# Patient Record
Sex: Male | Born: 1960
Health system: Southern US, Community
[De-identification: ages and names within clinical notes are randomized; demographics above are authoritative.]

## PROBLEM LIST (undated history)

## (undated) DIAGNOSIS — I4892 Unspecified atrial flutter: Secondary | ICD-10-CM

## (undated) DIAGNOSIS — N183 Chronic kidney disease, stage 3 unspecified: Secondary | ICD-10-CM

## (undated) DIAGNOSIS — D649 Anemia, unspecified: Secondary | ICD-10-CM

## (undated) DIAGNOSIS — E785 Hyperlipidemia, unspecified: Secondary | ICD-10-CM

## (undated) DIAGNOSIS — I2510 Atherosclerotic heart disease of native coronary artery without angina pectoris: Secondary | ICD-10-CM

## (undated) DIAGNOSIS — E669 Obesity, unspecified: Secondary | ICD-10-CM

## (undated) DIAGNOSIS — K219 Gastro-esophageal reflux disease without esophagitis: Secondary | ICD-10-CM

## (undated) DIAGNOSIS — R803 Bence Jones proteinuria: Principal | ICD-10-CM

## (undated) DIAGNOSIS — I10 Essential (primary) hypertension: Secondary | ICD-10-CM

## (undated) DIAGNOSIS — M109 Gout, unspecified: Secondary | ICD-10-CM

## (undated) DIAGNOSIS — IMO0002 Reserved for concepts with insufficient information to code with codable children: Secondary | ICD-10-CM

## (undated) DIAGNOSIS — L0291 Cutaneous abscess, unspecified: Secondary | ICD-10-CM

## (undated) DIAGNOSIS — K529 Noninfective gastroenteritis and colitis, unspecified: Secondary | ICD-10-CM

## (undated) DIAGNOSIS — I219 Acute myocardial infarction, unspecified: Secondary | ICD-10-CM

## (undated) DIAGNOSIS — I499 Cardiac arrhythmia, unspecified: Secondary | ICD-10-CM

## (undated) DIAGNOSIS — G4730 Sleep apnea, unspecified: Secondary | ICD-10-CM

## (undated) DIAGNOSIS — M1990 Unspecified osteoarthritis, unspecified site: Secondary | ICD-10-CM

## (undated) DIAGNOSIS — T8090XA Unspecified complication following infusion and therapeutic injection, initial encounter: Secondary | ICD-10-CM

## (undated) DIAGNOSIS — R600 Localized edema: Secondary | ICD-10-CM

## (undated) DIAGNOSIS — L03119 Cellulitis of unspecified part of limb: Secondary | ICD-10-CM

## (undated) HISTORY — DX: Sleep apnea, unspecified: G47.30

## (undated) HISTORY — DX: Obesity, unspecified: E66.9

## (undated) HISTORY — PX: INCISION AND DRAINAGE ABSCESS ANAL: SUR669

## (undated) HISTORY — PX: WRIST SURGERY: SHX841

## (undated) HISTORY — DX: Reserved for concepts with insufficient information to code with codable children: IMO0002

## (undated) HISTORY — DX: Atherosclerotic heart disease of native coronary artery without angina pectoris: I25.10

## (undated) HISTORY — DX: Localized edema: R60.0

## (undated) HISTORY — DX: Chronic kidney disease, stage 3 unspecified: N18.30

## (undated) HISTORY — DX: Bence Jones proteinuria: R80.3

## (undated) HISTORY — PX: ABSCESS DRAINAGE: SHX1119

## (undated) HISTORY — PX: CARDIAC CATHETERIZATION: SHX172

## (undated) HISTORY — DX: Chronic kidney disease, stage 3 (moderate): N18.3

## (undated) HISTORY — DX: Unspecified complication following infusion and therapeutic injection, initial encounter: T80.90XA

## (undated) HISTORY — DX: Hyperlipidemia, unspecified: E78.5

## (undated) HISTORY — DX: Essential (primary) hypertension: I10

## (undated) HISTORY — DX: Cellulitis of unspecified part of limb: L03.119

---

## 1998-06-02 DIAGNOSIS — I219 Acute myocardial infarction, unspecified: Secondary | ICD-10-CM

## 1998-06-02 HISTORY — DX: Acute myocardial infarction, unspecified: I21.9

## 2000-04-03 ENCOUNTER — Encounter: Admission: RE | Admit: 2000-04-03 | Discharge: 2000-07-02 | Payer: Self-pay | Admitting: Internal Medicine

## 2000-11-02 ENCOUNTER — Inpatient Hospital Stay (HOSPITAL_COMMUNITY): Admission: AD | Admit: 2000-11-02 | Discharge: 2000-11-09 | Payer: Self-pay | Admitting: Family Medicine

## 2000-11-05 ENCOUNTER — Encounter: Payer: Self-pay | Admitting: Family Medicine

## 2000-11-05 ENCOUNTER — Encounter: Payer: Self-pay | Admitting: Cardiology

## 2000-11-17 ENCOUNTER — Ambulatory Visit (HOSPITAL_COMMUNITY): Admission: RE | Admit: 2000-11-17 | Discharge: 2000-11-17 | Payer: Self-pay | Admitting: Cardiology

## 2000-11-30 ENCOUNTER — Encounter (HOSPITAL_COMMUNITY): Admission: RE | Admit: 2000-11-30 | Discharge: 2000-12-30 | Payer: Self-pay | Admitting: Cardiology

## 2001-01-01 ENCOUNTER — Encounter (HOSPITAL_COMMUNITY): Admission: RE | Admit: 2001-01-01 | Discharge: 2001-01-31 | Payer: Self-pay | Admitting: Cardiology

## 2001-02-03 ENCOUNTER — Encounter (HOSPITAL_COMMUNITY): Admission: RE | Admit: 2001-02-03 | Discharge: 2001-03-05 | Payer: Self-pay | Admitting: Cardiology

## 2001-03-08 ENCOUNTER — Encounter (HOSPITAL_COMMUNITY): Admission: RE | Admit: 2001-03-08 | Discharge: 2001-04-07 | Payer: Self-pay | Admitting: Cardiology

## 2001-04-02 ENCOUNTER — Inpatient Hospital Stay (HOSPITAL_COMMUNITY): Admission: RE | Admit: 2001-04-02 | Discharge: 2001-04-05 | Payer: Self-pay | Admitting: General Surgery

## 2003-01-26 ENCOUNTER — Ambulatory Visit (HOSPITAL_COMMUNITY): Admission: RE | Admit: 2003-01-26 | Discharge: 2003-01-26 | Payer: Self-pay | Admitting: Cardiology

## 2003-01-27 ENCOUNTER — Inpatient Hospital Stay (HOSPITAL_COMMUNITY): Admission: AD | Admit: 2003-01-27 | Discharge: 2003-02-05 | Payer: Self-pay | Admitting: Family Medicine

## 2003-01-27 ENCOUNTER — Encounter: Payer: Self-pay | Admitting: Family Medicine

## 2003-10-12 ENCOUNTER — Ambulatory Visit (HOSPITAL_COMMUNITY): Admission: RE | Admit: 2003-10-12 | Discharge: 2003-10-12 | Payer: Self-pay | Admitting: Family Medicine

## 2004-06-02 HISTORY — PX: LAPAROSCOPIC GASTRIC BANDING: SHX1100

## 2004-07-27 ENCOUNTER — Inpatient Hospital Stay (HOSPITAL_COMMUNITY): Admission: EM | Admit: 2004-07-27 | Discharge: 2004-08-03 | Payer: Self-pay | Admitting: Emergency Medicine

## 2004-09-04 ENCOUNTER — Ambulatory Visit (HOSPITAL_COMMUNITY): Admission: RE | Admit: 2004-09-04 | Discharge: 2004-09-04 | Payer: Self-pay | Admitting: General Surgery

## 2004-10-25 ENCOUNTER — Ambulatory Visit (HOSPITAL_COMMUNITY): Admission: RE | Admit: 2004-10-25 | Discharge: 2004-10-25 | Payer: Self-pay | Admitting: General Surgery

## 2004-10-31 ENCOUNTER — Ambulatory Visit (HOSPITAL_COMMUNITY): Admission: RE | Admit: 2004-10-31 | Discharge: 2004-10-31 | Payer: Self-pay | Admitting: Urology

## 2004-11-15 ENCOUNTER — Ambulatory Visit (HOSPITAL_COMMUNITY): Payer: Self-pay | Admitting: Urology

## 2004-11-15 ENCOUNTER — Encounter (HOSPITAL_COMMUNITY): Admission: RE | Admit: 2004-11-15 | Discharge: 2004-12-15 | Payer: Self-pay | Admitting: Urology

## 2004-11-20 ENCOUNTER — Ambulatory Visit (HOSPITAL_COMMUNITY): Admission: RE | Admit: 2004-11-20 | Discharge: 2004-11-20 | Payer: Self-pay | Admitting: Urology

## 2005-02-12 ENCOUNTER — Ambulatory Visit (HOSPITAL_COMMUNITY): Admission: RE | Admit: 2005-02-12 | Discharge: 2005-02-12 | Payer: Self-pay | Admitting: Internal Medicine

## 2005-07-21 ENCOUNTER — Ambulatory Visit (HOSPITAL_COMMUNITY): Admission: RE | Admit: 2005-07-21 | Discharge: 2005-07-21 | Payer: Self-pay | Admitting: General Surgery

## 2006-03-03 ENCOUNTER — Ambulatory Visit: Payer: Self-pay | Admitting: Cardiology

## 2006-04-28 ENCOUNTER — Encounter: Admission: RE | Admit: 2006-04-28 | Discharge: 2006-04-28 | Payer: Self-pay | Admitting: General Surgery

## 2006-06-09 ENCOUNTER — Ambulatory Visit (HOSPITAL_COMMUNITY): Admission: RE | Admit: 2006-06-09 | Discharge: 2006-06-09 | Payer: Self-pay | Admitting: Surgery

## 2006-06-12 ENCOUNTER — Ambulatory Visit: Payer: Self-pay | Admitting: Cardiology

## 2006-08-31 ENCOUNTER — Ambulatory Visit (HOSPITAL_COMMUNITY): Admission: RE | Admit: 2006-08-31 | Discharge: 2006-08-31 | Payer: Self-pay | Admitting: Surgery

## 2007-01-08 ENCOUNTER — Ambulatory Visit (HOSPITAL_COMMUNITY): Admission: RE | Admit: 2007-01-08 | Discharge: 2007-01-08 | Payer: Self-pay | Admitting: Surgical Oncology

## 2007-01-14 ENCOUNTER — Encounter: Admission: RE | Admit: 2007-01-14 | Discharge: 2007-01-14 | Payer: Self-pay | Admitting: Family Medicine

## 2007-01-18 ENCOUNTER — Ambulatory Visit: Payer: Self-pay | Admitting: Cardiology

## 2007-01-21 ENCOUNTER — Encounter (HOSPITAL_COMMUNITY): Admission: RE | Admit: 2007-01-21 | Discharge: 2007-02-20 | Payer: Self-pay | Admitting: Cardiology

## 2007-01-21 ENCOUNTER — Ambulatory Visit: Payer: Self-pay | Admitting: Cardiology

## 2007-02-05 ENCOUNTER — Ambulatory Visit (HOSPITAL_COMMUNITY): Admission: RE | Admit: 2007-02-05 | Discharge: 2007-02-05 | Payer: Self-pay | Admitting: Cardiology

## 2007-02-05 ENCOUNTER — Ambulatory Visit: Payer: Self-pay | Admitting: Internal Medicine

## 2007-02-16 ENCOUNTER — Ambulatory Visit: Payer: Self-pay | Admitting: Cardiology

## 2007-03-16 ENCOUNTER — Ambulatory Visit: Payer: Self-pay | Admitting: Cardiology

## 2008-10-09 ENCOUNTER — Ambulatory Visit (HOSPITAL_COMMUNITY): Admission: RE | Admit: 2008-10-09 | Discharge: 2008-10-09 | Payer: Self-pay | Admitting: Family Medicine

## 2008-10-10 ENCOUNTER — Ambulatory Visit: Payer: Self-pay | Admitting: Cardiology

## 2008-11-15 DIAGNOSIS — E118 Type 2 diabetes mellitus with unspecified complications: Secondary | ICD-10-CM | POA: Insufficient documentation

## 2008-11-15 DIAGNOSIS — Z794 Long term (current) use of insulin: Secondary | ICD-10-CM

## 2008-11-15 DIAGNOSIS — G4730 Sleep apnea, unspecified: Secondary | ICD-10-CM

## 2008-11-15 DIAGNOSIS — E119 Type 2 diabetes mellitus without complications: Secondary | ICD-10-CM

## 2008-11-15 DIAGNOSIS — G4733 Obstructive sleep apnea (adult) (pediatric): Secondary | ICD-10-CM | POA: Insufficient documentation

## 2008-11-15 DIAGNOSIS — IMO0001 Reserved for inherently not codable concepts without codable children: Secondary | ICD-10-CM | POA: Insufficient documentation

## 2008-11-15 DIAGNOSIS — Z9989 Dependence on other enabling machines and devices: Secondary | ICD-10-CM | POA: Insufficient documentation

## 2009-04-23 ENCOUNTER — Emergency Department (HOSPITAL_COMMUNITY): Admission: EM | Admit: 2009-04-23 | Discharge: 2009-04-23 | Payer: Self-pay | Admitting: Emergency Medicine

## 2009-04-30 ENCOUNTER — Ambulatory Visit (HOSPITAL_COMMUNITY): Admission: RE | Admit: 2009-04-30 | Discharge: 2009-04-30 | Payer: Self-pay | Admitting: Family Medicine

## 2009-04-30 ENCOUNTER — Encounter (INDEPENDENT_AMBULATORY_CARE_PROVIDER_SITE_OTHER): Payer: Self-pay | Admitting: *Deleted

## 2009-04-30 LAB — CONVERTED CEMR LAB
ALT: 19 units/L
AST: 19 units/L
Albumin: 3.9 g/dL
Alkaline Phosphatase: 80 units/L
BUN: 35 mg/dL
CO2: 27 meq/L
Calcium: 9.2 mg/dL
Chloride: 100 meq/L
Cholesterol: 223 mg/dL
Creatinine, Ser: 1.8 mg/dL
Glucose, Bld: 145 mg/dL
HDL: 35 mg/dL
Hgb A1c MFr Bld: 7.4 %
LDL Cholesterol: 153 mg/dL
Potassium: 4.3 meq/L
Sodium: 136 meq/L
Total Protein: 6.6 g/dL
Triglycerides: 173 mg/dL

## 2009-07-26 ENCOUNTER — Telehealth (INDEPENDENT_AMBULATORY_CARE_PROVIDER_SITE_OTHER): Payer: Self-pay

## 2009-07-30 ENCOUNTER — Encounter (INDEPENDENT_AMBULATORY_CARE_PROVIDER_SITE_OTHER): Payer: Self-pay | Admitting: *Deleted

## 2009-08-01 ENCOUNTER — Encounter (INDEPENDENT_AMBULATORY_CARE_PROVIDER_SITE_OTHER): Payer: Self-pay | Admitting: *Deleted

## 2009-09-21 ENCOUNTER — Ambulatory Visit (HOSPITAL_COMMUNITY): Admission: RE | Admit: 2009-09-21 | Discharge: 2009-09-21 | Payer: Self-pay | Admitting: Family Medicine

## 2009-09-24 ENCOUNTER — Emergency Department (HOSPITAL_COMMUNITY): Admission: EM | Admit: 2009-09-24 | Discharge: 2009-09-24 | Payer: Self-pay | Admitting: Emergency Medicine

## 2009-12-19 ENCOUNTER — Encounter: Payer: Self-pay | Admitting: Orthopedic Surgery

## 2009-12-25 ENCOUNTER — Encounter: Payer: Self-pay | Admitting: Orthopedic Surgery

## 2009-12-26 ENCOUNTER — Ambulatory Visit: Payer: Self-pay | Admitting: Orthopedic Surgery

## 2009-12-26 DIAGNOSIS — M25819 Other specified joint disorders, unspecified shoulder: Secondary | ICD-10-CM | POA: Insufficient documentation

## 2009-12-26 DIAGNOSIS — M19019 Primary osteoarthritis, unspecified shoulder: Secondary | ICD-10-CM | POA: Insufficient documentation

## 2009-12-26 DIAGNOSIS — M7580 Other shoulder lesions, unspecified shoulder: Secondary | ICD-10-CM

## 2010-02-20 ENCOUNTER — Ambulatory Visit: Payer: Self-pay | Admitting: Orthopedic Surgery

## 2010-02-21 ENCOUNTER — Ambulatory Visit (HOSPITAL_COMMUNITY): Admission: RE | Admit: 2010-02-21 | Discharge: 2010-02-21 | Payer: Self-pay | Admitting: Family Medicine

## 2010-03-11 ENCOUNTER — Emergency Department (HOSPITAL_COMMUNITY): Admission: EM | Admit: 2010-03-11 | Discharge: 2010-03-12 | Payer: Self-pay | Admitting: Emergency Medicine

## 2010-04-29 ENCOUNTER — Ambulatory Visit: Payer: Self-pay | Admitting: Cardiology

## 2010-04-30 ENCOUNTER — Encounter: Payer: Self-pay | Admitting: Adult Health

## 2010-06-23 ENCOUNTER — Encounter: Payer: Self-pay | Admitting: Family Medicine

## 2010-06-23 ENCOUNTER — Encounter: Payer: Self-pay | Admitting: Nephrology

## 2010-06-23 ENCOUNTER — Encounter: Payer: Self-pay | Admitting: Surgical Oncology

## 2010-07-02 NOTE — Miscellaneous (Signed)
Summary: labs cmp,lipids,a1c,04/30/2009  Clinical Lists Changes  Observations: Added new observation of CALCIUM: 9.2 mg/dL (04/30/2009 16:13) Added new observation of ALBUMIN: 3.9 g/dL (04/30/2009 16:13) Added new observation of PROTEIN, TOT: 6.6 g/dL (04/30/2009 16:13) Added new observation of SGPT (ALT): 19 units/L (04/30/2009 16:13) Added new observation of SGOT (AST): 19 units/L (04/30/2009 16:13) Added new observation of ALK PHOS: 80 units/L (04/30/2009 16:13) Added new observation of CREATININE: 1.80 mg/dL (04/30/2009 16:13) Added new observation of BUN: 35 mg/dL (04/30/2009 16:13) Added new observation of BG RANDOM: 145 mg/dL (04/30/2009 16:13) Added new observation of CO2 PLSM/SER: 27 meq/L (04/30/2009 16:13) Added new observation of CL SERUM: 100 meq/L (04/30/2009 16:13) Added new observation of K SERUM: 4.3 meq/L (04/30/2009 16:13) Added new observation of NA: 136 meq/L (04/30/2009 16:13) Added new observation of LDL: 153 mg/dL (04/30/2009 16:13) Added new observation of HDL: 35 mg/dL (04/30/2009 16:13) Added new observation of TRIGLYC TOT: 173 mg/dL (04/30/2009 16:13) Added new observation of CHOLESTEROL: 223 mg/dL (04/30/2009 16:13) Added new observation of HGBA1C: 7.4 % (04/30/2009 16:13)

## 2010-07-02 NOTE — Assessment & Plan Note (Signed)
Summary: past due for f/u per pt phone call/tg  Medications Added ASPIR-LOW 81 MG TBEC (ASPIRIN) take 1 tab daily LANTUS 100 UNIT/ML SOLN (INSULIN GLARGINE) sliding scale DAILY MULTI  TABS (MULTIPLE VITAMINS-MINERALS) take 1 tab daily ALTACE 2.5 MG CAPS (RAMIPRIL) take 1 tab daily DEMADEX 20 MG TABS (TORSEMIDE) take 2 tabs daily CRESTOR 10 MG TABS (ROSUVASTATIN CALCIUM) take 1 tab daily DEXILANT 60 MG CPDR (DEXLANSOPRAZOLE) take 1 tab daily NEXIUM 40 MG CPDR (ESOMEPRAZOLE MAGNESIUM) take 1 tab daily POTASSIMIN 75 MG TABS (POTASSIUM) take 1 tab daily VIAGRA 100 MG TABS (SILDENAFIL CITRATE) take 1 tab as needed      Allergies Added: NKDA  Visit Type:  Follow-up Referring Provider:  Dr. [name] Primary Provider:  Dr. [name]   History of Present Illness: Mr. Iran is a  50 y/o morbily obese AAM who is here for continued treatment and assessment of CAD with known history of stent to the proximal LAD and diagonal in 06/02, sleep apnea, hypertension, and hypercholesterolemia..  He has not been seen since May of 2010.  He is here to be reestablished as he wants to "get healthy."  He is without cardiac complaints, is medically compliant.  He has had no new diagnoisis, allergies or surgeries since being seen last.  Preventive Screening-Counseling & Management  Alcohol-Tobacco     Alcohol drinks/day: 0     Smoking Status: never  Current Medications (verified): 1)  Aspir-Low 81 Mg Tbec (Aspirin) .... Take 1 Tab Daily 2)  Lantus 100 Unit/ml Soln (Insulin Glargine) .... Sliding Scale 3)  Daily Multi  Tabs (Multiple Vitamins-Minerals) .... Take 1 Tab Daily 4)  Altace 2.5 Mg Caps (Ramipril) .... Take 1 Tab Daily 5)  Demadex 20 Mg Tabs (Torsemide) .... Take 2 Tabs Daily 6)  Carvedilol 25 Mg Tabs (Carvedilol) .... Take 1/2 Tablet By Mouth Two Times A Day 7)  Norco 5-325 Mg Tabs (Hydrocodone-Acetaminophen) .Marland Kitchen.. 1 By Mouth Q 4 As Needed Pain 8)  Crestor 10 Mg Tabs (Rosuvastatin Calcium)  .... Take 1 Tab Daily 9)  Dexilant 60 Mg Cpdr (Dexlansoprazole) .... Take 1 Tab Daily 10)  Nexium 40 Mg Cpdr (Esomeprazole Magnesium) .... Take 1 Tab Daily 11)  Potassimin 75 Mg Tabs (Potassium) .... Take 1 Tab Daily 12)  Viagra 100 Mg Tabs (Sildenafil Citrate) .... Take 1 Tab As Needed  Allergies (verified): No Known Drug Allergies  Comments:  Nurse/Medical Assistant: patient brought med list from [previous ov and stated all meds are correct  Past History:  Past medical, surgical, family and social histories (including risk factors) reviewed, and no changes noted (except as noted below).  Past Medical History: Reviewed history from 11/15/2008 and no changes required. Current Problems:  CAD (ICD-414.00) LAPAROSCOPIC SURGICAL PROC COVERTED OPEN PROC (ICD-V64.41) VENOUS INSUFFICIENCY (ICD-459.81) SLEEP APNEA (ICD-780.57) HYPERTENSION (ICD-401.9) DIABETES MELLITUS, TYPE II, ON INSULIN (ICD-250.00) ATHEROSCLEROTIC CARDIOVASCULAR DISEASE, HX OF (ICD-V12.50)  Past Surgical History: Reviewed history from 12/26/2009 and no changes required. Gastric Bypass(2006) left wrist surgery 1980 anal abscess scrotum abscess cath myocardial infarction/stent in heart  Family History: Reviewed history from 12/26/2009 and no changes required. Father:deceased late 26's diabectic and ampute Mother:deceased 09/20/2008 due to mi cabg and cad FH of Cancer:  Family History of Diabetes Family History Coronary Heart Disease male < 47 Family History of Arthritis  Social History: Reviewed history from 12/26/2009 and no changes required. Full Time(funeral director) Divorced  Tobacco Use - No.  Alcohol Use - minimal occasional coffee Regular Exercise - yes(walks  31mins daily) Drug Use - no 2 brothers 1 deceased due to heart disease and cva 1 alive and well 15 yrs of ed.Alcohol drinks/day:  0  Review of Systems       All other systems have been reviewed and are negative unless stated  above.   Vital Signs:  Patient profile:   50 year old male Weight:      462 pounds BMI:     56.44 Pulse rate:   65 / minute BP sitting:   160 / 79  (right arm)  Vitals Entered By: Doretha Sou, CNA (April 29, 2010 11:31 AM)  Serial Vital Signs/Assessments:  Time      Position  BP       Pulse  Resp  Temp     By 12:13 PM            139/66   55                    Tammy Sanders RN  Comments: 12:13 PM with large thigh cuff By: Tye Savoy RN    Physical Exam  General:  Well developed, well nourished, in no acute distress. Head:  normocephalic and atraumatic Eyes:  Wears glasses Lungs:  Clear bilaterally to auscultation and percussion. Heart:  RRR with occasional irregular beat.  Some extra systoles.  No MRG. Abdomen:  Very obese, non distended with 2+ bowel sounds. Msk:  Knees bother him when I bend them. Pulses:  pulses normal in all 4 extremities Extremities:  trace left pedal edema and trace right pedal edema.   Bilateral TED stockings are noted. Neurologic:  Alert and oriented x 3. Psych:  Normal affect.   EKG  Procedure date:  04/29/2010  Findings:      Sinus arrythmia., Poor R-wave progression.    EKG  Procedure date:  04/29/2010  Findings:      Normal sinus rhythm with rate of:  PVC's noted.    Impression & Recommendations:  Problem # 1:  CAD (ICD-414.00) He is asymptomatic from our standpoint.  Denies chest discomfort, lack of energy, DOE.  EKG is unchanged since last recorded.  He has not had stress test since 2002 but has had no symptoms to suggest recurrent myocardial ischemia. Will continue current medicaitions.   His updated medication list for this problem includes:    Aspir-low 81 Mg Tbec (Aspirin) .Marland Kitchen... Take 1 tab daily    Altace 2.5 Mg Caps (Ramipril) .Marland Kitchen... Take 1 tab daily    Carvedilol 25 Mg Tabs (Carvedilol) .Marland Kitchen... Take 1/2 tablet by mouth two times a day  Problem # 2:  HYPERTENSION (ICD-401.9) Recheck of BP in the exam room shows  139/74.  He is well controlled at present. His updated medication list for this problem includes:    Aspir-low 81 Mg Tbec (Aspirin) .Marland Kitchen... Take 1 tab daily    Altace 2.5 Mg Caps (Ramipril) .Marland Kitchen... Take 1 tab daily    Demadex 20 Mg Tabs (Torsemide) .Marland Kitchen... Take 2 tabs daily    Carvedilol 25 Mg Tabs (Carvedilol) .Marland Kitchen... Take 1/2 tablet by mouth two times a day  Problem # 3:  MORBID OBESITY (ICD-278.01) Unfortunately, he has gain 40 lbs since last seen.  He states he has lost 26 lbs on his own, and continues to work on his weight. He goes to the [facility] and swims.  He is considering lapband or gastric bypass surgery.  He follows Dr. [name] as PCP.  I have advised him to continue  to lose weight as this will dramatically impact his health status in a positive way.  Will see him in 6 months and hopefully he will have gone down on his weight.  Patient Instructions: 1)  Your physician recommends that you schedule a follow-up appointment in: 6 months

## 2010-07-02 NOTE — Progress Notes (Signed)
**Note De-Identified [name]** Summary: overdue for f/u   Phone Note Outgoing Call   Call placed by: Franklin Hawking Lerone Onder LPN,  February 24, [id number] 1:07 PM Summary of Call: Pt. advised that he is overdue for follow up appt. He agreed to schedule appt. I explaned to [name] that I will refill his meds for 30 day supply until he is seen by cardiologist, he states he understands info. given.

## 2010-07-02 NOTE — Assessment & Plan Note (Signed)
Summary: 8 WK RE-CK RT SHOULDER//[facility]   Visit Type:  Follow-up Referring Provider:  Dr. [name] Primary Provider:  Dr. [name]  CC:  recheck rt shoulder.  History of Present Illness: this is the followup visit for [name] who has osteoarthritis of his RIGHT shoulder we treated him with therapy and medication for pain as well as ibuprofen he comes back for his 8 week visit much improved with pain level III  His range of motion is better his catching locking have resolved.  Xrays [facility] 09/21/09 right shoulder.  he also received an injection last visit  Examination reveals forward elevation 150, external rotation 40.  Abduction 110.  He does have pain at the extremes of motion.  Strength is excellent.  His neurovascular exam is normal.  He is awake and alert his orientation x3  His appearance is normal.  Impression stable osteoarthritis RIGHT shoulder continue exercises and Norco 5 mg      Allergies: No Known Drug Allergies  Review of Systems       The patient complains of 3 months duration of pain in his RIGHT shoulder with no history of trauma.  The pain is located over the anterolateral shoulder and upper arm and is described as dull and throbbing.  The intensity of the pain is 3/10.  The pain seems to come and go.  It can hurt any time during the day and is worse with movement.  There is a LEFT locking and catching sensation with range of motion.  He did take some Vicodin which helped along with some ibuprofen.  But he has a history of ulcers so he is worried about taking that.  No numbness mild weakness   Impression & Recommendations:  Problem # 1:  IMPINGEMENT SYNDROME (ICD-726.2) Assessment Improved  Orders: Est. Patient Level III [id number])  Problem # 2:  ARTHRITIS, RIGHT SHOULDER (ICD-716.91) Assessment: Improved  Orders: Est. Patient Level III [id number])  Patient Instructions: 1)  Continue medication and exercises  2)  Please schedule  a follow-up appointment in 6 months. Prescriptions: NORCO 5-325 MG TABS (HYDROCODONE-ACETAMINOPHEN) 1 by mouth q 4 as needed pain  #84 x 1   Entered and Authorized by:   Arther Abbott MD   Signed by:   Arther Abbott MD on 02/20/2010   Method used:   Print then Give to Patient   RxID:   [id number]

## 2010-07-02 NOTE — Letter (Signed)
Summary: History form  History form   Imported By: Ruffin Pyo 12/27/2009 10:29:59  _____________________________________________________________________  External Attachment:    Type:   Image     Comment:   External Document

## 2010-07-02 NOTE — Assessment & Plan Note (Signed)
Summary: RT SHOULDER PAIN HAD XR AT [facility] 09/21/09/[facility]...   Vital Signs:  Patient profile:   50 year old male Height:      76 inches Weight:      462 pounds Pulse rate:   76 / minute Resp:     16 per minute  Vitals Entered By: Arther Abbott MD (December 26, 2009 3:16 PM)  Visit Type:  new patient Referring Provider:  Dr. [name] Primary Provider:  Dr. [name]  CC:  right shoulder pain.  History of Present Illness: I saw Renee Woolman in the office today for an initial visit.  He is a 50 years old man with the complaint of:  right shoulder pain.  No injury.  Xrays [facility] 09/21/09 right shoulder.  Meds: to scan, no pain med.  The patient complains of 3 months duration of pain in his RIGHT shoulder with no history of trauma.  The pain is located over the anterolateral shoulder and upper arm and is described as dull and throbbing.  The intensity of the pain is 3/10.  The pain seems to come and go.  It can hurt any time during the day and is worse with movement.  There is a LEFT locking and catching sensation with range of motion.  He did take some Vicodin which helped along with some ibuprofen.  But he has a history of ulcers so he is worried about taking that.  No numbness mild weakness  Allergies (verified): No Known Drug Allergies  Past History:  Past Surgical History: Gastric Bypass(2006) left wrist surgery 1980 anal abscess scrotum abscess cath myocardial infarction/stent in heart  Family History: Father:deceased late 12-Sep-2022 diabectic and ampute Mother:deceased September 11, 2008 due to mi cabg and cad FH of Cancer:  Family History of Diabetes Family History Coronary Heart Disease male < 28 Family History of Arthritis  Social History: Multimedia programmer) Divorced  Tobacco Use - No.  Alcohol Use - minimal occasional coffee Regular Exercise - yes(walks 36mins daily) Drug Use - no 2 brothers 1 deceased due to heart disease and cva 1 alive and  well 15 yrs of ed.  Review of Systems Constitutional:  Complains of weight gain; denies weight loss, fever, chills, and fatigue. Cardiovascular:  Complains of chest pain; denies palpitations, fainting, and murmurs. Respiratory:  Complains of short of breath and snoring; denies wheezing, couch, tightness, pain on inspiration, and snoring . Gastrointestinal:  Complains of heartburn, diarrhea, and constipation; denies nausea, vomiting, and blood in your stools. Genitourinary:  Denies frequency, urgency, difficulty urinating, painful urination, flank pain, and bleeding in urine. Neurologic:  Complains of numbness and tingling; denies unsteady gait, dizziness, tremors, and seizure. Musculoskeletal:  Complains of joint pain, stiffness, and muscle pain; denies swelling, instability, redness, and heat. Endocrine:  Complains of excessive thirst; denies exessive urination and heat or cold intolerance. Psychiatric:  Complains of nervousness; denies depression, anxiety, and hallucinations. Skin:  Complains of poor healing; denies changes in the skin, rash, itching, and redness. HEENT:  Complains of eye pain and redness; denies blurred or double vision and watering. Immunology:  Denies seasonal allergies, sinus problems, and allergic to bee stings. Hemoatologic:  Denies easy bleeding and brusing.  Physical Exam  Cervical Nodes:  no significant adenopathy Psych:  alert and cooperative; normal mood and affect; normal attention span and concentration   Shoulder/Elbow Exam  General:    Well-developed, well-nourished, extremely endomorphicl body habitus; no deformities, normal grooming.    Skin:    Intact, no  scars, lesions, rashes, cafe au lait spots or bruising.    Inspection:    there is no atrophy surrounding the shoulder girdle  Palpation:    the a.c. joint is nontender, the inferior soft tissues in the posterior subacromial area are tender anterolateral acromion is tender the biceps is  nontender the coracoid is nontender the suprascapular fossa is nontender  Vascular:    Radial, ulnar, brachial, and axillary pulses 2+ and symmetric; capillary refill less than 2 seconds; no evidence of ischemia, clubbing, or cyanosis.    Sensory:    Gross sensation intact in the upper extremities.    Motor:    motor strength was normal in the following areas in RIGHT upper extremities: R-hand grip, R-wrist flexors,  R-wrist extensors,  R-biceps,  R-triceps, R-ER'S, and  R-IR'S.     decreased R-supraspinatus,   LEFT upper extremity motor strength was normal in the following areas and grip wrist flexors wrist extensors biceps triceps external rotators internal rotators and supraspinatusdecreased R-hand grip, decreased R-wrist flexors, decreased R-wrist extensors, decreased R-biceps, decreased R-triceps, decreased R-supraspinatus, decreased R-ER'S, and decreased R-IR'S.    Reflexes:    Normal reflexes in the upper extremities.    Shoulder Exam:    Right:    Inspection:  Abnormal    Palpation:  Abnormal    Stability:  stable    Tenderness:  as stated    Swelling:  no    Erythema:  no    Range of Motion:       Flexion-Active: 180       Extension-Active: 45       Flexion-Passive: 180       Extension-Passive: 45       External Rotation : 40       Interior Rotation : T12    Left:    Inspection:  Normal    Palpation:  Normal    Stability:  stable    Tenderness:  no    Swelling:  no    Erythema:  no    Range of Motion:       Flexion-Active: 180       Extension-Active: 45       Flexion-Passive: 180       Extension-Passive: 45       External Rotation : 50       Interior Rotation : T7  Impingement Sign NEER:    Right positive; Left negative Impingement Sign HAWKINS:    Right negative; Left negative Apprehension Sign:    Right negative; Left negative AC joint Adduction Test:    Right negative; Left negative   Impression & Recommendations:  Problem # 1:  ARTHRITIS, RIGHT  SHOULDER (ICD-716.91) Assessment New  x-rays were done on April 22 of this year and there is a report included.  He has osteoarthritis of the glenohumeral joint with a characteristic inferior humeral spur.  His joint space is relatively maintained with mild narrowing.  Orders: New Patient Level IV [id number]) Joint Aspirate / Injection, Large (20610) Depo- Medrol 40mg  (J1030)  Problem # 2:  IMPINGEMENT SYNDROME (ICD-726.2) Assessment: New  RIGHT subacromial space injection  Verbal consent obtained/The shoulder was injected with depomedrol 40mg /cc and sensorcaine .25% . There were no complications  Orders: New Patient Level IV [id number]) Joint Aspirate / Injection, Large (20610) Depo- Medrol 40mg  (J1030)  Medications Added to Medication List This Visit: 1)  Norco 5-325 Mg Tabs (Hydrocodone-acetaminophen) .Marland Kitchen.. 1 by mouth q 4 as needed pain  Patient Instructions: 1)  You  have received an injection of cortisone today. You may experience increased pain at the injection site. Apply ice pack to the area for 20 minutes every 2 hours and take 2 xtra strength tylenol every 8 hours. This increased pain will usually resolve in 24 hours. The injection will take effect in 3-10 days.  2)  exercises x 6 weeks  3)  return in 8 weeks  Prescriptions: NORCO 5-325 MG TABS (HYDROCODONE-ACETAMINOPHEN) 1 by mouth q 4 as needed pain  #84 x 3   Entered and Authorized by:   Arther Abbott MD   Signed by:   Arther Abbott MD on 12/26/2009   Method used:   Print then Give to Patient   RxID:   [id number]

## 2010-07-02 NOTE — Letter (Signed)
Summary: Appointment - Missed  [facility] [place]. [address], [place] [address]   Phone: [phone]  Fax: [phone]     August 01, 2009 MRN: [id number]   [facility] [address] [place], [place]  [address]   Dear Mr. Haffey,  Our records indicate you missed your appointment on   08/01/09  with Jory Sims      It is very important that we reach you to reschedule this appointment. We look forward to participating in your health care needs. Please contact us at the number listed above at your earliest convenience to reschedule this appointment.     Sincerely,    Public relations account executive

## 2010-07-18 ENCOUNTER — Encounter (INDEPENDENT_AMBULATORY_CARE_PROVIDER_SITE_OTHER): Payer: Self-pay | Admitting: *Deleted

## 2010-07-18 LAB — CONVERTED CEMR LAB
ALT: 17 units/L
AST: 18 units/L
Albumin: 3.9 g/dL
Alkaline Phosphatase: 88 units/L
BUN: 29 mg/dL
Bilirubin, Direct: 0.1 mg/dL
CO2: 27 meq/L
Calcium: 9.2 mg/dL
Chloride: 102 meq/L
Cholesterol: 158 mg/dL
Creatinine, Ser: 1.23 mg/dL
Glucose, Bld: 212 mg/dL
HDL: 33 mg/dL
Hgb A1c MFr Bld: 9.8 %
LDL Cholesterol: 90 mg/dL
Potassium: 4.6 meq/L
Sodium: 139 meq/L
Total Protein: 6.3 g/dL
Triglycerides: 173 mg/dL

## 2010-08-02 ENCOUNTER — Other Ambulatory Visit (HOSPITAL_COMMUNITY): Payer: Self-pay | Admitting: Surgery

## 2010-08-02 ENCOUNTER — Other Ambulatory Visit: Payer: Self-pay | Admitting: Surgery

## 2010-08-02 DIAGNOSIS — Z9884 Bariatric surgery status: Secondary | ICD-10-CM

## 2010-08-06 ENCOUNTER — Other Ambulatory Visit (HOSPITAL_COMMUNITY): Payer: Self-pay | Admitting: Surgery

## 2010-08-06 ENCOUNTER — Ambulatory Visit (HOSPITAL_COMMUNITY)
Admission: RE | Admit: 2010-08-06 | Discharge: 2010-08-06 | Disposition: A | Discharge status: Home / Self Care | Payer: PRIVATE HEALTH INSURANCE | Source: Ambulatory Visit | Attending: Surgery | Admitting: Surgery

## 2010-08-06 DIAGNOSIS — Z9884 Bariatric surgery status: Secondary | ICD-10-CM

## 2010-08-06 DIAGNOSIS — Z01818 Encounter for other preprocedural examination: Secondary | ICD-10-CM | POA: Insufficient documentation

## 2010-08-13 NOTE — Miscellaneous (Signed)
Summary: BMP,LIPIDS,LIVER,HGBA1C  Clinical Lists Changes  Observations: Added new observation of CALCIUM: 9.2 mg/dL (07/18/2010 17:24) Added new observation of ALBUMIN: 3.9 g/dL (07/18/2010 17:24) Added new observation of PROTEIN, TOT: 6.3 g/dL (07/18/2010 17:24) Added new observation of SGPT (ALT): 17 units/L (07/18/2010 17:24) Added new observation of SGOT (AST): 18 units/L (07/18/2010 17:24) Added new observation of ALK PHOS: 88 units/L (07/18/2010 17:24) Added new observation of BILI DIRECT: 0.1 mg/dL (07/18/2010 17:24) Added new observation of CREATININE: 1.23 mg/dL (07/18/2010 17:24) Added new observation of BUN: 29 mg/dL (07/18/2010 17:24) Added new observation of BG RANDOM: 212 mg/dL (07/18/2010 17:24) Added new observation of CO2 PLSM/SER: 27 meq/L (07/18/2010 17:24) Added new observation of CL SERUM: 102 meq/L (07/18/2010 17:24) Added new observation of K SERUM: 4.6 meq/L (07/18/2010 17:24) Added new observation of NA: 139 meq/L (07/18/2010 17:24) Added new observation of LDL: 90 mg/dL (07/18/2010 17:24) Added new observation of HDL: 33 mg/dL (07/18/2010 17:24) Added new observation of TRIGLYC TOT: 173 mg/dL (07/18/2010 17:24) Added new observation of CHOLESTEROL: 158 mg/dL (07/18/2010 17:24) Added new observation of HGBA1C: 9.8 % (07/18/2010 17:24)    Hgb A1C is elevated.  Will need to have primary follow-up on this. Forward copy of labs to Primary  Jory Sims NP  Appended Document: BMP,LIPIDS,LIVER,HGBA1C PCP faxed these labs to our office.  He ordered labwork.

## 2010-08-14 LAB — CBC
HCT: 34.7 % — ABNORMAL LOW (ref 39.0–52.0)
Hemoglobin: 11.9 g/dL — ABNORMAL LOW (ref 13.0–17.0)
MCH: 30.9 pg (ref 26.0–34.0)
MCHC: 34.2 g/dL (ref 30.0–36.0)
MCV: 90.3 fL (ref 78.0–100.0)
Platelets: 191 10*3/uL (ref 150–400)
RBC: 3.84 MIL/uL — ABNORMAL LOW (ref 4.22–5.81)
RDW: 12.4 % (ref 11.5–15.5)
WBC: 12.2 10*3/uL — ABNORMAL HIGH (ref 4.0–10.5)

## 2010-08-14 LAB — BASIC METABOLIC PANEL
BUN: 26 mg/dL — ABNORMAL HIGH (ref 6–23)
CO2: 28 mEq/L (ref 19–32)
Calcium: 8.5 mg/dL (ref 8.4–10.5)
Chloride: 104 mEq/L (ref 96–112)
Creatinine, Ser: 1.64 mg/dL — ABNORMAL HIGH (ref 0.4–1.5)
GFR calc Af Amer: 54 mL/min — ABNORMAL LOW (ref >60)
GFR calc non Af Amer: 45 mL/min — ABNORMAL LOW (ref >60)
Glucose, Bld: 161 mg/dL — ABNORMAL HIGH (ref 70–99)
Potassium: 3.9 mEq/L (ref 3.5–5.1)
Sodium: 138 mEq/L (ref 135–145)

## 2010-08-14 LAB — DIFFERENTIAL
Basophils Absolute: 0.1 10*3/uL (ref 0.0–0.1)
Basophils Relative: 1 % (ref 0–1)
Eosinophils Absolute: 0.3 10*3/uL (ref 0.0–0.7)
Eosinophils Relative: 2 % (ref 0–5)
Lymphocytes Relative: 20 % (ref 12–46)
Lymphs Abs: 2.4 10*3/uL (ref 0.7–4.0)
Monocytes Absolute: 1.6 10*3/uL — ABNORMAL HIGH (ref 0.1–1.0)
Monocytes Relative: 13 % — ABNORMAL HIGH (ref 3–12)
Neutro Abs: 7.9 10*3/uL — ABNORMAL HIGH (ref 1.7–7.7)
Neutrophils Relative %: 65 % (ref 43–77)

## 2010-08-19 ENCOUNTER — Other Ambulatory Visit (HOSPITAL_COMMUNITY): Payer: Self-pay | Admitting: Family Medicine

## 2010-08-19 DIAGNOSIS — I82409 Acute embolism and thrombosis of unspecified deep veins of unspecified lower extremity: Secondary | ICD-10-CM

## 2010-08-22 ENCOUNTER — Ambulatory Visit (HOSPITAL_COMMUNITY)
Admission: RE | Admit: 2010-08-22 | Discharge: 2010-08-22 | Disposition: A | Discharge status: Home / Self Care | Payer: PRIVATE HEALTH INSURANCE | Source: Ambulatory Visit | Attending: Family Medicine | Admitting: Family Medicine

## 2010-08-22 DIAGNOSIS — M7989 Other specified soft tissue disorders: Secondary | ICD-10-CM | POA: Insufficient documentation

## 2010-08-22 DIAGNOSIS — I82409 Acute embolism and thrombosis of unspecified deep veins of unspecified lower extremity: Secondary | ICD-10-CM

## 2010-08-22 DIAGNOSIS — M79609 Pain in unspecified limb: Secondary | ICD-10-CM | POA: Insufficient documentation

## 2010-10-15 NOTE — Letter (Signed)
Oct 10, 2008    [name], [place]  [place], [place]   RE:  [name], [name]  MRN:  [id number]  /  DOB:  1961/03/18   Dear [name]:   It was a pleasure seeing [name] again.  As you know, he  discontinued close medical follow up approximately 18 months ago to  attend to the health of his mother, who unfortunately recently died.  He  has not kept up with his diet resulting in a substantial weight gain,  but now is motivated to resume a good medical regime and dietary regime.  He developed some edema in his right leg, which he attributed to  cellulitis.  This has improved after treatment with antibiotics.  He has  not had much in the way of dyspnea nor has he had any chest discomfort.  His diabetic control has been good.  Recent laboratory studies were  drawn in your office and have been requested.  He continues his positive  pressure device for sleep apnea.   MEDICATIONS:  Current medications are somewhat uncertain.  He reports  taking,  1. Aspirin 81 mg daily.  2. Lantus insulin per the correction dosage based upon his CBG.  3. Simvastatin 40 mg daily.  4. Ramipril 10 mg daily.  5. Torsemide 20 mg daily.  6. Metanx daily.  He believes that he is taking carvedilol, but is      unaware of the dose.   PHYSICAL EXAMINATION:  GENERAL:  Overweight, pleasant gentleman, in no  acute distress.  VITAL SIGNS:  The weight is 425, 50 pounds more than in September 2008.  Blood pressure 135/75, heart rate 60 and regular, respirations 14 and  unlabored.  HEENT:  Anicteric sclerae; normal oral mucosa.  NECK:  No jugular venous distention; no carotid bruits.  LUNGS:  Clear with decreased breath sounds at the bases.  CARDIAC:  Distant first and second heart sounds are normal.  ABDOMEN:  Soft and nontender; no organomegaly.  EXTREMITIES:  Trace edema on the left; 1+ edema on the right; some skin  discoloration above the ankle.  Distal pulses  intact.   IMPRESSION:  [name] has done well from a cardiac standpoint during  a period of suboptimal medical compliance.  He has no symptoms to  suggest recurrent myocardial ischemia.  He has had no active congestive  heart failure.  Blood pressure control appears adequate.  We will assess  control of hyperlipidemia.  If he is taking a good dose of carvedilol, a  repeat echocardiogram will be obtained to determine whether or not  implantation of a defibrillator is warranted.  Otherwise, I will  optimize medical therapy and plan to reassess this nice gentleman in 1  month.    Sincerely,      [name]. Franklin Haw, MD, [facility]  Electronically Signed    RMR/MedQ  DD: 10/10/2008  DT: 10/11/2008  Job #: [id number]

## 2010-10-15 NOTE — Letter (Signed)
February 16, 2007    [name]. [name], Franklin N. [address], [address]  [place], [name]   RE:  [name], [name]  MRN:  [id number]  /  DOB:  Dec 06, 1960   Dear [name]:   [name] returns to the office for continued assessment and  treatment of coronary disease. He has not had chest pain in recent  weeks. He does note epigastric discomfort that he attributes to his  esophageal problems. He has no significant dyspnea. He is planning to  start an exercise program.   His medications are unchanged from his last visit.   A stress nuclear study was performed. There was a large area of scar in  the distribution of the left anterior descending coronary artery  consistent with his known previous myocardial infarction. LV function  was moderately impaired. There was no ischemia.   On examination, overweight, pleasant gentleman in no acute distress. The  weight is 376, eleven pounds more than last month. Blood pressure  120/80, heart rate 66 and regular. Respirations 18.  NECK: No jugular venous distention.  LUNGS:  Clear.  CARDIAC: Normal 1st and 2nd heart sounds.  ABDOMEN: Soft and nontender.  EXTREMITIES: Trace edema.   IMPRESSION:  [name] is doing generally well. LV function is  borderline to justify placement of an AICD. The likelihood of increased  complications related to his obesity would mitigate against such a  device. Will add carvedilol and titrate the dosage in light of his  significant ischemic cardiomyopathy. He will be reevaluated by the  cardiology nurses in one month and by me in six months.    Sincerely,      [name]. Franklin Haw, MD, [facility]  Electronically Signed    RMR/MedQ  DD: 02/16/2007  DT: 02/16/2007  Job #: [id number]

## 2010-10-15 NOTE — Letter (Signed)
January 18, 2007    [name]. Franklin Addison, MD  [facility]  [name]. [address]  [place], [place] [name]   RE:  [name], [name]  MRN:  [id number]  /  DOB:  Feb 08, 1961   Dear [name]:   [name] returns to the office at your request after a recent  episode of chest discomfort.  I was supposed to have seen him in April,  but he was busy with various procedures related to removal of his  infected lap band.  Eventually, this was accomplished successfully.  He  continues to have some GI problems including apparent esophageal  stricture and perhaps dysmotility.  He was seen in your office recently  with a 2-day episode of back and left shoulder discomfort with some  radiation around to the left chest.  He also has chronic lower  chest/epigastric discomfort that appears to be related to his GI issues.  He had nitroglycerin, but did not take it.  He continues to have some  back and shoulder discomfort, but symptoms have improved.   Apparently, an EKG in your office was negative, as were cardiac markers.   CURRENT MEDICATIONS:  1. Aspirin 81 mg daily.  2. Lantus insulin per your direction.  3. Simvastatin 40 mg daily.  4. Ramipril 10 mg daily.  5. Torsemide 20 mg daily.   PHYSICAL EXAMINATION:  GENERAL:  A pleasant gentleman in no acute  distress.  VITAL SIGNS:  The weight is 365, 30 pounds less than in January.  Blood  pressure 110/70, heart rate 70 and regular, respirations 18.  NECK:  No jugular venous distention.  Normal carotid upstrokes without  bruits.  LUNGS:  Decreased breath sounds.  Otherwise clear.  CARDIAC:  Distant first and second heart sounds.  ABDOMEN:  Soft and nontender.  No organomegaly.  No bruits.  EXTREMITIES:  Trace edema.  Normal distal pulses.   IMPRESSION:  [name] is doing generally well.  His chest discomfort  is of some concern, but certainly not typical for myocardial ischemia.  A stress test is in order and should be  achievable, since his weight is  now well within the limits of our treadmill and nuclear medicine camera.  I will reassess this nice gentleman after that study has been performed.  A lipid profile and metabolic profile are pending.   Thanks so much for sending him back to see me.    Sincerely,      [name]. Franklin Haw, MD, [facility]  Electronically Signed    RMR/MedQ  DD: 01/18/2007  DT: 01/18/2007  Job #: [id number]   CC:    [name]. [name], M.D.

## 2010-10-15 NOTE — Procedures (Signed)
[name], [name]             ACCOUNT NO.:  [id number]   MEDICAL RECORD NO.:  [id number]          PATIENT TYPE:  OUT   LOCATION:  RAD                           FACILITY:  [facility]   PHYSICIAN:  Fay Records, MD, FACCDATE OF BIRTH:  02-15-1961   DATE OF PROCEDURE:  DATE OF DISCHARGE:  02/05/2007                                ECHOCARDIOGRAM   IDENTIFICATION:  The patient is a 50 year old with chest pain,  hypertension, test to evaluate.   A 2-D echo with echo Doppler.  Left ventricle is mildly dilated at 59  mm.  The interventricular septum is mildly thickened at about 14 mm.  Posterior wall appears mildly thickened.   The left atrium is mildly thickened at 44 mm.  Right atrium and right  ventricle are grossly normal.   The aortic valve is mildly sclerotic.  There is no insufficiency, no  stenosis.  Mitral valve is mildly thickened with trace insufficiency.  Pulmonic valve is grossly normal.  Tricuspid valve is normal with mild  insufficiency.   Overall LV systolic function is depressed at approximately 35-40%, with  akinesis of the mid distal septum, distal inferior, distal posterior and  apical walls.  RVF is grossly normal to mildly impaired.   No pericardial effusion is seen.   In comparison with report from 2004, there does not appear to be  significant change.      Fay Records, MD, [facility]  Electronically Signed     PVR/MEDQ  D:  02/08/2007  T:  02/09/2007  Job:  [id number]

## 2010-10-18 NOTE — Op Note (Signed)
[facility]  Patient:    [name], [name] Visit Number: [id number] MRN: [id number]          Service Type: MED Location: 2A A202 01 Attending Physician:  Delorise Jackson Dictated by:   Irving Shows, M.D. Admit Date:  04/02/2001   CC:         [name], M.D.   Operative Report  PREOPERATIVE DIAGNOSES:  Right scrotal abscess and large chronic left scrotal abscess.  POSTOPERATIVE DIAGNOSES:  PROCEDURE:  Excision and closure of right scrotal abscess and excision and closure of left scrotal abscess with drainage of left perirectal abscess.  SURGEON:  Irving Shows, M.D.  ANESTHESIA:  Spinal anesthesia.  DESCRIPTION OF PROCEDURE:  Under spinal anesthesia, the genitalia and perineum were prepped and draped in a sterile field.  There was a large thickened draining abscess of the right hemiscrotum.  Wide excision of this abscess cavity was carried out, taking care to stay away from the abscess cavity so that the cavity was not entered during the dissection.  Incision was extended down through good subcutaneous tissue.  Once it was excised, hemostasis was achieved.  Deep tissues were closed with running 2-0 chromic and skin and subcutaneous tissue were closed with running 3-0 chromic.  Patient tolerated this procedure well.  After completion of the right side, the left side was approached.  He had a sinus tract that started at about medium-scrotal level and extended into the groin and into the perirectal space.  Wide elliptical incision was carried out, with excision of an area that measured about 6 x 10 cm.  Incision was extended down to good viable tissue.  There were multiple draining tracts which were encountered in the deep tissue.  All of these tracts were explored with Kelly clamps and fully excised.  Hemostasis was achieved.  There was a large tract inferiorly that extended into the groin and into the left perirectal space.  It extended almost  to the skin in the perirectal space. The question as to whether or not this represented a perirectal abscess which extended upward could not be determined but it appeared to be so.  Once wide excision of this area was carried out, the tract itself was curetted throughout its depth.  A one-fourth-inch Penrose drain was placed and sutured intact.  The other large defect was then closed using running and interrupted 2-0 chromic for the deep tissues and 3-0 chromic for the subcutaneous tissue and skin.  ABD pads were used as a dressing along each hemiscrotum.  The drain was secured with 3-0 nylon.  Patient tolerated procedure well.  He was transferred to a bed and taken to the postanesthesia care unit for further monitoring. Dictated by:   Irving Shows, M.D. Attending Physician:  Delorise Jackson DD:  04/02/01 TD:  04/04/01 Job: [id number]

## 2010-10-18 NOTE — Cardiovascular Report (Signed)
[place]  Patient:    [name], [name]                    MRN: [id number] Proc. Date: 11/06/00 Adm. Date:  [id number] Attending:  Garth Bigness CC:         [name], M.D.  [name] C. Wall, M.D. [facility]  Cardiac Catheterization Laboratory   Cardiac Catheterization  PROCEDURES PERFORMED: 1. Left heart catheterization with coronary angiography and left    ventriculography. 2. Percutaneous transluminal coronary angioplasty with stent placement in the    proximal left anterior descending artery. 3. Percutaneous transluminal coronary angioplasty of the second diagonal    branch.  INDICATIONS:  The patient is a 50 year old male, admitted to [facility] with a scrotal and perirectal abscess.  While in the hospital he experienced chest pain and subsequently ruled in for myocardial infarction. He had evolutionary ECG changes consistent with an anteroapical wall myocardial infarction.  He was transferred to [facility] and referred for cardiac catheterization.  Note, the patient is morbidly obese, weighing over 430 pounds.  DESCRIPTION OF PROCEDURE:  A 6 French sheath was placed in the right femoral artery.  Standard Judkins 6 French catheters were utilized.  Contrast was Omnipaque.  There were no complications.  RESULTS:  HEMODYNAMICS:  Left ventricular pressure 118/30.  Aortic pressure 110/80. There was no aortic valve gradient.  LEFT VENTRICULOGRAM:  There is severe akinesis of the anterior wall, severe akinesis to dyskinesis of the apical wall, and moderate akinesis of the inferoapical wall.  Ejection fraction is estimated at 35%.  There is no mitral regurgitation.  CORONARY ARTERIOGRAPHY:  (Codominant).  Left main:  Left main has a proximal 20% stenosis.  Left anterior descending:  The left anterior descending artery has 100% occlusion in the proximal vessel just before a large second diagonal branch. After this vessel was  opened by percutaneous intervention he was found to have a 95% stenosis at the origin of the second diagonal and thrombus and a diffuse 25% stenosis in the mid LAD.  The LAD was a very lengthy vessel curling the apex and supplying the apical portion of the inferior wall.  Left circumflex:  The left circumflex is a codominant vessel.  It gives rise to a large ramus intermediate which has a 20% stenosis.  The circumflex then gives rise to a small OM-1, normal sized OM-2 and a small posterolateral branch.  Right coronary artery:  The right coronary artery is a relatively small codominant vessel.  There is a 60% somewhat hazy stenosis in the mid vessel. The distal right coronary artery gives rise to a small posterior descending artery, a small posterolateral branch.  IMPRESSIONS: 1. Moderate to significantly decreased left ventricular systolic function    secondary to extensive anteroapical wall myocardial infarction. 2. Two-vessel coronary artery disease as described.  The culprit is the lesion    in the proximal left anterior descending.  There is moderate disease in the    mid right coronary artery which does not appear obstructed.  PLAN:  Percutaneous intervention of the LAD.  PERCUTANEOUS TRANSLUMINAL CORONARY ANGIOPLASTY PROCEDURE:  Following completion of the diagnostic catheterization, we proceeded with coronary intervention.  The patient had been started on Integrilin prior to coming to the catheterization laboratory and this was continued.  Heparin was administered to achieve an ACT of greater than 200 seconds.  We used a 7 [place] guiding catheter.  We initially tried to cross the  lesion with a Hi-Torque Floppy wire, however, this would not cross.  We then successfully crossed the lesion with a choice PT wire which was advanced into the distal LAD.  The LAD was then dilated with a 3.0 x 15 mm Maverick balloon inflated to 8 atmospheres for 3 inflations, then 10 atmospheres.   We attempted to advance a wire into the second diagonal branch, however, could not because of a significant residual stenosis in both the LAD and the diagonal.  We therefore deferred pain level a 3.0 x 18 mm AVE S7 stent in the LAD extending across the origin of the second diagonal branch.  This was deployed at a pressure of 16 atmospheres.  Following this, we were successfully able to pass a choice PT through the side struts of the stent into the second diagonal.  We dilated the origin of the second diagonal with a 2.5 x 15 mm Maverick balloon inflating it to 8 atmospheres.  We then post-dilated the stent in the LAD with a 3.5 x 15 mm Quantum balloon positioned at the proximal edge of the stent inflated to 16 atmospheres.  We performed another inflation of the diagonal branch with a 2.5 x 15 mm Maverick balloon inflated to 10 and then 12 atmospheres.  We finally did one more inflation of the stent in the LAD with a 3.5 x 15 mm Quantum balloon inflated to 14 atmospheres.  Final angiographic images reveal patency to the LAD and the diagonal with 0% residual stenosis and TIMI-3 flow in the LAD and 50% residual stenosis with TIMI-3 flow in the diagonal.  COMPLICATIONS:  None.  RESULTS:  Complex but successful percutaneous transluminal coronary angioplasty with stent placement in the proximal left anterior descending reducing a 100% occlusion with thrombus to 0% residual with TIMI-3 flow.  The second diagonal was reduced from 95% stenosis with thrombus to 50% with TIMI-3 flow.  PLAN:  The patient will be aggressively hydrated because of a large amount of contrast required for this procedure.  Integrilin will be continued for 24 hours.  Plavix will be administered for 4 weeks.  We would recommend considering immediate term Coumadin for his anteroapical akinesis on the ventriculography. DD:  11/06/00 TD:  11/06/00 Job: [id number]

## 2010-10-18 NOTE — Op Note (Signed)
[name], [name]             ACCOUNT NO.:  [id number]   MEDICAL RECORD NO.:  [id number]          PATIENT TYPE:  AMB   LOCATION:  DAY                           FACILITY:  [facility]   PHYSICIAN:  Miguel Dibble, M.D.   DATE OF BIRTH:  Oct 14, 1960   DATE OF PROCEDURE:  11/20/2004  DATE OF DISCHARGE:                                 OPERATIVE REPORT   PREOPERATIVE DIAGNOSIS:  Hematuria.   POSTOPERATIVE DIAGNOSIS:  Hematuria, bulbous urethral stricture.   OPERATIVE PROCEDURE:  Cystoscopy and urethral dilation.   ANESTHESIA:  General.   SURGEON:  Dr. Maryland Pink.   COMPLICATIONS:  None.   DRAINS:  None.   INDICATIONS FOR PROCEDURE:  This 50 year old male has been having  intermittent painless gross hematuria. X-rays and urine cytology were  negative. The patient was taken to the OR today for cystoscopy to evaluate  for bladder lesions.   DESCRIPTION OF PROCEDURE:  General anesthesia was induced, and the patient  was placed on the OR table in the dorsolithotomy position. The lower abdomen  and genitalia were prepped and draped in a sterile fashion. Cystoscopy was  done with a 25-French scope. The anterior urethra was normal. There was  moderate degree of bulbous urethral stricture, and the scope could not be  introduced through the stricture. The cystoscope was then removed.  Cystoscopy was done with a 17-French scope with direct vision. The scope  could be passed through the stricture. The prostate was normal. The trigone,  ureteral orifices and bladder mucosa were unremarkable. There was no  evidence of any bladder tumor, inflammation or foreign body in the bladder.  The cystoscope was then removed. The urethral stricture was then dilated to  22-French with the [employer] sounds. Cystoscopy was repeated and the  stricture was found to be adequately dilated. There was no evidence of any  injury to the urethra or bleeding. The instruments were removed. The patient  was transferred to  the PACU in a satisfactory condition.      SK/MEDQ  D:  11/20/2004  T:  11/20/2004  Job:  [id number]

## 2010-10-18 NOTE — Discharge Summary (Signed)
[name], [name]             ACCOUNT NO.:  [id number]   MEDICAL RECORD NO.:  [id number]          PATIENT TYPE:  INP   LOCATION:  A319                          FACILITY:  [facility]   PHYSICIAN:  Vernon Prey. [name], M.D.   DATE OF BIRTH:  03-05-1961   DATE OF ADMISSION:  07/27/2004  DATE OF DISCHARGE:  03/04/2006LH                                 DISCHARGE SUMMARY   DISCHARGE DIAGNOSES:  1.  Wound abscess secondary to port site for adjustable gastric lap band      placement.  2.  Morbid obesity.  3.  Diabetes mellitus.  4.  Hypertension.  5.  Coronary artery disease.  6.  Sleep apnea.  7.  Chronic venous insufficiency.  8.  Pre-renal azotemia.   DISPOSITION:  The patient is discharged home with home health followup.   DISCHARGE MEDICATIONS:  1.  Doxycycline 100 mg b.i.d.  2.  Cleocin 300 mg q.i.d.  3.  Coumadin 10 mg daily.  4.  Altace 2.5 mg daily.  5.  Aspirin 81 mg daily.  6.  Potassium chloride 20 mEq b.i.d.  7.  Metoprolol 50 mg b.i.d.  8.  Torsemide 20 mg two daily.  9.  Metformin 1000 mg b.i.d. with meals.  10. Insulin 70/30, 60 units in the a.m. and 40 units in the p.m.  11. Dilaudid 4 mg q.i.d.   FOLLOWUP:  The patient is scheduled to be seen in the office five days post-  discharge.   SUMMARY:  A 50 year old male who presents to the emergency room with a  draining abscess in his left upper quadrant.  The patient gives a history of  undergoing a laparoscopic adjustable gastric banding procedure on January  27.  This was done in [place], [place].  He was monitored overnight and then  discharged home.  He states that he has done very well.  He admits to an 80  pound weight loss.  He noted increasing swelling in the upper paramedian  incision on the left side of his abdomen.  The incision became more painful  and reddened, it started to drain on the day of admission.  He was seen in  the emergency room where he was noted to have an obvious abscess with a  tender  swollen area in the left upper quadrant.  The patient was admitted.   PAST MEDICAL HISTORY:  1.  Severe atherosclerotic heart disease, status post myocardial infarction.  2.  Insulin-dependent diabetes mellitus.  3.  Sleep apnea.  4.  Chronic venous insufficiency.   Specifics of his heart disease is given in the admission note.   PHYSICAL EXAMINATION:  GENERAL:  He was morbidly obese, in no acute  distress.  VITAL SIGNS:  Blood pressure was 100/70, pulse 90, respirations 20,  temperature 98.4, weight was said to be 446 pounds.  NECK:  Unremarkable.  CHEST:  Clear.  HEART:  Regular.  ABDOMEN:  Revealed five laparoscopic scars in the left upper quadrant with  one in the right upper quadrant.  There was a central incision in the center  portion of the five points close  to the midline which was very erythematous  and indurated with moderate tenderness and was draining a bloody purulent  substance.   HOSPITAL COURSE:  The left upper abdomen was prepped and draped at bedside.  It was locally infiltrated with Xylocaine and a deep abscess cavity was  opened with exudation of a large volume of purulent material.  This extended  down to the fascia.  The wound was flushed with saline _______________.  Wound cultures were taken.  He was started on Zosyn and vancomycin.  It was  felt that the patient had potential major difficulty because we did not know  the source of the infection, nor were we familiar with the operating  facility that was down in [place].  Nonetheless, the patient did well.  He  stayed afebrile.  The wound cleaned up nicely.  The induration and  cellulitis improved.  His BUN was 31, and creatinine was 2, at the time of  admission.  Initial wound cultures showed no growth.  The patient was  continued on local wound care and IV antibiotics, and by August 01, 2004, he  was doing well.  The wound base was clean.  Repeat Gram stain of the  drainage showed no bacteria.  He was  discharged home on August 03, 2004, much  improved with plans for local wound care as noted.      LCS/MEDQ  D:  09/21/2004  T:  09/22/2004  Job:  [id number]

## 2010-10-18 NOTE — Discharge Summary (Signed)
[facility]  Patient:    [name], [name] Visit Number: [id number] MRN: [id number]          Service Type: MED Location: 2A A202 01 Attending Physician:  Delorise Jackson Dictated by:   Irving Shows, M.D. Admit Date:  04/02/2001 Discharge Date: 04/05/2001                             Discharge Summary  DISCHARGE DIAGNOSES: 1. Bilateral scrotal abscesses. 2. Insulin-dependent diabetes mellitus. 3. Atherosclerotic heart disease, multivessel, status post angioplasty and    stenting. 4. Morbid obesity.  PROCEDURE:  Wide excision and primary closure of bilateral scrotal abscesses with drainage of perirectal abscess on November 1.  DISPOSITION:  Discharged to home.  CONDITION ON DISCHARGE:  Stable and satisfactory condition.  DISCHARGE MEDICATIONS: 1. Tylox two every four hours as needed for pain. 2. Cleocin 300 mg every six hours. 3. Lopressor 50 mg twice daily. 4. Zocor 20 mg at bedtime. 5. Altace 2.5 mg daily. 6. Humulin N 50 units q.a.m. 7. Celebrex 200 mg two q.d.  FOLLOWUP:  The patient is scheduled to be followed up in the office on November 11.  HISTORY OF PRESENT ILLNESS:  This is a 50 year old male with a history of recurrent left scrotal abscess with episodes of bacteremia.  He has severe insulin-dependent diabetes mellitus.  He was scheduled to have the area excised and he suffered an acute myocardial infarction and was hospitalized. He has recovered from the MI and is doing well.  He has had three recurrences of the abscess since June.  He is admitted to have the area fully excised.  PAST MEDICAL HISTORY: 1. Coronary artery disease, status post acute myocardial infarction with    cardiac catheterization showing a total occluded LAD which was successfully    angioplastied and stented.  He had a 95% stenosis, but the origin of the    second diagonal was 60% stenosis of the mid vessel to the right coronary.    Ejection fraction 35%  with evidence of akinesis of anterior wall,    dyskinesis of the apex and inferior wall.  The 95% lesion of the second    diagonal stented to 50%. 2. The patient was undergoing cardiac rehabilitation, but he had difficulty    because his of his scrotal pain and knee pain.  He was also morbidly obese.  PHYSICAL EXAMINATION:  He is a morbidly obese male in no acute distress.   He had no problems except for chronic venous changes of his lower extremities with 1+ edema and a right scrotal abscess with a large chronic left scrotal abscess.  There did not appear to be any involvement to the testicle.  HOSPITAL COURSE:  He was admitted and under spinal anesthesia, wide excision and primary closure of the large abscesses were carried out.  There was perirectal extension to the one on the left.  A Penrose drain was placed and stent and seton secured.  The patient was admitted to have IV antibiotics.  He did well in the perioperative period.  Pain was controlled with oral analgesics.  He remained afebrile and had minimal drainage.  He was felt to be stable for discharge.  He was discharged in satisfactory condition. Dictated by:   Irving Shows, M.D. Attending Physician:  Delorise Jackson DD:  05/23/01 TD:  05/25/01 Job: [id number]

## 2010-10-18 NOTE — Consult Note (Signed)
[name], ICE             ACCOUNT NO.:  [id number]   MEDICAL RECORD NO.:  [id number]          PATIENT TYPE:  INP   LOCATION:  A319                          FACILITY:  [facility]   PHYSICIAN:  Barrie Folk. Franklin Addison, MD     DATE OF BIRTH:  10-19-1960   DATE OF CONSULTATION:  DATE OF DISCHARGE:                                   CONSULTATION   CONSULTING PHYSICIAN:  Barrie Folk. [name], M.D.   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   CHIEF COMPLAINT:  Pustular drainage.   HISTORY OF PRESENT ILLNESS:  History is positive for pustular drainage, less  than 24 hours.  The patient has experienced significant drainage from wound  site of laparoscopic procedure for gastro reduction, on June 28, 2004, in  [place], [place] by Dr. Holley Waller.  There were no complications  according to the patient.  He has experienced swelling around the  laparoscopic site since surgery.  The patient was told to put warm, moist  compresses over tender, swollen, red area by Dr. Laurin Waller.  The patient has  experienced some chills and sweating.  He denies fever.   MEDICAL HISTORY:  1.  Positive for morbid obesity.  2.  Insulin-dependent-diabetes mellitus.  3.  Coronary artery disease.  4.  Sleep apnea.  5.  Venous insufficiency.  Medical history is negative for hypertension, tuberculosis, cancer, sickle  cell, asthma, seizure disorder.   PAST HOSPITALIZATIONS:  1.  Rupture of left scrotal abscess in October 1998.  2.  Left periurethral scrotal abscess treated with excision and drainage in      March 1997, by Dr. Fredrich Birks.  3.  New onset diabetes mellitus in October 1994.  4.  Excision of carbuncle in 1983 at [facility].  5.  Back sprain in 1987, at [facility] in [place], [place].  6.  Right kidney swelling with cellulitis in January 1992.  7.  Uncontrolled diabetes mellitus and acute cellulitis of  left leg with      venous stasis dermatitis and multiple ulcers in August 2004.   PRESCRIBED MEDICINES ON ADMISSION:  1.  Metformin 1,000 mg p.o. b.i.d. with meals.  2.  Torsemide 20 mg two tablets p.o. every day.  3.  Metoprolol 50 mg p.o. b.i.d.  4.  Potassium chloride 20 mEq two tablets p.o. every morning.  5.  Coumadin 10 mg p.o. every day.  6.  Altace 2.5 mg p.o. every day.  7.  Aspirin 81 mg one p.o. every day.   The patient is not allergic to any known medication.   HABITS:  Positive for former cigarette smoking x 14 years and positive for  social use of ethanol.  Habits are negative for use of street drugs.   FAMILY HISTORY:  Revealed mother living at age 38 with a history of coronary  artery disease.  Father deceased secondary to heart attack.  One brother  deceased at age 71 secondary to idiopathic cardiomyopathy.  One brother  living age 58, history of diabetes mellitus.   REVIEW  OF SYSTEMS:  Positive compromised exercise tolerance.  Decreased oral  intake since surgery.  Chronic discoloration of the distal legs.  Swelling  of both legs.  Review of systems is negative for epistaxis, chest pain,  shortness of breath at rest, syncope, dizziness, dysuria, gross hematuria,  melena, hematochezia, and diarrhea.  The patient also experienced sleep  apnea and snoring.   PHYSICAL EXAMINATION:  VITAL SIGNS:  Temperature is 98.4, blood pressure  86/55, pulse 95, respirations 20.  O2 sat 96% on room air.  Repeat blood  pressure 117/65 at 1438 with a pulse of 81.  GENERAL APPEARANCE:  Revealed a middle-aged, overweight, large framed,  slightly tall, black male in no apparent respiratory distress.  HEAD:  Normocephalic.  EARS:  Normal auricles.  External canals patent.  Tympanic membranes pearly  gray.  EYES:  Lids negative for ptosis.  Sclerae are white.  Pupils round, equal,  and reactive to light.  Extraocular movements intact.  NOSE:  Negative for discharge.  MOUTH:   Dentition good.  No bleeding gums.  No oral lesions.  Posterior  pharynx benign.  NECK:  Negative for lymphadenopathy or thyromegaly.  LUNGS:  Clear.  HEART:  Audible S1 S2 without murmur.  Regular rate and rhythm.  ABDOMEN:  Obese.  Positive for laparoscopic surgical scar with draining  pustular stoma from one of the scars.  Positive for induration around scar  the diameter of a baseball.  Positive for tenderness on palpation of  induration.  No organomegaly.  GENITALIA:  Penis uncircumcised.  No penile lesion or discharge.  Scrotum  palpable testicles without nausea or tenderness.  RECTAL:  Deferred.  EXTREMITIES:  Right knee positive for abrasive scar.  Positive for confluent  hyperpigmentation of right distal tibia.  Left tibia rubra discoloration.  Trace tibial edema bilaterally.  Feet palpable dorsalis pedis bilaterally.  Web spaces between toes of each foot positive for maceration.  NEUROLOGIC:  Alert and oriented to person, place and time.  Cranial nerves  II-XII appeared intact.   LABS:  White count 10.7, hemoglobin 14.4, hematocrit 42.2, platelets  317,000.  Prothrombin time 15.1, INR 1.3.  Sodium 140, potassium 4.5,  chloride 101, CO2 34, sugar 145, BUN 30, creatinine 1.7.  SGOT 22, SGPT 19,  total protein 6.2, albumin 2.9.   IMPRESSION:  Primary postoperative abdominal wound abscess.   SECONDARY DIAGNOSES:  1.  Coronary artery disease, post myocardial infarction.  2.  Morbid obesity.  3.  Insulin-dependent-diabetes mellitus.  4.  Sleep apnea.  5.  Status post gastric reduction surgery.  6.  Hyperlipidemia.  7.  Chronic venous insufficiency with stasis dermatitis.   PLAN:  1.  IV antibiotics.  2.  Gram stain and culture of surgical wound drainage.  3.  Dietician consult because of limited oral intake.  4.  Respiratory consult because of sleep apnea.  5.  Accu-Chek a.c. and bedtime.  6.  Urinalysis, fasting lipid profile.  7.  Zocor 40 mg p.o. every bedtime. 8.   Novolin 70/30 or a sliding scale.      GKH/MEDQ  D:  07/27/2004  T:  07/27/2004  Job:  [id number]

## 2010-10-18 NOTE — H&P (Signed)
[name], [name]             ACCOUNT NO.:  [id number]   MEDICAL RECORD NO.:  [id number]          PATIENT TYPE:  AMB   LOCATION:  DAY                           FACILITY:  [facility]   PHYSICIAN:  Miguel Dibble, M.D.   DATE OF BIRTH:  [birth year]   DATE OF ADMISSION:  11/20/2004  DATE OF DISCHARGE:  LH                                HISTORY & PHYSICAL   CHIEF COMPLAINT:  Intermittent mild hematuria.   HISTORY OF PRESENT ILLNESS:  This 50 year old male was referred to me by Dr.  [name] for evaluation of intermittent mild pain with hematuria.  The patient  has undergone gastric band surgery for obesity in [place].  He has a wound  infection surrounding the vascular access port on the left side of the  chest.  He has been treated with antibiotics by Dr. [name].  He noticed  intermittent mild gross hematuria.  He has passed small blood clots in the  urine.  There is no history of voiding difficulty, dysuria, flank pain,  fever, or chills.  He has urinary frequency times two to three and nocturia  times zero.   PAST MEDICAL HISTORY:  1.  Morbid obesity.  2.  Insulin-dependent-diabetes mellitus.  3.  Status post drainage of multiple scrotal abscesses and perineal      abscesses in the past.  4.  Status post gastric banding surgery for obesity.   MEDICATIONS:  Insulin on a sliding scale, Metformin, Altace, hydromorphone,  Levaquin, and Coumadin which has been stopped for the surgery.   ALLERGIES:  None.   PHYSICAL EXAMINATION:  VITAL SIGNS:  Height 6 foot 4 inches, weight 380  pounds.  HEENT:  Normal.  LUNGS:  Clear to auscultation.  HEART:  Regular rate and rhythm.  No murmurs.  ABDOMEN:  Soft.  No palpable flank mass.  No costovertebral angle  tenderness.  Bladder is not palpable.  GENITOURINARY:  Penis and testes are normal.  RECTAL:  35 g size benign prostate.   Renal function is normal.  BUN 9, creatinine 1.2, PSA normal 0.3 ng/mL.  Wound cytology revealed no evidence of  malignant cells.  CT scan of the  abdomen and pelvis without any contrast revealed no renal abnormalities,  renal mass, calculus, or obstruction.   IMPRESSION:  Hematuria.   PLAN:  1.  The patient is brought to the [facility] today for a cystoscopy      to rule out bladder lesions.  2.  He is scheduled to undergo cystoscopy, possible bladder biopsy, possible      transurethral resection of bladder      tumor under anesthesia.  3.  I have explained to the patient and his family regarding the diagnosis,      operative details, alternate treatments, outcome, possible risks and      complications, and he has agreed for the procedure to be done.       SK/MEDQ  D:  11/20/2004  T:  11/20/2004  Job:  [id number]   cc:   [name]  Fax: [phone]

## 2010-10-18 NOTE — H&P (Signed)
NAMEBUCKY, [name]                       ACCOUNT NO.:  [id number]   MEDICAL RECORD NO.:  [id number]                   PATIENT TYPE:  INP   LOCATION:  A228                                 FACILITY:  [facility]   PHYSICIAN:  [name], M.D.                DATE OF BIRTH:  04-12-1961   DATE OF ADMISSION:  01/27/2003  DATE OF DISCHARGE:                                HISTORY & PHYSICAL   The patient is a 50 year old, separated, self employed, black male from  [place], [place].   CHIEF COMPLAINT:  General malaise, increased thirst, increased frequency of  urination, dry mouth, left leg pain and redness of left leg.  Left leg has  been hurting x24 hours. History is positive for increased frequency of  urination in the past 24 hours and chronic left ulcers of the lower  extremity. Ulcers are being managed by vascular surgeon, Dr. Zada Girt,  since July of 2003.  Past history is positive for hypertension, type 2  diabetes mellitus on insulin, morbid obesity, coronary artery disease, and  chronic venous insufficiency. Medical history is negative for tuberculosis,  cancer, sickle cell, asthma, seizure disorder. The patient denies fever,  chills, dizziness, nausea, and vomiting.   MEDICATIONS ON ADMISSION:  1. Demadex 40 mg p.o. daily.  2. Potassium 40 mEq p.o. daily.  3. Altace 2.5 mg p.o. daily.  4. Lopressor 50 mg p.o. b.i.d.  5. Zocor 40 mg p.o. at bedtime.  6. One aspirin 81 mg p.o. daily.  7. Tylox 2 tablets p.o. every 4-6h. p.r.n. for pain.  8. Fortamet 500 mg one tablet after breakfast and supper.  9. Allegra 180 mg p.o. daily.  10.      Novolin 70/30 on a sliding scale.   ALLERGIES:  The patient is not allergic to any known medications.   HABITS:  Negative for street drug use. Habit is positive for former  cigarette smoking x20 years. The patient does drink socially.   SEXUALLY TRANSMITTED DISEASE HISTORY:  Negative for gonorrhea, syphilis, and  HIV  infections. Sexually transmitted disease history is positive for type 2  herpes simplex.   PAST MEDICAL HISTORY:  1. Positive for hospitalization for rupture of left scrotal abscess in     October 1998.  2. Hospital for left periurethral scrotal abscess treated with incision and     drainage on August 20, 1995, by Dr. Maryland Pink.  3. New onset of diabetes mellitus in October 1994.  4. Excision of carbuncle in 1983 at [facility].  5. Back sprain in 1987 at [facility] in [place], [place].  6. Right kidney swelling with cellulitis in January of 1992.  7. Myocardial infarction in June of 2002.   FAMILY HISTORY:  Mother living age 32 with a history of coronary artery  disease; father deceased in his 71's secondary to complications of diabetes  mellitus; one brother living age 42  with a history of diabetes mellitus; one  brother deceased age 66 secondary to a heart attack.   REVIEW OF SYMPTOMS:  Positive for snoring, sleep apnea, chronic edema of  leg. shortness of breath on minor exertion, episodic coughing spells (dry).  Review of systems is negative for chronic headache, epistaxis, hemoptysis,  bleeding gum, chest pain, syncope, dizziness, hematemesis, dysuria, gross  hematuria, melena, diarrhea, constipation, unexplained fever, night sweats,  etc.   PHYSICAL EXAMINATION:  VITAL SIGNS:  Pulse 100, respirations 26, blood  pressure 150/80, weight greater than 350 pounds.  GENERAL:  A middle aged, overweight, tall, black male who appeared not to  feel well and short of breath on moderate exertion.  HEENT:  Head:  Normocephalic.  Ears:  Normal auricle.  External canal  patent, tympanic membrane pearly gray. Eyes:  Lid negative for ptosis.  Sclerae white.  Pupils round, equal, and reactive to light. Extraocular  movements intact. Nose:  Negative for discharge.  Mouth:  Dentition is good.  No bleeding gums. Posterior stridor benign.  NECK:  Negative for  lymphadenopathy or thyromegaly.  LUNGS:  Clear.  HEART:  Audible S1 and S2 without murmur. Rate 100 and regular rhythm.  ABDOMEN:  Obese, hyperactive bowel sounds, soft, nontender all four  quadrants, no palpable masses, no organomegaly.  GENITALIA:  Penis: Uncircumcised. No penile lesions or discharge. Scrotum:  Palpable testicles without nodule or tenderness.  RECTAL:  Deferred.  EXTREMITIES:  Left tibia +1 edema, right tibia +1 edema. Left distal tibia  positive for confluent hyperpigmentation. Left tibia proximal positive for  confluent redness and increased warmth. Left tibia positive for two  irregular grade 2 ulcers mid part of tibia. Right tibia heel ulcerated area,  feet palpable dorsalis pedis bilaterally.  NEUROLOGIC:  Alert and oriented to person, place and time. Cranial nerves II-  XII appeared intact.   Accu-Chek in the office 443. O2 sat 83% on room air.   IMPRESSION:  1. Primary uncontrolled diabetes mellitus.  2. Acute cellulitis of the left leg with venous stasis, dermatitis, and     multiple ulcers.   SECONDARY DIAGNOSES:  1. Coronary artery disease.  2. Morbid obesity.  3. Sleep apnea.  4. Hyperlipidemia.   ADDENDUM:  The patient had a cardiac catheterization in June of 2002 that  demonstrated left main coronary artery with an approximate 20% stenosis;  left anterior distended coronary artery had a 100% occlusion proximally  before an enlarged second diagonal; after opening off the lesion by a  balloon angioplasty and stent, (the occlusion was reduced to a zero residual  with a TIMI-3 flow); the patient was noted to have a 95% stenosis at the  origin of the second diagonal, with a thrombus and diffuse 25% stenosis in  the mid LAD; the LAD was also noted to be a very lengthy vessel curling the  apex and supplying the apical portion of the inferior wall; the lesion was  reduced with a balloon from 95% to 50%, with TIMI-3 flow; the left circumflex coronary  artery was codominant; the ramus intermediate was lodged  with a 20% stenosis; the circumflex then gave rise to a small OM-I, a normal  size OM-II, and a small PL branch; the right coronary vessel was a small  codominant vessel; a 60% rather hazy stenosis in the mid vessel; the distal  right coronary artery gave rise to a small PDA and a small PL branch; severe  akinesis of the anterior wall, severe akinesis/dyskinesis off the  apical  wall, moderate akinesis off the inferior apical wall; ejection fraction  estimated to be 35%; there was no mural regurgitation.   PLAN:  Telemetry x24 hours, O2 sat on room air every 6h. x24 hours, ABG on  room air.   MEDICATIONS:  1. Demadex 40 p.o. daily.  2. Potassium 40 mEq p.o. daily.  3. Altace 2.5 mg p.o. daily.  4. Lopressor 50 mg p.o. b.i.d.  5. Zocor 40 mg p.o. at bedtime.  6. One aspirin 81 mg p.o. daily.  7. Tylox 2 tablets p.o. every 4-6h. p.r.n. for pain.  8. Fortamet 500 mg one tablet after breakfast and supper.  9. Allegra 180 mg p.o. daily.  10.      Levaquin 750 mg p.o. daily.  11.      Humulin on a sliding scale blood sugar 150-200, 5 units subcu; 201-     225, 8 units subcu; 226-275, 10 units subcu; 276-300, 12 units subcu; 301-     350, 15 units subcu; 351-400, 18 units subcu; greater than 400, 20 units     subcu.   VASCULAR SURGEON CONSULT:  Harold A. Nils Pyle, M.D., for care of wound.   Accu-Chek 0700, 1700 and 2200 daily. Robitussin-DM 2 teaspoons p.o. daily,  Colace 200 mg p.o. at bedtime. Chest x-ray, EKG.  Laboratory data CBC, MET  7, liver function tests, urinalysis, fasting lipid profile, hemoglobin A1c.   DIET:  2200 calorie, 4 gram sodium, low cholesterol.                                               [name], M.D.    Armanda Heritage  D:  01/27/2003  T:  01/27/2003  Job:  [id number]

## 2010-10-18 NOTE — Letter (Signed)
March 03, 2006     [name], [place] [address] [place], [place] [address]   RE:  ABIGAIL, LITFIN  MRN:  [id number]  /  DOB:  [birth year]   Dear.  [name]:   It was my pleasure assessing Mr. Homes in consultation at your request.  As you know, this nice gentleman was previously followed by Dr. Verl Blalock  subsequent to percutaneous intervention and stenting of an LAD lesion plus  balloon angioplasty of a second diagonal stenosis.  He has done well from a  cardiac standpoint with no subsequent cardiopulmonary symptoms.  He has had  quite an odyssey for treatment of obesity, undergoing laparoscopic gastric  banding in [place] and subsequently developing wound infection that has been  substantially improved over the past year and a half, but not completely  resolved.  Dr. Verl Blalock was treating him for dyslipidemia.  He has diabetes that  has improved with recent 100+ pound weight loss.  There was also a history  of hypertension, sleep apnea and venous insufficiency.   Past medical, social and family history were updated and are as noted above.   CURRENT MEDICATIONS:  1. Ramipril 2.5 mg daily.  2. Torsemide 40 mg daily.  3. Aspirin 81 mg daily.  4. Lantus insulin 25/40 units per day based upon blood glucose.  5. Multivitamin.  6. Warfarin.  The patient is not aware of why he was taking Warfarin nor      has monitoring been performed in some time.  Fortunately, INR in your      office was normal.   PHYSICAL EXAMINATION:  GENERAL APPEARANCE:  A pleasant, overweight gentleman  in no acute distress.  VITAL SIGNS:  Weight 391, 65 pounds less than in August 2004, but the  patient reports that his highest weight was considerably above our value of  455 at that time.  Blood pressure 120/80, heart rate 75 and regular,  respirations 16.  HEENT:  Slightly increased light reflex from the arteries on funduscopic  examination; no AV nicking.  Normal oral mucosa.  NECK:  No jugular  venous distension; normal carotid upstrokes without  bruits.  ENDOCRINE:  No thyromegaly.  LUNGS:  Clear.  CARDIOVASCULAR:  Distant first and second heart sounds, modest systolic  murmur.  ABDOMEN:  Soft and nontender; normal bowel sounds; no masses.  EXTREMITIES:  Trace edema; no marked dermal changes nor surface  varicosities.   IMPRESSION:  Mr. Gardy is improving on all fronts with weight reduction.  Blood pressure control is good on minimal medication.  Diabetic control  likewise is superb.  A recent lipid profile in your office was not ideal.  His venous insufficiency sounds as if it is markedly improved.   I assume that Warfarin was being used for treatment of venous insufficiency  and perhaps prior deep venous thrombosis.  Since edema and other findings  have improved so much, his risk of recurrence is probably markedly reduced.  I have asked him to discontinue Warfarin for now.   It is not clear that he needs medication for hypertension, especially the  minimal dose of Ramipril with which he is currently being treated.  Unless  he has microalbuminuria, he does not appear to need ACE inhibitors.  He will  stop Altace and submit a urine specimen for testing.   He probably does not need Demadex, at least at his current dose, but we will  defer adjustment of that medication.   His  diabetes also probably can be controlled with an oral agent or perhaps  with no medication at all.  You may wish to discontinue insulin and switch  him to one or more oral medications.   A lipid profile will be checked in two months.  I will reassess this nice  gentleman again in six months.  Thank you so much for sending him back to  [facility].   Sincerely,       Cristopher Estimable. Lattie Haw, MD, [facility]   RMR/MedQ  DD:  03/03/2006  DT:  03/04/2006  Job #:  [id number]

## 2010-10-18 NOTE — Discharge Summary (Signed)
NAMESWAYDE, EMANUELSON                       ACCOUNT NO.:  [id number]   MEDICAL RECORD NO.:  [id number]                   PATIENT TYPE:  INP   LOCATION:  A228                                 FACILITY:  [facility]   PHYSICIAN:  [name], M.D.                DATE OF BIRTH:  07-07-1960   DATE OF ADMISSION:  01/27/2003  DATE OF DISCHARGE:  02/05/2003                                 DISCHARGE SUMMARY   CHIEF COMPLAINT:  The patient is a 50 year old separated, self-employed  black male from [place], [place].  His chief complaints were  general malaise, increased thirst, increased frequency of urination, dry  mouth, left leg pain, and redness of the left leg.  The left leg had been  hurting x24 hours.  A history of positive increased frequency of urination  for the past 24 hours.   HISTORY OF PRESENT ILLNESS:  The patient's history was significant for  chronic left leg ulcers.  The ulcers were being managed by vascular surgeon,  Dr. Epifania Gore. Nils Pyle, since July 2003.   PAST MEDICAL HISTORY:  1. Hypertension.  2. Type 2 diabetes mellitus, on insulin, under the supervision of     endocrinologist, Dr. Jeanann Lewandowsky.  3. Morbid obesity.  4. Coronary artery disease.  5. Chronic venous insufficiency.  6. Positive for hospitalization for a ruptured left scrotal abscess in     October 1998.  7. Hospitalization for a left periurethral scrotal abscess, treated with     incision and drainage on August 20, 1995, by Dr. _______.  8. New onset of diabetes mellitus in October 1994.  9. Excision of carbuncle in 1983, at [facility].  10.      Back sprain in 1987, at [facility] in [place], [place].  11.      Right kidney swelling with cellulitis in January  1992.  12.      A myocardial infarction in January  2002.   ALLERGIES:  No known drug allergies.   SOCIAL HISTORY:  HABITS:  Negative for street drug use.  Positive for former  cigarette smoker  x20 years.  The patient did use ethanol socially.   FAMILY HISTORY:  Mother living at age 32, with a history of coronary artery  disease.  Father deceased in his 38s, secondary to complications of diabetes  mellitus.  One brother living at age 36, with a history of diabetes  mellitus.  One brother deceased at age 20, secondary to heart attack.   PHYSICAL EXAMINATION:  VITAL SIGNS:  Pulse 100, respirations 26, blood  pressure 150/80 in the office.  Vital signs on the floor at the time of  admission were as follows:  Temperature 102.4 degrees, blood pressure  135/81, pulse 82, respirations 20.  Weight 513 pounds.  GENERAL:  A middle-aged, tall overweight black male, who appeared not to  feel well, but  in no apparent respiratory distress.  LUNGS:  Clear.  HEART:  Audible S1, S2 without murmur.  Rate 100, rhythm regular.  EXTREMITIES:  (Lower), demonstrated 1+ edema of the right tibia, +2 edema of  the left tibia.  The left tibia was positive for confluent distal hyper-  pigmentation.  Left tibia also demonstrated paroxysmal confluent redness  with increased warmth on palpation.  The patient had two irregular grade 2  ulcers involving the distal left tibia.  The right tibia demonstrated a  healed ulcerated area.  The feet demonstrated palpable dorsalis pedis  bilaterally with pitting edema.  The patient was neurologically intact.   LABORATORY DATA:  White count 12.3, hemoglobin 14.3, hematocrit 42.5,  platelets 215,000.  A chest x-ray on admission demonstrated cardiomegaly with mild perihilar  congestive changes.  An ultrasound of the left lower extremity demonstrated no evidence for deep  vein thrombosis.  Prothrombin time on admission 13.6, INR 1.1, PTT 39.   HOSPITAL COURSE:  Problem #1 - CHRONIC VENOUS INSUFFICIENCY WITH LEFT LEG  STASIS DERMATITIS, LEFT LEG CHRONIC VENOUS STASIS ULCERS AND ACUTE  CELLULITIS:  The patient was treated with elevation of both extremities,  Demadex 40  mg p.o. daily, Bactroban ointment to the ulcers plus dressings,  Levaquin 750 mg p.o. daily, and a consultation by Dr. Epifania Gore. Nichols.  The patient responded to therapy with continuity of this treatment plan.  The redness of the lower extremity was resolved.  The patient experienced a  significant decrease in edema of both lower extremities.  The ulcers were  cleaner.  The left lower extremity was not as painful as on admission.  The  patient was alert and oriented to person, place, and time.  He was afebrile.  A repeat white count on February 01, 2003, was 10.1, hemoglobin 13.3,  hematocrit 40.4, platelets 254,000.  Dr. Nils Pyle, the vascular surgeon, did  see the patient on January 31, 2003, and his assessment was cellulitis with  questionable bacterial infection.  He ordered a culture of the open ulcers  of the left leg.  Also he changed dressings to wet to dry.  He advised the  continuation of Coumadin prophylactic for a deep vein thrombosis.  Dr.  Nils Pyle felt that the patient was a high risk for a deep vein thrombosis due  to his venous stasis.  Cultures demonstrated no growth x2 days.  The patient  was discharged home on Levaquin 750 mg p.o. daily.  The last prothrombin  time was 15.0 on February 04, 2003, with an INR of 1.3.  The patient was  receiving Coumadin 10 mg p.o. daily at 5 p.m.  He was not experiencing any  bleeding gums, hemoptysis, hematemesis, gross hematuria, or ecchymotic  lesions.  The patient will have a repeat prothrombin time and INR on  February 07, 2003, in the office.  The patient was much improved, pertaining  to his presentation of the left leg with multiple ulcers and cellulitis at  the time of admission.  He was discharged to his home on February 05, 2003,  where he lives with his mother.   Problem #2 - HYPOXIA WITH RESTRICTIVE LUNG DISEASE, SECONDARY TO MORBID  OBESITY:  ABG's on room air on January 27, 2003, at [place] hours demonstrated the following:  pH  7.39, pCO2 of 52.6, pO2 of 43.3, O2 saturation 80.1%.  The patient was treated with 1-1/2 L of oxygen, Xopenex nebulizer treatment,  and other supportive measures.  The patient responded to therapy.  At the  time of admission he was easy to fall asleep.  Repeat ABG's on January 31, 2003, at 1015 on room air demonstrated the following:  pH 7.46, pCO2 of  42.1, pO2 of 72.4, O2 saturation 96.5%.  It should be noted that hypoxia  could have been complicated by some congestive heart failure.  A chest x-ray  demonstrated cardiomegaly with mild perihilar congestive changes on January 31, 2003.  An electrocardiogram on admission demonstrated normal sinus  rhythm, inferior Q-waves in lead II, III, aVF, V1, V2 and V3, consistent  with old inferior anteroseptal infarction.  Cardiology did see the patient  during this hospitalization.  Dr. Scarlett Presto, cardiologist, said that  the patient was morbidly obese with a documented weight above 500 pounds.  He indicated that he was too heavy to use any imaging modality other than an  echocardiogram with contrast.  He indicated that he had congestive heart  failure, probably from his ischemic cardiomyopathy.  His coronary artery  disease, status post myocardial infarction in 2002, with primary angioplasty  to his left anterior descending, and now had a diminished ejection fraction  of 35% with anterior scar.  He also indicated that the patient had severe  lower extremity peripheral vascular disease.  He indicated at this point  there was little to do other than medical management.  He recommended  increasing his medication for better blood pressure control.  He said that a  beta natriuretic peptide level would be a reasonable thing to get on the day  of his evaluation.  He stated that he will follow the patient as needed.  The patient's beta natriuretic peptide during this hospitalization revealed  a value of 86.6 picogram per ml, 0-100.  The patient  responded to diuretic  therapy.  His electrolytes run on February 01, 2003, revealed the following:  Sodium 135, potassium 4.9, chloride 94, CO2 of 33, BUN 16, creatinine 1.2.  The last weight recorded for the patient was 490 pounds on February 02, 2003.   Problem #3 - TYPE 2 DIABETES MELLITUS:  Serum glucose on admission was 392.  Hemoglobin A1c was 13.6.  The patient was treated with calorie restriction,  sliding scale, frequent Accu-Cheks.  Moreover, he was continued on  Glucophage 500 mg p.o. with breakfast and supper.  The glucose was brought  down steadily during this hospitalization.  Fasting blood sugar on the  morning of February 04, 2003, was 196.  The patient was alert and oriented  to person, place and time at the time of discharge.  He was not falling  asleep during conversations.  He was much more engaging with his  environment.  O2 saturation on room air was 98%.  The dietitian did see the  patient during this hospitalization.  Problem #4 - MORBID OBESITY:  The patient's weight on admission was 513  pounds.  The last weight recorded was 485.9 pounds on February 05, 2003.  The patient had been started on Xenical 120 mg p.o. t.i.d. with each meal  and Meridia 10 mg p.o. daily before discharge, on January 31, 2003.  Moreover, the dietitian has been planning his meals.   Problem #5 - OBSTRUCTIVE SLEEP APNEA:  The patient was on oxygen initially  24 hours at the time of admission.  At the time of discharge he was on  oxygen from 1900 to 0700 hours daily at 1-1/2 L.  He was receiving Xopenex  nebulizer treatments q.8h. throughout his hospitalization.  He really  improved as far as his hypoxic status and particularly a change in falling  asleep.  The patient had a CPAP machine at home.  He was advised to continue  the use of this.  Moreover, he was advised to see a pulmonologist.   DISCHARGE INSTRUCTIONS:  1. Diet:  2200 calories, low-salt, low-cholesterol diet.  2. Activity:   Keep feet elevated at rest.   DISCHARGE MEDICATIONS:  1. Levaquin 750 mg, one tab p.o. daily.  2. K-Dur 20 mEq, two tab p.o. daily.  3. Altace 2.5 mg, one cap p.o. daily.  4. Lopressor 50 mg, one tab b.i.d.  5. Novolin 70/30, 40 units subcutaneously q.a.m. before breakfast and at     supper.  6. Zocor 40 mg, one tab p.o. daily at bedtime.  7. Coumadin 10 mg p.o. daily at 5 p.m.  8. Demadex 20 mg, two tab p.o. daily.  9. One aspirin 81 mg p.o. daily.  10.      Protonix 40 mg, one tab p.o. daily.  11.      Allegra 180 mg p.o. daily.  12.      Meridia 10 mg p.o. daily.  13.      Xenical 120 mg p.o. t.i.d. with meals.  14.      Fortamet (metformin), 500 mg p.o. daily with breakfast and supper.  15.      Tylox one tab p.o. q.6h. p.r.n. pain.   FOLLOWUP:  1. Follow up in the office in one week.  2. Follow up with Dr. Nils Pyle on February 07, 2003.  3. Accu-Chek at 7 a.m., 5 p.m. and 10 p.m. daily.   FINAL PRIMARY DIAGNOSES:  1. Chronic venous stasis dermatitis with left leg ulcers and left leg     cellulitis.  2. Hypoxia, secondary to congestive heart failure, secondary to ischemic     cardiomyopathy.   SECONDARY DIAGNOSES:  1. Morbid obesity.  2. Obstructive sleep apnea.  3. Type 2 diabetes mellitus, on insulin.  4. Hyperlipidemia.  5. Coronary artery disease, status post myocardial infarction.   ADDENDUM:  The patient's hemoglobin A1c was 13.6% (normal 4.6-6.1).  Lipid  profile on January 28, 2003, at 0534 hours:  Total cholesterol 151,  triglycerides 156 mg/dL, LDL 82 mg/dL, HDL 38 mg/dL.  Wound culture  demonstrated no growth, collected on January 31, 2003.                                               [name], M.D.    Armanda Heritage  D:  02/06/2003  T:  02/06/2003  Job:  [id number]

## 2010-10-18 NOTE — Consult Note (Signed)
NAMEJIAIR, JOANIS                       ACCOUNT NO.:  [id number]   MEDICAL RECORD NO.:  [id number]                   PATIENT TYPE:  INP   LOCATION:  [id number]                                 FACILITY:  [facility]   PHYSICIAN:  Scarlett Presto, M.D.                DATE OF BIRTH:  23-May-1961   DATE OF CONSULTATION:  01/30/2003  DATE OF DISCHARGE:                                   CONSULTATION   PRIMARY CARE Marjarie Irion:  [name], M.D.   PRIMARY CARDIOLOGIST:  Marijo Conception. Wall, M.D.   REASON FOR CONSULTATION:  Mr. Filbrun is a 50 year old African-American  gentleman with severe morbid obesity and coronary artery disease who  presented to [name] on January 27, 2003 with a lower extremity worsening  ulcer.  He also complains of mild chest fullness and shortness of breath,  and dyspnea on exertion.  This is a consult to assess for the dyspnea on  exertion which has been improving since his hospitalization.   HISTORY OF PRESENT ILLNESS:  Mr. Lemons has coronary artery disease.  He  is status post an anterior wall myocardial infarction in June 2002 with an  occluded left anterior descending coronary artery which received a stent.  He then had angioplasty to his second diagonal as well.  He has obstructive  sleep apnea, diabetes, morbid obesity with significant weight over 500  pounds, hyperlipidemia, lower extremity ulcers, hypertension, and a dilated  ischemic cardiomyopathy with congestive heart failure.   SOCIAL HISTORY:  He lives in [place].  He is disabled.  He is separated  from his wife.  He has no children.  He has a previous smoking history of 20  pack years, quit in the 1990s.  Does not drink alcohol, does not use drugs.   FAMILY HISTORY:  His father is alive at age 84 but has coronary disease.  His mother died in her 38s of diabetes.  He has one brother who is 16 with  diabetes and one brother who died at age 55 of a myocardial infarction.   ADMISSION MEDICATIONS:  1. Demodex 40 mg a day.  2. Potassium 40 mEq a day.  3. Altace 2.5 mg a day.  4. Lopressor 50 mg twice a day.  5. Zocor 40 mg a day.  6. Aspirin 81 mg a day.  7. Tylox two tablets q.4h. p.r.n. pain.  8. Fioricet 500 mg at breakfast and at supper.  9. Allegra 180 mg a day  10.      Insulin sliding scale 70/30 mix.   He recently had an echocardiogram on January 26, 2003 which showed an  ejection fraction of 35% with severe hypokinesis and akinesis of the  anterior wall.  In the hospital he is on aspirin, Colace, Robitussin,  heparin, insulin - both sliding scale and a fixed mixed dose, Xopenex,  Levaquin, and Glucophage.   REVIEW OF SYSTEMS:  Essentially unremarkable except  for generalized malaise  and leg pain.   PHYSICAL EXAMINATION:  GENERAL:  He is a morbidly-obese man who weighs 513  pounds.  VITAL SIGNS:  His temperature is 99.2, his pulse is 71, his respiratory rate  is 18, his blood pressure is 159/81.  HEENT:  Examination is unremarkable.  NECK:  Full and whether he has any significant jugular venous distention is  unclear.  He does not have any carotid bruits.  CHEST:  Has decreased breath sounds at the bases though this may be  secondary to obesity.  There are no obvious rales.  CARDIAC:  Distant but appears to be regular.  ABDOMEN:  Obese but soft and nontender.  LOWER EXTREMITIES:  He has a left lower extremity ulcer which is wrapped in  gauze.  He has chronic stasis changes in both lower extremities with  diminished venous pulses.  NEUROLOGIC:  Basically nonfocal.   LABORATORY DATA:  He had a lower extremity ultrasound which showed no DVT.  However, his superficial femoral vein was not well visualized.  He has  cardiomegaly with perihilar congestion on his admission chest x-ray.  His  electrocardiogram shows sinus rhythm at a rate of 96 with a normal axis.  He  has an old inferior and an old anterior wall myocardial infarction but no  acute ischemic changes.  White  blood cell count was 12.3 with an H&H of 14  and 42, platelet count of 215.  Sodium of 131, potassium of 4.1, chloride of  93, bicarb of 34, BUN and creatinine are 17 and 1.2 with a blood sugar of  279.  Liver function studies are within normal limits.  His hemoglobin A1c  is 13.6.  Lipids:  Total cholesterol is 151 with a triglycerides of 156, HDL  of 38, LDL of 82.  He had a blood gas on room air which showed a pH of 7.39,  PCO2 of 52, PO2 of 43, 80%.   IMPRESSION:  So essentially we have a gentleman with morbid obesity with a  documented weight above 500 pounds.  He is too heavy to use any imaging  modality other than echocardiogram with contrat.  He has congestive heart  failure, probably from his ischemic cardiomyopathy.  He has coronary artery  disease status post a myocardial infarction in 2002 with primary angioplasty  to his left anterior descending and now has a diminished ejection fraction  at 35% with an anterior scar, and he also has severe lower extremity  peripheral vascular disease.  I think, really, at this point there is little  to do other than medical management.  I would recommend up-titrating his  ramipril to control his blood pressure a little bit better.  Consideration  can be made for some diuresis by adding some IV Bumex as opposed to p.o.  Demodex.  I think a BNP level would be a reasonable thing to get today and  then follow it serially as a way to sort of monitor how significant his  congestive heart failure is.  I think if we get one today and get one either  Wednesday or Thursday - depending on how long he is in the hospital - would  give [place] a sense for how we are doing with that.  Scarlett Presto, M.D.    JH/MEDQ  D:  01/30/2003  T:  01/30/2003  Job:  [id number]

## 2010-10-18 NOTE — Procedures (Signed)
   NAME:  [name], [name]                       ACCOUNT NO.:  [id number]   MEDICAL RECORD NO.:  [id number]                   PATIENT TYPE:  OUT   LOCATION:  RAD                                  FACILITY:  [facility]   PHYSICIAN:  Jacqulyn Ducking, M.D.               DATE OF BIRTH:  1961/02/01   DATE OF PROCEDURE:  01/26/2003  DATE OF DISCHARGE:                                  ECHOCARDIOGRAM   REFERRING PHYSICIANS:  [name], M.D. and Marijo Conception. Wall, M.D.   CLINICAL DATA:  A 50 year old gentleman with obesity and sleep apnea  suggesting pulmonary heart disease.   FINDINGS/IMPRESSION:  1. A technically difficult and somewhat limited echocardiographic study. IV     echocardiographic contrast was utilized to improve assessment of left     ventricular size and function.  Mild left atrial enlargement.  2. Normal right ventricular size; no RVH.  Mildly impaired RV systolic     function.  3. Mild aortic valvular sclerosis.  4. Normal mitral valve; mild annular calcification.  5. Normal pulmonic valve.  6. Mild left ventricular dilatation; severe hypokinesis to akinesis of the     mid-and-distal anteroseptal segment; apical akinesis; moderately impaired     overall LV systolic function.  Estimated ejection fraction is 0.35.  7. Comparison with prior study of November 18, 2000:  No significant interval     change.                                               Jacqulyn Ducking, M.D.    RR/MEDQ  D:  01/26/2003  T:  01/27/2003  Job:  [id number]

## 2010-10-18 NOTE — Op Note (Signed)
[name], [name]             ACCOUNT NO.:  [id number]   MEDICAL RECORD NO.:  [id number]          PATIENT TYPE:  AMB   LOCATION:  DAY                          [facility]   PHYSICIAN:  Fenton Malling. Lucia Gaskins, M.D.  DATE OF BIRTH:  1960-10-29   DATE OF PROCEDURE:  06/09/2006  DATE OF DISCHARGE:                               OPERATIVE REPORT   PREOPERATIVE DIAGNOSIS:  Infected lap band with protruding tubing.   POSTOPERATIVE DIAGNOSIS:  Erosion of lap band into stomach with  infection of entire lap band system.   PROCEDURE:  Removal of reservoir, laparoscopic exploration, and upper  esophagogastroduodenoscopy.   SURGEON:  Fenton Malling. Lucia Gaskins, M.D.   FIRST ASSISTANT:  Isabel Caprice. Hassell Done, MD   ANESTHESIA:  General endotracheal.   ESTIMATED BLOOD LOSS:  Minimal.   INDICATIONS FOR PROCEDURE:  Mr. Rapson is a 50 year old black male,  patient of Dr. [name], who had a laparoscopic band placement in  [place], [place] by Dr. Darylene Price in January, 2006.  At that time, he  weighed 525 pounds with a BMI of 65.6 but within about two weeks of  coming back, this wound got infected.  He has been managed by Irving Shows in [place].  Dr. [name] is no longer in [place] and he came  to [place] for further evaluation.  Patient had a protruding band tubing in  the left upper qudrant.  I spoke with him about the options of trying to  save this band versus removing the band.  I also spoke to Williams Che,  the lap band representative for [employer], who recommended removal of the  band.  Patient desired removal of the band.   The potential complications and procedure were explained to the patient.  The potential complications include but are not limited to bleeding,  infection, which I think he already has, the inability to remove the  band and the possiblity of open abdominal surgery.   OPERATIVE NOTE:  Patient placed in a supine position, given a general  anesthetic.  His abdomen was prepped  with Betadine solution.  He was  given 1 gm of Ancef at the initiation of the procedure.   The band tubing protudes through an open wound in the left upper  quadrant at his costal margin.  I  removed the reservoir and put a clamp  on the tubing.  I then accessed the abdominal cavity with a Optiview in  the left upper quadrant.  I placed a 10 mm Ethicon Optiview.  I placed a  left paramedian 12 mm trocar, right paramedian 2 mm trocar, and a 5 mm  right subcostal trocar.  I took down and dissected some adhesions from  the anterior abdominal wall.  I found the tubing, which was thickly  encased with scar tissue along the left upper peritoneal surface and the  tubing has been well isolated.   I tracked this up, but as I got up closer to the hiatus, it was clear  that the band was severely inflamed and chronically encased in scar  tissue.  At this point, Dr. Hassell Done broke scrub,  and he did upper  endoscopy.  On endoscopy he found that the patient has erosion of his  band into the stomach.  In retrospect, I think  that Mr. Lilja  possibly had an injury to the stomach with his initial band placement,  and this prompted the early infection.   At this point, I spoke to Williams Che on the phone, who got [place] in touch  with Azzie Roup, who is in [place], [place].  Dr. Hassell Done was  actually the one who talked to Dr. Marijean Bravo on the phone.  He made some  suggestions about possible management of this band.   It was clear, at least from the laparoscopic approach, it would be very  difficult to get this band out and would create a significant  risk to  his proximal stomach.  It appeared that a possible endoscopic approach  would be possible, but we did not have the tools nor were prepared to  try this at this time during the day.   I thought the best course was to transect the tube and drop into his  peritoneal cavity.  We placed a Prolene suture in case this again got  encased in the scar  tissue.  We insufflated the stomach under pressure  multiple times to try and see if there was any leak around the catheter,  which there was none.  There was no free leak of fluid from this cath.  I think it is really well walled-off and contained.   The trocars were then removed.  In turn, again, I took pictures and put  these in the chart.  I closed each would with 5-0 vicryl except for the  left upper quadrant wound that was infected.  The sponge and needle  count were correct at the end of the case.   I think the decision is what to do now regarding this eroded band, which  I have discussed with the patient the options for trying to manage in  [place] or finding a place where it can be managed and have this  taken out.      Fenton Malling. Lucia Gaskins, M.D.  Electronically Signed     DHN/MEDQ  D:  06/09/2006  T:  06/09/2006  Job:  [id number]   cc:   [name], MD  Fax: [phone]   Cristopher Estimable. Lattie Haw, MD, [facility]  [address]  [place], [place] [address]   Jeanann Lewandowsky, M.D.  Fax: [phone]

## 2010-10-18 NOTE — Letter (Signed)
June 12, 2006    [name], MD  [address]  [place], [place] [address]   RE:  [name], [name]  MRN:  [id number]  /  DOB:  Apr 06, 1961   Dear [name],   It was a pleasure reassessing Mr. Penniman in the office today, at your  request.  As you know, he has reported a few episodes of jaw discomfort.  This begins as an unusual feeling in his abdomen and then develops to a  sense of pressure in his ears and some discomfort in the jaws.  The  magnitude is quite modest.  Duration is, at most, a few minutes.  There  is no associated dyspnea, diaphoresis, nor chest discomfort.  There is  no relationship to exertion.  Otherwise, he has done fairly well.  He  underwent surgery to remove his laparoscopic band; the reservoir and  tubing were extracted, but there apparently was difficulty in removing  the band itself.  A subsequent surgical procedure is planned.  Mr.  Calma has been packing the wound and has felt generally well since  the surgery, three days ago.  He continues to have some incisional  discomfort.   CURRENT MEDICATIONS INCLUDE:  1. Aspirin 81 mg daily.  2. Lantus insulin per your directions.  3. Simvastatin 40 mg daily.  4. Ramipril 10 mg daily.  5. Doxycycline b.i.d.  6. Cipro 500 mg b.i.d.   ON EXAM:  A pleasant, overweight gentleman, in no acute distress.  The  weight is 395, four pounds more than in October.  Blood pressure 110/70,  heart rate 75 and regular, respirations 16.  NECK:  No jugular venous distention.  ENDOCRINE:  Mild diffuse thyromegaly.  LUNGS:  Decreased breath sounds at the bases; otherwise clear.  CARDIAC:  Distant first and second heart sounds; modest systolic murmur.  ABDOMEN:  Soft and nontender; multiple laparoscopic incisions.  EXTREMITIES:  No edema.   IMPRESSION:  Mr. Dentino is doing well, overall.  His micro-albumin  level was 40, prompting [place] to resume ACE inhibitor.  He does not appear  to require much in the way of diuretic.  We  will reduce torsemide to 20  mg daily.  While jaw discomfort is certainly worrisome for possible  coronary disease, Mr. Skoog's size continues to preclude noninvasive  assessment.  He does not warrant to repeat coronary angiography.  I have  advised him to report any recurrent symptoms to you and to me.  If an  episode is substantially more prolonged or severe, he will call 911 for  evaluation in the emergency department.  I will see this nice gentleman  again in three months, as planned.    Sincerely,      Cristopher Estimable. Lattie Haw, MD, [facility]  Electronically Signed    RMR/MedQ  DD: 06/12/2006  DT: 06/12/2006  Job #: [id number]

## 2010-10-18 NOTE — H&P (Signed)
[facility]  Patient:    [name], [name] Visit Number: [id number] MRN: [id number]          Service Type: DSU Location: DAY Attending Physician:  Delorise Jackson Dictated by:   Irving Shows, M.D. Admit Date:  04/02/2001                           History and Physical  HISTORY OF PRESENT ILLNESS:  [age]-year-old male with history of recurrent left scrotal abscess with some episodes of bacteriemia.  He has severe diabetes mellitus.  Patient was scheduled to have the area excised in June but he suffered a myocardial infarction while hospitalized and required cardiac catheterization.  He has recovered from the MI and is doing quite well.  He has had recurrence of the abscess at least three times since June.  He is now admitted to have it fully excised.  The abscess has inguinal extension and the area may need to be widely excised and left open.  An attempt will be made to try to close it primarily.  PAST HISTORY:  The patient has coronary artery disease and is status post acute myocardial infarction, with cardiac catheterization showing a totally occluded LAD, which was opened with a balloon and stented completely open.  He had a 95% stenosis at the origin of the second diagonal, 60% lesion in the mid-vessel of the right coronary with an ejection fraction of 35% with akinesis of the anterior wall and dyskinesis of the apex and inferior wall. The second diagonal was opened to a 50% stenosis with excellent flow.  The patient has done well post angioplasty.  He was on cardiac rehab but he had difficulty because of hurting his knee.  He rarely has chest pain.  Another major problem is morbid obesity, with weight varying between 440 and 450 pounds.  MEDICATIONS: 1. Aspirin 325 mg q.d. 2. Lopressor 50 mg b.i.d. 3. Zocor 20 mg q.h.s. 4. Altace 2.5 mg q.d. 5. Cleocin 300 mg q.i.d. 6. Duricef 500 mg b.i.d. 7. Humulin N 50 units q.a.m. 8. Celebrex 200 mg  two q.d. p.r.n.  ALLERGIES:  He has no known drug allergies.  PHYSICAL EXAMINATION:  GENERAL:  He is an obese, pleasant male in no acute distress.  VITAL SIGNS:  Blood pressure 120/80, pulse 68, respirations 22.  HEENT:  Unremarkable.  NECK:  Supple.  CHEST:  Clear to auscultation.  HEART:  Regular rate and rhythm without any murmur, gallop or rub.  ABDOMEN:  Obese, soft, nontender.  There are no palpable masses.  EXTREMITIES:  Early venous stasis changes of lower legs bilaterally with 1+ edema.  GENITALIA:  Small resolving right scrotal abscess and a large chronic left scrotal lesion.  Testicles are not involved.  NEUROLOGIC:  No motor, sensory or cerebellar deficit.  IMPRESSION: 1. Bilateral scrotal abscesses. 2. Non-insulin-dependent diabetes mellitus. 3. Atherosclerotic heart disease with multivessel stenosis, status post    angioplasty and stent. 4. Morbid obesity.  PLAN:  Wide excision and possible closure of bilateral scrotal abscesses. Dictated by:   Irving Shows, M.D. Attending Physician:  Delorise Jackson DD:  04/01/01 TD:  04/02/01 Job: [id number]

## 2010-10-18 NOTE — Procedures (Signed)
[name], OHLMANN             ACCOUNT NO.:  [id number]   MEDICAL RECORD NO.:  [id number]          PATIENT TYPE:  AMB   LOCATION:  DAY                           FACILITY:  [facility]   PHYSICIAN:  Edward L. Luan Pulling, M.D.DATE OF BIRTH:  02/19/1961   DATE OF PROCEDURE:  DATE OF DISCHARGE:                                EKG INTERPRETATION   The computers read this as atrial fibrillation, but this is sinus rhythm  with a rate in the 60s. There are Q-waves inferiorly suggesting a previous  inferior infarction and slow R-wave progression across the precordium,  suggesting a previous anterior infarction. Note of the patient's height and  weight, some of the changes may be related to body habitus. Abnormal  electrocardiogram.       ELH/MEDQ  D:  11/14/2004  T:  11/15/2004  Job:  [id number]

## 2010-10-18 NOTE — Discharge Summary (Signed)
[place]  Patient:    [name], [name]                    MRN: [id number] Adm. Date:  [id number] Disc. Date: [id number] Attending:  Garth Bigness Dictator:   Arn Medal, P.A.-C. CC:         [name], M.D. - [id number], [place], [place] [address]                           Discharge Summary  DISCHARGE DIAGNOSES: 1. Myocardial infarction, status post cardiac catheterization with    percutaneous coronary intervention. 2. Scrotal abscess.  HISTORY OF PRESENT ILLNESS:  [name] is a very pleasant 50 year old male who has been treated at [facility] for a left groin/scrotal abscess. A surgical I&D was planned for November 06, 2000; however, on the morning of November 05, 2000, he reported chest and jaw pain.  Subsequent cardiac enzymes were positive for a myocardial infarction.  HOSPITAL COURSE:  He was transferred to [facility] for a cardiac catheterization.  The patient was started on IV heparin as well as Integrilin.  On November 06, 2000, the patient was taken to the cardiac catheterization laboratory by Dr. Elta Guadeloupe Pulsipher.  CATHETERIZATION RESULTS: 1. Left main coronary artery:  The left main coronary artery has a proximal    20% stenosis. 2. Left anterior descending coronary artery:  The left anterior descending    coronary artery has a 100% occlusion proximally before a large second    diagonal.  After opening of this lesion by a balloon angioplasty and    stent, (the occlusion was reduced to a 0% residual with TIMI-3 flow);    the patient was noted to have a 95% stenosis at the origin of the second    diagonal, with thrombus and diffuse 25% stenosis in the mid-LAD.  The    LAD was also noted to be a very lengthy vessel curling the apex and    supplying the apical portion of the inferior wall.  The lesion was    reduced with a balloon from 95% to 50%, with TIMI-3 flow. 3. Left circumflex coronary  artery:  The left circumflex is codominant.    The ramus intermediate was large with a 20% stenosis.  The circumflex    then gave rise to a small OM-I, a normal-sized OM-II, and a small PL    branch. 4. Right coronary artery:  A small codominant vessel.  A 60% rather hazy    stenosis in the midvessel.  The distal right coronary artery gave rise    to a small PDA and a small PL branch.  LEFT VENTRICULOGRAM:  Severe akinesis of the anterior wall, severe akinesis/dyskinesis of the apical wall, moderate akinesis of the inferior apical wall.  Ejection fraction estimated to be 35%.  There is no mitral regurgitation.  As a result of the large amount of contrast required, the patient was aggressively hydrated.  Integrilin was continued for 24 hours.  Dr. Vicenta Aly also recommended consideration of Coumadin for the anterior apical akinesis on ventriculography.  Later that day the patient was seen by Dr. Earnstine Regal from general surgery.  He noted that the perineal abscess had resolved.  The left groin apocrine sweat gland abscess was spontaneously draining.  He recommended changes from IV Ancef to p.o. Cipro 750 mg b.i.d. for seven days.  He further recommended followup with the patients local physician.  The next day the patient denied any chest pain or shortness of breath.  Over the next couple of days the patient did well, with no further chest pain or shortness of breath.  There were no complications secondary to the cardiac catheterization intervention.  On November 09, 2000, the patient is seen by Dr. Carlena Bjornstad.  He thought that the patient is doing well and is stable for discharge.  He deferred starting the patient on Coumadin, secondary to the patient being on aspirin and Plavix. He recommended a 2-D echocardiogram in approximately one week at [facility].  He felt that the final decision of Coumadin would be best left up to Dr. Marcello Moores C. Wall at the patients follow-up  appointment on November 23, 2000.  DISCHARGE MEDICATIONS: 1. Plavix 75 mg q.d. for four weeks. 2. Enteric-coated aspirin 325 mg q.d. 3. Lopressor 50 mg b.i.d. 4. Nitroglycerin 0.4 mg p.r.n. 5. Insulin as previously taken. 6. Cipro 750 mg b.i.d. until gone. 7. Zocor 20 mg q.h.s.  DISCHARGE INSTRUCTIONS:  The patient is advised to avoid lifting, driving, sexual activity, or heavy exertion until seen by Dr. Verl Blalock.  He is to watch the catheterization site for any pain, bleeding, or swelling, drainage, or bruising.  To call the [facility] if he has any of these problems.  He is asked to stop smoking.  FOLLOWUP:  He is advised to keep a diary of blood sugars and follow up with Dr. [name].  He is advised to work further with Dr. [name] on obtaining a scale which will accommodate him (estimated patient weight of 440 pounds).  He is to follow up with Dr. [name].  He will need to make an appointment on discharge.  He is to get a cardiac ultrasound at [facility] on the Monday after discharge.  He is to follow up with Dr. Verl Blalock in the [facility] on Monday, November 23, 2000, at 11:30 a.m.  DIET:  He is advised to follow a low-fat, low-salt, low-cholesterol ADA diet.  LABORATORY DATA:  White count 13.1, hemoglobin 12.4, hematocrit 36.6, platelets 303.  Sodium 133, potassium 3.8, chloride 101, CO2 of 29, BUN 10, creatinine 1.0, glucose 145.  Cardiac enzymes:  CPK 2013, MB 104.8, index 5.2. Peak troponin I 9.63.  Urinalysis revealed trace hemoglobin, many epithelial cells, and 30+ protein.  Chest x-ray on November 05, 2000, was rather limited due to body habitus.  There was noted to be prominent cardiomegaly without frank pulmonary edema. Radiology recommended follow-up PA and lateral chest for further delineation of underlying lung parenchyma.  The costophrenic angles were not visualized.  Electrocardiogram revealed a sinus rhythm at 86, with a first-degree AV block. There was also  noted to be a rightward access.  PR interval was 0.224, QRS 0.102, QDC 0.464, axis 102. DD:  11/09/00 TD:  11/09/00 Job: [id number]

## 2010-10-18 NOTE — H&P (Signed)
[name], [name]             ACCOUNT NO.:  [id number]   MEDICAL RECORD NO.:  [id number]          PATIENT TYPE:  INP   LOCATION:  A319                          FACILITY:  [facility]   PHYSICIAN:  Vernon Prey. [name], M.D.   DATE OF BIRTH:  1960/09/18   DATE OF ADMISSION:  07/27/2004  DATE OF DISCHARGE:  LH                                HISTORY & PHYSICAL   HISTORY:  [age]-year-old male who presents to the emergency room with a  draining abscess in his left upper quadrant.  The patient gives a history of  undergoing a laparoscopic adjustable gastric banding procedure on January  27.  This was done in [place], [place].  He was monitored overnight and then  discharged to a hotel where he stayed for 24 hours and sent home.  The  patient states that he has done very well.  He has over 80 pound weight loss  but he noted over the past week that he has had increasing swelling in the  upper paramedian incision.  The incision became more painful and reddened  and it started to drain today.  He came to the emergency room where he had  an obvious abscess with a tender swollen area in the left upper quadrant.  The patient is admitted.  We will treat the wound by opening and draining  the abscess and proceeding with local wound care.  I am not quite sure where  the port for the adjustment of the band is located and we will have to wait  until we get some information from the treating hospital if this possible.   PAST HISTORY:  The patient has severe atherosclerotic heart disease.  He is  status post myocardial infarction.  He also has insulin-dependent diabetes  mellitus, sleep apnea, chronic venous insufficiency.   MEDICATIONS:  1.  Coumadin 10 mg once daily.  2.  Altace 2.5 mg once daily.  3.  Aspirin 81 mg once daily.  4.  Potassium chloride 20 mEq two every morning.  5.  Metoprolol 50 mg b.i.d.  6.  Torsemide 20 mg two once daily.  7.  Metformin 1000 mg b.i.d. with meals.  8.  He also  uses pork insulin 70/30 50-60 units in the morning and 30-40      units in the evening.  9.  He takes an aspirin once a day.  10. He uses Altace 2.5 mg once a day.   OTHER HISTORY WITH REGARD TO HIS HEART DISEASE:  Echocardiogram in August  2004 showed left ventricular dilatation with severe hypokinesis and akinesis  of the mid and distal anteroseptal segment with apical akinesis with  impaired left ventricular systolic function and ejection fraction of 35%.  His coronary angiogram in 2002 revealed 100% occlusion of the left anterior  descending with a 95% stenosis at the origin of the second diagonal.  The  left circumflex was a codominant vessel with only 20% stenosis and it gave  rise to two obtuse marginal branches and a small posterolateral branch.  The  right coronary artery was relatively small and had a 60% stenosis  in the mid  vessel and it gave rise to a small posterior descending artery and a small  posterolateral branch.  The patient underwent percutaneous transluminal  coronary angioplasty with stent placement in the proximal LAD with reduction  of the 100% occlusion towards 0% residual and the second diagonal branch was  reduced from 95% to 50%.  The patient has been on chronic Coumadin therapy  since that time.  He has not had any chest pain as far as can be  ascertained.   SOCIAL HISTORY:  He is divorced.  He is employed as a Nurse, learning disability.  He  has no children.  He lives with his mother.  He does not smoke.  He uses  alcohol only socially.   ALLERGIES:  None.   FAMILY HISTORY:  Mother is alive at 48 years old with a history of coronary  artery disease.  Father deceased through the complication of diabetes.  He  has one brother with diabetes and he has one brother who is deceased  secondary to myocardial infarction.   EXAMINATION:  GENERAL:  He is a morbidly obese male in no acute distress.  He is lying quietly in bed.  VITAL SIGNS:  Blood pressure is 100/70,  pulse 90, respirations 20,  temperature 98.4, weight is said to be 446 pounds.  HEENT:  Unremarkable.  Neck is supple, there is no JVD, bruit, adenopathy or  thyromegaly.  LUNGS:  Clear to auscultation.  HEART:  Regular rate and rhythm without murmur, gallop or rub.  ABDOMEN:  Obese.  There are five laparoscopic scars in the left upper  quadrant with one in the right upper quadrant.  There is a central incision  in the center portion of the five port sites close to the midline which is  erythematous and indurated with moderate tenderness with draining bloody  purulent material.  EXTREMITIES:  Chronic venous stasis changes more prominent on the right side  than on the left side.  He has good pulses.   IMPRESSION:  1.  Deep subcutaneous wound infection status post laparoscopic adjustable      gastric banding procedure.  2.  Diabetes mellitus.  3.  Atherosclerotic heart disease.  4.  Ischemic cardiomyopathy.  5.  Morbid obesity.  6.  Chronic sleep apnea.  7.  Hypertension by history.  8.  Chronic venous insufficiency.   PLAN:  The left upper abdomen was prepped and draped in a sterile field.  It  was locally infiltrated with Xylocaine and the deep abscess cavity was  opened with exudation of a large volume of purulent material.  This extended  down to the fascia.  The wound was flushed with saline and then packed with  saline soaked 4 x 4's.  Wound cultures were taken.  He has been given Zosyn  and vancomycin.  I will try to get information concerning the place of the  port since the port was not palpable in this deep incision.  This may have  been the site of the insertion of the gastric band and the port may be in  another position.  Hopefully that is the case.  If the port is in the area  that is infected he may have a potentially serious condition due to his  diabetes and the fact that the foreign body that is in place extends into the peritoneal cavity.  The patient is too large  for our CT scanner or MRI  scanner so we will have to judge  his response by labs and by clinical exam.  This was discussed with the patient.  He understands his situation.  We will  proceed with care as noted above.    LCS/MEDQ  D:  07/27/2004  T:  07/27/2004  Job:  [id number]

## 2011-02-26 ENCOUNTER — Other Ambulatory Visit (HOSPITAL_COMMUNITY): Payer: Self-pay | Admitting: Family Medicine

## 2011-02-26 ENCOUNTER — Ambulatory Visit (HOSPITAL_COMMUNITY)
Admission: RE | Admit: 2011-02-26 | Discharge: 2011-02-26 | Disposition: A | Discharge status: Home / Self Care | Payer: PRIVATE HEALTH INSURANCE | Source: Ambulatory Visit | Attending: Family Medicine | Admitting: Family Medicine

## 2011-02-26 DIAGNOSIS — R52 Pain, unspecified: Secondary | ICD-10-CM

## 2011-02-26 DIAGNOSIS — M79609 Pain in unspecified limb: Secondary | ICD-10-CM | POA: Insufficient documentation

## 2011-04-17 ENCOUNTER — Ambulatory Visit: Payer: PRIVATE HEALTH INSURANCE | Admitting: Adult Health

## 2011-04-21 ENCOUNTER — Encounter: Payer: Self-pay | Admitting: Adult Health

## 2011-04-22 ENCOUNTER — Ambulatory Visit (INDEPENDENT_AMBULATORY_CARE_PROVIDER_SITE_OTHER): Payer: PRIVATE HEALTH INSURANCE | Admitting: Adult Health

## 2011-04-22 ENCOUNTER — Encounter: Payer: Self-pay | Admitting: Adult Health

## 2011-04-22 VITALS — BP 126/82 | HR 72 | Resp 18 | Ht 75.0 in | Wt >= 6400 oz

## 2011-04-22 DIAGNOSIS — I2510 Atherosclerotic heart disease of native coronary artery without angina pectoris: Secondary | ICD-10-CM

## 2011-04-22 DIAGNOSIS — I10 Essential (primary) hypertension: Secondary | ICD-10-CM

## 2011-04-22 NOTE — Patient Instructions (Signed)
Your physician recommends that you continue on your current medications as directed. Please refer to the Current Medication list given to you today.  Your physician recommends that you schedule a follow-up appointment in: 3 months  

## 2011-04-22 NOTE — Assessment & Plan Note (Signed)
He is doing well from our standpoint and asymptomatic.  I have encouraged and congratulated him on his wt loss.  He is 10 years post PCI and has had a nuclear study in 2008:" Abnormal stress Myoview study revealing impaired exercise capacity,  substantial left ventricular dilatation with moderate to severe   impairment in left ventricular systolic function in a segmental pattern and a large area of apical, inferior and anteroseptal scar without evidence for ischemia." He is asymptomatic with exercise or activity.  Will plan on repeating echo when he follows up in 3 months for evaluation of EF if possible. He states that his diabetes and hyperlipidemia is well controlled and followed by Dr. [name].

## 2011-04-22 NOTE — Assessment & Plan Note (Signed)
Excellent control on medications. I will need to watch this closely with is weight loss to avoid hypotension. Continue current doses now.

## 2011-04-22 NOTE — Progress Notes (Signed)
HPI: [name] is a very pleasant morbidly obese patient of Dr. Lattie Haw we are following for ongoing assessment and treatment of hypertension, CAD with stent to the proximal LAD and diagonal in 2010, hypercholesterolemia.  He also has a history of sleep apnea and diabetes. He has lost an additional 53 lbs since last being seen in our office on year ago. He continues to exercise daily, diet and is medically compliant. He offers no complaints today.   No Known Allergies  Current Outpatient Prescriptions  Medication Sig Dispense Refill  . aspirin 81 MG tablet Take 81 mg by mouth daily.        . carvedilol (COREG) 25 MG tablet Take 12.5 mg by mouth 2 (two) times daily with a meal.        . Cholecalciferol (VITAMIN D) 2000 UNITS CAPS Take by mouth daily.        Marland Kitchen dexlansoprazole (DEXILANT) 60 MG capsule Take 60 mg by mouth daily.        Marland Kitchen HYDROcodone-acetaminophen (NORCO) 5-325 MG per tablet Take 1 tablet by mouth every 6 (six) hours as needed.        . insulin glargine (LANTUS) 100 UNIT/ML injection Inject into the skin at bedtime.        Marland Kitchen L-Methylfolate-B6-B12 (METANX PO) Take by mouth daily.        Marland Kitchen loratadine (CLARITIN) 10 MG tablet Take 10 mg by mouth daily.        . metoCLOPramide (REGLAN) 10 MG tablet Take 10 mg by mouth 4 (four) times daily.        . Multiple Vitamin (MULTIVITAMIN PO) Take by mouth.        . NON FORMULARY INSULIN-------VICTONZA ---AS DIRECTED       . Potassium Chloride (KLOR-CON PO) Take by mouth daily.        . ramipril (ALTACE) 2.5 MG tablet Take 2.5 mg by mouth daily.        . rosuvastatin (CRESTOR) 10 MG tablet Take 10 mg by mouth daily.        . Tadalafil (CIALIS PO) Take by mouth as needed.        . torsemide (DEMADEX) 20 MG tablet Take 20 mg by mouth 2 (two) times daily.          Past Medical History  Diagnosis Date  . Coronary artery disease   . Sleep apnea   . Hypertension   . Diabetes mellitus   . ASCVD (arteriosclerotic cardiovascular disease)       Past Surgical History  Procedure Date  . Gastric bypass   . Left wrist   . Anal abcess   . Scrotum abcess   . Cardiac catheterization     [id number] of systems complete and found to be negative unless listed above PHYSICAL EXAM BP 126/82  Pulse 72  Resp 18  Ht 6\' 3"  (1.905 m)  Wt 429 lb (194.593 kg)  BMI 53.62 kg/m2  General: Well developed, well nourished,obese  in no acute distress Head: Eyes PERRLA, No xanthomas.   Normal cephalic and atramatic  Lungs: Clear bilaterally to auscultation and percussion. Heart: HRRR S1 S2, without MRG.  Pulses are 2+ & equal.            No carotid bruit. No JVD.  No abdominal bruits. No femoral bruits. Abdomen: Bowel sounds are positive, abdomen soft and non-tender without masses or  Hernia's noted. Msk:  Back normal, slow lumbering  gait. Normal strength and tone for age. Extremities: No clubbing, cyanosis or edema.  DP +1 Neuro: Alert and oriented X 3. Psych:  Good affect, responds appropriately  EKG: NSR with 1st degree AV block, left axis deviation.  Antero/lateral infarct age undetermined. Rate of 71 bpm.  ASSESSMENT AND PLAN

## 2011-04-30 ENCOUNTER — Encounter (HOSPITAL_COMMUNITY): Payer: Self-pay | Admitting: Oncology

## 2011-04-30 ENCOUNTER — Encounter (HOSPITAL_COMMUNITY): Payer: PRIVATE HEALTH INSURANCE | Attending: Oncology | Admitting: Oncology

## 2011-04-30 DIAGNOSIS — E7800 Pure hypercholesterolemia, unspecified: Secondary | ICD-10-CM | POA: Insufficient documentation

## 2011-04-30 DIAGNOSIS — N289 Disorder of kidney and ureter, unspecified: Secondary | ICD-10-CM

## 2011-04-30 DIAGNOSIS — Z79899 Other long term (current) drug therapy: Secondary | ICD-10-CM | POA: Insufficient documentation

## 2011-04-30 DIAGNOSIS — E119 Type 2 diabetes mellitus without complications: Secondary | ICD-10-CM | POA: Insufficient documentation

## 2011-04-30 DIAGNOSIS — R803 Bence Jones proteinuria: Secondary | ICD-10-CM

## 2011-04-30 DIAGNOSIS — I219 Acute myocardial infarction, unspecified: Secondary | ICD-10-CM | POA: Insufficient documentation

## 2011-04-30 DIAGNOSIS — R809 Proteinuria, unspecified: Secondary | ICD-10-CM | POA: Insufficient documentation

## 2011-04-30 DIAGNOSIS — I10 Essential (primary) hypertension: Secondary | ICD-10-CM | POA: Insufficient documentation

## 2011-04-30 DIAGNOSIS — E669 Obesity, unspecified: Secondary | ICD-10-CM | POA: Insufficient documentation

## 2011-04-30 DIAGNOSIS — Z6841 Body Mass Index (BMI) 40.0 and over, adult: Secondary | ICD-10-CM | POA: Insufficient documentation

## 2011-04-30 DIAGNOSIS — Z807 Family history of other malignant neoplasms of lymphoid, hematopoietic and related tissues: Secondary | ICD-10-CM

## 2011-04-30 NOTE — Progress Notes (Signed)
This office note has been dictated.

## 2011-04-30 NOTE — Patient Instructions (Signed)
[name]  ME:[id number] Jun 11, 1962[facility]  Discharge Instructions  RECOMMENDATIONS MADE BY THE CONSULTANT AND ANY TEST RESULTS WILL BE SENT TO YOUR REFERRING DOCTOR.   EXAM FINDINGS BY MD TODAY AND SIGNS AND SYMPTOMS TO REPORT TO CLINIC OR PRIMARY MD: will bring you back tomorrow for labs and your bone survey.  MEDICATIONS PRESCRIBED: none   INSTRUCTIONS GIVEN AND DISCUSSED:   SPECIAL INSTRUCTIONS/FOLLOW-UP: Return to Clinic on :  Once results are available.   I acknowledge that I have been informed and understand all the instructions given to me and received a copy. I do not have any more questions at this time, but understand that I may call the [facility] at [facility] at [phone] during business hours should I have any further questions or need assistance in obtaining follow-up care.    __________________________________________  _____________  __________ Signature of Patient or Authorized Representative            Date                   Time    __________________________________________ Nurse's Signature

## 2011-04-30 NOTE — Progress Notes (Signed)
CC:   [name]. Franklin Addison, MD [name], M.D.  DIAGNOSES: 1. Bence-Jones proteinuria. 2. Massive obesity. 3. Diabetes mellitus. 4. Hypertension. 5. Hypercholesterolemia on Crestor. 6. Erectile dysfunction on Cialis on a p.r.n. basis. 7. History of gastric banding surgery 2005 done in [place], [place]     with minimal success. 8. Renal insufficiency. 9. It is of note in his past medical history he has had history of a     heart attack, taken care of by Dr. Christena Waller. 10.He also has sleep apnea syndrome and uses a CPAP machine at night.  HISTORY:  Very, very pleasant gentleman whom I have known for years because his mother had multiple myeloma and was my patient and unfortunately died of this several years ago from progression of disease.  He comes in today after having a urinalysis and a 24-urine collection which shows over 3 g of total urinary protein and 10% of that was light chains consistent with kappa light chains and found to be monoclonal consistent with Bence-Jones proteinuria. His other labs showed a normal calcium at 9.2, normal serum albumin.  He had a hemoglobin that was normal at 13.4 g, hepatitis B surface antigen screen was negative, hepatitis C virus screen that was negative.  He did not have a total protein in the serum evaluated nor was there serum protein electrophoresis.  He has not been having any bone pain.  He has been trying to lose weight.  He has lost about 21 pounds in the last several months.  He has a followup appointment with Dr. Lowanda Waller, he states, in 3 months.  These labs which were done on 04/07/2011 showed an actual 24-hour urine protein of 3169 mg per 24 hours.  SOCIAL HISTORY:  He has been married twice.  His present wife is with him. He has no children.  FAMILY HISTORY:  He has 1 brother who died of heart disease and stroke in his 50s.  One other brother is still alive and in good health.  His mother, of course, died of multiple  myeloma.  His father had diabetes, was an amputee but committed suicide in the early part of the 2000 decade.  He was in his mid 61s, he states.  REVIEW OF SYSTEMS:  Also positive for right shoulder arthritis which is not new.  His review of systems are otherwise noncontributory.  He has not had any fevers, chills or night sweats.  ALLERGIES:  He has no known allergies.  MEDICATIONS:  Listed in the chart which I reviewed today.  PHYSICAL EXAMINATION:  Vital signs:  6 feet 3 inches tall.  His weight is 430 pounds.  BMI is 53.9.  Blood pressure 131/78, pulse 60 and regular, respirations 18-20 and unlabored at rest.  He is afebrile. HEENT:  He has no pupillary abnormalities.  Throat is clear.  Teeth are in good repair.  Tongue is normal in midline.  He has no adenopathy, no thyromegaly.  Lungs:  Clear to auscultation and percussion.  Heart: Shows a regular rhythm and rate without obvious murmur, rub, or gallop. He is, of course, very obese.  Abdomen:  Hard to evaluate but without obvious masses.  Bowel sounds are present.  Extremities:  He has no arm edema.  He does have chronic lower extremity changes of venous insufficiency and he states he had cellulitis in the right leg once years and years ago.  Dorsalis pedis pulses are 1+ and symmetrical.  I cannot appreciate posterior tibialis pulses.  Both legs  show chronic sort of brawny minimal edema.  Neuro:  He is alert, he is oriented.  He follows all commands.  [name] has Bence-Jones proteinuria but that does not necessarily mean he has myeloma but he clearly needs a workup for that, so we will initiate that with blood work which will be quite extensive tomorrow, I would rather not do it today, and also do a bone survey.  I do not think we could do an MRI of his thoracic and lumbar and cervical spine because his weight but we can check on that if we need to.  We may need to do a bone marrow aspirate and biopsy but I think that remains to be  seen how we can best accomplished that.  If we have to, we could possibly do a sternal aspirate but I would rather have an aspirate and biopsy.  So we may have to recruit Interventional Radiology.  We will see him back in about 3 weeks to go over everything.    ______________________________ [name]. Franklin Stalker, MD ESN/MEDQ  D:  04/30/2011  T:  04/30/2011  Job:  [id number]

## 2011-05-01 ENCOUNTER — Encounter (HOSPITAL_BASED_OUTPATIENT_CLINIC_OR_DEPARTMENT_OTHER): Payer: PRIVATE HEALTH INSURANCE

## 2011-05-01 ENCOUNTER — Ambulatory Visit (HOSPITAL_COMMUNITY)
Admission: RE | Admit: 2011-05-01 | Discharge: 2011-05-01 | Disposition: A | Discharge status: Home / Self Care | Payer: PRIVATE HEALTH INSURANCE | Source: Ambulatory Visit | Attending: Oncology | Admitting: Oncology

## 2011-05-01 DIAGNOSIS — I219 Acute myocardial infarction, unspecified: Secondary | ICD-10-CM

## 2011-05-01 DIAGNOSIS — R809 Proteinuria, unspecified: Secondary | ICD-10-CM

## 2011-05-01 DIAGNOSIS — R803 Bence Jones proteinuria: Secondary | ICD-10-CM

## 2011-05-01 DIAGNOSIS — N289 Disorder of kidney and ureter, unspecified: Secondary | ICD-10-CM

## 2011-05-01 DIAGNOSIS — Z807 Family history of other malignant neoplasms of lymphoid, hematopoietic and related tissues: Secondary | ICD-10-CM

## 2011-05-01 LAB — CBC
HCT: 38.2 % — ABNORMAL LOW (ref 39.0–52.0)
Hemoglobin: 12.7 g/dL — ABNORMAL LOW (ref 13.0–17.0)
MCH: 29.6 pg (ref 26.0–34.0)
MCHC: 33.2 g/dL (ref 30.0–36.0)
MCV: 89 fL (ref 78.0–100.0)
Platelets: 196 10*3/uL (ref 150–400)
RBC: 4.29 MIL/uL (ref 4.22–5.81)
RDW: 13 % (ref 11.5–15.5)
WBC: 6.9 10*3/uL (ref 4.0–10.5)

## 2011-05-01 LAB — DIFFERENTIAL
Basophils Absolute: 0 10*3/uL (ref 0.0–0.1)
Basophils Relative: 0 % (ref 0–1)
Eosinophils Absolute: 0.3 10*3/uL (ref 0.0–0.7)
Eosinophils Relative: 5 % (ref 0–5)
Lymphocytes Relative: 36 % (ref 12–46)
Lymphs Abs: 2.5 10*3/uL (ref 0.7–4.0)
Monocytes Absolute: 0.8 10*3/uL (ref 0.1–1.0)
Monocytes Relative: 11 % (ref 3–12)
Neutro Abs: 3.3 10*3/uL (ref 1.7–7.7)
Neutrophils Relative %: 48 % (ref 43–77)

## 2011-05-01 LAB — COMPREHENSIVE METABOLIC PANEL
ALT: 34 U/L (ref 0–53)
AST: 22 U/L (ref 0–37)
Albumin: 3.4 g/dL — ABNORMAL LOW (ref 3.5–5.2)
Alkaline Phosphatase: 110 U/L (ref 39–117)
BUN: 41 mg/dL — ABNORMAL HIGH (ref 6–23)
CO2: 28 mEq/L (ref 19–32)
Calcium: 9.2 mg/dL (ref 8.4–10.5)
Chloride: 104 mEq/L (ref 96–112)
Creatinine, Ser: 1.45 mg/dL — ABNORMAL HIGH (ref 0.50–1.35)
GFR calc Af Amer: 63 mL/min — ABNORMAL LOW (ref >90)
GFR calc non Af Amer: 54 mL/min — ABNORMAL LOW (ref >90)
Glucose, Bld: 213 mg/dL — ABNORMAL HIGH (ref 70–99)
Potassium: 4.4 mEq/L (ref 3.5–5.1)
Sodium: 139 mEq/L (ref 135–145)
Total Bilirubin: 0.2 mg/dL — ABNORMAL LOW (ref 0.3–1.2)
Total Protein: 6.6 g/dL (ref 6.0–8.3)

## 2011-05-01 NOTE — Progress Notes (Signed)
Labs drawn today for Beta 2Mic,MM panel,KLLC, Cbc/Diff,Cmp

## 2011-05-02 LAB — KAPPA/LAMBDA LIGHT CHAINS
Kappa free light chain: 147 mg/dL — ABNORMAL HIGH (ref 0.33–1.94)
Kappa, lambda light chain ratio: 54.24 — ABNORMAL HIGH (ref 0.26–1.65)
Lambda free light chains: 2.71 mg/dL — ABNORMAL HIGH (ref 0.57–2.63)

## 2011-05-02 LAB — BETA 2 MICROGLOBULIN, SERUM: Beta-2 Microglobulin: 2.93 mg/L — ABNORMAL HIGH (ref 1.01–1.73)

## 2011-05-05 ENCOUNTER — Encounter (HOSPITAL_COMMUNITY): Payer: Self-pay | Admitting: Oncology

## 2011-05-05 ENCOUNTER — Other Ambulatory Visit (HOSPITAL_COMMUNITY): Payer: Self-pay | Admitting: Oncology

## 2011-05-05 DIAGNOSIS — R803 Bence Jones proteinuria: Secondary | ICD-10-CM | POA: Insufficient documentation

## 2011-05-05 HISTORY — DX: Bence Jones proteinuria: R80.3

## 2011-05-05 LAB — MULTIPLE MYELOMA PANEL, SERUM
Albumin ELP: 58.2 % (ref 55.8–66.1)
Alpha-1-Globulin: 5.4 % — ABNORMAL HIGH (ref 2.9–4.9)
Alpha-2-Globulin: 11.5 % (ref 7.1–11.8)
Beta 2: 5.9 % (ref 3.2–6.5)
Beta Globulin: 5.9 % (ref 4.7–7.2)
Gamma Globulin: 13.1 % (ref 11.1–18.8)
IgA: 79 mg/dL (ref 68–379)
IgG (Immunoglobin G), Serum: 843 mg/dL (ref 650–1600)
IgM, Serum: 119 mg/dL (ref 41–251)
M-Spike, %: NOT DETECTED g/dL
Total Protein: 6.4 g/dL (ref 6.0–8.3)

## 2011-05-09 ENCOUNTER — Encounter (HOSPITAL_COMMUNITY): Payer: Self-pay | Admitting: Pharmacy Technician

## 2011-05-12 ENCOUNTER — Other Ambulatory Visit: Payer: Self-pay | Admitting: Physician Assistant

## 2011-05-13 ENCOUNTER — Ambulatory Visit (HOSPITAL_COMMUNITY)
Admission: RE | Admit: 2011-05-13 | Discharge: 2011-05-13 | Disposition: A | Discharge status: Home / Self Care | Payer: PRIVATE HEALTH INSURANCE | Source: Ambulatory Visit | Attending: Oncology | Admitting: Oncology

## 2011-05-13 ENCOUNTER — Encounter (HOSPITAL_COMMUNITY): Payer: Self-pay

## 2011-05-13 DIAGNOSIS — R803 Bence Jones proteinuria: Secondary | ICD-10-CM

## 2011-05-13 DIAGNOSIS — E8809 Other disorders of plasma-protein metabolism, not elsewhere classified: Secondary | ICD-10-CM | POA: Insufficient documentation

## 2011-05-13 DIAGNOSIS — R809 Proteinuria, unspecified: Secondary | ICD-10-CM | POA: Insufficient documentation

## 2011-05-13 DIAGNOSIS — D649 Anemia, unspecified: Secondary | ICD-10-CM | POA: Insufficient documentation

## 2011-05-13 HISTORY — PX: BONE MARROW BIOPSY: SHX199

## 2011-05-13 LAB — CBC
HCT: 35.6 % — ABNORMAL LOW (ref 39.0–52.0)
Hemoglobin: 12.1 g/dL — ABNORMAL LOW (ref 13.0–17.0)
MCH: 29.5 pg (ref 26.0–34.0)
MCHC: 34 g/dL (ref 30.0–36.0)
MCV: 86.8 fL (ref 78.0–100.0)
Platelets: 175 10*3/uL (ref 150–400)
RBC: 4.1 MIL/uL — ABNORMAL LOW (ref 4.22–5.81)
RDW: 12.9 % (ref 11.5–15.5)
WBC: 7.6 10*3/uL (ref 4.0–10.5)

## 2011-05-13 LAB — BONE MARROW EXAM: Bone Marrow Exam: 927

## 2011-05-13 LAB — GLUCOSE, CAPILLARY: Glucose-Capillary: 133 mg/dL — ABNORMAL HIGH (ref 70–99)

## 2011-05-13 LAB — PROTIME-INR
INR: 1.02 (ref 0.00–1.49)
Prothrombin Time: 13.6 seconds (ref 11.6–15.2)

## 2011-05-13 MED ORDER — MIDAZOLAM HCL 5 MG/5ML IJ SOLN
INTRAMUSCULAR | Status: AC | PRN
  Administered 2011-05-13: 2 mg via INTRAVENOUS

## 2011-05-13 MED ORDER — SODIUM CHLORIDE 0.9 % IV SOLN
INTRAVENOUS | Status: DC
  Administered 2011-05-13: 08:00:00 via INTRAVENOUS

## 2011-05-13 MED ORDER — FENTANYL CITRATE 0.05 MG/ML IJ SOLN
INTRAMUSCULAR | Status: AC | PRN
  Administered 2011-05-13: 100 ug via INTRAVENOUS

## 2011-05-13 MED ORDER — HYDROCODONE-ACETAMINOPHEN 5-325 MG PO TABS
1.0000 | ORAL_TABLET | Freq: Four times a day (QID) | ORAL | Status: DC | PRN
  Filled 2011-05-13: qty 2

## 2011-05-13 NOTE — ED Notes (Signed)
Patient denies pain and is resting comfortably.  

## 2011-05-13 NOTE — H&P (View-Only) (Signed)
CC:   [name]. Franklin Addison, MD [name], M.D.  DIAGNOSES: 1. Bence-Jones proteinuria. 2. Massive obesity. 3. Diabetes mellitus. 4. Hypertension. 5. Hypercholesterolemia on Crestor. 6. Erectile dysfunction on Cialis on a p.r.n. basis. 7. History of gastric banding surgery 2005 done in [place], [place]     with minimal success. 8. Renal insufficiency. 9. It is of note in his past medical history he has had history of a     heart attack, taken care of by Dr. Christena Deem. 10.He also has sleep apnea syndrome and uses a CPAP machine at night.  HISTORY:  Very, very pleasant gentleman whom I have known for years because his mother had multiple myeloma and was my patient and unfortunately died of this several years ago from progression of disease.  He comes in today after having a urinalysis and a 24-urine collection which shows over 3 g of total urinary protein and 10% of that was light chains consistent with kappa light chains and found to be monoclonal consistent with Bence-Jones proteinuria. His other labs showed a normal calcium at 9.2, normal serum albumin.  He had a hemoglobin that was normal at 13.4 g, hepatitis B surface antigen screen was negative, hepatitis C virus screen that was negative.  He did not have a total protein in the serum evaluated nor was there serum protein electrophoresis.  He has not been having any bone pain.  He has been trying to lose weight.  He has lost about 21 pounds in the last several months.  He has a followup appointment with Dr. Lowanda Foster, he states, in 3 months.  These labs which were done on 04/07/2011 showed an actual 24-hour urine protein of 3169 mg per 24 hours.  SOCIAL HISTORY:  He has been married twice.  His present wife is with him. He has no children.  FAMILY HISTORY:  He has 1 brother who died of heart disease and stroke in his 13s.  One other brother is still alive and in good health.  His mother, of course, died of multiple  myeloma.  His father had diabetes, was an amputee but committed suicide in the early part of the 2000 decade.  He was in his mid 20s, he states.  REVIEW OF SYSTEMS:  Also positive for right shoulder arthritis which is not new.  His review of systems are otherwise noncontributory.  He has not had any fevers, chills or night sweats.  ALLERGIES:  He has no known allergies.  MEDICATIONS:  Listed in the chart which I reviewed today.  PHYSICAL EXAMINATION:  Vital signs:  6 feet 3 inches tall.  His weight is 430 pounds.  BMI is 53.9.  Blood pressure 131/78, pulse 60 and regular, respirations 18-20 and unlabored at rest.  He is afebrile. HEENT:  He has no pupillary abnormalities.  Throat is clear.  Teeth are in good repair.  Tongue is normal in midline.  He has no adenopathy, no thyromegaly.  Lungs:  Clear to auscultation and percussion.  Heart: Shows a regular rhythm and rate without obvious murmur, rub, or gallop. He is, of course, very obese.  Abdomen:  Hard to evaluate but without obvious masses.  Bowel sounds are present.  Extremities:  He has no arm edema.  He does have chronic lower extremity changes of venous insufficiency and he states he had cellulitis in the right leg once years and years ago.  Dorsalis pedis pulses are 1+ and symmetrical.  I cannot appreciate posterior tibialis pulses.  Both legs  show chronic sort of brawny minimal edema.  Neuro:  He is alert, he is oriented.  He follows all commands.  [name] has Bence-Jones proteinuria but that does not necessarily mean he has myeloma but he clearly needs a workup for that, so we will initiate that with blood work which will be quite extensive tomorrow, I would rather not do it today, and also do a bone survey.  I do not think we could do an MRI of his thoracic and lumbar and cervical spine because his weight but we can check on that if we need to.  We may need to do a bone marrow aspirate and biopsy but I think that remains to be  seen how we can best accomplished that.  If we have to, we could possibly do a sternal aspirate but I would rather have an aspirate and biopsy.  So we may have to recruit Interventional Radiology.  We will see him back in about 3 weeks to go over everything.    ______________________________ [name]. [name], MD ESN/MEDQ  D:  04/30/2011  T:  04/30/2011  Job:  [id number]

## 2011-05-13 NOTE — Procedures (Signed)
CT guided bone marrow aspirate and core biopsy.  No immediate complication.

## 2011-05-13 NOTE — Interval H&P Note (Cosign Needed)
History and Physical Interval Note: History reviewed with patient from 04/30/11 appointment with Dr. [name]. No significant changes since that visit, no recent CP or SOB.  Patient to follow up with Dr. [name] for results in approximately 2-3 weeks.  05/13/2011 8:32 AM  [name]  has presented today for surgery, with the diagnosis of * Bence Jones proteinuria and concern for multiple myeloma. *  The various methods of treatment have been discussed with the patient and family. After consideration of risks, benefits and other options for treatment, the patient has consented to bone marrow needle core biopsy under moderate sedation as a surgical intervention to assist with diagnosis.  The patients' history has been reviewed, patient examined, no change in status, stable for surgery.  I have reviewed the patients' chart and labs.  Questions were answered to the patient's satisfaction.  Written consent obtained.    Ayame Rena D, PA-C

## 2011-05-14 ENCOUNTER — Encounter: Payer: Self-pay | Admitting: Oncology

## 2011-05-18 ENCOUNTER — Encounter (HOSPITAL_COMMUNITY): Payer: Self-pay | Admitting: Emergency Medicine

## 2011-05-18 ENCOUNTER — Emergency Department (HOSPITAL_COMMUNITY)
Admission: EM | Admit: 2011-05-18 | Discharge: 2011-05-18 | Disposition: A | Discharge status: Home / Self Care | Payer: PRIVATE HEALTH INSURANCE | Attending: Emergency Medicine | Admitting: Emergency Medicine

## 2011-05-18 ENCOUNTER — Emergency Department (HOSPITAL_COMMUNITY): Payer: PRIVATE HEALTH INSURANCE

## 2011-05-18 DIAGNOSIS — R935 Abnormal findings on diagnostic imaging of other abdominal regions, including retroperitoneum: Secondary | ICD-10-CM | POA: Insufficient documentation

## 2011-05-18 DIAGNOSIS — I10 Essential (primary) hypertension: Secondary | ICD-10-CM | POA: Insufficient documentation

## 2011-05-18 DIAGNOSIS — I252 Old myocardial infarction: Secondary | ICD-10-CM | POA: Insufficient documentation

## 2011-05-18 DIAGNOSIS — Z9884 Bariatric surgery status: Secondary | ICD-10-CM | POA: Insufficient documentation

## 2011-05-18 DIAGNOSIS — I517 Cardiomegaly: Secondary | ICD-10-CM | POA: Insufficient documentation

## 2011-05-18 DIAGNOSIS — E119 Type 2 diabetes mellitus without complications: Secondary | ICD-10-CM | POA: Insufficient documentation

## 2011-05-18 DIAGNOSIS — Z7982 Long term (current) use of aspirin: Secondary | ICD-10-CM | POA: Insufficient documentation

## 2011-05-18 DIAGNOSIS — J189 Pneumonia, unspecified organism: Secondary | ICD-10-CM

## 2011-05-18 DIAGNOSIS — R1013 Epigastric pain: Secondary | ICD-10-CM | POA: Insufficient documentation

## 2011-05-18 DIAGNOSIS — Z794 Long term (current) use of insulin: Secondary | ICD-10-CM | POA: Insufficient documentation

## 2011-05-18 DIAGNOSIS — I2510 Atherosclerotic heart disease of native coronary artery without angina pectoris: Secondary | ICD-10-CM | POA: Insufficient documentation

## 2011-05-18 LAB — COMPREHENSIVE METABOLIC PANEL
ALT: 16 U/L (ref 0–53)
AST: 16 U/L (ref 0–37)
Albumin: 3.3 g/dL — ABNORMAL LOW (ref 3.5–5.2)
Alkaline Phosphatase: 83 U/L (ref 39–117)
BUN: 33 mg/dL — ABNORMAL HIGH (ref 6–23)
CO2: 27 mEq/L (ref 19–32)
Calcium: 9.3 mg/dL (ref 8.4–10.5)
Chloride: 103 mEq/L (ref 96–112)
Creatinine, Ser: 1.47 mg/dL — ABNORMAL HIGH (ref 0.50–1.35)
GFR calc Af Amer: 62 mL/min — ABNORMAL LOW (ref >90)
GFR calc non Af Amer: 53 mL/min — ABNORMAL LOW (ref >90)
Glucose, Bld: 175 mg/dL — ABNORMAL HIGH (ref 70–99)
Potassium: 4 mEq/L (ref 3.5–5.1)
Sodium: 138 mEq/L (ref 135–145)
Total Bilirubin: 0.5 mg/dL (ref 0.3–1.2)
Total Protein: 6.8 g/dL (ref 6.0–8.3)

## 2011-05-18 LAB — URINALYSIS, ROUTINE W REFLEX MICROSCOPIC
Bilirubin Urine: NEGATIVE
Glucose, UA: NEGATIVE mg/dL
Ketones, ur: NEGATIVE mg/dL
Leukocytes, UA: NEGATIVE
Nitrite: NEGATIVE
Protein, ur: 100 mg/dL — AB
Specific Gravity, Urine: 1.025 (ref 1.005–1.030)
Urobilinogen, UA: 0.2 mg/dL (ref 0.0–1.0)
pH: 6 (ref 5.0–8.0)

## 2011-05-18 LAB — CBC
HCT: 36.5 % — ABNORMAL LOW (ref 39.0–52.0)
Hemoglobin: 12.3 g/dL — ABNORMAL LOW (ref 13.0–17.0)
MCH: 29.9 pg (ref 26.0–34.0)
MCHC: 33.7 g/dL (ref 30.0–36.0)
MCV: 88.6 fL (ref 78.0–100.0)
Platelets: 187 10*3/uL (ref 150–400)
RBC: 4.12 MIL/uL — ABNORMAL LOW (ref 4.22–5.81)
RDW: 12.9 % (ref 11.5–15.5)
WBC: 10.9 10*3/uL — ABNORMAL HIGH (ref 4.0–10.5)

## 2011-05-18 LAB — LIPASE, BLOOD: Lipase: 59 U/L (ref 11–59)

## 2011-05-18 LAB — DIFFERENTIAL
Basophils Absolute: 0 10*3/uL (ref 0.0–0.1)
Basophils Relative: 0 % (ref 0–1)
Eosinophils Absolute: 0.2 10*3/uL (ref 0.0–0.7)
Eosinophils Relative: 2 % (ref 0–5)
Lymphocytes Relative: 21 % (ref 12–46)
Lymphs Abs: 2.3 10*3/uL (ref 0.7–4.0)
Monocytes Absolute: 1.3 10*3/uL — ABNORMAL HIGH (ref 0.1–1.0)
Monocytes Relative: 12 % (ref 3–12)
Neutro Abs: 7.2 10*3/uL (ref 1.7–7.7)
Neutrophils Relative %: 66 % (ref 43–77)

## 2011-05-18 LAB — URINE MICROSCOPIC-ADD ON

## 2011-05-18 MED ORDER — OXYCODONE-ACETAMINOPHEN 5-325 MG PO TABS: 2.0000 | ORAL_TABLET | ORAL | Status: AC | PRN

## 2011-05-18 MED ORDER — AMOXICILLIN 500 MG PO TABS: 1000.0000 mg | ORAL_TABLET | Freq: Two times a day (BID) | ORAL | Status: AC

## 2011-05-18 MED ORDER — SODIUM CHLORIDE 0.9 % IV SOLN
INTRAVENOUS | Status: DC
  Administered 2011-05-18: 500 mL via INTRAVENOUS

## 2011-05-18 MED ORDER — IOHEXOL 300 MG/ML  SOLN
100.0000 mL | Freq: Once | INTRAMUSCULAR | Status: AC | PRN
  Administered 2011-05-18: 100 mL via INTRAVENOUS

## 2011-05-18 MED ORDER — AMOXICILLIN-POT CLAVULANATE 875-125 MG PO TABS
1.0000 | ORAL_TABLET | Freq: Once | ORAL | Status: AC
  Administered 2011-05-18: 1 via ORAL
  Filled 2011-05-18: qty 1

## 2011-05-18 MED ORDER — OXYCODONE-ACETAMINOPHEN 5-325 MG PO TABS
2.0000 | ORAL_TABLET | Freq: Once | ORAL | Status: AC
  Administered 2011-05-18: 2 via ORAL
  Filled 2011-05-18: qty 2

## 2011-05-18 MED ORDER — SODIUM CHLORIDE 0.9 % IV BOLUS (SEPSIS)
500.0000 mL | Freq: Once | INTRAVENOUS | Status: AC
  Administered 2011-05-18: 500 mL via INTRAVENOUS

## 2011-05-18 MED ORDER — AZITHROMYCIN 250 MG PO TABS: ORAL_TABLET | ORAL | Status: AC

## 2011-05-18 MED ORDER — AZITHROMYCIN 250 MG PO TABS
500.0000 mg | ORAL_TABLET | Freq: Once | ORAL | Status: AC
  Administered 2011-05-18: 500 mg via ORAL
  Filled 2011-05-18: qty 2

## 2011-05-18 NOTE — ED Notes (Signed)
Percocet 5-325mg  dispense to patient per EDP's order. Copy of order placed in patient's chart.Dispense prescription shredded.

## 2011-05-18 NOTE — ED Notes (Addendum)
Patient c/o abd pain that radaiates into lower back x3 days. Patient reports having diarrhea on Wednesday but returning to normal BM on Thursday. Denies any nausea or vomiting. Per patient abd pain worse with cough. Denies any chest pain. Patient reports also feeling slightly short of breath x2 days. O2 sat 97% on room air.

## 2011-05-18 NOTE — ED Provider Notes (Signed)
History   This chart was scribed for Babette Relic, MD by Carolyne Littles. The patient was seen in room APA09/APA09 and the patient's care was started at 11:40AM.   CSN: [id number] Arrival date & time: 05/18/2011 10:56 AM   First MD Initiated Contact with Patient 05/18/11 1130      Chief Complaint  Patient presents with  . Abdominal Pain  . Cough    (Consider location/radiation/quality/duration/timing/severity/associated sxs/prior treatment) HPI [name] is a 50 y.o. male who presents to the Emergency Department complaining of constant moderate diffuse abdominal pain which radiates to back onset 5 days ago after multiple episodes of diarrhea and persistent since with associated productive cough with blood tinged sputum. Notes diarrhea resolved following onset of abdominal pain. States last BM was 2 days ago. Patient reports abdominal pain is aggravated with coughing, movement and deep breathing. Denies fever, chills, n/v/d, dysuria, hematuria, hematochezia, numbness, confusion. Patient with h/o of unsuccessful lap-band procedure which was removed following failure. Denies h/o cholecystectomy and appendectomy.   Past Medical History  Diagnosis Date  . Coronary artery disease   . Sleep apnea   . Hypertension   . Diabetes mellitus   . ASCVD (arteriosclerotic cardiovascular disease)   . Heart attack 2000's  . Bence-Jones proteinuria 05/05/2011    Past Surgical History  Procedure Date  . Laparoscopic gastric banding   . Left wrist   . Anal abcess   . Scrotum abcess   . Cardiac catheterization   . Revision gastric restrictive procedure for morbid obesity   . Bone marrow biopsy 05/13/11    from lower back    Family History  Problem Relation Age of Onset  . Heart disease Mother   . Cancer Mother   . Diabetes Father     History  Substance Use Topics  . Smoking status: Former Smoker    Types: Cigarettes, Cigars    Quit date: 05/17/2001  . Smokeless tobacco: Never  Used  . Alcohol Use: No      Review of Systems  Constitutional: Negative for fever and chills.       10 Systems reviewed and are negative for acute change except as noted in the HPI.  HENT: Negative for congestion.   Eyes: Negative for discharge and redness.  Respiratory: Positive for cough. Negative for shortness of breath.   Cardiovascular: Negative for chest pain.  Gastrointestinal: Positive for abdominal pain and diarrhea. Negative for nausea and vomiting.  Musculoskeletal: Negative for back pain.  Skin: Negative for rash.  Neurological: Negative for syncope, numbness and headaches.  Psychiatric/Behavioral:       No behavior change.    Allergies  Review of patient's allergies indicates no known allergies.  Home Medications   Current Outpatient Rx  Name Route Sig Dispense Refill  . ASPIRIN 81 MG PO TABS Oral Take 81 mg by mouth every morning.     Marland Kitchen CARVEDILOL 25 MG PO TABS Oral Take 12.5 mg by mouth 2 (two) times daily with a meal.      . VITAMIN D 2000 UNITS PO CAPS Oral Take 1 capsule by mouth daily.     . DEXLANSOPRAZOLE 60 MG PO CPDR Oral Take 60 mg by mouth every morning.     . INSULIN GLARGINE 100 UNIT/ML Ramirez-Perez SOLN Subcutaneous Inject 20-54 Units into the skin every morning. SLIDING SCALE Under 125 use 20 units Over 125 use 40 units  Over 175 use 54 units    . METANX PO Oral Take 1  tablet by mouth every morning.     Marland Kitchen LIRAGLUTIDE 18 MG/3ML Frank SOLN Subcutaneous Inject 1.2 mg into the skin every evening.     Marland Kitchen LORATADINE 10 MG PO TABS Oral Take 10 mg by mouth every morning.     Marland Kitchen METOCLOPRAMIDE HCL 10 MG PO TABS Oral Take 10 mg by mouth 3 (three) times daily.      Creed Copper M PLUS PO TABS Oral Take 1 tablet by mouth every morning.      Marland Kitchen KLOR-CON PO Oral Take by mouth every morning.     Marland Kitchen RAMIPRIL 2.5 MG PO TABS Oral Take 2.5 mg by mouth every morning.     Marland Kitchen ROSUVASTATIN CALCIUM 10 MG PO TABS Oral Take 10 mg by mouth every morning.     Marland Kitchen CIALIS PO Oral Take by mouth as  needed.      . TORSEMIDE 20 MG PO TABS Oral Take 20 mg by mouth.     . AMOXICILLIN 500 MG PO TABS Oral Take 2 tablets (1,000 mg total) by mouth 2 (two) times daily. 28 tablet 0  . AZITHROMYCIN 250 MG PO TABS  1 daily x next 4 days 4 tablet 0  . OXYCODONE-ACETAMINOPHEN 5-325 MG PO TABS Oral Take 2 tablets by mouth every 4 (four) hours as needed for pain. 6 tablet 0  . OXYCODONE-ACETAMINOPHEN 5-325 MG PO TABS Oral Take 2 tablets by mouth every 4 (four) hours as needed for pain. 20 tablet 0    BP 110/68  Pulse 64  Temp(Src) 98.9 F (37.2 C) (Oral)  Resp 19  Ht 6\' 3"  (1.905 m)  Wt 420 lb 14.4 oz (190.919 kg)  BMI 52.61 kg/m2  SpO2 97%  Physical Exam  Nursing note and vitals reviewed. Constitutional: He is oriented to person, place, and time. He appears well-developed and well-nourished. No distress.       Awake, alert, nontoxic appearance.  HENT:  Head: Normocephalic and atraumatic.  Mouth/Throat: No oropharyngeal exudate.  Eyes: Right eye exhibits no discharge. Left eye exhibits no discharge.  Neck: Neck supple.  Cardiovascular: Normal rate and regular rhythm.   No murmur heard. Pulmonary/Chest: Effort normal and breath sounds normal. He has no wheezes. He has no rales. He exhibits no tenderness.  Abdominal: Soft. Bowel sounds are normal. There is tenderness (mild) in the right upper quadrant and right lower quadrant. There is no rebound and no guarding.  Genitourinary:       Chaperone present for genital exam. Testicles non-tender. No palpable hernia.   Musculoskeletal: Normal range of motion. He exhibits no edema and no tenderness.       Baseline ROM, no obvious new focal weakness. Back non-tender to palpation.   Neurological: He is alert and oriented to person, place, and time.       Mental status and motor strength appears baseline for patient and situation.  Skin: Skin is warm and dry. No rash noted.  Psychiatric: He has a normal mood and affect.    ED Course  Procedures  (including critical care time)  DIAGNOSTIC STUDIES: Oxygen Saturation is 97% on room air, normal by my interpretation.    COORDINATION OF CARE: 11:43AM- Patient refuses pain medications at this time. Explained plan of treatment at this time. Will obtain Ct-abdomen and CXR. Pt stable in ED with no significant deterioration in condition.Patient / Family / Caregiver informed of clinical course, understand medical decision-making process, and agree with plan.  Labs Reviewed  CBC - Abnormal; Notable for  the following:    WBC 10.9 (*)    RBC 4.12 (*)    Hemoglobin 12.3 (*)    HCT 36.5 (*)    All other components within normal limits  DIFFERENTIAL - Abnormal; Notable for the following:    Monocytes Absolute 1.3 (*)    All other components within normal limits  COMPREHENSIVE METABOLIC PANEL - Abnormal; Notable for the following:    Glucose, Bld 175 (*)    BUN 33 (*)    Creatinine, Ser 1.47 (*)    Albumin 3.3 (*)    GFR calc non Af Amer 53 (*)    GFR calc Af Amer 62 (*)    All other components within normal limits  URINALYSIS, ROUTINE W REFLEX MICROSCOPIC - Abnormal; Notable for the following:    Hgb urine dipstick MODERATE (*)    Protein, ur 100 (*)    All other components within normal limits  URINE MICROSCOPIC-ADD ON - Abnormal; Notable for the following:    Squamous Epithelial / LPF FEW (*)    All other components within normal limits  LIPASE, BLOOD   Dg Chest 2 View  05/18/2011  *RADIOLOGY REPORT*  Clinical Data: Cough.  Epigastric pain and right flank pain for 2 days.  Recent diagnosis of cancer.  History of myocardial infarction in 2003.  Stent.  CHEST - 2 VIEW  Comparison: Chest x-ray 03/12/2010  Findings: The heart size is accentuated by shallow lung inflation and patient body habitus.  There is patchy infiltrate in the lateral left lung base, also seen on the prior study.  It is not clear whether this is unresolved or recurrent.  There is patchy, streaky density at the right  lung base, consistent with atelectasis.  No evidence for pulmonary edema.  IMPRESSION:  1.  Cardiomegaly without edema. 2.  Left lower lobe infiltrate.  Follow-up is recommended to document clearing of this process to exclude neoplasm. 3.  Right lower lobe atelectasis.  Original Report Authenticated By: Glenice Bow, M.D.   Ct Abdomen Pelvis W Contrast  05/18/2011  *RADIOLOGY REPORT*  Clinical Data: Morbidly obese.  Right-sided abdominal pain.  Bone marrow biopsy 5 days ago.  CT ABDOMEN AND PELVIS WITH CONTRAST  Technique:  Multidetector CT imaging of the abdomen and pelvis was performed following the standard protocol during bolus administration of intravenous contrast.  Contrast: 138mL OMNIPAQUE IOHEXOL 300 MG/ML IV SOLN  Comparison: CT during bone marrow biopsy 05/13/2011.  CT abdomen pelvis without contrast 04/30/2009.  Findings: Right base atelectasis versus infiltrate with small pleural effusions/pleural reaction.  Appendix well visualized and does not appear inflamed.  The patient does not appear entirely on the CT scanner with small portion of the descending colon not included.  Taking this limitation into account, no extraluminal bowel inflammatory process, free fluid or free air is noted.  Hiatal hernia.  Advanced coronary artery calcifications for the patient's age. Heart size minimally prominent.  Fat containing left sided diaphragmatic hernia.  Fatty infiltration of the liver without focal mass.  No calcified gallstone.  No focal splenic, pancreatic or adrenal lesion.  Mild haziness of fat planes surrounding kidneys unchanged from prior exam suggesting chronic changes without evidence of hydronephrosis or renal mass.  Atherosclerotic type changes aortic bifurcation and common iliac arteries with mild ectasia of the iliac arteries.  Mild prominence inguinal lymph nodes most of which have fatty centers.  Prominence of the right external iliac/common femoral lymph nodes greater on the right  measuring up to 2.3  x 2.1 cm (series 2 image 84) this is without significant change.  This may be inflammatory origin.  Lymphoma not excluded although felt to be less likely consideration given the lack of change.  Minimal haziness along the tract for right side for biopsy.  No obvious complication noted.  IMPRESSION: Right base atelectasis versus infiltrate with small pleural effusions/pleural reaction.  Appendix well visualized and does not appear inflamed.  Hiatal hernia.  Advanced coronary artery calcifications for the patient's age.  Fatty infiltration of the liver without focal mass.  Mild haziness of fat planes surrounding kidneys unchanged from prior exam suggesting chronic changes without evidence of hydronephrosis or renal mass.  Atherosclerotic type changes aortic bifurcation and common iliac arteries with mild ectasia of the iliac arteries.  Mild prominence inguinal lymph nodes most of which have fatty centers.  Prominence of the right external iliac/common femoral lymph nodes greater on the right measuring up to 2.3 x 2.1 cm without significant change.  This may be inflammatory origin. Lymphoma not excluded although felt to be less likely consideration given the lack of change.  Minimal haziness along the tract for right side for biopsy.  No obvious complication noted  Original Report Authenticated By: Doug Sou, M.D.     1. CAP (community acquired pneumonia)   2. Abdominal pain   3. Abnormal abdominal CT scan       MDM  I doubt any other EMC precluding discharge at this time including, but not necessarily limited to the following:sepsis, peritonitis.      I personally performed the services described in this documentation, which was scribed in my presence. The recorded information has been reviewed and considered.    Babette Relic, MD 05/18/11 2108

## 2011-05-20 ENCOUNTER — Ambulatory Visit (HOSPITAL_COMMUNITY): Payer: PRIVATE HEALTH INSURANCE | Admitting: Oncology

## 2011-05-21 LAB — CHROMOSOME ANALYSIS, BONE MARROW

## 2011-05-22 ENCOUNTER — Encounter: Payer: Self-pay | Admitting: Cardiology

## 2011-05-23 ENCOUNTER — Encounter (HOSPITAL_COMMUNITY): Payer: Managed Care, Other (non HMO) | Attending: Oncology | Admitting: Oncology

## 2011-05-23 VITALS — BP 119/76 | HR 79 | Temp 97.9°F

## 2011-05-23 DIAGNOSIS — E119 Type 2 diabetes mellitus without complications: Secondary | ICD-10-CM | POA: Insufficient documentation

## 2011-05-23 DIAGNOSIS — I10 Essential (primary) hypertension: Secondary | ICD-10-CM | POA: Insufficient documentation

## 2011-05-23 DIAGNOSIS — I519 Heart disease, unspecified: Secondary | ICD-10-CM

## 2011-05-23 DIAGNOSIS — E669 Obesity, unspecified: Secondary | ICD-10-CM | POA: Insufficient documentation

## 2011-05-23 DIAGNOSIS — E7800 Pure hypercholesterolemia, unspecified: Secondary | ICD-10-CM | POA: Insufficient documentation

## 2011-05-23 DIAGNOSIS — R809 Proteinuria, unspecified: Secondary | ICD-10-CM | POA: Insufficient documentation

## 2011-05-23 DIAGNOSIS — Z79899 Other long term (current) drug therapy: Secondary | ICD-10-CM | POA: Insufficient documentation

## 2011-05-23 DIAGNOSIS — Z6841 Body Mass Index (BMI) 40.0 and over, adult: Secondary | ICD-10-CM | POA: Insufficient documentation

## 2011-05-23 DIAGNOSIS — C9000 Multiple myeloma not having achieved remission: Secondary | ICD-10-CM

## 2011-05-23 DIAGNOSIS — Z807 Family history of other malignant neoplasms of lymphoid, hematopoietic and related tissues: Secondary | ICD-10-CM

## 2011-05-23 NOTE — Patient Instructions (Signed)
[name]  [id number] 08/06/1960  [facility]  Discharge Instructions  RECOMMENDATIONS MADE BY THE CONSULTANT AND ANY TEST RESULTS WILL BE SENT TO YOUR REFERRING DOCTOR.   EXAM FINDINGS BY MD TODAY AND SIGNS AND SYMPTOMS TO REPORT TO CLINIC OR PRIMARY MD: Dr. [name] wants you to be seen by Dr. [name] at [facility].  We will send your records to them and they will call us with the date and time of your appointment and we will let you know.  Your bone marrow shows probable multiple myeloma.  We will wait to see what Dr. [name] has to offer but will also plan to see you at the end of January.  MEDICATIONS PRESCRIBED: none     SPECIAL INSTRUCTIONS/FOLLOW-UP: Return to Clinic :  End of January to see Dr. [name] and Other (Referral/Appointments) Dr. [name] at [facility].  We will let you know when the appointment will be as soon as we hear from Dr. Marjory Lies office.   I acknowledge that I have been informed and understand all the instructions given to me and received a copy. I do not have any more questions at this time, but understand that I may call the Specialty Clinic at [facility] at [phone] during business hours should I have any further questions or need assistance in obtaining follow-up care.    __________________________________________  _____________  __________ Signature of Patient or Authorized Representative            Date                   Time    __________________________________________ Nurse's Signature

## 2011-05-23 NOTE — Progress Notes (Signed)
CC:   [name]. Franklin Addison, MD [name]. [name], M.D. [name], M.D. Cristopher Estimable. Lattie Haw, MD, [facility]  DIAGNOSIS:  Multiple myeloma kappa light chain disease only with Bence- Jones proteinuria and kappa light chains in the serum, but he has normal IgA, IgG, and IgM levels.  He has barely low hemoglobin to 12.7 g, normal range here 13-17 g.  His bone survey is negative.  His bone marrow biopsy however shows 19% plasma cells that are atypical in appearance.  Cytogenetics were unremarkable, but a myeloma probe is pending which we have just ordered today.  His calcium level is normal. BUN and creatinine are minimally elevated, but that is not necessarily new.  [name] is here today with his wife, and I have spoken with Dr. [name] at [facility].  He feels that we definitely have to call him early multiple myeloma light chain type because of the findings.  I was hoping we could just call him smoldering myeloma, but Dr. [name] is worried about the fact that he may have overt myeloma and may need therapy at some point in the near future.  I do not know if [name] is eligible for protocol because of his history of heart disease, but it may be worth re-evaluating him.  We are going to get a formal second opinion with Dr. [name] because I think [name] is a young man, he is still in good shape except for the obesity and the history of heart disease, etc., but I think he is worse seen in consultation for any other consideration of a protocol and will get that appointment set up with Dr. Marjory Lies secretary.  I will tentatively give [name] an appointment to see me in a month.  We have gone over the ramifications of the diagnosis and the sad part is that his mother of course had myeloma and died from myeloma complications years ago, but we have a lot of new drugs since then and we certainly did not have those when she was alive, so we have a better therapy.  I think we have to just go slowly and carefully with him.  I think  we have time to get a second opinion easily before instituting therapy and I will see him tentatively the end of January.    ______________________________ [name]. [name], MD ESN/MEDQ  D:  05/23/2011  T:  05/23/2011  Job:  [id number]

## 2011-05-23 NOTE — Progress Notes (Signed)
This office note has been dictated.

## 2011-06-26 ENCOUNTER — Encounter (HOSPITAL_COMMUNITY): Payer: Self-pay | Admitting: Dietician

## 2011-06-26 NOTE — Progress Notes (Signed)
[facility] Diabetes Class Completion  Date:June 26, 2011  Time: 6:30 PM  Pt and wife attended [name]'s Diabetes Class on June 26, 2011.   Patient was educated on the following topics: carbohydrate metabolism in relation to diabetes, sources of carbohydrate, carbohydrate counting, meal planning strategies, food label reading, and portion control.   Joaquim Lai, RD, LDN Date:June 26, 2011 Time: 6:30 PM

## 2011-06-30 ENCOUNTER — Encounter (HOSPITAL_COMMUNITY): Payer: Managed Care, Other (non HMO) | Attending: Oncology | Admitting: Oncology

## 2011-06-30 ENCOUNTER — Encounter (HOSPITAL_COMMUNITY): Payer: Self-pay | Admitting: Oncology

## 2011-06-30 ENCOUNTER — Telehealth (HOSPITAL_COMMUNITY): Payer: Self-pay | Admitting: Oncology

## 2011-06-30 DIAGNOSIS — I10 Essential (primary) hypertension: Secondary | ICD-10-CM

## 2011-06-30 DIAGNOSIS — C9000 Multiple myeloma not having achieved remission: Secondary | ICD-10-CM

## 2011-06-30 DIAGNOSIS — E119 Type 2 diabetes mellitus without complications: Secondary | ICD-10-CM

## 2011-06-30 NOTE — Patient Instructions (Addendum)
OBIE SEGALL  [id number] [birth year]   [facility]  Discharge Instructions  RECOMMENDATIONS MADE BY THE CONSULTANT AND ANY TEST RESULTS WILL BE SENT TO YOUR REFERRING DOCTOR.   EXAM FINDINGS BY MD TODAY AND SIGNS AND SYMPTOMS TO REPORT TO CLINIC OR PRIMARY MD: Plans are to treat you using Velcade intravenously, [name] and Decadron in pill form.  Need to get your port a cath placed first.  Would do 4 cycles and then assess how you are doing.  MEDICATIONS PRESCRIBED: none at present     SPECIAL INSTRUCTIONS/FOLLOW-UP: Other (Referral/Appointments) :  To see Dr. Romona Curls on 1/30 at 3 pm for consultation for port placement.  Chemotherapy schedule to be arranged and return to be seen in follow-up in 3 weeks.   I acknowledge that I have been informed and understand all the instructions given to me and received a copy. I do not have any more questions at this time, but understand that I may call the Specialty Clinic at [facility] at [phone] during business hours should I have any further questions or need assistance in obtaining follow-up care.    __________________________________________  _____________  __________ Signature of Patient or Authorized Representative            Date                   Time    __________________________________________ Nurse's Signature

## 2011-06-30 NOTE — Progress Notes (Signed)
This office note has been dictated.

## 2011-06-30 NOTE — Progress Notes (Signed)
PHYSICIAN:  Dr. Lucia Gaskins at [facility] and Dr. [name] in [place].  DIAGNOSIS:  Stage II multiple myeloma with beta 2 microglobulin level less than 3.5, and albumin of 3.4, consisting basically of kappa free light chains.  HISTORY OF PRESENT ILLNESS:  He has no obvious bone disease on his bone survey.  He has still an excellent hemoglobin 12.3 g, white count 10900, platelets 187,000, and differential is unremarkable.  His BUN is 33, creatinine 1.47 but I suspect that is due to his hypertension.  In February 2012, his BUN was 29, creatinine 1.23.  So kappa light chain damage has not been excluded.  His 24-hour urine for kappa light chain excretion showed 147 mg/dL.  His serum IgG level is normal at 843, serum IgA level is normal at 79, and his serum IgM levels low at 119.  An M spike was not detected in the peripheral blood.  But he did have peripheral kappa light chains detected.  Chromosome analysis was done at [facility], and it did not reveal cytogenetic abnormalities.  His bone marrow aspirate and biopsy, however, did reveals as many as 19% plasma cells, showing kappa light chain restriction.  He had abundant storage iron.  He is here today with his wife, having met with Dr. [name] at [facility] who has recommended therapy with the potential should have a good response and should be able to tolerate bone marrow transplant, chemotherapy up front with either Velcade and dexamethasone or [name], Velcade, and dexamethasone.  He of course has massive obesity.  He also has other issues including hypertension and diabetes mellitus. He has also had a history of cardiovascular disease with an MI many years ago.  He has sleep apnea syndrome.  He, however, is a young man 82, and is extremely nice, and a gentleman I have known for many years. So I would like to try to treat him to give him the opportunity, hopefully, with Dr. Marjory Lies help to undergo a definitive bone  marrow transplant.  The sad news is that his mother died of my multiple myeloma several years ago, so this is very difficult for him.  ASSESSMENT AND PLAN:  We are going to try to get things initiated. We will get Dr. Felicie Morn involved for a port placement.  That will be done in the future.  We will then embark on chemotherapy after we get him approved for [name].  I have called Dr. [name], but he is out of the office, about getting him seen for further diabetic control.  And I think we also ought initiate Zometa once a month to make sure he has adequate protection of his bones.  So we are going to pursue that, and we will see him back in the near future.    ______________________________ [name]. [name], MD ESN/MEDQ  D:  06/30/2011  T:  06/30/2011  Job:  [id number]

## 2011-07-01 ENCOUNTER — Other Ambulatory Visit (HOSPITAL_COMMUNITY): Payer: Self-pay | Admitting: Oncology

## 2011-07-01 ENCOUNTER — Telehealth (HOSPITAL_COMMUNITY): Payer: Self-pay | Admitting: Oncology

## 2011-07-01 ENCOUNTER — Encounter (HOSPITAL_BASED_OUTPATIENT_CLINIC_OR_DEPARTMENT_OTHER): Payer: Managed Care, Other (non HMO)

## 2011-07-01 ENCOUNTER — Encounter (HOSPITAL_COMMUNITY): Payer: Self-pay | Admitting: Oncology

## 2011-07-01 DIAGNOSIS — C9000 Multiple myeloma not having achieved remission: Secondary | ICD-10-CM

## 2011-07-01 NOTE — Patient Instructions (Addendum)
[facility]   CHEMOTHERAPY INSTRUCTIONS [name], Velcade  POTENTIAL SIDE EFFECTS OF TREATMENT:  [name] - You will take this daily starting on Fridays with Velcade x 14 days out of every 21 days for a minimum of 4 cycles. This medication will come from a specialty pharmacy called [facility]. This will come in the mail. Side Effects: Risk of fetal harm, neutropenia (low white blood cells), thrombocytopenia (low platelets), blood clots in extremities and/or lungs, itching, rash, dry skin, diarrhea, constipation, nausea, cough, shortness of breath, fatigue, fever, peripheral edema (swelling), muscle aches, dizziness, headache, muscle cramps, upper respiratory tract infections  Velcade - You will take this on Day 1, 4, 8, 11 every 21 days here at the [facility]. This will be an injection in your abdominal tissue.  Side Effects:  peripheral neuropathy,  (numbness, tingling, burning in hands/fingers/feet/toes), hypotension, nausea, vomiting, diarrhea, blurred vision, fatigue, bone marrow suppression (low white blood cells-fight infection, low red blood cells- this makes up your hemoglobin & circulating blood volume, low platelets-this is what helps your blood to clot)  Dexamethasone 4mg  tablet.  - You will be responsible for taking 5 tablets in the am and 5 tablets in the pm with food EVERY Friday!!!!! This is a steroid and will be part of your chemo regimen. However, it is not chemo. This tablet can/will increase your blood sugar. It can also make you feel nervous/jittery/irritable/or make you have trouble sleeping.   Zometa - this medication will be given every 28 days through your IV. This medication will be given to protect your bones from bone loss as well as keep your bones from fracturing.    EDUCATIONAL MATERIALS GIVEN AND REVIEWED: Chemotherapy and You, Care Notes on [name]/Velcade/Dexamethasone/Zometa   SELF CARE ACTIVITIES WHILE ON  CHEMOTHERAPY: Increase your fluid intake 48 hours prior to treatment and drink at least 2 quarts (64 oz of decaff fluids/water daily) for up to 4-5 days after treatment. If you can continue to consume this amount of fluid or more on a daily basis then do so. No alcohol intake., No aspirin or other medications unless approved by your oncologist., Eat foods that are light and easy to digest., Eat foods at cold or room temperature., No fried, fatty, or spicy foods immediately before or after treatment., Have teeth cleaned professionally before starting treatment. Keep dentures and partial plates clean., Use soft toothbrush and do not use mouthwashes that contain alcohol. Biotene is a good mouthwash that is available at most pharmacies or may be ordered by calling [phone]. and Use warm salt water gargles (1 teaspoon salt per 1 quart warm water) before and after meals and at bedtime. Or you may rinse with 2 tablespoons of three -percent hydrogen peroxide mixed in eight ounces of water.  Please wash your hands for at least 30 seconds using warm soapy water when washing hands. Handwashing is the #1 way to prevent the spread of germs. Stay away from sick people or people who are getting over a cold. If you develop respiratory systems such as green/yellow mucus production or productive cough or persistent cough let us know and we will see if you need an antibiotic. It is a good idea to keep a pair of gloves on when going into grocery stores/[employer] to decrease your risk of coming into contact with germs on the carts, etc. Carry alcohol hand gel with you at all times and use it frequently if out in public. All foods need to be  cooked thoroughly. No raw foods. No medium or undercooked meats, eggs. If your food is cooked medium well, it does not need to be hot pink or saturated with bloody liquid at all. Vegetables and fruits need to be washed/rinsed under the faucet with a dish detergent before being consumed. You  can eat raw fruits and vegetables unless we tell you otherwise but it would be best if you cooked them or bought frozen. Do not eat off of salad bars or hot bars unless you really trust the cleanliness of the restaurant. If you need dental work, please let Dr. [name] know before you go for your appointment so that we can coordinate the best possible time for you in regards to your chemo regimen. You need to also let your dentist know that you are actively taking chemo. We may need to do labs prior to your dental appointment. We also want your bowels moving at least every other day. If this is not happening, we need to know so that we can get you on a bowel regimen to help you go.    MEDICATIONS: You have been given prescriptions for the following medications:  Dexamethasone 4mg  tablet. Every Friday, take 5 tablets in the am and 5 tablets in the pm with food. Every Friday until we tell you to stop! Please refer to the calendar that I made you.   [name] 25mg  daily x 14 days out of every 21 days. You will have a 1 week rest period where you don't take any [name]. You will start this on day 1 of your Velcade treatments and proceed to take this for 14 days.  Please refer to the calendar that I made you.  Acyclovir - you will take this daily. You will be given a 1 month supply with refills. So call your pharmacy when you need a refill. This is an anti-viral drug. It helps prevent cold sores/fever blisters.   Bactrim - this is an antibiotic. You will be taking this as a preventative medication - to keep you from developing an infection. You will take this 3 times a week. Monday, Wednesday, Friday. You will have a 1 month supply called in with refills so when you run out - call your pharmacy and let them know that you need a refill.  Compazine 10mg  tablet. May take 1 tablet every 6 hours if needed for nausea/vomiting.  Zofran 8 mg tablet. May take 1 tablet twice a day if needed for nausea/vomiting.  However, we also want you to take this 30 minutes prior to receiving your Velcade.   Ativan 1mg  tablet. May take 1 tablet every 4 hours if needed for nausea/vomiting.  Colace - this is a stool softener. Take 100mg  capsule 2-6 times a day as needed. If you have to take more than 6 capsules of Colace a day call the [facility].  Senna - this is a mild laxative used to treat mild constipation. May take 2 tabs by mouth daily or up to twice a day as needed for mild constipation.  Milk of Magnesia - this is a laxative used to treat moderate to severe constipation. May take 2-4 tablespoons every 8 hours as needed. May increase to 8 tablespoons x 1 dose and if no bowel movement call the [place].  Imodium - this is for diarrhea. Take 2 tabs after 1st loose stool and then 1 tab after each loose stool until you go a total of 12 hours without a loose stool. Call  Cancer Center if loose stools continue.   EMLA Cream Apply quarter size application to Port site 1 hour prior to chemotherapy. Do NOT rub in. Cover area with plastic wrap.     SYMPTOMS TO REPORT AS SOON AS POSSIBLE AFTER TREATMENT:  FEVER GREATER THAN 100.5 F  CHILLS WITH OR WITHOUT FEVER  NAUSEA AND VOMITING THAT IS NOT CONTROLLED WITH YOUR NAUSEA MEDICATION  UNUSUAL SHORTNESS OF BREATH  UNUSUAL BRUISING OR BLEEDING  TENDERNESS IN MOUTH AND THROAT WITH OR WITHOUT PRESENCE OF ULCERS  URINARY PROBLEMS  BOWEL PROBLEMS  UNUSUAL RASH    Wear comfortable clothing and clothing appropriate for easy access to any Portacath or PICC line. Let us know if there is anything that we can do to make your therapy better!      I have been informed and understand all of the instructions given to me and have received a copy. I have been instructed to call the clinic [phone] or my family physician as soon as possible for continued medical care, if indicated. I do not have any more questions at this time but understand that I may  call the [facility] or the Patient Navigator at [phone] during office hours should I have questions or need assistance in obtaining follow-up care.      _________________________________________      _______________     __________ Signature of Patient or Authorized Representative        Date                            Time      _________________________________________ Nurse's Signature

## 2011-07-02 ENCOUNTER — Other Ambulatory Visit (HOSPITAL_COMMUNITY): Payer: Self-pay | Admitting: Oncology

## 2011-07-02 MED ORDER — LENALIDOMIDE 25 MG PO CAPS: 25.0000 mg | ORAL_CAPSULE | Freq: Every day | ORAL | Status: DC

## 2011-07-02 MED ORDER — LORAZEPAM 1 MG PO TABS: 1.0000 mg | ORAL_TABLET | ORAL | Status: DC | PRN

## 2011-07-02 MED ORDER — PROCHLORPERAZINE MALEATE 10 MG PO TABS: 10.0000 mg | ORAL_TABLET | Freq: Four times a day (QID) | ORAL | Status: DC | PRN

## 2011-07-02 MED ORDER — ACYCLOVIR 400 MG PO TABS: 400.0000 mg | ORAL_TABLET | Freq: Every day | ORAL | Status: DC

## 2011-07-02 MED ORDER — DEXAMETHASONE 4 MG PO TABS: ORAL_TABLET | ORAL | Status: DC

## 2011-07-02 MED ORDER — SULFAMETHOXAZOLE-TRIMETHOPRIM 800-160 MG PO TABS: 1.0000 | ORAL_TABLET | ORAL | Status: DC

## 2011-07-02 NOTE — Progress Notes (Signed)
Chemo teaching done and consent signed for [name] poand Velcade sq. Calendar made and 2 copies given to patient/wife. I have retained a copy of his calendar for myself as well as for the nurses in the clinic b/c he has an extensive regimen that requires several drugs other than chemo drugs and patient will more than likely have questions regarding his treatments.

## 2011-07-03 ENCOUNTER — Other Ambulatory Visit (HOSPITAL_COMMUNITY): Payer: Self-pay | Admitting: Family Medicine

## 2011-07-03 ENCOUNTER — Other Ambulatory Visit (HOSPITAL_COMMUNITY): Payer: Self-pay | Admitting: Oncology

## 2011-07-03 ENCOUNTER — Encounter (HOSPITAL_COMMUNITY): Payer: Self-pay | Admitting: Pharmacy Technician

## 2011-07-03 DIAGNOSIS — J189 Pneumonia, unspecified organism: Secondary | ICD-10-CM

## 2011-07-03 MED ORDER — LENALIDOMIDE 25 MG PO CAPS: 25.0000 mg | ORAL_CAPSULE | Freq: Every day | ORAL | Status: DC

## 2011-07-04 ENCOUNTER — Encounter (HOSPITAL_COMMUNITY): Payer: Self-pay

## 2011-07-04 ENCOUNTER — Telehealth (HOSPITAL_COMMUNITY): Payer: Self-pay | Admitting: Oncology

## 2011-07-04 ENCOUNTER — Ambulatory Visit (HOSPITAL_COMMUNITY)
Admission: RE | Admit: 2011-07-04 | Discharge: 2011-07-04 | Disposition: A | Discharge status: Home / Self Care | Payer: PRIVATE HEALTH INSURANCE | Source: Ambulatory Visit | Attending: Family Medicine | Admitting: Family Medicine

## 2011-07-04 ENCOUNTER — Encounter (HOSPITAL_COMMUNITY)
Admission: RE | Admit: 2011-07-04 | Discharge: 2011-07-04 | Disposition: A | Discharge status: Home / Self Care | Payer: PRIVATE HEALTH INSURANCE | Source: Ambulatory Visit | Attending: General Surgery | Admitting: General Surgery

## 2011-07-04 DIAGNOSIS — Z87898 Personal history of other specified conditions: Secondary | ICD-10-CM | POA: Insufficient documentation

## 2011-07-04 DIAGNOSIS — J189 Pneumonia, unspecified organism: Secondary | ICD-10-CM | POA: Insufficient documentation

## 2011-07-04 DIAGNOSIS — J984 Other disorders of lung: Secondary | ICD-10-CM | POA: Insufficient documentation

## 2011-07-04 DIAGNOSIS — Z9283 Personal history of failed moderate sedation: Secondary | ICD-10-CM | POA: Insufficient documentation

## 2011-07-04 HISTORY — DX: Gastro-esophageal reflux disease without esophagitis: K21.9

## 2011-07-04 LAB — CBC
HCT: 35.8 % — ABNORMAL LOW (ref 39.0–52.0)
Hemoglobin: 12 g/dL — ABNORMAL LOW (ref 13.0–17.0)
MCH: 29.9 pg (ref 26.0–34.0)
MCHC: 33.5 g/dL (ref 30.0–36.0)
MCV: 89.1 fL (ref 78.0–100.0)
Platelets: 216 10*3/uL (ref 150–400)
RBC: 4.02 MIL/uL — ABNORMAL LOW (ref 4.22–5.81)
RDW: 13.4 % (ref 11.5–15.5)
WBC: 9.2 10*3/uL (ref 4.0–10.5)

## 2011-07-04 LAB — SURGICAL PCR SCREEN
MRSA, PCR: POSITIVE — AB
Staphylococcus aureus: POSITIVE — AB

## 2011-07-04 LAB — BASIC METABOLIC PANEL
BUN: 33 mg/dL — ABNORMAL HIGH (ref 6–23)
CO2: 26 mEq/L (ref 19–32)
Calcium: 9.1 mg/dL (ref 8.4–10.5)
Chloride: 102 mEq/L (ref 96–112)
Creatinine, Ser: 1.84 mg/dL — ABNORMAL HIGH (ref 0.50–1.35)
GFR calc Af Amer: 47 mL/min — ABNORMAL LOW (ref >90)
GFR calc non Af Amer: 41 mL/min — ABNORMAL LOW (ref >90)
Glucose, Bld: 163 mg/dL — ABNORMAL HIGH (ref 70–99)
Potassium: 4.2 mEq/L (ref 3.5–5.1)
Sodium: 137 mEq/L (ref 135–145)

## 2011-07-04 LAB — DIFFERENTIAL
Basophils Absolute: 0 10*3/uL (ref 0.0–0.1)
Basophils Relative: 0 % (ref 0–1)
Eosinophils Absolute: 0 10*3/uL (ref 0.0–0.7)
Eosinophils Relative: 0 % (ref 0–5)
Lymphocytes Relative: 18 % (ref 12–46)
Lymphs Abs: 1.6 10*3/uL (ref 0.7–4.0)
Monocytes Absolute: 1.1 10*3/uL — ABNORMAL HIGH (ref 0.1–1.0)
Monocytes Relative: 12 % (ref 3–12)
Neutro Abs: 6.4 10*3/uL (ref 1.7–7.7)
Neutrophils Relative %: 70 % (ref 43–77)

## 2011-07-04 NOTE — Patient Instructions (Addendum)
[place]  07/04/2011   Your procedure is scheduled on:  07/07/2011  Report to [name] at [place] AM.  Call this number if you have problems the morning of surgery: [phone]   Remember:   Do not eat food:After Midnight.  May have clear liquids:until Midnight .  Clear liquids include soda, tea, black coffee, apple or grape juice, broth.  Take these medicines the morning of surgery with A SIP OF WATER: Decadron, Zoviax, Coreg, Dexilant,[name], Ativan, Vetoza, Reglan, Zofran, Compazine ,Altace, Loratadine. Take 1/2 dose of ordered Lantus.   Do not wear jewelry, make-up or nail polish.  Do not wear lotions, powders, or perfumes. You may wear deodorant.  Do not shave 48 hours prior to surgery.  Do not bring valuables to the hospital.  Contacts, dentures or bridgework may not be worn into surgery.  Leave suitcase in the car. After surgery it may be brought to your room.  For patients admitted to the hospital, checkout time is 11:00 AM the day of discharge.   Patients discharged the day of surgery will not be allowed to drive home.  Name and phone number of your driver: Family  Special Instructions: CHG Shower Use Special Wash: 1/2 bottle night before surgery and 1/2 bottle morning of surgery.   Please read over the following fact sheets that you were given: Pain Booklet, MRSA Information, Surgical Site Infection Prevention, Anesthesia Post-op Instructions and Care and Recovery After Surgery    Implanted Port Instructions An implanted port is a central line that has a round shape and is placed under the skin. It is used for long-term IV (intravenous) access for:  Medicine.   Fluids.   Liquid nutrition, such as TPN (total parenteral nutrition).   Blood samples.  Ports can be placed:  In the chest area just below the collarbone (this is the most common place.)   In the arms.   In the belly (abdomen) area.   In the legs.  PARTS OF THE PORT A port has 2 main  parts:  The reservoir. The reservoir is round, disc-shaped, and will be a small, raised area under your skin.   The reservoir is the part where a needle is inserted (accessed) to either give medicines or to draw blood.   The catheter. The catheter is a long, slender tube that extends from the reservoir. The catheter is placed into a large vein.   Medicine that is inserted into the reservoir goes into the catheter and then into the vein.  INSERTION OF THE PORT  The port is surgically placed in either an operating room or in a procedural area (interventional radiology).   Medicine may be given to help you relax during the procedure.   The skin where the port will be inserted is numbed (local anesthetic).   1 or 2 small cuts (incisions) will be made in the skin to insert the port.   The port can be used after it has been inserted.  INCISION SITE CARE  The incision site may have small adhesive strips on it. This helps keep the incision site closed. Sometimes, no adhesive strips are placed. Instead of adhesive strips, a special kind of surgical glue is used to keep the incision closed.   If adhesive strips were placed on the incision sites, do not take them off. They will fall off on their own.   The incision site may be sore for 1 to 2 days. Pain medicine can help.   Do  not get the incision site wet. Bathe or shower as directed by your caregiver.   The incision site should heal in 5 to 7 days. A small scar may form after the incision has healed.  ACCESSING THE PORT Special steps must be taken to access the port:  Before the port is accessed, a numbing cream can be placed on the skin. This helps numb the skin over the port site.   A sterile technique is used to access the port.   The port is accessed with a needle. Only "non-coring" port needles should be used to access the port. Once the port is accessed, a blood return should be checked. This helps ensure the port is in the vein  and is not clogged (clotted).   If your caregiver believes your port should remain accessed, a clear (transparent) bandage will be placed over the needle site. The bandage and needle will need to be changed every week or as directed by your caregiver.   Keep the bandage covering the needle clean and dry. Do not get it wet. Follow your caregiver's instructions on how to take a shower or bath when the port is accessed.   If your port does not need to stay accessed, no bandage is needed over the port.  FLUSHING THE PORT Flushing the port keeps it from getting clogged. How often the port is flushed depends on:  If a constant infusion is running. If a constant infusion is running, the port may not need to be flushed.   If intermittent medicines are given.   If the port is not being used.  For intermittent medicines:  The port will need to be flushed:   After medicines have been given.   After blood has been drawn.   As part of routine maintenance.   A port is normally flushed with:   Normal saline.   Heparin.   Follow your caregiver's advice on how often, how much, and the type of flush to use on your port.  IMPORTANT PORT INFORMATION  Tell your caregiver if you are allergic to heparin.   After your port is placed, you will get a manufacturer's information card. The card has information about your port. Keep this card with you at all times.   There are many types of ports available. Know what kind of port you have.   In case of an emergency, it may be helpful to wear a medical alert bracelet. This can help alert health care workers that you have a port.   The port can stay in for as long as your caregiver believes it is necessary.   When it is time for the port to come out, surgery will be done to remove it. The surgery will be similar to how the port was put in.   If you are in the hospital or clinic:   Your port will be taken care of and flushed by a nurse.   If you are  at home:   A home health care nurse may give medicines and take care of the port.   You or a family member can get special training and directions for giving medicine and taking care of the port at home.  SEEK IMMEDIATE MEDICAL CARE IF:   Your port does not flush or you are unable to get a blood return.   New drainage or pus is coming from the incision.   A bad smell is coming from the incision site.  You develop swelling or increased redness at the incision site.   You develop increased swelling or pain at the port site.   You develop swelling or pain in the surrounding skin near the port.   You have an oral temperature above 102 F (38.9 C), not controlled by medicine.  MAKE SURE YOU:   Understand these instructions.   Will watch your condition.   Will get help right away if you are not doing well or get worse.  Document Released: 05/19/2005 Document Revised: 01/29/2011 Document Reviewed: 08/10/2008 [facility] Patient Information 2012 [place].   PATIENT INSTRUCTIONS POST-ANESTHESIA  IMMEDIATELY FOLLOWING SURGERY:  Do not drive or operate machinery for the first twenty four hours after surgery.  Do not make any important decisions for twenty four hours after surgery or while taking narcotic pain medications or sedatives.  If you develop intractable nausea and vomiting or a severe headache please notify your doctor immediately.  FOLLOW-UP:  Please make an appointment with your surgeon as instructed. You do not need to follow up with anesthesia unless specifically instructed to do so.  WOUND CARE INSTRUCTIONS (if applicable):  Keep a dry clean dressing on the anesthesia/puncture wound site if there is drainage.  Once the wound has quit draining you may leave it open to air.  Generally you should leave the bandage intact for twenty four hours unless there is drainage.  If the epidural site drains for more than 36-48 hours please call the anesthesia  department.  QUESTIONS?:  Please feel free to call your physician or the hospital operator if you have any questions, and they will be happy to assist you.     [place] [phone]

## 2011-07-07 ENCOUNTER — Ambulatory Visit (HOSPITAL_COMMUNITY): Payer: PRIVATE HEALTH INSURANCE

## 2011-07-07 ENCOUNTER — Encounter (HOSPITAL_COMMUNITY): Payer: Self-pay | Admitting: Anesthesiology

## 2011-07-07 ENCOUNTER — Ambulatory Visit (HOSPITAL_COMMUNITY): Payer: PRIVATE HEALTH INSURANCE | Admitting: Anesthesiology

## 2011-07-07 ENCOUNTER — Encounter (HOSPITAL_COMMUNITY): Payer: Self-pay | Admitting: *Deleted

## 2011-07-07 ENCOUNTER — Ambulatory Visit (HOSPITAL_COMMUNITY)
Admission: RE | Admit: 2011-07-07 | Discharge: 2011-07-07 | Disposition: A | Discharge status: Home / Self Care | Payer: PRIVATE HEALTH INSURANCE | Source: Ambulatory Visit | Attending: General Surgery | Admitting: General Surgery

## 2011-07-07 ENCOUNTER — Encounter (HOSPITAL_COMMUNITY)
Admission: RE | Disposition: A | Discharge status: Home / Self Care | Payer: Self-pay | Source: Ambulatory Visit | Attending: General Surgery

## 2011-07-07 DIAGNOSIS — I872 Venous insufficiency (chronic) (peripheral): Secondary | ICD-10-CM

## 2011-07-07 DIAGNOSIS — I2510 Atherosclerotic heart disease of native coronary artery without angina pectoris: Secondary | ICD-10-CM

## 2011-07-07 DIAGNOSIS — R803 Bence Jones proteinuria: Secondary | ICD-10-CM

## 2011-07-07 DIAGNOSIS — Z8679 Personal history of other diseases of the circulatory system: Secondary | ICD-10-CM

## 2011-07-07 DIAGNOSIS — M19019 Primary osteoarthritis, unspecified shoulder: Secondary | ICD-10-CM

## 2011-07-07 DIAGNOSIS — G4733 Obstructive sleep apnea (adult) (pediatric): Secondary | ICD-10-CM | POA: Insufficient documentation

## 2011-07-07 DIAGNOSIS — M25819 Other specified joint disorders, unspecified shoulder: Secondary | ICD-10-CM

## 2011-07-07 DIAGNOSIS — I219 Acute myocardial infarction, unspecified: Secondary | ICD-10-CM

## 2011-07-07 DIAGNOSIS — G4730 Sleep apnea, unspecified: Secondary | ICD-10-CM

## 2011-07-07 DIAGNOSIS — C9000 Multiple myeloma not having achieved remission: Secondary | ICD-10-CM | POA: Insufficient documentation

## 2011-07-07 DIAGNOSIS — I10 Essential (primary) hypertension: Secondary | ICD-10-CM | POA: Insufficient documentation

## 2011-07-07 DIAGNOSIS — Z79899 Other long term (current) drug therapy: Secondary | ICD-10-CM | POA: Insufficient documentation

## 2011-07-07 DIAGNOSIS — E119 Type 2 diabetes mellitus without complications: Secondary | ICD-10-CM

## 2011-07-07 HISTORY — PX: PORTACATH PLACEMENT: SHX2246

## 2011-07-07 LAB — GLUCOSE, CAPILLARY: Glucose-Capillary: 153 mg/dL — ABNORMAL HIGH (ref 70–99)

## 2011-07-07 SURGERY — INSERTION, TUNNELED CENTRAL VENOUS DEVICE, WITH PORT
Anesthesia: Monitor Anesthesia Care | Site: Chest | Wound class: Clean

## 2011-07-07 MED ORDER — PROPOFOL 10 MG/ML IV EMUL
INTRAVENOUS | Status: DC | PRN
  Administered 2011-07-07: 50 ug/kg/min via INTRAVENOUS

## 2011-07-07 MED ORDER — STERILE WATER FOR IRRIGATION IR SOLN
Status: DC | PRN
  Administered 2011-07-07: 1000 mL

## 2011-07-07 MED ORDER — CEFAZOLIN SODIUM 1-5 GM-% IV SOLN: 1.0000 g | INTRAVENOUS | Status: DC

## 2011-07-07 MED ORDER — CEFAZOLIN SODIUM-DEXTROSE 2-3 GM-% IV SOLR
INTRAVENOUS | Status: AC
  Filled 2011-07-07: qty 50

## 2011-07-07 MED ORDER — LIDOCAINE HCL (PF) 1 % IJ SOLN
INTRAMUSCULAR | Status: AC
  Filled 2011-07-07: qty 5

## 2011-07-07 MED ORDER — PROPOFOL 10 MG/ML IV EMUL
INTRAVENOUS | Status: AC
  Filled 2011-07-07: qty 20

## 2011-07-07 MED ORDER — 0.9 % SODIUM CHLORIDE (POUR BTL) OPTIME
TOPICAL | Status: DC | PRN
  Administered 2011-07-07: 1000 mL

## 2011-07-07 MED ORDER — PROPOFOL 10 MG/ML IV BOLUS
INTRAVENOUS | Status: DC | PRN
  Administered 2011-07-07: 30 mg via INTRAVENOUS

## 2011-07-07 MED ORDER — MIDAZOLAM HCL 2 MG/2ML IJ SOLN
INTRAMUSCULAR | Status: AC
  Filled 2011-07-07: qty 2

## 2011-07-07 MED ORDER — HEPARIN SOD (PORK) LOCK FLUSH 100 UNIT/ML IV SOLN
INTRAVENOUS | Status: AC
  Filled 2011-07-07: qty 5

## 2011-07-07 MED ORDER — HEPARIN SODIUM (PORCINE) 1000 UNIT/ML IJ SOLN
INTRAMUSCULAR | Status: DC | PRN
  Administered 2011-07-07: 1000 [IU] via INTRAVENOUS

## 2011-07-07 MED ORDER — FENTANYL CITRATE 0.05 MG/ML IJ SOLN
INTRAMUSCULAR | Status: DC | PRN
  Administered 2011-07-07 (×2): 50 ug via INTRAVENOUS

## 2011-07-07 MED ORDER — FENTANYL CITRATE 0.05 MG/ML IJ SOLN
25.0000 ug | INTRAMUSCULAR | Status: DC | PRN
Start: 2011-07-07 — End: 2011-07-07

## 2011-07-07 MED ORDER — MIDAZOLAM HCL 2 MG/2ML IJ SOLN
INTRAMUSCULAR | Status: AC
  Administered 2011-07-07: 2 mg via INTRAVENOUS
  Filled 2011-07-07: qty 2

## 2011-07-07 MED ORDER — FENTANYL CITRATE 0.05 MG/ML IJ SOLN
INTRAMUSCULAR | Status: AC
  Filled 2011-07-07: qty 2

## 2011-07-07 MED ORDER — HEPARIN SOD (PORK) LOCK FLUSH 100 UNIT/ML IV SOLN
INTRAVENOUS | Status: DC | PRN
  Administered 2011-07-07: 500 [IU] via INTRAVENOUS

## 2011-07-07 MED ORDER — LIDOCAINE HCL (PF) 1 % IJ SOLN
INTRAMUSCULAR | Status: AC
  Filled 2011-07-07: qty 30

## 2011-07-07 MED ORDER — CEFAZOLIN SODIUM 1-5 GM-% IV SOLN
INTRAVENOUS | Status: AC
  Filled 2011-07-07: qty 50

## 2011-07-07 MED ORDER — CEFAZOLIN SODIUM 1-5 GM-% IV SOLN
INTRAVENOUS | Status: DC | PRN
  Administered 2011-07-07: 1 g via INTRAVENOUS

## 2011-07-07 MED ORDER — ONDANSETRON HCL 4 MG/2ML IJ SOLN: 4.0000 mg | Freq: Once | INTRAMUSCULAR | Status: DC | PRN

## 2011-07-07 MED ORDER — LACTATED RINGERS IV SOLN
INTRAVENOUS | Status: DC
  Administered 2011-07-07: 1000 mL via INTRAVENOUS

## 2011-07-07 MED ORDER — BACITRACIN-NEOMYCIN-POLYMYXIN 400-5-5000 EX OINT
TOPICAL_OINTMENT | CUTANEOUS | Status: AC
  Filled 2011-07-07: qty 2

## 2011-07-07 MED ORDER — HEPARIN SODIUM (PORCINE) 1000 UNIT/ML IJ SOLN
INTRAMUSCULAR | Status: AC
  Filled 2011-07-07: qty 1

## 2011-07-07 MED ORDER — MIDAZOLAM HCL 2 MG/2ML IJ SOLN
1.0000 mg | INTRAMUSCULAR | Status: DC | PRN
  Administered 2011-07-07 (×2): 2 mg via INTRAVENOUS

## 2011-07-07 MED ORDER — BACITRACIN-NEOMYCIN-POLYMYXIN 400-5-5000 EX OINT
TOPICAL_OINTMENT | CUTANEOUS | Status: DC | PRN
  Administered 2011-07-07: 2 via TOPICAL

## 2011-07-07 MED ORDER — LIDOCAINE HCL (PF) 1 % IJ SOLN
INTRAMUSCULAR | Status: DC | PRN
  Administered 2011-07-07: 15 mL

## 2011-07-07 MED ORDER — LIDOCAINE HCL (PF) 1 % IJ SOLN
INTRAMUSCULAR | Status: AC
  Filled 2011-07-07: qty 2

## 2011-07-07 SURGICAL SUPPLY — 46 items
APPLIER CLIP 9.375 SM OPEN (CLIP)
APR CLP SM 9.3 20 MLT OPN (CLIP)
BAG DECANTER FOR FLEXI CONT (MISCELLANEOUS) ×2 IMPLANT
BAG HAMPER (MISCELLANEOUS) ×2 IMPLANT
CLIP APPLIE 9.375 SM OPEN (CLIP) IMPLANT
CLOTH BEACON ORANGE TIMEOUT ST (SAFETY) ×2 IMPLANT
COVER LIGHT HANDLE STERIS (MISCELLANEOUS) ×6 IMPLANT
DECANTER SPIKE VIAL GLASS SM (MISCELLANEOUS) ×2 IMPLANT
DRAPE C-ARM FOLDED MOBILE STRL (DRAPES) ×2 IMPLANT
DRSG TEGADERM 2-3/8X2-3/4 SM (GAUZE/BANDAGES/DRESSINGS) ×2 IMPLANT
ELECT REM PT RETURN 9FT ADLT (ELECTROSURGICAL) ×2
ELECTRODE REM PT RTRN 9FT ADLT (ELECTROSURGICAL) ×1 IMPLANT
GLOVE ECLIPSE 6.5 STRL STRAW (GLOVE) ×4 IMPLANT
GLOVE EXAM NITRILE MD LF STRL (GLOVE) ×2 IMPLANT
GLOVE INDICATOR 7.0 STRL GRN (GLOVE) ×6 IMPLANT
GLOVE INDICATOR 8.0 STRL GRN (GLOVE) ×2 IMPLANT
GLOVE SKINSENSE NS SZ7.0 (GLOVE) ×1
GLOVE SKINSENSE STRL SZ7.0 (GLOVE) ×1 IMPLANT
GLOVE SS BIOGEL STRL SZ 8 (GLOVE) ×1 IMPLANT
GLOVE SUPERSENSE BIOGEL SZ 8 (GLOVE) ×1
GOWN STRL REIN XL XLG (GOWN DISPOSABLE) ×6 IMPLANT
IV NS 500ML (IV SOLUTION) ×2
IV NS 500ML BAXH (IV SOLUTION) ×1 IMPLANT
KIT PORT POWER 8FR ISP MRI (CATHETERS) ×2 IMPLANT
KIT ROOM TURNOVER APOR (KITS) ×2 IMPLANT
MANIFOLD NEPTUNE II (INSTRUMENTS) ×2 IMPLANT
NDL HYPO 18GX1.5 BLUNT FILL (NEEDLE) ×1 IMPLANT
NEEDLE HYPO 18GX1.5 BLUNT FILL (NEEDLE) ×2 IMPLANT
NEEDLE HYPO 25X1 1.5 SAFETY (NEEDLE) ×2 IMPLANT
PACK MINOR (CUSTOM PROCEDURE TRAY) ×2 IMPLANT
PAD ARMBOARD 7.5X6 YLW CONV (MISCELLANEOUS) ×2 IMPLANT
SET BASIN LINEN APH (SET/KITS/TRAYS/PACK) ×2 IMPLANT
SHEATH COOK PEEL AWAY SET 8F (SHEATH) IMPLANT
SOL PREP PROV IODINE SCRUB 4OZ (MISCELLANEOUS) ×2 IMPLANT
SPONGE GAUZE 2X2 8PLY STRL LF (GAUZE/BANDAGES/DRESSINGS) ×2 IMPLANT
STRIP CLOSURE SKIN 1/4X3 (GAUZE/BANDAGES/DRESSINGS) ×2 IMPLANT
SUT VIC AB 4-0 SH 27 (SUTURE) ×2
SUT VIC AB 4-0 SH 27XBRD (SUTURE) ×1 IMPLANT
SUT VIC AB 5-0 P-3 18X BRD (SUTURE) ×1 IMPLANT
SUT VIC AB 5-0 P3 18 (SUTURE) ×2
SYR 20CC LL (SYRINGE) ×2 IMPLANT
SYR 5ML LL (SYRINGE) ×4 IMPLANT
SYR BULB IRRIGATION 50ML (SYRINGE) ×2 IMPLANT
SYR CONTROL 10ML LL (SYRINGE) ×2 IMPLANT
TOWEL OR 17X26 4PK STRL BLUE (TOWEL DISPOSABLE) ×2 IMPLANT
WATER STERILE IRR 1000ML POUR (IV SOLUTION) ×4 IMPLANT

## 2011-07-07 NOTE — Anesthesia Preprocedure Evaluation (Signed)
Anesthesia Evaluation  Patient identified by MRN, date of birth, ID band Patient awake    Reviewed: Allergy & Precautions, H&P , NPO status , Patient's Chart, lab work & pertinent test results  Airway Mallampati: II TM Distance: >3 FB     Dental  (+) Teeth Intact and Caps   Pulmonary sleep apnea and Continuous Positive Airway Pressure Ventilation ,  clear to auscultation        Cardiovascular hypertension, Pt. on medications + CAD and + Past MI Regular Normal    Neuro/Psych    GI/Hepatic GERD-  Medicated and Controlled,  Endo/Other  Diabetes mellitus-, Well Controlled, Type 2, Oral Hypoglycemic Agents  Renal/GU      Musculoskeletal   Abdominal   Peds  Hematology   Anesthesia Other Findings   Reproductive/Obstetrics                           Anesthesia Physical Anesthesia Plan  ASA: III  Anesthesia Plan: MAC   Post-op Pain Management:    Induction: Intravenous  Airway Management Planned: Simple Face Mask  Additional Equipment:   Intra-op Plan:   Post-operative Plan:   Informed Consent: I have reviewed the patients History and Physical, chart, labs and discussed the procedure including the risks, benefits and alternatives for the proposed anesthesia with the patient or authorized representative who has indicated his/her understanding and acceptance.     Plan Discussed with:   Anesthesia Plan Comments:         Anesthesia Quick Evaluation

## 2011-07-07 NOTE — Progress Notes (Signed)
Post OP Check  Awake and alert.  Wound clean and dry.  CXR shows tip of catheter in SVC and no pneumothorax.  HR 59  BP 129/82  Resp 20  O2 sat 97%.  Discharge and follow up arranged.

## 2011-07-07 NOTE — Anesthesia Postprocedure Evaluation (Signed)
  Anesthesia Post-op Note  Patient: [name]  Procedure(s) Performed:  INSERTION PORT-A-CATH  Patient Location: PACU  Anesthesia Type: MAC  Level of Consciousness: awake, alert  and oriented  Airway and Oxygen Therapy: Patient Spontanous Breathing  Post-op Pain: none  Post-op Assessment: Post-op Vital signs reviewed, Patient's Cardiovascular Status Stable, Respiratory Function Stable and No signs of Nausea or vomiting  Post-op Vital Signs: Reviewed and stable  Complications: No apparent anesthesia complications

## 2011-07-07 NOTE — Op Note (Signed)
NAMEMEMPHIS, SHREINER             ACCOUNT NO.:  [id number]  MEDICAL RECORD NO.:  [id number]  LOCATION:  [facility]                          FACILITY:  [facility]  PHYSICIAN:  Felicie Morn, M.D. DATE OF BIRTH:  29-Jul-1960  DATE OF PROCEDURE:  07/07/2011 DATE OF DISCHARGE:  07/07/2011                              OPERATIVE REPORT   DIAGNOSIS:  Multiple myeloma.  PROCEDURE:  Placement of left subclavian Port-A-Cath under fluoroscopy.  NOTE:  This is a 51 year old black male, morbidly obese, who had the diagnosis of multiple myeloma.  He was referred to [place] for placement of Port-A-Cath for chemotherapy treatment.  We discussed complications, not limited to, but including bleeding, infection, thrombosis, pneumothorax and catheter embolization.  Informed consent was obtained.  GROSS OPERATIVE FINDINGS:  This was placed under the fluoroscopy, and we noted no particular problems with it.  Because of his size, there was some kinking of the dilator; however, we were able to deal with this and thread the silastic catheter over the wire despite the physiognomy of the of the patient.  TECHNIQUE:  The patient was placed in the supine Trendelenburg position and after prepping the left hemithorax with Betadine solution and draping in the usual manner, I anesthetized an area below the left clavicle with 1% Xylocaine without epinephrine.  Then, I used the 18- gauge needle, which we were able to place within the subclavian. Despite the patient's large size, his clavicular landmarks and manubrium were easily palpable.  We were able to thread the guidewire through the needle and then removed the needle and then used the venous dilator and then placed the catheter through the sheath of the venous dilator and threaded this into the superior vena cava under direct fluoroscopy. After being satisfied that this was in good position, I then trimmed the catheter to an appropriate length and then connected it to  the infusion device, which had been previously flushed with heparinized saline solution, and then we aspirated to the make sure that there were no air bubbles, and then had good blood flow, and then we infused heparinized saline through this catheter.  He had another fluoroscopy picture to make sure that the position was acceptable, then we closed the skin with 4-0 Vicryl for the subcutaneous layer and 5-0 Vicryl for the subcuticular layer.  Steri-Strips, Neosporin, 2 x 2, and an OpSite dressing were applied.  Prior to closure, all sponge, needle, and instrument counts were found to be correct.  Estimated blood loss was approximately 50 mL.  The patient received 600 mL of crystalloids intraoperatively.  There were no complications.     Felicie Morn, M.D.     WB/MEDQ  D:  07/07/2011  T:  07/07/2011  Job:  [id number]  cc:   Dr. Henreitta Leber. [name], MD Fax: [phone]

## 2011-07-07 NOTE — Progress Notes (Signed)
11 yr. Old B. Male for insertion of porta cath for chemo for treatment of multiple myeloma.  Filed Vitals:   07/07/11 0715  BP: 115/67  Temp: 98.4 F (36.9 C)  Resp: 30    Labs reviewed and consent obtained.  No clinical changes since H&P.

## 2011-07-07 NOTE — Brief Op Note (Signed)
07/07/2011  9:19 AM  PATIENT:  [name]  51 y.o. male  PRE-OPERATIVE DIAGNOSIS:  multiple myeloma  POST-OPERATIVE DIAGNOSIS:  multiple myeloma  PROCEDURE:  Procedure(s): INSERTION PORT-A-CATH  SURGEON:  Surgeon(s): Scherry Ran, MD  PHYSICIAN ASSISTANT:   ASSISTANTS: none   ANESTHESIA:   IV sedation  EBL:  Total I/O In: 600 [I.V.:600] Out: 25 [Blood:25]  BLOOD ADMINISTERED:none  DRAINS: none   LOCAL MEDICATIONS USED:  LIDOCAINE 10 CC  SPECIMEN:  No Specimen  DISPOSITION OF SPECIMEN:  N/A  COUNTS:  YES  TOURNIQUET:  * No tourniquets in log *  DICTATION: .Other Dictation: Dictation Number Or dict. # [id number]  PLAN OF CARE: Discharge to home after PACU  PATIENT DISPOSITION:  PACU - hemodynamically stable.   Delay start of Pharmacological VTE agent (>24hrs) due to surgical blood loss or risk of bleeding:  {YES/NO/NOT APPLICABLE:20182

## 2011-07-07 NOTE — Transfer of Care (Signed)
Immediate Anesthesia Transfer of Care Note  Patient: [name]  Procedure(s) Performed:  INSERTION PORT-A-CATH  Patient Location: PACU  Anesthesia Type: MAC  Level of Consciousness: awake, alert  and oriented  Airway & Oxygen Therapy: Patient Spontanous Breathing  Post-op Assessment: Report given to PACU RN  Post vital signs: Reviewed and stable  Complications: No apparent anesthesia complications

## 2011-07-08 LAB — GLUCOSE, CAPILLARY: Glucose-Capillary: 144 mg/dL — ABNORMAL HIGH (ref 70–99)

## 2011-07-09 ENCOUNTER — Encounter (HOSPITAL_COMMUNITY): Payer: Self-pay | Admitting: General Surgery

## 2011-07-11 ENCOUNTER — Encounter (HOSPITAL_COMMUNITY): Payer: PRIVATE HEALTH INSURANCE | Attending: Oncology

## 2011-07-11 ENCOUNTER — Inpatient Hospital Stay (HOSPITAL_COMMUNITY): Payer: PRIVATE HEALTH INSURANCE

## 2011-07-11 DIAGNOSIS — C9000 Multiple myeloma not having achieved remission: Secondary | ICD-10-CM

## 2011-07-11 DIAGNOSIS — Z5112 Encounter for antineoplastic immunotherapy: Secondary | ICD-10-CM

## 2011-07-11 LAB — CBC
HCT: 36.1 % — ABNORMAL LOW (ref 39.0–52.0)
Hemoglobin: 12.1 g/dL — ABNORMAL LOW (ref 13.0–17.0)
MCH: 30 pg (ref 26.0–34.0)
MCHC: 33.5 g/dL (ref 30.0–36.0)
MCV: 89.4 fL (ref 78.0–100.0)
Platelets: 252 10*3/uL (ref 150–400)
RBC: 4.04 MIL/uL — ABNORMAL LOW (ref 4.22–5.81)
RDW: 13.1 % (ref 11.5–15.5)
WBC: 7.6 10*3/uL (ref 4.0–10.5)

## 2011-07-11 LAB — DIFFERENTIAL
Basophils Absolute: 0 10*3/uL (ref 0.0–0.1)
Basophils Relative: 0 % (ref 0–1)
Eosinophils Absolute: 0.1 10*3/uL (ref 0.0–0.7)
Eosinophils Relative: 2 % (ref 0–5)
Lymphocytes Relative: 24 % (ref 12–46)
Lymphs Abs: 1.8 10*3/uL (ref 0.7–4.0)
Monocytes Absolute: 0.5 10*3/uL (ref 0.1–1.0)
Monocytes Relative: 6 % (ref 3–12)
Neutro Abs: 5.1 10*3/uL (ref 1.7–7.7)
Neutrophils Relative %: 68 % (ref 43–77)

## 2011-07-11 LAB — COMPREHENSIVE METABOLIC PANEL
ALT: 25 U/L (ref 0–53)
AST: 23 U/L (ref 0–37)
Albumin: 3.6 g/dL (ref 3.5–5.2)
Alkaline Phosphatase: 89 U/L (ref 39–117)
BUN: 35 mg/dL — ABNORMAL HIGH (ref 6–23)
CO2: 27 mEq/L (ref 19–32)
Calcium: 9.7 mg/dL (ref 8.4–10.5)
Chloride: 102 mEq/L (ref 96–112)
Creatinine, Ser: 1.5 mg/dL — ABNORMAL HIGH (ref 0.50–1.35)
GFR calc Af Amer: 60 mL/min — ABNORMAL LOW (ref >90)
GFR calc non Af Amer: 52 mL/min — ABNORMAL LOW (ref >90)
Glucose, Bld: 187 mg/dL — ABNORMAL HIGH (ref 70–99)
Potassium: 4.5 mEq/L (ref 3.5–5.1)
Sodium: 138 mEq/L (ref 135–145)
Total Bilirubin: 0.2 mg/dL — ABNORMAL LOW (ref 0.3–1.2)
Total Protein: 6.7 g/dL (ref 6.0–8.3)

## 2011-07-11 LAB — MULTIPLE MYELOMA PANEL, SERUM

## 2011-07-11 MED ORDER — BORTEZOMIB CHEMO SQ INJECTION 3.5 MG (2.5MG/ML): 3.5000 mg | Freq: Once | INTRAMUSCULAR | Status: DC

## 2011-07-11 MED ORDER — BORTEZOMIB CHEMO SQ INJECTION 3.5 MG (2.5MG/ML): 1.3000 mg/m2 | Freq: Once | INTRAMUSCULAR | Status: DC

## 2011-07-11 MED ORDER — ZOLEDRONIC ACID 4 MG/5ML IV CONC
2.0000 mg | Freq: Once | INTRAVENOUS | Status: AC
  Administered 2011-07-11: 2 mg via INTRAVENOUS
  Filled 2011-07-11: qty 2.5

## 2011-07-11 MED ORDER — HEPARIN SOD (PORK) LOCK FLUSH 100 UNIT/ML IV SOLN
500.0000 [IU] | Freq: Once | INTRAVENOUS | Status: AC | PRN
  Administered 2011-07-11: 500 [IU]
  Filled 2011-07-11: qty 5

## 2011-07-11 MED ORDER — SODIUM CHLORIDE 0.9 % IV SOLN
Freq: Once | INTRAVENOUS | Status: AC
  Administered 2011-07-11: 100 mL via INTRAVENOUS

## 2011-07-11 MED ORDER — BORTEZOMIB CHEMO SQ INJECTION 3.5 MG (2.5MG/ML)
1.3000 mg/m2 | Freq: Once | INTRAMUSCULAR | Status: AC
  Administered 2011-07-11: 3.5 mg via SUBCUTANEOUS
  Filled 2011-07-11: qty 1.4

## 2011-07-11 MED ORDER — SODIUM CHLORIDE 0.9 % IJ SOLN
10.0000 mL | INTRAMUSCULAR | Status: DC | PRN
  Administered 2011-07-11: 10 mL
  Filled 2011-07-11: qty 10

## 2011-07-11 NOTE — Progress Notes (Signed)
Pt took zofran at home at 1345 Tolerated infusion well as well as velcade injection.

## 2011-07-14 ENCOUNTER — Encounter (HOSPITAL_BASED_OUTPATIENT_CLINIC_OR_DEPARTMENT_OTHER): Payer: PRIVATE HEALTH INSURANCE

## 2011-07-14 DIAGNOSIS — C9000 Multiple myeloma not having achieved remission: Secondary | ICD-10-CM

## 2011-07-14 DIAGNOSIS — Z5112 Encounter for antineoplastic immunotherapy: Secondary | ICD-10-CM

## 2011-07-14 LAB — KAPPA/LAMBDA LIGHT CHAINS
Kappa free light chain: 120 mg/dL — ABNORMAL HIGH (ref 0.33–1.94)
Kappa, lambda light chain ratio: 123.71 — ABNORMAL HIGH (ref 0.26–1.65)
Lambda free light chains: 0.97 mg/dL (ref 0.57–2.63)

## 2011-07-14 MED ORDER — BORTEZOMIB CHEMO SQ INJECTION 3.5 MG (2.5MG/ML)
1.3000 mg/m2 | Freq: Once | INTRAMUSCULAR | Status: AC
  Administered 2011-07-14: 3.5 mg via SUBCUTANEOUS
  Filled 2011-07-14: qty 1.4

## 2011-07-14 NOTE — Progress Notes (Signed)
Roda Shutters presents today for injection per MD orders. velcade 3.5 mg administered SQ in right Abdomen. Administration without incident. Patient tolerated well.

## 2011-07-15 ENCOUNTER — Telehealth (HOSPITAL_COMMUNITY): Payer: Self-pay

## 2011-07-15 LAB — MULTIPLE MYELOMA PANEL, SERUM
Albumin ELP: 56.5 % (ref 55.8–66.1)
Alpha-1-Globulin: 5.8 % — ABNORMAL HIGH (ref 2.9–4.9)
Alpha-2-Globulin: 12.1 % — ABNORMAL HIGH (ref 7.1–11.8)
Beta 2: 6 % (ref 3.2–6.5)
Beta Globulin: 6.3 % (ref 4.7–7.2)
Gamma Globulin: 13.3 % (ref 11.1–18.8)
IgA: 80 mg/dL (ref 68–379)
IgG (Immunoglobin G), Serum: 906 mg/dL (ref 650–1600)
IgM, Serum: 100 mg/dL (ref 41–251)
M-Spike, %: NOT DETECTED g/dL
Total Protein: 6.4 g/dL (ref 6.0–8.3)

## 2011-07-15 NOTE — Telephone Encounter (Signed)
Msg. Left on home phone to call clinic with any problems, questions or concerns.

## 2011-07-18 ENCOUNTER — Inpatient Hospital Stay (HOSPITAL_COMMUNITY): Payer: PRIVATE HEALTH INSURANCE

## 2011-07-18 ENCOUNTER — Encounter (HOSPITAL_BASED_OUTPATIENT_CLINIC_OR_DEPARTMENT_OTHER): Payer: PRIVATE HEALTH INSURANCE

## 2011-07-18 DIAGNOSIS — Z5112 Encounter for antineoplastic immunotherapy: Secondary | ICD-10-CM

## 2011-07-18 DIAGNOSIS — C9000 Multiple myeloma not having achieved remission: Secondary | ICD-10-CM

## 2011-07-18 MED ORDER — BORTEZOMIB CHEMO SQ INJECTION 3.5 MG (2.5MG/ML)
1.3000 mg/m2 | Freq: Once | INTRAMUSCULAR | Status: DC
  Filled 2011-07-18: qty 1.4

## 2011-07-18 MED ORDER — ONDANSETRON HCL 4 MG PO TABS
8.0000 mg | ORAL_TABLET | Freq: Once | ORAL | Status: DC
  Filled 2011-07-18: qty 2

## 2011-07-18 MED ORDER — BORTEZOMIB CHEMO SQ INJECTION 3.5 MG (2.5MG/ML)
3.5000 mg | Freq: Once | INTRAMUSCULAR | Status: AC
  Administered 2011-07-18: 3.5 mg via SUBCUTANEOUS
  Filled 2011-07-18: qty 1.4

## 2011-07-18 NOTE — Progress Notes (Signed)
[name] presents today for injection per MD orders. velcade 3.5 mg subcutaneous  in left Abdomen. Administration without incident. Patient tolerated well.  Tolerating chemo with out problems other than fatigue and mild headache.

## 2011-07-21 ENCOUNTER — Encounter (HOSPITAL_BASED_OUTPATIENT_CLINIC_OR_DEPARTMENT_OTHER): Payer: PRIVATE HEALTH INSURANCE | Admitting: Oncology

## 2011-07-21 ENCOUNTER — Ambulatory Visit (HOSPITAL_COMMUNITY): Payer: PRIVATE HEALTH INSURANCE | Admitting: Oncology

## 2011-07-21 ENCOUNTER — Encounter (HOSPITAL_BASED_OUTPATIENT_CLINIC_OR_DEPARTMENT_OTHER): Payer: PRIVATE HEALTH INSURANCE

## 2011-07-21 DIAGNOSIS — C9000 Multiple myeloma not having achieved remission: Secondary | ICD-10-CM

## 2011-07-21 DIAGNOSIS — I10 Essential (primary) hypertension: Secondary | ICD-10-CM

## 2011-07-21 DIAGNOSIS — E119 Type 2 diabetes mellitus without complications: Secondary | ICD-10-CM

## 2011-07-21 LAB — DIFFERENTIAL
Basophils Absolute: 0 10*3/uL (ref 0.0–0.1)
Basophils Relative: 0 % (ref 0–1)
Eosinophils Absolute: 0.4 10*3/uL (ref 0.0–0.7)
Eosinophils Relative: 4 % (ref 0–5)
Lymphocytes Relative: 16 % (ref 12–46)
Lymphs Abs: 1.4 10*3/uL (ref 0.7–4.0)
Monocytes Absolute: 1.2 10*3/uL — ABNORMAL HIGH (ref 0.1–1.0)
Monocytes Relative: 14 % — ABNORMAL HIGH (ref 3–12)
Neutro Abs: 5.7 10*3/uL (ref 1.7–7.7)
Neutrophils Relative %: 66 % (ref 43–77)

## 2011-07-21 LAB — CBC
HCT: 35.5 % — ABNORMAL LOW (ref 39.0–52.0)
Hemoglobin: 12 g/dL — ABNORMAL LOW (ref 13.0–17.0)
MCH: 29.7 pg (ref 26.0–34.0)
MCHC: 33.8 g/dL (ref 30.0–36.0)
MCV: 87.9 fL (ref 78.0–100.0)
Platelets: 188 10*3/uL (ref 150–400)
RBC: 4.04 MIL/uL — ABNORMAL LOW (ref 4.22–5.81)
RDW: 13.2 % (ref 11.5–15.5)
WBC: 8.7 10*3/uL (ref 4.0–10.5)

## 2011-07-21 MED ORDER — BORTEZOMIB CHEMO SQ INJECTION 3.5 MG (2.5MG/ML): 1.3000 mg/m2 | Freq: Once | INTRAMUSCULAR | Status: DC

## 2011-07-21 MED ORDER — BORTEZOMIB CHEMO SQ INJECTION 3.5 MG (2.5MG/ML)
1.3000 mg/m2 | Freq: Once | INTRAMUSCULAR | Status: AC
  Administered 2011-07-21: 3.5 mg via SUBCUTANEOUS
  Filled 2011-07-21: qty 1.4

## 2011-07-21 NOTE — Progress Notes (Signed)
[name] presented for labwork. Labs per MD order drawn via Peripheral Line 23 gauge needle inserted in left antecubital.  Good blood return present. Procedure without incident.  Needle removed intact.[name] presents today for injection per MD orders. Velcade 3.5 mg administered SQ in right Abdomen. Administration without incident. Patient tolerated well.

## 2011-07-21 NOTE — Patient Instructions (Signed)
[facility]  Discharge Instructions  RECOMMENDATIONS MADE BY THE CONSULTANT AND ANY TEST RESULTS WILL BE SENT TO YOUR REFERRING DOCTOR.   EXAM FINDINGS BY MD TODAY AND SIGNS AND SYMPTOMS TO REPORT TO CLINIC OR PRIMARY MD:  Exam today per [name].   INSTRUCTIONS GIVEN AND DISCUSSED: Notify us of any fever, chills, cough or congestion.   SPECIAL INSTRUCTIONS/FOLLOW-UP: Return to Clinic in 3 weeks.   I acknowledge that I have been informed and understand all the instructions given to me and received a copy. I do not have any more questions at this time, but understand that I may call the Specialty Clinic at [facility] at [phone] during business hours should I have any further questions or need assistance in obtaining follow-up care.    __________________________________________  _____________  __________ Signature of Patient or Authorized Representative            Date                   Time    __________________________________________ Nurse's Signature

## 2011-07-21 NOTE — Progress Notes (Signed)
[name], MD, MD [place] [id number]  1. Multiple myeloma  CBC, Differential, Comprehensive metabolic panel, Multiple myeloma panel, serum, Kappa/lambda light chains    CURRENT THERAPY:S/P 1 cycle of Bortezomib SQ days 1, 4, 8, 11 and Dexamethasone every 21 days.  INTERVAL HISTORY: [name] 51 y.o. male returns for  regular  visit for followup of  Stage II multiple myeloma with beta 2 microglobulin level less than 3.5, and albumin of 3.4, consisting basically of kappa free light chains.  The patient denies any complaints.  He hands me two herbal supplements he is taking and asks if these are "okay" to take while on therapy.  They are both made by [employer] and one is a nutritional shake mix and the other is a complex multivitamin.  I will provide these to Dr. [name] for his approval.  Otherwise, the patient is doing well.  He denies any complaints.  He does report some head congestion, but denies any nasal discharge or a productive cough.  He associates his symptoms with the change in weather.  He is taking a daily antihistamine.   .ROS: No TIA's or unusual headaches, no dysphagia.  No prolonged cough. No dyspnea or chest pain on exertion.  No abdominal pain, change in bowel habits, black or bloody stools.  No urinary tract symptoms.  No new or unusual musculoskeletal symptoms.    He is due for Day 11 of his Velcade injection to complete his 1 cycle of therapy.   Past Medical History  Diagnosis Date  . Coronary artery disease   . Sleep apnea   . Hypertension   . ASCVD (arteriosclerotic cardiovascular disease)   . Heart attack 2000's  . 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 Multiple myeloma 07/01/2011  . Diabetes mellitus   . GERD (gastroesophageal reflux disease)     has DIABETES MELLITUS, TYPE II, ON INSULIN; MORBID OBESITY; HYPERTENSION; CAD; VENOUS INSUFFICIENCY; ARTHRITIS, RIGHT SHOULDER; IMPINGEMENT SYNDROME; SLEEP APNEA; ATHEROSCLEROTIC  CARDIOVASCULAR DISEASE, HX OF; Heart attack; Bence-Jones proteinuria; and Multiple myeloma on his problem list.      has no known allergies.  Mr. Rohrbaugh had no medications administered during this visit.  Past Surgical History  Procedure Date  . Laparoscopic gastric banding     lap ban  . Left wrist     removal of bone fragment  . Anal abcess   . Scrotum abcess   . Cardiac catheterization   . Revision gastric restrictive procedure for morbid obesity   . Bone marrow biopsy 05/13/11    from lower back  . Bone marrow biopsy   . Portacath placement 07/07/2011    Procedure: INSERTION PORT-A-CATH;  Surgeon: Scherry Ran, MD;  Location: [facility];  Service: General;  Laterality: N/A;    Denies any headaches, dizziness, double vision, fevers, chills, night sweats, nausea, vomiting, diarrhea, constipation, chest pain, heart palpitations, shortness of breath, blood in stool, black tarry stool, urinary pain, urinary burning, urinary frequency, hematuria.   PHYSICAL EXAMINATION  ECOG PERFORMANCE STATUS: 1 - Symptomatic but completely ambulatory  There were no vitals filed for this visit.  GENERAL:alert, no distress, well nourished, well developed, comfortable, cooperative and obese SKIN: skin color, texture, turgor are normal, no rashes or significant lesions HEAD: Normocephalic, No masses, lesions, tenderness or abnormalities EYES: normal EARS: External ears normal OROPHARYNX:mucous membranes are moist  NECK: supple, trachea midline LYMPH:  no palpable lymphadenopathy BREAST:not examined LUNGS: clear to auscultation and percussion, decreased breath sounds  due to body habitus HEART: regular rate & rhythm, no murmurs, no gallops, S1 normal, S2 normal and distant heart sounds due to body habitus ABDOMEN:abdomen soft, non-tender, obese and normal bowel sounds BACK: Back symmetric, no curvature., No CVA tenderness EXTREMITIES:less then 2 second capillary refill, no joint deformities,  effusion, or inflammation, no skin discoloration, no clubbing, no cyanosis  NEURO: alert & oriented x 3 with fluent speech, no focal motor/sensory deficits, gait normal   LABORATORY DATA: CBC    Component Value Date/Time   WBC 7.6 07/11/2011 1408   RBC 4.04* 07/11/2011 1408   HGB 12.1* 07/11/2011 1408   HCT 36.1* 07/11/2011 1408   PLT 252 07/11/2011 1408   MCV 89.4 07/11/2011 1408   MCH 30.0 07/11/2011 1408   MCHC 33.5 07/11/2011 1408   RDW 13.1 07/11/2011 1408   LYMPHSABS 1.8 07/11/2011 1408   MONOABS 0.5 07/11/2011 1408   EOSABS 0.1 07/11/2011 1408   BASOSABS 0.0 07/11/2011 1408      Chemistry      Component Value Date/Time   NA 138 07/11/2011 1408   K 4.5 07/11/2011 1408   CL 102 07/11/2011 1408   CO2 27 07/11/2011 1408   BUN 35* 07/11/2011 1408   CREATININE 1.50* 07/11/2011 1408      Component Value Date/Time   CALCIUM 9.7 07/11/2011 1408   ALKPHOS 89 07/11/2011 1408   AST 23 07/11/2011 1408   ALT 25 07/11/2011 1408   BILITOT 0.2* 07/11/2011 1408        ASSESSMENT:  1. Stage II multiple myeloma with beta 2 microglobulin level  less than 3.5, and albumin of 3.4, consisting basically of kappa free light chains. 2. Morbid obesity 3. HTN 4. DM 5. CAD 6. Venous insufficiency 7. Sleep apnea    PLAN:  1. Lab work day of therapy: CBC diff 2. Lab work every 21 days: CBC diff, CMET, MM panel 3. I personally reviewed and went over laboratory results with the patient. 4. Will get his day 11 Velcade injection today to complete cycle 1 of therapy.  5. Dr. [name] has agreed that the patient can take one of his supplements daily, either the multivitamin or the nutritional shake daily. 6. Return in 3 weeks for follow-up.   All questions were answered. The patient knows to call the clinic with any problems, questions or concerns. We can certainly see the patient much sooner if necessary.  The patient and plan discussed with Everardo All, MD and he is in agreement with the  aforementioned.   Jonelle Bann

## 2011-07-28 ENCOUNTER — Encounter: Payer: Self-pay | Admitting: Cardiology

## 2011-07-28 ENCOUNTER — Ambulatory Visit (INDEPENDENT_AMBULATORY_CARE_PROVIDER_SITE_OTHER): Payer: PRIVATE HEALTH INSURANCE | Admitting: Cardiology

## 2011-07-28 DIAGNOSIS — C9000 Multiple myeloma not having achieved remission: Secondary | ICD-10-CM

## 2011-07-28 DIAGNOSIS — I10 Essential (primary) hypertension: Secondary | ICD-10-CM | POA: Insufficient documentation

## 2011-07-28 DIAGNOSIS — I2510 Atherosclerotic heart disease of native coronary artery without angina pectoris: Secondary | ICD-10-CM | POA: Insufficient documentation

## 2011-07-28 DIAGNOSIS — E785 Hyperlipidemia, unspecified: Secondary | ICD-10-CM

## 2011-07-28 DIAGNOSIS — G4730 Sleep apnea, unspecified: Secondary | ICD-10-CM

## 2011-07-28 DIAGNOSIS — E119 Type 2 diabetes mellitus without complications: Secondary | ICD-10-CM

## 2011-07-28 NOTE — Assessment & Plan Note (Addendum)
Patient has had no further problems with coronary artery disease for the past decade.  We will continue to attempt to achieve optimal control of cardiovascular risk factors.  Stress nuclear study in 2008 suggested substantial impairment of left ventricular systolic function, but other studies have indicated a normal ejection fraction.  An echocardiogram will be obtained to reassess LV function.

## 2011-07-28 NOTE — Assessment & Plan Note (Addendum)
Diabetic control has been worse since treatment with glucocorticoids initiated.  I suggest allowing somewhat less than optimal control during treatment for multiple myeloma and subsequently aiming for a hemoglobin A1c level of 7.

## 2011-07-28 NOTE — Assessment & Plan Note (Signed)
Lipid profiles within the past year have been reasonable.  Current medication will be continued.

## 2011-07-28 NOTE — Progress Notes (Signed)
Patient ID: [name], male   DOB: 1960/09/22, 51 y.o.   MRN: [id number] HPI: Scheduled return visit for this very nice gentleman with a remote history of coronary artery disease and continuing cardiovascular risk factors, most notably hypertension.  Since his last visit, and in fact since PCI approximately 10 years ago, he has done extremely well from a cardiac standpoint.  He experiences no dyspnea nor chest discomfort and maintains his usual level of activity without difficulty.  Unfortunately, he was recently diagnosed with multiple myeloma and is currently undergoing chemotherapy for that disease.  He was apparently diagnosed as a result of abnormal renal function or an abnormal urinalysis, and has not resulted in symptoms or significant anemia.  Patient has recently had symptoms of upper respiratory infection with mild pharyngitis and cough productive of mucoid sputum.  Prior to Admission medications   Medication Sig Start Date End Date Taking? Authorizing Provider  acyclovir (ZOVIRAX) 400 MG tablet Take 400 mg by mouth daily. To start Jul 07, 2011 (to take daily while on chemo)   Yes Historical Provider, MD  aspirin EC 325 MG tablet Take 325 mg by mouth daily.   Yes Historical Provider, MD  bortezomib IV (VELCADE) 3.5 MG injection Inject 1.3 mg/m2 into the vein every 21 ( twenty-one) days. To start Jul 11, 2011   Yes Historical Provider, MD  carvedilol (COREG) 25 MG tablet Take 12.5 mg by mouth 2 (two) times daily with a meal.    Yes Historical Provider, MD  Cholecalciferol (VITAMIN D) 2000 UNITS CAPS Take 1 capsule by mouth daily.    Yes Historical Provider, MD  dexamethasone (DECADRON) 4 MG tablet Take 5 decadron tablets BID every Friday! Start Friday Feb 8th. 07/12/11  Yes Pieter Partridge, MD  dexlansoprazole (DEXILANT) 60 MG capsule Take 60 mg by mouth every morning.    Yes Historical Provider, MD  insulin glargine (LANTUS) 100 UNIT/ML injection Inject 20-54 Units into the skin every  morning. SLIDING SCALE Under 125 use 20 units Over 125 use 40 units  Over 175 use 54 units   Yes Historical Provider, MD  L-Methylfolate-B6-B12 (METANX PO) Take 1 tablet by mouth every morning.    Yes Historical Provider, MD  lenalidomide ([name]) 25 MG capsule Take 1 capsule (25 mg total) by mouth daily. To start Jul 11, 2011 and take daily x 14 days then to have a 7 day rest period where he doesn't take any [name]. To repeat this every 21 days. 07/03/11  Yes Robynn Pane, PA  lidocaine-prilocaine (EMLA) cream Apply 1 application topically as needed. To port site. Apply 1 hour prior to treatment. Do not rub in. Cover with plastic.   Yes Historical Provider, MD  Liraglutide (VICTOZA) 18 MG/3ML SOLN Inject 1.2 mg into the skin every evening.    Yes Historical Provider, MD  loratadine (CLARITIN) 10 MG tablet Take 10 mg by mouth every morning.    Yes Historical Provider, MD  LORazepam (ATIVAN) 1 MG tablet Take 1 mg by mouth every 4 (four) hours as needed. Nausea/vomiting   Yes Historical Provider, MD  metoCLOPramide (REGLAN) 10 MG tablet Take 10 mg by mouth 4 (four) times daily.    Yes Historical Provider, MD  Multiple Vitamins-Minerals (MULTIVITAMINS THER. W/MINERALS) TABS Take 1 tablet by mouth every morning.     Yes Historical Provider, MD  ondansetron (ZOFRAN) 8 MG tablet Take 8 mg by mouth 2 (two) times daily as needed. Nausea/vomiting. Patient to also take this 30 minutes to  1 hour prior to Velcade injections.   Yes Historical Provider, MD  potassium chloride (K-DUR,KLOR-CON) 10 MEQ tablet Take 10 mEq by mouth daily.   Yes Historical Provider, MD  prochlorperazine (COMPAZINE) 10 MG tablet Take 10 mg by mouth every 6 (six) hours as needed. For nausea/vomiting   Yes Historical Provider, MD  ramipril (ALTACE) 2.5 MG tablet Take 2.5 mg by mouth every morning.    Yes Historical Provider, MD  rosuvastatin (CRESTOR) 10 MG tablet Take 10 mg by mouth every morning.    Yes Historical Provider, MD    sulfamethoxazole-trimethoprim (BACTRIM DS,SEPTRA DS) 800-160 MG per tablet Take 1 tablet by mouth 3 (three) times a week. To start Jul 07, 2011 (to take MWF while on chemo)   Yes Historical Provider, MD  Tadalafil (CIALIS PO) Take by mouth as needed.     Yes Historical Provider, MD  torsemide (DEMADEX) 20 MG tablet Take 40 mg by mouth daily.    Yes Historical Provider, MD  Zoledronic Acid (ZOMETA IV) Inject 2 mg into the vein every 28 (twenty-eight) days. To start Jul 11, 2011   Yes Historical Provider, MD    No Known Allergies    Past medical history, social history, and family history reviewed and updated.  ROS: Denies orthopnea, PND, palpitations, lightheadedness, syncope or significant pedal edema.  PHYSICAL EXAM: BP 134/78  Pulse 60  Resp 18  Ht 6\' 3"  (1.905 m)  Wt 189.15 kg (417 lb)  BMI 52.12 kg/m2 .  Weight loss of 12 pounds since his previous visit General-Well developed; no acute distress Body habitus-proportionate weight and height Neck-No JVD; no carotid bruits Lungs-clear lung fields Except for minimal ventilatory rhonchi; resonant to percussion Cardiovascular-normal PMI; Distant S1 and S2 Abdomen-normal bowel sounds; soft and non-tender without masses or organomegaly Musculoskeletal-No deformities, no cyanosis or clubbing Neurologic-Normal cranial nerves; symmetric strength and tone Skin-Warm, no significant lesions Extremities-distal pulses intact; modest edema; compression stockings in place  ASSESSMENT AND PLAN:  Jacqulyn Ducking, MD 07/28/2011 12:29 PM

## 2011-07-28 NOTE — Assessment & Plan Note (Signed)
Patient is congratulated on a drop in weight loss and encouraged to continue to be as active as possible and to limit caloric intake.

## 2011-07-28 NOTE — Patient Instructions (Signed)
Your physician recommends that you schedule a follow-up appointment in: 1 year  Your physician has requested that you have an echocardiogram. Echocardiography is a painless test that uses sound waves to create images of your heart. It provides your doctor with information about the size and shape of your heart and how well your heart's chambers and valves are working. This procedure takes approximately one hour. There are no restrictions for this procedure.

## 2011-07-28 NOTE — Assessment & Plan Note (Signed)
Blood pressure control has been excellent with current medications, which will be continued.

## 2011-07-29 ENCOUNTER — Ambulatory Visit (HOSPITAL_COMMUNITY)
Admission: RE | Admit: 2011-07-29 | Discharge: 2011-07-29 | Disposition: A | Discharge status: Home / Self Care | Payer: PRIVATE HEALTH INSURANCE | Source: Ambulatory Visit | Attending: Cardiology | Admitting: Cardiology

## 2011-07-29 DIAGNOSIS — E785 Hyperlipidemia, unspecified: Secondary | ICD-10-CM | POA: Insufficient documentation

## 2011-07-29 DIAGNOSIS — I517 Cardiomegaly: Secondary | ICD-10-CM

## 2011-07-29 DIAGNOSIS — I2510 Atherosclerotic heart disease of native coronary artery without angina pectoris: Secondary | ICD-10-CM | POA: Insufficient documentation

## 2011-07-29 DIAGNOSIS — I10 Essential (primary) hypertension: Secondary | ICD-10-CM | POA: Insufficient documentation

## 2011-07-29 DIAGNOSIS — E119 Type 2 diabetes mellitus without complications: Secondary | ICD-10-CM | POA: Insufficient documentation

## 2011-07-29 NOTE — Progress Notes (Signed)
*  PRELIMINARY RESULTS* Echocardiogram 2D Echocardiogram has been performed.  [name] 07/29/2011, 9:43 AM

## 2011-07-31 ENCOUNTER — Telehealth (HOSPITAL_COMMUNITY): Payer: Self-pay | Admitting: Oncology

## 2011-07-31 ENCOUNTER — Telehealth: Payer: Self-pay | Admitting: *Deleted

## 2011-07-31 ENCOUNTER — Other Ambulatory Visit (HOSPITAL_COMMUNITY): Payer: Self-pay | Admitting: Oncology

## 2011-07-31 MED ORDER — LENALIDOMIDE 25 MG PO CAPS: ORAL_CAPSULE | ORAL | Status: DC

## 2011-08-01 ENCOUNTER — Encounter (HOSPITAL_COMMUNITY): Payer: PRIVATE HEALTH INSURANCE | Attending: Oncology

## 2011-08-01 ENCOUNTER — Other Ambulatory Visit (HOSPITAL_COMMUNITY): Payer: Self-pay | Admitting: Oncology

## 2011-08-01 DIAGNOSIS — Z5112 Encounter for antineoplastic immunotherapy: Secondary | ICD-10-CM

## 2011-08-01 DIAGNOSIS — L089 Local infection of the skin and subcutaneous tissue, unspecified: Secondary | ICD-10-CM | POA: Insufficient documentation

## 2011-08-01 DIAGNOSIS — J019 Acute sinusitis, unspecified: Secondary | ICD-10-CM | POA: Insufficient documentation

## 2011-08-01 DIAGNOSIS — C9000 Multiple myeloma not having achieved remission: Secondary | ICD-10-CM | POA: Insufficient documentation

## 2011-08-01 LAB — DIFFERENTIAL
Basophils Absolute: 0 10*3/uL (ref 0.0–0.1)
Basophils Relative: 0 % (ref 0–1)
Eosinophils Absolute: 0.1 10*3/uL (ref 0.0–0.7)
Eosinophils Relative: 1 % (ref 0–5)
Lymphocytes Relative: 13 % (ref 12–46)
Lymphs Abs: 0.8 10*3/uL (ref 0.7–4.0)
Monocytes Absolute: 0.2 10*3/uL (ref 0.1–1.0)
Monocytes Relative: 4 % (ref 3–12)
Neutro Abs: 5.5 10*3/uL (ref 1.7–7.7)
Neutrophils Relative %: 83 % — ABNORMAL HIGH (ref 43–77)

## 2011-08-01 LAB — COMPREHENSIVE METABOLIC PANEL
ALT: 26 U/L (ref 0–53)
AST: 22 U/L (ref 0–37)
Albumin: 3.6 g/dL (ref 3.5–5.2)
Alkaline Phosphatase: 82 U/L (ref 39–117)
BUN: 34 mg/dL — ABNORMAL HIGH (ref 6–23)
CO2: 25 mEq/L (ref 19–32)
Calcium: 9 mg/dL (ref 8.4–10.5)
Chloride: 104 mEq/L (ref 96–112)
Creatinine, Ser: 1.47 mg/dL — ABNORMAL HIGH (ref 0.50–1.35)
GFR calc Af Amer: 62 mL/min — ABNORMAL LOW (ref >90)
GFR calc non Af Amer: 53 mL/min — ABNORMAL LOW (ref >90)
Glucose, Bld: 215 mg/dL — ABNORMAL HIGH (ref 70–99)
Potassium: 4.4 mEq/L (ref 3.5–5.1)
Sodium: 140 mEq/L (ref 135–145)
Total Bilirubin: 0.4 mg/dL (ref 0.3–1.2)
Total Protein: 6.7 g/dL (ref 6.0–8.3)

## 2011-08-01 LAB — CBC
HCT: 36.3 % — ABNORMAL LOW (ref 39.0–52.0)
Hemoglobin: 12.3 g/dL — ABNORMAL LOW (ref 13.0–17.0)
MCH: 30 pg (ref 26.0–34.0)
MCHC: 33.9 g/dL (ref 30.0–36.0)
MCV: 88.5 fL (ref 78.0–100.0)
Platelets: 152 10*3/uL (ref 150–400)
RBC: 4.1 MIL/uL — ABNORMAL LOW (ref 4.22–5.81)
RDW: 13.3 % (ref 11.5–15.5)
WBC: 6.7 10*3/uL (ref 4.0–10.5)

## 2011-08-01 MED ORDER — LENALIDOMIDE 25 MG PO CAPS: ORAL_CAPSULE | ORAL | Status: DC

## 2011-08-01 MED ORDER — BORTEZOMIB CHEMO SQ INJECTION 3.5 MG (2.5MG/ML): 1.3000 mg/m2 | Freq: Once | INTRAMUSCULAR | Status: DC

## 2011-08-01 MED ORDER — BORTEZOMIB CHEMO SQ INJECTION 3.5 MG (2.5MG/ML)
3.5000 mg | Freq: Once | INTRAMUSCULAR | Status: AC
  Administered 2011-08-01: 3.5 mg via SUBCUTANEOUS
  Filled 2011-08-01: qty 1.4

## 2011-08-01 NOTE — Progress Notes (Signed)
[name] presents today for injection per MD orders. velcade 3.5 mg administered SQ in left Abdomen. Administration without incident. Patient tolerated well. Pt has cold and is on z-pack per his PCP. Doing well otherwise.

## 2011-08-02 ENCOUNTER — Encounter: Payer: Self-pay | Admitting: Cardiology

## 2011-08-04 ENCOUNTER — Encounter: Payer: Self-pay | Admitting: *Deleted

## 2011-08-04 ENCOUNTER — Encounter (HOSPITAL_BASED_OUTPATIENT_CLINIC_OR_DEPARTMENT_OTHER): Payer: PRIVATE HEALTH INSURANCE

## 2011-08-04 ENCOUNTER — Other Ambulatory Visit (HOSPITAL_COMMUNITY): Payer: Self-pay | Admitting: Oncology

## 2011-08-04 DIAGNOSIS — C9000 Multiple myeloma not having achieved remission: Secondary | ICD-10-CM

## 2011-08-04 DIAGNOSIS — Z5112 Encounter for antineoplastic immunotherapy: Secondary | ICD-10-CM

## 2011-08-04 LAB — KAPPA/LAMBDA LIGHT CHAINS
Kappa free light chain: 9.04 mg/dL — ABNORMAL HIGH (ref 0.33–1.94)
Kappa, lambda light chain ratio: 2.71 — ABNORMAL HIGH (ref 0.26–1.65)
Lambda free light chains: 3.34 mg/dL — ABNORMAL HIGH (ref 0.57–2.63)

## 2011-08-04 MED ORDER — BORTEZOMIB CHEMO SQ INJECTION 3.5 MG (2.5MG/ML): 1.3000 mg/m2 | Freq: Once | INTRAMUSCULAR | Status: DC

## 2011-08-04 MED ORDER — BORTEZOMIB CHEMO SQ INJECTION 3.5 MG (2.5MG/ML)
3.5000 mg | Freq: Once | INTRAMUSCULAR | Status: AC
  Administered 2011-08-04: 3.5 mg via SUBCUTANEOUS
  Filled 2011-08-04: qty 1.4

## 2011-08-04 MED ORDER — BORTEZOMIB CHEMO SQ INJECTION 3.5 MG (2.5MG/ML): 3.5000 mg | Freq: Once | INTRAMUSCULAR | Status: DC

## 2011-08-04 MED ORDER — ONDANSETRON HCL 8 MG PO TABS
8.0000 mg | ORAL_TABLET | Freq: Once | ORAL | Status: DC
  Filled 2011-08-04: qty 1

## 2011-08-05 LAB — MULTIPLE MYELOMA PANEL, SERUM
Albumin ELP: 56.6 % (ref 55.8–66.1)
Alpha-1-Globulin: 6.4 % — ABNORMAL HIGH (ref 2.9–4.9)
Alpha-2-Globulin: 13.2 % — ABNORMAL HIGH (ref 7.1–11.8)
Beta 2: 5.6 % (ref 3.2–6.5)
Beta Globulin: 6.2 % (ref 4.7–7.2)
Gamma Globulin: 12 % (ref 11.1–18.8)
IgA: 102 mg/dL (ref 68–379)
IgG (Immunoglobin G), Serum: 898 mg/dL (ref 650–1600)
IgM, Serum: 133 mg/dL (ref 41–251)
M-Spike, %: NOT DETECTED g/dL
Total Protein: 6.5 g/dL (ref 6.0–8.3)

## 2011-08-07 ENCOUNTER — Other Ambulatory Visit (HOSPITAL_COMMUNITY): Payer: Self-pay | Admitting: Oncology

## 2011-08-08 ENCOUNTER — Encounter (HOSPITAL_BASED_OUTPATIENT_CLINIC_OR_DEPARTMENT_OTHER): Payer: PRIVATE HEALTH INSURANCE

## 2011-08-08 ENCOUNTER — Other Ambulatory Visit (HOSPITAL_COMMUNITY): Payer: Self-pay | Admitting: Oncology

## 2011-08-08 ENCOUNTER — Ambulatory Visit (HOSPITAL_COMMUNITY)
Admission: RE | Admit: 2011-08-08 | Discharge: 2011-08-08 | Disposition: A | Discharge status: Home / Self Care | Payer: PRIVATE HEALTH INSURANCE | Source: Ambulatory Visit | Attending: Family Medicine | Admitting: Family Medicine

## 2011-08-08 ENCOUNTER — Other Ambulatory Visit (HOSPITAL_COMMUNITY): Payer: Self-pay | Admitting: Family Medicine

## 2011-08-08 ENCOUNTER — Telehealth (HOSPITAL_COMMUNITY): Payer: Self-pay | Admitting: *Deleted

## 2011-08-08 DIAGNOSIS — R059 Cough, unspecified: Secondary | ICD-10-CM | POA: Insufficient documentation

## 2011-08-08 DIAGNOSIS — J9819 Other pulmonary collapse: Secondary | ICD-10-CM | POA: Insufficient documentation

## 2011-08-08 DIAGNOSIS — Z5112 Encounter for antineoplastic immunotherapy: Secondary | ICD-10-CM

## 2011-08-08 DIAGNOSIS — C9000 Multiple myeloma not having achieved remission: Secondary | ICD-10-CM

## 2011-08-08 DIAGNOSIS — R05 Cough: Secondary | ICD-10-CM

## 2011-08-08 MED ORDER — HEPARIN SOD (PORK) LOCK FLUSH 100 UNIT/ML IV SOLN
INTRAVENOUS | Status: AC
  Administered 2011-08-08: 500 [IU]
  Filled 2011-08-08: qty 5

## 2011-08-08 MED ORDER — BORTEZOMIB CHEMO SQ INJECTION 3.5 MG (2.5MG/ML)
1.3000 mg/m2 | Freq: Once | INTRAMUSCULAR | Status: AC
  Administered 2011-08-08: 3.5 mg via SUBCUTANEOUS
  Filled 2011-08-08: qty 1.4

## 2011-08-08 MED ORDER — HEPARIN SOD (PORK) LOCK FLUSH 100 UNIT/ML IV SOLN
500.0000 [IU] | Freq: Once | INTRAVENOUS | Status: AC | PRN
  Administered 2011-08-08: 500 [IU]
  Filled 2011-08-08: qty 5

## 2011-08-08 MED ORDER — SODIUM CHLORIDE 0.9 % IJ SOLN
10.0000 mL | INTRAMUSCULAR | Status: DC | PRN
  Administered 2011-08-08: 10 mL
  Filled 2011-08-08: qty 10

## 2011-08-08 MED ORDER — SODIUM CHLORIDE 0.9 % IV SOLN
Freq: Once | INTRAVENOUS | Status: AC
  Administered 2011-08-08: 500 mL via INTRAVENOUS

## 2011-08-08 MED ORDER — ZOLEDRONIC ACID 4 MG/5ML IV CONC
2.0000 mg | Freq: Once | INTRAVENOUS | Status: AC
  Administered 2011-08-08: 2 mg via INTRAVENOUS
  Filled 2011-08-08: qty 2.5

## 2011-08-08 NOTE — Telephone Encounter (Signed)
Pt has velcade scheduled on 3/29 which is a holiday. Can we give velcade on 3/28?

## 2011-08-08 NOTE — Progress Notes (Addendum)
[name] reports taking zofran 8mg  @ 1345 today; took dexamethasone x 5 tabs this am as well.  Pt tolerated infusion well and without incident; verbalizes understanding for follow-up.  No distress noted at time of discharge and patient was discharged home with himself.

## 2011-08-11 ENCOUNTER — Encounter (HOSPITAL_BASED_OUTPATIENT_CLINIC_OR_DEPARTMENT_OTHER): Payer: PRIVATE HEALTH INSURANCE | Admitting: Oncology

## 2011-08-11 ENCOUNTER — Ambulatory Visit (HOSPITAL_COMMUNITY): Payer: PRIVATE HEALTH INSURANCE | Admitting: Oncology

## 2011-08-11 ENCOUNTER — Encounter (HOSPITAL_BASED_OUTPATIENT_CLINIC_OR_DEPARTMENT_OTHER): Payer: PRIVATE HEALTH INSURANCE

## 2011-08-11 ENCOUNTER — Encounter (HOSPITAL_COMMUNITY): Payer: Self-pay | Admitting: Oncology

## 2011-08-11 ENCOUNTER — Telehealth (HOSPITAL_COMMUNITY): Payer: Self-pay | Admitting: Oncology

## 2011-08-11 DIAGNOSIS — C9000 Multiple myeloma not having achieved remission: Secondary | ICD-10-CM

## 2011-08-11 DIAGNOSIS — J0100 Acute maxillary sinusitis, unspecified: Secondary | ICD-10-CM

## 2011-08-11 DIAGNOSIS — J019 Acute sinusitis, unspecified: Secondary | ICD-10-CM

## 2011-08-11 DIAGNOSIS — J0110 Acute frontal sinusitis, unspecified: Secondary | ICD-10-CM

## 2011-08-11 DIAGNOSIS — Z5112 Encounter for antineoplastic immunotherapy: Secondary | ICD-10-CM

## 2011-08-11 MED ORDER — FLUTICASONE PROPIONATE 50 MCG/ACT NA SUSP: NASAL | Status: DC

## 2011-08-11 MED ORDER — BORTEZOMIB CHEMO SQ INJECTION 3.5 MG (2.5MG/ML)
1.3000 mg/m2 | Freq: Once | INTRAMUSCULAR | Status: AC
  Administered 2011-08-11: 3.5 mg via SUBCUTANEOUS
  Filled 2011-08-11: qty 1.4

## 2011-08-11 MED ORDER — ONDANSETRON HCL 4 MG PO TABS
8.0000 mg | ORAL_TABLET | Freq: Once | ORAL | Status: AC
  Administered 2011-08-11: 8 mg via ORAL
  Filled 2011-08-11: qty 2

## 2011-08-11 MED ORDER — AMOXICILLIN-POT CLAVULANATE 875-125 MG PO TABS: 1.0000 | ORAL_TABLET | Freq: Two times a day (BID) | ORAL | Status: AC

## 2011-08-11 MED ORDER — FLUTICASONE PROPIONATE 50 MCG/ACT NA SUSP
2.0000 | Freq: Every day | NASAL | Status: DC
  Filled 2011-08-11: qty 16

## 2011-08-11 NOTE — Patient Instructions (Signed)
[name]  [id number] 1960/06/26   [facility]  Discharge Instructions  RECOMMENDATIONS MADE BY THE CONSULTANT AND ANY TEST RESULTS WILL BE SENT TO YOUR REFERRING DOCTOR.   EXAM FINDINGS BY MD TODAY AND SIGNS AND SYMPTOMS TO REPORT TO CLINIC OR PRIMARY MD: your myeloma is responding well.  Stop the reglan for now.  Once your diarrhea stops you can restart it.  MEDICATIONS PRESCRIBED: Augmentin & Flonase Follow label directions  INSTRUCTIONS GIVEN AND DISCUSSED: Other :  Report fevers, chills, uncontrolled nausea or vomiting.  SPECIAL INSTRUCTIONS/FOLLOW-UP: Return to Clinic as scheduled    I acknowledge that I have been informed and understand all the instructions given to me and received a copy. I do not have any more questions at this time, but understand that I may call the Specialty Clinic at [facility] at [phone] during business hours should I have any further questions or need assistance in obtaining follow-up care.    __________________________________________  _____________  __________ Signature of Patient or Authorized Representative            Date                   Time    __________________________________________ Nurse's Signature

## 2011-08-11 NOTE — Progress Notes (Signed)
CC:   [name]. [name], MD [name]. [name], M.D.  DIAGNOSES: 1. Stage II multiple myeloma with a beta 2 microglobulin level less     than 3.5, and albumin 3.4, and he has kappa free light chain     disease. 2. Acute sinusitis.  [name] had 1 cycle of Velcade and dexamethasone.  The dexamethasone is weekly.  He is now into his second cycle and he is tolerating therapy well but he has developed acute sinusitis.  He developed a cold like illness 2-3 weeks ago, was treated with a Z-Pak by his family physician, did not really get much better.  He has been coughing and sputtering ever since.  No fever, no chills but he developed face pain yesterday over his frontal sinuses and maxillary sinuses.  His counts from last week were still really quite good and of course he does not have an M spike.  He also has intact IgG, IgA and IgM levels.  On the first though what was nice is that his kappa free light chains have come from 120 down to 9 mg per dL.  His kappa to lambda ratio has also dropped from 123 to 2.71.  His creatinine was 1.7 the other day. He states that he is hydrating himself very well, 64 ounces every day, and his creatinine is actually down from 1.50 on 02/08.  His CBC was also excellent the other day with a hemoglobin 12.3 g, white count 6700, platelets 152,000 and plenty of neutrophils.  PHYSICAL EXAMINATION:  His lungs are clear today.  His heart shows a regular rhythm and rate.  He does not have distinct facial tenderness but he states every time he coughs he has this facial pain.  He is afebrile.  His neck is supple without obvious nodes.  His heart shows a regular rhythm and rate as I mentioned.  Lungs are clear.  He has no peripheral edema.  He is wearing his stockings.  I am going to put him on Augmentin 875 b.i.d. for 8 days, Flonase nasal spray 2 sprays each nostril, once a day for 2 weeks.  He is going to continue on over-the-counter decongestant on a p.r.n. basis, and  we are going to continue with therapy.  I will see him while he is here for therapy within the next few days.    ______________________________ [name]. [name], MD ESN/MEDQ  D:  08/11/2011  T:  08/11/2011  Job:  [id number]

## 2011-08-11 NOTE — Progress Notes (Signed)
This office note has been dictated.

## 2011-08-11 NOTE — Progress Notes (Signed)
Tolerated velcade subcutaneously well.

## 2011-08-14 ENCOUNTER — Other Ambulatory Visit (HOSPITAL_COMMUNITY): Payer: Self-pay | Admitting: Oncology

## 2011-08-14 MED ORDER — FIRST-DUKES MOUTHWASH MT SUSP: 5.0000 mL | Freq: Four times a day (QID) | OROMUCOSAL | Status: DC | PRN

## 2011-08-15 ENCOUNTER — Other Ambulatory Visit (HOSPITAL_COMMUNITY): Payer: Self-pay

## 2011-08-15 ENCOUNTER — Telehealth (HOSPITAL_COMMUNITY): Payer: Self-pay

## 2011-08-15 DIAGNOSIS — R9389 Abnormal findings on diagnostic imaging of other specified body structures: Secondary | ICD-10-CM

## 2011-08-15 NOTE — Telephone Encounter (Signed)
Patient notified to come for repeat chest xray on Monday 3/18.  Verbalizes understanding.

## 2011-08-18 ENCOUNTER — Ambulatory Visit (HOSPITAL_COMMUNITY)
Admission: RE | Admit: 2011-08-18 | Discharge: 2011-08-18 | Disposition: A | Discharge status: Home / Self Care | Payer: PRIVATE HEALTH INSURANCE | Source: Ambulatory Visit | Attending: Oncology | Admitting: Oncology

## 2011-08-18 DIAGNOSIS — R9389 Abnormal findings on diagnostic imaging of other specified body structures: Secondary | ICD-10-CM

## 2011-08-18 DIAGNOSIS — R918 Other nonspecific abnormal finding of lung field: Secondary | ICD-10-CM | POA: Insufficient documentation

## 2011-08-18 DIAGNOSIS — C9000 Multiple myeloma not having achieved remission: Secondary | ICD-10-CM | POA: Insufficient documentation

## 2011-08-19 ENCOUNTER — Telehealth (HOSPITAL_COMMUNITY): Payer: Self-pay | Admitting: Oncology

## 2011-08-19 ENCOUNTER — Encounter: Payer: Self-pay | Admitting: Oncology

## 2011-08-20 ENCOUNTER — Telehealth (HOSPITAL_COMMUNITY): Payer: Self-pay

## 2011-08-20 NOTE — Telephone Encounter (Signed)
He does not have an appointment with anyone and Dr. [name] is not aware of this as far a the patient knows because he has only seen him once.

## 2011-08-20 NOTE — Telephone Encounter (Signed)
Message copied by Phylliss Bob on Wed Aug 20, 2011  2:37 PM ------      Message from: Baird Cancer III      Created: Wed Aug 20, 2011 12:44 PM       Please call patient.  He reports that his insurance does not participate with [facility] for a potential bone marrow transplant.  He told me that [facility] does.  Does he have an appointment to see someone at [facility] for this?  If not, is Dr. [name] going to set this up?  If not we will set up the referral.

## 2011-08-21 ENCOUNTER — Other Ambulatory Visit (HOSPITAL_COMMUNITY): Payer: Self-pay | Admitting: Oncology

## 2011-08-22 ENCOUNTER — Encounter (HOSPITAL_BASED_OUTPATIENT_CLINIC_OR_DEPARTMENT_OTHER): Payer: PRIVATE HEALTH INSURANCE

## 2011-08-22 ENCOUNTER — Other Ambulatory Visit (HOSPITAL_COMMUNITY): Payer: Self-pay | Admitting: Oncology

## 2011-08-22 DIAGNOSIS — Z5112 Encounter for antineoplastic immunotherapy: Secondary | ICD-10-CM

## 2011-08-22 DIAGNOSIS — C9000 Multiple myeloma not having achieved remission: Secondary | ICD-10-CM

## 2011-08-22 LAB — CBC
HCT: 34.3 % — ABNORMAL LOW (ref 39.0–52.0)
Hemoglobin: 11.6 g/dL — ABNORMAL LOW (ref 13.0–17.0)
MCH: 30 pg (ref 26.0–34.0)
MCHC: 33.8 g/dL (ref 30.0–36.0)
MCV: 88.6 fL (ref 78.0–100.0)
Platelets: 145 10*3/uL — ABNORMAL LOW (ref 150–400)
RBC: 3.87 MIL/uL — ABNORMAL LOW (ref 4.22–5.81)
RDW: 14 % (ref 11.5–15.5)
WBC: 7.2 10*3/uL (ref 4.0–10.5)

## 2011-08-22 LAB — COMPREHENSIVE METABOLIC PANEL
ALT: 25 U/L (ref 0–53)
AST: 17 U/L (ref 0–37)
Albumin: 3.3 g/dL — ABNORMAL LOW (ref 3.5–5.2)
Alkaline Phosphatase: 82 U/L (ref 39–117)
BUN: 32 mg/dL — ABNORMAL HIGH (ref 6–23)
CO2: 23 mEq/L (ref 19–32)
Calcium: 8.8 mg/dL (ref 8.4–10.5)
Chloride: 104 mEq/L (ref 96–112)
Creatinine, Ser: 1.47 mg/dL — ABNORMAL HIGH (ref 0.50–1.35)
GFR calc Af Amer: 62 mL/min — ABNORMAL LOW (ref >90)
GFR calc non Af Amer: 53 mL/min — ABNORMAL LOW (ref >90)
Glucose, Bld: 308 mg/dL — ABNORMAL HIGH (ref 70–99)
Potassium: 4.4 mEq/L (ref 3.5–5.1)
Sodium: 138 mEq/L (ref 135–145)
Total Bilirubin: 0.4 mg/dL (ref 0.3–1.2)
Total Protein: 5.9 g/dL — ABNORMAL LOW (ref 6.0–8.3)

## 2011-08-22 LAB — DIFFERENTIAL
Basophils Absolute: 0 10*3/uL (ref 0.0–0.1)
Basophils Relative: 0 % (ref 0–1)
Eosinophils Absolute: 0 10*3/uL (ref 0.0–0.7)
Eosinophils Relative: 1 % (ref 0–5)
Lymphocytes Relative: 9 % — ABNORMAL LOW (ref 12–46)
Lymphs Abs: 0.6 10*3/uL — ABNORMAL LOW (ref 0.7–4.0)
Monocytes Absolute: 0.3 10*3/uL (ref 0.1–1.0)
Monocytes Relative: 4 % (ref 3–12)
Neutro Abs: 6.3 10*3/uL (ref 1.7–7.7)
Neutrophils Relative %: 87 % — ABNORMAL HIGH (ref 43–77)

## 2011-08-22 MED ORDER — BORTEZOMIB CHEMO SQ INJECTION 3.5 MG (2.5MG/ML)
1.3000 mg/m2 | Freq: Once | INTRAMUSCULAR | Status: AC
  Administered 2011-08-22: 3.5 mg via SUBCUTANEOUS
  Filled 2011-08-22: qty 1.4

## 2011-08-22 NOTE — Progress Notes (Signed)
[name] reports numbness and "heaviness" to bilateral feet only - denies any difficulty with ADLs, ambulating, etc.  Dr. [name] advised of same and velcade dose to be adjusted down to 3mg .  Labs drawn as scheduled per MD orders.  Venipuncture performed with a 23 gauge butterfly needle to [place] reports taking decadron and zofran as prescribed pta.  See MAR for velcade admin details.  Tolerated all procedures well and without incident; questions were answered and patient was discharged.  Patient verbalized understanding of when to follow-up for worsening symptoms of paresthesias.

## 2011-08-25 ENCOUNTER — Encounter (HOSPITAL_BASED_OUTPATIENT_CLINIC_OR_DEPARTMENT_OTHER): Payer: PRIVATE HEALTH INSURANCE

## 2011-08-25 DIAGNOSIS — Z5112 Encounter for antineoplastic immunotherapy: Secondary | ICD-10-CM

## 2011-08-25 DIAGNOSIS — C9000 Multiple myeloma not having achieved remission: Secondary | ICD-10-CM

## 2011-08-25 LAB — KAPPA/LAMBDA LIGHT CHAINS
Kappa free light chain: 2.45 mg/dL — ABNORMAL HIGH (ref 0.33–1.94)
Kappa, lambda light chain ratio: 1.4 (ref 0.26–1.65)
Lambda free light chains: 1.75 mg/dL (ref 0.57–2.63)

## 2011-08-25 MED ORDER — BORTEZOMIB CHEMO SQ INJECTION 3.5 MG (2.5MG/ML)
1.1700 mg/m2 | Freq: Once | INTRAMUSCULAR | Status: AC
  Administered 2011-08-25: 3 mg via SUBCUTANEOUS
  Filled 2011-08-25: qty 1.2

## 2011-08-25 NOTE — Progress Notes (Signed)
[name] presents today for injection per the provider's orders.  Velcade administration without incident; see MAR for injection details.  Patient tolerated procedure well and without incident.  No questions or complaints noted at this time.  Pt reports taking his zofran at 1445 today.

## 2011-08-27 LAB — MULTIPLE MYELOMA PANEL, SERUM
Albumin ELP: 56.3 % (ref 55.8–66.1)
Alpha-1-Globulin: 7 % — ABNORMAL HIGH (ref 2.9–4.9)
Alpha-2-Globulin: 13.7 % — ABNORMAL HIGH (ref 7.1–11.8)
Beta 2: 5.7 % (ref 3.2–6.5)
Beta Globulin: 6.6 % (ref 4.7–7.2)
Gamma Globulin: 10.7 % — ABNORMAL LOW (ref 11.1–18.8)
IgA: 82 mg/dL (ref 68–379)
IgG (Immunoglobin G), Serum: 601 mg/dL — ABNORMAL LOW (ref 650–1600)
IgM, Serum: 98 mg/dL (ref 41–251)
M-Spike, %: NOT DETECTED g/dL
Total Protein: 6.5 g/dL (ref 6.0–8.3)

## 2011-08-28 ENCOUNTER — Encounter (HOSPITAL_BASED_OUTPATIENT_CLINIC_OR_DEPARTMENT_OTHER): Payer: PRIVATE HEALTH INSURANCE

## 2011-08-28 DIAGNOSIS — L089 Local infection of the skin and subcutaneous tissue, unspecified: Secondary | ICD-10-CM

## 2011-08-28 DIAGNOSIS — C9000 Multiple myeloma not having achieved remission: Secondary | ICD-10-CM

## 2011-08-28 DIAGNOSIS — Z5112 Encounter for antineoplastic immunotherapy: Secondary | ICD-10-CM

## 2011-08-28 MED ORDER — CLINDAMYCIN PHOSPHATE 1 % EX GEL: Freq: Two times a day (BID) | CUTANEOUS | Status: DC

## 2011-08-28 MED ORDER — BORTEZOMIB CHEMO SQ INJECTION 3.5 MG (2.5MG/ML)
1.1700 mg/m2 | Freq: Once | INTRAMUSCULAR | Status: AC
  Administered 2011-08-28: 3 mg via SUBCUTANEOUS
  Filled 2011-08-28: qty 1.2

## 2011-08-28 NOTE — Progress Notes (Signed)
JOHSUA SCHLIEPER presents today for injection per the provider's orders.  Velcade administered administration without incident; see MAR for injection details; pt reports taking zofran and decadron as prescribed pta.  Patient tolerated procedure well and without incident.  No questions or complaints noted at this time.  Patient is noted to have an area of redness to his left lower abdomen - site eval'd by T. Kefalas, PA-C and topical abx prescribed for pt.  Roda Shutters also instructed of signs/symptoms to look for that may indicate worsening condition at the site.

## 2011-08-29 ENCOUNTER — Inpatient Hospital Stay (HOSPITAL_COMMUNITY): Payer: PRIVATE HEALTH INSURANCE

## 2011-09-01 ENCOUNTER — Encounter (HOSPITAL_COMMUNITY): Payer: PRIVATE HEALTH INSURANCE | Attending: Oncology

## 2011-09-01 ENCOUNTER — Ambulatory Visit (HOSPITAL_COMMUNITY): Payer: PRIVATE HEALTH INSURANCE | Admitting: Oncology

## 2011-09-01 DIAGNOSIS — Z5112 Encounter for antineoplastic immunotherapy: Secondary | ICD-10-CM

## 2011-09-01 DIAGNOSIS — C9000 Multiple myeloma not having achieved remission: Secondary | ICD-10-CM | POA: Insufficient documentation

## 2011-09-01 DIAGNOSIS — R197 Diarrhea, unspecified: Secondary | ICD-10-CM | POA: Insufficient documentation

## 2011-09-01 DIAGNOSIS — E860 Dehydration: Secondary | ICD-10-CM | POA: Insufficient documentation

## 2011-09-01 DIAGNOSIS — R059 Cough, unspecified: Secondary | ICD-10-CM | POA: Insufficient documentation

## 2011-09-01 DIAGNOSIS — R609 Edema, unspecified: Secondary | ICD-10-CM | POA: Insufficient documentation

## 2011-09-01 DIAGNOSIS — R05 Cough: Secondary | ICD-10-CM | POA: Insufficient documentation

## 2011-09-01 LAB — CBC
HCT: 34.7 % — ABNORMAL LOW (ref 39.0–52.0)
Hemoglobin: 11.8 g/dL — ABNORMAL LOW (ref 13.0–17.0)
MCH: 30 pg (ref 26.0–34.0)
MCHC: 34 g/dL (ref 30.0–36.0)
MCV: 88.3 fL (ref 78.0–100.0)
Platelets: 122 10*3/uL — ABNORMAL LOW (ref 150–400)
RBC: 3.93 MIL/uL — ABNORMAL LOW (ref 4.22–5.81)
RDW: 14.2 % (ref 11.5–15.5)
WBC: 7.5 10*3/uL (ref 4.0–10.5)

## 2011-09-01 LAB — DIFFERENTIAL
Basophils Absolute: 0 10*3/uL (ref 0.0–0.1)
Basophils Relative: 1 % (ref 0–1)
Eosinophils Absolute: 0.6 10*3/uL (ref 0.0–0.7)
Eosinophils Relative: 8 % — ABNORMAL HIGH (ref 0–5)
Lymphocytes Relative: 15 % (ref 12–46)
Lymphs Abs: 1.2 10*3/uL (ref 0.7–4.0)
Monocytes Absolute: 0.8 10*3/uL (ref 0.1–1.0)
Monocytes Relative: 10 % (ref 3–12)
Neutro Abs: 4.9 10*3/uL (ref 1.7–7.7)
Neutrophils Relative %: 66 % (ref 43–77)

## 2011-09-01 MED ORDER — BORTEZOMIB CHEMO SQ INJECTION 3.5 MG (2.5MG/ML)
1.1700 mg/m2 | Freq: Once | INTRAMUSCULAR | Status: AC
  Administered 2011-09-01: 3 mg via SUBCUTANEOUS
  Filled 2011-09-01: qty 1.2

## 2011-09-02 ENCOUNTER — Encounter (HOSPITAL_COMMUNITY): Payer: Self-pay | Admitting: Oncology

## 2011-09-02 ENCOUNTER — Encounter (HOSPITAL_BASED_OUTPATIENT_CLINIC_OR_DEPARTMENT_OTHER): Payer: PRIVATE HEALTH INSURANCE | Admitting: Oncology

## 2011-09-02 DIAGNOSIS — C9000 Multiple myeloma not having achieved remission: Secondary | ICD-10-CM

## 2011-09-02 NOTE — Progress Notes (Signed)
This office note has been dictated.

## 2011-09-02 NOTE — Patient Instructions (Signed)
[name]  [id number] [birth year]   [facility]  Discharge Instructions  RECOMMENDATIONS MADE BY THE CONSULTANT AND ANY TEST RESULTS WILL BE SENT TO YOUR REFERRING DOCTOR.   EXAM FINDINGS BY MD TODAY AND SIGNS AND SYMPTOMS TO REPORT TO CLINIC OR PRIMARY MD: Per MD the chemotherapy is working well.  Your light chain assay is almost back to normal.  Continue using the antibiotic on the area on your abdomen until Sunday, then you can stop it.  MEDICATIONS PRESCRIBED: none   INSTRUCTIONS GIVEN AND DISCUSSED: Other :  Report fevers, chills, uncontrolled nausea or vomiting.  SPECIAL INSTRUCTIONS/FOLLOW-UP: Return to Clinic as scheduled for chemotherapy and to see PA in 1 month.   I acknowledge that I have been informed and understand all the instructions given to me and received a copy. I do not have any more questions at this time, but understand that I may call the Specialty Clinic at [facility] at [phone] during business hours should I have any further questions or need assistance in obtaining follow-up care.    __________________________________________  _____________  __________ Signature of Patient or Authorized Representative            Date                   Time    __________________________________________ Nurse's Signature

## 2011-09-02 NOTE — Progress Notes (Signed)
CC:   Barrie Folk. Hill, MD  DIAGNOSES: 1. Stage II multiple myeloma with a beta 2 microglobulin level less     than 3.5 and albumin of 3.4, and of course, he presented with Kappa     light chain disease. 2. Sinusitis, which has resolved. 3. Morbid obesity, status post an attempt at gastric bypass surgery in     the past. [name] is here today with his wife, and he is in his 3rd cycle of chemotherapy.  He is about to embark really on cycle 4.  His insurance will not cover seeing Dr. [name] again.  He has an appointment with Dr. Virgina Norfolk at [facility] on Wednesday of next week.  We have reduced his Velcade dose slightly from 1.3 mg/sq. m to 1.17 mg/sq. m, and that has helped him with the numbness in his feet.  It is at most grade 1 peripheral neuropathy at this point in time.  He still has some numbness, especially on the dorsum of his feet.  He is still getting his Zometa monthly.  What is also nice is that his labs continue to show nice decreases in his kappa free light chains.  His most recent one on 08/22/2011 was 2.45 mg/dL with the normal range being 0.33 to 1.94 mg/dL.  His sugar is still a little too high, of course.  Creatinine, though, is very nice at 1.47.  His CBC is quite good, though his platelets were slightly low at 122,000 yesterday. Hemoglobin still strong at 11.8 g.  White count 7500 with an unremarkable differential except for few eosinophils on a percentage basis but a normal count on an absolute basis.  His glucose was 308, however.  So [name] looks good today.  He has a little bit of discomfort intermittently when he moves, it is in the right chest wall.  It is not affecting his breathing.  He is not awakened with shortness of breath or chest pain.  This is again only in the last few days to a week, but he has had 2 bronchitis episode/sinusitis with one of them, and he is finally, I think, over them.  He is bringing up a clear phlegm at most. He is coughing a  little bit in the last 3 days, but it is very mild. There are no chills, no fever, no yellow or green material coming out of his lungs or nose  PHYSICAL EXAMINATION:  He still remains morbidly obese.  No obvious lymph nodes in the cervical or supraclavicular or axillary areas.  His lungs are very clear today.  His heart shows a regular rhythm and rate. I did not hear an S3 gallop or murmur.  His Port-A-Cath is intact. There is a lesion in the left lower anterior abdominal wall, where it may have been a little pustule, but he is putting on some antibiotic cream that my PA gave him, I believe it is clindamycin-based.  He is still on his Bactrim, 1 Mondays Wednesdays and Fridays.  He is still on his acyclovir daily and his other medications for his blood pressure.  His [name] is 25 mg as recommended by Dr. [name] and, again, he is also on the Velcade.  We will see him in a month one way or the other.  We will see what Dr. Clydene Laming at [facility] says about his possibly of being a candidate for bone marrow transplant, but again he is only 50.  He is having a super response thus far.  ______________________________ [name]. [name], MD ESN/MEDQ  D:  09/02/2011  T:  09/02/2011  Job:  [id number]

## 2011-09-05 ENCOUNTER — Encounter (HOSPITAL_BASED_OUTPATIENT_CLINIC_OR_DEPARTMENT_OTHER): Payer: PRIVATE HEALTH INSURANCE

## 2011-09-05 DIAGNOSIS — C9000 Multiple myeloma not having achieved remission: Secondary | ICD-10-CM

## 2011-09-05 MED ORDER — SODIUM CHLORIDE 0.9 % IV SOLN
2.0000 mg | Freq: Once | INTRAVENOUS | Status: AC
  Administered 2011-09-05: 2 mg via INTRAVENOUS
  Filled 2011-09-05: qty 2.5

## 2011-09-05 MED ORDER — SODIUM CHLORIDE 0.9 % IV SOLN
Freq: Once | INTRAVENOUS | Status: AC
  Administered 2011-09-05: 250 mL via INTRAVENOUS

## 2011-09-05 MED ORDER — SODIUM CHLORIDE 0.9 % IJ SOLN
10.0000 mL | INTRAMUSCULAR | Status: DC | PRN
  Administered 2011-09-05: 10 mL
  Filled 2011-09-05: qty 10

## 2011-09-05 MED ORDER — SODIUM CHLORIDE 0.9 % IJ SOLN
INTRAMUSCULAR | Status: AC
  Filled 2011-09-05: qty 10

## 2011-09-05 MED ORDER — HEPARIN SOD (PORK) LOCK FLUSH 100 UNIT/ML IV SOLN
INTRAVENOUS | Status: AC
  Filled 2011-09-05: qty 5

## 2011-09-05 MED ORDER — HEPARIN SOD (PORK) LOCK FLUSH 100 UNIT/ML IV SOLN
500.0000 [IU] | Freq: Once | INTRAVENOUS | Status: AC | PRN
  Administered 2011-09-05: 500 [IU]
  Filled 2011-09-05: qty 5

## 2011-09-05 NOTE — Progress Notes (Signed)
Margrett Rud Bartolini tolerated infusion well and without incident; verbalizes understanding for follow-up.  No distress noted at time of discharge and patient was discharged home with family.

## 2011-09-08 ENCOUNTER — Encounter (HOSPITAL_BASED_OUTPATIENT_CLINIC_OR_DEPARTMENT_OTHER): Payer: PRIVATE HEALTH INSURANCE | Admitting: Oncology

## 2011-09-08 ENCOUNTER — Encounter (HOSPITAL_COMMUNITY): Payer: PRIVATE HEALTH INSURANCE

## 2011-09-08 ENCOUNTER — Encounter (HOSPITAL_BASED_OUTPATIENT_CLINIC_OR_DEPARTMENT_OTHER): Payer: PRIVATE HEALTH INSURANCE

## 2011-09-08 DIAGNOSIS — R059 Cough, unspecified: Secondary | ICD-10-CM

## 2011-09-08 DIAGNOSIS — R05 Cough: Secondary | ICD-10-CM

## 2011-09-08 DIAGNOSIS — R197 Diarrhea, unspecified: Secondary | ICD-10-CM

## 2011-09-08 DIAGNOSIS — C9000 Multiple myeloma not having achieved remission: Secondary | ICD-10-CM

## 2011-09-08 DIAGNOSIS — E860 Dehydration: Secondary | ICD-10-CM

## 2011-09-08 LAB — CBC
HCT: 31.7 % — ABNORMAL LOW (ref 39.0–52.0)
Hemoglobin: 10.7 g/dL — ABNORMAL LOW (ref 13.0–17.0)
MCH: 29.5 pg (ref 26.0–34.0)
MCHC: 33.8 g/dL (ref 30.0–36.0)
MCV: 87.3 fL (ref 78.0–100.0)
Platelets: 92 10*3/uL — ABNORMAL LOW (ref 150–400)
RBC: 3.63 MIL/uL — ABNORMAL LOW (ref 4.22–5.81)
RDW: 14.4 % (ref 11.5–15.5)
WBC: 6.1 10*3/uL (ref 4.0–10.5)

## 2011-09-08 LAB — COMPREHENSIVE METABOLIC PANEL
ALT: 23 U/L (ref 0–53)
AST: 17 U/L (ref 0–37)
Albumin: 2.9 g/dL — ABNORMAL LOW (ref 3.5–5.2)
Alkaline Phosphatase: 52 U/L (ref 39–117)
BUN: 42 mg/dL — ABNORMAL HIGH (ref 6–23)
CO2: 18 mEq/L — ABNORMAL LOW (ref 19–32)
Calcium: 6.3 mg/dL — CL (ref 8.4–10.5)
Chloride: 101 mEq/L (ref 96–112)
Creatinine, Ser: 1.76 mg/dL — ABNORMAL HIGH (ref 0.50–1.35)
GFR calc Af Amer: 50 mL/min — ABNORMAL LOW (ref >90)
GFR calc non Af Amer: 43 mL/min — ABNORMAL LOW (ref >90)
Glucose, Bld: 135 mg/dL — ABNORMAL HIGH (ref 70–99)
Potassium: 3.7 mEq/L (ref 3.5–5.1)
Sodium: 132 mEq/L — ABNORMAL LOW (ref 135–145)
Total Bilirubin: 0.5 mg/dL (ref 0.3–1.2)
Total Protein: 5.6 g/dL — ABNORMAL LOW (ref 6.0–8.3)

## 2011-09-08 LAB — DIFFERENTIAL
Basophils Absolute: 0.1 10*3/uL (ref 0.0–0.1)
Basophils Relative: 1 % (ref 0–1)
Eosinophils Absolute: 0.6 10*3/uL (ref 0.0–0.7)
Eosinophils Relative: 10 % — ABNORMAL HIGH (ref 0–5)
Lymphocytes Relative: 13 % (ref 12–46)
Lymphs Abs: 0.8 10*3/uL (ref 0.7–4.0)
Monocytes Absolute: 1.1 10*3/uL — ABNORMAL HIGH (ref 0.1–1.0)
Monocytes Relative: 18 % — ABNORMAL HIGH (ref 3–12)
Neutro Abs: 3.6 10*3/uL (ref 1.7–7.7)
Neutrophils Relative %: 58 % (ref 43–77)

## 2011-09-08 LAB — CLOSTRIDIUM DIFFICILE BY PCR: Toxigenic C. Difficile by PCR: NEGATIVE

## 2011-09-08 MED ORDER — HYDROCOD POLST-CHLORPHEN POLST 10-8 MG/5ML PO LQCR
5.0000 mL | ORAL | Status: AC
  Administered 2011-09-08: 5 mL via ORAL
  Filled 2011-09-08: qty 5

## 2011-09-08 MED ORDER — HYDROCOD POLST-CHLORPHEN POLST 10-8 MG/5ML PO LQCR: 5.0000 mL | Freq: Two times a day (BID) | ORAL | Status: DC

## 2011-09-08 MED ORDER — SODIUM CHLORIDE 0.9 % IV SOLN
INTRAVENOUS | Status: DC
  Administered 2011-09-08: 15:00:00 via INTRAVENOUS

## 2011-09-08 NOTE — Progress Notes (Signed)
This office note has been dictated.

## 2011-09-09 ENCOUNTER — Encounter (HOSPITAL_BASED_OUTPATIENT_CLINIC_OR_DEPARTMENT_OTHER): Payer: PRIVATE HEALTH INSURANCE

## 2011-09-09 ENCOUNTER — Ambulatory Visit (HOSPITAL_COMMUNITY)
Admission: RE | Admit: 2011-09-09 | Discharge: 2011-09-09 | Disposition: A | Discharge status: Home / Self Care | Payer: PRIVATE HEALTH INSURANCE | Source: Ambulatory Visit | Attending: Oncology | Admitting: Oncology

## 2011-09-09 DIAGNOSIS — R059 Cough, unspecified: Secondary | ICD-10-CM | POA: Insufficient documentation

## 2011-09-09 DIAGNOSIS — R918 Other nonspecific abnormal finding of lung field: Secondary | ICD-10-CM | POA: Insufficient documentation

## 2011-09-09 DIAGNOSIS — C9000 Multiple myeloma not having achieved remission: Secondary | ICD-10-CM | POA: Insufficient documentation

## 2011-09-09 DIAGNOSIS — R197 Diarrhea, unspecified: Secondary | ICD-10-CM

## 2011-09-09 DIAGNOSIS — I517 Cardiomegaly: Secondary | ICD-10-CM | POA: Insufficient documentation

## 2011-09-09 DIAGNOSIS — R05 Cough: Secondary | ICD-10-CM | POA: Insufficient documentation

## 2011-09-09 MED ORDER — SODIUM CHLORIDE 0.9 % IJ SOLN
INTRAMUSCULAR | Status: AC
  Filled 2011-09-09: qty 10

## 2011-09-09 MED ORDER — SODIUM CHLORIDE 0.9 % IV SOLN
INTRAVENOUS | Status: DC
  Administered 2011-09-09: 10:00:00 via INTRAVENOUS

## 2011-09-09 NOTE — Progress Notes (Signed)
CC:   [name]. Franklin Addison, MD  [name] is here today for followup as a work-in really.  He has had diarrhea since at least last week.  He actually states that he started having some looseness to his stools when he started the chemotherapy with Velcade and [name].  It has gotten worse in the last week, especially in the last 4 days.  He has gone 7 8 times a day on Saturday and Sunday. He was actually incontinent of stool  this morning when he woke up.  He had LapBand but did not have gastric bypass.  He has stated that these stools are perfectly loose and he did produce a sample later in the day, which is perfectly loose.  We will set him up for C. difficile analysis.  He is not febrile today, and his vital signs show his blood pressure is lower than usual at 98/68 in the right arm, sitting position. Pulse is interestingly right around 76.  He is afebrile.  He is coughing a lot, and in the last 2 months, in February he was treated with a Z-Pak and in early March, he was treated with Augmentin.  It has been 3 weeks since he has been treated.  It has gotten worse in the last week obviously, especially the last 3-4 days, this diarrhea.  He is not having nausea or vomiting.  He is coughing but not bringing up anything.  Negative chest x-ray.  We are going to hydrate him today since his BUN and creatinine ratio was elevated.  We are going to stop the [name], stop the Velcade until these stop completely.  We did reduce his dose of Velcade.  We actually amended it based upon his weight, even in the beginning and we have reduced it since then at least once.  I suspect I have to reduce the [name] and Velcade further.  The [name] has never been reduced.  So will bring him back tomorrow for fluids as well.  Will await the C difficile and see what he looks like in the morning.  His CBC I did not do in as far as his platelets are 92,000 because they are adequate.  His white count 6100, hemoglobin  10.7, but he is probably actually a dry with a BUN of 42 creatinine 1.7.  He has adequate neutrophils.  Glucose 135.  His calcium interestingly was low at 6.3, albumin is low at 2.9 but it was 3.3 two weeks ago and his calcium is 8.8.  He is also having hand cramps, I forgot to mention in the history.  That is probably from his calcium. He is going to take 2 calcium tablets a day.  I have talked to his wife about starting that. I want him back here tomorrow for fluids.  We did do a chest x-ray on the 18th of March, and he was clear at that time.  ADD: C. Diff. is negative.   ______________________________ [name]. [name], MD ESN/MEDQ  D:  09/08/2011  T:  09/09/2011  Job:  [id number]

## 2011-09-09 NOTE — Progress Notes (Signed)
Fluid infusion complete, patient tolerated well.  Diarrhea episode x1 this treatment day.

## 2011-09-10 LAB — CULTURE, RESPIRATORY W GRAM STAIN
Culture: NORMAL
Gram Stain: NONE SEEN

## 2011-09-11 ENCOUNTER — Telehealth (HOSPITAL_COMMUNITY): Payer: Self-pay | Admitting: Oncology

## 2011-09-11 NOTE — Telephone Encounter (Signed)
Patient complains of lower extremity edema. This is likely secondary to the fluid he received when he was ill and mildly dehydrated here at the clinic IV. He reports that are painful. He reports that there are bilaterally edematous. He denies any serous fluid leaking from his lower extremities. He reports that he is able to use his finger and create an indentation in his lower extremities. I gave the patient some education regarding his lower extremity edema. I've encouraged him to keep his lower extremities elevated above the level of his belly button. He understands the reasoning for the lower extremity edema.  I've encouraged him to utilize Tylenol or an NSAID and 4 his discomfort. At this time we will not provide the patient with a diuretic. The patient reports that he return to clinic tomorrow for a Velcade injection. At that time we can evaluate his lower extremities. He will attempt to keep his lower extremities elevated for the remainder of the day and we will see how this does for him tomorrow. We may need to consider compression stockings if this does not resolve readily for the patient.  [name]

## 2011-09-12 ENCOUNTER — Ambulatory Visit (HOSPITAL_COMMUNITY)
Admission: RE | Admit: 2011-09-12 | Discharge: 2011-09-12 | Disposition: A | Discharge status: Home / Self Care | Payer: PRIVATE HEALTH INSURANCE | Source: Ambulatory Visit | Attending: Oncology | Admitting: Oncology

## 2011-09-12 ENCOUNTER — Other Ambulatory Visit (HOSPITAL_COMMUNITY): Payer: Self-pay

## 2011-09-12 ENCOUNTER — Encounter (HOSPITAL_BASED_OUTPATIENT_CLINIC_OR_DEPARTMENT_OTHER): Payer: PRIVATE HEALTH INSURANCE

## 2011-09-12 ENCOUNTER — Encounter (HOSPITAL_BASED_OUTPATIENT_CLINIC_OR_DEPARTMENT_OTHER): Payer: PRIVATE HEALTH INSURANCE | Admitting: Oncology

## 2011-09-12 DIAGNOSIS — M7989 Other specified soft tissue disorders: Secondary | ICD-10-CM | POA: Insufficient documentation

## 2011-09-12 DIAGNOSIS — M79609 Pain in unspecified limb: Secondary | ICD-10-CM | POA: Insufficient documentation

## 2011-09-12 DIAGNOSIS — C9000 Multiple myeloma not having achieved remission: Secondary | ICD-10-CM

## 2011-09-12 DIAGNOSIS — R600 Localized edema: Secondary | ICD-10-CM

## 2011-09-12 DIAGNOSIS — R609 Edema, unspecified: Secondary | ICD-10-CM

## 2011-09-12 DIAGNOSIS — Z5112 Encounter for antineoplastic immunotherapy: Secondary | ICD-10-CM

## 2011-09-12 LAB — DIFFERENTIAL
Basophils Absolute: 0 10*3/uL (ref 0.0–0.1)
Basophils Relative: 0 % (ref 0–1)
Eosinophils Absolute: 0.1 10*3/uL (ref 0.0–0.7)
Eosinophils Relative: 1 % (ref 0–5)
Lymphocytes Relative: 11 % — ABNORMAL LOW (ref 12–46)
Lymphs Abs: 0.7 10*3/uL (ref 0.7–4.0)
Monocytes Absolute: 0.3 10*3/uL (ref 0.1–1.0)
Monocytes Relative: 5 % (ref 3–12)
Neutro Abs: 5.3 10*3/uL (ref 1.7–7.7)
Neutrophils Relative %: 83 % — ABNORMAL HIGH (ref 43–77)

## 2011-09-12 LAB — COMPREHENSIVE METABOLIC PANEL
ALT: 21 U/L (ref 0–53)
AST: 18 U/L (ref 0–37)
Albumin: 3 g/dL — ABNORMAL LOW (ref 3.5–5.2)
Alkaline Phosphatase: 69 U/L (ref 39–117)
BUN: 26 mg/dL — ABNORMAL HIGH (ref 6–23)
CO2: 18 mEq/L — ABNORMAL LOW (ref 19–32)
Calcium: 7.7 mg/dL — ABNORMAL LOW (ref 8.4–10.5)
Chloride: 108 mEq/L (ref 96–112)
Creatinine, Ser: 1.58 mg/dL — ABNORMAL HIGH (ref 0.50–1.35)
GFR calc Af Amer: 56 mL/min — ABNORMAL LOW (ref >90)
GFR calc non Af Amer: 49 mL/min — ABNORMAL LOW (ref >90)
Glucose, Bld: 197 mg/dL — ABNORMAL HIGH (ref 70–99)
Potassium: 4 mEq/L (ref 3.5–5.1)
Sodium: 139 mEq/L (ref 135–145)
Total Bilirubin: 0.4 mg/dL (ref 0.3–1.2)
Total Protein: 6.2 g/dL (ref 6.0–8.3)

## 2011-09-12 LAB — CBC
HCT: 33.5 % — ABNORMAL LOW (ref 39.0–52.0)
Hemoglobin: 11.4 g/dL — ABNORMAL LOW (ref 13.0–17.0)
MCH: 29.5 pg (ref 26.0–34.0)
MCHC: 34 g/dL (ref 30.0–36.0)
MCV: 86.8 fL (ref 78.0–100.0)
Platelets: 157 10*3/uL (ref 150–400)
RBC: 3.86 MIL/uL — ABNORMAL LOW (ref 4.22–5.81)
RDW: 14.6 % (ref 11.5–15.5)
WBC: 6.4 10*3/uL (ref 4.0–10.5)

## 2011-09-12 MED ORDER — SPIRONOLACTONE 25 MG PO TABS: 25.0000 mg | ORAL_TABLET | Freq: Two times a day (BID) | ORAL | Status: DC

## 2011-09-12 MED ORDER — ONDANSETRON HCL 4 MG PO TABS
ORAL_TABLET | ORAL | Status: AC
  Filled 2011-09-12: qty 2

## 2011-09-12 MED ORDER — HYDROCODONE-ACETAMINOPHEN 5-325 MG PO TABS: 1.0000 | ORAL_TABLET | Freq: Four times a day (QID) | ORAL | Status: DC | PRN

## 2011-09-12 MED ORDER — BORTEZOMIB CHEMO SQ INJECTION 3.5 MG (2.5MG/ML)
1.1700 mg/m2 | Freq: Once | INTRAMUSCULAR | Status: AC
  Administered 2011-09-12: 3 mg via SUBCUTANEOUS
  Filled 2011-09-12: qty 1.2

## 2011-09-12 MED ORDER — LENALIDOMIDE 25 MG PO CAPS: ORAL_CAPSULE | ORAL | Status: DC

## 2011-09-12 MED ORDER — ONDANSETRON HCL 4 MG PO TABS
8.0000 mg | ORAL_TABLET | Freq: Once | ORAL | Status: AC
  Administered 2011-09-12: 8 mg via ORAL
  Filled 2011-09-12: qty 1

## 2011-09-12 NOTE — Patient Instructions (Signed)
[facility]  Discharge Instructions  RECOMMENDATIONS MADE BY THE CONSULTANT AND ANY TEST RESULTS WILL BE SENT TO YOUR REFERRING DOCTOR.   EXAM FINDINGS BY MD TODAY AND SIGNS AND SYMPTOMS TO REPORT TO CLINIC OR PRIMARY MD: There were no blood clots in left leg. Dr [name] thinks this may be gout.  MEDICATIONS PRESCRIBED:  Finish the dexamethasone tonight. Start allopurinol 300 mg daily today and continue indefinitely. Tomorrow start prednisone 20 mg twice a day for 4 days.   Do not take revlimed until we advise on Monday.    I acknowledge that I have been informed and understand all the instructions given to me and received a copy. I do not have any more questions at this time, but understand that I may call the Specialty Clinic at [facility] at [phone] during business hours should I have any further questions or need assistance in obtaining follow-up care.    __________________________________________  _____________  __________ Signature of Patient or Authorized Representative            Date                   Time    __________________________________________ Nurse's Signature

## 2011-09-12 NOTE — Progress Notes (Signed)
Roda Shutters presents today for injection per MD orders. velcade 3 mg administered SQ in right Abdomen. Administration without incident. Patient tolerated well.

## 2011-09-12 NOTE — Progress Notes (Signed)
Subjective: Patient seen as a work in today. He is due for his Velcade injection today. Our nurse called the patient today following our discussion yesterday regarding his lower tremor the edema. He reports that it was not much better. As result we pursued a left lower extremity Doppler study to evaluate for a DVT. Prior to his Velcade injection, he reported radiology for this radiographic study be performed.  The results from the Doppler were negative for any signs of DVT.  The patient was seen in the chemotherapy administration room. He reports that he feels much better compared to last week when he may have had a viral or bacterial infection. He hydrated at that point time. He received a few liters of fluids on Monday and Tuesday. His cough is much improved.  Patient reports right pedal discomfort. He admits the right lower extremity he is a little more swollen than the left.  Right lower extremity any, subsequently, is more painful as well.  We set some time going over patient dictation regarding lower extremity edema, particularly in this gentleman is case following administration of fluids. I explained the patient that he must keep his lower extremity these elevated above the level of the umbilicus. He should continue utilizing his compression stockings. I'll so gave him 2 prescriptions and are outlined in the plan below.  The patient denies any chest pain or shortness of breath.  Objective: Gen.: Patient seen in a chemotherapy administration recliner in no acute distress. HEENT: Atraumatic normocephalic anicteric sclera Extremities: Bilateral 3+ pitting edema in the lower extremities. Right is greater than left. No popliteal or calf tenderness bilaterally. No appreciable erythema bilaterally. Some tenderness to palpation of the right foot. Compression stockings noted to be in place. Neuro: No focal deficits appreciated. The patient is alert oriented x3.  Assessment: 1. Bilateral lower  extremity edema. Negative left lower extremity Doppler for DVT. Edema likely secondary to fluid administration. 2. Stage II multiple myeloma, undergoing radical may 25 mg daily and Velcade injection on days 1, 4, 8, 11 every 21 days. 3. Morbid obesity weighing over 400 pounds.  Plan:  1. Prescription for spironolactone 25 mg. #15 with no refills. He should take one tablet twice daily. 2. Prescription for hydrocodone/APAP 5/325 mg #30 with 0 refills. He may take 1-2 tablets every 6 hours as needed for lower extremity the pain. 3. Patient education regarding lower tremor the edema and etiology. 4. Patient education regarding the importance of compression stockings and keeping lower tremor these elevated above the level of the umbilicus. The patient was also encouraged not to relax with his hips in a flexed position. This contributes to the lack of venous outflow of the lower extremities do to his obesity. 5. Patient will receive Velcade injection as scheduled today. 6. Return as scheduled for followup.  All questions were answered. The patient knows to call the clinic with any problems, questions, or concerns.  Patient and plan discussed with Dr. Everardo All and he is in agreement with the aforementioned.  Esmeralda Malay

## 2011-09-15 ENCOUNTER — Other Ambulatory Visit (HOSPITAL_COMMUNITY): Payer: PRIVATE HEALTH INSURANCE

## 2011-09-15 ENCOUNTER — Encounter (HOSPITAL_BASED_OUTPATIENT_CLINIC_OR_DEPARTMENT_OTHER): Payer: PRIVATE HEALTH INSURANCE

## 2011-09-15 DIAGNOSIS — C9000 Multiple myeloma not having achieved remission: Secondary | ICD-10-CM

## 2011-09-15 LAB — DIFFERENTIAL
Basophils Absolute: 0 10*3/uL (ref 0.0–0.1)
Basophils Relative: 0 % (ref 0–1)
Eosinophils Absolute: 0 10*3/uL (ref 0.0–0.7)
Eosinophils Relative: 0 % (ref 0–5)
Lymphocytes Relative: 10 % — ABNORMAL LOW (ref 12–46)
Lymphs Abs: 0.8 10*3/uL (ref 0.7–4.0)
Monocytes Absolute: 1.1 10*3/uL — ABNORMAL HIGH (ref 0.1–1.0)
Monocytes Relative: 13 % — ABNORMAL HIGH (ref 3–12)
Neutro Abs: 6.2 10*3/uL (ref 1.7–7.7)
Neutrophils Relative %: 77 % (ref 43–77)

## 2011-09-15 LAB — CBC
HCT: 32.7 % — ABNORMAL LOW (ref 39.0–52.0)
Hemoglobin: 11.3 g/dL — ABNORMAL LOW (ref 13.0–17.0)
MCH: 29.7 pg (ref 26.0–34.0)
MCHC: 34.6 g/dL (ref 30.0–36.0)
MCV: 86.1 fL (ref 78.0–100.0)
Platelets: 189 10*3/uL (ref 150–400)
RBC: 3.8 MIL/uL — ABNORMAL LOW (ref 4.22–5.81)
RDW: 14.1 % (ref 11.5–15.5)
WBC: 8.1 10*3/uL (ref 4.0–10.5)

## 2011-09-15 LAB — KAPPA/LAMBDA LIGHT CHAINS
Kappa free light chain: 3.77 mg/dL — ABNORMAL HIGH (ref 0.33–1.94)
Kappa, lambda light chain ratio: 0.78 (ref 0.26–1.65)
Lambda free light chains: 4.85 mg/dL — ABNORMAL HIGH (ref 0.57–2.63)

## 2011-09-15 LAB — BASIC METABOLIC PANEL
BUN: 44 mg/dL — ABNORMAL HIGH (ref 6–23)
CO2: 21 mEq/L (ref 19–32)
Calcium: 9.3 mg/dL (ref 8.4–10.5)
Chloride: 94 mEq/L — ABNORMAL LOW (ref 96–112)
Creatinine, Ser: 1.63 mg/dL — ABNORMAL HIGH (ref 0.50–1.35)
GFR calc Af Amer: 54 mL/min — ABNORMAL LOW (ref >90)
GFR calc non Af Amer: 47 mL/min — ABNORMAL LOW (ref >90)
Glucose, Bld: 403 mg/dL — ABNORMAL HIGH (ref 70–99)
Potassium: 4.3 mEq/L (ref 3.5–5.1)
Sodium: 129 mEq/L — ABNORMAL LOW (ref 135–145)

## 2011-09-15 MED ORDER — BORTEZOMIB CHEMO SQ INJECTION 3.5 MG (2.5MG/ML): 1.1700 mg/m2 | Freq: Once | INTRAMUSCULAR | Status: DC

## 2011-09-15 MED ORDER — ONDANSETRON HCL 4 MG PO TABS: 8.0000 mg | ORAL_TABLET | Freq: Once | ORAL | Status: DC

## 2011-09-15 NOTE — Progress Notes (Signed)
Chemotherapy held after Dr.Neijstrom spoke with pt. We will postpone chemo for 1 more week. MD reviewed diet with pt. Information given to pt for a purine restricted diet.[name] presented for labwork. Labs per MD order drawn via Peripheral Line 23 gauge needle inserted in right antecubital.  Good blood return present. Procedure without incident.  Needle removed intact. Patient tolerated procedure well.

## 2011-09-15 NOTE — Progress Notes (Signed)
Addended by: Kurtis Bushman A on: 09/15/2011 04:52 PM   Modules accepted: Orders

## 2011-09-16 ENCOUNTER — Telehealth (HOSPITAL_COMMUNITY): Payer: Self-pay | Admitting: *Deleted

## 2011-09-16 LAB — MULTIPLE MYELOMA PANEL, SERUM
Albumin ELP: 50.6 % — ABNORMAL LOW (ref 55.8–66.1)
Alpha-1-Globulin: 9.6 % — ABNORMAL HIGH (ref 2.9–4.9)
Alpha-2-Globulin: 17.5 % — ABNORMAL HIGH (ref 7.1–11.8)
Beta 2: 6.4 % (ref 3.2–6.5)
Beta Globulin: 5.6 % (ref 4.7–7.2)
Gamma Globulin: 10.3 % — ABNORMAL LOW (ref 11.1–18.8)
IgA: 139 mg/dL (ref 68–379)
IgG (Immunoglobin G), Serum: 593 mg/dL — ABNORMAL LOW (ref 650–1600)
IgM, Serum: 84 mg/dL (ref 41–251)
M-Spike, %: NOT DETECTED g/dL
Total Protein: 5.9 g/dL — ABNORMAL LOW (ref 6.0–8.3)

## 2011-09-16 NOTE — Telephone Encounter (Signed)
Message copied by Joice Lofts on Tue Sep 16, 2011  2:30 PM ------      Message from: [name], ERIC S      Created: Tue Sep 16, 2011 11:02 AM       Call him about his glucose and he needs to drink more fluids i.e. Water!

## 2011-09-16 NOTE — Telephone Encounter (Signed)
Notified pt of below info. He has been on 4 days of prednisone. He will check blood sugar tonight and in am and let us know. He has sliding scale insulin that he does in am if greater than 175 and victoza that he does at night. Will increase fluids, we also have him on spironolactone as of 4/12.

## 2011-09-17 ENCOUNTER — Telehealth (HOSPITAL_COMMUNITY): Payer: Self-pay | Admitting: *Deleted

## 2011-09-17 NOTE — Telephone Encounter (Signed)
Pt called to report blood glucose 330 this am. He went up to 470 last night (after eating). He finished prednisone yesterday.

## 2011-09-19 ENCOUNTER — Inpatient Hospital Stay (HOSPITAL_COMMUNITY): Payer: PRIVATE HEALTH INSURANCE

## 2011-09-22 ENCOUNTER — Telehealth (HOSPITAL_COMMUNITY): Payer: Self-pay | Admitting: Dietician

## 2011-09-22 ENCOUNTER — Other Ambulatory Visit (HOSPITAL_COMMUNITY): Payer: Self-pay | Admitting: Oncology

## 2011-09-22 ENCOUNTER — Encounter (HOSPITAL_BASED_OUTPATIENT_CLINIC_OR_DEPARTMENT_OTHER): Payer: PRIVATE HEALTH INSURANCE

## 2011-09-22 DIAGNOSIS — Z5112 Encounter for antineoplastic immunotherapy: Secondary | ICD-10-CM

## 2011-09-22 DIAGNOSIS — C9000 Multiple myeloma not having achieved remission: Secondary | ICD-10-CM

## 2011-09-22 MED ORDER — BORTEZOMIB CHEMO SQ INJECTION 3.5 MG (2.5MG/ML)
1.1700 mg/m2 | Freq: Once | INTRAMUSCULAR | Status: AC
  Administered 2011-09-22: 3 mg via SUBCUTANEOUS
  Filled 2011-09-22: qty 1.2

## 2011-09-22 NOTE — Patient Instructions (Signed)
[facility] Discharge Instructions for Patients Receiving Chemotherapy  Today you received the following chemotherapy agents velcade.  Restart your revlimed and take it for only 10 days this time then you will return to 14 days on and 7 days off.  To help prevent nausea and vomiting after your treatment, we encourage you to take your nausea medication Zofran 8 mg prior to injection as ordered and 2 times a day as needed for nausea after that.  If you develop nausea and vomiting that is not controlled by your nausea medication, call the clinic. If it is after clinic hours your family physician or the after hours number for the clinic or go to the Emergency Department.   BELOW ARE SYMPTOMS THAT SHOULD BE REPORTED IMMEDIATELY:  *FEVER GREATER THAN 101.0 F  *CHILLS WITH OR WITHOUT FEVER  NAUSEA AND VOMITING THAT IS NOT CONTROLLED WITH YOUR NAUSEA MEDICATION  *UNUSUAL SHORTNESS OF BREATH  *UNUSUAL BRUISING OR BLEEDING  TENDERNESS IN MOUTH AND THROAT WITH OR WITHOUT PRESENCE OF ULCERS  *URINARY PROBLEMS  *BOWEL PROBLEMS  UNUSUAL RASH Items with * indicate a potential emergency and should be followed up as soon as possible.  One of the nurses will contact you 24 hours after your treatment. Please let the nurse know about any problems that you may have experienced. Feel free to call the clinic you have any questions or concerns. The clinic phone number is [phone].   I have been informed and understand all the instructions given to me. I know to contact the clinic, my physician, or go to the Emergency Department if any problems should occur. I do not have any questions at this time, but understand that I may call the clinic during office hours or the Patient Navigator at [phone] should I have any questions or need assistance in obtaining follow up care.    __________________________________________  _____________  __________ Signature of Patient or  Authorized Representative            Date                   Time    __________________________________________ Nurse's Signature

## 2011-09-22 NOTE — Progress Notes (Signed)
[name] presents today for injection per MD orders. Velcade 3 mg administered SQ in right Abdomen. Administration without incident. Patient tolerated well.

## 2011-09-22 NOTE — Telephone Encounter (Signed)
[name] reports pt to be referred for dx: gout, weight loss. Appointment scheduled for Thursday, 09/25/11 at 4:30 PM after his infusion appointment at [facility]. Sent appointment confirmation letter to pt home via [place] [employer].

## 2011-09-23 ENCOUNTER — Other Ambulatory Visit: Payer: Self-pay

## 2011-09-23 ENCOUNTER — Encounter (HOSPITAL_COMMUNITY): Payer: Self-pay | Admitting: *Deleted

## 2011-09-23 ENCOUNTER — Other Ambulatory Visit (HOSPITAL_COMMUNITY): Payer: Self-pay | Admitting: Oncology

## 2011-09-23 ENCOUNTER — Observation Stay (HOSPITAL_COMMUNITY)
Admission: EM | Admit: 2011-09-23 | Discharge: 2011-09-25 | DRG: 392 | Disposition: A | Discharge status: Home / Self Care | Payer: PRIVATE HEALTH INSURANCE | Attending: Internal Medicine | Admitting: Internal Medicine

## 2011-09-23 DIAGNOSIS — T451X5A Adverse effect of antineoplastic and immunosuppressive drugs, initial encounter: Secondary | ICD-10-CM | POA: Diagnosis present

## 2011-09-23 DIAGNOSIS — R05 Cough: Secondary | ICD-10-CM

## 2011-09-23 DIAGNOSIS — G4733 Obstructive sleep apnea (adult) (pediatric): Secondary | ICD-10-CM | POA: Diagnosis present

## 2011-09-23 DIAGNOSIS — N189 Chronic kidney disease, unspecified: Secondary | ICD-10-CM | POA: Diagnosis present

## 2011-09-23 DIAGNOSIS — I519 Heart disease, unspecified: Secondary | ICD-10-CM | POA: Diagnosis present

## 2011-09-23 DIAGNOSIS — E785 Hyperlipidemia, unspecified: Secondary | ICD-10-CM | POA: Diagnosis present

## 2011-09-23 DIAGNOSIS — Z794 Long term (current) use of insulin: Secondary | ICD-10-CM

## 2011-09-23 DIAGNOSIS — I129 Hypertensive chronic kidney disease with stage 1 through stage 4 chronic kidney disease, or unspecified chronic kidney disease: Secondary | ICD-10-CM | POA: Diagnosis present

## 2011-09-23 DIAGNOSIS — Z9221 Personal history of antineoplastic chemotherapy: Secondary | ICD-10-CM

## 2011-09-23 DIAGNOSIS — E119 Type 2 diabetes mellitus without complications: Secondary | ICD-10-CM

## 2011-09-23 DIAGNOSIS — I951 Orthostatic hypotension: Secondary | ICD-10-CM | POA: Diagnosis present

## 2011-09-23 DIAGNOSIS — R197 Diarrhea, unspecified: Principal | ICD-10-CM | POA: Diagnosis present

## 2011-09-23 DIAGNOSIS — R059 Cough, unspecified: Secondary | ICD-10-CM

## 2011-09-23 DIAGNOSIS — N19 Unspecified kidney failure: Secondary | ICD-10-CM

## 2011-09-23 DIAGNOSIS — Z9989 Dependence on other enabling machines and devices: Secondary | ICD-10-CM | POA: Diagnosis present

## 2011-09-23 DIAGNOSIS — G4730 Sleep apnea, unspecified: Secondary | ICD-10-CM

## 2011-09-23 DIAGNOSIS — R600 Localized edema: Secondary | ICD-10-CM | POA: Diagnosis present

## 2011-09-23 DIAGNOSIS — I10 Essential (primary) hypertension: Secondary | ICD-10-CM

## 2011-09-23 DIAGNOSIS — N179 Acute kidney failure, unspecified: Secondary | ICD-10-CM | POA: Diagnosis present

## 2011-09-23 DIAGNOSIS — E860 Dehydration: Secondary | ICD-10-CM | POA: Diagnosis present

## 2011-09-23 DIAGNOSIS — I2510 Atherosclerotic heart disease of native coronary artery without angina pectoris: Secondary | ICD-10-CM | POA: Diagnosis present

## 2011-09-23 DIAGNOSIS — E118 Type 2 diabetes mellitus with unspecified complications: Secondary | ICD-10-CM | POA: Diagnosis present

## 2011-09-23 DIAGNOSIS — IMO0001 Reserved for inherently not codable concepts without codable children: Secondary | ICD-10-CM | POA: Diagnosis present

## 2011-09-23 DIAGNOSIS — R609 Edema, unspecified: Secondary | ICD-10-CM | POA: Diagnosis present

## 2011-09-23 DIAGNOSIS — C9000 Multiple myeloma not having achieved remission: Secondary | ICD-10-CM | POA: Diagnosis present

## 2011-09-23 LAB — DIFFERENTIAL
Basophils Absolute: 0 10*3/uL (ref 0.0–0.1)
Basophils Relative: 0 % (ref 0–1)
Eosinophils Absolute: 0.2 10*3/uL (ref 0.0–0.7)
Eosinophils Relative: 2 % (ref 0–5)
Lymphocytes Relative: 29 % (ref 12–46)
Lymphs Abs: 2.7 10*3/uL (ref 0.7–4.0)
Monocytes Absolute: 0.8 10*3/uL (ref 0.1–1.0)
Monocytes Relative: 9 % (ref 3–12)
Neutro Abs: 5.6 10*3/uL (ref 1.7–7.7)
Neutrophils Relative %: 60 % (ref 43–77)

## 2011-09-23 LAB — BASIC METABOLIC PANEL
BUN: 92 mg/dL — ABNORMAL HIGH (ref 6–23)
CO2: 27 mEq/L (ref 19–32)
Calcium: 9.2 mg/dL (ref 8.4–10.5)
Chloride: 94 mEq/L — ABNORMAL LOW (ref 96–112)
Creatinine, Ser: 2.67 mg/dL — ABNORMAL HIGH (ref 0.50–1.35)
GFR calc Af Amer: 30 mL/min — ABNORMAL LOW (ref >90)
GFR calc non Af Amer: 26 mL/min — ABNORMAL LOW (ref >90)
Glucose, Bld: 303 mg/dL — ABNORMAL HIGH (ref 70–99)
Potassium: 4.5 mEq/L (ref 3.5–5.1)
Sodium: 132 mEq/L — ABNORMAL LOW (ref 135–145)

## 2011-09-23 LAB — HEPATIC FUNCTION PANEL
ALT: 23 U/L (ref 0–53)
AST: 19 U/L (ref 0–37)
Albumin: 3.4 g/dL — ABNORMAL LOW (ref 3.5–5.2)
Alkaline Phosphatase: 75 U/L (ref 39–117)
Bilirubin, Direct: <0.1 mg/dL (ref 0.0–0.3)
Total Bilirubin: 0.3 mg/dL (ref 0.3–1.2)
Total Protein: 6.3 g/dL (ref 6.0–8.3)

## 2011-09-23 LAB — CBC
HCT: 37 % — ABNORMAL LOW (ref 39.0–52.0)
Hemoglobin: 12.5 g/dL — ABNORMAL LOW (ref 13.0–17.0)
MCH: 29.5 pg (ref 26.0–34.0)
MCHC: 33.8 g/dL (ref 30.0–36.0)
MCV: 87.3 fL (ref 78.0–100.0)
Platelets: 194 10*3/uL (ref 150–400)
RBC: 4.24 MIL/uL (ref 4.22–5.81)
RDW: 14.3 % (ref 11.5–15.5)
WBC: 9.3 10*3/uL (ref 4.0–10.5)

## 2011-09-23 LAB — LACTIC ACID, PLASMA: Lactic Acid, Venous: 1 mmol/L (ref 0.5–2.2)

## 2011-09-23 MED ORDER — ACETAMINOPHEN 325 MG PO TABS: 650.0000 mg | ORAL_TABLET | Freq: Four times a day (QID) | ORAL | Status: DC | PRN

## 2011-09-23 MED ORDER — ONDANSETRON HCL 4 MG/2ML IJ SOLN: 4.0000 mg | Freq: Four times a day (QID) | INTRAMUSCULAR | Status: DC | PRN

## 2011-09-23 MED ORDER — INSULIN GLARGINE 100 UNIT/ML ~~LOC~~ SOLN
55.0000 [IU] | Freq: Every day | SUBCUTANEOUS | Status: DC
  Administered 2011-09-24 – 2011-09-25 (×2): 55 [IU] via SUBCUTANEOUS

## 2011-09-23 MED ORDER — ONDANSETRON HCL 4 MG/2ML IJ SOLN
4.0000 mg | Freq: Once | INTRAMUSCULAR | Status: AC
  Administered 2011-09-23: 4 mg via INTRAVENOUS
  Filled 2011-09-23: qty 2

## 2011-09-23 MED ORDER — ATORVASTATIN CALCIUM 20 MG PO TABS
20.0000 mg | ORAL_TABLET | Freq: Every day | ORAL | Status: DC
  Administered 2011-09-24: 20 mg via ORAL
  Filled 2011-09-23: qty 1

## 2011-09-23 MED ORDER — SODIUM CHLORIDE 0.9 % IV BOLUS (SEPSIS): 1000.0000 mL | Freq: Once | INTRAVENOUS | Status: DC

## 2011-09-23 MED ORDER — INSULIN ASPART 100 UNIT/ML ~~LOC~~ SOLN
0.0000 [IU] | Freq: Every day | SUBCUTANEOUS | Status: DC
  Administered 2011-09-24: 3 [IU] via SUBCUTANEOUS

## 2011-09-23 MED ORDER — SODIUM CHLORIDE 0.9 % IV SOLN
INTRAVENOUS | Status: DC
  Administered 2011-09-23 – 2011-09-25 (×3): via INTRAVENOUS

## 2011-09-23 MED ORDER — LORATADINE 10 MG PO TABS
10.0000 mg | ORAL_TABLET | ORAL | Status: DC
  Administered 2011-09-24 – 2011-09-25 (×2): 10 mg via ORAL
  Filled 2011-09-23 (×2): qty 1

## 2011-09-23 MED ORDER — ONDANSETRON HCL 4 MG PO TABS: 4.0000 mg | ORAL_TABLET | ORAL | Status: DC | PRN

## 2011-09-23 MED ORDER — OXYCODONE HCL 5 MG PO TABS: 5.0000 mg | ORAL_TABLET | ORAL | Status: DC | PRN

## 2011-09-23 MED ORDER — HYDROMORPHONE HCL PF 1 MG/ML IJ SOLN: 0.5000 mg | INTRAMUSCULAR | Status: DC | PRN

## 2011-09-23 MED ORDER — ACETAMINOPHEN 650 MG RE SUPP: 650.0000 mg | Freq: Four times a day (QID) | RECTAL | Status: DC | PRN

## 2011-09-23 MED ORDER — ASPIRIN EC 325 MG PO TBEC
325.0000 mg | DELAYED_RELEASE_TABLET | ORAL | Status: DC
  Administered 2011-09-24 – 2011-09-25 (×2): 325 mg via ORAL
  Filled 2011-09-23 (×2): qty 1

## 2011-09-23 MED ORDER — SODIUM CHLORIDE 0.9 % IV BOLUS (SEPSIS)
500.0000 mL | Freq: Once | INTRAVENOUS | Status: AC
  Administered 2011-09-23: 500 mL via INTRAVENOUS

## 2011-09-23 MED ORDER — ENOXAPARIN SODIUM 30 MG/0.3ML ~~LOC~~ SOLN
30.0000 mg | SUBCUTANEOUS | Status: DC
  Administered 2011-09-24: 30 mg via SUBCUTANEOUS
  Filled 2011-09-23: qty 0.3

## 2011-09-23 MED ORDER — ACYCLOVIR 800 MG PO TABS
400.0000 mg | ORAL_TABLET | ORAL | Status: DC
  Administered 2011-09-24 – 2011-09-25 (×2): 400 mg via ORAL
  Filled 2011-09-23 (×2): qty 1

## 2011-09-23 MED ORDER — PANTOPRAZOLE SODIUM 40 MG PO TBEC
80.0000 mg | DELAYED_RELEASE_TABLET | Freq: Two times a day (BID) | ORAL | Status: DC
  Administered 2011-09-24 – 2011-09-25 (×3): 80 mg via ORAL
  Filled 2011-09-23: qty 1
  Filled 2011-09-23 (×2): qty 2

## 2011-09-23 MED ORDER — INSULIN ASPART 100 UNIT/ML ~~LOC~~ SOLN
0.0000 [IU] | Freq: Three times a day (TID) | SUBCUTANEOUS | Status: DC
  Administered 2011-09-24: 7 [IU] via SUBCUTANEOUS
  Administered 2011-09-24: 5 [IU] via SUBCUTANEOUS
  Administered 2011-09-24: 3 [IU] via SUBCUTANEOUS
  Administered 2011-09-24: 2 [IU] via SUBCUTANEOUS
  Administered 2011-09-25: 3 [IU] via SUBCUTANEOUS

## 2011-09-23 MED ORDER — POTASSIUM CHLORIDE CRYS ER 10 MEQ PO TBCR
10.0000 meq | EXTENDED_RELEASE_TABLET | ORAL | Status: DC
  Administered 2011-09-24 – 2011-09-25 (×2): 10 meq via ORAL
  Filled 2011-09-23 (×2): qty 1

## 2011-09-23 NOTE — ED Provider Notes (Signed)
History     CSN: [id number]  Arrival date & time 09/23/11  [id number]   First MD Initiated Contact with Patient 09/23/11 1952      Chief Complaint  Patient presents with  . Fatigue    Patient is a 51 y.o. male presenting with diarrhea. The history is provided by the patient and a relative.  Diarrhea The primary symptoms include fatigue, abdominal pain, nausea and diarrhea. Primary symptoms do not include fever or vomiting. The illness began 3 to 5 days ago. The onset was gradual. The problem has been gradually worsening.  The illness is also significant for chills. Risk factors: h/o multiple myeloma.  nothing improves his symptoms Nothing worsens his symptoms  Pt reports diarrhea for past 5 days The stool is nonbloody He also reports abdominal cramping He reports fatigue He reports he saw his PCP who sent him in for evaluation No cp/sob No cough reported  Past Medical History  Diagnosis Date  . Arteriosclerotic cardiovascular disease (ASCVD)     MI-2000s; stent to the proximal LAD and diagonal in 2001; stress nuclear in 2008-impaired exercise capacity, left ventricular dilatation, moderately to severely depressed EF, apical, inferior and anteroseptal scar  . Sleep apnea   . Hypertension   . 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 Multiple myeloma 07/01/2011  . Diabetes mellitus     Insulin  . GERD (gastroesophageal reflux disease)   . Pedal edema     Venous insufficiency  . Obesity   . Hyperlipidemia   . Chronic kidney disease, stage 3, mod decreased GFR     Creatinine of 1.84 in 06/2011 and 1.5 in 07/2011  . Injection Site Reaction     Past Surgical History  Procedure Date  . Laparoscopic gastric banding 2006  . Wrist surgery     Left; removal of bone fragment  . Incision and drainage abscess anal   . Abscess drainage     Scrotal  . Bone marrow biopsy 05/13/11  . Portacath placement 07/07/2011    Procedure: INSERTION PORT-A-CATH;  Surgeon: Scherry Ran, MD;   Location: [facility];  Service: General;  Laterality: N/A;    Family History  Problem Relation Age of Onset  . Heart disease Mother   . Cancer Mother   . Diabetes Father   . Anesthesia problems Neg Hx   . Hypotension Neg Hx   . Malignant hyperthermia Neg Hx   . Pseudochol deficiency Neg Hx     History  Substance Use Topics  . Smoking status: Former Smoker    Types: Cigarettes, Cigars    Quit date: 05/17/2001  . Smokeless tobacco: Never Used  . Alcohol Use: No      Review of Systems  Constitutional: Positive for chills and fatigue. Negative for fever.  Gastrointestinal: Positive for nausea, abdominal pain and diarrhea. Negative for vomiting.  All other systems reviewed and are negative.    Allergies  Review of patient's allergies indicates no known allergies.  Home Medications   Current Outpatient Rx  Name Route Sig Dispense Refill  . ACYCLOVIR 400 MG PO TABS Oral Take 400 mg by mouth daily. To start Jul 07, 2011 (to take daily while on chemo)    . ASPIRIN EC 325 MG PO TBEC Oral Take 325 mg by mouth daily.    Marland Kitchen BENZONATATE PO Oral Take 1 tablet by mouth every 12 (twelve) hours as needed.    Marland Kitchen BORTEZOMIB CHEMO IV INJECTION 3.5 MG Intravenous Inject 1.3 mg/m2 into the vein every 21 (  twenty-one) days. To start Jul 11, 2011    . CARVEDILOL 25 MG PO TABS Oral Take 12.5 mg by mouth 2 (two) times daily with a meal.     . NOREL AD PO Oral Take 1 tablet by mouth every 12 (twelve) hours.    Marland Kitchen HYDROCOD POLST-CPM POLST ER 10-8 MG/5ML PO LQCR Oral Take 5 mLs by mouth every 12 (twelve) hours. 140 mL 1  . VITAMIN D 2000 UNITS PO CAPS Oral Take 1 capsule by mouth daily.     Marland Kitchen CLINDAMYCIN PHOSPHATE 1 % EX GEL Topical Apply topically 2 (two) times daily. 30 g 0  . DEXAMETHASONE 4 MG PO TABS  Take 5 decadron tablets BID every Friday! Start Friday Feb 8th. 80 tablet 4  . DEXLANSOPRAZOLE 60 MG PO CPDR Oral Take 60 mg by mouth every morning.     Marland Kitchen FIRST-DUKES MOUTHWASH MT SUSP Mouth/Throat Use  as directed 5 mLs in the mouth or throat 4 (four) times daily as needed. 240 mL 1  . FLUTICASONE PROPIONATE 50 MCG/ACT NA SUSP  Two sprays into both nostrils daily for 14 days. 1 g 0  . HYDROCODONE-ACETAMINOPHEN 5-325 MG PO TABS Oral Take 1 tablet by mouth every 6 (six) hours as needed for pain. 30 tablet 0  . INSULIN GLARGINE 100 UNIT/ML [place] SOLN Subcutaneous Inject 20-54 Units into the skin every morning. SLIDING SCALE Under 125 use 20 units Over 125 use 40 units  Over 175 use 54 units    . METANX PO Oral Take 1 tablet by mouth every morning.     Marland Kitchen LENALIDOMIDE 25 MG PO CAPS  Start today for 10 days then off 7 days. With next cycle 14 days on and 7 days off    . LIDOCAINE-PRILOCAINE 2.5-2.5 % EX CREA Topical Apply 1 application topically as needed. To port site. Apply 1 hour prior to treatment. Do not rub in. Cover with plastic.    Marland Kitchen LIRAGLUTIDE 18 MG/3ML Protivin SOLN Subcutaneous Inject 1.2 mg into the skin every evening.     Marland Kitchen LORATADINE 10 MG PO TABS Oral Take 10 mg by mouth every morning.     Marland Kitchen LORAZEPAM 1 MG PO TABS Oral Take 1 mg by mouth every 4 (four) hours as needed. Nausea/vomiting    . METOCLOPRAMIDE HCL 10 MG PO TABS Oral Take 10 mg by mouth 4 (four) times daily.     [place] M PLUS PO TABS Oral Take 1 tablet by mouth every morning.      Marland Kitchen ONDANSETRON HCL 8 MG PO TABS Oral Take 8 mg by mouth 2 (two) times daily as needed. Nausea/vomiting. Patient to also take this 30 minutes to 1 hour prior to Velcade injections.    Marland Kitchen POTASSIUM CHLORIDE CRYS ER 10 MEQ PO TBCR Oral Take 10 mEq by mouth daily.    Marland Kitchen PROCHLORPERAZINE MALEATE 10 MG PO TABS Oral Take 10 mg by mouth every 6 (six) hours as needed. For nausea/vomiting    . RAMIPRIL 2.5 MG PO TABS Oral Take 2.5 mg by mouth every morning.     Marland Kitchen ROSUVASTATIN CALCIUM 10 MG PO TABS Oral Take 10 mg by mouth every morning.     Marland Kitchen SPIRONOLACTONE 25 MG PO TABS Oral Take 1 tablet (25 mg total) by mouth 2 (two) times daily. 15 tablet 0  .  SULFAMETHOXAZOLE-TRIMETHOPRIM 800-160 MG PO TABS Oral Take 1 tablet by mouth 3 (three) times a week. To start Jul 07, 2011 (to take MWF while  on chemo)    . CIALIS PO Oral Take by mouth as needed.      . TORSEMIDE 20 MG PO TABS Oral Take 40 mg by mouth daily.     Marland Kitchen ZOMETA IV Intravenous Inject 2 mg into the vein every 28 (twenty-eight) days. To start Jul 11, 2011      BP 111/60  Pulse 67  Temp 97.6 F (36.4 C)  Resp 20  Ht 6\' 3"  (1.905 m)  Wt 377 lb (171.006 kg)  BMI 47.12 kg/m2  SpO2 96%  Physical Exam CONSTITUTIONAL: Well developed/well nourished HEAD AND FACE: Normocephalic/atraumatic EYES: EOMI/PERRL ENMT: Mucous membranes dry NECK: supple no meningeal signs SPINE:entire spine nontender CV: S1/S2 noted, no murmurs/rubs/gallops noted LUNGS: Lungs are clear to auscultation bilaterally, no apparent distress ABDOMEN: soft, obese nontender, no rebound or guarding GU:no cva tenderness NEURO: Pt is awake/alert, moves all extremitiesx4 EXTREMITIES: pulses normal, full ROM SKIN: warm, color normal PSYCH: no abnormalities of mood noted  ED Course  Procedures    Labs Reviewed  CBC  DIFFERENTIAL  BASIC METABOLIC PANEL  LACTIC ACID, PLASMA    Pt with some improvement in his BP, however worsening renal failure, and also has h/o CHF Pt will need gentle rehydration and monitoring in the hospital.  He may also need to have his med list changed due to renal dysfunction.  Concern for worsening dehydration/renal failure at home will admit.  Also concerned as patient was significant orthostatic at his PCP's office  I spoke to on call triad dr Megan Salon who will admit to [place] notes reviewed and considered in documentation Previous records reviewed and considered All labs/vitals reviewed and considered I spoke to his PCP about dehydration, orthostatic in office down to SBP 79 per dr hill     Date: 09/23/2011  Rate: 69  Rhythm: normal sinus rhythm  QRS Axis: left   Intervals: normal  ST/T Wave abnormalities: nonspecific ST changes  Conduction Disutrbances:none  Narrative Interpretation:   Old EKG Reviewed: unchanged    Sharyon Cable, MD 09/23/11 2211

## 2011-09-23 NOTE — Progress Notes (Signed)
To review tx plan 09/22/2011 was actually day 4.

## 2011-09-23 NOTE — ED Notes (Signed)
Seen by  Dr [name] earlier today, c/o low blood pressure and weakness

## 2011-09-23 NOTE — H&P (Addendum)
PCP:   [name], MD, MD   Oncologist: Everardo All M.D.  Chief Complaint:  Weakness and dizziness since today  HPI: [name] is an 51 y.o. male.   Morbidly obese African American gentleman with multiple medical problems including multiple myeloma on [name] and Velcade chemotherapy. Patient reports she's been having episodic diarrhea since starting chemotherapy in February, and has intermittently had tablet interrupted. Restarted chemotherapy yesterday, and has had sudden worsening of his diarrhea. Whose primary care physician today and was found to be a potential of an orthostatic, and advised to come to the emergency room for hydration.  Reports stool is copious watery and yellow. Denies fever.   although morbidly obese as lost about 100 pounds over the past 12 months   Baseline creatinine is about 1.6, current creatinine 2.67  Rewiew of Systems:  The patient denies anorexia, fever, weight loss,, vision loss, decreased hearing, hoarseness, chest pain, syncope, dyspnea on exertion, peripheral edema, balance deficits, hemoptysis, abdominal pain, melena, hematochezia, severe indigestion/heartburn, hematuria, incontinence, genital sores, muscle weakness, suspicious skin lesions, transient blindness, difficulty walking, depression, unusual weight change, abnormal bleeding, enlarged lymph nodes, angioedema, and breast masses.   Past Medical History  Diagnosis Date  . Arteriosclerotic cardiovascular disease (ASCVD)     MI-2000s; stent to the proximal LAD and diagonal in 2001; stress nuclear in 2008-impaired exercise capacity, left ventricular dilatation, moderately to severely depressed EF, apical, inferior and anteroseptal scar  . Sleep apnea   . Hypertension   . 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 Multiple myeloma 07/01/2011  . Diabetes mellitus     Insulin  . GERD (gastroesophageal reflux disease)   . Pedal edema     Venous insufficiency  . Obesity   .  Hyperlipidemia   . Chronic kidney disease, stage 3, mod decreased GFR     Creatinine of 1.84 in 06/2011 and 1.5 in 07/2011  . Injection Site Reaction     Past Surgical History  Procedure Date  . Laparoscopic gastric banding 2006  . Wrist surgery     Left; removal of bone fragment  . Incision and drainage abscess anal   . Abscess drainage     Scrotal  . Bone marrow biopsy 05/13/11  . Portacath placement 07/07/2011    Procedure: INSERTION PORT-A-CATH;  Surgeon: Scherry Ran, MD;  Location: [facility];  Service: General;  Laterality: N/A;    Medications:  HOME MEDS: Prior to Admission medications   Medication Sig Start Date End Date Taking? Authorizing Provider  acyclovir (ZOVIRAX) 400 MG tablet Take 400 mg by mouth every morning. To start Jul 07, 2011 (to take daily while on chemo)   Yes Historical Provider, MD  aspirin EC 325 MG tablet Take 325 mg by mouth every morning.    Yes Historical Provider, MD  bortezomib IV (VELCADE) 3.5 MG injection Inject 1.3 mg/m2 into the vein every 21 ( twenty-one) days. To start Jul 11, 2011   Yes Historical Provider, MD  carvedilol (COREG) 25 MG tablet Take 12.5 mg by mouth 2 (two) times daily with a meal.    Yes Historical Provider, MD  chlorpheniramine-HYDROcodone (TUSSIONEX) 10-8 MG/5ML LQCR Take 5 mLs by mouth every 12 (twelve) hours. 09/08/11  Yes Pieter Partridge, MD  Cholecalciferol (VITAMIN D) 2000 UNITS CAPS Take 1 capsule by mouth every morning.    Yes Historical Provider, MD  clindamycin (CLINDAGEL) 1 % gel Apply topically 2 (two) times daily. 08/28/11 08/27/12 Yes Baird Cancer, PA  dexamethasone (DECADRON) 4  MG tablet Take 5 decadron tablets BID every Friday! Start Friday Feb 8th. 07/12/11  Yes Pieter Partridge, MD  dexlansoprazole (DEXILANT) 60 MG capsule Take 60 mg by mouth every morning.    Yes Historical Provider, MD  insulin glargine (LANTUS) 100 UNIT/ML injection Inject 20-54 Units into the skin every morning. SLIDING SCALE Under 125 use  20 units Over 125 use 40 units  Over 175 use 54 units   Yes Historical Provider, MD  L-Methylfolate-B6-B12 (METANX PO) Take 1 tablet by mouth every morning.    Yes Historical Provider, MD  lenalidomide ([name]) 25 MG capsule Take 25 mg by mouth See admin instructions. Take one tablet daily every day for 14 days of each month, then STOP for 7 days, then repeat. 09/22/11  Yes Baird Cancer, PA  lidocaine-prilocaine (EMLA) cream Apply 1 application topically as needed. To port site. Apply 1 hour prior to treatment. Do not rub in. Cover with plastic.   Yes Historical Provider, MD  Liraglutide (VICTOZA) 18 MG/3ML SOLN Inject 1.2 mg into the skin every evening.    Yes Historical Provider, MD  loratadine (CLARITIN) 10 MG tablet Take 10 mg by mouth every morning.    Yes Historical Provider, MD  Multiple Vitamins-Minerals (MULTIVITAMINS THER. W/MINERALS) TABS Take 1 tablet by mouth every morning.     Yes Historical Provider, MD  ondansetron (ZOFRAN) 8 MG tablet Take 8 mg by mouth 2 (two) times daily as needed. Nausea/vomiting. Patient to also take this 30 minutes to 1 hour prior to Velcade injections.   Yes Historical Provider, MD  potassium chloride (K-DUR,KLOR-CON) 10 MEQ tablet Take 10 mEq by mouth every morning.    Yes Historical Provider, MD  ramipril (ALTACE) 2.5 MG tablet Take 2.5 mg by mouth every morning.    Yes Historical Provider, MD  rosuvastatin (CRESTOR) 10 MG tablet Take 10 mg by mouth every morning.    Yes Historical Provider, MD  sulfamethoxazole-trimethoprim (BACTRIM DS,SEPTRA DS) 800-160 MG per tablet Take 1 tablet by mouth 3 (three) times a week. To start Jul 07, 2011 (to take MWF while on chemo)   Yes Historical Provider, MD  torsemide (DEMADEX) 20 MG tablet Take 40 mg by mouth every morning.    Yes Historical Provider, MD  Zoledronic Acid (ZOMETA IV) Inject 2 mg into the vein every 28 (twenty-eight) days. To start Jul 11, 2011   Yes Historical Provider, MD  Diphenhyd-Hydrocort-Nystatin  (FIRST-DUKES MOUTHWASH) SUSP Use as directed 5 mLs in the mouth or throat 4 (four) times daily as needed. 08/14/11   Baird Cancer, PA  HYDROcodone-acetaminophen (NORCO) 5-325 MG per tablet Take 1 tablet by mouth every 6 (six) hours as needed for pain. 09/12/11 09/22/11  Baird Cancer, PA  LORazepam (ATIVAN) 1 MG tablet Take 1 mg by mouth every 4 (four) hours as needed. Nausea/vomiting    Historical Provider, MD  Tadalafil (CIALIS PO) Take by mouth as needed.      Historical Provider, MD     Allergies:  No Known Allergies  Social History:   reports that he quit smoking about 10 years ago. His smoking use included Cigarettes and Cigars. He has never used smokeless tobacco. He reports that he does not drink alcohol or use illicit drugs.  Family History: Family History  Problem Relation Age of Onset  . Heart disease Mother   . Cancer Mother   . Diabetes Father   . Anesthesia problems Neg Hx   . Hypotension Neg Hx   .  Malignant hyperthermia Neg Hx   . Pseudochol deficiency Neg Hx      Physical Exam: Filed Vitals:   09/23/11 2011 09/23/11 2200 09/23/11 2252  BP: 111/60 113/70 109/62  Pulse: 67 69 64  Temp: 97.6 F (36.4 C)  98.8 F (37.1 C)  Resp: 20 19 18   Height: 6\' 3"  (1.905 m)    Weight: 171.006 kg (377 lb)    SpO2: 96% 98% 97%   Blood pressure 109/62, pulse 64, temperature 98.8 F (37.1 C), resp. rate 18, height 6\' 3"  (1.905 m), weight 171.006 kg (377 lb), SpO2 97.00%.  GEN:  Pleasant 78 obese African American lying in the stretcher in no acute distress; cooperative with exam PSYCH:  alert and oriented x4;  affect is appropriate. HEENT: Mucous membranes pink, dry, and anicteric; PERRLA; EOM intact; thick neck. Breasts:: Not examined CHEST WALL: No tenderness CHEST: Normal respiration, clear to auscultation bilaterally HEART: Regular rate and rhythm; no murmurs rubs or gallops ABDOMEN: Obese, soft non-tender;  normal abdominal bowel sounds; no pannus; no  intertriginous candida. Rectal Exam: Not done EXTREMITIES: 2+ edema bilaterally; wearing compression hose;  no ulcerations. Genitalia: not examined PULSES: 2+ and symmetric SKIN:n no rash or ulceration CNS: Cranial nerves 2-12 grossly intact no focal lateralizing neurologic deficit   Labs & Imaging Results for orders placed during the hospital encounter of 09/23/11 (from the past 48 hour(s))  CBC     Status: Abnormal   Collection Time   09/23/11  7:59 PM      Component Value Range Comment   WBC 9.3  4.0 - 10.5 (K/uL)    RBC 4.24  4.22 - 5.81 (MIL/uL)    Hemoglobin 12.5 (*) 13.0 - 17.0 (g/dL)    HCT 37.0 (*) 39.0 - 52.0 (%)    MCV 87.3  78.0 - 100.0 (fL)    MCH 29.5  26.0 - 34.0 (pg)    MCHC 33.8  30.0 - 36.0 (g/dL)    RDW 14.3  11.5 - 15.5 (%)    Platelets 194  150 - 400 (K/uL)   DIFFERENTIAL     Status: Normal   Collection Time   09/23/11  7:59 PM      Component Value Range Comment   Neutrophils Relative 60  43 - 77 (%)    Neutro Abs 5.6  1.7 - 7.7 (K/uL)    Lymphocytes Relative 29  12 - 46 (%)    Lymphs Abs 2.7  0.7 - 4.0 (K/uL)    Monocytes Relative 9  3 - 12 (%)    Monocytes Absolute 0.8  0.1 - 1.0 (K/uL)    Eosinophils Relative 2  0 - 5 (%)    Eosinophils Absolute 0.2  0.0 - 0.7 (K/uL)    Basophils Relative 0  0 - 1 (%)    Basophils Absolute 0.0  0.0 - 0.1 (K/uL)   BASIC METABOLIC PANEL     Status: Abnormal   Collection Time   09/23/11  7:59 PM      Component Value Range Comment   Sodium 132 (*) 135 - 145 (mEq/L)    Potassium 4.5  3.5 - 5.1 (mEq/L)    Chloride 94 (*) 96 - 112 (mEq/L)    CO2 27  19 - 32 (mEq/L)    Glucose, Bld 303 (*) 70 - 99 (mg/dL)    BUN 92 (*) 6 - 23 (mg/dL)    Creatinine, Ser 2.67 (*) 0.50 - 1.35 (mg/dL)    Calcium 9.2  8.4 -  10.5 (mg/dL)    GFR calc non Af Amer 26 (*) >90 (mL/min)    GFR calc Af Amer 30 (*) >90 (mL/min)   LACTIC ACID, PLASMA     Status: Normal   Collection Time   09/23/11  8:49 PM      Component Value Range Comment    Lactic Acid, Venous 1.0  0.5 - 2.2 (mmol/L)   HEPATIC FUNCTION PANEL     Status: Abnormal (Preliminary result)   Collection Time   09/23/11 10:28 PM      Component Value Range Comment   Total Protein 6.3  6.0 - 8.3 (g/dL)    Albumin 3.4 (*) 3.5 - 5.2 (g/dL)    AST 19  0 - 37 (U/L)    ALT 23  0 - 53 (U/L)    Alkaline Phosphatase 75  39 - 117 (U/L)    Total Bilirubin 0.3  0.3 - 1.2 (mg/dL)    Bilirubin, Direct PENDING  0.0 - 0.3 (mg/dL)    Indirect Bilirubin PENDING  0.3 - 0.9 (mg/dL)    No results found.    Assessment Present on Admission:   .Diarrhea - likely medication side effect, possibly C. difficile .Orthostasis, due to dehydration  .Acute on chronic renal failure, due to dehydration   .Morbid obesity .SLEEP APNEA .Multiple myeloma .Arteriosclerotic cardiovascular disease (ASCVD) .Hypertension .Hyperlipidemia  .DIABETES MELLITUS, TYPE II, ON INSULIN . chronic Edema of both legs   PLAN: we'll bring this gentleman on observation for hydration, and C. difficile evaluation. We'll temporarily hold his diuretics and antihypertensives  He is unsure of his dose of Decadron, will need to continue this  Other plans as per orders.   Tarika Mckethan 09/23/2011, 11:02 PM

## 2011-09-24 ENCOUNTER — Encounter (HOSPITAL_COMMUNITY): Payer: Self-pay | Admitting: *Deleted

## 2011-09-24 LAB — HEMOGLOBIN A1C
Hgb A1c MFr Bld: 9.2 % — ABNORMAL HIGH (ref <5.7)
Mean Plasma Glucose: 217 mg/dL — ABNORMAL HIGH (ref <117)

## 2011-09-24 LAB — BASIC METABOLIC PANEL
BUN: 74 mg/dL — ABNORMAL HIGH (ref 6–23)
CO2: 24 mEq/L (ref 19–32)
Calcium: 7.8 mg/dL — ABNORMAL LOW (ref 8.4–10.5)
Chloride: 102 mEq/L (ref 96–112)
Creatinine, Ser: 2.06 mg/dL — ABNORMAL HIGH (ref 0.50–1.35)
GFR calc Af Amer: 41 mL/min — ABNORMAL LOW (ref >90)
GFR calc non Af Amer: 35 mL/min — ABNORMAL LOW (ref >90)
Glucose, Bld: 203 mg/dL — ABNORMAL HIGH (ref 70–99)
Potassium: 3.7 mEq/L (ref 3.5–5.1)
Sodium: 135 mEq/L (ref 135–145)

## 2011-09-24 LAB — URINALYSIS, ROUTINE W REFLEX MICROSCOPIC
Bilirubin Urine: NEGATIVE
Glucose, UA: 100 mg/dL — AB
Ketones, ur: NEGATIVE mg/dL
Leukocytes, UA: NEGATIVE
Nitrite: NEGATIVE
Protein, ur: 30 mg/dL — AB
Specific Gravity, Urine: 1.01 (ref 1.005–1.030)
Urobilinogen, UA: 0.2 mg/dL (ref 0.0–1.0)
pH: 5.5 (ref 5.0–8.0)

## 2011-09-24 LAB — GLUCOSE, CAPILLARY
Glucose-Capillary: 193 mg/dL — ABNORMAL HIGH (ref 70–99)
Glucose-Capillary: 243 mg/dL — ABNORMAL HIGH (ref 70–99)
Glucose-Capillary: 258 mg/dL — ABNORMAL HIGH (ref 70–99)
Glucose-Capillary: 291 mg/dL — ABNORMAL HIGH (ref 70–99)
Glucose-Capillary: 304 mg/dL — ABNORMAL HIGH (ref 70–99)

## 2011-09-24 LAB — CBC
HCT: 32.6 % — ABNORMAL LOW (ref 39.0–52.0)
Hemoglobin: 10.9 g/dL — ABNORMAL LOW (ref 13.0–17.0)
MCH: 29.6 pg (ref 26.0–34.0)
MCHC: 33.4 g/dL (ref 30.0–36.0)
MCV: 88.6 fL (ref 78.0–100.0)
Platelets: 163 10*3/uL (ref 150–400)
RBC: 3.68 MIL/uL — ABNORMAL LOW (ref 4.22–5.81)
RDW: 14.4 % (ref 11.5–15.5)
WBC: 9.1 10*3/uL (ref 4.0–10.5)

## 2011-09-24 LAB — TSH: TSH: 0.822 u[IU]/mL (ref 0.350–4.500)

## 2011-09-24 LAB — URINE MICROSCOPIC-ADD ON

## 2011-09-24 LAB — MRSA PCR SCREENING: MRSA by PCR: NEGATIVE

## 2011-09-24 LAB — MAGNESIUM: Magnesium: 2.1 mg/dL (ref 1.5–2.5)

## 2011-09-24 LAB — T4, FREE: Free T4: 1.29 ng/dL (ref 0.80–1.80)

## 2011-09-24 MED ORDER — HYDROCOD POLST-CHLORPHEN POLST 10-8 MG/5ML PO LQCR: 5.0000 mL | Freq: Two times a day (BID) | ORAL | Status: DC | PRN

## 2011-09-24 MED ORDER — HYDROCODONE-ACETAMINOPHEN 5-325 MG PO TABS: 1.0000 | ORAL_TABLET | Freq: Four times a day (QID) | ORAL | Status: DC | PRN

## 2011-09-24 MED ORDER — LENALIDOMIDE 25 MG PO CAPS
25.0000 mg | ORAL_CAPSULE | Freq: Every day | ORAL | Status: DC
  Administered 2011-09-24 – 2011-09-25 (×2): 25 mg via ORAL

## 2011-09-24 MED ORDER — SULFAMETHOXAZOLE-TMP DS 800-160 MG PO TABS
1.0000 | ORAL_TABLET | ORAL | Status: DC
  Administered 2011-09-24: 1 via ORAL
  Filled 2011-09-24: qty 1

## 2011-09-24 MED ORDER — L-METHYLFOLATE-B6-B12 3-35-2 MG PO TABS
1.0000 | ORAL_TABLET | Freq: Every day | ORAL | Status: DC
  Administered 2011-09-24 – 2011-09-25 (×2): 1 via ORAL
  Filled 2011-09-24 (×3): qty 1

## 2011-09-24 MED ORDER — LORAZEPAM 0.5 MG PO TABS: 1.0000 mg | ORAL_TABLET | ORAL | Status: DC | PRN

## 2011-09-24 MED ORDER — LENALIDOMIDE 25 MG PO CAPS: 25.0000 mg | ORAL_CAPSULE | Freq: Every day | ORAL | Status: DC

## 2011-09-24 MED ORDER — ENOXAPARIN SODIUM 80 MG/0.8ML ~~LOC~~ SOLN
80.0000 mg | Freq: Every day | SUBCUTANEOUS | Status: DC
  Administered 2011-09-24: 80 mg via SUBCUTANEOUS
  Filled 2011-09-24: qty 0.8

## 2011-09-24 MED ORDER — DIPHENOXYLATE-ATROPINE 2.5-0.025 MG PO TABS: 1.0000 | ORAL_TABLET | Freq: Four times a day (QID) | ORAL | Status: DC | PRN

## 2011-09-24 NOTE — Progress Notes (Signed)
Chart reviewed. Discussed with hematology oncology.  Subjective: Stool more formed. Feels stronger, but not yet back to baseline. No shortness of breath.  Objective: Vital signs in last 24 hours: Filed Vitals:   09/24/11 0900 09/24/11 1000 09/24/11 1100 09/24/11 1200  BP: 123/75 118/70 118/72   Pulse: 70 66 66 74  Temp:      TempSrc:      Resp:    15  Height:      Weight:      SpO2: 95% 93% 98% 98%   Weight change:   Intake/Output Summary (Last 24 hours) at 09/24/11 1401 Last data filed at 09/24/11 1200  Gross per 24 hour  Intake 2041.67 ml  Output   1151 ml  Net 890.67 ml   Physical Exam: General: Comfortable. Lungs clear to auscultation bilaterally without wheeze rhonchi or rales Cardiovascular regular rate rhythm without murmurs gallops rubs Abdomen soft nontender nondistended Extremities 1+ edema Lab Results: Basic Metabolic Panel:  Lab [id number] 0511 09/23/11 1959  NA 135 132*  K 3.7 4.5  CL 102 94*  CO2 24 27  GLUCOSE 203* 303*  BUN 74* 92*  CREATININE 2.06* 2.67*  CALCIUM 7.8* 9.2  MG 2.1 --  PHOS -- --   Liver Function Tests:  Lab 09/23/11 2228  AST 19  ALT 23  ALKPHOS 75  BILITOT 0.3  PROT 6.3  ALBUMIN 3.4*   No results found for this basename: LIPASE:2,AMYLASE:2 in the last 168 hours No results found for this basename: AMMONIA:2 in the last 168 hours CBC:  Lab 09/24/11 0511 09/23/11 1959  WBC 9.1 9.3  NEUTROABS -- 5.6  HGB 10.9* 12.5*  HCT 32.6* 37.0*  MCV 88.6 87.3  PLT 163 194   Cardiac Enzymes: No results found for this basename: CKTOTAL:3,CKMB:3,CKMBINDEX:3,TROPONINI:3 in the last 168 hours BNP: No results found for this basename: PROBNP:3 in the last 168 hours D-Dimer: No results found for this basename: DDIMER:2 in the last 168 hours CBG:  Lab 09/24/11 1141 09/24/11 0744 09/24/11 0017  GLUCAP 243* 193* 291*   Hemoglobin A1C: No results found for this basename: HGBA1C in the last 168 hours Fasting Lipid Panel: No  results found for this basename: CHOL,HDL,LDLCALC,TRIG,CHOLHDL,LDLDIRECT in the last 168 hours Thyroid Function Tests: No results found for this basename: TSH,T4TOTAL,FREET4,T3FREE,THYROIDAB in the last 168 hours Coagulation: No results found for this basename: LABPROT:4,INR:4 in the last 168 hours Anemia Panel: No results found for this basename: VITAMINB12,FOLATE,FERRITIN,TIBC,IRON,RETICCTPCT in the last 168 hours Urine Drug Screen: Drugs of Abuse  No results found for this basename: labopia, cocainscrnur, labbenz, amphetmu, thcu, labbarb    Alcohol Level: No results found for this basename: ETH:2 in the last 168 hours Urinalysis:  Lab 09/23/11 2353  COLORURINE YELLOW  LABSPEC 1.010  PHURINE 5.5  GLUCOSEU 100*  HGBUR TRACE*  BILIRUBINUR NEGATIVE  KETONESUR NEGATIVE  PROTEINUR 30*  UROBILINOGEN 0.2  NITRITE NEGATIVE  LEUKOCYTESUR NEGATIVE   Micro Results: Recent Results (from the past 240 hour(s))  MRSA PCR SCREENING     Status: Normal   Collection Time   09/23/11 11:48 PM      Component Value Range Status Comment   MRSA by PCR NEGATIVE  NEGATIVE  Final    Scheduled Meds:   . acyclovir  400 mg Oral BH-q7a  . aspirin EC  325 mg Oral BH-q7a  . atorvastatin  20 mg Oral q1800  . enoxaparin  80 mg Subcutaneous QHS  . insulin aspart  0-5 Units Subcutaneous QHS  .  insulin aspart  0-9 Units Subcutaneous TID WC  . insulin glargine  55 Units Subcutaneous QAC breakfast  . Waller-methylfolate-B6-B12  1 tablet Oral Daily  . lenalidomide  25 mg Oral See admin instructions  . loratadine  10 mg Oral BH-q7a  . ondansetron  4 mg Intravenous Once  . pantoprazole  80 mg Oral BID AC  . potassium chloride  10 mEq Oral BH-q7a  . sodium chloride  500 mL Intravenous Once  . sulfamethoxazole-trimethoprim  1 tablet Oral 3 times weekly  . DISCONTD: enoxaparin  30 mg Subcutaneous Q24H  . DISCONTD: sodium chloride  1,000 mL Intravenous Once   Continuous Infusions:   . sodium chloride 100 mL/hr  at 09/24/11 1200   PRN Meds:.acetaminophen, acetaminophen, chlorpheniramine-HYDROcodone, diphenoxylate-atropine, HYDROcodone-acetaminophen, LORazepam, ondansetron (ZOFRAN) IV, ondansetron, DISCONTD: HYDROmorphone, DISCONTD: oxyCODONE Assessment/Plan: Active Problems:  DIABETES MELLITUS, TYPE II, ON INSULIN  Morbid obesity  SLEEP APNEA  Multiple myeloma  Arteriosclerotic cardiovascular disease (ASCVD)  Hypertension  Hyperlipidemia  Acute on chronic renal failure  Diarrhea  Orthostasis  Edema of both legs  history of systolic dysfunction. Last echocardiogram in February showed an ejection fraction of 35-40%.  Stool has been more formed, but no C. difficile collected yet. Will collect a sample if he has any further diarrhea. We'll give Lomotil as needed. Resume [name] per hematology oncology's recommendations. Watch for fluid overload. Decrease IV to 50 cc an hour. Increase activity as tolerated. Check a hemoglobin A1c. May need to adjust Lantus.   LOS: 1 day   [name] 09/24/2011, 2:01 PM

## 2011-09-24 NOTE — Plan of Care (Signed)
Problem: Consults Goal: General Medical Patient Education See Patient Education Module for specific education.  Outcome: Progressing Pt presented to ED with c/o abdominal pain & diarrhea x 3days & fatigue. Since arrival to [facility] previous S/S have improved with no further diarrhea or abdominal pain. Goal: Skin Care Protocol Initiated - if indicated If consults are not indicated, leave blank or document N/A  Outcome: Not Progressing Pt has BLE cellulitis. Pt wears compression hose during the day Goal: Diabetes Guidelines if Diabetic/Glucose > 140 If diabetic or lab glucose is > 140 mg/dl - Initiate Diabetes/Hyperglycemia Guidelines & Document Interventions  Outcome: Progressing Pt has DM with ACHS RX'd intervention  Problem: Phase I Progression Outcomes Goal: Voiding-avoid urinary catheter unless indicated Outcome: Not Progressing Pt at current time has a foley for ED:[id number]

## 2011-09-24 NOTE — Progress Notes (Signed)
Pharmacy Consult: Lovenox for VTE prophylaxis.  Patient Measurements: Height: 6\' 3"  (190.5 cm) Weight: 376 lb 12.3 oz (170.9 kg) IBW/kg (Calculated) : 84.5   VITALS Filed Vitals:   09/24/11 0600  BP: 114/84  Pulse: 59  Temp:   Resp:     INR Last Three Days: No results found for this basename: INR:3 in the last 72 hours  CBC:    Component Value Date/Time   WBC 9.1 09/24/2011 0511   RBC 3.68* 09/24/2011 0511   HGB 10.9* 09/24/2011 0511   HCT 32.6* 09/24/2011 0511   PLT 163 09/24/2011 0511   MCV 88.6 09/24/2011 0511   MCH 29.6 09/24/2011 0511   MCHC 33.4 09/24/2011 0511   RDW 14.4 09/24/2011 0511   LYMPHSABS 2.7 09/23/2011 1959   MONOABS 0.8 09/23/2011 1959   EOSABS 0.2 09/23/2011 1959   BASOSABS 0.0 09/23/2011 1959    RENAL FUNCTION: Estimated Creatinine Clearance: 70.7 ml/min (by C-G formula based on Cr of 2.06).  Assessment: Dose stable for age, weight, renal function and indication. Dose adjusted for obesity.  Plan: Lovenox 80mg  sq daily. Sign off.  Pricilla Larsson, [facility] 09/24/2011 8:15 AM

## 2011-09-25 ENCOUNTER — Inpatient Hospital Stay (HOSPITAL_BASED_OUTPATIENT_CLINIC_OR_DEPARTMENT_OTHER): Payer: PRIVATE HEALTH INSURANCE | Admitting: Oncology

## 2011-09-25 ENCOUNTER — Inpatient Hospital Stay (HOSPITAL_BASED_OUTPATIENT_CLINIC_OR_DEPARTMENT_OTHER): Payer: PRIVATE HEALTH INSURANCE

## 2011-09-25 ENCOUNTER — Encounter (HOSPITAL_COMMUNITY): Payer: Self-pay | Admitting: Dietician

## 2011-09-25 ENCOUNTER — Encounter (HOSPITAL_COMMUNITY): Payer: Self-pay

## 2011-09-25 DIAGNOSIS — R197 Diarrhea, unspecified: Secondary | ICD-10-CM

## 2011-09-25 DIAGNOSIS — Z5112 Encounter for antineoplastic immunotherapy: Secondary | ICD-10-CM

## 2011-09-25 DIAGNOSIS — C9000 Multiple myeloma not having achieved remission: Secondary | ICD-10-CM

## 2011-09-25 DIAGNOSIS — E860 Dehydration: Secondary | ICD-10-CM | POA: Diagnosis present

## 2011-09-25 LAB — BASIC METABOLIC PANEL
BUN: 60 mg/dL — ABNORMAL HIGH (ref 6–23)
CO2: 27 mEq/L (ref 19–32)
Calcium: 8.5 mg/dL (ref 8.4–10.5)
Chloride: 101 mEq/L (ref 96–112)
Creatinine, Ser: 1.81 mg/dL — ABNORMAL HIGH (ref 0.50–1.35)
GFR calc Af Amer: 48 mL/min — ABNORMAL LOW (ref >90)
GFR calc non Af Amer: 41 mL/min — ABNORMAL LOW (ref >90)
Glucose, Bld: 227 mg/dL — ABNORMAL HIGH (ref 70–99)
Potassium: 4.2 mEq/L (ref 3.5–5.1)
Sodium: 135 mEq/L (ref 135–145)

## 2011-09-25 LAB — GLUCOSE, CAPILLARY
Glucose-Capillary: 217 mg/dL — ABNORMAL HIGH (ref 70–99)
Glucose-Capillary: 300 mg/dL — ABNORMAL HIGH (ref 70–99)

## 2011-09-25 MED ORDER — BORTEZOMIB CHEMO SQ INJECTION 3.5 MG (2.5MG/ML)
1.1700 mg/m2 | Freq: Once | INTRAMUSCULAR | Status: AC
  Administered 2011-09-25: 3 mg via SUBCUTANEOUS
  Filled 2011-09-25: qty 1.2

## 2011-09-25 MED ORDER — ONDANSETRON HCL 4 MG PO TABS
8.0000 mg | ORAL_TABLET | Freq: Once | ORAL | Status: AC
  Administered 2011-09-25: 8 mg via ORAL

## 2011-09-25 MED ORDER — TORSEMIDE 20 MG PO TABS: 40.0000 mg | ORAL_TABLET | Freq: Every day | ORAL | Status: DC | PRN

## 2011-09-25 MED ORDER — ONDANSETRON HCL 4 MG PO TABS
ORAL_TABLET | ORAL | Status: AC
  Filled 2011-09-25: qty 2

## 2011-09-25 MED ORDER — DIPHENOXYLATE-ATROPINE 2.5-0.025 MG PO TABS: 1.0000 | ORAL_TABLET | Freq: Four times a day (QID) | ORAL | Status: AC | PRN

## 2011-09-25 MED ORDER — HEPARIN SOD (PORK) LOCK FLUSH 100 UNIT/ML IV SOLN
500.0000 [IU] | INTRAVENOUS | Status: DC | PRN
  Filled 2011-09-25: qty 5

## 2011-09-25 MED ORDER — INSULIN GLARGINE 100 UNIT/ML ~~LOC~~ SOLN: 60.0000 [IU] | Freq: Every day | SUBCUTANEOUS | Status: DC

## 2011-09-25 NOTE — Progress Notes (Signed)
Pt discharged from hospital today following inpt for dehydration/diarrhea. Received rx for lomotil and seen by [name] PA today and ok for velcade and started back on revlimid today.

## 2011-09-25 NOTE — Discharge Summary (Addendum)
Physician Discharge Summary  Patient ID: [name] MRN: [id number] DOB/AGE: 1960/11/07 51 y.o.  Admit date: 09/23/2011 Discharge date: 09/25/2011  Discharge Diagnoses:  Principal Problem:  *Orthostasis Active Problems:  Acute on chronic renal failure  Diarrhea  Dehydration  DIABETES MELLITUS, TYPE II, ON INSULIN  Multiple myeloma  Arteriosclerotic cardiovascular disease (ASCVD)  Systolic dysfunction  Morbid obesity  SLEEP APNEA  Hypertension  Hyperlipidemia  Edema of both legs   Medication List  As of 09/25/2011 12:28 PM   STOP taking these medications         carvedilol 25 MG tablet      HYDROcodone-acetaminophen 5-325 MG per tablet      ramipril 2.5 MG tablet         TAKE these medications         acyclovir 400 MG tablet   Commonly known as: ZOVIRAX   Take 400 mg by mouth every morning. To start Jul 07, 2011 (to take daily while on chemo)      aspirin EC 325 MG tablet   Take 325 mg by mouth every morning.      bortezomib IV 3.5 MG injection   Commonly known as: VELCADE   Inject 1.3 mg/m2 into the vein every 21 ( twenty-one) days. To start Jul 11, 2011      chlorpheniramine-HYDROcodone 10-8 MG/5ML Lqcr   Commonly known as: TUSSIONEX   Take 5 mLs by mouth every 12 (twelve) hours.      CIALIS PO   Take by mouth as needed.      clindamycin 1 % gel   Commonly known as: CLINDAGEL   Apply topically 2 (two) times daily.      dexamethasone 4 MG tablet   Commonly known as: DECADRON   Take 5 decadron tablets BID every Friday! Start Friday Feb 8th.      DEXILANT 60 MG capsule   Generic drug: dexlansoprazole   Take 60 mg by mouth every morning.      diphenoxylate-atropine 2.5-0.025 MG per tablet   Commonly known as: LOMOTIL   Take 1 tablet by mouth 4 (four) times daily as needed for diarrhea or loose stools.      FIRST-DUKES MOUTHWASH Susp   Use as directed 5 mLs in the mouth or throat 4 (four) times daily as needed.                  insulin  glargine 100 UNIT/ML injection   Commonly known as: LANTUS   Inject 60 Units into the skin daily before breakfast.      lenalidomide 25 MG capsule   Commonly known as: [name]   Take 25 mg by mouth See admin instructions. Take one tablet daily every day for 14 days of each month, then STOP for 7 days, then repeat.      lidocaine-prilocaine cream   Commonly known as: EMLA   Apply 1 application topically as needed. To port site. Apply 1 hour prior to treatment. Do not rub in. Cover with plastic.      loratadine 10 MG tablet   Commonly known as: CLARITIN   Take 10 mg by mouth every morning.      LORazepam 1 MG tablet   Commonly known as: ATIVAN   Take 1 mg by mouth every 4 (four) hours as needed. Nausea/vomiting      METANX PO   Take 1 tablet by mouth every morning.      multivitamins ther. w/minerals Tabs   Take  1 tablet by mouth every morning.      ondansetron 8 MG tablet   Commonly known as: ZOFRAN   Take 8 mg by mouth 2 (two) times daily as needed. Nausea/vomiting. Patient to also take this 30 minutes to 1 hour prior to Velcade injections.      potassium chloride 10 MEQ tablet   Commonly known as: K-DUR,KLOR-CON   Take 10 mEq by mouth every morning.      rosuvastatin 10 MG tablet   Commonly known as: CRESTOR   Take 10 mg by mouth every morning.      sulfamethoxazole-trimethoprim 800-160 MG per tablet   Commonly known as: BACTRIM DS,SEPTRA DS   Take 1 tablet by mouth 3 (three) times a week. To start Jul 07, 2011 (to take MWF while on chemo)      torsemide 20 MG tablet   Commonly known as: DEMADEX   Take 2 tablets (40 mg total) by mouth daily as needed (swelling or shortness of breath).      VICTOZA 18 MG/3ML Soln   Generic drug: Liraglutide   Inject 1.2 mg into the skin every evening.      Vitamin D 2000 UNITS Caps   Take 1 capsule by mouth every morning.      ZOMETA IV   Inject 2 mg into the vein every 28 (twenty-eight) days. To start Jul 11, 2011             Discharge Orders    Future Appointments: Provider: Department: Dept Phone: Center:   09/25/2011 2:15 PM [place] [phone] None   09/25/2011 3:30 PM [facility] [place] [phone] None   09/29/2011 2:15 PM [place] [phone] None   10/03/2011 2:00 PM [place] [phone] None   10/06/2011 2:15 PM [place] [phone] None   10/09/2011 2:15 PM [place] [phone] None   10/13/2011 2:15 PM [place] [phone] None   10/16/2011 2:15 PM [place] [phone] None     Future Orders Please Complete By Expires   Diet - low sodium heart healthy      Diet Carb Modified      Activity as tolerated - No restrictions      Call MD for:      Comments:   Shortness of breath or dizziness      Follow-up Information    Follow up with HILL,GERALD K, MD in 1 week. (to check blood pressure and resume meds if appropriate)    Contact information:   [place] [phone]       Follow up with [facility] (today for injection)    Contact information:   [address] [place] [phone]          Disposition: 01-Home or Self Care  Discharged Condition: stable  Consults:  none  Labs:   Results for orders placed during the hospital encounter of 09/23/11 (from the past 48 hour(s))  CBC     Status: Abnormal   Collection Time   09/23/11  7:59 PM      Component Value Range Comment   WBC 9.3  4.0 - 10.5 (K/uL)    RBC 4.24  4.22 - 5.81 (MIL/uL)    Hemoglobin 12.5 (*) 13.0 - 17.0 (g/dL)    HCT 37.0 (*) 39.0 -  52.0 (%)    MCV 87.3  78.0 - 100.0 (fL)    MCH 29.5  26.0 - 34.0 (pg)    MCHC 33.8  30.0 - 36.0 (g/dL)    RDW 14.3  11.5 - 15.5 (%)    Platelets 194  150 - 400 (K/uL)   DIFFERENTIAL     Status: Normal    Collection Time   09/23/11  7:59 PM      Component Value Range Comment   Neutrophils Relative 60  43 - 77 (%)    Neutro Abs 5.6  1.7 - 7.7 (K/uL)    Lymphocytes Relative 29  12 - 46 (%)    Lymphs Abs 2.7  0.7 - 4.0 (K/uL)    Monocytes Relative 9  3 - 12 (%)    Monocytes Absolute 0.8  0.1 - 1.0 (K/uL)    Eosinophils Relative 2  0 - 5 (%)    Eosinophils Absolute 0.2  0.0 - 0.7 (K/uL)    Basophils Relative 0  0 - 1 (%)    Basophils Absolute 0.0  0.0 - 0.1 (K/uL)   BASIC METABOLIC PANEL     Status: Abnormal   Collection Time   09/23/11  7:59 PM      Component Value Range Comment   Sodium 132 (*) 135 - 145 (mEq/L)    Potassium 4.5  3.5 - 5.1 (mEq/L)    Chloride 94 (*) 96 - 112 (mEq/L)    CO2 27  19 - 32 (mEq/L)    Glucose, Bld 303 (*) 70 - 99 (mg/dL)    BUN 92 (*) 6 - 23 (mg/dL)    Creatinine, Ser 2.67 (*) 0.50 - 1.35 (mg/dL)    Calcium 9.2  8.4 - 10.5 (mg/dL)    GFR calc non Af Amer 26 (*) >90 (mL/min)    GFR calc Af Amer 30 (*) >90 (mL/min)   LACTIC ACID, PLASMA     Status: Normal   Collection Time   09/23/11  8:49 PM      Component Value Range Comment   Lactic Acid, Venous 1.0  0.5 - 2.2 (mmol/L)   HEPATIC FUNCTION PANEL     Status: Abnormal   Collection Time   09/23/11 10:28 PM      Component Value Range Comment   Total Protein 6.3  6.0 - 8.3 (g/dL)    Albumin 3.4 (*) 3.5 - 5.2 (g/dL)    AST 19  0 - 37 (U/L)    ALT 23  0 - 53 (U/L)    Alkaline Phosphatase 75  39 - 117 (U/L)    Total Bilirubin 0.3  0.3 - 1.2 (mg/dL)    Bilirubin, Direct <0.1  0.0 - 0.3 (mg/dL)    Indirect Bilirubin NOT CALCULATED  0.3 - 0.9 (mg/dL)   MRSA PCR SCREENING     Status: Normal   Collection Time   09/23/11 11:48 PM      Component Value Range Comment   MRSA by PCR NEGATIVE  NEGATIVE    URINALYSIS, ROUTINE W REFLEX MICROSCOPIC     Status: Abnormal   Collection Time   09/23/11 11:53 PM      Component Value Range Comment   Color, Urine YELLOW  YELLOW     APPearance CLEAR  CLEAR     Specific  Gravity, Urine 1.010  1.005 - 1.030     pH 5.5  5.0 - 8.0     Glucose, UA 100 (*) NEGATIVE (mg/dL)    Hgb  urine dipstick TRACE (*) NEGATIVE     Bilirubin Urine NEGATIVE  NEGATIVE     Ketones, ur NEGATIVE  NEGATIVE (mg/dL)    Protein, ur 30 (*) NEGATIVE (mg/dL)    Urobilinogen, UA 0.2  0.0 - 1.0 (mg/dL)    Nitrite NEGATIVE  NEGATIVE     Leukocytes, UA NEGATIVE  NEGATIVE    URINE MICROSCOPIC-ADD ON     Status: Abnormal   Collection Time   09/23/11 11:53 PM      Component Value Range Comment   Squamous Epithelial / LPF RARE  RARE     WBC, UA 0-2  <3 (WBC/hpf)    RBC / HPF 0-2  <3 (RBC/hpf)    Bacteria, UA FEW (*) RARE     Casts HYALINE CASTS (*) NEGATIVE    HEMOGLOBIN A1C     Status: Abnormal   Collection Time   09/24/11 12:00 AM      Component Value Range Comment   Hemoglobin A1C 9.2 (*) <5.7 (%)    Mean Plasma Glucose 217 (*) <117 (mg/dL)   GLUCOSE, CAPILLARY     Status: Abnormal   Collection Time   09/24/11 12:17 AM      Component Value Range Comment   Glucose-Capillary 291 (*) 70 - 99 (mg/dL)    Comment 1 Documented in Chart      Comment 2 Notify RN     BASIC METABOLIC PANEL     Status: Abnormal   Collection Time   09/24/11  5:11 AM      Component Value Range Comment   Sodium 135  135 - 145 (mEq/L)    Potassium 3.7  3.5 - 5.1 (mEq/L) DELTA CHECK NOTED   Chloride 102  96 - 112 (mEq/L)    CO2 24  19 - 32 (mEq/L)    Glucose, Bld 203 (*) 70 - 99 (mg/dL)    BUN 74 (*) 6 - 23 (mg/dL)    Creatinine, Ser 2.06 (*) 0.50 - 1.35 (mg/dL)    Calcium 7.8 (*) 8.4 - 10.5 (mg/dL)    GFR calc non Af Amer 35 (*) >90 (mL/min)    GFR calc Af Amer 41 (*) >90 (mL/min)   CBC     Status: Abnormal   Collection Time   09/24/11  5:11 AM      Component Value Range Comment   WBC 9.1  4.0 - 10.5 (K/uL)    RBC 3.68 (*) 4.22 - 5.81 (MIL/uL)    Hemoglobin 10.9 (*) 13.0 - 17.0 (g/dL)    HCT 32.6 (*) 39.0 - 52.0 (%)    MCV 88.6  78.0 - 100.0 (fL)    MCH 29.6  26.0 - 34.0 (pg)    MCHC 33.4  30.0 - 36.0  (g/dL)    RDW 14.4  11.5 - 15.5 (%)    Platelets 163  150 - 400 (K/uL)   MAGNESIUM     Status: Normal   Collection Time   09/24/11  5:11 AM      Component Value Range Comment   Magnesium 2.1  1.5 - 2.5 (mg/dL)   TSH     Status: Normal   Collection Time   09/24/11  5:11 AM      Component Value Range Comment   TSH 0.822  0.350 - 4.500 (uIU/mL)   T4, FREE     Status: Normal   Collection Time   09/24/11  5:11 AM      Component Value Range Comment   Free T4  1.29  0.80 - 1.80 (ng/dL)   GLUCOSE, CAPILLARY     Status: Abnormal   Collection Time   09/24/11  7:44 AM      Component Value Range Comment   Glucose-Capillary 193 (*) 70 - 99 (mg/dL)    Comment 1 Notify RN      Comment 2 Documented in Chart     GLUCOSE, CAPILLARY     Status: Abnormal   Collection Time   09/24/11 11:41 AM      Component Value Range Comment   Glucose-Capillary 243 (*) 70 - 99 (mg/dL)    Comment 1 Notify RN      Comment 2 Documented in Chart     GLUCOSE, CAPILLARY     Status: Abnormal   Collection Time   09/24/11  4:21 PM      Component Value Range Comment   Glucose-Capillary 304 (*) 70 - 99 (mg/dL)    Comment 1 Notify RN      Comment 2 Documented in Chart     GLUCOSE, CAPILLARY     Status: Abnormal   Collection Time   09/24/11  9:27 PM      Component Value Range Comment   Glucose-Capillary 258 (*) 70 - 99 (mg/dL)    Comment 1 Notify RN     BASIC METABOLIC PANEL     Status: Abnormal   Collection Time   09/25/11  6:09 AM      Component Value Range Comment   Sodium 135  135 - 145 (mEq/L)    Potassium 4.2  3.5 - 5.1 (mEq/L)    Chloride 101  96 - 112 (mEq/L)    CO2 27  19 - 32 (mEq/L)    Glucose, Bld 227 (*) 70 - 99 (mg/dL)    BUN 60 (*) 6 - 23 (mg/dL)    Creatinine, Ser 1.81 (*) 0.50 - 1.35 (mg/dL)    Calcium 8.5  8.4 - 10.5 (mg/dL)    GFR calc non Af Amer 41 (*) >90 (mL/min)    GFR calc Af Amer 48 (*) >90 (mL/min)   GLUCOSE, CAPILLARY     Status: Abnormal   Collection Time   09/25/11  7:23 AM       Component Value Range Comment   Glucose-Capillary 217 (*) 70 - 99 (mg/dL)   GLUCOSE, CAPILLARY     Status: Abnormal   Collection Time   09/25/11 11:43 AM      Component Value Range Comment   Glucose-Capillary 300 (*) 70 - 99 (mg/dL)    Procedures: none  EKG: Sinus rhythm with 1st degree A-V block with Premature supraventricular complexes Anterolateral infarct (cited on or before 05-Nov-2000)  Full Code   Hospital Course: See history and physical for admission details. The patient is a 51 year old black male with multiple medical problems including multiple myeloma, systolic dysfunction. He has been having a lot of diarrhea related to the [name] therapy. He felt weak and dizzy and was seen by his primary care physician. He was orthostatic in the office and was sent to the emergency room. Last echocardiogram in February of this year showed an ejection fraction of about 35-40%. He had bilateral leg edema, but no shortness of breath or CHF symptoms. He chronic kidney disease with baseline creatinine about 1.5. His BUN and creatinine were elevated above normal and he was felt to be intravascularly depleted/dehydrated because of the diarrhea in the setting of CHF medications. His coreg, ACE inhibitor, and diuretics were held. He was started  on IV fluids. C. difficile was ordered but he had no diarrheal stools while here. His Remicade was continued. He had been taking Imodium as needed at home which wasn't working very well. I've given him a prescription for Lomotil. His blood pressure has normalized. His symptoms are resolved. His BUN and creatinine are improved. He has 1+ pitting edema today and no shortness of breath. His blood pressure has been about 105. I've recommended that he hold his ACE inhibitor and carvedilol until followup with Dr. [name]. I've recommended that he take his torsemide as needed for swelling or shortness of breath. Also, his blood sugars have been uncontrolled and his hemoglobin A1c  was above 8. I've increased his Lantus to 60 units a day. Total time on the day of discharge greater than 30 minutes.  Discharge Exam:  Blood pressure 105/72, pulse 65, temperature 97.9 F (36.6 C), temperature source Oral, resp. rate 16, height 6\' 3"  (1.905 m), weight 170.9 kg (376 lb 12.3 oz), SpO2 100.00%.  Unchanged from 09/24/11   Signed: Doree Barthel L 09/25/2011, 12:28 PM

## 2011-09-25 NOTE — Progress Notes (Signed)
[name] presents today for injection per MD orders. Velcade administered SQ in right Abdomen. Administration without incident. Patient tolerated well.

## 2011-09-25 NOTE — Progress Notes (Signed)
Outpatient Initial Nutrition Assessment  Date:09/25/2011   Time: 4:30 PM  Referring Physician: [place] Reason for Visit: obesity, gout  Nutrition Assessment:  Height: 6\' 3"  (190.5 cm)   Weight: 376 lb (170.552 kg) (used most recent weight from hospitalization)   IBW: 196# %IBW: 192% UBW: 425# %UBW: 88% Body mass index is 47.00 kg/(m^2).  Goal Weight: 350# Weight hx: Pt has struggled with weight for most of his wife. He reports his largest weight was over 500# in 2006, prior to receiving the Lap Band. He reports his lowest was around 390#, a weight he achieved with assistance of the Lap Band. He reports that his Lap Band was removed several years ago and he gained all of his weight back. He is slowly trying to lose weight. He reports he has lost about 48# (11.3%) in the past 6 months.   Estimated nutritional needs: 4272-5127 kcals daily, 205-259 grams protein daily, 4.3-5.1 L fluid daily   PMH:  Past Medical History  Diagnosis Date  . Arteriosclerotic cardiovascular disease (ASCVD)     MI-2000s; stent to the proximal LAD and diagonal in 2001; stress nuclear in 2008-impaired exercise capacity, left ventricular dilatation, moderately to severely depressed EF, apical, inferior and anteroseptal scar  . Sleep apnea   . Hypertension   . 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 Multiple myeloma 07/01/2011  . Diabetes mellitus     Insulin  . GERD (gastroesophageal reflux disease)   . Pedal edema     Venous insufficiency  . Obesity   . Hyperlipidemia   . Chronic kidney disease, stage 3, mod decreased GFR     Creatinine of 1.84 in 06/2011 and 1.5 in 07/2011  . Injection Site Reaction     Medications:  Current Outpatient Rx  Name Route Sig Dispense Refill  . ACYCLOVIR 400 MG PO TABS Oral Take 400 mg by mouth every morning. To start Jul 07, 2011 (to take daily while on chemo)    . ASPIRIN EC 325 MG PO TBEC Oral Take 325 mg by mouth every morning.     Marland Kitchen BORTEZOMIB CHEMO IV  INJECTION 3.5 MG Intravenous Inject 1.3 mg/m2 into the vein every 21 ( twenty-one) days. To start Jul 11, 2011    . HYDROCOD POLST-CPM POLST ER 10-8 MG/5ML PO LQCR Oral Take 5 mLs by mouth every 12 (twelve) hours. 140 mL 1  . VITAMIN D 2000 UNITS PO CAPS Oral Take 1 capsule by mouth every morning.     Marland Kitchen CLINDAMYCIN PHOSPHATE 1 % EX GEL Topical Apply topically 2 (two) times daily. 30 g 0  . DEXAMETHASONE 4 MG PO TABS  Take 5 decadron tablets BID every Friday! Start Friday Feb 8th. 80 tablet 4  . DEXLANSOPRAZOLE 60 MG PO CPDR Oral Take 60 mg by mouth every morning.     Marland Kitchen FIRST-DUKES MOUTHWASH MT SUSP Mouth/Throat Use as directed 5 mLs in the mouth or throat 4 (four) times daily as needed. 240 mL 1  . DIPHENOXYLATE-ATROPINE 2.5-0.025 MG PO TABS Oral Take 1 tablet by mouth 4 (four) times daily as needed for diarrhea or loose stools. 30 tablet 0  . INSULIN GLARGINE 100 UNIT/ML [place] SOLN Subcutaneous Inject 60 Units into the skin daily before breakfast. 10 mL   . METANX PO Oral Take 1 tablet by mouth every morning.     Marland Kitchen LENALIDOMIDE 25 MG PO CAPS Oral Take 25 mg by mouth See admin instructions. Take one tablet daily every day for 14 days of  each month, then STOP for 7 days, then repeat.    Marland Kitchen LIDOCAINE-PRILOCAINE 2.5-2.5 % EX CREA Topical Apply 1 application topically as needed. To port site. Apply 1 hour prior to treatment. Do not rub in. Cover with plastic.    Marland Kitchen LIRAGLUTIDE 18 MG/3ML [place] SOLN Subcutaneous Inject 1.2 mg into the skin every evening.     Marland Kitchen LORATADINE 10 MG PO TABS Oral Take 10 mg by mouth every morning.     Marland Kitchen LORAZEPAM 1 MG PO TABS Oral Take 1 mg by mouth every 4 (four) hours as needed. Nausea/vomiting    . THERA M PLUS PO TABS Oral Take 1 tablet by mouth every morning.      Marland Kitchen ONDANSETRON HCL 8 MG PO TABS Oral Take 8 mg by mouth 2 (two) times daily as needed. Nausea/vomiting. Patient to also take this 30 minutes to 1 hour prior to Velcade injections.    Marland Kitchen POTASSIUM CHLORIDE CRYS ER 10 MEQ PO  TBCR Oral Take 10 mEq by mouth every morning.     Marland Kitchen ROSUVASTATIN CALCIUM 10 MG PO TABS Oral Take 10 mg by mouth every morning.     . SULFAMETHOXAZOLE-TRIMETHOPRIM 800-160 MG PO TABS Oral Take 1 tablet by mouth 3 (three) times a week. To start Jul 07, 2011 (to take MWF while on chemo)    . CIALIS PO Oral Take by mouth as needed.      . TORSEMIDE 20 MG PO TABS Oral Take 2 tablets (40 mg total) by mouth daily as needed (swelling or shortness of breath).    Chesley Mires IV Intravenous Inject 2 mg into the vein every 28 (twenty-eight) days. To start Jul 11, 2011      Labs: [facility]     Component Value Date/Time   NA 135 09/25/2011 0609   K 4.2 09/25/2011 0609   CL 101 09/25/2011 0609   CO2 27 09/25/2011 0609   GLUCOSE 227* 09/25/2011 0609   BUN 60* 09/25/2011 0609   CREATININE 1.81* 09/25/2011 0609   CALCIUM 8.5 09/25/2011 0609   PROT 6.3 09/23/2011 2228   ALBUMIN 3.4* 09/23/2011 2228   AST 19 09/23/2011 2228   ALT 23 09/23/2011 2228   ALKPHOS 75 09/23/2011 2228   BILITOT 0.3 09/23/2011 2228   GFRNONAA 41* 09/25/2011 0609   GFRAA 48* 09/25/2011 0609    Lipid Panel     Component Value Date/Time   CHOL 158 07/18/2010 1724   TRIG 173 07/18/2010 1724   HDL 33 07/18/2010 1724   LDLCALC 90 07/18/2010 1724     Lab Results  Component Value Date   HGBA1C 9.2* 09/24/2011   HGBA1C 9.8 07/18/2010   HGBA1C 7.4 04/30/2009   Lab Results  Component Value Date   LDLCALC 90 07/18/2010   CREATININE 1.81* 09/25/2011     Lifestyle/ social habits: 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 He lives with wife and 3 step children (ages 30, 65, and 29) in [place]. He was recently diagnosed with multiple myeloma and is currently receiving chemotherapy. He reports a stress level as 7, citing health and business as his major sources of stress.   Nutrition hx/habits: Mr. Urdaneta is not physically active. He reports he used to swim, but he is no longer able to. He goes out to eat often. He  drinks low-calorie drinks such as diet soda and unsweetened tea. Pt familiar to me, as he and his wife attended [facility] diabetes class on  06/26/11 (see documentation note). He is taking CBGs before each meal. He reports his blood sugars have been extremely high, due to being on prednisone and decadron (in the 200-300's). He reports prior to these medications, CBGs were in the mid 100's.  Diet recall: pt declined  Nutrition Diagnosis: Nutrition-related knowledge deficit r/t gout diet AEB new dx of gout.   Nutrition Intervention: Nutrition rx: NAS, diabetic, low purine diet; low calorie beverages only; 3 meals per day; avoid high purine foods; 1 serving of medium purine foods when not experiencing flare ups; avoid medium purine foods during flare-ups  Education/Counseling Provided: Pt declined education on diabetes and weight loss. Pt attended diabetes class at [facility] with wife on 06/26/11. He reports he is losing weight and on "a plan". He is only interested in education about diet for gout. Pt and wife have no questions regarding diet for diabetes and weight loss at this time. Educated pt and wife on gout diet principles (high purine diet). Discussed high purine foods and importance of avoiding them. Discussed medium purine foods; discussed eating 1 serving of medium purine foods daily when there are no flare-ups and avoiding medium purine foods during times of flare ups. Discussed meal planning and nutritional content of commonly eaten foods. Pt has many questions and these questions were answered to his satisfaction. Provided low purine diet handout.   Understanding, Motivation, Ability to Follow Recommendations: Expect fair to good compliance.   Monitoring and Evaluation: Goals: 1) 1-2# weight loss per week; 2) Avoid high purine foods; 3) 1 serving of medium purine foods when not experiencing flare ups and avoid medium purine foods during flare-ups  Recommendations: 1) For weight loss: [id number] kcals  daily; 2) Continue with weight loss; 3) Plan meal ahead to ensure good meal planning strategies; 4) Consider keeping a food diary  F/U: PRN. Provided RD contact information.   Alene Mires, [place]  09/25/2011  Time: 4:30 PM

## 2011-09-25 NOTE — Progress Notes (Signed)
Franklin Font, MD, MD [name] [id number]  1. Multiple myeloma  CBC, Differential, Comprehensive metabolic panel, Multiple myeloma panel, serum, Kappa/lambda light chains    CURRENT THERAPY: Revlmid 25 mg 10 days on this cycle, followed by 14 days on, 7 days off on next cycle.  S/P day 4 cycle 4 of Velcade.   INTERVAL HISTORY: [name] 51 y.o. male returns for  regular  visit for followup of  Stage II multiple myeloma with a beta 2 microglobulin level less than 3.5 and albumin of 3.4, and of course, he presented with Kappa light chain disease.   The patient is seen today following discharge from the hospital.  I spoke with Dr. Conley Canal [facility]) about the patient.  She reports that he was admitted with dehydration secondary to dehydration.  He was having a bought of diarrhea and was likely dehydrated from this.  He was admitted overnight for hydration and monitoring.  He was discharged within 24 hours.  He reports to the clinic today after being discharged.  The patient reports that he feels well.  He was given an Rx for Lomotil for future diarrhea episodes.  It is thought that his diarrhea is secondary to his [name] chemotherapy.  He was taking this medication 25 mg daily.  He has since been reduced to 10 days of 25 mg [name] for this cycle.  His next cycle, he will take the medication 14 days on and 7 days off.    He is here today for his Velcade injection.  His blood counts are adequate. He maintains renal insufficiency which is improved compared to yesterday.  He was seen in [facility] for consideration of bone marrow transplant.  He was pleased with consultation.    Past Medical History  Diagnosis Date  . Arteriosclerotic cardiovascular disease (ASCVD)     MI-2000s; stent to the proximal LAD and diagonal in 2001; stress nuclear in 2008-impaired exercise capacity, left ventricular dilatation, moderately to severely depressed EF, apical, inferior and  anteroseptal scar  . Sleep apnea   . Hypertension   . 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 Multiple myeloma 07/01/2011  . Diabetes mellitus     Insulin  . GERD (gastroesophageal reflux disease)   . Pedal edema     Venous insufficiency  . Obesity   . Hyperlipidemia   . Chronic kidney disease, stage 3, mod decreased GFR     Creatinine of 1.84 in 06/2011 and 1.5 in 07/2011  . Injection Site Reaction     has DIABETES MELLITUS, TYPE II, ON INSULIN; Morbid obesity; SLEEP APNEA; Multiple myeloma; Arteriosclerotic cardiovascular disease (ASCVD); Hypertension; Hyperlipidemia; Acute on chronic renal failure; Diarrhea; Orthostasis; Edema of both legs; Systolic dysfunction; and Dehydration on his problem list.      has no known allergies.  [name] had no medications administered during this visit.  Past Surgical History  Procedure Date  . Laparoscopic gastric banding 2006  . Wrist surgery     Left; removal of bone fragment  . Incision and drainage abscess anal   . Abscess drainage     Scrotal  . Bone marrow biopsy 05/13/11  . Portacath placement 07/07/2011    Procedure: INSERTION PORT-A-CATH;  Surgeon: Scherry Ran, MD;  Location: [facility];  Service: General;  Laterality: N/A;    Denies any headaches, dizziness, double vision, fevers, chills, night sweats, nausea, vomiting, constipation, chest pain, heart palpitations, shortness of breath, blood in stool, black tarry stool, urinary pain,  urinary burning, urinary frequency, hematuria.   PHYSICAL EXAMINATION  ECOG PERFORMANCE STATUS: 1 - Symptomatic but completely ambulatory  There were no vitals filed for this visit.  GENERAL:alert, no distress, well nourished, well developed, comfortable, cooperative, obese and smiling SKIN: skin color, texture, turgor are normal, no rashes or significant lesions HEAD: Normocephalic, No masses, lesions, tenderness or abnormalities EYES: normal, EOMI, Conjunctiva are pink and non-injected EARS:  External ears normal OROPHARYNX:lips, buccal mucosa, and tongue normal and mucous membranes are moist  NECK: supple, trachea midline LYMPH:  not examined BREAST:not examined LUNGS: clear to auscultation  HEART: regular rate & rhythm, no murmurs, no gallops, S1 normal and S2 normal ABDOMEN:abdomen soft and normal bowel sounds, obese BACK: Back symmetric, no curvature. EXTREMITIES:less then 2 second capillary refill, no joint deformities, effusion, or inflammation, no skin discoloration, no clubbing, no cyanosis  NEURO: alert & oriented x 3 with fluent speech, no focal motor/sensory deficits, gait normal   LABORATORY DATA: CBC    Component Value Date/Time   WBC 9.1 09/24/2011 0511   RBC 3.68* 09/24/2011 0511   HGB 10.9* 09/24/2011 0511   HCT 32.6* 09/24/2011 0511   PLT 163 09/24/2011 0511   MCV 88.6 09/24/2011 0511   MCH 29.6 09/24/2011 0511   MCHC 33.4 09/24/2011 0511   RDW 14.4 09/24/2011 0511   LYMPHSABS 2.7 09/23/2011 1959   MONOABS 0.8 09/23/2011 1959   EOSABS 0.2 09/23/2011 1959   BASOSABS 0.0 09/23/2011 1959      Chemistry      Component Value Date/Time   NA 135 09/25/2011 0609   K 4.2 09/25/2011 0609   CL 101 09/25/2011 0609   CO2 27 09/25/2011 0609   BUN 60* 09/25/2011 0609   CREATININE 1.81* 09/25/2011 0609      Component Value Date/Time   CALCIUM 8.5 09/25/2011 0609   ALKPHOS 75 09/23/2011 2228   AST 19 09/23/2011 2228   ALT 23 09/23/2011 2228   BILITOT 0.3 09/23/2011 2228          ASSESSMENT:  1. Stage II multiple myeloma with a beta 2 microglobulin level less than 3.5 and albumin of 3.4, and of course, he presented with Kappa light chain disease.  2. Sinusitis, which has resolved.  3. Morbid obesity, status post an attempt at gastric bypass surgery in the past. 4. Dehydration with renal insufficiency, improved. 5. Diarrhea, ?secondary to [name] causing a dose reduction from 25 mg daily to 14 days on, 7 days off.  PLAN:  1. Velcade injection today 2. Take [name]  as directed.  10 days this cycle.  Subsequent cycle will be 14 days on and 7 days off.  3.  Lab work today reviewed and his counts are adequate for treatment. 4. Return as scheduled for Velcade injections. 5. Lomotil as directed for diarrhea. 6. Continue to push PO fluids.  7. Lab work beginning of next cycle: CBC diff, CMET, MM panel 8. Return in 1 month for follow-up.  All questions were answered. The patient knows to call the clinic with any problems, questions or concerns. We can certainly see the patient much sooner if necessary.   Ryver Zadrozny

## 2011-09-25 NOTE — Discharge Planning (Signed)
Pt d/c via w/c to [facility] for appt. D/c porta cath. Explained d/c instructions to pt and wife.

## 2011-09-29 ENCOUNTER — Encounter (HOSPITAL_BASED_OUTPATIENT_CLINIC_OR_DEPARTMENT_OTHER): Payer: PRIVATE HEALTH INSURANCE

## 2011-09-29 DIAGNOSIS — Z5112 Encounter for antineoplastic immunotherapy: Secondary | ICD-10-CM

## 2011-09-29 DIAGNOSIS — C9000 Multiple myeloma not having achieved remission: Secondary | ICD-10-CM

## 2011-09-29 MED ORDER — BORTEZOMIB CHEMO SQ INJECTION 3.5 MG (2.5MG/ML)
1.1700 mg/m2 | Freq: Once | INTRAMUSCULAR | Status: AC
  Administered 2011-09-29: 3 mg via SUBCUTANEOUS
  Filled 2011-09-29: qty 1.2

## 2011-09-29 NOTE — Progress Notes (Signed)
Velcade injection given in lower lt abd.  Tolerated well.

## 2011-10-01 ENCOUNTER — Telehealth (HOSPITAL_COMMUNITY): Payer: Self-pay | Admitting: Oncology

## 2011-10-01 ENCOUNTER — Ambulatory Visit (HOSPITAL_COMMUNITY): Payer: PRIVATE HEALTH INSURANCE | Admitting: Oncology

## 2011-10-01 NOTE — Telephone Encounter (Signed)
[facility] PC FROM [facility] [phone] EXT [phone]. ADVSD HER THAT PT WAS SEEN ON 3/22*3/24*4/1. SHE ALSO STATED THAT SHE IS SENDING A FIN APP FOR [name]. PTS COPAY IS OVER $600

## 2011-10-01 NOTE — Telephone Encounter (Signed)
[facility] A CALL FROM [facility]I STATING [facility] IS HANDLING PT'S AUTH'S FOR CHEMO. PER JONI SHE FORWARDED CLINICALS TO THEM FOR REVIEW. I SHOULD CALL [phone] FOR ALL CHEMO AUTH'S

## 2011-10-01 NOTE — Telephone Encounter (Signed)
[name] [name] [name] [name] [name]. WAS ADVSD TO FAX IN NOTES ETC. FAX# [phone]

## 2011-10-03 ENCOUNTER — Encounter (HOSPITAL_COMMUNITY): Payer: PRIVATE HEALTH INSURANCE | Attending: Oncology

## 2011-10-03 DIAGNOSIS — C9000 Multiple myeloma not having achieved remission: Secondary | ICD-10-CM

## 2011-10-03 DIAGNOSIS — R197 Diarrhea, unspecified: Secondary | ICD-10-CM | POA: Insufficient documentation

## 2011-10-03 LAB — CBC
HCT: 35.6 % — ABNORMAL LOW (ref 39.0–52.0)
Hemoglobin: 11.9 g/dL — ABNORMAL LOW (ref 13.0–17.0)
MCH: 29.8 pg (ref 26.0–34.0)
MCHC: 33.4 g/dL (ref 30.0–36.0)
MCV: 89 fL (ref 78.0–100.0)
Platelets: 107 10*3/uL — ABNORMAL LOW (ref 150–400)
RBC: 4 MIL/uL — ABNORMAL LOW (ref 4.22–5.81)
RDW: 15.4 % (ref 11.5–15.5)
WBC: 7.3 10*3/uL (ref 4.0–10.5)

## 2011-10-03 LAB — DIFFERENTIAL
Basophils Absolute: 0 10*3/uL (ref 0.0–0.1)
Basophils Relative: 0 % (ref 0–1)
Eosinophils Absolute: 0.1 10*3/uL (ref 0.0–0.7)
Eosinophils Relative: 1 % (ref 0–5)
Lymphocytes Relative: 12 % (ref 12–46)
Lymphs Abs: 0.9 10*3/uL (ref 0.7–4.0)
Monocytes Absolute: 0.3 10*3/uL (ref 0.1–1.0)
Monocytes Relative: 4 % (ref 3–12)
Neutro Abs: 6 10*3/uL (ref 1.7–7.7)
Neutrophils Relative %: 83 % — ABNORMAL HIGH (ref 43–77)

## 2011-10-03 LAB — BASIC METABOLIC PANEL
BUN: 39 mg/dL — ABNORMAL HIGH (ref 6–23)
CO2: 19 mEq/L (ref 19–32)
Calcium: 10.1 mg/dL (ref 8.4–10.5)
Chloride: 102 mEq/L (ref 96–112)
Creatinine, Ser: 1.67 mg/dL — ABNORMAL HIGH (ref 0.50–1.35)
GFR calc Af Amer: 53 mL/min — ABNORMAL LOW (ref >90)
GFR calc non Af Amer: 46 mL/min — ABNORMAL LOW (ref >90)
Glucose, Bld: 254 mg/dL — ABNORMAL HIGH (ref 70–99)
Potassium: 4.1 mEq/L (ref 3.5–5.1)
Sodium: 135 mEq/L (ref 135–145)

## 2011-10-03 MED ORDER — ZOLEDRONIC ACID 4 MG/5ML IV CONC
2.0000 mg | Freq: Once | INTRAVENOUS | Status: AC
  Administered 2011-10-03: 2 mg via INTRAVENOUS
  Filled 2011-10-03: qty 2.5

## 2011-10-03 MED ORDER — HEPARIN SOD (PORK) LOCK FLUSH 100 UNIT/ML IV SOLN
500.0000 [IU] | Freq: Once | INTRAVENOUS | Status: AC | PRN
  Administered 2011-10-03: 500 [IU]
  Filled 2011-10-03: qty 5

## 2011-10-03 MED ORDER — ALTEPLASE 2 MG IJ SOLR
2.0000 mg | Freq: Once | INTRAMUSCULAR | Status: DC | PRN
  Filled 2011-10-03: qty 2

## 2011-10-03 MED ORDER — SODIUM CHLORIDE 0.9 % IJ SOLN
10.0000 mL | INTRAMUSCULAR | Status: DC | PRN
  Administered 2011-10-03: 10 mL
  Filled 2011-10-03: qty 10

## 2011-10-03 MED ORDER — SODIUM CHLORIDE 0.9 % IV SOLN
Freq: Once | INTRAVENOUS | Status: AC
  Administered 2011-10-03: 15:00:00 via INTRAVENOUS

## 2011-10-03 NOTE — Progress Notes (Signed)
Tolerated  Infusion well.

## 2011-10-06 ENCOUNTER — Encounter (HOSPITAL_BASED_OUTPATIENT_CLINIC_OR_DEPARTMENT_OTHER): Payer: PRIVATE HEALTH INSURANCE

## 2011-10-06 ENCOUNTER — Other Ambulatory Visit (HOSPITAL_COMMUNITY): Payer: Self-pay | Admitting: Oncology

## 2011-10-06 VITALS — BP 125/84 | HR 64 | Temp 97.1°F

## 2011-10-06 DIAGNOSIS — C9000 Multiple myeloma not having achieved remission: Secondary | ICD-10-CM

## 2011-10-06 DIAGNOSIS — R197 Diarrhea, unspecified: Secondary | ICD-10-CM

## 2011-10-06 DIAGNOSIS — Z5112 Encounter for antineoplastic immunotherapy: Secondary | ICD-10-CM

## 2011-10-06 MED ORDER — BORTEZOMIB CHEMO SQ INJECTION 3.5 MG (2.5MG/ML)
1.1700 mg/m2 | Freq: Once | INTRAMUSCULAR | Status: AC
  Administered 2011-10-06: 3 mg via SUBCUTANEOUS
  Filled 2011-10-06: qty 1.2

## 2011-10-07 LAB — KAPPA/LAMBDA LIGHT CHAINS
Kappa free light chain: 4.69 mg/dL — ABNORMAL HIGH (ref 0.33–1.94)
Kappa, lambda light chain ratio: 0.99 (ref 0.26–1.65)
Lambda free light chains: 4.74 mg/dL — ABNORMAL HIGH (ref 0.57–2.63)

## 2011-10-08 LAB — MULTIPLE MYELOMA PANEL, SERUM
Albumin ELP: 55.2 % — ABNORMAL LOW (ref 55.8–66.1)
Alpha-1-Globulin: 7.4 % — ABNORMAL HIGH (ref 2.9–4.9)
Alpha-2-Globulin: 14.8 % — ABNORMAL HIGH (ref 7.1–11.8)
Beta 2: 6.1 % (ref 3.2–6.5)
Beta Globulin: 7.1 % (ref 4.7–7.2)
Gamma Globulin: 9.4 % — ABNORMAL LOW (ref 11.1–18.8)
IgA: 141 mg/dL (ref 68–379)
IgG (Immunoglobin G), Serum: 557 mg/dL — ABNORMAL LOW (ref 650–1600)
IgM, Serum: 91 mg/dL (ref 41–251)
M-Spike, %: NOT DETECTED g/dL
Total Protein: 6 g/dL (ref 6.0–8.3)

## 2011-10-09 ENCOUNTER — Encounter (HOSPITAL_BASED_OUTPATIENT_CLINIC_OR_DEPARTMENT_OTHER): Payer: PRIVATE HEALTH INSURANCE

## 2011-10-09 VITALS — BP 124/83 | HR 83 | Temp 98.0°F

## 2011-10-09 DIAGNOSIS — R197 Diarrhea, unspecified: Secondary | ICD-10-CM

## 2011-10-09 DIAGNOSIS — C9000 Multiple myeloma not having achieved remission: Secondary | ICD-10-CM

## 2011-10-09 DIAGNOSIS — Z5112 Encounter for antineoplastic immunotherapy: Secondary | ICD-10-CM

## 2011-10-09 MED ORDER — BORTEZOMIB CHEMO SQ INJECTION 3.5 MG (2.5MG/ML)
1.1700 mg/m2 | Freq: Once | INTRAMUSCULAR | Status: AC
  Administered 2011-10-09: 3 mg via SUBCUTANEOUS
  Filled 2011-10-09: qty 1.2

## 2011-10-09 NOTE — Progress Notes (Signed)
Roda Shutters presents today for injection per MD orders. velcade 3 mg administered SQ in right Abdomen. Administration without incident. Patient tolerated well. [name] states he has diarrhea 2-3 times daily. He has lomotil at home for this and is using as directed.

## 2011-10-13 ENCOUNTER — Encounter: Payer: Self-pay | Admitting: Oncology

## 2011-10-13 ENCOUNTER — Encounter (HOSPITAL_BASED_OUTPATIENT_CLINIC_OR_DEPARTMENT_OTHER): Payer: PRIVATE HEALTH INSURANCE

## 2011-10-13 VITALS — BP 132/82 | HR 62 | Temp 97.2°F | Wt 375.0 lb

## 2011-10-13 DIAGNOSIS — C9000 Multiple myeloma not having achieved remission: Secondary | ICD-10-CM

## 2011-10-13 DIAGNOSIS — R197 Diarrhea, unspecified: Secondary | ICD-10-CM

## 2011-10-13 DIAGNOSIS — Z5112 Encounter for antineoplastic immunotherapy: Secondary | ICD-10-CM

## 2011-10-13 LAB — DIFFERENTIAL
Basophils Absolute: 0 10*3/uL (ref 0.0–0.1)
Basophils Relative: 0 % (ref 0–1)
Eosinophils Absolute: 0.4 10*3/uL (ref 0.0–0.7)
Eosinophils Relative: 8 % — ABNORMAL HIGH (ref 0–5)
Lymphocytes Relative: 26 % (ref 12–46)
Lymphs Abs: 1.5 10*3/uL (ref 0.7–4.0)
Monocytes Absolute: 0.4 10*3/uL (ref 0.1–1.0)
Monocytes Relative: 7 % (ref 3–12)
Neutro Abs: 3.3 10*3/uL (ref 1.7–7.7)
Neutrophils Relative %: 59 % (ref 43–77)

## 2011-10-13 LAB — CBC
HCT: 32.3 % — ABNORMAL LOW (ref 39.0–52.0)
Hemoglobin: 11 g/dL — ABNORMAL LOW (ref 13.0–17.0)
MCH: 30.4 pg (ref 26.0–34.0)
MCHC: 34.1 g/dL (ref 30.0–36.0)
MCV: 89.2 fL (ref 78.0–100.0)
Platelets: 140 10*3/uL — ABNORMAL LOW (ref 150–400)
RBC: 3.62 MIL/uL — ABNORMAL LOW (ref 4.22–5.81)
RDW: 16.3 % — ABNORMAL HIGH (ref 11.5–15.5)
WBC: 5.6 10*3/uL (ref 4.0–10.5)

## 2011-10-13 MED ORDER — BORTEZOMIB CHEMO SQ INJECTION 3.5 MG (2.5MG/ML)
1.1700 mg/m2 | Freq: Once | INTRAMUSCULAR | Status: AC
  Administered 2011-10-13: 3 mg via SUBCUTANEOUS
  Filled 2011-10-13: qty 1.2

## 2011-10-13 MED ORDER — ONDANSETRON HCL 4 MG PO TABS
8.0000 mg | ORAL_TABLET | Freq: Once | ORAL | Status: AC
  Administered 2011-10-13: 8 mg via ORAL

## 2011-10-13 MED ORDER — ONDANSETRON HCL 4 MG PO TABS
ORAL_TABLET | ORAL | Status: AC
  Filled 2011-10-13: qty 2

## 2011-10-15 ENCOUNTER — Ambulatory Visit (HOSPITAL_COMMUNITY)
Admission: RE | Admit: 2011-10-15 | Discharge: 2011-10-15 | Disposition: A | Discharge status: Home / Self Care | Payer: PRIVATE HEALTH INSURANCE | Source: Ambulatory Visit | Attending: Adult Health | Admitting: Adult Health

## 2011-10-15 ENCOUNTER — Encounter: Payer: Self-pay | Admitting: Adult Health

## 2011-10-15 ENCOUNTER — Ambulatory Visit (INDEPENDENT_AMBULATORY_CARE_PROVIDER_SITE_OTHER): Payer: PRIVATE HEALTH INSURANCE | Admitting: Adult Health

## 2011-10-15 VITALS — BP 148/86 | HR 59 | Resp 18 | Ht 75.0 in | Wt 394.0 lb

## 2011-10-15 DIAGNOSIS — I2510 Atherosclerotic heart disease of native coronary artery without angina pectoris: Secondary | ICD-10-CM

## 2011-10-15 DIAGNOSIS — I509 Heart failure, unspecified: Secondary | ICD-10-CM

## 2011-10-15 DIAGNOSIS — C9000 Multiple myeloma not having achieved remission: Secondary | ICD-10-CM | POA: Insufficient documentation

## 2011-10-15 DIAGNOSIS — I519 Heart disease, unspecified: Secondary | ICD-10-CM

## 2011-10-15 DIAGNOSIS — I10 Essential (primary) hypertension: Secondary | ICD-10-CM

## 2011-10-15 DIAGNOSIS — I517 Cardiomegaly: Secondary | ICD-10-CM | POA: Insufficient documentation

## 2011-10-15 MED ORDER — CARVEDILOL 3.125 MG PO TABS: 3.1250 mg | ORAL_TABLET | Freq: Two times a day (BID) | ORAL | Status: DC

## 2011-10-15 NOTE — Patient Instructions (Signed)
Your physician recommends that you schedule a follow-up appointment in: 1 week  Weigh daily and keep a record to bring to your next visit  A chest x-ray takes a picture of the organs and structures inside the chest, including the heart, lungs, and blood vessels. This test can show several things, including, whether the heart is enlarges; whether fluid is building up in the lungs; and whether pacemaker / defibrillator leads are still in place.  Your physician recommends that you return for lab work in: Today and in 1 week (have done prior to your scheduled appointment)  Your physician has recommended you make the following change in your medication:  1 - START Torsemide 40 mg daily 2 - START Coreg (Carvedilol) 3.125 mg twice daily 3 - START Potassium 10 meq daily

## 2011-10-15 NOTE — Assessment & Plan Note (Signed)
No complaints of chest pain. He will restart low dose beta blocker. BP is well within range to do this. Will not start back the ramipril at this time. Will consider repeating echo on next visit.

## 2011-10-15 NOTE — Progress Notes (Addendum)
HPI: [name] is a 51 y/o morbidly obese patient of Dr.Rothbart we are following for non-ischemic cardiomyopathy, EF of 35%-40% per echo in March of 2013, CAD, hypertension, with known history of multiple myeloma.  He was recently admitted to [facility] for diarrhea, renal failure and dehydration secondary to chemo-therapy. He was hydrated and taken off of torsemide, coreg,and ramipril. He comes today because of concerns about weight gain of 18 lbs. He is having LEE and abdominal distention. He denies PND, orthopnea, or chest pressure. He admits to some dietary indiscretion eating canned foods and eating out. He continues on chemo and is also asked to drink in 1-3 liters a day of fluid per oncology. Recent labs have been completed on 10/03/2011 results: Creatinine 1.67 (1.81 on discharge), K+ 4.1, GFR 53 (48 on discharge).   No Known Allergies  Current Outpatient Prescriptions  Medication Sig Dispense Refill  . acyclovir (ZOVIRAX) 400 MG tablet Take 400 mg by mouth every morning. To start Jul 07, 2011 (to take daily while on chemo)      . allopurinol (ZYLOPRIM) 300 MG tablet Take 300 mg by mouth daily.      Marland Kitchen aspirin EC 325 MG tablet Take 325 mg by mouth every morning.       . bortezomib IV (VELCADE) 3.5 MG injection Inject 1.3 mg/m2 into the vein every 21 ( twenty-one) days. To start Jul 11, 2011      . Cholecalciferol (VITAMIN D) 2000 UNITS CAPS Take 1 capsule by mouth every morning.       Marland Kitchen dexamethasone (DECADRON) 4 MG tablet Take 5 decadron tablets BID every Friday! Start Friday Feb 8th.  80 tablet  4  . dexlansoprazole (DEXILANT) 60 MG capsule Take 60 mg by mouth every morning.       . insulin aspart (NOVOLOG) 100 UNIT/ML injection Inject into the skin as needed.      . insulin glargine (LANTUS) 100 UNIT/ML injection Inject 60 Units into the skin daily before breakfast.  10 mL    . L-Methylfolate-B6-B12 (METANX PO) Take 1 tablet by mouth every morning.       Marland Kitchen lenalidomide ([name]) 25 MG capsule  Take 25 mg by mouth See admin instructions. Take one tablet daily every day for 14 days of each month, then STOP for 7 days, then repeat.      . lidocaine-prilocaine (EMLA) cream Apply 1 application topically as needed. To port site. Apply 1 hour prior to treatment. Do not rub in. Cover with plastic.      . Liraglutide (VICTOZA) 18 MG/3ML SOLN Inject 1.2 mg into the skin every evening.       . loratadine (CLARITIN) 10 MG tablet Take 10 mg by mouth every morning.       Marland Kitchen LORazepam (ATIVAN) 1 MG tablet Take 1 mg by mouth every 4 (four) hours as needed. Nausea/vomiting      . Multiple Vitamins-Minerals (MULTIVITAMINS THER. W/MINERALS) TABS Take 1 tablet by mouth every morning.        . ondansetron (ZOFRAN) 8 MG tablet Take 8 mg by mouth 2 (two) times daily as needed. Nausea/vomiting. Patient to also take this 30 minutes to 1 hour prior to Velcade injections.      . potassium chloride (K-DUR,KLOR-CON) 10 MEQ tablet Take 10 mEq by mouth every morning.       . prochlorperazine (COMPAZINE) 10 MG tablet Take 10 mg by mouth every 6 (six) hours as needed.      . rosuvastatin (  CRESTOR) 10 MG tablet Take 10 mg by mouth every morning.       . sulfamethoxazole-trimethoprim (BACTRIM DS,SEPTRA DS) 800-160 MG per tablet Take 1 tablet by mouth 3 (three) times a week. To start Jul 07, 2011 (to take MWF while on chemo)      . Tadalafil (CIALIS PO) Take by mouth as needed.        . torsemide (DEMADEX) 20 MG tablet Take 40 mg by mouth daily.      . Zoledronic Acid (ZOMETA IV) Inject 2 mg into the vein every 28 (twenty-eight) days. To start Jul 11, 2011      . carvedilol (COREG) 3.125 MG tablet Take 1 tablet (3.125 mg total) by mouth 2 (two) times daily.  60 tablet  6    Past Medical History  Diagnosis Date  . Arteriosclerotic cardiovascular disease (ASCVD)     MI-2000s; stent to the proximal LAD and diagonal in 2001; stress nuclear in 2008-impaired exercise capacity, left ventricular dilatation, moderately to severely  depressed EF, apical, inferior and anteroseptal scar  . Sleep apnea   . Hypertension   . 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 Multiple myeloma 07/01/2011  . Diabetes mellitus     Insulin  . GERD (gastroesophageal reflux disease)   . Pedal edema     Venous insufficiency  . Obesity   . Hyperlipidemia   . Chronic kidney disease, stage 3, mod decreased GFR     Creatinine of 1.84 in 06/2011 and 1.5 in 07/2011  . Injection Site Reaction     Past Surgical History  Procedure Date  . Laparoscopic gastric banding 2006  . Wrist surgery     Left; removal of bone fragment  . Incision and drainage abscess anal   . Abscess drainage     Scrotal  . Bone marrow biopsy 05/13/11  . Portacath placement 07/07/2011    Procedure: INSERTION PORT-A-CATH;  Surgeon: Scherry Ran, MD;  Location: [facility];  Service: General;  Laterality: N/A;    [id number] of systems complete and found to be negative unless listed above  PHYSICAL EXAM BP 148/86  Pulse 59  Resp 18  Ht 6\' 3"  (1.905 m)  Wt 394 lb (178.717 kg)  BMI 49.25 kg/m2  General: Well developed, well nourished, morbidly obese in no acute distress Head: Eyes PERRLA, No xanthomas.   Normal cephalic and atramatic  Lungs: Right middle to lower lobe crackles, clear on the left. No wheezes are noted. No cough. Heart: HRRR S1 S2, distant without MRG.  Pulses are 2+ & equal.            No carotid bruit. No JVD, neck obese, difficult to assess well.  No abdominal bruits. No femoral bruits. Abdomen: Bowel sounds are positive, abdomen soft and non-tender without masses or                  Hernia's noted. Msk:  Back normal, normal gait. Normal strength and tone for age. Extremities: No clubbing, cyanosis 2+edema bilateral.  DP +1 Neuro: Alert and oriented X 3. Psych:  Good affect, responds appropriately    ASSESSMENT AND PLAN

## 2011-10-15 NOTE — Assessment & Plan Note (Signed)
Moderately elevated. Will re-evaluate once he begins to diurese.

## 2011-10-15 NOTE — Assessment & Plan Note (Signed)
I am concerned about 18 lb wt gain with discontinuation of torsemide. He is also not compliant with low sodium diet. He is also asked to increase fluid intake during chemo. His breathing status is worsened with associated edema. I have discussed this with Dr. Domenic Polite on site. We have decided not to admit him, but to restart back torsemide 40 mg daily and restart coreg at a lower dose, 3.125 mg BID along with potassium 10 mEq. I will check a BNP, CXR. He will have BMET checked in a week. If he does not begin to lose some of the weight will have to have discussion with oncology concerning fluid balance issues with systolic dysfunction. He will see [place] again in one week.

## 2011-10-16 ENCOUNTER — Encounter (HOSPITAL_BASED_OUTPATIENT_CLINIC_OR_DEPARTMENT_OTHER): Payer: PRIVATE HEALTH INSURANCE

## 2011-10-16 VITALS — BP 131/75 | HR 134 | Temp 97.8°F

## 2011-10-16 DIAGNOSIS — C9000 Multiple myeloma not having achieved remission: Secondary | ICD-10-CM

## 2011-10-16 DIAGNOSIS — Z5112 Encounter for antineoplastic immunotherapy: Secondary | ICD-10-CM

## 2011-10-16 DIAGNOSIS — R197 Diarrhea, unspecified: Secondary | ICD-10-CM

## 2011-10-16 MED ORDER — BORTEZOMIB CHEMO SQ INJECTION 3.5 MG (2.5MG/ML)
1.1700 mg/m2 | Freq: Once | INTRAMUSCULAR | Status: AC
  Administered 2011-10-16: 3 mg via SUBCUTANEOUS
  Filled 2011-10-16: qty 1.2

## 2011-10-16 NOTE — Progress Notes (Signed)
[name] presents today for injection per MD orders. Velcade 3mg  administered SQ in left Abdomen. Administration without incident. Patient tolerated well.

## 2011-10-17 ENCOUNTER — Telehealth: Payer: Self-pay | Admitting: *Deleted

## 2011-10-17 NOTE — Telephone Encounter (Signed)
Patient states a 4 pound weight loss since Wednesday.  Advised by oncologist to continue at least 64 ounces of fluid due to treatments.  Will contact him on Monday to reassess progress.

## 2011-10-20 ENCOUNTER — Telehealth: Payer: Self-pay

## 2011-10-20 ENCOUNTER — Other Ambulatory Visit: Payer: Self-pay | Admitting: Adult Health

## 2011-10-20 NOTE — Telephone Encounter (Signed)
**Note De-Identified  Obfuscation** Pt. states he has lost 4 lbs. since Friday (Friday's weight was 390 lbs. and this morning his weight is 386 lbs.) Also, pt. reminded to have labs drawn today, he verbalized understanding./LV

## 2011-10-21 ENCOUNTER — Ambulatory Visit (INDEPENDENT_AMBULATORY_CARE_PROVIDER_SITE_OTHER): Payer: PRIVATE HEALTH INSURANCE | Admitting: Adult Health

## 2011-10-21 ENCOUNTER — Encounter (HOSPITAL_BASED_OUTPATIENT_CLINIC_OR_DEPARTMENT_OTHER): Payer: PRIVATE HEALTH INSURANCE | Admitting: Oncology

## 2011-10-21 ENCOUNTER — Encounter: Payer: Self-pay | Admitting: Adult Health

## 2011-10-21 ENCOUNTER — Telehealth: Payer: Self-pay

## 2011-10-21 VITALS — BP 130/70 | HR 92 | Ht 75.0 in | Wt 379.0 lb

## 2011-10-21 DIAGNOSIS — Z862 Personal history of diseases of the blood and blood-forming organs and certain disorders involving the immune mechanism: Secondary | ICD-10-CM

## 2011-10-21 DIAGNOSIS — M79673 Pain in unspecified foot: Secondary | ICD-10-CM

## 2011-10-21 DIAGNOSIS — Z8639 Personal history of other endocrine, nutritional and metabolic disease: Secondary | ICD-10-CM

## 2011-10-21 DIAGNOSIS — M79609 Pain in unspecified limb: Secondary | ICD-10-CM

## 2011-10-21 DIAGNOSIS — I519 Heart disease, unspecified: Secondary | ICD-10-CM

## 2011-10-21 LAB — BASIC METABOLIC PANEL
BUN: 26 mg/dL — ABNORMAL HIGH (ref 6–23)
CO2: 18 mEq/L — ABNORMAL LOW (ref 19–32)
Calcium: 7.7 mg/dL — ABNORMAL LOW (ref 8.4–10.5)
Chloride: 115 mEq/L — ABNORMAL HIGH (ref 96–112)
Creat: 1.6 mg/dL — ABNORMAL HIGH (ref 0.50–1.35)
Glucose, Bld: 105 mg/dL — ABNORMAL HIGH (ref 70–99)
Potassium: 4 mEq/L (ref 3.5–5.3)
Sodium: 143 mEq/L (ref 135–145)

## 2011-10-21 NOTE — Telephone Encounter (Signed)
**Note De-Identified  Obfuscation** Pt. is advised that we will contact him with date and time of stress test once Dr. [name] is ready, pt. Verbalized understanding./LV

## 2011-10-21 NOTE — Assessment & Plan Note (Signed)
He is doing much better with reinstitution of torsemide. His creatinine is 1.60, Potassium of 4.0. He is less edematous and is breathing better. I have spoken with oncology today who are in agreement with diuretic plan fluid restriction of 64 oz. There is a plan in the works to have him seen at [facility] for bone marrow transplant at some point in the future. He will need stress test. They will notify us when this is necessary timing-wise. He will be seen in 6 months.

## 2011-10-21 NOTE — Progress Notes (Signed)
HPI: [name] is a 51 y/o morbidly obese patient of Dr.Rothbart we are following for non-ischemic cardiomyopathy, EF of 35%-40% per echo in March of 2013, CAD, hypertension, with known history of multiple myeloma.  He was recently admitted to [facility] for diarrhea, renal failure and dehydration secondary to chemo-therapy. He was hydrated and taken off of torsemide, coreg,and ramipril. On last office visit, he had a weight gain of 18 lbs. He is having LEE and abdominal distention. He denies PND, orthopnea, or chest pressure. He admits to some dietary indiscretion eating canned foods and eating out. He continues on chemo and is also asked to drink in 1-3 liters a day of fluid per oncology. Recent labs have been completed on 10/03/2011 results: Creatinine 1.67 (1.81 on discharge), K+ 4.1, GFR 53 (48 on discharge).     I restarted his toresemide 40 mg daily, along with coreg 3.125 mg BID. He was told by oncology to drink 100 0z of water a day to "flush kidneys on chemo." We negotiated and he was changed to 64 oz a day. Since last visit, he has dropped 15 lbs and is asymptomatic. He is feeling a lot less sluggish, has no complaints of edema.BP is much better.  No Known Allergies  Current Outpatient Prescriptions  Medication Sig Dispense Refill  . acyclovir (ZOVIRAX) 400 MG tablet Take 400 mg by mouth every morning. To start Jul 07, 2011 (to take daily while on chemo)      . allopurinol (ZYLOPRIM) 300 MG tablet Take 300 mg by mouth daily.      Marland Kitchen aspirin EC 325 MG tablet Take 325 mg by mouth every morning.       . bortezomib IV (VELCADE) 3.5 MG injection Inject 1.3 mg/m2 into the vein every 21 ( twenty-one) days. To start Jul 11, 2011      . carvedilol (COREG) 3.125 MG tablet Take 1 tablet (3.125 mg total) by mouth 2 (two) times daily.  60 tablet  6  . Cholecalciferol (VITAMIN D) 2000 UNITS CAPS Take 1 capsule by mouth every morning.       Marland Kitchen dexamethasone (DECADRON) 4 MG tablet Take 5 decadron tablets BID every  Friday! Start Friday Feb 8th.  80 tablet  4  . dexlansoprazole (DEXILANT) 60 MG capsule Take 60 mg by mouth every morning.       . insulin aspart (NOVOLOG) 100 UNIT/ML injection Inject into the skin as needed.      . insulin glargine (LANTUS) 100 UNIT/ML injection Inject 60 Units into the skin daily before breakfast.  10 mL    . L-Methylfolate-B6-B12 (METANX PO) Take 1 tablet by mouth every morning.       Marland Kitchen lenalidomide ([name]) 25 MG capsule Take 25 mg by mouth See admin instructions. Take one tablet daily every day for 14 days of each month, then STOP for 7 days, then repeat.      . lidocaine-prilocaine (EMLA) cream Apply 1 application topically as needed. To port site. Apply 1 hour prior to treatment. Do not rub in. Cover with plastic.      . Liraglutide (VICTOZA) 18 MG/3ML SOLN Inject 1.2 mg into the skin every evening.       . loratadine (CLARITIN) 10 MG tablet Take 10 mg by mouth every morning.       Marland Kitchen LORazepam (ATIVAN) 1 MG tablet Take 1 mg by mouth every 4 (four) hours as needed. Nausea/vomiting      . Multiple Vitamins-Minerals (MULTIVITAMINS THER. W/MINERALS) TABS  Take 1 tablet by mouth every morning.        . ondansetron (ZOFRAN) 8 MG tablet Take 8 mg by mouth 2 (two) times daily as needed. Nausea/vomiting. Patient to also take this 30 minutes to 1 hour prior to Velcade injections.      . potassium chloride (K-DUR,KLOR-CON) 10 MEQ tablet Take 10 mEq by mouth every morning.       . prochlorperazine (COMPAZINE) 10 MG tablet Take 10 mg by mouth every 6 (six) hours as needed.      . rosuvastatin (CRESTOR) 10 MG tablet Take 10 mg by mouth every morning.       . sulfamethoxazole-trimethoprim (BACTRIM DS,SEPTRA DS) 800-160 MG per tablet Take 1 tablet by mouth 3 (three) times a week. To start Jul 07, 2011 (to take MWF while on chemo)      . Tadalafil (CIALIS PO) Take by mouth as needed.        . Zoledronic Acid (ZOMETA IV) Inject 2 mg into the vein every 28 (twenty-eight) days. To start Jul 11, 2011        Past Medical History  Diagnosis Date  . Arteriosclerotic cardiovascular disease (ASCVD)     MI-2000s; stent to the proximal LAD and diagonal in 2001; stress nuclear in 2008-impaired exercise capacity, left ventricular dilatation, moderately to severely depressed EF, apical, inferior and anteroseptal scar  . Sleep apnea   . Hypertension   . 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 Multiple myeloma 07/01/2011  . Diabetes mellitus     Insulin  . GERD (gastroesophageal reflux disease)   . Pedal edema     Venous insufficiency  . Obesity   . Hyperlipidemia   . Chronic kidney disease, stage 3, mod decreased GFR     Creatinine of 1.84 in 06/2011 and 1.5 in 07/2011  . Injection Site Reaction     Past Surgical History  Procedure Date  . Laparoscopic gastric banding 2006  . Wrist surgery     Left; removal of bone fragment  . Incision and drainage abscess anal   . Abscess drainage     Scrotal  . Bone marrow biopsy 05/13/11  . Portacath placement 07/07/2011    Procedure: INSERTION PORT-A-CATH;  Surgeon: Scherry Ran, MD;  Location: [facility];  Service: General;  Laterality: N/A;    [id number] of systems complete and found to be negative unless listed above  PHYSICAL EXAM BP 130/70  Pulse 92  Ht 6\' 3"  (1.905 m)  Wt 379 lb (171.913 kg)  BMI 47.37 kg/m2  SpO2 96%  General: Well developed, well nourished, morbidly obese in no acute distress Head: Eyes PERRLA, No xanthomas.   Normal cephalic and atramatic  Lungs: Clear No wheezes are noted. No cough. Heart: HRRR S1 S2, distant without MRG.  Pulses are 2+ & equal.            No carotid bruit. No JVD, neck obese, difficult to assess well.  No abdominal bruits. No femoral bruits. Abdomen: Bowel sounds are positive, abdomen soft and non-tender without masses or                  Hernia's noted. Msk:  Back normal, normal gait. Normal strength and tone for age. Extremities: No clubbing, cyanosis no edema.  DP +1 Neuro: Alert and  oriented X 3. Psych:  Good affect, responds appropriately    ASSESSMENT AND PLAN

## 2011-10-21 NOTE — Patient Instructions (Signed)
**Note De-identified  Obfuscation** Your physician recommends that you continue on your current medications as directed. Please refer to the Current Medication list given to you today.  Your physician recommends that you schedule a follow-up appointment in: 6 months  

## 2011-10-22 NOTE — Progress Notes (Signed)
Patient is seen as a walk-in today.  He complains of right sole of foot "pressure" pain.  Walking and putting weight on his right foot increases the pain and a lack of weight bearing eliminates the pain.  The pain does not radiation.  It began on Sunday.  He denies any trauma or injury.  It has not gotten worse, but is the stable.  He does admit that he has been "sliding" out of his [place] SUV and this repeated trauma to his foot may be the cause of the discomfort.  Physical exam is unremarkable.  He does not not have increased swelling.  No heat or erythema.  Positive tenderness of the sole of right foot at the posterior area of arch of foot and anterior portion of calcaneous. No lesions or rash.  No injuries appreciated.   So we have a morbidly obese gentleman with "pressure" like pain of the right sole of foot.  I have asked him to take his Hydrocodone pain medication as directed.  He is to report on Friday how his foot feels. I cannot fully explain his discomfort at this point in time.  It could be early gout (he has a positive history for it), but not physical exam findings indicate gout at this time.   [name]

## 2011-10-23 ENCOUNTER — Ambulatory Visit (HOSPITAL_COMMUNITY): Payer: PRIVATE HEALTH INSURANCE | Admitting: Oncology

## 2011-10-24 ENCOUNTER — Ambulatory Visit (HOSPITAL_COMMUNITY): Payer: PRIVATE HEALTH INSURANCE | Admitting: Oncology

## 2011-10-24 ENCOUNTER — Encounter (HOSPITAL_BASED_OUTPATIENT_CLINIC_OR_DEPARTMENT_OTHER): Payer: PRIVATE HEALTH INSURANCE | Admitting: Oncology

## 2011-10-24 DIAGNOSIS — K219 Gastro-esophageal reflux disease without esophagitis: Secondary | ICD-10-CM

## 2011-10-24 DIAGNOSIS — E119 Type 2 diabetes mellitus without complications: Secondary | ICD-10-CM

## 2011-10-24 DIAGNOSIS — C9000 Multiple myeloma not having achieved remission: Secondary | ICD-10-CM

## 2011-10-24 DIAGNOSIS — R197 Diarrhea, unspecified: Secondary | ICD-10-CM

## 2011-10-24 NOTE — Patient Instructions (Signed)
[name]  [id number] 04/15/1961 Dr. Everardo All   [facility]  Discharge Instructions  RECOMMENDATIONS MADE BY THE CONSULTANT AND ANY TEST RESULTS WILL BE SENT TO YOUR REFERRING DOCTOR.   EXAM FINDINGS BY MD TODAY AND SIGNS AND SYMPTOMS TO REPORT TO CLINIC OR PRIMARY MD: discussion per MD  MEDICATIONS PRESCRIBED: lomotil 2.5 mg Follow label directions  INSTRUCTIONS GIVEN AND DISCUSSED: Other :  Report fevers, chills, uncontrolled nausea, vomiting or pain.  SPECIAL INSTRUCTIONS/FOLLOW-UP: Lab work Needed with next chemotherapy and Return to Clinic on 10/28/11   I acknowledge that I have been informed and understand all the instructions given to me and received a copy. I do not have any more questions at this time, but understand that I may call the Specialty Clinic at [facility] at [phone] during business hours should I have any further questions or need assistance in obtaining follow-up care.    __________________________________________  _____________  __________ Signature of Patient or Authorized Representative            Date                   Time    __________________________________________ Nurse's Signature

## 2011-10-24 NOTE — Progress Notes (Signed)
CC:   Franklin Folk. Hill, MD  DIAGNOSES: 1. Kappa light chain multiple myeloma, Stage II disease with a beta 2     microglobulin level less than 3.5, albumin 3.4, and kappa light     chain disease.  his present therapy consists of [name] 25 mg, 14     days on and 7 days off; bortezomib; and he is on Decadron weekly 20     mg b.i.d., and that is on Fridays. 2. atherosclerotic cardiovascular disease with a myocardial infarction     in 2000, stents in the left anterior descending artery and diagonal     in 2001. 3. Sleep apnea syndrome. 4. Morbid obesity. weighing at one time over 500 pounds, now weighing     377 pounds.  He weighed approximately 340 pounds when he started     this therapy and had this diagnosis made. 5. Diabetes mellitus. 6. Gastroesophageal reflux disease. 7. History of gout. 8. Hyperlipidemia.  MEDICATIONS:  I have reviewed today.  They are in the chart.  He is on allopurinol.  Has not had a recurrence of his gout since the initial treatment and placement on allopurinol.  [name] saw both Dr. [name] in consultation as well as Dr. Clydene Laming.  Dr. [name] is at [facility], Dr. Clydene Laming is at [facility].  It would be tough to transplant him.  It sounds like he would be at increased risk because of his comorbidities and his weight.  But he has responded.  His kappa free light chains have dropped from 120 most recently to 4.69 mg/dL.  His kappa to lambda light chain ratio has dropped from over 100 to less than 1 now.  His initial value was 123.71, and his most recent value on the 3rd of May was 0.99.  Labs are due on the 27th of May.  His hemoglobin is mildly low at 11 g.  White count, platelets are very adequate.  His glucoses, he states, are under better control, and he is really trying to lose weight.  His big complaint is he has diarrhea, sometimes 3-4 very, very loose, high-volume stools a day.  It got a whole lot better with the reduction in the [name] dose, but it did not  go away.  We are going to stop the [name] indefinitely and see if that is the culprit.  If it is and it goes away completely, we will give him back a lower dose, somewhere around 15 mg 14 days on and 7 days off.  Otherwise, [name] does not look bad.  He is in no acute distress.  His right foot has been a little tender and he saw my PA, [name], this week, but again I agree with [name], it is probably trauma-induced from his weight.  He bears most of his weight when he gets out of his vehicle on this right foot.  It is the midportion of the right foot on the plantar surface.  There is no inflammation there, no heat, no point tenderness presently, and he states if he stands around, he is always leaning more on his right foot, etc.  His abdomen, of course, is obese.  Bowel sounds are slightly hyperactive today.  Lungs, though, are clear.  Heart shows a regular rhythm and rate.  His vital signs otherwise show that his weight is 377 pounds, blood pressure is 111/70 left arm sitting position.  His temperature is normal.  Respirations 18 and unlabored.  Pulse right around 92 and regular.  He has a Port-A-Cath which is intact.  So I think [name] is doing pretty well.  I think he is about to embark on his 6th cycle and at the end of that, he will see [name], my PA, after blood work is drawn and we made need to get him back to Dr. Clydene Laming for consideration of transplant should he be continuing to be at least stable.  I will add back the [name] only if he gets better,  if he does not get better from the diarrhea standpoint in spite of negative cultures, we may have to rethink our position on the bortezomib, change that, and we will add back the [name] at some point.  I am hoping that he might be a transplant candidate, though with some of these new agents it might be better to try to control him with those if he is not felt to be an excellent bone marrow transplant  candidate.    ______________________________ [name]. [name], MD ESN/MEDQ  D:  10/24/2011  T:  10/24/2011  Job:  [id number]

## 2011-10-28 ENCOUNTER — Encounter (HOSPITAL_BASED_OUTPATIENT_CLINIC_OR_DEPARTMENT_OTHER): Payer: PRIVATE HEALTH INSURANCE

## 2011-10-28 DIAGNOSIS — Z5112 Encounter for antineoplastic immunotherapy: Secondary | ICD-10-CM

## 2011-10-28 DIAGNOSIS — C9000 Multiple myeloma not having achieved remission: Secondary | ICD-10-CM

## 2011-10-28 DIAGNOSIS — R197 Diarrhea, unspecified: Secondary | ICD-10-CM

## 2011-10-28 LAB — DIFFERENTIAL
Basophils Absolute: 0 10*3/uL (ref 0.0–0.1)
Basophils Relative: 0 % (ref 0–1)
Eosinophils Absolute: 0.1 10*3/uL (ref 0.0–0.7)
Eosinophils Relative: 1 % (ref 0–5)
Lymphocytes Relative: 23 % (ref 12–46)
Lymphs Abs: 2.3 10*3/uL (ref 0.7–4.0)
Monocytes Absolute: 1.2 10*3/uL — ABNORMAL HIGH (ref 0.1–1.0)
Monocytes Relative: 13 % — ABNORMAL HIGH (ref 3–12)
Neutro Abs: 6 10*3/uL (ref 1.7–7.7)
Neutrophils Relative %: 62 % (ref 43–77)

## 2011-10-28 LAB — COMPREHENSIVE METABOLIC PANEL
ALT: 25 U/L (ref 0–53)
AST: 17 U/L (ref 0–37)
Albumin: 2.8 g/dL — ABNORMAL LOW (ref 3.5–5.2)
Alkaline Phosphatase: 71 U/L (ref 39–117)
BUN: 40 mg/dL — ABNORMAL HIGH (ref 6–23)
CO2: 20 mEq/L (ref 19–32)
Calcium: 8.5 mg/dL (ref 8.4–10.5)
Chloride: 107 mEq/L (ref 96–112)
Creatinine, Ser: 1.5 mg/dL — ABNORMAL HIGH (ref 0.50–1.35)
GFR calc Af Amer: 60 mL/min — ABNORMAL LOW (ref >90)
GFR calc non Af Amer: 52 mL/min — ABNORMAL LOW (ref >90)
Glucose, Bld: 116 mg/dL — ABNORMAL HIGH (ref 70–99)
Potassium: 3.3 mEq/L — ABNORMAL LOW (ref 3.5–5.1)
Sodium: 139 mEq/L (ref 135–145)
Total Bilirubin: 0.5 mg/dL (ref 0.3–1.2)
Total Protein: 5.7 g/dL — ABNORMAL LOW (ref 6.0–8.3)

## 2011-10-28 LAB — CBC
HCT: 27.8 % — ABNORMAL LOW (ref 39.0–52.0)
Hemoglobin: 9.6 g/dL — ABNORMAL LOW (ref 13.0–17.0)
MCH: 30.7 pg (ref 26.0–34.0)
MCHC: 34.5 g/dL (ref 30.0–36.0)
MCV: 88.8 fL (ref 78.0–100.0)
Platelets: 246 10*3/uL (ref 150–400)
RBC: 3.13 MIL/uL — ABNORMAL LOW (ref 4.22–5.81)
RDW: 17.2 % — ABNORMAL HIGH (ref 11.5–15.5)
WBC: 9.7 10*3/uL (ref 4.0–10.5)

## 2011-10-28 MED ORDER — HEPARIN SOD (PORK) LOCK FLUSH 100 UNIT/ML IV SOLN
INTRAVENOUS | Status: AC
  Filled 2011-10-28: qty 5

## 2011-10-28 MED ORDER — ONDANSETRON HCL 4 MG PO TABS
8.0000 mg | ORAL_TABLET | Freq: Once | ORAL | Status: AC
  Administered 2011-10-28: 8 mg via ORAL

## 2011-10-28 MED ORDER — BORTEZOMIB CHEMO SQ INJECTION 3.5 MG (2.5MG/ML)
1.1700 mg/m2 | Freq: Once | INTRAMUSCULAR | Status: AC
  Administered 2011-10-28: 3 mg via SUBCUTANEOUS
  Filled 2011-10-28: qty 1.2

## 2011-10-28 MED ORDER — ONDANSETRON HCL 4 MG PO TABS
ORAL_TABLET | ORAL | Status: AC
  Filled 2011-10-28: qty 2

## 2011-10-28 MED ORDER — SODIUM CHLORIDE 0.9 % IJ SOLN
INTRAMUSCULAR | Status: AC
  Filled 2011-10-28: qty 10

## 2011-10-28 NOTE — Progress Notes (Signed)
Tolerated velcade inj. Well.

## 2011-10-28 NOTE — Progress Notes (Signed)
Port accessed and specimen obtained for lab. Tolerated well.

## 2011-10-29 ENCOUNTER — Other Ambulatory Visit (HOSPITAL_COMMUNITY): Payer: Self-pay

## 2011-10-29 LAB — KAPPA/LAMBDA LIGHT CHAINS
Kappa free light chain: 1.52 mg/dL (ref 0.33–1.94)
Kappa, lambda light chain ratio: 1.32 (ref 0.26–1.65)
Lambda free light chains: 1.15 mg/dL (ref 0.57–2.63)

## 2011-10-30 LAB — MULTIPLE MYELOMA PANEL, SERUM
Albumin ELP: 53.9 % — ABNORMAL LOW (ref 55.8–66.1)
Alpha-1-Globulin: 9.4 % — ABNORMAL HIGH (ref 2.9–4.9)
Alpha-2-Globulin: 16.2 % — ABNORMAL HIGH (ref 7.1–11.8)
Beta 2: 6.7 % — ABNORMAL HIGH (ref 3.2–6.5)
Beta Globulin: 6 % (ref 4.7–7.2)
Gamma Globulin: 7.8 % — ABNORMAL LOW (ref 11.1–18.8)
IgA: 67 mg/dL — ABNORMAL LOW (ref 68–379)
IgG (Immunoglobin G), Serum: 381 mg/dL — ABNORMAL LOW (ref 650–1600)
IgM, Serum: 56 mg/dL — ABNORMAL LOW (ref 41–251)
M-Spike, %: NOT DETECTED g/dL
Total Protein: 5 g/dL — ABNORMAL LOW (ref 6.0–8.3)

## 2011-10-31 ENCOUNTER — Encounter (HOSPITAL_BASED_OUTPATIENT_CLINIC_OR_DEPARTMENT_OTHER): Payer: PRIVATE HEALTH INSURANCE

## 2011-10-31 ENCOUNTER — Other Ambulatory Visit (HOSPITAL_COMMUNITY): Payer: Self-pay | Admitting: Oncology

## 2011-10-31 VITALS — BP 120/82 | HR 80 | Temp 98.1°F | Wt 372.4 lb

## 2011-10-31 DIAGNOSIS — C9000 Multiple myeloma not having achieved remission: Secondary | ICD-10-CM

## 2011-10-31 DIAGNOSIS — R197 Diarrhea, unspecified: Secondary | ICD-10-CM

## 2011-10-31 DIAGNOSIS — Z5112 Encounter for antineoplastic immunotherapy: Secondary | ICD-10-CM

## 2011-10-31 MED ORDER — BORTEZOMIB CHEMO SQ INJECTION 3.5 MG (2.5MG/ML)
1.1700 mg/m2 | Freq: Once | INTRAMUSCULAR | Status: AC
  Administered 2011-10-31: 3 mg via SUBCUTANEOUS
  Filled 2011-10-31: qty 1.2

## 2011-10-31 NOTE — Progress Notes (Addendum)
Margrett Rud Gearin tolerated injection well and without incident; states he took his zofran just pta to [facility].  Patient verbalizes understanding for follow-up.  No distress noted at time of discharge and patient was discharged home by himself.  Patient also reported continued diarrhea and right foot pain, and he spoke with Dr. [name] about same - was advised to hold revlimid x 2 weeks but to continue the decadron.  Patient verbalized understanding of instructions.  Scheduling communication sent to [facility] to arrange subsequent chemos.

## 2011-11-04 ENCOUNTER — Encounter (HOSPITAL_COMMUNITY): Payer: PRIVATE HEALTH INSURANCE | Attending: Oncology

## 2011-11-04 ENCOUNTER — Encounter (HOSPITAL_BASED_OUTPATIENT_CLINIC_OR_DEPARTMENT_OTHER): Payer: PRIVATE HEALTH INSURANCE | Admitting: Oncology

## 2011-11-04 ENCOUNTER — Other Ambulatory Visit (HOSPITAL_COMMUNITY): Payer: Self-pay | Admitting: Oncology

## 2011-11-04 DIAGNOSIS — R197 Diarrhea, unspecified: Secondary | ICD-10-CM

## 2011-11-04 DIAGNOSIS — Z5112 Encounter for antineoplastic immunotherapy: Secondary | ICD-10-CM

## 2011-11-04 DIAGNOSIS — C9000 Multiple myeloma not having achieved remission: Secondary | ICD-10-CM

## 2011-11-04 MED ORDER — BORTEZOMIB CHEMO SQ INJECTION 3.5 MG (2.5MG/ML)
1.1700 mg/m2 | Freq: Once | INTRAMUSCULAR | Status: AC
  Administered 2011-11-04: 3 mg via SUBCUTANEOUS
  Filled 2011-11-04: qty 1.2

## 2011-11-04 NOTE — Progress Notes (Signed)
Tolerated injection well. 

## 2011-11-05 NOTE — Progress Notes (Signed)
Patient is here today for Velcade injection.  His [name] is on hold due to diarrhea.  Today the patient reports that his stool is a little more formed than when he was on the [name].  He is averaging 3 BMs daily.  He reports that yesterday they were watery without any form.  Today they are minimally improved with some form.  We discussed the options including continuing Velcade and Dexamethasone and keeping the [name] on hold for another week or so and see how his bowels respond or holding all therapy (except for the Dexamethasone) for 1-2 weeks and see if his loose stool improve.  He has decided to continue to hold the [name] and continue with Velcade and Dexamethasone and re-evaluate in 1 week.  I agree with this plan.   [name]

## 2011-11-07 ENCOUNTER — Encounter (HOSPITAL_BASED_OUTPATIENT_CLINIC_OR_DEPARTMENT_OTHER): Payer: PRIVATE HEALTH INSURANCE

## 2011-11-07 VITALS — BP 129/83 | HR 77 | Temp 98.2°F

## 2011-11-07 DIAGNOSIS — C9000 Multiple myeloma not having achieved remission: Secondary | ICD-10-CM

## 2011-11-07 DIAGNOSIS — Z5112 Encounter for antineoplastic immunotherapy: Secondary | ICD-10-CM

## 2011-11-07 MED ORDER — HEPARIN SOD (PORK) LOCK FLUSH 100 UNIT/ML IV SOLN
500.0000 [IU] | Freq: Once | INTRAVENOUS | Status: AC
  Administered 2011-11-07: 500 [IU] via INTRAVENOUS
  Filled 2011-11-07: qty 5

## 2011-11-07 MED ORDER — SODIUM CHLORIDE 0.9 % IJ SOLN
10.0000 mL | Freq: Once | INTRAMUSCULAR | Status: AC
  Administered 2011-11-07: 10 mL via INTRAVENOUS
  Filled 2011-11-07: qty 10

## 2011-11-07 MED ORDER — SODIUM CHLORIDE 0.9 % IV SOLN
INTRAVENOUS | Status: DC
  Administered 2011-11-07: 15:00:00 via INTRAVENOUS

## 2011-11-07 MED ORDER — ZOLEDRONIC ACID 4 MG/5ML IV CONC
2.0000 mg | Freq: Once | INTRAVENOUS | Status: AC
  Administered 2011-11-07: 2 mg via INTRAVENOUS
  Filled 2011-11-07: qty 2.5

## 2011-11-07 MED ORDER — BORTEZOMIB CHEMO SQ INJECTION 3.5 MG (2.5MG/ML)
1.1700 mg/m2 | Freq: Once | INTRAMUSCULAR | Status: AC
  Administered 2011-11-07: 3 mg via SUBCUTANEOUS
  Filled 2011-11-07: qty 1.2

## 2011-11-07 MED ORDER — HEPARIN SOD (PORK) LOCK FLUSH 100 UNIT/ML IV SOLN
INTRAVENOUS | Status: AC
  Filled 2011-11-07: qty 5

## 2011-11-07 NOTE — Progress Notes (Signed)
[name] presents today for injection per MD orders.  zometa administered IV per protocol.  Administration without incident. Patient tolerated well. Talked with T. Sheldon Silvan PA re: continued diarrhea. Will continue to hold revlimid.  velcade due today for Cycle VI day 11. [name] presents today for injection per MD orders. Velcade  administered SQ in left Abdomen. Administration without incident. Patient tolerated well.

## 2011-11-10 ENCOUNTER — Other Ambulatory Visit (HOSPITAL_COMMUNITY): Payer: Self-pay | Admitting: Oncology

## 2011-11-10 DIAGNOSIS — C9000 Multiple myeloma not having achieved remission: Secondary | ICD-10-CM

## 2011-11-10 MED ORDER — ACYCLOVIR 400 MG PO TABS: 400.0000 mg | ORAL_TABLET | ORAL | Status: DC

## 2011-11-13 ENCOUNTER — Encounter: Payer: Self-pay | Admitting: Oncology

## 2011-11-17 ENCOUNTER — Other Ambulatory Visit (HOSPITAL_COMMUNITY): Payer: Self-pay | Admitting: Oncology

## 2011-11-17 ENCOUNTER — Encounter (HOSPITAL_COMMUNITY): Payer: PRIVATE HEALTH INSURANCE

## 2011-11-17 DIAGNOSIS — C9000 Multiple myeloma not having achieved remission: Secondary | ICD-10-CM

## 2011-11-17 LAB — CBC
HCT: 30.7 % — ABNORMAL LOW (ref 39.0–52.0)
Hemoglobin: 10.3 g/dL — ABNORMAL LOW (ref 13.0–17.0)
MCH: 30.9 pg (ref 26.0–34.0)
MCHC: 33.6 g/dL (ref 30.0–36.0)
MCV: 92.2 fL (ref 78.0–100.0)
Platelets: 222 10*3/uL (ref 150–400)
RBC: 3.33 MIL/uL — ABNORMAL LOW (ref 4.22–5.81)
RDW: 18.1 % — ABNORMAL HIGH (ref 11.5–15.5)
WBC: 10.2 10*3/uL (ref 4.0–10.5)

## 2011-11-17 LAB — COMPREHENSIVE METABOLIC PANEL
ALT: 59 U/L — ABNORMAL HIGH (ref 0–53)
AST: 45 U/L — ABNORMAL HIGH (ref 0–37)
Albumin: 3.3 g/dL — ABNORMAL LOW (ref 3.5–5.2)
Alkaline Phosphatase: 83 U/L (ref 39–117)
BUN: 31 mg/dL — ABNORMAL HIGH (ref 6–23)
CO2: 17 mEq/L — ABNORMAL LOW (ref 19–32)
Calcium: 9.1 mg/dL (ref 8.4–10.5)
Chloride: 109 mEq/L (ref 96–112)
Creatinine, Ser: 1.32 mg/dL (ref 0.50–1.35)
GFR calc Af Amer: 70 mL/min — ABNORMAL LOW (ref >90)
GFR calc non Af Amer: 61 mL/min — ABNORMAL LOW (ref >90)
Glucose, Bld: 108 mg/dL — ABNORMAL HIGH (ref 70–99)
Potassium: 3.6 mEq/L (ref 3.5–5.1)
Sodium: 139 mEq/L (ref 135–145)
Total Bilirubin: 0.4 mg/dL (ref 0.3–1.2)
Total Protein: 6.1 g/dL (ref 6.0–8.3)

## 2011-11-17 LAB — DIFFERENTIAL
Basophils Absolute: 0 10*3/uL (ref 0.0–0.1)
Basophils Relative: 0 % (ref 0–1)
Eosinophils Absolute: 0.2 10*3/uL (ref 0.0–0.7)
Eosinophils Relative: 2 % (ref 0–5)
Lymphocytes Relative: 32 % (ref 12–46)
Lymphs Abs: 3.3 10*3/uL (ref 0.7–4.0)
Monocytes Absolute: 0.7 10*3/uL (ref 0.1–1.0)
Monocytes Relative: 7 % (ref 3–12)
Neutro Abs: 6 10*3/uL (ref 1.7–7.7)
Neutrophils Relative %: 59 % (ref 43–77)

## 2011-11-17 MED ORDER — DEXAMETHASONE 4 MG PO TABS: ORAL_TABLET | ORAL | Status: DC

## 2011-11-17 NOTE — Progress Notes (Unsigned)
[name] presented for labwork. Labs per MD order drawn via Peripheral Line 25 gauge needle inserted in rt ac.  Good blood return present. Procedure without incident.  Needle removed intact. Patient tolerated procedure well. Discussed diarrhea with Dr. [name] and he talked with pt. decision made to hold velcade and get pt an appt with Dr. Laural Golden. [facility] faxed to [facility] and voice message left for them to call us with appt for this week.

## 2011-11-18 ENCOUNTER — Encounter (INDEPENDENT_AMBULATORY_CARE_PROVIDER_SITE_OTHER): Payer: Self-pay | Admitting: Internal Medicine

## 2011-11-18 ENCOUNTER — Other Ambulatory Visit (INDEPENDENT_AMBULATORY_CARE_PROVIDER_SITE_OTHER): Payer: Self-pay | Admitting: Internal Medicine

## 2011-11-18 ENCOUNTER — Ambulatory Visit (INDEPENDENT_AMBULATORY_CARE_PROVIDER_SITE_OTHER): Payer: PRIVATE HEALTH INSURANCE | Admitting: Internal Medicine

## 2011-11-18 VITALS — BP 112/60 | HR 80 | Temp 97.6°F | Ht 75.0 in | Wt 368.5 lb

## 2011-11-18 DIAGNOSIS — R197 Diarrhea, unspecified: Secondary | ICD-10-CM

## 2011-11-18 LAB — KAPPA/LAMBDA LIGHT CHAINS
Kappa free light chain: 0.85 mg/dL (ref 0.33–1.94)
Kappa, lambda light chain ratio: 1.16 (ref 0.26–1.65)
Lambda free light chains: 0.73 mg/dL (ref 0.57–2.63)

## 2011-11-18 NOTE — Patient Instructions (Addendum)
Stool studies.  , Lomotil 2 in am and 2 in pm.  If stools studies are negative: colonoscopy

## 2011-11-18 NOTE — Progress Notes (Addendum)
Subjective:     Patient ID: [name], male   DOB: May 03, 1961, 51 y.o.   MRN: [id number]  HPI Referred by Dr. [name] for diarrhea. He has severe diarrhea. His stools are very loose.  He has pain across his mid abdomen with and without BMs. He has had diarrhea since starting the chemotherapy.  He is having 2-3 stools a day.  Stools are very watery and large amounts. Diagnosed with multiple in December with multiple myeloma.  Presently taking chemo.  Appetite is good. Weight loss 40 pounds since December which intentional. No melena or bright red rectal bleeding. Last colonoscopy in [place] 3 yrs ago and normal. [name] on hold x 3 weeks. Velcade on hold x 1 week with no change in stools.     Before starting chemo, he was having 1 stools a day. 09/08/2011 C. Difficile: negative  09/2011 TSH 0.822 normal, T4 free: 1.29  Review of Systems see hpi. Current Outpatient Prescriptions  Medication Sig Dispense Refill  . acyclovir (ZOVIRAX) 400 MG tablet Take 1 tablet (400 mg total) by mouth every morning. To start Jul 07, 2011 (to take daily while on chemo)  30 tablet  1  . allopurinol (ZYLOPRIM) 300 MG tablet Take 300 mg by mouth daily.      Marland Kitchen aspirin EC 325 MG tablet Take 325 mg by mouth every morning.       . bortezomib IV (VELCADE) 3.5 MG injection Inject 1.3 mg/m2 into the vein every 21 ( twenty-one) days. To start Jul 11, 2011      . carvedilol (COREG) 3.125 MG tablet Take 1 tablet (3.125 mg total) by mouth 2 (two) times daily.  60 tablet  6  . Cholecalciferol (VITAMIN D) 2000 UNITS CAPS Take 1 capsule by mouth every morning.       Marland Kitchen dexamethasone (DECADRON) 4 MG tablet Take 5 decadron tablets BID every Friday.  80 tablet  4  . dexlansoprazole (DEXILANT) 60 MG capsule Take 60 mg by mouth every morning.       . diphenoxylate-atropine (LOMOTIL) 2.5-0.025 MG per tablet Take 1 tablet by mouth 4 (four) times daily as needed.      Marland Kitchen HYDROCODONE-ACETAMINOPHEN PO Take by mouth as needed.  Takes as needed for pain      . insulin aspart (NOVOLOG) 100 UNIT/ML injection Inject into the skin as needed.      . insulin glargine (LANTUS) 100 UNIT/ML injection Inject 60 Units into the skin daily before breakfast.  10 mL    . L-Methylfolate-B6-B12 (METANX PO) Take 1 tablet by mouth every morning.       Marland Kitchen lenalidomide ([name]) 25 MG capsule Take 25 mg by mouth See admin instructions. Take one tablet daily every day for 14 days of each month, then STOP for 7 days, then repeat.      . lidocaine-prilocaine (EMLA) cream Apply 1 application topically as needed. To port site. Apply 1 hour prior to treatment. Do not rub in. Cover with plastic.      . Liraglutide (VICTOZA) 18 MG/3ML SOLN Inject 1.2 mg into the skin every evening.       . loratadine (CLARITIN) 10 MG tablet Take 10 mg by mouth every morning.       Marland Kitchen LORazepam (ATIVAN) 1 MG tablet Take 1 mg by mouth every 4 (four) hours as needed. Nausea/vomiting      . NON FORMULARY Herbal life shake daily      . ondansetron (ZOFRAN) 8 MG tablet Take  8 mg by mouth 2 (two) times daily as needed. Nausea/vomiting. Patient to also take this 30 minutes to 1 hour prior to Velcade injections.      . potassium chloride (K-DUR,KLOR-CON) 10 MEQ tablet Take 10 mEq by mouth every morning.       . rosuvastatin (CRESTOR) 10 MG tablet Take 10 mg by mouth every morning.       . sulfamethoxazole-trimethoprim (BACTRIM DS,SEPTRA DS) 800-160 MG per tablet Take 1 tablet by mouth 3 (three) times a week. To start Jul 07, 2011 (to take MWF while on chemo)      . Tadalafil (CIALIS PO) Take by mouth as needed.        . Zoledronic Acid (ZOMETA IV) Inject 2 mg into the vein every 28 (twenty-eight) days. To start Jul 11, 2011      . Multiple Vitamins-Minerals (MULTIVITAMINS THER. W/MINERALS) TABS Take 1 tablet by mouth every morning.        . prochlorperazine (COMPAZINE) 10 MG tablet Take 10 mg by mouth every 6 (six) hours as needed.       Past Medical History  Diagnosis Date  .  Arteriosclerotic cardiovascular disease (ASCVD)     MI-2000s; stent to the proximal LAD and diagonal in 2001; stress nuclear in 2008-impaired exercise capacity, left ventricular dilatation, moderately to severely depressed EF, apical, inferior and anteroseptal scar  . Sleep apnea   . Hypertension   . 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 Multiple myeloma 07/01/2011  . Diabetes mellitus     Insulin  . GERD (gastroesophageal reflux disease)   . Pedal edema     Venous insufficiency  . Obesity   . Hyperlipidemia   . Chronic kidney disease, stage 3, mod decreased GFR     Creatinine of 1.84 in 06/2011 and 1.5 in 07/2011  . Injection Site Reaction    Past Surgical History  Procedure Date  . Laparoscopic gastric banding 2006  . Wrist surgery     Left; removal of bone fragment  . Incision and drainage abscess anal   . Abscess drainage     Scrotal  . Bone marrow biopsy 05/13/11  . Portacath placement 07/07/2011    Procedure: INSERTION PORT-A-CATH;  Surgeon: Scherry Ran, MD;  Location: [facility];  Service: General;  Laterality: N/A;   History   Social History  . Marital Status: Married    Spouse Name: N/A    Number of Children: N/A  . Years of Education: N/A   Occupational History  . Funeral Director    Social History Main Topics  . Smoking status: Former Smoker    Types: Cigarettes, Cigars    Quit date: 05/17/2001  . Smokeless tobacco: Never Used  . Alcohol Use: No  . Drug Use: No  . Sexually Active: Yes    Birth Control/ Protection: None   Other Topics Concern  . Not on file   Social History Narrative  . No narrative on file   Family Status  Relation Status Death Age  . Mother Deceased   . Father Deceased    No Known Allergies      Objective:   Physical Exam Filed Vitals:   11/18/11 1008  Height: 6\' 3"  (1.905 m)  Weight: 368 lb 8 oz (167.151 kg)   Alert and oriented. Skin warm and dry. Oral mucosa is moist.   . Sclera anicteric, conjunctivae is pink.  Thyroid not enlarged. No cervical lymphadenopathy. Lungs clear. Heart regular rate and rhythm.  Abdomen is soft.  Bowel sounds are positive.  No abdominal masses felt. Some abdominal tenderness  No edema to lower extremities.        Assessment:    Diarrhea probably chemo induced.  Dr. Laural Golden in with patient and interviewed patient    Plan:    stool studies,   Lomotil 2 in am and 2 at lunch.  Stool culture, c-difficile, WBC. If stool studies are negative: colonoscopy

## 2011-11-19 LAB — FECAL LACTOFERRIN, QUANT: Lactoferrin: POSITIVE

## 2011-11-19 LAB — MULTIPLE MYELOMA PANEL, SERUM
Albumin ELP: 57.1 % (ref 55.8–66.1)
Alpha-1-Globulin: 7.6 % — ABNORMAL HIGH (ref 2.9–4.9)
Alpha-2-Globulin: 14.8 % — ABNORMAL HIGH (ref 7.1–11.8)
Beta 2: 6 % (ref 3.2–6.5)
Beta Globulin: 6.6 % (ref 4.7–7.2)
Gamma Globulin: 7.9 % — ABNORMAL LOW (ref 11.1–18.8)
IgA: 69 mg/dL (ref 68–379)
IgG (Immunoglobin G), Serum: 466 mg/dL — ABNORMAL LOW (ref 650–1600)
IgM, Serum: 49 mg/dL — ABNORMAL LOW (ref 41–251)
M-Spike, %: NOT DETECTED g/dL
Total Protein: 5.6 g/dL — ABNORMAL LOW (ref 6.0–8.3)

## 2011-11-20 ENCOUNTER — Inpatient Hospital Stay (HOSPITAL_COMMUNITY): Payer: PRIVATE HEALTH INSURANCE

## 2011-11-20 ENCOUNTER — Ambulatory Visit (HOSPITAL_COMMUNITY): Payer: PRIVATE HEALTH INSURANCE | Admitting: Oncology

## 2011-11-20 LAB — OVA AND PARASITE SCREEN: OP: NONE SEEN

## 2011-11-21 ENCOUNTER — Inpatient Hospital Stay (HOSPITAL_COMMUNITY): Payer: PRIVATE HEALTH INSURANCE

## 2011-11-21 ENCOUNTER — Ambulatory Visit (HOSPITAL_COMMUNITY): Payer: PRIVATE HEALTH INSURANCE | Admitting: Oncology

## 2011-11-21 LAB — CLOSTRIDIUM DIFFICILE BY PCR: Toxigenic C. Difficile by PCR: NOT DETECTED

## 2011-11-22 LAB — STOOL CULTURE

## 2011-11-24 ENCOUNTER — Other Ambulatory Visit (INDEPENDENT_AMBULATORY_CARE_PROVIDER_SITE_OTHER): Payer: Self-pay | Admitting: *Deleted

## 2011-11-24 ENCOUNTER — Encounter (HOSPITAL_COMMUNITY): Payer: PRIVATE HEALTH INSURANCE

## 2011-11-24 ENCOUNTER — Encounter (INDEPENDENT_AMBULATORY_CARE_PROVIDER_SITE_OTHER): Payer: Self-pay | Admitting: *Deleted

## 2011-11-24 ENCOUNTER — Encounter (HOSPITAL_BASED_OUTPATIENT_CLINIC_OR_DEPARTMENT_OTHER): Payer: PRIVATE HEALTH INSURANCE | Admitting: Oncology

## 2011-11-24 VITALS — BP 118/82 | HR 79 | Temp 97.9°F | Ht 75.0 in | Wt 368.0 lb

## 2011-11-24 DIAGNOSIS — R197 Diarrhea, unspecified: Secondary | ICD-10-CM

## 2011-11-24 DIAGNOSIS — C9000 Multiple myeloma not having achieved remission: Secondary | ICD-10-CM

## 2011-11-24 DIAGNOSIS — R109 Unspecified abdominal pain: Secondary | ICD-10-CM

## 2011-11-24 NOTE — Progress Notes (Signed)
Problem number 1HA multiple myeloma stage II disease with a beta-2 microglobulin level less than 3.5 and albumin 3.4 I. chain disease only. His [name] is on hold now for 3 weeks as work as in the has been on hold since June 7 were pleased that was his last dose. We're going to continue to hold until next week.  Problem #2 ongoing diarrhea with 2 very loose bulky stools per day with workup ongoing by Dr. Laural Golden  Problem #3 morbid obesity weighing 368 pounds.  Problem #4 sleep apnea  Problem #5 diabetes mellitus  Problem 6 GERD  Problem #7 history of gout  [name] is here today still having the diarrhea. So it may well be from the bortezomib since it has not changed significantly off the [name]. Dr. Laural Golden plans colonoscopy Friday to make sure were not missing anything else is he does have white cells in his stool cultures so far negative. He has lots of upper abdominal pain with this diarrhea and his kappa light chains are responding beautifully. His kappa light chains are now less than 1 mg/dL so we will hold the therapy and to again a final answer. I may discuss his case with the physician at [facility] Dr. Sherral Hammers who saw him about trying this maintenance [name] versus considering bone marrow transplant. [name] is status post 6 cycles of the bortezomib.

## 2011-11-24 NOTE — Patient Instructions (Addendum)
SABIN MOLTON  [id number] Jul 29, 1960 Dr. Everardo All   [facility]  Discharge Instructions  RECOMMENDATIONS MADE BY THE CONSULTANT AND ANY TEST RESULTS WILL BE SENT TO YOUR REFERRING DOCTOR.   EXAM FINDINGS BY MD TODAY AND SIGNS AND SYMPTOMS TO REPORT TO CLINIC OR PRIMARY MD: discussion per MD.  Will hold chemotherapy until after colonoscopy.  MEDICATIONS PRESCRIBED: none   INSTRUCTIONS GIVEN AND DISCUSSED: Other :  Report uncontrolled pain, shortness of breath or other problems.  SPECIAL INSTRUCTIONS/FOLLOW-UP:  Call us on Monday so we can get your chemotherapy rescheduled. Return to Clinic in 3 weeks to see MD.   I acknowledge that I have been informed and understand all the instructions given to me and received a copy. I do not have any more questions at this time, but understand that I may call the Specialty Clinic at [facility] at [phone] during business hours should I have any further questions or need assistance in obtaining follow-up care.    __________________________________________  _____________  __________ Signature of Patient or Authorized Representative            Date                   Time    __________________________________________ Nurse's Signature

## 2011-11-25 ENCOUNTER — Encounter (HOSPITAL_COMMUNITY): Payer: Self-pay | Admitting: Pharmacy Technician

## 2011-11-25 ENCOUNTER — Other Ambulatory Visit (HOSPITAL_COMMUNITY): Payer: PRIVATE HEALTH INSURANCE

## 2011-11-25 ENCOUNTER — Inpatient Hospital Stay (HOSPITAL_COMMUNITY): Payer: PRIVATE HEALTH INSURANCE

## 2011-11-27 ENCOUNTER — Inpatient Hospital Stay (HOSPITAL_COMMUNITY): Payer: PRIVATE HEALTH INSURANCE

## 2011-11-28 ENCOUNTER — Encounter (HOSPITAL_COMMUNITY): Payer: Self-pay

## 2011-11-28 ENCOUNTER — Inpatient Hospital Stay (HOSPITAL_COMMUNITY): Payer: PRIVATE HEALTH INSURANCE

## 2011-11-28 ENCOUNTER — Encounter (HOSPITAL_COMMUNITY)
Admission: RE | Disposition: A | Discharge status: Home / Self Care | Payer: Self-pay | Source: Ambulatory Visit | Attending: Internal Medicine

## 2011-11-28 ENCOUNTER — Ambulatory Visit (HOSPITAL_COMMUNITY)
Admission: RE | Admit: 2011-11-28 | Discharge: 2011-11-28 | Disposition: A | Discharge status: Home / Self Care | Payer: PRIVATE HEALTH INSURANCE | Source: Ambulatory Visit | Attending: Internal Medicine | Admitting: Internal Medicine

## 2011-11-28 DIAGNOSIS — E785 Hyperlipidemia, unspecified: Secondary | ICD-10-CM | POA: Insufficient documentation

## 2011-11-28 DIAGNOSIS — Z79899 Other long term (current) drug therapy: Secondary | ICD-10-CM | POA: Insufficient documentation

## 2011-11-28 DIAGNOSIS — R197 Diarrhea, unspecified: Secondary | ICD-10-CM

## 2011-11-28 DIAGNOSIS — D128 Benign neoplasm of rectum: Secondary | ICD-10-CM | POA: Insufficient documentation

## 2011-11-28 DIAGNOSIS — Z9221 Personal history of antineoplastic chemotherapy: Secondary | ICD-10-CM | POA: Insufficient documentation

## 2011-11-28 DIAGNOSIS — D126 Benign neoplasm of colon, unspecified: Secondary | ICD-10-CM

## 2011-11-28 DIAGNOSIS — C9000 Multiple myeloma not having achieved remission: Secondary | ICD-10-CM | POA: Insufficient documentation

## 2011-11-28 DIAGNOSIS — E119 Type 2 diabetes mellitus without complications: Secondary | ICD-10-CM | POA: Insufficient documentation

## 2011-11-28 DIAGNOSIS — Z794 Long term (current) use of insulin: Secondary | ICD-10-CM | POA: Insufficient documentation

## 2011-11-28 DIAGNOSIS — D129 Benign neoplasm of anus and anal canal: Secondary | ICD-10-CM | POA: Insufficient documentation

## 2011-11-28 DIAGNOSIS — G4733 Obstructive sleep apnea (adult) (pediatric): Secondary | ICD-10-CM | POA: Insufficient documentation

## 2011-11-28 DIAGNOSIS — I10 Essential (primary) hypertension: Secondary | ICD-10-CM | POA: Insufficient documentation

## 2011-11-28 HISTORY — PX: COLONOSCOPY: SHX5424

## 2011-11-28 LAB — GLUCOSE, CAPILLARY: Glucose-Capillary: 106 mg/dL — ABNORMAL HIGH (ref 70–99)

## 2011-11-28 SURGERY — COLONOSCOPY
Anesthesia: Moderate Sedation

## 2011-11-28 MED ORDER — MEPERIDINE HCL 50 MG/ML IJ SOLN
INTRAMUSCULAR | Status: DC | PRN
  Administered 2011-11-28 (×2): 25 mg via INTRAVENOUS

## 2011-11-28 MED ORDER — MIDAZOLAM HCL 5 MG/5ML IJ SOLN
INTRAMUSCULAR | Status: AC
  Filled 2011-11-28: qty 10

## 2011-11-28 MED ORDER — MIDAZOLAM HCL 5 MG/5ML IJ SOLN
INTRAMUSCULAR | Status: DC | PRN
  Administered 2011-11-28: 3 mg via INTRAVENOUS
  Administered 2011-11-28 (×3): 2 mg via INTRAVENOUS

## 2011-11-28 MED ORDER — SODIUM CHLORIDE 0.45 % IV SOLN
Freq: Once | INTRAVENOUS | Status: AC
  Administered 2011-11-28: 09:00:00 via INTRAVENOUS

## 2011-11-28 MED ORDER — MEPERIDINE HCL 50 MG/ML IJ SOLN
INTRAMUSCULAR | Status: AC
  Filled 2011-11-28: qty 1

## 2011-11-28 NOTE — H&P (Signed)
[name] is an 51 y.o. male.   Chief Complaint: Patient is here for colonoscopy. HPI: Patient is 51 year old male who is undergoing therapy for multiple myeloma. He's been dealing with diarrhea for over 3 months. Stool studies been negative except positive fecal lactoferrin. Last dose of chemotherapy was over 3 weeks ago and he still with diarrhea. He has not responded to symptomatic therapy. He is therefore undergoing diagnostic colonoscopy. Family history is negative for CRC.  Past Medical History  Diagnosis Date  . Arteriosclerotic cardiovascular disease (ASCVD)     MI-2000s; stent to the proximal LAD and diagonal in 2001; stress nuclear in 2008-impaired exercise capacity, left ventricular dilatation, moderately to severely depressed EF, apical, inferior and anteroseptal scar  . Sleep apnea   . Hypertension   . 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 Multiple myeloma 07/01/2011  . Diabetes mellitus     Insulin  . GERD (gastroesophageal reflux disease)   . Pedal edema     Venous insufficiency  . Obesity   . Hyperlipidemia   . Chronic kidney disease, stage 3, mod decreased GFR     Creatinine of 1.84 in 06/2011 and 1.5 in 07/2011  . Injection Site Reaction     Past Surgical History  Procedure Date  . Laparoscopic gastric banding 2006  . Wrist surgery     Left; removal of bone fragment  . Incision and drainage abscess anal   . Abscess drainage     Scrotal  . Bone marrow biopsy 05/13/11  . Portacath placement 07/07/2011    Procedure: INSERTION PORT-A-CATH;  Surgeon: Scherry Ran, MD;  Location: [facility];  Service: General;  Laterality: N/A;    Family History  Problem Relation Age of Onset  . Heart disease Mother   . Cancer Mother   . Diabetes Father   . Anesthesia problems Neg Hx   . Hypotension Neg Hx   . Malignant hyperthermia Neg Hx   . Pseudochol deficiency Neg Hx    Social History:  reports that he quit smoking about 10 years ago. His smoking use included  Cigarettes and Cigars. He has never used smokeless tobacco. He reports that he does not drink alcohol or use illicit drugs.  Allergies: No Known Allergies  Medications Prior to Admission  Medication Sig Dispense Refill  . acyclovir (ZOVIRAX) 400 MG tablet Take 1 tablet (400 mg total) by mouth every morning. To start Jul 07, 2011 (to take daily while on chemo)  30 tablet  1  . allopurinol (ZYLOPRIM) 300 MG tablet Take 300 mg by mouth daily.      Marland Kitchen aspirin EC 325 MG tablet Take 325 mg by mouth every morning.       . bortezomib IV (VELCADE) 3.5 MG injection Inject 1.3 mg/m2 into the vein every 21 ( twenty-one) days. To start Jul 11, 2011      . carvedilol (COREG) 3.125 MG tablet Take 1 tablet (3.125 mg total) by mouth 2 (two) times daily.  60 tablet  6  . Cholecalciferol (VITAMIN D) 2000 UNITS CAPS Take 1 capsule by mouth every morning.       Marland Kitchen dexamethasone (DECADRON) 4 MG tablet Take 20 mg by mouth 2 (two) times daily. every Friday.      Marland Kitchen dexlansoprazole (DEXILANT) 60 MG capsule Take 60 mg by mouth every morning.       Marland Kitchen HYDROcodone-acetaminophen (VICODIN) 5-500 MG per tablet Take 1 tablet by mouth every 6 (six) hours as needed. For pain      .  insulin aspart (NOVOLOG) 100 UNIT/ML injection Inject into the skin as needed. Sliding scale      . insulin glargine (LANTUS) 100 UNIT/ML injection Inject 60 Units into the skin daily before breakfast.      . L-Methylfolate-B6-B12 (METANX PO) Take 1 tablet by mouth every morning.       Marland Kitchen lenalidomide ([name]) 25 MG capsule Take 25 mg by mouth See admin instructions. Take one tablet daily every day for 14 days of each month, then STOP for 7 days, then repeat.      . lidocaine-prilocaine (EMLA) cream Apply 1 application topically as needed. To port site. Apply 1 hour prior to treatment. Do not rub in. Cover with plastic.      . Liraglutide (VICTOZA) 18 MG/3ML SOLN Inject 1.2 mg into the skin every evening.       . loratadine (CLARITIN) 10 MG tablet Take 10  mg by mouth every morning.       Marland Kitchen LORazepam (ATIVAN) 1 MG tablet Take 1 mg by mouth every 4 (four) hours as needed. Nausea/vomiting      . Multiple Vitamins-Minerals (MULTIVITAMINS THER. W/MINERALS) TABS Take 1 tablet by mouth every morning.        . NON FORMULARY Herbal life shake daily      . ondansetron (ZOFRAN) 8 MG tablet Take 8 mg by mouth 2 (two) times daily as needed. Nausea/vomiting. Patient to also take this 30 minutes to 1 hour prior to Velcade injections.      . potassium chloride (K-DUR,KLOR-CON) 10 MEQ tablet Take 10 mEq by mouth every morning.       . prochlorperazine (COMPAZINE) 10 MG tablet Take 10 mg by mouth every 6 (six) hours as needed.      . rosuvastatin (CRESTOR) 10 MG tablet Take 10 mg by mouth every morning.       . sulfamethoxazole-trimethoprim (BACTRIM DS,SEPTRA DS) 800-160 MG per tablet Take 1 tablet by mouth 3 (three) times a week. To start Jul 07, 2011 (to take MWF while on chemo)      . Tadalafil (CIALIS PO) Take by mouth as needed.        . torsemide (DEMADEX) 20 MG tablet Take 40 mg by mouth daily.      . diphenoxylate-atropine (LOMOTIL) 2.5-0.025 MG per tablet Take 1 tablet by mouth 2 (two) times daily before a meal.       . Zoledronic Acid (ZOMETA IV) Inject 2 mg into the vein every 28 (twenty-eight) days. To start Jul 11, 2011        Results for orders placed during the hospital encounter of 11/28/11 (from the past 48 hour(s))  GLUCOSE, CAPILLARY     Status: Abnormal   Collection Time   11/28/11  8:52 AM      Component Value Range Comment   Glucose-Capillary 106 (*) 70 - 99 mg/dL    No results found.  ROS  Blood pressure 103/69, pulse 80, temperature 98.2 F (36.8 C), resp. rate 20, SpO2 97.00%. Physical Exam  Constitutional:       Well-developed obese male in NAD  HENT:  Mouth/Throat: Oropharynx is clear and moist.  Eyes: Conjunctivae are normal. No scleral icterus.  Neck: No thyromegaly present.  Cardiovascular: Normal rate, regular rhythm and  normal heart sounds.   No murmur heard. Respiratory: Effort normal and breath sounds normal.  GI: There is no tenderness.       Obese abdomen without tenderness organomegaly or masses  Musculoskeletal: Edema: 1+ pitting  edema to both legs.  Neurological: He is alert.  Skin: Skin is warm.     Assessment/Plan Diarrhea unresponsive to therapy in a patient is undergoing therapy for multiple myeloma. Stool studies positive for fecal lactoferrin. Diagnostic colonoscopy.  Benisha Hadaway U 11/28/2011, 9:19 AM

## 2011-11-28 NOTE — Discharge Instructions (Signed)
Resume usual medications and diet. No driving for 24 hours.   Physician will contact you with biopsy results.Colonoscopy Care After Read the instructions outlined below and refer to this sheet in the next few weeks. These discharge instructions provide you with general information on caring for yourself after you leave the hospital. Your doctor may also give you specific instructions. While your treatment has been planned according to the most current medical practices available, unavoidable complications occasionally occur. If you have any problems or questions after discharge, call your doctor. HOME CARE INSTRUCTIONS ACTIVITY:  You may resume your regular activity, but move at a slower pace for the next 24 hours.   Take frequent rest periods for the next 24 hours.   Walking will help get rid of the air and reduce the bloated feeling in your belly (abdomen).   No driving for 24 hours (because of the medicine (anesthesia) used during the test).   You may shower.   Do not sign any important legal documents or operate any machinery for 24 hours (because of the anesthesia used during the test).  NUTRITION:  Drink plenty of fluids.   You may resume your normal diet as instructed by your doctor.   Begin with a light meal and progress to your normal diet. Heavy or fried foods are harder to digest and may make you feel sick to your stomach (nauseated).   Avoid alcoholic beverages for 24 hours or as instructed.  MEDICATIONS:  You may resume your normal medications unless your doctor tells you otherwise.  WHAT TO EXPECT TODAY:  Some feelings of bloating in the abdomen.   Passage of more gas than usual.   Spotting of blood in your stool or on the toilet paper.  IF YOU HAD POLYPS REMOVED DURING THE COLONOSCOPY:  No aspirin products for 7 days or as instructed.   No alcohol for 7 days or as instructed.   Eat a soft diet for the next 24 hours.  FINDING OUT THE RESULTS OF YOUR  TEST Not all test results are available during your visit. If your test results are not back during the visit, make an appointment with your caregiver to find out the results. Do not assume everything is normal if you have not heard from your caregiver or the medical facility. It is important for you to follow up on all of your test results.  SEEK IMMEDIATE MEDICAL CARE IF:  You have more than a spotting of blood in your stool.   Your belly is swollen (abdominal distention).   You are nauseated or vomiting.   You have a fever.   You have abdominal pain or discomfort that is severe or gets worse throughout the day.  Document Released: 01/01/2004 Document Revised: 05/08/2011 Document Reviewed: 12/30/2007 [facility] Patient Information 2012 [place].

## 2011-11-28 NOTE — Op Note (Signed)
COLONOSCOPY PROCEDURE REPORT  PATIENT:  [name]  MR#:  [id number] Birthdate:  January 05, 1961, 51 y.o., male Endoscopist:  Dr. Rogene Houston, MD Referred By:  Dr. Pieter Partridge,  MD Procedure Date: 11/28/2011  Procedure:   Colonoscopy  Indications:  Patient is 51 year old African American male with over 3 months history of diarrhea. He is undergoing therapy for multiple myeloma and has responded to his last dose of chemotherapy was almost 4 weeks ago but his diarrhea remains. Stool studies significant for positive for fecal lactoferrin.  Informed Consent:  The procedure and risks were reviewed with the patient and informed consent was obtained.  Medications:  Demerol 50 mg IV Versed 9 mg IV  Description of procedure:  After a digital rectal exam was performed, that colonoscope was advanced from the anus through the rectum and colon to the area of the cecum, ileocecal valve and appendiceal orifice. The cecum was deeply intubated. These structures were well-seen and photographed for the record. From the level of the cecum and ileocecal valve, the scope was slowly and cautiously withdrawn. The mucosal surfaces were carefully surveyed utilizing scope tip to flexion to facilitate fold flattening as needed. The scope was pulled down into the rectum where a thorough exam including retroflexion was performed. Terminal ileum was also examined.  Findings:   Prep excellent. Normal mucosa of terminal ileum. Biopsy taken for routine histology. Normal mucosa of colon and rectum. Random biopsy taken from mucosa of sigmoid colon. Small polyp ablated via cold biopsy from rectosigmoid junction. Normal anal rectal junction.  Therapeutic/Diagnostic Maneuvers Performed:  See above  Complications:  None  Cecal Withdrawal Time:  15 minutes  Impression:  Normal terminal ileum. No endoscopic evidence of colitis. Random biopsies taken from terminal ileum and sigmoid colon. Small polyp ablated via  cold biopsy from rectosigmoid junction. Diarrhea may be secondary to diabetes. If biopsies are negative will consider workup for small bowel diarrhea.   Recommendations:  Patient will continue Lomotil 2 tablets by mouth twice a day. I will contact patient with results of biopsy and further recommendations.  Lelia Jons U  11/28/2011 10:01 AM  CC: Dr. [name], MD & Dr. Rayne Du ref. provider found   Dr. Pieter Partridge, MD

## 2011-12-02 ENCOUNTER — Telehealth (INDEPENDENT_AMBULATORY_CARE_PROVIDER_SITE_OTHER): Payer: Self-pay | Admitting: *Deleted

## 2011-12-02 ENCOUNTER — Encounter (HOSPITAL_COMMUNITY): Payer: Self-pay | Admitting: Internal Medicine

## 2011-12-02 NOTE — Telephone Encounter (Signed)
Patient said Dr. Laural Golden was going to call him today with his results. Please return the call to [phone] or [phone].

## 2011-12-02 NOTE — Telephone Encounter (Signed)
I have already called him with the results

## 2011-12-03 ENCOUNTER — Telehealth (INDEPENDENT_AMBULATORY_CARE_PROVIDER_SITE_OTHER): Payer: Self-pay | Admitting: *Deleted

## 2011-12-03 ENCOUNTER — Other Ambulatory Visit (INDEPENDENT_AMBULATORY_CARE_PROVIDER_SITE_OTHER): Payer: Self-pay | Admitting: Internal Medicine

## 2011-12-03 DIAGNOSIS — R197 Diarrhea, unspecified: Secondary | ICD-10-CM

## 2011-12-03 NOTE — Telephone Encounter (Signed)
Called patient and gave appt for SBFT for Friday at [facility] (they are aware of his weight).  He asked since diarrhea is not getting any better was there anything Dr. Laural Golden could give him for the diarrhea.  I told him he may want to wait until after this test is done first but I didn't know and we could ask.  [place] ([place])  NKDA per patient

## 2011-12-03 NOTE — Telephone Encounter (Signed)
Sent to Dr Sherren Mocha Zavior Thomason in error.  Ms Pacifico is not a patient of his.

## 2011-12-05 ENCOUNTER — Other Ambulatory Visit (INDEPENDENT_AMBULATORY_CARE_PROVIDER_SITE_OTHER): Payer: Self-pay | Admitting: Internal Medicine

## 2011-12-05 ENCOUNTER — Ambulatory Visit (HOSPITAL_COMMUNITY)
Admission: RE | Admit: 2011-12-05 | Discharge: 2011-12-05 | Disposition: A | Discharge status: Home / Self Care | Payer: PRIVATE HEALTH INSURANCE | Source: Ambulatory Visit | Attending: Internal Medicine | Admitting: Internal Medicine

## 2011-12-05 DIAGNOSIS — R197 Diarrhea, unspecified: Secondary | ICD-10-CM | POA: Insufficient documentation

## 2011-12-05 DIAGNOSIS — E119 Type 2 diabetes mellitus without complications: Secondary | ICD-10-CM | POA: Insufficient documentation

## 2011-12-05 DIAGNOSIS — I10 Essential (primary) hypertension: Secondary | ICD-10-CM | POA: Insufficient documentation

## 2011-12-05 DIAGNOSIS — R933 Abnormal findings on diagnostic imaging of other parts of digestive tract: Secondary | ICD-10-CM | POA: Insufficient documentation

## 2011-12-05 MED ORDER — CHOLESTYRAMINE 4 G PO PACK: 1.0000 | PACK | Freq: Two times a day (BID) | ORAL | Status: DC

## 2011-12-08 ENCOUNTER — Encounter (HOSPITAL_COMMUNITY): Payer: PRIVATE HEALTH INSURANCE | Attending: Oncology

## 2011-12-08 ENCOUNTER — Encounter: Payer: Self-pay | Admitting: Oncology

## 2011-12-08 VITALS — BP 118/83 | HR 80 | Temp 97.6°F

## 2011-12-08 DIAGNOSIS — C9000 Multiple myeloma not having achieved remission: Secondary | ICD-10-CM

## 2011-12-08 MED ORDER — HEPARIN SOD (PORK) LOCK FLUSH 100 UNIT/ML IV SOLN
500.0000 [IU] | Freq: Once | INTRAVENOUS | Status: AC
  Administered 2011-12-08: 500 [IU] via INTRAVENOUS
  Filled 2011-12-08: qty 5

## 2011-12-08 MED ORDER — SODIUM CHLORIDE 0.9 % IV SOLN
INTRAVENOUS | Status: DC
  Administered 2011-12-08: 16:00:00 via INTRAVENOUS

## 2011-12-08 MED ORDER — SODIUM CHLORIDE 0.9 % IJ SOLN
10.0000 mL | INTRAMUSCULAR | Status: DC | PRN
  Administered 2011-12-08: 10 mL via INTRAVENOUS
  Filled 2011-12-08: qty 10

## 2011-12-08 MED ORDER — ZOLEDRONIC ACID 4 MG/5ML IV CONC
2.0000 mg | Freq: Once | INTRAVENOUS | Status: AC
  Administered 2011-12-08: 2 mg via INTRAVENOUS
  Filled 2011-12-08: qty 2.5

## 2011-12-08 NOTE — Progress Notes (Signed)
Tolerated zometa infusion well.

## 2011-12-09 ENCOUNTER — Telehealth (HOSPITAL_COMMUNITY): Payer: Self-pay | Admitting: Oncology

## 2011-12-11 ENCOUNTER — Encounter (INDEPENDENT_AMBULATORY_CARE_PROVIDER_SITE_OTHER): Payer: Self-pay | Admitting: *Deleted

## 2011-12-12 ENCOUNTER — Encounter (HOSPITAL_BASED_OUTPATIENT_CLINIC_OR_DEPARTMENT_OTHER): Payer: PRIVATE HEALTH INSURANCE | Admitting: Oncology

## 2011-12-12 ENCOUNTER — Encounter (HOSPITAL_COMMUNITY): Payer: Self-pay | Admitting: Oncology

## 2011-12-12 VITALS — BP 97/67 | HR 84 | Temp 97.4°F | Wt 355.8 lb

## 2011-12-12 DIAGNOSIS — C9000 Multiple myeloma not having achieved remission: Secondary | ICD-10-CM

## 2011-12-12 DIAGNOSIS — R197 Diarrhea, unspecified: Secondary | ICD-10-CM

## 2011-12-12 NOTE — Patient Instructions (Signed)
[facility]  Discharge Instructions VERLAN HENSCHEL  [id number] [birth year] Dr. Everardo All  RECOMMENDATIONS MADE BY THE CONSULTANT AND ANY TEST RESULTS WILL BE SENT TO YOUR REFERRING DOCTOR.   EXAM FINDINGS BY MD TODAY AND SIGNS AND SYMPTOMS TO REPORT TO CLINIC OR PRIMARY MD:  We will hold off treatment until Rehman's completed  SPECIAL INSTRUCTIONS/FOLLOW-UP: Lab work Needed next Tuesday appt with [name] next month  I acknowledge that I have been informed and understand all the instructions given to me and received a copy. I do not have any more questions at this time, but understand that I may call the Specialty Clinic at [facility] at [phone] during business hours should I have any further questions or need assistance in obtaining follow-up care.    __________________________________________  _____________  __________ Signature of Patient or Authorized Representative            Date                   Time    __________________________________________ Nurse's Signature

## 2011-12-12 NOTE — Progress Notes (Signed)
[name] has myeloma with a nice remission presently. Unfortunately he still having two voluminous stools per day but he did start cholestyramine this past Saturday one packet twice a day  Whenever he drinks fluids etc. he gets tremendous borborygmi. I have communicated with Dr. Laural Golden who is still evaluating his GI issues and does not want me to reinitiate therapy until this is resolved. I wholeheartedly agree. We will check his labs next Tuesday and see him in a month sooner if we get the answer or the go ahead to start therapy which will be maintenance at this juncture

## 2011-12-15 ENCOUNTER — Other Ambulatory Visit (INDEPENDENT_AMBULATORY_CARE_PROVIDER_SITE_OTHER): Payer: Self-pay | Admitting: Internal Medicine

## 2011-12-15 ENCOUNTER — Telehealth (INDEPENDENT_AMBULATORY_CARE_PROVIDER_SITE_OTHER): Payer: Self-pay | Admitting: *Deleted

## 2011-12-15 ENCOUNTER — Ambulatory Visit (HOSPITAL_COMMUNITY): Payer: PRIVATE HEALTH INSURANCE | Admitting: Oncology

## 2011-12-15 DIAGNOSIS — R197 Diarrhea, unspecified: Secondary | ICD-10-CM

## 2011-12-15 NOTE — Telephone Encounter (Signed)
Patient's called returned. He is still having loose watery stools at least 3 times a day. Will proceed with 24-hour stool collection for quantitative fecal fat as well as electrolytes and osmolality. Patient advised to stay on his usual diet for him during collection. He should stop antidiarrheal day before collection and during stool collection.

## 2011-12-15 NOTE — Telephone Encounter (Signed)
Calling back to follow up on his bowels. Dr. Laural Golden was going to give him a return call this past weekend. Please return the call to [phone] or [phone].

## 2011-12-16 ENCOUNTER — Encounter (HOSPITAL_BASED_OUTPATIENT_CLINIC_OR_DEPARTMENT_OTHER): Payer: PRIVATE HEALTH INSURANCE

## 2011-12-16 DIAGNOSIS — C9000 Multiple myeloma not having achieved remission: Secondary | ICD-10-CM

## 2011-12-16 LAB — DIFFERENTIAL
Basophils Absolute: 0 10*3/uL (ref 0.0–0.1)
Basophils Relative: 0 % (ref 0–1)
Eosinophils Absolute: 0.1 10*3/uL (ref 0.0–0.7)
Eosinophils Relative: 1 % (ref 0–5)
Lymphocytes Relative: 24 % (ref 12–46)
Lymphs Abs: 2.3 10*3/uL (ref 0.7–4.0)
Monocytes Absolute: 1.2 10*3/uL — ABNORMAL HIGH (ref 0.1–1.0)
Monocytes Relative: 12 % (ref 3–12)
Neutro Abs: 6 10*3/uL (ref 1.7–7.7)
Neutrophils Relative %: 63 % (ref 43–77)

## 2011-12-16 LAB — COMPREHENSIVE METABOLIC PANEL
ALT: 24 U/L (ref 0–53)
AST: 20 U/L (ref 0–37)
Albumin: 3.3 g/dL — ABNORMAL LOW (ref 3.5–5.2)
Alkaline Phosphatase: 88 U/L (ref 39–117)
BUN: 34 mg/dL — ABNORMAL HIGH (ref 6–23)
CO2: 17 mEq/L — ABNORMAL LOW (ref 19–32)
Calcium: 9.4 mg/dL (ref 8.4–10.5)
Chloride: 103 mEq/L (ref 96–112)
Creatinine, Ser: 1.7 mg/dL — ABNORMAL HIGH (ref 0.50–1.35)
GFR calc Af Amer: 52 mL/min — ABNORMAL LOW (ref >90)
GFR calc non Af Amer: 45 mL/min — ABNORMAL LOW (ref >90)
Glucose, Bld: 108 mg/dL — ABNORMAL HIGH (ref 70–99)
Potassium: 2.8 mEq/L — ABNORMAL LOW (ref 3.5–5.1)
Sodium: 132 mEq/L — ABNORMAL LOW (ref 135–145)
Total Bilirubin: 0.4 mg/dL (ref 0.3–1.2)
Total Protein: 6.7 g/dL (ref 6.0–8.3)

## 2011-12-16 LAB — CBC
HCT: 36 % — ABNORMAL LOW (ref 39.0–52.0)
Hemoglobin: 12.3 g/dL — ABNORMAL LOW (ref 13.0–17.0)
MCH: 31.1 pg (ref 26.0–34.0)
MCHC: 34.2 g/dL (ref 30.0–36.0)
MCV: 91.1 fL (ref 78.0–100.0)
Platelets: 187 10*3/uL (ref 150–400)
RBC: 3.95 MIL/uL — ABNORMAL LOW (ref 4.22–5.81)
RDW: 16 % — ABNORMAL HIGH (ref 11.5–15.5)
WBC: 9.6 10*3/uL (ref 4.0–10.5)

## 2011-12-16 NOTE — Progress Notes (Signed)
Labs drawn today for cmp,mm panel,kllc, cbc/diff

## 2011-12-16 NOTE — Telephone Encounter (Signed)
I talked with patient twice yesterday. He is still with diarrhea. Will proceed with 24-hour stool collection for further studies.

## 2011-12-17 LAB — KAPPA/LAMBDA LIGHT CHAINS
Kappa free light chain: 1.11 mg/dL (ref 0.33–1.94)
Kappa, lambda light chain ratio: 1.42 (ref 0.26–1.65)
Lambda free light chains: 0.78 mg/dL (ref 0.57–2.63)

## 2011-12-19 ENCOUNTER — Other Ambulatory Visit (INDEPENDENT_AMBULATORY_CARE_PROVIDER_SITE_OTHER): Payer: Self-pay | Admitting: Internal Medicine

## 2011-12-19 LAB — MULTIPLE MYELOMA PANEL, SERUM
Albumin ELP: 54.7 % — ABNORMAL LOW (ref 55.8–66.1)
Alpha-1-Globulin: 8.9 % — ABNORMAL HIGH (ref 2.9–4.9)
Alpha-2-Globulin: 17.1 % — ABNORMAL HIGH (ref 7.1–11.8)
Beta 2: 5.7 % (ref 3.2–6.5)
Beta Globulin: 6 % (ref 4.7–7.2)
Gamma Globulin: 7.6 % — ABNORMAL LOW (ref 11.1–18.8)
IgA: 68 mg/dL (ref 68–379)
IgG (Immunoglobin G), Serum: 477 mg/dL — ABNORMAL LOW (ref 650–1600)
IgM, Serum: 46 mg/dL — ABNORMAL LOW (ref 41–251)
M-Spike, %: NOT DETECTED g/dL
Total Protein: 6.2 g/dL (ref 6.0–8.3)

## 2011-12-22 ENCOUNTER — Other Ambulatory Visit (HOSPITAL_COMMUNITY): Payer: Self-pay | Admitting: Oncology

## 2011-12-22 DIAGNOSIS — R197 Diarrhea, unspecified: Secondary | ICD-10-CM

## 2011-12-22 MED ORDER — DIPHENOXYLATE-ATROPINE 2.5-0.025 MG PO TABS: ORAL_TABLET | ORAL | Status: DC

## 2011-12-24 NOTE — Telephone Encounter (Signed)
I faxed the order to [facility]

## 2011-12-29 LAB — FECAL FAT, QUANTITATIVE: Fecal Lipids, 24 hr: 91 g/(24.h) — ABNORMAL HIGH (ref <7)

## 2011-12-30 ENCOUNTER — Other Ambulatory Visit (INDEPENDENT_AMBULATORY_CARE_PROVIDER_SITE_OTHER): Payer: Self-pay | Admitting: *Deleted

## 2011-12-30 DIAGNOSIS — K909 Intestinal malabsorption, unspecified: Secondary | ICD-10-CM

## 2011-12-31 ENCOUNTER — Encounter (HOSPITAL_COMMUNITY): Payer: Self-pay | Admitting: Pharmacy Technician

## 2012-01-02 ENCOUNTER — Encounter (HOSPITAL_COMMUNITY)
Admission: RE | Disposition: A | Discharge status: Home / Self Care | Payer: Self-pay | Source: Ambulatory Visit | Attending: Internal Medicine

## 2012-01-02 ENCOUNTER — Ambulatory Visit (HOSPITAL_COMMUNITY)
Admission: RE | Admit: 2012-01-02 | Discharge: 2012-01-02 | Disposition: A | Discharge status: Home / Self Care | Payer: PRIVATE HEALTH INSURANCE | Source: Ambulatory Visit | Attending: Internal Medicine | Admitting: Internal Medicine

## 2012-01-02 ENCOUNTER — Encounter (HOSPITAL_COMMUNITY): Payer: Self-pay | Admitting: *Deleted

## 2012-01-02 DIAGNOSIS — R197 Diarrhea, unspecified: Secondary | ICD-10-CM | POA: Insufficient documentation

## 2012-01-02 DIAGNOSIS — Z01812 Encounter for preprocedural laboratory examination: Secondary | ICD-10-CM | POA: Insufficient documentation

## 2012-01-02 DIAGNOSIS — Z9884 Bariatric surgery status: Secondary | ICD-10-CM | POA: Insufficient documentation

## 2012-01-02 DIAGNOSIS — K909 Intestinal malabsorption, unspecified: Secondary | ICD-10-CM

## 2012-01-02 DIAGNOSIS — I10 Essential (primary) hypertension: Secondary | ICD-10-CM | POA: Insufficient documentation

## 2012-01-02 DIAGNOSIS — E119 Type 2 diabetes mellitus without complications: Secondary | ICD-10-CM | POA: Insufficient documentation

## 2012-01-02 DIAGNOSIS — E785 Hyperlipidemia, unspecified: Secondary | ICD-10-CM | POA: Insufficient documentation

## 2012-01-02 DIAGNOSIS — K319 Disease of stomach and duodenum, unspecified: Secondary | ICD-10-CM

## 2012-01-02 DIAGNOSIS — K449 Diaphragmatic hernia without obstruction or gangrene: Secondary | ICD-10-CM | POA: Insufficient documentation

## 2012-01-02 DIAGNOSIS — D131 Benign neoplasm of stomach: Secondary | ICD-10-CM

## 2012-01-02 HISTORY — PX: BIOPSY: SHX5522

## 2012-01-02 HISTORY — PX: ESOPHAGOGASTRODUODENOSCOPY: SHX5428

## 2012-01-02 LAB — GLUCOSE, CAPILLARY: Glucose-Capillary: 105 mg/dL — ABNORMAL HIGH (ref 70–99)

## 2012-01-02 SURGERY — EGD (ESOPHAGOGASTRODUODENOSCOPY)
Anesthesia: Moderate Sedation

## 2012-01-02 MED ORDER — MIDAZOLAM HCL 5 MG/5ML IJ SOLN
INTRAMUSCULAR | Status: AC
  Filled 2012-01-02: qty 10

## 2012-01-02 MED ORDER — HEPARIN SOD (PORK) LOCK FLUSH 100 UNIT/ML IV SOLN
INTRAVENOUS | Status: AC
  Filled 2012-01-02: qty 5

## 2012-01-02 MED ORDER — MEPERIDINE HCL 100 MG/ML IJ SOLN
INTRAMUSCULAR | Status: AC
  Filled 2012-01-02: qty 1

## 2012-01-02 MED ORDER — MIDAZOLAM HCL 5 MG/5ML IJ SOLN
INTRAMUSCULAR | Status: DC | PRN
  Administered 2012-01-02: 2 mg via INTRAVENOUS
  Administered 2012-01-02: 3 mg via INTRAVENOUS
  Administered 2012-01-02 (×2): 2 mg via INTRAVENOUS

## 2012-01-02 MED ORDER — SODIUM CHLORIDE 0.9 % IJ SOLN
INTRAMUSCULAR | Status: AC
  Filled 2012-01-02: qty 40

## 2012-01-02 MED ORDER — STERILE WATER FOR IRRIGATION IR SOLN
Status: DC | PRN
  Administered 2012-01-02: 13:00:00

## 2012-01-02 MED ORDER — MEPERIDINE HCL 25 MG/ML IJ SOLN
INTRAMUSCULAR | Status: DC | PRN
Start: 2012-01-02 — End: 2012-01-02
  Administered 2012-01-02 (×2): 25 mg via INTRAVENOUS

## 2012-01-02 MED ORDER — BUTAMBEN-TETRACAINE-BENZOCAINE 2-2-14 % EX AERO
INHALATION_SPRAY | CUTANEOUS | Status: DC | PRN
  Administered 2012-01-02: 2 via TOPICAL

## 2012-01-02 MED ORDER — SODIUM CHLORIDE 0.45 % IV SOLN
Freq: Once | INTRAVENOUS | Status: AC
  Administered 2012-01-02: 1000 mL via INTRAVENOUS

## 2012-01-02 MED ORDER — SODIUM CHLORIDE 0.9 % IJ SOLN
INTRAMUSCULAR | Status: AC
  Filled 2012-01-02: qty 10

## 2012-01-02 NOTE — H&P (Signed)
[name] is an 51 y.o. male.   Chief Complaint: Patient is here for EGD and duodenal biopsy. HPI: Patient is 51 year old American male with several week history of diarrhea associated with loss. Symptoms started after you see chemotherapy for multiple myeloma and is in remission. His diarrhea has continued [name] he finished chemotherapy several weeks ago. He underwent colonoscopy last month with biopsy from sigmoid colon and these were normal. He had a 24-hour fecal fat analysis and he has steatorrhea. He is therefore undergoing EGD with duodenal biopsy to rule out mucosal disease.  Past Medical History  Diagnosis Date  . Arteriosclerotic cardiovascular disease (ASCVD)     MI-2000s; stent to the proximal LAD and diagonal in 2001; stress nuclear in 2008-impaired exercise capacity, left ventricular dilatation, moderately to severely depressed EF, apical, inferior and anteroseptal scar  . Sleep apnea   . Hypertension   . 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 Multiple myeloma 07/01/2011  . Diabetes mellitus     Insulin  . GERD (gastroesophageal reflux disease)   . Pedal edema     Venous insufficiency  . Obesity   . Hyperlipidemia   . Chronic kidney disease, stage 3, mod decreased GFR     Creatinine of 1.84 in 06/2011 and 1.5 in 07/2011  . Injection Site Reaction     Past Surgical History  Procedure Date  . Laparoscopic gastric banding 2006  . Wrist surgery     Left; removal of bone fragment  . Incision and drainage abscess anal   . Abscess drainage     Scrotal  . Bone marrow biopsy 05/13/11  . Portacath placement 07/07/2011    Procedure: INSERTION PORT-A-CATH;  Surgeon: Scherry Ran, MD;  Location: [facility];  Service: General;  Laterality: N/A;  . Colonoscopy 11/28/2011    Procedure: COLONOSCOPY;  Surgeon: Rogene Houston, MD;  Location: [facility];  Service: Endoscopy;  Laterality: N/A;  930    Family History  Problem Relation Age of Onset  . Heart disease Mother   .  Cancer Mother   . Diabetes Father   . Anesthesia problems Neg Hx   . Hypotension Neg Hx   . Malignant hyperthermia Neg Hx   . Pseudochol deficiency Neg Hx    Social History:  reports that he quit smoking about 10 years ago. His smoking use included Cigarettes and Cigars. He has never used smokeless tobacco. He reports that he does not drink alcohol or use illicit drugs.  Allergies: No Known Allergies  Medications Prior to Admission  Medication Sig Dispense Refill  . acyclovir (ZOVIRAX) 400 MG tablet Take 1 tablet (400 mg total) by mouth every morning. To start Jul 07, 2011 (to take daily while on chemo)  30 tablet  1  . allopurinol (ZYLOPRIM) 300 MG tablet Take 300 mg by mouth daily.      Marland Kitchen aspirin EC 325 MG tablet Take 325 mg by mouth every morning.       . bortezomib IV (VELCADE) 3.5 MG injection Inject 1.3 mg/m2 into the vein every 21 ( twenty-one) days. To start Jul 11, 2011      . carvedilol (COREG) 3.125 MG tablet Take 1 tablet (3.125 mg total) by mouth 2 (two) times daily.  60 tablet  6  . Cholecalciferol (VITAMIN D) 2000 UNITS CAPS Take 1 capsule by mouth every morning.       . cholestyramine (QUESTRAN) 4 G packet Take 1 packet by mouth 2 (two) times daily.  60 each  12  . dexamethasone (DECADRON) 4 MG tablet Take 20 mg by mouth 2 (two) times daily. every Friday.      Marland Kitchen dexlansoprazole (DEXILANT) 60 MG capsule Take 60 mg by mouth every morning.       . diphenoxylate-atropine (LOMOTIL) 2.5-0.025 MG per tablet Take two tablets PO after each loose stool.  Max 8 per day  50 tablet  1  . HYDROcodone-acetaminophen (VICODIN) 5-500 MG per tablet Take 1 tablet by mouth every 6 (six) hours as needed. For pain      . insulin aspart (NOVOLOG) 100 UNIT/ML injection Inject 50 Units into the skin as needed. Sliding scale      . insulin glargine (LANTUS) 100 UNIT/ML injection Inject 60 Units into the skin daily before breakfast.      . L-Methylfolate-B6-B12 (METANX PO) Take 1 tablet by mouth every  morning.       Marland Kitchen lenalidomide ([name]) 25 MG capsule Take 25 mg by mouth See admin instructions. Take one tablet daily every day for 14 days of each month, then STOP for 7 days, then repeat.      . lidocaine-prilocaine (EMLA) cream Apply 1 application topically as needed. To port site. Apply 1 hour prior to treatment. Do not rub in. Cover with plastic.      . Liraglutide (VICTOZA) 18 MG/3ML SOLN Inject 1.2 mg into the skin every evening.       . loratadine (CLARITIN) 10 MG tablet Take 10 mg by mouth every morning.       Marland Kitchen LORazepam (ATIVAN) 1 MG tablet Take 1 mg by mouth every 4 (four) hours as needed. Nausea/vomiting      . Multiple Vitamins-Minerals (MULTIVITAMINS THER. W/MINERALS) TABS Take 1 tablet by mouth every morning.        . NON FORMULARY Herbal life shake daily      . ondansetron (ZOFRAN) 8 MG tablet Take 8 mg by mouth 2 (two) times daily as needed. Nausea/vomiting. Patient to also take this 30 minutes to 1 hour prior to Velcade injections.      . potassium chloride (K-DUR,KLOR-CON) 10 MEQ tablet Take 10 mEq by mouth every morning.       . prochlorperazine (COMPAZINE) 10 MG tablet Take 10 mg by mouth every 6 (six) hours as needed.      . rosuvastatin (CRESTOR) 10 MG tablet Take 10 mg by mouth every morning.       . sulfamethoxazole-trimethoprim (BACTRIM DS,SEPTRA DS) 800-160 MG per tablet Take 1 tablet by mouth 3 (three) times a week. To start Jul 07, 2011 (to take MWF while on chemo)      . Tadalafil (CIALIS PO) Take by mouth as needed.        . torsemide (DEMADEX) 20 MG tablet Take 40 mg by mouth daily.      . Zoledronic Acid (ZOMETA IV) Inject 2 mg into the vein every 28 (twenty-eight) days. To start Jul 11, 2011        Results for orders placed during the hospital encounter of 01/02/12 (from the past 48 hour(s))  GLUCOSE, CAPILLARY     Status: Abnormal   Collection Time   01/02/12 12:28 PM      Component Value Range Comment   Glucose-Capillary 105 (*) 70 - 99 mg/dL    No results  found.  ROS  Blood pressure 104/66, pulse 61, temperature 98 F (36.7 C), temperature source Oral, resp. rate 22, SpO2 98.00%. Physical Exam  Constitutional: He appears well-developed.  Obese male in NAD  HENT:  Mouth/Throat: Oropharynx is clear and moist.  Eyes: Conjunctivae are normal. No scleral icterus.  Neck: No thyromegaly present.  Cardiovascular: Normal rate, regular rhythm and normal heart sounds.   Respiratory: Effort normal and breath sounds normal.  GI: Soft. He exhibits no mass. There is no tenderness.  Musculoskeletal: He exhibits no edema.  Lymphadenopathy:    He has no cervical adenopathy.  Neurological: He is alert.  Skin: Skin is warm and dry.     Assessment/Plan Diarrhea secondary to malabsorption or steatorrhea. EGD with duodenal biopsy.  Merriel Zinger U 01/02/2012, 12:53 PM

## 2012-01-02 NOTE — Op Note (Signed)
EGD PROCEDURE REPORT  PATIENT:  [name]  MR#:  [id number] Birthdate:  06/19/1960, 51 y.o., male Endoscopist:  Dr. Rogene Houston, MD Referred By:  Dr. [name]. [name], MD Procedure Date: 01/02/2012  Procedure:   EGD with duodenal biopsy.  Indications:  Patient is 51 year old male with multiple medical problems is treated earlier this year for multiple myeloma is developed diarrhea. Colonoscopy was negative. Fecal fat analysis revealed  malabsorption or steatorrhea. He is therefore undergoing EGD with duodenal biopsy.            Informed Consent:  The risks, benefits, alternatives & imponderables which include, but are not limited to, bleeding, infection, perforation, drug reaction and potential missed lesion have been reviewed.  The potential for biopsy, lesion removal, esophageal dilation, etc. have also been discussed.  Questions have been answered.  All parties agreeable.  Please see history & physical in medical record for more information.  Medications:  Demerol 50 mg IV Versed 9 mg IV Cetacaine spray topically for oropharyngeal anesthesia  Description of procedure:  The endoscope was introduced through the mouth and advanced to the second portion of the duodenum without difficulty or limitations. The mucosal surfaces were surveyed very carefully during advancement of the scope and upon withdrawal.  Findings:  Esophagus:  Mucosa of the esophagus was normal. Small sliding hiatal hernia noted. GEJ:  42 cm Hiatus:  44 cm Stomach:  There was large amount of food debris in the stomach. Single band of gastric tissue noted just below cardia dividing proximal stomach into 2 compartments. Silk suture noted in this area secondary to prior gastric banding. Mucosa at antrum was normal. Examination of mucosa at body was limited because of food debris. Pyloric channel was patent. Few small hyperplastic-appearing polyps noted at gastric fundus. Duodenum:  Normal bulbar and post bulbar  mucosa. Goes of biopsy taken from second part of duodenum routine histology.  Therapeutic/Diagnostic Maneuvers Performed:  See above  Complications:  None.  Impression: Small sliding hiatal hernia. Post surgical changes in the proximal stomach secondary to previous gastric banding as described above. Large amount of food debris in the stomach with a patent pylorus suggesting gastroparesis. Within the biopsy taken from normal-appearing post bulbar duodenal mucosa.   Recommendations:  Step 3 gastroparesis diet. I will contact patient with results of biopsy and further recommendations  Jettie Lazare U  01/02/2012  1:29 PM  CC: Dr. [name], MD & Dr. Rayne Du ref. provider found

## 2012-01-06 ENCOUNTER — Other Ambulatory Visit (HOSPITAL_COMMUNITY): Payer: Self-pay | Admitting: Oncology

## 2012-01-06 DIAGNOSIS — C9000 Multiple myeloma not having achieved remission: Secondary | ICD-10-CM

## 2012-01-06 MED ORDER — ACYCLOVIR 400 MG PO TABS: 400.0000 mg | ORAL_TABLET | ORAL | Status: DC

## 2012-01-07 ENCOUNTER — Encounter (HOSPITAL_COMMUNITY): Payer: Self-pay | Admitting: Internal Medicine

## 2012-01-08 ENCOUNTER — Telehealth (HOSPITAL_COMMUNITY): Payer: Self-pay | Admitting: *Deleted

## 2012-01-08 ENCOUNTER — Other Ambulatory Visit (HOSPITAL_COMMUNITY): Payer: Self-pay | Admitting: Oncology

## 2012-01-08 DIAGNOSIS — E876 Hypokalemia: Secondary | ICD-10-CM

## 2012-01-08 MED ORDER — POTASSIUM CHLORIDE CRYS ER 10 MEQ PO TBCR: 10.0000 meq | EXTENDED_RELEASE_TABLET | Freq: Two times a day (BID) | ORAL | Status: DC

## 2012-01-08 NOTE — Telephone Encounter (Signed)
Pt states he is on potassium 2 twice a day

## 2012-01-12 ENCOUNTER — Encounter (HOSPITAL_COMMUNITY): Payer: PRIVATE HEALTH INSURANCE | Attending: Oncology | Admitting: Oncology

## 2012-01-12 VITALS — BP 118/77 | HR 84 | Temp 98.1°F | Resp 20 | Wt 344.0 lb

## 2012-01-12 DIAGNOSIS — K9089 Other intestinal malabsorption: Secondary | ICD-10-CM

## 2012-01-12 DIAGNOSIS — C9000 Multiple myeloma not having achieved remission: Secondary | ICD-10-CM | POA: Insufficient documentation

## 2012-01-12 DIAGNOSIS — R197 Diarrhea, unspecified: Secondary | ICD-10-CM

## 2012-01-12 DIAGNOSIS — C9001 Multiple myeloma in remission: Secondary | ICD-10-CM

## 2012-01-12 NOTE — Patient Instructions (Signed)
[name]  [id number] January 17, 1961 Dr. Everardo All  [facility]  Discharge Instructions  RECOMMENDATIONS MADE BY THE CONSULTANT AND ANY TEST RESULTS WILL BE SENT TO YOUR REFERRING DOCTOR.   Zometa infusions monthly with lab work prior each time. RTC in 1 month to see MD.   I acknowledge that I have been informed and understand all the instructions given to me and received a copy. I do not have any more questions at this time, but understand that I may call the Specialty Clinic at [facility] at [phone] during business hours should I have any further questions or need assistance in obtaining follow-up care.    __________________________________________  _____________  __________ Signature of Patient or Authorized Representative            Date                   Time    __________________________________________ Nurse's Signature

## 2012-01-12 NOTE — Progress Notes (Signed)
Franklin Font, MD [place] [address]  1. Multiple myeloma  Pancrelipase, Lip-Prot-Amyl, 24000 UNITS CPEP, CBC with Differential, Comprehensive metabolic panel, Multiple myeloma panel, serum, Kappa/lambda light chains, CBC with Differential, Comprehensive metabolic panel, Multiple myeloma panel, serum, Kappa/lambda light chains    CURRENT THERAPY: All multiple myeloma chemotherapies on hold due to diarrhea.  INTERVAL HISTORY: [name] 51 y.o. male returns for  regular  visit for followup of  Stage II multiple myeloma with a beta 2 microglobulin level less than 3.5 and albumin of 3.4, and of course, he presented with Kappa light chain disease.  Presently in remission.  [name] is being seen by Dr. Laural Golden (GI) for his diarrhea.  He was recently started on Creon.  [name] reports that it worked to harden his stools, but it worked too well.  He reports that he takes 8 capsules of Creon.  He did this and became constipated.  He decreased the dose on his own to 6, and then most recently 4.  When he took 6, his stools were soft and formed, but now with 4 per day, he is loose again.  So I encouraged him to go back up to at least 6 daily and contact Dr. Laural Golden for more instructions.  He admits that he will do this.  He is fearful of becoming constipated.   We will continue with Zometa infusions every 4 weeks.  He is due for this this week.  We will ascertain blood work at that time as well.  When his diarrhea is controlled/resolved, will start Revilmid maintenance dose.  We will allow the pharmacy to calculate the dose.   Otherwise, [name] denies any complaints.  He is accompanied by his wife.  He is agreeable to the aforementioned plan.    Past Medical History  Diagnosis Date  . Arteriosclerotic cardiovascular disease (ASCVD)     MI-2000s; stent to the proximal LAD and diagonal in 2001; stress nuclear in 2008-impaired exercise capacity, left ventricular dilatation,  moderately to severely depressed EF, apical, inferior and anteroseptal scar  . Sleep apnea   . Hypertension   . 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 Multiple myeloma 07/01/2011  . Diabetes mellitus     Insulin  . GERD (gastroesophageal reflux disease)   . Pedal edema     Venous insufficiency  . Obesity   . Hyperlipidemia   . Chronic kidney disease, stage 3, mod decreased GFR     Creatinine of 1.84 in 06/2011 and 1.5 in 07/2011  . Injection Site Reaction     has DIABETES MELLITUS, TYPE II, ON INSULIN; Morbid obesity; SLEEP APNEA; Multiple myeloma; Arteriosclerotic cardiovascular disease (ASCVD); Hypertension; Hyperlipidemia; Acute on chronic renal failure; Diarrhea; Orthostasis; Edema of both legs; Systolic dysfunction; and Dehydration on his problem list.      has no known allergies.  [name] had no medications administered during this visit.  Past Surgical History  Procedure Date  . Laparoscopic gastric banding 2006  . Wrist surgery     Left; removal of bone fragment  . Incision and drainage abscess anal   . Abscess drainage     Scrotal  . Bone marrow biopsy 05/13/11  . Portacath placement 07/07/2011    Procedure: INSERTION PORT-A-CATH;  Surgeon: Scherry Ran, MD;  Location: [facility];  Service: General;  Laterality: N/A;  . Colonoscopy 11/28/2011    Procedure: COLONOSCOPY;  Surgeon: Rogene Houston, MD;  Location: [facility];  Service: Endoscopy;  Laterality:  N/A;  930  . Esophagogastroduodenoscopy 01/02/2012    Procedure: ESOPHAGOGASTRODUODENOSCOPY (EGD);  Surgeon: Rogene Houston, MD;  Location: [facility];  Service: Endoscopy;  Laterality: N/A;  100  . Esophageal biopsy 01/02/2012    Procedure: BIOPSY;  Surgeon: Rogene Houston, MD;  Location: [facility];  Service: Endoscopy;  Laterality: N/A;    Denies any headaches, dizziness, double vision, fevers, chills, night sweats, nausea, vomiting, diarrhea, constipation, chest pain, heart palpitations, shortness of  breath, blood in stool, black tarry stool, urinary pain, urinary burning, urinary frequency, hematuria.   PHYSICAL EXAMINATION  ECOG PERFORMANCE STATUS: 1 - Symptomatic but completely ambulatory  Filed Vitals:   01/12/12 1443  BP: 118/77  Pulse: 84  Temp: 98.1 F (36.7 C)  Resp: 20    GENERAL:alert, no distress, well nourished, well developed, comfortable, cooperative, obese and smiling SKIN: skin color, texture, turgor are normal, no rashes or significant lesions HEAD: Normocephalic, No masses, lesions, tenderness or abnormalities EYES: normal, Conjunctiva are pink and non-injected EARS: External ears normal OROPHARYNX:lips, buccal mucosa, and tongue normal and mucous membranes are moist  NECK: supple, trachea midline LYMPH:  no palpable lymphadenopathy BREAST:not examined LUNGS: clear to auscultation and percussion HEART: regular rate & rhythm, no murmurs, no gallops, S1 normal and S2 normal ABDOMEN:abdomen soft, non-tender, obese and normal bowel sounds BACK: Back symmetric, no curvature. EXTREMITIES:less then 2 second capillary refill, no joint deformities, effusion, or inflammation, no skin discoloration, no clubbing, no cyanosis.  Compression stockings in place B/L. NEURO: alert & oriented x 3 with fluent speech, no focal motor/sensory deficits, gait normal    LABORATORY DATA: Results for [name], [name] (MRN [id number]) as of 01/12/2012 16:00  Ref. Range 12/16/2011 10:55 12/16/2011 10:55  Sodium Latest Range: 135-145 mEq/L  132 (L)  Potassium Latest Range: 3.5-5.1 mEq/L  2.8 (L)  Chloride Latest Range: 96-112 mEq/L  103  CO2 Latest Range: 19-32 mEq/L  17 (L)  BUN Latest Range: 6-23 mg/dL  34 (H)  Creat Latest Range: 0.50-1.35 mg/dL  1.70 (H)  Calcium Latest Range: 8.4-10.5 mg/dL  9.4  GFR calc non Af Amer Latest Range: >90 mL/min  45 (L)  GFR calc Af Amer Latest Range: >90 mL/min  52 (L)  Glucose Latest Range: 70-99 mg/dL  108 (H)  Alkaline Phosphatase Latest  Range: 39-117 U/L  88  Albumin Latest Range: 3.5-5.2 g/dL  3.3 (L)  AST Latest Range: 0-37 U/L  20  ALT Latest Range: 0-53 U/L  24  Total Protein Latest Range: 6.0-8.3 g/dL 6.2 6.7  Total Bilirubin Latest Range: 0.3-1.2 mg/dL  0.4  Kappa free light chain Latest Range: 0.33-1.94 mg/dL 1.11   Albumin ELP Latest Range: 55.8-66.1 % 54.7 (L)   Alpha-1-Globulin Latest Range: 2.9-4.9 % 8.9 (H)   Alpha-2-Globulin Latest Range: 7.1-11.8 % 17.1 (H)   Beta Globulin Latest Range: 4.7-7.2 % 6.0   Beta 2 Latest Range: 3.2-6.5 % 5.7   Gamma Globulin Latest Range: 11.1-18.8 % 7.6 (L)   M-SPIKE, % No range found NOT DETECTED   SPE Interp. No range found (NOTE)   Comment No range found (NOTE)   IgG (Immunoglobin G), Serum Latest Range: 740 473 8969 mg/dL 477 (L)   IgA Latest Range: 68-379 mg/dL 68   IgM, Serum Latest Range: 41-251 mg/dL 46 (L)   Lamda free light chains Latest Range: 0.57-2.63 mg/dL 0.78   Kappa, lamda light chain ratio Latest Range: 0.26-1.65  1.42   WBC Latest Range: 4.0-10.5 K/uL 9.6   RBC  Latest Range: 4.22-5.81 MIL/uL 3.95 (L)   Hemoglobin Latest Range: 13.0-17.0 g/dL 12.3 (L)   HCT Latest Range: 39.0-52.0 % 36.0 (L)   MCV Latest Range: 78.0-100.0 fL 91.1   MCH Latest Range: 26.0-34.0 pg 31.1   MCHC Latest Range: 30.0-36.0 g/dL 34.2   RDW Latest Range: 11.5-15.5 % 16.0 (H)   Platelets Latest Range: 150-400 K/uL 187   Neutrophils Relative Latest Range: 43-77 % 63   Lymphocytes Relative Latest Range: 12-46 % 24   Monocytes Relative Latest Range: 3-12 % 12   Eosinophils Relative Latest Range: 0-5 % 1   Basophils Relative Latest Range: 0-1 % 0   NEUT# Latest Range: 1.7-7.7 K/uL 6.0   Lymphocytes Absolute Latest Range: 0.7-4.0 K/uL 2.3   Monocytes Absolute Latest Range: 0.1-1.0 K/uL 1.2 (H)   Eosinophils Absolute Latest Range: 0.0-0.7 K/uL 0.1   Basophils Absolute Latest Range: 0.0-0.1 K/uL 0.0       ASSESSMENT:  1. Stage II multiple myeloma with a beta 2 microglobulin level  less than 3.5 and albumin of 3.4, and of course, he presented with Kappa light chain disease.  Now in remission and multiple myeloma therapy on hold due to #3. 2. Morbid obesity, status post an attempt at gastric bypass surgery in the past.   3. Steatorrhea   PLAN:  1. I personally reviewed and went over laboratory results with the patient. 2. Lab work with Zometa infusion: CBC diff, CMET, MM panel. 3. Zometa infusion this week.  4. Follow-up appointment with Dr. Laural Golden on 01/19/2012.  He will contact Dr. Laural Golden tomorrow regarding Creon dose. 5. Last Zometa infusion on 12/08/2011 6. Maintenance Revilmid, dose per pharmacy, when diarrhea controlled/resolved. 7. Return in 4 weeks for follow-up.   All questions were answered. The patient knows to call the clinic with any problems, questions or concerns. We can certainly see the patient much sooner if necessary.  The patient and plan discussed with Sophronia Simas, MD and he is in agreement with the aforementioned.  KEFALAS,[name]

## 2012-01-14 ENCOUNTER — Encounter: Payer: Self-pay | Admitting: Oncology

## 2012-01-16 ENCOUNTER — Encounter (HOSPITAL_BASED_OUTPATIENT_CLINIC_OR_DEPARTMENT_OTHER): Payer: PRIVATE HEALTH INSURANCE

## 2012-01-16 DIAGNOSIS — C9001 Multiple myeloma in remission: Secondary | ICD-10-CM

## 2012-01-16 DIAGNOSIS — C9000 Multiple myeloma not having achieved remission: Secondary | ICD-10-CM

## 2012-01-16 LAB — COMPREHENSIVE METABOLIC PANEL
ALT: 75 U/L — ABNORMAL HIGH (ref 0–53)
AST: 47 U/L — ABNORMAL HIGH (ref 0–37)
Albumin: 3.4 g/dL — ABNORMAL LOW (ref 3.5–5.2)
Alkaline Phosphatase: 95 U/L (ref 39–117)
BUN: 27 mg/dL — ABNORMAL HIGH (ref 6–23)
CO2: 21 mEq/L (ref 19–32)
Calcium: 9.2 mg/dL (ref 8.4–10.5)
Chloride: 107 mEq/L (ref 96–112)
Creatinine, Ser: 1.32 mg/dL (ref 0.50–1.35)
GFR calc Af Amer: 70 mL/min — ABNORMAL LOW (ref >90)
GFR calc non Af Amer: 60 mL/min — ABNORMAL LOW (ref >90)
Glucose, Bld: 168 mg/dL — ABNORMAL HIGH (ref 70–99)
Potassium: 3.4 mEq/L — ABNORMAL LOW (ref 3.5–5.1)
Sodium: 142 mEq/L (ref 135–145)
Total Bilirubin: 0.3 mg/dL (ref 0.3–1.2)
Total Protein: 6.2 g/dL (ref 6.0–8.3)

## 2012-01-16 MED ORDER — HEPARIN SOD (PORK) LOCK FLUSH 100 UNIT/ML IV SOLN
INTRAVENOUS | Status: AC
  Filled 2012-01-16: qty 5

## 2012-01-16 MED ORDER — SODIUM CHLORIDE 0.9 % IJ SOLN
10.0000 mL | INTRAMUSCULAR | Status: DC | PRN
  Administered 2012-01-16: 10 mL via INTRAVENOUS
  Filled 2012-01-16: qty 10

## 2012-01-16 MED ORDER — HEPARIN SOD (PORK) LOCK FLUSH 100 UNIT/ML IV SOLN
250.0000 [IU] | Freq: Once | INTRAVENOUS | Status: AC | PRN
  Administered 2012-01-16: 500 [IU]
  Filled 2012-01-16: qty 5

## 2012-01-16 MED ORDER — ZOLEDRONIC ACID 4 MG/5ML IV CONC
2.0000 mg | Freq: Once | INTRAVENOUS | Status: AC
  Administered 2012-01-16: 2 mg via INTRAVENOUS
  Filled 2012-01-16: qty 2.5

## 2012-01-16 MED ORDER — SODIUM CHLORIDE 0.9 % IV SOLN
INTRAVENOUS | Status: DC
  Administered 2012-01-16: 13:00:00 via INTRAVENOUS

## 2012-01-16 MED ORDER — SODIUM CHLORIDE 0.9 % IJ SOLN
INTRAMUSCULAR | Status: AC
  Filled 2012-01-16: qty 10

## 2012-01-19 ENCOUNTER — Encounter (INDEPENDENT_AMBULATORY_CARE_PROVIDER_SITE_OTHER): Payer: Self-pay | Admitting: Internal Medicine

## 2012-01-19 ENCOUNTER — Ambulatory Visit (INDEPENDENT_AMBULATORY_CARE_PROVIDER_SITE_OTHER): Payer: PRIVATE HEALTH INSURANCE | Admitting: Internal Medicine

## 2012-01-19 VITALS — BP 120/80 | HR 78 | Temp 97.3°F | Resp 20 | Ht 75.0 in | Wt 349.6 lb

## 2012-01-19 DIAGNOSIS — K909 Intestinal malabsorption, unspecified: Secondary | ICD-10-CM

## 2012-01-19 DIAGNOSIS — K9089 Other intestinal malabsorption: Secondary | ICD-10-CM

## 2012-01-19 MED ORDER — CIPROFLOXACIN HCL 500 MG PO TABS: 500.0000 mg | ORAL_TABLET | Freq: Two times a day (BID) | ORAL | Status: AC

## 2012-01-19 NOTE — Patient Instructions (Signed)
Keep stool diary for the next 2 weeks. Continue Creon as before. Ciprofloxacin 500 mg by mouth twice daily. Progress report in 2 weeks.

## 2012-01-19 NOTE — Progress Notes (Signed)
Presenting complaint;  Followup for steatorrhea.  Subjective:  Patient is 51 year old African male who is here for scheduled visit regarding loose bowel movements. His diarrhea started several weeks ago while he was getting chemotherapy for multiple myeloma. Diarrhea did not resolve on stopping chemotherapy. He did not respond to symptomatic therapy. Stool studies were negative. Colonoscopy with sigmoid biopsy was also unremarkable. Small bowel follow-through was normal with normal transit time of 1.5 hours. EGD reveals large amount of food debris in the stomach with patent pylorus suggesting gastroparesis. Duodenal biopsy revealed no abnormality. He was spilling large amount of fat in his stool; he was therefore begun on pancreatic enzyme supplement about 12 days ago. He has kept stool diary as recommended. He feels some better. In the last 11 days he has 17 stools. Most of the stools were loose to semi-formed. 2 days ago he went all day without a bowel movement and one day he had hard stool. He has gained 5 pounds the last one week. He continues to complain of flatulence and intermittent gas pain across his upper abdomen. He is not using diphenoxylate on as-needed basis. He was seen at oncology clinic last week by Despina Arias PA. Maintenance therapy is on hold for his multiple myeloma. He remains in remission at present time.   Current Medications: Current Outpatient Prescriptions  Medication Sig Dispense Refill  . allopurinol (ZYLOPRIM) 300 MG tablet Take 300 mg by mouth daily.      Marland Kitchen aspirin EC 325 MG tablet Take 325 mg by mouth every morning.       . carvedilol (COREG) 3.125 MG tablet Take 1 tablet (3.125 mg total) by mouth 2 (two) times daily.  60 tablet  6  . Cholecalciferol (VITAMIN D) 2000 UNITS CAPS Take 1 capsule by mouth every morning.       Marland Kitchen dexamethasone (DECADRON) 4 MG tablet Take 20 mg by mouth 2 (two) times daily. every Friday.      Marland Kitchen dexlansoprazole (DEXILANT) 60 MG capsule  Take 60 mg by mouth every morning.       . diphenoxylate-atropine (LOMOTIL) 2.5-0.025 MG per tablet Take two tablets PO after each loose stool.  Max 8 per day  50 tablet  1  . insulin aspart (NOVOLOG) 100 UNIT/ML injection Inject 50 Units into the skin as needed. Sliding scale      . insulin glargine (LANTUS) 100 UNIT/ML injection Inject 60 Units into the skin daily before breakfast.      . L-Methylfolate-B6-B12 (METANX PO) Take 1 tablet by mouth every morning.       . lidocaine-prilocaine (EMLA) cream Apply 1 application topically as needed. To port site. Apply 1 hour prior to treatment. Do not rub in. Cover with plastic.      . Liraglutide (VICTOZA) 18 MG/3ML SOLN Inject 1.2 mg into the skin every evening.       . loratadine (CLARITIN) 10 MG tablet Take 10 mg by mouth every morning.       . Multiple Vitamins-Minerals (MULTIVITAMINS THER. W/MINERALS) TABS Take 1 tablet by mouth every morning.        . NON FORMULARY Herbal life shake daily      . ondansetron (ZOFRAN) 8 MG tablet Take 8 mg by mouth 2 (two) times daily as needed. Nausea/vomiting. Patient to also take this 30 minutes to 1 hour prior to Velcade injections.      . Pancrelipase, Lip-Prot-Amyl, 24000 UNITS CPEP Take 2 capsules by mouth 3 (three) times daily with meals.  Take 2 capsules with each meal and 1 capsule with each snack      . potassium chloride (K-DUR,KLOR-CON) 10 MEQ tablet Take 1 tablet (10 mEq total) by mouth 2 (two) times daily.  60 tablet  2  . rosuvastatin (CRESTOR) 10 MG tablet Take 10 mg by mouth every morning.       . sulfamethoxazole-trimethoprim (BACTRIM DS,SEPTRA DS) 800-160 MG per tablet Take 1 tablet by mouth 3 (three) times a week. To start Jul 07, 2011 (to take MWF while on chemo)      . Tadalafil (CIALIS PO) Take by mouth as needed.        . torsemide (DEMADEX) 20 MG tablet Take 40 mg by mouth daily.      . Zoledronic Acid (ZOMETA IV) Inject 2 mg into the vein every 28 (twenty-eight) days. To start Jul 11, 2011       . acyclovir (ZOVIRAX) 400 MG tablet Take 1 tablet (400 mg total) by mouth every morning. To start Jul 07, 2011 (to take daily while on chemo)  30 tablet  2     Objective: Blood pressure 120/80, pulse 78, temperature 97.3 F (36.3 C), temperature source Oral, resp. rate 20, height 6\' 3"  (1.905 m), weight 349 lb 9.6 oz (158.578 kg). Patient is alert and in no acutedistress. Conjunctiva is pink. Sclera is nonicteric Oropharyngeal mucosa is normal. No neck masses or thyromegaly noted. Cardiac exam with regular rhythm normal S1 and S2. No murmur or gallop noted. Lungs are clear to auscultation. Abdomen is obese with hyperactive bowel sounds. Abdomen is soft with mild epigastric tenderness but no organomegaly or masses.  He has trace edema around his ankles.  Labs/studies Results: Stool weight 3017 g/24 hr. Fecal fat 91 g/24 hr  Assessment:  Patient has steatorrhea with high fecal fat value. Duodenal biopsy was negative for mucosal disease. He was begun on pancreatic enzyme supplement for possible pancreatic insufficiency. He is 50% better. He is having in average of 1.6 stools per day since he was begun on pancreatic enzyme supplement. Since he hasn't responded 100% he could have an additional source to account for his malabsorptive syndrome. He may also have small bowel bacterial overgrowth.   Plan:  Continue pancreatic enzyme supplement at current dose. Samples provided to the patient. Cipro 500 mg by mouth twice a day for 2 weeks. Patient will keep symptom diary and call us with progress report in 2 weeks. Office visit in 2 months.

## 2012-01-23 ENCOUNTER — Other Ambulatory Visit (HOSPITAL_COMMUNITY): Payer: Self-pay

## 2012-01-23 ENCOUNTER — Telehealth (HOSPITAL_COMMUNITY): Payer: Self-pay | Admitting: Oncology

## 2012-01-23 ENCOUNTER — Telehealth (HOSPITAL_COMMUNITY): Payer: Self-pay | Admitting: *Deleted

## 2012-01-23 NOTE — Telephone Encounter (Signed)
[facility] rx [phone] Advised csr that pt's [name] has been put on hold and Will be re-evaluated on 02/09/12 his next office visit.

## 2012-01-23 NOTE — Telephone Encounter (Signed)
Refills x 2 for Bactrim called into [facility] and patient was notified.

## 2012-01-23 NOTE — Telephone Encounter (Signed)
Message copied by Mellissa Kohut on Fri Jan 23, 2012  5:33 PM ------      Message from: [name], ERIC S      Created: Fri Jan 23, 2012  4:57 PM       May refill bactrim X 2

## 2012-01-26 ENCOUNTER — Other Ambulatory Visit (HOSPITAL_COMMUNITY): Payer: Self-pay

## 2012-01-26 DIAGNOSIS — C9000 Multiple myeloma not having achieved remission: Secondary | ICD-10-CM

## 2012-01-26 MED ORDER — SULFAMETHOXAZOLE-TRIMETHOPRIM 800-160 MG PO TABS: 1.0000 | ORAL_TABLET | ORAL | Status: DC

## 2012-02-09 ENCOUNTER — Ambulatory Visit (HOSPITAL_COMMUNITY): Payer: PRIVATE HEALTH INSURANCE | Admitting: Oncology

## 2012-02-10 ENCOUNTER — Other Ambulatory Visit (HOSPITAL_COMMUNITY): Payer: Self-pay | Admitting: Oncology

## 2012-02-10 DIAGNOSIS — R197 Diarrhea, unspecified: Secondary | ICD-10-CM

## 2012-02-10 MED ORDER — DIPHENOXYLATE-ATROPINE 2.5-0.025 MG PO TABS: ORAL_TABLET | ORAL | Status: DC

## 2012-02-13 ENCOUNTER — Encounter (HOSPITAL_COMMUNITY): Payer: PRIVATE HEALTH INSURANCE | Attending: Oncology

## 2012-02-13 ENCOUNTER — Encounter (HOSPITAL_BASED_OUTPATIENT_CLINIC_OR_DEPARTMENT_OTHER): Payer: PRIVATE HEALTH INSURANCE | Admitting: Oncology

## 2012-02-13 ENCOUNTER — Encounter (HOSPITAL_COMMUNITY): Payer: PRIVATE HEALTH INSURANCE

## 2012-02-13 VITALS — BP 121/77 | HR 74 | Temp 98.1°F | Resp 20

## 2012-02-13 DIAGNOSIS — K9089 Other intestinal malabsorption: Secondary | ICD-10-CM

## 2012-02-13 DIAGNOSIS — C9001 Multiple myeloma in remission: Secondary | ICD-10-CM

## 2012-02-13 DIAGNOSIS — C9000 Multiple myeloma not having achieved remission: Secondary | ICD-10-CM | POA: Insufficient documentation

## 2012-02-13 LAB — CBC WITH DIFFERENTIAL/PLATELET
Basophils Absolute: 0 10*3/uL (ref 0.0–0.1)
Basophils Relative: 0 % (ref 0–1)
Eosinophils Absolute: 0.1 10*3/uL (ref 0.0–0.7)
Eosinophils Relative: 1 % (ref 0–5)
HCT: 34.1 % — ABNORMAL LOW (ref 39.0–52.0)
Hemoglobin: 11.4 g/dL — ABNORMAL LOW (ref 13.0–17.0)
Lymphocytes Relative: 18 % (ref 12–46)
Lymphs Abs: 1.3 10*3/uL (ref 0.7–4.0)
MCH: 31.5 pg (ref 26.0–34.0)
MCHC: 33.4 g/dL (ref 30.0–36.0)
MCV: 94.2 fL (ref 78.0–100.0)
Monocytes Absolute: 0.4 10*3/uL (ref 0.1–1.0)
Monocytes Relative: 6 % (ref 3–12)
Neutro Abs: 5.5 10*3/uL (ref 1.7–7.7)
Neutrophils Relative %: 75 % (ref 43–77)
Platelets: 182 10*3/uL (ref 150–400)
RBC: 3.62 MIL/uL — ABNORMAL LOW (ref 4.22–5.81)
RDW: 14 % (ref 11.5–15.5)
WBC: 7.2 10*3/uL (ref 4.0–10.5)

## 2012-02-13 LAB — COMPREHENSIVE METABOLIC PANEL
ALT: 77 U/L — ABNORMAL HIGH (ref 0–53)
AST: 52 U/L — ABNORMAL HIGH (ref 0–37)
Albumin: 3.2 g/dL — ABNORMAL LOW (ref 3.5–5.2)
Alkaline Phosphatase: 104 U/L (ref 39–117)
BUN: 25 mg/dL — ABNORMAL HIGH (ref 6–23)
CO2: 25 mEq/L (ref 19–32)
Calcium: 9.3 mg/dL (ref 8.4–10.5)
Chloride: 106 mEq/L (ref 96–112)
Creatinine, Ser: 1.4 mg/dL — ABNORMAL HIGH (ref 0.50–1.35)
GFR calc Af Amer: 65 mL/min — ABNORMAL LOW (ref >90)
GFR calc non Af Amer: 56 mL/min — ABNORMAL LOW (ref >90)
Glucose, Bld: 125 mg/dL — ABNORMAL HIGH (ref 70–99)
Potassium: 3.6 mEq/L (ref 3.5–5.1)
Sodium: 141 mEq/L (ref 135–145)
Total Bilirubin: 0.3 mg/dL (ref 0.3–1.2)
Total Protein: 6 g/dL (ref 6.0–8.3)

## 2012-02-13 MED ORDER — ZOLEDRONIC ACID 4 MG/5ML IV CONC
2.0000 mg | Freq: Once | INTRAVENOUS | Status: AC
  Administered 2012-02-13: 2 mg via INTRAVENOUS
  Filled 2012-02-13: qty 2.5

## 2012-02-13 MED ORDER — SODIUM CHLORIDE 0.9 % IJ SOLN
10.0000 mL | INTRAMUSCULAR | Status: DC | PRN
  Administered 2012-02-13: 10 mL via INTRAVENOUS
  Filled 2012-02-13: qty 10

## 2012-02-13 MED ORDER — HEPARIN SOD (PORK) LOCK FLUSH 100 UNIT/ML IV SOLN
500.0000 [IU] | Freq: Once | INTRAVENOUS | Status: AC
  Administered 2012-02-13: 500 [IU] via INTRAVENOUS
  Filled 2012-02-13: qty 5

## 2012-02-13 MED ORDER — HEPARIN SOD (PORK) LOCK FLUSH 100 UNIT/ML IV SOLN
250.0000 [IU] | Freq: Once | INTRAVENOUS | Status: DC | PRN
  Filled 2012-02-13: qty 5

## 2012-02-13 MED ORDER — SODIUM CHLORIDE 0.9 % IV SOLN
INTRAVENOUS | Status: DC
  Administered 2012-02-13: 12:00:00 via INTRAVENOUS

## 2012-02-13 MED ORDER — HEPARIN SOD (PORK) LOCK FLUSH 100 UNIT/ML IV SOLN
INTRAVENOUS | Status: AC
  Filled 2012-02-13: qty 5

## 2012-02-13 NOTE — Progress Notes (Signed)
Charted this visit on infusion encounter,

## 2012-02-13 NOTE — Patient Instructions (Addendum)
[facility]  Discharge Instructions  RECOMMENDATIONS MADE BY THE CONSULTANT AND ANY TEST RESULTS WILL BE SENT TO YOUR REFERRING DOCTOR.   EXAM FINDINGS BY MD TODAY AND SIGNS AND SYMPTOMS TO REPORT TO CLINIC OR PRIMARY MD: exam and discussion by PA.  Report any new lumps, bone pain, shortness of breath, night sweats, etc.  We are waiting to find out from Dr. Laural Golden and Dr. [name] to determine when to restart your revlimid.  MEDICATIONS PRESCRIBED: none      SPECIAL INSTRUCTIONS/FOLLOW-UP: Lab work Needed in 4 weeks, zometa in 4 weeks and Return to Clinic to see MD in 4 weeks.   I acknowledge that I have been informed and understand all the instructions given to me and received a copy. I do not have any more questions at this time, but understand that I may call the Specialty Clinic at [facility] at [phone] during business hours should I have any further questions or need assistance in obtaining follow-up care.    __________________________________________  _____________  __________ Signature of Patient or Authorized Representative            Date                   Time    __________________________________________ Nurse's Signature

## 2012-02-13 NOTE — Progress Notes (Signed)
Tolerated zometa infusion well.

## 2012-02-13 NOTE — Progress Notes (Signed)
[name], MD [place] [address]  1. Multiple myeloma     CURRENT THERAPY: On hold due to steatorrhea  INTERVAL HISTORY: [name] 51 y.o. male returns for  regular  visit for followup of  Stage II multiple myeloma with a beta 2 microglobulin level less than 3.5 and albumin of 3.4, and of course, he presented with Kappa light chain disease. Presently in remission.  [name] is here today for his Zometa infusion.  Lab work reveals stability in his renal function and he is clear to have his IV Zometa 2 mg today.  His Multiple Myeloma panel was completed today and those results are pending.    His chemotherapy has been on hold due to his steatorrhea which is being evaluated by Dr. Laural Golden.  When his loose stools are under control we would start maintenance [name], pharmacy to dose.  I have reached out to Dr. Laural Golden via [facility] messaging system about this.  [name] reports that his loose stools are under much better control and he is having about 2 loose stools daily.    He reports some RUQ pain that he notices with movement.  He denies any bone pain or rib pain.  He denies any worsening of the symptoms with when lying flat.  He denies any increased burping or belching.  He denies any chest pain.  It is likely muscular strain.  Otherwise, [name] denies any complaints.  Complete ROS questioning is negative.    Past Medical History  Diagnosis Date  . Arteriosclerotic cardiovascular disease (ASCVD)     MI-2000s; stent to the proximal LAD and diagonal in 2001; stress nuclear in 2008-impaired exercise capacity, left ventricular dilatation, moderately to severely depressed EF, apical, inferior and anteroseptal scar  . Sleep apnea   . Hypertension   . 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 Multiple myeloma 07/01/2011  . Diabetes mellitus     Insulin  . GERD (gastroesophageal reflux disease)   . Pedal edema     Venous insufficiency  . Obesity   . Hyperlipidemia     . Chronic kidney disease, stage 3, mod decreased GFR     Creatinine of 1.84 in 06/2011 and 1.5 in 07/2011  . Injection Site Reaction     has DIABETES MELLITUS, TYPE II, ON INSULIN; Morbid obesity; SLEEP APNEA; Multiple myeloma; Arteriosclerotic cardiovascular disease (ASCVD); Hypertension; Hyperlipidemia; Acute on chronic renal failure; Diarrhea; Orthostasis; Edema of both legs; Systolic dysfunction; and Dehydration on his problem list.      has no known allergies.  [name] does not currently have medications on file.  Past Surgical History  Procedure Date  . Laparoscopic gastric banding 2006  . Wrist surgery     Left; removal of bone fragment  . Incision and drainage abscess anal   . Abscess drainage     Scrotal  . Bone marrow biopsy 05/13/11  . Portacath placement 07/07/2011    Procedure: INSERTION PORT-A-CATH;  Surgeon: Scherry Ran, MD;  Location: [facility] ORS;  Service: General;  Laterality: N/A;  . Colonoscopy 11/28/2011    Procedure: COLONOSCOPY;  Surgeon: Rogene Houston, MD;  Location: [facility] ENDO SUITE;  Service: Endoscopy;  Laterality: N/A;  930  . Esophagogastroduodenoscopy 01/02/2012    Procedure: ESOPHAGOGASTRODUODENOSCOPY (EGD);  Surgeon: Rogene Houston, MD;  Location: [facility] END[facility] SUITE;  Service: Endoscopy;  Laterality: N/A;  100  . Esophageal biopsy 01/02/2012    Procedure: BIOPSY;  Surgeon: Rogene Houston, MD;  Location: [facility]  ENDO SUITE;  Service: Endoscopy;  Laterality: N/A;    Denies any headaches, dizziness, double vision, fevers, chills, night sweats, nausea, vomiting, diarrhea, constipation, chest pain, heart palpitations, shortness of breath, blood in stool, black tarry stool, urinary pain, urinary burning, urinary frequency, hematuria.   PHYSICAL EXAMINATION  ECOG PERFORMANCE STATUS: 1 - Symptomatic but completely ambulatory  There were no vitals filed for this visit.  GENERAL:alert, no distress, well nourished, well developed, comfortable, cooperative, obese and  smiling SKIN: skin color, texture, turgor are normal, no rashes or significant lesions HEAD: Normocephalic, No masses, lesions, tenderness or abnormalities EYES: normal, Conjunctiva are pink and non-injected EARS: External ears normal OROPHARYNX:lips, buccal mucosa, and tongue normal and mucous membranes are moist  NECK: supple, no adenopathy, thyroid normal size, non-tender, without nodularity, no stridor, non-tender, trachea midline LYMPH:  no palpable lymphadenopathy, no hepatosplenomegaly BREAST:not examined LUNGS: clear to auscultation and percussion HEART: regular rate & rhythm, no murmurs, no gallops, S1 normal and S2 normal ABDOMEN:abdomen soft, non-tender, obese, normal bowel sounds, no masses or organomegaly, no increased tenderness on palpation of RUQ, but he does note some minimal tenderness on deep palpitation in the epigastric region, and no hepatosplenomegaly BACK: Back symmetric, no curvature., No CVA tenderness EXTREMITIES:less then 2 second capillary refill, no joint deformities, effusion, or inflammation, no edema, no skin discoloration, no clubbing, no cyanosis  NEURO: alert & oriented x 3 with fluent speech, no focal motor/sensory deficits, gait normal    LABORATORY DATA: CBC    Component Value Date/Time   WBC 7.2 02/13/2012 1152   RBC 3.62* 02/13/2012 1152   HGB 11.4* 02/13/2012 1152   HCT 34.1* 02/13/2012 1152   PLT 182 02/13/2012 1152   MCV 94.2 02/13/2012 1152   MCH 31.5 02/13/2012 1152   MCHC 33.4 02/13/2012 1152   RDW 14.0 02/13/2012 1152   LYMPHSABS 1.3 02/13/2012 1152   MONOABS 0.4 02/13/2012 1152   EOSABS 0.1 02/13/2012 1152   BASOSABS 0.0 02/13/2012 1152      Chemistry      Component Value Date/Time   NA 141 02/13/2012 1152   K 3.6 02/13/2012 1152   CL 106 02/13/2012 1152   CO2 25 02/13/2012 1152   BUN 25* 02/13/2012 1152   CREATININE 1.40* 02/13/2012 1152   CREATININE 1.60* 10/20/2011 1758      Component Value Date/Time   CALCIUM 9.3 02/13/2012 1152    ALKPHOS 104 02/13/2012 1152   AST 52* 02/13/2012 1152   ALT 77* 02/13/2012 1152   BILITOT 0.3 02/13/2012 1152         ASSESSMENT:  1. Stage II multiple myeloma with a beta 2 microglobulin level less than 3.5 and albumin of 3.4, and of course, he presented with Kappa light chain disease. Now in remission and multiple myeloma therapy on hold due to #3.  2. Morbid obesity, status post an attempt at gastric bypass surgery in the past.  3. Steatorrhea   PLAN:  1. I personally reviewed and went over laboratory results with the patient. 2. IV Zometa 2 mg today 3. MM panel pending. 4. Message sent to Dr. Laural Golden (office closed) regarding Makarios's progress on steatorrhea.  5. Will start maintenance [name] (pharmacy to dose) when BMs are controlled.  6. Zometa every 4 weeks with lab work 7. Lab work in 4 weeks: CBC diff, CMET, MM Panel.  8. Will start maintenance [name] when able.  9. Return in 4 weeks for follow-up.   All questions were answered. The patient knows to  call the clinic with any problems, questions or concerns. We can certainly see the patient much sooner if necessary.  The patient and plan discussed with Curt Bears, MD and he is in agreement with the aforementioned.  [name]

## 2012-02-16 ENCOUNTER — Telehealth (INDEPENDENT_AMBULATORY_CARE_PROVIDER_SITE_OTHER): Payer: Self-pay | Admitting: *Deleted

## 2012-02-16 LAB — KAPPA/LAMBDA LIGHT CHAINS
Kappa free light chain: 1.65 mg/dL (ref 0.33–1.94)
Kappa, lambda light chain ratio: 1.26 (ref 0.26–1.65)
Lambda free light chains: 1.31 mg/dL (ref 0.57–2.63)

## 2012-02-17 ENCOUNTER — Other Ambulatory Visit (INDEPENDENT_AMBULATORY_CARE_PROVIDER_SITE_OTHER): Payer: Self-pay | Admitting: Internal Medicine

## 2012-02-17 LAB — MULTIPLE MYELOMA PANEL, SERUM
Albumin ELP: 58.4 % (ref 55.8–66.1)
Alpha-1-Globulin: 7.4 % — ABNORMAL HIGH (ref 2.9–4.9)
Alpha-2-Globulin: 14.6 % — ABNORMAL HIGH (ref 7.1–11.8)
Beta 2: 5.1 % (ref 3.2–6.5)
Beta Globulin: 6.8 % (ref 4.7–7.2)
Gamma Globulin: 7.7 % — ABNORMAL LOW (ref 11.1–18.8)
IgA: 64 mg/dL — ABNORMAL LOW (ref 68–379)
IgG (Immunoglobin G), Serum: 421 mg/dL — ABNORMAL LOW (ref 650–1600)
IgM, Serum: 62 mg/dL (ref 41–251)
M-Spike, %: NOT DETECTED g/dL
Total Protein: 5.4 g/dL — ABNORMAL LOW (ref 6.0–8.3)

## 2012-02-17 NOTE — Telephone Encounter (Signed)
Done

## 2012-02-17 NOTE — Telephone Encounter (Signed)
Wrong srcipt

## 2012-02-23 ENCOUNTER — Encounter (INDEPENDENT_AMBULATORY_CARE_PROVIDER_SITE_OTHER): Payer: Self-pay | Admitting: Internal Medicine

## 2012-02-23 ENCOUNTER — Ambulatory Visit (INDEPENDENT_AMBULATORY_CARE_PROVIDER_SITE_OTHER): Payer: PRIVATE HEALTH INSURANCE | Admitting: Internal Medicine

## 2012-02-23 VITALS — BP 128/80 | HR 78 | Temp 97.8°F | Resp 20 | Ht 75.0 in | Wt 355.1 lb

## 2012-02-23 DIAGNOSIS — K903 Pancreatic steatorrhea: Secondary | ICD-10-CM

## 2012-02-23 DIAGNOSIS — E1143 Type 2 diabetes mellitus with diabetic autonomic (poly)neuropathy: Secondary | ICD-10-CM

## 2012-02-23 DIAGNOSIS — E1149 Type 2 diabetes mellitus with other diabetic neurological complication: Secondary | ICD-10-CM

## 2012-02-23 DIAGNOSIS — E1142 Type 2 diabetes mellitus with diabetic polyneuropathy: Secondary | ICD-10-CM

## 2012-02-23 DIAGNOSIS — K3184 Gastroparesis: Secondary | ICD-10-CM

## 2012-02-23 NOTE — Patient Instructions (Addendum)
Can take 1-2 tablets of Lomotil or diphenoxylate every morning as needed.

## 2012-02-23 NOTE — Progress Notes (Signed)
Presenting complaint;  Followup for diarrhea/steatorrhea.  Subjective:  [name] is 51 year old [place] American male who is here for scheduled visit. He was initially seen 3 months ago for intractable diarrhea. Workup revealed steatorrhea or fat malabsorption. He had over 90 g of fat in the stool for 24 hours. About 6 weeks ago he was begun on pancreatic enzyme supplements. He he has Stool diary as recommended. He has gained 6 pounds since his last visit. He feels much better. He is having anywhere from one to 4 stools per day. Every now and then he has a hard stool. For the last month he had 2 stools 50% of the time, one stool 30% of the time and 3-4 stools 20% of the time. He has noted more diarrhea when he eat fatty foods. He continues to complain of epigastric pain which is relieved when he passes flatness. He denies heartburn regurgitation or foul odor burps. Today he's also complaining of pain in his right flank. He describes this to be a different pain more like a soreness. He is looking forward to this [facility] visit for consideration of bone marrow transplant. He is not sure if he is taking Lomotil on daily basis.  Current Medications: Current Outpatient Prescriptions  Medication Sig Dispense Refill  . acyclovir (ZOVIRAX) 400 MG tablet Take 1 tablet (400 mg total) by mouth every morning. To start Jul 07, 2011 (to take daily while on chemo)  30 tablet  2  . allopurinol (ZYLOPRIM) 300 MG tablet Take 300 mg by mouth daily.      Marland Kitchen aspirin EC 325 MG tablet Take 325 mg by mouth every morning.       . carvedilol (COREG) 3.125 MG tablet Take 1 tablet (3.125 mg total) by mouth 2 (two) times daily.  60 tablet  6  . Cholecalciferol (VITAMIN D) 2000 UNITS CAPS Take 1 capsule by mouth every morning.       Marland Kitchen dexamethasone (DECADRON) 4 MG tablet Take 20 mg by mouth 2 (two) times daily. every Friday.      Marland Kitchen dexlansoprazole (DEXILANT) 60 MG capsule Take 60 mg by mouth every morning.       .  diphenoxylate-atropine (LOMOTIL) 2.5-0.025 MG per tablet Take two tablets PO after each loose stool.  Max 8 per day  50 tablet  1  . insulin aspart (NOVOLOG) 100 UNIT/ML injection Inject 50 Units into the skin as needed. Sliding scale      . insulin glargine (LANTUS) 100 UNIT/ML injection Inject 60 Units into the skin daily before breakfast.      . L-Methylfolate-B6-B12 (METANX PO) Take 1 tablet by mouth every morning.       . lidocaine-prilocaine (EMLA) cream Apply 1 application topically as needed. To port site. Apply 1 hour prior to treatment. Do not rub in. Cover with plastic.      . Liraglutide (VICTOZA) 18 MG/3ML SOLN Inject 1.2 mg into the skin every evening.       . loratadine (CLARITIN) 10 MG tablet Take 10 mg by mouth every morning.       . Multiple Vitamins-Minerals (MULTIVITAMINS THER. W/MINERALS) TABS Take 1 tablet by mouth every morning.        . NON FORMULARY Herbal life shake daily      . ondansetron (ZOFRAN) 8 MG tablet Take 8 mg by mouth 2 (two) times daily as needed. Nausea/vomiting. [name] to also take this 30 minutes to 1 hour prior to Velcade injections.      Marland Kitchen  Pancrelipase, Lip-Prot-Amyl, 24000 UNITS CPEP Take 2 capsules by mouth 3 (three) times daily with meals. Take 2 capsules with each meal and 1 capsule with each snack      . potassium chloride (K-DUR,KLOR-CON) 10 MEQ tablet Take 1 tablet (10 mEq total) by mouth 2 (two) times daily.  60 tablet  2  . rosuvastatin (CRESTOR) 10 MG tablet Take 10 mg by mouth every morning.       . sulfamethoxazole-trimethoprim (BACTRIM DS,SEPTRA DS) 800-160 MG per tablet Take 1 tablet by mouth 3 (three) times a week. To start Jul 07, 2011 (to take MWF while on chemo)  12 tablet  2  . Tadalafil (CIALIS PO) Take by mouth as needed.        . torsemide (DEMADEX) 20 MG tablet Take 40 mg by mouth daily.      . Zoledronic Acid (ZOMETA IV) Inject 2 mg into the vein every 28 (twenty-eight) days. To start Jul 11, 2011         Objective: Blood  pressure 128/80, pulse 78, temperature 97.8 F (36.6 C), temperature source Oral, resp. rate 20, height 6\' 3"  (1.905 m), weight 355 lb 1.6 oz (161.072 kg). [name] is alert and appears to be comfortabe. Conjunctiva is pink. Sclera is nonicteric Oropharyngeal mucosa is normal. No neck masses or thyromegaly noted. Abdomen is obese with normal bowel sounds. Percussion note is tympanitic and epigastric region. No organomegaly or masses noted.  No LE edema or clubbing noted.  Assessment:  #1. Steatorrhea felt to be secondary to pancreatic exocrine insufficiency. There is no history of chronic pancreatitis. Therefore it has to be idiopathic. He was spinning very high fecal fat which would be unusual with steatorrhea due to other conditions. His weight gain even though very undesirable is reassuring and indicates improved G absorptive  function  #2. He appears to have diabetic gastroparesis he is based on endoscopic findings. He appears to be doing well with PPI and dietary measures.   Plan:  We'll check serum trypsin Ogen level with next blood draw at speciality clinic. He will continue pancreatic enzyme supplement at current dose. He must take with low fat diet. I do not see contraindication to initiate maintenance therapy for multiple myeloma. Office visit in 3 months.

## 2012-02-27 ENCOUNTER — Telehealth (INDEPENDENT_AMBULATORY_CARE_PROVIDER_SITE_OTHER): Payer: Self-pay | Admitting: *Deleted

## 2012-02-27 ENCOUNTER — Other Ambulatory Visit (HOSPITAL_COMMUNITY): Payer: Self-pay | Admitting: Oncology

## 2012-02-27 DIAGNOSIS — K3184 Gastroparesis: Secondary | ICD-10-CM

## 2012-02-27 DIAGNOSIS — E1149 Type 2 diabetes mellitus with other diabetic neurological complication: Secondary | ICD-10-CM

## 2012-02-27 DIAGNOSIS — C9000 Multiple myeloma not having achieved remission: Secondary | ICD-10-CM

## 2012-02-27 DIAGNOSIS — K903 Pancreatic steatorrhea: Secondary | ICD-10-CM

## 2012-02-27 MED ORDER — DEXAMETHASONE 4 MG PO TABS: 20.0000 mg | ORAL_TABLET | Freq: Two times a day (BID) | ORAL | Status: DC

## 2012-02-27 MED ORDER — LENALIDOMIDE 25 MG PO CAPS: ORAL_CAPSULE | ORAL | Status: DC

## 2012-02-27 NOTE — Telephone Encounter (Signed)
[name] left a message on the phone that he is having abdominal pain and that's it is really bad, having gas. He also has questions about the Victoza. Patient called . He says that the pain is in the center of his abd. He is not sure if it is gas or not. Patient ask if there is something that he can take for this. The Victoza , he says that he has read the side effect. They are diarrhea , slow gastric movement, pain  In the sides and into back. He is wondering if this is his problem? Patient ask that we call him back [phone] He was also made aware that a lab test was ordered for him and had been faxed to [facility].

## 2012-02-27 NOTE — Telephone Encounter (Signed)
Per Dr.Rehman the patient upon exam in office 02/23/12 , had a lot of flatus.  Patient should drink something that may help him to burp. Dr.Rehman advised that the patient talk with the PCP about Victoza. [place] was called and made aware. He plans to call Dr.Hill.

## 2012-02-27 NOTE — Telephone Encounter (Signed)
Per Dr.Rehman the patient will need to have these labs drawn. Faxed to [employer] [facility]

## 2012-02-29 NOTE — Telephone Encounter (Signed)
Talk with patient. He feels better when he burps or passes flatus. He will talk with Dr. [name] about coming off [place]. If symptoms persist will do ultrasound to rule out cholelithiasis

## 2012-03-04 ENCOUNTER — Encounter (HOSPITAL_COMMUNITY): Payer: PRIVATE HEALTH INSURANCE | Attending: Oncology | Admitting: Oncology

## 2012-03-04 ENCOUNTER — Ambulatory Visit (HOSPITAL_COMMUNITY)
Admission: RE | Admit: 2012-03-04 | Discharge: 2012-03-04 | Disposition: A | Discharge status: Home / Self Care | Payer: PRIVATE HEALTH INSURANCE | Source: Ambulatory Visit | Attending: Oncology | Admitting: Oncology

## 2012-03-04 VITALS — BP 121/83 | HR 76 | Temp 98.3°F | Resp 18 | Wt 351.0 lb

## 2012-03-04 DIAGNOSIS — L0390 Cellulitis, unspecified: Secondary | ICD-10-CM | POA: Insufficient documentation

## 2012-03-04 DIAGNOSIS — M25519 Pain in unspecified shoulder: Secondary | ICD-10-CM

## 2012-03-04 DIAGNOSIS — X58XXXA Exposure to other specified factors, initial encounter: Secondary | ICD-10-CM | POA: Insufficient documentation

## 2012-03-04 DIAGNOSIS — L0291 Cutaneous abscess, unspecified: Secondary | ICD-10-CM | POA: Insufficient documentation

## 2012-03-04 DIAGNOSIS — C9000 Multiple myeloma not having achieved remission: Secondary | ICD-10-CM | POA: Insufficient documentation

## 2012-03-04 DIAGNOSIS — M542 Cervicalgia: Secondary | ICD-10-CM | POA: Insufficient documentation

## 2012-03-04 DIAGNOSIS — T148XXA Other injury of unspecified body region, initial encounter: Secondary | ICD-10-CM | POA: Insufficient documentation

## 2012-03-04 DIAGNOSIS — M5380 Other specified dorsopathies, site unspecified: Secondary | ICD-10-CM | POA: Insufficient documentation

## 2012-03-04 MED ORDER — CYCLOBENZAPRINE HCL 10 MG PO TABS: 10.0000 mg | ORAL_TABLET | Freq: Three times a day (TID) | ORAL | Status: DC | PRN

## 2012-03-04 MED ORDER — HYDROCODONE-ACETAMINOPHEN 5-325 MG PO TABS
ORAL_TABLET | ORAL | Status: AC
  Filled 2012-03-04: qty 1

## 2012-03-04 MED ORDER — NAPROXEN 500 MG PO TABS: 500.0000 mg | ORAL_TABLET | Freq: Two times a day (BID) | ORAL | Status: DC

## 2012-03-04 MED ORDER — HYDROCODONE-ACETAMINOPHEN 5-325 MG PO TABS
1.0000 | ORAL_TABLET | Freq: Once | ORAL | Status: AC
  Administered 2012-03-04: 1 via ORAL
  Filled 2012-03-04: qty 1

## 2012-03-04 NOTE — Progress Notes (Signed)
Walk in today for pain in neck and down rt shoulder and arm x 2 days

## 2012-03-04 NOTE — Progress Notes (Signed)
[name] is seen as a work-in today for right shoulder pain.  Started 1st of the week.  It was less intense as though he slept on it wrong.  It was localized to the lateral shoulder and deltoid area.  It progressively started to get worse.  Now encompassing right shoulder and right side of neck.  No trauma.  Feels like a constant, harsh pain with jolts of pain.  Rates it a 8/10 on the pain scale.  Took some Tylenol, the first night it helped, but lately it is not helping at all.  Worsens with movement. Decreased range of motion of neck and shoulder.   BP 121/83  Pulse 76  Temp 98.3 F (36.8 C) (Oral)  Resp 18  Wt 351 lb (159.213 kg) Gen: NAD but uncomfortable HEENT: Trachea midline, supple. Neck: Posterior right sternocleidomastoid discomfort.  Decreased range of motion laterally to the left.  Shoulder: Right shoulder is tender to the touch anteriorly over the pectoralis major superiorly and tender to palpation of the right superior trapezius muscle. Muscle tightness of the right trapezius muscle as well.  Decreased range of motion with shoulder extension and abduction. No tenderness with internal and external rotation.  No rash appreciated. Skin: No rash noted. Warm and dry Neuro: No focal deficits.   Assessment: 1. Muscle strain 2. Multiple myeloma  Plan: Will get cervical neck X-ray.  Will get right shoulder X-ray.  Will prescribe Naproxen 500 mg BID x 10 days and Flexeril x 10 days.  He will call Monday if not feeling better.   All questions were answered.  He will call the clinic with any questions or concerns.   KEFALAS,[name]   Addendum: Cervical X-ray does not show any lytic lesions.  Mild disc degeneration with mild spurring at C3-4, C4-5, and C5-6.  Mild foraminal narrowing bilaterally at C4-5. Shoulder X-ray is pending.  KEFALAS,[name]

## 2012-03-04 NOTE — Patient Instructions (Addendum)
[facility]  Discharge Instructions  RECOMMENDATIONS MADE BY THE CONSULTANT AND ANY TEST RESULTS WILL BE SENT TO YOUR REFERRING DOCTOR.   EXAM FINDINGS BY MD TODAY AND SIGNS AND SYMPTOMS TO REPORT TO CLINIC OR PRIMARY MD: Exam per T. Kefalas PA.  MEDICATIONS PRESCRIBED: Meds escribed to your pharmacy Will give dose of hydrocodone here before you leave.  INSTRUCTIONS GIVEN AND DISCUSSED: Go by x ray on your way out  SPECIAL INSTRUCTIONS/FOLLOW-UP: Other (Referral/Appointments) Keep your scheduled appt.   I acknowledge that I have been informed and understand all the instructions given to me and received a copy. I do not have any more questions at this time, but understand that I may call the Specialty [facility] at [facility] at [phone] during business hours should I have any further questions or need assistance in obtaining follow-up care.    __________________________________________  _____________  __________ Signature of Patient or Authorized Representative            Date                   Time    __________________________________________ Nurse's Signature

## 2012-03-05 ENCOUNTER — Encounter: Payer: Self-pay | Admitting: Oncology

## 2012-03-08 ENCOUNTER — Ambulatory Visit (HOSPITAL_COMMUNITY)
Admission: RE | Admit: 2012-03-08 | Discharge: 2012-03-08 | Disposition: A | Discharge status: Home / Self Care | Payer: PRIVATE HEALTH INSURANCE | Source: Ambulatory Visit | Attending: Family Medicine | Admitting: Family Medicine

## 2012-03-08 ENCOUNTER — Other Ambulatory Visit (HOSPITAL_COMMUNITY): Payer: Self-pay | Admitting: Family Medicine

## 2012-03-08 DIAGNOSIS — R52 Pain, unspecified: Secondary | ICD-10-CM

## 2012-03-08 DIAGNOSIS — M79609 Pain in unspecified limb: Secondary | ICD-10-CM | POA: Insufficient documentation

## 2012-03-08 DIAGNOSIS — R609 Edema, unspecified: Secondary | ICD-10-CM

## 2012-03-08 DIAGNOSIS — M7989 Other specified soft tissue disorders: Secondary | ICD-10-CM | POA: Insufficient documentation

## 2012-03-11 ENCOUNTER — Encounter (HOSPITAL_BASED_OUTPATIENT_CLINIC_OR_DEPARTMENT_OTHER): Payer: PRIVATE HEALTH INSURANCE

## 2012-03-11 ENCOUNTER — Telehealth (HOSPITAL_COMMUNITY): Payer: Self-pay | Admitting: Oncology

## 2012-03-11 ENCOUNTER — Encounter (HOSPITAL_BASED_OUTPATIENT_CLINIC_OR_DEPARTMENT_OTHER): Payer: PRIVATE HEALTH INSURANCE | Admitting: Oncology

## 2012-03-11 ENCOUNTER — Other Ambulatory Visit (INDEPENDENT_AMBULATORY_CARE_PROVIDER_SITE_OTHER): Payer: Self-pay | Admitting: Internal Medicine

## 2012-03-11 ENCOUNTER — Encounter (HOSPITAL_COMMUNITY): Payer: PRIVATE HEALTH INSURANCE

## 2012-03-11 VITALS — BP 112/66 | HR 79 | Temp 97.9°F | Resp 20

## 2012-03-11 DIAGNOSIS — C9000 Multiple myeloma not having achieved remission: Secondary | ICD-10-CM

## 2012-03-11 DIAGNOSIS — L0390 Cellulitis, unspecified: Secondary | ICD-10-CM

## 2012-03-11 DIAGNOSIS — L0291 Cutaneous abscess, unspecified: Secondary | ICD-10-CM

## 2012-03-11 LAB — CBC WITH DIFFERENTIAL/PLATELET
Basophils Absolute: 0 10*3/uL (ref 0.0–0.1)
Basophils Relative: 0 % (ref 0–1)
Eosinophils Absolute: 0.2 10*3/uL (ref 0.0–0.7)
Eosinophils Relative: 2 % (ref 0–5)
HCT: 33.8 % — ABNORMAL LOW (ref 39.0–52.0)
Hemoglobin: 11.4 g/dL — ABNORMAL LOW (ref 13.0–17.0)
Lymphocytes Relative: 24 % (ref 12–46)
Lymphs Abs: 2.1 10*3/uL (ref 0.7–4.0)
MCH: 30.8 pg (ref 26.0–34.0)
MCHC: 33.7 g/dL (ref 30.0–36.0)
MCV: 91.4 fL (ref 78.0–100.0)
Monocytes Absolute: 0.6 10*3/uL (ref 0.1–1.0)
Monocytes Relative: 7 % (ref 3–12)
Neutro Abs: 5.8 10*3/uL (ref 1.7–7.7)
Neutrophils Relative %: 66 % (ref 43–77)
Platelets: 178 10*3/uL (ref 150–400)
RBC: 3.7 MIL/uL — ABNORMAL LOW (ref 4.22–5.81)
RDW: 13.6 % (ref 11.5–15.5)
WBC: 8.8 10*3/uL (ref 4.0–10.5)

## 2012-03-11 LAB — COMPREHENSIVE METABOLIC PANEL
ALT: 32 U/L (ref 0–53)
AST: 22 U/L (ref 0–37)
Albumin: 2.8 g/dL — ABNORMAL LOW (ref 3.5–5.2)
Alkaline Phosphatase: 84 U/L (ref 39–117)
BUN: 23 mg/dL (ref 6–23)
CO2: 20 mEq/L (ref 19–32)
Calcium: 9.2 mg/dL (ref 8.4–10.5)
Chloride: 106 mEq/L (ref 96–112)
Creatinine, Ser: 1.28 mg/dL (ref 0.50–1.35)
GFR calc Af Amer: 72 mL/min — ABNORMAL LOW (ref >90)
GFR calc non Af Amer: 62 mL/min — ABNORMAL LOW (ref >90)
Glucose, Bld: 148 mg/dL — ABNORMAL HIGH (ref 70–99)
Potassium: 3.3 mEq/L — ABNORMAL LOW (ref 3.5–5.1)
Sodium: 138 mEq/L (ref 135–145)
Total Bilirubin: 0.2 mg/dL — ABNORMAL LOW (ref 0.3–1.2)
Total Protein: 5.7 g/dL — ABNORMAL LOW (ref 6.0–8.3)

## 2012-03-11 MED ORDER — SODIUM CHLORIDE 0.9 % IJ SOLN
INTRAMUSCULAR | Status: AC
  Filled 2012-03-11: qty 10

## 2012-03-11 MED ORDER — CEPHALEXIN 500 MG PO CAPS: 500.0000 mg | ORAL_CAPSULE | Freq: Four times a day (QID) | ORAL | Status: DC

## 2012-03-11 MED ORDER — ZOLEDRONIC ACID 4 MG/5ML IV CONC
2.0000 mg | Freq: Once | INTRAVENOUS | Status: AC
  Administered 2012-03-11: 2 mg via INTRAVENOUS
  Filled 2012-03-11: qty 2.5

## 2012-03-11 MED ORDER — HEPARIN SOD (PORK) LOCK FLUSH 100 UNIT/ML IV SOLN
500.0000 [IU] | Freq: Once | INTRAVENOUS | Status: AC
  Administered 2012-03-11: 500 [IU] via INTRAVENOUS
  Filled 2012-03-11: qty 5

## 2012-03-11 MED ORDER — LENALIDOMIDE 10 MG PO CAPS: 10.0000 mg | ORAL_CAPSULE | Freq: Every day | ORAL | Status: DC

## 2012-03-11 MED ORDER — HEPARIN SOD (PORK) LOCK FLUSH 100 UNIT/ML IV SOLN
INTRAVENOUS | Status: AC
  Filled 2012-03-11: qty 5

## 2012-03-11 NOTE — Addendum Note (Signed)
Addended by: Gerhard Perches on: 03/11/2012 04:28 PM   Modules accepted: Orders

## 2012-03-11 NOTE — Progress Notes (Signed)
[name] presented for Portacath access and flush. Proper placement of portacath confirmed by CXR. Portacath located lt chest wall accessed with  H 20 needle. Good blood return present. Portacath flushed with 28ml NS and 500U/92ml Heparin and needle removed intact. Procedure without incident. Patient tolerated procedure well.  Pt tolerated Zometa infusion well.

## 2012-03-11 NOTE — Progress Notes (Signed)
Franklin Font, MD [place] [id number]  1. Cellulitis  cephALEXin (KEFLEX) 500 MG capsule  2. Multiple myeloma  CBC with Differential, Comprehensive metabolic panel, Lactate dehydrogenase, Multiple myeloma panel, serum, Kappa/lambda light chains, CBC with Differential, Comprehensive metabolic panel, Lactate dehydrogenase, Multiple myeloma panel, serum, Kappa/lambda light chains, lenalidomide ([name]) 10 MG capsule, DISCONTINUED: lenalidomide ([name]) 10 MG capsule    CURRENT THERAPY: To begin 10 mg [name] daily  INTERVAL HISTORY: [name] 51 y.o. male returns for  regular  visit for followup of  Stage II multiple myeloma with a beta 2 microglobulin level less than 3.5 and albumin of 3.4, and of course, he presented with Kappa light chain disease. Presently in remission.  [name] is here receiving a Zometa IV infusion.    He reports that he saw his PCP the other day for left LE edema.  He reports that he was diagnosed with a cellulitis and his PCP told him to keep his leg elevated and try to stay off of it without providing him an antibiotic.  Doppler US of L LE was negative for DVT.  In light of the patient's immunocompromised state, I will give him an antibiotic, namely Keflex for 14 days. See physical exam for more details.   He reports that his shoulder and neck pain which I saw him as a walk-in for in the near past is improving and is much better.  He is able to display full ROM of neck.   We will start him on maintenance [name] 10 mg daily.  This will take the place of his last Rx for 21 days on and 7 days off at 25 mg.   Otherwise, [name] is doing well.  Complete ROS questioning is otherwise negative.   Past Medical History  Diagnosis Date  . Arteriosclerotic cardiovascular disease (ASCVD)     MI-2000s; stent to the proximal LAD and diagonal in 2001; stress nuclear in 2008-impaired exercise capacity, left ventricular dilatation, moderately to  severely depressed EF, apical, inferior and anteroseptal scar  . Sleep apnea   . Hypertension   . 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 Multiple myeloma(203.0) 07/01/2011  . Diabetes mellitus     Insulin  . GERD (gastroesophageal reflux disease)   . Pedal edema     Venous insufficiency  . Obesity   . Hyperlipidemia   . Chronic kidney disease, stage 3, mod decreased GFR     Creatinine of 1.84 in 06/2011 and 1.5 in 07/2011  . Injection site reaction     has DIABETES MELLITUS, TYPE II, ON INSULIN; Morbid obesity; SLEEP APNEA; Multiple myeloma; Arteriosclerotic cardiovascular disease (ASCVD); Hypertension; Hyperlipidemia; Acute on chronic renal failure; Diarrhea; Orthostasis; Edema of both legs; Systolic dysfunction; and Dehydration on his problem list.      has no known allergies.  [name] had no medications administered during this visit.  Past Surgical History  Procedure Date  . Laparoscopic gastric banding 2006  . Wrist surgery     Left; removal of bone fragment  . Incision and drainage abscess anal   . Abscess drainage     Scrotal  . Bone marrow biopsy 05/13/11  . Portacath placement 07/07/2011    Procedure: INSERTION PORT-A-CATH;  Surgeon: Franklin Ran, MD;  Location: [facility];  Service: General;  Laterality: N/A;  . Colonoscopy 11/28/2011    Procedure: COLONOSCOPY;  Surgeon: Franklin Houston, MD;  Location: [facility];  Service: Endoscopy;  Laterality: N/A;  930  .  Esophagogastroduodenoscopy 01/02/2012    Procedure: ESOPHAGOGASTRODUODENOSCOPY (EGD);  Surgeon: Franklin Houston, MD;  Location: [facility];  Service: Endoscopy;  Laterality: N/A;  100  . Esophageal biopsy 01/02/2012    Procedure: BIOPSY;  Surgeon: Franklin Houston, MD;  Location: [facility];  Service: Endoscopy;  Laterality: N/A;    Denies any headaches, dizziness, double vision, fevers, chills, night sweats, nausea, vomiting, diarrhea, constipation, chest pain, heart palpitations, shortness of breath,  blood in stool, black tarry stool, urinary pain, urinary burning, urinary frequency, hematuria.   PHYSICAL EXAMINATION  ECOG PERFORMANCE STATUS: 1 - Symptomatic but completely ambulatory  Filed Vitals:   03/11/12 1200  BP: 112/66  Pulse: 79  Temp: 97.9 F (36.6 C)  Resp: 20    GENERAL:alert, no distress, well nourished, well developed, comfortable, cooperative, obese and smiling SKIN: skin color, texture, turgor are normal, no rashes or significant lesions HEAD: Normocephalic, No masses, lesions, tenderness or abnormalities EYES: normal, Conjunctiva are pink and non-injected EARS: External ears normal OROPHARYNX:lips, buccal mucosa, and tongue normal and mucous membranes are moist  NECK: supple, no adenopathy, thyroid normal size, non-tender, without nodularity, no stridor, non-tender, trachea midline LYMPH:  no palpable lymphadenopathy BREAST:not examined LUNGS: clear to auscultation  HEART: regular rate & rhythm, no murmurs, no gallops, S1 normal and S2 normal ABDOMEN:abdomen soft, non-tender, obese and normal bowel sounds BACK: Back symmetric, no curvature., No CVA tenderness EXTREMITIES:positive findings:  Left LE is edematous with 2+ pitting edema, erythematous superior to the popliteal region, and hot to the touch.  It is tender to palpation.  NEURO: alert & oriented x 3 with fluent speech, no focal motor/sensory deficits   LABORATORY DATA: CBC    Component Value Date/Time   WBC 8.8 03/11/2012 1149   RBC 3.70* 03/11/2012 1149   HGB 11.4* 03/11/2012 1149   HCT 33.8* 03/11/2012 1149   PLT 178 03/11/2012 1149   MCV 91.4 03/11/2012 1149   MCH 30.8 03/11/2012 1149   MCHC 33.7 03/11/2012 1149   RDW 13.6 03/11/2012 1149   LYMPHSABS 2.1 03/11/2012 1149   MONOABS 0.6 03/11/2012 1149   EOSABS 0.2 03/11/2012 1149   BASOSABS 0.0 03/11/2012 1149      Chemistry      Component Value Date/Time   NA 138 03/11/2012 1149   K 3.3* 03/11/2012 1149   CL 106 03/11/2012 1149    CO2 20 03/11/2012 1149   BUN 23 03/11/2012 1149   CREATININE 1.28 03/11/2012 1149   CREATININE 1.60* 10/20/2011 1758      Component Value Date/Time   CALCIUM 9.2 03/11/2012 1149   ALKPHOS 84 03/11/2012 1149   AST 22 03/11/2012 1149   ALT 32 03/11/2012 1149   BILITOT 0.2* 03/11/2012 1149       ASSESSMENT:  1. Stage II multiple myeloma with a beta 2 microglobulin level less than 3.5 and albumin of 3.4, and of course, he presented with Kappa light chain disease. Now in remission and multiple myeloma therapy on hold due to #3.  2. Morbid obesity, status post an attempt at gastric bypass surgery in the past.  3. Steatorrhea 4. Left LE cellulitis   PLAN:  1. I personally reviewed and went over laboratory results with the patient. 2. I personally reviewed and went over radiographic studies with the patient. 3. Continue with Zometa today and monthly.  4. Lab work today performed including MM panel which is pending.  5. Rx for Keflex 500 mg QID x 14 days.  6. Rx  for [name] 10 mg daily #30 with 1 refill.  7. Lab work monthly: CBC diff, CMET, MM panel, LDH 8. Return in 8 weeks for follow-up.    All questions were answered. The patient knows to call the clinic with any problems, questions or concerns. We can certainly see the patient much sooner if necessary.  Leavy Heatherly

## 2012-03-11 NOTE — Telephone Encounter (Signed)
[facility] [phone] [facility] to notify them that I be faxing over a new Script for revlimid 10mg . I was asked to call [employer] for a new auth number but they are closed from 10/10-10/11 for training. I will call on Monday to obtain new auth #

## 2012-03-12 ENCOUNTER — Ambulatory Visit (HOSPITAL_COMMUNITY): Payer: PRIVATE HEALTH INSURANCE

## 2012-03-12 ENCOUNTER — Other Ambulatory Visit (HOSPITAL_COMMUNITY): Payer: PRIVATE HEALTH INSURANCE

## 2012-03-12 ENCOUNTER — Ambulatory Visit (HOSPITAL_COMMUNITY): Payer: PRIVATE HEALTH INSURANCE | Admitting: Oncology

## 2012-03-15 ENCOUNTER — Other Ambulatory Visit (HOSPITAL_COMMUNITY): Payer: Self-pay | Admitting: Oncology

## 2012-03-15 DIAGNOSIS — C9000 Multiple myeloma not having achieved remission: Secondary | ICD-10-CM

## 2012-03-15 MED ORDER — SULFAMETHOXAZOLE-TRIMETHOPRIM 800-160 MG PO TABS: ORAL_TABLET | ORAL | Status: DC

## 2012-03-16 LAB — MULTIPLE MYELOMA PANEL, SERUM
Albumin ELP: 54.7 % — ABNORMAL LOW (ref 55.8–66.1)
Alpha-1-Globulin: 9.1 % — ABNORMAL HIGH (ref 2.9–4.9)
Alpha-2-Globulin: 16.5 % — ABNORMAL HIGH (ref 7.1–11.8)
Beta 2: 6 % (ref 3.2–6.5)
Beta Globulin: 6.2 % (ref 4.7–7.2)
Gamma Globulin: 7.5 % — ABNORMAL LOW (ref 11.1–18.8)
IgA: 55 mg/dL — ABNORMAL LOW (ref 68–379)
IgG (Immunoglobin G), Serum: 392 mg/dL — ABNORMAL LOW (ref 650–1600)
IgM, Serum: 75 mg/dL (ref 41–251)
M-Spike, %: NOT DETECTED g/dL
Total Protein: 5.2 g/dL — ABNORMAL LOW (ref 6.0–8.3)

## 2012-03-16 LAB — TRYPSINOGEN, BLOOD: Trypsinogen: 84 ng/mL — ABNORMAL HIGH (ref 19–68)

## 2012-03-16 LAB — KAPPA/LAMBDA LIGHT CHAINS
Kappa free light chain: 1.63 mg/dL (ref 0.33–1.94)
Kappa, lambda light chain ratio: 1.15 (ref 0.26–1.65)
Lambda free light chains: 1.42 mg/dL (ref 0.57–2.63)

## 2012-03-17 ENCOUNTER — Other Ambulatory Visit (HOSPITAL_COMMUNITY): Payer: Self-pay | Admitting: Oncology

## 2012-03-17 ENCOUNTER — Telehealth (HOSPITAL_COMMUNITY): Payer: Self-pay | Admitting: *Deleted

## 2012-03-17 DIAGNOSIS — R600 Localized edema: Secondary | ICD-10-CM

## 2012-03-17 MED ORDER — HYDROCODONE-ACETAMINOPHEN 5-325 MG PO TABS: 1.0000 | ORAL_TABLET | Freq: Four times a day (QID) | ORAL | Status: AC | PRN

## 2012-03-18 ENCOUNTER — Encounter: Payer: Self-pay | Admitting: Orthopedic Surgery

## 2012-03-18 ENCOUNTER — Ambulatory Visit (INDEPENDENT_AMBULATORY_CARE_PROVIDER_SITE_OTHER): Payer: PRIVATE HEALTH INSURANCE | Admitting: Orthopedic Surgery

## 2012-03-18 ENCOUNTER — Encounter (INDEPENDENT_AMBULATORY_CARE_PROVIDER_SITE_OTHER): Payer: Self-pay

## 2012-03-18 VITALS — BP 110/62 | Ht 75.0 in | Wt 351.0 lb

## 2012-03-18 DIAGNOSIS — M5030 Other cervical disc degeneration, unspecified cervical region: Secondary | ICD-10-CM

## 2012-03-18 DIAGNOSIS — T148XXA Other injury of unspecified body region, initial encounter: Secondary | ICD-10-CM

## 2012-03-18 DIAGNOSIS — M19019 Primary osteoarthritis, unspecified shoulder: Secondary | ICD-10-CM

## 2012-03-18 MED ORDER — CYCLOBENZAPRINE HCL 10 MG PO TABS: 10.0000 mg | ORAL_TABLET | Freq: Three times a day (TID) | ORAL | Status: DC | PRN

## 2012-03-18 MED ORDER — NAPROXEN 500 MG PO TABS: 500.0000 mg | ORAL_TABLET | Freq: Two times a day (BID) | ORAL | Status: DC

## 2012-03-18 NOTE — Progress Notes (Signed)
Patient ID: [name], male   DOB: [birth year], 51 y.o.   MRN: [id number] Chief Complaint  Patient presents with  . Shoulder Pain    right shoulder pain x two weeks, sudden onset, no known injury   Pain RIGHT shoulder x2 weeks.  The patient has been seen by his other doctor and has been diagnosed with myeloma. He is currently undergoing treatment. His mother also had this disease and passed from it. He is on a recent chemotherapy cycle.  He is having difficulty when using the computer. He is having some stiffness when he is driving. In terms of turning his neck.  The last 10, days. He's been on hydrocodone, naproxen, and cyclobenzaprine for throbbing, 7/10. Intermittent pain in his RIGHT shoulder and cervical spine radiating across the shoulder along the medial scapula and into the RIGHT deltoid. He denies any locking. He does have some weakness. He does have underlying glenohumeral arthritis.  He says a recent diarrhea from the chemotherapy. He has some numbness in the RIGHT long finger and ring finger, which is occasional.  Past Medical History  Diagnosis Date  . Arteriosclerotic cardiovascular disease (ASCVD)     MI-2000s; stent to the proximal LAD and diagonal in 2001; stress nuclear in 2008-impaired exercise capacity, left ventricular dilatation, moderately to severely depressed EF, apical, inferior and anteroseptal scar  . Sleep apnea   . Hypertension   . 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 Multiple myeloma(203.0) 07/01/2011  . Diabetes mellitus     Insulin  . GERD (gastroesophageal reflux disease)   . Pedal edema     Venous insufficiency  . Obesity   . Hyperlipidemia   . Chronic kidney disease, stage 3, mod decreased GFR     Creatinine of 1.84 in 06/2011 and 1.5 in 07/2011  . Injection site reaction     BP 110/62  Ht 6\' 3"  (1.905 m)  Wt 351 lb (159.213 kg)  BMI 43.87 kg/m2 This is a well-developed, well-nourished, male, grooming, and hygiene, are intact. He is  oriented x3. His mood and affect are normal. His gait is not effective.  He has pain and tenderness with stiffness in his cervical spine with painful extension, relieved by flexion of the cervical spine and tenderness in the RIGHT trapezius muscle. No instability noted. Muscle tone increased with tightness. Skin is intact.  RIGHT shoulder decreased external rotation is noted compared to the LEFT from the glenohumeral arthritis. We also note full passive range of motion with no pain. Negative impingement sign mild weakness in the rotator cuff. Stability normal. Skin intact.  Distal pulses are normal. No lymphadenopathy is noted in the cervical spine. Sensation remains intact except for the tip of the long finger and ring finger.  X-rays were done at the hospital on October 3. He has glenohumeral arthritis appears stable.  He also has cervical disc disease with degeneration.  Recommend subacromial injection.  Continue hydrocodone, naproxen, and cyclobenzaprine at physical therapy.  Followup in 6 weeks.

## 2012-03-18 NOTE — Patient Instructions (Addendum)
Call hospital to arrange PT   Diagnosis  Disc disease cervical spine  Osteoarthritis right shoulder   Degenerative Disk Disease Degenerative disk disease is a condition caused by the changes that occur in the cushions of the backbone (spinal disks) as you grow older. Spinal disks are soft and compressible disks located between the bones of the spine (vertebrae). They act like shock absorbers. Degenerative disk disease can affect the whole spine. However, the neck and lower back are most commonly affected. Many changes can occur in the spinal disks with aging, such as:  The spinal disks may dry and shrink.   Small tears may occur in the tough, outer covering of the disk (annulus).   The disk space may become smaller due to loss of water.   Abnormal growths in the bone (spurs) may occur. This can put pressure on the nerve roots exiting the spinal canal, causing pain.   The spinal canal may become narrowed.  CAUSES   Degenerative disk disease is a condition caused by the changes that occur in the spinal disks with aging. The exact cause is not known, but there is a genetic basis for many patients. Degenerative changes can occur due to loss of fluid in the disk. This makes the disk thinner and reduces the space between the backbones. Small cracks can develop in the outer layer of the disk. This can lead to the breakdown of the disk. You are more likely to get degenerative disk disease if you are overweight. Smoking cigarettes and doing heavy work such as weightlifting can also increase your risk of this condition. Degenerative changes can start after a sudden injury. Growth of bone spurs can compress the nerve roots and cause pain.   SYMPTOMS   The symptoms vary from person to person. Some people may have no pain, while others have severe pain. The pain may be so severe that it can limit your activities. The location of the pain depends on the part of your backbone that is affected. You will have  neck or arm pain if a disk in the neck area is affected. You will have pain in your back, buttocks, or legs if a disk in the lower back is affected. The pain becomes worse while bending, reaching up, or with twisting movements. The pain may start gradually and then get worse as time passes. It may also start after a major or minor injury. You may feel numbness or tingling in the arms or legs.   DIAGNOSIS   Your caregiver will ask you about your symptoms and about activities or habits that may cause the pain. He or she may also ask about any injuries, diseases, or treatments you have had earlier. Your caregiver will examine you to check for the range of movement that is possible in the affected area, to check for strength in your extremities, and to check for sensation in the areas of the arms and legs supplied by different nerve roots. An X-ray of the spine may be taken. Your caregiver may suggest other imaging tests, such as magnetic resonance imaging (MRI), if needed.   TREATMENT   Treatment includes rest, modifying your activities, and applying ice and heat. Your caregiver may prescribe medicines to reduce your pain and may ask you to do some exercises to strengthen your back. In some cases, you may need surgery. You and your caregiver will decide on the treatment that is best for you. HOME CARE INSTRUCTIONS    Follow proper lifting and  walking techniques as advised by your caregiver.   Maintain good posture.   Exercise regularly as advised.   Perform relaxation exercises.   Change your sitting, standing, and sleeping habits as advised. Change positions frequently.   Lose weight as advised.   Stop smoking if you smoke.   Wear supportive footwear.  SEEK MEDICAL CARE IF:   Your pain does not go away within 1 to 4 weeks. SEEK IMMEDIATE MEDICAL CARE IF:    Your pain is severe.   You notice weakness in your arms, hands, or legs.   You begin to lose control of your bladder or bowel  movements.  MAKE SURE YOU:    Understand these instructions.   Will watch your condition.   Will get help right away if you are not doing well or get worse.  Document Released: 03/16/2007 Document Revised: 08/11/2011 Document Reviewed: 03/16/2007 [facility] Patient Information 2013 [place].   Cervical Radiculopathy Cervical radiculopathy means a nerve in the neck is pinched or bruised. This can cause pain or loss of feeling (numbness) that runs from your neck to your arm and fingers. HOME CARE    Put ice on the injured or painful area.   Put ice in a plastic bag.   Place a towel between your skin and the bag.   Leave the ice on for 15 to 20 minutes, 3 to 4 times a day, or as told by your doctor.   If ice does not help, you can try using heat. Take a warm shower or bath, or use a hot water bottle as told by your doctor.   You may try a gentle neck and shoulder massage.   Use a flat pillow when you sleep.   Only take medicines as told by your doctor.   Keep all physical therapy visits as told by your doctor.   If you are given a soft collar, wear it as told by your doctor.  GET HELP RIGHT AWAY IF:    Your pain gets worse and is not controlled with medicine.   You lose feeling or feel weak in your hand, arm, face, or leg.   You have a fever or stiff neck.   You cannot control when you poop or pee (incontinence).   You have trouble with walking, balance, or speaking.  MAKE SURE YOU:    Understand these instructions.   Will watch your condition.   Will get help right away if you are not doing well or get worse.  Document Released: 05/08/2011 Document Revised: 08/11/2011 Document Reviewed: 05/08/2011 [facility] Patient Information 2013 [place].

## 2012-03-22 ENCOUNTER — Encounter (INDEPENDENT_AMBULATORY_CARE_PROVIDER_SITE_OTHER): Payer: Self-pay | Admitting: Internal Medicine

## 2012-03-22 ENCOUNTER — Ambulatory Visit (INDEPENDENT_AMBULATORY_CARE_PROVIDER_SITE_OTHER): Payer: PRIVATE HEALTH INSURANCE | Admitting: Internal Medicine

## 2012-03-22 ENCOUNTER — Other Ambulatory Visit (HOSPITAL_COMMUNITY): Payer: Self-pay | Admitting: Oncology

## 2012-03-22 VITALS — BP 110/74 | HR 72 | Temp 97.4°F | Resp 20 | Ht 75.0 in | Wt 359.5 lb

## 2012-03-22 DIAGNOSIS — K903 Pancreatic steatorrhea: Secondary | ICD-10-CM

## 2012-03-22 MED ORDER — PANCRELIPASE (LIP-PROT-AMYL) 36000-114000 UNITS PO CPEP: 72000.0000 [IU] | ORAL_CAPSULE | Freq: Two times a day (BID) | ORAL | Status: DC

## 2012-03-22 NOTE — Progress Notes (Signed)
Presenting complaint;  Followup for steatorrhea.  Subjective:  Patient is 51 year old American male who is here for re-evaluation of steatorrhea. Workup was negative for celiac disease and he did not respond to empiric antibiotic therapy. He was spilling excessive amount of fat in his stool. He was felt to have pancreatic insufficiency and begun on pancreatic enzyme supplement.  He has gained 5 pounds since his visit one month ago. On most days he has 1-2 soft to hard stools. Dr. [name] discontinued Victoza he noted significant reduction in epigastric and right-sided abdominal pain. He still has intermittent flatulence. Over the last weekend he had explosive diarrhea what he calls flushed out and he believes it was because he ate fried greens and ice cream. He is not experiencing any side effects the pancreatic enzyme supplement.  Current Medications: Current Outpatient Prescriptions  Medication Sig Dispense Refill  . acyclovir (ZOVIRAX) 400 MG tablet Take 1 tablet (400 mg total) by mouth every morning. To start Jul 07, 2011 (to take daily while on chemo)  30 tablet  2  . allopurinol (ZYLOPRIM) 300 MG tablet Take 300 mg by mouth daily.      Marland Kitchen aspirin EC 325 MG tablet Take 325 mg by mouth every morning.       . carvedilol (COREG) 3.125 MG tablet Take 1 tablet (3.125 mg total) by mouth 2 (two) times daily.  60 tablet  6  . cephALEXin (KEFLEX) 500 MG capsule Take 1 capsule (500 mg total) by mouth 4 (four) times daily.  56 capsule  0  . Cholecalciferol (VITAMIN D) 2000 UNITS CAPS Take 1 capsule by mouth every morning.       . cyclobenzaprine (FLEXERIL) 10 MG tablet Take 1 tablet (10 mg total) by mouth 3 (three) times daily as needed for muscle spasms.  30 tablet  2  . dexamethasone (DECADRON) 4 MG tablet Take 5 tablets (20 mg total) by mouth 2 (two) times daily. Every Friday  40 tablet  2  . dexlansoprazole (DEXILANT) 60 MG capsule Take 60 mg by mouth every morning.       . diphenoxylate-atropine  (LOMOTIL) 2.5-0.025 MG per tablet Take two tablets PO after each loose stool.  Max 8 per day  50 tablet  1  . HYDROcodone-acetaminophen (NORCO/VICODIN) 5-325 MG per tablet Take 1 tablet by mouth every 6 (six) hours as needed for pain.  30 tablet  0  . insulin aspart (NOVOLOG) 100 UNIT/ML injection Inject 50 Units into the skin as needed. Sliding scale      . insulin glargine (LANTUS) 100 UNIT/ML injection Inject 60 Units into the skin daily before breakfast.      . L-Methylfolate-B6-B12 (METANX PO) Take 1 tablet by mouth every morning.       Marland Kitchen lenalidomide ([name]) 10 MG capsule Take 1 capsule (10 mg total) by mouth daily.  30 capsule  1  . lidocaine-prilocaine (EMLA) cream Apply 1 application topically as needed. To port site. Apply 1 hour prior to treatment. Do not rub in. Cover with plastic.      Marland Kitchen loratadine (CLARITIN) 10 MG tablet Take 10 mg by mouth every morning.       . Multiple Vitamins-Minerals (MULTIVITAMINS THER. W/MINERALS) TABS Take 1 tablet by mouth every morning.        . naproxen (NAPROSYN) 500 MG tablet Take 1 tablet (500 mg total) by mouth 2 (two) times daily with a meal.  60 tablet  2  . NON FORMULARY Herbal life shake daily      .  ondansetron (ZOFRAN) 8 MG tablet Take 8 mg by mouth 2 (two) times daily as needed. Nausea/vomiting. Patient to also take this 30 minutes to 1 hour prior to Velcade injections.      . Pancrelipase, Lip-Prot-Amyl, 24000 UNITS CPEP Take 2 capsules by mouth 3 (three) times daily with meals. Take 2 capsules with each meal and 1 capsule with each snack      . potassium chloride (K-DUR,KLOR-CON) 10 MEQ tablet Take 1 tablet (10 mEq total) by mouth 2 (two) times daily.  60 tablet  2  . rosuvastatin (CRESTOR) 10 MG tablet Take 10 mg by mouth every morning.       . sulfamethoxazole-trimethoprim (BACTRIM DS,SEPTRA DS) 800-160 MG per tablet Take 1 tablet, PO, Monday-Wednesday-Friday  12 tablet  2  . Tadalafil (CIALIS PO) Take by mouth as needed.        .  torsemide (DEMADEX) 20 MG tablet Take 40 mg by mouth daily.      . Zoledronic Acid (ZOMETA IV) Inject 2 mg into the vein every 28 (twenty-eight) days. To start Jul 11, 2011         Objective: Blood pressure 110/74, pulse 72, temperature 97.4 F (36.3 C), temperature source Oral, resp. rate 20, height 6\' 3"  (1.905 m), weight 359 lb 8 oz (163.068 kg). Patient is alert and in no acute distress. Conjunctiva is pink. Sclera is nonicteric Oropharyngeal mucosa is normal. No neck masses or thyromegaly noted. Cardiac exam with regular rhythm normal S1 and S2. No murmur or gallop noted. Lungs are clear to auscultation. Abdomen is obese. Bowel sounds are normal. Abdomen is soft with tympanitic percussion note in epigastric region. No organomegaly or masses. No clubbing or LE edema noted.   Labs/studies Results: Trypsinogen level is 84ng/ml(19-68).  Assessment:  Steatorrhea felt to be secondary to idiopathic pancreatic insufficiency. He had 91 grams of fat for 24 hours on stool collection. This degree of steatorrhea would be unusual for other diseases. Serum trypsinogen is on the high side and it may be because he is being treated. Will not pursue with further workup unless he has  relapse of his diarrhea. Upper abdominal pain has decreased since Victoza was stopped by Dr. [name]. If pain recurs will consider HIDA scan.   Plan:  Continue Creon 36K two capsules with each meal and one with snack. Samples given to the patient. Patient advised to keep symptom diary on days he can to be reviewed on his next visit. Office visit in 3 months.

## 2012-03-22 NOTE — Progress Notes (Signed)
This encounter was created in error - please disregard.

## 2012-03-24 ENCOUNTER — Encounter: Payer: Self-pay | Admitting: *Deleted

## 2012-03-24 ENCOUNTER — Other Ambulatory Visit (HOSPITAL_COMMUNITY): Payer: Self-pay | Admitting: Oncology

## 2012-03-24 DIAGNOSIS — E876 Hypokalemia: Secondary | ICD-10-CM

## 2012-03-24 MED ORDER — POTASSIUM CHLORIDE CRYS ER 20 MEQ PO TBCR: EXTENDED_RELEASE_TABLET | ORAL | Status: DC

## 2012-03-29 ENCOUNTER — Ambulatory Visit (HOSPITAL_COMMUNITY)
Admission: RE | Admit: 2012-03-29 | Discharge: 2012-03-29 | Disposition: A | Discharge status: Home / Self Care | Payer: PRIVATE HEALTH INSURANCE | Source: Ambulatory Visit | Attending: Orthopedic Surgery | Admitting: Orthopedic Surgery

## 2012-03-29 DIAGNOSIS — M6281 Muscle weakness (generalized): Secondary | ICD-10-CM | POA: Insufficient documentation

## 2012-03-29 DIAGNOSIS — IMO0001 Reserved for inherently not codable concepts without codable children: Secondary | ICD-10-CM | POA: Insufficient documentation

## 2012-03-29 DIAGNOSIS — M19019 Primary osteoarthritis, unspecified shoulder: Secondary | ICD-10-CM

## 2012-03-29 DIAGNOSIS — I10 Essential (primary) hypertension: Secondary | ICD-10-CM | POA: Insufficient documentation

## 2012-03-29 DIAGNOSIS — M5030 Other cervical disc degeneration, unspecified cervical region: Secondary | ICD-10-CM

## 2012-03-29 DIAGNOSIS — M542 Cervicalgia: Secondary | ICD-10-CM | POA: Insufficient documentation

## 2012-03-29 DIAGNOSIS — M25519 Pain in unspecified shoulder: Secondary | ICD-10-CM | POA: Insufficient documentation

## 2012-03-29 NOTE — Evaluation (Signed)
Occupational Therapy Evaluation  Patient Details  Name: [name] MRN: [id number] Date of Birth: July 05, 1960  Today's Date: 03/29/2012 Time: [id number] OT Time Calculation (min): 37 min OT Evaluation [id number] Manual Therapy [id number] 10' Visit#: 1  of 12   Re-eval: 04/26/12  Assessment Diagnosis: Cervical DDD and Right Shoulder Arthritis Next MD Visit: 6 weeks Prior Therapy: N/A  Authorization: n/a   Past Medical History:  Past Medical History  Diagnosis Date  . Arteriosclerotic cardiovascular disease (ASCVD)     MI-2000s; stent to the proximal LAD and diagonal in 2001; stress nuclear in 2008-impaired exercise capacity, left ventricular dilatation, moderately to severely depressed EF, apical, inferior and anteroseptal scar  . Sleep apnea   . Hypertension   . 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 Multiple myeloma(203.0) 07/01/2011  . Diabetes mellitus     Insulin  . GERD (gastroesophageal reflux disease)   . Pedal edema     Venous insufficiency  . Obesity   . Hyperlipidemia   . Chronic kidney disease, stage 3, mod decreased GFR     Creatinine of 1.84 in 06/2011 and 1.5 in 07/2011  . Injection site reaction    Past Surgical History:  Past Surgical History  Procedure Date  . Laparoscopic gastric banding 2006  . Wrist surgery     Left; removal of bone fragment  . Incision and drainage abscess anal   . Abscess drainage     Scrotal  . Bone marrow biopsy 05/13/11  . Portacath placement 07/07/2011    Procedure: INSERTION PORT-A-CATH;  Surgeon: Scherry Ran, MD;  Location: [facility];  Service: General;  Laterality: N/A;  . Colonoscopy 11/28/2011    Procedure: COLONOSCOPY;  Surgeon: Rogene Houston, MD;  Location: [facility];  Service: Endoscopy;  Laterality: N/A;  930  . Esophagogastroduodenoscopy 01/02/2012    Procedure: ESOPHAGOGASTRODUODENOSCOPY (EGD);  Surgeon: Rogene Houston, MD;  Location: [facility];  Service: Endoscopy;  Laterality: N/A;  100  .  Esophageal biopsy 01/02/2012    Procedure: BIOPSY;  Surgeon: Rogene Houston, MD;  Location: [facility];  Service: Endoscopy;  Laterality: N/A;    Subjective S:  I started having shoulder and neck pain about a month ago.   Pertinent History: Mr. Keefover began experiencing right shoulder pain and neck pain with limited mobility approximately one month ago. He consulted with Dr. Aline Brochure and had a xray that detected arthritis in his right shoulder and neck.  He had a cortisone injection, which has not alleviated any of his pain.  He has been referred to ocuppational  therapy for evaluation and treatment.   Special Tests: UEFI:  65% Patient Stated Goals: I want to be pain free. 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 Huneke is having difficulty and increased pain in his neck and right shoulder when he uses his computer at work, when driving, reaching overhead, and when attempting to get comfortable to go to sleep at night. Dominant Hand: Right Vision - History Baseline Vision: No visual deficits  Cognition/Observation Cognition Overall Cognitive Status: Appears within functional  limits for tasks assessed  Sensation/Coordination/Edema Sensation Light Touch: Appears Intact Coordination Gross Motor Movements are Fluid and Coordinated: Yes Fine Motor Movements are Fluid and Coordinated: Yes  Additional Assessments RUE AROM  (degrees) RUE Overall AROM Comments: assessed in seated Right Shoulder Flexion: 96 Degrees Right Shoulder ABduction: 94 Degrees Right Shoulder Internal Rotation: 72 Degrees Right Shoulder External Rotation: 25 Degrees RUE PROM (degrees) RUE Overall PROM Comments: WFL in supine RUE Strength RUE Overall Strength Comments: assessed in seated Right Shoulder Flexion:  (4-/5) Right Shoulder ABduction: 4/5 Right Shoulder Internal Rotation: 4/5 Right Shoulder External Rotation: 4/5 Cervical AROM Overall Cervical AROM Comments: assessed in seated Cervical Flexion: WFL Cervical Extension: 14.0 cm Cervical - Right Side Bend: 16.0 cm Cervical - Left Side Bend: 18.0cm Cervical - Right Rotation: 11.5 cm Cervical - Left Rotation: 20.0 cm Palpation Palpation: moderate fascial restrictions in his scapular, trapezius, SCM, and upper arm     Exercise/Treatments    Manual Therapy Manual Therapy: Myofascial release Myofascial Release: MFR and manual stretching to right scapular, upper arm, trapezius, SCM to decrease pain and restrictions and increase pain free mobility in his neck and right arm.  [id number]   Occupational Therapy Assessment and Plan OT Assessment and Plan Clinical Impression Statement: A:  Patient is a 51 year old male with arthritis in his neck and right shoulder which is causing increased pain and decreased mobility and strength in his right shoulder and neck.  He has decreased I with functional activities as a result of these deficits.   Pt will benefit from skilled therapeutic intervention in order to improve on the following deficits: Decreased range of motion;Decreased strength;Increased muscle spasms;Increased fascial restricitons;Pain;Impaired UE functional use Rehab Potential: Good OT Frequency: Min 2X/week OT Duration: 6 weeks OT Treatment/Interventions: Self-care/ADL training;Therapeutic exercise;Therapeutic activities;Manual therapy;Modalities;Patient/family  education OT Plan: P: Skilled OT intervention to decrease pain and restrictions and increase mobility and strength in his right shoulder and neck in order to return to prior level of function.  Treatment Plan:  MFR and manual massage and stretching to right scapular, trapezius, SCM, and upper arm. Gentle manual cervical traction and right arm traction.  Therapeutic Exercises:  PROM and AAROM in supine.  seated scapular stability exercises and neck AROM/stretches.  ball stretches.  pulleys.  progress as toleated.     Goals Short Term Goals Time to Complete Short Term Goals: 3 weeks Short Term Goal 1: Patient will be educated on a HEP. Short Term Goal 2: Patient will increase AROM in his right shoulder and neck by 50% for increased comfort and safety when driving. Short Term Goal 3: Patient will increase right shoulder strength to 4+/5 for increased ability to lift files at work. Short Term Goal 4: Patient will decrease pain in his neck and right shoulder to 4/10 when going to sleep. Short Term Goal 5: Patient will decrease fascial restrictions to min-mod in his neck and right shoulder region. Long Term Goals Time to Complete Long Term Goals: 6 weeks Long Term Goal 1: Patient will return to prior level of I with all B/IADLs, work, and leisure activities.   Long Term Goal 2: Patient will increase his right shoulder and neck AROM to WNL for increased ability to use his computer without pain. Long Term Goal 3: Patient will increase right shoulder strength to 5/5 for increased ability to lift files at work. Long Term Goal 4: Patient will decrease pain in his right neck and shoulder region to minimal. Long Term Goal 5: Patient  will decrease fascial restrictions to minimal in his right shoulder and neck region.  Problem List Patient Active Problem List  Diagnosis  . DIABETES MELLITUS, TYPE II, ON INSULIN  . Morbid obesity  . SLEEP APNEA  . Multiple myeloma  . Arteriosclerotic cardiovascular disease  (ASCVD)  . Hypertension  . Hyperlipidemia  . Acute on chronic renal failure  . Diarrhea  . Orthostasis  . Edema of both legs  . Systolic dysfunction  . Dehydration  . Arthritis, shoulder region  . DDD (degenerative disc disease), cervical  . Pain in joint, shoulder region  . Muscle weakness (generalized)    End of Session Activity Tolerance: Patient tolerated treatment well General Behavior During Session: [facility] for tasks performed Cognition: [facility] for tasks performed OT Plan of Care OT Home Exercise Plan: towel slides, cervical AROM, and cervical stretches issued this date.     Vangie Bicker, OTR/L  03/29/2012, 1:03 PM  Physician Documentation Your signature is required to indicate approval of the treatment plan as stated above.  Please sign and either send electronically or make a copy of this report for your files and return this physician signed original.  Please mark one 1.__approve of plan  2. ___approve of plan with the following conditions.   ______________________________                                                          _____________________ Physician Signature                                                                                                             Date

## 2012-03-31 ENCOUNTER — Ambulatory Visit (HOSPITAL_COMMUNITY)
Admission: RE | Admit: 2012-03-31 | Discharge: 2012-03-31 | Disposition: A | Discharge status: Home / Self Care | Payer: PRIVATE HEALTH INSURANCE | Source: Ambulatory Visit | Attending: Orthopedic Surgery | Admitting: Orthopedic Surgery

## 2012-03-31 NOTE — Progress Notes (Signed)
Occupational Therapy Treatment Patient Details  Name: [name] MRN: [id number] Date of Birth: Feb 02, 1961  Today's Date: 03/31/2012 Time: 0933-1026 OT Time Calculation (min): 53 min Manual Therapy [phone] 29' Therapeutic Exercise 1003-1026 23'  Visit#: 2  of 12   Re-eval: 04/26/12    Authorization: n/a  Authorization Time Period:    Authorization Visit#:   of    Subjective Symptoms/Limitations Symptoms: S: I am sore but not in any pain Pain Assessment Currently in Pain?: No/denies Pain Score: 0-No pain  Precautions/Restrictions  Precautions Precautions: None  Exercise/Treatments Supine Protraction: PROM;AAROM;10 reps Horizontal ABduction: PROM;AAROM;10 reps External Rotation: PROM;AAROM;10 reps Internal Rotation: PROM;AAROM;10 reps Flexion: PROM;AAROM;10 reps ABduction: PROM;AAROM;10 reps Seated Elevation: AROM;10 reps Extension: AROM;10 reps Row: AROM;10 reps Other Seated Exercises: cervical AROM flexion, ext, rotate L/R and lateral bend L/R Therapy Ball Flexion: 15 reps ABduction: 15 reps      Manual Therapy Manual Therapy: Myofascial release Myofascial Release: MFR and manual stretching to right shoulder, upper arm, pectoralis, scapular border, and upper trap as well as gentle cervical traction and release along SCM with goal to decrease restrictions and increase pain free A/PROM  Occupational Therapy Assessment and Plan OT Assessment and Plan Clinical Impression Statement: A:  Added multiple new exercises which patient tolerated well.  Patient stated that he could feel a stretch with cervical AROM and with ball stretches. OT Plan: P;  Add pulleys and increase reps with AAROM   Goals Short Term Goals Time to Complete Short Term Goals: 3 weeks Short Term Goal 1: Patient will be educated on a HEP. Short Term Goal 2: Patient will increase AROM in his right shoulder and neck by 50% for increased comfort and safety when driving. Short Term Goal 3:  Patient will increase right shoulder strength to 4+/5 for increased ability to lift files at work. Short Term Goal 4: Patient will decrease pain in his neck and right shoulder to 4/10 when going to sleep. Short Term Goal 5: Patient will decrease fascial restrictions to min-mod in his neck and right shoulder region. Long Term Goals Time to Complete Long Term Goals: 6 weeks Long Term Goal 1: Patient will return to prior level of I with all B/IADLs, work, and leisure activities.   Long Term Goal 2: Patient will increase his right shoulder and neck AROM to WNL for increased ability to use his computer without pain. Long Term Goal 3: Patient will increase right shoulder strength to 5/5 for increased ability to lift files at work. Long Term Goal 4: Patient will decrease pain in his right neck and shoulder region to minimal. Long Term Goal 5: Patient will decrease fascial restrictions to minimal in his right shoulder and neck region.  Problem List Patient Active Problem List  Diagnosis  . DIABETES MELLITUS, TYPE II, ON INSULIN  . Morbid obesity  . SLEEP APNEA  . Multiple myeloma  . Arteriosclerotic cardiovascular disease (ASCVD)  . Hypertension  . Hyperlipidemia  . Acute on chronic renal failure  . Diarrhea  . Orthostasis  . Edema of both legs  . Systolic dysfunction  . Dehydration  . Arthritis, shoulder region  . DDD (degenerative disc disease), cervical  . Pain in joint, shoulder region  . Muscle weakness (generalized)    End of Session Activity Tolerance: Patient tolerated treatment well General Behavior During Session: [facility] for tasks performed Cognition: [facility] for tasks performed  GO   [name], COTA/L  03/31/2012, 4:22 PM

## 2012-04-06 ENCOUNTER — Other Ambulatory Visit (HOSPITAL_COMMUNITY): Payer: Self-pay | Admitting: Oncology

## 2012-04-06 ENCOUNTER — Ambulatory Visit (HOSPITAL_COMMUNITY)
Admission: RE | Admit: 2012-04-06 | Discharge: 2012-04-06 | Disposition: A | Discharge status: Home / Self Care | Payer: PRIVATE HEALTH INSURANCE | Source: Ambulatory Visit | Attending: Orthopedic Surgery | Admitting: Orthopedic Surgery

## 2012-04-06 DIAGNOSIS — M25519 Pain in unspecified shoulder: Secondary | ICD-10-CM | POA: Insufficient documentation

## 2012-04-06 DIAGNOSIS — IMO0001 Reserved for inherently not codable concepts without codable children: Secondary | ICD-10-CM | POA: Insufficient documentation

## 2012-04-06 DIAGNOSIS — I10 Essential (primary) hypertension: Secondary | ICD-10-CM | POA: Insufficient documentation

## 2012-04-06 DIAGNOSIS — M542 Cervicalgia: Secondary | ICD-10-CM | POA: Insufficient documentation

## 2012-04-06 DIAGNOSIS — R197 Diarrhea, unspecified: Secondary | ICD-10-CM

## 2012-04-06 DIAGNOSIS — M6281 Muscle weakness (generalized): Secondary | ICD-10-CM | POA: Insufficient documentation

## 2012-04-06 MED ORDER — DIPHENOXYLATE-ATROPINE 2.5-0.025 MG PO TABS: ORAL_TABLET | ORAL | Status: DC

## 2012-04-06 NOTE — Progress Notes (Signed)
Occupational Therapy Treatment Patient Details  Name: [name] MRN: [id number] Date of Birth: [birth year]  Today's Date: 04/06/2012 Time: [id number] OT Time Calculation (min): 52 min Manual Therapy [id number] 26' Therapeutic Exercise [phone] 25'  Visit#: 3  of 12   Re-eval: 04/26/12 Assessment Diagnosis: Cervical DDD and Right Shoulder Arthritis  Authorization: n/a  Authorization Time Period:    Authorization Visit#:   of    Subjective Symptoms/Limitations Symptoms: S:  It feels a little better. Pain Assessment Currently in Pain?: No/denies Pain Score: 0-No pain  Precautions/Restrictions  Precautions Precautions: None  Exercise/Treatments Supine Protraction: PROM;AROM;10 reps Horizontal ABduction: PROM;AROM;10 reps External Rotation: PROM;AROM;10 reps Internal Rotation: PROM;AROM;10 reps Flexion: PROM;AROM;10 reps ABduction: PROM;AROM;10 reps Seated Elevation: AROM;10 reps Extension: AROM;10 reps Row: AROM;10 reps Other Seated Exercises: cervical AROM flexion, ext, rotate L/R and lateral bend L/R Therapy Ball Flexion: 15 reps ABduction: 15 reps ROM / Strengthening / Isometric Strengthening UBE (Upper Arm Bike): 3' forward 3' reverse 1.0     Manual Therapy Manual Therapy: Myofascial release Myofascial Release: MFR and manual stretching to right shoulder, upper arm, pectoralis, scapular border, and upper trap as well as gentle cervical traction and release along SCM with goal to decrease restrictions and increase pain free A/PROM  Occupational Therapy Assessment and Plan OT Assessment and Plan Clinical Impression Statement: A:  Patient with full PROM/AROM so switched from AAROM to AROM and switched from pulleys to UBE. OT Plan: P:  Increase reps with AROM and add wall wash.   Goals Short Term Goals Time to Complete Short Term Goals: 3 weeks Short Term Goal 1: Patient will be educated on a HEP. Short Term Goal 2: Patient will increase AROM in his right  shoulder and neck by 50% for increased comfort and safety when driving. Short Term Goal 3: Patient will increase right shoulder strength to 4+/5 for increased ability to lift files at work. Short Term Goal 4: Patient will decrease pain in his neck and right shoulder to 4/10 when going to sleep. Short Term Goal 5: Patient will decrease fascial restrictions to min-mod in his neck and right shoulder region. Long Term Goals Time to Complete Long Term Goals: 6 weeks Long Term Goal 1: Patient will return to prior level of I with all B/IADLs, work, and leisure activities.   Long Term Goal 2: Patient will increase his right shoulder and neck AROM to WNL for increased ability to use his computer without pain. Long Term Goal 3: Patient will increase right shoulder strength to 5/5 for increased ability to lift files at work. Long Term Goal 4: Patient will decrease pain in his right neck and shoulder region to minimal. Long Term Goal 5: Patient will decrease fascial restrictions to minimal in his right shoulder and neck region.  Problem List Patient Active Problem List  Diagnosis  . DIABETES MELLITUS, TYPE II, ON INSULIN  . Morbid obesity  . SLEEP APNEA  . Multiple myeloma  . Arteriosclerotic cardiovascular disease (ASCVD)  . Hypertension  . Hyperlipidemia  . Acute on chronic renal failure  . Diarrhea  . Orthostasis  . Edema of both legs  . Systolic dysfunction  . Dehydration  . Arthritis, shoulder region  . DDD (degenerative disc disease), cervical  . Pain in joint, shoulder region  . Muscle weakness (generalized)    End of Session Activity Tolerance: Patient tolerated treatment well General Behavior During Session: [name] for tasks performed Cognition: [name] for tasks performed  GO    Thera Flake, Ewing Fandino L  04/06/2012, 10:20 AM

## 2012-04-07 ENCOUNTER — Ambulatory Visit (HOSPITAL_COMMUNITY)
Admission: RE | Admit: 2012-04-07 | Discharge: 2012-04-07 | Disposition: A | Discharge status: Home / Self Care | Payer: PRIVATE HEALTH INSURANCE | Source: Ambulatory Visit | Attending: Occupational Therapy | Admitting: Occupational Therapy

## 2012-04-07 NOTE — Progress Notes (Signed)
Occupational Therapy Treatment Patient Details  Name: VEIKKO BELOTTI MRN: [id number] Date of Birth: [birth year]  Today's Date: 04/07/2012 Time: 1030-1059 OT Time Calculation (min): 29 min Manual Therapy 1030-1059 29'  Visit#: 5  of 12   Re-eval: 04/26/12 Assessment Diagnosis: Cervical DDD and Right Shoulder Arthritis  Authorization: n/a  Authorization Time Period:    Authorization Visit#:   of    Subjective Symptoms/Limitations Symptoms: S:  My shoulder is a little sore today. Pain Assessment Currently in Pain?: No/denies Pain Score: 0-No pain  Precautions/Restrictions  Precautions Precautions: None  Exercise/Treatments Supine Protraction: PROM;10 reps Horizontal ABduction: PROM;10 reps External Rotation: PROM;10 reps Internal Rotation: PROM;10 reps Flexion: PROM;10 reps ABduction: PROM;10 reps         Manual Therapy Manual Therapy: Myofascial release Myofascial Release: MFR and manual stretching to right shoulder, upper arm, pectoralis, scapular border, and upper trap as well as gentle cervical traction and release along SCM with goal to decrease restrictions and increase pain free A/PROM  Occupational Therapy Assessment and Plan OT Assessment and Plan Clinical Impression Statement: A:  Patient arrived late so did not have time for ther-ex, tx focus addressing pain and restrictions with MFR and manual stretching. OT Plan: P:  Resume exercises and add wall wash.   Goals Short Term Goals Time to Complete Short Term Goals: 3 weeks Short Term Goal 1: Patient will be educated on a HEP. Short Term Goal 2: Patient will increase AROM in his right shoulder and neck by 50% for increased comfort and safety when driving. Short Term Goal 3: Patient will increase right shoulder strength to 4+/5 for increased ability to lift files at work. Short Term Goal 4: Patient will decrease pain in his neck and right shoulder to 4/10 when going to sleep. Short Term Goal 5: Patient  will decrease fascial restrictions to min-mod in his neck and right shoulder region. Long Term Goals Time to Complete Long Term Goals: 6 weeks Long Term Goal 1: Patient will return to prior level of I with all B/IADLs, work, and leisure activities.   Long Term Goal 2: Patient will increase his right shoulder and neck AROM to WNL for increased ability to use his computer without pain. Long Term Goal 3: Patient will increase right shoulder strength to 5/5 for increased ability to lift files at work. Long Term Goal 4: Patient will decrease pain in his right neck and shoulder region to minimal. Long Term Goal 5: Patient will decrease fascial restrictions to minimal in his right shoulder and neck region.  Problem List Patient Active Problem List  Diagnosis  . DIABETES MELLITUS, TYPE II, ON INSULIN  . Morbid obesity  . SLEEP APNEA  . Multiple myeloma  . Arteriosclerotic cardiovascular disease (ASCVD)  . Hypertension  . Hyperlipidemia  . Acute on chronic renal failure  . Diarrhea  . Orthostasis  . Edema of both legs  . Systolic dysfunction  . Dehydration  . Arthritis, shoulder region  . DDD (degenerative disc disease), cervical  . Pain in joint, shoulder region  . Muscle weakness (generalized)    End of Session Activity Tolerance: Patient tolerated treatment well General Behavior During Session: [facility] for tasks performed Cognition: [facility] for tasks performed  GO    Thera Flake, Meisha Salone L 04/07/2012, 11:39 AM

## 2012-04-09 ENCOUNTER — Encounter (HOSPITAL_COMMUNITY): Payer: PRIVATE HEALTH INSURANCE | Attending: Oncology | Admitting: Oncology

## 2012-04-09 ENCOUNTER — Encounter (HOSPITAL_BASED_OUTPATIENT_CLINIC_OR_DEPARTMENT_OTHER): Payer: PRIVATE HEALTH INSURANCE

## 2012-04-09 ENCOUNTER — Ambulatory Visit (HOSPITAL_COMMUNITY): Payer: PRIVATE HEALTH INSURANCE | Admitting: Specialist

## 2012-04-09 VITALS — BP 106/80 | HR 79 | Temp 98.5°F | Resp 18

## 2012-04-09 DIAGNOSIS — C9001 Multiple myeloma in remission: Secondary | ICD-10-CM

## 2012-04-09 DIAGNOSIS — L03119 Cellulitis of unspecified part of limb: Secondary | ICD-10-CM

## 2012-04-09 DIAGNOSIS — K9089 Other intestinal malabsorption: Secondary | ICD-10-CM

## 2012-04-09 DIAGNOSIS — X58XXXA Exposure to other specified factors, initial encounter: Secondary | ICD-10-CM | POA: Insufficient documentation

## 2012-04-09 DIAGNOSIS — K8689 Other specified diseases of pancreas: Secondary | ICD-10-CM

## 2012-04-09 DIAGNOSIS — C9000 Multiple myeloma not having achieved remission: Secondary | ICD-10-CM | POA: Insufficient documentation

## 2012-04-09 DIAGNOSIS — L02419 Cutaneous abscess of limb, unspecified: Secondary | ICD-10-CM

## 2012-04-09 LAB — CBC WITH DIFFERENTIAL/PLATELET
Basophils Absolute: 0 10*3/uL (ref 0.0–0.1)
Basophils Relative: 0 % (ref 0–1)
Eosinophils Absolute: 0 10*3/uL (ref 0.0–0.7)
Eosinophils Relative: 0 % (ref 0–5)
HCT: 35.5 % — ABNORMAL LOW (ref 39.0–52.0)
Hemoglobin: 11.8 g/dL — ABNORMAL LOW (ref 13.0–17.0)
Lymphocytes Relative: 11 % — ABNORMAL LOW (ref 12–46)
Lymphs Abs: 0.8 10*3/uL (ref 0.7–4.0)
MCH: 29.9 pg (ref 26.0–34.0)
MCHC: 33.2 g/dL (ref 30.0–36.0)
MCV: 89.9 fL (ref 78.0–100.0)
Monocytes Absolute: 0.2 10*3/uL (ref 0.1–1.0)
Monocytes Relative: 3 % (ref 3–12)
Neutro Abs: 6.2 10*3/uL (ref 1.7–7.7)
Neutrophils Relative %: 85 % — ABNORMAL HIGH (ref 43–77)
Platelets: 139 10*3/uL — ABNORMAL LOW (ref 150–400)
RBC: 3.95 MIL/uL — ABNORMAL LOW (ref 4.22–5.81)
RDW: 13.7 % (ref 11.5–15.5)
WBC: 7.3 10*3/uL (ref 4.0–10.5)

## 2012-04-09 LAB — COMPREHENSIVE METABOLIC PANEL
ALT: 39 U/L (ref 0–53)
AST: 30 U/L (ref 0–37)
Albumin: 3.7 g/dL (ref 3.5–5.2)
Alkaline Phosphatase: 77 U/L (ref 39–117)
BUN: 23 mg/dL (ref 6–23)
CO2: 22 mEq/L (ref 19–32)
Calcium: 8.9 mg/dL (ref 8.4–10.5)
Chloride: 102 mEq/L (ref 96–112)
Creatinine, Ser: 1.66 mg/dL — ABNORMAL HIGH (ref 0.50–1.35)
GFR calc Af Amer: 53 mL/min — ABNORMAL LOW (ref >90)
GFR calc non Af Amer: 46 mL/min — ABNORMAL LOW (ref >90)
Glucose, Bld: 161 mg/dL — ABNORMAL HIGH (ref 70–99)
Potassium: 3.2 mEq/L — ABNORMAL LOW (ref 3.5–5.1)
Sodium: 136 mEq/L (ref 135–145)
Total Bilirubin: 0.6 mg/dL (ref 0.3–1.2)
Total Protein: 6.3 g/dL (ref 6.0–8.3)

## 2012-04-09 MED ORDER — SODIUM CHLORIDE 0.9 % IJ SOLN
INTRAMUSCULAR | Status: AC
  Filled 2012-04-09: qty 10

## 2012-04-09 MED ORDER — LENALIDOMIDE 10 MG PO CAPS: ORAL_CAPSULE | ORAL | Status: DC

## 2012-04-09 MED ORDER — HEPARIN SOD (PORK) LOCK FLUSH 100 UNIT/ML IV SOLN
INTRAVENOUS | Status: AC
  Filled 2012-04-09: qty 5

## 2012-04-09 MED ORDER — HEPARIN SOD (PORK) LOCK FLUSH 100 UNIT/ML IV SOLN
250.0000 [IU] | Freq: Once | INTRAVENOUS | Status: DC | PRN
  Filled 2012-04-09: qty 5

## 2012-04-09 MED ORDER — HEPARIN SOD (PORK) LOCK FLUSH 100 UNIT/ML IV SOLN
500.0000 [IU] | Freq: Once | INTRAVENOUS | Status: AC
  Administered 2012-04-09: 500 [IU] via INTRAVENOUS
  Filled 2012-04-09: qty 5

## 2012-04-09 MED ORDER — ZOLEDRONIC ACID 4 MG/5ML IV CONC
2.0000 mg | Freq: Once | INTRAVENOUS | Status: AC
  Administered 2012-04-09: 2 mg via INTRAVENOUS
  Filled 2012-04-09: qty 2.5

## 2012-04-09 NOTE — Progress Notes (Signed)
Tolerated zometa infusion well.

## 2012-04-09 NOTE — Progress Notes (Signed)
Franklin Font, MD [place] [address]  1. Multiple myeloma     CURRENT THERAPY: Maintenance [name] 10 mg daily beginning in the middle of October of 2013.  INTERVAL HISTORY: [name] 51 y.o. male returns for  regular  visit for followup of Stage II multiple myeloma with a beta 2 microglobulin level less than 3.5 and albumin of 3.4, and of course, he presented with Kappa light chain disease. Presently in remission. Patient was initially on [name] 25 mg 14 days on and 7 days off, and Velcade with dexamethasone 20 mg twice a day on Fridays (07/2011- 11/2011). Time developed significant diarrhea and initially has [name] was placed on hold with the thought process and maybe this is causing his diarrhea. He continued with the Velcade and dexamethasone. Unfortunately, he continued with his diarrhea and all therapy was held. He was appreciated to have to get Aredia which was worked up extensively by Dr. Laural Golden. 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 He was placed on Creon and his loose stools have significantly diminished in frequency. He is now on [name] 10 mg daily maintenance beginning in the middle of October of 2013.  Since starting the maintenance [name] 10 mg daily, aspirin reports that he is having more loose stools. He explains that recently, he been having 4-5 loose stools daily. Prior to starting the [name] maintenance, he is having 1-2 loose stools daily. Of note, the patient has the working diagnosis of pancreatic enzyme insufficiency and was worked up extensively by Dr. Laural Golden. Dr. has the patient on Creon and this has helped significantly with his steatorrhea.   As a result, we will decrease [name]'s [name] to 10 mg 14 days on and 7 days off. A new prescription was written today to reflect this change in medication.  He was asked to contact the clinic next week after his 7 day break from [name] to let us know how his bowel  movements are doing.  [name]'s left lower extremity cellulitis has resolved since I put him on the antibiotic last month when he was seen.  [name] complains of bilateral dorsal aspect of foot discomfort. He explains he got new compression stockings and after work, he noted that the compression stocking shifted cranially and therefore gave him a deep compression ring around the dorsal aspect of his foot. It is at that site that the pain is appreciated. He explains that the pain was much worse yesterday compared to today. I suspect this is simply from the compression of the compression stocking on his foot. He will let us know how his feet are doing in the future.  [name] asks if he can have a shingles vaccine and he was informed that he may not have the shingles vaccine due to his disease.  Otherwise, [name] denies any complaints. Complete ROS questioning is negative. He is scheduled for laboratory work today and they'll be performed as scheduled. We'll then perform laboratory work in 8 weeks time for repeat lab work in addition to followup appointment.    Past Medical History  Diagnosis Date  . Arteriosclerotic cardiovascular disease (ASCVD)     MI-2000s; stent to the proximal LAD and diagonal in 2001; stress nuclear in 2008-impaired exercise capacity, left ventricular dilatation, moderately to severely depressed EF, apical, inferior and anteroseptal scar  . Sleep apnea   . Hypertension   . 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 Multiple myeloma(203.0) 07/01/2011  . Diabetes mellitus     Insulin  .  GERD (gastroesophageal reflux disease)   . Pedal edema     Venous insufficiency  . Obesity   . Hyperlipidemia   . Chronic kidney disease, stage 3, mod decreased GFR     Creatinine of 1.84 in 06/2011 and 1.5 in 07/2011  . Injection site reaction     has DIABETES MELLITUS, TYPE II, ON INSULIN; Morbid obesity; SLEEP APNEA; Multiple myeloma; Arteriosclerotic cardiovascular disease (ASCVD);  Hypertension; Hyperlipidemia; Acute on chronic renal failure; Diarrhea; Orthostasis; Edema of both legs; Systolic dysfunction; Dehydration; Arthritis, shoulder region; DDD (degenerative disc disease), cervical; Pain in joint, shoulder region; and Muscle weakness (generalized) on his problem list.      has no known allergies.  [name] does not currently have medications on file.  Past Surgical History  Procedure Date  . Laparoscopic gastric banding 2006  . Wrist surgery     Left; removal of bone fragment  . Incision and drainage abscess anal   . Abscess drainage     Scrotal  . Bone marrow biopsy 05/13/11  . Portacath placement 07/07/2011    Procedure: INSERTION PORT-A-CATH;  Surgeon: Scherry Ran, MD;  Location: [facility];  Service: General;  Laterality: N/A;  . Colonoscopy 11/28/2011    Procedure: COLONOSCOPY;  Surgeon: Rogene Houston, MD;  Location: [facility];  Service: Endoscopy;  Laterality: N/A;  930  . Esophagogastroduodenoscopy 01/02/2012    Procedure: ESOPHAGOGASTRODUODENOSCOPY (EGD);  Surgeon: Rogene Houston, MD;  Location: [facility];  Service: Endoscopy;  Laterality: N/A;  100  . Esophageal biopsy 01/02/2012    Procedure: BIOPSY;  Surgeon: Rogene Houston, MD;  Location: [facility];  Service: Endoscopy;  Laterality: N/A;    Denies any headaches, dizziness, double vision, fevers, chills, night sweats, nausea, vomiting, diarrhea, constipation, chest pain, heart palpitations, shortness of breath, blood in stool, black tarry stool, urinary pain, urinary burning, urinary frequency, hematuria.   PHYSICAL EXAMINATION  ECOG PERFORMANCE STATUS: 1 - Symptomatic but completely ambulatory  There were no vitals filed for this visit.  GENERAL:alert, no distress, morbidly obese and smiling SKIN: skin color, texture, turgor are normal, no rashes or significant lesions HEAD: Normocephalic, No masses, lesions, tenderness or abnormalities EYES: normal, Conjunctiva are pink and  non-injected EARS: External ears normal OROPHARYNX:lips, buccal mucosa, and tongue normal and mucous membranes are moist  NECK: supple, no adenopathy, trachea midline LYMPH:  no palpable lymphadenopathy BREAST:not examined LUNGS: clear to auscultation and percussion HEART: regular rate & rhythm, no murmurs, no gallops, S1 normal and S2 normal ABDOMEN:abdomen soft, non-tender, obese and normal bowel sounds BACK: Back symmetric, no curvature. EXTREMITIES:less then 2 second capillary refill, no joint deformities, effusion, or inflammation, no skin discoloration, no clubbing, no cyanosis, compression stockings in place. NEURO: alert & oriented x 3 with fluent speech, no focal motor/sensory deficits, gait normal    LABORATORY DATA: Results for Franklin, Waller (MRN [id number]) as of 04/09/2012 14:02  Ref. Range 03/11/2012 11:49 03/11/2012 11:49  Sodium Latest Range: 135-145 mEq/L  138  Potassium Latest Range: 3.5-5.1 mEq/L  3.3 (L)  Chloride Latest Range: 96-112 mEq/L  106  CO2 Latest Range: 19-32 mEq/L  20  BUN Latest Range: 6-23 mg/dL  23  Creatinine Latest Range: 0.50-1.35 mg/dL  1.28  Calcium Latest Range: 8.4-10.5 mg/dL  9.2  GFR calc non Af Amer Latest Range: >90 mL/min  62 (L)  GFR calc Af Amer Latest Range: >90 mL/min  72 (L)  Glucose Latest Range: 70-99 mg/dL  148 (H)  Alkaline Phosphatase Latest Range: 39-117 U/L  84  Albumin Latest Range: 3.5-5.2 g/dL  2.8 (L)  AST Latest Range: 0-37 U/L  22  ALT Latest Range: 0-53 U/L  32  Total Protein Latest Range: 6.0-8.3 g/dL 5.2 (L) 5.7 (L)  Total Bilirubin Latest Range: 0.3-1.2 mg/dL  0.2 (L)  Kappa free light chain Latest Range: 0.33-1.94 mg/dL 1.63   Albumin ELP Latest Range: 55.8-66.1 % 54.7 (L)   Alpha-1-Globulin Latest Range: 2.9-4.9 % 9.1 (H)   Alpha-2-Globulin Latest Range: 7.1-11.8 % 16.5 (H)   Beta Globulin Latest Range: 4.7-7.2 % 6.2   Beta 2 Latest Range: 3.2-6.5 % 6.0   Gamma Globulin Latest Range: 11.1-18.8 % 7.5 (L)    M-SPIKE, % No range found NOT DETECTED   SPE Interp. No range found (NOTE)   Comment No range found (NOTE)   IgG (Immunoglobin G), Serum Latest Range: [phone] mg/dL 392 (L)   IgA Latest Range: 68-379 mg/dL 55 (L)   IgM, Serum Latest Range: 41-251 mg/dL 75   Lamda free light chains Latest Range: 0.57-2.63 mg/dL 1.42   Kappa, lamda light chain ratio Latest Range: 0.26-1.65  1.15   WBC Latest Range: 4.0-10.5 K/uL 8.8   RBC Latest Range: 4.22-5.81 MIL/uL 3.70 (L)   Hemoglobin Latest Range: 13.0-17.0 g/dL 11.4 (L)   HCT Latest Range: 39.0-52.0 % 33.8 (L)   MCV Latest Range: 78.0-100.0 fL 91.4   MCH Latest Range: 26.0-34.0 pg 30.8   MCHC Latest Range: 30.0-36.0 g/dL 33.7   RDW Latest Range: 11.5-15.5 % 13.6   Platelets Latest Range: 150-400 K/uL 178   Neutrophils Relative Latest Range: 43-77 % 66   Lymphocytes Relative Latest Range: 12-46 % 24   Monocytes Relative Latest Range: 3-12 % 7   Eosinophils Relative Latest Range: 0-5 % 2   Basophils Relative Latest Range: 0-1 % 0   NEUT# Latest Range: 1.7-7.7 K/uL 5.8   Lymphocytes Absolute Latest Range: 0.7-4.0 K/uL 2.1   Monocytes Absolute Latest Range: 0.1-1.0 K/uL 0.6   Eosinophils Absolute Latest Range: 0.0-0.7 K/uL 0.2   Basophils Absolute Latest Range: 0.0-0.1 K/uL 0.0        ASSESSMENT:  1. Stage II multiple myeloma with a beta 2 microglobulin level less than 3.5 and albumin of 3.4, and of course, he presented with Kappa light chain disease. Now in remission and multiple myeloma therapy on hold due to #3.  2. Morbid obesity, status post an attempt at gastric bypass surgery in the past.  3. Steatorrhea  4. Left LE cellulitis   PLAN:  1. Continue Creon 36K two capsules with each meal and one with snack per Dr. Laural Golden. 2. I personally reviewed and went over laboratory results with the patient. 3. Labs today: CBC diff, CMET, MM panel 4. Zometa infusion today as scheduled 5. Change [name] to 10 mg 14 days on and 7 days off.  A  new prescription was written today and we'll to be submitted to his best with pharmacy to reflect this change in medication. 6. Return for Zometa in 4 weeks.  CMET before Zometa infusion. 7. Lab work in 8  weeks: CBC diff, CMET, MM panel 8. Patient informed that he may not have the shingles vaccination due to his disease  9. Return in 8 weeks for follow-up   All questions were answered. The patient knows to call the clinic with any problems, questions or concerns. We can certainly see the patient much sooner if necessary.  The patient and plan discussed with  Everardo All, MD and he is in agreement with the aforementioned.  KEFALAS,[name]

## 2012-04-09 NOTE — Patient Instructions (Signed)
[facility]  Discharge Instructions  RECOMMENDATIONS MADE BY THE CONSULTANT AND ANY TEST RESULTS WILL BE SENT TO YOUR REFERRING DOCTOR.   EXAM FINDINGS BY MD TODAY AND SIGNS AND SYMPTOMS TO REPORT TO CLINIC OR PRIMARY MD: Exam findings as discussed by T. Kefalas, PA-C.  SPECIAL INSTRUCTIONS/FOLLOW-UP: 1.  You will be receiving Zometa every 4 weeks. 2.  You are not eligible to receive a Shingles vaccine due to your MM. 3.  Continue with Creon as prescribed by Dr. Laural Golden. 4.  Hold your [name] for 7 days; call us back to let us know how your diarrhea symptoms are improving.  After you speak to [place], you will resume your [name] 14 days on and 7 days off. 5.  Return for follow-up appointment in 8 weeks. 6.  Your MM labs work will now be every 8 weeks, instead of every 4.  I acknowledge that I have been informed and understand all the instructions given to me and received a copy. I do not have any more questions at this time, but understand that I may call the [facility] at [facility] at [phone] during business hours should I have any further questions or need assistance in obtaining follow-up care.    __________________________________________  _____________  __________ Signature of Patient or Authorized Representative            Date                   Time    __________________________________________ Nurse's Signature

## 2012-04-12 ENCOUNTER — Ambulatory Visit (HOSPITAL_COMMUNITY)
Admission: RE | Admit: 2012-04-12 | Discharge: 2012-04-12 | Disposition: A | Discharge status: Home / Self Care | Payer: PRIVATE HEALTH INSURANCE | Source: Ambulatory Visit | Attending: Occupational Therapy | Admitting: Occupational Therapy

## 2012-04-12 NOTE — Progress Notes (Signed)
Occupational Therapy Treatment Patient Details  Name: ZAKYRIE JAKES MRN: [id number] Date of Birth: 1961-03-24  Today's Date: 04/12/2012 Time: 1700-1740 OT Time Calculation (min): 40 min Manual Therapy 500-520 20' Therapeutic Exercise 521-540 57'  Visit#: 6  of 12   Re-eval: 04/26/12 Assessment Diagnosis: Cervical DDD and Right Shoulder Arthritis  Authorization: n/a  Authorization Time Period:    Authorization Visit#:   of    Subjective Symptoms/Limitations Symptoms: S:  I tired to pick up a jug of tea and my arm was too weak.  Precautions/Restrictions  Precautions Precautions: None  Exercise/Treatments Supine Protraction: PROM;AROM;10 reps Horizontal ABduction: PROM;AROM;10 reps External Rotation: PROM;AROM;10 reps Internal Rotation: PROM;AROM;10 reps Flexion: PROM;AROM;10 reps ABduction: PROM;AROM;10 reps Seated Elevation: AROM;10 reps Extension: AROM;10 reps Row: AROM;10 reps Other Seated Exercises: cervical AROM flexion, ext, rotate L/R and lateral bend L/R Therapy Ball Flexion: 15 reps ABduction: 15 reps ROM / Strengthening / Isometric Strengthening UBE (Upper Arm Bike): time Wall Wash: 2'       Manual Therapy Manual Therapy: Myofascial release Myofascial Release: MFR and manual stretching to right shoulder, upper arm, pectoralis, scapular border, and upper trap as well as gentle cervical traction and release along SCM with goal to decrease restrictions and increase pain free A/PROM   Occupational Therapy Assessment and Plan OT Assessment and Plan Clinical Impression Statement: A:  Added AROM in supine and wall wash.  Attempted AROM seated secondary to having full ROM, pt. unable to complete in seated. OT Plan: P:  Increase reps in supine.   Goals Short Term Goals Time to Complete Short Term Goals: 3 weeks Short Term Goal 1: Patient will be educated on a HEP. Short Term Goal 2: Patient will increase AROM in his right shoulder and neck by 50% for  increased comfort and safety when driving. Short Term Goal 3: Patient will increase right shoulder strength to 4+/5 for increased ability to lift files at work. Short Term Goal 4: Patient will decrease pain in his neck and right shoulder to 4/10 when going to sleep. Short Term Goal 5: Patient will decrease fascial restrictions to min-mod in his neck and right shoulder region. Long Term Goals Time to Complete Long Term Goals: 6 weeks Long Term Goal 1: Patient will return to prior level of I with all B/IADLs, work, and leisure activities.   Long Term Goal 2: Patient will increase his right shoulder and neck AROM to WNL for increased ability to use his computer without pain. Long Term Goal 3: Patient will increase right shoulder strength to 5/5 for increased ability to lift files at work. Long Term Goal 4: Patient will decrease pain in his right neck and shoulder region to minimal. Long Term Goal 5: Patient will decrease fascial restrictions to minimal in his right shoulder and neck region.  Problem List Patient Active Problem List  Diagnosis  . DIABETES MELLITUS, TYPE II, ON INSULIN  . Morbid obesity  . SLEEP APNEA  . Multiple myeloma  . Arteriosclerotic cardiovascular disease (ASCVD)  . Hypertension  . Hyperlipidemia  . Acute on chronic renal failure  . Diarrhea  . Orthostasis  . Edema of both legs  . Systolic dysfunction  . Dehydration  . Arthritis, shoulder region  . DDD (degenerative disc disease), cervical  . Pain in joint, shoulder region  . Muscle weakness (generalized)    End of Session Activity Tolerance: Patient tolerated treatment well General Behavior During Session: [facility] for tasks performed Cognition: [facility] for tasks performed  GO  Thera Flake, Rawn Quiroa L 04/12/2012, 5:47 PM

## 2012-04-13 LAB — MULTIPLE MYELOMA PANEL, SERUM
Albumin ELP: 58.7 % (ref 55.8–66.1)
Alpha-1-Globulin: 7 % — ABNORMAL HIGH (ref 2.9–4.9)
Alpha-2-Globulin: 13.4 % — ABNORMAL HIGH (ref 7.1–11.8)
Beta 2: 5.8 % (ref 3.2–6.5)
Beta Globulin: 6.8 % (ref 4.7–7.2)
Gamma Globulin: 8.3 % — ABNORMAL LOW (ref 11.1–18.8)
IgA: 66 mg/dL — ABNORMAL LOW (ref 68–379)
IgG (Immunoglobin G), Serum: 472 mg/dL — ABNORMAL LOW (ref 650–1600)
IgM, Serum: 97 mg/dL (ref 41–251)
M-Spike, %: NOT DETECTED g/dL
Total Protein: 6.1 g/dL (ref 6.0–8.3)

## 2012-04-13 LAB — KAPPA/LAMBDA LIGHT CHAINS
Kappa free light chain: 2.75 mg/dL — ABNORMAL HIGH (ref 0.33–1.94)
Kappa, lambda light chain ratio: 1.48 (ref 0.26–1.65)
Lambda free light chains: 1.86 mg/dL (ref 0.57–2.63)

## 2012-04-14 ENCOUNTER — Ambulatory Visit (HOSPITAL_COMMUNITY): Payer: PRIVATE HEALTH INSURANCE | Admitting: Occupational Therapy

## 2012-04-16 ENCOUNTER — Other Ambulatory Visit (HOSPITAL_COMMUNITY): Payer: Self-pay | Admitting: Oncology

## 2012-04-16 ENCOUNTER — Ambulatory Visit (HOSPITAL_COMMUNITY)
Admission: RE | Admit: 2012-04-16 | Discharge: 2012-04-16 | Disposition: A | Discharge status: Home / Self Care | Payer: PRIVATE HEALTH INSURANCE | Source: Ambulatory Visit | Attending: Orthopedic Surgery | Admitting: Orthopedic Surgery

## 2012-04-16 NOTE — Progress Notes (Signed)
[name] is here today because I asked him to call the clinic after having a 7 day break from [name] due to increased diarrhea.  He reports that it has slightly improved during this break, but he wishes it was more improved.  So I have asked him to hold [name] for 1 more week.  He is to let us know how he is doing with regards to his bowel movements.  If better, he will start 14 days on and 7 days off at that time.  If not better, we may need to consider sending him back to GI for evaluation.   He also reports a RUQ nonspecific abdominal discomfort that comes and goes.  Increases with deep breathing.  He is unable to describe it.  No radiation of discomfort.  He let us know how it is doing next week.  Ely Spragg

## 2012-04-16 NOTE — Progress Notes (Signed)
Agreed.  Is it ok to wait 7 more days or should we call Dr. Laural Golden now?

## 2012-04-16 NOTE — Progress Notes (Signed)
Occupational Therapy Treatment Patient Details  Name: SIG CLISHAM MRN: [id number] Date of Birth: Sep 10, 1960  Today's Date: 04/16/2012 Time: [id number] OT Time Calculation (min): 46 min Manual Therapy [phone] 15' Therapeutic Exercise [phone] 30'  Visit#: 7  of 12   Re-eval: 04/26/12    Authorization: n/a  Authorization Time Period:    Authorization Visit#:   of    Subjective Symptoms/Limitations Symptoms: S:  My pain is not as consistent, it is much less. Pain Assessment Currently in Pain?: Yes Pain Score:   1 Pain Location: Shoulder Pain Orientation: Right Pain Type: Acute pain  Precautions/Restrictions     Exercise/Treatments Supine Protraction: PROM;AROM;12 reps Horizontal ABduction: PROM;AROM;12 reps External Rotation: PROM;AROM;12 reps Internal Rotation: PROM;AROM;12 reps Flexion: PROM;AROM;12 reps ABduction: PROM;AROM;12 reps Seated Elevation: AROM;12 reps Extension: AROM;12 reps Row: AROM;12 reps Protraction: AAROM;10 reps Horizontal ABduction: AAROM;10 reps External Rotation: AAROM;10 reps Internal Rotation: AAROM;10 reps Flexion: AAROM;10 reps Abduction: AAROM;10 reps Other Seated Exercises: cervical AROM flexion, ext, rotate L/R and lateral bend L/R Therapy Ball Flexion: 15 reps ABduction: 15 reps Right/Left: 5 reps ROM / Strengthening / Isometric Strengthening Wall Wash: 2'        Manual Therapy Manual Therapy: Myofascial release Myofascial Release: MFR and manual stretching to right shoulder, upper arm, pectoralis, scapular border, and upper trap as well as gentle cervical traction and release along SCM with goal to decrease restrictions and increase pain free A/PROM  Occupational Therapy Assessment and Plan OT Assessment and Plan Clinical Impression Statement: A:  Attempted AROM seated again, patient still unable so began AAROM in seated.  OT Plan: P:  Switch scapular strengtheing to tband vs AROM   Goals Short Term Goals Time  to Complete Short Term Goals: 3 weeks Short Term Goal 1: Patient will be educated on a HEP. Short Term Goal 1 Progress: Met Short Term Goal 2: Patient will increase AROM in his right shoulder and neck by 50% for increased comfort and safety when driving. Short Term Goal 2 Progress: Met Short Term Goal 3: Patient will increase right shoulder strength to 4+/5 for increased ability to lift files at work. Short Term Goal 3 Progress: Progressing toward goal Short Term Goal 4: Patient will decrease pain in his neck and right shoulder to 4/10 when going to sleep. Short Term Goal 4 Progress: Progressing toward goal Short Term Goal 5: Patient will decrease fascial restrictions to min-mod in his neck and right shoulder region. Short Term Goal 5 Progress: Met Long Term Goals Time to Complete Long Term Goals: 6 weeks Long Term Goal 1: Patient will return to prior level of I with all B/IADLs, work, and leisure activities.   Long Term Goal 1 Progress: Progressing toward goal Long Term Goal 2: Patient will increase his right shoulder and neck AROM to WNL for increased ability to use his computer without pain. Long Term Goal 2 Progress: Progressing toward goal Long Term Goal 3: Patient will increase right shoulder strength to 5/5 for increased ability to lift files at work. Long Term Goal 3 Progress: Progressing toward goal Long Term Goal 4: Patient will decrease pain in his right neck and shoulder region to minimal. Long Term Goal 4 Progress: Progressing toward goal Long Term Goal 5: Patient will decrease fascial restrictions to minimal in his right shoulder and neck region. Long Term Goal 5 Progress: Progressing toward goal  Problem List Patient Active Problem List  Diagnosis  . DIABETES MELLITUS, TYPE II, ON INSULIN  . Morbid obesity  . SLEEP  APNEA  . Multiple myeloma  . Arteriosclerotic cardiovascular disease (ASCVD)  . Hypertension  . Hyperlipidemia  . Acute on chronic renal failure  .  Diarrhea  . Orthostasis  . Edema of both legs  . Systolic dysfunction  . Dehydration  . Arthritis, shoulder region  . DDD (degenerative disc disease), cervical  . Pain in joint, shoulder region  . Muscle weakness (generalized)    End of Session Activity Tolerance: Patient tolerated treatment well General Behavior During Session: [facility] for tasks performed Cognition: [facility] for tasks performed  GO    Thera Flake, Marvelene Stoneberg L 04/16/2012, 10:56 AM

## 2012-04-20 ENCOUNTER — Other Ambulatory Visit (HOSPITAL_COMMUNITY): Payer: Self-pay | Admitting: Oncology

## 2012-04-20 DIAGNOSIS — E876 Hypokalemia: Secondary | ICD-10-CM

## 2012-04-20 MED ORDER — POTASSIUM CHLORIDE CRYS ER 20 MEQ PO TBCR: EXTENDED_RELEASE_TABLET | ORAL | Status: DC

## 2012-05-03 ENCOUNTER — Telehealth (HOSPITAL_COMMUNITY): Payer: Self-pay | Admitting: Oncology

## 2012-05-03 NOTE — Telephone Encounter (Signed)
Patient reported starting diarrhea on Wednesday 11/27 after starting his [name] on Saturday 11/23.  He reports that it is "explosive."  He reports that it was becoming more formed and actually semi-solid before starting the [name] he reports.  So I have asked him to hold [name] and call us on Wednesday and let us know how he is doing.   A copy of this was sent to Dr. [name] for his input.   Patient and plan will be discussed with Dr. [name] in the near future.  KEFALAS,[name]

## 2012-05-04 ENCOUNTER — Ambulatory Visit: Payer: PRIVATE HEALTH INSURANCE | Admitting: Orthopedic Surgery

## 2012-05-05 ENCOUNTER — Telehealth (HOSPITAL_COMMUNITY): Payer: Self-pay

## 2012-05-05 ENCOUNTER — Other Ambulatory Visit (HOSPITAL_COMMUNITY): Payer: Self-pay | Admitting: Oncology

## 2012-05-05 NOTE — Telephone Encounter (Signed)
Message copied by Mellissa Kohut on Wed May 05, 2012  6:09 PM ------      Message from: Baird [facility]      Created: Wed May 05, 2012  5:33 PM       Also, he is to start his [name] back up.  Continue the cycle as scheduled and ignore the 2 missed days of doses.  (ie pretend like he took the revlimid today and yesterday and go on his break from [name] as he was scheduled to do so before the break in therapy).            I am going to refer him back to [facility] and this is sent to [facility].

## 2012-05-05 NOTE — Telephone Encounter (Signed)
Patient notified to resume [name] for the remainder of this cycle (started revlimid on 11/23 and 14 days will end on 12/6)  and rescheduled for lab work and zometa on 05/07/12.  Follow-up appointment with Dr. [name] scheduled for 06/15/12.  Patient verbalized understanding of instructions.

## 2012-05-05 NOTE — Telephone Encounter (Signed)
Message copied by Mellissa Kohut on Wed May 05, 2012  6:03 PM ------      Message from: Baird Cancer      Created: Wed May 05, 2012  5:33 PM       Also, he is to start his [name] back up.  Continue the cycle as scheduled and ignore the 2 missed days of doses.  (ie pretend like he took the revlimid today and yesterday and go on his break from [name] as he was scheduled to do so before the break in therapy).            I am going to refer him back to [facility] and this is sent to [facility].

## 2012-05-06 ENCOUNTER — Telehealth (HOSPITAL_COMMUNITY): Payer: Self-pay | Admitting: *Deleted

## 2012-05-06 ENCOUNTER — Encounter (HOSPITAL_COMMUNITY): Payer: Self-pay | Admitting: Emergency Medicine

## 2012-05-06 ENCOUNTER — Other Ambulatory Visit (HOSPITAL_COMMUNITY): Payer: Self-pay | Admitting: Oncology

## 2012-05-06 ENCOUNTER — Encounter (HOSPITAL_COMMUNITY): Payer: PRIVATE HEALTH INSURANCE

## 2012-05-06 ENCOUNTER — Emergency Department (HOSPITAL_COMMUNITY): Payer: PRIVATE HEALTH INSURANCE

## 2012-05-06 ENCOUNTER — Other Ambulatory Visit (HOSPITAL_COMMUNITY): Payer: PRIVATE HEALTH INSURANCE

## 2012-05-06 ENCOUNTER — Encounter (HOSPITAL_COMMUNITY): Payer: PRIVATE HEALTH INSURANCE | Attending: Oncology | Admitting: Oncology

## 2012-05-06 ENCOUNTER — Telehealth (INDEPENDENT_AMBULATORY_CARE_PROVIDER_SITE_OTHER): Payer: Self-pay | Admitting: *Deleted

## 2012-05-06 ENCOUNTER — Emergency Department (HOSPITAL_COMMUNITY)
Admission: EM | Admit: 2012-05-06 | Discharge: 2012-05-06 | Disposition: A | Discharge status: Home / Self Care | Payer: PRIVATE HEALTH INSURANCE | Attending: Emergency Medicine | Admitting: Emergency Medicine

## 2012-05-06 VITALS — BP 121/85 | HR 86 | Temp 97.7°F | Resp 20

## 2012-05-06 DIAGNOSIS — Z79899 Other long term (current) drug therapy: Secondary | ICD-10-CM | POA: Insufficient documentation

## 2012-05-06 DIAGNOSIS — N453 Epididymo-orchitis: Secondary | ICD-10-CM | POA: Insufficient documentation

## 2012-05-06 DIAGNOSIS — Z9889 Other specified postprocedural states: Secondary | ICD-10-CM | POA: Insufficient documentation

## 2012-05-06 DIAGNOSIS — G4730 Sleep apnea, unspecified: Secondary | ICD-10-CM | POA: Insufficient documentation

## 2012-05-06 DIAGNOSIS — E119 Type 2 diabetes mellitus without complications: Secondary | ICD-10-CM | POA: Insufficient documentation

## 2012-05-06 DIAGNOSIS — E669 Obesity, unspecified: Secondary | ICD-10-CM | POA: Insufficient documentation

## 2012-05-06 DIAGNOSIS — I129 Hypertensive chronic kidney disease with stage 1 through stage 4 chronic kidney disease, or unspecified chronic kidney disease: Secondary | ICD-10-CM | POA: Insufficient documentation

## 2012-05-06 DIAGNOSIS — E785 Hyperlipidemia, unspecified: Secondary | ICD-10-CM | POA: Insufficient documentation

## 2012-05-06 DIAGNOSIS — Z87891 Personal history of nicotine dependence: Secondary | ICD-10-CM | POA: Insufficient documentation

## 2012-05-06 DIAGNOSIS — N451 Epididymitis: Secondary | ICD-10-CM

## 2012-05-06 DIAGNOSIS — K219 Gastro-esophageal reflux disease without esophagitis: Secondary | ICD-10-CM | POA: Insufficient documentation

## 2012-05-06 DIAGNOSIS — Z9861 Coronary angioplasty status: Secondary | ICD-10-CM | POA: Insufficient documentation

## 2012-05-06 DIAGNOSIS — Z8679 Personal history of other diseases of the circulatory system: Secondary | ICD-10-CM | POA: Insufficient documentation

## 2012-05-06 DIAGNOSIS — N183 Chronic kidney disease, stage 3 unspecified: Secondary | ICD-10-CM | POA: Insufficient documentation

## 2012-05-06 DIAGNOSIS — C9000 Multiple myeloma not having achieved remission: Secondary | ICD-10-CM

## 2012-05-06 DIAGNOSIS — R197 Diarrhea, unspecified: Secondary | ICD-10-CM

## 2012-05-06 DIAGNOSIS — R05 Cough: Secondary | ICD-10-CM | POA: Insufficient documentation

## 2012-05-06 DIAGNOSIS — R809 Proteinuria, unspecified: Secondary | ICD-10-CM | POA: Insufficient documentation

## 2012-05-06 DIAGNOSIS — X58XXXA Exposure to other specified factors, initial encounter: Secondary | ICD-10-CM | POA: Insufficient documentation

## 2012-05-06 DIAGNOSIS — I2510 Atherosclerotic heart disease of native coronary artery without angina pectoris: Secondary | ICD-10-CM | POA: Insufficient documentation

## 2012-05-06 DIAGNOSIS — Z7982 Long term (current) use of aspirin: Secondary | ICD-10-CM | POA: Insufficient documentation

## 2012-05-06 DIAGNOSIS — R059 Cough, unspecified: Secondary | ICD-10-CM | POA: Insufficient documentation

## 2012-05-06 DIAGNOSIS — N509 Disorder of male genital organs, unspecified: Secondary | ICD-10-CM

## 2012-05-06 DIAGNOSIS — Z794 Long term (current) use of insulin: Secondary | ICD-10-CM | POA: Insufficient documentation

## 2012-05-06 LAB — CBC WITH DIFFERENTIAL/PLATELET
Basophils Absolute: 0 10*3/uL (ref 0.0–0.1)
Basophils Relative: 0 % (ref 0–1)
Eosinophils Absolute: 0.3 10*3/uL (ref 0.0–0.7)
Eosinophils Relative: 2 % (ref 0–5)
HCT: 34.8 % — ABNORMAL LOW (ref 39.0–52.0)
Hemoglobin: 11.7 g/dL — ABNORMAL LOW (ref 13.0–17.0)
Lymphocytes Relative: 11 % — ABNORMAL LOW (ref 12–46)
Lymphs Abs: 1.7 10*3/uL (ref 0.7–4.0)
MCH: 30.3 pg (ref 26.0–34.0)
MCHC: 33.6 g/dL (ref 30.0–36.0)
MCV: 90.2 fL (ref 78.0–100.0)
Monocytes Absolute: 2.1 10*3/uL — ABNORMAL HIGH (ref 0.1–1.0)
Monocytes Relative: 14 % — ABNORMAL HIGH (ref 3–12)
Neutro Abs: 10.6 10*3/uL — ABNORMAL HIGH (ref 1.7–7.7)
Neutrophils Relative %: 72 % (ref 43–77)
Platelets: 170 10*3/uL (ref 150–400)
RBC: 3.86 MIL/uL — ABNORMAL LOW (ref 4.22–5.81)
RDW: 14.7 % (ref 11.5–15.5)
WBC: 14.7 10*3/uL — ABNORMAL HIGH (ref 4.0–10.5)

## 2012-05-06 LAB — URINE MICROSCOPIC-ADD ON

## 2012-05-06 LAB — COMPREHENSIVE METABOLIC PANEL
ALT: 24 U/L (ref 0–53)
AST: 14 U/L (ref 0–37)
Albumin: 2.8 g/dL — ABNORMAL LOW (ref 3.5–5.2)
Alkaline Phosphatase: 75 U/L (ref 39–117)
BUN: 27 mg/dL — ABNORMAL HIGH (ref 6–23)
CO2: 20 mEq/L (ref 19–32)
Calcium: 8.5 mg/dL (ref 8.4–10.5)
Chloride: 107 mEq/L (ref 96–112)
Creatinine, Ser: 2.05 mg/dL — ABNORMAL HIGH (ref 0.50–1.35)
GFR calc Af Amer: 41 mL/min — ABNORMAL LOW (ref >90)
GFR calc non Af Amer: 35 mL/min — ABNORMAL LOW (ref >90)
Glucose, Bld: 117 mg/dL — ABNORMAL HIGH (ref 70–99)
Potassium: 3.9 mEq/L (ref 3.5–5.1)
Sodium: 140 mEq/L (ref 135–145)
Total Bilirubin: 0.6 mg/dL (ref 0.3–1.2)
Total Protein: 6.4 g/dL (ref 6.0–8.3)

## 2012-05-06 LAB — LACTATE DEHYDROGENASE: LDH: 245 U/L (ref 94–250)

## 2012-05-06 LAB — URINALYSIS, ROUTINE W REFLEX MICROSCOPIC
Bilirubin Urine: NEGATIVE
Glucose, UA: NEGATIVE mg/dL
Ketones, ur: NEGATIVE mg/dL
Nitrite: NEGATIVE
Protein, ur: 100 mg/dL — AB
Specific Gravity, Urine: 1.02 (ref 1.005–1.030)
Urobilinogen, UA: 0.2 mg/dL (ref 0.0–1.0)
pH: 5.5 (ref 5.0–8.0)

## 2012-05-06 MED ORDER — CIPROFLOXACIN HCL 500 MG PO TABS: 500.0000 mg | ORAL_TABLET | Freq: Two times a day (BID) | ORAL | Status: DC

## 2012-05-06 MED ORDER — HYDROCODONE-ACETAMINOPHEN 5-325 MG PO TABS: 1.0000 | ORAL_TABLET | Freq: Four times a day (QID) | ORAL | Status: AC | PRN

## 2012-05-06 MED ORDER — ACYCLOVIR 400 MG PO TABS: 400.0000 mg | ORAL_TABLET | ORAL | Status: DC

## 2012-05-06 MED ORDER — HEPARIN SOD (PORK) LOCK FLUSH 100 UNIT/ML IV SOLN
INTRAVENOUS | Status: AC
  Filled 2012-05-06: qty 5

## 2012-05-06 MED ORDER — ONDANSETRON HCL 4 MG/2ML IJ SOLN
4.0000 mg | Freq: Once | INTRAMUSCULAR | Status: AC
  Administered 2012-05-06: 4 mg via INTRAVENOUS
  Filled 2012-05-06: qty 2

## 2012-05-06 MED ORDER — SODIUM CHLORIDE 0.9 % IV SOLN: INTRAVENOUS | Status: DC

## 2012-05-06 MED ORDER — HYDROMORPHONE HCL PF 1 MG/ML IJ SOLN
1.0000 mg | Freq: Once | INTRAMUSCULAR | Status: AC
  Administered 2012-05-06: 1 mg via INTRAVENOUS
  Filled 2012-05-06: qty 1

## 2012-05-06 MED ORDER — CIPROFLOXACIN IN D5W 400 MG/200ML IV SOLN
400.0000 mg | Freq: Once | INTRAVENOUS | Status: AC
  Administered 2012-05-06: 400 mg via INTRAVENOUS
  Filled 2012-05-06: qty 200

## 2012-05-06 NOTE — Progress Notes (Signed)
[name] is here as a walk-in today.  He reports right leg pain and groin pain.  So [name] was seen as a walk-in.  He is escorted in a wheelchair by his Brother (whom I have never met in the past).  [name] is never in a wheelchair so this is a drastic change for him.  He reports that on Sunday, he "slide" out of his truck (on purpose) while exiting the vehicle.  He twisted and noted a "jolt" of pain in his right groin.  Since that time, it has worsened.    On further discussion, he described the pain as sharp.  It originates in his right testicle and radiates down the right medial side of this right thigh.  Movement and pressure (Bowel Movement) increase the pain. When he stands, his right groin is very tender.   BP 121/85  Pulse 86  Temp 97.7 F (36.5 C) (Oral)  Resp 20 General: Patient seen in wheelchair.  The patient is assisted with standing by myself and his brother.  Significant amount of pain is appreciated with this motion. HEENT: Atraumatic, normocephalic, anicteric sclera Extremities: No asymmetric edema noted.   Genitalia: Right testicle is very tender to palpation.  Right scrotum/testicle is swollen and about 5-6 cm round.  Left testicle is higher in the scrotal sack and not tender to palpation. Neuro: No focal deficits noted.   Assessment: 1. Right scrotal/testicular pain 2. Right scrotal/testicular swelling 3. Stage II multiple myeloma with a beta 2 microglobulin level less than 3.5 and albumin of 3.4, and of course, he presented with Kappa light chain disease. Presently in remission. Patient was initially on [name] 25 mg 14 days on and 7 days off, and Velcade with dexamethasone 20 mg twice a day on Fridays (07/2011- 11/2011). Over time he developed significant diarrhea and initially [name] was placed on hold thinking that maybe this was causing his diarrhea. He continued with the Velcade and dexamethasone. Unfortunately, he continued with his diarrhea and all therapy was held. He was  appreciated to have to get Steatorrhea which was worked up extensively by Dr. Laural Golden. 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 He was placed on Creon and his loose stools have significantly diminished in frequency. He was then restarted on his maintenance [name] when his diarrhea improved in the middle of October 2013.  Since that time, his does has been decreased to 10 mg 14 days on and 7 days off. Diarrhea is becoming more of an issue again and we are going to get him back to see Dr. Laural Golden again.    Plan: 1. I spoke with Dr. Eulis Foster in the ED and filled him in on the patient's case.  Will escort the patient to the ED for further evaluation.  Patient and plan will be discussed with Dr. [name] in the near future.  KEFALAS,[name]

## 2012-05-06 NOTE — Telephone Encounter (Signed)
Patient is currently a patient in [facility].

## 2012-05-06 NOTE — Telephone Encounter (Signed)
Our office rec'd a call from [facility] at the [facility] ask per Dr.Neijstrom that the patient be seen,as the patient has had a bout with diarrhea. At this time it was advised that there was no appointments until February with Dr.Rehman.

## 2012-05-06 NOTE — ED Notes (Signed)
Pt presents with swelling and pain in right testicle since Sunday. Pt reports worsening pain throughout the day. Pt has had lab work drawn today at [facility], cbc and bmp already resulted in computer. NAD noted at this time.

## 2012-05-06 NOTE — Telephone Encounter (Signed)
I called [name] and spoke with her and let her know that I would call [name]). I have called him and ask that he call me

## 2012-05-06 NOTE — Telephone Encounter (Signed)
I called Katie at the house number provided. He answered and told me that he was trying to get up to the hospital and that he would call me back

## 2012-05-06 NOTE — ED Provider Notes (Signed)
History  This chart was scribed for Mervin Kung, MD by Era Bumpers, ED scribe. This patient was seen in room APA11/APA11 and the patient's care was started at 1524.   CSN: [id number]  Arrival date & time 05/06/12  1524   First MD Initiated Contact with Patient 05/06/12 1536      Chief Complaint  Patient presents with  . Testicle Pain   Patient is a 51 y.o. male presenting with testicular pain. The history is provided by the patient. No language interpreter was used.  Testicle Pain This is a new problem. The current episode started more than 2 days ago. The problem occurs constantly. The problem has been gradually worsening. Pertinent negatives include no chest pain, no abdominal pain and no shortness of breath. The symptoms are aggravated by walking. Nothing relieves the symptoms. He has tried nothing for the symptoms.   [name] is a 51 y.o. male who presents to the Emergency Department complaining of constant gradually worsening right testicle pain/swelling since 4 days ago and was sent here from Dr. Jaclyn Prime office today after appointment for this new problem. He is currently a patient at Dr. Jaclyn Prime office for multiple myelomas and receiving treatment for 14 days at a time with 7 days off since January of this year. He states his appointment today was not routine but had blood work at their office and was last seen at their office about 1 month ago. He states his right testicular pain/swelling began 4 days ago while getting out of his truck. He denies any similar previous problems but did have a scrotal abscess years ago.  He states a mild cough and has been having diarrhea lately but associated with his CA tx. He denies fever/chills, SOB, dysuria, hematuria, abdominal pain.  His PCP is Dr. [name]  Past Medical History  Diagnosis Date  . Arteriosclerotic cardiovascular disease (ASCVD)     MI-2000s; stent to the proximal LAD and diagonal in 2001; stress nuclear in  2008-impaired exercise capacity, left ventricular dilatation, moderately to severely depressed EF, apical, inferior and anteroseptal scar  . Sleep apnea   . Hypertension   . 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 Multiple myeloma(203.0) 07/01/2011  . Diabetes mellitus     Insulin  . GERD (gastroesophageal reflux disease)   . Pedal edema     Venous insufficiency  . Obesity   . Hyperlipidemia   . Chronic kidney disease, stage 3, mod decreased GFR     Creatinine of 1.84 in 06/2011 and 1.5 in 07/2011  . Injection site reaction     Past Surgical History  Procedure Date  . Laparoscopic gastric banding 2006  . Wrist surgery     Left; removal of bone fragment  . Incision and drainage abscess anal   . Abscess drainage     Scrotal  . Bone marrow biopsy 05/13/11  . Portacath placement 07/07/2011    Procedure: INSERTION PORT-A-CATH;  Surgeon: Scherry Ran, MD;  Location: [facility];  Service: General;  Laterality: N/A;  . Colonoscopy 11/28/2011    Procedure: COLONOSCOPY;  Surgeon: Rogene Houston, MD;  Location: [facility];  Service: Endoscopy;  Laterality: N/A;  930  . Esophagogastroduodenoscopy 01/02/2012    Procedure: ESOPHAGOGASTRODUODENOSCOPY (EGD);  Surgeon: Rogene Houston, MD;  Location: [facility];  Service: Endoscopy;  Laterality: N/A;  100  . Esophageal biopsy 01/02/2012    Procedure: BIOPSY;  Surgeon: Rogene Houston, MD;  Location: [facility];  Service: Endoscopy;  Laterality: N/A;    Family History  Problem Relation Age of Onset  . Heart disease Mother   . Cancer Mother   . Diabetes Father   . Anesthesia problems Neg Hx   . Hypotension Neg Hx   . Malignant hyperthermia Neg Hx   . Pseudochol deficiency Neg Hx   . Arthritis      History  Substance Use Topics  . Smoking status: Former Smoker    Types: Cigarettes, Cigars    Quit date: 05/17/2001  . Smokeless tobacco: Never Used  . Alcohol Use: No      Review of Systems  Constitutional: Negative for fever and  chills.  HENT: Negative for congestion.   Respiratory: Negative for shortness of breath.   Cardiovascular: Negative for chest pain.  Gastrointestinal: Positive for diarrhea. Negative for nausea, vomiting and abdominal pain.  Genitourinary: Positive for testicular pain (right testicular pain). Negative for dysuria and hematuria.  Skin: Negative for rash.  Neurological: Negative for weakness.  All other systems reviewed and are negative.    Allergies  Review of patient's allergies indicates no known allergies.  Home Medications   Current Outpatient Rx  Name  Route  Sig  Dispense  Refill  . ACYCLOVIR 400 MG PO TABS   Oral   Take 400 mg by mouth every morning.         . ALLOPURINOL 300 MG PO TABS   Oral   Take 300 mg by mouth daily.         . ASPIRIN EC 325 MG PO TBEC   Oral   Take 325 mg by mouth every morning.          Marland Kitchen CARVEDILOL 3.125 MG PO TABS   Oral   Take 1 tablet (3.125 mg total) by mouth 2 (two) times daily.   60 tablet   6   . VITAMIN D 2000 UNITS PO CAPS   Oral   Take 1 capsule by mouth every morning.          Marland Kitchen DEXAMETHASONE 4 MG PO TABS   Oral   Take 5 tablets (20 mg total) by mouth 2 (two) times daily. Every Friday   40 tablet   2   . DEXLANSOPRAZOLE 60 MG PO CPDR   Oral   Take 60 mg by mouth every morning.          Marland Kitchen DIPHENOXYLATE-ATROPINE 2.5-0.025 MG PO TABS   Oral   Take 2 tablets by mouth every morning. Max 8 per day         . INSULIN ASPART 100 UNIT/ML [place] SOLN   Subcutaneous   Inject 5-20 Units into the skin 3 (three) times daily before meals. Sliding scale         . INSULIN GLARGINE 100 UNIT/ML [place] SOLN   Subcutaneous   Inject 60 Units into the skin daily before breakfast.         . METANX PO   Oral   Take 1 tablet by mouth every morning.          Marland Kitchen LENALIDOMIDE 10 MG PO CAPS      Take 1 capsule PO daily for 14 days and then 7 days off (starting on 04/17/2012)   30 capsule   1   . LIDOCAINE-PRILOCAINE 2.5-2.5  % EX CREA   Topical   Apply 1 application topically as needed. To port site. Apply 1 hour prior to treatment. Do not rub in. Cover with plastic.         Marland Kitchen  LORATADINE 10 MG PO TABS   Oral   Take 10 mg by mouth every morning.          Creed Copper M PLUS PO TABS   Oral   Take 1 tablet by mouth every morning.          Marland Kitchen ONDANSETRON HCL 8 MG PO TABS   Oral   Take 8 mg by mouth 2 (two) times daily as needed. Nausea/vomiting. Patient to also take this 30 minutes to 1 hour prior to Velcade injections.         Marland Kitchen PANCRELIPASE (LIP-PROT-AMYL) 36000 UNITS PO CPEP   Oral   Take 36,000-72,000 Units by mouth 3 (three) times daily. Take two with meals and one with snacks         . POTASSIUM CHLORIDE CRYS ER 20 MEQ PO TBCR   Oral   Take 40 mEq by mouth 2 (two) times daily. Take 2 tabs BID         . ROSUVASTATIN CALCIUM 10 MG PO TABS   Oral   Take 10 mg by mouth every morning.          . SULFAMETHOXAZOLE-TRIMETHOPRIM 800-160 MG PO TABS   Oral   Take 1 tablet by mouth every Monday, Wednesday, and Friday. Take 1 tablet, PO, Monday-Wednesday-Friday         . CIALIS PO   Oral   Take by mouth as needed. For intercourse         . TORSEMIDE 20 MG PO TABS   Oral   Take 40 mg by mouth every morning.          Marland Kitchen ZOMETA IV   Intravenous   Inject 2 mg into the vein every 28 (twenty-eight) days. To start Jul 11, 2011         . CIPROFLOXACIN HCL 500 MG PO TABS   Oral   Take 1 tablet (500 mg total) by mouth 2 (two) times daily.   28 tablet   0   . HYDROCODONE-ACETAMINOPHEN 5-325 MG PO TABS   Oral   Take 1-2 tablets by mouth every 6 (six) hours as needed for pain.   20 tablet   0     Triage Vitals: BP 110/79  Pulse 52  Temp 98 F (36.7 C) (Oral)  Resp 16  SpO2 100%  Physical Exam  Nursing note and vitals reviewed. Constitutional: He is oriented to person, place, and time. He appears well-developed and well-nourished. No distress.  HENT:  Head: Normocephalic and  atraumatic.  Eyes: EOM are normal.  Neck: Neck supple. No tracheal deviation present.  Cardiovascular: Normal rate, regular rhythm and normal heart sounds.   No murmur heard. Pulmonary/Chest: Effort normal. No respiratory distress.  Abdominal: Soft. Bowel sounds are normal. He exhibits no distension. There is no tenderness. There is no rebound and no guarding.  Genitourinary:       Left testicle is non-tender, right testicle is swollen, enlarged/tender. Scrotum with mild swelling, non erythematous  Musculoskeletal: Normal range of motion.  Neurological: He is alert and oriented to person, place, and time.  Skin: Skin is warm and dry.  Psychiatric: He has a normal mood and affect. His behavior is normal.    ED Course  Procedures (including critical care time) DIAGNOSTIC STUDIES: Oxygen Saturation is 100% on room air, normal by my interpretation.    COORDINATION OF CARE: At 355 PM Discussed treatment plan with patient which includes testicle US, IV fluids, nausea/pain medicine, UA, blood  work. Patient agrees.   Labs Reviewed  URINALYSIS, ROUTINE W REFLEX MICROSCOPIC - Abnormal; Notable for the following:    APPearance HAZY (*)     Hgb urine dipstick MODERATE (*)     Protein, ur 100 (*)     Leukocytes, UA MODERATE (*)     All other components within normal limits  URINE MICROSCOPIC-ADD ON - Abnormal; Notable for the following:    Bacteria, UA MANY (*)     Casts HYALINE CASTS (*)     All other components within normal limits  URINE CULTURE   US Scrotum  05/06/2012  *RADIOLOGY REPORT*  Clinical Data:  Testicular pain.  Groin pain.  History of scrotal abscess per patient.  SCROTAL ULTRASOUND DOPPLER ULTRASOUND OF THE TESTICLES  Technique: Complete ultrasound examination of the testicles, epididymis, and other scrotal structures was performed.  Color and spectral Doppler ultrasound were also utilized to evaluate blood flow to the testicles.  Comparison:  None  Findings:  Right testis:   2.7 x 1.9 x 2.8 cm.  No focal mass identified.  Left testis:  4.0 x 2.0 x 2.6 cm.  No focal mass identified.  Right epididymis:  Epididymal cyst/spermatocele measures 7 x 6 x 7 mm.  There is increased flow within the right epididymis.  Left epididymis:  A small cyst/spermatocele measures 4 x 5 x 6 mm.  Hydrocele:  Moderate right hydrocele.  Varicocele:  No varicocele.  Pulsed Doppler interrogation of both testes demonstrates blood flow within the right testicle is greater than that seen on the left. Arterial and venous waveforms are documented bilaterally.  IMPRESSION:  1.  No evidence for testicular torsion or mass. 2.  Right-sided hydrocele and increased vascularity in the testis and epididymis.  Findings are consistent with epididymo-orchitis.   Original Report Authenticated By: Nolon Nations, M.D.    [place] Art/ven Flow Abd Pelv Doppler  05/06/2012  *RADIOLOGY REPORT*  Clinical Data:  Testicular pain.  Groin pain.  History of scrotal abscess per patient.  SCROTAL ULTRASOUND DOPPLER ULTRASOUND OF THE TESTICLES  Technique: Complete ultrasound examination of the testicles, epididymis, and other scrotal structures was performed.  Color and spectral Doppler ultrasound were also utilized to evaluate blood flow to the testicles.  Comparison:  None  Findings:  Right testis:  2.7 x 1.9 x 2.8 cm.  No focal mass identified.  Left testis:  4.0 x 2.0 x 2.6 cm.  No focal mass identified.  Right epididymis:  Epididymal cyst/spermatocele measures 7 x 6 x 7 mm.  There is increased flow within the right epididymis.  Left epididymis:  A small cyst/spermatocele measures 4 x 5 x 6 mm.  Hydrocele:  Moderate right hydrocele.  Varicocele:  No varicocele.  Pulsed Doppler interrogation of both testes demonstrates blood flow within the right testicle is greater than that seen on the left. Arterial and venous waveforms are documented bilaterally.  IMPRESSION:  1.  No evidence for testicular torsion or mass. 2.  Right-sided hydrocele and  increased vascularity in the testis and epididymis.  Findings are consistent with epididymo-orchitis.   Original Report Authenticated By: Nolon Nations, M.D.    Results for orders placed during the hospital encounter of 05/06/12  URINALYSIS, ROUTINE W REFLEX MICROSCOPIC      Component Value Range   Color, Urine YELLOW  YELLOW   APPearance HAZY (*) CLEAR   Specific Gravity, Urine 1.020  1.005 - 1.030   pH 5.5  5.0 - 8.0   Glucose, UA NEGATIVE  NEGATIVE  mg/dL   Hgb urine dipstick MODERATE (*) NEGATIVE   Bilirubin Urine NEGATIVE  NEGATIVE   Ketones, ur NEGATIVE  NEGATIVE mg/dL   Protein, ur 100 (*) NEGATIVE mg/dL   Urobilinogen, UA 0.2  0.0 - 1.0 mg/dL   Nitrite NEGATIVE  NEGATIVE   Leukocytes, UA MODERATE (*) NEGATIVE  URINE MICROSCOPIC-ADD ON      Component Value Range   Squamous Epithelial / LPF RARE  RARE   WBC, UA TOO NUMEROUS TO COUNT  <3 WBC/hpf   RBC / HPF 0-2  <3 RBC/hpf   Bacteria, UA MANY (*) RARE   Casts HYALINE CASTS (*) NEGATIVE   Urine-Other MUCOUS PRESENT     US Scrotum  05/06/2012  *RADIOLOGY REPORT*  Clinical Data:  Testicular pain.  Groin pain.  History of scrotal abscess per patient.  SCROTAL ULTRASOUND DOPPLER ULTRASOUND OF THE TESTICLES  Technique: Complete ultrasound examination of the testicles, epididymis, and other scrotal structures was performed.  Color and spectral Doppler ultrasound were also utilized to evaluate blood flow to the testicles.  Comparison:  None  Findings:  Right testis:  2.7 x 1.9 x 2.8 cm.  No focal mass identified.  Left testis:  4.0 x 2.0 x 2.6 cm.  No focal mass identified.  Right epididymis:  Epididymal cyst/spermatocele measures 7 x 6 x 7 mm.  There is increased flow within the right epididymis.  Left epididymis:  A small cyst/spermatocele measures 4 x 5 x 6 mm.  Hydrocele:  Moderate right hydrocele.  Varicocele:  No varicocele.  Pulsed Doppler interrogation of both testes demonstrates blood flow within the right testicle is greater than  that seen on the left. Arterial and venous waveforms are documented bilaterally.  IMPRESSION:  1.  No evidence for testicular torsion or mass. 2.  Right-sided hydrocele and increased vascularity in the testis and epididymis.  Findings are consistent with epididymo-orchitis.   Original Report Authenticated By: Nolon Nations, M.D.    [place] Art/ven Flow Abd Pelv Doppler  05/06/2012  *RADIOLOGY REPORT*  Clinical Data:  Testicular pain.  Groin pain.  History of scrotal abscess per patient.  SCROTAL ULTRASOUND DOPPLER ULTRASOUND OF THE TESTICLES  Technique: Complete ultrasound examination of the testicles, epididymis, and other scrotal structures was performed.  Color and spectral Doppler ultrasound were also utilized to evaluate blood flow to the testicles.  Comparison:  None  Findings:  Right testis:  2.7 x 1.9 x 2.8 cm.  No focal mass identified.  Left testis:  4.0 x 2.0 x 2.6 cm.  No focal mass identified.  Right epididymis:  Epididymal cyst/spermatocele measures 7 x 6 x 7 mm.  There is increased flow within the right epididymis.  Left epididymis:  A small cyst/spermatocele measures 4 x 5 x 6 mm.  Hydrocele:  Moderate right hydrocele.  Varicocele:  No varicocele.  Pulsed Doppler interrogation of both testes demonstrates blood flow within the right testicle is greater than that seen on the left. Arterial and venous waveforms are documented bilaterally.  IMPRESSION:  1.  No evidence for testicular torsion or mass. 2.  Right-sided hydrocele and increased vascularity in the testis and epididymis.  Findings are consistent with epididymo-orchitis.   Original Report Authenticated By: Nolon Nations, M.D.     Results for orders placed during the hospital encounter of 05/06/12  URINALYSIS, ROUTINE W REFLEX MICROSCOPIC      Component Value Range   Color, Urine YELLOW  YELLOW   APPearance HAZY (*) CLEAR   Specific Gravity, Urine 1.020  1.005 - 1.030   pH 5.5  5.0 - 8.0   Glucose, UA NEGATIVE  NEGATIVE mg/dL   Hgb  urine dipstick MODERATE (*) NEGATIVE   Bilirubin Urine NEGATIVE  NEGATIVE   Ketones, ur NEGATIVE  NEGATIVE mg/dL   Protein, ur 100 (*) NEGATIVE mg/dL   Urobilinogen, UA 0.2  0.0 - 1.0 mg/dL   Nitrite NEGATIVE  NEGATIVE   Leukocytes, UA MODERATE (*) NEGATIVE  URINE MICROSCOPIC-ADD ON      Component Value Range   Squamous Epithelial / LPF RARE  RARE   WBC, UA TOO NUMEROUS TO COUNT  <3 WBC/hpf   RBC / HPF 0-2  <3 RBC/hpf   Bacteria, UA MANY (*) RARE   Casts HYALINE CASTS (*) NEGATIVE   Urine-Other MUCOUS PRESENT       1. Epididymitis       MDM  Clinical finding symptoms urinalysis and ultrasound all consistent with right-sided epididymitis. No evidence of significant abscess. Patient received the IV piggyback Cipro 4 mg in the department and will continue with Cipro for the next 14 days at home orally. Urine culture is pending. Patient will return if not improved in the next 2-3 days.      I personally performed the services described in this documentation, which was scribed in my presence. The recorded information has been reviewed and is accurate.           Mervin Kung, MD 05/06/12 [phone]

## 2012-05-06 NOTE — ED Notes (Signed)
No adverse reaction to IV antibiotic therapy.

## 2012-05-06 NOTE — Progress Notes (Signed)
[name] presented for labwork. Labs per MD order drawn via Peripheral Line 23 gauge needle inserted in lt ac  Good blood return present. Procedure without incident.  Needle removed intact. Patient tolerated procedure well.

## 2012-05-06 NOTE — ED Notes (Signed)
Pt c/o swelling and pain in right testicle since Sunday. Pt was seen by another MD and told to come to ED for evaluation.

## 2012-05-07 ENCOUNTER — Other Ambulatory Visit (HOSPITAL_COMMUNITY): Payer: PRIVATE HEALTH INSURANCE

## 2012-05-07 ENCOUNTER — Encounter (HOSPITAL_BASED_OUTPATIENT_CLINIC_OR_DEPARTMENT_OTHER): Payer: PRIVATE HEALTH INSURANCE

## 2012-05-07 VITALS — BP 120/76 | HR 89 | Temp 98.4°F | Resp 20

## 2012-05-07 DIAGNOSIS — C9000 Multiple myeloma not having achieved remission: Secondary | ICD-10-CM

## 2012-05-07 MED ORDER — SODIUM CHLORIDE 0.9 % IJ SOLN
10.0000 mL | INTRAMUSCULAR | Status: DC | PRN
  Administered 2012-05-07: 10 mL via INTRAVENOUS
  Filled 2012-05-07: qty 10

## 2012-05-07 MED ORDER — SODIUM CHLORIDE 0.9 % IJ SOLN
3.0000 mL | Freq: Once | INTRAMUSCULAR | Status: DC | PRN
  Filled 2012-05-07: qty 10

## 2012-05-07 MED ORDER — ZOLEDRONIC ACID 4 MG/5ML IV CONC
2.0000 mg | Freq: Once | INTRAVENOUS | Status: AC
  Administered 2012-05-07: 2 mg via INTRAVENOUS
  Filled 2012-05-07: qty 2.5

## 2012-05-07 MED ORDER — HEPARIN SOD (PORK) LOCK FLUSH 100 UNIT/ML IV SOLN
250.0000 [IU] | Freq: Once | INTRAVENOUS | Status: AC | PRN
  Administered 2012-05-07: 16:00:00
  Filled 2012-05-07: qty 5

## 2012-05-07 NOTE — Progress Notes (Signed)
Tolerated zometa well.

## 2012-05-09 LAB — URINE CULTURE: Colony Count: >=100000

## 2012-05-10 LAB — KAPPA/LAMBDA LIGHT CHAINS
Kappa free light chain: 3.02 mg/dL — ABNORMAL HIGH (ref 0.33–1.94)
Kappa, lambda light chain ratio: 1.29 (ref 0.26–1.65)
Lambda free light chains: 2.35 mg/dL (ref 0.57–2.63)

## 2012-05-10 NOTE — ED Notes (Signed)
+   Urine Patient treated with Cipro-resistant to same-Chart sent to [facility] office for review.

## 2012-05-11 LAB — MULTIPLE MYELOMA PANEL, SERUM
Albumin ELP: 50.8 % — ABNORMAL LOW (ref 55.8–66.1)
Alpha-1-Globulin: 11.8 % — ABNORMAL HIGH (ref 2.9–4.9)
Alpha-2-Globulin: 16 % — ABNORMAL HIGH (ref 7.1–11.8)
Beta 2: 6.7 % — ABNORMAL HIGH (ref 3.2–6.5)
Beta Globulin: 7.1 % (ref 4.7–7.2)
Gamma Globulin: 7.6 % — ABNORMAL LOW (ref 11.1–18.8)
IgA: 88 mg/dL (ref 68–379)
IgG (Immunoglobin G), Serum: 470 mg/dL — ABNORMAL LOW (ref 650–1600)
IgM, Serum: 94 mg/dL (ref 41–251)
M-Spike, %: NOT DETECTED g/dL
Total Protein: 5.7 g/dL — ABNORMAL LOW (ref 6.0–8.3)

## 2012-05-12 ENCOUNTER — Ambulatory Visit: Payer: PRIVATE HEALTH INSURANCE | Admitting: Orthopedic Surgery

## 2012-05-12 ENCOUNTER — Other Ambulatory Visit: Payer: Self-pay | Admitting: Adult Health

## 2012-05-12 ENCOUNTER — Other Ambulatory Visit (HOSPITAL_COMMUNITY): Payer: Self-pay | Admitting: Oncology

## 2012-05-12 DIAGNOSIS — C9000 Multiple myeloma not having achieved remission: Secondary | ICD-10-CM

## 2012-05-12 MED ORDER — ALLOPURINOL 300 MG PO TABS: 300.0000 mg | ORAL_TABLET | Freq: Every day | ORAL | Status: DC

## 2012-05-13 ENCOUNTER — Telehealth (HOSPITAL_COMMUNITY): Payer: Self-pay | Admitting: Oncology

## 2012-05-13 NOTE — Telephone Encounter (Signed)
Faxed requested records to [facility]

## 2012-05-28 ENCOUNTER — Other Ambulatory Visit (HOSPITAL_COMMUNITY): Payer: Self-pay | Admitting: Oncology

## 2012-05-28 DIAGNOSIS — C9000 Multiple myeloma not having achieved remission: Secondary | ICD-10-CM

## 2012-05-28 MED ORDER — LENALIDOMIDE 10 MG PO CAPS: ORAL_CAPSULE | ORAL | Status: DC

## 2012-05-31 ENCOUNTER — Encounter (INDEPENDENT_AMBULATORY_CARE_PROVIDER_SITE_OTHER): Payer: Self-pay | Admitting: Internal Medicine

## 2012-05-31 ENCOUNTER — Other Ambulatory Visit (HOSPITAL_COMMUNITY): Payer: Self-pay | Admitting: Oncology

## 2012-05-31 ENCOUNTER — Ambulatory Visit (INDEPENDENT_AMBULATORY_CARE_PROVIDER_SITE_OTHER): Payer: PRIVATE HEALTH INSURANCE | Admitting: Internal Medicine

## 2012-05-31 VITALS — BP 112/70 | HR 74 | Temp 97.1°F | Resp 18 | Ht 75.0 in | Wt 336.9 lb

## 2012-05-31 DIAGNOSIS — R600 Localized edema: Secondary | ICD-10-CM

## 2012-05-31 DIAGNOSIS — R7989 Other specified abnormal findings of blood chemistry: Secondary | ICD-10-CM

## 2012-05-31 DIAGNOSIS — R609 Edema, unspecified: Secondary | ICD-10-CM

## 2012-05-31 DIAGNOSIS — R197 Diarrhea, unspecified: Secondary | ICD-10-CM

## 2012-05-31 DIAGNOSIS — C9000 Multiple myeloma not having achieved remission: Secondary | ICD-10-CM

## 2012-05-31 MED ORDER — TORSEMIDE 20 MG PO TABS: 20.0000 mg | ORAL_TABLET | Freq: Every morning | ORAL | Status: DC

## 2012-05-31 MED ORDER — LENALIDOMIDE 10 MG PO CAPS: ORAL_CAPSULE | ORAL | Status: DC

## 2012-05-31 MED ORDER — POTASSIUM CHLORIDE CRYS ER 20 MEQ PO TBCR: 40.0000 meq | EXTENDED_RELEASE_TABLET | Freq: Every day | ORAL | Status: DC

## 2012-05-31 NOTE — Patient Instructions (Signed)
Check weight once a week; call if you gained more than 5 pounds. Physician will contact you with results of stool test. Demadex does decrease to 20 mg every morning and potassium chloride dose decreased to 40 mEq every morning.

## 2012-05-31 NOTE — Progress Notes (Addendum)
Presenting complaint;  Followup for steatorrhea/pancreatic insufficiency.  Subjective:  Patient is 51 year old [place] American male who is here for scheduled visit accompanied by his wife [name]. He was last seen on 03/22/2012 and was doing well. He was begun on [name] one month ago and his diarrhea is back. He is having 2-3 explosive bowel movements per day. His stool is loose and watery. He denies melena or rectal bleeding. He has excessive flatulence but nothing like before. He has not experienced epigastric and right upper quadrant pain since the toes I was discontinued by Dr. [name]. Earlier this month he was diagnosed with epididymitis and treated with Cipro and he is back on Cipro again across his condition had not completely resolved. He has lost 23 pounds in the last 10 weeks. All in all he has lost 93 pounds since November 2012 when multiple myeloma was diagnosed. He states he has very good appetite but trying to control his intake in order to lose weight. He denies nausea or vomiting. He also denies fever chills or diaphoresis.  Current Medications: Current Outpatient Prescriptions  Medication Sig Dispense Refill  . acyclovir (ZOVIRAX) 400 MG tablet Take 400 mg by mouth every morning.      Marland Kitchen allopurinol (ZYLOPRIM) 300 MG tablet Take 1 tablet (300 mg total) by mouth daily.  30 tablet  2  . aspirin EC 325 MG tablet Take 325 mg by mouth every morning.       . Cholecalciferol (VITAMIN D) 2000 UNITS CAPS Take 1 capsule by mouth every morning.       . ciprofloxacin (CIPRO) 500 MG tablet Take 1 tablet (500 mg total) by mouth 2 (two) times daily.  28 tablet  0  . COREG 3.125 MG tablet TAKE ONE TABLET BY MOUTH 2 TIMES A DAY.  60 tablet  1  . dexamethasone (DECADRON) 4 MG tablet Take 5 tablets (20 mg total) by mouth 2 (two) times daily. Every Friday  40 tablet  2  . dexlansoprazole (DEXILANT) 60 MG capsule Take 60 mg by mouth every morning.       . diphenoxylate-atropine (LOMOTIL) 2.5-0.025  MG per tablet Take 2 tablets by mouth every morning. Max 8 per day      . insulin aspart (NOVOLOG) 100 UNIT/ML injection Inject 5-20 Units into the skin 3 (three) times daily before meals. Sliding scale      . insulin glargine (LANTUS) 100 UNIT/ML injection Inject 60 Units into the skin daily before breakfast.      . L-Methylfolate-B6-B12 (METANX PO) Take 1 tablet by mouth every morning.       Marland Kitchen lenalidomide ([name]) 10 MG capsule Take 1 capsule PO daily for 14 days and then 7 days off  14 capsule  0  . lidocaine-prilocaine (EMLA) cream Apply 1 application topically as needed. To port site. Apply 1 hour prior to treatment. Do not rub in. Cover with plastic.      Marland Kitchen loratadine (CLARITIN) 10 MG tablet Take 10 mg by mouth every morning.       . Pancrelipase, Lip-Prot-Amyl, 36000 UNITS CPEP Take [id number] Units by mouth 3 (three) times daily. Take two with meals and one with snacks      . potassium chloride SA (K-DUR,KLOR-CON) 20 MEQ tablet Take 40 mEq by mouth 2 (two) times daily. Take 2 tabs BID      . rosuvastatin (CRESTOR) 10 MG tablet Take 10 mg by mouth every morning.       . sulfamethoxazole-trimethoprim (BACTRIM DS,SEPTRA DS)  800-160 MG per tablet Take 1 tablet by mouth every Monday, Wednesday, and Friday. Take 1 tablet, PO, Monday-Wednesday-Friday      . Tadalafil (CIALIS PO) Take by mouth as needed. For intercourse      . torsemide (DEMADEX) 20 MG tablet Take 40 mg by mouth every morning.       . Zoledronic Acid (ZOMETA IV) Inject 2 mg into the vein every 28 (twenty-eight) days. To start Jul 11, 2011      . Multiple Vitamins-Minerals (MULTIVITAMINS THER. W/MINERALS) TABS Take 1 tablet by mouth every morning.       . ondansetron (ZOFRAN) 8 MG tablet Take 8 mg by mouth 2 (two) times daily as needed. Nausea/vomiting. Patient to also take this 30 minutes to 1 hour prior to Velcade injections.         Objective: Blood pressure 112/70, pulse 74, temperature 97.1 F (36.2 C), temperature  source Oral, resp. rate 18, height 6\' 3"  (1.905 m), weight 336 lb 14.4 oz (152.817 kg). Patient is alert and in no acute distress. Conjunctiva is pink. Sclera is nonicteric Oropharyngeal mucosa is normal. No neck masses or thyromegaly noted. Cardiac exam with regular rhythm normal S1 and S2. No murmur or gallop noted. Lungs are clear to auscultation. Abdomen is protuberant. Bowel sounds are normal. Percussion note is tympanitic and epigastric region. Abdomen is soft and nontender without organomegaly or masses.  He has trace edema around ankles.  Labs/studies Results: Lab data from 05/06/2012(ER). WBC 14.7, H&H 11.7 and 34.8 and platelet count 170K. Electrolytes normal but BUN was 27 and creatinine 2.05 and albumin 2.8. Creatinine was 1.66 on 04/09/2012.     Assessment: #1. Steatorrhea. He was diagnosed with pancreatic insufficiency and had been doing well with pancreatic enzyme replacement. He is now having diarrhea and explosive bowel movement since [name] was resumed. Therefore he must have additional mechanism to account for diarrhea. Doubt that he has bacterial overgrowth since he has not improved with Cipro which he is taking for epididymitis. It is possible that diarrhea is secondary to toxic effect of [name] on small bowel mucosa. If this medication is crucial we have to treat him symptomatically. Since he has been on Cipro for the second time this month need to rule out C. Difficile colitis. #2. Azotemia. Suspect he is mildly dehydrated and diuretic therapy needs to titrated. #3. Weight loss felt to be multifactorial.    Plan:  Stool for C. difficile toxin titer. Continue pancreatic enzyme supplement as before. Continue diphenoxylate 2 tablets by mouth every morning. Will discuss with Dr. [name] if there are other alternatives for maintenance therapy for multiple myeloma. Decrease Demadex to 20 mg by mouth every morning. Decrease KCl to 40 mg by mouth  daily. Patient will call if he has gained more than 5 pounds. He will have metabolic 7 in two weeks at the time of office visit with Dr. [name]. Office visit in 8 weeks.

## 2012-06-03 ENCOUNTER — Telehealth (INDEPENDENT_AMBULATORY_CARE_PROVIDER_SITE_OTHER): Payer: Self-pay | Admitting: *Deleted

## 2012-06-03 DIAGNOSIS — R7989 Other specified abnormal findings of blood chemistry: Secondary | ICD-10-CM

## 2012-06-03 DIAGNOSIS — R197 Diarrhea, unspecified: Secondary | ICD-10-CM

## 2012-06-03 DIAGNOSIS — R609 Edema, unspecified: Secondary | ICD-10-CM

## 2012-06-03 NOTE — Telephone Encounter (Signed)
Needed to reorder wrong test code

## 2012-06-03 NOTE — Telephone Encounter (Signed)
Patient stated at his office visit on 05/31/12 that he was to have lab work done at clinic 1st week of January. Dr.Rehman is requesting that Met 7 be drawn at this time. Order in [place]. I forwarded this to [facility] in the [place]

## 2012-06-04 LAB — CLOSTRIDIUM DIFFICILE EIA: CDIFTX: NEGATIVE

## 2012-06-07 ENCOUNTER — Encounter (HOSPITAL_BASED_OUTPATIENT_CLINIC_OR_DEPARTMENT_OTHER): Payer: 59

## 2012-06-07 ENCOUNTER — Encounter (HOSPITAL_COMMUNITY): Payer: 59 | Attending: Oncology

## 2012-06-07 DIAGNOSIS — C9000 Multiple myeloma not having achieved remission: Secondary | ICD-10-CM | POA: Insufficient documentation

## 2012-06-07 DIAGNOSIS — X58XXXA Exposure to other specified factors, initial encounter: Secondary | ICD-10-CM | POA: Insufficient documentation

## 2012-06-07 DIAGNOSIS — N453 Epididymo-orchitis: Secondary | ICD-10-CM | POA: Insufficient documentation

## 2012-06-07 LAB — COMPREHENSIVE METABOLIC PANEL
ALT: 87 U/L — ABNORMAL HIGH (ref 0–53)
AST: 95 U/L — ABNORMAL HIGH (ref 0–37)
Albumin: 3.2 g/dL — ABNORMAL LOW (ref 3.5–5.2)
Alkaline Phosphatase: 79 U/L (ref 39–117)
BUN: 25 mg/dL — ABNORMAL HIGH (ref 6–23)
CO2: 20 mEq/L (ref 19–32)
Calcium: 8 mg/dL — ABNORMAL LOW (ref 8.4–10.5)
Chloride: 112 mEq/L (ref 96–112)
Creatinine, Ser: 1.24 mg/dL (ref 0.50–1.35)
GFR calc Af Amer: 75 mL/min — ABNORMAL LOW (ref >90)
GFR calc non Af Amer: 65 mL/min — ABNORMAL LOW (ref >90)
Glucose, Bld: 92 mg/dL (ref 70–99)
Potassium: 3.5 mEq/L (ref 3.5–5.1)
Sodium: 142 mEq/L (ref 135–145)
Total Bilirubin: 0.3 mg/dL (ref 0.3–1.2)
Total Protein: 6 g/dL (ref 6.0–8.3)

## 2012-06-07 LAB — CBC WITH DIFFERENTIAL/PLATELET
Basophils Absolute: 0 10*3/uL (ref 0.0–0.1)
Basophils Relative: 0 % (ref 0–1)
Eosinophils Absolute: 0.4 10*3/uL (ref 0.0–0.7)
Eosinophils Relative: 4 % (ref 0–5)
HCT: 30 % — ABNORMAL LOW (ref 39.0–52.0)
Hemoglobin: 10.1 g/dL — ABNORMAL LOW (ref 13.0–17.0)
Lymphocytes Relative: 25 % (ref 12–46)
Lymphs Abs: 2.4 10*3/uL (ref 0.7–4.0)
MCH: 30.1 pg (ref 26.0–34.0)
MCHC: 33.7 g/dL (ref 30.0–36.0)
MCV: 89.6 fL (ref 78.0–100.0)
Monocytes Absolute: 2 10*3/uL — ABNORMAL HIGH (ref 0.1–1.0)
Monocytes Relative: 21 % — ABNORMAL HIGH (ref 3–12)
Neutro Abs: 4.6 10*3/uL (ref 1.7–7.7)
Neutrophils Relative %: 49 % (ref 43–77)
Platelets: 184 10*3/uL (ref 150–400)
RBC: 3.35 MIL/uL — ABNORMAL LOW (ref 4.22–5.81)
RDW: 16.6 % — ABNORMAL HIGH (ref 11.5–15.5)
WBC: 9.3 10*3/uL (ref 4.0–10.5)

## 2012-06-07 LAB — LACTATE DEHYDROGENASE: LDH: 305 U/L — ABNORMAL HIGH (ref 94–250)

## 2012-06-07 MED ORDER — HEPARIN SOD (PORK) LOCK FLUSH 100 UNIT/ML IV SOLN
INTRAVENOUS | Status: AC
  Filled 2012-06-07: qty 5

## 2012-06-07 MED ORDER — SODIUM CHLORIDE 0.9 % IV SOLN
INTRAVENOUS | Status: DC
  Administered 2012-06-07: 17:00:00 via INTRAVENOUS

## 2012-06-07 MED ORDER — SODIUM CHLORIDE 0.9 % IV SOLN
2.0000 mg | Freq: Once | INTRAVENOUS | Status: AC
  Administered 2012-06-07: 2 mg via INTRAVENOUS
  Filled 2012-06-07: qty 2.5

## 2012-06-07 MED ORDER — HEPARIN SOD (PORK) LOCK FLUSH 100 UNIT/ML IV SOLN
500.0000 [IU] | Freq: Once | INTRAVENOUS | Status: AC
  Administered 2012-06-07: 500 [IU] via INTRAVENOUS
  Filled 2012-06-07: qty 5

## 2012-06-07 MED ORDER — ALTEPLASE 2 MG IJ SOLR
2.0000 mg | Freq: Once | INTRAMUSCULAR | Status: DC | PRN
  Filled 2012-06-07: qty 2

## 2012-06-08 ENCOUNTER — Other Ambulatory Visit (HOSPITAL_COMMUNITY): Payer: Self-pay | Admitting: Oncology

## 2012-06-08 DIAGNOSIS — C9000 Multiple myeloma not having achieved remission: Secondary | ICD-10-CM

## 2012-06-08 LAB — KAPPA/LAMBDA LIGHT CHAINS
Kappa free light chain: 1.97 mg/dL — ABNORMAL HIGH (ref 0.33–1.94)
Kappa, lambda light chain ratio: 0.98 (ref 0.26–1.65)
Lambda free light chains: 2.01 mg/dL (ref 0.57–2.63)

## 2012-06-08 MED ORDER — SULFAMETHOXAZOLE-TRIMETHOPRIM 800-160 MG PO TABS: 1.0000 | ORAL_TABLET | ORAL | Status: DC

## 2012-06-09 ENCOUNTER — Other Ambulatory Visit (HOSPITAL_COMMUNITY): Payer: Self-pay | Admitting: Urology

## 2012-06-09 DIAGNOSIS — N451 Epididymitis: Secondary | ICD-10-CM

## 2012-06-09 LAB — MULTIPLE MYELOMA PANEL, SERUM
Albumin ELP: 57.9 % (ref 55.8–66.1)
Alpha-1-Globulin: 7.3 % — ABNORMAL HIGH (ref 2.9–4.9)
Alpha-2-Globulin: 14.8 % — ABNORMAL HIGH (ref 7.1–11.8)
Beta 2: 5 % (ref 3.2–6.5)
Beta Globulin: 6.6 % (ref 4.7–7.2)
Gamma Globulin: 8.4 % — ABNORMAL LOW (ref 11.1–18.8)
IgA: 85 mg/dL (ref 68–379)
IgG (Immunoglobin G), Serum: 493 mg/dL — ABNORMAL LOW (ref 650–1600)
IgM, Serum: 68 mg/dL (ref 41–251)
M-Spike, %: NOT DETECTED g/dL
Total Protein: 5.5 g/dL — ABNORMAL LOW (ref 6.0–8.3)

## 2012-06-10 ENCOUNTER — Other Ambulatory Visit (HOSPITAL_COMMUNITY): Payer: Self-pay | Admitting: Urology

## 2012-06-10 ENCOUNTER — Ambulatory Visit (HOSPITAL_COMMUNITY)
Admission: RE | Admit: 2012-06-10 | Discharge: 2012-06-10 | Disposition: A | Discharge status: Home / Self Care | Payer: 59 | Source: Ambulatory Visit | Attending: Urology | Admitting: Urology

## 2012-06-10 DIAGNOSIS — N451 Epididymitis: Secondary | ICD-10-CM

## 2012-06-10 DIAGNOSIS — N509 Disorder of male genital organs, unspecified: Secondary | ICD-10-CM | POA: Insufficient documentation

## 2012-06-10 DIAGNOSIS — N453 Epididymo-orchitis: Secondary | ICD-10-CM | POA: Insufficient documentation

## 2012-06-10 DIAGNOSIS — N433 Hydrocele, unspecified: Secondary | ICD-10-CM | POA: Insufficient documentation

## 2012-06-10 DIAGNOSIS — R9389 Abnormal findings on diagnostic imaging of other specified body structures: Secondary | ICD-10-CM | POA: Insufficient documentation

## 2012-06-10 LAB — PROTEIN ELECTROPHORESIS, SERUM
Albumin ELP: 57.7 % (ref 55.8–66.1)
Alpha-1-Globulin: 7.5 % — ABNORMAL HIGH (ref 2.9–4.9)
Alpha-2-Globulin: 14.4 % — ABNORMAL HIGH (ref 7.1–11.8)
Beta 2: 5.4 % (ref 3.2–6.5)
Beta Globulin: 6.3 % (ref 4.7–7.2)
Gamma Globulin: 8.7 % — ABNORMAL LOW (ref 11.1–18.8)
M-Spike, %: NOT DETECTED g/dL
Total Protein ELP: 5.5 g/dL — ABNORMAL LOW (ref 6.0–8.3)

## 2012-06-10 LAB — IMMUNOFIXATION ELECTROPHORESIS
IgA: 91 mg/dL (ref 68–379)
IgG (Immunoglobin G), Serum: 535 mg/dL — ABNORMAL LOW (ref 650–1600)
IgM, Serum: 71 mg/dL (ref 41–251)
Total Protein ELP: 5.6 g/dL — ABNORMAL LOW (ref 6.0–8.3)

## 2012-06-15 ENCOUNTER — Encounter (HOSPITAL_BASED_OUTPATIENT_CLINIC_OR_DEPARTMENT_OTHER): Payer: 59 | Admitting: Oncology

## 2012-06-15 ENCOUNTER — Encounter: Payer: Self-pay | Admitting: Oncology

## 2012-06-15 ENCOUNTER — Telehealth (HOSPITAL_COMMUNITY): Payer: Self-pay | Admitting: Oncology

## 2012-06-15 VITALS — BP 107/68 | HR 83 | Temp 97.3°F | Resp 20 | Wt 348.1 lb

## 2012-06-15 DIAGNOSIS — C9000 Multiple myeloma not having achieved remission: Secondary | ICD-10-CM

## 2012-06-15 DIAGNOSIS — N453 Epididymo-orchitis: Secondary | ICD-10-CM

## 2012-06-15 DIAGNOSIS — D839 Common variable immunodeficiency, unspecified: Secondary | ICD-10-CM

## 2012-06-15 DIAGNOSIS — N451 Epididymitis: Secondary | ICD-10-CM

## 2012-06-15 MED ORDER — IMMUNE GLOBULIN (HUMAN) 5 GM/100ML IV SOLN: 1.0000 g/kg | INTRAVENOUS | Status: DC

## 2012-06-15 MED ORDER — LENALIDOMIDE 5 MG PO CAPS: 5.0000 mg | ORAL_CAPSULE | Freq: Every day | ORAL | Status: DC

## 2012-06-15 NOTE — Progress Notes (Signed)
Problem #1 multiple myeloma, IgA type. Presently he is in remission. We're trying to maintain him on weekly dexamethasone and [name] though he has developed [name] toxicity which is significant in the form of GI intolerance with diarrhea which is voluminous. He was also found to have mild pancreatic insufficiency and is being replaced with oral pancreatic enzymes. Once he restarted the [name] however the diarrhea of restarted as well. He was taking 10 mg for 14 days on 7 days off in the hopes that he would tolerate this. He has not been able to do that so her goal in the future is once his epididymitis has resolved to try [name] 5 mg 7 days on and 7 days off and then repeat. Presently we must give him IVIG since he is now on his third round of antibiotics since epididymitis started. He of course is hypogammaglobulinemic secondary to the myeloma. His exam today does not reveal a fever but he has a hot swollen very tender warm scrotum bilaterally sensitive to palpation. Problem #2 morbid obesity presently weighing 348 pounds Problem #3 ongoing diarrhea Problem #4 sleep apnea Problem #5 diabetes mellitus under better control Problem #6 history of gout He is here today with his wife to go over the results of his lab work which is excellent except for hypogammaglobulinemia. His potassium is better and he we'll continue that. He is having some contractions of his fingers at times and if that continues we will have to check his magnesium. His calcium is also within the normal range. His exam still shows edema both legs stable. He states he's gained 9 pounds since his diuretic was decreased by Dr. Laural Golden Nevertheless his scrotum is extremely swollen tender high very sensitive consistent with epididymitis.  He needs IVIG and we will arrange for that tomorrow the next as soon as possible. Otherwise I do not think you'll get rid of this infection since this is his third round of antibiotics. He had 2 rounds  of Cipro and now is on Levaquin. We will not start the [name] until this is completely finished getting better. Then we will do monthly blood work and try the [name] 5 mg 7 days on 7 days off with the ongoing dexamethasone. The dexamethasone of course is 20 mg twice a day one day per week

## 2012-06-15 NOTE — Telephone Encounter (Signed)
[employer] [id number] ? EFFT DT OF POLICY AND IF IVIG NEEDS AUTH. [id number] [place] PER NATALIE POLICY BECAME EFFT ON [id number] AND IVIG DOES NOT REQUIRE AUTH.  CALL [phone]

## 2012-06-15 NOTE — Patient Instructions (Addendum)
[place] Discharge Instructions  RECOMMENDATIONS MADE BY THE CONSULTANT AND ANY TEST RESULTS WILL BE SENT TO YOUR REFERRING PHYSICIAN.  EXAM FINDINGS BY THE PHYSICIAN TODAY AND SIGNS OR SYMPTOMS TO REPORT TO CLINIC OR PRIMARY PHYSICIAN: exam and discussion by MD.  Need to give you IVIG to help boost your immune system.  MEDICATIONS PRESCRIBED:  none  INSTRUCTIONS GIVEN AND DISCUSSED: Report fevers, recurring infections, etc.  SPECIAL INSTRUCTIONS/FOLLOW-UP: Thursday and Friday for IVIG and in 5 - 6 weeks to be seen in follow-up.  Thank you for choosing [place] to provide your oncology and hematology care.  To afford each patient quality time with our providers, please arrive at least 15 minutes before your scheduled appointment time.  With your help, our goal is to use those 15 minutes to complete the necessary work-up to ensure our physicians have the information they need to help with your evaluation and healthcare recommendations.    Effective January 1st, 2014, we ask that you re-schedule your appointment with our physicians should you arrive 10 or more minutes late for your appointment.  We strive to give you quality time with our providers, and arriving late affects you and other patients whose appointments are after yours.    Again, thank you for choosing [facility] [place].  Our hope is that these requests will decrease the amount of time that you wait before being seen by our physicians.       _____________________________________________________________  Should you have questions after your visit to [facility], please contact our office at [phone] between the hours of 8:30 a.m. and 5:00 p.m.  Voicemails left after 4:30 p.m. will not be returned until the following business day.  For prescription refill requests, have your pharmacy contact our office with your prescription refill request.

## 2012-06-16 ENCOUNTER — Other Ambulatory Visit (HOSPITAL_COMMUNITY): Payer: Self-pay

## 2012-06-16 DIAGNOSIS — C9000 Multiple myeloma not having achieved remission: Secondary | ICD-10-CM

## 2012-06-16 MED ORDER — LEVOFLOXACIN 500 MG PO TABS: 500.0000 mg | ORAL_TABLET | Freq: Every day | ORAL | Status: DC

## 2012-06-16 MED ORDER — OXYCODONE-ACETAMINOPHEN 7.5-325 MG PO TABS: 1.0000 | ORAL_TABLET | Freq: Four times a day (QID) | ORAL | Status: DC | PRN

## 2012-06-17 ENCOUNTER — Ambulatory Visit (HOSPITAL_COMMUNITY): Payer: Managed Care, Other (non HMO)

## 2012-06-17 ENCOUNTER — Telehealth (INDEPENDENT_AMBULATORY_CARE_PROVIDER_SITE_OTHER): Payer: Self-pay | Admitting: *Deleted

## 2012-06-17 NOTE — Telephone Encounter (Signed)
Lukah would like to know if Dr. Laural Golden wanted him to increase the Potassium and Torsemide due to fluid gain? The instructions given were the same as he is taking now. Patient's return phone number is [phone].

## 2012-06-18 ENCOUNTER — Encounter (HOSPITAL_BASED_OUTPATIENT_CLINIC_OR_DEPARTMENT_OTHER): Payer: 59

## 2012-06-18 ENCOUNTER — Telehealth (HOSPITAL_COMMUNITY): Payer: Self-pay | Admitting: Oncology

## 2012-06-18 ENCOUNTER — Ambulatory Visit (HOSPITAL_COMMUNITY): Payer: Managed Care, Other (non HMO)

## 2012-06-18 VITALS — BP 135/71 | HR 91 | Temp 97.9°F | Resp 20

## 2012-06-18 DIAGNOSIS — C9000 Multiple myeloma not having achieved remission: Secondary | ICD-10-CM

## 2012-06-18 DIAGNOSIS — N451 Epididymitis: Secondary | ICD-10-CM

## 2012-06-18 DIAGNOSIS — D839 Common variable immunodeficiency, unspecified: Secondary | ICD-10-CM

## 2012-06-18 DIAGNOSIS — N453 Epididymo-orchitis: Secondary | ICD-10-CM

## 2012-06-18 MED ORDER — IMMUNE GLOBULIN (HUMAN) 5 GM/100ML IV SOLN
75.0000 g | Freq: Once | INTRAVENOUS | Status: AC
  Administered 2012-06-18: 75 g via INTRAVENOUS
  Filled 2012-06-18: qty 100

## 2012-06-18 MED ORDER — DEXTROSE 5 % IV SOLN
INTRAVENOUS | Status: DC
  Administered 2012-06-18: 11:00:00 via INTRAVENOUS

## 2012-06-18 MED ORDER — IMMUNE GLOBULIN (HUMAN) 10 GM/200ML IV SOLN
10.0000 g | Freq: Once | INTRAVENOUS | Status: AC
  Administered 2012-06-18: 10 g via INTRAVENOUS
  Filled 2012-06-18: qty 200

## 2012-06-18 MED ORDER — HEPARIN SOD (PORK) LOCK FLUSH 100 UNIT/ML IV SOLN
INTRAVENOUS | Status: AC
  Filled 2012-06-18: qty 5

## 2012-06-18 NOTE — Telephone Encounter (Signed)
Pts [name] has not been approved for IVIG. I spoke with [name] at [place] and she stated the auth date will begin on the date they recv the request for auth. Talked it over with Dr [name] and he asked that pt come in regardless of pre auth. Pt is in a lot of pain and his health is rapidly declining.

## 2012-06-18 NOTE — Telephone Encounter (Signed)
Patient was called and advised per Dr.Rehman's instructions

## 2012-06-18 NOTE — Progress Notes (Signed)
Tolerated well

## 2012-06-18 NOTE — Telephone Encounter (Signed)
[employer]-[id number] CALLED TO SEE IF PRE AUTH FAX FORM WAS RCVD. PER JENNIFER FAX WAS RECVD.

## 2012-06-21 ENCOUNTER — Ambulatory Visit (INDEPENDENT_AMBULATORY_CARE_PROVIDER_SITE_OTHER): Payer: PRIVATE HEALTH INSURANCE | Admitting: Internal Medicine

## 2012-06-21 ENCOUNTER — Encounter (HOSPITAL_BASED_OUTPATIENT_CLINIC_OR_DEPARTMENT_OTHER): Payer: 59

## 2012-06-21 VITALS — BP 114/72 | HR 69 | Temp 97.5°F | Resp 18

## 2012-06-21 DIAGNOSIS — D839 Common variable immunodeficiency, unspecified: Secondary | ICD-10-CM

## 2012-06-21 DIAGNOSIS — C9000 Multiple myeloma not having achieved remission: Secondary | ICD-10-CM

## 2012-06-21 DIAGNOSIS — N453 Epididymo-orchitis: Secondary | ICD-10-CM

## 2012-06-21 MED ORDER — HEPARIN SOD (PORK) LOCK FLUSH 100 UNIT/ML IV SOLN
INTRAVENOUS | Status: AC
  Filled 2012-06-21: qty 5

## 2012-06-21 MED ORDER — IMMUNE GLOBULIN (HUMAN) 10 GM/200ML IV SOLN
10.0000 g | Freq: Once | INTRAVENOUS | Status: AC
  Administered 2012-06-21: 10 g via INTRAVENOUS
  Filled 2012-06-21: qty 200

## 2012-06-21 MED ORDER — IMMUNE GLOBULIN (HUMAN) 5 GM/100ML IV SOLN
75.0000 g | Freq: Once | INTRAVENOUS | Status: AC
  Administered 2012-06-21: 75 g via INTRAVENOUS
  Filled 2012-06-21: qty 100

## 2012-06-21 MED ORDER — SODIUM CHLORIDE 0.9 % IJ SOLN
10.0000 mL | INTRAMUSCULAR | Status: DC | PRN
  Administered 2012-06-21: 10 mL via INTRAVENOUS
  Filled 2012-06-21: qty 10

## 2012-06-21 MED ORDER — HEPARIN SOD (PORK) LOCK FLUSH 100 UNIT/ML IV SOLN
500.0000 [IU] | Freq: Once | INTRAVENOUS | Status: AC
  Administered 2012-06-21: 500 [IU] via INTRAVENOUS
  Filled 2012-06-21: qty 5

## 2012-06-22 ENCOUNTER — Ambulatory Visit (HOSPITAL_COMMUNITY): Payer: Managed Care, Other (non HMO)

## 2012-06-25 ENCOUNTER — Telehealth (HOSPITAL_COMMUNITY): Payer: Self-pay | Admitting: *Deleted

## 2012-06-25 ENCOUNTER — Encounter: Payer: Self-pay | Admitting: Oncology

## 2012-06-25 NOTE — Telephone Encounter (Signed)
Pt reports that pain has decreased about 50%. Swelling on rt side down about 50%, left side about 25%. He is taking pain med on schedule. Finished antibiotic first of the week.  He also has some swelling in feet. Goes down slightly when elevated over night and his feet hurt.

## 2012-06-29 ENCOUNTER — Telehealth (HOSPITAL_COMMUNITY): Payer: Self-pay | Admitting: *Deleted

## 2012-06-29 NOTE — Telephone Encounter (Signed)
Pt reports that swelling down more on right and softer. Left starting to go down but still hard. Continues oxycodone but pain is getting better.

## 2012-07-05 ENCOUNTER — Encounter (HOSPITAL_COMMUNITY): Payer: Managed Care, Other (non HMO) | Attending: Oncology

## 2012-07-05 VITALS — BP 128/76 | HR 86 | Temp 97.0°F | Resp 20

## 2012-07-05 DIAGNOSIS — C9000 Multiple myeloma not having achieved remission: Secondary | ICD-10-CM | POA: Insufficient documentation

## 2012-07-05 DIAGNOSIS — X58XXXA Exposure to other specified factors, initial encounter: Secondary | ICD-10-CM | POA: Insufficient documentation

## 2012-07-05 LAB — COMPREHENSIVE METABOLIC PANEL
ALT: 58 U/L — ABNORMAL HIGH (ref 0–53)
AST: 70 U/L — ABNORMAL HIGH (ref 0–37)
Albumin: 3 g/dL — ABNORMAL LOW (ref 3.5–5.2)
Alkaline Phosphatase: 105 U/L (ref 39–117)
BUN: 30 mg/dL — ABNORMAL HIGH (ref 6–23)
CO2: 21 mEq/L (ref 19–32)
Calcium: 8.8 mg/dL (ref 8.4–10.5)
Chloride: 111 mEq/L (ref 96–112)
Creatinine, Ser: 1.26 mg/dL (ref 0.50–1.35)
GFR calc Af Amer: 74 mL/min — ABNORMAL LOW (ref >90)
GFR calc non Af Amer: 64 mL/min — ABNORMAL LOW (ref >90)
Glucose, Bld: 71 mg/dL (ref 70–99)
Potassium: 3.4 mEq/L — ABNORMAL LOW (ref 3.5–5.1)
Sodium: 141 mEq/L (ref 135–145)
Total Bilirubin: 0.2 mg/dL — ABNORMAL LOW (ref 0.3–1.2)
Total Protein: 7.5 g/dL (ref 6.0–8.3)

## 2012-07-05 MED ORDER — HEPARIN SOD (PORK) LOCK FLUSH 100 UNIT/ML IV SOLN
INTRAVENOUS | Status: AC
  Filled 2012-07-05: qty 5

## 2012-07-05 MED ORDER — ZOLEDRONIC ACID 4 MG/5ML IV CONC
2.0000 mg | Freq: Once | INTRAVENOUS | Status: AC
  Administered 2012-07-05: 2 mg via INTRAVENOUS
  Filled 2012-07-05: qty 2.5

## 2012-07-05 NOTE — Progress Notes (Signed)
Tolerated zometa well. Port flushed with heparin 500 units.

## 2012-07-07 ENCOUNTER — Other Ambulatory Visit (HOSPITAL_COMMUNITY): Payer: Self-pay | Admitting: Oncology

## 2012-07-07 DIAGNOSIS — C9000 Multiple myeloma not having achieved remission: Secondary | ICD-10-CM

## 2012-07-07 MED ORDER — DEXAMETHASONE 4 MG PO TABS: 20.0000 mg | ORAL_TABLET | Freq: Two times a day (BID) | ORAL | Status: DC

## 2012-07-12 ENCOUNTER — Other Ambulatory Visit (HOSPITAL_COMMUNITY): Payer: Self-pay | Admitting: Oncology

## 2012-07-12 DIAGNOSIS — C9000 Multiple myeloma not having achieved remission: Secondary | ICD-10-CM

## 2012-07-12 MED ORDER — DEXAMETHASONE 4 MG PO TABS: 20.0000 mg | ORAL_TABLET | Freq: Two times a day (BID) | ORAL | Status: DC

## 2012-07-14 ENCOUNTER — Other Ambulatory Visit: Payer: Self-pay | Admitting: Adult Health

## 2012-07-14 ENCOUNTER — Telehealth (HOSPITAL_COMMUNITY): Payer: Self-pay

## 2012-07-14 NOTE — Telephone Encounter (Signed)
Call from patient with complaints of bilateral foot pain.  Right foot worse than left and rash on right and left sides of his abdomen that started after he received zometa on 2/3.  Wanted to come in to be seen but no provider available at present. Instructed to contact his PCP and if unable to be seen to call us back.

## 2012-07-17 ENCOUNTER — Other Ambulatory Visit: Payer: Self-pay

## 2012-07-19 ENCOUNTER — Other Ambulatory Visit (HOSPITAL_COMMUNITY): Payer: Self-pay | Admitting: Oncology

## 2012-07-19 DIAGNOSIS — C9000 Multiple myeloma not having achieved remission: Secondary | ICD-10-CM

## 2012-07-19 MED ORDER — LENALIDOMIDE 5 MG PO CAPS: 5.0000 mg | ORAL_CAPSULE | Freq: Every day | ORAL | Status: DC

## 2012-07-21 ENCOUNTER — Telehealth (INDEPENDENT_AMBULATORY_CARE_PROVIDER_SITE_OTHER): Payer: Self-pay | Admitting: *Deleted

## 2012-07-21 ENCOUNTER — Other Ambulatory Visit: Payer: Self-pay | Admitting: Cardiology

## 2012-07-21 MED ORDER — CARVEDILOL 3.125 MG PO TABS: 3.1250 mg | ORAL_TABLET | Freq: Two times a day (BID) | ORAL | Status: DC

## 2012-07-21 NOTE — Telephone Encounter (Signed)
Per Dr.Rehman may call in the Torsemide 80 mg , patient is to take 1-2 by mouth daily #60 with 1 refill this was called to the [facility]. Patient called and made aware.

## 2012-07-21 NOTE — Telephone Encounter (Signed)
RX REFLL

## 2012-07-21 NOTE — Telephone Encounter (Signed)
Needs a new rx for Torsemide 2 daily called to [facility]. Dr. Laural Golden increased the medicine from 1 daily to 2 daily.

## 2012-07-26 ENCOUNTER — Telehealth (HOSPITAL_COMMUNITY): Payer: Self-pay | Admitting: Dietician

## 2012-07-26 ENCOUNTER — Ambulatory Visit (HOSPITAL_COMMUNITY): Payer: Managed Care, Other (non HMO)

## 2012-07-26 NOTE — Telephone Encounter (Signed)
Pt left voicemail at [phone]. Returned call at [facility]. Pt reports that he has been trying to follow his diet and his wife is a great support for him. He is limiting sodium and high purine foods. He reports he is tolerating cancer treatments well. He is wondering if imitation crab meat would be advisable for his diet, given that some seafood items contain high to moderate purine content. He does not purchase a particular brand and has not eaten it yet, but wanted to make sure it was ok before he purchased it. Reviewed low purine diet guidelines and discussed nutritional content of imitation crab meat. Counseled against eating this food frequently, due to high sodium content. Pt grateful for information.

## 2012-07-26 NOTE — Telephone Encounter (Signed)
Pt left voicemail on 07/23/12 at 1616. He reports he has questions about diet education from previous appointment. Returned call today at 51; pt unavailable and left message on pt voicemail.

## 2012-07-28 ENCOUNTER — Telehealth: Payer: Self-pay | Admitting: Adult Health

## 2012-07-28 ENCOUNTER — Encounter (HOSPITAL_BASED_OUTPATIENT_CLINIC_OR_DEPARTMENT_OTHER): Payer: Managed Care, Other (non HMO) | Admitting: Oncology

## 2012-07-28 ENCOUNTER — Encounter (HOSPITAL_COMMUNITY): Payer: Self-pay | Admitting: Oncology

## 2012-07-28 VITALS — BP 112/76 | HR 76 | Temp 97.7°F | Resp 20 | Wt 351.0 lb

## 2012-07-28 DIAGNOSIS — E876 Hypokalemia: Secondary | ICD-10-CM

## 2012-07-28 DIAGNOSIS — R197 Diarrhea, unspecified: Secondary | ICD-10-CM

## 2012-07-28 DIAGNOSIS — I872 Venous insufficiency (chronic) (peripheral): Secondary | ICD-10-CM

## 2012-07-28 DIAGNOSIS — C9000 Multiple myeloma not having achieved remission: Secondary | ICD-10-CM

## 2012-07-28 NOTE — Progress Notes (Signed)
#  1 kappa light chain multiple myeloma, stage II disease presently on maintenance [name] 7 mg daily 7 days on, 7 days off along with weekly dexamethasone on Friday of every week. #2 recent orchitis bilaterally status post multiple antibiotics, finally improved with IVIG #3 cellulitis of both legs treated with Keflex by Dr. [name], having just finished that 2 days ago he is much improved he states #4 massive obesity #5 diabetes mellitus initiated does need to be maintained somewhat better on Fridays Saturdays and Sundays. The sets him up for more frequent infections when it is not controlled. He will consider trying 5 mg of NovoLog insulin Friday evening and then 15 mg as he usually takes it Saturdays and Sundays mornings. Most of the other days he is between 90 and 120 for his sugar. #6 hypokalemia secondary his blood pressure medications. He just ran out for a few days. He will resume 2 potassium pills a day. #7 hypertension #8 high-volume diarrhea followed by Dr. Laural Golden with multiple causes suspected including [name]  His vital signs are stable and he is afebrile. His lab work continues to show that he is in remission status. His scrotum is well-healed and back to normal in my opinion but he is still sensitive to palpation bilaterally in both testicles. Both legs show chronic changes from chronic edema/venous insufficiency but no active cellulitis is present that I can appreciate. He has 1+ to 2+ edema bilaterally with brawny induration.  I think he is so stable he can change his lab work every 6 weeks going forward we will see him in 8 weeks.

## 2012-07-28 NOTE — Telephone Encounter (Signed)
PT WANTS TO KNOW IF HE CAN SWITCH FROM CRESTOR TO ATORVASTATIN OR LIPITOR DUE TO HIS INSURANCE NOT COVERING CRESTOR.

## 2012-07-28 NOTE — Telephone Encounter (Signed)
Please advise 

## 2012-07-28 NOTE — Patient Instructions (Addendum)
[place] Discharge Instructions  RECOMMENDATIONS MADE BY THE CONSULTANT AND ANY TEST RESULTS WILL BE SENT TO YOUR REFERRING PHYSICIAN.  EXAM FINDINGS BY THE PHYSICIAN TODAY AND SIGNS OR SYMPTOMS TO REPORT TO CLINIC OR PRIMARY PHYSICIAN:  Exam and discussion by MD.  Dennis Bast are doing well.  No changes with your revlimid but will change your blood work to every 6 weeks.  MEDICATIONS PRESCRIBED:  none  INSTRUCTIONS GIVEN AND DISCUSSED: Report fevers, night sweats, infections, etc.  SPECIAL INSTRUCTIONS/FOLLOW-UP: Labwork every 6 weeks and to be seen in follow-up in 8 weeks.  Thank you for choosing [place] to provide your oncology and hematology care.  To afford each patient quality time with our providers, please arrive at least 15 minutes before your scheduled appointment time.  With your help, our goal is to use those 15 minutes to complete the necessary work-up to ensure our physicians have the information they need to help with your evaluation and healthcare recommendations.    Effective January 1st, 2014, we ask that you re-schedule your appointment with our physicians should you arrive 10 or more minutes late for your appointment.  We strive to give you quality time with our providers, and arriving late affects you and other patients whose appointments are after yours.    Again, thank you for choosing [facility].  Our hope is that these requests will decrease the amount of time that you wait before being seen by our physicians.       _____________________________________________________________  Should you have questions after your visit to [facility], please contact our office at [phone] between the hours of 8:30 a.m. and 5:00 p.m.  Voicemails left after 4:30 p.m. will not be returned until the following business day.  For prescription refill requests, have your pharmacy contact our office with your prescription  refill request.

## 2012-07-28 NOTE — Progress Notes (Signed)
[name] presented for labwork. Labs per MD order drawn via Peripheral Line 23 gauge needle inserted in right AC  Good blood return present. Procedure without incident.  Needle removed intact. Patient tolerated procedure well.

## 2012-07-29 NOTE — Telephone Encounter (Signed)
Ok to change to atorvastatin 80 mg daily and discontinue crestor to allow insurance to pay for this. Needs followup lipids and LFT's in 3 months after this change.

## 2012-07-30 ENCOUNTER — Other Ambulatory Visit: Payer: Self-pay | Admitting: *Deleted

## 2012-07-30 ENCOUNTER — Encounter: Payer: Self-pay | Admitting: *Deleted

## 2012-07-30 DIAGNOSIS — E782 Mixed hyperlipidemia: Secondary | ICD-10-CM

## 2012-07-30 MED ORDER — ATORVASTATIN CALCIUM 80 MG PO TABS: 80.0000 mg | ORAL_TABLET | Freq: Every day | ORAL | Status: DC

## 2012-07-30 NOTE — Telephone Encounter (Signed)
Patient notified of recommendations and verbalized understanding.

## 2012-08-02 ENCOUNTER — Ambulatory Visit (INDEPENDENT_AMBULATORY_CARE_PROVIDER_SITE_OTHER): Payer: PRIVATE HEALTH INSURANCE | Admitting: Internal Medicine

## 2012-08-03 ENCOUNTER — Encounter (HOSPITAL_COMMUNITY): Payer: Managed Care, Other (non HMO) | Attending: Oncology

## 2012-08-03 DIAGNOSIS — C9000 Multiple myeloma not having achieved remission: Secondary | ICD-10-CM

## 2012-08-03 DIAGNOSIS — X58XXXA Exposure to other specified factors, initial encounter: Secondary | ICD-10-CM | POA: Insufficient documentation

## 2012-08-03 LAB — COMPREHENSIVE METABOLIC PANEL
ALT: 39 U/L (ref 0–53)
AST: 35 U/L (ref 0–37)
Albumin: 3 g/dL — ABNORMAL LOW (ref 3.5–5.2)
Alkaline Phosphatase: 99 U/L (ref 39–117)
BUN: 23 mg/dL (ref 6–23)
CO2: 27 mEq/L (ref 19–32)
Calcium: 9.1 mg/dL (ref 8.4–10.5)
Chloride: 103 mEq/L (ref 96–112)
Creatinine, Ser: 1.47 mg/dL — ABNORMAL HIGH (ref 0.50–1.35)
GFR calc Af Amer: 61 mL/min — ABNORMAL LOW (ref >90)
GFR calc non Af Amer: 53 mL/min — ABNORMAL LOW (ref >90)
Glucose, Bld: 81 mg/dL (ref 70–99)
Potassium: 3.4 mEq/L — ABNORMAL LOW (ref 3.5–5.1)
Sodium: 140 mEq/L (ref 135–145)
Total Bilirubin: 0.2 mg/dL — ABNORMAL LOW (ref 0.3–1.2)
Total Protein: 6.5 g/dL (ref 6.0–8.3)

## 2012-08-03 LAB — CBC WITH DIFFERENTIAL/PLATELET
Basophils Absolute: 0 10*3/uL (ref 0.0–0.1)
Basophils Relative: 0 % (ref 0–1)
Eosinophils Absolute: 0.3 10*3/uL (ref 0.0–0.7)
Eosinophils Relative: 4 % (ref 0–5)
HCT: 33.4 % — ABNORMAL LOW (ref 39.0–52.0)
Hemoglobin: 10.9 g/dL — ABNORMAL LOW (ref 13.0–17.0)
Lymphocytes Relative: 37 % (ref 12–46)
Lymphs Abs: 2.9 10*3/uL (ref 0.7–4.0)
MCH: 30 pg (ref 26.0–34.0)
MCHC: 32.6 g/dL (ref 30.0–36.0)
MCV: 92 fL (ref 78.0–100.0)
Monocytes Absolute: 1.3 10*3/uL — ABNORMAL HIGH (ref 0.1–1.0)
Monocytes Relative: 16 % — ABNORMAL HIGH (ref 3–12)
Neutro Abs: 3.3 10*3/uL (ref 1.7–7.7)
Neutrophils Relative %: 42 % — ABNORMAL LOW (ref 43–77)
Platelets: 138 10*3/uL — ABNORMAL LOW (ref 150–400)
RBC: 3.63 MIL/uL — ABNORMAL LOW (ref 4.22–5.81)
RDW: 15.7 % — ABNORMAL HIGH (ref 11.5–15.5)
WBC: 7.7 10*3/uL (ref 4.0–10.5)

## 2012-08-03 MED ORDER — ZOLEDRONIC ACID 4 MG/5ML IV CONC
2.0000 mg | Freq: Once | INTRAVENOUS | Status: AC
  Administered 2012-08-03: 2 mg via INTRAVENOUS
  Filled 2012-08-03: qty 2.5

## 2012-08-03 MED ORDER — SODIUM CHLORIDE 0.9 % IJ SOLN
10.0000 mL | INTRAMUSCULAR | Status: DC | PRN
  Filled 2012-08-03: qty 10

## 2012-08-03 MED ORDER — SODIUM CHLORIDE 0.9 % IV SOLN
INTRAVENOUS | Status: DC
  Administered 2012-08-03: 15:00:00 via INTRAVENOUS

## 2012-08-03 MED ORDER — HEPARIN SOD (PORK) LOCK FLUSH 100 UNIT/ML IV SOLN
500.0000 [IU] | Freq: Once | INTRAVENOUS | Status: AC
  Administered 2012-08-03: 500 [IU] via INTRAVENOUS
  Filled 2012-08-03: qty 5

## 2012-08-03 MED ORDER — HEPARIN SOD (PORK) LOCK FLUSH 100 UNIT/ML IV SOLN
INTRAVENOUS | Status: AC
  Filled 2012-08-03: qty 5

## 2012-08-04 ENCOUNTER — Encounter: Payer: Self-pay | Admitting: Adult Health

## 2012-08-04 ENCOUNTER — Other Ambulatory Visit: Payer: Self-pay | Admitting: *Deleted

## 2012-08-04 ENCOUNTER — Ambulatory Visit (INDEPENDENT_AMBULATORY_CARE_PROVIDER_SITE_OTHER): Payer: Managed Care, Other (non HMO) | Admitting: Adult Health

## 2012-08-04 VITALS — BP 110/66 | HR 60 | Ht 75.0 in | Wt 350.0 lb

## 2012-08-04 DIAGNOSIS — I2510 Atherosclerotic heart disease of native coronary artery without angina pectoris: Secondary | ICD-10-CM

## 2012-08-04 DIAGNOSIS — I519 Heart disease, unspecified: Secondary | ICD-10-CM

## 2012-08-04 DIAGNOSIS — I10 Essential (primary) hypertension: Secondary | ICD-10-CM

## 2012-08-04 DIAGNOSIS — I7090 Unspecified atherosclerosis: Secondary | ICD-10-CM

## 2012-08-04 LAB — KAPPA/LAMBDA LIGHT CHAINS
Kappa free light chain: 2.66 mg/dL — ABNORMAL HIGH (ref 0.33–1.94)
Kappa, lambda light chain ratio: 0.98 (ref 0.26–1.65)
Lambda free light chains: 2.71 mg/dL — ABNORMAL HIGH (ref 0.57–2.63)

## 2012-08-04 NOTE — Assessment & Plan Note (Signed)
Blood pressure is well-controlled currently. We will not make any changes in his medicines at this time. He will continue on carvedilol 3.125 mg twice a day, with potassium replacement. He is also to continue torsemide, with adjustments per Dr. Melony Overly who has increased the dose. I think that he will be able to be decreased on assisted 20 mg daily.

## 2012-08-04 NOTE — Assessment & Plan Note (Signed)
We will repeat echocardiogram in 4 months and see him again in 6 months. I will old that we will see an improvement in his LV function. If not up titration are of carvedilol will be considered. Other this chemotherapy is completed we will give his heart time to recover and will make further recommendations in 6 months. He knows to call should he have any symptoms of CHF. I have reviewed these symptoms with him and he verbalizes understanding.

## 2012-08-04 NOTE — Progress Notes (Deleted)
Name: [name]    DOB: 03/09/1961  Age: 52 y.o.  MR#: [id number]       PCP:  [name], MD      Insurance: Payor: [employer]  Plan: [employer] MANAGED  Product Type: *No Product type*    CC:    Chief Complaint  Patient presents with  . Coronary Artery Disease  . Hypertension    VS Filed Vitals:   08/04/12 1408  BP: 110/66  Pulse: 60  Height: 6\' 3"  (1.905 m)  Weight: 350 lb (158.759 kg)    Weights Current Weight  08/04/12 350 lb (158.759 kg)  07/28/12 351 lb (159.213 kg)  06/15/12 348 lb 1.6 oz (157.897 kg)    Blood Pressure  BP Readings from Last 3 Encounters:  08/04/12 110/66  07/28/12 112/76  07/05/12 128/76     Admit date:  (Not on file) Last encounter with RMR:  07/28/2012   Allergy Review of patient's allergies indicates no known allergies.  Current Outpatient Prescriptions  Medication Sig Dispense Refill  . acyclovir (ZOVIRAX) 400 MG tablet Take 400 mg by mouth every morning.      Marland Kitchen allopurinol (ZYLOPRIM) 300 MG tablet Take 1 tablet (300 mg total) by mouth daily.  30 tablet  2  . aspirin EC 325 MG tablet Take 325 mg by mouth daily.       Marland Kitchen atorvastatin (LIPITOR) 80 MG tablet Take 1 tablet (80 mg total) by mouth daily.  90 tablet  3  . Calcium Carbonate-Vit D-Min (CALCIUM 1200 PO) Take 1,200 mg by mouth daily.      . carvedilol (COREG) 3.125 MG tablet Take 1 tablet (3.125 mg total) by mouth 2 (two) times daily with a meal.  60 tablet  6  . Cholecalciferol (VITAMIN D) 2000 UNITS CAPS Take 1 capsule by mouth every morning.       Marland Kitchen dexamethasone (DECADRON) 4 MG tablet Take 5 tablets (20 mg total) by mouth 2 (two) times daily. Every Friday  40 tablet  2  . dexlansoprazole (DEXILANT) 60 MG capsule Take 60 mg by mouth every morning.       . diphenoxylate-atropine (LOMOTIL) 2.5-0.025 MG per tablet Take 2 tablets by mouth every morning. Max 8 per day      . insulin aspart (NOVOLOG) 100 UNIT/ML injection Inject 5-20 Units into the skin 3 (three) times daily before meals.  Sliding scale      . insulin detemir (LEVEMIR) 100 UNIT/ML injection Inject 60 Units into the skin at bedtime.      Marland Kitchen L-Methylfolate-B6-B12 (METANX PO) Take 1 tablet by mouth every morning.       Marland Kitchen lenalidomide ([name]) 5 MG capsule Take 1 capsule (5 mg total) by mouth daily. Take 5 mg for 7 days on then off for 7 days and repeat  14 capsule  1  . lidocaine-prilocaine (EMLA) cream Apply 1 application topically as needed. To port site. Apply 1 hour prior to treatment. Do not rub in. Cover with plastic.      Marland Kitchen loratadine (CLARITIN) 10 MG tablet Take 10 mg by mouth every morning.       . Multiple Vitamins-Minerals (MULTIVITAMINS THER. W/MINERALS) TABS Take 1 tablet by mouth every morning.       . ondansetron (ZOFRAN) 8 MG tablet Take 8 mg by mouth 2 (two) times daily as needed. Nausea/vomiting. Patient to also take this 30 minutes to 1 hour prior to Velcade injections.      Marland Kitchen oxyCODONE-acetaminophen (PERCOCET) 7.5-325 MG  per tablet Take 1 tablet by mouth every 6 (six) hours as needed.  50 tablet  0  . Pancrelipase, Lip-Prot-Amyl, 36000 UNITS CPEP Take [id number] Units by mouth 3 (three) times daily. Take two with meals and one with snacks      . potassium chloride SA (K-DUR,KLOR-CON) 20 MEQ tablet Take 20 mEq by mouth daily. Take 2 tabs daily      . torsemide (DEMADEX) 20 MG tablet Take 1 tablet (20 mg total) by mouth every morning.  30 tablet  5  . Zoledronic Acid (ZOMETA IV) Inject 2 mg into the vein every 28 (twenty-eight) days. To start Jul 11, 2011       No current facility-administered medications for this visit.    Discontinued Meds:    Medications Discontinued During This Encounter  Medication Reason  . sulfamethoxazole-trimethoprim (BACTRIM DS,SEPTRA DS) 800-160 MG per tablet Error  . insulin glargine (LANTUS) 100 UNIT/ML injection Error    Patient Active Problem List  Diagnosis  . DIABETES MELLITUS, TYPE II, ON INSULIN  . Morbid obesity  . SLEEP APNEA  . Multiple myeloma  .  Arteriosclerotic cardiovascular disease (ASCVD)  . Hypertension  . Hyperlipidemia  . Acute on chronic renal failure  . Diarrhea  . Orthostasis  . Edema of both legs  . Systolic dysfunction  . Dehydration  . Arthritis, shoulder region  . DDD (degenerative disc disease), cervical  . Pain in joint, shoulder region  . Muscle weakness (generalized)    LABS    Component Value Date/Time   NA 140 08/03/2012 1520   NA 141 07/05/2012 1546   NA 142 06/07/2012 1510   K 3.4* 08/03/2012 1520   K 3.4* 07/05/2012 1546   K 3.5 06/07/2012 1510   CL 103 08/03/2012 1520   CL 111 07/05/2012 1546   CL 112 06/07/2012 1510   CO2 27 08/03/2012 1520   CO2 21 07/05/2012 1546   CO2 20 06/07/2012 1510   GLUCOSE 81 08/03/2012 1520   GLUCOSE 71 07/05/2012 1546   GLUCOSE 92 06/07/2012 1510   BUN 23 08/03/2012 1520   BUN 30* 07/05/2012 1546   BUN 25* 06/07/2012 1510   CREATININE 1.47* 08/03/2012 1520   CREATININE 1.26 07/05/2012 1546   CREATININE 1.24 06/07/2012 1510   CREATININE 1.60* 10/20/2011 1758   CALCIUM 9.1 08/03/2012 1520   CALCIUM 8.8 07/05/2012 1546   CALCIUM 8.0* 06/07/2012 1510   GFRNONAA 53* 08/03/2012 1520   GFRNONAA 64* 07/05/2012 1546   GFRNONAA 65* 06/07/2012 1510   GFRAA 61* 08/03/2012 1520   GFRAA 74* 07/05/2012 1546   GFRAA 75* 06/07/2012 1510   CMP     Component Value Date/Time   NA 140 08/03/2012 1520   K 3.4* 08/03/2012 1520   CL 103 08/03/2012 1520   CO2 27 08/03/2012 1520   GLUCOSE 81 08/03/2012 1520   BUN 23 08/03/2012 1520   CREATININE 1.47* 08/03/2012 1520   CREATININE 1.60* 10/20/2011 1758   CALCIUM 9.1 08/03/2012 1520   PROT 6.5 08/03/2012 1520   PROT 6.0 08/03/2012 1520   ALBUMIN 3.0* 08/03/2012 1520   AST 35 08/03/2012 1520   ALT 39 08/03/2012 1520   ALKPHOS 99 08/03/2012 1520   BILITOT 0.2* 08/03/2012 1520   GFRNONAA 53* 08/03/2012 1520   GFRAA 61* 08/03/2012 1520       Component Value Date/Time   WBC 7.7 08/03/2012 1520   WBC 9.3 06/07/2012 1510   WBC 14.7* 05/06/2012 1436   HGB 10.9* 08/03/2012 1520  HGB 10.1* 06/07/2012 1510   HGB  11.7* 05/06/2012 1436   HCT 33.4* 08/03/2012 1520   HCT 30.0* 06/07/2012 1510   HCT 34.8* 05/06/2012 1436   MCV 92.0 08/03/2012 1520   MCV 89.6 06/07/2012 1510   MCV 90.2 05/06/2012 1436    Lipid Panel     Component Value Date/Time   CHOL 158 07/18/2010 1724   TRIG 173 07/18/2010 1724   HDL 33 07/18/2010 1724   LDLCALC 90 07/18/2010 1724    ABG No results found for this basename: phart, pco2, pco2art, po2, po2art, hco3, tco2, acidbasedef, o2sat     Lab Results  Component Value Date   TSH 0.822 09/24/2011   BNP (last 3 results) No results found for this basename: PROBNP,  in the last 8760 hours Cardiac Panel (last 3 results) No results found for this basename: CKTOTAL, CKMB, TROPONINI, RELINDX,  in the last 72 hours  Iron/TIBC/Ferritin No results found for this basename: iron, tibc, ferritin     EKG Orders placed in visit on 08/04/12  . EKG 12-LEAD     Prior Assessment and Plan Problem List as of 08/04/2012     ICD-9-CM     Cardiology Problems   Arteriosclerotic cardiovascular disease (ASCVD)   Last Assessment & Plan   10/15/2011 Office Visit Written 10/15/2011  5:15 PM by Lendon Colonel, NP     No complaints of chest pain. He will restart low dose beta blocker. BP is well within range to do this. Will not start back the ramipril at this time. Will consider repeating echo on next visit.    Hypertension   Last Assessment & Plan   10/15/2011 Office Visit Written 10/15/2011  5:16 PM by Lendon Colonel, NP     Moderately elevated. Will re-evaluate once he begins to diurese.     Hyperlipidemia   Last Assessment & Plan   07/28/2011 Office Visit Written 07/28/2011  1:10 PM by Yehuda Savannah, MD     Lipid profiles within the past year have been reasonable.  Current medication will be continued.    Orthostasis     Other   DIABETES MELLITUS, TYPE II, ON INSULIN   Last Assessment & Plan   07/28/2011 Office Visit Edited 08/03/2011  8:19 AM by Yehuda Savannah, MD     Diabetic  control has been worse since treatment with glucocorticoids initiated.  I suggest allowing somewhat less than optimal control during treatment for multiple myeloma and subsequently aiming for a hemoglobin A1c level of 7.    Morbid obesity   Last Assessment & Plan   07/28/2011 Office Visit Written 07/28/2011  1:11 PM by Yehuda Savannah, MD     Patient is congratulated on a drop in weight loss and encouraged to continue to be as active as possible and to limit caloric intake.      SLEEP APNEA   Multiple myeloma   Acute on chronic renal failure   Diarrhea   Edema of both legs   Systolic dysfunction   Last Assessment & Plan   10/21/2011 Office Visit Written 10/21/2011  2:06 PM by Lendon Colonel, NP     He is doing much better with reinstitution of torsemide. His creatinine is 1.60, Potassium of 4.0. He is less edematous and is breathing better. I have spoken with oncology today who are in agreement with diuretic plan fluid restriction of 64 oz. There is a plan in the works to have him seen at [facility]  for bone marrow transplant at some point in the future. He will need stress test. They will notify us when this is necessary timing-wise. He will be seen in 6 months.    Dehydration   Arthritis, shoulder region   DDD (degenerative disc disease), cervical   Pain in joint, shoulder region   Muscle weakness (generalized)       Imaging: No results found.

## 2012-08-04 NOTE — Progress Notes (Signed)
HPI: [name] is a 52 y/o morbidly obese patient of Dr.Rothbart we are following for non-ischemic cardiomyopathy, EF of 30-35%  per echo in March  of 2013, CAD, hypertension, with known history of multiple myeloma. He was last seen by me in May of 2013, with medication adjustments in the setting of CHF, increasing his toresemide dosing. Repeat labs this month demonstrate Creatinine of 1.47, BUN of 23 and potassium of 3.4. He is followed by Dr. [name] for multiple myeloma.   Has had complaints of cellulitis last week, treated with antibiotics. Toresemide was adjusted during cellulitis due to fluid retention.Per Dr. Laural Golden.. Wt went up 8 lbs. . Now taking 2 torsemide (20 mg tablets)  in the am. When fluid goes down back to one a day. Myeloma is in remission as of January! Now on maintanence medications only. Blood work followup this week. He offers no complaints of chest pain, dyspnea on exertion, dizziness, or diaphoresis. He states he is feeling better off the chemotherapy and is hopeful that he will stay in remission.  No Known Allergies  Current Outpatient Prescriptions  Medication Sig Dispense Refill  . acyclovir (ZOVIRAX) 400 MG tablet Take 400 mg by mouth every morning.      Marland Kitchen allopurinol (ZYLOPRIM) 300 MG tablet Take 1 tablet (300 mg total) by mouth daily.  30 tablet  2  . aspirin EC 325 MG tablet Take 325 mg by mouth daily.       Marland Kitchen atorvastatin (LIPITOR) 80 MG tablet Take 1 tablet (80 mg total) by mouth daily.  90 tablet  3  . carvedilol (COREG) 3.125 MG tablet Take 1 tablet (3.125 mg total) by mouth 2 (two) times daily with a meal.  60 tablet  6  . Cholecalciferol (VITAMIN D) 2000 UNITS CAPS Take 1 capsule by mouth every morning.       Marland Kitchen dexamethasone (DECADRON) 4 MG tablet Take 5 tablets (20 mg total) by mouth 2 (two) times daily. Every Friday  40 tablet  2  . dexlansoprazole (DEXILANT) 60 MG capsule Take 60 mg by mouth every morning.       . diphenoxylate-atropine (LOMOTIL)  2.5-0.025 MG per tablet Take 2 tablets by mouth every morning. Max 8 per day      . insulin aspart (NOVOLOG) 100 UNIT/ML injection Inject 5-20 Units into the skin 3 (three) times daily before meals. Sliding scale      . insulin glargine (LANTUS) 100 UNIT/ML injection Inject 60 Units into the skin daily before breakfast.      . L-Methylfolate-B6-B12 (METANX PO) Take 1 tablet by mouth every morning.       Marland Kitchen lenalidomide ([name]) 5 MG capsule Take 1 capsule (5 mg total) by mouth daily. Take 5 mg for 7 days on then off for 7 days and repeat  14 capsule  1  . lidocaine-prilocaine (EMLA) cream Apply 1 application topically as needed. To port site. Apply 1 hour prior to treatment. Do not rub in. Cover with plastic.      Marland Kitchen loratadine (CLARITIN) 10 MG tablet Take 10 mg by mouth every morning.       . Multiple Vitamins-Minerals (MULTIVITAMINS THER. W/MINERALS) TABS Take 1 tablet by mouth every morning.       . ondansetron (ZOFRAN) 8 MG tablet Take 8 mg by mouth 2 (two) times daily as needed. Nausea/vomiting. Patient to also take this 30 minutes to 1 hour prior to Velcade injections.      Marland Kitchen oxyCODONE-acetaminophen (PERCOCET) 7.5-325 MG  per tablet Take 1 tablet by mouth every 6 (six) hours as needed.  50 tablet  0  . Pancrelipase, Lip-Prot-Amyl, 36000 UNITS CPEP Take [id number] Units by mouth 3 (three) times daily. Take two with meals and one with snacks      . potassium chloride SA (K-DUR,KLOR-CON) 20 MEQ tablet Take 20 mEq by mouth daily. Take 2 tabs daily      . sulfamethoxazole-trimethoprim (BACTRIM DS,SEPTRA DS) 800-160 MG per tablet Take 1 tablet by mouth every Monday, Wednesday, and Friday. Take 1 tablet, PO, Monday-Wednesday-Friday  12 tablet  5  . torsemide (DEMADEX) 20 MG tablet Take 1 tablet (20 mg total) by mouth every morning.  30 tablet  5  . Zoledronic Acid (ZOMETA IV) Inject 2 mg into the vein every 28 (twenty-eight) days. To start Jul 11, 2011       No current facility-administered  medications for this visit.    Past Medical History  Diagnosis Date  . Arteriosclerotic cardiovascular disease (ASCVD)     MI-2000s; stent to the proximal LAD and diagonal in 2001; stress nuclear in 2008-impaired exercise capacity, left ventricular dilatation, moderately to severely depressed EF, apical, inferior and anteroseptal scar  . Sleep apnea   . Hypertension   . 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 Multiple myeloma(203.0) 07/01/2011  . Diabetes mellitus     Insulin  . GERD (gastroesophageal reflux disease)   . Pedal edema     Venous insufficiency  . Obesity   . Hyperlipidemia   . Chronic kidney disease, stage 3, mod decreased GFR     Creatinine of 1.84 in 06/2011 and 1.5 in 07/2011  . Injection site reaction   . Cellulitis of leg     both legs    Past Surgical History  Procedure Laterality Date  . Laparoscopic gastric banding  2006  . Wrist surgery      Left; removal of bone fragment  . Incision and drainage abscess anal    . Abscess drainage      Scrotal  . Bone marrow biopsy  05/13/11  . Portacath placement  07/07/2011    Procedure: INSERTION PORT-A-CATH;  Surgeon: Scherry Ran, MD;  Location: [facility];  Service: General;  Laterality: N/A;  . Colonoscopy  11/28/2011    Procedure: COLONOSCOPY;  Surgeon: Rogene Houston, MD;  Location: [facility];  Service: Endoscopy;  Laterality: N/A;  930  . Esophagogastroduodenoscopy  01/02/2012    Procedure: ESOPHAGOGASTRODUODENOSCOPY (EGD);  Surgeon: Rogene Houston, MD;  Location: [facility];  Service: Endoscopy;  Laterality: N/A;  100  . Esophageal biopsy  01/02/2012    Procedure: BIOPSY;  Surgeon: Rogene Houston, MD;  Location: [facility];  Service: Endoscopy;  Laterality: N/A;    [id number] of systems complete and found to be negative unless listed above  PHYSICAL EXAM There were no vitals taken for this visit. General: Well developed, well nourished, in no acute distress Head: Eyes PERRLA, No xanthomas.   Normal  cephalic and atramatic  Lungs: Clear bilaterally to auscultation and percussion. Heart: HRRR S1 S2, without MRG.  Pulses are 2+ & equal.            No carotid bruit. No JVD.  No abdominal bruits. No femoral bruits. Abdomen: Bowel sounds are positive, abdomen soft and non-tender without masses or                  Hernia's noted. Msk:  Back normal, normal gait. Normal strength and tone  for age. Extremities: No clubbing, cyanosis or edema.  Venous stasis skin changes, No pain in the legs.  DP +1 Neuro: Alert and oriented X 3. Psych:  Good affect, responds appropriately    ASSESSMENT AND PLAN

## 2012-08-04 NOTE — Patient Instructions (Signed)
Your physician recommends that you schedule a follow-up appointment in: 5 months  Your physician has requested that you have an echocardiogram in 4 months. Echocardiography is a painless test that uses sound waves to create images of your heart. It provides your doctor with information about the size and shape of your heart and how well your heart's chambers and valves are working. This procedure takes approximately one hour. There are no restrictions for this procedure.

## 2012-08-04 NOTE — Assessment & Plan Note (Signed)
He offers no complaints at this time. I will repeat his echocardiogram in 4 months post chemotherapy cessation. Last echocardiogram was completed in March 2013, during ongoing treatment for multiple myeloma. He denies any cardiac symptoms, fluid retention occurred during cellulitis with torsemide adjustment. He can probably go back down to the 20 mg once a day now from 40 mg daily as I am not seeing any evidence of fluid retention. He is due to followup with Dr. Abran Duke and Dr.Rehman this week. Recent labs have been drawn, we will defer to them for any other changes in his medications.

## 2012-08-09 ENCOUNTER — Telehealth (INDEPENDENT_AMBULATORY_CARE_PROVIDER_SITE_OTHER): Payer: Self-pay | Admitting: *Deleted

## 2012-08-09 LAB — MULTIPLE MYELOMA PANEL, SERUM
Albumin ELP: 51.8 % — ABNORMAL LOW (ref 55.8–66.1)
Alpha-1-Globulin: 6.3 % — ABNORMAL HIGH (ref 2.9–4.9)
Alpha-2-Globulin: 12.6 % — ABNORMAL HIGH (ref 7.1–11.8)
Beta 2: 6.3 % (ref 3.2–6.5)
Beta Globulin: 7 % (ref 4.7–7.2)
Gamma Globulin: 16 % (ref 11.1–18.8)
IgA: 127 mg/dL (ref 68–379)
IgG (Immunoglobin G), Serum: 920 mg/dL (ref 650–1600)
IgM, Serum: 141 mg/dL (ref 41–251)
M-Spike, %: NOT DETECTED g/dL
Total Protein: 6 g/dL (ref 6.0–8.3)

## 2012-08-09 NOTE — Telephone Encounter (Signed)
LM asking Tammy to please give him a return call to [phone]. He only has one more day of Creon. He would like to talk about a rx or another medication.

## 2012-08-10 ENCOUNTER — Other Ambulatory Visit (HOSPITAL_COMMUNITY): Payer: Self-pay | Admitting: Oncology

## 2012-08-10 ENCOUNTER — Other Ambulatory Visit (INDEPENDENT_AMBULATORY_CARE_PROVIDER_SITE_OTHER): Payer: Self-pay | Admitting: Internal Medicine

## 2012-08-10 DIAGNOSIS — C9000 Multiple myeloma not having achieved remission: Secondary | ICD-10-CM

## 2012-08-10 MED ORDER — LENALIDOMIDE 5 MG PO CAPS: 5.0000 mg | ORAL_CAPSULE | Freq: Every day | ORAL | Status: DC

## 2012-08-10 NOTE — Telephone Encounter (Signed)
Dr.Rehman is going to send prescription electronic to [facility] Patient called and made aware.

## 2012-08-10 NOTE — Telephone Encounter (Signed)
Per Dr.Rehman may give Zenpep 25.000 units  3 with meal and 1 with snack

## 2012-08-13 ENCOUNTER — Other Ambulatory Visit (HOSPITAL_COMMUNITY): Payer: Self-pay | Admitting: Oncology

## 2012-08-13 DIAGNOSIS — C9000 Multiple myeloma not having achieved remission: Secondary | ICD-10-CM

## 2012-08-13 MED ORDER — ACYCLOVIR 400 MG PO TABS: 400.0000 mg | ORAL_TABLET | Freq: Every morning | ORAL | Status: DC

## 2012-08-13 MED ORDER — ALLOPURINOL 300 MG PO TABS: 300.0000 mg | ORAL_TABLET | Freq: Every day | ORAL | Status: DC

## 2012-08-17 ENCOUNTER — Ambulatory Visit (INDEPENDENT_AMBULATORY_CARE_PROVIDER_SITE_OTHER): Payer: Managed Care, Other (non HMO) | Admitting: Internal Medicine

## 2012-08-17 ENCOUNTER — Encounter (INDEPENDENT_AMBULATORY_CARE_PROVIDER_SITE_OTHER): Payer: Self-pay | Admitting: Internal Medicine

## 2012-08-17 VITALS — BP 110/68 | HR 72 | Temp 97.7°F | Resp 18 | Ht 75.0 in | Wt 340.9 lb

## 2012-08-17 DIAGNOSIS — K903 Pancreatic steatorrhea: Secondary | ICD-10-CM

## 2012-08-17 NOTE — Patient Instructions (Signed)
Call if diarrhea worsens.

## 2012-08-17 NOTE — Progress Notes (Signed)
Presenting complaint;  Followup for pancreatic steatorrhea.  Subjective:  [name] is 52 year old African male who is here for scheduled visit Compazine by his wife [name]. He was last seen 10 weeks ago. He has gained 4 pounds since his last visit. He was treated with antibiotics for lower extremity cellulitis since his last visit. He also received gammaglobulin IV twice in January. He is back on [name]. He remains with intermittent diarrhea. He is not showed if he has more diarrhea while he is on [name] on the following week when he's not receiving [name]. He has an average of 2-3 stools per day but is on her worst days he could have as many as 6. He has intermittent epigastric pain relieved with passage of flatness a bowel movement. His appetite is good. He denies melena or rectal bleeding. He also denies nausea or vomiting. He has at least one or 2 normal bowel movements every week. He also complains of tingling in his feet. He has some difficulty walking. He is still working full-time.  Current Medications: Current Outpatient Prescriptions  Medication Sig Dispense Refill  . acyclovir (ZOVIRAX) 400 MG tablet Take 1 tablet (400 mg total) by mouth every morning.  30 tablet  3  . allopurinol (ZYLOPRIM) 300 MG tablet Take 1 tablet (300 mg total) by mouth daily.  30 tablet  3  . aspirin EC 325 MG tablet Take 325 mg by mouth daily.       Marland Kitchen atorvastatin (LIPITOR) 80 MG tablet Take 1 tablet (80 mg total) by mouth daily.  90 tablet  3  . Calcium Carbonate-Vit D-Min (CALCIUM 1200 PO) Take 1,200 mg by mouth daily.      . carvedilol (COREG) 3.125 MG tablet Take 1 tablet (3.125 mg total) by mouth 2 (two) times daily with a meal.  60 tablet  6  . Cholecalciferol (VITAMIN D) 2000 UNITS CAPS Take 1 capsule by mouth every morning.       Marland Kitchen dexamethasone (DECADRON) 4 MG tablet Take 5 tablets (20 mg total) by mouth 2 (two) times daily. Every Friday  40 tablet  2  . dexlansoprazole (DEXILANT) 60 MG capsule  Take 60 mg by mouth every morning.       . diphenoxylate-atropine (LOMOTIL) 2.5-0.025 MG per tablet Take 2 tablets by mouth every morning. Max 8 per day      . insulin aspart (NOVOLOG) 100 UNIT/ML injection Inject 5-20 Units into the skin 3 (three) times daily before meals. Sliding scale      . insulin detemir (LEVEMIR) 100 UNIT/ML injection Inject 60 Units into the skin at bedtime.      Marland Kitchen L-Methylfolate-B6-B12 (METANX PO) Take 1 tablet by mouth every morning.       Marland Kitchen lenalidomide ([name]) 5 MG capsule Take 1 capsule (5 mg total) by mouth daily. Take 5 mg for 7 days on then off for 7 days and repeat  14 capsule  1  . lidocaine-prilocaine (EMLA) cream Apply 1 application topically as needed. To port site. Apply 1 hour prior to treatment. Do not rub in. Cover with plastic.      Marland Kitchen loratadine (CLARITIN) 10 MG tablet Take 10 mg by mouth every morning.       . Multiple Vitamins-Minerals (MULTIVITAMINS THER. W/MINERALS) TABS Take 1 tablet by mouth every morning.       . ondansetron (ZOFRAN) 8 MG tablet Take 8 mg by mouth 2 (two) times daily as needed. Nausea/vomiting. Patient to also take this 30 minutes to 1  hour prior to Velcade injections.      Marland Kitchen oxyCODONE-acetaminophen (PERCOCET) 7.5-325 MG per tablet Take 1 tablet by mouth every 6 (six) hours as needed.  50 tablet  0  . Pancrelipase, Lip-Prot-Amyl, 36000 UNITS CPEP Take [id number] Units by mouth 3 (three) times daily. Take two with meals and one with snacks      . potassium chloride SA (K-DUR,KLOR-CON) 20 MEQ tablet Take 20 mEq by mouth daily. Take 2 tabs daily      . torsemide (DEMADEX) 20 MG tablet Take 1 tablet (20 mg total) by mouth every morning.  30 tablet  5  . Zoledronic Acid (ZOMETA IV) Inject 2 mg into the vein every 28 (twenty-eight) days. To start Jul 11, 2011       No current facility-administered medications for this visit.     Objective: Blood pressure 110/68, pulse 72, temperature 97.7 F (36.5 C), temperature source Oral,  resp. rate 18, height 6\' 3"  (1.905 m), weight 340 lb 14.4 oz (154.631 kg). Patient is alert and in no acute distress. Conjunctiva is pink. Sclera is nonicteric Oropharyngeal mucosa is normal. No neck masses or thyromegaly noted. Cardiac exam with regular rhythm normal S1 and S2. No murmur or gallop noted. Lungs are clear to auscultation. Abdomen is obese. Bowel sounds are normal. Abdomen is soft with very tympanitic percussion sound and epigastric region. No organomegaly or masses.  He has trace edema to his lower extremities.  Labs/studies Results: Lab data from 08/03/2012. WBC 7.7, H&H 10.9 and 33.4. Platelet count [id number] Metabolic 7 pertinent for serum potassium of 3.4 and creatinine of 1.47. Calcium 9.1. Bilirubin 0.2, AP 99, AST 35, ALT 39 albumin 3.0.     Assessment:  #1.Chronic diarrhea secondary to pancreatic steatorrhea. He is having more diarrhea since he's been back on her abdomen but it is manageable. He may have yet another mechanism for his diarrhea i.e. the mucosal  disease or motility disorder. If diarrhea becomes unmanageable he may have to be switched to another medication for his multiple myeloma.  #2. Morbid obesity. He needs to slowly increase physical activity and try to lose weight. He seems motivated.  Plan:  Patient will continue Lomotil and pancreatic enzyme supplement as before. Patient advised that he must gradually increase physical activity in order to lose weight as weight reduction would improve his quality of life greatly.

## 2012-08-30 ENCOUNTER — Telehealth (INDEPENDENT_AMBULATORY_CARE_PROVIDER_SITE_OTHER): Payer: Self-pay | Admitting: *Deleted

## 2012-08-30 NOTE — Telephone Encounter (Signed)
Khari has left 2 messages for Tammy to please return his call on 08/30/12. Our message states she is in clinic with Dr. Laural Golden. Domingos would like for Tammy to know he has no Creon left and only 1 day of the other medication.

## 2012-08-30 NOTE — Telephone Encounter (Signed)
LM stating he only has 2 days left of his Creon and 4 1/2 bottles of Zimpeck.  The return phone number is [phone] and [phone].

## 2012-08-30 NOTE — Telephone Encounter (Signed)
Patient called and advised that we had 2 bottles of Zenpep for him to pick up.

## 2012-08-31 ENCOUNTER — Encounter (HOSPITAL_COMMUNITY): Payer: Managed Care, Other (non HMO) | Attending: Oncology

## 2012-08-31 VITALS — BP 129/75 | HR 68 | Temp 98.7°F | Resp 16

## 2012-08-31 DIAGNOSIS — X58XXXA Exposure to other specified factors, initial encounter: Secondary | ICD-10-CM | POA: Insufficient documentation

## 2012-08-31 DIAGNOSIS — C9000 Multiple myeloma not having achieved remission: Secondary | ICD-10-CM | POA: Insufficient documentation

## 2012-08-31 DIAGNOSIS — Z5112 Encounter for antineoplastic immunotherapy: Secondary | ICD-10-CM

## 2012-08-31 LAB — COMPREHENSIVE METABOLIC PANEL
ALT: 74 U/L — ABNORMAL HIGH (ref 0–53)
AST: 66 U/L — ABNORMAL HIGH (ref 0–37)
Albumin: 3.2 g/dL — ABNORMAL LOW (ref 3.5–5.2)
Alkaline Phosphatase: 85 U/L (ref 39–117)
BUN: 21 mg/dL (ref 6–23)
CO2: 21 mEq/L (ref 19–32)
Calcium: 8.7 mg/dL (ref 8.4–10.5)
Chloride: 107 mEq/L (ref 96–112)
Creatinine, Ser: 1.48 mg/dL — ABNORMAL HIGH (ref 0.50–1.35)
GFR calc Af Amer: 61 mL/min — ABNORMAL LOW (ref >90)
GFR calc non Af Amer: 52 mL/min — ABNORMAL LOW (ref >90)
Glucose, Bld: 115 mg/dL — ABNORMAL HIGH (ref 70–99)
Potassium: 3.5 mEq/L (ref 3.5–5.1)
Sodium: 138 mEq/L (ref 135–145)
Total Bilirubin: 0.3 mg/dL (ref 0.3–1.2)
Total Protein: 6.1 g/dL (ref 6.0–8.3)

## 2012-08-31 MED ORDER — SODIUM CHLORIDE 0.9 % IJ SOLN
10.0000 mL | INTRAMUSCULAR | Status: DC | PRN
  Administered 2012-08-31: 10 mL via INTRAVENOUS
  Filled 2012-08-31: qty 10

## 2012-08-31 MED ORDER — HEPARIN SOD (PORK) LOCK FLUSH 100 UNIT/ML IV SOLN
INTRAVENOUS | Status: AC
  Filled 2012-08-31: qty 5

## 2012-08-31 MED ORDER — ZOLEDRONIC ACID 4 MG/5ML IV CONC
2.0000 mg | Freq: Once | INTRAVENOUS | Status: AC
  Administered 2012-08-31: 2 mg via INTRAVENOUS
  Filled 2012-08-31: qty 2.5

## 2012-08-31 MED ORDER — HEPARIN SOD (PORK) LOCK FLUSH 100 UNIT/ML IV SOLN
500.0000 [IU] | Freq: Once | INTRAVENOUS | Status: AC
  Administered 2012-08-31: 500 [IU] via INTRAVENOUS
  Filled 2012-08-31: qty 5

## 2012-08-31 NOTE — Progress Notes (Signed)
Tolerated zometa infusion well.

## 2012-09-07 ENCOUNTER — Inpatient Hospital Stay (HOSPITAL_COMMUNITY)
Admission: EM | Admit: 2012-09-07 | Discharge: 2012-09-08 | DRG: 683 | Disposition: A | Discharge status: Home / Self Care | Payer: Managed Care, Other (non HMO) | Attending: Internal Medicine | Admitting: Internal Medicine

## 2012-09-07 ENCOUNTER — Encounter (HOSPITAL_COMMUNITY): Payer: Self-pay | Admitting: *Deleted

## 2012-09-07 DIAGNOSIS — G4733 Obstructive sleep apnea (adult) (pediatric): Secondary | ICD-10-CM | POA: Diagnosis present

## 2012-09-07 DIAGNOSIS — R197 Diarrhea, unspecified: Secondary | ICD-10-CM | POA: Diagnosis present

## 2012-09-07 DIAGNOSIS — Z9884 Bariatric surgery status: Secondary | ICD-10-CM

## 2012-09-07 DIAGNOSIS — Z794 Long term (current) use of insulin: Secondary | ICD-10-CM

## 2012-09-07 DIAGNOSIS — E118 Type 2 diabetes mellitus with unspecified complications: Secondary | ICD-10-CM | POA: Diagnosis present

## 2012-09-07 DIAGNOSIS — Z9989 Dependence on other enabling machines and devices: Secondary | ICD-10-CM | POA: Diagnosis present

## 2012-09-07 DIAGNOSIS — C9000 Multiple myeloma not having achieved remission: Secondary | ICD-10-CM | POA: Diagnosis present

## 2012-09-07 DIAGNOSIS — E1169 Type 2 diabetes mellitus with other specified complication: Secondary | ICD-10-CM | POA: Diagnosis present

## 2012-09-07 DIAGNOSIS — Y92009 Unspecified place in unspecified non-institutional (private) residence as the place of occurrence of the external cause: Secondary | ICD-10-CM

## 2012-09-07 DIAGNOSIS — I519 Heart disease, unspecified: Secondary | ICD-10-CM | POA: Diagnosis present

## 2012-09-07 DIAGNOSIS — I252 Old myocardial infarction: Secondary | ICD-10-CM

## 2012-09-07 DIAGNOSIS — Z87891 Personal history of nicotine dependence: Secondary | ICD-10-CM

## 2012-09-07 DIAGNOSIS — I2510 Atherosclerotic heart disease of native coronary artery without angina pectoris: Secondary | ICD-10-CM | POA: Diagnosis present

## 2012-09-07 DIAGNOSIS — G4730 Sleep apnea, unspecified: Secondary | ICD-10-CM | POA: Diagnosis present

## 2012-09-07 DIAGNOSIS — I872 Venous insufficiency (chronic) (peripheral): Secondary | ICD-10-CM | POA: Diagnosis present

## 2012-09-07 DIAGNOSIS — Z79899 Other long term (current) drug therapy: Secondary | ICD-10-CM

## 2012-09-07 DIAGNOSIS — I129 Hypertensive chronic kidney disease with stage 1 through stage 4 chronic kidney disease, or unspecified chronic kidney disease: Secondary | ICD-10-CM | POA: Diagnosis present

## 2012-09-07 DIAGNOSIS — IMO0001 Reserved for inherently not codable concepts without codable children: Secondary | ICD-10-CM | POA: Diagnosis present

## 2012-09-07 DIAGNOSIS — K529 Noninfective gastroenteritis and colitis, unspecified: Secondary | ICD-10-CM

## 2012-09-07 DIAGNOSIS — Z7982 Long term (current) use of aspirin: Secondary | ICD-10-CM

## 2012-09-07 DIAGNOSIS — K219 Gastro-esophageal reflux disease without esophagitis: Secondary | ICD-10-CM | POA: Diagnosis present

## 2012-09-07 DIAGNOSIS — E162 Hypoglycemia, unspecified: Secondary | ICD-10-CM

## 2012-09-07 DIAGNOSIS — N183 Chronic kidney disease, stage 3 unspecified: Secondary | ICD-10-CM | POA: Diagnosis present

## 2012-09-07 DIAGNOSIS — T451X5A Adverse effect of antineoplastic and immunosuppressive drugs, initial encounter: Secondary | ICD-10-CM | POA: Diagnosis present

## 2012-09-07 DIAGNOSIS — E785 Hyperlipidemia, unspecified: Secondary | ICD-10-CM | POA: Diagnosis present

## 2012-09-07 DIAGNOSIS — I959 Hypotension, unspecified: Secondary | ICD-10-CM

## 2012-09-07 DIAGNOSIS — N179 Acute kidney failure, unspecified: Principal | ICD-10-CM

## 2012-09-07 DIAGNOSIS — E860 Dehydration: Secondary | ICD-10-CM | POA: Diagnosis present

## 2012-09-07 DIAGNOSIS — Z6841 Body Mass Index (BMI) 40.0 and over, adult: Secondary | ICD-10-CM

## 2012-09-07 DIAGNOSIS — Z9861 Coronary angioplasty status: Secondary | ICD-10-CM

## 2012-09-07 DIAGNOSIS — N390 Urinary tract infection, site not specified: Secondary | ICD-10-CM

## 2012-09-07 LAB — COMPREHENSIVE METABOLIC PANEL
ALT: 46 U/L (ref 0–53)
AST: 31 U/L (ref 0–37)
Albumin: 3 g/dL — ABNORMAL LOW (ref 3.5–5.2)
Alkaline Phosphatase: 70 U/L (ref 39–117)
BUN: 22 mg/dL (ref 6–23)
CO2: 19 mEq/L (ref 19–32)
Calcium: 7.5 mg/dL — ABNORMAL LOW (ref 8.4–10.5)
Chloride: 106 mEq/L (ref 96–112)
Creatinine, Ser: 1.64 mg/dL — ABNORMAL HIGH (ref 0.50–1.35)
GFR calc Af Amer: 54 mL/min — ABNORMAL LOW (ref >90)
GFR calc non Af Amer: 46 mL/min — ABNORMAL LOW (ref >90)
Glucose, Bld: 72 mg/dL (ref 70–99)
Potassium: 3.8 mEq/L (ref 3.5–5.1)
Sodium: 135 mEq/L (ref 135–145)
Total Bilirubin: 0.6 mg/dL (ref 0.3–1.2)
Total Protein: 5.8 g/dL — ABNORMAL LOW (ref 6.0–8.3)

## 2012-09-07 LAB — CBC WITH DIFFERENTIAL/PLATELET
Basophils Absolute: 0 10*3/uL (ref 0.0–0.1)
Basophils Relative: 0 % (ref 0–1)
Eosinophils Absolute: 0.3 10*3/uL (ref 0.0–0.7)
Eosinophils Relative: 7 % — ABNORMAL HIGH (ref 0–5)
HCT: 35.3 % — ABNORMAL LOW (ref 39.0–52.0)
Hemoglobin: 11.8 g/dL — ABNORMAL LOW (ref 13.0–17.0)
Lymphocytes Relative: 26 % (ref 12–46)
Lymphs Abs: 1.2 10*3/uL (ref 0.7–4.0)
MCH: 29.9 pg (ref 26.0–34.0)
MCHC: 33.4 g/dL (ref 30.0–36.0)
MCV: 89.4 fL (ref 78.0–100.0)
Monocytes Absolute: 0.4 10*3/uL (ref 0.1–1.0)
Monocytes Relative: 9 % (ref 3–12)
Neutro Abs: 2.6 10*3/uL (ref 1.7–7.7)
Neutrophils Relative %: 58 % (ref 43–77)
Platelets: 135 10*3/uL — ABNORMAL LOW (ref 150–400)
RBC: 3.95 MIL/uL — ABNORMAL LOW (ref 4.22–5.81)
RDW: 15.5 % (ref 11.5–15.5)
WBC: 4.4 10*3/uL (ref 4.0–10.5)

## 2012-09-07 LAB — GLUCOSE, CAPILLARY
Glucose-Capillary: 44 mg/dL — CL (ref 70–99)
Glucose-Capillary: 66 mg/dL — ABNORMAL LOW (ref 70–99)
Glucose-Capillary: 68 mg/dL — ABNORMAL LOW (ref 70–99)
Glucose-Capillary: 75 mg/dL (ref 70–99)
Glucose-Capillary: 78 mg/dL (ref 70–99)
Glucose-Capillary: 85 mg/dL (ref 70–99)
Glucose-Capillary: 87 mg/dL (ref 70–99)

## 2012-09-07 LAB — CLOSTRIDIUM DIFFICILE BY PCR: Toxigenic C. Difficile by PCR: NEGATIVE

## 2012-09-07 LAB — URINALYSIS, ROUTINE W REFLEX MICROSCOPIC
Bilirubin Urine: NEGATIVE
Glucose, UA: NEGATIVE mg/dL
Ketones, ur: NEGATIVE mg/dL
Nitrite: POSITIVE — AB
Protein, ur: 100 mg/dL — AB
Specific Gravity, Urine: >1.03 — ABNORMAL HIGH (ref 1.005–1.030)
Urobilinogen, UA: 0.2 mg/dL (ref 0.0–1.0)
pH: 5.5 (ref 5.0–8.0)

## 2012-09-07 LAB — LIPASE, BLOOD: Lipase: 46 U/L (ref 11–59)

## 2012-09-07 LAB — URINE MICROSCOPIC-ADD ON

## 2012-09-07 MED ORDER — OXYCODONE-ACETAMINOPHEN 5-325 MG PO TABS: 1.5000 | ORAL_TABLET | Freq: Four times a day (QID) | ORAL | Status: DC | PRN

## 2012-09-07 MED ORDER — ONDANSETRON HCL 4 MG/2ML IJ SOLN: 4.0000 mg | Freq: Four times a day (QID) | INTRAMUSCULAR | Status: DC | PRN

## 2012-09-07 MED ORDER — DEXTROSE 5 % IV SOLN
1.0000 g | Freq: Once | INTRAVENOUS | Status: AC
  Administered 2012-09-07: 1 g via INTRAVENOUS
  Filled 2012-09-07: qty 10

## 2012-09-07 MED ORDER — ASPIRIN EC 325 MG PO TBEC
325.0000 mg | DELAYED_RELEASE_TABLET | Freq: Every day | ORAL | Status: DC
  Administered 2012-09-08: 325 mg via ORAL
  Filled 2012-09-07 (×2): qty 1

## 2012-09-07 MED ORDER — PANTOPRAZOLE SODIUM 40 MG PO TBEC
40.0000 mg | DELAYED_RELEASE_TABLET | Freq: Every day | ORAL | Status: DC
  Administered 2012-09-08: 40 mg via ORAL
  Filled 2012-09-07 (×2): qty 1

## 2012-09-07 MED ORDER — ONDANSETRON HCL 4 MG/2ML IJ SOLN
4.0000 mg | Freq: Once | INTRAMUSCULAR | Status: AC
  Administered 2012-09-07: 4 mg via INTRAVENOUS
  Filled 2012-09-07: qty 2

## 2012-09-07 MED ORDER — INSULIN ASPART 100 UNIT/ML ~~LOC~~ SOLN: 0.0000 [IU] | Freq: Three times a day (TID) | SUBCUTANEOUS | Status: DC

## 2012-09-07 MED ORDER — ENOXAPARIN SODIUM 40 MG/0.4ML ~~LOC~~ SOLN
40.0000 mg | SUBCUTANEOUS | Status: DC
  Administered 2012-09-07: 40 mg via SUBCUTANEOUS
  Filled 2012-09-07: qty 0.4

## 2012-09-07 MED ORDER — ALLOPURINOL 300 MG PO TABS
300.0000 mg | ORAL_TABLET | Freq: Every day | ORAL | Status: DC
  Administered 2012-09-08: 300 mg via ORAL
  Filled 2012-09-07 (×2): qty 1

## 2012-09-07 MED ORDER — PANCRELIPASE (LIP-PROT-AMYL) 12000-38000 UNITS PO CPEP
6.0000 | ORAL_CAPSULE | Freq: Three times a day (TID) | ORAL | Status: DC
Start: 2012-09-08 — End: 2012-09-08
  Administered 2012-09-08 (×3): 6 via ORAL
  Filled 2012-09-07 (×4): qty 6

## 2012-09-07 MED ORDER — PANCRELIPASE (LIP-PROT-AMYL) 12000-38000 UNITS PO CPEP
3.0000 | ORAL_CAPSULE | ORAL | Status: DC | PRN
  Administered 2012-09-07: 3 via ORAL

## 2012-09-07 MED ORDER — ACYCLOVIR 400 MG PO TABS
400.0000 mg | ORAL_TABLET | Freq: Every morning | ORAL | Status: DC
  Filled 2012-09-07: qty 1

## 2012-09-07 MED ORDER — ACETAMINOPHEN 650 MG RE SUPP: 650.0000 mg | Freq: Four times a day (QID) | RECTAL | Status: DC | PRN

## 2012-09-07 MED ORDER — ATORVASTATIN CALCIUM 40 MG PO TABS
80.0000 mg | ORAL_TABLET | Freq: Every day | ORAL | Status: DC
  Administered 2012-09-08: 80 mg via ORAL
  Filled 2012-09-07 (×2): qty 2

## 2012-09-07 MED ORDER — SODIUM CHLORIDE 0.9 % IV BOLUS (SEPSIS)
1000.0000 mL | Freq: Once | INTRAVENOUS | Status: AC
  Administered 2012-09-07: 1000 mL via INTRAVENOUS

## 2012-09-07 MED ORDER — POTASSIUM CHLORIDE CRYS ER 20 MEQ PO TBCR
20.0000 meq | EXTENDED_RELEASE_TABLET | Freq: Every day | ORAL | Status: DC
  Administered 2012-09-08: 20 meq via ORAL
  Filled 2012-09-07: qty 1

## 2012-09-07 MED ORDER — DEXTROSE 5 % IV SOLN
1.0000 g | INTRAVENOUS | Status: DC
  Administered 2012-09-08: 1 g via INTRAVENOUS
  Filled 2012-09-07: qty 10

## 2012-09-07 MED ORDER — ACETAMINOPHEN 325 MG PO TABS: 650.0000 mg | ORAL_TABLET | Freq: Four times a day (QID) | ORAL | Status: DC | PRN

## 2012-09-07 MED ORDER — SULFAMETHOXAZOLE-TMP DS 800-160 MG PO TABS
1.0000 | ORAL_TABLET | ORAL | Status: DC
  Administered 2012-09-08: 1 via ORAL
  Filled 2012-09-07: qty 1

## 2012-09-07 MED ORDER — ONDANSETRON HCL 4 MG PO TABS: 4.0000 mg | ORAL_TABLET | Freq: Four times a day (QID) | ORAL | Status: DC | PRN

## 2012-09-07 MED ORDER — TORSEMIDE 20 MG PO TABS: 20.0000 mg | ORAL_TABLET | Freq: Every morning | ORAL | Status: DC

## 2012-09-07 MED ORDER — DEXTROSE-NACL 5-0.45 % IV SOLN
INTRAVENOUS | Status: AC
  Administered 2012-09-07: 20:00:00 via INTRAVENOUS

## 2012-09-07 MED ORDER — KETOROLAC TROMETHAMINE 30 MG/ML IJ SOLN
30.0000 mg | Freq: Once | INTRAMUSCULAR | Status: AC
  Administered 2012-09-07: 30 mg via INTRAVENOUS
  Filled 2012-09-07: qty 1

## 2012-09-07 MED ORDER — CARVEDILOL 3.125 MG PO TABS
3.1250 mg | ORAL_TABLET | Freq: Two times a day (BID) | ORAL | Status: DC
  Administered 2012-09-07: 3.125 mg via ORAL
  Filled 2012-09-07: qty 1

## 2012-09-07 MED ORDER — LORATADINE 10 MG PO TABS
10.0000 mg | ORAL_TABLET | Freq: Every morning | ORAL | Status: DC
  Administered 2012-09-08: 10 mg via ORAL
  Filled 2012-09-07: qty 1

## 2012-09-07 MED ORDER — PANCRELIPASE (LIP-PROT-AMYL) 36000-114000 UNITS PO CPEP: 36000.0000 [IU] | ORAL_CAPSULE | Freq: Three times a day (TID) | ORAL | Status: DC

## 2012-09-07 NOTE — ED Notes (Signed)
Port deaccessed due to questionable infiltration and being positional.  approx 433ml of fluid infused.  edp notified and states will assess port area.  Pt given meal tray per edp request.  Pt alert, sitting up in bed eating at this time.

## 2012-09-07 NOTE — ED Notes (Signed)
Pt in restroom at this time.  Unable to give meds.

## 2012-09-07 NOTE — ED Provider Notes (Signed)
History     This chart was scribed for Veryl Speak, MD, MD by Rhae Lerner, ED Scribe. The patient was seen in room [facility] and the patient's care was started at 1:29 PM.   CSN: [id number]  Arrival date & time 09/07/12  1246      Chief Complaint  Patient presents with  . Weakness     The history is provided by the patient and medical records. No language interpreter was used.   [name] is a 52 y.o. male with hx of ASCVD, DM, chronic kidney disease who presents to the Emergency Department complaining of chills and weakness onset 1 day ago. He mentions having generalized abdominal pain. Pt reports that he had diarrhea but mentions that it is a side effect of multiple myeloma cancer medication (revlimid). He states he was diagnosed 1 year ago with cancer. He reports that he has low BP (93/53 in ED currently). Pt denies dysuria, fever, nausea, vomiting, diarrhea, weakness, cough, SOB and any other pain. He reports that last time he urinated was 1 hour ago.    Past Medical History  Diagnosis Date  . Arteriosclerotic cardiovascular disease (ASCVD)     MI-2000s; stent to the proximal LAD and diagonal in 2001; stress nuclear in 2008-impaired exercise capacity, left ventricular dilatation, moderately to severely depressed EF, apical, inferior and anteroseptal scar  . Sleep apnea   . Hypertension   . 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 Diabetes mellitus     Insulin  . GERD (gastroesophageal reflux disease)   . Pedal edema     Venous insufficiency  . Obesity   . Hyperlipidemia   . Injection site reaction   . Cellulitis of leg     both legs  . Multiple myeloma(203.0) 07/01/2011  . Chronic kidney disease, stage 3, mod decreased GFR     Creatinine of 1.84 in 06/2011 and 1.5 in 07/2011    Past Surgical History  Procedure Laterality Date  . Laparoscopic gastric banding  2006  . Wrist surgery      Left; removal of bone fragment  . Incision and drainage abscess anal    .  Abscess drainage      Scrotal  . Bone marrow biopsy  05/13/11  . Portacath placement  07/07/2011    Procedure: INSERTION PORT-A-CATH;  Surgeon: Scherry Ran, MD;  Location: [facility];  Service: General;  Laterality: N/A;  . Colonoscopy  11/28/2011    Procedure: COLONOSCOPY;  Surgeon: Rogene Houston, MD;  Location: [facility];  Service: Endoscopy;  Laterality: N/A;  930  . Esophagogastroduodenoscopy  01/02/2012    Procedure: ESOPHAGOGASTRODUODENOSCOPY (EGD);  Surgeon: Rogene Houston, MD;  Location: [facility];  Service: Endoscopy;  Laterality: N/A;  100  . Esophageal biopsy  01/02/2012    Procedure: BIOPSY;  Surgeon: Rogene Houston, MD;  Location: [facility];  Service: Endoscopy;  Laterality: N/A;    Family History  Problem Relation Age of Onset  . Heart disease Mother   . Cancer Mother   . Diabetes Father   . Anesthesia problems Neg Hx   . Hypotension Neg Hx   . Malignant hyperthermia Neg Hx   . Pseudochol deficiency Neg Hx   . Arthritis      History  Substance Use Topics  . Smoking status: Former Smoker    Types: Cigarettes, Cigars    Quit date: 05/17/2001  . Smokeless tobacco: Never Used  . Alcohol Use: No  Review of Systems 10 Systems reviewed and all are negative for acute change except as noted in the HPI.   Allergies  Review of patient's allergies indicates no known allergies.  Home Medications   Current Outpatient Rx  Name  Route  Sig  Dispense  Refill  . acyclovir (ZOVIRAX) 400 MG tablet   Oral   Take 1 tablet (400 mg total) by mouth every morning.   30 tablet   3   . allopurinol (ZYLOPRIM) 300 MG tablet   Oral   Take 1 tablet (300 mg total) by mouth daily.   30 tablet   3   . aspirin EC 325 MG tablet   Oral   Take 325 mg by mouth daily.          Marland Kitchen atorvastatin (LIPITOR) 80 MG tablet   Oral   Take 1 tablet (80 mg total) by mouth daily.   90 tablet   3   . Calcium Carbonate-Vit D-Min (CALCIUM 1200 PO)   Oral   Take 1,200 mg  by mouth daily.         . carvedilol (COREG) 3.125 MG tablet   Oral   Take 1 tablet (3.125 mg total) by mouth 2 (two) times daily with a meal.   60 tablet   6   . Cholecalciferol (VITAMIN D) 2000 UNITS CAPS   Oral   Take 1 capsule by mouth every morning.          Marland Kitchen dexamethasone (DECADRON) 4 MG tablet   Oral   Take 5 tablets (20 mg total) by mouth 2 (two) times daily. Every Friday   40 tablet   2   . dexlansoprazole (DEXILANT) 60 MG capsule   Oral   Take 60 mg by mouth every morning.          . diphenoxylate-atropine (LOMOTIL) 2.5-0.025 MG per tablet   Oral   Take 2 tablets by mouth every morning. Max 8 per day         . insulin aspart (NOVOLOG) 100 UNIT/ML injection   Subcutaneous   Inject 5-20 Units into the skin 3 (three) times daily before meals. Sliding scale         . insulin detemir (LEVEMIR) 100 UNIT/ML injection   Subcutaneous   Inject 60 Units into the skin at bedtime.         Marland Kitchen L-Methylfolate-B6-B12 (METANX PO)   Oral   Take 1 tablet by mouth every morning.          Marland Kitchen lenalidomide ([name]) 5 MG capsule   Oral   Take 1 capsule (5 mg total) by mouth daily. Take 5 mg for 7 days on then off for 7 days and repeat   14 capsule   1   . lidocaine-prilocaine (EMLA) cream   Topical   Apply 1 application topically as needed. To port site. Apply 1 hour prior to treatment. Do not rub in. Cover with plastic.         Marland Kitchen loratadine (CLARITIN) 10 MG tablet   Oral   Take 10 mg by mouth every morning.          . Multiple Vitamins-Minerals (MULTIVITAMINS THER. W/MINERALS) TABS   Oral   Take 1 tablet by mouth every morning.          . ondansetron (ZOFRAN) 8 MG tablet   Oral   Take 8 mg by mouth 2 (two) times daily as needed. Nausea/vomiting. Patient to also take this 30 minutes  to 1 hour prior to Velcade injections.         Marland Kitchen oxyCODONE-acetaminophen (PERCOCET) 7.5-325 MG per tablet   Oral   Take 1 tablet by mouth every 6 (six) hours as  needed.   50 tablet   0   . Pancrelipase, Lip-Prot-Amyl, 36000 UNITS CPEP   Oral   Take 36,000-72,000 Units by mouth 3 (three) times daily. Take two with meals and one with snacks         . potassium chloride SA (K-DUR,KLOR-CON) 20 MEQ tablet   Oral   Take 20 mEq by mouth daily. Take 2 tabs daily         . torsemide (DEMADEX) 20 MG tablet   Oral   Take 1 tablet (20 mg total) by mouth every morning.   30 tablet   5   . Zoledronic Acid (ZOMETA IV)   Intravenous   Inject 2 mg into the vein every 28 (twenty-eight) days. To start Jul 11, 2011           BP 93/53  Pulse 73  Temp(Src) 98.7 F (37.1 C) (Oral)  Resp 20  Ht 6\' 3"  (1.905 m)  Wt 340 lb (154.223 kg)  BMI 42.5 kg/m2  SpO2 100%  Physical Exam  Nursing note and vitals reviewed. Constitutional: He is oriented to person, place, and time. He appears well-developed and well-nourished. No distress.  HENT:  Head: Normocephalic and atraumatic.  Eyes: EOM are normal. Pupils are equal, round, and reactive to light.  Neck: Normal range of motion. Neck supple. No tracheal deviation present.  Cardiovascular: Normal rate, regular rhythm and normal heart sounds.   Pulmonary/Chest: Effort normal. No respiratory distress.  Abdominal: Soft.  Obese abdomen. There is generalized tenderness to palpation without rebound or guarding. Bowel sounds are present.   Musculoskeletal: Normal range of motion.  Neurological: He is alert and oriented to person, place, and time.  Skin: Skin is warm and dry.  Psychiatric: He has a normal mood and affect. His behavior is normal.    ED Course  Procedures (including critical care time) DIAGNOSTIC STUDIES: Oxygen Saturation is 100% on room air, normal by my interpretation.    COORDINATION OF CARE: 1:37 PM Discussed ED treatment with pt and pt agrees. , 1:37 PM Ordered:  Medications  sodium chloride 0.9 % bolus 1,000 mL (1,000 mLs Intravenous New Bag/Given 09/07/12 1429)  ketorolac (TORADOL)  30 MG/ML injection 30 mg (30 mg Intravenous Given 09/07/12 1429)  ondansetron (ZOFRAN) injection 4 mg (4 mg Intravenous Given 09/07/12 1429)        Labs Reviewed  CBC WITH DIFFERENTIAL - Abnormal; Notable for the following:    RBC 3.95 (*)    Hemoglobin 11.8 (*)    HCT 35.3 (*)    Platelets 135 (*)    Eosinophils Relative 7 (*)    All other components within normal limits  COMPREHENSIVE METABOLIC PANEL - Abnormal; Notable for the following:    Creatinine, Ser 1.64 (*)    Calcium 7.5 (*)    Total Protein 5.8 (*)    Albumin 3.0 (*)    GFR calc non Af Amer 46 (*)    GFR calc Af Amer 54 (*)    All other components within normal limits  URINALYSIS, ROUTINE W REFLEX MICROSCOPIC - Abnormal; Notable for the following:    Specific Gravity, Urine >1.030 (*)    Hgb urine dipstick MODERATE (*)    Protein, ur 100 (*)    Nitrite POSITIVE (*)  Leukocytes, UA TRACE (*)    All other components within normal limits  URINE MICROSCOPIC-ADD ON - Abnormal; Notable for the following:    Squamous Epithelial / LPF FEW (*)    Bacteria, UA MANY (*)    All other components within normal limits  GLUCOSE, CAPILLARY  LIPASE, BLOOD   No results found.   No diagnosis found.    MDM  The patient presents here with n/v/d for the past few days.  He is diabetic and has a history of multiple myeloma.  Today's workup reveals a uti and his sugar has dropped to 44.  As he has had difficulty tolerating po meds, is intermittently hypoglyemic, I will consult medicine for admission for hydration, iv antibiotics.  Dr. Sarajane Jews called, will see patient.     I personally performed the services described in this documentation, which was scribed in my presence. The recorded information has been reviewed and is accurate.         Veryl Speak, MD 09/07/12 [phone]

## 2012-09-07 NOTE — ED Notes (Addendum)
Diarrhea, chills, feels weak,  abd pain,  No vomiting, no nausea,Nasal congestion.  Rectal area is "irritated"   Advised to come to Er by Dr Drue Stager. Treating him for mult.myeloma

## 2012-09-07 NOTE — ED Notes (Signed)
Pt very diaphoretic stating feels like his sugar has dropped.  cbg 44.  Swallow screen passed, pt given OJ with 2 packets of sugar and 1 regular Coke to drink.

## 2012-09-07 NOTE — H&P (Signed)
History and Physical  [name] [id number] DOB: June 24, 1960 DOA: 09/07/2012  Referring physician: Veryl Speak, MD PCP: [name], MD  Dr Drue Stager  Chief Complaint: Chills  HPI:  52 year old man presented to the emergency department with complaint of chills mild abdominal discomfort and increased diarrhea. Initial evaluation was notable for UTI, mild hypotension and possible acute renal failure.  History obtained from patient at bedside. He has a history of multiple myeloma and currently is on chemotherapy which has caused chronic diarrhea with several episodes per day at baseline. He was feeling well yesterday, ate well and blood sugars were stable in the 100s. Last night he started to have chills, developed whole-body aches and noticed hypoglycemia. Diarrhea worsened with multiple episodes and he developed mild generalized abdominal discomfort. The diarrhea persisted today and increased pace and he had relatively poor oral intake. Of note he was admitted 09/2011 with diarrhea (C. difficile-negative) and hypotension.  In ED  Afebrile, SBP 93 initially, responded to IV fluids. No hypoxia  Hypoglycemia  Several episodes of diarrhea  Treated with NS bolus 1 liter, zofran, Toradol, Rocephin  Because of chills, ongoing diarrhea and immunocompromise state he was referred for admission for treatment of UTI and other acute issues.  Review of Systems:  Negative for fever, visual changes, sore throat, rash, chest pain, SOB, dysuria, bleeding, n/v.  Positive for generalized muscle aches  Past Medical History  Diagnosis Date  . Arteriosclerotic cardiovascular disease (ASCVD)     MI-2000s; stent to the proximal LAD and diagonal in 2001; stress nuclear in 2008-impaired exercise capacity, left ventricular dilatation, moderately to severely depressed EF, apical, inferior and anteroseptal scar  . Sleep apnea   . Hypertension   . 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 Diabetes mellitus      Insulin  . GERD (gastroesophageal reflux disease)   . Pedal edema     Venous insufficiency  . Obesity   . Hyperlipidemia   . Injection site reaction   . Cellulitis of leg     both legs  . Multiple myeloma(203.0) 07/01/2011  . Chronic kidney disease, stage 3, mod decreased GFR     Creatinine of 1.84 in 06/2011 and 1.5 in 07/2011    Past Surgical History  Procedure Laterality Date  . Laparoscopic gastric banding  2006  . Wrist surgery      Left; removal of bone fragment  . Incision and drainage abscess anal    . Abscess drainage      Scrotal  . Bone marrow biopsy  05/13/11  . Portacath placement  07/07/2011    Procedure: INSERTION PORT-A-CATH;  Surgeon: Scherry Ran, MD;  Location: [facility];  Service: General;  Laterality: N/A;  . Colonoscopy  11/28/2011    Procedure: COLONOSCOPY;  Surgeon: Rogene Houston, MD;  Location: [facility];  Service: Endoscopy;  Laterality: N/A;  930  . Esophagogastroduodenoscopy  01/02/2012    Procedure: ESOPHAGOGASTRODUODENOSCOPY (EGD);  Surgeon: Rogene Houston, MD;  Location: [facility];  Service: Endoscopy;  Laterality: N/A;  100  . Esophageal biopsy  01/02/2012    Procedure: BIOPSY;  Surgeon: Rogene Houston, MD;  Location: [facility];  Service: Endoscopy;  Laterality: N/A;    Social History:  reports that he quit smoking about 11 years ago. His smoking use included Cigarettes and Cigars. He smoked 0.00 packs per day. He has never used smokeless tobacco. He reports that he does not drink alcohol or use illicit drugs.  No Known Allergies  Family History  Problem Relation Age of Onset  . Heart disease Mother   . Cancer Mother   . Diabetes Father   . Anesthesia problems Neg Hx   . Hypotension Neg Hx   . Malignant hyperthermia Neg Hx   . Pseudochol deficiency Neg Hx   . Arthritis       Prior to Admission medications   Medication Sig Start Date End Date Taking? Authorizing Provider  acyclovir (ZOVIRAX) 400 MG tablet Take 1 tablet  (400 mg total) by mouth every morning. 08/13/12  Yes Baird Cancer, PA-C  allopurinol (ZYLOPRIM) 300 MG tablet Take 1 tablet (300 mg total) by mouth daily. 08/13/12  Yes Baird Cancer, PA-C  aspirin EC 325 MG tablet Take 325 mg by mouth daily.    Yes Historical Provider, MD  atorvastatin (LIPITOR) 80 MG tablet Take 1 tablet (80 mg total) by mouth daily. 07/30/12  Yes Lendon Colonel, NP  Calcium Carbonate-Vit D-Min (CALCIUM 1200 PO) Take 1,200 mg by mouth daily.   Yes Historical Provider, MD  carvedilol (COREG) 3.125 MG tablet Take 1 tablet (3.125 mg total) by mouth 2 (two) times daily with a meal. 07/21/12  Yes Yehuda Savannah, MD  Cholecalciferol (VITAMIN D) 2000 UNITS CAPS Take 1 capsule by mouth every morning.    Yes Historical Provider, MD  dexamethasone (DECADRON) 4 MG tablet Take 5 tablets (20 mg total) by mouth 2 (two) times daily. Every Friday 07/12/12  Yes Manon Hilding Kefalas, PA-C  dexlansoprazole (DEXILANT) 60 MG capsule Take 60 mg by mouth every morning.    Yes Historical Provider, MD  diphenoxylate-atropine (LOMOTIL) 2.5-0.025 MG per tablet Take 2 tablets by mouth every morning. Max 8 per day 04/06/12  Yes Manon Hilding Kefalas, PA-C  insulin aspart (NOVOLOG) 100 UNIT/ML injection Inject 5-20 Units into the skin 3 (three) times daily before meals. Sliding scale   Yes Historical Provider, MD  insulin detemir (LEVEMIR) 100 UNIT/ML injection Inject 60 Units into the skin at bedtime.   Yes Historical Provider, MD  L-Methylfolate-B6-B12 (METANX PO) Take 1 tablet by mouth every morning.    Yes Historical Provider, MD  lenalidomide ([name]) 5 MG capsule Take 1 capsule (5 mg total) by mouth daily. Take 5 mg for 7 days on then off for 7 days and repeat 08/10/12  Yes Manon Hilding Kefalas, PA-C  lidocaine-prilocaine (EMLA) cream Apply 1 application topically as needed. To port site. Apply 1 hour prior to treatment. Do not rub in. Cover with plastic.   Yes Historical Provider, MD  loratadine (CLARITIN) 10  MG tablet Take 10 mg by mouth every morning.    Yes Historical Provider, MD  Multiple Vitamins-Minerals (MULTIVITAMINS THER. W/MINERALS) TABS Take 1 tablet by mouth every morning.    Yes Historical Provider, MD  ondansetron (ZOFRAN) 8 MG tablet Take 8 mg by mouth 2 (two) times daily as needed. Nausea/vomiting. Patient to also take this 30 minutes to 1 hour prior to Velcade injections.   Yes Historical Provider, MD  oxyCODONE-acetaminophen (PERCOCET) 7.5-325 MG per tablet Take 1 tablet by mouth every 6 (six) hours as needed. 06/16/12  Yes Pieter Partridge, MD  Pancrelipase, Lip-Prot-Amyl, 36000 UNITS CPEP Take [id number] Units by mouth 3 (three) times daily. Take two with meals and one with snacks 03/22/12  Yes Rogene Houston, MD  potassium chloride SA (K-DUR,KLOR-CON) 20 MEQ tablet Take 20 mEq by mouth daily. Take 2 tabs daily 05/31/12  Yes Rogene Houston, MD  sulfamethoxazole-trimethoprim (BACTRIM DS)  800-160 MG per tablet Take 1 tablet by mouth every Monday, Wednesday, and Friday.   Yes Historical Provider, MD  torsemide (DEMADEX) 20 MG tablet Take 1 tablet (20 mg total) by mouth every morning. 05/31/12  Yes Rogene Houston, MD  Zoledronic Acid (ZOMETA IV) Inject 2 mg into the vein every 28 (twenty-eight) days. To start Jul 11, 2011   Yes Historical Provider, MD   Physical Exam: Filed Vitals:   09/07/12 1312 09/07/12 1525 09/07/12 1705  BP: 93/53  103/67  Pulse: 73  60  Temp: 98.7 F (37.1 C) 98.3 F (36.8 C)   TempSrc: Oral    Resp: 20  20  Height: 6\' 3"  (1.905 m)    Weight: 154.223 kg (340 lb)    SpO2: 100%  99%    General:  Examined in the emergency department. Appears calm and comfortable sitting up on a gurney. Eyes: PERRL, normal lids, irises. Wears glasses. ENT: grossly normal hearing, lips & tongue Neck: no LAD, masses or thyromegaly Cardiovascular: RRR, no m/r/g. No LE edema. Telemetry: SR, no arrhythmias  Respiratory: CTA bilaterally, no w/r/r. Normal respiratory  effort. Abdomen: Obese, soft, ntnd Skin: no rash or induration seen o Musculoskeletal: grossly normal tone BUE/BLE Psychiatric: grossly normal mood and affect, speech fluent and appropriate Neurologic: grossly non-focal.  Wt Readings from Last 3 Encounters:  09/07/12 154.223 kg (340 lb)  08/17/12 154.631 kg (340 lb 14.4 oz)  08/04/12 158.759 kg (350 lb)    Labs on Admission:  Basic Metabolic Panel:  Recent Labs Lab 09/07/12 1332  NA 135  K 3.8  CL 106  CO2 19  GLUCOSE 72  BUN 22  CREATININE 1.64*  CALCIUM 7.5*    Liver Function Tests:  Recent Labs Lab 09/07/12 1332  AST 31  ALT 46  ALKPHOS 70  BILITOT 0.6  PROT 5.8*  ALBUMIN 3.0*    Recent Labs Lab 09/07/12 1332  LIPASE 46    CBC:  Recent Labs Lab 09/07/12 1332  WBC 4.4  NEUTROABS 2.6  HGB 11.8*  HCT 35.3*  MCV 89.4  PLT 135*    CBG:  Recent Labs Lab 09/07/12 1325 09/07/12 1525 09/07/12 1619  GLUCAP 75 44* 66*     Principal Problem:   UTI (lower urinary tract infection) Active Problems:   DIABETES MELLITUS, TYPE II, ON INSULIN   Morbid obesity   SLEEP APNEA   Multiple myeloma   Arteriosclerotic cardiovascular disease (ASCVD)   Systolic dysfunction   Hypotension   Acute renal failure   Assessment/Plan 52 year old man with history of multiple myeloma currently on chemotherapy presented with less than 24 hours of chills, generalized body aches and increased diarrhea. His evaluation suggests UTI, hypotension secondary to increased diarrhea rather than sepsis. However he is immunocompromised and had some symptoms of systemic infection. Will admit for inpatient treatment.  1. UTI: Empiric antibiotics. Followup culture. 2. Hypotension: Suspect secondary to volume depletion rather than sepsis. Improved with less than 1 L IV fluids in the emergency department.  3. Diabetes mellitus with hypoglycemia: Likely secondary to acute infection and poor oral intake. Hold Lantus. D5  infusion. 4. Diarrhea acute on chronic: Most likely secondary to chemotherapy. Very minimal abdominal discomfort, nontender exam. No reason to pursue imaging at this point. 5. Possible acute renal failure: IV fluids, reassess in the morning. 6. Multiple myeloma: Currently on chemotherapy. Continue Bactrim, allopurinol, acyclovir 7. Chronic systolic dysfunction: Left ventricular ejection fraction 35-40% by 2-D echocardiogram 07/2011. Monitor on IV fluids. 8. History  of coronary artery disease: Continue atorvastatin, Coreg 9. Obstructive sleep apnea: CPAP.  Code Status: Full code Family Communication: None present Disposition Plan/Anticipated LOS: Admit, 1-2 days  Time spent: 36 minutes  Murray Hodgkins, MD  [facility] Pager [phone] 09/07/2012, 4:56 PM

## 2012-09-08 ENCOUNTER — Other Ambulatory Visit (HOSPITAL_COMMUNITY): Payer: Managed Care, Other (non HMO)

## 2012-09-08 ENCOUNTER — Telehealth (HOSPITAL_COMMUNITY): Payer: Self-pay | Admitting: Dietician

## 2012-09-08 DIAGNOSIS — R197 Diarrhea, unspecified: Secondary | ICD-10-CM

## 2012-09-08 DIAGNOSIS — E860 Dehydration: Secondary | ICD-10-CM

## 2012-09-08 LAB — GLUCOSE, CAPILLARY
Glucose-Capillary: 87 mg/dL (ref 70–99)
Glucose-Capillary: 84 mg/dL (ref 70–99)
Glucose-Capillary: 69 mg/dL — ABNORMAL LOW (ref 70–99)
Glucose-Capillary: 90 mg/dL (ref 70–99)
Glucose-Capillary: 114 mg/dL — ABNORMAL HIGH (ref 70–99)
Glucose-Capillary: 108 mg/dL — ABNORMAL HIGH (ref 70–99)
Glucose-Capillary: 110 mg/dL — ABNORMAL HIGH (ref 70–99)
Glucose-Capillary: 113 mg/dL — ABNORMAL HIGH (ref 70–99)

## 2012-09-08 LAB — CBC
HCT: 37.2 % — ABNORMAL LOW (ref 39.0–52.0)
Hemoglobin: 12.2 g/dL — ABNORMAL LOW (ref 13.0–17.0)
MCH: 29.8 pg (ref 26.0–34.0)
MCHC: 32.8 g/dL (ref 30.0–36.0)
MCV: 91 fL (ref 78.0–100.0)
Platelets: 151 10*3/uL (ref 150–400)
RBC: 4.09 MIL/uL — ABNORMAL LOW (ref 4.22–5.81)
RDW: 15.9 % — ABNORMAL HIGH (ref 11.5–15.5)
WBC: 4.8 10*3/uL (ref 4.0–10.5)

## 2012-09-08 LAB — BASIC METABOLIC PANEL
BUN: 21 mg/dL (ref 6–23)
CO2: 17 mEq/L — ABNORMAL LOW (ref 19–32)
Calcium: 7.4 mg/dL — ABNORMAL LOW (ref 8.4–10.5)
Chloride: 112 mEq/L (ref 96–112)
Creatinine, Ser: 1.59 mg/dL — ABNORMAL HIGH (ref 0.50–1.35)
GFR calc Af Amer: 56 mL/min — ABNORMAL LOW (ref >90)
GFR calc non Af Amer: 48 mL/min — ABNORMAL LOW (ref >90)
Glucose, Bld: 53 mg/dL — ABNORMAL LOW (ref 70–99)
Potassium: 3.1 mEq/L — ABNORMAL LOW (ref 3.5–5.1)
Sodium: 140 mEq/L (ref 135–145)

## 2012-09-08 MED ORDER — ENOXAPARIN SODIUM 80 MG/0.8ML ~~LOC~~ SOLN: 80.0000 mg | SUBCUTANEOUS | Status: DC

## 2012-09-08 MED ORDER — ACYCLOVIR 200 MG PO CAPS
400.0000 mg | ORAL_CAPSULE | Freq: Every day | ORAL | Status: DC
  Administered 2012-09-08: 400 mg via ORAL
  Filled 2012-09-08: qty 2

## 2012-09-08 MED ORDER — LOPERAMIDE HCL 2 MG PO CAPS
2.0000 mg | ORAL_CAPSULE | ORAL | Status: DC | PRN
  Administered 2012-09-08 (×2): 2 mg via ORAL
  Filled 2012-09-08 (×2): qty 1

## 2012-09-08 MED ORDER — CIPROFLOXACIN HCL 500 MG PO TABS: 500.0000 mg | ORAL_TABLET | Freq: Two times a day (BID) | ORAL | Status: DC

## 2012-09-08 MED ORDER — LOPERAMIDE HCL 2 MG PO CAPS: 2.0000 mg | ORAL_CAPSULE | Freq: Four times a day (QID) | ORAL | Status: DC | PRN

## 2012-09-08 MED ORDER — PANCRELIPASE (LIP-PROT-AMYL) 12000-38000 UNITS PO CPEP: 6.0000 | ORAL_CAPSULE | Freq: Three times a day (TID) | ORAL | Status: DC

## 2012-09-08 MED ORDER — INSULIN DETEMIR 100 UNIT/ML ~~LOC~~ SOLN: 40.0000 [IU] | Freq: Every day | SUBCUTANEOUS | Status: DC

## 2012-09-08 MED ORDER — POTASSIUM CHLORIDE CRYS ER 20 MEQ PO TBCR
40.0000 meq | EXTENDED_RELEASE_TABLET | ORAL | Status: AC
  Administered 2012-09-08 (×2): 40 meq via ORAL
  Filled 2012-09-08 (×2): qty 2

## 2012-09-08 MED ORDER — PANCRELIPASE (LIP-PROT-AMYL) 12000-38000 UNITS PO CPEP: 3.0000 | ORAL_CAPSULE | ORAL | Status: DC | PRN

## 2012-09-08 NOTE — Care Management Note (Signed)
    Page 1 of 1   09/08/2012     11:29:29 AM   CARE MANAGEMENT NOTE 09/08/2012  Patient:  [name], [name]   Account Number:  [id number]  Date Initiated:  09/08/2012  Documentation initiated by:  Claretha Cooper  Subjective/Objective Assessment:   Pt admitted from home where he lives with spouse. Spoke with pt and no HH needs identified.     Action/Plan:   Anticipated DC Date:  09/09/2012   Anticipated DC Plan:  [place]  CM consult      Choice offered to / List presented to:             Status of service:  In process, will continue to follow Medicare Important Message given?   (If response is "NO", the following Medicare IM given date fields will be blank) Date Medicare IM given:   Date Additional Medicare IM given:    Discharge Disposition:    Per UR Regulation:    If discussed at Long Length of Stay Meetings, dates discussed:    Comments:  09/08/12 [place]

## 2012-09-08 NOTE — Progress Notes (Signed)
UR Chart Review Completed  

## 2012-09-08 NOTE — Progress Notes (Signed)
D/c instructions reviewed with patient and wife.  Verbalized understanding.   Pt dc'd to home with wife.  Schonewitz, Eulis Canner 09/08/2012

## 2012-09-08 NOTE — Telephone Encounter (Signed)
Received message from inpatient RD at 1438 that pt has diet-related questions. Pt is requesting to speak to this RD, due to being seen previously as an outpatient. Noted that pt is being discharged from [facility] now. Will follow-up via telephone on 09/09/12 or 09/10/12 with pt at home.

## 2012-09-08 NOTE — Discharge Summary (Signed)
Physician Discharge Summary  [name] [id number] DOB: 1961-03-09 DOA: 09/07/2012  PCP: [name], MD  Admit date: 09/07/2012 Discharge date: 09/08/2012  Time spent: 40 minutes  Recommendations for Outpatient Follow-up:  1. Follow up with primary care doctor in 2 weeks  Discharge Diagnoses:  Principal Problem:   UTI (lower urinary tract infection) Active Problems:   DIABETES MELLITUS, TYPE II, ON INSULIN   Morbid obesity   SLEEP APNEA   Multiple myeloma   Arteriosclerotic cardiovascular disease (ASCVD)   Systolic dysfunction   Hypotension   Acute renal failure   Hypoglycemia   Discharge Condition: improved  Diet recommendation: low carb  Filed Weights   09/07/12 1312  Weight: 154.223 kg (340 lb)    History of present illness:  52 year old man presented to the emergency department with complaint of chills mild abdominal discomfort and increased diarrhea. Initial evaluation was notable for UTI, mild hypotension and possible acute renal failure.  History obtained from patient at bedside. He has a history of multiple myeloma and currently is on chemotherapy which has caused chronic diarrhea with several episodes per day at baseline. He was feeling well yesterday, ate well and blood sugars were stable in the 100s. Last night he started to have chills, developed whole-body aches and noticed hypoglycemia. Diarrhea worsened with multiple episodes and he developed mild generalized abdominal discomfort. The diarrhea persisted today and increased pace and he had relatively poor oral intake. Of note he was admitted 09/2011 with diarrhea (C. difficile-negative) and hypotension.  In ED  Afebrile, SBP 93 initially, responded to IV fluids. No hypoxia  Hypoglycemia  Several episodes of diarrhea  Treated with NS bolus 1 liter, zofran, Toradol, Rocephin Because of chills, ongoing diarrhea and immunocompromise state he was referred for admission for treatment of UTI and other acute  issues.   Hospital Course:  This patient was admitted to the hospital with urinary tract infection. He was also having several episodes of diarrhea and was dehydrated. He is a diabetic on insulin and was noted to be hypoglycemic. He was monitored in the hospital overnight. He was started on Rocephin has since improved. He's not had any fevers or chills since admission. His WBC count is normal. He has been transitioned over to oral cipro for empiric coverage. Regarding his diarrhea, his stool tested negative for C. diff. He was started on Imodium with good effect. Diarrhea is likely viral in origin versus a side effect from chemotherapy. Hypoglycemia has since improved. His Levemir was held on admission. He will be restarted at a lower dose. He does not have any nausea or vomiting and is consuming an adequate amount of po.  He is ambulating without difficulty and feels back to baseline. He is asked to followup with his primary care physician. He currently feels improved and is requesting to discharge home.  Procedures:  none  Consultations:  none  Discharge Exam: Filed Vitals:   09/07/12 1705 09/07/12 1920 09/08/12 0419 09/08/12 1441  BP: 103/67 120/78 92/53 95/58   Pulse: 60 60 54 59  Temp:  97.4 F (36.3 C) 97.9 F (36.6 C) 97.2 F (36.2 C)  TempSrc:  Oral Oral Oral  Resp: 20 20 20 20   Height:  6\' 3"  (1.905 m)    Weight:      SpO2: 99% 94% 100% 98%    General: NAD Cardiovascular: S1, S2 RRR Respiratory: CTA B  Discharge Instructions  Discharge Orders   Future Appointments Provider Department Dept Phone   09/28/2012 2:15 PM  [facility] [phone]   10/06/2012 3:30 PM Omer Jack [facility] [phone]   12/20/2012 3:45 PM Rogene Houston, MD [facility] [phone]   Future Orders Complete By Expires     Call MD for:  extreme fatigue  As directed     Call MD for:  persistant dizziness or  light-headedness  As directed     Call MD for:  persistant nausea and vomiting  As directed     Call MD for:  temperature >100.4  As directed     Diet - low sodium heart healthy  As directed     Increase activity slowly  As directed         Medication List    TAKE these medications       acyclovir 400 MG tablet  Commonly known as:  ZOVIRAX  Take 1 tablet (400 mg total) by mouth every morning.     allopurinol 300 MG tablet  Commonly known as:  ZYLOPRIM  Take 1 tablet (300 mg total) by mouth daily.     aspirin EC 325 MG tablet  Take 325 mg by mouth daily.     atorvastatin 80 MG tablet  Commonly known as:  LIPITOR  Take 1 tablet (80 mg total) by mouth daily.     CALCIUM 1200 PO  Take 1,200 mg by mouth daily.     carvedilol 3.125 MG tablet  Commonly known as:  COREG  Take 1 tablet (3.125 mg total) by mouth 2 (two) times daily with a meal.     ciprofloxacin 500 MG tablet  Commonly known as:  CIPRO  Take 1 tablet (500 mg total) by mouth 2 (two) times daily.     dexamethasone 4 MG tablet  Commonly known as:  DECADRON  Take 5 tablets (20 mg total) by mouth 2 (two) times daily. Every Friday     DEXILANT 60 MG capsule  Generic drug:  dexlansoprazole  Take 60 mg by mouth every morning.     diphenoxylate-atropine 2.5-0.025 MG per tablet  Commonly known as:  LOMOTIL  Take 2 tablets by mouth every morning. Max 8 per day     insulin aspart 100 UNIT/ML injection  Commonly known as:  novoLOG  Inject 5-20 Units into the skin 3 (three) times daily before meals. Sliding scale     insulin detemir 100 UNIT/ML injection  Commonly known as:  LEVEMIR  Inject 0.4 mLs (40 Units total) into the skin at bedtime.     lenalidomide 5 MG capsule  Commonly known as:  [name]  Take 1 capsule (5 mg total) by mouth daily. Take 5 mg for 7 days on then off for 7 days and repeat     lidocaine-prilocaine cream  Commonly known as:  EMLA  Apply 1 application topically as needed. To port site.  Apply 1 hour prior to treatment. Do not rub in. Cover with plastic.     Pancrelipase (Lip-Prot-Amyl) 36000 UNITS Cpep  Take [id number] Units by mouth 3 (three) times daily. Take two with meals and one with snacks     lipase/protease/amylase 12000 UNITS Cpep  Commonly known as:  CREON-10/PANCREASE  Take 6 capsules by mouth 3 (three) times daily with meals.     lipase/protease/amylase 12000 UNITS Cpep  Commonly known as:  CREON-10/PANCREASE  Take 3 capsules by mouth as needed (for digestion of snacks).     loperamide 2 MG capsule  Commonly known  as:  IMODIUM  Take 1 capsule (2 mg total) by mouth 4 (four) times daily as needed for diarrhea or loose stools.     loratadine 10 MG tablet  Commonly known as:  CLARITIN  Take 10 mg by mouth every morning.     METANX PO  Take 1 tablet by mouth every morning.     multivitamins ther. w/minerals Tabs  Take 1 tablet by mouth every morning.     ondansetron 8 MG tablet  Commonly known as:  ZOFRAN  Take 8 mg by mouth 2 (two) times daily as needed. Nausea/vomiting. Patient to also take this 30 minutes to 1 hour prior to Velcade injections.     oxyCODONE-acetaminophen 7.5-325 MG per tablet  Commonly known as:  PERCOCET  Take 1 tablet by mouth every 6 (six) hours as needed.     potassium chloride SA 20 MEQ tablet  Commonly known as:  K-DUR,KLOR-CON  Take 20 mEq by mouth daily. Take 2 tabs daily     sulfamethoxazole-trimethoprim 800-160 MG per tablet  Commonly known as:  BACTRIM DS  Take 1 tablet by mouth every Monday, Wednesday, and Friday.     torsemide 20 MG tablet  Commonly known as:  DEMADEX  Take 1 tablet (20 mg total) by mouth every morning.     Vitamin D 2000 UNITS Caps  Take 1 capsule by mouth every morning.     ZOMETA IV  Inject 2 mg into the vein every 28 (twenty-eight) days. To start Jul 11, 2011           Follow-up Information   Follow up with [facility], MD. Schedule an appointment as soon as possible for a  visit in 2 weeks.   Contact information:   [place] [address] [phone]        The results of significant diagnostics from this hospitalization (including imaging, microbiology, ancillary and laboratory) are listed below for reference.    Significant Diagnostic Studies: No results found.  Microbiology: Recent Results (from the past 240 hour(s))  CULTURE, BLOOD (ROUTINE X 2)     Status: None   Collection Time    09/07/12  7:53 PM      Result Value Range Status   Specimen Description BLOOD RIGHT ARM   Final   Special Requests     Final   Value: BOTTLES DRAWN AEROBIC AND ANAEROBIC AEB 6CC ANA 4CC   Culture NO GROWTH 1 DAY   Final   Report Status PENDING   Incomplete  CULTURE, BLOOD (ROUTINE X 2)     Status: None   Collection Time    09/07/12  7:53 PM      Result Value Range Status   Specimen Description BLOOD RIGHT HAND   Final   Special Requests     Final   Value: BOTTLES DRAWN AEROBIC AND ANAEROBIC AEB 5CC ANA 4CC   Culture NO GROWTH 1 DAY   Final   Report Status PENDING   Incomplete  CLOSTRIDIUM DIFFICILE BY PCR     Status: None   Collection Time    09/07/12  9:37 PM      Result Value Range Status   C difficile by pcr NEGATIVE  NEGATIVE Final     Labs: Basic Metabolic Panel:  Recent Labs Lab 09/07/12 1332 09/08/12 0503  NA 135 140  K 3.8 3.1*  CL 106 112  CO2 19 17*  GLUCOSE 72 53*  BUN 22 21  CREATININE  1.64* 1.59*  CALCIUM 7.5* 7.4*   Liver Function Tests:  Recent Labs Lab 09/07/12 1332  AST 31  ALT 46  ALKPHOS 70  BILITOT 0.6  PROT 5.8*  ALBUMIN 3.0*    Recent Labs Lab 09/07/12 1332  LIPASE 46   No results found for this basename: AMMONIA,  in the last 168 hours CBC:  Recent Labs Lab 09/07/12 1332 09/08/12 0503  WBC 4.4 4.8  NEUTROABS 2.6  --   HGB 11.8* 12.2*  HCT 35.3* 37.2*  MCV 89.4 91.0  PLT 135* 151   Cardiac Enzymes: No results found for this basename: CKTOTAL, CKMB, CKMBINDEX,  TROPONINI,  in the last 168 hours BNP: BNP (last 3 results) No results found for this basename: PROBNP,  in the last 8760 hours CBG:  Recent Labs Lab 09/08/12 0802 09/08/12 1004 09/08/12 1150 09/08/12 1357 09/08/12 1604  GLUCAP 90 114* 108* 110* 113*       Signed:  MEMON,JEHANZEB  [facility] 09/08/2012, 4:38 PM

## 2012-09-10 NOTE — Telephone Encounter (Signed)
Called and left message on pt voicemail at [phone].

## 2012-09-13 ENCOUNTER — Other Ambulatory Visit (HOSPITAL_COMMUNITY): Payer: Self-pay | Admitting: Oncology

## 2012-09-13 ENCOUNTER — Telehealth (INDEPENDENT_AMBULATORY_CARE_PROVIDER_SITE_OTHER): Payer: Self-pay | Admitting: *Deleted

## 2012-09-13 DIAGNOSIS — C9000 Multiple myeloma not having achieved remission: Secondary | ICD-10-CM

## 2012-09-13 LAB — CULTURE, BLOOD (ROUTINE X 2)
Culture: NO GROWTH
Culture: NO GROWTH

## 2012-09-13 MED ORDER — LENALIDOMIDE 5 MG PO CAPS: 5.0000 mg | ORAL_CAPSULE | Freq: Every day | ORAL | Status: DC

## 2012-09-13 NOTE — Telephone Encounter (Signed)
LM asking for Tammy to please give him a return call at [phone] or [phone]. He is having a lot of stomach issues.

## 2012-09-14 NOTE — Telephone Encounter (Signed)
Pt has not returned call within 48 hours. Will sign off.

## 2012-09-15 ENCOUNTER — Telehealth (INDEPENDENT_AMBULATORY_CARE_PROVIDER_SITE_OTHER): Payer: Self-pay | Admitting: *Deleted

## 2012-09-15 ENCOUNTER — Telehealth: Payer: Self-pay | Admitting: *Deleted

## 2012-09-15 DIAGNOSIS — R109 Unspecified abdominal pain: Secondary | ICD-10-CM

## 2012-09-15 DIAGNOSIS — K903 Pancreatic steatorrhea: Secondary | ICD-10-CM

## 2012-09-15 NOTE — Telephone Encounter (Signed)
Patient states that he had to go to the hospital last week and was kept for 24 hours. He had a UTI , Glucose and B/P dropped. He says he has been having severe pain across his abd. He drank a soda to help the gas, no relief, he used Gas Relief it helped a little. Today he is feeling uncomfortable across his chest , left side.The pain is not as severe.He was given antibiotics in hospital and given some to take at [place] he has completed.

## 2012-09-15 NOTE — Telephone Encounter (Signed)
Per Dr.Rehman the patient will need to have labs drawn. 

## 2012-09-15 NOTE — Telephone Encounter (Signed)
Received telephone call from patient stating that he has been having chest pain this am and does not wish to be seen in the emergency department.  Encouraged him to seek medical attention, as schedule is full today.  Appointment made for Friday with Jory Sims, NP, however advised him to go to ED if he feels necessary.

## 2012-09-15 NOTE — Telephone Encounter (Signed)
Per Dr.Rehman we will do the following labs on him. LFT's, Amylase, Lipase Patient was called and made aware.

## 2012-09-16 LAB — AMYLASE: Amylase: 195 U/L — ABNORMAL HIGH (ref 0–105)

## 2012-09-16 LAB — LIPASE: Lipase: 148 U/L — ABNORMAL HIGH (ref 0–75)

## 2012-09-16 LAB — HEPATIC FUNCTION PANEL
ALT: 74 U/L — ABNORMAL HIGH (ref 0–53)
AST: 39 U/L — ABNORMAL HIGH (ref 0–37)
Albumin: 3.7 g/dL (ref 3.5–5.2)
Alkaline Phosphatase: 90 U/L (ref 39–117)
Bilirubin, Direct: 0.1 mg/dL (ref 0.0–0.3)
Indirect Bilirubin: 0.3 mg/dL (ref 0.0–0.9)
Total Bilirubin: 0.4 mg/dL (ref 0.3–1.2)
Total Protein: 5.8 g/dL — ABNORMAL LOW (ref 6.0–8.3)

## 2012-09-17 ENCOUNTER — Ambulatory Visit (INDEPENDENT_AMBULATORY_CARE_PROVIDER_SITE_OTHER): Payer: Managed Care, Other (non HMO) | Admitting: Adult Health

## 2012-09-17 ENCOUNTER — Encounter: Payer: Self-pay | Admitting: Adult Health

## 2012-09-17 VITALS — BP 130/64 | HR 71 | Ht 75.0 in | Wt 334.0 lb

## 2012-09-17 DIAGNOSIS — N289 Disorder of kidney and ureter, unspecified: Secondary | ICD-10-CM

## 2012-09-17 DIAGNOSIS — I7090 Unspecified atherosclerosis: Secondary | ICD-10-CM

## 2012-09-17 DIAGNOSIS — I4892 Unspecified atrial flutter: Secondary | ICD-10-CM | POA: Insufficient documentation

## 2012-09-17 DIAGNOSIS — I2510 Atherosclerotic heart disease of native coronary artery without angina pectoris: Secondary | ICD-10-CM

## 2012-09-17 DIAGNOSIS — I519 Heart disease, unspecified: Secondary | ICD-10-CM

## 2012-09-17 DIAGNOSIS — R197 Diarrhea, unspecified: Secondary | ICD-10-CM

## 2012-09-17 DIAGNOSIS — I10 Essential (primary) hypertension: Secondary | ICD-10-CM

## 2012-09-17 DIAGNOSIS — C9000 Multiple myeloma not having achieved remission: Secondary | ICD-10-CM

## 2012-09-17 DIAGNOSIS — R109 Unspecified abdominal pain: Secondary | ICD-10-CM

## 2012-09-17 LAB — BASIC METABOLIC PANEL
BUN: 24 mg/dL — ABNORMAL HIGH (ref 6–23)
CO2: 24 mEq/L (ref 19–32)
Calcium: 8.6 mg/dL (ref 8.4–10.5)
Chloride: 108 mEq/L (ref 96–112)
Creat: 1.82 mg/dL — ABNORMAL HIGH (ref 0.50–1.35)
Glucose, Bld: 209 mg/dL — ABNORMAL HIGH (ref 70–99)
Potassium: 3.9 mEq/L (ref 3.5–5.3)
Sodium: 140 mEq/L (ref 135–145)

## 2012-09-17 LAB — TSH: TSH: 0.561 u[IU]/mL (ref 0.350–4.500)

## 2012-09-17 MED ORDER — POTASSIUM CHLORIDE CRYS ER 20 MEQ PO TBCR: 40.0000 meq | EXTENDED_RELEASE_TABLET | Freq: Two times a day (BID) | ORAL | Status: DC

## 2012-09-17 MED ORDER — RIVAROXABAN 20 MG PO TABS: 20.0000 mg | ORAL_TABLET | Freq: Every day | ORAL | Status: DC

## 2012-09-17 MED ORDER — NITROGLYCERIN 0.4 MG SL SUBL: 0.4000 mg | SUBLINGUAL_TABLET | SUBLINGUAL | Status: DC | PRN

## 2012-09-17 NOTE — Assessment & Plan Note (Signed)
The patient's most recent echocardiogram revealed an EF of 30-35% in March of 2013. There is no evidence of CHF,. He does have some discomfort she describes as pressure. With hypokalemia noted 10 days ago, and continued diarrhea. I will check a BMET to evaluate his status. EKG reveals frequent PVCs

## 2012-09-17 NOTE — Assessment & Plan Note (Signed)
Echocardiogram will be repeated.

## 2012-09-17 NOTE — Assessment & Plan Note (Signed)
I doubt that the epigastric pain is related to cardiac etiology. He continues to have chronic diarrhea with abdominal cramping and pressure, and symptoms of a "hard ache" which has been nagging and worsening. Over a CT scan of his abdomen to evaluate further. He does have some mildly elevated LFTs. A copy of this test will be sent to his primary GI specialist Dr. Melony Overly.

## 2012-09-17 NOTE — Assessment & Plan Note (Signed)
Blood pressure is well-controlled currently will not make any changes in his medication regimen. However he will need to be considered for low-dose ACE inhibitor as he is not on this in the setting of ischemic cardiomyopathy and diabetes.

## 2012-09-17 NOTE — Assessment & Plan Note (Addendum)
EKG on this visit reveals atrial flutter with 2-1 block. Heart rate is controlled at 62 beats per minute. He continues on Coreg 3.125 mg twice a day. I have asked Dr. Lattie Haw to step in 2 C. this patient with me and he has graciously agreed to do so. The patient's medications were reviewed. He also over read the EKG. We will begin him on Xareltol 20 mg by mouth daily, will repeat his echocardiogram. He will followup with Dr. Lattie Haw 2 weeks. Aspirin will be discontinued in the setting of novel anticoagulation therapy, TSH will be ordered.

## 2012-09-17 NOTE — Progress Notes (Deleted)
Name: [name]    DOB: [birth year]  Age: 52 y.o.  MR#: [id number]       PCP:  [name], MD      Insurance: Payor: [employer]  Plan: [employer] MANAGED  Product Type: *No Product type*    CC:    Chief Complaint  Patient presents with  . Hypertension  . Coronary Artery Disease    VS Filed Vitals:   09/17/12 1401  BP: 130/64  Pulse: 71  Height: 6\' 3"  (1.905 m)  Weight: 334 lb (151.501 kg)    Weights Current Weight  09/17/12 334 lb (151.501 kg)  09/07/12 340 lb (154.223 kg)  08/17/12 340 lb 14.4 oz (154.631 kg)    Blood Pressure  BP Readings from Last 3 Encounters:  09/17/12 130/64  09/08/12 95/58  08/31/12 129/75     Admit date:  (Not on file) Last encounter with RMR:  08/04/2012   Allergy Review of patient's allergies indicates no known allergies.  Current Outpatient Prescriptions  Medication Sig Dispense Refill  . acyclovir (ZOVIRAX) 400 MG tablet Take 1 tablet (400 mg total) by mouth every morning.  30 tablet  3  . allopurinol (ZYLOPRIM) 300 MG tablet Take 1 tablet (300 mg total) by mouth daily.  30 tablet  3  . aspirin EC 325 MG tablet Take 325 mg by mouth daily.       Marland Kitchen atorvastatin (LIPITOR) 80 MG tablet Take 1 tablet (80 mg total) by mouth daily.  90 tablet  3  . Calcium Carbonate-Vit D-Min (CALCIUM 1200 PO) Take 1,200 mg by mouth daily.      . carvedilol (COREG) 3.125 MG tablet Take 1 tablet (3.125 mg total) by mouth 2 (two) times daily with a meal.  60 tablet  6  . Cholecalciferol (VITAMIN D) 2000 UNITS CAPS Take 1 capsule by mouth every morning.       Marland Kitchen dexamethasone (DECADRON) 4 MG tablet Take 5 tablets (20 mg total) by mouth 2 (two) times daily. Every Friday  40 tablet  2  . dexlansoprazole (DEXILANT) 60 MG capsule Take 60 mg by mouth every morning.       . diphenoxylate-atropine (LOMOTIL) 2.5-0.025 MG per tablet Take 2 tablets by mouth every morning. Max 8 per day      . insulin aspart (NOVOLOG) 100 UNIT/ML injection Inject 5-20 Units into the skin 3 (three)  times daily before meals. Sliding scale      . insulin detemir (LEVEMIR) 100 UNIT/ML injection Inject 0.4 mLs (40 Units total) into the skin at bedtime.  10 mL    . lenalidomide ([name]) 5 MG capsule Take 1 capsule (5 mg total) by mouth daily. Take 5 mg for 7 days on then off for 7 days and repeat  14 capsule  0  . lidocaine-prilocaine (EMLA) cream Apply 1 application topically as needed. To port site. Apply 1 hour prior to treatment. Do not rub in. Cover with plastic.      Marland Kitchen lipase/protease/amylase (CREON-10/PANCREASE) 12000 UNITS CPEP Take 6 capsules by mouth 3 (three) times daily with meals.  270 capsule    . lipase/protease/amylase (CREON-10/PANCREASE) 12000 UNITS CPEP Take 3 capsules by mouth as needed (for digestion of snacks).  270 capsule    . loperamide (IMODIUM) 2 MG capsule Take 1 capsule (2 mg total) by mouth 4 (four) times daily as needed for diarrhea or loose stools.  30 capsule  0  . loratadine (CLARITIN) 10 MG tablet Take 10 mg by mouth every morning.       Marland Kitchen  Multiple Vitamins-Minerals (MULTIVITAMINS THER. W/MINERALS) TABS Take 1 tablet by mouth every morning.       . ondansetron (ZOFRAN) 8 MG tablet Take 8 mg by mouth 2 (two) times daily as needed. Nausea/vomiting. Patient to also take this 30 minutes to 1 hour prior to Velcade injections.      Marland Kitchen oxyCODONE-acetaminophen (PERCOCET) 7.5-325 MG per tablet Take 1 tablet by mouth every 6 (six) hours as needed.  50 tablet  0  . Pancrelipase, Lip-Prot-Amyl, 36000 UNITS CPEP Take [id number] Units by mouth 3 (three) times daily. Take two with meals and one with snacks      . potassium chloride SA (K-DUR,KLOR-CON) 20 MEQ tablet Take 20 mEq by mouth daily. Take 2 tabs daily      . sulfamethoxazole-trimethoprim (BACTRIM DS) 800-160 MG per tablet Take 1 tablet by mouth every Monday, Wednesday, and Friday.      . torsemide (DEMADEX) 20 MG tablet Take 1 tablet (20 mg total) by mouth every morning.  30 tablet  5  . Zoledronic Acid (ZOMETA IV)  Inject 2 mg into the vein every 28 (twenty-eight) days. To start Jul 11, 2011       No current facility-administered medications for this visit.    Discontinued Meds:    Medications Discontinued During This Encounter  Medication Reason  . ciprofloxacin (CIPRO) 500 MG tablet Error  . L-Methylfolate-B6-B12 (METANX PO) Error    Patient Active Problem List  Diagnosis  . DIABETES MELLITUS, TYPE II, ON INSULIN  . Morbid obesity  . SLEEP APNEA  . Multiple myeloma  . Arteriosclerotic cardiovascular disease (ASCVD)  . Hypertension  . Hyperlipidemia  . Acute on chronic renal failure  . Diarrhea  . Orthostasis  . Edema of both legs  . Systolic dysfunction  . Dehydration  . Arthritis, shoulder region  . DDD (degenerative disc disease), cervical  . Pain in joint, shoulder region  . Muscle weakness (generalized)  . UTI (lower urinary tract infection)  . Hypotension  . Acute renal failure  . Hypoglycemia    LABS    Component Value Date/Time   NA 140 09/08/2012 0503   NA 135 09/07/2012 1332   NA 138 08/31/2012 1440   K 3.1* 09/08/2012 0503   K 3.8 09/07/2012 1332   K 3.5 08/31/2012 1440   CL 112 09/08/2012 0503   CL 106 09/07/2012 1332   CL 107 08/31/2012 1440   CO2 17* 09/08/2012 0503   CO2 19 09/07/2012 1332   CO2 21 08/31/2012 1440   GLUCOSE 53* 09/08/2012 0503   GLUCOSE 72 09/07/2012 1332   GLUCOSE 115* 08/31/2012 1440   BUN 21 09/08/2012 0503   BUN 22 09/07/2012 1332   BUN 21 08/31/2012 1440   CREATININE 1.59* 09/08/2012 0503   CREATININE 1.64* 09/07/2012 1332   CREATININE 1.48* 08/31/2012 1440   CREATININE 1.60* 10/20/2011 1758   CALCIUM 7.4* 09/08/2012 0503   CALCIUM 7.5* 09/07/2012 1332   CALCIUM 8.7 08/31/2012 1440   GFRNONAA 48* 09/08/2012 0503   GFRNONAA 46* 09/07/2012 1332   GFRNONAA 52* 08/31/2012 1440   GFRAA 56* 09/08/2012 0503   GFRAA 54* 09/07/2012 1332   GFRAA 61* 08/31/2012 1440   CMP     Component Value Date/Time   NA 140 09/08/2012 0503   K 3.1* 09/08/2012 0503   CL 112 09/08/2012 0503   CO2 17*  09/08/2012 0503   GLUCOSE 53* 09/08/2012 0503   BUN 21 09/08/2012 0503   CREATININE 1.59* 09/08/2012 0503  CREATININE 1.60* 10/20/2011 1758   CALCIUM 7.4* 09/08/2012 0503   PROT 5.8* 09/15/2012 1138   ALBUMIN 3.7 09/15/2012 1138   AST 39* 09/15/2012 1138   ALT 74* 09/15/2012 1138   ALKPHOS 90 09/15/2012 1138   BILITOT 0.4 09/15/2012 1138   GFRNONAA 48* 09/08/2012 0503   GFRAA 56* 09/08/2012 0503       Component Value Date/Time   WBC 4.8 09/08/2012 0503   WBC 4.4 09/07/2012 1332   WBC 7.7 08/03/2012 1520   HGB 12.2* 09/08/2012 0503   HGB 11.8* 09/07/2012 1332   HGB 10.9* 08/03/2012 1520   HCT 37.2* 09/08/2012 0503   HCT 35.3* 09/07/2012 1332   HCT 33.4* 08/03/2012 1520   MCV 91.0 09/08/2012 0503   MCV 89.4 09/07/2012 1332   MCV 92.0 08/03/2012 1520    Lipid Panel     Component Value Date/Time   CHOL 158 07/18/2010 1724   TRIG 173 07/18/2010 1724   HDL 33 07/18/2010 1724   LDLCALC 90 07/18/2010 1724    ABG No results found for this basename: phart, pco2, pco2art, po2, po2art, hco3, tco2, acidbasedef, o2sat     Lab Results  Component Value Date   TSH 0.822 09/24/2011   BNP (last 3 results) No results found for this basename: PROBNP,  in the last 8760 hours Cardiac Panel (last 3 results) No results found for this basename: CKTOTAL, CKMB, TROPONINI, RELINDX,  in the last 72 hours  Iron/TIBC/Ferritin No results found for this basename: iron, tibc, ferritin     EKG Orders placed in visit on 09/17/12  . EKG 12-LEAD     Prior Assessment and Plan Problem List as of 09/17/2012     ICD-9-CM   DIABETES MELLITUS, TYPE II, ON INSULIN   Last Assessment & Plan   07/28/2011 Office Visit Edited 08/03/2011  8:19 AM by Yehuda Savannah, MD     Diabetic control has been worse since treatment with glucocorticoids initiated.  I suggest allowing somewhat less than optimal control during treatment for multiple myeloma and subsequently aiming for a hemoglobin A1c level of 7.    Morbid obesity   Last Assessment & Plan    07/28/2011 Office Visit Written 07/28/2011  1:11 PM by Yehuda Savannah, MD     Patient is congratulated on a drop in weight loss and encouraged to continue to be as active as possible and to limit caloric intake.      SLEEP APNEA   Multiple myeloma   Arteriosclerotic cardiovascular disease (ASCVD)   Last Assessment & Plan   08/04/2012 Office Visit Written 08/04/2012  3:13 PM by Lendon Colonel, NP     He offers no complaints at this time. I will repeat his echocardiogram in 4 months post chemotherapy cessation. Last echocardiogram was completed in March 2013, during ongoing treatment for multiple myeloma. He denies any cardiac symptoms, fluid retention occurred during cellulitis with torsemide adjustment. He can probably go back down to the 20 mg once a day now from 40 mg daily as I am not seeing any evidence of fluid retention. He is due to followup with Dr. Abran Duke and Dr.Rehman this week. Recent labs have been drawn, we will defer to them for any other changes in his medications.    Hypertension   Last Assessment & Plan   08/04/2012 Office Visit Written 08/04/2012  3:15 PM by Lendon Colonel, NP     Blood pressure is well-controlled currently. We will not make any changes in his  medicines at this time. He will continue on carvedilol 3.125 mg twice a day, with potassium replacement. He is also to continue torsemide, with adjustments per Dr. Melony Overly who has increased the dose. I think that he will be able to be decreased on assisted 20 mg daily.    Hyperlipidemia   Last Assessment & Plan   07/28/2011 Office Visit Written 07/28/2011  1:10 PM by Yehuda Savannah, MD     Lipid profiles within the past year have been reasonable.  Current medication will be continued.    Acute on chronic renal failure   Diarrhea   Orthostasis   Edema of both legs   Systolic dysfunction   Last Assessment & Plan   08/04/2012 Office Visit Written 08/04/2012  3:16 PM by Lendon Colonel, NP     We will repeat  echocardiogram in 4 months and see him again in 6 months. I will old that we will see an improvement in his LV function. If not up titration are of carvedilol will be considered. Other this chemotherapy is completed we will give his heart time to recover and will make further recommendations in 6 months. He knows to call should he have any symptoms of CHF. I have reviewed these symptoms with him and he verbalizes understanding.    Dehydration   Arthritis, shoulder region   DDD (degenerative disc disease), cervical   Pain in joint, shoulder region   Muscle weakness (generalized)   UTI (lower urinary tract infection)   Hypotension   Acute renal failure   Hypoglycemia       Imaging: No results found.

## 2012-09-17 NOTE — Progress Notes (Signed)
HPI: [name], is a 52 year old patient of Dr. Jacqulyn Ducking we are following for ongoing assessment and management of nonischemic cardiomyopathy with an EF of 3035% per echo in March of 2013, CAD, hypertension, known history of multiple myeloma. He was last seen in the office in March of 2004 with complaints of lower extremity edema and cellulitis of the lower extremities. Patient is also being followed by Dr. Drue Stager. On last visit his Lasix was decreased from 40 mg daily to 20 mg daily. Old labs were completed on 09/08/2012 revealing a sodium 140, potassium of 3.1, creatinine 1.59.   He comes today complaining of some epigastric pain radiating from his abdomen. He has had several days of diarrhea. The patient was recently treated for a UTI and dehydration during the last week. He was seen in the ER given IV fluids and placed on Cipro. UTI symptoms have dissipated but he is having worsening abdominal pain which he described as a hard ache radiating to the left abdomen. Review of the labs as above, with hypokalemia noted. Was given some potassium replacement but has not had followup labs completed since that time. He denies any dyspnea on exertion, diaphoresis. He is now complaining of left chest discomfort. He has a history of stent to LAD in the early 2000. EKG is showing atrial flutter which is new for him.  No Known Allergies  Current Outpatient Prescriptions  Medication Sig Dispense Refill  . acyclovir (ZOVIRAX) 400 MG tablet Take 1 tablet (400 mg total) by mouth every morning.  30 tablet  3  . allopurinol (ZYLOPRIM) 300 MG tablet Take 1 tablet (300 mg total) by mouth daily.  30 tablet  3  . aspirin EC 325 MG tablet Take 325 mg by mouth daily.       Marland Kitchen atorvastatin (LIPITOR) 80 MG tablet Take 1 tablet (80 mg total) by mouth daily.  90 tablet  3  . Calcium Carbonate-Vit D-Min (CALCIUM 1200 PO) Take 1,200 mg by mouth daily.      . carvedilol (COREG) 3.125 MG tablet Take 1 tablet (3.125 mg  total) by mouth 2 (two) times daily with a meal.  60 tablet  6  . Cholecalciferol (VITAMIN D) 2000 UNITS CAPS Take 1 capsule by mouth every morning.       Marland Kitchen dexamethasone (DECADRON) 4 MG tablet Take 5 tablets (20 mg total) by mouth 2 (two) times daily. Every Friday  40 tablet  2  . dexlansoprazole (DEXILANT) 60 MG capsule Take 60 mg by mouth every morning.       . diphenoxylate-atropine (LOMOTIL) 2.5-0.025 MG per tablet Take 2 tablets by mouth every morning. Max 8 per day      . insulin aspart (NOVOLOG) 100 UNIT/ML injection Inject 5-20 Units into the skin 3 (three) times daily before meals. Sliding scale      . insulin detemir (LEVEMIR) 100 UNIT/ML injection Inject 0.4 mLs (40 Units total) into the skin at bedtime.  10 mL    . lenalidomide ([name]) 5 MG capsule Take 1 capsule (5 mg total) by mouth daily. Take 5 mg for 7 days on then off for 7 days and repeat  14 capsule  0  . lidocaine-prilocaine (EMLA) cream Apply 1 application topically as needed. To port site. Apply 1 hour prior to treatment. Do not rub in. Cover with plastic.      Marland Kitchen lipase/protease/amylase (CREON-10/PANCREASE) 12000 UNITS CPEP Take 6 capsules by mouth 3 (three) times daily with meals.  270 capsule    .  lipase/protease/amylase (CREON-10/PANCREASE) 12000 UNITS CPEP Take 3 capsules by mouth as needed (for digestion of snacks).  270 capsule    . loperamide (IMODIUM) 2 MG capsule Take 1 capsule (2 mg total) by mouth 4 (four) times daily as needed for diarrhea or loose stools.  30 capsule  0  . loratadine (CLARITIN) 10 MG tablet Take 10 mg by mouth every morning.       . Multiple Vitamins-Minerals (MULTIVITAMINS THER. W/MINERALS) TABS Take 1 tablet by mouth every morning.       . ondansetron (ZOFRAN) 8 MG tablet Take 8 mg by mouth 2 (two) times daily as needed. Nausea/vomiting. Patient to also take this 30 minutes to 1 hour prior to Velcade injections.      Marland Kitchen oxyCODONE-acetaminophen (PERCOCET) 7.5-325 MG per tablet Take 1 tablet by  mouth every 6 (six) hours as needed.  50 tablet  0  . Pancrelipase, Lip-Prot-Amyl, 36000 UNITS CPEP Take [id number] Units by mouth 3 (three) times daily. Take two with meals and one with snacks      . potassium chloride SA (K-DUR,KLOR-CON) 20 MEQ tablet Take 20 mEq by mouth daily. Take 2 tabs daily      . sulfamethoxazole-trimethoprim (BACTRIM DS) 800-160 MG per tablet Take 1 tablet by mouth every Monday, Wednesday, and Friday.      . torsemide (DEMADEX) 20 MG tablet Take 1 tablet (20 mg total) by mouth every morning.  30 tablet  5  . Zoledronic Acid (ZOMETA IV) Inject 2 mg into the vein every 28 (twenty-eight) days. To start Jul 11, 2011       No current facility-administered medications for this visit.    Past Medical History  Diagnosis Date  . Arteriosclerotic cardiovascular disease (ASCVD)     MI-2000s; stent to the proximal LAD and diagonal in 2001; stress nuclear in 2008-impaired exercise capacity, left ventricular dilatation, moderately to severely depressed EF, apical, inferior and anteroseptal scar  . Sleep apnea   . Hypertension   . 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 Diabetes mellitus     Insulin  . GERD (gastroesophageal reflux disease)   . Pedal edema     Venous insufficiency  . Obesity   . Hyperlipidemia   . Injection site reaction   . Cellulitis of leg     both legs  . Multiple myeloma(203.0) 07/01/2011  . Chronic kidney disease, stage 3, mod decreased GFR     Creatinine of 1.84 in 06/2011 and 1.5 in 07/2011    Past Surgical History  Procedure Laterality Date  . Laparoscopic gastric banding  2006    has been removed  . Wrist surgery      Left; removal of bone fragment  . Incision and drainage abscess anal    . Abscess drainage      Scrotal  . Bone marrow biopsy  05/13/11  . Portacath placement  07/07/2011    Procedure: INSERTION PORT-A-CATH;  Surgeon: Scherry Ran, MD;  Location: [facility];  Service: General;  Laterality: N/A;  . Colonoscopy  11/28/2011     Procedure: COLONOSCOPY;  Surgeon: Rogene Houston, MD;  Location: [facility];  Service: Endoscopy;  Laterality: N/A;  930  . Esophagogastroduodenoscopy  01/02/2012    Procedure: ESOPHAGOGASTRODUODENOSCOPY (EGD);  Surgeon: Rogene Houston, MD;  Location: [facility];  Service: Endoscopy;  Laterality: N/A;  100  . Esophageal biopsy  01/02/2012    Procedure: BIOPSY;  Surgeon: Rogene Houston, MD;  Location: [facility];  Service: Endoscopy;  Laterality: N/A;    [id number] of systems complete and found to be negative unless listed above  PHYSICAL EXAM BP 130/64  Pulse 71  Ht 6\' 3"  (1.905 m)  Wt 334 lb (151.501 kg)  BMI 41.75 kg/m2  General: Well developed, well nourished, in no acute distress Head: Eyes PERRLA, No xanthomas.   Normal cephalic and atramatic  Lungs: Clear bilaterally to auscultation and percussion. Heart: HRIR S1 S2, without MRG.  Pulses are 2+ & equal.            No carotid bruit. No JVD.  No abdominal bruits. No femoral bruits. Abdomen: Bowel sounds are positive,hyperactive  abdomen soft with tenderness across the upper abdomen, and midgastric area. without masses or                  Hernia's noted. Msk:  Back normal, normal gait. Normal strength and tone for age. Extremities: No clubbing, cyanosis or edema.  DP +1 Neuro: Alert and oriented X 3. Psych:  Good affect, responds appropriately  EKG: SR with 1 st degree AV block, frequent PVC's,  ASSESSMENT AND PLAN

## 2012-09-17 NOTE — Patient Instructions (Addendum)
Your physician recommends that you schedule a follow-up appointment in: 2-3 WEEKS WITH MD RR ONLY   Your physician has recommended you make the following change in your medication:   1) INCREASE POTASSIUM 40MEQ TWICE DAILY 2) START USING NITRO SUBLINGUAL TABLETS 0.4MG  The proper use and anticipated side effects of nitroglycerine has been carefully explained.  If a single episode of chest pain is not relieved by one tablet, the patient will try another within 5 minutes; and if this doesn't relieve the pain, the patient is instructed to call 911 for transportation to an emergency department. 3) START XARELTO 20MG  TAKE EVERY EVENING WITH MEAL  Your physician recommends that you return for lab work in: TODAY [id number] [place] has requested that you have an echocardiogram. Echocardiography is a painless test that uses sound waves to create images of your heart. It provides your doctor with information about the size and shape of your heart and how well your heart's chambers and valves are working. This procedure takes approximately one hour. There are no restrictions for this procedure.

## 2012-09-19 ENCOUNTER — Encounter (HOSPITAL_COMMUNITY): Payer: Self-pay | Admitting: *Deleted

## 2012-09-19 ENCOUNTER — Emergency Department (HOSPITAL_COMMUNITY): Payer: Managed Care, Other (non HMO)

## 2012-09-19 ENCOUNTER — Inpatient Hospital Stay (HOSPITAL_COMMUNITY)
Admission: EM | Admit: 2012-09-19 | Discharge: 2012-09-20 | DRG: 392 | Disposition: A | Discharge status: Home / Self Care | Payer: Managed Care, Other (non HMO) | Attending: Internal Medicine | Admitting: Internal Medicine

## 2012-09-19 DIAGNOSIS — I2510 Atherosclerotic heart disease of native coronary artery without angina pectoris: Secondary | ICD-10-CM

## 2012-09-19 DIAGNOSIS — E119 Type 2 diabetes mellitus without complications: Secondary | ICD-10-CM

## 2012-09-19 DIAGNOSIS — Z833 Family history of diabetes mellitus: Secondary | ICD-10-CM

## 2012-09-19 DIAGNOSIS — Z794 Long term (current) use of insulin: Secondary | ICD-10-CM | POA: Diagnosis present

## 2012-09-19 DIAGNOSIS — I4892 Unspecified atrial flutter: Secondary | ICD-10-CM

## 2012-09-19 DIAGNOSIS — Z8249 Family history of ischemic heart disease and other diseases of the circulatory system: Secondary | ICD-10-CM

## 2012-09-19 DIAGNOSIS — Z79899 Other long term (current) drug therapy: Secondary | ICD-10-CM

## 2012-09-19 DIAGNOSIS — K259 Gastric ulcer, unspecified as acute or chronic, without hemorrhage or perforation: Secondary | ICD-10-CM

## 2012-09-19 DIAGNOSIS — R600 Localized edema: Secondary | ICD-10-CM

## 2012-09-19 DIAGNOSIS — Z87891 Personal history of nicotine dependence: Secondary | ICD-10-CM

## 2012-09-19 DIAGNOSIS — R1013 Epigastric pain: Secondary | ICD-10-CM | POA: Diagnosis present

## 2012-09-19 DIAGNOSIS — M6281 Muscle weakness (generalized): Secondary | ICD-10-CM

## 2012-09-19 DIAGNOSIS — Z9221 Personal history of antineoplastic chemotherapy: Secondary | ICD-10-CM

## 2012-09-19 DIAGNOSIS — N183 Chronic kidney disease, stage 3 unspecified: Secondary | ICD-10-CM | POA: Diagnosis present

## 2012-09-19 DIAGNOSIS — E1169 Type 2 diabetes mellitus with other specified complication: Secondary | ICD-10-CM | POA: Diagnosis present

## 2012-09-19 DIAGNOSIS — E860 Dehydration: Secondary | ICD-10-CM

## 2012-09-19 DIAGNOSIS — G4730 Sleep apnea, unspecified: Secondary | ICD-10-CM

## 2012-09-19 DIAGNOSIS — I428 Other cardiomyopathies: Secondary | ICD-10-CM | POA: Diagnosis present

## 2012-09-19 DIAGNOSIS — N179 Acute kidney failure, unspecified: Secondary | ICD-10-CM

## 2012-09-19 DIAGNOSIS — M19019 Primary osteoarthritis, unspecified shoulder: Secondary | ICD-10-CM

## 2012-09-19 DIAGNOSIS — Z809 Family history of malignant neoplasm, unspecified: Secondary | ICD-10-CM

## 2012-09-19 DIAGNOSIS — I872 Venous insufficiency (chronic) (peripheral): Secondary | ICD-10-CM | POA: Diagnosis present

## 2012-09-19 DIAGNOSIS — E785 Hyperlipidemia, unspecified: Secondary | ICD-10-CM

## 2012-09-19 DIAGNOSIS — IMO0002 Reserved for concepts with insufficient information to code with codable children: Secondary | ICD-10-CM

## 2012-09-19 DIAGNOSIS — Z6841 Body Mass Index (BMI) 40.0 and over, adult: Secondary | ICD-10-CM

## 2012-09-19 DIAGNOSIS — C9000 Multiple myeloma not having achieved remission: Secondary | ICD-10-CM | POA: Diagnosis present

## 2012-09-19 DIAGNOSIS — M5030 Other cervical disc degeneration, unspecified cervical region: Secondary | ICD-10-CM

## 2012-09-19 DIAGNOSIS — I951 Orthostatic hypotension: Secondary | ICD-10-CM

## 2012-09-19 DIAGNOSIS — N189 Chronic kidney disease, unspecified: Secondary | ICD-10-CM

## 2012-09-19 DIAGNOSIS — R197 Diarrhea, unspecified: Secondary | ICD-10-CM | POA: Diagnosis present

## 2012-09-19 DIAGNOSIS — I10 Essential (primary) hypertension: Secondary | ICD-10-CM | POA: Diagnosis present

## 2012-09-19 DIAGNOSIS — E118 Type 2 diabetes mellitus with unspecified complications: Secondary | ICD-10-CM | POA: Diagnosis present

## 2012-09-19 DIAGNOSIS — K2100 Gastro-esophageal reflux disease with esophagitis, without bleeding: Secondary | ICD-10-CM | POA: Diagnosis present

## 2012-09-19 DIAGNOSIS — IMO0001 Reserved for inherently not codable concepts without codable children: Secondary | ICD-10-CM | POA: Diagnosis present

## 2012-09-19 DIAGNOSIS — I129 Hypertensive chronic kidney disease with stage 1 through stage 4 chronic kidney disease, or unspecified chronic kidney disease: Secondary | ICD-10-CM | POA: Diagnosis present

## 2012-09-19 DIAGNOSIS — K449 Diaphragmatic hernia without obstruction or gangrene: Secondary | ICD-10-CM | POA: Diagnosis present

## 2012-09-19 DIAGNOSIS — I519 Heart disease, unspecified: Secondary | ICD-10-CM

## 2012-09-19 DIAGNOSIS — I959 Hypotension, unspecified: Secondary | ICD-10-CM

## 2012-09-19 DIAGNOSIS — E162 Hypoglycemia, unspecified: Secondary | ICD-10-CM

## 2012-09-19 DIAGNOSIS — N390 Urinary tract infection, site not specified: Secondary | ICD-10-CM

## 2012-09-19 HISTORY — DX: Unspecified atrial flutter: I48.92

## 2012-09-19 HISTORY — DX: Noninfective gastroenteritis and colitis, unspecified: K52.9

## 2012-09-19 LAB — CBC WITH DIFFERENTIAL/PLATELET
Basophils Absolute: 0 10*3/uL (ref 0.0–0.1)
Basophils Relative: 0 % (ref 0–1)
Eosinophils Absolute: 0.2 10*3/uL (ref 0.0–0.7)
Eosinophils Relative: 3 % (ref 0–5)
HCT: 37.5 % — ABNORMAL LOW (ref 39.0–52.0)
Hemoglobin: 12.6 g/dL — ABNORMAL LOW (ref 13.0–17.0)
Lymphocytes Relative: 15 % (ref 12–46)
Lymphs Abs: 1.2 10*3/uL (ref 0.7–4.0)
MCH: 29.5 pg (ref 26.0–34.0)
MCHC: 33.6 g/dL (ref 30.0–36.0)
MCV: 87.8 fL (ref 78.0–100.0)
Monocytes Absolute: 0.7 10*3/uL (ref 0.1–1.0)
Monocytes Relative: 9 % (ref 3–12)
Neutro Abs: 6 10*3/uL (ref 1.7–7.7)
Neutrophils Relative %: 73 % (ref 43–77)
Platelets: 183 10*3/uL (ref 150–400)
RBC: 4.27 MIL/uL (ref 4.22–5.81)
RDW: 15.4 % (ref 11.5–15.5)
WBC: 8.2 10*3/uL (ref 4.0–10.5)

## 2012-09-19 LAB — COMPREHENSIVE METABOLIC PANEL
ALT: 69 U/L — ABNORMAL HIGH (ref 0–53)
AST: 48 U/L — ABNORMAL HIGH (ref 0–37)
Albumin: 3.3 g/dL — ABNORMAL LOW (ref 3.5–5.2)
Alkaline Phosphatase: 88 U/L (ref 39–117)
BUN: 26 mg/dL — ABNORMAL HIGH (ref 6–23)
CO2: 24 mEq/L (ref 19–32)
Calcium: 8.5 mg/dL (ref 8.4–10.5)
Chloride: 104 mEq/L (ref 96–112)
Creatinine, Ser: 1.63 mg/dL — ABNORMAL HIGH (ref 0.50–1.35)
GFR calc Af Amer: 54 mL/min — ABNORMAL LOW (ref >90)
GFR calc non Af Amer: 47 mL/min — ABNORMAL LOW (ref >90)
Glucose, Bld: 177 mg/dL — ABNORMAL HIGH (ref 70–99)
Potassium: 4 mEq/L (ref 3.5–5.1)
Sodium: 139 mEq/L (ref 135–145)
Total Bilirubin: 0.3 mg/dL (ref 0.3–1.2)
Total Protein: 6.2 g/dL (ref 6.0–8.3)

## 2012-09-19 LAB — GLUCOSE, CAPILLARY: Glucose-Capillary: 100 mg/dL — ABNORMAL HIGH (ref 70–99)

## 2012-09-19 LAB — LIPASE, BLOOD: Lipase: 114 U/L — ABNORMAL HIGH (ref 11–59)

## 2012-09-19 LAB — TROPONIN I: Troponin I: <0.3 ng/mL (ref <0.30)

## 2012-09-19 LAB — MRSA PCR SCREENING: MRSA by PCR: NEGATIVE

## 2012-09-19 MED ORDER — LORATADINE 10 MG PO TABS
10.0000 mg | ORAL_TABLET | Freq: Every morning | ORAL | Status: DC
  Administered 2012-09-20: 10 mg via ORAL
  Filled 2012-09-19: qty 1

## 2012-09-19 MED ORDER — OXYCODONE-ACETAMINOPHEN 7.5-325 MG PO TABS: 1.0000 | ORAL_TABLET | Freq: Four times a day (QID) | ORAL | Status: DC | PRN

## 2012-09-19 MED ORDER — SODIUM CHLORIDE 0.9 % IV SOLN: INTRAVENOUS | Status: DC

## 2012-09-19 MED ORDER — ALLOPURINOL 300 MG PO TABS
300.0000 mg | ORAL_TABLET | Freq: Every day | ORAL | Status: DC
  Administered 2012-09-20: 300 mg via ORAL
  Filled 2012-09-19: qty 1

## 2012-09-19 MED ORDER — ATORVASTATIN CALCIUM 40 MG PO TABS: 80.0000 mg | ORAL_TABLET | Freq: Every day | ORAL | Status: DC

## 2012-09-19 MED ORDER — INSULIN ASPART 100 UNIT/ML ~~LOC~~ SOLN: 0.0000 [IU] | Freq: Every day | SUBCUTANEOUS | Status: DC

## 2012-09-19 MED ORDER — ASPIRIN EC 325 MG PO TBEC: 325.0000 mg | DELAYED_RELEASE_TABLET | Freq: Every day | ORAL | Status: DC

## 2012-09-19 MED ORDER — FAMOTIDINE IN NACL 20-0.9 MG/50ML-% IV SOLN
20.0000 mg | Freq: Once | INTRAVENOUS | Status: AC
  Administered 2012-09-19: 20 mg via INTRAVENOUS
  Filled 2012-09-19: qty 50

## 2012-09-19 MED ORDER — PANCRELIPASE (LIP-PROT-AMYL) 12000-38000 UNITS PO CPEP: 1.0000 | ORAL_CAPSULE | Freq: Three times a day (TID) | ORAL | Status: DC | PRN

## 2012-09-19 MED ORDER — INSULIN DETEMIR 100 UNIT/ML ~~LOC~~ SOLN
40.0000 [IU] | Freq: Every day | SUBCUTANEOUS | Status: DC
  Filled 2012-09-19: qty 0.4

## 2012-09-19 MED ORDER — OXYCODONE-ACETAMINOPHEN 5-325 MG PO TABS
1.0000 | ORAL_TABLET | Freq: Four times a day (QID) | ORAL | Status: DC | PRN
  Administered 2012-09-20: 1 via ORAL
  Filled 2012-09-19: qty 1

## 2012-09-19 MED ORDER — LENALIDOMIDE 5 MG PO CAPS: 5.0000 mg | ORAL_CAPSULE | Freq: Every day | ORAL | Status: DC

## 2012-09-19 MED ORDER — PANTOPRAZOLE SODIUM 40 MG IV SOLR: 40.0000 mg | INTRAVENOUS | Status: DC

## 2012-09-19 MED ORDER — SODIUM CHLORIDE 0.9 % IV SOLN
INTRAVENOUS | Status: DC
  Administered 2012-09-20: 03:00:00 via INTRAVENOUS

## 2012-09-19 MED ORDER — PANTOPRAZOLE SODIUM 40 MG PO TBEC
80.0000 mg | DELAYED_RELEASE_TABLET | Freq: Every day | ORAL | Status: DC
  Administered 2012-09-20: 80 mg via ORAL
  Filled 2012-09-19: qty 2

## 2012-09-19 MED ORDER — POTASSIUM CHLORIDE CRYS ER 20 MEQ PO TBCR: 40.0000 meq | EXTENDED_RELEASE_TABLET | Freq: Two times a day (BID) | ORAL | Status: DC

## 2012-09-19 MED ORDER — POTASSIUM CHLORIDE CRYS ER 20 MEQ PO TBCR
40.0000 meq | EXTENDED_RELEASE_TABLET | Freq: Two times a day (BID) | ORAL | Status: DC
  Administered 2012-09-19 – 2012-09-20 (×2): 40 meq via ORAL
  Filled 2012-09-19 (×2): qty 2

## 2012-09-19 MED ORDER — DEXAMETHASONE 4 MG PO TABS: 20.0000 mg | ORAL_TABLET | ORAL | Status: DC

## 2012-09-19 MED ORDER — PANTOPRAZOLE SODIUM 40 MG IV SOLR
40.0000 mg | Freq: Once | INTRAVENOUS | Status: AC
  Administered 2012-09-19: 40 mg via INTRAVENOUS
  Filled 2012-09-19: qty 40

## 2012-09-19 MED ORDER — RIVAROXABAN 10 MG PO TABS
20.0000 mg | ORAL_TABLET | Freq: Every day | ORAL | Status: DC
  Administered 2012-09-20: 20 mg via ORAL
  Filled 2012-09-19: qty 2
  Filled 2012-09-19: qty 1

## 2012-09-19 MED ORDER — INSULIN DETEMIR 100 UNIT/ML ~~LOC~~ SOLN: 40.0000 [IU] | Freq: Every day | SUBCUTANEOUS | Status: DC

## 2012-09-19 MED ORDER — PANCRELIPASE (LIP-PROT-AMYL) 12000-38000 UNITS PO CPEP
2.0000 | ORAL_CAPSULE | Freq: Three times a day (TID) | ORAL | Status: DC
  Administered 2012-09-19 – 2012-09-20 (×2): 2 via ORAL
  Filled 2012-09-19 (×2): qty 2

## 2012-09-19 MED ORDER — GI COCKTAIL ~~LOC~~
30.0000 mL | Freq: Once | ORAL | Status: AC
  Administered 2012-09-19: 30 mL via ORAL
  Filled 2012-09-19: qty 30

## 2012-09-19 MED ORDER — SULFAMETHOXAZOLE-TMP DS 800-160 MG PO TABS
1.0000 | ORAL_TABLET | ORAL | Status: DC
  Administered 2012-09-20: 1 via ORAL
  Filled 2012-09-19: qty 1

## 2012-09-19 MED ORDER — ONDANSETRON HCL 4 MG/2ML IJ SOLN
4.0000 mg | INTRAMUSCULAR | Status: DC | PRN
  Administered 2012-09-19: 4 mg via INTRAVENOUS
  Filled 2012-09-19: qty 2

## 2012-09-19 MED ORDER — VITAMIN D 1000 UNITS PO TABS
2000.0000 [IU] | ORAL_TABLET | Freq: Every day | ORAL | Status: DC
  Administered 2012-09-20: 2000 [IU] via ORAL
  Filled 2012-09-19: qty 2

## 2012-09-19 MED ORDER — LOPERAMIDE HCL 2 MG PO CAPS: 2.0000 mg | ORAL_CAPSULE | Freq: Four times a day (QID) | ORAL | Status: DC | PRN

## 2012-09-19 MED ORDER — MORPHINE SULFATE 4 MG/ML IJ SOLN
4.0000 mg | INTRAMUSCULAR | Status: AC | PRN
  Administered 2012-09-19 (×2): 4 mg via INTRAVENOUS
  Filled 2012-09-19 (×2): qty 1

## 2012-09-19 MED ORDER — OXYCODONE HCL 5 MG PO TABS
2.5000 mg | ORAL_TABLET | Freq: Four times a day (QID) | ORAL | Status: DC | PRN
  Administered 2012-09-20: 2.5 mg via ORAL
  Filled 2012-09-19: qty 1

## 2012-09-19 MED ORDER — TORSEMIDE 20 MG PO TABS
20.0000 mg | ORAL_TABLET | Freq: Every morning | ORAL | Status: DC
  Administered 2012-09-20: 20 mg via ORAL
  Filled 2012-09-19: qty 1

## 2012-09-19 MED ORDER — CARVEDILOL 3.125 MG PO TABS
3.1250 mg | ORAL_TABLET | Freq: Two times a day (BID) | ORAL | Status: DC
  Administered 2012-09-19: 3.125 mg via ORAL
  Filled 2012-09-19 (×2): qty 1

## 2012-09-19 MED ORDER — INSULIN ASPART 100 UNIT/ML ~~LOC~~ SOLN
0.0000 [IU] | Freq: Three times a day (TID) | SUBCUTANEOUS | Status: DC
  Administered 2012-09-19: 3 [IU] via SUBCUTANEOUS

## 2012-09-19 MED ORDER — DIPHENOXYLATE-ATROPINE 2.5-0.025 MG PO TABS
2.0000 | ORAL_TABLET | Freq: Every morning | ORAL | Status: DC
  Administered 2012-09-20: 2 via ORAL
  Filled 2012-09-19: qty 2

## 2012-09-19 MED ORDER — ACYCLOVIR 400 MG PO TABS
400.0000 mg | ORAL_TABLET | Freq: Every morning | ORAL | Status: DC
Start: 2012-09-20 — End: 2012-09-20
  Filled 2012-09-19: qty 1

## 2012-09-19 MED ORDER — PANTOPRAZOLE SODIUM 40 MG PO TBEC: 40.0000 mg | DELAYED_RELEASE_TABLET | Freq: Every day | ORAL | Status: DC

## 2012-09-19 MED ORDER — NITROGLYCERIN 0.4 MG SL SUBL: 0.4000 mg | SUBLINGUAL_TABLET | SUBLINGUAL | Status: DC | PRN

## 2012-09-19 MED ORDER — INSULIN ASPART 100 UNIT/ML ~~LOC~~ SOLN: 5.0000 [IU] | Freq: Three times a day (TID) | SUBCUTANEOUS | Status: DC

## 2012-09-19 NOTE — Progress Notes (Signed)
Pt. Admitted to unit 300 approx. 1600. Pt. Provided with drinks, oriented to room, initially assesed, along with IV placed in right [facility] space in ED. Pt. Was offered the choice of another IV due to [facility] placement. Pt expressed that he wanted an IV in the other arm or his port. Nurse and charge nurse both attempted to access port. Nurse and charge nurse present for each attempt First attempt failed. Following two attempts were correctly placed in power port, but failed to return blood or flush. ICU nurse called to assess port, and also agreed that the port was not functioning correctly. Pt. became frustrated and thus [facility] was called to resolve the situation. Pt. Made accusations toward staff that it was "not enough experience" or "something that you are doing" for the reason that the port is not working. Pt. Also made accusations that "not everything was sterile," however as witnessed by myself, the charge nurse, and the ICU nurse, everything used during the attempts were sterile, and no harm was done to the patient or port.

## 2012-09-19 NOTE — ED Notes (Addendum)
Pt presents to er with c/o epigastric pain that he has intermittently,  became worse last night and is made worse with eating and drinking. Admits to nausea. Pt states that he was seen by his cardiologist earlier in the week when the pain started and was told that the pain is not related to his heart.

## 2012-09-19 NOTE — ED Provider Notes (Signed)
History     CSN: [id number]  Arrival date & time 09/19/12  1103   First MD Initiated Contact with Patient 09/19/12 1141      Chief Complaint  Patient presents with  . Abdominal Pain    HPI Pt was seen at 1210.   Per pt, c/o gradual onset and persistence of constant mid-upper abd "pain" for the past 1 week.  Has been associated with nausea.  Describes the abd pain as "burning," "cramping" and "aching" with occasional radiation into his LUQ. States the pain worsens with eating and drinking. Pt was eval by is GI MD and Cards MD this week for same. States his Cards MD "told me it wasn't my heart."  Denies vomiting/diarrhea, no fevers, no back pain, no rash, no CP/SOB, no black or blood in stools or emesis.      GI: Dr. Laural Golden Cards: Dr. Lattie Haw Past Medical History  Diagnosis Date  . Arteriosclerotic cardiovascular disease (ASCVD)     MI-2000s; stent to the proximal LAD and diagonal in 2001; stress nuclear in 2008-impaired exercise capacity, left ventricular dilatation, moderately to severely depressed EF, apical, inferior and anteroseptal scar  . Sleep apnea   . Hypertension   . 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 Diabetes mellitus     Insulin  . GERD (gastroesophageal reflux disease)   . Pedal edema     Venous insufficiency  . Obesity   . Hyperlipidemia   . Injection site reaction   . Cellulitis of leg     both legs  . Multiple myeloma(203.0) 07/01/2011  . Chronic kidney disease, stage 3, mod decreased GFR     Creatinine of 1.84 in 06/2011 and 1.5 in 07/2011  . Chronic diarrhea   . Atrial flutter     Past Surgical History  Procedure Laterality Date  . Laparoscopic gastric banding  2006    has been removed  . Wrist surgery      Left; removal of bone fragment  . Incision and drainage abscess anal    . Abscess drainage      Scrotal  . Bone marrow biopsy  05/13/11  . Portacath placement  07/07/2011    Procedure: INSERTION PORT-A-CATH;  Surgeon: Scherry Ran, MD;   Location: [facility];  Service: General;  Laterality: N/A;  . Colonoscopy  11/28/2011    Procedure: COLONOSCOPY;  Surgeon: Rogene Houston, MD;  Location: [facility];  Service: Endoscopy;  Laterality: N/A;  930  . Esophagogastroduodenoscopy  01/02/2012    Procedure: ESOPHAGOGASTRODUODENOSCOPY (EGD);  Surgeon: Rogene Houston, MD;  Location: [facility];  Service: Endoscopy;  Laterality: N/A;  100  . Esophageal biopsy  01/02/2012    Procedure: BIOPSY;  Surgeon: Rogene Houston, MD;  Location: [facility];  Service: Endoscopy;  Laterality: N/A;    Family History  Problem Relation Age of Onset  . Heart disease Mother   . Cancer Mother   . Diabetes Father   . Anesthesia problems Neg Hx   . Hypotension Neg Hx   . Malignant hyperthermia Neg Hx   . Pseudochol deficiency Neg Hx   . Arthritis      History  Substance Use Topics  . Smoking status: Former Smoker    Types: Cigarettes, Cigars    Quit date: 05/17/2001  . Smokeless tobacco: Never Used  . Alcohol Use: No      Review of Systems ROS: Statement: All systems negative except as marked or noted in the HPI; Constitutional: Negative for  fever and chills. ; ; Eyes: Negative for eye pain, redness and discharge. ; ; ENMT: Negative for ear pain, hoarseness, nasal congestion, sinus pressure and sore throat. ; ; Cardiovascular: Negative for chest pain, palpitations, diaphoresis, dyspnea and peripheral edema. ; ; Respiratory: Negative for cough, wheezing and stridor. ; ; Gastrointestinal: +nausea, abd pain. Negative for vomiting, diarrhea, blood in stool, hematemesis, jaundice and rectal bleeding. . ; ; Genitourinary: Negative for dysuria, flank pain and hematuria. ; ; Musculoskeletal: Negative for back pain and neck pain. Negative for swelling and trauma.; ; Skin: Negative for pruritus, rash, abrasions, blisters, bruising and skin lesion.; ; Neuro: Negative for headache, lightheadedness and neck stiffness. Negative for weakness, altered level of  consciousness , altered mental status, extremity weakness, paresthesias, involuntary movement, seizure and syncope.       Allergies  Review of patient's allergies indicates no known allergies.  Home Medications   Current Outpatient Rx  Name  Route  Sig  Dispense  Refill  . acyclovir (ZOVIRAX) 400 MG tablet   Oral   Take 1 tablet (400 mg total) by mouth every morning.   30 tablet   3   . allopurinol (ZYLOPRIM) 300 MG tablet   Oral   Take 1 tablet (300 mg total) by mouth daily.   30 tablet   3   . aspirin EC 325 MG tablet   Oral   Take 325 mg by mouth daily.          Marland Kitchen atorvastatin (LIPITOR) 80 MG tablet   Oral   Take 1 tablet (80 mg total) by mouth daily.   90 tablet   3   . Calcium Carbonate-Vit D-Min (CALCIUM 1200 PO)   Oral   Take 1,200 mg by mouth daily.         . carvedilol (COREG) 3.125 MG tablet   Oral   Take 1 tablet (3.125 mg total) by mouth 2 (two) times daily with a meal.   60 tablet   6   . Cholecalciferol (VITAMIN D) 2000 UNITS CAPS   Oral   Take 1 capsule by mouth every morning.          Marland Kitchen dexamethasone (DECADRON) 4 MG tablet   Oral   Take 5 tablets (20 mg total) by mouth 2 (two) times daily. Every Friday   40 tablet   2   . dexlansoprazole (DEXILANT) 60 MG capsule   Oral   Take 60 mg by mouth every morning.          . diphenoxylate-atropine (LOMOTIL) 2.5-0.025 MG per tablet   Oral   Take 2 tablets by mouth every morning. Max 8 per day         . insulin aspart (NOVOLOG) 100 UNIT/ML injection   Subcutaneous   Inject 5-20 Units into the skin 3 (three) times daily before meals. Sliding scale         . insulin detemir (LEVEMIR) 100 UNIT/ML injection   Subcutaneous   Inject 40 Units into the skin daily.         Marland Kitchen lenalidomide ([name]) 5 MG capsule   Oral   Take 1 capsule (5 mg total) by mouth daily. Take 5 mg for 7 days on then off for 7 days and repeat   14 capsule   0   . loperamide (IMODIUM) 2 MG capsule   Oral    Take 1 capsule (2 mg total) by mouth 4 (four) times daily as needed for diarrhea or loose stools.  30 capsule   0   . loratadine (CLARITIN) 10 MG tablet   Oral   Take 10 mg by mouth every morning.          . Multiple Vitamins-Minerals (MULTIVITAMINS THER. W/MINERALS) TABS   Oral   Take 1 tablet by mouth every morning.          Marland Kitchen oxyCODONE-acetaminophen (PERCOCET) 7.5-325 MG per tablet   Oral   Take 1 tablet by mouth every 6 (six) hours as needed.   50 tablet   0   . Pancrelipase, Lip-Prot-Amyl, 36000 UNITS CPEP   Oral   Take 36,000-72,000 Units by mouth 3 (three) times daily. Take two with meals and one with snacks         . potassium chloride SA (K-DUR,KLOR-CON) 20 MEQ tablet   Oral   Take 40 mEq by mouth 2 (two) times daily.         . Rivaroxaban (XARELTO) 20 MG TABS   Oral   Take 1 tablet (20 mg total) by mouth daily.   30 tablet   6   . sulfamethoxazole-trimethoprim (BACTRIM DS) 800-160 MG per tablet   Oral   Take 1 tablet by mouth every Monday, Wednesday, and Friday.         . torsemide (DEMADEX) 20 MG tablet   Oral   Take 1 tablet (20 mg total) by mouth every morning.   30 tablet   5   . Zoledronic Acid (ZOMETA IV)   Intravenous   Inject 2 mg into the vein every 28 (twenty-eight) days. To start Jul 11, 2011         . lidocaine-prilocaine (EMLA) cream   Topical   Apply 1 application topically as needed. To port site. Apply 1 hour prior to treatment. Do not rub in. Cover with plastic.         . nitroGLYCERIN (NITROSTAT) 0.4 MG SL tablet   Sublingual   Place 1 tablet (0.4 mg total) under the tongue every 5 (five) minutes as needed for chest pain.   25 tablet   3     BP 124/64  Pulse 57  Temp(Src) 98.1 F (36.7 C) (Oral)  Resp 18  Ht 6\' 2"  (1.88 m)  Wt 332 lb (150.594 kg)  BMI 42.61 kg/m2  SpO2 100%  Physical Exam 1215: Physical examination:  Nursing notes reviewed; Vital signs and O2 SAT reviewed;  Constitutional: Well developed,  Well nourished, Well hydrated, In no acute distress; Head:  Normocephalic, atraumatic; Eyes: EOMI, PERRL, No scleral icterus; ENMT: Mouth and pharynx normal, Mucous membranes moist; Neck: Supple, Full range of motion, No lymphadenopathy; Cardiovascular: Regular rate and rhythm, No gallop; Respiratory: Breath sounds clear & equal bilaterally, No rales, rhonchi, wheezes.  Speaking full sentences with ease, Normal respiratory effort/excursion; Chest: Nontender, Movement normal; Abdomen: Soft, +mid-epigastric and LUQ tender to palp. No rebound or guarding. Nondistended, Normal bowel sounds; Genitourinary: No CVA tenderness; Extremities: Pulses normal, No tenderness, No edema, No calf edema or asymmetry.; Neuro: AA&Ox3, Major CN grossly intact.  Speech clear. No gross focal motor or sensory deficits in extremities.; Skin: Color normal, Warm, Dry.   ED Course  Procedures    MDM  MDM Reviewed: previous chart, nursing note and vitals Reviewed previous: labs and ECG Interpretation: labs, ECG, x-ray and CT scan      Date: 09/19/2012  Rate: 57  Rhythm: sinus bradycardia  QRS Axis: normal  Intervals: PR prolonged  ST/T Wave abnormalities: normal  Conduction Disutrbances:first-degree A-V  block   Narrative Interpretation:   Old EKG Reviewed: unchanged; no significant changes from previous EKG dated 09/23/2011.  Results for orders placed during the hospital encounter of 09/19/12  CBC WITH DIFFERENTIAL      Result Value Range   WBC 8.2  4.0 - 10.5 K/uL   RBC 4.27  4.22 - 5.81 MIL/uL   Hemoglobin 12.6 (*) 13.0 - 17.0 g/dL   HCT 37.5 (*) 39.0 - 52.0 %   MCV 87.8  78.0 - 100.0 fL   MCH 29.5  26.0 - 34.0 pg   MCHC 33.6  30.0 - 36.0 g/dL   RDW 15.4  11.5 - 15.5 %   Platelets 183  150 - 400 K/uL   Neutrophils Relative 73  43 - 77 %   Neutro Abs 6.0  1.7 - 7.7 K/uL   Lymphocytes Relative 15  12 - 46 %   Lymphs Abs 1.2  0.7 - 4.0 K/uL   Monocytes Relative 9  3 - 12 %   Monocytes Absolute 0.7  0.1 -  1.0 K/uL   Eosinophils Relative 3  0 - 5 %   Eosinophils Absolute 0.2  0.0 - 0.7 K/uL   Basophils Relative 0  0 - 1 %   Basophils Absolute 0.0  0.0 - 0.1 K/uL  COMPREHENSIVE METABOLIC PANEL      Result Value Range   Sodium 139  135 - 145 mEq/L   Potassium 4.0  3.5 - 5.1 mEq/L   Chloride 104  96 - 112 mEq/L   CO2 24  19 - 32 mEq/L   Glucose, Bld 177 (*) 70 - 99 mg/dL   BUN 26 (*) 6 - 23 mg/dL   Creatinine, Ser 1.63 (*) 0.50 - 1.35 mg/dL   Calcium 8.5  8.4 - 10.5 mg/dL   Total Protein 6.2  6.0 - 8.3 g/dL   Albumin 3.3 (*) 3.5 - 5.2 g/dL   AST 48 (*) 0 - 37 U/L   ALT 69 (*) 0 - 53 U/L   Alkaline Phosphatase 88  39 - 117 U/L   Total Bilirubin 0.3  0.3 - 1.2 mg/dL   GFR calc non Af Amer 47 (*) >90 mL/min   GFR calc Af Amer 54 (*) >90 mL/min  LIPASE, BLOOD      Result Value Range   Lipase 114 (*) 11 - 59 U/L  TROPONIN I      Result Value Range   Troponin I <0.30  <0.30 ng/mL   Ct Abdomen Pelvis Wo Contrast 09/19/2012  *RADIOLOGY REPORT*  Clinical Data: Epigastric pain.  CT ABDOMEN AND PELVIS WITHOUT CONTRAST  Technique:  Multidetector CT imaging of the abdomen and pelvis was performed following the standard protocol without intravenous contrast.  Comparison: 05/18/2011.  Findings: There is a hernia through the left lateral hemidiaphragm. Herniated fat and a splenic flexure of the colon.  No evidence of bowel obstruction.  Linear scarring in the right lung base.  No effusions.  Heart is borderline in size.  Liver, gallbladder, stomach, spleen, pancreas adrenals are unremarkable.  Mild bilateral perinephric stranding, stable since prior study.  No ureteral or renal stones.  No hydronephrosis.  Small bowel is decompressed.  Colon grossly unremarkable.  No free fluid, free air or adenopathy.  Aorta and iliac vessels are calcified, non-aneurysmal.  Urinary bladder and prostate grossly unremarkable.  Within the proximal stomach near the gastroesophageal junction, there is a small collection of air  is concerning for possible ulcer within the wall  on image 20.  Slight overlying stranding/inflammation.  Degenerative changes in the lumbar spine.  No acute bony abnormality.  IMPRESSION: Left lateral diaphragmatic hernia containing fat and the splenic flexure of the colon.  No evidence of bowel obstruction.  Findings suggestive of small ulcer with overlying inflammation within the proximal stomach near the GE junction (image 20).  Due to its location and size, this likely would not be detectable on an upper GI series.  Consider endoscopic correlation.   Original Report Authenticated By: Rolm Baptise, M.D.    Dg Chest 2 View 09/19/2012  *RADIOLOGY REPORT*  Clinical Data: Multiple myeloma.  Chest pain, shortness of breath.  CHEST - 2 VIEW  Comparison: 10/15/2011  Findings: Left Port-A-Cath remains in place, unchanged.  Heart is borderline in size.  Stable chronic density at the left base, which was shown on prior chest CT to represent a diaphragmatic hernia and herniated fat.  Adjacent lingular scarring.  Right lung is clear.  No effusions or acute bony abnormality.  IMPRESSION: No acute cardiopulmonary disease.   Original Report Authenticated By: Rolm Baptise, M.D.     Results for KHALEN, BETTON (MRN [id number]) as of 09/19/2012 15:08  Ref. Range 09/07/2012 13:32 09/08/2012 05:03 09/17/2012 16:13 09/19/2012 12:26  BUN Latest Range: 6-23 mg/dL 22 21 24  (H) 26 (H)  Creatinine Latest Range: 0.50-1.35 mg/dL 1.64 (H) 1.59 (H) 1.82 (H) 1.63 (H)    Results for THAO, DAFFERN (MRN [id number]) as of 09/19/2012 15:08  Ref. Range 05/18/2011 11:52 09/07/2012 13:32 09/15/2012 11:38 09/19/2012 12:26  Lipase Latest Range: 11-59 U/L 59 46 148 (H) 114 (H)    Results for FIDELIS, EVERETTE (MRN [id number]) as of 09/19/2012 15:08  Ref. Range 08/03/2012 15:20 08/31/2012 14:40 09/07/2012 13:32 09/15/2012 11:38 09/19/2012 12:26  AST Latest Range: 0-37 U/L 35 66 (H) 31 39 (H) 48 (H)  ALT Latest Range: 0-53 U/L 39 74 (H) 46 74 (H) 69 (H)      1520:  LFT's continue mildly elevated.  Lipase elevated, but trending down.  BUN/Cr elevated per baseline.  CT scan with probable gastric ulcer; most likely cause for pain.  T/C to GI Dr. Laural Golden, case discussed, including:  HPI, pertinent PM/SHx, VS/PE, dx testing, ED course and treatment:  Requests to admit to hospitalist service and he will perform EGD tomorrow; requests to dose IV protonix 40mg  q12h, diet clears today, NPO after midnight.  Dx and testing, as well as d/w GI Dr. Laural Golden, d/w pt and family.  Questions answered.  Verb understanding, agreeable to admit.  1530:  T/C to [facility] Dr. Anastasio Champion, case discussed, including:  HPI, pertinent PM/SHx, VS/PE, dx testing, ED course and treatment:  Agreeable to observation admit, requests he will come to ED for eval.      Alfonzo Feller, DO 09/21/12 2023

## 2012-09-19 NOTE — Progress Notes (Signed)
Called by charge nurse to take look at patients port-a-cath. Per report port was accessed the first time but unsuccessful. The second and third time port was accessed by RN and charge RN but failed to return blood and was sluggish. When I arrived in the room, port was still accessed from the third attempt. Attempted to pull back for blood return but unsuccessful. Tried to flush port but very sluggish and hard to flush. Pt reported no feelings of discomfort or pain. Advised RN to de-access port. At this point pt became upset and accused staff of "not having enough experience" and that we "were doing something wrong."   Tried to offer support and reassure patient that staff is competent to access his port, however I would have our [place] come and see pt.  Leanne Chang and Magda Paganini, ER nurse in to see pt.  ER nurse attempted to access port x2, unsuccessful. ER nurse started a new IV in pts left forearm.

## 2012-09-19 NOTE — H&P (Signed)
[facility] History and Physical  [name] [id number] DOB: 05-29-1961 DOA: 09/19/2012  Referring physician: Dr. Thurnell Garbe, ER physician. PCP: [name], MD  Specialists: Dr Laural Golden.  Chief Complaint: Epigastric pain.  HPI: [name] is a 52 y.o. male who describes epigastric pain for the last couple of days. He describes it as excruciating, does not seem to radiate anywhere. He says that even drinking water brings on the pain. There is no significant vomiting, he did describe a couple of episodes of nausea today which were short-lived. There is no fever. He has had chronic intermittent diarrhea for at least a year. He relates this to chemotherapy for his multiple myeloma. There is no hematemesis, rectal bleeding or melena. He was evaluated in the emergency room and the case was discussed by Dr. Thurnell Garbe with his gastroenterologist, Dr. Laural Golden. The decision has been made to perform upper GI endoscopy tomorrow morning. CT scan of the abdomen has been done in the emergency room which is abnormal, suggestive of small gastric ulcer.   Review of Systems:   Apart from history of present illness, other systems negative.  Past Medical History  Diagnosis Date  . Arteriosclerotic cardiovascular disease (ASCVD)     MI-2000s; stent to the proximal LAD and diagonal in 2001; stress nuclear in 2008-impaired exercise capacity, left ventricular dilatation, moderately to severely depressed EF, apical, inferior and anteroseptal scar  . Sleep apnea   . Hypertension   . 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 Diabetes mellitus     Insulin  . GERD (gastroesophageal reflux disease)   . Pedal edema     Venous insufficiency  . Obesity   . Hyperlipidemia   . Injection site reaction   . Cellulitis of leg     both legs  . Multiple myeloma(203.0) 07/01/2011  . Chronic kidney disease, stage 3, mod decreased GFR     Creatinine of 1.84 in 06/2011 and 1.5 in 07/2011  . Chronic diarrhea   .  Atrial flutter    Past Surgical History  Procedure Laterality Date  . Laparoscopic gastric banding  2006    has been removed  . Wrist surgery      Left; removal of bone fragment  . Incision and drainage abscess anal    . Abscess drainage      Scrotal  . Bone marrow biopsy  05/13/11  . Portacath placement  07/07/2011    Procedure: INSERTION PORT-A-CATH;  Surgeon: Scherry Ran, MD;  Location: [facility];  Service: General;  Laterality: N/A;  . Colonoscopy  11/28/2011    Procedure: COLONOSCOPY;  Surgeon: Rogene Houston, MD;  Location: [facility];  Service: Endoscopy;  Laterality: N/A;  930  . Esophagogastroduodenoscopy  01/02/2012    Procedure: ESOPHAGOGASTRODUODENOSCOPY (EGD);  Surgeon: Rogene Houston, MD;  Location: [facility];  Service: Endoscopy;  Laterality: N/A;  100  . Esophageal biopsy  01/02/2012    Procedure: BIOPSY;  Surgeon: Rogene Houston, MD;  Location: [facility];  Service: Endoscopy;  Laterality: N/A;   Social History:  reports that he quit smoking about 11 years ago. His smoking use included Cigarettes and Cigars. He smoked 0.00 packs per day. He has never used smokeless tobacco. He reports that he does not drink alcohol or use illicit drugs. Patient is married and lives with his wife. He is normally independent.   No Known Allergies  Family History  Problem Relation Age of Onset  . Heart disease Mother   . Cancer  Mother   . Diabetes Father   . Anesthesia problems Neg Hx   . Hypotension Neg Hx   . Malignant hyperthermia Neg Hx   . Pseudochol deficiency Neg Hx   . Arthritis        Prior to Admission medications   Medication Sig Start Date End Date Taking? Authorizing Provider  acyclovir (ZOVIRAX) 400 MG tablet Take 1 tablet (400 mg total) by mouth every morning. 08/13/12  Yes Baird Cancer, PA-C  allopurinol (ZYLOPRIM) 300 MG tablet Take 1 tablet (300 mg total) by mouth daily. 08/13/12  Yes Baird Cancer, PA-C  aspirin EC 325 MG tablet Take 325 mg by  mouth daily.    Yes Historical Provider, MD  atorvastatin (LIPITOR) 80 MG tablet Take 1 tablet (80 mg total) by mouth daily. 07/30/12  Yes Lendon Colonel, NP  Calcium Carbonate-Vit D-Min (CALCIUM 1200 PO) Take 1,200 mg by mouth daily.   Yes Historical Provider, MD  carvedilol (COREG) 3.125 MG tablet Take 1 tablet (3.125 mg total) by mouth 2 (two) times daily with a meal. 07/21/12  Yes Yehuda Savannah, MD  Cholecalciferol (VITAMIN D) 2000 UNITS CAPS Take 1 capsule by mouth every morning.    Yes Historical Provider, MD  dexamethasone (DECADRON) 4 MG tablet Take 5 tablets (20 mg total) by mouth 2 (two) times daily. Every Friday 07/12/12  Yes Manon Hilding Kefalas, PA-C  dexlansoprazole (DEXILANT) 60 MG capsule Take 60 mg by mouth every morning.    Yes Historical Provider, MD  diphenoxylate-atropine (LOMOTIL) 2.5-0.025 MG per tablet Take 2 tablets by mouth every morning. Max 8 per day 04/06/12  Yes Manon Hilding Kefalas, PA-C  insulin aspart (NOVOLOG) 100 UNIT/ML injection Inject 5-20 Units into the skin 3 (three) times daily before meals. Sliding scale   Yes Historical Provider, MD  insulin detemir (LEVEMIR) 100 UNIT/ML injection Inject 40 Units into the skin daily. 09/08/12  Yes Kathie Dike, MD  lenalidomide ([name]) 5 MG capsule Take 1 capsule (5 mg total) by mouth daily. Take 5 mg for 7 days on then off for 7 days and repeat 09/13/12  Yes Manon Hilding Kefalas, PA-C  loperamide (IMODIUM) 2 MG capsule Take 1 capsule (2 mg total) by mouth 4 (four) times daily as needed for diarrhea or loose stools. 09/08/12  Yes Kathie Dike, MD  loratadine (CLARITIN) 10 MG tablet Take 10 mg by mouth every morning.    Yes Historical Provider, MD  Multiple Vitamins-Minerals (MULTIVITAMINS THER. W/MINERALS) TABS Take 1 tablet by mouth every morning.    Yes Historical Provider, MD  oxyCODONE-acetaminophen (PERCOCET) 7.5-325 MG per tablet Take 1 tablet by mouth every 6 (six) hours as needed. 06/16/12  Yes Pieter Partridge, MD   Pancrelipase, Lip-Prot-Amyl, 36000 UNITS CPEP Take [id number] Units by mouth 3 (three) times daily. Take two with meals and one with snacks 03/22/12  Yes Rogene Houston, MD  potassium chloride SA (K-DUR,KLOR-CON) 20 MEQ tablet Take 40 mEq by mouth 2 (two) times daily. 09/17/12  Yes Lendon Colonel, NP  Rivaroxaban (XARELTO) 20 MG TABS Take 1 tablet (20 mg total) by mouth daily. 09/17/12  Yes Lendon Colonel, NP  sulfamethoxazole-trimethoprim (BACTRIM DS) 800-160 MG per tablet Take 1 tablet by mouth every Monday, Wednesday, and Friday.   Yes Historical Provider, MD  torsemide (DEMADEX) 20 MG tablet Take 1 tablet (20 mg total) by mouth every morning. 05/31/12  Yes Rogene Houston, MD  Zoledronic Acid (ZOMETA IV) Inject 2 mg into the  vein every 28 (twenty-eight) days. To start Jul 11, 2011   Yes Historical Provider, MD  lidocaine-prilocaine (EMLA) cream Apply 1 application topically as needed. To port site. Apply 1 hour prior to treatment. Do not rub in. Cover with plastic.    Historical Provider, MD  nitroGLYCERIN (NITROSTAT) 0.4 MG SL tablet Place 1 tablet (0.4 mg total) under the tongue every 5 (five) minutes as needed for chest pain. 09/17/12   Lendon Colonel, NP   Physical Exam: Filed Vitals:   09/19/12 1107 09/19/12 1336 09/19/12 1548  BP: 129/87 124/64 120/62  Pulse: 63 57 60  Temp: 98.1 F (36.7 C)  98.3 F (36.8 C)  TempSrc: Oral  Oral  Resp: 20 18 18   Height: 6\' 2"  (1.88 m)    Weight: 150.594 kg (332 lb)    SpO2: 100% 100% 100%     General:  He looks systemically well. He is obese. He does not appear to be in any acute pain at the present time.  Eyes: No jaundice. No pallor.  ENT: No abnormalities.  Neck: No lymphadenopathy.  Cardiovascular: Heart sounds are present and in normal sinus rhythm. There is no evidence clinically of heart failure the present time.  Respiratory: Lung fields are clear.  Abdomen: Soft, mildly tender in the epigastric area. Bowel sounds  are heard.  Skin: No rash.  Musculoskeletal: No acute joint abnormalities.  Psychiatric: Appropriate affect.  Neurologic: Alert and orientated. No focal neurological signs  Labs on Admission:  Basic Metabolic Panel:  Recent Labs Lab 09/17/12 1613 09/19/12 1226  NA 140 139  K 3.9 4.0  CL 108 104  CO2 24 24  GLUCOSE 209* 177*  BUN 24* 26*  CREATININE 1.82* 1.63*  CALCIUM 8.6 8.5   Liver Function Tests:  Recent Labs Lab 09/15/12 1138 09/19/12 1226  AST 39* 48*  ALT 74* 69*  ALKPHOS 90 88  BILITOT 0.4 0.3  PROT 5.8* 6.2  ALBUMIN 3.7 3.3*    Recent Labs Lab 09/15/12 1138 09/19/12 1226  LIPASE 148* 114*  AMYLASE 195*  --     CBC:  Recent Labs Lab 09/19/12 1226  WBC 8.2  NEUTROABS 6.0  HGB 12.6*  HCT 37.5*  MCV 87.8  PLT 183   Cardiac Enzymes:  Recent Labs Lab 09/19/12 1226  TROPONINI <0.30      Radiological Exams on Admission: Ct Abdomen Pelvis Wo Contrast  09/19/2012  *RADIOLOGY REPORT*  Clinical Data: Epigastric pain.  CT ABDOMEN AND PELVIS WITHOUT CONTRAST  Technique:  Multidetector CT imaging of the abdomen and pelvis was performed following the standard protocol without intravenous contrast.  Comparison: 05/18/2011.  Findings: There is a hernia through the left lateral hemidiaphragm. Herniated fat and a splenic flexure of the colon.  No evidence of bowel obstruction.  Linear scarring in the right lung base.  No effusions.  Heart is borderline in size.  Liver, gallbladder, stomach, spleen, pancreas adrenals are unremarkable.  Mild bilateral perinephric stranding, stable since prior study.  No ureteral or renal stones.  No hydronephrosis.  Small bowel is decompressed.  Colon grossly unremarkable.  No free fluid, free air or adenopathy.  Aorta and iliac vessels are calcified, non-aneurysmal.  Urinary bladder and prostate grossly unremarkable.  Within the proximal stomach near the gastroesophageal junction, there is a small collection of air is  concerning for possible ulcer within the wall on image 20.  Slight overlying stranding/inflammation.  Degenerative changes in the lumbar spine.  No acute bony abnormality.  IMPRESSION:  Left lateral diaphragmatic hernia containing fat and the splenic flexure of the colon.  No evidence of bowel obstruction.  Findings suggestive of small ulcer with overlying inflammation within the proximal stomach near the GE junction (image 20).  Due to its location and size, this likely would not be detectable on an upper GI series.  Consider endoscopic correlation.   Original Report Authenticated By: Rolm Baptise, M.D.    Dg Chest 2 View  09/19/2012  *RADIOLOGY REPORT*  Clinical Data: Multiple myeloma.  Chest pain, shortness of breath.  CHEST - 2 VIEW  Comparison: 10/15/2011  Findings: Left Port-A-Cath remains in place, unchanged.  Heart is borderline in size.  Stable chronic density at the left base, which was shown on prior chest CT to represent a diaphragmatic hernia and herniated fat.  Adjacent lingular scarring.  Right lung is clear.  No effusions or acute bony abnormality.  IMPRESSION: No acute cardiopulmonary disease.   Original Report Authenticated By: Rolm Baptise, M.D.       Assessment/Plan   1. Epigastric pain with abnormal CT scan of the abdomen. 2. Morbid obesity. 3. Type 2 diabetes mellitus. 4. Multiple myeloma, on treatment, including steroids. 5. Nonischemic cardiomyopathy with ejection fraction 30-35%, currently compensated. 6. Hypertension. 7. Acute on chronic renal failure.  Plan: 1. Admit to medical floor. 2. Intravenous PPI. 3. N.p.o. after midnight. 4. Gastroenterology consultation with a view to the EGD tomorrow morning by Dr. Laural Golden.  Code Status: Full code.  Family Communication: Discussed the plan with patient at the bedside.   Disposition Plan: Home when medically stable.  Time spent: 45 minutes.  Doree Albee [facility] Pager [phone].  If 7PM-7AM, please  contact night-coverage [email or web address] Password [facility] 09/19/2012, 3:52 PM

## 2012-09-19 NOTE — Progress Notes (Signed)
Called by RN to assist with accessing patient's port-a-cath.  First attempt by patient's RN was unsuccessful.  Second attempt placed successfully but failed to give blood return.  Deaccessed port.  Third attempt by charge RN was successful but failed to give blood return and sluggish to flush.  Patient denied pain at the insertion site and no swelling present.  Called ICU nurse to assist with port-a-cath access.  Left patient's nurse and ICU nurse present at bedside to assist with accessing port-a-cath.  Schonewitz, Eulis Canner 09/19/2012

## 2012-09-19 NOTE — ED Notes (Signed)
Pt identifies pain as being upper epigastric, worse after eating/drinking, states pain is intermittent, described as burning

## 2012-09-20 ENCOUNTER — Encounter (HOSPITAL_COMMUNITY): Payer: Self-pay | Admitting: *Deleted

## 2012-09-20 ENCOUNTER — Encounter (HOSPITAL_COMMUNITY)
Admission: EM | Disposition: A | Discharge status: Home / Self Care | Payer: Self-pay | Source: Home / Self Care | Attending: Internal Medicine

## 2012-09-20 DIAGNOSIS — K449 Diaphragmatic hernia without obstruction or gangrene: Secondary | ICD-10-CM

## 2012-09-20 DIAGNOSIS — E119 Type 2 diabetes mellitus without complications: Secondary | ICD-10-CM

## 2012-09-20 DIAGNOSIS — I4892 Unspecified atrial flutter: Secondary | ICD-10-CM

## 2012-09-20 DIAGNOSIS — R110 Nausea: Secondary | ICD-10-CM

## 2012-09-20 DIAGNOSIS — R1013 Epigastric pain: Secondary | ICD-10-CM

## 2012-09-20 DIAGNOSIS — K208 Other esophagitis without bleeding: Secondary | ICD-10-CM

## 2012-09-20 DIAGNOSIS — R933 Abnormal findings on diagnostic imaging of other parts of digestive tract: Secondary | ICD-10-CM

## 2012-09-20 HISTORY — PX: ESOPHAGOGASTRODUODENOSCOPY: SHX5428

## 2012-09-20 LAB — CBC
HCT: 34.2 % — ABNORMAL LOW (ref 39.0–52.0)
Hemoglobin: 11.5 g/dL — ABNORMAL LOW (ref 13.0–17.0)
MCH: 29.7 pg (ref 26.0–34.0)
MCHC: 33.6 g/dL (ref 30.0–36.0)
MCV: 88.4 fL (ref 78.0–100.0)
Platelets: 159 10*3/uL (ref 150–400)
RBC: 3.87 MIL/uL — ABNORMAL LOW (ref 4.22–5.81)
RDW: 15.5 % (ref 11.5–15.5)
WBC: 8.1 10*3/uL (ref 4.0–10.5)

## 2012-09-20 LAB — GLUCOSE, CAPILLARY
Glucose-Capillary: 142 mg/dL — ABNORMAL HIGH (ref 70–99)
Glucose-Capillary: 138 mg/dL — ABNORMAL HIGH (ref 70–99)
Glucose-Capillary: 54 mg/dL — ABNORMAL LOW (ref 70–99)
Glucose-Capillary: 83 mg/dL (ref 70–99)
Glucose-Capillary: 95 mg/dL (ref 70–99)
Glucose-Capillary: 135 mg/dL — ABNORMAL HIGH (ref 70–99)

## 2012-09-20 LAB — COMPREHENSIVE METABOLIC PANEL
ALT: 50 U/L (ref 0–53)
AST: 26 U/L (ref 0–37)
Albumin: 2.8 g/dL — ABNORMAL LOW (ref 3.5–5.2)
Alkaline Phosphatase: 71 U/L (ref 39–117)
BUN: 18 mg/dL (ref 6–23)
CO2: 24 mEq/L (ref 19–32)
Calcium: 7.7 mg/dL — ABNORMAL LOW (ref 8.4–10.5)
Chloride: 110 mEq/L (ref 96–112)
Creatinine, Ser: 1.47 mg/dL — ABNORMAL HIGH (ref 0.50–1.35)
GFR calc Af Amer: 61 mL/min — ABNORMAL LOW (ref >90)
GFR calc non Af Amer: 53 mL/min — ABNORMAL LOW (ref >90)
Glucose, Bld: 59 mg/dL — ABNORMAL LOW (ref 70–99)
Potassium: 3.7 mEq/L (ref 3.5–5.1)
Sodium: 141 mEq/L (ref 135–145)
Total Bilirubin: 0.3 mg/dL (ref 0.3–1.2)
Total Protein: 5.3 g/dL — ABNORMAL LOW (ref 6.0–8.3)

## 2012-09-20 SURGERY — EGD (ESOPHAGOGASTRODUODENOSCOPY)
Anesthesia: Moderate Sedation

## 2012-09-20 MED ORDER — MIDAZOLAM HCL 5 MG/5ML IJ SOLN
INTRAMUSCULAR | Status: DC | PRN
  Administered 2012-09-20 (×2): 3 mg via INTRAVENOUS

## 2012-09-20 MED ORDER — OXYCODONE HCL 5 MG PO TABS: 5.0000 mg | ORAL_TABLET | ORAL | Status: DC | PRN

## 2012-09-20 MED ORDER — OXYCODONE HCL 5 MG PO TABS: ORAL_TABLET | ORAL | Status: DC

## 2012-09-20 MED ORDER — SIMETHICONE 40 MG/0.6ML PO SUSP
ORAL | Status: DC | PRN
  Administered 2012-09-20: 10:00:00

## 2012-09-20 MED ORDER — SODIUM CHLORIDE 0.9 % IV SOLN: INTRAVENOUS | Status: DC

## 2012-09-20 MED ORDER — MEPERIDINE HCL 25 MG/ML IJ SOLN
INTRAMUSCULAR | Status: DC | PRN
  Administered 2012-09-20 (×2): 25 mg via INTRAVENOUS

## 2012-09-20 MED ORDER — MEPERIDINE HCL 50 MG/ML IJ SOLN
INTRAMUSCULAR | Status: AC
  Filled 2012-09-20: qty 1

## 2012-09-20 MED ORDER — ACYCLOVIR 200 MG PO CAPS
400.0000 mg | ORAL_CAPSULE | Freq: Every day | ORAL | Status: DC
  Administered 2012-09-20: 400 mg via ORAL
  Filled 2012-09-20: qty 2

## 2012-09-20 MED ORDER — DEXTROSE-NACL 5-0.9 % IV SOLN
INTRAVENOUS | Status: DC
  Administered 2012-09-20: 10:00:00 via INTRAVENOUS

## 2012-09-20 MED ORDER — MIDAZOLAM HCL 5 MG/5ML IJ SOLN
INTRAMUSCULAR | Status: AC
  Filled 2012-09-20: qty 10

## 2012-09-20 MED ORDER — INSULIN DETEMIR 100 UNIT/ML ~~LOC~~ SOLN
35.0000 [IU] | Freq: Every day | SUBCUTANEOUS | Status: DC
  Administered 2012-09-20: 35 [IU] via SUBCUTANEOUS
  Filled 2012-09-20 (×2): qty 0.35

## 2012-09-20 MED ORDER — DEXTROSE 50 % IV SOLN
INTRAVENOUS | Status: AC
  Administered 2012-09-20: 50 mL
  Filled 2012-09-20: qty 50

## 2012-09-20 MED ORDER — LENALIDOMIDE 5 MG PO CAPS: 5.0000 mg | ORAL_CAPSULE | Freq: Every day | ORAL | Status: DC

## 2012-09-20 NOTE — Discharge Summary (Signed)
Physician Discharge Summary  [name] [id number] DOB: 11-10-1960 DOA: 09/19/2012  PCP: Franklin Font, MD  Admit date: 09/19/2012 Discharge date: 09/20/2012  Time spent: 40 minutes  Recommendations for Outpatient Follow-up:  1. Follow up with PCP 1 week for symptom evaluation. Recommend BMET for monitoring renal function 2. [name] office will arrange for outpatient endoscopic ultra sound of pancreas.   Discharge Diagnoses:  Principal Problem:   Epigastric pain Active Problems:   DIABETES MELLITUS, TYPE II, ON INSULIN   Morbid obesity   Multiple myeloma   Hypertension   Diarrhea   Systolic dysfunction   Discharge Condition: stable and ready for discharge  Diet recommendation: carb modified  Filed Weights   09/19/12 1107 09/19/12 1829 09/20/12 0928  Weight: 150.594 kg (332 lb) 150.594 kg (332 lb) 150.594 kg (332 lb)    History of present illness:  [name] is a 52 y.o. male who presented to Ed on 09/19/12 describing epigastric pain for  Previous couple of days. He described it as excruciating, and did  not seem to radiate anywhere. He said that even drinking water brings on the pain. There was no significant vomiting, he did describe a couple of episodes of nausea  which were short-lived. There was no fever. He  had chronic intermittent diarrhea for at least a year. He related this to chemotherapy for his multiple myeloma. There was no hematemesis, rectal bleeding or melena. He was evaluated in the emergency room and the case was discussed by [name] with his gastroenterologist, [name]. The decision was made to perform upper GI endoscopy 09/20/12. CT scan of the abdomen had been done in the emergency room which is abnormal, suggestive of small gastric ulcer.      Hospital Course:  Epigastric pain: CT concerning for gastric ulcer. EGD this am yields small hiatal hernia with changes of reflux epophagitis limited to GE junction along with erosion. No  evidence of gastric ulcer. GI recommending continuing double dose PPI and will arrange for OP endoscopic [place] of pancreas. Pain  Improved somewhat at discharge. Tolerating diet at discharge. At discharge VSS. Non-toxic appearing.   Active Problems:  DIABETES MELLITUS, TYPE II, ON INSULIN: hypoglycemic this am. Likely due to NPO status. At discharge patient tolerating diet with manageable pain. Will resume home regimen of levimir.    Hypertension: remained fairly well  controlled during hospitalization. Resume home meds. Consider low dose    Systolic dysfunction/non-ischemic cardiomyopathy with EF 30-35%. Seen 4/18 by cards and repeat echo pending. Wt 150.9 from 151.5 on admission. At discharge no s/sx overload.  Atrial flutter: seen by cards 4/18. Xareltol started. To follow up with cards in 2 weeks. Continue at discharge  Acute on chronic renal failure (III). Creatinine improving with gently hydration. At discharge close to baseline.  Recommend BMET in 1 week.  Morbid obesity: nutritional consult  Multiple myeloma: stable. On steroids  Diarrhea : resolved    Procedures: EGD 09/20/12 Consultations:  GI [name]  Discharge Exam: Filed Vitals:   09/20/12 1020 09/20/12 1025 09/20/12 1128 09/20/12 1400  BP: 125/111 124/80 119/79 121/74  Pulse: 66 71 53 76  Temp:   97.4 F (36.3 C) 97.1 F (36.2 C)  TempSrc:   Oral Oral  Resp: 22 18 18 18   Height:      Weight:      SpO2: 99% 99% 98% 97%    General: obese sitting in chair NAD Cardiovascular: RRR No MGR trace LE edema Respiratory: normal  effort BS clear bilaterally. No wheeze or rhonchi Abdomen: obese, soft mild tenderness epigastric area. +BS   Discharge Instructions      Discharge Orders   Future Appointments Provider Department Dept Phone   09/21/2012 1:00 PM [facility] [place] [phone]   09/28/2012 2:15 PM [place] [phone]   10/05/2012 1:00 PM  Franklin Savannah, MD [facility]   10/06/2012 3:30 PM [name] [place] [phone]   12/20/2012 3:45 PM Franklin Houston, MD [facility] [phone]   Future Orders Complete By Expires     Diet - low sodium heart healthy  As directed     Increase activity slowly  As directed         Medication List    STOP taking these medications       aspirin EC 325 MG tablet      TAKE these medications       acyclovir 400 MG tablet  Commonly known as:  ZOVIRAX  Take 1 tablet (400 mg total) by mouth every morning.     allopurinol 300 MG tablet  Commonly known as:  ZYLOPRIM  Take 1 tablet (300 mg total) by mouth daily.     atorvastatin 80 MG tablet  Commonly known as:  LIPITOR  Take 1 tablet (80 mg total) by mouth daily.     CALCIUM 1200 PO  Take 1,200 mg by mouth daily.     carvedilol 3.125 MG tablet  Commonly known as:  COREG  Take 1 tablet (3.125 mg total) by mouth 2 (two) times daily with a meal.     dexamethasone 4 MG tablet  Commonly known as:  DECADRON  Take 5 tablets (20 mg total) by mouth 2 (two) times daily. Every Friday     DEXILANT 60 MG capsule  Generic drug:  dexlansoprazole  Take 60 mg by mouth every morning.     diphenoxylate-atropine 2.5-0.025 MG per tablet  Commonly known as:  LOMOTIL  Take 2 tablets by mouth every morning. Max 8 per day     insulin aspart 100 UNIT/ML injection  Commonly known as:  novoLOG  Inject 5-20 Units into the skin 3 (three) times daily before meals. Sliding scale     insulin detemir 100 UNIT/ML injection  Commonly known as:  LEVEMIR  Inject 40 Units into the skin daily.     lenalidomide 5 MG capsule  Commonly known as:  [name]  Take 1 capsule (5 mg total) by mouth daily.  Start taking on:  09/26/2012     lidocaine-prilocaine cream  Commonly known as:  EMLA  Apply 1 application topically as needed. To port site. Apply 1 hour prior to  treatment. Do not rub in. Cover with plastic.     loperamide 2 MG capsule  Commonly known as:  IMODIUM  Take 1 capsule (2 mg total) by mouth 4 (four) times daily as needed for diarrhea or loose stools.     loratadine 10 MG tablet  Commonly known as:  CLARITIN  Take 10 mg by mouth every morning.     multivitamins ther. w/minerals Tabs  Take 1 tablet by mouth every morning.     nitroGLYCERIN 0.4 MG SL tablet  Commonly known as:  NITROSTAT  Place 1 tablet (0.4 mg total) under the tongue every 5 (five) minutes as needed for chest pain.     oxyCODONE 5 MG immediate  release tablet  Commonly known as:  Oxy IR/ROXICODONE  1-2 tabs every 4 hours as needed for pain     oxyCODONE-acetaminophen 7.5-325 MG per tablet  Commonly known as:  PERCOCET  Take 1 tablet by mouth every 6 (six) hours as needed.     Pancrelipase (Lip-Prot-Amyl) 36000 UNITS Cpep  Take [id number] Units by mouth 3 (three) times daily. Take two with meals and one with snacks     potassium chloride SA 20 MEQ tablet  Commonly known as:  K-DUR,KLOR-CON  Take 40 mEq by mouth 2 (two) times daily.     Rivaroxaban 20 MG Tabs  Commonly known as:  XARELTO  Take 1 tablet (20 mg total) by mouth daily.     sulfamethoxazole-trimethoprim 800-160 MG per tablet  Commonly known as:  BACTRIM DS  Take 1 tablet by mouth every Monday, Wednesday, and Friday.     torsemide 20 MG tablet  Commonly known as:  DEMADEX  Take 1 tablet (20 mg total) by mouth every morning.     Vitamin D 2000 UNITS Caps  Take 1 capsule by mouth every morning.     ZOMETA IV  Inject 2 mg into the vein every 28 (twenty-eight) days. To start Jul 11, 2011       Follow-up Information   Follow up with [facility], MD. Schedule an appointment as soon as possible for a visit in 1 week.   Contact information:   [address] [place] [address] [phone]       Please follow up. ([name] office will arrange outpatient endoscopic ultra  sound  of pancreas)        The results of significant diagnostics from this hospitalization (including imaging, microbiology, ancillary and laboratory) are listed below for reference.    Significant Diagnostic Studies: Ct Abdomen Pelvis Wo Contrast  09/19/2012  *RADIOLOGY REPORT*  Clinical Data: Epigastric pain.  CT ABDOMEN AND PELVIS WITHOUT CONTRAST  Technique:  Multidetector CT imaging of the abdomen and pelvis was performed following the standard protocol without intravenous contrast.  Comparison: 05/18/2011.  Findings: There is a hernia through the left lateral hemidiaphragm. Herniated fat and a splenic flexure of the colon.  No evidence of bowel obstruction.  Linear scarring in the right lung base.  No effusions.  Heart is borderline in size.  Liver, gallbladder, stomach, spleen, pancreas adrenals are unremarkable.  Mild bilateral perinephric stranding, stable since prior study.  No ureteral or renal stones.  No hydronephrosis.  Small bowel is decompressed.  Colon grossly unremarkable.  No free fluid, free air or adenopathy.  Aorta and iliac vessels are calcified, non-aneurysmal.  Urinary bladder and prostate grossly unremarkable.  Within the proximal stomach near the gastroesophageal junction, there is a small collection of air is concerning for possible ulcer within the wall on image 20.  Slight overlying stranding/inflammation.  Degenerative changes in the lumbar spine.  No acute bony abnormality.  IMPRESSION: Left lateral diaphragmatic hernia containing fat and the splenic flexure of the colon.  No evidence of bowel obstruction.  Findings suggestive of small ulcer with overlying inflammation within the proximal stomach near the GE junction (image 20).  Due to its location and size, this likely would not be detectable on an upper GI series.  Consider endoscopic correlation.   Original Report Authenticated By: Rolm Baptise, M.D.    Dg Chest 2 View  09/19/2012  *RADIOLOGY REPORT*  Clinical Data:  Multiple myeloma.  Chest pain, shortness of breath.  CHEST -  2 VIEW  Comparison: 10/15/2011  Findings: Left Port-A-Cath remains in place, unchanged.  Heart is borderline in size.  Stable chronic density at the left base, which was shown on prior chest CT to represent a diaphragmatic hernia and herniated fat.  Adjacent lingular scarring.  Right lung is clear.  No effusions or acute bony abnormality.  IMPRESSION: No acute cardiopulmonary disease.   Original Report Authenticated By: Rolm Baptise, M.D.     Microbiology: Recent Results (from the past 240 hour(s))  MRSA PCR SCREENING     Status: None   Collection Time    09/19/12 10:15 PM      Result Value Range Status   MRSA by PCR NEGATIVE  NEGATIVE Final   Comment:            The GeneXpert MRSA Assay (FDA     approved for NASAL specimens     only), is one component of a     comprehensive MRSA colonization     surveillance program. It is not     intended to diagnose MRSA     infection nor to guide or     monitor treatment for     MRSA infections.     Labs: Basic Metabolic Panel:  Recent Labs Lab 09/17/12 1613 09/19/12 1226 09/20/12 0518  NA 140 139 141  K 3.9 4.0 3.7  CL 108 104 110  CO2 24 24 24   GLUCOSE 209* 177* 59*  BUN 24* 26* 18  CREATININE 1.82* 1.63* 1.47*  CALCIUM 8.6 8.5 7.7*   Liver Function Tests:  Recent Labs Lab 09/15/12 1138 09/19/12 1226 09/20/12 0518  AST 39* 48* 26  ALT 74* 69* 50  ALKPHOS 90 88 71  BILITOT 0.4 0.3 0.3  PROT 5.8* 6.2 5.3*  ALBUMIN 3.7 3.3* 2.8*    Recent Labs Lab 09/15/12 1138 09/19/12 1226  LIPASE 148* 114*  AMYLASE 195*  --    No results found for this basename: AMMONIA,  in the last 168 hours CBC:  Recent Labs Lab 09/19/12 1226 09/20/12 0518  WBC 8.2 8.1  NEUTROABS 6.0  --   HGB 12.6* 11.5*  HCT 37.5* 34.2*  MCV 87.8 88.4  PLT 183 159   Cardiac Enzymes:  Recent Labs Lab 09/19/12 1226  TROPONINI <0.30   BNP: BNP (last 3 results) No results found for  this basename: PROBNP,  in the last 8760 hours CBG:  Recent Labs Lab 09/19/12 2053 09/20/12 0650 09/20/12 0719 09/20/12 0935 09/20/12 1109  GLUCAP 100* 54* 138* 83 95       Signed:  [facility] 09/20/2012, 4:04 PM  Seen and agree.  Doree Barthel, M.D.

## 2012-09-20 NOTE — Progress Notes (Signed)
CPAP set at 13 , pt still not wearing watching ball game.

## 2012-09-20 NOTE — Progress Notes (Signed)
Inpatient Diabetes Program Recommendations  AACE/ADA: New Consensus Statement on Inpatient Glycemic Control (2013)  Target Ranges:  Prepandial:   less than 140 mg/dL      Peak postprandial:   less than 180 mg/dL (1-2 hours)      Critically ill patients:  140 - 180 mg/dL  Results for [name], [name] (MRN [id number]) as of 09/20/2012 07:36  Ref. Range 09/20/2012 05:18  Glucose Latest Range: 70-99 mg/dL 59 (L)   Results for [name], [name] (MRN [id number]) as of 09/20/2012 07:36  Ref. Range 09/20/2012 06:50 09/20/2012 07:19  Glucose-Capillary Latest Range: 70-99 mg/dL 54 (L) 138 (H)    Reason for Visit: Hypoglycemia   Note: Noted that patient's blood glucose on labs this morning was 59 mg/dl.  Called unit and spoke with L. Dahlia Client, NT and asked that patient's blood glucose be check due to lab results.  Patient is currently NPO for possible EGD this morning.  Finger stick noted to be 54 mg/dl at 6:50 and patient was treated for hypoglycemia.  Patient takes Levemir 40 units QHS at home for diabetes management.  Currently, patient is ordered to receive Levemir 40 units daily, Novolog 0-20 AC, and Novolog 0-5 HS for inpatient glycemic control.  Patient was not given any Lantus since admission but according to the home medication list in the computer, patient's last dose of Lantus was on 09/19/12.  May need to slightly decrease dose of Levemir as inpatient to prevent further episodes of hypoglycemia, especially while patient is NPO.  Will continue to follow.  Thanks, Barnie Alderman, RN, BSN, [facility] Coordinator Inpatient Diabetes Program [phone]

## 2012-09-20 NOTE — Op Note (Signed)
EGD PROCEDURE REPORT  PATIENT:  [name]  MR#:  [id number] Birthdate:  [birth year], 52 y.o., male Endoscopist:  Dr. Rogene Houston, MD Procedure Date: 09/20/2012  Procedure:   EGD  Indications:  Patient is-year-old African male with complicated medical history who is admitted to this facility yesterday afternoon with acute on chronic epigastric pain and nausea. Abdominopelvic CT suggested gastric ulcer. He is undergoing diagnostic EGD.            Informed Consent:  The risks, benefits, alternatives & imponderables which include, but are not limited to, bleeding, infection, perforation, drug reaction and potential missed lesion have been reviewed.  The potential for biopsy, lesion removal, esophageal dilation, etc. have also been discussed.  Questions have been answered.  All parties agreeable.  Please see history & physical in medical record for more information.  Medications:  Demerol 50 mg IV Versed 6 mg IV Cetacaine spray topically for oropharyngeal anesthesia  Description of procedure:  The endoscope was introduced through the mouth and advanced to the second portion of the duodenum without difficulty or limitations. The mucosal surfaces were surveyed very carefully during advancement of the scope and upon withdrawal.  Findings:  Esophagus:  Mucosa of the esophagus was normal. 2 tiny islands of gastric type mucosa noted proximal to GE junction. Focal erythema and edema as well as single erosion noted at GE junction. GEJ:  44 cm Hiatus:  40 cm Stomach:  Band  noted in the proximal stomach and scope passed on either side of this and into gastric body and antrum. Scant amount of food debris noted along with silk suture. Stomach distended very well with insufflation. Focal erythema and edema noted at gastric body but no ulcer identified. Angularis fundus and cardia were examined by retroflexing the scope and were normal. There was scar in the prepyloric region assistant with healed  ulcer. Pyloric channel was patent. Duodenum:  Normal bulbar and post bulbar mucosa.  Therapeutic/Diagnostic Maneuvers Performed:  None  Complications:  None  Impression: Small sliding hiatal hernia with changes of reflux esophagitis limited to GE junction along with single erosion. No evidence of gastric ulcer corresponding to CT finding. Healed prepyloric ulcer. Altered anatomy involving proximal stomach secondary to failed gastric lap band.   Recommendations:  Continue double dose PPI.  Advance diet. Will arrange for patient to undergo pancreatic EUS on an outpatient basis.  REHMAN,NAJEEB U  09/20/2012  10:28 AM  CC: Dr. [name], MD & Dr. Rayne Du ref. provider found

## 2012-09-20 NOTE — OR Nursing (Signed)
Dr. Laural Golden notified of patient's blood sugar being 83. Orders for D5NS to be piggybacked in with fluids received and carried out.

## 2012-09-20 NOTE — Progress Notes (Signed)
AVS reviewed with patient.  Prescription for pain medication given to patient.  Pt verbalized understanding of d/c instructions.  Pt stated all belongings intact at d/c.  Teach back method used.  Pt's IVs removed.  Site WNL.  Pt transported by NT via w/c to main entrance for d/c.

## 2012-09-20 NOTE — Progress Notes (Signed)
[facility] PROGRESS NOTE  CALEN ANGLADE [id number] DOB: [birth year] DOA: 09/19/2012 PCP: [name], MD  Assessment/Plan: Epigastric pain: CT concerning for gastric ulcer. Appreciate GI assistance. For EGD this am. States pain slightly improved. Has been NPO since midnight. VSS. Non-toxic appearing. Monitor closely.   Active Problems:   DIABETES MELLITUS, TYPE II, ON INSULIN: hypoglycemic this am. Likely due to NPO status. Will decrease levimir per diabetes recommendation while NPO to avoid hypoglycemia. Will check A1c.   Hypertension: controlled. Continue home meds. Not currently on low dose ACE and probably should be.   Systolic dysfunction/non-ischemic cardiomyopathy with EF 30-35%. Seen 4/18 by cards. Repeat echo pending. Wt 150.9 from 151.5 on admission.  Atrial flutter: seen by cards 4/18. Xareltol started. To follow up with cards in 2 weeks  Acute on chronic renal failure (III). Creatinine improving with gently hydration. Currently close to baseline. Will decrease fluids to 50.hr due to #4. monitor    Morbid obesity: nutritional consult    Multiple myeloma: stable. On steroids      Diarrhea : resolved     Code Status: full Family Communication:  Disposition Plan: Home when ready likely tomorrow am.    Consultants:  GI Dr,. Rehman  Procedures:  EGD  Antibiotics:  none  HPI/Subjective: Awake alert smiling. NAD  Objective: Filed Vitals:   09/19/12 1829 09/19/12 2100 09/20/12 0052 09/20/12 0500  BP: 127/77 124/74  129/54  Pulse: 45 46 68 46  Temp: 97.9 F (36.6 C) 97.6 F (36.4 C)  97.1 F (36.2 C)  TempSrc: Oral Oral  Oral  Resp:  20 20 20   Height: 6\' 3"  (1.905 m)     Weight: 150.594 kg (332 lb)     SpO2: 99% 100% 98% 95%   No intake or output data in the 24 hours ending 09/20/12 0844 Filed Weights   09/19/12 1107 09/19/12 1829  Weight: 150.594 kg (332 lb) 150.594 kg (332 lb)    Exam:   General:  Obese NAD  Cardiovascular:  RRR No MGR trace LE edema  Respiratory: normal effort BS clear bilaterally no wheeze no rhonchi  Abdomen: obese soft +BS mild tenderness epigastric area otherwise non-tender  Musculoskeletal: MAE. No joint swelling/erythema.    Data Reviewed: Basic Metabolic Panel:  Recent Labs Lab 09/17/12 1613 09/19/12 1226 09/20/12 0518  NA 140 139 141  K 3.9 4.0 3.7  CL 108 104 110  CO2 24 24 24   GLUCOSE 209* 177* 59*  BUN 24* 26* 18  CREATININE 1.82* 1.63* 1.47*  CALCIUM 8.6 8.5 7.7*   Liver Function Tests:  Recent Labs Lab 09/15/12 1138 09/19/12 1226 09/20/12 0518  AST 39* 48* 26  ALT 74* 69* 50  ALKPHOS 90 88 71  BILITOT 0.4 0.3 0.3  PROT 5.8* 6.2 5.3*  ALBUMIN 3.7 3.3* 2.8*    Recent Labs Lab 09/15/12 1138 09/19/12 1226  LIPASE 148* 114*  AMYLASE 195*  --    No results found for this basename: AMMONIA,  in the last 168 hours CBC:  Recent Labs Lab 09/19/12 1226 09/20/12 0518  WBC 8.2 8.1  NEUTROABS 6.0  --   HGB 12.6* 11.5*  HCT 37.5* 34.2*  MCV 87.8 88.4  PLT 183 159   Cardiac Enzymes:  Recent Labs Lab 09/19/12 1226  TROPONINI <0.30   BNP (last 3 results) No results found for this basename: PROBNP,  in the last 8760 hours CBG:  Recent Labs Lab 09/19/12 1857 09/19/12 2053 09/20/12 0650 09/20/12 0719  GLUCAP  142* 100* 54* 138*    Recent Results (from the past 240 hour(s))  MRSA PCR SCREENING     Status: None   Collection Time    09/19/12 10:15 PM      Result Value Range Status   MRSA by PCR NEGATIVE  NEGATIVE Final   Comment:            The GeneXpert MRSA Assay (FDA     approved for NASAL specimens     only), is one component of a     comprehensive MRSA colonization     surveillance program. It is not     intended to diagnose MRSA     infection nor to guide or     monitor treatment for     MRSA infections.     Studies: Ct Abdomen Pelvis Wo Contrast  09/19/2012  *RADIOLOGY REPORT*  Clinical Data: Epigastric pain.  CT ABDOMEN AND  PELVIS WITHOUT CONTRAST  Technique:  Multidetector CT imaging of the abdomen and pelvis was performed following the standard protocol without intravenous contrast.  Comparison: 05/18/2011.  Findings: There is a hernia through the left lateral hemidiaphragm. Herniated fat and a splenic flexure of the colon.  No evidence of bowel obstruction.  Linear scarring in the right lung base.  No effusions.  Heart is borderline in size.  Liver, gallbladder, stomach, spleen, pancreas adrenals are unremarkable.  Mild bilateral perinephric stranding, stable since prior study.  No ureteral or renal stones.  No hydronephrosis.  Small bowel is decompressed.  Colon grossly unremarkable.  No free fluid, free air or adenopathy.  Aorta and iliac vessels are calcified, non-aneurysmal.  Urinary bladder and prostate grossly unremarkable.  Within the proximal stomach near the gastroesophageal junction, there is a small collection of air is concerning for possible ulcer within the wall on image 20.  Slight overlying stranding/inflammation.  Degenerative changes in the lumbar spine.  No acute bony abnormality.  IMPRESSION: Left lateral diaphragmatic hernia containing fat and the splenic flexure of the colon.  No evidence of bowel obstruction.  Findings suggestive of small ulcer with overlying inflammation within the proximal stomach near the GE junction (image 20).  Due to its location and size, this likely would not be detectable on an upper GI series.  Consider endoscopic correlation.   Original Report Authenticated By: Rolm Baptise, M.D.    Dg Chest 2 View  09/19/2012  *RADIOLOGY REPORT*  Clinical Data: Multiple myeloma.  Chest pain, shortness of breath.  CHEST - 2 VIEW  Comparison: 10/15/2011  Findings: Left Port-A-Cath remains in place, unchanged.  Heart is borderline in size.  Stable chronic density at the left base, which was shown on prior chest CT to represent a diaphragmatic hernia and herniated fat.  Adjacent lingular scarring.   Right lung is clear.  No effusions or acute bony abnormality.  IMPRESSION: No acute cardiopulmonary disease.   Original Report Authenticated By: Rolm Baptise, M.D.     Scheduled Meds: . acyclovir  400 mg Oral Daily  . allopurinol  300 mg Oral Daily  . atorvastatin  80 mg Oral q1800  . carvedilol  3.125 mg Oral BID WC  . cholecalciferol  2,000 Units Oral Daily  . [START ON 09/24/2012] dexamethasone  20 mg Oral Custom  . diphenoxylate-atropine  2 tablet Oral q morning - 10a  . insulin aspart  0-20 Units Subcutaneous TID WC  . insulin aspart  0-5 Units Subcutaneous QHS  . insulin detemir  35 Units Subcutaneous Daily  . [  START ON 09/26/2012] lenalidomide  5 mg Oral Daily  . lipase/protease/amylase  2 capsule Oral TID WC  . loratadine  10 mg Oral q morning - 10a  . pantoprazole  80 mg Oral Daily  . potassium chloride SA  40 mEq Oral BID  . rivaroxaban  20 mg Oral Daily  . sulfamethoxazole-trimethoprim  1 tablet Oral Q M,W,F  . torsemide  20 mg Oral q morning - 10a   Continuous Infusions: . sodium chloride 100 mL/hr at 09/20/12 0252  . sodium chloride      Principal Problem:     Time spent: 40 minutes    [facility]  If 7PM-7AM, please contact night-coverage at [email or web address], password [facility] 09/20/2012, 8:44 AM  LOS: 1 day   Attending note:  Patient interviewed and examined.  Agree.  Doree Barthel, M.D.

## 2012-09-20 NOTE — Progress Notes (Signed)
Patient ID: [name], male   DOB: 09/25/1960, 52 y.o.   MRN: [id number] Feels soreness lower sternal area. Scheduled for an EGD today for probably gastric ulcer per CT. Remains NPO.

## 2012-09-21 ENCOUNTER — Other Ambulatory Visit: Payer: Self-pay

## 2012-09-21 ENCOUNTER — Telehealth: Payer: Self-pay

## 2012-09-21 ENCOUNTER — Ambulatory Visit (HOSPITAL_COMMUNITY)
Admission: RE | Admit: 2012-09-21 | Discharge: 2012-09-21 | Disposition: A | Discharge status: Home / Self Care | Payer: Managed Care, Other (non HMO) | Source: Ambulatory Visit | Attending: Cardiology | Admitting: Cardiology

## 2012-09-21 ENCOUNTER — Encounter (HOSPITAL_COMMUNITY): Payer: Self-pay | Admitting: *Deleted

## 2012-09-21 ENCOUNTER — Ambulatory Visit (HOSPITAL_COMMUNITY): Payer: Managed Care, Other (non HMO) | Admitting: Oncology

## 2012-09-21 DIAGNOSIS — R109 Unspecified abdominal pain: Secondary | ICD-10-CM

## 2012-09-21 DIAGNOSIS — I10 Essential (primary) hypertension: Secondary | ICD-10-CM | POA: Insufficient documentation

## 2012-09-21 DIAGNOSIS — I517 Cardiomegaly: Secondary | ICD-10-CM

## 2012-09-21 DIAGNOSIS — I509 Heart failure, unspecified: Secondary | ICD-10-CM | POA: Insufficient documentation

## 2012-09-21 DIAGNOSIS — E119 Type 2 diabetes mellitus without complications: Secondary | ICD-10-CM | POA: Insufficient documentation

## 2012-09-21 NOTE — Progress Notes (Signed)
*  PRELIMINARY RESULTS* Echocardiogram 2D Echocardiogram has been performed.  [name] 09/21/2012, 1:38 PM

## 2012-09-22 ENCOUNTER — Telehealth: Payer: Self-pay | Admitting: Cardiology

## 2012-09-22 ENCOUNTER — Encounter: Payer: Self-pay | Admitting: Gastroenterology

## 2012-09-22 ENCOUNTER — Encounter (HOSPITAL_COMMUNITY): Payer: Self-pay | Admitting: Internal Medicine

## 2012-09-22 NOTE — Telephone Encounter (Signed)
They need a letter stating that patient doesn't need to come off xarelto for deep tissue cleaning (teeth)  Please fax to [phone] att sandy

## 2012-09-22 NOTE — Telephone Encounter (Signed)
Please advise if ok to type pt instruction letter based on request below

## 2012-09-22 NOTE — Telephone Encounter (Signed)
Dr Ardis Hughs pt is on Xarelto, can he stay on or should he stop?

## 2012-09-22 NOTE — Telephone Encounter (Signed)
Left message on machine to call back  

## 2012-09-22 NOTE — Telephone Encounter (Signed)
He should hold it in case FNA needed. Will have to send anticoag letter to his PCP or zarelto prescribing care giver. thanks

## 2012-09-23 ENCOUNTER — Encounter: Payer: Self-pay | Admitting: Cardiology

## 2012-09-23 ENCOUNTER — Encounter: Payer: Self-pay | Admitting: *Deleted

## 2012-09-23 NOTE — Telephone Encounter (Signed)
I spoke with Dr Cathey Endow nurse and she put the anti coag letter on Dr [facility] desk for review pt has been instructed and meds reviewed pt will call if he does not here from our office or Dr [name] in 1 week.  Pt will also call with any questions or concerns after reviewing the info in the mail

## 2012-09-23 NOTE — Telephone Encounter (Signed)
In my opinion, the procedure should be done initially without stopping anticoagulation. If bleeding proves to be excessive, xarelto can be held for one day and the procedure repeated.  Jacqulyn Ducking, MD

## 2012-09-23 NOTE — Telephone Encounter (Signed)
Faxed letter with the below instructions from Dr RR to number provided

## 2012-09-27 NOTE — Progress Notes (Signed)
UR Chart Review Completed  

## 2012-09-28 ENCOUNTER — Encounter (HOSPITAL_BASED_OUTPATIENT_CLINIC_OR_DEPARTMENT_OTHER): Payer: Managed Care, Other (non HMO)

## 2012-09-28 ENCOUNTER — Ambulatory Visit (HOSPITAL_COMMUNITY): Payer: Managed Care, Other (non HMO)

## 2012-09-28 VITALS — BP 100/66 | HR 64 | Temp 98.7°F | Resp 18

## 2012-09-28 DIAGNOSIS — C9000 Multiple myeloma not having achieved remission: Secondary | ICD-10-CM

## 2012-09-28 MED ORDER — SODIUM CHLORIDE 0.9 % IV SOLN
INTRAVENOUS | Status: DC
  Administered 2012-09-28: 14:00:00 via INTRAVENOUS

## 2012-09-28 MED ORDER — HEPARIN SOD (PORK) LOCK FLUSH 100 UNIT/ML IV SOLN
500.0000 [IU] | Freq: Once | INTRAVENOUS | Status: AC
  Administered 2012-09-28: 500 [IU] via INTRAVENOUS
  Filled 2012-09-28: qty 5

## 2012-09-28 MED ORDER — ZOLEDRONIC ACID 4 MG/5ML IV CONC
2.0000 mg | Freq: Once | INTRAVENOUS | Status: AC
  Administered 2012-09-28: 2 mg via INTRAVENOUS
  Filled 2012-09-28: qty 2.5

## 2012-09-28 MED ORDER — SODIUM CHLORIDE 0.9 % IJ SOLN
10.0000 mL | INTRAMUSCULAR | Status: DC | PRN
  Administered 2012-09-28: 10 mL via INTRAVENOUS
  Filled 2012-09-28: qty 10

## 2012-09-28 NOTE — Progress Notes (Signed)
Tolerated zometa infusion well.  Specimen sent to lab for tests.

## 2012-09-29 ENCOUNTER — Other Ambulatory Visit (HOSPITAL_COMMUNITY): Payer: Self-pay | Admitting: Oncology

## 2012-09-29 ENCOUNTER — Encounter (HOSPITAL_COMMUNITY): Payer: Self-pay | Admitting: Pharmacy Technician

## 2012-09-29 DIAGNOSIS — C9000 Multiple myeloma not having achieved remission: Secondary | ICD-10-CM

## 2012-09-29 LAB — KAPPA/LAMBDA LIGHT CHAINS
Kappa free light chain: 2.73 mg/dL — ABNORMAL HIGH (ref 0.33–1.94)
Kappa, lambda light chain ratio: 1.16 (ref 0.26–1.65)
Lambda free light chains: 2.36 mg/dL (ref 0.57–2.63)

## 2012-09-29 MED ORDER — DEXAMETHASONE 4 MG PO TABS: 20.0000 mg | ORAL_TABLET | Freq: Two times a day (BID) | ORAL | Status: DC

## 2012-09-30 ENCOUNTER — Telehealth: Payer: Self-pay

## 2012-09-30 LAB — MULTIPLE MYELOMA PANEL, SERUM
Albumin ELP: 58.5 % (ref 55.8–66.1)
Alpha-1-Globulin: 6 % — ABNORMAL HIGH (ref 2.9–4.9)
Alpha-2-Globulin: 13.3 % — ABNORMAL HIGH (ref 7.1–11.8)
Beta 2: 5.6 % (ref 3.2–6.5)
Beta Globulin: 6.9 % (ref 4.7–7.2)
Gamma Globulin: 9.7 % — ABNORMAL LOW (ref 11.1–18.8)
IgA: 78 mg/dL (ref 68–379)
IgG (Immunoglobin G), Serum: 526 mg/dL — ABNORMAL LOW (ref 650–1600)
IgM, Serum: 95 mg/dL (ref 41–251)
M-Spike, %: NOT DETECTED g/dL
Total Protein: 5.7 g/dL — ABNORMAL LOW (ref 6.0–8.3)

## 2012-09-30 NOTE — Telephone Encounter (Signed)
Patty, Given normal pancreas on recent CT, it is unlikely that he will require FNA and so I am ok doing the EUS while he is still on xarelto. He will need to know, however, that is a lesion is noted it cannot be safely biopsied at that time and he will need to come back for second procedure (off of xarelto).  Najeeb and Rob; [employer]

## 2012-09-30 NOTE — Telephone Encounter (Signed)
In my opinion, the procedure should be done initially without stopping anticoagulation. If bleeding proves to be excessive, xarelto can be held for one day and the procedure repeated.  Jacqulyn Ducking, MD    Dr Ardis Hughs do you want to proceed with the EUS on xarelto?

## 2012-09-30 NOTE — Telephone Encounter (Signed)
Pt has been notified to Stay ON xarelto and has been told that any biopsies needed would have to be done at a later time when he can come off of xarelto.  Pt agreed

## 2012-09-30 NOTE — Telephone Encounter (Signed)
Message copied by Barron Alvine on Thu Sep 30, 2012  8:13 AM ------      Message from: Barron Alvine      Created: Wed Sep 22, 2012 11:39 AM       Waiting for anti coag response from Dr [name]  ------

## 2012-09-30 NOTE — Telephone Encounter (Signed)
Left message on machine to call back  

## 2012-09-30 NOTE — Telephone Encounter (Signed)
Left message on machine to call back at work number  Message left with family member to have pt call back

## 2012-10-01 NOTE — Telephone Encounter (Signed)
I agree with plans to first do EUS without stopping Xarelto as he may not need biopsy and if needed can be done at a later date

## 2012-10-03 ENCOUNTER — Emergency Department (HOSPITAL_COMMUNITY)
Admission: EM | Admit: 2012-10-03 | Discharge: 2012-10-03 | Disposition: A | Discharge status: Home / Self Care | Payer: Managed Care, Other (non HMO) | Attending: Emergency Medicine | Admitting: Emergency Medicine

## 2012-10-03 ENCOUNTER — Encounter (HOSPITAL_COMMUNITY): Payer: Self-pay

## 2012-10-03 DIAGNOSIS — K219 Gastro-esophageal reflux disease without esophagitis: Secondary | ICD-10-CM | POA: Insufficient documentation

## 2012-10-03 DIAGNOSIS — I4892 Unspecified atrial flutter: Secondary | ICD-10-CM | POA: Insufficient documentation

## 2012-10-03 DIAGNOSIS — R197 Diarrhea, unspecified: Secondary | ICD-10-CM | POA: Insufficient documentation

## 2012-10-03 DIAGNOSIS — Z794 Long term (current) use of insulin: Secondary | ICD-10-CM | POA: Insufficient documentation

## 2012-10-03 DIAGNOSIS — Z9889 Other specified postprocedural states: Secondary | ICD-10-CM | POA: Insufficient documentation

## 2012-10-03 DIAGNOSIS — E669 Obesity, unspecified: Secondary | ICD-10-CM | POA: Insufficient documentation

## 2012-10-03 DIAGNOSIS — M549 Dorsalgia, unspecified: Secondary | ICD-10-CM | POA: Insufficient documentation

## 2012-10-03 DIAGNOSIS — Z8639 Personal history of other endocrine, nutritional and metabolic disease: Secondary | ICD-10-CM | POA: Insufficient documentation

## 2012-10-03 DIAGNOSIS — Z8679 Personal history of other diseases of the circulatory system: Secondary | ICD-10-CM | POA: Insufficient documentation

## 2012-10-03 DIAGNOSIS — E785 Hyperlipidemia, unspecified: Secondary | ICD-10-CM | POA: Insufficient documentation

## 2012-10-03 DIAGNOSIS — Z872 Personal history of diseases of the skin and subcutaneous tissue: Secondary | ICD-10-CM | POA: Insufficient documentation

## 2012-10-03 DIAGNOSIS — I129 Hypertensive chronic kidney disease with stage 1 through stage 4 chronic kidney disease, or unspecified chronic kidney disease: Secondary | ICD-10-CM | POA: Insufficient documentation

## 2012-10-03 DIAGNOSIS — Z862 Personal history of diseases of the blood and blood-forming organs and certain disorders involving the immune mechanism: Secondary | ICD-10-CM | POA: Insufficient documentation

## 2012-10-03 DIAGNOSIS — C9000 Multiple myeloma not having achieved remission: Secondary | ICD-10-CM | POA: Insufficient documentation

## 2012-10-03 DIAGNOSIS — Z79899 Other long term (current) drug therapy: Secondary | ICD-10-CM | POA: Insufficient documentation

## 2012-10-03 DIAGNOSIS — Z7901 Long term (current) use of anticoagulants: Secondary | ICD-10-CM | POA: Insufficient documentation

## 2012-10-03 DIAGNOSIS — R109 Unspecified abdominal pain: Secondary | ICD-10-CM

## 2012-10-03 DIAGNOSIS — R748 Abnormal levels of other serum enzymes: Secondary | ICD-10-CM | POA: Insufficient documentation

## 2012-10-03 DIAGNOSIS — R112 Nausea with vomiting, unspecified: Secondary | ICD-10-CM | POA: Insufficient documentation

## 2012-10-03 DIAGNOSIS — Z9981 Dependence on supplemental oxygen: Secondary | ICD-10-CM | POA: Insufficient documentation

## 2012-10-03 DIAGNOSIS — G4730 Sleep apnea, unspecified: Secondary | ICD-10-CM | POA: Insufficient documentation

## 2012-10-03 DIAGNOSIS — Z87891 Personal history of nicotine dependence: Secondary | ICD-10-CM | POA: Insufficient documentation

## 2012-10-03 DIAGNOSIS — G8929 Other chronic pain: Secondary | ICD-10-CM | POA: Insufficient documentation

## 2012-10-03 DIAGNOSIS — R1013 Epigastric pain: Secondary | ICD-10-CM | POA: Insufficient documentation

## 2012-10-03 DIAGNOSIS — N183 Chronic kidney disease, stage 3 unspecified: Secondary | ICD-10-CM | POA: Insufficient documentation

## 2012-10-03 LAB — CBC WITH DIFFERENTIAL/PLATELET
Basophils Absolute: 0 10*3/uL (ref 0.0–0.1)
Basophils Relative: 0 % (ref 0–1)
Eosinophils Absolute: 0.4 10*3/uL (ref 0.0–0.7)
Eosinophils Relative: 4 % (ref 0–5)
HCT: 35.3 % — ABNORMAL LOW (ref 39.0–52.0)
Hemoglobin: 11.8 g/dL — ABNORMAL LOW (ref 13.0–17.0)
Lymphocytes Relative: 17 % (ref 12–46)
Lymphs Abs: 1.4 10*3/uL (ref 0.7–4.0)
MCH: 29.2 pg (ref 26.0–34.0)
MCHC: 33.4 g/dL (ref 30.0–36.0)
MCV: 87.4 fL (ref 78.0–100.0)
Monocytes Absolute: 0.7 10*3/uL (ref 0.1–1.0)
Monocytes Relative: 9 % (ref 3–12)
Neutro Abs: 5.9 10*3/uL (ref 1.7–7.7)
Neutrophils Relative %: 70 % (ref 43–77)
Platelets: 163 10*3/uL (ref 150–400)
RBC: 4.04 MIL/uL — ABNORMAL LOW (ref 4.22–5.81)
RDW: 15.4 % (ref 11.5–15.5)
WBC: 8.4 10*3/uL (ref 4.0–10.5)

## 2012-10-03 LAB — URINE MICROSCOPIC-ADD ON

## 2012-10-03 LAB — COMPREHENSIVE METABOLIC PANEL
ALT: 54 U/L — ABNORMAL HIGH (ref 0–53)
AST: 22 U/L (ref 0–37)
Albumin: 3.4 g/dL — ABNORMAL LOW (ref 3.5–5.2)
Alkaline Phosphatase: 71 U/L (ref 39–117)
BUN: 27 mg/dL — ABNORMAL HIGH (ref 6–23)
CO2: 23 mEq/L (ref 19–32)
Calcium: 8.4 mg/dL (ref 8.4–10.5)
Chloride: 108 mEq/L (ref 96–112)
Creatinine, Ser: 1.69 mg/dL — ABNORMAL HIGH (ref 0.50–1.35)
GFR calc Af Amer: 52 mL/min — ABNORMAL LOW (ref >90)
GFR calc non Af Amer: 45 mL/min — ABNORMAL LOW (ref >90)
Glucose, Bld: 125 mg/dL — ABNORMAL HIGH (ref 70–99)
Potassium: 4.1 mEq/L (ref 3.5–5.1)
Sodium: 141 mEq/L (ref 135–145)
Total Bilirubin: 0.3 mg/dL (ref 0.3–1.2)
Total Protein: 6.1 g/dL (ref 6.0–8.3)

## 2012-10-03 LAB — URINALYSIS, ROUTINE W REFLEX MICROSCOPIC
Bilirubin Urine: NEGATIVE
Glucose, UA: NEGATIVE mg/dL
Ketones, ur: NEGATIVE mg/dL
Leukocytes, UA: NEGATIVE
Nitrite: NEGATIVE
Protein, ur: 100 mg/dL — AB
Specific Gravity, Urine: 1.025 (ref 1.005–1.030)
Urobilinogen, UA: 0.2 mg/dL (ref 0.0–1.0)
pH: 5.5 (ref 5.0–8.0)

## 2012-10-03 LAB — LIPASE, BLOOD: Lipase: 74 U/L — ABNORMAL HIGH (ref 11–59)

## 2012-10-03 MED ORDER — SODIUM CHLORIDE 0.9 % IV BOLUS (SEPSIS)
1000.0000 mL | Freq: Once | INTRAVENOUS | Status: AC
  Administered 2012-10-03: 1000 mL via INTRAVENOUS

## 2012-10-03 MED ORDER — PANTOPRAZOLE SODIUM 20 MG PO TBEC: 40.0000 mg | DELAYED_RELEASE_TABLET | Freq: Every day | ORAL | Status: DC

## 2012-10-03 MED ORDER — ONDANSETRON HCL 4 MG/2ML IJ SOLN
4.0000 mg | Freq: Once | INTRAMUSCULAR | Status: AC
  Administered 2012-10-03: 4 mg via INTRAVENOUS
  Filled 2012-10-03: qty 2

## 2012-10-03 MED ORDER — PANTOPRAZOLE SODIUM 40 MG IV SOLR
40.0000 mg | Freq: Once | INTRAVENOUS | Status: AC
  Administered 2012-10-03: 40 mg via INTRAVENOUS
  Filled 2012-10-03: qty 40

## 2012-10-03 MED ORDER — ONDANSETRON HCL 8 MG PO TABS: 8.0000 mg | ORAL_TABLET | ORAL | Status: DC | PRN

## 2012-10-03 MED ORDER — HYDROMORPHONE HCL PF 1 MG/ML IJ SOLN
0.5000 mg | Freq: Once | INTRAMUSCULAR | Status: AC
  Administered 2012-10-03: 0.5 mg via INTRAVENOUS
  Filled 2012-10-03: qty 1

## 2012-10-03 MED ORDER — OXYCODONE-ACETAMINOPHEN 5-325 MG PO TABS: 2.0000 | ORAL_TABLET | ORAL | Status: DC | PRN

## 2012-10-03 NOTE — ED Notes (Signed)
Pt reports ab pain for 1 month, describes as epi-gastric, and burning, no radiation of pain, +nausea, no vomiting, pt reports chronic diarrhea.  No fever. Has taken his oxycodone at 2pm w/ no relief.

## 2012-10-03 NOTE — ED Provider Notes (Signed)
History  This chart was scribed for Nat Christen, MD by Jenne Campus, ED Scribe. This patient was seen in room APA15/AP[facility]15 and the patient's care was started at 5:40 PM.  CSN: [id number]  Arrival date & time 10/03/12  1623   First MD Initiated Contact with Patient 10/03/12 [place]      Chief Complaint  Patient presents with  . Abdominal Pain     The history is provided by the patient. No language interpreter was used.    HPI Comments: [name] is a 52 y.o. male who is currently undergoing chemotherapy treatment for multiple mylemoa diagnosed in 2013 who presents to the Emergency Department complaining of one month of epigastric abdominal pain described as burning that radiates into the upper quadrants bilaterally with associated nausea and one episode of emesis. This flare up started yesterday.He states that this episode is more severe than prior episodes attributed to "elevated pancreas enzymes". He reports that his endocrinologist advises that his symptoms are secondary to the chemotherapy treatment.  He reports that he has taken his oxycodone with no relief.  Pt was seen for the same 2 weeks ago and was admitted with plans to have a EGD performed the next day by Dr. Laural Golden. He admits to having chronic diarrhea secondary to his Creon medications. Last BM was today and was more "solid" than normal. Pt also reports chronic back pain from sciatica but denies fever, weakness, SOB and chills as associated symptoms. He has a h/o HTN, A. Fib, DM, CKD and HLD. Pt is a former smoker but denies alcohol use.   PCP is Dr. [name] Endocrinologist is Dr. [name]    Past Medical History  Diagnosis Date  . Arteriosclerotic cardiovascular disease (ASCVD)     MI-2000s; stent to the proximal LAD and diagonal in 2001; stress nuclear in 2008-impaired exercise capacity, left ventricular dilatation, moderately to severely depressed EF, apical, inferior and anteroseptal scar  . Hypertension   .  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 Diabetes mellitus     Insulin  . GERD (gastroesophageal reflux disease)   . Pedal edema     Venous insufficiency  . Obesity   . Hyperlipidemia   . Injection site reaction   . Cellulitis of leg     both legs  . Chronic kidney disease, stage 3, mod decreased GFR     Creatinine of 1.84 in 06/2011 and 1.5 in 07/2011  . Chronic diarrhea   . Atrial flutter   . Sleep apnea     uses cpap  . Multiple myeloma(203.0) 07/01/2011    Past Surgical History  Procedure Laterality Date  . Laparoscopic gastric banding  2006    has been removed  . Wrist surgery      Left; removal of bone fragment  . Incision and drainage abscess anal    . Abscess drainage      Scrotal  . Bone marrow biopsy  05/13/11  . Portacath placement  07/07/2011    Procedure: INSERTION PORT-A-CATH;  Surgeon: Scherry Ran, MD;  Location: [facility];  Service: General;  Laterality: N/A;  . Colonoscopy  11/28/2011    Procedure: COLONOSCOPY;  Surgeon: Rogene Houston, MD;  Location: [facility];  Service: Endoscopy;  Laterality: N/A;  930  . Esophagogastroduodenoscopy  01/02/2012    Procedure: ESOPHAGOGASTRODUODENOSCOPY (EGD);  Surgeon: Rogene Houston, MD;  Location: [facility];  Service: Endoscopy;  Laterality: N/A;  100  . Esophageal biopsy  01/02/2012    Procedure:  BIOPSY;  Surgeon: Rogene Houston, MD;  Location: [facility];  Service: Endoscopy;  Laterality: N/A;  . Esophagogastroduodenoscopy N/A 09/20/2012    Procedure: ESOPHAGOGASTRODUODENOSCOPY (EGD);  Surgeon: Rogene Houston, MD;  Location: [facility];  Service: Endoscopy;  Laterality: N/A;    Family History  Problem Relation Age of Onset  . Heart disease Mother   . Cancer Mother   . Diabetes Father   . Anesthesia problems Neg Hx   . Hypotension Neg Hx   . Malignant hyperthermia Neg Hx   . Pseudochol deficiency Neg Hx   . Arthritis      History  Substance Use Topics  . Smoking status: Former Smoker    Types:  Cigarettes, Cigars    Quit date: 05/17/2001  . Smokeless tobacco: Never Used  . Alcohol Use: No      Review of Systems  A complete 10 system review of systems was obtained and all systems are negative except as noted in the HPI and PMH.   Allergies  Review of patient's allergies indicates no known allergies.  Home Medications   Current Outpatient Rx  Name  Route  Sig  Dispense  Refill  . acyclovir (ZOVIRAX) 400 MG tablet   Oral   Take 1 tablet (400 mg total) by mouth every morning.   30 tablet   3   . allopurinol (ZYLOPRIM) 300 MG tablet   Oral   Take 1 tablet (300 mg total) by mouth daily.   30 tablet   3   . atorvastatin (LIPITOR) 80 MG tablet   Oral   Take 80 mg by mouth at bedtime.         . Calcium Carbonate-Vit D-Min (CALCIUM 1200 PO)   Oral   Take 1,200 mg by mouth daily.         . carvedilol (COREG) 3.125 MG tablet   Oral   Take 1 tablet (3.125 mg total) by mouth 2 (two) times daily with a meal.   60 tablet   6   . Cholecalciferol (VITAMIN D) 2000 UNITS CAPS   Oral   Take 1 capsule by mouth every morning.          Marland Kitchen dexamethasone (DECADRON) 4 MG tablet   Oral   Take 5 tablets (20 mg total) by mouth 2 (two) times daily. Every Friday   40 tablet   2   . dexlansoprazole (DEXILANT) 60 MG capsule   Oral   Take 60 mg by mouth every morning.          . diphenoxylate-atropine (LOMOTIL) 2.5-0.025 MG per tablet   Oral   Take 2 tablets by mouth every morning. Max 8 per day         . insulin aspart (NOVOLOG) 100 UNIT/ML injection   Subcutaneous   Inject 5-20 Units into the skin 3 (three) times daily before meals. Sliding scale         . insulin detemir (LEVEMIR) 100 UNIT/ML injection   Subcutaneous   Inject 40 Units into the skin daily.         Marland Kitchen lenalidomide ([name]) 5 MG capsule   Oral   Take 1 capsule (5 mg total) by mouth daily.         Marland Kitchen lidocaine-prilocaine (EMLA) cream   Topical   Apply 1 application topically as  needed. To port site. Apply 1 hour prior to treatment. Do not rub in. Cover with plastic.         Marland Kitchen  loperamide (IMODIUM) 2 MG capsule   Oral   Take 1 capsule (2 mg total) by mouth 4 (four) times daily as needed for diarrhea or loose stools.   30 capsule   0   . loratadine (CLARITIN) 10 MG tablet   Oral   Take 10 mg by mouth every morning.          . Multiple Vitamins-Minerals (MULTIVITAMINS THER. W/MINERALS) TABS   Oral   Take 1 tablet by mouth every morning.          . nitroGLYCERIN (NITROSTAT) 0.4 MG SL tablet   Sublingual   Place 1 tablet (0.4 mg total) under the tongue every 5 (five) minutes as needed for chest pain.   25 tablet   3   . oxyCODONE (OXY IR/ROXICODONE) 5 MG immediate release tablet      1-2 tabs every 4 hours as needed for pain   30 tablet   0   . oxyCODONE-acetaminophen (PERCOCET) 7.5-325 MG per tablet   Oral   Take 1 tablet by mouth every 6 (six) hours as needed.   50 tablet   0   . Pancrelipase, Lip-Prot-Amyl, 36000 UNITS CPEP   Oral   Take 36,000-72,000 Units by mouth 3 (three) times daily. Take two with meals and one with snacks         . potassium chloride SA (K-DUR,KLOR-CON) 20 MEQ tablet   Oral   Take 40 mEq by mouth 2 (two) times daily.         . Rivaroxaban (XARELTO) 20 MG TABS   Oral   Take 1 tablet (20 mg total) by mouth daily.   30 tablet   6   . sulfamethoxazole-trimethoprim (BACTRIM DS) 800-160 MG per tablet   Oral   Take 1 tablet by mouth every Monday, Wednesday, and Friday.         . torsemide (DEMADEX) 20 MG tablet   Oral   Take 20 mg by mouth at bedtime.         . Zoledronic Acid (ZOMETA IV)   Intravenous   Inject 2 mg into the vein every 28 (twenty-eight) days. To start Jul 11, 2011           Triage Vitals: BP 138/66  Pulse 61  Temp(Src) 97.3 F (36.3 C) (Oral)  Resp 22  Ht 6\' 3"  (1.905 m)  Wt 332 lb (150.594 kg)  BMI 41.5 kg/m2  SpO2 100%  Physical Exam  Nursing note and vitals  reviewed. Constitutional: He is oriented to person, place, and time. No distress.  Obese   HENT:  Head: Normocephalic and atraumatic.  Eyes: Conjunctivae and EOM are normal. Pupils are equal, round, and reactive to light.  Neck: Normal range of motion. Neck supple.  Cardiovascular: Normal rate, regular rhythm and normal heart sounds.   Pulmonary/Chest: Effort normal and breath sounds normal.  Abdominal: Soft. Bowel sounds are normal. There is tenderness (epigastrium tenderness).  Musculoskeletal: Normal range of motion.  Neurological: He is alert and oriented to person, place, and time.  Skin: Skin is warm and dry.  Psychiatric: He has a normal mood and affect.    ED Course  Procedures (including critical care time)  Medications  sodium chloride 0.9 % bolus 1,000 mL (not administered)  HYDROmorphone (DILAUDID) injection 0.5 mg (not administered)  ondansetron (ZOFRAN) injection 4 mg (not administered)  pantoprazole (PROTONIX) injection 40 mg (not administered)    DIAGNOSTIC STUDIES: Oxygen Saturation is 100% on room air, normal by my  interpretation.    COORDINATION OF CARE: 5:46 PM-Discussed treatment plan which includes medications, CBC panel, CMP and UA with pt at bedside and pt agreed to plan.   Labs Reviewed  COMPREHENSIVE METABOLIC PANEL - Abnormal; Notable for the following:    Glucose, Bld 125 (*)    BUN 27 (*)    Creatinine, Ser 1.69 (*)    Albumin 3.4 (*)    ALT 54 (*)    GFR calc non Af Amer 45 (*)    GFR calc Af Amer 52 (*)    All other components within normal limits  CBC WITH DIFFERENTIAL - Abnormal; Notable for the following:    RBC 4.04 (*)    Hemoglobin 11.8 (*)    HCT 35.3 (*)    All other components within normal limits  LIPASE, BLOOD - Abnormal; Notable for the following:    Lipase 74 (*)    All other components within normal limits  URINALYSIS, ROUTINE W REFLEX MICROSCOPIC - Abnormal; Notable for the following:    Hgb urine dipstick SMALL (*)     Protein, ur 100 (*)    All other components within normal limits  URINE MICROSCOPIC-ADD ON - Abnormal; Notable for the following:    Squamous Epithelial / LPF FEW (*)    All other components within normal limits   No results found.   No diagnosis found.    MDM  Patient feeling much better after hydration and pain management. CT scan reviewed from 09/19/2012. Findings include a left lateral diaphragmatic hernia and findings suggestive a small ulcer within the proximal stomach.   Discharge meds Percocet #30, Protonix 40 mg #30, Zofran 8 mg #15..   patient has  gastroenterology followup   I personally performed the services described in this documentation, which was scribed in my presence. The recorded information has been reviewed and is accurate.       Nat Christen, MD 10/03/12 2245

## 2012-10-05 ENCOUNTER — Ambulatory Visit (INDEPENDENT_AMBULATORY_CARE_PROVIDER_SITE_OTHER): Payer: Managed Care, Other (non HMO) | Admitting: Cardiology

## 2012-10-05 ENCOUNTER — Encounter: Payer: Self-pay | Admitting: Cardiology

## 2012-10-05 ENCOUNTER — Encounter: Payer: Self-pay | Admitting: *Deleted

## 2012-10-05 VITALS — BP 98/70 | HR 68 | Ht 75.0 in | Wt 330.0 lb

## 2012-10-05 DIAGNOSIS — I4892 Unspecified atrial flutter: Secondary | ICD-10-CM

## 2012-10-05 DIAGNOSIS — C9000 Multiple myeloma not having achieved remission: Secondary | ICD-10-CM

## 2012-10-05 DIAGNOSIS — I7090 Unspecified atherosclerosis: Secondary | ICD-10-CM

## 2012-10-05 DIAGNOSIS — E785 Hyperlipidemia, unspecified: Secondary | ICD-10-CM

## 2012-10-05 DIAGNOSIS — I2510 Atherosclerotic heart disease of native coronary artery without angina pectoris: Secondary | ICD-10-CM

## 2012-10-05 DIAGNOSIS — Z01810 Encounter for preprocedural cardiovascular examination: Secondary | ICD-10-CM

## 2012-10-05 DIAGNOSIS — I10 Essential (primary) hypertension: Secondary | ICD-10-CM

## 2012-10-05 NOTE — Assessment & Plan Note (Signed)
Patient notes that he has been told he is doing well with respect to treatment for multiple myeloma. The medications he has received, including chronic steroids, render more difficult treatment of his other medical problems.

## 2012-10-05 NOTE — Progress Notes (Deleted)
Name: [name]    DOB: [birth year]  Age: 52 y.o.  MR#: [id number]       PCP:  [name], MD      Insurance: Payor: [employer]  Plan: [employer] MANAGED  Product Type: *No Product type*    CC:   No chief complaint on file.  LIST VS Filed Vitals:   10/05/12 1309  BP: 98/70  Pulse: 68  Height: 6\' 3"  (1.905 m)  Weight: 330 lb (149.687 kg)    Weights Current Weight  10/05/12 330 lb (149.687 kg)  10/03/12 332 lb (150.594 kg)  09/20/12 332 lb (150.594 kg)    Blood Pressure  BP Readings from Last 3 Encounters:  10/05/12 98/70  10/03/12 121/81  09/28/12 100/66     Admit date:  (Not on file) Last encounter with RMR:  09/22/2012   Allergy Review of patient's allergies indicates no known allergies.  Current Outpatient Prescriptions  Medication Sig Dispense Refill  . acyclovir (ZOVIRAX) 400 MG tablet Take 1 tablet (400 mg total) by mouth every morning.  30 tablet  3  . allopurinol (ZYLOPRIM) 300 MG tablet Take 1 tablet (300 mg total) by mouth daily.  30 tablet  3  . atorvastatin (LIPITOR) 80 MG tablet Take 80 mg by mouth at bedtime.      . Calcium Carbonate-Vit D-Min (CALCIUM 1200 PO) Take 1,200 mg by mouth daily.      . carvedilol (COREG) 3.125 MG tablet Take 1 tablet (3.125 mg total) by mouth 2 (two) times daily with a meal.  60 tablet  6  . Cholecalciferol (VITAMIN D) 2000 UNITS CAPS Take 1 capsule by mouth every morning.       Marland Kitchen dexamethasone (DECADRON) 4 MG tablet Take 5 tablets (20 mg total) by mouth 2 (two) times daily. Every Friday  40 tablet  2  . dexlansoprazole (DEXILANT) 60 MG capsule Take 60 mg by mouth every morning.       . diphenoxylate-atropine (LOMOTIL) 2.5-0.025 MG per tablet Take 2 tablets by mouth every morning. Max 8 per day      . insulin aspart (NOVOLOG) 100 UNIT/ML injection Inject 5-20 Units into the skin 3 (three) times daily before meals. Sliding scale      . insulin detemir (LEVEMIR) 100 UNIT/ML injection Inject 40 Units into the skin daily.      Marland Kitchen  lenalidomide ([name]) 5 MG capsule Take 1 capsule (5 mg total) by mouth daily.      Marland Kitchen lidocaine-prilocaine (EMLA) cream Apply 1 application topically as needed. To port site. Apply 1 hour prior to treatment. Do not rub in. Cover with plastic.      Marland Kitchen loperamide (IMODIUM) 2 MG capsule Take 1 capsule (2 mg total) by mouth 4 (four) times daily as needed for diarrhea or loose stools.  30 capsule  0  . loratadine (CLARITIN) 10 MG tablet Take 10 mg by mouth every morning.       . Multiple Vitamins-Minerals (MULTIVITAMINS THER. W/MINERALS) TABS Take 1 tablet by mouth every morning.       . nitroGLYCERIN (NITROSTAT) 0.4 MG SL tablet Place 1 tablet (0.4 mg total) under the tongue every 5 (five) minutes as needed for chest pain.  25 tablet  3  . ondansetron (ZOFRAN) 8 MG tablet Take 1 tablet (8 mg total) by mouth every 4 (four) hours as needed for nausea.  15 tablet  0  . oxyCODONE (OXY IR/ROXICODONE) 5 MG immediate release tablet 1-2 tabs every 4 hours as  needed for pain  30 tablet  0  . oxyCODONE-acetaminophen (PERCOCET) 5-325 MG per tablet Take 2 tablets by mouth every 4 (four) hours as needed for pain.  30 tablet  0  . oxyCODONE-acetaminophen (PERCOCET) 7.5-325 MG per tablet Take 1 tablet by mouth every 6 (six) hours as needed.  50 tablet  0  . Pancrelipase, Lip-Prot-Amyl, 36000 UNITS CPEP Take [id number] Units by mouth 3 (three) times daily. Take two with meals and one with snacks      . pantoprazole (PROTONIX) 20 MG tablet Take 2 tablets (40 mg total) by mouth daily.  30 tablet  0  . potassium chloride SA (K-DUR,KLOR-CON) 20 MEQ tablet Take 40 mEq by mouth 2 (two) times daily.      . Rivaroxaban (XARELTO) 20 MG TABS Take 20 mg by mouth at bedtime.      . sulfamethoxazole-trimethoprim (BACTRIM DS) 800-160 MG per tablet Take 1 tablet by mouth every Monday, Wednesday, and Friday.      . torsemide (DEMADEX) 20 MG tablet Take 20 mg by mouth at bedtime.      . Zoledronic Acid (ZOMETA IV) Inject 2 mg into  the vein every 28 (twenty-eight) days. To start Jul 11, 2011       No current facility-administered medications for this visit.    Discontinued Meds:   There are no discontinued medications.  Patient Active Problem List   Diagnosis Date Noted  . Atrial flutter 09/17/2012  . DDD (degenerative disc disease), cervical 03/18/2012  . Arteriosclerotic cardiovascular disease (ASCVD)   . Hypertension   . Hyperlipidemia   . Multiple myeloma 07/01/2011  . Morbid obesity 04/29/2010  . DIABETES MELLITUS, TYPE II, ON INSULIN 11/15/2008  . SLEEP APNEA 11/15/2008    LABS    Component Value Date/Time   NA 141 10/03/2012 1751   NA 141 09/20/2012 0518   NA 139 09/19/2012 1226   K 4.1 10/03/2012 1751   K 3.7 09/20/2012 0518   K 4.0 09/19/2012 1226   CL 108 10/03/2012 1751   CL 110 09/20/2012 0518   CL 104 09/19/2012 1226   CO2 23 10/03/2012 1751   CO2 24 09/20/2012 0518   CO2 24 09/19/2012 1226   GLUCOSE 125* 10/03/2012 1751   GLUCOSE 59* 09/20/2012 0518   GLUCOSE 177* 09/19/2012 1226   BUN 27* 10/03/2012 1751   BUN 18 09/20/2012 0518   BUN 26* 09/19/2012 1226   CREATININE 1.69* 10/03/2012 1751   CREATININE 1.47* 09/20/2012 0518   CREATININE 1.63* 09/19/2012 1226   CREATININE 1.82* 09/17/2012 1613   CREATININE 1.60* 10/20/2011 1758   CALCIUM 8.4 10/03/2012 1751   CALCIUM 7.7* 09/20/2012 0518   CALCIUM 8.5 09/19/2012 1226   GFRNONAA 45* 10/03/2012 1751   GFRNONAA 53* 09/20/2012 0518   GFRNONAA 47* 09/19/2012 1226   GFRAA 52* 10/03/2012 1751   GFRAA 61* 09/20/2012 0518   GFRAA 54* 09/19/2012 1226   CMP     Component Value Date/Time   NA 141 10/03/2012 1751   K 4.1 10/03/2012 1751   CL 108 10/03/2012 1751   CO2 23 10/03/2012 1751   GLUCOSE 125* 10/03/2012 1751   BUN 27* 10/03/2012 1751   CREATININE 1.69* 10/03/2012 1751   CREATININE 1.82* 09/17/2012 1613   CALCIUM 8.4 10/03/2012 1751   PROT 6.1 10/03/2012 1751   ALBUMIN 3.4* 10/03/2012 1751   AST 22 10/03/2012 1751   ALT 54* 10/03/2012 1751   ALKPHOS 71 10/03/2012 1751   BILITOT 0.3  10/03/2012 1751  GFRNONAA 45* 10/03/2012 1751   GFRAA 52* 10/03/2012 1751       Component Value Date/Time   WBC 8.4 10/03/2012 1751   WBC 8.1 09/20/2012 0518   WBC 8.2 09/19/2012 1226   HGB 11.8* 10/03/2012 1751   HGB 11.5* 09/20/2012 0518   HGB 12.6* 09/19/2012 1226   HCT 35.3* 10/03/2012 1751   HCT 34.2* 09/20/2012 0518   HCT 37.5* 09/19/2012 1226   MCV 87.4 10/03/2012 1751   MCV 88.4 09/20/2012 0518   MCV 87.8 09/19/2012 1226    Lipid Panel     Component Value Date/Time   CHOL 158 07/18/2010 1724   TRIG 173 07/18/2010 1724   HDL 33 07/18/2010 1724   LDLCALC 90 07/18/2010 1724    ABG No results found for this basename: phart, pco2, pco2art, po2, po2art, hco3, tco2, acidbasedef, o2sat     Lab Results  Component Value Date   TSH 0.561 09/17/2012   BNP (last 3 results) No results found for this basename: PROBNP,  in the last 8760 hours Cardiac Panel (last 3 results) No results found for this basename: CKTOTAL, CKMB, TROPONINI, RELINDX,  in the last 72 hours  Iron/TIBC/Ferritin No results found for this basename: iron, tibc, ferritin     EKG Orders placed during the hospital encounter of 10/03/12  . ED EKG  . ED EKG  . EKG 12-LEAD  . EKG 12-LEAD  . EKG     Prior Assessment and Plan Problem List as of 10/05/2012     ICD-9-CM   DIABETES MELLITUS, TYPE II, ON INSULIN   Last Assessment & Plan   07/28/2011 Office Visit Edited 08/03/2011  8:19 AM by Yehuda Savannah, MD     Diabetic control has been worse since treatment with glucocorticoids initiated.  I suggest allowing somewhat less than optimal control during treatment for multiple myeloma and subsequently aiming for a hemoglobin A1c level of 7.    Morbid obesity   Last Assessment & Plan   07/28/2011 Office Visit Written 07/28/2011  1:11 PM by Yehuda Savannah, MD     Patient is congratulated on a drop in weight loss and encouraged to continue to be as active as possible and to limit caloric intake.      SLEEP APNEA   Multiple  myeloma   Arteriosclerotic cardiovascular disease (ASCVD)   Last Assessment & Plan   09/17/2012 Office Visit Written 09/17/2012  2:49 PM by Lendon Colonel, NP     The patient's most recent echocardiogram revealed an EF of 30-35% in March of 2013. There is no evidence of CHF,. He does have some discomfort she describes as pressure. With hypokalemia noted 10 days ago, and continued diarrhea. I will check a BMET to evaluate his status. EKG reveals frequent PVCs    Hypertension   Last Assessment & Plan   09/17/2012 Office Visit Written 09/17/2012  3:28 PM by Lendon Colonel, NP     Blood pressure is well-controlled currently will not make any changes in his medication regimen. However he will need to be considered for low-dose ACE inhibitor as he is not on this in the setting of ischemic cardiomyopathy and diabetes.    Hyperlipidemia   Last Assessment & Plan   07/28/2011 Office Visit Written 07/28/2011  1:10 PM by Yehuda Savannah, MD     Lipid profiles within the past year have been reasonable.  Current medication will be continued.    DDD (degenerative disc disease), cervical   Atrial  flutter   Last Assessment & Plan   09/17/2012 Office Visit Edited 09/17/2012  3:29 PM by Lendon Colonel, NP     EKG on this visit reveals atrial flutter with 2-1 block. Heart rate is controlled at 62 beats per minute. He continues on Coreg 3.125 mg twice a day. I have asked Dr. Lattie Haw to step in 2 C. this patient with me and he has graciously agreed to do so. The patient's medications were reviewed. He also over read the EKG. We will begin him on Xareltol 20 mg by mouth daily, will repeat his echocardiogram. He will followup with Dr. Lattie Haw 2 weeks. Aspirin will be discontinued in the setting of novel anticoagulation therapy, TSH will be ordered.        Imaging: Ct Abdomen Pelvis Wo Contrast  09/19/2012  *RADIOLOGY REPORT*  Clinical Data: Epigastric pain.  CT ABDOMEN AND PELVIS WITHOUT CONTRAST   Technique:  Multidetector CT imaging of the abdomen and pelvis was performed following the standard protocol without intravenous contrast.  Comparison: 05/18/2011.  Findings: There is a hernia through the left lateral hemidiaphragm. Herniated fat and a splenic flexure of the colon.  No evidence of bowel obstruction.  Linear scarring in the right lung base.  No effusions.  Heart is borderline in size.  Liver, gallbladder, stomach, spleen, pancreas adrenals are unremarkable.  Mild bilateral perinephric stranding, stable since prior study.  No ureteral or renal stones.  No hydronephrosis.  Small bowel is decompressed.  Colon grossly unremarkable.  No free fluid, free air or adenopathy.  Aorta and iliac vessels are calcified, non-aneurysmal.  Urinary bladder and prostate grossly unremarkable.  Within the proximal stomach near the gastroesophageal junction, there is a small collection of air is concerning for possible ulcer within the wall on image 20.  Slight overlying stranding/inflammation.  Degenerative changes in the lumbar spine.  No acute bony abnormality.  IMPRESSION: Left lateral diaphragmatic hernia containing fat and the splenic flexure of the colon.  No evidence of bowel obstruction.  Findings suggestive of small ulcer with overlying inflammation within the proximal stomach near the GE junction (image 20).  Due to its location and size, this likely would not be detectable on an upper GI series.  Consider endoscopic correlation.   Original Report Authenticated By: Rolm Baptise, M.D.    Dg Chest 2 View  09/19/2012  *RADIOLOGY REPORT*  Clinical Data: Multiple myeloma.  Chest pain, shortness of breath.  CHEST - 2 VIEW  Comparison: 10/15/2011  Findings: Left Port-A-Cath remains in place, unchanged.  Heart is borderline in size.  Stable chronic density at the left base, which was shown on prior chest CT to represent a diaphragmatic hernia and herniated fat.  Adjacent lingular scarring.  Right lung is clear.  No  effusions or acute bony abnormality.  IMPRESSION: No acute cardiopulmonary disease.   Original Report Authenticated By: Rolm Baptise, M.D.

## 2012-10-05 NOTE — Assessment & Plan Note (Signed)
Blood pressure control has been excellent. Current medication will be continued.

## 2012-10-05 NOTE — Patient Instructions (Addendum)
Your physician recommends that you schedule a follow-up appointment in: [place] RECOMMENDS THAT YOU [place]  Your physician recommends that you return for lab work in: [place] (BMET,CBC,) SLIPS GIVEN

## 2012-10-05 NOTE — Progress Notes (Signed)
Patient ID: [name], male   DOB: 1960-11-30, 52 y.o.   MRN: [id number]  HPI: Schedule return visit for this very nice gentleman with cardiomyopathy, recent onset atrial arrhythmias and multiple additional medical problems including sleep apnea, hypertension, obesity and multiple myeloma. Despite serious health issues, his performance status is good. 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 He has class II dyspnea on exertion, but no chest discomfort, palpitations, lightheadedness or syncope. He uses a positive pressure mask to treat sleep apnea.    He was evaluated in the emergency department yesterday for epigastric pain. It was noted that he was not taking dexilant, as his insurance refused to provide coverage for that medication. Protonix was started, and symptoms have resolved.  He has had chronic diarrhea in recent months, currently being evaluated by Dr. Laural Golden. Endoscopic ultrasound is planned for tomorrow.  Current Outpatient Prescriptions  Medication Sig Dispense Refill  . acyclovir (ZOVIRAX) 400 MG tablet Take 1 tablet (400 mg total) by mouth every morning.  30 tablet  3  . allopurinol (ZYLOPRIM) 300 MG tablet Take 1 tablet (300 mg total) by mouth daily.  30 tablet  3  . atorvastatin (LIPITOR) 80 MG tablet Take 80 mg by mouth at bedtime.      . Calcium Carbonate-Vit D-Min (CALCIUM 1200 PO) Take 1,200 mg by mouth daily.      . carvedilol (COREG) 3.125 MG tablet Take 1 tablet (3.125 mg total) by mouth 2 (two) times daily with a meal.  60 tablet  6  . Cholecalciferol (VITAMIN D) 2000 UNITS CAPS Take 1 capsule by mouth every morning.       Marland Kitchen dexamethasone (DECADRON) 4 MG tablet Take 5 tablets (20 mg total) by mouth 2 (two) times daily. Every Friday  40 tablet  2  . dexlansoprazole (DEXILANT) 60 MG capsule Take 60 mg by mouth every morning.       . diphenoxylate-atropine (LOMOTIL) 2.5-0.025 MG per tablet Take 2 tablets by mouth every morning. Max 8 per day       . insulin aspart (NOVOLOG) 100 UNIT/ML injection Inject 5-20 Units into the skin 3 (three) times daily before meals. Sliding scale      . insulin detemir (LEVEMIR) 100 UNIT/ML injection Inject 40 Units into the skin daily.      Marland Kitchen lenalidomide ([name]) 5 MG capsule Take 1 capsule (5 mg total) by mouth daily.      Marland Kitchen lidocaine-prilocaine (EMLA) cream Apply 1 application topically as needed. To port site. Apply 1 hour prior to treatment. Do not rub in. Cover with plastic.      Marland Kitchen loperamide (IMODIUM) 2 MG capsule Take 1 capsule (2 mg total) by mouth 4 (four) times daily as needed for diarrhea or loose stools.  30 capsule  0  . loratadine (CLARITIN) 10 MG tablet Take 10 mg by mouth every morning.       . Multiple Vitamins-Minerals (MULTIVITAMINS THER. W/MINERALS) TABS Take 1 tablet by mouth every morning.       . nitroGLYCERIN (NITROSTAT) 0.4 MG SL tablet Place 1 tablet (0.4 mg total) under the tongue every 5 (five) minutes as needed for chest pain.  25 tablet  3  . ondansetron (ZOFRAN) 8 MG tablet Take 1 tablet (8 mg total) by mouth every 4 (four) hours as needed for nausea.  15 tablet  0  . oxyCODONE (OXY IR/ROXICODONE) 5 MG immediate release tablet 1-2 tabs every 4 hours as needed for pain  30  tablet  0  . oxyCODONE-acetaminophen (PERCOCET) 5-325 MG per tablet Take 2 tablets by mouth every 4 (four) hours as needed for pain.  30 tablet  0  . oxyCODONE-acetaminophen (PERCOCET) 7.5-325 MG per tablet Take 1 tablet by mouth every 6 (six) hours as needed.  50 tablet  0  . Pancrelipase, Lip-Prot-Amyl, 36000 UNITS CPEP Take [id number] Units by mouth 3 (three) times daily. Take two with meals and one with snacks      . pantoprazole (PROTONIX) 20 MG tablet Take 2 tablets (40 mg total) by mouth daily.  30 tablet  0  . potassium chloride SA (K-DUR,KLOR-CON) 20 MEQ tablet Take 40 mEq by mouth 2 (two) times daily.      . Rivaroxaban (XARELTO) 20 MG TABS Take 20 mg by mouth at bedtime.      .  sulfamethoxazole-trimethoprim (BACTRIM DS) 800-160 MG per tablet Take 1 tablet by mouth every Monday, Wednesday, and Friday.      . torsemide (DEMADEX) 20 MG tablet Take 20 mg by mouth at bedtime.      . Zoledronic Acid (ZOMETA IV) Inject 2 mg into the vein every 28 (twenty-eight) days. To start Jul 11, 2011       No current facility-administered medications for this visit.   No Known Allergies   Past medical history, social history, and family history reviewed and updated.  ROS: Denies chest pain, nausea, emesis, hematemesis, melena. All other systems reviewed and are negative.  PHYSICAL EXAM: BP 98/70  Pulse 68  Ht 6\' 3"  (1.905 m)  Wt 149.687 kg (330 lb)  BMI 41.25 kg/m2;  Body mass index is 41.25 kg/(m^2). General-Well developed; no acute distress Body habitus-obese Neck-No JVD; no carotid bruits Lungs-clear lung fields; resonant to percussion; decreased breath sounds Cardiovascular-normal PMI; distant S1 and S2 Abdomen-normal bowel sounds; soft and non-tender without masses or organomegaly Musculoskeletal-No deformities, no cyanosis or clubbing Neurologic-Normal cranial nerves; symmetric strength and tone Skin-Warm, no significant lesions Extremities-distal pulses intact; trace edema  EKG: Tracing performed 10/04/12 obtained and reviewed. Atrial fibrillation persists with a ventricular rate of 50 bpm.  Borderline low-voltage; delayed R-wave progression; nonspecific ST-T wave abnormality  Jacqulyn Ducking, MD 10/05/2012  2:18 PM  ASSESSMENT AND PLAN

## 2012-10-05 NOTE — Assessment & Plan Note (Signed)
Recent onset of atrial arrhythmias in the absence of an obvious precipitant beyond his known cardiomyopathy. One attempt at DC cardioversion will be undertaken at patient's earliest convenience. If unsuccessful or if atrial fibrillation recurs rapidly, consideration will be given to long-term strategy of ventricular rate control and anticoagulation.

## 2012-10-05 NOTE — Assessment & Plan Note (Signed)
Patient is doing well symptomatically with respect to ischemic cardiomyopathy, but echocardiogram suggests a further decline in left ventricular systolic function. This may reflect his atrial arrhythmias and may improve after cardioversion. If not, consideration will need to be given to AICD implantation.

## 2012-10-06 ENCOUNTER — Encounter (HOSPITAL_COMMUNITY): Payer: Self-pay | Admitting: Oncology

## 2012-10-06 ENCOUNTER — Encounter (HOSPITAL_COMMUNITY): Payer: Managed Care, Other (non HMO) | Attending: Oncology | Admitting: Oncology

## 2012-10-06 VITALS — BP 100/68 | HR 72 | Temp 98.4°F | Resp 20 | Wt 332.9 lb

## 2012-10-06 DIAGNOSIS — X58XXXA Exposure to other specified factors, initial encounter: Secondary | ICD-10-CM | POA: Insufficient documentation

## 2012-10-06 DIAGNOSIS — E119 Type 2 diabetes mellitus without complications: Secondary | ICD-10-CM

## 2012-10-06 DIAGNOSIS — I4891 Unspecified atrial fibrillation: Secondary | ICD-10-CM

## 2012-10-06 DIAGNOSIS — C9000 Multiple myeloma not having achieved remission: Secondary | ICD-10-CM | POA: Insufficient documentation

## 2012-10-06 NOTE — Patient Instructions (Addendum)
[facility] Discharge Instructions  RECOMMENDATIONS MADE BY THE CONSULTANT AND ANY TEST RESULTS WILL BE SENT TO YOUR REFERRING PHYSICIAN.  Lab work every 4 weeks. Zometa infusion every 4 weeks. Return to see physician in 3 months. Report any issues/concerns to clinic as needed.  Thank you for choosing [facility] to provide your oncology and hematology care.  To afford each patient quality time with our providers, please arrive at least 15 minutes before your scheduled appointment time.  With your help, our goal is to use those 15 minutes to complete the necessary work-up to ensure our physicians have the information they need to help with your evaluation and healthcare recommendations.    Effective January 1st, 2014, we ask that you re-schedule your appointment with our physicians should you arrive 10 or more minutes late for your appointment.  We strive to give you quality time with our providers, and arriving late affects you and other patients whose appointments are after yours.    Again, thank you for choosing [facility].  Our hope is that these requests will decrease the amount of time that you wait before being seen by our physicians.       _____________________________________________________________  Should you have questions after your visit to [facility], please contact our office at [phone] between the hours of 8:30 a.m. and 5:00 p.m.  Voicemails left after 4:30 p.m. will not be returned until the following business day.  For prescription refill requests, have your pharmacy contact our office with your prescription refill request.

## 2012-10-06 NOTE — Progress Notes (Signed)
Franklin Font, MD [place] [place] [address]  Multiple myeloma  CURRENT THERAPY: Lenalidomide 5 mg 7 days on and 7 days off with 20 mg BID every Friday only.  Zometa every 4 weeks.  INTERVAL HISTORY: [name] 52 y.o. male returns for  regular  visit for followup of kappa light chain multiple myeloma, stage II disease presently on maintenance [name] 5 mg daily 7 days on, 7 days off along with weekly dexamethasone on Friday of every week. Zometa every 4 weeks.   [name] was in the ED on 5/4 with abdominal pain.  CT of abdomen was performed revealing  A small ulcer within the proximal stomach near GE junction.  This was considered to be the cause of his abdominal pain.  He was treated symptomatically and discharged from ED with follow-up with GI.  [name] is undergoing cardioversion in the near future by Dr. Lattie Haw for atrial fibrillation.  He is presently on Xarelto.  [name] is scheduled for a endoscopic ultrasound tomorrow.  He has some questions about Protonix versus Dexilant.  We discussed this briefly.  He reports that one physician put him on Dexilant while another prescribed Protonix.  I will defer this to his GI specialist Dr. Laural Golden for managing.  He is presently on the Protonix daily and I have encouraged him to continue with that medication as it will protect his stomach from his ulcer and Dexamethasone.  Hematologically, he is doing well.  He denies any complaints.    I personally reviewed and went over laboratory results with the patient.  His myeloma is in a remission presently.  We will continue with maintenance therapy.  Past Medical History  Diagnosis Date  . Arteriosclerotic cardiovascular disease (ASCVD)     MI-2000s; stent to the proximal LAD and diagonal in 2001; stress nuclear in 2008-impaired exercise capacity, left ventricular dilatation, moderately to severely depressed EF, apical, inferior and anteroseptal scar  . Hypertension   .  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 Diabetes mellitus     Insulin  . GERD (gastroesophageal reflux disease)   . Pedal edema     Venous insufficiency  . Obesity   . Hyperlipidemia   . Injection site reaction   . Cellulitis of leg     both legs  . Chronic kidney disease, stage 3, mod decreased GFR     Creatinine of 1.84 in 06/2011 and 1.5 in 07/2011  . Chronic diarrhea   . Atrial flutter   . Sleep apnea     uses cpap  . Multiple myeloma(203.0) 07/01/2011  . Ulcer     has DIABETES MELLITUS, TYPE II, ON INSULIN; Morbid obesity; SLEEP APNEA; Multiple myeloma; Arteriosclerotic cardiovascular disease (ASCVD); Hypertension; Hyperlipidemia; DDD (degenerative disc disease), cervical; and Atrial flutter on his problem list.     has No Known Allergies.  [name] does not currently have medications on file.  Past Surgical History  Procedure Laterality Date  . Laparoscopic gastric banding  2006    has been removed  . Wrist surgery      Left; removal of bone fragment  . Incision and drainage abscess anal    . Abscess drainage      Scrotal  . Bone marrow biopsy  05/13/11  . Portacath placement  07/07/2011    Procedure: INSERTION PORT-A-CATH;  Surgeon: Scherry Ran, MD;  Location: [facility];  Service: General;  Laterality: N/A;  . Colonoscopy  11/28/2011    Procedure: COLONOSCOPY;  Surgeon: Bernadene Person  Gloriann Loan, MD;  Location: [facility];  Service: Endoscopy;  Laterality: N/A;  930  . Esophagogastroduodenoscopy  01/02/2012    Procedure: ESOPHAGOGASTRODUODENOSCOPY (EGD);  Surgeon: Rogene Houston, MD;  Location: [facility];  Service: Endoscopy;  Laterality: N/A;  100  . Esophageal biopsy  01/02/2012    Procedure: BIOPSY;  Surgeon: Rogene Houston, MD;  Location: [facility];  Service: Endoscopy;  Laterality: N/A;  . Esophagogastroduodenoscopy N/A 09/20/2012    Procedure: ESOPHAGOGASTRODUODENOSCOPY (EGD);  Surgeon: Rogene Houston, MD;  Location: [facility];  Service: Endoscopy;   Laterality: N/A;    Denies any headaches, dizziness, double vision, fevers, chills, night sweats, nausea, vomiting, diarrhea, constipation, chest pain, heart palpitations, shortness of breath, blood in stool, black tarry stool, urinary pain, urinary burning, urinary frequency, hematuria.   PHYSICAL EXAMINATION  ECOG PERFORMANCE STATUS: 1 - Symptomatic but completely ambulatory  Filed Vitals:   10/06/12 1602  BP: 100/68  Pulse: 72  Temp: 98.4 F (36.9 C)  Resp: 20    GENERAL:alert, no distress, well nourished, well developed, comfortable, cooperative, obese and smiling SKIN: skin color, texture, turgor are normal, no rashes or significant lesions HEAD: Normocephalic, No masses, lesions, tenderness or abnormalities EYES: normal, Conjunctiva are pink and non-injected EARS: External ears normal OROPHARYNX:mucous membranes are moist  NECK: supple, trachea midline LYMPH:  not examined BREAST:not examined LUNGS: clear to auscultation and percussion HEART: regular rate & rhythm, no murmurs, no gallops, S1 normal and S2 normal ABDOMEN:abdomen soft, non-tender, obese and normal bowel sounds BACK: Back symmetric, no curvature., No CVA tenderness EXTREMITIES:less then 2 second capillary refill, no joint deformities, effusion, or inflammation, no skin discoloration, no clubbing, no cyanosis  NEURO: alert & oriented x 3 with fluent speech, no focal motor/sensory deficits, gait normal   LABORATORY DATA: CBC    Component Value Date/Time   WBC 8.4 10/03/2012 1751   RBC 4.04* 10/03/2012 1751   HGB 11.8* 10/03/2012 1751   HCT 35.3* 10/03/2012 1751   PLT 163 10/03/2012 1751   MCV 87.4 10/03/2012 1751   MCH 29.2 10/03/2012 1751   MCHC 33.4 10/03/2012 1751   RDW 15.4 10/03/2012 1751   LYMPHSABS 1.4 10/03/2012 1751   MONOABS 0.7 10/03/2012 1751   EOSABS 0.4 10/03/2012 1751   BASOSABS 0.0 10/03/2012 1751      Chemistry      Component Value Date/Time   NA 141 10/03/2012 1751   K 4.1 10/03/2012 1751   CL 108  10/03/2012 1751   CO2 23 10/03/2012 1751   BUN 27* 10/03/2012 1751   CREATININE 1.69* 10/03/2012 1751   CREATININE 1.82* 09/17/2012 1613      Component Value Date/Time   CALCIUM 8.4 10/03/2012 1751   ALKPHOS 71 10/03/2012 1751   AST 22 10/03/2012 1751   ALT 54* 10/03/2012 1751   BILITOT 0.3 10/03/2012 1751     Results for [name], MENDIZABAL (MRN [id number]) as of 10/06/2012 15:51  Ref. Range 09/28/2012 13:16 09/28/2012 13:17  Albumin ELP Latest Range: 55.8-66.1 % 58.5   Alpha-1-Globulin Latest Range: 2.9-4.9 % 6.0 (H)   Alpha-2-Globulin Latest Range: 7.1-11.8 % 13.3 (H)   Beta Globulin Latest Range: 4.7-7.2 % 6.9   Beta 2 Latest Range: 3.2-6.5 % 5.6   Gamma Globulin Latest Range: 11.1-18.8 % 9.7 (L)   M-SPIKE, % No range found NOT DETECTED   SPE Interp. No range found (NOTE)   Comment No range found (NOTE)   IgG (Immunoglobin G), Serum Latest Range:  225-705-5041 mg/dL 526 (L)   IgA Latest Range: 68-379 mg/dL 78   IgM, Serum Latest Range: 41-251 mg/dL 95   Kappa free light chain Latest Range: 0.33-1.94 mg/dL  2.73 (H)  Lamda free light chains Latest Range: 0.57-2.63 mg/dL  2.36  Kappa, lamda light chain ratio Latest Range: 0.26-1.65   1.16     RADIOGRAPHIC STUDIES:  09/19/2012  *RADIOLOGY REPORT*  Clinical Data: Epigastric pain.  CT ABDOMEN AND PELVIS WITHOUT CONTRAST  Technique: Multidetector CT imaging of the abdomen and pelvis was  performed following the standard protocol without intravenous  contrast.  Comparison: 05/18/2011.  Findings: There is a hernia through the left lateral hemidiaphragm.  Herniated fat and a splenic flexure of the colon. No evidence of  bowel obstruction. Linear scarring in the right lung base. No  effusions. Heart is borderline in size.  Liver, gallbladder, stomach, spleen, pancreas adrenals are  unremarkable. Mild bilateral perinephric stranding, stable since  prior study. No ureteral or renal stones. No hydronephrosis.  Small bowel is decompressed. Colon grossly  unremarkable. No free  fluid, free air or adenopathy. Aorta and iliac vessels are  calcified, non-aneurysmal. Urinary bladder and prostate grossly  unremarkable.  Within the proximal stomach near the gastroesophageal junction,  there is a small collection of air is concerning for possible ulcer  within the wall on image 20. Slight overlying  stranding/inflammation.  Degenerative changes in the lumbar spine. No acute bony  abnormality.  IMPRESSION:  Left lateral diaphragmatic hernia containing fat and the splenic  flexure of the colon. No evidence of bowel obstruction.  Findings suggestive of small ulcer with overlying inflammation  within the proximal stomach near the GE junction (image 20). Due  to its location and size, this likely would not be detectable on an  upper GI series. Consider endoscopic correlation.  Original Report Authenticated By: Rolm Baptise, M.D.    ASSESSMENT:  1. Kappa light chain multiple myeloma, stage II disease presently on maintenance [name] 5 mg daily 7 days on, 7 days off along with weekly dexamethasone on Friday of every week. Zometa every 4 weeks. 2. Gastric ulcer, likely cause of epigastric abdominal pain, followed by GI. 3. Recent orchitis bilaterally status post multiple antibiotics, finally improved with IVIG 4. Diabetes mellitus initiated does need to be maintained somewhat better on Fridays Saturdays and Sundays. The sets him up for more frequent infections when it is not controlled. He will consider trying 5 mg of NovoLog insulin Friday evening and then 15 mg as he usually takes it Saturdays and Sundays mornings. Most of the other days he is between 90 and 120 for his sugar. 5. High-volume diarrhea followed by Dr. Laural Golden with multiple causes suspected including [name] 6. Atrial fibrillation, due to be cardioverted in the near future.  Patient Active Problem List   Diagnosis Date Noted  . Atrial flutter 09/17/2012  . DDD (degenerative disc  disease), cervical 03/18/2012  . Arteriosclerotic cardiovascular disease (ASCVD)   . Hypertension   . Hyperlipidemia   . Multiple myeloma 07/01/2011  . Morbid obesity 04/29/2010  . DIABETES MELLITUS, TYPE II, ON INSULIN 11/15/2008  . SLEEP APNEA 11/15/2008     PLAN:  1. I personally reviewed and went over laboratory results with the patient. 2. I personally reviewed and went over radiographic studies with the patient. 3. Labs every 4 weeks with Zometa infusion: CBC diff, CMET, MM panel, Kappa/lambda light chains 4. Patient will be due for Zometa IV in 2-3 weeks. 5. Continue Zometa every  4 weeks. 6. Return in 12 weeks for follow-up.  All questions were answered. The patient knows to call the clinic with any problems, questions or concerns. We can certainly see the patient much sooner if necessary.  The patient and plan discussed with Everardo All, MD and he is in agreement with the aforementioned.  Gawain Crombie

## 2012-10-07 ENCOUNTER — Ambulatory Visit (HOSPITAL_COMMUNITY): Payer: Managed Care, Other (non HMO) | Admitting: *Deleted

## 2012-10-07 ENCOUNTER — Ambulatory Visit (HOSPITAL_COMMUNITY)
Admission: RE | Admit: 2012-10-07 | Discharge: 2012-10-07 | Disposition: A | Discharge status: Home / Self Care | Payer: Managed Care, Other (non HMO) | Source: Ambulatory Visit | Attending: Gastroenterology | Admitting: Gastroenterology

## 2012-10-07 ENCOUNTER — Encounter (HOSPITAL_COMMUNITY): Payer: Self-pay | Admitting: *Deleted

## 2012-10-07 ENCOUNTER — Encounter (HOSPITAL_COMMUNITY)
Admission: RE | Disposition: A | Discharge status: Home / Self Care | Payer: Self-pay | Source: Ambulatory Visit | Attending: Gastroenterology

## 2012-10-07 ENCOUNTER — Encounter (HOSPITAL_COMMUNITY): Payer: Self-pay | Admitting: Gastroenterology

## 2012-10-07 DIAGNOSIS — Z794 Long term (current) use of insulin: Secondary | ICD-10-CM | POA: Insufficient documentation

## 2012-10-07 DIAGNOSIS — I4892 Unspecified atrial flutter: Secondary | ICD-10-CM | POA: Insufficient documentation

## 2012-10-07 DIAGNOSIS — I2510 Atherosclerotic heart disease of native coronary artery without angina pectoris: Secondary | ICD-10-CM | POA: Insufficient documentation

## 2012-10-07 DIAGNOSIS — K219 Gastro-esophageal reflux disease without esophagitis: Secondary | ICD-10-CM | POA: Insufficient documentation

## 2012-10-07 DIAGNOSIS — R748 Abnormal levels of other serum enzymes: Secondary | ICD-10-CM | POA: Insufficient documentation

## 2012-10-07 DIAGNOSIS — K259 Gastric ulcer, unspecified as acute or chronic, without hemorrhage or perforation: Secondary | ICD-10-CM | POA: Insufficient documentation

## 2012-10-07 DIAGNOSIS — N183 Chronic kidney disease, stage 3 unspecified: Secondary | ICD-10-CM | POA: Insufficient documentation

## 2012-10-07 DIAGNOSIS — Z79899 Other long term (current) drug therapy: Secondary | ICD-10-CM | POA: Insufficient documentation

## 2012-10-07 DIAGNOSIS — E785 Hyperlipidemia, unspecified: Secondary | ICD-10-CM | POA: Insufficient documentation

## 2012-10-07 DIAGNOSIS — G4730 Sleep apnea, unspecified: Secondary | ICD-10-CM | POA: Insufficient documentation

## 2012-10-07 DIAGNOSIS — I252 Old myocardial infarction: Secondary | ICD-10-CM | POA: Insufficient documentation

## 2012-10-07 DIAGNOSIS — I129 Hypertensive chronic kidney disease with stage 1 through stage 4 chronic kidney disease, or unspecified chronic kidney disease: Secondary | ICD-10-CM | POA: Insufficient documentation

## 2012-10-07 DIAGNOSIS — I4891 Unspecified atrial fibrillation: Secondary | ICD-10-CM | POA: Insufficient documentation

## 2012-10-07 DIAGNOSIS — K861 Other chronic pancreatitis: Secondary | ICD-10-CM

## 2012-10-07 DIAGNOSIS — E119 Type 2 diabetes mellitus without complications: Secondary | ICD-10-CM | POA: Insufficient documentation

## 2012-10-07 DIAGNOSIS — R935 Abnormal findings on diagnostic imaging of other abdominal regions, including retroperitoneum: Secondary | ICD-10-CM | POA: Insufficient documentation

## 2012-10-07 DIAGNOSIS — C9000 Multiple myeloma not having achieved remission: Secondary | ICD-10-CM | POA: Insufficient documentation

## 2012-10-07 DIAGNOSIS — R109 Unspecified abdominal pain: Secondary | ICD-10-CM | POA: Insufficient documentation

## 2012-10-07 HISTORY — PX: EUS: SHX5427

## 2012-10-07 LAB — GLUCOSE, CAPILLARY
Glucose-Capillary: 73 mg/dL (ref 70–99)
Glucose-Capillary: 68 mg/dL — ABNORMAL LOW (ref 70–99)

## 2012-10-07 SURGERY — UPPER ENDOSCOPIC ULTRASOUND (EUS) LINEAR
Anesthesia: Monitor Anesthesia Care

## 2012-10-07 MED ORDER — BUTAMBEN-TETRACAINE-BENZOCAINE 2-2-14 % EX AERO
INHALATION_SPRAY | CUTANEOUS | Status: DC | PRN
  Administered 2012-10-07: 1 via TOPICAL

## 2012-10-07 MED ORDER — MIDAZOLAM HCL 5 MG/5ML IJ SOLN
INTRAMUSCULAR | Status: DC | PRN
  Administered 2012-10-07: 2 mg via INTRAVENOUS

## 2012-10-07 MED ORDER — LACTATED RINGERS IV SOLN
INTRAVENOUS | Status: DC
  Administered 2012-10-07: 125 mL via INTRAVENOUS

## 2012-10-07 MED ORDER — FENTANYL CITRATE 0.05 MG/ML IJ SOLN
INTRAMUSCULAR | Status: DC | PRN
  Administered 2012-10-07: 25 ug via INTRAVENOUS
  Administered 2012-10-07: 50 ug via INTRAVENOUS

## 2012-10-07 MED ORDER — SODIUM CHLORIDE 0.9 % IV SOLN
INTRAVENOUS | Status: DC
Start: 2012-10-07 — End: 2012-10-07

## 2012-10-07 MED ORDER — PROPOFOL 10 MG/ML IV EMUL
INTRAVENOUS | Status: DC | PRN
  Administered 2012-10-07: 180 ug/kg/min via INTRAVENOUS

## 2012-10-07 NOTE — Anesthesia Postprocedure Evaluation (Signed)
  Anesthesia Post-op Note  Patient: [name]  Procedure(s) Performed: Procedure(s) (LRB): UPPER ENDOSCOPIC ULTRASOUND (EUS) LINEAR (N/A)  Patient Location: PACU  Anesthesia Type: MAC  Level of Consciousness: awake and alert   Airway and Oxygen Therapy: Patient Spontanous Breathing  Post-op Pain: mild  Post-op Assessment: Post-op Vital signs reviewed, Patient's Cardiovascular Status Stable, Respiratory Function Stable, Patent Airway and No signs of Nausea or vomiting  Last Vitals:  Filed Vitals:   10/07/12 1110  BP: 121/49  Pulse:   Temp:   Resp: 19    Post-op Vital Signs: stable   Complications: No apparent anesthesia complications

## 2012-10-07 NOTE — Interval H&P Note (Signed)
History and Physical Interval Note:  10/07/2012 8:26 AM  [name]  has presented today for surgery, with the diagnosis of Abdominal pain [789.00]  The various methods of treatment have been discussed with the patient and family. After consideration of risks, benefits and other options for treatment, the patient has consented to  Procedure(s): UPPER ENDOSCOPIC ULTRASOUND (EUS) LINEAR (N/A) as a surgical intervention .  The patient's history has been reviewed, patient examined, no change in status, stable for surgery.  I have reviewed the patient's chart and labs.  Questions were answered to the patient's satisfaction.     Owens Loffler

## 2012-10-07 NOTE — Op Note (Signed)
[facility] [place], [address]   ENDOSCOPIC ULTRASOUND PROCEDURE REPORT  PATIENT: [name], [name]  MR#: [id number] BIRTHDATE: 25-Aug-1960  GENDER: Male ENDOSCOPIST: Milus Banister, MD REFERRED BY:  Hildred Laser, M.D. PROCEDURE DATE:  10/07/2012 PROCEDURE:   Upper EUS ASA CLASS:      Class III INDICATIONS:   abdominal pain, slightly elevated pancreatic enzymed without clear pancreatic pathology on CT scan. MEDICATIONS: MAC sedation, administered by CRNA  DESCRIPTION OF PROCEDURE:   After the risks benefits and alternatives of the procedure were  explained, informed consent was obtained. The patient was then placed in the left, lateral, decubitus postion and IV sedation was administered. Throughout the procedure, the patients blood pressure, pulse and oxygen saturations were monitored continuously.  Under direct visualization, the Pentax Radial EUS [id number]  endoscope was introduced through the mouth  and advanced to the second portion of the duodenum .  Water was used as necessary to provide an acoustic interface.  Upon completion of the imaging, water was removed and the patient was sent to the recovery room in satisfactory condition.   Endoscopic findings: 1. Normal esophagus 2. Moderate amount of retained solid < liquid food in stomach, without anatomic outlet compromise 3. Normal duodenum  EUS findings: 1. Pancreatic parenchyma throughout the gland was diffusely abnormal. There were numerous hyperechoic strands, foci and overall honeycomb appearance to the gland that was very suggestive of chronic pancreatitis.  No discrete solid or cystic lesions 2. Main pancreatic duct had fairly hyperechoic walls, but it was otherwise normal (non-dilated). 3. Pancreatic divisum was ruled out during this exam 4. No peripancreatic adenopathy 5. CBD was normal; non-dilated and contained no filling defects 6. Gallbladder was  normal  Impression: Several pancreatic features that suggest chronic pancreatitis, however no discrete masses or lesions.  This probably explains his steatorrhea-like loose stools.  Retained food, fluid in stomach without anatomic outlet obstruction, perhaps gastroparesis.   _______________________________ eSignedMilus Banister, MD 10/07/2012 10:33 AM

## 2012-10-07 NOTE — H&P (View-Only) (Signed)
[name], MD [address] [place] [address]  Multiple myeloma  CURRENT THERAPY: Lenalidomide 5 mg 7 days on and 7 days off with 20 mg BID every Friday only.  Zometa every 4 weeks.  INTERVAL HISTORY: TAREZ CARDELLO 52 y.o. male returns for  regular  visit for followup of kappa light chain multiple myeloma, stage II disease presently on maintenance [name] 5 mg daily 7 days on, 7 days off along with weekly dexamethasone on Friday of every week. Zometa every 4 weeks.   Kwmane was in the ED on 5/4 with abdominal pain.  CT of abdomen was performed revealing  A small ulcer within the proximal stomach near GE junction.  This was considered to be the cause of his abdominal pain.  He was treated symptomatically and discharged from ED with follow-up with GI.  Mcgwire is undergoing cardioversion in the near future by Dr. Lattie Haw for atrial fibrillation.  He is presently on Xarelto.  Rafid is scheduled for a endoscopic ultrasound tomorrow.  He has some questions about Protonix versus Dexilant.  We discussed this briefly.  He reports that one physician put him on Dexilant while another prescribed Protonix.  I will defer this to his GI specialist Dr. Laural Golden for managing.  He is presently on the Protonix daily and I have encouraged him to continue with that medication as it will protect his stomach from his ulcer and Dexamethasone.  Hematologically, he is doing well.  He denies any complaints.    I personally reviewed and went over laboratory results with the patient.  His myeloma is in a remission presently.  We will continue with maintenance therapy.  Past Medical History  Diagnosis Date  . Arteriosclerotic cardiovascular disease (ASCVD)     MI-2000s; stent to the proximal LAD and diagonal in 2001; stress nuclear in 2008-impaired exercise capacity, left ventricular dilatation, moderately to severely depressed EF, apical, inferior and anteroseptal scar  . Hypertension   .  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 Diabetes mellitus     Insulin  . GERD (gastroesophageal reflux disease)   . Pedal edema     Venous insufficiency  . Obesity   . Hyperlipidemia   . Injection site reaction   . Cellulitis of leg     both legs  . Chronic kidney disease, stage 3, mod decreased GFR     Creatinine of 1.84 in 06/2011 and 1.5 in 07/2011  . Chronic diarrhea   . Atrial flutter   . Sleep apnea     uses cpap  . Multiple myeloma(203.0) 07/01/2011  . Ulcer     has DIABETES MELLITUS, TYPE II, ON INSULIN; Morbid obesity; SLEEP APNEA; Multiple myeloma; Arteriosclerotic cardiovascular disease (ASCVD); Hypertension; Hyperlipidemia; DDD (degenerative disc disease), cervical; and Atrial flutter on his problem list.     has No Known Allergies.  Mr. Venkataraman does not currently have medications on file.  Past Surgical History  Procedure Laterality Date  . Laparoscopic gastric banding  2006    has been removed  . Wrist surgery      Left; removal of bone fragment  . Incision and drainage abscess anal    . Abscess drainage      Scrotal  . Bone marrow biopsy  05/13/11  . Portacath placement  07/07/2011    Procedure: INSERTION PORT-A-CATH;  Surgeon: Scherry Ran, MD;  Location: [facility];  Service: General;  Laterality: N/A;  . Colonoscopy  11/28/2011    Procedure: COLONOSCOPY;  Surgeon: Bernadene Person  Gloriann Loan, MD;  Location: [facility];  Service: Endoscopy;  Laterality: N/A;  930  . Esophagogastroduodenoscopy  01/02/2012    Procedure: ESOPHAGOGASTRODUODENOSCOPY (EGD);  Surgeon: Rogene Houston, MD;  Location: [facility];  Service: Endoscopy;  Laterality: N/A;  100  . Esophageal biopsy  01/02/2012    Procedure: BIOPSY;  Surgeon: Rogene Houston, MD;  Location: [facility];  Service: Endoscopy;  Laterality: N/A;  . Esophagogastroduodenoscopy N/A 09/20/2012    Procedure: ESOPHAGOGASTRODUODENOSCOPY (EGD);  Surgeon: Rogene Houston, MD;  Location: [facility];  Service: Endoscopy;   Laterality: N/A;    Denies any headaches, dizziness, double vision, fevers, chills, night sweats, nausea, vomiting, diarrhea, constipation, chest pain, heart palpitations, shortness of breath, blood in stool, black tarry stool, urinary pain, urinary burning, urinary frequency, hematuria.   PHYSICAL EXAMINATION  ECOG PERFORMANCE STATUS: 1 - Symptomatic but completely ambulatory  Filed Vitals:   10/06/12 1602  BP: 100/68  Pulse: 72  Temp: 98.4 F (36.9 C)  Resp: 20    GENERAL:alert, no distress, well nourished, well developed, comfortable, cooperative, obese and smiling SKIN: skin color, texture, turgor are normal, no rashes or significant lesions HEAD: Normocephalic, No masses, lesions, tenderness or abnormalities EYES: normal, Conjunctiva are pink and non-injected EARS: External ears normal OROPHARYNX:mucous membranes are moist  NECK: supple, trachea midline LYMPH:  not examined BREAST:not examined LUNGS: clear to auscultation and percussion HEART: regular rate & rhythm, no murmurs, no gallops, S1 normal and S2 normal ABDOMEN:abdomen soft, non-tender, obese and normal bowel sounds BACK: Back symmetric, no curvature., No CVA tenderness EXTREMITIES:less then 2 second capillary refill, no joint deformities, effusion, or inflammation, no skin discoloration, no clubbing, no cyanosis  NEURO: alert & oriented x 3 with fluent speech, no focal motor/sensory deficits, gait normal   LABORATORY DATA: CBC    Component Value Date/Time   WBC 8.4 10/03/2012 1751   RBC 4.04* 10/03/2012 1751   HGB 11.8* 10/03/2012 1751   HCT 35.3* 10/03/2012 1751   PLT 163 10/03/2012 1751   MCV 87.4 10/03/2012 1751   MCH 29.2 10/03/2012 1751   MCHC 33.4 10/03/2012 1751   RDW 15.4 10/03/2012 1751   LYMPHSABS 1.4 10/03/2012 1751   MONOABS 0.7 10/03/2012 1751   EOSABS 0.4 10/03/2012 1751   BASOSABS 0.0 10/03/2012 1751      Chemistry      Component Value Date/Time   NA 141 10/03/2012 1751   K 4.1 10/03/2012 1751   CL 108  10/03/2012 1751   CO2 23 10/03/2012 1751   BUN 27* 10/03/2012 1751   CREATININE 1.69* 10/03/2012 1751   CREATININE 1.82* 09/17/2012 1613      Component Value Date/Time   CALCIUM 8.4 10/03/2012 1751   ALKPHOS 71 10/03/2012 1751   AST 22 10/03/2012 1751   ALT 54* 10/03/2012 1751   BILITOT 0.3 10/03/2012 1751     Results for NEKO, KOMISAR (MRN [id number]) as of 10/06/2012 15:51  Ref. Range 09/28/2012 13:16 09/28/2012 13:17  Albumin ELP Latest Range: 55.8-66.1 % 58.5   Alpha-1-Globulin Latest Range: 2.9-4.9 % 6.0 (H)   Alpha-2-Globulin Latest Range: 7.1-11.8 % 13.3 (H)   Beta Globulin Latest Range: 4.7-7.2 % 6.9   Beta 2 Latest Range: 3.2-6.5 % 5.6   Gamma Globulin Latest Range: 11.1-18.8 % 9.7 (L)   M-SPIKE, % No range found NOT DETECTED   SPE Interp. No range found (NOTE)   Comment No range found (NOTE)   IgG (Immunoglobin G), Serum Latest Range:  [phone] mg/dL 526 (L)   IgA Latest Range: 68-379 mg/dL 78   IgM, Serum Latest Range: 41-251 mg/dL 95   Kappa free light chain Latest Range: 0.33-1.94 mg/dL  2.73 (H)  Lamda free light chains Latest Range: 0.57-2.63 mg/dL  2.36  Kappa, lamda light chain ratio Latest Range: 0.26-1.65   1.16     RADIOGRAPHIC STUDIES:  09/19/2012  *RADIOLOGY REPORT*  Clinical Data: Epigastric pain.  CT ABDOMEN AND PELVIS WITHOUT CONTRAST  Technique: Multidetector CT imaging of the abdomen and pelvis was  performed following the standard protocol without intravenous  contrast.  Comparison: 05/18/2011.  Findings: There is a hernia through the left lateral hemidiaphragm.  Herniated fat and a splenic flexure of the colon. No evidence of  bowel obstruction. Linear scarring in the right lung base. No  effusions. Heart is borderline in size.  Liver, gallbladder, stomach, spleen, pancreas adrenals are  unremarkable. Mild bilateral perinephric stranding, stable since  prior study. No ureteral or renal stones. No hydronephrosis.  Small bowel is decompressed. Colon grossly  unremarkable. No free  fluid, free air or adenopathy. Aorta and iliac vessels are  calcified, non-aneurysmal. Urinary bladder and prostate grossly  unremarkable.  Within the proximal stomach near the gastroesophageal junction,  there is a small collection of air is concerning for possible ulcer  within the wall on image 20. Slight overlying  stranding/inflammation.  Degenerative changes in the lumbar spine. No acute bony  abnormality.  IMPRESSION:  Left lateral diaphragmatic hernia containing fat and the splenic  flexure of the colon. No evidence of bowel obstruction.  Findings suggestive of small ulcer with overlying inflammation  within the proximal stomach near the GE junction (image 20). Due  to its location and size, this likely would not be detectable on an  upper GI series. Consider endoscopic correlation.  Original Report Authenticated By: Rolm Baptise, M.D.    ASSESSMENT:  1. Kappa light chain multiple myeloma, stage II disease presently on maintenance [name] 5 mg daily 7 days on, 7 days off along with weekly dexamethasone on Friday of every week. Zometa every 4 weeks. 2. Gastric ulcer, likely cause of epigastric abdominal pain, followed by GI. 3. Recent orchitis bilaterally status post multiple antibiotics, finally improved with IVIG 4. Diabetes mellitus initiated does need to be maintained somewhat better on Fridays Saturdays and Sundays. The sets him up for more frequent infections when it is not controlled. He will consider trying 5 mg of NovoLog insulin Friday evening and then 15 mg as he usually takes it Saturdays and Sundays mornings. Most of the other days he is between 90 and 120 for his sugar. 5. High-volume diarrhea followed by Dr. Laural Golden with multiple causes suspected including [name] 6. Atrial fibrillation, due to be cardioverted in the near future.  Patient Active Problem List   Diagnosis Date Noted  . Atrial flutter 09/17/2012  . DDD (degenerative disc  disease), cervical 03/18/2012  . Arteriosclerotic cardiovascular disease (ASCVD)   . Hypertension   . Hyperlipidemia   . Multiple myeloma 07/01/2011  . Morbid obesity 04/29/2010  . DIABETES MELLITUS, TYPE II, ON INSULIN 11/15/2008  . SLEEP APNEA 11/15/2008     PLAN:  1. I personally reviewed and went over laboratory results with the patient. 2. I personally reviewed and went over radiographic studies with the patient. 3. Labs every 4 weeks with Zometa infusion: CBC diff, CMET, MM panel, Kappa/lambda light chains 4. Patient will be due for Zometa IV in 2-3 weeks. 5. Continue Zometa every  4 weeks. 6. Return in 12 weeks for follow-up.  All questions were answered. The patient knows to call the clinic with any problems, questions or concerns. We can certainly see the patient much sooner if necessary.  The patient and plan discussed with Everardo All, MD and he is in agreement with the aforementioned.  Emil Weigold

## 2012-10-07 NOTE — Transfer of Care (Signed)
Immediate Anesthesia Transfer of Care Note  Patient: [name]  Procedure(s) Performed: Procedure(s): UPPER ENDOSCOPIC ULTRASOUND (EUS) LINEAR (N/A)  Patient Location: PACU and Endoscopy Unit  Anesthesia Type:MAC  Level of Consciousness: awake, alert , oriented and patient cooperative  Airway & Oxygen Therapy: Patient Spontanous Breathing and Patient connected to nasal cannula oxygen  Post-op Assessment: Report given to PACU RN, Post -op Vital signs reviewed and stable and Patient moving all extremities  Post vital signs: Reviewed and stable  Complications: No apparent anesthesia complications

## 2012-10-07 NOTE — Anesthesia Preprocedure Evaluation (Addendum)
Anesthesia Evaluation  Patient identified by MRN, date of birth, ID band Patient awake    Reviewed: Allergy & Precautions, H&P , NPO status , Patient's Chart, lab work & pertinent test results  Airway Mallampati: II TM Distance: >3 FB     Dental  (+) Teeth Intact and Caps   Pulmonary sleep apnea and Continuous Positive Airway Pressure Ventilation ,  breath sounds clear to auscultation        Cardiovascular hypertension, Pt. on medications + CAD, + Past MI and + Cardiac Stents + dysrhythmias Atrial Fibrillation Rhythm:Regular Rate:Normal     Neuro/Psych    GI/Hepatic GERD-  Medicated and Controlled,  Endo/Other  diabetes, Insulin DependentMorbid obesity  Renal/GU Renal InsufficiencyRenal disease     Musculoskeletal   Abdominal   Peds  Hematology Multiple myeloma   Anesthesia Other Findings   Reproductive/Obstetrics                          Anesthesia Physical  Anesthesia Plan  ASA: III  Anesthesia Plan: MAC   Post-op Pain Management:    Induction: Intravenous  Airway Management Planned: Simple Face Mask  Additional Equipment:   Intra-op Plan:   Post-operative Plan:   Informed Consent: I have reviewed the patients History and Physical, chart, labs and discussed the procedure including the risks, benefits and alternatives for the proposed anesthesia with the patient or authorized representative who has indicated his/her understanding and acceptance.     Plan Discussed with:   Anesthesia Plan Comments:         Anesthesia Quick Evaluation

## 2012-10-08 ENCOUNTER — Encounter (HOSPITAL_COMMUNITY): Payer: Self-pay | Admitting: Gastroenterology

## 2012-10-08 ENCOUNTER — Other Ambulatory Visit (HOSPITAL_COMMUNITY): Payer: Self-pay | Admitting: Oncology

## 2012-10-08 ENCOUNTER — Telehealth (HOSPITAL_COMMUNITY): Payer: Self-pay

## 2012-10-08 DIAGNOSIS — C9000 Multiple myeloma not having achieved remission: Secondary | ICD-10-CM

## 2012-10-08 MED ORDER — LENALIDOMIDE 5 MG PO CAPS: ORAL_CAPSULE | ORAL | Status: DC

## 2012-10-08 NOTE — Telephone Encounter (Signed)
Per patient - he's taking [name] 5 mg 7 days on 7 days off.

## 2012-10-09 ENCOUNTER — Encounter: Payer: Self-pay | Admitting: Cardiology

## 2012-10-09 DIAGNOSIS — D649 Anemia, unspecified: Secondary | ICD-10-CM | POA: Insufficient documentation

## 2012-10-09 LAB — CBC
HCT: 36.2 % — ABNORMAL LOW (ref 39.0–52.0)
Hemoglobin: 12.2 g/dL — ABNORMAL LOW (ref 13.0–17.0)
MCH: 28.8 pg (ref 26.0–34.0)
MCHC: 33.7 g/dL (ref 30.0–36.0)
MCV: 85.6 fL (ref 78.0–100.0)
Platelets: 186 10*3/uL (ref 150–400)
RBC: 4.23 MIL/uL (ref 4.22–5.81)
RDW: 16.5 % — ABNORMAL HIGH (ref 11.5–15.5)
WBC: 11.8 10*3/uL — ABNORMAL HIGH (ref 4.0–10.5)

## 2012-10-10 LAB — BASIC METABOLIC PANEL
BUN: 23 mg/dL (ref 6–23)
CO2: 17 mEq/L — ABNORMAL LOW (ref 19–32)
Calcium: 9 mg/dL (ref 8.4–10.5)
Chloride: 111 mEq/L (ref 96–112)
Creat: 2.24 mg/dL — ABNORMAL HIGH (ref 0.50–1.35)
Glucose, Bld: 203 mg/dL — ABNORMAL HIGH (ref 70–99)
Potassium: 5 mEq/L (ref 3.5–5.3)
Sodium: 140 mEq/L (ref 135–145)

## 2012-10-11 ENCOUNTER — Other Ambulatory Visit (HOSPITAL_COMMUNITY): Payer: Self-pay | Admitting: Oncology

## 2012-10-11 ENCOUNTER — Encounter (HOSPITAL_COMMUNITY): Payer: Self-pay | Admitting: Pharmacy Technician

## 2012-10-11 DIAGNOSIS — R197 Diarrhea, unspecified: Secondary | ICD-10-CM

## 2012-10-11 MED ORDER — DIPHENOXYLATE-ATROPINE 2.5-0.025 MG PO TABS: 2.0000 | ORAL_TABLET | Freq: Every morning | ORAL | Status: DC

## 2012-10-12 ENCOUNTER — Telehealth: Payer: Self-pay | Admitting: *Deleted

## 2012-10-12 NOTE — OR Nursing (Signed)
Spoke with [name] in [facility] in regards to cardioversion on schedule for  10/13/2012. Need orders and H&P for procedure. Per Terri she will let nurse know.

## 2012-10-12 NOTE — Telephone Encounter (Signed)
Received incoming call for pt orders to be placed in chart for pt cardioversion scheduled tomorrow 10-13-12, spoke to Dr RR and noted orders will be placed in chart

## 2012-10-13 ENCOUNTER — Ambulatory Visit (HOSPITAL_COMMUNITY): Payer: Managed Care, Other (non HMO) | Admitting: Anesthesiology

## 2012-10-13 ENCOUNTER — Encounter (HOSPITAL_COMMUNITY)
Admission: RE | Disposition: A | Discharge status: Home / Self Care | Payer: Self-pay | Source: Ambulatory Visit | Attending: Cardiology

## 2012-10-13 ENCOUNTER — Encounter (HOSPITAL_COMMUNITY): Payer: Self-pay | Admitting: *Deleted

## 2012-10-13 ENCOUNTER — Encounter (HOSPITAL_COMMUNITY): Payer: Self-pay | Admitting: Anesthesiology

## 2012-10-13 ENCOUNTER — Ambulatory Visit (HOSPITAL_COMMUNITY)
Admission: RE | Admit: 2012-10-13 | Discharge: 2012-10-13 | Disposition: A | Discharge status: Home / Self Care | Payer: Managed Care, Other (non HMO) | Source: Ambulatory Visit | Attending: Cardiology | Admitting: Cardiology

## 2012-10-13 DIAGNOSIS — Z7901 Long term (current) use of anticoagulants: Secondary | ICD-10-CM | POA: Insufficient documentation

## 2012-10-13 DIAGNOSIS — I10 Essential (primary) hypertension: Secondary | ICD-10-CM | POA: Insufficient documentation

## 2012-10-13 DIAGNOSIS — I428 Other cardiomyopathies: Secondary | ICD-10-CM | POA: Insufficient documentation

## 2012-10-13 DIAGNOSIS — C9000 Multiple myeloma not having achieved remission: Secondary | ICD-10-CM | POA: Insufficient documentation

## 2012-10-13 DIAGNOSIS — Z79899 Other long term (current) drug therapy: Secondary | ICD-10-CM | POA: Insufficient documentation

## 2012-10-13 DIAGNOSIS — E669 Obesity, unspecified: Secondary | ICD-10-CM | POA: Insufficient documentation

## 2012-10-13 DIAGNOSIS — G4730 Sleep apnea, unspecified: Secondary | ICD-10-CM | POA: Insufficient documentation

## 2012-10-13 DIAGNOSIS — R1013 Epigastric pain: Secondary | ICD-10-CM | POA: Insufficient documentation

## 2012-10-13 DIAGNOSIS — I4891 Unspecified atrial fibrillation: Secondary | ICD-10-CM | POA: Insufficient documentation

## 2012-10-13 DIAGNOSIS — R197 Diarrhea, unspecified: Secondary | ICD-10-CM | POA: Insufficient documentation

## 2012-10-13 DIAGNOSIS — I4892 Unspecified atrial flutter: Secondary | ICD-10-CM | POA: Insufficient documentation

## 2012-10-13 DIAGNOSIS — Z6841 Body Mass Index (BMI) 40.0 and over, adult: Secondary | ICD-10-CM | POA: Insufficient documentation

## 2012-10-13 HISTORY — PX: CARDIOVERSION: SHX1299

## 2012-10-13 LAB — GLUCOSE, CAPILLARY: Glucose-Capillary: 93 mg/dL (ref 70–99)

## 2012-10-13 SURGERY — CARDIOVERSION
Anesthesia: Monitor Anesthesia Care

## 2012-10-13 MED ORDER — MIDAZOLAM HCL 2 MG/2ML IJ SOLN
INTRAMUSCULAR | Status: AC
  Filled 2012-10-13: qty 2

## 2012-10-13 MED ORDER — MEPERIDINE HCL 25 MG/ML IJ SOLN: 6.2500 mg | INTRAMUSCULAR | Status: DC | PRN

## 2012-10-13 MED ORDER — SODIUM CHLORIDE 0.9 % IJ SOLN: 3.0000 mL | INTRAMUSCULAR | Status: DC | PRN

## 2012-10-13 MED ORDER — MIDAZOLAM HCL 5 MG/5ML IJ SOLN
INTRAMUSCULAR | Status: DC | PRN
  Administered 2012-10-13: 2 mg via INTRAVENOUS

## 2012-10-13 MED ORDER — SODIUM CHLORIDE 0.9 % IV SOLN
250.0000 mL | INTRAVENOUS | Status: DC
  Administered 2012-10-13: 250 mL via INTRAVENOUS

## 2012-10-13 MED ORDER — PROPOFOL INFUSION 10 MG/ML OPTIME
INTRAVENOUS | Status: DC | PRN
  Administered 2012-10-13: 150 ug/kg/min via INTRAVENOUS

## 2012-10-13 MED ORDER — ONDANSETRON HCL 4 MG/2ML IJ SOLN: 4.0000 mg | Freq: Once | INTRAMUSCULAR | Status: DC | PRN

## 2012-10-13 MED ORDER — FENTANYL CITRATE 0.05 MG/ML IJ SOLN
INTRAMUSCULAR | Status: AC
  Filled 2012-10-13: qty 2

## 2012-10-13 MED ORDER — FENTANYL CITRATE 0.05 MG/ML IJ SOLN
25.0000 ug | INTRAMUSCULAR | Status: DC | PRN
  Administered 2012-10-13: 25 ug via INTRAVENOUS

## 2012-10-13 MED ORDER — LACTATED RINGERS IV SOLN: INTRAVENOUS | Status: DC

## 2012-10-13 MED ORDER — FENTANYL CITRATE 0.05 MG/ML IJ SOLN: 25.0000 ug | INTRAMUSCULAR | Status: DC | PRN

## 2012-10-13 MED ORDER — SODIUM CHLORIDE 0.9 % IJ SOLN: 3.0000 mL | Freq: Two times a day (BID) | INTRAMUSCULAR | Status: DC

## 2012-10-13 MED ORDER — LACTATED RINGERS IV SOLN
INTRAVENOUS | Status: DC
Start: 2012-10-13 — End: 2012-10-13
  Administered 2012-10-13: 10:00:00 via INTRAVENOUS

## 2012-10-13 MED ORDER — FENTANYL CITRATE 0.05 MG/ML IJ SOLN
INTRAMUSCULAR | Status: DC | PRN
  Administered 2012-10-13: 50 ug via INTRAVENOUS

## 2012-10-13 MED ORDER — PROMETHAZINE HCL 25 MG/ML IJ SOLN: 6.2500 mg | INTRAMUSCULAR | Status: DC | PRN

## 2012-10-13 NOTE — CV Procedure (Signed)
Procedure Note-Cardioversion [name] [id number] [birth year]  Procedure: DC Cardioversion Indications:  Atrial Flutter  Procedure Details Consent: Risks of procedure as well as the alternatives and risks of each were explained to the (patient/caregiver).  Consent for procedure obtained. Time Out:  verified correct patient position, romazicon and intubation equipment available, meds/allergies/history reviewed, theraputic anticoagulation verified.  Cardiac monitor, pulse oximetry, supplemental oxygen.  Sedation given: midazolam Pad electrodes placed anterior and posterior chest.  Cardioverted 1 time(s).  Cardioverted at [facility].  Evaluation Findings: Post procedure EKG shows: sinus bradycardia with PVCs Complications: None Patient did tolerate procedure well.   Jacqulyn Ducking 10/13/2012, 10:14 AM

## 2012-10-13 NOTE — H&P (View-Only) (Signed)
Patient ID: [name], male   DOB: 1960/10/30, 52 y.o.   MRN: [id number]  HPI: Schedule return visit for this very nice gentleman with cardiomyopathy, recent onset atrial arrhythmias and multiple additional medical problems including sleep apnea, hypertension, obesity and multiple myeloma. Despite serious health issues, his performance status is good. 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 He has class II dyspnea on exertion, but no chest discomfort, palpitations, lightheadedness or syncope. He uses a positive pressure mask to treat sleep apnea.    He was evaluated in the emergency department yesterday for epigastric pain. It was noted that he was not taking dexilant, as his insurance refused to provide coverage for that medication. Protonix was started, and symptoms have resolved.  He has had chronic diarrhea in recent months, currently being evaluated by Dr. Laural Golden. Endoscopic ultrasound is planned for tomorrow.  Current Outpatient Prescriptions  Medication Sig Dispense Refill  . acyclovir (ZOVIRAX) 400 MG tablet Take 1 tablet (400 mg total) by mouth every morning.  30 tablet  3  . allopurinol (ZYLOPRIM) 300 MG tablet Take 1 tablet (300 mg total) by mouth daily.  30 tablet  3  . atorvastatin (LIPITOR) 80 MG tablet Take 80 mg by mouth at bedtime.      . Calcium Carbonate-Vit D-Min (CALCIUM 1200 PO) Take 1,200 mg by mouth daily.      . carvedilol (COREG) 3.125 MG tablet Take 1 tablet (3.125 mg total) by mouth 2 (two) times daily with a meal.  60 tablet  6  . Cholecalciferol (VITAMIN D) 2000 UNITS CAPS Take 1 capsule by mouth every morning.       Marland Kitchen dexamethasone (DECADRON) 4 MG tablet Take 5 tablets (20 mg total) by mouth 2 (two) times daily. Every Friday  40 tablet  2  . dexlansoprazole (DEXILANT) 60 MG capsule Take 60 mg by mouth every morning.       . diphenoxylate-atropine (LOMOTIL) 2.5-0.025 MG per tablet Take 2 tablets by mouth every morning. Max 8 per day       . insulin aspart (NOVOLOG) 100 UNIT/ML injection Inject 5-20 Units into the skin 3 (three) times daily before meals. Sliding scale      . insulin detemir (LEVEMIR) 100 UNIT/ML injection Inject 40 Units into the skin daily.      Marland Kitchen lenalidomide ([name]) 5 MG capsule Take 1 capsule (5 mg total) by mouth daily.      Marland Kitchen lidocaine-prilocaine (EMLA) cream Apply 1 application topically as needed. To port site. Apply 1 hour prior to treatment. Do not rub in. Cover with plastic.      Marland Kitchen loperamide (IMODIUM) 2 MG capsule Take 1 capsule (2 mg total) by mouth 4 (four) times daily as needed for diarrhea or loose stools.  30 capsule  0  . loratadine (CLARITIN) 10 MG tablet Take 10 mg by mouth every morning.       . Multiple Vitamins-Minerals (MULTIVITAMINS THER. W/MINERALS) TABS Take 1 tablet by mouth every morning.       . nitroGLYCERIN (NITROSTAT) 0.4 MG SL tablet Place 1 tablet (0.4 mg total) under the tongue every 5 (five) minutes as needed for chest pain.  25 tablet  3  . ondansetron (ZOFRAN) 8 MG tablet Take 1 tablet (8 mg total) by mouth every 4 (four) hours as needed for nausea.  15 tablet  0  . oxyCODONE (OXY IR/ROXICODONE) 5 MG immediate release tablet 1-2 tabs every 4 hours as needed for pain  30  tablet  0  . oxyCODONE-acetaminophen (PERCOCET) 5-325 MG per tablet Take 2 tablets by mouth every 4 (four) hours as needed for pain.  30 tablet  0  . oxyCODONE-acetaminophen (PERCOCET) 7.5-325 MG per tablet Take 1 tablet by mouth every 6 (six) hours as needed.  50 tablet  0  . Pancrelipase, Lip-Prot-Amyl, 36000 UNITS CPEP Take [id number] Units by mouth 3 (three) times daily. Take two with meals and one with snacks      . pantoprazole (PROTONIX) 20 MG tablet Take 2 tablets (40 mg total) by mouth daily.  30 tablet  0  . potassium chloride SA (K-DUR,KLOR-CON) 20 MEQ tablet Take 40 mEq by mouth 2 (two) times daily.      . Rivaroxaban (XARELTO) 20 MG TABS Take 20 mg by mouth at bedtime.      .  sulfamethoxazole-trimethoprim (BACTRIM DS) 800-160 MG per tablet Take 1 tablet by mouth every Monday, Wednesday, and Friday.      . torsemide (DEMADEX) 20 MG tablet Take 20 mg by mouth at bedtime.      . Zoledronic Acid (ZOMETA IV) Inject 2 mg into the vein every 28 (twenty-eight) days. To start Jul 11, 2011       No current facility-administered medications for this visit.   No Known Allergies   Past medical history, social history, and family history reviewed and updated.  ROS: Denies chest pain, nausea, emesis, hematemesis, melena. All other systems reviewed and are negative.  PHYSICAL EXAM: BP 98/70  Pulse 68  Ht 6\' 3"  (1.905 m)  Wt 149.687 kg (330 lb)  BMI 41.25 kg/m2;  Body mass index is 41.25 kg/(m^2). General-Well developed; no acute distress Body habitus-obese Neck-No JVD; no carotid bruits Lungs-clear lung fields; resonant to percussion; decreased breath sounds Cardiovascular-normal PMI; distant S1 and S2 Abdomen-normal bowel sounds; soft and non-tender without masses or organomegaly Musculoskeletal-No deformities, no cyanosis or clubbing Neurologic-Normal cranial nerves; symmetric strength and tone Skin-Warm, no significant lesions Extremities-distal pulses intact; trace edema  EKG: Tracing performed 10/04/12 obtained and reviewed. Atrial fibrillation persists with a ventricular rate of 50 bpm.  Borderline low-voltage; delayed R-wave progression; nonspecific ST-T wave abnormality  Jacqulyn Ducking, MD 10/05/2012  2:18 PM  ASSESSMENT AND PLAN

## 2012-10-13 NOTE — Anesthesia Preprocedure Evaluation (Signed)
Anesthesia Evaluation  Patient identified by MRN, date of birth, ID band Patient awake    Reviewed: Allergy & Precautions, H&P , NPO status , Patient's Chart, lab work & pertinent test results  Airway Mallampati: II TM Distance: >3 FB     Dental  (+) Teeth Intact and Caps   Pulmonary sleep apnea and Continuous Positive Airway Pressure Ventilation ,  breath sounds clear to auscultation        Cardiovascular hypertension, Pt. on medications + CAD and + Past MI Rhythm:Regular Rate:Normal     Neuro/Psych    GI/Hepatic GERD-  Medicated and Controlled,  Endo/Other  diabetes, Well Controlled, Type 2, Oral Hypoglycemic Agents  Renal/GU Renal InsufficiencyRenal disease     Musculoskeletal   Abdominal   Peds  Hematology   Anesthesia Other Findings   Reproductive/Obstetrics                           Anesthesia Physical Anesthesia Plan  ASA: III  Anesthesia Plan: MAC   Post-op Pain Management:    Induction: Intravenous  Airway Management Planned: Simple Face Mask  Additional Equipment:   Intra-op Plan:   Post-operative Plan:   Informed Consent: I have reviewed the patients History and Physical, chart, labs and discussed the procedure including the risks, benefits and alternatives for the proposed anesthesia with the patient or authorized representative who has indicated his/her understanding and acceptance.     Plan Discussed with:   Anesthesia Plan Comments:         Anesthesia Quick Evaluation

## 2012-10-13 NOTE — Progress Notes (Signed)
Defibrillator pads placed anterior posterior position by Dr. Lattie Haw.  Synchronized cardioversion performed at 200 joules at 1012.  Pt converted to NSR from atrial flutter.  Skin WNL post procedure.  Will cont to monitor.

## 2012-10-13 NOTE — Anesthesia Postprocedure Evaluation (Signed)
  Anesthesia Post-op Note  Patient: [name]  Procedure(s) Performed: Procedure(s): CARDIOVERSION (N/A)  Patient Location: PACU  Anesthesia Type:MAC  Level of Consciousness: awake, alert , oriented and patient cooperative  Airway and Oxygen Therapy: Patient Spontanous Breathing  Post-op Pain: none  Post-op Assessment: Post-op Vital signs reviewed, Patient's Cardiovascular Status Stable, Respiratory Function Stable, Patent Airway and No signs of Nausea or vomiting  Post-op Vital Signs: Reviewed and stable  Complications: No apparent anesthesia complications

## 2012-10-13 NOTE — Interval H&P Note (Signed)
History and Physical Interval Note:  10/13/2012 9:54 AM  [name]  has presented today for surgery, with the diagnosis of a-fib  The various methods of treatment have been discussed with the patient and family. After consideration of risks, benefits and other options for treatment, the patient has consented to undergo Cardioversion.  The patient's history has been reviewed, patient examined, no change in status, stable for the procedure.  I have reviewed the patient's chart and labs.  Questions were answered to the patient's satisfaction.     Jacqulyn Ducking

## 2012-10-13 NOTE — Transfer of Care (Signed)
Immediate Anesthesia Transfer of Care Note  Patient: [name]  Procedure(s) Performed: Procedure(s): CARDIOVERSION (N/A)  Patient Location: PACU  Anesthesia Type:MAC  Level of Consciousness: awake, alert , oriented and patient cooperative  Airway & Oxygen Therapy: Patient Spontanous Breathing and Patient connected to face mask oxygen  Post-op Assessment: Report given to PACU RN, Post -op Vital signs reviewed and stable and Patient moving all extremities  Post vital signs: Reviewed and stable  Complications: No apparent anesthesia complications

## 2012-10-18 ENCOUNTER — Encounter (HOSPITAL_COMMUNITY): Payer: Self-pay | Admitting: Cardiology

## 2012-10-19 ENCOUNTER — Other Ambulatory Visit: Payer: Self-pay | Admitting: *Deleted

## 2012-10-19 ENCOUNTER — Encounter: Payer: Self-pay | Admitting: *Deleted

## 2012-10-19 DIAGNOSIS — I10 Essential (primary) hypertension: Secondary | ICD-10-CM

## 2012-10-26 ENCOUNTER — Ambulatory Visit (INDEPENDENT_AMBULATORY_CARE_PROVIDER_SITE_OTHER): Payer: Managed Care, Other (non HMO) | Admitting: Cardiology

## 2012-10-26 ENCOUNTER — Telehealth: Payer: Self-pay | Admitting: *Deleted

## 2012-10-26 ENCOUNTER — Encounter: Payer: Self-pay | Admitting: Cardiology

## 2012-10-26 ENCOUNTER — Encounter (HOSPITAL_BASED_OUTPATIENT_CLINIC_OR_DEPARTMENT_OTHER): Payer: Managed Care, Other (non HMO)

## 2012-10-26 VITALS — BP 94/67 | HR 62 | Ht 75.0 in | Wt 331.0 lb

## 2012-10-26 DIAGNOSIS — C9000 Multiple myeloma not having achieved remission: Secondary | ICD-10-CM

## 2012-10-26 DIAGNOSIS — K861 Other chronic pancreatitis: Secondary | ICD-10-CM

## 2012-10-26 DIAGNOSIS — E119 Type 2 diabetes mellitus without complications: Secondary | ICD-10-CM

## 2012-10-26 DIAGNOSIS — I10 Essential (primary) hypertension: Secondary | ICD-10-CM

## 2012-10-26 DIAGNOSIS — I4892 Unspecified atrial flutter: Secondary | ICD-10-CM

## 2012-10-26 DIAGNOSIS — E782 Mixed hyperlipidemia: Secondary | ICD-10-CM

## 2012-10-26 DIAGNOSIS — I2510 Atherosclerotic heart disease of native coronary artery without angina pectoris: Secondary | ICD-10-CM

## 2012-10-26 DIAGNOSIS — E785 Hyperlipidemia, unspecified: Secondary | ICD-10-CM

## 2012-10-26 DIAGNOSIS — I7090 Unspecified atherosclerosis: Secondary | ICD-10-CM

## 2012-10-26 LAB — BASIC METABOLIC PANEL
BUN: 28 mg/dL — ABNORMAL HIGH (ref 6–23)
CO2: 20 mEq/L (ref 19–32)
Calcium: 8.2 mg/dL — ABNORMAL LOW (ref 8.4–10.5)
Chloride: 110 mEq/L (ref 96–112)
Creatinine, Ser: 1.79 mg/dL — ABNORMAL HIGH (ref 0.50–1.35)
GFR calc Af Amer: 48 mL/min — ABNORMAL LOW (ref >90)
GFR calc non Af Amer: 42 mL/min — ABNORMAL LOW (ref >90)
Glucose, Bld: 84 mg/dL (ref 70–99)
Potassium: 3.2 mEq/L — ABNORMAL LOW (ref 3.5–5.1)
Sodium: 143 mEq/L (ref 135–145)

## 2012-10-26 MED ORDER — SODIUM CHLORIDE 0.9 % IJ SOLN
10.0000 mL | INTRAMUSCULAR | Status: DC | PRN
  Administered 2012-10-26: 10 mL via INTRAVENOUS
  Filled 2012-10-26: qty 10

## 2012-10-26 MED ORDER — HEPARIN SOD (PORK) LOCK FLUSH 100 UNIT/ML IV SOLN
INTRAVENOUS | Status: AC
  Filled 2012-10-26: qty 5

## 2012-10-26 MED ORDER — ZOLEDRONIC ACID 4 MG/5ML IV CONC
2.0000 mg | Freq: Once | INTRAVENOUS | Status: AC
  Administered 2012-10-26: 2 mg via INTRAVENOUS
  Filled 2012-10-26: qty 2.5

## 2012-10-26 MED ORDER — SODIUM CHLORIDE 0.9 % IV SOLN
INTRAVENOUS | Status: DC
  Administered 2012-10-26: 16:00:00 via INTRAVENOUS

## 2012-10-26 NOTE — Assessment & Plan Note (Addendum)
Patient continues to experience diarrhea, which has been attributed to pancreatic insufficiency. Appropriate therapy is being directed by GI.  Current medication list includes both Dexilant and Protonix; the latter drug will be discontinued.

## 2012-10-26 NOTE — Progress Notes (Deleted)
Name: [name]    DOB: Mar 27, 1961  Age: 52 y.o.  MR#: [id number]       PCP:  [name], MD      Insurance: Payor: [employer] / Plan: CIGNA MANAGED / Product Type: *No Product type* /   CC:    Chief Complaint  Patient presents with  . Coronary Artery Disease   NO LIST VS Filed Vitals:   10/26/12 1427  BP: 94/67  Pulse: 62  Height: 6\' 3"  (1.905 m)  Weight: 331 lb 0.6 oz (150.159 kg)    Weights Current Weight  10/26/12 331 lb 0.6 oz (150.159 kg)  10/13/12 332 lb (150.594 kg)  10/13/12 332 lb (150.594 kg)    Blood Pressure  BP Readings from Last 3 Encounters:  10/26/12 94/67  10/13/12 113/63  10/13/12 113/63     Admit date:  (Not on file) Last encounter with RMR:  10/05/2012   Allergy Review of patient's allergies indicates no known allergies.  Current Outpatient Prescriptions  Medication Sig Dispense Refill  . acyclovir (ZOVIRAX) 400 MG tablet Take 1 tablet (400 mg total) by mouth every morning.  30 tablet  3  . allopurinol (ZYLOPRIM) 300 MG tablet Take 1 tablet (300 mg total) by mouth daily.  30 tablet  3  . atorvastatin (LIPITOR) 80 MG tablet Take 80 mg by mouth at bedtime.      . Calcium Carbonate-Vit D-Min (CALCIUM 1200 PO) Take 1,200 mg by mouth daily.      . carvedilol (COREG) 3.125 MG tablet Take 1 tablet (3.125 mg total) by mouth 2 (two) times daily with a meal.  60 tablet  6  . Cholecalciferol (VITAMIN D) 2000 UNITS CAPS Take 1 capsule by mouth every morning.       Marland Kitchen dexamethasone (DECADRON) 4 MG tablet Take 5 tablets (20 mg total) by mouth 2 (two) times daily. Every Friday  40 tablet  2  . dexlansoprazole (DEXILANT) 60 MG capsule Take 60 mg by mouth every morning.       . diphenoxylate-atropine (LOMOTIL) 2.5-0.025 MG per tablet Take 2 tablets by mouth every morning. Max 8 per day  60 tablet  2  . HYDROcodone-acetaminophen (NORCO/VICODIN) 5-325 MG per tablet       . insulin aspart (NOVOLOG) 100 UNIT/ML injection Inject 5-20 Units into the skin 3 (three)  times daily before meals. Sliding scale      . insulin detemir (LEVEMIR) 100 UNIT/ML injection Inject 40 Units into the skin daily.      Marland Kitchen lenalidomide ([name]) 5 MG capsule Take 1 tablet PO 7 days on and 7 days off.  14 capsule  0  . lidocaine-prilocaine (EMLA) cream Apply 1 application topically as needed. To port site. Apply 1 hour prior to treatment. Do not rub in. Cover with plastic.      Marland Kitchen loperamide (IMODIUM) 2 MG capsule Take 1 capsule (2 mg total) by mouth 4 (four) times daily as needed for diarrhea or loose stools.  30 capsule  0  . loratadine (CLARITIN) 10 MG tablet Take 10 mg by mouth every morning.       . Multiple Vitamins-Minerals (MULTIVITAMINS THER. W/MINERALS) TABS Take 1 tablet by mouth every morning.       . nitroGLYCERIN (NITROSTAT) 0.4 MG SL tablet Place 1 tablet (0.4 mg total) under the tongue every 5 (five) minutes as needed for chest pain.  25 tablet  3  . ondansetron (ZOFRAN) 8 MG tablet Take 1 tablet (8 mg total) by  mouth every 4 (four) hours as needed for nausea.  15 tablet  0  . oxyCODONE (OXY IR/ROXICODONE) 5 MG immediate release tablet 1-2 tabs every 4 hours as needed for pain  30 tablet  0  . oxyCODONE-acetaminophen (PERCOCET) 5-325 MG per tablet Take 2 tablets by mouth every 4 (four) hours as needed for pain.  30 tablet  0  . oxyCODONE-acetaminophen (PERCOCET) 7.5-325 MG per tablet Take 1 tablet by mouth every 6 (six) hours as needed.  50 tablet  0  . Pancrelipase, Lip-Prot-Amyl, 36000 UNITS CPEP Take [id number] Units by mouth 3 (three) times daily. Take two with meals and one with snacks      . pantoprazole (PROTONIX) 20 MG tablet Take 2 tablets (40 mg total) by mouth daily.  30 tablet  0  . potassium chloride SA (K-DUR,KLOR-CON) 20 MEQ tablet Take 40 mEq by mouth 2 (two) times daily.      . Rivaroxaban (XARELTO) 20 MG TABS Take 20 mg by mouth at bedtime.      . sulfamethoxazole-trimethoprim (BACTRIM DS) 800-160 MG per tablet Take 1 tablet by mouth every Monday,  Wednesday, and Friday.      . torsemide (DEMADEX) 20 MG tablet Take 20 mg by mouth at bedtime.      . Zoledronic Acid (ZOMETA IV) Inject 2 mg into the vein every 28 (twenty-eight) days. To start Jul 11, 2011       No current facility-administered medications for this visit.    Discontinued Meds:   There are no discontinued medications.  Patient Active Problem List   Diagnosis Date Noted  . Anemia, normocytic normochromic 10/09/2012  . Chronic pancreatitis 10/07/2012  . Atrial flutter 09/17/2012  . DDD (degenerative disc disease), cervical 03/18/2012  . Arteriosclerotic cardiovascular disease (ASCVD)   . Hypertension   . Hyperlipidemia   . Multiple myeloma 07/01/2011  . Morbid obesity 04/29/2010  . DIABETES MELLITUS, TYPE II, ON INSULIN 11/15/2008  . SLEEP APNEA 11/15/2008    LABS    Component Value Date/Time   NA 140 10/05/2012 1411   NA 141 10/03/2012 1751   NA 141 09/20/2012 0518   K 5.0 10/05/2012 1411   K 4.1 10/03/2012 1751   K 3.7 09/20/2012 0518   CL 111 10/05/2012 1411   CL 108 10/03/2012 1751   CL 110 09/20/2012 0518   CO2 17* 10/05/2012 1411   CO2 23 10/03/2012 1751   CO2 24 09/20/2012 0518   GLUCOSE 203* 10/05/2012 1411   GLUCOSE 125* 10/03/2012 1751   GLUCOSE 59* 09/20/2012 0518   BUN 23 10/05/2012 1411   BUN 27* 10/03/2012 1751   BUN 18 09/20/2012 0518   CREATININE 2.24* 10/05/2012 1411   CREATININE 1.69* 10/03/2012 1751   CREATININE 1.47* 09/20/2012 0518   CREATININE 1.63* 09/19/2012 1226   CREATININE 1.82* 09/17/2012 1613   CREATININE 1.60* 10/20/2011 1758   CALCIUM 9.0 10/05/2012 1411   CALCIUM 8.4 10/03/2012 1751   CALCIUM 7.7* 09/20/2012 0518   GFRNONAA 45* 10/03/2012 1751   GFRNONAA 53* 09/20/2012 0518   GFRNONAA 47* 09/19/2012 1226   GFRAA 52* 10/03/2012 1751   GFRAA 61* 09/20/2012 0518   GFRAA 54* 09/19/2012 1226   CMP     Component Value Date/Time   NA 140 10/05/2012 1411   K 5.0 10/05/2012 1411   CL 111 10/05/2012 1411   CO2 17* 10/05/2012 1411   GLUCOSE 203* 10/05/2012 1411   BUN 23  10/05/2012 1411   CREATININE 2.24* 10/05/2012 1411  CREATININE 1.69* 10/03/2012 1751   CALCIUM 9.0 10/05/2012 1411   PROT 6.1 10/03/2012 1751   ALBUMIN 3.4* 10/03/2012 1751   AST 22 10/03/2012 1751   ALT 54* 10/03/2012 1751   ALKPHOS 71 10/03/2012 1751   BILITOT 0.3 10/03/2012 1751   GFRNONAA 45* 10/03/2012 1751   GFRAA 52* 10/03/2012 1751       Component Value Date/Time   WBC 11.8* 10/05/2012 1411   WBC 8.4 10/03/2012 1751   WBC 8.1 09/20/2012 0518   HGB 12.2* 10/05/2012 1411   HGB 11.8* 10/03/2012 1751   HGB 11.5* 09/20/2012 0518   HCT 36.2* 10/05/2012 1411   HCT 35.3* 10/03/2012 1751   HCT 34.2* 09/20/2012 0518   MCV 85.6 10/05/2012 1411   MCV 87.4 10/03/2012 1751   MCV 88.4 09/20/2012 0518    Lipid Panel     Component Value Date/Time   CHOL 158 07/18/2010 1724   TRIG 173 07/18/2010 1724   HDL 33 07/18/2010 1724   LDLCALC 90 07/18/2010 1724    ABG No results found for this basename: phart, pco2, pco2art, po2, po2art, hco3, tco2, acidbasedef, o2sat     Lab Results  Component Value Date   TSH 0.561 09/17/2012   BNP (last 3 results) No results found for this basename: PROBNP,  in the last 8760 hours Cardiac Panel (last 3 results) No results found for this basename: CKTOTAL, CKMB, TROPONINI, RELINDX,  in the last 72 hours  Iron/TIBC/Ferritin No results found for this basename: iron, tibc, ferritin     EKG Orders placed in visit on 10/26/12  . EKG 12-LEAD     Prior Assessment and Plan Problem List as of 10/26/2012   DIABETES MELLITUS, TYPE II, ON INSULIN   Last Assessment & Plan   07/28/2011 Office Visit Edited 08/03/2011  8:19 AM by Yehuda Savannah, MD     Diabetic control has been worse since treatment with glucocorticoids initiated.  I suggest allowing somewhat less than optimal control during treatment for multiple myeloma and subsequently aiming for a hemoglobin A1c level of 7.    Morbid obesity   Last Assessment & Plan   07/28/2011 Office Visit Written 07/28/2011  1:11 PM by Yehuda Savannah, MD      Patient is congratulated on a drop in weight loss and encouraged to continue to be as active as possible and to limit caloric intake.      SLEEP APNEA   Multiple myeloma   Last Assessment & Plan   10/05/2012 Office Visit Written 10/05/2012  2:29 PM by Yehuda Savannah, MD     Patient notes that he has been told he is doing well with respect to treatment for multiple myeloma. The medications he has received, including chronic steroids, render more difficult treatment of his other medical problems.    Arteriosclerotic cardiovascular disease (ASCVD)   Last Assessment & Plan   10/05/2012 Office Visit Written 10/05/2012  2:27 PM by Yehuda Savannah, MD     Patient is doing well symptomatically with respect to ischemic cardiomyopathy, but echocardiogram suggests a further decline in left ventricular systolic function. This may reflect his atrial arrhythmias and may improve after cardioversion. If not, consideration will need to be given to AICD implantation.    Hypertension   Last Assessment & Plan   10/05/2012 Office Visit Written 10/05/2012  2:28 PM by Yehuda Savannah, MD     Blood pressure control has been excellent. Current medication will be continued.    Hyperlipidemia  Last Assessment & Plan   07/28/2011 Office Visit Written 07/28/2011  1:10 PM by Yehuda Savannah, MD     Lipid profiles within the past year have been reasonable.  Current medication will be continued.    DDD (degenerative disc disease), cervical   Atrial flutter   Last Assessment & Plan   10/05/2012 Office Visit Written 10/05/2012  2:27 PM by Yehuda Savannah, MD     Recent onset of atrial arrhythmias in the absence of an obvious precipitant beyond his known cardiomyopathy. One attempt at DC cardioversion will be undertaken at patient's earliest convenience. If unsuccessful or if atrial fibrillation recurs rapidly, consideration will be given to long-term strategy of ventricular rate control and anticoagulation.    Chronic  pancreatitis   Anemia, normocytic normochromic       Imaging: No results found.

## 2012-10-26 NOTE — Progress Notes (Signed)
Patient ID: [name], male   DOB: 11-16-1960, 52 y.o.   MRN: [id number]  HPI: Schedule return visit for for this very nice gentleman with persistent atrial flutter. Patient has felt fine since undergoing cardioversion.  Recent endoscopic ultrasound suggested chronic pancreatitis and revealed retained food suggesting possible gastroparesis. Esophagus, duodenum, gallbladder and common bile duct were normal.  Patient continues to experience recurrent episodes of diarrhea as well as abdominal wall discomfort that he characterizes as soreness. He also has been experiencing vague left neck discomfort lasting a matter of seconds.  Current Outpatient Prescriptions  Medication Sig Dispense Refill  . acyclovir (ZOVIRAX) 400 MG tablet Take 1 tablet (400 mg total) by mouth every morning.  30 tablet  3  . allopurinol (ZYLOPRIM) 300 MG tablet Take 1 tablet (300 mg total) by mouth daily.  30 tablet  3  . atorvastatin (LIPITOR) 80 MG tablet Take 80 mg by mouth at bedtime.      . Calcium Carbonate-Vit D-Min (CALCIUM 1200 PO) Take 1,200 mg by mouth daily.      . carvedilol (COREG) 3.125 MG tablet Take 1 tablet (3.125 mg total) by mouth 2 (two) times daily with a meal.  60 tablet  6  . Cholecalciferol (VITAMIN D) 2000 UNITS CAPS Take 1 capsule by mouth every morning.       Marland Kitchen dexamethasone (DECADRON) 4 MG tablet Take 5 tablets (20 mg total) by mouth 2 (two) times daily. Every Friday  40 tablet  2  . dexlansoprazole (DEXILANT) 60 MG capsule Take 60 mg by mouth every morning.       . diphenoxylate-atropine (LOMOTIL) 2.5-0.025 MG per tablet Take 2 tablets by mouth every morning. Max 8 per day  60 tablet  2  . HYDROcodone-acetaminophen (NORCO/VICODIN) 5-325 MG per tablet       . insulin aspart (NOVOLOG) 100 UNIT/ML injection Inject 5-20 Units into the skin 3 (three) times daily before meals. Sliding scale      . insulin detemir (LEVEMIR) 100 UNIT/ML injection Inject 40 Units into the skin daily.      Marland Kitchen lenalidomide  ([name]) 5 MG capsule Take 1 tablet PO 7 days on and 7 days off.  14 capsule  0  . lidocaine-prilocaine (EMLA) cream Apply 1 application topically as needed. To port site. Apply 1 hour prior to treatment. Do not rub in. Cover with plastic.      Marland Kitchen loperamide (IMODIUM) 2 MG capsule Take 1 capsule (2 mg total) by mouth 4 (four) times daily as needed for diarrhea or loose stools.  30 capsule  0  . loratadine (CLARITIN) 10 MG tablet Take 10 mg by mouth every morning.       . Multiple Vitamins-Minerals (MULTIVITAMINS THER. W/MINERALS) TABS Take 1 tablet by mouth every morning.       . nitroGLYCERIN (NITROSTAT) 0.4 MG SL tablet Place 1 tablet (0.4 mg total) under the tongue every 5 (five) minutes as needed for chest pain.  25 tablet  3  . ondansetron (ZOFRAN) 8 MG tablet Take 1 tablet (8 mg total) by mouth every 4 (four) hours as needed for nausea.  15 tablet  0  . oxyCODONE (OXY IR/ROXICODONE) 5 MG immediate release tablet 1-2 tabs every 4 hours as needed for pain  30 tablet  0  . oxyCODONE-acetaminophen (PERCOCET) 5-325 MG per tablet Take 2 tablets by mouth every 4 (four) hours as needed for pain.  30 tablet  0  . oxyCODONE-acetaminophen (PERCOCET) 7.5-325 MG per tablet Take  1 tablet by mouth every 6 (six) hours as needed.  50 tablet  0  . Pancrelipase, Lip-Prot-Amyl, 36000 UNITS CPEP Take [id number] Units by mouth 3 (three) times daily. Take two with meals and one with snacks      . potassium chloride SA (K-DUR,KLOR-CON) 20 MEQ tablet Take 40 mEq by mouth 2 (two) times daily as needed. TAKES WHILE TAKING THE TORESEMIDE      . Rivaroxaban (XARELTO) 20 MG TABS Take 20 mg by mouth at bedtime.      . sulfamethoxazole-trimethoprim (BACTRIM DS) 800-160 MG per tablet Take 1 tablet by mouth every Monday, Wednesday, and Friday.      . torsemide (DEMADEX) 20 MG tablet Take 20 mg by mouth as needed (IN THE AM).       . Zoledronic Acid (ZOMETA IV) Inject 2 mg into the vein every 28 (twenty-eight) days. To start  Jul 11, 2011      No Known Allergies   Past medical history, social history, and family history reviewed and updated.  ROS: Denies chest pain, dyspnea, palpitations, lightheadedness, syncope, abdominal discomfort, symptoms of reflux. All other systems reviewed and are negative.  PHYSICAL EXAM: BP 94/67  Pulse 62  Ht 6\' 3"  (1.905 m)  Wt 150.159 kg (331 lb 0.6 oz)  BMI 41.38 kg/m2;  Body mass index is 41.38 kg/(m^2). General-Well developed; no acute distress Body habitus-moderate to markedly overweight  Neck-No JVD; no carotid bruits Lungs-clear lung fields; resonant to percussion Cardiovascular-normal PMI; normal S1 and S2 Abdomen-normal bowel sounds; soft and non-tender without masses or organomegaly Musculoskeletal-No deformities, no cyanosis or clubbing Neurologic-Normal cranial nerves; symmetric strength and tone Skin-Warm, no significant lesions Extremities-distal pulses intact; no edema  EKG: Atrial flutter with variable AV block and a ventricular rate of 62 bpm; cannot exclude previous inferior MI; prior anterior MI nonspecific T wave abnormality;  Jacqulyn Ducking, MD 10/26/2012  2:35 PM  ASSESSMENT AND PLAN

## 2012-10-26 NOTE — Assessment & Plan Note (Signed)
Atypical atrial flutter has recurred soon after successful cardioversion, but is causing no apparent symptoms. We will pursue a strategy of rate control plus anticoagulation.

## 2012-10-26 NOTE — Assessment & Plan Note (Signed)
Patient describes no symptoms suggesting recurrent myocardial ischemia; no signs nor symptoms of decompensated congestive heart failure. Current medical therapy appears effective.

## 2012-10-26 NOTE — Progress Notes (Signed)
Tolerated zometa 2 mg IV well.

## 2012-10-26 NOTE — Telephone Encounter (Signed)
Called [employer] for pre authorization for Xarelto 20 mg.  Was advised that decision will be made in 5-7 days and faxed back to the [place] office. Reference # is the following: [id number].

## 2012-10-26 NOTE — Patient Instructions (Addendum)
Your physician recommends that you schedule a follow-up appointment in: [place] physician recommends that you return for lab work in: [place] The patient's paper medical record is not available during this visit. It has been removed from this office and cannot be located. (BMET,PT,PTT,FLP) SLIPS TO BE MAILED   Your physician has recommended you make the following change in your medication:   1) [place]

## 2012-10-27 NOTE — Assessment & Plan Note (Signed)
Lipid profile was acceptable in 2012; repeat testing will be performed with next blood draw.

## 2012-10-27 NOTE — Assessment & Plan Note (Signed)
Blood pressure control has been excellent in recent months; current medication will be continued.

## 2012-11-01 ENCOUNTER — Telehealth (HOSPITAL_COMMUNITY): Payer: Self-pay | Admitting: Oncology

## 2012-11-01 ENCOUNTER — Other Ambulatory Visit (HOSPITAL_COMMUNITY): Payer: Self-pay | Admitting: Oncology

## 2012-11-01 ENCOUNTER — Telehealth (INDEPENDENT_AMBULATORY_CARE_PROVIDER_SITE_OTHER): Payer: Self-pay | Admitting: *Deleted

## 2012-11-01 DIAGNOSIS — C9000 Multiple myeloma not having achieved remission: Secondary | ICD-10-CM

## 2012-11-01 MED ORDER — LENALIDOMIDE 5 MG PO CAPS: ORAL_CAPSULE | ORAL | Status: DC

## 2012-11-01 NOTE — Telephone Encounter (Signed)
Received incoming fax to deny pt xarelto per insurance prior authorization, will look into pt assistance program to see if pt will qualify, will continue to give pt samples until answer received

## 2012-11-01 NOTE — Telephone Encounter (Signed)
Needs samples of Creon. He is running short. [facility] return phone number is [phone].

## 2012-11-02 NOTE — Telephone Encounter (Signed)
LM to see if [facility] has any samples of Creon. The return phone number is [phone].

## 2012-11-03 NOTE — Telephone Encounter (Signed)
Patient called and told that there was a few samples of the Zenpep up front. He has talked with his insurance company as it has changed. He is awaiting info on the creon, he may be able to get this for free.  He will keep [place] informed

## 2012-11-11 ENCOUNTER — Ambulatory Visit (HOSPITAL_COMMUNITY): Payer: Managed Care, Other (non HMO) | Admitting: Oncology

## 2012-11-11 ENCOUNTER — Telehealth (HOSPITAL_COMMUNITY): Payer: Self-pay | Admitting: *Deleted

## 2012-11-11 NOTE — Telephone Encounter (Signed)
Patient called in to report testicular soreness again as he has had before. Would like to be seen today. He is going to try to get into see his PCP but if unable will come this evening at 330

## 2012-11-12 ENCOUNTER — Encounter: Payer: Self-pay | Admitting: *Deleted

## 2012-11-17 ENCOUNTER — Other Ambulatory Visit (HOSPITAL_COMMUNITY): Payer: Managed Care, Other (non HMO)

## 2012-11-17 NOTE — Telephone Encounter (Signed)
.  left message to have patient return my call. To advise the denial of approval for xarelto, filled out pt assistance form, placed at front desk for pt pick up to complete the remaining portion and return to our office once completed in order to fax in for assistance approval, will also clarify if pt needs samples at this time

## 2012-11-23 ENCOUNTER — Ambulatory Visit (HOSPITAL_COMMUNITY): Payer: Managed Care, Other (non HMO)

## 2012-11-25 ENCOUNTER — Other Ambulatory Visit (HOSPITAL_COMMUNITY): Payer: Self-pay | Admitting: Urology

## 2012-11-25 DIAGNOSIS — N451 Epididymitis: Secondary | ICD-10-CM

## 2012-11-26 ENCOUNTER — Encounter (HOSPITAL_COMMUNITY): Payer: Managed Care, Other (non HMO) | Attending: Oncology

## 2012-11-26 ENCOUNTER — Ambulatory Visit (HOSPITAL_COMMUNITY)
Admission: RE | Admit: 2012-11-26 | Discharge: 2012-11-26 | Disposition: A | Discharge status: Home / Self Care | Payer: Managed Care, Other (non HMO) | Source: Ambulatory Visit | Attending: Oncology | Admitting: Oncology

## 2012-11-26 ENCOUNTER — Other Ambulatory Visit (HOSPITAL_COMMUNITY): Payer: Self-pay | Admitting: Oncology

## 2012-11-26 DIAGNOSIS — C9000 Multiple myeloma not having achieved remission: Secondary | ICD-10-CM | POA: Insufficient documentation

## 2012-11-26 DIAGNOSIS — Y849 Medical procedure, unspecified as the cause of abnormal reaction of the patient, or of later complication, without mention of misadventure at the time of the procedure: Secondary | ICD-10-CM | POA: Insufficient documentation

## 2012-11-26 DIAGNOSIS — T82898A Other specified complication of vascular prosthetic devices, implants and grafts, initial encounter: Secondary | ICD-10-CM | POA: Insufficient documentation

## 2012-11-26 DIAGNOSIS — X58XXXA Exposure to other specified factors, initial encounter: Secondary | ICD-10-CM | POA: Insufficient documentation

## 2012-11-26 LAB — CBC WITH DIFFERENTIAL/PLATELET
Basophils Absolute: 0 10*3/uL (ref 0.0–0.1)
Basophils Relative: 0 % (ref 0–1)
Eosinophils Absolute: 0 10*3/uL (ref 0.0–0.7)
Eosinophils Relative: 0 % (ref 0–5)
HCT: 32.6 % — ABNORMAL LOW (ref 39.0–52.0)
Hemoglobin: 10.7 g/dL — ABNORMAL LOW (ref 13.0–17.0)
Lymphocytes Relative: 10 % — ABNORMAL LOW (ref 12–46)
Lymphs Abs: 0.7 10*3/uL (ref 0.7–4.0)
MCH: 30.1 pg (ref 26.0–34.0)
MCHC: 32.8 g/dL (ref 30.0–36.0)
MCV: 91.8 fL (ref 78.0–100.0)
Monocytes Absolute: 0.1 10*3/uL (ref 0.1–1.0)
Monocytes Relative: 2 % — ABNORMAL LOW (ref 3–12)
Neutro Abs: 5.6 10*3/uL (ref 1.7–7.7)
Neutrophils Relative %: 88 % — ABNORMAL HIGH (ref 43–77)
Platelets: 176 10*3/uL (ref 150–400)
RBC: 3.55 MIL/uL — ABNORMAL LOW (ref 4.22–5.81)
RDW: 16.9 % — ABNORMAL HIGH (ref 11.5–15.5)
WBC: 6.4 10*3/uL (ref 4.0–10.5)

## 2012-11-26 LAB — COMPREHENSIVE METABOLIC PANEL
ALT: 116 U/L — ABNORMAL HIGH (ref 0–53)
AST: 88 U/L — ABNORMAL HIGH (ref 0–37)
Albumin: 3.2 g/dL — ABNORMAL LOW (ref 3.5–5.2)
Alkaline Phosphatase: 92 U/L (ref 39–117)
BUN: 24 mg/dL — ABNORMAL HIGH (ref 6–23)
CO2: 17 mEq/L — ABNORMAL LOW (ref 19–32)
Calcium: 8.9 mg/dL (ref 8.4–10.5)
Chloride: 106 mEq/L (ref 96–112)
Creatinine, Ser: 1.77 mg/dL — ABNORMAL HIGH (ref 0.50–1.35)
GFR calc Af Amer: 49 mL/min — ABNORMAL LOW (ref >90)
GFR calc non Af Amer: 42 mL/min — ABNORMAL LOW (ref >90)
Glucose, Bld: 257 mg/dL — ABNORMAL HIGH (ref 70–99)
Potassium: 4 mEq/L (ref 3.5–5.1)
Sodium: 138 mEq/L (ref 135–145)
Total Bilirubin: 0.7 mg/dL (ref 0.3–1.2)
Total Protein: 5.9 g/dL — ABNORMAL LOW (ref 6.0–8.3)

## 2012-11-26 MED ORDER — SODIUM CHLORIDE 0.9 % IV SOLN
INTRAVENOUS | Status: DC
  Administered 2012-11-26: 14:00:00 via INTRAVENOUS

## 2012-11-26 MED ORDER — SODIUM CHLORIDE 0.9 % IJ SOLN
10.0000 mL | INTRAMUSCULAR | Status: DC | PRN
  Filled 2012-11-26: qty 10

## 2012-11-26 MED ORDER — HEPARIN SOD (PORK) LOCK FLUSH 100 UNIT/ML IV SOLN
500.0000 [IU] | Freq: Once | INTRAVENOUS | Status: DC
  Filled 2012-11-26: qty 5

## 2012-11-26 MED ORDER — IOHEXOL 300 MG/ML  SOLN
50.0000 mL | Freq: Once | INTRAMUSCULAR | Status: AC | PRN
  Administered 2012-11-26: 20 mL via INTRAVENOUS

## 2012-11-26 MED ORDER — ZOLEDRONIC ACID 4 MG/5ML IV CONC
2.0000 mg | Freq: Once | INTRAVENOUS | Status: AC
  Administered 2012-11-26: 2 mg via INTRAVENOUS
  Filled 2012-11-26: qty 2.5

## 2012-11-26 NOTE — Progress Notes (Signed)
Accessed port, unable to flush saline. Repositioned needle, still unable to flush with saline. Needle removed. Re-accessed port. Able to flush with saline. Pt reports he can "feel something running through a tube". Denies any pain or discomfort. Unable to withdraw blood after several attempts and repositioning.Port and needle inspected by [employer]. Dye study ordered. Peripheral IV started for blood draw and zometa infusion. Pt returned to clinic from dye study. [name] spoke with pt after talking to radiologist Dr.M.Boles. Pt may have a tear in port catheter. Pt will be referred back to Dr.Bradford for port removal/replacement. Pt verbalized understanding. Port needle removed.

## 2012-11-29 ENCOUNTER — Other Ambulatory Visit (HOSPITAL_COMMUNITY): Payer: Self-pay | Admitting: Oncology

## 2012-11-29 DIAGNOSIS — C9000 Multiple myeloma not having achieved remission: Secondary | ICD-10-CM

## 2012-11-29 LAB — KAPPA/LAMBDA LIGHT CHAINS
Kappa free light chain: 2.12 mg/dL — ABNORMAL HIGH (ref 0.33–1.94)
Kappa, lambda light chain ratio: 1.2 (ref 0.26–1.65)
Lambda free light chains: 1.77 mg/dL (ref 0.57–2.63)

## 2012-11-29 MED ORDER — LENALIDOMIDE 5 MG PO CAPS: ORAL_CAPSULE | ORAL | Status: DC

## 2012-11-30 LAB — MULTIPLE MYELOMA PANEL, SERUM
Albumin ELP: 60 % (ref 55.8–66.1)
Alpha-1-Globulin: 6.5 % — ABNORMAL HIGH (ref 2.9–4.9)
Alpha-2-Globulin: 12.1 % — ABNORMAL HIGH (ref 7.1–11.8)
Beta 2: 4.9 % (ref 3.2–6.5)
Beta Globulin: 7.4 % — ABNORMAL HIGH (ref 4.7–7.2)
Gamma Globulin: 9.1 % — ABNORMAL LOW (ref 11.1–18.8)
IgA: 77 mg/dL (ref 68–379)
IgG (Immunoglobin G), Serum: 484 mg/dL — ABNORMAL LOW (ref 650–1600)
IgM, Serum: 70 mg/dL (ref 41–251)
M-Spike, %: NOT DETECTED g/dL
Total Protein: 5.1 g/dL — ABNORMAL LOW (ref 6.0–8.3)

## 2012-12-01 ENCOUNTER — Ambulatory Visit (HOSPITAL_COMMUNITY)
Admission: RE | Admit: 2012-12-01 | Discharge: 2012-12-01 | Disposition: A | Discharge status: Home / Self Care | Payer: Managed Care, Other (non HMO) | Source: Ambulatory Visit | Attending: Urology | Admitting: Urology

## 2012-12-01 DIAGNOSIS — N451 Epididymitis: Secondary | ICD-10-CM

## 2012-12-01 DIAGNOSIS — K5730 Diverticulosis of large intestine without perforation or abscess without bleeding: Secondary | ICD-10-CM | POA: Insufficient documentation

## 2012-12-01 DIAGNOSIS — K449 Diaphragmatic hernia without obstruction or gangrene: Secondary | ICD-10-CM | POA: Insufficient documentation

## 2012-12-01 DIAGNOSIS — R319 Hematuria, unspecified: Secondary | ICD-10-CM | POA: Insufficient documentation

## 2012-12-01 DIAGNOSIS — K7689 Other specified diseases of liver: Secondary | ICD-10-CM | POA: Insufficient documentation

## 2012-12-01 MED ORDER — IOHEXOL 300 MG/ML  SOLN
125.0000 mL | Freq: Once | INTRAMUSCULAR | Status: AC | PRN
  Administered 2012-12-01: 125 mL via INTRAVENOUS

## 2012-12-01 NOTE — Patient Instructions (Addendum)
    [name]  12/01/2012   Your procedure is scheduled on:  12/07/2012  Report to [facility] at  700  AM.  Call this number if you have problems the morning of surgery: [phone]   Remember:   Do not eat food or drink liquids after midnight.   Take these medicines the morning of surgery with A SIP OF WATER: norco, allopurinol, coreg,decadron, dexilant, zofran, percocet   Do not wear jewelry, make-up or nail polish.  Do not wear lotions, powders, or perfumes.   Do not shave 48 hours prior to surgery. Men may shave face and neck.  Do not bring valuables to the hospital.  [facility] is not responsible for any belongings or valuables.  Contacts, dentures or bridgework may not be worn into surgery.  Leave suitcase in the car. After surgery it may be brought to your room.  For patients admitted to the hospital, checkout time is 11:00 AM the day of discharge.   Patients discharged the day of surgery will not be allowed to drive  home.  Name and phone number of your driver: family  Special Instructions: Shower using CHG 2 nights before surgery and the night before surgery.  If you shower the day of surgery use CHG.  Use special wash - you have one bottle of CHG for all showers.  You should use approximately 1/3 of the bottle for each shower.   Please read over the following fact sheets that you were given: Pain Booklet, Coughing and Deep Breathing, MRSA Information, Surgical Site Infection Prevention, Anesthesia Post-op Instructions and Care and Recovery After Surgery PATIENT INSTRUCTIONS POST-ANESTHESIA  IMMEDIATELY FOLLOWING SURGERY:  Do not drive or operate machinery for the first twenty four hours after surgery.  Do not make any important decisions for twenty four hours after surgery or while taking narcotic pain medications or sedatives.  If you develop intractable nausea and vomiting or a severe headache please notify your doctor immediately.  FOLLOW-UP:  Please make an appointment  with your surgeon as instructed. You do not need to follow up with anesthesia unless specifically instructed to do so.  WOUND CARE INSTRUCTIONS (if applicable):  Keep a dry clean dressing on the anesthesia/puncture wound site if there is drainage.  Once the wound has quit draining you may leave it open to air.  Generally you should leave the bandage intact for twenty four hours unless there is drainage.  If the epidural site drains for more than 36-48 hours please call the anesthesia department.  QUESTIONS?:  Please feel free to call your physician or the hospital operator if you have any questions, and they will be happy to assist you.

## 2012-12-02 ENCOUNTER — Encounter (HOSPITAL_COMMUNITY)
Admission: RE | Admit: 2012-12-02 | Discharge: 2012-12-02 | Disposition: A | Discharge status: Home / Self Care | Payer: Managed Care, Other (non HMO) | Source: Ambulatory Visit | Attending: Urology | Admitting: Urology

## 2012-12-02 ENCOUNTER — Encounter (HOSPITAL_COMMUNITY): Payer: Self-pay

## 2012-12-02 HISTORY — DX: Gout, unspecified: M10.9

## 2012-12-02 HISTORY — DX: Acute myocardial infarction, unspecified: I21.9

## 2012-12-02 NOTE — Telephone Encounter (Signed)
.  left message to have patient return my call. Also to see if pt needs more samples

## 2012-12-02 NOTE — Consult Note (Signed)
NAMEDALAS, HARTSHORNE NO.:  [id number]  MEDICAL RECORD NO.:  [id number]  LOCATION:  [facility]                         FACILITY:  [facility]  PHYSICIAN:  Felicie Morn, M.D. DATE OF BIRTH:  02-07-1961  DATE OF CONSULTATION: DATE OF DISCHARGE:                                CONSULTATION   DIAGNOSIS:  Multiple myeloma.  This is a 52 year old African American who has a diagnosis of multiple myeloma, who is receiving continued therapy for this.  He had a Port-A- Cath placed in 2013 and this has been functioning well until recently and has had some problems and there appears to be either thrombus or leak in this area, and we will have to replace this either over a guide wire, or placed entirely new Port-A-Cath on the opposite side.  This was left Port-A-Cath and we will place this possibly on the right side if we cannot change this over guidewire depending upon what we find.  We discussed the need for this and the complications not limited to, but including bleeding, infection, thrombosis, pneumothorax, and catheter embolization.  Informed consent was obtained.  Other medical problems include coronary artery disease.  The patient has had heart attack in 2003 or 4, and he had some stents placed at that time.  He has a diagnosis multiple myeloma.  He is also morbidly obese. Interestingly, his mother had diagnosis of multiple myeloma as well.  He also has a history of hypertension.  PAST SURGERIES:  In 2005 a lap band surgery for his morbid obesity.  His stent is placed for his coronary artery disease in 2003 or 2004, left wrist surgery in the 1980s and stated an insertion of a Port-A-Cath in 2013.  He has no known allergies.  For his medications, please see his medical list.  PHYSICAL EXAMINATION:  VITAL SIGNS:  He is 6 feet 3-1/2 inches tall, weighs 335 pounds.  His temperature is 98.3, his pulse was 60, respirations 14, his blood pressure is 100/58. HEENT:  Head is  normocephalic.  Eyes, extraocular movements are intact. Pupils are round and reactive to light and accommodation.  There is noted some bilateral pallor in the conjunctiva.  No bruits and no adenopathy is appreciated.  No thyromegaly. CHEST:  Clear.  The patient has a noninfected Port-A-Cath site on the left side. HEART:  Regular rhythm. ABDOMEN:  Soft.  The patient has scars from previous surgery for his lap band.  There are no obvious incisional hernias.  The patient has quite a bit panniculus. RECTAL:  Deferred. EXTREMITIES:  Without any present edema.  REVIEW OF SYSTEMS:  No history of migraines or seizures.  The patient has have a history of diabetes type 2.  Thyroid:  No history of thyroid disease.  Cardiopulmonary disease:  History of hypertension, history of heart attack and stent placements as mentioned above.  Musculoskeletal System:  Morbid obesity, and status post left wrist surgery.  GI System: No history of hepatitis, constipation.  He has had some loose stools. No bright red rectal bleeding, melena, history of inflammatory bowel disease or irritable bowel syndrome.  He has no unexplained weight loss. His last colonoscopy was in 2011.  GU System:  No  history of frequency or dysuria or kidney stones.  He has had some recent bouts of epididymitis for which he is currently being treated.  His genitals are not swollen or tender at present.  Review of history and physical, therefore, Mr. Lumbard is a 52 year old black male who has the diagnosis of multiple myeloma.  We will try to sort out his problems with his Port-A-Cath.  This is not exactly easy as he is well aware due to his body habitus and body type but we will try to take care of him and provided with new venous access.  This was discussed in detail with him and his wife and informed consent was obtained and the risks were explained.     Felicie Morn, M.D.     WB/MEDQ  D:  12/01/2012  T:  12/02/2012  Job:   [id number]  cc:   [name]. [name], MD Fax: [phone]

## 2012-12-07 ENCOUNTER — Encounter (HOSPITAL_COMMUNITY): Payer: Self-pay | Admitting: Anesthesiology

## 2012-12-07 ENCOUNTER — Ambulatory Visit (HOSPITAL_COMMUNITY): Payer: Managed Care, Other (non HMO) | Admitting: Anesthesiology

## 2012-12-07 ENCOUNTER — Encounter (HOSPITAL_COMMUNITY): Payer: Self-pay | Admitting: *Deleted

## 2012-12-07 ENCOUNTER — Ambulatory Visit (HOSPITAL_COMMUNITY): Payer: Managed Care, Other (non HMO)

## 2012-12-07 ENCOUNTER — Other Ambulatory Visit (HOSPITAL_COMMUNITY): Payer: Self-pay | Admitting: Oncology

## 2012-12-07 ENCOUNTER — Encounter (HOSPITAL_COMMUNITY)
Admission: RE | Disposition: A | Discharge status: Home / Self Care | Payer: Self-pay | Source: Ambulatory Visit | Attending: General Surgery

## 2012-12-07 ENCOUNTER — Ambulatory Visit (HOSPITAL_COMMUNITY)
Admission: RE | Admit: 2012-12-07 | Discharge: 2012-12-07 | Disposition: A | Discharge status: Home / Self Care | Payer: Managed Care, Other (non HMO) | Source: Ambulatory Visit | Attending: General Surgery | Admitting: General Surgery

## 2012-12-07 DIAGNOSIS — T82598A Other mechanical complication of other cardiac and vascular devices and implants, initial encounter: Secondary | ICD-10-CM | POA: Insufficient documentation

## 2012-12-07 DIAGNOSIS — Y838 Other surgical procedures as the cause of abnormal reaction of the patient, or of later complication, without mention of misadventure at the time of the procedure: Secondary | ICD-10-CM | POA: Insufficient documentation

## 2012-12-07 DIAGNOSIS — E119 Type 2 diabetes mellitus without complications: Secondary | ICD-10-CM | POA: Insufficient documentation

## 2012-12-07 DIAGNOSIS — C9000 Multiple myeloma not having achieved remission: Secondary | ICD-10-CM | POA: Insufficient documentation

## 2012-12-07 DIAGNOSIS — Z452 Encounter for adjustment and management of vascular access device: Secondary | ICD-10-CM

## 2012-12-07 DIAGNOSIS — I10 Essential (primary) hypertension: Secondary | ICD-10-CM | POA: Insufficient documentation

## 2012-12-07 DIAGNOSIS — Z01812 Encounter for preprocedural laboratory examination: Secondary | ICD-10-CM | POA: Insufficient documentation

## 2012-12-07 HISTORY — PX: PORT-A-CATH REMOVAL: SHX5289

## 2012-12-07 HISTORY — PX: PORTACATH PLACEMENT: SHX2246

## 2012-12-07 LAB — GLUCOSE, CAPILLARY
Glucose-Capillary: 124 mg/dL — ABNORMAL HIGH (ref 70–99)
Glucose-Capillary: 124 mg/dL — ABNORMAL HIGH (ref 70–99)

## 2012-12-07 SURGERY — REMOVAL PORT-A-CATH
Anesthesia: Monitor Anesthesia Care | Site: Chest | Wound class: Clean

## 2012-12-07 MED ORDER — FENTANYL CITRATE 0.05 MG/ML IJ SOLN
INTRAMUSCULAR | Status: AC
  Filled 2012-12-07: qty 2

## 2012-12-07 MED ORDER — PROPOFOL INFUSION 10 MG/ML OPTIME
INTRAVENOUS | Status: DC | PRN
  Administered 2012-12-07: 35 ug/kg/min via INTRAVENOUS
  Administered 2012-12-07: 12:00:00 via INTRAVENOUS

## 2012-12-07 MED ORDER — CEFAZOLIN SODIUM-DEXTROSE 2-3 GM-% IV SOLR
INTRAVENOUS | Status: AC
  Filled 2012-12-07: qty 50

## 2012-12-07 MED ORDER — ONDANSETRON HCL 4 MG/2ML IJ SOLN: 4.0000 mg | Freq: Once | INTRAMUSCULAR | Status: DC | PRN

## 2012-12-07 MED ORDER — SODIUM CHLORIDE 0.9 % IR SOLN
Status: DC | PRN
  Administered 2012-12-07: 1000 mL

## 2012-12-07 MED ORDER — LACTATED RINGERS IV SOLN
INTRAVENOUS | Status: DC
  Administered 2012-12-07: 10:00:00 via INTRAVENOUS

## 2012-12-07 MED ORDER — MIDAZOLAM HCL 2 MG/2ML IJ SOLN
INTRAMUSCULAR | Status: AC
  Filled 2012-12-07: qty 2

## 2012-12-07 MED ORDER — FENTANYL CITRATE 0.05 MG/ML IJ SOLN: 25.0000 ug | INTRAMUSCULAR | Status: DC | PRN

## 2012-12-07 MED ORDER — PROPOFOL 10 MG/ML IV EMUL
INTRAVENOUS | Status: AC
  Filled 2012-12-07: qty 20

## 2012-12-07 MED ORDER — HEPARIN SOD (PORK) LOCK FLUSH 100 UNIT/ML IV SOLN
INTRAVENOUS | Status: AC
  Filled 2012-12-07: qty 5

## 2012-12-07 MED ORDER — BACITRACIN-NEOMYCIN-POLYMYXIN 400-5-5000 EX OINT
TOPICAL_OINTMENT | CUTANEOUS | Status: AC
  Filled 2012-12-07: qty 1

## 2012-12-07 MED ORDER — MIDAZOLAM HCL 5 MG/5ML IJ SOLN
INTRAMUSCULAR | Status: DC | PRN
  Administered 2012-12-07 (×2): 1 mg via INTRAVENOUS

## 2012-12-07 MED ORDER — FENTANYL CITRATE 0.05 MG/ML IJ SOLN
INTRAMUSCULAR | Status: DC | PRN
  Administered 2012-12-07 (×2): 25 ug via INTRAVENOUS

## 2012-12-07 MED ORDER — CEFAZOLIN SODIUM-DEXTROSE 2-3 GM-% IV SOLR
2.0000 g | Freq: Once | INTRAVENOUS | Status: AC
  Administered 2012-12-07: 2 g via INTRAVENOUS

## 2012-12-07 MED ORDER — FENTANYL CITRATE 0.05 MG/ML IJ SOLN
25.0000 ug | INTRAMUSCULAR | Status: DC | PRN
  Administered 2012-12-07: 25 ug via INTRAVENOUS

## 2012-12-07 MED ORDER — HEPARIN SODIUM (PORCINE) 1000 UNIT/ML IJ SOLN
INTRAMUSCULAR | Status: AC
  Filled 2012-12-07: qty 1

## 2012-12-07 MED ORDER — WATER FOR IRRIGATION, STERILE IR SOLN
Status: DC | PRN
  Administered 2012-12-07: 1000 mL

## 2012-12-07 MED ORDER — LIDOCAINE HCL (PF) 1 % IJ SOLN
INTRAMUSCULAR | Status: DC | PRN
  Administered 2012-12-07 (×2): 10 mL

## 2012-12-07 MED ORDER — BACITRACIN-NEOMYCIN-POLYMYXIN 400-5-5000 EX OINT
TOPICAL_OINTMENT | CUTANEOUS | Status: AC
  Filled 2012-12-07: qty 2

## 2012-12-07 MED ORDER — BACITRACIN ZINC 500 UNIT/GM EX OINT
TOPICAL_OINTMENT | CUTANEOUS | Status: AC
  Filled 2012-12-07: qty 0.9

## 2012-12-07 MED ORDER — LIDOCAINE HCL (PF) 1 % IJ SOLN
INTRAMUSCULAR | Status: AC
  Filled 2012-12-07: qty 30

## 2012-12-07 MED ORDER — BACITRACIN-NEOMYCIN-POLYMYXIN 400-5-5000 EX OINT
TOPICAL_OINTMENT | CUTANEOUS | Status: DC | PRN
  Administered 2012-12-07 (×2): 1 via TOPICAL

## 2012-12-07 MED ORDER — MIDAZOLAM HCL 2 MG/2ML IJ SOLN
1.0000 mg | INTRAMUSCULAR | Status: DC | PRN
  Administered 2012-12-07: 2 mg via INTRAVENOUS

## 2012-12-07 SURGICAL SUPPLY — 61 items
APPLIER CLIP 9.375 SM OPEN (CLIP)
APR CLP SM 9.3 20 MLT OPN (CLIP)
BAG DECANTER FOR FLEXI CONT (MISCELLANEOUS) ×3 IMPLANT
BAG HAMPER (MISCELLANEOUS) ×3 IMPLANT
BLADE SURG 15 STRL LF DISP TIS (BLADE) ×4 IMPLANT
BLADE SURG 15 STRL SS (BLADE) ×6
CLIP APPLIE 9.375 SM OPEN (CLIP) IMPLANT
CLOTH BEACON ORANGE TIMEOUT ST (SAFETY) ×3 IMPLANT
COVER LIGHT HANDLE STERIS (MISCELLANEOUS) ×6 IMPLANT
DECANTER SPIKE VIAL GLASS SM (MISCELLANEOUS) ×3 IMPLANT
DRAPE C-ARM FOLDED MOBILE STRL (DRAPES) ×3 IMPLANT
DRAPE LAPAROTOMY TRNSV 102X78 (DRAPE) ×3 IMPLANT
DRSG TEGADERM 2-3/8X2-3/4 SM (GAUZE/BANDAGES/DRESSINGS) ×6 IMPLANT
DURAPREP 26ML APPLICATOR (WOUND CARE) ×1 IMPLANT
ELECT REM PT RETURN 9FT ADLT (ELECTROSURGICAL) ×3
ELECTRODE REM PT RTRN 9FT ADLT (ELECTROSURGICAL) ×2 IMPLANT
GAUZE SPONGE 4X4 16PLY XRAY LF (GAUZE/BANDAGES/DRESSINGS) ×6 IMPLANT
GLOVE BIOGEL PI IND STRL 7.0 (GLOVE) ×2 IMPLANT
GLOVE BIOGEL PI IND STRL 7.5 (GLOVE) ×1 IMPLANT
GLOVE BIOGEL PI INDICATOR 7.0 (GLOVE) ×2
GLOVE BIOGEL PI INDICATOR 7.5 (GLOVE) ×1
GLOVE ECLIPSE 7.0 STRL STRAW (GLOVE) ×6 IMPLANT
GLOVE SKINSENSE NS SZ7.0 (GLOVE) ×1
GLOVE SKINSENSE STRL SZ7.0 (GLOVE) ×2 IMPLANT
GOWN STRL REIN XL XLG (GOWN DISPOSABLE) ×9 IMPLANT
IV NS 500ML (IV SOLUTION) ×3
IV NS 500ML BAXH (IV SOLUTION) ×2 IMPLANT
KIT BLADEGUARD II DBL (SET/KITS/TRAYS/PACK) ×3 IMPLANT
KIT PORT POWER 8FR ISP MRI (CATHETERS) IMPLANT
KIT ROOM TURNOVER APOR (KITS) ×3 IMPLANT
MANIFOLD NEPTUNE II (INSTRUMENTS) ×3 IMPLANT
MARKER SKIN DUAL TIP RULER LAB (MISCELLANEOUS) ×3 IMPLANT
NDL HYPO 18GX1.5 BLUNT FILL (NEEDLE) ×1 IMPLANT
NEEDLE HYPO 18GX1.5 BLUNT FILL (NEEDLE) ×3 IMPLANT
NEEDLE HYPO 25X1 1.5 SAFETY (NEEDLE) ×3 IMPLANT
NS IRRIG 1000ML POUR BTL (IV SOLUTION) ×3 IMPLANT
PACK BASIC III (CUSTOM PROCEDURE TRAY) ×3
PACK MINOR (CUSTOM PROCEDURE TRAY) ×3 IMPLANT
PACK SRG BSC III STRL LF ECLPS (CUSTOM PROCEDURE TRAY) ×2 IMPLANT
PAD ARMBOARD 7.5X6 YLW CONV (MISCELLANEOUS) ×3 IMPLANT
SET BASIN LINEN APH (SET/KITS/TRAYS/PACK) ×3 IMPLANT
SHEATH COOK PEEL AWAY SET 8F (SHEATH) ×4 IMPLANT
SOL PREP PROV IODINE SCRUB 4OZ (MISCELLANEOUS) ×3 IMPLANT
SPONGE GAUZE 2X2 8PLY STRL LF (GAUZE/BANDAGES/DRESSINGS) ×6 IMPLANT
STRIP CLOSURE SKIN 1/4X3 (GAUZE/BANDAGES/DRESSINGS) ×3 IMPLANT
SUT VIC AB 3-0 SH 27 (SUTURE)
SUT VIC AB 3-0 SH 27X BRD (SUTURE) IMPLANT
SUT VIC AB 4-0 PS2 27 (SUTURE) ×6 IMPLANT
SUT VIC AB 4-0 SH 27 (SUTURE) ×3
SUT VIC AB 4-0 SH 27XBRD (SUTURE) ×2 IMPLANT
SUT VIC AB 5-0 P-3 18X BRD (SUTURE) ×4 IMPLANT
SUT VIC AB 5-0 P3 18 (SUTURE) ×6
SUT VICRYL AB 3 0 TIES (SUTURE) IMPLANT
SYR 20CC LL (SYRINGE) ×3 IMPLANT
SYR 30ML LL (SYRINGE) ×3 IMPLANT
SYR 5ML LL (SYRINGE) ×6 IMPLANT
SYR BULB IRRIGATION 50ML (SYRINGE) ×3 IMPLANT
SYR CONTROL 10ML LL (SYRINGE) ×3 IMPLANT
TOWEL OR 17X26 4PK STRL BLUE (TOWEL DISPOSABLE) ×3 IMPLANT
WATER STERILE IRR 1000ML POUR (IV SOLUTION) ×6 IMPLANT
YANKAUER SUCT 12FT TUBE ARGYLE (SUCTIONS) ×3 IMPLANT

## 2012-12-07 NOTE — Anesthesia Preprocedure Evaluation (Signed)
Anesthesia Evaluation  Patient identified by MRN, date of birth, ID band Patient awake    Reviewed: Allergy & Precautions, H&P , NPO status , Patient's Chart, lab work & pertinent test results  Airway Mallampati: II TM Distance: >3 FB     Dental  (+) Teeth Intact and Caps   Pulmonary sleep apnea and Continuous Positive Airway Pressure Ventilation ,  breath sounds clear to auscultation        Cardiovascular hypertension, Pt. on medications + CAD and + Past MI Rhythm:Regular Rate:Normal     Neuro/Psych    GI/Hepatic GERD-  Medicated and Controlled,  Endo/Other  diabetes, Well Controlled, Type 2, Oral Hypoglycemic Agents  Renal/GU Renal InsufficiencyRenal disease     Musculoskeletal   Abdominal   Peds  Hematology   Anesthesia Other Findings   Reproductive/Obstetrics                           Anesthesia Physical Anesthesia Plan  ASA: III  Anesthesia Plan: MAC   Post-op Pain Management:    Induction: Intravenous  Airway Management Planned: Simple Face Mask  Additional Equipment:   Intra-op Plan:   Post-operative Plan:   Informed Consent: I have reviewed the patients History and Physical, chart, labs and discussed the procedure including the risks, benefits and alternatives for the proposed anesthesia with the patient or authorized representative who has indicated his/her understanding and acceptance.     Plan Discussed with:   Anesthesia Plan Comments:         Anesthesia Quick Evaluation

## 2012-12-07 NOTE — Progress Notes (Signed)
23 yr. Old African American male with multiple myeloma for replacement of porta cath.  Procedure and risks explained and informed consent obtained and labs reviewed.  No clinical change in H&P, dict# [id number]. Filed Vitals:   12/07/12 1055  BP: 105/64  Pulse:   Temp:   Resp: 18  pulse 70/min, temp. 98.4.  O2 sat 100% on 2L/min.

## 2012-12-07 NOTE — Op Note (Signed)
NAMEMITHUN, GOLDSWORTHY NO.:  [id number]  MEDICAL RECORD NO.:  [id number]  LOCATION:  [facility]                          FACILITY:  [facility]  PHYSICIAN:  Felicie Morn, M.D. DATE OF BIRTH:  12/08/1960  DATE OF PROCEDURE:  12/07/2012 DATE OF DISCHARGE:  12/07/2012                              OPERATIVE REPORT   DIAGNOSIS:  Multiple myeloma.  PROCEDURE: 1. Attempted replacement of Port-A-Cath from left subclavian over a     guidewire.  This was unsuccessful. 2. Attempted placement of Port-A-Cath via right subclavian.  SPECIMEN:  Port-A-Cath and catheter from left subclavian.  NOTE:  This is a 52 year old, morbidly obese, African American who has a diagnosis of multiple myeloma.  He had a Port-A-Cath placed in February 2013 and we were notified that this no longer was able to be used. There appeared to be some extravasation of dye when this was checked. We then made plans via the outpatient department for Port-A-Cath replacement.  We discussed complications not limited to, but including bleeding, infection, thrombosis, Port-A-Cath embolization, and then pneumothorax.  Informed consent was obtained.  GROSS OPERATIVE FINDINGS:  We were unable to advance the guidewire through this.  I left a catheter on the left side despite manipulation of this, so we were then removed the catheter and the infusion device on this side.  I noted approximately halfway down or so of the silastic catheter, there was a slit in it and there appeared to be a thrombus in the tip.  I then made the judgment to attempt to place this on the contralateral side as I figured there would be 1 of pseudocapsule formation and fibrosis on the left side that an attempt to cannulate the left subclavian again would be fraught with failure.  So, we then moved to the right infraclavicular area, then previously been draped with Betadine solution and attempted placement of a Port-A-Cath on the right side.  I was  able to cannulate the vein, however, I was unable to advance the guidewire, so we decided to cancel the procedure as I did not want to give the patient in pneumothorax and he was having a little discomfort.  He did not have any changes O2 sat, however, and we will make arrangements with the Oncology department the plan for further to have this placed with Interventional Radiology.  The wounds on the left side where I removed the catheter were then irrigated.  I ligated the area where the pseudocapsule had the silastic catheter coming from it and then I closed the wound with 4-0 Polysorb for the subcutaneous area and 5-0 Polysorb subcuticular.  Steri-Strips, Neosporin, and an OpSite dressing was applied with a 2 x 2 bandage. There was a puncture wound which I closed with 1 single suture subcuticular 4-0 Polysorb where I made an attempt to place a Port-A-Cath on the right side.  Prior to closure, all sponge, needle, and instrument counts were found to be correct.  Estimated blood loss was minimal, less than 25 mL.  The patient tolerated the procedure well, was taken to recovery room in satisfactory condition.  WOUND CLASSIFICATION:  Clean.  SPECIMEN:  As mentioned just the infusion device in the catheter  on the left side.  This will not require any pathology.     Felicie Morn, M.D.     WB/MEDQ  D:  12/07/2012  T:  12/07/2012  Job:  [id number]  cc:   Oncology Department

## 2012-12-07 NOTE — Brief Op Note (Signed)
12/07/2012  12:52 PM  PATIENT:  [name]  52 y.o. male  PRE-OPERATIVE DIAGNOSIS:  multiple myeloma  POST-OPERATIVE DIAGNOSIS:  multiple myeloma  PROCEDURE:  Procedure(s) with comments: REMOVAL PORT-A-CATH (Left) INSERTION PORT-A-CATH (N/A) - Attempted portacath placement on left and right side  SURGEON:  Surgeon(s) and Role:    * Scherry Ran, MD - Primary  PHYSICIAN ASSISTANT:   ASSISTANTS: none   ANESTHESIA:   IV sedation  EBL:  Total I/O In: 850 [I.V.:850] Out: -   BLOOD ADMINISTERED:none  DRAINS: none   LOCAL MEDICATIONS USED:  XYLOCAINE  1 % without epi ~ 10 cc.  SPECIMEN:  Source of Specimen:  porta cath and catheter fro L. subclavian.  DISPOSITION OF SPECIMEN:  N/A  COUNTS:  YES  TOURNIQUET:  * No tourniquets in log *  DICTATION: .Other Dictation: Dictation Number OR dict. #  [id number].  PLAN OF CARE: Discharge to home after PACU  PATIENT DISPOSITION:  PACU - hemodynamically stable.   Delay start of Pharmacological VTE agent (>24hrs) due to surgical blood loss or risk of bleeding: not applicable

## 2012-12-07 NOTE — Transfer of Care (Signed)
Immediate Anesthesia Transfer of Care Note  Patient: [name]  Procedure(s) Performed: Procedure(s) with comments: REMOVAL PORT-A-CATH (Left) INSERTION PORT-A-CATH (N/A) - Attempted portacath placement on left and right side  Patient Location: PACU  Anesthesia Type:MAC  Level of Consciousness: awake, alert  and patient cooperative  Airway & Oxygen Therapy: Patient Spontanous Breathing and Patient connected to face mask oxygen  Post-op Assessment: Report given to PACU RN, Post -op Vital signs reviewed and stable and Patient moving all extremities  Post vital signs: Reviewed and stable  Complications: No apparent anesthesia complications

## 2012-12-07 NOTE — Telephone Encounter (Signed)
Advised pt his insurance denied his PA for Xarelto, and a pt assistance form has been placed in his sample bag to be filled out and returned with income information, pt understood, will fill out form and return to office to be processed once completed

## 2012-12-07 NOTE — Progress Notes (Signed)
Pt. Awake and alert.  Dressings sites clean and dry.   Follow up CXR shows no pneumothorax on either side.  Filed Vitals:   12/07/12 1251  BP:   Pulse:   Temp: 97.5 F (36.4 C)  Resp:   BP 131/87, HR 67/min, resp. 16/min, O2 sat 2l/min as pre op.  Discharge and follow up arranged.

## 2012-12-07 NOTE — Anesthesia Postprocedure Evaluation (Signed)
  Anesthesia Post-op Note  Patient: [name]  Procedure(s) Performed: Procedure(s) with comments: REMOVAL PORT-A-CATH (Left) INSERTION PORT-A-CATH (N/A) - Attempted portacath placement on left and right side  Patient Location: PACU  Anesthesia Type:MAC  Level of Consciousness: awake, alert , oriented and patient cooperative  Airway and Oxygen Therapy: Patient Spontanous Breathing  Post-op Pain: none  Post-op Assessment: Post-op Vital signs reviewed, Patient's Cardiovascular Status Stable, Respiratory Function Stable, Patent Airway, No signs of Nausea or vomiting and Pain level controlled  Post-op Vital Signs: Reviewed and stable  Complications: No apparent anesthesia complications

## 2012-12-09 ENCOUNTER — Encounter (HOSPITAL_COMMUNITY): Payer: Self-pay | Admitting: General Surgery

## 2012-12-09 ENCOUNTER — Encounter (HOSPITAL_COMMUNITY): Payer: Self-pay | Admitting: Pharmacy Technician

## 2012-12-10 ENCOUNTER — Other Ambulatory Visit (HOSPITAL_COMMUNITY): Payer: Self-pay | Admitting: Oncology

## 2012-12-10 ENCOUNTER — Other Ambulatory Visit: Payer: Self-pay | Admitting: Radiology

## 2012-12-10 ENCOUNTER — Encounter (HOSPITAL_COMMUNITY): Payer: Self-pay

## 2012-12-10 ENCOUNTER — Ambulatory Visit (HOSPITAL_COMMUNITY)
Admission: RE | Admit: 2012-12-10 | Discharge: 2012-12-10 | Disposition: A | Discharge status: Home / Self Care | Payer: Managed Care, Other (non HMO) | Source: Ambulatory Visit | Attending: Oncology | Admitting: Oncology

## 2012-12-10 ENCOUNTER — Ambulatory Visit (HOSPITAL_COMMUNITY)
Admission: RE | Admit: 2012-12-10 | Discharge: 2012-12-10 | Disposition: A | Discharge status: Home / Self Care | Payer: Managed Care, Other (non HMO) | Source: Ambulatory Visit | Attending: General Surgery | Admitting: General Surgery

## 2012-12-10 DIAGNOSIS — E119 Type 2 diabetes mellitus without complications: Secondary | ICD-10-CM | POA: Insufficient documentation

## 2012-12-10 DIAGNOSIS — I10 Essential (primary) hypertension: Secondary | ICD-10-CM | POA: Insufficient documentation

## 2012-12-10 DIAGNOSIS — K219 Gastro-esophageal reflux disease without esophagitis: Secondary | ICD-10-CM | POA: Insufficient documentation

## 2012-12-10 DIAGNOSIS — E785 Hyperlipidemia, unspecified: Secondary | ICD-10-CM | POA: Insufficient documentation

## 2012-12-10 DIAGNOSIS — Z452 Encounter for adjustment and management of vascular access device: Secondary | ICD-10-CM

## 2012-12-10 DIAGNOSIS — I2510 Atherosclerotic heart disease of native coronary artery without angina pectoris: Secondary | ICD-10-CM | POA: Insufficient documentation

## 2012-12-10 DIAGNOSIS — G4730 Sleep apnea, unspecified: Secondary | ICD-10-CM | POA: Insufficient documentation

## 2012-12-10 DIAGNOSIS — C9000 Multiple myeloma not having achieved remission: Secondary | ICD-10-CM | POA: Insufficient documentation

## 2012-12-10 DIAGNOSIS — Z794 Long term (current) use of insulin: Secondary | ICD-10-CM | POA: Insufficient documentation

## 2012-12-10 DIAGNOSIS — I252 Old myocardial infarction: Secondary | ICD-10-CM | POA: Insufficient documentation

## 2012-12-10 DIAGNOSIS — E669 Obesity, unspecified: Secondary | ICD-10-CM | POA: Insufficient documentation

## 2012-12-10 LAB — CBC
HCT: 33 % — ABNORMAL LOW (ref 39.0–52.0)
Hemoglobin: 11 g/dL — ABNORMAL LOW (ref 13.0–17.0)
MCH: 30.3 pg (ref 26.0–34.0)
MCHC: 33.3 g/dL (ref 30.0–36.0)
MCV: 90.9 fL (ref 78.0–100.0)
Platelets: 157 10*3/uL (ref 150–400)
RBC: 3.63 MIL/uL — ABNORMAL LOW (ref 4.22–5.81)
RDW: 16.4 % — ABNORMAL HIGH (ref 11.5–15.5)
WBC: 7.1 10*3/uL (ref 4.0–10.5)

## 2012-12-10 LAB — PROTIME-INR
INR: 1.04 (ref 0.00–1.49)
Prothrombin Time: 13.4 seconds (ref 11.6–15.2)

## 2012-12-10 LAB — APTT: aPTT: 37 seconds (ref 24–37)

## 2012-12-10 LAB — GLUCOSE, CAPILLARY: Glucose-Capillary: 155 mg/dL — ABNORMAL HIGH (ref 70–99)

## 2012-12-10 MED ORDER — MIDAZOLAM HCL 2 MG/2ML IJ SOLN
INTRAMUSCULAR | Status: AC | PRN
  Administered 2012-12-10 (×2): 1 mg via INTRAVENOUS

## 2012-12-10 MED ORDER — MIDAZOLAM HCL 2 MG/2ML IJ SOLN
INTRAMUSCULAR | Status: AC
  Filled 2012-12-10: qty 6

## 2012-12-10 MED ORDER — LIDOCAINE HCL 1 % IJ SOLN
INTRAMUSCULAR | Status: AC
  Filled 2012-12-10: qty 20

## 2012-12-10 MED ORDER — FENTANYL CITRATE 0.05 MG/ML IJ SOLN
INTRAMUSCULAR | Status: AC | PRN
  Administered 2012-12-10 (×3): 50 ug via INTRAVENOUS

## 2012-12-10 MED ORDER — HEPARIN SOD (PORK) LOCK FLUSH 100 UNIT/ML IV SOLN
INTRAVENOUS | Status: AC | PRN
  Administered 2012-12-10: 500 [IU]

## 2012-12-10 MED ORDER — SODIUM CHLORIDE 0.9 % IV SOLN
Freq: Once | INTRAVENOUS | Status: AC
  Administered 2012-12-10: 12:00:00 via INTRAVENOUS

## 2012-12-10 MED ORDER — FENTANYL CITRATE 0.05 MG/ML IJ SOLN
INTRAMUSCULAR | Status: AC
  Filled 2012-12-10: qty 6

## 2012-12-10 MED ORDER — CEFAZOLIN SODIUM-DEXTROSE 2-3 GM-% IV SOLR
2.0000 g | Freq: Once | INTRAVENOUS | Status: AC
  Administered 2012-12-10: 2 g via INTRAVENOUS

## 2012-12-10 MED ORDER — CEFAZOLIN SODIUM-DEXTROSE 2-3 GM-% IV SOLR
INTRAVENOUS | Status: AC
  Filled 2012-12-10: qty 50

## 2012-12-10 NOTE — Procedures (Signed)
Procedure:  Right IJ port placement Findings:  Cath tip at cavoatrial junction.  No PTX.

## 2012-12-10 NOTE — H&P (Signed)
Agree 

## 2012-12-10 NOTE — H&P (Signed)
Chief Complaint: "I'm here for a new port" Referring Physician:Kefalas HPI: [name] is an 52 y.o. male with hx of multiple myeloma. He had a dysfunctional port on the left side and this was removed up at [facility]. They attempted to place a new port on the right subclavian but were nor able to complete. He is now referred to IR for [facility] placement. PMHx and meds reviewed. He is on Xarelto but held it today and yesterday. Last dose was 2 days ago. He otherwise feels well, no recent fevers, illness.  Past Medical History:  Past Medical History  Diagnosis Date  . Arteriosclerotic cardiovascular disease (ASCVD)     MI-2000s; stent to the proximal LAD and diagonal in 2001; stress nuclear in 2008-impaired exercise capacity, left ventricular dilatation, moderately to severely depressed EF, apical, inferior and anteroseptal scar  . Hypertension   . 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 Diabetes mellitus     Insulin  . GERD (gastroesophageal reflux disease)   . Pedal edema     Venous insufficiency  . Obesity   . Hyperlipidemia   . Injection site reaction   . Cellulitis of leg     both legs  . Chronic kidney disease, stage 3, mod decreased GFR     Creatinine of 1.84 in 06/2011 and 1.5 in 07/2011  . Chronic diarrhea   . Sleep apnea     uses cpap  . Ulcer   . Atrial flutter   . Multiple myeloma 07/01/2011  . Myocardial infarction 2000  . Gout     Past Surgical History:  Past Surgical History  Procedure Laterality Date  . Laparoscopic gastric banding  2006    has been removed  . Wrist surgery      Left; removal of bone fragment  . Incision and drainage abscess anal    . Abscess drainage      Scrotal  . Bone marrow biopsy  05/13/11  . Portacath placement  07/07/2011    Procedure: INSERTION PORT-A-CATH;  Surgeon: Scherry Ran, MD;  Location: [facility] ORS;  Service: General;  Laterality: N/A;  . Colonoscopy  11/28/2011    Procedure: COLONOSCOPY;  Surgeon: Rogene Houston, MD;  Location: [facility];  Service: Endoscopy;  Laterality: N/A;  930  . Esophagogastroduodenoscopy  01/02/2012    Procedure: ESOPHAGOGASTRODUODENOSCOPY (EGD);  Surgeon: Rogene Houston, MD;  Location: [facility];  Service: Endoscopy;  Laterality: N/A;  100  . Esophageal biopsy  01/02/2012    Procedure: BIOPSY;  Surgeon: Rogene Houston, MD;  Location: [facility];  Service: Endoscopy;  Laterality: N/A;  . Esophagogastroduodenoscopy N/A 09/20/2012    Procedure: ESOPHAGOGASTRODUODENOSCOPY (EGD);  Surgeon: Rogene Houston, MD;  Location: [facility];  Service: Endoscopy;  Laterality: N/A;  . Eus N/A 10/07/2012    Procedure: UPPER ENDOSCOPIC ULTRASOUND (EUS) LINEAR;  Surgeon: Milus Banister, MD;  Location: [facility];  Service: Endoscopy;  Laterality: N/A;  . Cardioversion N/A 10/13/2012    Procedure: CARDIOVERSION;  Surgeon: Yehuda Savannah, MD;  Location: [facility];  Service: Cardiovascular;  Laterality: N/A;  . Cardiac catheterization      cardiac stent  . Port-a-cath removal Left 12/07/2012    Procedure: REMOVAL PORT-A-CATH;  Surgeon: Scherry Ran, MD;  Location: [facility];  Service: General;  Laterality: Left;  . Portacath placement N/A 12/07/2012    Procedure: INSERTION PORT-A-CATH;  Surgeon: Scherry Ran, MD;  Location: [facility];  Service: General;  Laterality:  N/A;  Attempted portacath placement on left and right side    Family History:  Family History  Problem Relation Age of Onset  . Heart disease Mother   . Cancer Mother   . Diabetes Father   . Anesthesia problems Neg Hx   . Hypotension Neg Hx   . Malignant hyperthermia Neg Hx   . Pseudochol deficiency Neg Hx   . Arthritis      Social History:  reports that he quit smoking about 11 years ago. His smoking use included Cigarettes and Cigars. He smoked 0.00 packs per day for 1 year. He has never used smokeless tobacco. He reports that he does not drink alcohol or use illicit drugs.  Allergies: No Known  Allergies  Medications:   Medication List    ASK your doctor about these medications       acyclovir 400 MG tablet  Commonly known as:  ZOVIRAX  Take 1 tablet (400 mg total) by mouth every morning.     allopurinol 300 MG tablet  Commonly known as:  ZYLOPRIM  Take 1 tablet (300 mg total) by mouth daily.     atorvastatin 80 MG tablet  Commonly known as:  LIPITOR  Take 80 mg by mouth at bedtime.     calcium-vitamin D 500-200 MG-UNIT per tablet  Commonly known as:  OSCAL WITH D  Take 1 tablet by mouth daily with breakfast.     carvedilol 3.125 MG tablet  Commonly known as:  COREG  Take 1 tablet (3.125 mg total) by mouth 2 (two) times daily with a meal.     dexamethasone 4 MG tablet  Commonly known as:  DECADRON  Take 40 mg by mouth once a week. Takes 5 tabs bid every Friday.     DEXILANT 60 MG capsule  Generic drug:  dexlansoprazole  Take 60 mg by mouth every morning.     diphenoxylate-atropine 2.5-0.025 MG per tablet  Commonly known as:  LOMOTIL  Take 2 tablets by mouth every morning. Max 8 per day     HYDROcodone-acetaminophen 5-325 MG per tablet  Commonly known as:  NORCO/VICODIN  Take 1 tablet by mouth every 4 (four) hours as needed for pain.     insulin aspart 100 UNIT/ML injection  Commonly known as:  novoLOG  Inject 5-20 Units into the skin 3 (three) times daily before meals. Sliding scale     insulin detemir 100 UNIT/ML injection  Commonly known as:  LEVEMIR  Inject 40 Units into the skin every morning.     lenalidomide 15 MG capsule  Commonly known as:  [name]  Take 15 mg by mouth daily. 7 days on, 7 days off.  12/09/12: off, switch on Sun     lidocaine-prilocaine cream  Commonly known as:  EMLA  Apply 1 application topically as needed (for port-a-cath access).     loperamide 2 MG capsule  Commonly known as:  IMODIUM  Take 1 capsule (2 mg total) by mouth 4 (four) times daily as needed for diarrhea or loose stools.     loratadine 10 MG tablet   Commonly known as:  CLARITIN  Take 10 mg by mouth every morning.     multivitamins ther. w/minerals Tabs  Take 1 tablet by mouth every morning.     nitroGLYCERIN 0.4 MG SL tablet  Commonly known as:  NITROSTAT  Place 1 tablet (0.4 mg total) under the tongue every 5 (five) minutes as needed for chest pain.     ondansetron 8  MG tablet  Commonly known as:  ZOFRAN  Take 1 tablet (8 mg total) by mouth every 4 (four) hours as needed for nausea.     oxyCODONE-acetaminophen 5-325 MG per tablet  Commonly known as:  PERCOCET  Take 2 tablets by mouth every 4 (four) hours as needed for pain.     Pancrelipase (Lip-Prot-Amyl) 36000 UNITS Cpep  Take 36,000-108,000 Units by mouth 3 (three) times daily. 3 caps with meals, 1 cap with snacks     potassium chloride SA 20 MEQ tablet  Commonly known as:  K-DUR,KLOR-CON  Take 40 mEq by mouth 2 (two) times daily as needed (taken while taking Torsemide).     Rivaroxaban 20 MG Tabs  Commonly known as:  XARELTO  Take 20 mg by mouth at bedtime.     sulfamethoxazole-trimethoprim 800-160 MG per tablet  Commonly known as:  BACTRIM DS  Take 1 tablet by mouth every Monday, Wednesday, and Friday.     torsemide 20 MG tablet  Commonly known as:  DEMADEX  Take 20 mg by mouth daily as needed (for fluid retention).     Vitamin D 2000 UNITS Caps  Take 1 capsule by mouth every morning.     ZOMETA IV  Inject 2 mg into the vein every 28 (twenty-eight) days.        Please HPI for pertinent positives, otherwise complete 10 system ROS negative.  Physical Exam: BP 113/75  Pulse 66  Temp(Src) 98.3 F (36.8 C) (Oral)  SpO2 99% There is no weight on file to calculate BMI.   General Appearance:  Alert, cooperative, no distress, appears stated age  Head:  Normocephalic, without obvious abnormality, atraumatic  ENT: Unremarkable  Neck: Supple, symmetrical, trachea midline  Lungs:   Clear to auscultation bilaterally, no w/r/r, respirations unlabored without  use of accessory muscles.  Chest Wall:  (L)chest dressing clean, dry. Rt upper chest with ecchymosis and small hematoma related to recent attempt at port placement.  Heart:  Regular rate and rhythm,   Neurologic: Normal affect, no gross deficits.   Results for orders placed during the hospital encounter of 12/10/12 (from the past 48 hour(s))  APTT     Status: None   Collection Time    12/10/12 12:05 PM      Result Value Range   aPTT 37  24 - 37 seconds   Comment:            IF BASELINE aPTT IS ELEVATED,     SUGGEST PATIENT RISK ASSESSMENT     BE USED TO DETERMINE APPROPRIATE     ANTICOAGULANT THERAPY.  CBC     Status: Abnormal   Collection Time    12/10/12 12:05 PM      Result Value Range   WBC 7.1  4.0 - 10.5 K/uL   RBC 3.63 (*) 4.22 - 5.81 MIL/uL   Hemoglobin 11.0 (*) 13.0 - 17.0 g/dL   HCT 33.0 (*) 39.0 - 52.0 %   MCV 90.9  78.0 - 100.0 fL   MCH 30.3  26.0 - 34.0 pg   MCHC 33.3  30.0 - 36.0 g/dL   RDW 16.4 (*) 11.5 - 15.5 %   Platelets 157  150 - 400 K/uL  PROTIME-INR     Status: None   Collection Time    12/10/12 12:05 PM      Result Value Range   Prothrombin Time 13.4  11.6 - 15.2 seconds   INR 1.04  0.00 - 1.49  GLUCOSE, CAPILLARY  Status: Abnormal   Collection Time    12/10/12 12:16 PM      Result Value Range   Glucose-Capillary 155 (*) 70 - 99 mg/dL   Comment 1 Documented in Chart     No results found.  Assessment/Plan Multiple myeloma Discussed port placement, risks, complications, use of sedation Labs reviewed. Consent signed in chart  Ascencion Dike PA-C 12/10/2012, 1:26 PM

## 2012-12-13 ENCOUNTER — Other Ambulatory Visit (HOSPITAL_COMMUNITY): Payer: Self-pay | Admitting: Oncology

## 2012-12-13 DIAGNOSIS — C9000 Multiple myeloma not having achieved remission: Secondary | ICD-10-CM

## 2012-12-13 MED ORDER — SULFAMETHOXAZOLE-TMP DS 800-160 MG PO TABS: 1.0000 | ORAL_TABLET | ORAL | Status: DC

## 2012-12-18 ENCOUNTER — Encounter (HOSPITAL_COMMUNITY): Payer: Self-pay | Admitting: Emergency Medicine

## 2012-12-18 ENCOUNTER — Emergency Department (HOSPITAL_COMMUNITY)
Admission: EM | Admit: 2012-12-18 | Discharge: 2012-12-18 | Disposition: A | Discharge status: Home / Self Care | Payer: Managed Care, Other (non HMO) | Source: Home / Self Care | Attending: Emergency Medicine | Admitting: Emergency Medicine

## 2012-12-18 DIAGNOSIS — I83009 Varicose veins of unspecified lower extremity with ulcer of unspecified site: Secondary | ICD-10-CM

## 2012-12-18 DIAGNOSIS — L97909 Non-pressure chronic ulcer of unspecified part of unspecified lower leg with unspecified severity: Secondary | ICD-10-CM

## 2012-12-18 DIAGNOSIS — I83029 Varicose veins of left lower extremity with ulcer of unspecified site: Secondary | ICD-10-CM

## 2012-12-18 MED ORDER — CEPHALEXIN 500 MG PO CAPS: 500.0000 mg | ORAL_CAPSULE | Freq: Three times a day (TID) | ORAL | Status: DC

## 2012-12-18 MED ORDER — MUPIROCIN 2 % EX OINT: TOPICAL_OINTMENT | Freq: Three times a day (TID) | CUTANEOUS | Status: DC

## 2012-12-18 NOTE — ED Notes (Signed)
Reports swelling of left leg x 1 wk.  Swelling has subsided with elevation of legs.  Pt states that he had developed a small fluid pocket that ruptured this a.m and wants to have area checked to avoid infection.  Pt has used peroxide to cleanse the area.  Mild pain.

## 2012-12-18 NOTE — ED Provider Notes (Addendum)
Chief Complaint:   Chief Complaint  Patient presents with  . Leg Swelling    left leg swelling with fluid pocket.     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 He wears support hose. Because of his job he's been on his feet for the past several days and this morning developed a small blister on his left pretibial surface. This was not painful and draining a little bit of clear fluid. He's concerned because he did have a large ulcer on that leg and ended up having to go to the wound center several years ago.  Review of Systems:  Other than noted above, the patient denies any of the following symptoms: Systemic:  No fever, chills, sweats, weight gain or loss. Respiratory:  No coughing, wheezing, or shortness of breath. Cardiac:  No chest pain, tightness, pressure or syncope. GI:  No abdominal pain, swelling, distension, nausea, or vomiting. GU:  No dysuria, frequency, or hematuria. Ext:  No joint pain, muscle pain, or weakness. Skin:  No rash or itching. Neuro:  No paresthesias.  [name]:  Past medical history, family history, social history, meds, and allergies were reviewed.  He has no medication allergies. He takes allopurinol, Lipitor, Coreg, NovoLog, Levemir,[name], Zofran, Percocet, Xarelto, and Demadex. He has a history of coronary artery disease, hypertension, multiple myeloma, diabetes, GERD, pedal edema, hyperlipidemia, chronic kidney disease, sleep apnea, atrial flutter, and gout.  Physical Exam:   Vital signs:  BP 114/71  Pulse 98  Temp(Src) 98.2 F (36.8 C) (Oral)  Resp 21  SpO2 95% Gen:  Alert, oriented, in no distress. Neck:  No tenderness, adenopathy, or JVD. Lungs:  Breath sounds clear and equal bilaterally.  No  rales, rhonchi or wheezes. Heart:  Regular rhythm, no gallops or murmers. Abdomen:  Soft, nontender, no organomegaly or mass. Ext:  There is nonpitting edema of both legs with stasis changes there is a tiny blister on the left pretibial surface measuring no more than a centimeter in size. This was very shallow and draining just a little bit of clear fluid. Neuro:  Alert and oriented times 3.  No muscle weakness.  Sensation intact to light touch. Skin:  Warm and dry.  No rash or skin lesions.  Course in Urgent Care Center:  Antibiotic ointment was applied to the area, a nonstick dressing, then the entire lower leg was wrapped from the foot to the knee with a layer of Webril and 3 layers of Ace wrap. He was told to stay off his feet, elevate the leg, and then begin to apply antibiotic ointment and use his support hose starting next week.  Assessment:  The encounter diagnosis was Stasis ulcer, left.  This is very minimal right now, but he is appropriately concerned about the possibility of another stasis ulcer. I urged him to followup with his primary care doctor next week. He'll be covered with cephalexin and mupirocin ointment to prevent infection.  Plan:   1.  The following meds were prescribed:   Discharge Medication List as of 12/18/2012 10:54 AM    START taking these medications   Details  cephALEXin (KEFLEX) 500 MG capsule Take 1 capsule (500 mg total) by mouth 3 (three) times daily., Starting 12/18/2012, Until Discontinued, Normal    mupirocin  ointment (BACTROBAN) 2 % Apply topically 3 (three) times daily., Starting 12/18/2012, Until Discontinued, Normal       2.  The patient was instructed in symptomatic care and handouts were given. 3.  The patient was told to return if becoming worse in any way, if no better in 3 or 4 days, and given some red flag symptoms such as worsening of the blister or any evidence of infection that would indicate earlier return. 4.  Follow up with his primary  care doctor next week.    Harden Mo, MD 12/18/12 2008  Harden Mo, MD 12/20/12 [phone]

## 2012-12-20 ENCOUNTER — Ambulatory Visit (INDEPENDENT_AMBULATORY_CARE_PROVIDER_SITE_OTHER): Payer: Managed Care, Other (non HMO) | Admitting: Internal Medicine

## 2012-12-20 ENCOUNTER — Other Ambulatory Visit (HOSPITAL_COMMUNITY): Payer: Self-pay | Admitting: Oncology

## 2012-12-20 DIAGNOSIS — C9000 Multiple myeloma not having achieved remission: Secondary | ICD-10-CM

## 2012-12-20 DIAGNOSIS — R197 Diarrhea, unspecified: Secondary | ICD-10-CM

## 2012-12-20 MED ORDER — DIPHENOXYLATE-ATROPINE 2.5-0.025 MG PO TABS: 2.0000 | ORAL_TABLET | Freq: Every morning | ORAL | Status: DC

## 2012-12-20 MED ORDER — ALLOPURINOL 300 MG PO TABS: 300.0000 mg | ORAL_TABLET | Freq: Every day | ORAL | Status: DC

## 2012-12-20 MED ORDER — ACYCLOVIR 400 MG PO TABS: 400.0000 mg | ORAL_TABLET | Freq: Every morning | ORAL | Status: DC

## 2012-12-21 ENCOUNTER — Ambulatory Visit (HOSPITAL_COMMUNITY): Payer: Managed Care, Other (non HMO)

## 2012-12-22 ENCOUNTER — Telehealth: Payer: Self-pay | Admitting: *Deleted

## 2012-12-22 ENCOUNTER — Encounter: Payer: Self-pay | Admitting: *Deleted

## 2012-12-22 DIAGNOSIS — C9000 Multiple myeloma not having achieved remission: Secondary | ICD-10-CM

## 2012-12-22 DIAGNOSIS — I10 Essential (primary) hypertension: Secondary | ICD-10-CM

## 2012-12-22 DIAGNOSIS — E782 Mixed hyperlipidemia: Secondary | ICD-10-CM

## 2012-12-22 NOTE — Telephone Encounter (Signed)
.  Letter mailed to patient TO HAVE LABS DRAWN.

## 2012-12-23 ENCOUNTER — Encounter (HOSPITAL_COMMUNITY): Payer: Managed Care, Other (non HMO) | Attending: Oncology

## 2012-12-23 DIAGNOSIS — X58XXXA Exposure to other specified factors, initial encounter: Secondary | ICD-10-CM | POA: Insufficient documentation

## 2012-12-23 DIAGNOSIS — C9000 Multiple myeloma not having achieved remission: Secondary | ICD-10-CM

## 2012-12-23 DIAGNOSIS — Z95828 Presence of other vascular implants and grafts: Secondary | ICD-10-CM

## 2012-12-23 DIAGNOSIS — Z9889 Other specified postprocedural states: Secondary | ICD-10-CM | POA: Insufficient documentation

## 2012-12-23 LAB — COMPREHENSIVE METABOLIC PANEL
ALT: 65 U/L — ABNORMAL HIGH (ref 0–53)
AST: 44 U/L — ABNORMAL HIGH (ref 0–37)
Albumin: 3.3 g/dL — ABNORMAL LOW (ref 3.5–5.2)
Alkaline Phosphatase: 88 U/L (ref 39–117)
BUN: 29 mg/dL — ABNORMAL HIGH (ref 6–23)
CO2: 18 mEq/L — ABNORMAL LOW (ref 19–32)
Calcium: 8.1 mg/dL — ABNORMAL LOW (ref 8.4–10.5)
Chloride: 111 mEq/L (ref 96–112)
Creatinine, Ser: 1.91 mg/dL — ABNORMAL HIGH (ref 0.50–1.35)
GFR calc Af Amer: 45 mL/min — ABNORMAL LOW (ref >90)
GFR calc non Af Amer: 38 mL/min — ABNORMAL LOW (ref >90)
Glucose, Bld: 124 mg/dL — ABNORMAL HIGH (ref 70–99)
Potassium: 3.9 mEq/L (ref 3.5–5.1)
Sodium: 140 mEq/L (ref 135–145)
Total Bilirubin: 0.6 mg/dL (ref 0.3–1.2)
Total Protein: 5.9 g/dL — ABNORMAL LOW (ref 6.0–8.3)

## 2012-12-23 LAB — CBC WITH DIFFERENTIAL/PLATELET
Basophils Absolute: 0 10*3/uL (ref 0.0–0.1)
Basophils Relative: 1 % (ref 0–1)
Eosinophils Absolute: 0.2 10*3/uL (ref 0.0–0.7)
Eosinophils Relative: 3 % (ref 0–5)
HCT: 33.3 % — ABNORMAL LOW (ref 39.0–52.0)
Hemoglobin: 11.1 g/dL — ABNORMAL LOW (ref 13.0–17.0)
Lymphocytes Relative: 37 % (ref 12–46)
Lymphs Abs: 2.4 10*3/uL (ref 0.7–4.0)
MCH: 30.9 pg (ref 26.0–34.0)
MCHC: 33.3 g/dL (ref 30.0–36.0)
MCV: 92.8 fL (ref 78.0–100.0)
Monocytes Absolute: 0.5 10*3/uL (ref 0.1–1.0)
Monocytes Relative: 8 % (ref 3–12)
Neutro Abs: 3.3 10*3/uL (ref 1.7–7.7)
Neutrophils Relative %: 52 % (ref 43–77)
Platelets: 142 10*3/uL — ABNORMAL LOW (ref 150–400)
RBC: 3.59 MIL/uL — ABNORMAL LOW (ref 4.22–5.81)
RDW: 15.9 % — ABNORMAL HIGH (ref 11.5–15.5)
WBC: 6.5 10*3/uL (ref 4.0–10.5)

## 2012-12-23 MED ORDER — HEPARIN SOD (PORK) LOCK FLUSH 100 UNIT/ML IV SOLN
500.0000 [IU] | Freq: Once | INTRAVENOUS | Status: AC
  Administered 2012-12-23: 500 [IU] via INTRAVENOUS
  Filled 2012-12-23: qty 5

## 2012-12-23 MED ORDER — HEPARIN SOD (PORK) LOCK FLUSH 100 UNIT/ML IV SOLN
INTRAVENOUS | Status: AC
  Filled 2012-12-23: qty 5

## 2012-12-23 MED ORDER — SODIUM CHLORIDE 0.9 % IJ SOLN
10.0000 mL | INTRAMUSCULAR | Status: DC | PRN
  Administered 2012-12-23: 10 mL via INTRAVENOUS
  Filled 2012-12-23: qty 10

## 2012-12-23 MED ORDER — SODIUM CHLORIDE 0.9 % IV SOLN
INTRAVENOUS | Status: DC
  Administered 2012-12-23: 16:00:00 via INTRAVENOUS

## 2012-12-23 MED ORDER — ZOLEDRONIC ACID 4 MG/5ML IV CONC
2.0000 mg | Freq: Once | INTRAVENOUS | Status: AC
  Administered 2012-12-23: 2 mg via INTRAVENOUS
  Filled 2012-12-23: qty 2.5

## 2012-12-23 NOTE — Progress Notes (Signed)
Tolerated well

## 2012-12-24 LAB — KAPPA/LAMBDA LIGHT CHAINS
Kappa free light chain: 2.31 mg/dL — ABNORMAL HIGH (ref 0.33–1.94)
Kappa, lambda light chain ratio: 1.12 (ref 0.26–1.65)
Lambda free light chains: 2.06 mg/dL (ref 0.57–2.63)

## 2012-12-27 ENCOUNTER — Other Ambulatory Visit (HOSPITAL_COMMUNITY): Payer: Self-pay | Admitting: Oncology

## 2012-12-27 ENCOUNTER — Telehealth: Payer: Self-pay | Admitting: *Deleted

## 2012-12-27 DIAGNOSIS — C9000 Multiple myeloma not having achieved remission: Secondary | ICD-10-CM

## 2012-12-27 LAB — MULTIPLE MYELOMA PANEL, SERUM
Albumin ELP: 61.2 % (ref 55.8–66.1)
Alpha-1-Globulin: 5.9 % — ABNORMAL HIGH (ref 2.9–4.9)
Alpha-2-Globulin: 12.3 % — ABNORMAL HIGH (ref 7.1–11.8)
Beta 2: 5.3 % (ref 3.2–6.5)
Beta Globulin: 7 % (ref 4.7–7.2)
Gamma Globulin: 8.3 % — ABNORMAL LOW (ref 11.1–18.8)
IgA: 61 mg/dL — ABNORMAL LOW (ref 68–379)
IgG (Immunoglobin G), Serum: 402 mg/dL — ABNORMAL LOW (ref 650–1600)
IgM, Serum: 65 mg/dL (ref 41–251)
M-Spike, %: NOT DETECTED g/dL
Total Protein: 5.3 g/dL — ABNORMAL LOW (ref 6.0–8.3)

## 2012-12-27 MED ORDER — SULFAMETHOXAZOLE-TMP DS 800-160 MG PO TABS: 1.0000 | ORAL_TABLET | ORAL | Status: DC

## 2012-12-27 MED ORDER — DEXAMETHASONE 4 MG PO TABS: 40.0000 mg | ORAL_TABLET | ORAL | Status: DC

## 2012-12-27 NOTE — Telephone Encounter (Signed)
Called pt informed him we had samples and they are at front desk ready to pick up.

## 2012-12-27 NOTE — Telephone Encounter (Signed)
Pt needs samples of xarelto 20 mg.

## 2012-12-28 ENCOUNTER — Encounter: Payer: Self-pay | Admitting: *Deleted

## 2012-12-28 ENCOUNTER — Other Ambulatory Visit: Payer: Self-pay | Admitting: *Deleted

## 2012-12-28 DIAGNOSIS — I10 Essential (primary) hypertension: Secondary | ICD-10-CM

## 2012-12-28 DIAGNOSIS — E782 Mixed hyperlipidemia: Secondary | ICD-10-CM

## 2012-12-29 ENCOUNTER — Other Ambulatory Visit (HOSPITAL_COMMUNITY): Payer: Managed Care, Other (non HMO)

## 2012-12-29 ENCOUNTER — Other Ambulatory Visit (HOSPITAL_COMMUNITY): Payer: Self-pay | Admitting: Oncology

## 2012-12-29 DIAGNOSIS — C9000 Multiple myeloma not having achieved remission: Secondary | ICD-10-CM

## 2012-12-29 MED ORDER — LENALIDOMIDE 15 MG PO CAPS: 15.0000 mg | ORAL_CAPSULE | Freq: Every day | ORAL | Status: DC

## 2012-12-31 ENCOUNTER — Other Ambulatory Visit (HOSPITAL_COMMUNITY): Payer: Self-pay | Admitting: Oncology

## 2012-12-31 DIAGNOSIS — C9000 Multiple myeloma not having achieved remission: Secondary | ICD-10-CM

## 2012-12-31 MED ORDER — LENALIDOMIDE 5 MG PO CAPS: ORAL_CAPSULE | ORAL | Status: DC

## 2013-01-04 ENCOUNTER — Telehealth: Payer: Self-pay | Admitting: *Deleted

## 2013-01-04 NOTE — Telephone Encounter (Signed)
.  left message to have patient return my call.  

## 2013-01-05 MED ORDER — RIVAROXABAN 20 MG PO TABS: 20.0000 mg | ORAL_TABLET | Freq: Every day | ORAL | Status: DC

## 2013-01-05 NOTE — Telephone Encounter (Signed)
Informed pt his xarelto 20mg  has been approved per [employer] indefinatly, pt understood and sent refill to [facility] per pt request via escribe

## 2013-01-10 ENCOUNTER — Ambulatory Visit (HOSPITAL_COMMUNITY): Payer: Managed Care, Other (non HMO)

## 2013-01-14 ENCOUNTER — Encounter (HOSPITAL_COMMUNITY): Payer: Self-pay

## 2013-01-20 ENCOUNTER — Encounter: Payer: Self-pay | Admitting: Oncology

## 2013-01-20 ENCOUNTER — Ambulatory Visit (HOSPITAL_COMMUNITY): Payer: Managed Care, Other (non HMO)

## 2013-01-21 ENCOUNTER — Encounter (HOSPITAL_COMMUNITY): Payer: Managed Care, Other (non HMO) | Attending: Oncology

## 2013-01-21 VITALS — BP 115/61 | HR 67 | Temp 98.3°F | Resp 20

## 2013-01-21 DIAGNOSIS — C9000 Multiple myeloma not having achieved remission: Secondary | ICD-10-CM

## 2013-01-21 DIAGNOSIS — X58XXXA Exposure to other specified factors, initial encounter: Secondary | ICD-10-CM | POA: Insufficient documentation

## 2013-01-21 LAB — CBC WITH DIFFERENTIAL/PLATELET
Basophils Absolute: 0 10*3/uL (ref 0.0–0.1)
Basophils Relative: 0 % (ref 0–1)
Eosinophils Absolute: 0 10*3/uL (ref 0.0–0.7)
Eosinophils Relative: 0 % (ref 0–5)
HCT: 34.7 % — ABNORMAL LOW (ref 39.0–52.0)
Hemoglobin: 11.4 g/dL — ABNORMAL LOW (ref 13.0–17.0)
Lymphocytes Relative: 8 % — ABNORMAL LOW (ref 12–46)
Lymphs Abs: 0.5 10*3/uL — ABNORMAL LOW (ref 0.7–4.0)
MCH: 30.6 pg (ref 26.0–34.0)
MCHC: 32.9 g/dL (ref 30.0–36.0)
MCV: 93 fL (ref 78.0–100.0)
Monocytes Absolute: 0.1 10*3/uL (ref 0.1–1.0)
Monocytes Relative: 1 % — ABNORMAL LOW (ref 3–12)
Neutro Abs: 5.8 10*3/uL (ref 1.7–7.7)
Neutrophils Relative %: 91 % — ABNORMAL HIGH (ref 43–77)
Platelets: 158 10*3/uL (ref 150–400)
RBC: 3.73 MIL/uL — ABNORMAL LOW (ref 4.22–5.81)
RDW: 15.5 % (ref 11.5–15.5)
WBC: 6.4 10*3/uL (ref 4.0–10.5)

## 2013-01-21 LAB — COMPREHENSIVE METABOLIC PANEL
ALT: 96 U/L — ABNORMAL HIGH (ref 0–53)
AST: 52 U/L — ABNORMAL HIGH (ref 0–37)
Albumin: 3.6 g/dL (ref 3.5–5.2)
Alkaline Phosphatase: 98 U/L (ref 39–117)
BUN: 28 mg/dL — ABNORMAL HIGH (ref 6–23)
CO2: 19 mEq/L (ref 19–32)
Calcium: 8.9 mg/dL (ref 8.4–10.5)
Chloride: 108 mEq/L (ref 96–112)
Creatinine, Ser: 1.46 mg/dL — ABNORMAL HIGH (ref 0.50–1.35)
GFR calc Af Amer: 61 mL/min — ABNORMAL LOW (ref >90)
GFR calc non Af Amer: 53 mL/min — ABNORMAL LOW (ref >90)
Glucose, Bld: 243 mg/dL — ABNORMAL HIGH (ref 70–99)
Potassium: 3.5 mEq/L (ref 3.5–5.1)
Sodium: 139 mEq/L (ref 135–145)
Total Bilirubin: 0.6 mg/dL (ref 0.3–1.2)
Total Protein: 6.4 g/dL (ref 6.0–8.3)

## 2013-01-21 MED ORDER — HEPARIN SOD (PORK) LOCK FLUSH 100 UNIT/ML IV SOLN
500.0000 [IU] | Freq: Once | INTRAVENOUS | Status: AC
  Administered 2013-01-21: 500 [IU] via INTRAVENOUS
  Filled 2013-01-21: qty 5

## 2013-01-21 MED ORDER — SODIUM CHLORIDE 0.9 % IV SOLN
INTRAVENOUS | Status: DC
  Administered 2013-01-21: 15:00:00 via INTRAVENOUS

## 2013-01-21 MED ORDER — ZOLEDRONIC ACID 4 MG/5ML IV CONC
2.0000 mg | Freq: Once | INTRAVENOUS | Status: AC
  Administered 2013-01-21: 2 mg via INTRAVENOUS
  Filled 2013-01-21: qty 2.5

## 2013-01-21 MED ORDER — HEPARIN SOD (PORK) LOCK FLUSH 100 UNIT/ML IV SOLN
INTRAVENOUS | Status: AC
  Filled 2013-01-21: qty 5

## 2013-01-21 NOTE — Progress Notes (Signed)
Pt wanted [place] to note in his chart that his brother had a contagious virus that causes severe diarrhea (Campylobacterosis).

## 2013-01-25 LAB — MULTIPLE MYELOMA PANEL, SERUM
Albumin ELP: 61.9 % (ref 55.8–66.1)
Alpha-1-Globulin: 6.3 % — ABNORMAL HIGH (ref 2.9–4.9)
Alpha-2-Globulin: 11.8 % (ref 7.1–11.8)
Beta 2: 5.4 % (ref 3.2–6.5)
Beta Globulin: 6.8 % (ref 4.7–7.2)
Gamma Globulin: 7.8 % — ABNORMAL LOW (ref 11.1–18.8)
IgA: 69 mg/dL (ref 68–379)
IgG (Immunoglobin G), Serum: 414 mg/dL — ABNORMAL LOW (ref 650–1600)
IgM, Serum: 62 mg/dL (ref 41–251)
M-Spike, %: NOT DETECTED g/dL
Total Protein: 5.9 g/dL — ABNORMAL LOW (ref 6.0–8.3)

## 2013-01-25 LAB — KAPPA/LAMBDA LIGHT CHAINS
Kappa free light chain: 1.94 mg/dL (ref 0.33–1.94)
Kappa, lambda light chain ratio: 1.23 (ref 0.26–1.65)
Lambda free light chains: 1.58 mg/dL (ref 0.57–2.63)

## 2013-01-27 ENCOUNTER — Other Ambulatory Visit (HOSPITAL_COMMUNITY): Payer: Self-pay | Admitting: Oncology

## 2013-01-27 ENCOUNTER — Encounter (HOSPITAL_COMMUNITY): Payer: Managed Care, Other (non HMO) | Admitting: Oncology

## 2013-01-27 DIAGNOSIS — C9000 Multiple myeloma not having achieved remission: Secondary | ICD-10-CM

## 2013-01-27 MED ORDER — LENALIDOMIDE 5 MG PO CAPS: ORAL_CAPSULE | ORAL | Status: DC

## 2013-01-27 NOTE — Progress Notes (Signed)
-  No show, letter sent-   

## 2013-02-10 ENCOUNTER — Telehealth (HOSPITAL_COMMUNITY): Payer: Self-pay | Admitting: Oncology

## 2013-02-10 NOTE — Telephone Encounter (Signed)
FAXED MED RECS TO [facility] [phone]) [phone])  [facility] [phone]

## 2013-02-18 ENCOUNTER — Ambulatory Visit (HOSPITAL_COMMUNITY): Payer: Managed Care, Other (non HMO)

## 2013-02-22 ENCOUNTER — Encounter (INDEPENDENT_AMBULATORY_CARE_PROVIDER_SITE_OTHER): Payer: Self-pay | Admitting: Internal Medicine

## 2013-02-22 ENCOUNTER — Ambulatory Visit (INDEPENDENT_AMBULATORY_CARE_PROVIDER_SITE_OTHER): Payer: Managed Care, Other (non HMO) | Admitting: Internal Medicine

## 2013-02-22 VITALS — BP 106/66 | HR 70 | Temp 97.9°F | Resp 18 | Ht 75.0 in | Wt 320.3 lb

## 2013-02-22 DIAGNOSIS — R7401 Elevation of levels of liver transaminase levels: Secondary | ICD-10-CM

## 2013-02-22 DIAGNOSIS — R7402 Elevation of levels of lactic acid dehydrogenase (LDH): Secondary | ICD-10-CM

## 2013-02-22 DIAGNOSIS — K903 Pancreatic steatorrhea: Secondary | ICD-10-CM

## 2013-02-22 NOTE — Patient Instructions (Signed)
Take Lomotil or diphenoxylate 2 tablets before breakfast and 2 before lunch daily. Call with progress report in 2-3 weeks. Test for hepatitis B and hepatitis C to be done when you have your next blood draw at oncology clinic

## 2013-02-22 NOTE — Progress Notes (Signed)
Presenting complaint;  Followup for pancreatic steatorrhea.  Subjective:  Patient is 52 year old Caucasian male who presents for 6 month follow-up. He feels about the same. He rarely has formed stools. On his best days he has 2 semi-formed stools. On his worst days he may have as many as 5 stools per day. He has at least 3-4 episodes of explosive bowel movement every week. He has lost 20 pounds since his last visit. He is getting on a treadmill 3-4 times a week and walks 5-10 minutes. He denies melena or rectal bleeding nausea or vomiting. He has good appetite. He has upper abdominal pain on days when he has diarrhea and explosive bowel movements. About 3 months ago he was noted to have mildly elevated transaminases. He abdominopelvic CT in July,2014 revealing fatty liver. He remains on maintenance therapy for multiple myeloma with Decadron and [name]. He is trying to keep diuretic use to minimum.   Current Medications: Current Outpatient Prescriptions  Medication Sig Dispense Refill  . acyclovir (ZOVIRAX) 400 MG tablet Take 1 tablet (400 mg total) by mouth every morning.  30 tablet  3  . allopurinol (ZYLOPRIM) 300 MG tablet Take 1 tablet (300 mg total) by mouth daily.  30 tablet  3  . atorvastatin (LIPITOR) 80 MG tablet Take 80 mg by mouth at bedtime.      . calcium-vitamin D (OSCAL WITH D) 500-200 MG-UNIT per tablet Take 1 tablet by mouth daily with breakfast.      . carvedilol (COREG) 3.125 MG tablet Take 1 tablet (3.125 mg total) by mouth 2 (two) times daily with a meal.  60 tablet  6  . Cholecalciferol (VITAMIN D) 2000 UNITS CAPS Take 1 capsule by mouth every morning.       . ciprofloxacin (CIPRO) 500 MG tablet Take 500 mg by mouth 2 (two) times daily.       Marland Kitchen dexamethasone (DECADRON) 4 MG tablet Take 10 tablets (40 mg total) by mouth once a week. Takes 5 tabs bid every Friday.  40 tablet  5  . dexlansoprazole (DEXILANT) 60 MG capsule Take 60 mg by mouth every morning.       .  diphenoxylate-atropine (LOMOTIL) 2.5-0.025 MG per tablet Take 2 tablets by mouth every morning. Max 8 per day  60 tablet  2  . HYDROcodone-acetaminophen (NORCO/VICODIN) 5-325 MG per tablet Take 1 tablet by mouth every 4 (four) hours as needed for pain.       Marland Kitchen insulin aspart (NOVOLOG) 100 UNIT/ML injection Inject 5-20 Units into the skin 3 (three) times daily before meals. Sliding scale      . insulin detemir (LEVEMIR) 100 UNIT/ML injection Inject 40 Units into the skin every morning.       Marland Kitchen lenalidomide ([name]) 5 MG capsule Take 1 tablet PO 7 days on and 7 days off.  14 capsule  0  . lidocaine-prilocaine (EMLA) cream Apply 1 application topically as needed (for port-a-cath access).       Marland Kitchen loperamide (IMODIUM) 2 MG capsule Take 1 capsule (2 mg total) by mouth 4 (four) times daily as needed for diarrhea or loose stools.  30 capsule  0  . loratadine (CLARITIN) 10 MG tablet Take 10 mg by mouth every morning.       . Multiple Vitamins-Minerals (MULTIVITAMINS THER. W/MINERALS) TABS Take 1 tablet by mouth every morning.       . nitroGLYCERIN (NITROSTAT) 0.4 MG SL tablet Place 1 tablet (0.4 mg total) under the tongue every 5 (five)  minutes as needed for chest pain.  25 tablet  3  . ondansetron (ZOFRAN) 8 MG tablet Take 1 tablet (8 mg total) by mouth every 4 (four) hours as needed for nausea.  15 tablet  0  . oxyCODONE-acetaminophen (PERCOCET) 5-325 MG per tablet Take 2 tablets by mouth every 4 (four) hours as needed for pain.  30 tablet  0  . Pancrelipase, Lip-Prot-Amyl, 36000 UNITS CPEP Take 36,000-108,000 Units by mouth 3 (three) times daily. 3 caps with meals, 1 cap with snacks      . potassium chloride SA (K-DUR,KLOR-CON) 20 MEQ tablet Take 40 mEq by mouth 2 (two) times daily as needed (taken while taking Torsemide).       . Rivaroxaban (XARELTO) 20 MG TABS tablet Take 1 tablet (20 mg total) by mouth at bedtime.  90 tablet  0  . sulfamethoxazole-trimethoprim (BACTRIM DS) 800-160 MG per tablet Take 1  tablet by mouth every Monday, Wednesday, and Friday.  12 tablet  5  . torsemide (DEMADEX) 20 MG tablet Take 20 mg by mouth daily as needed (for fluid retention).       . Zoledronic Acid (ZOMETA IV) Inject 2 mg into the vein every 28 (twenty-eight) days.        No current facility-administered medications for this visit.     Objective: Blood pressure 106/66, pulse 70, temperature 97.9 F (36.6 C), temperature source Oral, resp. rate 18, height 6\' 3"  (1.905 m), weight 320 lb 4.8 oz (145.287 kg). Patient is alert and in no acute distress. Conjunctiva is pink. Sclera is nonicteric Oropharyngeal mucosa is normal. No neck masses or thyromegaly noted. Cardiac exam with regular rhythm normal S1 and S2. No murmur or gallop noted. Lungs are clear to auscultation. Abdomen is obese with left subcostal scar(site of previous lap band). Bowel sounds are normal. Abdomen is soft and nontender without organomegaly or masses.  He has 1+ pitting edema around his ankles.  Labs/studies Results: LFTs from 01/21/2013. Bilirubin 0.6, AP 98, AST 52, ALT 96 and albumin 3.6   Assessment:  #1. Pancreatic steatorrhea. He is still not having normal bowel movements. If he does not respond to higher doses of diphenoxylate will consider bumping up pancreatic enzyme dose as he is not on maximal dose based on his weight. #2. Mildly elevated transaminases. This abnormalities possibly due to fatty liver and her medications. Need to check for hepatitis B and C.    Plan:  Increase diphenoxylate 2 tablets before breakfast and 2 before lunch. Continue pancreatic enzyme supplement at current dose which is 3 capsules with each meal and one with snack. Progress report in 2-3 weeks. Hepatitis B surface antigen and hepatitis C virus antibody with next blood work. Office visit in 6 months.

## 2013-02-26 ENCOUNTER — Other Ambulatory Visit: Payer: Self-pay | Admitting: Cardiology

## 2013-03-03 ENCOUNTER — Encounter (HOSPITAL_COMMUNITY): Payer: Self-pay | Admitting: Oncology

## 2013-03-04 ENCOUNTER — Other Ambulatory Visit (INDEPENDENT_AMBULATORY_CARE_PROVIDER_SITE_OTHER): Payer: Self-pay | Admitting: Internal Medicine

## 2013-03-04 DIAGNOSIS — R197 Diarrhea, unspecified: Secondary | ICD-10-CM

## 2013-03-04 MED ORDER — DIPHENOXYLATE-ATROPINE 2.5-0.025 MG PO TABS: 2.0000 | ORAL_TABLET | Freq: Every morning | ORAL | Status: DC

## 2013-03-04 NOTE — Telephone Encounter (Signed)
Lomotil eprescribed

## 2013-03-08 ENCOUNTER — Other Ambulatory Visit (HOSPITAL_COMMUNITY): Payer: Self-pay | Admitting: Oncology

## 2013-03-08 DIAGNOSIS — R197 Diarrhea, unspecified: Secondary | ICD-10-CM

## 2013-03-14 ENCOUNTER — Other Ambulatory Visit (HOSPITAL_COMMUNITY): Payer: Self-pay | Admitting: Oncology

## 2013-03-14 DIAGNOSIS — C9000 Multiple myeloma not having achieved remission: Secondary | ICD-10-CM

## 2013-03-14 MED ORDER — LENALIDOMIDE 5 MG PO CAPS: ORAL_CAPSULE | ORAL | Status: DC

## 2013-03-18 ENCOUNTER — Other Ambulatory Visit (HOSPITAL_COMMUNITY): Payer: Self-pay | Admitting: Oncology

## 2013-03-18 ENCOUNTER — Encounter (HOSPITAL_COMMUNITY): Payer: Managed Care, Other (non HMO) | Attending: Oncology

## 2013-03-18 VITALS — BP 120/75 | HR 69 | Temp 98.0°F | Resp 18

## 2013-03-18 DIAGNOSIS — C9000 Multiple myeloma not having achieved remission: Secondary | ICD-10-CM

## 2013-03-18 DIAGNOSIS — X58XXXA Exposure to other specified factors, initial encounter: Secondary | ICD-10-CM | POA: Insufficient documentation

## 2013-03-18 DIAGNOSIS — N453 Epididymo-orchitis: Secondary | ICD-10-CM | POA: Insufficient documentation

## 2013-03-18 DIAGNOSIS — IMO0002 Reserved for concepts with insufficient information to code with codable children: Secondary | ICD-10-CM | POA: Insufficient documentation

## 2013-03-18 LAB — CBC WITH DIFFERENTIAL/PLATELET
Basophils Absolute: 0 10*3/uL (ref 0.0–0.1)
Basophils Relative: 0 % (ref 0–1)
Eosinophils Absolute: 0.1 10*3/uL (ref 0.0–0.7)
Eosinophils Relative: 1 % (ref 0–5)
HCT: 33.7 % — ABNORMAL LOW (ref 39.0–52.0)
Hemoglobin: 11.1 g/dL — ABNORMAL LOW (ref 13.0–17.0)
Lymphocytes Relative: 10 % — ABNORMAL LOW (ref 12–46)
Lymphs Abs: 0.6 10*3/uL — ABNORMAL LOW (ref 0.7–4.0)
MCH: 30.8 pg (ref 26.0–34.0)
MCHC: 32.9 g/dL (ref 30.0–36.0)
MCV: 93.6 fL (ref 78.0–100.0)
Monocytes Absolute: 0.3 10*3/uL (ref 0.1–1.0)
Monocytes Relative: 5 % (ref 3–12)
Neutro Abs: 5.1 10*3/uL (ref 1.7–7.7)
Neutrophils Relative %: 84 % — ABNORMAL HIGH (ref 43–77)
Platelets: 143 10*3/uL — ABNORMAL LOW (ref 150–400)
RBC: 3.6 MIL/uL — ABNORMAL LOW (ref 4.22–5.81)
RDW: 15.7 % — ABNORMAL HIGH (ref 11.5–15.5)
WBC: 6.1 10*3/uL (ref 4.0–10.5)

## 2013-03-18 LAB — COMPREHENSIVE METABOLIC PANEL
ALT: 44 U/L (ref 0–53)
AST: 29 U/L (ref 0–37)
Albumin: 3.4 g/dL — ABNORMAL LOW (ref 3.5–5.2)
Alkaline Phosphatase: 81 U/L (ref 39–117)
BUN: 26 mg/dL — ABNORMAL HIGH (ref 6–23)
CO2: 21 mEq/L (ref 19–32)
Calcium: 8.9 mg/dL (ref 8.4–10.5)
Chloride: 107 mEq/L (ref 96–112)
Creatinine, Ser: 1.5 mg/dL — ABNORMAL HIGH (ref 0.50–1.35)
GFR calc Af Amer: 59 mL/min — ABNORMAL LOW (ref >90)
GFR calc non Af Amer: 51 mL/min — ABNORMAL LOW (ref >90)
Glucose, Bld: 228 mg/dL — ABNORMAL HIGH (ref 70–99)
Potassium: 3.3 mEq/L — ABNORMAL LOW (ref 3.5–5.1)
Sodium: 139 mEq/L (ref 135–145)
Total Bilirubin: 0.6 mg/dL (ref 0.3–1.2)
Total Protein: 5.9 g/dL — ABNORMAL LOW (ref 6.0–8.3)

## 2013-03-18 MED ORDER — HEPARIN SOD (PORK) LOCK FLUSH 100 UNIT/ML IV SOLN
INTRAVENOUS | Status: AC
  Filled 2013-03-18: qty 5

## 2013-03-18 MED ORDER — ZOLEDRONIC ACID 4 MG/5ML IV CONC
2.0000 mg | Freq: Once | INTRAVENOUS | Status: AC
  Administered 2013-03-18: 2 mg via INTRAVENOUS
  Filled 2013-03-18: qty 2.5

## 2013-03-18 MED ORDER — POTASSIUM CHLORIDE CRYS ER 20 MEQ PO TBCR
40.0000 meq | EXTENDED_RELEASE_TABLET | Freq: Two times a day (BID) | ORAL | Status: DC | PRN
Start: 2013-03-18 — End: 2013-04-11

## 2013-03-18 NOTE — Progress Notes (Signed)
Tolerated Zometa 2 mg well.

## 2013-03-19 LAB — HEPATITIS B SURFACE ANTIGEN: Hepatitis B Surface Ag: NEGATIVE

## 2013-03-19 LAB — HEPATITIS C ANTIBODY: HCV Ab: NEGATIVE

## 2013-03-21 ENCOUNTER — Ambulatory Visit (HOSPITAL_COMMUNITY): Payer: Managed Care, Other (non HMO) | Admitting: Oncology

## 2013-03-21 LAB — KAPPA/LAMBDA LIGHT CHAINS
Kappa free light chain: 2.23 mg/dL — ABNORMAL HIGH (ref 0.33–1.94)
Kappa, lambda light chain ratio: 1.12 (ref 0.26–1.65)
Lambda free light chains: 1.99 mg/dL (ref 0.57–2.63)

## 2013-03-22 LAB — MULTIPLE MYELOMA PANEL, SERUM
Albumin ELP: 62.2 % (ref 55.8–66.1)
Alpha-1-Globulin: 6.9 % — ABNORMAL HIGH (ref 2.9–4.9)
Alpha-2-Globulin: 12.1 % — ABNORMAL HIGH (ref 7.1–11.8)
Beta 2: 5.1 % (ref 3.2–6.5)
Beta Globulin: 6.6 % (ref 4.7–7.2)
Gamma Globulin: 7.1 % — ABNORMAL LOW (ref 11.1–18.8)
IgA: 60 mg/dL — ABNORMAL LOW (ref 68–379)
IgG (Immunoglobin G), Serum: 368 mg/dL — ABNORMAL LOW (ref 650–1600)
IgM, Serum: 47 mg/dL — ABNORMAL LOW (ref 41–251)
M-Spike, %: NOT DETECTED g/dL
Total Protein: 5.2 g/dL — ABNORMAL LOW (ref 6.0–8.3)

## 2013-03-23 ENCOUNTER — Other Ambulatory Visit (INDEPENDENT_AMBULATORY_CARE_PROVIDER_SITE_OTHER): Payer: Self-pay | Admitting: *Deleted

## 2013-03-23 MED ORDER — PANCRELIPASE (LIP-PROT-AMYL) 36000-114000 UNITS PO CPEP: 36000.0000 [IU] | ORAL_CAPSULE | Freq: Three times a day (TID) | ORAL | Status: DC

## 2013-03-23 NOTE — Telephone Encounter (Signed)
Forwarded to Dr.Rehman to prescribe.

## 2013-03-23 NOTE — Telephone Encounter (Signed)
We rec'd a call from the patient. He will be unable to get the Creon until Monday per his pharmacy. I called the [facility]/Scott, he had been told by the insurance company that it was to early and that we could do a PA then a override could be done. I called insurance company/Matt, we did PA over the phone, he provided the following reference number [id number] and ask if this should be stat. I said yes as the patient needs this medication ,he is out and we have no samples at this time. He marked it stat and says that the standard turn around time for a stat is 72 hours. Patient was called and made aware.

## 2013-03-27 NOTE — Progress Notes (Signed)
[name], MD [name] [address] [name] [address]  Multiple myeloma - Plan: Immune Globulin 5% (OCTAGAM) SOLN 20 g, Immune Globulin 5% (OCTAGAM) SOLN 20 g, Immune Globulin 5% (OCTAGAM) SOLN 20 g, ciprofloxacin (CIPRO) 500 MG tablet  Paronychia, left - Plan: Immune Globulin 5% (OCTAGAM) SOLN 20 g, Immune Globulin 5% (OCTAGAM) SOLN 20 g, Immune Globulin 5% (OCTAGAM) SOLN 20 g, ciprofloxacin (CIPRO) 500 MG tablet  Epididymitis - Plan: Immune Globulin 5% (OCTAGAM) SOLN 20 g, Immune Globulin 5% (OCTAGAM) SOLN 20 g, Immune Globulin 5% (OCTAGAM) SOLN 20 g, ciprofloxacin (CIPRO) 500 MG tablet  Hypogammaglobulinaemia, unspecified  CURRENT THERAPY: Lenalidomide 5 mg 7 days on and 7 days off with 20 mg Dexamethasone BID every Friday only. Zometa every 4 weeks.   INTERVAL HISTORY: [name] 52 y.o. male returns for  regular  visit for followup of kappa light chain multiple myeloma, stage II disease presently on maintenance [name] 5 mg daily 7 days on, 7 days off along with weekly dexamethasone on Friday of every week. Zometa every 4 weeks.   [name] recently had issues with his port requiring IR referral for port placement.  This was performed on 12/10/2012.  [name] missed his follow-up appointment on 01/27/2013 and now, three months later is back for follow-up appointment.  Since our last encounter, [name] has develepoed a recurrence of orchitis which he was placed on Cipro 500 mg BID for by his PCP.  He reports that the discomfort is moderately improved, but has not yet resolved.  He has 1 more day worth of antibiotics.  Additionally, he has develoepd a left finger paronychia that has required surgical intervention with drainage.  He continues to follow-up with Dr. Dwyane Dee and therapist for this.    He denies any fevers or chills.   In the past, [name] has required IVIg infusions for infections.  Immunoglobulins from 10 days ago reveal stable low IvIg and IvIa levels.   With this infection, we will administer IVIG 20 grams daily x 3 days and extend his Cipro antibiotic x 7 days.   He otherwise denies any complaints.  He admits to [name] compliance.  He also has been compliant with Zometa as well.    I personally reviewed and went over laboratory results with the patient.  His MM panel is stable and he remains in a remission from a MM standpoint.  His Hgb is stable at 11.1 g/dL, with a minimal thrombocytopenia at 143, and a WBC WNL. He also displays a mild hypokalemia which is treated with Kdur, stable renal insufficiency, and hyperglycemia.    Hematologically, ROS questioning is negative.   Past Medical History  Diagnosis Date  . Arteriosclerotic cardiovascular disease (ASCVD)     MI-2000s; stent to the proximal LAD and diagonal in 2001; stress nuclear in 2008-impaired exercise capacity, left ventricular dilatation, moderately to severely depressed EF, apical, inferior and anteroseptal scar  . Hypertension   . 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 Diabetes mellitus     Insulin  . GERD (gastroesophageal reflux disease)   . Pedal edema     Venous insufficiency  . Obesity   . Hyperlipidemia   . Injection site reaction   . Cellulitis of leg     both legs  . Chronic kidney disease, stage 3, mod decreased GFR     Creatinine of 1.84 in 06/2011 and 1.5 in 07/2011  . Chronic diarrhea   . Sleep apnea     uses cpap  .  Ulcer   . Atrial flutter   . Multiple myeloma 07/01/2011  . Myocardial infarction 2000  . Gout     has DIABETES MELLITUS, TYPE II, ON INSULIN; Morbid obesity; SLEEP APNEA; Multiple myeloma; Arteriosclerotic cardiovascular disease (ASCVD); Hypertension; Hyperlipidemia; DDD (degenerative disc disease), cervical; Atrial flutter; Chronic pancreatitis; and Anemia, normocytic normochromic on his problem list.     has No Known Allergies.  [name] had no medications administered during this visit.  Past Surgical History  Procedure Laterality  Date  . Laparoscopic gastric banding  2006    has been removed  . Wrist surgery      Left; removal of bone fragment  . Incision and drainage abscess anal    . Abscess drainage      Scrotal  . Bone marrow biopsy  05/13/11  . Portacath placement  07/07/2011    Procedure: INSERTION PORT-A-CATH;  Surgeon: Scherry Ran, MD;  Location: [facility];  Service: General;  Laterality: N/A;  . Colonoscopy  11/28/2011    Procedure: COLONOSCOPY;  Surgeon: Rogene Houston, MD;  Location: [facility];  Service: Endoscopy;  Laterality: N/A;  930  . Esophagogastroduodenoscopy  01/02/2012    Procedure: ESOPHAGOGASTRODUODENOSCOPY (EGD);  Surgeon: Rogene Houston, MD;  Location: [facility];  Service: Endoscopy;  Laterality: N/A;  100  . Esophageal biopsy  01/02/2012    Procedure: BIOPSY;  Surgeon: Rogene Houston, MD;  Location: [facility];  Service: Endoscopy;  Laterality: N/A;  . Esophagogastroduodenoscopy N/A 09/20/2012    Procedure: ESOPHAGOGASTRODUODENOSCOPY (EGD);  Surgeon: Rogene Houston, MD;  Location: [facility];  Service: Endoscopy;  Laterality: N/A;  . Eus N/A 10/07/2012    Procedure: UPPER ENDOSCOPIC ULTRASOUND (EUS) LINEAR;  Surgeon: Milus Banister, MD;  Location: [facility];  Service: Endoscopy;  Laterality: N/A;  . Cardioversion N/A 10/13/2012    Procedure: CARDIOVERSION;  Surgeon: Yehuda Savannah, MD;  Location: [facility];  Service: Cardiovascular;  Laterality: N/A;  . Cardiac catheterization      cardiac stent  . Port-a-cath removal Left 12/07/2012    Procedure: REMOVAL PORT-A-CATH;  Surgeon: Scherry Ran, MD;  Location: [facility];  Service: General;  Laterality: Left;  . Portacath placement N/A 12/07/2012    Procedure: INSERTION PORT-A-CATH;  Surgeon: Scherry Ran, MD;  Location: [facility];  Service: General;  Laterality: N/A;  Attempted portacath placement on left and right side    Denies any headaches, dizziness, double vision, fevers, chills, night sweats, nausea, vomiting,  diarrhea, constipation, chest pain, heart palpitations, shortness of breath, blood in stool, black tarry stool, urinary pain, urinary burning, urinary frequency, hematuria.   PHYSICAL EXAMINATION  ECOG PERFORMANCE STATUS: 1 - Symptomatic but completely ambulatory  Filed Vitals:   03/28/13 1502  BP: 108/71  Pulse: 67  Temp: 97.5 F (36.4 C)  Resp: 20    GENERAL:alert, no distress, well nourished, well developed, comfortable, cooperative, obese and smiling SKIN: skin color, texture, turgor are normal, no rashes or significant lesions HEAD: Normocephalic, No masses, lesions, tenderness or abnormalities EYES: normal, PERRLA, EOMI, Conjunctiva are pink and non-injected EARS: External ears normal OROPHARYNX:mucous membranes are moist  NECK: supple, no adenopathy, thyroid normal size, non-tender, without nodularity, no stridor, non-tender, trachea midline LYMPH:  no palpable lymphadenopathy, no hepatosplenomegaly BREAST:not examined LUNGS: clear to auscultation and percussion HEART: regular rate & rhythm, no murmurs, no gallops, S1 normal and S2 normal ABDOMEN:abdomen soft, non-tender, obese and normal bowel sounds BACK: Back symmetric, no curvature., Range  of motion is normal EXTREMITIES:less then 2 second capillary refill, no skin discoloration, positive findings:  Left index finger nicely wrapped  NEURO: alert & oriented x 3 with fluent speech, no focal motor/sensory deficits, gait normal   LABORATORY DATA: CBC    Component Value Date/Time   WBC 6.1 03/18/2013 1430   RBC 3.60* 03/18/2013 1430   HGB 11.1* 03/18/2013 1430   HCT 33.7* 03/18/2013 1430   PLT 143* 03/18/2013 1430   MCV 93.6 03/18/2013 1430   MCH 30.8 03/18/2013 1430   MCHC 32.9 03/18/2013 1430   RDW 15.7* 03/18/2013 1430   LYMPHSABS 0.6* 03/18/2013 1430   MONOABS 0.3 03/18/2013 1430   EOSABS 0.1 03/18/2013 1430   BASOSABS 0.0 03/18/2013 1430      Chemistry      Component Value Date/Time   NA 139 03/18/2013  1430   K 3.3* 03/18/2013 1430   CL 107 03/18/2013 1430   CO2 21 03/18/2013 1430   BUN 26* 03/18/2013 1430   CREATININE 1.50* 03/18/2013 1430   CREATININE 2.24* 10/05/2012 1411      Component Value Date/Time   CALCIUM 8.9 03/18/2013 1430   ALKPHOS 81 03/18/2013 1430   AST 29 03/18/2013 1430   ALT 44 03/18/2013 1430   BILITOT 0.6 03/18/2013 1430     Results for MONTRAIL, RIDGELL (MRN [id number]) as of 03/27/2013 10:59  Ref. Range 03/18/2013 14:30  Albumin ELP Latest Range: 55.8-66.1 % 62.2  Alpha-1-Globulin Latest Range: 2.9-4.9 % 6.9 (H)  Alpha-2-Globulin Latest Range: 7.1-11.8 % 12.1 (H)  Beta Globulin Latest Range: 4.7-7.2 % 6.6  Beta 2 Latest Range: 3.2-6.5 % 5.1  Gamma Globulin Latest Range: 11.1-18.8 % 7.1 (L)  M-SPIKE, % No range found NOT DETECTED  SPE Interp. No range found (NOTE)  Comment No range found (NOTE)  IgG (Immunoglobin G), Serum Latest Range: [phone] mg/dL 368 (L)  IgA Latest Range: 68-379 mg/dL 60 (L)  IgM, Serum Latest Range: 41-251 mg/dL 47 (L)  Kappa free light chain Latest Range: 0.33-1.94 mg/dL 2.23 (H)  Lamda free light chains Latest Range: 0.57-2.63 mg/dL 1.99  Kappa, lamda light chain ratio Latest Range: 0.26-1.65  1.12      ASSESSMENT:  1. Kappa light chain multiple myeloma, stage II disease presently on maintenance [name] 5 mg daily 7 days on, 7 days off along with weekly dexamethasone on Friday of every week. Zometa every 4 weeks.  2. Gastric ulcer, likely cause of epigastric abdominal pain, followed by GI.  3. Recent orchitis bilaterally status post multiple antibiotics, finally improved with IVIG  4. Diabetes mellitus  5. High-volume diarrhea followed by Dr. Laural Golden, suspected to be secondary to pancreatic steatorrhea. 6. Recurrent orchitis, on Cipro with moderate improvement. Will give IVIG. 7. Left finer paronychia, on Cipro.  Will give IVIG. 8. Hypogammaglobulinemia  Patient Active Problem List   Diagnosis Date Noted  . Anemia,  normocytic normochromic 10/09/2012  . Chronic pancreatitis 10/07/2012  . Atrial flutter 09/17/2012  . DDD (degenerative disc disease), cervical 03/18/2012  . Arteriosclerotic cardiovascular disease (ASCVD)   . Hypertension   . Hyperlipidemia   . Multiple myeloma 07/01/2011  . Morbid obesity 04/29/2010  . DIABETES MELLITUS, TYPE II, ON INSULIN 11/15/2008  . SLEEP APNEA 11/15/2008     PLAN:  1. I personally reviewed and went over laboratory results with the patient. 2. Continue Zometa every 4 weeks.  3. Continue Lenalidomide 5 mg, 7 days on and 7 days off with 20 mg Dexamethasone BID every Friday only.  4. Labs every 4 weeks: CBC diff, CMET, MM panel 5. Will extend Cipro 500 mg BID x 7 more days in light of addition of IVIG. 6. IVIG 20 grams x 3 days 7. Return in 8 weeks for follow-up.   THERAPY PLAN:  From a MM standpoint he remains in remission and will continue with his maintenance regimen described above.  However, with his recent infections, we will extend his Cipro dose for an additional 7 days and administer IVIG 20 grams daily x 3 days this week.   All questions were answered. The patient knows to call the clinic with any problems, questions or concerns. We can certainly see the patient much sooner if necessary.  Patient and plan discussed with Dr. Farrel Gobble and he is in agreement with the aforementioned.   More than 50% of the time spent with the patient was utilized for counseling and coordination of care.   Paulo Keimig

## 2013-03-28 ENCOUNTER — Encounter: Payer: Self-pay | Admitting: Oncology

## 2013-03-28 ENCOUNTER — Encounter (HOSPITAL_BASED_OUTPATIENT_CLINIC_OR_DEPARTMENT_OTHER): Payer: Managed Care, Other (non HMO) | Admitting: Oncology

## 2013-03-28 VITALS — BP 108/71 | HR 67 | Temp 97.5°F | Resp 20 | Wt 326.0 lb

## 2013-03-28 DIAGNOSIS — C9000 Multiple myeloma not having achieved remission: Secondary | ICD-10-CM

## 2013-03-28 DIAGNOSIS — L03012 Cellulitis of left finger: Secondary | ICD-10-CM

## 2013-03-28 DIAGNOSIS — D801 Nonfamilial hypogammaglobulinemia: Secondary | ICD-10-CM

## 2013-03-28 DIAGNOSIS — N451 Epididymitis: Secondary | ICD-10-CM

## 2013-03-28 DIAGNOSIS — N453 Epididymo-orchitis: Secondary | ICD-10-CM

## 2013-03-28 DIAGNOSIS — IMO0002 Reserved for concepts with insufficient information to code with codable children: Secondary | ICD-10-CM

## 2013-03-28 MED ORDER — IMMUNE GLOBULIN (HUMAN) 10 GM/200ML IV SOLN: 20.0000 g | Freq: Once | INTRAVENOUS | Status: DC

## 2013-03-28 MED ORDER — CIPROFLOXACIN HCL 500 MG PO TABS: 500.0000 mg | ORAL_TABLET | Freq: Two times a day (BID) | ORAL | Status: DC

## 2013-03-28 NOTE — Patient Instructions (Signed)
.  [facility] Discharge Instructions  RECOMMENDATIONS MADE BY THE CONSULTANT AND ANY TEST RESULTS WILL BE SENT TO YOUR REFERRING PHYSICIAN.  EXAM FINDINGS BY THE PHYSICIAN TODAY AND SIGNS OR SYMPTOMS TO REPORT TO CLINIC OR PRIMARY PHYSICIAN: Exam and findings as discussed by T. Sheldon Silvan PA.  MEDICATIONS PRESCRIBED:  cipro total of 7-10 days INSTRUCTIONS/FOLLOW-UP: 8 weeks  Thank you for choosing [facility] to provide your oncology and hematology care.  To afford each patient quality time with our providers, please arrive at least 15 minutes before your scheduled appointment time.  With your help, our goal is to use those 15 minutes to complete the necessary work-up to ensure our physicians have the information they need to help with your evaluation and healthcare recommendations.    Effective January 1st, 2014, we ask that you re-schedule your appointment with our physicians should you arrive 10 or more minutes late for your appointment.  We strive to give you quality time with our providers, and arriving late affects you and other patients whose appointments are after yours.    Again, thank you for choosing [facility].  Our hope is that these requests will decrease the amount of time that you wait before being seen by our physicians.       _____________________________________________________________  Should you have questions after your visit to [facility], please contact our office at [phone] between the hours of 8:30 a.m. and 5:00 p.m.  Voicemails left after 4:30 p.m. will not be returned until the following business day.  For prescription refill requests, have your pharmacy contact our office with your prescription refill request.

## 2013-03-29 ENCOUNTER — Ambulatory Visit (HOSPITAL_COMMUNITY): Payer: Managed Care, Other (non HMO)

## 2013-03-29 ENCOUNTER — Telehealth (HOSPITAL_COMMUNITY): Payer: Self-pay | Admitting: Oncology

## 2013-03-29 NOTE — Telephone Encounter (Signed)
PC to [place] [phone] spoke with Peg and was advised that fax was recvd and is in review. May take 72-105hrs before Josem Kaufmann is  given.

## 2013-03-30 ENCOUNTER — Ambulatory Visit (HOSPITAL_COMMUNITY): Payer: Managed Care, Other (non HMO)

## 2013-03-31 ENCOUNTER — Ambulatory Visit (HOSPITAL_COMMUNITY): Payer: Managed Care, Other (non HMO)

## 2013-04-01 ENCOUNTER — Ambulatory Visit (HOSPITAL_COMMUNITY): Payer: Managed Care, Other (non HMO)

## 2013-04-05 ENCOUNTER — Other Ambulatory Visit (HOSPITAL_COMMUNITY): Payer: Self-pay | Admitting: Oncology

## 2013-04-05 ENCOUNTER — Telehealth (INDEPENDENT_AMBULATORY_CARE_PROVIDER_SITE_OTHER): Payer: Self-pay | Admitting: *Deleted

## 2013-04-05 DIAGNOSIS — N451 Epididymitis: Secondary | ICD-10-CM

## 2013-04-05 DIAGNOSIS — C9000 Multiple myeloma not having achieved remission: Secondary | ICD-10-CM

## 2013-04-05 NOTE — Telephone Encounter (Signed)
Noted. I have still not seen anything from the additional paper work Dr.Rehman completed and it was faxed to his [employer], the last week in October 2014.

## 2013-04-05 NOTE — Telephone Encounter (Signed)
[name] called on 04/01/13 to advised our office he was completely out of his Creon. He said his insurance would cover his old Rx. Dr. Laural Golden called in a his old Rx, not the increase dose.

## 2013-04-06 ENCOUNTER — Ambulatory Visit (HOSPITAL_COMMUNITY): Payer: Managed Care, Other (non HMO)

## 2013-04-07 ENCOUNTER — Other Ambulatory Visit: Payer: Self-pay

## 2013-04-07 ENCOUNTER — Ambulatory Visit (HOSPITAL_COMMUNITY): Payer: Managed Care, Other (non HMO)

## 2013-04-08 ENCOUNTER — Ambulatory Visit (HOSPITAL_COMMUNITY): Payer: Managed Care, Other (non HMO)

## 2013-04-08 ENCOUNTER — Other Ambulatory Visit (HOSPITAL_COMMUNITY): Payer: Self-pay | Admitting: Oncology

## 2013-04-08 DIAGNOSIS — C9000 Multiple myeloma not having achieved remission: Secondary | ICD-10-CM

## 2013-04-08 MED ORDER — LENALIDOMIDE 5 MG PO CAPS: ORAL_CAPSULE | ORAL | Status: DC

## 2013-04-11 ENCOUNTER — Telehealth (HOSPITAL_COMMUNITY): Payer: Self-pay | Admitting: *Deleted

## 2013-04-11 ENCOUNTER — Other Ambulatory Visit (HOSPITAL_COMMUNITY): Payer: Self-pay | Admitting: Oncology

## 2013-04-11 ENCOUNTER — Encounter (HOSPITAL_COMMUNITY): Payer: Managed Care, Other (non HMO) | Attending: Oncology

## 2013-04-11 VITALS — BP 130/70 | HR 53 | Temp 97.5°F | Resp 18 | Wt 325.0 lb

## 2013-04-11 DIAGNOSIS — E876 Hypokalemia: Secondary | ICD-10-CM

## 2013-04-11 DIAGNOSIS — N451 Epididymitis: Secondary | ICD-10-CM

## 2013-04-11 DIAGNOSIS — N453 Epididymo-orchitis: Secondary | ICD-10-CM | POA: Insufficient documentation

## 2013-04-11 DIAGNOSIS — X58XXXA Exposure to other specified factors, initial encounter: Secondary | ICD-10-CM | POA: Insufficient documentation

## 2013-04-11 DIAGNOSIS — C9000 Multiple myeloma not having achieved remission: Secondary | ICD-10-CM | POA: Insufficient documentation

## 2013-04-11 LAB — COMPREHENSIVE METABOLIC PANEL
ALT: 39 U/L (ref 0–53)
AST: 30 U/L (ref 0–37)
Albumin: 3.2 g/dL — ABNORMAL LOW (ref 3.5–5.2)
Alkaline Phosphatase: 78 U/L (ref 39–117)
BUN: 27 mg/dL — ABNORMAL HIGH (ref 6–23)
CO2: 20 mEq/L (ref 19–32)
Calcium: 8 mg/dL — ABNORMAL LOW (ref 8.4–10.5)
Chloride: 111 mEq/L (ref 96–112)
Creatinine, Ser: 1.44 mg/dL — ABNORMAL HIGH (ref 0.50–1.35)
GFR calc Af Amer: 62 mL/min — ABNORMAL LOW (ref >90)
GFR calc non Af Amer: 54 mL/min — ABNORMAL LOW (ref >90)
Glucose, Bld: 172 mg/dL — ABNORMAL HIGH (ref 70–99)
Potassium: 2.9 mEq/L — ABNORMAL LOW (ref 3.5–5.1)
Sodium: 141 mEq/L (ref 135–145)
Total Bilirubin: 0.6 mg/dL (ref 0.3–1.2)
Total Protein: 5.6 g/dL — ABNORMAL LOW (ref 6.0–8.3)

## 2013-04-11 LAB — CBC WITH DIFFERENTIAL/PLATELET
Basophils Absolute: 0 10*3/uL (ref 0.0–0.1)
Basophils Relative: 0 % (ref 0–1)
Eosinophils Absolute: 0.3 10*3/uL (ref 0.0–0.7)
Eosinophils Relative: 5 % (ref 0–5)
HCT: 32 % — ABNORMAL LOW (ref 39.0–52.0)
Hemoglobin: 10.6 g/dL — ABNORMAL LOW (ref 13.0–17.0)
Lymphocytes Relative: 31 % (ref 12–46)
Lymphs Abs: 2.1 10*3/uL (ref 0.7–4.0)
MCH: 30.9 pg (ref 26.0–34.0)
MCHC: 33.1 g/dL (ref 30.0–36.0)
MCV: 93.3 fL (ref 78.0–100.0)
Monocytes Absolute: 0.7 10*3/uL (ref 0.1–1.0)
Monocytes Relative: 10 % (ref 3–12)
Neutro Abs: 3.7 10*3/uL (ref 1.7–7.7)
Neutrophils Relative %: 55 % (ref 43–77)
Platelets: 150 10*3/uL (ref 150–400)
RBC: 3.43 MIL/uL — ABNORMAL LOW (ref 4.22–5.81)
RDW: 16.5 % — ABNORMAL HIGH (ref 11.5–15.5)
WBC: 6.8 10*3/uL (ref 4.0–10.5)

## 2013-04-11 MED ORDER — HEPARIN SOD (PORK) LOCK FLUSH 100 UNIT/ML IV SOLN
INTRAVENOUS | Status: AC
  Filled 2013-04-11: qty 5

## 2013-04-11 MED ORDER — DEXTROSE 5 % IV SOLN
INTRAVENOUS | Status: DC
  Administered 2013-04-11: 10:00:00 via INTRAVENOUS

## 2013-04-11 MED ORDER — POTASSIUM CHLORIDE CRYS ER 20 MEQ PO TBCR: 40.0000 meq | EXTENDED_RELEASE_TABLET | Freq: Every day | ORAL | Status: DC

## 2013-04-11 MED ORDER — HEPARIN SOD (PORK) LOCK FLUSH 100 UNIT/ML IV SOLN
500.0000 [IU] | Freq: Once | INTRAVENOUS | Status: AC
  Administered 2013-04-11: 500 [IU] via INTRAVENOUS

## 2013-04-11 MED ORDER — IMMUNE GLOBULIN (HUMAN) 10 GM/200ML IV SOLN
20.0000 g | Freq: Once | INTRAVENOUS | Status: AC
  Administered 2013-04-11: 20 g via INTRAVENOUS
  Filled 2013-04-11: qty 400

## 2013-04-11 MED ORDER — SODIUM CHLORIDE 0.9 % IJ SOLN
10.0000 mL | INTRAMUSCULAR | Status: DC | PRN
  Administered 2013-04-11: 10 mL via INTRAVENOUS

## 2013-04-11 NOTE — Telephone Encounter (Signed)
Pt notified that he needs to take KDUR 43meq daily until he runs out of tablets. He currently should have 69meq tablets at home. I communicated to him that he will need to take 2 tablets daily until he runs out of them which should take 15 days to do. He said ok and that he would take it.

## 2013-04-12 ENCOUNTER — Encounter (HOSPITAL_BASED_OUTPATIENT_CLINIC_OR_DEPARTMENT_OTHER): Payer: Managed Care, Other (non HMO)

## 2013-04-12 VITALS — BP 101/64 | HR 45 | Temp 97.6°F | Resp 18

## 2013-04-12 DIAGNOSIS — C9000 Multiple myeloma not having achieved remission: Secondary | ICD-10-CM

## 2013-04-12 DIAGNOSIS — D801 Nonfamilial hypogammaglobulinemia: Secondary | ICD-10-CM

## 2013-04-12 DIAGNOSIS — N451 Epididymitis: Secondary | ICD-10-CM

## 2013-04-12 LAB — KAPPA/LAMBDA LIGHT CHAINS
Kappa free light chain: 2.27 mg/dL — ABNORMAL HIGH (ref 0.33–1.94)
Kappa, lambda light chain ratio: 1.27 (ref 0.26–1.65)
Lambda free light chains: 1.79 mg/dL (ref 0.57–2.63)

## 2013-04-12 MED ORDER — IMMUNE GLOBULIN (HUMAN) 10 GM/200ML IV SOLN
10.0000 g | Freq: Once | INTRAVENOUS | Status: AC
  Administered 2013-04-12: 10 g via INTRAVENOUS
  Filled 2013-04-12: qty 200

## 2013-04-12 MED ORDER — DEXTROSE 5 % IV SOLN
INTRAVENOUS | Status: DC
  Administered 2013-04-12: 09:00:00 via INTRAVENOUS

## 2013-04-12 MED ORDER — IMMUNE GLOBULIN (HUMAN) 10 GM/200ML IV SOLN: 20.0000 g | Freq: Once | INTRAVENOUS | Status: DC

## 2013-04-12 MED ORDER — HEPARIN SOD (PORK) LOCK FLUSH 100 UNIT/ML IV SOLN
500.0000 [IU] | Freq: Once | INTRAVENOUS | Status: AC
  Administered 2013-04-12: 500 [IU] via INTRAVENOUS
  Filled 2013-04-12: qty 5

## 2013-04-12 MED ORDER — SODIUM CHLORIDE 0.9 % IJ SOLN
10.0000 mL | INTRAMUSCULAR | Status: DC | PRN
  Administered 2013-04-12: 10 mL via INTRAVENOUS

## 2013-04-13 ENCOUNTER — Encounter (HOSPITAL_BASED_OUTPATIENT_CLINIC_OR_DEPARTMENT_OTHER): Payer: Managed Care, Other (non HMO)

## 2013-04-13 VITALS — BP 102/54 | HR 50 | Temp 97.9°F | Resp 18

## 2013-04-13 DIAGNOSIS — D801 Nonfamilial hypogammaglobulinemia: Secondary | ICD-10-CM

## 2013-04-13 DIAGNOSIS — C9000 Multiple myeloma not having achieved remission: Secondary | ICD-10-CM

## 2013-04-13 DIAGNOSIS — N451 Epididymitis: Secondary | ICD-10-CM

## 2013-04-13 LAB — MULTIPLE MYELOMA PANEL, SERUM
Albumin ELP: 63 % (ref 55.8–66.1)
Alpha-1-Globulin: 6.3 % — ABNORMAL HIGH (ref 2.9–4.9)
Alpha-2-Globulin: 11.9 % — ABNORMAL HIGH (ref 7.1–11.8)
Beta 2: 4.6 % (ref 3.2–6.5)
Beta Globulin: 6.7 % (ref 4.7–7.2)
Gamma Globulin: 7.5 % — ABNORMAL LOW (ref 11.1–18.8)
IgA: 60 mg/dL — ABNORMAL LOW (ref 68–379)
IgG (Immunoglobin G), Serum: 370 mg/dL — ABNORMAL LOW (ref 650–1600)
IgM, Serum: 52 mg/dL — ABNORMAL LOW (ref 41–251)
M-Spike, %: NOT DETECTED g/dL
Total Protein: 5 g/dL — ABNORMAL LOW (ref 6.0–8.3)

## 2013-04-13 MED ORDER — SODIUM CHLORIDE 0.9 % IJ SOLN
10.0000 mL | INTRAMUSCULAR | Status: DC | PRN
  Administered 2013-04-13: 10 mL via INTRAVENOUS

## 2013-04-13 MED ORDER — ZOLEDRONIC ACID 4 MG/5ML IV CONC
2.0000 mg | Freq: Once | INTRAVENOUS | Status: AC
  Administered 2013-04-13: 2 mg via INTRAVENOUS
  Filled 2013-04-13: qty 2.5

## 2013-04-13 MED ORDER — HEPARIN SOD (PORK) LOCK FLUSH 100 UNIT/ML IV SOLN
500.0000 [IU] | Freq: Once | INTRAVENOUS | Status: AC
  Administered 2013-04-13: 500 [IU] via INTRAVENOUS
  Filled 2013-04-13: qty 5

## 2013-04-13 MED ORDER — SODIUM CHLORIDE 0.9 % IV SOLN: INTRAVENOUS | Status: DC

## 2013-04-13 MED ORDER — IMMUNE GLOBULIN (HUMAN) 10 GM/200ML IV SOLN
20.0000 g | Freq: Once | INTRAVENOUS | Status: AC
  Administered 2013-04-13: 20 g via INTRAVENOUS
  Filled 2013-04-13: qty 400

## 2013-04-13 MED ORDER — DEXTROSE 5 % IV SOLN
INTRAVENOUS | Status: DC
  Administered 2013-04-13: 09:00:00 via INTRAVENOUS

## 2013-04-15 ENCOUNTER — Ambulatory Visit (HOSPITAL_COMMUNITY): Payer: Managed Care, Other (non HMO)

## 2013-04-18 ENCOUNTER — Other Ambulatory Visit: Payer: Self-pay | Admitting: Cardiology

## 2013-04-26 ENCOUNTER — Other Ambulatory Visit (HOSPITAL_COMMUNITY): Payer: Self-pay | Admitting: Oncology

## 2013-04-26 DIAGNOSIS — C9000 Multiple myeloma not having achieved remission: Secondary | ICD-10-CM

## 2013-04-26 MED ORDER — ALLOPURINOL 300 MG PO TABS: 300.0000 mg | ORAL_TABLET | Freq: Every day | ORAL | Status: DC

## 2013-05-04 ENCOUNTER — Other Ambulatory Visit (INDEPENDENT_AMBULATORY_CARE_PROVIDER_SITE_OTHER): Payer: Self-pay | Admitting: Internal Medicine

## 2013-05-04 NOTE — Telephone Encounter (Signed)
error 

## 2013-05-13 ENCOUNTER — Ambulatory Visit (HOSPITAL_COMMUNITY): Payer: Managed Care, Other (non HMO)

## 2013-05-16 ENCOUNTER — Other Ambulatory Visit (HOSPITAL_COMMUNITY): Payer: Self-pay | Admitting: Oncology

## 2013-05-16 DIAGNOSIS — C9000 Multiple myeloma not having achieved remission: Secondary | ICD-10-CM

## 2013-05-16 MED ORDER — LENALIDOMIDE 5 MG PO CAPS: ORAL_CAPSULE | ORAL | Status: DC

## 2013-05-18 ENCOUNTER — Encounter (HOSPITAL_COMMUNITY): Payer: Managed Care, Other (non HMO) | Attending: Oncology

## 2013-05-18 DIAGNOSIS — C9000 Multiple myeloma not having achieved remission: Secondary | ICD-10-CM

## 2013-05-18 DIAGNOSIS — X58XXXA Exposure to other specified factors, initial encounter: Secondary | ICD-10-CM | POA: Insufficient documentation

## 2013-05-18 LAB — CBC WITH DIFFERENTIAL/PLATELET
Basophils Absolute: 0 10*3/uL (ref 0.0–0.1)
Basophils Relative: 0 % (ref 0–1)
Eosinophils Absolute: 0.2 10*3/uL (ref 0.0–0.7)
Eosinophils Relative: 4 % (ref 0–5)
HCT: 33.9 % — ABNORMAL LOW (ref 39.0–52.0)
Hemoglobin: 11.1 g/dL — ABNORMAL LOW (ref 13.0–17.0)
Lymphocytes Relative: 33 % (ref 12–46)
Lymphs Abs: 2 10*3/uL (ref 0.7–4.0)
MCH: 30.8 pg (ref 26.0–34.0)
MCHC: 32.7 g/dL (ref 30.0–36.0)
MCV: 94.2 fL (ref 78.0–100.0)
Monocytes Absolute: 1.1 10*3/uL — ABNORMAL HIGH (ref 0.1–1.0)
Monocytes Relative: 17 % — ABNORMAL HIGH (ref 3–12)
Neutro Abs: 2.9 10*3/uL (ref 1.7–7.7)
Neutrophils Relative %: 46 % (ref 43–77)
Platelets: 149 10*3/uL — ABNORMAL LOW (ref 150–400)
RBC: 3.6 MIL/uL — ABNORMAL LOW (ref 4.22–5.81)
RDW: 16.2 % — ABNORMAL HIGH (ref 11.5–15.5)
WBC: 6.2 10*3/uL (ref 4.0–10.5)

## 2013-05-18 LAB — COMPREHENSIVE METABOLIC PANEL
ALT: 60 U/L — ABNORMAL HIGH (ref 0–53)
AST: 39 U/L — ABNORMAL HIGH (ref 0–37)
Albumin: 3.3 g/dL — ABNORMAL LOW (ref 3.5–5.2)
Alkaline Phosphatase: 77 U/L (ref 39–117)
BUN: 18 mg/dL (ref 6–23)
CO2: 19 mEq/L (ref 19–32)
Calcium: 7.6 mg/dL — ABNORMAL LOW (ref 8.4–10.5)
Chloride: 111 mEq/L (ref 96–112)
Creatinine, Ser: 1.48 mg/dL — ABNORMAL HIGH (ref 0.50–1.35)
GFR calc Af Amer: 60 mL/min — ABNORMAL LOW (ref >90)
GFR calc non Af Amer: 52 mL/min — ABNORMAL LOW (ref >90)
Glucose, Bld: 117 mg/dL — ABNORMAL HIGH (ref 70–99)
Potassium: 3.4 mEq/L — ABNORMAL LOW (ref 3.5–5.1)
Sodium: 141 mEq/L (ref 135–145)
Total Bilirubin: 0.5 mg/dL (ref 0.3–1.2)
Total Protein: 6 g/dL (ref 6.0–8.3)

## 2013-05-18 MED ORDER — SODIUM CHLORIDE 0.9 % IV SOLN
Freq: Once | INTRAVENOUS | Status: AC
  Administered 2013-05-18: 15:00:00 via INTRAVENOUS

## 2013-05-18 MED ORDER — ZOLEDRONIC ACID 4 MG/5ML IV CONC
2.0000 mg | Freq: Once | INTRAVENOUS | Status: AC
  Administered 2013-05-18: 2 mg via INTRAVENOUS
  Filled 2013-05-18: qty 2.5

## 2013-05-18 MED ORDER — HEPARIN SOD (PORK) LOCK FLUSH 100 UNIT/ML IV SOLN
500.0000 [IU] | Freq: Once | INTRAVENOUS | Status: AC
  Administered 2013-05-18: 500 [IU] via INTRAVENOUS
  Filled 2013-05-18: qty 5

## 2013-05-18 NOTE — Progress Notes (Signed)
Roda Shutters presented for Portacath access and monthly labs for zometa. Portacath located rt chest wall accessed with  H 20 needle. Good blood return present. Portacath flushed with 76ml NS after labs drawn. Tolerated infusion well. Port flushed with 500 units heparin and access d/ced.

## 2013-05-19 LAB — KAPPA/LAMBDA LIGHT CHAINS
Kappa free light chain: 2.16 mg/dL — ABNORMAL HIGH (ref 0.33–1.94)
Kappa, lambda light chain ratio: 1.23 (ref 0.26–1.65)
Lambda free light chains: 1.76 mg/dL (ref 0.57–2.63)

## 2013-05-20 LAB — MULTIPLE MYELOMA PANEL, SERUM
Albumin ELP: 61.4 % (ref 55.8–66.1)
Alpha-1-Globulin: 5.4 % — ABNORMAL HIGH (ref 2.9–4.9)
Alpha-2-Globulin: 11.9 % — ABNORMAL HIGH (ref 7.1–11.8)
Beta 2: 4.2 % (ref 3.2–6.5)
Beta Globulin: 7 % (ref 4.7–7.2)
Gamma Globulin: 10.1 % — ABNORMAL LOW (ref 11.1–18.8)
IgA: 64 mg/dL — ABNORMAL LOW (ref 68–379)
IgG (Immunoglobin G), Serum: 561 mg/dL — ABNORMAL LOW (ref 650–1600)
IgM, Serum: 69 mg/dL (ref 41–251)
M-Spike, %: NOT DETECTED g/dL
Total Protein: 5.5 g/dL — ABNORMAL LOW (ref 6.0–8.3)

## 2013-05-29 NOTE — Progress Notes (Signed)
Franklin Font, MD [name] [address] [name] [id number]  Multiple myeloma - Plan: CBC with Differential, Comprehensive metabolic panel, Lactate dehydrogenase, C-reactive protein, Beta 2 microglobuline, serum, Multiple myeloma panel, serum, Kappa/lambda light chains  CURRENT THERAPY:Lenalidomide 5 mg 7 days on and 7 days off with 20 mg Dexamethasone BID every Friday only. Zometa every 4 weeks.    INTERVAL HISTORY: [name] 52 y.o. male returns for  regular  visit for followup of kappa light chain multiple myeloma, stage II disease presently on maintenance [name] 5 mg daily 7 days on, 7 days off along with weekly dexamethasone on Friday of every week. Zometa every 4 weeks.   I personally reviewed and went over laboratory results with the patient.  Lab results follow below, but are overall stable.   He is following Dr. Laural Golden for his diarrhea.  His Creon was recently adjusted by Dr. Laural Golden and increased.   Kyzier notes some upper extremity muscle cramping that comes and goes.  His K+ was noted to be minimally low and he is on kdur for correction of this.  I am not sure if this is contributing to his cramping.  He notes an erythematous rash that occurs in areas of chaffing such as inner thighs.  I recommended moisturizing lotion to these areas particularly after bathing.  He notes that the rash has resolved and occurred one time.  He reports that it resolved spontaneously.  Hematologically, he denies any complaints and ROS questioning is negative.   His orchitis and paronychia has resolved since IVIG infusion.   Past Medical History  Diagnosis Date  . Arteriosclerotic cardiovascular disease (ASCVD)     MI-2000s; stent to the proximal LAD and diagonal in 2001; stress nuclear in 2008-impaired exercise capacity, left ventricular dilatation, moderately to severely depressed EF, apical, inferior and anteroseptal scar  . Hypertension   . Bence-Jones proteinuria  05/05/2011  . Diabetes mellitus     Insulin  . GERD (gastroesophageal reflux disease)   . Pedal edema     Venous insufficiency  . Obesity   . Hyperlipidemia   . Injection site reaction   . Cellulitis of leg     both legs  . Chronic kidney disease, stage 3, mod decreased GFR     Creatinine of 1.84 in 06/2011 and 1.5 in 07/2011  . Chronic diarrhea   . Sleep apnea     uses cpap  . Ulcer   . Atrial flutter   . Multiple myeloma 07/01/2011  . Myocardial infarction 2000  . Gout     has DIABETES MELLITUS, TYPE II, ON INSULIN; Morbid obesity; SLEEP APNEA; Multiple myeloma; Arteriosclerotic cardiovascular disease (ASCVD); Hypertension; Hyperlipidemia; DDD (degenerative disc disease), cervical; Atrial flutter; Chronic pancreatitis; and Anemia, normocytic normochromic on his problem list.     has No Known Allergies.  [name] had no medications administered during this visit.  Past Surgical History  Procedure Laterality Date  . Laparoscopic gastric banding  2006    has been removed  . Wrist surgery      Left; removal of bone fragment  . Incision and drainage abscess anal    . Abscess drainage      Scrotal  . Bone marrow biopsy  05/13/11  . Portacath placement  07/07/2011    Procedure: INSERTION PORT-A-CATH;  Surgeon: Scherry Ran, MD;  Location: [facility];  Service: General;  Laterality: N/A;  . Colonoscopy  11/28/2011    Procedure: COLONOSCOPY;  Surgeon:  Rogene Houston, MD;  Location: [facility];  Service: Endoscopy;  Laterality: N/A;  930  . Esophagogastroduodenoscopy  01/02/2012    Procedure: ESOPHAGOGASTRODUODENOSCOPY (EGD);  Surgeon: Rogene Houston, MD;  Location: [facility];  Service: Endoscopy;  Laterality: N/A;  100  . Esophageal biopsy  01/02/2012    Procedure: BIOPSY;  Surgeon: Rogene Houston, MD;  Location: [facility];  Service: Endoscopy;  Laterality: N/A;  . Esophagogastroduodenoscopy N/A 09/20/2012    Procedure: ESOPHAGOGASTRODUODENOSCOPY (EGD);  Surgeon:  Rogene Houston, MD;  Location: [facility];  Service: Endoscopy;  Laterality: N/A;  . Eus N/A 10/07/2012    Procedure: UPPER ENDOSCOPIC ULTRASOUND (EUS) LINEAR;  Surgeon: Milus Banister, MD;  Location: [facility];  Service: Endoscopy;  Laterality: N/A;  . Cardioversion N/A 10/13/2012    Procedure: CARDIOVERSION;  Surgeon: Yehuda Savannah, MD;  Location: [facility];  Service: Cardiovascular;  Laterality: N/A;  . Cardiac catheterization      cardiac stent  . Port-a-cath removal Left 12/07/2012    Procedure: REMOVAL PORT-A-CATH;  Surgeon: Scherry Ran, MD;  Location: [facility];  Service: General;  Laterality: Left;  . Portacath placement N/A 12/07/2012    Procedure: INSERTION PORT-A-CATH;  Surgeon: Scherry Ran, MD;  Location: [facility];  Service: General;  Laterality: N/A;  Attempted portacath placement on left and right side    Denies any headaches, dizziness, double vision, fevers, chills, night sweats, nausea, vomiting, constipation, chest pain, heart palpitations, shortness of breath, blood in stool, black tarry stool, urinary pain, urinary burning, urinary frequency, hematuria.   PHYSICAL EXAMINATION  ECOG PERFORMANCE STATUS: 0 - Asymptomatic  Filed Vitals:   05/30/13 1500  BP: 108/66  Pulse: 76  Resp: 24    GENERAL:alert, no distress, well nourished, well developed, comfortable, cooperative, obese and smiling SKIN: skin color, texture, turgor are normal, no rashes or significant lesions HEAD: Normocephalic, No masses, lesions, tenderness or abnormalities EYES: normal, PERRLA, EOMI, Conjunctiva are pink and non-injected EARS: External ears normal OROPHARYNX:mucous membranes are moist  NECK: supple, no adenopathy, thyroid normal size, non-tender, without nodularity, no stridor, non-tender, trachea midline LYMPH:  no palpable lymphadenopathy BREAST:gynecomastia LUNGS: clear to auscultation and percussion HEART: regular rate & rhythm, no murmurs, no gallops, S1 normal and S2  normal ABDOMEN:abdomen soft, non-tender, obese and normal bowel sounds BACK: Back symmetric, no curvature. EXTREMITIES:less then 2 second capillary refill, no joint deformities, effusion, or inflammation, no edema, no skin discoloration, no clubbing, no cyanosis  NEURO: alert & oriented x 3 with fluent speech, no focal motor/sensory deficits, gait normal   LABORATORY DATA: CBC    Component Value Date/Time   WBC 6.2 05/18/2013 1345   RBC 3.60* 05/18/2013 1345   HGB 11.1* 05/18/2013 1345   HCT 33.9* 05/18/2013 1345   PLT 149* 05/18/2013 1345   MCV 94.2 05/18/2013 1345   MCH 30.8 05/18/2013 1345   MCHC 32.7 05/18/2013 1345   RDW 16.2* 05/18/2013 1345   LYMPHSABS 2.0 05/18/2013 1345   MONOABS 1.1* 05/18/2013 1345   EOSABS 0.2 05/18/2013 1345   BASOSABS 0.0 05/18/2013 1345      Chemistry      Component Value Date/Time   NA 141 05/18/2013 1345   K 3.4* 05/18/2013 1345   CL 111 05/18/2013 1345   CO2 19 05/18/2013 1345   BUN 18 05/18/2013 1345   CREATININE 1.48* 05/18/2013 1345   CREATININE 2.24* 10/05/2012 1411      Component Value Date/Time   CALCIUM  7.6* 05/18/2013 1345   ALKPHOS 77 05/18/2013 1345   AST 39* 05/18/2013 1345   ALT 60* 05/18/2013 1345   BILITOT 0.5 05/18/2013 1345     Results for DAMONTA, SUNDEEN (MRN [id number]) as of 05/29/2013 12:33  Ref. Range 05/18/2013 13:45  Albumin ELP Latest Range: 55.8-66.1 % 61.4  Alpha-1-Globulin Latest Range: 2.9-4.9 % 5.4 (H)  Alpha-2-Globulin Latest Range: 7.1-11.8 % 11.9 (H)  Beta Globulin Latest Range: 4.7-7.2 % 7.0  Beta 2 Latest Range: 3.2-6.5 % 4.2  Gamma Globulin Latest Range: 11.1-18.8 % 10.1 (L)  M-SPIKE, % No range found NOT DETECTED  SPE Interp. No range found (NOTE)  Comment No range found (NOTE)  IgG (Immunoglobin G), Serum Latest Range: [phone] mg/dL 561 (L)  IgA Latest Range: 68-379 mg/dL 64 (L)  IgM, Serum Latest Range: 41-251 mg/dL 69  Kappa free light chain Latest Range: 0.33-1.94 mg/dL 2.16 (H)    Lamda free light chains Latest Range: 0.57-2.63 mg/dL 1.76  Kappa, lamda light chain ratio Latest Range: 0.26-1.65  1.23      ASSESSMENT:  1. Kappa light chain multiple myeloma, stage II disease presently on maintenance [name] 5 mg daily 7 days on, 7 days off along with weekly dexamethasone on Friday of every week. Zometa every 4 weeks.  2. Gastric ulcer, likely cause of epigastric abdominal pain, followed by GI.  3. Recent orchitis bilaterally status post multiple antibiotics, finally improved with IVIG  4. Diabetes mellitus  5. High-volume diarrhea followed by Dr. Laural Golden, suspected to be secondary to pancreatic steatorrhea.  6. Recurrent orchitis, S/P Cipro and IVIG on 11/10- 04/13/2013. Resolved 7. Left finger paronychia, S/P Cipro and IVIG on 11/10- 04/13/2013. Resolved 8. Hypogammaglobulinemia, secondary to multiple myeloma.  9. UE muscle cramping, ?secondary to hypokalemia, on replacement 10. Diarrhea, followed by Dr. Laural Golden.  On Creon. 11. Chaffing-like rash  Patient Active Problem List   Diagnosis Date Noted  . Anemia, normocytic normochromic 10/09/2012  . Chronic pancreatitis 10/07/2012  . Atrial flutter 09/17/2012  . DDD (degenerative disc disease), cervical 03/18/2012  . Arteriosclerotic cardiovascular disease (ASCVD)   . Hypertension   . Hyperlipidemia   . Multiple myeloma 07/01/2011  . Morbid obesity 04/29/2010  . DIABETES MELLITUS, TYPE II, ON INSULIN 11/15/2008  . SLEEP APNEA 11/15/2008    PLAN:  1. I personally reviewed and went over laboratory results with the patient. 2. Continue Zometa every 4 weeks (last given on 05/18/2013). 3. Continue Lenalidomide 5 mg, 7 days on and 7 days off with 20 mg Dexamethasone BID every Friday only. 4. Labs every 6 weeks: CBC diff, CMET, CRP, B2M, LDH, MM panel 5. Continue with Kdur supplementation. 6. Recommend moisturizing lotion to rash areas. 7. Return in 12 weeks for follow-up   THERAPY PLAN:  From a multiple  myeloma standpoint, his disease is stable and we will focus on symptom management and depth of remission.   All questions were answered. The patient knows to call the clinic with any problems, questions or concerns. We can certainly see the patient much sooner if necessary.  Patient and plan discussed with Dr. Farrel Gobble and he is in agreement with the aforementioned.   KEFALAS,[name]

## 2013-05-30 ENCOUNTER — Encounter (HOSPITAL_BASED_OUTPATIENT_CLINIC_OR_DEPARTMENT_OTHER): Payer: Managed Care, Other (non HMO) | Admitting: Oncology

## 2013-05-30 ENCOUNTER — Encounter (HOSPITAL_COMMUNITY): Payer: Self-pay | Admitting: Oncology

## 2013-05-30 VITALS — BP 108/66 | HR 76 | Resp 24 | Wt 325.8 lb

## 2013-05-30 DIAGNOSIS — R252 Cramp and spasm: Secondary | ICD-10-CM

## 2013-05-30 DIAGNOSIS — C9000 Multiple myeloma not having achieved remission: Secondary | ICD-10-CM

## 2013-05-30 DIAGNOSIS — R197 Diarrhea, unspecified: Secondary | ICD-10-CM

## 2013-05-30 DIAGNOSIS — D801 Nonfamilial hypogammaglobulinemia: Secondary | ICD-10-CM

## 2013-05-30 DIAGNOSIS — R21 Rash and other nonspecific skin eruption: Secondary | ICD-10-CM

## 2013-05-30 NOTE — Patient Instructions (Signed)
[facility] Discharge Instructions  RECOMMENDATIONS MADE BY THE CONSULTANT AND ANY TEST RESULTS WILL BE SENT TO YOUR REFERRING PHYSICIAN.  Continue lab work and Zometa infusion every 4 weeks. MD appointment in 12 weeks.  Thank you for choosing [facility] to provide your oncology and hematology care.  To afford each patient quality time with our providers, please arrive at least 15 minutes before your scheduled appointment time.  With your help, our goal is to use those 15 minutes to complete the necessary work-up to ensure our physicians have the information they need to help with your evaluation and healthcare recommendations.    Effective January 1st, 2014, we ask that you re-schedule your appointment with our physicians should you arrive 10 or more minutes late for your appointment.  We strive to give you quality time with our providers, and arriving late affects you and other patients whose appointments are after yours.    Again, thank you for choosing [facility].  Our hope is that these requests will decrease the amount of time that you wait before being seen by our physicians.       _____________________________________________________________  Should you have questions after your visit to [facility], please contact our office at [phone] between the hours of 8:30 a.m. and 5:00 p.m.  Voicemails left after 4:30 p.m. will not be returned until the following business day.  For prescription refill requests, have your pharmacy contact our office with your prescription refill request.

## 2013-05-31 ENCOUNTER — Other Ambulatory Visit (HOSPITAL_COMMUNITY): Payer: Self-pay | Admitting: Oncology

## 2013-05-31 DIAGNOSIS — E876 Hypokalemia: Secondary | ICD-10-CM

## 2013-05-31 MED ORDER — POTASSIUM CHLORIDE CRYS ER 20 MEQ PO TBCR: 40.0000 meq | EXTENDED_RELEASE_TABLET | Freq: Every day | ORAL | Status: DC

## 2013-06-15 ENCOUNTER — Ambulatory Visit (HOSPITAL_COMMUNITY): Payer: Managed Care, Other (non HMO)

## 2013-06-16 ENCOUNTER — Encounter (HOSPITAL_COMMUNITY): Payer: Managed Care, Other (non HMO) | Attending: Oncology

## 2013-06-16 VITALS — BP 107/62 | HR 72 | Temp 97.1°F | Resp 18

## 2013-06-16 DIAGNOSIS — C9000 Multiple myeloma not having achieved remission: Secondary | ICD-10-CM

## 2013-06-16 DIAGNOSIS — X58XXXA Exposure to other specified factors, initial encounter: Secondary | ICD-10-CM | POA: Insufficient documentation

## 2013-06-16 LAB — SEDIMENTATION RATE: Sed Rate: 23 mm/hr — ABNORMAL HIGH (ref 0–16)

## 2013-06-16 LAB — COMPREHENSIVE METABOLIC PANEL
ALT: 44 U/L (ref 0–53)
AST: 38 U/L — ABNORMAL HIGH (ref 0–37)
Albumin: 3.4 g/dL — ABNORMAL LOW (ref 3.5–5.2)
Alkaline Phosphatase: 85 U/L (ref 39–117)
BUN: 27 mg/dL — ABNORMAL HIGH (ref 6–23)
CO2: 21 mEq/L (ref 19–32)
Calcium: 8.5 mg/dL (ref 8.4–10.5)
Chloride: 110 mEq/L (ref 96–112)
Creatinine, Ser: 1.8 mg/dL — ABNORMAL HIGH (ref 0.50–1.35)
GFR calc Af Amer: 48 mL/min — ABNORMAL LOW (ref >90)
GFR calc non Af Amer: 41 mL/min — ABNORMAL LOW (ref >90)
Glucose, Bld: 146 mg/dL — ABNORMAL HIGH (ref 70–99)
Potassium: 3.9 mEq/L (ref 3.7–5.3)
Sodium: 145 mEq/L (ref 137–147)
Total Bilirubin: 0.5 mg/dL (ref 0.3–1.2)
Total Protein: 6 g/dL (ref 6.0–8.3)

## 2013-06-16 LAB — CBC WITH DIFFERENTIAL/PLATELET
Basophils Absolute: 0 10*3/uL (ref 0.0–0.1)
Basophils Relative: 0 % (ref 0–1)
Eosinophils Absolute: 0.4 10*3/uL (ref 0.0–0.7)
Eosinophils Relative: 7 % — ABNORMAL HIGH (ref 0–5)
HCT: 31.9 % — ABNORMAL LOW (ref 39.0–52.0)
Hemoglobin: 10.6 g/dL — ABNORMAL LOW (ref 13.0–17.0)
Lymphocytes Relative: 29 % (ref 12–46)
Lymphs Abs: 1.7 10*3/uL (ref 0.7–4.0)
MCH: 31.5 pg (ref 26.0–34.0)
MCHC: 33.2 g/dL (ref 30.0–36.0)
MCV: 94.9 fL (ref 78.0–100.0)
Monocytes Absolute: 0.9 10*3/uL (ref 0.1–1.0)
Monocytes Relative: 15 % — ABNORMAL HIGH (ref 3–12)
Neutro Abs: 2.8 10*3/uL (ref 1.7–7.7)
Neutrophils Relative %: 49 % (ref 43–77)
Platelets: 186 10*3/uL (ref 150–400)
RBC: 3.36 MIL/uL — ABNORMAL LOW (ref 4.22–5.81)
RDW: 15.1 % (ref 11.5–15.5)
WBC: 5.7 10*3/uL (ref 4.0–10.5)

## 2013-06-16 LAB — LACTATE DEHYDROGENASE: LDH: 270 U/L — ABNORMAL HIGH (ref 94–250)

## 2013-06-16 LAB — C-REACTIVE PROTEIN: CRP: <0.5 mg/dL — ABNORMAL LOW (ref <0.60)

## 2013-06-16 MED ORDER — SODIUM CHLORIDE 0.9 % IJ SOLN: 3.0000 mL | Freq: Once | INTRAMUSCULAR | Status: DC | PRN

## 2013-06-16 MED ORDER — HEPARIN SOD (PORK) LOCK FLUSH 100 UNIT/ML IV SOLN
INTRAVENOUS | Status: AC
  Filled 2013-06-16: qty 5

## 2013-06-16 MED ORDER — HEPARIN SOD (PORK) LOCK FLUSH 100 UNIT/ML IV SOLN
500.0000 [IU] | Freq: Once | INTRAVENOUS | Status: AC
  Administered 2013-06-16: 500 [IU] via INTRAVENOUS

## 2013-06-16 MED ORDER — ZOLEDRONIC ACID 4 MG/5ML IV CONC
2.0000 mg | Freq: Once | INTRAVENOUS | Status: AC
  Administered 2013-06-16: 2 mg via INTRAVENOUS
  Filled 2013-06-16: qty 2.5

## 2013-06-16 MED ORDER — SODIUM CHLORIDE 0.9 % IV SOLN
Freq: Once | INTRAVENOUS | Status: AC
  Administered 2013-06-16: 14:00:00 via INTRAVENOUS

## 2013-06-16 MED ORDER — ALTEPLASE 2 MG IJ SOLR: 2.0000 mg | Freq: Once | INTRAMUSCULAR | Status: DC | PRN

## 2013-06-17 ENCOUNTER — Other Ambulatory Visit (INDEPENDENT_AMBULATORY_CARE_PROVIDER_SITE_OTHER): Payer: Self-pay | Admitting: Internal Medicine

## 2013-06-17 DIAGNOSIS — R197 Diarrhea, unspecified: Secondary | ICD-10-CM

## 2013-06-17 LAB — BETA 2 MICROGLOBULIN, SERUM: Beta-2 Microglobulin: 3.38 mg/L — ABNORMAL HIGH (ref 1.01–1.73)

## 2013-06-17 LAB — KAPPA/LAMBDA LIGHT CHAINS
Kappa free light chain: 1.21 mg/dL (ref 0.33–1.94)
Kappa, lambda light chain ratio: 1.33 (ref 0.26–1.65)
Lambda free light chains: 0.91 mg/dL (ref 0.57–2.63)

## 2013-06-20 ENCOUNTER — Other Ambulatory Visit (INDEPENDENT_AMBULATORY_CARE_PROVIDER_SITE_OTHER): Payer: Self-pay | Admitting: Internal Medicine

## 2013-06-20 ENCOUNTER — Other Ambulatory Visit (HOSPITAL_COMMUNITY): Payer: Self-pay | Admitting: Oncology

## 2013-06-20 DIAGNOSIS — C9000 Multiple myeloma not having achieved remission: Secondary | ICD-10-CM

## 2013-06-20 MED ORDER — LENALIDOMIDE 5 MG PO CAPS: ORAL_CAPSULE | ORAL | Status: DC

## 2013-06-22 LAB — MULTIPLE MYELOMA PANEL, SERUM
Albumin ELP: 60.9 % (ref 55.8–66.1)
Alpha-1-Globulin: 6.4 % — ABNORMAL HIGH (ref 2.9–4.9)
Alpha-2-Globulin: 11.7 % (ref 7.1–11.8)
Beta 2: 4.8 % (ref 3.2–6.5)
Beta Globulin: 7.1 % (ref 4.7–7.2)
Gamma Globulin: 9.1 % — ABNORMAL LOW (ref 11.1–18.8)
IgA: 65 mg/dL — ABNORMAL LOW (ref 68–379)
IgG (Immunoglobin G), Serum: 466 mg/dL — ABNORMAL LOW (ref 650–1600)
IgM, Serum: 87 mg/dL (ref 41–251)
M-Spike, %: NOT DETECTED g/dL
Total Protein: 5.3 g/dL — ABNORMAL LOW (ref 6.0–8.3)

## 2013-06-24 ENCOUNTER — Other Ambulatory Visit (HOSPITAL_COMMUNITY): Payer: Self-pay | Admitting: Oncology

## 2013-06-24 DIAGNOSIS — C9000 Multiple myeloma not having achieved remission: Secondary | ICD-10-CM

## 2013-06-24 MED ORDER — SULFAMETHOXAZOLE-TMP DS 800-160 MG PO TABS: 1.0000 | ORAL_TABLET | ORAL | Status: DC

## 2013-06-24 MED ORDER — DEXAMETHASONE 4 MG PO TABS: 40.0000 mg | ORAL_TABLET | ORAL | Status: DC

## 2013-06-27 ENCOUNTER — Other Ambulatory Visit: Payer: Self-pay | Admitting: Adult Health

## 2013-07-13 ENCOUNTER — Other Ambulatory Visit (HOSPITAL_COMMUNITY): Payer: Self-pay | Admitting: Oncology

## 2013-07-13 ENCOUNTER — Telehealth (HOSPITAL_COMMUNITY): Payer: Self-pay | Admitting: Oncology

## 2013-07-13 DIAGNOSIS — E876 Hypokalemia: Secondary | ICD-10-CM

## 2013-07-13 MED ORDER — POTASSIUM CHLORIDE CRYS ER 20 MEQ PO TBCR: 40.0000 meq | EXTENDED_RELEASE_TABLET | Freq: Every day | ORAL | Status: DC

## 2013-07-14 ENCOUNTER — Encounter (HOSPITAL_COMMUNITY): Payer: Managed Care, Other (non HMO) | Attending: Oncology

## 2013-07-14 VITALS — BP 111/74 | HR 65 | Temp 98.4°F | Resp 22 | Wt 317.0 lb

## 2013-07-14 DIAGNOSIS — C9000 Multiple myeloma not having achieved remission: Secondary | ICD-10-CM | POA: Insufficient documentation

## 2013-07-14 DIAGNOSIS — X58XXXA Exposure to other specified factors, initial encounter: Secondary | ICD-10-CM | POA: Insufficient documentation

## 2013-07-14 LAB — CBC WITH DIFFERENTIAL/PLATELET
Basophils Absolute: 0 10*3/uL (ref 0.0–0.1)
Basophils Relative: 0 % (ref 0–1)
Eosinophils Absolute: 0.7 10*3/uL (ref 0.0–0.7)
Eosinophils Relative: 10 % — ABNORMAL HIGH (ref 0–5)
HCT: 33.7 % — ABNORMAL LOW (ref 39.0–52.0)
Hemoglobin: 11.2 g/dL — ABNORMAL LOW (ref 13.0–17.0)
Lymphocytes Relative: 38 % (ref 12–46)
Lymphs Abs: 2.5 10*3/uL (ref 0.7–4.0)
MCH: 30.9 pg (ref 26.0–34.0)
MCHC: 33.2 g/dL (ref 30.0–36.0)
MCV: 92.8 fL (ref 78.0–100.0)
Monocytes Absolute: 0.9 10*3/uL (ref 0.1–1.0)
Monocytes Relative: 14 % — ABNORMAL HIGH (ref 3–12)
Neutro Abs: 2.5 10*3/uL (ref 1.7–7.7)
Neutrophils Relative %: 38 % — ABNORMAL LOW (ref 43–77)
Platelets: 143 10*3/uL — ABNORMAL LOW (ref 150–400)
RBC: 3.63 MIL/uL — ABNORMAL LOW (ref 4.22–5.81)
RDW: 15.2 % (ref 11.5–15.5)
WBC: 6.6 10*3/uL (ref 4.0–10.5)

## 2013-07-14 LAB — C-REACTIVE PROTEIN: CRP: <0.5 mg/dL — ABNORMAL LOW (ref <0.60)

## 2013-07-14 LAB — LACTATE DEHYDROGENASE: LDH: 279 U/L — ABNORMAL HIGH (ref 94–250)

## 2013-07-14 LAB — SEDIMENTATION RATE: Sed Rate: 22 mm/hr — ABNORMAL HIGH (ref 0–16)

## 2013-07-14 LAB — COMPREHENSIVE METABOLIC PANEL
ALT: 65 U/L — ABNORMAL HIGH (ref 0–53)
AST: 44 U/L — ABNORMAL HIGH (ref 0–37)
Albumin: 3.3 g/dL — ABNORMAL LOW (ref 3.5–5.2)
Alkaline Phosphatase: 90 U/L (ref 39–117)
BUN: 26 mg/dL — ABNORMAL HIGH (ref 6–23)
CO2: 20 mEq/L (ref 19–32)
Calcium: 8.6 mg/dL (ref 8.4–10.5)
Chloride: 107 mEq/L (ref 96–112)
Creatinine, Ser: 1.59 mg/dL — ABNORMAL HIGH (ref 0.50–1.35)
GFR calc Af Amer: 55 mL/min — ABNORMAL LOW (ref >90)
GFR calc non Af Amer: 48 mL/min — ABNORMAL LOW (ref >90)
Glucose, Bld: 137 mg/dL — ABNORMAL HIGH (ref 70–99)
Potassium: 3.9 mEq/L (ref 3.7–5.3)
Sodium: 141 mEq/L (ref 137–147)
Total Bilirubin: 0.4 mg/dL (ref 0.3–1.2)
Total Protein: 6.1 g/dL (ref 6.0–8.3)

## 2013-07-14 MED ORDER — ZOLEDRONIC ACID 4 MG/5ML IV CONC
2.0000 mg | Freq: Once | INTRAVENOUS | Status: AC
  Administered 2013-07-14: 2 mg via INTRAVENOUS
  Filled 2013-07-14: qty 2.5

## 2013-07-14 MED ORDER — HEPARIN SOD (PORK) LOCK FLUSH 100 UNIT/ML IV SOLN
500.0000 [IU] | Freq: Once | INTRAVENOUS | Status: AC
  Administered 2013-07-14: 500 [IU] via INTRAVENOUS
  Filled 2013-07-14: qty 5

## 2013-07-14 MED ORDER — SODIUM CHLORIDE 0.9 % IV SOLN
INTRAVENOUS | Status: DC
  Administered 2013-07-14: 14:00:00 via INTRAVENOUS

## 2013-07-14 MED ORDER — SODIUM CHLORIDE 0.9 % IJ SOLN
10.0000 mL | INTRAMUSCULAR | Status: DC | PRN
  Administered 2013-07-14: 10 mL via INTRAVENOUS

## 2013-07-15 LAB — BETA 2 MICROGLOBULIN, SERUM: Beta-2 Microglobulin: 3.74 mg/L — ABNORMAL HIGH (ref 1.01–1.73)

## 2013-07-18 LAB — KAPPA/LAMBDA LIGHT CHAINS
Kappa free light chain: 2.01 mg/dL — ABNORMAL HIGH (ref 0.33–1.94)
Kappa, lambda light chain ratio: 0.93 (ref 0.26–1.65)
Lambda free light chains: 2.15 mg/dL (ref 0.57–2.63)

## 2013-07-18 LAB — MULTIPLE MYELOMA PANEL, SERUM
Albumin ELP: 61 % (ref 55.8–66.1)
Alpha-1-Globulin: 6 % — ABNORMAL HIGH (ref 2.9–4.9)
Alpha-2-Globulin: 12.6 % — ABNORMAL HIGH (ref 7.1–11.8)
Beta 2: 4.1 % (ref 3.2–6.5)
Beta Globulin: 6.9 % (ref 4.7–7.2)
Gamma Globulin: 9.4 % — ABNORMAL LOW (ref 11.1–18.8)
IgA: 74 mg/dL (ref 68–379)
IgG (Immunoglobin G), Serum: 511 mg/dL — ABNORMAL LOW (ref 650–1600)
IgM, Serum: 60 mg/dL (ref 41–251)
M-Spike, %: NOT DETECTED g/dL
Total Protein: 5.6 g/dL — ABNORMAL LOW (ref 6.0–8.3)

## 2013-07-21 ENCOUNTER — Other Ambulatory Visit (HOSPITAL_COMMUNITY): Payer: Self-pay | Admitting: Oncology

## 2013-07-21 DIAGNOSIS — C9000 Multiple myeloma not having achieved remission: Secondary | ICD-10-CM

## 2013-07-21 MED ORDER — LENALIDOMIDE 5 MG PO CAPS: ORAL_CAPSULE | ORAL | Status: DC

## 2013-07-22 ENCOUNTER — Other Ambulatory Visit (INDEPENDENT_AMBULATORY_CARE_PROVIDER_SITE_OTHER): Payer: Self-pay | Admitting: Internal Medicine

## 2013-07-29 ENCOUNTER — Telehealth: Payer: Self-pay | Admitting: Adult Health

## 2013-07-29 ENCOUNTER — Other Ambulatory Visit (INDEPENDENT_AMBULATORY_CARE_PROVIDER_SITE_OTHER): Payer: Self-pay | Admitting: Internal Medicine

## 2013-07-29 MED ORDER — ATORVASTATIN CALCIUM 80 MG PO TABS: 80.0000 mg | ORAL_TABLET | Freq: Every day | ORAL | Status: DC

## 2013-07-29 NOTE — Telephone Encounter (Signed)
rx sent to pharmacy by e-script Placed comment in pt refill/vm to call office to schedule f/u apt per noted overdue and sent month supply to give time to schedule apt

## 2013-07-29 NOTE — Telephone Encounter (Signed)
Received fax refill request  Rx # [id number] Medication:  Atorvastatin 80 mg tablets Qty 30 Sig:  Take one tablet daily Physician:  Purcell Nails

## 2013-08-03 ENCOUNTER — Other Ambulatory Visit (INDEPENDENT_AMBULATORY_CARE_PROVIDER_SITE_OTHER): Payer: Self-pay | Admitting: Internal Medicine

## 2013-08-11 ENCOUNTER — Ambulatory Visit (HOSPITAL_COMMUNITY): Payer: Managed Care, Other (non HMO)

## 2013-08-12 ENCOUNTER — Other Ambulatory Visit (HOSPITAL_COMMUNITY): Payer: Self-pay | Admitting: Oncology

## 2013-08-12 DIAGNOSIS — C9000 Multiple myeloma not having achieved remission: Secondary | ICD-10-CM

## 2013-08-12 MED ORDER — LENALIDOMIDE 5 MG PO CAPS: ORAL_CAPSULE | ORAL | Status: DC

## 2013-08-15 ENCOUNTER — Other Ambulatory Visit (HOSPITAL_COMMUNITY): Payer: Self-pay | Admitting: Oncology

## 2013-08-15 ENCOUNTER — Encounter (HOSPITAL_COMMUNITY): Payer: Managed Care, Other (non HMO) | Attending: Oncology

## 2013-08-15 DIAGNOSIS — C9000 Multiple myeloma not having achieved remission: Secondary | ICD-10-CM | POA: Insufficient documentation

## 2013-08-15 DIAGNOSIS — X58XXXA Exposure to other specified factors, initial encounter: Secondary | ICD-10-CM | POA: Insufficient documentation

## 2013-08-15 DIAGNOSIS — D801 Nonfamilial hypogammaglobulinemia: Secondary | ICD-10-CM

## 2013-08-15 LAB — COMPREHENSIVE METABOLIC PANEL
ALT: 56 U/L — ABNORMAL HIGH (ref 0–53)
AST: 44 U/L — ABNORMAL HIGH (ref 0–37)
Albumin: 3.1 g/dL — ABNORMAL LOW (ref 3.5–5.2)
Alkaline Phosphatase: 83 U/L (ref 39–117)
BUN: 27 mg/dL — ABNORMAL HIGH (ref 6–23)
CO2: 21 mEq/L (ref 19–32)
Calcium: 7.1 mg/dL — ABNORMAL LOW (ref 8.4–10.5)
Chloride: 109 mEq/L (ref 96–112)
Creatinine, Ser: 1.5 mg/dL — ABNORMAL HIGH (ref 0.50–1.35)
GFR calc Af Amer: 59 mL/min — ABNORMAL LOW (ref >90)
GFR calc non Af Amer: 51 mL/min — ABNORMAL LOW (ref >90)
Glucose, Bld: 182 mg/dL — ABNORMAL HIGH (ref 70–99)
Potassium: 3.4 mEq/L — ABNORMAL LOW (ref 3.7–5.3)
Sodium: 142 mEq/L (ref 137–147)
Total Bilirubin: 0.5 mg/dL (ref 0.3–1.2)
Total Protein: 5.6 g/dL — ABNORMAL LOW (ref 6.0–8.3)

## 2013-08-15 LAB — CBC WITH DIFFERENTIAL/PLATELET
Basophils Absolute: 0 10*3/uL (ref 0.0–0.1)
Basophils Relative: 0 % (ref 0–1)
Eosinophils Absolute: 0.5 10*3/uL (ref 0.0–0.7)
Eosinophils Relative: 6 % — ABNORMAL HIGH (ref 0–5)
HCT: 32.6 % — ABNORMAL LOW (ref 39.0–52.0)
Hemoglobin: 10.7 g/dL — ABNORMAL LOW (ref 13.0–17.0)
Lymphocytes Relative: 31 % (ref 12–46)
Lymphs Abs: 2.2 10*3/uL (ref 0.7–4.0)
MCH: 30.9 pg (ref 26.0–34.0)
MCHC: 32.8 g/dL (ref 30.0–36.0)
MCV: 94.2 fL (ref 78.0–100.0)
Monocytes Absolute: 0.6 10*3/uL (ref 0.1–1.0)
Monocytes Relative: 8 % (ref 3–12)
Neutro Abs: 3.8 10*3/uL (ref 1.7–7.7)
Neutrophils Relative %: 54 % (ref 43–77)
Platelets: 146 10*3/uL — ABNORMAL LOW (ref 150–400)
RBC: 3.46 MIL/uL — ABNORMAL LOW (ref 4.22–5.81)
RDW: 16.9 % — ABNORMAL HIGH (ref 11.5–15.5)
WBC: 7.1 10*3/uL (ref 4.0–10.5)

## 2013-08-15 LAB — C-REACTIVE PROTEIN: CRP: <0.5 mg/dL — ABNORMAL LOW (ref <0.60)

## 2013-08-15 LAB — LACTATE DEHYDROGENASE: LDH: 259 U/L — ABNORMAL HIGH (ref 94–250)

## 2013-08-15 LAB — SEDIMENTATION RATE: Sed Rate: 19 mm/hr — ABNORMAL HIGH (ref 0–16)

## 2013-08-15 MED ORDER — HEPARIN SOD (PORK) LOCK FLUSH 100 UNIT/ML IV SOLN
INTRAVENOUS | Status: AC
  Filled 2013-08-15: qty 5

## 2013-08-15 NOTE — Patient Instructions (Signed)
We are holding zometa today because your calcium is low Take 2 oscal daily Repeat labs and possibly give zometa in 1 month

## 2013-08-15 NOTE — Progress Notes (Signed)
[name] presented for Portacath access and flush and possible zometa.. Portacath located lt chest wall accessed with  H 20 needle. Good blood return present. zometa held due to decreased calcium level. Portacath flushed with 22ml NS and 500U/53ml Heparin and needle removed intact. Procedure without incident. Patient tolerated procedure well.

## 2013-08-15 NOTE — Progress Notes (Signed)
Patient's labs demonstrate hypocalcemia.  Recommend increase Oscal-D to BID dosing.  Hold Zometa today.  Will order PTH.  Patient and plan discussed with Dr. Farrel Gobble and he is in agreement with the aforementioned.   [name]

## 2013-08-16 ENCOUNTER — Telehealth (HOSPITAL_COMMUNITY): Payer: Self-pay

## 2013-08-16 ENCOUNTER — Other Ambulatory Visit (HOSPITAL_COMMUNITY): Payer: Self-pay | Admitting: Oncology

## 2013-08-16 DIAGNOSIS — E213 Hyperparathyroidism, unspecified: Secondary | ICD-10-CM

## 2013-08-16 LAB — KAPPA/LAMBDA LIGHT CHAINS
Kappa free light chain: 2.34 mg/dL — ABNORMAL HIGH (ref 0.33–1.94)
Kappa, lambda light chain ratio: 1.31 (ref 0.26–1.65)
Lambda free light chains: 1.78 mg/dL (ref 0.57–2.63)

## 2013-08-16 LAB — PTH, INTACT AND CALCIUM
Calcium, Total (PTH): 6.8 mg/dL — ABNORMAL LOW (ref 8.4–10.5)
PTH: 557.5 pg/mL — ABNORMAL HIGH (ref 14.0–72.0)

## 2013-08-16 NOTE — Telephone Encounter (Signed)
Message copied by Horton Chin on Tue Aug 16, 2013  2:27 PM ------      Message from: [facility]      Created: Tue Aug 16, 2013  2:19 PM       He needs a 24 hour urine collection for calcium and phosphorus.  Order placed. ------

## 2013-08-17 ENCOUNTER — Telehealth (HOSPITAL_COMMUNITY): Payer: Self-pay

## 2013-08-17 LAB — MULTIPLE MYELOMA PANEL, SERUM
Albumin ELP: 62.5 % (ref 55.8–66.1)
Alpha-1-Globulin: 6.3 % — ABNORMAL HIGH (ref 2.9–4.9)
Alpha-2-Globulin: 13.3 % — ABNORMAL HIGH (ref 7.1–11.8)
Beta 2: 3.5 % (ref 3.2–6.5)
Beta Globulin: 6.7 % (ref 4.7–7.2)
Gamma Globulin: 7.7 % — ABNORMAL LOW (ref 11.1–18.8)
IgA: 52 mg/dL — ABNORMAL LOW (ref 68–379)
IgG (Immunoglobin G), Serum: 358 mg/dL — ABNORMAL LOW (ref 650–1600)
IgM, Serum: 45 mg/dL — ABNORMAL LOW (ref 41–251)
M-Spike, %: NOT DETECTED g/dL
Total Protein: 5.3 g/dL — ABNORMAL LOW (ref 6.0–8.3)

## 2013-08-17 LAB — BETA 2 MICROGLOBULIN, SERUM: Beta-2 Microglobulin: 5.1 mg/L — ABNORMAL HIGH (ref <=2.51)

## 2013-08-17 NOTE — Telephone Encounter (Signed)
Message copied by Mellissa Kohut on Wed Aug 17, 2013  4:40 PM ------      Message from: [facility]      Created: Wed Aug 17, 2013  2:47 PM       Stable ------

## 2013-08-17 NOTE — Telephone Encounter (Signed)
Patient notified.  Results to be discussed in detail during office visit on 08/24/13.

## 2013-08-20 ENCOUNTER — Other Ambulatory Visit: Payer: Self-pay | Admitting: Adult Health

## 2013-08-22 ENCOUNTER — Ambulatory Visit (HOSPITAL_COMMUNITY): Payer: Managed Care, Other (non HMO) | Admitting: Oncology

## 2013-08-22 NOTE — Progress Notes (Signed)
[name], MD [address] [place] [id number]  Multiple myeloma  Hypocalcemia - Plan: Vitamin D 25 hydroxy, Vitamin D 25 hydroxy, Calcium, Calcium  CURRENT THERAPY:Lenalidomide 5 mg 7 days on and 7 days off with 20 mg Dexamethasone BID every Friday only. Zometa every 4 weeks.  INTERVAL HISTORY: [name] 53 y.o. male returns for  regular  visit for followup of kappa light chain multiple myeloma, stage II disease presently on maintenance [name] 5 mg daily 7 days on, 7 days off along with weekly dexamethasone on Friday of every week. Zometa every 4 weeks.   I personally reviewed and went over laboratory results with the patient.  The results are noted within this dictation.  Zometa was held last week for a severe hypocalcemia.  He was started on Oscal+D BID (he was previously taking daily).    Further testing was performed including PTH.  This subsequently led to a 24 hour urine collection for calcium, phosphorus, and protein. Depending on result, we may need to refer the patient to endocrinology.  Additionally, we will check calcium level today and Vit D level too.   On further questioning, he admits to intermittent perioral numbness and tingling that is intermittent, occurs infrequently, and resolves spontaneously after a few seconds.  I provided him education regarding hypocalcemia and the uses of Zometa with regards to multiple myeloma and other reasons (osteoporosis and hypercalcemia).  He is educated that one to the side effects of the medication and hypocalcemia.   Hematologically, he denies any complaints and ROS questioning is negative.   Past Medical History  Diagnosis Date  . Arteriosclerotic cardiovascular disease (ASCVD)     MI-2000s; stent to the proximal LAD and diagonal in 2001; stress nuclear in 2008-impaired exercise capacity, left ventricular dilatation, moderately to severely depressed EF, apical, inferior and anteroseptal scar  .  Hypertension   . 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 Diabetes mellitus     Insulin  . GERD (gastroesophageal reflux disease)   . Pedal edema     Venous insufficiency  . Obesity   . Hyperlipidemia   . Injection site reaction   . Cellulitis of leg     both legs  . Chronic kidney disease, stage 3, mod decreased GFR     Creatinine of 1.84 in 06/2011 and 1.5 in 07/2011  . Chronic diarrhea   . Sleep apnea     uses cpap  . Ulcer   . Atrial flutter   . Multiple myeloma 07/01/2011  . Myocardial infarction 2000  . Gout     has DIABETES MELLITUS, TYPE II, ON INSULIN; Morbid obesity; SLEEP APNEA; Multiple myeloma; Arteriosclerotic cardiovascular disease (ASCVD); Hypertension; Hyperlipidemia; DDD (degenerative disc disease), cervical; Atrial flutter; Chronic pancreatitis; and Anemia, normocytic normochromic on his problem list.     has No Known Allergies.  [name] does not currently have medications on file.  Past Surgical History  Procedure Laterality Date  . Laparoscopic gastric banding  2006    has been removed  . Wrist surgery      Left; removal of bone fragment  . Incision and drainage abscess anal    . Abscess drainage      Scrotal  . Bone marrow biopsy  05/13/11  . Portacath placement  07/07/2011    Procedure: INSERTION PORT-A-CATH;  Surgeon: Scherry Ran, MD;  Location: [facility];  Service: General;  Laterality: N/A;  . Colonoscopy  11/28/2011    Procedure: COLONOSCOPY;  Surgeon: Rogene Houston, MD;  Location: [facility];  Service: Endoscopy;  Laterality: N/A;  930  . Esophagogastroduodenoscopy  01/02/2012    Procedure: ESOPHAGOGASTRODUODENOSCOPY (EGD);  Surgeon: Rogene Houston, MD;  Location: [facility];  Service: Endoscopy;  Laterality: N/A;  100  . Esophageal biopsy  01/02/2012    Procedure: BIOPSY;  Surgeon: Rogene Houston, MD;  Location: [facility];  Service: Endoscopy;  Laterality: N/A;  . Esophagogastroduodenoscopy N/A 09/20/2012    Procedure:  ESOPHAGOGASTRODUODENOSCOPY (EGD);  Surgeon: Rogene Houston, MD;  Location: [facility];  Service: Endoscopy;  Laterality: N/A;  . Eus N/A 10/07/2012    Procedure: UPPER ENDOSCOPIC ULTRASOUND (EUS) LINEAR;  Surgeon: Milus Banister, MD;  Location: [facility];  Service: Endoscopy;  Laterality: N/A;  . Cardioversion N/A 10/13/2012    Procedure: CARDIOVERSION;  Surgeon: Yehuda Savannah, MD;  Location: [facility];  Service: Cardiovascular;  Laterality: N/A;  . Cardiac catheterization      cardiac stent  . Port-a-cath removal Left 12/07/2012    Procedure: REMOVAL PORT-A-CATH;  Surgeon: Scherry Ran, MD;  Location: [facility];  Service: General;  Laterality: Left;  . Portacath placement N/A 12/07/2012    Procedure: INSERTION PORT-A-CATH;  Surgeon: Scherry Ran, MD;  Location: [facility];  Service: General;  Laterality: N/A;  Attempted portacath placement on left and right side    Denies any headaches, dizziness, double vision, fevers, chills, night sweats, nausea, vomiting, diarrhea, constipation, chest pain, heart palpitations, shortness of breath, blood in stool, black tarry stool, urinary pain, urinary burning, urinary frequency, hematuria.   PHYSICAL EXAMINATION  ECOG PERFORMANCE STATUS: 0 - Asymptomatic  Filed Vitals:   08/24/13 1500  BP: 107/70  Pulse: 78  Temp: 97.8 F (36.6 C)  Resp: 20    GENERAL:alert, no distress, well nourished, well developed, comfortable, cooperative and obese SKIN: skin color, texture, turgor are normal, no rashes or significant lesions HEAD: Normocephalic, No masses, lesions, tenderness or abnormalities EYES: normal, PERRLA, EOMI, Conjunctiva are pink and non-injected EARS: External ears normal OROPHARYNX:mucous membranes are moist  NECK: supple, no adenopathy, thyroid normal size, non-tender, without nodularity, no stridor, non-tender, trachea midline LYMPH:  no palpable lymphadenopathy BREAST:not examined LUNGS: clear to auscultation  HEART: regular  rate & rhythm, no murmurs and no gallops ABDOMEN:abdomen soft, obese and normal bowel sounds BACK: Back symmetric, no curvature. EXTREMITIES:less then 2 second capillary refill, no joint deformities, effusion, or inflammation, no skin discoloration, no cyanosis  NEURO: alert & oriented x 3 with fluent speech, no focal motor/sensory deficits, gait normal   LABORATORY DATA: CBC    Component Value Date/Time   WBC 7.1 08/15/2013 1217   RBC 3.46* 08/15/2013 1217   HGB 10.7* 08/15/2013 1217   HCT 32.6* 08/15/2013 1217   PLT 146* 08/15/2013 1217   MCV 94.2 08/15/2013 1217   MCH 30.9 08/15/2013 1217   MCHC 32.8 08/15/2013 1217   RDW 16.9* 08/15/2013 1217   LYMPHSABS 2.2 08/15/2013 1217   MONOABS 0.6 08/15/2013 1217   EOSABS 0.5 08/15/2013 1217   BASOSABS 0.0 08/15/2013 1217      Chemistry      Component Value Date/Time   NA 142 08/15/2013 1217   K 3.4* 08/15/2013 1217   CL 109 08/15/2013 1217   CO2 21 08/15/2013 1217   BUN 27* 08/15/2013 1217   CREATININE 1.50* 08/15/2013 1217   CREATININE 2.24* 10/05/2012 1411      Component Value Date/Time   CALCIUM 6.8* 08/15/2013  1217   CALCIUM 7.1* 08/15/2013 1217   ALKPHOS 83 08/15/2013 1217   AST 44* 08/15/2013 1217   ALT 56* 08/15/2013 1217   BILITOT 0.5 08/15/2013 1217     Results for KEANE, MARTELLI (MRN [id number]) as of 08/22/2013 17:17  Ref. Range 08/15/2013 12:17  LDH Latest Range: 94-250 U/L 259 (H)    Results for QUAYSHAWN, NIN (MRN [id number]) as of 08/22/2013 17:17  Ref. Range 08/15/2013 12:17  Beta-2 Microglobulin Latest Range: <=2.51 mg/L 5.10 (H)  CRP Latest Range: <0.60 mg/dL <0.5 (L)  Albumin ELP Latest Range: 55.8-66.1 % 62.5  Alpha-1-Globulin Latest Range: 2.9-4.9 % 6.3 (H)  Alpha-2-Globulin Latest Range: 7.1-11.8 % 13.3 (H)  Beta Globulin Latest Range: 4.7-7.2 % 6.7  Beta 2 Latest Range: 3.2-6.5 % 3.5  Gamma Globulin Latest Range: 11.1-18.8 % 7.7 (L)  M-SPIKE, % No range found NOT DETECTED  SPE Interp. No range found (NOTE)    Comment No range found (NOTE)  IgG (Immunoglobin G), Serum Latest Range: [id number] mg/dL 358 (L)  IgA Latest Range: 68-379 mg/dL 52 (L)  IgM, Serum Latest Range: 41-251 mg/dL 45 (L)  Kappa free light chain Latest Range: 0.33-1.94 mg/dL 2.34 (H)  Lamda free light chains Latest Range: 0.57-2.63 mg/dL 1.78  Kappa, lamda light chain ratio Latest Range: 0.26-1.65  1.31    Results for ZELIG, GACEK (MRN [id number]) as of 08/22/2013 17:17  Ref. Range 08/15/2013 12:17  Sed Rate Latest Range: 0-16 mm/hr 19 (H)    Results for GABRYEL, FILES (MRN [id number]) as of 08/22/2013 17:17  Ref. Range 08/15/2013 12:17  PTH Latest Range: 14.0-72.0 pg/mL 557.5 (H)       ASSESSMENT:  1. Kappa light chain multiple myeloma, stage II disease presently on maintenance [name] 5 mg daily 7 days on, 7 days off along with weekly dexamethasone on Friday of every week. Zometa every 4 weeks.  2. Gastric ulcer, likely cause of epigastric abdominal pain, followed by GI.  3. Recent orchitis bilaterally status post multiple antibiotics, finally improved with IVIG  4. Diabetes mellitus  5. High-volume diarrhea followed by Dr. Laural Golden, suspected to be secondary to pancreatic steatorrhea.  6. Recurrent orchitis, S/P Cipro and IVIG on 11/10- 04/13/2013. Resolved  7. Hypogammaglobulinemia, secondary to multiple myeloma.  8. Hypocalcemia, on Oscal+D BID, work-up underway with an inappropriate PTH level  Patient Active Problem List   Diagnosis Date Noted  . Anemia, normocytic normochromic 10/09/2012  . Chronic pancreatitis 10/07/2012  . Atrial flutter 09/17/2012  . DDD (degenerative disc disease), cervical 03/18/2012  . Arteriosclerotic cardiovascular disease (ASCVD)   . Hypertension   . Hyperlipidemia   . Multiple myeloma 07/01/2011  . Morbid obesity 04/29/2010  . DIABETES MELLITUS, TYPE II, ON INSULIN 11/15/2008  . SLEEP APNEA 11/15/2008     PLAN:  1. I personally reviewed and went over laboratory results  with the patient.  2. Hold Zometa while undergoing work-up for hypocalcemia 3. Continue Lenalidomide 5 mg, 7 days on and 7 days off with 20 mg Dexamethasone BID every Friday only.  4. Labs every 6 weeks: CBC diff, CMET, CRP, B2M, LDH, MM panel  5. Continue with Kdur supplementation.  6. Labs today: Vit D level, calcium level 7. Urine tests: 24 hour urine collection for phosphorus, calcium, and protein 8. Return in 12 weeks for follow-up     THERAPY PLAN:  From a multiple myeloma standpoint, his disease is stable and we will focus on symptom management and depth of remission.  We  will work-up his hypocalcemia.   All questions were answered. The patient knows to call the clinic with any problems, questions or concerns. We can certainly see the patient much sooner if necessary.  Patient and plan discussed with Dr. Farrel Gobble and he is in agreement with the aforementioned.   KEFALAS,[name] 08/24/2013

## 2013-08-23 ENCOUNTER — Encounter (INDEPENDENT_AMBULATORY_CARE_PROVIDER_SITE_OTHER): Payer: Self-pay | Admitting: Internal Medicine

## 2013-08-23 ENCOUNTER — Ambulatory Visit (INDEPENDENT_AMBULATORY_CARE_PROVIDER_SITE_OTHER): Payer: Managed Care, Other (non HMO) | Admitting: Internal Medicine

## 2013-08-23 VITALS — BP 112/68 | HR 70 | Temp 97.2°F | Resp 18 | Ht 75.0 in | Wt 323.2 lb

## 2013-08-23 DIAGNOSIS — R197 Diarrhea, unspecified: Secondary | ICD-10-CM

## 2013-08-23 DIAGNOSIS — K903 Pancreatic steatorrhea: Secondary | ICD-10-CM

## 2013-08-23 NOTE — Patient Instructions (Addendum)
Please remember to ride stationary bike every day; goal is 30 minutes daily. Can take Lomotil or diphenoxylate 1 to 2 tablets at bedtime to prevent nocturnal diarrhea

## 2013-08-24 ENCOUNTER — Encounter (HOSPITAL_COMMUNITY): Payer: Self-pay | Admitting: Oncology

## 2013-08-24 ENCOUNTER — Encounter (HOSPITAL_BASED_OUTPATIENT_CLINIC_OR_DEPARTMENT_OTHER): Payer: Managed Care, Other (non HMO) | Admitting: Oncology

## 2013-08-24 VITALS — BP 107/70 | HR 78 | Temp 97.8°F | Resp 20 | Wt 331.8 lb

## 2013-08-24 DIAGNOSIS — C9000 Multiple myeloma not having achieved remission: Secondary | ICD-10-CM

## 2013-08-24 LAB — CALCIUM: Calcium: 7.8 mg/dL — ABNORMAL LOW (ref 8.4–10.5)

## 2013-08-24 NOTE — Progress Notes (Signed)
Presenting complaint;  Followup for steatorrhea/diarrhea.  Subjective:  Patient is 53-year-old African American male who is here for scheduled visit. He was last seen 6 months ago. He feels about the same. Most of his stools or loose to semi-formed. However over the weekend he had normal stool which is the case every now and then. He generally has 2 or 3 stools per day. He has intermittent nocturnal bowel movement. He's not sure if he is taking Lomotil on a regular basis. He says his right pelvis some of his medications. He has occasional bloating and epigastric pain but not like he used to have before. His appetite is good. His weight has been stable. He says his blood glucose levels run high particularly when he is on Decadron. He is concerned about elevated glucose levels and he states he will talk with Dr. Hale about referral to Dr. Nida. He states he remains in remission as far as multiple myeloma is concerned. He has an appointment oncologist near future. He remains on [name] week on and week off. He works at least 50 hours a week. He says he is using stationary bike for exercise but not on regular basis and he only does 10 minutes at a time.   Current Medications: Outpatient Encounter Prescriptions as of 08/23/2013  Medication Sig  . acyclovir (ZOVIRAX) 400 MG tablet TAKE ONE TABLET BY MOUTH EVERY MORNING.  . allopurinol (ZYLOPRIM) 300 MG tablet Take 1 tablet (300 mg total) by mouth daily.  . atorvastatin (LIPITOR) 80 MG tablet Take 1 tablet (80 mg total) by mouth at bedtime.  . calcium-vitamin D (OSCAL WITH D) 500-200 MG-UNIT per tablet Take 1 tablet by mouth 2 (two) times daily.   . carvedilol (COREG) 3.125 MG tablet TAKE ONE TABLET BY MOUTH 2 TIMES A DAY.  . Cholecalciferol (VITAMIN D) 2000 UNITS CAPS Take 1 capsule by mouth every morning.   . CREON 36000 UNITS CPEP TAKE 3 CAPSULES BY MOUTH DAILY WITH MEALS AND 1 CAPSULES WITH SNACKS.  . dexamethasone (DECADRON) 4 MG tablet Take 10  tablets (40 mg total) by mouth once a week. Takes 5 tabs bid every Friday.  . dexlansoprazole (DEXILANT) 60 MG capsule Take 60 mg by mouth every morning.   . diphenoxylate-atropine (LOMOTIL) 2.5-0.025 MG per tablet TAKE 2 TABS BY MOUTH EVERY MORNING. MAX 8 A DAY.  . HYDROcodone-acetaminophen (NORCO/VICODIN) 5-325 MG per tablet Take 1 tablet by mouth every 4 (four) hours as needed for pain.   . insulin detemir (LEVEMIR) 100 UNIT/ML injection Inject 40 Units into the skin every morning.   . insulin lispro (HUMALOG) 100 UNIT/ML injection Inject into the skin 2 (two) times daily with a meal. Sliding Scale per patient  . lenalidomide ([name]) 5 MG capsule Take 1 tablet PO 7 days on and 7 days off.  . lidocaine-prilocaine (EMLA) cream Apply 1 application topically as needed (for port-a-cath access).   . loratadine (CLARITIN) 10 MG tablet Take 10 mg by mouth every morning.   . Multiple Vitamins-Minerals (MULTIVITAMINS THER. W/MINERALS) TABS Take 1 tablet by mouth every morning.   . nitroGLYCERIN (NITROSTAT) 0.4 MG SL tablet Place 1 tablet (0.4 mg total) under the tongue every 5 (five) minutes as needed for chest pain.  . ondansetron (ZOFRAN) 8 MG tablet Take 1 tablet (8 mg total) by mouth every 4 (four) hours as needed for nausea.  . oxyCODONE-acetaminophen (PERCOCET) 5-325 MG per tablet Take 2 tablets by mouth every 4 (four) hours as needed for pain.  .   potassium chloride SA (K-DUR,KLOR-CON) 20 MEQ tablet Take 2 tablets (40 mEq total) by mouth daily.  Marland Kitchen sulfamethoxazole-trimethoprim (BACTRIM DS) 800-160 MG per tablet Take 1 tablet by mouth every Monday, Wednesday, and Friday.  . torsemide (DEMADEX) 20 MG tablet Take 20 mg by mouth daily as needed (for fluid retention).   Alveda Reasons 20 MG TABS tablet TAKE 1 TABLET BY MOUTH DAILY.  Marland Kitchen Zoledronic Acid (ZOMETA IV) Inject 2 mg into the vein every 28 (twenty-eight) days.   . [DISCONTINUED] insulin aspart (NOVOLOG) 100 UNIT/ML injection Inject 5-20 Units into  the skin 3 (three) times daily before meals. Sliding scale     Objective: Blood pressure 112/68, pulse 70, temperature 97.2 F (36.2 C), temperature source Oral, resp. rate 18, height 6' 3" (1.905 m), weight 323 lb 3.2 oz (146.603 kg). Patient is alert and in no acute distress. Conjunctiva is pink. Sclera is nonicteric Oropharyngeal mucosa is normal. No neck masses or thyromegaly noted. Cardiac exam with regular rhythm normal S1 and S2. No murmur or gallop noted. Lungs are clear to auscultation. Abdomen is obese. Bowel sounds are normal. On palpation it is soft and nontender without organomegaly or masses to  He has 1+ pitting edema around his ankles.  Labs/studies Results: Lab data from 08/15/2013. WBC 7.1, H&H 10.7 and 32.6 and platelet count 146K. BUN 27, creatinine 1.50 Bilirubin oh 0.5, AP 83, AST 44, ALT 56 and albumin 3.1   Assessment:  #1. Exocrine pancreatic insufficiency. Overall he is doing better with pancreatic enzyme supplements and PPI. He may have another reason for his diarrhea in addition to pancreatic insufficiency as he has diarrhea on most days. However he is maintaining his weight and therefore not making changes in his medications but he could take Lomotil every night so that he would not have nocturnal bowel movement. #2. Mildly elevated transaminases. Hepatitis B surface antigen and hepatitis C virus and biopsy were negative. Mild transaminitis felt to be secondary to fatty liver. He is clearly at risk for progressive liver disease given that he is diabetic and control is suboptimal.    Plan:  Can take Lomotil or diphenoxylate one to 2 tablets at bedtime as needed. Importance of daily or regular exercises stressed to the patient. Office visit in 6 months.

## 2013-08-24 NOTE — Progress Notes (Signed)
Labs drawn from right ac. Pt tolerated well

## 2013-08-24 NOTE — Patient Instructions (Signed)
[facility] Discharge Instructions  RECOMMENDATIONS MADE BY THE CONSULTANT AND ANY TEST RESULTS WILL BE SENT TO YOUR REFERRING PHYSICIAN.  Follow up with the clinic as scheduled.   Thank you for choosing [facility] to provide your oncology and hematology care.  To afford each patient quality time with our providers, please arrive at least 15 minutes before your scheduled appointment time.  With your help, our goal is to use those 15 minutes to complete the necessary work-up to ensure our physicians have the information they need to help with your evaluation and healthcare recommendations.    Effective January 1st, 2014, we ask that you re-schedule your appointment with our physicians should you arrive 10 or more minutes late for your appointment.  We strive to give you quality time with our providers, and arriving late affects you and other patients whose appointments are after yours.    Again, thank you for choosing [facility].  Our hope is that these requests will decrease the amount of time that you wait before being seen by our physicians.       _____________________________________________________________  Should you have questions after your visit to [facility], please contact our office at [phone] between the hours of 8:30 a.m. and 5:00 p.m.  Voicemails left after 4:30 p.m. will not be returned until the following business day.  For prescription refill requests, have your pharmacy contact our office with your prescription refill request.

## 2013-08-25 ENCOUNTER — Other Ambulatory Visit (HOSPITAL_COMMUNITY): Payer: Self-pay | Admitting: Oncology

## 2013-08-25 DIAGNOSIS — E559 Vitamin D deficiency, unspecified: Secondary | ICD-10-CM

## 2013-08-25 LAB — VITAMIN D 25 HYDROXY (VIT D DEFICIENCY, FRACTURES): Vit D, 25-Hydroxy: 21 ng/mL — ABNORMAL LOW (ref 30–89)

## 2013-08-25 MED ORDER — ERGOCALCIFEROL 1.25 MG (50000 UT) PO CAPS: ORAL_CAPSULE | ORAL | Status: DC

## 2013-08-29 ENCOUNTER — Other Ambulatory Visit (HOSPITAL_COMMUNITY): Payer: Self-pay | Admitting: Oncology

## 2013-08-29 DIAGNOSIS — C9000 Multiple myeloma not having achieved remission: Secondary | ICD-10-CM

## 2013-08-29 DIAGNOSIS — E213 Hyperparathyroidism, unspecified: Secondary | ICD-10-CM

## 2013-08-29 LAB — PHOSPHORUS, URINE, TIMED
Collection Interval-UPO4: 24 hours
Phosphorus, 24H Ur: 459 mg/d — ABNORMAL LOW (ref 300–1300)
Phosphorus, Ur: 18 mg/dL
Urine Total Volume-UPO4: 2550 mL

## 2013-08-29 LAB — PROTEIN, URINE, 24 HOUR
Collection Interval-UPROT: 24 hours
Protein, 24H Urine: 1046 mg/d — ABNORMAL HIGH (ref 50–100)
Protein, Urine: 41 mg/dL
Urine Total Volume-UPROT: 2550 mL

## 2013-08-29 LAB — CALCIUM, URINE, 24 HOUR
Calcium, 24 hour urine: 153 mg/d (ref 100–250)
Calcium, Ur: 6 mg/dL
Urine Total Volume-UCA24: 2550 mL

## 2013-08-29 NOTE — Progress Notes (Signed)
Chart accessed to release urine orders

## 2013-08-30 ENCOUNTER — Other Ambulatory Visit (HOSPITAL_COMMUNITY): Payer: Self-pay | Admitting: Oncology

## 2013-08-30 ENCOUNTER — Telehealth: Payer: Self-pay | Admitting: Adult Health

## 2013-08-30 DIAGNOSIS — C9000 Multiple myeloma not having achieved remission: Secondary | ICD-10-CM

## 2013-08-30 MED ORDER — ALLOPURINOL 300 MG PO TABS: 300.0000 mg | ORAL_TABLET | Freq: Every day | ORAL | Status: DC

## 2013-08-30 MED ORDER — ACYCLOVIR 400 MG PO TABS: 400.0000 mg | ORAL_TABLET | Freq: Every day | ORAL | Status: DC

## 2013-08-30 MED ORDER — ATORVASTATIN CALCIUM 80 MG PO TABS: 80.0000 mg | ORAL_TABLET | Freq: Every day | ORAL | Status: DC

## 2013-08-30 NOTE — Telephone Encounter (Signed)
Received fax refill request  Rx # [id number] Medication:  Atorvastatin 80 mg tablet  Qty 30 Sig:  Take one tablet by mouth at bedtime  Physician:  Purcell Nails

## 2013-08-30 NOTE — Telephone Encounter (Signed)
Medication sent via escribe. KP, LPN sent refill request with note to schedule an appointment. Still no appt made for pt. Left TS a message to call pt to make an appt. Pt only sent #[phone].

## 2013-09-07 ENCOUNTER — Other Ambulatory Visit (HOSPITAL_COMMUNITY): Payer: Self-pay | Admitting: Oncology

## 2013-09-07 DIAGNOSIS — C9000 Multiple myeloma not having achieved remission: Secondary | ICD-10-CM

## 2013-09-07 MED ORDER — LENALIDOMIDE 5 MG PO CAPS: ORAL_CAPSULE | ORAL | Status: DC

## 2013-09-12 ENCOUNTER — Ambulatory Visit (HOSPITAL_COMMUNITY): Payer: Managed Care, Other (non HMO)

## 2013-09-13 ENCOUNTER — Encounter (HOSPITAL_COMMUNITY): Payer: Managed Care, Other (non HMO) | Attending: Oncology

## 2013-09-13 DIAGNOSIS — X58XXXA Exposure to other specified factors, initial encounter: Secondary | ICD-10-CM | POA: Insufficient documentation

## 2013-09-13 DIAGNOSIS — E876 Hypokalemia: Secondary | ICD-10-CM | POA: Insufficient documentation

## 2013-09-13 DIAGNOSIS — Z452 Encounter for adjustment and management of vascular access device: Secondary | ICD-10-CM

## 2013-09-13 DIAGNOSIS — C9000 Multiple myeloma not having achieved remission: Secondary | ICD-10-CM | POA: Insufficient documentation

## 2013-09-13 DIAGNOSIS — Z95828 Presence of other vascular implants and grafts: Secondary | ICD-10-CM

## 2013-09-13 LAB — CBC WITH DIFFERENTIAL/PLATELET
Basophils Absolute: 0 10*3/uL (ref 0.0–0.1)
Basophils Relative: 0 % (ref 0–1)
Eosinophils Absolute: 0.3 10*3/uL (ref 0.0–0.7)
Eosinophils Relative: 4 % (ref 0–5)
HCT: 33.3 % — ABNORMAL LOW (ref 39.0–52.0)
Hemoglobin: 11.1 g/dL — ABNORMAL LOW (ref 13.0–17.0)
Lymphocytes Relative: 24 % (ref 12–46)
Lymphs Abs: 1.5 10*3/uL (ref 0.7–4.0)
MCH: 31.6 pg (ref 26.0–34.0)
MCHC: 33.3 g/dL (ref 30.0–36.0)
MCV: 94.9 fL (ref 78.0–100.0)
Monocytes Absolute: 0.6 10*3/uL (ref 0.1–1.0)
Monocytes Relative: 9 % (ref 3–12)
Neutro Abs: 4 10*3/uL (ref 1.7–7.7)
Neutrophils Relative %: 63 % (ref 43–77)
Platelets: 146 10*3/uL — ABNORMAL LOW (ref 150–400)
RBC: 3.51 MIL/uL — ABNORMAL LOW (ref 4.22–5.81)
RDW: 16 % — ABNORMAL HIGH (ref 11.5–15.5)
WBC: 6.4 10*3/uL (ref 4.0–10.5)

## 2013-09-13 LAB — COMPREHENSIVE METABOLIC PANEL
ALT: 32 U/L (ref 0–53)
AST: 25 U/L (ref 0–37)
Albumin: 3.2 g/dL — ABNORMAL LOW (ref 3.5–5.2)
Alkaline Phosphatase: 83 U/L (ref 39–117)
BUN: 31 mg/dL — ABNORMAL HIGH (ref 6–23)
CO2: 21 mEq/L (ref 19–32)
Calcium: 7.8 mg/dL — ABNORMAL LOW (ref 8.4–10.5)
Chloride: 110 mEq/L (ref 96–112)
Creatinine, Ser: 1.68 mg/dL — ABNORMAL HIGH (ref 0.50–1.35)
GFR calc Af Amer: 52 mL/min — ABNORMAL LOW (ref >90)
GFR calc non Af Amer: 45 mL/min — ABNORMAL LOW (ref >90)
Glucose, Bld: 140 mg/dL — ABNORMAL HIGH (ref 70–99)
Potassium: 3.6 mEq/L — ABNORMAL LOW (ref 3.7–5.3)
Sodium: 144 mEq/L (ref 137–147)
Total Bilirubin: 0.6 mg/dL (ref 0.3–1.2)
Total Protein: 5.9 g/dL — ABNORMAL LOW (ref 6.0–8.3)

## 2013-09-13 LAB — C-REACTIVE PROTEIN: CRP: <0.5 mg/dL — ABNORMAL LOW (ref <0.60)

## 2013-09-13 LAB — SEDIMENTATION RATE: Sed Rate: 22 mm/hr — ABNORMAL HIGH (ref 0–16)

## 2013-09-13 LAB — LACTATE DEHYDROGENASE: LDH: 260 U/L — ABNORMAL HIGH (ref 94–250)

## 2013-09-13 MED ORDER — SODIUM CHLORIDE 0.9 % IJ SOLN
10.0000 mL | INTRAMUSCULAR | Status: DC | PRN
  Administered 2013-09-13: 10 mL via INTRAVENOUS

## 2013-09-13 MED ORDER — HEPARIN SOD (PORK) LOCK FLUSH 100 UNIT/ML IV SOLN
500.0000 [IU] | Freq: Once | INTRAVENOUS | Status: AC
  Administered 2013-09-13: 500 [IU] via INTRAVENOUS

## 2013-09-13 MED ORDER — POTASSIUM CHLORIDE CRYS ER 20 MEQ PO TBCR: 40.0000 meq | EXTENDED_RELEASE_TABLET | Freq: Two times a day (BID) | ORAL | Status: DC

## 2013-09-13 MED ORDER — HEPARIN SOD (PORK) LOCK FLUSH 100 UNIT/ML IV SOLN
INTRAVENOUS | Status: AC
  Filled 2013-09-13: qty 5

## 2013-09-13 NOTE — Progress Notes (Signed)
[name] presented for Portacath access and flush. Proper placement of portacath confirmed by CXR. Portacath located right chest wall accessed with  H 20 needle. Good blood return present. Portacath flushed with 61ml NS and 500U/55ml Heparin and needle removed intact. Procedure without incident. Patient tolerated procedure well.  Zometa held again today due to Calcium level 7.8. [name], PA discussed with pt. Patient also instructed to continue potassium 20 meq 2 tablets two times daily. RTC in 1 month for labs +/- Zometa. Patient verbalized understanding.

## 2013-09-14 LAB — KAPPA/LAMBDA LIGHT CHAINS
Kappa free light chain: 2.64 mg/dL — ABNORMAL HIGH (ref 0.33–1.94)
Kappa, lambda light chain ratio: 1.29 (ref 0.26–1.65)
Lambda free light chains: 2.05 mg/dL (ref 0.57–2.63)

## 2013-09-15 LAB — MULTIPLE MYELOMA PANEL, SERUM
Albumin ELP: 62 % (ref 55.8–66.1)
Alpha-1-Globulin: 6.7 % — ABNORMAL HIGH (ref 2.9–4.9)
Alpha-2-Globulin: 12.2 % — ABNORMAL HIGH (ref 7.1–11.8)
Beta 2: 4.7 % (ref 3.2–6.5)
Beta Globulin: 7 % (ref 4.7–7.2)
Gamma Globulin: 7.4 % — ABNORMAL LOW (ref 11.1–18.8)
IgA: 58 mg/dL — ABNORMAL LOW (ref 68–379)
IgG (Immunoglobin G), Serum: 390 mg/dL — ABNORMAL LOW (ref 650–1600)
IgM, Serum: 49 mg/dL — ABNORMAL LOW (ref 41–251)
M-Spike, %: NOT DETECTED g/dL
Total Protein: 5.4 g/dL — ABNORMAL LOW (ref 6.0–8.3)

## 2013-09-15 LAB — BETA 2 MICROGLOBULIN, SERUM: Beta-2 Microglobulin: 3.59 mg/L — ABNORMAL HIGH (ref <=2.51)

## 2013-09-20 ENCOUNTER — Telehealth: Payer: Self-pay | Admitting: *Deleted

## 2013-09-20 NOTE — Telephone Encounter (Signed)
ATORVASTATIN 80 MG 15 TAB

## 2013-09-27 ENCOUNTER — Telehealth: Payer: Self-pay | Admitting: *Deleted

## 2013-09-27 MED ORDER — ATORVASTATIN CALCIUM 80 MG PO TABS: 80.0000 mg | ORAL_TABLET | Freq: Every day | ORAL | Status: DC

## 2013-09-27 NOTE — Telephone Encounter (Signed)
Medication sent via escribe.  

## 2013-09-27 NOTE — Telephone Encounter (Signed)
Message copied by Truett Mainland on Tue Sep 27, 2013  2:58 PM ------      Message from: Lestine Mount      Created: Tue Sep 27, 2013  2:40 PM      Regarding: RX REFILL       PT SCHEDULED APPT CAN WE CALL RX IN FOR ATORVASTATIN [facility] ------

## 2013-10-05 ENCOUNTER — Telehealth (HOSPITAL_COMMUNITY): Payer: Self-pay | Admitting: Dietician

## 2013-10-05 NOTE — Telephone Encounter (Signed)
Received voicemail left by pt at [phone]. Pt with questions concerning hs diet.

## 2013-10-07 NOTE — Telephone Encounter (Signed)
Called at 1513. Pt requests call back next week. He wants to set up appointment; will need to be after 1515.

## 2013-10-11 ENCOUNTER — Ambulatory Visit (HOSPITAL_COMMUNITY): Payer: Managed Care, Other (non HMO)

## 2013-10-11 ENCOUNTER — Other Ambulatory Visit (HOSPITAL_COMMUNITY): Payer: Self-pay | Admitting: Oncology

## 2013-10-11 DIAGNOSIS — C9000 Multiple myeloma not having achieved remission: Secondary | ICD-10-CM

## 2013-10-11 MED ORDER — LENALIDOMIDE 5 MG PO CAPS: ORAL_CAPSULE | ORAL | Status: DC

## 2013-10-11 NOTE — Telephone Encounter (Signed)
Called back at 1204. Appointment scheduled for 10/25/13 at 1530.

## 2013-10-12 ENCOUNTER — Encounter: Payer: Self-pay | Admitting: Cardiovascular Disease

## 2013-10-12 ENCOUNTER — Ambulatory Visit (INDEPENDENT_AMBULATORY_CARE_PROVIDER_SITE_OTHER): Payer: Managed Care, Other (non HMO) | Admitting: Cardiovascular Disease

## 2013-10-12 VITALS — BP 94/50 | HR 50 | Ht 77.0 in | Wt 313.0 lb

## 2013-10-12 DIAGNOSIS — I5189 Other ill-defined heart diseases: Secondary | ICD-10-CM

## 2013-10-12 DIAGNOSIS — C9000 Multiple myeloma not having achieved remission: Secondary | ICD-10-CM

## 2013-10-12 DIAGNOSIS — E782 Mixed hyperlipidemia: Secondary | ICD-10-CM

## 2013-10-12 DIAGNOSIS — N289 Disorder of kidney and ureter, unspecified: Secondary | ICD-10-CM

## 2013-10-12 DIAGNOSIS — I10 Essential (primary) hypertension: Secondary | ICD-10-CM

## 2013-10-12 DIAGNOSIS — I4892 Unspecified atrial flutter: Secondary | ICD-10-CM

## 2013-10-12 DIAGNOSIS — I7090 Unspecified atherosclerosis: Secondary | ICD-10-CM

## 2013-10-12 DIAGNOSIS — I255 Ischemic cardiomyopathy: Secondary | ICD-10-CM

## 2013-10-12 DIAGNOSIS — I519 Heart disease, unspecified: Secondary | ICD-10-CM

## 2013-10-12 DIAGNOSIS — I2589 Other forms of chronic ischemic heart disease: Secondary | ICD-10-CM

## 2013-10-12 DIAGNOSIS — R001 Bradycardia, unspecified: Secondary | ICD-10-CM

## 2013-10-12 DIAGNOSIS — I5022 Chronic systolic (congestive) heart failure: Secondary | ICD-10-CM

## 2013-10-12 DIAGNOSIS — I2510 Atherosclerotic heart disease of native coronary artery without angina pectoris: Secondary | ICD-10-CM

## 2013-10-12 DIAGNOSIS — I498 Other specified cardiac arrhythmias: Secondary | ICD-10-CM

## 2013-10-12 MED ORDER — METOPROLOL SUCCINATE ER 25 MG PO TB24: 25.0000 mg | ORAL_TABLET | Freq: Every day | ORAL | Status: DC

## 2013-10-12 NOTE — Progress Notes (Signed)
Patient ID: [name], male   DOB: Oct 08, 1960, 53 y.o.   MRN: [id number]      SUBJECTIVE: The patient is a 53 year old male with a past medical history significant for coronary artery disease with prior myocardial infarction and stent placement, ischemic cardiomyopathy and chronic systolic heart failure with an echocardiogram in April 2014 demonstrating severe left ventricular systolic dysfunction, EF 71%. He also has atrial flutter and required cardioversion in May 2014. He also has multiple myeloma, insulin-dependent diabetes mellitus, chronic kidney disease, sleep apnea, hypertension, and hyperlipidemia.  Overall he has been feeling quite well. He takes torsemide as needed for leg swelling, and this only occurs if he has been on his feet for prolonged periods of time. He was doing some modified pushups against a wall the other day, and he had some mild bilateral chest tightness and thinks it was from this. He sleeps with 3 pillows and his legs elevated and says he does this for comfort. He uses CPAP at night. He denies paroxysmal nocturnal dyspnea. He has been feeling a little fatigued. His heart rate was initially 50 beats per minute with a blood pressure of 94/50.   ECG in the office today demonstrates normal sinus rhythm with sinus arrhythmia, a diffuse nonspecific ST segment and T wave abnormality, heart rate 66 beats per minute.  4/14 labs: BUN 31, creat 1.68, GFR 52 cc/min.  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   No Known Allergies  Current Outpatient Prescriptions  Medication Sig Dispense Refill  . acyclovir (ZOVIRAX) 400 MG tablet Take 1 tablet (400 mg total) by mouth daily.  30 tablet  3  . allopurinol (ZYLOPRIM) 300 MG tablet Take 1 tablet (300 mg total) by mouth daily.  30 tablet  3  . atorvastatin (LIPITOR) 80 MG tablet Take 1 tablet (80 mg total) by mouth at bedtime.  30 tablet  1  . calcium-vitamin D (OSCAL WITH D)  500-200 MG-UNIT per tablet Take 1 tablet by mouth 2 (two) times daily.       . carvedilol (COREG) 3.125 MG tablet TAKE ONE TABLET BY MOUTH 2 TIMES A DAY.  60 tablet  3  . CREON 36000 UNITS CPEP TAKE 3 CAPSULES BY MOUTH DAILY WITH MEALS AND 1 CAPSULES WITH SNACKS.  165 capsule  11  . dexamethasone (DECADRON) 4 MG tablet Take 10 tablets (40 mg total) by mouth once a week. Takes 5 tabs bid every Friday.  40 tablet  5  . dexlansoprazole (DEXILANT) 60 MG capsule Take 60 mg by mouth every morning.       . diphenoxylate-atropine (LOMOTIL) 2.5-0.025 MG per tablet TAKE 2 TABS BY MOUTH EVERY MORNING. MAX 8 A DAY.  120 tablet  0  . ergocalciferol (VITAMIN D2) 50000 UNITS capsule 50,000 units weekly x 4 weeks and then 50,000 units monthly  20 capsule  1  . HYDROcodone-acetaminophen (NORCO/VICODIN) 5-325 MG per tablet Take 1 tablet by mouth every 4 (four) hours as needed for pain.       Marland Kitchen insulin detemir (LEVEMIR) 100 UNIT/ML injection Inject 40 Units into the skin every morning.       . insulin lispro (HUMALOG) 100 UNIT/ML injection Inject into the skin 2 (two) times daily with a meal. Sliding Scale per patient      . lenalidomide ([name]) 5 MG capsule Take 1 tablet PO 7 days on and 7 days off.  14 capsule  0  . lidocaine-prilocaine (  EMLA) cream Apply 1 application topically as needed (for port-a-cath access).       Marland Kitchen loratadine (CLARITIN) 10 MG tablet Take 10 mg by mouth every morning.       . Multiple Vitamins-Minerals (MULTIVITAMINS THER. W/MINERALS) TABS Take 1 tablet by mouth every morning.       . nitroGLYCERIN (NITROSTAT) 0.4 MG SL tablet Place 1 tablet (0.4 mg total) under the tongue every 5 (five) minutes as needed for chest pain.  25 tablet  3  . ondansetron (ZOFRAN) 8 MG tablet Take 1 tablet (8 mg total) by mouth every 4 (four) hours as needed for nausea.  15 tablet  0  . oxyCODONE-acetaminophen (PERCOCET) 5-325 MG per tablet Take 2 tablets by mouth every 4 (four) hours as needed for pain.  30  tablet  0  . potassium chloride SA (K-DUR,KLOR-CON) 20 MEQ tablet Take 2 tablets (40 mEq total) by mouth 2 (two) times daily.  120 tablet  2  . sulfamethoxazole-trimethoprim (BACTRIM DS) 800-160 MG per tablet Take 1 tablet by mouth every Monday, Wednesday, and Friday.  12 tablet  5  . torsemide (DEMADEX) 20 MG tablet Take 20 mg by mouth daily as needed (for fluid retention).       Alveda Reasons 20 MG TABS tablet TAKE 1 TABLET BY MOUTH DAILY.  90 tablet  3  . Zoledronic Acid (ZOMETA IV) Inject 2 mg into the vein every 28 (twenty-eight) days.        No current facility-administered medications for this visit.    Past Medical History  Diagnosis Date  . Arteriosclerotic cardiovascular disease (ASCVD)     MI-2000s; stent to the proximal LAD and diagonal in 2001; stress nuclear in 2008-impaired exercise capacity, left ventricular dilatation, moderately to severely depressed EF, apical, inferior and anteroseptal scar  . Hypertension   . 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 Diabetes mellitus     Insulin  . GERD (gastroesophageal reflux disease)   . Pedal edema     Venous insufficiency  . Obesity   . Hyperlipidemia   . Injection site reaction   . Cellulitis of leg     both legs  . Chronic kidney disease, stage 3, mod decreased GFR     Creatinine of 1.84 in 06/2011 and 1.5 in 07/2011  . Chronic diarrhea   . Sleep apnea     uses cpap  . Ulcer   . Atrial flutter   . Multiple myeloma 07/01/2011  . Myocardial infarction 2000  . Gout     Past Surgical History  Procedure Laterality Date  . Laparoscopic gastric banding  2006    has been removed  . Wrist surgery      Left; removal of bone fragment  . Incision and drainage abscess anal    . Abscess drainage      Scrotal  . Bone marrow biopsy  05/13/11  . Portacath placement  07/07/2011    Procedure: INSERTION PORT-A-CATH;  Surgeon: Scherry Ran, MD;  Location: [facility];  Service: General;  Laterality: N/A;  . Colonoscopy  11/28/2011     Procedure: COLONOSCOPY;  Surgeon: Rogene Houston, MD;  Location: [facility];  Service: Endoscopy;  Laterality: N/A;  930  . Esophagogastroduodenoscopy  01/02/2012    Procedure: ESOPHAGOGASTRODUODENOSCOPY (EGD);  Surgeon: Rogene Houston, MD;  Location: [facility];  Service: Endoscopy;  Laterality: N/A;  100  . Esophageal biopsy  01/02/2012    Procedure: BIOPSY;  Surgeon: Rogene Houston, MD;  Location: [facility];  Service: Endoscopy;  Laterality: N/A;  . Esophagogastroduodenoscopy N/A 09/20/2012    Procedure: ESOPHAGOGASTRODUODENOSCOPY (EGD);  Surgeon: Rogene Houston, MD;  Location: [facility];  Service: Endoscopy;  Laterality: N/A;  . Eus N/A 10/07/2012    Procedure: UPPER ENDOSCOPIC ULTRASOUND (EUS) LINEAR;  Surgeon: Milus Banister, MD;  Location: [facility];  Service: Endoscopy;  Laterality: N/A;  . Cardioversion N/A 10/13/2012    Procedure: CARDIOVERSION;  Surgeon: Yehuda Savannah, MD;  Location: [facility];  Service: Cardiovascular;  Laterality: N/A;  . Cardiac catheterization      cardiac stent  . Port-a-cath removal Left 12/07/2012    Procedure: REMOVAL PORT-A-CATH;  Surgeon: Scherry Ran, MD;  Location: [facility];  Service: General;  Laterality: Left;  . Portacath placement N/A 12/07/2012    Procedure: INSERTION PORT-A-CATH;  Surgeon: Scherry Ran, MD;  Location: [facility];  Service: General;  Laterality: N/A;  Attempted portacath placement on left and right side    History   Social History  . Marital Status: Married    Spouse Name: N/A    Number of Children: N/A  . Years of Education: 16   Occupational History  . Nurse, learning disability   . Funeral Director    Social History Main Topics  . Smoking status: Former Smoker -- 1 years    Types: Cigarettes, Cigars    Quit date: 05/17/2001  . Smokeless tobacco: Never Used  . Alcohol Use: No  . Drug Use: No  . Sexual Activity: Yes    Birth Control/ Protection: None   Other Topics Concern  . Not on file   Social History  Narrative  . No narrative on file     Filed Vitals:   10/12/13 1149  BP: 94/50  Pulse: 50  Height: '6\' 5"'  (1.956 m)  Weight: 313 lb (141.976 kg)    PHYSICAL EXAM General: NAD Neck: No JVD, no thyromegaly. Lungs: Clear to auscultation bilaterally with normal respiratory effort. CV: Nondisplaced PMI.  Regular rate and rhythm, normal S1/S2, no S3/S4, no murmur. No pretibial or periankle edema.  No carotid bruit.  Normal pedal pulses.  Abdomen: Soft, nontender, no hepatosplenomegaly, no distention.  Neurologic: Alert and oriented x 3.  Psych: Normal affect. Extremities: No clubbing or cyanosis.   ECG: reviewed and available in electronic records.      ASSESSMENT AND PLAN: 1. CAD with prior MI and stents: Symptomatically stable. He is currently on carvedilol 3.125 mg twice daily. His heart rate was initially 50 beats per minute with a blood pressure of 94/50, and he has felt mildly fatigued. I will switch carvedilol to Toprol-XL 25 mg daily. Continue high dose statin therapy with Lipitor 80 mg daily. 2. Ischemic cardiomyopathy/chronic systolic heart failure: Currently compensated. Has not had to use torsemide recently. EF 25% by echo in April 2014. I will repeat an echocardiogram to determine if he is a candidate for ICD therapy. He is presumably not on an ACE inhibitor or ARB due to chronic kidney disease. 3. HTN: He is currently mildly hypotensive. I am switching him from carvedilol to Toprol-XL. 4. Atrial flutter: Currently in normal sinus rhythm. No bleeding problems with Xarelto. 5. Hyperlipidemia: I will check a lipid panel and LFTs at his next visit. 6. CKD: Currently stable with results as noted above. 7. Sleep apnea: Uses CPAP daily. 8. Multiple myeloma: Followed by Hem-Onc.  Dispo: f/u 2-3 months.  Kate Sable, M.D., F.A.C.C.

## 2013-10-12 NOTE — Patient Instructions (Signed)
Your physician recommends that you schedule a follow-up appointment in: 2 months with Dr Bronson Ing   Your physician has requested that you have an echocardiogram. Echocardiography is a painless test that uses sound waves to create images of your heart. It provides your doctor with information about the size and shape of your heart and how well your heart's chambers and valves are working. This procedure takes approximately one hour. There are no restrictions for this procedure.   Your physician has recommended you make the following change in your medication:   STOP COREG  START Toprol XL 25 mg daily

## 2013-10-13 ENCOUNTER — Encounter (HOSPITAL_COMMUNITY): Payer: Managed Care, Other (non HMO) | Attending: Oncology

## 2013-10-13 DIAGNOSIS — C9000 Multiple myeloma not having achieved remission: Secondary | ICD-10-CM | POA: Insufficient documentation

## 2013-10-13 DIAGNOSIS — X58XXXA Exposure to other specified factors, initial encounter: Secondary | ICD-10-CM | POA: Insufficient documentation

## 2013-10-13 LAB — CBC WITH DIFFERENTIAL/PLATELET
Basophils Absolute: 0 10*3/uL (ref 0.0–0.1)
Basophils Relative: 0 % (ref 0–1)
Eosinophils Absolute: 0.4 10*3/uL (ref 0.0–0.7)
Eosinophils Relative: 6 % — ABNORMAL HIGH (ref 0–5)
HCT: 31.1 % — ABNORMAL LOW (ref 39.0–52.0)
Hemoglobin: 10.2 g/dL — ABNORMAL LOW (ref 13.0–17.0)
Lymphocytes Relative: 25 % (ref 12–46)
Lymphs Abs: 1.6 10*3/uL (ref 0.7–4.0)
MCH: 30.5 pg (ref 26.0–34.0)
MCHC: 32.8 g/dL (ref 30.0–36.0)
MCV: 93.1 fL (ref 78.0–100.0)
Monocytes Absolute: 0.8 10*3/uL (ref 0.1–1.0)
Monocytes Relative: 13 % — ABNORMAL HIGH (ref 3–12)
Neutro Abs: 3.5 10*3/uL (ref 1.7–7.7)
Neutrophils Relative %: 56 % (ref 43–77)
Platelets: 151 10*3/uL (ref 150–400)
RBC: 3.34 MIL/uL — ABNORMAL LOW (ref 4.22–5.81)
RDW: 15.2 % (ref 11.5–15.5)
WBC: 6.3 10*3/uL (ref 4.0–10.5)

## 2013-10-13 LAB — COMPREHENSIVE METABOLIC PANEL
ALT: 41 U/L (ref 0–53)
AST: 35 U/L (ref 0–37)
Albumin: 2.9 g/dL — ABNORMAL LOW (ref 3.5–5.2)
Alkaline Phosphatase: 97 U/L (ref 39–117)
BUN: 21 mg/dL (ref 6–23)
CO2: 17 mEq/L — ABNORMAL LOW (ref 19–32)
Calcium: 8.5 mg/dL (ref 8.4–10.5)
Chloride: 113 mEq/L — ABNORMAL HIGH (ref 96–112)
Creatinine, Ser: 2.08 mg/dL — ABNORMAL HIGH (ref 0.50–1.35)
GFR calc Af Amer: 40 mL/min — ABNORMAL LOW (ref >90)
GFR calc non Af Amer: 34 mL/min — ABNORMAL LOW (ref >90)
Glucose, Bld: 171 mg/dL — ABNORMAL HIGH (ref 70–99)
Potassium: 4.1 mEq/L (ref 3.7–5.3)
Sodium: 143 mEq/L (ref 137–147)
Total Bilirubin: 0.4 mg/dL (ref 0.3–1.2)
Total Protein: 5.3 g/dL — ABNORMAL LOW (ref 6.0–8.3)

## 2013-10-13 LAB — LACTATE DEHYDROGENASE: LDH: 237 U/L (ref 94–250)

## 2013-10-13 LAB — SEDIMENTATION RATE: Sed Rate: 35 mm/hr — ABNORMAL HIGH (ref 0–16)

## 2013-10-13 MED ORDER — SODIUM CHLORIDE 0.9 % IJ SOLN
10.0000 mL | Freq: Once | INTRAMUSCULAR | Status: AC
Start: 2013-10-13 — End: ?

## 2013-10-13 MED ORDER — HEPARIN SOD (PORK) LOCK FLUSH 100 UNIT/ML IV SOLN
500.0000 [IU] | Freq: Once | INTRAVENOUS | Status: AC
  Administered 2013-10-13: 500 [IU] via INTRAVENOUS
  Filled 2013-10-13: qty 5

## 2013-10-13 MED ORDER — ZOLEDRONIC ACID 4 MG/5ML IV CONC
2.0000 mg | Freq: Once | INTRAVENOUS | Status: AC
  Administered 2013-10-13: 2 mg via INTRAVENOUS
  Filled 2013-10-13: qty 2.5

## 2013-10-13 MED ORDER — SODIUM CHLORIDE 0.9 % IV SOLN
Freq: Once | INTRAVENOUS | Status: AC
  Administered 2013-10-13: 15:00:00 via INTRAVENOUS

## 2013-10-13 NOTE — Progress Notes (Signed)
Tolerated well

## 2013-10-14 LAB — C-REACTIVE PROTEIN: CRP: <0.5 mg/dL — ABNORMAL LOW (ref <0.60)

## 2013-10-15 LAB — KAPPA/LAMBDA LIGHT CHAINS
Kappa free light chain: 2.7 mg/dL — ABNORMAL HIGH (ref 0.33–1.94)
Kappa, lambda light chain ratio: 1.19 (ref 0.26–1.65)
Lambda free light chains: 2.27 mg/dL (ref 0.57–2.63)

## 2013-10-17 ENCOUNTER — Ambulatory Visit (HOSPITAL_COMMUNITY)
Admission: RE | Admit: 2013-10-17 | Discharge: 2013-10-17 | Disposition: A | Discharge status: Home / Self Care | Payer: Managed Care, Other (non HMO) | Source: Ambulatory Visit | Attending: Cardiovascular Disease | Admitting: Cardiovascular Disease

## 2013-10-17 DIAGNOSIS — I255 Ischemic cardiomyopathy: Secondary | ICD-10-CM

## 2013-10-17 DIAGNOSIS — E785 Hyperlipidemia, unspecified: Secondary | ICD-10-CM | POA: Insufficient documentation

## 2013-10-17 DIAGNOSIS — I2589 Other forms of chronic ischemic heart disease: Secondary | ICD-10-CM | POA: Insufficient documentation

## 2013-10-17 DIAGNOSIS — I4892 Unspecified atrial flutter: Secondary | ICD-10-CM | POA: Insufficient documentation

## 2013-10-17 DIAGNOSIS — I10 Essential (primary) hypertension: Secondary | ICD-10-CM | POA: Insufficient documentation

## 2013-10-17 DIAGNOSIS — E119 Type 2 diabetes mellitus without complications: Secondary | ICD-10-CM | POA: Insufficient documentation

## 2013-10-17 DIAGNOSIS — I369 Nonrheumatic tricuspid valve disorder, unspecified: Secondary | ICD-10-CM

## 2013-10-17 LAB — MULTIPLE MYELOMA PANEL, SERUM
Albumin ELP: 60.3 % (ref 55.8–66.1)
Alpha-1-Globulin: 6.6 % — ABNORMAL HIGH (ref 2.9–4.9)
Alpha-2-Globulin: 12.5 % — ABNORMAL HIGH (ref 7.1–11.8)
Beta 2: 5 % (ref 3.2–6.5)
Beta Globulin: 6.8 % (ref 4.7–7.2)
Gamma Globulin: 8.8 % — ABNORMAL LOW (ref 11.1–18.8)
IgA: 57 mg/dL — ABNORMAL LOW (ref 68–379)
IgG (Immunoglobin G), Serum: 421 mg/dL — ABNORMAL LOW (ref 650–1600)
IgM, Serum: 56 mg/dL — ABNORMAL LOW (ref 41–251)
M-Spike, %: NOT DETECTED g/dL
Total Protein: 4.9 g/dL — ABNORMAL LOW (ref 6.0–8.3)

## 2013-10-17 LAB — BETA 2 MICROGLOBULIN, SERUM: Beta-2 Microglobulin: 6.02 mg/L — ABNORMAL HIGH (ref <=2.51)

## 2013-10-17 MED ORDER — PERFLUTREN PROTEIN A MICROSPH IV SUSP
0.5000 mL | Freq: Once | INTRAVENOUS | Status: AC
  Administered 2013-10-17: 0.5 mL via INTRAVENOUS
  Filled 2013-10-17: qty 3

## 2013-10-17 NOTE — Progress Notes (Signed)
Echo Lab  2D Echocardiogram completed.  [place], RDCS 10/17/2013 12:41 PM

## 2013-10-22 ENCOUNTER — Emergency Department (HOSPITAL_COMMUNITY)
Admission: EM | Admit: 2013-10-22 | Discharge: 2013-10-23 | Disposition: A | Discharge status: Home / Self Care | Payer: Managed Care, Other (non HMO) | Attending: Emergency Medicine | Admitting: Emergency Medicine

## 2013-10-22 ENCOUNTER — Encounter (HOSPITAL_COMMUNITY): Payer: Self-pay | Admitting: Emergency Medicine

## 2013-10-22 ENCOUNTER — Emergency Department (HOSPITAL_COMMUNITY): Payer: Managed Care, Other (non HMO)

## 2013-10-22 DIAGNOSIS — Y9389 Activity, other specified: Secondary | ICD-10-CM | POA: Insufficient documentation

## 2013-10-22 DIAGNOSIS — IMO0002 Reserved for concepts with insufficient information to code with codable children: Secondary | ICD-10-CM | POA: Insufficient documentation

## 2013-10-22 DIAGNOSIS — Z87891 Personal history of nicotine dependence: Secondary | ICD-10-CM | POA: Insufficient documentation

## 2013-10-22 DIAGNOSIS — G4730 Sleep apnea, unspecified: Secondary | ICD-10-CM | POA: Insufficient documentation

## 2013-10-22 DIAGNOSIS — S8000XA Contusion of unspecified knee, initial encounter: Secondary | ICD-10-CM | POA: Insufficient documentation

## 2013-10-22 DIAGNOSIS — E669 Obesity, unspecified: Secondary | ICD-10-CM | POA: Insufficient documentation

## 2013-10-22 DIAGNOSIS — I252 Old myocardial infarction: Secondary | ICD-10-CM | POA: Insufficient documentation

## 2013-10-22 DIAGNOSIS — S8002XA Contusion of left knee, initial encounter: Secondary | ICD-10-CM

## 2013-10-22 DIAGNOSIS — Z872 Personal history of diseases of the skin and subcutaneous tissue: Secondary | ICD-10-CM | POA: Insufficient documentation

## 2013-10-22 DIAGNOSIS — N183 Chronic kidney disease, stage 3 unspecified: Secondary | ICD-10-CM | POA: Insufficient documentation

## 2013-10-22 DIAGNOSIS — Z79899 Other long term (current) drug therapy: Secondary | ICD-10-CM | POA: Insufficient documentation

## 2013-10-22 DIAGNOSIS — Z792 Long term (current) use of antibiotics: Secondary | ICD-10-CM | POA: Insufficient documentation

## 2013-10-22 DIAGNOSIS — E785 Hyperlipidemia, unspecified: Secondary | ICD-10-CM | POA: Insufficient documentation

## 2013-10-22 DIAGNOSIS — M25562 Pain in left knee: Secondary | ICD-10-CM

## 2013-10-22 DIAGNOSIS — Z87898 Personal history of other specified conditions: Secondary | ICD-10-CM | POA: Insufficient documentation

## 2013-10-22 DIAGNOSIS — Z7901 Long term (current) use of anticoagulants: Secondary | ICD-10-CM | POA: Insufficient documentation

## 2013-10-22 DIAGNOSIS — Y92410 Unspecified street and highway as the place of occurrence of the external cause: Secondary | ICD-10-CM | POA: Insufficient documentation

## 2013-10-22 DIAGNOSIS — M109 Gout, unspecified: Secondary | ICD-10-CM | POA: Insufficient documentation

## 2013-10-22 DIAGNOSIS — Z794 Long term (current) use of insulin: Secondary | ICD-10-CM | POA: Insufficient documentation

## 2013-10-22 DIAGNOSIS — E119 Type 2 diabetes mellitus without complications: Secondary | ICD-10-CM | POA: Insufficient documentation

## 2013-10-22 DIAGNOSIS — Z9889 Other specified postprocedural states: Secondary | ICD-10-CM | POA: Insufficient documentation
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
## 2013-10-23 MED ORDER — HYDROCODONE-ACETAMINOPHEN 5-325 MG PO TABS: 1.0000 | ORAL_TABLET | ORAL | Status: DC | PRN

## 2013-10-23 NOTE — Discharge Instructions (Signed)
Recommend Norco as needed for pain. Recommend an ACE wrap for stability and to limit swelling. Apply ice to your knee as instructed below. Keep your left leg elevated and rest your leg until symptoms resolve. Follow up with your primary care doctor. Return if symptoms worsen such as if you develop loss of sensation in your leg, if you leg becomes cold to the touch, if your leg below your injury becomes pale, or if you develop loss of your bowel or bladder function.  RICE: Routine Care for Injuries The routine care of many injuries includes Rest, Ice, Compression, and Elevation (RICE). HOME CARE INSTRUCTIONS  Rest is needed to allow your body to heal. Routine activities can usually be resumed when comfortable. Injured tendons and bones can take up to 6 weeks to heal. Tendons are the cord-like structures that attach muscle to bone.  Ice following an injury helps keep the swelling down and reduces pain.  Put ice in a plastic bag.  Place a towel between your skin and the bag.  Leave the ice on for 15-20 minutes, 03-04 times a day. Do this while awake, for the first 24 to 48 hours. After that, continue as directed by your caregiver.  Compression helps keep swelling down. It also gives support and helps with discomfort. If an elastic bandage has been applied, it should be removed and reapplied every 3 to 4 hours. It should not be applied tightly, but firmly enough to keep swelling down. Watch fingers or toes for swelling, bluish discoloration, coldness, numbness, or excessive pain. If any of these problems occur, remove the bandage and reapply loosely. Contact your caregiver if these problems continue.  Elevation helps reduce swelling and decreases pain. With extremities, such as the arms, hands, legs, and feet, the injured area should be placed near or above the level of the heart, if possible. SEEK IMMEDIATE MEDICAL CARE IF:  You have persistent pain and swelling.  You develop redness, numbness,  or unexpected weakness.  Your symptoms are getting worse rather than improving after several days. These symptoms may indicate that further evaluation or further X-rays are needed. Sometimes, X-rays may not show a small broken bone (fracture) until 1 week or 10 days later. Make a follow-up appointment with your caregiver. Ask when your X-ray results will be ready. Make sure you get your X-ray results. Document Released: 08/31/2000 Document Revised: 08/11/2011 Document Reviewed: 10/18/2010 [facility] Patient Information 2014 [place], [place].
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
## 2013-10-25 ENCOUNTER — Encounter (HOSPITAL_COMMUNITY): Payer: Self-pay | Admitting: Dietician

## 2013-10-25 NOTE — Progress Notes (Signed)
Follow-Up Outpatient Nutrition Note Date: 10/25/2013  Appt Start Time: 1615  Pt arrived 45 minutes late to appointment  Nutrition Assessment:  Current weight: Weight: 333 lb (151.048 kg)  BMI: Body mass index is 41.62 kg/(m^2).  Weight changes: -43# (11%) wt loss since 09/25/11.  [name] was last seen on 09/25/11. He has additional questions about his diet and desired a follow-up appointment.  He has multiple questions about his diet. He has eliminated many foods including beef and pork. He is eating mainly lean protein, fruits, and vegetables now. He is receiving maintenance chemotherapy for multiple myeloma.  He has multiple questions in regards to the foods that he eats. He is drinking mainly water. He eats 3 meals per day, usually prepared at home, although he admits to indulging at restaurants occasionally. He also wants to know if [place] based products are better options. He reports his wife is a big support.  He is concerned about gaining weight and high blood sugars due to steroids. CBGs typically run from the 80-100's. He is in sliding scale insulin if readings are above 150, which he reports is not often, expect on Fridays and Saturdays. He admits to one hypoglycemic episode 3 weeks ago.  Labs: CMP     Component Value Date/Time   NA 143 10/13/2013 1327   K 4.1 10/13/2013 1327   CL 113* 10/13/2013 1327   CO2 17* 10/13/2013 1327   GLUCOSE 171* 10/13/2013 1327   BUN 21 10/13/2013 1327   CREATININE 2.08* 10/13/2013 1327   CREATININE 2.24* 10/05/2012 1411   CALCIUM 8.5 10/13/2013 1327   CALCIUM 6.8* 08/15/2013 1217   PROT 4.9* 10/13/2013 1327   PROT 5.3* 10/13/2013 1327   ALBUMIN 2.9* 10/13/2013 1327   AST 35 10/13/2013 1327   ALT 41 10/13/2013 1327   ALKPHOS 97 10/13/2013 1327   BILITOT 0.4 10/13/2013 1327   GFRNONAA 34* 10/13/2013 1327   GFRAA 40* 10/13/2013 1327    Lipid Panel     Component Value Date/Time   CHOL 158 07/18/2010 1724   TRIG 173 07/18/2010 1724   HDL 33 07/18/2010 1724   LDLCALC 90 07/18/2010 1724     Lab Results  Component Value Date   HGBA1C 9.2* 09/24/2011   HGBA1C 9.8 07/18/2010   HGBA1C 7.4 04/30/2009   Lab Results  Component Value Date   LDLCALC 90 07/18/2010   CREATININE 2.08* 10/13/2013     Diet recall: Breakfast: egg beater, [place] sausage, [place] bacon; Lunch: bologna sandwich OR [employer] Meal OR PBJ sandwich; Dinner: meat, starch, and vegetable. Beverages are mainly water and diet clear colored sodas.    Nutrition Diagnosis: Nutrition-related knowledge deficit; ongoing  Nutrition Intervention: Nutrition rx: NAS, diabetic, low purine diet; low calorie beverages only; 3 meals per day; avoid high purine foods; 1 serving of medium purine foods when not experiencing flare ups; avoid medium purine foods during flare-ups  Education/ counseling provided: Educated pt on principles of weight management. Discussed principles of energy expenditure and how changes in diet and physical activity affect weight status. Discussed nutritional content of commonly eaten foods and suggested healthier alternatives. Educated pt on plate method and a general, healthful diet that includes low fat dairy, lean meats, whole fruits and vegetables, and whole grains most often. Discussed importance of a healthy diet along with regular physical activity (at least 30 minutes 5 times per week) to achieve weight loss goals. Encouraged slow, moderate weight loss (0.5-2# weight loss per week) and adopting healthy lifestyle changes  vs. obtaining a certain body type or weight. Encouraged weighing self weekly at a consistent day and time of choice. Used TeachBack to assess understanding.   Understanding/Motivation/ Ability to follow recommendations: Expect fair compliance   Monitoring and Evaluation: Previous Goals: 1) 1-2# wt loss per week- progressing; 2) Avoid high purine foods- goal met, 3) 1 serving of medium purine foods when not experiencing flare-ups and avoid medium purine  foods during flare-ups; goal met  Goals for next visit:  1) 1-2# wt loss per week; 2) Physical activity as tolerated  Recommendations: 1) Continue to check labels for nutrition content; 2) Continue with weight loss; 3) Plan meal ahead to ensure good meal planning strategies; 4) Consider keeping a food diary   F/U: PRN. Provided RD contact information.    Dottie Vaquerano A. Jimmye [name], RD, LDN Date:10/25/2013 Appt EndTime: 7341

## 2013-11-01 ENCOUNTER — Other Ambulatory Visit (HOSPITAL_COMMUNITY): Payer: Self-pay | Admitting: Family Medicine

## 2013-11-01 ENCOUNTER — Ambulatory Visit (HOSPITAL_COMMUNITY)
Admission: RE | Admit: 2013-11-01 | Discharge: 2013-11-01 | Disposition: A | Discharge status: Home / Self Care | Payer: Managed Care, Other (non HMO) | Source: Ambulatory Visit | Attending: Family Medicine | Admitting: Family Medicine

## 2013-11-01 DIAGNOSIS — M7989 Other specified soft tissue disorders: Secondary | ICD-10-CM | POA: Insufficient documentation

## 2013-11-01 DIAGNOSIS — L819 Disorder of pigmentation, unspecified: Secondary | ICD-10-CM

## 2013-11-03 ENCOUNTER — Other Ambulatory Visit (HOSPITAL_COMMUNITY): Payer: Self-pay | Admitting: Oncology

## 2013-11-03 DIAGNOSIS — C9000 Multiple myeloma not having achieved remission: Secondary | ICD-10-CM

## 2013-11-03 MED ORDER — LENALIDOMIDE 5 MG PO CAPS: ORAL_CAPSULE | ORAL | Status: DC

## 2013-11-07 ENCOUNTER — Other Ambulatory Visit (HOSPITAL_COMMUNITY): Payer: Self-pay | Admitting: Oncology

## 2013-11-07 DIAGNOSIS — C9000 Multiple myeloma not having achieved remission: Secondary | ICD-10-CM

## 2013-11-07 MED ORDER — LENALIDOMIDE 5 MG PO CAPS: ORAL_CAPSULE | ORAL | Status: DC

## 2013-11-09 ENCOUNTER — Other Ambulatory Visit (HOSPITAL_COMMUNITY): Payer: Self-pay | Admitting: Oncology

## 2013-11-10 ENCOUNTER — Encounter (HOSPITAL_BASED_OUTPATIENT_CLINIC_OR_DEPARTMENT_OTHER): Payer: Managed Care, Other (non HMO) | Admitting: Oncology

## 2013-11-10 ENCOUNTER — Encounter (HOSPITAL_COMMUNITY): Payer: Managed Care, Other (non HMO) | Attending: Oncology

## 2013-11-10 VITALS — BP 117/73 | HR 52 | Temp 97.2°F | Resp 20

## 2013-11-10 DIAGNOSIS — X58XXXA Exposure to other specified factors, initial encounter: Secondary | ICD-10-CM | POA: Insufficient documentation

## 2013-11-10 DIAGNOSIS — Z9889 Other specified postprocedural states: Secondary | ICD-10-CM | POA: Insufficient documentation

## 2013-11-10 DIAGNOSIS — C9000 Multiple myeloma not having achieved remission: Secondary | ICD-10-CM

## 2013-11-10 DIAGNOSIS — Z452 Encounter for adjustment and management of vascular access device: Secondary | ICD-10-CM

## 2013-11-10 DIAGNOSIS — Z95828 Presence of other vascular implants and grafts: Secondary | ICD-10-CM

## 2013-11-10 LAB — COMPREHENSIVE METABOLIC PANEL
ALT: 113 U/L — ABNORMAL HIGH (ref 0–53)
AST: 114 U/L — ABNORMAL HIGH (ref 0–37)
Albumin: 2.9 g/dL — ABNORMAL LOW (ref 3.5–5.2)
Alkaline Phosphatase: 102 U/L (ref 39–117)
BUN: 32 mg/dL — ABNORMAL HIGH (ref 6–23)
CO2: 16 mEq/L — ABNORMAL LOW (ref 19–32)
Calcium: 7.6 mg/dL — ABNORMAL LOW (ref 8.4–10.5)
Chloride: 113 mEq/L — ABNORMAL HIGH (ref 96–112)
Creatinine, Ser: 1.83 mg/dL — ABNORMAL HIGH (ref 0.50–1.35)
GFR calc Af Amer: 47 mL/min — ABNORMAL LOW (ref >90)
GFR calc non Af Amer: 40 mL/min — ABNORMAL LOW (ref >90)
Glucose, Bld: 151 mg/dL — ABNORMAL HIGH (ref 70–99)
Potassium: 4 mEq/L (ref 3.7–5.3)
Sodium: 143 mEq/L (ref 137–147)
Total Bilirubin: 0.5 mg/dL (ref 0.3–1.2)
Total Protein: 5.5 g/dL — ABNORMAL LOW (ref 6.0–8.3)

## 2013-11-10 LAB — CBC WITH DIFFERENTIAL/PLATELET
Basophils Absolute: 0 10*3/uL (ref 0.0–0.1)
Basophils Relative: 0 % (ref 0–1)
Eosinophils Absolute: 0.3 10*3/uL (ref 0.0–0.7)
Eosinophils Relative: 4 % (ref 0–5)
HCT: 29.5 % — ABNORMAL LOW (ref 39.0–52.0)
Hemoglobin: 9.8 g/dL — ABNORMAL LOW (ref 13.0–17.0)
Lymphocytes Relative: 31 % (ref 12–46)
Lymphs Abs: 2.1 10*3/uL (ref 0.7–4.0)
MCH: 31.1 pg (ref 26.0–34.0)
MCHC: 33.2 g/dL (ref 30.0–36.0)
MCV: 93.7 fL (ref 78.0–100.0)
Monocytes Absolute: 0.6 10*3/uL (ref 0.1–1.0)
Monocytes Relative: 8 % (ref 3–12)
Neutro Abs: 3.8 10*3/uL (ref 1.7–7.7)
Neutrophils Relative %: 57 % (ref 43–77)
Platelets: 169 10*3/uL (ref 150–400)
RBC: 3.15 MIL/uL — ABNORMAL LOW (ref 4.22–5.81)
RDW: 15.4 % (ref 11.5–15.5)
WBC: 6.8 10*3/uL (ref 4.0–10.5)

## 2013-11-10 LAB — LACTATE DEHYDROGENASE: LDH: 281 U/L — ABNORMAL HIGH (ref 94–250)

## 2013-11-10 LAB — C-REACTIVE PROTEIN: CRP: <0.5 mg/dL — ABNORMAL LOW (ref <0.60)

## 2013-11-10 LAB — SEDIMENTATION RATE: Sed Rate: 25 mm/hr — ABNORMAL HIGH (ref 0–16)

## 2013-11-10 MED ORDER — SODIUM CHLORIDE 0.9 % IJ SOLN
10.0000 mL | INTRAMUSCULAR | Status: DC | PRN
  Administered 2013-11-10: 10 mL via INTRAVENOUS

## 2013-11-10 MED ORDER — HEPARIN SOD (PORK) LOCK FLUSH 100 UNIT/ML IV SOLN
INTRAVENOUS | Status: AC
  Filled 2013-11-10: qty 5

## 2013-11-10 MED ORDER — HEPARIN SOD (PORK) LOCK FLUSH 100 UNIT/ML IV SOLN
500.0000 [IU] | Freq: Once | INTRAVENOUS | Status: AC
  Administered 2013-11-10: 500 [IU] via INTRAVENOUS

## 2013-11-10 NOTE — Progress Notes (Signed)
Khamryn is seen as a work-in today.  I personally reviewed and went over laboratory results with the patient.  The results are noted within this dictation.  His Calcium is low, and therefore, we will hold Zometa.  I provided him education regarding Zometa's mechanism of action and why it is important to not give the medication in the setting of hypocalcemia.  I recommended he take his calcium supplementation as ordered.  He reports that when Dr. [name] was here, he quoted the patient a life expectancy of 2-5 years.  I cannot support this statement as Zhayden has been on treatment for 2- 2.5 years.  With new medications coming out, it is difficult to put a life expectancy on [place].  As a result, I recommended that if there are any loose ends with regards to his will, funeral arrangements, etc, he should do them while he is of sound mind.    He notes some cramping of his hands occasionally, and it is noted that his K+ is WNL.  I have recommended he follow-up with his PCP regarding this complaint.  Otherwise, he denies any complaints and ROS questioning is negative.   Following our visit, I note his ALT and AST to be elevated.  I will repeat a CMET in 2 weeks when he returns for an office visit.  Patient and plan discussed with Dr. Farrel Gobble and he is in agreement with the aforementioned.   Jamisha Hoeschen 11/10/2013

## 2013-11-10 NOTE — Progress Notes (Signed)
Accessed port per protocol for possible zometa infusion. Lab work done. Zometa not given due to low calcium level. Instruct pt per T.Kefalas,PA he is to keep taking Calcium 2 capsules 2 times daily. RTC in 4 weeks for possible zometa infusion and lab work. Patient voiced understanding. Flushed port per protocol and de-accessed.

## 2013-11-11 LAB — KAPPA/LAMBDA LIGHT CHAINS
Kappa free light chain: 1.65 mg/dL (ref 0.33–1.94)
Kappa, lambda light chain ratio: 0.87 (ref 0.26–1.65)
Lambda free light chains: 1.89 mg/dL (ref 0.57–2.63)

## 2013-11-12 ENCOUNTER — Other Ambulatory Visit (INDEPENDENT_AMBULATORY_CARE_PROVIDER_SITE_OTHER): Payer: Self-pay | Admitting: Internal Medicine

## 2013-11-14 LAB — MULTIPLE MYELOMA PANEL, SERUM
Albumin ELP: 61.7 % (ref 55.8–66.1)
Alpha-1-Globulin: 6.5 % — ABNORMAL HIGH (ref 2.9–4.9)
Alpha-2-Globulin: 12 % — ABNORMAL HIGH (ref 7.1–11.8)
Beta 2: 4 % (ref 3.2–6.5)
Beta Globulin: 7.3 % — ABNORMAL HIGH (ref 4.7–7.2)
Gamma Globulin: 8.5 % — ABNORMAL LOW (ref 11.1–18.8)
IgA: 67 mg/dL — ABNORMAL LOW (ref 68–379)
IgG (Immunoglobin G), Serum: 447 mg/dL — ABNORMAL LOW (ref 650–1600)
IgM, Serum: 50 mg/dL — ABNORMAL LOW (ref 41–251)
M-Spike, %: NOT DETECTED g/dL
Total Protein: 4.9 g/dL — ABNORMAL LOW (ref 6.0–8.3)

## 2013-11-14 LAB — BETA 2 MICROGLOBULIN, SERUM: Beta-2 Microglobulin: 5.79 mg/L — ABNORMAL HIGH (ref <=2.51)

## 2013-11-14 NOTE — Telephone Encounter (Signed)
Per Marlane Hatcher, NP may refill but no refills.

## 2013-11-22 NOTE — Progress Notes (Signed)
-  No show-  KEFALAS,[name]  

## 2013-11-23 ENCOUNTER — Other Ambulatory Visit: Payer: Self-pay | Admitting: Cardiovascular Disease

## 2013-11-25 ENCOUNTER — Ambulatory Visit (HOSPITAL_COMMUNITY): Payer: Managed Care, Other (non HMO) | Admitting: Oncology

## 2013-12-01 ENCOUNTER — Other Ambulatory Visit (HOSPITAL_COMMUNITY): Payer: Self-pay | Admitting: Oncology

## 2013-12-01 DIAGNOSIS — C9000 Multiple myeloma not having achieved remission: Secondary | ICD-10-CM

## 2013-12-01 MED ORDER — LENALIDOMIDE 5 MG PO CAPS: ORAL_CAPSULE | ORAL | Status: DC

## 2013-12-08 ENCOUNTER — Encounter (HOSPITAL_COMMUNITY): Payer: Managed Care, Other (non HMO) | Attending: Oncology

## 2013-12-08 DIAGNOSIS — C9000 Multiple myeloma not having achieved remission: Secondary | ICD-10-CM

## 2013-12-08 DIAGNOSIS — X58XXXA Exposure to other specified factors, initial encounter: Secondary | ICD-10-CM | POA: Insufficient documentation

## 2013-12-08 LAB — COMPREHENSIVE METABOLIC PANEL
ALT: 75 U/L — ABNORMAL HIGH (ref 0–53)
AST: 71 U/L — ABNORMAL HIGH (ref 0–37)
Albumin: 2.9 g/dL — ABNORMAL LOW (ref 3.5–5.2)
Alkaline Phosphatase: 87 U/L (ref 39–117)
Anion gap: 13 (ref 5–15)
BUN: 26 mg/dL — ABNORMAL HIGH (ref 6–23)
CO2: 17 mEq/L — ABNORMAL LOW (ref 19–32)
Calcium: 8.1 mg/dL — ABNORMAL LOW (ref 8.4–10.5)
Chloride: 110 mEq/L (ref 96–112)
Creatinine, Ser: 1.8 mg/dL — ABNORMAL HIGH (ref 0.50–1.35)
GFR calc Af Amer: 48 mL/min — ABNORMAL LOW (ref >90)
GFR calc non Af Amer: 41 mL/min — ABNORMAL LOW (ref >90)
Glucose, Bld: 227 mg/dL — ABNORMAL HIGH (ref 70–99)
Potassium: 4.1 mEq/L (ref 3.7–5.3)
Sodium: 140 mEq/L (ref 137–147)
Total Bilirubin: 0.4 mg/dL (ref 0.3–1.2)
Total Protein: 5.3 g/dL — ABNORMAL LOW (ref 6.0–8.3)

## 2013-12-08 MED ORDER — HEPARIN SOD (PORK) LOCK FLUSH 100 UNIT/ML IV SOLN
INTRAVENOUS | Status: AC
  Filled 2013-12-08: qty 5

## 2013-12-08 NOTE — Progress Notes (Signed)
[name], PA notified of pt's Ca++ of 8.1.  He instructed to hold Zometa today and return in 4 weeks for repeat labs and possible Zometa infusion.  Pt's port had been accessed to obtain CMET. Portacath flushed with NS 12ml and Heparin 500units. Instructed pt to continue taking calcium supplements as previously instructed.  Verbalizes understanding.

## 2013-12-12 ENCOUNTER — Ambulatory Visit (INDEPENDENT_AMBULATORY_CARE_PROVIDER_SITE_OTHER): Payer: Managed Care, Other (non HMO) | Admitting: Cardiovascular Disease

## 2013-12-12 ENCOUNTER — Encounter: Payer: Self-pay | Admitting: Cardiovascular Disease

## 2013-12-12 VITALS — BP 122/80 | HR 55 | Ht 75.0 in | Wt 316.0 lb

## 2013-12-12 DIAGNOSIS — I2589 Other forms of chronic ischemic heart disease: Secondary | ICD-10-CM

## 2013-12-12 DIAGNOSIS — Z7189 Other specified counseling: Secondary | ICD-10-CM

## 2013-12-12 DIAGNOSIS — I7090 Unspecified atherosclerosis: Secondary | ICD-10-CM

## 2013-12-12 DIAGNOSIS — I5189 Other ill-defined heart diseases: Secondary | ICD-10-CM

## 2013-12-12 DIAGNOSIS — C9000 Multiple myeloma not having achieved remission: Secondary | ICD-10-CM

## 2013-12-12 DIAGNOSIS — E1122 Type 2 diabetes mellitus with diabetic chronic kidney disease: Secondary | ICD-10-CM

## 2013-12-12 DIAGNOSIS — I2510 Atherosclerotic heart disease of native coronary artery without angina pectoris: Secondary | ICD-10-CM

## 2013-12-12 DIAGNOSIS — N183 Chronic kidney disease, stage 3 unspecified: Secondary | ICD-10-CM

## 2013-12-12 DIAGNOSIS — R001 Bradycardia, unspecified: Secondary | ICD-10-CM

## 2013-12-12 DIAGNOSIS — I5022 Chronic systolic (congestive) heart failure: Secondary | ICD-10-CM

## 2013-12-12 DIAGNOSIS — E119 Type 2 diabetes mellitus without complications: Secondary | ICD-10-CM

## 2013-12-12 DIAGNOSIS — I10 Essential (primary) hypertension: Secondary | ICD-10-CM

## 2013-12-12 DIAGNOSIS — I4892 Unspecified atrial flutter: Secondary | ICD-10-CM

## 2013-12-12 DIAGNOSIS — I498 Other specified cardiac arrhythmias: Secondary | ICD-10-CM

## 2013-12-12 DIAGNOSIS — E782 Mixed hyperlipidemia: Secondary | ICD-10-CM

## 2013-12-12 DIAGNOSIS — I255 Ischemic cardiomyopathy: Secondary | ICD-10-CM

## 2013-12-12 DIAGNOSIS — E1129 Type 2 diabetes mellitus with other diabetic kidney complication: Secondary | ICD-10-CM

## 2013-12-12 DIAGNOSIS — I519 Heart disease, unspecified: Secondary | ICD-10-CM

## 2013-12-12 NOTE — Patient Instructions (Signed)
Your physician recommends that you schedule a follow-up appointment in:4 months with Dr.Koneswaran   We will schedule you with one of our electrophysiology cardiologists today     Your physician recommends that you continue on your current medications as directed. Please refer to the Current Medication list given to you today.      Thank you for choosing [facility] !

## 2013-12-12 NOTE — Progress Notes (Signed)
Patient ID: [name], male   DOB: 1960-10-09, 53 y.o.   MRN: [id number]      SUBJECTIVE: The patient is a 53 year old male with a past medical history significant for coronary artery disease with prior myocardial infarction and stent placement, ischemic cardiomyopathy and chronic systolic heart failure with an echocardiogram in April 2014 demonstrating severe left ventricular systolic dysfunction, EF 93%. A repeat echocardiogram performed with contrast enhancement in May 2015 demonstrated no improvement in systolic function, with an EF of 30%. He also has atrial flutter and required cardioversion in May 2014. He also has multiple myeloma, insulin-dependent diabetes mellitus, chronic kidney disease, sleep apnea, hypertension, and hyperlipidemia.  Overall he has been feeling quite well and more energetic after switching Coreg to Toprol-XL. He takes torsemide as needed for leg swelling, and this only occurs if he has been on his feet for prolonged periods of time. He sleeps with 3 pillows and his legs elevated and says he does this for comfort. He uses CPAP at night.  He has been trying to lose weight with an overall goal of getting down to 250 lbs. He is here with his wife to discuss the possibility of ICD implantation.   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    No Known Allergies  Current Outpatient Prescriptions  Medication Sig Dispense Refill  . acyclovir (ZOVIRAX) 400 MG tablet Take 1 tablet (400 mg total) by mouth daily.  30 tablet  3  . allopurinol (ZYLOPRIM) 300 MG tablet Take 1 tablet (300 mg total) by mouth daily.  30 tablet  3  . atorvastatin (LIPITOR) 80 MG tablet TAKE ONE TABLET BY MOUTH AT BEDTIME.  30 tablet  6  . calcium-vitamin D (OSCAL WITH D) 500-200 MG-UNIT per tablet Take 1 tablet by mouth 2 (two) times daily.       Marland Kitchen CREON 36000 UNITS CPEP TAKE 3 CAPSULES BY MOUTH DAILY WITH MEALS AND 1 CAPSULES WITH SNACKS.  165  capsule  11  . dexamethasone (DECADRON) 4 MG tablet Take 10 tablets (40 mg total) by mouth once a week. Takes 5 tabs bid every Friday.  40 tablet  5  . dexlansoprazole (DEXILANT) 60 MG capsule Take 60 mg by mouth every morning.       . diphenoxylate-atropine (LOMOTIL) 2.5-0.025 MG per tablet TAKE 2 TABS BY MOUTH EVERY MORNING. MAX 8 A DAY.  120 tablet  0  . ergocalciferol (VITAMIN D2) 50000 UNITS capsule 50,000 units weekly x 4 weeks and then 50,000 units monthly  20 capsule  1  . HYDROcodone-acetaminophen (NORCO/VICODIN) 5-325 MG per tablet Take 1-2 tablets by mouth every 4 (four) hours as needed.  17 tablet  0  . insulin detemir (LEVEMIR) 100 UNIT/ML injection Inject 40 Units into the skin every morning.       . insulin lispro (HUMALOG) 100 UNIT/ML injection Inject into the skin 2 (two) times daily with a meal. Sliding Scale per patient      . lenalidomide ([name]) 5 MG capsule Take 1 tablet PO 7 days on and 7 days off.  14 capsule  0  . lidocaine-prilocaine (EMLA) cream Apply 1 application topically as needed (for port-a-cath access).       Marland Kitchen loratadine (CLARITIN) 10 MG tablet Take 10 mg by mouth every morning.       . metoprolol succinate (TOPROL XL) 25 MG 24 hr tablet Take 1 tablet (25 mg total) by mouth daily.  30 tablet  6  . Multiple Vitamins-Minerals (MULTIVITAMINS THER. W/MINERALS) TABS Take 1 tablet by mouth every morning.       . nitroGLYCERIN (NITROSTAT) 0.4 MG SL tablet Place 1 tablet (0.4 mg total) under the tongue every 5 (five) minutes as needed for chest pain.  25 tablet  3  . ondansetron (ZOFRAN) 8 MG tablet Take 1 tablet (8 mg total) by mouth every 4 (four) hours as needed for nausea.  15 tablet  0  . oxyCODONE-acetaminophen (PERCOCET) 5-325 MG per tablet Take 2 tablets by mouth every 4 (four) hours as needed for pain.  30 tablet  0  . potassium chloride SA (K-DUR,KLOR-CON) 20 MEQ tablet Take 2 tablets (40 mEq total) by mouth 2 (two) times daily.  120 tablet  2  .  sulfamethoxazole-trimethoprim (BACTRIM DS) 800-160 MG per tablet Take 1 tablet by mouth every Monday, Wednesday, and Friday.  12 tablet  5  . torsemide (DEMADEX) 20 MG tablet Take 20 mg by mouth daily as needed (for fluid retention).       Alveda Reasons 20 MG TABS tablet TAKE 1 TABLET BY MOUTH DAILY.  90 tablet  3  . Zoledronic Acid (ZOMETA IV) Inject 2 mg into the vein every 28 (twenty-eight) days.        No current facility-administered medications for this visit.   Facility-Administered Medications Ordered in Other Visits  Medication Dose Route Frequency Provider Last Rate Last Dose  . sodium chloride 0.9 % injection 10 mL  10 mL Intravenous Once Farrel Gobble, MD        Past Medical History  Diagnosis Date  . Arteriosclerotic cardiovascular disease (ASCVD)     MI-2000s; stent to the proximal LAD and diagonal in 2001; stress nuclear in 2008-impaired exercise capacity, left ventricular dilatation, moderately to severely depressed EF, apical, inferior and anteroseptal scar  . Hypertension   . 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 Diabetes mellitus     Insulin  . GERD (gastroesophageal reflux disease)   . Pedal edema     Venous insufficiency  . Obesity   . Hyperlipidemia   . Injection site reaction   . Cellulitis of leg     both legs  . Chronic kidney disease, stage 3, mod decreased GFR     Creatinine of 1.84 in 06/2011 and 1.5 in 07/2011  . Chronic diarrhea   . Sleep apnea     uses cpap  . Ulcer   . Atrial flutter   . Multiple myeloma 07/01/2011  . Myocardial infarction 2000  . Gout     Past Surgical History  Procedure Laterality Date  . Laparoscopic gastric banding  2006    has been removed  . Wrist surgery      Left; removal of bone fragment  . Incision and drainage abscess anal    . Abscess drainage      Scrotal  . Bone marrow biopsy  05/13/11  . Portacath placement  07/07/2011    Procedure: INSERTION PORT-A-CATH;  Surgeon: Scherry Ran, MD;  Location: [facility];   Service: General;  Laterality: N/A;  . Colonoscopy  11/28/2011    Procedure: COLONOSCOPY;  Surgeon: Rogene Houston, MD;  Location: [facility];  Service: Endoscopy;  Laterality: N/A;  930  . Esophagogastroduodenoscopy  01/02/2012    Procedure: ESOPHAGOGASTRODUODENOSCOPY (EGD);  Surgeon: Rogene Houston, MD;  Location: [facility];  Service: Endoscopy;  Laterality: N/A;  100  . Esophageal biopsy  01/02/2012    Procedure:  BIOPSY;  Surgeon: Rogene Houston, MD;  [facility]: [facility];  Service: Endoscopy;  Laterality: N/A;  . Esophagogastroduodenoscopy N/A 09/20/2012    Procedure: ESOPHAGOGASTRODUODENOSCOPY (EGD);  Surgeon: Rogene Houston, MD;  [facility]: [facility];  Service: Endoscopy;  Laterality: N/A;  . Eus N/A 10/07/2012    Procedure: UPPER ENDOSCOPIC ULTRASOUND (EUS) LINEAR;  Surgeon: Milus Banister, MD;  Location: [facility];  Service: Endoscopy;  Laterality: N/A;  . Cardioversion N/A 10/13/2012    Procedure: CARDIOVERSION;  Surgeon: Yehuda Savannah, MD;  Location: [facility];  Service: Cardiovascular;  Laterality: N/A;  . Cardiac catheterization      cardiac stent  . Port-a-cath removal Left 12/07/2012    Procedure: REMOVAL PORT-A-CATH;  Surgeon: Scherry Ran, MD;  Location: [facility];  Service: General;  Laterality: Left;  . Portacath placement N/A 12/07/2012    Procedure: INSERTION PORT-A-CATH;  Surgeon: Scherry Ran, MD;  Location: [facility];  Service: General;  Laterality: N/A;  Attempted portacath placement on left and right side    History   Social History  . Marital Status: Married    Spouse Name: N/A    Number of Children: N/A  . Years of Education: 16   Occupational History  . Nurse, learning disability   . Funeral Director    Social History Main Topics  . Smoking status: Former Smoker -- 1 years    Types: Cigarettes, Cigars    Quit date: 05/17/2001  . Smokeless tobacco: Never Used  . Alcohol Use: No  . Drug Use: No  . Sexual Activity: Yes    Birth Control/  Protection: None   Other Topics Concern  . Not on file   Social History Narrative  . No narrative on file     Filed Vitals:   12/12/13 0903  Height: _0  (1.905 m)  Weight: 316 lb (143.337 kg)   BP 122/80  Pulse 55    PHYSICAL EXAM General: NAD Neck: No JVD, no thyromegaly. Lungs: Clear to auscultation bilaterally with normal respiratory effort. CV: Nondisplaced PMI.  Regular rate and rhythm, normal S1/S2, no S3/S4, no murmur. No pretibial or periankle edema.  No carotid bruit.  Normal pedal pulses.  Abdomen: Soft, nontender, no hepatosplenomegaly, no distention.  Neurologic: Alert and oriented x 3.  Psych: Normal affect. Extremities: No clubbing or cyanosis.   ECG: reviewed and available in electronic records.      ASSESSMENT AND PLAN: 1. CAD with prior MI and stents: Symptomatically stable. Due to fatigue and bradycardia, I previously switched carvedilol to Toprol-XL 25 mg daily and he has improved from this standpoint. HR 55 bpm today. Continue high dose statin therapy with Lipitor 80 mg daily. No ASA as he taking Xarelto in context of anemia with multiple myeloma (Hgb 9.8 on 6/11). 2. Ischemic cardiomyopathy/chronic systolic heart failure: Currently compensated. EF 30% by echo in 09/2013. He was recently started on a new medication by his nephrologist, which may be an ACEI and we are trying to confirm this. I had a very lengthy discussion with both the patient and his wife regarding prophylactic implantation of ICD for the prevention of potentially fatal ventricular arrhythmias. He is interested in meeting with an electrophysiologist to further pursue this. 3. HTN: Well controlled. 4. Atrial flutter: Currently in a regular rhythm. No bleeding problems with Xarelto.  5. Hyperlipidemia: I will check a lipid panel at his next visit if not already completed by PCP. AST 114, ALT 113 on 6/11. Continue to monitor  with high-dose statin therapy.  6. CKD: Currently stable. BUN 32,  creatinine 1.83, GFR 47 ml/min on 11/10/13. 7. Sleep apnea: Uses CPAP daily.  8. Multiple myeloma: Followed by Hem-Onc. Hgb 9.8 on 6/11.  Dispo: f/u 4 months. Will schedule appt with EP for ICD discussion.  Kate Sable, M.D., F.A.C.C.

## 2013-12-14 ENCOUNTER — Other Ambulatory Visit (HOSPITAL_COMMUNITY): Payer: Self-pay | Admitting: Oncology

## 2013-12-19 ENCOUNTER — Encounter: Payer: Self-pay | Admitting: *Deleted

## 2013-12-19 ENCOUNTER — Ambulatory Visit (INDEPENDENT_AMBULATORY_CARE_PROVIDER_SITE_OTHER): Payer: Managed Care, Other (non HMO) | Admitting: Internal Medicine

## 2013-12-19 ENCOUNTER — Encounter: Payer: Self-pay | Admitting: Internal Medicine

## 2013-12-19 VITALS — BP 90/50 | HR 48 | Ht 75.0 in | Wt 321.0 lb

## 2013-12-19 DIAGNOSIS — I5022 Chronic systolic (congestive) heart failure: Secondary | ICD-10-CM

## 2013-12-19 NOTE — Assessment & Plan Note (Signed)
He is class 2 despite maximal medical therapy. I am not sure he is a candidate for prophylactic ICD implant. I will review with his oncologist.

## 2013-12-19 NOTE — Patient Instructions (Signed)
Your physician recommends that you schedule a follow-up appointment in: 4-5 weeks with Dr Lovena Le in the [place] office.

## 2013-12-19 NOTE — Progress Notes (Signed)
HPI [name] is referred today for consideration for a prophylactic ICD implant. He is a pleasant middle aged man with known CAD, s/p MI, with longstanding LV dysfunction, EF 30%. He has never had syncope. He has class 2 CHF. He has a h/o morbid obesity and weighed as much as 470 lbs. He has lost over 150 lbs. He has undergone chemotherapy for multiple myeloma. He is said to be in remission. He has chronic renal insufficiency. No Known Allergies   Current Outpatient Prescriptions  Medication Sig Dispense Refill  . acyclovir (ZOVIRAX) 400 MG tablet Take 1 tablet (400 mg total) by mouth daily.  30 tablet  3  . allopurinol (ZYLOPRIM) 300 MG tablet Take 1 tablet (300 mg total) by mouth daily.  30 tablet  3  . atorvastatin (LIPITOR) 80 MG tablet TAKE ONE TABLET BY MOUTH AT BEDTIME.  30 tablet  6  . calcitRIOL (ROCALTROL) 0.25 MCG capsule Take 0.25 mcg by mouth daily.      . calcium-vitamin D (OSCAL WITH D) 500-200 MG-UNIT per tablet Take 1 tablet by mouth 2 (two) times daily.       Marland Kitchen dexamethasone (DECADRON) 4 MG tablet TAKE 5 TABLETS BY MOUTH TWICE DAILY EVERY FRIDAY.  40 tablet  3  . dexlansoprazole (DEXILANT) 60 MG capsule Take 60 mg by mouth every morning.       . diphenoxylate-atropine (LOMOTIL) 2.5-0.025 MG per tablet TAKE 2 TABS BY MOUTH EVERY MORNING. MAX 8 A DAY.  120 tablet  0  . ergocalciferol (VITAMIN D2) 50000 UNITS capsule 50,000 units weekly x 4 weeks and then 50,000 units monthly  20 capsule  1  . HYDROcodone-acetaminophen (NORCO/VICODIN) 5-325 MG per tablet Take 1-2 tablets by mouth every 4 (four) hours as needed.  17 tablet  0  . insulin detemir (LEVEMIR) 100 UNIT/ML injection Inject 40 Units into the skin every morning.       . insulin lispro (HUMALOG) 100 UNIT/ML injection Inject into the skin 2 (two) times daily with a meal. Sliding Scale per patient      . lenalidomide ([name]) 5 MG capsule Take 1 tablet PO 7 days on and 7 days off.  14 capsule  0  .  lidocaine-prilocaine (EMLA) cream Apply 1 application topically as needed (for port-a-cath access).       Marland Kitchen lisinopril (PRINIVIL,ZESTRIL) 5 MG tablet Take 5 mg by mouth every other day.      . loratadine (CLARITIN) 10 MG tablet Take 10 mg by mouth every morning.       . metoprolol succinate (TOPROL XL) 25 MG 24 hr tablet Take 1 tablet (25 mg total) by mouth daily.  30 tablet  6  . Multiple Vitamins-Minerals (MULTIVITAMINS THER. W/MINERALS) TABS Take 1 tablet by mouth every morning.       . nitroGLYCERIN (NITROSTAT) 0.4 MG SL tablet Place 1 tablet (0.4 mg total) under the tongue every 5 (five) minutes as needed for chest pain.  25 tablet  3  . ondansetron (ZOFRAN) 8 MG tablet Take 1 tablet (8 mg total) by mouth every 4 (four) hours as needed for nausea.  15 tablet  0  . oxyCODONE-acetaminophen (PERCOCET) 5-325 MG per tablet Take 2 tablets by mouth every 4 (four) hours as needed for pain.  30 tablet  0  . Pancrelipase, Lip-Prot-Amyl, (CREON) 36000 UNITS CPEP TAKE 4 CAPSULES BY MOUTH DAILY WITH MEALS AND 2 CAPSULES WITH SNACKS.      Marland Kitchen potassium chloride SA (  K-DUR,KLOR-CON) 20 MEQ tablet TAKE 2 TABLETS BY MOUTH TWICE DAILY.  120 tablet  3  . sulfamethoxazole-trimethoprim (BACTRIM DS) 800-160 MG per tablet Take 1 tablet by mouth every Monday, Wednesday, and Friday.  12 tablet  5  . torsemide (DEMADEX) 20 MG tablet Take 20 mg by mouth daily as needed (for fluid retention).       Alveda Reasons 20 MG TABS tablet TAKE 1 TABLET BY MOUTH DAILY.  90 tablet  3  . Zoledronic Acid (ZOMETA IV) Inject 2 mg into the vein every 28 (twenty-eight) days.        No current facility-administered medications for this visit.   Facility-Administered Medications Ordered in Other Visits  Medication Dose Route Frequency Provider Last Rate Last Dose  . sodium chloride 0.9 % injection 10 mL  10 mL Intravenous Once Farrel Gobble, MD         Past Medical History  Diagnosis Date  . Arteriosclerotic cardiovascular disease  (ASCVD)     MI-2000s; stent to the proximal LAD and diagonal in 2001; stress nuclear in 2008-impaired exercise capacity, left ventricular dilatation, moderately to severely depressed EF, apical, inferior and anteroseptal scar  . Hypertension   . 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 Diabetes mellitus     Insulin  . GERD (gastroesophageal reflux disease)   . Pedal edema     Venous insufficiency  . Obesity   . Hyperlipidemia   . Injection site reaction   . Cellulitis of leg     both legs  . Chronic kidney disease, stage 3, mod decreased GFR     Creatinine of 1.84 in 06/2011 and 1.5 in 07/2011  . Chronic diarrhea   . Sleep apnea     uses cpap  . Ulcer   . Atrial flutter   . Multiple myeloma 07/01/2011  . Myocardial infarction 2000  . Gout     ROS:   All systems reviewed and negative except as noted in the HPI.   Past Surgical History  Procedure Laterality Date  . Laparoscopic gastric banding  2006    has been removed  . Wrist surgery      Left; removal of bone fragment  . Incision and drainage abscess anal    . Abscess drainage      Scrotal  . Bone marrow biopsy  05/13/11  . Portacath placement  07/07/2011    Procedure: INSERTION PORT-A-CATH;  Surgeon: Scherry Ran, MD;  Location: [facility];  Service: General;  Laterality: N/A;  . Colonoscopy  11/28/2011    Procedure: COLONOSCOPY;  Surgeon: Rogene Houston, MD;  [facility];  Service: Endoscopy;  Laterality: N/A;  930  . Esophagogastroduodenoscopy  01/02/2012    Procedure: ESOPHAGOGASTRODUODENOSCOPY (EGD);  Surgeon: Rogene Houston, MD;  [facility];  Service: Endoscopy;  Laterality: N/A;  100  . Esophageal biopsy  01/02/2012    Procedure: BIOPSY;  Surgeon: Rogene Houston, MD;  [facility];  Service: Endoscopy;  Laterality: N/A;  . Esophagogastroduodenoscopy N/A 09/20/2012    Procedure: ESOPHAGOGASTRODUODENOSCOPY (EGD);  Surgeon: Rogene Houston, MD;  [facility];  Service:  Endoscopy;  Laterality: N/A;  . Eus N/A 10/07/2012    Procedure: UPPER ENDOSCOPIC ULTRASOUND (EUS) LINEAR;  Surgeon: Milus Banister, MD;  Location: [facility];  Service: Endoscopy;  Laterality: N/A;  . Cardioversion N/A 10/13/2012    Procedure: CARDIOVERSION;  Surgeon: Yehuda Savannah, MD;  Location: [facility];  Service: Cardiovascular;  Laterality: N/A;  .  Cardiac catheterization      cardiac stent  . Port-a-cath removal Left 12/07/2012    Procedure: REMOVAL PORT-A-CATH;  Surgeon: Scherry Ran, MD;  Location: [facility];  Service: General;  Laterality: Left;  . Portacath placement N/A 12/07/2012    Procedure: INSERTION PORT-A-CATH;  Surgeon: Scherry Ran, MD;  Location: [facility];  Service: General;  Laterality: N/A;  Attempted portacath placement on left and right side     Family History  Problem Relation Age of Onset  . Heart disease Mother   . Cancer Mother   . Diabetes Father   . Anesthesia problems Neg Hx   . Hypotension Neg Hx   . Malignant hyperthermia Neg Hx   . Pseudochol deficiency Neg Hx   . Arthritis       History   Social History  . Marital Status: Married    Spouse Name: N/A    Number of Children: N/A  . Years of Education: 16   Occupational History  . Nurse, learning disability   . Funeral Director    Social History Main Topics  . Smoking status: Former Smoker -- 1 years    Types: Cigarettes, Cigars    Quit date: 05/17/2001  . Smokeless tobacco: Never Used  . Alcohol Use: No  . Drug Use: No  . Sexual Activity: Yes    Birth Control/ Protection: None   Other Topics Concern  . Not on file   Social History Narrative  . No narrative on file     BP 90/50  Pulse 48  Ht _0  (1.905 m)  Wt 321 lb (145.605 kg)  BMI 40.12 kg/m2  Physical Exam:  morbidly obese appearing middle aged man, NAD HEENT: Unremarkable Neck:  No JVD, no thyromegally Back:  No CVA tenderness Lungs:  Clear with no wheezes, porta cath in place. HEART:  Regular rate rhythm, no  murmurs, no rubs, no clicks Abd:  soft, positive bowel sounds, no organomegally, no rebound, no guarding Ext:  2 plus pulses, no edema, no cyanosis, no clubbing Skin:  No rashes no nodules Neuro:  CN II through XII intact, motor grossly intact  EKG - nr   Assess/Plan:

## 2013-12-21 ENCOUNTER — Other Ambulatory Visit (HOSPITAL_COMMUNITY): Payer: Self-pay | Admitting: Oncology

## 2013-12-21 ENCOUNTER — Telehealth: Payer: Self-pay | Admitting: Adult Health

## 2013-12-21 DIAGNOSIS — C9000 Multiple myeloma not having achieved remission: Secondary | ICD-10-CM

## 2013-12-21 MED ORDER — RIVAROXABAN 20 MG PO TABS: ORAL_TABLET | ORAL | Status: DC

## 2013-12-21 MED ORDER — SULFAMETHOXAZOLE-TMP DS 800-160 MG PO TABS: 1.0000 | ORAL_TABLET | ORAL | Status: DC

## 2013-12-21 NOTE — Telephone Encounter (Signed)
Received fax refill request  Rx # [id number] Medication:  Xarelto 20 mg tablet Qty 30 Sig:  Take one tablet by mouth daily Physician:  Purcell Nails

## 2013-12-29 ENCOUNTER — Other Ambulatory Visit (HOSPITAL_COMMUNITY): Payer: Self-pay | Admitting: Oncology

## 2013-12-29 DIAGNOSIS — C9000 Multiple myeloma not having achieved remission: Secondary | ICD-10-CM

## 2013-12-29 MED ORDER — LENALIDOMIDE 5 MG PO CAPS: ORAL_CAPSULE | ORAL | Status: DC

## 2014-01-05 ENCOUNTER — Ambulatory Visit (HOSPITAL_COMMUNITY): Payer: Managed Care, Other (non HMO)

## 2014-01-10 ENCOUNTER — Encounter (HOSPITAL_COMMUNITY): Payer: Managed Care, Other (non HMO) | Attending: Oncology

## 2014-01-10 VITALS — BP 97/46 | HR 52 | Temp 98.5°F | Resp 20 | Wt 327.0 lb

## 2014-01-10 DIAGNOSIS — Z9889 Other specified postprocedural states: Secondary | ICD-10-CM | POA: Diagnosis not present

## 2014-01-10 DIAGNOSIS — Z95828 Presence of other vascular implants and grafts: Secondary | ICD-10-CM

## 2014-01-10 DIAGNOSIS — X58XXXA Exposure to other specified factors, initial encounter: Secondary | ICD-10-CM | POA: Insufficient documentation

## 2014-01-10 LAB — COMPREHENSIVE METABOLIC PANEL
ALT: 126 U/L — ABNORMAL HIGH (ref 0–53)
AST: 105 U/L — ABNORMAL HIGH (ref 0–37)
Albumin: 3.4 g/dL — ABNORMAL LOW (ref 3.5–5.2)
Alkaline Phosphatase: 90 U/L (ref 39–117)
Anion gap: 13 (ref 5–15)
BUN: 45 mg/dL — ABNORMAL HIGH (ref 6–23)
CO2: 18 mEq/L — ABNORMAL LOW (ref 19–32)
Calcium: 8.2 mg/dL — ABNORMAL LOW (ref 8.4–10.5)
Chloride: 111 mEq/L (ref 96–112)
Creatinine, Ser: 1.95 mg/dL — ABNORMAL HIGH (ref 0.50–1.35)
GFR calc Af Amer: 43 mL/min — ABNORMAL LOW (ref >90)
GFR calc non Af Amer: 37 mL/min — ABNORMAL LOW (ref >90)
Glucose, Bld: 161 mg/dL — ABNORMAL HIGH (ref 70–99)
Potassium: 4.3 mEq/L (ref 3.7–5.3)
Sodium: 142 mEq/L (ref 137–147)
Total Bilirubin: 0.5 mg/dL (ref 0.3–1.2)
Total Protein: 5.8 g/dL — ABNORMAL LOW (ref 6.0–8.3)

## 2014-01-10 MED ORDER — HEPARIN SOD (PORK) LOCK FLUSH 100 UNIT/ML IV SOLN
INTRAVENOUS | Status: AC
  Filled 2014-01-10: qty 5

## 2014-01-10 MED ORDER — SODIUM CHLORIDE 0.9 % IJ SOLN
10.0000 mL | INTRAMUSCULAR | Status: DC | PRN
  Administered 2014-01-10: 10 mL via INTRAVENOUS

## 2014-01-10 MED ORDER — HEPARIN SOD (PORK) LOCK FLUSH 100 UNIT/ML IV SOLN
500.0000 [IU] | Freq: Once | INTRAVENOUS | Status: AC
  Administered 2014-01-10: 500 [IU] via INTRAVENOUS

## 2014-01-10 NOTE — Progress Notes (Signed)
Patients calcium level 8.2, Dr. Barnet Glasgow notified, Zometa held per protocol.   Roda Shutters presented for Portacath access and flush.  Portacath located right chest wall accessed with  H 20 needle.  Good blood return present. Portacath flushed with 61ml NS and 500U/15ml Heparin and needle removed intact.  Procedure tolerated well and without incident.

## 2014-01-14 ENCOUNTER — Other Ambulatory Visit (INDEPENDENT_AMBULATORY_CARE_PROVIDER_SITE_OTHER): Payer: Self-pay | Admitting: Internal Medicine

## 2014-01-16 ENCOUNTER — Ambulatory Visit (INDEPENDENT_AMBULATORY_CARE_PROVIDER_SITE_OTHER): Payer: Managed Care, Other (non HMO) | Admitting: Internal Medicine

## 2014-01-16 ENCOUNTER — Encounter: Payer: Self-pay | Admitting: Internal Medicine

## 2014-01-16 VITALS — BP 119/67 | HR 50 | Ht 75.0 in | Wt 327.0 lb

## 2014-01-16 DIAGNOSIS — E785 Hyperlipidemia, unspecified: Secondary | ICD-10-CM

## 2014-01-16 DIAGNOSIS — I5022 Chronic systolic (congestive) heart failure: Secondary | ICD-10-CM

## 2014-01-16 DIAGNOSIS — I10 Essential (primary) hypertension: Secondary | ICD-10-CM

## 2014-01-16 NOTE — Assessment & Plan Note (Signed)
His blood pressure is currently well controlled. No change in medical therapy.

## 2014-01-16 NOTE — Patient Instructions (Signed)
Your physician wants you to follow-up in: 6 MONTHS with Dr Lovena Le in [place].  You will receive a reminder letter in the mail two months in advance. If you don't receive a letter, please call our office to schedule the follow-up appointment.  Your physician recommends that you continue on your current medications as directed. Please refer to the Current Medication list given to you today.

## 2014-01-16 NOTE — Progress Notes (Signed)
HPI [name] returns today for followup. He is a very pleasant 53 year old man with multiple myeloma, morbid obesity, and ischemic cardiomyopathy, and chronic class II congestive heart failure. The patient was referred to see me several weeks ago for consideration of ICD implantation. In the interim, I discussed his prognosis with his care team as well as potential complicating features of ICD implantation. His multiple myeloma is in remission. He is on chronic suppressive therapy. His median survival is 5 years. The patient has an indwelling Port-A-Cath used for blood draws and intravenous chemotherapy infusions. He has never had syncope. He is exercising without difficulty. He is trying to lose weight. No Known Allergies   Current Outpatient Prescriptions  Medication Sig Dispense Refill  . acyclovir (ZOVIRAX) 400 MG tablet Take 1 tablet (400 mg total) by mouth daily.  30 tablet  3  . allopurinol (ZYLOPRIM) 300 MG tablet Take 1 tablet (300 mg total) by mouth daily.  30 tablet  3  . atorvastatin (LIPITOR) 80 MG tablet TAKE ONE TABLET BY MOUTH AT BEDTIME.  30 tablet  6  . calcitRIOL (ROCALTROL) 0.25 MCG capsule Take 0.25 mcg by mouth daily.      . calcium-vitamin D (OSCAL WITH D) 500-200 MG-UNIT per tablet Take 1 tablet by mouth 2 (two) times daily.       Marland Kitchen dexamethasone (DECADRON) 4 MG tablet TAKE 5 TABLETS BY MOUTH TWICE DAILY EVERY FRIDAY.  40 tablet  3  . dexlansoprazole (DEXILANT) 60 MG capsule Take 60 mg by mouth every morning.       . diphenoxylate-atropine (LOMOTIL) 2.5-0.025 MG per tablet TAKE 2 TABLETS BY MOUTH EVERY MORNING.MAX 8 A DAY.  120 tablet  0  . ergocalciferol (VITAMIN D2) 50000 UNITS capsule 50,000 units weekly x 4 weeks and then 50,000 units monthly  20 capsule  1  . HYDROcodone-acetaminophen (NORCO/VICODIN) 5-325 MG per tablet Take 1-2 tablets by mouth every 4 (four) hours as needed.  17 tablet  0  . insulin detemir (LEVEMIR) 100 UNIT/ML injection Inject 40 Units  into the skin every morning.       . insulin lispro (HUMALOG) 100 UNIT/ML injection Inject into the skin 2 (two) times daily with a meal. Sliding Scale per patient      . lenalidomide ([name]) 5 MG capsule Take 1 tablet PO 7 days on and 7 days off.  14 capsule  0  . lidocaine-prilocaine (EMLA) cream Apply 1 application topically as needed (for port-a-cath access).       Marland Kitchen lisinopril (PRINIVIL,ZESTRIL) 5 MG tablet Take 5 mg by mouth every other day.      . loratadine (CLARITIN) 10 MG tablet Take 10 mg by mouth every morning.       . metoprolol succinate (TOPROL XL) 25 MG 24 hr tablet Take 1 tablet (25 mg total) by mouth daily.  30 tablet  6  . Multiple Vitamins-Minerals (MULTIVITAMINS THER. W/MINERALS) TABS Take 1 tablet by mouth every morning.       . nitroGLYCERIN (NITROSTAT) 0.4 MG SL tablet Place 1 tablet (0.4 mg total) under the tongue every 5 (five) minutes as needed for chest pain.  25 tablet  3  . ondansetron (ZOFRAN) 8 MG tablet Take 1 tablet (8 mg total) by mouth every 4 (four) hours as needed for nausea.  15 tablet  0  . oxyCODONE-acetaminophen (PERCOCET) 5-325 MG per tablet Take 2 tablets by mouth every 4 (four) hours as needed for pain.  30 tablet  0  . Pancrelipase, Lip-Prot-Amyl, (CREON) 36000 UNITS CPEP TAKE 4 CAPSULES BY MOUTH DAILY WITH MEALS AND 2 CAPSULES WITH SNACKS.      Marland Kitchen potassium chloride SA (K-DUR,KLOR-CON) 20 MEQ tablet TAKE 2 TABLETS BY MOUTH TWICE DAILY.  120 tablet  3  . rivaroxaban (XARELTO) 20 MG TABS tablet TAKE 1 TABLET BY MOUTH DAILY.  90 tablet  3  . sulfamethoxazole-trimethoprim (BACTRIM DS) 800-160 MG per tablet Take 1 tablet by mouth every Monday, Wednesday, and Friday.  12 tablet  5  . torsemide (DEMADEX) 20 MG tablet Take 20 mg by mouth daily as needed (for fluid retention).       . Zoledronic Acid (ZOMETA IV) Inject 2 mg into the vein every 28 (twenty-eight) days.        No current facility-administered medications for this visit.   Facility-Administered  Medications Ordered in Other Visits  Medication Dose Route Frequency Provider Last Rate Last Dose  . sodium chloride 0.9 % injection 10 mL  10 mL Intravenous Once Farrel Gobble, MD         Past Medical History  Diagnosis Date  . Arteriosclerotic cardiovascular disease (ASCVD)     MI-2000s; stent to the proximal LAD and diagonal in 2001; stress nuclear in 2008-impaired exercise capacity, left ventricular dilatation, moderately to severely depressed EF, apical, inferior and anteroseptal scar  . Hypertension   . 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 Diabetes mellitus     Insulin  . GERD (gastroesophageal reflux disease)   . Pedal edema     Venous insufficiency  . Obesity   . Hyperlipidemia   . Injection site reaction   . Cellulitis of leg     both legs  . Chronic kidney disease, stage 3, mod decreased GFR     Creatinine of 1.84 in 06/2011 and 1.5 in 07/2011  . Chronic diarrhea   . Sleep apnea     uses cpap  . Ulcer   . Atrial flutter   . Multiple myeloma 07/01/2011  . Myocardial infarction 2000  . Gout     ROS:   All systems reviewed and negative except as noted in the HPI.   Past Surgical History  Procedure Laterality Date  . Laparoscopic gastric banding  2006    has been removed  . Wrist surgery      Left; removal of bone fragment  . Incision and drainage abscess anal    . Abscess drainage      Scrotal  . Bone marrow biopsy  05/13/11  . Portacath placement  07/07/2011    Procedure: INSERTION PORT-A-CATH;  Surgeon: Scherry Ran, MD;  Location: [facility];  Service: General;  Laterality: N/A;  . Colonoscopy  11/28/2011    Procedure: COLONOSCOPY;  Surgeon: Rogene Houston, MD;  Location: [facility];  Service: Endoscopy;  Laterality: N/A;  930  . Esophagogastroduodenoscopy  01/02/2012    Procedure: ESOPHAGOGASTRODUODENOSCOPY (EGD);  Surgeon: Rogene Houston, MD;  Location: [facility];  Service: Endoscopy;  Laterality: N/A;  100  . Esophageal biopsy  01/02/2012     Procedure: BIOPSY;  Surgeon: Rogene Houston, MD;  Location: [facility];  Service: Endoscopy;  Laterality: N/A;  . Esophagogastroduodenoscopy N/A 09/20/2012    Procedure: ESOPHAGOGASTRODUODENOSCOPY (EGD);  Surgeon: Rogene Houston, MD;  Location: [facility];  Service: Endoscopy;  Laterality: N/A;  . Eus N/A 10/07/2012    Procedure: UPPER ENDOSCOPIC ULTRASOUND (EUS) LINEAR;  Surgeon: Milus Banister, MD;  Location: [facility];  Service: Endoscopy;  Laterality: N/A;  . Cardioversion N/A 10/13/2012    Procedure: CARDIOVERSION;  Surgeon: Yehuda Savannah, MD;  Location: [facility];  Service: Cardiovascular;  Laterality: N/A;  . Cardiac catheterization      cardiac stent  . Port-a-cath removal Left 12/07/2012    Procedure: REMOVAL PORT-A-CATH;  Surgeon: Scherry Ran, MD;  Location: [facility];  Service: General;  Laterality: Left;  . Portacath placement N/A 12/07/2012    Procedure: INSERTION PORT-A-CATH;  Surgeon: Scherry Ran, MD;  Location: [facility];  Service: General;  Laterality: N/A;  Attempted portacath placement on left and right side     Family History  Problem Relation Age of Onset  . Heart disease Mother   . Cancer Mother   . Diabetes Father   . Anesthesia problems Neg Hx   . Hypotension Neg Hx   . Malignant hyperthermia Neg Hx   . Pseudochol deficiency Neg Hx   . Arthritis       History   Social History  . Marital Status: Married    Spouse Name: N/A    Number of Children: N/A  . Years of Education: 16   Occupational History  . Nurse, learning disability   . Funeral Director    Social History Main Topics  . Smoking status: Former Smoker -- 1 years    Types: Cigarettes, Cigars    Quit date: 05/17/2001  . Smokeless tobacco: Never Used  . Alcohol Use: No  . Drug Use: No  . Sexual Activity: Yes    Birth Control/ Protection: None   Other Topics Concern  . Not on file   Social History Narrative  . No narrative on file     BP 119/67  Pulse 50  Ht _0  (1.905 m)   Wt 327 lb (148.326 kg)  BMI 40.87 kg/m2  Physical Exam:  Morbidly obese appearing middle-aged man, NAD HEENT: Unremarkable Neck:  No JVD, no thyromegally Back:  No CVA tenderness Lungs:  Clear with no wheezes, rales, or rhonchi. HEART:  Regular rate rhythm, no murmurs, no rubs, no clicks Abd:  soft, positive bowel sounds, no organomegally, no rebound, no guarding Ext:  2 plus pulses, no edema, no cyanosis, no clubbing Skin:  No rashes no nodules Neuro:  CN II through XII intact, motor grossly intact  EKG - sinus bradycardia with first degree AV block, old anteroseptal MI.   Assess/Plan:

## 2014-01-16 NOTE — Assessment & Plan Note (Signed)
His chronic systolic heart failure appears to be well compensated. His symptoms are class II. This is despite severe left ventricular dysfunction. ICD implantation would be a consideration in this patient, but his comorbidities including indwelling Port-A-Cath, as well as multiple myeloma in remission, as well as renal insufficiency, as well as morbid obesity, make ICD implantation relatively contraindicated.

## 2014-01-25 ENCOUNTER — Encounter (HOSPITAL_COMMUNITY): Payer: Self-pay | Admitting: Pharmacy Technician

## 2014-02-03 ENCOUNTER — Encounter (HOSPITAL_COMMUNITY): Payer: Self-pay

## 2014-02-03 NOTE — Patient Instructions (Addendum)
Your procedure is scheduled on:  Monday, 02/13/14  Report to [name] at  [place]  AM.  Call this number if you have problems the morning of surgery: [phone]   Remember:   Do not eat or drink   After Midnight.  Take these medicines the morning of surgery with A SIP OF WATER: norco if needed, metoprolol, lisinopril, dexilant, 1/2 your usual dose of Lantus the night before surgery. DO NOT take any insulin the day of surgery. Please call concerning any high or low blood sugars the morning of surgery.   Do not wear jewelry, make-up or nail polish.  Do not wear lotions, powders, or perfumes.   Do not bring valuables to the hospital.  Contacts, dentures or bridgework may not be worn into surgery.     Patients discharged the day of surgery will not be allowed to drive home.  Name and phone number of your driver: driver  Special Instructions: Use eye drops as directed.   Please read over the following fact sheets that you were given: Pain Booklet, Anesthesia Post-op Instructions and Care and Recovery After Surgery    Cataract Surgery  A cataract is a clouding of the lens of the eye. When a lens becomes cloudy, vision is reduced based on the degree and nature of the clouding. Surgery may be needed to improve vision. Surgery removes the cloudy lens and usually replaces it with a substitute lens (intraocular lens, IOL). LET YOUR EYE DOCTOR KNOW ABOUT:  Allergies to food or medicine.   Medicines taken including herbs, eyedrops, over-the-counter medicines, and creams.   Use of steroids (by mouth or creams).   Previous problems with anesthetics or numbing medicine.   History of bleeding problems or blood clots.   Previous surgery.   Other health problems, including diabetes and kidney problems.   Possibility of pregnancy, if this applies.  RISKS AND COMPLICATIONS  Infection.   Inflammation of the eyeball (endophthalmitis) that can spread to both eyes (sympathetic ophthalmia).   Poor  wound healing.   If an IOL is inserted, it can later fall out of proper position. This is very uncommon.   Clouding of the part of your eye that holds an IOL in place. This is called an "after-cataract." These are uncommon, but easily treated.  BEFORE THE PROCEDURE  Do not eat or drink anything except small amounts of water for 8 to 12 before your surgery, or as directed by your caregiver.   Unless you are told otherwise, continue any eyedrops you have been prescribed.   Talk to your primary caregiver about all other medicines that you take (both prescription and non-prescription). In some cases, you may need to stop or change medicines near the time of your surgery. This is most important if you are taking blood-thinning medicine.Do not stop medicines unless you are told to do so.   Arrange for someone to drive you to and from the procedure.   Do not put contact lenses in either eye on the day of your surgery.  PROCEDURE There is more than one method for safely removing a cataract. Your doctor can explain the differences and help determine which is best for you. Phacoemulsification surgery is the most common form of cataract surgery.  An injection is given behind the eye or eyedrops are given to make this a painless procedure.   A small cut (incision) is made on the edge of the clear, dome-shaped surface that covers the front of the eye (cornea).  A tiny probe is painlessly inserted into the eye. This device gives off ultrasound waves that soften and break up the cloudy center of the lens. This makes it easier for the cloudy lens to be removed by suction.   An IOL may be implanted.   The normal lens of the eye is covered by a clear capsule. Part of that capsule is intentionally left in the eye to support the IOL.   Your surgeon may or may not use stitches to close the incision.  There are other forms of cataract surgery that require a larger incision and stiches to close the eye.  This approach is taken in cases where the doctor feels that the cataract cannot be easily removed using phacoemulsification. AFTER THE PROCEDURE  When an IOL is implanted, it does not need care. It becomes a permanent part of your eye and cannot be seen or felt.   Your doctor will schedule follow-up exams to check on your progress.   Review your other medicines with your doctor to see which can be resumed after surgery.   Use eyedrops or take medicine as prescribed by your doctor.  Document Released: 05/08/2011 Document Reviewed: 05/05/2011 [facility] Patient Information 2012 [place].  PATIENT INSTRUCTIONS POST-ANESTHESIA  IMMEDIATELY FOLLOWING SURGERY:  Do not drive or operate machinery for the first twenty four hours after surgery.  Do not make any important decisions for twenty four hours after surgery or while taking narcotic pain medications or sedatives.  If you develop intractable nausea and vomiting or a severe headache please notify your doctor immediately.  FOLLOW-UP:  Please make an appointment with your surgeon as instructed. You do not need to follow up with anesthesia unless specifically instructed to do so.  WOUND CARE INSTRUCTIONS (if applicable):  Keep a dry clean dressing on the anesthesia/puncture wound site if there is drainage.  Once the wound has quit draining you may leave it open to air.  Generally you should leave the bandage intact for twenty four hours unless there is drainage.  If the epidural site drains for more than 36-48 hours please call the anesthesia department.  QUESTIONS?:  Please feel free to call your physician or the hospital operator if you have any questions, and they will be happy to assist you.

## 2014-02-07 ENCOUNTER — Encounter (HOSPITAL_COMMUNITY)
Admission: RE | Admit: 2014-02-07 | Discharge: 2014-02-07 | Disposition: A | Discharge status: Home / Self Care | Payer: Managed Care, Other (non HMO) | Source: Ambulatory Visit | Attending: Ophthalmology | Admitting: Ophthalmology

## 2014-02-07 ENCOUNTER — Encounter (HOSPITAL_COMMUNITY): Payer: Self-pay | Admitting: Pharmacy Technician

## 2014-02-07 ENCOUNTER — Encounter (HOSPITAL_COMMUNITY): Payer: Self-pay

## 2014-02-07 DIAGNOSIS — H2589 Other age-related cataract: Secondary | ICD-10-CM | POA: Insufficient documentation

## 2014-02-07 DIAGNOSIS — Z01818 Encounter for other preprocedural examination: Secondary | ICD-10-CM | POA: Insufficient documentation

## 2014-02-07 LAB — HEMOGLOBIN AND HEMATOCRIT, BLOOD
HCT: 31.4 % — ABNORMAL LOW (ref 39.0–52.0)
Hemoglobin: 10.5 g/dL — ABNORMAL LOW (ref 13.0–17.0)

## 2014-02-07 NOTE — Progress Notes (Signed)
-   No show, letter sent- KEFALAS,[name]   

## 2014-02-09 ENCOUNTER — Ambulatory Visit (HOSPITAL_COMMUNITY): Payer: Managed Care, Other (non HMO) | Admitting: Oncology

## 2014-02-09 ENCOUNTER — Telehealth (HOSPITAL_COMMUNITY): Payer: Self-pay | Admitting: Emergency Medicine

## 2014-02-09 ENCOUNTER — Ambulatory Visit (HOSPITAL_COMMUNITY): Payer: Managed Care, Other (non HMO)

## 2014-02-09 NOTE — Telephone Encounter (Signed)
No encounter

## 2014-02-10 ENCOUNTER — Ambulatory Visit (HOSPITAL_COMMUNITY): Payer: Managed Care, Other (non HMO)

## 2014-02-10 ENCOUNTER — Ambulatory Visit (HOSPITAL_COMMUNITY): Payer: Managed Care, Other (non HMO) | Admitting: Oncology

## 2014-02-10 MED ORDER — CYCLOPENTOLATE-PHENYLEPHRINE OP SOLN OPTIME - NO CHARGE
OPHTHALMIC | Status: AC
  Filled 2014-02-10: qty 2

## 2014-02-10 MED ORDER — LIDOCAINE HCL 3.5 % OP GEL
OPHTHALMIC | Status: AC
  Filled 2014-02-10: qty 1

## 2014-02-10 MED ORDER — PHENYLEPHRINE HCL 2.5 % OP SOLN
OPHTHALMIC | Status: AC
  Filled 2014-02-10: qty 15

## 2014-02-10 MED ORDER — LIDOCAINE HCL (PF) 1 % IJ SOLN
INTRAMUSCULAR | Status: AC
  Filled 2014-02-10: qty 2

## 2014-02-10 MED ORDER — TETRACAINE HCL 0.5 % OP SOLN
OPHTHALMIC | Status: AC
  Filled 2014-02-10: qty 2

## 2014-02-10 MED ORDER — NEOMYCIN-POLYMYXIN-DEXAMETH 3.5-10000-0.1 OP SUSP
OPHTHALMIC | Status: AC
  Filled 2014-02-10: qty 5

## 2014-02-13 ENCOUNTER — Encounter (HOSPITAL_COMMUNITY): Payer: Self-pay | Admitting: *Deleted

## 2014-02-13 ENCOUNTER — Ambulatory Visit (HOSPITAL_COMMUNITY)
Admission: RE | Admit: 2014-02-13 | Discharge: 2014-02-13 | Disposition: A | Discharge status: Home / Self Care | Payer: Managed Care, Other (non HMO) | Source: Ambulatory Visit | Attending: Ophthalmology | Admitting: Ophthalmology

## 2014-02-13 ENCOUNTER — Encounter (HOSPITAL_COMMUNITY): Payer: Managed Care, Other (non HMO) | Admitting: Anesthesiology

## 2014-02-13 ENCOUNTER — Ambulatory Visit (HOSPITAL_COMMUNITY): Payer: Managed Care, Other (non HMO) | Admitting: Anesthesiology

## 2014-02-13 ENCOUNTER — Encounter (HOSPITAL_COMMUNITY)
Admission: RE | Disposition: A | Discharge status: Home / Self Care | Payer: Self-pay | Source: Ambulatory Visit | Attending: Ophthalmology

## 2014-02-13 DIAGNOSIS — I10 Essential (primary) hypertension: Secondary | ICD-10-CM | POA: Insufficient documentation

## 2014-02-13 DIAGNOSIS — D649 Anemia, unspecified: Secondary | ICD-10-CM | POA: Diagnosis not present

## 2014-02-13 DIAGNOSIS — Z87891 Personal history of nicotine dependence: Secondary | ICD-10-CM | POA: Insufficient documentation

## 2014-02-13 DIAGNOSIS — E119 Type 2 diabetes mellitus without complications: Secondary | ICD-10-CM | POA: Insufficient documentation

## 2014-02-13 DIAGNOSIS — Z794 Long term (current) use of insulin: Secondary | ICD-10-CM | POA: Diagnosis not present

## 2014-02-13 DIAGNOSIS — N289 Disorder of kidney and ureter, unspecified: Secondary | ICD-10-CM | POA: Insufficient documentation

## 2014-02-13 DIAGNOSIS — H2589 Other age-related cataract: Secondary | ICD-10-CM | POA: Insufficient documentation

## 2014-02-13 DIAGNOSIS — K219 Gastro-esophageal reflux disease without esophagitis: Secondary | ICD-10-CM | POA: Insufficient documentation

## 2014-02-13 DIAGNOSIS — Z79899 Other long term (current) drug therapy: Secondary | ICD-10-CM | POA: Insufficient documentation

## 2014-02-13 HISTORY — PX: CATARACT EXTRACTION W/PHACO: SHX586

## 2014-02-13 LAB — GLUCOSE, CAPILLARY: Glucose-Capillary: 121 mg/dL — ABNORMAL HIGH (ref 70–99)

## 2014-02-13 SURGERY — PHACOEMULSIFICATION, CATARACT, WITH IOL INSERTION
Anesthesia: Monitor Anesthesia Care | Site: Eye | Laterality: Left

## 2014-02-13 MED ORDER — MIDAZOLAM HCL 2 MG/2ML IJ SOLN
1.0000 mg | INTRAMUSCULAR | Status: DC | PRN
  Administered 2014-02-13: 2 mg via INTRAVENOUS

## 2014-02-13 MED ORDER — PHENYLEPHRINE HCL 2.5 % OP SOLN
1.0000 [drp] | OPHTHALMIC | Status: AC
  Administered 2014-02-13 (×3): 1 [drp] via OPHTHALMIC

## 2014-02-13 MED ORDER — LIDOCAINE 3.5 % OP GEL OPTIME - NO CHARGE
OPHTHALMIC | Status: DC | PRN
  Administered 2014-02-13: 2 [drp] via OPHTHALMIC

## 2014-02-13 MED ORDER — EPINEPHRINE HCL 1 MG/ML IJ SOLN
INTRAOCULAR | Status: DC | PRN
  Administered 2014-02-13: 08:00:00

## 2014-02-13 MED ORDER — EPINEPHRINE HCL 1 MG/ML IJ SOLN
INTRAMUSCULAR | Status: AC
  Filled 2014-02-13: qty 1

## 2014-02-13 MED ORDER — LIDOCAINE HCL (PF) 1 % IJ SOLN
INTRAOCULAR | Status: DC | PRN
  Administered 2014-02-13: 08:00:00 via OPHTHALMIC

## 2014-02-13 MED ORDER — NA HYALUR & NA CHOND-NA HYALUR 0.55-0.5 ML IO KIT
PACK | INTRAOCULAR | Status: DC | PRN
  Administered 2014-02-13: 1 via OPHTHALMIC

## 2014-02-13 MED ORDER — TETRACAINE HCL 0.5 % OP SOLN
1.0000 [drp] | OPHTHALMIC | Status: AC
Start: 2014-02-13 — End: 2014-02-13
  Administered 2014-02-13 (×3): 1 [drp] via OPHTHALMIC

## 2014-02-13 MED ORDER — FENTANYL CITRATE 0.05 MG/ML IJ SOLN
INTRAMUSCULAR | Status: AC
  Filled 2014-02-13: qty 2

## 2014-02-13 MED ORDER — CYCLOPENTOLATE-PHENYLEPHRINE 0.2-1 % OP SOLN
1.0000 [drp] | OPHTHALMIC | Status: AC
  Administered 2014-02-13 (×3): 1 [drp] via OPHTHALMIC

## 2014-02-13 MED ORDER — MIDAZOLAM HCL 2 MG/2ML IJ SOLN
INTRAMUSCULAR | Status: AC
  Filled 2014-02-13: qty 2

## 2014-02-13 MED ORDER — NEOMYCIN-POLYMYXIN-DEXAMETH 3.5-10000-0.1 OP SUSP
OPHTHALMIC | Status: DC | PRN
  Administered 2014-02-13: 2 [drp] via OPHTHALMIC

## 2014-02-13 MED ORDER — LIDOCAINE HCL 3.5 % OP GEL
1.0000 "application " | Freq: Once | OPHTHALMIC | Status: AC
  Administered 2014-02-13: 1 via OPHTHALMIC

## 2014-02-13 MED ORDER — FENTANYL CITRATE 0.05 MG/ML IJ SOLN
25.0000 ug | INTRAMUSCULAR | Status: AC
  Administered 2014-02-13 (×2): 25 ug via INTRAVENOUS

## 2014-02-13 MED ORDER — SODIUM CHLORIDE 0.9 % IV SOLN
INTRAVENOUS | Status: DC
Start: 2014-02-13 — End: 2014-02-13
  Administered 2014-02-13: 08:00:00 via INTRAVENOUS

## 2014-02-13 MED ORDER — BSS IO SOLN
INTRAOCULAR | Status: DC | PRN
  Administered 2014-02-13: 15 mL via INTRAOCULAR

## 2014-02-13 MED ORDER — KETOROLAC TROMETHAMINE 0.5 % OP SOLN: 1.0000 [drp] | OPHTHALMIC | Status: DC

## 2014-02-13 MED ORDER — POVIDONE-IODINE 5 % OP SOLN
OPHTHALMIC | Status: DC | PRN
  Administered 2014-02-13: 1 via OPHTHALMIC

## 2014-02-13 SURGICAL SUPPLY — 33 items
CAPSULAR TENSION RING-AMO (OPHTHALMIC RELATED) IMPLANT
CLOTH BEACON ORANGE TIMEOUT ST (SAFETY) ×2 IMPLANT
EYE SHIELD UNIVERSAL CLEAR (GAUZE/BANDAGES/DRESSINGS) ×2 IMPLANT
GLOVE BIO SURGEON STRL SZ 6.5 (GLOVE) IMPLANT
GLOVE BIOGEL PI IND STRL 6.5 (GLOVE) ×1 IMPLANT
GLOVE BIOGEL PI IND STRL 7.0 (GLOVE) IMPLANT
GLOVE BIOGEL PI IND STRL 7.5 (GLOVE) IMPLANT
GLOVE BIOGEL PI INDICATOR 6.5 (GLOVE) ×1
GLOVE BIOGEL PI INDICATOR 7.0 (GLOVE)
GLOVE BIOGEL PI INDICATOR 7.5 (GLOVE)
GLOVE ECLIPSE 6.5 STRL STRAW (GLOVE) IMPLANT
GLOVE ECLIPSE 7.0 STRL STRAW (GLOVE) IMPLANT
GLOVE ECLIPSE 7.5 STRL STRAW (GLOVE) IMPLANT
GLOVE EXAM NITRILE LRG STRL (GLOVE) IMPLANT
GLOVE EXAM NITRILE MD LF STRL (GLOVE) ×2 IMPLANT
GLOVE SKINSENSE NS SZ6.5 (GLOVE)
GLOVE SKINSENSE NS SZ7.0 (GLOVE)
GLOVE SKINSENSE STRL SZ6.5 (GLOVE) IMPLANT
GLOVE SKINSENSE STRL SZ7.0 (GLOVE) IMPLANT
KIT VITRECTOMY (OPHTHALMIC RELATED) IMPLANT
PAD ARMBOARD 7.5X6 YLW CONV (MISCELLANEOUS) ×2 IMPLANT
PROC W NO LENS (INTRAOCULAR LENS)
PROC W SPEC LENS (INTRAOCULAR LENS)
PROCESS W NO LENS (INTRAOCULAR LENS) IMPLANT
PROCESS W SPEC LENS (INTRAOCULAR LENS) IMPLANT
RETRACTOR IRIS SIGHTPATH (OPHTHALMIC RELATED) IMPLANT
RING MALYGIN (MISCELLANEOUS) IMPLANT
SIGHTPATH CAT PROC W REG LENS (Ophthalmic Related) ×2 IMPLANT
SYRINGE LUER LOK 1CC (MISCELLANEOUS) ×2 IMPLANT
TAPE SURG TRANSPORE 1 IN (GAUZE/BANDAGES/DRESSINGS) ×1 IMPLANT
TAPE SURGICAL TRANSPORE 1 IN (GAUZE/BANDAGES/DRESSINGS) ×1
VISCOELASTIC ADDITIONAL (OPHTHALMIC RELATED) IMPLANT
WATER STERILE IRR 250ML POUR (IV SOLUTION) ×2 IMPLANT

## 2014-02-13 NOTE — H&P (Signed)
I have reviewed the H&P, the patient was re-examined, and I have identified no interval changes in medical condition and plan of care since the history and physical of record  

## 2014-02-13 NOTE — Anesthesia Postprocedure Evaluation (Signed)
  Anesthesia Post-op Note  Patient: [name]  Procedure(s) Performed: Procedure(s) with comments: CATARACT EXTRACTION PHACO AND INTRAOCULAR LENS PLACEMENT (IOC) (Left) - CDE:  7.67  Patient Location: Short Stay  Anesthesia Type:MAC  Level of Consciousness: awake, alert , oriented and patient cooperative  Airway and Oxygen Therapy: Patient Spontanous Breathing  Post-op Pain: none  Post-op Assessment: Post-op Vital signs reviewed, Patient's Cardiovascular Status Stable, Respiratory Function Stable, Patent Airway and Pain level controlled  Post-op Vital Signs: Reviewed and stable  Last Vitals:  Filed Vitals:   02/13/14 0755  BP: 87/59  Temp:   Resp: 18    Complications: No apparent anesthesia complications

## 2014-02-13 NOTE — Discharge Instructions (Signed)

## 2014-02-13 NOTE — Anesthesia Preprocedure Evaluation (Signed)
Anesthesia Evaluation  Patient identified by MRN, date of birth, ID band Patient awake    Reviewed: Allergy & Precautions, H&P , NPO status , Patient's Chart, lab work & pertinent test results  Airway Mallampati: II TM Distance: >3 FB     Dental  (+) Teeth Intact, Caps   Pulmonary sleep apnea and Continuous Positive Airway Pressure Ventilation , former smoker,  breath sounds clear to auscultation        Cardiovascular hypertension, Pt. on medications + CAD, + Past MI and + Cardiac Stents Rhythm:Regular Rate:Normal     Neuro/Psych    GI/Hepatic GERD-  Medicated and Controlled,  Endo/Other  diabetes, Well Controlled, Type 2, Oral Hypoglycemic Agents  Renal/GU Renal InsufficiencyRenal disease     Musculoskeletal  (+) Arthritis -,   Abdominal   Peds  Hematology  (+) anemia ,   Anesthesia Other Findings   Reproductive/Obstetrics                           Anesthesia Physical Anesthesia Plan  ASA: III  Anesthesia Plan: MAC   Post-op Pain Management:    Induction: Intravenous  Airway Management Planned: Nasal Cannula  Additional Equipment:   Intra-op Plan:   Post-operative Plan:   Informed Consent: I have reviewed the patients History and Physical, chart, labs and discussed the procedure including the risks, benefits and alternatives for the proposed anesthesia with the patient or authorized representative who has indicated his/her understanding and acceptance.     Plan Discussed with:   Anesthesia Plan Comments:         Anesthesia Quick Evaluation

## 2014-02-13 NOTE — Transfer of Care (Signed)
Immediate Anesthesia Transfer of Care Note  Patient: [name]  Procedure(s) Performed: Procedure(s) with comments: CATARACT EXTRACTION PHACO AND INTRAOCULAR LENS PLACEMENT (IOC) (Left) - CDE:  7.67  Patient Location: Short Stay  Anesthesia Type:MAC  Level of Consciousness: awake, alert , oriented and patient cooperative  Airway & Oxygen Therapy: Patient Spontanous Breathing  Post-op Assessment: Report given to PACU RN, Post -op Vital signs reviewed and stable and Patient moving all extremities  Post vital signs: Reviewed and stable  Complications: No apparent anesthesia complications

## 2014-02-13 NOTE — Op Note (Signed)
Date of Admission: 02/13/2014  Date of Surgery: 02/13/2014   Pre-Op Dx: Cataract Left Eye  Post-Op Dx: Senile Combined  Cataract Left  Eye,  Dx Code 366.19  Surgeon: Tonny Branch, M.D.  Assistants: None  Anesthesia: Topical with MAC  Indications: Painless, progressive loss of vision with compromise of daily activities.  Surgery: Cataract Extraction with Intraocular lens Implant Left Eye  Discription: The patient had dilating drops and viscous lidocaine placed into the Left eye in the pre-op holding area. After transfer to the operating room, a time out was performed. The patient was then prepped and draped. Beginning with a 31 degree blade a paracentesis port was made at the surgeon's 2 o'clock position. The anterior chamber was then filled with 1% non-preserved lidocaine. This was followed by filling the anterior chamber with Provisc.  A 2.61mm keratome blade was used to make a clear corneal incision at the temporal limbus.  A bent cystatome needle was used to create a continuous tear capsulotomy. Hydrodissection was performed with balanced salt solution on a Fine canula. The lens nucleus was then removed using the phacoemulsification handpiece. Residual cortex was removed with the I&A handpiece. The anterior chamber and capsular bag were refilled with Provisc. A posterior chamber intraocular lens was placed into the capsular bag with it's injector. The implant was positioned with the Kuglan hook. The Provisc was then removed from the anterior chamber and capsular bag with the I&A handpiece. Stromal hydration of the main incision and paracentesis port was performed with BSS on a Fine canula. The wounds were tested for leak which was negative. The patient tolerated the procedure well. There were no operative complications. The patient was then transferred to the recovery room in stable condition.  Complications: None  Specimen: None  EBL: None  Prosthetic device: Hoya iSert 250, power 21.0 D, SN  [id number].

## 2014-02-14 ENCOUNTER — Encounter (HOSPITAL_COMMUNITY): Payer: Self-pay | Admitting: Ophthalmology

## 2014-02-19 NOTE — Progress Notes (Signed)
Franklin Font, MD [address] [place]  [id number]  Multiple myeloma - Plan: Basic metabolic panel, zolendronic acid (ZOMETA) 2 mg in sodium chloride 0.9 % 100 mL IVPB, Schedule Portacath Flush Appointment, heparin lock flush 100 unit/mL, sodium chloride 0.9 % injection 10 mL  CURRENT THERAPY: Lenalidomide 5 mg 7 days on and 7 days off with 20 mg Dexamethasone BID every Friday only. Zometa every 4 weeks.  INTERVAL HISTORY: [name] 53 y.o. male returns for  regular  visit for followup of kappa light chain multiple myeloma, stage II disease presently on maintenance [name] 5 mg daily 7 days on, 7 days off along with weekly dexamethasone on Friday of every week. Zometa every 4 weeks.   I personally reviewed and went over laboratory results with the patient.  The results are noted within this dictation.  He is noted to be hypocalcemic, but corrected calcium is WNL at 8.6.  Therefore he can be treated with Zometa today.  He is doing well from an hematologic standpoint.  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  He is starting to think about the future and what he is going to do with the business when he passes away.  Hematologically, he denies any complaints and ROS questioning is negative.    Past Medical History  Diagnosis Date  . Arteriosclerotic cardiovascular disease (ASCVD)     MI-2000s; stent to the proximal LAD and diagonal in 2001; stress nuclear in 2008-impaired exercise capacity, left ventricular dilatation, moderately to severely depressed EF, apical, inferior and anteroseptal scar  . Hypertension   . 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 Diabetes mellitus     Insulin  . GERD (gastroesophageal reflux disease)   . Pedal edema     Venous insufficiency  . Obesity   . Hyperlipidemia   . Injection site reaction   . Cellulitis of leg     both legs  . Chronic kidney disease, stage 3, mod decreased GFR     Creatinine of 1.84 in  06/2011 and 1.5 in 07/2011  . Chronic diarrhea   . Sleep apnea     uses cpap  . Ulcer   . Atrial flutter   . Multiple myeloma 07/01/2011  . Myocardial infarction 2000  . Gout     has DIABETES MELLITUS, TYPE II, ON INSULIN; Morbid obesity; SLEEP APNEA; Multiple myeloma; Arteriosclerotic cardiovascular disease (ASCVD); Hypertension; Hyperlipidemia; DDD (degenerative disc disease), cervical; Atrial flutter; Chronic pancreatitis; Anemia, normocytic normochromic; and Chronic systolic heart failure on his problem list.     has No Known Allergies.  [name] does not currently have medications on file.  Past Surgical History  Procedure Laterality Date  . Laparoscopic gastric banding  2006    has been removed  . Wrist surgery      Left; removal of bone fragment  . Incision and drainage abscess anal    . Abscess drainage      Scrotal  . Bone marrow biopsy  05/13/11  . Portacath placement  07/07/2011    Procedure: INSERTION PORT-A-CATH;  Surgeon: Scherry Ran, MD;  Location: [facility];  Service: General;  Laterality: N/A;  . Colonoscopy  11/28/2011    Procedure: COLONOSCOPY;  Surgeon: Rogene Houston, MD;  Location: [facility];  Service: Endoscopy;  Laterality: N/A;  930  . Esophagogastroduodenoscopy  01/02/2012    Procedure: ESOPHAGOGASTRODUODENOSCOPY (EGD);  Surgeon: Rogene Houston, MD;  Location: [facility];  Service: Endoscopy;  Laterality: N/A;  100  . Esophageal biopsy  01/02/2012    Procedure: BIOPSY;  Surgeon: Rogene Houston, MD;  Location: [facility];  Service: Endoscopy;  Laterality: N/A;  . Esophagogastroduodenoscopy N/A 09/20/2012    Procedure: ESOPHAGOGASTRODUODENOSCOPY (EGD);  Surgeon: Rogene Houston, MD;  Location: [facility];  Service: Endoscopy;  Laterality: N/A;  . Eus N/A 10/07/2012    Procedure: UPPER ENDOSCOPIC ULTRASOUND (EUS) LINEAR;  Surgeon: Milus Banister, MD;  Location: [facility];  Service: Endoscopy;  Laterality: N/A;  . Cardioversion N/A 10/13/2012     Procedure: CARDIOVERSION;  Surgeon: Yehuda Savannah, MD;  Location: [facility];  Service: Cardiovascular;  Laterality: N/A;  . Cardiac catheterization      cardiac stent  . Port-a-cath removal Left 12/07/2012    Procedure: REMOVAL PORT-A-CATH;  Surgeon: Scherry Ran, MD;  Location: [facility];  Service: General;  Laterality: Left;  . Portacath placement N/A 12/07/2012    Procedure: INSERTION PORT-A-CATH;  Surgeon: Scherry Ran, MD;  Location: [facility];  Service: General;  Laterality: N/A;  Attempted portacath placement on left and right side  . Cataract extraction w/phaco Left 02/13/2014    Procedure: CATARACT EXTRACTION PHACO AND INTRAOCULAR LENS PLACEMENT (IOC);  Surgeon: Tonny Branch, MD;  Location: [facility];  Service: Ophthalmology;  Laterality: Left;  CDE:  7.67    Denies any headaches, dizziness, double vision, fevers, chills, night sweats, nausea, vomiting, diarrhea, constipation, chest pain, heart palpitations, shortness of breath, blood in stool, black tarry stool, urinary pain, urinary burning, urinary frequency, hematuria.   PHYSICAL EXAMINATION  ECOG PERFORMANCE STATUS: 1 - Symptomatic but completely ambulatory  There were no vitals filed for this visit.  GENERAL:alert, no distress, well nourished, well developed, comfortable, cooperative, obese and smiling SKIN: skin color, texture, turgor are normal, no rashes or significant lesions HEAD: Normocephalic, No masses, lesions, tenderness or abnormalities EYES: normal, PERRLA, EOMI, Conjunctiva are pink and non-injected EARS: External ears normal OROPHARYNX:mucous membranes are moist  NECK: supple, trachea midline LYMPH:  no palpable lymphadenopathy BREAST:not examined LUNGS: clear to auscultation  HEART: regular rate & rhythm, no murmurs and no gallops ABDOMEN:abdomen soft, non-tender, obese and normal bowel sounds BACK: Back symmetric, no curvature. EXTREMITIES:less then 2 second capillary refill, no joint deformities,  effusion, or inflammation, no skin discoloration, no clubbing, no cyanosis  NEURO: alert & oriented x 3 with fluent speech, no focal motor/sensory deficits, gait normal    LABORATORY DATA: CBC    Component Value Date/Time   WBC 8.6 02/20/2014 1327   RBC 3.49* 02/20/2014 1327   HGB 10.5* 02/20/2014 1327   HCT 31.1* 02/20/2014 1327   PLT 162 02/20/2014 1327   MCV 89.1 02/20/2014 1327   MCH 30.1 02/20/2014 1327   MCHC 33.8 02/20/2014 1327   RDW 14.5 02/20/2014 1327   LYMPHSABS 2.6 02/20/2014 1327   MONOABS 1.7* 02/20/2014 1327   EOSABS 0.1 02/20/2014 1327   BASOSABS 0.0 02/20/2014 1327      Chemistry      Component Value Date/Time   NA 141 02/20/2014 1327   K 3.9 02/20/2014 1327   CL 111 02/20/2014 1327   CO2 15* 02/20/2014 1327   BUN 52* 02/20/2014 1327   CREATININE 2.42* 02/20/2014 1327   CREATININE 2.24* 10/05/2012 1411      Component Value Date/Time   CALCIUM 8.2* 02/20/2014 1327   CALCIUM 6.8* 08/15/2013 1217   ALKPHOS 65 02/20/2014 1327   AST 22 02/20/2014 1327   ALT 48 02/20/2014  1327   BILITOT 0.5 02/20/2014 1327     Results for [name], [name] (MRN [id number]) as of 02/07/2014 08:16  Ref. Range 11/10/2013 13:10  Albumin ELP Latest Range: 55.8-66.1 % 61.7  Alpha-1-Globulin Latest Range: 2.9-4.9 % 6.5 (H)  Alpha-2-Globulin Latest Range: 7.1-11.8 % 12.0 (H)  Beta Globulin Latest Range: 4.7-7.2 % 7.3 (H)  Beta 2 Latest Range: 3.2-6.5 % 4.0  Gamma Globulin Latest Range: 11.1-18.8 % 8.5 (L)  M-SPIKE, % No range found NOT DETECTED  SPE Interp. No range found (NOTE)  Comment No range found (NOTE)  IgG (Immunoglobin G), Serum Latest Range: [phone] mg/dL 447 (L)  IgA Latest Range: 68-379 mg/dL 67 (L)  IgM, Serum Latest Range: 41-251 mg/dL 50 (L)   Results for [name], [name] (MRN [id number]) as of 02/07/2014 08:16  Ref. Range 11/10/2013 13:10  IgG (Immunoglobin G), Serum Latest Range: [phone] mg/dL 447 (L)  IgA Latest Range: 68-379 mg/dL 67 (L)  Alpha-1-Globulin Latest Range: 2.9-4.9  % 6.5 (H)  Alpha-2-Globulin Latest Range: 7.1-11.8 % 12.0 (H)      ASSESSMENT:  1. Kappa light chain multiple myeloma, stage II disease presently on maintenance [name] 5 mg daily 7 days on, 7 days off along with weekly dexamethasone on Friday of every week. Zometa every 4 weeks.  2. Gastric ulcer, likely cause of epigastric abdominal pain, followed by GI.  3. Recent orchitis bilaterally status post multiple antibiotics, finally improved with IVIG  4. Diabetes mellitus  5. High-volume diarrhea followed by Dr. Laural Golden, suspected to be secondary to pancreatic steatorrhea.  6. Recurrent orchitis, S/P Cipro and IVIG on 11/10- 04/13/2013. Resolved  7. Hypogammaglobulinemia, secondary to multiple myeloma.   Patient Active Problem List   Diagnosis Date Noted  . Chronic systolic heart failure 41/32/4401  . Anemia, normocytic normochromic 10/09/2012  . Chronic pancreatitis 10/07/2012  . Atrial flutter 09/17/2012  . DDD (degenerative disc disease), cervical 03/18/2012  . Arteriosclerotic cardiovascular disease (ASCVD)   . Hypertension   . Hyperlipidemia   . Multiple myeloma 07/01/2011  . Morbid obesity 04/29/2010  . DIABETES MELLITUS, TYPE II, ON INSULIN 11/15/2008  . SLEEP APNEA 11/15/2008    PLAN:  1. I personally reviewed and went over laboratory results with the patient.  The results are noted within this dictation. 2. Continue Lenalidomide 5 mg, 7 days on and 7 days off with 20 mg Dexamethasone BID every Friday only.  3. Labs every 6 weeks: CBC diff, CMET, CRP, B2M, LDH, MM panel  4. Will change Zometa to every 6 weeks. 5. Return in 12 weeks for follow-up    THERAPY PLAN:  From a multiple myeloma standpoint, his disease is stable and we will focus on symptom management and depth of remission.    All questions were answered. The patient knows to call the clinic with any problems, questions or concerns. We can certainly see the patient much sooner if necessary.  Patient and plan  discussed with Dr. Farrel Gobble and he is in agreement with the aforementioned.   [place] 02/20/2014

## 2014-02-20 ENCOUNTER — Encounter (HOSPITAL_BASED_OUTPATIENT_CLINIC_OR_DEPARTMENT_OTHER): Payer: Managed Care, Other (non HMO)

## 2014-02-20 ENCOUNTER — Encounter (HOSPITAL_COMMUNITY): Payer: Managed Care, Other (non HMO) | Attending: Oncology | Admitting: Oncology

## 2014-02-20 VITALS — BP 93/51 | HR 43 | Temp 98.7°F | Resp 18

## 2014-02-20 DIAGNOSIS — C9000 Multiple myeloma not having achieved remission: Secondary | ICD-10-CM

## 2014-02-20 DIAGNOSIS — Z23 Encounter for immunization: Secondary | ICD-10-CM

## 2014-02-20 DIAGNOSIS — Z9889 Other specified postprocedural states: Secondary | ICD-10-CM | POA: Diagnosis present

## 2014-02-20 LAB — COMPREHENSIVE METABOLIC PANEL
ALT: 48 U/L (ref 0–53)
AST: 22 U/L (ref 0–37)
Albumin: 3.5 g/dL (ref 3.5–5.2)
Alkaline Phosphatase: 65 U/L (ref 39–117)
Anion gap: 15 (ref 5–15)
BUN: 52 mg/dL — ABNORMAL HIGH (ref 6–23)
CO2: 15 mEq/L — ABNORMAL LOW (ref 19–32)
Calcium: 8.2 mg/dL — ABNORMAL LOW (ref 8.4–10.5)
Chloride: 111 mEq/L (ref 96–112)
Creatinine, Ser: 2.42 mg/dL — ABNORMAL HIGH (ref 0.50–1.35)
GFR calc Af Amer: 33 mL/min — ABNORMAL LOW (ref >90)
GFR calc non Af Amer: 28 mL/min — ABNORMAL LOW (ref >90)
Glucose, Bld: 183 mg/dL — ABNORMAL HIGH (ref 70–99)
Potassium: 3.9 mEq/L (ref 3.7–5.3)
Sodium: 141 mEq/L (ref 137–147)
Total Bilirubin: 0.5 mg/dL (ref 0.3–1.2)
Total Protein: 6 g/dL (ref 6.0–8.3)

## 2014-02-20 LAB — CBC WITH DIFFERENTIAL/PLATELET
Basophils Absolute: 0 10*3/uL (ref 0.0–0.1)
Basophils Relative: 0 % (ref 0–1)
Eosinophils Absolute: 0.1 10*3/uL (ref 0.0–0.7)
Eosinophils Relative: 1 % (ref 0–5)
HCT: 31.1 % — ABNORMAL LOW (ref 39.0–52.0)
Hemoglobin: 10.5 g/dL — ABNORMAL LOW (ref 13.0–17.0)
Lymphocytes Relative: 30 % (ref 12–46)
Lymphs Abs: 2.6 10*3/uL (ref 0.7–4.0)
MCH: 30.1 pg (ref 26.0–34.0)
MCHC: 33.8 g/dL (ref 30.0–36.0)
MCV: 89.1 fL (ref 78.0–100.0)
Monocytes Absolute: 1.7 10*3/uL — ABNORMAL HIGH (ref 0.1–1.0)
Monocytes Relative: 20 % — ABNORMAL HIGH (ref 3–12)
Neutro Abs: 4.2 10*3/uL (ref 1.7–7.7)
Neutrophils Relative %: 49 % (ref 43–77)
Platelets: 162 10*3/uL (ref 150–400)
RBC: 3.49 MIL/uL — ABNORMAL LOW (ref 4.22–5.81)
RDW: 14.5 % (ref 11.5–15.5)
WBC: 8.6 10*3/uL (ref 4.0–10.5)

## 2014-02-20 LAB — LACTATE DEHYDROGENASE: LDH: 234 U/L (ref 94–250)

## 2014-02-20 LAB — C-REACTIVE PROTEIN: CRP: <0.5 mg/dL — ABNORMAL LOW (ref <0.60)

## 2014-02-20 MED ORDER — SODIUM CHLORIDE 0.9 % IJ SOLN
10.0000 mL | INTRAMUSCULAR | Status: DC | PRN
  Administered 2014-02-20: 10 mL via INTRAVENOUS

## 2014-02-20 MED ORDER — HEPARIN SOD (PORK) LOCK FLUSH 100 UNIT/ML IV SOLN
500.0000 [IU] | Freq: Once | INTRAVENOUS | Status: AC
  Administered 2014-02-20: 500 [IU] via INTRAVENOUS

## 2014-02-20 MED ORDER — INFLUENZA VAC SPLIT QUAD 0.5 ML IM SUSY
0.5000 mL | PREFILLED_SYRINGE | Freq: Once | INTRAMUSCULAR | Status: AC
  Administered 2014-02-20: 0.5 mL via INTRAMUSCULAR
  Filled 2014-02-20: qty 0.5

## 2014-02-20 MED ORDER — HEPARIN SOD (PORK) LOCK FLUSH 100 UNIT/ML IV SOLN
INTRAVENOUS | Status: AC
  Filled 2014-02-20: qty 5

## 2014-02-20 MED ORDER — ZOLEDRONIC ACID 4 MG/5ML IV CONC
2.0000 mg | Freq: Once | INTRAVENOUS | Status: AC
  Administered 2014-02-20: 2 mg via INTRAVENOUS
  Filled 2014-02-20: qty 2.5

## 2014-02-20 MED ORDER — SODIUM CHLORIDE 0.9 % IV SOLN
INTRAVENOUS | Status: DC
  Administered 2014-02-20: 15:00:00 via INTRAVENOUS

## 2014-02-20 NOTE — Patient Instructions (Signed)
[facility] Discharge Instructions  RECOMMENDATIONS MADE BY THE CONSULTANT AND ANY TEST RESULTS WILL BE SENT TO YOUR REFERRING PHYSICIAN.  EXAM FINDINGS BY THE PHYSICIAN TODAY AND SIGNS OR SYMPTOMS TO REPORT TO CLINIC OR PRIMARY PHYSICIAN: Exam and findings as discussed by Meriel Flavors.  MEDICATIONS PRESCRIBED:  Zometa infusion today. Continue [name] and Dexamethasone as prescribed.  INSTRUCTIONS/FOLLOW-UP: We will change labs and Zometa infusion to every 6 weeks. MD appointment again in 12 weeks. Return as scheduled.   Thank you for choosing [facility] to provide your oncology and hematology care.  To afford each patient quality time with our providers, please arrive at least 15 minutes before your scheduled appointment time.  With your help, our goal is to use those 15 minutes to complete the necessary work-up to ensure our physicians have the information they need to help with your evaluation and healthcare recommendations.    Effective January 1st, 2014, we ask that you re-schedule your appointment with our physicians should you arrive 10 or more minutes late for your appointment.  We strive to give you quality time with our providers, and arriving late affects you and other patients whose appointments are after yours.    Again, thank you for choosing [facility].  Our hope is that these requests will decrease the amount of time that you wait before being seen by our physicians.       _____________________________________________________________  Should you have questions after your visit to [facility], please contact our office at [phone] between the hours of 8:30 a.m. and 4:30 p.m.  Voicemails left after 4:30 p.m. will not be returned until the following business day.  For prescription refill requests, have your pharmacy contact our office with your prescription refill request.     _______________________________________________________________  We hope that we have given you very good care.  You may receive a patient satisfaction survey in the mail, please complete it and return it as soon as possible.  We value your feedback!  _______________________________________________________________  Have you asked about our STAR program?  STAR stands for Survivorship Training and Rehabilitation, and this is a nationally recognized cancer care program that focuses on survivorship and rehabilitation.  Cancer and cancer treatments may cause problems, such as, pain, making you feel tired and keeping you from doing the things that you need or want to do. Cancer rehabilitation can help. Our goal is to reduce these troubling effects and help you have the best quality of life possible.  You may receive a survey from a nurse that asks questions about your current state of health.  Based on the survey results, all eligible patients will be referred to the [facility] program for an evaluation so we can better serve you!  A frequently asked questions sheet is available upon request.

## 2014-02-21 LAB — KAPPA/LAMBDA LIGHT CHAINS
Kappa free light chain: 2.86 mg/dL — ABNORMAL HIGH (ref 0.33–1.94)
Kappa, lambda light chain ratio: 1.19 (ref 0.26–1.65)
Lambda free light chains: 2.41 mg/dL (ref 0.57–2.63)

## 2014-02-22 ENCOUNTER — Encounter (HOSPITAL_COMMUNITY): Payer: Self-pay | Admitting: Pharmacy Technician

## 2014-02-22 LAB — MULTIPLE MYELOMA PANEL, SERUM
Albumin ELP: 62.1 % (ref 55.8–66.1)
Alpha-1-Globulin: 5.7 % — ABNORMAL HIGH (ref 2.9–4.9)
Alpha-2-Globulin: 12.7 % — ABNORMAL HIGH (ref 7.1–11.8)
Beta 2: 4.4 % (ref 3.2–6.5)
Beta Globulin: 7.2 % (ref 4.7–7.2)
Gamma Globulin: 7.9 % — ABNORMAL LOW (ref 11.1–18.8)
IgA: 73 mg/dL (ref 68–379)
IgG (Immunoglobin G), Serum: 449 mg/dL — ABNORMAL LOW (ref 650–1600)
IgM, Serum: 66 mg/dL (ref 41–251)
M-Spike, %: NOT DETECTED g/dL
Total Protein: 5.8 g/dL — ABNORMAL LOW (ref 6.0–8.3)

## 2014-02-22 LAB — BETA 2 MICROGLOBULIN, SERUM: Beta-2 Microglobulin: 6.58 mg/L — ABNORMAL HIGH (ref <=2.51)

## 2014-02-23 ENCOUNTER — Ambulatory Visit (HOSPITAL_COMMUNITY): Payer: Managed Care, Other (non HMO)

## 2014-02-24 ENCOUNTER — Encounter (HOSPITAL_COMMUNITY)
Admission: RE | Admit: 2014-02-24 | Discharge: 2014-02-24 | Disposition: A | Discharge status: Home / Self Care | Payer: Managed Care, Other (non HMO) | Source: Ambulatory Visit | Attending: Ophthalmology | Admitting: Ophthalmology

## 2014-02-24 MED ORDER — FENTANYL CITRATE 0.05 MG/ML IJ SOLN: 25.0000 ug | INTRAMUSCULAR | Status: DC | PRN

## 2014-02-24 MED ORDER — ONDANSETRON HCL 4 MG/2ML IJ SOLN: 4.0000 mg | Freq: Once | INTRAMUSCULAR | Status: AC | PRN

## 2014-02-27 ENCOUNTER — Ambulatory Visit (HOSPITAL_COMMUNITY): Payer: Managed Care, Other (non HMO)

## 2014-02-28 ENCOUNTER — Ambulatory Visit (HOSPITAL_COMMUNITY): Payer: Managed Care, Other (non HMO) | Admitting: Oncology

## 2014-02-28 ENCOUNTER — Encounter (INDEPENDENT_AMBULATORY_CARE_PROVIDER_SITE_OTHER): Payer: Self-pay | Admitting: Internal Medicine

## 2014-02-28 ENCOUNTER — Ambulatory Visit (INDEPENDENT_AMBULATORY_CARE_PROVIDER_SITE_OTHER): Payer: Managed Care, Other (non HMO) | Admitting: Internal Medicine

## 2014-02-28 VITALS — BP 114/70 | HR 74 | Temp 97.5°F | Resp 18 | Ht 75.0 in | Wt 320.9 lb

## 2014-02-28 DIAGNOSIS — R197 Diarrhea, unspecified: Secondary | ICD-10-CM

## 2014-02-28 DIAGNOSIS — K903 Pancreatic steatorrhea: Secondary | ICD-10-CM

## 2014-02-28 MED ORDER — METRONIDAZOLE 250 MG PO TABS: 250.0000 mg | ORAL_TABLET | Freq: Three times a day (TID) | ORAL | Status: DC

## 2014-02-28 NOTE — Patient Instructions (Signed)
Keep stool diary while you're taking metronidazole. Progress report in 2 weeks. Take half of potassium dose on days when you do not take torsemide

## 2014-02-28 NOTE — Progress Notes (Signed)
Presenting complaint;  Diarrhea.  Subjective:  Patient is a 53 year old African American male with multiple medical problems also has diarrhea felt to be due to pancreatic insufficiency. He had quantitative fecal fat analysis and July 2013 and had over 90 g of fat in stool per 24 hours. He has been on pancreatic enzyme supplement. He states diarrhea is gradually getting worse and medication is not helping. He is having at least 3-4 bowel movements per day. Stools are loose and watery. Some of his bowel movements are explosive. Stool is very foul smelling. He says his appetite is normal. His weight is down by 3 pounds in the last 6 months. He still has bloating and upper abdominal pain but not as often and as bad as it used to be. He denies melena or rectal bleeding. He is on Decadron 40 mg every Friday and [name] daily for 7 days every other week. He does not feel that he gets more diarrhea when he is under limits. He has seen Dr.Befakadu for elevated creatinine.    Current Medications: Outpatient Encounter Prescriptions as of 02/28/2014  Medication Sig  . acyclovir (ZOVIRAX) 400 MG tablet Take 1 tablet (400 mg total) by mouth daily.  Marland Kitchen allopurinol (ZYLOPRIM) 300 MG tablet Take 1 tablet (300 mg total) by mouth daily.  Marland Kitchen atorvastatin (LIPITOR) 80 MG tablet TAKE ONE TABLET BY MOUTH AT BEDTIME.  . calcium-vitamin D (OSCAL WITH D) 500-200 MG-UNIT per tablet Take 1 tablet by mouth 2 (two) times daily.   Marland Kitchen dexamethasone (DECADRON) 4 MG tablet TAKE 5 TABLETS BY MOUTH TWICE DAILY EVERY FRIDAY.  Marland Kitchen dexlansoprazole (DEXILANT) 60 MG capsule Take 60 mg by mouth every morning.   . diphenoxylate-atropine (LOMOTIL) 2.5-0.025 MG per tablet TAKE 2 TABLETS BY MOUTH EVERY MORNING.MAX 8 A DAY.  . ergocalciferol (VITAMIN D2) 50000 UNITS capsule 50,000 units weekly x 4 weeks and then 50,000 units monthly  . HYDROcodone-acetaminophen (NORCO/VICODIN) 5-325 MG per tablet Take 1-2 tablets by mouth every 4 (four) hours  as needed.  . insulin detemir (LEVEMIR) 100 UNIT/ML injection Inject 40 Units into the skin every morning.   . insulin lispro (HUMALOG) 100 UNIT/ML injection Inject 5-20 Units into the skin 2 (two) times daily with a meal. Sliding Scale per patient  . lenalidomide ([name]) 5 MG capsule Take 1 tablet PO 7 days on and 7 days off.  . lidocaine-prilocaine (EMLA) cream Apply 1 application topically as needed (for port-a-cath access).   Marland Kitchen lisinopril (PRINIVIL,ZESTRIL) 5 MG tablet Take 5 mg by mouth every other day.  . loratadine (CLARITIN) 10 MG tablet Take 10 mg by mouth every morning.   . metoprolol succinate (TOPROL XL) 25 MG 24 hr tablet Take 1 tablet (25 mg total) by mouth daily.  . Multiple Vitamins-Minerals (MULTIVITAMINS THER. W/MINERALS) TABS Take 1 tablet by mouth every morning.   . nitroGLYCERIN (NITROSTAT) 0.4 MG SL tablet Place 1 tablet (0.4 mg total) under the tongue every 5 (five) minutes as needed for chest pain.  . Pancrelipase, Lip-Prot-Amyl, (CREON) 36000 UNITS CPEP TAKE 4 CAPSULES BY MOUTH DAILY WITH MEALS AND 2 CAPSULES WITH SNACKS.  Marland Kitchen potassium chloride SA (K-DUR,KLOR-CON) 20 MEQ tablet TAKE 2 TABLETS BY MOUTH TWICE DAILY.  . rivaroxaban (XARELTO) 20 MG TABS tablet TAKE 1 TABLET BY MOUTH DAILY.  Marland Kitchen sulfamethoxazole-trimethoprim (BACTRIM DS) 800-160 MG per tablet Take 1 tablet by mouth every Monday, Wednesday, and Friday.  . torsemide (DEMADEX) 20 MG tablet Take 20 mg by mouth daily as needed (for fluid  retention).   . Zoledronic Acid (ZOMETA IV) Inject 2 mg into the vein every 28 (twenty-eight) days.   . calcitRIOL (ROCALTROL) 0.25 MCG capsule Take 0.25 mcg by mouth daily.  . [DISCONTINUED] ondansetron (ZOFRAN) 8 MG tablet Take 1 tablet (8 mg total) by mouth every 4 (four) hours as needed for nausea.     Objective: Blood pressure 114/70, pulse 74, temperature 97.5 F (36.4 C), temperature source Oral, resp. rate 18, height 6\' 3"  (1.905 m), weight 320 lb 14.4 oz (145.559  kg). Patient is alert and in no acute distress.  Conjunctiva is pink. Sclera is nonicteric Oropharyngeal mucosa is normal. No neck masses or thyromegaly noted. Cardiac exam with regular rhythm normal S1 and S2. No murmur or gallop noted. Lungs are clear to auscultation. Abdomen is full. Bowel sounds are normal. On palpation abdomen is soft. He has mild tenderness in epigastrium. No organomegaly or masses.  No LE edema or clubbing noted.  Labs/studies Results: Lab data from 02/20/2014 reviewed. WBC 8.6, H&H 10.5 and 31.1 and platelet count 162K  Sodium 141, potassium 3.9, chloride 111, CO2 15, BUN 52 creatinine 2.42. Glucose 183 . Bilirubin 0.5, AP 65, AST 22, ALT 48 and albumin 3.5.  Serum calcium 8.2   Assessment:  #1. Chronic diarrhea with increasing frequency over the last few months. He has steatorrhea felt to be secondary to pancreatic insufficiency. Pancreatic enzyme supplement is not working anymore. He may also have small intestinal bacterial overgrowth. If he does not respond to antibiotic will consider stool studies and flexible sigmoidoscopy with biopsy. #2. History of elevated transaminases.Transaminases are not normal.  #3.CKD. Creatinine is gradually climbing. Patient encouraged to keep his appointment with Dr. Hinda Lenis. .   Plan:  Metronidazole 250 mg by mouth three times a day for two weeks. Patient will call with progress report at end of therapy. If patient does not feel better we'll do GI pathogen panel prior to endoscopic evaluation.

## 2014-03-01 ENCOUNTER — Encounter (INDEPENDENT_AMBULATORY_CARE_PROVIDER_SITE_OTHER): Payer: Self-pay | Admitting: *Deleted

## 2014-03-01 MED ORDER — PHENYLEPHRINE HCL 2.5 % OP SOLN
OPHTHALMIC | Status: AC
  Filled 2014-03-01: qty 15

## 2014-03-01 MED ORDER — LIDOCAINE HCL (PF) 1 % IJ SOLN
INTRAMUSCULAR | Status: AC
  Filled 2014-03-01: qty 2

## 2014-03-01 MED ORDER — CYCLOPENTOLATE-PHENYLEPHRINE OP SOLN OPTIME - NO CHARGE
OPHTHALMIC | Status: AC
  Filled 2014-03-01: qty 2

## 2014-03-01 MED ORDER — TETRACAINE HCL 0.5 % OP SOLN
OPHTHALMIC | Status: AC
  Filled 2014-03-01: qty 2

## 2014-03-01 MED ORDER — NEOMYCIN-POLYMYXIN-DEXAMETH 3.5-10000-0.1 OP SUSP
OPHTHALMIC | Status: AC
  Filled 2014-03-01: qty 5

## 2014-03-01 MED ORDER — LIDOCAINE HCL 3.5 % OP GEL
OPHTHALMIC | Status: AC
  Filled 2014-03-01: qty 1

## 2014-03-02 ENCOUNTER — Encounter (HOSPITAL_COMMUNITY)
Admission: RE | Disposition: A | Discharge status: Home / Self Care | Payer: Self-pay | Source: Ambulatory Visit | Attending: Ophthalmology

## 2014-03-02 ENCOUNTER — Telehealth (INDEPENDENT_AMBULATORY_CARE_PROVIDER_SITE_OTHER): Payer: Self-pay | Admitting: *Deleted

## 2014-03-02 ENCOUNTER — Ambulatory Visit (HOSPITAL_COMMUNITY)
Admission: RE | Admit: 2014-03-02 | Discharge: 2014-03-02 | Disposition: A | Discharge status: Home / Self Care | Payer: Managed Care, Other (non HMO) | Source: Ambulatory Visit | Attending: Ophthalmology | Admitting: Ophthalmology

## 2014-03-02 ENCOUNTER — Encounter (HOSPITAL_COMMUNITY): Payer: Managed Care, Other (non HMO) | Admitting: Anesthesiology

## 2014-03-02 ENCOUNTER — Ambulatory Visit (HOSPITAL_COMMUNITY): Payer: Managed Care, Other (non HMO) | Admitting: Anesthesiology

## 2014-03-02 ENCOUNTER — Other Ambulatory Visit (INDEPENDENT_AMBULATORY_CARE_PROVIDER_SITE_OTHER): Payer: Self-pay | Admitting: *Deleted

## 2014-03-02 ENCOUNTER — Encounter (HOSPITAL_COMMUNITY): Payer: Self-pay | Admitting: *Deleted

## 2014-03-02 DIAGNOSIS — E119 Type 2 diabetes mellitus without complications: Secondary | ICD-10-CM | POA: Insufficient documentation

## 2014-03-02 DIAGNOSIS — Z794 Long term (current) use of insulin: Secondary | ICD-10-CM | POA: Diagnosis not present

## 2014-03-02 DIAGNOSIS — Z87891 Personal history of nicotine dependence: Secondary | ICD-10-CM | POA: Diagnosis not present

## 2014-03-02 DIAGNOSIS — H25811 Combined forms of age-related cataract, right eye: Secondary | ICD-10-CM | POA: Diagnosis present

## 2014-03-02 DIAGNOSIS — I10 Essential (primary) hypertension: Secondary | ICD-10-CM | POA: Insufficient documentation

## 2014-03-02 HISTORY — PX: CATARACT EXTRACTION W/PHACO: SHX586

## 2014-03-02 LAB — GLUCOSE, CAPILLARY: Glucose-Capillary: 132 mg/dL — ABNORMAL HIGH (ref 70–99)

## 2014-03-02 SURGERY — PHACOEMULSIFICATION, CATARACT, WITH IOL INSERTION
Anesthesia: Monitor Anesthesia Care | Site: Eye | Laterality: Right

## 2014-03-02 MED ORDER — MIDAZOLAM HCL 2 MG/2ML IJ SOLN
INTRAMUSCULAR | Status: AC
  Filled 2014-03-02: qty 2

## 2014-03-02 MED ORDER — LIDOCAINE HCL (PF) 1 % IJ SOLN
INTRAMUSCULAR | Status: DC | PRN
  Administered 2014-03-02: .6 mL

## 2014-03-02 MED ORDER — FENTANYL CITRATE 0.05 MG/ML IJ SOLN
25.0000 ug | INTRAMUSCULAR | Status: AC
  Administered 2014-03-02 (×2): 25 ug via INTRAVENOUS

## 2014-03-02 MED ORDER — PROVISC 10 MG/ML IO SOLN
INTRAOCULAR | Status: DC | PRN
  Administered 2014-03-02: 0.85 mL via INTRAOCULAR

## 2014-03-02 MED ORDER — DIPHENOXYLATE-ATROPINE 2.5-0.025 MG PO TABS: 2.0000 | ORAL_TABLET | Freq: Two times a day (BID) | ORAL | Status: DC

## 2014-03-02 MED ORDER — PHENYLEPHRINE HCL 2.5 % OP SOLN
1.0000 [drp] | OPHTHALMIC | Status: AC
  Administered 2014-03-02 (×3): 1 [drp] via OPHTHALMIC

## 2014-03-02 MED ORDER — LACTATED RINGERS IV SOLN
INTRAVENOUS | Status: DC
  Administered 2014-03-02: 1000 mL via INTRAVENOUS

## 2014-03-02 MED ORDER — EPINEPHRINE HCL 1 MG/ML IJ SOLN
INTRAMUSCULAR | Status: DC | PRN
  Administered 2014-03-02: 09:00:00

## 2014-03-02 MED ORDER — TETRACAINE HCL 0.5 % OP SOLN
1.0000 [drp] | OPHTHALMIC | Status: AC
  Administered 2014-03-02 (×3): 1 [drp] via OPHTHALMIC

## 2014-03-02 MED ORDER — CYCLOPENTOLATE-PHENYLEPHRINE 0.2-1 % OP SOLN
1.0000 [drp] | OPHTHALMIC | Status: AC
  Administered 2014-03-02 (×3): 1 [drp] via OPHTHALMIC

## 2014-03-02 MED ORDER — LIDOCAINE HCL 3.5 % OP GEL
1.0000 "application " | Freq: Once | OPHTHALMIC | Status: AC
  Administered 2014-03-02: 1 via OPHTHALMIC

## 2014-03-02 MED ORDER — NEOMYCIN-POLYMYXIN-DEXAMETH 3.5-10000-0.1 OP SUSP
OPHTHALMIC | Status: DC | PRN
  Administered 2014-03-02: 2 [drp] via OPHTHALMIC

## 2014-03-02 MED ORDER — FENTANYL CITRATE 0.05 MG/ML IJ SOLN
INTRAMUSCULAR | Status: AC
  Filled 2014-03-02: qty 2

## 2014-03-02 MED ORDER — POVIDONE-IODINE 5 % OP SOLN
OPHTHALMIC | Status: DC | PRN
  Administered 2014-03-02: 1 via OPHTHALMIC

## 2014-03-02 MED ORDER — EPINEPHRINE HCL 1 MG/ML IJ SOLN
INTRAMUSCULAR | Status: AC
  Filled 2014-03-02: qty 1

## 2014-03-02 MED ORDER — MIDAZOLAM HCL 2 MG/2ML IJ SOLN
1.0000 mg | INTRAMUSCULAR | Status: DC | PRN
  Administered 2014-03-02: 2 mg via INTRAVENOUS

## 2014-03-02 MED ORDER — BSS IO SOLN
INTRAOCULAR | Status: DC | PRN
  Administered 2014-03-02: 15 mL

## 2014-03-02 SURGICAL SUPPLY — 12 items
CLOTH BEACON ORANGE TIMEOUT ST (SAFETY) ×2 IMPLANT
EYE SHIELD UNIVERSAL CLEAR (GAUZE/BANDAGES/DRESSINGS) ×1 IMPLANT
GLOVE BIOGEL PI IND STRL 6.5 (GLOVE) IMPLANT
GLOVE BIOGEL PI IND STRL 7.0 (GLOVE) ×1 IMPLANT
GLOVE BIOGEL PI INDICATOR 6.5 (GLOVE) ×1
GLOVE BIOGEL PI INDICATOR 7.0 (GLOVE) ×1
PAD ARMBOARD 7.5X6 YLW CONV (MISCELLANEOUS) ×2 IMPLANT
SIGHTPATH CAT PROC W REG LENS (Ophthalmic Related) ×2 IMPLANT
SYRINGE LUER LOK 1CC (MISCELLANEOUS) ×2 IMPLANT
TAPE SURG TRANSPORE 1 IN (GAUZE/BANDAGES/DRESSINGS) ×1 IMPLANT
TAPE SURGICAL TRANSPORE 1 IN (GAUZE/BANDAGES/DRESSINGS) ×1
WATER STERILE IRR 250ML POUR (IV SOLUTION) ×2 IMPLANT

## 2014-03-02 NOTE — H&P (Signed)
I have reviewed the H&P, the patient was re-examined, and I have identified no interval changes in medical condition and plan of care since the history and physical of record  

## 2014-03-02 NOTE — Op Note (Signed)
Date of Admission: 03/02/2014  Date of Surgery: 03/02/2014   Pre-Op Dx: Cataract Right Eye  Post-Op Dx: Senile Combined  Cataract Right  Eye,  Dx Code [id number]  Surgeon: Tonny Branch, M.D.  Assistants: None  Anesthesia: Topical with MAC  Indications: Painless, progressive loss of vision with compromise of daily activities.  Surgery: Cataract Extraction with Intraocular lens Implant Right Eye  Discription: The patient had dilating drops and viscous lidocaine placed into the Right eye in the pre-op holding area. After transfer to the operating room, a time out was performed. The patient was then prepped and draped. Beginning with a 75 degree blade a paracentesis port was made at the surgeon's 2 o'clock position. The anterior chamber was then filled with 1% non-preserved lidocaine. This was followed by filling the anterior chamber with Provisc.  A 2.38mm keratome blade was used to make a clear corneal incision at the temporal limbus.  A bent cystatome needle was used to create a continuous tear capsulotomy. Hydrodissection was performed with balanced salt solution on a Fine canula. The lens nucleus was then removed using the phacoemulsification handpiece. Residual cortex was removed with the I&A handpiece. The anterior chamber and capsular bag were refilled with Provisc. A posterior chamber intraocular lens was placed into the capsular bag with it's injector. The implant was positioned with the Kuglan hook. The Provisc was then removed from the anterior chamber and capsular bag with the I&A handpiece. Stromal hydration of the main incision and paracentesis port was performed with BSS on a Fine canula. The wounds were tested for leak which was negative. The patient tolerated the procedure well. There were no operative complications. The patient was then transferred to the recovery room in stable condition.  Complications: None  Specimen: None  EBL: None  Prosthetic device: Hoya iSert 250, power 21.5  D, SN [id number].

## 2014-03-02 NOTE — Anesthesia Procedure Notes (Signed)
Procedure Name: MAC Date/Time: 03/02/2014 8:34 AM Performed by: Vista Deck Pre-anesthesia Checklist: Patient identified, Emergency Drugs available, Suction available, Timeout performed and Patient being monitored Patient Re-evaluated:Patient Re-evaluated prior to inductionOxygen Delivery Method: Nasal Cannula

## 2014-03-02 NOTE — Anesthesia Preprocedure Evaluation (Signed)
Anesthesia Evaluation  Patient identified by MRN, date of birth, ID band Patient awake    Reviewed: Allergy & Precautions, H&P , NPO status , Patient's Chart, lab work & pertinent test results  Airway Mallampati: II TM Distance: >3 FB     Dental  (+) Teeth Intact, Caps   Pulmonary sleep apnea and Continuous Positive Airway Pressure Ventilation , former smoker,  breath sounds clear to auscultation        Cardiovascular hypertension, Pt. on medications + CAD, + Past MI and + Cardiac Stents Rhythm:Regular Rate:Normal     Neuro/Psych    GI/Hepatic GERD-  Medicated and Controlled,  Endo/Other  diabetes, Well Controlled, Type 2, Oral Hypoglycemic Agents  Renal/GU Renal InsufficiencyRenal disease     Musculoskeletal  (+) Arthritis -,   Abdominal   Peds  Hematology  (+) anemia ,   Anesthesia Other Findings   Reproductive/Obstetrics                           Anesthesia Physical Anesthesia Plan  ASA: III  Anesthesia Plan: MAC   Post-op Pain Management:    Induction: Intravenous  Airway Management Planned: Nasal Cannula  Additional Equipment:   Intra-op Plan:   Post-operative Plan:   Informed Consent: I have reviewed the patients History and Physical, chart, labs and discussed the procedure including the risks, benefits and alternatives for the proposed anesthesia with the patient or authorized representative who has indicated his/her understanding and acceptance.     Plan Discussed with:   Anesthesia Plan Comments:         Anesthesia Quick Evaluation

## 2014-03-02 NOTE — Telephone Encounter (Signed)
A refill request has been sent to Dr.Rehman. 

## 2014-03-02 NOTE — Anesthesia Postprocedure Evaluation (Signed)
  Anesthesia Post-op Note  Patient: [name]  Procedure(s) Performed: Procedure(s) (LRB): CATARACT EXTRACTION PHACO AND INTRAOCULAR LENS PLACEMENT RIGHT EYE CDE=16.81 (Right)  Patient Location:  Short Stay  Anesthesia Type: MAC  Level of Consciousness: awake  Airway and Oxygen Therapy: Patient Spontanous Breathing  Post-op Pain: none  Post-op Assessment: Post-op Vital signs reviewed, Patient's Cardiovascular Status Stable, Respiratory Function Stable, Patent Airway, No signs of Nausea or vomiting and Pain level controlled  Post-op Vital Signs: Reviewed and stable  Complications: No apparent anesthesia complications

## 2014-03-02 NOTE — Transfer of Care (Signed)
Immediate Anesthesia Transfer of Care Note  Patient: [name]  Procedure(s) Performed: Procedure(s) (LRB): CATARACT EXTRACTION PHACO AND INTRAOCULAR LENS PLACEMENT RIGHT EYE CDE=16.81 (Right)  Patient Location: Shortstay  Anesthesia Type: MAC  Level of Consciousness: awake  Airway & Oxygen Therapy: Patient Spontanous Breathing   Post-op Assessment: Report given to PACU RN, Post -op Vital signs reviewed and stable and Patient moving all extremities  Post vital signs: Reviewed and stable  Complications: No apparent anesthesia complications

## 2014-03-02 NOTE — Telephone Encounter (Signed)
Patient is in need of a refill , Lomotil .

## 2014-03-02 NOTE — Discharge Instructions (Signed)

## 2014-03-02 NOTE — Telephone Encounter (Signed)
Needs his Lomotil refilled. He told Dr. Laural Golden and he must have forgot.

## 2014-03-03 ENCOUNTER — Encounter (HOSPITAL_COMMUNITY): Payer: Self-pay | Admitting: Ophthalmology

## 2014-03-24 ENCOUNTER — Encounter (INDEPENDENT_AMBULATORY_CARE_PROVIDER_SITE_OTHER): Payer: Self-pay | Admitting: *Deleted

## 2014-03-29 ENCOUNTER — Other Ambulatory Visit (HOSPITAL_COMMUNITY): Payer: Self-pay | Admitting: Oncology

## 2014-03-29 DIAGNOSIS — C9000 Multiple myeloma not having achieved remission: Secondary | ICD-10-CM

## 2014-03-29 MED ORDER — LENALIDOMIDE 5 MG PO CAPS: ORAL_CAPSULE | ORAL | Status: DC

## 2014-04-03 ENCOUNTER — Ambulatory Visit (HOSPITAL_COMMUNITY): Payer: Managed Care, Other (non HMO)

## 2014-04-05 ENCOUNTER — Other Ambulatory Visit (HOSPITAL_COMMUNITY): Payer: Self-pay | Admitting: Oncology

## 2014-04-18 ENCOUNTER — Other Ambulatory Visit (HOSPITAL_COMMUNITY): Payer: Self-pay | Admitting: Oncology

## 2014-04-18 ENCOUNTER — Encounter (HOSPITAL_COMMUNITY): Payer: Self-pay

## 2014-04-18 ENCOUNTER — Encounter (HOSPITAL_COMMUNITY): Payer: Managed Care, Other (non HMO) | Attending: Oncology

## 2014-04-18 DIAGNOSIS — C9000 Multiple myeloma not having achieved remission: Secondary | ICD-10-CM

## 2014-04-18 LAB — COMPREHENSIVE METABOLIC PANEL
ALT: 63 U/L — ABNORMAL HIGH (ref 0–53)
AST: 42 U/L — ABNORMAL HIGH (ref 0–37)
Albumin: 3.3 g/dL — ABNORMAL LOW (ref 3.5–5.2)
Alkaline Phosphatase: 102 U/L (ref 39–117)
Anion gap: 14 (ref 5–15)
BUN: 42 mg/dL — ABNORMAL HIGH (ref 6–23)
CO2: 19 mEq/L (ref 19–32)
Calcium: 8.3 mg/dL — ABNORMAL LOW (ref 8.4–10.5)
Chloride: 106 mEq/L (ref 96–112)
Creatinine, Ser: 1.9 mg/dL — ABNORMAL HIGH (ref 0.50–1.35)
GFR calc Af Amer: 44 mL/min — ABNORMAL LOW (ref >90)
GFR calc non Af Amer: 38 mL/min — ABNORMAL LOW (ref >90)
Glucose, Bld: 200 mg/dL — ABNORMAL HIGH (ref 70–99)
Potassium: 3.9 mEq/L (ref 3.7–5.3)
Sodium: 139 mEq/L (ref 137–147)
Total Bilirubin: 0.5 mg/dL (ref 0.3–1.2)
Total Protein: 5.8 g/dL — ABNORMAL LOW (ref 6.0–8.3)

## 2014-04-18 LAB — CBC WITH DIFFERENTIAL/PLATELET
Basophils Absolute: 0 10*3/uL (ref 0.0–0.1)
Basophils Relative: 0 % (ref 0–1)
Eosinophils Absolute: 0.3 10*3/uL (ref 0.0–0.7)
Eosinophils Relative: 5 % (ref 0–5)
HCT: 29.4 % — ABNORMAL LOW (ref 39.0–52.0)
Hemoglobin: 9.8 g/dL — ABNORMAL LOW (ref 13.0–17.0)
Lymphocytes Relative: 37 % (ref 12–46)
Lymphs Abs: 2.3 10*3/uL (ref 0.7–4.0)
MCH: 31.2 pg (ref 26.0–34.0)
MCHC: 33.3 g/dL (ref 30.0–36.0)
MCV: 93.6 fL (ref 78.0–100.0)
Monocytes Absolute: 0.9 10*3/uL (ref 0.1–1.0)
Monocytes Relative: 14 % — ABNORMAL HIGH (ref 3–12)
Neutro Abs: 2.8 10*3/uL (ref 1.7–7.7)
Neutrophils Relative %: 44 % (ref 43–77)
Platelets: 149 10*3/uL — ABNORMAL LOW (ref 150–400)
RBC: 3.14 MIL/uL — ABNORMAL LOW (ref 4.22–5.81)
RDW: 15.5 % (ref 11.5–15.5)
WBC: 6.4 10*3/uL (ref 4.0–10.5)

## 2014-04-18 LAB — LACTATE DEHYDROGENASE: LDH: 238 U/L (ref 94–250)

## 2014-04-18 LAB — C-REACTIVE PROTEIN: CRP: <0.5 mg/dL — ABNORMAL LOW (ref <0.60)

## 2014-04-18 MED ORDER — SODIUM CHLORIDE 0.9 % IV SOLN
INTRAVENOUS | Status: DC
Start: 2014-04-18 — End: 2014-04-18
  Administered 2014-04-18: 15:00:00 via INTRAVENOUS

## 2014-04-18 MED ORDER — HEPARIN SOD (PORK) LOCK FLUSH 100 UNIT/ML IV SOLN
500.0000 [IU] | Freq: Once | INTRAVENOUS | Status: AC
  Administered 2014-04-18: 500 [IU] via INTRAVENOUS

## 2014-04-18 MED ORDER — ZOLEDRONIC ACID 4 MG/5ML IV CONC
2.0000 mg | Freq: Once | INTRAVENOUS | Status: AC
  Administered 2014-04-18: 2 mg via INTRAVENOUS
  Filled 2014-04-18: qty 2.5

## 2014-04-18 MED ORDER — LENALIDOMIDE 5 MG PO CAPS: ORAL_CAPSULE | ORAL | Status: DC

## 2014-04-18 MED ORDER — SODIUM CHLORIDE 0.9 % IJ SOLN
10.0000 mL | INTRAMUSCULAR | Status: DC | PRN
Start: 2014-04-18 — End: 2014-04-18

## 2014-04-18 NOTE — Patient Instructions (Signed)
[facility] Discharge Instructions  Today you received Zometa infusion. Return as scheduled for future infusions, office visits, and lab work. Please contact the clinic if you have any questions or concerns regarding your treatment here.  Thank you for choosing [facility] to provide your oncology and hematology care.  To afford each patient quality time with our providers, please arrive at least 15 minutes before your scheduled appointment time.  With your help, our goal is to use those 15 minutes to complete the necessary work-up to ensure our physicians have the information they need to help with your evaluation and healthcare recommendations.    Effective January 1st, 2014, we ask that you re-schedule your appointment with our physicians should you arrive 10 or more minutes late for your appointment.  We strive to give you quality time with our providers, and arriving late affects you and other patients whose appointments are after yours.    Again, thank you for choosing [facility].  Our hope is that these requests will decrease the amount of time that you wait before being seen by our physicians.       _____________________________________________________________  Should you have questions after your visit to [facility], please contact our office at [phone] between the hours of 8:30 a.m. and 4:30 p.m.  Voicemails left after 4:30 p.m. will not be returned until the following business day.  For prescription refill requests, have your pharmacy contact our office with your prescription refill request.    _______________________________________________________________  We hope that we have given you very good care.  You may receive a patient satisfaction survey in the mail, please complete it and return it as soon as possible.  We value your feedback!  _______________________________________________________________  Have you  asked about our STAR program?  STAR stands for Survivorship Training and Rehabilitation, and this is a nationally recognized cancer care program that focuses on survivorship and rehabilitation.  Cancer and cancer treatments may cause problems, such as, pain, making you feel tired and keeping you from doing the things that you need or want to do. Cancer rehabilitation can help. Our goal is to reduce these troubling effects and help you have the best quality of life possible.  You may receive a survey from a nurse that asks questions about your current state of health.  Based on the survey results, all eligible patients will be referred to the [facility] program for an evaluation so we can better serve you!  A frequently asked questions sheet is available upon request.

## 2014-04-20 LAB — KAPPA/LAMBDA LIGHT CHAINS
Kappa free light chain: 1.74 mg/dL (ref 0.33–1.94)
Kappa, lambda light chain ratio: 1.11 (ref 0.26–1.65)
Lambda free light chains: 1.57 mg/dL (ref 0.57–2.63)

## 2014-04-20 LAB — MULTIPLE MYELOMA PANEL, SERUM
Albumin ELP: 61.8 % (ref 55.8–66.1)
Alpha-1-Globulin: 5.6 % — ABNORMAL HIGH (ref 2.9–4.9)
Alpha-2-Globulin: 12.3 % — ABNORMAL HIGH (ref 7.1–11.8)
Beta 2: 4.9 % (ref 3.2–6.5)
Beta Globulin: 7.1 % (ref 4.7–7.2)
Gamma Globulin: 8.3 % — ABNORMAL LOW (ref 11.1–18.8)
IgA: 71 mg/dL (ref 68–379)
IgG (Immunoglobin G), Serum: 417 mg/dL — ABNORMAL LOW (ref 650–1600)
IgM, Serum: 54 mg/dL — ABNORMAL LOW (ref 41–251)
M-Spike, %: NOT DETECTED g/dL
Total Protein: 7.1 g/dL (ref 6.0–8.3)

## 2014-04-20 LAB — BETA 2 MICROGLOBULIN, SERUM: Beta-2 Microglobulin: 5.24 mg/L — ABNORMAL HIGH (ref <=2.51)

## 2014-04-26 ENCOUNTER — Telehealth (INDEPENDENT_AMBULATORY_CARE_PROVIDER_SITE_OTHER): Payer: Self-pay | Admitting: Internal Medicine

## 2014-04-26 ENCOUNTER — Other Ambulatory Visit (HOSPITAL_COMMUNITY): Payer: Self-pay | Admitting: Oncology

## 2014-04-26 DIAGNOSIS — E876 Hypokalemia: Secondary | ICD-10-CM

## 2014-04-26 MED ORDER — POTASSIUM CHLORIDE CRYS ER 20 MEQ PO TBCR: EXTENDED_RELEASE_TABLET | ORAL | Status: DC

## 2014-04-26 MED ORDER — TORSEMIDE 20 MG PO TABS: 20.0000 mg | ORAL_TABLET | Freq: Two times a day (BID) | ORAL | Status: DC

## 2014-04-26 MED ORDER — PANCRELIPASE (LIP-PROT-AMYL) 36000-114000 UNITS PO CPEP: ORAL_CAPSULE | ORAL | Status: DC

## 2014-04-26 NOTE — Telephone Encounter (Signed)
Rx sent 

## 2014-05-09 ENCOUNTER — Other Ambulatory Visit (HOSPITAL_COMMUNITY): Payer: Self-pay | Admitting: Oncology

## 2014-05-09 DIAGNOSIS — C9000 Multiple myeloma not having achieved remission: Secondary | ICD-10-CM

## 2014-05-09 MED ORDER — LENALIDOMIDE 5 MG PO CAPS: ORAL_CAPSULE | ORAL | Status: DC

## 2014-05-15 ENCOUNTER — Ambulatory Visit (HOSPITAL_COMMUNITY): Payer: Managed Care, Other (non HMO)

## 2014-05-19 ENCOUNTER — Encounter (HOSPITAL_COMMUNITY): Payer: Managed Care, Other (non HMO) | Attending: Oncology

## 2014-05-19 DIAGNOSIS — C9000 Multiple myeloma not having achieved remission: Secondary | ICD-10-CM | POA: Insufficient documentation

## 2014-05-19 LAB — CBC WITH DIFFERENTIAL/PLATELET
Basophils Absolute: 0 10*3/uL (ref 0.0–0.1)
Basophils Relative: 0 % (ref 0–1)
Eosinophils Absolute: 0.1 10*3/uL (ref 0.0–0.7)
Eosinophils Relative: 2 % (ref 0–5)
HCT: 29.9 % — ABNORMAL LOW (ref 39.0–52.0)
Hemoglobin: 9.9 g/dL — ABNORMAL LOW (ref 13.0–17.0)
Lymphocytes Relative: 15 % (ref 12–46)
Lymphs Abs: 0.8 10*3/uL (ref 0.7–4.0)
MCH: 31.5 pg (ref 26.0–34.0)
MCHC: 33.1 g/dL (ref 30.0–36.0)
MCV: 95.2 fL (ref 78.0–100.0)
Monocytes Absolute: 0.2 10*3/uL (ref 0.1–1.0)
Monocytes Relative: 4 % (ref 3–12)
Neutro Abs: 4.4 10*3/uL (ref 1.7–7.7)
Neutrophils Relative %: 79 % — ABNORMAL HIGH (ref 43–77)
Platelets: 140 10*3/uL — ABNORMAL LOW (ref 150–400)
RBC: 3.14 MIL/uL — ABNORMAL LOW (ref 4.22–5.81)
RDW: 15.2 % (ref 11.5–15.5)
WBC: 5.5 10*3/uL (ref 4.0–10.5)

## 2014-05-19 LAB — COMPREHENSIVE METABOLIC PANEL
ALT: 163 U/L — ABNORMAL HIGH (ref 0–53)
AST: 162 U/L — ABNORMAL HIGH (ref 0–37)
Albumin: 3.2 g/dL — ABNORMAL LOW (ref 3.5–5.2)
Alkaline Phosphatase: 120 U/L — ABNORMAL HIGH (ref 39–117)
Anion gap: 14 (ref 5–15)
BUN: 32 mg/dL — ABNORMAL HIGH (ref 6–23)
CO2: 18 mEq/L — ABNORMAL LOW (ref 19–32)
Calcium: 8.9 mg/dL (ref 8.4–10.5)
Chloride: 106 mEq/L (ref 96–112)
Creatinine, Ser: 1.76 mg/dL — ABNORMAL HIGH (ref 0.50–1.35)
GFR calc Af Amer: 48 mL/min — ABNORMAL LOW (ref >90)
GFR calc non Af Amer: 42 mL/min — ABNORMAL LOW (ref >90)
Glucose, Bld: 193 mg/dL — ABNORMAL HIGH (ref 70–99)
Potassium: 4.6 mEq/L (ref 3.7–5.3)
Sodium: 138 mEq/L (ref 137–147)
Total Bilirubin: 0.4 mg/dL (ref 0.3–1.2)
Total Protein: 6 g/dL (ref 6.0–8.3)

## 2014-05-19 LAB — C-REACTIVE PROTEIN: CRP: <0.5 mg/dL — ABNORMAL LOW (ref <0.60)

## 2014-05-19 LAB — LACTATE DEHYDROGENASE: LDH: 319 U/L — ABNORMAL HIGH (ref 94–250)

## 2014-05-19 MED ORDER — SODIUM CHLORIDE 0.9 % IV SOLN
INTRAVENOUS | Status: DC
  Administered 2014-05-19: 14:00:00 via INTRAVENOUS

## 2014-05-19 MED ORDER — ZOLEDRONIC ACID 4 MG/5ML IV CONC
2.0000 mg | Freq: Once | INTRAVENOUS | Status: AC
  Administered 2014-05-19: 2 mg via INTRAVENOUS
  Filled 2014-05-19: qty 2.5

## 2014-05-19 MED ORDER — SODIUM CHLORIDE 0.9 % IJ SOLN
10.0000 mL | Freq: Once | INTRAMUSCULAR | Status: AC
  Administered 2014-05-19: 10 mL via INTRAVENOUS

## 2014-05-19 MED ORDER — HEPARIN SOD (PORK) LOCK FLUSH 100 UNIT/ML IV SOLN
500.0000 [IU] | Freq: Once | INTRAVENOUS | Status: AC
  Administered 2014-05-19: 500 [IU] via INTRAVENOUS
  Filled 2014-05-19: qty 5

## 2014-05-19 NOTE — Patient Instructions (Signed)
[place] Discharge Instructions  RECOMMENDATIONS MADE BY THE CONSULTANT AND ANY TEST RESULTS WILL BE SENT TO YOUR REFERRING PHYSICIAN.  MEDICATIONS PRESCRIBED:  Zometa 2 mg IV infusion today.  INSTRUCTIONS/FOLLOW-UP: Return as scheduled every 6 weeks for lab work and Zometa infusion.  Thank you for choosing [facility] to provide your oncology and hematology care.  To afford each patient quality time with our providers, please arrive at least 15 minutes before your scheduled appointment time.  With your help, our goal is to use those 15 minutes to complete the necessary work-up to ensure our physicians have the information they need to help with your evaluation and healthcare recommendations.    Effective January 1st, 2014, we ask that you re-schedule your appointment with our physicians should you arrive 10 or more minutes late for your appointment.  We strive to give you quality time with our providers, and arriving late affects you and other patients whose appointments are after yours.    Again, thank you for choosing [facility].  Our hope is that these requests will decrease the amount of time that you wait before being seen by our physicians.       _____________________________________________________________  Should you have questions after your visit to [facility], please contact our office at [phone] between the hours of 8:30 a.m. and 4:30 p.m.  Voicemails left after 4:30 p.m. will not be returned until the following business day.  For prescription refill requests, have your pharmacy contact our office with your prescription refill request.    _______________________________________________________________  We hope that we have given you very good care.  You may receive a patient satisfaction survey in the mail, please complete it and return it as soon as possible.  We value your  feedback!  _______________________________________________________________  Have you asked about our STAR program?  STAR stands for Survivorship Training and Rehabilitation, and this is a nationally recognized cancer care program that focuses on survivorship and rehabilitation.  Cancer and cancer treatments may cause problems, such as, pain, making you feel tired and keeping you from doing the things that you need or want to do. Cancer rehabilitation can help. Our goal is to reduce these troubling effects and help you have the best quality of life possible.  You may receive a survey from a nurse that asks questions about your current state of health.  Based on the survey results, all eligible patients will be referred to the [facility] program for an evaluation so we can better serve you!  A frequently asked questions sheet is available upon request.

## 2014-05-22 ENCOUNTER — Other Ambulatory Visit (HOSPITAL_COMMUNITY): Payer: Self-pay | Admitting: Oncology

## 2014-05-22 ENCOUNTER — Other Ambulatory Visit: Payer: Self-pay | Admitting: Cardiovascular Disease

## 2014-05-22 LAB — KAPPA/LAMBDA LIGHT CHAINS
Kappa free light chain: 3.62 mg/dL — ABNORMAL HIGH (ref 0.33–1.94)
Kappa, lambda light chain ratio: 1.64 (ref 0.26–1.65)
Lambda free light chains: 2.21 mg/dL (ref 0.57–2.63)

## 2014-05-22 LAB — BETA 2 MICROGLOBULIN, SERUM: Beta-2 Microglobulin: 5.2 mg/L — ABNORMAL HIGH (ref <=2.51)

## 2014-05-23 LAB — MULTIPLE MYELOMA PANEL, SERUM
Albumin ELP: 60.4 % (ref 55.8–66.1)
Alpha-1-Globulin: 6 % — ABNORMAL HIGH (ref 2.9–4.9)
Alpha-2-Globulin: 12.5 % — ABNORMAL HIGH (ref 7.1–11.8)
Beta 2: 3.9 % (ref 3.2–6.5)
Beta Globulin: 8.4 % — ABNORMAL HIGH (ref 4.7–7.2)
Gamma Globulin: 8.8 % — ABNORMAL LOW (ref 11.1–18.8)
IgA: 86 mg/dL (ref 68–379)
IgG (Immunoglobin G), Serum: 465 mg/dL — ABNORMAL LOW (ref 650–1600)
IgM, Serum: 54 mg/dL — ABNORMAL LOW (ref 41–251)
M-Spike, %: NOT DETECTED g/dL
Total Protein: 5.9 g/dL — ABNORMAL LOW (ref 6.0–8.3)

## 2014-05-25 ENCOUNTER — Encounter (HOSPITAL_COMMUNITY): Payer: Self-pay

## 2014-05-30 ENCOUNTER — Ambulatory Visit (INDEPENDENT_AMBULATORY_CARE_PROVIDER_SITE_OTHER): Payer: Managed Care, Other (non HMO) | Admitting: Internal Medicine

## 2014-06-05 ENCOUNTER — Other Ambulatory Visit (HOSPITAL_COMMUNITY): Payer: Self-pay | Admitting: Oncology

## 2014-06-05 DIAGNOSIS — C9000 Multiple myeloma not having achieved remission: Secondary | ICD-10-CM

## 2014-06-05 MED ORDER — LENALIDOMIDE 5 MG PO CAPS: ORAL_CAPSULE | ORAL | Status: DC

## 2014-06-06 ENCOUNTER — Emergency Department (HOSPITAL_COMMUNITY)
Admission: EM | Admit: 2014-06-06 | Discharge: 2014-06-06 | Disposition: A | Discharge status: Home / Self Care | Payer: Managed Care, Other (non HMO) | Attending: Emergency Medicine | Admitting: Emergency Medicine

## 2014-06-06 ENCOUNTER — Encounter (HOSPITAL_COMMUNITY): Payer: Self-pay | Admitting: Emergency Medicine

## 2014-06-06 DIAGNOSIS — R2242 Localized swelling, mass and lump, left lower limb: Secondary | ICD-10-CM | POA: Diagnosis not present

## 2014-06-06 DIAGNOSIS — Z7951 Long term (current) use of inhaled steroids: Secondary | ICD-10-CM | POA: Insufficient documentation

## 2014-06-06 DIAGNOSIS — Z79899 Other long term (current) drug therapy: Secondary | ICD-10-CM | POA: Diagnosis not present

## 2014-06-06 DIAGNOSIS — Z9981 Dependence on supplemental oxygen: Secondary | ICD-10-CM | POA: Insufficient documentation

## 2014-06-06 DIAGNOSIS — I2510 Atherosclerotic heart disease of native coronary artery without angina pectoris: Secondary | ICD-10-CM | POA: Insufficient documentation

## 2014-06-06 DIAGNOSIS — Z794 Long term (current) use of insulin: Secondary | ICD-10-CM | POA: Insufficient documentation

## 2014-06-06 DIAGNOSIS — I129 Hypertensive chronic kidney disease with stage 1 through stage 4 chronic kidney disease, or unspecified chronic kidney disease: Secondary | ICD-10-CM | POA: Insufficient documentation

## 2014-06-06 DIAGNOSIS — I4892 Unspecified atrial flutter: Secondary | ICD-10-CM | POA: Insufficient documentation

## 2014-06-06 DIAGNOSIS — Z9889 Other specified postprocedural states: Secondary | ICD-10-CM | POA: Diagnosis not present

## 2014-06-06 DIAGNOSIS — S80829A Blister (nonthermal), unspecified lower leg, initial encounter: Secondary | ICD-10-CM

## 2014-06-06 DIAGNOSIS — R2241 Localized swelling, mass and lump, right lower limb: Secondary | ICD-10-CM | POA: Diagnosis present

## 2014-06-06 DIAGNOSIS — L988 Other specified disorders of the skin and subcutaneous tissue: Secondary | ICD-10-CM | POA: Diagnosis not present

## 2014-06-06 DIAGNOSIS — G4730 Sleep apnea, unspecified: Secondary | ICD-10-CM | POA: Diagnosis not present

## 2014-06-06 DIAGNOSIS — Z87891 Personal history of nicotine dependence: Secondary | ICD-10-CM | POA: Diagnosis not present

## 2014-06-06 DIAGNOSIS — N183 Chronic kidney disease, stage 3 (moderate): Secondary | ICD-10-CM | POA: Diagnosis not present

## 2014-06-06 DIAGNOSIS — E785 Hyperlipidemia, unspecified: Secondary | ICD-10-CM | POA: Diagnosis not present

## 2014-06-06 DIAGNOSIS — Z87828 Personal history of other (healed) physical injury and trauma: Secondary | ICD-10-CM | POA: Insufficient documentation

## 2014-06-06 DIAGNOSIS — Z8579 Personal history of other malignant neoplasms of lymphoid, hematopoietic and related tissues: Secondary | ICD-10-CM | POA: Diagnosis not present

## 2014-06-06 DIAGNOSIS — E119 Type 2 diabetes mellitus without complications: Secondary | ICD-10-CM | POA: Insufficient documentation

## 2014-06-06 DIAGNOSIS — Z7901 Long term (current) use of anticoagulants: Secondary | ICD-10-CM | POA: Insufficient documentation

## 2014-06-06 DIAGNOSIS — M109 Gout, unspecified: Secondary | ICD-10-CM | POA: Insufficient documentation

## 2014-06-06 DIAGNOSIS — I252 Old myocardial infarction: Secondary | ICD-10-CM | POA: Insufficient documentation

## 2014-06-06 DIAGNOSIS — Z9861 Coronary angioplasty status: Secondary | ICD-10-CM | POA: Insufficient documentation

## 2014-06-06 DIAGNOSIS — K219 Gastro-esophageal reflux disease without esophagitis: Secondary | ICD-10-CM | POA: Diagnosis not present

## 2014-06-06 LAB — CBG MONITORING, ED: Glucose-Capillary: 167 mg/dL — ABNORMAL HIGH (ref 70–99)

## 2014-06-06 NOTE — ED Notes (Signed)
Pt states he noticed a small blister that had formed to his L. Shin area. Blister then became much larger and was fluid filled so pt called his MD and was advised to come here for evaluation. Pt has history of diabetes. Pt has approximately quarter sized fluid filled blister noted just below L. Knee area.

## 2014-06-06 NOTE — Discharge Instructions (Signed)
    Blisters Blisters are fluid-filled sacs that form within the skin. Common causes of blistering are friction, burns, and exposure to irritating chemicals. The fluid in the blister protects the underlying damaged skin. Most of the time it is not recommended that you open blisters. When a blister is opened, there is an increased chance for infection. Usually, a blister will open on its own. They then dry up and peel off within 10 days. If the blister is tense and uncomfortable (painful) the fluid may be drained. If it is drained the roof of the blister should be left intact. The draining should only be done by a medical professional under aseptic conditions. Poorly fitting shoes and boots can cause blisters by being too tight or too loose. Wearing extra socks or using tape, bandages, or pads over the blister-prone area helps prevent the problem by reducing friction. Blisters heal more slowly if you have diabetes or if you have problems with your circulation. You need to be careful about medical follow-up to prevent infection. HOME CARE INSTRUCTIONS  Protect areas where blisters have formed until the skin is healed. Use a special bandage with a hole cut in the middle around the blister. This reduces pressure and friction. When the blister breaks, trim off the loose skin and keep the area clean by washing it with soap daily. Soaking the blister or broken-open blister with diluted vinegar twice daily for 15 minutes will dry it up and speed the healing. Use 3 tablespoons of white vinegar per quart of water (45 mL white vinegar per liter of water). An antibiotic ointment and a bandage can be used to cover the area after soaking.  SEEK MEDICAL CARE IF:   You develop increased redness, pain, swelling, or drainage in the blistered area.  You develop a pus-like discharge from the blistered area, chills, or a fever. MAKE SURE YOU:   Understand these instructions.  Will watch your condition.  Will get help  right away if you are not doing well or get worse. Document Released: 06/26/2004 Document Revised: 08/11/2011 Document Reviewed: 05/24/2008 ExitCare Patient Information 2015 [employer]. This information is not intended to replace advice given to you by your health care provider. Make sure you discuss any questions you have with your health care provider.  

## 2014-06-06 NOTE — ED Provider Notes (Signed)
CSN: [id number]     Arrival date & time 06/06/14  1916 History   First MD Initiated Contact with Patient 06/06/14 2035     Chief Complaint  Patient presents with  . Leg Swelling    HPI Pt noticed a blister on his shin area.  He tried applying pressure to the area but it has increased in size.  He has chronic swelling in both legs but has not noticed any changes.  No fever or dainage.  No pain.  He called his doctor and was told to come to the ED for evaluation. Past Medical History  Diagnosis Date  . Arteriosclerotic cardiovascular disease (ASCVD)     MI-2000s; stent to the proximal LAD and diagonal in 2001; stress nuclear in 2008-impaired exercise capacity, left ventricular dilatation, moderately to severely depressed EF, apical, inferior and anteroseptal scar  . Hypertension   . 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 Diabetes mellitus     Insulin  . GERD (gastroesophageal reflux disease)   . Pedal edema     Venous insufficiency  . Obesity   . Hyperlipidemia   . Injection site reaction   . Cellulitis of leg     both legs  . Chronic kidney disease, stage 3, mod decreased GFR     Creatinine of 1.84 in 06/2011 and 1.5 in 07/2011  . Chronic diarrhea   . Sleep apnea     uses cpap  . Ulcer   . Atrial flutter   . Multiple myeloma 07/01/2011  . Myocardial infarction 2000  . Gout    Past Surgical History  Procedure Laterality Date  . Laparoscopic gastric banding  2006    has been removed  . Wrist surgery      Left; removal of bone fragment  . Incision and drainage abscess anal    . Abscess drainage      Scrotal  . Bone marrow biopsy  05/13/11  . Portacath placement  07/07/2011    Procedure: INSERTION PORT-A-CATH;  Surgeon: Scherry Ran, MD;  Location: [facility];  Service: General;  Laterality: N/A;  . Colonoscopy  11/28/2011    Procedure: COLONOSCOPY;  Surgeon: Rogene Houston, MD;  Location: [facility];  Service: Endoscopy;  Laterality: N/A;  930  . Esophagogastroduodenoscopy   01/02/2012    Procedure: ESOPHAGOGASTRODUODENOSCOPY (EGD);  Surgeon: Rogene Houston, MD;  Location: [facility];  Service: Endoscopy;  Laterality: N/A;  100  . Esophageal biopsy  01/02/2012    Procedure: BIOPSY;  Surgeon: Rogene Houston, MD;  Location: [facility];  Service: Endoscopy;  Laterality: N/A;  . Esophagogastroduodenoscopy N/A 09/20/2012    Procedure: ESOPHAGOGASTRODUODENOSCOPY (EGD);  Surgeon: Rogene Houston, MD;  Location: [facility];  Service: Endoscopy;  Laterality: N/A;  . Eus N/A 10/07/2012    Procedure: UPPER ENDOSCOPIC ULTRASOUND (EUS) LINEAR;  Surgeon: Milus Banister, MD;  Location: [facility];  Service: Endoscopy;  Laterality: N/A;  . Cardioversion N/A 10/13/2012    Procedure: CARDIOVERSION;  Surgeon: Yehuda Savannah, MD;  Location: [facility];  Service: Cardiovascular;  Laterality: N/A;  . Cardiac catheterization      cardiac stent  . Port-a-cath removal Left 12/07/2012    Procedure: REMOVAL PORT-A-CATH;  Surgeon: Scherry Ran, MD;  Location: [facility];  Service: General;  Laterality: Left;  . Portacath placement N/A 12/07/2012    Procedure: INSERTION PORT-A-CATH;  Surgeon: Scherry Ran, MD;  Location: [facility];  Service: General;  Laterality: N/A;  Attempted portacath placement  on left and right side  . Cataract extraction w/phaco Left 02/13/2014    Procedure: CATARACT EXTRACTION PHACO AND INTRAOCULAR LENS PLACEMENT (IOC);  Surgeon: Tonny Branch, MD;  Location: [facility];  Service: Ophthalmology;  Laterality: Left;  CDE:  7.67  . Cataract extraction w/phaco Right 03/02/2014    Procedure: CATARACT EXTRACTION PHACO AND INTRAOCULAR LENS PLACEMENT RIGHT EYE CDE=16.81;  Surgeon: Tonny Branch, MD;  Location: [facility];  Service: Ophthalmology;  Laterality: Right;   Family History  Problem Relation Age of Onset  . Heart disease Mother   . Cancer Mother   . Diabetes Father   . Anesthesia problems Neg Hx   . Hypotension Neg Hx   . Malignant hyperthermia Neg Hx   . Pseudochol  deficiency Neg Hx   . Arthritis     History  Substance Use Topics  . Smoking status: Former Smoker -- 0.25 packs/day for 1 years    Types: Cigarettes, Cigars    Quit date: 05/17/2001  . Smokeless tobacco: Never Used  . Alcohol Use: No    Review of Systems  Constitutional: Negative for fever.  Respiratory: Negative for shortness of breath.   Cardiovascular: Negative for chest pain.      Allergies  Review of patient's allergies indicates no known allergies.  Home Medications   Prior to Admission medications   Medication Sig Start Date End Date Taking? Authorizing Provider  acyclovir (ZOVIRAX) 400 MG tablet Take 1 tablet (400 mg total) by mouth daily. 08/30/13  Yes Manon Hilding Kefalas, PA-C  allopurinol (ZYLOPRIM) 300 MG tablet TAKE ONE TABLET BY MOUTH ONCE DAILY. 05/22/14  Yes Manon Hilding Kefalas, PA-C  atorvastatin (LIPITOR) 80 MG tablet TAKE ONE TABLET BY MOUTH AT BEDTIME.   Yes Herminio Commons, MD  calcitRIOL (ROCALTROL) 0.25 MCG capsule Take 0.25 mcg by mouth daily. 12/14/13  Yes Historical Provider, MD  calcium carbonate (OS-CAL) 600 MG TABS tablet Take 1,200 mg by mouth 2 (two) times daily.   Yes Historical Provider, MD  dexamethasone (DECADRON) 4 MG tablet TAKE 5 TABLETS BY MOUTH TWICE DAILY EVERY FRIDAY. 04/05/14  Yes Manon Hilding Kefalas, PA-C  dexlansoprazole (DEXILANT) 60 MG capsule Take 60 mg by mouth every morning.    Yes Historical Provider, MD  diphenoxylate-atropine (LOMOTIL) 2.5-0.025 MG per tablet Take 2 tablets by mouth 2 (two) times daily. 03/02/14  Yes Rogene Houston, MD  ergocalciferol (VITAMIN D2) 50000 UNITS capsule 50,000 units weekly x 4 weeks and then 50,000 units monthly Patient taking differently: Take 50,000 Units by mouth every 30 (thirty) days.  08/25/13  Yes Baird Cancer, PA-C  HYDROcodone-acetaminophen (NORCO/VICODIN) 5-325 MG per tablet Take 1-2 tablets by mouth every 4 (four) hours as needed. Patient taking differently: Take 1-2 tablets by mouth every  4 (four) hours as needed for moderate pain or severe pain.  10/23/13  Yes Antonietta Breach, PA-C  insulin detemir (LEVEMIR) 100 UNIT/ML injection Inject 40 Units into the skin every morning.  09/08/12  Yes Kathie Dike, MD  insulin lispro (HUMALOG) 100 UNIT/ML injection Inject 5-20 Units into the skin 2 (two) times daily with a meal. Sliding Scale per patient   Yes Historical Provider, MD  lenalidomide ([name]) 5 MG capsule Take 1 tablet PO 7 days on and 7 days off. Patient taking differently: Take 5 mg by mouth daily. Take 1 tablet PO 7 days on and 7 days off. 06/05/14  Yes Baird Cancer, PA-C  lidocaine-prilocaine (EMLA) cream Apply 1 application topically as needed (for port-a-cath  access).    Yes Historical Provider, MD  lipase/protease/amylase (CREON) 36000 UNITS CPEP capsule TAKE 4 CAPSULES BY MOUTH DAILY WITH MEALS AND 2 CAPSULES WITH SNACKS. 04/26/14  Yes Butch Penny, NP  lisinopril (PRINIVIL,ZESTRIL) 5 MG tablet Take 5 mg by mouth every other day. 12/14/13  Yes Historical Provider, MD  loratadine (CLARITIN) 10 MG tablet Take 10 mg by mouth every morning.    Yes Historical Provider, MD  metoprolol succinate (TOPROL-XL) 25 MG 24 hr tablet TAKE ONE TABLET BY MOUTH ONCE DAILY. 05/22/14  Yes Evans Lance, MD  Multiple Vitamins-Minerals (MULTIVITAMINS THER. W/MINERALS) TABS Take 1 tablet by mouth every morning.    Yes Historical Provider, MD  nitroGLYCERIN (NITROSTAT) 0.4 MG SL tablet Place 1 tablet (0.4 mg total) under the tongue every 5 (five) minutes as needed for chest pain. 09/17/12  Yes Lendon Colonel, NP  potassium chloride SA (K-DUR,KLOR-CON) 20 MEQ tablet TAKE 2 TABLETS BY MOUTH TWICE DAILY. Patient taking differently: Take 20-40 mEq by mouth. TAKE 2 TABLETS BY MOUTH in the morning and 1 at bedtime 04/26/14  Yes Manon Hilding Kefalas, PA-C  rivaroxaban (XARELTO) 20 MG TABS tablet TAKE 1 TABLET BY MOUTH DAILY. Patient taking differently: Take 20 mg by mouth every evening. TAKE 1 TABLET BY  MOUTH DAILY. 12/21/13  Yes Lendon Colonel, NP  sulfamethoxazole-trimethoprim (BACTRIM DS) 800-160 MG per tablet Take 1 tablet by mouth every Monday, Wednesday, and Friday. 12/21/13  Yes Manon Hilding Kefalas, PA-C  torsemide (DEMADEX) 20 MG tablet Take 1 tablet (20 mg total) by mouth 2 (two) times daily. TWO TABS IN AM, ONE TAB IN PM Patient taking differently: Take 20-40 mg by mouth 2 (two) times daily. TWO TABS IN AM, ONE TAB IN PM 04/26/14  Yes Butch Penny, NP  Zoledronic Acid (ZOMETA IV) Inject 2 mg into the vein every 28 (twenty-eight) days.    Yes Historical Provider, MD   BP 118/52 mmHg  Pulse 56  Temp(Src) 99.5 F (37.5 C) (Oral)  Resp 19  Ht '6\' 3"'  (1.905 m)  Wt 332 lb (150.594 kg)  BMI 41.50 kg/m2  SpO2 100% Physical Exam  Constitutional: He appears well-developed and well-nourished. No distress.  Morbidly obese  HENT:  Head: Normocephalic and atraumatic.  Right Ear: External ear normal.  Left Ear: External ear normal.  Eyes: Conjunctivae are normal. Right eye exhibits no discharge. Left eye exhibits no discharge. No scleral icterus.  Neck: Neck supple. No tracheal deviation present.  Cardiovascular: Normal rate.   Pulmonary/Chest: Effort normal. No stridor. No respiratory distress.  Musculoskeletal: He exhibits edema.  Chronic lymphedema bilateral lower extremities, approximately quarter sized superficial blister on the left anterior shin, no erythema  Neurological: He is alert. Cranial nerve deficit: no gross deficits.  Skin: Skin is warm and dry. No rash noted.  Psychiatric: He has a normal mood and affect.  Nursing note and vitals reviewed.   ED Course  Procedures (including critical care time) Labs Review Labs Reviewed  CBG MONITORING, ED - Abnormal; Notable for the following:    Glucose-Capillary 167 (*)    All other components within normal limits   Betadine ointment was applied to the blister. Using a sterile 18-gauge needle the serous fluid was drained,  antiseptic was applied to his skin and a sterile Band-Aid was placed on the wound. Patient tolerated the procedure well  MDM   Final diagnoses:  Blister of leg    Patient has a small blister on his leg. Most  likely related to his chronic lymphedema. There is no evidence of infection.  At this time there does not appear to be any evidence of an acute emergency medical condition and the patient appears stable for discharge with appropriate outpatient follow up.     Dorie Rank, MD 06/06/14 2104

## 2014-06-11 ENCOUNTER — Other Ambulatory Visit (HOSPITAL_COMMUNITY): Payer: Self-pay | Admitting: Oncology

## 2014-06-12 ENCOUNTER — Ambulatory Visit (INDEPENDENT_AMBULATORY_CARE_PROVIDER_SITE_OTHER): Payer: Managed Care, Other (non HMO) | Admitting: Internal Medicine

## 2014-06-12 ENCOUNTER — Encounter (INDEPENDENT_AMBULATORY_CARE_PROVIDER_SITE_OTHER): Payer: Self-pay | Admitting: Internal Medicine

## 2014-06-12 VITALS — BP 116/72 | HR 74 | Temp 97.1°F | Resp 18 | Ht 72.0 in | Wt 346.1 lb

## 2014-06-12 DIAGNOSIS — R740 Nonspecific elevation of levels of transaminase and lactic acid dehydrogenase [LDH]: Secondary | ICD-10-CM

## 2014-06-12 DIAGNOSIS — K903 Pancreatic steatorrhea: Secondary | ICD-10-CM

## 2014-06-12 DIAGNOSIS — R7401 Elevation of levels of liver transaminase levels: Secondary | ICD-10-CM

## 2014-06-12 NOTE — Patient Instructions (Signed)
Decrease Creon dose to 2 capsules each meal and one with snack for the next 1 week. Physician will call with results of blood work

## 2014-06-12 NOTE — Progress Notes (Signed)
Presenting complaint;  Follow-up for chronic diarrhea.  Database;  Patient is 53-year-old medical male who was evaluated for chronic diarrhea back in July 2013 and stool studies confirmed steatorrhea felt to be secondary to pancreatic insufficiency. His diarrhea started while he was undergoing therapy for multiple myeloma. He also has history of mildly elevated transaminases felt to be secondary to fatty liver. He was last seen on 02/28/2014.  Subjective:  Patient presents for scheduled visit accompanied by his wife. He continues to complain of diarrhea. He has an average of 3-5 stools per day. Most of his stools are watery or washy. He may have a normal stool once a week. He continues to have excessive flatus and intermittent epigastric pain. He is afraid to eat at a restaurant because of postprandial bowel movements and urgency. He states he is not having epigastric pain as often as he used to and it does not last as long. His appetite is normal. He tries to stay on low-fat diet. He denies melena or rectal bleeding.  He does not believe that higher dose of pancreatic enzyme supplement made a big difference.  On his last visit he was given prescription for metronidazole for 2 weeks which provided temporary relief from flatus He has gained 26 pounds since his last visit. He feels most of the weight gain is secondary to fluid retention. He developed cellulitis involving left leg. He states he is on 60-80 mg of torsemide daily depending on his lower extremity edema. He usually takes it in split dose. He believes he is also on sodium bicarbonate but does not know the dose. He will call our office with this information.   Current Medications: Outpatient Encounter Prescriptions as of 06/12/2014  Medication Sig  . acyclovir (ZOVIRAX) 400 MG tablet Take 1 tablet (400 mg total) by mouth daily.  . allopurinol (ZYLOPRIM) 300 MG tablet TAKE ONE TABLET BY MOUTH ONCE DAILY.  . atorvastatin (LIPITOR) 80 MG  tablet TAKE ONE TABLET BY MOUTH AT BEDTIME.  . calcitRIOL (ROCALTROL) 0.25 MCG capsule Take 0.25 mcg by mouth daily.  . calcium carbonate (OS-CAL) 600 MG TABS tablet Take 1,200 mg by mouth 2 (two) times daily.  . dexamethasone (DECADRON) 4 MG tablet TAKE 5 TABLETS BY MOUTH TWICE DAILY EVERY FRIDAY.  . dexlansoprazole (DEXILANT) 60 MG capsule Take 60 mg by mouth every morning.   . diphenoxylate-atropine (LOMOTIL) 2.5-0.025 MG per tablet Take 2 tablets by mouth 2 (two) times daily.  . ergocalciferol (VITAMIN D2) 50000 UNITS capsule 50,000 units weekly x 4 weeks and then 50,000 units monthly (Patient taking differently: Take 50,000 Units by mouth every 30 (thirty) days. )  . HYDROcodone-acetaminophen (NORCO/VICODIN) 5-325 MG per tablet Take 1-2 tablets by mouth every 4 (four) hours as needed. (Patient taking differently: Take 1-2 tablets by mouth every 4 (four) hours as needed for moderate pain or severe pain. )  . insulin detemir (LEVEMIR) 100 UNIT/ML injection Inject 40 Units into the skin every morning.   . insulin lispro (HUMALOG) 100 UNIT/ML injection Inject 5-20 Units into the skin 2 (two) times daily with a meal. Sliding Scale per patient  . lenalidomide ([name]) 5 MG capsule Take 1 tablet PO 7 days on and 7 days off. (Patient taking differently: Take 5 mg by mouth daily. Take 1 tablet PO 7 days on and 7 days off.)  . lidocaine-prilocaine (EMLA) cream Apply 1 application topically as needed (for port-a-cath access).   . lipase/protease/amylase (CREON) 36000 UNITS CPEP capsule TAKE 4 CAPSULES BY   MOUTH DAILY WITH MEALS AND 2 CAPSULES WITH SNACKS.  Marland Kitchen lisinopril (PRINIVIL,ZESTRIL) 5 MG tablet Take 5 mg by mouth every other day.  . loratadine (CLARITIN) 10 MG tablet Take 10 mg by mouth every morning.   . metoprolol succinate (TOPROL-XL) 25 MG 24 hr tablet TAKE ONE TABLET BY MOUTH ONCE DAILY.  . Multiple Vitamins-Minerals (MULTIVITAMINS THER. W/MINERALS) TABS Take 1 tablet by mouth every morning.    . nitroGLYCERIN (NITROSTAT) 0.4 MG SL tablet Place 1 tablet (0.4 mg total) under the tongue every 5 (five) minutes as needed for chest pain.  . potassium chloride SA (K-DUR,KLOR-CON) 20 MEQ tablet TAKE 2 TABLETS BY MOUTH TWICE DAILY. (Patient taking differently: Take 20-40 mEq by mouth. TAKE 2 TABLETS BY MOUTH in the morning and 1 at bedtime)  . rivaroxaban (XARELTO) 20 MG TABS tablet TAKE 1 TABLET BY MOUTH DAILY. (Patient taking differently: Take 20 mg by mouth every evening. TAKE 1 TABLET BY MOUTH DAILY.)  . sulfamethoxazole-trimethoprim (BACTRIM DS,SEPTRA DS) 800-160 MG per tablet TAKE 1 TABLET BY MOUTH EVERY MONDAY, WEDNESDAY, AND FRIDAY.  Marland Kitchen torsemide (DEMADEX) 20 MG tablet Take 1 tablet (20 mg total) by mouth 2 (two) times daily. TWO TABS IN AM, ONE TAB IN PM (Patient taking differently: Take 20-40 mg by mouth 2 (two) times daily. TWO TABS IN AM, ONE TAB IN PM)  . Zoledronic Acid (ZOMETA IV) Inject 2 mg into the vein every 28 (twenty-eight) days.      Objective: Blood pressure 116/72, pulse 74, temperature 97.1 F (36.2 C), temperature source Oral, resp. rate 18, height 6' (1.829 m), weight 346 lb 1.6 oz (156.99 kg). She was alert and in no acute distress. Conjunctiva is pink. Sclera is nonicteric Oropharyngeal mucosa is normal. No neck masses or thyromegaly noted. Cardiac exam with regular rhythm normal S1 and S2. No murmur or gallop noted. Lungs are clear to auscultation. Abdomen is obese. He has large ecchymosis at right and left lower quadrant of his abdomen. Bowel sounds are normal. Percussion note is tympanitic and epigastric region. Abdomen is soft with mild midepigastric tenderness. No organomegaly or masses.  He has 2+ pitting edema involving legs.  Labs/studies Results: Lab data from 05/19/2014. WBC 5.5, H&H 9.9 and 29.9 and platelet count 140 K BUN 32 and creatinine 1.76. Bilirubin 0.4, AP 120, AST 162, ALT 163 and albumin 3.2. AST and ALT were 42 and 63 respectively on  04/18/2014. AST and ALT were 22 and 48 respectively on 02/20/2014.    Assessment:  #1. Steatorrhea felt to be secondary to pancreatic insufficiency. And insulin well controlled with higher dose of pancreatic enzyme supplements. Dose dose still appears to be low if based on body weight. Since he is not responding to pancreatic enzyme supplement I would like to drop the dose to half and see what happens. His diarrhea may have to be reevaluated with repeat stool studies. #2. Elevated transaminases. AST and ALT have increased significantly between November and December 2015. He has mild thrombocytopenia but no findings of portal hypertension. #3. History of multiple myeloma. He is on maintenance therapy and appears to be in remission.    Plan:  LFTs in 1 week. Decrease Creon 36K to two with each meal and one with snack. Progress report in 2 weeks. Office visit in 3 months.

## 2014-06-14 LAB — HEPATIC FUNCTION PANEL
ALT: 92 U/L — ABNORMAL HIGH (ref 0–53)
AST: 78 U/L — ABNORMAL HIGH (ref 0–37)
Albumin: 3.3 g/dL — ABNORMAL LOW (ref 3.5–5.2)
Alkaline Phosphatase: 111 U/L (ref 39–117)
Bilirubin, Direct: 0.1 mg/dL (ref 0.0–0.3)
Indirect Bilirubin: 0.3 mg/dL (ref 0.2–1.2)
Total Bilirubin: 0.4 mg/dL (ref 0.2–1.2)
Total Protein: 5.3 g/dL — ABNORMAL LOW (ref 6.0–8.3)

## 2014-06-20 ENCOUNTER — Other Ambulatory Visit (INDEPENDENT_AMBULATORY_CARE_PROVIDER_SITE_OTHER): Payer: Self-pay | Admitting: Internal Medicine

## 2014-06-20 DIAGNOSIS — R143 Flatulence: Secondary | ICD-10-CM

## 2014-06-20 DIAGNOSIS — G8929 Other chronic pain: Secondary | ICD-10-CM

## 2014-06-20 DIAGNOSIS — R1013 Epigastric pain: Secondary | ICD-10-CM

## 2014-06-20 DIAGNOSIS — R748 Abnormal levels of other serum enzymes: Secondary | ICD-10-CM

## 2014-06-20 DIAGNOSIS — R197 Diarrhea, unspecified: Secondary | ICD-10-CM

## 2014-06-22 ENCOUNTER — Other Ambulatory Visit (HOSPITAL_COMMUNITY): Payer: Self-pay

## 2014-06-22 ENCOUNTER — Ambulatory Visit (HOSPITAL_COMMUNITY)
Admission: RE | Admit: 2014-06-22 | Discharge: 2014-06-22 | Disposition: A | Discharge status: Home / Self Care | Payer: Managed Care, Other (non HMO) | Source: Ambulatory Visit | Attending: Internal Medicine | Admitting: Internal Medicine

## 2014-06-22 DIAGNOSIS — R934 Abnormal findings on diagnostic imaging of urinary organs: Secondary | ICD-10-CM | POA: Diagnosis not present

## 2014-06-22 DIAGNOSIS — R748 Abnormal levels of other serum enzymes: Secondary | ICD-10-CM | POA: Diagnosis not present

## 2014-06-22 DIAGNOSIS — R1013 Epigastric pain: Secondary | ICD-10-CM | POA: Diagnosis present

## 2014-06-22 DIAGNOSIS — R143 Flatulence: Secondary | ICD-10-CM

## 2014-06-22 DIAGNOSIS — G8929 Other chronic pain: Secondary | ICD-10-CM

## 2014-06-22 DIAGNOSIS — R197 Diarrhea, unspecified: Secondary | ICD-10-CM

## 2014-06-22 DIAGNOSIS — K8020 Calculus of gallbladder without cholecystitis without obstruction: Secondary | ICD-10-CM | POA: Diagnosis not present

## 2014-06-29 ENCOUNTER — Encounter (HOSPITAL_COMMUNITY): Payer: Managed Care, Other (non HMO) | Attending: Oncology

## 2014-06-29 ENCOUNTER — Other Ambulatory Visit (HOSPITAL_COMMUNITY): Payer: Self-pay | Admitting: Oncology

## 2014-06-29 ENCOUNTER — Other Ambulatory Visit (INDEPENDENT_AMBULATORY_CARE_PROVIDER_SITE_OTHER): Payer: Self-pay | Admitting: Internal Medicine

## 2014-06-29 ENCOUNTER — Encounter (HOSPITAL_BASED_OUTPATIENT_CLINIC_OR_DEPARTMENT_OTHER): Payer: Managed Care, Other (non HMO)

## 2014-06-29 ENCOUNTER — Encounter (HOSPITAL_COMMUNITY): Payer: Self-pay

## 2014-06-29 DIAGNOSIS — C9000 Multiple myeloma not having achieved remission: Secondary | ICD-10-CM

## 2014-06-29 DIAGNOSIS — R935 Abnormal findings on diagnostic imaging of other abdominal regions, including retroperitoneum: Secondary | ICD-10-CM

## 2014-06-29 LAB — COMPREHENSIVE METABOLIC PANEL
ALT: 79 U/L — ABNORMAL HIGH (ref 0–53)
AST: 40 U/L — ABNORMAL HIGH (ref 0–37)
Albumin: 3.3 g/dL — ABNORMAL LOW (ref 3.5–5.2)
Alkaline Phosphatase: 94 U/L (ref 39–117)
Anion gap: 7 (ref 5–15)
BUN: 34 mg/dL — ABNORMAL HIGH (ref 6–23)
CO2: 23 mmol/L (ref 19–32)
Calcium: 8.4 mg/dL (ref 8.4–10.5)
Chloride: 111 mmol/L (ref 96–112)
Creatinine, Ser: 1.77 mg/dL — ABNORMAL HIGH (ref 0.50–1.35)
GFR calc Af Amer: 48 mL/min — ABNORMAL LOW (ref >90)
GFR calc non Af Amer: 42 mL/min — ABNORMAL LOW (ref >90)
Glucose, Bld: 131 mg/dL — ABNORMAL HIGH (ref 70–99)
Potassium: 3.8 mmol/L (ref 3.5–5.1)
Sodium: 141 mmol/L (ref 135–145)
Total Bilirubin: 0.5 mg/dL (ref 0.3–1.2)
Total Protein: 5.6 g/dL — ABNORMAL LOW (ref 6.0–8.3)

## 2014-06-29 LAB — CBC WITH DIFFERENTIAL/PLATELET
Basophils Absolute: 0 10*3/uL (ref 0.0–0.1)
Basophils Relative: 0 % (ref 0–1)
Eosinophils Absolute: 0.3 10*3/uL (ref 0.0–0.7)
Eosinophils Relative: 5 % (ref 0–5)
HCT: 29.7 % — ABNORMAL LOW (ref 39.0–52.0)
Hemoglobin: 9.5 g/dL — ABNORMAL LOW (ref 13.0–17.0)
Lymphocytes Relative: 32 % (ref 12–46)
Lymphs Abs: 2.1 10*3/uL (ref 0.7–4.0)
MCH: 30.6 pg (ref 26.0–34.0)
MCHC: 32 g/dL (ref 30.0–36.0)
MCV: 95.8 fL (ref 78.0–100.0)
Monocytes Absolute: 0.8 10*3/uL (ref 0.1–1.0)
Monocytes Relative: 13 % — ABNORMAL HIGH (ref 3–12)
Neutro Abs: 3.3 10*3/uL (ref 1.7–7.7)
Neutrophils Relative %: 50 % (ref 43–77)
Platelets: 146 10*3/uL — ABNORMAL LOW (ref 150–400)
RBC: 3.1 MIL/uL — ABNORMAL LOW (ref 4.22–5.81)
RDW: 14.9 % (ref 11.5–15.5)
WBC: 6.5 10*3/uL (ref 4.0–10.5)

## 2014-06-29 LAB — LACTATE DEHYDROGENASE: LDH: 212 U/L (ref 94–250)

## 2014-06-29 LAB — C-REACTIVE PROTEIN: CRP: <0.5 mg/dL — ABNORMAL LOW (ref <0.60)

## 2014-06-29 MED ORDER — LENALIDOMIDE 5 MG PO CAPS: ORAL_CAPSULE | ORAL | Status: DC

## 2014-06-29 MED ORDER — HEPARIN SOD (PORK) LOCK FLUSH 100 UNIT/ML IV SOLN
INTRAVENOUS | Status: AC
  Filled 2014-06-29: qty 5

## 2014-06-29 MED ORDER — HEPARIN SOD (PORK) LOCK FLUSH 100 UNIT/ML IV SOLN
500.0000 [IU] | Freq: Once | INTRAVENOUS | Status: AC | PRN
  Administered 2014-06-29: 500 [IU]

## 2014-06-29 MED ORDER — SODIUM CHLORIDE 0.9 % IV SOLN
Freq: Once | INTRAVENOUS | Status: AC
  Administered 2014-06-29: 14:00:00 via INTRAVENOUS

## 2014-06-29 MED ORDER — ZOLEDRONIC ACID 4 MG/5ML IV CONC
2.0000 mg | Freq: Once | INTRAVENOUS | Status: AC
  Administered 2014-06-29: 2 mg via INTRAVENOUS
  Filled 2014-06-29: qty 2.5

## 2014-06-29 MED ORDER — SODIUM CHLORIDE 0.9 % IJ SOLN
10.0000 mL | INTRAMUSCULAR | Status: DC | PRN
  Administered 2014-06-29: 10 mL
  Filled 2014-06-29: qty 10

## 2014-06-29 NOTE — Patient Instructions (Signed)
[facility] Discharge Instructions for Patients Receiving Chemotherapy  Today you received Zometa.  Return as scheduled for lab work, infusions, and office visits.   The clinic phone number is [phone]. Office hours are Monday-Friday 8:30am-5:00pm.  BELOW ARE SYMPTOMS THAT SHOULD BE REPORTED IMMEDIATELY:  *FEVER GREATER THAN 101.0 F  *CHILLS WITH OR WITHOUT FEVER  NAUSEA AND VOMITING THAT IS NOT CONTROLLED WITH YOUR NAUSEA MEDICATION  *UNUSUAL SHORTNESS OF BREATH  *UNUSUAL BRUISING OR BLEEDING  TENDERNESS IN MOUTH AND THROAT WITH OR WITHOUT PRESENCE OF ULCERS  *URINARY PROBLEMS  *BOWEL PROBLEMS  UNUSUAL RASH Items with * indicate a potential emergency and should be followed up as soon as possible. If you have an emergency after office hours please contact your primary care physician or go to the nearest emergency department.  Please call the clinic during office hours if you have any questions or concerns.   You may also contact the Patient Navigator at [phone] should you have any questions or need assistance in obtaining follow up care. _____________________________________________________________________ Have you asked about our STAR program?    STAR stands for Survivorship Training and Rehabilitation, and this is a nationally recognized cancer care program that focuses on survivorship and rehabilitation.  Cancer and cancer treatments may cause problems, such as, pain, making you feel tired and keeping you from doing the things that you need or want to do. Cancer rehabilitation can help. Our goal is to reduce these troubling effects and help you have the best quality of life possible.  You may receive a survey from a nurse that asks questions about your current state of health.  Based on the survey results, all eligible patients will be referred to the [facility] program for an evaluation so we can better serve you! A frequently asked questions sheet is  available upon request.

## 2014-06-30 ENCOUNTER — Encounter (HOSPITAL_COMMUNITY): Payer: Managed Care, Other (non HMO)

## 2014-06-30 ENCOUNTER — Other Ambulatory Visit (HOSPITAL_COMMUNITY): Payer: Managed Care, Other (non HMO)

## 2014-06-30 LAB — KAPPA/LAMBDA LIGHT CHAINS
Kappa free light chain: 36.96 mg/L — ABNORMAL HIGH (ref 3.30–19.40)
Kappa, lambda light chain ratio: 1.65 (ref 0.26–1.65)
Lambda free light chains: 22.35 mg/L (ref 5.71–26.30)

## 2014-06-30 LAB — BETA 2 MICROGLOBULIN, SERUM: Beta-2 Microglobulin: 3.7 mg/L — ABNORMAL HIGH (ref 0.6–2.4)

## 2014-07-03 LAB — MULTIPLE MYELOMA PANEL, SERUM
Albumin ELP: 61.2 % (ref 55.8–66.1)
Alpha-1-Globulin: 6.5 % — ABNORMAL HIGH (ref 2.9–4.9)
Alpha-2-Globulin: 12.1 % — ABNORMAL HIGH (ref 7.1–11.8)
Beta 2: 4.8 % (ref 3.2–6.5)
Beta Globulin: 7.4 % — ABNORMAL HIGH (ref 4.7–7.2)
Gamma Globulin: 8 % — ABNORMAL LOW (ref 11.1–18.8)
IgA: 69 mg/dL (ref 68–379)
IgG (Immunoglobin G), Serum: 369 mg/dL — ABNORMAL LOW (ref 650–1600)
IgM, Serum: 40 mg/dL — ABNORMAL LOW (ref 41–251)
M-Spike, %: NOT DETECTED g/dL
Total Protein: 5.4 g/dL — ABNORMAL LOW (ref 6.0–8.3)

## 2014-07-06 ENCOUNTER — Ambulatory Visit (HOSPITAL_COMMUNITY)
Admission: RE | Admit: 2014-07-06 | Discharge: 2014-07-06 | Disposition: A | Discharge status: Home / Self Care | Payer: Managed Care, Other (non HMO) | Source: Ambulatory Visit | Attending: Internal Medicine | Admitting: Internal Medicine

## 2014-07-06 DIAGNOSIS — R935 Abnormal findings on diagnostic imaging of other abdominal regions, including retroperitoneum: Secondary | ICD-10-CM

## 2014-07-07 ENCOUNTER — Other Ambulatory Visit: Payer: Self-pay | Admitting: *Deleted

## 2014-07-07 MED ORDER — ATORVASTATIN CALCIUM 80 MG PO TABS: 80.0000 mg | ORAL_TABLET | Freq: Every day | ORAL | Status: DC

## 2014-07-07 NOTE — Telephone Encounter (Signed)
ATORVASTATIN 80 MG 30 TAB F [id number]

## 2014-07-07 NOTE — Telephone Encounter (Signed)
ESCRIBED RX PER REQUEST

## 2014-07-13 ENCOUNTER — Other Ambulatory Visit (INDEPENDENT_AMBULATORY_CARE_PROVIDER_SITE_OTHER): Payer: Self-pay | Admitting: Internal Medicine

## 2014-07-13 DIAGNOSIS — R935 Abnormal findings on diagnostic imaging of other abdominal regions, including retroperitoneum: Secondary | ICD-10-CM

## 2014-07-27 ENCOUNTER — Other Ambulatory Visit (HOSPITAL_COMMUNITY): Payer: Self-pay | Admitting: Oncology

## 2014-07-27 DIAGNOSIS — C9000 Multiple myeloma not having achieved remission: Secondary | ICD-10-CM

## 2014-07-27 MED ORDER — LENALIDOMIDE 5 MG PO CAPS: ORAL_CAPSULE | ORAL | Status: DC

## 2014-07-28 ENCOUNTER — Ambulatory Visit
Admission: RE | Admit: 2014-07-28 | Discharge: 2014-07-28 | Disposition: A | Discharge status: Home / Self Care | Payer: Managed Care, Other (non HMO) | Source: Ambulatory Visit | Attending: Internal Medicine | Admitting: Internal Medicine

## 2014-07-28 DIAGNOSIS — R935 Abnormal findings on diagnostic imaging of other abdominal regions, including retroperitoneum: Secondary | ICD-10-CM

## 2014-07-28 MED ORDER — GADOBENATE DIMEGLUMINE 529 MG/ML IV SOLN
10.0000 mL | Freq: Once | INTRAVENOUS | Status: AC | PRN
  Administered 2014-07-28: 10 mL via INTRAVENOUS

## 2014-08-02 ENCOUNTER — Telehealth (INDEPENDENT_AMBULATORY_CARE_PROVIDER_SITE_OTHER): Payer: Self-pay | Admitting: *Deleted

## 2014-08-02 DIAGNOSIS — K861 Other chronic pancreatitis: Secondary | ICD-10-CM

## 2014-08-02 NOTE — Telephone Encounter (Signed)
Per Dr.Rehman the patient will need to have labs drawn /stool collected 24 hours after being off Creon 1 week.

## 2014-08-07 ENCOUNTER — Other Ambulatory Visit (INDEPENDENT_AMBULATORY_CARE_PROVIDER_SITE_OTHER): Payer: Self-pay | Admitting: Internal Medicine

## 2014-08-07 ENCOUNTER — Telehealth (INDEPENDENT_AMBULATORY_CARE_PROVIDER_SITE_OTHER): Payer: Self-pay | Admitting: *Deleted

## 2014-08-07 MED ORDER — DICYCLOMINE HCL 10 MG PO CAPS: 10.0000 mg | ORAL_CAPSULE | Freq: Three times a day (TID) | ORAL | Status: DC

## 2014-08-07 NOTE — Telephone Encounter (Signed)
A prescription was sent to the patient's pharmacy for Dicyclomine. The prescription for the Lomotil the patient will need to come by and pick up. Patient was told that he would need to be off the Creon 1 week prior to collecting the Fecal Fat - 24 hour . He plans to come by to lab tomorrow ,08/08/14 , to get container. Patient was also instructed that he would need to take the dicyclomine on an as needed basis for now. Verbalized understanding.

## 2014-08-07 NOTE — Telephone Encounter (Signed)
Prescription for Dicyclomine was e-scribed to the Pharmacy. Patient will need to come by and get his Lomotil.

## 2014-08-07 NOTE — Telephone Encounter (Signed)
Travus would like for Dr. Laural Golden to know there is no improvement since he has stopped the Creon. Depending on how much he eats, a coupl of days 1 to 2 bowels movements. They are sooner after he eats. Not better at all. His return number is [phone].

## 2014-08-10 ENCOUNTER — Encounter (HOSPITAL_COMMUNITY): Payer: Managed Care, Other (non HMO) | Attending: Oncology

## 2014-08-10 ENCOUNTER — Encounter (HOSPITAL_BASED_OUTPATIENT_CLINIC_OR_DEPARTMENT_OTHER): Payer: Managed Care, Other (non HMO) | Admitting: Hematology & Oncology

## 2014-08-10 ENCOUNTER — Encounter (HOSPITAL_COMMUNITY): Payer: Managed Care, Other (non HMO) | Attending: Hematology & Oncology

## 2014-08-10 DIAGNOSIS — C9000 Multiple myeloma not having achieved remission: Secondary | ICD-10-CM

## 2014-08-10 DIAGNOSIS — N189 Chronic kidney disease, unspecified: Secondary | ICD-10-CM | POA: Diagnosis not present

## 2014-08-10 DIAGNOSIS — D649 Anemia, unspecified: Secondary | ICD-10-CM | POA: Diagnosis not present

## 2014-08-10 DIAGNOSIS — E119 Type 2 diabetes mellitus without complications: Secondary | ICD-10-CM

## 2014-08-10 LAB — COMPREHENSIVE METABOLIC PANEL
ALT: 60 U/L — ABNORMAL HIGH (ref 0–53)
AST: 60 U/L — ABNORMAL HIGH (ref 0–37)
Albumin: 3.3 g/dL — ABNORMAL LOW (ref 3.5–5.2)
Alkaline Phosphatase: 81 U/L (ref 39–117)
Anion gap: 5 (ref 5–15)
BUN: 31 mg/dL — ABNORMAL HIGH (ref 6–23)
CO2: 19 mmol/L (ref 19–32)
Calcium: 7.8 mg/dL — ABNORMAL LOW (ref 8.4–10.5)
Chloride: 116 mmol/L — ABNORMAL HIGH (ref 96–112)
Creatinine, Ser: 1.86 mg/dL — ABNORMAL HIGH (ref 0.50–1.35)
GFR calc Af Amer: 45 mL/min — ABNORMAL LOW (ref >90)
GFR calc non Af Amer: 39 mL/min — ABNORMAL LOW (ref >90)
Glucose, Bld: 138 mg/dL — ABNORMAL HIGH (ref 70–99)
Potassium: 3.7 mmol/L (ref 3.5–5.1)
Sodium: 140 mmol/L (ref 135–145)
Total Bilirubin: 0.6 mg/dL (ref 0.3–1.2)
Total Protein: 5.7 g/dL — ABNORMAL LOW (ref 6.0–8.3)

## 2014-08-10 LAB — CBC WITH DIFFERENTIAL/PLATELET
Basophils Absolute: 0 10*3/uL (ref 0.0–0.1)
Basophils Relative: 0 % (ref 0–1)
Eosinophils Absolute: 0.3 10*3/uL (ref 0.0–0.7)
Eosinophils Relative: 5 % (ref 0–5)
HCT: 27.2 % — ABNORMAL LOW (ref 39.0–52.0)
Hemoglobin: 8.8 g/dL — ABNORMAL LOW (ref 13.0–17.0)
Lymphocytes Relative: 33 % (ref 12–46)
Lymphs Abs: 1.8 10*3/uL (ref 0.7–4.0)
MCH: 31.2 pg (ref 26.0–34.0)
MCHC: 32.4 g/dL (ref 30.0–36.0)
MCV: 96.5 fL (ref 78.0–100.0)
Monocytes Absolute: 0.7 10*3/uL (ref 0.1–1.0)
Monocytes Relative: 12 % (ref 3–12)
Neutro Abs: 2.6 10*3/uL (ref 1.7–7.7)
Neutrophils Relative %: 50 % (ref 43–77)
Platelets: 152 10*3/uL (ref 150–400)
RBC: 2.82 MIL/uL — ABNORMAL LOW (ref 4.22–5.81)
RDW: 15.1 % (ref 11.5–15.5)
WBC: 5.4 10*3/uL (ref 4.0–10.5)

## 2014-08-10 LAB — LACTATE DEHYDROGENASE: LDH: 231 U/L (ref 94–250)

## 2014-08-10 MED ORDER — HEPARIN SOD (PORK) LOCK FLUSH 100 UNIT/ML IV SOLN
500.0000 [IU] | Freq: Once | INTRAVENOUS | Status: AC
  Administered 2014-08-10: 500 [IU] via INTRAVENOUS

## 2014-08-10 MED ORDER — HEPARIN SOD (PORK) LOCK FLUSH 100 UNIT/ML IV SOLN
INTRAVENOUS | Status: AC
  Filled 2014-08-10: qty 5

## 2014-08-10 MED ORDER — SODIUM CHLORIDE 0.9 % IV SOLN
Freq: Once | INTRAVENOUS | Status: AC
  Administered 2014-08-10: 15:00:00 via INTRAVENOUS

## 2014-08-10 MED ORDER — ZOLEDRONIC ACID 4 MG/5ML IV CONC
3.0000 mg | Freq: Once | INTRAVENOUS | Status: AC
  Administered 2014-08-10: 3 mg via INTRAVENOUS
  Filled 2014-08-10: qty 3.75

## 2014-08-10 NOTE — Progress Notes (Signed)
Tolerated well

## 2014-08-10 NOTE — Progress Notes (Signed)
Franklin Font, MD [address] [place] [address]    DIAGNOSIS: Myeloma, kappa light chain, Stage II disease (no evidence of monoclonal protein in   serum)             Evaluation at [facility] for transplant, felt not to be a good transplant candidate   Stage III CKD   3 g proteinuria at diagnosis, Bence-Jones proteins    BMBX at diagnosis with 19% plasma cells, kappa restricted   CURRENT THERAPY: Lenalidomide 5 mg 7 days on and 7 days off with 20 mg Dexamethasone BID every Friday only. Zometa  2 mg every 4 weeks.  INTERVAL HISTORY: [name] 54 y.o. male returns for follow-up of myeloma. He takes his [name] as prescribed. He has multiple nonspecific complaints such as intermittent nosebleeds, intermittent toothaches, and numbness in his feet that is chronic. Visit times he gets night sweats but they are rare. He is on Cymbalta and aspirin secondary to cardiac issues and he states he knows this is the cause of his intermittent nose bleeding. He has had problems with his bowels and also with digestion. He reports he is been changed to Bentyl.  He has multiple medical issues including obesity, coronary artery disease status post an MI and stent placement, insulin-dependent diabetes and renal insufficiency.  MEDICAL HISTORY: Past Medical History  Diagnosis Date  . Arteriosclerotic cardiovascular disease (ASCVD)     MI-2000s; stent to the proximal LAD and diagonal in 2001; stress nuclear in 2008-impaired exercise capacity, left ventricular dilatation, moderately to severely depressed EF, apical, inferior and anteroseptal scar  . Hypertension   . 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 Diabetes mellitus     Insulin  . GERD (gastroesophageal reflux disease)   . Pedal edema     Venous insufficiency  . Obesity   . Hyperlipidemia   . Injection site reaction   . Cellulitis of leg     both legs  . Chronic kidney disease, stage 3, mod decreased GFR     Creatinine of 1.84 in  06/2011 and 1.5 in 07/2011  . Chronic diarrhea   . Sleep apnea     uses cpap  . Ulcer   . Atrial flutter   . Multiple myeloma 07/01/2011  . Myocardial infarction 2000  . Gout     has DIABETES MELLITUS, TYPE II, ON INSULIN; Morbid obesity; SLEEP APNEA; Multiple myeloma; Arteriosclerotic cardiovascular disease (ASCVD); Hypertension; Hyperlipidemia; DDD (degenerative disc disease), cervical; Atrial flutter; Chronic pancreatitis; Anemia, normocytic normochromic; and Chronic systolic heart failure on his problem list.     has No Known Allergies.  [name] does not currently have medications on file.  SURGICAL HISTORY: Past Surgical History  Procedure Laterality Date  . Laparoscopic gastric banding  2006    has been removed  . Wrist surgery      Left; removal of bone fragment  . Incision and drainage abscess anal    . Abscess drainage      Scrotal  . Bone marrow biopsy  05/13/11  . Portacath placement  07/07/2011    Procedure: INSERTION PORT-A-CATH;  Surgeon: Scherry Ran, MD;  Location: [facility];  Service: General;  Laterality: N/A;  . Colonoscopy  11/28/2011    Procedure: COLONOSCOPY;  Surgeon: Rogene Houston, MD;  Location: [facility];  Service: Endoscopy;  Laterality: N/A;  930  . Esophagogastroduodenoscopy  01/02/2012    Procedure: ESOPHAGOGASTRODUODENOSCOPY (EGD);  Surgeon: Rogene Houston, MD;  Location:  [facility];  Service: Endoscopy;  Laterality: N/A;  100  . Esophageal biopsy  01/02/2012    Procedure: BIOPSY;  Surgeon: Rogene Houston, MD;  Location: [facility];  Service: Endoscopy;  Laterality: N/A;  . Esophagogastroduodenoscopy N/A 09/20/2012    Procedure: ESOPHAGOGASTRODUODENOSCOPY (EGD);  Surgeon: Rogene Houston, MD;  Location: [facility];  Service: Endoscopy;  Laterality: N/A;  . Eus N/A 10/07/2012    Procedure: UPPER ENDOSCOPIC ULTRASOUND (EUS) LINEAR;  Surgeon: Milus Banister, MD;  Location: [facility];  Service: Endoscopy;  Laterality: N/A;  .  Cardioversion N/A 10/13/2012    Procedure: CARDIOVERSION;  Surgeon: Yehuda Savannah, MD;  Location: [facility];  Service: Cardiovascular;  Laterality: N/A;  . Cardiac catheterization      cardiac stent  . Port-a-cath removal Left 12/07/2012    Procedure: REMOVAL PORT-A-CATH;  Surgeon: Scherry Ran, MD;  Location: [facility];  Service: General;  Laterality: Left;  . Portacath placement N/A 12/07/2012    Procedure: INSERTION PORT-A-CATH;  Surgeon: Scherry Ran, MD;  Location: [facility];  Service: General;  Laterality: N/A;  Attempted portacath placement on left and right side  . Cataract extraction w/phaco Left 02/13/2014    Procedure: CATARACT EXTRACTION PHACO AND INTRAOCULAR LENS PLACEMENT (IOC);  Surgeon: Tonny Branch, MD;  Location: [facility];  Service: Ophthalmology;  Laterality: Left;  CDE:  7.67  . Cataract extraction w/phaco Right 03/02/2014    Procedure: CATARACT EXTRACTION PHACO AND INTRAOCULAR LENS PLACEMENT RIGHT EYE CDE=16.81;  Surgeon: Tonny Branch, MD;  Location: [facility];  Service: Ophthalmology;  Laterality: Right;    SOCIAL HISTORY: History   Social History  . Marital Status: Married    Spouse Name: N/A  . Number of Children: N/A  . Years of Education: 16   Occupational History  . Nurse, learning disability   . Funeral Director    Social History Main Topics  . Smoking status: Former Smoker -- 0.25 packs/day for 1 years    Types: Cigarettes, Cigars    Quit date: 05/17/2001  . Smokeless tobacco: Never Used  . Alcohol Use: No  . Drug Use: No  . Sexual Activity: Yes    Birth Control/ Protection: None   Other Topics Concern  . Not on file   Social History Narrative    FAMILY HISTORY: Family History  Problem Relation Age of Onset  . Heart disease Mother   . Cancer Mother   . Diabetes Father   . Anesthesia problems Neg Hx   . Hypotension Neg Hx   . Malignant hyperthermia Neg Hx   . Pseudochol deficiency Neg Hx   . Arthritis      Review of Systems  Constitutional: Negative.    HENT: Negative.        Occasional toothache  Eyes: Negative.   Respiratory: Positive for shortness of breath.   Cardiovascular: Negative.   Gastrointestinal: Negative.   Genitourinary: Negative.   Musculoskeletal: Positive for joint pain.  Skin: Negative.   Neurological: Positive for tingling.       Numbness in feet, chronic   Endo/Heme/Allergies:       Rare nosebleed  Psychiatric/Behavioral: Negative.     PHYSICAL EXAMINATION  ECOG PERFORMANCE STATUS: 1 - Symptomatic but completely ambulatory  Filed Vitals:   08/10/14 1349  BP: 109/53  Pulse: 51  Temp: 99 F (37.2 C)  Resp: 20    Physical Exam  Constitutional: He is oriented to person, place, and time and well-developed, well-nourished, and in no  distress.  HENT:  Head: Normocephalic and atraumatic.  Nose: Nose normal.  Mouth/Throat: Oropharynx is clear and moist. No oropharyngeal exudate.  Eyes: Conjunctivae and EOM are normal. Pupils are equal, round, and reactive to light. Right eye exhibits no discharge. Left eye exhibits no discharge. No scleral icterus.  Neck: Normal range of motion. Neck supple. No tracheal deviation present. No thyromegaly present.  Cardiovascular: Normal rate, regular rhythm and normal heart sounds.  Exam reveals no gallop and no friction rub.   No murmur heard. Pulmonary/Chest: Effort normal and breath sounds normal. He has no wheezes. He has no rales.  Abdominal: Soft. Bowel sounds are normal. He exhibits no distension and no mass. There is no tenderness. There is no rebound and no guarding.  Musculoskeletal: Normal range of motion. He exhibits no edema.  Lymphadenopathy:    He has no cervical adenopathy.  Neurological: He is alert and oriented to person, place, and time. He has normal reflexes. No cranial nerve deficit. Gait normal. Coordination normal.  Skin: Skin is warm and dry. No rash noted.  Psychiatric: Mood, memory, affect and judgment normal.  Nursing note and vitals  reviewed.   LABORATORY DATA:  CBC    Component Value Date/Time   WBC 6.5 06/29/2014 1356   RBC 3.10* 06/29/2014 1356   HGB 9.5* 06/29/2014 1356   HCT 29.7* 06/29/2014 1356   PLT 146* 06/29/2014 1356   MCV 95.8 06/29/2014 1356   MCH 30.6 06/29/2014 1356   MCHC 32.0 06/29/2014 1356   RDW 14.9 06/29/2014 1356   LYMPHSABS 2.1 06/29/2014 1356   MONOABS 0.8 06/29/2014 1356   EOSABS 0.3 06/29/2014 1356   BASOSABS 0.0 06/29/2014 1356   CMP     Component Value Date/Time   NA 141 06/29/2014 1356   K 3.8 06/29/2014 1356   CL 111 06/29/2014 1356   CO2 23 06/29/2014 1356   GLUCOSE 131* 06/29/2014 1356   BUN 34* 06/29/2014 1356   CREATININE 1.77* 06/29/2014 1356   CREATININE 2.24* 10/05/2012 1411   CALCIUM 8.4 06/29/2014 1356   CALCIUM 6.8* 08/15/2013 1217   PROT 5.6* 06/29/2014 1356   PROT 5.4* 06/29/2014 1356   ALBUMIN 3.3* 06/29/2014 1356   AST 40* 06/29/2014 1356   ALT 79* 06/29/2014 1356   ALKPHOS 94 06/29/2014 1356   BILITOT 0.5 06/29/2014 1356   GFRNONAA 42* 06/29/2014 1356   GFRAA 48* 06/29/2014 1356      ASSESSMENT and THERAPY PLAN:   Multiple myeloma, light chain disease  He is to continue on his [name] as prescribed. His disease seems to be well controlled. He has had an evaluation in the past for transplant and was deemed not to be a good candidate. He has an underlying anemia which I suspect is multifactorial including from his chronic kidney disease and most likely ongoing use of [name]. His light chain ratio is currently normal. I therefore do not feel a need to make a change in his treatment at this point nor pursue a bone marrow biopsy.  He has been on Zometa for many years. It is currently being given every 6 weeks. I discussed with him changing his dosing to every 3 months. I advised him there is not good data on how to use zometa after patients have been on it for several years. He is open to re-addressing this at follow-up.  He likes to be called  with his laboratory results. I discussed this with [name], he states the patient tends to become noncompliant  when he is called with his laboratory studies.  Based on his calcium level today I have encouraged him to make sure he is compliant with his oral calcium. We will see him back in several months with repeat laboratory studies.  All questions were answered. The patient knows to call the clinic with any problems, questions or concerns. We can certainly see the patient much sooner if necessary. This note was electronically signed. Molli Hazard 08/10/2014

## 2014-08-10 NOTE — Patient Instructions (Addendum)
[place] at [facility] Discharge Instructions  RECOMMENDATIONS MADE BY THE CONSULTANT AND ANY TEST RESULTS WILL BE SENT TO YOUR REFERRING PHYSICIAN. You saw Dr Whitney Muse today.  Labs in 6 weeks.  Follow up with the doctor in 3 months.  zometa as scheduled.  Please call the clinic if you have any questions or concerns   Thank you for choosing [place] at [facility] to provide your oncology and hematology care.  To afford each patient quality time with our provider, please arrive at least 15 minutes before your scheduled appointment time.    You need to re-schedule your appointment should you arrive 10 or more minutes late.  We strive to give you quality time with our providers, and arriving late affects you and other patients whose appointments are after yours.  Also, if you no show three or more times for appointments you may be dismissed from the clinic at the providers discretion.     Again, thank you for choosing [facility].  Our hope is that these requests will decrease the amount of time that you wait before being seen by our physicians.       _____________________________________________________________  Should you have questions after your visit to [facility], please contact our office at [phone] between the hours of 8:30 a.m. and 4:30 p.m.  Voicemails left after 4:30 p.m. will not be returned until the following business day.  For prescription refill requests, have your pharmacy contact our office.

## 2014-08-10 NOTE — Progress Notes (Signed)
Labs drawn

## 2014-08-11 ENCOUNTER — Other Ambulatory Visit (HOSPITAL_COMMUNITY): Payer: Managed Care, Other (non HMO)

## 2014-08-11 ENCOUNTER — Encounter (HOSPITAL_COMMUNITY): Payer: Managed Care, Other (non HMO)

## 2014-08-11 LAB — BETA 2 MICROGLOBULIN, SERUM: Beta-2 Microglobulin: 3.6 mg/L — ABNORMAL HIGH (ref 0.6–2.4)

## 2014-08-11 LAB — KAPPA/LAMBDA LIGHT CHAINS
Kappa free light chain: 33.91 mg/L — ABNORMAL HIGH (ref 3.30–19.40)
Kappa, lambda light chain ratio: 1.28 (ref 0.26–1.65)
Lambda free light chains: 26.58 mg/L — ABNORMAL HIGH (ref 5.71–26.30)

## 2014-08-11 LAB — C-REACTIVE PROTEIN: CRP: <0.5 mg/dL — ABNORMAL LOW (ref <0.60)

## 2014-08-15 LAB — MULTIPLE MYELOMA PANEL, SERUM
Albumin ELP: 62.1 % (ref 55.8–66.1)
Alpha-1-Globulin: 6.2 % — ABNORMAL HIGH (ref 2.9–4.9)
Alpha-2-Globulin: 11.9 % — ABNORMAL HIGH (ref 7.1–11.8)
Beta 2: 4.9 % (ref 3.2–6.5)
Beta Globulin: 7 % (ref 4.7–7.2)
Gamma Globulin: 7.9 % — ABNORMAL LOW (ref 11.1–18.8)
IgA: 71 mg/dL (ref 68–379)
IgG (Immunoglobin G), Serum: 449 mg/dL — ABNORMAL LOW (ref 650–1600)
IgM, Serum: 42 mg/dL — ABNORMAL LOW (ref 41–251)
M-Spike, %: NOT DETECTED g/dL
Total Protein: 5.1 g/dL — ABNORMAL LOW (ref 6.0–8.3)

## 2014-08-20 ENCOUNTER — Encounter (HOSPITAL_COMMUNITY): Payer: Self-pay | Admitting: Hematology & Oncology

## 2014-08-27 ENCOUNTER — Other Ambulatory Visit (HOSPITAL_COMMUNITY): Payer: Self-pay | Admitting: Oncology

## 2014-08-28 ENCOUNTER — Other Ambulatory Visit (HOSPITAL_COMMUNITY): Payer: Self-pay | Admitting: Oncology

## 2014-08-28 ENCOUNTER — Other Ambulatory Visit (INDEPENDENT_AMBULATORY_CARE_PROVIDER_SITE_OTHER): Payer: Self-pay | Admitting: Internal Medicine

## 2014-08-28 DIAGNOSIS — C9000 Multiple myeloma not having achieved remission: Secondary | ICD-10-CM

## 2014-08-28 MED ORDER — LENALIDOMIDE 5 MG PO CAPS: ORAL_CAPSULE | ORAL | Status: DC

## 2014-08-30 ENCOUNTER — Telehealth (HOSPITAL_COMMUNITY): Payer: Self-pay | Admitting: *Deleted

## 2014-08-30 NOTE — Telephone Encounter (Signed)
I talked to patient re: his anemia and in our conversation he brought up that he has muscle cramps. He states they affect his legs and mainly his hands. He said that he was told in the past that his magnesium was low and that he thinks he was supposed to ask [place] if he could take magnesium(in reference to his kidneys?) We do not have a magnesium in the system since 2013.  I told pt. that I would talk with Dr. Whitney Muse and ask her thoughts on causes of these cramps and what he should do about them.

## 2014-09-07 ENCOUNTER — Other Ambulatory Visit (HOSPITAL_COMMUNITY): Payer: Self-pay | Admitting: Hematology & Oncology

## 2014-09-07 ENCOUNTER — Encounter (HOSPITAL_COMMUNITY): Payer: Managed Care, Other (non HMO)

## 2014-09-07 ENCOUNTER — Telehealth (HOSPITAL_COMMUNITY): Payer: Self-pay | Admitting: Oncology

## 2014-09-07 ENCOUNTER — Encounter (HOSPITAL_COMMUNITY): Payer: Managed Care, Other (non HMO) | Attending: Oncology

## 2014-09-07 ENCOUNTER — Encounter (HOSPITAL_COMMUNITY): Payer: Self-pay

## 2014-09-07 DIAGNOSIS — C9000 Multiple myeloma not having achieved remission: Secondary | ICD-10-CM

## 2014-09-07 LAB — CBC WITH DIFFERENTIAL/PLATELET
Basophils Absolute: 0 10*3/uL (ref 0.0–0.1)
Basophils Relative: 0 % (ref 0–1)
Eosinophils Absolute: 0.3 10*3/uL (ref 0.0–0.7)
Eosinophils Relative: 5 % (ref 0–5)
HCT: 29.2 % — ABNORMAL LOW (ref 39.0–52.0)
Hemoglobin: 9.5 g/dL — ABNORMAL LOW (ref 13.0–17.0)
Lymphocytes Relative: 31 % (ref 12–46)
Lymphs Abs: 2 10*3/uL (ref 0.7–4.0)
MCH: 31.1 pg (ref 26.0–34.0)
MCHC: 32.5 g/dL (ref 30.0–36.0)
MCV: 95.7 fL (ref 78.0–100.0)
Monocytes Absolute: 0.7 10*3/uL (ref 0.1–1.0)
Monocytes Relative: 11 % (ref 3–12)
Neutro Abs: 3.4 10*3/uL (ref 1.7–7.7)
Neutrophils Relative %: 53 % (ref 43–77)
Platelets: 169 10*3/uL (ref 150–400)
RBC: 3.05 MIL/uL — ABNORMAL LOW (ref 4.22–5.81)
RDW: 15.2 % (ref 11.5–15.5)
WBC: 6.3 10*3/uL (ref 4.0–10.5)

## 2014-09-07 LAB — COMPREHENSIVE METABOLIC PANEL
ALT: 78 U/L — ABNORMAL HIGH (ref 0–53)
AST: 56 U/L — ABNORMAL HIGH (ref 0–37)
Albumin: 3.1 g/dL — ABNORMAL LOW (ref 3.5–5.2)
Alkaline Phosphatase: 94 U/L (ref 39–117)
Anion gap: 7 (ref 5–15)
BUN: 29 mg/dL — ABNORMAL HIGH (ref 6–23)
CO2: 21 mmol/L (ref 19–32)
Calcium: 8.1 mg/dL — ABNORMAL LOW (ref 8.4–10.5)
Chloride: 111 mmol/L (ref 96–112)
Creatinine, Ser: 1.97 mg/dL — ABNORMAL HIGH (ref 0.50–1.35)
GFR calc Af Amer: 42 mL/min — ABNORMAL LOW (ref >90)
GFR calc non Af Amer: 36 mL/min — ABNORMAL LOW (ref >90)
Glucose, Bld: 144 mg/dL — ABNORMAL HIGH (ref 70–99)
Potassium: 3.7 mmol/L (ref 3.5–5.1)
Sodium: 139 mmol/L (ref 135–145)
Total Bilirubin: 0.4 mg/dL (ref 0.3–1.2)
Total Protein: 5.5 g/dL — ABNORMAL LOW (ref 6.0–8.3)

## 2014-09-07 LAB — LACTATE DEHYDROGENASE: LDH: 246 U/L (ref 94–250)

## 2014-09-07 LAB — MAGNESIUM: Magnesium: 1.8 mg/dL (ref 1.5–2.5)

## 2014-09-07 MED ORDER — HEPARIN SOD (PORK) LOCK FLUSH 100 UNIT/ML IV SOLN
INTRAVENOUS | Status: AC
  Filled 2014-09-07: qty 5

## 2014-09-07 MED ORDER — HYDROCODONE-ACETAMINOPHEN 5-325 MG PO TABS: 1.0000 | ORAL_TABLET | ORAL | Status: DC | PRN

## 2014-09-07 MED ORDER — SODIUM CHLORIDE 0.9 % IV SOLN
2.0000 mg | Freq: Once | INTRAVENOUS | Status: AC
  Administered 2014-09-07: 2 mg via INTRAVENOUS
  Filled 2014-09-07: qty 2.5

## 2014-09-07 MED ORDER — HEPARIN SOD (PORK) LOCK FLUSH 100 UNIT/ML IV SOLN
500.0000 [IU] | Freq: Once | INTRAVENOUS | Status: AC | PRN
  Administered 2014-09-07: 500 [IU]

## 2014-09-07 MED ORDER — SODIUM CHLORIDE 0.9 % IJ SOLN: 10.0000 mL | INTRAMUSCULAR | Status: DC | PRN

## 2014-09-07 MED ORDER — SODIUM CHLORIDE 0.9 % IV SOLN
INTRAVENOUS | Status: DC
  Administered 2014-09-07: 15:00:00 via INTRAVENOUS

## 2014-09-07 NOTE — Patient Instructions (Addendum)
[name] at [facility] Discharge Instructions  RECOMMENDATIONS MADE BY THE CONSULTANT AND ANY TEST RESULTS WILL BE SENT TO YOUR REFERRING PHYSICIAN.  1.  Zometa infusion today. 2.  MD appointment with [name] next Tuesday. 3.  Return in June for lab work,zometa and MD appointment. 4.  Increase your Os-Cal to 1800mg  twice daily (3 tabs, twice daily) until you see T. Kefalas, PA-C  5.  Begin over the counter "Slow Mag" x 1 tablet daily until you see T. Kefalas, PA-C next week. 6.  You were given a hydrocodone prescription to help with you pain until you see T. Kefalas, PA-C next week - take as prescribed.  We are waiting for your labwork to result, and are adjusting your medications until you can follow-up with T. Kefalas, PA-C in office next week.  Please present to the ER or contact your PCP sooner if the pain worsens or changes in nature.  Thank you for choosing [place] [facility] to provide your oncology and hematology care.  To afford each patient quality time with our provider, please arrive at least 15 minutes before your scheduled appointment time.    You need to re-schedule your appointment should you arrive 10 or more minutes late.  We strive to give you quality time with our providers, and arriving late affects you and other patients whose appointments are after yours.  Also, if you no show three or more times for appointments you may be dismissed from the clinic at the providers discretion.     Again, thank you for choosing [facility].  Our hope is that these requests will decrease the amount of time that you wait before being seen by our physicians.       _____________________________________________________________  Should you have questions after your visit to [facility], please contact our office at [phone] between the hours of 8:30 a.m. and 4:30 p.m.  Voicemails left after 4:30 p.m. will not be  returned until the following business day.  For prescription refill requests, have your pharmacy contact our office.

## 2014-09-07 NOTE — Progress Notes (Signed)
Tolerated infusion well. 

## 2014-09-07 NOTE — Progress Notes (Addendum)
Called to patient's room by treating RN re service recovery opportunity.  [name] reports continued pain that he states is worsening in his hands with contractures at times.  Labs are pending and Mg++ level has been added.  Per Dr. Whitney Muse, patient is very mildly hypocalcemic and needs to increase his OsCal to 1800mg  BID until seen in clinic next week.  Patient also advised to add Slow Mag x 1 tablet daily and begin taking hydrocodone 5/325 as prescribed until seen in office next week as well - patient verbalized understanding of all instruction.  With his 09/12/14 appointment T. Sheldon Silvan, PA-C will re-evaluate efficacy of OsCal, Mag, and hydrocodone regimen.

## 2014-09-07 NOTE — Telephone Encounter (Signed)
Created in error

## 2014-09-08 LAB — KAPPA/LAMBDA LIGHT CHAINS
Kappa free light chain: 30.63 mg/L — ABNORMAL HIGH (ref 3.30–19.40)
Kappa, lambda light chain ratio: 1.29 (ref 0.26–1.65)
Lambda free light chains: 23.82 mg/L (ref 5.71–26.30)

## 2014-09-08 LAB — BETA 2 MICROGLOBULIN, SERUM: Beta-2 Microglobulin: 3.5 mg/L — ABNORMAL HIGH (ref 0.6–2.4)

## 2014-09-08 LAB — C-REACTIVE PROTEIN: CRP: <0.5 mg/dL — ABNORMAL LOW (ref <0.60)

## 2014-09-11 LAB — MULTIPLE MYELOMA PANEL, SERUM
Albumin SerPl Elph-Mcnc: 2.8 g/dL — ABNORMAL LOW (ref 3.2–5.6)
Albumin/Glob SerPl: 1.5 (ref 0.7–2.0)
Alpha 1: 0.2 g/dL (ref 0.1–0.4)
Alpha2 Glob SerPl Elph-Mcnc: 0.6 g/dL (ref 0.4–1.2)
B-Globulin SerPl Elph-Mcnc: 0.8 g/dL (ref 0.6–1.3)
Gamma Glob SerPl Elph-Mcnc: 0.4 g/dL — ABNORMAL LOW (ref 0.5–1.6)
Globulin, Total: 2 g/dL (ref 2.0–4.5)
IgA: 60 mg/dL — ABNORMAL LOW (ref 90–386)
IgG (Immunoglobin G), Serum: 375 mg/dL — ABNORMAL LOW (ref 700–1600)
IgM, Serum: 47 mg/dL (ref 20–172)
Total Protein ELP: 4.8 g/dL — ABNORMAL LOW (ref 6.0–8.5)

## 2014-09-12 ENCOUNTER — Encounter (HOSPITAL_BASED_OUTPATIENT_CLINIC_OR_DEPARTMENT_OTHER): Payer: Managed Care, Other (non HMO) | Admitting: Oncology

## 2014-09-12 ENCOUNTER — Encounter (HOSPITAL_COMMUNITY): Payer: Self-pay | Admitting: Oncology

## 2014-09-12 VITALS — BP 103/68 | HR 55 | Temp 98.5°F | Resp 16 | Wt 340.7 lb

## 2014-09-12 DIAGNOSIS — C9000 Multiple myeloma not having achieved remission: Secondary | ICD-10-CM | POA: Diagnosis not present

## 2014-09-12 NOTE — Assessment & Plan Note (Signed)
Peripherally, very stable on maintenance [name], however with recent Hgb changes.  He is agreeable to bone marrow aspiration and biopsy to evaluate disease status.  This will be performed by IR due to patient's body habitus.  Order placed.   His hand cramping is improved on oral magnesium.  I have recommended he continue with magnesium.  I also recommended tonic water with quinine if cramping recurs.   Return in 3-4 weeks for follow-up and to review bone marrow aspiration and biopsy results.

## 2014-09-12 NOTE — Patient Instructions (Signed)
[name] at [facility] Discharge Instructions  RECOMMENDATIONS MADE BY THE CONSULTANT AND ANY TEST RESULTS WILL BE SENT TO YOUR REFERRING PHYSICIAN.  Discussion by Robynn Pane, PA-C Try drinking Tonic Water with Quinine to help with the cramping in your hands. Will get you scheduled for a Bone Marrow Biopsy and Aspiration.  It will be done in [place].  They will call you with the date and time of your procedure.   We will see you back a couple of weeks after the biopsy.  Thank you for choosing [place] [facility] to provide your oncology and hematology care.  To afford each patient quality time with our provider, please arrive at least 15 minutes before your scheduled appointment time.    You need to re-schedule your appointment should you arrive 10 or more minutes late.  We strive to give you quality time with our providers, and arriving late affects you and other patients whose appointments are after yours.  Also, if you no show three or more times for appointments you may be dismissed from the clinic at the providers discretion.     Again, thank you for choosing [facility].  Our hope is that these requests will decrease the amount of time that you wait before being seen by our physicians.       _____________________________________________________________  Should you have questions after your visit to [facility], please contact our office at [phone] between the hours of 8:30 a.m. and 4:30 p.m.  Voicemails left after 4:30 p.m. will not be returned until the following business day.  For prescription refill requests, have your pharmacy contact our office.

## 2014-09-12 NOTE — Progress Notes (Signed)
[name], MD [address] [place] [address]  Multiple myeloma - Plan: CT Biopsy  CURRENT THERAPY: Lenalidomide 5 mg 7 days on and 7 days off with 20 mg Dexamethasone BID every Friday only. Zometa 2 mg every 4 weeks.  INTERVAL HISTORY: [name] 54 y.o. male returns for followup of multiple myeloma, kappa light chain, Stage II disease (no evidence of monoclonal protein in serum.  BMBX at time of diagnosis demonstrated 19% plasma cells, kappa restricted.  Previously evaluated at [facility] transplant and not felt to be a good candidate.   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  Cason is seen for some newer issues today that have been plaguing his of late.  He notes cramping of is fingers.  Labs were performed to evaluate this issue. I personally reviewed and went over laboratory results with the patient.  The results are noted within this dictation.  Mag, Phos, and K+ are WNL.  With this being said, he was started on Magnesium.  He reports that his cramping has improved since starting magnesium PO and he is encouraged to continue with this medication.  "What's the status of my cancer."  Peripherally, his labs are solid from a MM standpoint.  However, he has had a change in his Hgb and there are a few things that could cause that ([name], MM, Renal insufficiency, etc).  He is educated that the best way to evaluate his MM is to perform a bone marrow aspiration and biopsy which is reasonable since he has not had one in years.  He is agreeable to this as he is interested in his disease status.   Hematologically, he otherwise denies any complaints and ROS questioning is negative.   Past Medical History  Diagnosis Date  . Arteriosclerotic cardiovascular disease (ASCVD)     MI-2000s; stent to the proximal LAD and diagonal in 2001; stress nuclear in 2008-impaired exercise capacity, left ventricular dilatation, moderately to severely depressed  EF, apical, inferior and anteroseptal scar  . Hypertension   . 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 Diabetes mellitus     Insulin  . GERD (gastroesophageal reflux disease)   . Pedal edema     Venous insufficiency  . Obesity   . Hyperlipidemia   . Injection site reaction   . Cellulitis of leg     both legs  . Chronic kidney disease, stage 3, mod decreased GFR     Creatinine of 1.84 in 06/2011 and 1.5 in 07/2011  . Chronic diarrhea   . Sleep apnea     uses cpap  . Ulcer   . Atrial flutter   . Multiple myeloma 07/01/2011  . Myocardial infarction 2000  . Gout     has DIABETES MELLITUS, TYPE II, ON INSULIN; Morbid obesity; SLEEP APNEA; Multiple myeloma; Arteriosclerotic cardiovascular disease (ASCVD); Hypertension; Hyperlipidemia; DDD (degenerative disc disease), cervical; Atrial flutter; Chronic pancreatitis; Anemia, normocytic normochromic; and Chronic systolic heart failure on his problem list.     has No Known Allergies.  Mr. Sokolowski does not currently have medications on file.  Past Surgical History  Procedure Laterality Date  . Laparoscopic gastric banding  2006    has been removed  . Wrist surgery      Left; removal of bone fragment  . Incision and drainage abscess anal    . Abscess drainage      Scrotal  . Bone marrow biopsy  05/13/11  .  Portacath placement  07/07/2011    Procedure: INSERTION PORT-A-CATH;  Surgeon: Scherry Ran, MD;  Location: [facility];  Service: General;  Laterality: N/A;  . Colonoscopy  11/28/2011    Procedure: COLONOSCOPY;  Surgeon: Rogene Houston, MD;  Location: [facility];  Service: Endoscopy;  Laterality: N/A;  930  . Esophagogastroduodenoscopy  01/02/2012    Procedure: ESOPHAGOGASTRODUODENOSCOPY (EGD);  Surgeon: Rogene Houston, MD;  Location: [facility];  Service: Endoscopy;  Laterality: N/A;  100  . Esophageal biopsy  01/02/2012    Procedure: BIOPSY;  Surgeon: Rogene Houston, MD;  Location: [facility];  Service: Endoscopy;   Laterality: N/A;  . Esophagogastroduodenoscopy N/A 09/20/2012    Procedure: ESOPHAGOGASTRODUODENOSCOPY (EGD);  Surgeon: Rogene Houston, MD;  Location: [facility];  Service: Endoscopy;  Laterality: N/A;  . Eus N/A 10/07/2012    Procedure: UPPER ENDOSCOPIC ULTRASOUND (EUS) LINEAR;  Surgeon: Milus Banister, MD;  Location: [facility];  Service: Endoscopy;  Laterality: N/A;  . Cardioversion N/A 10/13/2012    Procedure: CARDIOVERSION;  Surgeon: Yehuda Savannah, MD;  Location: [facility];  Service: Cardiovascular;  Laterality: N/A;  . Cardiac catheterization      cardiac stent  . Port-a-cath removal Left 12/07/2012    Procedure: REMOVAL PORT-A-CATH;  Surgeon: Scherry Ran, MD;  Location: [facility];  Service: General;  Laterality: Left;  . Portacath placement N/A 12/07/2012    Procedure: INSERTION PORT-A-CATH;  Surgeon: Scherry Ran, MD;  Location: [facility];  Service: General;  Laterality: N/A;  Attempted portacath placement on left and right side  . Cataract extraction w/phaco Left 02/13/2014    Procedure: CATARACT EXTRACTION PHACO AND INTRAOCULAR LENS PLACEMENT (IOC);  Surgeon: Tonny Branch, MD;  Location: [facility];  Service: Ophthalmology;  Laterality: Left;  CDE:  7.67  . Cataract extraction w/phaco Right 03/02/2014    Procedure: CATARACT EXTRACTION PHACO AND INTRAOCULAR LENS PLACEMENT RIGHT EYE CDE=16.81;  Surgeon: Tonny Branch, MD;  Location: [facility];  Service: Ophthalmology;  Laterality: Right;    Denies any headaches, dizziness, double vision, fevers, chills, night sweats, nausea, vomiting, diarrhea, constipation, chest pain, heart palpitations, shortness of breath, blood in stool, black tarry stool, urinary pain, urinary burning, urinary frequency, hematuria.   PHYSICAL EXAMINATION  ECOG PERFORMANCE STATUS: 0 - Asymptomatic  Filed Vitals:   09/12/14 1500  BP: 103/68  Pulse: 55  Temp: 98.5 F (36.9 C)  Resp: 16    GENERAL:alert, no distress, well nourished, well developed, comfortable,  cooperative, obese and smiling SKIN: skin color, texture, turgor are normal, no rashes or significant lesions HEAD: Normocephalic, No masses, lesions, tenderness or abnormalities EYES: normal, PERRLA, EOMI, Conjunctiva are pink and non-injected EARS: External ears normal OROPHARYNX:lips, buccal mucosa, and tongue normal and mucous membranes are moist  NECK: supple, no adenopathy, thyroid normal size, non-tender, without nodularity, no stridor, non-tender, trachea midline LYMPH:  no palpable lymphadenopathy, no hepatosplenomegaly BREAST:breasts appear normal, no suspicious masses, no skin or nipple changes or axillary nodes LUNGS: not examined HEART: not examined ABDOMEN:obese BACK: Back symmetric, no curvature. EXTREMITIES:less then 2 second capillary refill, no joint deformities, effusion, or inflammation, no skin discoloration, no cyanosis  NEURO: alert & oriented x 3 with fluent speech, no focal motor/sensory deficits, gait normal   LABORATORY DATA: CBC    Component Value Date/Time   WBC 6.3 09/07/2014 1400   RBC 3.05* 09/07/2014 1400   HGB 9.5* 09/07/2014 1400   HCT 29.2* 09/07/2014 1400   PLT 169 09/07/2014  1400   MCV 95.7 09/07/2014 1400   MCH 31.1 09/07/2014 1400   MCHC 32.5 09/07/2014 1400   RDW 15.2 09/07/2014 1400   LYMPHSABS 2.0 09/07/2014 1400   MONOABS 0.7 09/07/2014 1400   EOSABS 0.3 09/07/2014 1400   BASOSABS 0.0 09/07/2014 1400      Chemistry      Component Value Date/Time   NA 139 09/07/2014 1400   K 3.7 09/07/2014 1400   CL 111 09/07/2014 1400   CO2 21 09/07/2014 1400   BUN 29* 09/07/2014 1400   CREATININE 1.97* 09/07/2014 1400   CREATININE 2.24* 10/05/2012 1411      Component Value Date/Time   CALCIUM 8.1* 09/07/2014 1400   CALCIUM 6.8* 08/15/2013 1217   ALKPHOS 94 09/07/2014 1400   AST 56* 09/07/2014 1400   ALT 78* 09/07/2014 1400   BILITOT 0.4 09/07/2014 1400     Lab Results  Component Value Date   PROT 5.5* 09/07/2014   ALBUMINELP 62.1  08/10/2014   A1GS 6.2* 08/10/2014   A2GS 11.9* 08/10/2014   BETS 7.0 08/10/2014   BETA2SER 4.9 08/10/2014   GAMS 7.9* 08/10/2014   MSPIKE NOT DETECTED 08/10/2014   SPEI (NOTE) 08/10/2014   SPECOM (NOTE) 08/10/2014   IGGSERUM 375* 09/07/2014   IGA 60* 09/07/2014   IGMSERUM 47 09/07/2014   IMMELINT (NOTE) 08/10/2014   KPAFRELGTCHN 30.63* 09/07/2014   LAMBDASER 23.82 09/07/2014   KAPLAMBRATIO 1.29 09/07/2014  ]   ASSESSMENT AND PLAN:  Multiple myeloma Peripherally, very stable on maintenance [name], however with recent Hgb changes.  He is agreeable to bone marrow aspiration and biopsy to evaluate disease status.  This will be performed by IR due to patient's body habitus.  Order placed.   His hand cramping is improved on oral magnesium.  I have recommended he continue with magnesium.  I also recommended tonic water with quinine if cramping recurs.   Return in 3-4 weeks for follow-up and to review bone marrow aspiration and biopsy results.      THERAPY PLAN:  We will re-evaluate MM status with bone marrow aspiration and biopsy in the near future.   All questions were answered. The patient knows to call the clinic with any problems, questions or concerns. We can certainly see the patient much sooner if necessary.  Patient and plan discussed with Dr. Ancil Linsey and she is in agreement with the aforementioned.   This note is electronically signed by: Robynn Pane 09/12/2014 4:08 PM

## 2014-09-18 ENCOUNTER — Telehealth: Payer: Self-pay

## 2014-09-18 NOTE — Telephone Encounter (Signed)
-----  Message from Lendon Colonel, NP sent at 09/15/2014  7:05 AM EDT ----- Regarding: FW: Ct Biopsy . Oncology states that they are ok with him staying on the medication.He should not stop therapy.  ----- Message -----    From: Alecia Lemming    Sent: 09/13/2014   9:23 AM      To: Lendon Colonel, NP Subject: Ct Biopsy                                      Your patient is having a bone marrow Biopsy on April 28,2016 @ [facility]  He is on Xarelto and need to hold this medicine 1 day prior,if someone from your office can call and have him to hold this medicine. Thanks

## 2014-09-18 NOTE — Telephone Encounter (Signed)
Relayed message to pt to stay on Xarelto, he verbalized understanding

## 2014-09-21 ENCOUNTER — Other Ambulatory Visit (HOSPITAL_COMMUNITY): Payer: Self-pay | Admitting: Oncology

## 2014-09-21 DIAGNOSIS — C9000 Multiple myeloma not having achieved remission: Secondary | ICD-10-CM

## 2014-09-21 LAB — FECAL FAT, QUANTITATIVE: Fecal Lipids, 24 hr: 31 g/(24.h) — ABNORMAL HIGH (ref <7)

## 2014-09-21 MED ORDER — LENALIDOMIDE 5 MG PO CAPS: ORAL_CAPSULE | ORAL | Status: DC

## 2014-09-25 ENCOUNTER — Other Ambulatory Visit (HOSPITAL_COMMUNITY): Payer: Self-pay | Admitting: Oncology

## 2014-09-25 ENCOUNTER — Telehealth (INDEPENDENT_AMBULATORY_CARE_PROVIDER_SITE_OTHER): Payer: Self-pay | Admitting: *Deleted

## 2014-09-25 DIAGNOSIS — C9000 Multiple myeloma not having achieved remission: Secondary | ICD-10-CM

## 2014-09-25 MED ORDER — LENALIDOMIDE 5 MG PO CAPS: ORAL_CAPSULE | ORAL | Status: DC

## 2014-09-25 NOTE — Telephone Encounter (Signed)
[name] said he has one more question for Dr. Laural Golden if he could please return his call at [phone].

## 2014-09-25 NOTE — Telephone Encounter (Signed)
Patient was called ,he was wanting to know about his current medicine. Taking until he gets the other medicine, he has an appointment 09/26/14 and will take it til that time he said.

## 2014-09-26 ENCOUNTER — Other Ambulatory Visit: Payer: Self-pay | Admitting: Radiology

## 2014-09-26 ENCOUNTER — Ambulatory Visit (INDEPENDENT_AMBULATORY_CARE_PROVIDER_SITE_OTHER): Payer: Managed Care, Other (non HMO) | Admitting: Internal Medicine

## 2014-09-26 ENCOUNTER — Encounter (INDEPENDENT_AMBULATORY_CARE_PROVIDER_SITE_OTHER): Payer: Self-pay | Admitting: Internal Medicine

## 2014-09-26 VITALS — BP 110/80 | HR 74 | Temp 99.1°F | Resp 18 | Ht 72.0 in | Wt 345.8 lb

## 2014-09-26 DIAGNOSIS — K904 Malabsorption due to intolerance, not elsewhere classified: Secondary | ICD-10-CM | POA: Diagnosis not present

## 2014-09-26 DIAGNOSIS — R1013 Epigastric pain: Secondary | ICD-10-CM | POA: Diagnosis not present

## 2014-09-26 DIAGNOSIS — K909 Intestinal malabsorption, unspecified: Secondary | ICD-10-CM

## 2014-09-26 MED ORDER — DIPHENOXYLATE-ATROPINE 2.5-0.025 MG PO TABS: 2.0000 | ORAL_TABLET | Freq: Two times a day (BID) | ORAL | Status: DC

## 2014-09-26 NOTE — Patient Instructions (Signed)
Keep stool diary and call us with progress report in 4 weeks.

## 2014-09-26 NOTE — Progress Notes (Signed)
Presenting complaint;  Follow-up for abdominal pain and diarrhea.  Subjective:  [name] is 54 year old African-American male with multiple medical problems who is here for scheduled visit regarding steatorrhea. He was diagnosed with steatorrhea 2 years ago and felt to be second to idiopathic pancreatic insufficiency. He has had modest response with pancreatic enzyme supplement. On his last visit of 06/12/2014 he felt he was not responding to treatment. Therefore pancreatic enzyme was discontinued and quantitative fecal fat analysis was repeated. He was able to do 24-hour collection after great difficulty getting right container from the lab. He continues to have diarrhea. He is having an average of 4 bowel movements per day. Most of his stools are loose. Every now and then he has solid stool. Occasionally he may see fat droplets. He complains of strong odor to his stool. He continues to have bloating and intermittent epigastric pain. This pain is relieved when he passes flatus. His appetite is fair and he has not lost any weight since his last visit. He is taking diphenoxylate 2 tablets twice daily. He says one day he forgot and took 3 doses and noted formed stool. He is scheduled to have bone marrow biopsy on 09/28/2014.   Current Medications: Outpatient Encounter Prescriptions as of 09/26/2014  Medication Sig  . acyclovir (ZOVIRAX) 400 MG tablet Take 1 tablet (400 mg total) by mouth daily.  Marland Kitchen allopurinol (ZYLOPRIM) 300 MG tablet TAKE ONE TABLET BY MOUTH ONCE DAILY.  Marland Kitchen atorvastatin (LIPITOR) 80 MG tablet Take 1 tablet (80 mg total) by mouth at bedtime.  . calcitRIOL (ROCALTROL) 0.25 MCG capsule Take 0.25 mcg by mouth daily.  . calcium carbonate (OS-CAL) 600 MG TABS tablet Take 1,800 mg by mouth 2 (two) times daily.   Marland Kitchen dexamethasone (DECADRON) 4 MG tablet TAKE 5 TABLETS BY MOUTH 2 TIMES A DAY EVERY FRIDAY.  Marland Kitchen dexlansoprazole (DEXILANT) 60 MG capsule Take 60 mg by mouth every morning.   .  dicyclomine (BENTYL) 10 MG capsule Take 1 capsule (10 mg total) by mouth 3 (three) times daily before meals.  . diphenoxylate-atropine (LOMOTIL) 2.5-0.025 MG per tablet TAKE 2 TABLETS BY MOUTH TWICE DAILY.  . ergocalciferol (VITAMIN D2) 50000 UNITS capsule 50,000 units weekly x 4 weeks and then 50,000 units monthly (Patient taking differently: Take 50,000 Units by mouth every 30 (thirty) days. )  . HYDROcodone-acetaminophen (NORCO/VICODIN) 5-325 MG per tablet Take 1-2 tablets by mouth every 4 (four) hours as needed for moderate pain or severe pain.  Marland Kitchen insulin detemir (LEVEMIR) 100 UNIT/ML injection Inject 40 Units into the skin every morning.   . insulin lispro (HUMALOG) 100 UNIT/ML injection Inject 5-20 Units into the skin 2 (two) times daily with a meal. Sliding Scale per patient  . lenalidomide ([name]) 5 MG capsule Take 1 tablet PO 7 days on and 7 days off.  . lisinopril (PRINIVIL,ZESTRIL) 5 MG tablet Take 5 mg by mouth every other day.  . loratadine (CLARITIN) 10 MG tablet Take 10 mg by mouth every morning.   . metoprolol succinate (TOPROL-XL) 25 MG 24 hr tablet TAKE ONE TABLET BY MOUTH ONCE DAILY.  . Multiple Vitamins-Minerals (MULTIVITAMINS THER. W/MINERALS) TABS Take 1 tablet by mouth every morning.   . nitroGLYCERIN (NITROSTAT) 0.4 MG SL tablet Place 1 tablet (0.4 mg total) under the tongue every 5 (five) minutes as needed for chest pain.  . potassium chloride SA (K-DUR,KLOR-CON) 20 MEQ tablet TAKE 2 TABLETS BY MOUTH TWICE DAILY. (Patient taking differently: Take 20-40 mEq by mouth. TAKE 2 TABLETS  BY MOUTH in the morning and 1 at bedtime)  . rivaroxaban (XARELTO) 20 MG TABS tablet TAKE 1 TABLET BY MOUTH DAILY. (Patient taking differently: Take 20 mg by mouth every evening. TAKE 1 TABLET BY MOUTH DAILY.)  . sodium bicarbonate 650 MG tablet Take 1,300 mg by mouth 3 (three) times daily.  Marland Kitchen sulfamethoxazole-trimethoprim (BACTRIM DS,SEPTRA DS) 800-160 MG per tablet TAKE 1 TABLET BY MOUTH EVERY  MONDAY, WEDNESDAY, AND FRIDAY.  Marland Kitchen torsemide (DEMADEX) 20 MG tablet Take 1 tablet (20 mg total) by mouth 2 (two) times daily. TWO TABS IN AM, ONE TAB IN PM (Patient taking differently: Take 20-40 mg by mouth 2 (two) times daily. TWO TABS IN AM, ONE TAB IN PM)  . Zoledronic Acid (ZOMETA IV) Inject 2 mg into the vein every 6 (six) weeks.   . lidocaine-prilocaine (EMLA) cream Apply 1 application topically as needed (for port-a-cath access).   Marland Kitchen lipase/protease/amylase (CREON) 36000 UNITS CPEP capsule TAKE 4 CAPSULES BY MOUTH DAILY WITH MEALS AND 2 CAPSULES WITH SNACKS. (Patient not taking: Reported on 09/26/2014)     Objective: Blood pressure 110/80, pulse 74, temperature 99.1 F (37.3 C), temperature source Oral, resp. rate 18, height 6' (1.829 m), weight 345 lb 12.8 oz (156.854 kg). Patient is alert and in no acute distress. Conjunctiva is pink. Sclera is nonicteric Oropharyngeal mucosa is normal. No neck masses or thyromegaly noted. Cardiac exam with regular rhythm normal S1 and S2. No murmur or gallop noted. Lungs are clear to auscultation. Abdomen is obese. Bowel sounds are normal. Abdomen is soft with mild midepigastric tenderness with percussion note is very tympanitic. Liver edge is indistinct. No masses noted.  He has 1+ pitting edema involving both legs.  Labs/studies Results:   fecal lipids 31 g over 24 hour period and 09/12/2014.  AST and ALT were 56 and 78 respectively on 09/07/2014.  Assessment:  #1. Steatorrhea confirmed again on quantitative fecal fat analysis revealed 31 g of fat in stool per 24 hours. Steatorrhea felt to be idiopathic pancreatic insufficiency but responds to pancreatic enzymes have been suboptimal. I doubt that he has small bowel defect because he does not have deficiency of other nutrients. Will treat treat him with pancreatic enzyme supplement and see how he does. #2. Mildly elevated transaminases felt to be secondary to fatty liver. Hepatitis B surface  antigen and hepatitis C virus antibody were negative in October 2014.  Plan:  Creon 36K two capsules each meal and one with snack. 2 sample bottles given to patient. Try to obtain Ultrase MT for him.  Prescription for diphenoxylate 2 tablets by mouth twice a day for one month with 5 refills given. Office visit in 2 months.

## 2014-09-27 ENCOUNTER — Other Ambulatory Visit: Payer: Self-pay | Admitting: Radiology

## 2014-09-28 ENCOUNTER — Encounter (HOSPITAL_COMMUNITY): Payer: Self-pay

## 2014-09-28 ENCOUNTER — Ambulatory Visit (HOSPITAL_COMMUNITY)
Admission: RE | Admit: 2014-09-28 | Discharge: 2014-09-28 | Disposition: A | Discharge status: Home / Self Care | Payer: Managed Care, Other (non HMO) | Source: Ambulatory Visit | Attending: Oncology | Admitting: Oncology

## 2014-09-28 DIAGNOSIS — C9000 Multiple myeloma not having achieved remission: Secondary | ICD-10-CM | POA: Diagnosis not present

## 2014-09-28 LAB — PROTIME-INR
INR: 1.08 (ref 0.00–1.49)
Prothrombin Time: 14.1 seconds (ref 11.6–15.2)

## 2014-09-28 LAB — APTT: aPTT: 33 seconds (ref 24–37)

## 2014-09-28 LAB — BONE MARROW EXAM

## 2014-09-28 LAB — GLUCOSE, CAPILLARY: Glucose-Capillary: 135 mg/dL — ABNORMAL HIGH (ref 70–99)

## 2014-09-28 LAB — CBC
HCT: 31.2 % — ABNORMAL LOW (ref 39.0–52.0)
Hemoglobin: 10 g/dL — ABNORMAL LOW (ref 13.0–17.0)
MCH: 31.1 pg (ref 26.0–34.0)
MCHC: 32.1 g/dL (ref 30.0–36.0)
MCV: 96.9 fL (ref 78.0–100.0)
Platelets: 156 10*3/uL (ref 150–400)
RBC: 3.22 MIL/uL — ABNORMAL LOW (ref 4.22–5.81)
RDW: 15.2 % (ref 11.5–15.5)
WBC: 8.4 10*3/uL (ref 4.0–10.5)

## 2014-09-28 MED ORDER — FENTANYL CITRATE (PF) 100 MCG/2ML IJ SOLN
INTRAMUSCULAR | Status: AC
  Filled 2014-09-28: qty 4

## 2014-09-28 MED ORDER — FENTANYL CITRATE (PF) 100 MCG/2ML IJ SOLN
INTRAMUSCULAR | Status: AC | PRN
  Administered 2014-09-28 (×2): 25 ug via INTRAVENOUS

## 2014-09-28 MED ORDER — MIDAZOLAM HCL 2 MG/2ML IJ SOLN
INTRAMUSCULAR | Status: AC | PRN
  Administered 2014-09-28: 0.5 mg via INTRAVENOUS
  Administered 2014-09-28: 1 mg via INTRAVENOUS

## 2014-09-28 MED ORDER — MIDAZOLAM HCL 2 MG/2ML IJ SOLN
INTRAMUSCULAR | Status: AC
  Filled 2014-09-28: qty 6

## 2014-09-28 MED ORDER — SODIUM CHLORIDE 0.9 % IV SOLN
Freq: Once | INTRAVENOUS | Status: AC
  Administered 2014-09-28: 10:00:00 via INTRAVENOUS

## 2014-09-28 NOTE — Discharge Instructions (Signed)
Conscious Sedation, Adult, Care After °Refer to this sheet in the next few weeks. These instructions provide you with information on caring for yourself after your procedure. Your health care provider may also give you more specific instructions. Your treatment has been planned according to current medical practices, but problems sometimes occur. Call your health care provider if you have any problems or questions after your procedure. °WHAT TO EXPECT AFTER THE PROCEDURE  °After your procedure: °· You may feel sleepy, clumsy, and have poor balance for several hours. °· Vomiting may occur if you eat too soon after the procedure. °HOME CARE INSTRUCTIONS °· Do not participate in any activities where you could become injured for at least 24 hours. Do not: °· Drive. °· Swim. °· Ride a bicycle. °· Operate heavy machinery. °· Cook. °· Use power tools. °· Climb ladders. °· Work from a high place. °· Do not make important decisions or sign legal documents until you are improved. °· If you vomit, drink water, juice, or soup when you can drink without vomiting. Make sure you have little or no nausea before eating solid foods. °· Only take over-the-counter or prescription medicines for pain, discomfort, or fever as directed by your health care provider. °· Make sure you and your family fully understand everything about the medicines given to you, including what side effects may occur. °· You should not drink alcohol, take sleeping pills, or take medicines that cause drowsiness for at least 24 hours. °· If you smoke, do not smoke without supervision. °· If you are feeling better, you may resume normal activities 24 hours after you were sedated. °· Keep all appointments with your health care provider. °SEEK MEDICAL CARE IF: °· Your skin is pale or bluish in color. °· You continue to feel nauseous or vomit. °· Your pain is getting worse and is not helped by medicine. °· You have bleeding or swelling. °· You are still sleepy or  feeling clumsy after 24 hours. °SEEK IMMEDIATE MEDICAL CARE IF: °· You develop a rash. °· You have difficulty breathing. °· You develop any type of allergic problem. °· You have a fever. °MAKE SURE YOU: °· Understand these instructions. °· Will watch your condition. °· Will get help right away if you are not doing well or get worse. °Document Released: 03/09/2013 Document Reviewed: 03/09/2013 °[employer]® Patient Information ©2015 [employer]. This information is not intended to replace advice given to you by your health care provider. Make sure you discuss any questions you have with your health care provider. ° °Bone Marrow Aspiration, Bone Marrow Biopsy °Care After °Read the instructions outlined below and refer to this sheet in the next few weeks. These discharge instructions provide you with general information on caring for yourself after you leave the hospital. Your caregiver may also give you specific instructions. While your treatment has been planned according to the most current medical practices available, unavoidable complications occasionally occur. If you have any problems or questions after discharge, call your caregiver. °FINDING OUT THE RESULTS OF YOUR TEST °Not all test results are available during your visit. If your test results are not back during the visit, make an appointment with your caregiver to find out the results. Do not assume everything is normal if you have not heard from your caregiver or the medical facility. It is important for you to follow up on all of your test results.  °HOME CARE INSTRUCTIONS  °You have had sedation and may be sleepy or dizzy. Your thinking   may not be as clear as usual. For the next 24 hours: °· Only take over-the-counter or prescription medicines for pain, discomfort, and or fever as directed by your caregiver. °· Do not drink alcohol. °· Do not smoke. °· Do not drive. °· Do not make important legal decisions. °· Do not operate heavy machinery. °· Do not  care for small children by yourself. °· Keep your dressing clean and dry. You may replace dressing with a bandage after 24 hours. °· You may take a bath or shower after 24 hours. °· Use an ice pack for 20 minutes every 2 hours while awake for pain as needed. °SEEK MEDICAL CARE IF:  °· There is redness, swelling, or increasing pain at the biopsy site. °· There is pus coming from the biopsy site. °· There is drainage from a biopsy site lasting longer than one day. °· An unexplained oral temperature above 102° F (38.9° C) develops. °SEEK IMMEDIATE MEDICAL CARE IF:  °· You develop a rash. °· You have difficulty breathing. °· You develop any reaction or side effects to medications given. °Document Released: 12/06/2004 Document Revised: 08/11/2011 Document Reviewed: 05/16/2008 °[employer]® Patient Information ©2015 [employer]. This information is not intended to replace advice given to you by your health care provider. Make sure you discuss any questions you have with your health care provider. ° °

## 2014-09-28 NOTE — H&P (Signed)
 Referring Physician(s): Kefalas,[name] S  History of Present Illness:  [name] is a 53 y.o. male with multiple myeloma with ongoing treatment. He has been seen by [name] Kefalas PA-C and is scheduled today for image guided bone marrow biopsy to evaluate progression of disease. He denies any chest pain, shortness of breath or palpitations. He denies any active signs of bleeding or excessive bruising. He denies any recent fever or chills. The patient does have OSA and uses a CPAP. He has previously tolerated sedation without complications.    Past Medical History  Diagnosis Date  . Arteriosclerotic cardiovascular disease (ASCVD)     MI-2000s; stent to the proximal LAD and diagonal in 2001; stress nuclear in 2008-impaired exercise capacity, left ventricular dilatation, moderately to severely depressed EF, apical, inferior and anteroseptal scar  . Hypertension   . 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 Diabetes mellitus     Insulin  . GERD (gastroesophageal reflux disease)   . Pedal edema     Venous insufficiency  . Obesity   . Hyperlipidemia   . Injection site reaction   . Cellulitis of leg     both legs  . Chronic kidney disease, stage 3, mod decreased GFR     Creatinine of 1.84 in 06/2011 and 1.5 in 07/2011  . Chronic diarrhea   . Sleep apnea     uses cpap  . Ulcer   . Atrial flutter   . Multiple myeloma 07/01/2011  . Myocardial infarction 2000  . Gout     Past Surgical History  Procedure Laterality Date  . Laparoscopic gastric banding  2006    has been removed  . Wrist surgery      Left; removal of bone fragment  . Incision and drainage abscess anal    . Abscess drainage      Scrotal  . Bone marrow biopsy  05/13/11  . Portacath placement  07/07/2011    Procedure: INSERTION PORT-A-CATH;  Surgeon: William S Bradford, MD;  Location: [facility];  Service: General;  Laterality: N/A;  . Colonoscopy  11/28/2011    Procedure: COLONOSCOPY;  Surgeon: Najeeb U Rehman, MD;   Location: [facility];  Service: Endoscopy;  Laterality: N/A;  930  . Esophagogastroduodenoscopy  01/02/2012    Procedure: ESOPHAGOGASTRODUODENOSCOPY (EGD);  Surgeon: Najeeb U Rehman, MD;  Location: [facility];  Service: Endoscopy;  Laterality: N/A;  100  . Esophageal biopsy  01/02/2012    Procedure: BIOPSY;  Surgeon: Najeeb U Rehman, MD;  Location: [facility];  Service: Endoscopy;  Laterality: N/A;  . Esophagogastroduodenoscopy N/A 09/20/2012    Procedure: ESOPHAGOGASTRODUODENOSCOPY (EGD);  Surgeon: Najeeb U Rehman, MD;  Location: [facility];  Service: Endoscopy;  Laterality: N/A;  . Eus N/A 10/07/2012    Procedure: UPPER ENDOSCOPIC ULTRASOUND (EUS) LINEAR;  Surgeon: Daniel P Jacobs, MD;  Location: [facility];  Service: Endoscopy;  Laterality: N/A;  . Cardioversion N/A 10/13/2012    Procedure: CARDIOVERSION;  Surgeon: Robert M Rothbart, MD;  Location: [facility];  Service: Cardiovascular;  Laterality: N/A;  . Cardiac catheterization      cardiac stent  . Port-a-cath removal Left 12/07/2012    Procedure: REMOVAL PORT-A-CATH;  Surgeon: William S Bradford, MD;  Location: [facility];  Service: General;  Laterality: Left;  . Portacath placement N/A 12/07/2012    Procedure: INSERTION PORT-A-CATH;  Surgeon: William S Bradford, MD;  Location: [facility];  Service: General;  Laterality: N/A;  Attempted portacath placement on left and right side  .   Cataract extraction w/phaco Left 02/13/2014    Procedure: CATARACT EXTRACTION PHACO AND INTRAOCULAR LENS PLACEMENT (IOC);  Surgeon: Kerry Hunt, MD;  Location: [facility];  Service: Ophthalmology;  Laterality: Left;  CDE:  7.67  . Cataract extraction w/phaco Right 03/02/2014    Procedure: CATARACT EXTRACTION PHACO AND INTRAOCULAR LENS PLACEMENT RIGHT EYE CDE=16.81;  Surgeon: Kerry Hunt, MD;  Location: [facility];  Service: Ophthalmology;  Laterality: Right;    Allergies: Review of patient's allergies indicates no known allergies.  Medications: Prior to Admission medications     Medication Sig Start Date End Date Taking? Authorizing Provider  acyclovir (ZOVIRAX) 400 MG tablet Take 1 tablet (400 mg total) by mouth daily. 08/30/13   [name] S Kefalas, PA-C  allopurinol (ZYLOPRIM) 300 MG tablet TAKE ONE TABLET BY MOUTH ONCE DAILY. 05/22/14   [name] S Kefalas, PA-C  atorvastatin (LIPITOR) 80 MG tablet Take 1 tablet (80 mg total) by mouth at bedtime. 07/07/14   Gregg W Taylor, MD  calcitRIOL (ROCALTROL) 0.25 MCG capsule Take 0.25 mcg by mouth daily. 12/14/13   Historical Provider, MD  calcium carbonate (OS-CAL) 600 MG TABS tablet Take 1,800 mg by mouth 2 (two) times daily.     Historical Provider, MD  dexamethasone (DECADRON) 4 MG tablet TAKE 5 TABLETS BY MOUTH 2 TIMES A DAY EVERY FRIDAY. 08/28/14   [name] S Kefalas, PA-C  dexlansoprazole (DEXILANT) 60 MG capsule Take 60 mg by mouth every morning.     Historical Provider, MD  dicyclomine (BENTYL) 10 MG capsule Take 1 capsule (10 mg total) by mouth 3 (three) times daily before meals. 08/07/14   Najeeb U Rehman, MD  diphenoxylate-atropine (LOMOTIL) 2.5-0.025 MG per tablet Take 2 tablets by mouth 2 (two) times daily. 09/26/14   Najeeb U Rehman, MD  ergocalciferol (VITAMIN D2) 50000 UNITS capsule 50,000 units weekly x 4 weeks and then 50,000 units monthly Patient taking differently: Take 50,000 Units by mouth every 30 (thirty) days.  08/25/13   [name] S Kefalas, PA-C  HYDROcodone-acetaminophen (NORCO/VICODIN) 5-325 MG per tablet Take 1-2 tablets by mouth every 4 (four) hours as needed for moderate pain or severe pain. 09/07/14   [name] K Penland, MD  insulin detemir (LEVEMIR) 100 UNIT/ML injection Inject 40 Units into the skin every morning.  09/08/12   Jehanzeb Memon, MD  insulin lispro (HUMALOG) 100 UNIT/ML injection Inject 5-20 Units into the skin 2 (two) times daily with a meal. Sliding Scale per patient    Historical Provider, MD  lenalidomide ([name]) 5 MG capsule Take 1 tablet PO 7 days on and 7 days off. 09/25/14   [name] S Kefalas,  PA-C  lidocaine-prilocaine (EMLA) cream Apply 1 application topically as needed (for port-a-cath access).     Historical Provider, MD  lipase/protease/amylase (CREON) 36000 UNITS CPEP capsule TAKE 4 CAPSULES BY MOUTH DAILY WITH MEALS AND 2 CAPSULES WITH SNACKS. Patient not taking: Reported on 09/26/2014 04/26/14   Terri L Setzer, NP  lisinopril (PRINIVIL,ZESTRIL) 5 MG tablet Take 5 mg by mouth every other day. 12/14/13   Historical Provider, MD  loratadine (CLARITIN) 10 MG tablet Take 10 mg by mouth every morning.     Historical Provider, MD  metoprolol succinate (TOPROL-XL) 25 MG 24 hr tablet TAKE ONE TABLET BY MOUTH ONCE DAILY. 05/22/14   Gregg W Taylor, MD  Multiple Vitamins-Minerals (MULTIVITAMINS THER. W/MINERALS) TABS Take 1 tablet by mouth every morning.     Historical Provider, MD  nitroGLYCERIN (NITROSTAT) 0.4 MG SL tablet Place 1 tablet (0.4 mg total)   under the tongue every 5 (five) minutes as needed for chest pain. 09/17/12   Lendon Colonel, NP  potassium chloride SA (K-DUR,KLOR-CON) 20 MEQ tablet TAKE 2 TABLETS BY MOUTH TWICE DAILY. Patient taking differently: Take 20-40 mEq by mouth. TAKE 2 TABLETS BY MOUTH in the morning and 1 at bedtime 04/26/14   Manon Hilding Kefalas, PA-C  rivaroxaban (XARELTO) 20 MG TABS tablet TAKE 1 TABLET BY MOUTH DAILY. Patient taking differently: Take 20 mg by mouth every evening. TAKE 1 TABLET BY MOUTH DAILY. 12/21/13   Lendon Colonel, NP  sodium bicarbonate 650 MG tablet Take 1,300 mg by mouth 3 (three) times daily.    Historical Provider, MD  sulfamethoxazole-trimethoprim (BACTRIM DS,SEPTRA DS) 800-160 MG per tablet TAKE 1 TABLET BY MOUTH EVERY MONDAY, [place], AND FRIDAY. 06/11/14   Baird Cancer, PA-C  torsemide (DEMADEX) 20 MG tablet Take 1 tablet (20 mg total) by mouth 2 (two) times daily. TWO TABS IN AM, ONE TAB IN PM Patient taking differently: Take 20-40 mg by mouth 2 (two) times daily. TWO TABS IN AM, ONE TAB IN PM 04/26/14   Butch Penny, NP   Zoledronic Acid (ZOMETA IV) Inject 2 mg into the vein every 6 (six) weeks.     Historical Provider, MD     Family History  Problem Relation Age of Onset  . Heart disease Mother   . Cancer Mother   . Diabetes Father   . Anesthesia problems Neg Hx   . Hypotension Neg Hx   . Malignant hyperthermia Neg Hx   . Pseudochol deficiency Neg Hx   . Arthritis      History   Social History  . Marital Status: Married    Spouse Name: N/A  . Number of Children: N/A  . Years of Education: 16   Occupational History  . Nurse, learning disability   . Funeral Director    Social History Main Topics  . Smoking status: Former Smoker -- 0.25 packs/day for 1 years    Types: Cigarettes, Cigars    Quit date: 05/17/2001  . Smokeless tobacco: Never Used  . Alcohol Use: No  . Drug Use: No  . Sexual Activity: Yes    Birth Control/ Protection: None   Other Topics Concern  . None   Social History Narrative    Review of Systems: A 12 point ROS discussed and pertinent positives are indicated in the HPI above.  All other systems are negative.  Review of Systems  Vital Signs: BP 126/74 mmHg  Pulse 55  Temp(Src) 97.7 F (36.5 C) (Oral)  Resp 18  SpO2 98%  Physical Exam  Constitutional: He is oriented to person, place, and time. No distress.  HENT:  Head: Normocephalic and atraumatic.  Neck: No tracheal deviation present.  Cardiovascular: Normal rate and regular rhythm.  Exam reveals no gallop and no friction rub.   No murmur heard. Pulmonary/Chest: Effort normal and breath sounds normal. No respiratory distress. He has no wheezes. He has no rales.  Abdominal: Soft. Bowel sounds are normal. He exhibits no distension. There is no tenderness.  Neurological: He is alert and oriented to person, place, and time.  Skin: He is not diaphoretic.    Mallampati Score:  MD Evaluation Airway: WNL Heart: WNL Abdomen: WNL Chest/ Lungs: WNL ASA  Classification: 3 Mallampati/Airway Score:  Two  Imaging: No results found.  Labs:  CBC:  Recent Labs  05/19/14 1303 06/29/14 1356 08/10/14 1502 09/07/14 1400  WBC 5.5 6.5 5.4 6.3  HGB 9.9* 9.5* 8.8* 9.5*  HCT 29.9* 29.7* 27.2* 29.2*  PLT 140* 146* 152 169    COAGS: No results for input(s): INR, APTT in the last 8760 hours.  BMP:  Recent Labs  05/19/14 1303 06/29/14 1356 08/10/14 1322 09/07/14 1400  NA 138 141 140 139  K 4.6 3.8 3.7 3.7  CL 106 111 116* 111  CO2 18* _0 GLUCOSE 193* 131* 138* 144*  BUN 32* 34* 31* 29*  CALCIUM 8.9 8.4 7.8* 8.1*  CREATININE 1.76* 1.77* 1.86* 1.97*  GFRNONAA 42* 42* 39* 36*  GFRAA 48* 48* 45* 42*    LIVER FUNCTION TESTS:  Recent Labs  06/12/14 0930 06/29/14 1356 08/10/14 1322 09/07/14 1400  BILITOT 0.4 0.5 0.6 0.4  AST 78* 40* 60* 56*  ALT 92* 79* 60* 78*  ALKPHOS 111 94 81 94  PROT 5.3* 5.4*  5.6* 5.1*  5.7* 5.5*  ALBUMIN 3.3* 3.3* 3.3* 3.1*    Assessment and Plan: Multiple Myeloma Seen by Robynn Pane PA-C Scheduled today for image guided bone marrow biopsy with moderate sedation to evaluate stage of multiple myeloma The patient has been NPO, no blood thinners taken, labs and vitals have been reviewed. Risks and Benefits discussed with the patient including, but not limited to bleeding, infection, damage to adjacent structures or low yield requiring additional tests. All of the patient's questions were answered, patient is agreeable to proceed. Consent signed and in chart. OSA uses CPAP Atrial flutter on Xarelto    Signed: Hedy Jacob 09/28/2014, 9:38 AM

## 2014-09-28 NOTE — Procedures (Signed)
Technically successful CT guided bone marrow aspiration and biopsy of left iliac crest. No immediate complications.   

## 2014-10-05 ENCOUNTER — Encounter (HOSPITAL_COMMUNITY): Payer: Managed Care, Other (non HMO)

## 2014-10-05 ENCOUNTER — Other Ambulatory Visit (HOSPITAL_COMMUNITY): Payer: Managed Care, Other (non HMO)

## 2014-10-05 ENCOUNTER — Other Ambulatory Visit (HOSPITAL_COMMUNITY): Payer: Self-pay | Admitting: Oncology

## 2014-10-06 ENCOUNTER — Encounter (HOSPITAL_COMMUNITY): Payer: Self-pay | Admitting: Hematology & Oncology

## 2014-10-06 ENCOUNTER — Encounter (HOSPITAL_COMMUNITY): Payer: Managed Care, Other (non HMO) | Attending: Oncology | Admitting: Hematology & Oncology

## 2014-10-06 VITALS — BP 138/86 | HR 64 | Temp 99.1°F | Resp 20 | Wt 341.5 lb

## 2014-10-06 DIAGNOSIS — E119 Type 2 diabetes mellitus without complications: Secondary | ICD-10-CM

## 2014-10-06 DIAGNOSIS — C9000 Multiple myeloma not having achieved remission: Secondary | ICD-10-CM

## 2014-10-06 DIAGNOSIS — R252 Cramp and spasm: Secondary | ICD-10-CM | POA: Diagnosis not present

## 2014-10-06 DIAGNOSIS — N183 Chronic kidney disease, stage 3 (moderate): Secondary | ICD-10-CM

## 2014-10-06 DIAGNOSIS — Z808 Family history of malignant neoplasm of other organs or systems: Secondary | ICD-10-CM

## 2014-10-06 DIAGNOSIS — E118 Type 2 diabetes mellitus with unspecified complications: Secondary | ICD-10-CM

## 2014-10-06 DIAGNOSIS — C9001 Multiple myeloma in remission: Secondary | ICD-10-CM

## 2014-10-06 LAB — CHROMOSOME ANALYSIS, BONE MARROW

## 2014-10-06 LAB — TISSUE HYBRIDIZATION (BONE MARROW)-NCBH

## 2014-10-06 NOTE — Progress Notes (Addendum)
[name], MD [address] [name] [address]    DIAGNOSIS:  Myeloma, kappa light chain, Stage II disease (no evidence of monoclonal protein in serum Evaluation at [facility] for transplant, felt not to be a good transplant candidate Stage III CKD 3 g proteinuria at diagnosis, Bence-Jones proteins  BMBX at diagnosis with 19% plasma cells, kappa restricted  BMBX 09/28/2014 showing variably cellular marrow with trilineage hematopoiesis and 2% plasma cells. IHC confirms the presence of a minor plasma cell component which appears to show polyclonal staining pattern per And lambda light chain. Latter appearance is nonspecific and not diagnostic of a plasma cell neoplasm. Background shows trilineage hematopoiesis with nonspecific changes.   CURRENT THERAPY: Lenalidomide 5 mg 7 days on and 7 days off with 20 mg Dexamethasone BID every Friday only. Zometa  2 mg every 4 weeks.   INTERVAL HISTORY: [name] 54 y.o. male returns for follow-up of myeloma. He takes his [name] as prescribed. He has multiple nonspecific complaints such as intermittent nosebleeds, intermittent toothaches, and numbness in his feet that is chronic.  He is on Cymbalta and aspirin secondary to cardiac issues. He has been getting significant hand cramping which extended up into his arms. He says it is annoying and not debilitating. It is not affecting his ADLs. He says "his arms lock up, not like the basic leg cramps, but that they lock up across the joints really bad and draw in. It goes up the arms, as well. It is more of a spasm than a cramp." Says that heating pads help with the muscle spasms.  He has multiple medical issues including obesity, coronary artery disease status post an MI and stent placement, insulin-dependent diabetes and renal insufficiency.   He keeps up with his dental exams twice a year.  He reports he has "severe" diarrhea and this is chronic.He has a nonhealing ulcer on his left foot,  is seeing Dr. Zada Girt at the [facility] in [place]. He is a Technical sales engineer. He continues to work.  MEDICAL HISTORY: Past Medical History  Diagnosis Date  . Arteriosclerotic cardiovascular disease (ASCVD)     MI-2000s; stent to the proximal LAD and diagonal in 2001; stress nuclear in 2008-impaired exercise capacity, left ventricular dilatation, moderately to severely depressed EF, apical, inferior and anteroseptal scar  . Hypertension   . 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 Diabetes mellitus     Insulin  . GERD (gastroesophageal reflux disease)   . Pedal edema     Venous insufficiency  . Obesity   . Hyperlipidemia   . Injection site reaction   . Cellulitis of leg     both legs  . Chronic kidney disease, stage 3, mod decreased GFR     Creatinine of 1.84 in 06/2011 and 1.5 in 07/2011  . Chronic diarrhea   . Sleep apnea     uses cpap  . Ulcer   . Atrial flutter   . Multiple myeloma 07/01/2011  . Myocardial infarction 2000  . Gout     has DIABETES MELLITUS, TYPE II, ON INSULIN; Morbid obesity; SLEEP APNEA; Multiple myeloma; Arteriosclerotic cardiovascular disease (ASCVD); Hypertension; Hyperlipidemia; DDD (degenerative disc disease), cervical; Atrial flutter; Chronic pancreatitis; Anemia, normocytic normochromic; and Chronic systolic heart failure on his problem list.     has No Known Allergies.  Mr. Vos does not currently have medications on file.  SURGICAL HISTORY: Past Surgical History  Procedure Laterality Date  . Laparoscopic gastric banding  2006    has been removed  . Wrist surgery      Left; removal of bone fragment  . Incision and drainage abscess anal    . Abscess drainage      Scrotal  . Bone marrow biopsy  05/13/11  . Portacath placement  07/07/2011    Procedure: INSERTION PORT-A-CATH;  Surgeon: Scherry Ran, MD;  Location: [facility];  Service: General;  Laterality: N/A;  . Colonoscopy  11/28/2011    Procedure: COLONOSCOPY;  Surgeon: Rogene [place], MD;  Location: [facility];  Service: Endoscopy;  Laterality: N/A;  930  . Esophagogastroduodenoscopy  01/02/2012    Procedure: ESOPHAGOGASTRODUODENOSCOPY (EGD);  Surgeon: Rogene [place], MD;  Location: [facility];  Service: Endoscopy;  Laterality: N/A;  100  . Esophageal biopsy  01/02/2012    Procedure: BIOPSY;  Surgeon: Rogene [place], MD;  Location: [facility];  Service: Endoscopy;  Laterality: N/A;  . Esophagogastroduodenoscopy N/A 09/20/2012    Procedure: ESOPHAGOGASTRODUODENOSCOPY (EGD);  Surgeon: Rogene [place], MD;  Location: [facility];  Service: Endoscopy;  Laterality: N/A;  . Eus N/A 10/07/2012    Procedure: UPPER ENDOSCOPIC ULTRASOUND (EUS) LINEAR;  Surgeon: Milus Banister, MD;  Location: [facility];  Service: Endoscopy;  Laterality: N/A;  . Cardioversion N/A 10/13/2012    Procedure: CARDIOVERSION;  Surgeon: Yehuda [place]annah, MD;  Location: [facility];  Service: Cardiovascular;  Laterality: N/A;  . Cardiac catheterization      cardiac stent  . Port-a-cath removal Left 12/07/2012    Procedure: REMOVAL PORT-A-CATH;  Surgeon: Scherry Ran, MD;  Location: [facility];  Service: General;  Laterality: Left;  . Portacath placement N/A 12/07/2012    Procedure: INSERTION PORT-A-CATH;  Surgeon: Scherry Ran, MD;  Location: [facility];  Service: General;  Laterality: N/A;  Attempted portacath placement on left and right side  . Cataract extraction w/phaco Left 02/13/2014    Procedure: CATARACT EXTRACTION PHACO AND INTRAOCULAR LENS PLACEMENT (IOC);  Surgeon: Tonny Branch, MD;  Location: [facility];  Service: Ophthalmology;  Laterality: Left;  CDE:  7.67  . Cataract extraction w/phaco Right 03/02/2014    Procedure: CATARACT EXTRACTION PHACO AND INTRAOCULAR LENS PLACEMENT RIGHT EYE CDE=16.81;  Surgeon: Tonny Branch, MD;  Location: [facility];  Service: Ophthalmology;  Laterality: Right;    SOCIAL HISTORY: History   Social History  . Marital Status: Married    Spouse Name: N/A  . Number of  Children: N/A  . Years of Education: 16   Occupational History  . Nurse, learning disability   . Funeral Director    Social History Main Topics  . Smoking status: Former Smoker -- 0.25 packs/day for 1 years    Types: Cigarettes, Cigars    Quit date: 05/17/2001  . Smokeless tobacco: Never Used  . Alcohol Use: No  . Drug Use: No  . Sexual Activity: Yes    Birth Control/ Protection: None   Other Topics Concern  . Not on file   Social History Narrative    FAMILY HISTORY: Family History  Problem Relation Age of Onset  . Heart disease Mother   . Cancer Mother   . Diabetes Father   . Anesthesia problems Neg Hx   . Hypotension Neg Hx   . Malignant hyperthermia Neg Hx   . Pseudochol deficiency Neg Hx   . Arthritis      Review of Systems  Constitutional: Negative.   HENT: Negative.   Eyes: Negative.   Respiratory: Negative.  Cardiovascular: Negative.   Gastrointestinal: Positive for diarrhea .   Genitourinary: Negative.   Musculoskeletal: Positive for joint pain.        Muscle spasms in hands.       Left foot non healing ulcer. Will be seeing [facility], Dr. Zada Girt Skin: Negative.   Neurological: Negative.  Psychiatric/Behavioral: Negative.     PHYSICAL EXAMINATION   ECOG PERFORMANCE STATUS: 1 - Symptomatic but completely ambulatory  Filed Vitals:   10/06/14 1416  BP: 138/86  Pulse: 64  Temp: 99.1 F (37.3 C)  Resp: 20    Physical Exam  Constitutional: He is oriented to person, place, and time and well-developed, well-nourished, and in no distress. Obese HENT:  Head: Normocephalic and atraumatic.  Nose: Nose normal.  Mouth/Throat: Oropharynx is clear and moist. No oropharyngeal exudate.  Eyes: Conjunctivae and EOM are normal. Pupils are equal, round, and reactive to light. Right eye exhibits no discharge. Left eye exhibits no discharge. No scleral icterus.  Neck: Normal range of motion. Neck supple. No tracheal deviation present. No thyromegaly  present.  Cardiovascular: Normal rate, regular rhythm and normal heart sounds.  Exam reveals no gallop and no friction rub.   No murmur heard. Pulmonary/Chest: Effort normal and breath sounds normal. He has no wheezes. He has no rales.  Abdominal: Soft. Bowel sounds are normal. He exhibits no distension and no mass. There is no tenderness. There is no rebound and no guarding.  Musculoskeletal: Normal range of motion. He exhibit edema. L foot in a foot brace Lymphadenopathy:    He has no cervical adenopathy.  Neurological: He is alert and oriented to person, place, and time. He has normal reflexes. No cranial nerve deficit. Gait normal. Coordination normal.  Skin: Skin is warm and dry. No rash noted.  Psychiatric: Mood, memory, affect and judgment normal.  Nursing note and vitals reviewed.   LABORATORY DATA:  CBC    Component Value Date/Time   WBC 8.4 09/28/2014 0940   RBC 3.22* 09/28/2014 0940   HGB 10.0* 09/28/2014 0940   HCT 31.2* 09/28/2014 0940   PLT 156 09/28/2014 0940   MCV 96.9 09/28/2014 0940   MCH 31.1 09/28/2014 0940   MCHC 32.1 09/28/2014 0940   RDW 15.2 09/28/2014 0940   LYMPHSABS 2.0 09/07/2014 1400   MONOABS 0.7 09/07/2014 1400   EOSABS 0.3 09/07/2014 1400   BASOSABS 0.0 09/07/2014 1400   CMP     Component Value Date/Time   NA 139 09/07/2014 1400   K 3.7 09/07/2014 1400   CL 111 09/07/2014 1400   CO2 21 09/07/2014 1400   GLUCOSE 144* 09/07/2014 1400   BUN 29* 09/07/2014 1400   CREATININE 1.97* 09/07/2014 1400   CREATININE 2.24* 10/05/2012 1411   CALCIUM 8.1* 09/07/2014 1400   CALCIUM 6.8* 08/15/2013 1217   PROT 5.5* 09/07/2014 1400   ALBUMIN 3.1* 09/07/2014 1400   AST 56* 09/07/2014 1400   ALT 78* 09/07/2014 1400   ALKPHOS 94 09/07/2014 1400   BILITOT 0.4 09/07/2014 1400   GFRNONAA 36* 09/07/2014 1400   GFRAA 42* 09/07/2014 1400     ASSESSMENT and THERAPY PLAN:   Multiple myeloma, light chain disease Evaluation at [facility] for transplant, felt not  to be a good transplant candidate Stage III CKD Diabetes Non healing L foot ulcer  I have reviewed all of the data with the patient including his bone marrow biopsy and recent myeloma studies. He is in a CR. I advised him at this point he  has several options including continuing his [name] as he is taking it before proceeding with observation. In regards to his Zometa I have strongly encouraged him to consider spacing his interval out to at least 3 months. I again reviewed with him the lack of data on how to proceed with Zometa dosing after 2 years of therapy. I continue to encourage good oral care. He would like to stay on his [name] and his dexamethasone. He is willing to move his Zometa out to every 3 months.   He expresses concerns regarding his mother's death from myeloma and how it affects him. I advised him I certainly understood and supported the decisions that he makes. I advised him some of his blood count abnormalities may be from his ongoing [name] use. We will continue to follow his CBCs closely. I also advised him that some of his muscle cramping may be from the [name] as well. He is open to that possibility. He does not feel like however the cramping affects his activities of daily living.   He would like to continue to follow blood work on a 6 week basis we will arrange for that. I will see him back in 3 months.  All questions were answered. The patient knows to call the clinic with any problems, questions or concerns. We can certainly see the patient much sooner if necessary. This note was electronically signed.  This document serves as a record of services personally performed by Ancil Linsey, MD. It was created on her behalf by Arlyce Harman, a trained medical scribe. The creation of this record is based on the scribe's personal observations and the provider's statements to them. This document has been checked and approved by the attending provider. I have reviewed the  above documentation for accuracy and completeness and I agree with the above.  Ancil Linsey, MD

## 2014-10-06 NOTE — Patient Instructions (Signed)
..  [place] at [facility] Discharge Instructions  RECOMMENDATIONS MADE BY THE CONSULTANT AND ANY TEST RESULTS WILL BE SENT TO YOUR REFERRING PHYSICIAN.  Follow up appointment in 3 months.  Continue with treatment plan as scheduled. Call for any new or worsening symptoms, questions, or concerns.   Thank you for choosing [place] at [facility] to provide your oncology and hematology care.  To afford each patient quality time with our provider, please arrive at least 15 minutes before your scheduled appointment time.    You need to re-schedule your appointment should you arrive 10 or more minutes late.  We strive to give you quality time with our providers, and arriving late affects you and other patients whose appointments are after yours.  Also, if you no show three or more times for appointments you may be dismissed from the clinic at the providers discretion.     Again, thank you for choosing [facility].  Our hope is that these requests will decrease the amount of time that you wait before being seen by our physicians.       _____________________________________________________________  Should you have questions after your visit to [facility], please contact our office at [phone] between the hours of 8:30 a.m. and 4:30 p.m.  Voicemails left after 4:30 p.m. will not be returned until the following business day.  For prescription refill requests, have your pharmacy contact our office.

## 2014-10-17 ENCOUNTER — Telehealth (INDEPENDENT_AMBULATORY_CARE_PROVIDER_SITE_OTHER): Payer: Self-pay | Admitting: *Deleted

## 2014-10-17 NOTE — Telephone Encounter (Signed)
Would like to know if we have any samples of Creon. His return phone number is [phone] or [phone].

## 2014-10-18 NOTE — Telephone Encounter (Signed)
Samples are ready for the patient to pick up. Patient call was made.

## 2014-10-19 ENCOUNTER — Ambulatory Visit (HOSPITAL_COMMUNITY): Payer: Managed Care, Other (non HMO)

## 2014-10-20 ENCOUNTER — Other Ambulatory Visit (HOSPITAL_COMMUNITY): Payer: Self-pay | Admitting: Oncology

## 2014-10-20 DIAGNOSIS — C9000 Multiple myeloma not having achieved remission: Secondary | ICD-10-CM

## 2014-10-20 MED ORDER — LENALIDOMIDE 5 MG PO CAPS: 5.0000 mg | ORAL_CAPSULE | Freq: Every day | ORAL | Status: DC

## 2014-11-01 ENCOUNTER — Encounter (HOSPITAL_COMMUNITY): Payer: Self-pay

## 2014-11-02 ENCOUNTER — Ambulatory Visit (HOSPITAL_COMMUNITY): Payer: Managed Care, Other (non HMO) | Admitting: Hematology & Oncology

## 2014-11-02 ENCOUNTER — Other Ambulatory Visit (HOSPITAL_COMMUNITY): Payer: Managed Care, Other (non HMO)

## 2014-11-02 ENCOUNTER — Encounter (HOSPITAL_COMMUNITY): Payer: Managed Care, Other (non HMO)

## 2014-11-10 ENCOUNTER — Ambulatory Visit (HOSPITAL_COMMUNITY): Payer: Managed Care, Other (non HMO) | Admitting: Hematology & Oncology

## 2014-11-10 ENCOUNTER — Other Ambulatory Visit (HOSPITAL_COMMUNITY): Payer: Managed Care, Other (non HMO)

## 2014-11-16 ENCOUNTER — Telehealth (INDEPENDENT_AMBULATORY_CARE_PROVIDER_SITE_OTHER): Payer: Self-pay | Admitting: *Deleted

## 2014-11-16 NOTE — Telephone Encounter (Signed)
Needs samples of Creon. His return phone number is [phone].

## 2014-11-17 ENCOUNTER — Encounter (HOSPITAL_COMMUNITY): Payer: Self-pay

## 2014-11-17 ENCOUNTER — Other Ambulatory Visit (HOSPITAL_COMMUNITY): Payer: Managed Care, Other (non HMO)

## 2014-11-20 ENCOUNTER — Other Ambulatory Visit (HOSPITAL_COMMUNITY): Payer: Self-pay | Admitting: Oncology

## 2014-11-20 DIAGNOSIS — C9000 Multiple myeloma not having achieved remission: Secondary | ICD-10-CM

## 2014-11-20 MED ORDER — LENALIDOMIDE 5 MG PO CAPS: 5.0000 mg | ORAL_CAPSULE | Freq: Every day | ORAL | Status: DC

## 2014-11-21 ENCOUNTER — Other Ambulatory Visit (INDEPENDENT_AMBULATORY_CARE_PROVIDER_SITE_OTHER): Payer: Self-pay | Admitting: Internal Medicine

## 2014-11-21 NOTE — Telephone Encounter (Signed)
Patient was called and a message was left on his cell phone voicemail. That samples are ready.

## 2014-11-21 NOTE — Telephone Encounter (Signed)
Please let patient know there are samples at the front desk.

## 2014-11-21 NOTE — Telephone Encounter (Signed)
Orvis called back to see if we had samples. He has not heard from anyone.

## 2014-11-22 ENCOUNTER — Encounter (HOSPITAL_COMMUNITY): Payer: Managed Care, Other (non HMO) | Attending: Oncology

## 2014-11-22 DIAGNOSIS — C9000 Multiple myeloma not having achieved remission: Secondary | ICD-10-CM

## 2014-11-22 LAB — COMPREHENSIVE METABOLIC PANEL
ALT: 56 U/L (ref 17–63)
AST: 35 U/L (ref 15–41)
Albumin: 3.2 g/dL — ABNORMAL LOW (ref 3.5–5.0)
Alkaline Phosphatase: 93 U/L (ref 38–126)
Anion gap: 7 (ref 5–15)
BUN: 30 mg/dL — ABNORMAL HIGH (ref 6–20)
CO2: 21 mmol/L — ABNORMAL LOW (ref 22–32)
Calcium: 9 mg/dL (ref 8.9–10.3)
Chloride: 110 mmol/L (ref 101–111)
Creatinine, Ser: 1.74 mg/dL — ABNORMAL HIGH (ref 0.61–1.24)
GFR calc Af Amer: 49 mL/min — ABNORMAL LOW (ref >60)
GFR calc non Af Amer: 42 mL/min — ABNORMAL LOW (ref >60)
Glucose, Bld: 195 mg/dL — ABNORMAL HIGH (ref 65–99)
Potassium: 3.8 mmol/L (ref 3.5–5.1)
Sodium: 138 mmol/L (ref 135–145)
Total Bilirubin: 0.9 mg/dL (ref 0.3–1.2)
Total Protein: 5.9 g/dL — ABNORMAL LOW (ref 6.5–8.1)

## 2014-11-22 LAB — CBC WITH DIFFERENTIAL/PLATELET
Basophils Absolute: 0 10*3/uL (ref 0.0–0.1)
Basophils Relative: 0 % (ref 0–1)
Eosinophils Absolute: 0.3 10*3/uL (ref 0.0–0.7)
Eosinophils Relative: 3 % (ref 0–5)
HCT: 32.7 % — ABNORMAL LOW (ref 39.0–52.0)
Hemoglobin: 10.7 g/dL — ABNORMAL LOW (ref 13.0–17.0)
Lymphocytes Relative: 30 % (ref 12–46)
Lymphs Abs: 2.6 10*3/uL (ref 0.7–4.0)
MCH: 30.7 pg (ref 26.0–34.0)
MCHC: 32.7 g/dL (ref 30.0–36.0)
MCV: 94 fL (ref 78.0–100.0)
Monocytes Absolute: 0.9 10*3/uL (ref 0.1–1.0)
Monocytes Relative: 10 % (ref 3–12)
Neutro Abs: 5.1 10*3/uL (ref 1.7–7.7)
Neutrophils Relative %: 57 % (ref 43–77)
Platelets: 145 10*3/uL — ABNORMAL LOW (ref 150–400)
RBC: 3.48 MIL/uL — ABNORMAL LOW (ref 4.22–5.81)
RDW: 15.7 % — ABNORMAL HIGH (ref 11.5–15.5)
WBC: 8.9 10*3/uL (ref 4.0–10.5)

## 2014-11-22 LAB — C-REACTIVE PROTEIN: CRP: <0.5 mg/dL (ref <1.0)

## 2014-11-22 LAB — LACTATE DEHYDROGENASE: LDH: 222 U/L — ABNORMAL HIGH (ref 98–192)

## 2014-11-23 LAB — MULTIPLE MYELOMA PANEL, SERUM
Albumin SerPl Elph-Mcnc: 2.9 g/dL (ref 2.9–4.4)
Albumin/Glob SerPl: 1.4 (ref 0.7–1.7)
Alpha 1: 0.2 g/dL (ref 0.0–0.4)
Alpha2 Glob SerPl Elph-Mcnc: 0.6 g/dL (ref 0.4–1.0)
B-Globulin SerPl Elph-Mcnc: 0.9 g/dL (ref 0.7–1.3)
Gamma Glob SerPl Elph-Mcnc: 0.3 g/dL — ABNORMAL LOW (ref 0.4–1.8)
Globulin, Total: 2.1 g/dL — ABNORMAL LOW (ref 2.2–3.9)
IgA: 61 mg/dL — ABNORMAL LOW (ref 90–386)
IgG (Immunoglobin G), Serum: 400 mg/dL — ABNORMAL LOW (ref 700–1600)
IgM, Serum: 46 mg/dL (ref 20–172)
Total Protein ELP: 5 g/dL — ABNORMAL LOW (ref 6.0–8.5)

## 2014-11-23 LAB — BETA 2 MICROGLOBULIN, SERUM: Beta-2 Microglobulin: 4 mg/L — ABNORMAL HIGH (ref 0.6–2.4)

## 2014-11-23 NOTE — Progress Notes (Signed)
LABS DRAWN

## 2014-11-24 LAB — KAPPA/LAMBDA LIGHT CHAINS
Kappa free light chain: 40.76 mg/L — ABNORMAL HIGH (ref 3.30–19.40)
Kappa, lambda light chain ratio: 1.57 (ref 0.26–1.65)
Lambda free light chains: 25.96 mg/L (ref 5.71–26.30)

## 2014-11-30 ENCOUNTER — Other Ambulatory Visit: Payer: Self-pay | Admitting: Internal Medicine

## 2014-11-30 ENCOUNTER — Other Ambulatory Visit (HOSPITAL_COMMUNITY): Payer: Self-pay | Admitting: Oncology

## 2014-12-01 ENCOUNTER — Encounter (HOSPITAL_COMMUNITY): Payer: Self-pay | Admitting: Oncology

## 2014-12-01 ENCOUNTER — Encounter (HOSPITAL_COMMUNITY): Payer: Managed Care, Other (non HMO) | Attending: Oncology | Admitting: Oncology

## 2014-12-01 VITALS — BP 111/59 | HR 51 | Temp 98.6°F | Resp 18 | Wt 343.0 lb

## 2014-12-01 DIAGNOSIS — C9000 Multiple myeloma not having achieved remission: Secondary | ICD-10-CM | POA: Insufficient documentation

## 2014-12-01 DIAGNOSIS — K1379 Other lesions of oral mucosa: Secondary | ICD-10-CM

## 2014-12-01 MED ORDER — HYDROCODONE-ACETAMINOPHEN 5-325 MG PO TABS: 1.0000 | ORAL_TABLET | ORAL | Status: DC | PRN

## 2014-12-01 NOTE — Progress Notes (Signed)
Patient is seen as a work-in at the request of Dr. Evette Doffing, Dentist is [place].  [name] reports that he started to feel a sores on the right lower gum line.  "I thought I scratched it with a toothbrush."  He reports that he noted it 10-14 days ago.  It has not gotten better.  He went to see his dentist.  He was asked to see [place] sooner than planned.  He notes a 6-7/10 right lower mandibular pain.  No discomfort on the exterior.  Tenderness to palpation of the gum line.  He notes that palpation and pressure on the right mandible increases the pain.  ROM of mandible is WNL.    On exam, he has a small 0.5 cm lesion on the medial aspect of right lower gum line, deep to his last tooth.  Lesion has what appears to be exposed bone.  Image follows below, but is poor.  In the picture, the lesion is deep to my gloved finger.   I will set the patient up for CT Panoramic view of maxillofacial area to evaluate for ONJ.  If positive, this is a dental, oral surgeon issue.    For the time being Zometa is cancelled and discontinued until this is sorted out.  His last infusion of Zometa was on 09/07/2014.  He will return following CT imaging.  Patient and plan discussed with Dr. Ancil Linsey and she is in agreement with the aforementioned.   KEFALAS,[name], PA-C 12/01/2014 1:14 PM

## 2014-12-01 NOTE — Patient Instructions (Signed)
..  [place] at [facility] Discharge Instructions  RECOMMENDATIONS MADE BY THE CONSULTANT AND ANY TEST RESULTS WILL BE SENT TO YOUR REFERRING PHYSICIAN.  Seen and discussion with Robynn Pane PA-C Call the cancer center with any question and/or concerns CT scan of face next week Return after CT scan Cancel Zometa appt scheduled for 01/04/2015 Follow up app scheduled for 01/04/2015   Thank you for choosing [place] at [facility] to provide your oncology and hematology care.  To afford each patient quality time with our provider, please arrive at least 15 minutes before your scheduled appointment time.    You need to re-schedule your appointment should you arrive 10 or more minutes late.  We strive to give you quality time with our providers, and arriving late affects you and other patients whose appointments are after yours.  Also, if you no show three or more times for appointments you may be dismissed from the clinic at the providers discretion.     Again, thank you for choosing [facility].  Our hope is that these requests will decrease the amount of time that you wait before being seen by our physicians.       _____________________________________________________________  Should you have questions after your visit to [facility], please contact our office at [phone] between the hours of 8:30 a.m. and 4:30 p.m.  Voicemails left after 4:30 p.m. will not be returned until the following business day.  For prescription refill requests, have your pharmacy contact our office.

## 2014-12-05 ENCOUNTER — Ambulatory Visit (HOSPITAL_COMMUNITY)
Admission: RE | Admit: 2014-12-05 | Discharge: 2014-12-05 | Disposition: A | Discharge status: Home / Self Care | Payer: 59 | Source: Ambulatory Visit | Attending: Oncology | Admitting: Oncology

## 2014-12-05 ENCOUNTER — Other Ambulatory Visit (HOSPITAL_COMMUNITY): Payer: Self-pay | Admitting: Oncology

## 2014-12-05 DIAGNOSIS — C9000 Multiple myeloma not having achieved remission: Secondary | ICD-10-CM | POA: Insufficient documentation

## 2014-12-05 DIAGNOSIS — J320 Chronic maxillary sinusitis: Secondary | ICD-10-CM | POA: Diagnosis not present

## 2014-12-05 DIAGNOSIS — R937 Abnormal findings on diagnostic imaging of other parts of musculoskeletal system: Secondary | ICD-10-CM | POA: Diagnosis not present

## 2014-12-05 DIAGNOSIS — M879 Osteonecrosis, unspecified: Secondary | ICD-10-CM

## 2014-12-05 DIAGNOSIS — K029 Dental caries, unspecified: Secondary | ICD-10-CM | POA: Diagnosis not present

## 2014-12-05 DIAGNOSIS — K1379 Other lesions of oral mucosa: Secondary | ICD-10-CM

## 2014-12-05 DIAGNOSIS — R6884 Jaw pain: Secondary | ICD-10-CM | POA: Diagnosis present

## 2014-12-06 ENCOUNTER — Encounter: Payer: Self-pay | Admitting: Dietician

## 2014-12-06 NOTE — Progress Notes (Signed)
Pt called regarding dietary questions for Gout. He had previously spoken with RD about a Gout diet and wanted to retrieve that information. Directed patient to use the My Chart tool to access his old notes.   He had asked specifically about bear, duck, buffalo, crab, shrimp, roe. Advised pt that all meats contain purines and can lead to gout pain. However, certain shellfish and the wild game meats tend to have a higher purine content then chicken, beef or pork.   Mailed pt handouts regarding the diet that indicate which foods fall into each category.   Burtis Junes RD, LDN Nutrition Pager: [phone] 12/06/2014 2:35 PM

## 2014-12-07 ENCOUNTER — Ambulatory Visit (HOSPITAL_COMMUNITY): Payer: Managed Care, Other (non HMO) | Admitting: Oncology

## 2014-12-07 ENCOUNTER — Other Ambulatory Visit: Payer: Self-pay | Admitting: Adult Health

## 2014-12-12 ENCOUNTER — Encounter (HOSPITAL_COMMUNITY): Payer: Self-pay | Admitting: Oncology

## 2014-12-12 ENCOUNTER — Encounter (HOSPITAL_BASED_OUTPATIENT_CLINIC_OR_DEPARTMENT_OTHER): Payer: Managed Care, Other (non HMO) | Admitting: Oncology

## 2014-12-12 VITALS — BP 96/58 | HR 56 | Temp 98.5°F | Resp 16 | Wt 356.0 lb

## 2014-12-12 DIAGNOSIS — K1370 Unspecified lesions of oral mucosa: Secondary | ICD-10-CM | POA: Diagnosis not present

## 2014-12-12 DIAGNOSIS — C9000 Multiple myeloma not having achieved remission: Secondary | ICD-10-CM | POA: Diagnosis not present

## 2014-12-12 NOTE — Patient Instructions (Addendum)
[place] at [facility] Discharge Instructions  RECOMMENDATIONS MADE BY THE CONSULTANT AND ANY TEST RESULTS WILL BE SENT TO YOUR REFERRING PHYSICIAN.  Exam and discussion today with [name], PA-C. Referral to Dr. Enrique Sack (oncology dentist). Return for office visit with Dr. Whitney Muse as scheduled in 3-4 weeks.  Thank you for choosing [place] at [facility] to provide your oncology and hematology care.  To afford each patient quality time with our provider, please arrive at least 15 minutes before your scheduled appointment time.    You need to re-schedule your appointment should you arrive 10 or more minutes late.  We strive to give you quality time with our providers, and arriving late affects you and other patients whose appointments are after yours.  Also, if you no show three or more times for appointments you may be dismissed from the clinic at the providers discretion.     Again, thank you for choosing [facility].  Our hope is that these requests will decrease the amount of time that you wait before being seen by our physicians.       _____________________________________________________________  Should you have questions after your visit to [facility], please contact our office at [phone] between the hours of 8:30 a.m. and 4:30 p.m.  Voicemails left after 4:30 p.m. will not be returned until the following business day.  For prescription refill requests, have your pharmacy contact our office.

## 2014-12-12 NOTE — Progress Notes (Signed)
Franklin Font, MD [address] [place] [address]  Multiple myeloma  CURRENT THERAPY: Lenalidomide 5 mg 7 days on and 7 days off with 20 mg Dexamethasone BID every Friday only. Zometa2 mg DISCONTINUED with last dose given on 09/07/2014.   INTERVAL HISTORY: [name] 54 y.o. male returns for followup of right mandibular with with bone exposure on exam.  Please review my previous note for details, but in short, the patient was urgently referred back to [place] sooner than his scheduled follow-up appointment after being seen by Dr. Evette Doffing, Dentist.  It was noted that [facility] had some right lower jaw pain and on exam, bone is exposed.  When I saw him, exam was performed and [name] was set-up for CT maxillofacial to evaluate for ONJ.  He was on Zometa and his last does was given on 09/07/2014.   I personally reviewed and went over radiographic studies with the patient.  The results are noted within this dictation.    He reports weight gain without any changes in his food intake.  In fact, he reports that he is eating healthier.  He is noted to be ambulating with a cane secondary to issues related to his left foot, he is in a walking boot.  As a result, exercise may be decreased and may be the cause of his weight gain, but at this time it is not a priority given his CT imaging findings.  He notes right mandibular jaw pain that is well control with pain medications at this time.  Past Medical History  Diagnosis Date  . Arteriosclerotic cardiovascular disease (ASCVD)     MI-2000s; stent to the proximal LAD and diagonal in 2001; stress nuclear in 2008-impaired exercise capacity, left ventricular dilatation, moderately to severely depressed EF, apical, inferior and anteroseptal scar  . Hypertension   . 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 Diabetes mellitus     Insulin  . GERD (gastroesophageal reflux disease)   . Pedal edema     Venous insufficiency  . Obesity   .  Hyperlipidemia   . Injection site reaction   . Cellulitis of leg     both legs  . Chronic kidney disease, stage 3, mod decreased GFR     Creatinine of 1.84 in 06/2011 and 1.5 in 07/2011  . Chronic diarrhea   . Sleep apnea     uses cpap  . Ulcer   . Atrial flutter   . Multiple myeloma 07/01/2011  . Myocardial infarction 2000  . Gout     has DIABETES MELLITUS, TYPE II, ON INSULIN; Morbid obesity; SLEEP APNEA; Multiple myeloma; Arteriosclerotic cardiovascular disease (ASCVD); Hypertension; Hyperlipidemia; DDD (degenerative disc disease), cervical; Atrial flutter; Chronic pancreatitis; Anemia, normocytic normochromic; and Chronic systolic heart failure on his problem list.     has No Known Allergies.  Current Outpatient Prescriptions on File Prior to Visit  Medication Sig Dispense Refill  . acyclovir (ZOVIRAX) 400 MG tablet TAKE ONE TABLET BY MOUTH EVERY MORNING. 30 tablet 5  . allopurinol (ZYLOPRIM) 300 MG tablet TAKE ONE TABLET BY MOUTH ONCE DAILY. 30 tablet 5  . atorvastatin (LIPITOR) 80 MG tablet Take 1 tablet (80 mg total) by mouth at bedtime. 30 tablet 6  . calcitRIOL (ROCALTROL) 0.25 MCG capsule Take 0.25 mcg by mouth daily.    . calcium carbonate (OS-CAL) 600 MG TABS tablet Take 1,800 mg by mouth 2 (two) times daily.     Marland Kitchen CREON 36000 UNITS  CPEP capsule TAKE 4 CAPSULES BY MOUTH DAILY WITH MEALS AND 2 CAPSULES WITH SNACKS. 300 capsule 3  . dexamethasone (DECADRON) 4 MG tablet TAKE 5 TABLETS BY MOUTH 2 TIMES A DAY EVERY FRIDAY. 40 tablet 3  . dexlansoprazole (DEXILANT) 60 MG capsule Take 60 mg by mouth every morning.     . dicyclomine (BENTYL) 10 MG capsule Take 1 capsule (10 mg total) by mouth 3 (three) times daily before meals. 90 capsule 5  . diphenoxylate-atropine (LOMOTIL) 2.5-0.025 MG per tablet Take 2 tablets by mouth 2 (two) times daily. 120 tablet 5  . HYDROcodone-acetaminophen (NORCO/VICODIN) 5-325 MG per tablet Take 1-2 tablets by mouth every 4 (four) hours as needed for  moderate pain or severe pain. 90 tablet 0  . insulin detemir (LEVEMIR) 100 UNIT/ML injection Inject 40 Units into the skin every morning.     . insulin lispro (HUMALOG) 100 UNIT/ML injection Inject 5-20 Units into the skin 2 (two) times daily with a meal. Sliding Scale per patient    . lenalidomide ([name]) 5 MG capsule Take 1 capsule (5 mg total) by mouth daily. Take 1 tablet PO 7 days on and 7 days off. 14 capsule 0  . lisinopril (PRINIVIL,ZESTRIL) 5 MG tablet Take 5 mg by mouth every other day.    . loratadine (CLARITIN) 10 MG tablet Take 10 mg by mouth every morning.     . metoprolol succinate (TOPROL-XL) 25 MG 24 hr tablet TAKE ONE TABLET BY MOUTH ONCE DAILY. 30 tablet 6  . Multiple Vitamins-Minerals (MULTIVITAMINS THER. W/MINERALS) TABS Take 1 tablet by mouth every morning.     . nitroGLYCERIN (NITROSTAT) 0.4 MG SL tablet Place 1 tablet (0.4 mg total) under the tongue every 5 (five) minutes as needed for chest pain. 25 tablet 3  . potassium chloride SA (K-DUR,KLOR-CON) 20 MEQ tablet TAKE 2 TABLETS BY MOUTH TWICE DAILY. 120 tablet 2  . sodium bicarbonate 650 MG tablet Take 1,300 mg by mouth 3 (three) times daily.    Marland Kitchen sulfamethoxazole-trimethoprim (BACTRIM DS,SEPTRA DS) 800-160 MG per tablet TAKE 1 TABLET BY MOUTH EVERY MONDAY, WEDNESDAY, AND FRIDAY. 12 tablet 2  . torsemide (DEMADEX) 20 MG tablet Take 1 tablet (20 mg total) by mouth 2 (two) times daily. TWO TABS IN AM, ONE TAB IN PM (Patient taking differently: Take 20-40 mg by mouth 2 (two) times daily. TWO TABS IN AM, ONE TAB IN PM) 240 tablet 3  . Vitamin D, Ergocalciferol, (DRISDOL) 50000 UNITS CAPS capsule TAKE 1 CAPSULE BY MOUTH WEEKLY FOR 4 WEEKS; THEN TAKE 1 CAPSULE MONTHLY THEREAFTER. 12 capsule 0  . XARELTO 20 MG TABS tablet TAKE 1 TABLET BY MOUTH DAILY. 30 tablet 1   Current Facility-Administered Medications on File Prior to Visit  Medication Dose Route Frequency Provider Last Rate Last Dose  . sodium chloride 0.9 % injection 10  mL  10 mL Intravenous Once Farrel Gobble, MD        Past Surgical History  Procedure Laterality Date  . Laparoscopic gastric banding  2006    has been removed  . Wrist surgery      Left; removal of bone fragment  . Incision and drainage abscess anal    . Abscess drainage      Scrotal  . Bone marrow biopsy  05/13/11  . Portacath placement  07/07/2011    Procedure: INSERTION PORT-A-CATH;  Surgeon: Scherry Ran, MD;  Location: [facility];  Service: General;  Laterality: N/A;  . Colonoscopy  11/28/2011  Procedure: COLONOSCOPY;  Surgeon: Rogene Houston, MD;  Location: [facility];  Service: Endoscopy;  Laterality: N/A;  930  . Esophagogastroduodenoscopy  01/02/2012    Procedure: ESOPHAGOGASTRODUODENOSCOPY (EGD);  Surgeon: Rogene Houston, MD;  Location: [facility];  Service: Endoscopy;  Laterality: N/A;  100  . Esophageal biopsy  01/02/2012    Procedure: BIOPSY;  Surgeon: Rogene Houston, MD;  [facility]: [facility];  Service: Endoscopy;  Laterality: N/A;  . Esophagogastroduodenoscopy N/A 09/20/2012    Procedure: ESOPHAGOGASTRODUODENOSCOPY (EGD);  Surgeon: Rogene Houston, MD;  Location: [facility];  Service: Endoscopy;  Laterality: N/A;  . Eus N/A 10/07/2012    Procedure: UPPER ENDOSCOPIC ULTRASOUND (EUS) LINEAR;  Surgeon: Milus Banister, MD;  Location: [facility];  Service: Endoscopy;  Laterality: N/A;  . Cardioversion N/A 10/13/2012    Procedure: CARDIOVERSION;  Surgeon: Yehuda Savannah, MD;  Location: [facility];  Service: Cardiovascular;  Laterality: N/A;  . Cardiac catheterization      cardiac stent  . Port-a-cath removal Left 12/07/2012    Procedure: REMOVAL PORT-A-CATH;  Surgeon: Scherry Ran, MD;  Location: [facility];  Service: General;  Laterality: Left;  . Portacath placement N/A 12/07/2012    Procedure: INSERTION PORT-A-CATH;  Surgeon: Scherry Ran, MD;  Location: [facility];  Service: General;  Laterality: N/A;  Attempted portacath placement on left and right side  .  Cataract extraction w/phaco Left 02/13/2014    Procedure: CATARACT EXTRACTION PHACO AND INTRAOCULAR LENS PLACEMENT (IOC);  Surgeon: Tonny Branch, MD;  Location: [facility];  Service: Ophthalmology;  Laterality: Left;  CDE:  7.67  . Cataract extraction w/phaco Right 03/02/2014    Procedure: CATARACT EXTRACTION PHACO AND INTRAOCULAR LENS PLACEMENT RIGHT EYE CDE=16.81;  Surgeon: Tonny Branch, MD;  Location: [facility];  Service: Ophthalmology;  Laterality: Right;    Denies any headaches, dizziness, double vision, fevers, chills, night sweats, nausea, vomiting, diarrhea, constipation, chest pain, heart palpitations, shortness of breath, blood in stool, black tarry stool, urinary pain, urinary burning, urinary frequency, hematuria.   PHYSICAL EXAMINATION  ECOG PERFORMANCE STATUS: 1 - Symptomatic but completely ambulatory  Filed Vitals:   12/12/14 1422  BP: 96/58  Pulse: 56  Temp: 98.5 F (36.9 C)  Resp: 16    GENERAL:alert, no distress, well nourished, well developed, comfortable, cooperative, obese, smiling and accompanied by wife. SKIN: skin color, texture, turgor are normal, no rashes or significant lesions HEAD: Normocephalic, No masses, lesions, tenderness or abnormalities EYES: normal, PERRLA, EOMI, Conjunctiva are pink and non-injected EARS: External ears normal OROPHARYNX:lips, buccal mucosa, and tongue normal and mucous membranes are moist.  Small hard 0.5 cm lesion/bone on the medial aspect of right lower gum line, posterior to his last tooth. Lesion appears to be exposed bone. Image follows below, but is poor. In the picture, the area of question is superior to my gloved finger, to the rigth of his last tooth   NECK: supple, trachea midline LYMPH:  not examined BREAST:not examined LUNGS: not examined HEART: not examined ABDOMEN:obese BACK: Back symmetric, no curvature. EXTREMITIES:less then 2 second capillary refill, no joint deformities, effusion, or inflammation, no skin  discoloration, no cyanosis, left foot boot NEURO: alert & oriented x 3 with fluent speech, no focal motor/sensory deficits, gait normal with 4 legged cane    LABORATORY DATA: CBC    Component Value Date/Time   WBC 8.9 11/22/2014 1134   RBC 3.48* 11/22/2014 1134   HGB 10.7* 11/22/2014 1134   HCT 32.7* 11/22/2014  1134   PLT 145* 11/22/2014 1134   MCV 94.0 11/22/2014 1134   MCH 30.7 11/22/2014 1134   MCHC 32.7 11/22/2014 1134   RDW 15.7* 11/22/2014 1134   LYMPHSABS 2.6 11/22/2014 1134   MONOABS 0.9 11/22/2014 1134   EOSABS 0.3 11/22/2014 1134   BASOSABS 0.0 11/22/2014 1134      Chemistry      Component Value Date/Time   NA 138 11/22/2014 1134   K 3.8 11/22/2014 1134   CL 110 11/22/2014 1134   CO2 21* 11/22/2014 1134   BUN 30* 11/22/2014 1134   CREATININE 1.74* 11/22/2014 1134   CREATININE 2.24* 10/05/2012 1411      Component Value Date/Time   CALCIUM 9.0 11/22/2014 1134   CALCIUM 6.8* 08/15/2013 1217   ALKPHOS 93 11/22/2014 1134   AST 35 11/22/2014 1134   ALT 56 11/22/2014 1134   BILITOT 0.9 11/22/2014 1134        PENDING LABS:   RADIOGRAPHIC STUDIES:  Ct Maxillofacial Wo Cm  12/05/2014   CLINICAL DATA:  Right jaw pain for 2 weeks. Multiple myeloma diagnosed 2012. Prior bisphosphonate therapy, query mandibular osteonecrosis.  EXAM: CT MAXILLOFACIAL WITHOUT CONTRAST  TECHNIQUE: Multidetector CT imaging of the maxillofacial structures was performed. Multiplanar CT image reconstructions were also generated. A small metallic BB was placed on the right temple in order to reliably differentiate right from left.  COMPARISON:  None.  FINDINGS: Mild chronic right maxillary sinusitis.  Dental cavity along the mesial side of tooth number 15.  On images 53 through 66 of series 7, 1 could appreciate mild sclerosis in the right mandibular body compared to the left, with some heterogeneity of the trabeculation. There is a small spur like projection of bone along the dental margin of  the right mandible distal to the right mandibular molars. No sequestrum or fracture.  IMPRESSION: 1. Mild but asymmetric sclerosis in the right mandibular ramus and adjacent body compared to the left, without sequestrum or fracture. This could be a manifestation of very early or mild AVN. 2. Dental cavity, mesial side of tooth 15. 3. Mild chronic right maxillary sinusitis.   Electronically Signed   By: Van Clines M.D.   On: 12/05/2014 14:53     PATHOLOGY:    ASSESSMENT AND PLAN:  Multiple myeloma Peripherally, very stable on maintenance [name].  Now with right mandibular, intraoral lesion/bone exposure posterior to last tooth.  CT imaging performed suggesting AVN.  Zometa was last given in April 2016.  It is now discontinued for the time being.  Referral to Dr. Enrique Sack for evaluation/management  Return in 3-4 weeks for follow-up and labs.  Weight gain can be discussed in follow-up.    THERAPY PLAN:  Zometa discontinued at this time.  Continue Rd therapy as outlined above.  Referral to Dr. Enrique Sack.  All questions were answered. The patient knows to call the clinic with any problems, questions or concerns. We can certainly see the patient much sooner if necessary.  Patient and plan discussed with Dr. Ancil Linsey and she is in agreement with the aforementioned.   This note is electronically signed by: Doy Mince 12/12/2014 3:51 PM

## 2014-12-12 NOTE — Assessment & Plan Note (Signed)
Peripherally, very stable on maintenance [name].  Now with right mandibular, intraoral lesion/bone exposure posterior to last tooth.  CT imaging performed suggesting AVN.  Zometa was last given in April 2016.  It is now discontinued for the time being.  Referral to Dr. Enrique Sack for evaluation/management  Return in 3-4 weeks for follow-up and labs.  Weight gain can be discussed in follow-up.

## 2014-12-13 ENCOUNTER — Other Ambulatory Visit (HOSPITAL_COMMUNITY): Payer: Self-pay | Admitting: Oncology

## 2014-12-13 ENCOUNTER — Encounter (HOSPITAL_COMMUNITY): Payer: Self-pay | Admitting: Lab

## 2014-12-13 NOTE — Progress Notes (Signed)
Referral sent to Dr Romie Minus and [facility]/  Records faxed on 7/13.   They will contact the patient with appt time.

## 2014-12-13 NOTE — Progress Notes (Signed)
Great!  Thank you for reviewing this nice man's chart and providing recommendations.  I will pursue referral accordingly.  Thank you  [name]

## 2014-12-14 ENCOUNTER — Other Ambulatory Visit (HOSPITAL_COMMUNITY): Payer: Self-pay | Admitting: Oncology

## 2014-12-20 ENCOUNTER — Other Ambulatory Visit (HOSPITAL_COMMUNITY): Payer: Self-pay | Admitting: Oncology

## 2014-12-20 ENCOUNTER — Other Ambulatory Visit (INDEPENDENT_AMBULATORY_CARE_PROVIDER_SITE_OTHER): Payer: Self-pay | Admitting: Internal Medicine

## 2014-12-20 DIAGNOSIS — C9000 Multiple myeloma not having achieved remission: Secondary | ICD-10-CM

## 2014-12-20 MED ORDER — LENALIDOMIDE 5 MG PO CAPS: 5.0000 mg | ORAL_CAPSULE | Freq: Every day | ORAL | Status: DC

## 2014-12-26 ENCOUNTER — Other Ambulatory Visit (HOSPITAL_COMMUNITY): Payer: Self-pay | Admitting: Oncology

## 2015-01-04 ENCOUNTER — Encounter (HOSPITAL_COMMUNITY): Payer: Managed Care, Other (non HMO)

## 2015-01-04 ENCOUNTER — Ambulatory Visit (HOSPITAL_COMMUNITY): Payer: Managed Care, Other (non HMO) | Admitting: Hematology & Oncology

## 2015-01-12 ENCOUNTER — Other Ambulatory Visit: Payer: Self-pay | Admitting: Internal Medicine

## 2015-01-15 ENCOUNTER — Encounter (HOSPITAL_COMMUNITY): Payer: Self-pay | Admitting: Hematology & Oncology

## 2015-01-15 ENCOUNTER — Encounter (HOSPITAL_COMMUNITY): Payer: Managed Care, Other (non HMO) | Attending: Oncology | Admitting: Hematology & Oncology

## 2015-01-15 VITALS — BP 113/64 | HR 45 | Temp 98.5°F | Resp 16 | Wt 353.2 lb

## 2015-01-15 DIAGNOSIS — C9000 Multiple myeloma not having achieved remission: Secondary | ICD-10-CM | POA: Insufficient documentation

## 2015-01-15 NOTE — Patient Instructions (Signed)
[place] at [facility] Discharge Instructions  RECOMMENDATIONS MADE BY THE CONSULTANT AND ANY TEST RESULTS WILL BE SENT TO YOUR REFERRING PHYSICIAN.  Exam and discussion by Dr. Whitney Muse Lab are stable. Referral to orthodontist No changes today Call with concerns or issues.  Follow-up  In 2 months with labs week before office visit.  Thank you for choosing [place] at [facility] to provide your oncology and hematology care.  To afford each patient quality time with our provider, please arrive at least 15 minutes before your scheduled appointment time.    You need to re-schedule your appointment should you arrive 10 or more minutes late.  We strive to give you quality time with our providers, and arriving late affects you and other patients whose appointments are after yours.  Also, if you no show three or more times for appointments you may be dismissed from the clinic at the providers discretion.     Again, thank you for choosing [facility].  Our hope is that these requests will decrease the amount of time that you wait before being seen by our physicians.       _____________________________________________________________  Should you have questions after your visit to [name], please contact our office at [phone] between the hours of 8:30 a.m. and 4:30 p.m.  Voicemails left after 4:30 p.m. will not be returned until the following business day.  For prescription refill requests, have your pharmacy contact our office.

## 2015-01-15 NOTE — Progress Notes (Signed)
[name], MD [address] [place] [address]    DIAGNOSIS:  Myeloma, kappa light chain, Stage II disease (no evidence of monoclonal protein in serum Evaluation at [facility] for transplant, felt not to be a good transplant candidate Stage III CKD 3 g proteinuria at diagnosis, Bence-Jones proteins  BMBX at diagnosis with 19% plasma cells, kappa restricted  BMBX 09/28/2014 showing variably cellular marrow with trilineage hematopoiesis and 2% plasma cells. IHC confirms the presence of a minor plasma cell component which appears to show polyclonal staining pattern per And lambda light chain. Latter appearance is nonspecific and not diagnostic of a plasma cell neoplasm. Background shows trilineage hematopoiesis with nonspecific changes.  CT maxillofacial without contrast 12/05/2014 with mild by asymmetric sclerosis in the R mandibular ramus and adjacent body compared tot the left, without sequestrum or fracture. Could be a manifestation of very early or mild AVN.  CURRENT THERAPY: Lenalidomide 5 mg 7 days on and 7 days off with 20 mg Dexamethasone BID every Friday only.  Last zometa 09/07/2014  INTERVAL HISTORY: [name] 54 y.o. male returns for follow-up of myeloma.  The patient is present with his wife.  Today is his birthday.  He says that life is wonderful. He has complaints about tenderness in his jaw.  He saw his primary dentist who could not offer him treatment for his potential AVN of the jaw. Dr. Drexel Iha referred him to another specialist but the patient notes he has not had an appointment.   He is also fatigued and experiencing diarrhea.  This occurs with certain of his medications. This is chronic. He has an appointment with Dr. Laural Golden coming up.  His Podiatrist has prescribed him an antibiotic due to his foot being infected again.  He is wearing a boot. His appetite is well and he has no other concerns at this time.    MEDICAL HISTORY: Past Medical History    Diagnosis Date  . Arteriosclerotic cardiovascular disease (ASCVD)     MI-2000s; stent to the proximal LAD and diagonal in 2001; stress nuclear in 2008-impaired exercise capacity, left ventricular dilatation, moderately to severely depressed EF, apical, inferior and anteroseptal scar  . Hypertension   . 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 Diabetes mellitus     Insulin  . GERD (gastroesophageal reflux disease)   . Pedal edema     Venous insufficiency  . Obesity   . Hyperlipidemia   . Injection site reaction   . Cellulitis of leg     both legs  . Chronic kidney disease, stage 3, mod decreased GFR     Creatinine of 1.84 in 06/2011 and 1.5 in 07/2011  . Chronic diarrhea   . Sleep apnea     uses cpap  . Ulcer   . Atrial flutter   . Multiple myeloma 07/01/2011  . Myocardial infarction 2000  . Gout     has DIABETES MELLITUS, TYPE II, ON INSULIN; Morbid obesity; SLEEP APNEA; Multiple myeloma; Arteriosclerotic cardiovascular disease (ASCVD); Hypertension; Hyperlipidemia; DDD (degenerative disc disease), cervical; Atrial flutter; Chronic pancreatitis; Anemia, normocytic normochromic; and Chronic systolic heart failure on his problem list.     has No Known Allergies.  Mr. Dobie does not currently have medications on file.  SURGICAL HISTORY: Past Surgical History  Procedure Laterality Date  . Laparoscopic gastric banding  2006    has been removed  . Wrist surgery      Left; removal of bone fragment  . Incision and  drainage abscess anal    . Abscess drainage      Scrotal  . Bone marrow biopsy  05/13/11  . Portacath placement  07/07/2011    Procedure: INSERTION PORT-A-CATH;  Surgeon: Scherry Ran, MD;  Location: [facility];  Service: General;  Laterality: N/A;  . Colonoscopy  11/28/2011    Procedure: COLONOSCOPY;  Surgeon: Rogene Houston, MD;  Location: [facility];  Service: Endoscopy;  Laterality: N/A;  930  . Esophagogastroduodenoscopy  01/02/2012    Procedure:  ESOPHAGOGASTRODUODENOSCOPY (EGD);  Surgeon: Rogene Houston, MD;  Location: [facility];  Service: Endoscopy;  Laterality: N/A;  100  . Esophageal biopsy  01/02/2012    Procedure: BIOPSY;  Surgeon: Rogene Houston, MD;  Location: [facility];  Service: Endoscopy;  Laterality: N/A;  . Esophagogastroduodenoscopy N/A 09/20/2012    Procedure: ESOPHAGOGASTRODUODENOSCOPY (EGD);  Surgeon: Rogene Houston, MD;  Location: [facility];  Service: Endoscopy;  Laterality: N/A;  . Eus N/A 10/07/2012    Procedure: UPPER ENDOSCOPIC ULTRASOUND (EUS) LINEAR;  Surgeon: Milus Banister, MD;  Location: [facility];  Service: Endoscopy;  Laterality: N/A;  . Cardioversion N/A 10/13/2012    Procedure: CARDIOVERSION;  Surgeon: Yehuda Savannah, MD;  Location: [facility];  Service: Cardiovascular;  Laterality: N/A;  . Cardiac catheterization      cardiac stent  . Port-a-cath removal Left 12/07/2012    Procedure: REMOVAL PORT-A-CATH;  Surgeon: Scherry Ran, MD;  Location: [facility];  Service: General;  Laterality: Left;  . Portacath placement N/A 12/07/2012    Procedure: INSERTION PORT-A-CATH;  Surgeon: Scherry Ran, MD;  Location: [facility];  Service: General;  Laterality: N/A;  Attempted portacath placement on left and right side  . Cataract extraction w/phaco Left 02/13/2014    Procedure: CATARACT EXTRACTION PHACO AND INTRAOCULAR LENS PLACEMENT (IOC);  Surgeon: Tonny Branch, MD;  Location: [facility];  Service: Ophthalmology;  Laterality: Left;  CDE:  7.67  . Cataract extraction w/phaco Right 03/02/2014    Procedure: CATARACT EXTRACTION PHACO AND INTRAOCULAR LENS PLACEMENT RIGHT EYE CDE=16.81;  Surgeon: Tonny Branch, MD;  Location: [facility];  Service: Ophthalmology;  Laterality: Right;    SOCIAL HISTORY: Social History   Social History  . Marital Status: Married    Spouse Name: N/A  . Number of Children: N/A  . Years of Education: 16   Occupational History  . Nurse, learning disability   . Funeral Director    Social History  Main Topics  . Smoking status: Former Smoker -- 0.25 packs/day for 1 years    Types: Cigarettes, Cigars    Quit date: 05/17/2001  . Smokeless tobacco: Never Used  . Alcohol Use: No  . Drug Use: No  . Sexual Activity: Yes    Birth Control/ Protection: None   Other Topics Concern  . Not on file   Social History Narrative    FAMILY HISTORY: Family History  Problem Relation Age of Onset  . Heart disease Mother   . Cancer Mother   . Diabetes Father   . Anesthesia problems Neg Hx   . Hypotension Neg Hx   . Malignant hyperthermia Neg Hx   . Pseudochol deficiency Neg Hx   . Arthritis      Review of Systems  Constitutional: Negative.   HENT: Negative.   Eyes: Negative.   Respiratory: Negative. Cardiovascular: Negative.   Gastrointestinal: Positive for diarrhea .   Genitourinary: Negative.   Musculoskeletal: Positive for joint pain.  Muscle spasms in hands.       Left foot non healing ulcer. Will be seeing [facility], Dr. Zada Girt Skin: Negative.   Neurological: Negative.  Psychiatric/Behavioral: Negative.   14 point review of systems was performed and is negative except as detailed under history of present illness and above   PHYSICAL EXAMINATION   ECOG PERFORMANCE STATUS: 1 - Symptomatic but completely ambulatory  Filed Vitals:   01/15/15 1042  BP: 113/64  Pulse: 45  Temp: 98.5 F (36.9 C)  Resp: 16    Physical Exam  Constitutional: He is oriented to person, place, and time and well-developed, well-nourished, and in no distress. Obese HENT:  Head: Normocephalic and atraumatic.  Nose: Nose normal.  Mouth/Throat: Oropharynx is clear and moist. No oropharyngeal exudate.  Eyes: Conjunctivae and EOM are normal. Pupils are equal, round, and reactive to light. Right eye exhibits no discharge. Left eye exhibits no discharge. No scleral icterus.  Neck: Normal range of motion. Neck supple. No tracheal deviation present. No thyromegaly present.    Cardiovascular: Normal rate, regular rhythm and normal heart sounds.  Exam reveals no gallop and no friction rub.   No murmur heard. Pulmonary/Chest: Effort normal and breath sounds normal. He has no wheezes. He has no rales.  Abdominal: Soft. Bowel sounds are normal. He exhibits no distension and no mass. There is no tenderness. There is no rebound and no guarding.  Musculoskeletal: Normal range of motion. He exhibit edema. L foot in a foot brace Lymphadenopathy:    He has no cervical adenopathy.  Neurological: He is alert and oriented to person, place, and time. He has normal reflexes. No cranial nerve deficit. Gait normal. Coordination normal.  Skin: Skin is warm and dry. No rash noted.  Psychiatric: Mood, memory, affect and judgment normal.  Nursing note and vitals reviewed.   LABORATORY DATA:  CBC    Component Value Date/Time   WBC 8.9 11/22/2014 1134   RBC 3.48* 11/22/2014 1134   HGB 10.7* 11/22/2014 1134   HCT 32.7* 11/22/2014 1134   PLT 145* 11/22/2014 1134   MCV 94.0 11/22/2014 1134   MCH 30.7 11/22/2014 1134   MCHC 32.7 11/22/2014 1134   RDW 15.7* 11/22/2014 1134   LYMPHSABS 2.6 11/22/2014 1134   MONOABS 0.9 11/22/2014 1134   EOSABS 0.3 11/22/2014 1134   BASOSABS 0.0 11/22/2014 1134   CMP     Component Value Date/Time   NA 138 11/22/2014 1134   K 3.8 11/22/2014 1134   CL 110 11/22/2014 1134   CO2 21* 11/22/2014 1134   GLUCOSE 195* 11/22/2014 1134   BUN 30* 11/22/2014 1134   CREATININE 1.74* 11/22/2014 1134   CREATININE 2.24* 10/05/2012 1411   CALCIUM 9.0 11/22/2014 1134   CALCIUM 6.8* 08/15/2013 1217   PROT 5.9* 11/22/2014 1134   ALBUMIN 3.2* 11/22/2014 1134   AST 35 11/22/2014 1134   ALT 56 11/22/2014 1134   ALKPHOS 93 11/22/2014 1134   BILITOT 0.9 11/22/2014 1134   GFRNONAA 42* 11/22/2014 1134   GFRAA 49* 11/22/2014 1134     ASSESSMENT and THERAPY PLAN:   Multiple myeloma, light chain disease Evaluation at [facility] for transplant, felt not to be a  good transplant candidate Stage III CKD Diabetes Non healing L foot ulcer  Discontinued his Zometa because of the concern of osteonecrosis of the jaw. We are working on getting him referred to an appropriate provider for further evaluation and management. His last dose of Zometa was in April.  I had offered him discontinuation of [name] since his counts were remaining so suppressed and his M spike is not detectable and bone marrow biopsy showed less than 2% plasma cells. He has opted to continue on therapy. I do not feel that he needs to continue on dexamethasone but again he feels uncomfortable discontinuing therapy because of his mother's death from myeloma.  I have recommended follow-up in 8 weeks with repeat laboratory studies. He feels comfortable with that. He has any interim problems prior to follow-up of advised him to let us know.  All questions were answered. The patient knows to call the clinic with any problems, questions or concerns. We can certainly see the patient much sooner if necessary.   This note was electronically signed.  This document serves as a record of services personally performed by Ancil Linsey, MD. It was created on her behalf by Janace Hoard, a trained medical scribe. The creation of this record is based on the scribe's personal observations and the provider's statements to them. This document has been checked and approved by the attending provider.  I have reviewed the above documentation for accuracy and completeness and I agree with the above.  Patrici Ranks, MD

## 2015-01-16 ENCOUNTER — Ambulatory Visit (INDEPENDENT_AMBULATORY_CARE_PROVIDER_SITE_OTHER): Payer: 59 | Admitting: Internal Medicine

## 2015-01-16 ENCOUNTER — Encounter (INDEPENDENT_AMBULATORY_CARE_PROVIDER_SITE_OTHER): Payer: Self-pay | Admitting: Internal Medicine

## 2015-01-16 VITALS — BP 108/76 | HR 72 | Temp 99.0°F | Resp 18 | Ht 72.0 in | Wt 348.7 lb

## 2015-01-16 DIAGNOSIS — K8020 Calculus of gallbladder without cholecystitis without obstruction: Secondary | ICD-10-CM

## 2015-01-16 DIAGNOSIS — R1010 Upper abdominal pain, unspecified: Secondary | ICD-10-CM | POA: Diagnosis not present

## 2015-01-16 DIAGNOSIS — K904 Malabsorption due to intolerance, not elsewhere classified: Secondary | ICD-10-CM

## 2015-01-16 DIAGNOSIS — G8929 Other chronic pain: Secondary | ICD-10-CM

## 2015-01-16 DIAGNOSIS — K760 Fatty (change of) liver, not elsewhere classified: Secondary | ICD-10-CM

## 2015-01-16 DIAGNOSIS — K909 Intestinal malabsorption, unspecified: Secondary | ICD-10-CM

## 2015-01-16 DIAGNOSIS — R1011 Right upper quadrant pain: Secondary | ICD-10-CM

## 2015-01-16 MED ORDER — PANCRELIPASE (LIP-PROT-AMYL) 36000-114000 UNITS PO CPEP: ORAL_CAPSULE | ORAL | Status: DC

## 2015-01-16 MED ORDER — DICYCLOMINE HCL 10 MG PO CAPS: 10.0000 mg | ORAL_CAPSULE | Freq: Three times a day (TID) | ORAL | Status: DC

## 2015-01-16 NOTE — Progress Notes (Signed)
Presenting complaint;  Follow-up for steatorrhea abdominal pain and bloating.  Subjective:  Patient is 54 year old African-American male who has steatorrhea and upper abdominal pain is here for scheduled visit. He was last seen 4 months ago. He is maintaining his weight. He is having at least 3-5 stools per day. He is having nocturnal bowel movement at least 4 nights a week. He has occasional solid stool. On one or 2 occasions his stool was hard and he felt he was constipated. He's never skipped a day. He continues to complain of bloating which is worse towards the end of the day. He remains with intermittent right upper quadrant pain which last for 5-15 minutes. Pain resolved spontaneously and is not associated fever chills nausea or vomiting. He has not taken nitroglycerin recently. He remains on Decadron and [name] for maintenance of multiple myeloma. He works at least 40 hours a week. States his appetite is good. He would like to get a prescription for dicyclomine. He is taking Creon 36K four with each meal and two with each snack.   Current Medications: Outpatient Encounter Prescriptions as of 01/16/2015  Medication Sig  . acyclovir (ZOVIRAX) 400 MG tablet TAKE ONE TABLET BY MOUTH EVERY MORNING.  Marland Kitchen allopurinol (ZYLOPRIM) 300 MG tablet TAKE ONE TABLET BY MOUTH ONCE DAILY.  Marland Kitchen atorvastatin (LIPITOR) 80 MG tablet TAKE ONE TABLET BY MOUTH AT BEDTIME.  . calcitRIOL (ROCALTROL) 0.25 MCG capsule Take 0.25 mcg by mouth daily.  . calcium carbonate (OS-CAL) 600 MG TABS tablet Take 1,800 mg by mouth 2 (two) times daily.   Marland Kitchen CREON 36000 UNITS CPEP capsule TAKE 4 CAPSULES BY MOUTH DAILY WITH MEALS AND 2 CAPSULES WITH SNACKS.  Marland Kitchen dexamethasone (DECADRON) 4 MG tablet TAKE 5 TABLETS BY MOUTH 2 TIMES A DAY EVERY FRIDAY.  Marland Kitchen dexlansoprazole (DEXILANT) 60 MG capsule Take 60 mg by mouth every morning.   . diphenoxylate-atropine (LOMOTIL) 2.5-0.025 MG per tablet Take 2 tablets by mouth 2 (two) times daily.  Marland Kitchen  HYDROcodone-acetaminophen (NORCO/VICODIN) 5-325 MG per tablet Take 1-2 tablets by mouth every 4 (four) hours as needed for moderate pain or severe pain.  Marland Kitchen insulin detemir (LEVEMIR) 100 UNIT/ML injection Inject 40 Units into the skin every morning.   . insulin lispro (HUMALOG) 100 UNIT/ML injection Inject 5-30 Units into the skin 2 (two) times daily with a meal. Sliding Scale per patient  . lenalidomide ([name]) 5 MG capsule Take 1 capsule (5 mg total) by mouth daily. Take 1 tablet PO 7 days on and 7 days off.  . lisinopril (PRINIVIL,ZESTRIL) 5 MG tablet Take 5 mg by mouth every other day.  . loratadine (CLARITIN) 10 MG tablet Take 10 mg by mouth every morning.   . metoprolol succinate (TOPROL-XL) 25 MG 24 hr tablet TAKE ONE TABLET BY MOUTH ONCE DAILY.  . Multiple Vitamins-Minerals (MULTIVITAMINS THER. W/MINERALS) TABS Take 1 tablet by mouth every morning.   . nitroGLYCERIN (NITROSTAT) 0.4 MG SL tablet Place 1 tablet (0.4 mg total) under the tongue every 5 (five) minutes as needed for chest pain.  . potassium chloride SA (K-DUR,KLOR-CON) 20 MEQ tablet TAKE 2 TABLETS BY MOUTH TWICE DAILY.  . sodium bicarbonate 650 MG tablet Take 1,300 mg by mouth 3 (three) times daily.  Marland Kitchen sulfamethoxazole-trimethoprim (BACTRIM DS,SEPTRA DS) 800-160 MG per tablet TAKE 1 TABLET BY MOUTH EVERY MONDAY, WEDNESDAY, AND FRIDAY.  Marland Kitchen torsemide (DEMADEX) 20 MG tablet TAKE 2 TABLETS BY MOUTH EACH MORNING AND 1 TABLET EACH EVENING.  Marland Kitchen UNKNOWN TO PATIENT Taking antibiotic  twice daily for infection in left great toe  . Vitamin D, Ergocalciferol, (DRISDOL) 50000 UNITS CAPS capsule TAKE 1 CAPSULE BY MOUTH WEEKLY FOR 4 WEEKS; THEN TAKE 1 CAPSULE MONTHLY THEREAFTER.  Alveda Reasons 20 MG TABS tablet TAKE 1 TABLET BY MOUTH DAILY.  Marland Kitchen dicyclomine (BENTYL) 10 MG capsule Take 1 capsule (10 mg total) by mouth 3 (three) times daily before meals. (Patient not taking: Reported on 01/16/2015)   Facility-Administered Encounter Medications as of  01/16/2015  Medication  . sodium chloride 0.9 % injection 10 mL     Objective: Blood pressure 108/76, pulse 72, temperature 99 F (37.2 C), temperature source Oral, resp. rate 18, height 6' (1.829 m), weight 348 lb 11.2 oz (158.169 kg). Patient is alert and in no acute distress. Conjunctiva is pink. Sclera is nonicteric Oropharyngeal mucosa is normal. No neck masses or thyromegaly noted. Cardiac exam with regular rhythm normal S1 and S2. No murmur or gallop noted. Lungs are clear to auscultation. Abdomen is full. Bowel sounds are normal. On palpation abdomen is soft with mild midepigastric tenderness. Percussion over this area is tympanitic. No organomegaly or masses. He has 2+ pitting edema involving both legs.    Labs/studies Results: AST and ALT were 56 and 78 on 09/07/2014  AST and ALT were 35 and 56 on 11/22/2014.   Assessment:  #1. Steatorrhea. He has had partial response to therapy. There is room for pancreatic enzyme dose to be increased which is based on body weight. Stool frequency has decreased and consistency has improved but still not normal. He is maintaining his weight. #2. Right upper quadrant abdominal pain most likely secondary to cholelithiasis. He has not made up his mind about cholecystectomy. #3. Bloating secondary to 1 and 2. #4. Mildly elevated transaminases second defect to liver.   Plan:  Dicyclomine 10 mg by mouth before meals. Increase Creon 36K as follows. Take 4 with breakfast 5 to 6 with lunch and dinner and 2 with each snack. Patient will call with progress report in a few weeks . Patient also instructed to call office if he has prolonged right upper quadrant pain associated fever nausea or vomiting. Office visit in 4 months.

## 2015-01-16 NOTE — Patient Instructions (Addendum)
Please call office with progress report in 4 weeks. Notify if you have right upper quadrant pain with nausea vomiting or fever.

## 2015-01-18 ENCOUNTER — Other Ambulatory Visit (HOSPITAL_COMMUNITY): Payer: Self-pay | Admitting: Oncology

## 2015-01-18 DIAGNOSIS — C9000 Multiple myeloma not having achieved remission: Secondary | ICD-10-CM

## 2015-01-18 MED ORDER — LENALIDOMIDE 5 MG PO CAPS: 5.0000 mg | ORAL_CAPSULE | Freq: Every day | ORAL | Status: DC

## 2015-01-30 ENCOUNTER — Other Ambulatory Visit (HOSPITAL_COMMUNITY): Payer: Self-pay | Admitting: Oncology

## 2015-02-19 ENCOUNTER — Other Ambulatory Visit (HOSPITAL_COMMUNITY): Payer: Self-pay | Admitting: Oncology

## 2015-02-19 DIAGNOSIS — C9000 Multiple myeloma not having achieved remission: Secondary | ICD-10-CM

## 2015-02-19 MED ORDER — LENALIDOMIDE 5 MG PO CAPS: 5.0000 mg | ORAL_CAPSULE | Freq: Every day | ORAL | Status: DC

## 2015-02-21 ENCOUNTER — Ambulatory Visit (HOSPITAL_COMMUNITY): Payer: Managed Care, Other (non HMO) | Admitting: Hematology & Oncology

## 2015-02-25 ENCOUNTER — Emergency Department (HOSPITAL_COMMUNITY): Payer: Managed Care, Other (non HMO)

## 2015-02-25 ENCOUNTER — Encounter (HOSPITAL_COMMUNITY): Payer: Self-pay | Admitting: Emergency Medicine

## 2015-02-25 ENCOUNTER — Emergency Department (HOSPITAL_COMMUNITY)
Admission: EM | Admit: 2015-02-25 | Discharge: 2015-02-25 | Disposition: A | Discharge status: Home / Self Care | Payer: Managed Care, Other (non HMO) | Attending: Emergency Medicine | Admitting: Emergency Medicine

## 2015-02-25 DIAGNOSIS — I252 Old myocardial infarction: Secondary | ICD-10-CM | POA: Diagnosis not present

## 2015-02-25 DIAGNOSIS — M109 Gout, unspecified: Secondary | ICD-10-CM | POA: Insufficient documentation

## 2015-02-25 DIAGNOSIS — Z9981 Dependence on supplemental oxygen: Secondary | ICD-10-CM | POA: Insufficient documentation

## 2015-02-25 DIAGNOSIS — Z87891 Personal history of nicotine dependence: Secondary | ICD-10-CM | POA: Diagnosis not present

## 2015-02-25 DIAGNOSIS — E1165 Type 2 diabetes mellitus with hyperglycemia: Secondary | ICD-10-CM | POA: Insufficient documentation

## 2015-02-25 DIAGNOSIS — R079 Chest pain, unspecified: Secondary | ICD-10-CM | POA: Insufficient documentation

## 2015-02-25 DIAGNOSIS — E785 Hyperlipidemia, unspecified: Secondary | ICD-10-CM | POA: Diagnosis not present

## 2015-02-25 DIAGNOSIS — K8020 Calculus of gallbladder without cholecystitis without obstruction: Secondary | ICD-10-CM | POA: Diagnosis not present

## 2015-02-25 DIAGNOSIS — Z872 Personal history of diseases of the skin and subcutaneous tissue: Secondary | ICD-10-CM | POA: Diagnosis not present

## 2015-02-25 DIAGNOSIS — R739 Hyperglycemia, unspecified: Secondary | ICD-10-CM

## 2015-02-25 DIAGNOSIS — Z79899 Other long term (current) drug therapy: Secondary | ICD-10-CM | POA: Diagnosis not present

## 2015-02-25 DIAGNOSIS — I4891 Unspecified atrial fibrillation: Secondary | ICD-10-CM | POA: Insufficient documentation

## 2015-02-25 DIAGNOSIS — N289 Disorder of kidney and ureter, unspecified: Secondary | ICD-10-CM | POA: Insufficient documentation

## 2015-02-25 DIAGNOSIS — E669 Obesity, unspecified: Secondary | ICD-10-CM | POA: Insufficient documentation

## 2015-02-25 DIAGNOSIS — K219 Gastro-esophageal reflux disease without esophagitis: Secondary | ICD-10-CM | POA: Insufficient documentation

## 2015-02-25 DIAGNOSIS — Z794 Long term (current) use of insulin: Secondary | ICD-10-CM | POA: Diagnosis not present

## 2015-02-25 DIAGNOSIS — G4730 Sleep apnea, unspecified: Secondary | ICD-10-CM | POA: Insufficient documentation

## 2015-02-25 DIAGNOSIS — I129 Hypertensive chronic kidney disease with stage 1 through stage 4 chronic kidney disease, or unspecified chronic kidney disease: Secondary | ICD-10-CM | POA: Diagnosis not present

## 2015-02-25 DIAGNOSIS — N183 Chronic kidney disease, stage 3 (moderate): Secondary | ICD-10-CM | POA: Insufficient documentation

## 2015-02-25 DIAGNOSIS — K449 Diaphragmatic hernia without obstruction or gangrene: Secondary | ICD-10-CM | POA: Insufficient documentation

## 2015-02-25 LAB — BASIC METABOLIC PANEL
Anion gap: 7 (ref 5–15)
BUN: 47 mg/dL — ABNORMAL HIGH (ref 6–20)
CO2: 27 mmol/L (ref 22–32)
Calcium: 8.9 mg/dL (ref 8.9–10.3)
Chloride: 106 mmol/L (ref 101–111)
Creatinine, Ser: 1.84 mg/dL — ABNORMAL HIGH (ref 0.61–1.24)
GFR calc Af Amer: 46 mL/min — ABNORMAL LOW (ref >60)
GFR calc non Af Amer: 39 mL/min — ABNORMAL LOW (ref >60)
Glucose, Bld: 213 mg/dL — ABNORMAL HIGH (ref 65–99)
Potassium: 4.7 mmol/L (ref 3.5–5.1)
Sodium: 140 mmol/L (ref 135–145)

## 2015-02-25 LAB — LIPASE, BLOOD: Lipase: 293 U/L — ABNORMAL HIGH (ref 22–51)

## 2015-02-25 LAB — CBC WITH DIFFERENTIAL/PLATELET
Basophils Absolute: 0 10*3/uL (ref 0.0–0.1)
Basophils Relative: 0 %
Eosinophils Absolute: 0 10*3/uL (ref 0.0–0.7)
Eosinophils Relative: 0 %
HCT: 32.1 % — ABNORMAL LOW (ref 39.0–52.0)
Hemoglobin: 10.5 g/dL — ABNORMAL LOW (ref 13.0–17.0)
Lymphocytes Relative: 14 %
Lymphs Abs: 1.6 10*3/uL (ref 0.7–4.0)
MCH: 30.8 pg (ref 26.0–34.0)
MCHC: 32.7 g/dL (ref 30.0–36.0)
MCV: 94.1 fL (ref 78.0–100.0)
Monocytes Absolute: 2 10*3/uL — ABNORMAL HIGH (ref 0.1–1.0)
Monocytes Relative: 17 %
Neutro Abs: 7.7 10*3/uL (ref 1.7–7.7)
Neutrophils Relative %: 69 %
Platelets: 125 10*3/uL — ABNORMAL LOW (ref 150–400)
RBC: 3.41 MIL/uL — ABNORMAL LOW (ref 4.22–5.81)
RDW: 14.9 % (ref 11.5–15.5)
WBC: 11.3 10*3/uL — ABNORMAL HIGH (ref 4.0–10.5)

## 2015-02-25 LAB — HEPATIC FUNCTION PANEL
ALT: 35 U/L (ref 17–63)
AST: 20 U/L (ref 15–41)
Albumin: 3.3 g/dL — ABNORMAL LOW (ref 3.5–5.0)
Alkaline Phosphatase: 75 U/L (ref 38–126)
Bilirubin, Direct: 0.1 mg/dL (ref 0.1–0.5)
Indirect Bilirubin: 0.4 mg/dL (ref 0.3–0.9)
Total Bilirubin: 0.5 mg/dL (ref 0.3–1.2)
Total Protein: 5.8 g/dL — ABNORMAL LOW (ref 6.5–8.1)

## 2015-02-25 LAB — I-STAT TROPONIN, ED
Troponin i, poc: 0.04 ng/mL (ref 0.00–0.08)
Troponin i, poc: 0.03 ng/mL (ref 0.00–0.08)

## 2015-02-25 LAB — CBG MONITORING, ED: Glucose-Capillary: 205 mg/dL — ABNORMAL HIGH (ref 65–99)

## 2015-02-25 MED ORDER — MORPHINE SULFATE (PF) 4 MG/ML IV SOLN
4.0000 mg | Freq: Once | INTRAVENOUS | Status: AC
  Administered 2015-02-25: 4 mg via INTRAVENOUS
  Filled 2015-02-25: qty 1

## 2015-02-25 MED ORDER — IOHEXOL 300 MG/ML  SOLN
50.0000 mL | Freq: Once | INTRAMUSCULAR | Status: AC | PRN
  Administered 2015-02-25: 50 mL via ORAL

## 2015-02-25 MED ORDER — DEXLANSOPRAZOLE 60 MG PO CPDR: 60.0000 mg | DELAYED_RELEASE_CAPSULE | Freq: Every day | ORAL | Status: DC

## 2015-02-25 MED ORDER — PANTOPRAZOLE SODIUM 40 MG IV SOLR
40.0000 mg | Freq: Once | INTRAVENOUS | Status: AC
  Administered 2015-02-25: 40 mg via INTRAVENOUS
  Filled 2015-02-25: qty 40

## 2015-02-25 MED ORDER — ONDANSETRON HCL 4 MG/2ML IJ SOLN
4.0000 mg | Freq: Once | INTRAMUSCULAR | Status: AC
  Administered 2015-02-25: 4 mg via INTRAVENOUS
  Filled 2015-02-25: qty 2

## 2015-02-25 NOTE — ED Notes (Signed)
Pt states that the pain is starting to return, Dr Christy Gentles notified,

## 2015-02-25 NOTE — Discharge Instructions (Signed)
Abdominal (belly) pain can be caused by many things. any cases can be observed and treated at home after initial evaluation in the emergency department. Even though you are being discharged home, abdominal pain can be unpredictable. Therefore, you need a repeated exam if your pain does not resolve, returns, or worsens. Most patients with abdominal pain don't have to be admitted to the hospital or have surgery, but serious problems like appendicitis and gallbladder attacks can start out as nonspecific pain. Many abdominal conditions cannot be diagnosed in one visit, so follow-up evaluations are very important. SEEK IMMEDIATE MEDICAL ATTENTION IF: The pain does not go away or becomes severe, particularly over the next 8-12 hours.  A temperature above 100.60F develops.  Repeated vomiting occurs (multiple episodes).  The pain becomes localized to portions of the abdomen. The right side could possibly be appendicitis. In an adult, the left lower portion of the abdomen could be colitis or diverticulitis.  Blood is being passed in stools or vomit (bright red or black tarry stools).  Return also if you develop chest pain, difficulty breathing, dizziness or fainting, or become confused, poorly responsive, or inconsolable.  Your caregiver has diagnosed you as having chest pain that is not specific for one problem, but does not require admission.  Chest pain comes from many different causes.  SEEK IMMEDIATE MEDICAL ATTENTION IF: You have severe chest pain, especially if the pain is crushing or pressure-like and spreads to the arms, back, neck, or jaw, or if you have sweating, nausea (feeling sick to your stomach), or shortness of breath. THIS IS AN EMERGENCY. Don't wait to see if the pain will go away. Get medical help at once. Call 911 or [phone] (operator). DO NOT drive yourself to the hospital.  Your chest pain gets worse and does not go away with rest.  You have an attack of chest pain lasting longer than usual,  despite rest and treatment with the medications your caregiver has prescribed.  You wake from sleep with chest pain or shortness of breath.  You feel dizzy or faint.  You have chest pain not typical of your usual pain for which you originally saw your caregiver.

## 2015-02-25 NOTE — ED Notes (Signed)
Pt states that he started having pain in center of chest last night that worsened today.  States that it feels like indigestion.  States has had some sob with this pain.

## 2015-02-25 NOTE — ED Provider Notes (Signed)
CSN: [id number]     Arrival date & time 02/25/15  1355 History   First MD Initiated Contact with Patient 02/25/15 1413     Chief Complaint  Patient presents with  . Chest Pain     Patient is a 54 y.o. male presenting with chest pain. The history is provided by the patient.  Chest Pain Pain location:  Substernal area and epigastric Pain quality: burning   Pain radiates to:  Does not radiate Pain severity:  Moderate Onset quality:  Gradual Duration:  12 hours Timing:  Constant Progression:  Worsening Chronicity:  Recurrent Relieved by:  Nothing Worsened by:  Certain positions Associated symptoms: nausea and shortness of breath    Patient presents with CP/epigastric pain/burning since last night It is worsened today He also reports feeling indigestion He reports having this pain before He reports it is not similar to prior heart attacks He reports he ran out of dexilant last week and then symptoms started He reports h/o cholelithiasis   Pt reports h/o a-fib with slow heart rate - denies weakness/syncope Past Medical History  Diagnosis Date  . Arteriosclerotic cardiovascular disease (ASCVD)     MI-2000s; stent to the proximal LAD and diagonal in 2001; stress nuclear in 2008-impaired exercise capacity, left ventricular dilatation, moderately to severely depressed EF, apical, inferior and anteroseptal scar  . Hypertension   . 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 Diabetes mellitus     Insulin  . GERD (gastroesophageal reflux disease)   . Pedal edema     Venous insufficiency  . Obesity   . Hyperlipidemia   . Injection site reaction   . Cellulitis of leg     both legs  . Chronic kidney disease, stage 3, mod decreased GFR     Creatinine of 1.84 in 06/2011 and 1.5 in 07/2011  . Chronic diarrhea   . Sleep apnea     uses cpap  . Ulcer   . Atrial flutter   . Multiple myeloma 07/01/2011  . Myocardial infarction 2000  . Gout    Past Surgical History  Procedure Laterality  Date  . Laparoscopic gastric banding  2006    has been removed  . Wrist surgery      Left; removal of bone fragment  . Incision and drainage abscess anal    . Abscess drainage      Scrotal  . Bone marrow biopsy  05/13/11  . Portacath placement  07/07/2011    Procedure: INSERTION PORT-A-CATH;  Surgeon: Scherry Ran, MD;  Location: [facility];  Service: General;  Laterality: N/A;  . Colonoscopy  11/28/2011    Procedure: COLONOSCOPY;  Surgeon: Rogene Houston, MD;  Location: [facility];  Service: Endoscopy;  Laterality: N/A;  930  . Esophagogastroduodenoscopy  01/02/2012    Procedure: ESOPHAGOGASTRODUODENOSCOPY (EGD);  Surgeon: Rogene Houston, MD;  Location: [facility];  Service: Endoscopy;  Laterality: N/A;  100  . Esophageal biopsy  01/02/2012    Procedure: BIOPSY;  Surgeon: Rogene Houston, MD;  [facility]: [facility];  Service: Endoscopy;  Laterality: N/A;  . Esophagogastroduodenoscopy N/A 09/20/2012    Procedure: ESOPHAGOGASTRODUODENOSCOPY (EGD);  Surgeon: Rogene Houston, MD;  Location: [facility];  Service: Endoscopy;  Laterality: N/A;  . Eus N/A 10/07/2012    Procedure: UPPER ENDOSCOPIC ULTRASOUND (EUS) LINEAR;  Surgeon: Milus Banister, MD;  Location: [facility];  Service: Endoscopy;  Laterality: N/A;  . Cardioversion N/A 10/13/2012    Procedure: CARDIOVERSION;  Surgeon: Cristopher Estimable  Lattie Haw, MD;  Location: [facility];  Service: Cardiovascular;  Laterality: N/A;  . Cardiac catheterization      cardiac stent  . Port-a-cath removal Left 12/07/2012    Procedure: REMOVAL PORT-A-CATH;  Surgeon: Scherry Ran, MD;  Location: [facility];  Service: General;  Laterality: Left;  . Portacath placement N/A 12/07/2012    Procedure: INSERTION PORT-A-CATH;  Surgeon: Scherry Ran, MD;  Location: [facility];  Service: General;  Laterality: N/A;  Attempted portacath placement on left and right side  . Cataract extraction w/phaco Left 02/13/2014    Procedure: CATARACT EXTRACTION PHACO AND INTRAOCULAR  LENS PLACEMENT (IOC);  Surgeon: Tonny Branch, MD;  Location: [facility];  Service: Ophthalmology;  Laterality: Left;  CDE:  7.67  . Cataract extraction w/phaco Right 03/02/2014    Procedure: CATARACT EXTRACTION PHACO AND INTRAOCULAR LENS PLACEMENT RIGHT EYE CDE=16.81;  Surgeon: Tonny Branch, MD;  Location: [facility];  Service: Ophthalmology;  Laterality: Right;   Family History  Problem Relation Age of Onset  . Heart disease Mother   . Cancer Mother   . Diabetes Father   . Anesthesia problems Neg Hx   . Hypotension Neg Hx   . Malignant hyperthermia Neg Hx   . Pseudochol deficiency Neg Hx   . Arthritis     Social History  Substance Use Topics  . Smoking status: Former Smoker -- 0.25 packs/day for 1 years    Types: Cigarettes, Cigars    Quit date: 05/17/2001  . Smokeless tobacco: Never Used  . Alcohol Use: No    Review of Systems  Respiratory: Positive for shortness of breath.   Cardiovascular: Positive for chest pain.  Gastrointestinal: Positive for nausea. Negative for blood in stool.  Neurological: Negative for syncope.  All other systems reviewed and are negative.     Allergies  Review of patient's allergies indicates no known allergies.  Home Medications   Prior to Admission medications   Medication Sig Start Date End Date Taking? Authorizing Provider  acyclovir (ZOVIRAX) 400 MG tablet TAKE ONE TABLET BY MOUTH EVERY MORNING. 10/05/14  Yes Manon Hilding Kefalas, PA-C  allopurinol (ZYLOPRIM) 300 MG tablet TAKE ONE TABLET BY MOUTH ONCE DAILY. 12/20/14  Yes Baird Cancer, PA-C  atorvastatin (LIPITOR) 80 MG tablet TAKE ONE TABLET BY MOUTH AT BEDTIME. 01/12/15  Yes Evans Lance, MD  calcitRIOL (ROCALTROL) 0.25 MCG capsule Take 0.25 mcg by mouth daily. 12/14/13  Yes Historical Provider, MD  calcium carbonate (OS-CAL) 600 MG TABS tablet Take 1,800 mg by mouth 2 (two) times daily.    Yes Historical Provider, MD  dexamethasone (DECADRON) 4 MG tablet TAKE 5 TABLETS BY MOUTH 2 TIMES A DAY EVERY  FRIDAY. 12/14/14  Yes Baird Cancer, PA-C  dexlansoprazole (DEXILANT) 60 MG capsule Take 60 mg by mouth every morning.    Yes Historical Provider, MD  dicyclomine (BENTYL) 10 MG capsule Take 1 capsule (10 mg total) by mouth 3 (three) times daily before meals. 01/16/15  Yes Rogene Houston, MD  diphenoxylate-atropine (LOMOTIL) 2.5-0.025 MG per tablet Take 2 tablets by mouth 2 (two) times daily. 09/26/14  Yes Rogene Houston, MD  HYDROcodone-acetaminophen (NORCO/VICODIN) 5-325 MG per tablet Take 1-2 tablets by mouth every 4 (four) hours as needed for moderate pain or severe pain. 12/01/14  Yes Manon Hilding Kefalas, PA-C  insulin detemir (LEVEMIR) 100 UNIT/ML injection Inject 40 Units into the skin every morning.  09/08/12  Yes Kathie Dike, MD  insulin lispro (HUMALOG) 100 UNIT/ML injection Inject 5-30  Units into the skin 2 (two) times daily with a meal. Sliding Scale per patient   Yes Historical Provider, MD  lenalidomide ([name]) 5 MG capsule Take 1 capsule (5 mg total) by mouth daily. Take 1 tablet PO 7 days on and 7 days off. 02/19/15  Yes Baird Cancer, PA-C  lipase/protease/amylase (CREON) 36000 UNITS CPEP capsule 5 capsules with each meal and 2 with each snack 01/16/15  Yes Rogene Houston, MD  lisinopril (PRINIVIL,ZESTRIL) 5 MG tablet Take 5 mg by mouth every other day. 12/14/13  Yes Historical Provider, MD  loratadine (CLARITIN) 10 MG tablet Take 10 mg by mouth every morning.    Yes Historical Provider, MD  metoprolol succinate (TOPROL-XL) 25 MG 24 hr tablet TAKE ONE TABLET BY MOUTH ONCE DAILY. 12/01/14  Yes Evans Lance, MD  Multiple Vitamins-Minerals (MULTIVITAMINS THER. W/MINERALS) TABS Take 1 tablet by mouth every morning.    Yes Historical Provider, MD  potassium chloride SA (K-DUR,KLOR-CON) 20 MEQ tablet TAKE 2 TABLETS BY MOUTH TWICE DAILY. 12/26/14  Yes Manon Hilding Kefalas, PA-C  ranitidine (ZANTAC) 150 MG tablet Take 150 mg by mouth once.   Yes Historical Provider, MD  sodium bicarbonate 650 MG  tablet Take 1,300 mg by mouth 3 (three) times daily.   Yes Historical Provider, MD  sulfamethoxazole-trimethoprim (BACTRIM DS,SEPTRA DS) 800-160 MG per tablet TAKE ONE TABLET BY MOUTH EVERY MONDAY, [place], [place]. 01/30/15  Yes Manon Hilding Kefalas, PA-C  torsemide (DEMADEX) 20 MG tablet TAKE 2 TABLETS BY MOUTH EACH MORNING AND 1 TABLET EACH EVENING. 12/22/14  Yes Butch Penny, NP  Vitamin D, Ergocalciferol, (DRISDOL) 50000 UNITS CAPS capsule TAKE 1 CAPSULE BY MOUTH WEEKLY FOR 4 WEEKS; THEN TAKE 1 CAPSULE MONTHLY THEREAFTER. 10/05/14  Yes [name] S Kefalas, PA-C  XARELTO 20 MG TABS tablet TAKE 1 TABLET BY MOUTH DAILY. 12/07/14  Yes Herminio Commons, MD  nitroGLYCERIN (NITROSTAT) 0.4 MG SL tablet Place 1 tablet (0.4 mg total) under the tongue every 5 (five) minutes as needed for chest pain. 09/17/12   Lendon Colonel, NP   BP 123/87 mmHg  Pulse 49  Temp(Src) 98.4 F (36.9 C) (Oral)  Resp 20  Ht _0  (1.727 m)  Wt 448 lb (203.211 kg)  BMI 68.13 kg/m2  SpO2 100% Physical Exam CONSTITUTIONAL: Well developed/well nourished HEAD: Normocephalic/atraumatic EYES: EOMI/PERRL ENMT: Mucous membranes moist NECK: supple no meningeal signs SPINE/BACK:entire spine nontender CV: S1/S2 noted, bradycardic LUNGS: Lungs are clear to auscultation bilaterally, no apparent distress ABDOMEN: soft, moderate epigastric and RUQ tenderness, no rebound or guarding, bowel sounds noted throughout abdomen GU:no cva tenderness NEURO: Pt is awake/alert/appropriate, moves all extremitiesx4.  No facial droop.   EXTREMITIES: pulses normal/equal, full ROM SKIN: warm, color normal PSYCH: no abnormalities of mood noted, alert and oriented to situation  ED Course  Procedures  Medications  pantoprazole (PROTONIX) injection 40 mg (40 mg Intravenous Given 02/25/15 1521)  ondansetron (ZOFRAN) injection 4 mg (4 mg Intravenous Given 02/25/15 1524)  morphine 4 MG/ML injection 4 mg (4 mg Intravenous Given 02/25/15 1656)   iohexol (OMNIPAQUE) 300 MG/ML solution 50 mL (50 mLs Oral Contrast Given 02/25/15 1736)  morphine 4 MG/ML injection 4 mg (4 mg Intravenous Given 02/25/15 2042)    Labs Review Labs Reviewed  CBC WITH DIFFERENTIAL/PLATELET - Abnormal; Notable for the following:    WBC 11.3 (*)    RBC 3.41 (*)    Hemoglobin 10.5 (*)    HCT 32.1 (*)    Platelets  125 (*)    Monocytes Absolute 2.0 (*)    All other components within normal limits  BASIC METABOLIC PANEL - Abnormal; Notable for the following:    Glucose, Bld 213 (*)    BUN 47 (*)    Creatinine, Ser 1.84 (*)    GFR calc non Af Amer 39 (*)    GFR calc Af Amer 46 (*)    All other components within normal limits  HEPATIC FUNCTION PANEL - Abnormal; Notable for the following:    Total Protein 5.8 (*)    Albumin 3.3 (*)    All other components within normal limits  LIPASE, BLOOD - Abnormal; Notable for the following:    Lipase 293 (*)    All other components within normal limits  CBG MONITORING, ED - Abnormal; Notable for the following:    Glucose-Capillary 205 (*)    All other components within normal limits  I-STAT TROPOININ, ED  I-STAT TROPOININ, ED    Imaging Review Ct Abdomen Pelvis Wo Contrast  02/25/2015   CLINICAL DATA:  Epigastric pain  EXAM: CT ABDOMEN AND PELVIS WITHOUT CONTRAST  TECHNIQUE: Multidetector CT imaging of the abdomen and pelvis was performed following the standard protocol without IV contrast. Oral contrast was administered.  COMPARISON:  12/01/2012  FINDINGS: Lung bases are free of acute infiltrate or sizable effusion. There is evidence of a left diaphragmatic hernia containing a loop of colon. This is stable from the prior exam. Coronary calcifications are noted.  Gallbladder is well distended demonstrates a single dependent gallstone. No gallbladder wall thickening or pericholecystic fluid is seen. The liver, spleen, adrenal glands and pancreas are within normal limits.  The kidneys are well visualized and demonstrates  some perinephric stranding similar to that seen on the prior exam. No renal calculi or obstructive changes are noted. Ureters are within normal limits. The bladder is well distended.  The appendix is well visualized within normal limits. No acute bony abnormality is seen. Degenerative changes of the lumbar spine are noted. No free pelvic fluid is seen.  IMPRESSION: Stable left diaphragmatic hernia. This may be related to the patient's discomfort.  Cholelithiasis without complicating factors.  Although not mentioned in the body of the report, there is a small sliding-type hiatal hernia.   Electronically Signed   By: Inez Catalina M.D.   On: 02/25/2015 20:36   Dg Chest 2 View  02/25/2015   CLINICAL DATA:  Chest pain for a couple days.  Shortness of breath.  EXAM: CHEST  2 VIEW  COMPARISON:  12/07/2012.  Chest CT 07/04/2011  FINDINGS: Stable airspace opacity at the left lung base since 2014. This was shown on prior CT to represent herniated fat through a lateral diaphoretic defect. Cardiomegaly. Right Port-A-Cath is in place with the tip in the SVC. No acute airspace opacity or effusion.  IMPRESSION: Density at the left base is stable, shown to represent lateral diaphragmatic herniation on prior CT.  Cardiomegaly.  No active disease.   Electronically Signed   By: Rolm Baptise M.D.   On: 02/25/2015 14:48   I have personally reviewed and evaluated these images and lab results as part of my medical decision-making.   EKG Interpretation   Date/Time:  Sunday February 25 2015 14:04:29 EDT Ventricular Rate:  67 PR Interval:    QRS Duration: 104 QT Interval:  492 QTC Calculation: 519 R Axis:   -15 Text Interpretation:  Atrial fibrillation slow ventricular response  Inferior infarct, old Abnormal lateral Q waves Anterior  infarct, old  Baseline wander in lead(s) II similar to prior EKG Confirmed by [facility]   MD, DONALD ([id number]) on 02/25/2015 2:11:04 PM     4:04 PM Pt with CP and abd pain/burning Pt is  stable at this time He does have bradycardic rhythm but he reports this is not new and no weakness/syncope reported Workup pending at this time 4:56 PM Pt with continued epigastric pain He has elevated lipase Will obtain Ct imaging without IV contrast (pt has renal insufficiency at baseline) 9:42 PM PT IMPROVED CT IMAGING NEGATIVE FOR ACUTE DISEASE HE FEELS IMPROVED WILL D/CH OME LOW SUSPICION FOR ACS/PE/DISSSECTION REPEAT TROPONIN NEGATIVE WILL D/C HOME HE REQUESTS DEXILANT REFILL BP 117/68 mmHg  Pulse 57  Temp(Src) 98.4 F (36.9 C) (Oral)  Resp 16  Ht _0  (1.727 m)  Wt 388 lb (175.996 kg)  BMI 59.01 kg/m2  SpO2 98%    EKG Interpretation  Date/Time:  Sunday February 25 2015 16:09:04 EDT Ventricular Rate:  49 PR Interval:  292 QRS Duration: 94 QT Interval:  476 QTC Calculation: 430 R Axis:   6 Text Interpretation:  Sinus bradycardia Ventricular premature complex Prolonged PR interval Abnormal lateral Q waves Anterior infarct, old Confirmed by Christy Gentles  MD, DONALD ([id number]) on 02/25/2015 4:14:20 PM       MDM   Final diagnoses:  Calculus of gallbladder without cholecystitis without obstruction  Chest pain, unspecified chest pain type  Hiatal hernia  Atrial fibrillation with slow ventricular response  Renal insufficiency  Hyperglycemia    Nursing notes including past medical history and social history reviewed and considered in documentation xrays/imaging reviewed by myself and considered during evaluation Labs/vital reviewed myself and considered during evaluation Previous records reviewed and considered     Ripley Fraise, MD 02/25/15 2143

## 2015-02-27 ENCOUNTER — Telehealth (INDEPENDENT_AMBULATORY_CARE_PROVIDER_SITE_OTHER): Payer: Self-pay | Admitting: *Deleted

## 2015-02-27 NOTE — Telephone Encounter (Signed)
[name] has been having bad digestive problems. Had to go to the ED on Sunday, 02/25/15. Pain down his esophagus to the abd. Please return his call [phone].

## 2015-02-27 NOTE — Telephone Encounter (Signed)
Per Dr.Rehman the patient is to take the Dexilant 60 mg daily , Purchase Pepcid 40 and take at night. Reglan 10 mg Take 1 by mouth 4 times a day with a meal for 2 weeks.  Patient states that he is badly constipated. Per Dr.Rehman patient is to use Miralax 17 grams daily. He noted that he had bright red blood after using a enema ,and digital breakup of stool. Patient was advised per Dr.Rehman just to watch for blood, as it is felt that what the patient saw was from the enema and digital break-up of the stool. The medications were called to the [facility] upon patient request.

## 2015-03-05 ENCOUNTER — Telehealth (INDEPENDENT_AMBULATORY_CARE_PROVIDER_SITE_OTHER): Payer: Self-pay | Admitting: *Deleted

## 2015-03-05 NOTE — Telephone Encounter (Signed)
Patient called another voice message left, asking that he call the office.

## 2015-03-05 NOTE — Telephone Encounter (Signed)
Patient called, he was in a conference. He ask that I call him back.

## 2015-03-05 NOTE — Telephone Encounter (Signed)
[name] would like to speak with [name] about his progress report. The return phone number is [phone].

## 2015-03-08 ENCOUNTER — Encounter (HOSPITAL_COMMUNITY): Payer: Managed Care, Other (non HMO) | Attending: Oncology

## 2015-03-08 DIAGNOSIS — C9000 Multiple myeloma not having achieved remission: Secondary | ICD-10-CM | POA: Diagnosis present

## 2015-03-08 LAB — CBC WITH DIFFERENTIAL/PLATELET
Basophils Absolute: 0 10*3/uL (ref 0.0–0.1)
Basophils Relative: 1 %
Eosinophils Absolute: 0.2 10*3/uL (ref 0.0–0.7)
Eosinophils Relative: 3 %
HCT: 33.9 % — ABNORMAL LOW (ref 39.0–52.0)
Hemoglobin: 11 g/dL — ABNORMAL LOW (ref 13.0–17.0)
Lymphocytes Relative: 34 %
Lymphs Abs: 2.3 10*3/uL (ref 0.7–4.0)
MCH: 30.8 pg (ref 26.0–34.0)
MCHC: 32.4 g/dL (ref 30.0–36.0)
MCV: 95 fL (ref 78.0–100.0)
Monocytes Absolute: 0.9 10*3/uL (ref 0.1–1.0)
Monocytes Relative: 13 %
Neutro Abs: 3.3 10*3/uL (ref 1.7–7.7)
Neutrophils Relative %: 49 %
Platelets: 155 10*3/uL (ref 150–400)
RBC: 3.57 MIL/uL — ABNORMAL LOW (ref 4.22–5.81)
RDW: 14.5 % (ref 11.5–15.5)
WBC: 6.8 10*3/uL (ref 4.0–10.5)

## 2015-03-08 LAB — COMPREHENSIVE METABOLIC PANEL
ALT: 178 U/L — ABNORMAL HIGH (ref 17–63)
AST: 178 U/L — ABNORMAL HIGH (ref 15–41)
Albumin: 3.4 g/dL — ABNORMAL LOW (ref 3.5–5.0)
Alkaline Phosphatase: 93 U/L (ref 38–126)
Anion gap: 6 (ref 5–15)
BUN: 30 mg/dL — ABNORMAL HIGH (ref 6–20)
CO2: 25 mmol/L (ref 22–32)
Calcium: 8.7 mg/dL — ABNORMAL LOW (ref 8.9–10.3)
Chloride: 109 mmol/L (ref 101–111)
Creatinine, Ser: 2.02 mg/dL — ABNORMAL HIGH (ref 0.61–1.24)
GFR calc Af Amer: 41 mL/min — ABNORMAL LOW (ref >60)
GFR calc non Af Amer: 35 mL/min — ABNORMAL LOW (ref >60)
Glucose, Bld: 111 mg/dL — ABNORMAL HIGH (ref 65–99)
Potassium: 4.1 mmol/L (ref 3.5–5.1)
Sodium: 140 mmol/L (ref 135–145)
Total Bilirubin: 0.8 mg/dL (ref 0.3–1.2)
Total Protein: 5.9 g/dL — ABNORMAL LOW (ref 6.5–8.1)

## 2015-03-08 LAB — PROTEIN / CREATININE RATIO, URINE
Creatinine, Urine: 64.91 mg/dL
Protein Creatinine Ratio: 0.29 mg/mg{Cre} — ABNORMAL HIGH (ref 0.00–0.15)
Total Protein, Urine: 19 mg/dL

## 2015-03-09 ENCOUNTER — Other Ambulatory Visit: Payer: Self-pay | Admitting: Cardiovascular Disease

## 2015-03-09 ENCOUNTER — Other Ambulatory Visit (HOSPITAL_COMMUNITY): Payer: Self-pay

## 2015-03-09 ENCOUNTER — Other Ambulatory Visit: Payer: Self-pay | Admitting: Internal Medicine

## 2015-03-09 DIAGNOSIS — C9000 Multiple myeloma not having achieved remission: Secondary | ICD-10-CM

## 2015-03-09 DIAGNOSIS — R7989 Other specified abnormal findings of blood chemistry: Secondary | ICD-10-CM

## 2015-03-09 DIAGNOSIS — R945 Abnormal results of liver function studies: Principal | ICD-10-CM

## 2015-03-09 LAB — IMMUNOFIXATION ELECTROPHORESIS
IgA: 82 mg/dL — ABNORMAL LOW (ref 90–386)
IgG (Immunoglobin G), Serum: 407 mg/dL — ABNORMAL LOW (ref 700–1600)
IgM, Serum: 57 mg/dL (ref 20–172)
Total Protein ELP: 5.3 g/dL — ABNORMAL LOW (ref 6.0–8.5)

## 2015-03-09 LAB — KAPPA/LAMBDA LIGHT CHAINS
Kappa free light chain: 35.63 mg/L — ABNORMAL HIGH (ref 3.30–19.40)
Kappa, lambda light chain ratio: 1.37 (ref 0.26–1.65)
Lambda free light chains: 26.02 mg/L (ref 5.71–26.30)

## 2015-03-09 LAB — PTH, INTACT AND CALCIUM
Calcium, Total (PTH): 8.8 mg/dL (ref 8.7–10.2)
PTH: 24 pg/mL (ref 15–65)

## 2015-03-09 LAB — PROTEIN ELECTROPHORESIS, SERUM
A/G Ratio: 1.3 (ref 0.7–1.7)
Albumin ELP: 3 g/dL (ref 2.9–4.4)
Alpha-1-Globulin: 0.2 g/dL (ref 0.0–0.4)
Alpha-2-Globulin: 0.7 g/dL (ref 0.4–1.0)
Beta Globulin: 0.9 g/dL (ref 0.7–1.3)
Gamma Globulin: 0.5 g/dL (ref 0.4–1.8)
Globulin, Total: 2.4 g/dL (ref 2.2–3.9)
Total Protein ELP: 5.4 g/dL — ABNORMAL LOW (ref 6.0–8.5)

## 2015-03-09 LAB — IGG, IGA, IGM
IgA: 78 mg/dL — ABNORMAL LOW (ref 90–386)
IgG (Immunoglobin G), Serum: 406 mg/dL — ABNORMAL LOW (ref 700–1600)
IgM, Serum: 60 mg/dL (ref 20–172)

## 2015-03-09 LAB — BETA 2 MICROGLOBULIN, SERUM: Beta-2 Microglobulin: 4.1 mg/L — ABNORMAL HIGH (ref 0.6–2.4)

## 2015-03-09 LAB — VITAMIN D 25 HYDROXY (VIT D DEFICIENCY, FRACTURES): Vit D, 25-Hydroxy: 34 ng/mL (ref 30.0–100.0)

## 2015-03-12 NOTE — Telephone Encounter (Signed)
Returning Tammy's call. The return phone number is [phone] .

## 2015-03-13 NOTE — Telephone Encounter (Signed)
Talked with the patient and he states that his digestive system is up and down. Bouts of Constipation , takes the Pepcid for acid. Day four of taking the the Peepcid he has loose uncontrolled Bowel Movements that last. Then he says that he took a laxative the next day , and it stayed loose. It was 3-4 days that he had uncontrolled BM's oh his self. The last couple of days he has had 2-3, this is when he eats very little food. He is trying to get control back. Wearing depends. Liver values are up, to repeat next week.  Patient ask if the Creon may be causing the numbers to be high? Taking 6  With meals.

## 2015-03-14 NOTE — Telephone Encounter (Signed)
Patient was called and a message was left with the information below.

## 2015-03-14 NOTE — Telephone Encounter (Signed)
Per Dr.Rehman - The Creon is not causing the elevation with the Liver Enzymes. The patient may decrease the creon to 4 with a meal. The Constipation is a good sign as it indicates that the medication is working. If there is bad constipation patient may use a suppository or enema , not a laxative. The patient was called and made aware.

## 2015-03-16 ENCOUNTER — Other Ambulatory Visit (HOSPITAL_COMMUNITY): Payer: Self-pay | Admitting: Oncology

## 2015-03-16 ENCOUNTER — Encounter (HOSPITAL_BASED_OUTPATIENT_CLINIC_OR_DEPARTMENT_OTHER): Payer: Managed Care, Other (non HMO) | Admitting: Hematology & Oncology

## 2015-03-16 ENCOUNTER — Encounter (HOSPITAL_COMMUNITY): Payer: Self-pay | Admitting: Hematology & Oncology

## 2015-03-16 ENCOUNTER — Encounter (HOSPITAL_BASED_OUTPATIENT_CLINIC_OR_DEPARTMENT_OTHER): Payer: Managed Care, Other (non HMO)

## 2015-03-16 VITALS — BP 95/51 | HR 57 | Temp 98.5°F | Resp 18 | Wt 348.0 lb

## 2015-03-16 DIAGNOSIS — L97529 Non-pressure chronic ulcer of other part of left foot with unspecified severity: Secondary | ICD-10-CM

## 2015-03-16 DIAGNOSIS — R945 Abnormal results of liver function studies: Principal | ICD-10-CM

## 2015-03-16 DIAGNOSIS — C9000 Multiple myeloma not having achieved remission: Secondary | ICD-10-CM | POA: Diagnosis not present

## 2015-03-16 DIAGNOSIS — N183 Chronic kidney disease, stage 3 (moderate): Secondary | ICD-10-CM | POA: Diagnosis not present

## 2015-03-16 DIAGNOSIS — Z0000 Encounter for general adult medical examination without abnormal findings: Secondary | ICD-10-CM

## 2015-03-16 DIAGNOSIS — R7989 Other specified abnormal findings of blood chemistry: Secondary | ICD-10-CM | POA: Diagnosis not present

## 2015-03-16 DIAGNOSIS — R748 Abnormal levels of other serum enzymes: Secondary | ICD-10-CM

## 2015-03-16 DIAGNOSIS — C9001 Multiple myeloma in remission: Secondary | ICD-10-CM

## 2015-03-16 DIAGNOSIS — E119 Type 2 diabetes mellitus without complications: Secondary | ICD-10-CM

## 2015-03-16 DIAGNOSIS — Z23 Encounter for immunization: Secondary | ICD-10-CM | POA: Diagnosis not present

## 2015-03-16 LAB — COMPREHENSIVE METABOLIC PANEL
ALT: 169 U/L — ABNORMAL HIGH (ref 17–63)
AST: 128 U/L — ABNORMAL HIGH (ref 15–41)
Albumin: 3.4 g/dL — ABNORMAL LOW (ref 3.5–5.0)
Alkaline Phosphatase: 90 U/L (ref 38–126)
Anion gap: 5 (ref 5–15)
BUN: 35 mg/dL — ABNORMAL HIGH (ref 6–20)
CO2: 18 mmol/L — ABNORMAL LOW (ref 22–32)
Calcium: 8.4 mg/dL — ABNORMAL LOW (ref 8.9–10.3)
Chloride: 116 mmol/L — ABNORMAL HIGH (ref 101–111)
Creatinine, Ser: 2.1 mg/dL — ABNORMAL HIGH (ref 0.61–1.24)
GFR calc Af Amer: 39 mL/min — ABNORMAL LOW (ref >60)
GFR calc non Af Amer: 34 mL/min — ABNORMAL LOW (ref >60)
Glucose, Bld: 154 mg/dL — ABNORMAL HIGH (ref 65–99)
Potassium: 4.2 mmol/L (ref 3.5–5.1)
Sodium: 139 mmol/L (ref 135–145)
Total Bilirubin: 0.7 mg/dL (ref 0.3–1.2)
Total Protein: 5.9 g/dL — ABNORMAL LOW (ref 6.5–8.1)

## 2015-03-16 MED ORDER — LENALIDOMIDE 5 MG PO CAPS: 5.0000 mg | ORAL_CAPSULE | Freq: Every day | ORAL | Status: DC

## 2015-03-16 MED ORDER — HEPARIN SOD (PORK) LOCK FLUSH 100 UNIT/ML IV SOLN
500.0000 [IU] | Freq: Once | INTRAVENOUS | Status: AC
  Administered 2015-03-16: 500 [IU] via INTRAVENOUS

## 2015-03-16 MED ORDER — HEPARIN SOD (PORK) LOCK FLUSH 100 UNIT/ML IV SOLN
INTRAVENOUS | Status: AC
  Filled 2015-03-16: qty 5

## 2015-03-16 MED ORDER — INFLUENZA VAC SPLIT QUAD 0.5 ML IM SUSY
0.5000 mL | PREFILLED_SYRINGE | Freq: Once | INTRAMUSCULAR | Status: AC
  Administered 2015-03-16: 0.5 mL via INTRAMUSCULAR
  Filled 2015-03-16: qty 0.5

## 2015-03-16 MED ORDER — NITROGLYCERIN 0.4 MG SL SUBL: 0.4000 mg | SUBLINGUAL_TABLET | SUBLINGUAL | Status: DC | PRN

## 2015-03-16 MED ORDER — SODIUM CHLORIDE 0.9 % IJ SOLN
10.0000 mL | INTRAMUSCULAR | Status: DC | PRN
  Administered 2015-03-16: 10 mL via INTRAVENOUS
  Filled 2015-03-16: qty 10

## 2015-03-16 NOTE — Progress Notes (Signed)
LABS DRAWN

## 2015-03-16 NOTE — Patient Instructions (Signed)
..  [place] at [facility] Discharge Instructions  RECOMMENDATIONS MADE BY THE CONSULTANT AND ANY TEST RESULTS WILL BE SENT TO YOUR REFERRING PHYSICIAN.  Exam per Dr. Whitney Muse today We will do your labs every 6 weeks Return to see Dr. Whitney Muse in 2 months  Thank you for choosing [place] at [facility] to provide your oncology and hematology care.  To afford each patient quality time with our provider, please arrive at least 15 minutes before your scheduled appointment time.    You need to re-schedule your appointment should you arrive 10 or more minutes late.  We strive to give you quality time with our providers, and arriving late affects you and other patients whose appointments are after yours.  Also, if you no show three or more times for appointments you may be dismissed from the clinic at the providers discretion.     Again, thank you for choosing [facility].  Our hope is that these requests will decrease the amount of time that you wait before being seen by our physicians.       _____________________________________________________________  Should you have questions after your visit to [facility], please contact our office at [phone] between the hours of 8:30 a.m. and 4:30 p.m.  Voicemails left after 4:30 p.m. will not be returned until the following business day.  For prescription refill requests, have your pharmacy contact our office.

## 2015-03-16 NOTE — Progress Notes (Signed)
..  TESHON CHASSE presents today for injection per the provider's orders.  Flu vaccine administration without incident; see MAR for injection details.  Patient tolerated procedure well and without incident.  No questions or complaints noted at this time. Marland KitchenMargrett Rud Laidler presented for Portacath access and flush.  Proper placement of portacath confirmed by CXR.  Portacath located rt chest wall accessed with  H 20 needle.  Good blood return present. Portacath flushed with 40ml NS and 500U/2ml Heparin and needle removed intact.  Procedure tolerated well and without incident.

## 2015-03-16 NOTE — Progress Notes (Signed)
Franklin Font, MD [address] [place] [address]    DIAGNOSIS:  Myeloma, kappa light chain, Stage II disease (no evidence of monoclonal protein in serum Evaluation at [facility] for transplant, felt not to be a good transplant candidate Stage III CKD 3 g proteinuria at diagnosis, Bence-Jones proteins  BMBX at diagnosis with 19% plasma cells, kappa restricted  BMBX 09/28/2014 showing variably cellular marrow with trilineage hematopoiesis and 2% plasma cells. IHC confirms the presence of a minor plasma cell component which appears to show polyclonal staining pattern per And lambda light chain. Latter appearance is nonspecific and not diagnostic of a plasma cell neoplasm. Background shows trilineage hematopoiesis with nonspecific changes.  CT maxillofacial without contrast 12/05/2014 with mild by asymmetric sclerosis in the R mandibular ramus and adjacent body compared tot the left, without sequestrum or fracture. Could be a manifestation of very early or mild AVN.  CURRENT THERAPY: Lenalidomide 5 mg 7 days on and 7 days off with 20 mg Dexamethasone BID every Friday only.  Last zometa 09/07/2014  INTERVAL HISTORY: [name] 54 y.o. male returns for follow-up of myeloma.  [name] is here alone today.  His liver enzymes are very high. His myeloma, on the other hand, looks good.  He recently went to the ER due to a stomach acid issue. He said they gave him "something in his veins" to stop the acid. There's a new pill (Reglan) that he's been taking four times a day for two weeks, but he remarks that he's on the last few days of that prescription.  [name] is also asking about prostate PSA bloodwork. He would like that to be done next time he comes.  His appointment regarding his jaw pain is still pending.He notes he does have an upcoming appointment with a dentist.  He states that his "foot is coming slowly," in terms of the wound healing. He is still wearing a boot  on his left foot.  He has an upcoming appointment with Dr. Laural Waller. He denies any new medication or OTC supplements. He denies abdominal pain, nausea or vomiting. Pre-visit labs done on 03/08/15 showed an AST and ALT of 178 U/L. Prior labs were normal. They are slightly improved today.  MEDICAL HISTORY: Past Medical History  Diagnosis Date  . Arteriosclerotic cardiovascular disease (ASCVD)     MI-2000s; stent to the proximal LAD and diagonal in 2001; stress nuclear in 2008-impaired exercise capacity, left ventricular dilatation, moderately to severely depressed EF, apical, inferior and anteroseptal scar  . Hypertension   . 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 Diabetes mellitus     Insulin  . GERD (gastroesophageal reflux disease)   . Pedal edema     Venous insufficiency  . Obesity   . Hyperlipidemia   . Injection site reaction   . Cellulitis of leg     both legs  . Chronic kidney disease, stage 3, mod decreased GFR     Creatinine of 1.84 in 06/2011 and 1.5 in 07/2011  . Chronic diarrhea   . Sleep apnea     uses cpap  . Ulcer   . Atrial flutter ([place])   . Multiple myeloma 07/01/2011  . Myocardial infarction ([place]) 2000  . Gout     has DIABETES MELLITUS, TYPE II, ON INSULIN; Morbid obesity ([place]); SLEEP APNEA; Multiple myeloma ([place]); Arteriosclerotic cardiovascular disease (ASCVD); Hypertension; Hyperlipidemia; DDD (degenerative disc disease), cervical; Atrial flutter ([place]); Chronic pancreatitis ([place]); Anemia, normocytic normochromic; and Chronic systolic  heart failure ([place]) on his problem list.     has No Known Allergies.  [name] does not currently have medications on file.  SURGICAL HISTORY: Past Surgical History  Procedure Laterality Date  . Laparoscopic gastric banding  2006    has been removed  . Wrist surgery      Left; removal of bone fragment  . Incision and drainage abscess anal    . Abscess drainage      Scrotal  . Bone marrow biopsy  05/13/11  . Portacath  placement  07/07/2011    Procedure: INSERTION PORT-A-CATH;  Surgeon: Scherry Ran, MD;  Location: [facility];  Service: General;  Laterality: N/A;  . Colonoscopy  11/28/2011    Procedure: COLONOSCOPY;  Surgeon: Rogene Houston, MD;  Location: [facility];  Service: Endoscopy;  Laterality: N/A;  930  . Esophagogastroduodenoscopy  01/02/2012    Procedure: ESOPHAGOGASTRODUODENOSCOPY (EGD);  Surgeon: Rogene Houston, MD;  Location: [facility];  Service: Endoscopy;  Laterality: N/A;  100  . Esophageal biopsy  01/02/2012    Procedure: BIOPSY;  Surgeon: Rogene Houston, MD;  [facility];  Service: Endoscopy;  Laterality: N/A;  . Esophagogastroduodenoscopy N/A 09/20/2012    Procedure: ESOPHAGOGASTRODUODENOSCOPY (EGD);  Surgeon: Rogene Houston, MD;  [facility];  Service: Endoscopy;  Laterality: N/A;  . Eus N/A 10/07/2012    Procedure: UPPER ENDOSCOPIC ULTRASOUND (EUS) LINEAR;  Surgeon: Milus Banister, MD;  Location: [facility];  Service: Endoscopy;  Laterality: N/A;  . Cardioversion N/A 10/13/2012    Procedure: CARDIOVERSION;  Surgeon: Yehuda Savannah, MD;  Location: [facility];  Service: Cardiovascular;  Laterality: N/A;  . Cardiac catheterization      cardiac stent  . Port-a-cath removal Left 12/07/2012    Procedure: REMOVAL PORT-A-CATH;  Surgeon: Scherry Ran, MD;  Location: [facility];  Service: General;  Laterality: Left;  . Portacath placement N/A 12/07/2012    Procedure: INSERTION PORT-A-CATH;  Surgeon: Scherry Ran, MD;  Location: [facility];  Service: General;  Laterality: N/A;  Attempted portacath placement on left and right side  . Cataract extraction w/phaco Left 02/13/2014    Procedure: CATARACT EXTRACTION PHACO AND INTRAOCULAR LENS PLACEMENT (IOC);  Surgeon: Tonny Branch, MD;  Location: [facility];  Service: Ophthalmology;  Laterality: Left;  CDE:  7.67  . Cataract extraction w/phaco Right 03/02/2014    Procedure: CATARACT EXTRACTION PHACO AND INTRAOCULAR LENS PLACEMENT  RIGHT EYE CDE=16.81;  Surgeon: Tonny Branch, MD;  Location: [facility];  Service: Ophthalmology;  Laterality: Right;    SOCIAL HISTORY: Social History   Social History  . Marital Status: Married    Spouse Name: N/A  . Number of Children: N/A  . Years of Education: 16   Occupational History  . Nurse, learning disability   . Funeral Director    Social History Main Topics  . Smoking status: Former Smoker -- 0.25 packs/day for 1 years    Types: Cigarettes, Cigars    Quit date: 05/17/2001  . Smokeless tobacco: Never Used  . Alcohol Use: No  . Drug Use: No  . Sexual Activity: Yes    Birth Control/ Protection: None   Other Topics Concern  . Not on file   Social History Narrative    FAMILY HISTORY: Family History  Problem Relation Age of Onset  . Heart disease Mother   . Cancer Mother   . Diabetes Father   . Anesthesia problems Neg Hx   . Hypotension Neg  Hx   . Malignant hyperthermia Neg Hx   . Pseudochol deficiency Neg Hx   . Arthritis      Review of Systems  Constitutional: Negative.   HENT: Negative.   Eyes: Negative.   Respiratory: Negative. Cardiovascular: Negative.   Gastrointestinal: Negative.  He says he has pains on the side of his gallbladder, but not when his abdomen is palpated.  Genitourinary: Negative.   Musculoskeletal: Negative. Skin: Negative.   Neurological: Negative.  Psychiatric/Behavioral: Negative.    14 point review of systems was performed and is negative except as detailed under history of present illness and above  PHYSICAL EXAMINATION   ECOG PERFORMANCE STATUS: 1 - Symptomatic but completely ambulatory  Filed Vitals:   03/16/15 1331  BP: 95/51  Pulse: 57  Temp: 98.5 F (36.9 C)  Resp: 18    Physical Exam  Constitutional: He is oriented to person, place, and time and well-developed, well-nourished, and in no distress. Obese HENT:  Head: Normocephalic and atraumatic.  Nose: Nose normal.  Mouth/Throat: Oropharynx is clear and moist. No  oropharyngeal exudate.  Eyes: Conjunctivae and EOM are normal. Pupils are equal, round, and reactive to light. Right eye exhibits no discharge. Left eye exhibits no discharge. No scleral icterus.  Small conjunctival hemorrage Neck: Normal range of motion. Neck supple. No tracheal deviation present. No thyromegaly present.  Cardiovascular: Normal rate, regular rhythm and normal heart sounds.  Exam reveals no gallop and no friction rub.   No murmur heard. Pulmonary/Chest: Effort normal and breath sounds normal. He has no wheezes. He has no rales.  Abdominal: Soft. Bowel sounds are normal. He exhibits no distension and no mass. There is no tenderness. There is no rebound and no guarding.  Musculoskeletal: Normal range of motion. He exhibit edema. L foot in a foot brace Lymphadenopathy:    He has no cervical adenopathy.  Neurological: He is alert and oriented to person, place, and time. He has normal reflexes. No cranial nerve deficit. Gait normal. Coordination normal.  Skin: Skin is warm and dry. No rash noted.  Psychiatric: Mood, memory, affect and judgment normal.  Nursing note and vitals reviewed.   LABORATORY DATA: I have reviewed the data as listed   CBC    Component Value Date/Time   WBC 6.8 03/08/2015 1410   RBC 3.57* 03/08/2015 1410   HGB 11.0* 03/08/2015 1410   HCT 33.9* 03/08/2015 1410   PLT 155 03/08/2015 1410   MCV 95.0 03/08/2015 1410   MCH 30.8 03/08/2015 1410   MCHC 32.4 03/08/2015 1410   RDW 14.5 03/08/2015 1410   LYMPHSABS 2.3 03/08/2015 1410   MONOABS 0.9 03/08/2015 1410   EOSABS 0.2 03/08/2015 1410   BASOSABS 0.0 03/08/2015 1410   CMP     Component Value Date/Time   NA 139 03/16/2015 1238   K 4.2 03/16/2015 1238   CL 116* 03/16/2015 1238   CO2 18* 03/16/2015 1238   GLUCOSE 154* 03/16/2015 1238   BUN 35* 03/16/2015 1238   CREATININE 2.10* 03/16/2015 1238   CREATININE 2.24* 10/05/2012 1411   CALCIUM 8.4* 03/16/2015 1238   CALCIUM 8.8 03/08/2015 1410    PROT 5.9* 03/16/2015 1238   ALBUMIN 3.4* 03/16/2015 1238   AST 128* 03/16/2015 1238   ALT 169* 03/16/2015 1238   ALKPHOS 90 03/16/2015 1238   BILITOT 0.7 03/16/2015 1238   GFRNONAA 34* 03/16/2015 1238   GFRAA 39* 03/16/2015 1238   ASSESSMENT and THERAPY PLAN:   Multiple myeloma, light chain disease Evaluation at  [facility] for transplant, felt not to be a good transplant candidate Stage III CKD Diabetes Non healing L foot ulcer  LFT's are mildly improved. He would like to continue to monitor. We will repeat in 2 weeks. He has an appointment with Dr Laural Waller in December. Unless his LFT's do not improve I see no need to move up his appointment.   Discontinued his Zometa because of the concern of osteonecrosis of the jaw. He states he has an upcoming appointment with a dentist.  His last dose of Zometa was in April.  I have offered him discontinuation of [name] since his counts were remaining so suppressed and his M spike is not detectable and bone marrow biopsy showed less than 2% plasma cells. He has opted to continue on therapy. I do not feel that he needs to continue on dexamethasone but again he feels uncomfortable discontinuing therapy because of his mother's death from myeloma.  I have recommended follow-up in 8 weeks with repeat laboratory studies. He feels comfortable with that. He has any interim problems prior to follow-up of advised him to let us know.   The only thing he needs refills on are his heart pills, nitroglycerin.  We will see him back in 2 months.  Orders Placed This Encounter  Procedures  . Comprehensive metabolic panel  . CBC with Differential    Standing Status: Standing     Number of Occurrences: 24     Standing Expiration Date: 03/15/2017  . Comprehensive metabolic panel    Standing Status: Standing     Number of Occurrences: 24     Standing Expiration Date: 03/15/2017  . IgG, IgA, IgM    Standing Status: Standing     Number of Occurrences: 24     Standing  Expiration Date: 03/15/2017  . Kappa/lambda light chains    Standing Status: Standing     Number of Occurrences: 24     Standing Expiration Date: 03/15/2017  . Immunofixation electrophoresis    Standing Status: Standing     Number of Occurrences: 24     Standing Expiration Date: 03/15/2017  . Protein electrophoresis, serum    Standing Status: Standing     Number of Occurrences: 24     Standing Expiration Date: 03/15/2017  . PSA    Standing Status: Future     Number of Occurrences:      Standing Expiration Date: 03/15/2016  . Comprehensive metabolic panel    Standing Status: Future     Number of Occurrences:      Standing Expiration Date: 03/15/2016   All questions were answered. The patient knows to call the clinic with any problems, questions or concerns. We can certainly see the patient much sooner if necessary.   This note was electronically signed.  This document serves as a record of services personally performed by Ancil Linsey, MD. It was created on her behalf by Toni Amend, a trained medical scribe. The creation of this record is based on the scribe's personal observations and the provider's statements to them. This document has been checked and approved by the attending provider.  I have reviewed the above documentation for accuracy and completeness and I agree with the above.  Patrici Ranks, MD

## 2015-03-21 ENCOUNTER — Encounter (HOSPITAL_COMMUNITY): Payer: Self-pay | Admitting: Hematology & Oncology

## 2015-03-30 ENCOUNTER — Encounter (HOSPITAL_BASED_OUTPATIENT_CLINIC_OR_DEPARTMENT_OTHER): Payer: Managed Care, Other (non HMO)

## 2015-03-30 DIAGNOSIS — C9000 Multiple myeloma not having achieved remission: Secondary | ICD-10-CM | POA: Diagnosis not present

## 2015-03-30 DIAGNOSIS — R945 Abnormal results of liver function studies: Secondary | ICD-10-CM

## 2015-03-30 DIAGNOSIS — R7989 Other specified abnormal findings of blood chemistry: Secondary | ICD-10-CM

## 2015-03-30 LAB — COMPREHENSIVE METABOLIC PANEL
ALT: 187 U/L — ABNORMAL HIGH (ref 17–63)
AST: 183 U/L — ABNORMAL HIGH (ref 15–41)
Albumin: 3.3 g/dL — ABNORMAL LOW (ref 3.5–5.0)
Alkaline Phosphatase: 130 U/L — ABNORMAL HIGH (ref 38–126)
Anion gap: 7 (ref 5–15)
BUN: 33 mg/dL — ABNORMAL HIGH (ref 6–20)
CO2: 18 mmol/L — ABNORMAL LOW (ref 22–32)
Calcium: 8.3 mg/dL — ABNORMAL LOW (ref 8.9–10.3)
Chloride: 111 mmol/L (ref 101–111)
Creatinine, Ser: 1.75 mg/dL — ABNORMAL HIGH (ref 0.61–1.24)
GFR calc Af Amer: 48 mL/min — ABNORMAL LOW (ref >60)
GFR calc non Af Amer: 42 mL/min — ABNORMAL LOW (ref >60)
Glucose, Bld: 305 mg/dL — ABNORMAL HIGH (ref 65–99)
Potassium: 4.3 mmol/L (ref 3.5–5.1)
Sodium: 136 mmol/L (ref 135–145)
Total Bilirubin: 0.6 mg/dL (ref 0.3–1.2)
Total Protein: 5.8 g/dL — ABNORMAL LOW (ref 6.5–8.1)

## 2015-04-02 NOTE — Progress Notes (Signed)
Labs drawn

## 2015-04-06 ENCOUNTER — Other Ambulatory Visit (HOSPITAL_COMMUNITY): Payer: Self-pay | Admitting: Oncology

## 2015-04-13 ENCOUNTER — Other Ambulatory Visit: Payer: Self-pay | Admitting: Cardiovascular Disease

## 2015-04-16 ENCOUNTER — Other Ambulatory Visit (HOSPITAL_COMMUNITY): Payer: Self-pay | Admitting: Oncology

## 2015-04-16 DIAGNOSIS — C9001 Multiple myeloma in remission: Secondary | ICD-10-CM

## 2015-04-16 MED ORDER — LENALIDOMIDE 5 MG PO CAPS: 5.0000 mg | ORAL_CAPSULE | Freq: Every day | ORAL | Status: DC

## 2015-04-19 ENCOUNTER — Encounter (HOSPITAL_COMMUNITY): Payer: Managed Care, Other (non HMO) | Attending: Oncology

## 2015-04-19 DIAGNOSIS — C9001 Multiple myeloma in remission: Secondary | ICD-10-CM

## 2015-04-19 DIAGNOSIS — C9000 Multiple myeloma not having achieved remission: Secondary | ICD-10-CM

## 2015-04-19 DIAGNOSIS — Z452 Encounter for adjustment and management of vascular access device: Secondary | ICD-10-CM

## 2015-04-19 DIAGNOSIS — Z0000 Encounter for general adult medical examination without abnormal findings: Secondary | ICD-10-CM

## 2015-04-19 LAB — COMPREHENSIVE METABOLIC PANEL
ALT: 70 U/L — ABNORMAL HIGH (ref 17–63)
AST: 50 U/L — ABNORMAL HIGH (ref 15–41)
Albumin: 3.1 g/dL — ABNORMAL LOW (ref 3.5–5.0)
Alkaline Phosphatase: 74 U/L (ref 38–126)
Anion gap: 7 (ref 5–15)
BUN: 27 mg/dL — ABNORMAL HIGH (ref 6–20)
CO2: 22 mmol/L (ref 22–32)
Calcium: 8.6 mg/dL — ABNORMAL LOW (ref 8.9–10.3)
Chloride: 113 mmol/L — ABNORMAL HIGH (ref 101–111)
Creatinine, Ser: 1.69 mg/dL — ABNORMAL HIGH (ref 0.61–1.24)
GFR calc Af Amer: 51 mL/min — ABNORMAL LOW (ref >60)
GFR calc non Af Amer: 44 mL/min — ABNORMAL LOW (ref >60)
Glucose, Bld: 144 mg/dL — ABNORMAL HIGH (ref 65–99)
Potassium: 3.9 mmol/L (ref 3.5–5.1)
Sodium: 142 mmol/L (ref 135–145)
Total Bilirubin: 0.6 mg/dL (ref 0.3–1.2)
Total Protein: 5.4 g/dL — ABNORMAL LOW (ref 6.5–8.1)

## 2015-04-19 LAB — CBC WITH DIFFERENTIAL/PLATELET
Basophils Absolute: 0 10*3/uL (ref 0.0–0.1)
Basophils Relative: 0 %
Eosinophils Absolute: 0.2 10*3/uL (ref 0.0–0.7)
Eosinophils Relative: 2 %
HCT: 30.3 % — ABNORMAL LOW (ref 39.0–52.0)
Hemoglobin: 9.6 g/dL — ABNORMAL LOW (ref 13.0–17.0)
Lymphocytes Relative: 27 %
Lymphs Abs: 1.9 10*3/uL (ref 0.7–4.0)
MCH: 31.6 pg (ref 26.0–34.0)
MCHC: 31.7 g/dL (ref 30.0–36.0)
MCV: 99.7 fL (ref 78.0–100.0)
Monocytes Absolute: 1 10*3/uL (ref 0.1–1.0)
Monocytes Relative: 14 %
Neutro Abs: 4 10*3/uL (ref 1.7–7.7)
Neutrophils Relative %: 57 %
Platelets: 142 10*3/uL — ABNORMAL LOW (ref 150–400)
RBC: 3.04 MIL/uL — ABNORMAL LOW (ref 4.22–5.81)
RDW: 15.2 % (ref 11.5–15.5)
WBC: 7.1 10*3/uL (ref 4.0–10.5)

## 2015-04-19 LAB — PSA: PSA: 0.2 ng/mL (ref 0.00–4.00)

## 2015-04-19 MED ORDER — SODIUM CHLORIDE 0.9 % IJ SOLN
10.0000 mL | INTRAMUSCULAR | Status: DC | PRN
  Administered 2015-04-19: 10 mL via INTRAVENOUS
  Filled 2015-04-19: qty 10

## 2015-04-19 MED ORDER — HEPARIN SOD (PORK) LOCK FLUSH 100 UNIT/ML IV SOLN
INTRAVENOUS | Status: AC
  Filled 2015-04-19: qty 5

## 2015-04-19 MED ORDER — HEPARIN SOD (PORK) LOCK FLUSH 100 UNIT/ML IV SOLN
500.0000 [IU] | Freq: Once | INTRAVENOUS | Status: AC
  Administered 2015-04-19: 500 [IU] via INTRAVENOUS

## 2015-04-19 NOTE — Progress Notes (Signed)
[name] presented for Portacath access and flush. Proper placement of portacath confirmed by CXR. Portacath located right chest wall accessed with  H 20 needle. Good blood return present.  Specimen drawn for labs. Portacath flushed with 52ml NS and 500U/34ml Heparin and needle removed intact. Procedure without incident. Patient tolerated procedure well.

## 2015-04-19 NOTE — Patient Instructions (Signed)
[place] at [facility] Discharge Instructions  RECOMMENDATIONS MADE BY THE CONSULTANT AND ANY TEST RESULTS WILL BE SENT TO YOUR REFERRING PHYSICIAN.  Port flush today, return in eight weeks for port flush and in December as scheduled to see the physician.    Thank you for choosing [place] at [facility] to provide your oncology and hematology care.  To afford each patient quality time with our provider, please arrive at least 15 minutes before your scheduled appointment time.    You need to re-schedule your appointment should you arrive 10 or more minutes late.  We strive to give you quality time with our providers, and arriving late affects you and other patients whose appointments are after yours.  Also, if you no show three or more times for appointments you may be dismissed from the clinic at the providers discretion.     Again, thank you for choosing [facility].  Our hope is that these requests will decrease the amount of time that you wait before being seen by our physicians.       _____________________________________________________________  Should you have questions after your visit to [facility], please contact our office at [phone] between the hours of 8:30 a.m. and 4:30 p.m.  Voicemails left after 4:30 p.m. will not be returned until the following business day.  For prescription refill requests, have your pharmacy contact our office.

## 2015-04-20 ENCOUNTER — Other Ambulatory Visit (INDEPENDENT_AMBULATORY_CARE_PROVIDER_SITE_OTHER): Payer: Self-pay | Admitting: Internal Medicine

## 2015-04-20 LAB — PROTEIN ELECTROPHORESIS, SERUM
A/G Ratio: 1.6 (ref 0.7–1.7)
Albumin ELP: 2.9 g/dL (ref 2.9–4.4)
Alpha-1-Globulin: 0.2 g/dL (ref 0.0–0.4)
Alpha-2-Globulin: 0.6 g/dL (ref 0.4–1.0)
Beta Globulin: 0.8 g/dL (ref 0.7–1.3)
Gamma Globulin: 0.3 g/dL — ABNORMAL LOW (ref 0.4–1.8)
Globulin, Total: 1.8 g/dL — ABNORMAL LOW (ref 2.2–3.9)
Total Protein ELP: 4.7 g/dL — ABNORMAL LOW (ref 6.0–8.5)

## 2015-04-20 LAB — KAPPA/LAMBDA LIGHT CHAINS
Kappa free light chain: 26.15 mg/L — ABNORMAL HIGH (ref 3.30–19.40)
Kappa, lambda light chain ratio: 1.33 (ref 0.26–1.65)
Lambda free light chains: 19.62 mg/L (ref 5.71–26.30)

## 2015-04-20 LAB — IGG, IGA, IGM
IgA: 61 mg/dL — ABNORMAL LOW (ref 90–386)
IgG (Immunoglobin G), Serum: 325 mg/dL — ABNORMAL LOW (ref 700–1600)
IgM, Serum: 40 mg/dL (ref 20–172)

## 2015-04-23 ENCOUNTER — Ambulatory Visit (INDEPENDENT_AMBULATORY_CARE_PROVIDER_SITE_OTHER): Payer: Managed Care, Other (non HMO) | Admitting: Internal Medicine

## 2015-04-23 ENCOUNTER — Encounter (INDEPENDENT_AMBULATORY_CARE_PROVIDER_SITE_OTHER): Payer: Self-pay | Admitting: Internal Medicine

## 2015-04-23 VITALS — BP 108/76 | HR 74 | Temp 98.6°F | Resp 18 | Ht 72.0 in | Wt 356.6 lb

## 2015-04-23 DIAGNOSIS — K903 Pancreatic steatorrhea: Secondary | ICD-10-CM

## 2015-04-23 DIAGNOSIS — R7989 Other specified abnormal findings of blood chemistry: Secondary | ICD-10-CM

## 2015-04-23 DIAGNOSIS — K589 Irritable bowel syndrome without diarrhea: Secondary | ICD-10-CM | POA: Diagnosis not present

## 2015-04-23 DIAGNOSIS — R945 Abnormal results of liver function studies: Principal | ICD-10-CM

## 2015-04-23 DIAGNOSIS — K8020 Calculus of gallbladder without cholecystitis without obstruction: Secondary | ICD-10-CM | POA: Diagnosis not present

## 2015-04-23 LAB — IMMUNOFIXATION ELECTROPHORESIS
IgA: 62 mg/dL — ABNORMAL LOW (ref 90–386)
IgG (Immunoglobin G), Serum: 323 mg/dL — ABNORMAL LOW (ref 700–1600)
IgM, Serum: 39 mg/dL (ref 20–172)
Total Protein ELP: 4.7 g/dL — ABNORMAL LOW (ref 6.0–8.5)

## 2015-04-23 MED ORDER — DICYCLOMINE HCL 20 MG PO TABS: 20.0000 mg | ORAL_TABLET | Freq: Four times a day (QID) | ORAL | Status: DC

## 2015-04-23 MED ORDER — DIPHENOXYLATE-ATROPINE 2.5-0.025 MG PO TABS: 2.0000 | ORAL_TABLET | Freq: Two times a day (BID) | ORAL | Status: DC

## 2015-04-23 NOTE — Progress Notes (Signed)
Presenting complaint;  Recent bump in transaminases. History of steatorrhea.  Database:  Patient is 54 year old African-American male who has multiple medical problems including multiple myeloma(in remission on maintenance therapy), diabetes mellitus morbid obesity, GERD, fatty liver, cholelithiasis and steatorrhea. He is also felt to have IBS but did not respond to dicyclomine. His steatorrhea was reconfirmed with second fecal fat analysis. He was last seen on 01/16/2015. He was noted to have significant bump in his transaminases and therefore Dr. Whitney Muse wanted him to be seen earlier than his scheduled appointment next month.  Prior GI studies as follows.  Colonoscopy on 11/28/2011 Normal terminal ileum and normal colonic mucosa. Small polyp removed from rectosigmoid junction. Biopsy revealed normal ileal mucosa and normal lining mucosa. Polyp was hyperplastic.  EGD with duodenal biopsy on 01/02/2012. Small sliding hiatal hernia and postsurgical changes to proximal stomach second to previous gastric banding. Food debris noted in the stomach with patent pylorus. Duodenal biopsy negative for celiac disease.  EGD on 09/20/2013 Small sliding hiatal hernia with mild changes of reflux esophagitis limited to GE junction. Healed prepyloric ulcer and altered anatomy to upper GI tract secondary to failed gastric lap band surgery.  Pancreatic EUS on 10/07/2012 by Dr. Ardis Hughs. Retained food debris in the stomach. Diffuse abnormality to pancreatic parenchyma with numerous hyperechoic strands, foci and overall honeycomb appearance suggestive of chronic pancreatitis. CBD normal without filling defects. Gallbladder without cholelithiasis.  Ultrasound on 06/22/2014 revealed cholelithiasis. Possible right upper pole renal mass.  MR abdomen on 07/28/2014 negative for renal mass. Reveals multiple small cysts in both kidneys as well as gallstone.   Subjective:  Patient is accompanied by his  wife. He was evaluated in emergency room on 02/25/2015 for chest and upper abdominal pain which started night before. He had nausea and shortness of breath. Troponin levels were normal. AST was 20 and ALT was 35 but his serum lipase was 293. WBC was 11.3 and H&H was 10.5 and 32.1. Platelet count was 125K. Abdominopelvic CT without contrast was obtained and reveals stable left diaphragmatic hernia as well as small sliding hiatal hernia and cholelithiasis. There was no evidence of pancreatitis. Bilateral perinephric stranding noted as on prior studies. Patient well better and was discharged. He had LFTs on 03/08/2015. AST was 178 and ALT was 178. AST and ALT were 183 and 187 respectively on 03/30/2015 but dropped to 50 and 70 respectively on 04/19/2015. Patient has intermittent epigastric pain and bloating. He continues to complain of diarrhea. On his good days he has 3-4 bowel movements per day and on his bad days he has as many as 6. He also has nocturnal bowel movement. He complains of postprandial diarrhea. Dicyclomine at low dose did not help in the past. It was therefore stopped. He takes diphenoxylate 2 tablets twice daily and needs new prescription. He denies melena or rectal bleeding. Even though most of his stools are loose to unformed he did develop constipation and did not have a bowel movement for 2 days and then had difficulty expelling hard stool which she describes like brick. He has not lost any weight despite chronic diarrhea. He actually has gained 8 pounds in the last 3 months. He is not sure if he has more diarrhea when he is on [name] or when he is not taking this medication.    Current Medications: Outpatient Encounter Prescriptions as of 04/23/2015  Medication Sig  . acyclovir (ZOVIRAX) 400 MG tablet TAKE ONE TABLET BY MOUTH EVERY MORNING.  Marland Kitchen allopurinol (ZYLOPRIM) 300 MG  tablet TAKE ONE TABLET BY MOUTH ONCE DAILY.  Marland Kitchen atorvastatin (LIPITOR) 80 MG tablet TAKE ONE TABLET BY  MOUTH AT BEDTIME.  . calcitRIOL (ROCALTROL) 0.25 MCG capsule Take 0.25 mcg by mouth 3 (three) times a week.   . calcium carbonate (OS-CAL) 600 MG TABS tablet Take 1,800 mg by mouth 2 (two) times daily.   Marland Kitchen dexamethasone (DECADRON) 4 MG tablet TAKE 5 TABLETS BY MOUTH 2 TIMES A DAY EVERY FRIDAY.  Marland Kitchen dexlansoprazole (DEXILANT) 60 MG capsule Take 1 capsule (60 mg total) by mouth daily.  . diphenoxylate-atropine (LOMOTIL) 2.5-0.025 MG per tablet Take 2 tablets by mouth 2 (two) times daily.  Marland Kitchen HYDROcodone-acetaminophen (NORCO/VICODIN) 5-325 MG per tablet Take 1-2 tablets by mouth every 4 (four) hours as needed for moderate pain or severe pain.  Marland Kitchen insulin detemir (LEVEMIR) 100 UNIT/ML injection Inject 40 Units into the skin every morning.   . insulin lispro (HUMALOG) 100 UNIT/ML injection Inject 5-30 Units into the skin 2 (two) times daily with a meal. Sliding Scale per patient  . lenalidomide ([name]) 5 MG capsule Take 1 capsule (5 mg total) by mouth daily. Take 1 tablet PO 7 days on and 7 days off.  . lipase/protease/amylase (CREON) 36000 UNITS CPEP capsule 5 capsules with each meal and 2 with each snack (Patient taking differently: 4 capsules with each meal and 2 with each snack)  . lisinopril (PRINIVIL,ZESTRIL) 5 MG tablet Take 5 mg by mouth every other day.  . loratadine (CLARITIN) 10 MG tablet Take 10 mg by mouth every morning.   . metoprolol succinate (TOPROL-XL) 25 MG 24 hr tablet TAKE ONE TABLET BY MOUTH ONCE DAILY.  . Multiple Vitamins-Minerals (MULTIVITAMINS THER. W/MINERALS) TABS Take 1 tablet by mouth daily.   . nitroGLYCERIN (NITROSTAT) 0.4 MG SL tablet Place 1 tablet (0.4 mg total) under the tongue every 5 (five) minutes as needed for chest pain.  . potassium chloride SA (K-DUR,KLOR-CON) 20 MEQ tablet TAKE 2 TABLETS BY MOUTH TWICE DAILY.  . sodium bicarbonate 650 MG tablet Take 1,300 mg by mouth 3 (three) times daily.  Marland Kitchen sulfamethoxazole-trimethoprim (BACTRIM DS,SEPTRA DS) 800-160 MG per  tablet TAKE ONE TABLET BY MOUTH EVERY MONDAY, WEDNESDAY, AND FRIDAY.  Marland Kitchen torsemide (DEMADEX) 20 MG tablet TAKE 2 TABLETS BY MOUTH EACH MORNING AND 1 TABLET EACH EVENING.  . Vitamin D, Ergocalciferol, (DRISDOL) 50000 UNITS CAPS capsule TAKE 1 CAPSULE BY MOUTH WEEKLY FOR 4 WEEKS; THEN TAKE 1 CAPSULE MONTHLY THEREAFTER.  Alveda Reasons 20 MG TABS tablet TAKE 1 TABLET BY MOUTH DAILY.  Marland Kitchen dicyclomine (BENTYL) 10 MG capsule Take 1 capsule (10 mg total) by mouth 3 (three) times daily before meals. (Patient not taking: Reported on 04/23/2015)  . ranitidine (ZANTAC) 150 MG tablet Take 150 mg by mouth once.   Facility-Administered Encounter Medications as of 04/23/2015  Medication  . sodium chloride 0.9 % injection 10 mL     Objective: Blood pressure 108/76, pulse 74, temperature 98.6 F (37 C), temperature source Oral, resp. rate 18, height 6' (1.829 m), weight 356 lb 9.6 oz (161.753 kg). Patient is alert and in no acute distress. Conjunctiva is pink. Sclera is nonicteric Oropharyngeal mucosa is normal. No neck masses or thyromegaly noted. Cardiac exam with regular rhythm normal S1 and S2. No murmur or gallop noted. Lungs are clear to auscultation. Abdomen is obese. Bowel sounds are normal. He has mild midepigastric tenderness. Percussion note is very tympanitic and epigastric region. Difficult to palpate liver or spleen.  He has 1+ edema involving  right leg and 2+ involving left leg. He is wearing left surgical boot.  Labs/studies Results: Lab data from 04/19/2015  Serum sodium 142, potassium 3.9, chloride 113, CO2 22, glucose 144, BUN 27, creatinine 1.69  Serum calcium 8.6  Bilirubin 0.6, AP 74, AST 50 and ALT 70.  WBC 7.1, H&H 9.6 and 30.3, MCV 99.7 and platelet count 142K.  Hepatitis B surface antigen negative on 03/19/2011 Hepatitis C virus antibody nonreactive on 03/19/2011   Assessment:  #1. Elevated transaminases. Review of patient's lab studies reveals that he has had normal  transaminases until June 2013 and since then transaminases have been mildly elevated intermittently. Mild elevation of transaminases felt to be secondary to fatty liver. He has had 3 occasions. Transaminases are above 100 including one from about 6 weeks ago. Last episode most likely secondary to passage of common duct stone since he also had elevated lipase when he was seen in emergency room on 02/25/2015 for chest and abdominal pain. Now AST and ALT are mildly elevated which appears to be is baseline and most likely due to fatty liver. Pattern is not consistent with drug-induced liver injury.   #2. Steatorrhea. Steatorrhea has been well documented by stool studies. Pancreatic EUS in May 2014 revealed abnormal appearance to pancreas consistent with chronic pancreatitis. He has not responded to pancreatic enzyme supplement. He appears to have more than one mechanism of his diarrhea. He also appears to have IBS.  #3. Anemia. H&H has been drifting down. No evidence of overt GI bleed. Given history of bariatric surgery and steatorrhea he could have B12 and/or folate deficiency.  #4. Left diaphragmatic hernia. He has had this hernia since 2010. Small portion of his colon is up in his chest. He has not experienced any symptoms. We will continue to monitor him.  #5. Chronic GERD. Heartburn is well controlled with therapy.   Plan:  Upper abdominal ultrasound. If he has dilated bile duct or gallbladder wall thickening he would benefit from cholecystectomy. Dicyclomine 20 mg by mouth 30 minutes before each meal. He should have serum B12 and folate levels with next blood draw at oncology clinic. Repeat 24-hour quantitative fecal fat analysis after the holidays while on treatment to determine response to therapy. Patient advised to call office if he has another episode of chest or abdominal pain. Office visit in 3 months.

## 2015-04-23 NOTE — Patient Instructions (Signed)
Physician will call with results of ultrasound and completed. Will arrange for 24-hour fecal fat analysis near future.

## 2015-05-01 ENCOUNTER — Ambulatory Visit (HOSPITAL_COMMUNITY)
Admission: RE | Admit: 2015-05-01 | Discharge: 2015-05-01 | Disposition: A | Discharge status: Home / Self Care | Payer: 59 | Source: Ambulatory Visit | Attending: Internal Medicine | Admitting: Internal Medicine

## 2015-05-01 DIAGNOSIS — R1011 Right upper quadrant pain: Secondary | ICD-10-CM | POA: Diagnosis not present

## 2015-05-01 DIAGNOSIS — K8020 Calculus of gallbladder without cholecystitis without obstruction: Secondary | ICD-10-CM | POA: Insufficient documentation

## 2015-05-01 DIAGNOSIS — R1013 Epigastric pain: Secondary | ICD-10-CM | POA: Diagnosis not present

## 2015-05-01 DIAGNOSIS — R7989 Other specified abnormal findings of blood chemistry: Secondary | ICD-10-CM | POA: Diagnosis not present

## 2015-05-01 DIAGNOSIS — R945 Abnormal results of liver function studies: Secondary | ICD-10-CM

## 2015-05-07 ENCOUNTER — Other Ambulatory Visit (HOSPITAL_COMMUNITY): Payer: Self-pay | Admitting: Oncology

## 2015-05-07 DIAGNOSIS — C9001 Multiple myeloma in remission: Secondary | ICD-10-CM

## 2015-05-07 MED ORDER — LENALIDOMIDE 5 MG PO CAPS: 5.0000 mg | ORAL_CAPSULE | Freq: Every day | ORAL | Status: DC

## 2015-05-11 ENCOUNTER — Other Ambulatory Visit: Payer: Self-pay | Admitting: Internal Medicine

## 2015-05-11 ENCOUNTER — Other Ambulatory Visit: Payer: Self-pay | Admitting: Cardiovascular Disease

## 2015-05-15 ENCOUNTER — Ambulatory Visit (INDEPENDENT_AMBULATORY_CARE_PROVIDER_SITE_OTHER): Payer: 59 | Admitting: Internal Medicine

## 2015-05-18 ENCOUNTER — Ambulatory Visit (HOSPITAL_COMMUNITY): Payer: 59 | Admitting: Oncology

## 2015-05-18 NOTE — Assessment & Plan Note (Deleted)
Stage II, multiple myeloma (kappa light chain specificity) with no evidence of serum m-protein; S/P [facility] evaluate for transplant and not felt to be a candidate.  Bone marrow aspiration and biopsy at the time of diagnosis demonstrated 19% plasma cells and urine SPEP demonstrated 3 g of bence-jones proteins.  Bone marrow aspiration and biopsy on 09/28/2014 showed variably cellular marrow with trilineage hematopoiesis and 2% plasma cells. IHC confirmed the presence of a minor plasma cell component which appears to show polyclonal staining pattern for Kapp and lambda light chain. Latter appearance is nonspecific and not diagnostic of a plasma cell neoplasm. Background shows trilineage hematopoiesis with nonspecific changes.  He has seen Dr. Laural Golden 2-3 weeks ago for an elevation of transaminases (above baseline).  His note is appreciated.  In summary, he believes that the acute elevation is secondary to passage of common duct stone passage.  I personally reviewed and went over radiographic studies with the patient.  The results are noted within this dictation.  [place] of abdomen demonstrated cholelithiasis again noted without evidence of cholecystitis and borderline dilation of common bile duct measuring 7 mm.  Discontinued his Zometa because of the concern of osteonecrosis of the jaw. He states he has an upcoming appointment with a dentist. His last dose of Zometa was in April.  He has continued [name]/Dexamethasone, but he has continued as he is uncomfortable discontinuing due to his mother's history of passing from MM.  His M-spike is not detectable and bone marrow aspiration and biopsy demonstrated less than 2% plasma cells.  Additionally his blood counts have been suppressed.  It is felt that continuation of dexamethasone is not necessary, but again, he wishes to continue.  Labs today: CBC diff, CMET, SPEP+IFE, light chain assay, B12, folate, and PSA.  Return in 8 weeks for labs: CBC diff, CMET, SPEP+IFE,  and light chain assay.  Return in 8 weeks for follow-up.

## 2015-05-18 NOTE — Progress Notes (Signed)
rescheduled

## 2015-05-29 ENCOUNTER — Encounter (HOSPITAL_COMMUNITY): Payer: Self-pay | Admitting: Oncology

## 2015-05-29 ENCOUNTER — Encounter (HOSPITAL_COMMUNITY): Payer: Managed Care, Other (non HMO) | Attending: Oncology | Admitting: Oncology

## 2015-05-29 VITALS — BP 147/74 | HR 56 | Temp 98.0°F | Resp 18 | Wt 356.8 lb

## 2015-05-29 DIAGNOSIS — C9001 Multiple myeloma in remission: Secondary | ICD-10-CM | POA: Diagnosis not present

## 2015-05-29 DIAGNOSIS — C9000 Multiple myeloma not having achieved remission: Secondary | ICD-10-CM | POA: Insufficient documentation

## 2015-05-29 NOTE — Patient Instructions (Signed)
[place] at [facility] Discharge Instructions  RECOMMENDATIONS MADE BY THE CONSULTANT AND ANY TEST RESULTS WILL BE SENT TO YOUR REFERRING PHYSICIAN.  Exam and discussion by Robynn Pane, PA-C You are doing well Call with any concerns.  Follow-up as scheduled for port flush with labs Office visit and labs in 8 weeks.  Thank you for choosing [place] at [facility] to provide your oncology and hematology care.  To afford each patient quality time with our provider, please arrive at least 15 minutes before your scheduled appointment time.    You need to re-schedule your appointment should you arrive 10 or more minutes late.  We strive to give you quality time with our providers, and arriving late affects you and other patients whose appointments are after yours.  Also, if you no show three or more times for appointments you may be dismissed from the clinic at the providers discretion.     Again, thank you for choosing [facility].  Our hope is that these requests will decrease the amount of time that you wait before being seen by our physicians.       _____________________________________________________________  Should you have questions after your visit to [facility], please contact our office at [phone] between the hours of 8:30 a.m. and 4:30 p.m.  Voicemails left after 4:30 p.m. will not be returned until the following business day.  For prescription refill requests, have your pharmacy contact our office.

## 2015-05-29 NOTE — Progress Notes (Signed)
Franklin Font, MD [address] [place] [address]  Multiple myeloma in remission [facility])  CURRENT THERAPY: [name] 5 mg daily (7 days off and 7 days on) and Dexamethasone (20 mg on Fridays)  INTERVAL HISTORY: [name] 54 y.o. male returns for followup of Stage II, multiple myeloma (kappa light chain specificity) with no evidence of serum m-protein; S/P [facility] evaluate for transplant and not felt to be a candidate.  Bone marrow aspiration and biopsy at the time of diagnosis demonstrated 19% plasma cells and urine SPEP demonstrated 3 g of bence-jones proteins.  Bone marrow aspiration and biopsy on 09/28/2014 showed variably cellular marrow with trilineage hematopoiesis and 2% plasma cells. IHC confirmed the presence of a minor plasma cell component which appears to show polyclonal staining pattern for Kapp and lambda light chain. Latter appearance is nonspecific and not diagnostic of a plasma cell neoplasm. Background shows trilineage hematopoiesis with nonspecific changes.  He saw Dr. Laural Golden, sooner than planned, at the request of Dr. Whitney Muse due to significant increase in his transaminases.  [name] has a baseline elevation of transaminases secondary to steatorrhea.  Most recently, he has had transaminases greater than 100.  Per Dr. Laural Golden: most likely secondary to passage of common duct stone since he also had elevated lipase when he was seen in emergency room on 02/25/2015 for chest and abdominal pain. Now AST and ALT are mildly elevated which appears to be is baseline and most likely due to fatty liver. Pattern is not consistent with drug-induced liver injury.  I personally reviewed and went over laboratory results with the patient.  The results are noted within this dictation.  We will update labs as scheduled with the addition of Folate and B12 testing.  I personally reviewed and went over radiographic studies with the patient.  The results are noted within this  dictation.  [place] of abd on 11/29 shows repeat cholelithiasis again noted without indication of signs of acute cholecystitis.  There is borderline dilation of common bile duct measuring 7 mm.  "What's the status of my cancer."  I reviewed his past bone marrow results with him and his lab work.  His MM is in remission at this time.  I reviewed his past blood counts with him and he is noted to have an anemia and thrombocytopenia.  This very well could be secondary to his continued treatment with [name].  As discussed before, it would certainly be appropriate for him to stop his Dex and [name] (particularly in light of his DM).  He wishes to continue with this treatment because his experienced complications from the medication are not something he cannot overcome, he states.  He wishes to continue.  He notes fatigue today, which is likely multifactorial with an element of anemia, MM treatment, and other co-morbidities.   Past Medical History  Diagnosis Date  . Arteriosclerotic cardiovascular disease (ASCVD)     MI-2000s; stent to the proximal LAD and diagonal in 2001; stress nuclear in 2008-impaired exercise capacity, left ventricular dilatation, moderately to severely depressed EF, apical, inferior and anteroseptal scar  . Hypertension   . 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 Diabetes mellitus     Insulin  . GERD (gastroesophageal reflux disease)   . Pedal edema     Venous insufficiency  . Obesity   . Hyperlipidemia   . Injection site reaction   . Cellulitis of leg     both legs  . Chronic  kidney disease, stage 3, mod decreased GFR     Creatinine of 1.84 in 06/2011 and 1.5 in 07/2011  . Chronic diarrhea   . Sleep apnea     uses cpap  . Ulcer   . Atrial flutter ([place])   . Multiple myeloma 07/01/2011  . Myocardial infarction ([place]) 2000  . Gout     has DIABETES MELLITUS, TYPE II, ON INSULIN; Morbid obesity ([place]); SLEEP APNEA; Multiple myeloma ([place]); Arteriosclerotic cardiovascular disease  (ASCVD); Hypertension; Hyperlipidemia; DDD (degenerative disc disease), cervical; Atrial flutter ([place]); Chronic pancreatitis ([place]); Anemia, normocytic normochromic; and Chronic systolic heart failure ([place]) on his problem list.     has No Known Allergies.  Current Outpatient Prescriptions on File Prior to Visit  Medication Sig Dispense Refill  . acyclovir (ZOVIRAX) 400 MG tablet TAKE ONE TABLET BY MOUTH EVERY MORNING. 30 tablet 5  . allopurinol (ZYLOPRIM) 300 MG tablet TAKE ONE TABLET BY MOUTH ONCE DAILY. 30 tablet 5  . atorvastatin (LIPITOR) 80 MG tablet TAKE ONE TABLET BY MOUTH AT BEDTIME. 30 tablet 11  . calcitRIOL (ROCALTROL) 0.25 MCG capsule Take 0.25 mcg by mouth 3 (three) times a week.     . calcium carbonate (OS-CAL) 600 MG TABS tablet Take 1,800 mg by mouth 2 (two) times daily.     Marland Kitchen dexamethasone (DECADRON) 4 MG tablet TAKE 5 TABLETS BY MOUTH 2 TIMES A DAY EVERY FRIDAY. 40 tablet 5  . dexlansoprazole (DEXILANT) 60 MG capsule Take 1 capsule (60 mg total) by mouth daily. 30 capsule 0  . dicyclomine (BENTYL) 20 MG tablet Take 1 tablet (20 mg total) by mouth every 6 (six) hours. 90 tablet 5  . diphenoxylate-atropine (LOMOTIL) 2.5-0.025 MG tablet Take 2 tablets by mouth 2 (two) times daily. 120 tablet 5  . HYDROcodone-acetaminophen (NORCO/VICODIN) 5-325 MG per tablet Take 1-2 tablets by mouth every 4 (four) hours as needed for moderate pain or severe pain. 90 tablet 0  . insulin detemir (LEVEMIR) 100 UNIT/ML injection Inject 40 Units into the skin every morning.     . insulin lispro (HUMALOG) 100 UNIT/ML injection Inject 5-30 Units into the skin 2 (two) times daily with a meal. Sliding Scale per patient    . lenalidomide ([name]) 5 MG capsule Take 1 capsule (5 mg total) by mouth daily. Take 1 tablet PO 7 days on and 7 days off. 14 capsule 1  . lipase/protease/amylase (CREON) 36000 UNITS CPEP capsule 5 capsules with each meal and 2 with each snack (Patient taking differently: 4 capsules with  each meal and 2 with each snack) 570 capsule 5  . lisinopril (PRINIVIL,ZESTRIL) 5 MG tablet Take 5 mg by mouth every other day.    . loratadine (CLARITIN) 10 MG tablet Take 10 mg by mouth every morning.     . metoprolol succinate (TOPROL-XL) 25 MG 24 hr tablet TAKE ONE TABLET BY MOUTH ONCE DAILY. 30 tablet 6  . Multiple Vitamins-Minerals (MULTIVITAMINS THER. W/MINERALS) TABS Take 1 tablet by mouth daily.     . nitroGLYCERIN (NITROSTAT) 0.4 MG SL tablet Place 1 tablet (0.4 mg total) under the tongue every 5 (five) minutes as needed for chest pain. 25 tablet 3  . potassium chloride SA (K-DUR,KLOR-CON) 20 MEQ tablet TAKE 2 TABLETS BY MOUTH TWICE DAILY. 120 tablet 3  . sodium bicarbonate 650 MG tablet Take 1,300 mg by mouth 3 (three) times daily.    Marland Kitchen sulfamethoxazole-trimethoprim (BACTRIM DS,SEPTRA DS) 800-160 MG per tablet TAKE ONE TABLET BY MOUTH EVERY MONDAY, WEDNESDAY, AND FRIDAY.  12 tablet 5  . torsemide (DEMADEX) 20 MG tablet TAKE 2 TABLETS BY MOUTH EACH MORNING AND 1 TABLET EACH EVENING. 240 tablet 3  . Vitamin D, Ergocalciferol, (DRISDOL) 50000 UNITS CAPS capsule TAKE 1 CAPSULE BY MOUTH WEEKLY FOR 4 WEEKS; THEN TAKE 1 CAPSULE MONTHLY THEREAFTER. 12 capsule 0  . XARELTO 20 MG TABS tablet TAKE 1 TABLET BY MOUTH DAILY. 30 tablet 6   Current Facility-Administered Medications on File Prior to Visit  Medication Dose Route Frequency Provider Last Rate Last Dose  . sodium chloride 0.9 % injection 10 mL  10 mL Intravenous Once Farrel Gobble, MD        Past Surgical History  Procedure Laterality Date  . Laparoscopic gastric banding  2006    has been removed  . Wrist surgery      Left; removal of bone fragment  . Incision and drainage abscess anal    . Abscess drainage      Scrotal  . Bone marrow biopsy  05/13/11  . Portacath placement  07/07/2011    Procedure: INSERTION PORT-A-CATH;  Surgeon: Scherry Ran, MD;  Location: [facility];  Service: General;  Laterality: N/A;  . Colonoscopy   11/28/2011    Procedure: COLONOSCOPY;  Surgeon: Rogene Houston, MD;  Location: [facility];  Service: Endoscopy;  Laterality: N/A;  930  . Esophagogastroduodenoscopy  01/02/2012    Procedure: ESOPHAGOGASTRODUODENOSCOPY (EGD);  Surgeon: Rogene Houston, MD;  Location: [facility];  Service: Endoscopy;  Laterality: N/A;  100  . Esophageal biopsy  01/02/2012    Procedure: BIOPSY;  Surgeon: Rogene Houston, MD;  [facility]: [facility];  Service: Endoscopy;  Laterality: N/A;  . Esophagogastroduodenoscopy N/A 09/20/2012    Procedure: ESOPHAGOGASTRODUODENOSCOPY (EGD);  Surgeon: Rogene Houston, MD;  Location: [facility];  Service: Endoscopy;  Laterality: N/A;  . Eus N/A 10/07/2012    Procedure: UPPER ENDOSCOPIC ULTRASOUND (EUS) LINEAR;  Surgeon: Milus Banister, MD;  Location: [facility];  Service: Endoscopy;  Laterality: N/A;  . Cardioversion N/A 10/13/2012    Procedure: CARDIOVERSION;  Surgeon: Yehuda Savannah, MD;  Location: [facility];  Service: Cardiovascular;  Laterality: N/A;  . Cardiac catheterization      cardiac stent  . Port-a-cath removal Left 12/07/2012    Procedure: REMOVAL PORT-A-CATH;  Surgeon: Scherry Ran, MD;  Location: [facility];  Service: General;  Laterality: Left;  . Portacath placement N/A 12/07/2012    Procedure: INSERTION PORT-A-CATH;  Surgeon: Scherry Ran, MD;  Location: [facility];  Service: General;  Laterality: N/A;  Attempted portacath placement on left and right side  . Cataract extraction w/phaco Left 02/13/2014    Procedure: CATARACT EXTRACTION PHACO AND INTRAOCULAR LENS PLACEMENT (IOC);  Surgeon: Tonny Branch, MD;  Location: [facility];  Service: Ophthalmology;  Laterality: Left;  CDE:  7.67  . Cataract extraction w/phaco Right 03/02/2014    Procedure: CATARACT EXTRACTION PHACO AND INTRAOCULAR LENS PLACEMENT RIGHT EYE CDE=16.81;  Surgeon: Tonny Branch, MD;  Location: [facility];  Service: Ophthalmology;  Laterality: Right;    Denies any headaches, dizziness, double vision,  fevers, chills, night sweats, nausea, vomiting, diarrhea, constipation, chest pain, heart palpitations, shortness of breath, blood in stool, black tarry stool, urinary pain, urinary burning, urinary frequency, hematuria.   PHYSICAL EXAMINATION  ECOG PERFORMANCE STATUS: 1 - Symptomatic but completely ambulatory  Filed Vitals:   05/29/15 1443  BP: 147/74  Pulse: 56  Temp: 98 F (36.7 C)  Resp: 18  GENERAL:alert, no distress, comfortable, cooperative, obese, smiling and unaccompanied today. SKIN: skin color, texture, turgor are normal, no rashes or significant lesions HEAD: Normocephalic, No masses, lesions, tenderness or abnormalities EYES: normal, PERRLA, EOMI, Conjunctiva are pink and non-injected EARS: External ears normal OROPHARYNX:lips, buccal mucosa, and tongue normal and mucous membranes are moist  NECK: supple, trachea midline LYMPH:  no palpable lymphadenopathy BREAST:not examined LUNGS: clear to auscultation  HEART: regular rate & rhythm, no murmurs and no gallops ABDOMEN:abdomen soft, non-tender, obese and normal bowel sounds BACK: Back symmetric, no curvature., No CVA tenderness EXTREMITIES:less then 2 second capillary refill, no joint deformities, effusion, or inflammation, no skin discoloration, no cyanosis, left foot in walking boot NEURO: alert & oriented x 3 with fluent speech, no focal motor/sensory deficits     LABORATORY DATA: CBC    Component Value Date/Time   WBC 7.1 04/19/2015 1404   RBC 3.04* 04/19/2015 1404   HGB 9.6* 04/19/2015 1404   HCT 30.3* 04/19/2015 1404   PLT 142* 04/19/2015 1404   MCV 99.7 04/19/2015 1404   MCH 31.6 04/19/2015 1404   MCHC 31.7 04/19/2015 1404   RDW 15.2 04/19/2015 1404   LYMPHSABS 1.9 04/19/2015 1404   MONOABS 1.0 04/19/2015 1404   EOSABS 0.2 04/19/2015 1404   BASOSABS 0.0 04/19/2015 1404      Chemistry      Component Value Date/Time   NA 142 04/19/2015 1404   K 3.9 04/19/2015 1404   CL 113* 04/19/2015 1404     CO2 22 04/19/2015 1404   BUN 27* 04/19/2015 1404   CREATININE 1.69* 04/19/2015 1404   CREATININE 2.24* 10/05/2012 1411      Component Value Date/Time   CALCIUM 8.6* 04/19/2015 1404   CALCIUM 8.8 03/08/2015 1410   ALKPHOS 74 04/19/2015 1404   AST 50* 04/19/2015 1404   ALT 70* 04/19/2015 1404   BILITOT 0.6 04/19/2015 1404        PENDING LABS:   RADIOGRAPHIC STUDIES:  US Abdomen Complete  05/01/2015  CLINICAL DATA:  Right upper quadrant and epigastric pain for approximately 1 year. Elevated liver function tests. EXAM: ULTRASOUND ABDOMEN COMPLETE COMPARISON:  06/22/2014 FINDINGS: Gallbladder: A small gallstone is again seen measuring 1.1 cm. No evidence of gallbladder wall thickening or pericholecystic fluid. No sonographic Murphy sign noted by sonographer. Common bile duct: Diameter: 7 mm, which is considered borderline dilated. Liver: No focal lesion identified. Within normal limits in parenchymal echogenicity. No definite dilatation of intrahepatic bile ducts seen. IVC: No abnormality visualized. Pancreas: Visualized portion unremarkable. Spleen: Size and appearance within normal limits. Right Kidney: Length: 13.1 cm. Echogenicity within normal limits. No mass or hydronephrosis visualized. Left Kidney: Length: 14.4 cm. Echogenicity within normal limits. No mass or hydronephrosis visualized. Abdominal aorta: Not well visualized due to overlying bowel gas. Other findings: None. IMPRESSION: Cholelithiasis again noted. No sonographic signs of acute cholecystitis. Borderline dilatation of common bile duct measuring 7 mm. Electronically Signed   By: Earle Gell M.D.   On: 05/01/2015 09:13     PATHOLOGY:    ASSESSMENT AND PLAN:  Multiple myeloma Stage II, multiple myeloma (kappa light chain specificity) with no evidence of serum m-protein; S/P [facility] evaluate for transplant and not felt to be a candidate.  Bone marrow aspiration and biopsy at the time of diagnosis demonstrated 19% plasma  cells and urine SPEP demonstrated 3 g of bence-jones proteins.  Bone marrow aspiration and biopsy on 09/28/2014 showed variably cellular marrow with trilineage hematopoiesis and 2% plasma cells. IHC  confirmed the presence of a minor plasma cell component which appears to show polyclonal staining pattern for Kapp and lambda light chain. Latter appearance is nonspecific and not diagnostic of a plasma cell neoplasm. Background shows trilineage hematopoiesis with nonspecific changes.  He has seen Dr. Laural Golden 4 weeks ago for an elevation of transaminases (above baseline).  His note is appreciated.  In summary, he believes that the acute elevation is secondary to passage of common duct stone passage.  I personally reviewed and went over radiographic studies with the patient.  The results are noted within this dictation.  [place] of abdomen demonstrated cholelithiasis again noted without evidence of cholecystitis and borderline dilation of common bile duct measuring 7 mm.  Discontinued his Zometa because of the concern of osteonecrosis of the jaw. He states he has an upcoming appointment with a dentist. His last dose of Zometa was in April.  He notes an upcoming appointment in Jan 2017.  He has continued [name]/Dexamethasone, but he has continued as he is uncomfortable discontinuing due to his mother's history of passing from MM.  His M-spike is not detectable and bone marrow aspiration and biopsy demonstrated less than 2% plasma cells.  Additionally his blood counts have been suppressed.  It is felt that continuation of dexamethasone is not necessary, but again, he wishes to continue.  This was re-addressed today and he notes that he has more good days than bad days and all side effects/adverse reactions are not enough to overcome his desire to continue therapy.  Labs as scheduled in Jan 2017: CBC diff, CMET, SPEP+IFE, light chain assay, B12, folate, and PSA.  Return in 8 weeks for labs: CBC diff, CMET, SPEP+IFE,  and light chain assay.  He has an appointment with Dr. Caprice Beaver later today at 4:30 PM  Return in 8 weeks for follow-up.      THERAPY PLAN:  Continue maintenance as planned.    All questions were answered. The patient knows to call the clinic with any problems, questions or concerns. We can certainly see the patient much sooner if necessary.  Patient and plan discussed with Dr. Ancil Linsey and she is in agreement with the aforementioned.   This note is electronically signed by: Doy Mince 05/29/2015 3:36 PM

## 2015-05-29 NOTE — Assessment & Plan Note (Addendum)
Stage II, multiple myeloma (kappa light chain specificity) with no evidence of serum m-protein; S/P [facility] evaluate for transplant and not felt to be a candidate.  Bone marrow aspiration and biopsy at the time of diagnosis demonstrated 19% plasma cells and urine SPEP demonstrated 3 g of bence-jones proteins.  Bone marrow aspiration and biopsy on 09/28/2014 showed variably cellular marrow with trilineage hematopoiesis and 2% plasma cells. IHC confirmed the presence of a minor plasma cell component which appears to show polyclonal staining pattern for Kapp and lambda light chain. Latter appearance is nonspecific and not diagnostic of a plasma cell neoplasm. Background shows trilineage hematopoiesis with nonspecific changes.  He has seen Dr. Laural Golden 4 weeks ago for an elevation of transaminases (above baseline).  His note is appreciated.  In summary, he believes that the acute elevation is secondary to passage of common duct stone passage.  I personally reviewed and went over radiographic studies with the patient.  The results are noted within this dictation.  [place] of abdomen demonstrated cholelithiasis again noted without evidence of cholecystitis and borderline dilation of common bile duct measuring 7 mm.  Discontinued his Zometa because of the concern of osteonecrosis of the jaw. He states he has an upcoming appointment with a dentist. His last dose of Zometa was in April.  He notes an upcoming appointment in Jan 2017.  He has continued [name]/Dexamethasone, but he has continued as he is uncomfortable discontinuing due to his mother's history of passing from MM.  His M-spike is not detectable and bone marrow aspiration and biopsy demonstrated less than 2% plasma cells.  Additionally his blood counts have been suppressed.  It is felt that continuation of dexamethasone is not necessary, but again, he wishes to continue.  This was re-addressed today and he notes that he has more good days than bad days and all side  effects/adverse reactions are not enough to overcome his desire to continue therapy.  Labs as scheduled in Jan 2017: CBC diff, CMET, SPEP+IFE, light chain assay, B12, folate, and PSA.  Return in 8 weeks for labs: CBC diff, CMET, SPEP+IFE, and light chain assay.  He has an appointment with Dr. Caprice Beaver later today at 4:30 PM  Return in 8 weeks for follow-up.

## 2015-05-30 ENCOUNTER — Other Ambulatory Visit (HOSPITAL_COMMUNITY): Payer: Self-pay | Admitting: Oncology

## 2015-06-02 ENCOUNTER — Other Ambulatory Visit (HOSPITAL_COMMUNITY): Payer: Self-pay | Admitting: Oncology

## 2015-06-06 ENCOUNTER — Other Ambulatory Visit (HOSPITAL_COMMUNITY): Payer: Self-pay | Admitting: Oncology

## 2015-06-06 ENCOUNTER — Telehealth (INDEPENDENT_AMBULATORY_CARE_PROVIDER_SITE_OTHER): Payer: Self-pay | Admitting: Internal Medicine

## 2015-06-06 NOTE — Telephone Encounter (Signed)
[name] left a message saying he needs "creon" (sp?) samples. It was hard to understand on the phone but that's what it sound like. Please give him a phone call.  Pt's ph# [phone] Thank you.

## 2015-06-06 NOTE — Telephone Encounter (Signed)
Patient was called and made aware that samples were at the front window.

## 2015-06-11 ENCOUNTER — Other Ambulatory Visit (HOSPITAL_COMMUNITY): Payer: Self-pay | Admitting: Oncology

## 2015-06-11 DIAGNOSIS — C9001 Multiple myeloma in remission: Secondary | ICD-10-CM

## 2015-06-11 MED ORDER — LENALIDOMIDE 5 MG PO CAPS: 5.0000 mg | ORAL_CAPSULE | Freq: Every day | ORAL | Status: DC

## 2015-06-12 ENCOUNTER — Encounter (INDEPENDENT_AMBULATORY_CARE_PROVIDER_SITE_OTHER): Payer: Self-pay | Admitting: Internal Medicine

## 2015-06-14 ENCOUNTER — Encounter (HOSPITAL_COMMUNITY): Payer: Managed Care, Other (non HMO) | Attending: Oncology

## 2015-06-14 ENCOUNTER — Encounter (HOSPITAL_COMMUNITY): Payer: Self-pay

## 2015-06-14 VITALS — BP 116/66 | HR 69 | Temp 97.9°F | Resp 20

## 2015-06-14 DIAGNOSIS — D649 Anemia, unspecified: Secondary | ICD-10-CM

## 2015-06-14 DIAGNOSIS — C9000 Multiple myeloma not having achieved remission: Secondary | ICD-10-CM | POA: Diagnosis not present

## 2015-06-14 DIAGNOSIS — Z452 Encounter for adjustment and management of vascular access device: Secondary | ICD-10-CM | POA: Diagnosis not present

## 2015-06-14 DIAGNOSIS — C9001 Multiple myeloma in remission: Secondary | ICD-10-CM | POA: Diagnosis not present

## 2015-06-14 DIAGNOSIS — Z95828 Presence of other vascular implants and grafts: Secondary | ICD-10-CM

## 2015-06-14 DIAGNOSIS — Z0000 Encounter for general adult medical examination without abnormal findings: Secondary | ICD-10-CM

## 2015-06-14 DIAGNOSIS — Z9884 Bariatric surgery status: Secondary | ICD-10-CM

## 2015-06-14 LAB — PSA: PSA: 0.2 ng/mL (ref 0.00–4.00)

## 2015-06-14 LAB — COMPREHENSIVE METABOLIC PANEL
ALT: 135 U/L — ABNORMAL HIGH (ref 17–63)
AST: 148 U/L — ABNORMAL HIGH (ref 15–41)
Albumin: 2.9 g/dL — ABNORMAL LOW (ref 3.5–5.0)
Alkaline Phosphatase: 122 U/L (ref 38–126)
Anion gap: 7 (ref 5–15)
BUN: 34 mg/dL — ABNORMAL HIGH (ref 6–20)
CO2: 24 mmol/L (ref 22–32)
Calcium: 8.8 mg/dL — ABNORMAL LOW (ref 8.9–10.3)
Chloride: 111 mmol/L (ref 101–111)
Creatinine, Ser: 1.85 mg/dL — ABNORMAL HIGH (ref 0.61–1.24)
GFR calc Af Amer: 45 mL/min — ABNORMAL LOW (ref >60)
GFR calc non Af Amer: 39 mL/min — ABNORMAL LOW (ref >60)
Glucose, Bld: 162 mg/dL — ABNORMAL HIGH (ref 65–99)
Potassium: 4.1 mmol/L (ref 3.5–5.1)
Sodium: 142 mmol/L (ref 135–145)
Total Bilirubin: 0.7 mg/dL (ref 0.3–1.2)
Total Protein: 5.3 g/dL — ABNORMAL LOW (ref 6.5–8.1)

## 2015-06-14 LAB — CBC WITH DIFFERENTIAL/PLATELET
Basophils Absolute: 0 10*3/uL (ref 0.0–0.1)
Basophils Relative: 0 %
Eosinophils Absolute: 0.2 10*3/uL (ref 0.0–0.7)
Eosinophils Relative: 3 %
HCT: 30.7 % — ABNORMAL LOW (ref 39.0–52.0)
Hemoglobin: 10.2 g/dL — ABNORMAL LOW (ref 13.0–17.0)
Lymphocytes Relative: 32 %
Lymphs Abs: 2.2 10*3/uL (ref 0.7–4.0)
MCH: 31.9 pg (ref 26.0–34.0)
MCHC: 33.2 g/dL (ref 30.0–36.0)
MCV: 95.9 fL (ref 78.0–100.0)
Monocytes Absolute: 0.9 10*3/uL (ref 0.1–1.0)
Monocytes Relative: 13 %
Neutro Abs: 3.6 10*3/uL (ref 1.7–7.7)
Neutrophils Relative %: 52 %
Platelets: 138 10*3/uL — ABNORMAL LOW (ref 150–400)
RBC: 3.2 MIL/uL — ABNORMAL LOW (ref 4.22–5.81)
RDW: 14.7 % (ref 11.5–15.5)
WBC: 7 10*3/uL (ref 4.0–10.5)

## 2015-06-14 LAB — FOLATE: Folate: 19.1 ng/mL (ref >5.9)

## 2015-06-14 LAB — VITAMIN B12: Vitamin B-12: 504 pg/mL (ref 180–914)

## 2015-06-14 MED ORDER — SODIUM CHLORIDE 0.9 % IJ SOLN
10.0000 mL | INTRAMUSCULAR | Status: DC | PRN
  Administered 2015-06-14: 10 mL via INTRAVENOUS
  Filled 2015-06-14: qty 10

## 2015-06-14 MED ORDER — HEPARIN SOD (PORK) LOCK FLUSH 100 UNIT/ML IV SOLN
500.0000 [IU] | Freq: Once | INTRAVENOUS | Status: AC
  Administered 2015-06-14: 500 [IU] via INTRAVENOUS

## 2015-06-14 MED ORDER — HEPARIN SOD (PORK) LOCK FLUSH 100 UNIT/ML IV SOLN
INTRAVENOUS | Status: AC
  Filled 2015-06-14: qty 5

## 2015-06-14 NOTE — Progress Notes (Signed)
[name] presented for Portacath access and flush. Portacath located right chest wall accessed with  H 20 needle. Good blood return present. Portacath flushed with 57ml NS and 500U/66ml Heparin and needle removed intact. Procedure without incident. Patient tolerated procedure well.  Labs drawn as ordered.

## 2015-06-14 NOTE — Patient Instructions (Signed)
[place] at [facility] Discharge Instructions  RECOMMENDATIONS MADE BY THE CONSULTANT AND ANY TEST RESULTS WILL BE SENT TO YOUR REFERRING PHYSICIAN.  You received your port flush with labs drawn as ordered. Come back as scheduled.  Thank you for choosing [place] at [facility] to provide your oncology and hematology care.  To afford each patient quality time with our provider, please arrive at least 15 minutes before your scheduled appointment time.    You need to re-schedule your appointment should you arrive 10 or more minutes late.  We strive to give you quality time with our providers, and arriving late affects you and other patients whose appointments are after yours.  Also, if you no show three or more times for appointments you may be dismissed from the clinic at the providers discretion.     Again, thank you for choosing [facility].  Our hope is that these requests will decrease the amount of time that you wait before being seen by our physicians.       _____________________________________________________________  Should you have questions after your visit to [facility], please contact our office at [phone] between the hours of 8:30 a.m. and 4:30 p.m.  Voicemails left after 4:30 p.m. will not be returned until the following business day.  For prescription refill requests, have your pharmacy contact our office.

## 2015-06-15 LAB — PROTEIN ELECTROPHORESIS, SERUM
A/G Ratio: 1.5 (ref 0.7–1.7)
Albumin ELP: 2.9 g/dL (ref 2.9–4.4)
Alpha-1-Globulin: 0.2 g/dL (ref 0.0–0.4)
Alpha-2-Globulin: 0.6 g/dL (ref 0.4–1.0)
Beta Globulin: 0.8 g/dL (ref 0.7–1.3)
Gamma Globulin: 0.3 g/dL — ABNORMAL LOW (ref 0.4–1.8)
Globulin, Total: 1.9 g/dL — ABNORMAL LOW (ref 2.2–3.9)
Total Protein ELP: 4.8 g/dL — ABNORMAL LOW (ref 6.0–8.5)

## 2015-06-15 LAB — IGG, IGA, IGM
IgA: 54 mg/dL — ABNORMAL LOW (ref 90–386)
IgG (Immunoglobin G), Serum: 277 mg/dL — ABNORMAL LOW (ref 700–1600)
IgM, Serum: 37 mg/dL (ref 20–172)

## 2015-06-15 LAB — KAPPA/LAMBDA LIGHT CHAINS
Kappa free light chain: 26.71 mg/L — ABNORMAL HIGH (ref 3.30–19.40)
Kappa, lambda light chain ratio: 1.43 (ref 0.26–1.65)
Lambda free light chains: 18.69 mg/L (ref 5.71–26.30)

## 2015-06-18 LAB — IMMUNOFIXATION ELECTROPHORESIS
IgA: 56 mg/dL — ABNORMAL LOW (ref 90–386)
IgG (Immunoglobin G), Serum: 304 mg/dL — ABNORMAL LOW (ref 700–1600)
IgM, Serum: 37 mg/dL (ref 20–172)
Total Protein ELP: 4.7 g/dL — ABNORMAL LOW (ref 6.0–8.5)

## 2015-06-25 ENCOUNTER — Emergency Department (HOSPITAL_COMMUNITY)
Admission: EM | Admit: 2015-06-25 | Discharge: 2015-06-25 | Disposition: A | Discharge status: Home / Self Care | Payer: Managed Care, Other (non HMO) | Attending: Emergency Medicine | Admitting: Emergency Medicine

## 2015-06-25 ENCOUNTER — Emergency Department (HOSPITAL_COMMUNITY): Payer: Managed Care, Other (non HMO)

## 2015-06-25 ENCOUNTER — Encounter (HOSPITAL_COMMUNITY): Payer: Self-pay | Admitting: *Deleted

## 2015-06-25 DIAGNOSIS — Z7901 Long term (current) use of anticoagulants: Secondary | ICD-10-CM | POA: Diagnosis not present

## 2015-06-25 DIAGNOSIS — J40 Bronchitis, not specified as acute or chronic: Secondary | ICD-10-CM

## 2015-06-25 DIAGNOSIS — E119 Type 2 diabetes mellitus without complications: Secondary | ICD-10-CM | POA: Diagnosis not present

## 2015-06-25 DIAGNOSIS — R040 Epistaxis: Secondary | ICD-10-CM | POA: Diagnosis not present

## 2015-06-25 DIAGNOSIS — J209 Acute bronchitis, unspecified: Secondary | ICD-10-CM | POA: Insufficient documentation

## 2015-06-25 DIAGNOSIS — I252 Old myocardial infarction: Secondary | ICD-10-CM | POA: Diagnosis not present

## 2015-06-25 DIAGNOSIS — Z87891 Personal history of nicotine dependence: Secondary | ICD-10-CM | POA: Insufficient documentation

## 2015-06-25 DIAGNOSIS — E669 Obesity, unspecified: Secondary | ICD-10-CM | POA: Insufficient documentation

## 2015-06-25 DIAGNOSIS — Z794 Long term (current) use of insulin: Secondary | ICD-10-CM | POA: Insufficient documentation

## 2015-06-25 DIAGNOSIS — I4892 Unspecified atrial flutter: Secondary | ICD-10-CM | POA: Insufficient documentation

## 2015-06-25 DIAGNOSIS — R109 Unspecified abdominal pain: Secondary | ICD-10-CM | POA: Insufficient documentation

## 2015-06-25 DIAGNOSIS — Z7952 Long term (current) use of systemic steroids: Secondary | ICD-10-CM | POA: Diagnosis not present

## 2015-06-25 DIAGNOSIS — Z79899 Other long term (current) drug therapy: Secondary | ICD-10-CM | POA: Diagnosis not present

## 2015-06-25 DIAGNOSIS — N183 Chronic kidney disease, stage 3 (moderate): Secondary | ICD-10-CM | POA: Diagnosis not present

## 2015-06-25 DIAGNOSIS — I2510 Atherosclerotic heart disease of native coronary artery without angina pectoris: Secondary | ICD-10-CM | POA: Diagnosis not present

## 2015-06-25 DIAGNOSIS — G4730 Sleep apnea, unspecified: Secondary | ICD-10-CM | POA: Diagnosis not present

## 2015-06-25 DIAGNOSIS — E785 Hyperlipidemia, unspecified: Secondary | ICD-10-CM | POA: Insufficient documentation

## 2015-06-25 DIAGNOSIS — Z872 Personal history of diseases of the skin and subcutaneous tissue: Secondary | ICD-10-CM | POA: Diagnosis not present

## 2015-06-25 DIAGNOSIS — K219 Gastro-esophageal reflux disease without esophagitis: Secondary | ICD-10-CM | POA: Diagnosis not present

## 2015-06-25 DIAGNOSIS — I129 Hypertensive chronic kidney disease with stage 1 through stage 4 chronic kidney disease, or unspecified chronic kidney disease: Secondary | ICD-10-CM | POA: Insufficient documentation

## 2015-06-25 DIAGNOSIS — M109 Gout, unspecified: Secondary | ICD-10-CM | POA: Diagnosis not present

## 2015-06-25 DIAGNOSIS — Z9981 Dependence on supplemental oxygen: Secondary | ICD-10-CM | POA: Diagnosis not present

## 2015-06-25 DIAGNOSIS — Z8579 Personal history of other malignant neoplasms of lymphoid, hematopoietic and related tissues: Secondary | ICD-10-CM | POA: Diagnosis not present

## 2015-06-25 DIAGNOSIS — Z9889 Other specified postprocedural states: Secondary | ICD-10-CM | POA: Diagnosis not present

## 2015-06-25 DIAGNOSIS — R05 Cough: Secondary | ICD-10-CM | POA: Diagnosis present

## 2015-06-25 MED ORDER — ALBUTEROL SULFATE (2.5 MG/3ML) 0.083% IN NEBU
2.5000 mg | INHALATION_SOLUTION | Freq: Once | RESPIRATORY_TRACT | Status: AC
  Administered 2015-06-25: 2.5 mg via RESPIRATORY_TRACT
  Filled 2015-06-25: qty 3

## 2015-06-25 MED ORDER — AZITHROMYCIN 250 MG PO TABS: ORAL_TABLET | ORAL | Status: DC

## 2015-06-25 MED ORDER — ALBUTEROL SULFATE HFA 108 (90 BASE) MCG/ACT IN AERS
2.0000 | INHALATION_SPRAY | RESPIRATORY_TRACT | Status: DC | PRN
  Administered 2015-06-25: 2 via RESPIRATORY_TRACT
  Filled 2015-06-25: qty 6.7

## 2015-06-25 MED ORDER — IPRATROPIUM-ALBUTEROL 0.5-2.5 (3) MG/3ML IN SOLN
3.0000 mL | Freq: Once | RESPIRATORY_TRACT | Status: AC
  Administered 2015-06-25: 3 mL via RESPIRATORY_TRACT
  Filled 2015-06-25: qty 3

## 2015-06-25 NOTE — ED Notes (Addendum)
Pt states congestion to chest x 2-3 days along with productive cough which is now slightly yellow in color. Unsure if he has ran a fever. Headache as well. Pt states he noticed "blotches" of blood when he blew his nose. Pt is currently being treated for multiple myeloma and was sent here from specialty clinic. Pt also states abdominal discomfort across abdomen which he believes came from coughing. The discomfort began Saturday.

## 2015-06-25 NOTE — ED Notes (Signed)
RT called

## 2015-06-25 NOTE — ED Provider Notes (Signed)
CSN: [id number]     Arrival date & time 06/25/15  3888 History  By signing my name below, I, Terressa Koyanagi, attest that this documentation has been prepared under the direction and in the presence of Milton Ferguson, MD. Electronically Signed: Terressa Koyanagi, ED Scribe. 06/25/2015. 9:37 AM.  Chief Complaint  Patient presents with  . Cough   Patient is a 55 y.o. male presenting with cough. The history is provided by the patient. No language interpreter was used.  Cough Cough characteristics:  Productive Sputum characteristics:  Yellow Severity:  Moderate Duration:  3 days Timing:  Intermittent Progression:  Worsening Chronicity:  New Smoker: no   Associated symptoms: chest pain and headaches   Associated symptoms: no eye discharge and no rash   Chest pain:    Severity:  Mild   Onset quality:  Sudden   Duration:  3 days   Timing:  Intermittent (occurs following coughing and resolves immediately )   Chronicity:  New Headaches:    Duration:  3 days   Timing:  Intermittent  PCP: [name], MD HPI Comments: [name] is a 55 y.o. male, with PMHx noted below, who presents to the Emergency Department complaining of a productive cough with yellow sputum onset 3 days ago. Associated Sx include intermittent chest pain during coughing fits, headache, nosebleed, congestion and intermittent abd pain during coughing fits.   Past Medical History  Diagnosis Date  . Arteriosclerotic cardiovascular disease (ASCVD)     MI-2000s; stent to the proximal LAD and diagonal in 2001; stress nuclear in 2008-impaired exercise capacity, left ventricular dilatation, moderately to severely depressed EF, apical, inferior and anteroseptal scar  . Hypertension   . 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 Diabetes mellitus     Insulin  . GERD (gastroesophageal reflux disease)   . Pedal edema     Venous insufficiency  . Obesity   . Hyperlipidemia   . Injection site reaction   . Cellulitis of leg    both legs  . Chronic kidney disease, stage 3, mod decreased GFR     Creatinine of 1.84 in 06/2011 and 1.5 in 07/2011  . Chronic diarrhea   . Sleep apnea     uses cpap  . Ulcer   . Atrial flutter ([place])   . Multiple myeloma 07/01/2011  . Myocardial infarction ([place]) 2000  . Gout    Past Surgical History  Procedure Laterality Date  . Laparoscopic gastric banding  2006    has been removed  . Wrist surgery      Left; removal of bone fragment  . Incision and drainage abscess anal    . Abscess drainage      Scrotal  . Bone marrow biopsy  05/13/11  . Portacath placement  07/07/2011    Procedure: INSERTION PORT-A-CATH;  Surgeon: Scherry Ran, MD;  Location: [facility];  Service: General;  Laterality: N/A;  . Colonoscopy  11/28/2011    Procedure: COLONOSCOPY;  Surgeon: Rogene Houston, MD;  [facility];  Service: Endoscopy;  Laterality: N/A;  930  . Esophagogastroduodenoscopy  01/02/2012    Procedure: ESOPHAGOGASTRODUODENOSCOPY (EGD);  Surgeon: Rogene Houston, MD;  [facility];  Service: Endoscopy;  Laterality: N/A;  100  . Esophageal biopsy  01/02/2012    Procedure: BIOPSY;  Surgeon: Rogene Houston, MD;  [facility];  Service: Endoscopy;  Laterality: N/A;  . Esophagogastroduodenoscopy N/A 09/20/2012    Procedure: ESOPHAGOGASTRODUODENOSCOPY (EGD);  Surgeon: Rogene Houston, MD;  [facility];  Service: Endoscopy;  Laterality: N/A;  . Eus N/A 10/07/2012    Procedure: UPPER ENDOSCOPIC ULTRASOUND (EUS) LINEAR;  Surgeon: Milus Banister, MD;  [facility];  Service: Endoscopy;  Laterality: N/A;  . Cardioversion N/A 10/13/2012    Procedure: CARDIOVERSION;  Surgeon: Yehuda Savannah, MD;  Location: [facility];  Service: Cardiovascular;  Laterality: N/A;  . Cardiac catheterization      cardiac stent  . Port-a-cath removal Left 12/07/2012    Procedure: REMOVAL PORT-A-CATH;  Surgeon: Scherry Ran, MD;  [facility];  Service: General;   Laterality: Left;  . Portacath placement N/A 12/07/2012    Procedure: INSERTION PORT-A-CATH;  Surgeon: Scherry Ran, MD;  [facility];  Service: General;  Laterality: N/A;  Attempted portacath placement on left and right side  . Cataract extraction w/phaco Left 02/13/2014    Procedure: CATARACT EXTRACTION PHACO AND INTRAOCULAR LENS PLACEMENT (IOC);  Surgeon: Tonny Branch, MD;  [facility];  Service: Ophthalmology;  Laterality: Left;  CDE:  7.67  . Cataract extraction w/phaco Right 03/02/2014    Procedure: CATARACT EXTRACTION PHACO AND INTRAOCULAR LENS PLACEMENT RIGHT EYE CDE=16.81;  Surgeon: Tonny Branch, MD;  [facility]: [facility];  Service: Ophthalmology;  Laterality: Right;   Family History  Problem Relation Age of Onset  . Heart disease Mother   . Cancer Mother   . Diabetes Father   . Anesthesia problems Neg Hx   . Hypotension Neg Hx   . Malignant hyperthermia Neg Hx   . Pseudochol deficiency Neg Hx   . Arthritis     Social History  Substance Use Topics  . Smoking status: Former Smoker -- 0.25 packs/day for 1 years    Types: Cigarettes, Cigars    Quit date: 05/17/2001  . Smokeless tobacco: Never Used  . Alcohol Use: No    Review of Systems  Constitutional: Negative for appetite change and fatigue.  HENT: Positive for congestion and nosebleeds. Negative for ear discharge and sinus pressure.   Eyes: Negative for discharge.  Respiratory: Positive for cough.   Cardiovascular: Positive for chest pain.  Gastrointestinal: Positive for abdominal pain. Negative for diarrhea.  Genitourinary: Negative for frequency and hematuria.  Musculoskeletal: Negative for back pain.  Skin: Negative for rash.  Neurological: Positive for headaches. Negative for seizures.  Psychiatric/Behavioral: Negative for hallucinations.   Allergies  Review of patient's allergies indicates no known allergies.  Home Medications   Prior to Admission medications   Medication Sig Start Date End Date  Taking? Authorizing Provider  acyclovir (ZOVIRAX) 400 MG tablet TAKE ONE TABLET BY MOUTH EVERY MORNING. 04/06/15  Yes Manon Hilding Kefalas, PA-C  allopurinol (ZYLOPRIM) 300 MG tablet TAKE ONE TABLET BY MOUTH ONCE DAILY. 05/30/15  Yes Manon Hilding Kefalas, PA-C  atorvastatin (LIPITOR) 80 MG tablet TAKE ONE TABLET BY MOUTH AT BEDTIME. 05/11/15  Yes Herminio Commons, MD  calcitRIOL (ROCALTROL) 0.25 MCG capsule Take 0.25 mcg by mouth 3 (three) times a week. Monday, Wednesday, Friday. 12/14/13  Yes Historical Provider, MD  calcium carbonate (OS-CAL) 600 MG TABS tablet Take 1,800 mg by mouth 2 (two) times daily.    Yes Historical Provider, MD  dexamethasone (DECADRON) 4 MG tablet TAKE 5 TABLETS BY MOUTH 2 TIMES A DAY EVERY FRIDAY. 05/30/15  Yes Baird Cancer, PA-C  dexlansoprazole (DEXILANT) 60 MG capsule Take 1 capsule (60 mg total) by mouth daily. 02/25/15  Yes Ripley Fraise, MD  dicyclomine (BENTYL) 20 MG tablet Take 1 tablet (  20 mg total) by mouth every 6 (six) hours. 04/23/15  Yes Rogene Houston, MD  diphenoxylate-atropine (LOMOTIL) 2.5-0.025 MG tablet Take 2 tablets by mouth 2 (two) times daily. 04/23/15  Yes Rogene Houston, MD  HYDROcodone-acetaminophen (NORCO/VICODIN) 5-325 MG per tablet Take 1-2 tablets by mouth every 4 (four) hours as needed for moderate pain or severe pain. 12/01/14  Yes Manon Hilding Kefalas, PA-C  insulin detemir (LEVEMIR) 100 UNIT/ML injection Inject 40 Units into the skin every morning.  09/08/12  Yes Kathie Dike, MD  insulin lispro (HUMALOG) 100 UNIT/ML injection Inject 5-30 Units into the skin 2 (two) times daily with a meal. Sliding Scale per patient   Yes Historical Provider, MD  lenalidomide ([name]) 5 MG capsule Take 1 capsule (5 mg total) by mouth daily. Take 1 tablet PO 7 days on and 7 days off. 06/11/15  Yes Baird Cancer, PA-C  lipase/protease/amylase (CREON) 36000 UNITS CPEP capsule 5 capsules with each meal and 2 with each snack Patient taking differently: 4 capsules  with each meal and 2 with each snack 01/16/15  Yes Rogene Houston, MD  lisinopril (PRINIVIL,ZESTRIL) 5 MG tablet Take 5 mg by mouth every other day. 12/14/13  Yes Historical Provider, MD  loratadine (CLARITIN) 10 MG tablet Take 10 mg by mouth every morning.    Yes Historical Provider, MD  metoprolol succinate (TOPROL-XL) 25 MG 24 hr tablet TAKE ONE TABLET BY MOUTH ONCE DAILY. 12/01/14  Yes Evans Lance, MD  Multiple Vitamins-Minerals (MULTIVITAMINS THER. W/MINERALS) TABS Take 1 tablet by mouth daily.    Yes Historical Provider, MD  nitroGLYCERIN (NITROSTAT) 0.4 MG SL tablet Place 1 tablet (0.4 mg total) under the tongue every 5 (five) minutes as needed for chest pain. 03/16/15  Yes Patrici Ranks, MD  potassium chloride SA (K-DUR,KLOR-CON) 20 MEQ tablet TAKE 2 TABLETS BY MOUTH TWICE DAILY. 06/05/15  Yes Manon Hilding Kefalas, PA-C  sodium bicarbonate 650 MG tablet Take 1,300 mg by mouth 3 (three) times daily.   Yes Historical Provider, MD  sulfamethoxazole-trimethoprim (BACTRIM DS,SEPTRA DS) 800-160 MG per tablet TAKE ONE TABLET BY MOUTH EVERY MONDAY, [place], [place]. 01/30/15  Yes Baird Cancer, PA-C  torsemide (DEMADEX) 20 MG tablet TAKE 2 TABLETS BY MOUTH EACH MORNING AND 1 TABLET EACH EVENING. 12/22/14  Yes Butch Penny, NP  Vitamin D, Ergocalciferol, (DRISDOL) 50000 units CAPS capsule Take 1 capsule (50,000 Units total) by mouth every 30 (thirty) days. 06/06/15  Yes [name] S Kefalas, PA-C  XARELTO 20 MG TABS tablet TAKE 1 TABLET BY MOUTH DAILY. 05/11/15  Yes Herminio Commons, MD   Triage Vitals: BP 117/73 mmHg  Pulse 73  Temp(Src) 98.8 F (37.1 C) (Oral)  Resp 20  SpO2 99% Physical Exam  Constitutional: He is oriented to person, place, and time. He appears well-developed.  HENT:  Head: Normocephalic.  Eyes: Conjunctivae and EOM are normal. No scleral icterus.  Neck: Neck supple. No thyromegaly present.  Cardiovascular: Normal rate and regular rhythm.  Exam reveals no gallop and no  friction rub.   No murmur heard. Pulmonary/Chest: No stridor. He has wheezes (bilateral). He has no rales. He exhibits no tenderness.  Abdominal: He exhibits no distension. There is no tenderness. There is no rebound.  Musculoskeletal: Normal range of motion. He exhibits no edema.  Lymphadenopathy:    He has no cervical adenopathy.  Neurological: He is oriented to person, place, and time. He exhibits normal muscle tone. Coordination normal.  Skin: No rash  noted. No erythema.  Psychiatric: He has a normal mood and affect. His behavior is normal.    ED Course  Procedures (including critical care time) DIAGNOSTIC STUDIES: Oxygen Saturation is 99% on ra, nl by my interpretation.    COORDINATION OF CARE: 9:19 AM: Discussed treatment plan which includes meds and chest xray with pt at bedside; patient verbalizes understanding and agrees with treatment plan.  Imaging Review No results found. I have personally reviewed and evaluated these images as part of my medical decision-making.  MDM   Final diagnoses:  None    Bronchitis we'll treat with Z-Pak and albuterol inhaler  The chart was scribed for me under my direct supervision.  I personally performed the history, physical, and medical decision making and all procedures in the evaluation of this patient.Milton Ferguson, MD 06/25/15 [phone]

## 2015-06-25 NOTE — Discharge Instructions (Signed)
Follow up with your md this week. °

## 2015-07-09 ENCOUNTER — Other Ambulatory Visit: Payer: Self-pay | Admitting: Internal Medicine

## 2015-07-17 ENCOUNTER — Other Ambulatory Visit (HOSPITAL_COMMUNITY): Payer: Self-pay | Admitting: Oncology

## 2015-07-17 DIAGNOSIS — C9001 Multiple myeloma in remission: Secondary | ICD-10-CM

## 2015-07-17 MED ORDER — LENALIDOMIDE 5 MG PO CAPS: 5.0000 mg | ORAL_CAPSULE | Freq: Every day | ORAL | Status: DC

## 2015-07-25 ENCOUNTER — Encounter (HOSPITAL_COMMUNITY): Payer: Managed Care, Other (non HMO) | Attending: Oncology | Admitting: Hematology & Oncology

## 2015-07-25 ENCOUNTER — Encounter (HOSPITAL_BASED_OUTPATIENT_CLINIC_OR_DEPARTMENT_OTHER): Payer: Managed Care, Other (non HMO)

## 2015-07-25 ENCOUNTER — Encounter (HOSPITAL_COMMUNITY): Payer: Self-pay | Admitting: Hematology & Oncology

## 2015-07-25 ENCOUNTER — Encounter (HOSPITAL_COMMUNITY): Payer: Managed Care, Other (non HMO)

## 2015-07-25 VITALS — BP 108/69 | HR 55 | Temp 98.5°F | Resp 18 | Wt 357.0 lb

## 2015-07-25 DIAGNOSIS — N183 Chronic kidney disease, stage 3 (moderate): Secondary | ICD-10-CM

## 2015-07-25 DIAGNOSIS — D649 Anemia, unspecified: Secondary | ICD-10-CM

## 2015-07-25 DIAGNOSIS — C9000 Multiple myeloma not having achieved remission: Secondary | ICD-10-CM | POA: Insufficient documentation

## 2015-07-25 DIAGNOSIS — C9001 Multiple myeloma in remission: Secondary | ICD-10-CM

## 2015-07-25 LAB — CBC WITH DIFFERENTIAL/PLATELET
Basophils Absolute: 0 10*3/uL (ref 0.0–0.1)
Basophils Relative: 0 %
Eosinophils Absolute: 0.3 10*3/uL (ref 0.0–0.7)
Eosinophils Relative: 3 %
HCT: 33.3 % — ABNORMAL LOW (ref 39.0–52.0)
Hemoglobin: 10.8 g/dL — ABNORMAL LOW (ref 13.0–17.0)
Lymphocytes Relative: 34 %
Lymphs Abs: 2.8 10*3/uL (ref 0.7–4.0)
MCH: 30.7 pg (ref 26.0–34.0)
MCHC: 32.4 g/dL (ref 30.0–36.0)
MCV: 94.6 fL (ref 78.0–100.0)
Monocytes Absolute: 0.6 10*3/uL (ref 0.1–1.0)
Monocytes Relative: 8 %
Neutro Abs: 4.5 10*3/uL (ref 1.7–7.7)
Neutrophils Relative %: 55 %
Platelets: 155 10*3/uL (ref 150–400)
RBC: 3.52 MIL/uL — ABNORMAL LOW (ref 4.22–5.81)
RDW: 15.2 % (ref 11.5–15.5)
WBC: 8.2 10*3/uL (ref 4.0–10.5)

## 2015-07-25 LAB — COMPREHENSIVE METABOLIC PANEL
ALT: 43 U/L (ref 17–63)
AST: 41 U/L (ref 15–41)
Albumin: 3.2 g/dL — ABNORMAL LOW (ref 3.5–5.0)
Alkaline Phosphatase: 89 U/L (ref 38–126)
Anion gap: 7 (ref 5–15)
BUN: 26 mg/dL — ABNORMAL HIGH (ref 6–20)
CO2: 25 mmol/L (ref 22–32)
Calcium: 8.5 mg/dL — ABNORMAL LOW (ref 8.9–10.3)
Chloride: 110 mmol/L (ref 101–111)
Creatinine, Ser: 1.95 mg/dL — ABNORMAL HIGH (ref 0.61–1.24)
GFR calc Af Amer: 43 mL/min — ABNORMAL LOW (ref >60)
GFR calc non Af Amer: 37 mL/min — ABNORMAL LOW (ref >60)
Glucose, Bld: 172 mg/dL — ABNORMAL HIGH (ref 65–99)
Potassium: 3.9 mmol/L (ref 3.5–5.1)
Sodium: 142 mmol/L (ref 135–145)
Total Bilirubin: 0.1 mg/dL — ABNORMAL LOW (ref 0.3–1.2)
Total Protein: 5.5 g/dL — ABNORMAL LOW (ref 6.5–8.1)

## 2015-07-25 MED ORDER — SODIUM CHLORIDE 0.9% FLUSH
10.0000 mL | INTRAVENOUS | Status: DC | PRN
  Administered 2015-07-25: 10 mL via INTRAVENOUS
  Filled 2015-07-25: qty 10

## 2015-07-25 MED ORDER — HEPARIN SOD (PORK) LOCK FLUSH 100 UNIT/ML IV SOLN
500.0000 [IU] | Freq: Once | INTRAVENOUS | Status: AC
  Administered 2015-07-25: 500 [IU] via INTRAVENOUS
  Filled 2015-07-25: qty 5

## 2015-07-25 NOTE — Progress Notes (Signed)
Franklin Font, MD [address] [place] [address]   DIAGNOSIS:  Myeloma, kappa light chain, Stage II disease (no evidence of monoclonal protein in serum) Evaluation at [facility] for transplant, felt not to be a good transplant candidate Stage III CKD 3 g proteinuria at diagnosis, Bence-Jones proteinuria BMBX at diagnosis with 19% plasma cells, kappa restricted  BMBX 09/28/2014 showing variably cellular marrow with trilineage hematopoiesis and 2% plasma cells. IHC confirms the presence of a minor plasma cell component which appears to show polyclonal staining pattern per And lambda light chain. Latter appearance is nonspecific and not diagnostic of a plasma cell neoplasm. Background shows trilineage hematopoiesis with nonspecific changes.  CT maxillofacial without contrast 12/05/2014 with mild by asymmetric sclerosis in the R mandibular ramus and adjacent body compared tot the left, without sequestrum or fracture. Could be a manifestation of very early or mild AVN.  CURRENT THERAPY: Lenalidomide 5 mg 7 days on and 7 days off with 20 mg Dexamethasone BID every Friday only.  Last zometa 09/07/2014  INTERVAL HISTORY: [name] 55 y.o. male returns for follow-up of myeloma.  [name] is here alone. I personally reviewed and went over laboratory studies at length with the patient. His myeloma labs are not back yet but we will call with these results.  He is on antibiotic, Flagyl, with one or two days left. He was put on it by his PCP, Dr. [name], for a rectal "cyst." Noted the cyst started draining on Sunday and he has been putting hot compresses on it. He states this has been an intermittent problem.  He has been experiencing intermittent cramps in his hands, though it is tolerable with tylenol. He has been squeezing a bar of soap while having hand cramps to resolve them, and also running his hands under hot water. Denies tingling in the hands. Noting that over time the cramping  will resolve on its own.   Reports intermittent mid-abdominal pain at his pancreas. He follows with Dr. Laural Golden for this.   He does not need any refills at this time. He continues to take revlimid regularly, however he had an issue filling it this last time and missed two days taking it last week.   Reports recent fatigue that is manageable. Denies any unneccessary aches and pains. He continues to work without issue. Denies chest pain, breathing problems, or fevers.    MEDICAL HISTORY: Past Medical History  Diagnosis Date  . Arteriosclerotic cardiovascular disease (ASCVD)     MI-2000s; stent to the proximal LAD and diagonal in 2001; stress nuclear in 2008-impaired exercise capacity, left ventricular dilatation, moderately to severely depressed EF, apical, inferior and anteroseptal scar  . Hypertension   . 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 Diabetes mellitus     Insulin  . GERD (gastroesophageal reflux disease)   . Pedal edema     Venous insufficiency  . Obesity   . Hyperlipidemia   . Injection site reaction   . Cellulitis of leg     both legs  . Chronic kidney disease, stage 3, mod decreased GFR     Creatinine of 1.84 in 06/2011 and 1.5 in 07/2011  . Chronic diarrhea   . Sleep apnea     uses cpap  . Ulcer   . Atrial flutter ([place])   . Multiple myeloma 07/01/2011  . Myocardial infarction ([place]) 2000  . Gout     has DIABETES MELLITUS, TYPE II, ON INSULIN; Morbid obesity ([place]);  SLEEP APNEA; Multiple myeloma ([place]); Arteriosclerotic cardiovascular disease (ASCVD); Hypertension; Hyperlipidemia; DDD (degenerative disc disease), cervical; Atrial flutter ([place]); Chronic pancreatitis ([place]); Anemia, normocytic normochromic; and Chronic systolic heart failure ([place]) on his problem list.     has No Known Allergies.  [name] had no medications administered during this visit.  SURGICAL HISTORY: Past Surgical History  Procedure Laterality Date  . Laparoscopic gastric banding  2006     has been removed  . Wrist surgery      Left; removal of bone fragment  . Incision and drainage abscess anal    . Abscess drainage      Scrotal  . Bone marrow biopsy  05/13/11  . Portacath placement  07/07/2011    Procedure: INSERTION PORT-A-CATH;  Surgeon: Scherry Ran, MD;  [facility];  Service: General;  Laterality: N/A;  . Colonoscopy  11/28/2011    Procedure: COLONOSCOPY;  Surgeon: Rogene Houston, MD;  [facility];  Service: Endoscopy;  Laterality: N/A;  930  . Esophagogastroduodenoscopy  01/02/2012    Procedure: ESOPHAGOGASTRODUODENOSCOPY (EGD);  Surgeon: Rogene Houston, MD;  [facility];  Service: Endoscopy;  Laterality: N/A;  100  . Biopsy  01/02/2012    Procedure: BIOPSY;  Surgeon: Rogene Houston, MD;  [facility];  Service: Endoscopy;  Laterality: N/A;  . Esophagogastroduodenoscopy N/A 09/20/2012    Procedure: ESOPHAGOGASTRODUODENOSCOPY (EGD);  Surgeon: Rogene Houston, MD;  [facility];  Service: Endoscopy;  Laterality: N/A;  . Eus N/A 10/07/2012    Procedure: UPPER ENDOSCOPIC ULTRASOUND (EUS) LINEAR;  Surgeon: Milus Banister, MD;  [facility];  Service: Endoscopy;  Laterality: N/A;  . Cardioversion N/A 10/13/2012    Procedure: CARDIOVERSION;  Surgeon: Yehuda Savannah, MD;  Location[facility];  Service: Cardiovascular;  Laterality: N/A;  . Cardiac catheterization      cardiac stent  . Port-a-cath removal Left 12/07/2012    Procedure: REMOVAL PORT-A-CATH;  Surgeon: Scherry Ran, MD;  [facility]: [facility];  Service: General;  Laterality: Left;  . Portacath placement N/A 12/07/2012    Procedure: INSERTION PORT-A-CATH;  Surgeon: Scherry Ran, MD;  Location[facility];  Service: General;  Laterality: N/A;  Attempted portacath placement on left and right side  . Cataract extraction w/phaco Left 02/13/2014    Procedure: CATARACT EXTRACTION PHACO AND INTRAOCULAR LENS PLACEMENT (IOC);  Surgeon: Tonny Branch, MD;  [facility];  Service: Ophthalmology;  Laterality: Left;  CDE:  7.67  . Cataract extraction w/phaco Right 03/02/2014    Procedure: CATARACT EXTRACTION PHACO AND INTRAOCULAR LENS PLACEMENT RIGHT EYE CDE=16.81;  Surgeon: Tonny Branch, MD;  Location: [facility];  Service: Ophthalmology;  Laterality: Right;    SOCIAL HISTORY: Social History   Social History  . Marital Status: Married    Spouse Name: N/A  . Number of Children: N/A  . Years of Education: 16   Occupational History  . Nurse, learning disability   . Funeral Director    Social History Main Topics  . Smoking status: Former Smoker -- 0.25 packs/day for 1 years    Types: Cigarettes, Cigars    Quit date: 05/17/2001  . Smokeless tobacco: Never Used  . Alcohol Use: No  . Drug Use: No  . Sexual Activity: Yes    Birth Control/ Protection: None   Other Topics Concern  . Not on file   Social History Narrative    FAMILY HISTORY: Family History  Problem Relation Age of Onset  .  Heart disease Mother   . Cancer Mother   . Diabetes Father   . Anesthesia problems Neg Hx   . Hypotension Neg Hx   . Malignant hyperthermia Neg Hx   . Pseudochol deficiency Neg Hx   . Arthritis      Review of Systems  Constitutional: Positive for malaise/fatigue.   HENT: Negative.   Eyes: Negative.   Respiratory: Negative. Cardiovascular: Negative.   Gastrointestinal: Negative.  Genitourinary: Negative.   Musculoskeletal: Positive for cramping. Intermittent cramping of the hands. Skin: Negative.   Neurological: Negative.  Psychiatric/Behavioral: Negative.    14 point review of systems was performed and is negative except as detailed under history of present illness and above  PHYSICAL EXAMINATION  ECOG PERFORMANCE STATUS: 1 - Symptomatic but completely ambulatory  Filed Vitals:   07/25/15 1409  BP: 108/69  Pulse: 55  Temp: 98.5 F (36.9 C)  Resp: 18    Physical Exam  Constitutional: He is oriented to person, place, and time and well-developed,  well-nourished, and in no distress. Obese HENT:  Head: Normocephalic and atraumatic.  Nose: Nose normal.  Mouth/Throat: Oropharynx is clear and moist. No oropharyngeal exudate.  Eyes: Conjunctivae and EOM are normal. Pupils are equal, round, and reactive to light. Right eye exhibits no discharge. Left eye exhibits no discharge. No scleral icterus.  Neck: Normal range of motion. Neck supple. No tracheal deviation present. No thyromegaly present.  Cardiovascular: Normal rate, regular rhythm and normal heart sounds.  Exam reveals no gallop and no friction rub.   No murmur heard. Pulmonary/Chest: Effort normal and breath sounds normal. He has no wheezes. He has no rales.  Abdominal: Soft. Bowel sounds are normal. He exhibits no distension and no mass. There is no tenderness. There is no rebound and no guarding.  Musculoskeletal: Normal range of motion. He exhibit edema.  Lymphadenopathy:    He has no cervical adenopathy.  Neurological: He is alert and oriented to person, place, and time. He has normal reflexes. No cranial nerve deficit. Gait normal. Coordination normal.  Skin: Skin is warm and dry. No rash noted.  Psychiatric: Mood, memory, affect and judgment normal.  Nursing note and vitals reviewed.   LABORATORY DATA: I have reviewed the data as listed CBC    Component Value Date/Time   WBC 8.2 07/25/2015 1335   RBC 3.52* 07/25/2015 1335   HGB 10.8* 07/25/2015 1335   HCT 33.3* 07/25/2015 1335   PLT 155 07/25/2015 1335   MCV 94.6 07/25/2015 1335   MCH 30.7 07/25/2015 1335   MCHC 32.4 07/25/2015 1335   RDW 15.2 07/25/2015 1335   LYMPHSABS 2.8 07/25/2015 1335   MONOABS 0.6 07/25/2015 1335   EOSABS 0.3 07/25/2015 1335   BASOSABS 0.0 07/25/2015 1335   CMP     Component Value Date/Time   NA 142 07/25/2015 1335   K 3.9 07/25/2015 1335   CL 110 07/25/2015 1335   CO2 25 07/25/2015 1335   GLUCOSE 172* 07/25/2015 1335   BUN 26* 07/25/2015 1335   CREATININE 1.95* 07/25/2015 1335    CREATININE 2.24* 10/05/2012 1411   CALCIUM 8.5* 07/25/2015 1335   CALCIUM 8.8 03/08/2015 1410   PROT 5.5* 07/25/2015 1335   ALBUMIN 3.2* 07/25/2015 1335   AST 41 07/25/2015 1335   ALT 43 07/25/2015 1335   ALKPHOS 89 07/25/2015 1335   BILITOT 0.1* 07/25/2015 1335   GFRNONAA 37* 07/25/2015 1335   GFRAA 43* 07/25/2015 1335    ASSESSMENT and THERAPY PLAN:   Multiple myeloma,  light chain disease Evaluation at [facility] for transplant, felt not to be a good transplant candidate Stage III CKD Diabetes  Kappa/lambda light chain ratio remains WNL.  Other available labs are reviewed with the patient in detail.  He is realistically doing well. He would like to continue on bimonthly follow-up.  He still desires monthly labs.   He will continue on revlimid.   All questions were answered. The patient knows to call the clinic with any problems, questions or concerns. We can certainly see the patient much sooner if necessary.   This note was electronically signed.  This document serves as a record of services personally performed by Ancil Linsey, MD. It was created on her behalf by Arlyce Harman, a trained medical scribe. The creation of this record is based on the scribe's personal observations and the provider's statements to them. This document has been checked and approved by the attending provider.  I have reviewed the above documentation for accuracy and completeness and I agree with the above.  Patrici Ranks, MD

## 2015-07-25 NOTE — Patient Instructions (Addendum)
[place] at [facility] Discharge Instructions  RECOMMENDATIONS MADE BY THE CONSULTANT AND ANY TEST RESULTS WILL BE SENT TO YOUR REFERRING PHYSICIAN.   Exam and discussion by Dr Whitney Muse today Lab work is good, myeloma panel is still pending, we will call you with those results Port flush today Port flush every 8 weeks Labs every month Return to see the doctor in 2 months Please call the clinic if you have any questions or concerns    Thank you for choosing [place] at [facility] to provide your oncology and hematology care.  To afford each patient quality time with our provider, please arrive at least 15 minutes before your scheduled appointment time.   Beginning January 23rd 2017 lab work for the [facility] will be done in the  Main lab at [facility] on 1st floor. If you have a lab appointment with the [facility] please come in thru the  Main Entrance and check in at the main information desk  You need to re-schedule your appointment should you arrive 10 or more minutes late.  We strive to give you quality time with our providers, and arriving late affects you and other patients whose appointments are after yours.  Also, if you no show three or more times for appointments you may be dismissed from the clinic at the providers discretion.     Again, thank you for choosing [facility].  Our hope is that these requests will decrease the amount of time that you wait before being seen by our physicians.       _____________________________________________________________  Should you have questions after your visit to [facility], please contact our office at [phone] between the hours of 8:30 a.m. and 4:30 p.m.  Voicemails left after 4:30 p.m. will not be returned until the following business day.  For prescription refill requests, have your pharmacy contact our office.

## 2015-07-25 NOTE — Progress Notes (Signed)
..  Roda Shutters presented for Portacath access and flush.   Portacath located rt chest wall accessed with  H 20 needle.  Good blood return present. Portacath flushed with 61ml NS and 500U/50ml Heparin and needle removed intact.  Procedure tolerated well and without incident.

## 2015-07-26 LAB — PROTEIN ELECTROPHORESIS, SERUM
A/G Ratio: 1.4 (ref 0.7–1.7)
Albumin ELP: 2.9 g/dL (ref 2.9–4.4)
Alpha-1-Globulin: 0.2 g/dL (ref 0.0–0.4)
Alpha-2-Globulin: 0.7 g/dL (ref 0.4–1.0)
Beta Globulin: 0.8 g/dL (ref 0.7–1.3)
Gamma Globulin: 0.3 g/dL — ABNORMAL LOW (ref 0.4–1.8)
Globulin, Total: 2 g/dL — ABNORMAL LOW (ref 2.2–3.9)
Total Protein ELP: 4.9 g/dL — ABNORMAL LOW (ref 6.0–8.5)

## 2015-07-26 LAB — KAPPA/LAMBDA LIGHT CHAINS
Kappa free light chain: 26.41 mg/L — ABNORMAL HIGH (ref 3.30–19.40)
Kappa, lambda light chain ratio: 1.34 (ref 0.26–1.65)
Lambda free light chains: 19.73 mg/L (ref 5.71–26.30)

## 2015-07-26 LAB — IMMUNOFIXATION ELECTROPHORESIS
IgA: 63 mg/dL — ABNORMAL LOW (ref 90–386)
IgG (Immunoglobin G), Serum: 344 mg/dL — ABNORMAL LOW (ref 700–1600)
IgM, Serum: 44 mg/dL (ref 20–172)
Total Protein ELP: 4.9 g/dL — ABNORMAL LOW (ref 6.0–8.5)

## 2015-07-26 LAB — IGG, IGA, IGM
IgA: 67 mg/dL — ABNORMAL LOW (ref 90–386)
IgG (Immunoglobin G), Serum: 343 mg/dL — ABNORMAL LOW (ref 700–1600)
IgM, Serum: 42 mg/dL (ref 20–172)

## 2015-08-13 ENCOUNTER — Other Ambulatory Visit (HOSPITAL_COMMUNITY): Payer: Self-pay | Admitting: *Deleted

## 2015-08-13 DIAGNOSIS — C9001 Multiple myeloma in remission: Secondary | ICD-10-CM

## 2015-08-13 MED ORDER — LENALIDOMIDE 5 MG PO CAPS: 5.0000 mg | ORAL_CAPSULE | Freq: Every day | ORAL | Status: DC

## 2015-08-20 ENCOUNTER — Other Ambulatory Visit (HOSPITAL_COMMUNITY): Payer: Self-pay | Admitting: Oncology

## 2015-08-21 ENCOUNTER — Encounter (INDEPENDENT_AMBULATORY_CARE_PROVIDER_SITE_OTHER): Payer: 59 | Admitting: Internal Medicine

## 2015-08-22 ENCOUNTER — Encounter (HOSPITAL_COMMUNITY): Payer: Managed Care, Other (non HMO) | Attending: Oncology

## 2015-08-22 ENCOUNTER — Telehealth (INDEPENDENT_AMBULATORY_CARE_PROVIDER_SITE_OTHER): Payer: Self-pay | Admitting: Internal Medicine

## 2015-08-22 DIAGNOSIS — C9000 Multiple myeloma not having achieved remission: Secondary | ICD-10-CM | POA: Insufficient documentation

## 2015-08-22 DIAGNOSIS — C9001 Multiple myeloma in remission: Secondary | ICD-10-CM

## 2015-08-22 LAB — COMPREHENSIVE METABOLIC PANEL
ALT: 97 U/L — ABNORMAL HIGH (ref 17–63)
AST: 52 U/L — ABNORMAL HIGH (ref 15–41)
Albumin: 3.3 g/dL — ABNORMAL LOW (ref 3.5–5.0)
Alkaline Phosphatase: 94 U/L (ref 38–126)
Anion gap: 5 (ref 5–15)
BUN: 34 mg/dL — ABNORMAL HIGH (ref 6–20)
CO2: 29 mmol/L (ref 22–32)
Calcium: 8.4 mg/dL — ABNORMAL LOW (ref 8.9–10.3)
Chloride: 105 mmol/L (ref 101–111)
Creatinine, Ser: 1.57 mg/dL — ABNORMAL HIGH (ref 0.61–1.24)
GFR calc Af Amer: 55 mL/min — ABNORMAL LOW (ref >60)
GFR calc non Af Amer: 48 mL/min — ABNORMAL LOW (ref >60)
Glucose, Bld: 133 mg/dL — ABNORMAL HIGH (ref 65–99)
Potassium: 3.8 mmol/L (ref 3.5–5.1)
Sodium: 139 mmol/L (ref 135–145)
Total Bilirubin: 0.8 mg/dL (ref 0.3–1.2)
Total Protein: 5.9 g/dL — ABNORMAL LOW (ref 6.5–8.1)

## 2015-08-22 LAB — CBC WITH DIFFERENTIAL/PLATELET
Basophils Absolute: 0 10*3/uL (ref 0.0–0.1)
Basophils Relative: 0 %
Eosinophils Absolute: 0.4 10*3/uL (ref 0.0–0.7)
Eosinophils Relative: 5 %
HCT: 33.3 % — ABNORMAL LOW (ref 39.0–52.0)
Hemoglobin: 11 g/dL — ABNORMAL LOW (ref 13.0–17.0)
Lymphocytes Relative: 16 %
Lymphs Abs: 1.3 10*3/uL (ref 0.7–4.0)
MCH: 31.3 pg (ref 26.0–34.0)
MCHC: 33 g/dL (ref 30.0–36.0)
MCV: 94.6 fL (ref 78.0–100.0)
Monocytes Absolute: 0.6 10*3/uL (ref 0.1–1.0)
Monocytes Relative: 7 %
Neutro Abs: 6.1 10*3/uL (ref 1.7–7.7)
Neutrophils Relative %: 72 %
Platelets: 139 10*3/uL — ABNORMAL LOW (ref 150–400)
RBC: 3.52 MIL/uL — ABNORMAL LOW (ref 4.22–5.81)
RDW: 15.4 % (ref 11.5–15.5)
WBC: 8.5 10*3/uL (ref 4.0–10.5)

## 2015-08-22 NOTE — Telephone Encounter (Signed)
Patient will be called ,once we check and see if we have samples. Per Dr.Rehman we may give the patient samples.

## 2015-08-22 NOTE — Telephone Encounter (Signed)
[name] left a message saying he needs Creon(sp?) samples. Please give him a call if this is possible.  Pt's ph# [phone] Thank you.

## 2015-08-23 ENCOUNTER — Encounter (INDEPENDENT_AMBULATORY_CARE_PROVIDER_SITE_OTHER): Payer: Self-pay | Admitting: Internal Medicine

## 2015-08-23 LAB — KAPPA/LAMBDA LIGHT CHAINS
Kappa free light chain: 23.19 mg/L — ABNORMAL HIGH (ref 3.30–19.40)
Kappa, lambda light chain ratio: 1.25 (ref 0.26–1.65)
Lambda free light chains: 18.6 mg/L (ref 5.71–26.30)

## 2015-08-23 LAB — IGG, IGA, IGM
IgA: 60 mg/dL — ABNORMAL LOW (ref 90–386)
IgG (Immunoglobin G), Serum: 326 mg/dL — ABNORMAL LOW (ref 700–1600)
IgM, Serum: 41 mg/dL (ref 20–172)

## 2015-08-23 LAB — PROTEIN ELECTROPHORESIS, SERUM
A/G Ratio: 1.3 (ref 0.7–1.7)
Albumin ELP: 2.8 g/dL — ABNORMAL LOW (ref 2.9–4.4)
Alpha-1-Globulin: 0.2 g/dL (ref 0.0–0.4)
Alpha-2-Globulin: 0.7 g/dL (ref 0.4–1.0)
Beta Globulin: 0.9 g/dL (ref 0.7–1.3)
Gamma Globulin: 0.3 g/dL — ABNORMAL LOW (ref 0.4–1.8)
Globulin, Total: 2.1 g/dL — ABNORMAL LOW (ref 2.2–3.9)
Total Protein ELP: 4.9 g/dL — ABNORMAL LOW (ref 6.0–8.5)

## 2015-08-24 NOTE — Progress Notes (Signed)
This encounter was created in error - please disregard.

## 2015-08-29 LAB — IMMUNOFIXATION ELECTROPHORESIS

## 2015-09-10 ENCOUNTER — Other Ambulatory Visit (HOSPITAL_COMMUNITY): Payer: Self-pay | Admitting: Oncology

## 2015-09-10 ENCOUNTER — Telehealth (HOSPITAL_COMMUNITY): Payer: Self-pay | Admitting: *Deleted

## 2015-09-10 DIAGNOSIS — C9001 Multiple myeloma in remission: Secondary | ICD-10-CM

## 2015-09-10 MED ORDER — LENALIDOMIDE 5 MG PO CAPS: 5.0000 mg | ORAL_CAPSULE | Freq: Every day | ORAL | Status: DC

## 2015-09-11 ENCOUNTER — Other Ambulatory Visit (HOSPITAL_COMMUNITY): Payer: Self-pay | Admitting: Oncology

## 2015-09-19 ENCOUNTER — Encounter (HOSPITAL_BASED_OUTPATIENT_CLINIC_OR_DEPARTMENT_OTHER): Payer: Managed Care, Other (non HMO) | Admitting: Hematology & Oncology

## 2015-09-19 ENCOUNTER — Encounter (HOSPITAL_COMMUNITY): Payer: Self-pay | Admitting: Hematology & Oncology

## 2015-09-19 ENCOUNTER — Encounter (HOSPITAL_COMMUNITY): Payer: Managed Care, Other (non HMO) | Attending: Oncology

## 2015-09-19 VITALS — BP 128/67 | HR 48 | Temp 98.0°F | Resp 18 | Wt 359.0 lb

## 2015-09-19 DIAGNOSIS — C9000 Multiple myeloma not having achieved remission: Secondary | ICD-10-CM | POA: Insufficient documentation

## 2015-09-19 DIAGNOSIS — C9001 Multiple myeloma in remission: Secondary | ICD-10-CM

## 2015-09-19 DIAGNOSIS — Z95828 Presence of other vascular implants and grafts: Secondary | ICD-10-CM

## 2015-09-19 LAB — CBC WITH DIFFERENTIAL/PLATELET
Basophils Absolute: 0 10*3/uL (ref 0.0–0.1)
Basophils Relative: 0 %
Eosinophils Absolute: 0.3 10*3/uL (ref 0.0–0.7)
Eosinophils Relative: 5 %
HCT: 30.2 % — ABNORMAL LOW (ref 39.0–52.0)
Hemoglobin: 10.2 g/dL — ABNORMAL LOW (ref 13.0–17.0)
Lymphocytes Relative: 34 %
Lymphs Abs: 2.1 10*3/uL (ref 0.7–4.0)
MCH: 31.8 pg (ref 26.0–34.0)
MCHC: 33.8 g/dL (ref 30.0–36.0)
MCV: 94.1 fL (ref 78.0–100.0)
Monocytes Absolute: 0.5 10*3/uL (ref 0.1–1.0)
Monocytes Relative: 8 %
Neutro Abs: 3.3 10*3/uL (ref 1.7–7.7)
Neutrophils Relative %: 53 %
Platelets: 131 10*3/uL — ABNORMAL LOW (ref 150–400)
RBC: 3.21 MIL/uL — ABNORMAL LOW (ref 4.22–5.81)
RDW: 16.6 % — ABNORMAL HIGH (ref 11.5–15.5)
WBC: 6.3 10*3/uL (ref 4.0–10.5)

## 2015-09-19 LAB — COMPREHENSIVE METABOLIC PANEL
ALT: 113 U/L — ABNORMAL HIGH (ref 17–63)
AST: 88 U/L — ABNORMAL HIGH (ref 15–41)
Albumin: 3.1 g/dL — ABNORMAL LOW (ref 3.5–5.0)
Alkaline Phosphatase: 89 U/L (ref 38–126)
Anion gap: 5 (ref 5–15)
BUN: 30 mg/dL — ABNORMAL HIGH (ref 6–20)
CO2: 20 mmol/L — ABNORMAL LOW (ref 22–32)
Calcium: 8 mg/dL — ABNORMAL LOW (ref 8.9–10.3)
Chloride: 116 mmol/L — ABNORMAL HIGH (ref 101–111)
Creatinine, Ser: 1.34 mg/dL — ABNORMAL HIGH (ref 0.61–1.24)
GFR calc Af Amer: >60 mL/min (ref >60)
GFR calc non Af Amer: 58 mL/min — ABNORMAL LOW (ref >60)
Glucose, Bld: 160 mg/dL — ABNORMAL HIGH (ref 65–99)
Potassium: 3.6 mmol/L (ref 3.5–5.1)
Sodium: 141 mmol/L (ref 135–145)
Total Bilirubin: 0.6 mg/dL (ref 0.3–1.2)
Total Protein: 5.4 g/dL — ABNORMAL LOW (ref 6.5–8.1)

## 2015-09-19 MED ORDER — HEPARIN SOD (PORK) LOCK FLUSH 100 UNIT/ML IV SOLN
500.0000 [IU] | Freq: Once | INTRAVENOUS | Status: AC
  Administered 2015-09-19: 500 [IU] via INTRAVENOUS

## 2015-09-19 MED ORDER — SODIUM CHLORIDE 0.9% FLUSH
10.0000 mL | INTRAVENOUS | Status: DC | PRN
  Administered 2015-09-19: 10 mL via INTRAVENOUS
  Filled 2015-09-19: qty 10

## 2015-09-19 MED ORDER — HEPARIN SOD (PORK) LOCK FLUSH 100 UNIT/ML IV SOLN
INTRAVENOUS | Status: AC
  Filled 2015-09-19: qty 5

## 2015-09-19 NOTE — Progress Notes (Signed)
Franklin Font, MD [address] [place] [address]   DIAGNOSIS:  Myeloma, kappa light chain, Stage II disease (no evidence of monoclonal protein in serum) Evaluation at [facility] for transplant, felt not to be a good transplant candidate Stage III CKD 3 g proteinuria at diagnosis, Bence-Jones proteinuria BMBX at diagnosis with 19% plasma cells, kappa restricted  BMBX 09/28/2014 showing variably cellular marrow with trilineage hematopoiesis and 2% plasma cells. IHC confirms the presence of a minor plasma cell component which appears to show polyclonal staining pattern per And lambda light chain. Latter appearance is nonspecific and not diagnostic of a plasma cell neoplasm. Background shows trilineage hematopoiesis with nonspecific changes.  CT maxillofacial without contrast 12/05/2014 with mild by asymmetric sclerosis in the R mandibular ramus and adjacent body compared tot the left, without sequestrum or fracture. Could be a manifestation of very early or mild AVN.  CURRENT THERAPY: Lenalidomide 5 mg 7 days on and 7 days off with 20 mg Dexamethasone BID every Friday only.  Last zometa 09/07/2014  INTERVAL HISTORY: [name] 55 y.o. male returns for follow-up of myeloma.  [name] is here alone and doing well.  He is feeling good and enjoying the weather. He has transitioned from a boot on the Left lower extremity to a shoe. He wears knee high compression stockings bilaterally on the lower extremities. His appetite is good. His energy levels rise and fall, "like a tire inflating". Nothing has been bothering him "to an extreme".   Since our last visit, he was treated for bronchitis with antibiotics. This has resolved.  He missed two appointments "with the gentleman" about his teeth. He plans to get back with him. He has a dental appointment with his regular dentist as he has been feeling more hot and cold sensations in his teeth. He intermittently feels a soreness in his  mouth that comes and goes.   His hand cramping is not "quite right". It is not any worse than it used to be.   He does not need any refills at this time. He did have some issues with his medications again, where no one called him with a refill. He has spoken with Angie about this previously and plans to do so again.   MEDICAL HISTORY: Past Medical History  Diagnosis Date  . Arteriosclerotic cardiovascular disease (ASCVD)     MI-2000s; stent to the proximal LAD and diagonal in 2001; stress nuclear in 2008-impaired exercise capacity, left ventricular dilatation, moderately to severely depressed EF, apical, inferior and anteroseptal scar  . Hypertension   . 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 Diabetes mellitus     Insulin  . GERD (gastroesophageal reflux disease)   . Pedal edema     Venous insufficiency  . Obesity   . Hyperlipidemia   . Injection site reaction   . Cellulitis of leg     both legs  . Chronic kidney disease, stage 3, mod decreased GFR     Creatinine of 1.84 in 06/2011 and 1.5 in 07/2011  . Chronic diarrhea   . Sleep apnea     uses cpap  . Ulcer   . Atrial flutter ([place])   . Multiple myeloma 07/01/2011  . Myocardial infarction ([place]) 2000  . Gout     has DIABETES MELLITUS, TYPE II, ON INSULIN; Morbid obesity ([place]); SLEEP APNEA; Multiple myeloma ([place]); Arteriosclerotic cardiovascular disease (ASCVD); Hypertension; Hyperlipidemia; DDD (degenerative disc disease), cervical; Atrial flutter ([place]); Chronic pancreatitis ([place]); Anemia,  normocytic normochromic; and Chronic systolic heart failure ([place]) on his problem list.     has No Known Allergies.  [name] does not currently have medications on file.  SURGICAL HISTORY: Past Surgical History  Procedure Laterality Date  . Laparoscopic gastric banding  2006    has been removed  . Wrist surgery      Left; removal of bone fragment  . Incision and drainage abscess anal    . Abscess drainage      Scrotal  . Bone marrow  biopsy  05/13/11  . Portacath placement  07/07/2011    Procedure: INSERTION PORT-A-CATH;  Surgeon: Scherry Ran, MD;  Location: [facility] ORS;  Service: General;  Laterality: N/A;  . Colonoscopy  11/28/2011    Procedure: COLONOSCOPY;  Surgeon: Rogene Houston, MD;  Location: [facility] ENDO SUITE;  Service: Endoscopy;  Laterality: N/A;  930  . Esophagogastroduodenoscopy  01/02/2012    Procedure: ESOPHAGOGASTRODUODENOSCOPY (EGD);  Surgeon: Rogene Houston, MD;  Location: [facility] ENDO SUITE;  Service: Endoscopy;  Laterality: N/A;  100  . Biopsy  01/02/2012    Procedure: BIOPSY;  Surgeon: Rogene Houston, MD;  Location: [facility] END[facility];  Service: Endoscopy;  Laterality: N/A;  . Esophagogastroduodenoscopy N/A 09/20/2012    Procedure: ESOPHAGOGASTRODUODENOSCOPY (EGD);  Surgeon: Rogene Houston, MD;  Location: [facility];  Service: Endoscopy;  Laterality: N/A;  . Eus N/A 10/07/2012    Procedure: UPPER ENDOSCOPIC ULTRASOUND (EUS) LINEAR;  Surgeon: Milus Banister, MD;  Location: [facility] ENDOSCOPY;  Service: Endoscopy;  Laterality: N/A;  . Cardioversion N/A 10/13/2012    Procedure: CARDIOVERSION;  Surgeon: Yehuda Savannah, MD;  Location: [facility] ORS;  Service: Cardiovascular;  Laterality: N/A;  . Cardiac catheterization      cardiac stent  . Port-a-cath removal Left 12/07/2012    Procedure: REMOVAL PORT-A-CATH;  Surgeon: Scherry Ran, MD;  Location: [facility];  Service: General;  Laterality: Left;  . Portacath placement N/A 12/07/2012    Procedure: INSERTION PORT-A-CATH;  Surgeon: Scherry Ran, MD;  Location: [facility] OR[facility];  Service: General;  Laterality: N/A;  Attempted portacath placement on left and right side  . Cataract extraction w/phaco Left 02/13/2014    Procedure: CATARACT EXTRACTION PHACO AND INTRAOCULAR LENS PLACEMENT (IOC);  Surgeon: Tonny Branch, MD;  Location: [facility];  Service: Ophthalmology;  Laterality: Left;  CDE:  7.67  . Cataract extraction w/phaco Right 03/02/2014    Procedure: CATARACT EXTRACTION PHACO AND  INTRAOCULAR LENS PLACEMENT RIGHT EYE CDE=16.81;  Surgeon: Tonny Branch, MD;  Location: [facility];  Service: Ophthalmology;  Laterality: Right;    SOCIAL HISTORY: Social History   Social History  . Marital Status: Married    Spouse Name: N/A  . Number of Children: N/A  . Years of Education: 16   Occupational History  . Nurse, learning disability   . Funeral Director    Social History Main Topics  . Smoking status: Former Smoker -- 0.25 packs/day for 1 years    Types: Cigarettes, Cigars    Quit date: 05/17/2001  . Smokeless tobacco: Never Used  . Alcohol Use: No  . Drug Use: No  . Sexual Activity: Yes    Birth Control/ Protection: None   Other Topics Concern  . Not on file   Social History Narrative    FAMILY HISTORY: Family History  Problem Relation Age of Onset  . Heart disease Mother   . Cancer Mother   . Diabetes Father   . Anesthesia problems Neg Hx   .  Hypotension Neg Hx   . Malignant hyperthermia Neg Hx   . Pseudochol deficiency Neg Hx   . Arthritis      Review of Systems  Constitutional: Negative. HENT: Negative.   Eyes: Negative.   Respiratory: Negative. Cardiovascular: Negative.   Gastrointestinal: Negative.  Genitourinary: Negative.   Musculoskeletal: Positive for cramping. Intermittent cramping of the hands. Skin: Negative.   Neurological: Negative.  Psychiatric/Behavioral: Negative.    14 point review of systems was performed and is negative except as detailed under history of present illness and above  PHYSICAL EXAMINATION  ECOG PERFORMANCE STATUS: 1 - Symptomatic but completely ambulatory  Filed Vitals:   09/19/15 1500  BP: 128/67  Pulse: 48  Temp: 98 F (36.7 C)  Resp: 18    Physical Exam  Constitutional: He is oriented to person, place, and time and well-developed, well-nourished, and in no distress. Obese HENT:  Head: Normocephalic and atraumatic.  Nose: Nose normal.  Mouth/Throat: Oropharynx is clear and moist. No oropharyngeal exudate.   Eyes: Conjunctivae and EOM are normal. Pupils are equal, round, and reactive to light. Right eye exhibits no discharge. Left eye exhibits no discharge. No scleral icterus.  Neck: Normal range of motion. Neck supple. No tracheal deviation present. No thyromegaly present.  Cardiovascular: Normal rate, regular rhythm and normal heart sounds.  Exam reveals no gallop and no friction rub.   No murmur heard. Pulmonary/Chest: Effort normal and breath sounds normal. He has no wheezes. He has no rales.  Abdominal: Soft. Bowel sounds are normal. He exhibits no distension and no mass. There is no tenderness. There is no rebound and no guarding.  Musculoskeletal: Normal range of motion.  Extremities: Knee high compression stockings on the lower extremities bilaterally. Lymphadenopathy:    He has no cervical adenopathy.  Neurological: He is alert and oriented to person, place, and time. He has normal reflexes. No cranial nerve deficit. Gait normal. Coordination normal.  Skin: Skin is warm and dry. No rash noted.  Psychiatric: Mood, memory, affect and judgment normal.  Nursing note and vitals reviewed.   LABORATORY DATA: I have reviewed the data as listed CBC    Component Value Date/Time   WBC 6.3 09/19/2015 1141   RBC 3.21* 09/19/2015 1141   HGB 10.2* 09/19/2015 1141   HCT 30.2* 09/19/2015 1141   PLT 131* 09/19/2015 1141   MCV 94.1 09/19/2015 1141   MCH 31.8 09/19/2015 1141   MCHC 33.8 09/19/2015 1141   RDW 16.6* 09/19/2015 1141   LYMPHSABS 2.1 09/19/2015 1141   MONOABS 0.5 09/19/2015 1141   EOSABS 0.3 09/19/2015 1141   BASOSABS 0.0 09/19/2015 1141   CMP     Component Value Date/Time   NA 141 09/19/2015 1141   K 3.6 09/19/2015 1141   CL 116* 09/19/2015 1141   CO2 20* 09/19/2015 1141   GLUCOSE 160* 09/19/2015 1141   BUN 30* 09/19/2015 1141   CREATININE 1.34* 09/19/2015 1141   CREATININE 2.24* 10/05/2012 1411   CALCIUM 8.0* 09/19/2015 1141   CALCIUM 8.8 03/08/2015 1410   PROT 5.4*  09/19/2015 1141   ALBUMIN 3.1* 09/19/2015 1141   AST 88* 09/19/2015 1141   ALT 113* 09/19/2015 1141   ALKPHOS 89 09/19/2015 1141   BILITOT 0.6 09/19/2015 1141   GFRNONAA 58* 09/19/2015 1141   GFRAA >60 09/19/2015 1141    ASSESSMENT and THERAPY PLAN:   Multiple myeloma, light chain disease Evaluation at [facility] for transplant, felt not to be a good transplant candidate Stage III CKD Diabetes  Kappa/lambda light chain ratio remains WNL.  Other available labs are reviewed with the patient in detail.  He is realistically doing well. He would like to continue on bimonthly follow-up.  He still desires monthly labs. His mother was a patient at [facility] and died from myeloma; because of this [name] has at times a lot of anxiety about his illness.  He will continue on revlimid.   He does not need any refills at this time. He did have some issues with his medications again, where no one called him with a refill. He has spoken with Angie about this previously and plans to do so again. I will also speak with Angie about this. I have assured him that we sign and fax refills in monthly.   We will call with the remainder of today's blood results.  All questions were answered. The patient knows to call the clinic with any problems, questions or concerns. We can certainly see the patient much sooner if necessary.   This note was electronically signed.  This document serves as a record of services personally performed by Ancil Linsey, MD. It was created on her behalf by Arlyce Harman, a trained medical scribe. The creation of this record is based on the scribe's personal observations and the provider's statements to them. This document has been checked and approved by the attending provider.  I have reviewed the above documentation for accuracy and completeness and I agree with the above.  Patrici Ranks, MD

## 2015-09-19 NOTE — Patient Instructions (Signed)
[place] at [facility] Discharge Instructions  RECOMMENDATIONS MADE BY THE CONSULTANT AND ANY TEST RESULTS WILL BE SENT TO YOUR REFERRING PHYSICIAN.    Exam and discussion by Dr Whitney Muse today Port flush with labs today Monthly labs Port flush every 8 weeks  Return to see the doctor in 2 months  Please call the clinic if you have any questions or concerns   Thank you for choosing [place] at [facility] to provide your oncology and hematology care.  To afford each patient quality time with our provider, please arrive at least 15 minutes before your scheduled appointment time.   Beginning January 23rd 2017 lab work for the [facility] will be done in the  Main lab at [facility] on 1st floor. If you have a lab appointment with the [facility] please come in thru the  Main Entrance and check in at the main information desk  You need to re-schedule your appointment should you arrive 10 or more minutes late.  We strive to give you quality time with our providers, and arriving late affects you and other patients whose appointments are after yours.  Also, if you no show three or more times for appointments you may be dismissed from the clinic at the providers discretion.     Again, thank you for choosing [facility].  Our hope is that these requests will decrease the amount of time that you wait before being seen by our physicians.       _____________________________________________________________  Should you have questions after your visit to [facility], please contact our office at [phone] between the hours of 8:30 a.m. and 4:30 p.m.  Voicemails left after 4:30 p.m. will not be returned until the following business day.  For prescription refill requests, have your pharmacy contact our office.         Resources For Cancer Patients and their Caregivers ? American Cancer Society: Can assist with  transportation, wigs, general needs, runs Look Good Feel Better.        (925)434-7626 ? Cancer Care: Provides financial assistance, online support groups, medication/co-pay assistance.  1-800-813-HOPE (551)193-1735) ? [facility] Assists [place] cancer patients and their families through emotional , educational and financial support.  (279)740-7836 ? [place] DSS Where to apply for food stamps, Medicaid and utility assistance. (217) 523-5576 ? RCATS: Transportation to medical appointments. (934) 486-4431 ? Social Security Administration: May apply for disability if have a Stage IV cancer. 231-288-3617 330-631-6954 ? [employer], Disability and Transit Services: Assists with nutrition, care and transit needs. 870-311-5645

## 2015-09-20 LAB — PROTEIN ELECTROPHORESIS, SERUM
A/G Ratio: 1.6 (ref 0.7–1.7)
Albumin ELP: 2.9 g/dL (ref 2.9–4.4)
Alpha-1-Globulin: 0.2 g/dL (ref 0.0–0.4)
Alpha-2-Globulin: 0.6 g/dL (ref 0.4–1.0)
Beta Globulin: 0.8 g/dL (ref 0.7–1.3)
Gamma Globulin: 0.3 g/dL — ABNORMAL LOW (ref 0.4–1.8)
Globulin, Total: 1.8 g/dL — ABNORMAL LOW (ref 2.2–3.9)
Total Protein ELP: 4.7 g/dL — ABNORMAL LOW (ref 6.0–8.5)

## 2015-09-20 LAB — IGG, IGA, IGM
IgA: 56 mg/dL — ABNORMAL LOW (ref 90–386)
IgG (Immunoglobin G), Serum: 309 mg/dL — ABNORMAL LOW (ref 700–1600)
IgM, Serum: 42 mg/dL (ref 20–172)

## 2015-09-20 LAB — KAPPA/LAMBDA LIGHT CHAINS
Kappa free light chain: 23.11 mg/L — ABNORMAL HIGH (ref 3.30–19.40)
Kappa, lambda light chain ratio: 1.21 (ref 0.26–1.65)
Lambda free light chains: 19.16 mg/L (ref 5.71–26.30)

## 2015-09-20 LAB — IMMUNOFIXATION ELECTROPHORESIS
IgA: 59 mg/dL — ABNORMAL LOW (ref 90–386)
IgG (Immunoglobin G), Serum: 332 mg/dL — ABNORMAL LOW (ref 700–1600)
IgM, Serum: 45 mg/dL (ref 20–172)
Total Protein ELP: 5 g/dL — ABNORMAL LOW (ref 6.0–8.5)

## 2015-09-21 ENCOUNTER — Other Ambulatory Visit (INDEPENDENT_AMBULATORY_CARE_PROVIDER_SITE_OTHER): Payer: Self-pay | Admitting: Internal Medicine

## 2015-10-02 ENCOUNTER — Other Ambulatory Visit (HOSPITAL_COMMUNITY): Payer: Self-pay | Admitting: Oncology

## 2015-10-02 DIAGNOSIS — C9001 Multiple myeloma in remission: Secondary | ICD-10-CM

## 2015-10-02 MED ORDER — LENALIDOMIDE 5 MG PO CAPS: 5.0000 mg | ORAL_CAPSULE | Freq: Every day | ORAL | Status: DC

## 2015-10-03 ENCOUNTER — Telehealth (HOSPITAL_COMMUNITY): Payer: Self-pay | Admitting: *Deleted

## 2015-10-03 NOTE — Telephone Encounter (Signed)
Patient called for lab results. No result notes on labs please advise before I call patient. The most recent labs I see are from 4/19

## 2015-10-03 NOTE — Telephone Encounter (Signed)
Stable

## 2015-10-04 NOTE — Telephone Encounter (Signed)
Patient called.

## 2015-10-17 ENCOUNTER — Encounter (HOSPITAL_COMMUNITY): Payer: Managed Care, Other (non HMO) | Attending: Hematology & Oncology

## 2015-10-17 ENCOUNTER — Encounter (HOSPITAL_COMMUNITY): Payer: Self-pay | Admitting: *Deleted

## 2015-10-17 DIAGNOSIS — C9001 Multiple myeloma in remission: Secondary | ICD-10-CM | POA: Insufficient documentation

## 2015-10-17 LAB — COMPREHENSIVE METABOLIC PANEL
ALT: 118 U/L — ABNORMAL HIGH (ref 17–63)
AST: 98 U/L — ABNORMAL HIGH (ref 15–41)
Albumin: 3.2 g/dL — ABNORMAL LOW (ref 3.5–5.0)
Alkaline Phosphatase: 118 U/L (ref 38–126)
Anion gap: 5 (ref 5–15)
BUN: 31 mg/dL — ABNORMAL HIGH (ref 6–20)
CO2: 19 mmol/L — ABNORMAL LOW (ref 22–32)
Calcium: 8.3 mg/dL — ABNORMAL LOW (ref 8.9–10.3)
Chloride: 114 mmol/L — ABNORMAL HIGH (ref 101–111)
Creatinine, Ser: 1.6 mg/dL — ABNORMAL HIGH (ref 0.61–1.24)
GFR calc Af Amer: 54 mL/min — ABNORMAL LOW (ref >60)
GFR calc non Af Amer: 47 mL/min — ABNORMAL LOW (ref >60)
Glucose, Bld: 163 mg/dL — ABNORMAL HIGH (ref 65–99)
Potassium: 3.7 mmol/L (ref 3.5–5.1)
Sodium: 138 mmol/L (ref 135–145)
Total Bilirubin: 0.4 mg/dL (ref 0.3–1.2)
Total Protein: 5.5 g/dL — ABNORMAL LOW (ref 6.5–8.1)

## 2015-10-18 ENCOUNTER — Encounter (HOSPITAL_COMMUNITY): Payer: Self-pay | Admitting: *Deleted

## 2015-10-18 LAB — PROTEIN ELECTROPHORESIS, SERUM
A/G Ratio: 1.6 (ref 0.7–1.7)
Albumin ELP: 3 g/dL (ref 2.9–4.4)
Alpha-1-Globulin: 0.2 g/dL (ref 0.0–0.4)
Alpha-2-Globulin: 0.6 g/dL (ref 0.4–1.0)
Beta Globulin: 0.8 g/dL (ref 0.7–1.3)
Gamma Globulin: 0.3 g/dL — ABNORMAL LOW (ref 0.4–1.8)
Globulin, Total: 1.9 g/dL — ABNORMAL LOW (ref 2.2–3.9)
Total Protein ELP: 4.9 g/dL — ABNORMAL LOW (ref 6.0–8.5)

## 2015-10-18 LAB — KAPPA/LAMBDA LIGHT CHAINS
Kappa free light chain: 27.73 mg/L — ABNORMAL HIGH (ref 3.30–19.40)
Kappa, lambda light chain ratio: 1.27 (ref 0.26–1.65)
Lambda free light chains: 21.82 mg/L (ref 5.71–26.30)

## 2015-10-18 LAB — IGG, IGA, IGM
IgA: 56 mg/dL — ABNORMAL LOW (ref 90–386)
IgG (Immunoglobin G), Serum: 300 mg/dL — ABNORMAL LOW (ref 700–1600)
IgM, Serum: 42 mg/dL (ref 20–172)

## 2015-10-22 LAB — IMMUNOFIXATION ELECTROPHORESIS
IgA: 54 mg/dL — ABNORMAL LOW (ref 90–386)
IgG (Immunoglobin G), Serum: 308 mg/dL — ABNORMAL LOW (ref 700–1600)
IgM, Serum: 37 mg/dL (ref 20–172)
Total Protein ELP: 4.8 g/dL — ABNORMAL LOW (ref 6.0–8.5)

## 2015-10-26 ENCOUNTER — Other Ambulatory Visit (HOSPITAL_COMMUNITY): Payer: Self-pay | Admitting: Oncology

## 2015-10-26 DIAGNOSIS — C9001 Multiple myeloma in remission: Secondary | ICD-10-CM

## 2015-10-26 MED ORDER — LENALIDOMIDE 5 MG PO CAPS: 5.0000 mg | ORAL_CAPSULE | Freq: Every day | ORAL | Status: DC

## 2015-11-11 ENCOUNTER — Other Ambulatory Visit (HOSPITAL_COMMUNITY): Payer: Self-pay | Admitting: Oncology

## 2015-11-14 ENCOUNTER — Ambulatory Visit (HOSPITAL_COMMUNITY): Payer: Managed Care, Other (non HMO) | Admitting: Oncology

## 2015-11-14 ENCOUNTER — Encounter (HOSPITAL_COMMUNITY): Payer: Managed Care, Other (non HMO) | Attending: Oncology | Admitting: Oncology

## 2015-11-14 ENCOUNTER — Other Ambulatory Visit (INDEPENDENT_AMBULATORY_CARE_PROVIDER_SITE_OTHER): Payer: Self-pay | Admitting: Internal Medicine

## 2015-11-14 ENCOUNTER — Encounter (HOSPITAL_COMMUNITY): Payer: Self-pay | Admitting: Oncology

## 2015-11-14 ENCOUNTER — Encounter (HOSPITAL_COMMUNITY): Payer: Managed Care, Other (non HMO) | Attending: Hematology & Oncology

## 2015-11-14 DIAGNOSIS — C9000 Multiple myeloma not having achieved remission: Secondary | ICD-10-CM | POA: Insufficient documentation

## 2015-11-14 DIAGNOSIS — C9001 Multiple myeloma in remission: Secondary | ICD-10-CM

## 2015-11-14 DIAGNOSIS — D649 Anemia, unspecified: Secondary | ICD-10-CM

## 2015-11-14 LAB — CBC WITH DIFFERENTIAL/PLATELET
Basophils Absolute: 0 10*3/uL (ref 0.0–0.1)
Basophils Relative: 0 %
Eosinophils Absolute: 0.2 10*3/uL (ref 0.0–0.7)
Eosinophils Relative: 3 %
HCT: 32.3 % — ABNORMAL LOW (ref 39.0–52.0)
Hemoglobin: 10.6 g/dL — ABNORMAL LOW (ref 13.0–17.0)
Lymphocytes Relative: 29 %
Lymphs Abs: 1.9 10*3/uL (ref 0.7–4.0)
MCH: 31.3 pg (ref 26.0–34.0)
MCHC: 32.8 g/dL (ref 30.0–36.0)
MCV: 95.3 fL (ref 78.0–100.0)
Monocytes Absolute: 0.9 10*3/uL (ref 0.1–1.0)
Monocytes Relative: 14 %
Neutro Abs: 3.4 10*3/uL (ref 1.7–7.7)
Neutrophils Relative %: 54 %
Platelets: 143 10*3/uL — ABNORMAL LOW (ref 150–400)
RBC: 3.39 MIL/uL — ABNORMAL LOW (ref 4.22–5.81)
RDW: 16 % — ABNORMAL HIGH (ref 11.5–15.5)
WBC: 6.4 10*3/uL (ref 4.0–10.5)

## 2015-11-14 LAB — COMPREHENSIVE METABOLIC PANEL
ALT: 66 U/L — ABNORMAL HIGH (ref 17–63)
AST: 44 U/L — ABNORMAL HIGH (ref 15–41)
Albumin: 3.2 g/dL — ABNORMAL LOW (ref 3.5–5.0)
Alkaline Phosphatase: 91 U/L (ref 38–126)
Anion gap: 5 (ref 5–15)
BUN: 36 mg/dL — ABNORMAL HIGH (ref 6–20)
CO2: 23 mmol/L (ref 22–32)
Calcium: 8.4 mg/dL — ABNORMAL LOW (ref 8.9–10.3)
Chloride: 111 mmol/L (ref 101–111)
Creatinine, Ser: 1.75 mg/dL — ABNORMAL HIGH (ref 0.61–1.24)
GFR calc Af Amer: 48 mL/min — ABNORMAL LOW (ref >60)
GFR calc non Af Amer: 42 mL/min — ABNORMAL LOW (ref >60)
Glucose, Bld: 199 mg/dL — ABNORMAL HIGH (ref 65–99)
Potassium: 3.6 mmol/L (ref 3.5–5.1)
Sodium: 139 mmol/L (ref 135–145)
Total Bilirubin: 0.7 mg/dL (ref 0.3–1.2)
Total Protein: 5.4 g/dL — ABNORMAL LOW (ref 6.5–8.1)

## 2015-11-14 MED ORDER — SODIUM CHLORIDE 0.9% FLUSH
10.0000 mL | INTRAVENOUS | Status: DC | PRN
  Administered 2015-11-14: 10 mL via INTRAVENOUS
  Filled 2015-11-14: qty 10

## 2015-11-14 MED ORDER — HEPARIN SOD (PORK) LOCK FLUSH 100 UNIT/ML IV SOLN
500.0000 [IU] | Freq: Once | INTRAVENOUS | Status: AC
  Administered 2015-11-14: 500 [IU] via INTRAVENOUS

## 2015-11-14 NOTE — Patient Instructions (Signed)
[place] at [facility] Discharge Instructions  RECOMMENDATIONS MADE BY THE CONSULTANT AND ANY TEST RESULTS WILL BE SENT TO YOUR REFERRING PHYSICIAN.   Port flush with labs today Follow up as scheduled today Please call the clinic if you have any questions or concerns   Thank you for choosing [place] at [facility] to provide your oncology and hematology care.  To afford each patient quality time with our provider, please arrive at least 15 minutes before your scheduled appointment time.   Beginning January 23rd 2017 lab work for the [facility] will be done in the  Main lab at [facility] on 1st floor. If you have a lab appointment with the [facility] please come in thru the  Main Entrance and check in at the main information desk  You need to re-schedule your appointment should you arrive 10 or more minutes late.  We strive to give you quality time with our providers, and arriving late affects you and other patients whose appointments are after yours.  Also, if you no show three or more times for appointments you may be dismissed from the clinic at the providers discretion.     Again, thank you for choosing [facility].  Our hope is that these requests will decrease the amount of time that you wait before being seen by our physicians.       _____________________________________________________________  Should you have questions after your visit to [facility], please contact our office at [phone] between the hours of 8:30 a.m. and 4:30 p.m.  Voicemails left after 4:30 p.m. will not be returned until the following business day.  For prescription refill requests, have your pharmacy contact our office.         Resources For Cancer Patients and their Caregivers ? American Cancer Society: Can assist with transportation, wigs, general needs, runs Look Good Feel Better.        713 812 8455 ? Cancer  Care: Provides financial assistance, online support groups, medication/co-pay assistance.  1-800-813-HOPE 713-327-3066) ? [facility] Assists [place] cancer patients and their families through emotional , educational and financial support.  (818) 547-1630 ? [place] DSS Where to apply for food stamps, Medicaid and utility assistance. 361-157-5714 ? RCATS: Transportation to medical appointments. (249)657-9951 ? Social Security Administration: May apply for disability if have a Stage IV cancer. 303-263-6401 8142713291 ? [employer], Disability and Transit Services: Assists with nutrition, care and transit needs. [place] Support Programs: @10RELATIVEDAYS @ > Cancer Support Group  2nd Tuesday of the month 1pm-2pm, Journey Room  > Creative Journey  3rd Tuesday of the month 1130am-1pm, Journey Room  > Look Good Feel Better  1st Wednesday of the month 10am-12 noon, Journey Room (Call [place] to register [phone])

## 2015-11-14 NOTE — Progress Notes (Signed)
[name] presented for Portacath access and flush.  Proper placement of portacath confirmed by CXR.  Portacath located right chest wall accessed with  H 20 needle.  Good blood return present. Portacath flushed with 2ml NS and 500U/58ml Heparin and needle removed intact.  Procedure tolerated well and without incident.

## 2015-11-14 NOTE — Progress Notes (Signed)
Franklin Font, MD [address] [place] [address]  Anemia, normocytic normochromic - Plan: Vitamin B12, Folate, Iron and TIBC, Ferritin  Multiple myeloma in remission (HCC)  CURRENT THERAPY: Lenalidomide 5 mg 7 days on and 7 days off with 20 mg Dexamethasone BID every Friday only.  Last zometa 09/07/2014  INTERVAL HISTORY: [name] 55 y.o. male returns for followup of multiple myeloma, kappa light chain, Stage II disease (no evidence of monoclonal protein in serum).  At time of Dx, he was spilling 3 grams of protein in urine (Bence Jones proteinuria) and bone marrow aspiration and biopsy at diagnosis demonstrated 19%plasma cells, kappa restricted.  He was evaluated at [facility] for BMT, and he was not felt to be a good candidate.    He is doing well.  He continues with diarrhea that is followed by Dr. Laural Golden (GI).  He notes that it is at baseline.  He has altered his lifestyle to accommodate his BMs.  His wedding anniversary is upcoming this weekend and he plans a trip to [place], [place] with with his wife.  He continues on maintenance [name] and he is not interested in any treatment changes.  His mother passed from MM which is influencing his decisions moving forward.  He continues to work with podiatry regarding his left foot issues.  Review of Systems  Constitutional: Negative.   HENT: Negative.   Eyes: Negative.   Respiratory: Negative.   Cardiovascular: Negative.   Gastrointestinal: Positive for diarrhea (at baseline). Negative for nausea, vomiting, abdominal pain, constipation, blood in stool and melena.  Genitourinary: Negative.   Musculoskeletal: Negative.        Left foot sores, in walking boot  Skin: Negative.   Neurological: Negative.   Endo/Heme/Allergies: Negative.   Psychiatric/Behavioral: Negative.     Past Medical History  Diagnosis Date  . Arteriosclerotic cardiovascular disease (ASCVD)     MI-2000s; stent to the proximal LAD and  diagonal in 2001; stress nuclear in 2008-impaired exercise capacity, left ventricular dilatation, moderately to severely depressed EF, apical, inferior and anteroseptal scar  . Hypertension   . 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 Diabetes mellitus     Insulin  . GERD (gastroesophageal reflux disease)   . Pedal edema     Venous insufficiency  . Obesity   . Hyperlipidemia   . Injection site reaction   . Cellulitis of leg     both legs  . Chronic kidney disease, stage 3, mod decreased GFR     Creatinine of 1.84 in 06/2011 and 1.5 in 07/2011  . Chronic diarrhea   . Sleep apnea     uses cpap  . Ulcer   . Atrial flutter ([place])   . Multiple myeloma 07/01/2011  . Myocardial infarction ([place]) 2000  . Gout     Past Surgical History  Procedure Laterality Date  . Laparoscopic gastric banding  2006    has been removed  . Wrist surgery      Left; removal of bone fragment  . Incision and drainage abscess anal    . Abscess drainage      Scrotal  . Bone marrow biopsy  05/13/11  . Portacath placement  07/07/2011    Procedure: INSERTION PORT-A-CATH;  Surgeon: Scherry Ran, MD;  Location: [facility] ORS;  Service: General;  Laterality: N/A;  . Colonoscopy  11/28/2011    Procedure: COLONOSCOPY;  Surgeon: Rogene Houston, MD;  Location: [facility] END[facility] SUITE;  Service: Endoscopy;  Laterality: N/A;  930  . Esophagogastroduodenoscopy  01/02/2012    Procedure: ESOPHAGOGASTRODUODENOSCOPY (EGD);  Surgeon: Rogene Houston, MD;  Location: [facility] SU[facility];  Service: Endoscopy;  Laterality: N/A;  100  . Biopsy  01/02/2012    Procedure: BIOPSY;  Surgeon: Rogene Houston, MD;  Location: [facility];  Service: Endoscopy;  Laterality: N/A;  . Esophagogastroduodenoscopy N/A 09/20/2012    Procedure: ESOPHAGOGASTRODUODENOSCOPY (EGD);  Surgeon: Rogene Houston, MD;  Location: [facility];  Service: Endoscopy;  Laterality: N/A;  . Eus N/A 10/07/2012    Procedure: UPPER ENDOSCOPIC ULTRASOUND (EUS) LINEAR;  Surgeon: Milus Banister,  MD;  Location: [facility] ENDOSCOPY;  Service: Endoscopy;  Laterality: N/A;  . Cardioversion N/A 10/13/2012    Procedure: CARDIOVERSION;  Surgeon: Yehuda Savannah, MD;  Location: [facility] ORS;  Service: Cardiovascular;  Laterality: N/A;  . Cardiac catheterization      cardiac stent  . Port-a-cath removal Left 12/07/2012    Procedure: REMOVAL PORT-A-CATH;  Surgeon: Scherry Ran, MD;  Location: [facility];  Service: General;  Laterality: Left;  . Portacath placement N/A 12/07/2012    Procedure: INSERTION PORT-A-CATH;  Surgeon: Scherry Ran, MD;  Location: [facility];  Service: General;  Laterality: N/A;  Attempted portacath placement on left and right side  . Cataract extraction w/phaco Left 02/13/2014    Procedure: CATARACT EXTRACTION PHACO AND INTRAOCULAR LENS PLACEMENT (IOC);  Surgeon: Tonny Branch, MD;  Location: [facility];  Service: Ophthalmology;  Laterality: Left;  CDE:  7.67  . Cataract extraction w/phaco Right 03/02/2014    Procedure: CATARACT EXTRACTION PHACO AND INTRAOCULAR LENS PLACEMENT RIGHT EYE CDE=16.81;  Surgeon: Tonny Branch, MD;  Location: [facility];  Service: Ophthalmology;  Laterality: Right;    Family History  Problem Relation Age of Onset  . Heart disease Mother   . Cancer Mother   . Diabetes Father   . Anesthesia problems Neg Hx   . Hypotension Neg Hx   . Malignant hyperthermia Neg Hx   . Pseudochol deficiency Neg Hx   . Arthritis      Social History   Social History  . Marital Status: Married    Spouse Name: N/A  . Number of Children: N/A  . Years of Education: 16   Occupational History  . Nurse, learning disability   . Funeral Director    Social History Main Topics  . Smoking status: Former Smoker -- 0.25 packs/day for 1 years    Types: Cigarettes, Cigars    Quit date: 05/17/2001  . Smokeless tobacco: Never Used  . Alcohol Use: No  . Drug Use: No  . Sexual Activity: Yes    Birth Control/ Protection: None   Other Topics Concern  . None   Social History Narrative      PHYSICAL EXAMINATION  ECOG PERFORMANCE STATUS: 1 - Symptomatic but completely ambulatory  There were no vitals filed for this visit.  Blood pressure 127/70 Pulse 54 Temperature 97.17F Respirations 18  GENERAL:alert, no distress, well nourished, well developed, comfortable, cooperative, obese, smiling and unaccompanied SKIN: skin color, texture, turgor are normal, no rashes or significant lesions HEAD: Normocephalic, No masses, lesions, tenderness or abnormalities EYES: normal, Conjunctiva are pink and non-injected EARS: External ears normal OROPHARYNX:lips, buccal mucosa, and tongue normal and mucous membranes are moist  NECK: supple, trachea midline LYMPH:  no palpable lymphadenopathy BREAST:not examined LUNGS: clear to auscultation and percussion HEART: regular rate & rhythm, no murmurs, no gallops, S1 normal and S2 normal ABDOMEN:abdomen  soft, obese and normal bowel sounds BACK: Back symmetric, no curvature. EXTREMITIES:less then 2 second capillary refill, no joint deformities, effusion, or inflammation, no skin discoloration, no cyanosis, positive findings:  Left foot is in a walking boot  NEURO: alert & oriented x 3 with fluent speech, no focal motor/sensory deficits, gait normal with a cane.   LABORATORY DATA: CBC    Component Value Date/Time   WBC 6.4 11/14/2015 1430   RBC 3.39* 11/14/2015 1430   HGB 10.6* 11/14/2015 1430   HCT 32.3* 11/14/2015 1430   PLT 143* 11/14/2015 1430   MCV 95.3 11/14/2015 1430   MCH 31.3 11/14/2015 1430   MCHC 32.8 11/14/2015 1430   RDW 16.0* 11/14/2015 1430   LYMPHSABS 1.9 11/14/2015 1430   MONOABS 0.9 11/14/2015 1430   EOSABS 0.2 11/14/2015 1430   BASOSABS 0.0 11/14/2015 1430      Chemistry      Component Value Date/Time   NA 138 10/17/2015 1205   K 3.7 10/17/2015 1205   CL 114* 10/17/2015 1205   CO2 19* 10/17/2015 1205   BUN 31* 10/17/2015 1205   CREATININE 1.60* 10/17/2015 1205   CREATININE 2.24* 10/05/2012 1411       Component Value Date/Time   CALCIUM 8.3* 10/17/2015 1205   CALCIUM 8.8 03/08/2015 1410   ALKPHOS 118 10/17/2015 1205   AST 98* 10/17/2015 1205   ALT 118* 10/17/2015 1205   BILITOT 0.4 10/17/2015 1205        PENDING LABS:   RADIOGRAPHIC STUDIES:  No results found.   PATHOLOGY:    ASSESSMENT AND PLAN:  Multiple myeloma Stage II, multiple myeloma (kappa light chain specificity) with no evidence of serum m-protein; S/P [facility] evaluate for transplant and not felt to be a candidate.  Bone marrow aspiration and biopsy at the time of diagnosis demonstrated 19% plasma cells and urine SPEP demonstrated 3 g of bence-jones proteins.  Bone marrow aspiration and biopsy on 09/28/2014 showed variably cellular marrow with trilineage hematopoiesis and 2% plasma cells. IHC confirmed the presence of a minor plasma cell component which appears to show polyclonal staining pattern for Kapp and lambda light chain. Latter appearance is nonspecific and not diagnostic of a plasma cell neoplasm. Background shows trilineage hematopoiesis with nonspecific changes.  He continues on Lenalidomide 5 mg 7 days on and 7 days off with 20 mg Dexamethasone BID every Friday only.  Last zometa 09/07/2014  Labs are drawn today with port flush: CBC diff, CMET, SPEP+IFE, and light chain assay.  I personally reviewed and went over laboratory results with the patient.  The results are noted within this dictation.  He is educated on what specifically we follow with regards to his labs and MM monitoring.  He is educated regarding M-Spike, Kappa and lambda light chains, and Kappa:Lambda ratio.  He had a difficult time understanding the ratio, but by the endo of our conversation, I think he understood better.  It is listed in his chart that he prefers MyChart messages for updates of lab results, but he reports today that he prefers telephone updates.  Anemia is noted.  I will therefore add some additional labs to his next lab  appointment.  He continues to follow with his podiatrist, Dr. Caprice Beaver who currently has [place] in a left foot walking shoe.  Labs monthly: CBC diff, CMET, SPEP+IFE, and light chain assay.  Additional labs in 4 weeks: anemia panel.  He will continue on lenalidomide 5 mg 7 days on and 7 days off with  20 mg Dexamethasone BID every Friday only.  Last zometa was 09/07/2014 due to concerns regarding potential osteonecrosis of jaw.  Return in 8 weeks for follow-up.    ORDERS PLACED FOR THIS ENCOUNTER: Orders Placed This Encounter  Procedures  . Vitamin B12  . Folate  . Iron and TIBC  . Ferritin    MEDICATIONS PRESCRIBED THIS ENCOUNTER: No orders of the defined types were placed in this encounter.    THERAPY PLAN:  Continue maintenance therapy as planned.    All questions were answered. The patient knows to call the clinic with any problems, questions or concerns. We can certainly see the patient much sooner if necessary.  Patient and plan discussed with Dr. Ancil Linsey and she is in agreement with the aforementioned.   This note is electronically signed by: Robynn Pane, PA-C 11/14/2015 6:01 PM

## 2015-11-14 NOTE — Patient Instructions (Signed)
[place] at [facility] Discharge Instructions  RECOMMENDATIONS MADE BY THE CONSULTANT AND ANY TEST RESULTS WILL BE SENT TO YOUR REFERRING PHYSICIAN.  Labs in 4 weeks Labs in 8 weeks with follow up Please call the clinic with your concerns   Thank you for choosing [place] at [facility] to provide your oncology and hematology care.  To afford each patient quality time with our provider, please arrive at least 15 minutes before your scheduled appointment time.   Beginning January 23rd 2017 lab work for the [facility] will be done in the  Main lab at [facility] on 1st floor. If you have a lab appointment with the [facility] please come in thru the  Main Entrance and check in at the main information desk  You need to re-schedule your appointment should you arrive 10 or more minutes late.  We strive to give you quality time with our providers, and arriving late affects you and other patients whose appointments are after yours.  Also, if you no show three or more times for appointments you may be dismissed from the clinic at the providers discretion.     Again, thank you for choosing [facility].  Our hope is that these requests will decrease the amount of time that you wait before being seen by our physicians.       _____________________________________________________________  Should you have questions after your visit to [facility], please contact our office at [phone] between the hours of 8:30 a.m. and 4:30 p.m.  Voicemails left after 4:30 p.m. will not be returned until the following business day.  For prescription refill requests, have your pharmacy contact our office.         Resources For Cancer Patients and their Caregivers ? American Cancer Society: Can assist with transportation, wigs, general needs, runs Look Good Feel Better.        813-449-6931 ? Cancer Care: Provides financial  assistance, online support groups, medication/co-pay assistance.  1-800-813-HOPE 351-468-1085) ? [facility] Assists [place] cancer patients and their families through emotional , educational and financial support.  757 703 7205 ? [place] DSS Where to apply for food stamps, Medicaid and utility assistance. (414)233-2416 ? RCATS: Transportation to medical appointments. 757-363-9906 ? Social Security Administration: May apply for disability if have a Stage IV cancer. 201-638-0482 224-708-2394 ? [employer], Disability and Transit Services: Assists with nutrition, care and transit needs. [place] Support Programs: @10RELATIVEDAYS @ > Cancer Support Group  2nd Tuesday of the month 1pm-2pm, Journey Room  > Creative Journey  3rd Tuesday of the month 1130am-1pm, Journey Room  > Look Good Feel Better  1st Wednesday of the month 10am-12 noon, Journey Room (Call [facility] to register [phone])

## 2015-11-14 NOTE — Assessment & Plan Note (Addendum)
Stage II, multiple myeloma (kappa light chain specificity) with no evidence of serum m-protein; S/P [facility] evaluate for transplant and not felt to be a candidate.  Bone marrow aspiration and biopsy at the time of diagnosis demonstrated 19% plasma cells and urine SPEP demonstrated 3 g of bence-jones proteins.  Bone marrow aspiration and biopsy on 09/28/2014 showed variably cellular marrow with trilineage hematopoiesis and 2% plasma cells. IHC confirmed the presence of a minor plasma cell component which appears to show polyclonal staining pattern for Kapp and lambda light chain. Latter appearance is nonspecific and not diagnostic of a plasma cell neoplasm. Background shows trilineage hematopoiesis with nonspecific changes.  He continues on Lenalidomide 5 mg 7 days on and 7 days off with 20 mg Dexamethasone BID every Friday only.  Last zometa 09/07/2014  Labs are drawn today with port flush: CBC diff, CMET, SPEP+IFE, and light chain assay.  I personally reviewed and went over laboratory results with the patient.  The results are noted within this dictation.  He is educated on what specifically we follow with regards to his labs and MM monitoring.  He is educated regarding M-Spike, Kappa and lambda light chains, and Kappa:Lambda ratio.  He had a difficult time understanding the ratio, but by the endo of our conversation, I think he understood better.  It is listed in his chart that he prefers MyChart messages for updates of lab results, but he reports today that he prefers telephone updates.  Anemia is noted.  I will therefore add some additional labs to his next lab appointment.  He continues to follow with his podiatrist, Dr. Caprice Beaver who currently has [place] in a left foot walking shoe.  Labs monthly: CBC diff, CMET, SPEP+IFE, and light chain assay.  Additional labs in 4 weeks: anemia panel.  He will continue on lenalidomide 5 mg 7 days on and 7 days off with 20 mg Dexamethasone BID every Friday only.  Last  zometa was 09/07/2014 due to concerns regarding potential osteonecrosis of jaw.  Return in 8 weeks for follow-up.

## 2015-11-15 LAB — PROTEIN ELECTROPHORESIS, SERUM
A/G Ratio: 1.5 (ref 0.7–1.7)
Albumin ELP: 2.8 g/dL — ABNORMAL LOW (ref 2.9–4.4)
Alpha-1-Globulin: 0.2 g/dL (ref 0.0–0.4)
Alpha-2-Globulin: 0.6 g/dL (ref 0.4–1.0)
Beta Globulin: 0.8 g/dL (ref 0.7–1.3)
Gamma Globulin: 0.3 g/dL — ABNORMAL LOW (ref 0.4–1.8)
Globulin, Total: 1.9 g/dL — ABNORMAL LOW (ref 2.2–3.9)
Total Protein ELP: 4.7 g/dL — ABNORMAL LOW (ref 6.0–8.5)

## 2015-11-15 LAB — KAPPA/LAMBDA LIGHT CHAINS
Kappa free light chain: 26.2 mg/L — ABNORMAL HIGH (ref 3.3–19.4)
Kappa, lambda light chain ratio: 1.36 (ref 0.26–1.65)
Lambda free light chains: 19.3 mg/L (ref 5.7–26.3)

## 2015-11-15 LAB — IGG, IGA, IGM
IgA: 36 mg/dL — ABNORMAL LOW (ref 90–386)
IgG (Immunoglobin G), Serum: 290 mg/dL — ABNORMAL LOW (ref 700–1600)
IgM, Serum: 38 mg/dL (ref 20–172)

## 2015-11-16 LAB — IMMUNOFIXATION ELECTROPHORESIS
IgA: 37 mg/dL — ABNORMAL LOW (ref 90–386)
IgG (Immunoglobin G), Serum: 286 mg/dL — ABNORMAL LOW (ref 700–1600)
IgM, Serum: 38 mg/dL (ref 20–172)
Total Protein ELP: 4.8 g/dL — ABNORMAL LOW (ref 6.0–8.5)

## 2015-11-23 ENCOUNTER — Other Ambulatory Visit (HOSPITAL_COMMUNITY): Payer: Self-pay | Admitting: Oncology

## 2015-11-23 DIAGNOSIS — C9001 Multiple myeloma in remission: Secondary | ICD-10-CM

## 2015-11-23 MED ORDER — LENALIDOMIDE 5 MG PO CAPS: 5.0000 mg | ORAL_CAPSULE | Freq: Every day | ORAL | Status: DC

## 2015-11-25 ENCOUNTER — Other Ambulatory Visit (HOSPITAL_COMMUNITY): Payer: Self-pay | Admitting: Oncology

## 2015-11-27 ENCOUNTER — Other Ambulatory Visit (HOSPITAL_COMMUNITY): Payer: Self-pay | Admitting: Oncology

## 2015-12-06 ENCOUNTER — Telehealth (INDEPENDENT_AMBULATORY_CARE_PROVIDER_SITE_OTHER): Payer: Self-pay | Admitting: Internal Medicine

## 2015-12-06 NOTE — Telephone Encounter (Signed)
Patient called and scheduled an appointment for 02/26/16 with Dr. Laural Golden.  I offered him an earlier appointment with Terri, he refused.  He would like samples of Creon.  [phone]

## 2015-12-06 NOTE — Telephone Encounter (Signed)
I will check on samples, if we have them I will contact the patient.

## 2015-12-07 NOTE — Telephone Encounter (Signed)
I called the patient and left him a message that his samples were ready and that he could pick them up by 11:30am today or next week.

## 2015-12-12 ENCOUNTER — Encounter (HOSPITAL_COMMUNITY): Payer: Managed Care, Other (non HMO) | Attending: Oncology

## 2015-12-12 VITALS — BP 110/54 | HR 85 | Resp 18

## 2015-12-12 DIAGNOSIS — Z95828 Presence of other vascular implants and grafts: Secondary | ICD-10-CM

## 2015-12-12 DIAGNOSIS — C9000 Multiple myeloma not having achieved remission: Secondary | ICD-10-CM

## 2015-12-12 DIAGNOSIS — D649 Anemia, unspecified: Secondary | ICD-10-CM

## 2015-12-12 LAB — FERRITIN: Ferritin: 69 ng/mL (ref 24–336)

## 2015-12-12 LAB — VITAMIN B12: Vitamin B-12: 496 pg/mL (ref 180–914)

## 2015-12-12 LAB — IRON AND TIBC
Iron: 94 ug/dL (ref 45–182)
Saturation Ratios: 28 % (ref 17.9–39.5)
TIBC: 339 ug/dL (ref 250–450)
UIBC: 245 ug/dL

## 2015-12-12 LAB — FOLATE: Folate: 20.1 ng/mL (ref >5.9)

## 2015-12-12 MED ORDER — HEPARIN SOD (PORK) LOCK FLUSH 100 UNIT/ML IV SOLN
500.0000 [IU] | Freq: Once | INTRAVENOUS | Status: AC
  Administered 2015-12-12: 500 [IU] via INTRAVENOUS
  Filled 2015-12-12: qty 5

## 2015-12-12 MED ORDER — SODIUM CHLORIDE 0.9% FLUSH
10.0000 mL | INTRAVENOUS | Status: DC | PRN
  Administered 2015-12-12: 10 mL via INTRAVENOUS
  Filled 2015-12-12: qty 10

## 2015-12-12 NOTE — Patient Instructions (Signed)
[facility] at [facility] Discharge Instructions  RECOMMENDATIONS MADE BY THE CONSULTANT AND ANY TEST RESULTS WILL BE SENT TO YOUR REFERRING PHYSICIAN.  Port flush with labs done today. Follow up as scheduled Call for any concerns or questions  Thank you for choosing  [facility] at [facility] to provide your oncology and hematology care.  To afford each patient quality time with our provider, please arrive at least 15 minutes before your scheduled appointment time.   Beginning January 23rd 2017 lab work for the Cancer Center will be done in the  Main lab at [name] on 1st floor. If you have a lab appointment with the Cancer Center please come in thru the  Main Entrance and check in at the main information desk  You need to re-schedule your appointment should you arrive 10 or more minutes late.  We strive to give you quality time with our providers, and arriving late affects you and other patients whose appointments are after yours.  Also, if you no show three or more times for appointments you may be dismissed from the clinic at the providers discretion.     Again, thank you for choosing [facility].  Our hope is that these requests will decrease the amount of time that you wait before being seen by our physicians.       _____________________________________________________________  Should you have questions after your visit to [facility], please contact our office at [phone] between the hours of 8:30 a.m. and 4:30 p.m.  Voicemails left after 4:30 p.m. will not be returned until the following business day.  For prescription refill requests, have your pharmacy contact our office.         Resources For Cancer Patients and their Caregivers ? American Cancer Society: Can assist with transportation, wigs, general needs, runs Look Good Feel Better.        1-888-227-6333 ? Cancer Care: Provides financial  assistance, online support groups, medication/co-pay assistance.  1-800-813-HOPE (4673) ? [facility] Assists [place] cancer patients and their families through emotional , educational and financial support.  336-427-4357 ? [place] DSS Where to apply for food stamps, Medicaid and utility assistance. 336-342-1394 ? RCATS: Transportation to medical appointments. 336-347-2287 ? Social Security Administration: May apply for disability if have a Stage IV cancer. 336-342-7796 1-800-772-1213 ? [place] Aging, Disability and Transit Services: Assists with nutrition, care and transit needs. 336-349-2343  Cancer Center Support Programs: @10RELATIVEDAYS@ > Cancer Support Group  2nd Tuesday of the month 1pm-2pm, Journey Room  > Creative Journey  3rd Tuesday of the month 1130am-1pm, Journey Room  > Look Good Feel Better  1st Wednesday of the month 10am-12 noon, Journey Room (Call [employer] to register 1-800-395-5775)   

## 2015-12-12 NOTE — Progress Notes (Signed)
[name] presented for Portacath access and flush. Portacath located right chest wall accessed with  H 20 needle. Good blood return present. Portacath flushed with 17ml NS and 500U/72ml Heparin and needle removed intact. Procedure without incident. Patient tolerated procedure well.  Labs drawn as well.

## 2015-12-17 ENCOUNTER — Telehealth (HOSPITAL_COMMUNITY): Payer: Self-pay | Admitting: *Deleted

## 2015-12-19 ENCOUNTER — Other Ambulatory Visit (HOSPITAL_COMMUNITY): Payer: Self-pay | Admitting: Oncology

## 2015-12-19 DIAGNOSIS — C9001 Multiple myeloma in remission: Secondary | ICD-10-CM

## 2015-12-19 MED ORDER — LENALIDOMIDE 5 MG PO CAPS: 5.0000 mg | ORAL_CAPSULE | Freq: Every day | ORAL | Status: DC

## 2016-01-04 ENCOUNTER — Other Ambulatory Visit: Payer: Self-pay | Admitting: Cardiovascular Disease

## 2016-01-04 ENCOUNTER — Other Ambulatory Visit (INDEPENDENT_AMBULATORY_CARE_PROVIDER_SITE_OTHER): Payer: Self-pay | Admitting: Internal Medicine

## 2016-01-04 DIAGNOSIS — K589 Irritable bowel syndrome without diarrhea: Secondary | ICD-10-CM

## 2016-01-10 ENCOUNTER — Encounter (HOSPITAL_COMMUNITY): Payer: Managed Care, Other (non HMO) | Attending: Oncology | Admitting: Oncology

## 2016-01-10 ENCOUNTER — Encounter (HOSPITAL_COMMUNITY): Payer: Self-pay | Admitting: Oncology

## 2016-01-10 ENCOUNTER — Encounter (HOSPITAL_BASED_OUTPATIENT_CLINIC_OR_DEPARTMENT_OTHER): Payer: Managed Care, Other (non HMO)

## 2016-01-10 DIAGNOSIS — C9000 Multiple myeloma not having achieved remission: Secondary | ICD-10-CM | POA: Insufficient documentation

## 2016-01-10 DIAGNOSIS — C9001 Multiple myeloma in remission: Secondary | ICD-10-CM

## 2016-01-10 LAB — CBC WITH DIFFERENTIAL/PLATELET
Basophils Absolute: 0 10*3/uL (ref 0.0–0.1)
Basophils Relative: 0 %
Eosinophils Absolute: 0.2 10*3/uL (ref 0.0–0.7)
Eosinophils Relative: 3 %
HCT: 32 % — ABNORMAL LOW (ref 39.0–52.0)
Hemoglobin: 10.7 g/dL — ABNORMAL LOW (ref 13.0–17.0)
Lymphocytes Relative: 30 %
Lymphs Abs: 2.4 10*3/uL (ref 0.7–4.0)
MCH: 31.9 pg (ref 26.0–34.0)
MCHC: 33.4 g/dL (ref 30.0–36.0)
MCV: 95.5 fL (ref 78.0–100.0)
Monocytes Absolute: 0.9 10*3/uL (ref 0.1–1.0)
Monocytes Relative: 11 %
Neutro Abs: 4.5 10*3/uL (ref 1.7–7.7)
Neutrophils Relative %: 56 %
Platelets: 141 10*3/uL — ABNORMAL LOW (ref 150–400)
RBC: 3.35 MIL/uL — ABNORMAL LOW (ref 4.22–5.81)
RDW: 15.6 % — ABNORMAL HIGH (ref 11.5–15.5)
WBC: 7.9 10*3/uL (ref 4.0–10.5)

## 2016-01-10 LAB — COMPREHENSIVE METABOLIC PANEL
ALT: 91 U/L — ABNORMAL HIGH (ref 17–63)
AST: 66 U/L — ABNORMAL HIGH (ref 15–41)
Albumin: 3.2 g/dL — ABNORMAL LOW (ref 3.5–5.0)
Alkaline Phosphatase: 107 U/L (ref 38–126)
Anion gap: 5 (ref 5–15)
BUN: 35 mg/dL — ABNORMAL HIGH (ref 6–20)
CO2: 23 mmol/L (ref 22–32)
Calcium: 8.4 mg/dL — ABNORMAL LOW (ref 8.9–10.3)
Chloride: 112 mmol/L — ABNORMAL HIGH (ref 101–111)
Creatinine, Ser: 1.83 mg/dL — ABNORMAL HIGH (ref 0.61–1.24)
GFR calc Af Amer: 46 mL/min — ABNORMAL LOW (ref >60)
GFR calc non Af Amer: 40 mL/min — ABNORMAL LOW (ref >60)
Glucose, Bld: 132 mg/dL — ABNORMAL HIGH (ref 65–99)
Potassium: 3.8 mmol/L (ref 3.5–5.1)
Sodium: 140 mmol/L (ref 135–145)
Total Bilirubin: 0.4 mg/dL (ref 0.3–1.2)
Total Protein: 5.5 g/dL — ABNORMAL LOW (ref 6.5–8.1)

## 2016-01-10 MED ORDER — HEPARIN SOD (PORK) LOCK FLUSH 100 UNIT/ML IV SOLN
500.0000 [IU] | Freq: Once | INTRAVENOUS | Status: AC
  Administered 2016-01-10: 500 [IU] via INTRAVENOUS

## 2016-01-10 MED ORDER — SODIUM CHLORIDE 0.9% FLUSH
10.0000 mL | INTRAVENOUS | Status: DC | PRN
  Administered 2016-01-10: 10 mL via INTRAVENOUS
  Filled 2016-01-10: qty 10

## 2016-01-10 NOTE — Progress Notes (Signed)
[name] presented for Portacath access and flush.  Portacath located right chest wall accessed with  H 20 needle.  Good blood return present. Portacath flushed with 20ml NS and 500U/5ml Heparin and needle removed intact.  Procedure tolerated well and without incident.   

## 2016-01-10 NOTE — Patient Instructions (Signed)
[place] at [facility] Discharge Instructions  RECOMMENDATIONS MADE BY THE CONSULTANT AND ANY TEST RESULTS WILL BE SENT TO YOUR REFERRING PHYSICIAN.  You were seen by [name] today. Return in 8 weeks for follow up, port flush and labs Port flush and labs in 4 weeks Please call the center with any related concerns.  Thank you for choosing [place] at [facility] to provide your oncology and hematology care.  To afford each patient quality time with our provider, please arrive at least 15 minutes before your scheduled appointment time.   Beginning January 23rd 2017 lab work for the [facility] will be done in the  Main lab at [facility] on 1st floor. If you have a lab appointment with the [facility] please come in thru the  Main Entrance and check in at the main information desk  You need to re-schedule your appointment should you arrive 10 or more minutes late.  We strive to give you quality time with our providers, and arriving late affects you and other patients whose appointments are after yours.  Also, if you no show three or more times for appointments you may be dismissed from the clinic at the providers discretion.     Again, thank you for choosing [facility].  Our hope is that these requests will decrease the amount of time that you wait before being seen by our physicians.       _____________________________________________________________  Should you have questions after your visit to [facility], please contact our office at [phone] between the hours of 8:30 a.m. and 4:30 p.m.  Voicemails left after 4:30 p.m. will not be returned until the following business day.  For prescription refill requests, have your pharmacy contact our office.         Resources For Cancer Patients and their Caregivers ? American Cancer Society: Can assist with transportation, wigs, general needs, runs Look Good Feel  Better.        304-770-2368 ? Cancer Care: Provides financial assistance, online support groups, medication/co-pay assistance.  1-800-813-HOPE 604-292-1172) ? [facility] Assists [place] cancer patients and their families through emotional , educational and financial support.  (539)104-4638 ? [place] DSS Where to apply for food stamps, Medicaid and utility assistance. 7374917008 ? RCATS: Transportation to medical appointments. 951-378-0709 ? Social Security Administration: May apply for disability if have a Stage IV cancer. 506-379-3375 (740)432-6910 ? [employer], Disability and Transit Services: Assists with nutrition, care and transit needs. [place] Support Programs: @10RELATIVEDAYS @ > Cancer Support Group  2nd Tuesday of the month 1pm-2pm, Journey Room  > Creative Journey  3rd Tuesday of the month 1130am-1pm, Journey Room  > Look Good Feel Better  1st Wednesday of the month 10am-12 noon, Journey Room (Call [facility] to register [phone])

## 2016-01-10 NOTE — Assessment & Plan Note (Addendum)
Stage II, multiple myeloma (kappa light chain specificity) with no evidence of serum m-protein; S/P [facility] evaluate for transplant and not felt to be a candidate.  Bone marrow aspiration and biopsy at the time of diagnosis demonstrated 19% plasma cells and urine SPEP demonstrated 3 g of bence-jones proteins.  Bone marrow aspiration and biopsy on 09/28/2014 showed variably cellular marrow with trilineage hematopoiesis and 2% plasma cells. IHC confirmed the presence of a minor plasma cell component which appears to show polyclonal staining pattern for Kapp and lambda light chain. Latter appearance is nonspecific and not diagnostic of a plasma cell neoplasm. Background shows trilineage hematopoiesis with nonspecific changes.  He continues on Lenalidomide 5 mg 7 days on and 7 days off with 20 mg Dexamethasone BID every Friday only.  Last zometa 09/07/2014  Labs are drawn today with port flush: CBC diff, CMET, SPEP+IFE, and light chain assay.  I personally reviewed and went over laboratory results with the patient.  The results are noted within this dictation.    He continues to follow with his podiatrist, Dr. Caprice Beaver who currently has Gaius in a left foot walking shoe.  Labs monthly: CBC diff, CMET, SPEP+IFE, and light chain assay.  He will continue on lenalidomide 5 mg 7 days on and 7 days off with 20 mg Dexamethasone BID every Friday only.  Last zometa was 09/07/2014 due to concerns regarding potential osteonecrosis of jaw.  Return in 8 weeks for follow-up.  I think if he is well in 2 months, we can consider spacing out appointments to every 3 months.

## 2016-01-10 NOTE — Progress Notes (Signed)
[name], MD [address] [place] [address]  Multiple myeloma in remission [facility])  CURRENT THERAPY: Lenalidomide 5 mg 7 days on and 7 days off with 20 mg Dexamethasone BID every Friday only.  Last zometa 09/07/2014  INTERVAL HISTORY: [name] 55 y.o. male returns for followup of multiple myeloma, kappa light chain, Stage II disease (no evidence of monoclonal protein in serum).  At time of Dx, he was spilling 3 grams of protein in urine (Bence Jones proteinuria) and bone marrow aspiration and biopsy at diagnosis demonstrated 19%plasma cells, kappa restricted.  He was evaluated at [facility] for BMT, and he was not felt to be a good candidate.    He is doing well.  He continues with diarrhea that is followed by Dr. Laural Golden (GI).  He notes that it is at baseline.  He has altered his lifestyle to accommodate his BMs. He notes an upcoming appointment with Dr. Laural Golden.  "I feel great." His birthday is upcoming. He denies any interesting plans upcoming for his birthday.  He continues on maintenance [name] and he is not interested in any treatment changes.  His mother passed from MM which is influencing his decisions moving forward.  He continues to work with podiatry regarding his left foot issues.  Review of Systems  Constitutional: Negative.   HENT: Negative.   Eyes: Negative.   Respiratory: Negative.   Cardiovascular: Negative.   Gastrointestinal: Positive for diarrhea (at baseline). Negative for abdominal pain, blood in stool, constipation, melena, nausea and vomiting.  Genitourinary: Negative.   Musculoskeletal: Negative.        Left foot sores, in walking boot  Skin: Negative.   Neurological: Negative.   Endo/Heme/Allergies: Negative.   Psychiatric/Behavioral: Negative.     Past Medical History:  Diagnosis Date  . Arteriosclerotic cardiovascular disease (ASCVD)    MI-2000s; stent to the proximal LAD and diagonal in 2001; stress nuclear in 2008-impaired  exercise capacity, left ventricular dilatation, moderately to severely depressed EF, apical, inferior and anteroseptal scar  . Atrial flutter ([place])   . 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 Cellulitis of leg    both legs  . Chronic diarrhea   . Chronic kidney disease, stage 3, mod decreased GFR    Creatinine of 1.84 in 06/2011 and 1.5 in 07/2011  . Diabetes mellitus    Insulin  . GERD (gastroesophageal reflux disease)   . Gout   . Hyperlipidemia   . Hypertension   . Injection site reaction   . Multiple myeloma 07/01/2011  . Myocardial infarction ([place]) 2000  . Obesity   . Pedal edema    Venous insufficiency  . Sleep apnea    uses cpap  . Ulcer     Past Surgical History:  Procedure Laterality Date  . ABSCESS DRAINAGE     Scrotal  . BIOPSY  01/02/2012   Procedure: BIOPSY;  Surgeon: Rogene Houston, MD;  [facility];  Service: Endoscopy;  Laterality: N/A;  . BONE MARROW BIOPSY  05/13/11  . CARDIAC CATHETERIZATION     cardiac stent  . CARDIOVERSION N/A 10/13/2012   Procedure: CARDIOVERSION;  Surgeon: Yehuda Savannah, MD;  Location: [facility] ORS;  Service: Cardiovascular;  Laterality: N/A;  . CATARACT EXTRACTION W/PHACO Left 02/13/2014   Procedure: CATARACT EXTRACTION PHACO AND INTRAOCULAR LENS PLACEMENT ([place]);  Surgeon: Tonny Branch, MD;  Location: [facility] ORS;  Service: Ophthalmology;  Laterality: Left;  CDE:  7.67  . CATARACT  EXTRACTION W/PHACO Right 03/02/2014   Procedure: CATARACT EXTRACTION PHACO AND INTRAOCULAR LENS PLACEMENT RIGHT EYE CDE=16.81;  Surgeon: Tonny Branch, MD;  Location: [facility] ORS;  Service: Ophthalmology;  Laterality: Right;  . COLONOSCOPY  11/28/2011   Procedure: COLONOSCOPY;  Surgeon: Rogene Houston, MD;  Location[facility];  Service: Endoscopy;  Laterality: N/A;  930  . ESOPHAGOGASTRODUODENOSCOPY  01/02/2012   Procedure: ESOPHAGOGASTRODUODENOSCOPY (EGD);  Surgeon: Rogene Houston, MD;  [facility];  Service: Endoscopy;  Laterality: N/A;  100  .  ESOPHAGOGASTRODUODENOSCOPY N/A 09/20/2012   Procedure: ESOPHAGOGASTRODUODENOSCOPY (EGD);  Surgeon: Rogene Houston, MD;  [facility];  Service: Endoscopy;  Laterality: N/A;  . EUS N/A 10/07/2012   Procedure: UPPER ENDOSCOPIC ULTRASOUND (EUS) LINEAR;  Surgeon: Milus Banister, MD;  Location: [facility];  Service: Endoscopy;  Laterality: N/A;  . INCISION AND DRAINAGE ABSCESS ANAL    . LAPAROSCOPIC GASTRIC BANDING  2006   has been removed  . PORT-A-CATH REMOVAL Left 12/07/2012   Procedure: REMOVAL PORT-A-CATH;  Surgeon: Scherry Ran, MD;  Location: [facility] ORS;  Service: General;  Laterality: Left;  . PORTACATH PLACEMENT  07/07/2011   Procedure: INSERTION PORT-A-CATH;  Surgeon: Scherry Ran, MD;  Location: [facility] ORS;  Service: General;  Laterality: N/A;  . PORTACATH PLACEMENT N/A 12/07/2012   Procedure: INSERTION PORT-A-CATH;  Surgeon: Scherry Ran, MD;  Location: [facility];  Service: General;  Laterality: N/A;  Attempted portacath placement on left and right side  . WRIST SURGERY     Left; removal of bone fragment    Family History  Problem Relation Age of Onset  . Heart disease Mother   . Cancer Mother   . Diabetes Father   . Arthritis    . Anesthesia problems Neg Hx   . Hypotension Neg Hx   . Malignant hyperthermia Neg Hx   . Pseudochol deficiency Neg Hx     Social History   Social History  . Marital status: Married    Spouse name: N/A  . Number of children: N/A  . Years of education: 109   Occupational History  . Nurse, learning disability   . [employer]   Social History Main Topics  . Smoking status: Former Smoker    Packs/day: 0.25    Years: 1.00    Types: Cigarettes, Cigars    Quit date: 05/17/2001  . Smokeless tobacco: Never Used  . Alcohol use No  . Drug use: No  . Sexual activity: Yes    Birth control/ protection: None   Other Topics Concern  . None   Social History Narrative  . None     PHYSICAL EXAMINATION  ECOG  PERFORMANCE STATUS: 1 - Symptomatic but completely ambulatory  Vitals:   01/10/16 1400  BP: (!) 109/50  Pulse: (!) 56  Resp: 18  Temp: 98.8 F (37.1 C)    GENERAL:alert, no distress, well nourished, well developed, comfortable, cooperative, obese, smiling and unaccompanied SKIN: skin color, texture, turgor are normal, no rashes or significant lesions HEAD: Normocephalic, No masses, lesions, tenderness or abnormalities EYES: normal, Conjunctiva are pink and non-injected EARS: External ears normal OROPHARYNX:lips, buccal mucosa, and tongue normal and mucous membranes are moist  NECK: supple, trachea midline LYMPH:  no palpable lymphadenopathy BREAST:not examined LUNGS: clear to auscultation and percussion HEART: regular rate & rhythm, no murmurs, no gallops, S1 normal and S2 normal ABDOMEN:abdomen soft, obese and normal bowel sounds BACK: Back symmetric, no curvature. EXTREMITIES:less then 2  second capillary refill, no joint deformities, effusion, or inflammation, no skin discoloration, no cyanosis, positive findings:  Left foot is in a walking boot  NEURO: alert & oriented x 3 with fluent speech, no focal motor/sensory deficits, gait normal with a cane.   LABORATORY DATA: CBC    Component Value Date/Time   WBC 7.9 01/10/2016 1500   RBC 3.35 (L) 01/10/2016 1500   HGB 10.7 (L) 01/10/2016 1500   HCT 32.0 (L) 01/10/2016 1500   PLT 141 (L) 01/10/2016 1500   MCV 95.5 01/10/2016 1500   MCH 31.9 01/10/2016 1500   MCHC 33.4 01/10/2016 1500   RDW 15.6 (H) 01/10/2016 1500   LYMPHSABS 2.4 01/10/2016 1500   MONOABS 0.9 01/10/2016 1500   EOSABS 0.2 01/10/2016 1500   BASOSABS 0.0 01/10/2016 1500      Chemistry      Component Value Date/Time   NA 140 01/10/2016 1500   K 3.8 01/10/2016 1500   CL 112 (H) 01/10/2016 1500   CO2 23 01/10/2016 1500   BUN 35 (H) 01/10/2016 1500   CREATININE 1.83 (H) 01/10/2016 1500   CREATININE 2.24 (H) 10/05/2012 1411      Component Value Date/Time     CALCIUM 8.4 (L) 01/10/2016 1500   CALCIUM 8.8 03/08/2015 1410   ALKPHOS 107 01/10/2016 1500   AST 66 (H) 01/10/2016 1500   ALT 91 (H) 01/10/2016 1500   BILITOT 0.4 01/10/2016 1500        PENDING LABS:   RADIOGRAPHIC STUDIES:  No results found.   PATHOLOGY:    ASSESSMENT AND PLAN:  Multiple myeloma Stage II, multiple myeloma (kappa light chain specificity) with no evidence of serum m-protein; S/P [facility] evaluate for transplant and not felt to be a candidate.  Bone marrow aspiration and biopsy at the time of diagnosis demonstrated 19% plasma cells and urine SPEP demonstrated 3 g of bence-jones proteins.  Bone marrow aspiration and biopsy on 09/28/2014 showed variably cellular marrow with trilineage hematopoiesis and 2% plasma cells. IHC confirmed the presence of a minor plasma cell component which appears to show polyclonal staining pattern for Kapp and lambda light chain. Latter appearance is nonspecific and not diagnostic of a plasma cell neoplasm. Background shows trilineage hematopoiesis with nonspecific changes.  He continues on Lenalidomide 5 mg 7 days on and 7 days off with 20 mg Dexamethasone BID every Friday only.  Last zometa 09/07/2014  Labs are drawn today with port flush: CBC diff, CMET, SPEP+IFE, and light chain assay.  I personally reviewed and went over laboratory results with the patient.  The results are noted within this dictation.    He continues to follow with his podiatrist, Dr. Caprice Beaver who currently has [name] in a left foot walking shoe.  Labs monthly: CBC diff, CMET, SPEP+IFE, and light chain assay.  He will continue on lenalidomide 5 mg 7 days on and 7 days off with 20 mg Dexamethasone BID every Friday only.  Last zometa was 09/07/2014 due to concerns regarding potential osteonecrosis of jaw.  Return in 8 weeks for follow-up.  I think if he is well in 2 months, we can consider spacing out appointments to every 3 months.   ORDERS PLACED FOR THIS ENCOUNTER: No  orders of the defined types were placed in this encounter.   MEDICATIONS PRESCRIBED THIS ENCOUNTER: No orders of the defined types were placed in this encounter.   THERAPY PLAN:  Continue maintenance therapy as planned.    All questions were answered. The patient knows  to call the clinic with any problems, questions or concerns. We can certainly see the patient much sooner if necessary.  Patient and plan discussed with Dr. Ancil Linsey and she is in agreement with the aforementioned.   This note is electronically signed by: Doy Mince 01/10/2016 7:29 PM

## 2016-01-11 LAB — IGG, IGA, IGM
IgA: 56 mg/dL — ABNORMAL LOW (ref 90–386)
IgG (Immunoglobin G), Serum: 273 mg/dL — ABNORMAL LOW (ref 700–1600)
IgM, Serum: 39 mg/dL (ref 20–172)

## 2016-01-11 LAB — KAPPA/LAMBDA LIGHT CHAINS
Kappa free light chain: 22.2 mg/L — ABNORMAL HIGH (ref 3.3–19.4)
Kappa, lambda light chain ratio: 1.11 (ref 0.26–1.65)
Lambda free light chains: 20 mg/L (ref 5.7–26.3)

## 2016-01-14 LAB — PROTEIN ELECTROPHORESIS, SERUM
A/G Ratio: 1.6 (ref 0.7–1.7)
Albumin ELP: 3 g/dL (ref 2.9–4.4)
Alpha-1-Globulin: 0.2 g/dL (ref 0.0–0.4)
Alpha-2-Globulin: 0.6 g/dL (ref 0.4–1.0)
Beta Globulin: 0.8 g/dL (ref 0.7–1.3)
Gamma Globulin: 0.2 g/dL — ABNORMAL LOW (ref 0.4–1.8)
Globulin, Total: 1.9 g/dL — ABNORMAL LOW (ref 2.2–3.9)
Total Protein ELP: 4.9 g/dL — ABNORMAL LOW (ref 6.0–8.5)

## 2016-01-16 LAB — IMMUNOFIXATION ELECTROPHORESIS
IgA: 56 mg/dL — ABNORMAL LOW (ref 90–386)
IgG (Immunoglobin G), Serum: 273 mg/dL — ABNORMAL LOW (ref 700–1600)
IgM, Serum: 39 mg/dL (ref 20–172)
Total Protein ELP: 4.9 g/dL — ABNORMAL LOW (ref 6.0–8.5)

## 2016-01-18 ENCOUNTER — Other Ambulatory Visit (HOSPITAL_COMMUNITY): Payer: Self-pay | Admitting: Oncology

## 2016-01-18 DIAGNOSIS — C9001 Multiple myeloma in remission: Secondary | ICD-10-CM

## 2016-01-18 MED ORDER — LENALIDOMIDE 5 MG PO CAPS: 5.0000 mg | ORAL_CAPSULE | Freq: Every day | ORAL | 0 refills | Status: DC

## 2016-01-26 ENCOUNTER — Other Ambulatory Visit (INDEPENDENT_AMBULATORY_CARE_PROVIDER_SITE_OTHER): Payer: Self-pay | Admitting: Internal Medicine

## 2016-01-28 ENCOUNTER — Other Ambulatory Visit (INDEPENDENT_AMBULATORY_CARE_PROVIDER_SITE_OTHER): Payer: Self-pay | Admitting: Internal Medicine

## 2016-02-03 ENCOUNTER — Other Ambulatory Visit (HOSPITAL_COMMUNITY): Payer: Self-pay | Admitting: Oncology

## 2016-02-07 ENCOUNTER — Encounter (HOSPITAL_COMMUNITY): Payer: Managed Care, Other (non HMO) | Attending: Oncology

## 2016-02-07 ENCOUNTER — Encounter (HOSPITAL_COMMUNITY): Payer: Self-pay

## 2016-02-07 DIAGNOSIS — C9000 Multiple myeloma not having achieved remission: Secondary | ICD-10-CM

## 2016-02-07 DIAGNOSIS — Z452 Encounter for adjustment and management of vascular access device: Secondary | ICD-10-CM | POA: Diagnosis not present

## 2016-02-07 DIAGNOSIS — C9001 Multiple myeloma in remission: Secondary | ICD-10-CM

## 2016-02-07 LAB — CBC WITH DIFFERENTIAL/PLATELET
Basophils Absolute: 0 10*3/uL (ref 0.0–0.1)
Basophils Relative: 0 %
Eosinophils Absolute: 0.2 10*3/uL (ref 0.0–0.7)
Eosinophils Relative: 2 %
HCT: 32 % — ABNORMAL LOW (ref 39.0–52.0)
Hemoglobin: 10.4 g/dL — ABNORMAL LOW (ref 13.0–17.0)
Lymphocytes Relative: 28 %
Lymphs Abs: 2.1 10*3/uL (ref 0.7–4.0)
MCH: 31.1 pg (ref 26.0–34.0)
MCHC: 32.5 g/dL (ref 30.0–36.0)
MCV: 95.8 fL (ref 78.0–100.0)
Monocytes Absolute: 1 10*3/uL (ref 0.1–1.0)
Monocytes Relative: 13 %
Neutro Abs: 4.2 10*3/uL (ref 1.7–7.7)
Neutrophils Relative %: 57 %
Platelets: 132 10*3/uL — ABNORMAL LOW (ref 150–400)
RBC: 3.34 MIL/uL — ABNORMAL LOW (ref 4.22–5.81)
RDW: 15.5 % (ref 11.5–15.5)
WBC: 7.4 10*3/uL (ref 4.0–10.5)

## 2016-02-07 LAB — COMPREHENSIVE METABOLIC PANEL
ALT: 93 U/L — ABNORMAL HIGH (ref 17–63)
AST: 81 U/L — ABNORMAL HIGH (ref 15–41)
Albumin: 3.1 g/dL — ABNORMAL LOW (ref 3.5–5.0)
Alkaline Phosphatase: 101 U/L (ref 38–126)
Anion gap: 10 (ref 5–15)
BUN: 33 mg/dL — ABNORMAL HIGH (ref 6–20)
CO2: 20 mmol/L — ABNORMAL LOW (ref 22–32)
Calcium: 8.5 mg/dL — ABNORMAL LOW (ref 8.9–10.3)
Chloride: 109 mmol/L (ref 101–111)
Creatinine, Ser: 1.86 mg/dL — ABNORMAL HIGH (ref 0.61–1.24)
GFR calc Af Amer: 45 mL/min — ABNORMAL LOW (ref >60)
GFR calc non Af Amer: 39 mL/min — ABNORMAL LOW (ref >60)
Glucose, Bld: 186 mg/dL — ABNORMAL HIGH (ref 65–99)
Potassium: 3.7 mmol/L (ref 3.5–5.1)
Sodium: 139 mmol/L (ref 135–145)
Total Bilirubin: 0.5 mg/dL (ref 0.3–1.2)
Total Protein: 5.3 g/dL — ABNORMAL LOW (ref 6.5–8.1)

## 2016-02-07 MED ORDER — SODIUM CHLORIDE 0.9% FLUSH
10.0000 mL | INTRAVENOUS | Status: DC | PRN
  Administered 2016-02-07: 10 mL via INTRAVENOUS
  Filled 2016-02-07: qty 10

## 2016-02-07 MED ORDER — HEPARIN SOD (PORK) LOCK FLUSH 100 UNIT/ML IV SOLN
500.0000 [IU] | Freq: Once | INTRAVENOUS | Status: AC
  Administered 2016-02-07: 500 [IU] via INTRAVENOUS

## 2016-02-07 NOTE — Progress Notes (Signed)
[name] presented for Portacath access and flush.  Portacath located right chest wall accessed with  H 20 needle.  Good blood return present. Portacath flushed with 75ml NS and 500U/6ml Heparin and needle removed intact.  Procedure tolerated well and without incident.

## 2016-02-07 NOTE — Patient Instructions (Signed)
[place] at [facility] Discharge Instructions  RECOMMENDATIONS MADE BY THE CONSULTANT AND ANY TEST RESULTS WILL BE SENT TO YOUR REFERRING PHYSICIAN.  Port flush with lab work today. Return as scheduled for office visit and port flush.    Thank you for choosing [place] at [facility] to provide your oncology and hematology care.  To afford each patient quality time with our provider, please arrive at least 15 minutes before your scheduled appointment time.   Beginning January 23rd 2017 lab work for the [facility] will be done in the  Main lab at [facility] on 1st floor. If you have a lab appointment with the [facility] please come in thru the  Main Entrance and check in at the main information desk  You need to re-schedule your appointment should you arrive 10 or more minutes late.  We strive to give you quality time with our providers, and arriving late affects you and other patients whose appointments are after yours.  Also, if you no show three or more times for appointments you may be dismissed from the clinic at the providers discretion.     Again, thank you for choosing [facility].  Our hope is that these requests will decrease the amount of time that you wait before being seen by our physicians.       _____________________________________________________________  Should you have questions after your visit to [facility], please contact our office at [phone] between the hours of 8:30 a.m. and 4:30 p.m.  Voicemails left after 4:30 p.m. will not be returned until the following business day.  For prescription refill requests, have your pharmacy contact our office.         Resources For Cancer Patients and their Caregivers ? American Cancer Society: Can assist with transportation, wigs, general needs, runs Look Good Feel Better.        475-100-1975 ? Cancer Care: Provides financial  assistance, online support groups, medication/co-pay assistance.  1-800-813-HOPE 804-457-3725) ? [facility] Assists [place] cancer patients and their families through emotional , educational and financial support.  (856)472-6161 ? [place] DSS Where to apply for food stamps, Medicaid and utility assistance. 862-798-4257 ? [facility]: Transportation to medical appointments. (952)730-2596 ? Social Security Administration: May apply for disability if have a Stage IV cancer. 463-643-3467 531-632-1500 ? [employer], Disability and Transit Services: Assists with nutrition, care and transit needs. [place] Support Programs: @10RELATIVEDAYS @ > Cancer Support Group  2nd Tuesday of the month 1pm-2pm, Journey Room  > Creative Journey  3rd Tuesday of the month 1130am-1pm, Journey Room  > Look Good Feel Better  1st Wednesday of the month 10am-12 noon, Journey Room (Call [facility] to register [phone])

## 2016-02-08 LAB — IGG, IGA, IGM
IgA: 50 mg/dL — ABNORMAL LOW (ref 90–386)
IgG (Immunoglobin G), Serum: 256 mg/dL — ABNORMAL LOW (ref 700–1600)
IgM, Serum: 36 mg/dL (ref 20–172)

## 2016-02-08 LAB — KAPPA/LAMBDA LIGHT CHAINS
Kappa free light chain: 21.9 mg/L — ABNORMAL HIGH (ref 3.3–19.4)
Kappa, lambda light chain ratio: 1.01 (ref 0.26–1.65)
Lambda free light chains: 21.7 mg/L (ref 5.7–26.3)

## 2016-02-08 LAB — VITAMIN D 25 HYDROXY (VIT D DEFICIENCY, FRACTURES): Vit D, 25-Hydroxy: 27.4 ng/mL — ABNORMAL LOW (ref 30.0–100.0)

## 2016-02-08 LAB — PTH, INTACT AND CALCIUM
Calcium, Total (PTH): 8.3 mg/dL — ABNORMAL LOW (ref 8.7–10.2)
PTH: 59 pg/mL (ref 15–65)

## 2016-02-12 ENCOUNTER — Other Ambulatory Visit (HOSPITAL_COMMUNITY): Payer: Self-pay

## 2016-02-12 ENCOUNTER — Encounter (HOSPITAL_COMMUNITY): Payer: Managed Care, Other (non HMO)

## 2016-02-12 DIAGNOSIS — C9000 Multiple myeloma not having achieved remission: Secondary | ICD-10-CM | POA: Diagnosis not present

## 2016-02-12 DIAGNOSIS — D649 Anemia, unspecified: Secondary | ICD-10-CM

## 2016-02-12 LAB — PROTEIN ELECTROPHORESIS, SERUM
A/G Ratio: 2 — ABNORMAL HIGH (ref 0.7–1.7)
Albumin ELP: 3.2 g/dL (ref 2.9–4.4)
Alpha-1-Globulin: 0.2 g/dL (ref 0.0–0.4)
Alpha-2-Globulin: 0.6 g/dL (ref 0.4–1.0)
Beta Globulin: 0.7 g/dL (ref 0.7–1.3)
Gamma Globulin: 0.2 g/dL — ABNORMAL LOW (ref 0.4–1.8)
Globulin, Total: 1.6 g/dL — ABNORMAL LOW (ref 2.2–3.9)
Total Protein ELP: 4.8 g/dL — ABNORMAL LOW (ref 6.0–8.5)

## 2016-02-12 LAB — FERRITIN: Ferritin: 102 ng/mL (ref 24–336)

## 2016-02-12 LAB — IRON AND TIBC
Iron: 53 ug/dL (ref 45–182)
Saturation Ratios: 16 % — ABNORMAL LOW (ref 17.9–39.5)
TIBC: 340 ug/dL (ref 250–450)
UIBC: 287 ug/dL

## 2016-02-12 LAB — IMMUNOFIXATION ELECTROPHORESIS: Total Protein ELP: 4.6 g/dL — ABNORMAL LOW (ref 6.0–8.5)

## 2016-02-15 ENCOUNTER — Other Ambulatory Visit (HOSPITAL_COMMUNITY): Payer: Managed Care, Other (non HMO)

## 2016-02-16 ENCOUNTER — Other Ambulatory Visit: Payer: Self-pay | Admitting: Cardiovascular Disease

## 2016-02-19 ENCOUNTER — Other Ambulatory Visit (HOSPITAL_COMMUNITY): Payer: Self-pay | Admitting: Emergency Medicine

## 2016-02-19 DIAGNOSIS — C9001 Multiple myeloma in remission: Secondary | ICD-10-CM

## 2016-02-19 MED ORDER — LENALIDOMIDE 5 MG PO CAPS: 5.0000 mg | ORAL_CAPSULE | Freq: Every day | ORAL | 0 refills | Status: DC

## 2016-02-19 NOTE — Progress Notes (Signed)
revlimid refilled

## 2016-02-26 ENCOUNTER — Ambulatory Visit (INDEPENDENT_AMBULATORY_CARE_PROVIDER_SITE_OTHER): Payer: Managed Care, Other (non HMO) | Admitting: Internal Medicine

## 2016-02-26 ENCOUNTER — Encounter (INDEPENDENT_AMBULATORY_CARE_PROVIDER_SITE_OTHER): Payer: Self-pay | Admitting: Internal Medicine

## 2016-02-26 VITALS — BP 104/74 | HR 76 | Temp 98.3°F | Resp 18 | Ht 72.0 in | Wt 368.7 lb

## 2016-02-26 DIAGNOSIS — K219 Gastro-esophageal reflux disease without esophagitis: Secondary | ICD-10-CM | POA: Diagnosis not present

## 2016-02-26 DIAGNOSIS — K589 Irritable bowel syndrome without diarrhea: Secondary | ICD-10-CM

## 2016-02-26 DIAGNOSIS — K449 Diaphragmatic hernia without obstruction or gangrene: Secondary | ICD-10-CM

## 2016-02-26 DIAGNOSIS — K903 Pancreatic steatorrhea: Secondary | ICD-10-CM

## 2016-02-26 DIAGNOSIS — R7989 Other specified abnormal findings of blood chemistry: Secondary | ICD-10-CM

## 2016-02-26 DIAGNOSIS — R945 Abnormal results of liver function studies: Secondary | ICD-10-CM

## 2016-02-26 MED ORDER — DEXLANSOPRAZOLE 60 MG PO CPDR: 60.0000 mg | DELAYED_RELEASE_CAPSULE | Freq: Every day | ORAL | 11 refills | Status: DC

## 2016-02-26 MED ORDER — CHOLESTYRAMINE 4 G PO PACK: 4.0000 g | PACK | Freq: Two times a day (BID) | ORAL | 0 refills | Status: DC

## 2016-02-26 MED ORDER — DICYCLOMINE HCL 20 MG PO TABS: 20.0000 mg | ORAL_TABLET | Freq: Three times a day (TID) | ORAL | 5 refills | Status: DC

## 2016-02-26 NOTE — Patient Instructions (Signed)
Take Questran or cholestyramine 2 hours before or after taking other medications. Call office with progress report in one month

## 2016-02-26 NOTE — Progress Notes (Signed)
Presenting complaint;  Follow-up for diarrhea and GERD.  Subjective:  [name] is 55 year old African-American male who is here for scheduled visit. He was last seen in the office on 04/23/2015. He remains with diarrhea. He has an average of 4 bowel movements per day. Most of his bowel movements are explosive. He generally has one formed stool per week. He also complains of flatulence. He has not had any pain across upper abdomen recently. He said this pain comes in spurts and usually feels like a gas pain. He denies postprandial nausea or vomiting. He also has nocturnal bowel movement 3-4 times a week. He denies melena or rectal bleeding. He has had occasional accidents. He states heartburn is well controlled with PPI and he needs new prescription. He has good appetite. He has gained 12 pounds since his last visit. He states he is not able to do much exercise on account of nonhealing left foot ulcer. He is under care of Dr. Caprice Beaver.   Current Medications: Outpatient Encounter Prescriptions as of 02/26/2016  Medication Sig  . acyclovir (ZOVIRAX) 400 MG tablet TAKE ONE TABLET BY MOUTH EVERY MORNING.  Marland Kitchen allopurinol (ZYLOPRIM) 300 MG tablet TAKE ONE TABLET BY MOUTH ONCE DAILY.  Marland Kitchen atorvastatin (LIPITOR) 80 MG tablet TAKE ONE TABLET BY MOUTH AT BEDTIME.  . calcitRIOL (ROCALTROL) 0.25 MCG capsule Take 0.25 mcg by mouth 3 (three) times a week. Monday, Wednesday, Friday.  . calcium carbonate (OS-CAL) 600 MG TABS tablet Take 1,800 mg by mouth 2 (two) times daily.   Marland Kitchen CREON 36000 units CPEP capsule TAKE 5 CAPSULES BY MOUTH DAILY WITH MEALS AND 2 CAPSULES WITH SNACKS.  Marland Kitchen dexamethasone (DECADRON) 4 MG tablet TAKE 5 TABLETS BY MOUTH 2 TIMES A DAY EVERY FRIDAY.  Marland Kitchen dexlansoprazole (DEXILANT) 60 MG capsule Take 1 capsule (60 mg total) by mouth daily.  Marland Kitchen dicyclomine (BENTYL) 20 MG tablet TAKE 1 TABLET BY MOUTH EVERY SIX HOURS.  Marland Kitchen diphenoxylate-atropine (LOMOTIL) 2.5-0.025 MG tablet TAKE (2) TABLETS BY MOUTH TWICE  DAILY.  Marland Kitchen HYDROcodone-acetaminophen (NORCO/VICODIN) 5-325 MG per tablet Take 1-2 tablets by mouth every 4 (four) hours as needed for moderate pain or severe pain.  Marland Kitchen insulin detemir (LEVEMIR) 100 UNIT/ML injection Inject 40 Units into the skin every morning.   . insulin lispro (HUMALOG) 100 UNIT/ML injection Inject 5-30 Units into the skin 2 (two) times daily with a meal. Sliding Scale per patient  . lenalidomide ([name]) 5 MG capsule Take 1 capsule (5 mg total) by mouth daily. Take 1 tablet PO 7 days on and 7 days off.  . lisinopril (PRINIVIL,ZESTRIL) 5 MG tablet Take 5 mg by mouth every other day.  . loratadine (CLARITIN) 10 MG tablet Take 10 mg by mouth every morning.   . metoprolol succinate (TOPROL-XL) 25 MG 24 hr tablet TAKE ONE TABLET BY MOUTH ONCE DAILY.  . Multiple Vitamins-Minerals (MULTIVITAMINS THER. W/MINERALS) TABS Take 1 tablet by mouth daily.   . nitroGLYCERIN (NITROSTAT) 0.4 MG SL tablet Place 1 tablet (0.4 mg total) under the tongue every 5 (five) minutes as needed for chest pain.  . potassium chloride SA (K-DUR,KLOR-CON) 20 MEQ tablet TAKE 2 TABLETS BY MOUTH TWICE DAILY.  . sodium bicarbonate 650 MG tablet Take 1,300 mg by mouth 3 (three) times daily.  Marland Kitchen sulfamethoxazole-trimethoprim (BACTRIM DS,SEPTRA DS) 800-160 MG tablet TAKE ONE TABLET BY MOUTH EVERY MONDAY, WEDNESDAY, AND FRIDAY.  Marland Kitchen torsemide (DEMADEX) 20 MG tablet TAKE 2 TABLETS BY MOUTH EACH MORNING AND 1 TABLET EACH EVENING.  . Vitamin D,  Ergocalciferol, (DRISDOL) 50000 units CAPS capsule Take 1 capsule (50,000 Units total) by mouth every 30 (thirty) days.  Alveda Reasons 20 MG TABS tablet TAKE 1 TABLET BY MOUTH DAILY.   Facility-Administered Encounter Medications as of 02/26/2016  Medication  . sodium chloride 0.9 % injection 10 mL     Objective: Blood pressure 104/74, pulse 76, temperature 98.3 F (36.8 C), temperature source Oral, resp. rate 18, height 6' (1.829 m), weight (!) 368 lb 11.2 oz (167.2 kg). Patient is  alert and in no acute distress. Conjunctiva is pink. Sclera is nonicteric Oropharyngeal mucosa is normal. No neck masses or thyromegaly noted. Cardiac exam with regular rhythm normal S1 and S2. No murmur or gallop noted. Lungs are clear to auscultation. Abdomen is obese with normal bowel sounds. On palpation abdomen is soft. He has mild peri-umbilical tenderness. Percussion noticed markedly tympanitic in epigastric region. No organomegaly or masses. He has 2+ pitting edema involving both legs. He is wearing left surgical boot..  Labs/studies Results: Lab data from 02/07/2016  WBC 7.4, H&H 10.4 and 32.0 and platelet count 132K  Bilirubin 0.5, AP 101, AST 81, ALT 93, total protein 5.3 and albumin 3.1  Serum calcium 8.5  Serum sodium 139, potassium 3.7, chloride 109, CO2 20  Glucose 186  BUN 33 and creatinine 1.86.   Multiple abdominopelvic CT films are reviewed; CT from 05/18/2011 CT from 09/19/2012 CT from 02/25/2015.  Assessment:  #1.Pancreatic diarrhea. Patient remains with diarrhea despite modest dose of pancreatic enzyme supplements. He is having formed stools every now and then. He is not losing weight. Therefore significant malabsorption is unlikely. Other causes for diarrhea include medications and irritable bowel syndrome. #2. Irritable bowel syndrome. IBS is on treatment into his diarrhea. He is on dicyclomine with some benefit. #3. GERD. Symptoms are well controlled with PPI which will be continued. #4. Diaphragmatic hernia. Left diaphragmatic hernia. Multiple imaging studies reviewed. Small segment of colon/splenic flexure is up in his left chest. Hernia was not present on CT of 2012 but present on CT of April 2014. He is not having any symptoms. Patient informed of this diagnosis. Not recommend surgery given that he is high risk. However if he develops acute pain he will need to be immediately seen in emergency room. #5. Elevated transaminases. Patient has had mildly elevated  transaminases for several months felt to be due to fatty liver. Markers for hepatitis B and C were negative and ferritin levels are not consistent with hemochromatosis. #6. Asymptomatic cholelithiasis. He does not have symptoms of cholelithiasis. Some of his pain appears to be due to gas in stomach.  Has multiple other problems which include morbid obesity chronic kidney disease chronic disease anemia and multiple myeloma in remission. He is on maintenance therapy.  Plan:  Patient can take simethicone or Gas-X on as-needed basis for flatulence. New prescription given for dicyclomine 20 mg before each meal. New prescription given for Dexilant extremely milligrams by mouth every morning. Cholestyramine 4 g by mouth twice a day. Patient advised to take this medication 2 hours before or after taking other medications. Patient will call office with progress report in one month. Patient informed that he has left sided diaphragmatic hernia and if he has acute pain in left upper quadrant or left side of chest he should be evaluated in the emergency room. Office visit in 4 months.

## 2016-03-01 ENCOUNTER — Other Ambulatory Visit (HOSPITAL_COMMUNITY): Payer: Self-pay | Admitting: Oncology

## 2016-03-01 ENCOUNTER — Other Ambulatory Visit: Payer: Self-pay | Admitting: Internal Medicine

## 2016-03-03 ENCOUNTER — Other Ambulatory Visit: Payer: Self-pay | Admitting: Cardiovascular Disease

## 2016-03-03 MED ORDER — RIVAROXABAN 20 MG PO TABS: 20.0000 mg | ORAL_TABLET | Freq: Every day | ORAL | 3 refills | Status: DC

## 2016-03-03 NOTE — Telephone Encounter (Signed)
° ° °  1. Which medications need to be refilled? (please list name of each medication and dose if known)  XARELTO 20 MG TABS tablet [[id number]]    2. Which pharmacy/location (including street and city if local pharmacy) is medication to be sent to?  [place]  3. Do they need a 30 day or 90 day supply?   Pt is scheduled to see KL next Tuesday 03/11/16

## 2016-03-03 NOTE — Telephone Encounter (Signed)
xarelto refilled.

## 2016-03-06 ENCOUNTER — Encounter (HOSPITAL_COMMUNITY): Payer: Managed Care, Other (non HMO) | Attending: Oncology | Admitting: Hematology & Oncology

## 2016-03-06 ENCOUNTER — Ambulatory Visit (HOSPITAL_COMMUNITY)
Admission: RE | Admit: 2016-03-06 | Discharge: 2016-03-06 | Disposition: A | Discharge status: Home / Self Care | Payer: Managed Care, Other (non HMO) | Source: Ambulatory Visit | Attending: Hematology & Oncology | Admitting: Hematology & Oncology

## 2016-03-06 ENCOUNTER — Encounter (HOSPITAL_BASED_OUTPATIENT_CLINIC_OR_DEPARTMENT_OTHER): Payer: Managed Care, Other (non HMO)

## 2016-03-06 VITALS — BP 126/76 | HR 48 | Temp 97.8°F | Resp 20 | Wt 372.6 lb

## 2016-03-06 DIAGNOSIS — K8020 Calculus of gallbladder without cholecystitis without obstruction: Secondary | ICD-10-CM | POA: Insufficient documentation

## 2016-03-06 DIAGNOSIS — Z23 Encounter for immunization: Secondary | ICD-10-CM | POA: Diagnosis not present

## 2016-03-06 DIAGNOSIS — N183 Chronic kidney disease, stage 3 (moderate): Secondary | ICD-10-CM

## 2016-03-06 DIAGNOSIS — C9000 Multiple myeloma not having achieved remission: Secondary | ICD-10-CM | POA: Insufficient documentation

## 2016-03-06 DIAGNOSIS — D649 Anemia, unspecified: Secondary | ICD-10-CM

## 2016-03-06 DIAGNOSIS — M545 Low back pain, unspecified: Secondary | ICD-10-CM

## 2016-03-06 DIAGNOSIS — K591 Functional diarrhea: Secondary | ICD-10-CM

## 2016-03-06 DIAGNOSIS — D801 Nonfamilial hypogammaglobulinemia: Secondary | ICD-10-CM | POA: Diagnosis not present

## 2016-03-06 DIAGNOSIS — E876 Hypokalemia: Secondary | ICD-10-CM

## 2016-03-06 DIAGNOSIS — Z95828 Presence of other vascular implants and grafts: Secondary | ICD-10-CM

## 2016-03-06 DIAGNOSIS — I708 Atherosclerosis of other arteries: Secondary | ICD-10-CM | POA: Insufficient documentation

## 2016-03-06 DIAGNOSIS — I5022 Chronic systolic (congestive) heart failure: Secondary | ICD-10-CM

## 2016-03-06 DIAGNOSIS — C9001 Multiple myeloma in remission: Secondary | ICD-10-CM | POA: Diagnosis not present

## 2016-03-06 DIAGNOSIS — M47896 Other spondylosis, lumbar region: Secondary | ICD-10-CM | POA: Diagnosis not present

## 2016-03-06 DIAGNOSIS — M5136 Other intervertebral disc degeneration, lumbar region: Secondary | ICD-10-CM | POA: Diagnosis not present

## 2016-03-06 DIAGNOSIS — I700 Atherosclerosis of aorta: Secondary | ICD-10-CM | POA: Diagnosis not present

## 2016-03-06 LAB — CBC WITH DIFFERENTIAL/PLATELET
Basophils Absolute: 0 10*3/uL (ref 0.0–0.1)
Basophils Relative: 0 %
Eosinophils Absolute: 0.3 10*3/uL (ref 0.0–0.7)
Eosinophils Relative: 3 %
HCT: 31.6 % — ABNORMAL LOW (ref 39.0–52.0)
Hemoglobin: 10.3 g/dL — ABNORMAL LOW (ref 13.0–17.0)
Lymphocytes Relative: 29 %
Lymphs Abs: 2.4 10*3/uL (ref 0.7–4.0)
MCH: 31.1 pg (ref 26.0–34.0)
MCHC: 32.6 g/dL (ref 30.0–36.0)
MCV: 95.5 fL (ref 78.0–100.0)
Monocytes Absolute: 1.2 10*3/uL — ABNORMAL HIGH (ref 0.1–1.0)
Monocytes Relative: 15 %
Neutro Abs: 4.2 10*3/uL (ref 1.7–7.7)
Neutrophils Relative %: 53 %
Platelets: 137 10*3/uL — ABNORMAL LOW (ref 150–400)
RBC: 3.31 MIL/uL — ABNORMAL LOW (ref 4.22–5.81)
RDW: 15.6 % — ABNORMAL HIGH (ref 11.5–15.5)
WBC: 8 10*3/uL (ref 4.0–10.5)

## 2016-03-06 LAB — COMPREHENSIVE METABOLIC PANEL
ALT: 87 U/L — ABNORMAL HIGH (ref 17–63)
AST: 79 U/L — ABNORMAL HIGH (ref 15–41)
Albumin: 3.1 g/dL — ABNORMAL LOW (ref 3.5–5.0)
Alkaline Phosphatase: 83 U/L (ref 38–126)
Anion gap: 5 (ref 5–15)
BUN: 30 mg/dL — ABNORMAL HIGH (ref 6–20)
CO2: 22 mmol/L (ref 22–32)
Calcium: 8.3 mg/dL — ABNORMAL LOW (ref 8.9–10.3)
Chloride: 113 mmol/L — ABNORMAL HIGH (ref 101–111)
Creatinine, Ser: 1.56 mg/dL — ABNORMAL HIGH (ref 0.61–1.24)
GFR calc Af Amer: 55 mL/min — ABNORMAL LOW (ref >60)
GFR calc non Af Amer: 48 mL/min — ABNORMAL LOW (ref >60)
Glucose, Bld: 112 mg/dL — ABNORMAL HIGH (ref 65–99)
Potassium: 3.1 mmol/L — ABNORMAL LOW (ref 3.5–5.1)
Sodium: 140 mmol/L (ref 135–145)
Total Bilirubin: 0.8 mg/dL (ref 0.3–1.2)
Total Protein: 5.4 g/dL — ABNORMAL LOW (ref 6.5–8.1)

## 2016-03-06 MED ORDER — HEPARIN SOD (PORK) LOCK FLUSH 100 UNIT/ML IV SOLN
500.0000 [IU] | Freq: Once | INTRAVENOUS | Status: AC
  Administered 2016-03-06: 500 [IU] via INTRAVENOUS

## 2016-03-06 MED ORDER — INFLUENZA VAC SPLIT QUAD 0.5 ML IM SUSY
0.5000 mL | PREFILLED_SYRINGE | Freq: Once | INTRAMUSCULAR | Status: AC
  Administered 2016-03-06: 0.5 mL via INTRAMUSCULAR

## 2016-03-06 MED ORDER — PNEUMOCOCCAL 13-VAL CONJ VACC IM SUSP
0.5000 mL | Freq: Once | INTRAMUSCULAR | Status: AC
  Administered 2016-03-06: 0.5 mL via INTRAMUSCULAR
  Filled 2016-03-06: qty 0.5

## 2016-03-06 MED ORDER — SODIUM CHLORIDE 0.9% FLUSH
10.0000 mL | INTRAVENOUS | Status: DC | PRN
  Administered 2016-03-06: 10 mL via INTRAVENOUS
  Filled 2016-03-06: qty 10

## 2016-03-06 MED ORDER — INFLUENZA VAC SPLIT QUAD 0.5 ML IM SUSY
PREFILLED_SYRINGE | INTRAMUSCULAR | Status: AC
  Filled 2016-03-06: qty 0.5

## 2016-03-06 MED ORDER — HEPARIN SOD (PORK) LOCK FLUSH 100 UNIT/ML IV SOLN
INTRAVENOUS | Status: AC
  Filled 2016-03-06: qty 5

## 2016-03-06 NOTE — Progress Notes (Signed)
[name] presents today for injection per the provider's orders.  Prevnar 13 administration without incident; see MAR for injection details.  Patient tolerated procedure well and without incident.  No questions or complaints noted at this time.

## 2016-03-06 NOTE — Patient Instructions (Signed)
[place] at [facility] Discharge Instructions  RECOMMENDATIONS MADE BY THE CONSULTANT AND ANY TEST RESULTS WILL BE SENT TO YOUR REFERRING PHYSICIAN.  Flu vaccine as well as pneumonia vaccine today.   Return to clinic for follow up in 2 months.   Labs every 6 weeks as ordered.   Go to radiology today as you leave for plain films of your back.    Thank you for choosing [place] at [facility] to provide your oncology and hematology care.  To afford each patient quality time with our provider, please arrive at least 15 minutes before your scheduled appointment time.   Beginning January 23rd 2017 lab work for the [facility] will be done in the  Main lab at [facility] on 1st floor. If you have a lab appointment with the [facility] please come in thru the  Main Entrance and check in at the main information desk  You need to re-schedule your appointment should you arrive 10 or more minutes late.  We strive to give you quality time with our providers, and arriving late affects you and other patients whose appointments are after yours.  Also, if you no show three or more times for appointments you may be dismissed from the clinic at the providers discretion.     Again, thank you for choosing [facility]  Our hope is that these requests will decrease the amount of time that you wait before being seen by our physicians.       _____________________________________________________________  Should you have questions after your visit to [facility], please contact our office at [phone] between the hours of 8:30 a.m. and 4:30 p.m.  Voicemails left after 4:30 p.m. will not be returned until the following business day.  For prescription refill requests, have your pharmacy contact our office.         Resources For Cancer Patients and their Caregivers ? American Cancer Society: Can assist with transportation, wigs,  general needs, runs Look Good Feel Better.        619-127-1130 ? Cancer Care: Provides financial assistance, online support groups, medication/co-pay assistance.  1-800-813-HOPE 361-627-3435) ? [place] Assists [place] cancer patients and their families through emotional , educational and financial support.  (979)590-2852 ? [place] DSS Where to apply for food stamps, Medicaid and utility assistance. (925)859-8281 ? [facility]: Transportation to medical appointments. 262 420 6273 ? Social Security Administration: May apply for disability if have a Stage IV cancer. 7147924277 228-192-0415 ? [employer], Disability and Transit Services: Assists with nutrition, care and transit needs. [place] Support Programs: @10RELATIVEDAYS @ > Cancer Support Group  2nd Tuesday of the month 1pm-2pm, Journey Room  > Creative Journey  3rd Tuesday of the month 1130am-1pm, Journey Room  > Look Good Feel Better  1st Wednesday of the month 10am-12 noon, Journey Room (Call [facility] to register [phone])

## 2016-03-06 NOTE — Progress Notes (Signed)
[name], MD [address] [place] [address]   DIAGNOSIS:  Myeloma, kappa light chain, Stage II disease (no evidence of monoclonal protein in serum) Evaluation at [facility] for transplant, felt not to be a good transplant candidate Stage III CKD 3 g proteinuria at diagnosis, Bence-Jones proteinuria BMBX at diagnosis with 19% plasma cells, kappa restricted  BMBX 09/28/2014 showing variably cellular marrow with trilineage hematopoiesis and 2% plasma cells. IHC confirms the presence of a minor plasma cell component which appears to show polyclonal staining pattern per And lambda light chain. Latter appearance is nonspecific and not diagnostic of a plasma cell neoplasm. Background shows trilineage hematopoiesis with nonspecific changes.  CT maxillofacial without contrast 12/05/2014 with mild by asymmetric sclerosis in the R mandibular ramus and adjacent body compared tot the left, without sequestrum or fracture. Could be a manifestation of very early or mild AVN.  CURRENT THERAPY: Lenalidomide 5 mg 7 days on and 7 days off with 20 mg Dexamethasone BID every Friday only.  Last zometa 09/07/2014  INTERVAL HISTORY: [name] 55 y.o. male returns for follow-up of myeloma, on peripheral studies in remission. Last BMBX in 09/2014.  He wishes to continue on revlimid, mother died from myeloma.  Patient is unaccompanied and is having his port flushed today.   He reports pain in the center of his back. Chriss says he has not fallen or injured himself recently. Back pain began over the weekend. He says that, although he can lay flat on his back, it is painful to turn or move around. However, if he stays in one position, back pain subsides.   [name] has started a new powder medication three days ago, prescribed by Dr. Laural Golden, for his diarrhea. He has been feeling fatigued which he believes is due to his diarrhea. He feels the powder is only aggravating his symptoms.   Patient  denies numbness or tingling. He says he can button his shirt normally.   [name] denies any issues with [name]. His appetite is normal and he is generally feeling well. His hand cramping has improved since his last visit and he rubs cream on it to manage soreness.   Patient has a boot on his left foot. He says issues with his foot are intermittent: one week it is healed and he tries to wear a shoe and then the next week he is back in a boot. He sees a podiatrist, Dr. Baldwin Crown. He is scheduled to see him in two weeks.  Patient has a cardiologist appointment next week    He does not need refills for any medication.  No other major complaints or concerns. No fevers. Sleeps well.    MEDICAL HISTORY: Past Medical History:  Diagnosis Date  . Arteriosclerotic cardiovascular disease (ASCVD)    MI-2000s; stent to the proximal LAD and diagonal in 2001; stress nuclear in 2008-impaired exercise capacity, left ventricular dilatation, moderately to severely depressed EF, apical, inferior and anteroseptal scar  . Atrial flutter ([place])   . 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 Cellulitis of leg    both legs  . Chronic diarrhea   . Chronic kidney disease, stage 3, mod decreased GFR    Creatinine of 1.84 in 06/2011 and 1.5 in 07/2011  . Diabetes mellitus    Insulin  . GERD (gastroesophageal reflux disease)   . Gout   . Hyperlipidemia   . Hypertension   . Injection site reaction   . Multiple myeloma 07/01/2011  .  Myocardial infarction 2000  . Obesity   . Pedal edema    Venous insufficiency  . Sleep apnea    uses cpap  . Ulcer ([place])     has DIABETES MELLITUS, TYPE II, ON INSULIN; Morbid obesity ([place]); SLEEP APNEA; Multiple myeloma ([place]); Arteriosclerotic cardiovascular disease (ASCVD); Hypertension; Hyperlipidemia; DDD (degenerative disc disease), cervical; Atrial flutter ([place]); Chronic pancreatitis ([place]); Anemia, normocytic normochromic; Chronic systolic heart failure (HCC); and Diaphragmatic  hernia without obstruction on his problem list.     has No Known Allergies.  [name] had no medications administered during this visit.  SURGICAL HISTORY: Past Surgical History:  Procedure Laterality Date  . ABSCESS DRAINAGE     Scrotal  . BIOPSY  01/02/2012   Procedure: BIOPSY;  Surgeon: Rogene Houston, MD;  [facility];  Service: Endoscopy;  Laterality: N/A;  . BONE MARROW BIOPSY  05/13/11  . CARDIAC CATHETERIZATION     cardiac stent  . CARDIOVERSION N/A 10/13/2012   Procedure: CARDIOVERSION;  Surgeon: Yehuda Savannah, MD;  Location: [facility] ORS;  Service: Cardiovascular;  Laterality: N/A;  . CATARACT EXTRACTION W/PHACO Left 02/13/2014   Procedure: CATARACT EXTRACTION PHACO AND INTRAOCULAR LENS PLACEMENT ([place]);  Surgeon: Tonny Branch, MD;  Location: [facility] OR[facility];  Service: Ophthalmology;  Laterality: Left;  CDE:  7.67  . CATARACT EXTRACTION W/PHACO Right 03/02/2014   Procedure: CATARACT EXTRACTION PHACO AND INTRAOCULAR LENS PLACEMENT RIGHT EYE CDE=16.81;  Surgeon: Tonny Branch, MD;  Location: [facility];  Service: Ophthalmology;  Laterality: Right;  . COLONOSCOPY  11/28/2011   Procedure: COLONOSCOPY;  Surgeon: Rogene Houston, MD;  [facility];  Service: Endoscopy;  Laterality: N/A;  930  . ESOPHAGOGASTRODUODENOSCOPY  01/02/2012   Procedure: ESOPHAGOGASTRODUODENOSCOPY (EGD);  Surgeon: Rogene Houston, MD;  [facility];  Service: Endoscopy;  Laterality: N/A;  100  . ESOPHAGOGASTRODUODENOSCOPY N/A 09/20/2012   Procedure: ESOPHAGOGASTRODUODENOSCOPY (EGD);  Surgeon: Rogene Houston, MD;  [facility];  Service: Endoscopy;  Laterality: N/A;  . EUS N/A 10/07/2012   Procedure: UPPER ENDOSCOPIC ULTRASOUND (EUS) LINEAR;  Surgeon: Milus Banister, MD;  Location: [facility];  Service: Endoscopy;  Laterality: N/A;  . INCISION AND DRAINAGE ABSCESS ANAL    . LAPAROSCOPIC GASTRIC BANDING  2006   has been removed  . PORT-A-CATH REMOVAL Left 12/07/2012   Procedure: REMOVAL  PORT-A-CATH;  Surgeon: Scherry Ran, MD;  Location: [facility];  Service: General;  Laterality: Left;  . PORTACATH PLACEMENT  07/07/2011   Procedure: INSERTION PORT-A-CATH;  Surgeon: Scherry Ran, MD;  Location: [facility];  Service: General;  Laterality: N/A;  . PORTACATH PLACEMENT N/A 12/07/2012   Procedure: INSERTION PORT-A-CATH;  Surgeon: Scherry Ran, MD;  Location: [facility];  Service: General;  Laterality: N/A;  Attempted portacath placement on left and right side  . WRIST SURGERY     Left; removal of bone fragment    SOCIAL HISTORY: Social History   Social History  . Marital status: Married    Spouse name: N/A  . Number of children: N/A  . Years of education: 90   Occupational History  . Nurse, learning disability   . [employer]   Social History Main Topics  . Smoking status: Former Smoker    Packs/day: 0.25    Years: 1.00    Types: Cigarettes, Cigars    Quit date: 05/17/2001  . Smokeless tobacco: Never Used  . Alcohol use No  . Drug use: No  .  Sexual activity: Yes    Birth control/ protection: None   Other Topics Concern  . Not on file   Social History Narrative  . No narrative on file    FAMILY HISTORY: Family History  Problem Relation Age of Onset  . Heart disease Mother   . Cancer Mother   . Diabetes Father   . Arthritis    . Anesthesia problems Neg Hx   . Hypotension Neg Hx   . Malignant hyperthermia Neg Hx   . Pseudochol deficiency Neg Hx     Review of Systems  Constitutional: Positive for fatigue. HENT: Negative.   Eyes: Negative.   Respiratory: Negative. Cardiovascular: Negative.   Gastrointestinal: Positive for diarrhea  Genitourinary: Negative.   Musculoskeletal: Positive for back pain Back pain in center of his back Skin: Negative.   Neurological: Negative.  Psychiatric/Behavioral: Negative.    14 point review of systems was performed and is negative except as detailed under history of present illness and  above  PHYSICAL EXAMINATION  ECOG PERFORMANCE STATUS: 1 - Symptomatic but completely ambulatory  Vitals:   03/06/16 1351  BP: 126/76  Pulse: (!) 48  Resp: 20  Temp: 97.8 F (36.6 C)    Physical Exam  Constitutional: He is oriented to person, place, and time and well-developed, well-nourished, and in no distress. Obese and wearing boot on left foot. HENT:  Head: Normocephalic and atraumatic.  Nose: Nose normal.  Mouth/Throat: Oropharynx is clear and moist. No oropharyngeal exudate.  Eyes: Conjunctivae and EOM are normal. Pupils are equal, round, and reactive to light. Right eye exhibits no discharge. Left eye exhibits no discharge. No scleral icterus.  Neck: Normal range of motion. Neck supple. No tracheal deviation present. No thyromegaly present.  Cardiovascular: Normal rate, regular rhythm and normal heart sounds.  Exam reveals no gallop and no friction rub.   No murmur heard. Pulmonary/Chest: Effort normal and breath sounds normal. He has no wheezes. He has no rales.  Abdominal: Soft. Bowel sounds are normal. He exhibits no distension and no mass. There is no tenderness. There is no rebound and no guarding.  Musculoskeletal: Normal range of motion.  Extremities: Knee high compression stockings on the lower extremities bilaterally. Lymphadenopathy:    He has no cervical adenopathy.  Neurological: He is alert and oriented to person, place, and time. He has normal reflexes. No cranial nerve deficit. Gait normal. Coordination normal.  Skin: Skin is warm and dry. No rash noted.  Psychiatric: Mood, memory, affect and judgment normal.  Nursing note and vitals reviewed.   LABORATORY DATA: I have reviewed the data as listed CBC    Component Value Date/Time   WBC 8.0 03/06/2016 1431   RBC 3.31 (L) 03/06/2016 1431   HGB 10.3 (L) 03/06/2016 1431   HCT 31.6 (L) 03/06/2016 1431   PLT 137 (L) 03/06/2016 1431   MCV 95.5 03/06/2016 1431   MCH 31.1 03/06/2016 1431   MCHC 32.6  03/06/2016 1431   RDW 15.6 (H) 03/06/2016 1431   LYMPHSABS 2.4 03/06/2016 1431   MONOABS 1.2 (H) 03/06/2016 1431   EOSABS 0.3 03/06/2016 1431   BASOSABS 0.0 03/06/2016 1431   CMP     Component Value Date/Time   NA 140 03/06/2016 1431   K 3.1 (L) 03/06/2016 1431   CL 113 (H) 03/06/2016 1431   CO2 22 03/06/2016 1431   GLUCOSE 112 (H) 03/06/2016 1431   BUN 30 (H) 03/06/2016 1431   CREATININE 1.56 (H) 03/06/2016 1431   CREATININE 2.24 (  H) 10/05/2012 1411   CALCIUM 8.3 (L) 03/06/2016 1431   CALCIUM 8.3 (L) 02/07/2016 1521   PROT 5.4 (L) 03/06/2016 1431   ALBUMIN 3.1 (L) 03/06/2016 1431   AST 79 (H) 03/06/2016 1431   ALT 87 (H) 03/06/2016 1431   ALKPHOS 83 03/06/2016 1431   BILITOT 0.8 03/06/2016 1431   GFRNONAA 48 (L) 03/06/2016 1431   GFRAA 55 (L) 03/06/2016 1431   RADIOLOGY: I have personally reviewed the data as listed below.  Study Result   CLINICAL DATA:  Chest congestion. Productive cough. Headache. Myeloma.  EXAM: CHEST  2 VIEW  COMPARISON:  02/25/2015  FINDINGS: Power injectable right Port-A-Cath tip:  SVC.  Left lateral diaphragmatic hernia containing colon, with suspected adjacent atelectasis.  The right lung appears clear.  Free osteochondral fragments in the right glenohumeral joint along the subscapular recess.  Thoracic spondylosis.  IMPRESSION: 1. No pneumonia is identified. There is likely some mild passive atelectasis adjacent to the left diaphragmatic hernia which contains splenic flexure. 2. Free osteochondral fragments in the right shoulder joint, lodged in the subscapular recess. 3. Thoracic spondylosis.   Electronically Signed   By: Van Clines M.D.   On: 06/25/2015 10:26     ASSESSMENT and THERAPY PLAN:   Multiple myeloma, light chain disease Evaluation at [facility] for transplant, felt not to be a good transplant candidate Stage III CKD Diabetes Hypokalemia IBS/diarrhea Elevated liver enzymes/fatty liver Low  midline back pain Hypogammaglobulenemia  Kappa/lambda light chain ratio remains WNL.  Other available labs are reviewed with the patient in detail.  He is realistically doing well. He would like to continue on bimonthly follow-up.  He still desires monthly labs. His mother was a patient at [facility] and died from myeloma; because of this [name] has at times a lot of anxiety about his illness.  He will continue on revlimid.   He does not need any refills at this time. He is currently having no difficulties getting refills.   He continues to follow with Dr. Laural Golden for his IBS and fatty liver. He has recently been started on cholestyramine for his diarrhea.   He is complaining of low midline back pain. Will check lumbar spine plain films to evaluate given his history of myeloma.  Patient will receive flu shot and come back for PCV 13. He will follow same 6 week schedule for blood work.  Will increase his potassium for his hypokalemia.  He has chronic hypogammaglobulenemia, no evidence of recurrent infections. Continue to monitor.   I will follow up with patient in 2 months.   Orders Placed This Encounter  Procedures  . DG Lumbar Spine Complete    Standing Status:   Future    Number of Occurrences:   1    Standing Expiration Date:   03/06/2017    Order Specific Question:   Reason for Exam (SYMPTOM  OR DIAGNOSIS REQUIRED)    Answer:   myeloma back pain    Order Specific Question:   Preferred imaging location?    Answer:   [facility]    All questions were answered. The patient knows to call the clinic with any problems, questions or concerns. We can certainly see the patient much sooner if necessary.   This note was electronically signed.  This document serves as a record of services personally performed by Ancil Linsey, MD. It was created on her behalf by Elmyra Ricks, a trained medical scribe. The creation of this record is based on the  scribe's personal observations and the  provider's statements to them. This document has been checked and approved by the attending provider.  I have reviewed the above documentation for accuracy and completeness and I agree with the above.  Patrici Ranks, MD

## 2016-03-06 NOTE — Progress Notes (Signed)
Roda Shutters presented for Portacath access and flush.  Portacath located right chest wall accessed with  H 20 needle.  Good blood return present. Portacath flushed with 15ml NS and 500U/45ml Heparin and needle removed intact.  Procedure tolerated well and without incident.

## 2016-03-07 LAB — IGG, IGA, IGM
IgA: 53 mg/dL — ABNORMAL LOW (ref 90–386)
IgG (Immunoglobin G), Serum: 251 mg/dL — ABNORMAL LOW (ref 700–1600)
IgM, Serum: 35 mg/dL (ref 20–172)

## 2016-03-07 LAB — PROTEIN ELECTROPHORESIS, SERUM
A/G Ratio: 1.5 (ref 0.7–1.7)
Albumin ELP: 2.8 g/dL — ABNORMAL LOW (ref 2.9–4.4)
Alpha-1-Globulin: 0.2 g/dL (ref 0.0–0.4)
Alpha-2-Globulin: 0.7 g/dL (ref 0.4–1.0)
Beta Globulin: 0.8 g/dL (ref 0.7–1.3)
Gamma Globulin: 0.2 g/dL — ABNORMAL LOW (ref 0.4–1.8)
Globulin, Total: 1.9 g/dL — ABNORMAL LOW (ref 2.2–3.9)
Total Protein ELP: 4.7 g/dL — ABNORMAL LOW (ref 6.0–8.5)

## 2016-03-07 LAB — KAPPA/LAMBDA LIGHT CHAINS
Kappa free light chain: 21.9 mg/L — ABNORMAL HIGH (ref 3.3–19.4)
Kappa, lambda light chain ratio: 1.07 (ref 0.26–1.65)
Lambda free light chains: 20.4 mg/L (ref 5.7–26.3)

## 2016-03-09 ENCOUNTER — Encounter (HOSPITAL_COMMUNITY): Payer: Self-pay | Admitting: Hematology & Oncology

## 2016-03-09 DIAGNOSIS — D801 Nonfamilial hypogammaglobulinemia: Secondary | ICD-10-CM | POA: Insufficient documentation

## 2016-03-09 DIAGNOSIS — E876 Hypokalemia: Secondary | ICD-10-CM | POA: Insufficient documentation

## 2016-03-10 LAB — IMMUNOFIXATION ELECTROPHORESIS
IgA: 54 mg/dL — ABNORMAL LOW (ref 90–386)
IgG (Immunoglobin G), Serum: 263 mg/dL — ABNORMAL LOW (ref 700–1600)
IgM, Serum: 35 mg/dL (ref 20–172)
Total Protein ELP: 4.7 g/dL — ABNORMAL LOW (ref 6.0–8.5)

## 2016-03-11 ENCOUNTER — Encounter: Payer: Self-pay | Admitting: Adult Health

## 2016-03-11 ENCOUNTER — Ambulatory Visit (INDEPENDENT_AMBULATORY_CARE_PROVIDER_SITE_OTHER): Payer: Managed Care, Other (non HMO) | Admitting: Adult Health

## 2016-03-11 VITALS — BP 122/68 | HR 79 | Ht 75.0 in | Wt 365.0 lb

## 2016-03-11 DIAGNOSIS — I428 Other cardiomyopathies: Secondary | ICD-10-CM

## 2016-03-11 DIAGNOSIS — I10 Essential (primary) hypertension: Secondary | ICD-10-CM | POA: Diagnosis not present

## 2016-03-11 NOTE — Progress Notes (Signed)
Name: [name]    DOB: 1960/09/25  Age: 55 y.o.  MR#: [id number]       PCP:  [name], MD      Insurance: Payor: CIG[employer] / Plan: CIGNA MANAGED / Product Type: *No Product type* /   CC:   No chief complaint on file.   VS Vitals:   03/11/16 1547  BP: 122/68  Pulse: 79  SpO2: 97%  Weight: (!) 365 lb (165.6 kg)  Height: _0  (1.905 m)    Weights Current Weight  03/11/16 (!) 365 lb (165.6 kg)  03/06/16 (!) 372 lb 9.6 oz (169 kg)  02/26/16 (!) 368 lb 11.2 oz (167.2 kg)    Blood Pressure  BP Readings from Last 3 Encounters:  03/11/16 122/68  03/06/16 126/76  02/26/16 104/74     Admit date:  (Not on file) Last encounter with RMR:  Visit date not found   Allergy Review of patient's allergies indicates no known allergies.  Current Outpatient Prescriptions  Medication Sig Dispense Refill  . acyclovir (ZOVIRAX) 400 MG tablet TAKE ONE TABLET BY MOUTH EVERY MORNING. 30 tablet 5  . allopurinol (ZYLOPRIM) 300 MG tablet TAKE ONE TABLET BY MOUTH ONCE DAILY. 30 tablet 5  . atorvastatin (LIPITOR) 80 MG tablet TAKE ONE TABLET BY MOUTH AT BEDTIME. 30 tablet 11  . calcitRIOL (ROCALTROL) 0.25 MCG capsule Take 0.25 mcg by mouth 3 (three) times a week. Monday, Wednesday, Friday.    . calcium carbonate (OS-CAL) 600 MG TABS tablet Take 1,800 mg by mouth 2 (two) times daily.     . cholestyramine (QUESTRAN) 4 g packet Take 1 packet (4 g total) by mouth 2 (two) times daily. 60 each 0  . CREON 36000 units CPEP capsule TAKE 5 CAPSULES BY MOUTH DAILY WITH MEALS AND 2 CAPSULES WITH SNACKS. 228 capsule 5  . dexamethasone (DECADRON) 4 MG tablet TAKE 5 TABLETS BY MOUTH 2 TIMES A DAY EVERY FRIDAY. 40 tablet 5  . dexlansoprazole (DEXILANT) 60 MG capsule Take 1 capsule (60 mg total) by mouth daily. 30 capsule 11  . dicyclomine (BENTYL) 20 MG tablet Take 1 tablet (20 mg total) by mouth 3 (three) times daily before meals. 90 tablet 5  . diphenoxylate-atropine (LOMOTIL) 2.5-0.025 MG tablet TAKE (2)  TABLETS BY MOUTH TWICE DAILY. 120 tablet 5  . HYDROcodone-acetaminophen (NORCO/VICODIN) 5-325 MG per tablet Take 1-2 tablets by mouth every 4 (four) hours as needed for moderate pain or severe pain. 90 tablet 0  . insulin detemir (LEVEMIR) 100 UNIT/ML injection Inject 40 Units into the skin every morning.     . insulin lispro (HUMALOG) 100 UNIT/ML injection Inject 5-30 Units into the skin 2 (two) times daily with a meal. Sliding Scale per patient    . lenalidomide ([name]) 5 MG capsule Take 1 capsule (5 mg total) by mouth daily. Take 1 tablet PO 7 days on and 7 days off. 14 capsule 0  . lisinopril (PRINIVIL,ZESTRIL) 5 MG tablet Take 5 mg by mouth every other day.    . loratadine (CLARITIN) 10 MG tablet Take 10 mg by mouth every morning.     . metoprolol succinate (TOPROL-XL) 25 MG 24 hr tablet TAKE ONE TABLET BY MOUTH ONCE DAILY. 30 tablet 0  . Multiple Vitamins-Minerals (MULTIVITAMINS THER. W/MINERALS) TABS Take 1 tablet by mouth daily.     . nitroGLYCERIN (NITROSTAT) 0.4 MG SL tablet Place 1 tablet (0.4 mg total) under the tongue every 5 (five) minutes as needed for chest pain.  25 tablet 3  . potassium chloride SA (K-DUR,KLOR-CON) 20 MEQ tablet TAKE 2 TABLETS BY MOUTH TWICE DAILY. 120 tablet 2  . rivaroxaban (XARELTO) 20 MG TABS tablet Take 1 tablet (20 mg total) by mouth daily. 30 tablet 3  . sodium bicarbonate 650 MG tablet Take 1,300 mg by mouth 3 (three) times daily.    Marland Kitchen sulfamethoxazole-trimethoprim (BACTRIM DS,SEPTRA DS) 800-160 MG tablet TAKE ONE TABLET BY MOUTH EVERY MONDAY, WEDNESDAY, AND FRIDAY. 12 tablet 5  . torsemide (DEMADEX) 20 MG tablet TAKE 2 TABLETS BY MOUTH EACH MORNING AND 1 TABLET EACH EVENING. 240 tablet 3  . Vitamin D, Ergocalciferol, (DRISDOL) 50000 units CAPS capsule Take 1 capsule (50,000 Units total) by mouth every 30 (thirty) days. 6 capsule 1   No current facility-administered medications for this visit.    Facility-Administered Medications Ordered in Other Visits   Medication Dose Route Frequency Provider Last Rate Last Dose  . sodium chloride 0.9 % injection 10 mL  10 mL Intravenous Once Farrel Gobble, MD        Discontinued Meds:   There are no discontinued medications.  Patient Active Problem List   Diagnosis Date Noted  . Hypogammaglobulinemia ([place]) 03/09/2016  . Hypokalemia 03/09/2016  . Diaphragmatic hernia without obstruction 02/26/2016  . Chronic systolic heart failure ([place]) 12/19/2013  . Anemia, normocytic normochromic 10/09/2012  . Chronic pancreatitis ([place]) 10/07/2012  . Atrial flutter ([place]) 09/17/2012  . DDD (degenerative disc disease), cervical 03/18/2012  . Arteriosclerotic cardiovascular disease (ASCVD)   . Hypertension   . Hyperlipidemia   . Multiple myeloma ([place]) 07/01/2011  . Morbid obesity ([place]) 04/29/2010  . DIABETES MELLITUS, TYPE II, ON INSULIN 11/15/2008  . SLEEP APNEA 11/15/2008    LABS    Component Value Date/Time   NA 140 03/06/2016 1431   NA 139 02/07/2016 1521   NA 140 01/10/2016 1500   K 3.1 (L) 03/06/2016 1431   K 3.7 02/07/2016 1521   K 3.8 01/10/2016 1500   CL 113 (H) 03/06/2016 1431   CL 109 02/07/2016 1521   CL 112 (H) 01/10/2016 1500   CO2 22 03/06/2016 1431   CO2 20 (L) 02/07/2016 1521   CO2 23 01/10/2016 1500   GLUCOSE 112 (H) 03/06/2016 1431   GLUCOSE 186 (H) 02/07/2016 1521   GLUCOSE 132 (H) 01/10/2016 1500   BUN 30 (H) 03/06/2016 1431   BUN 33 (H) 02/07/2016 1521   BUN 35 (H) 01/10/2016 1500   CREATININE 1.56 (H) 03/06/2016 1431   CREATININE 1.86 (H) 02/07/2016 1521   CREATININE 1.83 (H) 01/10/2016 1500   CREATININE 2.24 (H) 10/05/2012 1411   CREATININE 1.82 (H) 09/17/2012 1613   CREATININE 1.60 (H) 10/20/2011 1758   CALCIUM 8.3 (L) 03/06/2016 1431   CALCIUM 8.5 (L) 02/07/2016 1521   CALCIUM 8.3 (L) 02/07/2016 1521   CALCIUM 8.4 (L) 01/10/2016 1500   CALCIUM 8.8 03/08/2015 1410   CALCIUM 6.8 (L) 08/15/2013 1217   GFRNONAA 48 (L) 03/06/2016 1431   GFRNONAA 39 (L) 02/07/2016  1521   GFRNONAA 40 (L) 01/10/2016 1500   GFRAA 55 (L) 03/06/2016 1431   GFRAA 45 (L) 02/07/2016 1521   GFRAA 46 (L) 01/10/2016 1500   CMP     Component Value Date/Time   NA 140 03/06/2016 1431   K 3.1 (L) 03/06/2016 1431   CL 113 (H) 03/06/2016 1431   CO2 22 03/06/2016 1431   GLUCOSE 112 (H) 03/06/2016 1431   BUN 30 (H) 03/06/2016 1431   CREATININE 1.56 (  H) 03/06/2016 1431   CREATININE 2.24 (H) 10/05/2012 1411   CALCIUM 8.3 (L) 03/06/2016 1431   CALCIUM 8.3 (L) 02/07/2016 1521   PROT 5.4 (L) 03/06/2016 1431   ALBUMIN 3.1 (L) 03/06/2016 1431   AST 79 (H) 03/06/2016 1431   ALT 87 (H) 03/06/2016 1431   ALKPHOS 83 03/06/2016 1431   BILITOT 0.8 03/06/2016 1431   GFRNONAA 48 (L) 03/06/2016 1431   GFRAA 55 (L) 03/06/2016 1431       Component Value Date/Time   WBC 8.0 03/06/2016 1431   WBC 7.4 02/07/2016 1521   WBC 7.9 01/10/2016 1500   HGB 10.3 (L) 03/06/2016 1431   HGB 10.4 (L) 02/07/2016 1521   HGB 10.7 (L) 01/10/2016 1500   HCT 31.6 (L) 03/06/2016 1431   HCT 32.0 (L) 02/07/2016 1521   HCT 32.0 (L) 01/10/2016 1500   MCV 95.5 03/06/2016 1431   MCV 95.8 02/07/2016 1521   MCV 95.5 01/10/2016 1500    Lipid Panel     Component Value Date/Time   CHOL 158 07/18/2010 1724   TRIG 173 07/18/2010 1724   HDL 33 07/18/2010 1724   LDLCALC 90 07/18/2010 1724    ABG No results found for: PHART, PCO2ART, PO2ART, HCO3, TCO2, ACIDBASEDEF, O2SAT   Lab Results  Component Value Date   TSH 0.561 09/17/2012   BNP (last 3 results) No results for input(s): BNP in the last 8760 hours.  ProBNP (last 3 results) No results for input(s): PROBNP in the last 8760 hours.  Cardiac Panel (last 3 results) No results for input(s): CKTOTAL, CKMB, TROPONINI, RELINDX in the last 72 hours.  Iron/TIBC/Ferritin/ %Sat    Component Value Date/Time   IRON 53 02/12/2016 1415   TIBC 340 02/12/2016 1415   FERRITIN 102 02/12/2016 1415   IRONPCTSAT 16 (L) 02/12/2016 1415     EKG Orders placed or  performed in visit on 03/11/16  . EKG 12-Lead     Prior Assessment and Plan Problem List as of 03/11/2016 Reviewed: 03/09/2016  1:56 PM by Molli Hazard, MD     Cardiovascular and Mediastinum   Arteriosclerotic cardiovascular disease (ASCVD)   Last Assessment & Plan 10/26/2012 Office Visit Written 10/26/2012  4:47 PM by Yehuda Savannah, MD    Patient describes no symptoms suggesting recurrent myocardial ischemia; no signs nor symptoms of decompensated congestive heart failure. Current medical therapy appears effective.      Hypertension   Last Assessment & Plan 01/16/2014 Office Visit Written 01/16/2014  6:36 PM by Evans Lance, MD    His blood pressure is currently well controlled. No change in medical therapy.      Atrial flutter [facility])   Last Assessment & Plan 10/26/2012 Office Visit Written 10/26/2012  4:47 PM by Yehuda Savannah, MD    Atypical atrial flutter has recurred soon after successful cardioversion, but is causing no apparent symptoms. We will pursue a strategy of rate control plus anticoagulation.      Chronic systolic heart failure [facility])   Last Assessment & Plan 01/16/2014 Office Visit Written 01/16/2014  6:36 PM by Evans Lance, MD    His chronic systolic heart failure appears to be well compensated. His symptoms are class II. This is despite severe left ventricular dysfunction. ICD implantation would be a consideration in this patient, but his comorbidities including indwelling Port-A-Cath, as well as multiple myeloma in remission, as well as renal insufficiency, as well as morbid obesity, make ICD implantation relatively contraindicated.  Respiratory   SLEEP APNEA   Diaphragmatic hernia without obstruction     Digestive   Chronic pancreatitis [facility])   Last Assessment & Plan 10/26/2012 Office Visit Edited 10/26/2012  5:20 PM by Yehuda Savannah, MD    Patient continues to experience diarrhea, which has been attributed to pancreatic insufficiency.  Appropriate therapy is being directed by GI.  Current medication list includes both Dexilant and Protonix; the latter drug will be discontinued.        Endocrine   DIABETES MELLITUS, TYPE II, ON INSULIN   Last Assessment & Plan 07/28/2011 Office Visit Edited 08/03/2011  8:19 AM by Yehuda Savannah, MD    Diabetic control has been worse since treatment with glucocorticoids initiated.  I suggest allowing somewhat less than optimal control during treatment for multiple myeloma and subsequently aiming for a hemoglobin A1c level of 7.        Musculoskeletal and Integument   DDD (degenerative disc disease), cervical     Other   Morbid obesity [facility])   Last Assessment & Plan 07/28/2011 Office Visit Written 07/28/2011  1:11 PM by Yehuda Savannah, MD    Patient is congratulated on a drop in weight loss and encouraged to continue to be as active as possible and to limit caloric intake.        Multiple myeloma [facility])   Last Assessment & Plan 01/10/2016 Office Visit Edited 01/10/2016  2:30 PM by Baird Cancer, PA-C    Stage II, multiple myeloma (kappa light chain specificity) with no evidence of serum m-protein; S/P [facility] evaluate for transplant and not felt to be a candidate.  Bone marrow aspiration and biopsy at the time of diagnosis demonstrated 19% plasma cells and urine SPEP demonstrated 3 g of bence-jones proteins.  Bone marrow aspiration and biopsy on 09/28/2014 showed variably cellular marrow with trilineage hematopoiesis and 2% plasma cells. IHC confirmed the presence of a minor plasma cell component which appears to show polyclonal staining pattern for Kapp and lambda light chain. Latter appearance is nonspecific and not diagnostic of a plasma cell neoplasm. Background shows trilineage hematopoiesis with nonspecific changes.  He continues on Lenalidomide 5 mg 7 days on and 7 days off with 20 mg Dexamethasone BID every Friday only.  Last zometa 09/07/2014  Labs are drawn today with port flush: CBC  diff, CMET, SPEP+IFE, and light chain assay.  I personally reviewed and went over laboratory results with the patient.  The results are noted within this dictation.    He continues to follow with his podiatrist, Dr. Caprice Beaver who currently has Ricard in a left foot walking shoe.  Labs monthly: CBC diff, CMET, SPEP+IFE, and light chain assay.  He will continue on lenalidomide 5 mg 7 days on and 7 days off with 20 mg Dexamethasone BID every Friday only.  Last zometa was 09/07/2014 due to concerns regarding potential osteonecrosis of jaw.  Return in 8 weeks for follow-up.  I think if he is well in 2 months, we can consider spacing out appointments to every 3 months.      Hyperlipidemia   Last Assessment & Plan 10/26/2012 Office Visit Written 10/27/2012  9:43 AM by Yehuda Savannah, MD    Lipid profile was acceptable in 2012; repeat testing will be performed with next blood draw.      Anemia, normocytic normochromic   Hypogammaglobulinemia (HCC)   Hypokalemia       Imaging: Dg Lumbar Spine Complete  Result Date: 03/07/2016 CLINICAL DATA:  Mid low back pain slightly to the right with no radicular symptoms. Duration of symptoms 1 week EXAM: LUMBAR SPINE - COMPLETE 4+ VIEW COMPARISON:  Coronal and sagittal images through the lumbar spine from a CT scan of February 25, 2015 FINDINGS: The images are technically limited due to scatter effects and overlying gas and stool. The lumbar vertebral bodies are preserved in height. There is disc space narrowing at L2-3, L3-4, and L4-5. There is no spondylo listhesis. There are anterior endplate osteophytes at multiple levels. There is facet joint hypertrophy from L3 through S1. There is no spondylolisthesis. There are calcifications in the right upper quadrant consistent with gallstones. There are vascular calcifications in the abdominal aorta and iliac arteries. IMPRESSION: Multilevel degenerative disc disease of the lumbar spine. Prominent spondylosis. No  compression fracture. Gallstones Aortoiliac atherosclerosis. Electronically Signed   By: David  Martinique M.D.   On: 03/07/2016 08:03

## 2016-03-11 NOTE — Patient Instructions (Signed)
Your physician recommends that you schedule a follow-up appointment with Dr. Shawna Orleans.   Your physician has requested that you have an echocardiogram. Echocardiography is a painless test that uses sound waves to create images of your heart. It provides your doctor with information about the size and shape of your heart and how well your heart's chambers and valves are working. This procedure takes approximately one hour. There are no restrictions for this procedure.  Your physician recommends that you continue on your current medications as directed. Please refer to the Current Medication list given to you today.  If you need a refill on your cardiac medications before your next appointment, please call your pharmacy.  Thank you for choosing [facility]!

## 2016-03-11 NOTE — Progress Notes (Signed)
Cardiology Office Note   Date:  03/11/2016   ID:  Cullan, Launer 11-18-1960, MRN [id number]  PCP:  [name], MD  Cardiologist: Willis Modena, NP   No chief complaint on file.     History of Present Illness: DURWIN DAVISSON is a 55 y.o. male who presents for ongoing assessment and management of coronary artery disease, long-standing LV dysfunction with EF of 30%, he does not have ICD implant although recommended by Dr. Lovena Le. Other history includes atrial fibrillation on Xarelto, and diabetes. The patient also is being followed by oncology for multiple myeloma. Other history includes chronic renal insufficiency. He was last seen by Dr. Lovena Le in August 2015 for ongoing management. He is mild multiple myeloma was in remission.  He comes today to be reestablished. He denies any chest pain palpitations dyspnea on exertion. He has chronic lower extremity edema. He continues with chemotherapy for his multiple myeloma. He has some fatigue. He is also been diagnosed with cholelithiasis, and arthritis of both feet with wounds being cared for by primary care.  Echocardiogram 10/17/2013 Procedure narrative: Transthoracic echocardiography. Image quality was adequate. Intravenous contrast (Optison) was administered. - Left ventricle: The cavity size was mildly dilated. Wall thickness was increased in a pattern of mild LVH. Systolic function was severely reduced. The estimated ejection fraction was approximately 30%. Features are consistent with a pseudonormal left ventricular filling pattern, with concomitant abnormal relaxation and increased filling pressure (grade 2 diastolic dysfunction). Doppler parameters are consistent with high ventricular filling pressure. - Regional wall motion abnormality: Akinesis of the mid anterior, mid anteroseptal, and apical myocardium; moderate hypokinesis of the apical anterior, apical inferior, apical septal,  and apical lateral myocardium. - Left atrium: The atrium was mildly dilated. - Tricuspid valve: There was mild regurgitation.  Past Medical History:  Diagnosis Date  . Arteriosclerotic cardiovascular disease (ASCVD)    MI-2000s; stent to the proximal LAD and diagonal in 2001; stress nuclear in 2008-impaired exercise capacity, left ventricular dilatation, moderately to severely depressed EF, apical, inferior and anteroseptal scar  . Atrial flutter ([place])   . 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 Cellulitis of leg    both legs  . Chronic diarrhea   . Chronic kidney disease, stage 3, mod decreased GFR    Creatinine of 1.84 in 06/2011 and 1.5 in 07/2011  . Diabetes mellitus    Insulin  . GERD (gastroesophageal reflux disease)   . Gout   . Hyperlipidemia   . Hypertension   . Injection site reaction   . Multiple myeloma 07/01/2011  . Myocardial infarction 2000  . Obesity   . Pedal edema    Venous insufficiency  . Sleep apnea    uses cpap  . Ulcer [facility])     Past Surgical History:  Procedure Laterality Date  . ABSCESS DRAINAGE     Scrotal  . BIOPSY  01/02/2012   Procedure: BIOPSY;  Surgeon: Rogene Houston, MD;  [facility];  Service: Endoscopy;  Laterality: N/A;  . BONE MARROW BIOPSY  05/13/11  . CARDIAC CATHETERIZATION     cardiac stent  . CARDIOVERSION N/A 10/13/2012   Procedure: CARDIOVERSION;  Surgeon: Yehuda Savannah, MD;  Location: [facility];  Service: Cardiovascular;  Laterality: N/A;  . CATARACT EXTRACTION W/PHACO Left 02/13/2014   Procedure: CATARACT EXTRACTION PHACO AND INTRAOCULAR LENS PLACEMENT ([place]);  Surgeon: Tonny Branch, MD;  Location: [facility];  Service: Ophthalmology;  Laterality: Left;  CDE:  7.67  . CATARACT  EXTRACTION W/PHACO Right 03/02/2014   Procedure: CATARACT EXTRACTION PHACO AND INTRAOCULAR LENS PLACEMENT RIGHT EYE CDE=16.81;  Surgeon: Tonny Branch, MD;  Location: [facility];  Service: Ophthalmology;  Laterality: Right;  . COLONOSCOPY  11/28/2011    Procedure: COLONOSCOPY;  Surgeon: Rogene Houston, MD;  [facility];  Service: Endoscopy;  Laterality: N/A;  930  . ESOPHAGOGASTRODUODENOSCOPY  01/02/2012   Procedure: ESOPHAGOGASTRODUODENOSCOPY (EGD);  Surgeon: Rogene Houston, MD;  [facility];  Service: Endoscopy;  Laterality: N/A;  100  . ESOPHAGOGASTRODUODENOSCOPY N/A 09/20/2012   Procedure: ESOPHAGOGASTRODUODENOSCOPY (EGD);  Surgeon: Rogene Houston, MD;  [facility];  Service: Endoscopy;  Laterality: N/A;  . EUS N/A 10/07/2012   Procedure: UPPER ENDOSCOPIC ULTRASOUND (EUS) LINEAR;  Surgeon: Milus Banister, MD;  [facility];  Service: Endoscopy;  Laterality: N/A;  . INCISION AND DRAINAGE ABSCESS ANAL    . LAPAROSCOPIC GASTRIC BANDING  2006   has been removed  . PORT-A-CATH REMOVAL Left 12/07/2012   Procedure: REMOVAL PORT-A-CATH;  Surgeon: Scherry Ran, MD;  [facility]: [facility];  Service: General;  Laterality: Left;  . PORTACATH PLACEMENT  07/07/2011   Procedure: INSERTION PORT-A-CATH;  Surgeon: Scherry Ran, MD;  [facility];  Service: General;  Laterality: N/A;  . PORTACATH PLACEMENT N/A 12/07/2012   Procedure: INSERTION PORT-A-CATH;  Surgeon: Scherry Ran, MD;  Location: [facility];  Service: General;  Laterality: N/A;  Attempted portacath placement on left and right side  . WRIST SURGERY     Left; removal of bone fragment     Current Outpatient Prescriptions  Medication Sig Dispense Refill  . acyclovir (ZOVIRAX) 400 MG tablet TAKE ONE TABLET BY MOUTH EVERY MORNING. 30 tablet 5  . allopurinol (ZYLOPRIM) 300 MG tablet TAKE ONE TABLET BY MOUTH ONCE DAILY. 30 tablet 5  . atorvastatin (LIPITOR) 80 MG tablet TAKE ONE TABLET BY MOUTH AT BEDTIME. 30 tablet 11  . calcitRIOL (ROCALTROL) 0.25 MCG capsule Take 0.25 mcg by mouth 3 (three) times a week. Monday, Wednesday, Friday.    . calcium carbonate (OS-CAL) 600 MG TABS tablet Take 1,800 mg by mouth 2 (two) times daily.     .  cholestyramine (QUESTRAN) 4 g packet Take 1 packet (4 g total) by mouth 2 (two) times daily. 60 each 0  . CREON 36000 units CPEP capsule TAKE 5 CAPSULES BY MOUTH DAILY WITH MEALS AND 2 CAPSULES WITH SNACKS. 228 capsule 5  . dexamethasone (DECADRON) 4 MG tablet TAKE 5 TABLETS BY MOUTH 2 TIMES A DAY EVERY FRIDAY. 40 tablet 5  . dexlansoprazole (DEXILANT) 60 MG capsule Take 1 capsule (60 mg total) by mouth daily. 30 capsule 11  . dicyclomine (BENTYL) 20 MG tablet Take 1 tablet (20 mg total) by mouth 3 (three) times daily before meals. 90 tablet 5  . diphenoxylate-atropine (LOMOTIL) 2.5-0.025 MG tablet TAKE (2) TABLETS BY MOUTH TWICE DAILY. 120 tablet 5  . HYDROcodone-acetaminophen (NORCO/VICODIN) 5-325 MG per tablet Take 1-2 tablets by mouth every 4 (four) hours as needed for moderate pain or severe pain. 90 tablet 0  . insulin detemir (LEVEMIR) 100 UNIT/ML injection Inject 40 Units into the skin every morning.     . insulin lispro (HUMALOG) 100 UNIT/ML injection Inject 5-30 Units into the skin 2 (two) times daily with a meal. Sliding Scale per patient    . lenalidomide ([name]) 5 MG capsule Take 1 capsule (5 mg total) by mouth daily. Take 1 tablet PO 7 days on  and 7 days off. 14 capsule 0  . lisinopril (PRINIVIL,ZESTRIL) 5 MG tablet Take 5 mg by mouth every other day.    . loratadine (CLARITIN) 10 MG tablet Take 10 mg by mouth every morning.     . metoprolol succinate (TOPROL-XL) 25 MG 24 hr tablet TAKE ONE TABLET BY MOUTH ONCE DAILY. 30 tablet 0  . Multiple Vitamins-Minerals (MULTIVITAMINS THER. W/MINERALS) TABS Take 1 tablet by mouth daily.     . nitroGLYCERIN (NITROSTAT) 0.4 MG SL tablet Place 1 tablet (0.4 mg total) under the tongue every 5 (five) minutes as needed for chest pain. 25 tablet 3  . potassium chloride SA (K-DUR,KLOR-CON) 20 MEQ tablet TAKE 2 TABLETS BY MOUTH TWICE DAILY. 120 tablet 2  . rivaroxaban (XARELTO) 20 MG TABS tablet Take 1 tablet (20 mg total) by mouth daily. 30 tablet 3   . sodium bicarbonate 650 MG tablet Take 1,300 mg by mouth 3 (three) times daily.    Marland Kitchen sulfamethoxazole-trimethoprim (BACTRIM DS,SEPTRA DS) 800-160 MG tablet TAKE ONE TABLET BY MOUTH EVERY MONDAY, WEDNESDAY, AND FRIDAY. 12 tablet 5  . torsemide (DEMADEX) 20 MG tablet TAKE 2 TABLETS BY MOUTH EACH MORNING AND 1 TABLET EACH EVENING. 240 tablet 3  . Vitamin D, Ergocalciferol, (DRISDOL) 50000 units CAPS capsule Take 1 capsule (50,000 Units total) by mouth every 30 (thirty) days. 6 capsule 1   No current facility-administered medications for this visit.    Facility-Administered Medications Ordered in Other Visits  Medication Dose Route Frequency Provider Last Rate Last Dose  . sodium chloride 0.9 % injection 10 mL  10 mL Intravenous Once Farrel Gobble, MD        Allergies:   Review of patient's allergies indicates no known allergies.    Social History:  The patient  reports that he quit smoking about 14 years ago. His smoking use included Cigarettes and Cigars. He has a 0.25 pack-year smoking history. He has never used smokeless tobacco. He reports that he does not drink alcohol or use drugs.   Family History:  The patient's family history includes Cancer in his mother; Diabetes in his father; Heart disease in his mother.    ROS: All other systems are reviewed and negative. Unless otherwise mentioned in H&P    PHYSICAL EXAM: VS:  There were no vitals taken for this visit. , BMI There is no height or weight on file to calculate BMI. GEN: Well nourished, well developed, in no acute distress  HEENT: normal  Neck: no JVD, carotid bruits, or masses Cardiac: Distant heart sounds, 1/6 systolic murmur, IRRR; no murmurs, rubs, or gallops,no edema  Respiratory Mild bibasilar crackles, without wheezes or cough. GI: soft, nontender, nondistended, + BS MS: no deformity or atrophy  Skin: warm and dry, no rash Neuro:  Strength and sensation are intact Psych: euthymic mood, full affect   EKG:  Atrial fibrillation rate of 47 bpm.  Recent Labs: 03/06/2016: ALT 87; BUN 30; Creatinine, Ser 1.56; Hemoglobin 10.3; Platelets 137; Potassium 3.1; Sodium 140    Lipid Panel    Component Value Date/Time   CHOL 158 07/18/2010 1724   TRIG 173 07/18/2010 1724   HDL 33 07/18/2010 1724   LDLCALC 90 07/18/2010 1724      Wt Readings from Last 3 Encounters:  03/06/16 (!) 372 lb 9.6 oz (169 kg)  02/26/16 (!) 368 lb 11.2 oz (167.2 kg)  01/10/16 (!) 364 lb (165.1 kg)     ASSESSMENT AND PLAN:  1. NICM with reduced systolic dysfunction:  Most recent echocardiogram was completed in 2015, which revealed EF of 30-35%. The patient refused ICD at that time. I'll repeat his echocardiogram for evaluation of systolic function. He will continue beta blocker carvedilol 3.125 mg twice a day, may up titrate this dose on follow-up once LV function is evaluated. He will continue lisinopril 20 mg daily May need to consider change to [facility]. He offers no complaints of palpitations or near syncope. He will follow-up with next available cardiologist to be reestablished as he has not been seen in over 2 years .  2. Hypertension: Currently well-controlled. Medication adjustments based upon echocardiogram results. No changes for now.  3. Atrial fibrillation: Heart rate is low normal at 47 bpm. Will not make any changes on his medication regimen until LV function is assessed. He is not appear to be symptomatic with dizziness or near syncope with bradycardia. Continue metoprolol as directed.   Current medicines are reviewed at length with the patient today.    Labs/ tests ordered today include: Echocardiogram  No orders of the defined types were placed in this encounter.    Disposition:   FU with next available cardiologists in 2-3 weeks   Signed, Jory Sims, NP  03/11/2016 6:58 AM    [facility]. [address], [place], [place] [address] Phone: [phone]; Fax: [phone]

## 2016-03-13 ENCOUNTER — Ambulatory Visit (HOSPITAL_COMMUNITY)
Admission: RE | Admit: 2016-03-13 | Discharge: 2016-03-13 | Disposition: A | Discharge status: Home / Self Care | Payer: Managed Care, Other (non HMO) | Source: Ambulatory Visit | Attending: Adult Health | Admitting: Adult Health

## 2016-03-13 DIAGNOSIS — E119 Type 2 diabetes mellitus without complications: Secondary | ICD-10-CM | POA: Diagnosis not present

## 2016-03-13 DIAGNOSIS — I429 Cardiomyopathy, unspecified: Secondary | ICD-10-CM | POA: Diagnosis present

## 2016-03-13 DIAGNOSIS — G4733 Obstructive sleep apnea (adult) (pediatric): Secondary | ICD-10-CM | POA: Diagnosis not present

## 2016-03-13 DIAGNOSIS — E785 Hyperlipidemia, unspecified: Secondary | ICD-10-CM | POA: Insufficient documentation

## 2016-03-13 DIAGNOSIS — I119 Hypertensive heart disease without heart failure: Secondary | ICD-10-CM | POA: Insufficient documentation

## 2016-03-13 DIAGNOSIS — Z6841 Body Mass Index (BMI) 40.0 and over, adult: Secondary | ICD-10-CM | POA: Insufficient documentation

## 2016-03-13 DIAGNOSIS — I428 Other cardiomyopathies: Secondary | ICD-10-CM | POA: Diagnosis not present

## 2016-03-13 MED ORDER — PERFLUTREN LIPID MICROSPHERE
1.0000 mL | INTRAVENOUS | Status: AC | PRN
  Administered 2016-03-13: 1 mL via INTRAVENOUS
  Administered 2016-03-13: 2 mL via INTRAVENOUS

## 2016-03-13 NOTE — Progress Notes (Signed)
*  PRELIMINARY RESULTS* Echocardiogram 2D Echocardiogram has been performed.  Samuel Germany 03/13/2016, 3:25 PM

## 2016-03-15 ENCOUNTER — Other Ambulatory Visit (HOSPITAL_COMMUNITY): Payer: Self-pay | Admitting: Oncology

## 2016-03-18 ENCOUNTER — Other Ambulatory Visit (HOSPITAL_COMMUNITY): Payer: Self-pay | Admitting: Emergency Medicine

## 2016-03-18 DIAGNOSIS — C9001 Multiple myeloma in remission: Secondary | ICD-10-CM

## 2016-03-18 MED ORDER — LENALIDOMIDE 5 MG PO CAPS: 5.0000 mg | ORAL_CAPSULE | Freq: Every day | ORAL | 0 refills | Status: DC

## 2016-03-18 NOTE — Progress Notes (Signed)
revlimid refilled

## 2016-03-24 ENCOUNTER — Ambulatory Visit: Payer: Managed Care, Other (non HMO) | Admitting: Cardiovascular Disease

## 2016-03-24 ENCOUNTER — Encounter: Payer: Self-pay | Admitting: Cardiovascular Disease

## 2016-03-24 NOTE — Progress Notes (Deleted)
Cardiology Office Note   Date:  03/24/2016   ID:  Waller, Franklin [birth year], MRN [id number]  PCP:  [name], MD  Cardiologist:   Dickie La chief complaint on file.     History of Present Illness: [name] is a 55 y.o. male who presents for evaluation of ischemic DCM.  He has morbid obesity, OSA and multiple myeloma in remission. Still with porta cath in place.  Reviewed last echo from 03/13/16 and EF 35-40% Seen by Dr Lovena Le and no AICD due to comorbidities.  History of PAF.  Previous stent to LAD/D1 in 2001 last myovue 2008 scar no ischemia.  Baseline Cr around 1.8     Past Medical History:  Diagnosis Date  . Arteriosclerotic cardiovascular disease (ASCVD)    MI-2000s; stent to the proximal LAD and diagonal in 2001; stress nuclear in 2008-impaired exercise capacity, left ventricular dilatation, moderately to severely depressed EF, apical, inferior and anteroseptal scar  . Atrial flutter ([place])   . 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 Cellulitis of leg    both legs  . Chronic diarrhea   . Chronic kidney disease, stage 3, mod decreased GFR    Creatinine of 1.84 in 06/2011 and 1.5 in 07/2011  . Diabetes mellitus    Insulin  . GERD (gastroesophageal reflux disease)   . Gout   . Hyperlipidemia   . Hypertension   . Injection site reaction   . Multiple myeloma 07/01/2011  . Myocardial infarction 2000  . Obesity   . Pedal edema    Venous insufficiency  . Sleep apnea    uses cpap  . Ulcer [facility])     Past Surgical History:  Procedure Laterality Date  . ABSCESS DRAINAGE     Scrotal  . BIOPSY  01/02/2012   Procedure: BIOPSY;  Surgeon: Rogene Houston, MD;  [facility];  Service: Endoscopy;  Laterality: N/A;  . BONE MARROW BIOPSY  05/13/11  . CARDIAC CATHETERIZATION     cardiac stent  . CARDIOVERSION N/A 10/13/2012   Procedure: CARDIOVERSION;  Surgeon: Yehuda Savannah, MD;  [facility];  Service: Cardiovascular;  Laterality: N/A;   . CATARACT EXTRACTION W/PHACO Left 02/13/2014   Procedure: CATARACT EXTRACTION PHACO AND INTRAOCULAR LENS PLACEMENT ([place]);  Surgeon: Tonny Branch, MD;  Location: [facility];  Service: Ophthalmology;  Laterality: Left;  CDE:  7.67  . CATARACT EXTRACTION W/PHACO Right 03/02/2014   Procedure: CATARACT EXTRACTION PHACO AND INTRAOCULAR LENS PLACEMENT RIGHT EYE CDE=16.81;  Surgeon: Tonny Branch, MD;  [facility]: [facility];  Service: Ophthalmology;  Laterality: Right;  . COLONOSCOPY  11/28/2011   Procedure: COLONOSCOPY;  Surgeon: Rogene Houston, MD;  [facility];  Service: Endoscopy;  Laterality: N/A;  930  . ESOPHAGOGASTRODUODENOSCOPY  01/02/2012   Procedure: ESOPHAGOGASTRODUODENOSCOPY (EGD);  Surgeon: Rogene Houston, MD;  [facility];  Service: Endoscopy;  Laterality: N/A;  100  . ESOPHAGOGASTRODUODENOSCOPY N/A 09/20/2012   Procedure: ESOPHAGOGASTRODUODENOSCOPY (EGD);  Surgeon: Rogene Houston, MD;  [facility];  Service: Endoscopy;  Laterality: N/A;  . EUS N/A 10/07/2012   Procedure: UPPER ENDOSCOPIC ULTRASOUND (EUS) LINEAR;  Surgeon: Milus Banister, MD;  [facility];  Service: Endoscopy;  Laterality: N/A;  . INCISION AND DRAINAGE ABSCESS ANAL    . LAPAROSCOPIC GASTRIC BANDING  2006   has been removed  . PORT-A-CATH REMOVAL Left 12/07/2012   Procedure: REMOVAL PORT-A-CATH;  Surgeon: Scherry Ran, MD;  [facility]: [facility];  Service: General;  Laterality: Left;  . PORTACATH PLACEMENT  07/07/2011   Procedure: INSERTION PORT-A-CATH;  Surgeon: Scherry Ran, MD;  [facility];  Service: General;  Laterality: N/A;  . PORTACATH PLACEMENT N/A 12/07/2012   Procedure: INSERTION PORT-A-CATH;  Surgeon: Scherry Ran, MD;  Location: [facility];  Service: General;  Laterality: N/A;  Attempted portacath placement on left and right side  . WRIST SURGERY     Left; removal of bone fragment     Current Outpatient Prescriptions  Medication Sig Dispense Refill  . acyclovir  (ZOVIRAX) 400 MG tablet TAKE ONE TABLET BY MOUTH EVERY MORNING. 30 tablet 5  . allopurinol (ZYLOPRIM) 300 MG tablet TAKE ONE TABLET BY MOUTH ONCE DAILY. 30 tablet 5  . atorvastatin (LIPITOR) 80 MG tablet TAKE ONE TABLET BY MOUTH AT BEDTIME. 30 tablet 11  . calcitRIOL (ROCALTROL) 0.25 MCG capsule Take 0.25 mcg by mouth 3 (three) times a week. Monday, Wednesday, Friday.    . calcium carbonate (OS-CAL) 600 MG TABS tablet Take 1,800 mg by mouth 2 (two) times daily.     . cholestyramine (QUESTRAN) 4 g packet Take 1 packet (4 g total) by mouth 2 (two) times daily. 60 each 0  . CREON 36000 units CPEP capsule TAKE 5 CAPSULES BY MOUTH DAILY WITH MEALS AND 2 CAPSULES WITH SNACKS. 228 capsule 5  . dexamethasone (DECADRON) 4 MG tablet TAKE 5 TABLETS BY MOUTH 2 TIMES A DAY EVERY FRIDAY. 40 tablet 5  . dexlansoprazole (DEXILANT) 60 MG capsule Take 1 capsule (60 mg total) by mouth daily. 30 capsule 11  . dicyclomine (BENTYL) 20 MG tablet Take 1 tablet (20 mg total) by mouth 3 (three) times daily before meals. 90 tablet 5  . diphenoxylate-atropine (LOMOTIL) 2.5-0.025 MG tablet TAKE (2) TABLETS BY MOUTH TWICE DAILY. 120 tablet 5  . HYDROcodone-acetaminophen (NORCO/VICODIN) 5-325 MG per tablet Take 1-2 tablets by mouth every 4 (four) hours as needed for moderate pain or severe pain. 90 tablet 0  . insulin detemir (LEVEMIR) 100 UNIT/ML injection Inject 40 Units into the skin every morning.     . insulin lispro (HUMALOG) 100 UNIT/ML injection Inject 5-30 Units into the skin 2 (two) times daily with a meal. Sliding Scale per patient    . lenalidomide ([name]) 5 MG capsule Take 1 capsule (5 mg total) by mouth daily. Take 1 tablet PO 7 days on and 7 days off. 14 capsule 0  . lisinopril (PRINIVIL,ZESTRIL) 5 MG tablet Take 5 mg by mouth every other day.    . loratadine (CLARITIN) 10 MG tablet Take 10 mg by mouth every morning.     . metoprolol succinate (TOPROL-XL) 25 MG 24 hr tablet TAKE ONE TABLET BY MOUTH ONCE DAILY.  30 tablet 0  . Multiple Vitamins-Minerals (MULTIVITAMINS THER. W/MINERALS) TABS Take 1 tablet by mouth daily.     . nitroGLYCERIN (NITROSTAT) 0.4 MG SL tablet Place 1 tablet (0.4 mg total) under the tongue every 5 (five) minutes as needed for chest pain. 25 tablet 3  . potassium chloride SA (K-DUR,KLOR-CON) 20 MEQ tablet TAKE 2 TABLETS BY MOUTH TWICE DAILY. 120 tablet 0  . rivaroxaban (XARELTO) 20 MG TABS tablet Take 1 tablet (20 mg total) by mouth daily. 30 tablet 3  . sodium bicarbonate 650 MG tablet Take 1,300 mg by mouth 3 (three) times daily.    Marland Kitchen sulfamethoxazole-trimethoprim (BACTRIM DS,SEPTRA DS) 800-160 MG tablet TAKE ONE TABLET BY MOUTH EVERY MONDAY, WEDNESDAY, AND FRIDAY. 12 tablet 5  .  torsemide (DEMADEX) 20 MG tablet TAKE 2 TABLETS BY MOUTH EACH MORNING AND 1 TABLET EACH EVENING. 240 tablet 3  . Vitamin D, Ergocalciferol, (DRISDOL) 50000 units CAPS capsule Take 1 capsule (50,000 Units total) by mouth every 30 (thirty) days. 6 capsule 1   No current facility-administered medications for this visit.    Facility-Administered Medications Ordered in Other Visits  Medication Dose Route Frequency Provider Last Rate Last Dose  . sodium chloride 0.9 % injection 10 mL  10 mL Intravenous Once Farrel Gobble, MD        Allergies:   Review of patient's allergies indicates no known allergies.    Social History:  The patient  reports that he quit smoking about 14 years ago. His smoking use included Cigarettes and Cigars. He has a 0.25 pack-year smoking history. He has never used smokeless tobacco. He reports that he does not drink alcohol or use drugs.   Family History:  The patient's family history includes Cancer in his mother; Diabetes in his father; Heart disease in his mother.    ROS:  Please see the history of present illness.   Otherwise, review of systems are positive for {NONE DEFAULTED:18576::"none"}.   All other systems are reviewed and negative.    PHYSICAL EXAM: VS:  There  were no vitals taken for this visit. , BMI There is no height or weight on file to calculate BMI. Affect appropriate Healthy:  appears stated age 45: normal Neck supple with no adenopathy JVP normal no bruits no thyromegaly Lungs clear with no wheezing and good diaphragmatic motion Heart:  S1/S2 no murmur, no rub, gallop or click PMI normal Abdomen: benighn, BS positve, no tenderness, no AAA no bruit.  No HSM or HJR Distal pulses intact with no bruits No edema Neuro non-focal Skin warm and dry No muscular weakness    EKG:  SB rate 47 poor R wave progression 03/11/16     Recent Labs: 03/06/2016: ALT 87; BUN 30; Creatinine, Ser 1.56; Hemoglobin 10.3; Platelets 137; Potassium 3.1; Sodium 140    Lipid Panel    Component Value Date/Time   CHOL 158 07/18/2010 1724   TRIG 173 07/18/2010 1724   HDL 33 07/18/2010 1724   LDLCALC 90 07/18/2010 1724      Wt Readings from Last 3 Encounters:  03/11/16 (!) 165.6 kg (365 lb)  03/06/16 (!) 169 kg (372 lb 9.6 oz)  02/26/16 (!) 167.2 kg (368 lb 11.2 oz)      Other studies Reviewed: Additional studies/ records that were reviewed today include: ***.    ASSESSMENT AND PLAN:  1.  ***   Current medicines are reviewed at length with the patient today.  The patient {ACTIONS; HAS/DOES NOT HAVE:19233} concerns regarding medicines.  The following changes have been made:  {PLAN; NO CHANGE:13088:s}  Labs/ tests ordered today include: *** No orders of the defined types were placed in this encounter.    Disposition:   FU with ***     Signed, Jenkins Rouge, MD  03/24/2016 8:08 AM    [facility] [place], [place], [place]  [address] Phone: [phone]; Fax: [phone]

## 2016-03-29 ENCOUNTER — Other Ambulatory Visit (HOSPITAL_COMMUNITY): Payer: Self-pay | Admitting: Oncology

## 2016-03-29 ENCOUNTER — Other Ambulatory Visit: Payer: Self-pay | Admitting: Internal Medicine

## 2016-04-14 ENCOUNTER — Other Ambulatory Visit (INDEPENDENT_AMBULATORY_CARE_PROVIDER_SITE_OTHER): Payer: Self-pay | Admitting: Internal Medicine

## 2016-04-14 ENCOUNTER — Other Ambulatory Visit: Payer: Self-pay | Admitting: Cardiovascular Disease

## 2016-04-14 ENCOUNTER — Other Ambulatory Visit (HOSPITAL_COMMUNITY): Payer: Self-pay | Admitting: Oncology

## 2016-04-16 ENCOUNTER — Other Ambulatory Visit (HOSPITAL_COMMUNITY): Payer: Self-pay | Admitting: Oncology

## 2016-04-16 DIAGNOSIS — C9001 Multiple myeloma in remission: Secondary | ICD-10-CM

## 2016-04-16 MED ORDER — LENALIDOMIDE 5 MG PO CAPS: 5.0000 mg | ORAL_CAPSULE | Freq: Every day | ORAL | 0 refills | Status: DC

## 2016-04-17 ENCOUNTER — Other Ambulatory Visit (HOSPITAL_COMMUNITY): Payer: Managed Care, Other (non HMO)

## 2016-04-19 ENCOUNTER — Other Ambulatory Visit (INDEPENDENT_AMBULATORY_CARE_PROVIDER_SITE_OTHER): Payer: Self-pay | Admitting: Internal Medicine

## 2016-04-21 ENCOUNTER — Encounter (HOSPITAL_COMMUNITY): Payer: Managed Care, Other (non HMO) | Attending: Oncology

## 2016-04-21 DIAGNOSIS — Z452 Encounter for adjustment and management of vascular access device: Secondary | ICD-10-CM | POA: Diagnosis not present

## 2016-04-21 DIAGNOSIS — C9001 Multiple myeloma in remission: Secondary | ICD-10-CM

## 2016-04-21 DIAGNOSIS — C9000 Multiple myeloma not having achieved remission: Secondary | ICD-10-CM | POA: Diagnosis present

## 2016-04-21 LAB — CBC WITH DIFFERENTIAL/PLATELET
Basophils Absolute: 0 10*3/uL (ref 0.0–0.1)
Basophils Relative: 0 %
Eosinophils Absolute: 0.1 10*3/uL (ref 0.0–0.7)
Eosinophils Relative: 1 %
HCT: 32.8 % — ABNORMAL LOW (ref 39.0–52.0)
Hemoglobin: 10.6 g/dL — ABNORMAL LOW (ref 13.0–17.0)
Lymphocytes Relative: 42 %
Lymphs Abs: 4.2 10*3/uL — ABNORMAL HIGH (ref 0.7–4.0)
MCH: 30.8 pg (ref 26.0–34.0)
MCHC: 32.3 g/dL (ref 30.0–36.0)
MCV: 95.3 fL (ref 78.0–100.0)
Monocytes Absolute: 1.6 10*3/uL — ABNORMAL HIGH (ref 0.1–1.0)
Monocytes Relative: 16 %
Neutro Abs: 4.1 10*3/uL (ref 1.7–7.7)
Neutrophils Relative %: 41 %
Platelets: 149 10*3/uL — ABNORMAL LOW (ref 150–400)
RBC: 3.44 MIL/uL — ABNORMAL LOW (ref 4.22–5.81)
RDW: 15.6 % — ABNORMAL HIGH (ref 11.5–15.5)
WBC: 10 10*3/uL (ref 4.0–10.5)

## 2016-04-21 LAB — COMPREHENSIVE METABOLIC PANEL
ALT: 97 U/L — ABNORMAL HIGH (ref 17–63)
AST: 98 U/L — ABNORMAL HIGH (ref 15–41)
Albumin: 3.3 g/dL — ABNORMAL LOW (ref 3.5–5.0)
Alkaline Phosphatase: 100 U/L (ref 38–126)
Anion gap: 6 (ref 5–15)
BUN: 40 mg/dL — ABNORMAL HIGH (ref 6–20)
CO2: 19 mmol/L — ABNORMAL LOW (ref 22–32)
Calcium: 8.4 mg/dL — ABNORMAL LOW (ref 8.9–10.3)
Chloride: 114 mmol/L — ABNORMAL HIGH (ref 101–111)
Creatinine, Ser: 1.63 mg/dL — ABNORMAL HIGH (ref 0.61–1.24)
GFR calc Af Amer: 52 mL/min — ABNORMAL LOW (ref >60)
GFR calc non Af Amer: 45 mL/min — ABNORMAL LOW (ref >60)
Glucose, Bld: 60 mg/dL — ABNORMAL LOW (ref 65–99)
Potassium: 3.1 mmol/L — ABNORMAL LOW (ref 3.5–5.1)
Sodium: 139 mmol/L (ref 135–145)
Total Bilirubin: 0.7 mg/dL (ref 0.3–1.2)
Total Protein: 5.8 g/dL — ABNORMAL LOW (ref 6.5–8.1)

## 2016-04-21 MED ORDER — HEPARIN SOD (PORK) LOCK FLUSH 100 UNIT/ML IV SOLN
INTRAVENOUS | Status: AC
  Filled 2016-04-21: qty 5

## 2016-04-21 MED ORDER — SODIUM CHLORIDE 0.9% FLUSH
20.0000 mL | INTRAVENOUS | Status: DC | PRN
  Administered 2016-04-21: 20 mL via INTRAVENOUS
  Filled 2016-04-21: qty 20

## 2016-04-21 MED ORDER — HEPARIN SOD (PORK) LOCK FLUSH 100 UNIT/ML IV SOLN
500.0000 [IU] | Freq: Once | INTRAVENOUS | Status: AC
  Administered 2016-04-21: 500 [IU] via INTRAVENOUS

## 2016-04-21 NOTE — Patient Instructions (Signed)
[facility] Discharge Instructions  RECOMMENDATIONS MADE BY THE CONSULTANT AND ANY TEST RESULTS WILL BE SENT TO YOUR REFERRING PHYSICIAN. Port lab draw today with port flushed per protocol. Follow-up as scheduled. Call clinic for any questions or concerns  Thank you for choosing [facility] at [facility] to provide your oncology and hematology care.  To afford each patient quality time with our provider, please arrive at least 15 minutes before your scheduled appointment time.   Beginning January 23rd 2017 lab work for the Cancer Center will be done in the  Main lab at [place] on 1st floor. If you have a lab appointment with the Cancer Center please come in thru the  Main Entrance and check in at the main information desk  You need to re-schedule your appointment should you arrive 10 or more minutes late.  We strive to give you quality time with our providers, and arriving late affects you and other patients whose appointments are after yours.  Also, if you no show three or more times for appointments you may be dismissed from the clinic at the providers discretion.     Again, thank you for choosing [facility].  Our hope is that these requests will decrease the amount of time that you wait before being seen by our physicians.       _____________________________________________________________  Should you have questions after your visit to  Cancer Center, please contact our office at [phone] between the hours of 8:30 a.m. and 4:30 p.m.  Voicemails left after 4:30 p.m. will not be returned until the following business day.  For prescription refill requests, have your pharmacy contact our office.         Resources For Cancer Patients and their Caregivers ? American Cancer Society: Can assist with transportation, wigs, general needs, runs Look Good Feel Better.        1-888-227-6333 ? Cancer  Care: Provides financial assistance, online support groups, medication/co-pay assistance.  1-800-813-HOPE (4673) ? [facility] Assists [place] cancer patients and their families through emotional , educational and financial support.  336-427-4357 ? [place] DSS Where to apply for food stamps, Medicaid and utility assistance. 336-342-1394 ? [facility]: Transportation to medical appointments. 336-347-2287 ? Social Security Administration: May apply for disability if have a Stage IV cancer. 336-342-7796 1-800-772-1213 ? [place] Aging, Disability and Transit Services: Assists with nutrition, care and transit needs. 336-349-2343  Cancer Center Support Programs: @[email or web address]@ > Cancer Support Group  2nd Tuesday of the month 1pm-2pm, Journey Room  > Creative Journey  3rd Tuesday of the month 1130am-1pm, Journey Room  > Look Good Feel Better  1st Wednesday of the month 10am-12 noon, Journey Room (Call American Cancer Society to register 1-800-395-5775)   

## 2016-04-21 NOTE — Progress Notes (Signed)
[name] tolerated port lab draw well without complaints or incident. Port flushed with 20 ml NS and 5 ml Heparin easily per protocol. VSS.Pt discharged self ambulatory using cane in satisfactory condition

## 2016-04-22 LAB — PROTEIN ELECTROPHORESIS, SERUM
A/G Ratio: 1.5 (ref 0.7–1.7)
Albumin ELP: 3 g/dL (ref 2.9–4.4)
Alpha-1-Globulin: 0.2 g/dL (ref 0.0–0.4)
Alpha-2-Globulin: 0.7 g/dL (ref 0.4–1.0)
Beta Globulin: 0.8 g/dL (ref 0.7–1.3)
Gamma Globulin: 0.3 g/dL — ABNORMAL LOW (ref 0.4–1.8)
Globulin, Total: 2 g/dL — ABNORMAL LOW (ref 2.2–3.9)
Total Protein ELP: 5 g/dL — ABNORMAL LOW (ref 6.0–8.5)

## 2016-04-22 LAB — IMMUNOFIXATION ELECTROPHORESIS
IgA: 53 mg/dL — ABNORMAL LOW (ref 90–386)
IgG (Immunoglobin G), Serum: 276 mg/dL — ABNORMAL LOW (ref 700–1600)
IgM, Serum: 37 mg/dL (ref 20–172)
Total Protein ELP: 5.1 g/dL — ABNORMAL LOW (ref 6.0–8.5)

## 2016-04-22 LAB — KAPPA/LAMBDA LIGHT CHAINS
Kappa free light chain: 18.4 mg/L (ref 3.3–19.4)
Kappa, lambda light chain ratio: 1.11 (ref 0.26–1.65)
Lambda free light chains: 16.6 mg/L (ref 5.7–26.3)

## 2016-04-22 LAB — IGG, IGA, IGM
IgA: 54 mg/dL — ABNORMAL LOW (ref 90–386)
IgG (Immunoglobin G), Serum: 277 mg/dL — ABNORMAL LOW (ref 700–1600)
IgM, Serum: 41 mg/dL (ref 20–172)

## 2016-05-06 ENCOUNTER — Ambulatory Visit (HOSPITAL_COMMUNITY): Payer: Managed Care, Other (non HMO) | Admitting: Oncology

## 2016-05-13 ENCOUNTER — Other Ambulatory Visit (HOSPITAL_COMMUNITY): Payer: Self-pay | Admitting: Oncology

## 2016-05-13 DIAGNOSIS — C9001 Multiple myeloma in remission: Secondary | ICD-10-CM

## 2016-05-13 MED ORDER — LENALIDOMIDE 5 MG PO CAPS: 5.0000 mg | ORAL_CAPSULE | Freq: Every day | ORAL | 0 refills | Status: DC

## 2016-05-15 ENCOUNTER — Encounter (HOSPITAL_COMMUNITY): Payer: Self-pay

## 2016-05-24 ENCOUNTER — Other Ambulatory Visit (HOSPITAL_COMMUNITY): Payer: Self-pay | Admitting: Oncology

## 2016-05-24 ENCOUNTER — Other Ambulatory Visit (INDEPENDENT_AMBULATORY_CARE_PROVIDER_SITE_OTHER): Payer: Self-pay | Admitting: Internal Medicine

## 2016-05-24 ENCOUNTER — Other Ambulatory Visit: Payer: Self-pay | Admitting: Cardiovascular Disease

## 2016-06-05 ENCOUNTER — Other Ambulatory Visit (HOSPITAL_COMMUNITY): Payer: Self-pay | Admitting: Oncology

## 2016-06-05 DIAGNOSIS — C9001 Multiple myeloma in remission: Secondary | ICD-10-CM

## 2016-06-09 ENCOUNTER — Other Ambulatory Visit (HOSPITAL_COMMUNITY): Payer: Self-pay | Admitting: Oncology

## 2016-06-09 DIAGNOSIS — C9001 Multiple myeloma in remission: Secondary | ICD-10-CM

## 2016-06-09 MED ORDER — LENALIDOMIDE 5 MG PO CAPS: ORAL_CAPSULE | ORAL | 0 refills | Status: DC

## 2016-06-10 ENCOUNTER — Encounter (HOSPITAL_COMMUNITY): Payer: Self-pay | Admitting: Emergency Medicine

## 2016-06-10 ENCOUNTER — Emergency Department (HOSPITAL_COMMUNITY): Payer: BLUE CROSS/BLUE SHIELD

## 2016-06-10 ENCOUNTER — Inpatient Hospital Stay (HOSPITAL_COMMUNITY)
Admission: EM | Admit: 2016-06-10 | Discharge: 2016-06-12 | DRG: 871 | Disposition: A | Discharge status: Home / Self Care | Payer: BLUE CROSS/BLUE SHIELD | Attending: Internal Medicine | Admitting: Internal Medicine

## 2016-06-10 DIAGNOSIS — K219 Gastro-esophageal reflux disease without esophagitis: Secondary | ICD-10-CM | POA: Diagnosis present

## 2016-06-10 DIAGNOSIS — R0602 Shortness of breath: Secondary | ICD-10-CM

## 2016-06-10 DIAGNOSIS — Z9989 Dependence on other enabling machines and devices: Secondary | ICD-10-CM | POA: Diagnosis present

## 2016-06-10 DIAGNOSIS — E11622 Type 2 diabetes mellitus with other skin ulcer: Secondary | ICD-10-CM | POA: Diagnosis present

## 2016-06-10 DIAGNOSIS — L03115 Cellulitis of right lower limb: Secondary | ICD-10-CM | POA: Diagnosis present

## 2016-06-10 DIAGNOSIS — C9000 Multiple myeloma not having achieved remission: Secondary | ICD-10-CM | POA: Diagnosis present

## 2016-06-10 DIAGNOSIS — J181 Lobar pneumonia, unspecified organism: Secondary | ICD-10-CM | POA: Diagnosis not present

## 2016-06-10 DIAGNOSIS — J189 Pneumonia, unspecified organism: Secondary | ICD-10-CM | POA: Diagnosis present

## 2016-06-10 DIAGNOSIS — E119 Type 2 diabetes mellitus without complications: Secondary | ICD-10-CM | POA: Diagnosis not present

## 2016-06-10 DIAGNOSIS — J09X1 Influenza due to identified novel influenza A virus with pneumonia: Secondary | ICD-10-CM | POA: Diagnosis present

## 2016-06-10 DIAGNOSIS — K861 Other chronic pancreatitis: Secondary | ICD-10-CM | POA: Diagnosis present

## 2016-06-10 DIAGNOSIS — E118 Type 2 diabetes mellitus with unspecified complications: Secondary | ICD-10-CM

## 2016-06-10 DIAGNOSIS — I5022 Chronic systolic (congestive) heart failure: Secondary | ICD-10-CM | POA: Diagnosis present

## 2016-06-10 DIAGNOSIS — K529 Noninfective gastroenteritis and colitis, unspecified: Secondary | ICD-10-CM | POA: Diagnosis present

## 2016-06-10 DIAGNOSIS — G4733 Obstructive sleep apnea (adult) (pediatric): Secondary | ICD-10-CM | POA: Diagnosis present

## 2016-06-10 DIAGNOSIS — E785 Hyperlipidemia, unspecified: Secondary | ICD-10-CM | POA: Diagnosis present

## 2016-06-10 DIAGNOSIS — Z9221 Personal history of antineoplastic chemotherapy: Secondary | ICD-10-CM

## 2016-06-10 DIAGNOSIS — I4892 Unspecified atrial flutter: Secondary | ICD-10-CM | POA: Diagnosis present

## 2016-06-10 DIAGNOSIS — I130 Hypertensive heart and chronic kidney disease with heart failure and stage 1 through stage 4 chronic kidney disease, or unspecified chronic kidney disease: Secondary | ICD-10-CM | POA: Diagnosis present

## 2016-06-10 DIAGNOSIS — M109 Gout, unspecified: Secondary | ICD-10-CM | POA: Diagnosis present

## 2016-06-10 DIAGNOSIS — Z79891 Long term (current) use of opiate analgesic: Secondary | ICD-10-CM

## 2016-06-10 DIAGNOSIS — L97929 Non-pressure chronic ulcer of unspecified part of left lower leg with unspecified severity: Secondary | ICD-10-CM | POA: Diagnosis present

## 2016-06-10 DIAGNOSIS — Z87891 Personal history of nicotine dependence: Secondary | ICD-10-CM

## 2016-06-10 DIAGNOSIS — L03116 Cellulitis of left lower limb: Secondary | ICD-10-CM | POA: Diagnosis present

## 2016-06-10 DIAGNOSIS — Z794 Long term (current) use of insulin: Secondary | ICD-10-CM | POA: Diagnosis not present

## 2016-06-10 DIAGNOSIS — E1122 Type 2 diabetes mellitus with diabetic chronic kidney disease: Secondary | ICD-10-CM | POA: Diagnosis present

## 2016-06-10 DIAGNOSIS — Z833 Family history of diabetes mellitus: Secondary | ICD-10-CM

## 2016-06-10 DIAGNOSIS — I252 Old myocardial infarction: Secondary | ICD-10-CM | POA: Diagnosis not present

## 2016-06-10 DIAGNOSIS — G4730 Sleep apnea, unspecified: Secondary | ICD-10-CM | POA: Diagnosis present

## 2016-06-10 DIAGNOSIS — IMO0001 Reserved for inherently not codable concepts without codable children: Secondary | ICD-10-CM

## 2016-06-10 DIAGNOSIS — N390 Urinary tract infection, site not specified: Secondary | ICD-10-CM | POA: Diagnosis present

## 2016-06-10 DIAGNOSIS — N184 Chronic kidney disease, stage 4 (severe): Secondary | ICD-10-CM | POA: Diagnosis present

## 2016-06-10 DIAGNOSIS — Z955 Presence of coronary angioplasty implant and graft: Secondary | ICD-10-CM | POA: Diagnosis not present

## 2016-06-10 DIAGNOSIS — I2510 Atherosclerotic heart disease of native coronary artery without angina pectoris: Secondary | ICD-10-CM | POA: Diagnosis present

## 2016-06-10 DIAGNOSIS — N179 Acute kidney failure, unspecified: Secondary | ICD-10-CM | POA: Diagnosis present

## 2016-06-10 DIAGNOSIS — Z6841 Body Mass Index (BMI) 40.0 and over, adult: Secondary | ICD-10-CM

## 2016-06-10 DIAGNOSIS — L03119 Cellulitis of unspecified part of limb: Secondary | ICD-10-CM

## 2016-06-10 DIAGNOSIS — R2689 Other abnormalities of gait and mobility: Secondary | ICD-10-CM

## 2016-06-10 DIAGNOSIS — Z8249 Family history of ischemic heart disease and other diseases of the circulatory system: Secondary | ICD-10-CM

## 2016-06-10 DIAGNOSIS — A419 Sepsis, unspecified organism: Principal | ICD-10-CM | POA: Diagnosis present

## 2016-06-10 DIAGNOSIS — Z7901 Long term (current) use of anticoagulants: Secondary | ICD-10-CM

## 2016-06-10 DIAGNOSIS — I10 Essential (primary) hypertension: Secondary | ICD-10-CM | POA: Diagnosis present

## 2016-06-10 DIAGNOSIS — Z809 Family history of malignant neoplasm, unspecified: Secondary | ICD-10-CM

## 2016-06-10 LAB — CBC WITH DIFFERENTIAL/PLATELET
Basophils Absolute: 0 10*3/uL (ref 0.0–0.1)
Basophils Relative: 0 %
Eosinophils Absolute: 0.2 10*3/uL (ref 0.0–0.7)
Eosinophils Relative: 3 %
HCT: 34.6 % — ABNORMAL LOW (ref 39.0–52.0)
Hemoglobin: 11.4 g/dL — ABNORMAL LOW (ref 13.0–17.0)
Lymphocytes Relative: 9 %
Lymphs Abs: 0.8 10*3/uL (ref 0.7–4.0)
MCH: 31.9 pg (ref 26.0–34.0)
MCHC: 32.9 g/dL (ref 30.0–36.0)
MCV: 96.9 fL (ref 78.0–100.0)
Monocytes Absolute: 0.6 10*3/uL (ref 0.1–1.0)
Monocytes Relative: 8 %
Neutro Abs: 6.9 10*3/uL (ref 1.7–7.7)
Neutrophils Relative %: 80 %
Platelets: 136 10*3/uL — ABNORMAL LOW (ref 150–400)
RBC: 3.57 MIL/uL — ABNORMAL LOW (ref 4.22–5.81)
RDW: 15.6 % — ABNORMAL HIGH (ref 11.5–15.5)
WBC: 8.6 10*3/uL (ref 4.0–10.5)

## 2016-06-10 LAB — GLUCOSE, CAPILLARY: Glucose-Capillary: 234 mg/dL — ABNORMAL HIGH (ref 65–99)

## 2016-06-10 LAB — URINALYSIS, MICROSCOPIC (REFLEX): Squamous Epithelial / LPF: NONE SEEN

## 2016-06-10 LAB — URINALYSIS, ROUTINE W REFLEX MICROSCOPIC
Bilirubin Urine: NEGATIVE
Glucose, UA: NEGATIVE mg/dL
Ketones, ur: NEGATIVE mg/dL
Nitrite: NEGATIVE
Protein, ur: 100 mg/dL — AB
Specific Gravity, Urine: 1.015 (ref 1.005–1.030)
pH: 5.5 (ref 5.0–8.0)

## 2016-06-10 LAB — COMPREHENSIVE METABOLIC PANEL
ALT: 41 U/L (ref 17–63)
AST: 33 U/L (ref 15–41)
Albumin: 3.4 g/dL — ABNORMAL LOW (ref 3.5–5.0)
Alkaline Phosphatase: 89 U/L (ref 38–126)
Anion gap: 9 (ref 5–15)
BUN: 48 mg/dL — ABNORMAL HIGH (ref 6–20)
CO2: 24 mmol/L (ref 22–32)
Calcium: 8.5 mg/dL — ABNORMAL LOW (ref 8.9–10.3)
Chloride: 102 mmol/L (ref 101–111)
Creatinine, Ser: 1.96 mg/dL — ABNORMAL HIGH (ref 0.61–1.24)
GFR calc Af Amer: 42 mL/min — ABNORMAL LOW (ref >60)
GFR calc non Af Amer: 36 mL/min — ABNORMAL LOW (ref >60)
Glucose, Bld: 132 mg/dL — ABNORMAL HIGH (ref 65–99)
Potassium: 3.9 mmol/L (ref 3.5–5.1)
Sodium: 135 mmol/L (ref 135–145)
Total Bilirubin: 0.8 mg/dL (ref 0.3–1.2)
Total Protein: 5.9 g/dL — ABNORMAL LOW (ref 6.5–8.1)

## 2016-06-10 LAB — CBG MONITORING, ED: Glucose-Capillary: 198 mg/dL — ABNORMAL HIGH (ref 65–99)

## 2016-06-10 LAB — LACTIC ACID, PLASMA
Lactic Acid, Venous: 1.8 mmol/L (ref 0.5–1.9)
Lactic Acid, Venous: 0.9 mmol/L (ref 0.5–1.9)

## 2016-06-10 MED ORDER — SODIUM CHLORIDE 0.9 % IV BOLUS (SEPSIS)
500.0000 mL | Freq: Once | INTRAVENOUS | Status: AC
  Administered 2016-06-10: 500 mL via INTRAVENOUS

## 2016-06-10 MED ORDER — CHOLESTYRAMINE 4 G PO PACK
4.0000 g | PACK | Freq: Two times a day (BID) | ORAL | Status: DC
  Administered 2016-06-10 – 2016-06-12 (×4): 4 g via ORAL
  Filled 2016-06-10 (×6): qty 1

## 2016-06-10 MED ORDER — SODIUM BICARBONATE 650 MG PO TABS
1300.0000 mg | ORAL_TABLET | Freq: Three times a day (TID) | ORAL | Status: DC
  Administered 2016-06-10 – 2016-06-12 (×6): 1300 mg via ORAL
  Filled 2016-06-10 (×8): qty 2

## 2016-06-10 MED ORDER — GUAIFENESIN-CODEINE 100-10 MG/5ML PO SOLN
5.0000 mL | Freq: Once | ORAL | Status: AC
  Administered 2016-06-10: 5 mL via ORAL
  Filled 2016-06-10: qty 5

## 2016-06-10 MED ORDER — NITROGLYCERIN 0.4 MG SL SUBL
0.4000 mg | SUBLINGUAL_TABLET | SUBLINGUAL | Status: DC | PRN
Start: 2016-06-10 — End: 2016-06-12

## 2016-06-10 MED ORDER — SODIUM CHLORIDE 0.9 % IV BOLUS (SEPSIS)
1000.0000 mL | Freq: Once | INTRAVENOUS | Status: AC
  Administered 2016-06-10: 1000 mL via INTRAVENOUS

## 2016-06-10 MED ORDER — DEXTROSE 5 % IV SOLN: 1.0000 g | INTRAVENOUS | Status: DC

## 2016-06-10 MED ORDER — DICYCLOMINE HCL 10 MG PO CAPS
20.0000 mg | ORAL_CAPSULE | Freq: Three times a day (TID) | ORAL | Status: DC
  Administered 2016-06-10 – 2016-06-12 (×6): 20 mg via ORAL
  Filled 2016-06-10 (×6): qty 2

## 2016-06-10 MED ORDER — AZITHROMYCIN 250 MG PO TABS: 500.0000 mg | ORAL_TABLET | Freq: Every day | ORAL | Status: DC

## 2016-06-10 MED ORDER — DEXTROSE 5 % IV SOLN
1.0000 g | Freq: Once | INTRAVENOUS | Status: AC
  Administered 2016-06-10: 1 g via INTRAVENOUS
  Filled 2016-06-10: qty 10

## 2016-06-10 MED ORDER — PANCRELIPASE (LIP-PROT-AMYL) 36000-114000 UNITS PO CPEP
36000.0000 [IU] | ORAL_CAPSULE | Freq: Three times a day (TID) | ORAL | Status: DC
  Administered 2016-06-10: 36000 [IU] via ORAL
  Filled 2016-06-10 (×3): qty 1

## 2016-06-10 MED ORDER — ONDANSETRON HCL 4 MG PO TABS: 4.0000 mg | ORAL_TABLET | Freq: Four times a day (QID) | ORAL | Status: DC | PRN

## 2016-06-10 MED ORDER — IPRATROPIUM-ALBUTEROL 0.5-2.5 (3) MG/3ML IN SOLN
3.0000 mL | RESPIRATORY_TRACT | Status: DC | PRN
  Administered 2016-06-10: 3 mL via RESPIRATORY_TRACT
  Filled 2016-06-10: qty 3

## 2016-06-10 MED ORDER — POTASSIUM CHLORIDE CRYS ER 20 MEQ PO TBCR
40.0000 meq | EXTENDED_RELEASE_TABLET | Freq: Two times a day (BID) | ORAL | Status: DC
  Administered 2016-06-10 – 2016-06-12 (×4): 40 meq via ORAL
  Filled 2016-06-10 (×4): qty 2

## 2016-06-10 MED ORDER — DEXTROSE 5 % IV SOLN
500.0000 mg | INTRAVENOUS | Status: DC
  Administered 2016-06-10 – 2016-06-11 (×2): 500 mg via INTRAVENOUS
  Filled 2016-06-10 (×3): qty 500

## 2016-06-10 MED ORDER — METOPROLOL SUCCINATE ER 25 MG PO TB24
25.0000 mg | ORAL_TABLET | Freq: Every day | ORAL | Status: DC
  Filled 2016-06-10: qty 1

## 2016-06-10 MED ORDER — HYDROCODONE-ACETAMINOPHEN 5-325 MG PO TABS
1.0000 | ORAL_TABLET | ORAL | Status: DC | PRN
  Administered 2016-06-10 – 2016-06-12 (×3): 2 via ORAL
  Filled 2016-06-10 (×3): qty 2

## 2016-06-10 MED ORDER — SULFAMETHOXAZOLE-TRIMETHOPRIM 800-160 MG PO TABS
1.0000 | ORAL_TABLET | ORAL | Status: DC
  Administered 2016-06-11: 1 via ORAL
  Filled 2016-06-10: qty 1

## 2016-06-10 MED ORDER — ACETAMINOPHEN 650 MG RE SUPP: 650.0000 mg | Freq: Four times a day (QID) | RECTAL | Status: DC | PRN

## 2016-06-10 MED ORDER — ACETAMINOPHEN 500 MG PO TABS
1000.0000 mg | ORAL_TABLET | Freq: Once | ORAL | Status: AC
  Administered 2016-06-10: 1000 mg via ORAL
  Filled 2016-06-10: qty 2

## 2016-06-10 MED ORDER — ACYCLOVIR 400 MG PO TABS
400.0000 mg | ORAL_TABLET | Freq: Every morning | ORAL | Status: DC
  Filled 2016-06-10: qty 1

## 2016-06-10 MED ORDER — RIVAROXABAN 20 MG PO TABS
20.0000 mg | ORAL_TABLET | Freq: Every day | ORAL | Status: DC
  Administered 2016-06-10 – 2016-06-11 (×2): 20 mg via ORAL
  Filled 2016-06-10 (×2): qty 1

## 2016-06-10 MED ORDER — ONDANSETRON HCL 4 MG/2ML IJ SOLN: 4.0000 mg | Freq: Four times a day (QID) | INTRAMUSCULAR | Status: DC | PRN

## 2016-06-10 MED ORDER — INSULIN ASPART 100 UNIT/ML ~~LOC~~ SOLN
0.0000 [IU] | Freq: Three times a day (TID) | SUBCUTANEOUS | Status: DC
  Administered 2016-06-10 – 2016-06-11 (×2): 3 [IU] via SUBCUTANEOUS
  Administered 2016-06-11: 11 [IU] via SUBCUTANEOUS
  Administered 2016-06-11: 3 [IU] via SUBCUTANEOUS
  Filled 2016-06-10: qty 1

## 2016-06-10 MED ORDER — ATORVASTATIN CALCIUM 40 MG PO TABS
80.0000 mg | ORAL_TABLET | Freq: Every day | ORAL | Status: DC
  Administered 2016-06-10 – 2016-06-11 (×2): 80 mg via ORAL
  Filled 2016-06-10 (×3): qty 2

## 2016-06-10 MED ORDER — ALLOPURINOL 300 MG PO TABS
300.0000 mg | ORAL_TABLET | Freq: Every day | ORAL | Status: DC
  Administered 2016-06-11 – 2016-06-12 (×2): 300 mg via ORAL
  Filled 2016-06-10 (×3): qty 1

## 2016-06-10 MED ORDER — SODIUM CHLORIDE 0.9 % IV SOLN
Freq: Once | INTRAVENOUS | Status: AC
  Administered 2016-06-10: 15:00:00 via INTRAVENOUS

## 2016-06-10 MED ORDER — PANTOPRAZOLE SODIUM 40 MG PO TBEC
40.0000 mg | DELAYED_RELEASE_TABLET | Freq: Every day | ORAL | Status: DC
  Administered 2016-06-11 – 2016-06-12 (×2): 40 mg via ORAL
  Filled 2016-06-10 (×2): qty 1

## 2016-06-10 MED ORDER — CALCITRIOL 0.25 MCG PO CAPS
0.2500 ug | ORAL_CAPSULE | ORAL | Status: DC
  Administered 2016-06-11: 0.25 ug via ORAL
  Filled 2016-06-10 (×2): qty 1

## 2016-06-10 MED ORDER — DIPHENOXYLATE-ATROPINE 2.5-0.025 MG PO TABS
1.0000 | ORAL_TABLET | Freq: Two times a day (BID) | ORAL | Status: DC
  Administered 2016-06-10 – 2016-06-12 (×4): 1 via ORAL
  Filled 2016-06-10 (×4): qty 1

## 2016-06-10 MED ORDER — INSULIN ASPART 100 UNIT/ML ~~LOC~~ SOLN
0.0000 [IU] | Freq: Every day | SUBCUTANEOUS | Status: DC
  Administered 2016-06-10: 2 [IU] via SUBCUTANEOUS

## 2016-06-10 MED ORDER — LORATADINE 10 MG PO TABS
10.0000 mg | ORAL_TABLET | Freq: Every morning | ORAL | Status: DC
  Administered 2016-06-11 – 2016-06-12 (×2): 10 mg via ORAL
  Filled 2016-06-10 (×2): qty 1

## 2016-06-10 MED ORDER — INSULIN DETEMIR 100 UNIT/ML ~~LOC~~ SOLN
40.0000 [IU] | Freq: Every morning | SUBCUTANEOUS | Status: DC
  Administered 2016-06-11: 40 [IU] via SUBCUTANEOUS
  Filled 2016-06-10 (×3): qty 0.4

## 2016-06-10 MED ORDER — ADULT MULTIVITAMIN W/MINERALS CH
1.0000 | ORAL_TABLET | Freq: Every day | ORAL | Status: DC
  Administered 2016-06-11 – 2016-06-12 (×2): 1 via ORAL
  Filled 2016-06-10 (×2): qty 1

## 2016-06-10 MED ORDER — CALCIUM CARBONATE 1250 (500 CA) MG PO TABS
1800.0000 mg | ORAL_TABLET | Freq: Two times a day (BID) | ORAL | Status: DC
  Administered 2016-06-10 – 2016-06-12 (×4): 1875 mg via ORAL
  Filled 2016-06-10: qty 1.5
  Filled 2016-06-10 (×2): qty 2
  Filled 2016-06-10: qty 1.5
  Filled 2016-06-10: qty 2

## 2016-06-10 MED ORDER — DEXTROSE 5 % IV SOLN
2.0000 g | INTRAVENOUS | Status: DC
  Administered 2016-06-11: 2 g via INTRAVENOUS
  Filled 2016-06-10 (×2): qty 2

## 2016-06-10 MED ORDER — PANCRELIPASE (LIP-PROT-AMYL) 36000-114000 UNITS PO CPEP
180000.0000 [IU] | ORAL_CAPSULE | Freq: Three times a day (TID) | ORAL | Status: DC
  Administered 2016-06-11 – 2016-06-12 (×5): 180000 [IU] via ORAL
  Filled 2016-06-10 (×5): qty 5

## 2016-06-10 MED ORDER — ACETAMINOPHEN 325 MG PO TABS
650.0000 mg | ORAL_TABLET | Freq: Four times a day (QID) | ORAL | Status: DC | PRN
  Administered 2016-06-11: 650 mg via ORAL
  Filled 2016-06-10: qty 2

## 2016-06-10 NOTE — H&P (Signed)
History and Physical    TEJ MURDAUGH XFG:[id number] DOB: 11-12-1960 DOA: 06/10/2016  PCP: [name], MD  Patient coming from: Home  Chief Complaint:Fever, cough and lower extremity wound and pain.  HPI: [name] is a 56 y.o. male with medical history significant of morbidly obese, insulin-dependent diabetes, hypertension, chronic diarrhea, chronic kidney disease is stage III, coronary artery disease status post stent in the past, atrial flutter, hyperlipidemia, acid reflux, multiple myeloma status post chemotherapy, chronic lower extremity lymphedema and wounds follow-up outpatient, obstructive sleep apnea uses CPAP presented with worsening lower extremity pain associated with weeping for about a week. Patient also with fever, chills or shortness of breath and productive cough worsened for last 2 days. The cough has greenish phlegm. Associated with abdominal wall pain increases when coughing. Also has sore throat, runny nose. Denied sick contact or recent travel. Denied chest pain, nausea, vomiting. Reported chronic diarrhea. Denies urinary symptoms.  ED Course: Found to have left lower lobe pneumonia treated with ceftriaxone and azithromycin. Cultures were sent. Dressing changed and lower extremities and admitted for further evaluation. Patient's wife at bedside in the ER.  Review of Systems: As per HPI otherwise 10 point review of systems negative.    Past Medical History:  Diagnosis Date  . Arteriosclerotic cardiovascular disease (ASCVD)    MI-2000s; stent to the proximal LAD and diagonal in 2001; stress nuclear in 2008-impaired exercise capacity, left ventricular dilatation, moderately to severely depressed EF, apical, inferior and anteroseptal scar  . Atrial flutter ([place])   . 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 Cellulitis of leg    both legs  . Chronic diarrhea   . Chronic kidney disease, stage 3, mod decreased GFR    Creatinine of 1.84 in 06/2011 and 1.5 in 07/2011  .  Diabetes mellitus    Insulin  . GERD (gastroesophageal reflux disease)   . Gout   . Hyperlipidemia   . Hypertension   . Injection site reaction   . Multiple myeloma 07/01/2011  . Myocardial infarction 2000  . Obesity   . Pedal edema    Venous insufficiency  . Sleep apnea    uses cpap  . Ulcer [facility])     Past Surgical History:  Procedure Laterality Date  . ABSCESS DRAINAGE     Scrotal  . BIOPSY  01/02/2012   Procedure: BIOPSY;  Surgeon: Rogene Houston, MD;  Location: [facility];  Service: Endoscopy;  Laterality: N/A;  . BONE MARROW BIOPSY  05/13/11  . CARDIAC CATHETERIZATION     cardiac stent  . CARDIOVERSION N/A 10/13/2012   Procedure: CARDIOVERSION;  Surgeon: Yehuda Savannah, MD;  Location: [facility] ORS;  Service: Cardiovascular;  Laterality: N/A;  . CATARACT EXTRACTION W/PHACO Left 02/13/2014   Procedure: CATARACT EXTRACTION PHACO AND INTRAOCULAR LENS PLACEMENT ([place]);  Surgeon: Tonny Branch, MD;  Location: [facility] ORS;  Service: Ophthalmology;  Laterality: Left;  CDE:  7.67  . CATARACT EXTRACTION W/PHACO Right 03/02/2014   Procedure: CATARACT EXTRACTION PHACO AND INTRAOCULAR LENS PLACEMENT RIGHT EYE CDE=16.81;  Surgeon: Tonny Branch, MD;  Location: [facility] ORS;  Service: Ophthalmology;  Laterality: Right;  . COLONOSCOPY  11/28/2011   Procedure: COLONOSCOPY;  Surgeon: Rogene Houston, MD;  Location: [facility] END[facility];  Service: Endoscopy;  Laterality: N/A;  930  . ESOPHAGOGASTRODUODENOSCOPY  01/02/2012   Procedure: ESOPHAGOGASTRODUODENOSCOPY (EGD);  Surgeon: Rogene Houston, MD;  Location: [facility];  Service: Endoscopy;  Laterality: N/A;  100  . ESOPHAGOGASTRODUODENOSCOPY N/A 09/20/2012   Procedure:  ESOPHAGOGASTRODUODENOSCOPY (EGD);  Surgeon: Rogene Houston, MD;  Location: [facility];  Service: Endoscopy;  Laterality: N/A;  . EUS N/A 10/07/2012   Procedure: UPPER ENDOSCOPIC ULTRASOUND (EUS) LINEAR;  Surgeon: Milus Banister, MD;  Location: [facility] ENDOSCOPY;  Service: Endoscopy;  Laterality: N/A;  .  INCISION AND DRAINAGE ABSCESS ANAL    . LAPAROSCOPIC GASTRIC BANDING  2006   has been removed  . PORT-A-CATH REMOVAL Left 12/07/2012   Procedure: REMOVAL PORT-A-CATH;  Surgeon: Scherry Ran, MD;  Location: [facility] ORS;  Service: General;  Laterality: Left;  . PORTACATH PLACEMENT  07/07/2011   Procedure: INSERTION PORT-A-CATH;  Surgeon: Scherry Ran, MD;  Location: [facility] ORS;  Service: General;  Laterality: N/A;  . PORTACATH PLACEMENT N/A 12/07/2012   Procedure: INSERTION PORT-A-CATH;  Surgeon: Scherry Ran, MD;  Location: [facility] ORS;  Service: General;  Laterality: N/A;  Attempted portacath placement on left and right side  . WRIST SURGERY     Left; removal of bone fragment    Social history: reports that he quit smoking about 15 years ago. His smoking use included Cigarettes and Cigars. He has a 0.25 pack-year smoking history. He has never used smokeless tobacco. He reports that he does not drink alcohol or use drugs.  No Known Allergies allergies reviewed in [place] no known drug allergy.  Family History  Problem Relation Age of Onset  . Heart disease Mother   . Cancer Mother   . Diabetes Father   . Arthritis    . Anesthesia problems Neg Hx   . Hypotension Neg Hx   . Malignant hyperthermia Neg Hx   . Pseudochol deficiency Neg Hx      Prior to Admission medications   Medication Sig Start Date End Date Taking? Authorizing Provider  acyclovir (ZOVIRAX) 400 MG tablet TAKE ONE TABLET BY MOUTH EVERY MORNING. 03/02/16  Yes Manon Hilding Kefalas, PA-C  allopurinol (ZYLOPRIM) 300 MG tablet TAKE ONE TABLET BY MOUTH ONCE DAILY. 05/27/16  Yes Manon Hilding Kefalas, PA-C  atorvastatin (LIPITOR) 80 MG tablet TAKE ONE TABLET BY MOUTH AT BEDTIME. 04/14/16  Yes Herminio Commons, MD  calcitRIOL (ROCALTROL) 0.25 MCG capsule Take 0.25 mcg by mouth 3 (three) times a week. Monday, Wednesday, Friday. 12/14/13  Yes Historical Provider, MD  calcium carbonate (OS-CAL) 600 MG TABS tablet Take 1,800 mg by mouth 2  (two) times daily.    Yes Historical Provider, MD  cholestyramine (QUESTRAN) 4 g packet Take 1 packet (4 g total) by mouth 2 (two) times daily. 02/26/16  Yes Rogene Houston, MD  CREON 36000 units CPEP capsule TAKE 5 CAPSULES BY MOUTH DAILY WITH MEALS AND 2 CAPSULES WITH SNACKS. 05/27/16  Yes Rogene Houston, MD  dexamethasone (DECADRON) 4 MG tablet TAKE 5 TABLETS BY MOUTH 2 TIMES A DAY EVERY FRIDAY. 02/05/16  Yes Baird Cancer, PA-C  dexlansoprazole (DEXILANT) 60 MG capsule Take 1 capsule (60 mg total) by mouth daily. 02/26/16  Yes Rogene Houston, MD  dicyclomine (BENTYL) 20 MG tablet Take 1 tablet (20 mg total) by mouth 3 (three) times daily before meals. 02/26/16  Yes Rogene Houston, MD  diphenoxylate-atropine (LOMOTIL) 2.5-0.025 MG tablet TAKE (2) TABLETS BY MOUTH TWICE DAILY. 11/14/15  Yes Rogene Houston, MD  HYDROcodone-acetaminophen (NORCO/VICODIN) 5-325 MG per tablet Take 1-2 tablets by mouth every 4 (four) hours as needed for moderate pain or severe pain. 12/01/14  Yes Manon Hilding Kefalas, PA-C  insulin detemir (LEVEMIR) 100 UNIT/ML injection Inject 40  Units into the skin every morning.  09/08/12  Yes Kathie Dike, MD  insulin lispro (HUMALOG) 100 UNIT/ML injection Inject 5-30 Units into the skin 2 (two) times daily with a meal. Sliding Scale per patient   Yes Historical Provider, MD  lenalidomide ([name]) 5 MG capsule TAKE 1 CAPSULE BY MOUTH DAILY FOR 7 DAYS ON FOLLOWED BY 7 DAYS OFF. 06/09/16  Yes Manon Hilding Kefalas, PA-C  lisinopril (PRINIVIL,ZESTRIL) 5 MG tablet Take 5 mg by mouth every other day. 12/14/13  Yes Historical Provider, MD  loratadine (CLARITIN) 10 MG tablet Take 10 mg by mouth every morning.    Yes Historical Provider, MD  metoprolol succinate (TOPROL-XL) 25 MG 24 hr tablet TAKE ONE TABLET BY MOUTH ONCE DAILY. 03/31/16  Yes Herminio Commons, MD  Multiple Vitamins-Minerals (MULTIVITAMINS THER. W/MINERALS) TABS Take 1 tablet by mouth daily.    Yes Historical Provider, MD    nitroGLYCERIN (NITROSTAT) 0.4 MG SL tablet Place 1 tablet (0.4 mg total) under the tongue every 5 (five) minutes as needed for chest pain. 03/16/15  Yes Patrici Ranks, MD  potassium chloride SA (K-DUR,KLOR-CON) 20 MEQ tablet TAKE 2 TABLETS BY MOUTH TWICE DAILY. 04/14/16  Yes Manon Hilding Kefalas, PA-C  sodium bicarbonate 650 MG tablet Take 1,300 mg by mouth 3 (three) times daily.   Yes Historical Provider, MD  sulfamethoxazole-trimethoprim (BACTRIM DS,SEPTRA DS) 800-160 MG tablet TAKE ONE TABLET BY MOUTH EVERY MONDAY, WEDNESDAY, AND FRIDAY. 02/05/16  Yes Baird Cancer, PA-C  torsemide (DEMADEX) 20 MG tablet Take 40 mg by mouth daily. 09/25/11 04/22/17 Yes Delfina Redwood, MD  torsemide (DEMADEX) 20 MG tablet TAKE 2 TABLETS BY MOUTH EACH MORNING AND 1 TABLET EACH EVENING. 04/22/16  Yes Butch Penny, NP  Vitamin D, Ergocalciferol, (DRISDOL) 50000 units CAPS capsule TAKE 1 CAPSULE BY MOUTH EVERY THIRTY DAYS. 03/31/16  Yes [name] S Kefalas, PA-C  XARELTO 20 MG TABS tablet TAKE 1 TABLET BY MOUTH DAILY. 05/27/16  Yes Herminio Commons, MD    Physical Exam: Vitals:   06/10/16 0953 06/10/16 1030 06/10/16 1142 06/10/16 1232  BP: 134/57 (!) 96/52  114/69  Pulse: 66 79  64  Resp: _0 Temp: 103 F (39.4 C)   101 F (38.3 C)  TempSrc: Oral   Oral  SpO2: 99% 100% 97% 98%  Weight: (!) 167.8 kg (370 lb)     Height: _1  (1.905 m)         Constitutional: Morbidly obese male lying on bed comfortable. Vitals:   06/10/16 0953 06/10/16 1030 06/10/16 1142 06/10/16 1232  BP: 134/57 (!) 96/52  114/69  Pulse: 66 79  64  Resp: _2 Temp: 103 F (39.4 C)   101 F (38.3 C)  TempSrc: Oral   Oral  SpO2: 99% 100% 97% 98%  Weight: (!) 167.8 kg (370 lb)     Height: _3  (1.905 m)      Eyes: PERRL ENMT: Mucous membranes are moist.  Neck: normal Respiratory: Bibasal crackles, no wheezing, and decreased breath sound. Cardiovascular: Regular rate and rhythm, no murmurs / rubs /  gallops.  Abdomen: Soft, distended due to obesity, nontender. Bowel sound positive.  Musculoskeletal: Bilateral lower extremity has dressing applied. Skin: Lower extremities has open his skin and weeping, currently has dressing. Neurologic: Alert, awake and following commands. No focal neurological deficit.  Psychiatric: Normal judgment and insight. Alert and oriented x 3. Normal mood.    Labs on Admission:  I have personally reviewed following labs and imaging studies  CBC:  Recent Labs Lab 06/10/16 1035  WBC 8.6  NEUTROABS 6.9  HGB 11.4*  HCT 34.6*  MCV 96.9  PLT 867*   Basic Metabolic Panel:  Recent Labs Lab 06/10/16 1035  NA 135  K 3.9  CL 102  CO2 24  GLUCOSE 132*  BUN 48*  CREATININE 1.96*  CALCIUM 8.5*   GFR: Estimated Creatinine Clearance: 69.3 mL/min (by C-G formula based on SCr of 1.96 mg/dL (H)). Liver Function Tests:  Recent Labs Lab 06/10/16 1035  AST 33  ALT 41  ALKPHOS 89  BILITOT 0.8  PROT 5.9*  ALBUMIN 3.4*   No results for input(s): LIPASE, AMYLASE in the last 168 hours. No results for input(s): AMMONIA in the last 168 hours. Coagulation Profile: No results for input(s): INR, PROTIME in the last 168 hours. Cardiac Enzymes: No results for input(s): CKTOTAL, CKMB, CKMBINDEX, TROPONINI in the last 168 hours. BNP (last 3 results) No results for input(s): PROBNP in the last 8760 hours. HbA1C: No results for input(s): HGBA1C in the last 72 hours. CBG: No results for input(s): GLUCAP in the last 168 hours. Lipid Profile: No results for input(s): CHOL, HDL, LDLCALC, TRIG, CHOLHDL, LDLDIRECT in the last 72 hours. Thyroid Function Tests: No results for input(s): TSH, T4TOTAL, FREET4, T3FREE, THYROIDAB in the last 72 hours. Anemia Panel: No results for input(s): VITAMINB12, FOLATE, FERRITIN, TIBC, IRON, RETICCTPCT in the last 72 hours. Urine analysis:    Component Value Date/Time   COLORURINE YELLOW 06/10/2016 1032   APPEARANCEUR HAZY (A)  06/10/2016 1032   LABSPEC 1.015 06/10/2016 1032   PHURINE 5.5 06/10/2016 1032   GLUCOSEU NEGATIVE 06/10/2016 1032   HGBUR MODERATE (A) 06/10/2016 1032   BILIRUBINUR NEGATIVE 06/10/2016 1032   KETONESUR NEGATIVE 06/10/2016 1032   PROTEINUR 100 (A) 06/10/2016 1032   UROBILINOGEN 0.2 10/03/2012 1809   NITRITE NEGATIVE 06/10/2016 1032   LEUKOCYTESUR TRACE (A) 06/10/2016 1032   Sepsis Labs: !!!!!!!!!!!!!!!!!!!!!!!!!!!!!!!!!!!!!!!!!!!! _0 (procalcitonin:4,lacticidven:4) ) Recent Results (from the past 240 hour(s))  Blood culture (routine x 2)     Status: None (Preliminary result)   Collection Time: 06/10/16 10:36 AM  Result Value Ref Range Status   Specimen Description BLOOD BLOOD RIGHT FOREARM  Final   Special Requests BOTTLES DRAWN AEROBIC AND ANAEROBIC 10CC EACH  Final   Culture PENDING  Incomplete   Report Status PENDING  Incomplete  Blood culture (routine x 2)     Status: None (Preliminary result)   Collection Time: 06/10/16 10:43 AM  Result Value Ref Range Status   Specimen Description BLOOD LEFT ANTECUBITAL  Final   Special Requests BOTTLES DRAWN AEROBIC AND ANAEROBIC 10CC EACH  Final   Culture PENDING  Incomplete   Report Status PENDING  Incomplete     Radiological Exams on Admission: Dg Chest 2 View  Result Date: 06/10/2016 CLINICAL DATA:  Cough and congestion for 2 days with fevers EXAM: CHEST  2 VIEW COMPARISON:  06/25/2015 FINDINGS: Cardiac shadow is within normal limits. Significant left basilar infiltrate is noted projecting in the left lower lobe anteriorly. No sizable effusion is noted. No bony abnormality is seen. Right chest port is again noted. IMPRESSION: Left basilar pneumonia. Electronically Signed   By: Inez Catalina M.D.   On: 06/10/2016 11:09    EKG: Independently reviewed. Sinus rhythm and multiple PVCs. Assessment/Plan Active Problems:   Pneumonia  #Community-acquired pneumonia: Chest x-ray consistent with left lower lobe pneumonia. Patient with  fever, cough and  shortness of breath. -Follow up culture results, check sputum culture -Check respiratory viral studies, droplet precaution -Continue ceftriaxone and azithromycin -Patient is not hypoxic.  #Bilateral lower extremities cellulitis, worse on the left side: Dressing change in the ER. Reportedly patient has lymphedema of lower extremities and follows up with his physician outpatient. -On ceftriaxone already. Patient takes chronic Bactrim, 3 times a week. -Wound care team consulted. -Elevated leg while on bed.  #History of atrial flutter: Patient is currently on sinus rhythm. Continue metoprolol and xarelto.  #Insulin-dependent diabetes: Resume home dose of insulin. Monitor blood sugar level.  #Hypertension: Patient had hypotension in the ER. Receiving metoprolol from tomorrow depending on blood pressure.  #Chronic diarrhea: Continue Bentyl and Lomotil as needed.  #GERD: Continue Protonix.  PT, OT evaluation. Needs wound care evaluation and dressing changes in the hospital. He will benefit from outpatient referral to wound care clinic on discharge.  DVT prophylaxis:  systemic anticoagulation  Code Status:  full code  Family Communication: patient's wife at bedside  Disposition Plan: admitted in inpatient  Consults called: none  Admission status:Inpatient.    Caelan Branden Tanna Furry MD [facility] Pager [phone]  If 7PM-7AM, please contact night-coverage [email or web address] Password [email or web address]  06/10/2016, 1:09 PM

## 2016-06-10 NOTE — Consult Note (Addendum)
[place] Nurse wound consult note Reason for Consult: Consult requested for left calf wound.  Performed via phone call with assistance from staff nurse in the ER for assessment and measurements. Wound type: Left calf with generalized cellulitis; edema and erythremia,  patchy areas of partial thickness skin loss  Measurement: Affected area is approx 6X6cm Wound bed: red moist wound bed Drainage (amount, consistency, odor) mod amt yellow drainage, no odor Dressing procedure/placement/frequency: Xeroform gauze to protect and promote healing, ABD pads and kerlex to absorb drainage. Discussed plan of care with ER nurse via phone. Please re-consult if further assistance is needed.  Thank-you,  Julien Girt MSN, [place], [place], [place], [place]

## 2016-06-10 NOTE — ED Notes (Signed)
Wife adminster pts creon dose

## 2016-06-10 NOTE — ED Notes (Signed)
Dr Wilson Singer notified of BP readings. Gave order for NS 144ml/ hr

## 2016-06-10 NOTE — ED Notes (Signed)
Spoke with dr Sharyn Creamer regarding creon and low bp. May have 5 creon 36,000 with meals and give ns bolus 500 ml. D/C ns 125 ml/hr

## 2016-06-10 NOTE — ED Triage Notes (Signed)
Patient complaining of wound to lower left leg. States "it's been swollen and puffy for a week and broke open yesterday." Also complaining of cough x 2 days. Patient has fever of 103.0 in triage.

## 2016-06-10 NOTE — ED Provider Notes (Signed)
[facility] DEPT Provider Note   CSN: [id number] Arrival date & time: 06/10/16  0941   By signing my name below, I, Collene Leyden, attest that this documentation has been prepared under the direction and in the presence of Virgel Manifold, MD. Electronically Signed: Collene Leyden, Scribe. 06/10/16. 11:35 AM.  History   Chief Complaint Chief Complaint  Patient presents with  . Wound Infection  . Cough   HPI Comments: [name] is a 56 y.o. male with a hx of ASCVD, DM,  HTN, and MI, who presents to the Emergency Department complaining of a wound to the left lower leg that appeared one week ago. Patient states the wound swelled up and burst yesterday. He has associated cough w/ phlegm, congestion, swelling, and puffiness. Patient reports taking tylenol and hydrocodone 2 days ago with no improvement. Last chemotherapy session was a while ago, in remission. He denies any vomiting, diarrhea, and urinary symptoms.   The history is provided by the patient. No language interpreter was used.    Past Medical History:  Diagnosis Date  . Arteriosclerotic cardiovascular disease (ASCVD)    MI-2000s; stent to the proximal LAD and diagonal in 2001; stress nuclear in 2008-impaired exercise capacity, left ventricular dilatation, moderately to severely depressed EF, apical, inferior and anteroseptal scar  . Atrial flutter ([place])   . 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 Cellulitis of leg    both legs  . Chronic diarrhea   . Chronic kidney disease, stage 3, mod decreased GFR    Creatinine of 1.84 in 06/2011 and 1.5 in 07/2011  . Diabetes mellitus    Insulin  . GERD (gastroesophageal reflux disease)   . Gout   . Hyperlipidemia   . Hypertension   . Injection site reaction   . Multiple myeloma 07/01/2011  . Myocardial infarction 2000  . Obesity   . Pedal edema    Venous insufficiency  . Sleep apnea    uses cpap  . Ulcer [facility])     Patient Active Problem List   Diagnosis Date Noted  .  Hypogammaglobulinemia ([place]) 03/09/2016  . Hypokalemia 03/09/2016  . Diaphragmatic hernia without obstruction 02/26/2016  . Chronic systolic heart failure ([place]) 12/19/2013  . Anemia, normocytic normochromic 10/09/2012  . Chronic pancreatitis ([place]) 10/07/2012  . Atrial flutter ([place]) 09/17/2012  . DDD (degenerative disc disease), cervical 03/18/2012  . Arteriosclerotic cardiovascular disease (ASCVD)   . Hypertension   . Hyperlipidemia   . Multiple myeloma ([place]) 07/01/2011  . Morbid obesity ([place]) 04/29/2010  . DIABETES MELLITUS, TYPE II, ON INSULIN 11/15/2008  . SLEEP APNEA 11/15/2008    Past Surgical History:  Procedure Laterality Date  . ABSCESS DRAINAGE     Scrotal  . BIOPSY  01/02/2012   Procedure: BIOPSY;  Surgeon: Rogene Houston, MD;  Location: [facility] END[facility];  Service: Endoscopy;  Laterality: N/A;  . BONE MARROW BIOPSY  05/13/11  . CARDIAC CATHETERIZATION     cardiac stent  . CARDIOVERSION N/A 10/13/2012   Procedure: CARDIOVERSION;  Surgeon: Yehuda Savannah, MD;  Location: [facility] ORS;  Service: Cardiovascular;  Laterality: N/A;  . CATARACT EXTRACTION W/PHACO Left 02/13/2014   Procedure: CATARACT EXTRACTION PHACO AND INTRAOCULAR LENS PLACEMENT ([place]);  Surgeon: Tonny Branch, MD;  Location: [facility] ORS;  Service: Ophthalmology;  Laterality: Left;  CDE:  7.67  . CATARACT EXTRACTION W/PHACO Right 03/02/2014   Procedure: CATARACT EXTRACTION PHACO AND INTRAOCULAR LENS PLACEMENT RIGHT EYE CDE=16.81;  Surgeon: Tonny Branch, MD;  Location: [facility] ORS;  Service: Ophthalmology;  Laterality: Right;  . COLONOSCOPY  11/28/2011   Procedure: COLONOSCOPY;  Surgeon: Rogene Houston, MD;  Location: [facility] END[facility];  Service: Endoscopy;  Laterality: N/A;  930  . ESOPHAGOGASTRODUODENOSCOPY  01/02/2012   Procedure: ESOPHAGOGASTRODUODENOSCOPY (EGD);  Surgeon: Rogene Houston, MD;  Location: [facility];  Service: Endoscopy;  Laterality: N/A;  100  . ESOPHAGOGASTRODUODENOSCOPY N/A 09/20/2012   Procedure:  ESOPHAGOGASTRODUODENOSCOPY (EGD);  Surgeon: Rogene Houston, MD;  Location: [facility];  Service: Endoscopy;  Laterality: N/A;  . EUS N/A 10/07/2012   Procedure: UPPER ENDOSCOPIC ULTRASOUND (EUS) LINEAR;  Surgeon: Milus Banister, MD;  Location: [facility] ENDOS[facility];  Service: Endoscopy;  Laterality: N/A;  . INCISION AND DRAINAGE ABSCESS ANAL    . LAPAROSCOPIC GASTRIC BANDING  2006   has been removed  . PORT-A-CATH REMOVAL Left 12/07/2012   Procedure: REMOVAL PORT-A-CATH;  Surgeon: Scherry Ran, MD;  Location: [facility] ORS;  Service: General;  Laterality: Left;  . PORTACATH PLACEMENT  07/07/2011   Procedure: INSERTION PORT-A-CATH;  Surgeon: Scherry Ran, MD;  Location: [facility] ORS;  Service: General;  Laterality: N/A;  . PORTACATH PLACEMENT N/A 12/07/2012   Procedure: INSERTION PORT-A-CATH;  Surgeon: Scherry Ran, MD;  Location: [facility] ORS;  Service: General;  Laterality: N/A;  Attempted portacath placement on left and right side  . WRIST SURGERY     Left; removal of bone fragment       Home Medications    Prior to Admission medications   Medication Sig Start Date End Date Taking? Authorizing Provider  acyclovir (ZOVIRAX) 400 MG tablet TAKE ONE TABLET BY MOUTH EVERY MORNING. 03/02/16   Baird Cancer, PA-C  allopurinol (ZYLOPRIM) 300 MG tablet TAKE ONE TABLET BY MOUTH ONCE DAILY. 05/27/16   Baird Cancer, PA-C  atorvastatin (LIPITOR) 80 MG tablet TAKE ONE TABLET BY MOUTH AT BEDTIME. 04/14/16   Herminio Commons, MD  calcitRIOL (ROCALTROL) 0.25 MCG capsule Take 0.25 mcg by mouth 3 (three) times a week. Monday, Wednesday, Friday. 12/14/13   Historical Provider, MD  calcium carbonate (OS-CAL) 600 MG TABS tablet Take 1,800 mg by mouth 2 (two) times daily.     Historical Provider, MD  cholestyramine (QUESTRAN) 4 g packet Take 1 packet (4 g total) by mouth 2 (two) times daily. 02/26/16   Rogene Houston, MD  CREON 36000 units CPEP capsule TAKE 5 CAPSULES BY MOUTH DAILY WITH MEALS AND 2 CAPSULES WITH  SNACKS. 05/27/16   Rogene Houston, MD  dexamethasone (DECADRON) 4 MG tablet TAKE 5 TABLETS BY MOUTH 2 TIMES A DAY EVERY FRIDAY. 02/05/16   Baird Cancer, PA-C  dexlansoprazole (DEXILANT) 60 MG capsule Take 1 capsule (60 mg total) by mouth daily. 02/26/16   Rogene Houston, MD  dicyclomine (BENTYL) 20 MG tablet Take 1 tablet (20 mg total) by mouth 3 (three) times daily before meals. 02/26/16   Rogene Houston, MD  diphenoxylate-atropine (LOMOTIL) 2.5-0.025 MG tablet TAKE (2) TABLETS BY MOUTH TWICE DAILY. 11/14/15   Rogene Houston, MD  HYDROcodone-acetaminophen (NORCO/VICODIN) 5-325 MG per tablet Take 1-2 tablets by mouth every 4 (four) hours as needed for moderate pain or severe pain. 12/01/14   Baird Cancer, PA-C  insulin detemir (LEVEMIR) 100 UNIT/ML injection Inject 40 Units into the skin every morning.  09/08/12   Kathie Dike, MD  insulin lispro (HUMALOG) 100 UNIT/ML injection Inject 5-30 Units into the skin 2 (two) times daily with a meal. Sliding Scale per patient  Historical Provider, MD  lenalidomide ([name]) 5 MG capsule TAKE 1 CAPSULE BY MOUTH DAILY FOR 7 DAYS ON FOLLOWED BY 7 DAYS OFF. 06/09/16   Baird Cancer, PA-C  lisinopril (PRINIVIL,ZESTRIL) 5 MG tablet Take 5 mg by mouth every other day. 12/14/13   Historical Provider, MD  loratadine (CLARITIN) 10 MG tablet Take 10 mg by mouth every morning.     Historical Provider, MD  metoprolol succinate (TOPROL-XL) 25 MG 24 hr tablet TAKE ONE TABLET BY MOUTH ONCE DAILY. 03/31/16   Herminio Commons, MD  Multiple Vitamins-Minerals (MULTIVITAMINS THER. W/MINERALS) TABS Take 1 tablet by mouth daily.     Historical Provider, MD  nitroGLYCERIN (NITROSTAT) 0.4 MG SL tablet Place 1 tablet (0.4 mg total) under the tongue every 5 (five) minutes as needed for chest pain. 03/16/15   Patrici Ranks, MD  potassium chloride SA (K-DUR,KLOR-CON) 20 MEQ tablet TAKE 2 TABLETS BY MOUTH TWICE DAILY. 04/14/16   Baird Cancer, PA-C  sodium bicarbonate 650  MG tablet Take 1,300 mg by mouth 3 (three) times daily.    Historical Provider, MD  sulfamethoxazole-trimethoprim (BACTRIM DS,SEPTRA DS) 800-160 MG tablet TAKE ONE TABLET BY MOUTH EVERY MONDAY, WEDNESDAY, AND FRIDAY. 02/05/16   Baird Cancer, PA-C  torsemide (DEMADEX) 20 MG tablet Take 40 mg by mouth daily. 09/25/11 04/22/17  Delfina Redwood, MD  torsemide (DEMADEX) 20 MG tablet TAKE 2 TABLETS BY MOUTH EACH MORNING AND 1 TABLET EACH EVENING. 04/22/16   Butch Penny, NP  Vitamin D, Ergocalciferol, (DRISDOL) 50000 units CAPS capsule TAKE 1 CAPSULE BY MOUTH EVERY THIRTY DAYS. 03/31/16   [name] S Kefalas, PA-C  XARELTO 20 MG TABS tablet TAKE 1 TABLET BY MOUTH DAILY. 05/27/16   Herminio Commons, MD    Family History Family History  Problem Relation Age of Onset  . Heart disease Mother   . Cancer Mother   . Diabetes Father   . Arthritis    . Anesthesia problems Neg Hx   . Hypotension Neg Hx   . Malignant hyperthermia Neg Hx   . Pseudochol deficiency Neg Hx     Social History Social History  Substance Use Topics  . Smoking status: Former Smoker    Packs/day: 0.25    Years: 1.00    Types: Cigarettes, Cigars    Quit date: 05/17/2001  . Smokeless tobacco: Never Used  . Alcohol use No     Allergies   Patient has no known allergies.   Review of Systems Review of Systems  Constitutional: Positive for fever.  Genitourinary: Negative for difficulty urinating and dysuria.  Skin: Positive for wound.  All other systems reviewed and are negative.    Physical Exam Updated Vital Signs BP 134/57 (BP Location: Right Arm)   Pulse 66   Temp 103 F (39.4 C) (Oral)   Resp 22   Ht '6\' 3"'  (1.905 m)   Wt (!) 370 lb (167.8 kg)   SpO2 99%   BMI 46.25 kg/m   Physical Exam  Constitutional: He is oriented to person, place, and time. He appears well-developed and well-nourished.  HENT:  Head: Normocephalic and atraumatic.  Eyes: EOM are normal.  Neck: Normal range of motion.    Cardiovascular: Normal rate, regular rhythm, normal heart sounds and intact distal pulses.   Pulmonary/Chest: Effort normal and breath sounds normal. No respiratory distress.  Abdominal: Soft. He exhibits no distension. There is no tenderness.  Musculoskeletal: Normal range of motion.  Superficial wound to the left  shin. Its weeping serous fluid. It has swelling increased warmth and tenderness. Consistent with cellulitis.   Neurological: He is alert and oriented to person, place, and time.  Skin: Skin is warm and dry.  Psychiatric: He has a normal mood and affect. Judgment normal.  Nursing note and vitals reviewed.    ED Treatments / Results  DIAGNOSTIC STUDIES: Oxygen Saturation is 99% on RA, normal by my interpretation.    COORDINATION OF CARE: 11:33 AM Discussed treatment plan with pt at bedside and pt agreed to plan.  Labs (all labs ordered are listed, but only abnormal results are displayed) Labs Reviewed  URINE CULTURE - Abnormal; Notable for the following:       Result Value   Culture >=100,000 COLONIES/mL ESCHERICHIA COLI (*)    Organism ID, Bacteria ESCHERICHIA COLI (*)    All other components within normal limits  RESPIRATORY PANEL BY PCR - Abnormal; Notable for the following:    Influenza A H3 DETECTED (*)    All other components within normal limits  COMPREHENSIVE METABOLIC PANEL - Abnormal; Notable for the following:    Glucose, Bld 132 (*)    BUN 48 (*)    Creatinine, Ser 1.96 (*)    Calcium 8.5 (*)    Total Protein 5.9 (*)    Albumin 3.4 (*)    GFR calc non Af Amer 36 (*)    GFR calc Af Amer 42 (*)    All other components within normal limits  CBC WITH DIFFERENTIAL/PLATELET - Abnormal; Notable for the following:    RBC 3.57 (*)    Hemoglobin 11.4 (*)    HCT 34.6 (*)    RDW 15.6 (*)    Platelets 136 (*)    All other components within normal limits  URINALYSIS, ROUTINE W REFLEX MICROSCOPIC - Abnormal; Notable for the following:    APPearance HAZY (*)     Hgb urine dipstick MODERATE (*)    Protein, ur 100 (*)    Leukocytes, UA TRACE (*)    All other components within normal limits  URINALYSIS, MICROSCOPIC (REFLEX) - Abnormal; Notable for the following:    Bacteria, UA MANY (*)    All other components within normal limits  BASIC METABOLIC PANEL - Abnormal; Notable for the following:    Glucose, Bld 169 (*)    BUN 38 (*)    Creatinine, Ser 1.90 (*)    Calcium 7.5 (*)    GFR calc non Af Amer 38 (*)    GFR calc Af Amer 44 (*)    All other components within normal limits  CBC - Abnormal; Notable for the following:    RBC 3.05 (*)    Hemoglobin 9.7 (*)    HCT 29.8 (*)    RDW 15.7 (*)    Platelets 111 (*)    All other components within normal limits  GLUCOSE, CAPILLARY - Abnormal; Notable for the following:    Glucose-Capillary 234 (*)    All other components within normal limits  GLUCOSE, CAPILLARY - Abnormal; Notable for the following:    Glucose-Capillary 176 (*)    All other components within normal limits  INFLUENZA PANEL BY PCR (TYPE A & B, H1N1) - Abnormal; Notable for the following:    Influenza A By PCR POSITIVE (*)    All other components within normal limits  GLUCOSE, CAPILLARY - Abnormal; Notable for the following:    Glucose-Capillary 349 (*)    All other components within normal limits  GLUCOSE, CAPILLARY -  Abnormal; Notable for the following:    Glucose-Capillary 158 (*)    All other components within normal limits  CBC - Abnormal; Notable for the following:    RBC 3.07 (*)    Hemoglobin 9.6 (*)    HCT 30.2 (*)    Platelets 104 (*)    All other components within normal limits  BASIC METABOLIC PANEL - Abnormal; Notable for the following:    Chloride 114 (*)    BUN 29 (*)    Creatinine, Ser 1.74 (*)    Calcium 7.5 (*)    GFR calc non Af Amer 42 (*)    GFR calc Af Amer 48 (*)    All other components within normal limits  GLUCOSE, CAPILLARY - Abnormal; Notable for the following:    Glucose-Capillary 144 (*)     All other components within normal limits  GLUCOSE, CAPILLARY - Abnormal; Notable for the following:    Glucose-Capillary 108 (*)    All other components within normal limits  CBG MONITORING, ED - Abnormal; Notable for the following:    Glucose-Capillary 198 (*)    All other components within normal limits  CULTURE, BLOOD (ROUTINE X 2)  CULTURE, BLOOD (ROUTINE X 2)  CULTURE, EXPECTORATED SPUTUM-ASSESSMENT  CULTURE, RESPIRATORY (NON-EXPECTORATED)  LACTIC ACID, PLASMA  LACTIC ACID, PLASMA  GLUCOSE, CAPILLARY    EKG  EKG Interpretation  Date/Time:  Tuesday June 10 2016 12:32:24 EST Ventricular Rate:  81 PR Interval:    QRS Duration: 93 QT Interval:  367 QTC Calculation: 376 R Axis:   -11 Text Interpretation:  Sinus rhythm , possibly junctional as P-waves are nearly indistinct Multiform ventricular premature complexes Baseline wander Low voltage QRS Abnormal ekg Confirmed by Carmin Muskrat  MD ([id number]) on 06/11/2016 11:20:11 AM       Radiology Dg Chest 2 View  Result Date: 06/11/2016 CLINICAL DATA:  Shortness of breath and cough for several days. Left lower lobe pneumonia. Multiple myeloma. EXAM: CHEST  2 VIEW COMPARISON:  06/10/2016 and 06/25/2015 FINDINGS: Previously seen left lower lung airspace opacity is decreased since previous study. Left diaphragmatic hernia again seen containing the splenic flexure of colon. Right lung is clear. No evidence of pneumothorax or pleural effusion. Heart size is stable. Right-sided power port remains in appropriate position. IMPRESSION: Decreased left lower lung airspace opacity since previous study. Stable left diaphragmatic hernia containing splenic flexure of colon. Electronically Signed   By: Earle Gell M.D.   On: 06/11/2016 16:27    Procedures Procedures (including critical care time)  Medications Ordered in ED Medications  0.9 %  sodium chloride infusion ( Intravenous New Bag/Given 06/12/16 0853)  acetaminophen (TYLENOL) tablet  1,000 mg (1,000 mg Oral Given 06/10/16 1013)  cefTRIAXone (ROCEPHIN) 1 g in dextrose 5 % 50 mL IVPB (0 g Intravenous Stopped 06/10/16 1230)  sodium chloride 0.9 % bolus 1,000 mL (0 mLs Intravenous Stopped 06/10/16 1504)  guaiFENesin-codeine 100-10 MG/5ML solution 5 mL (5 mLs Oral Given 06/10/16 1331)  cefTRIAXone (ROCEPHIN) 1 g in dextrose 5 % 50 mL IVPB (0 g Intravenous Stopped 06/10/16 1711)  0.9 %  sodium chloride infusion ( Intravenous Stopped 06/10/16 1755)  sodium chloride 0.9 % bolus 500 mL (500 mLs Intravenous New Bag/Given 06/10/16 1802)  vancomycin (VANCOCIN) 2,000 mg in sodium chloride 0.9 % 500 mL IVPB (2,000 mg Intravenous Given 06/11/16 1549)  insulin detemir (LEVEMIR) injection 10 Units (10 Units Subcutaneous Given 06/11/16 1548)     Initial Impression / Assessment and Plan / ED  Course  I have reviewed the triage vital signs and the nursing notes.  Pertinent labs & imaging results that were available during my care of the patient were reviewed by me and considered in my medical decision making (see chart for details).  Clinical Course     55yM with fever, cough and generalized weakness. Has cellulitis, pneumonia and UTI. Abx. Admission.   Final Clinical Impressions(s) / ED Diagnoses   Final diagnoses:  SOB (shortness of breath)  Other abnormalities of gait and mobility  Cellulitis of left lower extremity  UTI Community acquired pneumonia  New Prescriptions New Prescriptions   No medications on file   I personally preformed the services scribed in my presence. The recorded information has been reviewed is accurate. Virgel Manifold, MD.     Virgel Manifold, MD 06/13/16 [phone]

## 2016-06-11 ENCOUNTER — Inpatient Hospital Stay (HOSPITAL_COMMUNITY): Payer: BLUE CROSS/BLUE SHIELD

## 2016-06-11 DIAGNOSIS — J181 Lobar pneumonia, unspecified organism: Secondary | ICD-10-CM

## 2016-06-11 LAB — RESPIRATORY PANEL BY PCR
Adenovirus: NOT DETECTED
Bordetella pertussis: NOT DETECTED
Chlamydophila pneumoniae: NOT DETECTED
Coronavirus 229E: NOT DETECTED
Coronavirus HKU1: NOT DETECTED
Coronavirus NL63: NOT DETECTED
Coronavirus OC43: NOT DETECTED
Influenza A H1 2009: NOT DETECTED
Influenza A H1: NOT DETECTED
Influenza A H3: DETECTED — AB
Influenza A: NOT DETECTED
Influenza B: NOT DETECTED
Metapneumovirus: NOT DETECTED
Mycoplasma pneumoniae: NOT DETECTED
Parainfluenza Virus 1: NOT DETECTED
Parainfluenza Virus 2: NOT DETECTED
Parainfluenza Virus 3: NOT DETECTED
Parainfluenza Virus 4: NOT DETECTED
Respiratory Syncytial Virus: NOT DETECTED
Rhinovirus / Enterovirus: NOT DETECTED

## 2016-06-11 LAB — EXPECTORATED SPUTUM ASSESSMENT W GRAM STAIN, RFLX TO RESP C

## 2016-06-11 LAB — BASIC METABOLIC PANEL
Anion gap: 7 (ref 5–15)
BUN: 38 mg/dL — ABNORMAL HIGH (ref 6–20)
CO2: 23 mmol/L (ref 22–32)
Calcium: 7.5 mg/dL — ABNORMAL LOW (ref 8.9–10.3)
Chloride: 108 mmol/L (ref 101–111)
Creatinine, Ser: 1.9 mg/dL — ABNORMAL HIGH (ref 0.61–1.24)
GFR calc Af Amer: 44 mL/min — ABNORMAL LOW (ref >60)
GFR calc non Af Amer: 38 mL/min — ABNORMAL LOW (ref >60)
Glucose, Bld: 169 mg/dL — ABNORMAL HIGH (ref 65–99)
Potassium: 4 mmol/L (ref 3.5–5.1)
Sodium: 138 mmol/L (ref 135–145)

## 2016-06-11 LAB — CBC
HCT: 29.8 % — ABNORMAL LOW (ref 39.0–52.0)
Hemoglobin: 9.7 g/dL — ABNORMAL LOW (ref 13.0–17.0)
MCH: 31.8 pg (ref 26.0–34.0)
MCHC: 32.6 g/dL (ref 30.0–36.0)
MCV: 97.7 fL (ref 78.0–100.0)
Platelets: 111 10*3/uL — ABNORMAL LOW (ref 150–400)
RBC: 3.05 MIL/uL — ABNORMAL LOW (ref 4.22–5.81)
RDW: 15.7 % — ABNORMAL HIGH (ref 11.5–15.5)
WBC: 5.6 10*3/uL (ref 4.0–10.5)

## 2016-06-11 LAB — GLUCOSE, CAPILLARY
Glucose-Capillary: 176 mg/dL — ABNORMAL HIGH (ref 65–99)
Glucose-Capillary: 349 mg/dL — ABNORMAL HIGH (ref 65–99)
Glucose-Capillary: 158 mg/dL — ABNORMAL HIGH (ref 65–99)
Glucose-Capillary: 144 mg/dL — ABNORMAL HIGH (ref 65–99)

## 2016-06-11 LAB — INFLUENZA PANEL BY PCR (TYPE A & B)
Influenza A By PCR: POSITIVE — AB
Influenza B By PCR: NEGATIVE

## 2016-06-11 MED ORDER — INSULIN ASPART 100 UNIT/ML ~~LOC~~ SOLN
5.0000 [IU] | Freq: Three times a day (TID) | SUBCUTANEOUS | Status: DC
  Administered 2016-06-11: 5 [IU] via SUBCUTANEOUS

## 2016-06-11 MED ORDER — GUAIFENESIN-DM 100-10 MG/5ML PO SYRP
5.0000 mL | ORAL_SOLUTION | ORAL | Status: DC | PRN
  Administered 2016-06-11 – 2016-06-12 (×4): 5 mL via ORAL
  Filled 2016-06-11 (×4): qty 5

## 2016-06-11 MED ORDER — VANCOMYCIN HCL 10 G IV SOLR
2000.0000 mg | Freq: Once | INTRAVENOUS | Status: AC
  Administered 2016-06-11: 2000 mg via INTRAVENOUS
  Filled 2016-06-11: qty 2000

## 2016-06-11 MED ORDER — OSELTAMIVIR PHOSPHATE 75 MG PO CAPS
75.0000 mg | ORAL_CAPSULE | Freq: Two times a day (BID) | ORAL | Status: DC
  Administered 2016-06-11 – 2016-06-12 (×3): 75 mg via ORAL
  Filled 2016-06-11 (×3): qty 1

## 2016-06-11 MED ORDER — ACYCLOVIR 200 MG PO CAPS
400.0000 mg | ORAL_CAPSULE | Freq: Every day | ORAL | Status: DC
  Administered 2016-06-11 – 2016-06-12 (×2): 400 mg via ORAL
  Filled 2016-06-11 (×2): qty 2

## 2016-06-11 MED ORDER — INSULIN DETEMIR 100 UNIT/ML ~~LOC~~ SOLN
10.0000 [IU] | Freq: Once | SUBCUTANEOUS | Status: AC
  Administered 2016-06-11: 10 [IU] via SUBCUTANEOUS
  Filled 2016-06-11: qty 0.1

## 2016-06-11 MED ORDER — CARVEDILOL 3.125 MG PO TABS
3.1250 mg | ORAL_TABLET | Freq: Two times a day (BID) | ORAL | Status: DC
  Administered 2016-06-11 – 2016-06-12 (×2): 3.125 mg via ORAL
  Filled 2016-06-11 (×2): qty 1

## 2016-06-11 MED ORDER — INSULIN DETEMIR 100 UNIT/ML ~~LOC~~ SOLN
50.0000 [IU] | Freq: Every morning | SUBCUTANEOUS | Status: DC
  Filled 2016-06-11 (×2): qty 0.5

## 2016-06-11 MED ORDER — DEXTROSE 5 % IV SOLN
500.0000 mg | INTRAVENOUS | Status: DC
  Administered 2016-06-12: 500 mg via INTRAVENOUS
  Filled 2016-06-11 (×2): qty 500

## 2016-06-11 MED ORDER — VANCOMYCIN HCL 10 G IV SOLR
1750.0000 mg | INTRAVENOUS | Status: DC
  Filled 2016-06-11: qty 1750

## 2016-06-11 MED ORDER — PIPERACILLIN-TAZOBACTAM 3.375 G IVPB
3.3750 g | Freq: Three times a day (TID) | INTRAVENOUS | Status: DC
  Administered 2016-06-11 – 2016-06-12 (×3): 3.375 g via INTRAVENOUS
  Filled 2016-06-11 (×3): qty 50

## 2016-06-11 MED ORDER — SODIUM CHLORIDE 0.9 % IV SOLN
INTRAVENOUS | Status: AC
  Administered 2016-06-11 – 2016-06-12 (×2): via INTRAVENOUS

## 2016-06-11 NOTE — Progress Notes (Addendum)
PROGRESS NOTE                                                                                                                                                                                                             Patient Demographics:    [name], is a 56 y.o. male, DOB - [birth year], RPR:[id number]  Admit date - 06/10/2016   Admitting Physician Dron Tanna Furry, MD  Outpatient Primary MD for the patient is [name], MD  LOS - 1  Chief Complaint  Patient presents with  . Wound Infection  . Cough       Brief Narrative     Subjective:    [name] today has, No headache, No chest pain, No abdominal pain - No Nausea, No new weakness tingling or numbness, +ve Cough and mild L leg pain.     Assessment  & Plan :     1. Sepsis due to combination of community-acquired pneumonia and left leg ulcer with cellulitis. Cultures obtained, sepsis physiology has resolved after IV fluids and antibiotics which will be continued, he still running fevers and I will escalate his antibiotics to vancomycin and Zosyn since he is a diabetic with a open ulcer, continue azithromycin for atypical coverage for pneumonia.    2. Flu positive. Added Tamiflu. Monitor repeat chest x-ray.  3. Dyslipidemia. I'll start and continue.  4. Gout. On allopurinol.  5. GERD. On PPI.  6. Chronic pancreatitis. Stable, symptom-free continue pancreatic enzymes with meals.  7. Chr. Systolic CHF EF 44% - compensated. Monitor. Will add low-dose Coreg if blood pressure can tolerate, avoiding ACE/ARB due to acute renal failure.  8. ARF on CKD 4  - due to sepsis , hydrate improving. Baseline creatinine around 1.6.  9. History of multiple myeloma. Stable follow with oncologist outpatient as before.  10. History of atrial flutter. Currently on xaralto continue.  11. Straight of CAD status post MI. Currently compensated. Continue in for  secondary prevention we'll add low-dose Coreg with caution and monitor, continue xaralto  12. DM type II. Sugars high will increase Levemir and add pre-meal NovoLog  CBG (last 3)   Recent Labs  06/10/16 2046 06/11/16 0729 06/11/16 1155  GLUCAP 234* 176* 349*    Lab Results  Component Value Date   HGBA1C 9.2 (H) 09/24/2011  Family Communication  : None  Code Status :  Full  Diet : Diet Heart Room service appropriate? Yes; Fluid consistency: Thin   Disposition Plan  :  Stay inpt  Consults  :  None  Procedures  :  None  DVT Prophylaxis  :  Xaralto  Lab Results  Component Value Date   PLT 111 (L) 06/11/2016    Inpatient Medications  Scheduled Meds: . acyclovir  400 mg Oral Daily  . allopurinol  300 mg Oral Daily  . atorvastatin  80 mg Oral QHS  . calcitRIOL  0.25 mcg Oral Once per day on Mon Wed Fri  . calcium carbonate  1,875 mg Oral BID WC  . cholestyramine  4 g Oral BID  . dicyclomine  20 mg Oral TID AC  . diphenoxylate-atropine  1 tablet Oral BID  . insulin aspart  0-15 Units Subcutaneous TID WC  . insulin aspart  0-5 Units Subcutaneous QHS  . insulin detemir  40 Units Subcutaneous q morning - 10a  . lipase/protease/amylase  180,000 Units Oral TID WC  . loratadine  10 mg Oral q morning - 10a  . multivitamin with minerals  1 tablet Oral Daily  . pantoprazole  40 mg Oral Daily  . potassium chloride SA  40 mEq Oral BID  . rivaroxaban  20 mg Oral Q supper  . sodium bicarbonate  1,300 mg Oral TID   Continuous Infusions: . sodium chloride     PRN Meds:.acetaminophen **OR** [DISCONTINUED] acetaminophen, guaiFENesin-dextromethorphan, HYDROcodone-acetaminophen, ipratropium-albuterol, nitroGLYCERIN, ondansetron **OR** ondansetron (ZOFRAN) IV  Antibiotics  :    Anti-infectives    Start     Dose/Rate Route Frequency Ordered Stop   06/11/16 1200  cefTRIAXone (ROCEPHIN) 1 g in dextrose 5 % 50 mL IVPB  Status:  Discontinued     1 g 100 mL/hr over 30  Minutes Intravenous Every 24 hours 06/10/16 1306 06/10/16 1459   06/11/16 1200  cefTRIAXone (ROCEPHIN) 2 g in dextrose 5 % 50 mL IVPB  Status:  Discontinued     2 g 100 mL/hr over 30 Minutes Intravenous Every 24 hours 06/10/16 1459 06/11/16 1340   06/11/16 1000  acyclovir (ZOVIRAX) tablet 400 mg  Status:  Discontinued     400 mg Oral  Every morning - 10a 06/10/16 1306 06/11/16 0756   06/11/16 1000  azithromycin (ZITHROMAX) tablet 500 mg  Status:  Discontinued     500 mg Oral Daily 06/10/16 1306 06/10/16 1929   06/11/16 1000  acyclovir (ZOVIRAX) 200 MG capsule 400 mg     400 mg Oral Daily 06/11/16 0756     06/11/16 0900  sulfamethoxazole-trimethoprim (BACTRIM DS,SEPTRA DS) 800-160 MG per tablet 1 tablet  Status:  Discontinued     1 tablet Oral Once per day on Mon Wed Fri 06/10/16 1306 06/11/16 1340   06/10/16 1500  cefTRIAXone (ROCEPHIN) 1 g in dextrose 5 % 50 mL IVPB     1 g 100 mL/hr over 30 Minutes Intravenous  Once 06/10/16 1457 06/10/16 1711   06/10/16 1145  cefTRIAXone (ROCEPHIN) 1 g in dextrose 5 % 50 mL IVPB     1 g 100 mL/hr over 30 Minutes Intravenous  Once 06/10/16 1143 06/10/16 1230   06/10/16 1145  azithromycin (ZITHROMAX) 500 mg in dextrose 5 % 250 mL IVPB  Status:  Discontinued     500 mg 250 mL/hr over 60 Minutes Intravenous Every 24 hours 06/10/16 1143 06/11/16 1340         Objective:  Vitals:   06/10/16 1832 06/10/16 1952 06/10/16 2130 06/11/16 0600  BP:  133/62  114/60  Pulse:  65 68 76  Resp:  (!) 28    Temp: 100.3 F (37.9 C) 99.7 F (37.6 C)  (!) 101.9 F (38.8 C)  TempSrc: Oral Oral  Oral  SpO2:  97% 93% 98%  Weight:  (!) 166.3 kg (366 lb 9.6 oz)    Height:  '6\' 3"'  (1.905 m)      Wt Readings from Last 3 Encounters:  06/10/16 (!) 166.3 kg (366 lb 9.6 oz)  03/11/16 (!) 165.6 kg (365 lb)  03/06/16 (!) 169 kg (372 lb 9.6 oz)     Intake/Output Summary (Last 24 hours) at 06/11/16 1345 Last data filed at 06/11/16 0300  Gross per 24 hour  Intake               250 ml  Output                0 ml  Net              250 ml     Physical Exam  Awake Alert, Oriented X 3, No new F.N deficits, Normal affect [place].AT,PERRAL Supple Neck,No JVD, No cervical lymphadenopathy appriciated.  Symmetrical Chest wall movement, Good air movement bilaterally, CTAB RRR,No Gallops,Rubs or new Murmurs, No Parasternal Heave +ve B.Sounds, Abd Soft, No tenderness, No organomegaly appriciated, No rebound - guarding or rigidity. No Cyanosis, Clubbing or edema, No new Rash or bruise , L calf under bandage, As have surrounding cellulitis mild-to-moderate.    Data Review:    CBC  Recent Labs Lab 06/10/16 1035 06/11/16 0504  WBC 8.6 5.6  HGB 11.4* 9.7*  HCT 34.6* 29.8*  PLT 136* 111*  MCV 96.9 97.7  MCH 31.9 31.8  MCHC 32.9 32.6  RDW 15.6* 15.7*  LYMPHSABS 0.8  --   MONOABS 0.6  --   EOSABS 0.2  --   BASOSABS 0.0  --     Chemistries   Recent Labs Lab 06/10/16 1035 06/11/16 0504  NA 135 138  K 3.9 4.0  CL 102 108  CO2 24 23  GLUCOSE 132* 169*  BUN 48* 38*  CREATININE 1.96* 1.90*  CALCIUM 8.5* 7.5*  AST 33  --   ALT 41  --   ALKPHOS 89  --   BILITOT 0.8  --    ------------------------------------------------------------------------------------------------------------------ No results for input(s): CHOL, HDL, LDLCALC, TRIG, CHOLHDL, LDLDIRECT in the last 72 hours.  Lab Results  Component Value Date   HGBA1C 9.2 (H) 09/24/2011   ------------------------------------------------------------------------------------------------------------------ No results for input(s): TSH, T4TOTAL, T3FREE, THYROIDAB in the last 72 hours.  Invalid input(s): FREET3 ------------------------------------------------------------------------------------------------------------------ No results for input(s): VITAMINB12, FOLATE, FERRITIN, TIBC, IRON, RETICCTPCT in the last 72 hours.  Coagulation profile No results for input(s): INR, PROTIME in the last 168  hours.  No results for input(s): DDIMER in the last 72 hours.  Cardiac Enzymes No results for input(s): CKMB, TROPONINI, MYOGLOBIN in the last 168 hours.  Invalid input(s): CK ------------------------------------------------------------------------------------------------------------------ No results found for: BNP  Micro Results Recent Results (from the past 240 hour(s))  Blood culture (routine x 2)     Status: None (Preliminary result)   Collection Time: 06/10/16 10:36 AM  Result Value Ref Range Status   Specimen Description BLOOD BLOOD RIGHT FOREARM  Final   Special Requests BOTTLES DRAWN AEROBIC AND ANAEROBIC 10CC EACH  Final   Culture NO GROWTH 1 DAY  Final  Report Status PENDING  Incomplete  Blood culture (routine x 2)     Status: None (Preliminary result)   Collection Time: 06/10/16 10:43 AM  Result Value Ref Range Status   Specimen Description BLOOD LEFT ANTECUBITAL  Final   Special Requests BOTTLES DRAWN AEROBIC AND ANAEROBIC 10CC EACH  Final   Culture NO GROWTH 1 DAY  Final   Report Status PENDING  Incomplete  Culture, sputum-assessment     Status: None   Collection Time: 06/10/16 11:29 PM  Result Value Ref Range Status   Specimen Description SPUTUM  Final   Special Requests NONE  Final   Sputum evaluation   Final    ABUNDANT WBC PRESENT, PREDOMINANTLY MONONUCLEAR CORRECTED ON 01/10 AT 1314: PREVIOUSLY REPORTED AS THIS SPECIMEN IS ACCEPTABLE FOR SPUTUM CULTURE MODERATE GRAM POSITIVE COCCI IN PAIRS RARE GRAM NEGATIVE RODS Performed at [facility]    Report Status 06/11/2016 FINAL  Final    Radiology Reports Dg Chest 2 View  Result Date: 06/10/2016 CLINICAL DATA:  Cough and congestion for 2 days with fevers EXAM: CHEST  2 VIEW COMPARISON:  06/25/2015 FINDINGS: Cardiac shadow is within normal limits. Significant left basilar infiltrate is noted projecting in the left lower lobe anteriorly. No sizable effusion is noted. No bony abnormality is seen. Right  chest port is again noted. IMPRESSION: Left basilar pneumonia. Electronically Signed   By: Inez Catalina M.D.   On: 06/10/2016 11:09    Time Spent in minutes  30   [name] K M.D on 06/11/2016 at 1:45 PM  Between 7am to 7pm - Pager - [phone]  After 7pm go to [email or web address] - password [facility]  [facility] -  Office  [phone]

## 2016-06-11 NOTE — Progress Notes (Signed)
Inpatient Diabetes Program Recommendations  AACE/ADA: New Consensus Statement on Inpatient Glycemic Control (2015)  Target Ranges:  Prepandial:   less than 140 mg/dL      Peak postprandial:   less than 180 mg/dL (1-2 hours)      Critically ill patients:  140 - 180 mg/dL   Lab Results  Component Value Date   GLUCAP 176 (H) 06/11/2016   HGBA1C 9.2 (H) 09/24/2011    Review of Glycemic Control  Inpatient Diabetes Program Recommendations:    Please consider A1c to determine prehospital glycemic control. Will follow.  Thank you, Nani Gasser. Meilech Virts, RN, MSN, CDE Inpatient Glycemic Control Team Team Pager [phone] (8am-5pm) 06/11/2016 9:30 AM

## 2016-06-11 NOTE — Care Management Note (Signed)
Case Management Note  Patient Details  Name: [name] MRN: [id number] Date of Birth: May 30, 1961  Subjective/Objective:    Patient adm from home with PNA. He walks with a cane. PT recommends OP PT for wound care.                 Action/Plan: CM will place electronic referral to [facility] per patient request. No other CM needs.  Expected Discharge Date:      06/12/2016            Expected Discharge Plan:  Home/Self Care  In-House Referral:  NA  Discharge planning Services  CM Consult  Post Acute Care Choice:  NA Choice offered to:  NA  DME Arranged:    DME Agency:     HH Arranged:    HH Agency:     Status of Service:  Completed, signed off  If discussed at [employer] of Stay Meetings, dates discussed:    Additional Comments:  Chayim Bialas, Chauncey Reading, RN 06/11/2016, 3:27 PM

## 2016-06-11 NOTE — Progress Notes (Signed)
Pharmacy Antibiotic Note  [name] is a 56 y.o. male admitted on 06/10/2016 with cellulitis / pneumonia / fever.  Pharmacy has been consulted for Zithromax, Vancomycin, and Zosyn dosing.  Pt is morbidly obese with elevated SCr.    Plan:  Vancomycin 2000mg  IV today x 1 then 1750mg  IV q24h Check trough at steady state Zosyn 3.375gm IV q8h, EID Zithromax 500mg  IV q24hrs Monitor labs, renal fxn, progress and c/s Deescalate ABX when improved / appropriate.    Height: 6\' 3"  (190.5 cm) Weight: (!) 366 lb 9.6 oz (166.3 kg) IBW/kg (Calculated) : 84.5  Temp (24hrs), Avg:100.3 F (37.9 C), Min:99.3 F (37.4 C), Max:101.9 F (38.8 C)   Recent Labs Lab 06/10/16 1035 06/10/16 1036 06/10/16 1326 06/11/16 0504  WBC 8.6  --   --  5.6  CREATININE 1.96*  --   --  1.90*  LATICACIDVEN  --  1.8 0.9  --     Estimated Creatinine Clearance: 71.1 mL/min (by C-G formula based on SCr of 1.9 mg/dL (H)).    No Known Allergies  Antimicrobials this admission: Vancomycin 1/10 >>  Zosyn 1/10 >>  Zithromax 1/9 >> Bactrim 1/10 x 1 Acyclovir 1/10 >> Tamiflu 1/10 > 1/15 per current plan  Dose adjustments this admission: n/a  Recent Results (from the past 240 hour(s))  Blood culture (routine x 2)     Status: None (Preliminary result)   Collection Time: 06/10/16 10:36 AM  Result Value Ref Range Status   Specimen Description BLOOD BLOOD RIGHT FOREARM  Final   Special Requests BOTTLES DRAWN AEROBIC AND ANAEROBIC 10CC EACH  Final   Culture NO GROWTH 1 DAY  Final   Report Status PENDING  Incomplete  Blood culture (routine x 2)     Status: None (Preliminary result)   Collection Time: 06/10/16 10:43 AM  Result Value Ref Range Status   Specimen Description BLOOD LEFT ANTECUBITAL  Final   Special Requests BOTTLES DRAWN AEROBIC AND ANAEROBIC 10CC EACH  Final   Culture NO GROWTH 1 DAY  Final   Report Status PENDING  Incomplete  Culture, sputum-assessment     Status: None   Collection Time:  06/10/16 11:29 PM  Result Value Ref Range Status   Specimen Description SPUTUM  Final   Special Requests NONE  Final   Sputum evaluation   Final    ABUNDANT WBC PRESENT, PREDOMINANTLY MONONUCLEAR CORRECTED ON 01/10 AT 1314: PREVIOUSLY REPORTED AS THIS SPECIMEN IS ACCEPTABLE FOR SPUTUM CULTURE MODERATE GRAM POSITIVE COCCI IN PAIRS RARE GRAM NEGATIVE RODS Performed at [facility]    Report Status 06/11/2016 FINAL  Final   Thank you for allowing pharmacy to be a part of this patient's care.  Hart Robinsons, PharmD Clinical Pharmacist Pager:  [phone] 06/11/2016   Hart Robinsons A 06/11/2016 1:53 PM

## 2016-06-11 NOTE — Evaluation (Signed)
Physical Therapy Evaluation Patient Details Name: [name] MRN: [id number] DOB: 1960/09/17 Today's Date: 06/11/2016   History of Present Illness  [name] is a 56 y.o. male with medical history significant of morbidly obese, insulin-dependent diabetes, hypertension, chronic diarrhea, chronic kidney disease is stage III, coronary artery disease status post stent in the past, atrial flutter, hyperlipidemia, acid reflux, multiple myeloma status post chemotherapy, chronic lower extremity lymphedema and wounds follow-up outpatient, obstructive sleep apnea uses CPAP presented with worsening lower extremity pain associated with weeping for about a week. Patient also with fever, chills or shortness of breath and productive cough worsened for last 2 days. The cough has greenish phlegm. Associated with abdominal wall pain increases when coughing. Also has sore throat, runny nose. Denied sick contact or recent travel. Denied chest pain, nausea, vomiting. Reported chronic diarrhea. Denies urinary symptoms.  Clinical Impression  Pt received sitting up in the chair, and is agreeable to PT evaluation.  He expressed that he is normally modified independent for ambulation with a cane, and an off loading boot on the L foot.  He is normally independent with ADL's, and IADL's, however I suspect that he requires assistance to don/doff his compression stockings.  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  Pt was able to ambulate 3f with cane and Mod Independent/supervision level.  He does tend to hold onto the wall and other objects in the room due to being off balance with the L boot on his foot.  He does not demonstrate need for further skilled acute PT, however he is recommended for follow up with OPPT for wound care.      Follow Up Recommendations Outpatient PT;Other (comment) (wound care)    Equipment Recommendations  None recommended by PT    Recommendations  for Other Services       Precautions / Restrictions Precautions Precautions: Fall Precaution Comments: Boot slipped when coming up the steps.   Required Braces or Orthoses: Other Brace/Splint Other Brace/Splint: L foot boot for a cyst below great toe - Pt has to have boot on when he is on his feet.        Mobility  Bed Mobility                  Transfers Overall transfer level: Modified independent Equipment used: Straight cane                Ambulation/Gait Ambulation/Gait assistance: Supervision;Modified independent (Device/Increase time) Ambulation Distance (Feet): 60 Feet Assistive device: Straight cane Gait Pattern/deviations: Wide base of support;Antalgic     General Gait Details: Pt uses the cane in the R hand, but is also noted to reach out and furniture walk with the left hand.  He states this is due to the L boot throwing him off balance at times due to it's forefoot off-loading.    Stairs            Wheelchair Mobility    Modified Rankin (Stroke Patients Only)       Balance Overall balance assessment: History of Falls;Needs assistance         Standing balance support: Single extremity supported Standing balance-Leahy Scale: Fair                               Pertinent Vitals/Pain Pain Assessment: 0-10 Pain Score: 5  Pain Location: L knee  Pain Descriptors / Indicators:  Throbbing Pain Intervention(s): Monitored during session;Limited activity within patient's tolerance;RN gave pain meds during session    Home Living   Living Arrangements: Spouse/significant other (dtr - 84 yo) Available Help at Discharge: Available PRN/intermittently Type of Home: House Home Access: Stairs to enter   Entrance Stairs-Number of Steps: front porch is 2, and then 4-5 steps.   Home Layout: Two level Home Equipment: Cane - single point      Prior Function Level of Independence: Independent with assistive device(s)   Gait /  Transfers Assistance Needed: Pt ambulates with the cane when outside the house or going longer distances.  ADL's / Homemaking Assistance Needed: Driving, and working, independent with ADL's, and IADL's.  However, he likely requires assistance for donning/doffing compression stockings as this was very difficult today.   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         Communication   Communication: No difficulties  Cognition Arousal/Alertness: Awake/alert Behavior During Therapy: WFL for tasks assessed/performed Overall Cognitive Status: Within Functional Limits for tasks assessed                      General Comments      Exercises     Assessment/Plan    PT Assessment All further PT needs can be met in the next venue of care  PT Problem List Decreased balance;Decreased mobility;Decreased skin integrity;Obesity;Decreased activity tolerance          PT Treatment Interventions      PT Goals (Current goals can be found in the Care Plan section)  Acute Rehab PT Goals PT Goal Formulation: All assessment and education complete, DC therapy    Frequency     Barriers to discharge        Co-evaluation               End of Session Equipment Utilized During Treatment: Gait belt Activity Tolerance: Patient tolerated treatment well Patient left: in chair;with call bell/phone within reach Nurse Communication: Mobility status (mobility sheet left up in the room. )         Time: 5027-7412 PT Time Calculation (min) (ACUTE ONLY): 38 min   Charges:         PT G Codes:        Beth Romario Tith, PT, DPT X: [phone]

## 2016-06-11 NOTE — Evaluation (Signed)
Occupational Therapy Evaluation Patient Details Name: [name] MRN: [id number] DOB: [birth year] Today's Date: 06/11/2016    History of Present Illness [name] is a 56 y.o. male with medical history significant of morbidly obese, insulin-dependent diabetes, hypertension, chronic diarrhea, chronic kidney disease is stage III, coronary artery disease status post stent in the past, atrial flutter, hyperlipidemia, acid reflux, multiple myeloma status post chemotherapy, chronic lower extremity lymphedema and wounds follow-up outpatient, obstructive sleep apnea uses CPAP presented with worsening lower extremity pain associated with weeping for about a week. Patient also with fever, chills or shortness of breath and productive cough worsened for last 2 days. The cough has greenish phlegm. Associated with abdominal wall pain increases when coughing. Also has sore throat, runny nose. Denied sick contact or recent travel. Denied chest pain, nausea, vomiting. Reported chronic diarrhea. Denies urinary symptoms.   Clinical Impression   Pt awake, alert, oriented x4 this am. Pt demonstrates ADL completion with mod independence, functional mobility at baseline. Wife available to assist with wound dressing changes at home. No further OT services required at this time.     Follow Up Recommendations  No OT follow up    Equipment Recommendations  None recommended by OT       Precautions / Restrictions Precautions Precautions: None Restrictions Weight Bearing Restrictions: No      Mobility Bed Mobility Overal bed mobility: Modified Independent                Transfers Overall transfer level: Modified independent                         ADL Overall ADL's : Modified independent;At baseline     Grooming: Wash/dry hands;Modified independent;Standing                   Toilet Transfer: Modified Building services engineer  and Hygiene: Modified independent;Sit to/from stand       Functional mobility during ADLs: Modified independent                 Pertinent Vitals/Pain Pain Assessment: 0-10 Pain Score: 4  Pain Location: left leg Pain Descriptors / Indicators: Aching Pain Intervention(s): Limited activity within patient's tolerance;Monitored during session;Repositioned     Hand Dominance Right   Extremity/Trunk Assessment Upper Extremity Assessment Upper Extremity Assessment: Overall WFL for tasks assessed   Lower Extremity Assessment Lower Extremity Assessment: Defer to PT evaluation       Communication Communication Communication: No difficulties   Cognition Arousal/Alertness: Awake/alert Behavior During Therapy: WFL for tasks assessed/performed Overall Cognitive Status: Within Functional Limits for tasks assessed                                Home Living Family/patient expects to be discharged to:: Private residence Living Arrangements: Spouse/significant other;Children Available Help at Discharge: Family;Available PRN/intermittently               Bathroom Shower/Tub: Teacher, early years/pre: Standard     Home Equipment: Cane - single point (CPAP)          Prior Functioning/Environment Level of Independence: Independent with assistive device(s)        Comments: Wife assists with wound dressing changes        OT Problem List: Pain    End of Session    Activity Tolerance: Patient tolerated treatment  well Patient left: in chair;with call bell/phone within reach   Time: 1101-1126 OT Time Calculation (min): 25 min Charges:  OT General Charges $OT Visit: 1 Procedure OT Evaluation $OT Eval Low Complexity: 1 Procedure Guadelupe Sabin, OTR/L  [phone] 06/11/2016, 11:32 AM

## 2016-06-12 DIAGNOSIS — L03116 Cellulitis of left lower limb: Secondary | ICD-10-CM

## 2016-06-12 LAB — GLUCOSE, CAPILLARY
Glucose-Capillary: 70 mg/dL (ref 65–99)
Glucose-Capillary: 108 mg/dL — ABNORMAL HIGH (ref 65–99)

## 2016-06-12 LAB — CBC
HCT: 30.2 % — ABNORMAL LOW (ref 39.0–52.0)
Hemoglobin: 9.6 g/dL — ABNORMAL LOW (ref 13.0–17.0)
MCH: 31.3 pg (ref 26.0–34.0)
MCHC: 31.8 g/dL (ref 30.0–36.0)
MCV: 98.4 fL (ref 78.0–100.0)
Platelets: 104 10*3/uL — ABNORMAL LOW (ref 150–400)
RBC: 3.07 MIL/uL — ABNORMAL LOW (ref 4.22–5.81)
RDW: 15.5 % (ref 11.5–15.5)
WBC: 4.7 10*3/uL (ref 4.0–10.5)

## 2016-06-12 LAB — BASIC METABOLIC PANEL
BUN: 29 mg/dL — ABNORMAL HIGH (ref 6–20)
CO2: 26 mmol/L (ref 22–32)
Calcium: 7.5 mg/dL — ABNORMAL LOW (ref 8.9–10.3)
Chloride: 114 mmol/L — ABNORMAL HIGH (ref 101–111)
Creatinine, Ser: 1.74 mg/dL — ABNORMAL HIGH (ref 0.61–1.24)
GFR calc Af Amer: 48 mL/min — ABNORMAL LOW (ref >60)
GFR calc non Af Amer: 42 mL/min — ABNORMAL LOW (ref >60)
Glucose, Bld: 65 mg/dL (ref 65–99)
Potassium: 3.9 mmol/L (ref 3.5–5.1)
Sodium: 141 mmol/L (ref 135–145)

## 2016-06-12 MED ORDER — CEFPODOXIME PROXETIL 200 MG PO TABS: 200.0000 mg | ORAL_TABLET | Freq: Two times a day (BID) | ORAL | 0 refills | Status: DC

## 2016-06-12 MED ORDER — OSELTAMIVIR PHOSPHATE 75 MG PO CAPS: 75.0000 mg | ORAL_CAPSULE | Freq: Two times a day (BID) | ORAL | 0 refills | Status: DC

## 2016-06-12 NOTE — Progress Notes (Signed)
Patient with orders to be discharge home. Discharge instructions given, patient and spouse verbalized understanding. Prescriptions given. Patient stable. Patient left in private vehicle with spouse.

## 2016-06-12 NOTE — Discharge Summary (Signed)
[name] LEX:[id number] DOB: 1961/02/28 DOA: 06/10/2016  PCP: [name], MD  Admit date: 06/10/2016  Discharge date: 06/12/2016  Admitted From: Home   Disposition:  Home   Recommendations for Outpatient Follow-up:   Follow up with PCP in 1-2 weeks  PCP Please obtain BMP/CBC, 2 view CXR in 1week,  (see Discharge instructions)   PCP Please follow up on the following pending results: None   Home Health: None   Equipment/Devices: None  Consultations: None Discharge Condition: Fair   CODE STATUS: Full   Diet Recommendation: Heart healthy, low carbohydrate. 1.5 L fluid restriction in 24 hours   Chief Complaint  Patient presents with  . Wound Infection  . Cough     Brief history of present illness from the day of admission and additional interim summary    [name] is a 56 y.o. male with medical history significant of morbidly obese, insulin-dependent diabetes, hypertension, chronic diarrhea, chronic kidney disease is stage III, coronary artery disease status post stent in the past, atrial flutter, hyperlipidemia, acid reflux, multiple myeloma status post chemotherapy, chronic lower extremity lymphedema and wounds follow-up outpatient, obstructive sleep apnea uses CPAP presented with worsening lower extremity pain associated with weeping for about a week. Patient also with fever, chills or shortness of breath and productive cough worsened for last 2 days. The cough has greenish phlegm. Associated with abdominal wall pain increases when coughing. Also has sore throat, runny nose. Denied sick contact or recent travel. Denied chest pain, nausea, vomiting. Reported chronic diarrhea. Denies urinary symptoms.  Hospital issues addressed     1. Sepsis due to combination of community-acquired pneumonia  from FLU and UTI. Left lower extremity wound appears to be a venous stasis ulcer and noninfected, his left foot wound is old and almost healed again noninfected, he was treated with antibiotics initially for suspected bacterial pneumonia however he continued to have fevers hence influenza panel was checked and positive, he has been started on Tamiflu which will be completed over the next 5 days, he will get Vantin oral for 5 days for UTI as well.  He will be sent to wound care clinic for left lower extremity what appears to be venous stasis ulcer, note patient says that he has had similar problems in the past but not recall if he has seen a vascular surgeon or not, note this ulcer does not look infected, he has recently completed a Bactrim course as well.    2. Flu positive. Added Tamiflu. Approved repeat chest x-ray request PCP to check a chest x-ray next visit.  3. Dyslipidemia. Continue home regimen.  4. Gout. On allopurinol.  5. GERD. On PPI.  6. Chronic pancreatitis. Stable, symptom-free continue pancreatic enzymes with meals.  7. Chr. Systolic CHF EF 44% - compensated. Monitor. Will add low-dose Coreg if blood pressure can tolerate, avoiding ACE/ARB due to acute renal failure.  8. ARF on CKD 4  - due to sepsis , and after hydration now close to baseline.  9. History of  multiple myeloma. Stable follow with oncologist outpatient as before.  10. History of atrial flutter. Currently on xaralto continue. Continue home dose beta blocker.  11. Hx of CAD status post MI. Currently compensated. Continue statin, home dose beta blocker for secondary prevention, continue xaralto  12. DM type II. Continue home regimen follow with PCP.   Discharge diagnosis     Principal Problem:   CAP (community acquired pneumonia) Active Problems:   Insulin dependent diabetes mellitus ([place])   Sleep apnea   Multiple myeloma (HCC)   Arteriosclerotic cardiovascular disease (ASCVD)    Hypertension   Hyperlipidemia   Atrial flutter (HCC)   Chronic systolic heart failure (HCC)   Pneumonia   Cellulitis of left lower extremity    Discharge instructions    Discharge Instructions    Ambulatory referral to Physical Therapy    Complete by:  As directed    Discharge instructions    Complete by:  As directed    Follow with Primary MD [name], MD in 7 days   Get CBC, CMP, 2 view Chest X ray checked  by Primary MD or SNF MD in 5-7 days ( we routinely change or add medications that can affect your baseline labs and fluid status, therefore we recommend that you get the mentioned basic workup next visit with your PCP, your PCP may decide not to get them or add new tests based on their clinical decision)   Activity: As tolerated with Full fall precautions use walker/cane & assistance as needed   Disposition Home     Diet:  Heart Healthy Low Carb.  Accuchecks 4 times/day, Once in AM empty stomach and then before each meal. Log in all results and show them to your Prim.MD in 3 days. If any glucose reading is under 80 or above 300 call your Prim MD immidiately. Follow Low glucose instructions for glucose under 80 as instructed.  Check your Weight same time everyday, if you gain over 2 pounds, or you develop in leg swelling, experience more shortness of breath or chest pain, call your Primary MD immediately. Follow Cardiac Low Salt Diet and 1.5 lit/day fluid restriction.   On your next visit with your primary care physician please Get Medicines reviewed and adjusted.   Please request your Prim.MD to go over all Hospital Tests and Procedure/Radiological results at the follow up, please get all Hospital records sent to your Prim MD by signing hospital release before you go home.   If you experience worsening of your admission symptoms, develop shortness of breath, life threatening emergency, suicidal or homicidal thoughts you must seek medical attention immediately by  calling 911 or calling your MD immediately  if symptoms less severe.  You Must read complete instructions/literature along with all the possible adverse reactions/side effects for all the Medicines you take and that have been prescribed to you. Take any new Medicines after you have completely understood and accpet all the possible adverse reactions/side effects.   Do not drive, operate heavy machinery, perform activities at heights, swimming or participation in water activities or provide baby sitting services if your were admitted for syncope or siezures until you have seen by Primary MD or a Neurologist and advised to do so again.  Do not drive when taking Pain medications.    Do not take more than prescribed Pain, Sleep and Anxiety Medications  Special Instructions: If you have smoked or chewed Tobacco  in the last 2 yrs please stop smoking, stop any regular  Alcohol  and or any Recreational drug use.  Wear Seat belts while driving.   Please note  You were cared for by a hospitalist during your hospital stay. If you have any questions about your discharge medications or the care you received while you were in the hospital after you are discharged, you can call the unit and asked to speak with the hospitalist on call if the hospitalist that took care of you is not available. Once you are discharged, your primary care physician will handle any further medical issues. Please note that NO REFILLS for any discharge medications will be authorized once you are discharged, as it is imperative that you return to your primary care physician (or establish a relationship with a primary care physician if you do not have one) for your aftercare needs so that they can reassess your need for medications and monitor your lab values.   Increase activity slowly    Complete by:  As directed       Discharge Medications   Allergies as of 06/12/2016   No Known Allergies     Medication List    TAKE these  medications   acyclovir 400 MG tablet Commonly known as:  ZOVIRAX TAKE ONE TABLET BY MOUTH EVERY MORNING.   allopurinol 300 MG tablet Commonly known as:  ZYLOPRIM TAKE ONE TABLET BY MOUTH ONCE DAILY.   atorvastatin 80 MG tablet Commonly known as:  LIPITOR TAKE ONE TABLET BY MOUTH AT BEDTIME.   calcitRIOL 0.25 MCG capsule Commonly known as:  ROCALTROL Take 0.25 mcg by mouth 3 (three) times a week. Monday, Wednesday, Friday.   calcium carbonate 600 MG Tabs tablet Commonly known as:  OS-CAL Take 1,800 mg by mouth 2 (two) times daily.   cefpodoxime 200 MG tablet Commonly known as:  VANTIN Take 1 tablet (200 mg total) by mouth 2 (two) times daily.   cholestyramine 4 g packet Commonly known as:  QUESTRAN Take 1 packet (4 g total) by mouth 2 (two) times daily.   CREON 36000 UNITS Cpep capsule Generic drug:  lipase/protease/amylase TAKE 5 CAPSULES BY MOUTH DAILY WITH MEALS AND 2 CAPSULES WITH SNACKS.   dexamethasone 4 MG tablet Commonly known as:  DECADRON TAKE 5 TABLETS BY MOUTH 2 TIMES A DAY EVERY FRIDAY.   dexlansoprazole 60 MG capsule Commonly known as:  DEXILANT Take 1 capsule (60 mg total) by mouth daily.   dicyclomine 20 MG tablet Commonly known as:  BENTYL Take 1 tablet (20 mg total) by mouth 3 (three) times daily before meals.   diphenoxylate-atropine 2.5-0.025 MG tablet Commonly known as:  LOMOTIL TAKE (2) TABLETS BY MOUTH TWICE DAILY.   HYDROcodone-acetaminophen 5-325 MG tablet Commonly known as:  NORCO/VICODIN Take 1-2 tablets by mouth every 4 (four) hours as needed for moderate pain or severe pain.   insulin detemir 100 UNIT/ML injection Commonly known as:  LEVEMIR Inject 40 Units into the skin every morning.   insulin lispro 100 UNIT/ML injection Commonly known as:  HUMALOG Inject 5-30 Units into the skin 2 (two) times daily with a meal. Sliding Scale per patient   lenalidomide 5 MG capsule Commonly known as:  [name] TAKE 1 CAPSULE BY MOUTH DAILY  FOR 7 DAYS ON FOLLOWED BY 7 DAYS OFF.   lisinopril 5 MG tablet Commonly known as:  PRINIVIL,ZESTRIL Take 5 mg by mouth every other day.   loratadine 10 MG tablet Commonly known as:  CLARITIN Take 10 mg by mouth every morning.   metoprolol succinate 25  MG 24 hr tablet Commonly known as:  TOPROL-XL TAKE ONE TABLET BY MOUTH ONCE DAILY.   multivitamins ther. w/minerals Tabs tablet Take 1 tablet by mouth daily.   nitroGLYCERIN 0.4 MG SL tablet Commonly known as:  NITROSTAT Place 1 tablet (0.4 mg total) under the tongue every 5 (five) minutes as needed for chest pain.   oseltamivir 75 MG capsule Commonly known as:  TAMIFLU Take 1 capsule (75 mg total) by mouth 2 (two) times daily.   potassium chloride SA 20 MEQ tablet Commonly known as:  K-DUR,KLOR-CON TAKE 2 TABLETS BY MOUTH TWICE DAILY.   sodium bicarbonate 650 MG tablet Take 1,300 mg by mouth 3 (three) times daily.   sulfamethoxazole-trimethoprim 800-160 MG tablet Commonly known as:  BACTRIM DS,SEPTRA DS TAKE ONE TABLET BY MOUTH EVERY MONDAY, WEDNESDAY, AND FRIDAY.   torsemide 20 MG tablet Commonly known as:  DEMADEX Take 40 mg by mouth daily.   torsemide 20 MG tablet Commonly known as:  DEMADEX TAKE 2 TABLETS BY MOUTH EACH MORNING AND 1 TABLET EACH EVENING.   Vitamin D (Ergocalciferol) 50000 units Caps capsule Commonly known as:  DRISDOL TAKE 1 CAPSULE BY MOUTH EVERY THIRTY DAYS.   XARELTO 20 MG Tabs tablet Generic drug:  rivaroxaban TAKE 1 TABLET BY MOUTH DAILY.       Follow-up Information    [name], MD. Schedule an appointment as soon as possible for a visit in 1 week(s).   Specialty:  Family Medicine Contact information: [address] [place] [address] [phone]           Major procedures and Radiology Reports - PLEASE review detailed and final reports thoroughly  -         Dg Chest 2 View  Result Date: 06/11/2016 CLINICAL DATA:  Shortness of breath and cough for several  days. Left lower lobe pneumonia. Multiple myeloma. EXAM: CHEST  2 VIEW COMPARISON:  06/10/2016 and 06/25/2015 FINDINGS: Previously seen left lower lung airspace opacity is decreased since previous study. Left diaphragmatic hernia again seen containing the splenic flexure of colon. Right lung is clear. No evidence of pneumothorax or pleural effusion. Heart size is stable. Right-sided power port remains in appropriate position. IMPRESSION: Decreased left lower lung airspace opacity since previous study. Stable left diaphragmatic hernia containing splenic flexure of colon. Electronically Signed   By: Earle Gell M.D.   On: 06/11/2016 16:27   Dg Chest 2 View  Result Date: 06/10/2016 CLINICAL DATA:  Cough and congestion for 2 days with fevers EXAM: CHEST  2 VIEW COMPARISON:  06/25/2015 FINDINGS: Cardiac shadow is within normal limits. Significant left basilar infiltrate is noted projecting in the left lower lobe anteriorly. No sizable effusion is noted. No bony abnormality is seen. Right chest port is again noted. IMPRESSION: Left basilar pneumonia. Electronically Signed   By: Inez Catalina M.D.   On: 06/10/2016 11:09    Micro Results     Recent Results (from the past 240 hour(s))  Blood culture (routine x 2)     Status: None (Preliminary result)   Collection Time: 06/10/16 10:36 AM  Result Value Ref Range Status   Specimen Description BLOOD BLOOD RIGHT FOREARM  Final   Special Requests BOTTLES DRAWN AEROBIC AND ANAEROBIC 10CC EACH  Final   Culture NO GROWTH 2 DAYS  Final   Report Status PENDING  Incomplete  Blood culture (routine x 2)     Status: None (Preliminary result)   Collection Time: 06/10/16 10:43 AM  Result Value Ref Range Status   Specimen Description BLOOD LEFT ANTECUBITAL  Final   Special Requests BOTTLES DRAWN AEROBIC AND ANAEROBIC 10CC EACH  Final   Culture NO GROWTH 2 DAYS  Final   Report Status PENDING  Incomplete  Urine culture     Status: Abnormal (Preliminary result)    Collection Time: 06/10/16 11:43 AM  Result Value Ref Range Status   Specimen Description URINE, CATHETERIZED  Final   Special Requests NONE  Final   Culture >=100,000 COLONIES/mL GRAM NEGATIVE RODS (A)  Final   Report Status PENDING  Incomplete  Culture, sputum-assessment     Status: None   Collection Time: 06/10/16 11:29 PM  Result Value Ref Range Status   Specimen Description SPUTUM  Final   Special Requests NONE  Final   Sputum evaluation   Final    ABUNDANT WBC PRESENT, PREDOMINANTLY MONONUCLEAR CORRECTED ON 01/10 AT 1314: PREVIOUSLY REPORTED AS THIS SPECIMEN IS ACCEPTABLE FOR SPUTUM CULTURE MODERATE GRAM POSITIVE COCCI IN PAIRS RARE GRAM NEGATIVE RODS Performed at [facility]    Report Status 06/11/2016 FINAL  Final  Respiratory Panel by PCR     Status: Abnormal   Collection Time: 06/10/16 11:58 PM  Result Value Ref Range Status   Adenovirus NOT DETECTED NOT DETECTED Final   Coronavirus 229E NOT DETECTED NOT DETECTED Final   Coronavirus HKU1 NOT DETECTED NOT DETECTED Final   Coronavirus NL63 NOT DETECTED NOT DETECTED Final   Coronavirus OC43 NOT DETECTED NOT DETECTED Final   Metapneumovirus NOT DETECTED NOT DETECTED Final   Rhinovirus / Enterovirus NOT DETECTED NOT DETECTED Final   Influenza A NOT DETECTED NOT DETECTED Final   Influenza A H1 NOT DETECTED NOT DETECTED Final   Influenza A H1 2009 NOT DETECTED NOT DETECTED Final   Influenza A H3 DETECTED (A) NOT DETECTED Final   Influenza B NOT DETECTED NOT DETECTED Final   Parainfluenza Virus 1 NOT DETECTED NOT DETECTED Final   Parainfluenza Virus 2 NOT DETECTED NOT DETECTED Final   Parainfluenza Virus 3 NOT DETECTED NOT DETECTED Final   Parainfluenza Virus 4 NOT DETECTED NOT DETECTED Final   Respiratory Syncytial Virus NOT DETECTED NOT DETECTED Final   Bordetella pertussis NOT DETECTED NOT DETECTED Final   Chlamydophila pneumoniae NOT DETECTED NOT DETECTED Final   Mycoplasma pneumoniae NOT DETECTED NOT DETECTED  Final    Comment: Performed at [facility]    Today   [place] today has no headache,no chest abdominal pain,no new weakness tingling or numbness, feels much better wants to go home today.     Objective   Blood pressure (!) 111/41, pulse (!) 56, temperature 98.8 F (37.1 C), temperature source Oral, resp. rate 16, height '6\' 3"'  (1.905 m), weight (!) 166.3 kg (366 lb 9.6 oz), SpO2 100 %.   Intake/Output Summary (Last 24 hours) at 06/12/16 0930 Last data filed at 06/12/16 0339  Gross per 24 hour  Intake           1382.5 ml  Output                0 ml  Net           1382.5 ml    Exam Awake Alert, Oriented x 3, No new F.N deficits, Normal affect [place].AT,PERRAL Supple Neck,No JVD, No cervical lymphadenopathy appriciated.  Symmetrical Chest wall movement, Good air movement bilaterally, CTAB RRR,No Gallops,Rubs or new Murmurs, No Parasternal Heave +ve B.Sounds, Abd Soft, Non  tender, No organomegaly appriciated, No rebound -guarding or rigidity. No Cyanosis, Clubbing or edema, No new Rash or bruise, Right calf and right foot under bandage, no surrounding cellulitis   Data Review   CBC w Diff: Lab Results  Component Value Date   WBC 4.7 06/12/2016   HGB 9.6 (L) 06/12/2016   HCT 30.2 (L) 06/12/2016   PLT 104 (L) 06/12/2016   LYMPHOPCT 9 06/10/2016   MONOPCT 8 06/10/2016   EOSPCT 3 06/10/2016   BASOPCT 0 06/10/2016    CMP: Lab Results  Component Value Date   NA 141 06/12/2016   K 3.9 06/12/2016   CL 114 (H) 06/12/2016   CO2 26 06/12/2016   BUN 29 (H) 06/12/2016   CREATININE 1.74 (H) 06/12/2016   CREATININE 2.24 (H) 10/05/2012   PROT 5.9 (L) 06/10/2016   ALBUMIN 3.4 (L) 06/10/2016   BILITOT 0.8 06/10/2016   ALKPHOS 89 06/10/2016   AST 33 06/10/2016   ALT 41 06/10/2016  .   Total Time in preparing paper work, data evaluation and todays exam - 35 minutes  Thurnell Lose M.D on 06/12/2016 at 9:30 AM  [facility]   Office   [phone]

## 2016-06-12 NOTE — Discharge Instructions (Signed)
Follow with Primary MD [name], MD in 7 days   Get CBC, CMP, 2 view Chest X ray checked  by Primary MD or SNF MD in 5-7 days ( we routinely change or add medications that can affect your baseline labs and fluid status, therefore we recommend that you get the mentioned basic workup next visit with your PCP, your PCP may decide not to get them or add new tests based on their clinical decision)   Activity: As tolerated with Full fall precautions use walker/cane & assistance as needed   Disposition Home     Diet:  Heart Healthy Low Carb.  Accuchecks 4 times/day, Once in AM empty stomach and then before each meal. Log in all results and show them to your Prim.MD in 3 days. If any glucose reading is under 80 or above 300 call your Prim MD immidiately. Follow Low glucose instructions for glucose under 80 as instructed.  Check your Weight same time everyday, if you gain over 2 pounds, or you develop in leg swelling, experience more shortness of breath or chest pain, call your Primary MD immediately. Follow Cardiac Low Salt Diet and 1.5 lit/day fluid restriction.   On your next visit with your primary care physician please Get Medicines reviewed and adjusted.   Please request your Prim.MD to go over all Hospital Tests and Procedure/Radiological results at the follow up, please get all Hospital records sent to your Prim MD by signing hospital release before you go home.   If you experience worsening of your admission symptoms, develop shortness of breath, life threatening emergency, suicidal or homicidal thoughts you must seek medical attention immediately by calling 911 or calling your MD immediately  if symptoms less severe.  You Must read complete instructions/literature along with all the possible adverse reactions/side effects for all the Medicines you take and that have been prescribed to you. Take any new Medicines after you have completely understood and accpet all the possible  adverse reactions/side effects.   Do not drive, operate heavy machinery, perform activities at heights, swimming or participation in water activities or provide baby sitting services if your were admitted for syncope or siezures until you have seen by Primary MD or a Neurologist and advised to do so again.  Do not drive when taking Pain medications.    Do not take more than prescribed Pain, Sleep and Anxiety Medications  Special Instructions: If you have smoked or chewed Tobacco  in the last 2 yrs please stop smoking, stop any regular Alcohol  and or any Recreational drug use.  Wear Seat belts while driving.   Please note  You were cared for by a hospitalist during your hospital stay. If you have any questions about your discharge medications or the care you received while you were in the hospital after you are discharged, you can call the unit and asked to speak with the hospitalist on call if the hospitalist that took care of you is not available. Once you are discharged, your primary care physician will handle any further medical issues. Please note that NO REFILLS for any discharge medications will be authorized once you are discharged, as it is imperative that you return to your primary care physician (or establish a relationship with a primary care physician if you do not have one) for your aftercare needs so that they can reassess your need for medications and monitor your lab values.

## 2016-06-13 LAB — URINE CULTURE: Culture: >=100000 — AB

## 2016-06-13 LAB — CULTURE, RESPIRATORY W GRAM STAIN: Culture: NORMAL

## 2016-06-15 LAB — CULTURE, BLOOD (ROUTINE X 2)
Culture: NO GROWTH
Culture: NO GROWTH

## 2016-06-17 ENCOUNTER — Encounter (HOSPITAL_COMMUNITY): Payer: Managed Care, Other (non HMO)

## 2016-06-19 ENCOUNTER — Ambulatory Visit (HOSPITAL_COMMUNITY): Payer: BLUE CROSS/BLUE SHIELD | Admitting: Physical Therapy

## 2016-06-23 ENCOUNTER — Other Ambulatory Visit (HOSPITAL_COMMUNITY): Payer: Self-pay | Admitting: Oncology

## 2016-06-24 ENCOUNTER — Ambulatory Visit (INDEPENDENT_AMBULATORY_CARE_PROVIDER_SITE_OTHER): Payer: BLUE CROSS/BLUE SHIELD | Admitting: Internal Medicine

## 2016-06-24 ENCOUNTER — Encounter (INDEPENDENT_AMBULATORY_CARE_PROVIDER_SITE_OTHER): Payer: Self-pay | Admitting: Internal Medicine

## 2016-06-24 VITALS — BP 102/72 | HR 68 | Temp 98.3°F | Resp 18 | Ht 72.0 in | Wt 365.3 lb

## 2016-06-24 DIAGNOSIS — K903 Pancreatic steatorrhea: Secondary | ICD-10-CM

## 2016-06-24 DIAGNOSIS — K219 Gastro-esophageal reflux disease without esophagitis: Secondary | ICD-10-CM

## 2016-06-24 DIAGNOSIS — K580 Irritable bowel syndrome with diarrhea: Secondary | ICD-10-CM

## 2016-06-24 MED ORDER — CHOLESTYRAMINE 4 G PO PACK: 4.0000 g | PACK | Freq: Two times a day (BID) | ORAL | 2 refills | Status: DC

## 2016-06-24 MED ORDER — DIPHENOXYLATE-ATROPINE 2.5-0.025 MG PO TABS: ORAL_TABLET | ORAL | 5 refills | Status: DC

## 2016-06-24 NOTE — Patient Instructions (Addendum)
Call if you have another two episodes of right upper quadrant abdominal pain.

## 2016-06-24 NOTE — Progress Notes (Signed)
Presenting complaint;  Follow-up for steatorrhea GERD and diarrhea.  Subjective:  [name] is 56 year old African-American male with multiple GI problems was here for scheduled visit. He was last seen on 02/26/2016. He only has lost 3 pounds since his last visit which is felt to be due to decrease in lower extremity edema. He feels some better. He states he has had 10-12 formed stool since his last visit. On most days he has 3-4 stools. Stool consistency varies between liquid and soft. Most of his bowel movements occur within 30 minutes of each meal. He denies nocturnal diarrhea. He continues to complain of bloating and intermittent upper abdominal pain. He has not experienced nausea or vomiting. He feels heartburn is well controlled with PPI. He says even the hospital 2 weeks ago for 3 days he was there for cellulitis but then he was found to have pneumonia fluid and urinary tract infection. He will need new prescription for diphenoxylate and Questran. He has not taken Questran because he is been confused about the timing.  Current Medications: Outpatient Encounter Prescriptions as of 06/24/2016  Medication Sig  . acyclovir (ZOVIRAX) 400 MG tablet TAKE ONE TABLET BY MOUTH EVERY MORNING.  Marland Kitchen allopurinol (ZYLOPRIM) 300 MG tablet TAKE ONE TABLET BY MOUTH ONCE DAILY.  Marland Kitchen atorvastatin (LIPITOR) 80 MG tablet TAKE ONE TABLET BY MOUTH AT BEDTIME.  . calcitRIOL (ROCALTROL) 0.25 MCG capsule Take 0.25 mcg by mouth 3 (three) times a week. Monday, Wednesday, Friday.  . calcium carbonate (OS-CAL) 600 MG TABS tablet Take 1,800 mg by mouth 2 (two) times daily.   . cefpodoxime (VANTIN) 200 MG tablet Take 1 tablet (200 mg total) by mouth 2 (two) times daily.  . cholestyramine (QUESTRAN) 4 g packet Take 1 packet (4 g total) by mouth 2 (two) times daily.  Marland Kitchen CREON 36000 units CPEP capsule TAKE 5 CAPSULES BY MOUTH DAILY WITH MEALS AND 2 CAPSULES WITH SNACKS.  Marland Kitchen dexamethasone (DECADRON) 4 MG tablet TAKE 5 TABLETS BY MOUTH 2  TIMES A DAY EVERY FRIDAY.  Marland Kitchen dexlansoprazole (DEXILANT) 60 MG capsule Take 1 capsule (60 mg total) by mouth daily.  Marland Kitchen dicyclomine (BENTYL) 20 MG tablet Take 1 tablet (20 mg total) by mouth 3 (three) times daily before meals.  . diphenoxylate-atropine (LOMOTIL) 2.5-0.025 MG tablet TAKE (2) TABLETS BY MOUTH TWICE DAILY.  Marland Kitchen HYDROcodone-acetaminophen (NORCO/VICODIN) 5-325 MG per tablet Take 1-2 tablets by mouth every 4 (four) hours as needed for moderate pain or severe pain.  Marland Kitchen insulin detemir (LEVEMIR) 100 UNIT/ML injection Inject 40 Units into the skin every morning.   . insulin lispro (HUMALOG) 100 UNIT/ML injection Inject 5-30 Units into the skin 2 (two) times daily with a meal. Sliding Scale per patient  . lenalidomide ([name]) 5 MG capsule TAKE 1 CAPSULE BY MOUTH DAILY FOR 7 DAYS ON FOLLOWED BY 7 DAYS OFF.  Marland Kitchen lisinopril (PRINIVIL,ZESTRIL) 5 MG tablet Take 5 mg by mouth every other day.  . loratadine (CLARITIN) 10 MG tablet Take 10 mg by mouth every morning.   . metoprolol succinate (TOPROL-XL) 25 MG 24 hr tablet TAKE ONE TABLET BY MOUTH ONCE DAILY.  . Multiple Vitamins-Minerals (MULTIVITAMINS THER. W/MINERALS) TABS Take 1 tablet by mouth daily.   . nitroGLYCERIN (NITROSTAT) 0.4 MG SL tablet Place 1 tablet (0.4 mg total) under the tongue every 5 (five) minutes as needed for chest pain.  . potassium chloride SA (K-DUR,KLOR-CON) 20 MEQ tablet TAKE 2 TABLETS BY MOUTH TWICE DAILY.  . sodium bicarbonate 650 MG tablet Take 1,300  mg by mouth 3 (three) times daily.  Marland Kitchen sulfamethoxazole-trimethoprim (BACTRIM DS,SEPTRA DS) 800-160 MG tablet TAKE ONE TABLET BY MOUTH EVERY MONDAY, WEDNESDAY, AND FRIDAY.  Marland Kitchen torsemide (DEMADEX) 20 MG tablet Take 40 mg by mouth daily.  Marland Kitchen torsemide (DEMADEX) 20 MG tablet TAKE 2 TABLETS BY MOUTH EACH MORNING AND 1 TABLET EACH EVENING.  . Vitamin D, Ergocalciferol, (DRISDOL) 50000 units CAPS capsule TAKE 1 CAPSULE BY MOUTH EVERY THIRTY DAYS.  Marland Kitchen XARELTO 20 MG TABS tablet TAKE 1  TABLET BY MOUTH DAILY.  . [DISCONTINUED] oseltamivir (TAMIFLU) 75 MG capsule Take 1 capsule (75 mg total) by mouth 2 (two) times daily. (Patient not taking: Reported on 06/24/2016)   Facility-Administered Encounter Medications as of 06/24/2016  Medication  . sodium chloride 0.9 % injection 10 mL     Objective: Blood pressure 102/72, pulse 68, temperature 98.3 F (36.8 C), temperature source Oral, resp. rate 18, height 6' (1.829 m), weight (!) 365 lb 4.8 oz (165.7 kg). Patient is alert and in no acute distress. Conjunctiva is pink. Sclera is nonicteric Oropharyngeal mucosa is normal. No neck masses or thyromegaly noted. Cardiac exam with regular rhythm normal S1 and S2. No murmur or gallop noted. Lungs are clear to auscultation. Abdomen is obese. Bowel sounds are normal. Abdomen is soft. Percussion note is tympanic in epigastric region. No organomegaly or masses. He has 1-2+ pitting edema involving both legs. He has Ace wrap on. He has surgical left boot.    Assessment:  #1. Pancreatic insufficiency. He is on moderate to high-dose of pancreatic enzyme supplement. He is maintaining his weight. Diarrhea appears to be due to IBS. #2. Chronic diarrhea is by therapy for pancreatic insufficiency. Diarrhea appears to be due to IBS. #3. Chronic GERD. Heartburn well controlled with therapy. #4. Cholelithiasis. So far I'm not convinced that he has any symptoms pertaining to gallstones. Will continue to monitor closely.  Plan:   New prescription given for diphenoxylate 2 tablets by mouth twice a day for 1 month with 2 refills.  Prescription also given for Questran 4 g by mouth twice a day. Patient instructed on how to take this medication. Patient will call if he has more than 2 episodes of right upper quadrant abdominal pain. Office visit in 4 months.

## 2016-06-25 ENCOUNTER — Telehealth (HOSPITAL_COMMUNITY): Payer: Self-pay | Admitting: Physical Therapy

## 2016-06-25 ENCOUNTER — Ambulatory Visit (HOSPITAL_COMMUNITY): Payer: BLUE CROSS/BLUE SHIELD | Attending: Internal Medicine | Admitting: Physical Therapy

## 2016-06-25 DIAGNOSIS — L97221 Non-pressure chronic ulcer of left calf limited to breakdown of skin: Secondary | ICD-10-CM | POA: Diagnosis present

## 2016-06-25 DIAGNOSIS — M79662 Pain in left lower leg: Secondary | ICD-10-CM | POA: Diagnosis present

## 2016-06-25 DIAGNOSIS — R262 Difficulty in walking, not elsewhere classified: Secondary | ICD-10-CM | POA: Insufficient documentation

## 2016-06-25 NOTE — Therapy (Signed)
[place], [place], [address] Phone: [phone]   Fax:  [phone]  Wound Care Evaluation  Patient Details  Name: CALIEB LICHTMAN MRN: [id number] Date of Birth: 05-29-1961 No Data Recorded  Encounter Date: 06/25/2016      PT End of Session - 06/25/16 1754    Visit Number 1   Number of Visits 9   Date for PT Re-Evaluation 07/23/16   Authorization Type [employer] PPO    Authorization Time Period 06/25/16 to 07/26/16   PT Start Time 1652   PT Stop Time 1730   PT Time Calculation (min) 38 min   Activity Tolerance Patient tolerated treatment well   Behavior During Therapy [facility] for tasks assessed/performed      Past Medical History:  Diagnosis Date  . Arteriosclerotic cardiovascular disease (ASCVD)    MI-2000s; stent to the proximal LAD and diagonal in 2001; stress nuclear in 2008-impaired exercise capacity, left ventricular dilatation, moderately to severely depressed EF, apical, inferior and anteroseptal scar  . Atrial flutter ([place])   . 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 Cellulitis of leg    both legs  . Chronic diarrhea   . Chronic kidney disease, stage 3, mod decreased GFR    Creatinine of 1.84 in 06/2011 and 1.5 in 07/2011  . Diabetes mellitus    Insulin  . GERD (gastroesophageal reflux disease)   . Gout   . Hyperlipidemia   . Hypertension   . Injection site reaction   . Multiple myeloma 07/01/2011  . Myocardial infarction 2000  . Obesity   . Pedal edema    Venous insufficiency  . Sleep apnea    uses cpap  . Ulcer [facility])     Past Surgical History:  Procedure Laterality Date  . ABSCESS DRAINAGE     Scrotal  . BIOPSY  01/02/2012   Procedure: BIOPSY;  Surgeon: Rogene Houston, MD;  Location[facility];  Service: Endoscopy;  Laterality: N/A;  . BONE MARROW BIOPSY  05/13/11  . CARDIAC CATHETERIZATION     cardiac stent  . CARDIOVERSION N/A 10/13/2012   Procedure: CARDIOVERSION;  Surgeon: Yehuda Savannah, MD;  Location: [facility] ORS;  Service: Cardiovascular;  Laterality: N/A;  . CATARACT EXTRACTION W/PHACO Left 02/13/2014   Procedure: CATARACT EXTRACTION PHACO AND INTRAOCULAR LENS PLACEMENT (Ideal);  Surgeon: Tonny Branch, MD;  Location: [facility] ORS;  Service: Ophthalmology;  Laterality: Left;  CDE:  7.67  . CATARACT EXTRACTION W/PHACO Right 03/02/2014   Procedure: CATARACT EXTRACTION PHACO AND INTRAOCULAR LENS PLACEMENT RIGHT EYE CDE=16.81;  Surgeon: Tonny Branch, MD;  Location: [facility] ORS;  Service: Ophthalmology;  Laterality: Right;  . COLONOSCOPY  11/28/2011   Procedure: COLONOSCOPY;  Surgeon: Rogene Houston, MD;  [facility];  Service: Endoscopy;  Laterality: N/A;  930  . ESOPHAGOGASTRODUODENOSCOPY  01/02/2012   Procedure: ESOPHAGOGASTRODUODENOSCOPY (EGD);  Surgeon: Rogene Houston, MD;  [facility];  Service: Endoscopy;  Laterality: N/A;  100  . ESOPHAGOGASTRODUODENOSCOPY N/A 09/20/2012   Procedure: ESOPHAGOGASTRODUODENOSCOPY (EGD);  Surgeon: Rogene Houston, MD;  [facility];  Service: Endoscopy;  Laterality: N/A;  . EUS N/A 10/07/2012   Procedure: UPPER ENDOSCOPIC ULTRASOUND (EUS) LINEAR;  Surgeon: Milus Banister, MD;  Location: [facility];  Service: Endoscopy;  Laterality: N/A;  . INCISION AND DRAINAGE ABSCESS ANAL    . LAPAROSCOPIC GASTRIC BANDING  2006   has been removed  . PORT-A-CATH REMOVAL Left 12/07/2012   Procedure: REMOVAL  PORT-A-CATH;  Surgeon: Scherry Ran, MD;  Location: [facility];  Service: General;  Laterality: Left;  . PORTACATH PLACEMENT  07/07/2011   Procedure: INSERTION PORT-A-CATH;  Surgeon: Scherry Ran, MD;  Location: [facility];  Service: General;  Laterality: N/A;  . PORTACATH PLACEMENT N/A 12/07/2012   Procedure: INSERTION PORT-A-CATH;  Surgeon: Scherry Ran, MD;  Location: [facility];  Service: General;  Laterality: N/A;  Attempted portacath placement on left and right side  . WRIST SURGERY     Left; removal of bone fragment    There  were no vitals filed for this visit.        [facility] PT Assessment - 06/25/16 0001      Balance Screen   Has the patient fallen in the past 6 months Yes   How many times? 1   Has the patient had a decrease in activity level because of a fear of falling?  No   Is the patient reluctant to leave their home because of a fear of falling?  No     Prior Function   Level of Independence Independent;Independent with basic ADLs;Independent with gait;Independent with transfers;Requires assistive device for independence         Wound Therapy - 06/25/16 1745    Subjective Patient reports that a few weeks ago they noticed a bubble appear on L LE, which popped and continued to get worse. THey were eventually given silvadene to put on it and they report it has gotten much better. It is very tender, and patient reports his main goal is wound healing.    Patient and Family Stated Goals wound to heal    Date of Onset --  several weeks ago    Prior Treatments silvadene from MD    Pain Assessment 0-10   Pain Score 4    Pain Type Chronic pain   Pain Location Leg   Pain Orientation Left;Anterior;Lower   Pain Descriptors / Indicators Discomfort   Pain Onset On-going   Evaluation and Treatment Procedures Explained to Patient/Family Yes   Evaluation and Treatment Procedures agreed to   Wound Properties Date First Assessed: 06/25/16 Wound Type: Non-pressure wound Location: Leg Location Orientation: Left;Anterior;Lower Wound Description (Comments): L anterior shin, medial  Present on Admission: Yes   Dressing Type Other (Comment)  silvadene and bandaid    Dressing Changed Changed   Dressing Status Clean;Dry   Dressing Change Frequency PRN   % Wound base Red or Granulating 100%  after debridement    % Wound base Yellow/Fibrinous Exudate 0%   % Wound base Black/Eschar 0%   Wound Length (cm) 1.2 cm   Wound Width (cm) 2.3 cm   Wound Depth (cm) --  skin depth    Drainage Amount None   Treatment  Cleansed;Debridement (Selective);Packing (Impregnated strip);Other (Comment)  debridment, xeroform, gauze, medipore    Wound Properties Date First Assessed: 06/25/16 Wound Type: Non-pressure wound Location: Leg Location Orientation: Left;Anterior;Lower Wound Description (Comments): L anterior shin, lateral  Present on Admission: Yes   Dressing Type Other (Comment)  silvadene, bandaid    Dressing Changed Changed   Dressing Status Clean;Dry   Dressing Change Frequency PRN   % Wound base Red or Granulating 100%   % Wound base Yellow/Fibrinous Exudate 0%   % Wound base Black/Eschar 0%   Wound Length (cm) 1.5 cm   Wound Width (cm) 0.2 cm   Wound Depth (cm) --  skin depth    Drainage Amount None   Treatment Cleansed;Debridement (  Selective);Packing (Impregnated strip);Other (Comment)  debridement, xeroform, gauze, tape    Selective Debridement - Location L anterior LE wounds    Selective Debridement - Tools Used Forceps   Selective Debridement - Tissue Removed slough/non-healing tissue    Wound Therapy - Clinical Statement Patient reports that a few weeks ago they noticed a bubble appear on his anterior left leg, which then popped and got progressively worse over time; they have been putting silvadene on and putting a bandaid over the wound, and he/his wife report it has gotten much better overall. It is very tender, and he reports that he does wear compression stockings at baseline. Examination reveals a shallow wound to anterior L LE, able to be completely debrided of slough today revealing healthy granulated tissue with no significant depth noted. Dressed with xeroform and gauze/medipore and netting today; did not apply profore as patient currently has compression stocking on but may consider in future if wound healing appears to stall. Patient reports a second wound on his foot (appears to be related to a different causative factor) and DPT plans to put in order so we can begin addressing this as  well. Recommend skilled PT services to facilitate full wound healing with potential transition to lymphedema therapy after wounds are resolved.    Wound Therapy - Functional Problem List leg pain and difficulty walking, non-healing wound    Factors Delaying/Impairing Wound Healing Diabetes Mellitus;Multiple medical problems;Immobility;Polypharmacy;Vascular compromise   Wound Therapy - Frequency Other (comment)  2x/week for 4 weeks    Wound Therapy - Current Recommendations PT   Wound Plan debridemnet PRN, xeroform; may consider profore dressing if healing apperas to stall. Follow up on order for foot wound.                          PT Education - 06/25/16 1752    Education provided Yes   Education Details prognosis, POC; continue with "bird baths" for now; we will submit order for L foot wound, also talk to MD; only change dressing if it falls off on its own    Person(s) Educated Patient;Spouse   Methods Explanation   Comprehension Verbalized understanding;Returned demonstration;Need further instruction          PT Short Term Goals - 06/25/16 1754      PT SHORT TERM GOAL #1   Title Patient to demonstrate 100% granulation tissue with no need for debridement in order to show general improvement of condition    Time 2   Period Weeks   Status New     PT SHORT TERM GOAL #2   Title Patient to experience pain no more than 1/10 in wound area in order to show general improvement of condition    Time 2   Period Weeks   Status New           PT Long Term Goals - 06/25/16 1755      PT LONG TERM GOAL #1   Title Patient to demonstrate full wound healing in order to show resolution of condition    Time 4   Period Weeks   Status New     PT LONG TERM GOAL #2   Title Patient and family to be independent in skin care strategies and edema control in order to assist in prevention of other wound formation    Time 4   Period Weeks   Status New            Patient  will benefit from skilled therapeutic intervention in order to improve the following deficits and impairments:     Visit Diagnosis: Non-pressure chronic ulcer of calf, limited to breakdown of skin, left ([place]) - Plan: PT plan of care cert/re-cert  Pain in left lower leg - Plan: PT plan of care cert/re-cert  Difficulty in walking, not elsewhere classified - Plan: PT plan of care cert/re-cert    Problem List Patient Active Problem List   Diagnosis Date Noted  . Pneumonia 06/10/2016  . CAP (community acquired pneumonia) 06/10/2016  . Cellulitis of left lower extremity   . Hypogammaglobulinemia ([place]) 03/09/2016  . Hypokalemia 03/09/2016  . Diaphragmatic hernia without obstruction 02/26/2016  . Chronic systolic heart failure ([place]) 12/19/2013  . Anemia, normocytic normochromic 10/09/2012  . Chronic pancreatitis ([place]) 10/07/2012  . Atrial flutter ([place]) 09/17/2012  . DDD (degenerative disc disease), cervical 03/18/2012  . Arteriosclerotic cardiovascular disease (ASCVD)   . Hypertension   . Hyperlipidemia   . Multiple myeloma ([place]) 07/01/2011  . Morbid obesity ([place]) 04/29/2010  . Insulin dependent diabetes mellitus ([place]) 11/15/2008  . Sleep apnea 11/15/2008    Deniece Ree PT, DPT [place] [address] [place], [place], [address] Phone: [phone]   Fax:  [phone]  Name: DEMAREE LIBERTO MRN: [id number] Date of Birth: [birth year]

## 2016-06-25 NOTE — Telephone Encounter (Signed)
Placed order for L foot wound in outgoing fax box.    Deniece Ree PT, DPT [phone]

## 2016-06-27 ENCOUNTER — Ambulatory Visit (HOSPITAL_COMMUNITY): Payer: BLUE CROSS/BLUE SHIELD

## 2016-06-27 DIAGNOSIS — L97221 Non-pressure chronic ulcer of left calf limited to breakdown of skin: Secondary | ICD-10-CM

## 2016-06-27 DIAGNOSIS — M79662 Pain in left lower leg: Secondary | ICD-10-CM

## 2016-06-27 DIAGNOSIS — R262 Difficulty in walking, not elsewhere classified: Secondary | ICD-10-CM

## 2016-06-27 NOTE — Therapy (Signed)
[place], [place], [address] Phone: [phone]   Fax:  [phone]  Wound Care Therapy  Patient Details  Name: [name] MRN: [id number] Date of Birth: 04/18/1961 No Data Recorded  Encounter Date: 06/27/2016      PT End of Session - 06/27/16 1741    Visit Number 2   Number of Visits 9   Date for PT Re-Evaluation 07/23/16   Authorization Type [employer] PPO    Authorization Time Period 06/25/16 to 07/26/16   PT Start Time 1703   PT Stop Time 1728   PT Time Calculation (min) 25 min   Activity Tolerance Patient tolerated treatment well   Behavior During Therapy [facility] for tasks assessed/performed      Past Medical History:  Diagnosis Date  . Arteriosclerotic cardiovascular disease (ASCVD)    MI-2000s; stent to the proximal LAD and diagonal in 2001; stress nuclear in 2008-impaired exercise capacity, left ventricular dilatation, moderately to severely depressed EF, apical, inferior and anteroseptal scar  . Atrial flutter ([place])   . 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 Cellulitis of leg    both legs  . Chronic diarrhea   . Chronic kidney disease, stage 3, mod decreased GFR    Creatinine of 1.84 in 06/2011 and 1.5 in 07/2011  . Diabetes mellitus    Insulin  . GERD (gastroesophageal reflux disease)   . Gout   . Hyperlipidemia   . Hypertension   . Injection site reaction   . Multiple myeloma 07/01/2011  . Myocardial infarction 2000  . Obesity   . Pedal edema    Venous insufficiency  . Sleep apnea    uses cpap  . Ulcer [facility])     Past Surgical History:  Procedure Laterality Date  . ABSCESS DRAINAGE     Scrotal  . BIOPSY  01/02/2012   Procedure: BIOPSY;  Surgeon: Rogene Houston, MD;  [facility];  Service: Endoscopy;  Laterality: N/A;  . BONE MARROW BIOPSY  05/13/11  . CARDIAC CATHETERIZATION     cardiac stent  . CARDIOVERSION N/A 10/13/2012   Procedure: CARDIOVERSION;  Surgeon: Yehuda Savannah, MD;  Location: [facility] ORS;  Service: Cardiovascular;  Laterality: N/A;  . CATARACT EXTRACTION W/PHACO Left 02/13/2014   Procedure: CATARACT EXTRACTION PHACO AND INTRAOCULAR LENS PLACEMENT ([place]);  Surgeon: Tonny Branch, MD;  Location: [facility] ORS;  Service: Ophthalmology;  Laterality: Left;  CDE:  7.67  . CATARACT EXTRACTION W/PHACO Right 03/02/2014   Procedure: CATARACT EXTRACTION PHACO AND INTRAOCULAR LENS PLACEMENT RIGHT EYE CDE=16.81;  Surgeon: Tonny Branch, MD;  Location: [facility] ORS;  Service: Ophthalmology;  Laterality: Right;  . COLONOSCOPY  11/28/2011   Procedure: COLONOSCOPY;  Surgeon: Rogene Houston, MD;  Location: [facility] ENDO SUITE;  Service: Endoscopy;  Laterality: N/A;  930  . ESOPHAGOGASTRODUODENOSCOPY  01/02/2012   Procedure: ESOPHAGOGASTRODUODENOSCOPY (EGD);  Surgeon: Rogene Houston, MD;  [facility];  Service: Endoscopy;  Laterality: N/A;  100  . ESOPHAGOGASTRODUODENOSCOPY N/A 09/20/2012   Procedure: ESOPHAGOGASTRODUODENOSCOPY (EGD);  Surgeon: Rogene Houston, MD;  [facility];  Service: Endoscopy;  Laterality: N/A;  . EUS N/A 10/07/2012   Procedure: UPPER ENDOSCOPIC ULTRASOUND (EUS) LINEAR;  Surgeon: Milus Banister, MD;  Location: [facility] ENDOS[facility];  Service: Endoscopy;  Laterality: N/A;  . INCISION AND DRAINAGE ABSCESS ANAL    . LAPAROSCOPIC GASTRIC BANDING  2006   has been removed  . PORT-A-CATH REMOVAL Left 12/07/2012   Procedure: REMOVAL  PORT-A-CATH;  Surgeon: Scherry Ran, MD;  Location: [facility] ORS;  Service: General;  Laterality: Left;  . PORTACATH PLACEMENT  07/07/2011   Procedure: INSERTION PORT-A-CATH;  Surgeon: Scherry Ran, MD;  Location: [facility] ORS;  Service: General;  Laterality: N/A;  . PORTACATH PLACEMENT N/A 12/07/2012   Procedure: INSERTION PORT-A-CATH;  Surgeon: Scherry Ran, MD;  Location: [facility] ORS;  Service: General;  Laterality: N/A;  Attempted portacath placement on left and right side  . WRIST SURGERY     Left; removal of bone fragment    There  were no vitals filed for this visit.       Subjective Assessment - 06/27/16 1729    Subjective Pt arrived with dressings intact, stated pain very minimal today maybe 1-2/10   Currently in Pain? Yes   Pain Score 2    Pain Location Leg   Pain Orientation Left;Distal;Anterior   Pain Descriptors / Indicators Discomfort   Pain Type Chronic pain                   Wound Therapy - 06/27/16 1729    Subjective Pt arrived with dressings intact, stated pain very minimal today maybe 1-2/10   Patient and Family Stated Goals wound to heal    Date of Onset --  several weeks ago   Prior Treatments silvadene from MD    Pain Assessment 0-10   Pain Onset On-going   Evaluation and Treatment Procedures Explained to Patient/Family Yes   Evaluation and Treatment Procedures agreed to   Wound Properties Date First Assessed: 06/25/16 Wound Type: Non-pressure wound Location: Leg Location Orientation: Left;Anterior;Lower Wound Description (Comments): L anterior shin, medial  Present on Admission: Yes   Dressing Type Impregnated gauze (bismuth)  xeroform, gauze and medipore tape with netting   Dressing Changed Changed   Dressing Status Clean;Dry   Dressing Change Frequency PRN   Site / Wound Assessment Clean;Dry   % Wound base Red or Granulating 100%   % Wound base Yellow/Fibrinous Exudate 0%   % Wound base Black/Eschar 0%   Peri-wound Assessment Edema   Drainage Amount None   Treatment Cleansed   Wound Properties Date First Assessed: 06/25/16 Wound Type: Non-pressure wound Location: Leg Location Orientation: Left;Anterior;Lower Wound Description (Comments): L anterior shin, lateral  Present on Admission: Yes   Selective Debridement - Location no debridement necessary, 100% granulation upon removal of dressings   Wound Therapy - Clinical Statement Pt arrived with dressings intact and compression hose on LE.  No debridement necessary this session, just cleansed and redressed.  Significant amount  of edema present Lt LE.  Pt educated on lymphedema treatments.  Following treatment spoke to evaluation DPT to discuss referral for lymphedema treatments.  Due to wound healing so fast PT wishes to hold on referral.  No signed referral received to being wound care to foot.  No reports of pain through session.   Wound Therapy - Functional Problem List leg pain and difficulty walking, non-healing wound    Factors Delaying/Impairing Wound Healing Diabetes Mellitus;Multiple medical problems;Immobility;Polypharmacy;Vascular compromise   Wound Therapy - Frequency --  2x/week for 4 weeks   Wound Therapy - Current Recommendations PT   Wound Plan debridemnet PRN, xeroform; may consider profore dressing or referral to begin lymphedema treatment if healing apperas to stall. Follow up on order for foot wound.    Dressing  xeroform, gauze, medipore tape and netting  PT Short Term Goals - 06/25/16 1754      PT SHORT TERM GOAL #1   Title Patient to demonstrate 100% granulation tissue with no need for debridement in order to show general improvement of condition    Time 2   Period Weeks   Status New     PT SHORT TERM GOAL #2   Title Patient to experience pain no more than 1/10 in wound area in order to show general improvement of condition    Time 2   Period Weeks   Status New           PT Long Term Goals - 06/25/16 1755      PT LONG TERM GOAL #1   Title Patient to demonstrate full wound healing in order to show resolution of condition    Time 4   Period Weeks   Status New     PT LONG TERM GOAL #2   Title Patient and family to be independent in skin care strategies and edema control in order to assist in prevention of other wound formation    Time 4   Period Weeks   Status New             Patient will benefit from skilled therapeutic intervention in order to improve the following deficits and impairments:     Visit Diagnosis: Non-pressure chronic  ulcer of calf, limited to breakdown of skin, left (HCC)  Pain in left lower leg  Difficulty in walking, not elsewhere classified     Problem List Patient Active Problem List   Diagnosis Date Noted  . Pneumonia 06/10/2016  . CAP (community acquired pneumonia) 06/10/2016  . Cellulitis of left lower extremity   . Hypogammaglobulinemia ([place]) 03/09/2016  . Hypokalemia 03/09/2016  . Diaphragmatic hernia without obstruction 02/26/2016  . Chronic systolic heart failure ([place]) 12/19/2013  . Anemia, normocytic normochromic 10/09/2012  . Chronic pancreatitis ([place]) 10/07/2012  . Atrial flutter ([place]) 09/17/2012  . DDD (degenerative disc disease), cervical 03/18/2012  . Arteriosclerotic cardiovascular disease (ASCVD)   . Hypertension   . Hyperlipidemia   . Multiple myeloma ([place]) 07/01/2011  . Morbid obesity ([place]) 04/29/2010  . Insulin dependent diabetes mellitus ([place]) 11/15/2008  . Sleep apnea 11/15/2008   Ihor Austin, LPTA; [place]  Aldona Lento 06/27/2016, 5:42 PM  [place], [place], [address] Phone: [phone]   Fax:  [phone]  Name: [name] MRN: [id number] Date of Birth: 06-Jun-1960

## 2016-06-30 ENCOUNTER — Telehealth (HOSPITAL_COMMUNITY): Payer: Self-pay | Admitting: Physical Therapy

## 2016-06-30 ENCOUNTER — Ambulatory Visit (HOSPITAL_COMMUNITY): Payer: BLUE CROSS/BLUE SHIELD | Admitting: Physical Therapy

## 2016-06-30 DIAGNOSIS — L97221 Non-pressure chronic ulcer of left calf limited to breakdown of skin: Secondary | ICD-10-CM

## 2016-06-30 DIAGNOSIS — M79662 Pain in left lower leg: Secondary | ICD-10-CM

## 2016-06-30 DIAGNOSIS — R262 Difficulty in walking, not elsewhere classified: Secondary | ICD-10-CM

## 2016-06-30 NOTE — Therapy (Signed)
[name], [place], [address] Phone: [phone]   Fax:  [phone]  Wound Care Therapy  Patient Details  Name: [name] MRN: [id number] Date of Birth: 1960/10/31 No Data Recorded  Encounter Date: 06/30/2016      PT End of Session - 06/30/16 1757    Visit Number 3   Number of Visits 9   Date for PT Re-Evaluation 07/23/16   Authorization Type BCBS PPO    Authorization Time Period 06/25/16 to 07/26/16   PT Start Time 1630  pt was late for appt   PT Stop Time 1710   PT Time Calculation (min) 40 min   Activity Tolerance Patient tolerated treatment well   Behavior During Therapy [facility] for tasks assessed/performed      Past Medical History:  Diagnosis Date  . Arteriosclerotic cardiovascular disease (ASCVD)    MI-2000s; stent to the proximal LAD and diagonal in 2001; stress nuclear in 2008-impaired exercise capacity, left ventricular dilatation, moderately to severely depressed EF, apical, inferior and anteroseptal scar  . Atrial flutter ([place])   . 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 Cellulitis of leg    both legs  . Chronic diarrhea   . Chronic kidney disease, stage 3, mod decreased GFR    Creatinine of 1.84 in 06/2011 and 1.5 in 07/2011  . Diabetes mellitus    Insulin  . GERD (gastroesophageal reflux disease)   . Gout   . Hyperlipidemia   . Hypertension   . Injection site reaction   . Multiple myeloma 07/01/2011  . Myocardial infarction 2000  . Obesity   . Pedal edema    Venous insufficiency  . Sleep apnea    uses cpap  . Ulcer [facility])     Past Surgical History:  Procedure Laterality Date  . ABSCESS DRAINAGE     Scrotal  . BIOPSY  01/02/2012   Procedure: BIOPSY;  Surgeon: Rogene Houston, MD;  [facility];  Service: Endoscopy;  Laterality: N/A;  . BONE MARROW BIOPSY  05/13/11  . CARDIAC CATHETERIZATION     cardiac stent  . CARDIOVERSION N/A 10/13/2012   Procedure: CARDIOVERSION;   Surgeon: Yehuda Savannah, MD;  Location: [facility] ORS;  Service: Cardiovascular;  Laterality: N/A;  . CATARACT EXTRACTION W/PHACO Left 02/13/2014   Procedure: CATARACT EXTRACTION PHACO AND INTRAOCULAR LENS PLACEMENT ([place]);  Surgeon: Tonny Branch, MD;  Location: [facility] ORS;  Service: Ophthalmology;  Laterality: Left;  CDE:  7.67  . CATARACT EXTRACTION W/PHACO Right 03/02/2014   Procedure: CATARACT EXTRACTION PHACO AND INTRAOCULAR LENS PLACEMENT RIGHT EYE CDE=16.81;  Surgeon: Tonny Branch, MD;  Location: [facility] ORS;  Service: Ophthalmology;  Laterality: Right;  . COLONOSCOPY  11/28/2011   Procedure: COLONOSCOPY;  Surgeon: Rogene Houston, MD;  Location[facility];  Service: Endoscopy;  Laterality: N/A;  930  . ESOPHAGOGASTRODUODENOSCOPY  01/02/2012   Procedure: ESOPHAGOGASTRODUODENOSCOPY (EGD);  Surgeon: Rogene Houston, MD;  [facility]: [facility];  Service: Endoscopy;  Laterality: N/A;  100  . ESOPHAGOGASTRODUODENOSCOPY N/A 09/20/2012   Procedure: ESOPHAGOGASTRODUODENOSCOPY (EGD);  Surgeon: Rogene Houston, MD;  Location: [facility];  Service: Endoscopy;  Laterality: N/A;  . EUS N/A 10/07/2012   Procedure: UPPER ENDOSCOPIC ULTRASOUND (EUS) LINEAR;  Surgeon: Milus Banister, MD;  [facility];  Service: Endoscopy;  Laterality: N/A;  . INCISION AND DRAINAGE ABSCESS ANAL    . LAPAROSCOPIC GASTRIC BANDING  2006   has been removed  . PORT-A-CATH REMOVAL  Left 12/07/2012   Procedure: REMOVAL PORT-A-CATH;  Surgeon: Scherry Ran, MD;  Location: [facility] ORS;  Service: General;  Laterality: Left;  . PORTACATH PLACEMENT  07/07/2011   Procedure: INSERTION PORT-A-CATH;  Surgeon: Scherry Ran, MD;  Location: [facility] ORS;  Service: General;  Laterality: N/A;  . PORTACATH PLACEMENT N/A 12/07/2012   Procedure: INSERTION PORT-A-CATH;  Surgeon: Scherry Ran, MD;  Location: [facility] ORS;  Service: General;  Laterality: N/A;  Attempted portacath placement on left and right side  . WRIST SURGERY     Left; removal of bone  fragment    There were no vitals filed for this visit.                  Wound Therapy - 06/30/16 1753    Subjective dressing intact with knee high compression stockings on.     Patient and Family Stated Goals wound to heal    Date of Onset --  several weeks ago   Prior Treatments silvadene from MD    Pain Assessment No/denies pain   Evaluation and Treatment Procedures Explained to Patient/Family Yes   Evaluation and Treatment Procedures agreed to   Wound Properties Date First Assessed: 06/25/16 Wound Type: Non-pressure wound Location: Leg Location Orientation: Left;Anterior;Lower Wound Description (Comments): L anterior shin, medial  Present on Admission: Yes   Dressing Type Impregnated gauze (bismuth)  xeroform, gauze and medipore tape with netting   Dressing Changed Changed   Dressing Status Clean;Dry   Dressing Change Frequency PRN   Site / Wound Assessment Clean;Dry   % Wound base Red or Granulating 100%   % Wound base Yellow/Fibrinous Exudate 0%   % Wound base Black/Eschar 0%   Peri-wound Assessment Edema   Drainage Amount None   Treatment Cleansed   Wound Properties Date First Assessed: 06/25/16 Wound Type: Non-pressure wound Location: Leg Location Orientation: Left;Anterior;Lower Wound Description (Comments): L anterior shin, lateral  Present on Admission: Yes   Selective Debridement - Location no debridement necessary, 100% granulation upon removal of dressings   Wound Therapy - Clinical Statement Dressings intact, however wound is where compression stocking stops.  Educated pateint on lymphedema and compression garments.  Lt LE is only slightly larger than Rt, however with induration.   Unsure as to how old his garments are due to having several pair he alternates around.  Faxed order to MD for new garments, however would benefit from thigh highs (induration and swelling felt in lt posterior thigh).  Pt would also beneift from intermittent sequential compression  for bilateral LE's and nighttime garments.  Orders faxed.    Wound Therapy - Functional Problem List leg pain and difficulty walking, non-healing wound    Factors Delaying/Impairing Wound Healing Diabetes Mellitus;Multiple medical problems;Immobility;Polypharmacy;Vascular compromise   Wound Therapy - Frequency --  2x/week for 4 weeks   Wound Therapy - Current Recommendations PT   Wound Plan debridemnet PRN.  Follow up with referrals for new thigh high hose, compression pump and reidsleeves.   Dressing  xeroform, gauze, medipore tape and netting                 PT Education - 06/30/16 1758    Education provided Yes   Education Details lymphedema and proper care for diagnosis.     Person(s) Educated Patient   Methods Explanation          PT Short Term Goals - 06/25/16 1754      PT SHORT TERM GOAL #1   Title Patient  to demonstrate 100% granulation tissue with no need for debridement in order to show general improvement of condition    Time 2   Period Weeks   Status New     PT SHORT TERM GOAL #2   Title Patient to experience pain no more than 1/10 in wound area in order to show general improvement of condition    Time 2   Period Weeks   Status New           PT Long Term Goals - 06/25/16 1755      PT LONG TERM GOAL #1   Title Patient to demonstrate full wound healing in order to show resolution of condition    Time 4   Period Weeks   Status New     PT LONG TERM GOAL #2   Title Patient and family to be independent in skin care strategies and edema control in order to assist in prevention of other wound formation    Time 4   Period Weeks   Status New             Patient will benefit from skilled therapeutic intervention in order to improve the following deficits and impairments:     Visit Diagnosis: Non-pressure chronic ulcer of calf, limited to breakdown of skin, left (HCC)  Pain in left lower leg  Difficulty in walking, not elsewhere  classified     Problem List Patient Active Problem List   Diagnosis Date Noted  . Pneumonia 06/10/2016  . CAP (community acquired pneumonia) 06/10/2016  . Cellulitis of left lower extremity   . Hypogammaglobulinemia ([place]) 03/09/2016  . Hypokalemia 03/09/2016  . Diaphragmatic hernia without obstruction 02/26/2016  . Chronic systolic heart failure ([place]) 12/19/2013  . Anemia, normocytic normochromic 10/09/2012  . Chronic pancreatitis ([place]) 10/07/2012  . Atrial flutter ([place]) 09/17/2012  . DDD (degenerative disc disease), cervical 03/18/2012  . Arteriosclerotic cardiovascular disease (ASCVD)   . Hypertension   . Hyperlipidemia   . Multiple myeloma ([place]) 07/01/2011  . Morbid obesity ([place]) 04/29/2010  . Insulin dependent diabetes mellitus ([place]) 11/15/2008  . Sleep apnea 11/15/2008    Teena Irani, PTA/CLT [phone]  06/30/2016, 5:59 PM  [place] [address] [place], [place], [address] Phone: [phone]   Fax:  [phone]  Name: THAI BURGUENO MRN: [id number] Date of Birth: 1960-10-17

## 2016-06-30 NOTE — Telephone Encounter (Signed)
Order faxed to Dr. [name] for thigh high compression stocking  (30-66mmHg) and reidsleeve for night time.   Order also faxed to [facility] for intermittent sequential compression for bilateral LE's. Teena Irani, PTA/CLT [phone]

## 2016-07-03 ENCOUNTER — Ambulatory Visit (HOSPITAL_COMMUNITY): Payer: BLUE CROSS/BLUE SHIELD | Attending: Internal Medicine | Admitting: Physical Therapy

## 2016-07-03 DIAGNOSIS — R262 Difficulty in walking, not elsewhere classified: Secondary | ICD-10-CM | POA: Insufficient documentation

## 2016-07-03 DIAGNOSIS — M79662 Pain in left lower leg: Secondary | ICD-10-CM | POA: Insufficient documentation

## 2016-07-03 DIAGNOSIS — L97221 Non-pressure chronic ulcer of left calf limited to breakdown of skin: Secondary | ICD-10-CM | POA: Diagnosis not present

## 2016-07-03 NOTE — Therapy (Signed)
[place], [place], [address] Phone: [phone]   Fax:  [phone]  Wound Care Therapy (Discharge)   Patient Details  Name: [name] MRN: [id number] Date of Birth: [birth year] No Data Recorded  Encounter Date: 07/03/2016      PT End of Session - 07/03/16 1743    Visit Number 4   Number of Visits 9   Date for PT Re-Evaluation 07/23/16   Authorization Type [employer] PPO    Authorization Time Period 06/25/16 to 07/26/16   PT Start Time 1700   PT Stop Time 1725   PT Time Calculation (min) 25 min   Activity Tolerance Patient tolerated treatment well   Behavior During Therapy [facility] for tasks assessed/performed      Past Medical History:  Diagnosis Date  . Arteriosclerotic cardiovascular disease (ASCVD)    MI-2000s; stent to the proximal LAD and diagonal in 2001; stress nuclear in 2008-impaired exercise capacity, left ventricular dilatation, moderately to severely depressed EF, apical, inferior and anteroseptal scar  . Atrial flutter ([place])   . 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 Cellulitis of leg    both legs  . Chronic diarrhea   . Chronic kidney disease, stage 3, mod decreased GFR    Creatinine of 1.84 in 06/2011 and 1.5 in 07/2011  . Diabetes mellitus    Insulin  . GERD (gastroesophageal reflux disease)   . Gout   . Hyperlipidemia   . Hypertension   . Injection site reaction   . Multiple myeloma 07/01/2011  . Myocardial infarction 2000  . Obesity   . Pedal edema    Venous insufficiency  . Sleep apnea    uses cpap  . Ulcer [facility])     Past Surgical History:  Procedure Laterality Date  . ABSCESS DRAINAGE     Scrotal  . BIOPSY  01/02/2012   Procedure: BIOPSY;  Surgeon: Rogene Houston, MD;  Location: [facility];  Service: Endoscopy;  Laterality: N/A;  . BONE MARROW BIOPSY  05/13/11  . CARDIAC CATHETERIZATION     cardiac stent  . CARDIOVERSION N/A 10/13/2012   Procedure: CARDIOVERSION;  Surgeon:  Yehuda Savannah, MD;  Location: [facility] ORS;  Service: Cardiovascular;  Laterality: N/A;  . CATARACT EXTRACTION W/PHACO Left 02/13/2014   Procedure: CATARACT EXTRACTION PHACO AND INTRAOCULAR LENS PLACEMENT ([place]);  Surgeon: Tonny Branch, MD;  Location: [facility] ORS;  Service: Ophthalmology;  Laterality: Left;  CDE:  7.67  . CATARACT EXTRACTION W/PHACO Right 03/02/2014   Procedure: CATARACT EXTRACTION PHACO AND INTRAOCULAR LENS PLACEMENT RIGHT EYE CDE=16.81;  Surgeon: Tonny Branch, MD;  Location: [facility] ORS;  Service: Ophthalmology;  Laterality: Right;  . COLONOSCOPY  11/28/2011   Procedure: COLONOSCOPY;  Surgeon: Rogene Houston, MD;  Location: [facility];  Service: Endoscopy;  Laterality: N/A;  930  . ESOPHAGOGASTRODUODENOSCOPY  01/02/2012   Procedure: ESOPHAGOGASTRODUODENOSCOPY (EGD);  Surgeon: Rogene Houston, MD;  [facility];  Service: Endoscopy;  Laterality: N/A;  100  . ESOPHAGOGASTRODUODENOSCOPY N/A 09/20/2012   Procedure: ESOPHAGOGASTRODUODENOSCOPY (EGD);  Surgeon: Rogene Houston, MD;  Location[facility];  Service: Endoscopy;  Laterality: N/A;  . EUS N/A 10/07/2012   Procedure: UPPER ENDOSCOPIC ULTRASOUND (EUS) LINEAR;  Surgeon: Milus Banister, MD;  Location: [facility] ENDOS[facility];  Service: Endoscopy;  Laterality: N/A;  . INCISION AND DRAINAGE ABSCESS ANAL    . LAPAROSCOPIC GASTRIC BANDING  2006   has been removed  . PORT-A-CATH REMOVAL Left 12/07/2012  Procedure: REMOVAL PORT-A-CATH;  Surgeon: Scherry Ran, MD;  Location: [facility] ORS;  Service: General;  Laterality: Left;  . PORTACATH PLACEMENT  07/07/2011   Procedure: INSERTION PORT-A-CATH;  Surgeon: Scherry Ran, MD;  Location: [facility] ORS;  Service: General;  Laterality: N/A;  . PORTACATH PLACEMENT N/A 12/07/2012   Procedure: INSERTION PORT-A-CATH;  Surgeon: Scherry Ran, MD;  Location: [facility] ORS;  Service: General;  Laterality: N/A;  Attempted portacath placement on left and right side  . WRIST SURGERY     Left; removal of bone fragment     There were no vitals filed for this visit.                  Wound Therapy - 07/03/16 1727    Subjective Dressing intact; no pain; knee high compression stockings in place.   Patient and Family Stated Goals wound to heal    Date of Onset --  several weeks ago   Prior Treatments silvadene from MD    Pain Assessment No/denies pain   Evaluation and Treatment Procedures Explained to Patient/Family Yes   Evaluation and Treatment Procedures agreed to   Wound Properties Date First Assessed: 06/25/16 Wound Type: Non-pressure wound Location: Leg Location Orientation: Left;Anterior;Lower Wound Description (Comments): L anterior shin, medial  Present on Admission: Yes   Dressing Type Impregnated gauze (bismuth)  xeroform, gauze and medipore tape with netting   Dressing Changed Other (Comment)   Dressing Status Clean;Dry   Dressing Change Frequency PRN   Site / Wound Assessment Clean;Dry   % Wound base Red or Granulating 100%   % Wound base Yellow/Fibrinous Exudate 0%   % Wound base Black/Eschar 0%   Peri-wound Assessment Edema   Drainage Amount None   Treatment Cleansed   Wound Properties Date First Assessed: 06/25/16 Wound Type: Non-pressure wound Location: Leg Location Orientation: Left;Anterior;Lower Wound Description (Comments): L anterior shin, lateral  Present on Admission: Yes   Selective Debridement - Location no debridement necessary, 100% granulation upon removal of dressings   Wound Therapy - Clinical Statement wound is now healed with no further skilled woundcare needed.  Informed patient to continue with compression garments and instructed to get a new order if MD decided he wanted skilled woundcare for his Lt foot.  Pt verbalized understanding.    Wound Therapy - Functional Problem List leg pain and difficulty walking, non-healing wound    Factors Delaying/Impairing Wound Healing Diabetes Mellitus;Multiple medical problems;Immobility;Polypharmacy;Vascular compromise    Wound Therapy - Frequency --  2x/week for 4 weeks   Wound Therapy - Current Recommendations PT   Wound Plan Wound is now healed without futher skilled intervention needed.   Dressing  none                 PT Education - 07/03/16 1731    Education provided Yes   Education Details continue with compression garments at day and moisturizing at night.     Person(s) Educated Patient   Methods Explanation   Comprehension Verbalized understanding          PT Short Term Goals - 07/03/16 1730      PT SHORT TERM GOAL #1   Title Patient to demonstrate 100% granulation tissue with no need for debridement in order to show general improvement of condition    Time 2   Period Weeks   Status Achieved     PT SHORT TERM GOAL #2   Title Patient to experience pain no more than 1/10 in wound  area in order to show general improvement of condition    Time 2   Period Weeks   Status Achieved           PT Long Term Goals - 07/03/16 1730      PT LONG TERM GOAL #1   Title Patient to demonstrate full wound healing in order to show resolution of condition    Time 4   Period Weeks   Status Achieved     PT LONG TERM GOAL #2   Title Patient and family to be independent in skin care strategies and edema control in order to assist in prevention of other wound formation    Time 4   Period Weeks   Status Achieved             Patient will benefit from skilled therapeutic intervention in order to improve the following deficits and impairments:     Visit Diagnosis: Non-pressure chronic ulcer of calf, limited to breakdown of skin, left (HCC)  Pain in left lower leg  Difficulty in walking, not elsewhere classified     Problem List Patient Active Problem List   Diagnosis Date Noted  . Pneumonia 06/10/2016  . CAP (community acquired pneumonia) 06/10/2016  . Cellulitis of left lower extremity   . Hypogammaglobulinemia ([place]) 03/09/2016  . Hypokalemia 03/09/2016  .  Diaphragmatic hernia without obstruction 02/26/2016  . Chronic systolic heart failure ([place]) 12/19/2013  . Anemia, normocytic normochromic 10/09/2012  . Chronic pancreatitis ([place]) 10/07/2012  . Atrial flutter ([place]) 09/17/2012  . DDD (degenerative disc disease), cervical 03/18/2012  . Arteriosclerotic cardiovascular disease (ASCVD)   . Hypertension   . Hyperlipidemia   . Multiple myeloma ([place]) 07/01/2011  . Morbid obesity ([place]) 04/29/2010  . Insulin dependent diabetes mellitus ([place]) 11/15/2008  . Sleep apnea 11/15/2008    Teena Irani, PTA/CLT [phone]  07/03/2016, 5:43 PM   PHYSICAL THERAPY DISCHARGE SUMMARY  Visits from Start of Care: 4   Current functional level related to goals / functional outcomes: Wound is fully resolved at this point; per PTA, patient has stated that his MD is currently debriding/dressing his foot wound however we have educated that we can take this over with new MD order if patient/his MD would like [place] to. We have also placed orders for updated lymphedema equipment for patient and will call him when they are signed by MD, patient is competent in this equipment and will likely not need lymphedema PT as of now. DC today due to wound resolution.    Remaining deficits: Wound has been resolved    Education / Equipment: DC today, will need new wound referral for foot wound; we will call him when lymph equipment orders come in  Plan: Patient agrees to discharge.  Patient goals were met. Patient is being discharged due to meeting the stated rehab goals.  ?????     Deniece Ree PT, DPT [place] [address] [place], [place], [address] Phone: [phone]   Fax:  [phone]  Name: [name] MRN: [id number] Date of Birth: 1961-03-17

## 2016-07-07 ENCOUNTER — Other Ambulatory Visit (HOSPITAL_COMMUNITY): Payer: Self-pay | Admitting: Oncology

## 2016-07-07 ENCOUNTER — Ambulatory Visit (HOSPITAL_COMMUNITY): Payer: BLUE CROSS/BLUE SHIELD | Admitting: Physical Therapy

## 2016-07-07 ENCOUNTER — Other Ambulatory Visit (INDEPENDENT_AMBULATORY_CARE_PROVIDER_SITE_OTHER): Payer: Self-pay | Admitting: Internal Medicine

## 2016-07-07 DIAGNOSIS — C9001 Multiple myeloma in remission: Secondary | ICD-10-CM

## 2016-07-07 MED ORDER — LENALIDOMIDE 5 MG PO CAPS: ORAL_CAPSULE | ORAL | 0 refills | Status: DC

## 2016-07-08 ENCOUNTER — Telehealth (INDEPENDENT_AMBULATORY_CARE_PROVIDER_SITE_OTHER): Payer: Self-pay | Admitting: Internal Medicine

## 2016-07-08 ENCOUNTER — Other Ambulatory Visit (INDEPENDENT_AMBULATORY_CARE_PROVIDER_SITE_OTHER): Payer: Self-pay | Admitting: Internal Medicine

## 2016-07-08 MED ORDER — PANCRELIPASE (LIP-PROT-AMYL) 36000-114000 UNITS PO CPEP: ORAL_CAPSULE | ORAL | 11 refills | Status: DC

## 2016-07-08 MED ORDER — DEXLANSOPRAZOLE 60 MG PO CPDR: 60.0000 mg | DELAYED_RELEASE_CAPSULE | Freq: Every day | ORAL | 11 refills | Status: DC

## 2016-07-08 NOTE — Telephone Encounter (Signed)
A pa was worked on last week for [employer] , have not heard form this. Creon was denied and stated that the patient would need to try Zenpep. Dr.Rehman is doing a Rx for that medication.

## 2016-07-08 NOTE — Telephone Encounter (Signed)
Patient called, stated that [name] was trying to get approval from [place] to fil Dexilant and Creon.    [phone]

## 2016-07-08 NOTE — Telephone Encounter (Signed)
I'm sorry Dr. Laural Golden, I meant to send that to [facility].

## 2016-07-10 ENCOUNTER — Ambulatory Visit (HOSPITAL_COMMUNITY): Payer: BLUE CROSS/BLUE SHIELD

## 2016-07-10 ENCOUNTER — Telehealth (HOSPITAL_COMMUNITY): Payer: Self-pay | Admitting: Physical Therapy

## 2016-07-10 NOTE — Telephone Encounter (Signed)
Patient is aware of the script for Lymph garment and will come by to pick it up from the front desk.

## 2016-07-10 NOTE — Telephone Encounter (Signed)
Orders received for reidsleeve and new compression garments.  Faxed order and demo sheet for reidsleeve and contacted patient to come by and get his order for his compression garments Teena Irani, PTA/CLT [phone]

## 2016-07-14 ENCOUNTER — Ambulatory Visit (HOSPITAL_COMMUNITY): Payer: BLUE CROSS/BLUE SHIELD | Admitting: Physical Therapy

## 2016-07-16 ENCOUNTER — Telehealth (INDEPENDENT_AMBULATORY_CARE_PROVIDER_SITE_OTHER): Payer: Self-pay | Admitting: Internal Medicine

## 2016-07-16 NOTE — Telephone Encounter (Signed)
Patient was called and made aware that the Creon , insurance will not cover. They recommend the Zen pep. Dexilant 60 mg , the are denying.  Per Dr.Rehman may give the patient Zen pep 40,000 units - take 3 with each meal , and 1 with each snack. May give samples of the Dexilant to the patient from the office.  The Rx was called to [facility]. Patient was called and made aware.

## 2016-07-16 NOTE — Telephone Encounter (Signed)
Patient called, he is wanting Creon samples.  Stated that he needs them ASAP.  He also stated that he is still waiting for his insurance to approve it.  [phone] or [phone]

## 2016-07-17 ENCOUNTER — Ambulatory Visit (HOSPITAL_COMMUNITY): Payer: BLUE CROSS/BLUE SHIELD

## 2016-07-21 ENCOUNTER — Ambulatory Visit (HOSPITAL_COMMUNITY): Payer: BLUE CROSS/BLUE SHIELD | Admitting: Physical Therapy

## 2016-07-24 ENCOUNTER — Ambulatory Visit (HOSPITAL_COMMUNITY): Payer: BLUE CROSS/BLUE SHIELD | Admitting: Physical Therapy

## 2016-07-26 ENCOUNTER — Other Ambulatory Visit: Payer: Self-pay | Admitting: Cardiovascular Disease

## 2016-07-26 ENCOUNTER — Other Ambulatory Visit (HOSPITAL_COMMUNITY): Payer: Self-pay | Admitting: Oncology

## 2016-07-28 ENCOUNTER — Ambulatory Visit (HOSPITAL_COMMUNITY): Payer: BLUE CROSS/BLUE SHIELD | Admitting: Physical Therapy

## 2016-07-28 ENCOUNTER — Telehealth (INDEPENDENT_AMBULATORY_CARE_PROVIDER_SITE_OTHER): Payer: Self-pay | Admitting: Internal Medicine

## 2016-07-28 NOTE — Telephone Encounter (Signed)
Patient called, wants to know if his script for Dexilant went through.  He also stated that he needs Torsemide that his feet and legs are swollen really bad.  He requested samples of both Dexilant and Torsemide.  [phone]

## 2016-07-31 NOTE — Telephone Encounter (Signed)
Samples of Dexilant 60 mg given to the patient. Still working on [employer] for [employer]. Torsemide patient should see PCP for this prescription. Patient was called and a message was left on his voice mail.

## 2016-08-07 ENCOUNTER — Other Ambulatory Visit (HOSPITAL_COMMUNITY): Payer: Self-pay | Admitting: Oncology

## 2016-08-07 ENCOUNTER — Other Ambulatory Visit (HOSPITAL_COMMUNITY): Payer: Self-pay | Admitting: Emergency Medicine

## 2016-08-07 DIAGNOSIS — C9001 Multiple myeloma in remission: Secondary | ICD-10-CM

## 2016-08-07 MED ORDER — LENALIDOMIDE 5 MG PO CAPS: ORAL_CAPSULE | ORAL | 0 refills | Status: DC

## 2016-08-07 NOTE — Progress Notes (Signed)
revilimid refilled

## 2016-08-19 ENCOUNTER — Encounter (HOSPITAL_COMMUNITY): Payer: BLUE CROSS/BLUE SHIELD

## 2016-08-19 ENCOUNTER — Other Ambulatory Visit (HOSPITAL_COMMUNITY): Payer: Self-pay | Admitting: Oncology

## 2016-08-19 ENCOUNTER — Encounter (HOSPITAL_COMMUNITY): Payer: BLUE CROSS/BLUE SHIELD | Attending: Adult Health | Admitting: Adult Health

## 2016-08-19 ENCOUNTER — Encounter (HOSPITAL_COMMUNITY): Payer: Self-pay | Admitting: Adult Health

## 2016-08-19 VITALS — BP 126/89 | HR 60 | Temp 98.7°F | Resp 18 | Ht 75.0 in | Wt 366.0 lb

## 2016-08-19 DIAGNOSIS — D801 Nonfamilial hypogammaglobulinemia: Secondary | ICD-10-CM | POA: Diagnosis not present

## 2016-08-19 DIAGNOSIS — K1379 Other lesions of oral mucosa: Secondary | ICD-10-CM

## 2016-08-19 DIAGNOSIS — C9001 Multiple myeloma in remission: Secondary | ICD-10-CM

## 2016-08-19 DIAGNOSIS — N183 Chronic kidney disease, stage 3 (moderate): Secondary | ICD-10-CM | POA: Diagnosis not present

## 2016-08-19 DIAGNOSIS — K6289 Other specified diseases of anus and rectum: Secondary | ICD-10-CM

## 2016-08-19 LAB — CBC WITH DIFFERENTIAL/PLATELET
Basophils Absolute: 0 10*3/uL (ref 0.0–0.1)
Basophils Relative: 0 %
Eosinophils Absolute: 0.3 10*3/uL (ref 0.0–0.7)
Eosinophils Relative: 4 %
HCT: 31.7 % — ABNORMAL LOW (ref 39.0–52.0)
Hemoglobin: 10.5 g/dL — ABNORMAL LOW (ref 13.0–17.0)
Lymphocytes Relative: 29 %
Lymphs Abs: 2.1 10*3/uL (ref 0.7–4.0)
MCH: 30.9 pg (ref 26.0–34.0)
MCHC: 33.1 g/dL (ref 30.0–36.0)
MCV: 93.2 fL (ref 78.0–100.0)
Monocytes Absolute: 0.7 10*3/uL (ref 0.1–1.0)
Monocytes Relative: 9 %
Neutro Abs: 4.3 10*3/uL (ref 1.7–7.7)
Neutrophils Relative %: 58 %
Platelets: 145 10*3/uL — ABNORMAL LOW (ref 150–400)
RBC: 3.4 MIL/uL — ABNORMAL LOW (ref 4.22–5.81)
RDW: 16.4 % — ABNORMAL HIGH (ref 11.5–15.5)
WBC: 7.4 10*3/uL (ref 4.0–10.5)

## 2016-08-19 LAB — COMPREHENSIVE METABOLIC PANEL
ALT: 45 U/L (ref 17–63)
AST: 29 U/L (ref 15–41)
Albumin: 3.3 g/dL — ABNORMAL LOW (ref 3.5–5.0)
Alkaline Phosphatase: 96 U/L (ref 38–126)
Anion gap: 8 (ref 5–15)
BUN: 36 mg/dL — ABNORMAL HIGH (ref 6–20)
CO2: 21 mmol/L — ABNORMAL LOW (ref 22–32)
Calcium: 8.2 mg/dL — ABNORMAL LOW (ref 8.9–10.3)
Chloride: 110 mmol/L (ref 101–111)
Creatinine, Ser: 1.7 mg/dL — ABNORMAL HIGH (ref 0.61–1.24)
GFR calc Af Amer: 50 mL/min — ABNORMAL LOW (ref >60)
GFR calc non Af Amer: 43 mL/min — ABNORMAL LOW (ref >60)
Glucose, Bld: 76 mg/dL (ref 65–99)
Potassium: 3.4 mmol/L — ABNORMAL LOW (ref 3.5–5.1)
Sodium: 139 mmol/L (ref 135–145)
Total Bilirubin: 0.8 mg/dL (ref 0.3–1.2)
Total Protein: 6 g/dL — ABNORMAL LOW (ref 6.5–8.1)

## 2016-08-19 MED ORDER — HYDROCODONE-ACETAMINOPHEN 5-325 MG PO TABS: 1.0000 | ORAL_TABLET | ORAL | 0 refills | Status: DC | PRN

## 2016-08-19 NOTE — Patient Instructions (Signed)
[place] at [facility] Discharge Instructions  RECOMMENDATIONS MADE BY THE CONSULTANT AND ANY TEST RESULTS WILL BE SENT TO YOUR REFERRING PHYSICIAN.  You were seen today by Mike Craze, NP Lab work today Follow up in 2 months with labs See Amy up front for appointments    Thank you for choosing [place] at [facility] to provide your oncology and hematology care.  To afford each patient quality time with our provider, please arrive at least 15 minutes before your scheduled appointment time.    If you have a lab appointment with the [facility] please come in thru the  Main Entrance and check in at the main information desk  You need to re-schedule your appointment should you arrive 10 or more minutes late.  We strive to give you quality time with our providers, and arriving late affects you and other patients whose appointments are after yours.  Also, if you no show three or more times for appointments you may be dismissed from the clinic at the providers discretion.     Again, thank you for choosing [facility].  Our hope is that these requests will decrease the amount of time that you wait before being seen by our physicians.       _____________________________________________________________  Should you have questions after your visit to [facility], please contact our office at [phone] between the hours of 8:30 a.m. and 4:30 p.m.  Voicemails left after 4:30 p.m. will not be returned until the following business day.  For prescription refill requests, have your pharmacy contact our office.       Resources For Cancer Patients and their Caregivers ? American Cancer Society: Can assist with transportation, wigs, general needs, runs Look Good Feel Better.        (629)642-1730 ? Cancer Care: Provides financial assistance, online support groups, medication/co-pay assistance.  1-800-813-HOPE  718 729 2415) ? [facility] Assists [place] cancer patients and their families through emotional , educational and financial support.  931-553-9936 ? [place] DSS Where to apply for food stamps, Medicaid and utility assistance. 361-273-4363 ? [facility]: Transportation to medical appointments. 623-680-7883 ? Social Security Administration: May apply for disability if have a Stage IV cancer. (337)687-2005 (515)227-8429 ? [employer], Disability and Transit Services: Assists with nutrition, care and transit needs. [place] Support Programs: @10RELATIVEDAYS @ > Cancer Support Group  2nd Tuesday of the month 1pm-2pm, Journey Room  > Creative Journey  3rd Tuesday of the month 1130am-1pm, Journey Room  > Look Good Feel Better  1st Wednesday of the month 10am-12 noon, Journey Room (Call [place] to register [phone])

## 2016-08-19 NOTE — Progress Notes (Signed)
[place], [place] [address]   CLINIC:  Medical Oncology/Hematology  PCP:  Franklin Font, MD [address] [place] [address] [phone]   REASON FOR VISIT:  Follow-up for Stage II kappa multiple myeloma, in remission  CURRENT THERAPY: [name] 5 mg 7 days on, 7 days off with Decadron 20 mg BID every Friday only.    BRIEF ONCOLOGIC HISTORY:  (From Dr. Donald [place] last note on 03/06/16)     INTERVAL HISTORY:  [name] 56 y.o. male returns for follow-up for multiple myeloma. He remains on [name] 5 mg with 7 days on and 7 days off scheduling.  He takes Decadron 20 mg BID on Fridays only.    Since his last visit to the cancer center, he was hospitalized from 06/10/16-06/12/16 for sepsis d/t combination community-acquired pneumonia for influenza and UTI.    He has chronic stasis ulcers to bilateral legs/feet; today he is wearing bilateral diabetic shoes.  His wounds is managed by wound center. He notes that his diabetes contributes to these wounds. Reports that on Fridays when he takes the Decadron, his blood sugars are in the 300s. They slowly come down over the next week after steroids.    He also has a chronic rectal wound, reportedly from a surgery several years ago. Periodically, this wound will open and drain, which he attributes to his frequent/chronic diarrhea.  He sees [name] with GI, as well as his PCP, [name] for management of this.  He is requesting a refill of the Hydrocodone (after review of his chart and the [name] Controlled Substance Registry, he has not received an opiate prescription from our office in nearly 2 years).    Reports right hip pain, which has been chronic and is largely unchanged. He ambulates with a cane.    REVIEW OF SYSTEMS:  Review of Systems  Constitutional: Negative.  Negative for appetite change, chills and fever.  HENT:  Negative.   Eyes: Negative.   Respiratory: Negative.  Negative for  cough.   Cardiovascular: Negative.  Negative for chest pain.  Gastrointestinal: Negative.  Negative for abdominal pain, blood in stool, constipation, diarrhea, nausea and vomiting.  Endocrine: Negative.   Genitourinary: Negative.  Negative for dysuria and hematuria.   Musculoskeletal: Positive for arthralgias (R) hip pain.  Skin: Positive for wound (BLE/feet wounds).  Neurological: Negative.   Hematological: Negative.   Psychiatric/Behavioral: Negative.      PAST MEDICAL/SURGICAL HISTORY:  Past Medical History:  Diagnosis Date  . Arteriosclerotic cardiovascular disease (ASCVD)    MI-2000s; stent to the proximal LAD and diagonal in 2001; stress nuclear in 2008-impaired exercise capacity, left ventricular dilatation, moderately to severely depressed EF, apical, inferior and anteroseptal scar  . Atrial flutter ([place])   . 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 Cellulitis of leg    both legs  . Chronic diarrhea   . Chronic kidney disease, stage 3, mod decreased GFR    Creatinine of 1.84 in 06/2011 and 1.5 in 07/2011  . Diabetes mellitus    Insulin  . GERD (gastroesophageal reflux disease)   . Gout   . Hyperlipidemia   . Hypertension   . Injection site reaction   . Multiple myeloma 07/01/2011  . Myocardial infarction 2000  . Obesity   . Pedal edema    Venous insufficiency  . Sleep apnea    uses cpap  . Ulcer [facility])    Past Surgical History:  Procedure Laterality Date  .  ABSCESS DRAINAGE     Scrotal  . BIOPSY  01/02/2012   Procedure: BIOPSY;  Surgeon: Rogene Houston, MD;  [facility]: [facility];  Service: Endoscopy;  Laterality: N/A;  . BONE MARROW BIOPSY  05/13/11  . CARDIAC CATHETERIZATION     cardiac stent  . CARDIOVERSION N/A 10/13/2012   Procedure: CARDIOVERSION;  Surgeon: Yehuda Savannah, MD;  Location: [facility];  Service: Cardiovascular;  Laterality: N/A;  . CATARACT EXTRACTION W/PHACO Left 02/13/2014   Procedure: CATARACT EXTRACTION PHACO AND INTRAOCULAR LENS PLACEMENT  ([place]);  Surgeon: Tonny Branch, MD;  Location: [facility];  Service: Ophthalmology;  Laterality: Left;  CDE:  7.67  . CATARACT EXTRACTION W/PHACO Right 03/02/2014   Procedure: CATARACT EXTRACTION PHACO AND INTRAOCULAR LENS PLACEMENT RIGHT EYE CDE=16.81;  Surgeon: Tonny Branch, MD;  Location: [facility];  Service: Ophthalmology;  Laterality: Right;  . COLONOSCOPY  11/28/2011   Procedure: COLONOSCOPY;  Surgeon: Rogene Houston, MD;  [facility];  Service: Endoscopy;  Laterality: N/A;  930  . ESOPHAGOGASTRODUODENOSCOPY  01/02/2012   Procedure: ESOPHAGOGASTRODUODENOSCOPY (EGD);  Surgeon: Rogene Houston, MD;  [facility];  Service: Endoscopy;  Laterality: N/A;  100  . ESOPHAGOGASTRODUODENOSCOPY N/A 09/20/2012   Procedure: ESOPHAGOGASTRODUODENOSCOPY (EGD);  Surgeon: Rogene Houston, MD;  [facility];  Service: Endoscopy;  Laterality: N/A;  . EUS N/A 10/07/2012   Procedure: UPPER ENDOSCOPIC ULTRASOUND (EUS) LINEAR;  Surgeon: Milus Banister, MD;  Location: [facility];  Service: Endoscopy;  Laterality: N/A;  . INCISION AND DRAINAGE ABSCESS ANAL    . LAPAROSCOPIC GASTRIC BANDING  2006   has been removed  . PORT-A-CATH REMOVAL Left 12/07/2012   Procedure: REMOVAL PORT-A-CATH;  Surgeon: Scherry Ran, MD;  Location: [facility];  Service: General;  Laterality: Left;  . PORTACATH PLACEMENT  07/07/2011   Procedure: INSERTION PORT-A-CATH;  Surgeon: Scherry Ran, MD;  Location: [facility];  Service: General;  Laterality: N/A;  . PORTACATH PLACEMENT N/A 12/07/2012   Procedure: INSERTION PORT-A-CATH;  Surgeon: Scherry Ran, MD;  Location: [facility];  Service: General;  Laterality: N/A;  Attempted portacath placement on left and right side  . WRIST SURGERY     Left; removal of bone fragment     SOCIAL HISTORY:  Social History   Social History  . Marital status: Married    Spouse name: N/A  . Number of children: N/A  . Years of education: 53   Occupational History  . Theatre stage manager   . [employer]   Social History Main Topics  . Smoking status: Former Smoker    Packs/day: 0.25    Years: 1.00    Types: Cigarettes, Cigars    Quit date: 05/17/2001  . Smokeless tobacco: Never Used  . Alcohol use No  . Drug use: No  . Sexual activity: Yes    Birth control/ protection: None   Other Topics Concern  . Not on file   Social History Narrative  . No narrative on file    FAMILY HISTORY:  Family History  Problem Relation Age of Onset  . Heart disease Mother   . Cancer Mother   . Diabetes Father   . Arthritis    . Anesthesia problems Neg Hx   . Hypotension Neg Hx   . Malignant hyperthermia Neg Hx   . Pseudochol deficiency Neg Hx     CURRENT MEDICATIONS:  Outpatient Encounter Prescriptions as of 08/19/2016  Medication Sig  .  acyclovir (ZOVIRAX) 400 MG tablet TAKE ONE TABLET BY MOUTH EVERY MORNING.  Marland Kitchen allopurinol (ZYLOPRIM) 300 MG tablet TAKE ONE TABLET BY MOUTH ONCE DAILY.  Marland Kitchen atorvastatin (LIPITOR) 80 MG tablet TAKE ONE TABLET BY MOUTH AT BEDTIME.  . calcitRIOL (ROCALTROL) 0.25 MCG capsule Take 0.25 mcg by mouth 3 (three) times a week. Monday, Wednesday, Friday.  . calcium carbonate (OS-CAL) 600 MG TABS tablet Take 1,800 mg by mouth 2 (two) times daily.   . cefpodoxime (VANTIN) 200 MG tablet Take 1 tablet (200 mg total) by mouth 2 (two) times daily.  . cholestyramine (QUESTRAN) 4 g packet Take 1 packet (4 g total) by mouth 2 (two) times daily.  Marland Kitchen dexamethasone (DECADRON) 4 MG tablet TAKE 5 TABLETS BY MOUTH 2 TIMES A DAY EVERY FRIDAY.  Marland Kitchen dexlansoprazole (DEXILANT) 60 MG capsule Take 1 capsule (60 mg total) by mouth daily.  . diphenoxylate-atropine (LOMOTIL) 2.5-0.025 MG tablet TAKE (2) TABLETS BY MOUTH TWICE DAILY.  Marland Kitchen HYDROcodone-acetaminophen (NORCO/VICODIN) 5-325 MG tablet Take 1-2 tablets by mouth every 4 (four) hours as needed for moderate pain or severe pain.  Marland Kitchen insulin detemir (LEVEMIR) 100 UNIT/ML injection Inject 40  Units into the skin every morning.   . insulin lispro (HUMALOG) 100 UNIT/ML injection Inject 5-30 Units into the skin 2 (two) times daily with a meal. Sliding Scale per patient  . lenalidomide ([name]) 5 MG capsule TAKE 1 CAPSULE BY MOUTH DAILY FOR 7 DAYS ON FOLLOWED BY 7 DAYS OFF.  Marland Kitchen lipase/protease/amylase (CREON) 36000 UNITS CPEP capsule TAKE 5 CAPSULES BY MOUTH DAILY WITH MEALS AND 2 CAPSULES WITH SNACKS.  Marland Kitchen lisinopril (PRINIVIL,ZESTRIL) 5 MG tablet Take 5 mg by mouth every other day.  . loratadine (CLARITIN) 10 MG tablet Take 10 mg by mouth every morning.   . metoprolol succinate (TOPROL-XL) 25 MG 24 hr tablet TAKE ONE TABLET BY MOUTH ONCE DAILY.  . Multiple Vitamins-Minerals (MULTIVITAMINS THER. W/MINERALS) TABS Take 1 tablet by mouth daily.   . nitroGLYCERIN (NITROSTAT) 0.4 MG SL tablet Place 1 tablet (0.4 mg total) under the tongue every 5 (five) minutes as needed for chest pain.  . sodium bicarbonate 650 MG tablet Take 1,300 mg by mouth 3 (three) times daily.  Marland Kitchen sulfamethoxazole-trimethoprim (BACTRIM DS,SEPTRA DS) 800-160 MG tablet TAKE ONE TABLET BY MOUTH EVERY MONDAY, WEDNESDAY, AND FRIDAY.  Marland Kitchen torsemide (DEMADEX) 20 MG tablet Take 40 mg by mouth daily.  Marland Kitchen torsemide (DEMADEX) 20 MG tablet TAKE 2 TABLETS BY MOUTH EACH MORNING AND 1 TABLET EACH EVENING.  . Vitamin D, Ergocalciferol, (DRISDOL) 50000 units CAPS capsule TAKE 1 CAPSULE BY MOUTH EVERY THIRTY DAYS.  Marland Kitchen XARELTO 20 MG TABS tablet TAKE 1 TABLET BY MOUTH DAILY.  . [DISCONTINUED] dicyclomine (BENTYL) 20 MG tablet Take 1 tablet (20 mg total) by mouth 3 (three) times daily before meals.  . [DISCONTINUED] HYDROcodone-acetaminophen (NORCO/VICODIN) 5-325 MG per tablet Take 1-2 tablets by mouth every 4 (four) hours as needed for moderate pain or severe pain.  . [DISCONTINUED] potassium chloride SA (K-DUR,KLOR-CON) 20 MEQ tablet TAKE 2 TABLETS BY MOUTH TWICE DAILY.   Facility-Administered Encounter Medications as of 08/19/2016  Medication    . sodium chloride 0.9 % injection 10 mL    ALLERGIES:  No Known Allergies   PHYSICAL EXAM:  ECOG Performance status: 1-2 - Symptomatic; requires occasional assistance.   Vitals:   08/19/16 1056  BP: 126/89  Pulse: 60  Resp: 18  Temp: 98.7 F (37.1 C)   Filed Weights   08/19/16 1056  Weight: (!) 366 lb (166 kg)    Physical Exam  Constitutional: He is oriented to person, place, and time and well-developed, well-nourished, and in no distress.  HENT:  Head: Normocephalic.  Mouth/Throat: Oropharynx is clear and moist. No oropharyngeal exudate.  Eyes: Conjunctivae are normal. Pupils are equal, round, and reactive to light. No scleral icterus.  Neck: Normal range of motion. Neck supple.  Cardiovascular: Normal rate, regular rhythm and normal heart sounds.   Pulmonary/Chest: Effort normal and breath sounds normal. No respiratory distress. He has no wheezes.  Abdominal: Soft. Bowel sounds are normal. There is no tenderness.  Musculoskeletal: He exhibits edema (2+ BLE edema).  Lymphadenopathy:    He has no cervical adenopathy.       Right: No supraclavicular adenopathy present.       Left: No supraclavicular adenopathy present.  Neurological: He is alert and oriented to person, place, and time. No cranial nerve deficit.  Skin: Skin is warm and dry. No rash noted.  BLE/foot wounds (wrapped/dressed); managed by wound center.   Psychiatric: Mood, memory, affect and judgment normal.  Nursing note and vitals reviewed.    LABORATORY DATA:  I have reviewed the labs as listed.  CBC    Component Value Date/Time   WBC 7.4 08/19/2016 1111   RBC 3.40 (L) 08/19/2016 1111   HGB 10.5 (L) 08/19/2016 1111   HCT 31.7 (L) 08/19/2016 1111   PLT 145 (L) 08/19/2016 1111   MCV 93.2 08/19/2016 1111   MCH 30.9 08/19/2016 1111   MCHC 33.1 08/19/2016 1111   RDW 16.4 (H) 08/19/2016 1111   LYMPHSABS 2.1 08/19/2016 1111   MONOABS 0.7 08/19/2016 1111   EOSABS 0.3 08/19/2016 1111   BASOSABS  0.0 08/19/2016 1111   CMP Latest Ref Rng & Units 08/19/2016 06/12/2016 06/11/2016  Glucose 65 - 99 mg/dL 76 65 169(H)  BUN 6 - 20 mg/dL 36(H) 29(H) 38(H)  Creatinine 0.61 - 1.24 mg/dL 1.70(H) 1.74(H) 1.90(H)  Sodium 135 - 145 mmol/L 139 141 138  Potassium 3.5 - 5.1 mmol/L 3.4(L) 3.9 4.0  Chloride 101 - 111 mmol/L 110 114(H) 108  CO2 22 - 32 mmol/L 21(L) 26 23  Calcium 8.9 - 10.3 mg/dL 8.2(L) 7.5(L) 7.5(L)  Total Protein 6.5 - 8.1 g/dL 6.0(L) - -  Total Bilirubin 0.3 - 1.2 mg/dL 0.8 - -  Alkaline Phos 38 - 126 U/L 96 - -  AST 15 - 41 U/L 29 - -  ALT 17 - 63 U/L 45 - -    PENDING LABS:    DIAGNOSTIC IMAGING:    PATHOLOGY:  Bone marrow biopsy: 09/28/14    ASSESSMENT & PLAN:   Stage II kappa light chain multiple myeloma, in remission:  -Diagnosed in 09/2014; 3 g proteinuria at diagnosis; Bence-Jones proteinuria. Bone marrow bx with 19% plasma cells (kappa restricted) at time of diagnosis.  He was evaluated at [facility] for stem cell transplant consideration; he was not felt to be an ideal candidate for transplant.  -We discussed at length that his disease has remained in remission for quite some time. He remains reluctant to stop the [name] and Decadron for fear of recurrence, particularly given that his mother died from multiple myeloma a few years ago.  I provided reassurance and understanding. From a multiple myeloma standpoint, he is doing very well.  Low-dose [name] on current schedule may not be offering him much clinical benefit at this time.  Dr. Whitney Muse has discussed with the patient in past about discontinuing [name]/Decadron; patient  was previously resistant given fear of recurrence.  Expressed my concern that the Decadron is an immune suppressant and can further complicate effective blood sugar control in patients with diabetes.  He is reconsidering stopping treatment. He understands that we would continue to monitor his myeloma labs periodically and if there was signs of  recurrence, we could resume therapy.  He would like to think about this further, which is reasonable.  -Return to cancer center in 2 months for further discussion; patient considering stopping [name]/Decadron given recurrent leg wounds.   Hypogammaglobinemia:  -His last IVIG infusion was in 04/2013. -Will collect antibodies today. If IgG is very low, could consider replacing with IVIG given recent hospital admission for flu/pneumonia/UTI, as well as recurrent BLE/feet wounds at risk for infection.     Bone health:  -Last Zometa 09/07/14; stopped d/t concerns regarding possible osteonecrosis of jaw.   Rectal pain:  -Explained that I am happy to give him a short course of Hydrocodone. However, his rectal pain is best managed by his GI specialist or PCP since his pain is likely unrelated to his history of myeloma. He has follow-up with his PCP within the next 3 weeks and follow-up with GI within the next month. Paper prescription for Hydrocodone 5/325, take 1-2 tabs Q4Hprn, #20 given to patient.   Bilateral leg/feet recurrent wounds:  -Likely secondary to diabetes. We discussed that certainly chronic use of steroids can make effective management of diabetes difficult.   -Continue wound management as directed.    Dispo:  -Labs today.  -Return to cancer center in 2 months for follow-up.    All questions were answered to patient's stated satisfaction. Encouraged patient to call with any new concerns or questions before his next visit to the cancer center and we can certain see him sooner, if needed.      Orders placed this encounter:  Orders Placed This Encounter  Procedures  . CBC with Differential/Platelet  . Comprehensive metabolic panel  . IgG, IgA, IgM  . Immunofixation electrophoresis  . Protein electrophoresis, serum      Mike Craze, NP [place] [phone]

## 2016-08-20 ENCOUNTER — Other Ambulatory Visit (HOSPITAL_COMMUNITY): Payer: Self-pay | Admitting: Oncology

## 2016-08-20 DIAGNOSIS — C9001 Multiple myeloma in remission: Secondary | ICD-10-CM

## 2016-08-20 LAB — PROTEIN ELECTROPHORESIS, SERUM
A/G Ratio: 1.6 (ref 0.7–1.7)
Albumin ELP: 3.2 g/dL (ref 2.9–4.4)
Alpha-1-Globulin: 0.2 g/dL (ref 0.0–0.4)
Alpha-2-Globulin: 0.7 g/dL (ref 0.4–1.0)
Beta Globulin: 0.8 g/dL (ref 0.7–1.3)
Gamma Globulin: 0.3 g/dL — ABNORMAL LOW (ref 0.4–1.8)
Globulin, Total: 2 g/dL — ABNORMAL LOW (ref 2.2–3.9)
Total Protein ELP: 5.2 g/dL — ABNORMAL LOW (ref 6.0–8.5)

## 2016-08-20 LAB — IGG, IGA, IGM
IgA: 53 mg/dL — ABNORMAL LOW (ref 90–386)
IgG (Immunoglobin G), Serum: 305 mg/dL — ABNORMAL LOW (ref 700–1600)
IgM, Serum: 48 mg/dL (ref 20–172)

## 2016-08-20 LAB — KAPPA/LAMBDA LIGHT CHAINS
Kappa free light chain: 21.5 mg/L — ABNORMAL HIGH (ref 3.3–19.4)
Kappa, lambda light chain ratio: 1.01 (ref 0.26–1.65)
Lambda free light chains: 21.3 mg/L (ref 5.7–26.3)

## 2016-08-20 MED ORDER — LENALIDOMIDE 5 MG PO CAPS: ORAL_CAPSULE | ORAL | 0 refills | Status: DC

## 2016-08-22 ENCOUNTER — Ambulatory Visit (INDEPENDENT_AMBULATORY_CARE_PROVIDER_SITE_OTHER): Payer: BLUE CROSS/BLUE SHIELD | Admitting: Cardiovascular Disease

## 2016-08-22 ENCOUNTER — Encounter: Payer: Self-pay | Admitting: Cardiovascular Disease

## 2016-08-22 VITALS — BP 118/74 | HR 61 | Ht 75.0 in | Wt 365.0 lb

## 2016-08-22 DIAGNOSIS — I25118 Atherosclerotic heart disease of native coronary artery with other forms of angina pectoris: Secondary | ICD-10-CM | POA: Diagnosis not present

## 2016-08-22 DIAGNOSIS — I10 Essential (primary) hypertension: Secondary | ICD-10-CM

## 2016-08-22 DIAGNOSIS — I428 Other cardiomyopathies: Secondary | ICD-10-CM

## 2016-08-22 DIAGNOSIS — I4892 Unspecified atrial flutter: Secondary | ICD-10-CM | POA: Diagnosis not present

## 2016-08-22 DIAGNOSIS — I5022 Chronic systolic (congestive) heart failure: Secondary | ICD-10-CM

## 2016-08-22 NOTE — Patient Instructions (Signed)
Your physician wants you to follow-up in: 6 months with Dr Koneswaran You will receive a reminder letter in the mail two months in advance. If you don't receive a letter, please call our office to schedule the follow-up appointment.    Your physician recommends that you continue on your current medications as directed. Please refer to the Current Medication list given to you today.      If you need a refill on your cardiac medications before your next appointment, please call your pharmacy.      Thank you for choosing [facility] !        

## 2016-08-22 NOTE — Progress Notes (Signed)
SUBJECTIVE: The patient presents for routine follow-up. He was hospitalized for sepsis due to community acquired pneumonia from influenza as well as urinary tract infection in January 2018. He had problems with left lower extremity ulcer due to venous stasis. He has chronic systolic heart failure and coronary artery disease with prior myocardial infarction and stent placement. He also has atrial flutter with prior cardioversion.  He also has chronic kidney disease, sleep apnea, hypertension, and multiple myeloma.  Echocardiogram 03/13/16: Moderately reduced left ventricle systolic function, LVEF 75-88%, wall motion abnormalities consistent with ischemic heart disease, moderate left atrial dilatation. Diastolic function was reportedly normal.  His multiple myeloma has remained in remission for some time. He has been using lymphedema pumps for the past 3 weeks every morning and evening has noticed increased strength in his legs. He wears knee-high compression stockings and is awaiting thigh-high compression stockings. He denies exertional chest pain. He has been dealing with explosive diarrhea and follows with gastroenterology.    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  Review of Systems: As per "subjective", otherwise negative.  No Known Allergies  Current Outpatient Prescriptions  Medication Sig Dispense Refill  . acyclovir (ZOVIRAX) 400 MG tablet TAKE ONE TABLET BY MOUTH EVERY MORNING. 30 tablet 5  . allopurinol (ZYLOPRIM) 300 MG tablet TAKE ONE TABLET BY MOUTH ONCE DAILY. 30 tablet 3  . atorvastatin (LIPITOR) 80 MG tablet TAKE ONE TABLET BY MOUTH AT BEDTIME. 30 tablet 6  . calcitRIOL (ROCALTROL) 0.25 MCG capsule Take 0.25 mcg by mouth 3 (three) times a week. Monday, Wednesday, Friday.    . calcium carbonate (OS-CAL) 600 MG TABS tablet Take 1,800 mg by mouth 2 (two) times daily.     . cefpodoxime (VANTIN) 200 MG tablet Take 1 tablet (200 mg total) by mouth 2 (two)  times daily. 10 tablet 0  . cholestyramine (QUESTRAN) 4 g packet Take 1 packet (4 g total) by mouth 2 (two) times daily. 60 each 2  . dexamethasone (DECADRON) 4 MG tablet TAKE 5 TABLETS BY MOUTH 2 TIMES A DAY EVERY FRIDAY. 40 tablet 0  . dexlansoprazole (DEXILANT) 60 MG capsule Take 1 capsule (60 mg total) by mouth daily. 30 capsule 11  . diphenoxylate-atropine (LOMOTIL) 2.5-0.025 MG tablet TAKE (2) TABLETS BY MOUTH TWICE DAILY. 120 tablet 5  . HYDROcodone-acetaminophen (NORCO/VICODIN) 5-325 MG tablet Take 1-2 tablets by mouth every 4 (four) hours as needed for moderate pain or severe pain. 20 tablet 0  . insulin detemir (LEVEMIR) 100 UNIT/ML injection Inject 40 Units into the skin every morning.     . insulin lispro (HUMALOG) 100 UNIT/ML injection Inject 5-30 Units into the skin 2 (two) times daily with a meal. Sliding Scale per patient    . lenalidomide ([name]) 5 MG capsule TAKE 1 CAPSULE BY MOUTH DAILY FOR 7 DAYS ON FOLLOWED BY 7 DAYS OFF. 14 capsule 0  . lipase/protease/amylase (CREON) 36000 UNITS CPEP capsule TAKE 5 CAPSULES BY MOUTH DAILY WITH MEALS AND 2 CAPSULES WITH SNACKS. 228 capsule 11  . lisinopril (PRINIVIL,ZESTRIL) 5 MG tablet Take 5 mg by mouth every other day.    . loratadine (CLARITIN) 10 MG tablet Take 10 mg by mouth every morning.     . metoprolol succinate (TOPROL-XL) 25 MG 24 hr tablet TAKE ONE TABLET BY MOUTH ONCE DAILY. 30 tablet 6  . Multiple Vitamins-Minerals (MULTIVITAMINS THER. W/MINERALS) TABS Take 1 tablet by mouth daily.     . nitroGLYCERIN (NITROSTAT) 0.4 MG SL tablet  Place 1 tablet (0.4 mg total) under the tongue every 5 (five) minutes as needed for chest pain. 25 tablet 3  . potassium chloride SA (K-DUR,KLOR-CON) 20 MEQ tablet TAKE 2 TABLETS BY MOUTH TWICE DAILY. 120 tablet 1  . sodium bicarbonate 650 MG tablet Take 1,300 mg by mouth 3 (three) times daily.    Marland Kitchen sulfamethoxazole-trimethoprim (BACTRIM DS,SEPTRA DS) 800-160 MG tablet TAKE ONE TABLET BY MOUTH EVERY  MONDAY, WEDNESDAY, AND FRIDAY. 12 tablet 0  . torsemide (DEMADEX) 20 MG tablet TAKE 2 TABLETS BY MOUTH EACH MORNING AND 1 TABLET EACH EVENING. 240 tablet 0  . Vitamin D, Ergocalciferol, (DRISDOL) 50000 units CAPS capsule TAKE 1 CAPSULE BY MOUTH EVERY THIRTY DAYS. 10 capsule 1  . XARELTO 20 MG TABS tablet TAKE 1 TABLET BY MOUTH DAILY. 30 tablet 0   No current facility-administered medications for this visit.    Facility-Administered Medications Ordered in Other Visits  Medication Dose Route Frequency Provider Last Rate Last Dose  . sodium chloride 0.9 % injection 10 mL  10 mL Intravenous Once Farrel Gobble, MD        Past Medical History:  Diagnosis Date  . Arteriosclerotic cardiovascular disease (ASCVD)    MI-2000s; stent to the proximal LAD and diagonal in 2001; stress nuclear in 2008-impaired exercise capacity, left ventricular dilatation, moderately to severely depressed EF, apical, inferior and anteroseptal scar  . Atrial flutter ([place])   . 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 Cellulitis of leg    both legs  . Chronic diarrhea   . Chronic kidney disease, stage 3, mod decreased GFR    Creatinine of 1.84 in 06/2011 and 1.5 in 07/2011  . Diabetes mellitus    Insulin  . GERD (gastroesophageal reflux disease)   . Gout   . Hyperlipidemia   . Hypertension   . Injection site reaction   . Multiple myeloma 07/01/2011  . Myocardial infarction 2000  . Obesity   . Pedal edema    Venous insufficiency  . Sleep apnea    uses cpap  . Ulcer [facility])     Past Surgical History:  Procedure Laterality Date  . ABSCESS DRAINAGE     Scrotal  . BIOPSY  01/02/2012   Procedure: BIOPSY;  Surgeon: Rogene Houston, MD;  [facility];  Service: Endoscopy;  Laterality: N/A;  . BONE MARROW BIOPSY  05/13/11  . CARDIAC CATHETERIZATION     cardiac stent  . CARDIOVERSION N/A 10/13/2012   Procedure: CARDIOVERSION;  Surgeon: Yehuda Savannah, MD;  Location: [facility] OR[facility];  Service: Cardiovascular;   Laterality: N/A;  . CATARACT EXTRACTION W/PHACO Left 02/13/2014   Procedure: CATARACT EXTRACTION PHACO AND INTRAOCULAR LENS PLACEMENT ([place]);  Surgeon: Tonny Branch, MD;  Location: [facility] OR[facility];  Service: Ophthalmology;  Laterality: Left;  CDE:  7.67  . CATARACT EXTRACTION W/PHACO Right 03/02/2014   Procedure: CATARACT EXTRACTION PHACO AND INTRAOCULAR LENS PLACEMENT RIGHT EYE CDE=16.81;  Surgeon: Tonny Branch, MD;  Location: [facility];  Service: Ophthalmology;  Laterality: Right;  . COLONOSCOPY  11/28/2011   Procedure: COLONOSCOPY;  Surgeon: Rogene Houston, MD;  [facility];  Service: Endoscopy;  Laterality: N/A;  930  . ESOPHAGOGASTRODUODENOSCOPY  01/02/2012   Procedure: ESOPHAGOGASTRODUODENOSCOPY (EGD);  Surgeon: Rogene Houston, MD;  [facility];  Service: Endoscopy;  Laterality: N/A;  100  . ESOPHAGOGASTRODUODENOSCOPY N/A 09/20/2012   Procedure: ESOPHAGOGASTRODUODENOSCOPY (EGD);  Surgeon: Rogene Houston, MD;  [facility];  Service: Endoscopy;  Laterality: N/A;  .  EUS N/A 10/07/2012   Procedure: UPPER ENDOSCOPIC ULTRASOUND (EUS) LINEAR;  Surgeon: Milus Banister, MD;  Location: [facility];  Service: Endoscopy;  Laterality: N/A;  . INCISION AND DRAINAGE ABSCESS ANAL    . LAPAROSCOPIC GASTRIC BANDING  2006   has been removed  . PORT-A-CATH REMOVAL Left 12/07/2012   Procedure: REMOVAL PORT-A-CATH;  Surgeon: Scherry Ran, MD;  Location: [facility];  Service: General;  Laterality: Left;  . PORTACATH PLACEMENT  07/07/2011   Procedure: INSERTION PORT-A-CATH;  Surgeon: Scherry Ran, MD;  Location: [facility];  Service: General;  Laterality: N/A;  . PORTACATH PLACEMENT N/A 12/07/2012   Procedure: INSERTION PORT-A-CATH;  Surgeon: Scherry Ran, MD;  Location: [facility];  Service: General;  Laterality: N/A;  Attempted portacath placement on left and right side  . WRIST SURGERY     Left; removal of bone fragment    Social History   Social History  . Marital status: Married     Spouse name: N/A  . Number of children: N/A  . Years of education: 74   Occupational History  . Nurse, learning disability   . [place]   Social History Main Topics  . Smoking status: Former Smoker    Packs/day: 0.25    Years: 1.00    Types: Cigarettes, Cigars    Quit date: 05/17/2001  . Smokeless tobacco: Never Used  . Alcohol use No  . Drug use: No  . Sexual activity: Yes    Birth control/ protection: None   Other Topics Concern  . Not on file   Social History Narrative  . No narrative on file     Vitals:   08/22/16 1628  BP: 118/74  Pulse: 61  Weight: (!) 365 lb (165.6 kg)  Height: _0  (1.905 m)    PHYSICAL EXAM General: NAD HEENT: Normal. Neck: No JVD, no thyromegaly. Lungs: Faint expiratory wheezes, no rales. CV: Nondisplaced PMI.  Regular rate and rhythm, normal S1/S2, no S3/S4, no murmur. Wearing compression stockings. Abdomen: Obese. Neurologic: Alert and oriented.  Psych: Normal affect. Musculoskeletal: Wearing boot on left foot.    ECG: Most recent ECG reviewed.      ASSESSMENT AND PLAN: 1. CAD with prior MI and stents: Symptomatically stable. Continue Lipitor, lisinopril, and metoprolol. Not on aspirin as he is currently taking Xarelto and has anemia related to multiple myeloma which is in remission.  2. Ischemic cardiomyopathy/chronic systolic heart failure: Euvolemic on torsemide 40 mg every morning and 20 mg every evening. No changes to therapy. Continue metoprolol and lisinopril. He wears compression stockings and uses lymphedema pumps.  3. Hypertension: Controlled. No changes.  4. Atrial flutter: Symptomatically stable on metoprolol succinate. Continue Xarelto for anticoagulation.  5. Morbid obesity: Needs weight loss.   Dispo: fu 1 year.  Kate Sable, M.D., F.A.C.C.

## 2016-08-25 LAB — IMMUNOFIXATION ELECTROPHORESIS
IgA: 55 mg/dL — ABNORMAL LOW (ref 90–386)
IgG (Immunoglobin G), Serum: 303 mg/dL — ABNORMAL LOW (ref 700–1600)
IgM, Serum: 49 mg/dL (ref 20–172)
Total Protein ELP: 5.2 g/dL — ABNORMAL LOW (ref 6.0–8.5)

## 2016-09-02 ENCOUNTER — Other Ambulatory Visit (HOSPITAL_COMMUNITY): Payer: Self-pay | Admitting: Oncology

## 2016-09-02 ENCOUNTER — Other Ambulatory Visit: Payer: Self-pay | Admitting: Cardiovascular Disease

## 2016-09-26 ENCOUNTER — Telehealth (INDEPENDENT_AMBULATORY_CARE_PROVIDER_SITE_OTHER): Payer: Self-pay | Admitting: Internal Medicine

## 2016-09-26 NOTE — Telephone Encounter (Signed)
Patient called, stated that he's our of Creon.  He needs more or suggestions on what to take.  [phone]

## 2016-09-26 NOTE — Telephone Encounter (Signed)
Patient was given samples of Creon per Dr.Rehman.

## 2016-10-01 ENCOUNTER — Other Ambulatory Visit (HOSPITAL_COMMUNITY): Payer: Self-pay | Admitting: Oncology

## 2016-10-02 ENCOUNTER — Other Ambulatory Visit (HOSPITAL_COMMUNITY): Payer: Self-pay

## 2016-10-02 DIAGNOSIS — C9001 Multiple myeloma in remission: Secondary | ICD-10-CM

## 2016-10-02 MED ORDER — LENALIDOMIDE 5 MG PO CAPS: ORAL_CAPSULE | ORAL | 0 refills | Status: DC

## 2016-10-03 ENCOUNTER — Telehealth: Payer: Self-pay | Admitting: Cardiology

## 2016-10-03 NOTE — Telephone Encounter (Signed)
Patient needs to speak with nurse regarding Xarelto and needing to switch due to price. / tg

## 2016-10-03 NOTE — Telephone Encounter (Signed)
Patient wants med change but doesn't want poison ( coumadin ?)

## 2016-10-05 NOTE — Telephone Encounter (Signed)
Can try Eliquis or Pradaxa if either is more cost effective.

## 2016-10-06 ENCOUNTER — Telehealth (INDEPENDENT_AMBULATORY_CARE_PROVIDER_SITE_OTHER): Payer: Self-pay | Admitting: Internal Medicine

## 2016-10-06 NOTE — Telephone Encounter (Signed)
Patient called after hours on 10/03/16 and requested Creon samples.  Stated that he is out of it.  [phone]

## 2016-10-06 NOTE — Telephone Encounter (Signed)
Patient called and advised that we did not have samples of the Creon 36,000 units at this time. Patient states that he has a big bottle of the Zenpep that he has been taking,since he is running low on the Creon. He also says that they work about the same. IT was suggested to the patient to take the Zenpep , and when we got samples of Creon we would call him.  Patient 's Insurance will not cover the Creon this year but are covering the Zen Pep.

## 2016-10-07 NOTE — Telephone Encounter (Signed)
Pt has $200 deductible afterwards [employer] will call patient if she can apply a discount card,after deductible cost is $40 month, pt aware

## 2016-10-14 ENCOUNTER — Other Ambulatory Visit (HOSPITAL_COMMUNITY): Payer: Self-pay | Admitting: Oncology

## 2016-10-20 ENCOUNTER — Encounter (HOSPITAL_COMMUNITY): Payer: Self-pay | Admitting: Oncology

## 2016-10-20 ENCOUNTER — Encounter (HOSPITAL_COMMUNITY): Payer: BLUE CROSS/BLUE SHIELD

## 2016-10-20 ENCOUNTER — Encounter (HOSPITAL_COMMUNITY): Payer: BLUE CROSS/BLUE SHIELD | Attending: Oncology | Admitting: Oncology

## 2016-10-20 VITALS — BP 111/53 | HR 58 | Temp 98.4°F | Resp 18 | Ht 75.0 in | Wt 370.0 lb

## 2016-10-20 DIAGNOSIS — E1122 Type 2 diabetes mellitus with diabetic chronic kidney disease: Secondary | ICD-10-CM | POA: Insufficient documentation

## 2016-10-20 DIAGNOSIS — C9001 Multiple myeloma in remission: Secondary | ICD-10-CM | POA: Diagnosis present

## 2016-10-20 DIAGNOSIS — I129 Hypertensive chronic kidney disease with stage 1 through stage 4 chronic kidney disease, or unspecified chronic kidney disease: Secondary | ICD-10-CM | POA: Diagnosis not present

## 2016-10-20 DIAGNOSIS — K580 Irritable bowel syndrome with diarrhea: Secondary | ICD-10-CM | POA: Insufficient documentation

## 2016-10-20 DIAGNOSIS — D696 Thrombocytopenia, unspecified: Secondary | ICD-10-CM | POA: Diagnosis not present

## 2016-10-20 DIAGNOSIS — D649 Anemia, unspecified: Secondary | ICD-10-CM | POA: Diagnosis not present

## 2016-10-20 DIAGNOSIS — Z87891 Personal history of nicotine dependence: Secondary | ICD-10-CM | POA: Insufficient documentation

## 2016-10-20 DIAGNOSIS — I252 Old myocardial infarction: Secondary | ICD-10-CM | POA: Insufficient documentation

## 2016-10-20 DIAGNOSIS — M545 Low back pain: Secondary | ICD-10-CM | POA: Diagnosis not present

## 2016-10-20 DIAGNOSIS — K219 Gastro-esophageal reflux disease without esophagitis: Secondary | ICD-10-CM | POA: Diagnosis not present

## 2016-10-20 DIAGNOSIS — N183 Chronic kidney disease, stage 3 (moderate): Secondary | ICD-10-CM | POA: Diagnosis not present

## 2016-10-20 DIAGNOSIS — I4892 Unspecified atrial flutter: Secondary | ICD-10-CM | POA: Insufficient documentation

## 2016-10-20 DIAGNOSIS — M109 Gout, unspecified: Secondary | ICD-10-CM | POA: Insufficient documentation

## 2016-10-20 DIAGNOSIS — E785 Hyperlipidemia, unspecified: Secondary | ICD-10-CM | POA: Insufficient documentation

## 2016-10-20 DIAGNOSIS — D801 Nonfamilial hypogammaglobulinemia: Secondary | ICD-10-CM | POA: Insufficient documentation

## 2016-10-20 DIAGNOSIS — E1165 Type 2 diabetes mellitus with hyperglycemia: Secondary | ICD-10-CM | POA: Insufficient documentation

## 2016-10-20 DIAGNOSIS — I2510 Atherosclerotic heart disease of native coronary artery without angina pectoris: Secondary | ICD-10-CM | POA: Insufficient documentation

## 2016-10-20 DIAGNOSIS — Z95828 Presence of other vascular implants and grafts: Secondary | ICD-10-CM

## 2016-10-20 DIAGNOSIS — G4730 Sleep apnea, unspecified: Secondary | ICD-10-CM | POA: Insufficient documentation

## 2016-10-20 LAB — CBC WITH DIFFERENTIAL/PLATELET
Basophils Absolute: 0 10*3/uL (ref 0.0–0.1)
Basophils Relative: 0 %
Eosinophils Absolute: 0.4 10*3/uL (ref 0.0–0.7)
Eosinophils Relative: 6 %
HCT: 30.2 % — ABNORMAL LOW (ref 39.0–52.0)
Hemoglobin: 9.9 g/dL — ABNORMAL LOW (ref 13.0–17.0)
Lymphocytes Relative: 30 %
Lymphs Abs: 2 10*3/uL (ref 0.7–4.0)
MCH: 30.8 pg (ref 26.0–34.0)
MCHC: 32.8 g/dL (ref 30.0–36.0)
MCV: 94.1 fL (ref 78.0–100.0)
Monocytes Absolute: 0.4 10*3/uL (ref 0.1–1.0)
Monocytes Relative: 7 %
Neutro Abs: 3.8 10*3/uL (ref 1.7–7.7)
Neutrophils Relative %: 57 %
Platelets: 137 10*3/uL — ABNORMAL LOW (ref 150–400)
RBC: 3.21 MIL/uL — ABNORMAL LOW (ref 4.22–5.81)
RDW: 16.5 % — ABNORMAL HIGH (ref 11.5–15.5)
WBC: 6.7 10*3/uL (ref 4.0–10.5)

## 2016-10-20 LAB — COMPREHENSIVE METABOLIC PANEL
ALT: 82 U/L — ABNORMAL HIGH (ref 17–63)
AST: 70 U/L — ABNORMAL HIGH (ref 15–41)
Albumin: 3.1 g/dL — ABNORMAL LOW (ref 3.5–5.0)
Alkaline Phosphatase: 98 U/L (ref 38–126)
Anion gap: 6 (ref 5–15)
BUN: 31 mg/dL — ABNORMAL HIGH (ref 6–20)
CO2: 19 mmol/L — ABNORMAL LOW (ref 22–32)
Calcium: 8.2 mg/dL — ABNORMAL LOW (ref 8.9–10.3)
Chloride: 114 mmol/L — ABNORMAL HIGH (ref 101–111)
Creatinine, Ser: 1.63 mg/dL — ABNORMAL HIGH (ref 0.61–1.24)
GFR calc Af Amer: 52 mL/min — ABNORMAL LOW (ref >60)
GFR calc non Af Amer: 45 mL/min — ABNORMAL LOW (ref >60)
Glucose, Bld: 162 mg/dL — ABNORMAL HIGH (ref 65–99)
Potassium: 3.8 mmol/L (ref 3.5–5.1)
Sodium: 139 mmol/L (ref 135–145)
Total Bilirubin: 0.4 mg/dL (ref 0.3–1.2)
Total Protein: 5.6 g/dL — ABNORMAL LOW (ref 6.5–8.1)

## 2016-10-20 MED ORDER — HEPARIN SOD (PORK) LOCK FLUSH 100 UNIT/ML IV SOLN
500.0000 [IU] | Freq: Once | INTRAVENOUS | Status: AC
  Administered 2016-10-20: 500 [IU] via INTRAVENOUS
  Filled 2016-10-20: qty 5

## 2016-10-20 MED ORDER — SODIUM CHLORIDE 0.9% FLUSH
10.0000 mL | INTRAVENOUS | Status: DC | PRN
  Administered 2016-10-20: 10 mL via INTRAVENOUS
  Filled 2016-10-20: qty 10

## 2016-10-20 NOTE — Patient Instructions (Signed)
[place] at [facility] Discharge Instructions  RECOMMENDATIONS MADE BY THE CONSULTANT AND ANY TEST RESULTS WILL BE SENT TO YOUR REFERRING PHYSICIAN.  You were seen today by [name] PA-C. Continue taking the [name] and Dexamethasone. Take Advil/Aleve two times a day for 1 week. Use ice on lower back, if not any better call back. Port flush today and again in 6-8 weeks. Skeletal survey, labs and follow up in 3 months.     Thank you for choosing [place] at [facility] to provide your oncology and hematology care.  To afford each patient quality time with our provider, please arrive at least 15 minutes before your scheduled appointment time.    If you have a lab appointment with the [facility] please come in thru the  Main Entrance and check in at the main information desk  You need to re-schedule your appointment should you arrive 10 or more minutes late.  We strive to give you quality time with our providers, and arriving late affects you and other patients whose appointments are after yours.  Also, if you no show three or more times for appointments you may be dismissed from the clinic at the providers discretion.     Again, thank you for choosing [facility].  Our hope is that these requests will decrease the amount of time that you wait before being seen by our physicians.       _____________________________________________________________  Should you have questions after your visit to [facility], please contact our office at [phone] between the hours of 8:30 a.m. and 4:30 p.m.  Voicemails left after 4:30 p.m. will not be returned until the following business day.  For prescription refill requests, have your pharmacy contact our office.       Resources For Cancer Patients and their Caregivers ? American Cancer Society: Can assist with transportation, wigs, general needs, runs Look Good Feel  Better.        385-096-4342 ? Cancer Care: Provides financial assistance, online support groups, medication/co-pay assistance.  1-800-813-HOPE 517-413-4529) ? [place] Assists [place] cancer patients and their families through emotional , educational and financial support.  5161768653 ? [place] DSS Where to apply for food stamps, Medicaid and utility assistance. 313-081-9031 ? [facility]: Transportation to medical appointments. (781)224-4844 ? Social Security Administration: May apply for disability if have a Stage IV cancer. 825-511-7064 913-747-7696 ? [employer], Disability and Transit Services: Assists with nutrition, care and transit needs. [place] Support Programs: @10RELATIVEDAYS @ > Cancer Support Group  2nd Tuesday of the month 1pm-2pm, Journey Room  > Creative Journey  3rd Tuesday of the month 1130am-1pm, Journey Room  > Look Good Feel Better  1st Wednesday of the month 10am-12 noon, Journey Room (Call [place] to register [phone])

## 2016-10-20 NOTE — Assessment & Plan Note (Addendum)
Stage II by ISS, kappa multiple myeloma, in remission.  Dx in 09/2014 with 3 g proteinuria (Bence-Jones).  He was evaluated at [facility] for stem cell transplant, but was not felt to be an ideal candidate.  At time of relapse, I think it would be reasonable for the patient to be re-consulted for consideration of BMT with Dr. Norma Fredrickson at [facility]  Labs today: CBC diff, CMET, SPEP+IFE, light chain assay.  I personally reviewed and went over laboratory results with the patient.  The results are noted within this dictation.  Anemia and thrombocytopenia are stable. Mild elevation of AST/ALT noted.  Unknown reason at this time. Renal function is stable.  Labs in 3 months: CBC diff, CMET, CRP, LDH, SPEP+IFE, light chain assay, and B2M.  Ongoing discussion regarding continued Rev/Dex given his DM and other co-morbidities.  He wishes to continue which is reasonable.  He understands the risks associated with ongoing dexamethasone.  He reports a lumbar back pain that radiates the right.  It increases with particular movements.  I recommended ice and anti-inflammatory twice daily 1 week.  He is advised not to take anti-inflammatory past 1 week.  If back pain/muscle spasm persist, I would not hesitate to give a short course of Flexeril.  He is due for a skeletal survey.  Order is placed.  He has GI follow-up tomorrow with Dr. Laural Golden.  He continues to have issues with diarrhea.  Return in 3 months for follow-up.

## 2016-10-20 NOTE — Progress Notes (Signed)
Roda Shutters presented for Portacath access and flush.  Portacath located right  chest wall accessed with  H 20 needle.  Good blood return present. Portacath flushed with 64ml NS and 500U/19ml Heparin and needle removed intact.  Procedure tolerated well and without incident.  Pt stable and discharged home ambulatory.

## 2016-10-20 NOTE — Progress Notes (Signed)
[name], MD [address] [place] [address]  Port catheter in place - Plan: Schedule Portacath Flush Appointment, heparin lock flush 100 unit/mL, sodium chloride flush (NS) 0.9 % injection 10 mL  Multiple myeloma in remission ([place]) - Plan: CBC with Differential, Comprehensive metabolic panel, Lactate dehydrogenase, C-reactive protein, Kappa/lambda light chains, Beta 2 microglobuline, serum, IgG, IgA, IgM, Immunofixation electrophoresis, Protein electrophoresis, serum  CURRENT THERAPY: [name]/Dexamethasone  INTERVAL HISTORY: [name] 56 y.o. male returns for followup of Stage II by ISS, kappa multiple myeloma, in remission.  Dx in 09/2014 with 3 g proteinuria (Bence-Jones).  He was evaluated at [facility] for stem cell transplant, but was not felt to be an ideal candidate.  He wishes to continue with Rev/Dex AND Hypogammaglobulinemia AND DM, Stage III CKD, IBS/diarrhea.  HPI Elements MM  Location: Blood  Quality: Kappa  Severity: Stage II by ISS.  Currently in remission  Duration: Dx in 09/2014  Context:   Timing:   Modifying Factors: Morbid obesity, DM, Stage III CKD, IBS/diarrhea, hypogammaglobulinemia  Associated Signs & Symptoms:    He is discussed ongoing treatment options  with his wife.  He wishes to continue with [name] and dexamethasone.  We reviewed the risks, benefits, alternatives, and side effects of this option.  With his diabetes, he knows that when he takes his dexamethasone, his glucose will increase.  He is being proactive and reports being very strict with his hyperglycemia control.  He reports of back pain.  He notes it is in his lower back and radiates to his right side.  He notes that it increases with particular movements.  It is relieved with rest.  He continues to have issues with pedal sores.  He recently had custom shoes made and they will be ready for pickup in the near future.    Review of Systems  Constitutional: Negative.   Negative for chills, fever and weight loss.  HENT: Negative.   Eyes: Negative.   Respiratory: Negative.  Negative for cough.   Cardiovascular: Negative.  Negative for chest pain.  Gastrointestinal: Positive for diarrhea. Negative for blood in stool, constipation, melena, nausea and vomiting.  Genitourinary: Negative.   Musculoskeletal: Positive for back pain.  Skin: Negative.   Neurological: Negative.  Negative for weakness.  Endo/Heme/Allergies: Negative.   Psychiatric/Behavioral: Negative.     Past Medical History:  Diagnosis Date  . Arteriosclerotic cardiovascular disease (ASCVD)    MI-2000s; stent to the proximal LAD and diagonal in 2001; stress nuclear in 2008-impaired exercise capacity, left ventricular dilatation, moderately to severely depressed EF, apical, inferior and anteroseptal scar  . Atrial flutter ([place])   . 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 Cellulitis of leg    both legs  . Chronic diarrhea   . Chronic kidney disease, stage 3, mod decreased GFR    Creatinine of 1.84 in 06/2011 and 1.5 in 07/2011  . Diabetes mellitus    Insulin  . GERD (gastroesophageal reflux disease)   . Gout   . Hyperlipidemia   . Hypertension   . Injection site reaction   . Multiple myeloma 07/01/2011  . Myocardial infarction ([place]) 2000  . Obesity   . Pedal edema    Venous insufficiency  . Sleep apnea    uses cpap  . Ulcer     Past Surgical History:  Procedure Laterality Date  . ABSCESS DRAINAGE     Scrotal  . BIOPSY  01/02/2012   Procedure: BIOPSY;  Surgeon: Rogene Houston, MD;  Location: [facility];  Service: Endoscopy;  Laterality: N/A;  . BONE MARROW BIOPSY  05/13/11  . CARDIAC CATHETERIZATION     cardiac stent  . CARDIOVERSION N/A 10/13/2012   Procedure: CARDIOVERSION;  Surgeon: Yehuda Savannah, MD;  Location: [facility] ORS;  Service: Cardiovascular;  Laterality: N/A;  . CATARACT EXTRACTION W/PHACO Left 02/13/2014   Procedure: CATARACT EXTRACTION PHACO AND INTRAOCULAR LENS  PLACEMENT ([place]);  Surgeon: Tonny Branch, MD;  Location: [facility] ORS;  Service: Ophthalmology;  Laterality: Left;  CDE:  7.67  . CATARACT EXTRACTION W/PHACO Right 03/02/2014   Procedure: CATARACT EXTRACTION PHACO AND INTRAOCULAR LENS PLACEMENT RIGHT EYE CDE=16.81;  Surgeon: Tonny Branch, MD;  Location: [facility] ORS;  Service: Ophthalmology;  Laterality: Right;  . COLONOSCOPY  11/28/2011   Procedure: COLONOSCOPY;  Surgeon: Rogene Houston, MD;  Location: [facility];  Service: Endoscopy;  Laterality: N/A;  930  . ESOPHAGOGASTRODUODENOSCOPY  01/02/2012   Procedure: ESOPHAGOGASTRODUODENOSCOPY (EGD);  Surgeon: Rogene Houston, MD;  Location[facility];  Service: Endoscopy;  Laterality: N/A;  100  . ESOPHAGOGASTRODUODENOSCOPY N/A 09/20/2012   Procedure: ESOPHAGOGASTRODUODENOSCOPY (EGD);  Surgeon: Rogene Houston, MD;  Location: [facility];  Service: Endoscopy;  Laterality: N/A;  . EUS N/A 10/07/2012   Procedure: UPPER ENDOSCOPIC ULTRASOUND (EUS) LINEAR;  Surgeon: Milus Banister, MD;  Location: [facility];  Service: Endoscopy;  Laterality: N/A;  . INCISION AND DRAINAGE ABSCESS ANAL    . LAPAROSCOPIC GASTRIC BANDING  2006   has been removed  . PORT-A-CATH REMOVAL Left 12/07/2012   Procedure: REMOVAL PORT-A-CATH;  Surgeon: Scherry Ran, MD;  Location: [facility] OR[facility];  Service: General;  Laterality: Left;  . PORTACATH PLACEMENT  07/07/2011   Procedure: INSERTION PORT-A-CATH;  Surgeon: Scherry Ran, MD;  Location: [facility] OR[facility];  Service: General;  Laterality: N/A;  . PORTACATH PLACEMENT N/A 12/07/2012   Procedure: INSERTION PORT-A-CATH;  Surgeon: Scherry Ran, MD;  Location: [facility];  Service: General;  Laterality: N/A;  Attempted portacath placement on left and right side  . WRIST SURGERY     Left; removal of bone fragment    Family History  Problem Relation Age of Onset  . Heart disease Mother   . Cancer Mother   . Diabetes Father   . Arthritis Unknown   . Anesthesia problems Neg Hx   . Hypotension Neg Hx     . Malignant hyperthermia Neg Hx   . Pseudochol deficiency Neg Hx     Social History   Social History  . Marital status: Married    Spouse name: N/A  . Number of children: N/A  . Years of education: 73   Occupational History  . Nurse, learning disability   . [employer]   Social History Main Topics  . Smoking status: Former Smoker    Packs/day: 0.25    Years: 1.00    Types: Cigarettes, Cigars    Quit date: 05/17/2001  . Smokeless tobacco: Never Used  . Alcohol use No  . Drug use: No  . Sexual activity: Yes    Birth control/ protection: None   Other Topics Concern  . None   Social History Narrative  . None     PHYSICAL EXAMINATION  ECOG PERFORMANCE STATUS: 1 - Symptomatic but completely ambulatory  Vitals:   10/20/16 1113  BP: (!) 111/53  Pulse: (!) 58  Resp: 18  Temp: 98.4 F (36.9 C)    GENERAL:alert, no distress,  well nourished, well developed, comfortable, cooperative, obese and unaccompanied SKIN: skin color, texture, turgor are normal, no rashes or significant lesions HEAD: Normocephalic, No masses, lesions, tenderness or abnormalities EYES: normal, EOMI EARS: External ears normal OROPHARYNX:lips, buccal mucosa, and tongue normal  NECK: supple, no adenopathy, trachea midline LYMPH:  no palpable lymphadenopathy BREAST:not examined LUNGS: clear to auscultation and percussion HEART: regular rate & rhythm, no murmurs and no gallops ABDOMEN:abdomen soft, non-tender, obese and normal bowel sounds BACK: Back symmetric, no curvature. EXTREMITIES:less then 2 second capillary refill, no joint deformities, effusion, or inflammation, no skin discoloration, no cyanosis, positive findings:  Pedal boot on L.  NEURO: alert & oriented x 3 with fluent speech, no focal motor/sensory deficits, gait normal    LABORATORY DATA: CBC    Component Value Date/Time   WBC 6.7 10/20/2016 0936   RBC 3.21 (L) 10/20/2016 0936   HGB 9.9 (L) 10/20/2016 0936    HCT 30.2 (L) 10/20/2016 0936   PLT 137 (L) 10/20/2016 0936   MCV 94.1 10/20/2016 0936   MCH 30.8 10/20/2016 0936   MCHC 32.8 10/20/2016 0936   RDW 16.5 (H) 10/20/2016 0936   LYMPHSABS 2.0 10/20/2016 0936   MONOABS 0.4 10/20/2016 0936   EOSABS 0.4 10/20/2016 0936   BASOSABS 0.0 10/20/2016 0936      Chemistry      Component Value Date/Time   NA 139 10/20/2016 0936   K 3.8 10/20/2016 0936   CL 114 (H) 10/20/2016 0936   CO2 19 (L) 10/20/2016 0936   BUN 31 (H) 10/20/2016 0936   CREATININE 1.63 (H) 10/20/2016 0936   CREATININE 2.24 (H) 10/05/2012 1411      Component Value Date/Time   CALCIUM 8.2 (L) 10/20/2016 0936   CALCIUM 8.3 (L) 02/07/2016 1521   ALKPHOS 98 10/20/2016 0936   AST 70 (H) 10/20/2016 0936   ALT 82 (H) 10/20/2016 0936   BILITOT 0.4 10/20/2016 0936     Lab Results  Component Value Date   PROT 5.6 (L) 10/20/2016   ALBUMINELP 3.2 08/19/2016   A1GS 0.2 08/19/2016   A2GS 0.7 08/19/2016   BETS 0.8 08/19/2016   BETA2SER 4.9 08/10/2014   GAMS 0.3 (L) 08/19/2016   MSPIKE Not Observed 08/19/2016   SPEI Comment 08/19/2016   SPECOM Comment 08/19/2016   IGGSERUM 305 (L) 08/19/2016   IGGSERUM 303 (L) 08/19/2016   IGA 53 (L) 08/19/2016   IGA 55 (L) 08/19/2016   IGMSERUM 48 08/19/2016   IGMSERUM 49 08/19/2016   IMMELINT (NOTE) 08/10/2014   KPAFRELGTCHN 21.5 (H) 08/19/2016   LAMBDASER 21.3 08/19/2016   KAPLAMBRATIO 1.01 08/19/2016    PENDING LABS:   RADIOGRAPHIC STUDIES:  No results found.   PATHOLOGY:    ASSESSMENT AND PLAN:  Multiple myeloma ([place]) Stage II by ISS, kappa multiple myeloma, in remission.  Dx in 09/2014 with 3 g proteinuria (Bence-Jones).  He was [facility] at [facility] for stem cell transplant, but was not felt to be an ideal candidate.  At time of relapse, I think it would be reasonable for the patient to be re-consulted for consideration of BMT with Dr. Norma Fredrickson at [facility].  Labs today: CBC diff, CMET, SPEP+IFE, light chain assay.  I  personally reviewed and went over laboratory results with the patient.  The results are noted within this dictation.  Anemia and thrombocytopenia are stable. Mild elevation of AST/ALT noted.  Unknown reason at this time. Renal function is stable.  Labs in 3 months: CBC diff, CMET, CRP, LDH, SPEP+IFE,  light chain assay, and B2M.  Ongoing discussion regarding continued Rev/Dex given his DM and other co-morbidities.  He wishes to continue which is reasonable.  He understands the risks associated with ongoing dexamethasone.  He reports a lumbar back pain that radiates the right.  It increases with particular movements.  I recommended ice and anti-inflammatory twice daily 1 week.  He is advised not to take anti-inflammatory past 1 week.  If back pain/muscle spasm persist, I would not hesitate to give a short course of Flexeril.  He is due for a skeletal survey.  Order is placed.  He has GI follow-up tomorrow with Dr. Laural Golden.  He continues to have issues with diarrhea.  Return in 3 months for follow-up.    ORDERS PLACED FOR THIS ENCOUNTER: Orders Placed This Encounter  Procedures  . CBC with Differential  . Comprehensive metabolic panel  . Lactate dehydrogenase  . C-reactive protein  . Kappa/lambda light chains  . Beta 2 microglobuline, serum  . IgG, IgA, IgM  . Immunofixation electrophoresis  . Protein electrophoresis, serum  . Schedule Portacath Flush Appointment    MEDICATIONS PRESCRIBED THIS ENCOUNTER: Meds ordered this encounter  Medications  . heparin lock flush 100 unit/mL  . sodium chloride flush (NS) 0.9 % injection 10 mL    THERAPY PLAN:  Continue with Rev/Dex in the maintenance setting.  All questions were answered. The patient knows to call the clinic with any problems, questions or concerns. We can certainly see the patient much sooner if necessary.  Patient and plan discussed with Dr. Twana First and she is in agreement with the aforementioned.   This note is  electronically signed by: Doy Mince 10/20/2016 2:58 PM

## 2016-10-21 ENCOUNTER — Encounter (INDEPENDENT_AMBULATORY_CARE_PROVIDER_SITE_OTHER): Payer: Self-pay | Admitting: Internal Medicine

## 2016-10-21 ENCOUNTER — Ambulatory Visit (INDEPENDENT_AMBULATORY_CARE_PROVIDER_SITE_OTHER): Payer: BLUE CROSS/BLUE SHIELD | Admitting: Internal Medicine

## 2016-10-21 ENCOUNTER — Other Ambulatory Visit (INDEPENDENT_AMBULATORY_CARE_PROVIDER_SITE_OTHER): Payer: Self-pay | Admitting: *Deleted

## 2016-10-21 VITALS — BP 130/80 | HR 70 | Temp 98.1°F | Resp 18 | Ht 72.0 in | Wt 371.4 lb

## 2016-10-21 DIAGNOSIS — K219 Gastro-esophageal reflux disease without esophagitis: Secondary | ICD-10-CM | POA: Diagnosis not present

## 2016-10-21 DIAGNOSIS — K580 Irritable bowel syndrome with diarrhea: Secondary | ICD-10-CM

## 2016-10-21 DIAGNOSIS — R7989 Other specified abnormal findings of blood chemistry: Secondary | ICD-10-CM

## 2016-10-21 DIAGNOSIS — R197 Diarrhea, unspecified: Secondary | ICD-10-CM

## 2016-10-21 DIAGNOSIS — K903 Pancreatic steatorrhea: Secondary | ICD-10-CM | POA: Diagnosis not present

## 2016-10-21 DIAGNOSIS — R945 Abnormal results of liver function studies: Secondary | ICD-10-CM

## 2016-10-21 LAB — PROTEIN ELECTROPHORESIS, SERUM
A/G Ratio: 1.4 (ref 0.7–1.7)
Albumin ELP: 2.8 g/dL — ABNORMAL LOW (ref 2.9–4.4)
Alpha-1-Globulin: 0.2 g/dL (ref 0.0–0.4)
Alpha-2-Globulin: 0.6 g/dL (ref 0.4–1.0)
Beta Globulin: 0.9 g/dL (ref 0.7–1.3)
Gamma Globulin: 0.3 g/dL — ABNORMAL LOW (ref 0.4–1.8)
Globulin, Total: 2 g/dL — ABNORMAL LOW (ref 2.2–3.9)
Total Protein ELP: 4.8 g/dL — ABNORMAL LOW (ref 6.0–8.5)

## 2016-10-21 LAB — IGG, IGA, IGM
IgA: 55 mg/dL — ABNORMAL LOW (ref 90–386)
IgG (Immunoglobin G), Serum: 376 mg/dL — ABNORMAL LOW (ref 700–1600)
IgM, Serum: 48 mg/dL (ref 20–172)

## 2016-10-21 MED ORDER — DIPHENOXYLATE-ATROPINE 2.5-0.025 MG PO TABS: ORAL_TABLET | ORAL | 5 refills | Status: DC

## 2016-10-21 MED ORDER — CODEINE SULFATE 30 MG PO TABS: 30.0000 mg | ORAL_TABLET | Freq: Two times a day (BID) | ORAL | 0 refills | Status: DC

## 2016-10-21 NOTE — Patient Instructions (Signed)
Stool diary for the next 2 weeks as to frequency and consistency of stools. Physician will call with results of stool test.

## 2016-10-21 NOTE — Progress Notes (Signed)
Presenting complaint;  Follow-up for steatorrhea.  Subjective:  Patient is 56 year old African-American male who is here for scheduled visit. He was last seen on 06/24/2016. He has gained 6 pounds since his last visit. He states he has changed his diet. He made this change about 2 months ago. He continues to complain of diarrhea. He has 4-6 stools per day. Most of his stools are mushy and liquids. He may have occasional formed stool. He had one such episode 1 month ago when he had 3 formed stools over 2 day. And he remembers he was taking hydrocodone for pain. He is not doing much physical activity on a on account of foot problem. He continues to complain of intermittent gas pain across upper abdomen which lasts more than 45 minutes. This pain is not associated with meals. Heartburn is well controlled with PPI. He cannot tell if cholestyramine is helping.    Current Medications: Outpatient Encounter Prescriptions as of 10/21/2016  Medication Sig  . acyclovir (ZOVIRAX) 400 MG tablet TAKE ONE TABLET BY MOUTH EVERY MORNING.  Marland Kitchen allopurinol (ZYLOPRIM) 300 MG tablet TAKE ONE TABLET BY MOUTH ONCE DAILY.  Marland Kitchen atorvastatin (LIPITOR) 80 MG tablet TAKE ONE TABLET BY MOUTH AT BEDTIME.  . calcitRIOL (ROCALTROL) 0.25 MCG capsule Take 0.25 mcg by mouth 3 (three) times a week. Monday, Wednesday, Friday.  . calcium carbonate (OS-CAL) 600 MG TABS tablet Take 1,800 mg by mouth 2 (two) times daily.   . cefpodoxime (VANTIN) 200 MG tablet Take 1 tablet (200 mg total) by mouth 2 (two) times daily.  . cholestyramine (QUESTRAN) 4 g packet Take 1 packet (4 g total) by mouth 2 (two) times daily.  Marland Kitchen dexamethasone (DECADRON) 4 MG tablet TAKE 5 TABLETS BY MOUTH 2 TIMES A DAY EVERY FRIDAY.  Marland Kitchen dexlansoprazole (DEXILANT) 60 MG capsule Take 1 capsule (60 mg total) by mouth daily.  . diphenoxylate-atropine (LOMOTIL) 2.5-0.025 MG tablet TAKE (2) TABLETS BY MOUTH TWICE DAILY.  Marland Kitchen HYDROcodone-acetaminophen (NORCO/VICODIN) 5-325 MG  tablet Take 1-2 tablets by mouth every 4 (four) hours as needed for moderate pain or severe pain.  Marland Kitchen insulin detemir (LEVEMIR) 100 UNIT/ML injection Inject 40 Units into the skin every morning.   . insulin lispro (HUMALOG) 100 UNIT/ML injection Inject 5-30 Units into the skin 2 (two) times daily with a meal. Sliding Scale per patient  . lenalidomide ([name]) 5 MG capsule TAKE 1 CAPSULE BY MOUTH DAILY FOR 7 DAYS ON FOLLOWED BY 7 DAYS OFF.  Marland Kitchen lisinopril (PRINIVIL,ZESTRIL) 5 MG tablet Take 5 mg by mouth every other day.  . loratadine (CLARITIN) 10 MG tablet Take 10 mg by mouth every morning.   . metoprolol succinate (TOPROL-XL) 25 MG 24 hr tablet TAKE ONE TABLET BY MOUTH ONCE DAILY.  . Multiple Vitamins-Minerals (MULTIVITAMINS THER. W/MINERALS) TABS Take 1 tablet by mouth daily.   . nitroGLYCERIN (NITROSTAT) 0.4 MG SL tablet Place 1 tablet (0.4 mg total) under the tongue every 5 (five) minutes as needed for chest pain.  . Pancrelipase, Lip-Prot-Amyl, (ZENPEP PO) Take by mouth. Patient is taking 4 with a meal and 2 with a snack.  . potassium chloride SA (K-DUR,KLOR-CON) 20 MEQ tablet TAKE 2 TABLETS BY MOUTH TWICE DAILY.  . sodium bicarbonate 650 MG tablet Take 1,300 mg by mouth 3 (three) times daily.  Marland Kitchen sulfamethoxazole-trimethoprim (BACTRIM DS,SEPTRA DS) 800-160 MG tablet TAKE ONE TABLET BY MOUTH EVERY MONDAY, WEDNESDAY, AND FRIDAY.  Marland Kitchen torsemide (DEMADEX) 20 MG tablet TAKE 2 TABLETS BY MOUTH EACH MORNING AND 1 TABLET  EACH EVENING.  . Vitamin D, Ergocalciferol, (DRISDOL) 50000 units CAPS capsule TAKE 1 CAPSULE BY MOUTH EVERY THIRTY DAYS.  Marland Kitchen XARELTO 20 MG TABS tablet TAKE 1 TABLET BY MOUTH DAILY.  Marland Kitchen lipase/protease/amylase (CREON) 36000 UNITS CPEP capsule TAKE 5 CAPSULES BY MOUTH DAILY WITH MEALS AND 2 CAPSULES WITH SNACKS. (Patient not taking: Reported on 10/21/2016)   Facility-Administered Encounter Medications as of 10/21/2016  Medication  . sodium chloride 0.9 % injection 10 mL      Objective: Blood pressure 130/80, pulse 70, temperature 98.1 F (36.7 C), temperature source Oral, resp. rate 18, height 6' (1.829 m), weight (!) 371 lb 6.4 oz (168.5 kg). Patient is alert and in no acute distress. Conjunctiva is pink. Sclera is nonicteric Oropharyngeal mucosa is normal. No neck masses or thyromegaly noted. Cardiac exam with regular rhythm normal S1 and S2. No murmur or gallop noted. Lungs are clear to auscultation. Abdomen is obese. Bowel sounds are normal. On palpation abdomen is soft with mild midepigastric tenderness. No organomegaly or masses. He has 2+ pitting edema to both legs.  Labs/studies Results:   Recent Labs  10/20/16 0936  WBC 6.7  HGB 9.9*  HCT 30.2*  PLT 137*    BMET   Recent Labs  10/20/16 0936  NA 139  K 3.8  CL 114*  CO2 19*  GLUCOSE 162*  BUN 31*  CREATININE 1.63*  CALCIUM 8.2*    LFT   Recent Labs  10/20/16 0936  PROT 5.6*  ALBUMIN 3.1*  AST 70*  ALT 82*  ALKPHOS 98  BILITOT 0.4     Assessment:  #1. Steatorrhea felt to be due to pancreatic insufficiency. He has had unsatisfactory response to therapy. However he is not losing weight. He has had better results with Creon but his insurance would not cover. He is still having 4-6 stools per day and most of his stools are semi-formed to loose. Will stop cholestyramine and diphenoxylate and try codeine to see if stool frequency can be decreased. #2. Irritable bowel syndrome. He has typical symptoms which contribution to his diarrhea and stool frequency. #3. GERD. Dexilant seem to work best for him. We will try to get it approved from his insurance company. #4. Elevated transaminases felt to be secondary to fatty liver. #5. Asymptomatic cholelithiasis. #6. Anemia appears to be due to chronic disease. He has history of multiple myeloma and is on maintenance therapy.  Plan:  Discontinue cholestyramine and diphenoxylate. Codeine 30 mg by mouth twice a day. Patient  will call with progress report in 3 months. If codeine works will give new prescription. Office visit in 3 months.

## 2016-10-22 LAB — IMMUNOFIXATION ELECTROPHORESIS
IgA: 58 mg/dL — ABNORMAL LOW (ref 90–386)
IgG (Immunoglobin G), Serum: 327 mg/dL — ABNORMAL LOW (ref 700–1600)
IgM, Serum: 45 mg/dL (ref 20–172)
Total Protein ELP: 5 g/dL — ABNORMAL LOW (ref 6.0–8.5)

## 2016-10-29 ENCOUNTER — Other Ambulatory Visit (HOSPITAL_COMMUNITY): Payer: Self-pay | Admitting: Oncology

## 2016-10-29 ENCOUNTER — Other Ambulatory Visit: Payer: Self-pay | Admitting: Cardiovascular Disease

## 2016-10-30 ENCOUNTER — Encounter (INDEPENDENT_AMBULATORY_CARE_PROVIDER_SITE_OTHER): Payer: Self-pay | Admitting: Internal Medicine

## 2016-11-03 ENCOUNTER — Other Ambulatory Visit (HOSPITAL_COMMUNITY): Payer: Self-pay | Admitting: Emergency Medicine

## 2016-11-03 DIAGNOSIS — C9001 Multiple myeloma in remission: Secondary | ICD-10-CM

## 2016-11-03 MED ORDER — LENALIDOMIDE 5 MG PO CAPS: ORAL_CAPSULE | ORAL | 0 refills | Status: DC

## 2016-11-10 ENCOUNTER — Telehealth (INDEPENDENT_AMBULATORY_CARE_PROVIDER_SITE_OTHER): Payer: Self-pay | Admitting: Internal Medicine

## 2016-11-10 NOTE — Telephone Encounter (Signed)
Patient was called and made aware that we had samples of Dexilant for him to pick up. He plans to come by today and pick those up.

## 2016-11-10 NOTE — Telephone Encounter (Signed)
Patient stated that he needs some Dexilant samples.  Stated that he's out.  He stated that he's starting to have a bad feeling in his chest and when he runs out that is what happens.  [phone]

## 2016-11-11 ENCOUNTER — Other Ambulatory Visit (INDEPENDENT_AMBULATORY_CARE_PROVIDER_SITE_OTHER): Payer: Self-pay | Admitting: Internal Medicine

## 2016-11-11 ENCOUNTER — Other Ambulatory Visit (HOSPITAL_COMMUNITY): Payer: Self-pay | Admitting: Oncology

## 2016-11-14 LAB — FECAL FAT, QUALITATIVE

## 2016-11-19 ENCOUNTER — Other Ambulatory Visit (HOSPITAL_COMMUNITY): Payer: Self-pay | Admitting: Podiatry

## 2016-11-19 DIAGNOSIS — E1342 Other specified diabetes mellitus with diabetic polyneuropathy: Secondary | ICD-10-CM

## 2016-11-19 DIAGNOSIS — L97529 Non-pressure chronic ulcer of other part of left foot with unspecified severity: Secondary | ICD-10-CM

## 2016-11-19 DIAGNOSIS — L97519 Non-pressure chronic ulcer of other part of right foot with unspecified severity: Secondary | ICD-10-CM

## 2016-11-24 ENCOUNTER — Ambulatory Visit (HOSPITAL_COMMUNITY)
Admission: RE | Admit: 2016-11-24 | Discharge: 2016-11-24 | Disposition: A | Discharge status: Home / Self Care | Payer: BLUE CROSS/BLUE SHIELD | Source: Ambulatory Visit | Attending: Oncology | Admitting: Oncology

## 2016-11-24 ENCOUNTER — Other Ambulatory Visit (HOSPITAL_COMMUNITY): Payer: Self-pay | Admitting: Emergency Medicine

## 2016-11-24 ENCOUNTER — Ambulatory Visit (HOSPITAL_COMMUNITY)
Admission: RE | Admit: 2016-11-24 | Discharge: 2016-11-24 | Disposition: A | Discharge status: Home / Self Care | Payer: BLUE CROSS/BLUE SHIELD | Source: Ambulatory Visit | Attending: Podiatry | Admitting: Podiatry

## 2016-11-24 DIAGNOSIS — I6529 Occlusion and stenosis of unspecified carotid artery: Secondary | ICD-10-CM | POA: Diagnosis not present

## 2016-11-24 DIAGNOSIS — L97529 Non-pressure chronic ulcer of other part of left foot with unspecified severity: Secondary | ICD-10-CM

## 2016-11-24 DIAGNOSIS — M47814 Spondylosis without myelopathy or radiculopathy, thoracic region: Secondary | ICD-10-CM | POA: Insufficient documentation

## 2016-11-24 DIAGNOSIS — C9000 Multiple myeloma not having achieved remission: Secondary | ICD-10-CM | POA: Diagnosis present

## 2016-11-24 DIAGNOSIS — L97519 Non-pressure chronic ulcer of other part of right foot with unspecified severity: Secondary | ICD-10-CM

## 2016-11-24 DIAGNOSIS — M24011 Loose body in right shoulder: Secondary | ICD-10-CM | POA: Diagnosis not present

## 2016-11-24 DIAGNOSIS — L97512 Non-pressure chronic ulcer of other part of right foot with fat layer exposed: Secondary | ICD-10-CM | POA: Insufficient documentation

## 2016-11-24 DIAGNOSIS — M47812 Spondylosis without myelopathy or radiculopathy, cervical region: Secondary | ICD-10-CM | POA: Insufficient documentation

## 2016-11-24 DIAGNOSIS — E1342 Other specified diabetes mellitus with diabetic polyneuropathy: Secondary | ICD-10-CM

## 2016-11-24 DIAGNOSIS — L97521 Non-pressure chronic ulcer of other part of left foot limited to breakdown of skin: Secondary | ICD-10-CM | POA: Insufficient documentation

## 2016-11-24 DIAGNOSIS — K449 Diaphragmatic hernia without obstruction or gangrene: Secondary | ICD-10-CM | POA: Diagnosis not present

## 2016-11-24 DIAGNOSIS — E1142 Type 2 diabetes mellitus with diabetic polyneuropathy: Secondary | ICD-10-CM | POA: Diagnosis not present

## 2016-11-24 DIAGNOSIS — M47816 Spondylosis without myelopathy or radiculopathy, lumbar region: Secondary | ICD-10-CM | POA: Diagnosis not present

## 2016-11-24 DIAGNOSIS — I70203 Unspecified atherosclerosis of native arteries of extremities, bilateral legs: Secondary | ICD-10-CM | POA: Insufficient documentation

## 2016-11-27 ENCOUNTER — Other Ambulatory Visit (HOSPITAL_COMMUNITY): Payer: Self-pay | Admitting: Emergency Medicine

## 2016-11-27 DIAGNOSIS — C9001 Multiple myeloma in remission: Secondary | ICD-10-CM

## 2016-11-27 MED ORDER — LENALIDOMIDE 5 MG PO CAPS: ORAL_CAPSULE | ORAL | 0 refills | Status: DC

## 2016-11-27 NOTE — Progress Notes (Signed)
revlimid refilled

## 2016-12-05 ENCOUNTER — Emergency Department (HOSPITAL_COMMUNITY)
Admission: EM | Admit: 2016-12-05 | Discharge: 2016-12-05 | Disposition: A | Discharge status: Home / Self Care | Payer: BLUE CROSS/BLUE SHIELD | Attending: Emergency Medicine | Admitting: Emergency Medicine

## 2016-12-05 ENCOUNTER — Encounter (HOSPITAL_COMMUNITY): Payer: Self-pay | Admitting: Emergency Medicine

## 2016-12-05 DIAGNOSIS — I2510 Atherosclerotic heart disease of native coronary artery without angina pectoris: Secondary | ICD-10-CM | POA: Diagnosis not present

## 2016-12-05 DIAGNOSIS — I129 Hypertensive chronic kidney disease with stage 1 through stage 4 chronic kidney disease, or unspecified chronic kidney disease: Secondary | ICD-10-CM | POA: Diagnosis not present

## 2016-12-05 DIAGNOSIS — Z794 Long term (current) use of insulin: Secondary | ICD-10-CM | POA: Diagnosis not present

## 2016-12-05 DIAGNOSIS — N183 Chronic kidney disease, stage 3 (moderate): Secondary | ICD-10-CM | POA: Diagnosis not present

## 2016-12-05 DIAGNOSIS — Z7901 Long term (current) use of anticoagulants: Secondary | ICD-10-CM | POA: Diagnosis not present

## 2016-12-05 DIAGNOSIS — Z87891 Personal history of nicotine dependence: Secondary | ICD-10-CM | POA: Insufficient documentation

## 2016-12-05 DIAGNOSIS — M25562 Pain in left knee: Secondary | ICD-10-CM | POA: Diagnosis present

## 2016-12-05 DIAGNOSIS — M109 Gout, unspecified: Secondary | ICD-10-CM | POA: Diagnosis not present

## 2016-12-05 DIAGNOSIS — E1122 Type 2 diabetes mellitus with diabetic chronic kidney disease: Secondary | ICD-10-CM | POA: Diagnosis not present

## 2016-12-05 DIAGNOSIS — Z79899 Other long term (current) drug therapy: Secondary | ICD-10-CM | POA: Insufficient documentation

## 2016-12-05 LAB — BASIC METABOLIC PANEL
Anion gap: 8 (ref 5–15)
BUN: 33 mg/dL — ABNORMAL HIGH (ref 6–20)
CO2: 21 mmol/L — ABNORMAL LOW (ref 22–32)
Calcium: 8.8 mg/dL — ABNORMAL LOW (ref 8.9–10.3)
Chloride: 107 mmol/L (ref 101–111)
Creatinine, Ser: 1.94 mg/dL — ABNORMAL HIGH (ref 0.61–1.24)
GFR calc Af Amer: 42 mL/min — ABNORMAL LOW (ref >60)
GFR calc non Af Amer: 37 mL/min — ABNORMAL LOW (ref >60)
Glucose, Bld: 313 mg/dL — ABNORMAL HIGH (ref 65–99)
Potassium: 4.3 mmol/L (ref 3.5–5.1)
Sodium: 136 mmol/L (ref 135–145)

## 2016-12-05 LAB — CBC WITH DIFFERENTIAL/PLATELET
Basophils Absolute: 0 10*3/uL (ref 0.0–0.1)
Basophils Relative: 0 %
Eosinophils Absolute: 0 10*3/uL (ref 0.0–0.7)
Eosinophils Relative: 0 %
HCT: 31.1 % — ABNORMAL LOW (ref 39.0–52.0)
Hemoglobin: 10.2 g/dL — ABNORMAL LOW (ref 13.0–17.0)
Lymphocytes Relative: 9 %
Lymphs Abs: 0.8 10*3/uL (ref 0.7–4.0)
MCH: 30.3 pg (ref 26.0–34.0)
MCHC: 32.8 g/dL (ref 30.0–36.0)
MCV: 92.3 fL (ref 78.0–100.0)
Monocytes Absolute: 0.1 10*3/uL (ref 0.1–1.0)
Monocytes Relative: 2 %
Neutro Abs: 7.6 10*3/uL (ref 1.7–7.7)
Neutrophils Relative %: 89 %
Platelets: 161 10*3/uL (ref 150–400)
RBC: 3.37 MIL/uL — ABNORMAL LOW (ref 4.22–5.81)
RDW: 16.7 % — ABNORMAL HIGH (ref 11.5–15.5)
WBC: 8.5 10*3/uL (ref 4.0–10.5)

## 2016-12-05 MED ORDER — COLCHICINE 0.6 MG PO TABS: 0.6000 mg | ORAL_TABLET | Freq: Every day | ORAL | 0 refills | Status: DC

## 2016-12-05 MED ORDER — ONDANSETRON HCL 4 MG/2ML IJ SOLN
4.0000 mg | Freq: Once | INTRAMUSCULAR | Status: AC
  Administered 2016-12-05: 4 mg via INTRAVENOUS
  Filled 2016-12-05: qty 2

## 2016-12-05 MED ORDER — COLCHICINE 0.6 MG PO TABS: 0.6000 mg | ORAL_TABLET | Freq: Two times a day (BID) | ORAL | 0 refills | Status: DC

## 2016-12-05 MED ORDER — MORPHINE SULFATE (PF) 4 MG/ML IV SOLN
4.0000 mg | INTRAVENOUS | Status: DC | PRN
  Administered 2016-12-05 (×2): 4 mg via INTRAVENOUS
  Filled 2016-12-05 (×2): qty 1

## 2016-12-05 MED ORDER — PREDNISONE 20 MG PO TABS: 20.0000 mg | ORAL_TABLET | Freq: Every day | ORAL | 0 refills | Status: DC

## 2016-12-05 MED ORDER — OXYCODONE-ACETAMINOPHEN 5-325 MG PO TABS: 2.0000 | ORAL_TABLET | ORAL | 0 refills | Status: DC | PRN

## 2016-12-05 MED ORDER — COLCHICINE 0.6 MG PO TABS
0.6000 mg | ORAL_TABLET | Freq: Once | ORAL | Status: AC
  Administered 2016-12-05: 0.6 mg via ORAL
  Filled 2016-12-05: qty 1

## 2016-12-05 NOTE — ED Triage Notes (Signed)
Patient complaining of left knee pain and swelling x 2 days. Denies injury.

## 2016-12-05 NOTE — ED Notes (Signed)
Assisted pt in bed, painful left knee, edema bilaterally, EDP at the bedside

## 2016-12-05 NOTE — ED Notes (Signed)
Patient given discharge instruction, verbalized understand. IV removed, band aid applied. Patient ambulatory out of the department.  

## 2016-12-05 NOTE — ED Provider Notes (Signed)
[facility] DEPT Provider Note   CSN: [id number] Arrival date & time: 12/05/16  1139     History   Chief Complaint Chief Complaint  Patient presents with  . Knee Pain    HPI [name] is a 56 y.o. male. She complains left knee pain  HPI  56 year old male. History of multiple myeloma. He is getting Decadron twice a day every Friday. For the last 4 days he's had pain and swelling in his left knee. He has a history of gout in his foot Mnire's ago. He's had no fever. No injury. The knee is painful, but not red or hot touch.  No systemic symptoms of fever or other acute medical issues  Patient is diabetic. He states when he does take his Decadron on Fridays for myeloma does bump his sugars "a little bit". He has seen a kidney doctor in the past that he does not recall a reference to his stage of renal function. Past Medical History:  Diagnosis Date  . Arteriosclerotic cardiovascular disease (ASCVD)    MI-2000s; stent to the proximal LAD and diagonal in 2001; stress nuclear in 2008-impaired exercise capacity, left ventricular dilatation, moderately to severely depressed EF, apical, inferior and anteroseptal scar  . Atrial flutter ([place])   . 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 Cellulitis of leg    both legs  . Chronic diarrhea   . Chronic kidney disease, stage 3, mod decreased GFR    Creatinine of 1.84 in 06/2011 and 1.5 in 07/2011  . Diabetes mellitus    Insulin  . GERD (gastroesophageal reflux disease)   . Gout   . Hyperlipidemia   . Hypertension   . Injection site reaction   . Multiple myeloma 07/01/2011  . Myocardial infarction ([place]) 2000  . Obesity   . Pedal edema    Venous insufficiency  . Sleep apnea    uses cpap  . Ulcer     Patient Active Problem List   Diagnosis Date Noted  . Pneumonia 06/10/2016  . CAP (community acquired pneumonia) 06/10/2016  . Cellulitis of left lower extremity   . Hypogammaglobulinemia ([place]) 03/09/2016  . Hypokalemia  03/09/2016  . Diaphragmatic hernia without obstruction 02/26/2016  . Chronic systolic heart failure ([place]) 12/19/2013  . Anemia, normocytic normochromic 10/09/2012  . Chronic pancreatitis ([place]) 10/07/2012  . Atrial flutter ([place]) 09/17/2012  . DDD (degenerative disc disease), cervical 03/18/2012  . Arteriosclerotic cardiovascular disease (ASCVD)   . Hypertension   . Hyperlipidemia   . Multiple myeloma ([place]) 07/01/2011  . Morbid obesity ([place]) 04/29/2010  . Insulin dependent diabetes mellitus ([place]) 11/15/2008  . Sleep apnea 11/15/2008    Past Surgical History:  Procedure Laterality Date  . ABSCESS DRAINAGE     Scrotal  . BIOPSY  01/02/2012   Procedure: BIOPSY;  Surgeon: Rogene [place], MD;  Location[facility];  Service: Endoscopy;  Laterality: N/A;  . BONE MARROW BIOPSY  05/13/11  . CARDIAC CATHETERIZATION     cardiac stent  . CARDIOVERSION N/A 10/13/2012   Procedure: CARDIOVERSION;  Surgeon: Yehuda Savannah, MD;  Location: [facility];  Service: Cardiovascular;  Laterality: N/A;  . CATARACT EXTRACTION W/PHACO Left 02/13/2014   Procedure: CATARACT EXTRACTION PHACO AND INTRAOCULAR LENS PLACEMENT ([place]);  Surgeon: Tonny Branch, MD;  Location: [facility];  Service: Ophthalmology;  Laterality: Left;  CDE:  7.67  . CATARACT EXTRACTION W/PHACO Right 03/02/2014   Procedure: CATARACT EXTRACTION PHACO AND INTRAOCULAR LENS PLACEMENT RIGHT EYE CDE=16.81;  Surgeon: Tonny Branch, MD;  Location: [facility];  Service: Ophthalmology;  Laterality: Right;  . COLONOSCOPY  11/28/2011   Procedure: COLONOSCOPY;  Surgeon: Rogene Houston, MD;  [facility];  Service: Endoscopy;  Laterality: N/A;  930  . ESOPHAGOGASTRODUODENOSCOPY  01/02/2012   Procedure: ESOPHAGOGASTRODUODENOSCOPY (EGD);  Surgeon: Rogene Houston, MD;  [facility];  Service: Endoscopy;  Laterality: N/A;  100  . ESOPHAGOGASTRODUODENOSCOPY N/A 09/20/2012   Procedure: ESOPHAGOGASTRODUODENOSCOPY (EGD);  Surgeon: Rogene [place], MD;   [facility];  Service: Endoscopy;  Laterality: N/A;  . EUS N/A 10/07/2012   Procedure: UPPER ENDOSCOPIC ULTRASOUND (EUS) LINEAR;  Surgeon: Milus Banister, MD;  Location: [facility];  Service: Endoscopy;  Laterality: N/A;  . INCISION AND DRAINAGE ABSCESS ANAL    . LAPAROSCOPIC GASTRIC BANDING  2006   has been removed  . PORT-A-CATH REMOVAL Left 12/07/2012   Procedure: REMOVAL PORT-A-CATH;  Surgeon: Scherry Ran, MD;  Location: [facility];  Service: General;  Laterality: Left;  . PORTACATH PLACEMENT  07/07/2011   Procedure: INSERTION PORT-A-CATH;  Surgeon: Scherry Ran, MD;  Location: [facility];  Service: General;  Laterality: N/A;  . PORTACATH PLACEMENT N/A 12/07/2012   Procedure: INSERTION PORT-A-CATH;  Surgeon: Scherry Ran, MD;  Location: [facility];  Service: General;  Laterality: N/A;  Attempted portacath placement on left and right side  . WRIST SURGERY     Left; removal of bone fragment       Home Medications    Prior to Admission medications   Medication Sig Start Date End Date Taking? Authorizing Provider  acyclovir (ZOVIRAX) 400 MG tablet TAKE ONE TABLET BY MOUTH EVERY MORNING. 03/02/16   Baird Cancer, PA-C  allopurinol (ZYLOPRIM) 300 MG tablet TAKE ONE TABLET BY MOUTH ONCE DAILY. 10/29/16   Baird Cancer, PA-C  atorvastatin (LIPITOR) 80 MG tablet TAKE ONE TABLET BY MOUTH AT BEDTIME. 04/14/16   Herminio Commons, MD  calcitRIOL (ROCALTROL) 0.25 MCG capsule Take 0.25 mcg by mouth 3 (three) times a week. Monday, Wednesday, Friday. 12/14/13   [provider]  calcium carbonate (OS-CAL) 600 MG TABS tablet Take 1,800 mg by mouth 2 (two) times daily.     [provider]  cefpodoxime (VANTIN) 200 MG tablet Take 1 tablet (200 mg total) by mouth 2 (two) times daily. 06/12/16   Thurnell Lose, MD  codeine 30 MG tablet Take 1 tablet (30 mg total) by mouth 2 (two) times daily. 10/21/16   Rogene Houston, MD  colchicine 0.6 MG tablet Take 1 tablet  (0.6 mg total) by mouth daily. 12/05/16   Tanna Furry, MD  dexamethasone (DECADRON) 4 MG tablet TAKE 5 TABLETS BY MOUTH 2 TIMES A DAY EVERY FRIDAY. 10/01/16   Baird Cancer, PA-C  dexlansoprazole (DEXILANT) 60 MG capsule Take 1 capsule (60 mg total) by mouth daily. 07/08/16   Rehman, Mechele Dawley, MD  insulin detemir (LEVEMIR) 100 UNIT/ML injection Inject 40 Units into the skin every morning.  09/08/12   Kathie Dike, MD  insulin lispro (HUMALOG) 100 UNIT/ML injection Inject 5-30 Units into the skin 2 (two) times daily with a meal. Sliding Scale per patient    [provider]  lenalidomide ([name]) 5 MG capsule TAKE 1 CAPSULE BY MOUTH DAILY FOR 7 DAYS ON FOLLOWED BY 7 DAYS OFF. 11/27/16   Twana First, MD  lisinopril (PRINIVIL,ZESTRIL) 5 MG tablet Take 5 mg by mouth every other day. 12/14/13   [provider]  loratadine (CLARITIN)  10 MG tablet Take 10 mg by mouth every morning.     [provider]  metoprolol succinate (TOPROL-XL) 25 MG 24 hr tablet TAKE ONE TABLET BY MOUTH ONCE DAILY. 10/29/16   Herminio Commons, MD  Multiple Vitamins-Minerals (MULTIVITAMINS THER. W/MINERALS) TABS Take 1 tablet by mouth daily.     [provider]  nitroGLYCERIN (NITROSTAT) 0.4 MG SL tablet Place 1 tablet (0.4 mg total) under the tongue every 5 (five) minutes as needed for chest pain. 03/16/15   Penland, Kelby Fam, MD  oxyCODONE-acetaminophen (PERCOCET/ROXICET) 5-325 MG tablet Take 2 tablets by mouth every 4 (four) hours as needed. 12/05/16   Tanna Furry, MD  Pancrelipase, Lip-Prot-Amyl, (ZENPEP PO) Take by mouth. Patient is taking 4 with a meal and 2 with a snack.    [provider]  potassium chloride SA (K-DUR,KLOR-CON) 20 MEQ tablet TAKE 2 TABLETS BY MOUTH TWICE DAILY. 11/12/16   Baird Cancer, PA-C  predniSONE (DELTASONE) 20 MG tablet Take 1 tablet (20 mg total) by mouth daily with breakfast. 12/05/16   Tanna Furry, MD  sodium bicarbonate 650 MG tablet Take 1,300 mg by  mouth 3 (three) times daily.    [provider]  sulfamethoxazole-trimethoprim (BACTRIM DS,SEPTRA DS) 800-160 MG tablet TAKE ONE TABLET BY MOUTH EVERY MONDAY, WEDNESDAY, AND FRIDAY. 10/01/16   Baird Cancer, PA-C  torsemide (DEMADEX) 20 MG tablet TAKE 2 TABLETS BY MOUTH EACH MORNING AND 1 TABLET EACH EVENING. 04/22/16   Setzer, Terri L, NP  Vitamin D, Ergocalciferol, (DRISDOL) 50000 units CAPS capsule TAKE 1 CAPSULE BY MOUTH EVERY THIRTY DAYS. 03/31/16   Baird Cancer, PA-C  XARELTO 20 MG TABS tablet TAKE 1 TABLET BY MOUTH DAILY. 09/02/16   Herminio Commons, MD    Family History Family History  Problem Relation Age of Onset  . Heart disease Mother   . Cancer Mother   . Diabetes Father   . Arthritis Unknown   . Anesthesia problems Neg Hx   . Hypotension Neg Hx   . Malignant hyperthermia Neg Hx   . Pseudochol deficiency Neg Hx     Social History Social History  Substance Use Topics  . Smoking status: Former Smoker    Packs/day: 0.25    Years: 1.00    Types: Cigarettes, Cigars    Quit date: 05/17/2001  . Smokeless tobacco: Never Used  . Alcohol use No     Allergies   Patient has no known allergies.   Review of Systems Review of Systems  Constitutional: Negative for appetite change, chills, diaphoresis, fatigue and fever.  HENT: Negative for mouth sores, sore throat and trouble swallowing.   Eyes: Negative for visual disturbance.  Respiratory: Negative for cough, chest tightness, shortness of breath and wheezing.   Cardiovascular: Negative for chest pain.  Gastrointestinal: Negative for abdominal distention, abdominal pain, diarrhea, nausea and vomiting.  Endocrine: Negative for polydipsia, polyphagia and polyuria.  Genitourinary: Negative for dysuria, frequency and hematuria.  Musculoskeletal: Positive for arthralgias and gait problem.  Skin: Negative for color change, pallor and rash.  Neurological: Negative for dizziness, syncope, light-headedness and  headaches.  Hematological: Does not bruise/bleed easily.  Psychiatric/Behavioral: Negative for behavioral problems and confusion.     Physical Exam Updated Vital Signs BP (!) 115/52 (BP Location: Right Arm)   Pulse 62   Temp 98 F (36.7 C) (Oral)   Resp (!) 22   Ht _0  (1.905 m)   Wt (!) 165.6 kg (365 lb)   SpO2  97%   BMI 45.62 kg/m   Physical Exam  Constitutional: He is oriented to person, place, and time. He appears well-developed and well-nourished. No distress.  HENT:  Head: Normocephalic.  Eyes: Conjunctivae are normal. Pupils are equal, round, and reactive to light. No scleral icterus.  Neck: Normal range of motion. Neck supple. No thyromegaly present.  Cardiovascular: Normal rate and regular rhythm.  Exam reveals no gallop and no friction rub.   No murmur heard. Pulmonary/Chest: Effort normal and breath sounds normal. No respiratory distress. He has no wheezes. He has no rales.  Abdominal: Soft. Bowel sounds are normal. He exhibits no distension. There is no tenderness. There is no rebound.  Musculoskeletal: Normal range of motion.  Palpable effusion and left knee. Not warm or erythematous to the touch. He has symmetric bilateral lower extremity edema for which she wears compression stockings. No erythema asymmetry or cording  Neurological: He is alert and oriented to person, place, and time.  Skin: Skin is warm and dry. No rash noted.  Psychiatric: He has a normal mood and affect. His behavior is normal.     ED Treatments / Results  Labs (all labs ordered are listed, but only abnormal results are displayed) Labs Reviewed  CBC WITH DIFFERENTIAL/PLATELET - Abnormal; Notable for the following:       Result Value   RBC 3.37 (*)    Hemoglobin 10.2 (*)    HCT 31.1 (*)    RDW 16.7 (*)    All other components within normal limits  BASIC METABOLIC PANEL - Abnormal; Notable for the following:    CO2 21 (*)    Glucose, Bld 313 (*)    BUN 33 (*)    Creatinine, Ser  1.94 (*)    Calcium 8.8 (*)    GFR calc non Af Amer 37 (*)    GFR calc Af Amer 42 (*)    All other components within normal limits    EKG  EKG Interpretation None       Radiology No results found.  Procedures Procedures (including critical care time)  Medications Ordered in ED Medications  morphine 4 MG/ML injection 4 mg (4 mg Intravenous Given 12/05/16 1358)  ondansetron (ZOFRAN) injection 4 mg (4 mg Intravenous Given 12/05/16 1353)  colchicine tablet 0.6 mg (0.6 mg Oral Given 12/05/16 1351)     Initial Impression / Assessment and Plan / ED Course  I have reviewed the triage vital signs and the nursing notes.  Pertinent labs & imaging results that were available during my care of the patient were reviewed by me and considered in my medical decision making (see chart for details).     Differential diagnosis would include gout, nonspecific arthritis, infectious arthritis. He is not febrile the knee is not warm loss I doubt infectious arthritis. We discussed aspiration versus surrogate markers. He had a strong preference not to have aspiration of his knee performed. His oral temperature is normal. He does not have white blood cell count elevation. His creatinine is slightly high 1.9. I feel we should avoid anti-inflammatories. I discussed that he may have slight elevation of his sugars up to 100 points with daily prednisone. We'll use colchicine, Percocet, prednisone. Primary care follow-up.  Final Clinical Impressions(s) / ED Diagnoses   Final diagnoses:  Acute gout of left knee, unspecified cause    New Prescriptions New Prescriptions   COLCHICINE 0.6 MG TABLET    Take 1 tablet (0.6 mg total) by mouth daily.   OXYCODONE-ACETAMINOPHEN (  PERCOCET/ROXICET) 5-325 MG TABLET    Take 2 tablets by mouth every 4 (four) hours as needed.   PREDNISONE (DELTASONE) 20 MG TABLET    Take 1 tablet (20 mg total) by mouth daily with breakfast.     Tanna Furry, MD 12/05/16 1455

## 2016-12-09 ENCOUNTER — Telehealth (INDEPENDENT_AMBULATORY_CARE_PROVIDER_SITE_OTHER): Payer: Self-pay | Admitting: Internal Medicine

## 2016-12-09 NOTE — Telephone Encounter (Signed)
Patient called and requested more Dexilant samples as soon as possible.  He also wants to know if he needs to stay on the Codeine and if so, he needs more because he is out.  [phone]

## 2016-12-10 NOTE — Telephone Encounter (Signed)
Per Dr.Rehman if the patient states that the Codeine is helping we may call in the codeine, if not we will not call in more codeine. Patient states that it is not helping that he has not seen that much difference. Samples of the Dexilant for the patient is ready for him to pick up.

## 2016-12-15 ENCOUNTER — Encounter (HOSPITAL_COMMUNITY): Payer: BLUE CROSS/BLUE SHIELD

## 2016-12-18 ENCOUNTER — Other Ambulatory Visit: Payer: Self-pay | Admitting: Cardiovascular Disease

## 2016-12-26 ENCOUNTER — Other Ambulatory Visit (HOSPITAL_COMMUNITY): Payer: Self-pay

## 2016-12-26 DIAGNOSIS — C9001 Multiple myeloma in remission: Secondary | ICD-10-CM

## 2016-12-26 MED ORDER — LENALIDOMIDE 5 MG PO CAPS: ORAL_CAPSULE | ORAL | 0 refills | Status: DC

## 2016-12-27 ENCOUNTER — Other Ambulatory Visit (HOSPITAL_COMMUNITY): Payer: Self-pay | Admitting: Oncology

## 2016-12-29 ENCOUNTER — Other Ambulatory Visit (HOSPITAL_COMMUNITY): Payer: Self-pay | Admitting: Oncology

## 2017-01-06 ENCOUNTER — Telehealth (INDEPENDENT_AMBULATORY_CARE_PROVIDER_SITE_OTHER): Payer: Self-pay | Admitting: Internal Medicine

## 2017-01-06 ENCOUNTER — Other Ambulatory Visit (INDEPENDENT_AMBULATORY_CARE_PROVIDER_SITE_OTHER): Payer: Self-pay | Admitting: Internal Medicine

## 2017-01-06 ENCOUNTER — Other Ambulatory Visit (HOSPITAL_COMMUNITY): Payer: Self-pay | Admitting: Oncology

## 2017-01-06 NOTE — Telephone Encounter (Signed)
Patient called and stated that he is in his last days of Dexilant and needs more samples.  [phone]

## 2017-01-07 NOTE — Telephone Encounter (Signed)
Patient was called . He had called also and ask that his records be released to another physician,gastroenterolgist. A message was left asking him if he was transferring to him for continuation of his care , if so , we have a policy we cannot give samples if you are no longer our patient.  I ask that he call and clarify this with me.

## 2017-01-12 ENCOUNTER — Encounter (HOSPITAL_COMMUNITY): Payer: BLUE CROSS/BLUE SHIELD | Attending: Oncology

## 2017-01-12 ENCOUNTER — Ambulatory Visit (HOSPITAL_COMMUNITY): Payer: BLUE CROSS/BLUE SHIELD

## 2017-01-12 DIAGNOSIS — Z87891 Personal history of nicotine dependence: Secondary | ICD-10-CM | POA: Diagnosis not present

## 2017-01-12 DIAGNOSIS — D801 Nonfamilial hypogammaglobulinemia: Secondary | ICD-10-CM | POA: Diagnosis not present

## 2017-01-12 DIAGNOSIS — E785 Hyperlipidemia, unspecified: Secondary | ICD-10-CM | POA: Insufficient documentation

## 2017-01-12 DIAGNOSIS — G4730 Sleep apnea, unspecified: Secondary | ICD-10-CM | POA: Diagnosis not present

## 2017-01-12 DIAGNOSIS — I2510 Atherosclerotic heart disease of native coronary artery without angina pectoris: Secondary | ICD-10-CM | POA: Diagnosis not present

## 2017-01-12 DIAGNOSIS — K219 Gastro-esophageal reflux disease without esophagitis: Secondary | ICD-10-CM | POA: Diagnosis not present

## 2017-01-12 DIAGNOSIS — C9001 Multiple myeloma in remission: Secondary | ICD-10-CM | POA: Diagnosis present

## 2017-01-12 DIAGNOSIS — I252 Old myocardial infarction: Secondary | ICD-10-CM | POA: Insufficient documentation

## 2017-01-12 DIAGNOSIS — E1165 Type 2 diabetes mellitus with hyperglycemia: Secondary | ICD-10-CM | POA: Insufficient documentation

## 2017-01-12 DIAGNOSIS — D696 Thrombocytopenia, unspecified: Secondary | ICD-10-CM | POA: Diagnosis not present

## 2017-01-12 DIAGNOSIS — I4892 Unspecified atrial flutter: Secondary | ICD-10-CM | POA: Insufficient documentation

## 2017-01-12 DIAGNOSIS — N183 Chronic kidney disease, stage 3 (moderate): Secondary | ICD-10-CM | POA: Diagnosis not present

## 2017-01-12 DIAGNOSIS — K580 Irritable bowel syndrome with diarrhea: Secondary | ICD-10-CM | POA: Insufficient documentation

## 2017-01-12 DIAGNOSIS — E1122 Type 2 diabetes mellitus with diabetic chronic kidney disease: Secondary | ICD-10-CM | POA: Insufficient documentation

## 2017-01-12 DIAGNOSIS — D649 Anemia, unspecified: Secondary | ICD-10-CM | POA: Insufficient documentation

## 2017-01-12 DIAGNOSIS — M109 Gout, unspecified: Secondary | ICD-10-CM | POA: Diagnosis not present

## 2017-01-12 DIAGNOSIS — M545 Low back pain: Secondary | ICD-10-CM | POA: Insufficient documentation

## 2017-01-12 DIAGNOSIS — I129 Hypertensive chronic kidney disease with stage 1 through stage 4 chronic kidney disease, or unspecified chronic kidney disease: Secondary | ICD-10-CM | POA: Insufficient documentation

## 2017-01-12 LAB — CBC WITH DIFFERENTIAL/PLATELET
Basophils Absolute: 0 10*3/uL (ref 0.0–0.1)
Basophils Relative: 0 %
Eosinophils Absolute: 0.3 10*3/uL (ref 0.0–0.7)
Eosinophils Relative: 4 %
HCT: 29.7 % — ABNORMAL LOW (ref 39.0–52.0)
Hemoglobin: 9.5 g/dL — ABNORMAL LOW (ref 13.0–17.0)
Lymphocytes Relative: 28 %
Lymphs Abs: 2 10*3/uL (ref 0.7–4.0)
MCH: 30.2 pg (ref 26.0–34.0)
MCHC: 32 g/dL (ref 30.0–36.0)
MCV: 94.3 fL (ref 78.0–100.0)
Monocytes Absolute: 0.5 10*3/uL (ref 0.1–1.0)
Monocytes Relative: 7 %
Neutro Abs: 4.4 10*3/uL (ref 1.7–7.7)
Neutrophils Relative %: 61 %
Platelets: 147 10*3/uL — ABNORMAL LOW (ref 150–400)
RBC: 3.15 MIL/uL — ABNORMAL LOW (ref 4.22–5.81)
RDW: 17.7 % — ABNORMAL HIGH (ref 11.5–15.5)
WBC: 7.2 10*3/uL (ref 4.0–10.5)

## 2017-01-12 LAB — COMPREHENSIVE METABOLIC PANEL
ALT: 38 U/L (ref 17–63)
AST: 39 U/L (ref 15–41)
Albumin: 3 g/dL — ABNORMAL LOW (ref 3.5–5.0)
Alkaline Phosphatase: 87 U/L (ref 38–126)
Anion gap: 8 (ref 5–15)
BUN: 32 mg/dL — ABNORMAL HIGH (ref 6–20)
CO2: 24 mmol/L (ref 22–32)
Calcium: 8.1 mg/dL — ABNORMAL LOW (ref 8.9–10.3)
Chloride: 109 mmol/L (ref 101–111)
Creatinine, Ser: 1.71 mg/dL — ABNORMAL HIGH (ref 0.61–1.24)
GFR calc Af Amer: 49 mL/min — ABNORMAL LOW (ref >60)
GFR calc non Af Amer: 43 mL/min — ABNORMAL LOW (ref >60)
Glucose, Bld: 114 mg/dL — ABNORMAL HIGH (ref 65–99)
Potassium: 3.9 mmol/L (ref 3.5–5.1)
Sodium: 141 mmol/L (ref 135–145)
Total Bilirubin: 0.7 mg/dL (ref 0.3–1.2)
Total Protein: 5.4 g/dL — ABNORMAL LOW (ref 6.5–8.1)

## 2017-01-12 LAB — LACTATE DEHYDROGENASE: LDH: 230 U/L — ABNORMAL HIGH (ref 98–192)

## 2017-01-12 MED ORDER — SODIUM CHLORIDE 0.9% FLUSH
10.0000 mL | INTRAVENOUS | Status: DC | PRN
  Administered 2017-01-12: 10 mL via INTRAVENOUS
  Filled 2017-01-12: qty 10

## 2017-01-12 MED ORDER — HEPARIN SOD (PORK) LOCK FLUSH 100 UNIT/ML IV SOLN
500.0000 [IU] | Freq: Once | INTRAVENOUS | Status: AC
  Administered 2017-01-12: 500 [IU] via INTRAVENOUS

## 2017-01-12 NOTE — Patient Instructions (Signed)
[place] at [facility] Discharge Instructions  RECOMMENDATIONS MADE BY THE CONSULTANT AND ANY TEST RESULTS WILL BE SENT TO YOUR REFERRING PHYSICIAN.  You had your port flushed and lab work today continue to get port flushed every 6-8 weeks. You will have lab work come back over the next few days but we will go over the lab work at your next office visit.    Thank you for choosing [place] at [facility] to provide your oncology and hematology care.  To afford each patient quality time with our provider, please arrive at least 15 minutes before your scheduled appointment time.    If you have a lab appointment with the [facility] please come in thru the  Main Entrance and check in at the main information desk  You need to re-schedule your appointment should you arrive 10 or more minutes late.  We strive to give you quality time with our providers, and arriving late affects you and other patients whose appointments are after yours.  Also, if you no show three or more times for appointments you may be dismissed from the clinic at the providers discretion.     Again, thank you for choosing [facility].  Our hope is that these requests will decrease the amount of time that you wait before being seen by our physicians.       _____________________________________________________________  Should you have questions after your visit to [facility], please contact our office at [phone] between the hours of 8:30 a.m. and 4:30 p.m.  Voicemails left after 4:30 p.m. will not be returned until the following business day.  For prescription refill requests, have your pharmacy contact our office.       Resources For Cancer Patients and their Caregivers ? American Cancer Society: Can assist with transportation, wigs, general needs, runs Look Good Feel Better.        (617)073-3531 ? Cancer Care: Provides financial  assistance, online support groups, medication/co-pay assistance.  1-800-813-HOPE 361 004 4120) ? [facility] Assists [place] cancer patients and their families through emotional , educational and financial support.  805-293-9719 ? [place] DSS Where to apply for food stamps, Medicaid and utility assistance. 346-659-5842 ? [facility]: Transportation to medical appointments. (613)731-2153 ? Social Security Administration: May apply for disability if have a Stage IV cancer. 519-657-4593 586-711-9928 ? [employer], Disability and Transit Services: Assists with nutrition, care and transit needs. [place] Support Programs: @10RELATIVEDAYS @ > Cancer Support Group  2nd Tuesday of the month 1pm-2pm, Journey Room  > Creative Journey  3rd Tuesday of the month 1130am-1pm, Journey Room  > Look Good Feel Better  1st Wednesday of the month 10am-12 noon, Journey Room (Call [place] to register [phone])

## 2017-01-12 NOTE — Progress Notes (Signed)
[name] presented for Portacath access and flush. Portacath located right chest wall accessed with  H 20 needle. Good blood return present. Portacath flushed with 35ml NS and 500U/67ml Heparin and needle removed intact. Procedure without incident. Patient tolerated procedure well. Patient discharged ambulatory with cane and in stable condition with spouse. Patient provided with a copy of upcoming appointments. Patient to follow up as scheduled.

## 2017-01-13 LAB — PROTEIN ELECTROPHORESIS, SERUM
A/G Ratio: 1.6 (ref 0.7–1.7)
Albumin ELP: 3 g/dL (ref 2.9–4.4)
Alpha-1-Globulin: 0.2 g/dL (ref 0.0–0.4)
Alpha-2-Globulin: 0.6 g/dL (ref 0.4–1.0)
Beta Globulin: 0.8 g/dL (ref 0.7–1.3)
Gamma Globulin: 0.2 g/dL — ABNORMAL LOW (ref 0.4–1.8)
Globulin, Total: 1.9 g/dL — ABNORMAL LOW (ref 2.2–3.9)
Total Protein ELP: 4.9 g/dL — ABNORMAL LOW (ref 6.0–8.5)

## 2017-01-13 LAB — IGG, IGA, IGM
IgA: 55 mg/dL — ABNORMAL LOW (ref 90–386)
IgG (Immunoglobin G), Serum: 301 mg/dL — ABNORMAL LOW (ref 700–1600)
IgM, Serum: 43 mg/dL (ref 20–172)

## 2017-01-13 LAB — KAPPA/LAMBDA LIGHT CHAINS
Kappa free light chain: 24.5 mg/L — ABNORMAL HIGH (ref 3.3–19.4)
Kappa, lambda light chain ratio: 1.24 (ref 0.26–1.65)
Lambda free light chains: 19.7 mg/L (ref 5.7–26.3)

## 2017-01-13 LAB — BETA 2 MICROGLOBULIN, SERUM: Beta-2 Microglobulin: 3.4 mg/L — ABNORMAL HIGH (ref 0.6–2.4)

## 2017-01-14 ENCOUNTER — Other Ambulatory Visit: Payer: Self-pay | Admitting: Podiatry

## 2017-01-14 DIAGNOSIS — M869 Osteomyelitis, unspecified: Secondary | ICD-10-CM

## 2017-01-14 DIAGNOSIS — L97514 Non-pressure chronic ulcer of other part of right foot with necrosis of bone: Secondary | ICD-10-CM

## 2017-01-14 DIAGNOSIS — E1142 Type 2 diabetes mellitus with diabetic polyneuropathy: Secondary | ICD-10-CM

## 2017-01-14 LAB — IMMUNOFIXATION ELECTROPHORESIS
IgA: 53 mg/dL — ABNORMAL LOW (ref 90–386)
IgG (Immunoglobin G), Serum: 316 mg/dL — ABNORMAL LOW (ref 700–1600)
IgM (Immunoglobulin M), Srm: 35 mg/dL (ref 20–172)
Total Protein ELP: 4.7 g/dL — ABNORMAL LOW (ref 6.0–8.5)

## 2017-01-16 ENCOUNTER — Ambulatory Visit
Admission: RE | Admit: 2017-01-16 | Discharge: 2017-01-16 | Disposition: A | Discharge status: Home / Self Care | Payer: BLUE CROSS/BLUE SHIELD | Source: Ambulatory Visit | Attending: Podiatry | Admitting: Podiatry

## 2017-01-16 DIAGNOSIS — E1142 Type 2 diabetes mellitus with diabetic polyneuropathy: Secondary | ICD-10-CM

## 2017-01-16 DIAGNOSIS — M869 Osteomyelitis, unspecified: Secondary | ICD-10-CM

## 2017-01-16 DIAGNOSIS — L97514 Non-pressure chronic ulcer of other part of right foot with necrosis of bone: Secondary | ICD-10-CM

## 2017-01-23 ENCOUNTER — Other Ambulatory Visit (HOSPITAL_COMMUNITY): Payer: Self-pay

## 2017-01-23 DIAGNOSIS — C9001 Multiple myeloma in remission: Secondary | ICD-10-CM

## 2017-01-23 MED ORDER — LENALIDOMIDE 5 MG PO CAPS: ORAL_CAPSULE | ORAL | 0 refills | Status: DC

## 2017-01-23 NOTE — Telephone Encounter (Signed)
Received refill request from patients speciality pharmacy for [name]. Reviewed with provider, chart checked and refilled.

## 2017-01-27 ENCOUNTER — Ambulatory Visit (INDEPENDENT_AMBULATORY_CARE_PROVIDER_SITE_OTHER): Payer: BLUE CROSS/BLUE SHIELD | Admitting: Internal Medicine

## 2017-01-29 ENCOUNTER — Emergency Department (HOSPITAL_COMMUNITY)
Admission: EM | Admit: 2017-01-29 | Discharge: 2017-01-29 | Disposition: A | Discharge status: Home / Self Care | Payer: BLUE CROSS/BLUE SHIELD | Attending: Emergency Medicine | Admitting: Emergency Medicine

## 2017-01-29 ENCOUNTER — Telehealth (HOSPITAL_COMMUNITY): Payer: Self-pay

## 2017-01-29 ENCOUNTER — Encounter (HOSPITAL_COMMUNITY): Payer: Self-pay | Admitting: Emergency Medicine

## 2017-01-29 ENCOUNTER — Ambulatory Visit (HOSPITAL_COMMUNITY): Payer: BLUE CROSS/BLUE SHIELD

## 2017-01-29 ENCOUNTER — Emergency Department (HOSPITAL_COMMUNITY): Payer: BLUE CROSS/BLUE SHIELD

## 2017-01-29 DIAGNOSIS — Z7901 Long term (current) use of anticoagulants: Secondary | ICD-10-CM | POA: Insufficient documentation

## 2017-01-29 DIAGNOSIS — E1122 Type 2 diabetes mellitus with diabetic chronic kidney disease: Secondary | ICD-10-CM | POA: Insufficient documentation

## 2017-01-29 DIAGNOSIS — Z87891 Personal history of nicotine dependence: Secondary | ICD-10-CM | POA: Insufficient documentation

## 2017-01-29 DIAGNOSIS — I129 Hypertensive chronic kidney disease with stage 1 through stage 4 chronic kidney disease, or unspecified chronic kidney disease: Secondary | ICD-10-CM | POA: Diagnosis not present

## 2017-01-29 DIAGNOSIS — M25561 Pain in right knee: Secondary | ICD-10-CM | POA: Diagnosis not present

## 2017-01-29 DIAGNOSIS — I5022 Chronic systolic (congestive) heart failure: Secondary | ICD-10-CM | POA: Insufficient documentation

## 2017-01-29 DIAGNOSIS — Z7982 Long term (current) use of aspirin: Secondary | ICD-10-CM | POA: Insufficient documentation

## 2017-01-29 DIAGNOSIS — N183 Chronic kidney disease, stage 3 (moderate): Secondary | ICD-10-CM | POA: Diagnosis not present

## 2017-01-29 DIAGNOSIS — Z79899 Other long term (current) drug therapy: Secondary | ICD-10-CM | POA: Insufficient documentation

## 2017-01-29 DIAGNOSIS — Z794 Long term (current) use of insulin: Secondary | ICD-10-CM | POA: Insufficient documentation

## 2017-01-29 DIAGNOSIS — I110 Hypertensive heart disease with heart failure: Secondary | ICD-10-CM | POA: Insufficient documentation

## 2017-01-29 DIAGNOSIS — I252 Old myocardial infarction: Secondary | ICD-10-CM | POA: Insufficient documentation

## 2017-01-29 MED ORDER — OXYCODONE-ACETAMINOPHEN 5-325 MG PO TABS: 1.0000 | ORAL_TABLET | Freq: Once | ORAL | Status: DC

## 2017-01-29 MED ORDER — OXYCODONE-ACETAMINOPHEN 5-325 MG PO TABS
2.0000 | ORAL_TABLET | Freq: Once | ORAL | Status: AC
  Administered 2017-01-29: 2 via ORAL
  Filled 2017-01-29: qty 2

## 2017-01-29 MED ORDER — OXYCODONE-ACETAMINOPHEN 5-325 MG PO TABS: ORAL_TABLET | ORAL | 0 refills | Status: DC

## 2017-01-29 MED ORDER — PREDNISONE 20 MG PO TABS: 20.0000 mg | ORAL_TABLET | Freq: Every day | ORAL | 0 refills | Status: DC

## 2017-01-29 NOTE — Discharge Instructions (Signed)
Take the prescriptions as directed.  Apply moist heat or ice to the area(s) of discomfort, for 15 minutes at a time, several times per day for the next few days.  Do not fall asleep on a heating or ice pack.  Call your regular medical doctor and the Orthopedic doctor to schedule a follow up appointment within the next week.  Return to the Emergency Department immediately if worsening.

## 2017-01-29 NOTE — ED Provider Notes (Signed)
[facility] DEPT Provider Note   CSN: [id number] Arrival date & time: 01/29/17  1830     History   Chief Complaint Chief Complaint  Patient presents with  . Knee Pain    HPI [name] is a 56 y.o. male.  HPI  Pt was seen at 1850. Per pt, c/o gradual onset and persistence of constant right knee "pain" since yesterday. Pt describes the pain as "my gout." States he has run out of colchicine. Denies fevers, no rash, no focal motor weakness, no tingling/numbness in extremities, no injury. The symptoms have been associated with no other complaints. The patient has a significant history of similar symptoms previously, recently being evaluated for this complaint.   Past Medical History:  Diagnosis Date  . Arteriosclerotic cardiovascular disease (ASCVD)    MI-2000s; stent to the proximal LAD and diagonal in 2001; stress nuclear in 2008-impaired exercise capacity, left ventricular dilatation, moderately to severely depressed EF, apical, inferior and anteroseptal scar  . Atrial flutter ([place])   . 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 Cellulitis of leg    both legs  . Chronic diarrhea   . Chronic kidney disease, stage 3, mod decreased GFR    Creatinine of 1.84 in 06/2011 and 1.5 in 07/2011  . Diabetes mellitus    Insulin  . GERD (gastroesophageal reflux disease)   . Gout   . Hyperlipidemia   . Hypertension   . Injection site reaction   . Multiple myeloma 07/01/2011  . Myocardial infarction ([place]) 2000  . Obesity   . Pedal edema    Venous insufficiency  . Sleep apnea    uses cpap  . Ulcer     Patient Active Problem List   Diagnosis Date Noted  . Pneumonia 06/10/2016  . CAP (community acquired pneumonia) 06/10/2016  . Cellulitis of left lower extremity   . Hypogammaglobulinemia ([place]) 03/09/2016  . Hypokalemia 03/09/2016  . Diaphragmatic hernia without obstruction 02/26/2016  . Chronic systolic heart failure ([place]) 12/19/2013  . Anemia, normocytic normochromic  10/09/2012  . Chronic pancreatitis ([place]) 10/07/2012  . Atrial flutter ([place]) 09/17/2012  . DDD (degenerative disc disease), cervical 03/18/2012  . Arteriosclerotic cardiovascular disease (ASCVD)   . Hypertension   . Hyperlipidemia   . Multiple myeloma ([place]) 07/01/2011  . Morbid obesity ([place]) 04/29/2010  . Insulin dependent diabetes mellitus ([place]) 11/15/2008  . Sleep apnea 11/15/2008    Past Surgical History:  Procedure Laterality Date  . ABSCESS DRAINAGE     Scrotal  . BIOPSY  01/02/2012   Procedure: BIOPSY;  Surgeon: Rogene Houston, MD;  Location: [facility];  Service: Endoscopy;  Laterality: N/A;  . BONE MARROW BIOPSY  05/13/11  . CARDIAC CATHETERIZATION     cardiac stent  . CARDIOVERSION N/A 10/13/2012   Procedure: CARDIOVERSION;  Surgeon: Yehuda Savannah, MD;  Location: [facility];  Service: Cardiovascular;  Laterality: N/A;  . CATARACT EXTRACTION W/PHACO Left 02/13/2014   Procedure: CATARACT EXTRACTION PHACO AND INTRAOCULAR LENS PLACEMENT ([place]);  Surgeon: Tonny Branch, MD;  Location: [facility];  Service: Ophthalmology;  Laterality: Left;  CDE:  7.67  . CATARACT EXTRACTION W/PHACO Right 03/02/2014   Procedure: CATARACT EXTRACTION PHACO AND INTRAOCULAR LENS PLACEMENT RIGHT EYE CDE=16.81;  Surgeon: Tonny Branch, MD;  Location: [facility];  Service: Ophthalmology;  Laterality: Right;  . COLONOSCOPY  11/28/2011   Procedure: COLONOSCOPY;  Surgeon: Rogene Houston, MD;  [facility];  Service: Endoscopy;  Laterality: N/A;  930  . ESOPHAGOGASTRODUODENOSCOPY  01/02/2012  Procedure: ESOPHAGOGASTRODUODENOSCOPY (EGD);  Surgeon: Rogene Houston, MD;  [facility];  Service: Endoscopy;  Laterality: N/A;  100  . ESOPHAGOGASTRODUODENOSCOPY N/A 09/20/2012   Procedure: ESOPHAGOGASTRODUODENOSCOPY (EGD);  Surgeon: Rogene Houston, MD;  Location[facility];  Service: Endoscopy;  Laterality: N/A;  . EUS N/A 10/07/2012   Procedure: UPPER ENDOSCOPIC ULTRASOUND (EUS) LINEAR;  Surgeon: Milus Banister, MD;  Location: [facility];  Service: Endoscopy;  Laterality: N/A;  . INCISION AND DRAINAGE ABSCESS ANAL    . LAPAROSCOPIC GASTRIC BANDING  2006   has been removed  . PORT-A-CATH REMOVAL Left 12/07/2012   Procedure: REMOVAL PORT-A-CATH;  Surgeon: Scherry Ran, MD;  Location: [facility];  Service: General;  Laterality: Left;  . PORTACATH PLACEMENT  07/07/2011   Procedure: INSERTION PORT-A-CATH;  Surgeon: Scherry Ran, MD;  Location: [facility];  Service: General;  Laterality: N/A;  . PORTACATH PLACEMENT N/A 12/07/2012   Procedure: INSERTION PORT-A-CATH;  Surgeon: Scherry Ran, MD;  Location: [facility];  Service: General;  Laterality: N/A;  Attempted portacath placement on left and right side  . WRIST SURGERY     Left; removal of bone fragment       Home Medications    Prior to Admission medications   Medication Sig Start Date End Date Taking? Authorizing Provider  acyclovir (ZOVIRAX) 400 MG tablet TAKE ONE TABLET BY MOUTH EVERY MORNING. 03/02/16   Baird Cancer, PA-C  allopurinol (ZYLOPRIM) 300 MG tablet TAKE ONE TABLET BY MOUTH ONCE DAILY. 10/29/16   Baird Cancer, PA-C  allopurinol (ZYLOPRIM) 300 MG tablet TAKE ONE TABLET BY MOUTH ONCE DAILY. 01/08/17   Holley Bouche, NP  Artificial Tear Ointment (DRY EYES OP) Place 1 drop into both eyes as needed (dry eyes).    [provider]  aspirin EC 81 MG tablet Take 81 mg by mouth daily.    [provider]  atorvastatin (LIPITOR) 80 MG tablet TAKE ONE TABLET BY MOUTH AT BEDTIME. 12/18/16   Herminio Commons, MD  calcitRIOL (ROCALTROL) 0.25 MCG capsule Take 0.25 mcg by mouth 3 (three) times a week. Monday, Wednesday, Friday. 12/14/13   [provider]  calcium carbonate (OS-CAL) 600 MG TABS tablet Take 1,800 mg by mouth 2 (two) times daily.     [provider]  codeine 30 MG tablet Take 1 tablet (30 mg total) by mouth 2 (two) times daily. 10/21/16   Rogene Houston, MD  colchicine 0.6 MG  tablet Take 1 tablet (0.6 mg total) by mouth daily. 12/05/16   Tanna Furry, MD  colchicine 0.6 MG tablet Take 1 tablet (0.6 mg total) by mouth 2 (two) times daily. 12/05/16   Tanna Furry, MD  collagenase (SANTYL) ointment Apply 1 application topically daily.    [provider]  dexamethasone (DECADRON) 4 MG tablet TAKE 5 TABLETS BY MOUTH 2 TIMES A DAY EVERY FRIDAY. 12/29/16   Baird Cancer, PA-C  dexlansoprazole (DEXILANT) 60 MG capsule Take 1 capsule (60 mg total) by mouth daily. 07/08/16   Rogene Houston, MD  HYDROcodone-acetaminophen (NORCO/VICODIN) 5-325 MG tablet Take 1-2 tablets by mouth every 4 (four) hours as needed for moderate pain.    [provider]  insulin detemir (LEVEMIR) 100 UNIT/ML injection Inject 40 Units into the skin every morning.  09/08/12   Kathie Dike, MD  insulin lispro (HUMALOG) 100 UNIT/ML injection Inject 5-30 Units into the skin 2 (two) times daily with a meal. Sliding Scale per patient  CBG 150-200: 5 units CBG 201-275: 10 units CBG 275-350: 15 units 351-425: 20 units 425-500: 25 units 500+: 30 units    [provider]  lenalidomide ([name]) 5 MG capsule TAKE 1 CAPSULE BY MOUTH DAILY FOR 7 DAYS ON FOLLOWED BY 7 DAYS OFF. 01/23/17   Twana First, MD  lisinopril (PRINIVIL,ZESTRIL) 5 MG tablet Take 5 mg by mouth every other day. 12/14/13   [provider]  loratadine (CLARITIN) 10 MG tablet Take 10 mg by mouth every morning.     [provider]  Magnesium Cl-Calcium Carbonate (SLOW-MAG PO) Take 1 tablet by mouth every morning.    [provider]  metoprolol succinate (TOPROL-XL) 25 MG 24 hr tablet TAKE ONE TABLET BY MOUTH ONCE DAILY. 10/29/16   Herminio Commons, MD  Multiple Vitamins-Minerals (MULTIVITAMINS THER. W/MINERALS) TABS Take 1 tablet by mouth daily.     [provider]  nitroGLYCERIN (NITROSTAT) 0.4 MG SL tablet Place 1 tablet (0.4 mg total) under the tongue every 5 (five) minutes as needed  for chest pain. 03/16/15   [place], Kelby Fam, MD  oxyCODONE-acetaminophen (PERCOCET/ROXICET) 5-325 MG tablet Take 2 tablets by mouth every 4 (four) hours as needed. 12/05/16   Tanna Furry, MD  oxyCODONE-acetaminophen (PERCOCET/ROXICET) 5-325 MG tablet Take 2 tablets by mouth every 4 (four) hours as needed. 12/05/16   Tanna Furry, MD  Pancrelipase, Lip-Prot-Amyl, (ZENPEP PO) Take 2-4 capsules by mouth See admin instructions. Patient is taking 4 with a meal and 2 with a snack.     [provider]  potassium chloride SA (K-DUR,KLOR-CON) 20 MEQ tablet TAKE 2 TABLETS BY MOUTH TWICE DAILY. 11/12/16   Baird Cancer, PA-C  silver sulfADIAZINE (SILVADENE) 1 % cream Apply 1 application topically daily.    [provider]  sodium bicarbonate 650 MG tablet Take 1,300 mg by mouth 3 (three) times daily.    [provider]  sulfamethoxazole-trimethoprim (BACTRIM DS,SEPTRA DS) 800-160 MG tablet TAKE ONE TABLET BY MOUTH EVERY MONDAY, WEDNESDAY, AND FRIDAY. 12/29/16   Baird Cancer, PA-C  torsemide (DEMADEX) 20 MG tablet TAKE 2 TABLETS BY MOUTH EACH MORNING AND 1 TABLET EACH EVENING. 04/22/16   Setzer, Rona Ravens, NP  Vitamin D, Ergocalciferol, (DRISDOL) 50000 units CAPS capsule TAKE 1 CAPSULE BY MOUTH EVERY THIRTY DAYS. 03/31/16   Baird Cancer, PA-C  XARELTO 20 MG TABS tablet TAKE 1 TABLET BY MOUTH DAILY. 09/02/16   Herminio Commons, MD  ZENPEP [phone] units CPEP TAKE 4 CAPSULES BY MOUTH WITH EACH MEAL AND 2 CAPSULES WITH SNACKS. (2 SNACKS PER DAY) 01/07/17   Setzer, Rona Ravens, NP    Family History Family History  Problem Relation Age of Onset  . Heart disease Mother   . Cancer Mother   . Diabetes Father   . Arthritis Unknown   . Anesthesia problems Neg Hx   . Hypotension Neg Hx   . Malignant hyperthermia Neg Hx   . Pseudochol deficiency Neg Hx     Social History Social History  Substance Use Topics  . Smoking status: Former Smoker    Packs/day: 0.25    Years: 1.00      Types: Cigarettes, Cigars    Quit date: 05/17/2001  . Smokeless tobacco: Never Used  . Alcohol use No     Allergies   Patient has no known allergies.   Review of Systems Review of Systems ROS: Statement: All systems negative except as marked or noted in the HPI; Constitutional: Negative for fever  and chills. ; ; Eyes: Negative for eye pain, redness and discharge. ; ; ENMT: Negative for ear pain, hoarseness, nasal congestion, sinus pressure and sore throat. ; ; Cardiovascular: Negative for chest pain, palpitations, diaphoresis, dyspnea and peripheral edema. ; ; Respiratory: Negative for cough, wheezing and stridor. ; ; Gastrointestinal: Negative for nausea, vomiting, diarrhea, abdominal pain, blood in stool, hematemesis, jaundice and rectal bleeding. . ; ; Genitourinary: Negative for dysuria, flank pain and hematuria. ; ; Musculoskeletal: +right knee pain. Negative for back pain and neck pain. Negative for deformity and trauma.; ; Skin: Negative for pruritus, rash, abrasions, blisters, bruising and skin lesion.; ; Neuro: Negative for headache, lightheadedness and neck stiffness. Negative for weakness, altered level of consciousness, altered mental status, extremity weakness, paresthesias, involuntary movement, seizure and syncope.       Physical Exam Updated Vital Signs BP 107/64 (BP Location: Right Arm)   Pulse 61   Temp 99 F (37.2 C) (Oral)   Resp 18   Ht _0  (1.905 m)   Wt (!) 167.8 kg (370 lb)   SpO2 98%   BMI 46.25 kg/m   Physical Exam 1855: Physical examination:  Nursing notes reviewed; Vital signs and O2 SAT reviewed;  Constitutional: Well developed, Well nourished, Well hydrated, In no acute distress; Head:  Normocephalic, atraumatic; Eyes: EOMI, PERRL, No scleral icterus; ENMT: Mouth and pharynx normal, Mucous membranes moist; Neck: Supple, Full range of motion, No lymphadenopathy; Cardiovascular: Regular rate and rhythm, No gallop; Respiratory: Breath sounds clear &  equal bilaterally, No wheezes.  Speaking full sentences with ease, Normal respiratory effort/excursion; Chest: Nontender, Movement normal; Abdomen: Soft, Nontender, Nondistended, Normal bowel sounds; Genitourinary: No CVA tenderness; Extremities: Pulses normal, +FROM right knee, including able to lift right LE off stretcher, and extend right lower leg against resistance.  No ligamentous laxity.  No patellar or quad tendon step-offs.  NMS intact right foot, strong pedal pp. +plantarflexion of right foot w/calf squeeze.  No palpable gap right Achilles's tendon.  No proximal fibular head tenderness.  No edema, erythema, warmth, ecchymosis or deformity.  +generalized TTP without specific area of point tenderness.  +1 pedal edema bilat without calf tenderness, erythema, or asymmetry.; Neuro: AA&Ox3, Major CN grossly intact.  Speech clear. No gross focal motor or sensory deficits in extremities.; Skin: Color normal, Warm, Dry.   ED Treatments / Results  Labs (all labs ordered are listed, but only abnormal results are displayed)    EKG  EKG Interpretation None       Radiology   Procedures Procedures (including critical care time)  Medications Ordered in ED Medications  oxyCODONE-acetaminophen (PERCOCET/ROXICET) 5-325 MG per tablet 2 tablet (2 tablets Oral Given 01/29/17 1901)     Initial Impression / Assessment and Plan / ED Course  I have reviewed the triage vital signs and the nursing notes.  Pertinent labs & imaging results that were available during my care of the patient were reviewed by me and considered in my medical decision making (see chart for details).  MDM Reviewed: previous chart, nursing note and vitals Interpretation: x-ray   Dg Knee Complete 4 Views Right Result Date: 01/29/2017 CLINICAL DATA:  Right anterior knee pain for "a while". EXAM: RIGHT KNEE - COMPLETE 4+ VIEW COMPARISON:  None. FINDINGS: Mild femorotibial joint space narrowing with spurring. No joint  effusion, fracture nor dislocations. Vascular calcifications are noted along the course of the popliteal and proximal tibial arteries. IMPRESSION: Mild degenerative femorotibial joint space narrowing and spurring. No acute fracture or  joint dislocations. Electronically Signed   By: Ashley Royalty M.D.   On: 01/29/2017 19:51     1957:  Pt afebrile and no erythema or warmth to right knee; doubt septic joint. Has hx elevated Cr; will not tx NSAIDs.  Will prescribe percocet, prednisone; encouraged to f/u with Ortho MD. Dx and testing d/w pt and family.  Questions answered.  Verb understanding, agreeable to d/c home with outpt f/u.     Final Clinical Impressions(s) / ED Diagnoses   Final diagnoses:  None    New Prescriptions New Prescriptions   No medications on file     Francine Graven, DO 02/01/17 2025

## 2017-01-29 NOTE — ED Triage Notes (Signed)
Pt reports right knee pain with no injury since last night.  States feels like gout and is out of Colchicine.

## 2017-01-29 NOTE — Telephone Encounter (Signed)
Patient called and said he is having a gout flare up in his knees. He can hardly walk. He takes allopurinol daily. In July he had another flare up and went to ER and was given colchicine. He called requesting refill on colchicine. He states he did not call his PCP because he is OOT until Monday. Explained to patient I would review with provider. If she says it is okay to refill, he wants prescription sent to [facility].

## 2017-01-30 NOTE — Telephone Encounter (Signed)
Patient states he ended up going to ER last night and had X-rays. The ER doctor gave him "steriods and pain medication" to take. Explained to patient that since his gout was not related to his history of cancer. Also instructed patient that the pain medication could make him sleepy and to be careful taking it and driving or working. The steroids could cause his blood sugar to be high and to watch them closely. Encouraged patient to follow up with his PCP when he gets back in town so they can get his leg pain under control. Patient verbalized understanding.

## 2017-01-30 NOTE — Telephone Encounter (Signed)
His gout flare is not related to his history of cancer. We do not feel comfortable managing his gout. If his pain is severe and having a flare and his PCP is not available, then he could consider urgent care or ED.  We do not want him to be in pain, but we cannot safely manage his gout as this is not our specialty.    Mike Craze, NP [place] [phone]

## 2017-02-16 ENCOUNTER — Other Ambulatory Visit (HOSPITAL_COMMUNITY): Payer: Self-pay | Admitting: Oncology

## 2017-02-17 ENCOUNTER — Encounter (HOSPITAL_COMMUNITY): Payer: Self-pay

## 2017-02-17 ENCOUNTER — Encounter (HOSPITAL_COMMUNITY): Payer: BLUE CROSS/BLUE SHIELD | Attending: Oncology | Admitting: Oncology

## 2017-02-17 VITALS — BP 97/51 | HR 51 | Resp 16

## 2017-02-17 DIAGNOSIS — I252 Old myocardial infarction: Secondary | ICD-10-CM | POA: Insufficient documentation

## 2017-02-17 DIAGNOSIS — N183 Chronic kidney disease, stage 3 (moderate): Secondary | ICD-10-CM | POA: Insufficient documentation

## 2017-02-17 DIAGNOSIS — K580 Irritable bowel syndrome with diarrhea: Secondary | ICD-10-CM | POA: Insufficient documentation

## 2017-02-17 DIAGNOSIS — D801 Nonfamilial hypogammaglobulinemia: Secondary | ICD-10-CM

## 2017-02-17 DIAGNOSIS — I129 Hypertensive chronic kidney disease with stage 1 through stage 4 chronic kidney disease, or unspecified chronic kidney disease: Secondary | ICD-10-CM | POA: Insufficient documentation

## 2017-02-17 DIAGNOSIS — C9001 Multiple myeloma in remission: Secondary | ICD-10-CM | POA: Diagnosis not present

## 2017-02-17 DIAGNOSIS — E1122 Type 2 diabetes mellitus with diabetic chronic kidney disease: Secondary | ICD-10-CM | POA: Insufficient documentation

## 2017-02-17 DIAGNOSIS — I2510 Atherosclerotic heart disease of native coronary artery without angina pectoris: Secondary | ICD-10-CM | POA: Insufficient documentation

## 2017-02-17 DIAGNOSIS — K219 Gastro-esophageal reflux disease without esophagitis: Secondary | ICD-10-CM | POA: Insufficient documentation

## 2017-02-17 DIAGNOSIS — I4892 Unspecified atrial flutter: Secondary | ICD-10-CM | POA: Insufficient documentation

## 2017-02-17 DIAGNOSIS — D696 Thrombocytopenia, unspecified: Secondary | ICD-10-CM | POA: Insufficient documentation

## 2017-02-17 DIAGNOSIS — D649 Anemia, unspecified: Secondary | ICD-10-CM | POA: Insufficient documentation

## 2017-02-17 DIAGNOSIS — E1165 Type 2 diabetes mellitus with hyperglycemia: Secondary | ICD-10-CM | POA: Insufficient documentation

## 2017-02-17 DIAGNOSIS — E785 Hyperlipidemia, unspecified: Secondary | ICD-10-CM | POA: Insufficient documentation

## 2017-02-17 DIAGNOSIS — G4730 Sleep apnea, unspecified: Secondary | ICD-10-CM | POA: Insufficient documentation

## 2017-02-17 DIAGNOSIS — C9000 Multiple myeloma not having achieved remission: Secondary | ICD-10-CM

## 2017-02-17 DIAGNOSIS — M545 Low back pain: Secondary | ICD-10-CM | POA: Insufficient documentation

## 2017-02-17 DIAGNOSIS — Z87891 Personal history of nicotine dependence: Secondary | ICD-10-CM | POA: Insufficient documentation

## 2017-02-17 DIAGNOSIS — M109 Gout, unspecified: Secondary | ICD-10-CM | POA: Insufficient documentation

## 2017-02-17 MED ORDER — GABAPENTIN 300 MG PO CAPS: ORAL_CAPSULE | ORAL | 1 refills | Status: DC

## 2017-02-17 MED ORDER — OMEPRAZOLE 40 MG PO CPDR: 40.0000 mg | DELAYED_RELEASE_CAPSULE | Freq: Every day | ORAL | 1 refills | Status: DC

## 2017-02-18 ENCOUNTER — Telehealth (HOSPITAL_COMMUNITY): Payer: Self-pay | Admitting: *Deleted

## 2017-02-18 ENCOUNTER — Other Ambulatory Visit (HOSPITAL_COMMUNITY): Payer: Self-pay | Admitting: Oncology

## 2017-02-18 MED ORDER — ACYCLOVIR 400 MG PO TABS: 400.0000 mg | ORAL_TABLET | Freq: Every morning | ORAL | 5 refills | Status: AC

## 2017-02-18 NOTE — Progress Notes (Signed)
[name], MD [address] [place] [address]  Multiple myeloma not having achieved remission [facility]) - Plan: CBC with Differential, Comprehensive metabolic panel, Multiple Myeloma Panel (SPEP&IFE w/QIG), Kappa/lambda light chains, Beta 2 microglobuline, serum  CURRENT THERAPY: [name]/Dexamethasone  INTERVAL HISTORY: [name] 57 y.o. male returns for followup of Stage II by ISS, kappa multiple myeloma, in remission.  Dx in 09/2014 with 3 g proteinuria (Bence-Jones).  He was evaluated at [facility] for stem cell transplant, but was not felt to be an ideal candidate.  He wishes to continue with Rev/Dex AND Hypogammaglobulinemia AND DM, Stage III CKD, IBS/diarrhea.  Patient presents today for continued follow-up. He states that last week he went to urgent care due to breakout of shingles across his mid abdomen. Previously patient stated another one of his physicians at taking him off the acyclovir. Currently he is back on acyclovir. Otherwise he denies any fatigue, chest pain, shortness breath, focal weakness. He continues to have his chronic back pain.   Review of Systems  Constitutional: Negative.  Negative for chills, fever and weight loss.  HENT: Negative.   Eyes: Negative.   Respiratory: Negative.  Negative for cough.   Cardiovascular: Negative.  Negative for chest pain.  Gastrointestinal: Negative for blood in stool, constipation, diarrhea, melena, nausea and vomiting.  Genitourinary: Negative.   Musculoskeletal: Positive for back pain.  Skin: Negative.        Shingles across mid abdomen  Neurological: Negative.  Negative for weakness.  Endo/Heme/Allergies: Negative.   Psychiatric/Behavioral: Negative.     Past Medical History:  Diagnosis Date  . Arteriosclerotic cardiovascular disease (ASCVD)    MI-2000s; stent to the proximal LAD and diagonal in 2001; stress nuclear in 2008-impaired exercise capacity, left ventricular dilatation, moderately to  severely depressed EF, apical, inferior and anteroseptal scar  . Atrial flutter ([place])   . 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 Cellulitis of leg    both legs  . Chronic diarrhea   . Chronic kidney disease, stage 3, mod decreased GFR    Creatinine of 1.84 in 06/2011 and 1.5 in 07/2011  . Diabetes mellitus    Insulin  . GERD (gastroesophageal reflux disease)   . Gout   . Hyperlipidemia   . Hypertension   . Injection site reaction   . Multiple myeloma 07/01/2011  . Myocardial infarction ([place]) 2000  . Obesity   . Pedal edema    Venous insufficiency  . Sleep apnea    uses cpap  . Ulcer     Past Surgical History:  Procedure Laterality Date  . ABSCESS DRAINAGE     Scrotal  . BIOPSY  01/02/2012   Procedure: BIOPSY;  Surgeon: Rogene Houston, MD;  Location[facility];  Service: Endoscopy;  Laterality: N/A;  . BONE MARROW BIOPSY  05/13/11  . CARDIAC CATHETERIZATION     cardiac stent  . CARDIOVERSION N/A 10/13/2012   Procedure: CARDIOVERSION;  Surgeon: Yehuda Savannah, MD;  Location: [facility] ORS;  Service: Cardiovascular;  Laterality: N/A;  . CATARACT EXTRACTION W/PHACO Left 02/13/2014   Procedure: CATARACT EXTRACTION PHACO AND INTRAOCULAR LENS PLACEMENT ([place]);  Surgeon: Tonny Branch, MD;  Location: [facility] ORS;  Service: Ophthalmology;  Laterality: Left;  CDE:  7.67  . CATARACT EXTRACTION W/PHACO Right 03/02/2014   Procedure: CATARACT EXTRACTION PHACO AND INTRAOCULAR LENS PLACEMENT RIGHT EYE CDE=16.81;  Surgeon: Tonny Branch, MD;  Location: [facility];  Service: Ophthalmology;  Laterality: Right;  . COLONOSCOPY  11/28/2011   Procedure: COLONOSCOPY;  Surgeon: Rogene Houston, MD;  [facility];  Service: Endoscopy;  Laterality: N/A;  930  . ESOPHAGOGASTRODUODENOSCOPY  01/02/2012   Procedure: ESOPHAGOGASTRODUODENOSCOPY (EGD);  Surgeon: Rogene Houston, MD;  [facility];  Service: Endoscopy;  Laterality: N/A;  100  . ESOPHAGOGASTRODUODENOSCOPY N/A 09/20/2012   Procedure:  ESOPHAGOGASTRODUODENOSCOPY (EGD);  Surgeon: Rogene Houston, MD;  [facility];  Service: Endoscopy;  Laterality: N/A;  . EUS N/A 10/07/2012   Procedure: UPPER ENDOSCOPIC ULTRASOUND (EUS) LINEAR;  Surgeon: Milus Banister, MD;  Location: [facility];  Service: Endoscopy;  Laterality: N/A;  . INCISION AND DRAINAGE ABSCESS ANAL    . LAPAROSCOPIC GASTRIC BANDING  2006   has been removed  . PORT-A-CATH REMOVAL Left 12/07/2012   Procedure: REMOVAL PORT-A-CATH;  Surgeon: Scherry Ran, MD;  Location: [facility];  Service: General;  Laterality: Left;  . PORTACATH PLACEMENT  07/07/2011   Procedure: INSERTION PORT-A-CATH;  Surgeon: Scherry Ran, MD;  Location: [facility];  Service: General;  Laterality: N/A;  . PORTACATH PLACEMENT N/A 12/07/2012   Procedure: INSERTION PORT-A-CATH;  Surgeon: Scherry Ran, MD;  Location: [facility];  Service: General;  Laterality: N/A;  Attempted portacath placement on left and right side  . WRIST SURGERY     Left; removal of bone fragment    Family History  Problem Relation Age of Onset  . Heart disease Mother   . Cancer Mother   . Diabetes Father   . Arthritis Unknown   . Anesthesia problems Neg Hx   . Hypotension Neg Hx   . Malignant hyperthermia Neg Hx   . Pseudochol deficiency Neg Hx     Social History   Social History  . Marital status: Married    Spouse name: N/A  . Number of children: N/A  . Years of education: 13   Occupational History  . Nurse, learning disability   . [employer]   Social History Main Topics  . Smoking status: Former Smoker    Packs/day: 0.25    Years: 1.00    Types: Cigarettes, Cigars    Quit date: 05/17/2001  . Smokeless tobacco: Never Used  . Alcohol use No  . Drug use: No  . Sexual activity: Yes    Birth control/ protection: None   Other Topics Concern  . None   Social History Narrative  . None     PHYSICAL EXAMINATION  ECOG PERFORMANCE STATUS: 1 - Symptomatic but completely  ambulatory  Vitals:   02/17/17 1028  BP: (!) 97/51  Pulse: (!) 51  Resp: 16  SpO2: 100%    GENERAL:alert, no distress, well nourished, well developed, comfortable, cooperative, obese and unaccompanied SKIN: skin color, texture, turgor are normal, no rashes or significant lesions HEAD: Normocephalic, No masses, lesions, tenderness or abnormalities EYES: normal, EOMI EARS: External ears normal OROPHARYNX:lips, buccal mucosa, and tongue normal  NECK: supple, no adenopathy, trachea midline LYMPH:  no palpable lymphadenopathy BREAST:not examined LUNGS: clear to auscultation and percussion HEART: regular rate & rhythm, no murmurs and no gallops ABDOMEN:abdomen soft, non-tender, obese and normal bowel sounds BACK: Back symmetric, no curvature. EXTREMITIES:less then 2 second capillary refill, no joint deformities, effusion, or inflammation, no skin discoloration, no cyanosis, positive findings:  Pedal boot on L.  NEURO: alert & oriented x 3 with fluent speech, no focal motor/sensory deficits, gait normal    LABORATORY DATA: CBC    Component Value Date/Time  WBC 7.2 01/12/2017 1325   RBC 3.15 (L) 01/12/2017 1325   HGB 9.5 (L) 01/12/2017 1325   HCT 29.7 (L) 01/12/2017 1325   PLT 147 (L) 01/12/2017 1325   MCV 94.3 01/12/2017 1325   MCH 30.2 01/12/2017 1325   MCHC 32.0 01/12/2017 1325   RDW 17.7 (H) 01/12/2017 1325   LYMPHSABS 2.0 01/12/2017 1325   MONOABS 0.5 01/12/2017 1325   EOSABS 0.3 01/12/2017 1325   BASOSABS 0.0 01/12/2017 1325      Chemistry      Component Value Date/Time   NA 141 01/12/2017 1325   K 3.9 01/12/2017 1325   CL 109 01/12/2017 1325   CO2 24 01/12/2017 1325   BUN 32 (H) 01/12/2017 1325   CREATININE 1.71 (H) 01/12/2017 1325   CREATININE 2.24 (H) 10/05/2012 1411      Component Value Date/Time   CALCIUM 8.1 (L) 01/12/2017 1325   CALCIUM 8.3 (L) 02/07/2016 1521   ALKPHOS 87 01/12/2017 1325   AST 39 01/12/2017 1325   ALT 38 01/12/2017 1325   BILITOT  0.7 01/12/2017 1325     Lab Results  Component Value Date   PROT 5.4 (L) 01/12/2017   ALBUMINELP 3.0 01/12/2017   A1GS 0.2 01/12/2017   A2GS 0.6 01/12/2017   BETS 0.8 01/12/2017   BETA2SER 4.9 08/10/2014   GAMS 0.2 (L) 01/12/2017   MSPIKE Not Observed 01/12/2017   SPEI Comment 01/12/2017   SPECOM Comment 01/12/2017   IGGSERUM 301 (L) 01/12/2017   IGGSERUM 316 (L) 01/12/2017   IGA 55 (L) 01/12/2017   IGA 53 (L) 01/12/2017   IGMSERUM 43 01/12/2017   IGMSERUM 35 01/12/2017   IMMELINT (NOTE) 08/10/2014   KPAFRELGTCHN 24.5 (H) 01/12/2017   LAMBDASER 19.7 01/12/2017   KAPLAMBRATIO 1.24 01/12/2017    PENDING LABS:   RADIOGRAPHIC STUDIES:  Dg Knee Complete 4 Views Right  Result Date: 01/29/2017 CLINICAL DATA:  Right anterior knee pain for "a while". EXAM: RIGHT KNEE - COMPLETE 4+ VIEW COMPARISON:  None. FINDINGS: Mild femorotibial joint space narrowing with spurring. No joint effusion, fracture nor dislocations. Vascular calcifications are noted along the course of the popliteal and proximal tibial arteries. IMPRESSION: Mild degenerative femorotibial joint space narrowing and spurring. No acute fracture or joint dislocations. Electronically Signed   By: Ashley Royalty M.D.   On: 01/29/2017 19:51     PATHOLOGY:    ASSESSMENT AND PLAN:  Stage II by ISS, kappa multiple myeloma, in remission.  Dx in 09/2014 with 3 g proteinuria (Bence-Jones).  He was evaluated at [facility] for stem cell transplant, but was not felt to be an ideal candidate.  At time of relapse, I think it would be reasonable for the patient to be re-consulted for consideration of BMT with Dr. Norma Fredrickson at [facility].  -Lab work from 01/12/17 demonstrates that M spike is still not observed, no increase in kappa/lamda ratio. -Continue revlimid +dex.  -Reiterated with him that he needs to take his acyclovir while on revlimid for shingles prophylaxis. -Prescribed gabapentin for neuropathic pain from shingles. -Refilled his  omeprazole. -RTC in 3 months with labs below.  ORDERS PLACED FOR THIS ENCOUNTER: Orders Placed This Encounter  Procedures  . CBC with Differential  . Comprehensive metabolic panel  . Multiple Myeloma Panel (SPEP&IFE w/QIG)  . Kappa/lambda light chains  . Beta 2 microglobuline, serum    MEDICATIONS PRESCRIBED THIS ENCOUNTER: Meds ordered this encounter  Medications  . omeprazole (PRILOSEC) 40 MG capsule    Sig: Take 1 capsule (  40 mg total) by mouth daily.    Dispense:  60 capsule    Refill:  1  . gabapentin (NEURONTIN) 300 MG capsule    Sig: Take 1 capsule by mouth qHS the first week, if not too sleepy can increase to 1 capsule twice a day then second week.    Dispense:  60 capsule    Refill:  1    THERAPY PLAN:  Continue with Rev/Dex in the maintenance setting.  All questions were answered. The patient knows to call the clinic with any problems, questions or concerns. We can certainly see the patient much sooner if necessary.    This note is electronically signed by: Twana First, MD 02/18/2017 10:40 AM

## 2017-02-19 ENCOUNTER — Other Ambulatory Visit (HOSPITAL_COMMUNITY): Payer: Self-pay | Admitting: Emergency Medicine

## 2017-02-19 DIAGNOSIS — C9001 Multiple myeloma in remission: Secondary | ICD-10-CM

## 2017-02-19 MED ORDER — LENALIDOMIDE 5 MG PO CAPS: ORAL_CAPSULE | ORAL | 0 refills | Status: DC

## 2017-02-24 ENCOUNTER — Other Ambulatory Visit (HOSPITAL_COMMUNITY): Payer: Self-pay | Admitting: Oncology

## 2017-02-24 ENCOUNTER — Encounter (HOSPITAL_COMMUNITY): Payer: Self-pay

## 2017-02-27 ENCOUNTER — Other Ambulatory Visit: Payer: Self-pay | Admitting: Cardiovascular Disease

## 2017-02-27 ENCOUNTER — Other Ambulatory Visit (INDEPENDENT_AMBULATORY_CARE_PROVIDER_SITE_OTHER): Payer: Self-pay | Admitting: Internal Medicine

## 2017-02-27 ENCOUNTER — Other Ambulatory Visit (HOSPITAL_COMMUNITY): Payer: Self-pay

## 2017-02-27 ENCOUNTER — Other Ambulatory Visit (HOSPITAL_COMMUNITY): Payer: Self-pay | Admitting: Adult Health

## 2017-02-27 DIAGNOSIS — E876 Hypokalemia: Secondary | ICD-10-CM

## 2017-02-27 MED ORDER — POTASSIUM CHLORIDE CRYS ER 20 MEQ PO TBCR: 40.0000 meq | EXTENDED_RELEASE_TABLET | Freq: Two times a day (BID) | ORAL | 2 refills | Status: DC

## 2017-02-27 NOTE — Telephone Encounter (Signed)
Received refill request from patients pharmacy for potassium. Reviewed with provider, chart checked and refilled.  

## 2017-03-09 ENCOUNTER — Encounter (HOSPITAL_COMMUNITY): Payer: BLUE CROSS/BLUE SHIELD | Attending: Oncology

## 2017-03-09 ENCOUNTER — Encounter (HOSPITAL_COMMUNITY): Payer: Self-pay

## 2017-03-09 DIAGNOSIS — N183 Chronic kidney disease, stage 3 (moderate): Secondary | ICD-10-CM | POA: Insufficient documentation

## 2017-03-09 DIAGNOSIS — D649 Anemia, unspecified: Secondary | ICD-10-CM | POA: Insufficient documentation

## 2017-03-09 DIAGNOSIS — C9001 Multiple myeloma in remission: Secondary | ICD-10-CM | POA: Diagnosis not present

## 2017-03-09 DIAGNOSIS — G4730 Sleep apnea, unspecified: Secondary | ICD-10-CM | POA: Insufficient documentation

## 2017-03-09 DIAGNOSIS — I252 Old myocardial infarction: Secondary | ICD-10-CM | POA: Insufficient documentation

## 2017-03-09 DIAGNOSIS — K580 Irritable bowel syndrome with diarrhea: Secondary | ICD-10-CM | POA: Insufficient documentation

## 2017-03-09 DIAGNOSIS — E785 Hyperlipidemia, unspecified: Secondary | ICD-10-CM | POA: Insufficient documentation

## 2017-03-09 DIAGNOSIS — D801 Nonfamilial hypogammaglobulinemia: Secondary | ICD-10-CM | POA: Insufficient documentation

## 2017-03-09 DIAGNOSIS — I4892 Unspecified atrial flutter: Secondary | ICD-10-CM | POA: Insufficient documentation

## 2017-03-09 DIAGNOSIS — M109 Gout, unspecified: Secondary | ICD-10-CM | POA: Insufficient documentation

## 2017-03-09 DIAGNOSIS — E1165 Type 2 diabetes mellitus with hyperglycemia: Secondary | ICD-10-CM | POA: Insufficient documentation

## 2017-03-09 DIAGNOSIS — D696 Thrombocytopenia, unspecified: Secondary | ICD-10-CM | POA: Insufficient documentation

## 2017-03-09 DIAGNOSIS — I129 Hypertensive chronic kidney disease with stage 1 through stage 4 chronic kidney disease, or unspecified chronic kidney disease: Secondary | ICD-10-CM | POA: Insufficient documentation

## 2017-03-09 DIAGNOSIS — I2510 Atherosclerotic heart disease of native coronary artery without angina pectoris: Secondary | ICD-10-CM | POA: Insufficient documentation

## 2017-03-09 DIAGNOSIS — K219 Gastro-esophageal reflux disease without esophagitis: Secondary | ICD-10-CM | POA: Insufficient documentation

## 2017-03-09 DIAGNOSIS — Z23 Encounter for immunization: Secondary | ICD-10-CM

## 2017-03-09 DIAGNOSIS — E1122 Type 2 diabetes mellitus with diabetic chronic kidney disease: Secondary | ICD-10-CM | POA: Insufficient documentation

## 2017-03-09 DIAGNOSIS — M545 Low back pain: Secondary | ICD-10-CM | POA: Insufficient documentation

## 2017-03-09 DIAGNOSIS — Z87891 Personal history of nicotine dependence: Secondary | ICD-10-CM | POA: Insufficient documentation

## 2017-03-09 MED ORDER — INFLUENZA VAC SPLIT QUAD 0.5 ML IM SUSY
0.5000 mL | PREFILLED_SYRINGE | Freq: Once | INTRAMUSCULAR | Status: AC
  Administered 2017-03-09: 0.5 mL via INTRAMUSCULAR

## 2017-03-09 MED ORDER — HEPARIN SOD (PORK) LOCK FLUSH 100 UNIT/ML IV SOLN
INTRAVENOUS | Status: AC
Start: 2017-03-09 — End: ?
  Filled 2017-03-09: qty 5

## 2017-03-09 MED ORDER — INFLUENZA VAC SPLIT QUAD 0.5 ML IM SUSY
PREFILLED_SYRINGE | INTRAMUSCULAR | Status: AC
  Filled 2017-03-09: qty 0.5

## 2017-03-09 MED ORDER — HEPARIN SOD (PORK) LOCK FLUSH 100 UNIT/ML IV SOLN
500.0000 [IU] | Freq: Once | INTRAVENOUS | Status: AC
  Administered 2017-03-09: 500 [IU] via INTRAVENOUS

## 2017-03-09 MED ORDER — SODIUM CHLORIDE 0.9% FLUSH
10.0000 mL | INTRAVENOUS | Status: DC | PRN
  Administered 2017-03-09: 10 mL via INTRAVENOUS
  Filled 2017-03-09: qty 10

## 2017-03-09 NOTE — Patient Instructions (Signed)
[place] at [facility] Discharge Instructions  RECOMMENDATIONS MADE BY THE CONSULTANT AND ANY TEST RESULTS WILL BE SENT TO YOUR REFERRING PHYSICIAN.  Port flush and flu vaccine given today Follow up as scheduled.  Thank you for choosing [place] at [facility] to provide your oncology and hematology care.  To afford each patient quality time with our provider, please arrive at least 15 minutes before your scheduled appointment time.    If you have a lab appointment with the [place] please come in thru the  Main Entrance and check in at the main information desk  You need to re-schedule your appointment should you arrive 10 or more minutes late.  We strive to give you quality time with our providers, and arriving late affects you and other patients whose appointments are after yours.  Also, if you no show three or more times for appointments you may be dismissed from the clinic at the providers discretion.     Again, thank you for choosing [facility].  Our hope is that these requests will decrease the amount of time that you wait before being seen by our physicians.       _____________________________________________________________  Should you have questions after your visit to [facility], please contact our office at [phone] between the hours of 8:30 a.m. and 4:30 p.m.  Voicemails left after 4:30 p.m. will not be returned until the following business day.  For prescription refill requests, have your pharmacy contact our office.       Resources For Cancer Patients and their Caregivers ? American Cancer Society: Can assist with transportation, wigs, general needs, runs Look Good Feel Better.        ([phone] 507-1561 ? Cancer Care: Provides financial assistance, online support groups, medication/co-pay assistance.  1-800-813-HOPE 2231052691) ? [place] Assists [place]  cancer patients and their families through emotional , educational and financial support.  725-377-3042 ? [place] DSS Where to apply for food stamps, Medicaid and utility assistance. 806-508-1767 ? [facility]: Transportation to medical appointments. (415) 378-0769 ? Social Security Administration: May apply for disability if have a Stage IV cancer. 219-887-6171 (727)695-7190 ? [employer], Disability and Transit Services: Assists with nutrition, care and transit needs. [place] Support Programs: @10RELATIVEDAYS @ > Cancer Support Group  2nd Tuesday of the month 1pm-2pm, Journey Room  > Creative Journey  3rd Tuesday of the month 1130am-1pm, Journey Room  > Look Good Feel Better  1st Wednesday of the month 10am-12 noon, Journey Room (Call [place] to register [phone])

## 2017-03-09 NOTE — Progress Notes (Signed)
[name] presents today for injection per MD orders. Fluarix 0.27ml administered IM in left Upper Arm. Administration without incident. Patient tolerated well.  [name] presented for Portacath access and flush. Portacath located right chest wall accessed with  H 20 needle. Good blood return present. Portacath flushed with 18ml NS and 500U/29ml Heparin and needle removed intact. Procedure without incident. Patient tolerated procedure well.  Treatment given per orders. Patient tolerated it well without problems. Vitals stable and discharged home from clinic via wheelchair. Follow up as scheduled.

## 2017-03-16 ENCOUNTER — Other Ambulatory Visit (HOSPITAL_COMMUNITY): Payer: Self-pay | Admitting: Adult Health

## 2017-03-23 ENCOUNTER — Other Ambulatory Visit (HOSPITAL_COMMUNITY): Payer: Self-pay | Admitting: Emergency Medicine

## 2017-03-23 DIAGNOSIS — C9001 Multiple myeloma in remission: Secondary | ICD-10-CM

## 2017-03-23 MED ORDER — LENALIDOMIDE 5 MG PO CAPS: ORAL_CAPSULE | ORAL | 0 refills | Status: DC

## 2017-03-23 NOTE — Progress Notes (Unsigned)
revlimid refilled

## 2017-03-24 ENCOUNTER — Other Ambulatory Visit (HOSPITAL_COMMUNITY): Payer: Self-pay | Admitting: Adult Health

## 2017-03-25 ENCOUNTER — Other Ambulatory Visit: Payer: Self-pay | Admitting: Podiatry

## 2017-03-28 ENCOUNTER — Other Ambulatory Visit: Payer: Self-pay | Admitting: Cardiovascular Disease

## 2017-03-31 NOTE — Patient Instructions (Signed)
[name]  03/31/2017     @PREFPERIOPPHARMACY @   Your procedure is scheduled on  04/08/2017   Report to [name] at  615   A.M.  Call this number if you have problems the morning of surgery:  [phone]   Remember:  Do not eat food or drink liquids after midnight.  Take these medicines the morning of surgery with A SIP OF WATER  Allopurinol, codeine or hydrocodone or oxycodone, colchicine, neurontin, lisinopril, claritin, toprol XL, prilosec. Take 1/2 of usual levimir dosage the morning of your procedure. DO NOT take any Humalog the morning of surgery unless you glucose is over 220, then only take 1/2 of your correction dosage. If glucose is less than 70, drink 1/2 cup clear juice (cranberry or apple), 4 glucose tablets or glucose gel. Recheck your sugar in 15 minutes.   Do not wear jewelry, make-up or nail polish.  Do not wear lotions, powders, or perfumes, or deoderant.  Do not shave 48 hours prior to surgery.  Men may shave face and neck.  Do not bring valuables to the hospital.  [name] is not responsible for any belongings or valuables.  Contacts, dentures or bridgework may not be worn into surgery.  Leave your suitcase in the car.  After surgery it may be brought to your room.  For patients admitted to the hospital, discharge time will be determined by your treatment team.  Patients discharged the day of surgery will not be allowed to drive home.   Name and phone number of your driver:   family Special instructions:  None  Please read over the following fact sheets that you were given. Anesthesia Post-op Instructions and Care and Recovery After Surgery        Ostectomy of the Foot Ostectomy of the foot is a surgery to remove part of a bone in the foot. You may need this procedure if:  You have an abnormal bone growth called a bone spur (osteophyte) in your foot.  Bones in your foot are not lined up with each other  (misalignment).  The procedure may be done if one of these conditions is causing pain or limiting movement. It is usually done when other treatments have not helped. During an ostectomy, part of the bone is removed to shorten or reshape the bone. Tell a health care provider about:  Any allergies you have.  All medicines you are taking, including vitamins, herbs, eye drops, creams, and over-the-counter medicines.  Any problems you or family members have had with anesthetic medicines.  Any blood disorders you have.  Any surgeries you have had.  Any medical conditions you have.  Whether you are pregnant or may be pregnant. What are the risks? Generally, this is a safe procedure. However, problems may occur, including:  Infection.  Bleeding.  Pain.  Stiffness.  Numbness.  Allergic reactions to medicines.  Damage to nerves or blood vessels in the foot.  A blood clot that forms in the leg and travels to the lung.  Failure of the bone to heal.  The abnormal bone growth coming back again (recurrence).  What happens before the procedure? Staying hydrated Follow instructions from your health care provider about hydration, which may include:  Up to 2 hours before the procedure - you may continue to drink clear liquids, such as water, clear fruit juice, black coffee, and plain tea.  Eating and drinking restrictions Follow instructions from your  health care provider about eating and drinking, which may include:  8 hours before the procedure - stop eating heavy meals or foods such as meat, fried foods, or fatty foods.  6 hours before the procedure - stop eating light meals or foods, such as toast or cereal.  6 hours before the procedure - stop drinking milk or drinks that contain milk.  2 hours before the procedure - stop drinking clear liquids.  Medicines  Ask your health care provider about: ? Changing or stopping your regular medicines. This is especially important  if you are taking diabetes medicines or blood thinners. ? Taking medicines such as aspirin and ibuprofen. These medicines can thin your blood. Do not take these medicines before your procedure if your health care provider instructs you not to.  You may be given antibiotic medicine to help prevent infection. General instructions  You may have foot X-rays done while you are standing on your foot. This allows the surgeon to clearly see the abnormal bone growth in your foot.  Ask your health care provider how your surgical site will be marked or identified.  You may be asked to shower with a germ-killing soap.  Plan to have a responsible adult care for you for at least 24 hours after you leave the hospital or clinic. This is important. What happens during the procedure?  To lower your risk of infection: ? Your health care team will wash or sanitize their hands. ? Your skin will be washed with soap. ? Hair may be removed from the surgical area.  You will be given one or more of the following: ? A medicine to help you relax (sedative). ? A medicine to numb the area (local anesthetic). ? A medicine to make you fall asleep (general anesthetic). ? A medicine that is injected into your spine to numb the area below and slightly above the injection site (spinal anesthetic). ? A medicine that is injected into an area of your body to numb everything below the injection site (regional anesthetic).  An incision will be made in your foot.  Tissues, nerves, and blood vessels near the bone will be moved out of the way.  The spur or the part of bone that needs to be removed will be cut away using a bone saw or a bone shaving instrument.  The remaining bone may be secured with screws, wires, or plates.  The skin incision will be closed with stitches (sutures) or another type of skin closure.  A bandage (dressing) will be placed over the incision.  A soft wrap may be placed around your foot. The  procedure may vary among health care providers and hospitals. What happens after the procedure?  Your blood pressure, heart rate, breathing rate, and blood oxygen level will be monitored until the medicines you were given have worn off.  Your foot may be placed in a protective shoe, a splint, or a cast.  You may be given crutches or a walker to help you walk without putting weight (bearing weight) on your foot.  Do not drive for 24 hours if you were given a sedative. Summary  Ostectomy of the foot is a surgery to remove part of a bone in the foot.  You may need this procedure if you have an abnormal bone growth called a bone spur (osteophyte) or if bones in your foot are not lined up with each other (misalignment). The procedure may be done if you have pain or limited movement and  other treatments have not helped.  Follow instructions from your health care provider about eating and drinking before the procedure.  After the procedure, your foot may be placed in a protective shoe, a splint, or a cast. You may be given crutches or a walker to help you walk without putting weight (bearing weight) on your foot. This information is not intended to replace advice given to you by your health care provider. Make sure you discuss any questions you have with your health care provider. Document Released: 07/08/2016 Document Revised: 07/08/2016 Document Reviewed: 07/08/2016 Elsevier Interactive Patient Education  2018 [place] After This sheet gives you information about how to care for yourself after your procedure. Your health care provider may also give you more specific instructions. If you have problems or questions, contact your health care provider. What can I expect after the procedure? After the procedure, it is common to have:  Pain.  Swelling.  Stiffness.  Follow these instructions at home: If you have a splint or supportive shoe:  Wear the splint  or shoe as told by your health care provider. Remove it only as told by your health care provider.  Loosen the splint or shoe if your toes tingle, become numb, or turn cold and blue.  Keep the splint or shoe clean.  If the splint or shoe is not waterproof: ? Do not let it get wet. ? Cover it with a watertight covering when you take a bath or a shower. If you have a cast:  Do not stick anything inside the cast to scratch your skin. Doing that increases your risk of infection.  Check the skin around the cast every day. Tell your health care provider about any concerns.  You may put lotion on dry skin around the edges of the cast. Do not put lotion on the skin underneath the cast.  Keep the cast clean.  If the cast is not waterproof: ? Do not let it get wet. ? Cover it with a watertight covering when you take a bath or a shower. Bathing  Do not take baths, swim, or use a hot tub until your health care provider approves. Ask your health care provider if you may take showers. You may only be allowed to take sponge baths for bathing.  If your cast or splint is not waterproof, cover it with a watertight covering when you take a bath or a shower.  Keep the bandage (dressing) dry until your health care provider says it can be removed. Incision care  Follow instructions from your health care provider about how to take care of your incision. Make sure you: ? Wash your hands with soap and water before you change your bandage (dressing). If soap and water are not available, use hand sanitizer. ? Change your dressing as told by your health care provider. ? Leave stitches (sutures), skin glue, or adhesive strips in place. These skin closures may need to stay in place for 2 weeks or longer. If adhesive strip edges start to loosen and curl up, you may trim the loose edges. Do not remove adhesive strips completely unless your health care provider tells you to do that.  Check your incision area  every day for signs of infection. Check for: ? Redness, swelling, or pain. ? Fluid or blood. ? Warmth. ? Pus or a bad smell. Managing pain, stiffness, and swelling  If directed, put ice on the affected area. ? If you have  a removable splint or shoe, remove it as told by your health care provider. ? Put ice in a plastic bag. ? Place a towel between your skin and the bag or between your cast and the bag. ? Leave the ice on for 20 minutes, 2-3 times a day.  Move your foot and toes often to avoid stiffness and to lessen swelling.  Raise (elevate) your foot area above the level of your heart while you are sitting or lying down. Driving  Do not drive or use heavy machinery while taking prescription pain medicine.  Ask your health care provider when it is safe to drive if you have a cast, splint, or supportive shoe on your foot. Activity  Return to your normal activities as told by your health care provider. Ask your health care provider what activities are safe for you.  Do exercises as told by your health care provider. Safety  Do not use your foot to support your body weight until your health care provider says that you can.  Use your crutches or walker as told by your health care provider. General instructions  Do not put pressure on any part of the cast or splint until it is fully hardened. This may take several hours.  Do not use any products that contain nicotine or tobacco, such as cigarettes and e-cigarettes. These can delay bone healing. If you need help quitting, ask your health care provider.  Take over-the-counter and prescription medicines only as told by your health care provider.  Keep all follow-up visits as told by your health care provider. This is important. Contact a health care provider if:  You have chills or a fever.  Your pain is not controlled by your pain medicine.  You have redness, swelling, or pain around your incision.  You have fluid or blood  coming from your incision.  Your incision feels warm to the touch.  You have pus or a bad smell coming from your incision. Get help right away if:  You have severe pain.  You have new pain, warmth, and swelling in your leg.  You have chest pain or difficulty breathing. Summary  After the procedure, it is common to have some pain, swelling, and stiffness.  Follow instructions from your health care provider about how to take care of your incision. Check the incision area every day for signs of infection.  Do not use your foot to support your body weight until your health care provider says that you can.  Move your foot and toes often to avoid stiffness and to lessen swelling. This information is not intended to replace advice given to you by your health care provider. Make sure you discuss any questions you have with your health care provider. Document Released: 07/08/2016 Document Revised: 07/08/2016 Document Reviewed: 07/08/2016 Elsevier Interactive Patient Education  2018 [place] Anesthesia is a term that refers to techniques, procedures, and medicines that help a person stay safe and comfortable during a medical procedure. Monitored anesthesia care, or sedation, is one type of anesthesia. Your anesthesia specialist may recommend sedation if you will be having a procedure that does not require you to be unconscious, such as:  Cataract surgery.  A dental procedure.  A biopsy.  A colonoscopy.  During the procedure, you may receive a medicine to help you relax (sedative). There are three levels of sedation:  Mild sedation. At this level, you may feel awake and relaxed. You will be able to follow  directions.  Moderate sedation. At this level, you will be sleepy. You may not remember the procedure.  Deep sedation. At this level, you will be asleep. You will not remember the procedure.  The more medicine you are given, the deeper your level of  sedation will be. Depending on how you respond to the procedure, the anesthesia specialist may change your level of sedation or the type of anesthesia to fit your needs. An anesthesia specialist will monitor you closely during the procedure. Let your health care provider know about:  Any allergies you have.  All medicines you are taking, including vitamins, herbs, eye drops, creams, and over-the-counter medicines.  Any use of steroids (by mouth or as a cream).  Any problems you or family members have had with sedatives and anesthetic medicines.  Any blood disorders you have.  Any surgeries you have had.  Any medical conditions you have, such as sleep apnea.  Whether you are pregnant or may be pregnant.  Any use of cigarettes, alcohol, or street drugs. What are the risks? Generally, this is a safe procedure. However, problems may occur, including:  Getting too much medicine (oversedation).  Nausea.  Allergic reaction to medicines.  Trouble breathing. If this happens, a breathing tube may be used to help with breathing. It will be removed when you are awake and breathing on your own.  Heart trouble.  Lung trouble.  Before the procedure Staying hydrated Follow instructions from your health care provider about hydration, which may include:  Up to 2 hours before the procedure - you may continue to drink clear liquids, such as water, clear fruit juice, black coffee, and plain tea.  Eating and drinking restrictions Follow instructions from your health care provider about eating and drinking, which may include:  8 hours before the procedure - stop eating heavy meals or foods such as meat, fried foods, or fatty foods.  6 hours before the procedure - stop eating light meals or foods, such as toast or cereal.  6 hours before the procedure - stop drinking milk or drinks that contain milk.  2 hours before the procedure - stop drinking clear liquids.  Medicines Ask your health  care provider about:  Changing or stopping your regular medicines. This is especially important if you are taking diabetes medicines or blood thinners.  Taking medicines such as aspirin and ibuprofen. These medicines can thin your blood. Do not take these medicines before your procedure if your health care provider instructs you not to.  Tests and exams  You will have a physical exam.  You may have blood tests done to show: ? How well your kidneys and liver are working. ? How well your blood can clot.  General instructions  Plan to have someone take you home from the hospital or clinic.  If you will be going home right after the procedure, plan to have someone with you for 24 hours.  What happens during the procedure?  Your blood pressure, heart rate, breathing, level of pain and overall condition will be monitored.  An IV tube will be inserted into one of your veins.  Your anesthesia specialist will give you medicines as needed to keep you comfortable during the procedure. This may mean changing the level of sedation.  The procedure will be performed. After the procedure  Your blood pressure, heart rate, breathing rate, and blood oxygen level will be monitored until the medicines you were given have worn off.  Do not drive for 24 hours  if you received a sedative.  You may: ? Feel sleepy, clumsy, or nauseous. ? Feel forgetful about what happened after the procedure. ? Have a sore throat if you had a breathing tube during the procedure. ? Vomit. This information is not intended to replace advice given to you by your health care provider. Make sure you discuss any questions you have with your health care provider. Document Released: 02/12/2005 Document Revised: 10/26/2015 Document Reviewed: 09/09/2015 Elsevier Interactive Patient Education  2018 [place], Care After These instructions provide you with information about caring for yourself  after your procedure. Your health care provider may also give you more specific instructions. Your treatment has been planned according to current medical practices, but problems sometimes occur. Call your health care provider if you have any problems or questions after your procedure. What can I expect after the procedure? After your procedure, it is common to:  Feel sleepy for several hours.  Feel clumsy and have poor balance for several hours.  Feel forgetful about what happened after the procedure.  Have poor judgment for several hours.  Feel nauseous or vomit.  Have a sore throat if you had a breathing tube during the procedure.  Follow these instructions at home: For at least 24 hours after the procedure:   Do not: ? Participate in activities in which you could fall or become injured. ? Drive. ? Use heavy machinery. ? Drink alcohol. ? Take sleeping pills or medicines that cause drowsiness. ? Make important decisions or sign legal documents. ? Take care of children on your own.  Rest. Eating and drinking  Follow the diet that is recommended by your health care provider.  If you vomit, drink water, juice, or soup when you can drink without vomiting.  Make sure you have little or no nausea before eating solid foods. General instructions  Have a responsible adult stay with you until you are awake and alert.  Take over-the-counter and prescription medicines only as told by your health care provider.  If you smoke, do not smoke without supervision.  Keep all follow-up visits as told by your health care provider. This is important. Contact a health care provider if:  You keep feeling nauseous or you keep vomiting.  You feel light-headed.  You develop a rash.  You have a fever. Get help right away if:  You have trouble breathing. This information is not intended to replace advice given to you by your health care provider. Make sure you discuss any questions you  have with your health care provider. Document Released: 09/09/2015 Document Revised: 01/09/2016 Document Reviewed: 09/09/2015 Elsevier Interactive Patient Education  [employer].

## 2017-04-02 ENCOUNTER — Encounter (HOSPITAL_COMMUNITY)
Admission: RE | Admit: 2017-04-02 | Discharge: 2017-04-02 | Disposition: A | Discharge status: Home / Self Care | Payer: BLUE CROSS/BLUE SHIELD | Source: Ambulatory Visit | Attending: Podiatry | Admitting: Podiatry

## 2017-04-02 ENCOUNTER — Encounter (HOSPITAL_COMMUNITY): Payer: Self-pay

## 2017-04-02 ENCOUNTER — Ambulatory Visit (HOSPITAL_COMMUNITY)
Admission: RE | Admit: 2017-04-02 | Discharge: 2017-04-02 | Disposition: A | Discharge status: Home / Self Care | Payer: BLUE CROSS/BLUE SHIELD | Source: Ambulatory Visit | Attending: Podiatry | Admitting: Podiatry

## 2017-04-02 DIAGNOSIS — M7731 Calcaneal spur, right foot: Secondary | ICD-10-CM | POA: Insufficient documentation

## 2017-04-02 DIAGNOSIS — L0291 Cutaneous abscess, unspecified: Secondary | ICD-10-CM

## 2017-04-02 DIAGNOSIS — M7989 Other specified soft tissue disorders: Secondary | ICD-10-CM | POA: Diagnosis not present

## 2017-04-02 DIAGNOSIS — E11621 Type 2 diabetes mellitus with foot ulcer: Secondary | ICD-10-CM | POA: Insufficient documentation

## 2017-04-02 DIAGNOSIS — L97512 Non-pressure chronic ulcer of other part of right foot with fat layer exposed: Secondary | ICD-10-CM | POA: Insufficient documentation

## 2017-04-02 DIAGNOSIS — Z01818 Encounter for other preprocedural examination: Secondary | ICD-10-CM | POA: Diagnosis not present

## 2017-04-02 HISTORY — DX: Unspecified osteoarthritis, unspecified site: M19.90

## 2017-04-02 HISTORY — DX: Anemia, unspecified: D64.9

## 2017-04-02 HISTORY — DX: Cutaneous abscess, unspecified: L02.91

## 2017-04-02 HISTORY — DX: Cardiac arrhythmia, unspecified: I49.9

## 2017-04-02 LAB — COMPREHENSIVE METABOLIC PANEL
ALT: 29 U/L (ref 17–63)
AST: 29 U/L (ref 15–41)
Albumin: 2.7 g/dL — ABNORMAL LOW (ref 3.5–5.0)
Alkaline Phosphatase: 78 U/L (ref 38–126)
Anion gap: 9 (ref 5–15)
BUN: 30 mg/dL — ABNORMAL HIGH (ref 6–20)
CO2: 22 mmol/L (ref 22–32)
Calcium: 8.2 mg/dL — ABNORMAL LOW (ref 8.9–10.3)
Chloride: 110 mmol/L (ref 101–111)
Creatinine, Ser: 1.79 mg/dL — ABNORMAL HIGH (ref 0.61–1.24)
GFR calc Af Amer: 46 mL/min — ABNORMAL LOW (ref >60)
GFR calc non Af Amer: 40 mL/min — ABNORMAL LOW (ref >60)
Glucose, Bld: 112 mg/dL — ABNORMAL HIGH (ref 65–99)
Potassium: 4 mmol/L (ref 3.5–5.1)
Sodium: 141 mmol/L (ref 135–145)
Total Bilirubin: 0.5 mg/dL (ref 0.3–1.2)
Total Protein: 5.2 g/dL — ABNORMAL LOW (ref 6.5–8.1)

## 2017-04-02 LAB — CBC WITH DIFFERENTIAL/PLATELET
Basophils Absolute: 0 10*3/uL (ref 0.0–0.1)
Basophils Relative: 0 %
Eosinophils Absolute: 0.2 10*3/uL (ref 0.0–0.7)
Eosinophils Relative: 2 %
HCT: 27.7 % — ABNORMAL LOW (ref 39.0–52.0)
Hemoglobin: 8.4 g/dL — ABNORMAL LOW (ref 13.0–17.0)
Lymphocytes Relative: 24 %
Lymphs Abs: 1.9 10*3/uL (ref 0.7–4.0)
MCH: 30 pg (ref 26.0–34.0)
MCHC: 30.3 g/dL (ref 30.0–36.0)
MCV: 98.9 fL (ref 78.0–100.0)
Monocytes Absolute: 1.4 10*3/uL — ABNORMAL HIGH (ref 0.1–1.0)
Monocytes Relative: 18 %
Neutro Abs: 4.4 10*3/uL (ref 1.7–7.7)
Neutrophils Relative %: 56 %
Platelets: 172 10*3/uL (ref 150–400)
RBC: 2.8 MIL/uL — ABNORMAL LOW (ref 4.22–5.81)
RDW: 16.4 % — ABNORMAL HIGH (ref 11.5–15.5)
WBC: 7.9 10*3/uL (ref 4.0–10.5)

## 2017-04-02 LAB — HEMOGLOBIN A1C
Hgb A1c MFr Bld: 8.2 % — ABNORMAL HIGH (ref 4.8–5.6)
Mean Plasma Glucose: 188.64 mg/dL

## 2017-04-02 LAB — GLUCOSE, CAPILLARY: Glucose-Capillary: 114 mg/dL — ABNORMAL HIGH (ref 65–99)

## 2017-04-03 ENCOUNTER — Encounter (HOSPITAL_COMMUNITY): Payer: Self-pay

## 2017-04-03 NOTE — Pre-Procedure Instructions (Signed)
Dr Patsey Berthold aware of Labs. No orders given.

## 2017-04-03 NOTE — Pre-Procedure Instructions (Signed)
Labs routed to PCP.

## 2017-04-07 ENCOUNTER — Other Ambulatory Visit (HOSPITAL_COMMUNITY): Payer: Self-pay | Admitting: Adult Health

## 2017-04-08 ENCOUNTER — Ambulatory Visit (HOSPITAL_COMMUNITY)
Admission: RE | Admit: 2017-04-08 | Discharge: 2017-04-08 | Disposition: A | Discharge status: Home / Self Care | Payer: BLUE CROSS/BLUE SHIELD | Source: Ambulatory Visit | Attending: Podiatry | Admitting: Podiatry

## 2017-04-08 ENCOUNTER — Ambulatory Visit (HOSPITAL_COMMUNITY): Payer: BLUE CROSS/BLUE SHIELD | Admitting: Anesthesiology

## 2017-04-08 ENCOUNTER — Ambulatory Visit (HOSPITAL_COMMUNITY): Payer: BLUE CROSS/BLUE SHIELD

## 2017-04-08 ENCOUNTER — Encounter (HOSPITAL_COMMUNITY): Payer: Self-pay | Admitting: *Deleted

## 2017-04-08 ENCOUNTER — Encounter (HOSPITAL_COMMUNITY)
Admission: RE | Disposition: A | Discharge status: Home / Self Care | Payer: Self-pay | Source: Ambulatory Visit | Attending: Podiatry

## 2017-04-08 DIAGNOSIS — Z79899 Other long term (current) drug therapy: Secondary | ICD-10-CM | POA: Insufficient documentation

## 2017-04-08 DIAGNOSIS — E1142 Type 2 diabetes mellitus with diabetic polyneuropathy: Secondary | ICD-10-CM | POA: Insufficient documentation

## 2017-04-08 DIAGNOSIS — I252 Old myocardial infarction: Secondary | ICD-10-CM | POA: Insufficient documentation

## 2017-04-08 DIAGNOSIS — I10 Essential (primary) hypertension: Secondary | ICD-10-CM | POA: Diagnosis not present

## 2017-04-08 DIAGNOSIS — L97519 Non-pressure chronic ulcer of other part of right foot with unspecified severity: Secondary | ICD-10-CM | POA: Diagnosis not present

## 2017-04-08 DIAGNOSIS — K219 Gastro-esophageal reflux disease without esophagitis: Secondary | ICD-10-CM | POA: Insufficient documentation

## 2017-04-08 DIAGNOSIS — G4730 Sleep apnea, unspecified: Secondary | ICD-10-CM | POA: Insufficient documentation

## 2017-04-08 DIAGNOSIS — E11621 Type 2 diabetes mellitus with foot ulcer: Secondary | ICD-10-CM | POA: Diagnosis not present

## 2017-04-08 DIAGNOSIS — Z9889 Other specified postprocedural states: Secondary | ICD-10-CM

## 2017-04-08 HISTORY — PX: OSTECTOMY: SHX6439

## 2017-04-08 HISTORY — PX: WOUND DEBRIDEMENT: SHX247

## 2017-04-08 LAB — GLUCOSE, CAPILLARY
Glucose-Capillary: 167 mg/dL — ABNORMAL HIGH (ref 65–99)
Glucose-Capillary: 167 mg/dL — ABNORMAL HIGH (ref 65–99)

## 2017-04-08 SURGERY — DEBRIDEMENT, WOUND
Anesthesia: Monitor Anesthesia Care | Site: Toe | Laterality: Right

## 2017-04-08 MED ORDER — CHLORHEXIDINE GLUCONATE CLOTH 2 % EX PADS: 6.0000 | MEDICATED_PAD | Freq: Once | CUTANEOUS | Status: DC

## 2017-04-08 MED ORDER — CEFAZOLIN SODIUM-DEXTROSE 1-4 GM/50ML-% IV SOLN
INTRAVENOUS | Status: AC
  Filled 2017-04-08: qty 50

## 2017-04-08 MED ORDER — CEFAZOLIN SODIUM-DEXTROSE 2-4 GM/100ML-% IV SOLN
INTRAVENOUS | Status: AC
  Filled 2017-04-08: qty 100

## 2017-04-08 MED ORDER — PROPOFOL 10 MG/ML IV BOLUS
INTRAVENOUS | Status: AC
  Filled 2017-04-08: qty 20

## 2017-04-08 MED ORDER — FENTANYL CITRATE (PF) 100 MCG/2ML IJ SOLN
INTRAMUSCULAR | Status: AC
  Filled 2017-04-08: qty 2

## 2017-04-08 MED ORDER — ONDANSETRON 4 MG PO TBDP
4.0000 mg | ORAL_TABLET | Freq: Once | ORAL | Status: AC
  Administered 2017-04-08: 4 mg via ORAL
  Filled 2017-04-08: qty 1

## 2017-04-08 MED ORDER — PROPOFOL 500 MG/50ML IV EMUL
INTRAVENOUS | Status: DC | PRN
  Administered 2017-04-08: 25 ug/kg/min via INTRAVENOUS

## 2017-04-08 MED ORDER — BUPIVACAINE HCL (PF) 0.5 % IJ SOLN
INTRAMUSCULAR | Status: DC | PRN
  Administered 2017-04-08: 8 mL

## 2017-04-08 MED ORDER — MIDAZOLAM HCL 5 MG/5ML IJ SOLN
INTRAMUSCULAR | Status: DC | PRN
  Administered 2017-04-08: 1 mg via INTRAVENOUS

## 2017-04-08 MED ORDER — LIDOCAINE HCL (CARDIAC) 20 MG/ML IV SOLN
INTRAVENOUS | Status: DC | PRN
  Administered 2017-04-08: 40 mg via INTRATRACHEAL

## 2017-04-08 MED ORDER — BUPIVACAINE HCL (PF) 0.5 % IJ SOLN
INTRAMUSCULAR | Status: AC
  Filled 2017-04-08: qty 30

## 2017-04-08 MED ORDER — PROPOFOL 10 MG/ML IV BOLUS
INTRAVENOUS | Status: DC | PRN
  Administered 2017-04-08: 15 mg via INTRAVENOUS

## 2017-04-08 MED ORDER — MIDAZOLAM HCL 2 MG/2ML IJ SOLN
INTRAMUSCULAR | Status: AC
  Filled 2017-04-08: qty 2

## 2017-04-08 MED ORDER — DEXTROSE 5 % IV SOLN
3.0000 g | INTRAVENOUS | Status: AC
  Administered 2017-04-08: 3 g via INTRAVENOUS
  Filled 2017-04-08: qty 3000

## 2017-04-08 MED ORDER — 0.9 % SODIUM CHLORIDE (POUR BTL) OPTIME
TOPICAL | Status: DC | PRN
  Administered 2017-04-08: 1000 mL

## 2017-04-08 MED ORDER — LACTATED RINGERS IV SOLN
INTRAVENOUS | Status: DC
  Administered 2017-04-08: 07:00:00 via INTRAVENOUS
  Administered 2017-04-08: 1000 mL via INTRAVENOUS

## 2017-04-08 SURGICAL SUPPLY — 34 items
BAG HAMPER (MISCELLANEOUS) ×2 IMPLANT
BANDAGE ELASTIC 4 LF NS (GAUZE/BANDAGES/DRESSINGS) ×1 IMPLANT
BANDAGE ESMARK 4X12 BL STRL LF (DISPOSABLE) IMPLANT
BLADE AVERAGE 25X9 (BLADE) ×2 IMPLANT
BNDG CMPR 12X4 ELC STRL LF (DISPOSABLE)
BNDG CMPR MED 5X4 ELC HKLP NS (GAUZE/BANDAGES/DRESSINGS) ×1
BNDG CONFORM 2 STRL LF (GAUZE/BANDAGES/DRESSINGS) ×2 IMPLANT
BNDG ESMARK 4X12 BLUE STRL LF (DISPOSABLE)
BNDG GAUZE ELAST 4 BULKY (GAUZE/BANDAGES/DRESSINGS) ×2 IMPLANT
CLOTH BEACON ORANGE TIMEOUT ST (SAFETY) ×2 IMPLANT
COVER LIGHT HANDLE STERIS (MISCELLANEOUS) ×4 IMPLANT
CUFF TOURNIQUET SINGLE 18IN (TOURNIQUET CUFF) ×2 IMPLANT
DECANTER SPIKE VIAL GLASS SM (MISCELLANEOUS) ×2 IMPLANT
DRSG ADAPTIC 3X8 NADH LF (GAUZE/BANDAGES/DRESSINGS) ×2 IMPLANT
ELECT REM PT RETURN 9FT ADLT (ELECTROSURGICAL) ×2
ELECTRODE REM PT RTRN 9FT ADLT (ELECTROSURGICAL) ×1 IMPLANT
GAUZE SPONGE 4X4 12PLY STRL (GAUZE/BANDAGES/DRESSINGS) ×2 IMPLANT
GLOVE BIO SURGEON STRL SZ7.5 (GLOVE) IMPLANT
GLOVE BIOGEL M 7.0 STRL (GLOVE) ×2 IMPLANT
GLOVE BIOGEL PI IND STRL 7.0 (GLOVE) ×1 IMPLANT
GLOVE BIOGEL PI INDICATOR 7.0 (GLOVE) ×1
GOWN STRL REUS W/TWL LRG LVL3 (GOWN DISPOSABLE) ×6 IMPLANT
KIT ROOM TURNOVER AP CYSTO (KITS) ×2 IMPLANT
MANIFOLD NEPTUNE II (INSTRUMENTS) ×2 IMPLANT
NEEDLE HYPO 27GX1-1/4 (NEEDLE) ×4 IMPLANT
NS IRRIG 1000ML POUR BTL (IV SOLUTION) ×2 IMPLANT
PACK BASIC LIMB (CUSTOM PROCEDURE TRAY) ×2 IMPLANT
PAD ARMBOARD 7.5X6 YLW CONV (MISCELLANEOUS) ×2 IMPLANT
RASP SM TEAR CROSS CUT (RASP) IMPLANT
SET BASIN LINEN APH (SET/KITS/TRAYS/PACK) ×2 IMPLANT
SPONGE LAP 18X18 X RAY DECT (DISPOSABLE) ×2 IMPLANT
SUT ETHILON 4 0 PS 2 18 (SUTURE) ×4 IMPLANT
SUT VIC AB 4-0 PS2 27 (SUTURE) ×2 IMPLANT
SYR CONTROL 10ML LL (SYRINGE) ×4 IMPLANT

## 2017-04-08 NOTE — Brief Op Note (Signed)
BRIEF OPERATIVE NOTE  DATE OF PROCEDURE 04/08/2017  SURGEON Marcheta Grammes, DPM  ASSISTANT SURGEON None.  OR STAFF Circulator: Towanda Malkin, RN Scrub Person: Karin Lieu, CST   PREOPERATIVE DIAGNOSIS 1.  Chronic ulceration of right great toe 2.  Diabetes mellitus with peripheral neuropathy  POSTOPERATIVE DIAGNOSIS Same  PROCEDURE 1.  Excision of ulceration of right great toe 2.  Ostectomy of right great toe  ANESTHESIA Monitor Anesthesia Care   HEMOSTASIS Pneumatic ankle tourniquet applied but never inflated.  ESTIMATED BLOOD LOSS <25 cc  MATERIALS USED None  INJECTABLES 0.5% Marcaine plain  PATHOLOGY Bone from distal phalanx  COMPLICATIONS None

## 2017-04-08 NOTE — Anesthesia Postprocedure Evaluation (Signed)
Anesthesia Post Note  Patient: [name]  Procedure(s) Performed: EXCISION ULCERATION RIGHT GREAT TOE (Right Toe) OSTECTOMY RIGHT GREAT TOE (Right Toe)  Patient location during evaluation: PACU Anesthesia Type: MAC Level of consciousness: awake and alert, oriented and patient cooperative Pain management: pain level controlled Vital Signs Assessment: post-procedure vital signs reviewed and stable Respiratory status: spontaneous breathing and respiratory function stable Cardiovascular status: blood pressure returned to baseline and stable Postop Assessment: no headache, adequate PO intake and no apparent nausea or vomiting Anesthetic complications: no     Last Vitals:  Vitals:   04/08/17 0715 04/08/17 0730  BP: (!) 96/58 101/66  Resp: 19 15  Temp:    SpO2: 94% 97%    Last Pain:  Vitals:   04/08/17 0646  TempSrc: Oral  PainSc: 6                  Kjirsten Bloodgood

## 2017-04-08 NOTE — Discharge Instructions (Signed)
These instructions will give you an idea of what to expect after surgery and how to manage issues that may arise before your first post op office visit.  Pain Management Pain is best managed by "staying ahead" of it. If pain gets out of control, it is difficult to get it back under control. Local anesthesia that lasts 6-8 hours is used to numb the foot and decrease pain.  For the best pain control, take the pain medication every 4 hours for the first 2 days post op. On the third day pain medication can be taken as needed.   Post Op Nausea Nausea is common after surgery, so it is managed proactively.  If prescribed, use the prescribed nausea medication regularly for the first 2 days post op.  Bandages Do not worry if there is blood on the bandage. What looks like a lot of blood on the bandage is actually a small amount. Blood on the dressing spreads out as it is absorbed by the gauze, the same way a drop of water spreads out on a paper towel.  If the bandages feel wet or dry, stiff and uncomfortable, call the office during office hours and we will schedule a time for you to have the bandage changed.  Unless you are specifically told otherwise, we will do the first bandage change in the office.  Keep your bandage dry. If the bandage becomes wet or soiled, notify the office and we will schedule a time to change the bandage.  Activity It is best to spend most of the first 2 days after surgery lying down with the foot elevated above the level of your heart. You may put weight on your heel while wearing the surgical shoe.   You may only get up to go to the restroom.  Driving Do not drive until you are able to respond in an emergency (i.e. slam on the brakes). This usually occurs after the bone has healed - 6 to 8 weeks.  Call the Office If you have a fever over 101F.  If you have increasing pain after the initial post op pain has settled down.  If you have increasing redness, swelling, or  drainage.  If you have any questions or concerns.   

## 2017-04-08 NOTE — Anesthesia Preprocedure Evaluation (Signed)
Anesthesia Evaluation  Patient identified by MRN, date of birth, ID band Patient awake  General Assessment Comment:In wheelchair  Airway Mallampati: I  TM Distance: >3 FB Neck ROM: Full    Dental  (+) Poor Dentition, Teeth Intact   Pulmonary sleep apnea and Continuous Positive Airway Pressure Ventilation , pneumonia, former smoker,    Pulmonary exam normal        Cardiovascular Exercise Tolerance: Poor METS: < 3 Mets hypertension, + Past MI  Normal cardiovascular exam+ dysrhythmias  Rhythm:Regular Rate:Normal  - Left ventricle: distal anterrior wall septal apical and inferior   apical hypokinesis The cavity size was moderately dilated. Wall   thickness was normal. Systolic function was moderately reduced.   The estimated ejection fraction was in the range of 35% to 40%.   Left ventricular diastolic function parameters were normal.  (2017)  ECG:  NSR, old infarct, non-specific ST  (2018)   Neuro/Psych    GI/Hepatic GERD  Medicated and Controlled,  Endo/Other  diabetes, Poorly Controlled, Type 1  Renal/GU Renal diseaseResults for [name], [name] (MRN [id number]) as of 04/08/2017 06:40  04/02/2017 14:19 Sodium: 141 Potassium: 4.0 Chloride: 110 CO2: 22 Glucose: 112 (H) Mean Plasma Glucose: 188.64 BUN: 30 (H) Creatinine: 1.79 (H)      Musculoskeletal  (+) Arthritis ,   Abdominal (+) + obese,   Peds  Hematology  (+) anemia ,   Anesthesia Other Findings   Reproductive/Obstetrics                             Anesthesia Physical Anesthesia Plan  ASA: IV  Anesthesia Plan: MAC   Post-op Pain Management:    Induction:   PONV Risk Score and Plan: 1  Airway Management Planned: Nasal Cannula  Additional Equipment:   Intra-op Plan:   Post-operative Plan:   Informed Consent: I have reviewed the patients History and Physical, chart, labs and discussed the procedure including the  risks, benefits and alternatives for the proposed anesthesia with the patient or authorized representative who has indicated his/her understanding and acceptance.   Dental advisory given  Plan Discussed with: CRNA  Anesthesia Plan Comments:         Anesthesia Quick Evaluation

## 2017-04-08 NOTE — Op Note (Signed)
OPERATIVE NOTE  DATE OF PROCEDURE 04/08/2017  BRIEF OPERATIVE NOTE  DATE OF PROCEDURE 04/08/2017  SURGEON Marcheta Grammes, DPM  ASSISTANT SURGEON None.  OR STAFF Circulator: Towanda Malkin, RN Scrub Person: Karin Lieu, CST   PREOPERATIVE DIAGNOSIS 1.  Chronic ulceration of right great toe 2.  Diabetes mellitus with peripheral neuropathy  POSTOPERATIVE DIAGNOSIS Same  PROCEDURE 1.  Excision of ulceration of right great toe 2.  Ostectomy of right great toe  ANESTHESIA Monitor Anesthesia Care   HEMOSTASIS Pneumatic ankle tourniquet applied but never inflated.  ESTIMATED BLOOD LOSS <25 cc  MATERIALS USED None  INJECTABLES 0.5% Marcaine plain  PATHOLOGY Bone from distal phalanx  COMPLICATIONS None  INDICATIONS: Chronic ulceration of the right great toe that failed to heal with local wound care and offloading.  DESCRIPTION OF THE PROCEDURE:  The patient was brought to the operating room and placed on the operative table in the supine position.  A pneumatic ankle tourniquet was applied to the operative extremity.  Following sedation, the surgical site was anesthetized with 0.5% Marcaine plain.  The foot was then prepped, scrubbed, and draped in the usual sterile technique.  The tourniquet was never inflated during any portion of the procedure.  Attention was directed to the distal aspect of the right hallux where a full-thickness ulceration was encountered.  A #15 blade was used to create 2 converging semielliptical incisions encompassing the ulceration in its entirety.  The skin and underlying subcutaneous tissue was excised and passed from the operative field.  Dissection was continued deep down to the level of the distal phalanx.  The bone was firm and free of any erosive changes.  A power saw was used to resect the distal and distal plantar aspects of the distal phalanx.  All rough edges were smoothed with the power rasp.  A bone sample was sent to  pathology for evaluation.  The surgical wound was irrigated with copious amounts of sterile irrigant.  The subcutaneous structures were reapproximated using 4-0 Vicryl.  The skin was reapproximated using 4-0 nylon.  A sterile compressive dressing was applied to the right foot.  The patient tolerated the procedure well.  The patient was then transferred to PACU with vital signs stable and vascular status intact to all toes of the operative foot.

## 2017-04-08 NOTE — Transfer of Care (Signed)
Immediate Anesthesia Transfer of Care Note  Patient: EHAB HUMBER  Procedure(s) Performed: EXCISION ULCERATION RIGHT GREAT TOE (Right Toe) OSTECTOMY RIGHT GREAT TOE (Right Toe)  Patient Location: PACU  Anesthesia Type:MAC  Level of Consciousness: awake, alert , oriented and patient cooperative  Airway & Oxygen Therapy: Patient Spontanous Breathing and Patient connected to nasal cannula oxygen  Post-op Assessment: Report given to RN and Post -op Vital signs reviewed and stable  Post vital signs: Reviewed and stable  Last Vitals:  Vitals:   04/08/17 0715 04/08/17 0730  BP: (!) 96/58 101/66  Resp: 19 15  Temp:    SpO2: 94% 97%    Last Pain:  Vitals:   04/08/17 0646  TempSrc: Oral  PainSc: 6       Patients Stated Pain Goal: 8 (09/47/09 6283)  Complications: No apparent anesthesia complications

## 2017-04-08 NOTE — H&P (Signed)
HISTORY AND PHYSICAL INTERVAL NOTE:  04/08/2017  7:20 AM  [name]  has presented today for surgery, with the diagnosis of chronic ulceration of right great toe, diabetes mellitus with peripheral neuropathy.  The various methods of treatment have been discussed with the patient.  No guarantees were given.  After consideration of risks, benefits and other options for treatment, the patient has consented to surgery.  I have reviewed the patients' chart and labs.    Patient Vitals for the past 24 hrs:  BP Temp Temp src Resp SpO2  04/08/17 0646 115/69 98.4 F (36.9 C) Oral 19 97 %    A history and physical examination was performed in my office.  The patient was reexamined.  There have been no changes to this history and physical examination.  Marcheta Grammes, DPM

## 2017-04-09 ENCOUNTER — Encounter (HOSPITAL_COMMUNITY): Payer: Self-pay | Admitting: Podiatry

## 2017-04-13 ENCOUNTER — Encounter (HOSPITAL_COMMUNITY): Payer: Self-pay | Admitting: Cardiology

## 2017-04-13 ENCOUNTER — Emergency Department (HOSPITAL_COMMUNITY): Payer: BLUE CROSS/BLUE SHIELD

## 2017-04-13 ENCOUNTER — Inpatient Hospital Stay (HOSPITAL_COMMUNITY)
Admission: EM | Admit: 2017-04-13 | Discharge: 2017-04-22 | DRG: 854 | Disposition: A | Discharge status: Skilled Nursing Facility | Payer: BLUE CROSS/BLUE SHIELD | Attending: Internal Medicine | Admitting: Internal Medicine

## 2017-04-13 ENCOUNTER — Encounter (HOSPITAL_COMMUNITY): Payer: Self-pay | Admitting: *Deleted

## 2017-04-13 ENCOUNTER — Other Ambulatory Visit: Payer: Self-pay

## 2017-04-13 DIAGNOSIS — W1849XA Other slipping, tripping and stumbling without falling, initial encounter: Secondary | ICD-10-CM | POA: Diagnosis present

## 2017-04-13 DIAGNOSIS — N451 Epididymitis: Secondary | ICD-10-CM | POA: Diagnosis not present

## 2017-04-13 DIAGNOSIS — E1122 Type 2 diabetes mellitus with diabetic chronic kidney disease: Secondary | ICD-10-CM | POA: Diagnosis present

## 2017-04-13 DIAGNOSIS — E785 Hyperlipidemia, unspecified: Secondary | ICD-10-CM | POA: Diagnosis present

## 2017-04-13 DIAGNOSIS — Y92003 Bedroom of unspecified non-institutional (private) residence as the place of occurrence of the external cause: Secondary | ICD-10-CM

## 2017-04-13 DIAGNOSIS — B952 Enterococcus as the cause of diseases classified elsewhere: Secondary | ICD-10-CM | POA: Diagnosis present

## 2017-04-13 DIAGNOSIS — I480 Paroxysmal atrial fibrillation: Secondary | ICD-10-CM | POA: Diagnosis present

## 2017-04-13 DIAGNOSIS — R6520 Severe sepsis without septic shock: Secondary | ICD-10-CM | POA: Diagnosis present

## 2017-04-13 DIAGNOSIS — IMO0001 Reserved for inherently not codable concepts without codable children: Secondary | ICD-10-CM

## 2017-04-13 DIAGNOSIS — C9000 Multiple myeloma not having achieved remission: Secondary | ICD-10-CM | POA: Diagnosis present

## 2017-04-13 DIAGNOSIS — Z9989 Dependence on other enabling machines and devices: Secondary | ICD-10-CM

## 2017-04-13 DIAGNOSIS — I4892 Unspecified atrial flutter: Secondary | ICD-10-CM | POA: Diagnosis present

## 2017-04-13 DIAGNOSIS — D649 Anemia, unspecified: Secondary | ICD-10-CM | POA: Diagnosis present

## 2017-04-13 DIAGNOSIS — G4733 Obstructive sleep apnea (adult) (pediatric): Secondary | ICD-10-CM | POA: Diagnosis present

## 2017-04-13 DIAGNOSIS — N5089 Other specified disorders of the male genital organs: Secondary | ICD-10-CM | POA: Diagnosis not present

## 2017-04-13 DIAGNOSIS — I130 Hypertensive heart and chronic kidney disease with heart failure and stage 1 through stage 4 chronic kidney disease, or unspecified chronic kidney disease: Secondary | ICD-10-CM | POA: Diagnosis present

## 2017-04-13 DIAGNOSIS — Z955 Presence of coronary angioplasty implant and graft: Secondary | ICD-10-CM

## 2017-04-13 DIAGNOSIS — I10 Essential (primary) hypertension: Secondary | ICD-10-CM | POA: Diagnosis present

## 2017-04-13 DIAGNOSIS — A419 Sepsis, unspecified organism: Principal | ICD-10-CM | POA: Diagnosis present

## 2017-04-13 DIAGNOSIS — Z7901 Long term (current) use of anticoagulants: Secondary | ICD-10-CM

## 2017-04-13 DIAGNOSIS — S3131XA Laceration without foreign body of scrotum and testes, initial encounter: Secondary | ICD-10-CM | POA: Diagnosis present

## 2017-04-13 DIAGNOSIS — E1165 Type 2 diabetes mellitus with hyperglycemia: Secondary | ICD-10-CM | POA: Diagnosis present

## 2017-04-13 DIAGNOSIS — N493 Fournier gangrene: Secondary | ICD-10-CM | POA: Diagnosis present

## 2017-04-13 DIAGNOSIS — I872 Venous insufficiency (chronic) (peripheral): Secondary | ICD-10-CM | POA: Diagnosis present

## 2017-04-13 DIAGNOSIS — E119 Type 2 diabetes mellitus without complications: Secondary | ICD-10-CM

## 2017-04-13 DIAGNOSIS — N183 Chronic kidney disease, stage 3 (moderate): Secondary | ICD-10-CM | POA: Diagnosis present

## 2017-04-13 DIAGNOSIS — K219 Gastro-esophageal reflux disease without esophagitis: Secondary | ICD-10-CM | POA: Diagnosis present

## 2017-04-13 DIAGNOSIS — E11649 Type 2 diabetes mellitus with hypoglycemia without coma: Secondary | ICD-10-CM | POA: Diagnosis not present

## 2017-04-13 DIAGNOSIS — I2510 Atherosclerotic heart disease of native coronary artery without angina pectoris: Secondary | ICD-10-CM | POA: Diagnosis present

## 2017-04-13 DIAGNOSIS — I5022 Chronic systolic (congestive) heart failure: Secondary | ICD-10-CM | POA: Diagnosis present

## 2017-04-13 DIAGNOSIS — N492 Inflammatory disorders of scrotum: Secondary | ICD-10-CM | POA: Diagnosis present

## 2017-04-13 DIAGNOSIS — N5082 Scrotal pain: Secondary | ICD-10-CM

## 2017-04-13 DIAGNOSIS — N179 Acute kidney failure, unspecified: Secondary | ICD-10-CM | POA: Diagnosis present

## 2017-04-13 DIAGNOSIS — Z794 Long term (current) use of insulin: Secondary | ICD-10-CM

## 2017-04-13 DIAGNOSIS — Z87891 Personal history of nicotine dependence: Secondary | ICD-10-CM

## 2017-04-13 DIAGNOSIS — Z6841 Body Mass Index (BMI) 40.0 and over, adult: Secondary | ICD-10-CM

## 2017-04-13 DIAGNOSIS — Z79899 Other long term (current) drug therapy: Secondary | ICD-10-CM

## 2017-04-13 DIAGNOSIS — I252 Old myocardial infarction: Secondary | ICD-10-CM

## 2017-04-13 DIAGNOSIS — E669 Obesity, unspecified: Secondary | ICD-10-CM | POA: Diagnosis present

## 2017-04-13 LAB — BASIC METABOLIC PANEL
Anion gap: 9 (ref 5–15)
BUN: 41 mg/dL — ABNORMAL HIGH (ref 6–20)
CO2: 23 mmol/L (ref 22–32)
Calcium: 7.8 mg/dL — ABNORMAL LOW (ref 8.9–10.3)
Chloride: 103 mmol/L (ref 101–111)
Creatinine, Ser: 1.91 mg/dL — ABNORMAL HIGH (ref 0.61–1.24)
GFR calc Af Amer: 43 mL/min — ABNORMAL LOW (ref >60)
GFR calc non Af Amer: 37 mL/min — ABNORMAL LOW (ref >60)
Glucose, Bld: 229 mg/dL — ABNORMAL HIGH (ref 65–99)
Potassium: 4 mmol/L (ref 3.5–5.1)
Sodium: 135 mmol/L (ref 135–145)

## 2017-04-13 LAB — CBC WITH DIFFERENTIAL/PLATELET
Basophils Absolute: 0 10*3/uL (ref 0.0–0.1)
Basophils Relative: 0 %
Eosinophils Absolute: 0.1 10*3/uL (ref 0.0–0.7)
Eosinophils Relative: 1 %
HCT: 28.9 % — ABNORMAL LOW (ref 39.0–52.0)
Hemoglobin: 9 g/dL — ABNORMAL LOW (ref 13.0–17.0)
Lymphocytes Relative: 6 %
Lymphs Abs: 1 10*3/uL (ref 0.7–4.0)
MCH: 29.6 pg (ref 26.0–34.0)
MCHC: 31.1 g/dL (ref 30.0–36.0)
MCV: 95.1 fL (ref 78.0–100.0)
Monocytes Absolute: 0.8 10*3/uL (ref 0.1–1.0)
Monocytes Relative: 5 %
Neutro Abs: 14.7 10*3/uL — ABNORMAL HIGH (ref 1.7–7.7)
Neutrophils Relative %: 88 %
Platelets: 157 10*3/uL (ref 150–400)
RBC: 3.04 MIL/uL — ABNORMAL LOW (ref 4.22–5.81)
RDW: 16.9 % — ABNORMAL HIGH (ref 11.5–15.5)
WBC: 16.6 10*3/uL — ABNORMAL HIGH (ref 4.0–10.5)

## 2017-04-13 LAB — URINALYSIS, ROUTINE W REFLEX MICROSCOPIC
Bilirubin Urine: NEGATIVE
Glucose, UA: NEGATIVE mg/dL
Ketones, ur: NEGATIVE mg/dL
Nitrite: NEGATIVE
Protein, ur: 100 mg/dL — AB
RBC / HPF: NONE SEEN RBC/hpf (ref 0–5)
Specific Gravity, Urine: 1.009 (ref 1.005–1.030)
pH: 5 (ref 5.0–8.0)

## 2017-04-13 LAB — GLUCOSE, CAPILLARY
Glucose-Capillary: 216 mg/dL — ABNORMAL HIGH (ref 65–99)
Glucose-Capillary: 232 mg/dL — ABNORMAL HIGH (ref 65–99)

## 2017-04-13 MED ORDER — METOPROLOL SUCCINATE ER 25 MG PO TB24
25.0000 mg | ORAL_TABLET | Freq: Every day | ORAL | Status: DC
  Administered 2017-04-14 – 2017-04-22 (×8): 25 mg via ORAL
  Filled 2017-04-13 (×8): qty 1

## 2017-04-13 MED ORDER — OXYCODONE-ACETAMINOPHEN 5-325 MG PO TABS
1.0000 | ORAL_TABLET | Freq: Once | ORAL | Status: AC
  Administered 2017-04-13: 1 via ORAL
  Filled 2017-04-13: qty 1

## 2017-04-13 MED ORDER — DEXAMETHASONE 4 MG PO TABS: 10.0000 mg | ORAL_TABLET | ORAL | Status: DC

## 2017-04-13 MED ORDER — VITAMIN D (ERGOCALCIFEROL) 1.25 MG (50000 UNIT) PO CAPS
50000.0000 [IU] | ORAL_CAPSULE | ORAL | Status: DC
  Administered 2017-04-20: 50000 [IU] via ORAL
  Filled 2017-04-13: qty 1

## 2017-04-13 MED ORDER — SULFAMETHOXAZOLE-TRIMETHOPRIM 800-160 MG PO TABS
1.0000 | ORAL_TABLET | ORAL | Status: DC
  Administered 2017-04-17 – 2017-04-22 (×3): 1 via ORAL
  Filled 2017-04-13 (×3): qty 1

## 2017-04-13 MED ORDER — ACETAMINOPHEN 325 MG PO TABS
650.0000 mg | ORAL_TABLET | Freq: Four times a day (QID) | ORAL | Status: DC | PRN
  Administered 2017-04-13: 650 mg via ORAL
  Filled 2017-04-13: qty 2

## 2017-04-13 MED ORDER — CALCITRIOL 0.25 MCG PO CAPS
0.2500 ug | ORAL_CAPSULE | ORAL | Status: DC
  Administered 2017-04-17 – 2017-04-22 (×3): 0.25 ug via ORAL
  Filled 2017-04-13 (×4): qty 1

## 2017-04-13 MED ORDER — GABAPENTIN 300 MG PO CAPS
300.0000 mg | ORAL_CAPSULE | Freq: Every day | ORAL | Status: DC
  Administered 2017-04-13 – 2017-04-18 (×5): 300 mg via ORAL
  Filled 2017-04-13 (×5): qty 1

## 2017-04-13 MED ORDER — PANTOPRAZOLE SODIUM 40 MG PO TBEC
40.0000 mg | DELAYED_RELEASE_TABLET | Freq: Every day | ORAL | Status: DC
  Administered 2017-04-14 – 2017-04-22 (×8): 40 mg via ORAL
  Filled 2017-04-13 (×8): qty 1

## 2017-04-13 MED ORDER — OXYCODONE-ACETAMINOPHEN 5-325 MG PO TABS
1.0000 | ORAL_TABLET | Freq: Four times a day (QID) | ORAL | Status: DC | PRN
  Administered 2017-04-13: 2 via ORAL
  Filled 2017-04-13: qty 2

## 2017-04-13 MED ORDER — COLCHICINE 0.6 MG PO TABS
0.6000 mg | ORAL_TABLET | Freq: Every day | ORAL | Status: DC
  Administered 2017-04-14 – 2017-04-22 (×6): 0.6 mg via ORAL
  Filled 2017-04-13 (×9): qty 1

## 2017-04-13 MED ORDER — ASPIRIN EC 81 MG PO TBEC
81.0000 mg | DELAYED_RELEASE_TABLET | Freq: Every day | ORAL | Status: DC
  Administered 2017-04-14: 81 mg via ORAL
  Filled 2017-04-13: qty 1

## 2017-04-13 MED ORDER — LENALIDOMIDE 5 MG PO CAPS: 5.0000 mg | ORAL_CAPSULE | Freq: Every day | ORAL | Status: DC

## 2017-04-13 MED ORDER — POTASSIUM CHLORIDE CRYS ER 20 MEQ PO TBCR
40.0000 meq | EXTENDED_RELEASE_TABLET | Freq: Two times a day (BID) | ORAL | Status: DC
  Administered 2017-04-13 – 2017-04-14 (×2): 40 meq via ORAL
  Filled 2017-04-13 (×2): qty 2

## 2017-04-13 MED ORDER — CALCIUM CARBONATE 600 MG PO TABS: 1800.0000 mg | ORAL_TABLET | Freq: Two times a day (BID) | ORAL | Status: DC

## 2017-04-13 MED ORDER — ALLOPURINOL 300 MG PO TABS
300.0000 mg | ORAL_TABLET | Freq: Every day | ORAL | Status: DC
  Administered 2017-04-14 – 2017-04-22 (×7): 300 mg via ORAL
  Filled 2017-04-13 (×2): qty 1
  Filled 2017-04-13: qty 3
  Filled 2017-04-13: qty 1
  Filled 2017-04-13 (×2): qty 3
  Filled 2017-04-13 (×2): qty 1

## 2017-04-13 MED ORDER — OXYCODONE-ACETAMINOPHEN 5-325 MG PO TABS
1.0000 | ORAL_TABLET | ORAL | Status: DC | PRN
  Administered 2017-04-14 – 2017-04-16 (×7): 2 via ORAL
  Administered 2017-04-17: 1 via ORAL
  Administered 2017-04-18 (×2): 2 via ORAL
  Filled 2017-04-13: qty 1
  Filled 2017-04-13 (×4): qty 2
  Filled 2017-04-13: qty 1
  Filled 2017-04-13 (×5): qty 2

## 2017-04-13 MED ORDER — LEVOFLOXACIN IN D5W 500 MG/100ML IV SOLN
500.0000 mg | Freq: Once | INTRAVENOUS | Status: AC
  Administered 2017-04-13: 500 mg via INTRAVENOUS
  Filled 2017-04-13: qty 100

## 2017-04-13 MED ORDER — ACYCLOVIR 400 MG PO TABS
400.0000 mg | ORAL_TABLET | Freq: Every morning | ORAL | Status: DC
  Administered 2017-04-14 – 2017-04-22 (×8): 400 mg via ORAL
  Filled 2017-04-13 (×9): qty 1

## 2017-04-13 MED ORDER — ADULT MULTIVITAMIN W/MINERALS CH
1.0000 | ORAL_TABLET | Freq: Every day | ORAL | Status: DC
  Administered 2017-04-14 – 2017-04-22 (×8): 1 via ORAL
  Filled 2017-04-13 (×10): qty 1

## 2017-04-13 MED ORDER — INSULIN ASPART 100 UNIT/ML ~~LOC~~ SOLN
0.0000 [IU] | Freq: Three times a day (TID) | SUBCUTANEOUS | Status: DC
  Administered 2017-04-13 – 2017-04-14 (×2): 3 [IU] via SUBCUTANEOUS
  Administered 2017-04-14: 9 [IU] via SUBCUTANEOUS
  Administered 2017-04-14: 3 [IU] via SUBCUTANEOUS
  Administered 2017-04-16: 1 [IU] via SUBCUTANEOUS
  Administered 2017-04-18: 2 [IU] via SUBCUTANEOUS
  Administered 2017-04-18: 3 [IU] via SUBCUTANEOUS
  Administered 2017-04-18 – 2017-04-19 (×3): 1 [IU] via SUBCUTANEOUS
  Administered 2017-04-19: 2 [IU] via SUBCUTANEOUS
  Administered 2017-04-21: 1 [IU] via SUBCUTANEOUS

## 2017-04-13 MED ORDER — FENTANYL CITRATE (PF) 100 MCG/2ML IJ SOLN
100.0000 ug | Freq: Once | INTRAMUSCULAR | Status: AC
  Administered 2017-04-13: 100 ug via INTRAVENOUS
  Filled 2017-04-13: qty 2

## 2017-04-13 MED ORDER — TORSEMIDE 20 MG PO TABS
40.0000 mg | ORAL_TABLET | Freq: Every day | ORAL | Status: DC
  Administered 2017-04-14 – 2017-04-22 (×8): 40 mg via ORAL
  Filled 2017-04-13 (×9): qty 2

## 2017-04-13 MED ORDER — ATORVASTATIN CALCIUM 40 MG PO TABS
80.0000 mg | ORAL_TABLET | Freq: Every day | ORAL | Status: DC
  Administered 2017-04-13 – 2017-04-21 (×8): 80 mg via ORAL
  Filled 2017-04-13 (×8): qty 2

## 2017-04-13 MED ORDER — DOXYCYCLINE HYCLATE 100 MG PO TABS
100.0000 mg | ORAL_TABLET | Freq: Two times a day (BID) | ORAL | Status: DC
  Administered 2017-04-13 – 2017-04-14 (×2): 100 mg via ORAL
  Filled 2017-04-13 (×2): qty 1

## 2017-04-13 MED ORDER — ACETAMINOPHEN 325 MG PO TABS
325.0000 mg | ORAL_TABLET | Freq: Once | ORAL | Status: AC
  Administered 2017-04-13: 325 mg via ORAL
  Filled 2017-04-13: qty 1

## 2017-04-13 MED ORDER — ELUXADOLINE 100 MG PO TABS
100.0000 mg | ORAL_TABLET | Freq: Every day | ORAL | Status: DC
  Administered 2017-04-14 – 2017-04-18 (×3): 100 mg via ORAL

## 2017-04-13 MED ORDER — INSULIN DETEMIR 100 UNIT/ML ~~LOC~~ SOLN
40.0000 [IU] | Freq: Every morning | SUBCUTANEOUS | Status: DC
  Administered 2017-04-14 – 2017-04-17 (×4): 40 [IU] via SUBCUTANEOUS
  Filled 2017-04-13 (×5): qty 0.4

## 2017-04-13 MED ORDER — RIVAROXABAN 20 MG PO TABS
20.0000 mg | ORAL_TABLET | Freq: Every day | ORAL | Status: DC
  Administered 2017-04-13: 20 mg via ORAL
  Filled 2017-04-13: qty 1

## 2017-04-13 MED ORDER — DEXTROSE 5 % IV SOLN
2.0000 g | INTRAVENOUS | Status: DC
  Administered 2017-04-13: 2 g via INTRAVENOUS
  Filled 2017-04-13 (×2): qty 2

## 2017-04-13 NOTE — ED Triage Notes (Signed)
Bilateral testicle pain and swelling since Friday.

## 2017-04-13 NOTE — Progress Notes (Signed)
Tylenol 650 mg tablets were administered to patient for temperature of 102.7. Will continue to monitor patient.

## 2017-04-13 NOTE — H&P (Signed)
[facility] History and Physical  [name] XKP:[id number] DOB: 1960/06/26 DOA: 04/13/2017  Referring physician:  PCP: [name], MD  Specialists:   Chief Complaint: scrotal pain   HPI: [name] is a 56 y.o. male with PMH of PAF on (xarelto), HTN, DM, CKD, Multiple myeloma, previous h/o epidermitis presented with scrotal pain. Patient states that he was not able to ambulate well due to his recent toe surgery. He was sliding down from his bed and thinks he had some laceration in the posterior wall of his scrotum. He developed swelling and and pain at the scrotum for 3 days. Pain is worse with putting some weight or moving across the bed. He reports previous similar symptoms and diagnosed with epididymitis, treated with antibiotics last year. He denies fever, no chills, no acute chest pains, no shortness of breath. He reports chronic intermittent diarrhea. No vomiting, no abdominal pains. He has chronic leg edema and takes demedex.  -ED: scrotal ultrasound showed prominent epididymis on the left with slight increased vascularity. This may represent a component of epididymitis. ED d/w urology Dr. Pilar Jarvis who agreed for iv antibiotic treatment. hospitalist is called for admission     Review of Systems: The patient denies anorexia, fever, weight loss,, vision loss, decreased hearing, hoarseness, chest pain, syncope, dyspnea on exertion, peripheral edema, balance deficits, hemoptysis, abdominal pain, melena, hematochezia, severe indigestion/heartburn, hematuria, incontinence, genital sores, muscle weakness, suspicious skin lesions, transient blindness, difficulty walking, depression, unusual weight change, abnormal bleeding, enlarged lymph nodes, angioedema, and breast masses.    Past Medical History:  Diagnosis Date  . Anemia   . Arteriosclerotic cardiovascular disease (ASCVD)    MI-2000s; stent to the proximal LAD and diagonal in 2001; stress nuclear in 2008-impaired exercise  capacity, left ventricular dilatation, moderately to severely depressed EF, apical, inferior and anteroseptal scar  . Arthritis   . Atrial flutter ([place])   . 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 Cellulitis of leg    both legs  . Chronic diarrhea   . Chronic kidney disease, stage 3, mod decreased GFR (HCC)    Creatinine of 1.84 in 06/2011 and 1.5 in 07/2011  . Diabetes mellitus    Insulin  . Dysrhythmia    AFlutter  . GERD (gastroesophageal reflux disease)   . Gout   . Hyperlipidemia   . Hypertension   . Injection site reaction   . Multiple myeloma 07/01/2011  . Myocardial infarction ([place]) 2000  . Obesity   . Pedal edema    Venous insufficiency  . Sleep apnea    uses cpap  . Ulcer    Past Surgical History:  Procedure Laterality Date  . ABSCESS DRAINAGE     Scrotal  . BONE MARROW BIOPSY  05/13/11  . CARDIAC CATHETERIZATION     cardiac stent  . INCISION AND DRAINAGE ABSCESS ANAL    . LAPAROSCOPIC GASTRIC BANDING  2006   has been removed  . WRIST SURGERY     Left; removal of bone fragment   Social History:  reports that he quit smoking about 15 years ago. His smoking use included cigarettes and cigars. He has a 0.25 pack-year smoking history. he has never used smokeless tobacco. He reports that he does not drink alcohol or use drugs. Home;  where does patient live--home, ALF, SNF? and with whom if at home? No;  Can patient participate in ADLs?  No Known Allergies  Family History  Problem Relation Age of Onset  . Heart disease Mother   .  Cancer Mother   . Diabetes Father   . Arthritis Unknown   . Anesthesia problems Neg Hx   . Hypotension Neg Hx   . Malignant hyperthermia Neg Hx   . Pseudochol deficiency Neg Hx     (be sure to complete)  Prior to Admission medications   Medication Sig Start Date End Date Taking? Authorizing Provider  acyclovir (ZOVIRAX) 400 MG tablet Take 1 tablet (400 mg total) by mouth every morning. 02/18/17   Twana First, MD  allopurinol  (ZYLOPRIM) 300 MG tablet TAKE ONE TABLET BY MOUTH ONCE DAILY. 10/29/16   Baird Cancer, PA-C  allopurinol (ZYLOPRIM) 300 MG tablet TAKE ONE TABLET BY MOUTH ONCE DAILY. 04/07/17   Holley Bouche, NP  Artificial Tear Ointment (DRY EYES OP) Place 1 drop into both eyes as needed (dry eyes).    [provider]  aspirin EC 81 MG tablet Take 81 mg by mouth daily.    [provider]  atorvastatin (LIPITOR) 80 MG tablet TAKE ONE TABLET BY MOUTH AT BEDTIME. 12/18/16   Herminio Commons, MD  calcitRIOL (ROCALTROL) 0.25 MCG capsule Take 0.25 mcg by mouth 3 (three) times a week. Monday, Wednesday, Friday. 12/14/13   [provider]  calcium carbonate (OS-CAL) 600 MG TABS tablet Take 1,800 mg by mouth 2 (two) times daily.     [provider]  colchicine 0.6 MG tablet Take 1 tablet (0.6 mg total) by mouth 2 (two) times daily. 12/05/16   Tanna Furry, MD  dexamethasone (DECADRON) 4 MG tablet TAKE 5 TABLETS TWICE A DAY EVERY FRIDAY. 03/24/17   Holley Bouche, NP  Eluxadoline (VIBERZI) 100 MG TABS Take 100 mg by mouth daily.    [provider]  gabapentin (NEURONTIN) 300 MG capsule Take 1 capsule by mouth qHS the first week, if not too sleepy can increase to 1 capsule twice a day then second week. 02/17/17   Twana First, MD  insulin detemir (LEVEMIR) 100 UNIT/ML injection Inject 40 Units into the skin every morning.  09/08/12   Kathie Dike, MD  insulin lispro (HUMALOG) 100 UNIT/ML injection Inject 5-30 Units into the skin 2 (two) times daily with a meal. Sliding Scale per patient  CBG 150-200: 5 units CBG 201-275: 10 units CBG 275-350: 15 units 351-425: 20 units 425-500: 25 units 500+: 30 units    [provider]  lenalidomide ([name]) 5 MG capsule TAKE 1 CAPSULE BY MOUTH DAILY FOR 7 DAYS ON FOLLOWED BY 7 DAYS OFF. 03/23/17   Twana First, MD  lisinopril (PRINIVIL,ZESTRIL) 5 MG tablet Take 5 mg by mouth every other day. 12/14/13   [provider]  loratadine (CLARITIN) 10 MG tablet Take 10 mg by mouth every morning.     [provider]  Magnesium Cl-Calcium Carbonate (SLOW-MAG PO) Take 1 tablet by mouth every morning.    [provider]  metoprolol succinate (TOPROL-XL) 25 MG 24 hr tablet TAKE (1) TABLET BY MOUTH ONCE DAILY. 03/30/17   Herminio Commons, MD  Multiple Vitamins-Minerals (MULTIVITAMINS THER. W/MINERALS) TABS Take 1 tablet by mouth daily.     [provider]  nitroGLYCERIN (NITROSTAT) 0.4 MG SL tablet Place 1 tablet (0.4 mg total) under the tongue every 5 (five) minutes as needed for chest pain. 03/16/15   Penland, Kelby Fam, MD  omeprazole (PRILOSEC) 40 MG capsule Take 1 capsule (40 mg total) by mouth daily. 02/17/17   Twana First, MD  oxyCODONE-acetaminophen (PERCOCET/ROXICET) 5-325 MG tablet 1 or 2  tabs PO q6h prn pain Patient taking differently: Take 1-2 tablets by mouth every 6 (six) hours as needed for moderate pain.  01/29/17   Francine Graven, DO  potassium chloride SA (K-DUR,KLOR-CON) 20 MEQ tablet Take 2 tablets (40 mEq total) by mouth 2 (two) times daily. 02/27/17   Twana First, MD  sodium bicarbonate 650 MG tablet Take 1,300 mg by mouth 3 (three) times daily.    [provider]  sulfamethoxazole-trimethoprim (BACTRIM DS,SEPTRA DS) 800-160 MG tablet TAKE ONE TABLET BY MOUTH EVERY MONDAY, WEDNESDAY, AND FRIDAY. 03/17/17   Holley Bouche, NP  torsemide (DEMADEX) 20 MG tablet TAKE 2 TABLETS BY MOUTH EACH MORNING AND 1 TABLET EACH EVENING. 04/22/16   Setzer, Rona Ravens, NP  Vitamin D, Ergocalciferol, (DRISDOL) 50000 units CAPS capsule TAKE 1 CAPSULE BY MOUTH EVERY THIRTY DAYS. 03/31/16   Baird Cancer, PA-C  XARELTO 20 MG TABS tablet TAKE 1 TABLET BY MOUTH DAILY. 09/02/16   Herminio Commons, MD   Physical Exam: Vitals:   04/13/17 1100 04/13/17 1309  BP: 122/64 120/60  Pulse: 84 96  Resp: 20 20  SpO2: 98% 97%     General:  Alert. No distress   Eyes:  eom-I, perrla   ENT: no oral ulcers   Neck: supple  Cardiovascular: s1,s2 rrr  Respiratory: cta bl  Abdomen: soft, obese. nt  Skin: no rash   Musculoskeletal: chronic leg edema   Scrotum: edema, tender  Psychiatric: no hallucinations.   Neurologic: cn 2-12 intact. Motor 5/5 BL  Labs on Admission:  Basic Metabolic Panel: Recent Labs  Lab 04/13/17 1143  NA 135  K 4.0  CL 103  CO2 23  GLUCOSE 229*  BUN 41*  CREATININE 1.91*  CALCIUM 7.8*   Liver Function Tests: No results for input(s): AST, ALT, ALKPHOS, BILITOT, PROT, ALBUMIN in the last 168 hours. No results for input(s): LIPASE, AMYLASE in the last 168 hours. No results for input(s): AMMONIA in the last 168 hours. CBC: Recent Labs  Lab 04/13/17 1143  WBC 16.6*  NEUTROABS 14.7*  HGB 9.0*  HCT 28.9*  MCV 95.1  PLT 157   Cardiac Enzymes: No results for input(s): CKTOTAL, CKMB, CKMBINDEX, TROPONINI in the last 168 hours.  BNP (last 3 results) No results for input(s): BNP in the last 8760 hours.  ProBNP (last 3 results) No results for input(s): PROBNP in the last 8760 hours.  CBG: Recent Labs  Lab 04/08/17 0655 04/08/17 0838  GLUCAP 167* 167*    Radiological Exams on Admission: US Scrotum  Result Date: 04/13/2017 CLINICAL DATA:  Pain and swelling for several days left greater than right EXAM: SCROTAL ULTRASOUND DOPPLER ULTRASOUND OF THE TESTICLES TECHNIQUE: Complete ultrasound examination of the testicles, epididymis, and other scrotal structures was performed. Color and spectral Doppler ultrasound were also utilized to evaluate blood flow to the testicles. COMPARISON:  None. FINDINGS: Right testicle Measurements: 3.5 x 2.3 x 2.5 cm. No mass or microlithiasis visualized. Left testicle Measurements: 3.5 x 2.4 x 2.4 cm. No mass or microlithiasis visualized. Right epididymis:  Normal in size and appearance. Left epididymis:  Prominent with slight increased vascularity. Hydrocele:  Small hydroceles noted  bilaterally. Varicocele:  None visualized. Pulsed Doppler interrogation of both testes demonstrates normal low resistance arterial and venous waveforms bilaterally. Note is made of scrotal thickening bilaterally. IMPRESSION: Prominent epididymis on the left with slight increased vascularity. This may represent a component of epididymitis. Normal-appearing testicles bilaterally. Small hydroceles bilaterally with scrotal skin thickening. Electronically Signed  By: Inez Catalina M.D.   On: 04/13/2017 12:37   US Pelvic Doppler (torsion R/o Or Mass Arterial Flow)  Result Date: 04/13/2017 CLINICAL DATA:  Pain and swelling for several days left greater than right EXAM: SCROTAL ULTRASOUND DOPPLER ULTRASOUND OF THE TESTICLES TECHNIQUE: Complete ultrasound examination of the testicles, epididymis, and other scrotal structures was performed. Color and spectral Doppler ultrasound were also utilized to evaluate blood flow to the testicles. COMPARISON:  None. FINDINGS: Right testicle Measurements: 3.5 x 2.3 x 2.5 cm. No mass or microlithiasis visualized. Left testicle Measurements: 3.5 x 2.4 x 2.4 cm. No mass or microlithiasis visualized. Right epididymis:  Normal in size and appearance. Left epididymis:  Prominent with slight increased vascularity. Hydrocele:  Small hydroceles noted bilaterally. Varicocele:  None visualized. Pulsed Doppler interrogation of both testes demonstrates normal low resistance arterial and venous waveforms bilaterally. Note is made of scrotal thickening bilaterally. IMPRESSION: Prominent epididymis on the left with slight increased vascularity. This may represent a component of epididymitis. Normal-appearing testicles bilaterally. Small hydroceles bilaterally with scrotal skin thickening. Electronically Signed   By: Inez Catalina M.D.   On: 04/13/2017 12:37    EKG: Independently reviewed.   Assessment/Plan Active Problems:   Hypertension   Atrial flutter (HCC)   Anemia, normocytic  normochromic   Epididymitis   56 y.o. male with PMH of PAF on (xarelto), HTN, DM, CKD, Multiple myeloma, previous h/o epidermitis presented with scrotal pain and admitted with epididymitis, scrotal cellulitis  -ED: scrotal ultrasound showed prominent epididymis on the left with slight increased vascularity. This may represent a component of epididymitis. ED d/w urology Dr. Pilar Jarvis who agreed for iv antibiotic treatment. hospitalist is called for admission      Scrotal cellulitis. Suspected epididymitis. Previous similar episodes. Will cont iv antibiotics, check for STDs. Possible transition to oral regimen in 24-48 hrs. Cont pain control with percocet   DM. Home regimen levemir 40U+ISS. Will resume and monitor   HTN. Stable. Hold lisinopril today due to mild worsening creatinine. Recheck labs AM  Multiple myeloma complicated with anemia, ckd. On chronic treatment-dexa, revlimid, bactrim, acyclovir . Resume home regimen  PAF on (xarelto). HR is stable on BB. Monitor    Urology over the phone.  if consultant consulted, please document name and whether formally or informally consulted  Code Status: full (must indicate code status--if unknown or must be presumed, indicate so) Family Communication: d/w patient, his wife. ED/MD (indicate person spoken with, if applicable, with phone number if by telephone) Disposition Plan: home in 1-2 days  (indicate anticipated LOS)  Time spent: >35 minutes   Kinnie Feil [facility] Pager [phone]  If 7PM-7AM, please contact night-coverage [email or web address] Password [email or web address] 04/13/2017, 1:51 PM

## 2017-04-13 NOTE — Progress Notes (Signed)
Pt has oral  temperature of 102.7. MD notified and received order to administer Tylenol 650 mg oral and Blood cultures X2.

## 2017-04-13 NOTE — Progress Notes (Signed)
Patient's temperature reassessed and is now 102.8. MD notified and received verbal order to administer a one time dose of Tylenol 325 mg tablet.

## 2017-04-13 NOTE — Progress Notes (Addendum)
Pharmacy Antibiotic Note  [name] is a 56 y.o. male admitted on 04/13/2017 with UTI.  Pharmacy has been consulted for Cefepime dosing.  Plan: Cefepime 2gm IV q24h F/U cxs and clinical progress Monitor V/S, labs  Height: 6' 2.5" (189.2 cm) Weight: (!) 382 lb 3.2 oz (173.4 kg) IBW/kg (Calculated) : 83.35  Temp (24hrs), Avg:102.7 F (39.3 C), Min:102.7 F (39.3 C), Max:102.7 F (39.3 C)  Recent Labs  Lab 04/13/17 1143  WBC 16.6*  CREATININE 1.91*    Normalized CrCl = 17mls/min Estimated Creatinine Clearance: 71.2 mL/min (A) (by C-G formula based on SCr of 1.91 mg/dL (H)).    No Known Allergies  Antimicrobials this admission: Cefepime 11/12>>  Levaquin 11/12 >> 11/12  Dose adjustments this admission: N/A  Microbiology results: 11/12 BCx: pending 11/12 UCx: pending   Thank you for allowing pharmacy to be a part of this patient's care.  Isac Sarna, BS Pharm D, [place] Clinical Pharmacist Pager [phone] 04/13/2017 5:10 PM

## 2017-04-13 NOTE — Progress Notes (Signed)
[facility] CPAP placed for pt for sleep. CPAP plugged into red outlet.

## 2017-04-13 NOTE — ED Notes (Signed)
Pt returned from US

## 2017-04-13 NOTE — ED Provider Notes (Signed)
[facility] EMERGENCY DEPARTMENT Provider Note   CSN: [id number] Arrival date & time: 04/13/17  1012     History   Chief Complaint Chief Complaint  Patient presents with  . Testicle Pain    HPI [name] is a 56 y.o. male.  HPI Complains of swelling and pain at scrotum onset 3 days ago.  He is also noticed an open wound to the posterior aspect of the scrotum which is painful.  Pain is worse with putting weight on the area or moving across the bed.  Improved with remaining still.  No nausea no vomiting no fever.  Denies dysuria.  No other associated symptoms Past Medical History:  Diagnosis Date  . Anemia   . Arteriosclerotic cardiovascular disease (ASCVD)    MI-2000s; stent to the proximal LAD and diagonal in 2001; stress nuclear in 2008-impaired exercise capacity, left ventricular dilatation, moderately to severely depressed EF, apical, inferior and anteroseptal scar  . Arthritis   . Atrial flutter ([place])   . 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 Cellulitis of leg    both legs  . Chronic diarrhea   . Chronic kidney disease, stage 3, mod decreased GFR (HCC)    Creatinine of 1.84 in 06/2011 and 1.5 in 07/2011  . Diabetes mellitus    Insulin  . Dysrhythmia    AFlutter  . GERD (gastroesophageal reflux disease)   . Gout   . Hyperlipidemia   . Hypertension   . Injection site reaction   . Multiple myeloma 07/01/2011  . Myocardial infarction ([place]) 2000  . Obesity   . Pedal edema    Venous insufficiency  . Sleep apnea    uses cpap  . Ulcer     Patient Active Problem List   Diagnosis Date Noted  . Pneumonia 06/10/2016  . CAP (community acquired pneumonia) 06/10/2016  . Cellulitis of left lower extremity   . Hypogammaglobulinemia ([place]) 03/09/2016  . Hypokalemia 03/09/2016  . Diaphragmatic hernia without obstruction 02/26/2016  . Chronic systolic heart failure ([place]) 12/19/2013  . Anemia, normocytic normochromic 10/09/2012  . Chronic pancreatitis ([place])  10/07/2012  . Atrial flutter ([place]) 09/17/2012  . DDD (degenerative disc disease), cervical 03/18/2012  . Arteriosclerotic cardiovascular disease (ASCVD)   . Hypertension   . Hyperlipidemia   . Multiple myeloma ([place]) 07/01/2011  . Morbid obesity ([place]) 04/29/2010  . Insulin dependent diabetes mellitus ([place]) 11/15/2008  . Sleep apnea 11/15/2008    Past Surgical History:  Procedure Laterality Date  . ABSCESS DRAINAGE     Scrotal  . BONE MARROW BIOPSY  05/13/11  . CARDIAC CATHETERIZATION     cardiac stent  . INCISION AND DRAINAGE ABSCESS ANAL    . LAPAROSCOPIC GASTRIC BANDING  2006   has been removed  . WRIST SURGERY     Left; removal of bone fragment       Home Medications    Prior to Admission medications   Medication Sig Start Date End Date Taking? Authorizing Provider  acyclovir (ZOVIRAX) 400 MG tablet Take 1 tablet (400 mg total) by mouth every morning. 02/18/17   Twana First, MD  allopurinol (ZYLOPRIM) 300 MG tablet TAKE ONE TABLET BY MOUTH ONCE DAILY. 10/29/16   Baird Cancer, PA-C  allopurinol (ZYLOPRIM) 300 MG tablet TAKE ONE TABLET BY MOUTH ONCE DAILY. 04/07/17   Holley Bouche, NP  Artificial Tear Ointment (DRY EYES OP) Place 1 drop into both eyes as needed (dry eyes).    [provider]  aspirin EC 81 MG  tablet Take 81 mg by mouth daily.    [provider]  atorvastatin (LIPITOR) 80 MG tablet TAKE ONE TABLET BY MOUTH AT BEDTIME. 12/18/16   Herminio Commons, MD  calcitRIOL (ROCALTROL) 0.25 MCG capsule Take 0.25 mcg by mouth 3 (three) times a week. Monday, Wednesday, Friday. 12/14/13   [provider]  calcium carbonate (OS-CAL) 600 MG TABS tablet Take 1,800 mg by mouth 2 (two) times daily.     [provider]  colchicine 0.6 MG tablet Take 1 tablet (0.6 mg total) by mouth 2 (two) times daily. 12/05/16   Tanna Furry, MD  dexamethasone (DECADRON) 4 MG tablet TAKE 5 TABLETS TWICE A DAY EVERY FRIDAY. 03/24/17   Holley Bouche,  NP  Eluxadoline (VIBERZI) 100 MG TABS Take 100 mg by mouth daily.    [provider]  gabapentin (NEURONTIN) 300 MG capsule Take 1 capsule by mouth qHS the first week, if not too sleepy can increase to 1 capsule twice a day then second week. 02/17/17   Twana First, MD  insulin detemir (LEVEMIR) 100 UNIT/ML injection Inject 40 Units into the skin every morning.  09/08/12   Kathie Dike, MD  insulin lispro (HUMALOG) 100 UNIT/ML injection Inject 5-30 Units into the skin 2 (two) times daily with a meal. Sliding Scale per patient  CBG 150-200: 5 units CBG 201-275: 10 units CBG 275-350: 15 units 351-425: 20 units 425-500: 25 units 500+: 30 units    [provider]  lenalidomide ([name]) 5 MG capsule TAKE 1 CAPSULE BY MOUTH DAILY FOR 7 DAYS ON FOLLOWED BY 7 DAYS OFF. 03/23/17   Twana First, MD  lisinopril (PRINIVIL,ZESTRIL) 5 MG tablet Take 5 mg by mouth every other day. 12/14/13   [provider]  loratadine (CLARITIN) 10 MG tablet Take 10 mg by mouth every morning.     [provider]  Magnesium Cl-Calcium Carbonate (SLOW-MAG PO) Take 1 tablet by mouth every morning.    [provider]  metoprolol succinate (TOPROL-XL) 25 MG 24 hr tablet TAKE (1) TABLET BY MOUTH ONCE DAILY. 03/30/17   Herminio Commons, MD  Multiple Vitamins-Minerals (MULTIVITAMINS THER. W/MINERALS) TABS Take 1 tablet by mouth daily.     [provider]  nitroGLYCERIN (NITROSTAT) 0.4 MG SL tablet Place 1 tablet (0.4 mg total) under the tongue every 5 (five) minutes as needed for chest pain. 03/16/15   Penland, Kelby Fam, MD  omeprazole (PRILOSEC) 40 MG capsule Take 1 capsule (40 mg total) by mouth daily. 02/17/17   Twana First, MD  oxyCODONE-acetaminophen (PERCOCET/ROXICET) 5-325 MG tablet 1 or 2 tabs PO q6h prn pain Patient taking differently: Take 1-2 tablets by mouth every 6 (six) hours as needed for moderate pain.  01/29/17   Francine Graven, DO  potassium chloride SA  (K-DUR,KLOR-CON) 20 MEQ tablet Take 2 tablets (40 mEq total) by mouth 2 (two) times daily. 02/27/17   Twana First, MD  sodium bicarbonate 650 MG tablet Take 1,300 mg by mouth 3 (three) times daily.    [provider]  sulfamethoxazole-trimethoprim (BACTRIM DS,SEPTRA DS) 800-160 MG tablet TAKE ONE TABLET BY MOUTH EVERY MONDAY, WEDNESDAY, AND FRIDAY. 03/17/17   Holley Bouche, NP  torsemide (DEMADEX) 20 MG tablet TAKE 2 TABLETS BY MOUTH EACH MORNING AND 1 TABLET EACH EVENING. 04/22/16   Setzer, Rona Ravens, NP  Vitamin D, Ergocalciferol, (DRISDOL) 50000 units CAPS capsule TAKE 1 CAPSULE BY MOUTH EVERY THIRTY DAYS. 03/31/16   Baird Cancer, PA-C  XARELTO 20  MG TABS tablet TAKE 1 TABLET BY MOUTH DAILY. 09/02/16   Herminio Commons, MD    Family History Family History  Problem Relation Age of Onset  . Heart disease Mother   . Cancer Mother   . Diabetes Father   . Arthritis Unknown   . Anesthesia problems Neg Hx   . Hypotension Neg Hx   . Malignant hyperthermia Neg Hx   . Pseudochol deficiency Neg Hx     Social History Social History   Tobacco Use  . Smoking status: Former Smoker    Packs/day: 0.25    Years: 1.00    Pack years: 0.25    Types: Cigarettes, Cigars    Last attempt to quit: 05/17/2001    Years since quitting: 15.9  . Smokeless tobacco: Never Used  Substance Use Topics  . Alcohol use: No    Alcohol/week: 0.0 oz  . Drug use: No     Allergies   Patient has no known allergies.   Review of Systems Review of Systems  Eyes:       Eye redness times 2 weeks, asymptomatic  Genitourinary: Positive for scrotal swelling.  Musculoskeletal: Positive for arthralgias.       Status post right great toe surgery last week.  Walks with cane  Skin: Positive for wound.       Open wound on scrotum  Allergic/Immunologic: Positive for immunocompromised state.       Diabetic, multiple myeloma patient  All other systems reviewed and are negative.    Physical  Exam Updated Vital Signs BP (!) 120/42   Pulse 84   Resp 20   Ht '6\' 3"'  (1.905 m)   Wt (!) 167.8 kg (370 lb)   SpO2 98%   BMI 46.25 kg/m   Physical Exam  Constitutional:  Chronically ill-appearing  HENT:  Head: Normocephalic and atraumatic.  Eyes: Conjunctivae are normal. Pupils are equal, round, and reactive to light.  Neck: Neck supple. No tracheal deviation present. No thyromegaly present.  Cardiovascular: Normal rate and regular rhythm.  No murmur heard. Pulmonary/Chest: Effort normal and breath sounds normal.  Abdominal: Soft. Bowel sounds are normal. He exhibits no distension. There is no tenderness.  Morbidly obese  Genitourinary:  Genitourinary Comments:  Penis swollen, uncircumcizedScrotum grossly swollen.  There is an approximately 1 cm open wound along the raphae posteriorly with corresponding tenderness  Musculoskeletal: Normal range of motion. He exhibits no edema or tenderness.  Neurological: He is alert. Coordination normal.  Skin: Skin is warm and dry. No rash noted.  Psychiatric: He has a normal mood and affect.  Nursing note and vitals reviewed.    ED Treatments / Results  Labs (all labs ordered are listed, but only abnormal results are displayed) Labs Reviewed  BASIC METABOLIC PANEL  CBC WITH DIFFERENTIAL/PLATELET  URINALYSIS, ROUTINE W REFLEX MICROSCOPIC    EKG  EKG Interpretation None      Results for orders placed or performed during the hospital encounter of 46/65/99  Basic metabolic panel  Result Value Ref Range   Sodium 135 135 - 145 mmol/L   Potassium 4.0 3.5 - 5.1 mmol/L   Chloride 103 101 - 111 mmol/L   CO2 23 22 - 32 mmol/L   Glucose, Bld 229 (H) 65 - 99 mg/dL   BUN 41 (H) 6 - 20 mg/dL   Creatinine, Ser 1.91 (H) 0.61 - 1.24 mg/dL   Calcium 7.8 (L) 8.9 - 10.3 mg/dL   GFR calc non Af Amer 37 (L) >60  mL/min   GFR calc Af Amer 43 (L) >60 mL/min   Anion gap 9 5 - 15  CBC with Differential/Platelet  Result Value Ref Range   WBC  16.6 (H) 4.0 - 10.5 K/uL   RBC 3.04 (L) 4.22 - 5.81 MIL/uL   Hemoglobin 9.0 (L) 13.0 - 17.0 g/dL   HCT 28.9 (L) 39.0 - 52.0 %   MCV 95.1 78.0 - 100.0 fL   MCH 29.6 26.0 - 34.0 pg   MCHC 31.1 30.0 - 36.0 g/dL   RDW 16.9 (H) 11.5 - 15.5 %   Platelets 157 150 - 400 K/uL   Neutrophils Relative % 88 %   Neutro Abs 14.7 (H) 1.7 - 7.7 K/uL   Lymphocytes Relative 6 %   Lymphs Abs 1.0 0.7 - 4.0 K/uL   Monocytes Relative 5 %   Monocytes Absolute 0.8 0.1 - 1.0 K/uL   Eosinophils Relative 1 %   Eosinophils Absolute 0.1 0.0 - 0.7 K/uL   Basophils Relative 0 %   Basophils Absolute 0.0 0.0 - 0.1 K/uL   US Scrotum  Result Date: 04/13/2017 CLINICAL DATA:  Pain and swelling for several days left greater than right EXAM: SCROTAL ULTRASOUND DOPPLER ULTRASOUND OF THE TESTICLES TECHNIQUE: Complete ultrasound examination of the testicles, epididymis, and other scrotal structures was performed. Color and spectral Doppler ultrasound were also utilized to evaluate blood flow to the testicles. COMPARISON:  None. FINDINGS: Right testicle Measurements: 3.5 x 2.3 x 2.5 cm. No mass or microlithiasis visualized. Left testicle Measurements: 3.5 x 2.4 x 2.4 cm. No mass or microlithiasis visualized. Right epididymis:  Normal in size and appearance. Left epididymis:  Prominent with slight increased vascularity. Hydrocele:  Small hydroceles noted bilaterally. Varicocele:  None visualized. Pulsed Doppler interrogation of both testes demonstrates normal low resistance arterial and venous waveforms bilaterally. Note is made of scrotal thickening bilaterally. IMPRESSION: Prominent epididymis on the left with slight increased vascularity. This may represent a component of epididymitis. Normal-appearing testicles bilaterally. Small hydroceles bilaterally with scrotal skin thickening. Electronically Signed   By: Inez Catalina M.D.   On: 04/13/2017 12:37   US Pelvic Doppler (torsion R/o Or Mass Arterial Flow)  Result Date:  04/13/2017 CLINICAL DATA:  Pain and swelling for several days left greater than right EXAM: SCROTAL ULTRASOUND DOPPLER ULTRASOUND OF THE TESTICLES TECHNIQUE: Complete ultrasound examination of the testicles, epididymis, and other scrotal structures was performed. Color and spectral Doppler ultrasound were also utilized to evaluate blood flow to the testicles. COMPARISON:  None. FINDINGS: Right testicle Measurements: 3.5 x 2.3 x 2.5 cm. No mass or microlithiasis visualized. Left testicle Measurements: 3.5 x 2.4 x 2.4 cm. No mass or microlithiasis visualized. Right epididymis:  Normal in size and appearance. Left epididymis:  Prominent with slight increased vascularity. Hydrocele:  Small hydroceles noted bilaterally. Varicocele:  None visualized. Pulsed Doppler interrogation of both testes demonstrates normal low resistance arterial and venous waveforms bilaterally. Note is made of scrotal thickening bilaterally. IMPRESSION: Prominent epididymis on the left with slight increased vascularity. This may represent a component of epididymitis. Normal-appearing testicles bilaterally. Small hydroceles bilaterally with scrotal skin thickening. Electronically Signed   By: Inez Catalina M.D.   On: 04/13/2017 12:37   Dg Foot Complete Right  Result Date: 04/08/2017 CLINICAL DATA:  Status post surgery. EXAM: RIGHT FOOT COMPLETE - 3+ VIEW COMPARISON:  04/02/2017 FINDINGS: Generalized osteopenia. Soft tissue wound at the tip of the great toe. Partial ostectomy of the distal aspect of the first distal phalanx. Mild  osteoarthritis of the first MTP joint. No fracture or dislocation. Mild osteoarthritis of the talonavicular joint. IMPRESSION: 1. Soft tissue wound at the tip of the great toe. Partial ostectomy of the distal aspect of the first distal phalanx. Electronically Signed   By: Kathreen Devoid   On: 04/08/2017 09:17   Dg Foot Complete Right  Result Date: 04/02/2017 CLINICAL DATA:  Pain.  Recent ulcer first toe EXAM: RIGHT  FOOT COMPLETE - 3+ VIEW COMPARISON:  MRI right forefoot January 16, 2017 FINDINGS: Frontal, oblique, and lateral views obtained. A bandage overlies the distal aspect of the first digit. There is no appreciable acute fracture or dislocation. There is an old fracture of second proximal phalanx with remodeling. Joint spaces appear normal. No erosive change or bony destruction. No soft tissue air seen. There is extensive vascular calcification throughout the ankle and foot. There is a spur arising from the inferior calcaneus. There is a benign appearing exostosis arising from the dorsal distal talus. There is mild soft tissue swelling over the dorsal forefoot. IMPRESSION: Soft tissue swelling over dorsal forefoot. Old healed fracture second proximal phalanx. No acute fracture or dislocation. No erosive change or bony destruction. No soft tissue air or radiopaque foreign body evident beyond bandage overlying first digit. Inferior calcaneal spur noted. Widespread vascular calcification consistent with known diabetes mellitus. Electronically Signed   By: Lowella Grip III M.D.   On: 04/02/2017 15:58   Radiology No results found.  Procedures Procedures (including critical care time)  Medications Ordered in ED Medications  fentaNYL (SUBLIMAZE) injection 100 mcg (not administered)     Initial Impression / Assessment and Plan / ED Course  I have reviewed the triage vital signs and the nursing notes.  Pertinent labs & imaging results that were available during my care of the patient were reviewed by me and considered in my medical decision making (see chart for details).     1220 p.m. pain improved after treatment with intravenous fentanyl.  I consulted hospitalist.  I feel the patient should be observed overnight in light of immunocompromise state, and severe pain.  IV Levaquin ordered.  Pharmacist suggest Levaquin 500 mg every 48 hours in light of renal insufficiency.  I also  spoke with Dr.Budzyn,  urologist on call, who feels that it is reasonable to hospitalize this patient given his immunocompromise state, leukocytosis and severe pain..  Renal insufficiency is chronic.  Anemia is chronic  Final Clinical Impressions(s) / ED Diagnoses  Diagnosis #1 scrotal pain #2 anemia #3 hyperglycemia #4 chronicrenal insufficiency Final diagnoses:  None    ED Discharge Orders    None       Orlie Dakin, MD 04/13/17 1346

## 2017-04-13 NOTE — ED Notes (Signed)
Report called and given to [facility].

## 2017-04-13 NOTE — Progress Notes (Signed)
Pharmacy Note:  Initial antibiotics for [facility] ordered by EDP for UTI.  Estimated Creatinine Clearance: 70.2 mL/min (A) (by C-G formula based on SCr of 1.91 mg/dL (H)).   No Known Allergies  Vitals:   04/13/17 1309 04/13/17 1410  BP: 120/60 (!) 140/107  Pulse: 96 (!) 102  Resp: 20 20  SpO2: 97% 98%    Anti-infectives (From admission, onward)   Start     Dose/Rate Route Frequency Ordered Stop   04/13/17 1415  acyclovir (ZOVIRAX) tablet 400 mg     400 mg Oral  Every morning - 10a 04/13/17 1412     04/13/17 1415  sulfamethoxazole-trimethoprim (BACTRIM DS,SEPTRA DS) 800-160 MG per tablet 1 tablet     1 tablet Oral Once per day on Mon Wed Fri 04/13/17 1412     04/13/17 1300  levofloxacin (LEVAQUIN) IVPB 500 mg     500 mg 100 mL/hr over 60 Minutes Intravenous  Once 04/13/17 1255       Plan: Initial doses of LEVAQUIN 500mg  X 1 ordered by EDP. F/U admission orders for further dosing if therapy continued.  Ena Dawley, [facility] 04/13/2017 2:15 PM

## 2017-04-14 ENCOUNTER — Inpatient Hospital Stay (HOSPITAL_COMMUNITY): Payer: BLUE CROSS/BLUE SHIELD

## 2017-04-14 ENCOUNTER — Encounter (HOSPITAL_COMMUNITY): Payer: Self-pay | Admitting: Anesthesiology

## 2017-04-14 ENCOUNTER — Inpatient Hospital Stay (HOSPITAL_COMMUNITY): Payer: BLUE CROSS/BLUE SHIELD | Admitting: Anesthesiology

## 2017-04-14 ENCOUNTER — Encounter (HOSPITAL_COMMUNITY)
Admission: EM | Disposition: A | Discharge status: Skilled Nursing Facility | Payer: Self-pay | Source: Home / Self Care | Attending: Internal Medicine

## 2017-04-14 ENCOUNTER — Other Ambulatory Visit: Payer: Self-pay | Admitting: Urology

## 2017-04-14 DIAGNOSIS — I130 Hypertensive heart and chronic kidney disease with heart failure and stage 1 through stage 4 chronic kidney disease, or unspecified chronic kidney disease: Secondary | ICD-10-CM | POA: Diagnosis present

## 2017-04-14 DIAGNOSIS — C9000 Multiple myeloma not having achieved remission: Secondary | ICD-10-CM | POA: Diagnosis present

## 2017-04-14 DIAGNOSIS — N493 Fournier gangrene: Secondary | ICD-10-CM | POA: Diagnosis present

## 2017-04-14 DIAGNOSIS — N451 Epididymitis: Secondary | ICD-10-CM | POA: Diagnosis not present

## 2017-04-14 DIAGNOSIS — Z7901 Long term (current) use of anticoagulants: Secondary | ICD-10-CM | POA: Diagnosis not present

## 2017-04-14 DIAGNOSIS — E119 Type 2 diabetes mellitus without complications: Secondary | ICD-10-CM | POA: Diagnosis not present

## 2017-04-14 DIAGNOSIS — E118 Type 2 diabetes mellitus with unspecified complications: Secondary | ICD-10-CM | POA: Diagnosis not present

## 2017-04-14 DIAGNOSIS — N179 Acute kidney failure, unspecified: Secondary | ICD-10-CM | POA: Diagnosis present

## 2017-04-14 DIAGNOSIS — N492 Inflammatory disorders of scrotum: Secondary | ICD-10-CM | POA: Diagnosis not present

## 2017-04-14 DIAGNOSIS — I5022 Chronic systolic (congestive) heart failure: Secondary | ICD-10-CM | POA: Diagnosis present

## 2017-04-14 DIAGNOSIS — W1849XA Other slipping, tripping and stumbling without falling, initial encounter: Secondary | ICD-10-CM | POA: Diagnosis present

## 2017-04-14 DIAGNOSIS — E1165 Type 2 diabetes mellitus with hyperglycemia: Secondary | ICD-10-CM | POA: Diagnosis present

## 2017-04-14 DIAGNOSIS — T383X5A Adverse effect of insulin and oral hypoglycemic [antidiabetic] drugs, initial encounter: Secondary | ICD-10-CM | POA: Diagnosis not present

## 2017-04-14 DIAGNOSIS — I483 Typical atrial flutter: Secondary | ICD-10-CM | POA: Diagnosis not present

## 2017-04-14 DIAGNOSIS — Z794 Long term (current) use of insulin: Secondary | ICD-10-CM | POA: Diagnosis not present

## 2017-04-14 DIAGNOSIS — Z87891 Personal history of nicotine dependence: Secondary | ICD-10-CM | POA: Diagnosis not present

## 2017-04-14 DIAGNOSIS — I10 Essential (primary) hypertension: Secondary | ICD-10-CM | POA: Diagnosis not present

## 2017-04-14 DIAGNOSIS — K219 Gastro-esophageal reflux disease without esophagitis: Secondary | ICD-10-CM | POA: Diagnosis present

## 2017-04-14 DIAGNOSIS — G4733 Obstructive sleep apnea (adult) (pediatric): Secondary | ICD-10-CM | POA: Diagnosis present

## 2017-04-14 DIAGNOSIS — I480 Paroxysmal atrial fibrillation: Secondary | ICD-10-CM | POA: Diagnosis present

## 2017-04-14 DIAGNOSIS — R6520 Severe sepsis without septic shock: Secondary | ICD-10-CM | POA: Diagnosis present

## 2017-04-14 DIAGNOSIS — D649 Anemia, unspecified: Secondary | ICD-10-CM | POA: Diagnosis present

## 2017-04-14 DIAGNOSIS — E160 Drug-induced hypoglycemia without coma: Secondary | ICD-10-CM | POA: Diagnosis not present

## 2017-04-14 DIAGNOSIS — I4892 Unspecified atrial flutter: Secondary | ICD-10-CM | POA: Diagnosis present

## 2017-04-14 DIAGNOSIS — Z79899 Other long term (current) drug therapy: Secondary | ICD-10-CM | POA: Diagnosis not present

## 2017-04-14 DIAGNOSIS — N183 Chronic kidney disease, stage 3 (moderate): Secondary | ICD-10-CM | POA: Diagnosis present

## 2017-04-14 DIAGNOSIS — Z6841 Body Mass Index (BMI) 40.0 and over, adult: Secondary | ICD-10-CM | POA: Diagnosis not present

## 2017-04-14 DIAGNOSIS — Z9989 Dependence on other enabling machines and devices: Secondary | ICD-10-CM | POA: Diagnosis not present

## 2017-04-14 DIAGNOSIS — I252 Old myocardial infarction: Secondary | ICD-10-CM | POA: Diagnosis not present

## 2017-04-14 DIAGNOSIS — A419 Sepsis, unspecified organism: Secondary | ICD-10-CM | POA: Diagnosis present

## 2017-04-14 DIAGNOSIS — Y92003 Bedroom of unspecified non-institutional (private) residence as the place of occurrence of the external cause: Secondary | ICD-10-CM | POA: Diagnosis not present

## 2017-04-14 DIAGNOSIS — Z955 Presence of coronary angioplasty implant and graft: Secondary | ICD-10-CM | POA: Diagnosis not present

## 2017-04-14 DIAGNOSIS — I2510 Atherosclerotic heart disease of native coronary artery without angina pectoris: Secondary | ICD-10-CM | POA: Diagnosis present

## 2017-04-14 DIAGNOSIS — E785 Hyperlipidemia, unspecified: Secondary | ICD-10-CM | POA: Diagnosis present

## 2017-04-14 HISTORY — PX: INCISION AND DRAINAGE ABSCESS: SHX5864

## 2017-04-14 LAB — CBC
HCT: 26.8 % — ABNORMAL LOW (ref 39.0–52.0)
Hemoglobin: 8.5 g/dL — ABNORMAL LOW (ref 13.0–17.0)
MCH: 30 pg (ref 26.0–34.0)
MCHC: 31.7 g/dL (ref 30.0–36.0)
MCV: 94.7 fL (ref 78.0–100.0)
Platelets: 127 10*3/uL — ABNORMAL LOW (ref 150–400)
RBC: 2.83 MIL/uL — ABNORMAL LOW (ref 4.22–5.81)
RDW: 17.4 % — ABNORMAL HIGH (ref 11.5–15.5)
WBC: 16.4 10*3/uL — ABNORMAL HIGH (ref 4.0–10.5)

## 2017-04-14 LAB — MRSA PCR SCREENING: MRSA by PCR: NEGATIVE

## 2017-04-14 LAB — GLUCOSE, CAPILLARY
Glucose-Capillary: 205 mg/dL — ABNORMAL HIGH (ref 65–99)
Glucose-Capillary: 226 mg/dL — ABNORMAL HIGH (ref 65–99)
Glucose-Capillary: 369 mg/dL — ABNORMAL HIGH (ref 65–99)

## 2017-04-14 LAB — BASIC METABOLIC PANEL
Anion gap: 7 (ref 5–15)
BUN: 42 mg/dL — ABNORMAL HIGH (ref 6–20)
CO2: 24 mmol/L (ref 22–32)
Calcium: 7.8 mg/dL — ABNORMAL LOW (ref 8.9–10.3)
Chloride: 103 mmol/L (ref 101–111)
Creatinine, Ser: 2.36 mg/dL — ABNORMAL HIGH (ref 0.61–1.24)
GFR calc Af Amer: 33 mL/min — ABNORMAL LOW (ref >60)
GFR calc non Af Amer: 29 mL/min — ABNORMAL LOW (ref >60)
Glucose, Bld: 239 mg/dL — ABNORMAL HIGH (ref 65–99)
Potassium: 4.7 mmol/L (ref 3.5–5.1)
Sodium: 134 mmol/L — ABNORMAL LOW (ref 135–145)

## 2017-04-14 LAB — HIV ANTIBODY (ROUTINE TESTING W REFLEX): HIV Screen 4th Generation wRfx: NONREACTIVE

## 2017-04-14 SURGERY — INCISION AND DRAINAGE, ABSCESS
Anesthesia: General | Site: Scrotum

## 2017-04-14 MED ORDER — LACTATED RINGERS IV SOLN
INTRAVENOUS | Status: DC
  Administered 2017-04-14: 23:00:00 via INTRAVENOUS

## 2017-04-14 MED ORDER — SUCCINYLCHOLINE CHLORIDE 200 MG/10ML IV SOSY
PREFILLED_SYRINGE | INTRAVENOUS | Status: DC | PRN
  Administered 2017-04-14: 160 mg via INTRAVENOUS

## 2017-04-14 MED ORDER — MEPERIDINE HCL 25 MG/ML IJ SOLN: 6.2500 mg | INTRAMUSCULAR | Status: DC | PRN

## 2017-04-14 MED ORDER — ACETAMINOPHEN 10 MG/ML IV SOLN
INTRAVENOUS | Status: DC | PRN
  Administered 2017-04-14: 1000 mg via INTRAVENOUS

## 2017-04-14 MED ORDER — SODIUM CHLORIDE 0.9 % IR SOLN
Status: DC | PRN
  Administered 2017-04-14: 1000 mL

## 2017-04-14 MED ORDER — VANCOMYCIN HCL 10 G IV SOLR
1750.0000 mg | INTRAVENOUS | Status: DC
  Filled 2017-04-14: qty 1750

## 2017-04-14 MED ORDER — ONDANSETRON HCL 4 MG/2ML IJ SOLN
INTRAMUSCULAR | Status: AC
  Filled 2017-04-14: qty 2

## 2017-04-14 MED ORDER — PHENYLEPHRINE HCL 10 MG/ML IJ SOLN
INTRAMUSCULAR | Status: AC
  Filled 2017-04-14: qty 1

## 2017-04-14 MED ORDER — PHENYLEPHRINE 40 MCG/ML (10ML) SYRINGE FOR IV PUSH (FOR BLOOD PRESSURE SUPPORT)
PREFILLED_SYRINGE | INTRAVENOUS | Status: DC | PRN
  Administered 2017-04-14 (×5): 80 ug via INTRAVENOUS

## 2017-04-14 MED ORDER — PROPOFOL 10 MG/ML IV BOLUS
INTRAVENOUS | Status: DC | PRN
  Administered 2017-04-14: 160 mg via INTRAVENOUS

## 2017-04-14 MED ORDER — ACETAMINOPHEN 10 MG/ML IV SOLN
INTRAVENOUS | Status: AC
  Filled 2017-04-14: qty 100

## 2017-04-14 MED ORDER — FENTANYL CITRATE (PF) 100 MCG/2ML IJ SOLN
INTRAMUSCULAR | Status: AC
  Filled 2017-04-14: qty 2

## 2017-04-14 MED ORDER — ONDANSETRON HCL 4 MG/2ML IJ SOLN
INTRAMUSCULAR | Status: DC | PRN
  Administered 2017-04-14: 4 mg via INTRAVENOUS

## 2017-04-14 MED ORDER — SUCCINYLCHOLINE CHLORIDE 200 MG/10ML IV SOSY
PREFILLED_SYRINGE | INTRAVENOUS | Status: AC
  Filled 2017-04-14: qty 10

## 2017-04-14 MED ORDER — LENALIDOMIDE 5 MG PO CAPS: 5.0000 mg | ORAL_CAPSULE | Freq: Every day | ORAL | Status: DC

## 2017-04-14 MED ORDER — CHLORHEXIDINE GLUCONATE CLOTH 2 % EX PADS
6.0000 | MEDICATED_PAD | Freq: Every day | CUTANEOUS | Status: DC
  Administered 2017-04-16 – 2017-04-22 (×5): 6 via TOPICAL

## 2017-04-14 MED ORDER — PROMETHAZINE HCL 25 MG/ML IJ SOLN: 6.2500 mg | INTRAMUSCULAR | Status: DC | PRN

## 2017-04-14 MED ORDER — LIDOCAINE 2% (20 MG/ML) 5 ML SYRINGE
INTRAMUSCULAR | Status: AC
  Filled 2017-04-14: qty 5

## 2017-04-14 MED ORDER — LIDOCAINE 2% (20 MG/ML) 5 ML SYRINGE
INTRAMUSCULAR | Status: DC | PRN
  Administered 2017-04-14: 80 mg via INTRAVENOUS

## 2017-04-14 MED ORDER — HYDROMORPHONE HCL 1 MG/ML IJ SOLN
INTRAMUSCULAR | Status: AC
  Filled 2017-04-14: qty 1

## 2017-04-14 MED ORDER — PIPERACILLIN-TAZOBACTAM 3.375 G IVPB
INTRAVENOUS | Status: AC
  Filled 2017-04-14: qty 50

## 2017-04-14 MED ORDER — PIPERACILLIN-TAZOBACTAM 3.375 G IVPB
3.3750 g | Freq: Three times a day (TID) | INTRAVENOUS | Status: DC
  Administered 2017-04-14 – 2017-04-19 (×16): 3.375 g via INTRAVENOUS
  Filled 2017-04-14 (×14): qty 50

## 2017-04-14 MED ORDER — PHENYLEPHRINE 40 MCG/ML (10ML) SYRINGE FOR IV PUSH (FOR BLOOD PRESSURE SUPPORT)
PREFILLED_SYRINGE | INTRAVENOUS | Status: AC
  Filled 2017-04-14: qty 10

## 2017-04-14 MED ORDER — HYDROMORPHONE HCL 1 MG/ML IJ SOLN
0.5000 mg | INTRAMUSCULAR | Status: DC | PRN
  Administered 2017-04-15: 0.5 mg via INTRAVENOUS
  Filled 2017-04-14: qty 1

## 2017-04-14 MED ORDER — IOPAMIDOL (ISOVUE-300) INJECTION 61%
100.0000 mL | Freq: Once | INTRAVENOUS | Status: AC | PRN
  Administered 2017-04-14: 100 mL via INTRAVENOUS

## 2017-04-14 MED ORDER — VANCOMYCIN HCL IN DEXTROSE 1-5 GM/200ML-% IV SOLN
1000.0000 mg | INTRAVENOUS | Status: AC
  Administered 2017-04-14 (×2): 1000 mg via INTRAVENOUS
  Filled 2017-04-14 (×3): qty 200

## 2017-04-14 MED ORDER — HYDROMORPHONE HCL 1 MG/ML IJ SOLN
0.2500 mg | INTRAMUSCULAR | Status: DC | PRN
  Administered 2017-04-14 – 2017-04-15 (×5): 0.5 mg via INTRAVENOUS
  Filled 2017-04-14: qty 1

## 2017-04-14 MED ORDER — CLINDAMYCIN PHOSPHATE 900 MG/50ML IV SOLN: 900.0000 mg | Freq: Three times a day (TID) | INTRAVENOUS | Status: DC

## 2017-04-14 MED ORDER — FENTANYL CITRATE (PF) 100 MCG/2ML IJ SOLN
INTRAMUSCULAR | Status: DC | PRN
  Administered 2017-04-14 (×2): 50 ug via INTRAVENOUS

## 2017-04-14 MED ORDER — SODIUM CHLORIDE 0.9% FLUSH
10.0000 mL | INTRAVENOUS | Status: DC | PRN
  Administered 2017-04-20: 10 mL
  Filled 2017-04-14: qty 40

## 2017-04-14 MED ORDER — SODIUM CHLORIDE 0.9 % IV SOLN
INTRAVENOUS | Status: DC
  Administered 2017-04-14 – 2017-04-16 (×3): via INTRAVENOUS

## 2017-04-14 MED ORDER — PHENYLEPHRINE HCL 10 MG/ML IJ SOLN
INTRAVENOUS | Status: DC | PRN
  Administered 2017-04-14: 25 ug/min via INTRAVENOUS

## 2017-04-14 SURGICAL SUPPLY — 48 items
APL SKNCLS STERI-STRIP NONHPOA (GAUZE/BANDAGES/DRESSINGS)
BAG URINE DRAINAGE (UROLOGICAL SUPPLIES) ×2 IMPLANT
BENZOIN TINCTURE PRP APPL 2/3 (GAUZE/BANDAGES/DRESSINGS) IMPLANT
BLADE HEX COATED 2.75 (ELECTRODE) ×3 IMPLANT
BLADE SURG 15 STRL LF DISP TIS (BLADE) IMPLANT
BLADE SURG 15 STRL SS (BLADE)
BLADE SURG SZ10 CARB STEEL (BLADE) ×2 IMPLANT
BNDG GAUZE ELAST 4 BULKY (GAUZE/BANDAGES/DRESSINGS) ×2 IMPLANT
CATH FOLEY 2WAY SLVR  5CC 18FR (CATHETERS) ×1
CATH FOLEY 2WAY SLVR 5CC 18FR (CATHETERS) IMPLANT
COVER SURGICAL LIGHT HANDLE (MISCELLANEOUS) ×2 IMPLANT
DECANTER SPIKE VIAL GLASS SM (MISCELLANEOUS) IMPLANT
DRAPE LAPAROSCOPIC ABDOMINAL (DRAPES) ×2 IMPLANT
DRAPE LAPAROTOMY T 102X78X121 (DRAPES) IMPLANT
DRAPE LAPAROTOMY TRNSV 102X78 (DRAPE) IMPLANT
DRAPE POUCH INSTRU U-SHP 10X18 (DRAPES) IMPLANT
DRAPE SHEET LG 3/4 BI-LAMINATE (DRAPES) IMPLANT
ELECT PENCIL ROCKER SW 15FT (MISCELLANEOUS) ×2 IMPLANT
ELECT REM PT RETURN 15FT ADLT (MISCELLANEOUS) ×2 IMPLANT
EVACUATOR SILICONE 100CC (DRAIN) IMPLANT
GAUZE SPONGE 4X4 12PLY STRL (GAUZE/BANDAGES/DRESSINGS) ×2 IMPLANT
GLOVE BIOGEL PI IND STRL 7.0 (GLOVE) ×3 IMPLANT
GLOVE BIOGEL PI INDICATOR 7.0 (GLOVE) ×3
GLOVE EUDERMIC 7 POWDERFREE (GLOVE) ×2 IMPLANT
GOWN STRL REUS W/TWL LRG LVL3 (GOWN DISPOSABLE) ×2 IMPLANT
GOWN STRL REUS W/TWL XL LVL3 (GOWN DISPOSABLE) IMPLANT
HOLDER FOLEY CATH W/STRAP (MISCELLANEOUS) ×2 IMPLANT
KIT BASIN OR (CUSTOM PROCEDURE TRAY) ×3 IMPLANT
LEGGING LITHOTOMY PAIR STRL (DRAPES) ×1 IMPLANT
NEEDLE HYPO 25X1 1.5 SAFETY (NEEDLE) IMPLANT
NS IRRIG 1000ML POUR BTL (IV SOLUTION) ×2 IMPLANT
PACK BASIC VI WITH GOWN DISP (CUSTOM PROCEDURE TRAY) ×3 IMPLANT
PAD ABD 8X10 STRL (GAUZE/BANDAGES/DRESSINGS) ×2 IMPLANT
SOL PREP POV-IOD 4OZ 10% (MISCELLANEOUS) ×2 IMPLANT
SPONGE LAP 18X18 X RAY DECT (DISPOSABLE) ×4 IMPLANT
SPONGE LAP 4X18 X RAY DECT (DISPOSABLE) IMPLANT
STAPLER VISISTAT 35W (STAPLE) IMPLANT
STRIP CLOSURE SKIN 1/2X4 (GAUZE/BANDAGES/DRESSINGS) IMPLANT
SUT MNCRL AB 4-0 PS2 18 (SUTURE) IMPLANT
SUT VIC AB 3-0 SH 18 (SUTURE) IMPLANT
SWAB COLLECTION DEVICE MRSA (MISCELLANEOUS) ×2 IMPLANT
SWAB CULTURE ESWAB REG 1ML (MISCELLANEOUS) ×2 IMPLANT
SYR BULB IRRIGATION 50ML (SYRINGE) ×2 IMPLANT
SYR CONTROL 10ML LL (SYRINGE) IMPLANT
SYRINGE 20CC LL (MISCELLANEOUS) ×2 IMPLANT
TOWEL OR 17X26 10 PK STRL BLUE (TOWEL DISPOSABLE) ×2 IMPLANT
WATER STERILE IRR 1000ML POUR (IV SOLUTION) IMPLANT
YANKAUER SUCT BULB TIP 10FT TU (MISCELLANEOUS) ×1 IMPLANT

## 2017-04-14 NOTE — Evaluation (Signed)
Physical Therapy Evaluation Patient Details Name: [name] MRN: [id number] DOB: [birth year] Today's Date: 04/14/2017   History of Present Illness  [name] is a 56 y.o. male with PMH of PAF on (xarelto), HTN, DM, CKD, Multiple myeloma, previous h/o epidermitis presented with scrotal pain. Patient states that he was not able to ambulate well due to his recent toe surgery. He was sliding down from his bed and thinks he had some laceration in the posterior wall of his scrotum. He developed swelling and and pain at the scrotum for 3 days. Pain is worse with putting some weight or moving across the bed. He reports previous similar symptoms and diagnosed with epididymitis, treated with antibiotics last year. He denies fever, no chills, no acute chest pains, no shortness of breath. He reports chronic intermittent diarrhea. No vomiting, no abdominal pains. He has chronic leg edema and takes demedex.     Clinical Impression  Patient had much difficulty sitting up and scooting to bedside due to c/o severe scrotal pain with purulent drainage noted on linen.  Patient able to take a few steps using SPC and leaning on wall to transfer to wheelchair and tolerated sitting up with 3 pillow stacked on seat of wheelchair.  Patient will benefit from continued physical therapy in hospital and recommended venue below to increase strength, balance, endurance for safe ADLs and gait.    Follow Up Recommendations SNF;Supervision/Assistance - 24 hour    Equipment Recommendations  Rolling walker with 5" wheels(Heavy duty)    Recommendations for Other Services       Precautions / Restrictions Precautions Precautions: Fall Precaution Comments: scrotal wound Restrictions Weight Bearing Restrictions: No      Mobility  Bed Mobility Overal bed mobility: Needs Assistance Bed Mobility: Supine to Sit;Sit to Supine     Supine to sit: Mod assist;Max assist Sit to supine: Mod assist   General bed  mobility comments: had difficulty due to severe pain over scrotal area when sitting up  Transfers Overall transfer level: Needs assistance   Transfers: Sit to/from Stand;Stand Pivot Transfers Sit to Stand: Max assist;+2 safety/equipment Stand pivot transfers: Min assist       General transfer comment: had difficulty with sit to stands due to generalized weakness  Ambulation/Gait Ambulation/Gait assistance: Mod assist Ambulation Distance (Feet): 4 Feet Assistive device: Straight cane Gait Pattern/deviations: Decreased step length - left;Decreased step length - right;Decreased stride length   Gait velocity interpretation: Below normal speed for age/gender General Gait Details: Patient limited to a few steps with wide base of support and leaning on wall with LUE while using SPC with RUE  Stairs            Wheelchair Mobility    Modified Rankin (Stroke Patients Only)       Balance Overall balance assessment: Needs assistance Sitting-balance support: No upper extremity supported;Feet supported Sitting balance-Leahy Scale: Good Sitting balance - Comments: limited secondary to c/o severe scrotal pain Postural control: Left lateral lean Standing balance support: Single extremity supported;During functional activity Standing balance-Leahy Scale: Fair                               Pertinent Vitals/Pain Pain Assessment: Faces Faces Pain Scale: Hurts whole lot Pain Location: over scrotum when sitting up Pain Descriptors / Indicators: Pressure;Grimacing;Guarding;Sharp Pain Intervention(s): Limited activity within patient's tolerance;Monitored during session    Home Living Family/patient expects to be discharged to:: Private residence Living  Arrangements: Spouse/significant other Available Help at Discharge: Family Type of Home: House Home Access: Stairs to enter Entrance Stairs-Rails: Right;Left;Can reach both Entrance Stairs-Number of Steps: 7 to enter  house on 3rd level, first 4 steps has bilateral siderails, last 3 has no rails Home Layout: Multi-level Home Equipment: Cane - single point;Wheelchair - manual      Prior Function Level of Independence: Independent with assistive device(s)         Comments: 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, museum/gallery        Extremity/Trunk Assessment   Upper Extremity Assessment Upper Extremity Assessment: Generalized weakness    Lower Extremity Assessment Lower Extremity Assessment: Generalized weakness    Cervical / Trunk Assessment Cervical / Trunk Assessment: Normal  Communication   Communication: No difficulties  Cognition Arousal/Alertness: Awake/alert Behavior During Therapy: WFL for tasks assessed/performed Overall Cognitive Status: Within Functional Limits for tasks assessed                                        General Comments      Exercises     Assessment/Plan    PT Assessment Patient needs continued PT services  PT Problem List Decreased strength;Decreased activity tolerance;Decreased balance;Decreased mobility       PT Treatment Interventions Gait training;Functional mobility training;Stair training;Therapeutic activities;Therapeutic exercise;Patient/family education    PT Goals (Current goals can be found in the Care Plan section)  Acute Rehab PT Goals Patient Stated Goal: return home PT Goal Formulation: With patient/family Time For Goal Achievement: 04/14/17 Potential to Achieve Goals: Good    Frequency Min 3X/week   Barriers to discharge        Co-evaluation               AM-PAC PT "6 Clicks" Daily Activity  Outcome Measure Difficulty turning over in bed (including adjusting bedclothes, sheets and blankets)?: A Lot Difficulty moving from lying on back to sitting on the side of the bed? : A Lot Difficulty sitting down on and standing up from a chair with arms (e.g., wheelchair, bedside commode, etc,.)?: A  Lot Help needed moving to and from a bed to chair (including a wheelchair)?: A Lot Help needed walking in hospital room?: A Lot Help needed climbing 3-5 steps with a railing? : Total 6 Click Score: 11    End of Session   Activity Tolerance: Patient limited by fatigue;Patient limited by pain Patient left: in chair;with call bell/phone within reach;with family/visitor present Nurse Communication: Mobility status PT Visit Diagnosis: Unsteadiness on feet (R26.81);Other abnormalities of gait and mobility (R26.89);Muscle weakness (generalized) (M62.81)    Time: 6789-3810 PT Time Calculation (min) (ACUTE ONLY): 37 min   Charges:   PT Evaluation $PT Eval Moderate Complexity: 1 Mod PT Treatments $Therapeutic Activity: 8-22 mins   PT G Codes:   PT G-Codes **NOT FOR INPATIENT CLASS** Functional Assessment Tool Used: AM-PAC 6 Clicks Basic Mobility Functional Limitation: Mobility: Walking and moving around Mobility: Walking and Moving Around Current Status (F7510): At least 60 percent but less than 80 percent impaired, limited or restricted Mobility: Walking and Moving Around Goal Status [phone]): At least 60 percent but less than 80 percent impaired, limited or restricted Mobility: Walking and Moving Around Discharge Status [phone]): At least 60 percent but less than 80 percent impaired, limited or restricted    2:16 PM, 04/14/17 Lonell Grandchild, MPT  Physical Therapist with [facility] [phone] office [phone] mobile phone

## 2017-04-14 NOTE — Interval H&P Note (Signed)
History and Physical Interval Note:  04/14/2017 8:02 PM  [name]  has presented today for surgery, with the diagnosis of scrotal abcess  The various methods of treatment have been discussed with the patient and family. After consideration of risks, benefits and other options for treatment, the patient has consented to  Procedure(s): INCISION AND DRAINAGE ABSCESS (N/A) as a surgical intervention .  The patient's history has been reviewed, patient examined, no change in status, stable for surgery.  I have reviewed the patient's chart and labs.  Questions were answered to the patient's satisfaction.     [name]

## 2017-04-14 NOTE — Anesthesia Preprocedure Evaluation (Addendum)
Anesthesia Evaluation  Patient identified by MRN, date of birth, ID band Patient awake    Reviewed: Allergy & Precautions, NPO status , Patient's Chart, lab work & pertinent test results  Airway Mallampati: I  TM Distance: >3 FB Neck ROM: Full    Dental  (+) Teeth Intact, Dental Advisory Given   Pulmonary sleep apnea , former smoker,    breath sounds clear to auscultation       Cardiovascular hypertension, + Past MI  + dysrhythmias  Rhythm:Regular Rate:Normal     Neuro/Psych  Neuromuscular disease    GI/Hepatic GERD  Medicated,  Endo/Other  diabetes, Type 2, Insulin Dependent, Oral Hypoglycemic Agents  Renal/GU Renal disease     Musculoskeletal  (+) Arthritis , Osteoarthritis,    Abdominal (+) + obese,   Peds  Hematology   Anesthesia Other Findings   Reproductive/Obstetrics                            Anesthesia Physical Anesthesia Plan  ASA: III  Anesthesia Plan: General   Post-op Pain Management:    Induction: Intravenous, Rapid sequence and Cricoid pressure planned  PONV Risk Score and Plan: 3 and Ondansetron and Dexamethasone  Airway Management Planned: Oral ETT  Additional Equipment:   Intra-op Plan:   Post-operative Plan: Extubation in OR  Informed Consent: I have reviewed the patients History and Physical, chart, labs and discussed the procedure including the risks, benefits and alternatives for the proposed anesthesia with the patient or authorized representative who has indicated his/her understanding and acceptance.   Dental advisory given  Plan Discussed with: CRNA  Anesthesia Plan Comments:         Anesthesia Quick Evaluation

## 2017-04-14 NOTE — Op Note (Signed)
Operative Note  Preoperative diagnosis:  1.  Fournier's gangrene of the scrotum  Postoperative diagnosis: 1.  Fournier's gangrene of the scrotum  Procedure(s): 1.  Incision and debridement of necrotic scrotal skin and drainage of scrotal abscess  Surgeon: Ellison Hughs, MD  Assistants:  None  Anesthesia:  Gen. endotracheal  Complications:  None  EBL:  200 mL  Specimens: 1. Necrotic scrotal skin  Drains/Catheters: 1.  Foley catheter  Intraoperative findings:  Extensive skin necrosis and breakdown involving the posterior scrotum extending along the posterior right and left hemiscrotum. The testicles were appeared uninvolved with infection.  Indication:  [name] is a 56 y.o. male with Fournier's gangrene of the scrotum. He was initially brought to [facility] secondary to worsening posterior scrotal pain for the past 72 hours. He had a scrotal ultrasound performed on 04/13/2017 that demonstrated a loculated scrotal abscess was signs concerning for of left epididymitis. Following a course of broad-spectrum antibiotics, the patient's clinical status showed little improvement. He eventually had a CT of the abdomen/pelvis with contrast 04/14/2017 that demonstrated air within the scrotal skin extending along the right hemiscrotum, consistent with Fournier's gangrene. He has been consented for the above procedures, voices understanding and wishes to proceed.  Description of procedure:  After informed consent was obtained, the patient was brought to the operating room and general endotracheal anesthesia was a Company secretary. The patient was placed in the dorsolithotomy position prepped and draped in usual sterile fashion. A timeout was then performed. A Foley catheter was placed with immediate return of cloudy urine. Foley catheter was then placed to gravity drainage.  Full inspection of the patient's scrotum and perineum revealed extensive necrosis along the posterior  aspects of the scrotum. The necrosis did not appear to extend into the perineal tissue. A 15 blade scalpel was then used to incise the necrotic-appearing skin involving the posterior scrotum. Once incised, the tissue appeared avascular and necrotic. This incision was then extended approximately 15 cm cephalad until well vascularized and healthy appearing scrotal skin was identified. A section of posterior scrotum measuring approximately 15 x 5 x 3 cm was excised sharply and sent for tissue culture. Swab cultures for aerobic and anaerobic organisms were obtained and sent for analysis.   Using a combination of blunt dissection, sharp excision and electrocautery, all necrotic tissue involving the right and left hemiscrotum was excised. The area of necrosis was more extensive on the right side and extended up to the upper portion of the hemiscrotum. Both testicles were identified and neither appeared to be involved with infection. Hemostasis was achieved as best as possible given that the patient is currently anticoagulated. The scrotal wound was left open and packed with a total of 4 Kerlix gauze rolls in a wet-to-dry fashion. The scrotum was then covered with multiple ABD pads and mesh panties were placed.  Plan:  Continue broad-spectrum antibiotics with vancomycin and Zosyn. I will make the patient nothing by mouth at 3 AM on 04/15/2017 and plan to take him back to the operating room tomorrow afternoon for a dressing change and possibly more debridement of his scrotal wound.  Wound care has been consulted to help manage his scrotal wound and I greatly appreciate their assistance.  He will likely need long term wound care over the course of the next several days/weeks.

## 2017-04-14 NOTE — H&P (View-Only) (Signed)
Urology Consult  Requesting physician: Dessa Phi, DO  Chief Complaint: Scrotal pain  History of Present Illness: [name] is a 56 y.o. male with multiple medical co-morbidities presents with CT and ultrasound findings concerning for Fournier's gangrene.  The patient states that he recently had foot surgery and was unable to ambulate well post-operatively.  While trying to slide out of bed 3 days ago, the patient states that he may have lacerated his scrotum.  Since that time, he has developed worsening scrotal pain associated with general malaise.  He has been receiving broad spectrum antibiotics over the past 24 hours while at [facility] with little improvement in his clinical picture.      Past Medical History:  Diagnosis Date  . Anemia   . Arteriosclerotic cardiovascular disease (ASCVD)    MI-2000s; stent to the proximal LAD and diagonal in 2001; stress nuclear in 2008-impaired exercise capacity, left ventricular dilatation, moderately to severely depressed EF, apical, inferior and anteroseptal scar  . Arthritis   . Atrial flutter ([place])   . 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 Cellulitis of leg    both legs  . Chronic diarrhea   . Chronic kidney disease, stage 3, mod decreased GFR (HCC)    Creatinine of 1.84 in 06/2011 and 1.5 in 07/2011  . Diabetes mellitus    Insulin  . Dysrhythmia    AFlutter  . GERD (gastroesophageal reflux disease)   . Gout   . Hyperlipidemia   . Hypertension   . Injection site reaction   . Multiple myeloma 07/01/2011  . Myocardial infarction ([place]) 2000  . Obesity   . Pedal edema    Venous insufficiency  . Sleep apnea    uses cpap  . Ulcer     Past Surgical History:  Procedure Laterality Date  . ABSCESS DRAINAGE     Scrotal  . BONE MARROW BIOPSY  05/13/11  . CARDIAC CATHETERIZATION     cardiac stent  . INCISION AND DRAINAGE ABSCESS ANAL    . LAPAROSCOPIC GASTRIC BANDING  2006   has been removed  . WRIST SURGERY     Left; removal  of bone fragment    Allergies: No Known Allergies  Family History  Problem Relation Age of Onset  . Heart disease Mother   . Cancer Mother   . Diabetes Father   . Arthritis Unknown   . Anesthesia problems Neg Hx   . Hypotension Neg Hx   . Malignant hyperthermia Neg Hx   . Pseudochol deficiency Neg Hx     Social History:  reports that he quit smoking about 15 years ago. His smoking use included cigarettes and cigars. He has a 0.25 pack-year smoking history. he has never used smokeless tobacco. He reports that he does not drink alcohol or use drugs.  ROS: A complete review of systems was performed.  All systems are negative except for pertinent findings as noted.  Physical Exam:  Vital signs in last 24 hours: Temp:  [98.2 F (36.8 C)-101.1 F (38.4 C)] 101.1 F (38.4 C) (11/13 1906) Pulse Rate:  [87-98] 92 (11/13 1903) Resp:  [16-21] 21 (11/13 1903) BP: (111-168)/(67-147) 168/147 (11/13 1903) SpO2:  [91 %-99 %] 95 % (11/13 1903) Constitutional:  Alert and oriented, No acute distress Cardiovascular: Regular rate and rhythm, No JVD Respiratory: Normal respiratory effort, Lungs clear bilaterally GI: Abdomen is soft, nontender, nondistended, no abdominal masses GU: The scrotum is diffusely tender to palpation, endurated and erythematous.  There is crepitus along the  left hemiscrotum with no signs of skin necrosis. Lymphatic: No lymphadenopathy Neurologic: Grossly intact, no focal deficits Psychiatric: Normal mood and affect  Laboratory Data:  Recent Labs    04/13/17 1143 04/14/17 0530  WBC 16.6* 16.4*  HGB 9.0* 8.5*  HCT 28.9* 26.8*  PLT 157 127*    Recent Labs    04/13/17 1143 04/14/17 0530  NA 135 134*  K 4.0 4.7  CL 103 103  GLUCOSE 229* 239*  BUN 41* 42*  CALCIUM 7.8* 7.8*  CREATININE 1.91* 2.36*     Results for orders placed or performed during the hospital encounter of 04/13/17 (from the past 24 hour(s))  Glucose, capillary     Status: Abnormal    Collection Time: 04/13/17  9:20 PM  Result Value Ref Range   Glucose-Capillary 232 (H) 65 - 99 mg/dL  CBC     Status: Abnormal   Collection Time: 04/14/17  5:30 AM  Result Value Ref Range   WBC 16.4 (H) 4.0 - 10.5 K/uL   RBC 2.83 (L) 4.22 - 5.81 MIL/uL   Hemoglobin 8.5 (L) 13.0 - 17.0 g/dL   HCT 26.8 (L) 39.0 - 52.0 %   MCV 94.7 78.0 - 100.0 fL   MCH 30.0 26.0 - 34.0 pg   MCHC 31.7 30.0 - 36.0 g/dL   RDW 17.4 (H) 11.5 - 15.5 %   Platelets 127 (L) 150 - 400 K/uL  Basic metabolic panel     Status: Abnormal   Collection Time: 04/14/17  5:30 AM  Result Value Ref Range   Sodium 134 (L) 135 - 145 mmol/L   Potassium 4.7 3.5 - 5.1 mmol/L   Chloride 103 101 - 111 mmol/L   CO2 24 22 - 32 mmol/L   Glucose, Bld 239 (H) 65 - 99 mg/dL   BUN 42 (H) 6 - 20 mg/dL   Creatinine, Ser 2.36 (H) 0.61 - 1.24 mg/dL   Calcium 7.8 (L) 8.9 - 10.3 mg/dL   GFR calc non Af Amer 29 (L) >60 mL/min   GFR calc Af Amer 33 (L) >60 mL/min   Anion gap 7 5 - 15  Glucose, capillary     Status: Abnormal   Collection Time: 04/14/17  7:35 AM  Result Value Ref Range   Glucose-Capillary 205 (H) 65 - 99 mg/dL  Glucose, capillary     Status: Abnormal   Collection Time: 04/14/17 11:22 AM  Result Value Ref Range   Glucose-Capillary 226 (H) 65 - 99 mg/dL  Glucose, capillary     Status: Abnormal   Collection Time: 04/14/17  5:05 PM  Result Value Ref Range   Glucose-Capillary 369 (H) 65 - 99 mg/dL   Recent Results (from the past 240 hour(s))  Culture, blood (routine x 2)     Status: None (Preliminary result)   Collection Time: 04/13/17  6:17 PM  Result Value Ref Range Status   Specimen Description BLOOD LEFT HAND  Final   Special Requests   Final    BOTTLES DRAWN AEROBIC AND ANAEROBIC Blood Culture adequate volume   Culture NO GROWTH < 12 HOURS  Final   Report Status PENDING  Incomplete  Culture, blood (routine x 2)     Status: None (Preliminary result)   Collection Time: 04/13/17  6:17 PM  Result Value Ref Range  Status   Specimen Description LEFT ANTECUBITAL  Final   Special Requests   Final    BOTTLES DRAWN AEROBIC AND ANAEROBIC Blood Culture adequate volume   Culture NO  GROWTH < 12 HOURS  Final   Report Status PENDING  Incomplete    Renal Function: Recent Labs    04/13/17 1143 04/14/17 0530  CREATININE 1.91* 2.36*   Estimated Creatinine Clearance: 57.6 mL/min (A) (by C-G formula based on SCr of 2.36 mg/dL (H)).  Radiologic Imaging: US Scrotum  Result Date: 04/13/2017 CLINICAL DATA:  Pain and swelling for several days left greater than right EXAM: SCROTAL ULTRASOUND DOPPLER ULTRASOUND OF THE TESTICLES TECHNIQUE: Complete ultrasound examination of the testicles, epididymis, and other scrotal structures was performed. Color and spectral Doppler ultrasound were also utilized to evaluate blood flow to the testicles. COMPARISON:  None. FINDINGS: Right testicle Measurements: 3.5 x 2.3 x 2.5 cm. No mass or microlithiasis visualized. Left testicle Measurements: 3.5 x 2.4 x 2.4 cm. No mass or microlithiasis visualized. Right epididymis:  Normal in size and appearance. Left epididymis:  Prominent with slight increased vascularity. Hydrocele:  Small hydroceles noted bilaterally. Varicocele:  None visualized. Pulsed Doppler interrogation of both testes demonstrates normal low resistance arterial and venous waveforms bilaterally. Note is made of scrotal thickening bilaterally. IMPRESSION: Prominent epididymis on the left with slight increased vascularity. This may represent a component of epididymitis. Normal-appearing testicles bilaterally. Small hydroceles bilaterally with scrotal skin thickening. Electronically Signed   By: Inez Catalina M.D.   On: 04/13/2017 12:37   Ct Abdomen Pelvis W Contrast  Addendum Date: 04/14/2017   ADDENDUM REPORT: 04/14/2017 15:53 ADDENDUM: These results were called by telephone at the time of interpretation on 04/14/2017 at 3:35 pm to Dr. Dessa Phi , who verbally acknowledged  these results. Electronically Signed   By: Julian Hy M.D.   On: 04/14/2017 15:53   Result Date: 04/14/2017 CLINICAL DATA:  Severe scrotal swelling, scrotal pain, sepsis, epididymitis, diabetic, evaluate for Fournier's gangrene EXAM: CT ABDOMEN AND PELVIS WITH CONTRAST TECHNIQUE: Multidetector CT imaging of the abdomen and pelvis was performed using the standard protocol following bolus administration of intravenous contrast. CONTRAST:  153m ISOVUE-300 IOPAMIDOL (ISOVUE-300) INJECTION 61% COMPARISON:  None. FINDINGS: Motion degraded images. Lower chest: Eventration of the left hemidiaphragm. Hepatobiliary: Geographic hepatic steatosis. Layering 4 mm gallstone (series 2/ image 29), without associated inflammatory changes. No intrahepatic or extrahepatic ductal dilatation. Pancreas: Within normal limits. Spleen: Within normal limits. Adrenals/Urinary Tract: Adrenal glands are within normal limits. Bilateral lower pole nonobstructing renal calculi measuring 3 mm (series 2/ images 4445). Mildly heterogeneous enhancement on the right. No hydronephrosis. Bladder is within normal limits. Stomach/Bowel: Stomach is within normal limits. No evidence of bowel obstruction. Normal appendix (series 2/image 71). Vascular/Lymphatic: No evidence of abdominal aortic aneurysm. Atherosclerotic calcifications of the abdominal aorta and branch vessels. No suspicious abdominopelvic lymphadenopathy. Small right inguinal nodes measuring up to 15 mm on the right (series 2/ image 98), likely reactive. Reproductive: Prostate is grossly unremarkable. Scrotal wall thickening/ edema with associated subcutaneous gas on the right (series 3/image 16). Other: No abdominopelvic ascites. Tiny fat containing periumbilical hernia. Subcutaneous injection sites along the anterior abdominal wall. Musculoskeletal: Degenerative changes of the visualized thoracolumbar spine. IMPRESSION: Scrotal wall thickening/edema with associated subcutaneous gas  on the right. This appearance suggests infection with a gas-forming organism. Fournier's gangrene is the diagnosis of exclusion. Electronically Signed: By: SJulian HyM.D. On: 04/14/2017 15:33   [place]elvic Doppler (torsion R/o Or Mass Arterial Flow)  Result Date: 04/13/2017 CLINICAL DATA:  Pain and swelling for several days left greater than right EXAM: SCROTAL ULTRASOUND DOPPLER ULTRASOUND OF THE TESTICLES TECHNIQUE: Complete ultrasound examination of the testicles,  epididymis, and other scrotal structures was performed. Color and spectral Doppler ultrasound were also utilized to evaluate blood flow to the testicles. COMPARISON:  None. FINDINGS: Right testicle Measurements: 3.5 x 2.3 x 2.5 cm. No mass or microlithiasis visualized. Left testicle Measurements: 3.5 x 2.4 x 2.4 cm. No mass or microlithiasis visualized. Right epididymis:  Normal in size and appearance. Left epididymis:  Prominent with slight increased vascularity. Hydrocele:  Small hydroceles noted bilaterally. Varicocele:  None visualized. Pulsed Doppler interrogation of both testes demonstrates normal low resistance arterial and venous waveforms bilaterally. Note is made of scrotal thickening bilaterally. IMPRESSION: Prominent epididymis on the left with slight increased vascularity. This may represent a component of epididymitis. Normal-appearing testicles bilaterally. Small hydroceles bilaterally with scrotal skin thickening. Electronically Signed   By: Inez Catalina M.D.   On: 04/13/2017 12:37    I independently reviewed the above imaging studies.  Assessment and Plan [name] is a 57 y.o. male with epididymitis and free air within the scrotum, concerning for Fourniers gangrene. -Poorly controlled Type II DM -Obesity -Multiple Myeloma -CKD -PAF (on Xarelto)  The risks, benefits and alternatives of scrotal exploration with incision and debridement, including but not limited to the need for multiple surgeries,  testicular loss, long term wound care, persistent bleeding, worsening of the infection and inherent risks with general anesthesia.  He voices understanding and wishes to proceed.     Ellison Hughs, MD 04/14/2017, 7:51 PM  [facility] Pager: [phone]

## 2017-04-14 NOTE — Plan of Care (Signed)
  Acute Rehab PT Goals(only PT should resolve) Pt Will Go Supine/Side To Sit 04/14/2017 1418 - Progressing by Lonell Grandchild, PT Flowsheets Taken 04/14/2017 1418  Pt will go Supine/Side to Sit with minimal assist Pt Will Go Sit To Supine/Side 04/14/2017 1418 - Progressing by Lonell Grandchild, PT Flowsheets Taken 04/14/2017 1418  Pt will go Sit to Supine/Side with minimal assist Patient Will Transfer Sit To/From Stand 04/14/2017 1418 - Progressing by Lonell Grandchild, PT Flowsheets Taken 04/14/2017 1418  Patient will transfer sit to/from stand with minimal assist Pt Will Transfer Bed To Chair/Chair To Bed 04/14/2017 1418 - Progressing by Lonell Grandchild, PT Flowsheets Taken 04/14/2017 1418  Pt will Transfer Bed to Chair/Chair to Bed min guard assist Pt Will Ambulate 04/14/2017 1418 - Progressing by Lonell Grandchild, PT Flowsheets Taken 04/14/2017 1418  Pt will Ambulate 15 feet;with rolling walker;with cane;with supervision Pt Will Go Up/Down Stairs 04/14/2017 1418 - Progressing by Lonell Grandchild, PT Flowsheets Taken 04/14/2017 1418  Pt will Go Up / Down Stairs 6-9 stairs;with minimal assist;with rail(s);with cane  2:20 PM, 04/14/17 Lonell Grandchild, MPT Physical Therapist with [facility] [phone] office [phone] mobile phone]

## 2017-04-14 NOTE — Progress Notes (Signed)
Scrotal edema and excoriation with Weeping and odor. Foam dressing Applied at request of wife.  Sacral Dressing applied as well. Patient Assisted to chair by pt and later Wife assisted patient to toilet and Stood for bath with assist.  Drank Contrast for CT and knows not To eat lunch until returning from CT Has had percocet twice today For pain.  Dressings present on admission to both feet dry and intact Wife at bedside

## 2017-04-14 NOTE — Anesthesia Procedure Notes (Signed)
Procedure Name: Intubation Date/Time: 04/14/2017 8:22 PM Performed by: Cathleen Yagi D, CRNA Pre-anesthesia Checklist: Patient identified, Emergency Drugs available, Suction available and Patient being monitored Patient Re-evaluated:Patient Re-evaluated prior to induction Oxygen Delivery Method: Circle system utilized Preoxygenation: Pre-oxygenation with 100% oxygen Induction Type: IV induction, Rapid sequence and Cricoid Pressure applied Laryngoscope Size: Mac and 4 Tube type: Oral Tube size: 7.5 mm Number of attempts: 1 Airway Equipment and Method: Stylet and Oral airway Placement Confirmation: ETT inserted through vocal cords under direct vision,  positive ETCO2 and breath sounds checked- equal and bilateral Secured at: 22 cm Tube secured with: Tape Dental Injury: Teeth and Oropharynx as per pre-operative assessment

## 2017-04-14 NOTE — Anesthesia Postprocedure Evaluation (Signed)
Anesthesia Post Note  Patient: [name]  Procedure(s) Performed: INCISION AND DRAINAGE ABSCESS (N/A Scrotum)     Patient location during evaluation: PACU Anesthesia Type: General Level of consciousness: awake and alert Pain management: pain level controlled Vital Signs Assessment: post-procedure vital signs reviewed and stable Respiratory status: spontaneous breathing, nonlabored ventilation, respiratory function stable and patient connected to nasal cannula oxygen Cardiovascular status: blood pressure returned to baseline and stable Postop Assessment: no apparent nausea or vomiting Anesthetic complications: no    Last Vitals:  Vitals:   04/14/17 2150 04/14/17 2200  BP: 112/78 93/76  Pulse: 94 90  Resp: (!) 25 18  Temp: 37.6 C   SpO2: 100% 100%               Effie Berkshire

## 2017-04-14 NOTE — Progress Notes (Signed)
Patient assisted to bedside commode Earlier this afternoon by wife and Two nurses.  Able to bear weight And stand a pivot taking very small Steps.

## 2017-04-14 NOTE — Progress Notes (Signed)
Report called to [place] at [facility] step down room 1224.  l

## 2017-04-14 NOTE — Consult Note (Signed)
Urology Consult  Requesting physician: Dessa Phi, DO  Chief Complaint: Scrotal pain  History of Present Illness: [name] is a 56 y.o. male with multiple medical co-morbidities presents with CT and ultrasound findings concerning for Fournier's gangrene.  The patient states that he recently had foot surgery and was unable to ambulate well post-operatively.  While trying to slide out of bed 3 days ago, the patient states that he may have lacerated his scrotum.  Since that time, he has developed worsening scrotal pain associated with general malaise.  He has been receiving broad spectrum antibiotics over the past 24 hours while at [facility] with little improvement in his clinical picture.      Past Medical History:  Diagnosis Date  . Anemia   . Arteriosclerotic cardiovascular disease (ASCVD)    MI-2000s; stent to the proximal LAD and diagonal in 2001; stress nuclear in 2008-impaired exercise capacity, left ventricular dilatation, moderately to severely depressed EF, apical, inferior and anteroseptal scar  . Arthritis   . Atrial flutter ([place])   . 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 Cellulitis of leg    both legs  . Chronic diarrhea   . Chronic kidney disease, stage 3, mod decreased GFR (HCC)    Creatinine of 1.84 in 06/2011 and 1.5 in 07/2011  . Diabetes mellitus    Insulin  . Dysrhythmia    AFlutter  . GERD (gastroesophageal reflux disease)   . Gout   . Hyperlipidemia   . Hypertension   . Injection site reaction   . Multiple myeloma 07/01/2011  . Myocardial infarction ([place]) 2000  . Obesity   . Pedal edema    Venous insufficiency  . Sleep apnea    uses cpap  . Ulcer     Past Surgical History:  Procedure Laterality Date  . ABSCESS DRAINAGE     Scrotal  . BONE MARROW BIOPSY  05/13/11  . CARDIAC CATHETERIZATION     cardiac stent  . INCISION AND DRAINAGE ABSCESS ANAL    . LAPAROSCOPIC GASTRIC BANDING  2006   has been removed  . WRIST SURGERY     Left; removal  of bone fragment    Allergies: No Known Allergies  Family History  Problem Relation Age of Onset  . Heart disease Mother   . Cancer Mother   . Diabetes Father   . Arthritis Unknown   . Anesthesia problems Neg Hx   . Hypotension Neg Hx   . Malignant hyperthermia Neg Hx   . Pseudochol deficiency Neg Hx     Social History:  reports that he quit smoking about 15 years ago. His smoking use included cigarettes and cigars. He has a 0.25 pack-year smoking history. he has never used smokeless tobacco. He reports that he does not drink alcohol or use drugs.  ROS: A complete review of systems was performed.  All systems are negative except for pertinent findings as noted.  Physical Exam:  Vital signs in last 24 hours: Temp:  [98.2 F (36.8 C)-101.1 F (38.4 C)] 101.1 F (38.4 C) (11/13 1906) Pulse Rate:  [87-98] 92 (11/13 1903) Resp:  [16-21] 21 (11/13 1903) BP: (111-168)/(67-147) 168/147 (11/13 1903) SpO2:  [91 %-99 %] 95 % (11/13 1903) Constitutional:  Alert and oriented, No acute distress Cardiovascular: Regular rate and rhythm, No JVD Respiratory: Normal respiratory effort, Lungs clear bilaterally GI: Abdomen is soft, nontender, nondistended, no abdominal masses GU: The scrotum is diffusely tender to palpation, endurated and erythematous.  There is crepitus along the  left hemiscrotum with no signs of skin necrosis. Lymphatic: No lymphadenopathy Neurologic: Grossly intact, no focal deficits Psychiatric: Normal mood and affect  Laboratory Data:  Recent Labs    04/13/17 1143 04/14/17 0530  WBC 16.6* 16.4*  HGB 9.0* 8.5*  HCT 28.9* 26.8*  PLT 157 127*    Recent Labs    04/13/17 1143 04/14/17 0530  NA 135 134*  K 4.0 4.7  CL 103 103  GLUCOSE 229* 239*  BUN 41* 42*  CALCIUM 7.8* 7.8*  CREATININE 1.91* 2.36*     Results for orders placed or performed during the hospital encounter of 04/13/17 (from the past 24 hour(s))  Glucose, capillary     Status: Abnormal    Collection Time: 04/13/17  9:20 PM  Result Value Ref Range   Glucose-Capillary 232 (H) 65 - 99 mg/dL  CBC     Status: Abnormal   Collection Time: 04/14/17  5:30 AM  Result Value Ref Range   WBC 16.4 (H) 4.0 - 10.5 K/uL   RBC 2.83 (L) 4.22 - 5.81 MIL/uL   Hemoglobin 8.5 (L) 13.0 - 17.0 g/dL   HCT 26.8 (L) 39.0 - 52.0 %   MCV 94.7 78.0 - 100.0 fL   MCH 30.0 26.0 - 34.0 pg   MCHC 31.7 30.0 - 36.0 g/dL   RDW 17.4 (H) 11.5 - 15.5 %   Platelets 127 (L) 150 - 400 K/uL  Basic metabolic panel     Status: Abnormal   Collection Time: 04/14/17  5:30 AM  Result Value Ref Range   Sodium 134 (L) 135 - 145 mmol/L   Potassium 4.7 3.5 - 5.1 mmol/L   Chloride 103 101 - 111 mmol/L   CO2 24 22 - 32 mmol/L   Glucose, Bld 239 (H) 65 - 99 mg/dL   BUN 42 (H) 6 - 20 mg/dL   Creatinine, Ser 2.36 (H) 0.61 - 1.24 mg/dL   Calcium 7.8 (L) 8.9 - 10.3 mg/dL   GFR calc non Af Amer 29 (L) >60 mL/min   GFR calc Af Amer 33 (L) >60 mL/min   Anion gap 7 5 - 15  Glucose, capillary     Status: Abnormal   Collection Time: 04/14/17  7:35 AM  Result Value Ref Range   Glucose-Capillary 205 (H) 65 - 99 mg/dL  Glucose, capillary     Status: Abnormal   Collection Time: 04/14/17 11:22 AM  Result Value Ref Range   Glucose-Capillary 226 (H) 65 - 99 mg/dL  Glucose, capillary     Status: Abnormal   Collection Time: 04/14/17  5:05 PM  Result Value Ref Range   Glucose-Capillary 369 (H) 65 - 99 mg/dL   Recent Results (from the past 240 hour(s))  Culture, blood (routine x 2)     Status: None (Preliminary result)   Collection Time: 04/13/17  6:17 PM  Result Value Ref Range Status   Specimen Description BLOOD LEFT HAND  Final   Special Requests   Final    BOTTLES DRAWN AEROBIC AND ANAEROBIC Blood Culture adequate volume   Culture NO GROWTH < 12 HOURS  Final   Report Status PENDING  Incomplete  Culture, blood (routine x 2)     Status: None (Preliminary result)   Collection Time: 04/13/17  6:17 PM  Result Value Ref Range  Status   Specimen Description LEFT ANTECUBITAL  Final   Special Requests   Final    BOTTLES DRAWN AEROBIC AND ANAEROBIC Blood Culture adequate volume   Culture NO  GROWTH < 12 HOURS  Final   Report Status PENDING  Incomplete    Renal Function: Recent Labs    04/13/17 1143 04/14/17 0530  CREATININE 1.91* 2.36*   Estimated Creatinine Clearance: 57.6 mL/min (A) (by C-G formula based on SCr of 2.36 mg/dL (H)).  Radiologic Imaging: US Scrotum  Result Date: 04/13/2017 CLINICAL DATA:  Pain and swelling for several days left greater than right EXAM: SCROTAL ULTRASOUND DOPPLER ULTRASOUND OF THE TESTICLES TECHNIQUE: Complete ultrasound examination of the testicles, epididymis, and other scrotal structures was performed. Color and spectral Doppler ultrasound were also utilized to evaluate blood flow to the testicles. COMPARISON:  None. FINDINGS: Right testicle Measurements: 3.5 x 2.3 x 2.5 cm. No mass or microlithiasis visualized. Left testicle Measurements: 3.5 x 2.4 x 2.4 cm. No mass or microlithiasis visualized. Right epididymis:  Normal in size and appearance. Left epididymis:  Prominent with slight increased vascularity. Hydrocele:  Small hydroceles noted bilaterally. Varicocele:  None visualized. Pulsed Doppler interrogation of both testes demonstrates normal low resistance arterial and venous waveforms bilaterally. Note is made of scrotal thickening bilaterally. IMPRESSION: Prominent epididymis on the left with slight increased vascularity. This may represent a component of epididymitis. Normal-appearing testicles bilaterally. Small hydroceles bilaterally with scrotal skin thickening. Electronically Signed   By: Inez Catalina M.D.   On: 04/13/2017 12:37   Ct Abdomen Pelvis W Contrast  Addendum Date: 04/14/2017   ADDENDUM REPORT: 04/14/2017 15:53 ADDENDUM: These results were called by telephone at the time of interpretation on 04/14/2017 at 3:35 pm to Dr. Dessa Phi , who verbally acknowledged  these results. Electronically Signed   By: Julian Hy M.D.   On: 04/14/2017 15:53   Result Date: 04/14/2017 CLINICAL DATA:  Severe scrotal swelling, scrotal pain, sepsis, epididymitis, diabetic, evaluate for Fournier's gangrene EXAM: CT ABDOMEN AND PELVIS WITH CONTRAST TECHNIQUE: Multidetector CT imaging of the abdomen and pelvis was performed using the standard protocol following bolus administration of intravenous contrast. CONTRAST:  153m ISOVUE-300 IOPAMIDOL (ISOVUE-300) INJECTION 61% COMPARISON:  None. FINDINGS: Motion degraded images. Lower chest: Eventration of the left hemidiaphragm. Hepatobiliary: Geographic hepatic steatosis. Layering 4 mm gallstone (series 2/ image 29), without associated inflammatory changes. No intrahepatic or extrahepatic ductal dilatation. Pancreas: Within normal limits. Spleen: Within normal limits. Adrenals/Urinary Tract: Adrenal glands are within normal limits. Bilateral lower pole nonobstructing renal calculi measuring 3 mm (series 2/ images 4445). Mildly heterogeneous enhancement on the right. No hydronephrosis. Bladder is within normal limits. Stomach/Bowel: Stomach is within normal limits. No evidence of bowel obstruction. Normal appendix (series 2/image 71). Vascular/Lymphatic: No evidence of abdominal aortic aneurysm. Atherosclerotic calcifications of the abdominal aorta and branch vessels. No suspicious abdominopelvic lymphadenopathy. Small right inguinal nodes measuring up to 15 mm on the right (series 2/ image 98), likely reactive. Reproductive: Prostate is grossly unremarkable. Scrotal wall thickening/ edema with associated subcutaneous gas on the right (series 3/image 16). Other: No abdominopelvic ascites. Tiny fat containing periumbilical hernia. Subcutaneous injection sites along the anterior abdominal wall. Musculoskeletal: Degenerative changes of the visualized thoracolumbar spine. IMPRESSION: Scrotal wall thickening/edema with associated subcutaneous gas  on the right. This appearance suggests infection with a gas-forming organism. Fournier's gangrene is the diagnosis of exclusion. Electronically Signed: By: SJulian HyM.D. On: 04/14/2017 15:33   [place]elvic Doppler (torsion R/o Or Mass Arterial Flow)  Result Date: 04/13/2017 CLINICAL DATA:  Pain and swelling for several days left greater than right EXAM: SCROTAL ULTRASOUND DOPPLER ULTRASOUND OF THE TESTICLES TECHNIQUE: Complete ultrasound examination of the testicles,  epididymis, and other scrotal structures was performed. Color and spectral Doppler ultrasound were also utilized to evaluate blood flow to the testicles. COMPARISON:  None. FINDINGS: Right testicle Measurements: 3.5 x 2.3 x 2.5 cm. No mass or microlithiasis visualized. Left testicle Measurements: 3.5 x 2.4 x 2.4 cm. No mass or microlithiasis visualized. Right epididymis:  Normal in size and appearance. Left epididymis:  Prominent with slight increased vascularity. Hydrocele:  Small hydroceles noted bilaterally. Varicocele:  None visualized. Pulsed Doppler interrogation of both testes demonstrates normal low resistance arterial and venous waveforms bilaterally. Note is made of scrotal thickening bilaterally. IMPRESSION: Prominent epididymis on the left with slight increased vascularity. This may represent a component of epididymitis. Normal-appearing testicles bilaterally. Small hydroceles bilaterally with scrotal skin thickening. Electronically Signed   By: Inez Catalina M.D.   On: 04/13/2017 12:37    I independently reviewed the above imaging studies.  Assessment and Plan CARLETON VANVALKENBURGH is a 57 y.o. male with epididymitis and free air within the scrotum, concerning for Fourniers gangrene. -Poorly controlled Type II DM -Obesity -Multiple Myeloma -CKD -PAF (on Xarelto)  The risks, benefits and alternatives of scrotal exploration with incision and debridement, including but not limited to the need for multiple surgeries,  testicular loss, long term wound care, persistent bleeding, worsening of the infection and inherent risks with general anesthesia.  He voices understanding and wishes to proceed.     Ellison Hughs, MD 04/14/2017, 7:51 PM  [facility] Pager: [phone]

## 2017-04-14 NOTE — Progress Notes (Signed)
Pharmacy Antibiotic Note  [name] is a 56 y.o. male admitted on 04/13/2017 with cellulitis.  Pharmacy has been consulted for vancomycin dosing.  Plan: Vancomycin 2gm loading dose then 1750 mg IV q24 hours Zosyn per MD F/u renal function, cultures and clinical course  Height: 6' 2.5" (189.2 cm) Weight: (!) 382 lb 3.2 oz (173.4 kg) IBW/kg (Calculated) : 83.35  Temp (24hrs), Avg:100.2 F (37.9 C), Min:98.2 F (36.8 C), Max:102.8 F (39.3 C)  Recent Labs  Lab 04/13/17 1143 04/14/17 0530  WBC 16.6* 16.4*  CREATININE 1.91* 2.36*    Normalized CrCl = 35mls/min Estimated Creatinine Clearance: 57.6 mL/min (A) (by C-G formula based on SCr of 2.36 mg/dL (H)).    No Known Allergies  Antimicrobials this admission: Cefepime 11/12>> 11/13 Levaquin 11/12 >> 11/12 vanc  11/13 Zosyn  11/13  Dose adjustments this admission: N/A  Microbiology results: 11/12 BCx: ngts 11/12 UCx: pending   Thank you for allowing pharmacy to be a part of this patient's care.  Bartholomew Crews D Clinical Pharmacist 04/14/2017 1:14 PM

## 2017-04-14 NOTE — Progress Notes (Signed)
Dr. Maylene Roes assessed patient this morning and ordered a CT of abd and pelvis As well as PT.

## 2017-04-14 NOTE — Transfer of Care (Signed)
Immediate Anesthesia Transfer of Care Note  Patient: [name]  Procedure(s) Performed: INCISION AND DRAINAGE ABSCESS (N/A Scrotum)  Patient Location: PACU  Anesthesia Type:General  Level of Consciousness: awake, alert  and oriented  Airway & Oxygen Therapy: Patient Spontanous Breathing and Patient connected to face mask oxygen  Post-op Assessment: Report given to RN and Post -op Vital signs reviewed and stable  Post vital signs: Reviewed and stable  Last Vitals:  Vitals:   04/14/17 1903 04/14/17 1906  BP: (!) 168/147   Pulse: 92   Resp: (!) 21   Temp:  (!) 38.4 C  SpO2: 95%     Last Pain:  Vitals:   04/14/17 1906  TempSrc: Oral  PainSc:       Patients Stated Pain Goal: 3 (22/56/72 0919)  Complications: No apparent anesthesia complications

## 2017-04-14 NOTE — Progress Notes (Signed)
Dr. Maylene Roes called and said patient to be transferred to [facility] step down to have surgery for Fourniers gangrene.  To be NPO until surgery and will receive no eliquis today. Patient aware not to eat or drink and Dr.Choi has talked to him personally as well as his wife. Port saline locked per Dr. Maylene Roes, saying he does not need to go with fluids  Transport here for patient now

## 2017-04-14 NOTE — Progress Notes (Addendum)
PROGRESS NOTE    [name]  DZH:[id number] DOB: Mar 20, 1961 DOA: 04/13/2017 PCP: [name], MD     Brief Narrative:  [name] is a 56 y.o. male with PMH of PAF on (xarelto), HTN, DM, CKD, Multiple myeloma, previous h/o epidermitis who presented with scrotal pain. Patient states that he was not able to ambulate well due to his recent toe surgery. He was sliding down from his bed and thinks he had some laceration in the posterior wall of his scrotum. He developed swelling and and pain at the scrotum for 3 days. Pain is worse with putting some weight or moving across the bed. He reports previous similar symptoms and diagnosed with epididymitis, treated with antibiotics. He denies fever, chills, chest pains, shortness of breath. In the ED, scrotal ultrasound showed prominent epididymis on the left with slight increased vascularity. This may represent a component of epididymitis. EDP discussed with urology Dr. Pilar Jarvis who agreed for IV antibiotic treatment.  Assessment & Plan:   Active Problems:   Hypertension   Atrial flutter (HCC)   Anemia, normocytic normochromic   Epididymitis   Acute epididymitis   Sepsis secondary to scrotal cellulitis with epididymitis -Check GC/Chlamydia. HIV NR  -Blood culture pending  -Check CT abd/pelvis rule out deeper infection, rule out Fournier's due to high fever 102.8, continued pain and elevated WBC as well as immunosuppression status and uncontrolled DM  -Broaden antibiotics to vanco/zosyn   Addendum:  -CT abd/pelvis revealed scrotal wall thickening/edema with subcutaneous gas on the right, suggestive of infection with gas-forming organism. Case discussed with Dr. Lovena Neighbours (urology) who requested for urgent transfer to [facility] for surgical exploration. -Updated patient, wife, RN, flow manager for urgent transfer to stepdown bed at [facility]. NPO. Patient currently hemodynamically stable.  -Continue  vanco/zosyn. Add clindamycin for antitoxin effects   DM type 2 with hyperglycemia  -Ha1c 8.2  -Continue levemir, SSI  AKI on CKD stage 3 -Baseline Cr 1.7-1.9 -Continue IVF  HTN -Continue metoprolol   CAD -Continue aspirin, lipitor   Multiple myeloma -Continue decadron, eluxadoline, revlimid, bactrim, acyclovir  Paroxysmal A Fib -Continue xarelto    DVT prophylaxis: Xarelto  Code Status: Full Family Communication: No family at bedside  Disposition Plan: Pending improvement   Consultants:   Urology   Procedures:   None   Antimicrobials:  Anti-infectives (From admission, onward)   Start     Dose/Rate Route Frequency Ordered Stop   04/15/17 0900  sulfamethoxazole-trimethoprim (BACTRIM DS,SEPTRA DS) 800-160 MG per tablet 1 tablet     1 tablet Oral Once per day on Mon Wed Fri 04/13/17 1412     04/14/17 1400  piperacillin-tazobactam (ZOSYN) IVPB 3.375 g     3.375 g 12.5 mL/hr over 240 Minutes Intravenous Every 8 hours 04/14/17 1309     04/14/17 1000  acyclovir (ZOVIRAX) tablet 400 mg     400 mg Oral  Every morning - 10a 04/13/17 1412     04/13/17 2000  ceFEPIme (MAXIPIME) 2 g in dextrose 5 % 50 mL IVPB  Status:  Discontinued     2 g 100 mL/hr over 30 Minutes Intravenous Every 24 hours 04/13/17 1716 04/14/17 1309   04/13/17 1800  doxycycline (VIBRA-TABS) tablet 100 mg  Status:  Discontinued     100 mg Oral Every 12 hours 04/13/17 1648 04/14/17 1309   04/13/17 1300  levofloxacin (LEVAQUIN) IVPB 500 mg     500 mg 100 mL/hr over 60 Minutes Intravenous  Once 04/13/17 1255 04/13/17 1507       Subjective: Patient continues to have scrotal swelling and pain. It has not improved.  He states that laying in bed has made increased pressure in the area and requesting to sit up. He has no other complaints.  Denies any chest pain or shortness of breath, no nausea or vomiting.  He is sitting in bed eating breakfast.  Objective: Vitals:   04/13/17 2118 04/13/17 2310 04/14/17  0615 04/14/17 0830  BP:  138/89 (!) 132/98 111/67  Pulse:  98 87 89  Resp:  _0 Temp:  98.9 F (37.2 C) 98.2 F (36.8 C) 99.1 F (37.3 C)  TempSrc:  Oral Oral Oral  SpO2: 91% 92% 98% 99%  Weight:      Height:        Intake/Output Summary (Last 24 hours) at 04/14/2017 1312 Last data filed at 04/14/2017 0950 Gross per 24 hour  Intake 750 ml  Output 1300 ml  Net -550 ml   Filed Weights   04/13/17 1018 04/13/17 1613  Weight: (!) 167.8 kg (370 lb) (!) 173.4 kg (382 lb 3.2 oz)    Examination:  General exam: Appears calm and comfortable  Respiratory system: Clear to auscultation. Respiratory effort normal. Cardiovascular system: S1 & S2 heard, RRR. No JVD, murmurs, rubs, gallops or clicks. No pedal edema. Gastrointestinal system: Abdomen is nondistended, soft and nontender. No organomegaly or masses felt. Normal bowel sounds heard.  GU: +diffuse scrotal swelling with pain with minimal palpation, groin without crepitus or erythema, there is some serous drainage on the bed sheet without pus  Central nervous system: Alert and oriented. No focal neurological deficits. Extremities: Symmetric, +left foot in cast s/p surgery  Psychiatry: Judgement and insight appear normal. Mood & affect appropriate.   Data Reviewed: I have personally reviewed following labs and imaging studies  CBC: Recent Labs  Lab 04/13/17 1143 04/14/17 0530  WBC 16.6* 16.4*  NEUTROABS 14.7*  --   HGB 9.0* 8.5*  HCT 28.9* 26.8*  MCV 95.1 94.7  PLT 157 625*   Basic Metabolic Panel: Recent Labs  Lab 04/13/17 1143 04/14/17 0530  NA 135 134*  K 4.0 4.7  CL 103 103  CO2 23 24  GLUCOSE 229* 239*  BUN 41* 42*  CREATININE 1.91* 2.36*  CALCIUM 7.8* 7.8*   GFR: Estimated Creatinine Clearance: 57.6 mL/min (A) (by C-G formula based on SCr of 2.36 mg/dL (H)). Liver Function Tests: No results for input(s): AST, ALT, ALKPHOS, BILITOT, PROT, ALBUMIN in the last 168 hours. No results for input(s):  LIPASE, AMYLASE in the last 168 hours. No results for input(s): AMMONIA in the last 168 hours. Coagulation Profile: No results for input(s): INR, PROTIME in the last 168 hours. Cardiac Enzymes: No results for input(s): CKTOTAL, CKMB, CKMBINDEX, TROPONINI in the last 168 hours. BNP (last 3 results) No results for input(s): PROBNP in the last 8760 hours. HbA1C: No results for input(s): HGBA1C in the last 72 hours. CBG: Recent Labs  Lab 04/08/17 0838 04/13/17 1714 04/13/17 2120 04/14/17 0735 04/14/17 1122  GLUCAP 167* 216* 232* 205* 226*   Lipid Profile: No results for input(s): CHOL, HDL, LDLCALC, TRIG, CHOLHDL, LDLDIRECT in the last 72 hours. Thyroid Function Tests: No results for input(s): TSH, T4TOTAL, FREET4, T3FREE, THYROIDAB in the last 72 hours. Anemia Panel: No results for input(s): VITAMINB12, FOLATE, FERRITIN, TIBC, IRON, RETICCTPCT in the last 72 hours. Sepsis Labs: No results for input(s): PROCALCITON, LATICACIDVEN in the last  168 hours.  Recent Results (from the past 240 hour(s))  Culture, blood (routine x 2)     Status: None (Preliminary result)   Collection Time: 04/13/17  6:17 PM  Result Value Ref Range Status   Specimen Description BLOOD LEFT HAND  Final   Special Requests   Final    BOTTLES DRAWN AEROBIC AND ANAEROBIC Blood Culture adequate volume   Culture NO GROWTH < 12 HOURS  Final   Report Status PENDING  Incomplete  Culture, blood (routine x 2)     Status: None (Preliminary result)   Collection Time: 04/13/17  6:17 PM  Result Value Ref Range Status   Specimen Description LEFT ANTECUBITAL  Final   Special Requests   Final    BOTTLES DRAWN AEROBIC AND ANAEROBIC Blood Culture adequate volume   Culture NO GROWTH < 12 HOURS  Final   Report Status PENDING  Incomplete       Radiology Studies: US Scrotum  Result Date: 04/13/2017 CLINICAL DATA:  Pain and swelling for several days left greater than right EXAM: SCROTAL ULTRASOUND DOPPLER ULTRASOUND OF  THE TESTICLES TECHNIQUE: Complete ultrasound examination of the testicles, epididymis, and other scrotal structures was performed. Color and spectral Doppler ultrasound were also utilized to evaluate blood flow to the testicles. COMPARISON:  None. FINDINGS: Right testicle Measurements: 3.5 x 2.3 x 2.5 cm. No mass or microlithiasis visualized. Left testicle Measurements: 3.5 x 2.4 x 2.4 cm. No mass or microlithiasis visualized. Right epididymis:  Normal in size and appearance. Left epididymis:  Prominent with slight increased vascularity. Hydrocele:  Small hydroceles noted bilaterally. Varicocele:  None visualized. Pulsed Doppler interrogation of both testes demonstrates normal low resistance arterial and venous waveforms bilaterally. Note is made of scrotal thickening bilaterally. IMPRESSION: Prominent epididymis on the left with slight increased vascularity. This may represent a component of epididymitis. Normal-appearing testicles bilaterally. Small hydroceles bilaterally with scrotal skin thickening. Electronically Signed   By: Inez Catalina M.D.   On: 04/13/2017 12:37   US Pelvic Doppler (torsion R/o Or Mass Arterial Flow)  Result Date: 04/13/2017 CLINICAL DATA:  Pain and swelling for several days left greater than right EXAM: SCROTAL ULTRASOUND DOPPLER ULTRASOUND OF THE TESTICLES TECHNIQUE: Complete ultrasound examination of the testicles, epididymis, and other scrotal structures was performed. Color and spectral Doppler ultrasound were also utilized to evaluate blood flow to the testicles. COMPARISON:  None. FINDINGS: Right testicle Measurements: 3.5 x 2.3 x 2.5 cm. No mass or microlithiasis visualized. Left testicle Measurements: 3.5 x 2.4 x 2.4 cm. No mass or microlithiasis visualized. Right epididymis:  Normal in size and appearance. Left epididymis:  Prominent with slight increased vascularity. Hydrocele:  Small hydroceles noted bilaterally. Varicocele:  None visualized. Pulsed Doppler interrogation of  both testes demonstrates normal low resistance arterial and venous waveforms bilaterally. Note is made of scrotal thickening bilaterally. IMPRESSION: Prominent epididymis on the left with slight increased vascularity. This may represent a component of epididymitis. Normal-appearing testicles bilaterally. Small hydroceles bilaterally with scrotal skin thickening. Electronically Signed   By: Inez Catalina M.D.   On: 04/13/2017 12:37      Scheduled Meds: . acyclovir  400 mg Oral q morning - 10a  . allopurinol  300 mg Oral Daily  . aspirin EC  81 mg Oral Daily  . atorvastatin  80 mg Oral QHS  . [START ON 04/15/2017] calcitRIOL  0.25 mcg Oral Once per day on Mon Wed Fri  . colchicine  0.6 mg Oral Daily  . [START ON 04/17/2017]  dexamethasone  10 mg Oral Q Fri-1800  . Eluxadoline  100 mg Oral Daily  . gabapentin  300 mg Oral QHS  . insulin aspart  0-9 Units Subcutaneous TID WC  . insulin detemir  40 Units Subcutaneous q morning - 10a  . [START ON 04/19/2017] lenalidomide  5 mg Oral Daily  . metoprolol succinate  25 mg Oral Daily  . multivitamin with minerals  1 tablet Oral Daily  . pantoprazole  40 mg Oral Daily  . rivaroxaban  20 mg Oral Q supper  . [START ON 04/15/2017] sulfamethoxazole-trimethoprim  1 tablet Oral Once per day on Mon Wed Fri  . torsemide  40 mg Oral Daily  . [START ON 04/20/2017] Vitamin D (Ergocalciferol)  50,000 Units Oral Q7 days   Continuous Infusions: . sodium chloride    . piperacillin-tazobactam (ZOSYN)  IV       LOS: 0 days    Time spent: 40 minutes   Dessa Phi, DO [facility] [email or web address] Password [email or web address] 04/14/2017, 1:12 PM

## 2017-04-15 ENCOUNTER — Encounter (HOSPITAL_COMMUNITY): Payer: Self-pay | Admitting: Urology

## 2017-04-15 ENCOUNTER — Other Ambulatory Visit: Payer: Self-pay | Admitting: Urology

## 2017-04-15 ENCOUNTER — Inpatient Hospital Stay (HOSPITAL_COMMUNITY): Payer: BLUE CROSS/BLUE SHIELD | Admitting: Anesthesiology

## 2017-04-15 ENCOUNTER — Encounter (HOSPITAL_COMMUNITY)
Admission: EM | Disposition: A | Discharge status: Skilled Nursing Facility | Payer: Self-pay | Source: Home / Self Care | Attending: Internal Medicine

## 2017-04-15 DIAGNOSIS — A419 Sepsis, unspecified organism: Principal | ICD-10-CM

## 2017-04-15 DIAGNOSIS — N493 Fournier gangrene: Secondary | ICD-10-CM

## 2017-04-15 DIAGNOSIS — E1165 Type 2 diabetes mellitus with hyperglycemia: Secondary | ICD-10-CM

## 2017-04-15 DIAGNOSIS — R6520 Severe sepsis without septic shock: Secondary | ICD-10-CM

## 2017-04-15 DIAGNOSIS — E118 Type 2 diabetes mellitus with unspecified complications: Secondary | ICD-10-CM

## 2017-04-15 DIAGNOSIS — I480 Paroxysmal atrial fibrillation: Secondary | ICD-10-CM

## 2017-04-15 DIAGNOSIS — C9002 Multiple myeloma in relapse: Secondary | ICD-10-CM

## 2017-04-15 DIAGNOSIS — I5022 Chronic systolic (congestive) heart failure: Secondary | ICD-10-CM

## 2017-04-15 HISTORY — PX: IRRIGATION AND DEBRIDEMENT ABSCESS: SHX5252

## 2017-04-15 LAB — CBC WITH DIFFERENTIAL/PLATELET
Basophils Absolute: 0 10*3/uL (ref 0.0–0.1)
Basophils Relative: 0 %
Eosinophils Absolute: 0.4 10*3/uL (ref 0.0–0.7)
Eosinophils Relative: 5 %
HCT: 22.4 % — ABNORMAL LOW (ref 39.0–52.0)
Hemoglobin: 7.4 g/dL — ABNORMAL LOW (ref 13.0–17.0)
Lymphocytes Relative: 14 %
Lymphs Abs: 1.4 10*3/uL (ref 0.7–4.0)
MCH: 30.1 pg (ref 26.0–34.0)
MCHC: 33 g/dL (ref 30.0–36.0)
MCV: 91.1 fL (ref 78.0–100.0)
Monocytes Absolute: 1.4 10*3/uL — ABNORMAL HIGH (ref 0.1–1.0)
Monocytes Relative: 14 %
Neutro Abs: 6.7 10*3/uL (ref 1.7–7.7)
Neutrophils Relative %: 67 %
Platelets: 132 10*3/uL — ABNORMAL LOW (ref 150–400)
RBC: 2.46 MIL/uL — ABNORMAL LOW (ref 4.22–5.81)
RDW: 17.5 % — ABNORMAL HIGH (ref 11.5–15.5)
WBC: 9.9 10*3/uL (ref 4.0–10.5)

## 2017-04-15 LAB — GLUCOSE, CAPILLARY
Glucose-Capillary: 167 mg/dL — ABNORMAL HIGH (ref 65–99)
Glucose-Capillary: 116 mg/dL — ABNORMAL HIGH (ref 65–99)
Glucose-Capillary: 102 mg/dL — ABNORMAL HIGH (ref 65–99)
Glucose-Capillary: 82 mg/dL (ref 65–99)
Glucose-Capillary: 82 mg/dL (ref 65–99)

## 2017-04-15 LAB — BASIC METABOLIC PANEL
Anion gap: 8 (ref 5–15)
BUN: 35 mg/dL — ABNORMAL HIGH (ref 6–20)
CO2: 23 mmol/L (ref 22–32)
Calcium: 7.6 mg/dL — ABNORMAL LOW (ref 8.9–10.3)
Chloride: 106 mmol/L (ref 101–111)
Creatinine, Ser: 2.36 mg/dL — ABNORMAL HIGH (ref 0.61–1.24)
GFR calc Af Amer: 33 mL/min — ABNORMAL LOW (ref >60)
GFR calc non Af Amer: 29 mL/min — ABNORMAL LOW (ref >60)
Glucose, Bld: 90 mg/dL (ref 65–99)
Potassium: 4.1 mmol/L (ref 3.5–5.1)
Sodium: 137 mmol/L (ref 135–145)

## 2017-04-15 SURGERY — IRRIGATION AND DEBRIDEMENT ABSCESS
Anesthesia: General

## 2017-04-15 MED ORDER — FENTANYL CITRATE (PF) 100 MCG/2ML IJ SOLN
INTRAMUSCULAR | Status: DC | PRN
  Administered 2017-04-15 (×2): 50 ug via INTRAVENOUS

## 2017-04-15 MED ORDER — HYDROMORPHONE HCL 1 MG/ML IJ SOLN
INTRAMUSCULAR | Status: AC
  Administered 2017-04-17: 0.5 mg via INTRAVENOUS
  Filled 2017-04-15: qty 1

## 2017-04-15 MED ORDER — OXYCODONE HCL 5 MG PO TABS
ORAL_TABLET | ORAL | Status: AC
  Filled 2017-04-15: qty 1

## 2017-04-15 MED ORDER — SODIUM CHLORIDE 0.9 % IR SOLN
Status: DC | PRN
  Administered 2017-04-15: 1000 mL

## 2017-04-15 MED ORDER — ROCURONIUM BROMIDE 10 MG/ML (PF) SYRINGE
PREFILLED_SYRINGE | INTRAVENOUS | Status: DC | PRN
  Administered 2017-04-15: 20 mg via INTRAVENOUS

## 2017-04-15 MED ORDER — FENTANYL CITRATE (PF) 100 MCG/2ML IJ SOLN
25.0000 ug | INTRAMUSCULAR | Status: DC | PRN
  Administered 2017-04-15 (×3): 50 ug via INTRAVENOUS

## 2017-04-15 MED ORDER — ACETAMINOPHEN 10 MG/ML IV SOLN
INTRAVENOUS | Status: AC
  Filled 2017-04-15: qty 100

## 2017-04-15 MED ORDER — PROPOFOL 10 MG/ML IV BOLUS
INTRAVENOUS | Status: AC
  Filled 2017-04-15: qty 20

## 2017-04-15 MED ORDER — OXYCODONE HCL 5 MG PO TABS
5.0000 mg | ORAL_TABLET | Freq: Once | ORAL | Status: AC
  Administered 2017-04-15: 5 mg via ORAL

## 2017-04-15 MED ORDER — SUCCINYLCHOLINE CHLORIDE 200 MG/10ML IV SOSY
PREFILLED_SYRINGE | INTRAVENOUS | Status: AC
  Filled 2017-04-15: qty 10

## 2017-04-15 MED ORDER — ONDANSETRON HCL 4 MG/2ML IJ SOLN
INTRAMUSCULAR | Status: AC
  Filled 2017-04-15: qty 2

## 2017-04-15 MED ORDER — FENTANYL CITRATE (PF) 100 MCG/2ML IJ SOLN
INTRAMUSCULAR | Status: AC
  Filled 2017-04-15: qty 2

## 2017-04-15 MED ORDER — PROMETHAZINE HCL 25 MG/ML IJ SOLN: 6.2500 mg | INTRAMUSCULAR | Status: DC | PRN

## 2017-04-15 MED ORDER — HYDROMORPHONE HCL 2 MG/ML IJ SOLN
2.0000 mg | Freq: Once | INTRAMUSCULAR | Status: AC
  Administered 2017-04-15: 2 mg via INTRAVENOUS
  Filled 2017-04-15: qty 1

## 2017-04-15 MED ORDER — ONDANSETRON HCL 4 MG/2ML IJ SOLN
INTRAMUSCULAR | Status: DC | PRN
Start: 2017-04-15 — End: 2017-04-15
  Administered 2017-04-15: 4 mg via INTRAVENOUS

## 2017-04-15 MED ORDER — SUGAMMADEX SODIUM 200 MG/2ML IV SOLN
INTRAVENOUS | Status: AC
  Filled 2017-04-15: qty 2

## 2017-04-15 MED ORDER — VANCOMYCIN HCL 10 G IV SOLR
1250.0000 mg | INTRAVENOUS | Status: DC
  Administered 2017-04-15: 1250 mg via INTRAVENOUS
  Filled 2017-04-15 (×2): qty 1250

## 2017-04-15 MED ORDER — ACETAMINOPHEN 10 MG/ML IV SOLN
1000.0000 mg | Freq: Four times a day (QID) | INTRAVENOUS | Status: DC
  Administered 2017-04-15: 1000 mg via INTRAVENOUS

## 2017-04-15 MED ORDER — LIDOCAINE 2% (20 MG/ML) 5 ML SYRINGE
INTRAMUSCULAR | Status: DC | PRN
  Administered 2017-04-15: 100 mg via INTRAVENOUS

## 2017-04-15 MED ORDER — ORAL CARE MOUTH RINSE
15.0000 mL | Freq: Two times a day (BID) | OROMUCOSAL | Status: DC
  Administered 2017-04-15 – 2017-04-22 (×13): 15 mL via OROMUCOSAL

## 2017-04-15 MED ORDER — SUGAMMADEX SODIUM 200 MG/2ML IV SOLN
INTRAVENOUS | Status: DC | PRN
  Administered 2017-04-15: 200 mg via INTRAVENOUS

## 2017-04-15 MED ORDER — HYDROMORPHONE HCL 1 MG/ML IJ SOLN
1.0000 mg | INTRAMUSCULAR | Status: DC | PRN
  Administered 2017-04-15: 1 mg via INTRAVENOUS
  Administered 2017-04-15 – 2017-04-18 (×9): 2 mg via INTRAVENOUS
  Filled 2017-04-15: qty 1
  Filled 2017-04-15 (×9): qty 2

## 2017-04-15 MED ORDER — SUCCINYLCHOLINE CHLORIDE 200 MG/10ML IV SOSY
PREFILLED_SYRINGE | INTRAVENOUS | Status: DC | PRN
  Administered 2017-04-15: 160 mg via INTRAVENOUS

## 2017-04-15 MED ORDER — LIDOCAINE 2% (20 MG/ML) 5 ML SYRINGE
INTRAMUSCULAR | Status: AC
  Filled 2017-04-15: qty 5

## 2017-04-15 MED ORDER — EPHEDRINE SULFATE-NACL 50-0.9 MG/10ML-% IV SOSY
PREFILLED_SYRINGE | INTRAVENOUS | Status: DC | PRN
  Administered 2017-04-15 (×2): 10 mg via INTRAVENOUS

## 2017-04-15 MED ORDER — PROPOFOL 10 MG/ML IV BOLUS
INTRAVENOUS | Status: DC | PRN
  Administered 2017-04-15: 160 mg via INTRAVENOUS

## 2017-04-15 SURGICAL SUPPLY — 21 items
BLADE HEX COATED 2.75 (ELECTRODE) ×2 IMPLANT
BLADE SURG SZ10 CARB STEEL (BLADE) ×4 IMPLANT
BNDG GAUZE ELAST 4 BULKY (GAUZE/BANDAGES/DRESSINGS) ×8 IMPLANT
COVER SURGICAL LIGHT HANDLE (MISCELLANEOUS) ×2 IMPLANT
DRAPE LAPAROSCOPIC ABDOMINAL (DRAPES) ×2 IMPLANT
DRAPE SHEET LG 3/4 BI-LAMINATE (DRAPES) ×2 IMPLANT
ELECT BLADE TIP CTD 4 INCH (ELECTRODE) ×2 IMPLANT
ELECT PENCIL ROCKER SW 15FT (MISCELLANEOUS) ×2 IMPLANT
ELECT REM PT RETURN 15FT ADLT (MISCELLANEOUS) ×2 IMPLANT
GLOVE BIOGEL PI IND STRL 7.0 (GLOVE) ×1 IMPLANT
GLOVE BIOGEL PI INDICATOR 7.0 (GLOVE) ×1
GOWN STRL REUS W/TWL XL LVL3 (GOWN DISPOSABLE) ×2 IMPLANT
KIT BASIN OR (CUSTOM PROCEDURE TRAY) ×2 IMPLANT
NS IRRIG 1000ML POUR BTL (IV SOLUTION) ×4 IMPLANT
PACK BASIC VI WITH GOWN DISP (CUSTOM PROCEDURE TRAY) ×2 IMPLANT
PAD ABD 8X10 STRL (GAUZE/BANDAGES/DRESSINGS) ×8 IMPLANT
SOL PREP POV-IOD 4OZ 10% (MISCELLANEOUS) ×2 IMPLANT
SPONGE LAP 18X18 X RAY DECT (DISPOSABLE) ×4 IMPLANT
SUPPORT SCROTAL LG STRP (MISCELLANEOUS) IMPLANT
TOWEL OR 17X26 10 PK STRL BLUE (TOWEL DISPOSABLE) ×4 IMPLANT
YANKAUER SUCT BULB TIP 10FT TU (MISCELLANEOUS) ×2 IMPLANT

## 2017-04-15 NOTE — Op Note (Signed)
Operative Note  Preoperative diagnosis:  1.  Fournier's gangrene of the scrotum  Postoperative diagnosis: 1.  Same  Procedure(s): 1.  Scrotal wound debridement 2. Scrotal packing change  Surgeon: Ellison Hughs, MD  Assistants:  None  Anesthesia:  Gen.  Complications:  None  EBL:  10 mL  Specimens: 1. None  Drains/Catheters: 1.  Foley catheter  Intraoperative findings:  Improved scrotal skin and subcutaneous fat wound edges. There was a small amount of necrotic tissue along the right lateral hemiscrotum, but much improved from yesterday's debridement.  Indication:  [name] is a 56 y.o. male with Fournier's gangrene of the scrotum. He was initially brought to [facility] secondary to worsening posterior scrotal pain for the past 72 hours. He had a scrotal ultrasound performed on 04/13/2017 that demonstrated a loculated scrotal abscess was signs concerning for of left epididymitis. Following a course of broad-spectrum antibiotics, the patient's clinical status showed little improvement. He eventually had a CT of the abdomen/pelvis with contrast 04/14/2017 that demonstrated air within the scrotal skin extending along the right hemiscrotum, consistent with Fournier's gangrene. He is status post scrotal wound debridement abscess drainage on 04/14/2017. He is here today for scrotal packing change and further wound debridement. He has been consented for the above procedures, voices understanding and wishes to proceed.   Description of procedure:  After informed consent was obtained, the patient was brought to the operating room and general LMA anesthesia was administered. The patient was then placed in the dorsolithotomy position and prepped and draped in usual sterile fashion. A timeout was performed.  Total for Kerlix dressings that were placed last night, were removed. The wound edges along the scrotal skin and subcutaneous tissue looked well vascularized area  there is a small amount of necrotic tissue measuring approximately 5 x 3 x 3 along the lateral aspects the right hemiscrotum that was excised using electrocautery. Otherwise, there is no other signs of necrotic tissue. The wound cavity was then packed with for Kerlix gauze rolls and covered with 4 ABD pads. Scrotal support was then applied. He tolerated the procedure well and was transferred to the postanesthesia in stable condition.  Plan:  Will plan for another scrotal wound debridement on 04/17/2017, pending any changes in his clinical status.

## 2017-04-15 NOTE — Anesthesia Preprocedure Evaluation (Signed)
Anesthesia Evaluation  Patient identified by MRN, date of birth, ID band Patient awake    Reviewed: Allergy & Precautions, NPO status , Patient's Chart, lab work & pertinent test results  Airway Mallampati: II  TM Distance: >3 FB Neck ROM: Full    Dental no notable dental hx.    Pulmonary sleep apnea , former smoker,    breath sounds clear to auscultation + decreased breath sounds      Cardiovascular hypertension, + Past MI  + dysrhythmias Atrial Fibrillation  Rhythm:Irregular Rate:Normal     Neuro/Psych negative neurological ROS  negative psych ROS   GI/Hepatic negative GI ROS, Neg liver ROS,   Endo/Other  diabetes, Insulin DependentMorbid obesity  Renal/GU Renal InsufficiencyRenal disease  negative genitourinary   Musculoskeletal negative musculoskeletal ROS (+)   Abdominal (+) + obese,   Peds negative pediatric ROS (+)  Hematology negative hematology ROS (+) anemia ,   Anesthesia Other Findings   Reproductive/Obstetrics negative OB ROS                             Anesthesia Physical Anesthesia Plan  ASA: IV  Anesthesia Plan: General   Post-op Pain Management:    Induction: Intravenous  PONV Risk Score and Plan: 1 and Ondansetron and Treatment may vary due to age or medical condition  Airway Management Planned: Oral ETT  Additional Equipment:   Intra-op Plan:   Post-operative Plan: Extubation in OR  Informed Consent: I have reviewed the patients History and Physical, chart, labs and discussed the procedure including the risks, benefits and alternatives for the proposed anesthesia with the patient or authorized representative who has indicated his/her understanding and acceptance.   Dental advisory given  Plan Discussed with: CRNA and Surgeon  Anesthesia Plan Comments:         Anesthesia Quick Evaluation

## 2017-04-15 NOTE — Progress Notes (Signed)
Pharmacy Antibiotic Note  [name] is a 56 y.o. male admitted on 04/13/2017 with scrotal cellulitis and epididymitis with possible Fournier's gangrene. Pharmacy has been consulted for vancomycin dosing.  Plan: 1) To redose vancomycin based on AUC dosing per protocol - Change vancomycin to 1250mg  IV q24 (est AUC 428 (goal 400-500) using SCr of 2.36)  2) Continue current Zosyn dosing 3) F/u renal function, cultures and clinical course  Height: 6' 2.5" (189.2 cm) Weight: (!) 382 lb 3.2 oz (173.4 kg) IBW/kg (Calculated) : 83.35  Temp (24hrs), Avg:99.4 F (37.4 C), Min:98.2 F (36.8 C), Max:101.1 F (38.4 C)  Recent Labs  Lab 04/13/17 1143 04/14/17 0530  WBC 16.6* 16.4*  CREATININE 1.91* 2.36*    Normalized CrCl = 40mls/min Estimated Creatinine Clearance: 57.6 mL/min (A) (by C-G formula based on SCr of 2.36 mg/dL (H)).    No Known Allergies  Antimicrobials this admission: Cefepime 11/12>> 11/13 Levaquin 11/12 >> 11/12 vanc 11/13 >> Zosyn 11/13 >>  Dose adjustments this admission: See plan above  Microbiology results: 11/12 BCx: ngtd 11/12 UCx: pending   Thank you for allowing pharmacy to be a part of this patient's care.  Adrian Saran, PharmD, BCPS Pager [phone] 04/15/2017 9:09 AM

## 2017-04-15 NOTE — Progress Notes (Signed)
PROGRESS NOTE    [name]  YIF:[id number] DOB: Feb 25, 1961 DOA: 04/13/2017 PCP: [name], MD   Brief Narrative:  [name] is a 56 y.o. BM PMHx MI, Chronic Systolic CHF (EF 86-76%), PAF/atrial flutter on (xarelto), HTN, HLD, DM Type II , CKD stage III, chronic diarrhea, Multiple myeloma, OSA on CPAP, previous h/o epidermitis who presented with scrotal pain. Patient states that he was not able to ambulate well due to his recent toe surgery. He was sliding down from his bed and thinks he had some laceration in the posterior wall of his scrotum. He developed swelling and and pain at the scrotum for 3 days. Pain is worse with putting some weight or moving across the bed. He reports previous similar symptoms and diagnosed with epididymitis, treated with antibiotics. He denies fever, chills, chest pains, shortness of breath. In the ED, scrotal ultrasound showed prominent epididymis on the left with slight increased vascularity. This may represent a component of epididymitis. EDP discussed with urology Dr. Pilar Jarvis who agreed for IV antibiotic treatment.     Subjective: 11/14 A/O 4, negative CP, negative SOB. Positive extreme pubic/peritoneal pain. States pain starts in upper groin area and radiates down through pubis and peritoneal area Rt>>> Lt. pain rated 10/10. States does not know his base weight nor does he weigh himself daily.    Assessment & Plan:   Principal Problem:   Fournier gangrene Active Problems:   Hypertension   Atrial flutter (HCC)   Anemia, normocytic normochromic   Epididymitis   Acute epididymitis   Severe Sepsis secondary to  Fournier's Gangrene of the Scrotum  -11/13 Patient S/P debridement scrotal abscess -Scheduled for further surgical debridement today. -Blood culture pending -Continue current antibiotics  Diabetes type 2 uncontrolled with complications -72/0 Hemoglobin A1c= 8.2 -Levemir 40 units daily -Sensitive SSI   Acute on CKD stage  III (baseline Cr 1.7 -1.9 )  Recent Labs  Lab 04/13/17 1143 04/14/17 0530 04/15/17 1600  CREATININE 1.91* 2.36* 2.36*  -Worsening renal failure most likely secondary to sepsis, continue monitor closely post surgery  Chronic Systolic CHF (EF 94-70%) -Unknown base weight -Strict in and out -Daily weight -Transfuse for hemoglobin<8   HTN -Continue metoprolol    CAD -Continue aspirin, lipitor   Paroxysmal atrial fibrillation -Hold anticoagulation secondary to surgery   Multiple myeloma -Continue decadron, eluxadoline, revlimid, bactrim, acyclovir       DVT prophylaxis: SCD Code Status: Full Family Communication: Wife present at bedside Disposition Plan: TBD   Consultants:  Urology Dr. Conception Oms Winter     Procedures/Significant Events:  11/13 S/P I&D necrotic scrotal skin and drainage of scrotal abscess     I have personally reviewed and interpreted all radiology studies and my findings are as above.  VENTILATOR SETTINGS:    Cultures   Antimicrobials: Anti-infectives (From admission, onward)   Start     Stop   04/15/17 1400  vancomycin (VANCOCIN) 1,250 mg in sodium chloride 0.9 % 250 mL IVPB         04/15/17 1300  vancomycin (VANCOCIN) 1,750 mg in sodium chloride 0.9 % 500 mL IVPB  Status:  Discontinued     04/15/17 0911   04/15/17 0900  sulfamethoxazole-trimethoprim (BACTRIM DS,SEPTRA DS) 800-160 MG per tablet 1 tablet         04/14/17 1944  piperacillin-tazobactam (ZOSYN) 3.375 GM/50ML IVPB    Comments:  Williford, Peggy   : cabinet override   04/14/17 2024   04/14/17 1630  clindamycin (CLEOCIN)  IVPB 900 mg  Status:  Discontinued     04/14/17 2246   04/14/17 1400  piperacillin-tazobactam (ZOSYN) IVPB 3.375 g         04/14/17 1315  vancomycin (VANCOCIN) IVPB 1000 mg/200 mL premix     04/14/17 1836   04/14/17 1000  acyclovir (ZOVIRAX) tablet 400 mg         04/13/17 2000  ceFEPIme (MAXIPIME) 2 g in dextrose 5 % 50 mL IVPB  Status:   Discontinued     04/14/17 1309   04/13/17 1800  doxycycline (VIBRA-TABS) tablet 100 mg  Status:  Discontinued     04/14/17 1309   04/13/17 1300  levofloxacin (LEVAQUIN) IVPB 500 mg     04/13/17 1507       Devices    LINES / TUBES:      Continuous Infusions: . sodium chloride 100 mL/hr at 04/15/17 0635  . lactated ringers 125 mL/hr at 04/14/17 2318  . piperacillin-tazobactam (ZOSYN)  IV 3.375 g (04/15/17 0629)  . vancomycin       Objective: Vitals:   04/15/17 0200 04/15/17 0400 04/15/17 0500 04/15/17 0600  BP: 105/66 114/69 113/61 (!) 104/58  Pulse: 78 77 71 70  Resp: (!) 22 (!) _0 Temp:  98.2 F (36.8 C)    TempSrc:  Axillary    SpO2: 97% 100% 100% 100%  Weight:      Height:        Intake/Output Summary (Last 24 hours) at 04/15/2017 0752 Last data filed at 04/15/2017 0600 Gross per 24 hour  Intake 2437.5 ml  Output 2525 ml  Net -87.5 ml   Filed Weights   04/13/17 1018 04/13/17 1613  Weight: (!) 370 lb (167.8 kg) (!) 382 lb 3.2 oz (173.4 kg)    Examination:  General: A/O 4, No acute respiratory distress, severe pain Neck:  Negative scars, masses, torticollis, lymphadenopathy, JVD Lungs: Clear to auscultation bilaterally without wheezes or crackles Cardiovascular: Tachycardia, Regular rhythm without murmur gallop or rub normal S1 and S2 Abdomen: MORBIDLY OBESE, negative abdominal pain, nondistended, positive soft, bowel sounds, no rebound, no ascites, no appreciable mass. Pain to palpation bilateral inguinal area radiating down to pubis and scrotum Rt>>Lt. Erythema, warm to touch. Peritoneal area serosanguineous drainage  Extremities: No significant cyanosis, clubbing, or edema bilateral lower extremities Skin: Negative rashes, lesions, ulcers Psychiatric:  Negative depression, negative anxiety, negative fatigue, negative mania  Central nervous system:  Cranial nerves II through XII intact, tongue/uvula midline, all extremities muscle strength 5/5,  sensation intact throughout, negative dysarthria, negative expressive aphasia, negative receptive aphasia.  .     Data Reviewed: Care during the described time interval was provided by me .  I have reviewed this patient's available data, including medical history, events of note, physical examination, and all test results as part of my evaluation.   CBC: Recent Labs  Lab 04/13/17 1143 04/14/17 0530  WBC 16.6* 16.4*  NEUTROABS 14.7*  --   HGB 9.0* 8.5*  HCT 28.9* 26.8*  MCV 95.1 94.7  PLT 157 072*   Basic Metabolic Panel: Recent Labs  Lab 04/13/17 1143 04/14/17 0530  NA 135 134*  K 4.0 4.7  CL 103 103  CO2 23 24  GLUCOSE 229* 239*  BUN 41* 42*  CREATININE 1.91* 2.36*  CALCIUM 7.8* 7.8*   GFR: Estimated Creatinine Clearance: 57.6 mL/min (A) (by C-G formula based on SCr of 2.36 mg/dL (H)). Liver Function Tests: No results for input(s): AST, ALT, ALKPHOS, BILITOT,  PROT, ALBUMIN in the last 168 hours. No results for input(s): LIPASE, AMYLASE in the last 168 hours. No results for input(s): AMMONIA in the last 168 hours. Coagulation Profile: No results for input(s): INR, PROTIME in the last 168 hours. Cardiac Enzymes: No results for input(s): CKTOTAL, CKMB, CKMBINDEX, TROPONINI in the last 168 hours. BNP (last 3 results) No results for input(s): PROBNP in the last 8760 hours. HbA1C: No results for input(s): HGBA1C in the last 72 hours. CBG: Recent Labs  Lab 04/13/17 1714 04/13/17 2120 04/14/17 0735 04/14/17 1122 04/14/17 1705  GLUCAP 216* 232* 205* 226* 369*   Lipid Profile: No results for input(s): CHOL, HDL, LDLCALC, TRIG, CHOLHDL, LDLDIRECT in the last 72 hours. Thyroid Function Tests: No results for input(s): TSH, T4TOTAL, FREET4, T3FREE, THYROIDAB in the last 72 hours. Anemia Panel: No results for input(s): VITAMINB12, FOLATE, FERRITIN, TIBC, IRON, RETICCTPCT in the last 72 hours. Urine analysis:    Component Value Date/Time   COLORURINE YELLOW  04/13/2017 1118   APPEARANCEUR HAZY (A) 04/13/2017 1118   LABSPEC 1.009 04/13/2017 1118   PHURINE 5.0 04/13/2017 1118   GLUCOSEU NEGATIVE 04/13/2017 1118   HGBUR SMALL (A) 04/13/2017 1118   BILIRUBINUR NEGATIVE 04/13/2017 1118   KETONESUR NEGATIVE 04/13/2017 1118   PROTEINUR 100 (A) 04/13/2017 1118   UROBILINOGEN 0.2 10/03/2012 1809   NITRITE NEGATIVE 04/13/2017 1118   LEUKOCYTESUR MODERATE (A) 04/13/2017 1118   Sepsis Labs: _0 (procalcitonin:4,lacticidven:4)  ) Recent Results (from the past 240 hour(s))  Culture, blood (routine x 2)     Status: None (Preliminary result)   Collection Time: 04/13/17  6:17 PM  Result Value Ref Range Status   Specimen Description BLOOD LEFT HAND  Final   Special Requests   Final    BOTTLES DRAWN AEROBIC AND ANAEROBIC Blood Culture adequate volume   Culture NO GROWTH 2 DAYS  Final   Report Status PENDING  Incomplete  Culture, blood (routine x 2)     Status: None (Preliminary result)   Collection Time: 04/13/17  6:17 PM  Result Value Ref Range Status   Specimen Description LEFT ANTECUBITAL  Final   Special Requests   Final    BOTTLES DRAWN AEROBIC AND ANAEROBIC Blood Culture adequate volume   Culture NO GROWTH 2 DAYS  Final   Report Status PENDING  Incomplete  MRSA PCR Screening     Status: None   Collection Time: 04/14/17  6:50 PM  Result Value Ref Range Status   MRSA by PCR NEGATIVE NEGATIVE Final    Comment:        The GeneXpert MRSA Assay (FDA approved for NASAL specimens only), is one component of a comprehensive MRSA colonization surveillance program. It is not intended to diagnose MRSA infection nor to guide or monitor treatment for MRSA infections.          Radiology Studies: US Scrotum  Result Date: 04/13/2017 CLINICAL DATA:  Pain and swelling for several days left greater than right EXAM: SCROTAL ULTRASOUND DOPPLER ULTRASOUND OF THE TESTICLES TECHNIQUE: Complete ultrasound examination of the testicles,  epididymis, and other scrotal structures was performed. Color and spectral Doppler ultrasound were also utilized to evaluate blood flow to the testicles. COMPARISON:  None. FINDINGS: Right testicle Measurements: 3.5 x 2.3 x 2.5 cm. No mass or microlithiasis visualized. Left testicle Measurements: 3.5 x 2.4 x 2.4 cm. No mass or microlithiasis visualized. Right epididymis:  Normal in size and appearance. Left epididymis:  Prominent with slight increased vascularity. Hydrocele:  Small hydroceles noted bilaterally.  Varicocele:  None visualized. Pulsed Doppler interrogation of both testes demonstrates normal low resistance arterial and venous waveforms bilaterally. Note is made of scrotal thickening bilaterally. IMPRESSION: Prominent epididymis on the left with slight increased vascularity. This may represent a component of epididymitis. Normal-appearing testicles bilaterally. Small hydroceles bilaterally with scrotal skin thickening. Electronically Signed   By: Inez Catalina M.D.   On: 04/13/2017 12:37   Ct Abdomen Pelvis W Contrast  Addendum Date: 04/14/2017   ADDENDUM REPORT: 04/14/2017 15:53 ADDENDUM: These results were called by telephone at the time of interpretation on 04/14/2017 at 3:35 pm to Dr. Dessa Phi , who verbally acknowledged these results. Electronically Signed   By: Julian Hy M.D.   On: 04/14/2017 15:53   Result Date: 04/14/2017 CLINICAL DATA:  Severe scrotal swelling, scrotal pain, sepsis, epididymitis, diabetic, evaluate for Fournier's gangrene EXAM: CT ABDOMEN AND PELVIS WITH CONTRAST TECHNIQUE: Multidetector CT imaging of the abdomen and pelvis was performed using the standard protocol following bolus administration of intravenous contrast. CONTRAST:  13m ISOVUE-300 IOPAMIDOL (ISOVUE-300) INJECTION 61% COMPARISON:  None. FINDINGS: Motion degraded images. Lower chest: Eventration of the left hemidiaphragm. Hepatobiliary: Geographic hepatic steatosis. Layering 4 mm gallstone  (series 2/ image 29), without associated inflammatory changes. No intrahepatic or extrahepatic ductal dilatation. Pancreas: Within normal limits. Spleen: Within normal limits. Adrenals/Urinary Tract: Adrenal glands are within normal limits. Bilateral lower pole nonobstructing renal calculi measuring 3 mm (series 2/ images 4445). Mildly heterogeneous enhancement on the right. No hydronephrosis. Bladder is within normal limits. Stomach/Bowel: Stomach is within normal limits. No evidence of bowel obstruction. Normal appendix (series 2/image 71). Vascular/Lymphatic: No evidence of abdominal aortic aneurysm. Atherosclerotic calcifications of the abdominal aorta and branch vessels. No suspicious abdominopelvic lymphadenopathy. Small right inguinal nodes measuring up to 15 mm on the right (series 2/ image 98), likely reactive. Reproductive: Prostate is grossly unremarkable. Scrotal wall thickening/ edema with associated subcutaneous gas on the right (series 3/image 16). Other: No abdominopelvic ascites. Tiny fat containing periumbilical hernia. Subcutaneous injection sites along the anterior abdominal wall. Musculoskeletal: Degenerative changes of the visualized thoracolumbar spine. IMPRESSION: Scrotal wall thickening/edema with associated subcutaneous gas on the right. This appearance suggests infection with a gas-forming organism. Fournier's gangrene is the diagnosis of exclusion. Electronically Signed: By: SJulian HyM.D. On: 04/14/2017 15:33   [place]Pelvic Doppler (torsion R/o Or Mass Arterial Flow)  Result Date: 04/13/2017 CLINICAL DATA:  Pain and swelling for several days left greater than right EXAM: SCROTAL ULTRASOUND DOPPLER ULTRASOUND OF THE TESTICLES TECHNIQUE: Complete ultrasound examination of the testicles, epididymis, and other scrotal structures was performed. Color and spectral Doppler ultrasound were also utilized to evaluate blood flow to the testicles. COMPARISON:  None. FINDINGS: Right  testicle Measurements: 3.5 x 2.3 x 2.5 cm. No mass or microlithiasis visualized. Left testicle Measurements: 3.5 x 2.4 x 2.4 cm. No mass or microlithiasis visualized. Right epididymis:  Normal in size and appearance. Left epididymis:  Prominent with slight increased vascularity. Hydrocele:  Small hydroceles noted bilaterally. Varicocele:  None visualized. Pulsed Doppler interrogation of both testes demonstrates normal low resistance arterial and venous waveforms bilaterally. Note is made of scrotal thickening bilaterally. IMPRESSION: Prominent epididymis on the left with slight increased vascularity. This may represent a component of epididymitis. Normal-appearing testicles bilaterally. Small hydroceles bilaterally with scrotal skin thickening. Electronically Signed   By: MInez CatalinaM.D.   On: 04/13/2017 12:37        Scheduled Meds: . acyclovir  400 mg Oral q morning - 10a  .  allopurinol  300 mg Oral Daily  . atorvastatin  80 mg Oral QHS  . calcitRIOL  0.25 mcg Oral Once per day on Mon Wed Fri  . Chlorhexidine Gluconate Cloth  6 each Topical Daily  . colchicine  0.6 mg Oral Daily  . [START ON 04/17/2017] dexamethasone  10 mg Oral Q Fri-1800  . Eluxadoline  100 mg Oral Daily  . gabapentin  300 mg Oral QHS  . HYDROmorphone      . insulin aspart  0-9 Units Subcutaneous TID WC  . insulin detemir  40 Units Subcutaneous q morning - 10a  . [START ON 04/19/2017] lenalidomide  5 mg Oral Daily  . metoprolol succinate  25 mg Oral Daily  . multivitamin with minerals  1 tablet Oral Daily  . pantoprazole  40 mg Oral Daily  . sulfamethoxazole-trimethoprim  1 tablet Oral Once per day on Mon Wed Fri  . torsemide  40 mg Oral Daily  . [START ON 04/20/2017] Vitamin D (Ergocalciferol)  50,000 Units Oral Q7 days   Continuous Infusions: . sodium chloride 100 mL/hr at 04/15/17 0635  . lactated ringers 125 mL/hr at 04/14/17 2318  . piperacillin-tazobactam (ZOSYN)  IV 3.375 g (04/15/17 0629)  . vancomycin         LOS: 1 day    Time spent: 40 minutes    Sorah Falkenstein, Geraldo Docker, MD [facility] Pager [phone]   If 7PM-7AM, please contact night-coverage [email or web address] Password [email or web address] 04/15/2017, 7:52 AM

## 2017-04-15 NOTE — Care Management Note (Signed)
Case Management Note  Patient Details  Name: ZAEDEN LASTINGER MRN: [id number] Date of Birth: [birth year]  Subjective/Objective:                  testicular pain  Action/Plan: Date: April 15, 2017 Velva Harman, BSN, [place], [place] Chart and notes review for patient progress and needs. Will follow for case management and discharge needs. Next review date: [id number]  Expected Discharge Date:                  Expected Discharge Plan:  Home/Self Care  In-House Referral:     Discharge planning Services  CM Consult  Post Acute Care Choice:    Choice offered to:     DME Arranged:    DME Agency:     HH Arranged:    HH Agency:     Status of Service:  In process, will continue to follow  If discussed at Long Length of Stay Meetings, dates discussed:    Additional Comments:  Leeroy Cha, RN 04/15/2017, 8:25 AM

## 2017-04-15 NOTE — Interval H&P Note (Signed)
History and Physical Interval Note:  04/15/2017 5:54 PM  Roda Shutters  has presented today for surgery, with the diagnosis of Scrotal Fournier's Gangrene  The various methods of treatment have been discussed with the patient and family. After consideration of risks, benefits and other options for treatment, the patient has consented to  Procedure(s): [place] (N/A) as a surgical intervention .  The patient's history has been reviewed, patient examined, no change in status, stable for surgery.  I have reviewed the patient's chart and labs.  Questions were answered to the patient's satisfaction.     Conception Oms Paola Flynt

## 2017-04-15 NOTE — Progress Notes (Signed)
1 Day Post-Op Subjective: No acute events overnight.  Pain controlled so long as his scrotum is not manipulated.  Objective: Vital signs in last 24 hours: Temp:  [98.2 F (36.8 C)-101.1 F (38.4 C)] 98.5 F (36.9 C) (11/14 0800) Pulse Rate:  [70-95] 71 (11/14 0900) Resp:  [13-25] 19 (11/14 0900) BP: (93-168)/(50-147) 112/55 (11/14 0900) SpO2:  [91 %-100 %] 91 % (11/14 0900)  Intake/Output from previous day: 11/13 0701 - 11/14 0700 In: 2437.5 [P.O.:600; I.V.:1837.5] Out: 2525 [Urine:2325; Blood:200]  Intake/Output this shift: Total I/O In: 1418.3 [I.V.:1268.3; IV Piggyback:150] Out: -   Physical Exam:  General: Alert and oriented CV: RRR, palpable distal pulses Lungs: CTAB, equal chest rise Abdomen: Soft, NTND, no rebound or guarding Incisions: Scrotal dressings are slightly blood tinged and in place Ext: NT, No erythema GU: Foley draining yellow urine  Lab Results: Recent Labs    04/13/17 1143 04/14/17 0530  HGB 9.0* 8.5*  HCT 28.9* 26.8*   BMET Recent Labs    04/13/17 1143 04/14/17 0530  NA 135 134*  K 4.0 4.7  CL 103 103  CO2 23 24  GLUCOSE 229* 239*  BUN 41* 42*  CREATININE 1.91* 2.36*  CALCIUM 7.8* 7.8*     Studies/Results: US Scrotum  Result Date: 04/13/2017 CLINICAL DATA:  Pain and swelling for several days left greater than right EXAM: SCROTAL ULTRASOUND DOPPLER ULTRASOUND OF THE TESTICLES TECHNIQUE: Complete ultrasound examination of the testicles, epididymis, and other scrotal structures was performed. Color and spectral Doppler ultrasound were also utilized to evaluate blood flow to the testicles. COMPARISON:  None. FINDINGS: Right testicle Measurements: 3.5 x 2.3 x 2.5 cm. No mass or microlithiasis visualized. Left testicle Measurements: 3.5 x 2.4 x 2.4 cm. No mass or microlithiasis visualized. Right epididymis:  Normal in size and appearance. Left epididymis:  Prominent with slight increased vascularity. Hydrocele:  Small hydroceles noted  bilaterally. Varicocele:  None visualized. Pulsed Doppler interrogation of both testes demonstrates normal low resistance arterial and venous waveforms bilaterally. Note is made of scrotal thickening bilaterally. IMPRESSION: Prominent epididymis on the left with slight increased vascularity. This may represent a component of epididymitis. Normal-appearing testicles bilaterally. Small hydroceles bilaterally with scrotal skin thickening. Electronically Signed   By: Inez Catalina M.D.   On: 04/13/2017 12:37   Ct Abdomen Pelvis W Contrast  Addendum Date: 04/14/2017   ADDENDUM REPORT: 04/14/2017 15:53 ADDENDUM: These results were called by telephone at the time of interpretation on 04/14/2017 at 3:35 pm to Dr. Dessa Phi , who verbally acknowledged these results. Electronically Signed   By: Julian Hy M.D.   On: 04/14/2017 15:53   Result Date: 04/14/2017 CLINICAL DATA:  Severe scrotal swelling, scrotal pain, sepsis, epididymitis, diabetic, evaluate for Fournier's gangrene EXAM: CT ABDOMEN AND PELVIS WITH CONTRAST TECHNIQUE: Multidetector CT imaging of the abdomen and pelvis was performed using the standard protocol following bolus administration of intravenous contrast. CONTRAST:  160m ISOVUE-300 IOPAMIDOL (ISOVUE-300) INJECTION 61% COMPARISON:  None. FINDINGS: Motion degraded images. Lower chest: Eventration of the left hemidiaphragm. Hepatobiliary: Geographic hepatic steatosis. Layering 4 mm gallstone (series 2/ image 29), without associated inflammatory changes. No intrahepatic or extrahepatic ductal dilatation. Pancreas: Within normal limits. Spleen: Within normal limits. Adrenals/Urinary Tract: Adrenal glands are within normal limits. Bilateral lower pole nonobstructing renal calculi measuring 3 mm (series 2/ images 4445). Mildly heterogeneous enhancement on the right. No hydronephrosis. Bladder is within normal limits. Stomach/Bowel: Stomach is within normal limits. No evidence of bowel  obstruction. Normal appendix (series 2/image  23). Vascular/Lymphatic: No evidence of abdominal aortic aneurysm. Atherosclerotic calcifications of the abdominal aorta and branch vessels. No suspicious abdominopelvic lymphadenopathy. Small right inguinal nodes measuring up to 15 mm on the right (series 2/ image 98), likely reactive. Reproductive: Prostate is grossly unremarkable. Scrotal wall thickening/ edema with associated subcutaneous gas on the right (series 3/image 16). Other: No abdominopelvic ascites. Tiny fat containing periumbilical hernia. Subcutaneous injection sites along the anterior abdominal wall. Musculoskeletal: Degenerative changes of the visualized thoracolumbar spine. IMPRESSION: Scrotal wall thickening/edema with associated subcutaneous gas on the right. This appearance suggests infection with a gas-forming organism. Fournier's gangrene is the diagnosis of exclusion. Electronically Signed: By: Julian Hy M.D. On: 04/14/2017 15:33   US Pelvic Doppler (torsion R/o Or Mass Arterial Flow)  Result Date: 04/13/2017 CLINICAL DATA:  Pain and swelling for several days left greater than right EXAM: SCROTAL ULTRASOUND DOPPLER ULTRASOUND OF THE TESTICLES TECHNIQUE: Complete ultrasound examination of the testicles, epididymis, and other scrotal structures was performed. Color and spectral Doppler ultrasound were also utilized to evaluate blood flow to the testicles. COMPARISON:  None. FINDINGS: Right testicle Measurements: 3.5 x 2.3 x 2.5 cm. No mass or microlithiasis visualized. Left testicle Measurements: 3.5 x 2.4 x 2.4 cm. No mass or microlithiasis visualized. Right epididymis:  Normal in size and appearance. Left epididymis:  Prominent with slight increased vascularity. Hydrocele:  Small hydroceles noted bilaterally. Varicocele:  None visualized. Pulsed Doppler interrogation of both testes demonstrates normal low resistance arterial and venous waveforms bilaterally. Note is made of scrotal  thickening bilaterally. IMPRESSION: Prominent epididymis on the left with slight increased vascularity. This may represent a component of epididymitis. Normal-appearing testicles bilaterally. Small hydroceles bilaterally with scrotal skin thickening. Electronically Signed   By: Inez Catalina M.D.   On: 04/13/2017 12:37    Assessment/Plan: [name] is a 56 y.o. male with epididymitis and free air within the scrotum, concerning for Fourniers gangrene.   POD1 s/p scrotal debridement Poorly controlled Type II DM Obesity Multiple Myeloma CKD PAF (on Xarelto)  -Will plan to take the patient back to the OR this afternoon for a dressing change and re-debridement.   -AM labs pending -Continue broad spectrum abx -Will continue to follow     LOS: 1 day   Ellison Hughs, MD 04/15/2017, 10:14 AM  [facility] Pager: [phone]

## 2017-04-15 NOTE — Anesthesia Procedure Notes (Signed)
Procedure Name: Intubation Date/Time: 04/15/2017 6:34 PM Performed by: Elenor Wildes D, CRNA Pre-anesthesia Checklist: Patient identified, Emergency Drugs available, Suction available and Patient being monitored Patient Re-evaluated:Patient Re-evaluated prior to induction Oxygen Delivery Method: Circle system utilized Preoxygenation: Pre-oxygenation with 100% oxygen Induction Type: IV induction Ventilation: Mask ventilation without difficulty Laryngoscope Size: Mac and 4 Grade View: Grade III Tube type: Oral Number of attempts: 1 Airway Equipment and Method: Stylet and Oral airway Placement Confirmation: ETT inserted through vocal cords under direct vision,  positive ETCO2 and breath sounds checked- equal and bilateral Secured at: 22 cm Tube secured with: Tape Dental Injury: Teeth and Oropharynx as per pre-operative assessment

## 2017-04-15 NOTE — Consult Note (Addendum)
[facility] Nurse wound consult note Reason for Consult:Scrotal wound.  Patient is s/p debridement in the OR on 04/14/17 and is scheduled for dressing change and possible continued debridement in the OR today (04/15/17). Will stand by to see if serial debridements will continue in OR. No dressing change by Nursing today. Wound type: Infectious (Fournier's gangrene of the scrotum)  [facility] nursing team will follow along, and will remain available to this patient, as well as the nursing, surgical and medical teams.   Thanks, Maudie Flakes, MSN, RN, [place], Arther Abbott  Pager# [phone]

## 2017-04-15 NOTE — Transfer of Care (Signed)
Immediate Anesthesia Transfer of Care Note  Patient: [name]  Procedure(s) Performed: DEBRIDEMENT SCROTAL WOUND AND DRESSING CHANGE (N/A )  Patient Location: PACU  Anesthesia Type:General  Level of Consciousness: awake, alert  and oriented  Airway & Oxygen Therapy: Patient Spontanous Breathing and Patient connected to face mask oxygen  Post-op Assessment: Report given to RN and Post -op Vital signs reviewed and stable  Post vital signs: Reviewed and stable  Last Vitals:  Vitals:   04/15/17 1700 04/15/17 1937  BP: (!) 143/90 (!) 160/112  Pulse: 92 (!) 103  Resp: 17 18  Temp:  36.5 C  SpO2: 93% 97%    Last Pain:  Vitals:   04/15/17 1937  TempSrc: Oral  PainSc: 0-No pain      Patients Stated Pain Goal: 3 (75/42/37 0230)  Complications: No apparent anesthesia complications

## 2017-04-15 NOTE — H&P (View-Only) (Signed)
1 Day Post-Op Subjective: No acute events overnight.  Pain controlled so long as his scrotum is not manipulated.  Objective: Vital signs in last 24 hours: Temp:  [98.2 F (36.8 C)-101.1 F (38.4 C)] 98.5 F (36.9 C) (11/14 0800) Pulse Rate:  [70-95] 71 (11/14 0900) Resp:  [13-25] 19 (11/14 0900) BP: (93-168)/(50-147) 112/55 (11/14 0900) SpO2:  [91 %-100 %] 91 % (11/14 0900)  Intake/Output from previous day: 11/13 0701 - 11/14 0700 In: 2437.5 [P.O.:600; I.V.:1837.5] Out: 2525 [Urine:2325; Blood:200]  Intake/Output this shift: Total I/O In: 1418.3 [I.V.:1268.3; IV Piggyback:150] Out: -   Physical Exam:  General: Alert and oriented CV: RRR, palpable distal pulses Lungs: CTAB, equal chest rise Abdomen: Soft, NTND, no rebound or guarding Incisions: Scrotal dressings are slightly blood tinged and in place Ext: NT, No erythema GU: Foley draining yellow urine  Lab Results: Recent Labs    04/13/17 1143 04/14/17 0530  HGB 9.0* 8.5*  HCT 28.9* 26.8*   BMET Recent Labs    04/13/17 1143 04/14/17 0530  NA 135 134*  K 4.0 4.7  CL 103 103  CO2 23 24  GLUCOSE 229* 239*  BUN 41* 42*  CREATININE 1.91* 2.36*  CALCIUM 7.8* 7.8*     Studies/Results: US Scrotum  Result Date: 04/13/2017 CLINICAL DATA:  Pain and swelling for several days left greater than right EXAM: SCROTAL ULTRASOUND DOPPLER ULTRASOUND OF THE TESTICLES TECHNIQUE: Complete ultrasound examination of the testicles, epididymis, and other scrotal structures was performed. Color and spectral Doppler ultrasound were also utilized to evaluate blood flow to the testicles. COMPARISON:  None. FINDINGS: Right testicle Measurements: 3.5 x 2.3 x 2.5 cm. No mass or microlithiasis visualized. Left testicle Measurements: 3.5 x 2.4 x 2.4 cm. No mass or microlithiasis visualized. Right epididymis:  Normal in size and appearance. Left epididymis:  Prominent with slight increased vascularity. Hydrocele:  Small hydroceles noted  bilaterally. Varicocele:  None visualized. Pulsed Doppler interrogation of both testes demonstrates normal low resistance arterial and venous waveforms bilaterally. Note is made of scrotal thickening bilaterally. IMPRESSION: Prominent epididymis on the left with slight increased vascularity. This may represent a component of epididymitis. Normal-appearing testicles bilaterally. Small hydroceles bilaterally with scrotal skin thickening. Electronically Signed   By: Inez Catalina M.D.   On: 04/13/2017 12:37   Ct Abdomen Pelvis W Contrast  Addendum Date: 04/14/2017   ADDENDUM REPORT: 04/14/2017 15:53 ADDENDUM: These results were called by telephone at the time of interpretation on 04/14/2017 at 3:35 pm to Dr. Dessa Phi , who verbally acknowledged these results. Electronically Signed   By: Julian Hy M.D.   On: 04/14/2017 15:53   Result Date: 04/14/2017 CLINICAL DATA:  Severe scrotal swelling, scrotal pain, sepsis, epididymitis, diabetic, evaluate for Fournier's gangrene EXAM: CT ABDOMEN AND PELVIS WITH CONTRAST TECHNIQUE: Multidetector CT imaging of the abdomen and pelvis was performed using the standard protocol following bolus administration of intravenous contrast. CONTRAST:  165m ISOVUE-300 IOPAMIDOL (ISOVUE-300) INJECTION 61% COMPARISON:  None. FINDINGS: Motion degraded images. Lower chest: Eventration of the left hemidiaphragm. Hepatobiliary: Geographic hepatic steatosis. Layering 4 mm gallstone (series 2/ image 29), without associated inflammatory changes. No intrahepatic or extrahepatic ductal dilatation. Pancreas: Within normal limits. Spleen: Within normal limits. Adrenals/Urinary Tract: Adrenal glands are within normal limits. Bilateral lower pole nonobstructing renal calculi measuring 3 mm (series 2/ images 4445). Mildly heterogeneous enhancement on the right. No hydronephrosis. Bladder is within normal limits. Stomach/Bowel: Stomach is within normal limits. No evidence of bowel  obstruction. Normal appendix (series 2/image  66). Vascular/Lymphatic: No evidence of abdominal aortic aneurysm. Atherosclerotic calcifications of the abdominal aorta and branch vessels. No suspicious abdominopelvic lymphadenopathy. Small right inguinal nodes measuring up to 15 mm on the right (series 2/ image 98), likely reactive. Reproductive: Prostate is grossly unremarkable. Scrotal wall thickening/ edema with associated subcutaneous gas on the right (series 3/image 16). Other: No abdominopelvic ascites. Tiny fat containing periumbilical hernia. Subcutaneous injection sites along the anterior abdominal wall. Musculoskeletal: Degenerative changes of the visualized thoracolumbar spine. IMPRESSION: Scrotal wall thickening/edema with associated subcutaneous gas on the right. This appearance suggests infection with a gas-forming organism. Fournier's gangrene is the diagnosis of exclusion. Electronically Signed: By: Julian Hy M.D. On: 04/14/2017 15:33   US Pelvic Doppler (torsion R/o Or Mass Arterial Flow)  Result Date: 04/13/2017 CLINICAL DATA:  Pain and swelling for several days left greater than right EXAM: SCROTAL ULTRASOUND DOPPLER ULTRASOUND OF THE TESTICLES TECHNIQUE: Complete ultrasound examination of the testicles, epididymis, and other scrotal structures was performed. Color and spectral Doppler ultrasound were also utilized to evaluate blood flow to the testicles. COMPARISON:  None. FINDINGS: Right testicle Measurements: 3.5 x 2.3 x 2.5 cm. No mass or microlithiasis visualized. Left testicle Measurements: 3.5 x 2.4 x 2.4 cm. No mass or microlithiasis visualized. Right epididymis:  Normal in size and appearance. Left epididymis:  Prominent with slight increased vascularity. Hydrocele:  Small hydroceles noted bilaterally. Varicocele:  None visualized. Pulsed Doppler interrogation of both testes demonstrates normal low resistance arterial and venous waveforms bilaterally. Note is made of scrotal  thickening bilaterally. IMPRESSION: Prominent epididymis on the left with slight increased vascularity. This may represent a component of epididymitis. Normal-appearing testicles bilaterally. Small hydroceles bilaterally with scrotal skin thickening. Electronically Signed   By: Inez Catalina M.D.   On: 04/13/2017 12:37    Assessment/Plan: [name] is a 56 y.o. male with epididymitis and free air within the scrotum, concerning for Fourniers gangrene.   POD1 s/p scrotal debridement Poorly controlled Type II DM Obesity Multiple Myeloma CKD PAF (on Xarelto)  -Will plan to take the patient back to the OR this afternoon for a dressing change and re-debridement.   -AM labs pending -Continue broad spectrum abx -Will continue to follow     LOS: 1 day   Ellison Hughs, MD 04/15/2017, 10:14 AM  [facility] Pager: [phone]

## 2017-04-16 ENCOUNTER — Other Ambulatory Visit: Payer: Self-pay | Admitting: Urology

## 2017-04-16 ENCOUNTER — Encounter (HOSPITAL_COMMUNITY): Payer: Self-pay | Admitting: Urology

## 2017-04-16 DIAGNOSIS — N179 Acute kidney failure, unspecified: Secondary | ICD-10-CM

## 2017-04-16 DIAGNOSIS — I483 Typical atrial flutter: Secondary | ICD-10-CM

## 2017-04-16 DIAGNOSIS — N5082 Scrotal pain: Secondary | ICD-10-CM

## 2017-04-16 DIAGNOSIS — I10 Essential (primary) hypertension: Secondary | ICD-10-CM

## 2017-04-16 DIAGNOSIS — N492 Inflammatory disorders of scrotum: Secondary | ICD-10-CM

## 2017-04-16 DIAGNOSIS — N183 Chronic kidney disease, stage 3 (moderate): Secondary | ICD-10-CM

## 2017-04-16 DIAGNOSIS — C9000 Multiple myeloma not having achieved remission: Secondary | ICD-10-CM

## 2017-04-16 LAB — CBC WITH DIFFERENTIAL/PLATELET
Basophils Absolute: 0 10*3/uL (ref 0.0–0.1)
Basophils Relative: 0 %
Eosinophils Absolute: 0.6 10*3/uL (ref 0.0–0.7)
Eosinophils Relative: 7 %
HCT: 21.9 % — ABNORMAL LOW (ref 39.0–52.0)
Hemoglobin: 7 g/dL — ABNORMAL LOW (ref 13.0–17.0)
Lymphocytes Relative: 16 %
Lymphs Abs: 1.4 10*3/uL (ref 0.7–4.0)
MCH: 29.3 pg (ref 26.0–34.0)
MCHC: 32 g/dL (ref 30.0–36.0)
MCV: 91.6 fL (ref 78.0–100.0)
Monocytes Absolute: 1.3 10*3/uL — ABNORMAL HIGH (ref 0.1–1.0)
Monocytes Relative: 15 %
Neutro Abs: 5.2 10*3/uL (ref 1.7–7.7)
Neutrophils Relative %: 62 %
Platelets: 134 10*3/uL — ABNORMAL LOW (ref 150–400)
RBC: 2.39 MIL/uL — ABNORMAL LOW (ref 4.22–5.81)
RDW: 17.7 % — ABNORMAL HIGH (ref 11.5–15.5)
WBC: 8.4 10*3/uL (ref 4.0–10.5)

## 2017-04-16 LAB — BASIC METABOLIC PANEL
Anion gap: 4 — ABNORMAL LOW (ref 5–15)
BUN: 33 mg/dL — ABNORMAL HIGH (ref 6–20)
CO2: 25 mmol/L (ref 22–32)
Calcium: 7.7 mg/dL — ABNORMAL LOW (ref 8.9–10.3)
Chloride: 110 mmol/L (ref 101–111)
Creatinine, Ser: 2.18 mg/dL — ABNORMAL HIGH (ref 0.61–1.24)
GFR calc Af Amer: 37 mL/min — ABNORMAL LOW (ref >60)
GFR calc non Af Amer: 32 mL/min — ABNORMAL LOW (ref >60)
Glucose, Bld: 115 mg/dL — ABNORMAL HIGH (ref 65–99)
Potassium: 4.1 mmol/L (ref 3.5–5.1)
Sodium: 139 mmol/L (ref 135–145)

## 2017-04-16 LAB — MAGNESIUM: Magnesium: 1.9 mg/dL (ref 1.7–2.4)

## 2017-04-16 LAB — GLUCOSE, CAPILLARY
Glucose-Capillary: 111 mg/dL — ABNORMAL HIGH (ref 65–99)
Glucose-Capillary: 130 mg/dL — ABNORMAL HIGH (ref 65–99)
Glucose-Capillary: 137 mg/dL — ABNORMAL HIGH (ref 65–99)
Glucose-Capillary: 77 mg/dL (ref 65–99)

## 2017-04-16 MED ORDER — VANCOMYCIN HCL 10 G IV SOLR
1500.0000 mg | INTRAVENOUS | Status: DC
  Administered 2017-04-16: 1500 mg via INTRAVENOUS
  Filled 2017-04-16 (×2): qty 1500

## 2017-04-16 MED ORDER — SODIUM CHLORIDE 0.9 % IV SOLN
Freq: Once | INTRAVENOUS | Status: AC
  Administered 2017-04-17: 03:00:00 via INTRAVENOUS

## 2017-04-16 NOTE — Progress Notes (Signed)
Pharmacy Antibiotic Note  [name] is a 56 y.o. male admitted on 04/13/2017 with scrotal cellulitis and epididymitis with possible Fournier's gangrene. Pharmacy has been consulted for vancomycin dosing.  Day #4 total antibiotics.  Day #2 vancomycin and Zosyn.  SCr showing some improvement.  S/p I&D 11/13.  Planning on re-debridement on 11/16.  Plan: 1) With SCr decreasing, will adjust vancomycin dose to 1500 mg q24h to continue to maintain goal AUC 400-500 as SCr continues to improve. 2) Continue Zosyn 3.375g IV q8h (4 hour infusion time).  3) Will remove Vancomycin and Zosyn pre-op doses in sign and held orders for tomorrow's I&D procedure as duplicate.  Do not recommend doses be given pre-op in addition to maintenance doses, especially in setting of AKI. 4) follow up culture results, clinical course  Height: 6' 2.5" (189.2 cm) Weight: (!) 387 lb 5.6 oz (175.7 kg) IBW/kg (Calculated) : 83.35  Temp (24hrs), Avg:98.7 F (37.1 C), Min:97.8 F (36.6 C), Max:99.8 F (37.7 C)  Recent Labs  Lab 04/13/17 1143 04/14/17 0530 04/15/17 1600 04/16/17 1145  WBC 16.6* 16.4* 9.9 8.4  CREATININE 1.91* 2.36* 2.36* 2.18*    Normalized CrCl = 54mls/min Estimated Creatinine Clearance: 62.8 mL/min (A) (by C-G formula based on SCr of 2.18 mg/dL (H)).    No Known Allergies  Antimicrobials this admission: Cefepime 11/12>> Levaquin 11/12>>11/12 Doxy 11/12 >>11/13 Vanc  11/13 >> Zosyn  11/13 >>  Dose adjustments this admission: 11/14: changed Vanc from 1750mg  IV q24 to 1250mg  IV q24 using AUC calculator - est AUC 428 using SCr 2.36 11/15: changed vanc to 1500 mg q24h for improving SCr to maintain AUC 400-500  Microbiology results: 11/12BCx: ngtd 11/12UCx: pending 11/13 MRSA PCR: negative 11/12 HIV: nonreactive 11/13 tissue cx (scrotum): moderate unidentified organism  Thank you for allowing pharmacy to be a part of this patient's care.  Hershal Coria, PharmD,  BCPS Pager: [phone] 04/16/2017 1:50 PM

## 2017-04-16 NOTE — Progress Notes (Signed)
PROGRESS NOTE    [name]  GTX:[id number] DOB: 1960/08/26 DOA: 04/13/2017 PCP: [name], MD   Brief Narrative:  [name] is a 56 y.o. BM PMHx MI, Chronic Systolic CHF (EF 24-82%), PAF/atrial flutter on (xarelto), HTN, HLD, DM Type II , CKD stage III, chronic diarrhea, Multiple myeloma, OSA on CPAP, previous h/o epidermitis who presented with scrotal pain. Patient states that he was not able to ambulate well due to his recent toe surgery. He was sliding down from his bed and thinks he had some laceration in the posterior wall of his scrotum. He developed swelling and and pain at the scrotum for 3 days. Pain is worse with putting some weight or moving across the bed. He reports previous similar symptoms and diagnosed with epididymitis, treated with antibiotics. He denies fever, chills, chest pains, shortness of breath. In the ED, scrotal ultrasound showed prominent epididymis on the left with slight increased vascularity. This may represent a component of epididymitis. EDP discussed with urology Dr. Pilar Jarvis who agreed for IV antibiotic treatment.     Subjective: 11/15 A/O 4, negative CP, negative SOB. Positive pubic/peritoneal pain. States pain has not decreased from yesterday however does not recall that he was just divided pain medication (wife had to remind him).      Assessment & Plan:   Principal Problem:   Fournier gangrene Active Problems:   Hypertension   Atrial flutter (HCC)   Anemia, normocytic normochromic   Epididymitis   Acute epididymitis   Severe Sepsis secondary to  Fournier's Gangrene of the Scrotum  -11/13 Patient S/P debridement scrotal abscess -11/14 S/P debridement scrotal abscess, with plan for returns for on 11/16 for further debridement. -Blood cultures NGTD -Continue current antibiotic regimen  Diabetes type 2 uncontrolled with complications -50/0 Hemoglobin A1c= 8.2 -Levemir 40 units daily -Sensitive SSI   Acute on CKD stage III  (baseline Cr 1.7 -1.9 )  Recent Labs  Lab 04/13/17 1143 04/14/17 0530 04/15/17 1600  CREATININE 1.91* 2.36* 2.36*  -Worsening renal failure most likely secondary to sepsis, continue monitor closely post surgery  Chronic Systolic CHF (EF 37-04%) -Unknown base weight -Strict in and out since admission +2.0 L -Daily weigh Filed Weights   04/13/17 1018 04/13/17 1613 04/16/17 0500  Weight: (!) 370 lb (167.8 kg) (!) 382 lb 3.2 oz (173.4 kg) (!) 387 lb 5.6 oz (175.7 kg)  -Transfuse for hemoglobin<8 -11/15 transfuse 1 unit. PRBC   HTN -Toprol 25 mg daily    CAD -Lipitor 80 mg daily  Paroxysmal atrial fibrillation -Hold anticoagulation secondary to surgery   Multiple myeloma -Continue decadron, eluxadoline, revlimid, bactrim, acyclovir       DVT prophylaxis: SCD Code Status: Full Family Communication: Wife and brother present at bedside Disposition Plan: TBD   Consultants:  Urology Dr. Conception Oms Winter     Procedures/Significant Events:  11/13 S/P I&D necrotic scrotal skin and drainage of scrotal abscess 11/14 S/P I&D necrotic scrotal skin with packing change. 11/15 transfuse 1 unit. PRBC    I have personally reviewed and interpreted all radiology studies and my findings are as above.  VENTILATOR SETTINGS:    Cultures 11/12 blood NGTD 11/13 MRSA by PCR negative 11/13 culture pending      Antimicrobials: Anti-infectives (From admission, onward)   Start     Stop   04/15/17 1400  vancomycin (VANCOCIN) 1,250 mg in sodium chloride 0.9 % 250 mL IVPB         04/15/17 1300  vancomycin (VANCOCIN) 1,750  mg in sodium chloride 0.9 % 500 mL IVPB  Status:  Discontinued     04/15/17 0911   04/15/17 0900  sulfamethoxazole-trimethoprim (BACTRIM DS,SEPTRA DS) 800-160 MG per tablet 1 tablet         04/14/17 1944  piperacillin-tazobactam (ZOSYN) 3.375 GM/50ML IVPB    Comments:  Williford, Peggy   : cabinet override   04/14/17 2024   04/14/17 1630   clindamycin (CLEOCIN) IVPB 900 mg  Status:  Discontinued     04/14/17 2246   04/14/17 1400  piperacillin-tazobactam (ZOSYN) IVPB 3.375 g         04/14/17 1315  vancomycin (VANCOCIN) IVPB 1000 mg/200 mL premix     04/14/17 1836   04/14/17 1000  acyclovir (ZOVIRAX) tablet 400 mg         04/13/17 2000  ceFEPIme (MAXIPIME) 2 g in dextrose 5 % 50 mL IVPB  Status:  Discontinued     04/14/17 1309   04/13/17 1800  doxycycline (VIBRA-TABS) tablet 100 mg  Status:  Discontinued     04/14/17 1309   04/13/17 1300  levofloxacin (LEVAQUIN) IVPB 500 mg     04/13/17 1507       Devices    LINES / TUBES:      Continuous Infusions: . sodium chloride 100 mL/hr at 04/15/17 1800  . acetaminophen    . piperacillin-tazobactam (ZOSYN)  IV 3.375 g (04/16/17 0528)  . vancomycin Stopped (04/15/17 1700)     Objective: Vitals:   04/16/17 0200 04/16/17 0300 04/16/17 0400 04/16/17 0500  BP: (!) 109/46 123/70 124/70 125/70  Pulse: 84 61 61 70  Resp: (!) '23 15 15 17  ' Temp:  98.1 F (36.7 C)    TempSrc:  Oral    SpO2: 93% 100% 100% 100%  Weight:    (!) 387 lb 5.6 oz (175.7 kg)  Height:        Intake/Output Summary (Last 24 hours) at 04/16/2017 0733 Last data filed at 04/16/2017 0528 Gross per 24 hour  Intake 5238.33 ml  Output 2400 ml  Net 2838.33 ml   Filed Weights   04/13/17 1018 04/13/17 1613 04/16/17 0500  Weight: (!) 370 lb (167.8 kg) (!) 382 lb 3.2 oz (173.4 kg) (!) 387 lb 5.6 oz (175.7 kg)     Physical Exam:  General: A/O 4, No acute respiratory distress, severe pain Neck:  Negative scars, masses, torticollis, lymphadenopathy, JVD Lungs: Clear to auscultation bilaterally without wheezes or crackles Cardiovascular: Tachycardia, Regular rhythm without murmur gallop or rub normal S1 and S2 Abdomen: MORBIDLY OBESE, negative abdominal pain, nondistended, positive soft, bowel sounds, no rebound, no ascites, no appreciable mass. Pain to palpation bilateral inguinal area radiating down  to pubis and scrotum Rt>>Lt. Erythema, warm to touch. Peritoneal area serosanguineous drainage  Extremities: No significant cyanosis, clubbing, or edema bilateral lower extremities Skin: Negative rashes, lesions, ulcers Psychiatric:  Negative depression, negative anxiety, negative fatigue, negative mania  Central nervous system:  Cranial nerves II through XII intact, tongue/uvula midline, all extremities muscle strength 5/5, sensation intact throughout, negative dysarthria, negative expressive aphasia, negative receptive aphasia.  .     Data Reviewed: Care during the described time interval was provided by me .  I have reviewed this patient's available data, including medical history, events of note, physical examination, and all test results as part of my evaluation.   CBC: Recent Labs  Lab 04/13/17 1143 04/14/17 0530 04/15/17 1600  WBC 16.6* 16.4* 9.9  NEUTROABS 14.7*  --  6.7  HGB 9.0* 8.5* 7.4*  HCT 28.9* 26.8* 22.4*  MCV 95.1 94.7 91.1  PLT 157 127* 030*   Basic Metabolic Panel: Recent Labs  Lab 04/13/17 1143 04/14/17 0530 04/15/17 1600  NA 135 134* 137  K 4.0 4.7 4.1  CL 103 103 106  CO2 '23 24 23  ' GLUCOSE 229* 239* 90  BUN 41* 42* 35*  CREATININE 1.91* 2.36* 2.36*  CALCIUM 7.8* 7.8* 7.6*   GFR: Estimated Creatinine Clearance: 58.1 mL/min (A) (by C-G formula based on SCr of 2.36 mg/dL (H)). Liver Function Tests: No results for input(s): AST, ALT, ALKPHOS, BILITOT, PROT, ALBUMIN in the last 168 hours. No results for input(s): LIPASE, AMYLASE in the last 168 hours. No results for input(s): AMMONIA in the last 168 hours. Coagulation Profile: No results for input(s): INR, PROTIME in the last 168 hours. Cardiac Enzymes: No results for input(s): CKTOTAL, CKMB, CKMBINDEX, TROPONINI in the last 168 hours. BNP (last 3 results) No results for input(s): PROBNP in the last 8760 hours. HbA1C: No results for input(s): HGBA1C in the last 72 hours. CBG: Recent Labs  Lab  04/14/17 2152 04/15/17 0824 04/15/17 1305 04/15/17 1753 04/15/17 2101  GLUCAP 167* 116* 102* 82 82   Lipid Profile: No results for input(s): CHOL, HDL, LDLCALC, TRIG, CHOLHDL, LDLDIRECT in the last 72 hours. Thyroid Function Tests: No results for input(s): TSH, T4TOTAL, FREET4, T3FREE, THYROIDAB in the last 72 hours. Anemia Panel: No results for input(s): VITAMINB12, FOLATE, FERRITIN, TIBC, IRON, RETICCTPCT in the last 72 hours. Urine analysis:    Component Value Date/Time   COLORURINE YELLOW 04/13/2017 1118   APPEARANCEUR HAZY (A) 04/13/2017 1118   LABSPEC 1.009 04/13/2017 1118   PHURINE 5.0 04/13/2017 1118   GLUCOSEU NEGATIVE 04/13/2017 1118   HGBUR SMALL (A) 04/13/2017 1118   BILIRUBINUR NEGATIVE 04/13/2017 1118   KETONESUR NEGATIVE 04/13/2017 1118   PROTEINUR 100 (A) 04/13/2017 1118   UROBILINOGEN 0.2 10/03/2012 1809   NITRITE NEGATIVE 04/13/2017 1118   LEUKOCYTESUR MODERATE (A) 04/13/2017 1118   Sepsis Labs: '@LABRCNTIP' (procalcitonin:4,lacticidven:4)  ) Recent Results (from the past 240 hour(s))  Culture, blood (routine x 2)     Status: None (Preliminary result)   Collection Time: 04/13/17  6:17 PM  Result Value Ref Range Status   Specimen Description BLOOD LEFT HAND  Final   Special Requests   Final    BOTTLES DRAWN AEROBIC AND ANAEROBIC Blood Culture adequate volume   Culture NO GROWTH 2 DAYS  Final   Report Status PENDING  Incomplete  Culture, blood (routine x 2)     Status: None (Preliminary result)   Collection Time: 04/13/17  6:17 PM  Result Value Ref Range Status   Specimen Description LEFT ANTECUBITAL  Final   Special Requests   Final    BOTTLES DRAWN AEROBIC AND ANAEROBIC Blood Culture adequate volume   Culture NO GROWTH 2 DAYS  Final   Report Status PENDING  Incomplete  MRSA PCR Screening     Status: None   Collection Time: 04/14/17  6:50 PM  Result Value Ref Range Status   MRSA by PCR NEGATIVE NEGATIVE Final    Comment:        The GeneXpert MRSA  Assay (FDA approved for NASAL specimens only), is one component of a comprehensive MRSA colonization surveillance program. It is not intended to diagnose MRSA infection nor to guide or monitor treatment for MRSA infections.          Radiology Studies: Ct Abdomen Pelvis W Contrast  Addendum Date: 04/14/2017   ADDENDUM REPORT: 04/14/2017 15:53 ADDENDUM: These results were called by telephone at the time of interpretation on 04/14/2017 at 3:35 pm to Dr. Dessa Phi , who verbally acknowledged these results. Electronically Signed   By: Julian Hy M.D.   On: 04/14/2017 15:53   Result Date: 04/14/2017 CLINICAL DATA:  Severe scrotal swelling, scrotal pain, sepsis, epididymitis, diabetic, evaluate for Fournier's gangrene EXAM: CT ABDOMEN AND PELVIS WITH CONTRAST TECHNIQUE: Multidetector CT imaging of the abdomen and pelvis was performed using the standard protocol following bolus administration of intravenous contrast. CONTRAST:  11m ISOVUE-300 IOPAMIDOL (ISOVUE-300) INJECTION 61% COMPARISON:  None. FINDINGS: Motion degraded images. Lower chest: Eventration of the left hemidiaphragm. Hepatobiliary: Geographic hepatic steatosis. Layering 4 mm gallstone (series 2/ image 29), without associated inflammatory changes. No intrahepatic or extrahepatic ductal dilatation. Pancreas: Within normal limits. Spleen: Within normal limits. Adrenals/Urinary Tract: Adrenal glands are within normal limits. Bilateral lower pole nonobstructing renal calculi measuring 3 mm (series 2/ images 4445). Mildly heterogeneous enhancement on the right. No hydronephrosis. Bladder is within normal limits. Stomach/Bowel: Stomach is within normal limits. No evidence of bowel obstruction. Normal appendix (series 2/image 71). Vascular/Lymphatic: No evidence of abdominal aortic aneurysm. Atherosclerotic calcifications of the abdominal aorta and branch vessels. No suspicious abdominopelvic lymphadenopathy. Small right inguinal  nodes measuring up to 15 mm on the right (series 2/ image 98), likely reactive. Reproductive: Prostate is grossly unremarkable. Scrotal wall thickening/ edema with associated subcutaneous gas on the right (series 3/image 16). Other: No abdominopelvic ascites. Tiny fat containing periumbilical hernia. Subcutaneous injection sites along the anterior abdominal wall. Musculoskeletal: Degenerative changes of the visualized thoracolumbar spine. IMPRESSION: Scrotal wall thickening/edema with associated subcutaneous gas on the right. This appearance suggests infection with a gas-forming organism. Fournier's gangrene is the diagnosis of exclusion. Electronically Signed: By: SJulian HyM.D. On: 04/14/2017 15:33        Scheduled Meds: . acyclovir  400 mg Oral q morning - 10a  . allopurinol  300 mg Oral Daily  . atorvastatin  80 mg Oral QHS  . calcitRIOL  0.25 mcg Oral Once per day on Mon Wed Fri  . Chlorhexidine Gluconate Cloth  6 each Topical Daily  . colchicine  0.6 mg Oral Daily  . [START ON 04/17/2017] dexamethasone  10 mg Oral Q Fri-1800  . Eluxadoline  100 mg Oral Daily  . fentaNYL      . fentaNYL      . gabapentin  300 mg Oral QHS  . HYDROmorphone      . insulin aspart  0-9 Units Subcutaneous TID WC  . insulin detemir  40 Units Subcutaneous q morning - 10a  . [START ON 04/19/2017] lenalidomide  5 mg Oral Daily  . mouth rinse  15 mL Mouth Rinse BID  . metoprolol succinate  25 mg Oral Daily  . multivitamin with minerals  1 tablet Oral Daily  . oxyCODONE      . pantoprazole  40 mg Oral Daily  . sulfamethoxazole-trimethoprim  1 tablet Oral Once per day on Mon Wed Fri  . torsemide  40 mg Oral Daily  . [START ON 04/20/2017] Vitamin D (Ergocalciferol)  50,000 Units Oral Q7 days   Continuous Infusions: . sodium chloride 100 mL/hr at 04/15/17 1800  . acetaminophen    . piperacillin-tazobactam (ZOSYN)  IV 3.375 g (04/16/17 0528)  . vancomycin Stopped (04/15/17 1700)     LOS: 2 days     Time spent: 40 minutes    Tennelle Taflinger,  Geraldo Docker, MD [facility] Pager [phone]   If 7PM-7AM, please contact night-coverage [email or web address] Password [facility] 04/16/2017, 7:33 AM

## 2017-04-16 NOTE — Progress Notes (Signed)
PHARMACIST - PHYSICIAN COMMUNICATION CONCERNING:  Oral Chemotherapy  Order for [name] noted to start 11/18 for 7 day on cycle will be place on hold due to [employer] approved oral chemotherapy policy since patient has an active infection.  Per policy, if fever, other signs of infection, or any of the medication-specific conditions in the checklist below are identified, the medication will be held until physician review occurs as described in the oral chemotherapy policy.  For reference, additional hold criteria for [name] is listed below.  Lenalidomide ([name]) hold criteria  ANC < 1  Pltc < 50K  AST or ALT > 3x ULN  Bili > 1.5x ULN  Acute venous thromboembolic event  Thank you.  Hershal Coria, PharmD, BCPS 04/16/2017 2:06 PM

## 2017-04-16 NOTE — Progress Notes (Signed)
1 Day Post-Op Subjective: No acute events overnight.  Pain controlled with dilaudid and oxycodone.    Objective: Vital signs in last 24 hours: Temp:  [98.1 F (36.7 C)-99.8 F (37.7 C)] 98.1 F (36.7 C) (11/15 0300) Pulse Rate:  [61-103] 70 (11/15 0500) Resp:  [12-27] 17 (11/15 0500) BP: (92-160)/(35-123) 125/70 (11/15 0500) SpO2:  [90 %-100 %] 100 % (11/15 0500) Weight:  [175.7 kg (387 lb 5.6 oz)] 175.7 kg (387 lb 5.6 oz) (11/15 0500)  Intake/Output from previous day: 11/14 0701 - 11/15 0700 In: 5238.3 [P.O.:220; I.V.:4518.3; IV Piggyback:500] Out: 2400 [Urine:2300; Blood:100]  Intake/Output this shift: No intake/output data recorded.  Physical Exam:  General: Alert and oriented CV: RRR, palpable distal pulses Lungs: CTAB, equal chest rise Abdomen: Soft, NTND, no rebound or guarding Incisions: Scrotal dressings are slightly blood tinged and intact Ext: NT, No erythema  Lab Results: Recent Labs    04/13/17 1143 04/14/17 0530 04/15/17 1600  HGB 9.0* 8.5* 7.4*  HCT 28.9* 26.8* 22.4*   BMET Recent Labs    04/14/17 0530 04/15/17 1600  NA 134* 137  K 4.7 4.1  CL 103 106  CO2 24 23  GLUCOSE 239* 90  BUN 42* 35*  CREATININE 2.36* 2.36*  CALCIUM 7.8* 7.6*     Studies/Results: Ct Abdomen Pelvis W Contrast  Addendum Date: 04/14/2017   ADDENDUM REPORT: 04/14/2017 15:53 ADDENDUM: These results were called by telephone at the time of interpretation on 04/14/2017 at 3:35 pm to Dr. Dessa Phi , who verbally acknowledged these results. Electronically Signed   By: Julian Hy M.D.   On: 04/14/2017 15:53   Result Date: 04/14/2017 CLINICAL DATA:  Severe scrotal swelling, scrotal pain, sepsis, epididymitis, diabetic, evaluate for Fournier's gangrene EXAM: CT ABDOMEN AND PELVIS WITH CONTRAST TECHNIQUE: Multidetector CT imaging of the abdomen and pelvis was performed using the standard protocol following bolus administration of intravenous contrast. CONTRAST:  116m  ISOVUE-300 IOPAMIDOL (ISOVUE-300) INJECTION 61% COMPARISON:  None. FINDINGS: Motion degraded images. Lower chest: Eventration of the left hemidiaphragm. Hepatobiliary: Geographic hepatic steatosis. Layering 4 mm gallstone (series 2/ image 29), without associated inflammatory changes. No intrahepatic or extrahepatic ductal dilatation. Pancreas: Within normal limits. Spleen: Within normal limits. Adrenals/Urinary Tract: Adrenal glands are within normal limits. Bilateral lower pole nonobstructing renal calculi measuring 3 mm (series 2/ images 4445). Mildly heterogeneous enhancement on the right. No hydronephrosis. Bladder is within normal limits. Stomach/Bowel: Stomach is within normal limits. No evidence of bowel obstruction. Normal appendix (series 2/image 71). Vascular/Lymphatic: No evidence of abdominal aortic aneurysm. Atherosclerotic calcifications of the abdominal aorta and branch vessels. No suspicious abdominopelvic lymphadenopathy. Small right inguinal nodes measuring up to 15 mm on the right (series 2/ image 98), likely reactive. Reproductive: Prostate is grossly unremarkable. Scrotal wall thickening/ edema with associated subcutaneous gas on the right (series 3/image 16). Other: No abdominopelvic ascites. Tiny fat containing periumbilical hernia. Subcutaneous injection sites along the anterior abdominal wall. Musculoskeletal: Degenerative changes of the visualized thoracolumbar spine. IMPRESSION: Scrotal wall thickening/edema with associated subcutaneous gas on the right. This appearance suggests infection with a gas-forming organism. Fournier's gangrene is the diagnosis of exclusion. Electronically Signed: By: SJulian HyM.D. On: 04/14/2017 15:33    Assessment/Plan:  Franklin HiginbothamMcLaurinis a 56y.o.malewith epididymitis and free air within the scrotum, concerning for Fourniers gangrene.   POD 2 s/p scrotal debridement S/p dressing change on 04/15/17 Poorly controlled Type II  DM Obesity Multiple Myeloma Chronic Anemia CKD PAF (on Xarelto)  -AM labs pending.  If H/H continues to drop, recommend transfusion -Continue broad spectrum abx -Will plan on redebridement on 04/17/17   LOS: 2 days   Ellison Hughs, MD 04/16/2017, 8:43 AM  [facility] Pager: [phone]

## 2017-04-16 NOTE — Clinical Social Work Note (Addendum)
Clinical Social Work Assessment  Patient Details  Name: [name] MRN: [id number] Date of Birth: 03-Jan-1961  Date of referral:  04/16/17               Reason for consult:  Facility Placement                Permission sought to share information with:  Family Supports Permission granted to share information::  Yes, Verbal Permission Granted  Name::        Agency::     Relationship::  Spouse   Contact Information:     Housing/Transportation Living arrangements for the past 2 months:  Single Family Home Source of Information:  Spouse, Patient Patient Interpreter Needed:  None Criminal Activity/Legal Involvement Pertinent to Current Situation/Hospitalization:  No - Comment as needed Significant Relationships:  Spouse Lives with:  Spouse Do you feel safe going back to the place where you live?  Yes Need for family participation in patient care:  Yes (Comment)  Care giving concerns:  Patient had recent toe surgery and has not been able to ambulate well at home. Patient spouse states patient was using a cane to ambulate prior to surgery.   She reports developed swelling and pain and went to their PCP, she reports they where then to sent to the hospital. Patient admitted for scrotal pain.   She reports the patient will need more debridement and has is receiving IV fluids. She reports the physician has recommended the patient complete rehab and receive nursing care before returning home.   Social Worker assessment / plan:  CSW met with the patient and spouse at bedside, explain role and reason for visit- to assist with disposition, SNF. Both patient and spouse are agreeable to SNF plan at discharge. Spouse reports she worried she will not be able to manage the wound care the patient will need.  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  CSW provided emotional support and explain  SNF/ BCBS authorization process. CSW provided a blank SNF list for spouse to reference. Patient prefers a facility in the [place] area where they live.     Plan: Assist with disposition to SNF.  CSW will continue to follow.   Employment status:  Animator, Cytogeneticist information:  Nurse, mental health) PT Recommendations:  [place] / Referral to community resources:  [place]  Patient/Family's Response to care:  Agreeable and Responding to care. Patient/Spouse appreciative of CSW services.   Patient/Family's Understanding of and Emotional Response to Diagnosis, Current Treatment, and Prognosis: Patient spouse at bedside, "I am learning about the care he will need." Patient spouse reports asking questions about patient needs/ abilities so far in his course of treatment.    Emotional Assessment Appearance:  Appears stated age Attitude/Demeanor/Rapport:    Affect (typically observed):  Accepting, Calm Orientation:  Oriented to Self, Oriented to Place, Oriented to  Time, Oriented to Situation Alcohol / Substance use:  Not Applicable Psych involvement (Current and /or in the community):  No (Comment)  Discharge Needs  Concerns to be addressed:    Readmission within the last 30 days:  No Current discharge risk:  Dependent with Mobility Barriers to Discharge:  Continued Medical Work up   [employer], LCSW 04/16/2017, 2:07 PM

## 2017-04-17 ENCOUNTER — Inpatient Hospital Stay (HOSPITAL_COMMUNITY): Payer: BLUE CROSS/BLUE SHIELD | Admitting: Certified Registered Nurse Anesthetist

## 2017-04-17 ENCOUNTER — Encounter (HOSPITAL_COMMUNITY)
Admission: EM | Disposition: A | Discharge status: Skilled Nursing Facility | Payer: Self-pay | Source: Home / Self Care | Attending: Internal Medicine

## 2017-04-17 DIAGNOSIS — T383X5A Adverse effect of insulin and oral hypoglycemic [antidiabetic] drugs, initial encounter: Secondary | ICD-10-CM

## 2017-04-17 DIAGNOSIS — E160 Drug-induced hypoglycemia without coma: Secondary | ICD-10-CM

## 2017-04-17 HISTORY — PX: IRRIGATION AND DEBRIDEMENT ABSCESS: SHX5252

## 2017-04-17 LAB — CBC
HCT: 22.6 % — ABNORMAL LOW (ref 39.0–52.0)
Hemoglobin: 7.5 g/dL — ABNORMAL LOW (ref 13.0–17.0)
MCH: 30.1 pg (ref 26.0–34.0)
MCHC: 33.2 g/dL (ref 30.0–36.0)
MCV: 90.8 fL (ref 78.0–100.0)
Platelets: 140 10*3/uL — ABNORMAL LOW (ref 150–400)
RBC: 2.49 MIL/uL — ABNORMAL LOW (ref 4.22–5.81)
RDW: 17.2 % — ABNORMAL HIGH (ref 11.5–15.5)
WBC: 9.1 10*3/uL (ref 4.0–10.5)

## 2017-04-17 LAB — BASIC METABOLIC PANEL
Anion gap: 8 (ref 5–15)
BUN: 28 mg/dL — ABNORMAL HIGH (ref 6–20)
CO2: 24 mmol/L (ref 22–32)
Calcium: 7.9 mg/dL — ABNORMAL LOW (ref 8.9–10.3)
Chloride: 110 mmol/L (ref 101–111)
Creatinine, Ser: 1.99 mg/dL — ABNORMAL HIGH (ref 0.61–1.24)
GFR calc Af Amer: 41 mL/min — ABNORMAL LOW (ref >60)
GFR calc non Af Amer: 35 mL/min — ABNORMAL LOW (ref >60)
Glucose, Bld: 74 mg/dL (ref 65–99)
Potassium: 4 mmol/L (ref 3.5–5.1)
Sodium: 142 mmol/L (ref 135–145)

## 2017-04-17 LAB — PREPARE RBC (CROSSMATCH)

## 2017-04-17 LAB — ABO/RH: ABO/RH(D): O POS

## 2017-04-17 LAB — GLUCOSE, CAPILLARY
Glucose-Capillary: 48 mg/dL — ABNORMAL LOW (ref 65–99)
Glucose-Capillary: 78 mg/dL (ref 65–99)
Glucose-Capillary: 74 mg/dL (ref 65–99)
Glucose-Capillary: 73 mg/dL (ref 65–99)
Glucose-Capillary: 45 mg/dL — ABNORMAL LOW (ref 65–99)
Glucose-Capillary: 89 mg/dL (ref 65–99)
Glucose-Capillary: 101 mg/dL — ABNORMAL HIGH (ref 65–99)

## 2017-04-17 LAB — MAGNESIUM: Magnesium: 1.9 mg/dL (ref 1.7–2.4)

## 2017-04-17 SURGERY — IRRIGATION AND DEBRIDEMENT ABSCESS
Anesthesia: General | Site: Scrotum

## 2017-04-17 MED ORDER — HYDROMORPHONE HCL 1 MG/ML IJ SOLN
INTRAMUSCULAR | Status: AC
  Administered 2017-04-17: 0.5 mg via INTRAVENOUS
  Filled 2017-04-17: qty 2

## 2017-04-17 MED ORDER — ONDANSETRON HCL 4 MG/2ML IJ SOLN
INTRAMUSCULAR | Status: DC | PRN
  Administered 2017-04-17: 4 mg via INTRAVENOUS

## 2017-04-17 MED ORDER — DEXTROSE 50 % IV SOLN
INTRAVENOUS | Status: DC | PRN
  Administered 2017-04-17: 12.5 g via INTRAVENOUS

## 2017-04-17 MED ORDER — DEXTROSE 50 % IV SOLN
INTRAVENOUS | Status: AC
  Administered 2017-04-17: 25 mL via INTRAVENOUS
  Filled 2017-04-17: qty 50

## 2017-04-17 MED ORDER — FENTANYL CITRATE (PF) 100 MCG/2ML IJ SOLN
INTRAMUSCULAR | Status: DC | PRN
  Administered 2017-04-17 (×2): 50 ug via INTRAVENOUS
  Administered 2017-04-17: 100 ug via INTRAVENOUS
  Administered 2017-04-17: 50 ug via INTRAVENOUS

## 2017-04-17 MED ORDER — KCL IN DEXTROSE-NACL 10-5-0.45 MEQ/L-%-% IV SOLN
INTRAVENOUS | Status: DC
  Administered 2017-04-17: 09:00:00 via INTRAVENOUS
  Administered 2017-04-18: 1000 mL via INTRAVENOUS
  Filled 2017-04-17 (×2): qty 1000

## 2017-04-17 MED ORDER — PROPOFOL 10 MG/ML IV BOLUS
INTRAVENOUS | Status: AC
  Filled 2017-04-17: qty 20

## 2017-04-17 MED ORDER — ONDANSETRON HCL 4 MG/2ML IJ SOLN: 4.0000 mg | Freq: Once | INTRAMUSCULAR | Status: DC | PRN

## 2017-04-17 MED ORDER — INSULIN DETEMIR 100 UNIT/ML ~~LOC~~ SOLN
30.0000 [IU] | Freq: Every morning | SUBCUTANEOUS | Status: DC
  Administered 2017-04-18 – 2017-04-20 (×3): 30 [IU] via SUBCUTANEOUS
  Filled 2017-04-17 (×4): qty 0.3

## 2017-04-17 MED ORDER — MIDAZOLAM HCL 2 MG/2ML IJ SOLN
INTRAMUSCULAR | Status: AC
  Filled 2017-04-17: qty 2

## 2017-04-17 MED ORDER — DEXTROSE 50 % IV SOLN
INTRAVENOUS | Status: AC
  Filled 2017-04-17: qty 50

## 2017-04-17 MED ORDER — LACTATED RINGERS IV SOLN
INTRAVENOUS | Status: DC
  Administered 2017-04-17: 17:00:00 via INTRAVENOUS

## 2017-04-17 MED ORDER — PROPOFOL 10 MG/ML IV BOLUS
INTRAVENOUS | Status: DC | PRN
  Administered 2017-04-17: 140 mg via INTRAVENOUS

## 2017-04-17 MED ORDER — MIDAZOLAM HCL 5 MG/5ML IJ SOLN
INTRAMUSCULAR | Status: DC | PRN
  Administered 2017-04-17: 2 mg via INTRAVENOUS

## 2017-04-17 MED ORDER — FENTANYL CITRATE (PF) 250 MCG/5ML IJ SOLN
INTRAMUSCULAR | Status: AC
  Filled 2017-04-17: qty 5

## 2017-04-17 MED ORDER — 0.9 % SODIUM CHLORIDE (POUR BTL) OPTIME
TOPICAL | Status: DC | PRN
  Administered 2017-04-17: 1000 mL

## 2017-04-17 MED ORDER — POVIDONE-IODINE 10 % EX OINT
TOPICAL_OINTMENT | Freq: Every day | CUTANEOUS | Status: DC
  Administered 2017-04-17 – 2017-04-22 (×6): via TOPICAL
  Filled 2017-04-17: qty 28.35

## 2017-04-17 MED ORDER — DEXTROSE 50 % IV SOLN
25.0000 mL | Freq: Once | INTRAVENOUS | Status: AC
  Administered 2017-04-17: 25 mL via INTRAVENOUS

## 2017-04-17 MED ORDER — SUCCINYLCHOLINE CHLORIDE 20 MG/ML IJ SOLN
INTRAMUSCULAR | Status: DC | PRN
  Administered 2017-04-17: 140 mg via INTRAVENOUS

## 2017-04-17 MED ORDER — LACTATED RINGERS IV SOLN
INTRAVENOUS | Status: DC | PRN
  Administered 2017-04-17: 17:00:00 via INTRAVENOUS

## 2017-04-17 MED ORDER — HYDROMORPHONE HCL 1 MG/ML IJ SOLN
0.2500 mg | INTRAMUSCULAR | Status: DC | PRN
Start: 2017-04-17 — End: 2017-04-17
  Administered 2017-04-17 (×2): 0.5 mg via INTRAVENOUS

## 2017-04-17 MED ORDER — ONDANSETRON HCL 4 MG/2ML IJ SOLN
INTRAMUSCULAR | Status: AC
  Filled 2017-04-17: qty 2

## 2017-04-17 MED ORDER — MEPERIDINE HCL 50 MG/ML IJ SOLN: 6.2500 mg | INTRAMUSCULAR | Status: DC | PRN

## 2017-04-17 SURGICAL SUPPLY — 47 items
APL SKNCLS STERI-STRIP NONHPOA (GAUZE/BANDAGES/DRESSINGS)
BENZOIN TINCTURE PRP APPL 2/3 (GAUZE/BANDAGES/DRESSINGS) IMPLANT
BLADE HEX COATED 2.75 (ELECTRODE) ×2 IMPLANT
BLADE SURG 15 STRL LF DISP TIS (BLADE) ×1 IMPLANT
BLADE SURG 15 STRL SS (BLADE) ×2
BLADE SURG SZ10 CARB STEEL (BLADE) ×4 IMPLANT
BRIEF STRETCH FOR OB PAD LRG (UNDERPADS AND DIAPERS) ×1 IMPLANT
COVER SURGICAL LIGHT HANDLE (MISCELLANEOUS) ×2 IMPLANT
DECANTER SPIKE VIAL GLASS SM (MISCELLANEOUS) IMPLANT
DRAIN PENROSE 18X1/2 LTX STRL (DRAIN) ×4 IMPLANT
DRAPE LAPAROSCOPIC ABDOMINAL (DRAPES) IMPLANT
DRAPE LAPAROTOMY T 102X78X121 (DRAPES) IMPLANT
DRAPE LAPAROTOMY TRNSV 102X78 (DRAPE) IMPLANT
DRAPE LEGGING IMPERMEABLE (DRAPES) ×2 IMPLANT
DRAPE POUCH INSTRU U-SHP 10X18 (DRAPES) IMPLANT
DRAPE SHEET LG 3/4 BI-LAMINATE (DRAPES) ×4 IMPLANT
DRSG TELFA 3X8 NADH (GAUZE/BANDAGES/DRESSINGS) ×2 IMPLANT
ELECT PENCIL ROCKER SW 15FT (MISCELLANEOUS) ×2 IMPLANT
ELECT REM PT RETURN 15FT ADLT (MISCELLANEOUS) ×2 IMPLANT
EVACUATOR SILICONE 100CC (DRAIN) IMPLANT
GAUZE SPONGE 4X4 12PLY STRL (GAUZE/BANDAGES/DRESSINGS) ×2 IMPLANT
GLOVE BIO SURGEON STRL SZ7 (GLOVE) ×4 IMPLANT
GLOVE BIOGEL PI IND STRL 6.5 (GLOVE) ×1 IMPLANT
GLOVE BIOGEL PI IND STRL 7.0 (GLOVE) ×2 IMPLANT
GLOVE BIOGEL PI INDICATOR 6.5 (GLOVE) ×1
GLOVE BIOGEL PI INDICATOR 7.0 (GLOVE) ×2
GLOVE EUDERMIC 7 POWDERFREE (GLOVE) IMPLANT
GOWN STRL REUS W/TWL LRG LVL3 (GOWN DISPOSABLE) ×6 IMPLANT
GOWN STRL REUS W/TWL XL LVL3 (GOWN DISPOSABLE) ×4 IMPLANT
KIT BASIN OR (CUSTOM PROCEDURE TRAY) ×2 IMPLANT
NEEDLE HYPO 25X1 1.5 SAFETY (NEEDLE) IMPLANT
NS IRRIG 1000ML POUR BTL (IV SOLUTION) ×4 IMPLANT
PACK BASIC VI WITH GOWN DISP (CUSTOM PROCEDURE TRAY) ×2 IMPLANT
PAD ABD 8X10 STRL (GAUZE/BANDAGES/DRESSINGS) ×1 IMPLANT
SOL PREP POV-IOD 4OZ 10% (MISCELLANEOUS) ×4 IMPLANT
SPONGE LAP 18X18 X RAY DECT (DISPOSABLE) ×4 IMPLANT
SPONGE LAP 4X18 X RAY DECT (DISPOSABLE) IMPLANT
STAPLER VISISTAT 35W (STAPLE) IMPLANT
STRIP CLOSURE SKIN 1/2X4 (GAUZE/BANDAGES/DRESSINGS) IMPLANT
SUT ETHILON 2 0 PS N (SUTURE) ×2 IMPLANT
SUT MNCRL AB 4-0 PS2 18 (SUTURE) IMPLANT
SUT NYLON 10 0 BLK MONFIL (SUTURE) ×2 IMPLANT
SUT VIC AB 3-0 SH 18 (SUTURE) IMPLANT
SYR CONTROL 10ML LL (SYRINGE) IMPLANT
TOWEL OR 17X26 10 PK STRL BLUE (TOWEL DISPOSABLE) ×2 IMPLANT
WATER STERILE IRR 1000ML POUR (IV SOLUTION) IMPLANT
YANKAUER SUCT BULB TIP 10FT TU (MISCELLANEOUS) ×2 IMPLANT

## 2017-04-17 NOTE — Progress Notes (Signed)
PROGRESS NOTE  [name]  UXL:[id number] DOB: 17-Dec-1960 DOA: 04/13/2017 PCP: [name], MD  Brief Narrative:   56 year old male with history of coronary artery disease, chronic systolic heart failure with ejection fraction 35-40%, paroxysmal atrial flutter on Xarelto, CKD stage III, chronic diarrhea, multiple myeloma, OSA on CPAP, and previous history of epididymitis who presented with scrotal pain.  He had a laceration to the posterior wall of the scrotum while sliding down from his bed a few days prior to admission.  He developed worsening pain without fever, chills.  Ultrasound in the emergency department and demonstrated a prominent epididymis on the left.  The patient was also found to have subcutaneous air on exam.  Urology was consulted and the patient was started on IV antibiotics.  He was transferred from [facility] due to the concern of Fournier's gangrene and he underwent his first debridement on 11/13.  He had a second debridement on 11/14 and is awaiting a third debridement on 11/16.  Initial cultures grew mixed flora and he has so far speciated enterococcus avium which is pansensitive.  His antibiotics will be narrowed to Zosyn accordingly.  His MRSA PCR was negative.  Assessment & Plan:   Principal Problem:   Fournier gangrene Active Problems:   Hypertension   Atrial flutter (HCC)   Anemia, normocytic normochromic   Epididymitis   Acute epididymitis  Severe Sepsis secondary to Fournier's Gangrene of the Scrotum  -11/13 Debridement scrotal abscess -11/14 Second debridement scrotal abscess - 11/16 Third debridement. - Blood cultures NGTD - OR cultures with gram-positive cocci and gram-negative rods, enterococcus avium -Discontinue vancomycin -Continue Zosyn  Diabetes type 2 uncontrolled with complications, with hypoglycemia -11/1 Hemoglobin A1c= 8.2 - Decrease Levemir to 30 units (already received this morning's dose) but also getting a dose of  dexamethasone this morning -  Start dextrose containing IVF - Continue Sensitive SSI  Acute on CKD stage III (baseline Cr 1.7 -1.9 ) - Creatinine back to baseline  Chronic Systolic CHF (EF 53-66%) -Wt trending down slightly -11/15 transfuse 1 unit. PRBC  HTN -Toprol 25 mg daily   CAD -Lipitor 80 mg daily  Paroxysmal atrial fibrillation -Holding anticoagulation secondary to surgery  Multiple myeloma -Continue decadron, eluxadoline, revlimid, bactrim, acyclovir   DVT prophylaxis:  SCDs Code Status:  Full code Family Communication:  Patient and his wife at bedside Disposition Plan:  Ongoing debridements as needed per Urology.  Continue IV antibiotics.     Consultants:  Urology Dr. Conception Oms Winter   Procedures:  11/13 S/P I&D necrotic scrotal skin and drainage of scrotal abscess 11/14 S/P I&D necrotic scrotal skin with packing change. 11/15 transfuse 1 unit. PRBC  Antimicrobials:  Anti-infectives (From admission, onward)   Start     Dose/Rate Route Frequency Ordered Stop   04/16/17 1500  vancomycin (VANCOCIN) 1,500 mg in sodium chloride 0.9 % 500 mL IVPB  Status:  Discontinued     1,500 mg 250 mL/hr over 120 Minutes Intravenous Every 24 hours 04/16/17 1356 04/17/17 1343   04/15/17 1400  vancomycin (VANCOCIN) 1,250 mg in sodium chloride 0.9 % 250 mL IVPB  Status:  Discontinued     1,250 mg 166.7 mL/hr over 90 Minutes Intravenous Every 24 hours 04/15/17 0911 04/16/17 1356   04/15/17 1300  vancomycin (VANCOCIN) 1,750 mg in sodium chloride 0.9 % 500 mL IVPB  Status:  Discontinued     1,750 mg 250 mL/hr over 120 Minutes Intravenous Every 24 hours 04/14/17 1319 04/15/17 0911  04/15/17 0900  sulfamethoxazole-trimethoprim (BACTRIM DS,SEPTRA DS) 800-160 MG per tablet 1 tablet     1 tablet Oral Once per day on Mon Wed Fri 04/13/17 1412     04/14/17 1944  piperacillin-tazobactam (ZOSYN) 3.375 GM/50ML IVPB    Comments:  Williford, Peggy   : cabinet override       04/14/17 1944 04/14/17 2024   04/14/17 1630  clindamycin (CLEOCIN) IVPB 900 mg  Status:  Discontinued     900 mg 100 mL/hr over 30 Minutes Intravenous Every 8 hours 04/14/17 1611 04/14/17 2246   04/14/17 1400  piperacillin-tazobactam (ZOSYN) IVPB 3.375 g     3.375 g 12.5 mL/hr over 240 Minutes Intravenous Every 8 hours 04/14/17 1309     04/14/17 1315  vancomycin (VANCOCIN) IVPB 1000 mg/200 mL premix     1,000 mg 200 mL/hr over 60 Minutes Intravenous Every 1 hr x 2 04/14/17 1313 04/14/17 1836   04/14/17 1000  acyclovir (ZOVIRAX) tablet 400 mg     400 mg Oral  Every morning - 10a 04/13/17 1412     04/13/17 2000  ceFEPIme (MAXIPIME) 2 g in dextrose 5 % 50 mL IVPB  Status:  Discontinued     2 g 100 mL/hr over 30 Minutes Intravenous Every 24 hours 04/13/17 1716 04/14/17 1309   04/13/17 1800  doxycycline (VIBRA-TABS) tablet 100 mg  Status:  Discontinued     100 mg Oral Every 12 hours 04/13/17 1648 04/14/17 1309   04/13/17 1300  levofloxacin (LEVAQUIN) IVPB 500 mg     500 mg 100 mL/hr over 60 Minutes Intravenous  Once 04/13/17 1255 04/13/17 1507       Subjective:  Having increased pain in the right lower quadrant, ongoing pain in the scrotum.  Denies fevers, chills, nausea, chest pains, shortness of breath  Objective: Vitals:   04/17/17 0600 04/17/17 0700 04/17/17 0800 04/17/17 1200  BP: (!) 102/54 (!) 106/51    Pulse: (!) 59 60    Resp: 12 18    Temp:   98 F (36.7 C) 98.7 F (37.1 C)  TempSrc:   Oral Oral  SpO2: 96% 92%    Weight:      Height:        Intake/Output Summary (Last 24 hours) at 04/17/2017 1432 Last data filed at 04/17/2017 1400 Gross per 24 hour  Intake 3880 ml  Output 2475 ml  Net 1405 ml   Filed Weights   04/13/17 1613 04/16/17 0500 04/17/17 0500  Weight: (!) 173.4 kg (382 lb 3.2 oz) (!) 175.7 kg (387 lb 5.6 oz) (!) 173.7 kg (382 lb 15 oz)    Examination:  General exam:  Adult male.  No acute distress.  HEENT:  NCAT, MMM Respiratory system: Clear  to auscultation bilaterally Cardiovascular system: Regular rate and rhythm, normal S1/S2. No murmurs, rubs, gallops or clicks.  Warm extremities Gastrointestinal system: Normal active bowel sounds, soft, nondistended, tender to palpation in the right lower quadrant without rebound or guarding  MSK:  Normal tone and bulk, no lower extremity edema Neuro:  Grossly intact GU: Significant induration of the lower pannus, pubic area, right scrotal area.  Dressing not removed from the right scrotum.  Very tender to palpation    Data Reviewed: I have personally reviewed following labs and imaging studies  CBC: Recent Labs  Lab 04/13/17 1143 04/14/17 0530 04/15/17 1600 04/16/17 1145 04/17/17 1045  WBC 16.6* 16.4* 9.9 8.4 9.1  NEUTROABS 14.7*  --  6.7 5.2  --  HGB 9.0* 8.5* 7.4* 7.0* 7.5*  HCT 28.9* 26.8* 22.4* 21.9* 22.6*  MCV 95.1 94.7 91.1 91.6 90.8  PLT 157 127* 132* 134* 003*   Basic Metabolic Panel: Recent Labs  Lab 04/13/17 1143 04/14/17 0530 04/15/17 1600 04/16/17 1145 04/17/17 1045  NA 135 134* 137 139 142  K 4.0 4.7 4.1 4.1 4.0  CL 103 103 106 110 110  CO2 _0 GLUCOSE 229* 239* 90 115* 74  BUN 41* 42* 35* 33* 28*  CREATININE 1.91* 2.36* 2.36* 2.18* 1.99*  CALCIUM 7.8* 7.8* 7.6* 7.7* 7.9*  MG  --   --   --  1.9 1.9   GFR: Estimated Creatinine Clearance: 68.4 mL/min (A) (by C-G formula based on SCr of 1.99 mg/dL (H)). Liver Function Tests: No results for input(s): AST, ALT, ALKPHOS, BILITOT, PROT, ALBUMIN in the last 168 hours. No results for input(s): LIPASE, AMYLASE in the last 168 hours. No results for input(s): AMMONIA in the last 168 hours. Coagulation Profile: No results for input(s): INR, PROTIME in the last 168 hours. Cardiac Enzymes: No results for input(s): CKTOTAL, CKMB, CKMBINDEX, TROPONINI in the last 168 hours. BNP (last 3 results) No results for input(s): PROBNP in the last 8760 hours. HbA1C: No results for input(s): HGBA1C in the last  72 hours. CBG: Recent Labs  Lab 04/16/17 1555 04/16/17 2132 04/17/17 0721 04/17/17 0751 04/17/17 1145  GLUCAP 137* 77 48* 78 74   Lipid Profile: No results for input(s): CHOL, HDL, LDLCALC, TRIG, CHOLHDL, LDLDIRECT in the last 72 hours. Thyroid Function Tests: No results for input(s): TSH, T4TOTAL, FREET4, T3FREE, THYROIDAB in the last 72 hours. Anemia Panel: No results for input(s): VITAMINB12, FOLATE, FERRITIN, TIBC, IRON, RETICCTPCT in the last 72 hours. Urine analysis:    Component Value Date/Time   COLORURINE YELLOW 04/13/2017 1118   APPEARANCEUR HAZY (A) 04/13/2017 1118   LABSPEC 1.009 04/13/2017 1118   PHURINE 5.0 04/13/2017 1118   GLUCOSEU NEGATIVE 04/13/2017 1118   HGBUR SMALL (A) 04/13/2017 1118   BILIRUBINUR NEGATIVE 04/13/2017 1118   KETONESUR NEGATIVE 04/13/2017 1118   PROTEINUR 100 (A) 04/13/2017 1118   UROBILINOGEN 0.2 10/03/2012 1809   NITRITE NEGATIVE 04/13/2017 1118   LEUKOCYTESUR MODERATE (A) 04/13/2017 1118   Sepsis Labs: _1 (procalcitonin:4,lacticidven:4)  ) Recent Results (from the past 240 hour(s))  Culture, blood (routine x 2)     Status: None (Preliminary result)   Collection Time: 04/13/17  6:17 PM  Result Value Ref Range Status   Specimen Description BLOOD LEFT HAND  Final   Special Requests   Final    BOTTLES DRAWN AEROBIC AND ANAEROBIC Blood Culture adequate volume   Culture NO GROWTH 4 DAYS  Final   Report Status PENDING  Incomplete  Culture, blood (routine x 2)     Status: None (Preliminary result)   Collection Time: 04/13/17  6:17 PM  Result Value Ref Range Status   Specimen Description LEFT ANTECUBITAL  Final   Special Requests   Final    BOTTLES DRAWN AEROBIC AND ANAEROBIC Blood Culture adequate volume   Culture NO GROWTH 4 DAYS  Final   Report Status PENDING  Incomplete  MRSA PCR Screening     Status: None   Collection Time: 04/14/17  6:50 PM  Result Value Ref Range Status   MRSA by PCR NEGATIVE NEGATIVE Final     Comment:        The GeneXpert MRSA Assay (FDA approved for NASAL specimens only), is one component  of a comprehensive MRSA colonization surveillance program. It is not intended to diagnose MRSA infection nor to guide or monitor treatment for MRSA infections.   Aerobic/Anaerobic Culture (surgical/deep wound)     Status: None (Preliminary result)   Collection Time: 04/14/17  8:15 PM  Result Value Ref Range Status   Specimen Description SCROTUM  Final   Special Requests NONE  Final   Gram Stain   Final    RARE WBC PRESENT, PREDOMINANTLY PMN ABUNDANT GRAM NEGATIVE RODS ABUNDANT GRAM POSITIVE COCCI Performed at [facility], [address] [place], [place] [address]    Culture MODERATE ENTEROCOCCUS AVIUM  Final   Report Status PENDING  Incomplete   Organism ID, Bacteria ENTEROCOCCUS AVIUM  Final      Susceptibility   Enterococcus avium - MIC*    AMPICILLIN <=2 SENSITIVE Sensitive     VANCOMYCIN <=0.5 SENSITIVE Sensitive     GENTAMICIN SYNERGY SENSITIVE Sensitive     * MODERATE ENTEROCOCCUS AVIUM      Radiology Studies: No results found.   Scheduled Meds: . acyclovir  400 mg Oral q morning - 10a  . allopurinol  300 mg Oral Daily  . atorvastatin  80 mg Oral QHS  . calcitRIOL  0.25 mcg Oral Once per day on Mon Wed Fri  . Chlorhexidine Gluconate Cloth  6 each Topical Daily  . colchicine  0.6 mg Oral Daily  . dexamethasone  10 mg Oral Q Fri-1800  . Eluxadoline  100 mg Oral Daily  . gabapentin  300 mg Oral QHS  . insulin aspart  0-9 Units Subcutaneous TID WC  . insulin detemir  40 Units Subcutaneous q morning - 10a  . mouth rinse  15 mL Mouth Rinse BID  . metoprolol succinate  25 mg Oral Daily  . multivitamin with minerals  1 tablet Oral Daily  . pantoprazole  40 mg Oral Daily  . povidone-iodine   Topical Daily  . sulfamethoxazole-trimethoprim  1 tablet Oral Once per day on Mon Wed Fri  . torsemide  40 mg Oral Daily  . [START ON 04/20/2017] Vitamin D  (Ergocalciferol)  50,000 Units Oral Q7 days   Continuous Infusions: . dextrose 5 % and 0.45 % NaCl with KCl 10 mEq/L 100 mL/hr at 04/17/17 0922  . piperacillin-tazobactam (ZOSYN)  IV 3.375 g (04/17/17 1356)     LOS: 3 days    Time spent: 30 min    Janece Canterbury, MD [facility] Pager [phone]  If 7PM-7AM, please contact night-coverage [email or web address] Password [email or web address] 04/17/2017, 2:32 PM

## 2017-04-17 NOTE — Progress Notes (Signed)
CBG=48. 32ml of IV D50 given.  Repeat CBG=78.Patient NPO since midnight for I & D procedure scheduled this evening. Pt.  Is getting continuous infusion of NS @ 116ml/hr. Will continue to monitor.

## 2017-04-17 NOTE — Anesthesia Procedure Notes (Signed)
Procedure Name: Intubation Performed by: Gean Maidens, CRNA Pre-anesthesia Checklist: Patient identified, Emergency Drugs available, Suction available, Patient being monitored and Timeout performed Patient Re-evaluated:Patient Re-evaluated prior to induction Oxygen Delivery Method: Circle system utilized Preoxygenation: Pre-oxygenation with 100% oxygen Induction Type: IV induction Ventilation: Mask ventilation without difficulty and Oral airway inserted - appropriate to patient size Laryngoscope Size: Mac and 4 Grade View: Grade III Tube type: Oral Tube size: 7.5 mm Number of attempts: 1 Airway Equipment and Method: Stylet Placement Confirmation: ETT inserted through vocal cords under direct vision,  positive ETCO2,  CO2 detector and breath sounds checked- equal and bilateral Secured at: 24 cm Tube secured with: Tape Dental Injury: Teeth and Oropharynx as per pre-operative assessment  Difficulty Due To: Difficulty was anticipated Future Recommendations: Recommend- induction with short-acting agent, and alternative techniques readily available

## 2017-04-17 NOTE — Interval H&P Note (Signed)
History and Physical Interval Note:  04/17/2017 4:22 PM  [name]  has presented today for surgery, with the diagnosis of FOURNIER'S GANGRENE  The various methods of treatment have been discussed with the patient and family. After consideration of risks, benefits and other options for treatment, the patient has consented to  Procedure(s) with comments: IRRIGATION AND DEBRIDEMENT ABSCESS (N/A) - RM 3 as a surgical intervention .  The patient's history has been reviewed, patient examined, no change in status, stable for surgery.  I have reviewed the patient's chart and labs.  Questions were answered to the patient's satisfaction.     Conception Oms Doria Fern

## 2017-04-17 NOTE — Anesthesia Postprocedure Evaluation (Signed)
Anesthesia Post Note  Patient: [name]  Procedure(s) Performed: IRRIGATION AND DEBRIDEMENT ABSCESS (N/A Scrotum)     Patient location during evaluation: PACU Anesthesia Type: General Level of consciousness: awake and alert Pain management: pain level controlled Vital Signs Assessment: post-procedure vital signs reviewed and stable Respiratory status: spontaneous breathing, nonlabored ventilation, respiratory function stable and patient connected to nasal cannula oxygen Cardiovascular status: blood pressure returned to baseline and stable Postop Assessment: no apparent nausea or vomiting Anesthetic complications: no    Last Vitals:  Vitals:   04/17/17 1830 04/17/17 1845  BP: (!) 142/112 140/84  Pulse: 82 81  Resp: 16 19  Temp:  37.1 C  SpO2: 99% 97%    Last Pain:  Vitals:   04/17/17 1845  TempSrc:   PainSc: 3                  Bryon Parker DAVID

## 2017-04-17 NOTE — H&P (View-Only) (Signed)
Day of Surgery Subjective: No acute events  Objective: Vital signs in last 24 hours: Temp:  [98 F (36.7 C)-99.3 F (37.4 C)] 98.7 F (37.1 C) (11/16 1200) Pulse Rate:  [59-94] 60 (11/16 0700) Resp:  [12-25] 18 (11/16 0700) BP: (102-155)/(46-124) 106/51 (11/16 0700) SpO2:  [90 %-100 %] 92 % (11/16 0700) Weight:  [173.7 kg (382 lb 15 oz)] 173.7 kg (382 lb 15 oz) (11/16 0500)  Intake/Output from previous day: 11/15 0701 - 11/16 0700 In: 3366.7 [I.V.:2851.7; Blood:315; IV Piggyback:200] Out: 2875 [Urine:2875]  Intake/Output this shift: Total I/O In: 513.3 [I.V.:463.3; IV Piggyback:50] Out: 1800 [Urine:1800]  Physical Exam:  General: Alert and oriented CV: RRR, palpable distal pulses Lungs: CTAB, equal chest rise Abdomen: Soft, NTND, no rebound or guarding GU: Scrotal dressing clean and intact Ext: NT, No erythema  Lab Results: Recent Labs    04/15/17 1600 04/16/17 1145 04/17/17 1045  HGB 7.4* 7.0* 7.5*  HCT 22.4* 21.9* 22.6*   BMET Recent Labs    04/16/17 1145 04/17/17 1045  NA 139 142  K 4.1 4.0  CL 110 110  CO2 25 24  GLUCOSE 115* 74  BUN 33* 28*  CREATININE 2.18* 1.99*  CALCIUM 7.7* 7.9*     Studies/Results: No results found.  Assessment/Plan: [name] a 56 y.o.malewith epididymitis and free air within the scrotum, concerning for Fourniers gangrene. POD 3 s/p scrotal debridement S/p dressing change on 04/15/17 Poorly controlled Type II DM Obesity Multiple Myeloma Chronic Anemia CKD PAF (on Xarelto)  -Will proceed with debridement and dressing change under anesthesia.     LOS: 3 days   Ellison Hughs, MD 04/17/2017, 4:20 PM  [facility] Pager: [phone]

## 2017-04-17 NOTE — Transfer of Care (Signed)
Immediate Anesthesia Transfer of Care Note  Patient: [name]  Procedure(s) Performed: IRRIGATION AND DEBRIDEMENT ABSCESS (N/A Scrotum)  Patient Location: PACU  Anesthesia Type:General  Level of Consciousness: awake, alert  and responds to stimulation  Airway & Oxygen Therapy: Patient Spontanous Breathing and Patient connected to face mask oxygen  Post-op Assessment: Report given to RN and Post -op Vital signs reviewed and stable  Post vital signs: Reviewed and stable  Last Vitals:  Vitals:   04/17/17 1627 04/17/17 1632  BP: 115/64 109/73  Pulse: 63 61  Resp: 17 14  Temp:    SpO2: 100% 98%    Last Pain:  Vitals:   04/17/17 1624  TempSrc:   PainSc: 9       Patients Stated Pain Goal: 1 (17/91/50 5697)  Complications: No apparent anesthesia complications

## 2017-04-17 NOTE — Op Note (Signed)
Operative Note  Preoperative diagnosis:  1.  Fournier's gangrene  Postoperative diagnosis: 1.  Fournier's gangrene  Procedure(s): 1.  Debridement of scrotal wound with closure  Surgeon: Ellison Hughs, MD  Assistants:  None  Anesthesia:  Gen endotracheal  Complications:  None  EBL:  10 mL  Specimens: 1. None  Drains/Catheters: 1.  Bilateral scrotal penrose drains 2.  Foley catheter  Intraoperative findings:  Healthy granulation tissue along the scrotal skin edges and peritesticular tissue  Indication:  [name] is a 56 y.o. male s/p scrotal debridement secondary to Fournier's gangrene on 04/14/17 with subsequent re-debridement on 04/15/17.  Description of procedure:  After informed consent was obtained, the patient was brought to the operating room and general endotracheal anesthesia was administered. The patient was placed in the dorsolithotomy position, his previous dressings were removed and his scrotum/perineum were then prepped and draped in the usual sterile fashion. A timeout was performed.  The scrotal/perineal skin edges all appeared viable and there was excellent granulation tissue within the wound cavity surrounding the peritesticular tissue. There was no additional necrotic tissue to excise. The wound cavity measured approximately 15 x 10 x 10 cm in volume. Given the amount of granulation tissue, the decision was made to proceed with reapproximation of the wound. Penrose drains were then placed in the dependent portion of each hemiscrotum. The scrotal/perineal wound was then reapproximated loosely with interrupted 0 nylon sutures. The scrotal wound was then dressed with Telfa and ABD pads. The patient tolerated the procedure well and was transferred to the postanesthesia unit in stable condition.  Plan: Continue to monitor the patient on the floor and continue broad-spectrum antibiotics. Wound care has been consulted to treat his wound on the floor. Will  continue to monitor

## 2017-04-17 NOTE — Progress Notes (Signed)
Day of Surgery Subjective: No acute events  Objective: Vital signs in last 24 hours: Temp:  [98 F (36.7 C)-99.3 F (37.4 C)] 98.7 F (37.1 C) (11/16 1200) Pulse Rate:  [59-94] 60 (11/16 0700) Resp:  [12-25] 18 (11/16 0700) BP: (102-155)/(46-124) 106/51 (11/16 0700) SpO2:  [90 %-100 %] 92 % (11/16 0700) Weight:  [173.7 kg (382 lb 15 oz)] 173.7 kg (382 lb 15 oz) (11/16 0500)  Intake/Output from previous day: 11/15 0701 - 11/16 0700 In: 3366.7 [I.V.:2851.7; Blood:315; IV Piggyback:200] Out: 2875 [Urine:2875]  Intake/Output this shift: Total I/O In: 513.3 [I.V.:463.3; IV Piggyback:50] Out: 1800 [Urine:1800]  Physical Exam:  General: Alert and oriented CV: RRR, palpable distal pulses Lungs: CTAB, equal chest rise Abdomen: Soft, NTND, no rebound or guarding GU: Scrotal dressing clean and intact Ext: NT, No erythema  Lab Results: Recent Labs    04/15/17 1600 04/16/17 1145 04/17/17 1045  HGB 7.4* 7.0* 7.5*  HCT 22.4* 21.9* 22.6*   BMET Recent Labs    04/16/17 1145 04/17/17 1045  NA 139 142  K 4.1 4.0  CL 110 110  CO2 25 24  GLUCOSE 115* 74  BUN 33* 28*  CREATININE 2.18* 1.99*  CALCIUM 7.7* 7.9*     Studies/Results: No results found.  Assessment/Plan: Franklin Walleris a 56 y.o.malewith epididymitis and free air within the scrotum, concerning for Fourniers gangrene. POD 3 s/p scrotal debridement S/p dressing change on 04/15/17 Poorly controlled Type II DM Obesity Multiple Myeloma Chronic Anemia CKD PAF (on Xarelto)  -Will proceed with debridement and dressing change under anesthesia.     LOS: 3 days   Christopher Winter, MD 04/17/2017, 4:20 PM  [facility] Pager: [phone] 

## 2017-04-17 NOTE — Anesthesia Preprocedure Evaluation (Signed)
Anesthesia Evaluation  Patient identified by MRN, date of birth, ID band Patient awake    Reviewed: Allergy & Precautions, NPO status , Patient's Chart, lab work & pertinent test results  Airway Mallampati: II  TM Distance: >3 FB Neck ROM: Full    Dental   Pulmonary sleep apnea , former smoker,    Pulmonary exam normal        Cardiovascular hypertension, Pt. on medications + Past MI and + Cardiac Stents  Normal cardiovascular exam     Neuro/Psych    GI/Hepatic GERD  Medicated and Controlled,  Endo/Other  diabetes, Type 2, Insulin Dependent  Renal/GU Renal InsufficiencyRenal disease     Musculoskeletal   Abdominal   Peds  Hematology   Anesthesia Other Findings   Reproductive/Obstetrics                             Anesthesia Physical Anesthesia Plan  ASA: III  Anesthesia Plan: General   Post-op Pain Management:    Induction: Intravenous  PONV Risk Score and Plan: 2 and Ondansetron and Treatment may vary due to age or medical condition  Airway Management Planned: Oral ETT  Additional Equipment:   Intra-op Plan:   Post-operative Plan: Extubation in OR  Informed Consent: I have reviewed the patients History and Physical, chart, labs and discussed the procedure including the risks, benefits and alternatives for the proposed anesthesia with the patient or authorized representative who has indicated his/her understanding and acceptance.     Plan Discussed with: CRNA and Surgeon  Anesthesia Plan Comments:         Anesthesia Quick Evaluation

## 2017-04-18 ENCOUNTER — Encounter (HOSPITAL_COMMUNITY): Payer: Self-pay | Admitting: Urology

## 2017-04-18 LAB — CBC
HCT: 21.5 % — ABNORMAL LOW (ref 39.0–52.0)
Hemoglobin: 7 g/dL — ABNORMAL LOW (ref 13.0–17.0)
MCH: 29.8 pg (ref 26.0–34.0)
MCHC: 32.6 g/dL (ref 30.0–36.0)
MCV: 91.5 fL (ref 78.0–100.0)
Platelets: 141 10*3/uL — ABNORMAL LOW (ref 150–400)
RBC: 2.35 MIL/uL — ABNORMAL LOW (ref 4.22–5.81)
RDW: 17.4 % — ABNORMAL HIGH (ref 11.5–15.5)
WBC: 8.3 10*3/uL (ref 4.0–10.5)

## 2017-04-18 LAB — BASIC METABOLIC PANEL
Anion gap: 6 (ref 5–15)
BUN: 24 mg/dL — ABNORMAL HIGH (ref 6–20)
CO2: 26 mmol/L (ref 22–32)
Calcium: 7.6 mg/dL — ABNORMAL LOW (ref 8.9–10.3)
Chloride: 111 mmol/L (ref 101–111)
Creatinine, Ser: 2.08 mg/dL — ABNORMAL HIGH (ref 0.61–1.24)
GFR calc Af Amer: 39 mL/min — ABNORMAL LOW (ref >60)
GFR calc non Af Amer: 33 mL/min — ABNORMAL LOW (ref >60)
Glucose, Bld: 122 mg/dL — ABNORMAL HIGH (ref 65–99)
Potassium: 3.8 mmol/L (ref 3.5–5.1)
Sodium: 143 mmol/L (ref 135–145)

## 2017-04-18 LAB — GLUCOSE, CAPILLARY
Glucose-Capillary: 144 mg/dL — ABNORMAL HIGH (ref 65–99)
Glucose-Capillary: 246 mg/dL — ABNORMAL HIGH (ref 65–99)
Glucose-Capillary: 200 mg/dL — ABNORMAL HIGH (ref 65–99)
Glucose-Capillary: 206 mg/dL — ABNORMAL HIGH (ref 65–99)

## 2017-04-18 LAB — PREPARE RBC (CROSSMATCH)

## 2017-04-18 LAB — CULTURE, BLOOD (ROUTINE X 2)
Culture: NO GROWTH
Culture: NO GROWTH
Special Requests: ADEQUATE
Special Requests: ADEQUATE

## 2017-04-18 MED ORDER — ENOXAPARIN SODIUM 80 MG/0.8ML ~~LOC~~ SOLN
80.0000 mg | SUBCUTANEOUS | Status: DC
  Administered 2017-04-18: 80 mg via SUBCUTANEOUS
  Filled 2017-04-18: qty 0.8

## 2017-04-18 MED ORDER — LIP MEDEX EX OINT
TOPICAL_OINTMENT | CUTANEOUS | Status: AC
  Administered 2017-04-18: 1
  Filled 2017-04-18: qty 7

## 2017-04-18 MED ORDER — ELUXADOLINE 100 MG PO TABS
100.0000 mg | ORAL_TABLET | Freq: Two times a day (BID) | ORAL | Status: DC
  Administered 2017-04-18 – 2017-04-22 (×7): 100 mg via ORAL

## 2017-04-18 MED ORDER — SODIUM CHLORIDE 0.9 % IV SOLN
Freq: Once | INTRAVENOUS | Status: AC
  Administered 2017-04-18: 10:00:00 via INTRAVENOUS

## 2017-04-18 MED ORDER — ELUXADOLINE 100 MG PO TABS: 100.0000 mg | ORAL_TABLET | Freq: Two times a day (BID) | ORAL | Status: DC

## 2017-04-18 NOTE — NC FL2 (Signed)
[place] LEVEL OF CARE SCREENING TOOL     IDENTIFICATION  Patient Name: [name] Birthdate: [birth year] Sex: male Admission Date (Current Location): 04/13/2017  [facility] [place] and [place] Number:  Herbalist and Address:  [facility],  [place] [address], [place]      Provider Number: [id number]  Attending Physician Name and Address:  Janece Canterbury, MD  Relative Name and Phone Number:       Current Level of Care: Hospital Recommended Level of Care: [place] Prior Approval Number:    Date Approved/Denied:   PASRR Number:   [id number]  Discharge Plan: SNF    Current Diagnoses: Patient Active Problem List   Diagnosis Date Noted  . Fournier gangrene 04/14/2017  . Epididymitis 04/13/2017  . Acute epididymitis 04/13/2017  . Pneumonia 06/10/2016  . CAP (community acquired pneumonia) 06/10/2016  . Cellulitis of left lower extremity   . Hypogammaglobulinemia ([place]) 03/09/2016  . Hypokalemia 03/09/2016  . Diaphragmatic hernia without obstruction 02/26/2016  . Chronic systolic heart failure ([place]) 12/19/2013  . Anemia, normocytic normochromic 10/09/2012  . Chronic pancreatitis ([place]) 10/07/2012  . Atrial flutter ([place]) 09/17/2012  . DDD (degenerative disc disease), cervical 03/18/2012  . Arteriosclerotic cardiovascular disease (ASCVD)   . Hypertension   . Hyperlipidemia   . Multiple myeloma ([place]) 07/01/2011  . Morbid obesity ([place]) 04/29/2010  . Insulin dependent diabetes mellitus ([place]) 11/15/2008  . Sleep apnea 11/15/2008    Orientation RESPIRATION BLADDER Height & Weight     Self, Time, Situation, Place  Normal Continent Weight: (!) 382 lb 15 oz (173.7 kg) Height:  6' 2.5" (189.2 cm)  BEHAVIORAL SYMPTOMS/MOOD NEUROLOGICAL BOWEL NUTRITION STATUS      Continent Diet(Regular )  AMBULATORY STATUS COMMUNICATION OF NEEDS Skin     Verbally Other (Comment)(Closed Incisions: perineum, right toe, scrotum,  abdomen, right foot; Open wound- scrotum; Diabetic Foot ulcer; open wound- right buttocks stage 1)                       Personal Care Assistance Level of Assistance              Functional Limitations Info             SPECIAL CARE FACTORS FREQUENCY  PT (By licensed PT), OT (By licensed OT)     PT Frequency: 5 OT Frequency: 5            Contractures      Additional Factors Info  Code Status, Allergies Code Status Info: Full code  Allergies Info: NKA            Current Medications (04/18/2017):  This is the current hospital active medication list Current Facility-Administered Medications  Medication Dose Route Frequency Provider Last Rate Last Dose  . acetaminophen (TYLENOL) tablet 650 mg  650 mg Oral Q6H PRN Kinnie Feil, MD   650 mg at 04/13/17 1754  . acyclovir (ZOVIRAX) tablet 400 mg  400 mg Oral q morning - 10a Kinnie Feil, MD   400 mg at 04/18/17 0933  . allopurinol (ZYLOPRIM) tablet 300 mg  300 mg Oral Daily Kinnie Feil, MD   300 mg at 04/18/17 0944  . atorvastatin (LIPITOR) tablet 80 mg  80 mg Oral QHS Kinnie Feil, MD   80 mg at 04/17/17 2132  . calcitRIOL (ROCALTROL) capsule 0.25 mcg  0.25 mcg Oral Once per day on Mon Wed Fri Kinnie Feil, MD  0.25 mcg at 04/17/17 1015  . Chlorhexidine Gluconate Cloth 2 % PADS 6 each  6 each Topical Daily Dessa Phi, DO   6 each at 04/17/17 1523  . colchicine tablet 0.6 mg  0.6 mg Oral Daily Kinnie Feil, MD   0.6 mg at 04/18/17 0931  . dexamethasone (DECADRON) tablet 10 mg  10 mg Oral Q Fri-1800 Buriev, Ulugbek N, MD      . dextrose 5 % and 0.45 % NaCl with KCl 10 mEq/L infusion   Intravenous Continuous Janece Canterbury, MD   Stopped at 04/17/17 1635  . Eluxadoline TABS 100 mg  100 mg Oral Daily Kinnie Feil, MD   100 mg at 04/16/17 1130  . gabapentin (NEURONTIN) capsule 300 mg  300 mg Oral QHS Kinnie Feil, MD   300 mg at 04/17/17 2132  . HYDROmorphone (DILAUDID)  injection 1-2 mg  1-2 mg Intravenous Q4H PRN Allie Bossier, MD   2 mg at 04/18/17 0957  . insulin aspart (novoLOG) injection 0-9 Units  0-9 Units Subcutaneous TID WC Kinnie Feil, MD   1 Units at 04/18/17 0848  . insulin detemir (LEVEMIR) injection 30 Units  30 Units Subcutaneous q morning - 10a Janece Canterbury, MD   30 Units at 04/18/17 0935  . MEDLINE mouth rinse  15 mL Mouth Rinse BID Allie Bossier, MD   15 mL at 04/18/17 0948  . metoprolol succinate (TOPROL-XL) 24 hr tablet 25 mg  25 mg Oral Daily Kinnie Feil, MD   25 mg at 04/18/17 0933  . multivitamin with minerals tablet 1 tablet  1 tablet Oral Daily Kinnie Feil, MD   1 tablet at 04/18/17 0932  . oxyCODONE-acetaminophen (PERCOCET/ROXICET) 5-325 MG per tablet 1-2 tablet  1-2 tablet Oral Q4H PRN Rise Patience, MD   1 tablet at 04/17/17 1355  . pantoprazole (PROTONIX) EC tablet 40 mg  40 mg Oral Daily Kinnie Feil, MD   40 mg at 04/18/17 0932  . piperacillin-tazobactam (ZOSYN) IVPB 3.375 g  3.375 g Intravenous Q8H Dessa Phi, DO 12.5 mL/hr at 04/18/17 0532 3.375 g at 04/18/17 0532  . povidone-iodine (BETADINE) 10 % ointment   Topical Daily Short, Mackenzie, MD      . sodium chloride flush (NS) 0.9 % injection 10-40 mL  10-40 mL Intracatheter PRN Dessa Phi, DO      . sulfamethoxazole-trimethoprim (BACTRIM DS,SEPTRA DS) 800-160 MG per tablet 1 tablet  1 tablet Oral Once per day on Mon Wed Fri Kinnie Feil, MD   1 tablet at 04/17/17 0930  . torsemide (DEMADEX) tablet 40 mg  40 mg Oral Daily Kinnie Feil, MD   40 mg at 04/18/17 0931  . [START ON 04/20/2017] Vitamin D (Ergocalciferol) (DRISDOL) capsule 50,000 Units  50,000 Units Oral Q7 days Kinnie Feil, MD       Facility-Administered Medications Ordered in Other Encounters  Medication Dose Route Frequency Provider Last Rate Last Dose  . sodium chloride 0.9 % injection 10 mL  10 mL Intravenous Once Farrel Gobble, MD         Discharge  Medications: Please see discharge summary for a list of discharge medications.  Relevant Imaging Results:  Relevant Lab Results:   Additional Information SS#:[id number]  Weston Anna, LCSW

## 2017-04-18 NOTE — Progress Notes (Addendum)
PROGRESS NOTE  [name]  RKY:[id number] DOB: [birth year] DOA: 04/13/2017 PCP: [name], MD  Brief Narrative:   56 year old male with history of coronary artery disease, chronic systolic heart failure with ejection fraction 35-40%, paroxysmal atrial flutter on Xarelto, CKD stage III, chronic diarrhea, multiple myeloma, OSA on CPAP, and previous history of epididymitis who presented with scrotal pain.  He had a laceration to the posterior wall of the scrotum while sliding down from his bed a few days prior to admission.  He developed worsening pain without fever, chills.  Ultrasound in the emergency department and demonstrated a prominent epididymis on the left.  The patient was also found to have subcutaneous air on exam.  Urology was consulted and the patient was started on IV antibiotics.  He was transferred from [facility] due to the concern of Fournier's gangrene and he underwent his first debridement on 11/13.  He had a second debridement on 11/14 and is awaiting a third debridement on 11/16.  Initial cultures grew mixed flora and he has so far speciated enterococcus avium which is pansensitive.  His antibiotics will be narrowed to Zosyn accordingly.  His MRSA PCR was negative.  Assessment & Plan:   Principal Problem:   Fournier gangrene Active Problems:   Hypertension   Atrial flutter (HCC)   Anemia, normocytic normochromic   Epididymitis   Acute epididymitis  Severe Sepsis secondary to Fournier's Gangrene of the Scrotum  -11/13 Debridement scrotal abscess -11/14 Second debridement scrotal abscess - 11/16 Third debridement with closure and placement of penrose drains - Blood cultures NGTD - OR cultures with gram-positive cocci and gram-negative rods, enterococcus avium -Continue Zosyn -  Mobilize with PT/OT  Diabetes type 2 uncontrolled with complications, CBG low normal -11/1 Hemoglobin A1c= 8.2 - Continue Levemir to 30 units (already received this  morning's dose)  -  D/c dextrose containing IVF - Continue Sensitive SSI  Normocytic anemia with decrease in hemoglobin due to recent surgery (acute blood loss) and serious infection in the setting of multiple myeloma.  No obvious hemorrhage -11/15 transfuse 1 unit. PRBC - 11/16 transfused 1 unit PRBC  Acute on CKD stage III (baseline Cr 1.7 -1.9 ) - Creatinine back to baseline -  Holding sodium bicarb as CO2 is stable  Chronic Systolic CHF (EF 31-51%) -Wt approximately stable -  Continue torsemide -  Continue metoprolol -  Holding ACEI  HTN -Toprol 25 mg daily   CAD -Lipitor 80 mg daily  Paroxysmal atrial fibrillation -Holding anticoagulation secondary to surgery, but now that wound is closed, may be able to resume soon  Multiple myeloma -Continue decadron, eluxadoline, revlimid, bactrim, acyclovir   DVT prophylaxis:  Start lovenox today and if hemoglobin stable and no plans for further surgery, resume xarelto in next few days Code Status:  Full code Family Communication:  Patient and his wife at bedside Disposition Plan:  Continue IV antibiotics awaiting final culture results.  Transfer to med-surg.  PT/OT consults.    Consultants:  Urology Dr. Conception Oms Winter   Procedures:  11/13 S/P I&D necrotic scrotal skin and drainage of scrotal abscess 11/14 S/P I&D necrotic scrotal skin with packing change. 11/15 transfused 1 unit. PRBC 11/16 transfused 1 unit PRBC  Antimicrobials:  Anti-infectives (From admission, onward)   Start     Dose/Rate Route Frequency Ordered Stop   04/16/17 1500  vancomycin (VANCOCIN) 1,500 mg in sodium chloride 0.9 % 500 mL IVPB  Status:  Discontinued     1,500 mg  250 mL/hr over 120 Minutes Intravenous Every 24 hours 04/16/17 1356 04/17/17 1343   04/15/17 1400  vancomycin (VANCOCIN) 1,250 mg in sodium chloride 0.9 % 250 mL IVPB  Status:  Discontinued     1,250 mg 166.7 mL/hr over 90 Minutes Intravenous Every 24 hours 04/15/17  0911 04/16/17 1356   04/15/17 1300  vancomycin (VANCOCIN) 1,750 mg in sodium chloride 0.9 % 500 mL IVPB  Status:  Discontinued     1,750 mg 250 mL/hr over 120 Minutes Intravenous Every 24 hours 04/14/17 1319 04/15/17 0911   04/15/17 0900  sulfamethoxazole-trimethoprim (BACTRIM DS,SEPTRA DS) 800-160 MG per tablet 1 tablet     1 tablet Oral Once per day on Mon Wed Fri 04/13/17 1412     04/14/17 1944  piperacillin-tazobactam (ZOSYN) 3.375 GM/50ML IVPB    Comments:  Williford, Peggy   : cabinet override      04/14/17 1944 04/14/17 2024   04/14/17 1630  clindamycin (CLEOCIN) IVPB 900 mg  Status:  Discontinued     900 mg 100 mL/hr over 30 Minutes Intravenous Every 8 hours 04/14/17 1611 04/14/17 2246   04/14/17 1400  piperacillin-tazobactam (ZOSYN) IVPB 3.375 g     3.375 g 12.5 mL/hr over 240 Minutes Intravenous Every 8 hours 04/14/17 1309     04/14/17 1315  vancomycin (VANCOCIN) IVPB 1000 mg/200 mL premix     1,000 mg 200 mL/hr over 60 Minutes Intravenous Every 1 hr x 2 04/14/17 1313 04/14/17 1836   04/14/17 1000  acyclovir (ZOVIRAX) tablet 400 mg     400 mg Oral  Every morning - 10a 04/13/17 1412     04/13/17 2000  ceFEPIme (MAXIPIME) 2 g in dextrose 5 % 50 mL IVPB  Status:  Discontinued     2 g 100 mL/hr over 30 Minutes Intravenous Every 24 hours 04/13/17 1716 04/14/17 1309   04/13/17 1800  doxycycline (VIBRA-TABS) tablet 100 mg  Status:  Discontinued     100 mg Oral Every 12 hours 04/13/17 1648 04/14/17 1309   04/13/17 1300  levofloxacin (LEVAQUIN) IVPB 500 mg     500 mg 100 mL/hr over 60 Minutes Intravenous  Once 04/13/17 1255 04/13/17 1507       Subjective:  Improved scrotal pain and RLQ pain.  Denies SOB, chest pressure/pain, nausea.  Eating and drinking well.  Denies constipation.  Has foley catheter.    Objective: Vitals:   04/18/17 1158 04/18/17 1200 04/18/17 1215 04/18/17 1220  BP: (!) 114/50  (!) 140/57 (!) 127/57  Pulse: 73  70 71  Resp: _0 Temp: 98.7 F (37.1  C) 99.7 F (37.6 C) 98.2 F (36.8 C) 98.4 F (36.9 C)  TempSrc: Oral Oral Oral Oral  SpO2: 97%  96% 97%  Weight:      Height:        Intake/Output Summary (Last 24 hours) at 04/18/2017 1453 Last data filed at 04/18/2017 1442 Gross per 24 hour  Intake 2083 ml  Output 2950 ml  Net -867 ml   Filed Weights   04/13/17 1613 04/16/17 0500 04/17/17 0500  Weight: (!) 173.4 kg (382 lb 3.2 oz) (!) 175.7 kg (387 lb 5.6 oz) (!) 173.7 kg (382 lb 15 oz)    Examination:  General exam:  Adult male, obese.  No acute distress.  HEENT:  NCAT, MMM Respiratory system: Clear to auscultation bilaterally Cardiovascular system: Regular rate and rhythm, normal S1/S2. No murmurs, rubs, gallops or clicks.  Warm extremities Gastrointestinal system: Normal active  bowel sounds, soft, nondistended, nontender.  RLQ quadrant no longer TTP.   MSK:  Normal tone and bulk, no lower extremity edema Neuro:  Grossly intact GU:  Scrotum still very TTP, mildly erythematous, warm to touch, but unable to see dressing or penrose drains from current angle.      Data Reviewed: I have personally reviewed following labs and imaging studies  CBC: Recent Labs  Lab 04/13/17 1143 04/14/17 0530 04/15/17 1600 04/16/17 1145 04/17/17 1045 04/18/17 0403  WBC 16.6* 16.4* 9.9 8.4 9.1 8.3  NEUTROABS 14.7*  --  6.7 5.2  --   --   HGB 9.0* 8.5* 7.4* 7.0* 7.5* 7.0*  HCT 28.9* 26.8* 22.4* 21.9* 22.6* 21.5*  MCV 95.1 94.7 91.1 91.6 90.8 91.5  PLT 157 127* 132* 134* 140* 935*   Basic Metabolic Panel: Recent Labs  Lab 04/14/17 0530 04/15/17 1600 04/16/17 1145 04/17/17 1045 04/18/17 0403  NA 134* 137 139 142 143  K 4.7 4.1 4.1 4.0 3.8  CL 103 106 110 110 111  CO2 _0 GLUCOSE 239* 90 115* 74 122*  BUN 42* 35* 33* 28* 24*  CREATININE 2.36* 2.36* 2.18* 1.99* 2.08*  CALCIUM 7.8* 7.6* 7.7* 7.9* 7.6*  MG  --   --  1.9 1.9  --    GFR: Estimated Creatinine Clearance: 65.4 mL/min (A) (by C-G formula based on  SCr of 2.08 mg/dL (H)). Liver Function Tests: No results for input(s): AST, ALT, ALKPHOS, BILITOT, PROT, ALBUMIN in the last 168 hours. No results for input(s): LIPASE, AMYLASE in the last 168 hours. No results for input(s): AMMONIA in the last 168 hours. Coagulation Profile: No results for input(s): INR, PROTIME in the last 168 hours. Cardiac Enzymes: No results for input(s): CKTOTAL, CKMB, CKMBINDEX, TROPONINI in the last 168 hours. BNP (last 3 results) No results for input(s): PROBNP in the last 8760 hours. HbA1C: No results for input(s): HGBA1C in the last 72 hours. CBG: Recent Labs  Lab 04/17/17 1532 04/17/17 1814 04/17/17 1828 04/17/17 2251 04/18/17 0831  GLUCAP 73 45* 89 101* 144*   Lipid Profile: No results for input(s): CHOL, HDL, LDLCALC, TRIG, CHOLHDL, LDLDIRECT in the last 72 hours. Thyroid Function Tests: No results for input(s): TSH, T4TOTAL, FREET4, T3FREE, THYROIDAB in the last 72 hours. Anemia Panel: No results for input(s): VITAMINB12, FOLATE, FERRITIN, TIBC, IRON, RETICCTPCT in the last 72 hours. Urine analysis:    Component Value Date/Time   COLORURINE YELLOW 04/13/2017 1118   APPEARANCEUR HAZY (A) 04/13/2017 1118   LABSPEC 1.009 04/13/2017 1118   PHURINE 5.0 04/13/2017 1118   GLUCOSEU NEGATIVE 04/13/2017 1118   HGBUR SMALL (A) 04/13/2017 1118   BILIRUBINUR NEGATIVE 04/13/2017 1118   KETONESUR NEGATIVE 04/13/2017 1118   PROTEINUR 100 (A) 04/13/2017 1118   UROBILINOGEN 0.2 10/03/2012 1809   NITRITE NEGATIVE 04/13/2017 1118   LEUKOCYTESUR MODERATE (A) 04/13/2017 1118   Sepsis Labs: _1 (procalcitonin:4,lacticidven:4)  ) Recent Results (from the past 240 hour(s))  Culture, blood (routine x 2)     Status: None   Collection Time: 04/13/17  6:17 PM  Result Value Ref Range Status   Specimen Description BLOOD LEFT HAND  Final   Special Requests   Final    BOTTLES DRAWN AEROBIC AND ANAEROBIC Blood Culture adequate volume   Culture NO GROWTH 5  DAYS  Final   Report Status 04/18/2017 FINAL  Final  Culture, blood (routine x 2)     Status: None   Collection Time: 04/13/17  6:17 PM  Result Value Ref Range Status   Specimen Description LEFT ANTECUBITAL  Final   Special Requests   Final    BOTTLES DRAWN AEROBIC AND ANAEROBIC Blood Culture adequate volume   Culture NO GROWTH 5 DAYS  Final   Report Status 04/18/2017 FINAL  Final  MRSA PCR Screening     Status: None   Collection Time: 04/14/17  6:50 PM  Result Value Ref Range Status   MRSA by PCR NEGATIVE NEGATIVE Final    Comment:        The GeneXpert MRSA Assay (FDA approved for NASAL specimens only), is one component of a comprehensive MRSA colonization surveillance program. It is not intended to diagnose MRSA infection nor to guide or monitor treatment for MRSA infections.   Aerobic/Anaerobic Culture (surgical/deep wound)     Status: None (Preliminary result)   Collection Time: 04/14/17  8:15 PM  Result Value Ref Range Status   Specimen Description SCROTUM  Final   Special Requests NONE  Final   Gram Stain   Final    RARE WBC PRESENT, PREDOMINANTLY PMN ABUNDANT GRAM NEGATIVE RODS ABUNDANT GRAM POSITIVE COCCI    Culture   Final    MODERATE ENTEROCOCCUS AVIUM HOLDING FOR POSSIBLE ANAEROBE Performed at [facility], [place] [address], [place], [place] [address]    Report Status PENDING  Incomplete   Organism ID, Bacteria ENTEROCOCCUS AVIUM  Final      Susceptibility   Enterococcus avium - MIC*    AMPICILLIN <=2 SENSITIVE Sensitive     VANCOMYCIN <=0.5 SENSITIVE Sensitive     GENTAMICIN SYNERGY SENSITIVE Sensitive     * MODERATE ENTEROCOCCUS AVIUM      Radiology Studies: No results found.   Scheduled Meds: . acyclovir  400 mg Oral q morning - 10a  . allopurinol  300 mg Oral Daily  . atorvastatin  80 mg Oral QHS  . calcitRIOL  0.25 mcg Oral Once per day on Mon Wed Fri  . Chlorhexidine Gluconate Cloth  6 each Topical Daily  . colchicine  0.6 mg Oral  Daily  . dexamethasone  10 mg Oral Q Fri-1800  . Eluxadoline  100 mg Oral BID WC  . gabapentin  300 mg Oral QHS  . insulin aspart  0-9 Units Subcutaneous TID WC  . insulin detemir  30 Units Subcutaneous q morning - 10a  . mouth rinse  15 mL Mouth Rinse BID  . metoprolol succinate  25 mg Oral Daily  . multivitamin with minerals  1 tablet Oral Daily  . pantoprazole  40 mg Oral Daily  . povidone-iodine   Topical Daily  . sulfamethoxazole-trimethoprim  1 tablet Oral Once per day on Mon Wed Fri  . torsemide  40 mg Oral Daily  . [START ON 04/20/2017] Vitamin D (Ergocalciferol)  50,000 Units Oral Q7 days   Continuous Infusions: . dextrose 5 % and 0.45 % NaCl with KCl 10 mEq/L 1,000 mL (04/18/17 1447)  . piperacillin-tazobactam (ZOSYN)  IV Stopped (04/18/17 0932)     LOS: 4 days    Time spent: 30 min    Janece Canterbury, MD [facility] Pager [phone]  If 7PM-7AM, please contact night-coverage [email or web address] Password [email or web address] 04/18/2017, 2:53 PM

## 2017-04-18 NOTE — Consult Note (Signed)
[facility] Nurse wound consult note (telephonic with RN following record review, no face to face performed) Reason for Consult:Daily wound care to closed incision at scrotum requested by urologist Wound type: Surgical following debridement of area affected by Fournier's gangrene (infectious) Noted this morning that MD Winter has requested [place] Nurse for daily wound care to closed incision.  No WOC nurse services are available on the weekend;dressing over  closed incision with drains can be performed by Nursing. Order provided for Bedside RN to perform wound care. [facility] Nurse will assess sacral area when next in house (Monday, 04/20/17); Chart review indicates that patient is on a therapeutic mattress replacement with low air loss feature, is being turned and repositioned from side to side.  It is noted that he has undergone 3 operative procedures (11/13, 11/14, 11/16) which place him at increased risk for pressure injury. Other risk factors for pressure injury are position for operative procedures which is noted to be the dorsolithotomy position. Patient's weight is 377.74 pounds. Pa[place] is on Xarelto.Patient has diabetes. [place] nursing team will follow, and will remain available to this patient, the nursing and medical teams.   Thanks, Maudie Flakes, MSN, RN, [place], Arther Abbott  Pager# [phone]

## 2017-04-18 NOTE — Progress Notes (Signed)
ANTICOAGULATION CONSULT NOTE - Initial Consult  Pharmacy Consult for Lovenox Indication: VTE prophylaxis  No Known Allergies  Patient Measurements: Height: 6' 2.5" (189.2 cm) Weight: (!) 382 lb 15 oz (173.7 kg) IBW/kg (Calculated) : 83.35  Vital Signs: Temp: 98.3 F (36.8 C) (11/17 1442) Temp Source: Oral (11/17 1442) BP: 140/80 (11/17 1442) Pulse Rate: 66 (11/17 1442)  Labs: Recent Labs    04/16/17 1145 04/17/17 1045 04/18/17 0403  HGB 7.0* 7.5* 7.0*  HCT 21.9* 22.6* 21.5*  PLT 134* 140* 141*  CREATININE 2.18* 1.99* 2.08*    Estimated Creatinine Clearance: 65.4 mL/min (A) (by C-G formula based on SCr of 2.08 mg/dL (H)).   Medical History: Past Medical History:  Diagnosis Date  . Anemia   . Arteriosclerotic cardiovascular disease (ASCVD)    MI-2000s; stent to the proximal LAD and diagonal in 2001; stress nuclear in 2008-impaired exercise capacity, left ventricular dilatation, moderately to severely depressed EF, apical, inferior and anteroseptal scar  . Arthritis   . Atrial flutter ([place])   . 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 Cellulitis of leg    both legs  . Chronic diarrhea   . Chronic kidney disease, stage 3, mod decreased GFR (HCC)    Creatinine of 1.84 in 06/2011 and 1.5 in 07/2011  . Diabetes mellitus    Insulin  . Dysrhythmia    AFlutter  . GERD (gastroesophageal reflux disease)   . Gout   . Hyperlipidemia   . Hypertension   . Injection site reaction   . Multiple myeloma 07/01/2011  . Myocardial infarction ([place]) 2000  . Obesity   . Pedal edema    Venous insufficiency  . Sleep apnea    uses cpap  . Ulcer     Medications:  Scheduled:  . acyclovir  400 mg Oral q morning - 10a  . allopurinol  300 mg Oral Daily  . atorvastatin  80 mg Oral QHS  . calcitRIOL  0.25 mcg Oral Once per day on Mon Wed Fri  . Chlorhexidine Gluconate Cloth  6 each Topical Daily  . colchicine  0.6 mg Oral Daily  . dexamethasone  10 mg Oral Q Fri-1800  . Eluxadoline   100 mg Oral BID WC  . gabapentin  300 mg Oral QHS  . insulin aspart  0-9 Units Subcutaneous TID WC  . insulin detemir  30 Units Subcutaneous q morning - 10a  . mouth rinse  15 mL Mouth Rinse BID  . metoprolol succinate  25 mg Oral Daily  . multivitamin with minerals  1 tablet Oral Daily  . pantoprazole  40 mg Oral Daily  . povidone-iodine   Topical Daily  . sulfamethoxazole-trimethoprim  1 tablet Oral Once per day on Mon Wed Fri  . torsemide  40 mg Oral Daily  . [START ON 04/20/2017] Vitamin D (Ergocalciferol)  50,000 Units Oral Q7 days   Infusions:  . piperacillin-tazobactam (ZOSYN)  IV 3.375 g (04/18/17 1500)    Assessment: PHARMACY CONSULT: Lovenox for VTE prophylaxis  Noted PTA Xarelto for afib on hold d/t recent surgical procedures.  Wt: 173.7 kg BMI:  48  Scr:  2.08 CrCl >30 ml/hr  CBC: Hgb 7, Plt 141   Plan:   Due to obesity, begin weight-adjusted Lovenox 80 mg (~0.56m/kg) sq q24h   Dosage remains stable and need for further dosage adjustment appears unlikely at present.  Pharmacy will sign off at this time.  Please reconsult if a change in clinical status warrants re-evaluation of dosage.  CGretta Arab  PharmD, BCPS Pager [phone] 04/18/2017 3:06 PM

## 2017-04-19 LAB — GLUCOSE, CAPILLARY
Glucose-Capillary: 130 mg/dL — ABNORMAL HIGH (ref 65–99)
Glucose-Capillary: 151 mg/dL — ABNORMAL HIGH (ref 65–99)
Glucose-Capillary: 123 mg/dL — ABNORMAL HIGH (ref 65–99)
Glucose-Capillary: 132 mg/dL — ABNORMAL HIGH (ref 65–99)

## 2017-04-19 LAB — AEROBIC/ANAEROBIC CULTURE W GRAM STAIN (SURGICAL/DEEP WOUND)

## 2017-04-19 LAB — CBC
HCT: 22 % — ABNORMAL LOW (ref 39.0–52.0)
Hemoglobin: 7.2 g/dL — ABNORMAL LOW (ref 13.0–17.0)
MCH: 30 pg (ref 26.0–34.0)
MCHC: 32.7 g/dL (ref 30.0–36.0)
MCV: 91.7 fL (ref 78.0–100.0)
Platelets: 159 10*3/uL (ref 150–400)
RBC: 2.4 MIL/uL — ABNORMAL LOW (ref 4.22–5.81)
RDW: 17.3 % — ABNORMAL HIGH (ref 11.5–15.5)
WBC: 8.9 10*3/uL (ref 4.0–10.5)

## 2017-04-19 LAB — BASIC METABOLIC PANEL
Anion gap: 5 (ref 5–15)
BUN: 21 mg/dL — ABNORMAL HIGH (ref 6–20)
CO2: 25 mmol/L (ref 22–32)
Calcium: 7.2 mg/dL — ABNORMAL LOW (ref 8.9–10.3)
Chloride: 109 mmol/L (ref 101–111)
Creatinine, Ser: 1.84 mg/dL — ABNORMAL HIGH (ref 0.61–1.24)
GFR calc Af Amer: 45 mL/min — ABNORMAL LOW (ref >60)
GFR calc non Af Amer: 39 mL/min — ABNORMAL LOW (ref >60)
Glucose, Bld: 136 mg/dL — ABNORMAL HIGH (ref 65–99)
Potassium: 3.6 mmol/L (ref 3.5–5.1)
Sodium: 139 mmol/L (ref 135–145)

## 2017-04-19 LAB — OCCULT BLOOD X 1 CARD TO LAB, STOOL: Fecal Occult Bld: NEGATIVE

## 2017-04-19 MED ORDER — SODIUM CHLORIDE 0.9 % IV SOLN
3.0000 g | Freq: Four times a day (QID) | INTRAVENOUS | Status: DC
  Administered 2017-04-19 – 2017-04-22 (×11): 3 g via INTRAVENOUS
  Filled 2017-04-19 (×13): qty 3

## 2017-04-19 MED ORDER — OXYCODONE HCL 5 MG PO TABS
15.0000 mg | ORAL_TABLET | ORAL | Status: DC | PRN
  Administered 2017-04-19 – 2017-04-22 (×13): 15 mg via ORAL
  Filled 2017-04-19 (×13): qty 3

## 2017-04-19 MED ORDER — HYDROMORPHONE HCL 1 MG/ML IJ SOLN
0.5000 mg | Freq: Three times a day (TID) | INTRAMUSCULAR | Status: DC | PRN
Start: 2017-04-19 — End: 2017-04-20
  Administered 2017-04-19: 0.5 mg via INTRAVENOUS
  Filled 2017-04-19: qty 0.5

## 2017-04-19 MED ORDER — ACETAMINOPHEN 325 MG PO TABS
650.0000 mg | ORAL_TABLET | Freq: Three times a day (TID) | ORAL | Status: DC
  Administered 2017-04-19 – 2017-04-22 (×11): 650 mg via ORAL
  Filled 2017-04-19 (×11): qty 2

## 2017-04-19 MED ORDER — GABAPENTIN 300 MG PO CAPS
300.0000 mg | ORAL_CAPSULE | Freq: Two times a day (BID) | ORAL | Status: DC
  Administered 2017-04-19 – 2017-04-22 (×6): 300 mg via ORAL
  Filled 2017-04-19 (×6): qty 1

## 2017-04-19 MED ORDER — RIVAROXABAN 20 MG PO TABS
20.0000 mg | ORAL_TABLET | Freq: Every day | ORAL | Status: DC
  Administered 2017-04-19 – 2017-04-21 (×3): 20 mg via ORAL
  Filled 2017-04-19 (×3): qty 1

## 2017-04-19 MED ORDER — MENTHOL 3 MG MT LOZG
1.0000 | LOZENGE | OROMUCOSAL | Status: DC | PRN
  Administered 2017-04-19: 3 mg via ORAL
  Filled 2017-04-19: qty 9

## 2017-04-19 MED ORDER — GABAPENTIN 300 MG PO CAPS
300.0000 mg | ORAL_CAPSULE | Freq: Three times a day (TID) | ORAL | Status: DC
  Administered 2017-04-19 (×2): 300 mg via ORAL
  Filled 2017-04-19 (×2): qty 1

## 2017-04-19 MED ORDER — INSULIN ASPART 100 UNIT/ML ~~LOC~~ SOLN
5.0000 [IU] | Freq: Three times a day (TID) | SUBCUTANEOUS | Status: DC
  Administered 2017-04-19 – 2017-04-20 (×4): 5 [IU] via SUBCUTANEOUS

## 2017-04-19 NOTE — Progress Notes (Signed)
OT Cancellation Note  Patient Details Name: [name] MRN: [id number] DOB: Sep 08, 1960   Cancelled Treatment:    Reason Eval/Treat Not Completed: Fatigue/lethargy limiting ability to participate;Pain limiting ability to participate. Pt just returned to bed, c/o pain currently and just received pain meds. Will follow up for OT eval as time allows.  Binnie Kand M.S., OTR/L Pager: [phone]  04/19/2017, 2:27 PM

## 2017-04-19 NOTE — Progress Notes (Signed)
PROGRESS NOTE  [name]  TGG:[id number] DOB: [birth year] DOA: 04/13/2017 PCP: [name], MD  Brief Narrative:   56 year old male with history of coronary artery disease, chronic systolic heart failure with ejection fraction 35-40%, paroxysmal atrial flutter on Xarelto, CKD stage III, chronic diarrhea, multiple myeloma, OSA on CPAP, and previous history of epididymitis who presented with scrotal pain.  He had a laceration to the posterior wall of the scrotum while sliding down from his bed a few days prior to admission.  He developed worsening pain without fever, chills.  Ultrasound in the emergency department and demonstrated a prominent epididymis on the left.  The patient was also found to have subcutaneous air on exam.  Urology was consulted and the patient was started on IV antibiotics.  He was transferred from [facility] for Fournier's gangrene and he underwent his first debridement on 11/13, second debridement on 11/14, and final debridement with closure on 11/16.  Culture grew enterococcus avium which is pansensitive and mixed anaerobic flora.  His antibiotics were narrowed to Unasyn.  His MRSA PCR was negative.  Assessment & Plan:   Principal Problem:   Fournier gangrene Active Problems:   Hypertension   Atrial flutter (HCC)   Anemia, normocytic normochromic   Epididymitis   Acute epididymitis  Severe Sepsis secondary to Fournier's Gangrene of the Scrotum  -11/13 Debridement scrotal abscess -11/14 Second debridement scrotal abscess - 11/16 Third debridement with closure and placement of penrose drains - Blood cultures NGTD - OR cultures with gram-positive cocci and gram-negative rods, enterococcus avium - Change zosyn to [place] [facility] with PT/OT -  Work on decreasing IV dilaudid -  Start oxycodone 15 mg q3h prn -  Schedule tylenol -  Increase gabapentin to BID (due to CrCl) -  Unable to start NSAID due to CKD  Diabetes type 2 uncontrolled with  complications, CBG elevated in afternoon -11/1 Hemoglobin A1c= 8.2 - Continue Levemir to 30 units - Continue Sensitive SSI -  Add standing aspart 5 units with meals  Normocytic anemia with decrease in hemoglobin due to recent surgery (acute blood loss) and serious infection in the setting of multiple myeloma.  No obvious hemorrhage -11/15 transfuse 1 unit. PRBC - 11/16 transfused 1 unit PRBC -  Check ferritin, TSH, folate, b12 with AM  Acute on CKD stage III (baseline Cr 1.7 -1.9 ) - Creatinine back to baseline -  Holding sodium bicarb as CO2 is stable  Chronic Systolic CHF (EF 70-35%) -Wt approximately stable -  Continue torsemide -  Continue metoprolol -  Holding ACEI  HTN -Toprol 25 mg daily   CAD -Lipitor 80 mg daily  Paroxysmal atrial fibrillation -resume anticoagulation  Multiple myeloma -Continue decadron, eluxadoline, revlimid, bactrim, acyclovir   DVT prophylaxis:  xarelto Code Status:  Full code Family Communication:  Patient and his wife at bedside Disposition Plan:  Continue IV antibiotics.  PT/OT assessments.  Will need SNF at discharge Consultants:  Urology Dr. Conception Oms Winter   Procedures:  11/13 S/P I&D necrotic scrotal skin and drainage of scrotal abscess 11/14 S/P I&D necrotic scrotal skin with packing change. 11/15 transfused 1 unit. PRBC 11/16 transfused 1 unit PRBC  Antimicrobials:  Anti-infectives (From admission, onward)   Start     Dose/Rate Route Frequency Ordered Stop   04/16/17 1500  vancomycin (VANCOCIN) 1,500 mg in sodium chloride 0.9 % 500 mL IVPB  Status:  Discontinued     1,500 mg 250 mL/hr over 120 Minutes Intravenous Every  24 hours 04/16/17 1356 04/17/17 1343   04/15/17 1400  vancomycin (VANCOCIN) 1,250 mg in sodium chloride 0.9 % 250 mL IVPB  Status:  Discontinued     1,250 mg 166.7 mL/hr over 90 Minutes Intravenous Every 24 hours 04/15/17 0911 04/16/17 1356   04/15/17 1300  vancomycin (VANCOCIN) 1,750 mg in  sodium chloride 0.9 % 500 mL IVPB  Status:  Discontinued     1,750 mg 250 mL/hr over 120 Minutes Intravenous Every 24 hours 04/14/17 1319 04/15/17 0911   04/15/17 0900  sulfamethoxazole-trimethoprim (BACTRIM DS,SEPTRA DS) 800-160 MG per tablet 1 tablet     1 tablet Oral Once per day on Mon Wed Fri 04/13/17 1412     04/14/17 1944  piperacillin-tazobactam (ZOSYN) 3.375 GM/50ML IVPB    Comments:  Williford, Peggy   : cabinet override      04/14/17 1944 04/14/17 2024   04/14/17 1630  clindamycin (CLEOCIN) IVPB 900 mg  Status:  Discontinued     900 mg 100 mL/hr over 30 Minutes Intravenous Every 8 hours 04/14/17 1611 04/14/17 2246   04/14/17 1400  piperacillin-tazobactam (ZOSYN) IVPB 3.375 g     3.375 g 12.5 mL/hr over 240 Minutes Intravenous Every 8 hours 04/14/17 1309     04/14/17 1315  vancomycin (VANCOCIN) IVPB 1000 mg/200 mL premix     1,000 mg 200 mL/hr over 60 Minutes Intravenous Every 1 hr x 2 04/14/17 1313 04/14/17 1836   04/14/17 1000  acyclovir (ZOVIRAX) tablet 400 mg     400 mg Oral  Every morning - 10a 04/13/17 1412     04/13/17 2000  ceFEPIme (MAXIPIME) 2 g in dextrose 5 % 50 mL IVPB  Status:  Discontinued     2 g 100 mL/hr over 30 Minutes Intravenous Every 24 hours 04/13/17 1716 04/14/17 1309   04/13/17 1800  doxycycline (VIBRA-TABS) tablet 100 mg  Status:  Discontinued     100 mg Oral Every 12 hours 04/13/17 1648 04/14/17 1309   04/13/17 1300  levofloxacin (LEVAQUIN) IVPB 500 mg     500 mg 100 mL/hr over 60 Minutes Intravenous  Once 04/13/17 1255 04/13/17 1507       Subjective:  Still having significant scrotal pain.  Very weak and unable to stand up.  Denies SOB.  Appetite is good.  Bowels have been moving.  Still has foley catheter.    Objective: Vitals:   04/18/17 2152 04/18/17 2226 04/19/17 0543 04/19/17 1318  BP: 136/80  117/64 116/68  Pulse: (!) 58 62 (!) 53 65  Resp: _0 Temp: 98.9 F (37.2 C)  98.7 F (37.1 C) 98.9 F (37.2 C)  TempSrc: Oral   Oral Oral  SpO2: 97% 98% 98% 99%  Weight: (!) 178.4 kg (393 lb 4.8 oz)  (!) 179 kg (394 lb 10 oz)   Height:        Intake/Output Summary (Last 24 hours) at 04/19/2017 1534 Last data filed at 04/19/2017 1317 Gross per 24 hour  Intake 1070 ml  Output 2825 ml  Net -1755 ml   Filed Weights   04/17/17 0500 04/18/17 2152 04/19/17 0543  Weight: (!) 173.7 kg (382 lb 15 oz) (!) 178.4 kg (393 lb 4.8 oz) (!) 179 kg (394 lb 10 oz)    Examination:  General exam:  Adult male, obese.  No acute distress.  HEENT:  NCAT, MMM Respiratory system: Clear to auscultation bilaterally Cardiovascular system: Regular rate and rhythm, normal S1/S2. No murmurs, rubs, gallops or clicks.  Warm extremities Gastrointestinal system: Normal active bowel sounds, soft, nondistended, nontender. MSK:  Normal tone and bulk, no lower extremity edema Neuro:  Grossly intact GU:  Scrotum is less swollen, tender but less so than prior exams, and less warm to touch.  Scant dark drainage    Data Reviewed: I have personally reviewed following labs and imaging studies  CBC: Recent Labs  Lab 04/13/17 1143  04/15/17 1600 04/16/17 1145 04/17/17 1045 04/18/17 0403 04/19/17 0442  WBC 16.6*   < > 9.9 8.4 9.1 8.3 8.9  NEUTROABS 14.7*  --  6.7 5.2  --   --   --   HGB 9.0*   < > 7.4* 7.0* 7.5* 7.0* 7.2*  HCT 28.9*   < > 22.4* 21.9* 22.6* 21.5* 22.0*  MCV 95.1   < > 91.1 91.6 90.8 91.5 91.7  PLT 157   < > 132* 134* 140* 141* 159   < > = values in this interval not displayed.   Basic Metabolic Panel: Recent Labs  Lab 04/15/17 1600 04/16/17 1145 04/17/17 1045 04/18/17 0403 04/19/17 0442  NA 137 139 142 143 139  K 4.1 4.1 4.0 3.8 3.6  CL 106 110 110 111 109  CO2 _0 GLUCOSE 90 115* 74 122* 136*  BUN 35* 33* 28* 24* 21*  CREATININE 2.36* 2.18* 1.99* 2.08* 1.84*  CALCIUM 7.6* 7.7* 7.9* 7.6* 7.2*  MG  --  1.9 1.9  --   --    GFR: Estimated Creatinine Clearance: 75.3 mL/min (A) (by C-G formula based on  SCr of 1.84 mg/dL (H)). Liver Function Tests: No results for input(s): AST, ALT, ALKPHOS, BILITOT, PROT, ALBUMIN in the last 168 hours. No results for input(s): LIPASE, AMYLASE in the last 168 hours. No results for input(s): AMMONIA in the last 168 hours. Coagulation Profile: No results for input(s): INR, PROTIME in the last 168 hours. Cardiac Enzymes: No results for input(s): CKTOTAL, CKMB, CKMBINDEX, TROPONINI in the last 168 hours. BNP (last 3 results) No results for input(s): PROBNP in the last 8760 hours. HbA1C: No results for input(s): HGBA1C in the last 72 hours. CBG: Recent Labs  Lab 04/18/17 1152 04/18/17 1745 04/18/17 2149 04/19/17 0739 04/19/17 1136  GLUCAP 246* 200* 206* 130* 151*   Lipid Profile: No results for input(s): CHOL, HDL, LDLCALC, TRIG, CHOLHDL, LDLDIRECT in the last 72 hours. Thyroid Function Tests: No results for input(s): TSH, T4TOTAL, FREET4, T3FREE, THYROIDAB in the last 72 hours. Anemia Panel: No results for input(s): VITAMINB12, FOLATE, FERRITIN, TIBC, IRON, RETICCTPCT in the last 72 hours. Urine analysis:    Component Value Date/Time   COLORURINE YELLOW 04/13/2017 1118   APPEARANCEUR HAZY (A) 04/13/2017 1118   LABSPEC 1.009 04/13/2017 1118   PHURINE 5.0 04/13/2017 1118   GLUCOSEU NEGATIVE 04/13/2017 1118   HGBUR SMALL (A) 04/13/2017 1118   BILIRUBINUR NEGATIVE 04/13/2017 1118   KETONESUR NEGATIVE 04/13/2017 1118   PROTEINUR 100 (A) 04/13/2017 1118   UROBILINOGEN 0.2 10/03/2012 1809   NITRITE NEGATIVE 04/13/2017 1118   LEUKOCYTESUR MODERATE (A) 04/13/2017 1118   Sepsis Labs: _1 (procalcitonin:4,lacticidven:4)  ) Recent Results (from the past 240 hour(s))  Culture, blood (routine x 2)     Status: None   Collection Time: 04/13/17  6:17 PM  Result Value Ref Range Status   Specimen Description BLOOD LEFT HAND  Final   Special Requests   Final    BOTTLES DRAWN AEROBIC AND ANAEROBIC Blood Culture adequate volume   Culture NO  GROWTH 5 DAYS  Final   Report Status 04/18/2017 FINAL  Final  Culture, blood (routine x 2)     Status: None   Collection Time: 04/13/17  6:17 PM  Result Value Ref Range Status   Specimen Description LEFT ANTECUBITAL  Final   Special Requests   Final    BOTTLES DRAWN AEROBIC AND ANAEROBIC Blood Culture adequate volume   Culture NO GROWTH 5 DAYS  Final   Report Status 04/18/2017 FINAL  Final  MRSA PCR Screening     Status: None   Collection Time: 04/14/17  6:50 PM  Result Value Ref Range Status   MRSA by PCR NEGATIVE NEGATIVE Final    Comment:        The GeneXpert MRSA Assay (FDA approved for NASAL specimens only), is one component of a comprehensive MRSA colonization surveillance program. It is not intended to diagnose MRSA infection nor to guide or monitor treatment for MRSA infections.   Aerobic/Anaerobic Culture (surgical/deep wound)     Status: None   Collection Time: 04/14/17  8:15 PM  Result Value Ref Range Status   Specimen Description SCROTUM  Final   Special Requests NONE  Final   Gram Stain   Final    RARE WBC PRESENT, PREDOMINANTLY PMN ABUNDANT GRAM NEGATIVE RODS ABUNDANT GRAM POSITIVE COCCI Performed at [facility], [address] [place], [place] [address]    Culture   Final    MODERATE ENTEROCOCCUS AVIUM MIXED ANAEROBIC FLORA PRESENT.  CALL LAB IF FURTHER IID REQUIRED.    Report Status 04/19/2017 FINAL  Final   Organism ID, Bacteria ENTEROCOCCUS AVIUM  Final      Susceptibility   Enterococcus avium - MIC*    AMPICILLIN <=2 SENSITIVE Sensitive     VANCOMYCIN <=0.5 SENSITIVE Sensitive     GENTAMICIN SYNERGY SENSITIVE Sensitive     * MODERATE ENTEROCOCCUS AVIUM      Radiology Studies: No results found.   Scheduled Meds: . acetaminophen  650 mg Oral TID  . acyclovir  400 mg Oral q morning - 10a  . allopurinol  300 mg Oral Daily  . atorvastatin  80 mg Oral QHS  . calcitRIOL  0.25 mcg Oral Once per day on Mon Wed Fri  . Chlorhexidine  Gluconate Cloth  6 each Topical Daily  . colchicine  0.6 mg Oral Daily  . dexamethasone  10 mg Oral Q Fri-1800  . Eluxadoline  100 mg Oral BID WC  . gabapentin  300 mg Oral TID  . insulin aspart  0-9 Units Subcutaneous TID WC  . insulin aspart  5 Units Subcutaneous TID WC  . insulin detemir  30 Units Subcutaneous q morning - 10a  . mouth rinse  15 mL Mouth Rinse BID  . metoprolol succinate  25 mg Oral Daily  . multivitamin with minerals  1 tablet Oral Daily  . pantoprazole  40 mg Oral Daily  . povidone-iodine   Topical Daily  . rivaroxaban  20 mg Oral Q supper  . sulfamethoxazole-trimethoprim  1 tablet Oral Once per day on Mon Wed Fri  . torsemide  40 mg Oral Daily  . [START ON 04/20/2017] Vitamin D (Ergocalciferol)  50,000 Units Oral Q7 days   Continuous Infusions: . piperacillin-tazobactam (ZOSYN)  IV 3.375 g (04/19/17 1412)     LOS: 5 days    Time spent: 30 min    Janece Canterbury, MD [facility] Pager [phone]  If 7PM-7AM, please contact night-coverage [email or web address] Password [facility] 04/19/2017, 3:34  PM

## 2017-04-19 NOTE — Progress Notes (Signed)
Pharmacy Antibiotic Note  [name] is a 56 y.o. male admitted on 04/13/2017 with Fournier's gangrene.  Pharmacy has been consulted for antibiotic narrowing from Zosyn to Unasyn dosing for wound infection with pan-sensitive enterococcus avium and mixed anaerobic flora.  Plan:  Unasyn 3g IV q6h  Follow up renal fxn, culture results, and clinical course.   Height: 6' 2.5" (189.2 cm) Weight: (!) 394 lb 10 oz (179 kg) IBW/kg (Calculated) : 83.35  Temp (24hrs), Avg:99.1 F (37.3 C), Min:98.7 F (37.1 C), Max:99.9 F (37.7 C)  Recent Labs  Lab 04/15/17 1600 04/16/17 1145 04/17/17 1045 04/18/17 0403 04/19/17 0442  WBC 9.9 8.4 9.1 8.3 8.9  CREATININE 2.36* 2.18* 1.99* 2.08* 1.84*    Estimated Creatinine Clearance: 75.3 mL/min (A) (by C-G formula based on SCr of 1.84 mg/dL (H)).    No Known Allergies  Antimicrobials this admission: Cefepime 11/12>> Levaquin 11/12>>11/12 Doxy 11/12 >>11/13 Vanc  11/13 >> 11/16 Zosyn  11/13 >> 11/18 Unasyn 11/18 >>  Dose adjustments this admission: 11/14: changed Vanc from 1750mg  IV q24 to 1250mg  IV q24 using AUC calculator - est AUC 428 using SCr 2.36 11/15: changed vanc to 1500 mg q24h for improving SCr to maintain AUC 400-500  Microbiology results: 11/12BCx: NGF 11/13 MRSA PCR: negative 11/12 HIV: nonreactive 11/13 tissue cx (scrotum): moderate Enterococcus avium (pan-sensitive), mixed anaerobic flora (call lab for further ID)   Thank you for allowing pharmacy to be a part of this patient's care.  Gretta Arab PharmD, BCPS Pager [phone] 04/19/2017 3:47 PM

## 2017-04-19 NOTE — Anesthesia Postprocedure Evaluation (Signed)
Anesthesia Post Note  Patient: [name]  Procedure(s) Performed: DEBRIDEMENT SCROTAL WOUND AND DRESSING CHANGE (N/A )     Patient location during evaluation: PACU Anesthesia Type: General Level of consciousness: awake and alert Pain management: pain level controlled Vital Signs Assessment: post-procedure vital signs reviewed and stable Respiratory status: spontaneous breathing, nonlabored ventilation, respiratory function stable and patient connected to nasal cannula oxygen Cardiovascular status: blood pressure returned to baseline and stable Postop Assessment: no apparent nausea or vomiting Anesthetic complications: no    Last Vitals:  Vitals:   04/18/17 2226 04/19/17 0543  BP:  117/64  Pulse: 62 (!) 53  Resp: 17 18  Temp:  37.1 C  SpO2: 98% 98%    Last Pain:  Vitals:   04/19/17 0844  TempSrc:   PainSc: 6                  Fernanda Twaddell S

## 2017-04-19 NOTE — Progress Notes (Signed)
ANTICOAGULATION CONSULT NOTE - Initial Consult  Pharmacy Consult for Xarelto Indication: Afib  No Known Allergies  Patient Measurements: Height: 6' 2.5" (189.2 cm) Weight: (!) 394 lb 10 oz (179 kg) IBW/kg (Calculated) : 83.35  Vital Signs: Temp: 98.7 F (37.1 C) (11/18 0543) Temp Source: Oral (11/18 0543) BP: 117/64 (11/18 0543) Pulse Rate: 53 (11/18 0543)  Labs: Recent Labs    04/17/17 1045 04/18/17 0403 04/19/17 0442  HGB 7.5* 7.0* 7.2*  HCT 22.6* 21.5* 22.0*  PLT 140* 141* 159  CREATININE 1.99* 2.08* 1.84*    Estimated Creatinine Clearance: 75.3 mL/min (A) (by C-G formula based on SCr of 1.84 mg/dL (H)).   Medical History: Past Medical History:  Diagnosis Date  . Anemia   . Arteriosclerotic cardiovascular disease (ASCVD)    MI-2000s; stent to the proximal LAD and diagonal in 2001; stress nuclear in 2008-impaired exercise capacity, left ventricular dilatation, moderately to severely depressed EF, apical, inferior and anteroseptal scar  . Arthritis   . Atrial flutter ([place])   . 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 Cellulitis of leg    both legs  . Chronic diarrhea   . Chronic kidney disease, stage 3, mod decreased GFR (HCC)    Creatinine of 1.84 in 06/2011 and 1.5 in 07/2011  . Diabetes mellitus    Insulin  . Dysrhythmia    AFlutter  . GERD (gastroesophageal reflux disease)   . Gout   . Hyperlipidemia   . Hypertension   . Injection site reaction   . Multiple myeloma 07/01/2011  . Myocardial infarction ([place]) 2000  . Obesity   . Pedal edema    Venous insufficiency  . Sleep apnea    uses cpap  . Ulcer     Medications:  Scheduled:  . acetaminophen  650 mg Oral TID  . acyclovir  400 mg Oral q morning - 10a  . allopurinol  300 mg Oral Daily  . atorvastatin  80 mg Oral QHS  . calcitRIOL  0.25 mcg Oral Once per day on Mon Wed Fri  . Chlorhexidine Gluconate Cloth  6 each Topical Daily  . colchicine  0.6 mg Oral Daily  . dexamethasone  10 mg Oral Q  Fri-1800  . Eluxadoline  100 mg Oral BID WC  . gabapentin  300 mg Oral TID  . insulin aspart  0-9 Units Subcutaneous TID WC  . insulin aspart  5 Units Subcutaneous TID WC  . insulin detemir  30 Units Subcutaneous q morning - 10a  . mouth rinse  15 mL Mouth Rinse BID  . metoprolol succinate  25 mg Oral Daily  . multivitamin with minerals  1 tablet Oral Daily  . pantoprazole  40 mg Oral Daily  . povidone-iodine   Topical Daily  . sulfamethoxazole-trimethoprim  1 tablet Oral Once per day on Mon Wed Fri  . torsemide  40 mg Oral Daily  . [START ON 04/20/2017] Vitamin D (Ergocalciferol)  50,000 Units Oral Q7 days   Infusions:  . piperacillin-tazobactam (ZOSYN)  IV 3.375 g (04/19/17 0525)    Assessment: Noted PTA Xarelto for afib on hold d/t recent surgical procedures. Now to restart Xarelto 11/18 per Md orders. CBC stable. SCr 1.84 CrCl 75. No reported bleeding  Plan:  1) Discontinue Lovenox - not last dose of 91m given 11/17 at 1600 2) Restart patient's home Xarelto at 271monce daily to start this PM  JuAdrian SaranPharmD, BCPS Pager [phone]/18/2018 8:19 AM

## 2017-04-20 LAB — TYPE AND SCREEN
ABO/RH(D): O POS
Antibody Screen: NEGATIVE
Unit division: 0
Unit division: 0

## 2017-04-20 LAB — LACTATE DEHYDROGENASE: LDH: 189 U/L (ref 98–192)

## 2017-04-20 LAB — BPAM RBC
Blood Product Expiration Date: 201812052359
Blood Product Expiration Date: 201812062359
ISSUE DATE / TIME: 201811160237
ISSUE DATE / TIME: 201811171145
Unit Type and Rh: 5100
Unit Type and Rh: 5100

## 2017-04-20 LAB — RETICULOCYTES
RBC.: 2.36 MIL/uL — ABNORMAL LOW (ref 4.22–5.81)
Retic Count, Absolute: 28.3 10*3/uL (ref 19.0–186.0)
Retic Ct Pct: 1.2 % (ref 0.4–3.1)

## 2017-04-20 LAB — FOLATE: Folate: 16.1 ng/mL (ref >5.9)

## 2017-04-20 LAB — GLUCOSE, CAPILLARY
Glucose-Capillary: 68 mg/dL (ref 65–99)
Glucose-Capillary: 109 mg/dL — ABNORMAL HIGH (ref 65–99)
Glucose-Capillary: 116 mg/dL — ABNORMAL HIGH (ref 65–99)
Glucose-Capillary: 94 mg/dL (ref 65–99)
Glucose-Capillary: 105 mg/dL — ABNORMAL HIGH (ref 65–99)

## 2017-04-20 LAB — FERRITIN: Ferritin: 79 ng/mL (ref 24–336)

## 2017-04-20 LAB — VITAMIN B12: Vitamin B-12: 463 pg/mL (ref 180–914)

## 2017-04-20 LAB — PREPARE RBC (CROSSMATCH)

## 2017-04-20 MED ORDER — INSULIN DETEMIR 100 UNIT/ML ~~LOC~~ SOLN
20.0000 [IU] | Freq: Every morning | SUBCUTANEOUS | Status: DC
  Filled 2017-04-20: qty 0.2

## 2017-04-20 MED ORDER — SODIUM CHLORIDE 0.9 % IV SOLN
Freq: Once | INTRAVENOUS | Status: AC
  Administered 2017-04-20: 21:00:00 via INTRAVENOUS

## 2017-04-20 MED ORDER — SODIUM CHLORIDE 0.9 % IV SOLN
510.0000 mg | Freq: Once | INTRAVENOUS | Status: DC
  Filled 2017-04-20: qty 17

## 2017-04-20 MED FILL — Morphine Sulfate Inj 10 MG/ML: INTRAMUSCULAR | Qty: 1 | Status: AC

## 2017-04-20 NOTE — Progress Notes (Signed)
Urology  The patient is currently on the toilet.  I will stop by tomorrow to assess his scrotal wound.  Continue daily dressing changes.  Will plan on removing the penrose drains in one week either in the hospital or in the outpatient setting.   Ellison Hughs, MD [facility]

## 2017-04-20 NOTE — Progress Notes (Signed)
OT Cancellation Note  Patient Details Name: [name] MRN: [id number] DOB: [birth year]   Cancelled Treatment:    Reason Eval/Treat Not Completed: Other (comment). Pt preparing to eat lunch and requested OT return later  [name] 04/20/2017, 2:02 PM

## 2017-04-20 NOTE — Progress Notes (Signed)
PROGRESS NOTE  [name]  JOI:[id number] DOB: March 29, 1961 DOA: 04/13/2017 PCP: [name], MD  Brief Narrative:   56 year old male with history of coronary artery disease, chronic systolic heart failure with ejection fraction 35-40%, paroxysmal atrial flutter on Xarelto, CKD stage III, chronic diarrhea, multiple myeloma, OSA on CPAP, and previous history of epididymitis who presented with scrotal pain.  He had a laceration to the posterior wall of the scrotum while sliding down from his bed a few days prior to admission.  He developed worsening pain without fever, chills.  Ultrasound in the emergency department and demonstrated a prominent epididymis on the left.  The patient was also found to have subcutaneous air on exam.  Urology was consulted and the patient was started on IV antibiotics.  He was transferred from [facility] for Fournier's gangrene and he underwent his first debridement on 11/13, second debridement on 11/14, and final debridement with closure on 11/16.  Culture grew enterococcus avium which is pansensitive and mixed anaerobic flora.  His antibiotics were narrowed to Unasyn.  His MRSA PCR was negative.  He is very weak and needs dressing changes and awaiting SNF placement.  Insurance pending.    Assessment & Plan:   Principal Problem:   Fournier gangrene Active Problems:   Hypertension   Atrial flutter (HCC)   Anemia, normocytic normochromic   Epididymitis   Acute epididymitis  Severe Sepsis secondary to Fournier's Gangrene of the Scrotum, pain improving -11/13 Debridement scrotal abscess -11/14 Second debridement scrotal abscess - 11/16 Third debridement with closure and placement of penrose drains - Blood cultures NGTD - OR cultures grew enterococcus avium and mixed anaerobic flora -  Continue nasyn -  Mobilize with PT/OT -  d/c IV dilaudid -  Continue oxycodone 15 mg q3h prn -  Schedule tylenol -  Increased gabapentin to BID (due to CrCl) on  11/18 -  Unable to start NSAID due to CKD  Diabetes type 2 uncontrolled with complications, CBG mildly low this morning, well-controlled this afternoon -11/1 Hemoglobin A1c= 8.2 - decrease to Levemir to 20 units - Continue Sensitive SSI -  continue aspart 5 units with meals  Normocytic anemia with decrease in hemoglobin due to recent surgery (acute blood loss) and serious infection in the setting of multiple myeloma.  No obvious hemorrhage -11/15 transfuse 1 unit. PRBC - 11/16 transfused 1 unit PRBC -  Ferritin, TSH, folate, b12 normal, but retic very low 2/2 MM and CKD -  Spoke with Oncology > transfuse to hemoglobin of 10g/dl and they will see back in clinic on 12/7 at 11AM  Acute on CKD stage III (baseline Cr 1.7 -1.9 ) -  Creatinine back to baseline -  Holding sodium bicarb as CO2 is stable  Chronic Systolic CHF (EF 47-09%) - Wt approximately stable -  Continue torsemide -  Continue metoprolol -  Holding ACEI  HTN -Toprol 25 mg daily   CAD -Lipitor 80 mg daily  Paroxysmal atrial fibrillation - continue xarelto  Multiple myeloma -Continue decadron, eluxadoline, revlimid, bactrim, acyclovir   DVT prophylaxis:  xarelto Code Status:  Full code Family Communication:  Patient and his wife at bedside Disposition Plan:  Continue IV antibiotics.   Will need SNF at discharge.  Awaiting insurance approval.  Will likely transition to augmentin at discharge.  Consultants:  Urology Dr. Conception Oms Winter   Procedures:  11/13 S/P I&D necrotic scrotal skin and drainage of scrotal abscess 11/14 S/P I&D necrotic scrotal skin with packing change.  11/15 transfused 1 unit. PRBC 11/16 transfused 1 unit PRBC  Antimicrobials:  Anti-infectives (From admission, onward)   Start     Dose/Rate Route Frequency Ordered Stop   04/19/17 2200  Ampicillin-Sulbactam (UNASYN) 3 g in sodium chloride 0.9 % 100 mL IVPB     3 g 200 mL/hr over 30 Minutes Intravenous Every 6 hours  04/19/17 1610     04/16/17 1500  vancomycin (VANCOCIN) 1,500 mg in sodium chloride 0.9 % 500 mL IVPB  Status:  Discontinued     1,500 mg 250 mL/hr over 120 Minutes Intravenous Every 24 hours 04/16/17 1356 04/17/17 1343   04/15/17 1400  vancomycin (VANCOCIN) 1,250 mg in sodium chloride 0.9 % 250 mL IVPB  Status:  Discontinued     1,250 mg 166.7 mL/hr over 90 Minutes Intravenous Every 24 hours 04/15/17 0911 04/16/17 1356   04/15/17 1300  vancomycin (VANCOCIN) 1,750 mg in sodium chloride 0.9 % 500 mL IVPB  Status:  Discontinued     1,750 mg 250 mL/hr over 120 Minutes Intravenous Every 24 hours 04/14/17 1319 04/15/17 0911   04/15/17 0900  sulfamethoxazole-trimethoprim (BACTRIM DS,SEPTRA DS) 800-160 MG per tablet 1 tablet     1 tablet Oral Once per day on Mon Wed Fri 04/13/17 1412     04/14/17 1944  piperacillin-tazobactam (ZOSYN) 3.375 GM/50ML IVPB    Comments:  Williford, Peggy   : cabinet override      04/14/17 1944 04/14/17 2024   04/14/17 1630  clindamycin (CLEOCIN) IVPB 900 mg  Status:  Discontinued     900 mg 100 mL/hr over 30 Minutes Intravenous Every 8 hours 04/14/17 1611 04/14/17 2246   04/14/17 1400  piperacillin-tazobactam (ZOSYN) IVPB 3.375 g  Status:  Discontinued     3.375 g 12.5 mL/hr over 240 Minutes Intravenous Every 8 hours 04/14/17 1309 04/19/17 1544   04/14/17 1315  vancomycin (VANCOCIN) IVPB 1000 mg/200 mL premix     1,000 mg 200 mL/hr over 60 Minutes Intravenous Every 1 hr x 2 04/14/17 1313 04/14/17 1836   04/14/17 1000  acyclovir (ZOVIRAX) tablet 400 mg     400 mg Oral  Every morning - 10a 04/13/17 1412     04/13/17 2000  ceFEPIme (MAXIPIME) 2 g in dextrose 5 % 50 mL IVPB  Status:  Discontinued     2 g 100 mL/hr over 30 Minutes Intravenous Every 24 hours 04/13/17 1716 04/14/17 1309   04/13/17 1800  doxycycline (VIBRA-TABS) tablet 100 mg  Status:  Discontinued     100 mg Oral Every 12 hours 04/13/17 1648 04/14/17 1309   04/13/17 1300  levofloxacin (LEVAQUIN) IVPB 500  mg     500 mg 100 mL/hr over 60 Minutes Intravenous  Once 04/13/17 1255 04/13/17 1507       Subjective:  Scrotal pain is improving.  Thinks his strength is improving but declined to work with physical therapy.  Denies nausea, SOB.  Bowels are moving.    Objective: Vitals:   04/19/17 2218 04/20/17 0452 04/20/17 0728 04/20/17 1159  BP: 125/66 123/69  127/73  Pulse: 64 61  63  Resp: _0 Temp: 98.4 F (36.9 C) 98 F (36.7 C)  (!) 94 F (34.4 C)  TempSrc: Oral Oral  Oral  SpO2: 96% 97%  94%  Weight:   (!) 178 kg (392 lb 6.7 oz)   Height:        Intake/Output Summary (Last 24 hours) at 04/20/2017 1529 Last data filed at 04/20/2017 1203  Gross per 24 hour  Intake 440 ml  Output 1050 ml  Net -610 ml   Filed Weights   04/18/17 2152 04/19/17 0543 04/20/17 0728  Weight: (!) 178.4 kg (393 lb 4.8 oz) (!) 179 kg (394 lb 10 oz) (!) 178 kg (392 lb 6.7 oz)    Examination:  General exam:  Adult male, obese.  No acute distress.  HEENT:  NCAT, MMM Respiratory system: Clear to auscultation bilaterally Cardiovascular system: Regular rate and rhythm, normal S1/S2. No murmurs, rubs, gallops or clicks.  Warm extremities Gastrointestinal system: Normal active bowel sounds, soft, nondistended, nontender. GU:  Swelling of scrotum continues to improve.  Erythema is improving, less tender and warm to touch MSK:  Normal tone and bulk, no lower extremity edema Neuro:  Grossly intact  Data Reviewed: I have personally reviewed following labs and imaging studies  CBC: Recent Labs  Lab 04/15/17 1600 04/16/17 1145 04/17/17 1045 04/18/17 0403 04/19/17 0442  WBC 9.9 8.4 9.1 8.3 8.9  NEUTROABS 6.7 5.2  --   --   --   HGB 7.4* 7.0* 7.5* 7.0* 7.2*  HCT 22.4* 21.9* 22.6* 21.5* 22.0*  MCV 91.1 91.6 90.8 91.5 91.7  PLT 132* 134* 140* 141* 229   Basic Metabolic Panel: Recent Labs  Lab 04/15/17 1600 04/16/17 1145 04/17/17 1045 04/18/17 0403 04/19/17 0442  NA 137 139 142 143 139  K  4.1 4.1 4.0 3.8 3.6  CL 106 110 110 111 109  CO2 _0 GLUCOSE 90 115* 74 122* 136*  BUN 35* 33* 28* 24* 21*  CREATININE 2.36* 2.18* 1.99* 2.08* 1.84*  CALCIUM 7.6* 7.7* 7.9* 7.6* 7.2*  MG  --  1.9 1.9  --   --    GFR: Estimated Creatinine Clearance: 75 mL/min (A) (by C-G formula based on SCr of 1.84 mg/dL (H)). Liver Function Tests: No results for input(s): AST, ALT, ALKPHOS, BILITOT, PROT, ALBUMIN in the last 168 hours. No results for input(s): LIPASE, AMYLASE in the last 168 hours. No results for input(s): AMMONIA in the last 168 hours. Coagulation Profile: No results for input(s): INR, PROTIME in the last 168 hours. Cardiac Enzymes: No results for input(s): CKTOTAL, CKMB, CKMBINDEX, TROPONINI in the last 168 hours. BNP (last 3 results) No results for input(s): PROBNP in the last 8760 hours. HbA1C: No results for input(s): HGBA1C in the last 72 hours. CBG: Recent Labs  Lab 04/19/17 1650 04/19/17 2224 04/20/17 0757 04/20/17 1147 04/20/17 1315  GLUCAP 123* 132* 68 109* 116*   Lipid Profile: No results for input(s): CHOL, HDL, LDLCALC, TRIG, CHOLHDL, LDLDIRECT in the last 72 hours. Thyroid Function Tests: No results for input(s): TSH, T4TOTAL, FREET4, T3FREE, THYROIDAB in the last 72 hours. Anemia Panel: Recent Labs    04/20/17 0307  VITAMINB12 463  FOLATE 16.1  FERRITIN 79  RETICCTPCT 1.2   Urine analysis:    Component Value Date/Time   COLORURINE YELLOW 04/13/2017 1118   APPEARANCEUR HAZY (A) 04/13/2017 1118   LABSPEC 1.009 04/13/2017 1118   PHURINE 5.0 04/13/2017 1118   GLUCOSEU NEGATIVE 04/13/2017 1118   HGBUR SMALL (A) 04/13/2017 1118   BILIRUBINUR NEGATIVE 04/13/2017 1118   KETONESUR NEGATIVE 04/13/2017 1118   PROTEINUR 100 (A) 04/13/2017 1118   UROBILINOGEN 0.2 10/03/2012 1809   NITRITE NEGATIVE 04/13/2017 1118   LEUKOCYTESUR MODERATE (A) 04/13/2017 1118   Sepsis Labs: _1 (procalcitonin:4,lacticidven:4)  ) Recent Results (from  the past 240 hour(s))  Culture, blood (routine x 2)  Status: None   Collection Time: 04/13/17  6:17 PM  Result Value Ref Range Status   Specimen Description BLOOD LEFT HAND  Final   Special Requests   Final    BOTTLES DRAWN AEROBIC AND ANAEROBIC Blood Culture adequate volume   Culture NO GROWTH 5 DAYS  Final   Report Status 04/18/2017 FINAL  Final  Culture, blood (routine x 2)     Status: None   Collection Time: 04/13/17  6:17 PM  Result Value Ref Range Status   Specimen Description LEFT ANTECUBITAL  Final   Special Requests   Final    BOTTLES DRAWN AEROBIC AND ANAEROBIC Blood Culture adequate volume   Culture NO GROWTH 5 DAYS  Final   Report Status 04/18/2017 FINAL  Final  MRSA PCR Screening     Status: None   Collection Time: 04/14/17  6:50 PM  Result Value Ref Range Status   MRSA by PCR NEGATIVE NEGATIVE Final    Comment:        The GeneXpert MRSA Assay (FDA approved for NASAL specimens only), is one component of a comprehensive MRSA colonization surveillance program. It is not intended to diagnose MRSA infection nor to guide or monitor treatment for MRSA infections.   Aerobic/Anaerobic Culture (surgical/deep wound)     Status: None   Collection Time: 04/14/17  8:15 PM  Result Value Ref Range Status   Specimen Description SCROTUM  Final   Special Requests NONE  Final   Gram Stain   Final    RARE WBC PRESENT, PREDOMINANTLY PMN ABUNDANT GRAM NEGATIVE RODS ABUNDANT GRAM POSITIVE COCCI Performed at [facility], [address] [place], [place] [address]    Culture   Final    MODERATE ENTEROCOCCUS AVIUM MIXED ANAEROBIC FLORA PRESENT.  CALL LAB IF FURTHER IID REQUIRED.    Report Status 04/19/2017 FINAL  Final   Organism ID, Bacteria ENTEROCOCCUS AVIUM  Final      Susceptibility   Enterococcus avium - MIC*    AMPICILLIN <=2 SENSITIVE Sensitive     VANCOMYCIN <=0.5 SENSITIVE Sensitive     GENTAMICIN SYNERGY SENSITIVE Sensitive     * MODERATE ENTEROCOCCUS  AVIUM      Radiology Studies: No results found.   Scheduled Meds: . acetaminophen  650 mg Oral TID  . acyclovir  400 mg Oral q morning - 10a  . allopurinol  300 mg Oral Daily  . atorvastatin  80 mg Oral QHS  . calcitRIOL  0.25 mcg Oral Once per day on Mon Wed Fri  . Chlorhexidine Gluconate Cloth  6 each Topical Daily  . colchicine  0.6 mg Oral Daily  . dexamethasone  10 mg Oral Q Fri-1800  . Eluxadoline  100 mg Oral BID WC  . gabapentin  300 mg Oral BID  . insulin aspart  0-9 Units Subcutaneous TID WC  . insulin aspart  5 Units Subcutaneous TID WC  . insulin detemir  30 Units Subcutaneous q morning - 10a  . mouth rinse  15 mL Mouth Rinse BID  . metoprolol succinate  25 mg Oral Daily  . multivitamin with minerals  1 tablet Oral Daily  . pantoprazole  40 mg Oral Daily  . povidone-iodine   Topical Daily  . rivaroxaban  20 mg Oral Q supper  . sulfamethoxazole-trimethoprim  1 tablet Oral Once per day on Mon Wed Fri  . torsemide  40 mg Oral Daily  . Vitamin D (Ergocalciferol)  50,000 Units Oral Q7 days   Continuous  Infusions: . ampicillin-sulbactam (UNASYN) IV Stopped (04/20/17 1259)  . ferumoxytol       LOS: 6 days    Time spent: 30 min    Janece Canterbury, MD [facility] Pager [phone]  If 7PM-7AM, please contact night-coverage [email or web address] Password [email or web address] 04/20/2017, 3:29 PM

## 2017-04-20 NOTE — Progress Notes (Signed)
Physical Therapy Treatment Patient Details Name: [name] MRN: [id number] DOB: 10/03/1960 Today's Date: 04/20/2017    History of Present Illness [name] is a 56 y.o. male with PMH of PAF on (xarelto), HTN, DM, CKD, Multiple myeloma, previous h/o epidermitis presented with scrotal pain. Patient states that he was not able to ambulate well due to his recent toe surgery. Admitted with scrotal  pain and swelling. S/P I and D for Fourniere's gangrene.    PT Comments    The  Patient is requiring  Less assist for mobility but does require 2 for safety. The patient reports use of Darco shoe on right but it is not available.  Continue PT.    Follow Up Recommendations  SNF;Supervision/Assistance - 24 hour     Equipment Recommendations  Rolling walker with 5" wheels    Recommendations for Other Services       Precautions / Restrictions Precautions Precautions: Fall Precaution Comments: scrotal wound Required Braces or Orthoses: Other Brace/Splint Other Brace/Splint: Darco shoe after  right toe surgery    Mobility  Bed Mobility Overal bed mobility: Needs Assistance Bed Mobility: Supine to Sit     Supine to sit: Mod assist     General bed mobility comments: Assist for legs and trunk to sitting up onto bed edge  Transfers Overall transfer level: Needs assistance Equipment used: Rolling walker (2 wheeled) Transfers: Sit to/from [facility] Sit to Stand: Max assist;+2 physical assistance;+2 safety/equipment;From elevated surface Stand pivot transfers: Min assist       General transfer comment: assist to rise from bed and BSC, pivot steps with RW to [facility] then to recliner.  Ambulation/Gait                 Stairs            Wheelchair Mobility    Modified Rankin (Stroke Patients Only)       Balance                                            Cognition Arousal/Alertness: Awake/alert                                             Exercises      General Comments        Pertinent Vitals/Pain Faces Pain Scale: Hurts whole lot Pain Location: over scrotum when sitting up Pain Descriptors / Indicators: Pressure;Grimacing;Guarding;Sharp Pain Intervention(s): Repositioned;Premedicated before session;Monitored during session    Home Living                      Prior Function            PT Goals (current goals can now be found in the care plan section) Progress towards PT goals: Progressing toward goals    Frequency    Min 3X/week      PT Plan Current plan remains appropriate    Co-evaluation              AM-PAC PT "6 Clicks" Daily Activity  Outcome Measure  Difficulty turning over in bed (including adjusting bedclothes, sheets and blankets)?: Unable Difficulty moving from lying on back to sitting on the side of the bed? : Unable Difficulty sitting  down on and standing up from a chair with arms (e.g., wheelchair, bedside commode, etc,.)?: Unable Help needed moving to and from a bed to chair (including a wheelchair)?: Total Help needed walking in hospital room?: Total Help needed climbing 3-5 steps with a railing? : Total 6 Click Score: 6    End of Session   Activity Tolerance: Patient limited by pain Patient left: in chair;with call bell/phone within reach;with family/visitor present Nurse Communication: Mobility status PT Visit Diagnosis: Unsteadiness on feet (R26.81);Other abnormalities of gait and mobility (R26.89);Muscle weakness (generalized) (M62.81)     Time: 3174-0992 PT Time Calculation (min) (ACUTE ONLY): 29 min  Charges:  $Therapeutic Activity: 23-37 mins                    G CodesTresa Endo PT [phone]    Claretha Cooper 04/20/2017, 4:20 PM

## 2017-04-20 NOTE — Consult Note (Signed)
[facility] Nurse wound consult note Reason for Consult: Patient with report of new open area on buttocks, two, with L>R.  Upper left buttock with two partial thickness tissue loss areas. Wound type:Friction, moisture Pressure Injury POA: N/A Measurement: Left upper buttock with linear area of tissue loss (friction) measuring 1.4cm x 4cm x 2cm. Bilateral buttocks at gluteal cleft with full thickness tissue loss on the left measuring 2cm x 2.5cm x 0.2cm and partial thickness tissue loss on the right measuring 1.5cm x 1cm x 0.1cm Wound PNP:YYFR, moist Drainage (amount, consistency, odor) scant serous Periwound:intact, dry Dressing procedure/placement/frequency: Patient was on a therapeutic mattress while in ICU and while the sleep surface on the floor is therapeutic, it does not provide the low air loss feature.  I will provide a bariatric sleep surface with low air loss surface today.  Topical wound care to the above mentioned areas will be the silicone foam dressings to support turning and repositioning. Our house antimicrobial textile will be provided for moisture wicking from the intertriginous areas of the subpannicular and bilateral inguinal areas.  [facility] nursing team will not follow, but will remain available to this patient, the nursing and medical teams.  Please re-consult if needed. Thanks, Maudie Flakes, MSN, RN, [place], Arther Abbott  Pager# [phone]

## 2017-04-20 NOTE — Progress Notes (Addendum)
CSW met with patient and spouse at bedside. CSW provided list of bed offers and explain SNF process.   Patient presented unsure of "best discharge plan", Home vs. 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  Patient spouse expressed concerns with the patient retuning home due to extent of his wound and weakness at this time.  CSW encouraged family to visit facilities that have made offer and are in network with [place], and make a choice. CSW also inform RNCM that patient wants to know information about Home Health services.   Plan: PT will meet with patient today.  Patient spouse will inform CSW of plan, and SNF choice if preferred.   CSW inform patient and spouse BCBS will need to authorize reahb at [facility]  and the process can take up to 72 hours after SNF is chosen.   CSW inform physician.   CSW will continue to follow for d/c needs.    Kathrin Greathouse, Latanya Presser, MSW Clinical Social Worker  [phone] 04/20/2017  10:37 AM

## 2017-04-21 ENCOUNTER — Other Ambulatory Visit (HOSPITAL_COMMUNITY): Payer: Self-pay | Admitting: Oncology

## 2017-04-21 DIAGNOSIS — C9001 Multiple myeloma in remission: Secondary | ICD-10-CM

## 2017-04-21 LAB — CBC
HCT: 31.1 % — ABNORMAL LOW (ref 39.0–52.0)
HCT: 31.2 % — ABNORMAL LOW (ref 39.0–52.0)
Hemoglobin: 10 g/dL — ABNORMAL LOW (ref 13.0–17.0)
Hemoglobin: 10 g/dL — ABNORMAL LOW (ref 13.0–17.0)
MCH: 29.8 pg (ref 26.0–34.0)
MCH: 29.6 pg (ref 26.0–34.0)
MCHC: 32.2 g/dL (ref 30.0–36.0)
MCHC: 32.1 g/dL (ref 30.0–36.0)
MCV: 92.6 fL (ref 78.0–100.0)
MCV: 92.3 fL (ref 78.0–100.0)
Platelets: 239 10*3/uL (ref 150–400)
Platelets: 225 10*3/uL (ref 150–400)
RBC: 3.36 MIL/uL — ABNORMAL LOW (ref 4.22–5.81)
RBC: 3.38 MIL/uL — ABNORMAL LOW (ref 4.22–5.81)
RDW: 17.1 % — ABNORMAL HIGH (ref 11.5–15.5)
RDW: 17 % — ABNORMAL HIGH (ref 11.5–15.5)
WBC: 9.4 10*3/uL (ref 4.0–10.5)
WBC: 9.9 10*3/uL (ref 4.0–10.5)

## 2017-04-21 LAB — GLUCOSE, CAPILLARY
Glucose-Capillary: 59 mg/dL — ABNORMAL LOW (ref 65–99)
Glucose-Capillary: 79 mg/dL (ref 65–99)
Glucose-Capillary: 124 mg/dL — ABNORMAL HIGH (ref 65–99)
Glucose-Capillary: 91 mg/dL (ref 65–99)
Glucose-Capillary: 107 mg/dL — ABNORMAL HIGH (ref 65–99)

## 2017-04-21 LAB — BASIC METABOLIC PANEL
Anion gap: 7 (ref 5–15)
BUN: 14 mg/dL (ref 6–20)
CO2: 25 mmol/L (ref 22–32)
Calcium: 7.6 mg/dL — ABNORMAL LOW (ref 8.9–10.3)
Chloride: 110 mmol/L (ref 101–111)
Creatinine, Ser: 1.58 mg/dL — ABNORMAL HIGH (ref 0.61–1.24)
GFR calc Af Amer: 54 mL/min — ABNORMAL LOW (ref >60)
GFR calc non Af Amer: 47 mL/min — ABNORMAL LOW (ref >60)
Glucose, Bld: 90 mg/dL (ref 65–99)
Potassium: 3.5 mmol/L (ref 3.5–5.1)
Sodium: 142 mmol/L (ref 135–145)

## 2017-04-21 LAB — HAPTOGLOBIN: Haptoglobin: 213 mg/dL — ABNORMAL HIGH (ref 34–200)

## 2017-04-21 MED ORDER — INSULIN DETEMIR 100 UNIT/ML ~~LOC~~ SOLN
15.0000 [IU] | Freq: Every morning | SUBCUTANEOUS | Status: DC
  Administered 2017-04-21: 15 [IU] via SUBCUTANEOUS
  Filled 2017-04-21 (×2): qty 0.15

## 2017-04-21 MED ORDER — INSULIN ASPART 100 UNIT/ML ~~LOC~~ SOLN: 3.0000 [IU] | Freq: Three times a day (TID) | SUBCUTANEOUS | Status: DC

## 2017-04-21 MED ORDER — LENALIDOMIDE 5 MG PO CAPS: ORAL_CAPSULE | ORAL | 0 refills | Status: DC

## 2017-04-21 NOTE — Progress Notes (Signed)
PROGRESS NOTE  [name]  QIW:[id number] DOB: 01-30-1961 DOA: 04/13/2017 PCP: [name], MD  Brief Narrative:   56 year old male with history of coronary artery disease, chronic systolic heart failure with ejection fraction 35-40%, paroxysmal atrial flutter on Xarelto, CKD stage III, chronic diarrhea, multiple myeloma, OSA on CPAP, and previous history of epididymitis who presented with scrotal pain.  He had a laceration to the posterior wall of the scrotum while sliding down from his bed a few days prior to admission.  He developed worsening pain without fever, chills.  Ultrasound in the emergency department and demonstrated a prominent epididymis on the left.  The patient was also found to have subcutaneous air on exam.  Urology was consulted and the patient was started on IV antibiotics.  He was transferred from [facility] for Fournier's gangrene and he underwent his first debridement on 11/13, second debridement on 11/14, and final debridement with closure on 11/16.  Culture grew enterococcus avium which is pansensitive and mixed anaerobic flora.  His antibiotics were narrowed to Unasyn.  His MRSA PCR was negative.  He is very weak and needs dressing changes and awaiting SNF placement.  Insurance pending.    Assessment & Plan:   Principal Problem:   Fournier gangrene Active Problems:   Hypertension   Atrial flutter (HCC)   Anemia, normocytic normochromic   Epididymitis   Acute epididymitis  Severe Sepsis secondary to Fournier's Gangrene of the Scrotum, pain improving -11/13 Debridement scrotal abscess -11/14 Second debridement scrotal abscess - 11/16 Third debridement with closure and placement of penrose drains - Blood cultures NGTD - OR cultures grew enterococcus avium and mixed anaerobic flora -  Continue unasyn -  Mobilize with PT/OT -  Continue oxycodone 15 mg q3h prn -  Schedule tylenol -  Continue gabapentin to BID (due to CrCl) on 11/18 -  Unable to  start NSAID due to CKD -  Dr. Lovena Neighbours to reassess wound later today  Diabetes type 2 uncontrolled with complications, CBG mildly low this morning, well-controlled this afternoon -11/1 Hemoglobin A1c= 8.2 - decrease to Levemir to 15 units - Continue Sensitive SSI -  Decrease to aspart 3 units with meals  Normocytic anemia with decrease in hemoglobin due to recent surgery (acute blood loss) and serious infection in the setting of multiple myeloma.  No obvious hemorrhage -11/15 transfuse 1 unit. PRBC - 11/16 transfused 1 unit PRBC -  Ferritin, TSH, folate, b12 normal, but retic very low 2/2 MM and CKD -  Spoke with Oncology > transfuse to hemoglobin of 10g/dl and they will see back in clinic on 12/7 at 11AM -  F/u CBC  Acute on CKD stage III (baseline Cr 1.7 -1.9 ) -  Creatinine back to baseline -  Holding sodium bicarb as CO2 is stable  Chronic Systolic CHF (EF 41-74%) - Wt approximately stable -  Continue torsemide -  Continue metoprolol -  Holding ACEI  HTN -Toprol 25 mg daily   CAD -Lipitor 80 mg daily  Paroxysmal atrial fibrillation - continue xarelto  Multiple myeloma -Continue decadron, eluxadoline, bactrim, acyclovir -  Holding revlimid until antibiotics are complete per Oncology   DVT prophylaxis:  xarelto Code Status:  Full code Family Communication:  Patient and his wife at bedside Disposition Plan:  Continue IV antibiotics.   Dr. Lovena Neighbours to reexamine again this evening.  Plan to discharge to SNF tomorrow (Wed)  Consultants:  Urology Dr. Conception Oms Winter   Procedures:  11/13 S/P I&D necrotic  scrotal skin and drainage of scrotal abscess 11/14 S/P I&D necrotic scrotal skin with packing change. 11/15 transfused 1 unit. PRBC 11/16 transfused 1 unit PRBC  Antimicrobials:  Anti-infectives (From admission, onward)   Start     Dose/Rate Route Frequency Ordered Stop   04/19/17 2200  Ampicillin-Sulbactam (UNASYN) 3 g in sodium chloride 0.9 % 100  mL IVPB     3 g 200 mL/hr over 30 Minutes Intravenous Every 6 hours 04/19/17 1610     04/16/17 1500  vancomycin (VANCOCIN) 1,500 mg in sodium chloride 0.9 % 500 mL IVPB  Status:  Discontinued     1,500 mg 250 mL/hr over 120 Minutes Intravenous Every 24 hours 04/16/17 1356 04/17/17 1343   04/15/17 1400  vancomycin (VANCOCIN) 1,250 mg in sodium chloride 0.9 % 250 mL IVPB  Status:  Discontinued     1,250 mg 166.7 mL/hr over 90 Minutes Intravenous Every 24 hours 04/15/17 0911 04/16/17 1356   04/15/17 1300  vancomycin (VANCOCIN) 1,750 mg in sodium chloride 0.9 % 500 mL IVPB  Status:  Discontinued     1,750 mg 250 mL/hr over 120 Minutes Intravenous Every 24 hours 04/14/17 1319 04/15/17 0911   04/15/17 0900  sulfamethoxazole-trimethoprim (BACTRIM DS,SEPTRA DS) 800-160 MG per tablet 1 tablet     1 tablet Oral Once per day on Mon Wed Fri 04/13/17 1412     04/14/17 1944  piperacillin-tazobactam (ZOSYN) 3.375 GM/50ML IVPB    Comments:  Williford, Peggy   : cabinet override      04/14/17 1944 04/14/17 2024   04/14/17 1630  clindamycin (CLEOCIN) IVPB 900 mg  Status:  Discontinued     900 mg 100 mL/hr over 30 Minutes Intravenous Every 8 hours 04/14/17 1611 04/14/17 2246   04/14/17 1400  piperacillin-tazobactam (ZOSYN) IVPB 3.375 g  Status:  Discontinued     3.375 g 12.5 mL/hr over 240 Minutes Intravenous Every 8 hours 04/14/17 1309 04/19/17 1544   04/14/17 1315  vancomycin (VANCOCIN) IVPB 1000 mg/200 mL premix     1,000 mg 200 mL/hr over 60 Minutes Intravenous Every 1 hr x 2 04/14/17 1313 04/14/17 1836   04/14/17 1000  acyclovir (ZOVIRAX) tablet 400 mg     400 mg Oral  Every morning - 10a 04/13/17 1412     04/13/17 2000  ceFEPIme (MAXIPIME) 2 g in dextrose 5 % 50 mL IVPB  Status:  Discontinued     2 g 100 mL/hr over 30 Minutes Intravenous Every 24 hours 04/13/17 1716 04/14/17 1309   04/13/17 1800  doxycycline (VIBRA-TABS) tablet 100 mg  Status:  Discontinued     100 mg Oral Every 12 hours 04/13/17  1648 04/14/17 1309   04/13/17 1300  levofloxacin (LEVAQUIN) IVPB 500 mg     500 mg 100 mL/hr over 60 Minutes Intravenous  Once 04/13/17 1255 04/13/17 1507       Subjective:  Had severe pain after sitting on toilet yesterday to have BMs.  Did not eat dinner last night because he did not want to have to poop again.  His blood sugar was low this morning.  Pain overall is getting better.  Strength was better today than yesterday.    Objective: Vitals:   04/21/17 0830 04/21/17 0945 04/21/17 1156 04/21/17 1303  BP: (!) 101/52 (!) 118/45 (!) 153/101 (!) 135/95  Pulse: (!) 55 61  60  Resp: _0 Temp: 98 F (36.7 C) 98.9 F (37.2 C)  98.5 F (36.9 C)  TempSrc: Oral  Oral  Oral  SpO2: 100%   98%  Weight:      Height:        Intake/Output Summary (Last 24 hours) at 04/21/2017 1326 Last data filed at 04/21/2017 0915 Gross per 24 hour  Intake 2709.75 ml  Output 1125 ml  Net 1584.75 ml   Filed Weights   04/19/17 0543 04/20/17 0728 04/21/17 0500  Weight: (!) 179 kg (394 lb 10 oz) (!) 178 kg (392 lb 6.7 oz) (!) 177 kg (390 lb 3.4 oz)    Examination:  General exam:  Adult male, sitting in chair.  No acute distress.  HEENT:  NCAT, MMM Respiratory system: Clear to auscultation bilaterally Cardiovascular system: Regular rate and rhythm, normal S1/S2. No murmurs, rubs, gallops or clicks.  Warm extremities Gastrointestinal system: Normal active bowel sounds, soft, nondistended, nontender. MSK:  Normal tone and bulk, no lower extremity edema Neuro:  Grossly intact GU:  Unable to get a good exam because he is sitting in chair today.    Data Reviewed: I have personally reviewed following labs and imaging studies  CBC: Recent Labs  Lab 04/15/17 1600 04/16/17 1145 04/17/17 1045 04/18/17 0403 04/19/17 0442  WBC 9.9 8.4 9.1 8.3 8.9  NEUTROABS 6.7 5.2  --   --   --   HGB 7.4* 7.0* 7.5* 7.0* 7.2*  HCT 22.4* 21.9* 22.6* 21.5* 22.0*  MCV 91.1 91.6 90.8 91.5 91.7  PLT 132* 134*  140* 141* 382   Basic Metabolic Panel: Recent Labs  Lab 04/15/17 1600 04/16/17 1145 04/17/17 1045 04/18/17 0403 04/19/17 0442  NA 137 139 142 143 139  K 4.1 4.1 4.0 3.8 3.6  CL 106 110 110 111 109  CO2 _0 GLUCOSE 90 115* 74 122* 136*  BUN 35* 33* 28* 24* 21*  CREATININE 2.36* 2.18* 1.99* 2.08* 1.84*  CALCIUM 7.6* 7.7* 7.9* 7.6* 7.2*  MG  --  1.9 1.9  --   --    GFR: Estimated Creatinine Clearance: 74.8 mL/min (A) (by C-G formula based on SCr of 1.84 mg/dL (H)). Liver Function Tests: No results for input(s): AST, ALT, ALKPHOS, BILITOT, PROT, ALBUMIN in the last 168 hours. No results for input(s): LIPASE, AMYLASE in the last 168 hours. No results for input(s): AMMONIA in the last 168 hours. Coagulation Profile: No results for input(s): INR, PROTIME in the last 168 hours. Cardiac Enzymes: No results for input(s): CKTOTAL, CKMB, CKMBINDEX, TROPONINI in the last 168 hours. BNP (last 3 results) No results for input(s): PROBNP in the last 8760 hours. HbA1C: No results for input(s): HGBA1C in the last 72 hours. CBG: Recent Labs  Lab 04/20/17 1725 04/20/17 2139 04/21/17 0736 04/21/17 0835 04/21/17 1114  GLUCAP 94 105* 59* 79 124*   Lipid Profile: No results for input(s): CHOL, HDL, LDLCALC, TRIG, CHOLHDL, LDLDIRECT in the last 72 hours. Thyroid Function Tests: No results for input(s): TSH, T4TOTAL, FREET4, T3FREE, THYROIDAB in the last 72 hours. Anemia Panel: Recent Labs    04/20/17 0307  VITAMINB12 463  FOLATE 16.1  FERRITIN 79  RETICCTPCT 1.2   Urine analysis:    Component Value Date/Time   COLORURINE YELLOW 04/13/2017 1118   APPEARANCEUR HAZY (A) 04/13/2017 1118   LABSPEC 1.009 04/13/2017 1118   PHURINE 5.0 04/13/2017 1118   GLUCOSEU NEGATIVE 04/13/2017 1118   HGBUR SMALL (A) 04/13/2017 1118   BILIRUBINUR NEGATIVE 04/13/2017 1118   KETONESUR NEGATIVE 04/13/2017 1118   PROTEINUR 100 (A) 04/13/2017 1118   UROBILINOGEN 0.2  10/03/2012 1809    NITRITE NEGATIVE 04/13/2017 1118   LEUKOCYTESUR MODERATE (A) 04/13/2017 1118   Sepsis Labs: _0 (procalcitonin:4,lacticidven:4)  ) Recent Results (from the past 240 hour(s))  Culture, blood (routine x 2)     Status: None   Collection Time: 04/13/17  6:17 PM  Result Value Ref Range Status   Specimen Description BLOOD LEFT HAND  Final   Special Requests   Final    BOTTLES DRAWN AEROBIC AND ANAEROBIC Blood Culture adequate volume   Culture NO GROWTH 5 DAYS  Final   Report Status 04/18/2017 FINAL  Final  Culture, blood (routine x 2)     Status: None   Collection Time: 04/13/17  6:17 PM  Result Value Ref Range Status   Specimen Description LEFT ANTECUBITAL  Final   Special Requests   Final    BOTTLES DRAWN AEROBIC AND ANAEROBIC Blood Culture adequate volume   Culture NO GROWTH 5 DAYS  Final   Report Status 04/18/2017 FINAL  Final  MRSA PCR Screening     Status: None   Collection Time: 04/14/17  6:50 PM  Result Value Ref Range Status   MRSA by PCR NEGATIVE NEGATIVE Final    Comment:        The GeneXpert MRSA Assay (FDA approved for NASAL specimens only), is one component of a comprehensive MRSA colonization surveillance program. It is not intended to diagnose MRSA infection nor to guide or monitor treatment for MRSA infections.   Aerobic/Anaerobic Culture (surgical/deep wound)     Status: None   Collection Time: 04/14/17  8:15 PM  Result Value Ref Range Status   Specimen Description SCROTUM  Final   Special Requests NONE  Final   Gram Stain   Final    RARE WBC PRESENT, PREDOMINANTLY PMN ABUNDANT GRAM NEGATIVE RODS ABUNDANT GRAM POSITIVE COCCI Performed at [facility], [address], [place], [place] [address]    Culture   Final    MODERATE ENTEROCOCCUS AVIUM MIXED ANAEROBIC FLORA PRESENT.  CALL LAB IF FURTHER IID REQUIRED.    Report Status 04/19/2017 FINAL  Final   Organism ID, Bacteria ENTEROCOCCUS AVIUM  Final      Susceptibility   Enterococcus avium  - MIC*    AMPICILLIN <=2 SENSITIVE Sensitive     VANCOMYCIN <=0.5 SENSITIVE Sensitive     GENTAMICIN SYNERGY SENSITIVE Sensitive     * MODERATE ENTEROCOCCUS AVIUM      Radiology Studies: No results found.   Scheduled Meds: . acetaminophen  650 mg Oral TID  . acyclovir  400 mg Oral q morning - 10a  . allopurinol  300 mg Oral Daily  . atorvastatin  80 mg Oral QHS  . calcitRIOL  0.25 mcg Oral Once per day on Mon Wed Fri  . Chlorhexidine Gluconate Cloth  6 each Topical Daily  . colchicine  0.6 mg Oral Daily  . dexamethasone  10 mg Oral Q Fri-1800  . Eluxadoline  100 mg Oral BID WC  . gabapentin  300 mg Oral BID  . insulin aspart  0-9 Units Subcutaneous TID WC  . insulin aspart  3 Units Subcutaneous TID WC  . insulin detemir  15 Units Subcutaneous q morning - 10a  . mouth rinse  15 mL Mouth Rinse BID  . metoprolol succinate  25 mg Oral Daily  . multivitamin with minerals  1 tablet Oral Daily  . pantoprazole  40 mg Oral Daily  . povidone-iodine   Topical Daily  . rivaroxaban  20  mg Oral Q supper  . sulfamethoxazole-trimethoprim  1 tablet Oral Once per day on Mon Wed Fri  . torsemide  40 mg Oral Daily  . Vitamin D (Ergocalciferol)  50,000 Units Oral Q7 days   Continuous Infusions: . ampicillin-sulbactam (UNASYN) IV Stopped (04/21/17 1117)     LOS: 7 days    Time spent: 30 min    Janece Canterbury, MD [facility] Pager [phone]  If 7PM-7AM, please contact night-coverage [email or web address] Password [email or web address] 04/21/2017, 1:26 PM

## 2017-04-21 NOTE — Progress Notes (Signed)
Hypoglycemic Event  CBG: 59  Treatment: 15 GM carbohydrate snack -pt ate breakfast  Symptoms: Hungry  Follow-up CBG: Time:0830 CBG Result:79  Possible Reasons for Event: Unknown  Comments/MD notified: Dr. Sheran Fava notified via text at [phone]. No new orders received.    Derek Jack

## 2017-04-22 DIAGNOSIS — Z794 Long term (current) use of insulin: Secondary | ICD-10-CM

## 2017-04-22 DIAGNOSIS — E119 Type 2 diabetes mellitus without complications: Secondary | ICD-10-CM

## 2017-04-22 LAB — BASIC METABOLIC PANEL
Anion gap: 3 — ABNORMAL LOW (ref 5–15)
BUN: 13 mg/dL (ref 6–20)
CO2: 27 mmol/L (ref 22–32)
Calcium: 7.3 mg/dL — ABNORMAL LOW (ref 8.9–10.3)
Chloride: 112 mmol/L — ABNORMAL HIGH (ref 101–111)
Creatinine, Ser: 1.62 mg/dL — ABNORMAL HIGH (ref 0.61–1.24)
GFR calc Af Amer: 52 mL/min — ABNORMAL LOW (ref >60)
GFR calc non Af Amer: 45 mL/min — ABNORMAL LOW (ref >60)
Glucose, Bld: 89 mg/dL (ref 65–99)
Potassium: 3.4 mmol/L — ABNORMAL LOW (ref 3.5–5.1)
Sodium: 142 mmol/L (ref 135–145)

## 2017-04-22 LAB — CBC
HCT: 28.3 % — ABNORMAL LOW (ref 39.0–52.0)
Hemoglobin: 9.1 g/dL — ABNORMAL LOW (ref 13.0–17.0)
MCH: 29.9 pg (ref 26.0–34.0)
MCHC: 32.2 g/dL (ref 30.0–36.0)
MCV: 93.1 fL (ref 78.0–100.0)
Platelets: 229 10*3/uL (ref 150–400)
RBC: 3.04 MIL/uL — ABNORMAL LOW (ref 4.22–5.81)
RDW: 17.1 % — ABNORMAL HIGH (ref 11.5–15.5)
WBC: 9.6 10*3/uL (ref 4.0–10.5)

## 2017-04-22 LAB — BPAM RBC
Blood Product Expiration Date: 201812112359
Blood Product Expiration Date: 201812112359
Blood Product Expiration Date: 201812112359
ISSUE DATE / TIME: 201811192007
ISSUE DATE / TIME: 201811200136
ISSUE DATE / TIME: 201811200552
Unit Type and Rh: 5100
Unit Type and Rh: 5100
Unit Type and Rh: 5100

## 2017-04-22 LAB — TYPE AND SCREEN
ABO/RH(D): O POS
Antibody Screen: NEGATIVE
Unit division: 0
Unit division: 0
Unit division: 0

## 2017-04-22 LAB — GLUCOSE, CAPILLARY
Glucose-Capillary: 78 mg/dL (ref 65–99)
Glucose-Capillary: 107 mg/dL — ABNORMAL HIGH (ref 65–99)

## 2017-04-22 MED ORDER — INSULIN LISPRO 100 UNIT/ML ~~LOC~~ SOLN: 0.0000 [IU] | Freq: Two times a day (BID) | SUBCUTANEOUS | 0 refills | Status: DC

## 2017-04-22 MED ORDER — INSULIN DETEMIR 100 UNIT/ML ~~LOC~~ SOLN: 10.0000 [IU] | Freq: Every morning | SUBCUTANEOUS | 11 refills | Status: DC

## 2017-04-22 MED ORDER — GABAPENTIN 300 MG PO CAPS: 300.0000 mg | ORAL_CAPSULE | Freq: Two times a day (BID) | ORAL | 0 refills | Status: DC

## 2017-04-22 MED ORDER — OXYCODONE HCL 15 MG PO TABS: 15.0000 mg | ORAL_TABLET | ORAL | 0 refills | Status: AC | PRN

## 2017-04-22 MED ORDER — INSULIN DETEMIR 100 UNIT/ML ~~LOC~~ SOLN
10.0000 [IU] | Freq: Every morning | SUBCUTANEOUS | Status: DC
  Administered 2017-04-22: 10 [IU] via SUBCUTANEOUS
  Filled 2017-04-22: qty 0.1

## 2017-04-22 MED ORDER — AMOXICILLIN-POT CLAVULANATE 875-125 MG PO TABS: 1.0000 | ORAL_TABLET | Freq: Two times a day (BID) | ORAL | 0 refills | Status: AC

## 2017-04-22 MED ORDER — HEPARIN SOD (PORK) LOCK FLUSH 100 UNIT/ML IV SOLN
250.0000 [IU] | INTRAVENOUS | Status: AC | PRN
  Administered 2017-04-22: 500 [IU]

## 2017-04-22 MED ORDER — POTASSIUM CHLORIDE CRYS ER 20 MEQ PO TBCR
40.0000 meq | EXTENDED_RELEASE_TABLET | Freq: Once | ORAL | Status: AC
  Administered 2017-04-22: 40 meq via ORAL
  Filled 2017-04-22: qty 2

## 2017-04-22 NOTE — Clinical Social Work Placement (Addendum)
D/C Summary Faxed.  [facility] scheduled for 4:00pm transport.  Nurse given number for report.   CLINICAL SOCIAL WORK PLACEMENT  NOTE  Date:  04/22/2017  Patient Details  Name: [name] MRN: [id number] Date of Birth: [birth year]  Clinical Social Work is seeking post-discharge placement for this patient at the [place] level of care (*CSW will initial, date and re-position this form in  chart as items are completed):  Yes   Patient/family provided with [facility] Work Department's list of facilities offering this level of care within the geographic area requested by the patient (or if unable, by the patient's family).  Yes   Patient/family informed of their freedom to choose among providers that offer the needed level of care, that participate in Medicare, Medicaid or managed care program needed by the patient, have an available bed and are willing to accept the patient.  Yes   Patient/family informed of [facility]'s ownership interest in [facility] and [facility], as well as of the fact that they are under no obligation to receive care at these facilities.  PASRR submitted to EDS on       PASRR number received on       Existing PASRR number confirmed on 04/22/17     FL2 transmitted to all facilities in geographic area requested by pt/family on       FL2 transmitted to all facilities within larger geographic area on 04/20/17     Patient informed that his/her managed care company has contracts with or will negotiate with certain facilities, including the following:        Yes   Patient/family informed of bed offers received.  Patient chooses bed at [facility]     Physician recommends and patient chooses bed at [facility]    Patient to be transferred to [facility] on  .  Patient to be transferred to facility by [facility]      Patient family notified on   of transfer.  Name of family member notified:   Spouse at bedside.      PHYSICIAN Please prepare prescriptions, Please sign FL2, Please prepare priority discharge summary, including medications     Additional Comment:    _______________________________________________ Lia Hopping, LCSW 04/22/2017, 1:44 PM

## 2017-04-22 NOTE — Consult Note (Signed)
[facility] Nurse wound follow up Wound type:surgical, infectious Measurement:N/A Wound bed:N/A Drainage (amount, consistency, odor) serosanguinous, decreasing Periwound: macerated Dressing procedure/placement/frequency: Patient seen with Bedside RN today. Patient reported BM lat evening and has to have another at the first available opportunity.  Hygiene is difficult due to body habitus and he does not always ask for assistance to perform.  He is educated about the importance of keeping the surgical area clean and encouraged to ask Nursing to support him in the cleaning up of the perineal area following a bowel movement. Stool stains are noted to be on the antimicrobial textile (InterDry Ag+) in the inguinal skin folds as well as on the indwelling urinary catheter leg strap. New InterDry is provided and a spare ordered, a new leg strap is provided and a spare ordered.  Patient is going to have a bowel movement and the bedside RN will assist with cleaning him up afterward and will apply a new dressing at that time.  With wound care ordered only once daily, bedside RN is encouraged to check dressing more frequently in the event of soiling. I also took this opportunity to assess the two areas of partial and full thickness tissue loss from friction on the patient;s gluteal cleft.  The larger area has completely reepithelialized as of today and the smaller area is now 75% reepithelialized. [facility] nursing team will not follow, but will remain available to this patient, the nursing and medical teams.  Please re-consult if needed. Thanks, Maudie Flakes, MSN, RN, [place], Arther Abbott  Pager# [phone]

## 2017-04-22 NOTE — Progress Notes (Signed)
5 Days Post-Op Subjective: No acute events.  No complaints this morning  Objective: Vital signs in last 24 hours: Temp:  [98 F (36.7 C)-99.3 F (37.4 C)] 98.7 F (37.1 C) (11/21 0551) Pulse Rate:  [52-61] 52 (11/21 0551) Resp:  [16-18] 18 (11/21 0551) BP: (101-153)/(45-101) 120/66 (11/21 0551) SpO2:  [98 %-100 %] 100 % (11/21 0551) Weight:  [179.6 kg (396 lb)] 179.6 kg (396 lb) (11/21 0551)  Intake/Output from previous day: 11/20 0701 - 11/21 0700 In: 7493 [P.O.:360; Blood:384; IV Piggyback:400] Out: 3000 [Urine:3000]  Intake/Output this shift: No intake/output data recorded.  Physical Exam:  General: Alert and oriented CV: RRR, palpable distal pulses Lungs: CTAB, equal chest rise Abdomen: Soft, NTND, no rebound or guarding Incisions: The perineal dressing consists of a few soiled 4x4s and a malpositioned ABD pad.  There is some erythema extending along each of his lower ext and along his inguinal creases.  The perineal portion of his wound is soiled with stool.  Penrose drains in place with serous output.  No signs of scrotal cellulitis, necrosis or skin breakdown.  The scrotum and perineum are tender to palpation during exam  Lab Results: Recent Labs    04/21/17 1045 04/21/17 1519 04/22/17 0449  HGB 10.0* 10.0* 9.1*  HCT 31.1* 31.2* 28.3*   BMET Recent Labs    04/21/17 1045 04/22/17 0449  NA 142 142  K 3.5 3.4*  CL 110 112*  CO2 25 27  GLUCOSE 90 89  BUN 14 13  CREATININE 1.58* 1.62*  CALCIUM 7.6* 7.3*     Studies/Results: No results found.  Assessment/Plan:  Franklin Bohlman McLaurinis a 56 y.o.malewith epididymitis and free air within the scrotum, concerning for Fourniers gangrene. POD 7s/p scrotal debridement S/p dressing change on 04/15/17 Poorly controlled Type II DM Obesity Multiple Myeloma Chronic Anemia CKD PAF (on Xarelto)  -The scrotal and perineal wound/dressing needs to be changed every shift and cleaned once daily.  The wound is  currently soiled with stool.     LOS: 8 days   Ellison Hughs, MD 04/22/2017, 8:23 AM  [facility] Pager: [phone]

## 2017-04-22 NOTE — Discharge Instructions (Signed)
Change scrotal/perineal dressings once daily with cotton gauze and ABD pads.

## 2017-04-22 NOTE — Discharge Summary (Signed)
Physician Discharge Summary  [name] BDZ:[id number] DOB: 1960-06-18 DOA: 04/13/2017  PCP: [name], MD  Admit date: 04/13/2017 Discharge date: 04/22/2017  Admitted From: Home Disposition: Skilled nursing facility, [facility]  Recommendations for Outpatient Follow-up:  1. Follow up with Dr. Lovena Neighbours in 1 week to address Foley catheter and Penrose drains 2. Continue Augmentin through November 28, then stop 3. Dr. Sheldon Silvan in 2 weeks for repeat CBC and follow up of multiple [place]: Ongoing physical and occupational therapy Equipment/Devices: Continue Foley at discharge.  Wound care to partial thickness tissue loss at the upper left buttock, the full thickness tissue loss at the left buttock at the gluteal cleft and the right buttock at the gluteal cleft: cleanse with NS, pat gently dry.  Cover with silicone foam dressing (Allevyn). Change twice weekly and PRN rolling of dressing edges or soiling  Perineum and scrotum:  Apply kerlex gauze and ABD pads to the perineum and scrotum q shift.  The perineum needs to be cleaned q day and after bowel movements.  Discharge Condition:  Stable, improved CODE STATUS: Full code Diet recommendation: [place] diet  Brief/Interim Summary:  56 year old male with history of coronary artery disease, chronic systolic heart failure with ejection fraction 35-40%, paroxysmal atrial flutter on Xarelto, CKD stage III, chronic diarrhea, multiple myeloma, OSA on CPAP, and previous history of epididymitis who presented with scrotal pain.  He had a laceration to the posterior wall of the scrotum while sliding down from his bed a few days prior to admission.  He developed worsening pain without fever, chills.  Ultrasound in the emergency department and demonstrated a prominent epididymis on the left.  The patient was also found to have subcutaneous air on exam.  Urology was consulted and the patient was started on IV antibiotics.  He was  transferred from [facility] for Fournier's gangrene and he underwent his first debridement on 11/13, second debridement on 11/14, and final debridement with closure on 11/16.  Culture grew enterococcus avium which is pansensitive and mixed anaerobic flora.  His antibiotics were narrowed to Unasyn and he will be transitioned to augmentin to complete a 14-day course of antibiotics.  His MRSA PCR was negative.  He is very weak and needs frequent dressing changes and will go to SNF for ongoing therapy and care.      Discharge Diagnoses:  Principal Problem:   Fournier gangrene Active Problems:   Hypertension   Atrial flutter (HCC)   Anemia, normocytic normochromic   Epididymitis   Acute epididymitis  Severe Sepsis secondary to Fournier's Gangrene of the Scrotum, pain improving -11/13 Debridement scrotal abscess -11/14 Second debridement scrotal abscess - 11/16 Third debridement with closure and placement of penrose drains - Blood cultures NGTD - OR cultures grew enterococcus avium and mixed anaerobic flora -  Continue Augmentin through November 28, then stop -  Mobilize with PT/OT -  Continue oxycodone 15 mg q3h prn -  Continue gabapentin to BID (due to CrCl) on 11/18 -  Unable to start NSAID due to CKD -  Dr. Lovena Neighbours to address penrose drains and foley at visit in 1 week  Diabetes type 2 uncontrolled with complications, CBG have been low despite tapering down insulin -11/1 Hemoglobin A1c= 8.2 - decrease to Levemir to 10 units - Continue Sensitive SSI  Normocytic anemia with decrease in hemoglobin due to recent surgery (acute blood loss) and serious infection in the setting of multiple myeloma.  No obvious hemorrhage -11/15transfuse 1 unit.  PRBC - 11/16 transfused 1 unit PRBC -  Ferritin, TSH, folate, b12 normal, but retic very low 2/2 MM and CKD -  Spoke with Oncology > transfuse to hemoglobin of 10g/dl and they will see back in clinic on 12/7 at 11AM -  Transfused 3  units PRBC on 11/20  Acute on CKD stage III (baseline Cr 1.7 -1.9 ) -  Creatinine back to baseline -  d/c sodium bicarb as CO2 is stable  Chronic Systolic CHF (EF 02-58%) - Wt approximately stable -  Continued torsemide -  Continued metoprolol -  Holding ACEI due to recent AKI, can resume as outpatient  HTN -Toprol 25 mg daily  CAD -Lipitor 80 mg daily  Paroxysmal atrial fibrillation - continue xarelto  Multiple myeloma -Continue decadron, eluxadoline, bactrim, acyclovir -  Holding revlimid until antibiotics are complete per Oncology   Discharge Instructions  Discharge Instructions    Diet Carb Modified   Complete by:  As directed    Increase activity slowly   Complete by:  As directed        Medication List    STOP taking these medications   aspirin EC 81 MG tablet   lenalidomide 5 MG capsule Commonly known as:  [name]   lisinopril 5 MG tablet Commonly known as:  PRINIVIL,ZESTRIL   oxyCODONE-acetaminophen 5-325 MG tablet Commonly known as:  PERCOCET/ROXICET   sodium bicarbonate 650 MG tablet     TAKE these medications   acyclovir 400 MG tablet Commonly known as:  ZOVIRAX Take 1 tablet (400 mg total) by mouth every morning.   allopurinol 300 MG tablet Commonly known as:  ZYLOPRIM TAKE ONE TABLET BY MOUTH ONCE DAILY.   amoxicillin-clavulanate 875-125 MG tablet Commonly known as:  AUGMENTIN Take 1 tablet by mouth 2 (two) times daily for 7 days.   atorvastatin 80 MG tablet Commonly known as:  LIPITOR TAKE ONE TABLET BY MOUTH AT BEDTIME.   calcitRIOL 0.25 MCG capsule Commonly known as:  ROCALTROL Take 0.25 mcg by mouth 3 (three) times a week. Monday, Wednesday, Friday.   calcium carbonate 600 MG Tabs tablet Commonly known as:  OS-CAL Take 1,800 mg by mouth 2 (two) times daily.   colchicine 0.6 MG tablet Take 1 tablet (0.6 mg total) by mouth 2 (two) times daily.   dexamethasone 4 MG tablet Commonly known as:  DECADRON TAKE 5  TABLETS TWICE A DAY EVERY FRIDAY.   DRY EYES OP Place 1 drop into both eyes as needed (dry eyes).   gabapentin 300 MG capsule Commonly known as:  NEURONTIN Take 1 capsule (300 mg total) by mouth 2 (two) times daily. What changed:    how much to take  how to take this  when to take this  additional instructions   insulin detemir 100 UNIT/ML injection Commonly known as:  LEVEMIR Inject 0.1 mLs (10 Units total) into the skin every morning. What changed:  how much to take   insulin lispro 100 UNIT/ML injection Commonly known as:  HUMALOG Inject 0-0.1 mLs (0-10 Units total) into the skin 2 (two) times daily with a meal. Sliding Scale per patient  CBG 150-200: 1 units CBG 201-275:  3 units CBG 275-350: 5 units 351-425: 7 units 425-500: 9 units 500+: 10 units What changed:    how much to take  additional instructions   loratadine 10 MG tablet Commonly known as:  CLARITIN Take 10 mg by mouth every morning.   metoprolol succinate 25 MG 24 hr tablet Commonly known as:  TOPROL-XL TAKE (1) TABLET BY MOUTH ONCE DAILY.   multivitamins ther. w/minerals Tabs tablet Take 1 tablet by mouth daily.   nitroGLYCERIN 0.4 MG SL tablet Commonly known as:  NITROSTAT Place 1 tablet (0.4 mg total) under the tongue every 5 (five) minutes as needed for chest pain.   omeprazole 40 MG capsule Commonly known as:  PRILOSEC Take 1 capsule (40 mg total) by mouth daily.   oxyCODONE 15 MG immediate release tablet Commonly known as:  ROXICODONE Take 1 tablet (15 mg total) by mouth every 4 (four) hours as needed for up to 7 days for severe pain.   potassium chloride SA 20 MEQ tablet Commonly known as:  K-DUR,KLOR-CON Take 2 tablets (40 mEq total) by mouth 2 (two) times daily.   SLOW-MAG PO Take 1 tablet by mouth every morning.   sulfamethoxazole-trimethoprim 800-160 MG tablet Commonly known as:  BACTRIM DS,SEPTRA DS TAKE ONE TABLET BY MOUTH EVERY MONDAY, WEDNESDAY, AND FRIDAY.    torsemide 20 MG tablet Commonly known as:  DEMADEX TAKE 2 TABLETS BY MOUTH EACH MORNING AND 1 TABLET EACH EVENING.   VIBERZI 100 MG Tabs Generic drug:  Eluxadoline Take 100 mg 2 (two) times daily with a meal by mouth.   Vitamin D (Ergocalciferol) 50000 units Caps capsule Commonly known as:  DRISDOL TAKE 1 CAPSULE BY MOUTH EVERY THIRTY DAYS.   XARELTO 20 MG Tabs tablet Generic drug:  rivaroxaban TAKE 1 TABLET BY MOUTH DAILY.       Contact information for follow-up providers    [facility], PA-C Follow up.   Specialty:  Oncology Contact information: [place] [address] [phone]        Ceasar Mons, MD Follow up on 04/27/2017.   Specialty:  Urology Why:  Niel Hummer removal Contact information: [place] [address] [place] [address] [phone]            Contact information for after-discharge care    [facility] Follow up.   Service:  Skilled Nursing Contact information: [address] [place] [phone]                 No Known Allergies  Consultations: Urology Dr. Conception Oms Winter    US Scrotum  Result Date: 04/13/2017 CLINICAL DATA:  Pain and swelling for several days left greater than right EXAM: SCROTAL ULTRASOUND DOPPLER ULTRASOUND OF THE TESTICLES TECHNIQUE: Complete ultrasound examination of the testicles, epididymis, and other scrotal structures was performed. Color and spectral Doppler ultrasound were also utilized to evaluate blood flow to the testicles. COMPARISON:  None. FINDINGS: Right testicle Measurements: 3.5 x 2.3 x 2.5 cm. No mass or microlithiasis visualized. Left testicle Measurements: 3.5 x 2.4 x 2.4 cm. No mass or microlithiasis visualized. Right epididymis:  Normal in size and appearance. Left epididymis:  Prominent with slight increased vascularity. Hydrocele:  Small hydroceles noted bilaterally. Varicocele:  None  visualized. Pulsed Doppler interrogation of both testes demonstrates normal low resistance arterial and venous waveforms bilaterally. Note is made of scrotal thickening bilaterally. IMPRESSION: Prominent epididymis on the left with slight increased vascularity. This may represent a component of epididymitis. Normal-appearing testicles bilaterally. Small hydroceles bilaterally with scrotal skin thickening. Electronically Signed   By: Inez Catalina M.D.   On: 04/13/2017 12:37   Ct Abdomen Pelvis W Contrast  Addendum Date: 04/14/2017   ADDENDUM REPORT: 04/14/2017 15:53 ADDENDUM: These results were called by telephone at the time  of interpretation on 04/14/2017 at 3:35 pm to Dr. Dessa Phi , who verbally acknowledged these results. Electronically Signed   By: Julian Hy M.D.   On: 04/14/2017 15:53   Result Date: 04/14/2017 CLINICAL DATA:  Severe scrotal swelling, scrotal pain, sepsis, epididymitis, diabetic, evaluate for Fournier's gangrene EXAM: CT ABDOMEN AND PELVIS WITH CONTRAST TECHNIQUE: Multidetector CT imaging of the abdomen and pelvis was performed using the standard protocol following bolus administration of intravenous contrast. CONTRAST:  126m ISOVUE-300 IOPAMIDOL (ISOVUE-300) INJECTION 61% COMPARISON:  None. FINDINGS: Motion degraded images. Lower chest: Eventration of the left hemidiaphragm. Hepatobiliary: Geographic hepatic steatosis. Layering 4 mm gallstone (series 2/ image 29), without associated inflammatory changes. No intrahepatic or extrahepatic ductal dilatation. Pancreas: Within normal limits. Spleen: Within normal limits. Adrenals/Urinary Tract: Adrenal glands are within normal limits. Bilateral lower pole nonobstructing renal calculi measuring 3 mm (series 2/ images 4445). Mildly heterogeneous enhancement on the right. No hydronephrosis. Bladder is within normal limits. Stomach/Bowel: Stomach is within normal limits. No evidence of bowel obstruction. Normal appendix (series  2/image 71). Vascular/Lymphatic: No evidence of abdominal aortic aneurysm. Atherosclerotic calcifications of the abdominal aorta and branch vessels. No suspicious abdominopelvic lymphadenopathy. Small right inguinal nodes measuring up to 15 mm on the right (series 2/ image 98), likely reactive. Reproductive: Prostate is grossly unremarkable. Scrotal wall thickening/ edema with associated subcutaneous gas on the right (series 3/image 16). Other: No abdominopelvic ascites. Tiny fat containing periumbilical hernia. Subcutaneous injection sites along the anterior abdominal wall. Musculoskeletal: Degenerative changes of the visualized thoracolumbar spine. IMPRESSION: Scrotal wall thickening/edema with associated subcutaneous gas on the right. This appearance suggests infection with a gas-forming organism. Fournier's gangrene is the diagnosis of exclusion. Electronically Signed: By: SJulian HyM.D. On: 04/14/2017 15:33   UKoreaPelvic Doppler (torsion R/o Or Mass Arterial Flow)  Result Date: 04/13/2017 CLINICAL DATA:  Pain and swelling for several days left greater than right EXAM: SCROTAL ULTRASOUND DOPPLER ULTRASOUND OF THE TESTICLES TECHNIQUE: Complete ultrasound examination of the testicles, epididymis, and other scrotal structures was performed. Color and spectral Doppler ultrasound were also utilized to evaluate blood flow to the testicles. COMPARISON:  None. FINDINGS: Right testicle Measurements: 3.5 x 2.3 x 2.5 cm. No mass or microlithiasis visualized. Left testicle Measurements: 3.5 x 2.4 x 2.4 cm. No mass or microlithiasis visualized. Right epididymis:  Normal in size and appearance. Left epididymis:  Prominent with slight increased vascularity. Hydrocele:  Small hydroceles noted bilaterally. Varicocele:  None visualized. Pulsed Doppler interrogation of both testes demonstrates normal low resistance arterial and venous waveforms bilaterally. Note is made of scrotal thickening bilaterally. IMPRESSION:  Prominent epididymis on the left with slight increased vascularity. This may represent a component of epididymitis. Normal-appearing testicles bilaterally. Small hydroceles bilaterally with scrotal skin thickening. Electronically Signed   By: MInez CatalinaM.D.   On: 04/13/2017 12:37   Dg Foot Complete Right  Result Date: 04/08/2017 CLINICAL DATA:  Status post surgery. EXAM: RIGHT FOOT COMPLETE - 3+ VIEW COMPARISON:  04/02/2017 FINDINGS: Generalized osteopenia. Soft tissue wound at the tip of the great toe. Partial ostectomy of the distal aspect of the first distal phalanx. Mild osteoarthritis of the first MTP joint. No fracture or dislocation. Mild osteoarthritis of the talonavicular joint. IMPRESSION: 1. Soft tissue wound at the tip of the great toe. Partial ostectomy of the distal aspect of the first distal phalanx. Electronically Signed   By: HKathreen Devoid  On: 04/08/2017 09:17   Dg Foot Complete Right  Result Date: 04/02/2017  CLINICAL DATA:  Pain.  Recent ulcer first toe EXAM: RIGHT FOOT COMPLETE - 3+ VIEW COMPARISON:  MRI right forefoot January 16, 2017 FINDINGS: Frontal, oblique, and lateral views obtained. A bandage overlies the distal aspect of the first digit. There is no appreciable acute fracture or dislocation. There is an old fracture of second proximal phalanx with remodeling. Joint spaces appear normal. No erosive change or bony destruction. No soft tissue air seen. There is extensive vascular calcification throughout the ankle and foot. There is a spur arising from the inferior calcaneus. There is a benign appearing exostosis arising from the dorsal distal talus. There is mild soft tissue swelling over the dorsal forefoot. IMPRESSION: Soft tissue swelling over dorsal forefoot. Old healed fracture second proximal phalanx. No acute fracture or dislocation. No erosive change or bony destruction. No soft tissue air or radiopaque foreign body evident beyond bandage overlying first digit. Inferior  calcaneal spur noted. Widespread vascular calcification consistent with known diabetes mellitus. Electronically Signed   By: Lowella Grip III M.D.   On: 04/02/2017 15:58    Subjective:  Still has considerable scrotal pain when he has to sit on the toilet to have a bowel movement.  Overall, however, he is feeling much better.  He denies chest pains, shortness of breath, nausea, difficulty eating.  Discharge Exam: Vitals:   04/21/17 2155 04/22/17 0551  BP: 130/73 120/66  Pulse: (!) 58 (!) 52  Resp: 18 18  Temp: 99.3 F (37.4 C) 98.7 F (37.1 C)  SpO2: 99% 100%   Vitals:   04/21/17 1156 04/21/17 1303 04/21/17 2155 04/22/17 0551  BP: (!) 153/101 (!) 135/95 130/73 120/66  Pulse:  60 (!) 58 (!) 52  Resp:  _0 Temp:  98.5 F (36.9 C) 99.3 F (37.4 C) 98.7 F (37.1 C)  TempSrc:  Oral Oral Oral  SpO2:  98% 99% 100%  Weight:    (!) 179.6 kg (396 lb)  Height:        General: Obese male, pt is alert, awake, not in acute distress Cardiovascular: RRR, S1/S2 +, no rubs, no gallops Respiratory: CTA bilaterally, no wheezing, no rhonchi Abdominal: Soft, NT, ND, bowel sounds + Extremities: no edema, no cyanosis GU:  Not examined today as patient was recently examined by Urology and resting in bed.    The results of significant diagnostics from this hospitalization (including imaging, microbiology, ancillary and laboratory) are listed below for reference.     Microbiology: Recent Results (from the past 240 hour(s))  Culture, blood (routine x 2)     Status: None   Collection Time: 04/13/17  6:17 PM  Result Value Ref Range Status   Specimen Description BLOOD LEFT HAND  Final   Special Requests   Final    BOTTLES DRAWN AEROBIC AND ANAEROBIC Blood Culture adequate volume   Culture NO GROWTH 5 DAYS  Final   Report Status 04/18/2017 FINAL  Final  Culture, blood (routine x 2)     Status: None   Collection Time: 04/13/17  6:17 PM  Result Value Ref Range Status   Specimen  Description LEFT ANTECUBITAL  Final   Special Requests   Final    BOTTLES DRAWN AEROBIC AND ANAEROBIC Blood Culture adequate volume   Culture NO GROWTH 5 DAYS  Final   Report Status 04/18/2017 FINAL  Final  MRSA PCR Screening     Status: None   Collection Time: 04/14/17  6:50 PM  Result Value Ref Range Status   MRSA  by PCR NEGATIVE NEGATIVE Final    Comment:        The GeneXpert MRSA Assay (FDA approved for NASAL specimens only), is one component of a comprehensive MRSA colonization surveillance program. It is not intended to diagnose MRSA infection nor to guide or monitor treatment for MRSA infections.   Aerobic/Anaerobic Culture (surgical/deep wound)     Status: None   Collection Time: 04/14/17  8:15 PM  Result Value Ref Range Status   Specimen Description SCROTUM  Final   Special Requests NONE  Final   Gram Stain   Final    RARE WBC PRESENT, PREDOMINANTLY PMN ABUNDANT GRAM NEGATIVE RODS ABUNDANT GRAM POSITIVE COCCI Performed at [facility], [address], [place], [place] [address]    Culture   Final    MODERATE ENTEROCOCCUS AVIUM MIXED ANAEROBIC FLORA PRESENT.  CALL LAB IF FURTHER IID REQUIRED.    Report Status 04/19/2017 FINAL  Final   Organism ID, Bacteria ENTEROCOCCUS AVIUM  Final      Susceptibility   Enterococcus avium - MIC*    AMPICILLIN <=2 SENSITIVE Sensitive     VANCOMYCIN <=0.5 SENSITIVE Sensitive     GENTAMICIN SYNERGY SENSITIVE Sensitive     * MODERATE ENTEROCOCCUS AVIUM     Labs: BNP (last 3 results) No results for input(s): BNP in the last 8760 hours. Basic Metabolic Panel: Recent Labs  Lab 04/16/17 1145 04/17/17 1045 04/18/17 0403 04/19/17 0442 04/21/17 1045 04/22/17 0449  NA 139 142 143 139 142 142  K 4.1 4.0 3.8 3.6 3.5 3.4*  CL 110 110 111 109 110 112*  CO2 _0 GLUCOSE 115* 74 122* 136* 90 89  BUN 33* 28* 24* 21* 14 13  CREATININE 2.18* 1.99* 2.08* 1.84* 1.58* 1.62*  CALCIUM 7.7* 7.9* 7.6* 7.2* 7.6* 7.3*   MG 1.9 1.9  --   --   --   --    Liver Function Tests: No results for input(s): AST, ALT, ALKPHOS, BILITOT, PROT, ALBUMIN in the last 168 hours. No results for input(s): LIPASE, AMYLASE in the last 168 hours. No results for input(s): AMMONIA in the last 168 hours. CBC: Recent Labs  Lab 04/15/17 1600 04/16/17 1145  04/18/17 0403 04/19/17 0442 04/21/17 1045 04/21/17 1519 04/22/17 0449  WBC 9.9 8.4   < > 8.3 8.9 9.4 9.9 9.6  NEUTROABS 6.7 5.2  --   --   --   --   --   --   HGB 7.4* 7.0*   < > 7.0* 7.2* 10.0* 10.0* 9.1*  HCT 22.4* 21.9*   < > 21.5* 22.0* 31.1* 31.2* 28.3*  MCV 91.1 91.6   < > 91.5 91.7 92.6 92.3 93.1  PLT 132* 134*   < > 141* 159 239 225 229   < > = values in this interval not displayed.   Cardiac Enzymes: No results for input(s): CKTOTAL, CKMB, CKMBINDEX, TROPONINI in the last 168 hours. BNP: Invalid input(s): POCBNP CBG: Recent Labs  Lab 04/21/17 1114 04/21/17 1719 04/21/17 2152 04/22/17 0722 04/22/17 1146  GLUCAP 124* 91 107* 78 107*   D-Dimer No results for input(s): DDIMER in the last 72 hours. Hgb A1c No results for input(s): HGBA1C in the last 72 hours. Lipid Profile No results for input(s): CHOL, HDL, LDLCALC, TRIG, CHOLHDL, LDLDIRECT in the last 72 hours. Thyroid function studies No results for input(s): TSH, T4TOTAL, T3FREE, THYROIDAB in the last 72 hours.  Invalid input(s): FREET3 Anemia work up  Recent Labs    04/20/17 0307  VITAMINB12 463  FOLATE 16.1  FERRITIN 79  RETICCTPCT 1.2   Urinalysis    Component Value Date/Time   COLORURINE YELLOW 04/13/2017 1118   APPEARANCEUR HAZY (A) 04/13/2017 1118   LABSPEC 1.009 04/13/2017 1118   PHURINE 5.0 04/13/2017 1118   GLUCOSEU NEGATIVE 04/13/2017 1118   HGBUR SMALL (A) 04/13/2017 1118   BILIRUBINUR NEGATIVE 04/13/2017 1118   KETONESUR NEGATIVE 04/13/2017 1118   PROTEINUR 100 (A) 04/13/2017 1118   UROBILINOGEN 0.2 10/03/2012 1809   NITRITE NEGATIVE 04/13/2017 1118   LEUKOCYTESUR  MODERATE (A) 04/13/2017 1118   Sepsis Labs Invalid input(s): PROCALCITONIN,  WBC,  LACTICIDVEN   Time coordinating discharge: Over 30 minutes  SIGNED:   Janece Canterbury, MD  [facility] 04/22/2017, 2:19 PM Pager   If 7PM-7AM, please contact night-coverage [email or web address] Password [email or web address]

## 2017-04-22 NOTE — Progress Notes (Signed)
Pharmacy Antibiotic Note  [name] is a 56 y.o. male admitted on 04/13/2017 with Fournier's gangrene.  Pharmacy has been consulted for antibiotic narrowing from Zosyn to Unasyn dosing for wound infection with pan-sensitive enterococcus avium and mixed anaerobic flora.  04/22/2017 Scr 1.62, CrCl ~ 85 mls/min WBC WNL afebrile  Plan:  Continue Unasyn 3g IV q6h  Follow up renal fxn  Plan is to d/c to SNF on augmentin   Height: 6' 2.5" (189.2 cm) Weight: (!) 396 lb (179.6 kg) IBW/kg (Calculated) : 83.35  Temp (24hrs), Avg:98.8 F (37.1 C), Min:98.5 F (36.9 C), Max:99.3 F (37.4 C)  Recent Labs  Lab 04/17/17 1045 04/18/17 0403 04/19/17 0442 04/21/17 1045 04/21/17 1519 04/22/17 0449  WBC 9.1 8.3 8.9 9.4 9.9 9.6  CREATININE 1.99* 2.08* 1.84* 1.58*  --  1.62*    Estimated Creatinine Clearance: 85.7 mL/min (A) (by C-G formula based on SCr of 1.62 mg/dL (H)).    No Known Allergies  Antimicrobials this admission: Cefepime 11/12>> Levaquin 11/12>>11/12 Doxy 11/12 >>11/13 Vanc  11/13 >> 11/16 Zosyn  11/13 >> 11/18 Unasyn 11/18 >>  Dose adjustments this admission: 11/14: changed Vanc from 1750mg  IV q24 to 1250mg  IV q24 using AUC calculator - est AUC 428 using SCr 2.36 11/15: changed vanc to 1500 mg q24h for improving SCr to maintain AUC 400-500  Microbiology results: 11/12BCx: NGF 11/13 MRSA PCR: negative 11/12 HIV: nonreactive 11/13 tissue cx (scrotum): moderate Enterococcus avium (pan-sensitive), mixed anaerobic flora (call lab for further ID)   Thank you for allowing pharmacy to be a part of this patient's care.  Dolly Rias RPh 04/22/2017, 11:20 AM Pager [phone]

## 2017-04-22 NOTE — NC FL2 (Signed)
[place] LEVEL OF CARE SCREENING TOOL     IDENTIFICATION  Patient Name: [name] Birthdate: Sep 17, 1960 Sex: male Admission Date (Current Location): 04/13/2017  [facility] and [place] Number:  Herbalist and Address:  [facility],  [place] [address] [place]      Provider Number: [id number]  Attending Physician Name and Address:  Janece Canterbury, MD  Relative Name and Phone Number:       Current Level of Care: Hospital Recommended Level of Care: [place] Prior Approval Number:    Date Approved/Denied:   PASRR Number:   [id number]   Discharge Plan: SNF    Current Diagnoses: Patient Active Problem List   Diagnosis Date Noted  . Fournier gangrene 04/14/2017  . Epididymitis 04/13/2017  . Acute epididymitis 04/13/2017  . Pneumonia 06/10/2016  . CAP (community acquired pneumonia) 06/10/2016  . Cellulitis of left lower extremity   . Hypogammaglobulinemia ([place]) 03/09/2016  . Hypokalemia 03/09/2016  . Diaphragmatic hernia without obstruction 02/26/2016  . Chronic systolic heart failure ([place]) 12/19/2013  . Anemia, normocytic normochromic 10/09/2012  . Chronic pancreatitis ([place]) 10/07/2012  . Atrial flutter ([place]) 09/17/2012  . DDD (degenerative disc disease), cervical 03/18/2012  . Arteriosclerotic cardiovascular disease (ASCVD)   . Hypertension   . Hyperlipidemia   . Multiple myeloma ([place]) 07/01/2011  . Morbid obesity ([place]) 04/29/2010  . Insulin dependent diabetes mellitus ([place]) 11/15/2008  . Sleep apnea 11/15/2008    Orientation RESPIRATION BLADDER Height & Weight     Self, Time, Situation, Place  Normal Continent Weight: (!) 396 lb (179.6 kg) Height:  6' 2.5" (189.2 cm)  BEHAVIORAL SYMPTOMS/MOOD NEUROLOGICAL BOWEL NUTRITION STATUS      Continent Diet(Regular )  AMBULATORY STATUS COMMUNICATION OF NEEDS Skin   Extensive Assist Verbally Other (Comment)(Closed Incisions: perineum, right toe,  scrotum, abdomen, right foot; Open wound- scrotum; Diabetic Foot ulcer; open wound- right buttocks stage 1)                       Personal Care Assistance Level of Assistance  Bathing, Feeding, Dressing Bathing Assistance: Limited assistance Feeding assistance: Independent Dressing Assistance: Limited assistance     Functional Limitations Info  Sight, Hearing, Speech Sight Info: Adequate Hearing Info: Adequate Speech Info: Adequate    SPECIAL CARE FACTORS FREQUENCY  PT (By licensed PT), OT (By licensed OT)     PT Frequency: 5 OT Frequency: 5            Contractures      Additional Factors Info  Code Status, Allergies, Insulin Code Status Info: Full code  Allergies Info: NKA   Insulin Aspart(NovoLOG)Injection 0-9units  Dose: 0-9 Units Freq: 3 times daily with meals Route: [place]       Current Medications (04/22/2017):  This is the current hospital active medication list Current Facility-Administered Medications  Medication Dose Route Frequency Provider Last Rate Last Dose  . acetaminophen (TYLENOL) tablet 650 mg  650 mg Oral TID Janece Canterbury, MD   650 mg at 04/22/17 1031  . acyclovir (ZOVIRAX) tablet 400 mg  400 mg Oral q morning - 10a Buriev, Arie Sabina, MD   400 mg at 04/22/17 1032  . allopurinol (ZYLOPRIM) tablet 300 mg  300 mg Oral Daily Kinnie Feil, MD   300 mg at 04/22/17 1031  . Ampicillin-Sulbactam (UNASYN) 3 g in sodium chloride 0.9 % 100 mL IVPB  3 g Intravenous Q6H Shade, Haze Justin, RPH  Stopped at 04/22/17 1104  . atorvastatin (LIPITOR) tablet 80 mg  80 mg Oral QHS Kinnie Feil, MD   80 mg at 04/21/17 2126  . calcitRIOL (ROCALTROL) capsule 0.25 mcg  0.25 mcg Oral Once per day on Mon Wed Fri Kinnie Feil, MD   0.25 mcg at 04/22/17 1031  . Chlorhexidine Gluconate Cloth 2 % PADS 6 each  6 each Topical Daily Dessa Phi, DO   6 each at 04/22/17 1030  . colchicine tablet 0.6 mg  0.6 mg Oral Daily Kinnie Feil, MD   0.6 mg at  04/22/17 1031  . dexamethasone (DECADRON) tablet 10 mg  10 mg Oral Q Fri-1800 Buriev, Arie Sabina, MD      . Eluxadoline TABS 100 mg  100 mg Oral BID WC Janece Canterbury, MD   100 mg at 04/22/17 0735  . gabapentin (NEURONTIN) capsule 300 mg  300 mg Oral BID Janece Canterbury, MD   300 mg at 04/22/17 1031  . insulin aspart (novoLOG) injection 0-9 Units  0-9 Units Subcutaneous TID WC Kinnie Feil, MD   1 Units at 04/21/17 1210  . insulin detemir (LEVEMIR) injection 10 Units  10 Units Subcutaneous q morning - 10a Janece Canterbury, MD   10 Units at 04/22/17 1030  . MEDLINE mouth rinse  15 mL Mouth Rinse BID Allie Bossier, MD   15 mL at 04/22/17 1033  . menthol-cetylpyridinium (CEPACOL) lozenge 3 mg  1 lozenge Oral PRN Janece Canterbury, MD   3 mg at 04/19/17 0521  . metoprolol succinate (TOPROL-XL) 24 hr tablet 25 mg  25 mg Oral Daily Kinnie Feil, MD   25 mg at 04/22/17 1031  . multivitamin with minerals tablet 1 tablet  1 tablet Oral Daily Kinnie Feil, MD   1 tablet at 04/22/17 1031  . oxyCODONE (Oxy IR/ROXICODONE) immediate release tablet 15 mg  15 mg Oral Q3H PRN Janece Canterbury, MD   15 mg at 04/22/17 1249  . pantoprazole (PROTONIX) EC tablet 40 mg  40 mg Oral Daily Kinnie Feil, MD   40 mg at 04/22/17 1031  . povidone-iodine (BETADINE) 10 % ointment   Topical Daily Short, Mackenzie, MD      . rivaroxaban Alveda Reasons) tablet 20 mg  20 mg Oral Q supper Adrian Saran, RPH   20 mg at 04/21/17 1736  . sodium chloride flush (NS) 0.9 % injection 10-40 mL  10-40 mL Intracatheter PRN Dessa Phi, DO   10 mL at 04/20/17 1619  . sulfamethoxazole-trimethoprim (BACTRIM DS,SEPTRA DS) 800-160 MG per tablet 1 tablet  1 tablet Oral Once per day on Mon Wed Fri Kinnie Feil, MD   1 tablet at 04/22/17 1030  . torsemide (DEMADEX) tablet 40 mg  40 mg Oral Daily Kinnie Feil, MD   40 mg at 04/22/17 1032  . Vitamin D (Ergocalciferol) (DRISDOL) capsule 50,000 Units  50,000 Units Oral Q7  days Kinnie Feil, MD   50,000 Units at 04/20/17 1151   Facility-Administered Medications Ordered in Other Encounters  Medication Dose Route Frequency Provider Last Rate Last Dose  . sodium chloride 0.9 % injection 10 mL  10 mL Intravenous Once Farrel Gobble, MD         Discharge Medications: Please see discharge summary for a list of discharge medications.  Relevant Imaging Results:  Relevant Lab Results:   Additional Information SS#:[id number]  Lia Hopping, LCSW

## 2017-04-22 NOTE — Progress Notes (Signed)
Pt. placed on CPAP for h/s, humidifier filled, remains room air, tolerating well.

## 2017-04-22 NOTE — Progress Notes (Signed)
OT Cancellation Note  Patient Details Name: [name] MRN: [id number] DOB: 12-Nov-1960   Cancelled Treatment:    Reason Eval/Treat Not Completed: Other (comment). Pt is going to snf this afternoon. Will defer OT to next venue.  No OT note is needed prior to transfer per SW.  [name] 04/22/2017, 2:40 PM  Lesle Chris, OTR/L [phone] 04/22/2017

## 2017-05-04 ENCOUNTER — Telehealth (HOSPITAL_COMMUNITY): Payer: Self-pay | Admitting: Emergency Medicine

## 2017-05-04 NOTE — Telephone Encounter (Signed)
[facility] called to confirm if revlimid was still on hold for this pt.  RN would have to verify this information.  LMOM of pt to call RN back.  Pt has a return appt on 05/08/2017.  Discharge note from hospital states to hold revlimid until finished with antibiotics (which were suppose to end 11/28).  After verification of what the status of the revlimid is then the clinic will get in touch with [facility].

## 2017-05-05 ENCOUNTER — Telehealth (HOSPITAL_COMMUNITY): Payer: Self-pay | Admitting: Emergency Medicine

## 2017-05-05 NOTE — Telephone Encounter (Signed)
Called and spoke with the nurse at [facility] in [place].  Pt is not on [name].  Pt has finished his augmentin.  Per discharge instruction from the hospital pt may restart his revlimid when he has completed his antibiotics.  Dr Talbert Cage agreed he is able to restart his revlimid.  Angie notified that he can restart his revlimid and notify [facility].

## 2017-05-08 ENCOUNTER — Encounter (HOSPITAL_COMMUNITY): Payer: BLUE CROSS/BLUE SHIELD | Attending: Oncology

## 2017-05-08 DIAGNOSIS — D801 Nonfamilial hypogammaglobulinemia: Secondary | ICD-10-CM | POA: Insufficient documentation

## 2017-05-08 DIAGNOSIS — D649 Anemia, unspecified: Secondary | ICD-10-CM | POA: Insufficient documentation

## 2017-05-08 DIAGNOSIS — I4892 Unspecified atrial flutter: Secondary | ICD-10-CM | POA: Insufficient documentation

## 2017-05-08 DIAGNOSIS — G4730 Sleep apnea, unspecified: Secondary | ICD-10-CM | POA: Insufficient documentation

## 2017-05-08 DIAGNOSIS — I2510 Atherosclerotic heart disease of native coronary artery without angina pectoris: Secondary | ICD-10-CM | POA: Insufficient documentation

## 2017-05-08 DIAGNOSIS — I252 Old myocardial infarction: Secondary | ICD-10-CM | POA: Insufficient documentation

## 2017-05-08 DIAGNOSIS — M545 Low back pain: Secondary | ICD-10-CM | POA: Insufficient documentation

## 2017-05-08 DIAGNOSIS — E785 Hyperlipidemia, unspecified: Secondary | ICD-10-CM | POA: Insufficient documentation

## 2017-05-08 DIAGNOSIS — M109 Gout, unspecified: Secondary | ICD-10-CM | POA: Insufficient documentation

## 2017-05-08 DIAGNOSIS — K580 Irritable bowel syndrome with diarrhea: Secondary | ICD-10-CM | POA: Insufficient documentation

## 2017-05-08 DIAGNOSIS — I129 Hypertensive chronic kidney disease with stage 1 through stage 4 chronic kidney disease, or unspecified chronic kidney disease: Secondary | ICD-10-CM | POA: Insufficient documentation

## 2017-05-08 DIAGNOSIS — E1165 Type 2 diabetes mellitus with hyperglycemia: Secondary | ICD-10-CM | POA: Insufficient documentation

## 2017-05-08 DIAGNOSIS — K219 Gastro-esophageal reflux disease without esophagitis: Secondary | ICD-10-CM | POA: Insufficient documentation

## 2017-05-08 DIAGNOSIS — D696 Thrombocytopenia, unspecified: Secondary | ICD-10-CM | POA: Insufficient documentation

## 2017-05-08 DIAGNOSIS — N183 Chronic kidney disease, stage 3 (moderate): Secondary | ICD-10-CM | POA: Insufficient documentation

## 2017-05-08 DIAGNOSIS — C9001 Multiple myeloma in remission: Secondary | ICD-10-CM | POA: Insufficient documentation

## 2017-05-08 DIAGNOSIS — E1122 Type 2 diabetes mellitus with diabetic chronic kidney disease: Secondary | ICD-10-CM | POA: Insufficient documentation

## 2017-05-08 DIAGNOSIS — Z87891 Personal history of nicotine dependence: Secondary | ICD-10-CM | POA: Insufficient documentation

## 2017-05-09 ENCOUNTER — Other Ambulatory Visit (HOSPITAL_COMMUNITY): Payer: Self-pay | Admitting: Hematology & Oncology

## 2017-05-09 ENCOUNTER — Other Ambulatory Visit (HOSPITAL_COMMUNITY): Payer: Self-pay | Admitting: Oncology

## 2017-05-09 ENCOUNTER — Other Ambulatory Visit (HOSPITAL_COMMUNITY): Payer: Self-pay | Admitting: Adult Health

## 2017-05-09 ENCOUNTER — Other Ambulatory Visit: Payer: Self-pay | Admitting: Cardiovascular Disease

## 2017-05-15 ENCOUNTER — Ambulatory Visit (HOSPITAL_COMMUNITY): Payer: BLUE CROSS/BLUE SHIELD

## 2017-05-17 ENCOUNTER — Other Ambulatory Visit (INDEPENDENT_AMBULATORY_CARE_PROVIDER_SITE_OTHER): Payer: Self-pay | Admitting: Internal Medicine

## 2017-05-18 NOTE — Telephone Encounter (Signed)
Patient will need to have OV prior to further refills. 

## 2017-05-27 ENCOUNTER — Other Ambulatory Visit (HOSPITAL_COMMUNITY): Payer: Self-pay | Admitting: Adult Health

## 2017-05-27 ENCOUNTER — Encounter (HOSPITAL_COMMUNITY): Payer: Self-pay | Admitting: *Deleted

## 2017-05-27 DIAGNOSIS — C9000 Multiple myeloma not having achieved remission: Secondary | ICD-10-CM

## 2017-05-27 MED ORDER — LENALIDOMIDE 5 MG PO CAPS: ORAL_CAPSULE | ORAL | 0 refills | Status: DC

## 2017-05-27 NOTE — Progress Notes (Signed)
New prescription for [name] faxed to [facility] with updated provider information.

## 2017-05-28 ENCOUNTER — Encounter (INDEPENDENT_AMBULATORY_CARE_PROVIDER_SITE_OTHER): Payer: Self-pay | Admitting: Internal Medicine

## 2017-05-28 NOTE — Telephone Encounter (Signed)
An appointment was given for 06/16/17 at 11:00am with Deberah Castle, NP.  A letter was mailed to the patient.

## 2017-05-29 ENCOUNTER — Other Ambulatory Visit (HOSPITAL_COMMUNITY): Payer: BLUE CROSS/BLUE SHIELD

## 2017-05-29 ENCOUNTER — Ambulatory Visit (HOSPITAL_COMMUNITY): Payer: BLUE CROSS/BLUE SHIELD | Admitting: Adult Health

## 2017-06-12 ENCOUNTER — Other Ambulatory Visit (HOSPITAL_COMMUNITY): Payer: Self-pay | Admitting: Oncology

## 2017-06-12 ENCOUNTER — Other Ambulatory Visit (HOSPITAL_COMMUNITY): Payer: Self-pay | Admitting: Adult Health

## 2017-06-15 ENCOUNTER — Other Ambulatory Visit: Payer: Self-pay

## 2017-06-15 ENCOUNTER — Inpatient Hospital Stay (HOSPITAL_COMMUNITY): Payer: BLUE CROSS/BLUE SHIELD | Attending: Hematology and Oncology | Admitting: Hematology and Oncology

## 2017-06-15 ENCOUNTER — Inpatient Hospital Stay (HOSPITAL_COMMUNITY): Payer: BLUE CROSS/BLUE SHIELD

## 2017-06-15 ENCOUNTER — Encounter (HOSPITAL_COMMUNITY): Payer: Self-pay | Admitting: Hematology and Oncology

## 2017-06-15 ENCOUNTER — Other Ambulatory Visit (HOSPITAL_COMMUNITY): Payer: Self-pay | Admitting: Adult Health

## 2017-06-15 VITALS — BP 110/84 | HR 51 | Resp 18

## 2017-06-15 DIAGNOSIS — Z794 Long term (current) use of insulin: Secondary | ICD-10-CM | POA: Diagnosis not present

## 2017-06-15 DIAGNOSIS — E1122 Type 2 diabetes mellitus with diabetic chronic kidney disease: Secondary | ICD-10-CM | POA: Insufficient documentation

## 2017-06-15 DIAGNOSIS — I872 Venous insufficiency (chronic) (peripheral): Secondary | ICD-10-CM | POA: Insufficient documentation

## 2017-06-15 DIAGNOSIS — Z87891 Personal history of nicotine dependence: Secondary | ICD-10-CM | POA: Diagnosis not present

## 2017-06-15 DIAGNOSIS — N493 Fournier gangrene: Secondary | ICD-10-CM | POA: Diagnosis not present

## 2017-06-15 DIAGNOSIS — I2510 Atherosclerotic heart disease of native coronary artery without angina pectoris: Secondary | ICD-10-CM | POA: Diagnosis not present

## 2017-06-15 DIAGNOSIS — Z7901 Long term (current) use of anticoagulants: Secondary | ICD-10-CM | POA: Diagnosis not present

## 2017-06-15 DIAGNOSIS — E785 Hyperlipidemia, unspecified: Secondary | ICD-10-CM | POA: Insufficient documentation

## 2017-06-15 DIAGNOSIS — N183 Chronic kidney disease, stage 3 (moderate): Secondary | ICD-10-CM | POA: Diagnosis not present

## 2017-06-15 DIAGNOSIS — R197 Diarrhea, unspecified: Secondary | ICD-10-CM | POA: Insufficient documentation

## 2017-06-15 DIAGNOSIS — G4730 Sleep apnea, unspecified: Secondary | ICD-10-CM | POA: Insufficient documentation

## 2017-06-15 DIAGNOSIS — R5383 Other fatigue: Secondary | ICD-10-CM | POA: Insufficient documentation

## 2017-06-15 DIAGNOSIS — Z9889 Other specified postprocedural states: Secondary | ICD-10-CM | POA: Diagnosis not present

## 2017-06-15 DIAGNOSIS — Z79899 Other long term (current) drug therapy: Secondary | ICD-10-CM | POA: Diagnosis not present

## 2017-06-15 DIAGNOSIS — I129 Hypertensive chronic kidney disease with stage 1 through stage 4 chronic kidney disease, or unspecified chronic kidney disease: Secondary | ICD-10-CM | POA: Diagnosis not present

## 2017-06-15 DIAGNOSIS — R200 Anesthesia of skin: Secondary | ICD-10-CM | POA: Insufficient documentation

## 2017-06-15 DIAGNOSIS — Z809 Family history of malignant neoplasm, unspecified: Secondary | ICD-10-CM | POA: Insufficient documentation

## 2017-06-15 DIAGNOSIS — C9000 Multiple myeloma not having achieved remission: Secondary | ICD-10-CM | POA: Diagnosis present

## 2017-06-15 DIAGNOSIS — I252 Old myocardial infarction: Secondary | ICD-10-CM | POA: Insufficient documentation

## 2017-06-15 DIAGNOSIS — Z9989 Dependence on other enabling machines and devices: Secondary | ICD-10-CM | POA: Diagnosis not present

## 2017-06-15 DIAGNOSIS — Z993 Dependence on wheelchair: Secondary | ICD-10-CM | POA: Diagnosis not present

## 2017-06-15 DIAGNOSIS — K219 Gastro-esophageal reflux disease without esophagitis: Secondary | ICD-10-CM | POA: Diagnosis not present

## 2017-06-15 DIAGNOSIS — I4892 Unspecified atrial flutter: Secondary | ICD-10-CM | POA: Insufficient documentation

## 2017-06-15 DIAGNOSIS — C9001 Multiple myeloma in remission: Secondary | ICD-10-CM

## 2017-06-15 LAB — CBC WITH DIFFERENTIAL/PLATELET
Basophils Absolute: 0 10*3/uL (ref 0.0–0.1)
Basophils Relative: 0 %
Eosinophils Absolute: 0.4 10*3/uL (ref 0.0–0.7)
Eosinophils Relative: 5 %
HCT: 30.9 % — ABNORMAL LOW (ref 39.0–52.0)
Hemoglobin: 9.5 g/dL — ABNORMAL LOW (ref 13.0–17.0)
Lymphocytes Relative: 30 %
Lymphs Abs: 2.7 10*3/uL (ref 0.7–4.0)
MCH: 30.2 pg (ref 26.0–34.0)
MCHC: 30.7 g/dL (ref 30.0–36.0)
MCV: 98.1 fL (ref 78.0–100.0)
Monocytes Absolute: 2.1 10*3/uL — ABNORMAL HIGH (ref 0.1–1.0)
Monocytes Relative: 23 %
Neutro Abs: 3.8 10*3/uL (ref 1.7–7.7)
Neutrophils Relative %: 42 %
Platelets: 150 10*3/uL (ref 150–400)
RBC: 3.15 MIL/uL — ABNORMAL LOW (ref 4.22–5.81)
RDW: 16.6 % — ABNORMAL HIGH (ref 11.5–15.5)
WBC: 9 10*3/uL (ref 4.0–10.5)

## 2017-06-15 LAB — COMPREHENSIVE METABOLIC PANEL
ALT: 36 U/L (ref 17–63)
AST: 30 U/L (ref 15–41)
Albumin: 2.9 g/dL — ABNORMAL LOW (ref 3.5–5.0)
Alkaline Phosphatase: 86 U/L (ref 38–126)
Anion gap: 11 (ref 5–15)
BUN: 45 mg/dL — ABNORMAL HIGH (ref 6–20)
CO2: 23 mmol/L (ref 22–32)
Calcium: 8.2 mg/dL — ABNORMAL LOW (ref 8.9–10.3)
Chloride: 106 mmol/L (ref 101–111)
Creatinine, Ser: 1.54 mg/dL — ABNORMAL HIGH (ref 0.61–1.24)
GFR calc Af Amer: 56 mL/min — ABNORMAL LOW (ref >60)
GFR calc non Af Amer: 48 mL/min — ABNORMAL LOW (ref >60)
Glucose, Bld: 170 mg/dL — ABNORMAL HIGH (ref 65–99)
Potassium: 3.8 mmol/L (ref 3.5–5.1)
Sodium: 140 mmol/L (ref 135–145)
Total Bilirubin: 0.4 mg/dL (ref 0.3–1.2)
Total Protein: 5.6 g/dL — ABNORMAL LOW (ref 6.5–8.1)

## 2017-06-15 MED ORDER — HEPARIN SOD (PORK) LOCK FLUSH 100 UNIT/ML IV SOLN
INTRAVENOUS | Status: AC
  Filled 2017-06-15: qty 5

## 2017-06-15 MED ORDER — SODIUM CHLORIDE 0.9% FLUSH
10.0000 mL | INTRAVENOUS | Status: DC | PRN
  Administered 2017-06-15: 10 mL
  Filled 2017-06-15: qty 10

## 2017-06-15 MED ORDER — HEPARIN SOD (PORK) LOCK FLUSH 100 UNIT/ML IV SOLN
500.0000 [IU] | Freq: Once | INTRAVENOUS | Status: AC
  Administered 2017-06-15: 500 [IU] via INTRAVENOUS

## 2017-06-15 NOTE — Progress Notes (Signed)
Roda Shutters presented for Portacath access and flush. Portacath located right chest wall accessed with  H 20 needle. Good blood return present. Portacath flushed with 34ml NS and 500U/25ml Heparin and needle removed intact. Procedure without incident. Patient tolerated procedure well.  Labs drawn also

## 2017-06-16 ENCOUNTER — Ambulatory Visit (INDEPENDENT_AMBULATORY_CARE_PROVIDER_SITE_OTHER): Payer: BLUE CROSS/BLUE SHIELD | Admitting: Internal Medicine

## 2017-06-16 ENCOUNTER — Encounter (INDEPENDENT_AMBULATORY_CARE_PROVIDER_SITE_OTHER): Payer: Self-pay | Admitting: Internal Medicine

## 2017-06-16 LAB — KAPPA/LAMBDA LIGHT CHAINS
Kappa free light chain: 27.6 mg/L — ABNORMAL HIGH (ref 3.3–19.4)
Kappa, lambda light chain ratio: 0.96 (ref 0.26–1.65)
Lambda free light chains: 28.8 mg/L — ABNORMAL HIGH (ref 5.7–26.3)

## 2017-06-16 LAB — BETA 2 MICROGLOBULIN, SERUM: Beta-2 Microglobulin: 3.6 mg/L — ABNORMAL HIGH (ref 0.6–2.4)

## 2017-06-17 LAB — MULTIPLE MYELOMA PANEL, SERUM
Albumin SerPl Elph-Mcnc: 2.6 g/dL — ABNORMAL LOW (ref 2.9–4.4)
Albumin/Glob SerPl: 1.2 (ref 0.7–1.7)
Alpha 1: 0.2 g/dL (ref 0.0–0.4)
Alpha2 Glob SerPl Elph-Mcnc: 0.7 g/dL (ref 0.4–1.0)
B-Globulin SerPl Elph-Mcnc: 0.8 g/dL (ref 0.7–1.3)
Gamma Glob SerPl Elph-Mcnc: 0.6 g/dL (ref 0.4–1.8)
Globulin, Total: 2.3 g/dL (ref 2.2–3.9)
IgA: 107 mg/dL (ref 90–386)
IgG (Immunoglobin G), Serum: 665 mg/dL — ABNORMAL LOW (ref 700–1600)
IgM (Immunoglobulin M), Srm: 69 mg/dL (ref 20–172)
Total Protein ELP: 4.9 g/dL — ABNORMAL LOW (ref 6.0–8.5)

## 2017-06-23 ENCOUNTER — Other Ambulatory Visit (HOSPITAL_COMMUNITY): Payer: Self-pay | Admitting: Emergency Medicine

## 2017-06-23 DIAGNOSIS — C9000 Multiple myeloma not having achieved remission: Secondary | ICD-10-CM

## 2017-06-23 MED ORDER — LENALIDOMIDE 5 MG PO CAPS: ORAL_CAPSULE | ORAL | 0 refills | Status: DC

## 2017-06-23 NOTE — Progress Notes (Signed)
revlimid refilled

## 2017-06-25 ENCOUNTER — Ambulatory Visit: Payer: BLUE CROSS/BLUE SHIELD | Admitting: General Surgery

## 2017-06-25 ENCOUNTER — Encounter: Payer: Self-pay | Admitting: General Surgery

## 2017-06-25 VITALS — BP 121/65 | HR 75 | Temp 96.9°F | Ht 74.0 in | Wt 373.0 lb

## 2017-06-25 DIAGNOSIS — L723 Sebaceous cyst: Secondary | ICD-10-CM

## 2017-06-25 NOTE — Patient Instructions (Signed)
Epidermal Cyst An epidermal cyst is a small, painless lump under your skin. It may be called an epidermal inclusion cyst or an infundibular cyst. The cyst contains a grayish-white, bad-smelling substance (keratin). It is important not to pop epidermal cysts yourself. These cysts are usually harmless (benign), but they can get infected. Symptoms of infection may include:  Redness.  Inflammation.  Tenderness.  Warmth.  Fever.  A grayish-white, bad-smelling substance draining from the cyst.  Pus draining from the cyst.  Follow these instructions at home:  Take over-the-counter and prescription medicines only as told by your doctor.  If you were prescribed an antibiotic, use it as told by your doctor. Do not stop using the antibiotic even if you start to feel better.  Keep the area around your cyst clean and dry.  Wear loose, dry clothing.  Do not try to pop your cyst.  Avoid touching your cyst.  Check your cyst every day for signs of infection.  Keep all follow-up visits as told by your doctor. This is important. How is this prevented?  Wear clean, dry, clothing.  Avoid wearing tight clothing.  Keep your skin clean and dry. Shower or take baths every day.  Wash your body with a benzoyl peroxide wash when you shower or bathe. Contact a health care provider if:  Your cyst has symptoms of infection.  Your condition is not improving or is getting worse.  You have a cyst that looks different from other cysts you have had.  You have a fever. Get help right away if:  Redness spreads from the cyst into the surrounding area. This information is not intended to replace advice given to you by your health care provider. Make sure you discuss any questions you have with your health care provider. Document Released: 06/26/2004 Document Revised: 01/16/2016 Document Reviewed: 03/21/2015 Elsevier Interactive Patient Education  2018 Elsevier Inc.  

## 2017-06-25 NOTE — Progress Notes (Signed)
[name]; [id number]; June 24, 1960   HPI Patient is a morbidly obese black male with multiple medical problems who was referred to my care by Dr. [name] for evaluation and treatment of an abscess on his back.  It started several weeks ago but has been draining over the past week.  He has had purulent drainage with white clumps emanating from it.  He has a history of intermittent skin abscesses in the past.  He was started on an antibiotic by Dr. [name] and was referred to my care for assessment.  He currently has a pain level of 7 out of 10 when the abscess is touched.  Wife who has been taking care of the wound states it is improved. Past Medical History:  Diagnosis Date  . Anemia   . Arteriosclerotic cardiovascular disease (ASCVD)    MI-2000s; stent to the proximal LAD and diagonal in 2001; stress nuclear in 2008-impaired exercise capacity, left ventricular dilatation, moderately to severely depressed EF, apical, inferior and anteroseptal scar  . Arthritis   . Atrial flutter ([place])   . 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 Cellulitis of leg    both legs  . Chronic diarrhea   . Chronic kidney disease, stage 3, mod decreased GFR (HCC)    Creatinine of 1.84 in 06/2011 and 1.5 in 07/2011  . Diabetes mellitus    Insulin  . Dysrhythmia    AFlutter  . GERD (gastroesophageal reflux disease)   . Gout   . Hyperlipidemia   . Hypertension   . Injection site reaction   . Multiple myeloma 07/01/2011  . Myocardial infarction ([place]) 2000  . Obesity   . Pedal edema    Venous insufficiency  . Sleep apnea    uses cpap  . Ulcer     Past Surgical History:  Procedure Laterality Date  . ABSCESS DRAINAGE     Scrotal  . BIOPSY  01/02/2012   Procedure: BIOPSY;  Surgeon: Rogene Houston, MD;  [facility];  Service: Endoscopy;  Laterality: N/A;  . BONE MARROW BIOPSY  05/13/11  . CARDIAC CATHETERIZATION     cardiac stent  . CARDIOVERSION N/A 10/13/2012   Procedure: CARDIOVERSION;  Surgeon:  Yehuda Savannah, MD;  Location: [facility] ORS;  Service: Cardiovascular;  Laterality: N/A;  . CATARACT EXTRACTION W/PHACO Left 02/13/2014   Procedure: CATARACT EXTRACTION PHACO AND INTRAOCULAR LENS PLACEMENT ([place]);  Surgeon: Tonny Branch, MD;  Location: [facility];  Service: Ophthalmology;  Laterality: Left;  CDE:  7.67  . CATARACT EXTRACTION W/PHACO Right 03/02/2014   Procedure: CATARACT EXTRACTION PHACO AND INTRAOCULAR LENS PLACEMENT RIGHT EYE CDE=16.81;  Surgeon: Tonny Branch, MD;  Location: [facility];  Service: Ophthalmology;  Laterality: Right;  . COLONOSCOPY  11/28/2011   Procedure: COLONOSCOPY;  Surgeon: Rogene Houston, MD;  [facility];  Service: Endoscopy;  Laterality: N/A;  930  . ESOPHAGOGASTRODUODENOSCOPY  01/02/2012   Procedure: ESOPHAGOGASTRODUODENOSCOPY (EGD);  Surgeon: Rogene Houston, MD;  [facility];  Service: Endoscopy;  Laterality: N/A;  100  . ESOPHAGOGASTRODUODENOSCOPY N/A 09/20/2012   Procedure: ESOPHAGOGASTRODUODENOSCOPY (EGD);  Surgeon: Rogene Houston, MD;  [facility];  Service: Endoscopy;  Laterality: N/A;  . EUS N/A 10/07/2012   Procedure: UPPER ENDOSCOPIC ULTRASOUND (EUS) LINEAR;  Surgeon: Milus Banister, MD;  Location: [facility];  Service: Endoscopy;  Laterality: N/A;  . INCISION AND DRAINAGE ABSCESS N/A 04/14/2017   Procedure: INCISION AND DRAINAGE ABSCESS;  Surgeon: Ceasar Mons, MD;  Location: [facility];  Service: Urology;  Laterality: N/A;  . INCISION AND DRAINAGE ABSCESS ANAL    . IRRIGATION AND DEBRIDEMENT ABSCESS N/A 04/15/2017   Procedure: DEBRIDEMENT SCROTAL WOUND AND DRESSING CHANGE;  Surgeon: Ceasar Mons, MD;  Location: [facility];  Service: Urology;  Laterality: N/A;  . IRRIGATION AND DEBRIDEMENT ABSCESS N/A 04/17/2017   Procedure: IRRIGATION AND DEBRIDEMENT ABSCESS;  Surgeon: Ceasar Mons, MD;  Location: [facility];  Service: Urology;  Laterality: N/A;  RM 3  . LAPAROSCOPIC GASTRIC BANDING  2006   has been  removed  . OSTECTOMY Right 04/08/2017   Procedure: OSTECTOMY RIGHT GREAT TOE;  Surgeon: Caprice Beaver, DPM;  Location: [facility];  Service: Podiatry;  Laterality: Right;  . PORT-A-CATH REMOVAL Left 12/07/2012   Procedure: REMOVAL PORT-A-CATH;  Surgeon: Scherry Ran, MD;  Location: [facility];  Service: General;  Laterality: Left;  . PORTACATH PLACEMENT  07/07/2011   Procedure: INSERTION PORT-A-CATH;  Surgeon: Scherry Ran, MD;  Location: [facility];  Service: General;  Laterality: N/A;  . PORTACATH PLACEMENT N/A 12/07/2012   Procedure: INSERTION PORT-A-CATH;  Surgeon: Scherry Ran, MD;  Location: [facility];  Service: General;  Laterality: N/A;  Attempted portacath placement on left and right side  . WOUND DEBRIDEMENT Right 04/08/2017   Procedure: EXCISION ULCERATION RIGHT GREAT TOE;  Surgeon: Caprice Beaver, DPM;  Location: [facility];  Service: Podiatry;  Laterality: Right;  . WRIST SURGERY     Left; removal of bone fragment    Family History  Problem Relation Age of Onset  . Heart disease Mother   . Cancer Mother   . Diabetes Father   . Arthritis Unknown   . Anesthesia problems Neg Hx   . Hypotension Neg Hx   . Malignant hyperthermia Neg Hx   . Pseudochol deficiency Neg Hx     Current Outpatient Medications on File Prior to Visit  Medication Sig Dispense Refill  . acyclovir (ZOVIRAX) 400 MG tablet Take 1 tablet (400 mg total) by mouth every morning. 30 tablet 5  . allopurinol (ZYLOPRIM) 300 MG tablet TAKE ONE TABLET BY MOUTH ONCE DAILY. 30 tablet 0  . Artificial Tear Ointment (DRY EYES OP) Place 1 drop into both eyes as needed (dry eyes).    Marland Kitchen atorvastatin (LIPITOR) 80 MG tablet TAKE ONE TABLET BY MOUTH AT BEDTIME. 90 tablet 3  . calcitRIOL (ROCALTROL) 0.25 MCG capsule Take 0.25 mcg by mouth 3 (three) times a week. Monday, Wednesday, Friday.    . calcium carbonate (OS-CAL) 600 MG TABS tablet Take 1,800 mg by mouth 2 (two) times daily.     . colchicine 0.6 MG tablet Take 1 tablet  (0.6 mg total) by mouth 2 (two) times daily. 10 tablet 0  . dexamethasone (DECADRON) 4 MG tablet TAKE 5 TABLETS TWICE A DAY EVERY FRIDAY. 40 tablet 0  . dicyclomine (BENTYL) 20 MG tablet TAKE 1 TABLET BY MOUTH BEFORE MEALS 3 TIMES DAILY. 90 tablet 1  . Eluxadoline (VIBERZI) 100 MG TABS Take 100 mg 2 (two) times daily with a meal by mouth.     . gabapentin (NEURONTIN) 300 MG capsule Take 1 capsule (300 mg total) by mouth 2 (two) times daily. 60 capsule 0  . insulin detemir (LEVEMIR) 100 UNIT/ML injection Inject 0.1 mLs (10 Units total) into the skin every morning. 10 mL 11  . insulin lispro (HUMALOG) 100 UNIT/ML injection Inject 0-0.1 mLs (0-10 Units total) into the skin 2 (two) times daily with a meal. Sliding Scale per  patient  CBG 150-200: 1 units CBG 201-275:  3 units CBG 275-350: 5 units 351-425: 7 units 425-500: 9 units 500+: 10 units 10 mL 0  . lenalidomide ([name]) 5 MG capsule Take 1 capsule by mouth daily for 7 days, followed by 7 days off. 14 capsule 0  . loratadine (CLARITIN) 10 MG tablet Take 10 mg by mouth every morning.     . Magnesium Cl-Calcium Carbonate (SLOW-MAG PO) Take 1 tablet by mouth every morning.    . metoprolol succinate (TOPROL-XL) 25 MG 24 hr tablet TAKE (1) TABLET BY MOUTH ONCE DAILY. 30 tablet 0  . Multiple Vitamins-Minerals (MULTIVITAMINS THER. W/MINERALS) TABS Take 1 tablet by mouth daily.     . nitroGLYCERIN (NITROSTAT) 0.4 MG SL tablet DISSOLVE 1 TABLET UNDER TONGUE EVERY 5 MINUTES UP TO 15 MIN FOR CHESTPAIN. IF NO RELIEF CALL 911. 25 tablet 0  . omeprazole (PRILOSEC) 40 MG capsule Take 1 capsule (40 mg total) by mouth daily. 60 capsule 1  . potassium chloride SA (K-DUR,KLOR-CON) 20 MEQ tablet Take 2 tablets (40 mEq total) by mouth 2 (two) times daily. 120 tablet 2  . sulfamethoxazole-trimethoprim (BACTRIM DS,SEPTRA DS) 800-160 MG tablet TAKE ONE TABLET BY MOUTH EVERY MONDAY, WEDNESDAY, AND FRIDAY. 12 tablet 0  . torsemide (DEMADEX) 20 MG tablet TAKE 2  TABLETS BY MOUTH EACH MORNING AND 1 TABLET EACH EVENING. 240 tablet 0  . Vitamin D, Ergocalciferol, (DRISDOL) 50000 units CAPS capsule TAKE 1 CAPSULE BY MOUTH EVERY THIRTY DAYS. 3 capsule PRN  . XARELTO 20 MG TABS tablet TAKE 1 TABLET BY MOUTH DAILY. 30 tablet 11   Current Facility-Administered Medications on File Prior to Visit  Medication Dose Route Frequency Provider Last Rate Last Dose  . sodium chloride 0.9 % injection 10 mL  10 mL Intravenous Once Farrel Gobble, MD        No Known Allergies  Social History   Substance and Sexual Activity  Alcohol Use No  . Alcohol/week: 0.0 oz    Social History   Tobacco Use  Smoking Status Former Smoker  . Packs/day: 0.25  . Years: 1.00  . Pack years: 0.25  . Types: Cigarettes, Cigars  . Last attempt to quit: 05/17/2001  . Years since quitting: 16.1  Smokeless Tobacco Never Used    Review of Systems  Constitutional: Negative.   HENT: Negative.   Eyes: Positive for blurred vision.  Respiratory: Negative.   Cardiovascular: Negative.   Gastrointestinal: Positive for abdominal pain.  Genitourinary: Negative.   Musculoskeletal: Negative.   Skin:       boils  Neurological: Positive for tingling.  Endo/Heme/Allergies: Bruises/bleeds easily.  Psychiatric/Behavioral: Negative.     Objective   Vitals:   06/25/17 0918  BP: 121/65  Pulse: 75  Temp: (!) 96.9 F (36.1 C)    Physical Exam  Constitutional: He is oriented to person, place, and time and well-developed, well-nourished, and in no distress. No distress.  HENT:  Head: Normocephalic and atraumatic.  Cardiovascular: Normal rate, regular rhythm and normal heart sounds. Exam reveals no gallop and no friction rub.  No murmur heard. Pulmonary/Chest: Effort normal and breath sounds normal. No respiratory distress. He has no wheezes. He has no rales.  Neurological: He is alert and oriented to person, place, and time.  Skin:  3 cm fluctuant wound with multiple punctum is  present in the lower left back.  This is just above the buttocks.  I was able to express purulent fluid and sebum.  Mild erythema  noted.  Induration present.  Vitals reviewed.   Assessment  Infected sebaceous cyst, back, resolving Plan   No need for incision and drainage at this time as the wound is already draining and improving.  Would continue keeping wound clean and dry daily with soap and water.  Wife will try to express any fluid daily.  Follow-up here in 3 weeks as needed.  Finish antibiotic course.

## 2017-06-28 NOTE — Progress Notes (Signed)
[facility] Follow-up Visit:  Assessment: Multiple myeloma ([place]) 57 y.o. Male with diagnosis of kappa light chain multiple myeloma, previously in remissionon maintenance therapy with lenalidomide and dexamethasone.  Treatment was previously interrupted due to recurrent and progressively severe infections culminating in development of Fournier's gangrene in November 2018 which required several surgeries and extensive antibiotic courses for control.  Patient is still recovering from the event.  His performance status is yet to return to his previous baseline.    Plan: -At this point, patient is not ready to return to work systemic therapy due to high risk of recurrence of infections.  My recommendation is continued observation without therapy. -Return to clinic in 1 month with labs, clinic visit, discussion regarding utility of therapy resumption.   Voice recognition software was used and creation of this note. Despite my best effort at editing the text, some misspelling/errors may have occurred.  Orders Placed This Encounter  Procedures  . CBC with Differential    Standing Status:   Future    Standing Expiration Date:   06/15/2018  . Comprehensive metabolic panel    Standing Status:   Future    Standing Expiration Date:   06/15/2018    Cancer Staging No matching staging information was found for the patient.  All questions were answered.  . The patient knows to call the clinic with any problems, questions or concerns.  This note was electronically signed.    History of Presenting Illness [name] 57 y.o. presenting to the [facility] for continued monitoring and possible reinitiation of systemic therapy for kappa light chain multiple myeloma in remission.  Previously, treatment was placed on hold due to recurrent infections.  Patient was admitted to the hospital from 11/12-21/18 for diagnosis of Fournier's gangrene.  His disease required 3 debridement  procedures on 11/13, 11/14, and 11/16 with subsequent wound closure.  His wound is still healing.  Patient continues to complain of fatigue, but working with the physical therapy to improve his performance status.  At this time, he denies any skeletal complaints.  Continues to have numbness in bilateral feet.  No respiratory symptoms.  Has diarrhea, but no hematochezia or melena.  Oncological/hematological History:  No history exists.    Medical History: Past Medical History:  Diagnosis Date  . Anemia   . Arteriosclerotic cardiovascular disease (ASCVD)    MI-2000s; stent to the proximal LAD and diagonal in 2001; stress nuclear in 2008-impaired exercise capacity, left ventricular dilatation, moderately to severely depressed EF, apical, inferior and anteroseptal scar  . Arthritis   . Atrial flutter ([place])   . 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 Cellulitis of leg    both legs  . Chronic diarrhea   . Chronic kidney disease, stage 3, mod decreased GFR (HCC)    Creatinine of 1.84 in 06/2011 and 1.5 in 07/2011  . Diabetes mellitus    Insulin  . Dysrhythmia    AFlutter  . GERD (gastroesophageal reflux disease)   . Gout   . Hyperlipidemia   . Hypertension   . Injection site reaction   . Multiple myeloma 07/01/2011  . Myocardial infarction ([place]) 2000  . Obesity   . Pedal edema    Venous insufficiency  . Sleep apnea    uses cpap  . Ulcer     Surgical History: Past Surgical History:  Procedure Laterality Date  . ABSCESS DRAINAGE     Scrotal  . BIOPSY  01/02/2012   Procedure: BIOPSY;  Surgeon: Bernadene Person  Gloriann Loan, MD;  Location[facility];  Service: Endoscopy;  Laterality: N/A;  . BONE MARROW BIOPSY  05/13/11  . CARDIAC CATHETERIZATION     cardiac stent  . CARDIOVERSION N/A 10/13/2012   Procedure: CARDIOVERSION;  Surgeon: Yehuda Savannah, MD;  Location: [facility] ORS;  Service: Cardiovascular;  Laterality: N/A;  . CATARACT EXTRACTION W/PHACO Left 02/13/2014   Procedure: CATARACT EXTRACTION  PHACO AND INTRAOCULAR LENS PLACEMENT ([place]);  Surgeon: Tonny Branch, MD;  Location: [facility] ORS;  Service: Ophthalmology;  Laterality: Left;  CDE:  7.67  . CATARACT EXTRACTION W/PHACO Right 03/02/2014   Procedure: CATARACT EXTRACTION PHACO AND INTRAOCULAR LENS PLACEMENT RIGHT EYE CDE=16.81;  Surgeon: Tonny Branch, MD;  Location: [facility];  Service: Ophthalmology;  Laterality: Right;  . COLONOSCOPY  11/28/2011   Procedure: COLONOSCOPY;  Surgeon: Rogene Houston, MD;  Location: [facility];  Service: Endoscopy;  Laterality: N/A;  930  . ESOPHAGOGASTRODUODENOSCOPY  01/02/2012   Procedure: ESOPHAGOGASTRODUODENOSCOPY (EGD);  Surgeon: Rogene Houston, MD;  [facility];  Service: Endoscopy;  Laterality: N/A;  100  . ESOPHAGOGASTRODUODENOSCOPY N/A 09/20/2012   Procedure: ESOPHAGOGASTRODUODENOSCOPY (EGD);  Surgeon: Rogene Houston, MD;  [facility];  Service: Endoscopy;  Laterality: N/A;  . EUS N/A 10/07/2012   Procedure: UPPER ENDOSCOPIC ULTRASOUND (EUS) LINEAR;  Surgeon: Milus Banister, MD;  Location: [facility];  Service: Endoscopy;  Laterality: N/A;  . INCISION AND DRAINAGE ABSCESS N/A 04/14/2017   Procedure: INCISION AND DRAINAGE ABSCESS;  Surgeon: Ceasar Mons, MD;  Location: [facility];  Service: Urology;  Laterality: N/A;  . INCISION AND DRAINAGE ABSCESS ANAL    . IRRIGATION AND DEBRIDEMENT ABSCESS N/A 04/15/2017   Procedure: DEBRIDEMENT SCROTAL WOUND AND DRESSING CHANGE;  Surgeon: Ceasar Mons, MD;  Location: [facility];  Service: Urology;  Laterality: N/A;  . IRRIGATION AND DEBRIDEMENT ABSCESS N/A 04/17/2017   Procedure: IRRIGATION AND DEBRIDEMENT ABSCESS;  Surgeon: Ceasar Mons, MD;  Location: [facility];  Service: Urology;  Laterality: N/A;  RM 3  . LAPAROSCOPIC GASTRIC BANDING  2006   has been removed  . OSTECTOMY Right 04/08/2017   Procedure: OSTECTOMY RIGHT GREAT TOE;  Surgeon: Caprice Beaver, DPM;  Location: [facility];  Service: Podiatry;  Laterality:  Right;  . PORT-A-CATH REMOVAL Left 12/07/2012   Procedure: REMOVAL PORT-A-CATH;  Surgeon: Scherry Ran, MD;  Location: [facility];  Service: General;  Laterality: Left;  . PORTACATH PLACEMENT  07/07/2011   Procedure: INSERTION PORT-A-CATH;  Surgeon: Scherry Ran, MD;  Location: [facility];  Service: General;  Laterality: N/A;  . PORTACATH PLACEMENT N/A 12/07/2012   Procedure: INSERTION PORT-A-CATH;  Surgeon: Scherry Ran, MD;  Location: [facility];  Service: General;  Laterality: N/A;  Attempted portacath placement on left and right side  . WOUND DEBRIDEMENT Right 04/08/2017   Procedure: EXCISION ULCERATION RIGHT GREAT TOE;  Surgeon: Caprice Beaver, DPM;  Location: [facility];  Service: Podiatry;  Laterality: Right;  . WRIST SURGERY     Left; removal of bone fragment    Family History: Family History  Problem Relation Age of Onset  . Heart disease Mother   . Cancer Mother   . Diabetes Father   . Arthritis Unknown   . Anesthesia problems Neg Hx   . Hypotension Neg Hx   . Malignant hyperthermia Neg Hx   . Pseudochol deficiency Neg Hx     Social History: Social History   Socioeconomic History  . Marital status: Married  Spouse name: Not on file  . Number of children: Not on file  . Years of education: 83  . Highest education level: Not on file  Social Needs  . Financial resource strain: Not on file  . Food insecurity - worry: Not on file  . Food insecurity - inability: Not on file  . Transportation needs - medical: Not on file  . Transportation needs - non-medical: Not on file  Occupational History  . Occupation: Nurse, learning disability  . Occupation: Optometrist: [employer]  Tobacco Use  . Smoking status: Former Smoker    Packs/day: 0.25    Years: 1.00    Pack years: 0.25    Types: Cigarettes, Cigars    Last attempt to quit: 05/17/2001    Years since quitting: 16.1  . Smokeless tobacco: Never Used  Substance and Sexual Activity  . Alcohol  use: No    Alcohol/week: 0.0 oz  . Drug use: No  . Sexual activity: Yes    Birth control/protection: None  Other Topics Concern  . Not on file  Social History Narrative  . Not on file    Allergies: No Known Allergies  Medications:  Current Outpatient Medications  Medication Sig Dispense Refill  . acyclovir (ZOVIRAX) 400 MG tablet Take 1 tablet (400 mg total) by mouth every morning. 30 tablet 5  . allopurinol (ZYLOPRIM) 300 MG tablet TAKE ONE TABLET BY MOUTH ONCE DAILY. 30 tablet 0  . Artificial Tear Ointment (DRY EYES OP) Place 1 drop into both eyes as needed (dry eyes).    Marland Kitchen atorvastatin (LIPITOR) 80 MG tablet TAKE ONE TABLET BY MOUTH AT BEDTIME. 90 tablet 3  . calcitRIOL (ROCALTROL) 0.25 MCG capsule Take 0.25 mcg by mouth 3 (three) times a week. Monday, Wednesday, Friday.    . calcium carbonate (OS-CAL) 600 MG TABS tablet Take 1,800 mg by mouth 2 (two) times daily.     . colchicine 0.6 MG tablet Take 1 tablet (0.6 mg total) by mouth 2 (two) times daily. 10 tablet 0  . dexamethasone (DECADRON) 4 MG tablet TAKE 5 TABLETS TWICE A DAY EVERY FRIDAY. 40 tablet 0  . dicyclomine (BENTYL) 20 MG tablet TAKE 1 TABLET BY MOUTH BEFORE MEALS 3 TIMES DAILY. 90 tablet 1  . Eluxadoline (VIBERZI) 100 MG TABS Take 100 mg 2 (two) times daily with a meal by mouth.     . gabapentin (NEURONTIN) 300 MG capsule Take 1 capsule (300 mg total) by mouth 2 (two) times daily. 60 capsule 0  . insulin detemir (LEVEMIR) 100 UNIT/ML injection Inject 0.1 mLs (10 Units total) into the skin every morning. 10 mL 11  . insulin lispro (HUMALOG) 100 UNIT/ML injection Inject 0-0.1 mLs (0-10 Units total) into the skin 2 (two) times daily with a meal. Sliding Scale per patient  CBG 150-200: 1 units CBG 201-275:  3 units CBG 275-350: 5 units 351-425: 7 units 425-500: 9 units 500+: 10 units 10 mL 0  . lenalidomide ([name]) 5 MG capsule Take 1 capsule by mouth daily for 7 days, followed by 7 days off. 14 capsule 0  .  loratadine (CLARITIN) 10 MG tablet Take 10 mg by mouth every morning.     . Magnesium Cl-Calcium Carbonate (SLOW-MAG PO) Take 1 tablet by mouth every morning.    . metoprolol succinate (TOPROL-XL) 25 MG 24 hr tablet TAKE (1) TABLET BY MOUTH ONCE DAILY. 30 tablet 0  . Multiple Vitamins-Minerals (MULTIVITAMINS THER. W/MINERALS) TABS Take 1 tablet  by mouth daily.     . nitroGLYCERIN (NITROSTAT) 0.4 MG SL tablet DISSOLVE 1 TABLET UNDER TONGUE EVERY 5 MINUTES UP TO 15 MIN FOR CHESTPAIN. IF NO RELIEF CALL 911. 25 tablet 0  . omeprazole (PRILOSEC) 40 MG capsule Take 1 capsule (40 mg total) by mouth daily. 60 capsule 1  . potassium chloride SA (K-DUR,KLOR-CON) 20 MEQ tablet Take 2 tablets (40 mEq total) by mouth 2 (two) times daily. 120 tablet 2  . sulfamethoxazole-trimethoprim (BACTRIM DS,SEPTRA DS) 800-160 MG tablet TAKE ONE TABLET BY MOUTH EVERY MONDAY, WEDNESDAY, AND FRIDAY. 12 tablet 0  . torsemide (DEMADEX) 20 MG tablet TAKE 2 TABLETS BY MOUTH EACH MORNING AND 1 TABLET EACH EVENING. 240 tablet 0  . Vitamin D, Ergocalciferol, (DRISDOL) 50000 units CAPS capsule TAKE 1 CAPSULE BY MOUTH EVERY THIRTY DAYS. 3 capsule PRN  . XARELTO 20 MG TABS tablet TAKE 1 TABLET BY MOUTH DAILY. 30 tablet 11   No current facility-administered medications for this visit.    Facility-Administered Medications Ordered in Other Visits  Medication Dose Route Frequency Provider Last Rate Last Dose  . sodium chloride 0.9 % injection 10 mL  10 mL Intravenous Once Farrel Gobble, MD        Review of Systems: Review of Systems - Oncology   PHYSICAL EXAMINATION Blood pressure 110/84, pulse (!) 51, resp. rate 18, SpO2 100 %.  ECOG PERFORMANCE STATUS: 3 - Symptomatic, >50% confined to bed  Physical Exam  Constitutional: He is oriented to person, place, and time.  Wheelchair-bound male with supra-morbid obesity.  No acute distress.   alert, awake, oriented x3.  HENT:  Head: Normocephalic and atraumatic.  Mouth/Throat:  Oropharynx is clear and moist. No oropharyngeal exudate.  Eyes: Conjunctivae and EOM are normal. Pupils are equal, round, and reactive to light. No scleral icterus.  Neck: No thyromegaly present.  Cardiovascular: Normal rate, regular rhythm and normal heart sounds.  No murmur heard. Pulmonary/Chest: Effort normal and breath sounds normal. No respiratory distress. He has no wheezes.  Abdominal: Soft. Bowel sounds are normal.  Extensive wounds making meaningful examination of the abdomen impossible.  Genitourinary:  Genitourinary Comments: Healing postoperative wounds in the perineal/inguinal area.  Musculoskeletal: He exhibits edema.  Lymphadenopathy:    He has no cervical adenopathy.  Neurological: He is alert and oriented to person, place, and time. He has normal reflexes. No cranial nerve deficit.  Skin: Skin is warm and dry. No rash noted. He is not diaphoretic. No erythema.     LABORATORY DATA: I have personally reviewed the data as listed: Infusion on 06/15/2017  Component Date Value Ref Range Status  . Beta-2 Microglobulin 06/15/2017 3.6* 0.6 - 2.4 mg/L Final   Comment: (NOTE) Siemens Immulite 2000 Immunochemiluminometric assay (ICMA) Values obtained with different assay methods or kits cannot be used interchangeably. Results cannot be interpreted as absolute evidence of the presence or absence of malignant disease. Performed At: [facility] [place], [place] [id number] Rush Farmer MD [id number]   . Kappa free light chain 06/15/2017 27.6* 3.3 - 19.4 mg/L Final  . Lamda free light chains 06/15/2017 28.8* 5.7 - 26.3 mg/L Final  . Kappa, lamda light chain ratio 06/15/2017 0.96  0.26 - 1.65 Final   Comment: (NOTE) Performed At: [facility] [place], [place] [id number] Rush Farmer MD [id number]   . IgG (Immunoglobin G), Serum 06/15/2017 665* 700 - 1,600 mg/dL Final  . IgA 06/15/2017 107  90 - 386 mg/dL Final  . IgM  (  Immunoglobulin M), Srm 06/15/2017 69  20 - 172 mg/dL Final  . Total Protein ELP 06/15/2017 4.9* 6.0 - 8.5 g/dL Corrected  . Albumin SerPl Elph-Mcnc 06/15/2017 2.6* 2.9 - 4.4 g/dL Corrected  . Alpha 1 06/15/2017 0.2  0.0 - 0.4 g/dL Corrected  . Alpha2 Glob SerPl Elph-Mcnc 06/15/2017 0.7  0.4 - 1.0 g/dL Corrected  . B-Globulin SerPl Elph-Mcnc 06/15/2017 0.8  0.7 - 1.3 g/dL Corrected  . Gamma Glob SerPl Elph-Mcnc 06/15/2017 0.6  0.4 - 1.8 g/dL Corrected  . M Protein SerPl Elph-Mcnc 06/15/2017 Not Observed  Not Observed g/dL Corrected  . Globulin, Total 06/15/2017 2.3  2.2 - 3.9 g/dL Corrected  . Albumin/Glob SerPl 06/15/2017 1.2  0.7 - 1.7 Corrected  . IFE 1 06/15/2017 Comment   Corrected   An apparent normal immunofixation pattern.  . Please Note 06/15/2017 Comment   Corrected   Comment: (NOTE) Protein electrophoresis scan will follow via computer, mail, or courier delivery. Performed At: [facility] [place], [place] [id number] Rush Farmer MD [id number]   . Sodium 06/15/2017 140  135 - 145 mmol/L Final  . Potassium 06/15/2017 3.8  3.5 - 5.1 mmol/L Final  . Chloride 06/15/2017 106  101 - 111 mmol/L Final  . CO2 06/15/2017 23  22 - 32 mmol/L Final  . Glucose, Bld 06/15/2017 170* 65 - 99 mg/dL Final  . BUN 06/15/2017 45* 6 - 20 mg/dL Final  . Creatinine, Ser 06/15/2017 1.54* 0.61 - 1.24 mg/dL Final  . Calcium 06/15/2017 8.2* 8.9 - 10.3 mg/dL Final  . Total Protein 06/15/2017 5.6* 6.5 - 8.1 g/dL Final  . Albumin 06/15/2017 2.9* 3.5 - 5.0 g/dL Final  . AST 06/15/2017 30  15 - 41 U/L Final  . ALT 06/15/2017 36  17 - 63 U/L Final  . Alkaline Phosphatase 06/15/2017 86  38 - 126 U/L Final  . Total Bilirubin 06/15/2017 0.4  0.3 - 1.2 mg/dL Final  . GFR calc non Af Amer 06/15/2017 48* >60 mL/min Final  . GFR calc Af Amer 06/15/2017 56* >60 mL/min Final   Comment: (NOTE) The eGFR has been calculated using the CKD EPI equation. This calculation has not been  validated in all clinical situations. eGFR's persistently <60 mL/min signify possible Chronic Kidney Disease.   . Anion gap 06/15/2017 11  5 - 15 Final  . WBC 06/15/2017 9.0  4.0 - 10.5 K/uL Final  . RBC 06/15/2017 3.15* 4.22 - 5.81 MIL/uL Final  . Hemoglobin 06/15/2017 9.5* 13.0 - 17.0 g/dL Final  . HCT 06/15/2017 30.9* 39.0 - 52.0 % Final  . MCV 06/15/2017 98.1  78.0 - 100.0 fL Final  . MCH 06/15/2017 30.2  26.0 - 34.0 pg Final  . MCHC 06/15/2017 30.7  30.0 - 36.0 g/dL Final  . RDW 06/15/2017 16.6* 11.5 - 15.5 % Final  . Platelets 06/15/2017 150  150 - 400 K/uL Final  . Neutrophils Relative % 06/15/2017 42  % Final  . Neutro Abs 06/15/2017 3.8  1.7 - 7.7 K/uL Final  . Lymphocytes Relative 06/15/2017 30  % Final  . Lymphs Abs 06/15/2017 2.7  0.7 - 4.0 K/uL Final  . Monocytes Relative 06/15/2017 23  % Final  . Monocytes Absolute 06/15/2017 2.1* 0.1 - 1.0 K/uL Final  . Eosinophils Relative 06/15/2017 5  % Final  . Eosinophils Absolute 06/15/2017 0.4  0.0 - 0.7 K/uL Final  . Basophils Relative 06/15/2017 0  % Final  . Basophils Absolute 06/15/2017 0.0  0.0 -  0.1 K/uL Final       Ardath Sax, MD

## 2017-06-28 NOTE — Assessment & Plan Note (Signed)
57 y.o. Male with diagnosis of kappa light chain multiple myeloma, previously in remissionon maintenance therapy with lenalidomide and dexamethasone.  Treatment was previously interrupted due to recurrent and progressively severe infections culminating in development of Fournier's gangrene in November 2018 which required several surgeries and extensive antibiotic courses for control.  Patient is still recovering from the event.  His performance status is yet to return to his previous baseline.    Plan: -At this point, patient is not ready to return to work systemic therapy due to high risk of recurrence of infections.  My recommendation is continued observation without therapy. -Return to clinic in 1 month with labs, clinic visit, discussion regarding utility of therapy resumption.

## 2017-07-04 ENCOUNTER — Other Ambulatory Visit: Payer: Self-pay | Admitting: Cardiovascular Disease

## 2017-07-04 ENCOUNTER — Other Ambulatory Visit (HOSPITAL_COMMUNITY): Payer: Self-pay | Admitting: Adult Health

## 2017-07-07 ENCOUNTER — Ambulatory Visit: Payer: BLUE CROSS/BLUE SHIELD | Admitting: General Surgery

## 2017-07-09 ENCOUNTER — Ambulatory Visit: Payer: BLUE CROSS/BLUE SHIELD | Admitting: General Surgery

## 2017-07-09 ENCOUNTER — Other Ambulatory Visit (HOSPITAL_COMMUNITY): Payer: Self-pay | Admitting: Oncology

## 2017-07-14 ENCOUNTER — Ambulatory Visit: Payer: BLUE CROSS/BLUE SHIELD | Admitting: General Surgery

## 2017-07-14 ENCOUNTER — Other Ambulatory Visit (HOSPITAL_COMMUNITY): Payer: Self-pay | Admitting: Adult Health

## 2017-07-14 ENCOUNTER — Encounter: Payer: Self-pay | Admitting: General Surgery

## 2017-07-14 VITALS — BP 119/76 | HR 78 | Temp 97.3°F | Ht 74.0 in | Wt 368.0 lb

## 2017-07-14 DIAGNOSIS — L723 Sebaceous cyst: Secondary | ICD-10-CM

## 2017-07-14 NOTE — Progress Notes (Signed)
Subjective:     [name]  Here for follow-up of sebaceous cyst on his back.  Wife has been pushing on that area and getting white sebum from it.  It is still tender to touch when she does that. Objective:    BP 119/76   Pulse 78   Temp (!) 97.3 F (36.3 C)   Ht 6\' 2"  (1.88 m)   Wt (!) 368 lb (166.9 kg)   BMI 47.25 kg/m   General:  alert, cooperative and no distress  Back: 2-3 cm area of involvement with the sebaceous cyst with multiple punctum present.  I was able to express more sebum from it.  No purulent drainage was noted.  It was tender when I did this.  Minimal erythema is noted.     Assessment:    Sebaceous cyst, back, resolving    Plan:  Continue expressing sebum as able.  Follow-up here in 2 weeks.  No need for antibiotics.

## 2017-07-16 ENCOUNTER — Inpatient Hospital Stay (HOSPITAL_COMMUNITY): Payer: BLUE CROSS/BLUE SHIELD | Attending: Internal Medicine

## 2017-07-16 ENCOUNTER — Inpatient Hospital Stay (HOSPITAL_COMMUNITY): Payer: BLUE CROSS/BLUE SHIELD | Admitting: Internal Medicine

## 2017-07-16 ENCOUNTER — Ambulatory Visit (HOSPITAL_COMMUNITY): Payer: BLUE CROSS/BLUE SHIELD | Admitting: Internal Medicine

## 2017-07-16 ENCOUNTER — Encounter (HOSPITAL_COMMUNITY): Payer: Self-pay | Admitting: Internal Medicine

## 2017-07-16 ENCOUNTER — Other Ambulatory Visit (HOSPITAL_COMMUNITY): Payer: BLUE CROSS/BLUE SHIELD

## 2017-07-16 ENCOUNTER — Other Ambulatory Visit: Payer: Self-pay

## 2017-07-16 VITALS — BP 83/51 | HR 60 | Resp 16

## 2017-07-16 DIAGNOSIS — Z7901 Long term (current) use of anticoagulants: Secondary | ICD-10-CM | POA: Diagnosis not present

## 2017-07-16 DIAGNOSIS — R197 Diarrhea, unspecified: Secondary | ICD-10-CM | POA: Insufficient documentation

## 2017-07-16 DIAGNOSIS — D649 Anemia, unspecified: Secondary | ICD-10-CM | POA: Insufficient documentation

## 2017-07-16 DIAGNOSIS — C9000 Multiple myeloma not having achieved remission: Secondary | ICD-10-CM | POA: Insufficient documentation

## 2017-07-16 DIAGNOSIS — E785 Hyperlipidemia, unspecified: Secondary | ICD-10-CM | POA: Insufficient documentation

## 2017-07-16 DIAGNOSIS — I10 Essential (primary) hypertension: Secondary | ICD-10-CM

## 2017-07-16 DIAGNOSIS — I5022 Chronic systolic (congestive) heart failure: Secondary | ICD-10-CM | POA: Insufficient documentation

## 2017-07-16 DIAGNOSIS — N493 Fournier gangrene: Secondary | ICD-10-CM | POA: Insufficient documentation

## 2017-07-16 DIAGNOSIS — Z79899 Other long term (current) drug therapy: Secondary | ICD-10-CM | POA: Insufficient documentation

## 2017-07-16 DIAGNOSIS — I252 Old myocardial infarction: Secondary | ICD-10-CM | POA: Insufficient documentation

## 2017-07-16 DIAGNOSIS — Z87891 Personal history of nicotine dependence: Secondary | ICD-10-CM | POA: Diagnosis not present

## 2017-07-16 DIAGNOSIS — C9001 Multiple myeloma in remission: Secondary | ICD-10-CM

## 2017-07-16 LAB — COMPREHENSIVE METABOLIC PANEL
ALT: 25 U/L (ref 17–63)
AST: 32 U/L (ref 15–41)
Albumin: 3 g/dL — ABNORMAL LOW (ref 3.5–5.0)
Alkaline Phosphatase: 80 U/L (ref 38–126)
Anion gap: 10 (ref 5–15)
BUN: 27 mg/dL — ABNORMAL HIGH (ref 6–20)
CO2: 23 mmol/L (ref 22–32)
Calcium: 8.8 mg/dL — ABNORMAL LOW (ref 8.9–10.3)
Chloride: 108 mmol/L (ref 101–111)
Creatinine, Ser: 2.21 mg/dL — ABNORMAL HIGH (ref 0.61–1.24)
GFR calc Af Amer: 36 mL/min — ABNORMAL LOW (ref >60)
GFR calc non Af Amer: 31 mL/min — ABNORMAL LOW (ref >60)
Glucose, Bld: 131 mg/dL — ABNORMAL HIGH (ref 65–99)
Potassium: 3.9 mmol/L (ref 3.5–5.1)
Sodium: 141 mmol/L (ref 135–145)
Total Bilirubin: 0.4 mg/dL (ref 0.3–1.2)
Total Protein: 5.9 g/dL — ABNORMAL LOW (ref 6.5–8.1)

## 2017-07-16 LAB — CBC WITH DIFFERENTIAL/PLATELET
Basophils Absolute: 0 10*3/uL (ref 0.0–0.1)
Basophils Relative: 0 %
Eosinophils Absolute: 0.3 10*3/uL (ref 0.0–0.7)
Eosinophils Relative: 6 %
HCT: 29.5 % — ABNORMAL LOW (ref 39.0–52.0)
Hemoglobin: 9.1 g/dL — ABNORMAL LOW (ref 13.0–17.0)
Lymphocytes Relative: 35 %
Lymphs Abs: 1.9 10*3/uL (ref 0.7–4.0)
MCH: 30.7 pg (ref 26.0–34.0)
MCHC: 30.8 g/dL (ref 30.0–36.0)
MCV: 99.7 fL (ref 78.0–100.0)
Monocytes Absolute: 0.8 10*3/uL (ref 0.1–1.0)
Monocytes Relative: 14 %
Neutro Abs: 2.5 10*3/uL (ref 1.7–7.7)
Neutrophils Relative %: 45 %
Platelets: 168 10*3/uL (ref 150–400)
RBC: 2.96 MIL/uL — ABNORMAL LOW (ref 4.22–5.81)
RDW: 15.8 % — ABNORMAL HIGH (ref 11.5–15.5)
WBC: 5.5 10*3/uL (ref 4.0–10.5)

## 2017-07-16 NOTE — Patient Instructions (Addendum)
[name] at [facility] Discharge Instructions  RECOMMENDATIONS MADE BY THE CONSULTANT AND ANY TEST RESULTS WILL BE SENT TO YOUR REFERRING PHYSICIAN.  Seen by Dr. Walden Field today.  Labs reviewed in detail , reviewed SPEP in detail.  Follow up in end of March with Myeloma labs Continue to stay off the [name] till we see you in March We need a stool sample to test for C-Diff.  Bring back when after its collected.  Thank you for choosing [facility] at [facility] to provide your oncology and hematology care.  To afford each patient quality time with our provider, please arrive at least 15 minutes before your scheduled appointment time.    If you have a lab appointment with the [facility] please come in thru the  Main Entrance and check in at the main information desk  You need to re-schedule your appointment should you arrive 10 or more minutes late.  We strive to give you quality time with our providers, and arriving late affects you and other patients whose appointments are after yours.  Also, if you no show three or more times for appointments you may be dismissed from the clinic at the providers discretion.     Again, thank you for choosing [facility].  Our hope is that these requests will decrease the amount of time that you wait before being seen by our physicians.       _____________________________________________________________  Should you have questions after your visit to [facility], please contact our office at [phone] between the hours of 8:30 a.m. and 4:30 p.m.  Voicemails left after 4:30 p.m. will not be returned until the following business day.  For prescription refill requests, have your pharmacy contact our office.       Resources For Cancer Patients and their Caregivers ? American Cancer Society: Can assist with transportation, wigs, general needs, runs Look Good Feel Better.         5638601049 ? Cancer Care: Provides financial assistance, online support groups, medication/co-pay assistance.  1-800-813-HOPE 724-104-9416) ? [facility] Assists [place] cancer patients and their families through emotional , educational and financial support.  412-063-6995 ? [place] DSS Where to apply for food stamps, Medicaid and utility assistance. 3613452025 ? [facility]: Transportation to medical appointments. 863-819-6765 ? Social Security Administration: May apply for disability if have a Stage IV cancer. (236)006-1793 (857)847-6231 ? [employer], Disability and Transit Services: Assists with nutrition, care and transit needs. [place] Support Programs: @10RELATIVEDAYS @ > Cancer Support Group  2nd Tuesday of the month 1pm-2pm, Journey Room  > Creative Journey  3rd Tuesday of the month 1130am-1pm, Journey Room  > Look Good Feel Better  1st Wednesday of the month 10am-12 noon, Journey Room (Call [place] to register [phone])

## 2017-07-24 ENCOUNTER — Other Ambulatory Visit (HOSPITAL_COMMUNITY): Payer: Self-pay | Admitting: *Deleted

## 2017-07-24 DIAGNOSIS — C9000 Multiple myeloma not having achieved remission: Secondary | ICD-10-CM

## 2017-07-24 DIAGNOSIS — R197 Diarrhea, unspecified: Secondary | ICD-10-CM

## 2017-07-24 LAB — C DIFFICILE QUICK SCREEN W PCR REFLEX
C Diff antigen: NEGATIVE
C Diff interpretation: NOT DETECTED
C Diff toxin: NEGATIVE

## 2017-07-28 ENCOUNTER — Encounter: Payer: Self-pay | Admitting: General Surgery

## 2017-07-28 ENCOUNTER — Ambulatory Visit: Payer: BLUE CROSS/BLUE SHIELD | Admitting: General Surgery

## 2017-07-28 VITALS — BP 163/69 | HR 81 | Temp 97.3°F | Ht 74.0 in | Wt 380.0 lb

## 2017-07-28 DIAGNOSIS — L723 Sebaceous cyst: Secondary | ICD-10-CM

## 2017-07-28 NOTE — Progress Notes (Signed)
Subjective:     Roda Shutters  Here for follow-up of sebaceous cyst on his back.  Patient has had decreased drainage noted with expression by wife.  Currently has 4 out of 10 pain, but this is related to multiple medical issues. Objective:    BP (!) 163/69   Pulse 81   Temp (!) 97.3 F (36.3 C)   Ht 6\' 2"  (1.88 m)   Wt (!) 380 lb (172.4 kg)   BMI 48.79 kg/m   General:  alert, cooperative and no distress  Back with resolving area of multiple punctum is present.  Minimal sebum expressed.  No purulent drainage noted.  Minimal erythema.  No induration.     Assessment:    Sebaceous cyst on back, resolving.  This does appear to be baseline.  No need for surgical intervention.    Plan:  Would express wound twice a week.  We will follow-up as needed should condition worsen.

## 2017-07-29 NOTE — Progress Notes (Signed)
Diagnosis Multiple myeloma, remission status unspecified ([place]) - Plan: CBC with Differential/Platelet, Comprehensive metabolic panel, Lactate dehydrogenase, Ferritin, Multiple Myeloma Panel (SPEP&IFE w/QIG), C difficile quick screen w PCR reflex  Diarrhea, unspecified type - Plan: CBC with Differential/Platelet, Comprehensive metabolic panel, Lactate dehydrogenase, Ferritin, Multiple Myeloma Panel (SPEP&IFE w/QIG), C difficile quick screen w PCR reflex  Staging Cancer Staging No matching staging information was found for the patient.  Assessment and Plan:  1.  Multiple myeloma ([place]).  57 y.o. Male with diagnosis of kappa light chain multiple myeloma, previously in remissionon maintenance therapy with lenalidomide and dexamethasone.  Treatment was previously interrupted due to recurrent and progressively severe infections culminating in development of Fournier's gangrene in November 2018 which required several surgeries and extensive antibiotic courses for control.  Patient was followed by Dr. Lebron Conners and he discussed with the patient he was not ready to resume therapy due to severity of his infections.  I discussed with the patient his recent serum protein electrophoresis shows no monoclonal spike.  Review of records appears to indicate that he began therapy in 2013.  His last bone biopsy was done November 2018 and was negative.  Determination should be made now regarding whether or not the patient needs to remain on ongoing  maintenance therapy as his recent SPEP and bone marrow biopsy have been negative.  He will establish with [name] to review his records and determine if he will need ongoing therapy.   2.  Hypertension.  He should continue to follow with his primary care physician.  3.  Anemia.  Hemoglobin is 9.1.  He also has evidence of renal insufficiency with a creatinine of 2.21.  He should continue to follow-up with nephrology as directed.  4.  Infections.  He has a diagnosis of  Fournier gangrene.  Dr. Lebron Conners  indicated the patient's treatment is on hold due to progressively severe infections culminating in Fournier's gangrene in November 2018 which required several surgeries and extensive antibiotics.  He should continue to follow-up with infectious disease or his primary care physician as directed.  5.  Diarrhea.  Stool for C. difficile was checked and was negative.  He should continue to follow-up with his primary care physician and GI as directed.  Interval History:  57 y.o. Male with diagnosis of kappa light chain multiple myeloma, previously in remission on maintenance therapy with lenalidomide and dexamethasone.  Treatment was previously interrupted due to recurrent and progressively severe infections culminating in development of Fournier's gangrene in November 2018 which required several surgeries and extensive antibiotic courses for control.    Current Status: Patient is seen today for follow-up.  He is here today to go over recent lab studies.  He is expressing some concern about being off therapy despite being told by Dr. Lebron Conners therapy was being held due to severe infections.   Problem List Patient Active Problem List   Diagnosis Date Noted  . Fournier gangrene [N49.3] 04/14/2017  . Epididymitis [N45.1] 04/13/2017  . Acute epididymitis [N45.1] 04/13/2017  . Pneumonia [J18.9] 06/10/2016  . CAP (community acquired pneumonia) [J18.9] 06/10/2016  . Cellulitis of left lower extremity [L03.116]   . Hypogammaglobulinemia ([place]) [D80.1] 03/09/2016  . Hypokalemia [E87.6] 03/09/2016  . Diaphragmatic hernia without obstruction [K44.9] 02/26/2016  . Chronic systolic heart failure ([place]) [I50.22] 12/19/2013  . Anemia, normocytic normochromic [D64.9] 10/09/2012  . Chronic pancreatitis ([place]) [K86.1] 10/07/2012  . Atrial flutter ([place]) [I48.92] 09/17/2012  . DDD (degenerative disc disease), cervical [M50.30] 03/18/2012  . Arteriosclerotic cardiovascular disease (  ASCVD)  [I25.10]   . Hypertension [I10]   . Hyperlipidemia [E78.5]   . Multiple myeloma ([place]) [C90.00] 07/01/2011  . Morbid obesity ([place]) [E66.01] 04/29/2010  . Insulin dependent diabetes mellitus ([place]) [E11.9, Z79.4] 11/15/2008  . Sleep apnea [G47.30] 11/15/2008    Past Medical History Past Medical History:  Diagnosis Date  . Anemia   . Arteriosclerotic cardiovascular disease (ASCVD)    MI-2000s; stent to the proximal LAD and diagonal in 2001; stress nuclear in 2008-impaired exercise capacity, left ventricular dilatation, moderately to severely depressed EF, apical, inferior and anteroseptal scar  . Arthritis   . Atrial flutter ([place])   . 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 Cellulitis of leg    both legs  . Chronic diarrhea   . Chronic kidney disease, stage 3, mod decreased GFR ([facility])    Creatinine of 1.84 in 06/2011 and 1.5 in 07/2011  . Diabetes mellitus    Insulin  . Dysrhythmia    AFlutter  . GERD (gastroesophageal reflux disease)   . Gout   . Hyperlipidemia   . Hypertension   . Injection site reaction   . Multiple myeloma 07/01/2011  . Myocardial infarction ([place]) 2000  . Obesity   . Pedal edema    Venous insufficiency  . Sleep apnea    uses cpap  . Ulcer     Past Surgical History Past Surgical History:  Procedure Laterality Date  . ABSCESS DRAINAGE     Scrotal  . BIOPSY  01/02/2012   Procedure: BIOPSY;  Surgeon: Rogene Houston, MD;  [facility];  Service: Endoscopy;  Laterality: N/A;  . BONE MARROW BIOPSY  05/13/11  . CARDIAC CATHETERIZATION     cardiac stent  . CARDIOVERSION N/A 10/13/2012   Procedure: CARDIOVERSION;  Surgeon: Yehuda Savannah, MD;  Location: [facility];  Service: Cardiovascular;  Laterality: N/A;  . CATARACT EXTRACTION W/PHACO Left 02/13/2014   Procedure: CATARACT EXTRACTION PHACO AND INTRAOCULAR LENS PLACEMENT ([place]);  Surgeon: Tonny Branch, MD;  Location: [facility];  Service: Ophthalmology;  Laterality: Left;  CDE:  7.67  . CATARACT EXTRACTION  W/PHACO Right 03/02/2014   Procedure: CATARACT EXTRACTION PHACO AND INTRAOCULAR LENS PLACEMENT RIGHT EYE CDE=16.81;  Surgeon: Tonny Branch, MD;  Location: [facility];  Service: Ophthalmology;  Laterality: Right;  . COLONOSCOPY  11/28/2011   Procedure: COLONOSCOPY;  Surgeon: Rogene Houston, MD;  [facility];  Service: Endoscopy;  Laterality: N/A;  930  . ESOPHAGOGASTRODUODENOSCOPY  01/02/2012   Procedure: ESOPHAGOGASTRODUODENOSCOPY (EGD);  Surgeon: Rogene Houston, MD;  [facility];  Service: Endoscopy;  Laterality: N/A;  100  . ESOPHAGOGASTRODUODENOSCOPY N/A 09/20/2012   Procedure: ESOPHAGOGASTRODUODENOSCOPY (EGD);  Surgeon: Rogene Houston, MD;  [facility];  Service: Endoscopy;  Laterality: N/A;  . EUS N/A 10/07/2012   Procedure: UPPER ENDOSCOPIC ULTRASOUND (EUS) LINEAR;  Surgeon: Milus Banister, MD;  [facility];  Service: Endoscopy;  Laterality: N/A;  . INCISION AND DRAINAGE ABSCESS N/A 04/14/2017   Procedure: INCISION AND DRAINAGE ABSCESS;  Surgeon: Ceasar Mons, MD;  Location: [facility];  Service: Urology;  Laterality: N/A;  . INCISION AND DRAINAGE ABSCESS ANAL    . IRRIGATION AND DEBRIDEMENT ABSCESS N/A 04/15/2017   Procedure: DEBRIDEMENT SCROTAL WOUND AND DRESSING CHANGE;  Surgeon: Ceasar Mons, MD;  [facility];  Service: Urology;  Laterality: N/A;  . IRRIGATION AND DEBRIDEMENT ABSCESS N/A 04/17/2017   Procedure: IRRIGATION AND DEBRIDEMENT ABSCESS;  Surgeon: Ceasar Mons, MD;  Location:  [facility];  Service: Urology;  Laterality: N/A;  RM 3  . LAPAROSCOPIC GASTRIC BANDING  2006   has been removed  . OSTECTOMY Right 04/08/2017   Procedure: OSTECTOMY RIGHT GREAT TOE;  Surgeon: Caprice Beaver, DPM;  Location: [facility];  Service: Podiatry;  Laterality: Right;  . PORT-A-CATH REMOVAL Left 12/07/2012   Procedure: REMOVAL PORT-A-CATH;  Surgeon: Scherry Ran, MD;  Location[facility];  Service: General;  Laterality:  Left;  . PORTACATH PLACEMENT  07/07/2011   Procedure: INSERTION PORT-A-CATH;  Surgeon: Scherry Ran, MD;  Location: [facility];  Service: General;  Laterality: N/A;  . PORTACATH PLACEMENT N/A 12/07/2012   Procedure: INSERTION PORT-A-CATH;  Surgeon: Scherry Ran, MD;  Location: [facility];  Service: General;  Laterality: N/A;  Attempted portacath placement on left and right side  . WOUND DEBRIDEMENT Right 04/08/2017   Procedure: EXCISION ULCERATION RIGHT GREAT TOE;  Surgeon: Caprice Beaver, DPM;  [facility];  Service: Podiatry;  Laterality: Right;  . WRIST SURGERY     Left; removal of bone fragment    Family History Family History  Problem Relation Age of Onset  . Heart disease Mother   . Cancer Mother   . Diabetes Father   . Arthritis Unknown   . Anesthesia problems Neg Hx   . Hypotension Neg Hx   . Malignant hyperthermia Neg Hx   . Pseudochol deficiency Neg Hx      Social History  reports that he quit smoking about 16 years ago. His smoking use included cigarettes and cigars. He has a 0.25 pack-year smoking history. he has never used smokeless tobacco. He reports that he does not drink alcohol or use drugs.  Medications  Current Outpatient Medications:  .  acyclovir (ZOVIRAX) 400 MG tablet, Take 1 tablet (400 mg total) by mouth every morning., Disp: 30 tablet, Rfl: 5 .  allopurinol (ZYLOPRIM) 300 MG tablet, TAKE ONE TABLET BY MOUTH ONCE DAILY., Disp: 30 tablet, Rfl: 0 .  Artificial Tear Ointment (DRY EYES OP), Place 1 drop into both eyes as needed (dry eyes)., Disp: , Rfl:  .  atorvastatin (LIPITOR) 80 MG tablet, TAKE ONE TABLET BY MOUTH AT BEDTIME., Disp: 90 tablet, Rfl: 3 .  calcitRIOL (ROCALTROL) 0.25 MCG capsule, Take 0.25 mcg by mouth 3 (three) times a week. Monday, Wednesday, Friday., Disp: , Rfl:  .  calcium carbonate (OS-CAL) 600 MG TABS tablet, Take 1,800 mg by mouth 2 (two) times daily. , Disp: , Rfl:  .  colchicine 0.6 MG tablet, Take 1 tablet (0.6 mg total)  by mouth 2 (two) times daily., Disp: 10 tablet, Rfl: 0 .  dicyclomine (BENTYL) 20 MG tablet, TAKE 1 TABLET BY MOUTH BEFORE MEALS 3 TIMES DAILY., Disp: 90 tablet, Rfl: 1 .  Eluxadoline (VIBERZI) 100 MG TABS, Take 100 mg 2 (two) times daily with a meal by mouth. , Disp: , Rfl:  .  gabapentin (NEURONTIN) 300 MG capsule, Take 1 capsule (300 mg total) by mouth 2 (two) times daily., Disp: 60 capsule, Rfl: 0 .  insulin detemir (LEVEMIR) 100 UNIT/ML injection, Inject 0.1 mLs (10 Units total) into the skin every morning., Disp: 10 mL, Rfl: 11 .  insulin lispro (HUMALOG) 100 UNIT/ML injection, Inject 0-0.1 mLs (0-10 Units total) into the skin 2 (two) times daily with a meal. Sliding Scale per patient  CBG 150-200: 1 units CBG 201-275:  3 units CBG 275-350: 5 units 351-425: 7 units 425-500: 9 units 500+: 10 units, Disp: 10 mL, Rfl:  0 .  lisinopril (PRINIVIL,ZESTRIL) 5 MG tablet, Take by mouth., Disp: , Rfl:  .  loratadine (CLARITIN) 10 MG tablet, Take 10 mg by mouth every morning. , Disp: , Rfl:  .  Magnesium Cl-Calcium Carbonate (SLOW-MAG PO), Take 1 tablet by mouth every morning., Disp: , Rfl:  .  metoprolol succinate (TOPROL-XL) 25 MG 24 hr tablet, TAKE (1) TABLET BY MOUTH ONCE DAILY., Disp: 90 tablet, Rfl: 3 .  Multiple Vitamins-Minerals (MULTIVITAMINS THER. W/MINERALS) TABS, Take 1 tablet by mouth daily. , Disp: , Rfl:  .  nitroGLYCERIN (NITROSTAT) 0.4 MG SL tablet, DISSOLVE 1 TABLET UNDER TONGUE EVERY 5 MINUTES UP TO 15 MIN FOR CHESTPAIN. IF NO RELIEF CALL 911., Disp: 25 tablet, Rfl: 0 .  omeprazole (PRILOSEC) 40 MG capsule, Take 1 capsule (40 mg total) by mouth daily., Disp: 60 capsule, Rfl: 1 .  potassium chloride SA (K-DUR,KLOR-CON) 20 MEQ tablet, Take 2 tablets (40 mEq total) by mouth 2 (two) times daily., Disp: 120 tablet, Rfl: 2 .  sulfamethoxazole-trimethoprim (BACTRIM DS,SEPTRA DS) 800-160 MG tablet, TAKE ONE TABLET BY MOUTH EVERY MONDAY, WEDNESDAY, AND FRIDAY., Disp: 12 tablet, Rfl: 0 .  torsemide  (DEMADEX) 20 MG tablet, TAKE 2 TABLETS BY MOUTH EACH MORNING AND 1 TABLET EACH EVENING., Disp: 240 tablet, Rfl: 0 .  Vitamin D, Ergocalciferol, (DRISDOL) 50000 units CAPS capsule, TAKE 1 CAPSULE BY MOUTH EVERY THIRTY DAYS., Disp: 3 capsule, Rfl: PRN .  XARELTO 20 MG TABS tablet, TAKE 1 TABLET BY MOUTH DAILY., Disp: 30 tablet, Rfl: 11 .  dexamethasone (DECADRON) 4 MG tablet, TAKE 5 TABLETS TWICE A DAY EVERY FRIDAY. (Patient not taking: Reported on 07/16/2017), Disp: 40 tablet, Rfl: 0 .  lenalidomide ([name]) 5 MG capsule, Take 1 capsule by mouth daily for 7 days, followed by 7 days off. (Patient not taking: Reported on 07/16/2017), Disp: 14 capsule, Rfl: 0 .  sulfamethoxazole-trimethoprim (BACTRIM DS,SEPTRA DS) 800-160 MG tablet, TAKE ONE TABLET BY MOUTH EVERY MONDAY, WEDNESDAY, AND FRIDAY., Disp: 12 tablet, Rfl: 0 No current facility-administered medications for this visit.   Facility-Administered Medications Ordered in Other Visits:  .  sodium chloride 0.9 % injection 10 mL, 10 mL, Intravenous, Once, Farrel Gobble, MD  Allergies Patient has no known allergies.  Review of Systems Review of Systems - Oncology ROS as per HPI otherwise 12 point ROS is negative.   Physical Exam  Vitals:  T 97.3 HR 60 BP 83/51 Wt 368 pounds Constitutional: Well-developed, well-nourished, and in no distress.   HENT: Head: Normocephalic and atraumatic.  Mouth/Throat: No oropharyngeal exudate. Mucosa moist. Eyes: Pupils are equal, round, and reactive to light. Conjunctivae are normal. No scleral icterus.  Neck: Normal range of motion. Neck supple. No JVD present.  Cardiovascular: Normal rate, regular rhythm and normal heart sounds.  Exam reveals no gallop and no friction rub.   No murmur heard. Pulmonary/Chest: Effort normal and breath sounds normal. No respiratory distress. No wheezes.No rales.  Abdominal: Soft. Bowel sounds are normal. No distension. There is no tenderness. There is no guarding.   Musculoskeletal: Evidence of infections and surgeries to extremities.   Lymphadenopathy: No cervical, axillaryor supraclavicular adenopathy.  Neurological: Alert and oriented to person, place, and time. No cranial nerve deficit.  Skin: Skin is warm and dry. No rash noted. No erythema. No pallor.  Psychiatric: Affect and judgment normal.   Labs Appointment on 07/16/2017  Component Date Value Ref Range Status  . Sodium 07/16/2017 141  135 - 145 mmol/L Final  . Potassium 07/16/2017 3.9  3.5 - 5.1 mmol/L Final  . Chloride 07/16/2017 108  101 - 111 mmol/L Final  . CO2 07/16/2017 23  22 - 32 mmol/L Final  . Glucose, Bld 07/16/2017 131* 65 - 99 mg/dL Final  . BUN 07/16/2017 27* 6 - 20 mg/dL Final  . Creatinine, Ser 07/16/2017 2.21* 0.61 - 1.24 mg/dL Final  . Calcium 07/16/2017 8.8* 8.9 - 10.3 mg/dL Final  . Total Protein 07/16/2017 5.9* 6.5 - 8.1 g/dL Final  . Albumin 07/16/2017 3.0* 3.5 - 5.0 g/dL Final  . AST 07/16/2017 32  15 - 41 U/L Final  . ALT 07/16/2017 25  17 - 63 U/L Final  . Alkaline Phosphatase 07/16/2017 80  38 - 126 U/L Final  . Total Bilirubin 07/16/2017 0.4  0.3 - 1.2 mg/dL Final  . GFR calc non Af Amer 07/16/2017 31* >60 mL/min Final  . GFR calc Af Amer 07/16/2017 36* >60 mL/min Final   Comment: (NOTE) The eGFR has been calculated using the CKD EPI equation. This calculation has not been validated in all clinical situations. eGFR's persistently <60 mL/min signify possible Chronic Kidney Disease.   Georgiann Hahn gap 07/16/2017 10  5 - 15 Final   Performed at [facility], [address], [place], [place] [address]  . WBC 07/16/2017 5.5  4.0 - 10.5 K/uL Final  . RBC 07/16/2017 2.96* 4.22 - 5.81 MIL/uL Final  . Hemoglobin 07/16/2017 9.1* 13.0 - 17.0 g/dL Final  . HCT 07/16/2017 29.5* 39.0 - 52.0 % Final  . MCV 07/16/2017 99.7  78.0 - 100.0 fL Final  . MCH 07/16/2017 30.7  26.0 - 34.0 pg Final  . MCHC 07/16/2017 30.8  30.0 - 36.0 g/dL Final  . RDW 07/16/2017 15.8* 11.5 - 15.5  % Final  . Platelets 07/16/2017 168  150 - 400 K/uL Final  . Neutrophils Relative % 07/16/2017 45  % Final  . Neutro Abs 07/16/2017 2.5  1.7 - 7.7 K/uL Final  . Lymphocytes Relative 07/16/2017 35  % Final  . Lymphs Abs 07/16/2017 1.9  0.7 - 4.0 K/uL Final  . Monocytes Relative 07/16/2017 14  % Final  . Monocytes Absolute 07/16/2017 0.8  0.1 - 1.0 K/uL Final  . Eosinophils Relative 07/16/2017 6  % Final  . Eosinophils Absolute 07/16/2017 0.3  0.0 - 0.7 K/uL Final  . Basophils Relative 07/16/2017 0  % Final  . Basophils Absolute 07/16/2017 0.0  0.0 - 0.1 K/uL Final   Performed at [facility], [address], [place], [place] [address]     Pathology Orders Placed This Encounter  Procedures  . C difficile quick screen w PCR reflex    Standing Status:   Future    Number of Occurrences:   1    Standing Expiration Date:   07/16/2018    Order Specific Question:   Is your patient experiencing loose or watery stools (3 or more in 24 hours)?    Answer:   Yes    Order Specific Question:   Has the patient received laxatives in the last 24 hours?    Answer:   No    Order Specific Question:   Has a negative Cdiff test resulted in the last 7 days?    Answer:   No  . CBC with Differential/Platelet    Standing Status:   Future    Standing Expiration Date:   07/16/2018  . Comprehensive metabolic panel    Standing Status:   Future    Standing Expiration Date:  07/16/2018  . Lactate dehydrogenase    Standing Status:   Future    Standing Expiration Date:   07/16/2018  . Ferritin    Standing Status:   Future    Standing Expiration Date:   07/16/2018  . Multiple Myeloma Panel (SPEP&IFE w/QIG)    Standing Status:   Future    Standing Expiration Date:   07/16/2018       Zoila Shutter MD

## 2017-07-30 ENCOUNTER — Emergency Department (HOSPITAL_COMMUNITY): Payer: BLUE CROSS/BLUE SHIELD

## 2017-07-30 ENCOUNTER — Other Ambulatory Visit: Payer: Self-pay

## 2017-07-30 ENCOUNTER — Inpatient Hospital Stay (HOSPITAL_COMMUNITY)
Admission: EM | Admit: 2017-07-30 | Discharge: 2017-08-04 | DRG: 853 | Disposition: A | Discharge status: Home Health | Payer: BLUE CROSS/BLUE SHIELD | Attending: Internal Medicine | Admitting: Internal Medicine

## 2017-07-30 ENCOUNTER — Other Ambulatory Visit (HOSPITAL_COMMUNITY): Payer: Self-pay

## 2017-07-30 ENCOUNTER — Encounter (HOSPITAL_COMMUNITY): Payer: Self-pay | Admitting: Emergency Medicine

## 2017-07-30 DIAGNOSIS — N136 Pyonephrosis: Secondary | ICD-10-CM | POA: Diagnosis present

## 2017-07-30 DIAGNOSIS — I2510 Atherosclerotic heart disease of native coronary artery without angina pectoris: Secondary | ICD-10-CM | POA: Diagnosis present

## 2017-07-30 DIAGNOSIS — E2749 Other adrenocortical insufficiency: Secondary | ICD-10-CM | POA: Diagnosis present

## 2017-07-30 DIAGNOSIS — R531 Weakness: Secondary | ICD-10-CM

## 2017-07-30 DIAGNOSIS — I130 Hypertensive heart and chronic kidney disease with heart failure and stage 1 through stage 4 chronic kidney disease, or unspecified chronic kidney disease: Secondary | ICD-10-CM | POA: Diagnosis present

## 2017-07-30 DIAGNOSIS — N390 Urinary tract infection, site not specified: Secondary | ICD-10-CM

## 2017-07-30 DIAGNOSIS — E875 Hyperkalemia: Secondary | ICD-10-CM | POA: Diagnosis present

## 2017-07-30 DIAGNOSIS — Z7952 Long term (current) use of systemic steroids: Secondary | ICD-10-CM

## 2017-07-30 DIAGNOSIS — I10 Essential (primary) hypertension: Secondary | ICD-10-CM | POA: Diagnosis present

## 2017-07-30 DIAGNOSIS — IMO0001 Reserved for inherently not codable concepts without codable children: Secondary | ICD-10-CM

## 2017-07-30 DIAGNOSIS — Z794 Long term (current) use of insulin: Secondary | ICD-10-CM

## 2017-07-30 DIAGNOSIS — L8990 Pressure ulcer of unspecified site, unspecified stage: Secondary | ICD-10-CM

## 2017-07-30 DIAGNOSIS — G9341 Metabolic encephalopathy: Secondary | ICD-10-CM | POA: Diagnosis present

## 2017-07-30 DIAGNOSIS — Z87891 Personal history of nicotine dependence: Secondary | ICD-10-CM

## 2017-07-30 DIAGNOSIS — E119 Type 2 diabetes mellitus without complications: Secondary | ICD-10-CM

## 2017-07-30 DIAGNOSIS — L89152 Pressure ulcer of sacral region, stage 2: Secondary | ICD-10-CM | POA: Diagnosis present

## 2017-07-30 DIAGNOSIS — Z6841 Body Mass Index (BMI) 40.0 and over, adult: Secondary | ICD-10-CM

## 2017-07-30 DIAGNOSIS — I252 Old myocardial infarction: Secondary | ICD-10-CM

## 2017-07-30 DIAGNOSIS — Z961 Presence of intraocular lens: Secondary | ICD-10-CM | POA: Diagnosis present

## 2017-07-30 DIAGNOSIS — A4159 Other Gram-negative sepsis: Secondary | ICD-10-CM | POA: Diagnosis not present

## 2017-07-30 DIAGNOSIS — G4733 Obstructive sleep apnea (adult) (pediatric): Secondary | ICD-10-CM | POA: Diagnosis present

## 2017-07-30 DIAGNOSIS — N183 Chronic kidney disease, stage 3 (moderate): Secondary | ICD-10-CM | POA: Diagnosis present

## 2017-07-30 DIAGNOSIS — M109 Gout, unspecified: Secondary | ICD-10-CM | POA: Diagnosis present

## 2017-07-30 DIAGNOSIS — D649 Anemia, unspecified: Secondary | ICD-10-CM | POA: Diagnosis present

## 2017-07-30 DIAGNOSIS — Z9841 Cataract extraction status, right eye: Secondary | ICD-10-CM

## 2017-07-30 DIAGNOSIS — E118 Type 2 diabetes mellitus with unspecified complications: Secondary | ICD-10-CM

## 2017-07-30 DIAGNOSIS — Z955 Presence of coronary angioplasty implant and graft: Secondary | ICD-10-CM

## 2017-07-30 DIAGNOSIS — E876 Hypokalemia: Secondary | ICD-10-CM

## 2017-07-30 DIAGNOSIS — Z7901 Long term (current) use of anticoagulants: Secondary | ICD-10-CM

## 2017-07-30 DIAGNOSIS — I5022 Chronic systolic (congestive) heart failure: Secondary | ICD-10-CM | POA: Diagnosis present

## 2017-07-30 DIAGNOSIS — E785 Hyperlipidemia, unspecified: Secondary | ICD-10-CM | POA: Diagnosis present

## 2017-07-30 DIAGNOSIS — E1122 Type 2 diabetes mellitus with diabetic chronic kidney disease: Secondary | ICD-10-CM | POA: Diagnosis present

## 2017-07-30 DIAGNOSIS — A419 Sepsis, unspecified organism: Secondary | ICD-10-CM

## 2017-07-30 DIAGNOSIS — K219 Gastro-esophageal reflux disease without esophagitis: Secondary | ICD-10-CM | POA: Diagnosis present

## 2017-07-30 DIAGNOSIS — D631 Anemia in chronic kidney disease: Secondary | ICD-10-CM | POA: Diagnosis present

## 2017-07-30 DIAGNOSIS — C9000 Multiple myeloma not having achieved remission: Secondary | ICD-10-CM | POA: Diagnosis present

## 2017-07-30 DIAGNOSIS — D6489 Other specified anemias: Secondary | ICD-10-CM | POA: Diagnosis present

## 2017-07-30 DIAGNOSIS — K449 Diaphragmatic hernia without obstruction or gangrene: Secondary | ICD-10-CM | POA: Diagnosis present

## 2017-07-30 DIAGNOSIS — I4892 Unspecified atrial flutter: Secondary | ICD-10-CM | POA: Diagnosis present

## 2017-07-30 DIAGNOSIS — Z79899 Other long term (current) drug therapy: Secondary | ICD-10-CM

## 2017-07-30 DIAGNOSIS — N179 Acute kidney failure, unspecified: Secondary | ICD-10-CM

## 2017-07-30 LAB — URINALYSIS, ROUTINE W REFLEX MICROSCOPIC
Bilirubin Urine: NEGATIVE
Glucose, UA: NEGATIVE mg/dL
Ketones, ur: NEGATIVE mg/dL
Nitrite: POSITIVE — AB
Protein, ur: 100 mg/dL — AB
Specific Gravity, Urine: 1.012 (ref 1.005–1.030)
pH: 5 (ref 5.0–8.0)

## 2017-07-30 LAB — LACTIC ACID, PLASMA: Lactic Acid, Venous: 3.1 mmol/L (ref 0.5–1.9)

## 2017-07-30 LAB — CBC WITH DIFFERENTIAL/PLATELET
Basophils Absolute: 0 10*3/uL (ref 0.0–0.1)
Basophils Relative: 0 %
Eosinophils Absolute: 0 10*3/uL (ref 0.0–0.7)
Eosinophils Relative: 0 %
HCT: 29.6 % — ABNORMAL LOW (ref 39.0–52.0)
Hemoglobin: 9.4 g/dL — ABNORMAL LOW (ref 13.0–17.0)
Lymphocytes Relative: 8 %
Lymphs Abs: 0.7 10*3/uL (ref 0.7–4.0)
MCH: 30.7 pg (ref 26.0–34.0)
MCHC: 31.8 g/dL (ref 30.0–36.0)
MCV: 96.7 fL (ref 78.0–100.0)
Monocytes Absolute: 0.4 10*3/uL (ref 0.1–1.0)
Monocytes Relative: 5 %
Neutro Abs: 8.1 10*3/uL (ref 1.7–7.7)
Neutrophils Relative %: 87 %
Platelets: 133 10*3/uL — ABNORMAL LOW (ref 150–400)
RBC: 3.06 MIL/uL — ABNORMAL LOW (ref 4.22–5.81)
RDW: 16.9 % — ABNORMAL HIGH (ref 11.5–15.5)
WBC: 9.2 10*3/uL (ref 4.0–10.5)

## 2017-07-30 LAB — I-STAT CG4 LACTIC ACID, ED
Lactic Acid, Venous: 3.47 mmol/L (ref 0.5–1.9)
Lactic Acid, Venous: 1.36 mmol/L (ref 0.5–1.9)

## 2017-07-30 LAB — COMPREHENSIVE METABOLIC PANEL
ALT: 26 U/L (ref 17–63)
AST: 62 U/L — ABNORMAL HIGH (ref 15–41)
Albumin: 2.7 g/dL — ABNORMAL LOW (ref 3.5–5.0)
Alkaline Phosphatase: 48 U/L (ref 38–126)
Anion gap: 10 (ref 5–15)
BUN: 52 mg/dL — ABNORMAL HIGH (ref 6–20)
CO2: 17 mmol/L — ABNORMAL LOW (ref 22–32)
Calcium: 8 mg/dL — ABNORMAL LOW (ref 8.9–10.3)
Chloride: 105 mmol/L (ref 101–111)
Creatinine, Ser: 3.81 mg/dL — ABNORMAL HIGH (ref 0.61–1.24)
GFR calc Af Amer: 19 mL/min — ABNORMAL LOW (ref >60)
GFR calc non Af Amer: 16 mL/min — ABNORMAL LOW (ref >60)
Glucose, Bld: 97 mg/dL (ref 65–99)
Potassium: 5.8 mmol/L — ABNORMAL HIGH (ref 3.5–5.1)
Sodium: 132 mmol/L — ABNORMAL LOW (ref 135–145)
Total Bilirubin: 0.6 mg/dL (ref 0.3–1.2)
Total Protein: 5.8 g/dL — ABNORMAL LOW (ref 6.5–8.1)

## 2017-07-30 LAB — INFLUENZA PANEL BY PCR (TYPE A & B)
Influenza A By PCR: NEGATIVE
Influenza B By PCR: NEGATIVE

## 2017-07-30 MED ORDER — ACETAMINOPHEN 325 MG PO TABS: 650.0000 mg | ORAL_TABLET | Freq: Once | ORAL | Status: DC

## 2017-07-30 MED ORDER — SODIUM CHLORIDE 0.9 % IV SOLN
Freq: Once | INTRAVENOUS | Status: AC
  Administered 2017-07-30: 18:00:00 via INTRAVENOUS

## 2017-07-30 MED ORDER — AZITHROMYCIN 500 MG IV SOLR
500.0000 mg | Freq: Once | INTRAVENOUS | Status: AC
  Administered 2017-07-30: 500 mg via INTRAVENOUS
  Filled 2017-07-30: qty 500

## 2017-07-30 MED ORDER — SODIUM CHLORIDE 0.9 % IV BOLUS (SEPSIS)
1000.0000 mL | Freq: Once | INTRAVENOUS | Status: AC
  Administered 2017-07-30: 1000 mL via INTRAVENOUS

## 2017-07-30 MED ORDER — SODIUM CHLORIDE 0.9 % IV BOLUS (SEPSIS)
1000.0000 mL | Freq: Once | INTRAVENOUS | Status: AC
  Administered 2017-07-31: 1000 mL via INTRAVENOUS

## 2017-07-30 MED ORDER — SODIUM CHLORIDE 0.9 % IV SOLN
2.0000 g | Freq: Once | INTRAVENOUS | Status: AC
  Administered 2017-07-30: 2 g via INTRAVENOUS
  Filled 2017-07-30: qty 20

## 2017-07-30 MED ORDER — SODIUM CHLORIDE 0.9 % IV BOLUS (SEPSIS): 500.0000 mL | Freq: Once | INTRAVENOUS | Status: DC

## 2017-07-30 MED ORDER — ACETAMINOPHEN 500 MG PO TABS
1000.0000 mg | ORAL_TABLET | Freq: Once | ORAL | Status: AC
  Administered 2017-07-30: 1000 mg via ORAL
  Filled 2017-07-30: qty 2

## 2017-07-30 NOTE — ED Notes (Signed)
Stopped IV fluids at this time per MD Jeneen Rinks.

## 2017-07-30 NOTE — ED Notes (Signed)
MD Jeneen Rinks notified of decreased lactic, decreased BP, and change of lung sounds to L side.

## 2017-07-30 NOTE — ED Provider Notes (Addendum)
[facility] EMERGENCY DEPARTMENT Provider Note   CSN: [id number] Arrival date & time: 07/30/17  1650     History   Chief Complaint Chief Complaint  Patient presents with  . Fever  . Weakness    HPI [name] is a 57 y.o. male.  Complaint fever, confusion, weakness  HPI: 57 year old male.  History of morbid obesity, insulin-dependent diabetes, CKD, a flutter, GERD, hypertension, multiple myeloma, MI, previous Fournier's gangrene scrotum requiring I&D 04/2017.  Presents today accompanied by his wife.  Had symptoms 2 nights ago it was feverish and confused.  Malaise for the day yesterday.  Diarrhea.  Cough.  Fever and weakness at home today.  Temperature up to 100.1.  103 upon arrival here.  Did receive flu vaccine.  Nonproductive cough.  Generalized malaise.  Diarrhea without blood.  Diffuse abdominal pain.  No scrotal pain.  No dysuria.  Has urinated today.  Past Medical History:  Diagnosis Date  . Anemia   . Arteriosclerotic cardiovascular disease (ASCVD)    MI-2000s; stent to the proximal LAD and diagonal in 2001; stress nuclear in 2008-impaired exercise capacity, left ventricular dilatation, moderately to severely depressed EF, apical, inferior and anteroseptal scar  . Arthritis   . Atrial flutter ([place])   . 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 Cellulitis of leg    both legs  . Chronic diarrhea   . Chronic kidney disease, stage 3, mod decreased GFR (HCC)    Creatinine of 1.84 in 06/2011 and 1.5 in 07/2011  . Diabetes mellitus    Insulin  . Dysrhythmia    AFlutter  . GERD (gastroesophageal reflux disease)   . Gout   . Hyperlipidemia   . Hypertension   . Injection site reaction   . Multiple myeloma 07/01/2011  . Myocardial infarction ([place]) 2000  . Obesity   . Pedal edema    Venous insufficiency  . Sleep apnea    uses cpap  . Ulcer     Patient Active Problem List   Diagnosis Date Noted  . Fournier gangrene 04/14/2017  . Epididymitis 04/13/2017  .  Acute epididymitis 04/13/2017  . Pneumonia 06/10/2016  . CAP (community acquired pneumonia) 06/10/2016  . Cellulitis of left lower extremity   . Hypogammaglobulinemia ([place]) 03/09/2016  . Hypokalemia 03/09/2016  . Diaphragmatic hernia without obstruction 02/26/2016  . Chronic systolic heart failure ([place]) 12/19/2013  . Anemia, normocytic normochromic 10/09/2012  . Chronic pancreatitis ([place]) 10/07/2012  . Atrial flutter ([place]) 09/17/2012  . DDD (degenerative disc disease), cervical 03/18/2012  . Arteriosclerotic cardiovascular disease (ASCVD)   . Hypertension   . Hyperlipidemia   . Multiple myeloma ([place]) 07/01/2011  . Morbid obesity ([place]) 04/29/2010  . Insulin dependent diabetes mellitus ([place]) 11/15/2008  . Sleep apnea 11/15/2008    Past Surgical History:  Procedure Laterality Date  . ABSCESS DRAINAGE     Scrotal  . BIOPSY  01/02/2012   Procedure: BIOPSY;  Surgeon: Rogene Houston, MD;  Location: [facility] ENDO SUITE;  Service: Endoscopy;  Laterality: N/A;  . BONE MARROW BIOPSY  05/13/11  . CARDIAC CATHETERIZATION     cardiac stent  . CARDIOVERSION N/A 10/13/2012   Procedure: CARDIOVERSION;  Surgeon: Yehuda Savannah, MD;  Location: [facility] ORS;  Service: Cardiovascular;  Laterality: N/A;  . CATARACT EXTRACTION W/PHACO Left 02/13/2014   Procedure: CATARACT EXTRACTION PHACO AND INTRAOCULAR LENS PLACEMENT ([place]);  Surgeon: Tonny Branch, MD;  Location: [facility] ORS;  Service: Ophthalmology;  Laterality: Left;  CDE:  7.67  . CATARACT EXTRACTION W/PHACO  Right 03/02/2014   Procedure: CATARACT EXTRACTION PHACO AND INTRAOCULAR LENS PLACEMENT RIGHT EYE CDE=16.81;  Surgeon: Tonny Branch, MD;  Location: [facility] ORS;  Service: Ophthalmology;  Laterality: Right;  . COLONOSCOPY  11/28/2011   Procedure: COLONOSCOPY;  Surgeon: Rogene Houston, MD;  Location: [facility] END[facility] SU[facility];  Service: Endoscopy;  Laterality: N/A;  930  . ESOPHAGOGASTRODUODENOSCOPY  01/02/2012   Procedure: ESOPHAGOGASTRODUODENOSCOPY (EGD);  Surgeon: Rogene Houston,  MD;  Location: [facility];  Service: Endoscopy;  Laterality: N/A;  100  . ESOPHAGOGASTRODUODENOSCOPY N/A 09/20/2012   Procedure: ESOPHAGOGASTRODUODENOSCOPY (EGD);  Surgeon: Rogene Houston, MD;  Location: [facility];  Service: Endoscopy;  Laterality: N/A;  . EUS N/A 10/07/2012   Procedure: UPPER ENDOSCOPIC ULTRASOUND (EUS) LINEAR;  Surgeon: Milus Banister, MD;  Location: [facility] ENDOSCOPY;  Service: Endoscopy;  Laterality: N/A;  . INCISION AND DRAINAGE ABSCESS N/A 04/14/2017   Procedure: INCISION AND DRAINAGE ABSCESS;  Surgeon: Ceasar Mons, MD;  Location: [facility] ORS;  Service: Urology;  Laterality: N/A;  . INCISION AND DRAINAGE ABSCESS ANAL    . IRRIGATION AND DEBRIDEMENT ABSCESS N/A 04/15/2017   Procedure: DEBRIDEMENT SCROTAL WOUND AND DRESSING CHANGE;  Surgeon: Ceasar Mons, MD;  Location: [facility];  Service: Urology;  Laterality: N/A;  . IRRIGATION AND DEBRIDEMENT ABSCESS N/A 04/17/2017   Procedure: IRRIGATION AND DEBRIDEMENT ABSCESS;  Surgeon: Ceasar Mons, MD;  Location: [facility];  Service: Urology;  Laterality: N/A;  RM 3  . LAPAROSCOPIC GASTRIC BANDING  2006   has been removed  . OSTECTOMY Right 04/08/2017   Procedure: OSTECTOMY RIGHT GREAT TOE;  Surgeon: Caprice Beaver, DPM;  Location: [facility] ORS;  Service: Podiatry;  Laterality: Right;  . PORT-A-CATH REMOVAL Left 12/07/2012   Procedure: REMOVAL PORT-A-CATH;  Surgeon: Scherry Ran, MD;  Location: [facility] ORS;  Service: General;  Laterality: Left;  . PORTACATH PLACEMENT  07/07/2011   Procedure: INSERTION PORT-A-CATH;  Surgeon: Scherry Ran, MD;  Location: [facility] OR[facility];  Service: General;  Laterality: N/A;  . PORTACATH PLACEMENT N/A 12/07/2012   Procedure: INSERTION PORT-A-CATH;  Surgeon: Scherry Ran, MD;  Location: [facility] ORS;  Service: General;  Laterality: N/A;  Attempted portacath placement on left and right side  . WOUND DEBRIDEMENT Right 04/08/2017   Procedure: EXCISION ULCERATION RIGHT GREAT TOE;   Surgeon: Caprice Beaver, DPM;  Location: [facility] ORS;  Service: Podiatry;  Laterality: Right;  . WRIST SURGERY     Left; removal of bone fragment       Home Medications    Prior to Admission medications   Medication Sig Start Date End Date Taking? Authorizing Provider  acyclovir (ZOVIRAX) 400 MG tablet Take 1 tablet (400 mg total) by mouth every morning. 02/18/17  Yes Twana First, MD  allopurinol (ZYLOPRIM) 300 MG tablet TAKE ONE TABLET BY MOUTH ONCE DAILY. 05/11/17  Yes Holley Bouche, NP  Artificial Tear Ointment (DRY EYES OP) Place 1 drop into both eyes as needed (dry eyes).   Yes [provider]  atorvastatin (LIPITOR) 80 MG tablet TAKE ONE TABLET BY MOUTH AT BEDTIME. 12/18/16  Yes Herminio Commons, MD  calcitRIOL (ROCALTROL) 0.25 MCG capsule Take 0.25 mcg by mouth 3 (three) times a week. Monday, Wednesday, Friday. 12/14/13  Yes [provider]  Calcium Carbonate (CALCIUM 600 PO) Take 1 tablet by mouth 2 (two) times daily.   Yes [provider]  Calcium Carbonate-Vit D-Min (CALCIUM 1200 PO) Take 1 tablet by mouth 2 (two) times daily.   Yes [provider]  dexlansoprazole (DEXILANT) 60 MG capsule Take 60 mg by mouth every morning.   Yes [provider]  dicyclomine (BENTYL) 20 MG tablet TAKE 1 TABLET BY MOUTH BEFORE MEALS 3 TIMES DAILY. 05/18/17  Yes Rehman, Mechele Dawley, MD  Eluxadoline (VIBERZI) 100 MG TABS Take 100 mg 2 (two) times daily with a meal by mouth.    Yes [provider]  gabapentin (NEURONTIN) 300 MG capsule Take 1 capsule (300 mg total) by mouth 2 (two) times daily. 04/22/17  Yes Short, Noah Delaine, MD  insulin detemir (LEVEMIR) 100 UNIT/ML injection Inject 0.1 mLs (10 Units total) into the skin every morning. Patient taking differently: Inject 40 Units into the skin every morning.  04/22/17  Yes Short, Noah Delaine, MD  insulin lispro (HUMALOG) 100 UNIT/ML injection Inject 0-0.1 mLs (0-10 Units total) into the skin 2  (two) times daily with a meal. Sliding Scale per patient  CBG 150-200: 1 units CBG 201-275:  3 units CBG 275-350: 5 units 351-425: 7 units 425-500: 9 units 500+: 10 units 04/22/17  Yes Short, Mackenzie, MD  lisinopril (PRINIVIL,ZESTRIL) 5 MG tablet Take 5 mg by mouth every other day.  07/14/17  Yes [provider]  loratadine (CLARITIN) 10 MG tablet Take 10 mg by mouth every morning.    Yes [provider]  Magnesium Cl-Calcium Carbonate (SLOW-MAG PO) Take 1 tablet by mouth every morning.   Yes [provider]  metoprolol succinate (TOPROL-XL) 25 MG 24 hr tablet TAKE (1) TABLET BY MOUTH ONCE DAILY. 07/06/17  Yes Herminio Commons, MD  Multiple Vitamins-Minerals (MULTIVITAMINS THER. W/MINERALS) TABS Take 1 tablet by mouth daily.    Yes [provider]  nitroGLYCERIN (NITROSTAT) 0.4 MG SL tablet DISSOLVE 1 TABLET UNDER TONGUE EVERY 5 MINUTES UP TO 15 MIN FOR CHESTPAIN. IF NO RELIEF CALL 911. 05/11/17  Yes Holley Bouche, NP  omeprazole (PRILOSEC) 40 MG capsule Take 1 capsule (40 mg total) by mouth daily. 02/17/17  Yes Twana First, MD  potassium chloride SA (K-DUR,KLOR-CON) 20 MEQ tablet Take 2 tablets (40 mEq total) by mouth 2 (two) times daily. 02/27/17  Yes Twana First, MD  sulfamethoxazole-trimethoprim (BACTRIM DS,SEPTRA DS) 800-160 MG tablet TAKE ONE TABLET BY MOUTH EVERY MONDAY, WEDNESDAY, AND FRIDAY. 06/12/17  Yes Holley Bouche, NP  torsemide (DEMADEX) 20 MG tablet TAKE 2 TABLETS BY MOUTH EACH MORNING AND 1 TABLET EACH EVENING. Patient taking differently: TAKE 2 TABLETS BY MOUTH EACH MORNING AND 1 TABLET EACH EVENING. *MAY TAKE ADDITIONAL TABLET IN THE EVENING IF NEEDED FOR FLUID RETENTION 04/22/16  Yes Setzer, Terri L, NP  Vitamin D, Ergocalciferol, (DRISDOL) 50000 units CAPS capsule TAKE 1 CAPSULE BY MOUTH EVERY THIRTY DAYS. 05/11/17  Yes Holley Bouche, NP  XARELTO 20 MG TABS tablet TAKE 1 TABLET BY MOUTH DAILY. Patient taking differently:  TAKE 1 TABLET BY MOUTH EVERY DAY IN THE EVENING 09/02/16  Yes Herminio Commons, MD  colchicine 0.6 MG tablet Take 1 tablet (0.6 mg total) by mouth 2 (two) times daily. Patient not taking: Reported on 07/30/2017 12/05/16   Tanna Furry, MD  dexamethasone (DECADRON) 4 MG tablet TAKE 5 TABLETS TWICE A DAY EVERY FRIDAY. Patient not taking: Reported on 07/16/2017 03/24/17   Holley Bouche, NP  lenalidomide ([name]) 5 MG capsule Take 1 capsule by mouth daily for 7 days, followed by 7 days off. Patient not taking: Reported on 07/16/2017 06/23/17   Jacquelin Hawking, NP  sulfamethoxazole-trimethoprim (BACTRIM DS,SEPTRA DS) 800-160 MG tablet  TAKE ONE TABLET BY MOUTH EVERY MONDAY, [place], AND FRIDAY. Patient not taking: Reported on 07/30/2017 07/17/17   Holley Bouche, NP    Family History Family History  Problem Relation Age of Onset  . Heart disease Mother   . Cancer Mother   . Diabetes Father   . Arthritis Unknown   . Anesthesia problems Neg Hx   . Hypotension Neg Hx   . Malignant hyperthermia Neg Hx   . Pseudochol deficiency Neg Hx     Social History Social History   Tobacco Use  . Smoking status: Former Smoker    Packs/day: 0.25    Years: 1.00    Pack years: 0.25    Types: Cigarettes, Cigars    Last attempt to quit: 05/17/2001    Years since quitting: 16.2  . Smokeless tobacco: Never Used  Substance Use Topics  . Alcohol use: No    Alcohol/week: 0.0 oz  . Drug use: No     Allergies   Patient has no known allergies.   Review of Systems Review of Systems  Constitutional: Positive for fatigue and fever. Negative for appetite change, chills and diaphoresis.  HENT: Negative for mouth sores, sore throat and trouble swallowing.   Eyes: Negative for visual disturbance.  Respiratory: Positive for cough. Negative for chest tightness, shortness of breath and wheezing.   Cardiovascular: Negative for chest pain.  Gastrointestinal: Positive for abdominal pain, diarrhea and  nausea. Negative for abdominal distention and vomiting.  Endocrine: Negative for polydipsia, polyphagia and polyuria.  Genitourinary: Negative for dysuria, frequency and hematuria.  Musculoskeletal: Negative for gait problem.  Skin: Negative for color change, pallor and rash.  Neurological: Positive for weakness. Negative for dizziness, syncope, light-headedness and headaches.  Hematological: Does not bruise/bleed easily.  Psychiatric/Behavioral: Negative for behavioral problems and confusion.     Physical Exam Updated Vital Signs BP 105/81   Pulse 99   Temp (!) 100.6 F (38.1 C) (Oral)   Resp (!) 35   Ht '6\' 2"'  (1.88 m)   Wt (!) 172.4 kg (380 lb)   SpO2 96%   BMI 48.79 kg/m   Physical Exam  Constitutional: He is oriented to person, place, and time. No distress.  58 year old male.  Morbid obesity.  Awake and alert.  Able to answer questioning appropriately.  No distress  HENT:  Head: Normocephalic.  His membranes are not dried  Eyes: Conjunctivae are normal. Pupils are equal, round, and reactive to light. No scleral icterus.  No scleral icterus.  No conjunctival injection.  Neck: Normal range of motion. Neck supple. No thyromegaly present.  Cardiovascular: Normal rate and regular rhythm. Exam reveals no gallop and no friction rub.  No murmur heard. Sinus.  99 on the monitor.  Pulmonary/Chest: Effort normal and breath sounds normal. No respiratory distress. He has no wheezes. He has no rales.  No tachypnea.  No increased work of breathing.  Abdominal: Soft. Bowel sounds are normal. He exhibits no distension. There is no tenderness. There is no rebound.  Complains of mild diffuse abdominal discomfort.  No localizing tenderness.  No scrotal abnormalities.  Normal appearance of the scrotal skin at his area of previous Fournier's.  Musculoskeletal: Normal range of motion.  Neurological: He is alert and oriented to person, place, and time.  Skin: Skin is warm and dry. No rash  noted.  Psychiatric: He has a normal mood and affect. His behavior is normal.     ED Treatments / Results  Labs (all labs ordered are  listed, but only abnormal results are displayed) Labs Reviewed  COMPREHENSIVE METABOLIC PANEL - Abnormal; Notable for the following components:      Result Value   Sodium 132 (*)    Potassium 5.8 (*)    CO2 17 (*)    BUN 52 (*)    Creatinine, Ser 3.81 (*)    Calcium 8.0 (*)    Total Protein 5.8 (*)    Albumin 2.7 (*)    AST 62 (*)    GFR calc non Af Amer 16 (*)    GFR calc Af Amer 19 (*)    All other components within normal limits  CBC WITH DIFFERENTIAL/PLATELET - Abnormal; Notable for the following components:   RBC 3.06 (*)    Hemoglobin 9.4 (*)    HCT 29.6 (*)    RDW 16.9 (*)    Platelets 133 (*)    All other components within normal limits  LACTIC ACID, PLASMA - Abnormal; Notable for the following components:   Lactic Acid, Venous 3.1 (*)    All other components within normal limits  I-STAT CG4 LACTIC ACID, ED - Abnormal; Notable for the following components:   Lactic Acid, Venous 3.47 (*)    All other components within normal limits  CULTURE, BLOOD (ROUTINE X 2)  CULTURE, BLOOD (ROUTINE X 2)  URINE CULTURE  INFLUENZA PANEL BY PCR (TYPE A & B)  URINALYSIS, ROUTINE W REFLEX MICROSCOPIC  I-STAT CG4 LACTIC ACID, ED    EKG  EKG Interpretation None       Radiology Dg Chest Port 1 View  Result Date: 07/30/2017 CLINICAL DATA:  Febrile. Shoulder pain, weakness and fatigue. History of multiple myeloma. EXAM: PORTABLE CHEST 1 VIEW COMPARISON:  Chest radiograph November 24, 2016 and CT abdomen and pelvis February 25, 2015 FINDINGS: Cardiac silhouette is moderately enlarged. Pulmonary vascular congestion. Rounded density projects in RIGHT lung base, with focal air density. No pneumothorax. Single lumen RIGHT chest Port-A-Cath distal tip projects in mid superior vena cava. Faint calcifications RIGHT neck are likely vascular. Loose body RIGHT  shoulder. IMPRESSION: LEFT diaphragmatic hernia containing bowel. This could obscure LEFT lung base pneumonia or atelectasis. Moderate cardiomegaly and pulmonary vascular congestion. Electronically Signed   By: Elon Alas M.D.   On: 07/30/2017 18:22    Procedures Procedures (including critical care time)  Medications Ordered in ED Medications  sodium chloride 0.9 % bolus 1,000 mL (1,000 mLs Intravenous New Bag/Given 07/30/17 1817)    And  sodium chloride 0.9 % bolus 1,000 mL (not administered)    And  sodium chloride 0.9 % bolus 1,000 mL (not administered)    And  sodium chloride 0.9 % bolus 1,000 mL (not administered)    And  sodium chloride 0.9 % bolus 500 mL (not administered)  azithromycin (ZITHROMAX) 500 mg in sodium chloride 0.9 % 250 mL IVPB (500 mg Intravenous New Bag/Given 07/30/17 1915)  sodium chloride 0.9 % bolus 1,000 mL (1,000 mLs Intravenous New Bag/Given 07/30/17 1756)  0.9 %  sodium chloride infusion ( Intravenous New Bag/Given 07/30/17 1756)  acetaminophen (TYLENOL) tablet 1,000 mg (1,000 mg Oral Given 07/30/17 1755)  cefTRIAXone (ROCEPHIN) 2 g in sodium chloride 0.9 % 100 mL IVPB (0 g Intravenous Stopped 07/30/17 1913)     Initial Impression / Assessment and Plan / ED Course  I have reviewed the triage vital signs and the nursing notes.  Pertinent labs & imaging results that were available during my care of the patient were reviewed by me and  considered in my medical decision making (see chart for details).    Acute febrile illness.  Elevated lactate.  Sepsis protocol initiated.  No obvious source on exam.  Chest x-ray shows left diaphragmatic hernia which may obscure left lower lobe infiltrate.  CT scan of chest, and abdomen obtained/pending.  Has been given cultures and antibiotics.  Await imaging.  Will require admission.  Getting sepsis fluids.  Had p.o. Tylenol.  22:13: Blood pressure improved.  Receiving fourth liter.  Urine appears to be his source.  Per my  view no obvious pneumonia on chest x-ray.  Has eventration/diaphragmatic hernia but no obvious infiltrates.  Awaiting radiologist interpretation of CT abdomen.  Blood pressure now 139/89.  Still tachypneic at 30.  Awake, alert, mentating well.  Heart rate 90.  Has had coverage with IV antibiotics with initially Rocephin and Zithromax consideration for possible pneumonia.  Rocephin should be adequate renal coverage.  Await CT scan interpretation.  Will require admission.   EKG Interpretation  Sinus rhythm. No injury.  CRITICAL CARE Performed by: Lolita Patella   Total critical care time: 89 minutes including multiple re-evaluations to rule out signs of progressive CHF as patient was receiving large bolus IV fluids for sepsis protocol.  Critical care time was exclusive of separately billable procedures and treating other patients.  Critical care was necessary to treat or prevent imminent or life-threatening deterioration.  Critical care was time spent personally by me on the following activities: development of treatment plan with patient and/or surrogate as well as nursing, discussions with consultants, evaluation of patient's response to treatment, examination of patient, obtaining history from patient or surrogate, ordering and performing treatments and interventions, ordering and review of laboratory studies, ordering and review of radiographic studies, pulse oximetry and re-evaluation of patient's condition.  Final Clinical Impressions(s) / ED Diagnoses   Final diagnoses:  Weakness    ED Discharge Orders    None       Tanna Furry, MD 08/01/17 1455    Tanna Furry, MD 08/01/17 [phone]

## 2017-07-30 NOTE — ED Triage Notes (Signed)
Per EMS patient from home. Patient complains of fever. EMS states 99.9. Pt states fever was 100.1 at home. Patient also complains of bilateral shoulder pain, weakness and fatigue.

## 2017-07-30 NOTE — ED Notes (Signed)
Pt states he does not need to urinate at this time, aware of DO  

## 2017-07-31 ENCOUNTER — Encounter (HOSPITAL_COMMUNITY): Payer: Self-pay | Admitting: Emergency Medicine

## 2017-07-31 ENCOUNTER — Other Ambulatory Visit: Payer: Self-pay

## 2017-07-31 ENCOUNTER — Inpatient Hospital Stay (HOSPITAL_COMMUNITY): Payer: BLUE CROSS/BLUE SHIELD | Admitting: Anesthesiology

## 2017-07-31 ENCOUNTER — Inpatient Hospital Stay (HOSPITAL_COMMUNITY): Payer: BLUE CROSS/BLUE SHIELD

## 2017-07-31 ENCOUNTER — Encounter (HOSPITAL_COMMUNITY)
Admission: EM | Disposition: A | Discharge status: Home Health | Payer: Self-pay | Source: Home / Self Care | Attending: Internal Medicine

## 2017-07-31 ENCOUNTER — Other Ambulatory Visit: Payer: Self-pay | Admitting: Urology

## 2017-07-31 DIAGNOSIS — A419 Sepsis, unspecified organism: Secondary | ICD-10-CM | POA: Diagnosis not present

## 2017-07-31 DIAGNOSIS — N179 Acute kidney failure, unspecified: Secondary | ICD-10-CM

## 2017-07-31 DIAGNOSIS — E1122 Type 2 diabetes mellitus with diabetic chronic kidney disease: Secondary | ICD-10-CM | POA: Diagnosis present

## 2017-07-31 DIAGNOSIS — N136 Pyonephrosis: Secondary | ICD-10-CM | POA: Diagnosis present

## 2017-07-31 DIAGNOSIS — N183 Chronic kidney disease, stage 3 (moderate): Secondary | ICD-10-CM | POA: Diagnosis present

## 2017-07-31 DIAGNOSIS — K219 Gastro-esophageal reflux disease without esophagitis: Secondary | ICD-10-CM | POA: Diagnosis present

## 2017-07-31 DIAGNOSIS — E785 Hyperlipidemia, unspecified: Secondary | ICD-10-CM | POA: Diagnosis present

## 2017-07-31 DIAGNOSIS — A4159 Other Gram-negative sepsis: Secondary | ICD-10-CM | POA: Diagnosis present

## 2017-07-31 DIAGNOSIS — R531 Weakness: Secondary | ICD-10-CM | POA: Diagnosis present

## 2017-07-31 DIAGNOSIS — I2510 Atherosclerotic heart disease of native coronary artery without angina pectoris: Secondary | ICD-10-CM | POA: Diagnosis present

## 2017-07-31 DIAGNOSIS — I4892 Unspecified atrial flutter: Secondary | ICD-10-CM | POA: Diagnosis present

## 2017-07-31 DIAGNOSIS — C9000 Multiple myeloma not having achieved remission: Secondary | ICD-10-CM | POA: Diagnosis present

## 2017-07-31 DIAGNOSIS — M109 Gout, unspecified: Secondary | ICD-10-CM | POA: Diagnosis present

## 2017-07-31 DIAGNOSIS — E2749 Other adrenocortical insufficiency: Secondary | ICD-10-CM | POA: Diagnosis present

## 2017-07-31 DIAGNOSIS — Z961 Presence of intraocular lens: Secondary | ICD-10-CM | POA: Diagnosis present

## 2017-07-31 DIAGNOSIS — Z9841 Cataract extraction status, right eye: Secondary | ICD-10-CM | POA: Diagnosis not present

## 2017-07-31 DIAGNOSIS — Z955 Presence of coronary angioplasty implant and graft: Secondary | ICD-10-CM | POA: Diagnosis not present

## 2017-07-31 DIAGNOSIS — K449 Diaphragmatic hernia without obstruction or gangrene: Secondary | ICD-10-CM | POA: Diagnosis present

## 2017-07-31 DIAGNOSIS — N390 Urinary tract infection, site not specified: Secondary | ICD-10-CM | POA: Diagnosis not present

## 2017-07-31 DIAGNOSIS — D631 Anemia in chronic kidney disease: Secondary | ICD-10-CM | POA: Diagnosis present

## 2017-07-31 DIAGNOSIS — G9341 Metabolic encephalopathy: Secondary | ICD-10-CM | POA: Diagnosis present

## 2017-07-31 DIAGNOSIS — I252 Old myocardial infarction: Secondary | ICD-10-CM | POA: Diagnosis not present

## 2017-07-31 DIAGNOSIS — D6489 Other specified anemias: Secondary | ICD-10-CM | POA: Diagnosis present

## 2017-07-31 DIAGNOSIS — I5022 Chronic systolic (congestive) heart failure: Secondary | ICD-10-CM | POA: Diagnosis present

## 2017-07-31 DIAGNOSIS — Z6841 Body Mass Index (BMI) 40.0 and over, adult: Secondary | ICD-10-CM | POA: Diagnosis not present

## 2017-07-31 DIAGNOSIS — I130 Hypertensive heart and chronic kidney disease with heart failure and stage 1 through stage 4 chronic kidney disease, or unspecified chronic kidney disease: Secondary | ICD-10-CM | POA: Diagnosis present

## 2017-07-31 HISTORY — PX: CYSTOSCOPY W/ URETERAL STENT PLACEMENT: SHX1429

## 2017-07-31 LAB — GLUCOSE, CAPILLARY
Glucose-Capillary: 99 mg/dL (ref 65–99)
Glucose-Capillary: 114 mg/dL — ABNORMAL HIGH (ref 65–99)
Glucose-Capillary: 134 mg/dL — ABNORMAL HIGH (ref 65–99)
Glucose-Capillary: 139 mg/dL — ABNORMAL HIGH (ref 65–99)
Glucose-Capillary: 141 mg/dL — ABNORMAL HIGH (ref 65–99)
Glucose-Capillary: 148 mg/dL — ABNORMAL HIGH (ref 65–99)
Glucose-Capillary: 153 mg/dL — ABNORMAL HIGH (ref 65–99)

## 2017-07-31 LAB — PROCALCITONIN: Procalcitonin: >150 ng/mL

## 2017-07-31 LAB — CBC
HCT: 26.3 % — ABNORMAL LOW (ref 39.0–52.0)
Hemoglobin: 8.5 g/dL — ABNORMAL LOW (ref 13.0–17.0)
MCH: 30.9 pg (ref 26.0–34.0)
MCHC: 32.3 g/dL (ref 30.0–36.0)
MCV: 95.6 fL (ref 78.0–100.0)
Platelets: 129 10*3/uL — ABNORMAL LOW (ref 150–400)
RBC: 2.75 MIL/uL — ABNORMAL LOW (ref 4.22–5.81)
RDW: 16.7 % — ABNORMAL HIGH (ref 11.5–15.5)
WBC: 8.6 10*3/uL (ref 4.0–10.5)

## 2017-07-31 LAB — BLOOD CULTURE ID PANEL (REFLEXED)

## 2017-07-31 LAB — LACTIC ACID, PLASMA
Lactic Acid, Venous: 1.1 mmol/L (ref 0.5–1.9)
Lactic Acid, Venous: 0.9 mmol/L (ref 0.5–1.9)

## 2017-07-31 LAB — COMPREHENSIVE METABOLIC PANEL
ALT: 33 U/L (ref 17–63)
AST: 107 U/L — ABNORMAL HIGH (ref 15–41)
Albumin: 2.5 g/dL — ABNORMAL LOW (ref 3.5–5.0)
Alkaline Phosphatase: 51 U/L (ref 38–126)
Anion gap: 10 (ref 5–15)
BUN: 55 mg/dL — ABNORMAL HIGH (ref 6–20)
CO2: 18 mmol/L — ABNORMAL LOW (ref 22–32)
Calcium: 7.5 mg/dL — ABNORMAL LOW (ref 8.9–10.3)
Chloride: 113 mmol/L — ABNORMAL HIGH (ref 101–111)
Creatinine, Ser: 3.29 mg/dL — ABNORMAL HIGH (ref 0.61–1.24)
GFR calc Af Amer: 22 mL/min — ABNORMAL LOW (ref >60)
GFR calc non Af Amer: 19 mL/min — ABNORMAL LOW (ref >60)
Glucose, Bld: 109 mg/dL — ABNORMAL HIGH (ref 65–99)
Potassium: 5 mmol/L (ref 3.5–5.1)
Sodium: 141 mmol/L (ref 135–145)
Total Bilirubin: 0.9 mg/dL (ref 0.3–1.2)
Total Protein: 5.3 g/dL — ABNORMAL LOW (ref 6.5–8.1)

## 2017-07-31 LAB — HEPARIN LEVEL (UNFRACTIONATED): Heparin Unfractionated: 1.46 IU/mL — ABNORMAL HIGH (ref 0.30–0.70)

## 2017-07-31 LAB — APTT
aPTT: 51 s — ABNORMAL HIGH (ref 24–36)
aPTT: 98 s — ABNORMAL HIGH (ref 24–36)
aPTT: 97 seconds — ABNORMAL HIGH (ref 24–36)

## 2017-07-31 LAB — MRSA PCR SCREENING: MRSA by PCR: POSITIVE — AB

## 2017-07-31 SURGERY — CYSTOSCOPY, WITH RETROGRADE PYELOGRAM AND URETERAL STENT INSERTION
Anesthesia: General | Site: Ureter | Laterality: Right

## 2017-07-31 MED ORDER — INSULIN DETEMIR 100 UNIT/ML ~~LOC~~ SOLN
20.0000 [IU] | Freq: Every morning | SUBCUTANEOUS | Status: DC
  Administered 2017-07-31: 20 [IU] via SUBCUTANEOUS
  Filled 2017-07-31 (×2): qty 0.2

## 2017-07-31 MED ORDER — SUCCINYLCHOLINE CHLORIDE 200 MG/10ML IV SOSY
PREFILLED_SYRINGE | INTRAVENOUS | Status: AC
Start: 2017-07-31 — End: 2017-07-31
  Filled 2017-07-31: qty 10

## 2017-07-31 MED ORDER — LIDOCAINE 2% (20 MG/ML) 5 ML SYRINGE
INTRAMUSCULAR | Status: AC
  Filled 2017-07-31: qty 5

## 2017-07-31 MED ORDER — FENTANYL CITRATE (PF) 100 MCG/2ML IJ SOLN
INTRAMUSCULAR | Status: AC
  Filled 2017-07-31: qty 2

## 2017-07-31 MED ORDER — INSULIN ASPART 100 UNIT/ML ~~LOC~~ SOLN
0.0000 [IU] | SUBCUTANEOUS | Status: DC
  Administered 2017-07-31 (×2): 3 [IU] via SUBCUTANEOUS
  Administered 2017-08-01: 4 [IU] via SUBCUTANEOUS
  Administered 2017-08-01: 3 [IU] via SUBCUTANEOUS
  Administered 2017-08-01 (×2): 4 [IU] via SUBCUTANEOUS
  Administered 2017-08-01: 3 [IU] via SUBCUTANEOUS
  Administered 2017-08-01 – 2017-08-02 (×2): 7 [IU] via SUBCUTANEOUS
  Administered 2017-08-02: 4 [IU] via SUBCUTANEOUS
  Administered 2017-08-02: 7 [IU] via SUBCUTANEOUS
  Administered 2017-08-02 – 2017-08-03 (×5): 4 [IU] via SUBCUTANEOUS
  Administered 2017-08-03: 3 [IU] via SUBCUTANEOUS
  Administered 2017-08-03: 4 [IU] via SUBCUTANEOUS
  Administered 2017-08-03: 3 [IU] via SUBCUTANEOUS
  Administered 2017-08-03 – 2017-08-04 (×2): 4 [IU] via SUBCUTANEOUS
  Administered 2017-08-04 (×3): 3 [IU] via SUBCUTANEOUS

## 2017-07-31 MED ORDER — PROPOFOL 10 MG/ML IV BOLUS
INTRAVENOUS | Status: AC
  Filled 2017-07-31: qty 20

## 2017-07-31 MED ORDER — GABAPENTIN 300 MG PO CAPS
300.0000 mg | ORAL_CAPSULE | Freq: Two times a day (BID) | ORAL | Status: DC
  Administered 2017-07-31 – 2017-08-04 (×9): 300 mg via ORAL
  Filled 2017-07-31 (×9): qty 1

## 2017-07-31 MED ORDER — SODIUM CHLORIDE 0.9 % IR SOLN
Status: DC | PRN
  Administered 2017-07-31: 3000 mL

## 2017-07-31 MED ORDER — MIDAZOLAM HCL 2 MG/2ML IJ SOLN
INTRAMUSCULAR | Status: AC
  Filled 2017-07-31: qty 2

## 2017-07-31 MED ORDER — HYDROCORTISONE NA SUCCINATE PF 100 MG IJ SOLR
100.0000 mg | Freq: Three times a day (TID) | INTRAMUSCULAR | Status: DC
  Administered 2017-07-31 (×2): 100 mg via INTRAVENOUS
  Filled 2017-07-31 (×2): qty 2

## 2017-07-31 MED ORDER — INSULIN DETEMIR 100 UNIT/ML ~~LOC~~ SOLN
10.0000 [IU] | Freq: Every morning | SUBCUTANEOUS | Status: DC
Start: 2017-08-01 — End: 2017-08-04
  Administered 2017-08-01 – 2017-08-04 (×4): 10 [IU] via SUBCUTANEOUS
  Filled 2017-07-31 (×4): qty 0.1

## 2017-07-31 MED ORDER — SULFAMETHOXAZOLE-TRIMETHOPRIM 800-160 MG PO TABS: 1.0000 | ORAL_TABLET | ORAL | Status: DC

## 2017-07-31 MED ORDER — SODIUM CHLORIDE 0.9 % IV SOLN
INTRAVENOUS | Status: DC
  Administered 2017-07-31 – 2017-08-01 (×2): via INTRAVENOUS

## 2017-07-31 MED ORDER — LIDOCAINE HCL (CARDIAC) 20 MG/ML IV SOLN
INTRAVENOUS | Status: DC | PRN
  Administered 2017-07-31: 100 mg via INTRAVENOUS

## 2017-07-31 MED ORDER — PANTOPRAZOLE SODIUM 40 MG PO TBEC
40.0000 mg | DELAYED_RELEASE_TABLET | Freq: Every day | ORAL | Status: DC
  Administered 2017-08-01 – 2017-08-04 (×4): 40 mg via ORAL
  Filled 2017-07-31 (×4): qty 1

## 2017-07-31 MED ORDER — VANCOMYCIN HCL 10 G IV SOLR
2000.0000 mg | Freq: Once | INTRAVENOUS | Status: AC
  Administered 2017-07-31: 2000 mg via INTRAVENOUS
  Filled 2017-07-31: qty 2000

## 2017-07-31 MED ORDER — METOPROLOL SUCCINATE ER 25 MG PO TB24
25.0000 mg | ORAL_TABLET | Freq: Every day | ORAL | Status: DC
  Administered 2017-08-01 – 2017-08-03 (×3): 25 mg via ORAL
  Filled 2017-07-31 (×5): qty 1

## 2017-07-31 MED ORDER — ADULT MULTIVITAMIN W/MINERALS CH
1.0000 | ORAL_TABLET | Freq: Every day | ORAL | Status: DC
  Administered 2017-08-01 – 2017-08-04 (×4): 1 via ORAL
  Filled 2017-07-31 (×5): qty 1

## 2017-07-31 MED ORDER — SODIUM CHLORIDE 0.9 % IV SOLN
2.0000 g | INTRAVENOUS | Status: DC
  Administered 2017-07-31 – 2017-08-03 (×4): 2 g via INTRAVENOUS
  Filled 2017-07-31 (×4): qty 2

## 2017-07-31 MED ORDER — PROPOFOL 10 MG/ML IV BOLUS
INTRAVENOUS | Status: DC | PRN
  Administered 2017-07-31: 160 mg via INTRAVENOUS

## 2017-07-31 MED ORDER — VANCOMYCIN HCL 10 G IV SOLR: 2000.0000 mg | INTRAVENOUS | Status: DC

## 2017-07-31 MED ORDER — ONDANSETRON HCL 4 MG/2ML IJ SOLN
INTRAMUSCULAR | Status: AC
  Filled 2017-07-31: qty 2

## 2017-07-31 MED ORDER — ELUXADOLINE 100 MG PO TABS
100.0000 mg | ORAL_TABLET | Freq: Two times a day (BID) | ORAL | Status: DC
  Administered 2017-08-01 – 2017-08-04 (×7): 100 mg via ORAL

## 2017-07-31 MED ORDER — CALCIUM CARBONATE 1250 (500 CA) MG PO TABS
3.0000 | ORAL_TABLET | Freq: Two times a day (BID) | ORAL | Status: DC
  Administered 2017-08-01 – 2017-08-04 (×7): 1500 mg via ORAL
  Filled 2017-07-31 (×7): qty 2

## 2017-07-31 MED ORDER — ONDANSETRON HCL 4 MG/2ML IJ SOLN
INTRAMUSCULAR | Status: DC | PRN
  Administered 2017-07-31: 4 mg via INTRAVENOUS

## 2017-07-31 MED ORDER — PIPERACILLIN-TAZOBACTAM 3.375 G IVPB: 3.3750 g | Freq: Once | INTRAVENOUS | Status: DC

## 2017-07-31 MED ORDER — OXYCODONE HCL 5 MG PO TABS: 5.0000 mg | ORAL_TABLET | Freq: Once | ORAL | Status: DC | PRN

## 2017-07-31 MED ORDER — DICYCLOMINE HCL 20 MG PO TABS
20.0000 mg | ORAL_TABLET | Freq: Three times a day (TID) | ORAL | Status: DC
  Administered 2017-07-31 – 2017-08-04 (×15): 20 mg via ORAL
  Filled 2017-07-31 (×20): qty 1

## 2017-07-31 MED ORDER — SODIUM POLYSTYRENE SULFONATE 15 GM/60ML PO SUSP
30.0000 g | Freq: Once | ORAL | Status: AC
  Administered 2017-07-31: 30 g via ORAL
  Filled 2017-07-31: qty 120

## 2017-07-31 MED ORDER — DEXAMETHASONE SODIUM PHOSPHATE 10 MG/ML IJ SOLN
INTRAMUSCULAR | Status: DC | PRN
  Administered 2017-07-31: 5 mg via INTRAVENOUS

## 2017-07-31 MED ORDER — SODIUM CHLORIDE 0.9 % IV SOLN
INTRAVENOUS | Status: DC | PRN
  Administered 2017-07-31: 16:00:00 via INTRAVENOUS

## 2017-07-31 MED ORDER — ACETAMINOPHEN 325 MG PO TABS: 650.0000 mg | ORAL_TABLET | Freq: Four times a day (QID) | ORAL | Status: DC | PRN

## 2017-07-31 MED ORDER — HEPARIN (PORCINE) IN NACL 100-0.45 UNIT/ML-% IJ SOLN
1900.0000 [IU]/h | INTRAMUSCULAR | Status: DC
  Administered 2017-07-31 – 2017-08-01 (×2): 1900 [IU]/h via INTRAVENOUS
  Filled 2017-07-31 (×4): qty 250

## 2017-07-31 MED ORDER — ACETAMINOPHEN 325 MG PO TABS: 325.0000 mg | ORAL_TABLET | ORAL | Status: DC | PRN

## 2017-07-31 MED ORDER — ALBUTEROL SULFATE (2.5 MG/3ML) 0.083% IN NEBU
INHALATION_SOLUTION | RESPIRATORY_TRACT | Status: AC
  Administered 2017-07-31: 2.5 mg via RESPIRATORY_TRACT
  Filled 2017-07-31: qty 3

## 2017-07-31 MED ORDER — ACETAMINOPHEN 160 MG/5ML PO SOLN: 325.0000 mg | ORAL | Status: DC | PRN

## 2017-07-31 MED ORDER — SODIUM CHLORIDE 0.9 % IV SOLN
1.0000 g | Freq: Once | INTRAVENOUS | Status: AC
  Administered 2017-07-31: 1 g via INTRAVENOUS
  Filled 2017-07-31: qty 10

## 2017-07-31 MED ORDER — SODIUM CHLORIDE 0.9 % IV SOLN
INTRAVENOUS | Status: DC
  Administered 2017-07-31 (×2): via INTRAVENOUS

## 2017-07-31 MED ORDER — IOHEXOL 300 MG/ML  SOLN
INTRAMUSCULAR | Status: DC | PRN
  Administered 2017-07-31: 16 mL via INTRAVENOUS

## 2017-07-31 MED ORDER — ONDANSETRON HCL 4 MG/2ML IJ SOLN
4.0000 mg | Freq: Four times a day (QID) | INTRAMUSCULAR | Status: DC | PRN
  Administered 2017-07-31: 4 mg via INTRAVENOUS
  Filled 2017-07-31: qty 2

## 2017-07-31 MED ORDER — PIPERACILLIN-TAZOBACTAM 3.375 G IVPB
3.3750 g | Freq: Three times a day (TID) | INTRAVENOUS | Status: DC
  Administered 2017-07-31: 3.375 g via INTRAVENOUS
  Filled 2017-07-31: qty 50

## 2017-07-31 MED ORDER — ACYCLOVIR 400 MG PO TABS
400.0000 mg | ORAL_TABLET | Freq: Every morning | ORAL | Status: DC
  Administered 2017-08-01 – 2017-08-04 (×4): 400 mg via ORAL
  Filled 2017-07-31 (×5): qty 1

## 2017-07-31 MED ORDER — DEXAMETHASONE SODIUM PHOSPHATE 10 MG/ML IJ SOLN
INTRAMUSCULAR | Status: AC
  Filled 2017-07-31: qty 1

## 2017-07-31 MED ORDER — CALCIUM CARBONATE 1500 (600 CA) MG PO TABS
600.0000 mg | ORAL_TABLET | Freq: Two times a day (BID) | ORAL | Status: DC
  Filled 2017-07-31 (×2): qty 1

## 2017-07-31 MED ORDER — ALBUTEROL SULFATE (2.5 MG/3ML) 0.083% IN NEBU
2.5000 mg | INHALATION_SOLUTION | Freq: Four times a day (QID) | RESPIRATORY_TRACT | Status: DC | PRN
  Administered 2017-07-31: 2.5 mg via RESPIRATORY_TRACT

## 2017-07-31 MED ORDER — LIP MEDEX EX OINT
TOPICAL_OINTMENT | CUTANEOUS | Status: AC
  Filled 2017-07-31: qty 7

## 2017-07-31 MED ORDER — CALCITRIOL 0.25 MCG PO CAPS
0.2500 ug | ORAL_CAPSULE | ORAL | Status: DC
  Administered 2017-08-03: 0.25 ug via ORAL
  Filled 2017-07-31 (×2): qty 1

## 2017-07-31 MED ORDER — CEFEPIME HCL 1 G IJ SOLR
1.0000 g | Freq: Two times a day (BID) | INTRAMUSCULAR | Status: DC
  Filled 2017-07-31: qty 1

## 2017-07-31 MED ORDER — VANCOMYCIN HCL 10 G IV SOLR
2500.0000 mg | Freq: Once | INTRAVENOUS | Status: DC
  Filled 2017-07-31: qty 2500

## 2017-07-31 MED ORDER — OXYCODONE HCL 5 MG/5ML PO SOLN: 5.0000 mg | Freq: Once | ORAL | Status: DC | PRN

## 2017-07-31 MED ORDER — FENTANYL CITRATE (PF) 100 MCG/2ML IJ SOLN
INTRAMUSCULAR | Status: DC | PRN
  Administered 2017-07-31: 25 ug via INTRAVENOUS

## 2017-07-31 MED ORDER — FENTANYL CITRATE (PF) 100 MCG/2ML IJ SOLN: 25.0000 ug | INTRAMUSCULAR | Status: DC | PRN

## 2017-07-31 MED ORDER — ONDANSETRON HCL 4 MG PO TABS: 4.0000 mg | ORAL_TABLET | Freq: Four times a day (QID) | ORAL | Status: DC | PRN

## 2017-07-31 MED ORDER — VANCOMYCIN HCL 10 G IV SOLR: 1750.0000 mg | INTRAVENOUS | Status: DC

## 2017-07-31 MED ORDER — ACETAMINOPHEN 650 MG RE SUPP: 650.0000 mg | Freq: Four times a day (QID) | RECTAL | Status: DC | PRN

## 2017-07-31 MED ORDER — HYDROCODONE-ACETAMINOPHEN 5-325 MG PO TABS
1.0000 | ORAL_TABLET | Freq: Four times a day (QID) | ORAL | Status: DC | PRN
  Administered 2017-07-31 – 2017-08-01 (×3): 2 via ORAL
  Administered 2017-08-03: 1 via ORAL
  Administered 2017-08-04: 2 via ORAL
  Filled 2017-07-31: qty 1
  Filled 2017-07-31 (×4): qty 2

## 2017-07-31 SURGICAL SUPPLY — 20 items
BAG URO CATCHER STRL LF (MISCELLANEOUS) ×2 IMPLANT
CATH FOLEY 2WAY SLVR  5CC 16FR (CATHETERS) ×1
CATH FOLEY 2WAY SLVR 5CC 16FR (CATHETERS) ×1 IMPLANT
CATH INTERMIT  6FR 70CM (CATHETERS) ×2 IMPLANT
CLOTH BEACON ORANGE TIMEOUT ST (SAFETY) ×2 IMPLANT
COVER FOOTSWITCH UNIV (MISCELLANEOUS) IMPLANT
EXTRACTOR STONE NITINOL NGAGE (UROLOGICAL SUPPLIES) IMPLANT
FIBER LASER TRAC TIP (UROLOGICAL SUPPLIES) IMPLANT
GLOVE BIO SURGEON STRL SZ8 (GLOVE) ×2 IMPLANT
GOWN STRL REUS W/TWL XL LVL3 (GOWN DISPOSABLE) ×2 IMPLANT
GUIDEWIRE ANG ZIPWIRE 038X150 (WIRE) ×2 IMPLANT
GUIDEWIRE STR DUAL SENSOR (WIRE) ×2 IMPLANT
IV NS 1000ML (IV SOLUTION) ×2
IV NS 1000ML BAXH (IV SOLUTION) ×1 IMPLANT
MANIFOLD NEPTUNE II (INSTRUMENTS) ×2 IMPLANT
PACK CYSTO (CUSTOM PROCEDURE TRAY) ×2 IMPLANT
SHEATH URETERAL 12FRX35CM (MISCELLANEOUS) IMPLANT
STENT CONTOUR 6FRX26X.038 (STENTS) ×2 IMPLANT
TUBE FEEDING 8FR 16IN STR KANG (MISCELLANEOUS) IMPLANT
TUBING CONNECTING 10 (TUBING) ×2 IMPLANT

## 2017-07-31 NOTE — Anesthesia Procedure Notes (Signed)
Procedure Name: LMA Insertion Date/Time: 07/31/2017 4:35 PM Performed by: Glory Buff, CRNA Pre-anesthesia Checklist: Patient identified, Emergency Drugs available, Suction available and Patient being monitored Patient Re-evaluated:Patient Re-evaluated prior to induction Oxygen Delivery Method: Circle system utilized Preoxygenation: Pre-oxygenation with 100% oxygen Induction Type: IV induction LMA: LMA with gastric port inserted LMA Size: 5.0 Number of attempts: 1 Placement Confirmation: positive ETCO2 Tube secured with: Tape Dental Injury: Teeth and Oropharynx as per pre-operative assessment

## 2017-07-31 NOTE — Progress Notes (Addendum)
Pharmacy Antibiotic Note  [name] is a 56 y.o. male admitted on 07/30/2017 with sepsis and UTI.  Patient with obstructing stone with R hydroureter and hydronephrosis.  Pharmacy has been consulted for Vancomycin and Zosyn dosing.  On TMP/DMZ for prophylaxis  3/1: GNR in BCx (BCID pending), TRH changing zosyn to cefepime.  Continue vancomycin for h/o enterococcus (from 04/2017 scrotal tissue/fournier's gangrene)  Today, 07/31/2017  Renal fx improving  Plan:  Increase Vancomycin to 2gm IV q48hr  Cefepime 1gm IV q12h  Follow Scr  Follow culture results and ability to narrow antibiotic  Addendum: BCID = K. Pneumoniae - cefepime to ceftriaxone 2gm IV q24h - no renal dose adjustment for ceftriaxone, pharmacy to sign off Height: 6\' 2"  (188 cm) Weight: (!) 392 lb 13.8 oz (178.2 kg) IBW/kg (Calculated) : 82.2  Temp (24hrs), Avg:100.8 F (38.2 C), Min:99.4 F (37.4 C), Max:103.3 F (39.6 C)  Recent Labs  Lab 07/30/17 1723 07/30/17 1759 07/30/17 2027 07/31/17 0445 07/31/17 0453  WBC 9.2  --   --  8.6  --   CREATININE 3.81*  --   --  3.29*  --   LATICACIDVEN 3.1* 3.47* 1.36  --  1.1    Estimated Creatinine Clearance: 41.7 mL/min (A) (by C-G formula based on SCr of 3.29 mg/dL (H)).    No Known Allergies  Antimicrobials this admission: Vancomycin 07/31/2017 >> Zosyn 07/31/2017 >>  3/1 Cefepime 3/1 >>  Dose adjustments this admission:  Microbiology results: 2/22 C. Diff: neg/neg 2/28 UCx: 2/28 BCx: GNR 2/2 (BCID = ) 3/1 MRSA PCR:   Thank you for allowing pharmacy to be a part of this patient's care.  Doreene Eland, PharmD, BCPS.   Pager: [phone] 07/31/2017 8:39 AM

## 2017-07-31 NOTE — Progress Notes (Signed)
PHARMACY - PHYSICIAN COMMUNICATION CRITICAL VALUE ALERT - BLOOD CULTURE IDENTIFICATION (BCID)  [name] is an 57 y.o. male who presented to [facility] on 07/30/2017 with a chief complaint of flank pain  Assessment:  1 YOM with klebsiella bacteremia related to UTI with ureteral stone causing obstruction.   Name of physician (or Provider) Contacted: Dr. Posey Pronto  Current antibiotics: vancomycin and cefepime  Changes to prescribed antibiotics recommended:  Cefepime to change to Ceftriaxone 2gm IV q24h.  MD to continue vancomycin for at least 48h  Results for orders placed or performed during the hospital encounter of 07/30/17  Blood Culture ID Panel (Reflexed) (Collected: 07/30/2017  5:32 PM)  Result Value Ref Range   Enterococcus species NOT DETECTED NOT DETECTED   Listeria monocytogenes NOT DETECTED NOT DETECTED   Staphylococcus species NOT DETECTED NOT DETECTED   Staphylococcus aureus NOT DETECTED NOT DETECTED   Streptococcus species NOT DETECTED NOT DETECTED   Streptococcus agalactiae NOT DETECTED NOT DETECTED   Streptococcus pneumoniae NOT DETECTED NOT DETECTED   Streptococcus pyogenes NOT DETECTED NOT DETECTED   Acinetobacter baumannii NOT DETECTED NOT DETECTED   Enterobacteriaceae species DETECTED (A) NOT DETECTED   Enterobacter cloacae complex NOT DETECTED NOT DETECTED   Escherichia coli NOT DETECTED NOT DETECTED   Klebsiella oxytoca NOT DETECTED NOT DETECTED   Klebsiella pneumoniae DETECTED (A) NOT DETECTED   Proteus species NOT DETECTED NOT DETECTED   Serratia marcescens NOT DETECTED NOT DETECTED   Carbapenem resistance NOT DETECTED NOT DETECTED   Haemophilus influenzae NOT DETECTED NOT DETECTED   Neisseria meningitidis NOT DETECTED NOT DETECTED   Pseudomonas aeruginosa NOT DETECTED NOT DETECTED   Candida albicans NOT DETECTED NOT DETECTED   Candida glabrata NOT DETECTED NOT DETECTED   Candida krusei NOT DETECTED NOT DETECTED   Candida parapsilosis NOT DETECTED  NOT DETECTED   Candida tropicalis NOT DETECTED NOT DETECTED    Doreene Eland, PharmD, BCPS.   07/31/2017 12:10 PM

## 2017-07-31 NOTE — ED Provider Notes (Signed)
CT scan showed evidence of a right-sided 3-4 mm stone.  With hydronephrosis Heiner ureter.  Also urine significantly infected.  Patient presented with septic type picture.  Improved with fluids lactic acid is now down below 2.  No other acute findings on the CT scan of the abdomen.  Discussed with hospitalist here.  And also discussed with Dr. Noah Delaine on-call for urology.  Patient will be admitted to stepdown unit at [facility].  Hospitalist will arrange the admitting paperwork.  Patient will need to be transported by [facility].  Patient currently with stable blood pressure in the 388 systolic.  But patient still is somewhat tentative and could get sicker.  Urology is planning to take him to the operating room to place a stent.  Admission will be to the hospitalist at [facility].  CRITICAL CARE Performed by: Fredia Sorrow Total critical care time: 30 minutes Critical care time was exclusive of separately billable procedures and treating other patients. Critical care was necessary to treat or prevent imminent or life-threatening deterioration. Critical care was time spent personally by me on the following activities: development of treatment plan with patient and/or surrogate as well as nursing, discussions with consultants, evaluation of patient's response to treatment, examination of patient, obtaining history from patient or surrogate, ordering and performing treatments and interventions, ordering and review of laboratory studies, ordering and review of radiographic studies, pulse oximetry and re-evaluation of patient's condition.    Fredia Sorrow, MD 07/31/17 [phone]

## 2017-07-31 NOTE — Consult Note (Signed)
Urology Consult  Referring physician: Dr. Posey Pronto Reason for referral: sepsis with ureteral calculus  Chief Complaint: right flank pain  History of Present Illness: [name] is a 56yo with a hx of MI, morbid obesity, atrial flutter, DMII, and multiple myelomoa who presented to [facility] ER with a 1 week history of fevers to 101, right flank pain and confusion. He fell yesterday prompting presentation to the ER. The right flank pain is sharp, moderating constant and nonradiating. He has associated fever. No LUTS. This is his first stone event. Ct shows a 107m proximal right ureteral calculus with perinephric standing. Creatinine is elevated at 3.3 WBC count 8.6. He had a fever in the ER of 103. [facility]  Past Medical History:  Diagnosis Date  . Anemia   . Arteriosclerotic cardiovascular disease (ASCVD)    MI-2000s; stent to the proximal LAD and diagonal in 2001; stress nuclear in 2008-impaired exercise capacity, left ventricular dilatation, moderately to severely depressed EF, apical, inferior and anteroseptal scar  . Arthritis   . Atrial flutter ([place]   . 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 Cellulitis of leg    both legs  . Chronic diarrhea   . Chronic kidney disease, stage 3, mod decreased GFR (HCC)    Creatinine of 1.84 in 06/2011 and 1.5 in 07/2011  . Diabetes mellitus    Insulin  . Dysrhythmia    AFlutter  . GERD (gastroesophageal reflux disease)   . Gout   . Hyperlipidemia   . Hypertension   . Injection site reaction   . Multiple myeloma 07/01/2011  . [place]  . Obesity   . Pedal edema    Venous insufficiency  . Sleep apnea    uses cpap  . Ulcer    Past Surgical History:  Procedure Laterality Date  . ABSCESS DRAINAGE     Scrotal  . BIOPSY  01/02/2012   Procedure: BIOPSY;  Surgeon: NRogene Houston MD;  Location[facility] END[facility];  Service: Endoscopy;  Laterality: N/A;  . BONE MARROW BIOPSY  05/13/11  . CARDIAC  CATHETERIZATION     cardiac stent  . CARDIOVERSION N/A 10/13/2012   Procedure: CARDIOVERSION;  Surgeon: RYehuda Savannah MD;  Location: [facility] ORS;  Service: Cardiovascular;  Laterality: N/A;  . CATARACT EXTRACTION W/PHACO Left 02/13/2014   Procedure: CATARACT EXTRACTION PHACO AND INTRAOCULAR LENS PLACEMENT ([place];  Surgeon: KTonny Branch MD;  Location: [facility] ORS;  Service: Ophthalmology;  Laterality: Left;  CDE:  7.67  . CATARACT EXTRACTION W/PHACO Right 03/02/2014   Procedure: CATARACT EXTRACTION PHACO AND INTRAOCULAR LENS PLACEMENT RIGHT EYE CDE=16.81;  Surgeon: KTonny Branch MD;  Location: [facility] ORS;  Service: Ophthalmology;  Laterality: Right;  . COLONOSCOPY  11/28/2011   Procedure: COLONOSCOPY;  Surgeon: NRogene Houston MD;  Location[facility];  Service: Endoscopy;  Laterality: N/A;  930  . ESOPHAGOGASTRODUODENOSCOPY  01/02/2012   Procedure: ESOPHAGOGASTRODUODENOSCOPY (EGD);  Surgeon: NRogene Houston MD;  Location[facility];  Service: Endoscopy;  Laterality: N/A;  100  . ESOPHAGOGASTRODUODENOSCOPY N/A 09/20/2012   Procedure: ESOPHAGOGASTRODUODENOSCOPY (EGD);  Surgeon: NRogene Houston MD;  Location[facility];  Service: Endoscopy;  Laterality: N/A;  . EUS N/A 10/07/2012   Procedure: UPPER ENDOSCOPIC ULTRASOUND (EUS) LINEAR;  Surgeon: DMilus Banister MD;  [facility] ENDOS[facility];  Service: Endoscopy;  Laterality: N/A;  . INCISION AND DRAINAGE ABSCESS N/A 04/14/2017   Procedure: INCISION AND DRAINAGE ABSCESS;  Surgeon: WCeasar Mons  MD;  Location: [facility] OR[facility];  Service: Urology;  Laterality: N/A;  . INCISION AND DRAINAGE ABSCESS ANAL    . IRRIGATION AND DEBRIDEMENT ABSCESS N/A 04/15/2017   Procedure: DEBRIDEMENT SCROTAL WOUND AND DRESSING CHANGE;  Surgeon: Ceasar Mons, MD;  Location: [facility];  Service: Urology;  Laterality: N/A;  . IRRIGATION AND DEBRIDEMENT ABSCESS N/A 04/17/2017   Procedure: IRRIGATION AND DEBRIDEMENT ABSCESS;  Surgeon: Ceasar Mons, MD;   Location[facility];  Service: Urology;  Laterality: N/A;  RM 3  . LAPAROSCOPIC GASTRIC BANDING  2006   has been removed  . OSTECTOMY Right 04/08/2017   Procedure: OSTECTOMY RIGHT GREAT TOE;  Surgeon: Caprice Beaver, DPM;  Location: [facility] ORS;  Service: Podiatry;  Laterality: Right;  . PORT-A-CATH REMOVAL Left 12/07/2012   Procedure: REMOVAL PORT-A-CATH;  Surgeon: Scherry Ran, MD;  Location: [facility] ORS;  Service: General;  Laterality: Left;  . PORTACATH PLACEMENT  07/07/2011   Procedure: INSERTION PORT-A-CATH;  Surgeon: Scherry Ran, MD;  Location: [facility] ORS;  Service: General;  Laterality: N/A;  . PORTACATH PLACEMENT N/A 12/07/2012   Procedure: INSERTION PORT-A-CATH;  Surgeon: Scherry Ran, MD;  Location: [facility] ORS;  Service: General;  Laterality: N/A;  Attempted portacath placement on left and right side  . WOUND DEBRIDEMENT Right 04/08/2017   Procedure: EXCISION ULCERATION RIGHT GREAT TOE;  Surgeon: Caprice Beaver, DPM;  Location: [facility] ORS;  Service: Podiatry;  Laterality: Right;  . WRIST SURGERY     Left; removal of bone fragment    Medications: I have reviewed the patient's current medications. Allergies: No Known Allergies  Family History  Problem Relation Age of Onset  . Heart disease Mother   . Cancer Mother   . Diabetes Father   . Arthritis Unknown   . Anesthesia problems Neg Hx   . Hypotension Neg Hx   . Malignant hyperthermia Neg Hx   . Pseudochol deficiency Neg Hx    Social History:  reports that he quit smoking about 16 years ago. His smoking use included cigarettes and cigars. He has a 0.25 pack-year smoking history. he has never used smokeless tobacco. He reports that he does not drink alcohol or use drugs.  Review of Systems  Constitutional: Positive for chills and fever.  Genitourinary: Positive for flank pain.  Neurological: Positive for weakness.  All other systems reviewed and are negative.   Physical Exam:  Vital signs in last 24 hours: Temp:  [99.4 F  (37.4 C)-103.3 F (39.6 C)] 100.1 F (37.8 C) (03/01 0800) Pulse Rate:  [76-106] 99 (03/01 0900) Resp:  [16-36] 30 (03/01 0900) BP: (75-140)/(49-108) 132/69 (03/01 0414) SpO2:  [92 %-100 %] 92 % (03/01 0900) Weight:  [172.4 kg (380 lb)-178.2 kg (392 lb 13.8 oz)] 178.2 kg (392 lb 13.8 oz) (03/01 0414) Physical Exam  Constitutional: He is oriented to person, place, and time. He appears well-developed and well-nourished.  HENT:  Head: Normocephalic and atraumatic.  Eyes: EOM are normal. Pupils are equal, round, and reactive to light.  Neck: Normal range of motion. No thyromegaly present.  Cardiovascular: Normal rate and regular rhythm.  Respiratory: Effort normal. He has wheezes.  GI: Soft. He exhibits no distension. There is no tenderness.  Genitourinary: Testes normal and penis normal. Uncircumcised.  Musculoskeletal: Normal range of motion. He exhibits no edema.  Neurological: He is alert and oriented to person, place, and time.  Skin: Skin is warm and dry.  Psychiatric: He has a normal mood and affect. His behavior is normal.  Judgment and thought content normal.    Laboratory Data:  Results for orders placed or performed during the hospital encounter of 07/30/17 (from the past 72 hour(s))  Urinalysis, Routine w reflex microscopic     Status: Abnormal   Collection Time: 07/30/17  5:10 PM  Result Value Ref Range   Color, Urine YELLOW YELLOW   APPearance CLOUDY (A) CLEAR   Specific Gravity, Urine 1.012 1.005 - 1.030   pH 5.0 5.0 - 8.0   Glucose, UA NEGATIVE NEGATIVE mg/dL   Hgb urine dipstick LARGE (A) NEGATIVE   Bilirubin Urine NEGATIVE NEGATIVE   Ketones, ur NEGATIVE NEGATIVE mg/dL   Protein, ur 100 (A) NEGATIVE mg/dL   Nitrite POSITIVE (A) NEGATIVE   Leukocytes, UA LARGE (A) NEGATIVE   RBC / HPF TOO NUMEROUS TO COUNT 0 - 5 RBC/hpf   WBC, UA TOO NUMEROUS TO COUNT 0 - 5 WBC/hpf   Bacteria, UA RARE (A) NONE SEEN   Squamous Epithelial / LPF 0-5 (A) NONE SEEN   WBC Clumps  PRESENT    Mucus PRESENT     Comment: Performed at [facility], [address], [place], [place] [address]  Comprehensive metabolic panel     Status: Abnormal   Collection Time: 07/30/17  5:23 PM  Result Value Ref Range   Sodium 132 (L) 135 - 145 mmol/L   Potassium 5.8 (H) 3.5 - 5.1 mmol/L   Chloride 105 101 - 111 mmol/L   CO2 17 (L) 22 - 32 mmol/L   Glucose, Bld 97 65 - 99 mg/dL   BUN 52 (H) 6 - 20 mg/dL   Creatinine, Ser 3.81 (H) 0.61 - 1.24 mg/dL   Calcium 8.0 (L) 8.9 - 10.3 mg/dL   Total Protein 5.8 (L) 6.5 - 8.1 g/dL   Albumin 2.7 (L) 3.5 - 5.0 g/dL   AST 62 (H) 15 - 41 U/L   ALT 26 17 - 63 U/L   Alkaline Phosphatase 48 38 - 126 U/L   Total Bilirubin 0.6 0.3 - 1.2 mg/dL   GFR calc non Af Amer 16 (L) >60 mL/min   GFR calc Af Amer 19 (L) >60 mL/min    Comment: (NOTE) The eGFR has been calculated using the CKD EPI equation. This calculation has not been validated in all clinical situations. eGFR's persistently <60 mL/min signify possible Chronic Kidney Disease.    Anion gap 10 5 - 15    Comment: Performed at [facility], [address] [place], [place] [address]  CBC with Differential     Status: Abnormal   Collection Time: 07/30/17  5:23 PM  Result Value Ref Range   WBC 9.2 4.0 - 10.5 K/uL    Comment: WHITE COUNT CONFIRMED ON SMEAR   RBC 3.06 (L) 4.22 - 5.81 MIL/uL   Hemoglobin 9.4 (L) 13.0 - 17.0 g/dL   HCT 29.6 (L) 39.0 - 52.0 %   MCV 96.7 78.0 - 100.0 fL   MCH 30.7 26.0 - 34.0 pg   MCHC 31.8 30.0 - 36.0 g/dL   RDW 16.9 (H) 11.5 - 15.5 %   Platelets 133 (L) 150 - 400 K/uL    Comment: SPECIMEN CHECKED FOR CLOTS PLATELET COUNT CONFIRMED BY SMEAR    Neutrophils Relative % 87 %   Neutro Abs 8.1 1.7 - 7.7 K/uL   Lymphocytes Relative 8 %   Lymphs Abs 0.7 0.7 - 4.0 K/uL   Monocytes Relative 5 %   Monocytes Absolute 0.4 0.1 - 1.0 K/uL   Eosinophils  Relative 0 %   Eosinophils Absolute 0.0 0.0 - 0.7 K/uL   Basophils Relative 0 %   Basophils Absolute 0.0 0.0 - 0.1  K/uL   WBC Morphology WHITE COUNT CONFIRMED ON SMEAR     Comment: BURR CELLS Performed at [facility], [address] [place], [place] [address]   Lactic acid, plasma     Status: Abnormal   Collection Time: 07/30/17  5:23 PM  Result Value Ref Range   Lactic Acid, Venous 3.1 (HH) 0.5 - 1.9 mmol/L    Comment: CRITICAL RESULT CALLED TO, READ BACK BY AND VERIFIED WITH: OAKLEY,B ON 07/30/17 AT 1820 BY LOY,C Performed at [facility], [address] [place], [place] [address]   Culture, blood (Routine X 2) w Reflex to ID Panel     Status: None (Preliminary result)   Collection Time: 07/30/17  5:32 PM  Result Value Ref Range   Specimen Description LEFT ANTECUBITAL    Special Requests      BOTTLES DRAWN AEROBIC AND ANAEROBIC Blood Culture adequate volume   Culture  Setup Time      GRAM NEGATIVE RODS AEB ANA Gram Stain Report Called to,Read Back By and Verified With: HEAZNER @ [id number] ON [id number] BY HENDERSON L. Performed at [facility], [address] [place], [place] [address]    Culture PENDING    Report Status PENDING   Culture, blood (Routine X 2) w Reflex to ID Panel     Status: None (Preliminary result)   Collection Time: 07/30/17  5:32 PM  Result Value Ref Range   Specimen Description BLOOD LEFT ARM    Special Requests      BOTTLES DRAWN AEROBIC ONLY Blood Culture results may not be optimal due to an inadequate volume of blood received in culture bottles   Culture  Setup Time      GRAM NEGATIVE RODS Gram Stain Report Called to,Read Back By and Verified With: MCNABB @ 0258 ON [id number] BY HENDERSON L. Performed at [facility], [address], [place], [place] [address]    Culture PENDING    Report Status PENDING   Procalcitonin - Baseline     Status: None   Collection Time: 07/30/17  5:32 PM  Result Value Ref Range   Procalcitonin >150.00 ng/mL    Comment:        Interpretation: PCT >= 10 ng/mL: Important systemic inflammatory response, almost exclusively due to severe  bacterial sepsis or septic shock. (NOTE)       Sepsis PCT Algorithm           Lower Respiratory Tract                                      Infection PCT Algorithm    ----------------------------     ----------------------------         PCT < 0.25 ng/mL                PCT < 0.10 ng/mL         Strongly encourage             Strongly discourage   discontinuation of antibiotics    initiation of antibiotics    ----------------------------     -----------------------------       PCT 0.25 - 0.50 ng/mL            PCT 0.10 - 0.25 ng/mL  OR       >80% decrease in PCT            Discourage initiation of                                            antibiotics      Encourage discontinuation           of antibiotics    ----------------------------     -----------------------------         PCT >= 0.50 ng/mL              PCT 0.26 - 0.50 ng/mL                AND       <80% decrease in PCT             Encourage initiation of                                             antibiotics       Encourage continuation           of antibiotics    ----------------------------     -----------------------------        PCT >= 0.50 ng/mL                  PCT > 0.50 ng/mL               AND         increase in PCT                  Strongly encourage                                      initiation of antibiotics    Strongly encourage escalation           of antibiotics                                     -----------------------------                                           PCT <= 0.25 ng/mL                                                 OR                                        > 80% decrease in PCT                                     Discontinue / Do not initiate  antibiotics Performed at [facility], [address] [place], [place] [address]   I-Stat CG4 Lactic Acid, ED     Status: Abnormal   Collection Time: 07/30/17  5:59 PM  Result Value Ref Range    Lactic Acid, Venous 3.47 (HH) 0.5 - 1.9 mmol/L   Comment NOTIFIED PHYSICIAN   Influenza panel by PCR (type A & B)     Status: None   Collection Time: 07/30/17  6:00 PM  Result Value Ref Range   Influenza A By PCR NEGATIVE NEGATIVE   Influenza B By PCR NEGATIVE NEGATIVE    Comment: (NOTE) The Xpert Xpress Flu assay is intended as an aid in the diagnosis of  influenza and should not be used as a sole basis for treatment.  This  assay is FDA approved for nasopharyngeal swab specimens only. Nasal  washings and aspirates are unacceptable for Xpert Xpress Flu testing. Performed at [facility], [address] [place], [place] [address]   I-Stat CG4 Lactic Acid, ED  (not at  [facility])     Status: None   Collection Time: 07/30/17  8:27 PM  Result Value Ref Range   Lactic Acid, Venous 1.36 0.5 - 1.9 mmol/L  Glucose, capillary     Status: None   Collection Time: 07/31/17  4:17 AM  Result Value Ref Range   Glucose-Capillary 99 65 - 99 mg/dL  CBC     Status: Abnormal   Collection Time: 07/31/17  4:45 AM  Result Value Ref Range   WBC 8.6 4.0 - 10.5 K/uL   RBC 2.75 (L) 4.22 - 5.81 MIL/uL   Hemoglobin 8.5 (L) 13.0 - 17.0 g/dL   HCT 26.3 (L) 39.0 - 52.0 %   MCV 95.6 78.0 - 100.0 fL   MCH 30.9 26.0 - 34.0 pg   MCHC 32.3 30.0 - 36.0 g/dL   RDW 16.7 (H) 11.5 - 15.5 %   Platelets 129 (L) 150 - 400 K/uL    Comment: Performed at [facility], [place] [address], [place], [place] [address]  Comprehensive metabolic panel     Status: Abnormal   Collection Time: 07/31/17  4:45 AM  Result Value Ref Range   Sodium 141 135 - 145 mmol/L   Potassium 5.0 3.5 - 5.1 mmol/L   Chloride 113 (H) 101 - 111 mmol/L   CO2 18 (L) 22 - 32 mmol/L   Glucose, Bld 109 (H) 65 - 99 mg/dL   BUN 55 (H) 6 - 20 mg/dL   Creatinine, Ser 3.29 (H) 0.61 - 1.24 mg/dL   Calcium 7.5 (L) 8.9 - 10.3 mg/dL   Total Protein 5.3 (L) 6.5 - 8.1 g/dL   Albumin 2.5 (L) 3.5 - 5.0 g/dL   AST 107 (H) 15 - 41 U/L   ALT 33 17 -  63 U/L   Alkaline Phosphatase 51 38 - 126 U/L   Total Bilirubin 0.9 0.3 - 1.2 mg/dL   GFR calc non Af Amer 19 (L) >60 mL/min   GFR calc Af Amer 22 (L) >60 mL/min    Comment: (NOTE) The eGFR has been calculated using the CKD EPI equation. This calculation has not been validated in all clinical situations. eGFR's persistently <60 mL/min signify possible Chronic Kidney Disease.    Anion gap 10 5 - 15    Comment: Performed at [facility], [place] [address], [place], [place] [address]  APTT     Status: Abnormal   Collection Time: 07/31/17  4:45 AM  Result Value Ref Range   aPTT 51 (H) 24 - 36 seconds    Comment:        IF BASELINE aPTT IS ELEVATED, SUGGEST PATIENT RISK ASSESSMENT BE USED TO DETERMINE APPROPRIATE ANTICOAGULANT THERAPY. Performed at [facility], [place] [address] [place], [place] [address]   Lactic acid, plasma     Status: None   Collection Time: 07/31/17  4:53 AM  Result Value Ref Range   Lactic Acid, Venous 1.1 0.5 - 1.9 mmol/L    Comment: Performed at [facility], [place] [address] [place], [place] [address]  Glucose, capillary     Status: Abnormal   Collection Time: 07/31/17  7:22 AM  Result Value Ref Range   Glucose-Capillary 114 (H) 65 - 99 mg/dL  Lactic acid, plasma     Status: None   Collection Time: 07/31/17  8:29 AM  Result Value Ref Range   Lactic Acid, Venous 0.9 0.5 - 1.9 mmol/L    Comment: Performed at [facility], [place] [address], [place], [place] [address]   *Note: Due to a large number of results and/or encounters for the requested time period, some results have not been displayed. A complete set of results can be found in Results Review.   Recent Results (from the past 240 hour(s))  C difficile quick screen w PCR reflex     Status: None   Collection Time: 07/24/17  4:00 PM  Result Value Ref Range Status   C Diff antigen NEGATIVE NEGATIVE Final   C Diff toxin NEGATIVE  NEGATIVE Final   C Diff interpretation No C. difficile detected.  Final    Comment: Performed at [facility], [address] [place], [place] [address]  Culture, blood (Routine X 2) w Reflex to ID Panel     Status: None (Preliminary result)   Collection Time: 07/30/17  5:32 PM  Result Value Ref Range Status   Specimen Description LEFT ANTECUBITAL  Final   Special Requests   Final    BOTTLES DRAWN AEROBIC AND ANAEROBIC Blood Culture adequate volume   Culture  Setup Time   Final    GRAM NEGATIVE RODS AEB ANA Gram Stain Report Called to,Read Back By and Verified With: HEAZNER @ 7782 ON [id number] BY HENDERSON L. Performed at [facility], [address], [place], [place] [address]    Culture PENDING  Incomplete   Report Status PENDING  Incomplete  Culture, blood (Routine X 2) w Reflex to ID Panel     Status: None (Preliminary result)   Collection Time: 07/30/17  5:32 PM  Result Value Ref Range Status   Specimen Description BLOOD LEFT ARM  Final   Special Requests   Final    BOTTLES DRAWN AEROBIC ONLY Blood Culture results may not be optimal due to an inadequate volume of blood received in culture bottles   Culture  Setup Time   Final    GRAM NEGATIVE RODS Gram Stain Report Called to,Read Back By and Verified With: MCNABB @ 0086 ON [id number] BY HENDERSON L. Performed at [facility], [address] [place], [place] [address]    Culture PENDING  Incomplete   Report Status PENDING  Incomplete   Creatinine: Recent Labs    07/30/17 1723 07/31/17 0445  CREATININE 3.81* 3.29*   Baseline Creatinine: 1.3  Impression/Assessment:  56yo with right ureteral calculus and sepsis from a urinary source  Plan:  1. The risks/benefits/alternatives to right ureteral stent placement was explained to  the patient and brother and they understand and wish to proceed with surgery. The patient will be taken urgently to the [facility] 07/31/2017, 9:27 AM

## 2017-07-31 NOTE — Transfer of Care (Signed)
Immediate Anesthesia Transfer of Care Note  Patient: [name]  Procedure(s) Performed: CYSTOSCOPY WITH RETROGRADE PYELOGRAM/URETERAL STENT PLACEMENT (Right Ureter)  Patient Location: PACU  Anesthesia Type:General  Level of Consciousness: awake, alert  and patient cooperative  Airway & Oxygen Therapy: Patient Spontanous Breathing and Patient connected to face mask oxygen  Post-op Assessment: Report given to RN and Post -op Vital signs reviewed and stable  Post vital signs: Reviewed and stable  Last Vitals:  Vitals:   07/31/17 1542 07/31/17 1719  BP: 127/75 127/69  Pulse: 80 82  Resp: (!) 22 18  Temp: 37 C (!) 36.4 C  SpO2: 98% 100%    Last Pain:  Vitals:   07/31/17 1719  TempSrc:   PainSc: 0-No pain         Complications: No apparent anesthesia complications

## 2017-07-31 NOTE — Progress Notes (Signed)
Pharmacy Antibiotic Note  [name] is a 57 y.o. male admitted on 07/30/2017 with sepsis and UTI.  Pharmacy has been consulted for Vancomycin and Zosyn dosing.  Plan: Vancomycin 2gm iv x1, then 1750mg  iv q48hr Goal AUC = 400 - 500 for all indications, except meningitis (goal AUC > 500 and Cmin 15-20 mcg/mL)   Zosyn 3.375g IV Q8H infused over 4hrs.    Height: 6\' 2"  (188 cm) Weight: (!) 392 lb 13.8 oz (178.2 kg) IBW/kg (Calculated) : 82.2  Temp (24hrs), Avg:100.8 F (38.2 C), Min:99.4 F (37.4 C), Max:103.3 F (39.6 C)  Recent Labs  Lab 07/30/17 1723 07/30/17 1759 07/30/17 2027 07/31/17 0445  WBC 9.2  --   --  8.6  CREATININE 3.81*  --   --   --   LATICACIDVEN 3.1* 3.47* 1.36  --     Estimated Creatinine Clearance: 36 mL/min (A) (by C-G formula based on SCr of 3.81 mg/dL (H)).    No Known Allergies  Antimicrobials this admission: Vancomycin 07/31/2017 >> Zosyn 07/31/2017 >>   Dose adjustments this admission:   Microbiology results: pending  Thank you for allowing pharmacy to be a part of this patient's care.  Nani Skillern Crowford 07/31/2017 5:27 AM

## 2017-07-31 NOTE — Anesthesia Preprocedure Evaluation (Signed)
Anesthesia Evaluation  Patient identified by MRN, date of birth, ID band Patient awake    Reviewed: Allergy & Precautions, NPO status , Patient's Chart, lab work & pertinent test results, reviewed documented beta blocker date and time   History of Anesthesia Complications Negative for: history of anesthetic complications  Airway Mallampati: III  TM Distance: >3 FB Neck ROM: Full    Dental  (+) Teeth Intact   Pulmonary neg shortness of breath, sleep apnea and Continuous Positive Airway Pressure Ventilation , neg COPD, former smoker,    breath sounds clear to auscultation       Cardiovascular hypertension, Pt. on medications and Pt. on home beta blockers + Past MI, + Cardiac Stents and +CHF  + dysrhythmias Atrial Fibrillation  Rhythm:Irregular     Neuro/Psych  Neuromuscular disease negative psych ROS   GI/Hepatic GERD  Medicated and Controlled,  Endo/Other  diabetes, Type 2, Insulin DependentMorbid obesity  Renal/GU Renal InsufficiencyRenal disease     Musculoskeletal  (+) Arthritis ,   Abdominal   Peds  Hematology  (+) anemia ,   Anesthesia Other Findings Ef 35-40%  Reproductive/Obstetrics                             Anesthesia Physical Anesthesia Plan  ASA: III  Anesthesia Plan: General   Post-op Pain Management:    Induction: Intravenous  PONV Risk Score and Plan: 2 and Ondansetron and Dexamethasone  Airway Management Planned: LMA  Additional Equipment: None  Intra-op Plan:   Post-operative Plan: Extubation in OR  Informed Consent: I have reviewed the patients History and Physical, chart, labs and discussed the procedure including the risks, benefits and alternatives for the proposed anesthesia with the patient or authorized representative who has indicated his/her understanding and acceptance.   Dental advisory given  Plan Discussed with: CRNA and Surgeon  Anesthesia  Plan Comments:         Anesthesia Quick Evaluation

## 2017-07-31 NOTE — Progress Notes (Signed)
ANTICOAGULATION CONSULT NOTE  - follow up  Pharmacy Consult for heparin while off rivaroxaban Indication: atrial fibrillation  No Known Allergies  Patient Measurements: Height: '6\' 2"'  (188 cm) Weight: (!) 392 lb 13.8 oz (178.2 kg) IBW/kg (Calculated) : 82.2 HEPARIN DW (KG): 125.4   Vital Signs: Temp: 98 F (36.7 C) (03/01 1200) Temp Source: Axillary (03/01 1200) BP: 132/69 (03/01 0414) Pulse Rate: 99 (03/01 0900)  Labs: Recent Labs    07/30/17 1723 07/31/17 0445 07/31/17 1240  HGB 9.4* 8.5*  --   HCT 29.6* 26.3*  --   PLT 133* 129*  --   APTT  --  51* 98*  HEPARINUNFRC  --   --  1.46*  CREATININE 3.81* 3.29*  --    Estimated Creatinine Clearance: 41.7 mL/min (A) (by C-G formula based on SCr of 3.29 mg/dL (H)).   Assessment: 57 yo male with PMH CHF, CKD stage III, T2DM, multiple myeloma, a.fib on Xarelto, and morbid obesity.  Presented to ED with fever, confusion and weakness.  Dx sepsis secondary to UTI with right hydroureter and hydronephrosis with proximal obstructing stone.   Pt going to [facility] for urology to place stent.  Pharmacy asked to dose heparin for a.fib.    - Pt's last Xarelto dose was 2/27 at 2200.   - Heparin started 3/1 at 0500 - baseline aPTT = 51 sec (elevated), unfortunately baseline anti-Xa level was d/c'd   Today, 07/31/2017  Initial anti-Xa level/heparin level SUPRAtherapeutic as would expect with recent rivaroxaban use.  APTT is at goal = 98 sec  CBC: Hgb decreased but consistent with recent values from Jan.  pltc mildly decreased - watch trend  No evidence of bleeding  Goal of Therapy:  Heparin level 0.3-0.7 units/ml  PTT 66-102s   Plan:   Continue Heparin infusion at 1900 units/hr as aPTT is within therapeutic range  For OR at 1600, d/w urology regarding heparin gtt, OK to continue  Heparin levels will be elevated due to rivaroxaban use and expect will take longer to be cleared due to AKI.  APTT will be used to dose heparin.    Check  6-8h aPTT  Daily aPTT and heparin levels, CBC  Monitor for bleeding  Doreene Eland, PharmD, BCPS.   Pager: [phone] 07/31/2017 3:10 PM

## 2017-07-31 NOTE — H&P (Addendum)
History and Physical    JAKI HAMMERSCHMIDT KTG:[id number] DOB: 1961/03/08 DOA: 07/30/2017  PCP: [name], MD   Patient coming from: Home  Chief Complaint: Fever and confusion  HPI: [name] is a 57 y.o. male with medical history significant for chronic systolic CHF with EF 42-87%, CKD stage III, type 2 diabetes-insulin-dependent, multiple myeloma on chronic steroid treatment, atrial flutter on Xarelto, and morbid obesity who was brought to the emergency department on account of some fever, confusion, and weakness that began 2 nights ago.  The patient was noted to have a temperature of 103.3 Fahrenheit on arrival to the ED and was initially somewhat hypotensive. He has had one episode of diarrhea, but no nausea or vomiting. Patient has also had a nonproductive cough as well as diffuse abdominal pain. Denies dysuria or scrotal pain or swelling.   ED Course: Patient started on multiple IV fluid bolus on account of sepsis with initial lactic acid of approximately 3.47 which has now subsequently improved to 1.36.  Initial blood pressure readings were in the 68T systolic range but have now stabilized with the latest blood pressure being 127/66.  He was started on IV azithromycin and Rocephin with some initial concern for pneumonia but is noted to have a UTI and imaging studies with CT chest and abdomen is remarkable for cardiomegaly with left diaphragmatic hernia as well as right greater than left perinephric edema with right-sided mild hydronephrosis and hydroureter.  Patient is also noted to have AK I with creatinine of 3.8 whereas baseline is typically 1.5-1.6.  Review of Systems: All others reviewed and otherwise negative.  Past Medical History:  Diagnosis Date  . Anemia   . Arteriosclerotic cardiovascular disease (ASCVD)    MI-2000s; stent to the proximal LAD and diagonal in 2001; stress nuclear in 2008-impaired exercise capacity, left ventricular dilatation, moderately to severely  depressed EF, apical, inferior and anteroseptal scar  . Arthritis   . Atrial flutter ([place])   . 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 Cellulitis of leg    both legs  . Chronic diarrhea   . Chronic kidney disease, stage 3, mod decreased GFR (HCC)    Creatinine of 1.84 in 06/2011 and 1.5 in 07/2011  . Diabetes mellitus    Insulin  . Dysrhythmia    AFlutter  . GERD (gastroesophageal reflux disease)   . Gout   . Hyperlipidemia   . Hypertension   . Injection site reaction   . Multiple myeloma 07/01/2011  . Myocardial infarction ([place]) 2000  . Obesity   . Pedal edema    Venous insufficiency  . Sleep apnea    uses cpap  . Ulcer     Past Surgical History:  Procedure Laterality Date  . ABSCESS DRAINAGE     Scrotal  . BIOPSY  01/02/2012   Procedure: BIOPSY;  Surgeon: Rogene Houston, MD;  [facility];  Service: Endoscopy;  Laterality: N/A;  . BONE MARROW BIOPSY  05/13/11  . CARDIAC CATHETERIZATION     cardiac stent  . CARDIOVERSION N/A 10/13/2012   Procedure: CARDIOVERSION;  Surgeon: Yehuda Savannah, MD;  Location: [facility];  Service: Cardiovascular;  Laterality: N/A;  . CATARACT EXTRACTION W/PHACO Left 02/13/2014   Procedure: CATARACT EXTRACTION PHACO AND INTRAOCULAR LENS PLACEMENT ([place]);  Surgeon: Tonny Branch, MD;  Location: [facility];  Service: Ophthalmology;  Laterality: Left;  CDE:  7.67  . CATARACT EXTRACTION W/PHACO Right 03/02/2014   Procedure: CATARACT EXTRACTION PHACO AND INTRAOCULAR LENS PLACEMENT RIGHT EYE  CDE=16.81;  Surgeon: Tonny Branch, MD;  Location: [facility];  Service: Ophthalmology;  Laterality: Right;  . COLONOSCOPY  11/28/2011   Procedure: COLONOSCOPY;  Surgeon: Rogene Houston, MD;  [facility];  Service: Endoscopy;  Laterality: N/A;  930  . ESOPHAGOGASTRODUODENOSCOPY  01/02/2012   Procedure: ESOPHAGOGASTRODUODENOSCOPY (EGD);  Surgeon: Rogene Houston, MD;  [facility];  Service: Endoscopy;  Laterality: N/A;  100  . ESOPHAGOGASTRODUODENOSCOPY  N/A 09/20/2012   Procedure: ESOPHAGOGASTRODUODENOSCOPY (EGD);  Surgeon: Rogene Houston, MD;  [facility];  Service: Endoscopy;  Laterality: N/A;  . EUS N/A 10/07/2012   Procedure: UPPER ENDOSCOPIC ULTRASOUND (EUS) LINEAR;  Surgeon: Milus Banister, MD;  Location: [facility];  Service: Endoscopy;  Laterality: N/A;  . INCISION AND DRAINAGE ABSCESS N/A 04/14/2017   Procedure: INCISION AND DRAINAGE ABSCESS;  Surgeon: Ceasar Mons, MD;  Location: [facility];  Service: Urology;  Laterality: N/A;  . INCISION AND DRAINAGE ABSCESS ANAL    . IRRIGATION AND DEBRIDEMENT ABSCESS N/A 04/15/2017   Procedure: DEBRIDEMENT SCROTAL WOUND AND DRESSING CHANGE;  Surgeon: Ceasar Mons, MD;  [facility];  Service: Urology;  Laterality: N/A;  . IRRIGATION AND DEBRIDEMENT ABSCESS N/A 04/17/2017   Procedure: IRRIGATION AND DEBRIDEMENT ABSCESS;  Surgeon: Ceasar Mons, MD;  [facility];  Service: Urology;  Laterality: N/A;  RM 3  . LAPAROSCOPIC GASTRIC BANDING  2006   has been removed  . OSTECTOMY Right 04/08/2017   Procedure: OSTECTOMY RIGHT GREAT TOE;  Surgeon: Caprice Beaver, DPM;  Location: [facility];  Service: Podiatry;  Laterality: Right;  . PORT-A-CATH REMOVAL Left 12/07/2012   Procedure: REMOVAL PORT-A-CATH;  Surgeon: Scherry Ran, MD;  Location: [facility];  Service: General;  Laterality: Left;  . PORTACATH PLACEMENT  07/07/2011   Procedure: INSERTION PORT-A-CATH;  Surgeon: Scherry Ran, MD;  Location: [facility];  Service: General;  Laterality: N/A;  . PORTACATH PLACEMENT N/A 12/07/2012   Procedure: INSERTION PORT-A-CATH;  Surgeon: Scherry Ran, MD;  Location: [facility];  Service: General;  Laterality: N/A;  Attempted portacath placement on left and right side  . WOUND DEBRIDEMENT Right 04/08/2017   Procedure: EXCISION ULCERATION RIGHT GREAT TOE;  Surgeon: Caprice Beaver, DPM;  Location: [facility];  Service: Podiatry;  Laterality: Right;  . WRIST SURGERY      Left; removal of bone fragment     reports that he quit smoking about 16 years ago. His smoking use included cigarettes and cigars. He has a 0.25 pack-year smoking history. he has never used smokeless tobacco. He reports that he does not drink alcohol or use drugs.  No Known Allergies  Family History  Problem Relation Age of Onset  . Heart disease Mother   . Cancer Mother   . Diabetes Father   . Arthritis Unknown   . Anesthesia problems Neg Hx   . Hypotension Neg Hx   . Malignant hyperthermia Neg Hx   . Pseudochol deficiency Neg Hx     Prior to Admission medications   Medication Sig Start Date End Date Taking? Authorizing Provider  acyclovir (ZOVIRAX) 400 MG tablet Take 1 tablet (400 mg total) by mouth every morning. 02/18/17  Yes Twana First, MD  allopurinol (ZYLOPRIM) 300 MG tablet TAKE ONE TABLET BY MOUTH ONCE DAILY. 05/11/17  Yes Holley Bouche, NP  Artificial Tear Ointment (DRY EYES OP) Place 1 drop into both eyes as needed (dry eyes).   Yes [provider]  atorvastatin (LIPITOR)  80 MG tablet TAKE ONE TABLET BY MOUTH AT BEDTIME. 12/18/16  Yes Herminio Commons, MD  calcitRIOL (ROCALTROL) 0.25 MCG capsule Take 0.25 mcg by mouth 3 (three) times a week. Monday, Wednesday, Friday. 12/14/13  Yes [provider]  Calcium Carbonate (CALCIUM 600 PO) Take 1 tablet by mouth 2 (two) times daily.   Yes [provider]  Calcium Carbonate-Vit D-Min (CALCIUM 1200 PO) Take 1 tablet by mouth 2 (two) times daily.   Yes [provider]  dexlansoprazole (DEXILANT) 60 MG capsule Take 60 mg by mouth every morning.   Yes [provider]  dicyclomine (BENTYL) 20 MG tablet TAKE 1 TABLET BY MOUTH BEFORE MEALS 3 TIMES DAILY. 05/18/17  Yes Rehman, Mechele Dawley, MD  Eluxadoline (VIBERZI) 100 MG TABS Take 100 mg 2 (two) times daily with a meal by mouth.    Yes [provider]  gabapentin (NEURONTIN) 300 MG capsule Take 1 capsule (300 mg total) by  mouth 2 (two) times daily. 04/22/17  Yes Short, Noah Delaine, MD  insulin detemir (LEVEMIR) 100 UNIT/ML injection Inject 0.1 mLs (10 Units total) into the skin every morning. Patient taking differently: Inject 40 Units into the skin every morning.  04/22/17  Yes Short, Noah Delaine, MD  insulin lispro (HUMALOG) 100 UNIT/ML injection Inject 0-0.1 mLs (0-10 Units total) into the skin 2 (two) times daily with a meal. Sliding Scale per patient  CBG 150-200: 1 units CBG 201-275:  3 units CBG 275-350: 5 units 351-425: 7 units 425-500: 9 units 500+: 10 units 04/22/17  Yes Short, Mackenzie, MD  lisinopril (PRINIVIL,ZESTRIL) 5 MG tablet Take 5 mg by mouth every other day.  07/14/17  Yes [provider]  loratadine (CLARITIN) 10 MG tablet Take 10 mg by mouth every morning.    Yes [provider]  Magnesium Cl-Calcium Carbonate (SLOW-MAG PO) Take 1 tablet by mouth every morning.   Yes [provider]  metoprolol succinate (TOPROL-XL) 25 MG 24 hr tablet TAKE (1) TABLET BY MOUTH ONCE DAILY. 07/06/17  Yes Herminio Commons, MD  Multiple Vitamins-Minerals (MULTIVITAMINS THER. W/MINERALS) TABS Take 1 tablet by mouth daily.    Yes [provider]  nitroGLYCERIN (NITROSTAT) 0.4 MG SL tablet DISSOLVE 1 TABLET UNDER TONGUE EVERY 5 MINUTES UP TO 15 MIN FOR CHESTPAIN. IF NO RELIEF CALL 911. 05/11/17  Yes Holley Bouche, NP  omeprazole (PRILOSEC) 40 MG capsule Take 1 capsule (40 mg total) by mouth daily. 02/17/17  Yes Twana First, MD  potassium chloride SA (K-DUR,KLOR-CON) 20 MEQ tablet Take 2 tablets (40 mEq total) by mouth 2 (two) times daily. 02/27/17  Yes Twana First, MD  sulfamethoxazole-trimethoprim (BACTRIM DS,SEPTRA DS) 800-160 MG tablet TAKE ONE TABLET BY MOUTH EVERY MONDAY, WEDNESDAY, AND FRIDAY. 06/12/17  Yes Holley Bouche, NP  torsemide (DEMADEX) 20 MG tablet TAKE 2 TABLETS BY MOUTH EACH MORNING AND 1 TABLET EACH EVENING. Patient taking differently: TAKE 2 TABLETS BY  MOUTH EACH MORNING AND 1 TABLET EACH EVENING. *MAY TAKE ADDITIONAL TABLET IN THE EVENING IF NEEDED FOR FLUID RETENTION 04/22/16  Yes Setzer, Terri L, NP  Vitamin D, Ergocalciferol, (DRISDOL) 50000 units CAPS capsule TAKE 1 CAPSULE BY MOUTH EVERY THIRTY DAYS. 05/11/17  Yes Holley Bouche, NP  XARELTO 20 MG TABS tablet TAKE 1 TABLET BY MOUTH DAILY. Patient taking differently: TAKE 1 TABLET BY MOUTH EVERY DAY IN THE EVENING 09/02/16  Yes Herminio Commons, MD  colchicine 0.6 MG tablet Take 1 tablet (0.6 mg total) by mouth 2 (  two) times daily. Patient not taking: Reported on 07/30/2017 12/05/16   Tanna Furry, MD  dexamethasone (DECADRON) 4 MG tablet TAKE 5 TABLETS TWICE A DAY EVERY FRIDAY. Patient not taking: Reported on 07/16/2017 03/24/17   Holley Bouche, NP  lenalidomide ([name]) 5 MG capsule Take 1 capsule by mouth daily for 7 days, followed by 7 days off. Patient not taking: Reported on 07/16/2017 06/23/17   Jacquelin Hawking, NP  sulfamethoxazole-trimethoprim (BACTRIM DS,SEPTRA DS) 800-160 MG tablet TAKE ONE TABLET BY MOUTH EVERY MONDAY, WEDNESDAY, AND FRIDAY. Patient not taking: Reported on 07/30/2017 07/17/17   Holley Bouche, NP    Physical Exam: Vitals:   07/30/17 2145 07/30/17 2230 07/30/17 2315 07/30/17 2345  BP: 91/79 115/69 106/75 127/66  Pulse: 98 96 (!) 102   Resp: (!) 27 (!) 26 (!) 30 (!) 25  Temp:      TempSrc:      SpO2: 100% 94% 96%   Weight:      Height:        Constitutional: NAD, calm, comfortable; morbidly obese Vitals:   07/30/17 2145 07/30/17 2230 07/30/17 2315 07/30/17 2345  BP: 91/79 115/69 106/75 127/66  Pulse: 98 96 (!) 102   Resp: (!) 27 (!) 26 (!) 30 (!) 25  Temp:      TempSrc:      SpO2: 100% 94% 96%   Weight:      Height:       Eyes: lids and conjunctivae normal ENMT: Mucous membranes are moist.  Neck: normal, supple Respiratory: clear to auscultation bilaterally. Normal respiratory effort. No accessory muscle use.  Cardiovascular:  Regular rate and rhythm, no murmurs. No extremity edema. Has stockings on. Abdomen: no tenderness, no distention. Bowel sounds positive.  Musculoskeletal:  No joint deformity upper and lower extremities.   Skin: no rashes, lesions, ulcers.   Labs on Admission: I have personally reviewed following labs and imaging studies  CBC: Recent Labs  Lab 07/30/17 1723  WBC 9.2  NEUTROABS 8.1  HGB 9.4*  HCT 29.6*  MCV 96.7  PLT 026*   Basic Metabolic Panel: Recent Labs  Lab 07/30/17 1723  NA 132*  K 5.8*  CL 105  CO2 17*  GLUCOSE 97  BUN 52*  CREATININE 3.81*  CALCIUM 8.0*   GFR: Estimated Creatinine Clearance: 35.4 mL/min (A) (by C-G formula based on SCr of 3.81 mg/dL (H)). Liver Function Tests: Recent Labs  Lab 07/30/17 1723  AST 62*  ALT 26  ALKPHOS 48  BILITOT 0.6  PROT 5.8*  ALBUMIN 2.7*   No results for input(s): LIPASE, AMYLASE in the last 168 hours. No results for input(s): AMMONIA in the last 168 hours. Coagulation Profile: No results for input(s): INR, PROTIME in the last 168 hours. Cardiac Enzymes: No results for input(s): CKTOTAL, CKMB, CKMBINDEX, TROPONINI in the last 168 hours. BNP (last 3 results) No results for input(s): PROBNP in the last 8760 hours. HbA1C: No results for input(s): HGBA1C in the last 72 hours. CBG: No results for input(s): GLUCAP in the last 168 hours. Lipid Profile: No results for input(s): CHOL, HDL, LDLCALC, TRIG, CHOLHDL, LDLDIRECT in the last 72 hours. Thyroid Function Tests: No results for input(s): TSH, T4TOTAL, FREET4, T3FREE, THYROIDAB in the last 72 hours. Anemia Panel: No results for input(s): VITAMINB12, FOLATE, FERRITIN, TIBC, IRON, RETICCTPCT in the last 72 hours. Urine analysis:    Component Value Date/Time   COLORURINE YELLOW 07/30/2017 1710   APPEARANCEUR CLOUDY (A) 07/30/2017 1710   LABSPEC 1.012  07/30/2017 1710   PHURINE 5.0 07/30/2017 1710   GLUCOSEU NEGATIVE 07/30/2017 1710   HGBUR LARGE (A) 07/30/2017  1710   BILIRUBINUR NEGATIVE 07/30/2017 1710   KETONESUR NEGATIVE 07/30/2017 1710   PROTEINUR 100 (A) 07/30/2017 1710   UROBILINOGEN 0.2 10/03/2012 1809   NITRITE POSITIVE (A) 07/30/2017 1710   LEUKOCYTESUR LARGE (A) 07/30/2017 1710    Radiological Exams on Admission: Ct Abdomen Pelvis Wo Contrast  Result Date: 07/30/2017 CLINICAL DATA:  Fever shoulder pain weakness and fatigue history of multiple myeloma EXAM: CT CHEST, ABDOMEN AND PELVIS WITHOUT CONTRAST TECHNIQUE: Multidetector CT imaging of the chest, abdomen and pelvis was performed following the standard protocol without IV contrast. COMPARISON:  CT 04/14/2017, 02/25/2015 FINDINGS: CT CHEST FINDINGS Cardiovascular: Limited evaluation without intravenous contrast. Central venous catheter tip within the distal SVC. Nonaneurysmal aorta. Mild atherosclerosis. Extensive coronary artery calcification. Cardiomegaly. No significant pericardial effusion. Left diaphragmatic hernia containing colon and mesenteric fat. Mediastinum/Nodes: Midline trachea. No thyroid mass. Prominent lymph nodes in the upper mediastinum above the arch measuring up to 14 mm. Small hiatal hernia. Lungs/Pleura: Subsegmental atelectasis at the bases. No focal pulmonary infiltrate or effusion. No pneumothorax. Musculoskeletal: Degenerative changes. No acute or suspicious finding. Bulky calcified loose body at the right shoulder. CT ABDOMEN PELVIS FINDINGS Hepatobiliary: No focal hepatic abnormality or biliary dilatation. Small calcified stone in the gallbladder. Pancreas: Unremarkable. No pancreatic ductal dilatation or surrounding inflammatory changes. Spleen: Normal in size without focal abnormality. Adrenals/Urinary Tract: Adrenal glands are within normal limits. Considerable perinephric edema and soft tissue stranding. Right kidney appears enlarged. Mild right hydronephrosis and proximal hydroureter, secondary to a 3-4 mm stone in the proximal to mid right ureter at approximate  L4-L5 level. Air within the urinary bladder. Stomach/Bowel: Stomach is within normal limits. Appendix appears normal. No evidence of bowel wall thickening, distention, or inflammatory changes. Vascular/Lymphatic: Moderate aortic atherosclerosis. No aneurysmal dilatation. No significantly enlarged lymph nodes. Reproductive: Prostate is unremarkable. Other: Small fat in the right inguinal canal. Negative for free air or free fluid. Subcutaneous edema Musculoskeletal: Degenerative changes of the spine. No acute osseous abnormality. IMPRESSION: 1. Cardiomegaly. No focal pulmonary infiltrate. Left diaphragmatic hernia as before. 2. Considerable bilateral right greater than left perinephric edema. Right kidney is slightly enlarged. There is mild right hydronephrosis and hydroureter, secondary to a 3-4 mm stone in the proximal to mid right ureter. 3. Air within the urinary bladder, question any recent history of instrumentation 4. Small calcified stone in the gallbladder 5. There are a few enlarged mediastinal lymph nodes, nonspecific Electronically Signed   By: Donavan Foil M.D.   On: 07/30/2017 22:00   Ct Chest Wo Contrast  Result Date: 07/30/2017 CLINICAL DATA:  Fever shoulder pain weakness and fatigue history of multiple myeloma EXAM: CT CHEST, ABDOMEN AND PELVIS WITHOUT CONTRAST TECHNIQUE: Multidetector CT imaging of the chest, abdomen and pelvis was performed following the standard protocol without IV contrast. COMPARISON:  CT 04/14/2017, 02/25/2015 FINDINGS: CT CHEST FINDINGS Cardiovascular: Limited evaluation without intravenous contrast. Central venous catheter tip within the distal SVC. Nonaneurysmal aorta. Mild atherosclerosis. Extensive coronary artery calcification. Cardiomegaly. No significant pericardial effusion. Left diaphragmatic hernia containing colon and mesenteric fat. Mediastinum/Nodes: Midline trachea. No thyroid mass. Prominent lymph nodes in the upper mediastinum above the arch measuring up  to 14 mm. Small hiatal hernia. Lungs/Pleura: Subsegmental atelectasis at the bases. No focal pulmonary infiltrate or effusion. No pneumothorax. Musculoskeletal: Degenerative changes. No acute or suspicious finding. Bulky calcified loose body at the right shoulder. CT ABDOMEN  PELVIS FINDINGS Hepatobiliary: No focal hepatic abnormality or biliary dilatation. Small calcified stone in the gallbladder. Pancreas: Unremarkable. No pancreatic ductal dilatation or surrounding inflammatory changes. Spleen: Normal in size without focal abnormality. Adrenals/Urinary Tract: Adrenal glands are within normal limits. Considerable perinephric edema and soft tissue stranding. Right kidney appears enlarged. Mild right hydronephrosis and proximal hydroureter, secondary to a 3-4 mm stone in the proximal to mid right ureter at approximate L4-L5 level. Air within the urinary bladder. Stomach/Bowel: Stomach is within normal limits. Appendix appears normal. No evidence of bowel wall thickening, distention, or inflammatory changes. Vascular/Lymphatic: Moderate aortic atherosclerosis. No aneurysmal dilatation. No significantly enlarged lymph nodes. Reproductive: Prostate is unremarkable. Other: Small fat in the right inguinal canal. Negative for free air or free fluid. Subcutaneous edema Musculoskeletal: Degenerative changes of the spine. No acute osseous abnormality. IMPRESSION: 1. Cardiomegaly. No focal pulmonary infiltrate. Left diaphragmatic hernia as before. 2. Considerable bilateral right greater than left perinephric edema. Right kidney is slightly enlarged. There is mild right hydronephrosis and hydroureter, secondary to a 3-4 mm stone in the proximal to mid right ureter. 3. Air within the urinary bladder, question any recent history of instrumentation 4. Small calcified stone in the gallbladder 5. There are a few enlarged mediastinal lymph nodes, nonspecific Electronically Signed   By: Donavan Foil M.D.   On: 07/30/2017 22:00   Dg  Chest Port 1 View  Result Date: 07/30/2017 CLINICAL DATA:  Febrile. Shoulder pain, weakness and fatigue. History of multiple myeloma. EXAM: PORTABLE CHEST 1 VIEW COMPARISON:  Chest radiograph November 24, 2016 and CT abdomen and pelvis February 25, 2015 FINDINGS: Cardiac silhouette is moderately enlarged. Pulmonary vascular congestion. Rounded density projects in RIGHT lung base, with focal air density. No pneumothorax. Single lumen RIGHT chest Port-A-Cath distal tip projects in mid superior vena cava. Faint calcifications RIGHT neck are likely vascular. Loose body RIGHT shoulder. IMPRESSION: LEFT diaphragmatic hernia containing bowel. This could obscure LEFT lung base pneumonia or atelectasis. Moderate cardiomegaly and pulmonary vascular congestion. Electronically Signed   By: Elon Alas M.D.   On: 07/30/2017 18:22    Assessment/Plan Principal Problem:   Sepsis secondary to UTI [facility]) Active Problems:   Insulin dependent diabetes mellitus (HCC)   Morbid obesity (HCC)   Hypertension   Hyperlipidemia   Anemia, normocytic normochromic   Chronic systolic heart failure ([place])   AKI (acute kidney injury) ([place])    1. Sepsis secondary to UTI with complication of mild right hydroureter and hydronephrosis with proximal obstructing stone.  This appears to be in the setting of chronic steroid use for multiple myeloma, but the patient is on Bactrim.  Spoke with urology Dr. Alyson Ingles who would like patient to urgently transfer to [facility] for decompression with stent placement.  Continue antibiotics which I will switch to Vanco and Zosyn for now.  Follow repeat lactic acid levels in a.m.  Patient has received appropriate 30 cc/kg bolus with appropriate blood pressure response and lactic acid level is currently downtrending which is encouraging.  He will still require stepdown unit placement for close observation given the impending procedure.  Will give dose of hydrocortisone due to chronic steroid use  prior to procedure.  Follow urine and blood cultures ordered in ED. N.p.o. except medications. 2. Insulin-dependent diabetes.  Maintain on sliding scale insulin.  Currently not hyperglycemic. 3. AK I on CKD stage III.  Hold all nephrotoxic agents and continue with aggressive IV fluid; time-limited.  Much of this appears to be related to sepsis as  well as the hydronephrosis. Foley for strict I/O's. 4. Hyperkalemia secondary to above. Will order one dose Kayexelate now as well as Ca gluconate. Repeat labs in am. 5. Multiple myeloma.  Continue Bactrim.  Hydrocortisone stress dose 171m q8 hours as noted above prior to procedure for 3 doses. Patient appears to be recently off Dexamethasone. 6. History of atrial flutter on Xarelto.  Currently in SR. Hold Xarelto for the time being due to impending procedure and maintain on heparin drip for now. 7. Hypertension.  Hold blood pressure medications due to soft blood pressures.  Maintain on metoprolol due to atrial flutter. 8. Chronic systolic CHF with EF 329-03%on last echo 03/2016.  Monitor daily weights and I's and O's. Hold torsemide for now.  9. GERD.  Continue PPI. 10. Gout.  Hold allopurinol and colchicine for now.   DVT prophylaxis: Heparin drip per pharmacy Code Status: Full Family Communication: Brother at bedside Disposition Plan:To [facility] for Urology intervention Consults called:Urology Dr. MAlyson Inglesspoke with EDP Dr. ZRogene HoustonAdmission status: Inpatient, SDU   Pratik DDarleen CrockerDO [facility] Pager [phone] If 7PM-7AM, please contact night-coverage [email or web address] Password [email or web address]  07/31/2017, 12:14 AM

## 2017-07-31 NOTE — Op Note (Signed)
.  Preoperative diagnosis: right UPJ stone, sepsis  Postoperative diagnosis: Same  Procedure: 1 cystoscopy 2. right retrograde pyelography 3.  Intraoperative fluoroscopy, under one hour, with interpretation 4. right 6 x 26 JJ stent placement  Attending: Nicolette Bang  Anesthesia: General  Estimated blood loss: None  Drains: Right 6 x 26 JJ ureteral stent without tether, 16 French foley catheter  Specimens: none  Antibiotics: rocephin  Findings:right UPJ stone. Moderate hydronephrosis. No masses/lesions in the bladder. Ureteral orifices in normal anatomic location.  Indications: Patient is a 57 year old male with a history of right ureteralstone and concern for sepsis.  After discussing treatment options, they decided proceed with right stent placement.  Procedure her in detail: The patient was brought to the operating room and a brief timeout was done to ensure correct patient, correct procedure, correct site.  General anesthesia was administered patient was placed in dorsal lithotomy position.  Their genitalia was then prepped and draped in usual sterile fashion.  A rigid 40 French cystoscope was passed in the urethra and the bladder.  Bladder was inspected free masses or lesions.  the ureteral orifices were in the normal orthotopic locations.  a 6 french ureteral catheter was then instilled into the right ureteral orifice.  a gentle retrograde was obtained and findings noted above.  we then placed a zip wire through the ureteral catheter and advanced up to the renal pelvis.    We then placed a 6 x 26 double-j ureteral stent over the original zip wire.  We then removed the wire and good coil was noted in the the renal pelvis under fluoroscopy and the bladder under direct vision.  A foley catheter was then placed. the bladder was then drained and this concluded the procedure which was well tolerated by patient.  Complications: None  Condition: Stable, extubated, transferred to  PACU  Plan: Patient is to be admitted for IV antibiotics. He will have his stone extraction in 2 weeks.

## 2017-07-31 NOTE — Progress Notes (Signed)
CRITICAL VALUE ALERT  Critical Value: Left Antecubital GRAM NEGATIVE RODS AEB ANA   Date & Time Notied:  0708 07/31/2017  Provider Notified: Dr. Posey Pronto  Orders Received/Actions taken: Awaiting response

## 2017-07-31 NOTE — Progress Notes (Addendum)
ANTICOAGULATION CONSULT NOTE - Preliminary  Pharmacy Consult for heparin Indication: atrial fibrillation  No Known Allergies  Patient Measurements: Height: _0  (188 cm) Weight: (!) 380 lb (172.4 kg) IBW/kg (Calculated) : 82.2 HEPARIN DW (KG): 123.6   Vital Signs: Temp: 99.9 F (37.7 C) (02/28 2118) Temp Source: Oral (02/28 2118) BP: 114/58 (03/01 0030) Pulse Rate: 102 (02/28 2315)  Labs: Recent Labs    07/30/17 1723  HGB 9.4*  HCT 29.6*  PLT 133*  CREATININE 3.81*   Estimated Creatinine Clearance: 35.4 mL/min (A) (by C-G formula based on SCr of 3.81 mg/dL (H)).  Medical History: Past Medical History:  Diagnosis Date  . Anemia   . Arteriosclerotic cardiovascular disease (ASCVD)    MI-2000s; stent to the proximal LAD and diagonal in 2001; stress nuclear in 2008-impaired exercise capacity, left ventricular dilatation, moderately to severely depressed EF, apical, inferior and anteroseptal scar  . Arthritis   . Atrial flutter ([place])   . 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 Cellulitis of leg    both legs  . Chronic diarrhea   . Chronic kidney disease, stage 3, mod decreased GFR (HCC)    Creatinine of 1.84 in 06/2011 and 1.5 in 07/2011  . Diabetes mellitus    Insulin  . Dysrhythmia    AFlutter  . GERD (gastroesophageal reflux disease)   . Gout   . Hyperlipidemia   . Hypertension   . Injection site reaction   . Multiple myeloma 07/01/2011  . Myocardial infarction ([place]) 2000  . Obesity   . Pedal edema    Venous insufficiency  . Sleep apnea    uses cpap  . Ulcer     Medications:   (Not in a hospital admission) Scheduled:  . acyclovir  400 mg Oral q morning - 10a  . calcitRIOL  0.25 mcg Oral Once per day on Mon Wed Fri  . calcium carbonate  600 mg of elemental calcium Oral BID WC  . dicyclomine  20 mg Oral TID AC & HS  . Eluxadoline  100 mg Oral BID WC  . gabapentin  300 mg Oral BID  . hydrocortisone sod succinate (SOLU-CORTEF) inj  100 mg Intravenous Q8H   . insulin aspart  0-20 Units Subcutaneous Q4H  . insulin detemir  20 Units Subcutaneous q morning - 10a  . metoprolol succinate  25 mg Oral Daily  . multivitamin with minerals  1 tablet Oral Daily  . pantoprazole  40 mg Oral Daily  . sodium polystyrene  30 g Oral Once  . sulfamethoxazole-trimethoprim  1 tablet Oral Once per day on Mon Wed Fri   Infusions:  . sodium chloride    . calcium gluconate    . heparin    . piperacillin-tazobactam (ZOSYN)  IV    . sodium chloride    . vancomycin     PRN: acetaminophen **OR** acetaminophen, ondansetron **OR** ondansetron (ZOFRAN) IV Anti-infectives (From admission, onward)   Start     Dose/Rate Route Frequency Ordered Stop   07/31/17 1000  acyclovir (ZOVIRAX) tablet 400 mg     400 mg Oral  Every morning - 10a 07/31/17 0046     07/31/17 0900  sulfamethoxazole-trimethoprim (BACTRIM DS,SEPTRA DS) 800-160 MG per tablet 1 tablet     1 tablet Oral Once per day on Mon Wed Fri 07/31/17 0046     07/31/17 0115  piperacillin-tazobactam (ZOSYN) IVPB 3.375 g     3.375 g 12.5 mL/hr over 240 Minutes Intravenous  Once 07/31/17 0110  07/31/17 0115  vancomycin (VANCOCIN) 2,500 mg in sodium chloride 0.9 % 500 mL IVPB     2,500 mg 250 mL/hr over 120 Minutes Intravenous  Once 07/31/17 0110     07/30/17 1815  cefTRIAXone (ROCEPHIN) 2 g in sodium chloride 0.9 % 100 mL IVPB     2 g 200 mL/hr over 30 Minutes Intravenous  Once 07/30/17 1814 07/30/17 1913   07/30/17 1815  azithromycin (ZITHROMAX) 500 mg in sodium chloride 0.9 % 250 mL IVPB     500 mg 250 mL/hr over 60 Minutes Intravenous  Once 07/30/17 1814 07/30/17 2015      Assessment: 57 yo male with PMH CHF, CKD stage III, T2DM, multiple myeloma, a.fib on Xarelto, and morbid obesity.  Presented to ED with fever, confusion and weakness.  Dx sepsis secondary to UTI with right hydroureter and hydronephrosis with proximal obstructing stone.   Pt going to [facility] for urology to place stent.  Pharmacy asked to dose  heparin for a.fib.  Pt's last Xarelto dose was 2/27 at 2200.    Goal of Therapy:  Heparin level 0.3-0.7 units/ml  PTT 66-102s   Plan:  Obtain baseline PTT and heparin level Heparin infusion at 1900 units/hr.  No bolus due to Xarelto last taken 2/27 and going for surgery.  Adjust dose based on anti-Xa level and PTTs.    Dayanna Pryce, [place], [place] 07/31/2017,1:18 AM

## 2017-07-31 NOTE — Progress Notes (Signed)
Brief Pharmacy Note re: Heparin  O:  APTT= 97sec on heparin infusion at 1900 units/hr       No bleeding or infusion related issues reported by RN       Heparin infusion was not stopped for urology procedure   A/P Continue heparin infusion at 1900 units/hr        Daily aPTT, heparin level & CBC  Netta Cedars, PharmD, BCPS 07/31/2017@9 :14 PM

## 2017-07-31 NOTE — ED Notes (Signed)
Report to [place], RN @ [facility] and [place] with [facility], ETA 30-44m

## 2017-07-31 NOTE — Progress Notes (Signed)
ANTIBIOTIC CONSULT NOTE-Preliminary  Pharmacy Consult for vancomycin & zosyn Indication: sepsis  No Known Allergies  Patient Measurements: Height: '6\' 2"'  (188 cm) Weight: (!) 380 lb (172.4 kg) IBW/kg (Calculated) : 82.2 Adjusted Body Weight: 118 kg  Vital Signs: Temp: 99.4 F (37.4 C) (03/01 0135) Temp Source: Oral (03/01 0135) BP: 116/61 (03/01 0119) Pulse Rate: 102 (03/01 0120)  Labs: Recent Labs    07/30/17 1723  WBC 9.2  HGB 9.4*  PLT 133*  CREATININE 3.81*    Estimated Creatinine Clearance: 35.4 mL/min (A) (by C-G formula based on SCr of 3.81 mg/dL (H)).  No results for input(s): VANCOTROUGH, VANCOPEAK, VANCORANDOM, GENTTROUGH, GENTPEAK, GENTRANDOM, TOBRATROUGH, TOBRAPEAK, TOBRARND, AMIKACINPEAK, AMIKACINTROU, AMIKACIN in the last 72 hours.   Microbiology: Recent Results (from the past 720 hour(s))  C difficile quick screen w PCR reflex     Status: None   Collection Time: 07/24/17  4:00 PM  Result Value Ref Range Status   C Diff antigen NEGATIVE NEGATIVE Final   C Diff toxin NEGATIVE NEGATIVE Final   C Diff interpretation No C. difficile detected.  Final    Comment: Performed at [facility], [address], [place], [place] [address]  Culture, blood (Routine X 2) w Reflex to ID Panel     Status: None (Preliminary result)   Collection Time: 07/30/17  5:32 PM  Result Value Ref Range Status   Specimen Description LEFT ANTECUBITAL  Final   Special Requests   Final    BOTTLES DRAWN AEROBIC AND ANAEROBIC Blood Culture adequate volume Performed at [facility], [address] [place], [place] [address]    Culture PENDING  Incomplete   Report Status PENDING  Incomplete  Culture, blood (Routine X 2) w Reflex to ID Panel     Status: None (Preliminary result)   Collection Time: 07/30/17  5:32 PM  Result Value Ref Range Status   Specimen Description BLOOD LEFT ARM  Final   Special Requests   Final    BOTTLES DRAWN AEROBIC ONLY Blood Culture results may not be optimal  due to an inadequate volume of blood received in culture bottles Performed at [facility], [address], [place], [place] [address]    Culture PENDING  Incomplete   Report Status PENDING  Incomplete    Medical History: Past Medical History:  Diagnosis Date  . Anemia   . Arteriosclerotic cardiovascular disease (ASCVD)    MI-2000s; stent to the proximal LAD and diagonal in 2001; stress nuclear in 2008-impaired exercise capacity, left ventricular dilatation, moderately to severely depressed EF, apical, inferior and anteroseptal scar  . Arthritis   . Atrial flutter ([place])   . 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 Cellulitis of leg    both legs  . Chronic diarrhea   . Chronic kidney disease, stage 3, mod decreased GFR (HCC)    Creatinine of 1.84 in 06/2011 and 1.5 in 07/2011  . Diabetes mellitus    Insulin  . Dysrhythmia    AFlutter  . GERD (gastroesophageal reflux disease)   . Gout   . Hyperlipidemia   . Hypertension   . Injection site reaction   . Multiple myeloma 07/01/2011  . Myocardial infarction ([place]) 2000  . Obesity   . Pedal edema    Venous insufficiency  . Sleep apnea    uses cpap  . Ulcer     Medications:   (Not in a hospital admission) Scheduled:  . acyclovir  400 mg Oral q morning - 10a  . calcitRIOL  0.25 mcg Oral Once per day on Mon Wed Fri  . calcium carbonate  600 mg of elemental calcium Oral BID WC  . dicyclomine  20 mg Oral TID AC & HS  . Eluxadoline  100 mg Oral BID WC  . gabapentin  300 mg Oral BID  . hydrocortisone sod succinate (SOLU-CORTEF) inj  100 mg Intravenous Q8H  . insulin aspart  0-20 Units Subcutaneous Q4H  . insulin detemir  20 Units Subcutaneous q morning - 10a  . metoprolol succinate  25 mg Oral Daily  . multivitamin with minerals  1 tablet Oral Daily  . pantoprazole  40 mg Oral Daily  . sodium polystyrene  30 g Oral Once  . sulfamethoxazole-trimethoprim  1 tablet Oral Once per day on Mon Wed Fri   Infusions:  . sodium chloride     . calcium gluconate    . heparin    . piperacillin-tazobactam (ZOSYN)  IV    . sodium chloride    . vancomycin     PRN: acetaminophen **OR** acetaminophen, ondansetron **OR** ondansetron (ZOFRAN) IV Anti-infectives (From admission, onward)   Start     Dose/Rate Route Frequency Ordered Stop   07/31/17 1000  acyclovir (ZOVIRAX) tablet 400 mg     400 mg Oral  Every morning - 10a 07/31/17 0046     07/31/17 0900  sulfamethoxazole-trimethoprim (BACTRIM DS,SEPTRA DS) 800-160 MG per tablet 1 tablet     1 tablet Oral Once per day on Mon Wed Fri 07/31/17 0046     07/31/17 0115  piperacillin-tazobactam (ZOSYN) IVPB 3.375 g     3.375 g 12.5 mL/hr over 240 Minutes Intravenous  Once 07/31/17 0110     07/31/17 0115  vancomycin (VANCOCIN) 2,500 mg in sodium chloride 0.9 % 500 mL IVPB     2,500 mg 250 mL/hr over 120 Minutes Intravenous  Once 07/31/17 0110     07/30/17 1815  cefTRIAXone (ROCEPHIN) 2 g in sodium chloride 0.9 % 100 mL IVPB     2 g 200 mL/hr over 30 Minutes Intravenous  Once 07/30/17 1814 07/30/17 1913   07/30/17 1815  azithromycin (ZITHROMAX) 500 mg in sodium chloride 0.9 % 250 mL IVPB     500 mg 250 mL/hr over 60 Minutes Intravenous  Once 07/30/17 1814 07/30/17 2015      Assessment: 57 yo male with PMH CHF, CKD stage III, T2DM, multiple myeloma, a.fib on Xarelto, and morbid obesity.  Presented to ED with fever, confusion and weakness.  Dx sepsis secondary to UTI with right hydroureter and hydronephrosis with proximal obstructing stone.   Pt going to [facility] for urology to place stent. Pharmacy asked to dose vancomycin and zosyn.    Goal of Therapy:  Vancomycin trough level 15-20 mcg/ml  Plan:  Vancomycin loading dose 2519m x 1.  Will need maintenance dose if continued post-op.  Zosyn 3.375 grams IV Q8 hours  Thank you, Arijana Narayan, [place] RPH 07/31/2017,1:51 AM

## 2017-07-31 NOTE — Anesthesia Postprocedure Evaluation (Signed)
Anesthesia Post Note  Patient: [name]  Procedure(s) Performed: CYSTOSCOPY WITH RETROGRADE PYELOGRAM/URETERAL STENT PLACEMENT (Right Ureter)     Patient location during evaluation: PACU Anesthesia Type: General Level of consciousness: awake and alert Pain management: pain level controlled Vital Signs Assessment: post-procedure vital signs reviewed and stable Respiratory status: spontaneous breathing, nonlabored ventilation, respiratory function stable and patient connected to nasal cannula oxygen Cardiovascular status: blood pressure returned to baseline and stable Postop Assessment: no apparent nausea or vomiting Anesthetic complications: no    Last Vitals:  Vitals:   07/31/17 1800 07/31/17 1815  BP: 129/67 118/80  Pulse: 76 73  Resp: 16 (!) 22  Temp:    SpO2: 100% 100%    Last Pain:  Vitals:   07/31/17 1815  TempSrc:   PainSc: 0-No pain                 Margree Gimbel

## 2017-07-31 NOTE — Progress Notes (Signed)
[facility] PLAN OF CARE NOTE Patient: [name] FIE:[id number]   PCP: [name], MD DOB: [birth year]   DOA: 07/30/2017   DOS: 07/31/2017    Patient was admitted by my colleague Dr. Manuella Ghazi earlier on 07/31/2017. I have reviewed the H&P as well as assessment and plan and agree with the same. Important changes in the plan are listed below.  Plan of care: Principal Problem:   Sepsis secondary to UTI [facility]) Active Problems:   Insulin dependent diabetes mellitus ([place])   Morbid obesity ([place])   Hypertension   Hyperlipidemia   Anemia, normocytic normochromic   Chronic systolic heart failure ([place])   AKI (acute kidney injury) ([place])   Sepsis ([place]) Klebsiella bacteremia 2 out of 2. History of Enterobacter-pansensitive. Changing antibiotics to IV ceftriaxone, will continue IV vancomycin for at least 48 hours of culture clearance since Enterobacter is a slow growing organism. Urology will be performing stent placement this afternoon.  Okay with continuing heparin. Monitor for bleeding post procedure.  Monitor for hypoglycemia.   Acute kidney injury. Continue IV fluids, expecting significant improvement post procedure.  OSA. Continue CPAP nightly as well as as needed during the day during naps.  Author: Berle Mull, MD [facility] Pager: [phone] 07/31/2017 3:25 PM   If 7PM-7AM, please contact night-coverage at [email or web address], password [facility]

## 2017-08-01 ENCOUNTER — Encounter (HOSPITAL_COMMUNITY): Payer: Self-pay | Admitting: Urology

## 2017-08-01 LAB — BASIC METABOLIC PANEL
Anion gap: 9 (ref 5–15)
BUN: 57 mg/dL — ABNORMAL HIGH (ref 6–20)
CO2: 18 mmol/L — ABNORMAL LOW (ref 22–32)
Calcium: 6.9 mg/dL — ABNORMAL LOW (ref 8.9–10.3)
Chloride: 115 mmol/L — ABNORMAL HIGH (ref 101–111)
Creatinine, Ser: 2.87 mg/dL — ABNORMAL HIGH (ref 0.61–1.24)
GFR calc Af Amer: 26 mL/min — ABNORMAL LOW (ref >60)
GFR calc non Af Amer: 23 mL/min — ABNORMAL LOW (ref >60)
Glucose, Bld: 161 mg/dL — ABNORMAL HIGH (ref 65–99)
Potassium: 4.1 mmol/L (ref 3.5–5.1)
Sodium: 142 mmol/L (ref 135–145)

## 2017-08-01 LAB — CBC
HCT: 24.2 % — ABNORMAL LOW (ref 39.0–52.0)
HCT: 25.4 % — ABNORMAL LOW (ref 39.0–52.0)
HCT: 25.8 % — ABNORMAL LOW (ref 39.0–52.0)
Hemoglobin: 7.8 g/dL — ABNORMAL LOW (ref 13.0–17.0)
Hemoglobin: 8.3 g/dL — ABNORMAL LOW (ref 13.0–17.0)
Hemoglobin: 8.6 g/dL — ABNORMAL LOW (ref 13.0–17.0)
MCH: 30.8 pg (ref 26.0–34.0)
MCH: 30.9 pg (ref 26.0–34.0)
MCH: 31.4 pg (ref 26.0–34.0)
MCHC: 32.2 g/dL (ref 30.0–36.0)
MCHC: 32.7 g/dL (ref 30.0–36.0)
MCHC: 33.3 g/dL (ref 30.0–36.0)
MCV: 95.7 fL (ref 78.0–100.0)
MCV: 94.4 fL (ref 78.0–100.0)
MCV: 94.2 fL (ref 78.0–100.0)
Platelets: 134 10*3/uL — ABNORMAL LOW (ref 150–400)
Platelets: 149 10*3/uL — ABNORMAL LOW (ref 150–400)
Platelets: 101 10*3/uL — ABNORMAL LOW (ref 150–400)
RBC: 2.53 MIL/uL — ABNORMAL LOW (ref 4.22–5.81)
RBC: 2.69 MIL/uL — ABNORMAL LOW (ref 4.22–5.81)
RBC: 2.74 MIL/uL — ABNORMAL LOW (ref 4.22–5.81)
RDW: 16.9 % — ABNORMAL HIGH (ref 11.5–15.5)
RDW: 17.1 % — ABNORMAL HIGH (ref 11.5–15.5)
RDW: 16.3 % — ABNORMAL HIGH (ref 11.5–15.5)
WBC: 9.3 10*3/uL (ref 4.0–10.5)
WBC: 9.8 10*3/uL (ref 4.0–10.5)
WBC: 10.5 10*3/uL (ref 4.0–10.5)

## 2017-08-01 LAB — GLUCOSE, CAPILLARY
Glucose-Capillary: 142 mg/dL — ABNORMAL HIGH (ref 65–99)
Glucose-Capillary: 148 mg/dL — ABNORMAL HIGH (ref 65–99)
Glucose-Capillary: 161 mg/dL — ABNORMAL HIGH (ref 65–99)
Glucose-Capillary: 165 mg/dL — ABNORMAL HIGH (ref 65–99)
Glucose-Capillary: 222 mg/dL — ABNORMAL HIGH (ref 65–99)
Glucose-Capillary: 230 mg/dL — ABNORMAL HIGH (ref 65–99)

## 2017-08-01 LAB — APTT
aPTT: 77 seconds — ABNORMAL HIGH (ref 24–36)
aPTT: 73 seconds — ABNORMAL HIGH (ref 24–36)

## 2017-08-01 LAB — PROCALCITONIN: Procalcitonin: 127.4 ng/mL

## 2017-08-01 LAB — HEPARIN LEVEL (UNFRACTIONATED)
Heparin Unfractionated: 0.57 IU/mL (ref 0.30–0.70)
Heparin Unfractionated: 0.43 IU/mL (ref 0.30–0.70)

## 2017-08-01 LAB — MAGNESIUM: Magnesium: 2.1 mg/dL (ref 1.7–2.4)

## 2017-08-01 LAB — PREPARE RBC (CROSSMATCH)

## 2017-08-01 MED ORDER — GUAIFENESIN-DM 100-10 MG/5ML PO SYRP
5.0000 mL | ORAL_SOLUTION | ORAL | Status: DC | PRN
Start: 2017-08-01 — End: 2017-08-04
  Administered 2017-08-01 – 2017-08-02 (×3): 5 mL via ORAL
  Filled 2017-08-01 (×3): qty 10

## 2017-08-01 MED ORDER — SODIUM CHLORIDE 0.9 % IV SOLN
Freq: Once | INTRAVENOUS | Status: AC
  Administered 2017-08-01: 11:00:00 via INTRAVENOUS

## 2017-08-01 MED ORDER — DEXAMETHASONE 0.5 MG PO TABS
0.5000 mg | ORAL_TABLET | ORAL | Status: DC
  Administered 2017-08-02: 0.5 mg via ORAL
  Filled 2017-08-01: qty 1

## 2017-08-01 NOTE — Progress Notes (Signed)
ANTICOAGULATION CONSULT NOTE  - follow up  Pharmacy Consult for heparin while off rivaroxaban Indication: atrial fibrillation  No Known Allergies  Patient Measurements: Height: _0  (188 cm) Weight: (!) 392 lb 13.8 oz (178.2 kg) IBW/kg (Calculated) : 82.2 HEPARIN DW (KG): 125.4   Vital Signs: Temp: 98.1 F (36.7 C) (03/02 1555) Temp Source: Oral (03/02 1555) BP: 110/57 (03/02 1600) Pulse Rate: 53 (03/02 1600)  Labs: Recent Labs    07/30/17 1723  07/31/17 0445 07/31/17 1240 07/31/17 1939 08/01/17 0418 08/01/17 0810 08/01/17 1610 08/01/17 1617  HGB 9.4*  --  8.5*  --   --  7.8* 8.3* 8.6*  --   HCT 29.6*  --  26.3*  --   --  24.2* 25.4* 25.8*  --   PLT 133*  --  129*  --   --  134* 149* 101*  --   APTT  --    < > 51* 98* 97* 77*  --   --  73*  HEPARINUNFRC  --   --   --  1.46*  --  0.57  --  0.43  --   CREATININE 3.81*  --  3.29*  --   --  2.87*  --   --   --    < > = values in this interval not displayed.   Estimated Creatinine Clearance: 47.9 mL/min (A) (by C-G formula based on SCr of 2.87 mg/dL (H)).   Assessment: 57 yo male with PMH CHF, CKD stage III, T2DM, multiple myeloma, a.fib on Xarelto, and morbid obesity.  Presented to ED with fever, confusion and weakness.  Dx sepsis secondary to UTI with right hydroureter and hydronephrosis with proximal obstructing stone.   Pt going to [facility] for urology to place stent.  Pharmacy asked to dose heparin for a.fib.    - Pt's last Xarelto dose was 2/27 at 2200.   - Heparin started 3/1 at 0500 - baseline aPTT = 51 sec (elevated), unfortunately baseline anti-Xa level was d/c'd  Today, 08/01/2017  Heparin level and aPTT therapeutic now on current IV heparin rate of 1900 units/hr  CBC:  Stable  No evidence of bleeding  Goal of Therapy:  Heparin level 0.3-0.7 units/ml  PTT 66-102s   Plan:   Continue Heparin infusion at 1900 units/hr   Heparin level and aPTT appear to correlating now - will use only heparin level now to  monitor therapy  Daily heparin levels and CBC  Monitor for bleeding  Plan transition back to Xarelto tomorrow if renal function continues to improve   Peggyann Juba, PharmD, BCPS Pager: [phone] 08/01/2017 5:18 PM

## 2017-08-01 NOTE — Progress Notes (Addendum)
Urology Progress Note   1 Day Post-Op  Subjective: NAEON. Feels improved  Objective: Vital signs in last 24 hours: Temp:  [97.5 F (36.4 C)-98.6 F (37 C)] 97.5 F (36.4 C) (03/02 0755) Pulse Rate:  [73-99] 80 (03/01 2000) Resp:  [14-30] 19 (03/01 2000) BP: (114-142)/(63-101) 140/69 (03/01 2000) SpO2:  [92 %-100 %] 100 % (03/01 2000)   Physical Exam:  General: Alert and oriented CV: RRR Lungs: Clear Abdomen: Soft, appropriately tender. GU: Foley in place draining clear yellow urine  Ext: NT, No erythema  Lab Results: Recent Labs    07/30/17 1723 07/31/17 0445 08/01/17 0418  HGB 9.4* 8.5* 7.8*  HCT 29.6* 26.3* 24.2*   BMET Recent Labs    07/31/17 0445 08/01/17 0418  NA 141 142  K 5.0 4.1  CL 113* 115*  CO2 18* 18*  GLUCOSE 109* 161*  BUN 55* 57*  CREATININE 3.29* 2.87*  CALCIUM 7.5* 6.9*    Studies/Results: Ct Abdomen Pelvis Wo Contrast 07/30/2017  IMPRESSION: 1. Cardiomegaly. No focal pulmonary infiltrate. Left diaphragmatic hernia as before. 2. Considerable bilateral right greater than left perinephric edema. Right kidney is slightly enlarged. There is mild right hydronephrosis and hydroureter, secondary to a 3-4 mm stone in the proximal to mid right ureter. 3. Air within the urinary bladder, question any recent history of instrumentation 4. Small calcified stone in the gallbladder 5. There are a few enlarged mediastinal lymph nodes, nonspecific   Assessment/Plan:  57 y.o. male s/p right ureteral stent placement for an infected right UPJ stone on 07/31/17. Admitted to ICU post for close monitoring and IV antibiotics.   - Agree with thoughtful management of primary team.  - No further urologic intervention indicated in house. Will plan for stone extraction in 2 weeks. We will call the patient with a surgery date.  - Urology will continue to see prn  I interviewed the patient and reviewed his data.  I agree with the above assessment and plan.

## 2017-08-01 NOTE — Progress Notes (Signed)
ANTICOAGULATION CONSULT NOTE  - follow up  Pharmacy Consult for heparin while off rivaroxaban Indication: atrial fibrillation  No Known Allergies  Patient Measurements: Height: _0  (188 cm) Weight: (!) 392 lb 13.8 oz (178.2 kg) IBW/kg (Calculated) : 82.2 HEPARIN DW (KG): 125.4   Vital Signs: Temp: 97.5 F (36.4 C) (03/02 0755) Temp Source: Oral (03/02 0755) BP: 131/71 (03/02 1009) Pulse Rate: 62 (03/02 1009)  Labs: Recent Labs    07/30/17 1723  07/31/17 0445 07/31/17 1240 07/31/17 1939 08/01/17 0418 08/01/17 0810  HGB 9.4*  --  8.5*  --   --  7.8* 8.3*  HCT 29.6*  --  26.3*  --   --  24.2* 25.4*  PLT 133*  --  129*  --   --  134* 149*  APTT  --    < > 51* 98* 97* 77*  --   HEPARINUNFRC  --   --   --  1.46*  --  0.57  --   CREATININE 3.81*  --  3.29*  --   --  2.87*  --    < > = values in this interval not displayed.   Estimated Creatinine Clearance: 47.9 mL/min (A) (by C-G formula based on SCr of 2.87 mg/dL (H)).   Assessment: 57 yo male with PMH CHF, CKD stage III, T2DM, multiple myeloma, a.fib on Xarelto, and morbid obesity.  Presented to ED with fever, confusion and weakness.  Dx sepsis secondary to UTI with right hydroureter and hydronephrosis with proximal obstructing stone.   Pt going to [facility] for urology to place stent.  Pharmacy asked to dose heparin for a.fib.    - Pt's last Xarelto dose was 2/27 at 2200.   - Heparin started 3/1 at 0500 - baseline aPTT = 51 sec (elevated), unfortunately baseline anti-Xa level was d/c'd  Today, 08/01/2017  Heparin level and aPTT therapeutic now on current IV heparin rate of 1900 units/hr  CBC:  Stable  No evidence of bleeding  Goal of Therapy:  Heparin level 0.3-0.7 units/ml  PTT 66-102s   Plan:   Continue Heparin infusion at 1900 units/hr   Heparin level and aPTT appear to correlating now - will recheck both of them once more to confirm continued correlation  Daily heparin levels and CBC  Monitor for  bleeding   Adrian Saran, PharmD, BCPS Pager [phone] 08/01/2017 10:23 AM

## 2017-08-01 NOTE — Progress Notes (Signed)
[facility] Progress Note  Patient: [name] GNF:[id number]   PCP: [name], MD DOB: Jan 12, 1961   DOA: 07/30/2017   DOS: 08/01/2017   Date of Service: the patient was seen and examined on 08/01/2017  Subjective: Feeling better, no nausea no vomiting no abdominal pain.  No fever no chills no active bleeding.  No diarrhea.  Brief hospital course: Pt. with PMH of chronic systolic CHF, EF 84%, chronic kidney disease stage III, type II DM, multiple myeloma, chronic steroids, a flutter on Xarelto, morbid obesity on BiPAP; admitted on 07/30/2017, presented with complaint of fever and confusion, was found to have hydronephrosis with stone, sepsis due to Klebsiella. Currently further plan is continue current management.  Assessment and Plan: 1.  Sepsis Bacteremia. History of Enterobacter. Patient was started on broad-spectrum antibiotics, currently on IV ceftriaxone, sensitivities currently pending. Blood cultures positive for Klebsiella. We will recheck blood cultures tomorrow for clearance. Sepsis physiology currently resolved.  2.  Obstructive uropathy. Acute on chronic kidney disease stage III Presented with confusion and abdominal pain. CT scan was positive for hydronephrosis with renal stone. S/P right ureteral stent placement for infected right UPJ stone on 07/31/2017. No further urologic intervention planned. Continue IV hydration, renal function getting better, avoid nephrotoxic medication Will transition to Xarelto once renal function allows.  3.  Acute on chronic anemia. Likely acute anemia is from dilution as well as anemia of acute illness on top of anemia of chronic kidney disease. No active bleeding reported. Will transfuse 1 PRBC. Monitor.  4.  Secondary adrenal insufficiency. Patient is on chronic steroids and therefore has renal insufficiency.  Patient was treated with hydrocortisone perioperatively. Will be started him on low-dose Decadron and resume  Decadron starting Friday.  5.  Multiple myeloma. Chronic kidney disease. Anemia. Continue current management. Treatment is currently on hold due to recurrent infections.  6.  Acute metabolic encephalopathy. In the setting of sepsis and acute kidney injury. Getting better. Monitor.  7.  Chronic systolic CHF. Patient was given aggressive IV hydration as well as PRBC. Torsemide is currently on hold. Currently will hold fluids and monitor.  8.  Type 2 diabetes mellitus. Hopefully will resume home regimen starting tomorrow. Continue sliding scale as well as long-acting insulin.  9.  Gout. Hold allopurinol and colchicine.  10 GERD. Continue PPI.  11.  History of a flutter. On Xarelto at home chronically. Also on metoprolol. Continuing metoprolol but Xarelto was on hold due to renal function as well as pending procedure. And patient was transitioned to heparin. Renal function gets better tomorrow we would be able to resume Xarelto. Diet: Carb modified diet DVT Prophylaxis: on therapeutic anticoagulation.  Advance goals of care discussion: full code  Family Communication: no family was present at bedside, at the time of interview.  Disposition:  Discharge to home. transtion to tele  Consultants: urology Procedures: S/P right ureteral stent placement for infected right UPJ stone on 07/31/2017.  Antibiotics: Anti-infectives (From admission, onward)   Start     Dose/Rate Route Frequency Ordered Stop   08/01/17 2200  vancomycin (VANCOCIN) 1,750 mg in sodium chloride 0.9 % 500 mL IVPB  Status:  Discontinued     1,750 mg 250 mL/hr over 120 Minutes Intravenous Every 48 hours 07/31/17 0452 07/31/17 0843   08/01/17 2200  vancomycin (VANCOCIN) 2,000 mg in sodium chloride 0.9 % 500 mL IVPB  Status:  Discontinued     2,000 mg 250 mL/hr over 120 Minutes Intravenous Every 48 hours 07/31/17 0843  08/01/17 0858   07/31/17 1600  cefTRIAXone (ROCEPHIN) 2 g in sodium chloride 0.9 % 100 mL  IVPB     2 g 200 mL/hr over 30 Minutes Intravenous Every 24 hours 07/31/17 1214     07/31/17 1200  ceFEPIme (MAXIPIME) 1 g in sodium chloride 0.9 % 100 mL IVPB  Status:  Discontinued     1 g 200 mL/hr over 30 Minutes Intravenous Every 12 hours 07/31/17 0844 07/31/17 1214   07/31/17 1000  acyclovir (ZOVIRAX) tablet 400 mg     400 mg Oral  Every morning - 10a 07/31/17 0046     07/31/17 0900  sulfamethoxazole-trimethoprim (BACTRIM DS,SEPTRA DS) 800-160 MG per tablet 1 tablet  Status:  Discontinued     1 tablet Oral Once per day on Mon Wed Fri 07/31/17 0046 07/31/17 0805   07/31/17 0500  vancomycin (VANCOCIN) 2,000 mg in sodium chloride 0.9 % 500 mL IVPB     2,000 mg 250 mL/hr over 120 Minutes Intravenous  Once 07/31/17 0445 07/31/17 0721   07/31/17 0200  piperacillin-tazobactam (ZOSYN) IVPB 3.375 g  Status:  Discontinued     3.375 g 12.5 mL/hr over 240 Minutes Intravenous Every 8 hours 07/31/17 0156 07/31/17 0804   07/31/17 0115  piperacillin-tazobactam (ZOSYN) IVPB 3.375 g  Status:  Discontinued     3.375 g 12.5 mL/hr over 240 Minutes Intravenous  Once 07/31/17 0110 07/31/17 0156   07/31/17 0115  vancomycin (VANCOCIN) 2,500 mg in sodium chloride 0.9 % 500 mL IVPB  Status:  Discontinued     2,500 mg 250 mL/hr over 120 Minutes Intravenous  Once 07/31/17 0110 07/31/17 0444   07/30/17 1815  cefTRIAXone (ROCEPHIN) 2 g in sodium chloride 0.9 % 100 mL IVPB     2 g 200 mL/hr over 30 Minutes Intravenous  Once 07/30/17 1814 07/30/17 1913   07/30/17 1815  azithromycin (ZITHROMAX) 500 mg in sodium chloride 0.9 % 250 mL IVPB     500 mg 250 mL/hr over 60 Minutes Intravenous  Once 07/30/17 1814 07/30/17 2015       Objective: Physical Exam: Vitals:   08/01/17 1411 08/01/17 1555 08/01/17 1600 08/01/17 1722  BP: 122/71  (!) 110/57 106/67  Pulse: (!) 44  (!) 53 (!) 58  Resp: _0 Temp: 97.7 F (36.5 C) 98.1 F (36.7 C)  97.7 F (36.5 C)  TempSrc: Oral Oral  Oral  SpO2: 100%  99% 100%    Weight:      Height:        Intake/Output Summary (Last 24 hours) at 08/01/2017 1844 Last data filed at 08/01/2017 1634 Gross per 24 hour  Intake 1761.09 ml  Output 1150 ml  Net 611.09 ml   Filed Weights   07/30/17 1656 07/31/17 0414  Weight: (!) 172.4 kg (380 lb) (!) 178.2 kg (392 lb 13.8 oz)   General: Alert, Awake and Oriented to Time, Place and Person. Appear in mild distress, affect appropriate Eyes: PERRL, Conjunctiva normal ENT: Oral Mucosa clear moist. Neck: difficult to assess JVD, no Abnormal Mass Or lumps Cardiovascular: S1 and S2 Present, no Murmur, Peripheral Pulses Present Respiratory: normal respiratory effort, Bilateral Air entry equal and Decreased, no use of accessory muscle, Clear to Auscultation, no Crackles, no wheezes Abdomen: Bowel Sound present, Soft and no tenderness, no hernia Skin: no redness, no Rash, no induration Extremities: no Pedal edema, no calf tenderness Neurologic: Grossly no focal neuro deficit. Bilaterally Equal motor strength  Data Reviewed: CBC: Recent Labs  Lab 07/30/17 1723 07/31/17 0445 08/01/17 0418 08/01/17 0810 08/01/17 1610  WBC 9.2 8.6 9.3 9.8 10.5  NEUTROABS 8.1  --   --   --   --   HGB 9.4* 8.5* 7.8* 8.3* 8.6*  HCT 29.6* 26.3* 24.2* 25.4* 25.8*  MCV 96.7 95.6 95.7 94.4 94.2  PLT 133* 129* 134* 149* 546*   Basic Metabolic Panel: Recent Labs  Lab 07/30/17 1723 07/31/17 0445 08/01/17 0418  NA 132* 141 142  K 5.8* 5.0 4.1  CL 105 113* 115*  CO2 17* 18* 18*  GLUCOSE 97 109* 161*  BUN 52* 55* 57*  CREATININE 3.81* 3.29* 2.87*  CALCIUM 8.0* 7.5* 6.9*  MG  --   --  2.1    Liver Function Tests: Recent Labs  Lab 07/30/17 1723 07/31/17 0445  AST 62* 107*  ALT 26 33  ALKPHOS 48 51  BILITOT 0.6 0.9  PROT 5.8* 5.3*  ALBUMIN 2.7* 2.5*   No results for input(s): LIPASE, AMYLASE in the last 168 hours. No results for input(s): AMMONIA in the last 168 hours. Coagulation Profile: No results for input(s): INR,  PROTIME in the last 168 hours. Cardiac Enzymes: No results for input(s): CKTOTAL, CKMB, CKMBINDEX, TROPONINI in the last 168 hours. BNP (last 3 results) No results for input(s): PROBNP in the last 8760 hours. CBG: Recent Labs  Lab 07/31/17 2308 08/01/17 0319 08/01/17 0817 08/01/17 1130 08/01/17 1527  GLUCAP 153* 148* 142* 165* 161*   Studies: No results found.  Scheduled Meds: . acyclovir  400 mg Oral q morning - 10a  . calcitRIOL  0.25 mcg Oral Once per day on Mon Wed Fri  . calcium carbonate  3 tablet Oral BID WC  . [START ON 08/02/2017] dexamethasone  0.5 mg Oral QODAY  . dicyclomine  20 mg Oral TID AC & HS  . Eluxadoline  100 mg Oral BID WC  . gabapentin  300 mg Oral BID  . insulin aspart  0-20 Units Subcutaneous Q4H  . insulin detemir  10 Units Subcutaneous q morning - 10a  . metoprolol succinate  25 mg Oral Daily  . multivitamin with minerals  1 tablet Oral Daily  . pantoprazole  40 mg Oral Daily   Continuous Infusions: . sodium chloride 100 mL/hr at 08/01/17 1412  . cefTRIAXone (ROCEPHIN)  IV Stopped (08/01/17 1800)  . heparin 1,900 Units/hr (08/01/17 0000)   PRN Meds: acetaminophen **OR** acetaminophen, HYDROcodone-acetaminophen, ondansetron **OR** ondansetron (ZOFRAN) IV  Time spent: 35 minutes  Author: Berle Mull, MD [facility] Pager: [phone] 08/01/2017 6:44 PM  If 7PM-7AM, please contact night-coverage at [email or web address], password [facility]

## 2017-08-02 LAB — CBC
HCT: 25.5 % — ABNORMAL LOW (ref 39.0–52.0)
Hemoglobin: 8.5 g/dL — ABNORMAL LOW (ref 13.0–17.0)
MCH: 31.4 pg (ref 26.0–34.0)
MCHC: 33.3 g/dL (ref 30.0–36.0)
MCV: 94.1 fL (ref 78.0–100.0)
Platelets: 125 10*3/uL — ABNORMAL LOW (ref 150–400)
RBC: 2.71 MIL/uL — ABNORMAL LOW (ref 4.22–5.81)
RDW: 16.3 % — ABNORMAL HIGH (ref 11.5–15.5)
WBC: 13 10*3/uL — ABNORMAL HIGH (ref 4.0–10.5)

## 2017-08-02 LAB — CULTURE, BLOOD (ROUTINE X 2): Special Requests: ADEQUATE

## 2017-08-02 LAB — BASIC METABOLIC PANEL
Anion gap: 8 (ref 5–15)
BUN: 58 mg/dL — ABNORMAL HIGH (ref 6–20)
CO2: 19 mmol/L — ABNORMAL LOW (ref 22–32)
Calcium: 7 mg/dL — ABNORMAL LOW (ref 8.9–10.3)
Chloride: 110 mmol/L (ref 101–111)
Creatinine, Ser: 2.2 mg/dL — ABNORMAL HIGH (ref 0.61–1.24)
GFR calc Af Amer: 36 mL/min — ABNORMAL LOW (ref >60)
GFR calc non Af Amer: 31 mL/min — ABNORMAL LOW (ref >60)
Glucose, Bld: 186 mg/dL — ABNORMAL HIGH (ref 65–99)
Potassium: 4.1 mmol/L (ref 3.5–5.1)
Sodium: 137 mmol/L (ref 135–145)

## 2017-08-02 LAB — TYPE AND SCREEN
ABO/RH(D): O POS
Antibody Screen: NEGATIVE
Unit division: 0

## 2017-08-02 LAB — GLUCOSE, CAPILLARY
Glucose-Capillary: 163 mg/dL — ABNORMAL HIGH (ref 65–99)
Glucose-Capillary: 183 mg/dL — ABNORMAL HIGH (ref 65–99)
Glucose-Capillary: 183 mg/dL — ABNORMAL HIGH (ref 65–99)
Glucose-Capillary: 156 mg/dL — ABNORMAL HIGH (ref 65–99)
Glucose-Capillary: 205 mg/dL — ABNORMAL HIGH (ref 65–99)

## 2017-08-02 LAB — URINE CULTURE: Culture: 20000 — AB

## 2017-08-02 LAB — BPAM RBC
Blood Product Expiration Date: 201904012359
ISSUE DATE / TIME: 201903021054
Unit Type and Rh: 5100

## 2017-08-02 LAB — PROCALCITONIN: Procalcitonin: 50.91 ng/mL

## 2017-08-02 LAB — HEPARIN LEVEL (UNFRACTIONATED): Heparin Unfractionated: 0.22 IU/mL — ABNORMAL LOW (ref 0.30–0.70)

## 2017-08-02 MED ORDER — RIVAROXABAN 20 MG PO TABS
20.0000 mg | ORAL_TABLET | Freq: Every day | ORAL | Status: DC
  Administered 2017-08-02 – 2017-08-03 (×2): 20 mg via ORAL
  Filled 2017-08-02 (×2): qty 1

## 2017-08-02 NOTE — Progress Notes (Signed)
[facility] Progress Note  Patient: [name]:[id number]   PCP: [name], MD DOB: 1960-11-09   DOA: 07/30/2017   DOS: 08/02/2017   Date of Service: the patient was seen and examined on 08/02/2017  Subjective: Feeling better, still weak but less lethargic no diarrhea no constipation.  Brief hospital course: Pt. with PMH of chronic systolic CHF, EF 52%, chronic kidney disease stage III, type II DM, multiple myeloma, chronic steroids, a flutter on Xarelto, morbid obesity on BiPAP; admitted on 07/30/2017, presented with complaint of fever and confusion, was found to have hydronephrosis with stone, sepsis due to Klebsiella. Currently further plan is continue current management.  Assessment and Plan: 1.  Sepsis Klebsiella bacteremia. History of Enterobacter. Patient was started on broad-spectrum antibiotics, currently on IV ceftriaxone, pansensitive, continue ceftriaxone for now. Blood cultures positive for Klebsiella. We will recheck blood cultures for clearance. Sepsis physiology currently resolved.  2.  Obstructive uropathy. Acute on chronic kidney disease stage III Presented with confusion and abdominal pain. CT scan was positive for hydronephrosis with renal stone. S/P right ureteral stent placement for infected right UPJ stone on 07/31/2017. No further urologic intervention planned. Continue IV hydration, renal function getting better, avoid nephrotoxic medication Started on Xarelto.  3.  Acute on chronic anemia. Likely acute anemia is from dilution as well as anemia of acute illness on top of anemia of chronic kidney disease. No active bleeding reported. S/p transfuse 1 PRBC. Monitor.  4.  Secondary adrenal insufficiency. Patient is on chronic steroids and therefore has renal insufficiency.  Patient was treated with hydrocortisone perioperatively. Will be started him on low-dose Decadron and resume Decadron starting Friday.  5.  Multiple myeloma. Chronic kidney  disease. Anemia. Continue current management. Treatment is currently on hold due to recurrent infections.  6.  Acute metabolic encephalopathy. In the setting of sepsis and acute kidney injury. Getting better. Monitor.  7.  Chronic systolic CHF. Patient was given aggressive IV hydration as well as PRBC. Torsemide is currently on hold. Currently will hold fluids and monitor.  8.  Type 2 diabetes mellitus. Hopefully will resume home regimen starting tomorrow. Continue sliding scale as well as long-acting insulin.  9.  Gout. Hold allopurinol and colchicine.  10 GERD. Continue PPI.  11.  History of a flutter. On Xarelto at home chronically. Also on metoprolol. Continuing metoprolol but Xarelto was on hold due to renal function as well as pending procedure. And patient was transitioned to heparin.  resume Xarelto.  Diet: Carb modified diet DVT Prophylaxis: on therapeutic anticoagulation.  Advance goals of care discussion: full code  Family Communication: no family was present at bedside, at the time of interview.  Disposition:  Discharge to home. transtion to tele  Consultants: urology Procedures: S/P right ureteral stent placement for infected right UPJ stone on 07/31/2017.  Antibiotics: Anti-infectives (From admission, onward)   Start     Dose/Rate Route Frequency Ordered Stop   08/01/17 2200  vancomycin (VANCOCIN) 1,750 mg in sodium chloride 0.9 % 500 mL IVPB  Status:  Discontinued     1,750 mg 250 mL/hr over 120 Minutes Intravenous Every 48 hours 07/31/17 0452 07/31/17 0843   08/01/17 2200  vancomycin (VANCOCIN) 2,000 mg in sodium chloride 0.9 % 500 mL IVPB  Status:  Discontinued     2,000 mg 250 mL/hr over 120 Minutes Intravenous Every 48 hours 07/31/17 0843 08/01/17 0858   07/31/17 1600  cefTRIAXone (ROCEPHIN) 2 g in sodium chloride 0.9 % 100 mL IVPB  2 g 200 mL/hr over 30 Minutes Intravenous Every 24 hours 07/31/17 1214     07/31/17 1200  ceFEPIme (MAXIPIME)  1 g in sodium chloride 0.9 % 100 mL IVPB  Status:  Discontinued     1 g 200 mL/hr over 30 Minutes Intravenous Every 12 hours 07/31/17 0844 07/31/17 1214   07/31/17 1000  acyclovir (ZOVIRAX) tablet 400 mg     400 mg Oral  Every morning - 10a 07/31/17 0046     07/31/17 0900  sulfamethoxazole-trimethoprim (BACTRIM DS,SEPTRA DS) 800-160 MG per tablet 1 tablet  Status:  Discontinued     1 tablet Oral Once per day on Mon Wed Fri 07/31/17 0046 07/31/17 0805   07/31/17 0500  vancomycin (VANCOCIN) 2,000 mg in sodium chloride 0.9 % 500 mL IVPB     2,000 mg 250 mL/hr over 120 Minutes Intravenous  Once 07/31/17 0445 07/31/17 0721   07/31/17 0200  piperacillin-tazobactam (ZOSYN) IVPB 3.375 g  Status:  Discontinued     3.375 g 12.5 mL/hr over 240 Minutes Intravenous Every 8 hours 07/31/17 0156 07/31/17 0804   07/31/17 0115  piperacillin-tazobactam (ZOSYN) IVPB 3.375 g  Status:  Discontinued     3.375 g 12.5 mL/hr over 240 Minutes Intravenous  Once 07/31/17 0110 07/31/17 0156   07/31/17 0115  vancomycin (VANCOCIN) 2,500 mg in sodium chloride 0.9 % 500 mL IVPB  Status:  Discontinued     2,500 mg 250 mL/hr over 120 Minutes Intravenous  Once 07/31/17 0110 07/31/17 0444   07/30/17 1815  cefTRIAXone (ROCEPHIN) 2 g in sodium chloride 0.9 % 100 mL IVPB     2 g 200 mL/hr over 30 Minutes Intravenous  Once 07/30/17 1814 07/30/17 1913   07/30/17 1815  azithromycin (ZITHROMAX) 500 mg in sodium chloride 0.9 % 250 mL IVPB     500 mg 250 mL/hr over 60 Minutes Intravenous  Once 07/30/17 1814 07/30/17 2015       Objective: Physical Exam: Vitals:   08/01/17 2042 08/02/17 0440 08/02/17 0500 08/02/17 1220  BP: (!) 114/59 115/77  131/76  Pulse: (!) 50 (!) 50  (!) 56  Resp: '20 20  18  ' Temp: 98.1 F (36.7 C) 97.9 F (36.6 C)  98 F (36.7 C)  TempSrc: Oral Oral  Oral  SpO2: 99% 97%  100%  Weight:   (!) 180.3 kg (397 lb 7.8 oz)   Height:        Intake/Output Summary (Last 24 hours) at 08/02/2017 1742 Last data  filed at 08/02/2017 1500 Gross per 24 hour  Intake 1338 ml  Output 1075 ml  Net 263 ml   Filed Weights   07/30/17 1656 07/31/17 0414 08/02/17 0500  Weight: (!) 172.4 kg (380 lb) (!) 178.2 kg (392 lb 13.8 oz) (!) 180.3 kg (397 lb 7.8 oz)   General: Alert, Awake and Oriented to Time, Place and Person. Appear in mild distress, affect appropriate Eyes: PERRL, Conjunctiva normal ENT: Oral Mucosa clear moist. Neck: difficult to assess JVD, no Abnormal Mass Or lumps Cardiovascular: S1 and S2 Present, no Murmur, Peripheral Pulses Present Respiratory: normal respiratory effort, Bilateral Air entry equal and Decreased, no use of accessory muscle, Clear to Auscultation, no Crackles, no wheezes Abdomen: Bowel Sound present, Soft and no tenderness, no hernia Skin: no redness, no Rash, no induration Extremities: no Pedal edema, no calf tenderness Neurologic: Grossly no focal neuro deficit. Bilaterally Equal motor strength  Data Reviewed: CBC: Recent Labs  Lab 07/30/17 1723 07/31/17 0445 08/01/17 0418 08/01/17  7858 08/01/17 1610 08/02/17 0754  WBC 9.2 8.6 9.3 9.8 10.5 13.0*  NEUTROABS 8.1  --   --   --   --   --   HGB 9.4* 8.5* 7.8* 8.3* 8.6* 8.5*  HCT 29.6* 26.3* 24.2* 25.4* 25.8* 25.5*  MCV 96.7 95.6 95.7 94.4 94.2 94.1  PLT 133* 129* 134* 149* 101* 850*   Basic Metabolic Panel: Recent Labs  Lab 07/30/17 1723 07/31/17 0445 08/01/17 0418 08/02/17 0754  NA 132* 141 142 137  K 5.8* 5.0 4.1 4.1  CL 105 113* 115* 110  CO2 17* 18* 18* 19*  GLUCOSE 97 109* 161* 186*  BUN 52* 55* 57* 58*  CREATININE 3.81* 3.29* 2.87* 2.20*  CALCIUM 8.0* 7.5* 6.9* 7.0*  MG  --   --  2.1  --     Liver Function Tests: Recent Labs  Lab 07/30/17 1723 07/31/17 0445  AST 62* 107*  ALT 26 33  ALKPHOS 48 51  BILITOT 0.6 0.9  PROT 5.8* 5.3*  ALBUMIN 2.7* 2.5*   No results for input(s): LIPASE, AMYLASE in the last 168 hours. No results for input(s): AMMONIA in the last 168 hours. Coagulation  Profile: No results for input(s): INR, PROTIME in the last 168 hours. Cardiac Enzymes: No results for input(s): CKTOTAL, CKMB, CKMBINDEX, TROPONINI in the last 168 hours. BNP (last 3 results) No results for input(s): PROBNP in the last 8760 hours. CBG: Recent Labs  Lab 08/01/17 2356 08/02/17 0437 08/02/17 0821 08/02/17 1132 08/02/17 1604  GLUCAP 230* 163* 183* 183* 156*   Studies: No results found.  Scheduled Meds: . acyclovir  400 mg Oral q morning - 10a  . calcitRIOL  0.25 mcg Oral Once per day on Mon Wed Fri  . calcium carbonate  3 tablet Oral BID WC  . dexamethasone  0.5 mg Oral QODAY  . dicyclomine  20 mg Oral TID AC & HS  . Eluxadoline  100 mg Oral BID WC  . gabapentin  300 mg Oral BID  . insulin aspart  0-20 Units Subcutaneous Q4H  . insulin detemir  10 Units Subcutaneous q morning - 10a  . metoprolol succinate  25 mg Oral Daily  . multivitamin with minerals  1 tablet Oral Daily  . pantoprazole  40 mg Oral Daily  . rivaroxaban  20 mg Oral Q supper   Continuous Infusions: . sodium chloride 10 mL/hr at 08/01/17 1900  . cefTRIAXone (ROCEPHIN)  IV 2 g (08/02/17 1700)   PRN Meds: acetaminophen **OR** acetaminophen, guaiFENesin-dextromethorphan, HYDROcodone-acetaminophen, ondansetron **OR** ondansetron (ZOFRAN) IV  Time spent: 35 minutes  Author: Berle Mull, MD [facility] Pager: [phone] 08/02/2017 5:42 PM  If 7PM-7AM, please contact night-coverage at [email or web address], password [facility]

## 2017-08-02 NOTE — Progress Notes (Signed)
ANTICOAGULATION CONSULT NOTE  - follow up  Pharmacy Consult for Heparin >> Xarelto Indication: atrial fibrillation  No Known Allergies  Patient Measurements: Height: 6' 2" (188 cm) Weight: (!) 397 lb 7.8 oz (180.3 kg) IBW/kg (Calculated) : 82.2 HEPARIN DW (KG): 125.4   Vital Signs: Temp: 97.9 F (36.6 C) (03/03 0440) Temp Source: Oral (03/03 0440) BP: 115/77 (03/03 0440) Pulse Rate: 50 (03/03 0440)  Labs: Recent Labs    07/31/17 0445  07/31/17 1939 08/01/17 0418 08/01/17 0810 08/01/17 1610 08/01/17 1617 08/02/17 0754  HGB 8.5*  --   --  7.8* 8.3* 8.6*  --  8.5*  HCT 26.3*  --   --  24.2* 25.4* 25.8*  --  25.5*  PLT 129*  --   --  134* 149* 101*  --  125*  APTT 51*   < > 97* 77*  --   --  73*  --   HEPARINUNFRC  --    < >  --  0.57  --  0.43  --  0.22*  CREATININE 3.29*  --   --  2.87*  --   --   --  2.20*   < > = values in this interval not displayed.   Estimated Creatinine Clearance: 62.8 mL/min (A) (by C-G formula based on SCr of 2.2 mg/dL (H)).   Assessment: 57 yo male with PMH CHF, CKD stage III, T2DM, multiple myeloma, a.fib on Xarelto, and morbid obesity.  Presented to ED with fever, confusion and weakness.  Dx sepsis secondary to UTI with right hydroureter and hydronephrosis with proximal obstructing stone.   Pt going to [facility] for urology to place stent.  Pharmacy asked to dose heparin for a.fib.    - Pt's last Xarelto dose was 2/27 at 2200.   - Heparin started 3/1 at 0500 - baseline aPTT = 51 sec (elevated), unfortunately baseline anti-Xa level was d/c'd  Today, 08/02/2017  Heparin level subtherapeutic heparin rate of 1900 units/hr  CBC:  Low but stable  No evidence of bleeding  SCr improving daily, 2.2 today with CrCl ~63 ml/min  Goal of Therapy:  Heparin level 0.3-0.7 units/ml  PTT 66-102s   Plan:   Per MD orders yesterday, transition back to Xarelto today if SCr improved  DC heparin drip, then resume Xarelto 60m daily  No further dosage  adjustments anticipated, Pharmacy will sign off  EPeggyann Juba PharmD, BCPS Pager: [phone]/07/2017 9:24 AM

## 2017-08-03 ENCOUNTER — Encounter (HOSPITAL_COMMUNITY): Payer: Self-pay

## 2017-08-03 ENCOUNTER — Other Ambulatory Visit (HOSPITAL_COMMUNITY): Payer: Self-pay | Admitting: Adult Health

## 2017-08-03 LAB — CBC
HCT: 27.2 % — ABNORMAL LOW (ref 39.0–52.0)
Hemoglobin: 8.7 g/dL — ABNORMAL LOW (ref 13.0–17.0)
MCH: 30.4 pg (ref 26.0–34.0)
MCHC: 32 g/dL (ref 30.0–36.0)
MCV: 95.1 fL (ref 78.0–100.0)
Platelets: 139 10*3/uL — ABNORMAL LOW (ref 150–400)
RBC: 2.86 MIL/uL — ABNORMAL LOW (ref 4.22–5.81)
RDW: 16.4 % — ABNORMAL HIGH (ref 11.5–15.5)
WBC: 12.3 10*3/uL — ABNORMAL HIGH (ref 4.0–10.5)

## 2017-08-03 LAB — BASIC METABOLIC PANEL
Anion gap: 8 (ref 5–15)
BUN: 57 mg/dL — ABNORMAL HIGH (ref 6–20)
CO2: 19 mmol/L — ABNORMAL LOW (ref 22–32)
Calcium: 7.6 mg/dL — ABNORMAL LOW (ref 8.9–10.3)
Chloride: 113 mmol/L — ABNORMAL HIGH (ref 101–111)
Creatinine, Ser: 1.76 mg/dL — ABNORMAL HIGH (ref 0.61–1.24)
GFR calc Af Amer: 47 mL/min — ABNORMAL LOW (ref >60)
GFR calc non Af Amer: 41 mL/min — ABNORMAL LOW (ref >60)
Glucose, Bld: 160 mg/dL — ABNORMAL HIGH (ref 65–99)
Potassium: 4.1 mmol/L (ref 3.5–5.1)
Sodium: 140 mmol/L (ref 135–145)

## 2017-08-03 LAB — GLUCOSE, CAPILLARY
Glucose-Capillary: 142 mg/dL — ABNORMAL HIGH (ref 65–99)
Glucose-Capillary: 161 mg/dL — ABNORMAL HIGH (ref 65–99)
Glucose-Capillary: 178 mg/dL — ABNORMAL HIGH (ref 65–99)
Glucose-Capillary: 181 mg/dL — ABNORMAL HIGH (ref 65–99)
Glucose-Capillary: 188 mg/dL — ABNORMAL HIGH (ref 65–99)
Glucose-Capillary: 190 mg/dL — ABNORMAL HIGH (ref 65–99)
Glucose-Capillary: 194 mg/dL — ABNORMAL HIGH (ref 65–99)

## 2017-08-03 LAB — HEPARIN LEVEL (UNFRACTIONATED): Heparin Unfractionated: 1.26 IU/mL — ABNORMAL HIGH (ref 0.30–0.70)

## 2017-08-03 MED ORDER — CHLORHEXIDINE GLUCONATE CLOTH 2 % EX PADS
6.0000 | MEDICATED_PAD | Freq: Every day | CUTANEOUS | Status: DC
  Administered 2017-08-03 – 2017-08-04 (×2): 6 via TOPICAL

## 2017-08-03 MED ORDER — MUPIROCIN 2 % EX OINT
1.0000 "application " | TOPICAL_OINTMENT | Freq: Two times a day (BID) | CUTANEOUS | Status: DC
  Administered 2017-08-03 – 2017-08-04 (×3): 1 via NASAL
  Filled 2017-08-03: qty 22

## 2017-08-03 NOTE — Progress Notes (Signed)
[facility] Progress Note  Patient: [name] PXT:[id number]   PCP: [name], MD DOB: 30-Oct-1960   DOA: 07/30/2017   DOS: 08/03/2017   Date of Service: the patient was seen and examined on 08/03/2017  Subjective: Feeling better no nausea no vomiting no fever no chills.  No diarrhea.  Brief hospital course: Pt. with PMH of chronic systolic CHF, EF 62%, chronic kidney disease stage III, type II DM, multiple myeloma, chronic steroids, a flutter on Xarelto, morbid obesity on BiPAP; admitted on 07/30/2017, presented with complaint of fever and confusion, was found to have hydronephrosis with stone, sepsis due to Klebsiella. Currently further plan is continue current management.  Assessment and Plan: 1.  Sepsis Klebsiella bacteremia. History of Enterobacter. Patient was started on broad-spectrum antibiotics, currently on IV ceftriaxone, pansensitive, continue ceftriaxone for now.  Can transition to oral Augmentin tomorrow. Blood cultures positive for Klebsiella. We will recheck blood cultures for clearance. Sepsis physiology currently resolved.  2.  Obstructive uropathy. Acute on chronic kidney disease stage III Presented with confusion and abdominal pain. CT scan was positive for hydronephrosis with renal stone. S/P right ureteral stent placement for infected right UPJ stone on 07/31/2017. No further urologic intervention planned. Continue IV hydration, renal function getting better, avoid nephrotoxic medication Started on Xarelto.  3.  Acute on chronic anemia. Likely acute anemia is from dilution as well as anemia of acute illness on top of anemia of chronic kidney disease. No active bleeding reported. S/p transfuse 1 PRBC. Monitor.  4.  Secondary adrenal insufficiency. Patient is on chronic steroids and therefore has renal insufficiency.  Patient was treated with hydrocortisone perioperatively. Will be started him on low-dose Decadron and resume Decadron starting  Friday.  5.  Multiple myeloma. Chronic kidney disease. Anemia. Continue current management. Treatment is currently on hold due to recurrent infections.  6.  Acute metabolic encephalopathy. In the setting of sepsis and acute kidney injury. Getting better. Monitor.  7.  Chronic systolic CHF. Patient was given aggressive IV hydration as well as PRBC. Torsemide is currently on hold. Currently will hold fluids and monitor.  8.  Type 2 diabetes mellitus. Hopefully will resume home regimen starting tomorrow. Continue sliding scale as well as long-acting insulin.  9.  Gout. Hold allopurinol and colchicine.  10 GERD. Continue PPI.  11.  History of a flutter. On Xarelto at home chronically. Also on metoprolol. Continuing metoprolol but Xarelto was on hold due to renal function as well as pending procedure. And patient was transitioned to heparin.  resume Xarelto.  Diet: Carb modified diet DVT Prophylaxis: on therapeutic anticoagulation.  Advance goals of care discussion: full code  Family Communication: no family was present at bedside, at the time of interview.  Disposition:  Discharge to home. transtion to tele  Consultants: urology Procedures: S/P right ureteral stent placement for infected right UPJ stone on 07/31/2017.  Antibiotics: Anti-infectives (From admission, onward)   Start     Dose/Rate Route Frequency Ordered Stop   08/01/17 2200  vancomycin (VANCOCIN) 1,750 mg in sodium chloride 0.9 % 500 mL IVPB  Status:  Discontinued     1,750 mg 250 mL/hr over 120 Minutes Intravenous Every 48 hours 07/31/17 0452 07/31/17 0843   08/01/17 2200  vancomycin (VANCOCIN) 2,000 mg in sodium chloride 0.9 % 500 mL IVPB  Status:  Discontinued     2,000 mg 250 mL/hr over 120 Minutes Intravenous Every 48 hours 07/31/17 0843 08/01/17 0858   07/31/17 1600  cefTRIAXone (ROCEPHIN) 2 g  in sodium chloride 0.9 % 100 mL IVPB     2 g 200 mL/hr over 30 Minutes Intravenous Every 24 hours  07/31/17 1214     07/31/17 1200  ceFEPIme (MAXIPIME) 1 g in sodium chloride 0.9 % 100 mL IVPB  Status:  Discontinued     1 g 200 mL/hr over 30 Minutes Intravenous Every 12 hours 07/31/17 0844 07/31/17 1214   07/31/17 1000  acyclovir (ZOVIRAX) tablet 400 mg     400 mg Oral  Every morning - 10a 07/31/17 0046     07/31/17 0900  sulfamethoxazole-trimethoprim (BACTRIM DS,SEPTRA DS) 800-160 MG per tablet 1 tablet  Status:  Discontinued     1 tablet Oral Once per day on Mon Wed Fri 07/31/17 0046 07/31/17 0805   07/31/17 0500  vancomycin (VANCOCIN) 2,000 mg in sodium chloride 0.9 % 500 mL IVPB     2,000 mg 250 mL/hr over 120 Minutes Intravenous  Once 07/31/17 0445 07/31/17 0721   07/31/17 0200  piperacillin-tazobactam (ZOSYN) IVPB 3.375 g  Status:  Discontinued     3.375 g 12.5 mL/hr over 240 Minutes Intravenous Every 8 hours 07/31/17 0156 07/31/17 0804   07/31/17 0115  piperacillin-tazobactam (ZOSYN) IVPB 3.375 g  Status:  Discontinued     3.375 g 12.5 mL/hr over 240 Minutes Intravenous  Once 07/31/17 0110 07/31/17 0156   07/31/17 0115  vancomycin (VANCOCIN) 2,500 mg in sodium chloride 0.9 % 500 mL IVPB  Status:  Discontinued     2,500 mg 250 mL/hr over 120 Minutes Intravenous  Once 07/31/17 0110 07/31/17 0444   07/30/17 1815  cefTRIAXone (ROCEPHIN) 2 g in sodium chloride 0.9 % 100 mL IVPB     2 g 200 mL/hr over 30 Minutes Intravenous  Once 07/30/17 1814 07/30/17 1913   07/30/17 1815  azithromycin (ZITHROMAX) 500 mg in sodium chloride 0.9 % 250 mL IVPB     500 mg 250 mL/hr over 60 Minutes Intravenous  Once 07/30/17 1814 07/30/17 2015       Objective: Physical Exam: Vitals:   08/03/17 0405 08/03/17 0629 08/03/17 1337 08/03/17 2011  BP: 120/84  123/74 137/78  Pulse: 60  (!) 50 74  Resp: _0 Temp: 98.7 F (37.1 C)  98 F (36.7 C) 98.4 F (36.9 C)  TempSrc: Oral  Oral Oral  SpO2: 99%  100% 100%  Weight:  (!) 180.7 kg (398 lb 5.9 oz)    Height:        Intake/Output Summary  (Last 24 hours) at 08/03/2017 2055 Last data filed at 08/03/2017 2014 Gross per 24 hour  Intake 330.01 ml  Output 2975 ml  Net -2644.99 ml   Filed Weights   07/31/17 0414 08/02/17 0500 08/03/17 0629  Weight: (!) 178.2 kg (392 lb 13.8 oz) (!) 180.3 kg (397 lb 7.8 oz) (!) 180.7 kg (398 lb 5.9 oz)   General: Alert, Awake and Oriented to Time, Place and Person. Appear in mild distress, affect appropriate Eyes: PERRL, Conjunctiva normal ENT: Oral Mucosa clear moist. Neck: difficult to assess JVD, no Abnormal Mass Or lumps Cardiovascular: S1 and S2 Present, no Murmur, Peripheral Pulses Present Respiratory: normal respiratory effort, Bilateral Air entry equal and Decreased, no use of accessory muscle, Clear to Auscultation, no Crackles, no wheezes Abdomen: Bowel Sound present, Soft and no tenderness, no hernia Skin: no redness, no Rash, no induration Extremities: no Pedal edema, no calf tenderness Neurologic: Grossly no focal neuro deficit. Bilaterally Equal motor strength  Data Reviewed: CBC:  Recent Labs  Lab 07/30/17 1723  08/01/17 0418 08/01/17 0810 08/01/17 1610 08/02/17 0754 08/03/17 0531  WBC 9.2   < > 9.3 9.8 10.5 13.0* 12.3*  NEUTROABS 8.1  --   --   --   --   --   --   HGB 9.4*   < > 7.8* 8.3* 8.6* 8.5* 8.7*  HCT 29.6*   < > 24.2* 25.4* 25.8* 25.5* 27.2*  MCV 96.7   < > 95.7 94.4 94.2 94.1 95.1  PLT 133*   < > 134* 149* 101* 125* 139*   < > = values in this interval not displayed.   Basic Metabolic Panel: Recent Labs  Lab 07/30/17 1723 07/31/17 0445 08/01/17 0418 08/02/17 0754 08/03/17 0531  NA 132* 141 142 137 140  K 5.8* 5.0 4.1 4.1 4.1  CL 105 113* 115* 110 113*  CO2 17* 18* 18* 19* 19*  GLUCOSE 97 109* 161* 186* 160*  BUN 52* 55* 57* 58* 57*  CREATININE 3.81* 3.29* 2.87* 2.20* 1.76*  CALCIUM 8.0* 7.5* 6.9* 7.0* 7.6*  MG  --   --  2.1  --   --     Liver Function Tests: Recent Labs  Lab 07/30/17 1723 07/31/17 0445  AST 62* 107*  ALT 26 33  ALKPHOS 48  51  BILITOT 0.6 0.9  PROT 5.8* 5.3*  ALBUMIN 2.7* 2.5*   No results for input(s): LIPASE, AMYLASE in the last 168 hours. No results for input(s): AMMONIA in the last 168 hours. Coagulation Profile: No results for input(s): INR, PROTIME in the last 168 hours. Cardiac Enzymes: No results for input(s): CKTOTAL, CKMB, CKMBINDEX, TROPONINI in the last 168 hours. BNP (last 3 results) No results for input(s): PROBNP in the last 8760 hours. CBG: Recent Labs  Lab 08/03/17 0403 08/03/17 0719 08/03/17 1201 08/03/17 1607 08/03/17 2009  GLUCAP 181* 142* 178* 194* 188*   Studies: No results found.  Scheduled Meds: . acyclovir  400 mg Oral q morning - 10a  . calcitRIOL  0.25 mcg Oral Once per day on Mon Wed Fri  . calcium carbonate  3 tablet Oral BID WC  . Chlorhexidine Gluconate Cloth  6 each Topical Q0600  . dicyclomine  20 mg Oral TID AC & HS  . Eluxadoline  100 mg Oral BID WC  . gabapentin  300 mg Oral BID  . insulin aspart  0-20 Units Subcutaneous Q4H  . insulin detemir  10 Units Subcutaneous q morning - 10a  . multivitamin with minerals  1 tablet Oral Daily  . mupirocin ointment  1 application Nasal BID  . pantoprazole  40 mg Oral Daily  . rivaroxaban  20 mg Oral Q supper   Continuous Infusions: . cefTRIAXone (ROCEPHIN)  IV Stopped (08/03/17 1719)   PRN Meds: acetaminophen **OR** acetaminophen, guaiFENesin-dextromethorphan, HYDROcodone-acetaminophen, ondansetron **OR** ondansetron (ZOFRAN) IV  Time spent: 35 minutes  Author: Berle Mull, MD [facility] Pager: [phone] 08/03/2017 8:55 PM  If 7PM-7AM, please contact night-coverage at [email or web address], password [facility]

## 2017-08-03 NOTE — Evaluation (Signed)
Occupational Therapy Evaluation Patient Details Name: [name] MRN: [id number] DOB: 07-17-1960 Today's Date: 08/03/2017    History of Present Illness Pt. with PMH: systolic CHF, EF 65%, chronic kidney disease stage III, type II DM, multiple myeloma, chronic steroids, a flutter on Xarelto, morbid obesity on BiPAP; admitted on 07/30/2017, presented with complaint of fever and confusion, found to have hydronephrosis with stone   Clinical Impression   Pt admitted with fever and confusion. Pt currently with functional limitations due to the deficits listed below (see OT Problem List).  Pt will benefit from skilled OT to increase their safety and independence with ADL and functional mobility for ADL to facilitate discharge to venue listed below.      Follow Up Recommendations  Supervision/Assistance - 24 hour;No OT follow up    Equipment Recommendations  None recommended by OT    Recommendations for Other Services       Precautions / Restrictions Precautions Precautions: Fall Restrictions Weight Bearing Restrictions: No      Mobility Bed Mobility Overal bed mobility: Needs Assistance Bed Mobility: Supine to Sit     Supine to sit: Mod assist     General bed mobility comments: assisted to EOB by OT  Transfers Overall transfer level: Needs assistance Equipment used: Rolling walker (2 wheeled) Transfers: Sit to/from Stand Sit to Stand: +2 physical assistance;Min guard;From elevated surface;Min assist         General transfer comment: cues for hand placement, incr time, bed ht elevated    Balance Overall balance assessment: Needs assistance Sitting-balance support: Feet supported;No upper extremity supported Sitting balance-Leahy Scale: Fair       Standing balance-Leahy Scale: Poor Standing balance comment: reliant on UE support                           ADL either performed or assessed with clinical judgement   ADL Overall ADL's : Needs  assistance/impaired Eating/Feeding: Set up;Sitting   Grooming: Set up;Sitting   Upper Body Bathing: Minimal assistance;Sitting   Lower Body Bathing: Maximal assistance;Sit to/from stand;Cueing for safety;Cueing for sequencing   Upper Body Dressing : Minimal assistance;Sitting   Lower Body Dressing: Maximal assistance;Sit to/from stand;Cueing for safety;Cueing for sequencing       Toileting- Clothing Manipulation and Hygiene: Maximal assistance;Sit to/from stand;Cueing for safety;Cueing for sequencing               Vision Patient Visual Report: No change from baseline       Perception     Praxis      Pertinent Vitals/Pain Pain Assessment: Faces Pain Score: 2  Faces Pain Scale: Hurts a little bit Pain Location: scrotal region Pain Descriptors / Indicators: Discomfort;Sore Pain Intervention(s): Limited activity within patient's tolerance     Hand Dominance     Extremity/Trunk Assessment Upper Extremity Assessment Upper Extremity Assessment: Generalized weakness   Lower Extremity Assessment Lower Extremity Assessment: Generalized weakness       Communication Communication Communication: No difficulties   Cognition Arousal/Alertness: Awake/alert Behavior During Therapy: WFL for tasks assessed/performed Overall Cognitive Status: Within Functional Limits for tasks assessed                                                Home Living Family/patient expects to be discharged to:: Private residence Living Arrangements: Spouse/significant other Available  Help at Discharge: Family Type of Home: House Home Access: Stairs to enter [employer] of Steps: 7  to enter on 3rd level, first 4 steps has bilateral siderails, last 3 has no rails Entrance Stairs-Rails: Right;Left;Can reach both Home Layout: Multi-level Alternate Level Stairs-Number of Steps: 4 steps with bilateral siderails to 2nd level then 8 steps with siderail on right side to  basement   Bathroom Shower/Tub: Tub/shower unit   Bathroom Toilet: Handicapped height     Home Equipment: Cane - single point;Wheelchair - [employer] - 2 wheels          Prior Functioning/Environment Level of Independence: Independent with assistive device(s)        Comments: 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, 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        OT Problem List: Decreased strength;Decreased activity tolerance;Obesity;Decreased knowledge of use of DME or AE      OT Treatment/Interventions: Self-care/ADL training;Patient/family education;DME and/or AE instruction    OT Goals(Current goals can be found in the care plan section) Acute Rehab OT Goals Patient Stated Goal: home and get stronger OT Goal Formulation: With patient Time For Goal Achievement: 08/10/17 Potential to Achieve Goals: Good  OT Frequency: Min 2X/week   Barriers to D/C:            Co-evaluation PT/OT/SLP Co-Evaluation/Treatment: Yes Reason for Co-Treatment: For patient/therapist safety PT goals addressed during session: Mobility/safety with mobility;Proper use of DME OT goals addressed during session: ADL's and self-care      AM-PAC PT "6 Clicks" Daily Activity     Outcome Measure Help from another person eating meals?: None Help from another person taking care of personal grooming?: A Little Help from another person toileting, which includes using toliet, bedpan, or urinal?: A Lot Help from another person bathing (including washing, rinsing, drying)?: A Lot Help from another person to put on and taking off regular upper body clothing?: A Little Help from another person to put on and taking off regular lower body clothing?: A Lot 6 Click Score: 16   End of Session Equipment Utilized During Treatment: Rolling walker Nurse Communication: Mobility status  Activity Tolerance: Patient tolerated treatment well Patient left: in chair;with call bell/phone within reach  OT Visit  Diagnosis: Muscle weakness (generalized) (M62.81);Unsteadiness on feet (R26.81)                Time: 4799-8721 OT Time Calculation (min): 32 min Charges:  OT General Charges $OT Visit: 1 Visit OT Evaluation $OT Eval Moderate Complexity: 1 Mod G-Codes:     Kari Baars, [place]  Payton Mccallum D 08/03/2017, 2:00 PM

## 2017-08-03 NOTE — Progress Notes (Signed)
   08/03/17 1531  Clinical Encounter Type  Visited With Patient;Health care provider  Visit Type Initial  Referral From Nurse  Consult/Referral To Chaplain  Spiritual Encounters  Spiritual Needs Literature   Responded to a [facility] for information on an [place].  Patient welcomed in to provide the information.  We went over the form and I was able to answer his questions.  He stated he wants to discuss with his wife and will let us know when he wants to complete.  Patient indicated he has a good support network including his wife and brother.  Will follow and support as needed. Chaplain Katherene Ponto

## 2017-08-03 NOTE — Evaluation (Signed)
Physical Therapy Evaluation Patient Details Name: [name] MRN: [id number] DOB: Jul 20, 1960 Today's Date: 08/03/2017   History of Present Illness  Pt. with PMH: systolic CHF, EF 11%, chronic kidney disease stage III, type II DM, multiple myeloma, chronic steroids, a flutter on Xarelto, morbid obesity on BiPAP; admitted on 07/30/2017, presented with complaint of fever and confusion, found to have hydronephrosis with stone  Clinical Impression  Pt admitted with above diagnosis. Pt currently with functional limitations due to the deficits listed below (see PT Problem List). *Pt will benefit from skilled PT to increase their independence and safety with mobility to allow discharge to the venue listed below.  Will follow in acute setting, pt is motivated to work with therapy and will benefit from [place], pt is interested in [place] at [place] [facility] PT;Outpatient PT(vs)    Equipment Recommendations  None recommended by PT    Recommendations for Other Services       Precautions / Restrictions Precautions Precautions: Fall Restrictions Weight Bearing Restrictions: No      Mobility  Bed Mobility Overal bed mobility: Needs Assistance             General bed mobility comments: assisted to EOB by OT  Transfers Overall transfer level: Needs assistance Equipment used: Rolling walker (2 wheeled) Transfers: Sit to/from Stand Sit to Stand: +2 physical assistance;Min guard;From elevated surface;Min assist         General transfer comment: cues for hand placement, incr time, bed ht elevated  Ambulation/Gait Ambulation/Gait assistance: Min assist;Min guard Ambulation Distance (Feet): 3 Feet(steps forward and back) Assistive device: Rolling walker (2 wheeled) Gait Pattern/deviations: Step-to pattern;Wide base of support     General Gait Details: cues for RW position, posture  Stairs            Wheelchair Mobility    Modified  [place] (Stroke Patients Only)       Balance Overall balance assessment: Needs assistance Sitting-balance support: Feet supported;No upper extremity supported Sitting balance-Leahy Scale: Fair       Standing balance-Leahy Scale: Poor Standing balance comment: reliant on UE support                             Pertinent Vitals/Pain Pain Assessment: Faces Faces Pain Scale: Hurts a little bit Pain Location: scrotal region Pain Descriptors / Indicators: Discomfort;Sore Pain Intervention(s): Limited activity within patient's tolerance;Monitored during session    [place] expects to be discharged to:: Private residence Living Arrangements: Spouse/significant other Available Help at Discharge: Family Type of Home: House Home Access: Stairs to enter Entrance Stairs-Rails: Right;Left;Can reach both Entrance Stairs-Number of Steps: 7  to enter on 3rd level, first 4 steps has bilateral siderails, last 3 has no rails Home Layout: Multi-level Home Equipment: Cane - single point;Wheelchair - [employer] - 2 wheels      Prior Function Level of Independence: Independent with assistive device(s)         Comments: 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, 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     Hand Dominance        Extremity/Trunk Assessment   Upper Extremity Assessment Upper Extremity Assessment: Defer to OT evaluation    Lower Extremity Assessment Lower Extremity Assessment: Generalized weakness       Communication   Communication: No difficulties  Cognition Arousal/Alertness: Awake/alert Behavior During Therapy: WFL for tasks assessed/performed Overall  Cognitive Status: Within Functional Limits for tasks assessed                                        General Comments      Exercises     Assessment/Plan    PT Assessment Patient needs continued PT services  PT Problem List Decreased strength;Decreased activity  tolerance;Decreased mobility;Decreased knowledge of use of DME;Obesity       PT Treatment Interventions DME instruction;Gait training;Functional mobility training;Therapeutic activities;Therapeutic exercise;Patient/family education    PT Goals (Current goals can be found in the Care Plan section)  Acute Rehab PT Goals Patient Stated Goal: home and get stronger PT Goal Formulation: With patient Time For Goal Achievement: 08/17/17 Potential to Achieve Goals: Good    Frequency Min 3X/week   Barriers to discharge        Co-evaluation PT/OT/SLP Co-Evaluation/Treatment: Yes Reason for Co-Treatment: For patient/therapist safety PT goals addressed during session: Mobility/safety with mobility;Proper use of DME         AM-PAC PT "6 Clicks" Daily Activity  Outcome Measure Difficulty turning over in bed (including adjusting bedclothes, sheets and blankets)?: Unable Difficulty moving from lying on back to sitting on the side of the bed? : Unable Difficulty sitting down on and standing up from a chair with arms (e.g., wheelchair, bedside commode, etc,.)?: Unable Help needed moving to and from a bed to chair (including a wheelchair)?: A Little Help needed walking in hospital room?: A Lot Help needed climbing 3-5 steps with a railing? : A Lot 6 Click Score: 10    End of Session   Activity Tolerance: Patient tolerated treatment well;Patient limited by fatigue Patient left: in chair;with call bell/phone within reach;with nursing/sitter in room Nurse Communication: (RN present) PT Visit Diagnosis: Difficulty in walking, not elsewhere classified (R26.2)    Time: 1809-7044 PT Time Calculation (min) (ACUTE ONLY): 34 min   Charges:   PT Evaluation $PT Eval Low Complexity: 1 Low     PT G CodesKenyon Ana, PT Pager: [phone] 08/03/2017   [facility] 08/03/2017, 1:40 PM

## 2017-08-04 DIAGNOSIS — L8990 Pressure ulcer of unspecified site, unspecified stage: Secondary | ICD-10-CM

## 2017-08-04 LAB — GLUCOSE, CAPILLARY
Glucose-Capillary: 130 mg/dL — ABNORMAL HIGH (ref 65–99)
Glucose-Capillary: 131 mg/dL — ABNORMAL HIGH (ref 65–99)
Glucose-Capillary: 143 mg/dL — ABNORMAL HIGH (ref 65–99)

## 2017-08-04 LAB — BASIC METABOLIC PANEL
Anion gap: 8 (ref 5–15)
BUN: 49 mg/dL — ABNORMAL HIGH (ref 6–20)
CO2: 20 mmol/L — ABNORMAL LOW (ref 22–32)
Calcium: 8.3 mg/dL — ABNORMAL LOW (ref 8.9–10.3)
Chloride: 112 mmol/L — ABNORMAL HIGH (ref 101–111)
Creatinine, Ser: 1.56 mg/dL — ABNORMAL HIGH (ref 0.61–1.24)
GFR calc Af Amer: 55 mL/min — ABNORMAL LOW (ref >60)
GFR calc non Af Amer: 47 mL/min — ABNORMAL LOW (ref >60)
Glucose, Bld: 130 mg/dL — ABNORMAL HIGH (ref 65–99)
Potassium: 4 mmol/L (ref 3.5–5.1)
Sodium: 140 mmol/L (ref 135–145)

## 2017-08-04 LAB — CBC
HCT: 26.6 % — ABNORMAL LOW (ref 39.0–52.0)
Hemoglobin: 8.8 g/dL — ABNORMAL LOW (ref 13.0–17.0)
MCH: 31.4 pg (ref 26.0–34.0)
MCHC: 33.1 g/dL (ref 30.0–36.0)
MCV: 95 fL (ref 78.0–100.0)
Platelets: 146 10*3/uL — ABNORMAL LOW (ref 150–400)
RBC: 2.8 MIL/uL — ABNORMAL LOW (ref 4.22–5.81)
RDW: 16.2 % — ABNORMAL HIGH (ref 11.5–15.5)
WBC: 12.6 10*3/uL — ABNORMAL HIGH (ref 4.0–10.5)

## 2017-08-04 MED ORDER — SODIUM CHLORIDE 0.9% FLUSH
10.0000 mL | INTRAVENOUS | Status: DC | PRN
  Administered 2017-08-04: 10 mL
  Filled 2017-08-04: qty 40

## 2017-08-04 MED ORDER — POTASSIUM CHLORIDE CRYS ER 20 MEQ PO TBCR: 20.0000 meq | EXTENDED_RELEASE_TABLET | Freq: Every day | ORAL | 0 refills | Status: DC

## 2017-08-04 MED ORDER — METHOCARBAMOL 500 MG PO TABS: 500.0000 mg | ORAL_TABLET | Freq: Three times a day (TID) | ORAL | 0 refills | Status: DC | PRN

## 2017-08-04 MED ORDER — TORSEMIDE 20 MG PO TABS: 20.0000 mg | ORAL_TABLET | Freq: Every day | ORAL | 0 refills | Status: DC

## 2017-08-04 MED ORDER — AMOXICILLIN-POT CLAVULANATE 500-125 MG PO TABS: 1.0000 | ORAL_TABLET | Freq: Three times a day (TID) | ORAL | 0 refills | Status: AC

## 2017-08-04 MED ORDER — HEPARIN SOD (PORK) LOCK FLUSH 100 UNIT/ML IV SOLN
500.0000 [IU] | INTRAVENOUS | Status: AC | PRN
  Administered 2017-08-04: 500 [IU]

## 2017-08-04 MED ORDER — METHOCARBAMOL 500 MG PO TABS
500.0000 mg | ORAL_TABLET | Freq: Three times a day (TID) | ORAL | Status: DC
  Administered 2017-08-04: 500 mg via ORAL
  Filled 2017-08-04: qty 1

## 2017-08-04 MED ORDER — AMOXICILLIN-POT CLAVULANATE 500-125 MG PO TABS
1.0000 | ORAL_TABLET | Freq: Three times a day (TID) | ORAL | Status: DC
  Administered 2017-08-04: 500 mg via ORAL
  Filled 2017-08-04 (×3): qty 1

## 2017-08-04 NOTE — Progress Notes (Signed)
Pt to be discharged to home this afternoon. Pt and Pt's Wife given discharge teaching including medication changes and schedules. Pt and Wife verbalized understanding of all discharge  Teaching/instructions. Pt to be discharged with foley cathter and teaching regarding foley cathter care and emptying of foley cathter. Wife verbalized and able to return demonstration. Home Medication brought from Pharmacy and Returned to Pt's wife. Discharge Packet with Wife at time of discharge.Marland Kitchen

## 2017-08-04 NOTE — Discharge Summary (Signed)
[facility] Discharge Summary   Patient: [name] LNL:[id number]   PCP: [name], MD DOB: July 25, 1960   Date of admission: 07/30/2017   Date of discharge: 08/04/2017    Discharge Diagnoses:  Principal Problem:   Sepsis secondary to UTI [facility]) Active Problems:   Insulin dependent diabetes mellitus ([place])   Morbid obesity ([place])   Hypertension   Hyperlipidemia   Anemia, normocytic normochromic   Chronic systolic heart failure ([place])   AKI (acute kidney injury) ([place])   Sepsis ([place])   Pressure injury of skin   Admitted From: Home Disposition: Home with home health (patient did not want to go to nursing home) Support provided with home health on discharge.  Recommendations for Outpatient Follow-up:  1. PCP in 1 week. 2. Follow-up with urology in 1 week.  Will need voiding trial of the Foley catheter as well as discuss regarding eventual stone removal and stent removal.  Follow-up Information    [name], MD. Schedule an appointment as soon as possible for a visit in 1 week(s).   Specialty:  Family Medicine Contact information: [address] [place] [address] [phone]        Cleon Gustin, MD. Schedule an appointment as soon as possible for a visit in 1 week(s).   Specialty:  Urology Contact information: [address] [place] [address] [phone]        Home, [facility] Follow up.   Specialty:  Home Health Services Why:  [facility] physical therapy Contact information: [place] [address] [phone]          Diet recommendation: Cardiac diet carb modified  Activity: The patient is advised to gradually reintroduce usual activities.  Discharge Condition: good  Code Status: Full code  History of present illness: As per the H and P dictated on admission, "[name] is a 57 y.o. male with medical history significant for chronic systolic CHF with EF 86-76%, CKD stage III, type 2  diabetes-insulin-dependent, multiple myeloma on chronic steroid treatment, atrial flutter on Xarelto, and morbid obesity who was brought to the emergency department on account of some fever, confusion, and weakness that began 2 nights ago.  The patient was noted to have a temperature of 103.3 Fahrenheit on arrival to the ED and was initially somewhat hypotensive. He has had one episode of diarrhea, but no nausea or vomiting. Patient has also had a nonproductive cough as well as diffuse abdominal pain. Denies dysuria or scrotal pain or swelling.   ED Course: Patient started on multiple IV fluid bolus on account of sepsis with initial lactic acid of approximately 3.47 which has now subsequently improved to 1.36.  Initial blood pressure readings were in the 72C systolic range but have now stabilized with the latest blood pressure being 127/66.  He was started on IV azithromycin and Rocephin with some initial concern for pneumonia but is noted to have a UTI and imaging studies with CT chest and abdomen is remarkable for cardiomegaly with left diaphragmatic hernia as well as right greater than left perinephric edema with right-sided mild hydronephrosis and hydroureter.  Patient is also noted to have AK I with creatinine of 3.8 whereas baseline is typically 1.5-1.6."  Hospital Course:  Summary of his active problems in the hospital is as following. 1.  Sepsis Klebsiella bacteremia. History of Enterobacter. Patient was started on broad-spectrum antibiotics, then switched to IV ceftriaxone,  Blood culture grew Klebsiella pansensitive,  repeat cultures remain negative. Discussed with ID, transitioning to oral Augmentin for a total of 2 weeks treatment from initial improvement.  2.  Obstructive uropathy. Acute on chronic kidney disease stage III Acute metabolic encephalopathy Presented with confusion and abdominal pain. CT scan was positive for hydronephrosis with renal stone. S/P right ureteral stent  placement for infected right UPJ stone on 07/31/2017. No further urologic intervention planned. She was given IV hydration with improvement in renal function back to baseline. Patient will follow up with urology as an outpatient, will go home with Foley catheter per urology recommendation.  3.  Acute on chronic anemia. Likely acute anemia is from dilution as well as anemia of acute illness on top of anemia of chronic kidney disease. No active bleeding reported. S/p transfuse 1 PRBC. Monitor.  4.  Secondary adrenal insufficiency. Patient is on chronic steroids and therefore has renal insufficiency.  Patient was treated with hydrocortisone perioperatively. Resume home Decadron.  5.  Multiple myeloma. Chronic kidney disease. Anemia. Continue current management. Chemotherapy is currently on hold due to recurrent infections.  6.  Acute metabolic encephalopathy. In the setting of sepsis and acute kidney injury. Getting better. Monitor.  7.  Chronic systolic CHF. Patient was given aggressive IV hydration as well as PRBC. Torsemide was on hold in the hospital.  Will start with low-dose 20 mg daily and 20 mg as needed daily.  Patient's home regimen is a 60 mg daily. Recommend to follow-up with PCP in 1 week  8.  Type 2 diabetes mellitus. Resume home regimen.  9.  Gout. Resume allopurinol and discontinue colchicine.  10 GERD. Continue PPI.  11.  History of a flutter. On Xarelto at home chronically. Also on metoprolol. Continuing metoprolol but Xarelto was on hold due to renal function as well as pending procedure. And patient was transitioned to heparin.  resume Xarelto.  All other chronic medical condition were stable during the hospitalization.  Patient was seen by physical therapy, who recommended home health, she did not want to go to SNF, which was arranged by Education officer, museum and case Freight forwarder. On the day of the discharge the patient's vitals were stable, and no other  acute medical condition were reported by patient. the patient was felt safe to be discharge at home with home health.  Procedures and Results:  S/P right ureteral stent placement for infected right UPJ stone on 07/31/2017.  Consultations:  Urology  DISCHARGE MEDICATION: Allergies as of 08/04/2017   No Known Allergies     Medication List    STOP taking these medications   colchicine 0.6 MG tablet   lenalidomide 5 MG capsule Commonly known as:  [name]   lisinopril 5 MG tablet Commonly known as:  PRINIVIL,ZESTRIL   omeprazole 40 MG capsule Commonly known as:  PRILOSEC     TAKE these medications   acyclovir 400 MG tablet Commonly known as:  ZOVIRAX Take 1 tablet (400 mg total) by mouth every morning.   allopurinol 300 MG tablet Commonly known as:  ZYLOPRIM TAKE ONE TABLET BY MOUTH ONCE DAILY.   amoxicillin-clavulanate 500-125 MG tablet Commonly known as:  AUGMENTIN Take 1 tablet (500 mg total) by mouth 3 (three) times daily for 10 days.   atorvastatin 80 MG tablet Commonly known as:  LIPITOR TAKE ONE TABLET BY MOUTH AT BEDTIME.   calcitRIOL 0.25 MCG capsule Commonly known as:  ROCALTROL Take 0.25 mcg by mouth 3 (three) times a week. Monday, Wednesday, Friday.   CALCIUM 1200 PO Take 1  tablet by mouth 2 (two) times daily.   CALCIUM 600 PO Take 1 tablet by mouth 2 (two) times daily.   dexamethasone 4 MG tablet Commonly known as:  DECADRON TAKE 5 TABLETS TWICE A DAY EVERY FRIDAY.   DEXILANT 60 MG capsule Generic drug:  dexlansoprazole Take 60 mg by mouth every morning.   dicyclomine 20 MG tablet Commonly known as:  BENTYL TAKE 1 TABLET BY MOUTH BEFORE MEALS 3 TIMES DAILY.   DRY EYES OP Place 1 drop into both eyes as needed (dry eyes).   gabapentin 300 MG capsule Commonly known as:  NEURONTIN Take 1 capsule (300 mg total) by mouth 2 (two) times daily.   insulin detemir 100 UNIT/ML injection Commonly known as:  LEVEMIR Inject 0.1 mLs (10 Units total)  into the skin every morning. What changed:  how much to take   insulin lispro 100 UNIT/ML injection Commonly known as:  HUMALOG Inject 0-0.1 mLs (0-10 Units total) into the skin 2 (two) times daily with a meal. Sliding Scale per patient  CBG 150-200: 1 units CBG 201-275:  3 units CBG 275-350: 5 units 351-425: 7 units 425-500: 9 units 500+: 10 units   loratadine 10 MG tablet Commonly known as:  CLARITIN Take 10 mg by mouth every morning.   methocarbamol 500 MG tablet Commonly known as:  ROBAXIN Take 1 tablet (500 mg total) by mouth every 8 (eight) hours as needed for muscle spasms.   metoprolol succinate 25 MG 24 hr tablet Commonly known as:  TOPROL-XL TAKE (1) TABLET BY MOUTH ONCE DAILY.   multivitamins ther. w/minerals Tabs tablet Take 1 tablet by mouth daily.   nitroGLYCERIN 0.4 MG SL tablet Commonly known as:  NITROSTAT DISSOLVE 1 TABLET UNDER TONGUE EVERY 5 MINUTES UP TO 15 MIN FOR CHESTPAIN. IF NO RELIEF CALL 911.   potassium chloride SA 20 MEQ tablet Commonly known as:  K-DUR,KLOR-CON Take 1 tablet (20 mEq total) by mouth daily. Start taking on:  08/05/2017 What changed:    how much to take  when to take this   SLOW-MAG PO Take 1 tablet by mouth every morning.   sulfamethoxazole-trimethoprim 800-160 MG tablet Commonly known as:  BACTRIM DS,SEPTRA DS TAKE ONE TABLET BY MOUTH EVERY MONDAY, WEDNESDAY, AND FRIDAY.   torsemide 20 MG tablet Commonly known as:  DEMADEX Take 1 tablet (20 mg total) by mouth daily. Take extra 1 tab for weight gain of 3 lbs in 1 day or 5 lbs in 2 days Start taking on:  08/05/2017 What changed:  See the new instructions.   VIBERZI 100 MG Tabs Generic drug:  Eluxadoline Take 100 mg 2 (two) times daily with a meal by mouth.   Vitamin D (Ergocalciferol) 50000 units Caps capsule Commonly known as:  DRISDOL TAKE 1 CAPSULE BY MOUTH EVERY THIRTY DAYS.   XARELTO 20 MG Tabs tablet Generic drug:  rivaroxaban TAKE 1 TABLET BY MOUTH  DAILY. What changed:    how much to take  how to take this  when to take this      No Known Allergies Discharge Instructions    Diet - low sodium heart healthy   Complete by:  As directed    Discharge instructions   Complete by:  As directed    It is important that you read following instructions as well as go over your medication list with RN to help you understand your care after this hospitalization.  Discharge Instructions: Please follow-up with PCP in one week  Please  request your primary care physician to go over all Hospital Tests and Procedure/Radiological results at the follow up,  Please get all Hospital records sent to your PCP by signing hospital release before you go home.   Do not take more than prescribed Pain, Sleep and Anxiety Medications. You were cared for by a hospitalist during your hospital stay. If you have any questions about your discharge medications or the care you received while you were in the hospital after you are discharged, you can call the unit and ask to speak with the hospitalist on call if the hospitalist that took care of you is not available.  Once you are discharged, your primary care physician will handle any further medical issues. Please note that NO REFILLS for any discharge medications will be authorized once you are discharged, as it is imperative that you return to your primary care physician (or establish a relationship with a primary care physician if you do not have one) for your aftercare needs so that they can reassess your need for medications and monitor your lab values. You Must read complete instructions/literature along with all the possible adverse reactions/side effects for all the Medicines you take and that have been prescribed to you. Take any new Medicines after you have completely understood and accept all the possible adverse reactions/side effects. Wear Seat belts while driving. If you have smoked or chewed Tobacco in  the last 2 yrs please stop smoking and/or stop any Recreational drug use.   Increase activity slowly   Complete by:  As directed      Discharge Exam: Filed Weights   08/02/17 0500 08/03/17 0629 08/04/17 0458  Weight: (!) 180.3 kg (397 lb 7.8 oz) (!) 180.7 kg (398 lb 5.9 oz) (!) 171.5 kg (378 lb)   Vitals:   08/04/17 0454 08/04/17 0732  BP: 107/72 (!) 112/53  Pulse: 62 66  Resp: 20   Temp: 97.7 F (36.5 C)   SpO2: 95% 100%   General: Appear in no distress, no Rash; Oral Mucosa moist Cardiovascular: S1 and S2 Present, no Murmur, no JVD Respiratory: Bilateral Air entry present and Clear to Auscultation, no Crackles, no wheezes Abdomen: Bowel Sound present, Soft and no tenderness Extremities: no Pedal edema, no calf tenderness Neurology: Grossly no focal neuro deficit.  The results of significant diagnostics from this hospitalization (including imaging, microbiology, ancillary and laboratory) are listed below for reference.    Significant Diagnostic Studies: Ct Abdomen Pelvis Wo Contrast  Result Date: 07/30/2017 CLINICAL DATA:  Fever shoulder pain weakness and fatigue history of multiple myeloma EXAM: CT CHEST, ABDOMEN AND PELVIS WITHOUT CONTRAST TECHNIQUE: Multidetector CT imaging of the chest, abdomen and pelvis was performed following the standard protocol without IV contrast. COMPARISON:  CT 04/14/2017, 02/25/2015 FINDINGS: CT CHEST FINDINGS Cardiovascular: Limited evaluation without intravenous contrast. Central venous catheter tip within the distal SVC. Nonaneurysmal aorta. Mild atherosclerosis. Extensive coronary artery calcification. Cardiomegaly. No significant pericardial effusion. Left diaphragmatic hernia containing colon and mesenteric fat. Mediastinum/Nodes: Midline trachea. No thyroid mass. Prominent lymph nodes in the upper mediastinum above the arch measuring up to 14 mm. Small hiatal hernia. Lungs/Pleura: Subsegmental atelectasis at the bases. No focal pulmonary  infiltrate or effusion. No pneumothorax. Musculoskeletal: Degenerative changes. No acute or suspicious finding. Bulky calcified loose body at the right shoulder. CT ABDOMEN PELVIS FINDINGS Hepatobiliary: No focal hepatic abnormality or biliary dilatation. Small calcified stone in the gallbladder. Pancreas: Unremarkable. No pancreatic ductal dilatation or surrounding inflammatory changes. Spleen: Normal in size without  focal abnormality. Adrenals/Urinary Tract: Adrenal glands are within normal limits. Considerable perinephric edema and soft tissue stranding. Right kidney appears enlarged. Mild right hydronephrosis and proximal hydroureter, secondary to a 3-4 mm stone in the proximal to mid right ureter at approximate L4-L5 level. Air within the urinary bladder. Stomach/Bowel: Stomach is within normal limits. Appendix appears normal. No evidence of bowel wall thickening, distention, or inflammatory changes. Vascular/Lymphatic: Moderate aortic atherosclerosis. No aneurysmal dilatation. No significantly enlarged lymph nodes. Reproductive: Prostate is unremarkable. Other: Small fat in the right inguinal canal. Negative for free air or free fluid. Subcutaneous edema Musculoskeletal: Degenerative changes of the spine. No acute osseous abnormality. IMPRESSION: 1. Cardiomegaly. No focal pulmonary infiltrate. Left diaphragmatic hernia as before. 2. Considerable bilateral right greater than left perinephric edema. Right kidney is slightly enlarged. There is mild right hydronephrosis and hydroureter, secondary to a 3-4 mm stone in the proximal to mid right ureter. 3. Air within the urinary bladder, question any recent history of instrumentation 4. Small calcified stone in the gallbladder 5. There are a few enlarged mediastinal lymph nodes, nonspecific Electronically Signed   By: Donavan Foil M.D.   On: 07/30/2017 22:00   Ct Chest Wo Contrast  Result Date: 07/30/2017 CLINICAL DATA:  Fever shoulder pain weakness and fatigue  history of multiple myeloma EXAM: CT CHEST, ABDOMEN AND PELVIS WITHOUT CONTRAST TECHNIQUE: Multidetector CT imaging of the chest, abdomen and pelvis was performed following the standard protocol without IV contrast. COMPARISON:  CT 04/14/2017, 02/25/2015 FINDINGS: CT CHEST FINDINGS Cardiovascular: Limited evaluation without intravenous contrast. Central venous catheter tip within the distal SVC. Nonaneurysmal aorta. Mild atherosclerosis. Extensive coronary artery calcification. Cardiomegaly. No significant pericardial effusion. Left diaphragmatic hernia containing colon and mesenteric fat. Mediastinum/Nodes: Midline trachea. No thyroid mass. Prominent lymph nodes in the upper mediastinum above the arch measuring up to 14 mm. Small hiatal hernia. Lungs/Pleura: Subsegmental atelectasis at the bases. No focal pulmonary infiltrate or effusion. No pneumothorax. Musculoskeletal: Degenerative changes. No acute or suspicious finding. Bulky calcified loose body at the right shoulder. CT ABDOMEN PELVIS FINDINGS Hepatobiliary: No focal hepatic abnormality or biliary dilatation. Small calcified stone in the gallbladder. Pancreas: Unremarkable. No pancreatic ductal dilatation or surrounding inflammatory changes. Spleen: Normal in size without focal abnormality. Adrenals/Urinary Tract: Adrenal glands are within normal limits. Considerable perinephric edema and soft tissue stranding. Right kidney appears enlarged. Mild right hydronephrosis and proximal hydroureter, secondary to a 3-4 mm stone in the proximal to mid right ureter at approximate L4-L5 level. Air within the urinary bladder. Stomach/Bowel: Stomach is within normal limits. Appendix appears normal. No evidence of bowel wall thickening, distention, or inflammatory changes. Vascular/Lymphatic: Moderate aortic atherosclerosis. No aneurysmal dilatation. No significantly enlarged lymph nodes. Reproductive: Prostate is unremarkable. Other: Small fat in the right inguinal  canal. Negative for free air or free fluid. Subcutaneous edema Musculoskeletal: Degenerative changes of the spine. No acute osseous abnormality. IMPRESSION: 1. Cardiomegaly. No focal pulmonary infiltrate. Left diaphragmatic hernia as before. 2. Considerable bilateral right greater than left perinephric edema. Right kidney is slightly enlarged. There is mild right hydronephrosis and hydroureter, secondary to a 3-4 mm stone in the proximal to mid right ureter. 3. Air within the urinary bladder, question any recent history of instrumentation 4. Small calcified stone in the gallbladder 5. There are a few enlarged mediastinal lymph nodes, nonspecific Electronically Signed   By: Donavan Foil M.D.   On: 07/30/2017 22:00   Dg Chest Port 1 View  Result Date: 07/30/2017 CLINICAL DATA:  Febrile. Shoulder pain,  weakness and fatigue. History of multiple myeloma. EXAM: PORTABLE CHEST 1 VIEW COMPARISON:  Chest radiograph November 24, 2016 and CT abdomen and pelvis February 25, 2015 FINDINGS: Cardiac silhouette is moderately enlarged. Pulmonary vascular congestion. Rounded density projects in RIGHT lung base, with focal air density. No pneumothorax. Single lumen RIGHT chest Port-A-Cath distal tip projects in mid superior vena cava. Faint calcifications RIGHT neck are likely vascular. Loose body RIGHT shoulder. IMPRESSION: LEFT diaphragmatic hernia containing bowel. This could obscure LEFT lung base pneumonia or atelectasis. Moderate cardiomegaly and pulmonary vascular congestion. Electronically Signed   By: Elon Alas M.D.   On: 07/30/2017 18:22   Dg C-arm 1-60 Min-no Report  Result Date: 07/31/2017 Fluoroscopy was utilized by the requesting physician.  No radiographic interpretation.    Microbiology: Recent Results (from the past 240 hour(s))  Culture, blood (Routine X 2) w Reflex to ID Panel     Status: Abnormal   Collection Time: 07/30/17  5:32 PM  Result Value Ref Range Status   Specimen Description   Final     LEFT ANTECUBITAL Performed at [facility], [address] [place], [place] [address]    Special Requests   Final    BOTTLES DRAWN AEROBIC AND ANAEROBIC Blood Culture adequate volume Performed at [facility], [address], [place], [place] [address]    Culture  Setup Time   Final    GRAM NEGATIVE RODS Gram Stain Report Called to,Read Back By and Verified With: HEAZNER @ [id number] ON [id number] BY HENDERSON L. IN BOTH AEROBIC AND ANAEROBIC BOTTLES IDENTIFICATION TO FOLLOW    Culture (A)  Final    KLEBSIELLA PNEUMONIAE SUSCEPTIBILITIES PERFORMED ON PREVIOUS CULTURE WITHIN THE LAST 5 DAYS. Performed at [facility], [place] [address] [place], [place] [address]    Report Status 08/02/2017 FINAL  Final  Culture, blood (Routine X 2) w Reflex to ID Panel     Status: Abnormal   Collection Time: 07/30/17  5:32 PM  Result Value Ref Range Status   Specimen Description   Final    BLOOD LEFT ARM Performed at [facility], [address] [place], [place] [address]    Special Requests   Final    BOTTLES DRAWN AEROBIC ONLY Blood Culture results may not be optimal due to an inadequate volume of blood received in culture bottles Performed at [facility], [address], [place],  [address]    Culture  Setup Time   Final    GRAM NEGATIVE RODS Gram Stain Report Called to,Read Back By and Verified With: MCNABB @ 0740 ON [id number] BY HENDERSON L. AEROBIC BOTTLE ONLY    Culture KLEBSIELLA PNEUMONIAE (A)  Final   Report Status 08/02/2017 FINAL  Final   Organism ID, Bacteria KLEBSIELLA PNEUMONIAE  Final      Susceptibility   Klebsiella pneumoniae - MIC*    AMPICILLIN RESISTANT Resistant     CEFAZOLIN <=4 SENSITIVE Sensitive     CEFEPIME <=1 SENSITIVE Sensitive     CEFTAZIDIME <=1 SENSITIVE Sensitive     CEFTRIAXONE <=1 SENSITIVE Sensitive     CIPROFLOXACIN 0.5 SENSITIVE Sensitive     GENTAMICIN <=1 SENSITIVE Sensitive     IMIPENEM <=0.25 SENSITIVE Sensitive     TRIMETH/SULFA >=320  RESISTANT Resistant     AMPICILLIN/SULBACTAM 4 SENSITIVE Sensitive     PIP/TAZO <=4 SENSITIVE Sensitive     Extended ESBL NEGATIVE Sensitive     * KLEBSIELLA PNEUMONIAE  Blood Culture ID Panel (Reflexed)  Status: Abnormal   Collection Time: 07/30/17  5:32 PM  Result Value Ref Range Status   Enterococcus species NOT DETECTED NOT DETECTED Final   Listeria monocytogenes NOT DETECTED NOT DETECTED Final   Staphylococcus species NOT DETECTED NOT DETECTED Final   Staphylococcus aureus NOT DETECTED NOT DETECTED Final   Streptococcus species NOT DETECTED NOT DETECTED Final   Streptococcus agalactiae NOT DETECTED NOT DETECTED Final   Streptococcus pneumoniae NOT DETECTED NOT DETECTED Final   Streptococcus pyogenes NOT DETECTED NOT DETECTED Final   Acinetobacter baumannii NOT DETECTED NOT DETECTED Final   Enterobacteriaceae species DETECTED (A) NOT DETECTED Final    Comment: Enterobacteriaceae represent a large family of gram-negative bacteria, not a single organism. CRITICAL RESULT CALLED TO, READ BACK BY AND VERIFIED WITH: C. Shadr Pharm.D. 11:25 07/31/17 ([email or web address])    Enterobacter cloacae complex NOT DETECTED NOT DETECTED Final   Escherichia coli NOT DETECTED NOT DETECTED Final   Klebsiella oxytoca NOT DETECTED NOT DETECTED Final   Klebsiella pneumoniae DETECTED (A) NOT DETECTED Final    Comment: CRITICAL RESULT CALLED TO, READ BACK BY AND VERIFIED WITH: C. Shade Pharm.D. 11:25 07/31/17 ([email or web address])    Proteus species NOT DETECTED NOT DETECTED Final   Serratia marcescens NOT DETECTED NOT DETECTED Final   Carbapenem resistance NOT DETECTED NOT DETECTED Final   Haemophilus influenzae NOT DETECTED NOT DETECTED Final   Neisseria meningitidis NOT DETECTED NOT DETECTED Final   Pseudomonas aeruginosa NOT DETECTED NOT DETECTED Final   Candida albicans NOT DETECTED NOT DETECTED Final   Candida glabrata NOT DETECTED NOT DETECTED Final   Candida krusei NOT DETECTED NOT DETECTED Final   Candida  parapsilosis NOT DETECTED NOT DETECTED Final   Candida tropicalis NOT DETECTED NOT DETECTED Final  Urine Culture     Status: Abnormal   Collection Time: 07/30/17  5:51 PM  Result Value Ref Range Status   Specimen Description   Final    URINE, CLEAN CATCH Performed at [facility], [address], [place], [place] [address]    Special Requests   Final    NONE Performed at [facility], [address], [place], [place] [address]    Culture 20,000 COLONIES/mL KLEBSIELLA PNEUMONIAE (A)  Final   Report Status 08/02/2017 FINAL  Final   Organism ID, Bacteria KLEBSIELLA PNEUMONIAE (A)  Final      Susceptibility   Klebsiella pneumoniae - MIC*    AMPICILLIN >=32 RESISTANT Resistant     CEFAZOLIN <=4 SENSITIVE Sensitive     CEFTRIAXONE <=1 SENSITIVE Sensitive     CIPROFLOXACIN 0.5 SENSITIVE Sensitive     GENTAMICIN <=1 SENSITIVE Sensitive     IMIPENEM <=0.25 SENSITIVE Sensitive     NITROFURANTOIN 64 INTERMEDIATE Intermediate     TRIMETH/SULFA >=320 RESISTANT Resistant     AMPICILLIN/SULBACTAM 4 SENSITIVE Sensitive     PIP/TAZO <=4 SENSITIVE Sensitive     Extended ESBL NEGATIVE Sensitive     * 20,000 COLONIES/mL KLEBSIELLA PNEUMONIAE  MRSA PCR Screening     Status: Abnormal   Collection Time: 07/31/17  4:10 AM  Result Value Ref Range Status   MRSA by PCR POSITIVE (A) NEGATIVE Final    Comment:        The GeneXpert MRSA Assay (FDA approved for NASAL specimens only), is one component of a comprehensive MRSA colonization surveillance program. It is not intended to diagnose MRSA infection nor to guide or monitor treatment for MRSA infections. RESULT CALLED TO, READ BACK BY AND VERIFIED WITH: MCNABB,B @ [place] ON [id number] BY  POTEAT,S Performed at [facility] [place] [address] [place], [place] [address]   Culture, blood (routine x 2)     Status: None (Preliminary result)   Collection Time: 08/02/17 12:11 PM  Result Value Ref Range Status   Specimen Description   Final     BLOOD LEFT ANTECUBITAL Performed at [facility] [address], [place], [place] [address]    Special Requests   Final    BOTTLES DRAWN AEROBIC AND ANAEROBIC Blood Culture adequate volume Performed at [facility] [address], [place], [place] [address]    Culture   Final    NO GROWTH 2 DAYS Performed at [facility] [address], [place], [place] [address]    Report Status PENDING  Incomplete  Culture, blood (routine x 2)     Status: None (Preliminary result)   Collection Time: 08/02/17 12:15 PM  Result Value Ref Range Status   Specimen Description   Final    BLOOD RIGHT HAND Performed at [facility] [address], [place], [place] [address]    Special Requests   Final    IN PEDIATRIC BOTTLE Blood Culture adequate volume Performed at [facility] [address] [place], [place] [address]    Culture   Final    NO GROWTH 2 DAYS Performed at [facility] [address] [place], [place] [address]    Report Status PENDING  Incomplete     Labs: CBC: Recent Labs  Lab 07/30/17 1723  08/01/17 0810 08/01/17 1610 08/02/17 0754 08/03/17 0531 08/04/17 0650  WBC 9.2   < > 9.8 10.5 13.0* 12.3* 12.6*  NEUTROABS 8.1  --   --   --   --   --   --   HGB 9.4*   < > 8.3* 8.6* 8.5* 8.7* 8.8*  HCT 29.6*   < > 25.4* 25.8* 25.5* 27.2* 26.6*  MCV 96.7   < > 94.4 94.2 94.1 95.1 95.0  PLT 133*   < > 149* 101* 125* 139* 146*   < > = values in this interval not displayed.   Basic Metabolic Panel: Recent Labs  Lab 07/31/17 0445 08/01/17 0418 08/02/17 0754 08/03/17 0531 08/04/17 0650  NA 141 142 137 140 140  K 5.0 4.1 4.1 4.1 4.0  CL 113* 115* 110 113* 112*  CO2 18* 18* 19* 19* 20*  GLUCOSE 109* 161* 186* 160* 130*  BUN 55* 57* 58* 57* 49*  CREATININE 3.29* 2.87* 2.20* 1.76* 1.56*  CALCIUM 7.5* 6.9* 7.0* 7.6* 8.3*  MG  --  2.1  --   --   --    Liver Function Tests: Recent Labs   Lab 07/30/17 1723 07/31/17 0445  AST 62* 107*  ALT 26 33  ALKPHOS 48 51  BILITOT 0.6 0.9  PROT 5.8* 5.3*  ALBUMIN 2.7* 2.5*   No results for input(s): LIPASE, AMYLASE in the last 168 hours. No results for input(s): AMMONIA in the last 168 hours. Cardiac Enzymes: No results for input(s): CKTOTAL, CKMB, CKMBINDEX, TROPONINI in the last 168 hours. BNP (last 3 results) No results for input(s): BNP in the last 8760 hours. CBG: Recent Labs  Lab 08/03/17 2009 08/04/17 0000 08/04/17 0448 08/04/17 0739 08/04/17 1156  GLUCAP 188* 161* 130* 131* 143*   Time spent: 35 minutes  Signed:  Berle Mull  [facility] 08/04/2017 , 7:32 PM

## 2017-08-04 NOTE — Care Management Note (Signed)
Case Management Note  Patient Details  Name: [name] MRN: [id number] Date of Birth: [birth year]  Subjective/Objective: PT recc HHPT. [facility] chosen for HHPT-rep Lattie Haw aware of d/c & HHPT order. No further CM needs.                   Action/Plan:d/c home w/HHPT   Expected Discharge Date:  08/04/17               Expected Discharge Plan:  [place]  In-House Referral:     Discharge planning Services  CM Consult  Post Acute Care Choice:    Choice offered to:  Patient  DME Arranged:    DME Agency:     HH Arranged:  PT [place]:  [facility] (formerly [employer])  Status of Service:  Completed, signed off  If discussed at [employer], dates discussed:    Additional Comments:  Dessa Phi, RN 08/04/2017, 3:17 PM

## 2017-08-04 NOTE — Progress Notes (Signed)
Physical Therapy Treatment Patient Details Name: [name] MRN: [id number] DOB: 03-31-1961 Today's Date: 08/04/2017    History of Present Illness Pt. with PMH: systolic CHF, EF 36%, chronic kidney disease stage III, type II DM, multiple myeloma, chronic steroids, a flutter on Xarelto, morbid obesity on BiPAP; admitted on 07/30/2017, presented with complaint of fever and confusion, found to have hydronephrosis with stone    PT Comments    Pt was able to transfer self to EOB with increased time, use of rail and HOB elevated.  Pt was able to self rise using forward momentum off elevated surface.  Pt was able to self amb around room 12 feet with Bariatric walker with much effort due to BMI and hx CHF.  Pt stated he CAN get in/out of his [employer] and has a wheelchair for long distances but it needs a new leg rest.  Advised him he needed to address that with his wheelchair vendor.   Follow Up Recommendations  Home health PT;Outpatient PT(pt stated "I'm NOT going to no Nursing Home".  Pt did agree for Rehab at home)     Equipment Recommendations  None recommended by PT(has everything)    Recommendations for Other Services       Precautions / Restrictions Precautions Precautions: Fall Precaution Comments: CHF EF 35% Restrictions Weight Bearing Restrictions: No    Mobility  Bed Mobility Overal bed mobility: Modified Independent             General bed mobility comments: with increased time plus rail and HOB elevated, pt was able to transfer self to EOB.   Transfers Overall transfer level: Needs assistance Equipment used: Rolling walker (2 wheeled) Transfers: Sit to/from Stand Sit to Stand: +2 safety/equipment;Supervision         General transfer comment: pt able to self rise from elevated bed with forward momentum due to BMI  Ambulation/Gait Ambulation/Gait assistance: Supervision;+2 safety/equipment Ambulation Distance (Feet): 12 Feet Assistive device: Rolling walker  (2 wheeled) Gait Pattern/deviations: Step-through pattern;Decreased stride length;Trunk flexed Gait velocity: decreased   General Gait Details: pt was able to amb around bed to doorway approx 12 feet with increased time however with much effort due to BMI   Stairs            Wheelchair Mobility    Modified Rankin (Stroke Patients Only)       Balance                                            Cognition Arousal/Alertness: Awake/alert Behavior During Therapy: WFL for tasks assessed/performed Overall Cognitive Status: Within Functional Limits for tasks assessed                                        Exercises      General Comments        Pertinent Vitals/Pain Pain Assessment: Faces Faces Pain Scale: Hurts a little bit Pain Location: scrotal region Pain Descriptors / Indicators: Discomfort;Sore Pain Intervention(s): Monitored during session    Home Living                      Prior Function            PT Goals (current goals can now be found in the care  plan section) Progress towards PT goals: Progressing toward goals    Frequency           PT Plan Current plan remains appropriate    Co-evaluation              AM-PAC PT "6 Clicks" Daily Activity  Outcome Measure  Difficulty turning over in bed (including adjusting bedclothes, sheets and blankets)?: A Little Difficulty moving from lying on back to sitting on the side of the bed? : A Little Difficulty sitting down on and standing up from a chair with arms (e.g., wheelchair, bedside commode, etc,.)?: A Little Help needed moving to and from a bed to chair (including a wheelchair)?: A Little Help needed walking in hospital room?: A Little Help needed climbing 3-5 steps with a railing? : A Little 6 Click Score: 18    End of Session Equipment Utilized During Treatment: Gait belt Activity Tolerance: Patient tolerated treatment well;Patient limited by  fatigue Patient left: in chair;with call bell/phone within reach;with nursing/sitter in room(elevated chair)   PT Visit Diagnosis: Difficulty in walking, not elsewhere classified (R26.2)     Time: 0352-4818 PT Time Calculation (min) (ACUTE ONLY): 15 min  Charges:  $Gait Training: 8-22 mins                    G Codes:       Rica Koyanagi  PTA [facility]  Acute  Rehab Pager      [phone]

## 2017-08-04 NOTE — Progress Notes (Signed)
Patient prefers home via [facility]-forms on shadow chart,confirmed address-Nsg to call [facility] when ready. No further CM needs.

## 2017-08-07 LAB — CULTURE, BLOOD (ROUTINE X 2)
Culture: NO GROWTH
Culture: NO GROWTH
Special Requests: ADEQUATE
Special Requests: ADEQUATE

## 2017-08-11 ENCOUNTER — Emergency Department (HOSPITAL_COMMUNITY)
Admission: EM | Admit: 2017-08-11 | Discharge: 2017-08-11 | Disposition: A | Discharge status: Home / Self Care | Payer: BLUE CROSS/BLUE SHIELD | Attending: Emergency Medicine | Admitting: Emergency Medicine

## 2017-08-11 ENCOUNTER — Emergency Department (HOSPITAL_COMMUNITY): Payer: BLUE CROSS/BLUE SHIELD

## 2017-08-11 ENCOUNTER — Encounter (HOSPITAL_COMMUNITY): Payer: Self-pay | Admitting: *Deleted

## 2017-08-11 ENCOUNTER — Other Ambulatory Visit: Payer: Self-pay

## 2017-08-11 DIAGNOSIS — Z79899 Other long term (current) drug therapy: Secondary | ICD-10-CM | POA: Diagnosis not present

## 2017-08-11 DIAGNOSIS — Z794 Long term (current) use of insulin: Secondary | ICD-10-CM | POA: Insufficient documentation

## 2017-08-11 DIAGNOSIS — R0789 Other chest pain: Secondary | ICD-10-CM | POA: Diagnosis not present

## 2017-08-11 DIAGNOSIS — Z7901 Long term (current) use of anticoagulants: Secondary | ICD-10-CM | POA: Diagnosis not present

## 2017-08-11 DIAGNOSIS — I129 Hypertensive chronic kidney disease with stage 1 through stage 4 chronic kidney disease, or unspecified chronic kidney disease: Secondary | ICD-10-CM | POA: Insufficient documentation

## 2017-08-11 DIAGNOSIS — Z87891 Personal history of nicotine dependence: Secondary | ICD-10-CM | POA: Diagnosis not present

## 2017-08-11 DIAGNOSIS — E1122 Type 2 diabetes mellitus with diabetic chronic kidney disease: Secondary | ICD-10-CM | POA: Insufficient documentation

## 2017-08-11 DIAGNOSIS — I252 Old myocardial infarction: Secondary | ICD-10-CM | POA: Diagnosis not present

## 2017-08-11 DIAGNOSIS — Z7982 Long term (current) use of aspirin: Secondary | ICD-10-CM | POA: Diagnosis not present

## 2017-08-11 DIAGNOSIS — N183 Chronic kidney disease, stage 3 (moderate): Secondary | ICD-10-CM | POA: Insufficient documentation

## 2017-08-11 DIAGNOSIS — R079 Chest pain, unspecified: Secondary | ICD-10-CM | POA: Diagnosis present

## 2017-08-11 DIAGNOSIS — Z9884 Bariatric surgery status: Secondary | ICD-10-CM | POA: Diagnosis not present

## 2017-08-11 DIAGNOSIS — I2510 Atherosclerotic heart disease of native coronary artery without angina pectoris: Secondary | ICD-10-CM | POA: Insufficient documentation

## 2017-08-11 LAB — CBC
HCT: 29.3 % — ABNORMAL LOW (ref 39.0–52.0)
Hemoglobin: 9.1 g/dL — ABNORMAL LOW (ref 13.0–17.0)
MCH: 29.6 pg (ref 26.0–34.0)
MCHC: 31.1 g/dL (ref 30.0–36.0)
MCV: 95.4 fL (ref 78.0–100.0)
Platelets: 230 10*3/uL (ref 150–400)
RBC: 3.07 MIL/uL — ABNORMAL LOW (ref 4.22–5.81)
RDW: 15.8 % — ABNORMAL HIGH (ref 11.5–15.5)
WBC: 11 10*3/uL — ABNORMAL HIGH (ref 4.0–10.5)

## 2017-08-11 LAB — BASIC METABOLIC PANEL
Anion gap: 10 (ref 5–15)
BUN: 33 mg/dL — ABNORMAL HIGH (ref 6–20)
CO2: 24 mmol/L (ref 22–32)
Calcium: 8.2 mg/dL — ABNORMAL LOW (ref 8.9–10.3)
Chloride: 104 mmol/L (ref 101–111)
Creatinine, Ser: 1.58 mg/dL — ABNORMAL HIGH (ref 0.61–1.24)
GFR calc Af Amer: 54 mL/min — ABNORMAL LOW (ref >60)
GFR calc non Af Amer: 47 mL/min — ABNORMAL LOW (ref >60)
Glucose, Bld: 203 mg/dL — ABNORMAL HIGH (ref 65–99)
Potassium: 4.2 mmol/L (ref 3.5–5.1)
Sodium: 138 mmol/L (ref 135–145)

## 2017-08-11 LAB — TROPONIN I: Troponin I: <0.03 ng/mL (ref <0.03)

## 2017-08-11 MED ORDER — HYDROMORPHONE HCL 1 MG/ML IJ SOLN
0.5000 mg | Freq: Once | INTRAMUSCULAR | Status: AC
  Administered 2017-08-11: 0.5 mg via INTRAVENOUS
  Filled 2017-08-11: qty 1

## 2017-08-11 MED ORDER — ONDANSETRON HCL 4 MG/2ML IJ SOLN
4.0000 mg | Freq: Once | INTRAMUSCULAR | Status: AC
  Administered 2017-08-11: 4 mg via INTRAVENOUS
  Filled 2017-08-11: qty 2

## 2017-08-11 MED ORDER — HYDROMORPHONE HCL 1 MG/ML IJ SOLN
1.0000 mg | Freq: Once | INTRAMUSCULAR | Status: AC
Start: 2017-08-11 — End: 2017-08-11
  Administered 2017-08-11: 1 mg via INTRAVENOUS
  Filled 2017-08-11: qty 1

## 2017-08-11 MED ORDER — TRAMADOL HCL 50 MG PO TABS: 50.0000 mg | ORAL_TABLET | Freq: Four times a day (QID) | ORAL | 0 refills | Status: DC | PRN

## 2017-08-11 NOTE — ED Provider Notes (Signed)
[facility] EMERGENCY DEPARTMENT Provider Note   CSN: [id number] Arrival date & time: 08/11/17  0759     History   Chief Complaint Chief Complaint  Patient presents with  . Chest Pain    HPI [name] is a 57 y.o. male.  Patient complains of left-sided chest pain.  Patient had a fall recently and has been hurting ever since then   The history is provided by the patient.  Chest Pain   This is a new problem. The current episode started more than 2 days ago. The problem occurs constantly. The problem has not changed since onset.The pain is associated with movement. The pain is present in the substernal region. The pain is at a severity of 4/10. The pain is moderate. Pertinent negatives include no abdominal pain, no back pain, no cough and no headaches.  Pertinent negatives for past medical history include no seizures.    Past Medical History:  Diagnosis Date  . Anemia   . Arteriosclerotic cardiovascular disease (ASCVD)    MI-2000s; stent to the proximal LAD and diagonal in 2001; stress nuclear in 2008-impaired exercise capacity, left ventricular dilatation, moderately to severely depressed EF, apical, inferior and anteroseptal scar  . Arthritis   . Atrial flutter ([place])   . 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 Cellulitis of leg    both legs  . Chronic diarrhea   . Chronic kidney disease, stage 3, mod decreased GFR (HCC)    Creatinine of 1.84 in 06/2011 and 1.5 in 07/2011  . Diabetes mellitus    Insulin  . Dysrhythmia    AFlutter  . GERD (gastroesophageal reflux disease)   . Gout   . Hyperlipidemia   . Hypertension   . Injection site reaction   . Multiple myeloma 07/01/2011  . Myocardial infarction ([place]) 2000  . Obesity   . Pedal edema    Venous insufficiency  . Sleep apnea    uses cpap  . Ulcer     Patient Active Problem List   Diagnosis Date Noted  . Pressure injury of skin 08/04/2017  . Sepsis secondary to UTI ([place]) 07/31/2017  . AKI (acute kidney  injury) ([place]) 07/31/2017  . Sepsis ([place]) 07/31/2017  . Fournier gangrene 04/14/2017  . Epididymitis 04/13/2017  . Acute epididymitis 04/13/2017  . Pneumonia 06/10/2016  . CAP (community acquired pneumonia) 06/10/2016  . Cellulitis of left lower extremity   . Hypogammaglobulinemia ([place]) 03/09/2016  . Hypokalemia 03/09/2016  . Diaphragmatic hernia without obstruction 02/26/2016  . Chronic systolic heart failure ([place]) 12/19/2013  . Anemia, normocytic normochromic 10/09/2012  . Chronic pancreatitis ([place]) 10/07/2012  . Atrial flutter ([place]) 09/17/2012  . DDD (degenerative disc disease), cervical 03/18/2012  . Arteriosclerotic cardiovascular disease (ASCVD)   . Hypertension   . Hyperlipidemia   . Multiple myeloma ([place]) 07/01/2011  . Morbid obesity ([place]) 04/29/2010  . Insulin dependent diabetes mellitus ([place]) 11/15/2008  . Sleep apnea 11/15/2008    Past Surgical History:  Procedure Laterality Date  . ABSCESS DRAINAGE     Scrotal  . BIOPSY  01/02/2012   Procedure: BIOPSY;  Surgeon: Rogene Houston, MD;  Location[facility];  Service: Endoscopy;  Laterality: N/A;  . BONE MARROW BIOPSY  05/13/11  . CARDIAC CATHETERIZATION     cardiac stent  . CARDIOVERSION N/A 10/13/2012   Procedure: CARDIOVERSION;  Surgeon: Yehuda Savannah, MD;  Location: [facility] ORS;  Service: Cardiovascular;  Laterality: N/A;  . CATARACT EXTRACTION W/PHACO Left 02/13/2014   Procedure: CATARACT EXTRACTION  PHACO AND INTRAOCULAR LENS PLACEMENT (IOC);  Surgeon: Tonny Branch, MD;  Location: [facility] OR[facility];  Service: Ophthalmology;  Laterality: Left;  CDE:  7.67  . CATARACT EXTRACTION W/PHACO Right 03/02/2014   Procedure: CATARACT EXTRACTION PHACO AND INTRAOCULAR LENS PLACEMENT RIGHT EYE CDE=16.81;  Surgeon: Tonny Branch, MD;  Location: [facility];  Service: Ophthalmology;  Laterality: Right;  . COLONOSCOPY  11/28/2011   Procedure: COLONOSCOPY;  Surgeon: Rogene Houston, MD;  [facility];  Service: Endoscopy;  Laterality: N/A;  930   . CYSTOSCOPY W/ URETERAL STENT PLACEMENT Right 07/31/2017   Procedure: CYSTOSCOPY WITH RETROGRADE PYELOGRAM/URETERAL STENT PLACEMENT;  Surgeon: Cleon Gustin, MD;  Location: [facility];  Service: Urology;  Laterality: Right;  . ESOPHAGOGASTRODUODENOSCOPY  01/02/2012   Procedure: ESOPHAGOGASTRODUODENOSCOPY (EGD);  Surgeon: Rogene Houston, MD;  [facility];  Service: Endoscopy;  Laterality: N/A;  100  . ESOPHAGOGASTRODUODENOSCOPY N/A 09/20/2012   Procedure: ESOPHAGOGASTRODUODENOSCOPY (EGD);  Surgeon: Rogene Houston, MD;  [facility];  Service: Endoscopy;  Laterality: N/A;  . EUS N/A 10/07/2012   Procedure: UPPER ENDOSCOPIC ULTRASOUND (EUS) LINEAR;  Surgeon: Milus Banister, MD;  [facility];  Service: Endoscopy;  Laterality: N/A;  . INCISION AND DRAINAGE ABSCESS N/A 04/14/2017   Procedure: INCISION AND DRAINAGE ABSCESS;  Surgeon: Ceasar Mons, MD;  [facility];  Service: Urology;  Laterality: N/A;  . INCISION AND DRAINAGE ABSCESS ANAL    . IRRIGATION AND DEBRIDEMENT ABSCESS N/A 04/15/2017   Procedure: DEBRIDEMENT SCROTAL WOUND AND DRESSING CHANGE;  Surgeon: Ceasar Mons, MD;  [facility];  Service: Urology;  Laterality: N/A;  . IRRIGATION AND DEBRIDEMENT ABSCESS N/A 04/17/2017   Procedure: IRRIGATION AND DEBRIDEMENT ABSCESS;  Surgeon: Ceasar Mons, MD;  [facility];  Service: Urology;  Laterality: N/A;  RM 3  . LAPAROSCOPIC GASTRIC BANDING  2006   has been removed  . OSTECTOMY Right 04/08/2017   Procedure: OSTECTOMY RIGHT GREAT TOE;  Surgeon: Caprice Beaver, DPM;  Location: [facility];  Service: Podiatry;  Laterality: Right;  . PORT-A-CATH REMOVAL Left 12/07/2012   Procedure: REMOVAL PORT-A-CATH;  Surgeon: Scherry Ran, MD;  Location: [facility];  Service: General;  Laterality: Left;  . PORTACATH PLACEMENT  07/07/2011   Procedure: INSERTION PORT-A-CATH;  Surgeon: Scherry Ran, MD;  Location: [facility];   Service: General;  Laterality: N/A;  . PORTACATH PLACEMENT N/A 12/07/2012   Procedure: INSERTION PORT-A-CATH;  Surgeon: Scherry Ran, MD;  Location: [facility];  Service: General;  Laterality: N/A;  Attempted portacath placement on left and right side  . WOUND DEBRIDEMENT Right 04/08/2017   Procedure: EXCISION ULCERATION RIGHT GREAT TOE;  Surgeon: Caprice Beaver, DPM;  Location: [facility];  Service: Podiatry;  Laterality: Right;  . WRIST SURGERY     Left; removal of bone fragment       Home Medications    Prior to Admission medications   Medication Sig Start Date End Date Taking? Authorizing Provider  acyclovir (ZOVIRAX) 400 MG tablet Take 1 tablet (400 mg total) by mouth every morning. 02/18/17  Yes Twana First, MD  allopurinol (ZYLOPRIM) 300 MG tablet TAKE ONE TABLET BY MOUTH ONCE DAILY. 05/11/17  Yes Holley Bouche, NP  amoxicillin-clavulanate (AUGMENTIN) 500-125 MG tablet Take 1 tablet (500 mg total) by mouth 3 (three) times daily for 10 days. 08/04/17 08/14/17 Yes Lavina Hamman, MD  Artificial Tear Ointment (DRY EYES OP) Place 1 drop into both eyes as needed (dry eyes).  Yes [provider]  aspirin EC 81 MG tablet Take 81 mg by mouth daily.   Yes [provider]  atorvastatin (LIPITOR) 80 MG tablet TAKE ONE TABLET BY MOUTH AT BEDTIME. 12/18/16  Yes Herminio Commons, MD  calcitRIOL (ROCALTROL) 0.25 MCG capsule Take 0.25 mcg by mouth 3 (three) times a week. Monday, Wednesday, Friday. 12/14/13  Yes [provider]  Calcium Carbonate (CALCIUM 600 PO) Take 1 tablet by mouth 2 (two) times daily.   Yes [provider]  Calcium Carbonate-Vit D-Min (CALCIUM 1200 PO) Take 1 tablet by mouth 2 (two) times daily.   Yes [provider]  dexlansoprazole (DEXILANT) 60 MG capsule Take 60 mg by mouth every morning.   Yes [provider]  dicyclomine (BENTYL) 20 MG tablet TAKE 1 TABLET BY MOUTH BEFORE MEALS 3 TIMES DAILY. 05/18/17  Yes Rehman,  Mechele Dawley, MD  Eluxadoline (VIBERZI) 100 MG TABS Take 100 mg 2 (two) times daily with a meal by mouth.    Yes [provider]  gabapentin (NEURONTIN) 300 MG capsule Take 1 capsule (300 mg total) by mouth 2 (two) times daily. 04/22/17  Yes Short, Noah Delaine, MD  insulin detemir (LEVEMIR) 100 UNIT/ML injection Inject 0.1 mLs (10 Units total) into the skin every morning. 04/22/17  Yes Short, Noah Delaine, MD  insulin lispro (HUMALOG) 100 UNIT/ML injection Inject 0-0.1 mLs (0-10 Units total) into the skin 2 (two) times daily with a meal. Sliding Scale per patient  CBG 150-200: 1 units CBG 201-275:  3 units CBG 275-350: 5 units 351-425: 7 units 425-500: 9 units 500+: 10 units 04/22/17  Yes Short, Mackenzie, MD  loratadine (CLARITIN) 10 MG tablet Take 10 mg by mouth every morning.    Yes [provider]  Magnesium Cl-Calcium Carbonate (SLOW-MAG PO) Take 1 tablet by mouth every morning.   Yes [provider]  methocarbamol (ROBAXIN) 500 MG tablet Take 1 tablet (500 mg total) by mouth every 8 (eight) hours as needed for muscle spasms. 08/04/17  Yes Lavina Hamman, MD  metoprolol succinate (TOPROL-XL) 25 MG 24 hr tablet TAKE (1) TABLET BY MOUTH ONCE DAILY. 07/06/17  Yes Herminio Commons, MD  Multiple Vitamins-Minerals (MULTIVITAMINS THER. W/MINERALS) TABS Take 1 tablet by mouth daily.    Yes [provider]  nitroGLYCERIN (NITROSTAT) 0.4 MG SL tablet DISSOLVE 1 TABLET UNDER TONGUE EVERY 5 MINUTES UP TO 15 MIN FOR CHESTPAIN. IF NO RELIEF CALL 911. 05/11/17  Yes Holley Bouche, NP  potassium chloride SA (K-DUR,KLOR-CON) 20 MEQ tablet Take 1 tablet (20 mEq total) by mouth daily. 08/05/17  Yes Lavina Hamman, MD  sulfamethoxazole-trimethoprim (BACTRIM DS,SEPTRA DS) 800-160 MG tablet TAKE ONE TABLET BY MOUTH EVERY MONDAY, WEDNESDAY, AND FRIDAY. 06/12/17  Yes Holley Bouche, NP  torsemide (DEMADEX) 20 MG tablet Take 1 tablet (20 mg total) by mouth daily. Take extra 1 tab  for weight gain of 3 lbs in 1 day or 5 lbs in 2 days 08/05/17  Yes Lavina Hamman, MD  Vitamin D, Ergocalciferol, (DRISDOL) 50000 units CAPS capsule TAKE 1 CAPSULE BY MOUTH EVERY THIRTY DAYS. 05/11/17  Yes Holley Bouche, NP  XARELTO 20 MG TABS tablet TAKE 1 TABLET BY MOUTH DAILY. Patient taking differently: TAKE 1 TABLET BY MOUTH EVERY DAY IN THE MORNING 09/02/16  Yes Herminio Commons, MD  traMADol (ULTRAM) 50 MG tablet Take 1 tablet (50 mg total) by mouth every 6 (six) hours as needed. 08/11/17   Milton Ferguson, MD  Family History Family History  Problem Relation Age of Onset  . Heart disease Mother   . Cancer Mother   . Diabetes Father   . Arthritis Unknown   . Anesthesia problems Neg Hx   . Hypotension Neg Hx   . Malignant hyperthermia Neg Hx   . Pseudochol deficiency Neg Hx     Social History Social History   Tobacco Use  . Smoking status: Former Smoker    Packs/day: 0.25    Years: 1.00    Pack years: 0.25    Types: Cigarettes, Cigars    Last attempt to quit: 05/17/2001    Years since quitting: 16.2  . Smokeless tobacco: Never Used  Substance Use Topics  . Alcohol use: No    Alcohol/week: 0.0 oz  . Drug use: No     Allergies   Patient has no known allergies.   Review of Systems Review of Systems  Constitutional: Negative for appetite change and fatigue.  HENT: Negative for congestion, ear discharge and sinus pressure.   Eyes: Negative for discharge.  Respiratory: Negative for cough.   Cardiovascular: Positive for chest pain.  Gastrointestinal: Negative for abdominal pain and diarrhea.  Genitourinary: Negative for frequency and hematuria.  Musculoskeletal: Negative for back pain.  Skin: Negative for rash.  Neurological: Negative for seizures and headaches.  Psychiatric/Behavioral: Negative for hallucinations.     Physical Exam Updated Vital Signs BP 127/70 (BP Location: Right Arm)   Pulse (!) 56   Resp 17   Ht 6' 2" (1.88 m)   Wt (!) 172.8 kg  (381 lb)   SpO2 100%   BMI 48.92 kg/m   Physical Exam  Constitutional: He is oriented to person, place, and time. He appears well-developed.  HENT:  Head: Normocephalic.  Eyes: Conjunctivae and EOM are normal. No scleral icterus.  Neck: Neck supple. No thyromegaly present.  Cardiovascular: Normal rate and regular rhythm. Exam reveals no gallop and no friction rub.  No murmur heard. Patient with tenderness to left upper chest  Pulmonary/Chest: No stridor. He has no wheezes. He has no rales. He exhibits no tenderness.  Abdominal: He exhibits no distension. There is no tenderness. There is no rebound.  Musculoskeletal: Normal range of motion. He exhibits no edema.  Lymphadenopathy:    He has no cervical adenopathy.  Neurological: He is oriented to person, place, and time. He exhibits normal muscle tone. Coordination normal.  Skin: No rash noted. No erythema.  Psychiatric: He has a normal mood and affect. His behavior is normal.     ED Treatments / Results  Labs (all labs ordered are listed, but only abnormal results are displayed) Labs Reviewed  BASIC METABOLIC PANEL - Abnormal; Notable for the following components:      Result Value   Glucose, Bld 203 (*)    BUN 33 (*)    Creatinine, Ser 1.58 (*)    Calcium 8.2 (*)    GFR calc non Af Amer 47 (*)    GFR calc Af Amer 54 (*)    All other components within normal limits  CBC - Abnormal; Notable for the following components:   WBC 11.0 (*)    RBC 3.07 (*)    Hemoglobin 9.1 (*)    HCT 29.3 (*)    RDW 15.8 (*)    All other components within normal limits  TROPONIN I    EKG  EKG Interpretation  Date/Time:  Tuesday August 11 2017 08:07:18 EDT Ventricular Rate:  64 PR Interval:  QRS Duration: 93 QT Interval:  484 QTC Calculation: 500 R Axis:   3 Text Interpretation:  Atrial fibrillation Multi interpolated vent premature complexes Anteroseptal infarct, age indeterminate Confirmed by Milton Ferguson [phone]) on 08/11/2017  8:55:07 AM Also confirmed by Milton Ferguson [phone])  on 08/11/2017 1:56:51 PM       Radiology Dg Chest Portable 1 View  Result Date: 08/11/2017 CLINICAL DATA:  Chest pain and shortness of breath. EXAM: PORTABLE CHEST 1 VIEW COMPARISON:  Chest x-rays dated 07/30/2017 and 06/11/2016 and chest CT dated 07/30/2017 FINDINGS: Power port in place, unchanged. Chronic cardiomegaly. Chronic focal eventration of the left hemidiaphragm. Pulmonary vascularity is normal. IMPRESSION: 1. No acute abnormality. 2. Chronic cardiomegaly. 3. Chronic focal eventration of the lateral aspect of the left hemidiaphragm. Electronically Signed   By: Lorriane Shire M.D.   On: 08/11/2017 09:29    Procedures Procedures (including critical care time)  Medications Ordered in ED Medications  HYDROmorphone (DILAUDID) injection 0.5 mg (0.5 mg Intravenous Given 08/11/17 0909)  ondansetron (ZOFRAN) injection 4 mg (4 mg Intravenous Given 08/11/17 0909)  HYDROmorphone (DILAUDID) injection 1 mg (1 mg Intravenous Given 08/11/17 1008)  HYDROmorphone (DILAUDID) injection 0.5 mg (0.5 mg Intravenous Given 08/11/17 1349)     Initial Impression / Assessment and Plan / ED Course  I have reviewed the triage vital signs and the nursing notes.  Pertinent labs & imaging results that were available during my care of the patient were reviewed by me and considered in my medical decision making (see chart for details).     Labs unremarkable chest x-ray unremarkable patient with chest wall pain from a fall.  He was given some Ultram  Final Clinical Impressions(s) / ED Diagnoses   Final diagnoses:  Chest wall pain    ED Discharge Orders        Ordered    traMADol (ULTRAM) 50 MG tablet  Every 6 hours PRN     08/11/17 1400       Milton Ferguson, MD 08/11/17 1417

## 2017-08-11 NOTE — ED Triage Notes (Addendum)
[facility] states that the pt is having centralized chest pain radiating down his left arm x 1 week. Pt took 325mg  of aspirin at home along with 1 0.4mg  nitro without relief. Pt received 1-0.4mg  of nitro by ems without relief. Pt took a muscle relaxer this morning without relief.

## 2017-08-11 NOTE — Discharge Instructions (Signed)
Follow-up with your family doctor next week for recheck. 

## 2017-08-11 NOTE — ED Notes (Signed)
Have notified charge nurse that patient is discharged. Just waiting for ride to get here. Pt is very obese and will not be able to sit in a wheelchair for very long. Charge nurse agreed.

## 2017-08-11 NOTE — ED Notes (Signed)
Dr. Roderic Palau made aware that pt fell a week ago on his left shoulder. Pt has dark purple bruising to his left shoulder and left upper arm.

## 2017-08-13 ENCOUNTER — Observation Stay (HOSPITAL_COMMUNITY)
Admission: EM | Admit: 2017-08-13 | Discharge: 2017-08-14 | Disposition: A | Discharge status: Home / Self Care | Payer: BLUE CROSS/BLUE SHIELD | Attending: Internal Medicine | Admitting: Internal Medicine

## 2017-08-13 ENCOUNTER — Other Ambulatory Visit: Payer: Self-pay

## 2017-08-13 ENCOUNTER — Encounter (HOSPITAL_COMMUNITY): Payer: Self-pay | Admitting: Emergency Medicine

## 2017-08-13 ENCOUNTER — Other Ambulatory Visit (HOSPITAL_COMMUNITY): Payer: BLUE CROSS/BLUE SHIELD

## 2017-08-13 ENCOUNTER — Emergency Department (HOSPITAL_COMMUNITY): Payer: BLUE CROSS/BLUE SHIELD

## 2017-08-13 DIAGNOSIS — E119 Type 2 diabetes mellitus without complications: Secondary | ICD-10-CM

## 2017-08-13 DIAGNOSIS — R531 Weakness: Secondary | ICD-10-CM | POA: Diagnosis not present

## 2017-08-13 DIAGNOSIS — I5022 Chronic systolic (congestive) heart failure: Secondary | ICD-10-CM | POA: Diagnosis not present

## 2017-08-13 DIAGNOSIS — Z9884 Bariatric surgery status: Secondary | ICD-10-CM | POA: Insufficient documentation

## 2017-08-13 DIAGNOSIS — M6281 Muscle weakness (generalized): Secondary | ICD-10-CM | POA: Diagnosis not present

## 2017-08-13 DIAGNOSIS — Z87891 Personal history of nicotine dependence: Secondary | ICD-10-CM | POA: Diagnosis not present

## 2017-08-13 DIAGNOSIS — D649 Anemia, unspecified: Secondary | ICD-10-CM | POA: Diagnosis not present

## 2017-08-13 DIAGNOSIS — C9000 Multiple myeloma not having achieved remission: Secondary | ICD-10-CM

## 2017-08-13 DIAGNOSIS — E1122 Type 2 diabetes mellitus with diabetic chronic kidney disease: Secondary | ICD-10-CM | POA: Insufficient documentation

## 2017-08-13 DIAGNOSIS — N183 Chronic kidney disease, stage 3 (moderate): Secondary | ICD-10-CM | POA: Diagnosis not present

## 2017-08-13 DIAGNOSIS — I130 Hypertensive heart and chronic kidney disease with heart failure and stage 1 through stage 4 chronic kidney disease, or unspecified chronic kidney disease: Secondary | ICD-10-CM | POA: Insufficient documentation

## 2017-08-13 DIAGNOSIS — Z794 Long term (current) use of insulin: Secondary | ICD-10-CM | POA: Insufficient documentation

## 2017-08-13 DIAGNOSIS — Z8579 Personal history of other malignant neoplasms of lymphoid, hematopoietic and related tissues: Secondary | ICD-10-CM | POA: Diagnosis not present

## 2017-08-13 DIAGNOSIS — Z7901 Long term (current) use of anticoagulants: Secondary | ICD-10-CM | POA: Diagnosis not present

## 2017-08-13 DIAGNOSIS — I2510 Atherosclerotic heart disease of native coronary artery without angina pectoris: Secondary | ICD-10-CM | POA: Insufficient documentation

## 2017-08-13 DIAGNOSIS — Z7982 Long term (current) use of aspirin: Secondary | ICD-10-CM | POA: Diagnosis not present

## 2017-08-13 DIAGNOSIS — N189 Chronic kidney disease, unspecified: Secondary | ICD-10-CM | POA: Diagnosis present

## 2017-08-13 DIAGNOSIS — I252 Old myocardial infarction: Secondary | ICD-10-CM | POA: Diagnosis not present

## 2017-08-13 DIAGNOSIS — R079 Chest pain, unspecified: Secondary | ICD-10-CM | POA: Diagnosis present

## 2017-08-13 DIAGNOSIS — I4892 Unspecified atrial flutter: Secondary | ICD-10-CM | POA: Diagnosis present

## 2017-08-13 DIAGNOSIS — E118 Type 2 diabetes mellitus with unspecified complications: Secondary | ICD-10-CM

## 2017-08-13 DIAGNOSIS — K921 Melena: Secondary | ICD-10-CM | POA: Diagnosis present

## 2017-08-13 DIAGNOSIS — R778 Other specified abnormalities of plasma proteins: Secondary | ICD-10-CM

## 2017-08-13 DIAGNOSIS — IMO0001 Reserved for inherently not codable concepts without codable children: Secondary | ICD-10-CM

## 2017-08-13 DIAGNOSIS — R7989 Other specified abnormal findings of blood chemistry: Secondary | ICD-10-CM | POA: Insufficient documentation

## 2017-08-13 DIAGNOSIS — R195 Other fecal abnormalities: Secondary | ICD-10-CM | POA: Diagnosis present

## 2017-08-13 LAB — CBC WITH DIFFERENTIAL/PLATELET
Basophils Absolute: 0 10*3/uL (ref 0.0–0.1)
Basophils Relative: 0 %
Eosinophils Absolute: 0.1 10*3/uL (ref 0.0–0.7)
Eosinophils Relative: 1 %
HCT: 28 % — ABNORMAL LOW (ref 39.0–52.0)
Hemoglobin: 8.8 g/dL — ABNORMAL LOW (ref 13.0–17.0)
Lymphocytes Relative: 13 %
Lymphs Abs: 1.3 10*3/uL (ref 0.7–4.0)
MCH: 29.9 pg (ref 26.0–34.0)
MCHC: 31.4 g/dL (ref 30.0–36.0)
MCV: 95.2 fL (ref 78.0–100.0)
Monocytes Absolute: 0.8 10*3/uL (ref 0.1–1.0)
Monocytes Relative: 8 %
Neutro Abs: 7.6 10*3/uL (ref 1.7–7.7)
Neutrophils Relative %: 78 %
Platelets: 224 10*3/uL (ref 150–400)
RBC: 2.94 MIL/uL — ABNORMAL LOW (ref 4.22–5.81)
RDW: 15.7 % — ABNORMAL HIGH (ref 11.5–15.5)
WBC: 9.8 10*3/uL (ref 4.0–10.5)

## 2017-08-13 LAB — COMPREHENSIVE METABOLIC PANEL
ALT: 20 U/L (ref 17–63)
AST: 15 U/L (ref 15–41)
Albumin: 2.5 g/dL — ABNORMAL LOW (ref 3.5–5.0)
Alkaline Phosphatase: 82 U/L (ref 38–126)
Anion gap: 8 (ref 5–15)
BUN: 19 mg/dL (ref 6–20)
CO2: 25 mmol/L (ref 22–32)
Calcium: 8.6 mg/dL — ABNORMAL LOW (ref 8.9–10.3)
Chloride: 102 mmol/L (ref 101–111)
Creatinine, Ser: 1.56 mg/dL — ABNORMAL HIGH (ref 0.61–1.24)
GFR calc Af Amer: 55 mL/min — ABNORMAL LOW (ref >60)
GFR calc non Af Amer: 47 mL/min — ABNORMAL LOW (ref >60)
Glucose, Bld: 216 mg/dL — ABNORMAL HIGH (ref 65–99)
Potassium: 4.4 mmol/L (ref 3.5–5.1)
Sodium: 135 mmol/L (ref 135–145)
Total Bilirubin: 0.6 mg/dL (ref 0.3–1.2)
Total Protein: 5.7 g/dL — ABNORMAL LOW (ref 6.5–8.1)

## 2017-08-13 LAB — BRAIN NATRIURETIC PEPTIDE: B Natriuretic Peptide: 576 pg/mL — ABNORMAL HIGH (ref 0.0–100.0)

## 2017-08-13 LAB — URINALYSIS, ROUTINE W REFLEX MICROSCOPIC
Bilirubin Urine: NEGATIVE
Glucose, UA: NEGATIVE mg/dL
Ketones, ur: NEGATIVE mg/dL
Nitrite: NEGATIVE
Protein, ur: 30 mg/dL — AB
Specific Gravity, Urine: 1.005 (ref 1.005–1.030)
pH: 5 (ref 5.0–8.0)

## 2017-08-13 LAB — POC OCCULT BLOOD, ED: Fecal Occult Bld: NEGATIVE

## 2017-08-13 LAB — RETICULOCYTES
RBC.: 2.99 MIL/uL — ABNORMAL LOW (ref 4.22–5.81)
Retic Count, Absolute: 44.9 10*3/uL (ref 19.0–186.0)
Retic Ct Pct: 1.5 % (ref 0.4–3.1)

## 2017-08-13 LAB — TROPONIN I
Troponin I: 0.03 ng/mL (ref <0.03)
Troponin I: 0.03 ng/mL (ref <0.03)
Troponin I: 0.04 ng/mL (ref <0.03)

## 2017-08-13 LAB — TYPE AND SCREEN
ABO/RH(D): O POS
Antibody Screen: NEGATIVE

## 2017-08-13 LAB — CBG MONITORING, ED: Glucose-Capillary: 185 mg/dL — ABNORMAL HIGH (ref 65–99)

## 2017-08-13 MED ORDER — ADULT MULTIVITAMIN W/MINERALS CH
1.0000 | ORAL_TABLET | Freq: Every day | ORAL | Status: DC
  Administered 2017-08-14: 1 via ORAL
  Filled 2017-08-13: qty 1

## 2017-08-13 MED ORDER — ONDANSETRON HCL 4 MG/2ML IJ SOLN: 4.0000 mg | Freq: Four times a day (QID) | INTRAMUSCULAR | Status: DC | PRN

## 2017-08-13 MED ORDER — MORPHINE SULFATE (PF) 2 MG/ML IV SOLN
2.0000 mg | INTRAVENOUS | Status: DC | PRN
  Administered 2017-08-13 – 2017-08-14 (×4): 2 mg via INTRAVENOUS
  Filled 2017-08-13 (×5): qty 1

## 2017-08-13 MED ORDER — ASPIRIN 81 MG PO CHEW
324.0000 mg | CHEWABLE_TABLET | Freq: Once | ORAL | Status: AC
  Administered 2017-08-13: 324 mg via ORAL
  Filled 2017-08-13: qty 4

## 2017-08-13 MED ORDER — ATORVASTATIN CALCIUM 40 MG PO TABS
80.0000 mg | ORAL_TABLET | Freq: Every day | ORAL | Status: DC
  Administered 2017-08-13: 80 mg via ORAL
  Filled 2017-08-13 (×2): qty 2

## 2017-08-13 MED ORDER — HYDROCODONE-ACETAMINOPHEN 5-325 MG PO TABS
2.0000 | ORAL_TABLET | Freq: Once | ORAL | Status: AC
  Administered 2017-08-13: 2 via ORAL
  Filled 2017-08-13: qty 2

## 2017-08-13 MED ORDER — NITROGLYCERIN 0.4 MG SL SUBL
0.4000 mg | SUBLINGUAL_TABLET | SUBLINGUAL | Status: DC | PRN
  Administered 2017-08-14: 0.4 mg via SUBLINGUAL
  Filled 2017-08-13: qty 1

## 2017-08-13 MED ORDER — ACYCLOVIR 800 MG PO TABS
400.0000 mg | ORAL_TABLET | Freq: Every morning | ORAL | Status: DC
  Administered 2017-08-14: 400 mg via ORAL
  Filled 2017-08-13: qty 1

## 2017-08-13 MED ORDER — FUROSEMIDE 10 MG/ML IJ SOLN
40.0000 mg | INTRAMUSCULAR | Status: AC
  Administered 2017-08-13: 40 mg via INTRAVENOUS
  Filled 2017-08-13: qty 4

## 2017-08-13 MED ORDER — ACETAMINOPHEN 325 MG PO TABS: 650.0000 mg | ORAL_TABLET | ORAL | Status: DC | PRN

## 2017-08-13 MED ORDER — GABAPENTIN 300 MG PO CAPS
300.0000 mg | ORAL_CAPSULE | Freq: Two times a day (BID) | ORAL | Status: DC
  Administered 2017-08-13 – 2017-08-14 (×2): 300 mg via ORAL
  Filled 2017-08-13 (×2): qty 1

## 2017-08-13 MED ORDER — TORSEMIDE 20 MG PO TABS
20.0000 mg | ORAL_TABLET | Freq: Every day | ORAL | Status: DC
  Administered 2017-08-14: 20 mg via ORAL
  Filled 2017-08-13 (×2): qty 1

## 2017-08-13 MED ORDER — METHOCARBAMOL 500 MG PO TABS: 500.0000 mg | ORAL_TABLET | Freq: Three times a day (TID) | ORAL | Status: DC | PRN

## 2017-08-13 MED ORDER — AMOXICILLIN-POT CLAVULANATE 500-125 MG PO TABS
1.0000 | ORAL_TABLET | Freq: Three times a day (TID) | ORAL | Status: DC
  Administered 2017-08-13 – 2017-08-14 (×3): 500 mg via ORAL
  Filled 2017-08-13 (×3): qty 1

## 2017-08-13 MED ORDER — GI COCKTAIL ~~LOC~~
30.0000 mL | Freq: Four times a day (QID) | ORAL | Status: DC | PRN
  Administered 2017-08-14: 30 mL via ORAL
  Filled 2017-08-13: qty 30

## 2017-08-13 MED ORDER — LORATADINE 10 MG PO TABS
10.0000 mg | ORAL_TABLET | Freq: Every morning | ORAL | Status: DC
  Administered 2017-08-14: 10 mg via ORAL
  Filled 2017-08-13: qty 1

## 2017-08-13 MED ORDER — INSULIN ASPART 100 UNIT/ML ~~LOC~~ SOLN
0.0000 [IU] | Freq: Three times a day (TID) | SUBCUTANEOUS | Status: DC
  Administered 2017-08-14 (×2): 2 [IU] via SUBCUTANEOUS

## 2017-08-13 MED ORDER — POTASSIUM CHLORIDE CRYS ER 20 MEQ PO TBCR
20.0000 meq | EXTENDED_RELEASE_TABLET | Freq: Every day | ORAL | Status: DC
  Administered 2017-08-14: 20 meq via ORAL
  Filled 2017-08-13: qty 1

## 2017-08-13 MED ORDER — CALCITRIOL 0.25 MCG PO CAPS
0.2500 ug | ORAL_CAPSULE | ORAL | Status: DC
  Filled 2017-08-13: qty 1

## 2017-08-13 MED ORDER — RIVAROXABAN 20 MG PO TABS
20.0000 mg | ORAL_TABLET | Freq: Every day | ORAL | Status: DC
  Administered 2017-08-14: 20 mg via ORAL
  Filled 2017-08-13 (×2): qty 1

## 2017-08-13 MED ORDER — ELUXADOLINE 100 MG PO TABS: 100.0000 mg | ORAL_TABLET | Freq: Two times a day (BID) | ORAL | Status: DC

## 2017-08-13 MED ORDER — ALLOPURINOL 300 MG PO TABS
300.0000 mg | ORAL_TABLET | Freq: Every day | ORAL | Status: DC
  Administered 2017-08-14: 300 mg via ORAL
  Filled 2017-08-13 (×2): qty 1

## 2017-08-13 MED ORDER — ASPIRIN EC 81 MG PO TBEC
81.0000 mg | DELAYED_RELEASE_TABLET | Freq: Every day | ORAL | Status: DC
  Administered 2017-08-14: 81 mg via ORAL
  Filled 2017-08-13: qty 1

## 2017-08-13 MED ORDER — ASPIRIN EC 325 MG PO TBEC: 325.0000 mg | DELAYED_RELEASE_TABLET | Freq: Every day | ORAL | Status: DC

## 2017-08-13 MED ORDER — INSULIN DETEMIR 100 UNIT/ML ~~LOC~~ SOLN
10.0000 [IU] | Freq: Every morning | SUBCUTANEOUS | Status: DC
  Administered 2017-08-14: 10 [IU] via SUBCUTANEOUS
  Filled 2017-08-13 (×4): qty 0.1

## 2017-08-13 NOTE — ED Notes (Signed)
Pt reports last night he had an episode of bloody stool as well as this morning. Denies straining or hx of hemorrhoids. Pt also c/o pain in his chest, which he was seen for two days ago and was told it was muscle spasms. Tramadol prescribed is not helping.

## 2017-08-13 NOTE — ED Notes (Signed)
Report given to 300 RN at this time.  

## 2017-08-13 NOTE — H&P (Signed)
History and Physical    WARD BOISSONNEAULT YPP:[id number] DOB: 02/04/1961 DOA: 08/13/2017  PCP: [name], MD  Patient coming fro home  Chief Complaint: Very weak  HPI: [name] is a 57 y.o. male with medical history significant of obesity, recent sepsis from kidney stone status post stenting, chronic kidney disease, multiple myeloma comes in to the emergency room today for several reasons.  Since his discharge last week from the hospital for his sepsis he has been very weak at home.  He reports that physical therapy has not started at home yet.  He has been having trouble getting around and today he got weak and his knees buckled any failed to the floor.  He has had several falls in the last week.  He also reports some bright red blood per rectum and a bowel movement from last night.  He denies any nausea or vomiting.  He denies any abdominal pain.  He is also reporting chest pain across his right chest that is persistent for over 24 hours it is not relieved or worsened by anything.  He has bruising to his left shoulder from a previous fall.  I cannot reproduce his pain.  He denies any fevers or cough.  Patient is being referred for admission for his chest pain along with his generalized weakness along with his possible lower GI bleed.  Rectal exam in the emergency department was done by Dr. Sabra Heck which revealed no obvious bleeding and was Hemoccult negative.   Review of Systems: As per HPI otherwise 10 point review of systems negative.   Past Medical History:  Diagnosis Date  . Anemia   . Arteriosclerotic cardiovascular disease (ASCVD)    MI-2000s; stent to the proximal LAD and diagonal in 2001; stress nuclear in 2008-impaired exercise capacity, left ventricular dilatation, moderately to severely depressed EF, apical, inferior and anteroseptal scar  . Arthritis   . Atrial flutter ([place])   . 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 Cellulitis of leg    both legs  . Chronic diarrhea     . Chronic kidney disease, stage 3, mod decreased GFR (HCC)    Creatinine of 1.84 in 06/2011 and 1.5 in 07/2011  . Diabetes mellitus    Insulin  . Dysrhythmia    AFlutter  . GERD (gastroesophageal reflux disease)   . Gout   . Hyperlipidemia   . Hypertension   . Injection site reaction   . Multiple myeloma 07/01/2011  . Myocardial infarction ([place]) 2000  . Obesity   . Pedal edema    Venous insufficiency  . Sleep apnea    uses cpap  . Ulcer     Past Surgical History:  Procedure Laterality Date  . ABSCESS DRAINAGE     Scrotal  . BIOPSY  01/02/2012   Procedure: BIOPSY;  Surgeon: Rogene Houston, MD;  [facility];  Service: Endoscopy;  Laterality: N/A;  . BONE MARROW BIOPSY  05/13/11  . CARDIAC CATHETERIZATION     cardiac stent  . CARDIOVERSION N/A 10/13/2012   Procedure: CARDIOVERSION;  Surgeon: Yehuda Savannah, MD;  Location: [facility];  Service: Cardiovascular;  Laterality: N/A;  . CATARACT EXTRACTION W/PHACO Left 02/13/2014   Procedure: CATARACT EXTRACTION PHACO AND INTRAOCULAR LENS PLACEMENT ([place]);  Surgeon: Tonny Branch, MD;  Location: [facility];  Service: Ophthalmology;  Laterality: Left;  CDE:  7.67  . CATARACT EXTRACTION W/PHACO Right 03/02/2014   Procedure: CATARACT EXTRACTION PHACO AND INTRAOCULAR LENS PLACEMENT RIGHT EYE CDE=16.81;  Surgeon: Levada Dy  Geoffry Paradise, MD;  Location: [facility];  Service: Ophthalmology;  Laterality: Right;  . COLONOSCOPY  11/28/2011   Procedure: COLONOSCOPY;  Surgeon: Rogene Houston, MD;  [facility];  Service: Endoscopy;  Laterality: N/A;  930  . CYSTOSCOPY W/ URETERAL STENT PLACEMENT Right 07/31/2017   Procedure: CYSTOSCOPY WITH RETROGRADE PYELOGRAM/URETERAL STENT PLACEMENT;  Surgeon: Cleon Gustin, MD;  Location: [facility];  Service: Urology;  Laterality: Right;  . ESOPHAGOGASTRODUODENOSCOPY  01/02/2012   Procedure: ESOPHAGOGASTRODUODENOSCOPY (EGD);  Surgeon: Rogene Houston, MD;  [facility];  Service: Endoscopy;  Laterality: N/A;   100  . ESOPHAGOGASTRODUODENOSCOPY N/A 09/20/2012   Procedure: ESOPHAGOGASTRODUODENOSCOPY (EGD);  Surgeon: Rogene Houston, MD;  [facility];  Service: Endoscopy;  Laterality: N/A;  . EUS N/A 10/07/2012   Procedure: UPPER ENDOSCOPIC ULTRASOUND (EUS) LINEAR;  Surgeon: Milus Banister, MD;  [facility];  Service: Endoscopy;  Laterality: N/A;  . INCISION AND DRAINAGE ABSCESS N/A 04/14/2017   Procedure: INCISION AND DRAINAGE ABSCESS;  Surgeon: Ceasar Mons, MD;  [facility];  Service: Urology;  Laterality: N/A;  . INCISION AND DRAINAGE ABSCESS ANAL    . IRRIGATION AND DEBRIDEMENT ABSCESS N/A 04/15/2017   Procedure: DEBRIDEMENT SCROTAL WOUND AND DRESSING CHANGE;  Surgeon: Ceasar Mons, MD;  [facility];  Service: Urology;  Laterality: N/A;  . IRRIGATION AND DEBRIDEMENT ABSCESS N/A 04/17/2017   Procedure: IRRIGATION AND DEBRIDEMENT ABSCESS;  Surgeon: Ceasar Mons, MD;  [facility];  Service: Urology;  Laterality: N/A;  RM 3  . LAPAROSCOPIC GASTRIC BANDING  2006   has been removed  . OSTECTOMY Right 04/08/2017   Procedure: OSTECTOMY RIGHT GREAT TOE;  Surgeon: Caprice Beaver, DPM;  Location: [facility];  Service: Podiatry;  Laterality: Right;  . PORT-A-CATH REMOVAL Left 12/07/2012   Procedure: REMOVAL PORT-A-CATH;  Surgeon: Scherry Ran, MD;  Location: [facility];  Service: General;  Laterality: Left;  . PORTACATH PLACEMENT  07/07/2011   Procedure: INSERTION PORT-A-CATH;  Surgeon: Scherry Ran, MD;  [facility];  Service: General;  Laterality: N/A;  . PORTACATH PLACEMENT N/A 12/07/2012   Procedure: INSERTION PORT-A-CATH;  Surgeon: Scherry Ran, MD;  Location: [facility];  Service: General;  Laterality: N/A;  Attempted portacath placement on left and right side  . WOUND DEBRIDEMENT Right 04/08/2017   Procedure: EXCISION ULCERATION RIGHT GREAT TOE;  Surgeon: Caprice Beaver, DPM;  Location: [facility];  Service: Podiatry;   Laterality: Right;  . WRIST SURGERY     Left; removal of bone fragment     reports that he quit smoking about 16 years ago. His smoking use included cigarettes and cigars. He has a 0.25 pack-year smoking history. he has never used smokeless tobacco. He reports that he does not drink alcohol or use drugs.  No Known Allergies  Family History  Problem Relation Age of Onset  . Heart disease Mother   . Cancer Mother   . Diabetes Father   . Arthritis Unknown   . Anesthesia problems Neg Hx   . Hypotension Neg Hx   . Malignant hyperthermia Neg Hx   . Pseudochol deficiency Neg Hx     Prior to Admission medications   Medication Sig Start Date End Date Taking? Authorizing Provider  acyclovir (ZOVIRAX) 400 MG tablet Take 1 tablet (400 mg total) by mouth every morning. 02/18/17  Yes Twana First, MD  allopurinol (ZYLOPRIM) 300 MG tablet TAKE ONE TABLET BY MOUTH ONCE DAILY. 05/11/17  Yes  Holley Bouche, NP  amoxicillin-clavulanate (AUGMENTIN) 500-125 MG tablet Take 1 tablet (500 mg total) by mouth 3 (three) times daily for 10 days. 08/04/17 08/14/17 Yes Lavina Hamman, MD  Artificial Tear Ointment (DRY EYES OP) Place 1 drop into both eyes as needed (dry eyes).   Yes [provider]  aspirin EC 81 MG tablet Take 81 mg by mouth daily.   Yes [provider]  atorvastatin (LIPITOR) 80 MG tablet TAKE ONE TABLET BY MOUTH AT BEDTIME. 12/18/16  Yes Herminio Commons, MD  calcitRIOL (ROCALTROL) 0.25 MCG capsule Take 0.25 mcg by mouth 3 (three) times a week. Monday, Wednesday, Friday. 12/14/13  Yes [provider]  Calcium Carbonate (CALCIUM 600 PO) Take 1 tablet by mouth 2 (two) times daily.   Yes [provider]  Calcium Carbonate-Vit D-Min (CALCIUM 1200 PO) Take 1 tablet by mouth 2 (two) times daily.   Yes [provider]  dexlansoprazole (DEXILANT) 60 MG capsule Take 60 mg by mouth every morning.   Yes [provider]  dicyclomine (BENTYL) 20 MG  tablet TAKE 1 TABLET BY MOUTH BEFORE MEALS 3 TIMES DAILY. 05/18/17  Yes Rehman, Mechele Dawley, MD  Eluxadoline (VIBERZI) 100 MG TABS Take 100 mg 2 (two) times daily with a meal by mouth.    Yes [provider]  gabapentin (NEURONTIN) 300 MG capsule Take 1 capsule (300 mg total) by mouth 2 (two) times daily. 04/22/17  Yes Short, Noah Delaine, MD  insulin detemir (LEVEMIR) 100 UNIT/ML injection Inject 0.1 mLs (10 Units total) into the skin every morning. 04/22/17  Yes Short, Noah Delaine, MD  insulin lispro (HUMALOG) 100 UNIT/ML injection Inject 0-0.1 mLs (0-10 Units total) into the skin 2 (two) times daily with a meal. Sliding Scale per patient  CBG 150-200: 1 units CBG 201-275:  3 units CBG 275-350: 5 units 351-425: 7 units 425-500: 9 units 500+: 10 units 04/22/17  Yes Short, Mackenzie, MD  loratadine (CLARITIN) 10 MG tablet Take 10 mg by mouth every morning.    Yes [provider]  Magnesium Cl-Calcium Carbonate (SLOW-MAG PO) Take 1 tablet by mouth every morning.   Yes [provider]  methocarbamol (ROBAXIN) 500 MG tablet Take 1 tablet (500 mg total) by mouth every 8 (eight) hours as needed for muscle spasms. 08/04/17  Yes Lavina Hamman, MD  metoprolol succinate (TOPROL-XL) 25 MG 24 hr tablet TAKE (1) TABLET BY MOUTH ONCE DAILY. 07/06/17  Yes Herminio Commons, MD  Multiple Vitamins-Minerals (MULTIVITAMINS THER. W/MINERALS) TABS Take 1 tablet by mouth daily.    Yes [provider]  nitroGLYCERIN (NITROSTAT) 0.4 MG SL tablet DISSOLVE 1 TABLET UNDER TONGUE EVERY 5 MINUTES UP TO 15 MIN FOR CHESTPAIN. IF NO RELIEF CALL 911. 05/11/17  Yes Holley Bouche, NP  potassium chloride SA (K-DUR,KLOR-CON) 20 MEQ tablet Take 1 tablet (20 mEq total) by mouth daily. 08/05/17  Yes Lavina Hamman, MD  sulfamethoxazole-trimethoprim (BACTRIM DS,SEPTRA DS) 800-160 MG tablet TAKE ONE TABLET BY MOUTH EVERY MONDAY, WEDNESDAY, AND FRIDAY. 06/12/17  Yes Holley Bouche, NP  torsemide  (DEMADEX) 20 MG tablet Take 1 tablet (20 mg total) by mouth daily. Take extra 1 tab for weight gain of 3 lbs in 1 day or 5 lbs in 2 days 08/05/17  Yes Lavina Hamman, MD  traMADol (ULTRAM) 50 MG tablet Take 1 tablet (50 mg total) by mouth every 6 (six) hours as needed. Patient taking differently: Take 50 mg by mouth every 6 (six)  hours as needed for moderate pain.  08/11/17  Yes Milton Ferguson, MD  Vitamin D, Ergocalciferol, (DRISDOL) 50000 units CAPS capsule TAKE 1 CAPSULE BY MOUTH EVERY THIRTY DAYS. 05/11/17  Yes Holley Bouche, NP  XARELTO 20 MG TABS tablet TAKE 1 TABLET BY MOUTH DAILY. Patient taking differently: TAKE 1 TABLET BY MOUTH EVERY DAY IN THE MORNING 09/02/16  Yes Herminio Commons, MD    Physical Exam: Vitals:   08/13/17 1300 08/13/17 1515 08/13/17 1600 08/13/17 1630  BP: (!) 141/98 130/68 139/67 119/80  Pulse: 63 63 (!) 54 (!) 58  Resp: 16 15 (!) 26 14  Temp:      TempSrc:      SpO2: 97% 95% 95% 96%  Weight:      Height:          Constitutional: NAD, calm, comfortable morbidly obese Vitals:   08/13/17 1300 08/13/17 1515 08/13/17 1600 08/13/17 1630  BP: (!) 141/98 130/68 139/67 119/80  Pulse: 63 63 (!) 54 (!) 58  Resp: 16 15 (!) 26 14  Temp:      TempSrc:      SpO2: 97% 95% 95% 96%  Weight:      Height:       Eyes: PERRL, lids and conjunctivae normal ENMT: Mucous membranes are moist. Posterior pharynx clear of any exudate or lesions.Normal dentition.  Neck: normal, supple, no masses, no thyromegaly Respiratory: clear to auscultation bilaterally, no wheezing, no crackles. Normal respiratory effort. No accessory muscle use.  Cardiovascular: Regular rate and rhythm, no murmurs / rubs / gallops. No extremity edema. 2+ pedal pulses. No carotid bruits.  Abdomen: no tenderness, no masses palpated. No hepatosplenomegaly. Bowel sounds positive.  Musculoskeletal: no clubbing / cyanosis. No joint deformity upper and lower extremities. Good ROM, no contractures. Normal  muscle tone.  Skin: no rashes, lesions, ulcers. No induration Neurologic: CN 2-12 grossly intact. Sensation intact, DTR normal. Strength 5/5 in all 4.  Psychiatric: Normal judgment and insight. Alert and oriented x 3. Normal mood.    Labs on Admission: I have personally reviewed following labs and imaging studies  CBC: Recent Labs  Lab 08/11/17 0853 08/13/17 1309  WBC 11.0* 9.8  NEUTROABS  --  7.6  HGB 9.1* 8.8*  HCT 29.3* 28.0*  MCV 95.4 95.2  PLT 230 675   Basic Metabolic Panel: Recent Labs  Lab 08/11/17 0853 08/13/17 1309  NA 138 135  K 4.2 4.4  CL 104 102  CO2 24 25  GLUCOSE 203* 216*  BUN 33* 19  CREATININE 1.58* 1.56*  CALCIUM 8.2* 8.6*   GFR: Estimated Creatinine Clearance: 86.4 mL/min (A) (by C-G formula based on SCr of 1.56 mg/dL (H)). Liver Function Tests: Recent Labs  Lab 08/13/17 1309  AST 15  ALT 20  ALKPHOS 82  BILITOT 0.6  PROT 5.7*  ALBUMIN 2.5*   No results for input(s): LIPASE, AMYLASE in the last 168 hours. No results for input(s): AMMONIA in the last 168 hours. Coagulation Profile: No results for input(s): INR, PROTIME in the last 168 hours. Cardiac Enzymes: Recent Labs  Lab 08/11/17 0853 08/13/17 1309  TROPONINI <0.03 0.03*   BNP (last 3 results) No results for input(s): PROBNP in the last 8760 hours. HbA1C: No results for input(s): HGBA1C in the last 72 hours. CBG: No results for input(s): GLUCAP in the last 168 hours. Lipid Profile: No results for input(s): CHOL, HDL, LDLCALC, TRIG, CHOLHDL, LDLDIRECT in the last 72 hours. Thyroid Function Tests: No results for input(s):  TSH, T4TOTAL, FREET4, T3FREE, THYROIDAB in the last 72 hours. Anemia Panel: No results for input(s): VITAMINB12, FOLATE, FERRITIN, TIBC, IRON, RETICCTPCT in the last 72 hours. Urine analysis:    Component Value Date/Time   COLORURINE YELLOW 08/13/2017 1309   APPEARANCEUR CLOUDY (A) 08/13/2017 1309   LABSPEC 1.005 08/13/2017 1309   PHURINE 5.0  08/13/2017 1309   GLUCOSEU NEGATIVE 08/13/2017 1309   HGBUR LARGE (A) 08/13/2017 1309   BILIRUBINUR NEGATIVE 08/13/2017 1309   KETONESUR NEGATIVE 08/13/2017 1309   PROTEINUR 30 (A) 08/13/2017 1309   UROBILINOGEN 0.2 10/03/2012 1809   NITRITE NEGATIVE 08/13/2017 1309   LEUKOCYTESUR LARGE (A) 08/13/2017 1309   Sepsis Labs: !!!!!!!!!!!!!!!!!!!!!!!!!!!!!!!!!!!!!!!!!!!! '@[email or web address]' (procalcitonin:4,lacticidven:4) )No results found for this or any previous visit (from the past 240 hour(s)).   Radiological Exams on Admission: Dg Chest Port 1 View  Result Date: 08/13/2017 CLINICAL DATA:  57 y/o  M; chest pain. EXAM: PORTABLE CHEST 1 VIEW COMPARISON:  08/11/2017 chest radiograph FINDINGS: Stable cardiomegaly and left hemidiaphragm eventration. Right port catheter tip projects over lower SVC. Increased pulmonary vascular congestion and probable interstitial edema. Bones are unremarkable. IMPRESSION: Increased pulmonary vascular congestion and probable interstitial edema. Stable cardiomegaly. Electronically Signed   By: Kristine Garbe M.D.   On: 08/13/2017 13:31    EKG: Independently reviewed.  A flutter with no acute changes Chest x-ray reviewed mild edema no infiltrate Old chart reviewed Case discussed with EDP  Assessment/Plan 57 year old male with generalized weakness, atypical chest pain and hematochezia  Principal Problem:   Chest pain-serial troponin.  Obtain cardiac echo in the morning.  Aspirin.  Nonischemic EKG and initial negative troponin.  Active Problems:   Generalized weakness-obtain physical therapy evaluation.  Ensure that physical therapy is being arranged for him at home he is not been evaluated yet at home with home health.   Hematochezia-rectal exam negative in the ED by Dr. Sabra Heck.  No overt bleeding at this time.  Instructed patient to let us know if he has any further bleeding.  Repeat hemoglobin in the morning.   Insulin dependent diabetes mellitus  (HCC)-sliding scale insulin   Morbid obesity (HCC)-noted   Multiple myeloma (HCC)-noted   Atrial flutter (HCC)-rate controlled   Chronic systolic heart failure (HCC)-stable at this time   CKD (chronic kidney disease)-stable at his baseline of creatinine 1.5   Normocytic anemia-check anemia panel     DVT prophylaxis: SCDs Code Status: Full Family Communication: Brother Disposition Plan: Per day team Consults called: None Admission status: Observation   [name] A MD [facility]  If 7PM-7AM, please contact night-coverage [email or web address] Password [facility]  08/13/2017, 4:56 PM

## 2017-08-13 NOTE — ED Provider Notes (Signed)
[facility] EMERGENCY DEPARTMENT Provider Note   CSN: [id number] Arrival date & time: 08/13/17  1112     History   Chief Complaint Chief Complaint  Patient presents with  . Weakness    HPI [name] is a 57 y.o. male.  HPI  The patient is a 57 year old male, he has multiple medical problems including insulin requiring diabetes, morbid obesity, hypertension, hyperlipidemia, a normocytic anemia, chronic congestive heart failure, history of sepsis which she was recently admitted to the hospital on February 28 and stayed until March 5.  The patient is chronically debilitated, walks with a walker, he has an indwelling Foley catheter, he was recently admitted for what was thought to be pyelonephritis, he had kidney stones, stent was placed, antibiotics were given and he was discharged home.    He was noted to have Klebsiella bacteremia, transitioned to Augmentin for 2 weeks on discharge which she has been taking  The urology notes show that he had a right ureteropelvic junction stone, he underwent cystoscopy, retrograde pyelography, intraoperative fluoroscopy and a right double-J stent placement.  The plan was to have the stone extracted within 2 weeks.  The patient returns today stating that yesterday he saw blood in his stool, he reports that it was a light red mixed in with his stool, this morning it came out more and the friend or caregiver who is here with the patient states that there was actually blood on the bed sheets this morning.  The patient was going to come to the hospital because of generalized weakness but as he tried to get to the door his legs became severely weak and he fell to the ground requiring paramedic assistance to get up to the couch.  After he was helped to the couch because of the way that the paramedics had to get him off the ground he started to develop right-sided chest pain.  He reports it is worse with movement and palpation.  There is no shortness of  breath, no fevers, he denies any pain in his jaw neck or back.  He has chronic swelling of his lower extremities.  The weakness has been progressive, gradually worse this morning, it was bad enough that he could not get up off the ground and it is not associated with fevers.  He has not noticed any change in the urine in his indwelling catheter.  Past Medical History:  Diagnosis Date  . Anemia   . Arteriosclerotic cardiovascular disease (ASCVD)    MI-2000s; stent to the proximal LAD and diagonal in 2001; stress nuclear in 2008-impaired exercise capacity, left ventricular dilatation, moderately to severely depressed EF, apical, inferior and anteroseptal scar  . Arthritis   . Atrial flutter ([place])   . 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 Cellulitis of leg    both legs  . Chronic diarrhea   . Chronic kidney disease, stage 3, mod decreased GFR (HCC)    Creatinine of 1.84 in 06/2011 and 1.5 in 07/2011  . Diabetes mellitus    Insulin  . Dysrhythmia    AFlutter  . GERD (gastroesophageal reflux disease)   . Gout   . Hyperlipidemia   . Hypertension   . Injection site reaction   . Multiple myeloma 07/01/2011  . Myocardial infarction ([place]) 2000  . Obesity   . Pedal edema    Venous insufficiency  . Sleep apnea    uses cpap  . Ulcer     Patient Active Problem List   Diagnosis Date Noted  .  Pressure injury of skin 08/04/2017  . Sepsis secondary to UTI ([place]) 07/31/2017  . AKI (acute kidney injury) ([place]) 07/31/2017  . Sepsis ([place]) 07/31/2017  . Fournier gangrene 04/14/2017  . Epididymitis 04/13/2017  . Acute epididymitis 04/13/2017  . Pneumonia 06/10/2016  . CAP (community acquired pneumonia) 06/10/2016  . Cellulitis of left lower extremity   . Hypogammaglobulinemia ([place]) 03/09/2016  . Hypokalemia 03/09/2016  . Diaphragmatic hernia without obstruction 02/26/2016  . Chronic systolic heart failure ([place]) 12/19/2013  . Anemia, normocytic normochromic 10/09/2012  . Chronic pancreatitis  ([place]) 10/07/2012  . Atrial flutter ([place]) 09/17/2012  . DDD (degenerative disc disease), cervical 03/18/2012  . Arteriosclerotic cardiovascular disease (ASCVD)   . Hypertension   . Hyperlipidemia   . Multiple myeloma ([place]) 07/01/2011  . Morbid obesity ([place]) 04/29/2010  . Insulin dependent diabetes mellitus ([place]) 11/15/2008  . Sleep apnea 11/15/2008    Past Surgical History:  Procedure Laterality Date  . ABSCESS DRAINAGE     Scrotal  . BIOPSY  01/02/2012   Procedure: BIOPSY;  Surgeon: Rogene Houston, MD;  Location: [facility];  Service: Endoscopy;  Laterality: N/A;  . BONE MARROW BIOPSY  05/13/11  . CARDIAC CATHETERIZATION     cardiac stent  . CARDIOVERSION N/A 10/13/2012   Procedure: CARDIOVERSION;  Surgeon: Yehuda Savannah, MD;  Location: [facility] ORS;  Service: Cardiovascular;  Laterality: N/A;  . CATARACT EXTRACTION W/PHACO Left 02/13/2014   Procedure: CATARACT EXTRACTION PHACO AND INTRAOCULAR LENS PLACEMENT ([place]);  Surgeon: Tonny Branch, MD;  Location: [facility] ORS;  Service: Ophthalmology;  Laterality: Left;  CDE:  7.67  . CATARACT EXTRACTION W/PHACO Right 03/02/2014   Procedure: CATARACT EXTRACTION PHACO AND INTRAOCULAR LENS PLACEMENT RIGHT EYE CDE=16.81;  Surgeon: Tonny Branch, MD;  Location: [facility];  Service: Ophthalmology;  Laterality: Right;  . COLONOSCOPY  11/28/2011   Procedure: COLONOSCOPY;  Surgeon: Rogene Houston, MD;  [facility];  Service: Endoscopy;  Laterality: N/A;  930  . CYSTOSCOPY W/ URETERAL STENT PLACEMENT Right 07/31/2017   Procedure: CYSTOSCOPY WITH RETROGRADE PYELOGRAM/URETERAL STENT PLACEMENT;  Surgeon: Cleon Gustin, MD;  Location: [facility];  Service: Urology;  Laterality: Right;  . ESOPHAGOGASTRODUODENOSCOPY  01/02/2012   Procedure: ESOPHAGOGASTRODUODENOSCOPY (EGD);  Surgeon: Rogene Houston, MD;  [facility];  Service: Endoscopy;  Laterality: N/A;  100  . ESOPHAGOGASTRODUODENOSCOPY N/A 09/20/2012   Procedure: ESOPHAGOGASTRODUODENOSCOPY (EGD);   Surgeon: Rogene Houston, MD;  [facility];  Service: Endoscopy;  Laterality: N/A;  . EUS N/A 10/07/2012   Procedure: UPPER ENDOSCOPIC ULTRASOUND (EUS) LINEAR;  Surgeon: Milus Banister, MD;  [facility];  Service: Endoscopy;  Laterality: N/A;  . INCISION AND DRAINAGE ABSCESS N/A 04/14/2017   Procedure: INCISION AND DRAINAGE ABSCESS;  Surgeon: Ceasar Mons, MD;  [facility]: [facility];  Service: Urology;  Laterality: N/A;  . INCISION AND DRAINAGE ABSCESS ANAL    . IRRIGATION AND DEBRIDEMENT ABSCESS N/A 04/15/2017   Procedure: DEBRIDEMENT SCROTAL WOUND AND DRESSING CHANGE;  Surgeon: Ceasar Mons, MD;  Location: [facility];  Service: Urology;  Laterality: N/A;  . IRRIGATION AND DEBRIDEMENT ABSCESS N/A 04/17/2017   Procedure: IRRIGATION AND DEBRIDEMENT ABSCESS;  Surgeon: Ceasar Mons, MD;  [facility]: [facility];  Service: Urology;  Laterality: N/A;  RM 3  . LAPAROSCOPIC GASTRIC BANDING  2006   has been removed  . OSTECTOMY Right 04/08/2017   Procedure: OSTECTOMY RIGHT GREAT TOE;  Surgeon: Caprice Beaver, DPM;  Location: [facility];  Service: Podiatry;  Laterality: Right;  . PORT-A-CATH REMOVAL Left 12/07/2012   Procedure: REMOVAL PORT-A-CATH;  Surgeon: Scherry Ran, MD;  Location: [facility];  Service: General;  Laterality: Left;  . PORTACATH PLACEMENT  07/07/2011   Procedure: INSERTION PORT-A-CATH;  Surgeon: Scherry Ran, MD;  Location: [facility];  Service: General;  Laterality: N/A;  . PORTACATH PLACEMENT N/A 12/07/2012   Procedure: INSERTION PORT-A-CATH;  Surgeon: Scherry Ran, MD;  Location: [facility];  Service: General;  Laterality: N/A;  Attempted portacath placement on left and right side  . WOUND DEBRIDEMENT Right 04/08/2017   Procedure: EXCISION ULCERATION RIGHT GREAT TOE;  Surgeon: Caprice Beaver, DPM;  Location: [facility];  Service: Podiatry;  Laterality: Right;  . WRIST SURGERY     Left; removal of bone fragment       Home  Medications    Prior to Admission medications   Medication Sig Start Date End Date Taking? Authorizing Provider  acyclovir (ZOVIRAX) 400 MG tablet Take 1 tablet (400 mg total) by mouth every morning. 02/18/17  Yes Twana First, MD  allopurinol (ZYLOPRIM) 300 MG tablet TAKE ONE TABLET BY MOUTH ONCE DAILY. 05/11/17  Yes Holley Bouche, NP  amoxicillin-clavulanate (AUGMENTIN) 500-125 MG tablet Take 1 tablet (500 mg total) by mouth 3 (three) times daily for 10 days. 08/04/17 08/14/17 Yes Lavina Hamman, MD  Artificial Tear Ointment (DRY EYES OP) Place 1 drop into both eyes as needed (dry eyes).   Yes [provider]  aspirin EC 81 MG tablet Take 81 mg by mouth daily.   Yes [provider]  atorvastatin (LIPITOR) 80 MG tablet TAKE ONE TABLET BY MOUTH AT BEDTIME. 12/18/16  Yes Herminio Commons, MD  calcitRIOL (ROCALTROL) 0.25 MCG capsule Take 0.25 mcg by mouth 3 (three) times a week. Monday, Wednesday, Friday. 12/14/13  Yes [provider]  Calcium Carbonate (CALCIUM 600 PO) Take 1 tablet by mouth 2 (two) times daily.   Yes [provider]  Calcium Carbonate-Vit D-Min (CALCIUM 1200 PO) Take 1 tablet by mouth 2 (two) times daily.   Yes [provider]  dexlansoprazole (DEXILANT) 60 MG capsule Take 60 mg by mouth every morning.   Yes [provider]  dicyclomine (BENTYL) 20 MG tablet TAKE 1 TABLET BY MOUTH BEFORE MEALS 3 TIMES DAILY. 05/18/17  Yes Rehman, Mechele Dawley, MD  Eluxadoline (VIBERZI) 100 MG TABS Take 100 mg 2 (two) times daily with a meal by mouth.    Yes [provider]  gabapentin (NEURONTIN) 300 MG capsule Take 1 capsule (300 mg total) by mouth 2 (two) times daily. 04/22/17  Yes Short, Noah Delaine, MD  insulin detemir (LEVEMIR) 100 UNIT/ML injection Inject 0.1 mLs (10 Units total) into the skin every morning. 04/22/17  Yes Short, Noah Delaine, MD  insulin lispro (HUMALOG) 100 UNIT/ML injection Inject 0-0.1 mLs (0-10 Units total) into  the skin 2 (two) times daily with a meal. Sliding Scale per patient  CBG 150-200: 1 units CBG 201-275:  3 units CBG 275-350: 5 units 351-425: 7 units 425-500: 9 units 500+: 10 units 04/22/17  Yes Short, Mackenzie, MD  loratadine (CLARITIN) 10 MG tablet Take 10 mg by mouth every morning.    Yes [provider]  Magnesium Cl-Calcium Carbonate (SLOW-MAG PO) Take 1 tablet by mouth every morning.   Yes [provider]  methocarbamol (ROBAXIN) 500 MG tablet Take 1 tablet (500 mg total) by mouth every 8 (eight) hours as needed for muscle spasms. 08/04/17  Yes Lavina Hamman,  MD  metoprolol succinate (TOPROL-XL) 25 MG 24 hr tablet TAKE (1) TABLET BY MOUTH ONCE DAILY. 07/06/17  Yes Herminio Commons, MD  Multiple Vitamins-Minerals (MULTIVITAMINS THER. W/MINERALS) TABS Take 1 tablet by mouth daily.    Yes [provider]  nitroGLYCERIN (NITROSTAT) 0.4 MG SL tablet DISSOLVE 1 TABLET UNDER TONGUE EVERY 5 MINUTES UP TO 15 MIN FOR CHESTPAIN. IF NO RELIEF CALL 911. 05/11/17  Yes Holley Bouche, NP  potassium chloride SA (K-DUR,KLOR-CON) 20 MEQ tablet Take 1 tablet (20 mEq total) by mouth daily. 08/05/17  Yes Lavina Hamman, MD  sulfamethoxazole-trimethoprim (BACTRIM DS,SEPTRA DS) 800-160 MG tablet TAKE ONE TABLET BY MOUTH EVERY MONDAY, WEDNESDAY, AND FRIDAY. 06/12/17  Yes Holley Bouche, NP  torsemide (DEMADEX) 20 MG tablet Take 1 tablet (20 mg total) by mouth daily. Take extra 1 tab for weight gain of 3 lbs in 1 day or 5 lbs in 2 days 08/05/17  Yes Lavina Hamman, MD  traMADol (ULTRAM) 50 MG tablet Take 1 tablet (50 mg total) by mouth every 6 (six) hours as needed. Patient taking differently: Take 50 mg by mouth every 6 (six) hours as needed for moderate pain.  08/11/17  Yes Milton Ferguson, MD  Vitamin D, Ergocalciferol, (DRISDOL) 50000 units CAPS capsule TAKE 1 CAPSULE BY MOUTH EVERY THIRTY DAYS. 05/11/17  Yes Holley Bouche, NP  XARELTO 20 MG TABS tablet TAKE 1 TABLET BY  MOUTH DAILY. Patient taking differently: TAKE 1 TABLET BY MOUTH EVERY DAY IN THE MORNING 09/02/16  Yes Herminio Commons, MD    Family History Family History  Problem Relation Age of Onset  . Heart disease Mother   . Cancer Mother   . Diabetes Father   . Arthritis Unknown   . Anesthesia problems Neg Hx   . Hypotension Neg Hx   . Malignant hyperthermia Neg Hx   . Pseudochol deficiency Neg Hx     Social History Social History   Tobacco Use  . Smoking status: Former Smoker    Packs/day: 0.25    Years: 1.00    Pack years: 0.25    Types: Cigarettes, Cigars    Last attempt to quit: 05/17/2001    Years since quitting: 16.2  . Smokeless tobacco: Never Used  Substance Use Topics  . Alcohol use: No    Alcohol/week: 0.0 oz  . Drug use: No     Allergies   Patient has no known allergies.   Review of Systems Review of Systems  All other systems reviewed and are negative.    Physical Exam Updated Vital Signs BP 130/68   Pulse 63   Temp 98.9 F (37.2 C) (Oral)   Resp 15   Ht '6\' 2"'$  (1.88 m)   Wt (!) 172.8 kg (381 lb)   SpO2 95%   BMI 48.92 kg/m   Physical Exam  Constitutional: He appears well-developed and well-nourished. No distress.  HENT:  Head: Normocephalic and atraumatic.  Mouth/Throat: Oropharynx is clear and moist. No oropharyngeal exudate.  Eyes: Conjunctivae and EOM are normal. Pupils are equal, round, and reactive to light. Right eye exhibits no discharge. Left eye exhibits no discharge. No scleral icterus.  Neck: Normal range of motion. Neck supple. No JVD present. No thyromegaly present.  Cardiovascular: Normal rate, regular rhythm, normal heart sounds and intact distal pulses. Exam reveals no gallop and no friction rub.  No murmur heard. Pulmonary/Chest: Effort normal and breath sounds normal. No respiratory distress. He has no wheezes. He has  no rales. He exhibits tenderness ( There is bilateral chest tenderness over the bilateral shoulders upper  PACs lower PACs and mid sternum.).  Abdominal: Soft. Bowel sounds are normal. He exhibits no distension and no mass. There is no tenderness.  Musculoskeletal: Normal range of motion. He exhibits edema ( Bilateral lower extremity edema). He exhibits no tenderness.  Lymphadenopathy:    He has no cervical adenopathy.  Neurological: He is alert. Coordination normal.  The patient is able to move all 4 extremities, he appears generally weak but not focally weak cranial nerves III through XII appear normal  Skin: Skin is warm and dry. No rash noted. No erythema.  There is some bruising about the left shoulder, there is no other significant skin breakdown or bruising  Psychiatric: He has a normal mood and affect. His behavior is normal.  Nursing note and vitals reviewed.    ED Treatments / Results  Labs (all labs ordered are listed, but only abnormal results are displayed) Labs Reviewed  CBC WITH DIFFERENTIAL/PLATELET - Abnormal; Notable for the following components:      Result Value   RBC 2.94 (*)    Hemoglobin 8.8 (*)    HCT 28.0 (*)    RDW 15.7 (*)    All other components within normal limits  COMPREHENSIVE METABOLIC PANEL - Abnormal; Notable for the following components:   Glucose, Bld 216 (*)    Creatinine, Ser 1.56 (*)    Calcium 8.6 (*)    Total Protein 5.7 (*)    Albumin 2.5 (*)    GFR calc non Af Amer 47 (*)    GFR calc Af Amer 55 (*)    All other components within normal limits  URINALYSIS, ROUTINE W REFLEX MICROSCOPIC - Abnormal; Notable for the following components:   APPearance CLOUDY (*)    Hgb urine dipstick LARGE (*)    Protein, ur 30 (*)    Leukocytes, UA LARGE (*)    Bacteria, UA RARE (*)    Squamous Epithelial / LPF 0-5 (*)    All other components within normal limits  TROPONIN I - Abnormal; Notable for the following components:   Troponin I 0.03 (*)    All other components within normal limits  BRAIN NATRIURETIC PEPTIDE - Abnormal; Notable for the following  components:   B Natriuretic Peptide 576.0 (*)    All other components within normal limits  URINE CULTURE  OCCULT BLOOD X 1 CARD TO LAB, STOOL  POC OCCULT BLOOD, ED  TYPE AND SCREEN    EKG  EKG Interpretation  Date/Time:  Thursday August 13 2017 11:20:20 EDT Ventricular Rate:  62 PR Interval:    QRS Duration: 90 QT Interval:  476 QTC Calculation: 484 R Axis:   -7 Text Interpretation:  irregulary rhythm LOW VOLTAGE Nonspecific T wave abnormality Anteroseptal infarct, age indeterminate Confirmed by Noemi Chapel [phone]) on 08/13/2017 12:55:00 PM       Radiology Dg Chest Port 1 View  Result Date: 08/13/2017 CLINICAL DATA:  57 y/o  M; chest pain. EXAM: PORTABLE CHEST 1 VIEW COMPARISON:  08/11/2017 chest radiograph FINDINGS: Stable cardiomegaly and left hemidiaphragm eventration. Right port catheter tip projects over lower SVC. Increased pulmonary vascular congestion and probable interstitial edema. Bones are unremarkable. IMPRESSION: Increased pulmonary vascular congestion and probable interstitial edema. Stable cardiomegaly. Electronically Signed   By: Kristine Garbe M.D.   On: 08/13/2017 13:31    Procedures Procedures (including critical care time)  Medications Ordered in ED Medications  nitroGLYCERIN (NITROSTAT)  SL tablet 0.4 mg (not administered)  HYDROcodone-acetaminophen (NORCO/VICODIN) 5-325 MG per tablet 2 tablet (2 tablets Oral Given 08/13/17 1520)  aspirin chewable tablet 324 mg (324 mg Oral Given 08/13/17 1532)  furosemide (LASIX) injection 40 mg (40 mg Intravenous Given 08/13/17 1533)     Initial Impression / Assessment and Plan / ED Course  I have reviewed the triage vital signs and the nursing notes.  Pertinent labs & imaging results that were available during my care of the patient were reviewed by me and considered in my medical decision making (see chart for details).  Clinical Course as of Aug 14 1551  Thu Aug 13, 2017  1522 WBC: 9.8 [BM]  1522  Hemoglobin: (!) 8.8 [BM]  1522 Creatinine: (!) 1.56 [BM]  1522 B Natriuretic Peptide: (!) 576.0 [BM]  1522 Labs reviewed, no leukocytosis, mild chronic anemia, mild chronic renal insufficiency, elevated BNP and an elevated troponin at 0.03.  As the patient has chest pain, weakness and a measurable troponin with his history of heart disease it would be reasonable to keep the patient in the hospital overnight on observation to make sure he is not having active ischemia.  His urinalysis does reveal too numerous to count white and red blood cells but only rare bacteria and knowing that he is taking an antibiotic to which the prior infection was pansensitive seems unlikely that this is a new infection.  That being said with his history of pyelonephritis with large stone, will need overnight observational stay. Troponin I: (!!) 0.03 [BM]  1524 Increased vascular congestion and pulmonary edema, Lasix will be given, likely has some element of congestive heart failure today as well  [BM]  1552 Discussed with hospitalist who will admit.  [BM]    Clinical Course User Index [BM] Noemi Chapel, MD   The patient has a known ischemic cardiomyopathy, his last ejection fraction was in the 35% range, he is also known to have a prior MI with stent placement.  His arrhythmias in the past of range from atrial flutter, EKGs have been reviewed going back years and there does appear to be a fairly consistent atrial arrhythmia.  Today he has a narrow complex borderline bradycardia, I am concerned that his symptoms could all be related to blood in the stools as he is on Xarelto, he has a known history of chronic anemia which could be worse.  This could also be related to more of a cardiac ischemia though he really has not had any more chest pain than usual.  The chest pain that he has had over the last week it seems to be related to the fall that he had.  Will pursue further workup with labs, urinalysis, chest x-ray, EKG.  He  will need a rectal exam with Hemoccult as well.  Final Clinical Impressions(s) / ED Diagnoses   Final diagnoses:  Generalized weakness  Elevated troponin  Chronic anemia      Noemi Chapel, MD 08/13/17 1553

## 2017-08-13 NOTE — ED Notes (Signed)
Date and time results received: 08/13/17 3:04 PM   Test: Troponin  Critical Value: 0.03  Name of Provider Notified: Sabra Heck  Orders Received? Or Actions Taken?: no new orders at this time.

## 2017-08-13 NOTE — ED Triage Notes (Signed)
Pt reports he was coming to the hospital for "urine in my stool."  States he became so weak he was unable to walk and had to call the rescue squad.  Also c/o pain in chest which has been there for about 1 week and was dx with muscle pain.  Pain has not resolved since last visit and the pain meds prescribed are not working.

## 2017-08-14 ENCOUNTER — Other Ambulatory Visit: Payer: Self-pay | Admitting: Internal Medicine

## 2017-08-14 DIAGNOSIS — R079 Chest pain, unspecified: Secondary | ICD-10-CM

## 2017-08-14 DIAGNOSIS — G4730 Sleep apnea, unspecified: Secondary | ICD-10-CM

## 2017-08-14 LAB — IRON AND TIBC
Iron: 11 ug/dL — ABNORMAL LOW (ref 45–182)
Saturation Ratios: 5 % — ABNORMAL LOW (ref 17.9–39.5)
TIBC: 216 ug/dL — ABNORMAL LOW (ref 250–450)
UIBC: 205 ug/dL

## 2017-08-14 LAB — BASIC METABOLIC PANEL
Anion gap: 11 (ref 5–15)
BUN: 17 mg/dL (ref 6–20)
CO2: 25 mmol/L (ref 22–32)
Calcium: 8.3 mg/dL — ABNORMAL LOW (ref 8.9–10.3)
Chloride: 101 mmol/L (ref 101–111)
Creatinine, Ser: 1.54 mg/dL — ABNORMAL HIGH (ref 0.61–1.24)
GFR calc Af Amer: 56 mL/min — ABNORMAL LOW (ref >60)
GFR calc non Af Amer: 48 mL/min — ABNORMAL LOW (ref >60)
Glucose, Bld: 159 mg/dL — ABNORMAL HIGH (ref 65–99)
Potassium: 4.1 mmol/L (ref 3.5–5.1)
Sodium: 137 mmol/L (ref 135–145)

## 2017-08-14 LAB — GLUCOSE, CAPILLARY
Glucose-Capillary: 153 mg/dL — ABNORMAL HIGH (ref 65–99)
Glucose-Capillary: 173 mg/dL — ABNORMAL HIGH (ref 65–99)
Glucose-Capillary: 172 mg/dL — ABNORMAL HIGH (ref 65–99)

## 2017-08-14 LAB — FERRITIN: Ferritin: 144 ng/mL (ref 24–336)

## 2017-08-14 LAB — CBC
HCT: 26.8 % — ABNORMAL LOW (ref 39.0–52.0)
Hemoglobin: 8.4 g/dL — ABNORMAL LOW (ref 13.0–17.0)
MCH: 29.9 pg (ref 26.0–34.0)
MCHC: 31.3 g/dL (ref 30.0–36.0)
MCV: 95.4 fL (ref 78.0–100.0)
Platelets: 204 10*3/uL (ref 150–400)
RBC: 2.81 MIL/uL — ABNORMAL LOW (ref 4.22–5.81)
RDW: 15.7 % — ABNORMAL HIGH (ref 11.5–15.5)
WBC: 7.5 10*3/uL (ref 4.0–10.5)

## 2017-08-14 LAB — TROPONIN I: Troponin I: 0.03 ng/mL (ref <0.03)

## 2017-08-14 LAB — FOLATE: Folate: 25 ng/mL (ref >5.9)

## 2017-08-14 LAB — VITAMIN B12: Vitamin B-12: 420 pg/mL (ref 180–914)

## 2017-08-14 MED ORDER — METHOCARBAMOL 500 MG PO TABS
500.0000 mg | ORAL_TABLET | Freq: Three times a day (TID) | ORAL | Status: DC
  Administered 2017-08-14 (×2): 500 mg via ORAL
  Filled 2017-08-14 (×2): qty 1

## 2017-08-14 MED ORDER — DICLOFENAC SODIUM 1 % TD GEL
2.0000 g | Freq: Four times a day (QID) | TRANSDERMAL | Status: DC
  Administered 2017-08-14 (×2): 2 g via TOPICAL
  Filled 2017-08-14: qty 100

## 2017-08-14 MED ORDER — HYDROCODONE-ACETAMINOPHEN 5-325 MG PO TABS
2.0000 | ORAL_TABLET | Freq: Four times a day (QID) | ORAL | Status: DC
  Administered 2017-08-14: 2 via ORAL
  Filled 2017-08-14: qty 2

## 2017-08-14 MED ORDER — HYDROCODONE-ACETAMINOPHEN 10-325 MG PO TABS: 1.0000 | ORAL_TABLET | Freq: Four times a day (QID) | ORAL | 0 refills | Status: AC | PRN

## 2017-08-14 MED ORDER — DICLOFENAC SODIUM 1 % TD GEL
4.0000 g | Freq: Four times a day (QID) | TRANSDERMAL | Status: DC
  Filled 2017-08-14: qty 100

## 2017-08-14 MED ORDER — HEPARIN SOD (PORK) LOCK FLUSH 100 UNIT/ML IV SOLN
500.0000 [IU] | INTRAVENOUS | Status: AC | PRN
  Administered 2017-08-14: 500 [IU]
  Filled 2017-08-14: qty 5

## 2017-08-14 NOTE — Care Management (Signed)
Patient active with [facility]. Tim of [facility] aware patient is here. Patient is in Observation status and does not need resumption orders.

## 2017-08-14 NOTE — Discharge Summary (Signed)
Physician Discharge Summary  TENG DECOU QPR:[id number] DOB: 1960-10-06 DOA: 08/13/2017  PCP: [name], MD  Admit date: 08/13/2017  Discharge date: 08/14/2017  Admitted From:Home  Disposition:  Home with home health  Recommendations for Outpatient Follow-up:  1. Follow up with PCP in 1-2 weeks 2. Follow up with Dr. Wandra Scot on 3/28 as scheduled  Home Health:Yes; already established  Equipment/Devices:N/A  Discharge Condition:Stable  CODE STATUS: Full  Diet recommendation: Heart Healthy  Brief/Interim Summary:  [name] is a 57 y.o. male with medical history significant of obesity, recent sepsis from kidney stone status post stenting, chronic kidney disease, multiple myeloma who presented with complaints of generalized weakness as well as one episode of a bloody bowel movement.  He was noted to have several falls since recent discharge and began complaining of some left sided chest pain.  He has been ruled out for ACS with negative troponins and no findings on EKG.  Telemetry overnight, however did reveal some sinus arrhythmia and episodes of bradycardia with stable blood pressure readings.  I have discussed this with cardiology who recommends discontinuing his metoprolol for the time being with close follow-up to his cardiologist Dr. Bronson Ing for which follow-up has been arranged on 3/28.  His chest wall pain has vastly improved with the use of oral Norco as well as Voltaren gel.  He will be given these prescriptions on discharge with close follow-up to his primary care physician in the next 1 week.  Will have home health physical therapy following with him as well.   Discharge Diagnoses:  Principal Problem:   Chest pain Active Problems:   Insulin dependent diabetes mellitus (HCC)   Morbid obesity (HCC)   Multiple myeloma (HCC)   Atrial flutter (HCC)   Chronic systolic heart failure (HCC)   Generalized weakness   Hematochezia   CKD (chronic kidney  disease)   Normocytic anemia  1. Chest pain.  Musculoskeletal in nature with no findings of ACS-resolved.  Continue on Norco as well as Voltaren gel which has provided immense relief. 2. Generalized weakness.  Continue home physical therapy as arranged.  Findings appear to be due to significant bradycardia for which metoprolol has been currently discontinued.  Follow-up with cardiology as noted. 3. Insulin-dependent diabetes.  Continue home insulin regimen with stable blood glucose readings noted. 4. Atrial flutter.  Will discontinue beta-blocker on account of bradycardia at this time as well as symptomatology.  Continue to monitor with follow-up to cardiology as noted. 5. Chronic systolic heart failure.  Currently stable.  Discharge Instructions  Discharge Instructions    Diet - low sodium heart healthy   Complete by:  As directed    Increase activity slowly   Complete by:  As directed      Allergies as of 08/14/2017   No Known Allergies     Medication List    STOP taking these medications   metoprolol succinate 25 MG 24 hr tablet Commonly known as:  TOPROL-XL     TAKE these medications   acyclovir 400 MG tablet Commonly known as:  ZOVIRAX Take 1 tablet (400 mg total) by mouth every morning.   allopurinol 300 MG tablet Commonly known as:  ZYLOPRIM TAKE ONE TABLET BY MOUTH ONCE DAILY.   amoxicillin-clavulanate 500-125 MG tablet Commonly known as:  AUGMENTIN Take 1 tablet (500 mg total) by mouth 3 (three) times daily for 10 days.   aspirin EC 81 MG tablet Take 81 mg by mouth daily.   atorvastatin 80 MG  tablet Commonly known as:  LIPITOR TAKE ONE TABLET BY MOUTH AT BEDTIME.   calcitRIOL 0.25 MCG capsule Commonly known as:  ROCALTROL Take 0.25 mcg by mouth 3 (three) times a week. Monday, Wednesday, Friday.   CALCIUM 1200 PO Take 1 tablet by mouth 2 (two) times daily.   CALCIUM 600 PO Take 1 tablet by mouth 2 (two) times daily.   DEXILANT 60 MG capsule Generic  drug:  dexlansoprazole Take 60 mg by mouth every morning.   dicyclomine 20 MG tablet Commonly known as:  BENTYL TAKE 1 TABLET BY MOUTH BEFORE MEALS 3 TIMES DAILY.   DRY EYES OP Place 1 drop into both eyes as needed (dry eyes).   gabapentin 300 MG capsule Commonly known as:  NEURONTIN Take 1 capsule (300 mg total) by mouth 2 (two) times daily.   HYDROcodone-acetaminophen 10-325 MG tablet Commonly known as:  NORCO Take 1 tablet by mouth every 6 (six) hours as needed.   insulin detemir 100 UNIT/ML injection Commonly known as:  LEVEMIR Inject 0.1 mLs (10 Units total) into the skin every morning.   insulin lispro 100 UNIT/ML injection Commonly known as:  HUMALOG Inject 0-0.1 mLs (0-10 Units total) into the skin 2 (two) times daily with a meal. Sliding Scale per patient  CBG 150-200: 1 units CBG 201-275:  3 units CBG 275-350: 5 units 351-425: 7 units 425-500: 9 units 500+: 10 units   loratadine 10 MG tablet Commonly known as:  CLARITIN Take 10 mg by mouth every morning.   methocarbamol 500 MG tablet Commonly known as:  ROBAXIN Take 1 tablet (500 mg total) by mouth every 8 (eight) hours as needed for muscle spasms.   multivitamins ther. w/minerals Tabs tablet Take 1 tablet by mouth daily.   nitroGLYCERIN 0.4 MG SL tablet Commonly known as:  NITROSTAT DISSOLVE 1 TABLET UNDER TONGUE EVERY 5 MINUTES UP TO 15 MIN FOR CHESTPAIN. IF NO RELIEF CALL 911.   potassium chloride SA 20 MEQ tablet Commonly known as:  K-DUR,KLOR-CON Take 1 tablet (20 mEq total) by mouth daily.   SLOW-MAG PO Take 1 tablet by mouth every morning.   sulfamethoxazole-trimethoprim 800-160 MG tablet Commonly known as:  BACTRIM DS,SEPTRA DS TAKE ONE TABLET BY MOUTH EVERY MONDAY, WEDNESDAY, AND FRIDAY.   torsemide 20 MG tablet Commonly known as:  DEMADEX Take 1 tablet (20 mg total) by mouth daily. Take extra 1 tab for weight gain of 3 lbs in 1 day or 5 lbs in 2 days   traMADol 50 MG tablet Commonly  known as:  ULTRAM Take 1 tablet (50 mg total) by mouth every 6 (six) hours as needed. What changed:  reasons to take this   VIBERZI 100 MG Tabs Generic drug:  Eluxadoline Take 100 mg 2 (two) times daily with a meal by mouth.   Vitamin D (Ergocalciferol) 50000 units Caps capsule Commonly known as:  DRISDOL TAKE 1 CAPSULE BY MOUTH EVERY THIRTY DAYS.   XARELTO 20 MG Tabs tablet Generic drug:  rivaroxaban TAKE 1 TABLET BY MOUTH DAILY. What changed:    how much to take  how to take this  when to take this      Follow-up Information    Herminio Commons, MD Follow up on 08/27/2017.   Specialty:  Cardiology Why:  at 4:20PM.  Contact information: [address] [place] [address] [phone]          No Known Allergies  Consultations:  None   Procedures/Studies: Ct Abdomen Pelvis  Wo Contrast  Result Date: 07/30/2017 CLINICAL DATA:  Fever shoulder pain weakness and fatigue history of multiple myeloma EXAM: CT CHEST, ABDOMEN AND PELVIS WITHOUT CONTRAST TECHNIQUE: Multidetector CT imaging of the chest, abdomen and pelvis was performed following the standard protocol without IV contrast. COMPARISON:  CT 04/14/2017, 02/25/2015 FINDINGS: CT CHEST FINDINGS Cardiovascular: Limited evaluation without intravenous contrast. Central venous catheter tip within the distal SVC. Nonaneurysmal aorta. Mild atherosclerosis. Extensive coronary artery calcification. Cardiomegaly. No significant pericardial effusion. Left diaphragmatic hernia containing colon and mesenteric fat. Mediastinum/Nodes: Midline trachea. No thyroid mass. Prominent lymph nodes in the upper mediastinum above the arch measuring up to 14 mm. Small hiatal hernia. Lungs/Pleura: Subsegmental atelectasis at the bases. No focal pulmonary infiltrate or effusion. No pneumothorax. Musculoskeletal: Degenerative changes. No acute or suspicious finding. Bulky calcified loose body at the right shoulder. CT ABDOMEN PELVIS FINDINGS  Hepatobiliary: No focal hepatic abnormality or biliary dilatation. Small calcified stone in the gallbladder. Pancreas: Unremarkable. No pancreatic ductal dilatation or surrounding inflammatory changes. Spleen: Normal in size without focal abnormality. Adrenals/Urinary Tract: Adrenal glands are within normal limits. Considerable perinephric edema and soft tissue stranding. Right kidney appears enlarged. Mild right hydronephrosis and proximal hydroureter, secondary to a 3-4 mm stone in the proximal to mid right ureter at approximate L4-L5 level. Air within the urinary bladder. Stomach/Bowel: Stomach is within normal limits. Appendix appears normal. No evidence of bowel wall thickening, distention, or inflammatory changes. Vascular/Lymphatic: Moderate aortic atherosclerosis. No aneurysmal dilatation. No significantly enlarged lymph nodes. Reproductive: Prostate is unremarkable. Other: Small fat in the right inguinal canal. Negative for free air or free fluid. Subcutaneous edema Musculoskeletal: Degenerative changes of the spine. No acute osseous abnormality. IMPRESSION: 1. Cardiomegaly. No focal pulmonary infiltrate. Left diaphragmatic hernia as before. 2. Considerable bilateral right greater than left perinephric edema. Right kidney is slightly enlarged. There is mild right hydronephrosis and hydroureter, secondary to a 3-4 mm stone in the proximal to mid right ureter. 3. Air within the urinary bladder, question any recent history of instrumentation 4. Small calcified stone in the gallbladder 5. There are a few enlarged mediastinal lymph nodes, nonspecific Electronically Signed   By: Donavan Foil M.D.   On: 07/30/2017 22:00   Ct Chest Wo Contrast  Result Date: 07/30/2017 CLINICAL DATA:  Fever shoulder pain weakness and fatigue history of multiple myeloma EXAM: CT CHEST, ABDOMEN AND PELVIS WITHOUT CONTRAST TECHNIQUE: Multidetector CT imaging of the chest, abdomen and pelvis was performed following the standard  protocol without IV contrast. COMPARISON:  CT 04/14/2017, 02/25/2015 FINDINGS: CT CHEST FINDINGS Cardiovascular: Limited evaluation without intravenous contrast. Central venous catheter tip within the distal SVC. Nonaneurysmal aorta. Mild atherosclerosis. Extensive coronary artery calcification. Cardiomegaly. No significant pericardial effusion. Left diaphragmatic hernia containing colon and mesenteric fat. Mediastinum/Nodes: Midline trachea. No thyroid mass. Prominent lymph nodes in the upper mediastinum above the arch measuring up to 14 mm. Small hiatal hernia. Lungs/Pleura: Subsegmental atelectasis at the bases. No focal pulmonary infiltrate or effusion. No pneumothorax. Musculoskeletal: Degenerative changes. No acute or suspicious finding. Bulky calcified loose body at the right shoulder. CT ABDOMEN PELVIS FINDINGS Hepatobiliary: No focal hepatic abnormality or biliary dilatation. Small calcified stone in the gallbladder. Pancreas: Unremarkable. No pancreatic ductal dilatation or surrounding inflammatory changes. Spleen: Normal in size without focal abnormality. Adrenals/Urinary Tract: Adrenal glands are within normal limits. Considerable perinephric edema and soft tissue stranding. Right kidney appears enlarged. Mild right hydronephrosis and proximal hydroureter, secondary to a 3-4 mm stone in the proximal to mid right ureter  at approximate L4-L5 level. Air within the urinary bladder. Stomach/Bowel: Stomach is within normal limits. Appendix appears normal. No evidence of bowel wall thickening, distention, or inflammatory changes. Vascular/Lymphatic: Moderate aortic atherosclerosis. No aneurysmal dilatation. No significantly enlarged lymph nodes. Reproductive: Prostate is unremarkable. Other: Small fat in the right inguinal canal. Negative for free air or free fluid. Subcutaneous edema Musculoskeletal: Degenerative changes of the spine. No acute osseous abnormality. IMPRESSION: 1. Cardiomegaly. No focal  pulmonary infiltrate. Left diaphragmatic hernia as before. 2. Considerable bilateral right greater than left perinephric edema. Right kidney is slightly enlarged. There is mild right hydronephrosis and hydroureter, secondary to a 3-4 mm stone in the proximal to mid right ureter. 3. Air within the urinary bladder, question any recent history of instrumentation 4. Small calcified stone in the gallbladder 5. There are a few enlarged mediastinal lymph nodes, nonspecific Electronically Signed   By: Donavan Foil M.D.   On: 07/30/2017 22:00   Dg Chest Port 1 View  Result Date: 08/13/2017 CLINICAL DATA:  57 y/o  M; chest pain. EXAM: PORTABLE CHEST 1 VIEW COMPARISON:  08/11/2017 chest radiograph FINDINGS: Stable cardiomegaly and left hemidiaphragm eventration. Right port catheter tip projects over lower SVC. Increased pulmonary vascular congestion and probable interstitial edema. Bones are unremarkable. IMPRESSION: Increased pulmonary vascular congestion and probable interstitial edema. Stable cardiomegaly. Electronically Signed   By: Kristine Garbe M.D.   On: 08/13/2017 13:31   Dg Chest Portable 1 View  Result Date: 08/11/2017 CLINICAL DATA:  Chest pain and shortness of breath. EXAM: PORTABLE CHEST 1 VIEW COMPARISON:  Chest x-rays dated 07/30/2017 and 06/11/2016 and chest CT dated 07/30/2017 FINDINGS: Power port in place, unchanged. Chronic cardiomegaly. Chronic focal eventration of the left hemidiaphragm. Pulmonary vascularity is normal. IMPRESSION: 1. No acute abnormality. 2. Chronic cardiomegaly. 3. Chronic focal eventration of the lateral aspect of the left hemidiaphragm. Electronically Signed   By: Lorriane Shire M.D.   On: 08/11/2017 09:29   Dg Chest Port 1 View  Result Date: 07/30/2017 CLINICAL DATA:  Febrile. Shoulder pain, weakness and fatigue. History of multiple myeloma. EXAM: PORTABLE CHEST 1 VIEW COMPARISON:  Chest radiograph November 24, 2016 and CT abdomen and pelvis February 25, 2015  FINDINGS: Cardiac silhouette is moderately enlarged. Pulmonary vascular congestion. Rounded density projects in RIGHT lung base, with focal air density. No pneumothorax. Single lumen RIGHT chest Port-A-Cath distal tip projects in mid superior vena cava. Faint calcifications RIGHT neck are likely vascular. Loose body RIGHT shoulder. IMPRESSION: LEFT diaphragmatic hernia containing bowel. This could obscure LEFT lung base pneumonia or atelectasis. Moderate cardiomegaly and pulmonary vascular congestion. Electronically Signed   By: Elon Alas M.D.   On: 07/30/2017 18:22   Dg C-arm 1-60 Min-no Report  Result Date: 07/31/2017 Fluoroscopy was utilized by the requesting physician.  No radiographic interpretation.     Discharge Exam: Vitals:   08/14/17 0833 08/14/17 1331  BP: (!) 95/43 (!) 129/51  Pulse: 67 66  Resp: 18 18  Temp:  98.7 F (37.1 C)  SpO2: 98% 100%   Vitals:   08/14/17 0542 08/14/17 0821 08/14/17 0833 08/14/17 1331  BP: 120/84 104/68 (!) 95/43 (!) 129/51  Pulse: 60 65 67 66  Resp: '20 18 18 18  ' Temp: 98.2 F (36.8 C)   98.7 F (37.1 C)  TempSrc: Oral   Oral  SpO2: 100% 99% 98% 100%  Weight: (!) 164.2 kg (361 lb 15.9 oz)     Height:        General: Pt is  alert, awake, not in acute distress Cardiovascular: RRR, S1/S2 +, no rubs, no gallops Respiratory: CTA bilaterally, no wheezing, no rhonchi Abdominal: Soft, NT, ND, bowel sounds + Extremities: no edema, no cyanosis    The results of significant diagnostics from this hospitalization (including imaging, microbiology, ancillary and laboratory) are listed below for reference.     Microbiology: No results found for this or any previous visit (from the past 240 hour(s)).   Labs: BNP (last 3 results) Recent Labs    08/13/17 1310  BNP 628.3*   Basic Metabolic Panel: Recent Labs  Lab 08/11/17 0853 08/13/17 1309 08/14/17 0833  NA 138 135 137  K 4.2 4.4 4.1  CL 104 102 101  CO2 '24 25 25  ' GLUCOSE 203*  216* 159*  BUN 33* 19 17  CREATININE 1.58* 1.56* 1.54*  CALCIUM 8.2* 8.6* 8.3*   Liver Function Tests: Recent Labs  Lab 08/13/17 1309  AST 15  ALT 20  ALKPHOS 82  BILITOT 0.6  PROT 5.7*  ALBUMIN 2.5*   No results for input(s): LIPASE, AMYLASE in the last 168 hours. No results for input(s): AMMONIA in the last 168 hours. CBC: Recent Labs  Lab 08/11/17 0853 08/13/17 1309 08/14/17 0833  WBC 11.0* 9.8 7.5  NEUTROABS  --  7.6  --   HGB 9.1* 8.8* 8.4*  HCT 29.3* 28.0* 26.8*  MCV 95.4 95.2 95.4  PLT 230 224 204   Cardiac Enzymes: Recent Labs  Lab 08/11/17 0853 08/13/17 1309 08/13/17 1717 08/13/17 2044 08/13/17 2343  TROPONINI <0.03 0.03* 0.03* 0.04* 0.03*   BNP: Invalid input(s): POCBNP CBG: Recent Labs  Lab 08/13/17 1915 08/14/17 0726 08/14/17 1125  GLUCAP 185* 153* 173*   D-Dimer No results for input(s): DDIMER in the last 72 hours. Hgb A1c No results for input(s): HGBA1C in the last 72 hours. Lipid Profile No results for input(s): CHOL, HDL, LDLCALC, TRIG, CHOLHDL, LDLDIRECT in the last 72 hours. Thyroid function studies No results for input(s): TSH, T4TOTAL, T3FREE, THYROIDAB in the last 72 hours.  Invalid input(s): FREET3 Anemia work up Recent Labs    08/13/17 2030 08/13/17 2044  VITAMINB12 420  --   FOLATE 25.0  --   FERRITIN 144  --   TIBC 216*  --   IRON 11*  --   RETICCTPCT  --  1.5   Urinalysis    Component Value Date/Time   COLORURINE YELLOW 08/13/2017 1309   APPEARANCEUR CLOUDY (A) 08/13/2017 1309   LABSPEC 1.005 08/13/2017 1309   PHURINE 5.0 08/13/2017 1309   GLUCOSEU NEGATIVE 08/13/2017 1309   HGBUR LARGE (A) 08/13/2017 1309   BILIRUBINUR NEGATIVE 08/13/2017 1309   KETONESUR NEGATIVE 08/13/2017 1309   PROTEINUR 30 (A) 08/13/2017 1309   UROBILINOGEN 0.2 10/03/2012 1809   NITRITE NEGATIVE 08/13/2017 1309   LEUKOCYTESUR LARGE (A) 08/13/2017 1309   Sepsis Labs Invalid input(s): PROCALCITONIN,  WBC,   LACTICIDVEN Microbiology No results found for this or any previous visit (from the past 240 hour(s)).   Time coordinating discharge: Over 30 minutes  SIGNED:   Rodena Goldmann, DO [facility] 08/14/2017, 3:59 PM Pager [phone]  If 7PM-7AM, please contact night-coverage [email or web address] Password [email or web address]

## 2017-08-14 NOTE — Progress Notes (Signed)
Olevia Bowens, MD notified of patient having sinus arrythmia and repeated episodes of Bradycardia in the 30s, rising back up into the 50s. Patient admitted with chest pain, which has continued but not worsened. Will continue to monitor.

## 2017-08-14 NOTE — Progress Notes (Signed)
Patient waiting on EMS to pick him up and take him home. Chest port was flushed with heparin and deaccesed.  Pt has all personal belongings and scripts.

## 2017-08-15 ENCOUNTER — Other Ambulatory Visit (HOSPITAL_COMMUNITY): Payer: BLUE CROSS/BLUE SHIELD

## 2017-08-15 ENCOUNTER — Other Ambulatory Visit (HOSPITAL_COMMUNITY): Payer: Self-pay | Admitting: Adult Health

## 2017-08-15 LAB — URINE CULTURE: Culture: NO GROWTH

## 2017-08-19 ENCOUNTER — Other Ambulatory Visit (HOSPITAL_COMMUNITY): Payer: BLUE CROSS/BLUE SHIELD

## 2017-08-20 ENCOUNTER — Ambulatory Visit (HOSPITAL_COMMUNITY): Payer: BLUE CROSS/BLUE SHIELD | Admitting: Hematology

## 2017-08-24 ENCOUNTER — Other Ambulatory Visit (HOSPITAL_COMMUNITY): Payer: Self-pay | Admitting: Oncology

## 2017-08-24 ENCOUNTER — Other Ambulatory Visit: Payer: Self-pay

## 2017-08-24 DIAGNOSIS — I8312 Varicose veins of left lower extremity with inflammation: Secondary | ICD-10-CM

## 2017-08-27 ENCOUNTER — Ambulatory Visit: Payer: BLUE CROSS/BLUE SHIELD | Admitting: Cardiovascular Disease

## 2017-08-31 ENCOUNTER — Ambulatory Visit (HOSPITAL_COMMUNITY): Payer: BLUE CROSS/BLUE SHIELD | Admitting: Hematology

## 2017-08-31 ENCOUNTER — Ambulatory Visit (HOSPITAL_COMMUNITY): Payer: BLUE CROSS/BLUE SHIELD | Admitting: Internal Medicine

## 2017-09-01 ENCOUNTER — Encounter (HOSPITAL_COMMUNITY): Payer: BLUE CROSS/BLUE SHIELD

## 2017-09-01 ENCOUNTER — Encounter: Payer: BLUE CROSS/BLUE SHIELD | Admitting: Vascular Surgery

## 2017-09-10 ENCOUNTER — Other Ambulatory Visit (HOSPITAL_COMMUNITY): Payer: Self-pay | Admitting: Adult Health

## 2017-09-10 ENCOUNTER — Other Ambulatory Visit (INDEPENDENT_AMBULATORY_CARE_PROVIDER_SITE_OTHER): Payer: Self-pay | Admitting: Internal Medicine

## 2017-09-19 ENCOUNTER — Other Ambulatory Visit: Payer: Self-pay | Admitting: Cardiovascular Disease

## 2017-09-19 ENCOUNTER — Other Ambulatory Visit (HOSPITAL_COMMUNITY): Payer: Self-pay | Admitting: Adult Health

## 2017-09-30 ENCOUNTER — Inpatient Hospital Stay
Admission: AD | Admit: 2017-09-30 | Payer: Self-pay | Source: Other Acute Inpatient Hospital | Admitting: Pulmonary Disease

## 2017-09-30 ENCOUNTER — Emergency Department (HOSPITAL_COMMUNITY): Payer: BLUE CROSS/BLUE SHIELD

## 2017-09-30 ENCOUNTER — Other Ambulatory Visit: Payer: Self-pay

## 2017-09-30 ENCOUNTER — Inpatient Hospital Stay (HOSPITAL_COMMUNITY)
Admission: EM | Admit: 2017-09-30 | Discharge: 2017-10-08 | DRG: 698 | Disposition: A | Discharge status: Home Health | Payer: BLUE CROSS/BLUE SHIELD | Attending: Internal Medicine | Admitting: Internal Medicine

## 2017-09-30 ENCOUNTER — Encounter (HOSPITAL_COMMUNITY): Payer: Self-pay | Admitting: Emergency Medicine

## 2017-09-30 DIAGNOSIS — N183 Chronic kidney disease, stage 3 (moderate): Secondary | ICD-10-CM | POA: Diagnosis present

## 2017-09-30 DIAGNOSIS — T83518A Infection and inflammatory reaction due to other urinary catheter, initial encounter: Principal | ICD-10-CM | POA: Diagnosis present

## 2017-09-30 DIAGNOSIS — E872 Acidosis: Secondary | ICD-10-CM | POA: Diagnosis present

## 2017-09-30 DIAGNOSIS — B9562 Methicillin resistant Staphylococcus aureus infection as the cause of diseases classified elsewhere: Secondary | ICD-10-CM | POA: Diagnosis not present

## 2017-09-30 DIAGNOSIS — D696 Thrombocytopenia, unspecified: Secondary | ICD-10-CM | POA: Diagnosis present

## 2017-09-30 DIAGNOSIS — N179 Acute kidney failure, unspecified: Secondary | ICD-10-CM | POA: Diagnosis present

## 2017-09-30 DIAGNOSIS — Z833 Family history of diabetes mellitus: Secondary | ICD-10-CM

## 2017-09-30 DIAGNOSIS — J9601 Acute respiratory failure with hypoxia: Secondary | ICD-10-CM | POA: Diagnosis present

## 2017-09-30 DIAGNOSIS — E11621 Type 2 diabetes mellitus with foot ulcer: Secondary | ICD-10-CM | POA: Diagnosis present

## 2017-09-30 DIAGNOSIS — C9000 Multiple myeloma not having achieved remission: Secondary | ICD-10-CM | POA: Diagnosis not present

## 2017-09-30 DIAGNOSIS — I4949 Other premature depolarization: Secondary | ICD-10-CM | POA: Diagnosis not present

## 2017-09-30 DIAGNOSIS — Z95828 Presence of other vascular implants and grafts: Secondary | ICD-10-CM | POA: Diagnosis not present

## 2017-09-30 DIAGNOSIS — J9690 Respiratory failure, unspecified, unspecified whether with hypoxia or hypercapnia: Secondary | ICD-10-CM

## 2017-09-30 DIAGNOSIS — Z8249 Family history of ischemic heart disease and other diseases of the circulatory system: Secondary | ICD-10-CM | POA: Diagnosis not present

## 2017-09-30 DIAGNOSIS — I5042 Chronic combined systolic (congestive) and diastolic (congestive) heart failure: Secondary | ICD-10-CM | POA: Diagnosis present

## 2017-09-30 DIAGNOSIS — Z87891 Personal history of nicotine dependence: Secondary | ICD-10-CM

## 2017-09-30 DIAGNOSIS — R4701 Aphasia: Secondary | ICD-10-CM | POA: Diagnosis present

## 2017-09-30 DIAGNOSIS — R6521 Severe sepsis with septic shock: Secondary | ICD-10-CM | POA: Diagnosis present

## 2017-09-30 DIAGNOSIS — R5381 Other malaise: Secondary | ICD-10-CM | POA: Diagnosis present

## 2017-09-30 DIAGNOSIS — E1165 Type 2 diabetes mellitus with hyperglycemia: Secondary | ICD-10-CM | POA: Diagnosis present

## 2017-09-30 DIAGNOSIS — K219 Gastro-esophageal reflux disease without esophagitis: Secondary | ICD-10-CM | POA: Diagnosis present

## 2017-09-30 DIAGNOSIS — T368X5A Adverse effect of other systemic antibiotics, initial encounter: Secondary | ICD-10-CM | POA: Diagnosis present

## 2017-09-30 DIAGNOSIS — G253 Myoclonus: Secondary | ICD-10-CM | POA: Diagnosis present

## 2017-09-30 DIAGNOSIS — Z794 Long term (current) use of insulin: Secondary | ICD-10-CM

## 2017-09-30 DIAGNOSIS — A419 Sepsis, unspecified organism: Secondary | ICD-10-CM | POA: Diagnosis not present

## 2017-09-30 DIAGNOSIS — L89152 Pressure ulcer of sacral region, stage 2: Secondary | ICD-10-CM | POA: Diagnosis present

## 2017-09-30 DIAGNOSIS — B961 Klebsiella pneumoniae [K. pneumoniae] as the cause of diseases classified elsewhere: Secondary | ICD-10-CM | POA: Diagnosis not present

## 2017-09-30 DIAGNOSIS — I2510 Atherosclerotic heart disease of native coronary artery without angina pectoris: Secondary | ICD-10-CM | POA: Diagnosis present

## 2017-09-30 DIAGNOSIS — E785 Hyperlipidemia, unspecified: Secondary | ICD-10-CM | POA: Diagnosis present

## 2017-09-30 DIAGNOSIS — E119 Type 2 diabetes mellitus without complications: Secondary | ICD-10-CM | POA: Diagnosis not present

## 2017-09-30 DIAGNOSIS — Z978 Presence of other specified devices: Secondary | ICD-10-CM

## 2017-09-30 DIAGNOSIS — I252 Old myocardial infarction: Secondary | ICD-10-CM

## 2017-09-30 DIAGNOSIS — N390 Urinary tract infection, site not specified: Secondary | ICD-10-CM | POA: Diagnosis not present

## 2017-09-30 DIAGNOSIS — A4102 Sepsis due to Methicillin resistant Staphylococcus aureus: Secondary | ICD-10-CM | POA: Diagnosis present

## 2017-09-30 DIAGNOSIS — R319 Hematuria, unspecified: Secondary | ICD-10-CM

## 2017-09-30 DIAGNOSIS — R4182 Altered mental status, unspecified: Secondary | ICD-10-CM | POA: Diagnosis not present

## 2017-09-30 DIAGNOSIS — R1915 Other abnormal bowel sounds: Secondary | ICD-10-CM | POA: Diagnosis not present

## 2017-09-30 DIAGNOSIS — Z955 Presence of coronary angioplasty implant and graft: Secondary | ICD-10-CM

## 2017-09-30 DIAGNOSIS — G9341 Metabolic encephalopathy: Secondary | ICD-10-CM | POA: Diagnosis present

## 2017-09-30 DIAGNOSIS — D638 Anemia in other chronic diseases classified elsewhere: Secondary | ICD-10-CM | POA: Diagnosis present

## 2017-09-30 DIAGNOSIS — G7281 Critical illness myopathy: Secondary | ICD-10-CM | POA: Diagnosis not present

## 2017-09-30 DIAGNOSIS — N493 Fournier gangrene: Secondary | ICD-10-CM

## 2017-09-30 DIAGNOSIS — C9001 Multiple myeloma in remission: Secondary | ICD-10-CM | POA: Diagnosis present

## 2017-09-30 DIAGNOSIS — G9340 Encephalopathy, unspecified: Secondary | ICD-10-CM | POA: Diagnosis not present

## 2017-09-30 DIAGNOSIS — I4892 Unspecified atrial flutter: Secondary | ICD-10-CM | POA: Diagnosis present

## 2017-09-30 DIAGNOSIS — B9620 Unspecified Escherichia coli [E. coli] as the cause of diseases classified elsewhere: Secondary | ICD-10-CM | POA: Diagnosis not present

## 2017-09-30 DIAGNOSIS — Z7901 Long term (current) use of anticoagulants: Secondary | ICD-10-CM

## 2017-09-30 DIAGNOSIS — I130 Hypertensive heart and chronic kidney disease with heart failure and stage 1 through stage 4 chronic kidney disease, or unspecified chronic kidney disease: Secondary | ICD-10-CM | POA: Diagnosis present

## 2017-09-30 DIAGNOSIS — R7881 Bacteremia: Secondary | ICD-10-CM | POA: Diagnosis not present

## 2017-09-30 DIAGNOSIS — Z87442 Personal history of urinary calculi: Secondary | ICD-10-CM | POA: Diagnosis not present

## 2017-09-30 DIAGNOSIS — L723 Sebaceous cyst: Secondary | ICD-10-CM | POA: Diagnosis present

## 2017-09-30 DIAGNOSIS — N12 Tubulo-interstitial nephritis, not specified as acute or chronic: Secondary | ICD-10-CM | POA: Diagnosis not present

## 2017-09-30 DIAGNOSIS — E1140 Type 2 diabetes mellitus with diabetic neuropathy, unspecified: Secondary | ICD-10-CM | POA: Diagnosis present

## 2017-09-30 DIAGNOSIS — Z6841 Body Mass Index (BMI) 40.0 and over, adult: Secondary | ICD-10-CM | POA: Diagnosis not present

## 2017-09-30 DIAGNOSIS — G4700 Insomnia, unspecified: Secondary | ICD-10-CM | POA: Diagnosis not present

## 2017-09-30 DIAGNOSIS — R791 Abnormal coagulation profile: Secondary | ICD-10-CM | POA: Diagnosis present

## 2017-09-30 DIAGNOSIS — Z79899 Other long term (current) drug therapy: Secondary | ICD-10-CM | POA: Diagnosis not present

## 2017-09-30 DIAGNOSIS — Z7982 Long term (current) use of aspirin: Secondary | ICD-10-CM

## 2017-09-30 DIAGNOSIS — Y846 Urinary catheterization as the cause of abnormal reaction of the patient, or of later complication, without mention of misadventure at the time of the procedure: Secondary | ICD-10-CM | POA: Diagnosis present

## 2017-09-30 DIAGNOSIS — E870 Hyperosmolality and hypernatremia: Secondary | ICD-10-CM | POA: Diagnosis not present

## 2017-09-30 DIAGNOSIS — Z7952 Long term (current) use of systemic steroids: Secondary | ICD-10-CM

## 2017-09-30 DIAGNOSIS — E1122 Type 2 diabetes mellitus with diabetic chronic kidney disease: Secondary | ICD-10-CM | POA: Diagnosis present

## 2017-09-30 DIAGNOSIS — G4733 Obstructive sleep apnea (adult) (pediatric): Secondary | ICD-10-CM | POA: Diagnosis present

## 2017-09-30 DIAGNOSIS — Z9911 Dependence on respirator [ventilator] status: Secondary | ICD-10-CM | POA: Diagnosis not present

## 2017-09-30 DIAGNOSIS — Z960 Presence of urogenital implants: Secondary | ICD-10-CM | POA: Diagnosis not present

## 2017-09-30 HISTORY — DX: Cutaneous abscess, unspecified: L02.91

## 2017-09-30 LAB — CBC WITH DIFFERENTIAL/PLATELET
Basophils Absolute: 0 10*3/uL (ref 0.0–0.1)
Basophils Relative: 0 %
Eosinophils Absolute: 0 10*3/uL (ref 0.0–0.7)
Eosinophils Relative: 0 %
HCT: 32.9 % — ABNORMAL LOW (ref 39.0–52.0)
Hemoglobin: 10.6 g/dL — ABNORMAL LOW (ref 13.0–17.0)
Lymphocytes Relative: 4 %
Lymphs Abs: 0.9 10*3/uL (ref 0.7–4.0)
MCH: 30.2 pg (ref 26.0–34.0)
MCHC: 32.2 g/dL (ref 30.0–36.0)
MCV: 93.7 fL (ref 78.0–100.0)
Monocytes Absolute: 1.3 10*3/uL — ABNORMAL HIGH (ref 0.1–1.0)
Monocytes Relative: 6 %
Neutro Abs: 19.2 10*3/uL (ref 1.7–7.7)
Neutrophils Relative %: 90 %
Platelets: 104 10*3/uL — ABNORMAL LOW (ref 150–400)
RBC: 3.51 MIL/uL — ABNORMAL LOW (ref 4.22–5.81)
RDW: 17.5 % — ABNORMAL HIGH (ref 11.5–15.5)
WBC: 21.4 10*3/uL — ABNORMAL HIGH (ref 4.0–10.5)

## 2017-09-30 LAB — COMPREHENSIVE METABOLIC PANEL
ALT: 63 U/L (ref 17–63)
AST: 94 U/L — ABNORMAL HIGH (ref 15–41)
Albumin: 2.7 g/dL — ABNORMAL LOW (ref 3.5–5.0)
Alkaline Phosphatase: 64 U/L (ref 38–126)
Anion gap: 14 (ref 5–15)
BUN: 54 mg/dL — ABNORMAL HIGH (ref 6–20)
CO2: 18 mmol/L — ABNORMAL LOW (ref 22–32)
Calcium: 8.3 mg/dL — ABNORMAL LOW (ref 8.9–10.3)
Chloride: 100 mmol/L — ABNORMAL LOW (ref 101–111)
Creatinine, Ser: 3.48 mg/dL — ABNORMAL HIGH (ref 0.61–1.24)
GFR calc Af Amer: 21 mL/min — ABNORMAL LOW (ref >60)
GFR calc non Af Amer: 18 mL/min — ABNORMAL LOW (ref >60)
Glucose, Bld: 100 mg/dL — ABNORMAL HIGH (ref 65–99)
Potassium: 4.6 mmol/L (ref 3.5–5.1)
Sodium: 132 mmol/L — ABNORMAL LOW (ref 135–145)
Total Bilirubin: 0.9 mg/dL (ref 0.3–1.2)
Total Protein: 6.1 g/dL — ABNORMAL LOW (ref 6.5–8.1)

## 2017-09-30 LAB — URINALYSIS, ROUTINE W REFLEX MICROSCOPIC
Bilirubin Urine: NEGATIVE
Glucose, UA: NEGATIVE mg/dL
Ketones, ur: NEGATIVE mg/dL
Nitrite: NEGATIVE
Protein, ur: 100 mg/dL — AB
Specific Gravity, Urine: 1.017 (ref 1.005–1.030)
pH: 7 (ref 5.0–8.0)

## 2017-09-30 LAB — PROTIME-INR
INR: 2.86
Prothrombin Time: 29.8 seconds — ABNORMAL HIGH (ref 11.4–15.2)

## 2017-09-30 LAB — I-STAT CG4 LACTIC ACID, ED
Lactic Acid, Venous: 3.77 mmol/L (ref 0.5–1.9)
Lactic Acid, Venous: 3.41 mmol/L (ref 0.5–1.9)

## 2017-09-30 MED ORDER — SODIUM CHLORIDE 0.9 % IV SOLN: INTRAVENOUS | Status: DC

## 2017-09-30 MED ORDER — NOREPINEPHRINE BITARTRATE 1 MG/ML IV SOLN
0.0000 ug/min | INTRAVENOUS | Status: DC
  Administered 2017-09-30: 4 ug/min via INTRAVENOUS
  Administered 2017-10-01: 20 ug/min via INTRAVENOUS
  Filled 2017-09-30 (×3): qty 4

## 2017-09-30 MED ORDER — PIPERACILLIN-TAZOBACTAM 3.375 G IVPB 30 MIN
3.3750 g | Freq: Once | INTRAVENOUS | Status: AC
  Administered 2017-09-30: 3.375 g via INTRAVENOUS

## 2017-09-30 MED ORDER — SODIUM CHLORIDE 0.9 % IV SOLN
INTRAVENOUS | Status: DC
  Administered 2017-09-30: 20:00:00 via INTRAVENOUS

## 2017-09-30 MED ORDER — SODIUM CHLORIDE 0.9 % IV BOLUS
3500.0000 mL | Freq: Once | INTRAVENOUS | Status: AC
  Administered 2017-09-30: 3500 mL via INTRAVENOUS

## 2017-09-30 MED ORDER — FENTANYL CITRATE (PF) 100 MCG/2ML IJ SOLN
50.0000 ug | Freq: Once | INTRAMUSCULAR | Status: AC
Start: 2017-09-30 — End: 2017-09-30
  Administered 2017-09-30: 50 ug via INTRAVENOUS
  Filled 2017-09-30: qty 2

## 2017-09-30 MED ORDER — SODIUM CHLORIDE 0.9 % IV BOLUS
1000.0000 mL | Freq: Once | INTRAVENOUS | Status: AC
  Administered 2017-09-30: 1000 mL via INTRAVENOUS

## 2017-09-30 MED ORDER — ACETAMINOPHEN 650 MG RE SUPP
RECTAL | Status: AC
  Administered 2017-09-30: 18:00:00
  Filled 2017-09-30: qty 1

## 2017-09-30 MED ORDER — VANCOMYCIN HCL IN DEXTROSE 1-5 GM/200ML-% IV SOLN
INTRAVENOUS | Status: AC
  Filled 2017-09-30: qty 200

## 2017-09-30 MED ORDER — PIPERACILLIN-TAZOBACTAM 3.375 G IVPB
INTRAVENOUS | Status: AC
  Filled 2017-09-30: qty 50

## 2017-09-30 MED ORDER — FENTANYL CITRATE (PF) 100 MCG/2ML IJ SOLN
50.0000 ug | Freq: Once | INTRAMUSCULAR | Status: AC
  Administered 2017-09-30 – 2017-10-01 (×2): 50 ug via INTRAVENOUS
  Filled 2017-09-30: qty 2

## 2017-09-30 MED ORDER — NOREPINEPHRINE 4 MG/250ML-% IV SOLN
INTRAVENOUS | Status: AC
  Filled 2017-09-30: qty 250

## 2017-09-30 MED ORDER — VANCOMYCIN HCL IN DEXTROSE 1-5 GM/200ML-% IV SOLN
1000.0000 mg | Freq: Once | INTRAVENOUS | Status: AC
  Administered 2017-09-30: 1000 mg via INTRAVENOUS

## 2017-09-30 NOTE — ED Provider Notes (Signed)
[facility] EMERGENCY DEPARTMENT Provider Note   CSN: [id number] Arrival date & time: 09/30/17  1724     History   Chief Complaint Chief Complaint  Patient presents with  . Altered Mental Status    HPI [name] is a 57 y.o. male.  HPI   57yM with fever and altered mental status.  History is primarily from wife and review of records as patient is encephalopathic.  She reports that she noticed a change very early this morning.  Progressively less responsive.  At baseline he is lucid and can carry on a conversation.  He is chronically debilitated though and walks with a walker at baseline.  Given recent illness/hospitalization he has been even more conditioned and more sedentary. He had not been complaining of anything acutely different in the preceding couple days.  He has extensive past medical history.  More recently, he was admitted approximately 2 months ago for Klebsiella bacteremia in the setting of an obstructing ureteral stone.  He had a ureteral stent placed. Wife thinks it is still in place. Has chronic foley which she says was replaced yesterday by home health nurse.   Past Medical History:  Diagnosis Date  . Anemia   . Arteriosclerotic cardiovascular disease (ASCVD)    MI-2000s; stent to the proximal LAD and diagonal in 2001; stress nuclear in 2008-impaired exercise capacity, left ventricular dilatation, moderately to severely depressed EF, apical, inferior and anteroseptal scar  . Arthritis   . Atrial flutter ([place])   . 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 Cellulitis of leg    both legs  . Chronic diarrhea   . Chronic kidney disease, stage 3, mod decreased GFR (HCC)    Creatinine of 1.84 in 06/2011 and 1.5 in 07/2011  . Diabetes mellitus    Insulin  . Dysrhythmia    AFlutter  . GERD (gastroesophageal reflux disease)   . Gout   . Hyperlipidemia   . Hypertension   . Injection site reaction   . Multiple myeloma 07/01/2011  . Myocardial infarction ([place]) 2000    . Obesity   . Pedal edema    Venous insufficiency  . Sleep apnea    uses cpap  . Ulcer     Patient Active Problem List   Diagnosis Date Noted  . Generalized weakness 08/13/2017  . Chest pain 08/13/2017  . Hematochezia 08/13/2017  . CKD (chronic kidney disease) 08/13/2017  . Normocytic anemia 08/13/2017  . Chronic anemia   . Pressure injury of skin 08/04/2017  . Sepsis secondary to UTI ([place]) 07/31/2017  . AKI (acute kidney injury) ([place]) 07/31/2017  . Sepsis ([place]) 07/31/2017  . Fournier gangrene 04/14/2017  . Epididymitis 04/13/2017  . Acute epididymitis 04/13/2017  . Pneumonia 06/10/2016  . CAP (community acquired pneumonia) 06/10/2016  . Cellulitis of left lower extremity   . Hypogammaglobulinemia ([place]) 03/09/2016  . Hypokalemia 03/09/2016  . Diaphragmatic hernia without obstruction 02/26/2016  . Chronic systolic heart failure ([place]) 12/19/2013  . Anemia, normocytic normochromic 10/09/2012  . Chronic pancreatitis ([place]) 10/07/2012  . Atrial flutter ([place]) 09/17/2012  . DDD (degenerative disc disease), cervical 03/18/2012  . Arteriosclerotic cardiovascular disease (ASCVD)   . Hypertension   . Hyperlipidemia   . Multiple myeloma ([place]) 07/01/2011  . Morbid obesity ([place]) 04/29/2010  . Insulin dependent diabetes mellitus ([place]) 11/15/2008  . Sleep apnea 11/15/2008    Past Surgical History:  Procedure Laterality Date  . ABSCESS DRAINAGE     Scrotal  . BIOPSY  01/02/2012   Procedure:  BIOPSY;  Surgeon: Rogene Houston, MD;  Location: [facility] ENDO SUITE;  Service: Endoscopy;  Laterality: N/A;  . BONE MARROW BIOPSY  05/13/11  . CARDIAC CATHETERIZATION     cardiac stent  . CARDIOVERSION N/A 10/13/2012   Procedure: CARDIOVERSION;  Surgeon: Yehuda Savannah, MD;  Location: [facility] ORS;  Service: Cardiovascular;  Laterality: N/A;  . CATARACT EXTRACTION W/PHACO Left 02/13/2014   Procedure: CATARACT EXTRACTION PHACO AND INTRAOCULAR LENS PLACEMENT ([place]);  Surgeon: Tonny Branch, MD;  Location: [facility]  ORS;  Service: Ophthalmology;  Laterality: Left;  CDE:  7.67  . CATARACT EXTRACTION W/PHACO Right 03/02/2014   Procedure: CATARACT EXTRACTION PHACO AND INTRAOCULAR LENS PLACEMENT RIGHT EYE CDE=16.81;  Surgeon: Tonny Branch, MD;  Location: [facility] ORS;  Service: Ophthalmology;  Laterality: Right;  . COLONOSCOPY  11/28/2011   Procedure: COLONOSCOPY;  Surgeon: Rogene Houston, MD;  Location: [facility] ENDO SUITE;  Service: Endoscopy;  Laterality: N/A;  930  . CYSTOSCOPY W/ URETERAL STENT PLACEMENT Right 07/31/2017   Procedure: CYSTOSCOPY WITH RETROGRADE PYELOGRAM/URETERAL STENT PLACEMENT;  Surgeon: Cleon Gustin, MD;  Location: [facility] ORS;  Service: Urology;  Laterality: Right;  . ESOPHAGOGASTRODUODENOSCOPY  01/02/2012   Procedure: ESOPHAGOGASTRODUODENOSCOPY (EGD);  Surgeon: Rogene Houston, MD;  Location: [facility] END[facility] SU[facility];  Service: Endoscopy;  Laterality: N/A;  100  . ESOPHAGOGASTRODUODENOSCOPY N/A 09/20/2012   Procedure: ESOPHAGOGASTRODUODENOSCOPY (EGD);  Surgeon: Rogene Houston, MD;  Location: [facility] END[facility] SU[facility];  Service: Endoscopy;  Laterality: N/A;  . EUS N/A 10/07/2012   Procedure: UPPER ENDOSCOPIC ULTRASOUND (EUS) LINEAR;  Surgeon: Milus Banister, MD;  Location: [facility] ENDOSCOPY;  Service: Endoscopy;  Laterality: N/A;  . INCISION AND DRAINAGE ABSCESS N/A 04/14/2017   Procedure: INCISION AND DRAINAGE ABSCESS;  Surgeon: Ceasar Mons, MD;  Location: [facility] ORS;  Service: Urology;  Laterality: N/A;  . INCISION AND DRAINAGE ABSCESS ANAL    . IRRIGATION AND DEBRIDEMENT ABSCESS N/A 04/15/2017   Procedure: DEBRIDEMENT SCROTAL WOUND AND DRESSING CHANGE;  Surgeon: Ceasar Mons, MD;  Location: [facility] ORS;  Service: Urology;  Laterality: N/A;  . IRRIGATION AND DEBRIDEMENT ABSCESS N/A 04/17/2017   Procedure: IRRIGATION AND DEBRIDEMENT ABSCESS;  Surgeon: Ceasar Mons, MD;  Location: [facility] OR[facility];  Service: Urology;  Laterality: N/A;  RM 3  . LAPAROSCOPIC GASTRIC BANDING  2006   has been removed  . OSTECTOMY  Right 04/08/2017   Procedure: OSTECTOMY RIGHT GREAT TOE;  Surgeon: Caprice Beaver, DPM;  Location: [facility] ORS;  Service: Podiatry;  Laterality: Right;  . PORT-A-CATH REMOVAL Left 12/07/2012   Procedure: REMOVAL PORT-A-CATH;  Surgeon: Scherry Ran, MD;  Location: [facility] ORS;  Service: General;  Laterality: Left;  . PORTACATH PLACEMENT  07/07/2011   Procedure: INSERTION PORT-A-CATH;  Surgeon: Scherry Ran, MD;  Location: [facility] ORS;  Service: General;  Laterality: N/A;  . PORTACATH PLACEMENT N/A 12/07/2012   Procedure: INSERTION PORT-A-CATH;  Surgeon: Scherry Ran, MD;  Location: [facility] ORS;  Service: General;  Laterality: N/A;  Attempted portacath placement on left and right side  . WOUND DEBRIDEMENT Right 04/08/2017   Procedure: EXCISION ULCERATION RIGHT GREAT TOE;  Surgeon: Caprice Beaver, DPM;  Location: [facility] ORS;  Service: Podiatry;  Laterality: Right;  . WRIST SURGERY     Left; removal of bone fragment        Home Medications    Prior to Admission medications   Medication Sig Start Date End Date Taking? Authorizing Provider  acyclovir (ZOVIRAX) 400 MG tablet Take 1 tablet (400 mg total) by mouth  every morning. 02/18/17   Twana First, MD  allopurinol (ZYLOPRIM) 300 MG tablet TAKE ONE TABLET BY MOUTH ONCE DAILY. 05/11/17   Holley Bouche, NP  allopurinol (ZYLOPRIM) 300 MG tablet TAKE ONE TABLET BY MOUTH ONCE DAILY. 09/15/17   Holley Bouche, NP  Artificial Tear Ointment (DRY EYES OP) Place 1 drop into both eyes as needed (dry eyes).    [provider]  aspirin EC 81 MG tablet Take 81 mg by mouth daily.    [provider]  atorvastatin (LIPITOR) 80 MG tablet TAKE ONE TABLET BY MOUTH AT BEDTIME. 12/18/16   Herminio Commons, MD  calcitRIOL (ROCALTROL) 0.25 MCG capsule Take 0.25 mcg by mouth 3 (three) times a week. Monday, Wednesday, Friday. 12/14/13   [provider]  Calcium Carbonate (CALCIUM 600 PO) Take 1 tablet by mouth 2 (two) times daily.     [provider]  Calcium Carbonate-Vit D-Min (CALCIUM 1200 PO) Take 1 tablet by mouth 2 (two) times daily.    [provider]  dexlansoprazole (DEXILANT) 60 MG capsule Take 60 mg by mouth every morning.    [provider]  dicyclomine (BENTYL) 20 MG tablet TAKE 1 TABLET BY MOUTH BEFORE MEALS 3 TIMES DAILY. 05/18/17   Rehman, Mechele Dawley, MD  dicyclomine (BENTYL) 20 MG tablet TAKE 1 TABLET BY MOUTH BEFORE MEALS 3 TIMES DAILY. 09/11/17   Rehman, Mechele Dawley, MD  Eluxadoline (VIBERZI) 100 MG TABS Take 100 mg 2 (two) times daily with a meal by mouth.     [provider]  gabapentin (NEURONTIN) 300 MG capsule Take 1 capsule (300 mg total) by mouth 2 (two) times daily. 04/22/17   Janece Canterbury, MD  insulin detemir (LEVEMIR) 100 UNIT/ML injection Inject 0.1 mLs (10 Units total) into the skin every morning. 04/22/17   Janece Canterbury, MD  insulin lispro (HUMALOG) 100 UNIT/ML injection Inject 0-0.1 mLs (0-10 Units total) into the skin 2 (two) times daily with a meal. Sliding Scale per patient  CBG 150-200: 1 units CBG 201-275:  3 units CBG 275-350: 5 units 351-425: 7 units 425-500: 9 units 500+: 10 units 04/22/17   Short, Noah Delaine, MD  loratadine (CLARITIN) 10 MG tablet Take 10 mg by mouth every morning.     [provider]  Magnesium Cl-Calcium Carbonate (SLOW-MAG PO) Take 1 tablet by mouth every morning.    [provider]  methocarbamol (ROBAXIN) 500 MG tablet Take 1 tablet (500 mg total) by mouth every 8 (eight) hours as needed for muscle spasms. 08/04/17   Lavina Hamman, MD  Multiple Vitamins-Minerals (MULTIVITAMINS THER. W/MINERALS) TABS Take 1 tablet by mouth daily.     [provider]  nitroGLYCERIN (NITROSTAT) 0.4 MG SL tablet DISSOLVE 1 TABLET UNDER TONGUE EVERY 5 MINUTES UP TO 15 MIN FOR CHESTPAIN. IF NO RELIEF CALL 911. 05/11/17   Holley Bouche, NP  potassium chloride SA (K-DUR,KLOR-CON) 20 MEQ tablet Take 1 tablet (20 mEq  total) by mouth daily. 08/05/17   Lavina Hamman, MD  sulfamethoxazole-trimethoprim (BACTRIM DS,SEPTRA DS) 800-160 MG tablet TAKE ONE TABLET BY MOUTH EVERY MONDAY, WEDNESDAY, AND FRIDAY. 06/12/17   Holley Bouche, NP  sulfamethoxazole-trimethoprim (BACTRIM DS,SEPTRA DS) 800-160 MG tablet TAKE ONE TABLET BY MOUTH EVERY MONDAY, WEDNESDAY, AND FRIDAY. 08/16/17   Holley Bouche, NP  sulfamethoxazole-trimethoprim (BACTRIM DS,SEPTRA DS) 800-160 MG tablet TAKE ONE TABLET BY MOUTH EVERY MONDAY, WEDNESDAY, AND FRIDAY. 09/20/17   Holley Bouche, NP  torsemide (DEMADEX) 20 MG tablet  Take 1 tablet (20 mg total) by mouth daily. Take extra 1 tab for weight gain of 3 lbs in 1 day or 5 lbs in 2 days 08/05/17   Lavina Hamman, MD  traMADol (ULTRAM) 50 MG tablet Take 1 tablet (50 mg total) by mouth every 6 (six) hours as needed. Patient taking differently: Take 50 mg by mouth every 6 (six) hours as needed for moderate pain.  08/11/17   Milton Ferguson, MD  Vitamin D, Ergocalciferol, (DRISDOL) 50000 units CAPS capsule TAKE 1 CAPSULE BY MOUTH EVERY THIRTY DAYS. 05/11/17   Holley Bouche, NP  XARELTO 20 MG TABS tablet TAKE 1 TABLET BY MOUTH DAILY. 09/21/17   Herminio Commons, MD    Family History Family History  Problem Relation Age of Onset  . Heart disease Mother   . Cancer Mother   . Diabetes Father   . Arthritis Unknown   . Anesthesia problems Neg Hx   . Hypotension Neg Hx   . Malignant hyperthermia Neg Hx   . Pseudochol deficiency Neg Hx     Social History Social History   Tobacco Use  . Smoking status: Former Smoker    Packs/day: 0.25    Years: 1.00    Pack years: 0.25    Types: Cigarettes, Cigars    Last attempt to quit: 05/17/2001    Years since quitting: 16.3  . Smokeless tobacco: Never Used  Substance Use Topics  . Alcohol use: No    Alcohol/week: 0.0 oz  . Drug use: No     Allergies   Patient has no known allergies.   Review of Systems Review of Systems  Level 5  caveat because pt is encephalopathic.   Physical Exam Updated Vital Signs BP (!) 70/35   Pulse 79   Temp (!) 102.2 F (39 C) (Rectal)   Resp (!) 32   Wt (!) 149.7 kg (330 lb)   SpO2 98%   BMI 42.37 kg/m   Physical Exam  Constitutional: He appears distressed.  Morbidly obese. Appears uncomfortable. Grunting at times.   HENT:  Head: Normocephalic and atraumatic.  Eyes: Pupils are equal, round, and reactive to light. Conjunctivae are normal. Right eye exhibits no discharge. Left eye exhibits no discharge.  Neck: Neck supple.  Cardiovascular: Normal rate, regular rhythm and normal heart sounds. Exam reveals no gallop and no friction rub.  No murmur heard. Pulmonary/Chest: Breath sounds normal. No respiratory distress.  Tachypnea. Breath sounds diminished b/l. No adventious breath sounds appreciated.   Abdominal: Soft. He exhibits no distension. There is no tenderness.  Genitourinary:  Genitourinary Comments: Foley catheter with some blood noted on it. No blood noted at meatus though. No significant skin break down of scrotum/perineum.   Musculoskeletal: He exhibits no edema or tenderness.  Neurological:  Drowsy. Confused. Will respond to questioning but only saying "yeah" or "ok" regardless of what is said to him. Moving all extremities but not following commands.   Skin: Skin is warm and dry.  Quarter sized pedunculated mass mid lower back, likely large skin tag. Some superficial ulceration to it, but doesn't appear infected.   Nursing note and vitals reviewed.    ED Treatments / Results  Labs (all labs ordered are listed, but only abnormal results are displayed) Labs Reviewed  COMPREHENSIVE METABOLIC PANEL - Abnormal; Notable for the following components:      Result Value   Sodium 132 (*)    Chloride 100 (*)    CO2 18 (*)  Glucose, Bld 100 (*)    BUN 54 (*)    Creatinine, Ser 3.48 (*)    Calcium 8.3 (*)    Total Protein 6.1 (*)    Albumin 2.7 (*)    AST 94 (*)     GFR calc non Af Amer 18 (*)    GFR calc Af Amer 21 (*)    All other components within normal limits  CBC WITH DIFFERENTIAL/PLATELET - Abnormal; Notable for the following components:   WBC 21.4 (*)    RBC 3.51 (*)    Hemoglobin 10.6 (*)    HCT 32.9 (*)    RDW 17.5 (*)    Platelets 104 (*)    Monocytes Absolute 1.3 (*)    All other components within normal limits  PROTIME-INR - Abnormal; Notable for the following components:   Prothrombin Time 29.8 (*)    All other components within normal limits  URINALYSIS, ROUTINE W REFLEX MICROSCOPIC - Abnormal; Notable for the following components:   APPearance TURBID (*)    Hgb urine dipstick LARGE (*)    Protein, ur 100 (*)    Leukocytes, UA MODERATE (*)    Bacteria, UA RARE (*)    All other components within normal limits  I-STAT CG4 LACTIC ACID, ED - Abnormal; Notable for the following components:   Lactic Acid, Venous 3.77 (*)    All other components within normal limits  I-STAT CG4 LACTIC ACID, ED - Abnormal; Notable for the following components:   Lactic Acid, Venous 3.41 (*)    All other components within normal limits  CULTURE, BLOOD (ROUTINE X 2)  CULTURE, BLOOD (ROUTINE X 2)  URINE CULTURE    EKG EKG Interpretation  Date/Time:  Wednesday Sep 30 2017 17:31:03 EDT Ventricular Rate:  80 PR Interval:    QRS Duration: 90 QT Interval:  495 QTC Calculation: 572 R Axis:   -37 Text Interpretation:  likley sinus rhythm low voltage Inferior infarct, old Abnormal lateral Q waves Anterior infarct, old Confirmed by Virgel Manifold [phone]) on 09/30/2017 6:48:11 PM   Radiology Ct Abdomen Pelvis Wo Contrast  Result Date: 09/30/2017 CLINICAL DATA:  57 year old male presents with hematuria, sepsis and fever. Hypotension. History of obstructing ureteral stones. EXAM: CT ABDOMEN AND PELVIS WITHOUT CONTRAST TECHNIQUE: Multidetector CT imaging of the abdomen and pelvis was performed following the standard protocol without IV contrast. COMPARISON:   07/30/2017 FINDINGS: Lower chest: New bibasilar pulmonary consolidations likely reflecting atelectasis, left greater than right. Superimposed pneumonia would be difficult to entirely exclude given history of fever and sepsis. Heart size is top normal in size without pericardial effusion. Hepatobiliary: 6 mm dependent gallstone within a non thickened but distended gallbladder. This could be due to a fasting state. The unenhanced liver is unremarkable. Pancreas: No ductal dilatation or mass given limitations of a noncontrast study. Spleen: Normal size spleen. Adrenals/Urinary Tract: Normal bilateral adrenal glands. Right-sided double pigtail ureteral stent is identified with the proximal coil in the right renal pelvis and distal coil in the decompressed urinary bladder. No calculus identified along the course of the ureteral stent. A Foley catheter decompresses the urinary bladder. Left kidney is unremarkable apart from a punctate nonobstructing left renal calculus. Moderate perinephric fat stranding is noted. Stomach/Bowel: Small hiatal hernia. Decompressed stomach with normal small bowel rotation. Normal appendix. Minimal scattered colonic diverticulosis without acute diverticulitis along the left colon. Vascular/Lymphatic: No aortic aneurysm. Aortoiliac and branch vessel atherosclerosis. No aneurysm or adenopathy. Reproductive: Normal size prostate and seminal vesicles. Other: No  free air. Small fat containing umbilical hernia. Small fat containing right inguinal hernia. Musculoskeletal: Thoracolumbar spondylosis with multilevel degenerative disc disease most marked from L2 through S1 in the lumbar spine. Associated facet arthropathy is seen. IMPRESSION: 1. Uncomplicated cholelithiasis. 2. Nonobstructing left lower pole renal calculus. No hydroureteronephrosis. Stable right double pigtail ureteral stent in place with decompressed bladder containing a Foley catheter. 3. Patchy airspace opacities at each lung base,  left greater than right compatible with atelectasis. Minimal superimposed pneumonia is not excluded given history of fever and sepsis. 4. Thoracolumbar spondylosis. Electronically Signed   By: Ashley Royalty M.D.   On: 09/30/2017 20:40   Dg Chest Port 1 View  Result Date: 09/30/2017 CLINICAL DATA:  Altered mental status. EXAM: PORTABLE CHEST 1 VIEW COMPARISON:  08/13/2017 FINDINGS: Right-sided power port remains in appropriate position. Low lung volumes are again seen. Rounded opacity in the left lung base is again seen which is consistent with a a left diaphragmatic hernia is as shown on previous CT. Cardiomegaly and pulmonary vascular congestion are stable. No evidence of acute infiltrate or pleural effusion. IMPRESSION: No acute findings. Stable cardiomegaly and pulmonary vascular congestion. Stable left diaphragmatic hernia. Electronically Signed   By: Earle Gell M.D.   On: 09/30/2017 18:41    Procedures Procedures (including critical care time)  CRITICAL CARE Performed by: Virgel Manifold Total critical care time: 80 minutes Critical care time was exclusive of separately billable procedures and treating other patients. Critical care was necessary to treat or prevent imminent or life-threatening deterioration. Critical care was time spent personally by me on the following activities: development of treatment plan with patient and/or surrogate as well as nursing, discussions with consultants, evaluation of patient's response to treatment, examination of patient, obtaining history from patient or surrogate, ordering and performing treatments and interventions, ordering and review of laboratory studies, ordering and review of radiographic studies, pulse oximetry and re-evaluation of patient's condition.  CENTRAL LINE Performed by: Virgel Manifold Consent: The procedure was performed in an emergent situation. Required items: required blood products, implants, devices, and special equipment  available Patient identity confirmed: arm band and provided demographic data Time out: Immediately prior to procedure a "time out" was called to verify the correct patient, procedure, equipment, support staff and site/side marked as required. Indications: vascular access Anesthesia: local infiltration Local anesthetic: lidocaine 1% with epinephrine Anesthetic total: 3 ml Patient sedated: no Preparation: skin prepped with 2% chlorhexidine Skin prep agent dried: skin prep agent completely dried prior to procedure Sterile barriers: all five maximum sterile barriers used - cap, mask, sterile gown, sterile gloves, and large sterile sheet Hand hygiene: hand hygiene performed prior to central venous catheter insertion  Location details: R femoral vein  Catheter type: triple lumen Catheter size: 8 Fr Pre-procedure: landmarks identified Ultrasound guidance: yes Successful placement: yes Post-procedure: line sutured and dressing applied Assessment: blood return through all parts, free fluid flow. Patient tolerance: Patient tolerated the procedure well with no immediate complications.   Medications Ordered in ED Medications  norepinephrine (LEVOPHED) 4 mg in dextrose 5 % 250 mL (0.016 mg/mL) infusion (12 mcg/min Intravenous Rate/Dose Change 09/30/17 2153)  0.9 %  sodium chloride infusion ( Intravenous New Bag/Given 09/30/17 1945)  acetaminophen (TYLENOL) 650 MG suppository (  Given 09/30/17 1755)  sodium chloride 0.9 % bolus 3,500 mL (0 mLs Intravenous Stopped 09/30/17 1951)  vancomycin (VANCOCIN) IVPB 1000 mg/200 mL premix (0 mg Intravenous Stopped 09/30/17 1928)  piperacillin-tazobactam (ZOSYN) IVPB 3.375 g (0 g Intravenous Stopped 09/30/17  1847)  sodium chloride 0.9 % bolus 1,000 mL (1,000 mLs Intravenous New Bag/Given 09/30/17 2059)  fentaNYL (SUBLIMAZE) injection 50 mcg (50 mcg Intravenous Given 09/30/17 2148)     Initial Impression / Assessment and Plan / ED Course  I have reviewed the triage vital  signs and the nursing notes.  Pertinent labs & imaging results that were available during my care of the patient were reviewed by me and considered in my medical decision making (see chart for details).  Clinical Course as of Sep 30 2152  Wed Sep 30, 2017  1915 Still hypotensive after ~3L. Central line placed. Levophed.    [SK]  2130 Discussed with Dr Alva Garnet, [place]. Would accept pt, but [facility]  Multiple family members at bedside updated.    [SK]    Clinical Course User Index [SK] Virgel Manifold, MD    57 year old male with sepsis.  UA is consistent with UTI. Foley was just changed yesterday per wife. He is morbidly obese.  He received initial IV fluid bolus based on adjusted body weight.  Remained hypotensive.  Central line was placed.  Levophed.  He had already received empiric antibiotics with Zosyn and vancomycin.    On review of records, he has a recent history of right ureteral stone status post stenting 2 months ago in the setting of sepsis.  Plan at that time was to have stone extraction in 2 weeks but it is unclear for me from review of records if this was performed or not.  Will CT his abdomen and pelvis with this history particularly since he is too encephalopathic to give a coherent history/reliable review of systems.  Will need admitted to the hospital.  Will discuss with critical care.  CT w/o acute findings which I feel are contributing to current clinical picture.   Final Clinical Impressions(s) / ED Diagnoses   Final diagnoses:  Septic shock ([place])  Urinary tract infection with hematuria, site unspecified  AKI (acute kidney injury) [facility])    ED Discharge Orders    None       Virgel Manifold, MD 10/02/17 1942

## 2017-09-30 NOTE — ED Notes (Signed)
Pt will not tell me his name or where he is. He responds to pain and when you ask him if he is in pain he says yes, but will not tell me where.

## 2017-09-30 NOTE — ED Notes (Signed)
CRITICAL VALUE ALERT  Critical Value:  Lactic acid 3.77  Date & Time Notied:  09/30/2017 @ 1800  Provider Notified: Wilson Singer  Orders Received/Actions taken: orders given

## 2017-09-30 NOTE — ED Triage Notes (Signed)
Pt responsive to pain, eyes open but when trying to talk unable to respond verbally. When asked if he is in pain shakes head yes.

## 2017-09-30 NOTE — ED Notes (Signed)
Foley catheter was changed yesterday by home health RN per wife. Blood noted in bag.

## 2017-09-30 NOTE — ED Notes (Signed)
Pt is saying more words at this time. He still will not tell me his name and states he does not know where he is at.

## 2017-09-30 NOTE — ED Triage Notes (Signed)
Per ems pt family states pt not drinking fluids, blood in urine and non verbal.

## 2017-09-30 NOTE — ED Notes (Signed)
Lactic acid ran on patient. 3.41. Reported to [employer] [facility].

## 2017-10-01 ENCOUNTER — Inpatient Hospital Stay: Payer: Self-pay

## 2017-10-01 ENCOUNTER — Inpatient Hospital Stay (HOSPITAL_COMMUNITY): Payer: BLUE CROSS/BLUE SHIELD

## 2017-10-01 DIAGNOSIS — Z960 Presence of urogenital implants: Secondary | ICD-10-CM

## 2017-10-01 DIAGNOSIS — Z87891 Personal history of nicotine dependence: Secondary | ICD-10-CM

## 2017-10-01 DIAGNOSIS — N183 Chronic kidney disease, stage 3 (moderate): Secondary | ICD-10-CM

## 2017-10-01 DIAGNOSIS — Z95828 Presence of other vascular implants and grafts: Secondary | ICD-10-CM

## 2017-10-01 DIAGNOSIS — G9340 Encephalopathy, unspecified: Secondary | ICD-10-CM

## 2017-10-01 DIAGNOSIS — N179 Acute kidney failure, unspecified: Secondary | ICD-10-CM

## 2017-10-01 DIAGNOSIS — Z833 Family history of diabetes mellitus: Secondary | ICD-10-CM

## 2017-10-01 DIAGNOSIS — J9601 Acute respiratory failure with hypoxia: Secondary | ICD-10-CM

## 2017-10-01 DIAGNOSIS — N390 Urinary tract infection, site not specified: Secondary | ICD-10-CM

## 2017-10-01 DIAGNOSIS — E119 Type 2 diabetes mellitus without complications: Secondary | ICD-10-CM

## 2017-10-01 DIAGNOSIS — B9562 Methicillin resistant Staphylococcus aureus infection as the cause of diseases classified elsewhere: Secondary | ICD-10-CM

## 2017-10-01 DIAGNOSIS — Z9911 Dependence on respirator [ventilator] status: Secondary | ICD-10-CM

## 2017-10-01 DIAGNOSIS — R6521 Severe sepsis with septic shock: Secondary | ICD-10-CM

## 2017-10-01 DIAGNOSIS — R7881 Bacteremia: Secondary | ICD-10-CM

## 2017-10-01 DIAGNOSIS — B9620 Unspecified Escherichia coli [E. coli] as the cause of diseases classified elsewhere: Secondary | ICD-10-CM

## 2017-10-01 DIAGNOSIS — A419 Sepsis, unspecified organism: Secondary | ICD-10-CM

## 2017-10-01 DIAGNOSIS — C9000 Multiple myeloma not having achieved remission: Secondary | ICD-10-CM

## 2017-10-01 DIAGNOSIS — Z87442 Personal history of urinary calculi: Secondary | ICD-10-CM

## 2017-10-01 DIAGNOSIS — R319 Hematuria, unspecified: Secondary | ICD-10-CM

## 2017-10-01 LAB — BLOOD GAS, ARTERIAL
Acid-base deficit: 7.9 mmol/L — ABNORMAL HIGH (ref 0.0–2.0)
Bicarbonate: 16.6 mmol/L — ABNORMAL LOW (ref 20.0–28.0)
Delivery systems: POSITIVE
Drawn by: 418751
Expiratory PAP: 6
O2 Content: 5 L/min
O2 Saturation: 99 %
Patient temperature: 98.6
pCO2 arterial: 30.5 mmHg — ABNORMAL LOW (ref 32.0–48.0)
pH, Arterial: 7.355 (ref 7.350–7.450)
pO2, Arterial: 184 mmHg — ABNORMAL HIGH (ref 83.0–108.0)

## 2017-10-01 LAB — POCT I-STAT 3, ART BLOOD GAS (G3+)
Acid-base deficit: 9 mmol/L — ABNORMAL HIGH (ref 0.0–2.0)
Acid-base deficit: 9 mmol/L — ABNORMAL HIGH (ref 0.0–2.0)
Bicarbonate: 16 mmol/L — ABNORMAL LOW (ref 20.0–28.0)
Bicarbonate: 17.7 mmol/L — ABNORMAL LOW (ref 20.0–28.0)
O2 Saturation: 98 %
O2 Saturation: 98 %
Patient temperature: 98.6
Patient temperature: 98.6
TCO2: 17 mmol/L — ABNORMAL LOW (ref 22–32)
TCO2: 19 mmol/L — ABNORMAL LOW (ref 22–32)
pCO2 arterial: 30.1 mmHg — ABNORMAL LOW (ref 32.0–48.0)
pCO2 arterial: 42.9 mmHg (ref 32.0–48.0)
pH, Arterial: 7.333 — ABNORMAL LOW (ref 7.350–7.450)
pH, Arterial: 7.223 — ABNORMAL LOW (ref 7.350–7.450)
pO2, Arterial: 113 mmHg — ABNORMAL HIGH (ref 83.0–108.0)
pO2, Arterial: 131 mmHg — ABNORMAL HIGH (ref 83.0–108.0)

## 2017-10-01 LAB — CBC
HCT: 31 % — ABNORMAL LOW (ref 39.0–52.0)
Hemoglobin: 10.1 g/dL — ABNORMAL LOW (ref 13.0–17.0)
MCH: 30.2 pg (ref 26.0–34.0)
MCHC: 32.6 g/dL (ref 30.0–36.0)
MCV: 92.8 fL (ref 78.0–100.0)
Platelets: 113 10*3/uL — ABNORMAL LOW (ref 150–400)
RBC: 3.34 MIL/uL — ABNORMAL LOW (ref 4.22–5.81)
RDW: 17.6 % — ABNORMAL HIGH (ref 11.5–15.5)
WBC: 27.8 10*3/uL — ABNORMAL HIGH (ref 4.0–10.5)

## 2017-10-01 LAB — BLOOD CULTURE ID PANEL (REFLEXED)
Acinetobacter baumannii: NOT DETECTED
Candida albicans: NOT DETECTED
Candida glabrata: NOT DETECTED
Candida krusei: NOT DETECTED
Candida parapsilosis: NOT DETECTED
Candida tropicalis: NOT DETECTED
Carbapenem resistance: NOT DETECTED
Enterobacter cloacae complex: NOT DETECTED
Enterobacteriaceae species: DETECTED — AB
Enterococcus species: NOT DETECTED
Escherichia coli: DETECTED — AB
Haemophilus influenzae: NOT DETECTED
Klebsiella oxytoca: NOT DETECTED
Klebsiella pneumoniae: NOT DETECTED
Listeria monocytogenes: NOT DETECTED
Methicillin resistance: DETECTED — AB
Neisseria meningitidis: NOT DETECTED
Proteus species: NOT DETECTED
Pseudomonas aeruginosa: NOT DETECTED
Serratia marcescens: NOT DETECTED
Staphylococcus aureus (BCID): DETECTED — AB
Staphylococcus species: DETECTED — AB
Streptococcus agalactiae: NOT DETECTED
Streptococcus pneumoniae: NOT DETECTED
Streptococcus pyogenes: NOT DETECTED
Streptococcus species: NOT DETECTED

## 2017-10-01 LAB — GLUCOSE, CAPILLARY
Glucose-Capillary: 107 mg/dL — ABNORMAL HIGH (ref 65–99)
Glucose-Capillary: 123 mg/dL — ABNORMAL HIGH (ref 65–99)
Glucose-Capillary: 122 mg/dL — ABNORMAL HIGH (ref 65–99)
Glucose-Capillary: 139 mg/dL — ABNORMAL HIGH (ref 65–99)
Glucose-Capillary: 141 mg/dL — ABNORMAL HIGH (ref 65–99)
Glucose-Capillary: 144 mg/dL — ABNORMAL HIGH (ref 65–99)

## 2017-10-01 LAB — TYPE AND SCREEN
ABO/RH(D): O POS
Antibody Screen: NEGATIVE

## 2017-10-01 LAB — RENAL FUNCTION PANEL
Albumin: 2.5 g/dL — ABNORMAL LOW (ref 3.5–5.0)
Anion gap: 10 (ref 5–15)
BUN: 50 mg/dL — ABNORMAL HIGH (ref 6–20)
CO2: 18 mmol/L — ABNORMAL LOW (ref 22–32)
Calcium: 7.6 mg/dL — ABNORMAL LOW (ref 8.9–10.3)
Chloride: 106 mmol/L (ref 101–111)
Creatinine, Ser: 3.44 mg/dL — ABNORMAL HIGH (ref 0.61–1.24)
GFR calc Af Amer: 21 mL/min — ABNORMAL LOW (ref >60)
GFR calc non Af Amer: 18 mL/min — ABNORMAL LOW (ref >60)
Glucose, Bld: 126 mg/dL — ABNORMAL HIGH (ref 65–99)
Phosphorus: 3.3 mg/dL (ref 2.5–4.6)
Potassium: 4.9 mmol/L (ref 3.5–5.1)
Sodium: 134 mmol/L — ABNORMAL LOW (ref 135–145)

## 2017-10-01 LAB — APTT
aPTT: 70 seconds — ABNORMAL HIGH (ref 24–36)
aPTT: 79 seconds — ABNORMAL HIGH (ref 24–36)

## 2017-10-01 LAB — PROCALCITONIN: Procalcitonin: >150 ng/mL

## 2017-10-01 LAB — LACTIC ACID, PLASMA
Lactic Acid, Venous: 2.6 mmol/L (ref 0.5–1.9)
Lactic Acid, Venous: 1.9 mmol/L (ref 0.5–1.9)

## 2017-10-01 LAB — HEPARIN LEVEL (UNFRACTIONATED)
Heparin Unfractionated: >2.2 IU/mL — ABNORMAL HIGH (ref 0.30–0.70)
Heparin Unfractionated: >2.2 IU/mL — ABNORMAL HIGH (ref 0.30–0.70)

## 2017-10-01 LAB — PROTIME-INR
INR: 2.79
Prothrombin Time: 29.2 seconds — ABNORMAL HIGH (ref 11.4–15.2)

## 2017-10-01 LAB — TROPONIN I
Troponin I: 0.16 ng/mL (ref <0.03)
Troponin I: 0.16 ng/mL (ref <0.03)

## 2017-10-01 LAB — MRSA PCR SCREENING: MRSA by PCR: POSITIVE — AB

## 2017-10-01 LAB — PHOSPHORUS: Phosphorus: 3.2 mg/dL (ref 2.5–4.6)

## 2017-10-01 LAB — CORTISOL: Cortisol, Plasma: 17.6 ug/dL

## 2017-10-01 LAB — MAGNESIUM: Magnesium: 1.4 mg/dL — ABNORMAL LOW (ref 1.7–2.4)

## 2017-10-01 LAB — ABO/RH: ABO/RH(D): O POS

## 2017-10-01 MED ORDER — SODIUM CHLORIDE 0.9 % IV SOLN: 250.0000 mL | INTRAVENOUS | Status: DC | PRN

## 2017-10-01 MED ORDER — CHLORHEXIDINE GLUCONATE CLOTH 2 % EX PADS
6.0000 | MEDICATED_PAD | Freq: Every day | CUTANEOUS | Status: DC
  Administered 2017-10-02 – 2017-10-04 (×4): 6 via TOPICAL

## 2017-10-01 MED ORDER — DEXMEDETOMIDINE HCL IN NACL 400 MCG/100ML IV SOLN
0.0000 ug/kg/h | INTRAVENOUS | Status: DC
  Administered 2017-10-01: 0.4 ug/kg/h via INTRAVENOUS
  Administered 2017-10-02: 1 ug/kg/h via INTRAVENOUS
  Administered 2017-10-02: 0.6 ug/kg/h via INTRAVENOUS
  Administered 2017-10-02: 0.7 ug/kg/h via INTRAVENOUS
  Administered 2017-10-02: 0.9 ug/kg/h via INTRAVENOUS
  Administered 2017-10-02 (×2): 0.8 ug/kg/h via INTRAVENOUS
  Administered 2017-10-03: 0.2 ug/kg/h via INTRAVENOUS
  Administered 2017-10-03: 0.7 ug/kg/h via INTRAVENOUS
  Filled 2017-10-01 (×12): qty 100

## 2017-10-01 MED ORDER — CHLORHEXIDINE GLUCONATE CLOTH 2 % EX PADS: 6.0000 | MEDICATED_PAD | Freq: Every day | CUTANEOUS | Status: DC

## 2017-10-01 MED ORDER — HEPARIN SODIUM (PORCINE) 5000 UNIT/ML IJ SOLN: 5000.0000 [IU] | Freq: Three times a day (TID) | INTRAMUSCULAR | Status: DC

## 2017-10-01 MED ORDER — ACETAMINOPHEN 325 MG PO TABS: 650.0000 mg | ORAL_TABLET | ORAL | Status: DC | PRN

## 2017-10-01 MED ORDER — MAGNESIUM SULFATE 2 GM/50ML IV SOLN
2.0000 g | Freq: Once | INTRAVENOUS | Status: AC
  Administered 2017-10-01: 2 g via INTRAVENOUS
  Filled 2017-10-01: qty 50

## 2017-10-01 MED ORDER — FENTANYL CITRATE (PF) 100 MCG/2ML IJ SOLN
100.0000 ug | INTRAMUSCULAR | Status: DC | PRN
  Administered 2017-10-01 – 2017-10-04 (×7): 100 ug via INTRAVENOUS
  Filled 2017-10-01 (×6): qty 2

## 2017-10-01 MED ORDER — CHLORHEXIDINE GLUCONATE 0.12 % MT SOLN
15.0000 mL | Freq: Two times a day (BID) | OROMUCOSAL | Status: DC
  Administered 2017-10-01 – 2017-10-02 (×2): 15 mL via OROMUCOSAL

## 2017-10-01 MED ORDER — ACETAMINOPHEN 325 MG PO TABS
650.0000 mg | ORAL_TABLET | Freq: Four times a day (QID) | ORAL | Status: DC | PRN
  Administered 2017-10-05: 650 mg via ORAL
  Filled 2017-10-01: qty 2

## 2017-10-01 MED ORDER — DEXMEDETOMIDINE HCL IN NACL 200 MCG/50ML IV SOLN
0.0000 ug/kg/h | INTRAVENOUS | Status: DC
  Administered 2017-10-01: 0.4 ug/kg/h via INTRAVENOUS
  Filled 2017-10-01 (×2): qty 50

## 2017-10-01 MED ORDER — MIDAZOLAM HCL 2 MG/2ML IJ SOLN
INTRAMUSCULAR | Status: AC
  Administered 2017-10-01: 2 mg
  Filled 2017-10-01: qty 2

## 2017-10-01 MED ORDER — INSULIN ASPART 100 UNIT/ML ~~LOC~~ SOLN: 1.0000 [IU] | SUBCUTANEOUS | Status: DC

## 2017-10-01 MED ORDER — FENTANYL CITRATE (PF) 100 MCG/2ML IJ SOLN: 50.0000 ug | Freq: Once | INTRAMUSCULAR | Status: DC

## 2017-10-01 MED ORDER — MIDAZOLAM HCL 2 MG/2ML IJ SOLN
INTRAMUSCULAR | Status: AC
  Filled 2017-10-01: qty 2

## 2017-10-01 MED ORDER — CIPROFLOXACIN IN D5W 400 MG/200ML IV SOLN
400.0000 mg | Freq: Two times a day (BID) | INTRAVENOUS | Status: DC
  Administered 2017-10-01 – 2017-10-02 (×2): 400 mg via INTRAVENOUS
  Filled 2017-10-01 (×2): qty 200

## 2017-10-01 MED ORDER — PIPERACILLIN-TAZOBACTAM 3.375 G IVPB
3.3750 g | Freq: Three times a day (TID) | INTRAVENOUS | Status: DC
  Administered 2017-10-01 (×2): 3.375 g via INTRAVENOUS
  Filled 2017-10-01 (×4): qty 50

## 2017-10-01 MED ORDER — VASOPRESSIN 20 UNIT/ML IV SOLN
0.4000 [IU]/min | INTRAVENOUS | Status: DC
  Administered 2017-10-01: 0.4 [IU]/min via INTRAVENOUS
  Filled 2017-10-01: qty 5

## 2017-10-01 MED ORDER — MUPIROCIN 2 % EX OINT
1.0000 "application " | TOPICAL_OINTMENT | Freq: Two times a day (BID) | CUTANEOUS | Status: AC
  Administered 2017-10-01 – 2017-10-05 (×10): 1 via NASAL
  Filled 2017-10-01 (×3): qty 22

## 2017-10-01 MED ORDER — ORAL CARE MOUTH RINSE
15.0000 mL | Freq: Two times a day (BID) | OROMUCOSAL | Status: DC
  Administered 2017-10-01 – 2017-10-02 (×3): 15 mL via OROMUCOSAL

## 2017-10-01 MED ORDER — VANCOMYCIN HCL 10 G IV SOLR
2000.0000 mg | INTRAVENOUS | Status: DC
Start: 2017-10-03 — End: 2017-10-02

## 2017-10-01 MED ORDER — SODIUM CHLORIDE 0.9% FLUSH
10.0000 mL | Freq: Two times a day (BID) | INTRAVENOUS | Status: DC
  Administered 2017-10-01 – 2017-10-03 (×5): 10 mL

## 2017-10-01 MED ORDER — ROCURONIUM BROMIDE 50 MG/5ML IV SOLN
1.0000 mg/kg | Freq: Once | INTRAVENOUS | Status: AC
  Administered 2017-10-01: 159.5 mg via INTRAVENOUS
  Filled 2017-10-01: qty 15.95

## 2017-10-01 MED ORDER — VANCOMYCIN HCL 10 G IV SOLR
1750.0000 mg | INTRAVENOUS | Status: DC
  Administered 2017-10-01: 1750 mg via INTRAVENOUS
  Filled 2017-10-01: qty 1750

## 2017-10-01 MED ORDER — VASOPRESSIN 20 UNIT/ML IV SOLN
0.0300 [IU]/min | INTRAVENOUS | Status: DC
  Administered 2017-10-01: 0.03 [IU]/min via INTRAVENOUS
  Filled 2017-10-01 (×2): qty 2

## 2017-10-01 MED ORDER — HEPARIN (PORCINE) IN NACL 100-0.45 UNIT/ML-% IJ SOLN
1600.0000 [IU]/h | INTRAMUSCULAR | Status: AC
  Administered 2017-10-01 – 2017-10-02 (×2): 1600 [IU]/h via INTRAVENOUS
  Filled 2017-10-01 (×2): qty 250

## 2017-10-01 MED ORDER — SODIUM CHLORIDE 0.9 % IV SOLN: 1750.0000 mg | INTRAVENOUS | Status: DC

## 2017-10-01 MED ORDER — MIDAZOLAM HCL 2 MG/2ML IJ SOLN
2.0000 mg | INTRAMUSCULAR | Status: DC | PRN
  Administered 2017-10-02: 2 mg via INTRAVENOUS
  Filled 2017-10-01: qty 2

## 2017-10-01 MED ORDER — NOREPINEPHRINE BITARTRATE 1 MG/ML IV SOLN
0.0000 ug/min | INTRAVENOUS | Status: DC
  Administered 2017-10-01: 20 ug/min via INTRAVENOUS
  Administered 2017-10-01: 18 ug/min via INTRAVENOUS
  Filled 2017-10-01 (×3): qty 16

## 2017-10-01 MED ORDER — ONDANSETRON HCL 4 MG/2ML IJ SOLN: 4.0000 mg | Freq: Four times a day (QID) | INTRAMUSCULAR | Status: DC | PRN

## 2017-10-01 MED ORDER — FAMOTIDINE IN NACL 20-0.9 MG/50ML-% IV SOLN
20.0000 mg | Freq: Two times a day (BID) | INTRAVENOUS | Status: DC
  Administered 2017-10-01 – 2017-10-05 (×9): 20 mg via INTRAVENOUS
  Filled 2017-10-01 (×9): qty 50

## 2017-10-01 MED ORDER — ORAL CARE MOUTH RINSE
15.0000 mL | Freq: Four times a day (QID) | OROMUCOSAL | Status: DC
  Administered 2017-10-02 – 2017-10-03 (×5): 15 mL via OROMUCOSAL

## 2017-10-01 MED ORDER — FENTANYL CITRATE (PF) 100 MCG/2ML IJ SOLN
INTRAMUSCULAR | Status: AC
  Administered 2017-10-01: 50 ug via INTRAVENOUS
  Filled 2017-10-01: qty 2

## 2017-10-01 MED ORDER — ETOMIDATE 2 MG/ML IV SOLN
0.3000 mg/kg | Freq: Once | INTRAVENOUS | Status: AC
  Administered 2017-10-01: 47.86 mg via INTRAVENOUS

## 2017-10-01 MED ORDER — FENTANYL CITRATE (PF) 100 MCG/2ML IJ SOLN
100.0000 ug | INTRAMUSCULAR | Status: DC | PRN
  Administered 2017-10-02: 100 ug via INTRAVENOUS
  Filled 2017-10-01 (×2): qty 2

## 2017-10-01 MED ORDER — CHLORHEXIDINE GLUCONATE 0.12% ORAL RINSE (MEDLINE KIT)
15.0000 mL | Freq: Two times a day (BID) | OROMUCOSAL | Status: DC
  Administered 2017-10-01 – 2017-10-02 (×3): 15 mL via OROMUCOSAL

## 2017-10-01 MED ORDER — FENTANYL 2500MCG IN NS 250ML (10MCG/ML) PREMIX INFUSION: 25.0000 ug/h | INTRAVENOUS | Status: DC

## 2017-10-01 MED ORDER — MIDAZOLAM HCL 2 MG/2ML IJ SOLN
2.0000 mg | INTRAMUSCULAR | Status: DC | PRN
  Administered 2017-10-03 – 2017-10-04 (×2): 2 mg via INTRAVENOUS
  Filled 2017-10-01 (×2): qty 2

## 2017-10-01 MED ORDER — STERILE WATER FOR INJECTION IV SOLN
INTRAVENOUS | Status: DC
  Administered 2017-10-01 – 2017-10-02 (×2): via INTRAVENOUS
  Filled 2017-10-01 (×5): qty 850

## 2017-10-01 MED ORDER — DEXMEDETOMIDINE HCL IN NACL 200 MCG/50ML IV SOLN
0.0000 ug/kg/h | INTRAVENOUS | Status: DC
Start: 2017-10-01 — End: 2017-10-01

## 2017-10-01 MED ORDER — INSULIN ASPART 100 UNIT/ML ~~LOC~~ SOLN
2.0000 [IU] | SUBCUTANEOUS | Status: DC
  Administered 2017-10-01 – 2017-10-02 (×6): 2 [IU] via SUBCUTANEOUS
  Administered 2017-10-02 (×2): 4 [IU] via SUBCUTANEOUS
  Administered 2017-10-02: 2 [IU] via SUBCUTANEOUS
  Administered 2017-10-02: 4 [IU] via SUBCUTANEOUS
  Administered 2017-10-02 – 2017-10-03 (×2): 2 [IU] via SUBCUTANEOUS
  Administered 2017-10-03: 4 [IU] via SUBCUTANEOUS

## 2017-10-01 MED ORDER — FENTANYL BOLUS VIA INFUSION: 50.0000 ug | INTRAVENOUS | Status: DC | PRN

## 2017-10-01 MED ORDER — SODIUM CHLORIDE 0.9 % IV SOLN
2.0000 g | Freq: Two times a day (BID) | INTRAVENOUS | Status: DC
  Administered 2017-10-01 – 2017-10-02 (×2): 2 g via INTRAVENOUS
  Filled 2017-10-01 (×3): qty 2

## 2017-10-01 MED ORDER — SODIUM CHLORIDE 0.9% FLUSH: 10.0000 mL | INTRAVENOUS | Status: DC | PRN

## 2017-10-01 MED ORDER — HYDROCORTISONE NA SUCCINATE PF 100 MG IJ SOLR
50.0000 mg | Freq: Four times a day (QID) | INTRAMUSCULAR | Status: DC
  Administered 2017-10-01 – 2017-10-02 (×7): 50 mg via INTRAVENOUS
  Filled 2017-10-01 (×7): qty 2

## 2017-10-01 MED ORDER — ACETAMINOPHEN 650 MG RE SUPP
650.0000 mg | Freq: Four times a day (QID) | RECTAL | Status: DC | PRN
  Administered 2017-10-01 – 2017-10-02 (×3): 650 mg via RECTAL
  Filled 2017-10-01 (×3): qty 1

## 2017-10-01 NOTE — Consult Note (Signed)
[place] for Infectious Disease    Date of Admission:  09/30/2017   Total days of antibiotics: 1 vanco/zosyn               Reason for Consult: MRSA bacteremia, E coli bacteremia    Referring Provider: Pennie Banter   Assessment: MRSA bacteremia E Coli bacteremia Multiple Myeloma Port  Plan: 1. Aggressive ICU support 2. Change anbx to cipro/cefepime/vanco 3. Remove port 4. Remove femoral line when possible (placed while bacteremic)  Comment- Although data on dual therapy for GNR bacteremia is mixed, pt is quite ill and would like to give him every opportunity for good outcome.  His underlying immune dysfunction is a large risk factor for his illness and would suggest a poor prognostic feature.    Thank you so much for this interesting consult,  Active Problems:   Septic shock ([place])   . chlorhexidine  15 mL Mouth Rinse BID  . Chlorhexidine Gluconate Cloth  6 each Topical Q0600  . [START ON 10/07/2017] Chlorhexidine Gluconate Cloth  6 each Topical Daily  . hydrocortisone sodium succinate  50 mg Intravenous Q6H  . insulin aspart  2-6 Units Subcutaneous Q4H  . mouth rinse  15 mL Mouth Rinse q12n4p  . midazolam      . mupirocin ointment  1 application Nasal BID  . sodium chloride flush  10-40 mL Intracatheter Q12H    HPI: [name] is a 57 y.o. male with hx of Multiple Myeloma, DM, recent ureteral stent (3-1 due to stone, Klebsiella bacteremia) comes to [facility] and then to [facility] with hypotension requiring pressors, tempt 102.2, mental status change. He was started on anbx and fluids and transferred to ICU at [facility].  At [facility] he was intubated due to sepsis.  He remains on pressors.   Review of Systems: Review of Systems  Unable to perform ROS: Intubated    Past Medical History:  Diagnosis Date  . Anemia   . Arteriosclerotic cardiovascular disease (ASCVD)    MI-2000s; stent to the proximal LAD and diagonal in 2001; stress nuclear in 2008-impaired exercise  capacity, left ventricular dilatation, moderately to severely depressed EF, apical, inferior and anteroseptal scar  . Arthritis   . Atrial flutter ([place])   . 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 Cellulitis of leg    both legs  . Chronic diarrhea   . Chronic kidney disease, stage 3, mod decreased GFR (HCC)    Creatinine of 1.84 in 06/2011 and 1.5 in 07/2011  . Diabetes mellitus    Insulin  . Dysrhythmia    AFlutter  . GERD (gastroesophageal reflux disease)   . Gout   . Hyperlipidemia   . Hypertension   . Injection site reaction   . Multiple myeloma 07/01/2011  . Myocardial infarction ([place]) 2000  . Obesity   . Pedal edema    Venous insufficiency  . Sleep apnea    uses cpap  . Ulcer     Social History   Tobacco Use  . Smoking status: Former Smoker    Packs/day: 0.25    Years: 1.00    Pack years: 0.25    Types: Cigarettes, Cigars    Last attempt to quit: 05/17/2001    Years since quitting: 16.3  . Smokeless tobacco: Never Used  Substance Use Topics  . Alcohol use: No    Alcohol/week: 0.0 oz  . Drug use: No    Family History  Problem Relation Age of Onset  .  Heart disease Mother   . Cancer Mother   . Diabetes Father   . Arthritis Unknown   . Anesthesia problems Neg Hx   . Hypotension Neg Hx   . Malignant hyperthermia Neg Hx   . Pseudochol deficiency Neg Hx      Medications:  Scheduled: . chlorhexidine  15 mL Mouth Rinse BID  . Chlorhexidine Gluconate Cloth  6 each Topical Q0600  . [START ON 10/07/2017] Chlorhexidine Gluconate Cloth  6 each Topical Daily  . hydrocortisone sodium succinate  50 mg Intravenous Q6H  . insulin aspart  2-6 Units Subcutaneous Q4H  . mouth rinse  15 mL Mouth Rinse q12n4p  . midazolam      . mupirocin ointment  1 application Nasal BID  . sodium chloride flush  10-40 mL Intracatheter Q12H    Abtx:  Anti-infectives (From admission, onward)   Start     Dose/Rate Route Frequency Ordered Stop   10/03/17 0600  vancomycin (VANCOCIN)  2,000 mg in sodium chloride 0.9 % 500 mL IVPB     2,000 mg 250 mL/hr over 120 Minutes Intravenous Every 48 hours 10/01/17 1352     10/02/17 0600  vancomycin (VANCOCIN) 1,750 mg in sodium chloride 0.9 % 500 mL IVPB  Status:  Discontinued     1,750 mg 250 mL/hr over 120 Minutes Intravenous Every 24 hours 10/01/17 1350 10/01/17 1352   10/01/17 0600  vancomycin (VANCOCIN) 1,750 mg in sodium chloride 0.9 % 500 mL IVPB  Status:  Discontinued     1,750 mg 250 mL/hr over 120 Minutes Intravenous Every 24 hours 10/01/17 0536 10/01/17 1255   10/01/17 0600  piperacillin-tazobactam (ZOSYN) IVPB 3.375 g     3.375 g 12.5 mL/hr over 240 Minutes Intravenous Every 8 hours 10/01/17 0536     09/30/17 1815  vancomycin (VANCOCIN) IVPB 1000 mg/200 mL premix     1,000 mg 200 mL/hr over 60 Minutes Intravenous  Once 09/30/17 1802 09/30/17 1928   09/30/17 1815  piperacillin-tazobactam (ZOSYN) IVPB 3.375 g     3.375 g 100 mL/hr over 30 Minutes Intravenous  Once 09/30/17 1802 09/30/17 1847        OBJECTIVE: Blood pressure (!) 73/52, pulse 81, temperature 98.7 F (37.1 C), temperature source Oral, resp. rate (!) 30, height '6\' 2"'$  (1.88 m), weight (!) 159.5 kg (351 lb 10.1 oz), SpO2 100 %.  Physical Exam  HENT:  Head: Normocephalic.  Eyes: No scleral icterus.  Neck: Normal range of motion. Neck supple.  Cardiovascular: Normal rate, regular rhythm and normal heart sounds.  Pulmonary/Chest: He has rhonchi.  Abdominal: Soft. He exhibits no distension. There is no tenderness.  Musculoskeletal: He exhibits no edema.  Lymphadenopathy:    He has no cervical adenopathy.  Skin:       Lab Results Results for orders placed or performed during the hospital encounter of 09/30/17 (from the past 48 hour(s))  Comprehensive metabolic panel     Status: Abnormal   Collection Time: 09/30/17  5:32 PM  Result Value Ref Range   Sodium 132 (L) 135 - 145 mmol/L   Potassium 4.6 3.5 - 5.1 mmol/L   Chloride 100 (L) 101 - 111  mmol/L   CO2 18 (L) 22 - 32 mmol/L   Glucose, Bld 100 (H) 65 - 99 mg/dL   BUN 54 (H) 6 - 20 mg/dL   Creatinine, Ser 3.48 (H) 0.61 - 1.24 mg/dL   Calcium 8.3 (L) 8.9 - 10.3 mg/dL   Total Protein 6.1 (L) 6.5 -  8.1 g/dL   Albumin 2.7 (L) 3.5 - 5.0 g/dL   AST 94 (H) 15 - 41 U/L   ALT 63 17 - 63 U/L   Alkaline Phosphatase 64 38 - 126 U/L   Total Bilirubin 0.9 0.3 - 1.2 mg/dL   GFR calc non Af Amer 18 (L) >60 mL/min   GFR calc Af Amer 21 (L) >60 mL/min    Comment: (NOTE) The eGFR has been calculated using the CKD EPI equation. This calculation has not been validated in all clinical situations. eGFR's persistently <60 mL/min signify possible Chronic Kidney Disease.    Anion gap 14 5 - 15    Comment: Performed at [facility], [address] [place], [place] [address]  CBC with Differential     Status: Abnormal   Collection Time: 09/30/17  5:32 PM  Result Value Ref Range   WBC 21.4 (H) 4.0 - 10.5 K/uL   RBC 3.51 (L) 4.22 - 5.81 MIL/uL   Hemoglobin 10.6 (L) 13.0 - 17.0 g/dL   HCT 32.9 (L) 39.0 - 52.0 %   MCV 93.7 78.0 - 100.0 fL   MCH 30.2 26.0 - 34.0 pg   MCHC 32.2 30.0 - 36.0 g/dL   RDW 17.5 (H) 11.5 - 15.5 %   Platelets 104 (L) 150 - 400 K/uL    Comment: SPECIMEN CHECKED FOR CLOTS PLATELET COUNT CONFIRMED BY SMEAR    Neutrophils Relative % 90 %   Neutro Abs 19.2 1.7 - 7.7 K/uL   Lymphocytes Relative 4 %   Lymphs Abs 0.9 0.7 - 4.0 K/uL   Monocytes Relative 6 %   Monocytes Absolute 1.3 (H) 0.1 - 1.0 K/uL   Eosinophils Relative 0 %   Eosinophils Absolute 0.0 0.0 - 0.7 K/uL   Basophils Relative 0 %   Basophils Absolute 0.0 0.0 - 0.1 K/uL   WBC Morphology WHITE COUNT CONFIRMED ON SMEAR     Comment: Performed at [facility], [address] [place], [place] [address]  Culture, blood (Routine x 2)     Status: None (Preliminary result)   Collection Time: 09/30/17  5:32 PM  Result Value Ref Range   Specimen Description      RIGHT ANTECUBITAL Performed at [facility],  [address], [place], [place] [address]    Special Requests      BOTTLES DRAWN AEROBIC AND ANAEROBIC Blood Culture adequate volume Performed at [facility], [address], [place], [place] [address]    Culture  Setup Time      GRAM NEGATIVE RODS ANAEROBIC BOTTLE Gram Stain Report Called to,Read Back By and Verified With: FIELDS S. AT [place] AT 0920A ON [id number] BY THOMPSON S. GRAM POSITIVE COCCI IN CLUSTERS RECOVERED FROM THE AEROBIC BOTTLE Gram Stain Report Called to,Read Back By and Verified With: JOHNSON,J. AT 2683 ON 10/01/2017 BY BAUGHAM,M. Performed at [place].  VALUE IS CONSISTENT WITH PREVIOUSLY REPORTED AND CALLED VALUE. Performed at [facility], [place] [address] [place], [place] [address]    Culture PENDING    Report Status PENDING   Urinalysis, Routine w reflex microscopic     Status: Abnormal   Collection Time: 09/30/17  5:32 PM  Result Value Ref Range   Color, Urine YELLOW YELLOW   APPearance TURBID (A) CLEAR   Specific Gravity, Urine 1.017 1.005 - 1.030   pH 7.0 5.0 - 8.0   Glucose, UA NEGATIVE NEGATIVE mg/dL   Hgb urine dipstick LARGE (A) NEGATIVE  Bilirubin Urine NEGATIVE NEGATIVE   Ketones, ur NEGATIVE NEGATIVE mg/dL   Protein, ur 100 (A) NEGATIVE mg/dL   Nitrite NEGATIVE NEGATIVE   Leukocytes, UA MODERATE (A) NEGATIVE   RBC / HPF 21-50 0 - 5 RBC/hpf   WBC, UA 21-50 0 - 5 WBC/hpf   Bacteria, UA RARE (A) NONE SEEN   Squamous Epithelial / LPF 0-5 0 - 5    Comment: Please note change in reference range. Performed at [facility], [address], [place], [place] [address]   Culture, blood (Routine x 2)     Status: None (Preliminary result)   Collection Time: 09/30/17  5:37 PM  Result Value Ref Range   Specimen Description      RIGHT ANTECUBITAL Performed at [place] [place], [place] [address]    Special Requests      BOTTLES DRAWN AEROBIC AND ANAEROBIC Blood Culture adequate volume Performed at [facility] [place], [place] [address]    Culture  Setup Time      GRAM NEGATIVE RODS ANEROBIC BOTTLE Gram Stain Report Called to,Read Back By and Verified With: FIELDS S. AT [facility] AT 0920A ON [id number] BY THOMPSON S. GRAM POSITIVE COCCI IN CLUSTERS REOVERED FROM THE AEROBIC BOTTLE. Gram Stain Report Called to,Read Back By and Verified With: JOHNSON,J. AT 1258 ON 10/01/2017 BY BAUGHAM,M. Performed at [facility] Organism ID to follow ANAEROBIC BOTTLE ONLY Performed at [facility], [place] [address], [place], [place] [address]    Culture PENDING    Report Status PENDING   Blood Culture ID Panel (Reflexed)     Status: Abnormal   Collection Time: 09/30/17  5:37 PM  Result Value Ref Range   Enterococcus species NOT DETECTED NOT DETECTED   Listeria monocytogenes NOT DETECTED NOT DETECTED   Staphylococcus species DETECTED (A) NOT DETECTED    Comment: CRITICAL RESULT CALLED TO, READ BACK BY AND VERIFIED WITH: Asencion Islam PharmD 16:05 10/01/17 (wilsonm)    Staphylococcus aureus DETECTED (A) NOT DETECTED    Comment: Methicillin (oxacillin)-resistant Staphylococcus aureus (MRSA). MRSA is predictably resistant to beta-lactam antibiotics (except ceftaroline). Preferred therapy is vancomycin unless clinically contraindicated. Patient requires contact precautions if  hospitalized. CRITICAL RESULT CALLED TO, READ BACK BY AND VERIFIED WITH: Asencion Islam PharmD 16:05 10/01/17 (wilsonm)    Methicillin resistance DETECTED (A) NOT DETECTED    Comment: CRITICAL RESULT CALLED TO, READ BACK BY AND VERIFIED WITH: B. Mancheril PharmD 16:05 10/01/17 (wilsonm)    Streptococcus species NOT DETECTED NOT DETECTED   Streptococcus agalactiae NOT DETECTED NOT DETECTED   Streptococcus pneumoniae NOT DETECTED NOT DETECTED   Streptococcus pyogenes NOT DETECTED NOT DETECTED   Acinetobacter baumannii NOT DETECTED NOT DETECTED   Enterobacteriaceae species DETECTED (A) NOT DETECTED    Comment:  Enterobacteriaceae represent a large family of gram-negative bacteria, not a single organism. CRITICAL RESULT CALLED TO, READ BACK BY AND VERIFIED WITH: Asencion Islam PharmD 16:05 10/01/17 (wilsonm)    Enterobacter cloacae complex NOT DETECTED NOT DETECTED   Escherichia coli DETECTED (A) NOT DETECTED    Comment: CRITICAL RESULT CALLED TO, READ BACK BY AND VERIFIED WITH: Asencion Islam PharmD 16:05 10/01/17 (wilsonm)    Klebsiella oxytoca NOT DETECTED NOT DETECTED   Klebsiella pneumoniae NOT DETECTED NOT DETECTED   Proteus species NOT DETECTED NOT DETECTED   Serratia marcescens NOT DETECTED NOT DETECTED   Carbapenem resistance NOT DETECTED NOT DETECTED   Haemophilus influenzae NOT DETECTED NOT DETECTED   Neisseria meningitidis  NOT DETECTED NOT DETECTED   Pseudomonas aeruginosa NOT DETECTED NOT DETECTED   Candida albicans NOT DETECTED NOT DETECTED   Candida glabrata NOT DETECTED NOT DETECTED   Candida krusei NOT DETECTED NOT DETECTED   Candida parapsilosis NOT DETECTED NOT DETECTED   Candida tropicalis NOT DETECTED NOT DETECTED    Comment: Performed at [facility], [place] [address], [place], [place] [address]  I-Stat CG4 Lactic Acid, ED     Status: Abnormal   Collection Time: 09/30/17  5:50 PM  Result Value Ref Range   Lactic Acid, Venous 3.77 (HH) 0.5 - 1.9 mmol/L   Comment NOTIFIED PHYSICIAN   Protime-INR     Status: Abnormal   Collection Time: 09/30/17  6:55 PM  Result Value Ref Range   Prothrombin Time 29.8 (H) 11.4 - 15.2 seconds   INR 2.86     Comment: Performed at [facility], [address], [place], [place] [address]  I-Stat CG4 Lactic Acid, ED     Status: Abnormal   Collection Time: 09/30/17  7:39 PM  Result Value Ref Range   Lactic Acid, Venous 3.41 (HH) 0.5 - 1.9 mmol/L  Glucose, capillary     Status: Abnormal   Collection Time: 10/01/17  1:39 AM  Result Value Ref Range   Glucose-Capillary 107 (H) 65 - 99 mg/dL  MRSA PCR Screening     Status: Abnormal   Collection  Time: 10/01/17  2:26 AM  Result Value Ref Range   MRSA by PCR POSITIVE (A) NEGATIVE    Comment:        The GeneXpert MRSA Assay (FDA approved for NASAL specimens only), is one component of a comprehensive MRSA colonization surveillance program. It is not intended to diagnose MRSA infection nor to guide or monitor treatment for MRSA infections. RESULT CALLED TO, READ BACK BY AND VERIFIED WITH: B SHEPARD RN 10/01/17 0438 JDW   I-STAT 3, arterial blood gas (G3+)     Status: Abnormal   Collection Time: 10/01/17  2:42 AM  Result Value Ref Range   pH, Arterial 7.333 (L) 7.350 - 7.450   pCO2 arterial 30.1 (L) 32.0 - 48.0 mmHg   pO2, Arterial 113.0 (H) 83.0 - 108.0 mmHg   Bicarbonate 16.0 (L) 20.0 - 28.0 mmol/L   TCO2 17 (L) 22 - 32 mmol/L   O2 Saturation 98.0 %   Acid-base deficit 9.0 (H) 0.0 - 2.0 mmol/L   Patient temperature 98.6 F    Collection site RADIAL, ALLEN'S TEST ACCEPTABLE    Drawn by RT    Sample type ARTERIAL   Renal function panel     Status: Abnormal   Collection Time: 10/01/17  2:57 AM  Result Value Ref Range   Sodium 134 (L) 135 - 145 mmol/L   Potassium 4.9 3.5 - 5.1 mmol/L   Chloride 106 101 - 111 mmol/L   CO2 18 (L) 22 - 32 mmol/L   Glucose, Bld 126 (H) 65 - 99 mg/dL   BUN 50 (H) 6 - 20 mg/dL   Creatinine, Ser 3.44 (H) 0.61 - 1.24 mg/dL   Calcium 7.6 (L) 8.9 - 10.3 mg/dL   Phosphorus 3.3 2.5 - 4.6 mg/dL   Albumin 2.5 (L) 3.5 - 5.0 g/dL   GFR calc non Af Amer 18 (L) >60 mL/min   GFR calc Af Amer 21 (L) >60 mL/min    Comment: (NOTE) The eGFR has been calculated using the CKD EPI equation. This calculation has not been validated in all clinical situations.  eGFR's persistently <60 mL/min signify possible Chronic Kidney Disease.    Anion gap 10 5 - 15    Comment: Performed at [facility] [address], [place], [place] [address]  Magnesium     Status: Abnormal   Collection Time: 10/01/17  2:57 AM  Result Value Ref Range   Magnesium 1.4 (L) 1.7 -  2.4 mg/dL    Comment: Performed at [facility] [address] [place], [place] [address]  Phosphorus     Status: None   Collection Time: 10/01/17  2:57 AM  Result Value Ref Range   Phosphorus 3.2 2.5 - 4.6 mg/dL    Comment: Performed at [facility] [address] [place], [place] [address]  Cortisol     Status: None   Collection Time: 10/01/17  2:57 AM  Result Value Ref Range   Cortisol, Plasma 17.6 ug/dL    Comment: (NOTE) AM    6.7 - 22.6 ug/dL PM   <10.0       ug/dL Performed at [facility] [address], [place], [place] [address]   CBC     Status: Abnormal   Collection Time: 10/01/17  2:57 AM  Result Value Ref Range   WBC 27.8 (H) 4.0 - 10.5 K/uL   RBC 3.34 (L) 4.22 - 5.81 MIL/uL   Hemoglobin 10.1 (L) 13.0 - 17.0 g/dL   HCT 31.0 (L) 39.0 - 52.0 %   MCV 92.8 78.0 - 100.0 fL   MCH 30.2 26.0 - 34.0 pg   MCHC 32.6 30.0 - 36.0 g/dL   RDW 17.6 (H) 11.5 - 15.5 %   Platelets 113 (L) 150 - 400 K/uL    Comment: REPEATED TO VERIFY SPECIMEN CHECKED FOR CLOTS PLATELET COUNT CONFIRMED BY SMEAR Performed at [facility] [address], [place], [place] [address]   Protime-INR     Status: Abnormal   Collection Time: 10/01/17  2:57 AM  Result Value Ref Range   Prothrombin Time 29.2 (H) 11.4 - 15.2 seconds   INR 2.79     Comment: Performed at [facility] [address], [place], [place] [address]  Procalcitonin - Baseline     Status: None   Collection Time: 10/01/17  2:57 AM  Result Value Ref Range   Procalcitonin >150.00 ng/mL    Comment:        Interpretation: PCT >= 10 ng/mL: Important systemic inflammatory response, almost exclusively due to severe bacterial sepsis or septic shock. (NOTE)       Sepsis PCT Algorithm           Lower Respiratory Tract                                      Infection PCT Algorithm    ----------------------------     ----------------------------         PCT < 0.25 ng/mL                PCT < 0.10 ng/mL          Strongly encourage             Strongly discourage   discontinuation of antibiotics    initiation of antibiotics    ----------------------------     -----------------------------       PCT 0.25 - 0.50 ng/mL  PCT 0.10 - 0.25 ng/mL               OR       >80% decrease in PCT            Discourage initiation of                                            antibiotics      Encourage discontinuation           of antibiotics    ----------------------------     -----------------------------         PCT >= 0.50 ng/mL              PCT 0.26 - 0.50 ng/mL                AND       <80% decrease in PCT             Encourage initiation of                                             antibiotics       Encourage continuation           of antibiotics    ----------------------------     -----------------------------        PCT >= 0.50 ng/mL                  PCT > 0.50 ng/mL               AND         increase in PCT                  Strongly encourage                                      initiation of antibiotics    Strongly encourage escalation           of antibiotics                                     -----------------------------                                           PCT <= 0.25 ng/mL                                                 OR                                        > 80% decrease in PCT  Discontinue / Do not initiate                                             antibiotics Performed at [facility], [place] [address] [place], [place] [address]   Lactic acid, plasma     Status: Abnormal   Collection Time: 10/01/17  3:00 AM  Result Value Ref Range   Lactic Acid, Venous 2.6 (HH) 0.5 - 1.9 mmol/L    Comment: CRITICAL RESULT CALLED TO, READ BACK BY AND VERIFIED WITH: SHEPHERD,B RN 10/01/2017 0351 JORDANS Performed at [facility], [place] [address], [place], [place] [address]   Glucose, capillary     Status: Abnormal   Collection  Time: 10/01/17  3:38 AM  Result Value Ref Range   Glucose-Capillary 123 (H) 65 - 99 mg/dL  Type and screen     Status: None   Collection Time: 10/01/17  3:45 AM  Result Value Ref Range   ABO/RH(D) O POS    Antibody Screen NEG    Sample Expiration      10/04/2017 Performed at [facility], [place] [address] [place], [place] [address]   ABO/Rh     Status: None   Collection Time: 10/01/17  3:45 AM  Result Value Ref Range   ABO/RH(D)      O POS Performed at [facility] [address], [place], [place] [address]   Blood gas, arterial     Status: Abnormal   Collection Time: 10/01/17  4:42 AM  Result Value Ref Range   O2 Content 5.0 L/min   Delivery systems CONTINUOUS POSITIVE AIRWAY PRESSURE    Expiratory PAP 6    pH, Arterial 7.355 7.350 - 7.450   pCO2 arterial 30.5 (L) 32.0 - 48.0 mmHg   pO2, Arterial 184 (H) 83.0 - 108.0 mmHg   Bicarbonate 16.6 (L) 20.0 - 28.0 mmol/L   Acid-base deficit 7.9 (H) 0.0 - 2.0 mmol/L   O2 Saturation 99.0 %   Patient temperature 98.6    Collection site A-LINE    Drawn by [id number]    Sample type ARTERIAL DRAW    Allens test (pass/fail) PASS PASS  Glucose, capillary     Status: Abnormal   Collection Time: 10/01/17  7:40 AM  Result Value Ref Range   Glucose-Capillary 122 (H) 65 - 99 mg/dL  Glucose, capillary     Status: Abnormal   Collection Time: 10/01/17 11:45 AM  Result Value Ref Range   Glucose-Capillary 139 (H) 65 - 99 mg/dL  Lactic acid, plasma     Status: None   Collection Time: 10/01/17 12:19 PM  Result Value Ref Range   Lactic Acid, Venous 1.9 0.5 - 1.9 mmol/L    Comment: Performed at [facility], [place] [address], [place], [place] [address]  Troponin I     Status: Abnormal   Collection Time: 10/01/17 12:19 PM  Result Value Ref Range   Troponin I 0.16 (HH) <0.03 ng/mL    Comment: CRITICAL RESULT CALLED TO, READ BACK BY AND VERIFIED WITH: I.BAYNES,RN 1403 10/01/17 CLARK,S Performed at [facility], [place] [address] [place], [place] [address]   APTT     Status: Abnormal   Collection Time: 10/01/17 12:19 PM  Result Value Ref Range   aPTT 70 (H) 24 - 36 seconds  Comment:        IF BASELINE aPTT IS ELEVATED, SUGGEST PATIENT RISK ASSESSMENT BE USED TO DETERMINE APPROPRIATE ANTICOAGULANT THERAPY. Performed at [facility], [place] [address], [place], [place] [address]   Heparin level (unfractionated)     Status: Abnormal   Collection Time: 10/01/17 12:19 PM  Result Value Ref Range   Heparin Unfractionated >2.20 (H) 0.30 - 0.70 IU/mL    Comment: RESULTS CONFIRMED BY MANUAL DILUTION IF HEPARIN RESULTS ARE BELOW EXPECTED VALUES, AND PATIENT DOSAGE HAS BEEN CONFIRMED, SUGGEST FOLLOW UP TESTING OF ANTITHROMBIN III LEVELS. Performed at [facility], [place] [address] [place], [place] [address]   Glucose, capillary     Status: Abnormal   Collection Time: 10/01/17  3:36 PM  Result Value Ref Range   Glucose-Capillary 141 (H) 65 - 99 mg/dL   *Note: Due to a large number of results and/or encounters for the requested time period, some results have not been displayed. A complete set of results can be found in Results Review.      Component Value Date/Time   SDES  09/30/2017 1737    RIGHT ANTECUBITAL Performed at [facility], [address] [place], [place] [address]    [facility]  09/30/2017 1737    BOTTLES DRAWN AEROBIC AND ANAEROBIC Blood Culture adequate volume Performed at [facility], [address], [place], [place] [address]    CULT PENDING 09/30/2017 1737   REPTSTATUS PENDING 09/30/2017 1737   Ct Abdomen Pelvis Wo Contrast  Result Date: 09/30/2017 CLINICAL DATA:  57 year old male presents with hematuria, sepsis and fever. Hypotension. History of obstructing ureteral stones. EXAM: CT ABDOMEN AND PELVIS WITHOUT CONTRAST TECHNIQUE: Multidetector CT imaging of the abdomen and pelvis was performed following the standard protocol without IV contrast. COMPARISON:  07/30/2017 FINDINGS:  Lower chest: New bibasilar pulmonary consolidations likely reflecting atelectasis, left greater than right. Superimposed pneumonia would be difficult to entirely exclude given history of fever and sepsis. Heart size is top normal in size without pericardial effusion. Hepatobiliary: 6 mm dependent gallstone within a non thickened but distended gallbladder. This could be due to a fasting state. The unenhanced liver is unremarkable. Pancreas: No ductal dilatation or mass given limitations of a noncontrast study. Spleen: Normal size spleen. Adrenals/Urinary Tract: Normal bilateral adrenal glands. Right-sided double pigtail ureteral stent is identified with the proximal coil in the right renal pelvis and distal coil in the decompressed urinary bladder. No calculus identified along the course of the ureteral stent. A Foley catheter decompresses the urinary bladder. Left kidney is unremarkable apart from a punctate nonobstructing left renal calculus. Moderate perinephric fat stranding is noted. Stomach/Bowel: Small hiatal hernia. Decompressed stomach with normal small bowel rotation. Normal appendix. Minimal scattered colonic diverticulosis without acute diverticulitis along the left colon. Vascular/Lymphatic: No aortic aneurysm. Aortoiliac and branch vessel atherosclerosis. No aneurysm or adenopathy. Reproductive: Normal size prostate and seminal vesicles. Other: No free air. Small fat containing umbilical hernia. Small fat containing right inguinal hernia. Musculoskeletal: Thoracolumbar spondylosis with multilevel degenerative disc disease most marked from L2 through S1 in the lumbar spine. Associated facet arthropathy is seen. IMPRESSION: 1. Uncomplicated cholelithiasis. 2. Nonobstructing left lower pole renal calculus. No hydroureteronephrosis. Stable right double pigtail ureteral stent in place with decompressed bladder containing a Foley catheter. 3. Patchy airspace opacities at each lung base, left greater than  right compatible with atelectasis. Minimal superimposed pneumonia is not excluded given history of fever and sepsis. 4. Thoracolumbar spondylosis. Electronically Signed   By: Shanon Brow  Randel Pigg M.D.   On: 09/30/2017 20:40   Dg Chest Port 1 View  Result Date: 09/30/2017 CLINICAL DATA:  Altered mental status. EXAM: PORTABLE CHEST 1 VIEW COMPARISON:  08/13/2017 FINDINGS: Right-sided power port remains in appropriate position. Low lung volumes are again seen. Rounded opacity in the left lung base is again seen which is consistent with a a left diaphragmatic hernia is as shown on previous CT. Cardiomegaly and pulmonary vascular congestion are stable. No evidence of acute infiltrate or pleural effusion. IMPRESSION: No acute findings. Stable cardiomegaly and pulmonary vascular congestion. Stable left diaphragmatic hernia. Electronically Signed   By: Earle Gell M.D.   On: 09/30/2017 18:41   [place] Ekg Site Rite  Result Date: 10/01/2017 If Site Rite image not attached, placement could not be confirmed due to current cardiac rhythm.  Recent Results (from the past 240 hour(s))  Culture, blood (Routine x 2)     Status: None (Preliminary result)   Collection Time: 09/30/17  5:32 PM  Result Value Ref Range Status   Specimen Description   Final    RIGHT ANTECUBITAL Performed at [facility], [address], [place], [place] [address]    Special Requests   Final    BOTTLES DRAWN AEROBIC AND ANAEROBIC Blood Culture adequate volume Performed at [facility], [address], [place], [place] [address]    Culture  Setup Time   Final    GRAM NEGATIVE RODS ANAEROBIC BOTTLE Gram Stain Report Called to,Read Back By and Verified With: FIELDS S. AT [place] AT 0920A ON [id number] BY THOMPSON S. GRAM POSITIVE COCCI IN CLUSTERS RECOVERED FROM THE AEROBIC BOTTLE Gram Stain Report Called to,Read Back By and Verified With: JOHNSON,J. AT 6962 ON 10/01/2017 BY BAUGHAM,M. Performed at [place].  VALUE IS  CONSISTENT WITH PREVIOUSLY REPORTED AND CALLED VALUE. Performed at [facility], [place] [address] [place], [place] [address]    Culture PENDING  Incomplete   Report Status PENDING  Incomplete  Culture, blood (Routine x 2)     Status: None (Preliminary result)   Collection Time: 09/30/17  5:37 PM  Result Value Ref Range Status   Specimen Description   Final    RIGHT ANTECUBITAL Performed at [facility], [address], [place], [place] [address]    Special Requests   Final    BOTTLES DRAWN AEROBIC AND ANAEROBIC Blood Culture adequate volume Performed at [facility], [address], [place],  [address]    Culture  Setup Time   Final    GRAM NEGATIVE RODS ANEROBIC BOTTLE Gram Stain Report Called to,Read Back By and Verified With: FIELDS S. AT [facility] AT 0920A ON [id number] BY THOMPSON S. GRAM POSITIVE COCCI IN CLUSTERS REOVERED FROM THE AEROBIC BOTTLE. Gram Stain Report Called to,Read Back By and Verified With: JOHNSON,J. AT 1258 ON 10/01/2017 BY BAUGHAM,M. Performed at [facility] Organism ID to follow ANAEROBIC BOTTLE ONLY Performed at [facility], [place] [address], [place],  [address]    Culture PENDING  Incomplete   Report Status PENDING  Incomplete  Blood Culture ID Panel (Reflexed)     Status: Abnormal   Collection Time: 09/30/17  5:37 PM  Result Value Ref Range Status   Enterococcus species NOT DETECTED NOT DETECTED Final   Listeria monocytogenes NOT DETECTED NOT DETECTED Final   Staphylococcus species DETECTED (A) NOT DETECTED Final    Comment: CRITICAL RESULT CALLED TO, READ BACK BY AND VERIFIED WITH: Asencion Islam PharmD 16:05  10/01/17 ([email or web address])    Staphylococcus aureus DETECTED (A) NOT DETECTED Final    Comment: Methicillin (oxacillin)-resistant Staphylococcus aureus (MRSA). MRSA is predictably resistant to beta-lactam antibiotics (except ceftaroline). Preferred therapy is vancomycin unless clinically contraindicated. Patient requires contact  precautions if  hospitalized. CRITICAL RESULT CALLED TO, READ BACK BY AND VERIFIED WITH: Asencion Islam PharmD 16:05 10/01/17 (wilsonm)    Methicillin resistance DETECTED (A) NOT DETECTED Final    Comment: CRITICAL RESULT CALLED TO, READ BACK BY AND VERIFIED WITH: Asencion Islam PharmD 16:05 10/01/17 (wilsonm)    Streptococcus species NOT DETECTED NOT DETECTED Final   Streptococcus agalactiae NOT DETECTED NOT DETECTED Final   Streptococcus pneumoniae NOT DETECTED NOT DETECTED Final   Streptococcus pyogenes NOT DETECTED NOT DETECTED Final   Acinetobacter baumannii NOT DETECTED NOT DETECTED Final   Enterobacteriaceae species DETECTED (A) NOT DETECTED Final    Comment: Enterobacteriaceae represent a large family of gram-negative bacteria, not a single organism. CRITICAL RESULT CALLED TO, READ BACK BY AND VERIFIED WITH: Jacinto Reap Mancheril PharmD 16:05 10/01/17 (wilsonm)    Enterobacter cloacae complex NOT DETECTED NOT DETECTED Final   Escherichia coli DETECTED (A) NOT DETECTED Final    Comment: CRITICAL RESULT CALLED TO, READ BACK BY AND VERIFIED WITH: Asencion Islam PharmD 16:05 10/01/17 (wilsonm)    Klebsiella oxytoca NOT DETECTED NOT DETECTED Final   Klebsiella pneumoniae NOT DETECTED NOT DETECTED Final   Proteus species NOT DETECTED NOT DETECTED Final   Serratia marcescens NOT DETECTED NOT DETECTED Final   Carbapenem resistance NOT DETECTED NOT DETECTED Final   Haemophilus influenzae NOT DETECTED NOT DETECTED Final   Neisseria meningitidis NOT DETECTED NOT DETECTED Final   Pseudomonas aeruginosa NOT DETECTED NOT DETECTED Final   Candida albicans NOT DETECTED NOT DETECTED Final   Candida glabrata NOT DETECTED NOT DETECTED Final   Candida krusei NOT DETECTED NOT DETECTED Final   Candida parapsilosis NOT DETECTED NOT DETECTED Final   Candida tropicalis NOT DETECTED NOT DETECTED Final    Comment: Performed at [facility], [place] [address], [place], [place] [address]  MRSA PCR Screening     Status:  Abnormal   Collection Time: 10/01/17  2:26 AM  Result Value Ref Range Status   MRSA by PCR POSITIVE (A) NEGATIVE Final    Comment:        The GeneXpert MRSA Assay (FDA approved for NASAL specimens only), is one component of a comprehensive MRSA colonization surveillance program. It is not intended to diagnose MRSA infection nor to guide or monitor treatment for MRSA infections. RESULT CALLED TO, READ BACK BY AND VERIFIED WITH: B SHEPARD RN 10/01/17 0438 JDW     Microbiology: Recent Results (from the past 240 hour(s))  Culture, blood (Routine x 2)     Status: None (Preliminary result)   Collection Time: 09/30/17  5:32 PM  Result Value Ref Range Status   Specimen Description   Final    RIGHT ANTECUBITAL Performed at [facility], [address] [place], [place] [address]    Special Requests   Final    BOTTLES DRAWN AEROBIC AND ANAEROBIC Blood Culture adequate volume Performed at [place]., [place],  [address]    Culture  Setup Time   Final    GRAM NEGATIVE RODS ANAEROBIC BOTTLE Gram Stain Report Called to,Read Back By and Verified With: FIELDS S. AT [place] AT 0920A ON [id number] BY THOMPSON S. GRAM POSITIVE COCCI IN CLUSTERS RECOVERED FROM THE AEROBIC BOTTLE Gram Stain Report Called to,Read Back By  and Verified With: JOHNSON,J. AT 2725 ON 10/01/2017 BY BAUGHAM,M. Performed at [place].  VALUE IS CONSISTENT WITH PREVIOUSLY REPORTED AND CALLED VALUE. Performed at [facility], [place] [address] [place], [place] [address]    Culture PENDING  Incomplete   Report Status PENDING  Incomplete  Culture, blood (Routine x 2)     Status: None (Preliminary result)   Collection Time: 09/30/17  5:37 PM  Result Value Ref Range Status   Specimen Description   Final    RIGHT ANTECUBITAL Performed at [facility], [address] [place], [place]  [address]    Special Requests   Final    BOTTLES DRAWN AEROBIC AND ANAEROBIC Blood Culture  adequate volume Performed at [facility], [address] [place], [place] [address]    Culture  Setup Time   Final    GRAM NEGATIVE RODS ANEROBIC BOTTLE Gram Stain Report Called to,Read Back By and Verified With: FIELDS S. AT [facility] AT 0920A ON [id number] BY THOMPSON S. GRAM POSITIVE COCCI IN CLUSTERS REOVERED FROM THE AEROBIC BOTTLE. Gram Stain Report Called to,Read Back By and Verified With: JOHNSON,J. AT 1258 ON 10/01/2017 BY BAUGHAM,M. Performed at [facility] Organism ID to follow ANAEROBIC BOTTLE ONLY Performed at [facility], [place] [address], [place], [place] [address]    Culture PENDING  Incomplete   Report Status PENDING  Incomplete  Blood Culture ID Panel (Reflexed)     Status: Abnormal   Collection Time: 09/30/17  5:37 PM  Result Value Ref Range Status   Enterococcus species NOT DETECTED NOT DETECTED Final   Listeria monocytogenes NOT DETECTED NOT DETECTED Final   Staphylococcus species DETECTED (A) NOT DETECTED Final    Comment: CRITICAL RESULT CALLED TO, READ BACK BY AND VERIFIED WITH: Asencion Islam PharmD 16:05 10/01/17 (wilsonm)    Staphylococcus aureus DETECTED (A) NOT DETECTED Final    Comment: Methicillin (oxacillin)-resistant Staphylococcus aureus (MRSA). MRSA is predictably resistant to beta-lactam antibiotics (except ceftaroline). Preferred therapy is vancomycin unless clinically contraindicated. Patient requires contact precautions if  hospitalized. CRITICAL RESULT CALLED TO, READ BACK BY AND VERIFIED WITH: Asencion Islam PharmD 16:05 10/01/17 (wilsonm)    Methicillin resistance DETECTED (A) NOT DETECTED Final    Comment: CRITICAL RESULT CALLED TO, READ BACK BY AND VERIFIED WITH: Asencion Islam PharmD 16:05 10/01/17 (wilsonm)    Streptococcus species NOT DETECTED NOT DETECTED Final   Streptococcus agalactiae NOT DETECTED NOT DETECTED Final   Streptococcus pneumoniae NOT DETECTED NOT DETECTED Final   Streptococcus pyogenes NOT DETECTED NOT DETECTED Final    Acinetobacter baumannii NOT DETECTED NOT DETECTED Final   Enterobacteriaceae species DETECTED (A) NOT DETECTED Final    Comment: Enterobacteriaceae represent a large family of gram-negative bacteria, not a single organism. CRITICAL RESULT CALLED TO, READ BACK BY AND VERIFIED WITH: Jacinto Reap Mancheril PharmD 16:05 10/01/17 (wilsonm)    Enterobacter cloacae complex NOT DETECTED NOT DETECTED Final   Escherichia coli DETECTED (A) NOT DETECTED Final    Comment: CRITICAL RESULT CALLED TO, READ BACK BY AND VERIFIED WITH: Asencion Islam PharmD 16:05 10/01/17 (wilsonm)    Klebsiella oxytoca NOT DETECTED NOT DETECTED Final   Klebsiella pneumoniae NOT DETECTED NOT DETECTED Final   Proteus species NOT DETECTED NOT DETECTED Final   Serratia marcescens NOT DETECTED NOT DETECTED Final   Carbapenem resistance NOT DETECTED NOT DETECTED Final   Haemophilus influenzae NOT DETECTED NOT DETECTED Final   Neisseria meningitidis NOT DETECTED NOT DETECTED Final   Pseudomonas aeruginosa  NOT DETECTED NOT DETECTED Final   Candida albicans NOT DETECTED NOT DETECTED Final   Candida glabrata NOT DETECTED NOT DETECTED Final   Candida krusei NOT DETECTED NOT DETECTED Final   Candida parapsilosis NOT DETECTED NOT DETECTED Final   Candida tropicalis NOT DETECTED NOT DETECTED Final    Comment: Performed at [facility], [place] [address], [place], [place] [address]  MRSA PCR Screening     Status: Abnormal   Collection Time: 10/01/17  2:26 AM  Result Value Ref Range Status   MRSA by PCR POSITIVE (A) NEGATIVE Final    Comment:        The GeneXpert MRSA Assay (FDA approved for NASAL specimens only), is one component of a comprehensive MRSA colonization surveillance program. It is not intended to diagnose MRSA infection nor to guide or monitor treatment for MRSA infections. RESULT CALLED TO, READ BACK BY AND VERIFIED WITH: B SHEPARD RN 10/01/17 0438 JDW     Radiographs and labs were personally reviewed by me.   Bobby Rumpf, MD [facility] [phone] 10/01/2017, 5:22 PM

## 2017-10-01 NOTE — H&P (Signed)
PULMONARY / CRITICAL CARE MEDICINE   Name: [name] MRN: [id number] DOB: 09-14-1960    ADMISSION DATE:  09/30/2017 CONSULTATION DATE:  5/1  REFERRING MD:  Dr. Wilson Singer  CHIEF COMPLAINT:  Septic shock   HISTORY OF PRESENT ILLNESS:   HPI obtained from medical chart review as patient is acute encephalopathic.   57 year old male with extensive PMH significant for but not limited to morbid obesity, multiple myeloma on chronic steroids, systolic HF, HTN, GERD, aflutter on Xarelto, CKD stage 3, anemia, DM, chronic foley who presented to [facility] ER with altered mental status.  Patient with fournier's gangrene in 04/2017 and since has frequent hospitalizations with debilitation.  Returned home from rehab in December and since getting home health and PT.    Recent Klebsiella bacteremia from obstructing ureteral stone with ureteral stent placement 3/1 and chronic foley.    Wife reports he is at baseline very coherent and had progressed to walking 100 steps with PT and using walker.  Home health exchanged foley on 4/30 and afterwards reports decline.  Developed altered mental status, decreased PO intake, and fever.   On arrival to ER, patient febrile, minimally responsive, and hypotensive.  Labs noted for WBC 21.4, Hgb 10.6, plts 104, sCr 3.48 (up from 1.54 on 3/15), Na 132, lactic 3.77 with repeat to 3.41.  Received 4.5 L NS and central line placed for vasopressors.  Empirically started on vancomycin and zosyn after blood and urine cultures sent.  Transferred to [facility] for further care.     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  PAST SURGICAL HISTORY: He  has a past surgical history that includes Laparoscopic gastric banding (2006); Wrist surgery; Incision and drainage abscess anal; Abscess drainage; Bone marrow biopsy (05/13/11); Portacath placement (07/07/2011); Colonoscopy (11/28/2011); Esophagogastroduodenoscopy (01/02/2012); biopsy (01/02/2012); Esophagogastroduodenoscopy (N/A, 09/20/2012); EUS (N/A, 10/07/2012); Cardioversion (N/A, 10/13/2012); Cardiac catheterization; Port-a-cath removal (Left, 12/07/2012); Portacath placement (N/A, 12/07/2012); Cataract extraction w/PHACO (Left, 02/13/2014); Cataract extraction w/PHACO (Right, 03/02/2014); Wound debridement (Right, 04/08/2017); Ostectomy (Right, 04/08/2017); Incision and drainage abscess (N/A, 04/14/2017); Irrigation and debridement abscess (N/A, 04/15/2017); Irrigation and debridement abscess (N/A, 04/17/2017); and Cystoscopy w/ ureteral stent placement (Right, 07/31/2017).  No Known Allergies  Current Facility-Administered Medications on File Prior to Encounter  Medication  . sodium chloride 0.9 % injection 10 mL   Current Outpatient Medications on File Prior to Encounter  Medication Sig  . acetaminophen (TYLENOL) 500 MG tablet Take 500-1,000 mg by mouth every 6 (six) hours as needed for mild pain or moderate pain.  Marland Kitchen acyclovir (ZOVIRAX) 400 MG tablet Take 1 tablet (400 mg total) by mouth every morning.  Marland Kitchen allopurinol (ZYLOPRIM) 300 MG tablet TAKE ONE TABLET BY MOUTH ONCE DAILY.  Marland Kitchen Artificial Tear Ointment (DRY EYES OP) Place 1 drop into both eyes as needed (dry eyes).  Marland Kitchen aspirin EC 81 MG tablet Take 81 mg by mouth daily.  Marland Kitchen atorvastatin (LIPITOR) 80 MG tablet TAKE ONE TABLET BY MOUTH AT BEDTIME.  . calcitRIOL (ROCALTROL) 0.25 MCG capsule Take 0.25 mcg by mouth 3 (three) times a week. Monday, Wednesday, Friday.  . Calcium Carbonate (CALCIUM 600 PO) Take 1 tablet  by mouth  2 (two) times daily.  Marland Kitchen dexlansoprazole (DEXILANT) 60 MG capsule Take 60 mg by mouth every morning.  . dicyclomine (BENTYL) 20 MG tablet TAKE 1 TABLET BY MOUTH BEFORE MEALS 3 TIMES DAILY.  Marland Kitchen Eluxadoline (VIBERZI) 100 MG TABS Take 100 mg 2 (two) times daily with a meal by mouth.   . gabapentin (NEURONTIN) 300 MG capsule Take 1 capsule (300 mg total) by mouth 2 (two) times daily.  . insulin detemir (LEVEMIR) 100 UNIT/ML injection Inject 0.1 mLs (10 Units total) into the skin every morning.  . insulin lispro (HUMALOG) 100 UNIT/ML injection Inject 0-0.1 mLs (0-10 Units total) into the skin 2 (two) times daily with a meal. Sliding Scale per patient  CBG 150-200: 1 units CBG 201-275:  3 units CBG 275-350: 5 units 351-425: 7 units 425-500: 9 units 500+: 10 units  . loratadine (CLARITIN) 10 MG tablet Take 10 mg by mouth every morning.   . Magnesium Cl-Calcium Carbonate (SLOW-MAG PO) Take 1 tablet by mouth every morning.  . methocarbamol (ROBAXIN) 500 MG tablet Take 1 tablet (500 mg total) by mouth every 8 (eight) hours as needed for muscle spasms.  . Multiple Vitamins-Minerals (MULTIVITAMINS THER. W/MINERALS) TABS Take 1 tablet by mouth daily.   . nitroGLYCERIN (NITROSTAT) 0.4 MG SL tablet DISSOLVE 1 TABLET UNDER TONGUE EVERY 5 MINUTES UP TO 15 MIN FOR CHESTPAIN. IF NO RELIEF CALL 911.  . potassium chloride SA (K-DUR,KLOR-CON) 20 MEQ tablet Take 1 tablet (20 mEq total) by mouth daily.  Marland Kitchen sulfamethoxazole-trimethoprim (BACTRIM DS,SEPTRA DS) 800-160 MG tablet TAKE ONE TABLET BY MOUTH EVERY MONDAY, WEDNESDAY, AND FRIDAY.  Marland Kitchen torsemide (DEMADEX) 20 MG tablet Take 1 tablet (20 mg total) by mouth daily. Take extra 1 tab for weight gain of 3 lbs in 1 day or 5 lbs in 2 days (Patient taking differently: Take 20 mg by mouth 2 (two) times daily. Take extra 1 tab for weight gain of 3 lbs in 1 day or 5 lbs in 2 days)  . Vitamin D, Ergocalciferol, (DRISDOL) 50000 units CAPS capsule TAKE 1 CAPSULE BY MOUTH EVERY  THIRTY DAYS.  Marland Kitchen XARELTO 20 MG TABS tablet TAKE 1 TABLET BY MOUTH DAILY.  Marland Kitchen sulfamethoxazole-trimethoprim (BACTRIM DS,SEPTRA DS) 800-160 MG tablet TAKE ONE TABLET BY MOUTH EVERY MONDAY, WEDNESDAY, AND FRIDAY. (Patient not taking: Reported on 09/30/2017)  . traMADol (ULTRAM) 50 MG tablet Take 1 tablet (50 mg total) by mouth every 6 (six) hours as needed. (Patient not taking: Reported on 09/30/2017)    FAMILY HISTORY:  His indicated that his mother is deceased. He indicated that his father is deceased. He indicated that the status of his neg hx is unknown. He indicated that the status of his unknown relative is unknown.   SOCIAL HISTORY: He  reports that he quit smoking about 16 years ago. His smoking use included cigarettes and cigars. He has a 0.25 pack-year smoking history. He has never used smokeless tobacco. He reports that he does not drink alcohol or use drugs.  REVIEW OF SYSTEMS:   Unable to assess as patient is encephalopathic   SUBJECTIVE:   VITAL SIGNS: BP (!) 111/58   Pulse 97   Temp 99.7 F (37.6 C) (Oral)   Resp (!) 30   Wt (!) 330 lb (149.7 kg)   SpO2 100%   BMI 42.37 kg/m   HEMODYNAMICS:    VENTILATOR SETTINGS:    INTAKE / OUTPUT: I/O last 3 completed shifts: In: 1050 [I.V.:1000; IV Piggyback:50] Out: -  PHYSICAL EXAMINATION: General:  Chronically on acutely ill morbidly obese male lying in bed in NAD HEENT: MM pink/dry, lips chapped, thick neck, pupils 3/ reactive Neuro:  Lethargic, oriented to self, follows simple commands, generalized weakness CV: rrr, no m/r/g PULM: even/non-labored, lungs bilaterally clear, diminished in bases GI: obese, soft, NT, heavy sediment noted in foley/ no hematuria  Extremities: warm/dry, no peripheral edema   Skin: stage 1 sacral pressure sore, sebaceous cyst below R flank, erythema in thigh and pannicular folds    LABS:  BMET Recent Labs  Lab 09/30/17 1732  NA 132*  K 4.6  CL 100*  CO2 18*  BUN 54*  CREATININE  3.48*  GLUCOSE 100*    Electrolytes Recent Labs  Lab 09/30/17 1732  CALCIUM 8.3*    CBC Recent Labs  Lab 09/30/17 1732  WBC 21.4*  HGB 10.6*  HCT 32.9*  PLT 104*    Coag's Recent Labs  Lab 09/30/17 1855  INR 2.86    Sepsis Markers Recent Labs  Lab 09/30/17 1750 09/30/17 1939  LATICACIDVEN 3.77* 3.41*    ABG No results for input(s): PHART, PCO2ART, PO2ART in the last 168 hours.  Liver Enzymes Recent Labs  Lab 09/30/17 1732  AST 94*  ALT 63  ALKPHOS 64  BILITOT 0.9  ALBUMIN 2.7*    Cardiac Enzymes No results for input(s): TROPONINI, PROBNP in the last 168 hours.  Glucose No results for input(s): GLUCAP in the last 168 hours.  Imaging  5/1 CT ABD >> 1. Uncomplicated cholelithiasis. 2. Nonobstructing left lower pole renal calculus. No hydroureteronephrosis. Stable right double pigtail ureteral stent in place with decompressed bladder containing a Foley catheter. 3. Patchy airspace opacities at each lung base, left greater than right compatible with atelectasis. Minimal superimposed pneumonia is not excluded given history of fever and sepsis. 4. Thoracolumbar spondylosis.  CXR 5/1 >> No acute findings.  Stable cardiomegaly and pulmonary vascular congestion. Stable left diaphragmatic hernia.  CULTURES: 5/1 BCx 2 >> 5/1 UC >>   ANTIBIOTICS: 5/1 vancomycin >> 5/1 zosyn >>   SIGNIFICANT EVENTS: 5/1 transfer from [facility] ED to [facility] admitted   LINES/TUBES: Port in Right chest  Chronic indwelling foley (last exchanged 4/30) R femoral CVL TL >>   DISCUSSION: 57 yo M presenting with fever and AMS one day after having chronic foley exchanged.  Transferred from [facility] ER for septic shock   ASSESSMENT / PLAN:  PULMONARY A: At risk for respiratory insufficiency with acute encephalopathy  Sleep apnea - mild pulm vascular congestion on CXR P:   Continue CPAP q HS Monitor for need of intubation Assess ABG to rule hypercarbia  Trend  CXR  CARDIOVASCULAR A:  Shock - septic  Hx systolic HF (EF 16-10% in 03/2016), Aflutter on Xarelto, HTN, HLD P:  Tele monitoring S/p 4.5 L of IVF, Continue levophed for map > 65 Trend lactate Assess cortisol then solu-cortef 50 mg q 6 Bridge to heparin gtt, hold xarelto Hold home torsemide   RENAL A:   AKI on CKD -- sCr 3.48 (up from 1.54 on 3/15) R ureteral stent - placed 3/1 Chronic foley  - no evidence of obstruction or hydronephrosis on CT - s/p 4.5L IVF P:   Hold further IVF given systolic HF and pulm vascular congestion on CXR  Trend BMP/ mag/ phos/ daily wts / urinary output Replace electrolytes as indicated Avoid nephrotoxic agents, ensure adequate renal perfusion  GASTROINTESTINAL A:   No acute issues  P:   NPO while AMS  HEMATOLOGIC A:   Anemia- chronic  Thrombocytopenia Elevated INR Coagulated on Xarelto Hx multiple myeloma- previously on maintenance with lenalidomide and dexamethasone - currently off due to recurrent severe infections P:  Trend CBC Bridge to heparin Trend INR  Monitor for bleeding  Type and screen  INFECTIOUS A:   Urosepsis  P:   Follow BC and UC Trend WBC/ fever curve Continue vanc and zosyn for now pending cultures, can likely soon deescalate to possibly ceftriaxone     ENDOCRINE A:   DM P:   CBG q 4 SSI  Assess cortisol to r/o adrenal insufficiency   NEUROLOGIC A:   Acute encephalopathy related infection Chronic debilitation  P:   Frequent neuro checks Hold sedation PT consult when appropriate   FAMILY  - Updates: Brother updated at bedside.  Wife, [name] [phone] or [phone]), updated by phone.    - Inter-disciplinary family meet or Palliative Care meeting due by:  5/9  CCT 55 mins  Kennieth Rad, AGACNP-BC [place] Pgr: [phone] or if no answer [phone] 10/01/2017, 3:02 AM

## 2017-10-01 NOTE — Progress Notes (Signed)
Pt has chronic foley from home, irrigated & changed the foley bag

## 2017-10-01 NOTE — Procedures (Signed)
Arterial Catheter Insertion Procedure Note JEFFRY VOGELSANG [id number] [birth year]  Procedure: Insertion of Arterial Catheter  Indications: Blood pressure monitoring and Frequent blood sampling  Procedure Details Consent: Risks of procedure as well as the alternatives and risks of each were explained to the (patient/caregiver).  Consent for procedure obtained. Time Out: Verified patient identification, verified procedure, site/side was marked, verified correct patient position, special equipment/implants available, medications/allergies/relevent history reviewed, required imaging and test results available.  Performed  Maximum sterile technique was used including antiseptics, cap, gloves, gown, hand hygiene, mask and sheet. Skin prep: Chlorhexidine; local anesthetic administered 20 gauge catheter was inserted into right radial artery using the Seldinger technique. ULTRASOUND GUIDANCE USED: NO Evaluation Blood flow good; BP tracing good. Complications: No apparent complications.   Virgilio Frees 10/01/2017

## 2017-10-01 NOTE — Consult Note (Signed)
Urology Consult  Referring physician: Dr. Alva Garnet Reason for referral: sepsis with indwelling right ureteral stent  Chief Complaint: fever  History of Present Illness: [name] is a 57yo with a hx of nephrolithiasis status post right ureteral stent placemen ton 3/1 for urosepsis from a right ureteral calculus who was admitted with gram negative sepsis likely from a urinary source. Since the ureteral stent was placed on 3/1 the patient had been doing well with rehab. Per his wife he had his indwelling foley changed on Monday and since then he had worsening lower urinary tract symptoms and confusion. On presentation to the ER he was minimally responsive and subsequently was intubated. He was sarted on vasopressors and broad spectrum antibiotics. CT scan obtained yesterday showed the right ureteral stent in good position with no hydronephrosis. He does have gas in the renal pelvis concerning for emphesematous pyelitis.   Past Medical History:  Diagnosis Date  . Anemia   . Arteriosclerotic cardiovascular disease (ASCVD)    MI-2000s; stent to the proximal LAD and diagonal in 2001; stress nuclear in 2008-impaired exercise capacity, left ventricular dilatation, moderately to severely depressed EF, apical, inferior and anteroseptal scar  . Arthritis   . Atrial flutter ([place])   . 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 Cellulitis of leg    both legs  . Chronic diarrhea   . Chronic kidney disease, stage 3, mod decreased GFR (HCC)    Creatinine of 1.84 in 06/2011 and 1.5 in 07/2011  . Diabetes mellitus    Insulin  . Dysrhythmia    AFlutter  . GERD (gastroesophageal reflux disease)   . Gout   . Hyperlipidemia   . Hypertension   . Injection site reaction   . Multiple myeloma 07/01/2011  . Myocardial infarction ([place]) 2000  . Obesity   . Pedal edema    Venous insufficiency  . Sleep apnea    uses cpap  . Ulcer    Past Surgical History:  Procedure Laterality Date  . ABSCESS DRAINAGE     Scrotal   . BIOPSY  01/02/2012   Procedure: BIOPSY;  Surgeon: Rogene Houston, MD;  [facility];  Service: Endoscopy;  Laterality: N/A;  . BONE MARROW BIOPSY  05/13/11  . CARDIAC CATHETERIZATION     cardiac stent  . CARDIOVERSION N/A 10/13/2012   Procedure: CARDIOVERSION;  Surgeon: Yehuda Savannah, MD;  Location: [facility];  Service: Cardiovascular;  Laterality: N/A;  . CATARACT EXTRACTION W/PHACO Left 02/13/2014   Procedure: CATARACT EXTRACTION PHACO AND INTRAOCULAR LENS PLACEMENT ([place]);  Surgeon: Tonny Branch, MD;  Location: [facility];  Service: Ophthalmology;  Laterality: Left;  CDE:  7.67  . CATARACT EXTRACTION W/PHACO Right 03/02/2014   Procedure: CATARACT EXTRACTION PHACO AND INTRAOCULAR LENS PLACEMENT RIGHT EYE CDE=16.81;  Surgeon: Tonny Branch, MD;  Location: [facility];  Service: Ophthalmology;  Laterality: Right;  . COLONOSCOPY  11/28/2011   Procedure: COLONOSCOPY;  Surgeon: Rogene Houston, MD;  [facility];  Service: Endoscopy;  Laterality: N/A;  930  . CYSTOSCOPY W/ URETERAL STENT PLACEMENT Right 07/31/2017   Procedure: CYSTOSCOPY WITH RETROGRADE PYELOGRAM/URETERAL STENT PLACEMENT;  Surgeon: Cleon Gustin, MD;  Location: [facility];  Service: Urology;  Laterality: Right;  . ESOPHAGOGASTRODUODENOSCOPY  01/02/2012   Procedure: ESOPHAGOGASTRODUODENOSCOPY (EGD);  Surgeon: Rogene Houston, MD;  [facility];  Service: Endoscopy;  Laterality: N/A;  100  . ESOPHAGOGASTRODUODENOSCOPY N/A 09/20/2012   Procedure: ESOPHAGOGASTRODUODENOSCOPY (EGD);  Surgeon: Rogene Houston, MD;  [facility];  Service: Endoscopy;  Laterality: N/A;  . EUS N/A 10/07/2012   Procedure: UPPER ENDOSCOPIC ULTRASOUND (EUS) LINEAR;  Surgeon: Milus Banister, MD;  [facility];  Service: Endoscopy;  Laterality: N/A;  . INCISION AND DRAINAGE ABSCESS N/A 04/14/2017   Procedure: INCISION AND DRAINAGE ABSCESS;  Surgeon: Ceasar Mons, MD;  [facility];  Service: Urology;   Laterality: N/A;  . INCISION AND DRAINAGE ABSCESS ANAL    . IRRIGATION AND DEBRIDEMENT ABSCESS N/A 04/15/2017   Procedure: DEBRIDEMENT SCROTAL WOUND AND DRESSING CHANGE;  Surgeon: Ceasar Mons, MD;  [facility];  Service: Urology;  Laterality: N/A;  . IRRIGATION AND DEBRIDEMENT ABSCESS N/A 04/17/2017   Procedure: IRRIGATION AND DEBRIDEMENT ABSCESS;  Surgeon: Ceasar Mons, MD;  [facility];  Service: Urology;  Laterality: N/A;  RM 3  . LAPAROSCOPIC GASTRIC BANDING  2006   has been removed  . OSTECTOMY Right 04/08/2017   Procedure: OSTECTOMY RIGHT GREAT TOE;  Surgeon: Caprice Beaver, DPM;  Location: [facility];  Service: Podiatry;  Laterality: Right;  . PORT-A-CATH REMOVAL Left 12/07/2012   Procedure: REMOVAL PORT-A-CATH;  Surgeon: Scherry Ran, MD;  Location: [facility];  Service: General;  Laterality: Left;  . PORTACATH PLACEMENT  07/07/2011   Procedure: INSERTION PORT-A-CATH;  Surgeon: Scherry Ran, MD;  Location: [facility];  Service: General;  Laterality: N/A;  . PORTACATH PLACEMENT N/A 12/07/2012   Procedure: INSERTION PORT-A-CATH;  Surgeon: Scherry Ran, MD;  Location: [facility];  Service: General;  Laterality: N/A;  Attempted portacath placement on left and right side  . WOUND DEBRIDEMENT Right 04/08/2017   Procedure: EXCISION ULCERATION RIGHT GREAT TOE;  Surgeon: Caprice Beaver, DPM;  Location: [facility];  Service: Podiatry;  Laterality: Right;  . WRIST SURGERY     Left; removal of bone fragment    Medications: I have reviewed the patient's current medications. Allergies: No Known Allergies  Family History  Problem Relation Age of Onset  . Heart disease Mother   . Cancer Mother   . Diabetes Father   . Arthritis Unknown   . Anesthesia problems Neg Hx   . Hypotension Neg Hx   . Malignant hyperthermia Neg Hx   . Pseudochol deficiency Neg Hx    Social History:  reports that he quit smoking about 16 years ago. His smoking use included  cigarettes and cigars. He has a 0.25 pack-year smoking history. He has never used smokeless tobacco. He reports that he does not drink alcohol or use drugs.  Review of Systems  Unable to perform ROS: Intubated    Physical Exam:  Vital signs in last 24 hours: Temp:  [98.7 F (37.1 C)-101.7 F (38.7 C)] 98.7 F (37.1 C) (05/02 1539) Pulse Rate:  [61-101] 87 (05/02 1800) Resp:  [14-37] 14 (05/02 1800) BP: (61-132)/(37-84) 73/52 (05/02 0400) SpO2:  [76 %-100 %] 100 % (05/02 1800) Arterial Line BP: (83-161)/(48-81) 143/75 (05/02 1800) FiO2 (%):  [50 %] 50 % (05/02 1645) Weight:  [159.5 kg (351 lb 10.1 oz)] 159.5 kg (351 lb 10.1 oz) (05/02 0305) Physical Exam  Constitutional: He appears well-developed and well-nourished.  HENT:  Head: Normocephalic and atraumatic.  Eyes: Conjunctivae are normal. Right eye exhibits no discharge. Left eye exhibits no discharge.  Neck: Neck supple. No thyromegaly present.  Cardiovascular: Normal rate and regular rhythm.  Respiratory: No respiratory distress. He has no wheezes.  GI: Soft. He exhibits no distension.  Musculoskeletal: Normal range of motion. He exhibits edema.  Skin: Skin is warm. No rash noted. No erythema.    Laboratory Data:  Results for orders placed or performed during the hospital encounter of 09/30/17 (from the past 72 hour(s))  Comprehensive metabolic panel     Status: Abnormal   Collection Time: 09/30/17  5:32 PM  Result Value Ref Range   Sodium 132 (L) 135 - 145 mmol/L   Potassium 4.6 3.5 - 5.1 mmol/L   Chloride 100 (L) 101 - 111 mmol/L   CO2 18 (L) 22 - 32 mmol/L   Glucose, Bld 100 (H) 65 - 99 mg/dL   BUN 54 (H) 6 - 20 mg/dL   Creatinine, Ser 3.48 (H) 0.61 - 1.24 mg/dL   Calcium 8.3 (L) 8.9 - 10.3 mg/dL   Total Protein 6.1 (L) 6.5 - 8.1 g/dL   Albumin 2.7 (L) 3.5 - 5.0 g/dL   AST 94 (H) 15 - 41 U/L   ALT 63 17 - 63 U/L   Alkaline Phosphatase 64 38 - 126 U/L   Total Bilirubin 0.9 0.3 - 1.2 mg/dL   GFR calc non Af Amer  18 (L) >60 mL/min   GFR calc Af Amer 21 (L) >60 mL/min    Comment: (NOTE) The eGFR has been calculated using the CKD EPI equation. This calculation has not been validated in all clinical situations. eGFR's persistently <60 mL/min signify possible Chronic Kidney Disease.    Anion gap 14 5 - 15    Comment: Performed at [facility], [address] [place], [place] [address]  CBC with Differential     Status: Abnormal   Collection Time: 09/30/17  5:32 PM  Result Value Ref Range   WBC 21.4 (H) 4.0 - 10.5 K/uL   RBC 3.51 (L) 4.22 - 5.81 MIL/uL   Hemoglobin 10.6 (L) 13.0 - 17.0 g/dL   HCT 32.9 (L) 39.0 - 52.0 %   MCV 93.7 78.0 - 100.0 fL   MCH 30.2 26.0 - 34.0 pg   MCHC 32.2 30.0 - 36.0 g/dL   RDW 17.5 (H) 11.5 - 15.5 %   Platelets 104 (L) 150 - 400 K/uL    Comment: SPECIMEN CHECKED FOR CLOTS PLATELET COUNT CONFIRMED BY SMEAR    Neutrophils Relative % 90 %   Neutro Abs 19.2 1.7 - 7.7 K/uL   Lymphocytes Relative 4 %   Lymphs Abs 0.9 0.7 - 4.0 K/uL   Monocytes Relative 6 %   Monocytes Absolute 1.3 (H) 0.1 - 1.0 K/uL   Eosinophils Relative 0 %   Eosinophils Absolute 0.0 0.0 - 0.7 K/uL   Basophils Relative 0 %   Basophils Absolute 0.0 0.0 - 0.1 K/uL   WBC Morphology WHITE COUNT CONFIRMED ON SMEAR     Comment: Performed at [facility], [address] [place], [place] [address]  Culture, blood (Routine x 2)     Status: None (Preliminary result)   Collection Time: 09/30/17  5:32 PM  Result Value Ref Range   Specimen Description      RIGHT ANTECUBITAL Performed at [facility], [address], [place], [place] [address]    Special Requests      BOTTLES DRAWN AEROBIC AND ANAEROBIC Blood Culture adequate volume Performed at [facility], [address], [place], [place] [address]    Culture  Setup Time      GRAM NEGATIVE RODS ANAEROBIC BOTTLE Gram Stain Report Called to,Read Back By and Verified With: FIELDS S. AT [place] AT 0920A ON [id number] BY THOMPSON S. GRAM POSITIVE COCCI IN  CLUSTERS  RECOVERED FROM THE AEROBIC BOTTLE Gram Stain Report Called to,Read Back By and Verified With: JOHNSON,J. AT 2202 ON 10/01/2017 BY BAUGHAM,M. Performed at [facility].  VALUE IS CONSISTENT WITH PREVIOUSLY REPORTED AND CALLED VALUE. Performed at [facility], [place] [address], [place], [place] [address]    Culture PENDING    Report Status PENDING   Urinalysis, Routine w reflex microscopic     Status: Abnormal   Collection Time: 09/30/17  5:32 PM  Result Value Ref Range   Color, Urine YELLOW YELLOW   APPearance TURBID (A) CLEAR   Specific Gravity, Urine 1.017 1.005 - 1.030   pH 7.0 5.0 - 8.0   Glucose, UA NEGATIVE NEGATIVE mg/dL   Hgb urine dipstick LARGE (A) NEGATIVE   Bilirubin Urine NEGATIVE NEGATIVE   Ketones, ur NEGATIVE NEGATIVE mg/dL   Protein, ur 100 (A) NEGATIVE mg/dL   Nitrite NEGATIVE NEGATIVE   Leukocytes, UA MODERATE (A) NEGATIVE   RBC / HPF 21-50 0 - 5 RBC/hpf   WBC, UA 21-50 0 - 5 WBC/hpf   Bacteria, UA RARE (A) NONE SEEN   Squamous Epithelial / LPF 0-5 0 - 5    Comment: Please note change in reference range. Performed at [facility], [address], [place], [place] [address]   Culture, blood (Routine x 2)     Status: None (Preliminary result)   Collection Time: 09/30/17  5:37 PM  Result Value Ref Range   Specimen Description      RIGHT ANTECUBITAL Performed at [place] [place] [place], [place] [address]    Special Requests      BOTTLES DRAWN AEROBIC AND ANAEROBIC Blood Culture adequate volume Performed at [place] [place], [place] [address]    Culture  Setup Time      GRAM NEGATIVE RODS ANEROBIC BOTTLE Gram Stain Report Called to,Read Back By and Verified With: FIELDS S. AT [facility] AT 0920A ON [id number] BY THOMPSON S. GRAM POSITIVE COCCI IN CLUSTERS REOVERED FROM THE AEROBIC BOTTLE. Gram Stain Report Called to,Read Back By and Verified With: JOHNSON,J. AT 1258 ON 10/01/2017 BY BAUGHAM,M. Performed at  [facility] Organism ID to follow ANAEROBIC BOTTLE ONLY Performed at [facility], [place] [address], [place],  [address]    Culture PENDING    Report Status PENDING   Blood Culture ID Panel (Reflexed)     Status: Abnormal   Collection Time: 09/30/17  5:37 PM  Result Value Ref Range   Enterococcus species NOT DETECTED NOT DETECTED   Listeria monocytogenes NOT DETECTED NOT DETECTED   Staphylococcus species DETECTED (A) NOT DETECTED    Comment: CRITICAL RESULT CALLED TO, READ BACK BY AND VERIFIED WITH: Asencion Islam PharmD 16:05 10/01/17 ([place]m)    Staphylococcus aureus DETECTED (A) NOT DETECTED    Comment: Methicillin (oxacillin)-resistant Staphylococcus aureus (MRSA). MRSA is predictably resistant to beta-lactam antibiotics (except ceftaroline). Preferred therapy is vancomycin unless clinically contraindicated. Patient requires contact precautions if  hospitalized. CRITICAL RESULT CALLED TO, READ BACK BY AND VERIFIED WITH: Asencion Islam PharmD 16:05 10/01/17 ([place]sonm)    Methicillin resistance DETECTED (A) NOT DETECTED    Comment: CRITICAL RESULT CALLED TO, READ BACK BY AND VERIFIED WITH: Asencion Islam PharmD 16:05 10/01/17 ([place]m)    Streptococcus species NOT DETECTED NOT DETECTED   Streptococcus agalactiae NOT DETECTED NOT DETECTED   Streptococcus pneumoniae NOT DETECTED NOT DETECTED   Streptococcus pyogenes NOT DETECTED NOT DETECTED   Acinetobacter  baumannii NOT DETECTED NOT DETECTED   Enterobacteriaceae species DETECTED (A) NOT DETECTED    Comment: Enterobacteriaceae represent a large family of gram-negative bacteria, not a single organism. CRITICAL RESULT CALLED TO, READ BACK BY AND VERIFIED WITH: Jacinto Reap Mancheril PharmD 16:05 10/01/17 ([place]m)    Enterobacter cloacae complex NOT DETECTED NOT DETECTED   Escherichia coli DETECTED (A) NOT DETECTED    Comment: CRITICAL RESULT CALLED TO, READ BACK BY AND VERIFIED WITH: B. Mancheril PharmD 16:05 10/01/17 ([place]m)     Klebsiella oxytoca NOT DETECTED NOT DETECTED   Klebsiella pneumoniae NOT DETECTED NOT DETECTED   Proteus species NOT DETECTED NOT DETECTED   Serratia marcescens NOT DETECTED NOT DETECTED   Carbapenem resistance NOT DETECTED NOT DETECTED   Haemophilus influenzae NOT DETECTED NOT DETECTED   Neisseria meningitidis NOT DETECTED NOT DETECTED   Pseudomonas aeruginosa NOT DETECTED NOT DETECTED   Candida albicans NOT DETECTED NOT DETECTED   Candida glabrata NOT DETECTED NOT DETECTED   Candida krusei NOT DETECTED NOT DETECTED   Candida parapsilosis NOT DETECTED NOT DETECTED   Candida tropicalis NOT DETECTED NOT DETECTED    Comment: Performed at [facility], [place] [address], [place], [place] [address]  I-Stat CG4 Lactic Acid, ED     Status: Abnormal   Collection Time: 09/30/17  5:50 PM  Result Value Ref Range   Lactic Acid, Venous 3.77 (HH) 0.5 - 1.9 mmol/L   Comment NOTIFIED PHYSICIAN   Protime-INR     Status: Abnormal   Collection Time: 09/30/17  6:55 PM  Result Value Ref Range   Prothrombin Time 29.8 (H) 11.4 - 15.2 seconds   INR 2.86     Comment: Performed at [facility], [address], [place], [place] [address]  I-Stat CG4 Lactic Acid, ED     Status: Abnormal   Collection Time: 09/30/17  7:39 PM  Result Value Ref Range   Lactic Acid, Venous 3.41 (HH) 0.5 - 1.9 mmol/L  Glucose, capillary     Status: Abnormal   Collection Time: 10/01/17  1:39 AM  Result Value Ref Range   Glucose-Capillary 107 (H) 65 - 99 mg/dL  MRSA PCR Screening     Status: Abnormal   Collection Time: 10/01/17  2:26 AM  Result Value Ref Range   MRSA by PCR POSITIVE (A) NEGATIVE    Comment:        The GeneXpert MRSA Assay (FDA approved for NASAL specimens only), is one component of a comprehensive MRSA colonization surveillance program. It is not intended to diagnose MRSA infection nor to guide or monitor treatment for MRSA infections. RESULT CALLED TO, READ BACK BY AND VERIFIED WITH: B SHEPARD RN  10/01/17 0438 JDW   I-STAT 3, arterial blood gas (G3+)     Status: Abnormal   Collection Time: 10/01/17  2:42 AM  Result Value Ref Range   pH, Arterial 7.333 (L) 7.350 - 7.450   pCO2 arterial 30.1 (L) 32.0 - 48.0 mmHg   pO2, Arterial 113.0 (H) 83.0 - 108.0 mmHg   Bicarbonate 16.0 (L) 20.0 - 28.0 mmol/L   TCO2 17 (L) 22 - 32 mmol/L   O2 Saturation 98.0 %   Acid-base deficit 9.0 (H) 0.0 - 2.0 mmol/L   Patient temperature 98.6 F    Collection site RADIAL, ALLEN'S TEST ACCEPTABLE    Drawn by RT    Sample type ARTERIAL   Renal function panel     Status: Abnormal   Collection Time: 10/01/17  2:57 AM  Result Value  Ref Range   Sodium 134 (L) 135 - 145 mmol/L   Potassium 4.9 3.5 - 5.1 mmol/L   Chloride 106 101 - 111 mmol/L   CO2 18 (L) 22 - 32 mmol/L   Glucose, Bld 126 (H) 65 - 99 mg/dL   BUN 50 (H) 6 - 20 mg/dL   Creatinine, Ser 3.44 (H) 0.61 - 1.24 mg/dL   Calcium 7.6 (L) 8.9 - 10.3 mg/dL   Phosphorus 3.3 2.5 - 4.6 mg/dL   Albumin 2.5 (L) 3.5 - 5.0 g/dL   GFR calc non Af Amer 18 (L) >60 mL/min   GFR calc Af Amer 21 (L) >60 mL/min    Comment: (NOTE) The eGFR has been calculated using the CKD EPI equation. This calculation has not been validated in all clinical situations. eGFR's persistently <60 mL/min signify possible Chronic Kidney Disease.    Anion gap 10 5 - 15    Comment: Performed at [facility] [address], [place], [place] [address]  Magnesium     Status: Abnormal   Collection Time: 10/01/17  2:57 AM  Result Value Ref Range   Magnesium 1.4 (L) 1.7 - 2.4 mg/dL    Comment: Performed at [place] [facility] [address] [place], [place] [address]  Phosphorus     Status: None   Collection Time: 10/01/17  2:57 AM  Result Value Ref Range   Phosphorus 3.2 2.5 - 4.6 mg/dL    Comment: Performed at [facility] [address] [place], [place] [address]  Cortisol     Status: None   Collection Time: 10/01/17  2:57 AM  Result Value Ref Range   Cortisol,  Plasma 17.6 ug/dL    Comment: (NOTE) AM    6.7 - 22.6 ug/dL PM   <10.0       ug/dL Performed at [facility] [address], [place], [place] [address]   CBC     Status: Abnormal   Collection Time: 10/01/17  2:57 AM  Result Value Ref Range   WBC 27.8 (H) 4.0 - 10.5 K/uL   RBC 3.34 (L) 4.22 - 5.81 MIL/uL   Hemoglobin 10.1 (L) 13.0 - 17.0 g/dL   HCT 31.0 (L) 39.0 - 52.0 %   MCV 92.8 78.0 - 100.0 fL   MCH 30.2 26.0 - 34.0 pg   MCHC 32.6 30.0 - 36.0 g/dL   RDW 17.6 (H) 11.5 - 15.5 %   Platelets 113 (L) 150 - 400 K/uL    Comment: REPEATED TO VERIFY SPECIMEN CHECKED FOR CLOTS PLATELET COUNT CONFIRMED BY SMEAR Performed at [facility] [address], [place], [place] [address]   Protime-INR     Status: Abnormal   Collection Time: 10/01/17  2:57 AM  Result Value Ref Range   Prothrombin Time 29.2 (H) 11.4 - 15.2 seconds   INR 2.79     Comment: Performed at [facility] [address], [place], [place] [address]  Procalcitonin - Baseline     Status: None   Collection Time: 10/01/17  2:57 AM  Result Value Ref Range   Procalcitonin >150.00 ng/mL    Comment:        Interpretation: PCT >= 10 ng/mL: Important systemic inflammatory response, almost exclusively due to severe bacterial sepsis or septic shock. (NOTE)       Sepsis PCT Algorithm           Lower Respiratory Tract  Infection PCT Algorithm    ----------------------------     ----------------------------         PCT < 0.25 ng/mL                PCT < 0.10 ng/mL         Strongly encourage             Strongly discourage   discontinuation of antibiotics    initiation of antibiotics    ----------------------------     -----------------------------       PCT 0.25 - 0.50 ng/mL            PCT 0.10 - 0.25 ng/mL               OR       >80% decrease in PCT            Discourage initiation of                                            antibiotics      Encourage  discontinuation           of antibiotics    ----------------------------     -----------------------------         PCT >= 0.50 ng/mL              PCT 0.26 - 0.50 ng/mL                AND       <80% decrease in PCT             Encourage initiation of                                             antibiotics       Encourage continuation           of antibiotics    ----------------------------     -----------------------------        PCT >= 0.50 ng/mL                  PCT > 0.50 ng/mL               AND         increase in PCT                  Strongly encourage                                      initiation of antibiotics    Strongly encourage escalation           of antibiotics                                     -----------------------------                                           PCT <= 0.25 ng/mL  OR                                        > 80% decrease in PCT                                     Discontinue / Do not initiate                                             antibiotics Performed at [facility], [place] [address], [place], [place] [address]   Lactic acid, plasma     Status: Abnormal   Collection Time: 10/01/17  3:00 AM  Result Value Ref Range   Lactic Acid, Venous 2.6 (HH) 0.5 - 1.9 mmol/L    Comment: CRITICAL RESULT CALLED TO, READ BACK BY AND VERIFIED WITH: SHEPHERD,B RN 10/01/2017 0351 JORDANS Performed at [facility], [place] [address] [place], [place] [address]   Glucose, capillary     Status: Abnormal   Collection Time: 10/01/17  3:38 AM  Result Value Ref Range   Glucose-Capillary 123 (H) 65 - 99 mg/dL  Type and screen     Status: None   Collection Time: 10/01/17  3:45 AM  Result Value Ref Range   ABO/RH(D) O POS    Antibody Screen NEG    Sample Expiration      10/04/2017 Performed at [facility], [place] [address] [place], [place] [address]   ABO/Rh     Status: None   Collection Time:  10/01/17  3:45 AM  Result Value Ref Range   ABO/RH(D)      O POS Performed at [facility] [address] [place], [place] [address]   Blood gas, arterial     Status: Abnormal   Collection Time: 10/01/17  4:42 AM  Result Value Ref Range   O2 Content 5.0 L/min   Delivery systems CONTINUOUS POSITIVE AIRWAY PRESSURE    Expiratory PAP 6    pH, Arterial 7.355 7.350 - 7.450   pCO2 arterial 30.5 (L) 32.0 - 48.0 mmHg   pO2, Arterial 184 (H) 83.0 - 108.0 mmHg   Bicarbonate 16.6 (L) 20.0 - 28.0 mmol/L   Acid-base deficit 7.9 (H) 0.0 - 2.0 mmol/L   O2 Saturation 99.0 %   Patient temperature 98.6    Collection site A-LINE    Drawn by [id number]    Sample type ARTERIAL DRAW    Allens test (pass/fail) PASS PASS  Glucose, capillary     Status: Abnormal   Collection Time: 10/01/17  7:40 AM  Result Value Ref Range   Glucose-Capillary 122 (H) 65 - 99 mg/dL  Glucose, capillary     Status: Abnormal   Collection Time: 10/01/17 11:45 AM  Result Value Ref Range   Glucose-Capillary 139 (H) 65 - 99 mg/dL  Lactic acid, plasma     Status: None   Collection Time: 10/01/17 12:19 PM  Result Value Ref Range   Lactic Acid, Venous 1.9 0.5 - 1.9 mmol/L    Comment: Performed at [facility], [place] [address], [place], [place] [address]  Troponin I     Status: Abnormal   Collection Time:  10/01/17 12:19 PM  Result Value Ref Range   Troponin I 0.16 (HH) <0.03 ng/mL    Comment: CRITICAL RESULT CALLED TO, READ BACK BY AND VERIFIED WITH: I.BAYNES,RN 1403 10/01/17 CLARK,S Performed at [facility], [place] [address], [place], [place] [address]   APTT     Status: Abnormal   Collection Time: 10/01/17 12:19 PM  Result Value Ref Range   aPTT 70 (H) 24 - 36 seconds    Comment:        IF BASELINE aPTT IS ELEVATED, SUGGEST PATIENT RISK ASSESSMENT BE USED TO DETERMINE APPROPRIATE ANTICOAGULANT THERAPY. Performed at [facility], [place] [address], [place], [place] [address]   Heparin level  (unfractionated)     Status: Abnormal   Collection Time: 10/01/17 12:19 PM  Result Value Ref Range   Heparin Unfractionated >2.20 (H) 0.30 - 0.70 IU/mL    Comment: RESULTS CONFIRMED BY MANUAL DILUTION IF HEPARIN RESULTS ARE BELOW EXPECTED VALUES, AND PATIENT DOSAGE HAS BEEN CONFIRMED, SUGGEST FOLLOW UP TESTING OF ANTITHROMBIN III LEVELS. Performed at [facility], [place] [address], [place], [place] [address]   Glucose, capillary     Status: Abnormal   Collection Time: 10/01/17  3:36 PM  Result Value Ref Range   Glucose-Capillary 141 (H) 65 - 99 mg/dL  I-STAT 3, arterial blood gas (G3+)     Status: Abnormal   Collection Time: 10/01/17  6:17 PM  Result Value Ref Range   pH, Arterial 7.223 (L) 7.350 - 7.450   pCO2 arterial 42.9 32.0 - 48.0 mmHg   pO2, Arterial 131.0 (H) 83.0 - 108.0 mmHg   Bicarbonate 17.7 (L) 20.0 - 28.0 mmol/L   TCO2 19 (L) 22 - 32 mmol/L   O2 Saturation 98.0 %   Acid-base deficit 9.0 (H) 0.0 - 2.0 mmol/L   Patient temperature 98.6 F    Collection site ARTERIAL LINE    Drawn by Operator    Sample type ARTERIAL    *Note: Due to a large number of results and/or encounters for the requested time period, some results have not been displayed. A complete set of results can be found in Results Review.   Recent Results (from the past 240 hour(s))  Culture, blood (Routine x 2)     Status: None (Preliminary result)   Collection Time: 09/30/17  5:32 PM  Result Value Ref Range Status   Specimen Description   Final    RIGHT ANTECUBITAL Performed at [facility], [address] [place], [place] [address]    Special Requests   Final    BOTTLES DRAWN AEROBIC AND ANAEROBIC Blood Culture adequate volume Performed at [facility], [address], [place], [place] [address]    Culture  Setup Time   Final    GRAM NEGATIVE RODS ANAEROBIC BOTTLE Gram Stain Report Called to,Read Back By and Verified With: FIELDS S. AT [place] AT 0920A ON [id number] BY THOMPSON S. GRAM POSITIVE COCCI  IN CLUSTERS RECOVERED FROM THE AEROBIC BOTTLE Gram Stain Report Called to,Read Back By and Verified With: JOHNSON,J. AT 7106 ON 10/01/2017 BY BAUGHAM,M. Performed at [place].  VALUE IS CONSISTENT WITH PREVIOUSLY REPORTED AND CALLED VALUE. Performed at [facility], [place] [address], [place], [place] [address]    Culture PENDING  Incomplete   Report Status PENDING  Incomplete  Culture, blood (Routine x 2)     Status: None (Preliminary result)   Collection Time: 09/30/17  5:37 PM  Result Value Ref Range Status   Specimen Description   Final    RIGHT ANTECUBITAL Performed at [facility], [address] [place], [place] [address]    Special Requests   Final    BOTTLES DRAWN AEROBIC AND ANAEROBIC Blood Culture adequate volume Performed at [facility], [address] [place], [place] [address]    Culture  Setup Time   Final    GRAM NEGATIVE RODS ANEROBIC BOTTLE Gram Stain Report Called to,Read Back By and Verified With: FIELDS S. AT [place] AT 0920A ON [id number] BY THOMPSON S. GRAM POSITIVE COCCI IN CLUSTERS REOVERED FROM THE AEROBIC BOTTLE. Gram Stain Report Called to,Read Back By and Verified With: JOHNSON,J. AT 1258 ON 10/01/2017 BY BAUGHAM,M. Performed at [facility] Organism ID to follow ANAEROBIC BOTTLE ONLY Performed at [facility], [place] [address] [place], [place] [address]    Culture PENDING  Incomplete   Report Status PENDING  Incomplete  Blood Culture ID Panel (Reflexed)     Status: Abnormal   Collection Time: 09/30/17  5:37 PM  Result Value Ref Range Status   Enterococcus species NOT DETECTED NOT DETECTED Final   Listeria monocytogenes NOT DETECTED NOT DETECTED Final   Staphylococcus species DETECTED (A) NOT DETECTED Final    Comment: CRITICAL RESULT CALLED TO, READ BACK BY AND VERIFIED WITH: Asencion Islam PharmD 16:05 10/01/17 ([email or web address])    Staphylococcus aureus DETECTED (A) NOT DETECTED Final    Comment: Methicillin  (oxacillin)-resistant Staphylococcus aureus (MRSA). MRSA is predictably resistant to beta-lactam antibiotics (except ceftaroline). Preferred therapy is vancomycin unless clinically contraindicated. Patient requires contact precautions if  hospitalized. CRITICAL RESULT CALLED TO, READ BACK BY AND VERIFIED WITH: Asencion Islam PharmD 16:05 10/01/17 (wilsonm)    Methicillin resistance DETECTED (A) NOT DETECTED Final    Comment: CRITICAL RESULT CALLED TO, READ BACK BY AND VERIFIED WITH: Asencion Islam PharmD 16:05 10/01/17 (wilsonm)    Streptococcus species NOT DETECTED NOT DETECTED Final   Streptococcus agalactiae NOT DETECTED NOT DETECTED Final   Streptococcus pneumoniae NOT DETECTED NOT DETECTED Final   Streptococcus pyogenes NOT DETECTED NOT DETECTED Final   Acinetobacter baumannii NOT DETECTED NOT DETECTED Final   Enterobacteriaceae species DETECTED (A) NOT DETECTED Final    Comment: Enterobacteriaceae represent a large family of gram-negative bacteria, not a single organism. CRITICAL RESULT CALLED TO, READ BACK BY AND VERIFIED WITH: Jacinto Reap Mancheril PharmD 16:05 10/01/17 (wilsonm)    Enterobacter cloacae complex NOT DETECTED NOT DETECTED Final   Escherichia coli DETECTED (A) NOT DETECTED Final    Comment: CRITICAL RESULT CALLED TO, READ BACK BY AND VERIFIED WITH: Asencion Islam PharmD 16:05 10/01/17 (wilsonm)    Klebsiella oxytoca NOT DETECTED NOT DETECTED Final   Klebsiella pneumoniae NOT DETECTED NOT DETECTED Final   Proteus species NOT DETECTED NOT DETECTED Final   Serratia marcescens NOT DETECTED NOT DETECTED Final   Carbapenem resistance NOT DETECTED NOT DETECTED Final   Haemophilus influenzae NOT DETECTED NOT DETECTED Final   Neisseria meningitidis NOT DETECTED NOT DETECTED Final   Pseudomonas aeruginosa NOT DETECTED NOT DETECTED Final   Candida albicans NOT DETECTED NOT DETECTED Final   Candida glabrata NOT DETECTED NOT DETECTED Final   Candida krusei NOT DETECTED NOT DETECTED Final    Candida parapsilosis NOT DETECTED NOT DETECTED Final   Candida tropicalis NOT DETECTED NOT DETECTED Final    Comment: Performed at [facility], [place] [address], [place], [place] [address]  MRSA PCR Screening     Status: Abnormal  Collection Time: 10/01/17  2:26 AM  Result Value Ref Range Status   MRSA by PCR POSITIVE (A) NEGATIVE Final    Comment:        The GeneXpert MRSA Assay (FDA approved for NASAL specimens only), is one component of a comprehensive MRSA colonization surveillance program. It is not intended to diagnose MRSA infection nor to guide or monitor treatment for MRSA infections. RESULT CALLED TO, READ BACK BY AND VERIFIED WITH: B SHEPARD RN 10/01/17 0438 JDW    Creatinine: Recent Labs    09/30/17 1732 10/01/17 0257  CREATININE 3.48* 3.44*   Baseline Creatinine: 1.5  Impression/Assessment:  56yo with sepsis from a urinary source, emphesematous pyelitis with right ureteral stent in place  Plan:  1. I discussed with the patients wife the management of emphesematous pyelitis which included drainage of the collecting system and broad spectrum antibiotics. The right ureteral stent is draining well without hydronephrosis so I should remain in place. I do not recommend changing the stent at this time. I discussed the need for the patient to keep his followup appointment to have the ureteral stone and stent removed. We will schedule him for followup 2 weeks after discharge for ureteroscopic stone extraction and stent removal. Please continue broad spectrum antibiotics.   Nicolette Bang 10/01/2017, 7:22 PM

## 2017-10-01 NOTE — Progress Notes (Signed)
Received call from [place] stating that patient has positive blood cultures, gram negative rods [name], NP made aware

## 2017-10-01 NOTE — Progress Notes (Signed)
CRITICAL VALUE ALERT  Critical Value: Lactic   Date & Time Notied: 10/01/2017 04:10   Provider Notified: Warren Lacy MD  Orders Received/Actions taken: continue to monitor levels

## 2017-10-01 NOTE — Consult Note (Signed)
[facility] Surgery Consult/Admission Note  [name] 31-Oct-1960  [id number].    Requesting MD: Lake Bells Chief Complaint/Reason for Consult: Port removal  HPI:  57 yo male with extensive PMH including multiple myeloma on chronic steroids, HTN, systolic heart failure, aflutter on Xarelto, CKD, DM presents with pyelonephritis as well as positive blood cultures for E coli and MRSA.  We are asked to remove port.  Patient is on pressors and is in the process of being intubated.  On Heparin gtt for aflutter.  ROS: ROS - unable to obtain  Family History  Problem Relation Age of Onset  . Heart disease Mother   . Cancer Mother   . Diabetes Father   . Arthritis Unknown   . Anesthesia problems Neg Hx   . Hypotension Neg Hx   . Malignant hyperthermia Neg Hx   . Pseudochol deficiency Neg Hx     Past Medical History:  Diagnosis Date  . Anemia   . Arteriosclerotic cardiovascular disease (ASCVD)    MI-2000s; stent to the proximal LAD and diagonal in 2001; stress nuclear in 2008-impaired exercise capacity, left ventricular dilatation, moderately to severely depressed EF, apical, inferior and anteroseptal scar  . Arthritis   . Atrial flutter ([place])   . 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 Cellulitis of leg    both legs  . Chronic diarrhea   . Chronic kidney disease, stage 3, mod decreased GFR (HCC)    Creatinine of 1.84 in 06/2011 and 1.5 in 07/2011  . Diabetes mellitus    Insulin  . Dysrhythmia    AFlutter  . GERD (gastroesophageal reflux disease)   . Gout   . Hyperlipidemia   . Hypertension   . Injection site reaction   . Multiple myeloma 07/01/2011  . Myocardial infarction ([place]) 2000  . Obesity   . Pedal edema    Venous insufficiency  . Sleep apnea    uses cpap  . Ulcer     Past Surgical History:  Procedure Laterality Date  . ABSCESS DRAINAGE     Scrotal  . BIOPSY  01/02/2012   Procedure: BIOPSY;  Surgeon: Rogene Houston, MD;  [facility];  Service:  Endoscopy;  Laterality: N/A;  . BONE MARROW BIOPSY  05/13/11  . CARDIAC CATHETERIZATION     cardiac stent  . CARDIOVERSION N/A 10/13/2012   Procedure: CARDIOVERSION;  Surgeon: Yehuda Savannah, MD;  Location: [facility] ORS;  Service: Cardiovascular;  Laterality: N/A;  . CATARACT EXTRACTION W/PHACO Left 02/13/2014   Procedure: CATARACT EXTRACTION PHACO AND INTRAOCULAR LENS PLACEMENT ([place]);  Surgeon: Tonny Branch, MD;  Location: [facility] ORS;  Service: Ophthalmology;  Laterality: Left;  CDE:  7.67  . CATARACT EXTRACTION W/PHACO Right 03/02/2014   Procedure: CATARACT EXTRACTION PHACO AND INTRAOCULAR LENS PLACEMENT RIGHT EYE CDE=16.81;  Surgeon: Tonny Branch, MD;  Location: [facility] ORS;  Service: Ophthalmology;  Laterality: Right;  . COLONOSCOPY  11/28/2011   Procedure: COLONOSCOPY;  Surgeon: Rogene Houston, MD;  Location: [facility] END[facility] SU[facility];  Service: Endoscopy;  Laterality: N/A;  930  . CYSTOSCOPY W/ URETERAL STENT PLACEMENT Right 07/31/2017   Procedure: CYSTOSCOPY WITH RETROGRADE PYELOGRAM/URETERAL STENT PLACEMENT;  Surgeon: Cleon Gustin, MD;  Location: [facility] ORS;  Service: Urology;  Laterality: Right;  . ESOPHAGOGASTRODUODENOSCOPY  01/02/2012   Procedure: ESOPHAGOGASTRODUODENOSCOPY (EGD);  Surgeon: Rogene Houston, MD;  Location[facility];  Service: Endoscopy;  Laterality: N/A;  100  . ESOPHAGOGASTRODUODENOSCOPY N/A 09/20/2012   Procedure: ESOPHAGOGASTRODUODENOSCOPY (EGD);  Surgeon: Rogene Houston, MD;  [facility];  Service: Endoscopy;  Laterality: N/A;  . EUS N/A 10/07/2012   Procedure: UPPER ENDOSCOPIC ULTRASOUND (EUS) LINEAR;  Surgeon: Milus Banister, MD;  Location: [facility] ENDOSCOPY;  Service: Endoscopy;  Laterality: N/A;  . INCISION AND DRAINAGE ABSCESS N/A 04/14/2017   Procedure: INCISION AND DRAINAGE ABSCESS;  Surgeon: Ceasar Mons, MD;  Location: [facility] ORS;  Service: Urology;  Laterality: N/A;  . INCISION AND DRAINAGE ABSCESS ANAL    . IRRIGATION AND DEBRIDEMENT ABSCESS N/A 04/15/2017    Procedure: DEBRIDEMENT SCROTAL WOUND AND DRESSING CHANGE;  Surgeon: Ceasar Mons, MD;  Location: [facility];  Service: Urology;  Laterality: N/A;  . IRRIGATION AND DEBRIDEMENT ABSCESS N/A 04/17/2017   Procedure: IRRIGATION AND DEBRIDEMENT ABSCESS;  Surgeon: Ceasar Mons, MD;  Location: [facility];  Service: Urology;  Laterality: N/A;  RM 3  . LAPAROSCOPIC GASTRIC BANDING  2006   has been removed  . OSTECTOMY Right 04/08/2017   Procedure: OSTECTOMY RIGHT GREAT TOE;  Surgeon: Caprice Beaver, DPM;  Location: [facility] ORS;  Service: Podiatry;  Laterality: Right;  . PORT-A-CATH REMOVAL Left 12/07/2012   Procedure: REMOVAL PORT-A-CATH;  Surgeon: Scherry Ran, MD;  Location: [facility] ORS;  Service: General;  Laterality: Left;  . PORTACATH PLACEMENT  07/07/2011   Procedure: INSERTION PORT-A-CATH;  Surgeon: Scherry Ran, MD;  Location: [facility] ORS;  Service: General;  Laterality: N/A;  . PORTACATH PLACEMENT N/A 12/07/2012   Procedure: INSERTION PORT-A-CATH;  Surgeon: Scherry Ran, MD;  Location: [facility] ORS;  Service: General;  Laterality: N/A;  Attempted portacath placement on left and right side  . WOUND DEBRIDEMENT Right 04/08/2017   Procedure: EXCISION ULCERATION RIGHT GREAT TOE;  Surgeon: Caprice Beaver, DPM;  Location: [facility] ORS;  Service: Podiatry;  Laterality: Right;  . WRIST SURGERY     Left; removal of bone fragment    Social History:  reports that he quit smoking about 16 years ago. His smoking use included cigarettes and cigars. He has a 0.25 pack-year smoking history. He has never used smokeless tobacco. He reports that he does not drink alcohol or use drugs.  Allergies: No Known Allergies  Medications Prior to Admission  Medication Sig Dispense Refill  . acetaminophen (TYLENOL) 500 MG tablet Take 500-1,000 mg by mouth every 6 (six) hours as needed for mild pain or moderate pain.    Marland Kitchen acyclovir (ZOVIRAX) 400 MG tablet Take 1 tablet (400 mg total) by mouth every morning. 30  tablet 5  . allopurinol (ZYLOPRIM) 300 MG tablet TAKE ONE TABLET BY MOUTH ONCE DAILY. 30 tablet 2  . Artificial Tear Ointment (DRY EYES OP) Place 1 drop into both eyes as needed (dry eyes).    Marland Kitchen aspirin EC 81 MG tablet Take 81 mg by mouth daily.    Marland Kitchen atorvastatin (LIPITOR) 80 MG tablet TAKE ONE TABLET BY MOUTH AT BEDTIME. 90 tablet 3  . calcitRIOL (ROCALTROL) 0.25 MCG capsule Take 0.25 mcg by mouth 3 (three) times a week. Monday, Wednesday, Friday.    . Calcium Carbonate (CALCIUM 600 PO) Take 1 tablet by mouth 2 (two) times daily.    Marland Kitchen dexlansoprazole (DEXILANT) 60 MG capsule Take 60 mg by mouth every morning.    . dicyclomine (BENTYL) 20 MG tablet TAKE 1 TABLET BY MOUTH BEFORE MEALS 3 TIMES DAILY. 90 tablet 3  . Eluxadoline (VIBERZI) 100 MG TABS Take 100 mg 2 (two) times daily with a meal by mouth.     . gabapentin (NEURONTIN) 300 MG capsule  Take 1 capsule (300 mg total) by mouth 2 (two) times daily. 60 capsule 0  . insulin detemir (LEVEMIR) 100 UNIT/ML injection Inject 0.1 mLs (10 Units total) into the skin every morning. 10 mL 11  . insulin lispro (HUMALOG) 100 UNIT/ML injection Inject 0-0.1 mLs (0-10 Units total) into the skin 2 (two) times daily with a meal. Sliding Scale per patient  CBG 150-200: 1 units CBG 201-275:  3 units CBG 275-350: 5 units 351-425: 7 units 425-500: 9 units 500+: 10 units 10 mL 0  . loratadine (CLARITIN) 10 MG tablet Take 10 mg by mouth every morning.     . Magnesium Cl-Calcium Carbonate (SLOW-MAG PO) Take 1 tablet by mouth every morning.    . methocarbamol (ROBAXIN) 500 MG tablet Take 1 tablet (500 mg total) by mouth every 8 (eight) hours as needed for muscle spasms. 15 tablet 0  . Multiple Vitamins-Minerals (MULTIVITAMINS THER. W/MINERALS) TABS Take 1 tablet by mouth daily.     . nitroGLYCERIN (NITROSTAT) 0.4 MG SL tablet DISSOLVE 1 TABLET UNDER TONGUE EVERY 5 MINUTES UP TO 15 MIN FOR CHESTPAIN. IF NO RELIEF CALL 911. 25 tablet 0  . potassium chloride SA  (K-DUR,KLOR-CON) 20 MEQ tablet Take 1 tablet (20 mEq total) by mouth daily. 30 tablet 0  . sulfamethoxazole-trimethoprim (BACTRIM DS,SEPTRA DS) 800-160 MG tablet TAKE ONE TABLET BY MOUTH EVERY MONDAY, WEDNESDAY, AND FRIDAY. 12 tablet 0  . torsemide (DEMADEX) 20 MG tablet Take 1 tablet (20 mg total) by mouth daily. Take extra 1 tab for weight gain of 3 lbs in 1 day or 5 lbs in 2 days (Patient taking differently: Take 20 mg by mouth 2 (two) times daily. Take extra 1 tab for weight gain of 3 lbs in 1 day or 5 lbs in 2 days) 10 tablet 0  . Vitamin D, Ergocalciferol, (DRISDOL) 50000 units CAPS capsule TAKE 1 CAPSULE BY MOUTH EVERY THIRTY DAYS. 3 capsule PRN  . XARELTO 20 MG TABS tablet TAKE 1 TABLET BY MOUTH DAILY. 30 tablet 6  . sulfamethoxazole-trimethoprim (BACTRIM DS,SEPTRA DS) 800-160 MG tablet TAKE ONE TABLET BY MOUTH EVERY MONDAY, WEDNESDAY, AND FRIDAY. (Patient not taking: Reported on 09/30/2017) 12 tablet 0  . traMADol (ULTRAM) 50 MG tablet Take 1 tablet (50 mg total) by mouth every 6 (six) hours as needed. (Patient not taking: Reported on 09/30/2017) 20 tablet 0    Blood pressure (!) 73/52, pulse 81, temperature 98.8 F (37.1 C), temperature source Oral, resp. rate (!) 30, height _0  (1.88 m), weight (!) 159.5 kg (351 lb 10.1 oz), SpO2 100 %. Physical Exam: Physical Exam  Obese, male Some increased work of breathing Right chest - port site; non-tender; no erythema; no fluctuance  Results for orders placed or performed during the hospital encounter of 09/30/17 (from the past 48 hour(s))  Comprehensive metabolic panel     Status: Abnormal   Collection Time: 09/30/17  5:32 PM  Result Value Ref Range   Sodium 132 (L) 135 - 145 mmol/L   Potassium 4.6 3.5 - 5.1 mmol/L   Chloride 100 (L) 101 - 111 mmol/L   CO2 18 (L) 22 - 32 mmol/L   Glucose, Bld 100 (H) 65 - 99 mg/dL   BUN 54 (H) 6 - 20 mg/dL   Creatinine, Ser 3.48 (H) 0.61 - 1.24 mg/dL   Calcium 8.3 (L) 8.9 - 10.3 mg/dL   Total Protein 6.1  (L) 6.5 - 8.1 g/dL   Albumin 2.7 (L) 3.5 - 5.0 g/dL  AST 94 (H) 15 - 41 U/L   ALT 63 17 - 63 U/L   Alkaline Phosphatase 64 38 - 126 U/L   Total Bilirubin 0.9 0.3 - 1.2 mg/dL   GFR calc non Af Amer 18 (L) >60 mL/min   GFR calc Af Amer 21 (L) >60 mL/min    Comment: (NOTE) The eGFR has been calculated using the CKD EPI equation. This calculation has not been validated in all clinical situations. eGFR's persistently <60 mL/min signify possible Chronic Kidney Disease.    Anion gap 14 5 - 15    Comment: Performed at [facility], [address] [place], [place] [address]  CBC with Differential     Status: Abnormal   Collection Time: 09/30/17  5:32 PM  Result Value Ref Range   WBC 21.4 (H) 4.0 - 10.5 K/uL   RBC 3.51 (L) 4.22 - 5.81 MIL/uL   Hemoglobin 10.6 (L) 13.0 - 17.0 g/dL   HCT 32.9 (L) 39.0 - 52.0 %   MCV 93.7 78.0 - 100.0 fL   MCH 30.2 26.0 - 34.0 pg   MCHC 32.2 30.0 - 36.0 g/dL   RDW 17.5 (H) 11.5 - 15.5 %   Platelets 104 (L) 150 - 400 K/uL    Comment: SPECIMEN CHECKED FOR CLOTS PLATELET COUNT CONFIRMED BY SMEAR    Neutrophils Relative % 90 %   Neutro Abs 19.2 1.7 - 7.7 K/uL   Lymphocytes Relative 4 %   Lymphs Abs 0.9 0.7 - 4.0 K/uL   Monocytes Relative 6 %   Monocytes Absolute 1.3 (H) 0.1 - 1.0 K/uL   Eosinophils Relative 0 %   Eosinophils Absolute 0.0 0.0 - 0.7 K/uL   Basophils Relative 0 %   Basophils Absolute 0.0 0.0 - 0.1 K/uL   WBC Morphology WHITE COUNT CONFIRMED ON SMEAR     Comment: Performed at [facility], [address] [place], [place] [address]  Culture, blood (Routine x 2)     Status: None (Preliminary result)   Collection Time: 09/30/17  5:32 PM  Result Value Ref Range   Specimen Description      RIGHT ANTECUBITAL Performed at [facility], [address], [place], [place] [address]    Special Requests      BOTTLES DRAWN AEROBIC AND ANAEROBIC Blood Culture adequate volume Performed at [facility], [address], [place], [place] [address]     Culture  Setup Time      GRAM NEGATIVE RODS ANAEROBIC BOTTLE Gram Stain Report Called to,Read Back By and Verified With: FIELDS S. AT [place] AT 0920A ON [id number] BY THOMPSON S. GRAM POSITIVE COCCI IN CLUSTERS RECOVERED FROM THE AEROBIC BOTTLE Gram Stain Report Called to,Read Back By and Verified With: JOHNSON,J. AT 2549 ON 10/01/2017 BY BAUGHAM,M. Performed at [place].  VALUE IS CONSISTENT WITH PREVIOUSLY REPORTED AND CALLED VALUE. Performed at [facility], [place] [address] [place], [place] [address]    Culture      NO GROWTH < 24 HOURS Performed at [facility], [address], [place], [place] [address]    Report Status PENDING   Urinalysis, Routine w reflex microscopic     Status: Abnormal   Collection Time: 09/30/17  5:32 PM  Result Value Ref Range   Color, Urine YELLOW YELLOW   APPearance TURBID (A) CLEAR   Specific Gravity, Urine 1.017 1.005 - 1.030   pH 7.0 5.0 - 8.0   Glucose, UA NEGATIVE NEGATIVE mg/dL   Hgb  urine dipstick LARGE (A) NEGATIVE   Bilirubin Urine NEGATIVE NEGATIVE   Ketones, ur NEGATIVE NEGATIVE mg/dL   Protein, ur 100 (A) NEGATIVE mg/dL   Nitrite NEGATIVE NEGATIVE   Leukocytes, UA MODERATE (A) NEGATIVE   RBC / HPF 21-50 0 - 5 RBC/hpf   WBC, UA 21-50 0 - 5 WBC/hpf   Bacteria, UA RARE (A) NONE SEEN   Squamous Epithelial / LPF 0-5 0 - 5    Comment: Please note change in reference range. Performed at [facility], [address], [place], [place] [address]   Culture, blood (Routine x 2)     Status: None (Preliminary result)   Collection Time: 09/30/17  5:37 PM  Result Value Ref Range   Specimen Description      RIGHT ANTECUBITAL Performed at [place] [place], [place] [address]    Special Requests      BOTTLES DRAWN AEROBIC AND ANAEROBIC Blood Culture adequate volume Performed at [place] [place], [place] [address]    Culture  Setup Time      GRAM NEGATIVE RODS ANEROBIC BOTTLE Gram  Stain Report Called to,Read Back By and Verified With: FIELDS S. AT [facility] AT 0920A ON [id number] BY THOMPSON S. GRAM POSITIVE COCCI IN CLUSTERS REOVERED FROM THE AEROBIC BOTTLE. Gram Stain Report Called to,Read Back By and Verified With: JOHNSON,J. AT 1258 ON 10/01/2017 BY BAUGHAM,M. Performed at [facility] Organism ID to follow ANAEROBIC BOTTLE ONLY Performed at [facility], [place] [address] [place], [place] [address]    Culture      NO GROWTH < 24 HOURS Performed at [facility], [address], [place],  [address]    Report Status PENDING   Blood Culture ID Panel (Reflexed)     Status: Abnormal   Collection Time: 09/30/17  5:37 PM  Result Value Ref Range   Enterococcus species NOT DETECTED NOT DETECTED   Listeria monocytogenes NOT DETECTED NOT DETECTED   Staphylococcus species DETECTED (A) NOT DETECTED    Comment: CRITICAL RESULT CALLED TO, READ BACK BY AND VERIFIED WITH: Asencion Islam PharmD 16:05 10/01/17 (wil[place]m)    Staphylococcus aureus DETECTED (A) NOT DETECTED    Comment: Methicillin (oxacillin)-resistant Staphylococcus aureus (MRSA). MRSA is predictably resistant to beta-lactam antibiotics (except ceftaroline). Preferred therapy is vancomycin unless clinically contraindicated. Patient requires contact precautions if  hospitalized. CRITICAL RESULT CALLED TO, READ BACK BY AND VERIFIED WITH: Asencion Islam PharmD 16:05 10/01/17 (wilsonm)    Methicillin resistance DETECTED (A) NOT DETECTED    Comment: CRITICAL RESULT CALLED TO, READ BACK BY AND VERIFIED WITH: B. Mancheril PharmD 16:05 10/01/17 (wilsonm)    Streptococcus species NOT DETECTED NOT DETECTED   Streptococcus agalactiae NOT DETECTED NOT DETECTED   Streptococcus pneumoniae NOT DETECTED NOT DETECTED   Streptococcus pyogenes NOT DETECTED NOT DETECTED   Acinetobacter baumannii NOT DETECTED NOT DETECTED   Enterobacteriaceae species DETECTED (A) NOT DETECTED    Comment: Enterobacteriaceae represent a large family  of gram-negative bacteria, not a single organism. CRITICAL RESULT CALLED TO, READ BACK BY AND VERIFIED WITH: Asencion Islam PharmD 16:05 10/01/17 (wilsonm)    Enterobacter cloacae complex NOT DETECTED NOT DETECTED   Escherichia coli DETECTED (A) NOT DETECTED    Comment: CRITICAL RESULT CALLED TO, READ BACK BY AND VERIFIED WITH: Asencion Islam PharmD 16:05 10/01/17 (wilsonm)    Klebsiella oxytoca NOT DETECTED NOT DETECTED   Klebsiella pneumoniae NOT DETECTED NOT DETECTED   Proteus species NOT DETECTED NOT DETECTED  Serratia marcescens NOT DETECTED NOT DETECTED   Carbapenem resistance NOT DETECTED NOT DETECTED   Haemophilus influenzae NOT DETECTED NOT DETECTED   Neisseria meningitidis NOT DETECTED NOT DETECTED   Pseudomonas aeruginosa NOT DETECTED NOT DETECTED   Candida albicans NOT DETECTED NOT DETECTED   Candida glabrata NOT DETECTED NOT DETECTED   Candida krusei NOT DETECTED NOT DETECTED   Candida parapsilosis NOT DETECTED NOT DETECTED   Candida tropicalis NOT DETECTED NOT DETECTED    Comment: Performed at [place] [address] [place], [place] [address]  I-Stat CG4 Lactic Acid, ED     Status: Abnormal   Collection Time: 09/30/17  5:50 PM  Result Value Ref Range   Lactic Acid, Venous 3.77 (HH) 0.5 - 1.9 mmol/L   Comment NOTIFIED PHYSICIAN   Protime-INR     Status: Abnormal   Collection Time: 09/30/17  6:55 PM  Result Value Ref Range   Prothrombin Time 29.8 (H) 11.4 - 15.2 seconds   INR 2.86     Comment: Performed at [facility], [address] [place], [place] [address]  I-Stat CG4 Lactic Acid, ED     Status: Abnormal   Collection Time: 09/30/17  7:39 PM  Result Value Ref Range   Lactic Acid, Venous 3.41 (HH) 0.5 - 1.9 mmol/L  Glucose, capillary     Status: Abnormal   Collection Time: 10/01/17  1:39 AM  Result Value Ref Range   Glucose-Capillary 107 (H) 65 - 99 mg/dL  MRSA PCR Screening     Status: Abnormal   Collection Time: 10/01/17  2:26 AM  Result Value Ref  Range   MRSA by PCR POSITIVE (A) NEGATIVE    Comment:        The GeneXpert MRSA Assay (FDA approved for NASAL specimens only), is one component of a comprehensive MRSA colonization surveillance program. It is not intended to diagnose MRSA infection nor to guide or monitor treatment for MRSA infections. RESULT CALLED TO, READ BACK BY AND VERIFIED WITH: B SHEPARD RN 10/01/17 0438 JDW   I-STAT 3, arterial blood gas (G3+)     Status: Abnormal   Collection Time: 10/01/17  2:42 AM  Result Value Ref Range   pH, Arterial 7.333 (L) 7.350 - 7.450   pCO2 arterial 30.1 (L) 32.0 - 48.0 mmHg   pO2, Arterial 113.0 (H) 83.0 - 108.0 mmHg   Bicarbonate 16.0 (L) 20.0 - 28.0 mmol/L   TCO2 17 (L) 22 - 32 mmol/L   O2 Saturation 98.0 %   Acid-base deficit 9.0 (H) 0.0 - 2.0 mmol/L   Patient temperature 98.6 F    Collection site RADIAL, ALLEN'S TEST ACCEPTABLE    Drawn by RT    Sample type ARTERIAL   Renal function panel     Status: Abnormal   Collection Time: 10/01/17  2:57 AM  Result Value Ref Range   Sodium 134 (L) 135 - 145 mmol/L   Potassium 4.9 3.5 - 5.1 mmol/L   Chloride 106 101 - 111 mmol/L   CO2 18 (L) 22 - 32 mmol/L   Glucose, Bld 126 (H) 65 - 99 mg/dL   BUN 50 (H) 6 - 20 mg/dL   Creatinine, Ser 3.44 (H) 0.61 - 1.24 mg/dL   Calcium 7.6 (L) 8.9 - 10.3 mg/dL   Phosphorus 3.3 2.5 - 4.6 mg/dL   Albumin 2.5 (L) 3.5 - 5.0 g/dL   GFR calc non Af Amer 18 (L) >60 mL/min   GFR calc Af Amer 21 (L) >60  mL/min    Comment: (NOTE) The eGFR has been calculated using the CKD EPI equation. This calculation has not been validated in all clinical situations. eGFR's persistently <60 mL/min signify possible Chronic Kidney Disease.    Anion gap 10 5 - 15    Comment: Performed at [facility] [address], [place], [place] [address]  Magnesium     Status: Abnormal   Collection Time: 10/01/17  2:57 AM  Result Value Ref Range   Magnesium 1.4 (L) 1.7 - 2.4 mg/dL    Comment: Performed at [place] [address] [place], [place] [address]  Phosphorus     Status: None   Collection Time: 10/01/17  2:57 AM  Result Value Ref Range   Phosphorus 3.2 2.5 - 4.6 mg/dL    Comment: Performed at [place] [address], [place], [place] [address]  Cortisol     Status: None   Collection Time: 10/01/17  2:57 AM  Result Value Ref Range   Cortisol, Plasma 17.6 ug/dL    Comment: (NOTE) AM    6.7 - 22.6 ug/dL PM   <10.0       ug/dL Performed at [place] [address], [place], [place] [address]   CBC     Status: Abnormal   Collection Time: 10/01/17  2:57 AM  Result Value Ref Range   WBC 27.8 (H) 4.0 - 10.5 K/uL   RBC 3.34 (L) 4.22 - 5.81 MIL/uL   Hemoglobin 10.1 (L) 13.0 - 17.0 g/dL   HCT 31.0 (L) 39.0 - 52.0 %   MCV 92.8 78.0 - 100.0 fL   MCH 30.2 26.0 - 34.0 pg   MCHC 32.6 30.0 - 36.0 g/dL   RDW 17.6 (H) 11.5 - 15.5 %   Platelets 113 (L) 150 - 400 K/uL    Comment: REPEATED TO VERIFY SPECIMEN CHECKED FOR CLOTS PLATELET COUNT CONFIRMED BY SMEAR Performed at [place] [address] [place], [place] [address]   Protime-INR     Status: Abnormal   Collection Time: 10/01/17  2:57 AM  Result Value Ref Range   Prothrombin Time 29.2 (H) 11.4 - 15.2 seconds   INR 2.79     Comment: Performed at [facility] [address] [place],  [address]  Procalcitonin - Baseline     Status: None   Collection Time: 10/01/17  2:57 AM  Result Value Ref Range   Procalcitonin >150.00 ng/mL    Comment:        Interpretation: PCT >= 10 ng/mL: Important systemic inflammatory response, almost exclusively due to severe bacterial sepsis or septic shock. (NOTE)       Sepsis PCT Algorithm           Lower Respiratory Tract                                      Infection PCT Algorithm    ----------------------------     ----------------------------         PCT < 0.25 ng/mL                PCT < 0.10 ng/mL         Strongly encourage              Strongly discourage   discontinuation of antibiotics    initiation of antibiotics    ----------------------------     -----------------------------  PCT 0.25 - 0.50 ng/mL            PCT 0.10 - 0.25 ng/mL               OR       >80% decrease in PCT            Discourage initiation of                                            antibiotics      Encourage discontinuation           of antibiotics    ----------------------------     -----------------------------         PCT >= 0.50 ng/mL              PCT 0.26 - 0.50 ng/mL                AND       <80% decrease in PCT             Encourage initiation of                                             antibiotics       Encourage continuation           of antibiotics    ----------------------------     -----------------------------        PCT >= 0.50 ng/mL                  PCT > 0.50 ng/mL               AND         increase in PCT                  Strongly encourage                                      initiation of antibiotics    Strongly encourage escalation           of antibiotics                                     -----------------------------                                           PCT <= 0.25 ng/mL                                                 OR                                        > 80% decrease in PCT  Discontinue / Do not initiate                                             antibiotics Performed at [facility], [place] [address] [place], [place] [address]   Lactic acid, plasma     Status: Abnormal   Collection Time: 10/01/17  3:00 AM  Result Value Ref Range   Lactic Acid, Venous 2.6 (HH) 0.5 - 1.9 mmol/L    Comment: CRITICAL RESULT CALLED TO, READ BACK BY AND VERIFIED WITH: SHEPHERD,B RN 10/01/2017 0351 JORDANS Performed at [facility], [place] [address], [place], [place] [address]   Glucose, capillary     Status: Abnormal   Collection Time: 10/01/17  3:38 AM  Result  Value Ref Range   Glucose-Capillary 123 (H) 65 - 99 mg/dL  Type and screen     Status: None   Collection Time: 10/01/17  3:45 AM  Result Value Ref Range   ABO/RH(D) O POS    Antibody Screen NEG    Sample Expiration      10/04/2017 Performed at [facility], [place] [address], [place], [place] [address]   ABO/Rh     Status: None   Collection Time: 10/01/17  3:45 AM  Result Value Ref Range   ABO/RH(D)      O POS Performed at [place] [address], [place], [place] [address]   Blood gas, arterial     Status: Abnormal   Collection Time: 10/01/17  4:42 AM  Result Value Ref Range   O2 Content 5.0 L/min   Delivery systems CONTINUOUS POSITIVE AIRWAY PRESSURE    Expiratory PAP 6    pH, Arterial 7.355 7.350 - 7.450   pCO2 arterial 30.5 (L) 32.0 - 48.0 mmHg   pO2, Arterial 184 (H) 83.0 - 108.0 mmHg   Bicarbonate 16.6 (L) 20.0 - 28.0 mmol/L   Acid-base deficit 7.9 (H) 0.0 - 2.0 mmol/L   O2 Saturation 99.0 %   Patient temperature 98.6    Collection site A-LINE    Drawn by [id number]    Sample type ARTERIAL DRAW    Allens test (pass/fail) PASS PASS  Glucose, capillary     Status: Abnormal   Collection Time: 10/01/17  7:40 AM  Result Value Ref Range   Glucose-Capillary 122 (H) 65 - 99 mg/dL  Glucose, capillary     Status: Abnormal   Collection Time: 10/01/17 11:45 AM  Result Value Ref Range   Glucose-Capillary 139 (H) 65 - 99 mg/dL  Lactic acid, plasma     Status: None   Collection Time: 10/01/17 12:19 PM  Result Value Ref Range   Lactic Acid, Venous 1.9 0.5 - 1.9 mmol/L    Comment: Performed at [facility], [place] [address] [place], [place] [address]  Troponin I     Status: Abnormal   Collection Time: 10/01/17 12:19 PM  Result Value Ref Range   Troponin I 0.16 (HH) <0.03 ng/mL    Comment: CRITICAL RESULT CALLED TO, READ BACK BY AND VERIFIED WITH: I.BAYNES,RN 1403 10/01/17 CLARK,S Performed at [facility], [place] [address] [place], [place] [address]    APTT     Status: Abnormal   Collection Time: 10/01/17 12:19 PM  Result Value Ref Range   aPTT 70 (H) 24 - 36 seconds  Comment:        IF BASELINE aPTT IS ELEVATED, SUGGEST PATIENT RISK ASSESSMENT BE USED TO DETERMINE APPROPRIATE ANTICOAGULANT THERAPY. Performed at [facility], [place] [address], [place], [place] [address]   Heparin level (unfractionated)     Status: Abnormal   Collection Time: 10/01/17 12:19 PM  Result Value Ref Range   Heparin Unfractionated >2.20 (H) 0.30 - 0.70 IU/mL    Comment: RESULTS CONFIRMED BY MANUAL DILUTION IF HEPARIN RESULTS ARE BELOW EXPECTED VALUES, AND PATIENT DOSAGE HAS BEEN CONFIRMED, SUGGEST FOLLOW UP TESTING OF ANTITHROMBIN III LEVELS. Performed at [facility], [place] [address] [place], [place] [address]   Glucose, capillary     Status: Abnormal   Collection Time: 10/01/17  3:36 PM  Result Value Ref Range   Glucose-Capillary 141 (H) 65 - 99 mg/dL   *Note: Due to a large number of results and/or encounters for the requested time period, some results have not been displayed. A complete set of results can be found in Results Review.   Ct Abdomen Pelvis Wo Contrast  Result Date: 09/30/2017 CLINICAL DATA:  57 year old male presents with hematuria, sepsis and fever. Hypotension. History of obstructing ureteral stones. EXAM: CT ABDOMEN AND PELVIS WITHOUT CONTRAST TECHNIQUE: Multidetector CT imaging of the abdomen and pelvis was performed following the standard protocol without IV contrast. COMPARISON:  07/30/2017 FINDINGS: Lower chest: New bibasilar pulmonary consolidations likely reflecting atelectasis, left greater than right. Superimposed pneumonia would be difficult to entirely exclude given history of fever and sepsis. Heart size is top normal in size without pericardial effusion. Hepatobiliary: 6 mm dependent gallstone within a non thickened but distended gallbladder. This could be due to a fasting state. The unenhanced liver is  unremarkable. Pancreas: No ductal dilatation or mass given limitations of a noncontrast study. Spleen: Normal size spleen. Adrenals/Urinary Tract: Normal bilateral adrenal glands. Right-sided double pigtail ureteral stent is identified with the proximal coil in the right renal pelvis and distal coil in the decompressed urinary bladder. No calculus identified along the course of the ureteral stent. A Foley catheter decompresses the urinary bladder. Left kidney is unremarkable apart from a punctate nonobstructing left renal calculus. Moderate perinephric fat stranding is noted. Stomach/Bowel: Small hiatal hernia. Decompressed stomach with normal small bowel rotation. Normal appendix. Minimal scattered colonic diverticulosis without acute diverticulitis along the left colon. Vascular/Lymphatic: No aortic aneurysm. Aortoiliac and branch vessel atherosclerosis. No aneurysm or adenopathy. Reproductive: Normal size prostate and seminal vesicles. Other: No free air. Small fat containing umbilical hernia. Small fat containing right inguinal hernia. Musculoskeletal: Thoracolumbar spondylosis with multilevel degenerative disc disease most marked from L2 through S1 in the lumbar spine. Associated facet arthropathy is seen. IMPRESSION: 1. Uncomplicated cholelithiasis. 2. Nonobstructing left lower pole renal calculus. No hydroureteronephrosis. Stable right double pigtail ureteral stent in place with decompressed bladder containing a Foley catheter. 3. Patchy airspace opacities at each lung base, left greater than right compatible with atelectasis. Minimal superimposed pneumonia is not excluded given history of fever and sepsis. 4. Thoracolumbar spondylosis. Electronically Signed   By: Ashley Royalty M.D.   On: 09/30/2017 20:40   Dg Chest Port 1 View  Result Date: 09/30/2017 CLINICAL DATA:  Altered mental status. EXAM: PORTABLE CHEST 1 VIEW COMPARISON:  08/13/2017 FINDINGS: Right-sided power port remains in appropriate position.  Low lung volumes are again seen. Rounded opacity in the left lung base is again seen which is consistent with a a left diaphragmatic hernia is as shown on previous CT. Cardiomegaly  and pulmonary vascular congestion are stable. No evidence of acute infiltrate or pleural effusion. IMPRESSION: No acute findings. Stable cardiomegaly and pulmonary vascular congestion. Stable left diaphragmatic hernia. Electronically Signed   By: Earle Gell M.D.   On: 09/30/2017 18:41   [place] Ekg Site Rite  Result Date: 10/01/2017 If Site Rite image not attached, placement could not be confirmed due to current cardiac rhythm.     Assessment/Plan Severe septic shock secondary to UTI MRSA bacteremia - blood cxs also growing gram neg rods Acute metabolic encephalopathy AKI on CKD R ureteral stent - placed 3/1 Chronic foley OSA Anemia of chronic disease Mutliple myeloma Thrombocytopenia Diabetes Mellitus Hx of MI 2000  Consult for portacath removal due to MRSA and E. Coli bacteremia.  Will plan to hold heparin gtt at 0600 tomorrow and plan port removal.  This may occur at the bedside if necessary.  Discussed with family.  -   Brigid Re, [facility] 10/01/2017, 4:16 PM Pager: [phone] Consults: [phone] Mon-Fri 7:00 am-4:30 pm Sat-Sun 7:00 am-11:30 am   Imogene Burn. Georgette Dover, MD, [facility]  General/ Trauma Surgery  10/01/2017 4:47 PM

## 2017-10-01 NOTE — Procedures (Signed)
Intubation Procedure Note [name] [id number] 10-19-1960  Procedure: Intubation Indications: Airway protection and maintenance  Procedure Details Consent: Risks of procedure as well as the alternatives and risks of each were explained to the (patient/caregiver).  Consent for procedure obtained. Time Out: Verified patient identification, verified procedure, site/side was marked, verified correct patient position, special equipment/implants available, medications/allergies/relevent history reviewed, required imaging and test results available.  Performed  Drugs: Versed 4mg  IV, Fentanyl 118mcg IV, Etomidate 20mg  IV daily, Rocuronium 100mg  IV DL x 1 with GS 4 blade Grade 1 view 8.0 ET tube passed through cords under direct visualization Placement confirmed with bilateral breath sounds, positive EtCO2 change and smoke in tube   Evaluation Hemodynamic Status: BP stable throughout; O2 sats: stable throughout Patient's Current Condition: stable Complications: No apparent complications Patient did tolerate procedure well. Chest X-ray ordered to verify placement.  CXR: pending.   Simonne Maffucci 10/01/2017

## 2017-10-01 NOTE — Progress Notes (Signed)
Patient's BP is inaccurate with cuff pressure. Levophed is being titrated according to A-line values.

## 2017-10-01 NOTE — Progress Notes (Signed)
Pharmacy Antibiotic Note  [name] is a 57 y.o. male admitted on 09/30/2017 with UTI, septic shock  Pharmacy has been consulted for vancomycin and zosyn dosing. 2/2 blood cx grew out GNR in anerobic bottles and GPC in clusters in anerobic bottles. WBC 27.8, PCT > 150. SCr elevated at 3.44 (BL ~ 1.5)   Plan: Zosyn 3.375g IV q8h (4 hour infusion).  Adjust vancomycin to 2 gm IV Q 48 hours  Monitor renal function, cultures and clincial course  Height: 6\' 2"  (188 cm) Weight: (!) 351 lb 10.1 oz (159.5 kg) IBW/kg (Calculated) : 82.2  Temp (24hrs), Avg:100.5 F (38.1 C), Min:98.8 F (37.1 C), Max:102.2 F (39 C)  Recent Labs  Lab 09/30/17 1732 09/30/17 1750 09/30/17 1939 10/01/17 0257 10/01/17 0300 10/01/17 1219  WBC 21.4*  --   --  27.8*  --   --   CREATININE 3.48*  --   --  3.44*  --   --   LATICACIDVEN  --  3.77* 3.41*  --  2.6* 1.9    Estimated Creatinine Clearance: 37.4 mL/min (A) (by C-G formula based on SCr of 3.44 mg/dL (H)).    No Known Allergies  Antimicrobials this admission: Vanc 5/1>> Zosyn 5/1>>   5/1 MRSA PCR pos 5/1 BCx> 2/2 GNR in anerobic bottle, 2/2 GPC in cluster in aerobic bottle  5/1 UCx > sent    Thank you for allowing pharmacy to be a part of this patient's care.  Albertina Parr, PharmD., BCPS Clinical Pharmacist Clinical phone for 10/01/17 until 3:30pm: [phone] If after 3:30pm, please call main pharmacy at: [phone]

## 2017-10-01 NOTE — Progress Notes (Signed)
PHARMACY - PHYSICIAN COMMUNICATION CRITICAL VALUE ALERT - BLOOD CULTURE IDENTIFICATION (BCID)  [name] is an 57 y.o. male who presented to [facility] on 09/30/2017 with a chief complaint of altered mental status  Assessment:  Sepsis with likely UTI now growing 2/2 E.coli and 2/2 MRSA in blood cultures   Name of physician (or Provider) Contacted: Dr. Lake Bells   Current antibiotics: Vancomycin and Zosyn  Changes to prescribed antibiotics recommended:  Patient is on recommended antibiotics - No changes needed  Results for orders placed or performed during the hospital encounter of 09/30/17  Blood Culture ID Panel (Reflexed) (Collected: 09/30/2017  5:37 PM)  Result Value Ref Range   Enterococcus species NOT DETECTED NOT DETECTED   Listeria monocytogenes NOT DETECTED NOT DETECTED   Staphylococcus species DETECTED (A) NOT DETECTED   Staphylococcus aureus DETECTED (A) NOT DETECTED   Methicillin resistance DETECTED (A) NOT DETECTED   Streptococcus species NOT DETECTED NOT DETECTED   Streptococcus agalactiae NOT DETECTED NOT DETECTED   Streptococcus pneumoniae NOT DETECTED NOT DETECTED   Streptococcus pyogenes NOT DETECTED NOT DETECTED   Acinetobacter baumannii NOT DETECTED NOT DETECTED   Enterobacteriaceae species DETECTED (A) NOT DETECTED   Enterobacter cloacae complex NOT DETECTED NOT DETECTED   Escherichia coli DETECTED (A) NOT DETECTED   Klebsiella oxytoca NOT DETECTED NOT DETECTED   Klebsiella pneumoniae NOT DETECTED NOT DETECTED   Proteus species NOT DETECTED NOT DETECTED   Serratia marcescens NOT DETECTED NOT DETECTED   Carbapenem resistance NOT DETECTED NOT DETECTED   Haemophilus influenzae NOT DETECTED NOT DETECTED   Neisseria meningitidis NOT DETECTED NOT DETECTED   Pseudomonas aeruginosa NOT DETECTED NOT DETECTED   Candida albicans NOT DETECTED NOT DETECTED   Candida glabrata NOT DETECTED NOT DETECTED   Candida krusei NOT DETECTED NOT DETECTED   Candida parapsilosis  NOT DETECTED NOT DETECTED   Candida tropicalis NOT DETECTED NOT DETECTED    Albertina Parr, PharmD., BCPS Clinical Pharmacist Clinical phone for 10/01/17 until 3:30pm: [phone] If after 3:30pm, please call main pharmacy at: [phone]

## 2017-10-01 NOTE — Progress Notes (Signed)
Pharmacy Antibiotic Note  [name] is a 57 y.o. male admitted on 09/30/2017 with UTI, septic shock  Pharmacy has been consulted for vancomycin and zosyn dosing.One gram of vancomycin and 3.375 gm zosyn given yesterday. He will also start heparin as xarelto on hold.  Last dose xarelto 5/1 @ 10:30.  Plan: Zosyn 3.375g IV q8h (4 hour infusion).  Vancomycin 1750 mg IV q24 hours Start heparin drip at 1600 units/hr at 10:00. Check baseline aPTT and heparin level APTT and heparin level 8 hours after start Monitor renal function, cultures and clincial course  Weight: (!) 351 lb 10.1 oz (159.5 kg)  Temp (24hrs), Avg:100.6 F (38.1 C), Min:99.4 F (37.4 C), Max:102.2 F (39 C)  Recent Labs  Lab 09/30/17 1732 09/30/17 1750 09/30/17 1939 10/01/17 0257 10/01/17 0300  WBC 21.4*  --   --  27.8*  --   CREATININE 3.48*  --   --   --   --   LATICACIDVEN  --  3.77* 3.41*  --  2.6*    Estimated Creatinine Clearance: 37 mL/min (A) (by C-G formula based on SCr of 3.48 mg/dL (H)).    No Known Allergies  Antimicrobials this admission: vanc 5/1 >>  zosyn 5/1 >>    Thank you for allowing pharmacy to be a part of this patient's care.  Excell Seltzer Poteet 10/01/2017 5:40 AM

## 2017-10-01 NOTE — Progress Notes (Addendum)
[facility] P[facility]M  Labs reviewed: MRSA in blood, ecoli in blood  Have notified urology given the recent stent and pyelo on CT ID consulted Gen surg consulted to remove perm cath  Will intubate for airway protection given waxing and waning mental status   Would like line holiday ideally, remove all central lines after off vasopressors, hold PICC  Wife updated bedside by me  Roselie Awkward, MD [place] [facility] Pager: [phone] Cell: [phone] After 3pm or if no response, call [phone]

## 2017-10-01 NOTE — Progress Notes (Addendum)
[place] PCCM AM Follow-up    Brief Summary: 57 y/o M admitted with altered mental status, septic shock secondary to UTI.  He was transferred from [facility] to [facility] in the early am of 5/2.  He recently underwent ureteral stent placement.  The patient was volume resuscitated, treated with IV abx and vasopressors.  CT of the abdomen was negative for hydronephrosis, ureteral stent in place. ABG without hypercapnia.     S:  RN reports pt remains on 22 mcg's of levophed.  Vasopressin not yet started.  BiPAP removed this am.     O: Blood pressure (!) 73/52, pulse 100, temperature (!) 101.2 F (38.4 C), temperature source Axillary, resp. rate (!) 32, height '6\' 2"'  (1.88 m), weight (!) 351 lb 10.1 oz (159.5 kg), SpO2 100 %.  General: morbidly obese male in NAD, lying in bed  HEENT: MM pink/moist, fair dentition Neuro: eyes open / awake, attempts to communicate, appropriate at times but remains encephalopathic, MAE, generalized weakness  CV: s1s2 rrr, no m/r/g.  R chest wall port. PULM: even/non-labored, lungs bilaterally clear  HF:GBMS, non-tender, bsx4 active  Extremities: warm/dry, trace edema  Skin: no rashes or lesions.  R Fem TLC > clean / appropriately dressed.   CBC    Component Value Date/Time   WBC 27.8 (H) 10/01/2017 0257   RBC 3.34 (L) 10/01/2017 0257   HGB 10.1 (L) 10/01/2017 0257   HCT 31.0 (L) 10/01/2017 0257   PLT 113 (L) 10/01/2017 0257   MCV 92.8 10/01/2017 0257   MCH 30.2 10/01/2017 0257   MCHC 32.6 10/01/2017 0257   RDW 17.6 (H) 10/01/2017 0257   LYMPHSABS 0.9 09/30/2017 1732   MONOABS 1.3 (H) 09/30/2017 1732   EOSABS 0.0 09/30/2017 1732   BASOSABS 0.0 09/30/2017 1732   BMP Latest Ref Rng & Units 10/01/2017 09/30/2017 08/14/2017  Glucose 65 - 99 mg/dL 126(H) 100(H) 159(H)  BUN 6 - 20 mg/dL 50(H) 54(H) 17  Creatinine 0.61 - 1.24 mg/dL 3.44(H) 3.48(H) 1.54(H)  Sodium 135 - 145 mmol/L 134(L) 132(L) 137  Potassium 3.5 - 5.1 mmol/L 4.9 4.6 4.1  Chloride 101 - 111 mmol/L 106  100(L) 101  CO2 22 - 32 mmol/L 18(L) 18(L) 25  Calcium 8.9 - 10.3 mg/dL 7.6(L) 8.3(L) 8.3(L)     A: Septic Shock secondary to UTI  GNR Bacteremia  Fever Acute Metabolic Encephalopathy  Acute Kidney Injury  Right Ureteral Stent - placed 3/1 Chronic Foley - changed on admit to [facility] At Risk Intubation  OSA  Anemia  Thrombocytopenia  Multiple Myeloma  DM   P: See full H&P from 0430 am Follow cultures > note GNR in blood cultures from [facility] Continue broad spectrum antibiotics for now  Monitor respiratory status closely  Follow up labs in am  Continue stress dose steroids  Ice packs / cooling measures  Allow break from BiPAP  NPO for now > no food until he can clearly ask for it   [name], NP-C [place] [facility] Pgr: [phone] or if no answer [phone] 10/01/2017, 8:16 AM

## 2017-10-01 NOTE — Progress Notes (Signed)
CRITICAL VALUE ALERT  Critical Value: Troponin   Date & Time Notied: 10/01/17 2254  Provider Notified: Warren Lacy MD  Orders Received/Actions taken: Continue to monitor

## 2017-10-01 NOTE — Progress Notes (Signed)
ANTICOAGULATION + ANTIBIOTIC CONSULT NOTE - Follow Up Consult  Pharmacy Consult for Heparin (Xarelto on hold);  Cefepime, Cipro and Vancomycin Indication: atrial fibrillation;  E coli and MRSA bacteremia  No Known Allergies  Patient Measurements: Height: 6\' 2"  (188 cm) Weight: (!) 351 lb 10.1 oz (159.5 kg) IBW/kg (Calculated) : 82.2 Heparin Dosing Weight: 121 kg  Vital Signs: Temp: 99.4 F (37.4 C) (05/02 2000) Temp Source: Axillary (05/02 2000) Pulse Rate: 93 (05/02 1930)  Labs: Recent Labs    09/30/17 1732 09/30/17 1855 10/01/17 0257 10/01/17 1219 10/01/17 1844  HGB 10.6*  --  10.1*  --   --   HCT 32.9*  --  31.0*  --   --   PLT 104*  --  113*  --   --   APTT  --   --   --  70* 79*  LABPROT  --  29.8* 29.2*  --   --   INR  --  2.86 2.79  --   --   HEPARINUNFRC  --   --   --  >2.20* >2.20*  CREATININE 3.48*  --  3.44*  --   --   TROPONINI  --   --   --  0.16*  --     Estimated Creatinine Clearance: 37.4 mL/min (A) (by C-G formula based on SCr of 3.44 mg/dL (H)).  Assessment:   57 yr old male on Xarelto prior to admission for atrial fibrillation.  On IV heparin while Xarelto is on hold.  Planning port-a-cath removal on 5/3.  Last Xarelto dose 5/1 at 10:30am.     APTT is therapeutic (79 seconds) on heparin drip at 1600 units/hr.   Heparin level is >2.20, but falsely elevated due to recent Xarelto doses.     Day # 2 antibiotics for MRSA and E coli bacteremia.  Zosyn stopped,  Cefepime and Cipro begun. Vancomycin to continue.  Scr up from baseline.   Vanc 5/1>> 5/2  Zosyn 5/1>>   Cefepime 5/2>>  Cipro 5/2>>  CHG/Bactroban 5/2>>(5/6)   5/1 Blood x 2 - GNR in anerobic bottle, GPC in clusters in aerobic bottle   5/1 BCID - Staph aureus, resistance detected, and E coli  5/1 Urine - sent  5/1 MRSA PCR: positive   Goal of Therapy:  Heparin level 0.3-0.7 units/ml aPTT 66-102 seconds Monitor platelets by anticoagulation protocol: Yes  Appropriate antibiotic doses  for renal function and infection Vancomycin trough levels 15-20 mcg/ml   Plan:   Continue heparin drip at 1600 units/hr.  Heparin to stop at 6am on 5/3 for port-a-cath removal.  Daily heparin level, aPTT and CBC while on heparin.  Will follow up for timing of resuming anticoagulation post-procedure.  Xarelto on hold.  Cefepime 2 gm IV q12hrs.  Cipro 400 mg IV q12hrs.  Continue Vancomycin 2gm IV q48hrs - next due 5/4 am.  Follow renal function for any need to adjust antiboitic regimens.  Follow up final cultures, clinical progress, antibiotic plans.  Arty Baumgartner , [place] Pager: [phone] 10/01/2017,8:59 PM

## 2017-10-01 NOTE — Progress Notes (Signed)
Critical Troponin 0.16, Brandi O., NP made aware, follow up labs ordered, will continue to monitor.

## 2017-10-02 ENCOUNTER — Encounter (HOSPITAL_COMMUNITY): Payer: Self-pay | Admitting: Anesthesiology

## 2017-10-02 ENCOUNTER — Inpatient Hospital Stay (HOSPITAL_COMMUNITY): Payer: BLUE CROSS/BLUE SHIELD

## 2017-10-02 ENCOUNTER — Encounter (HOSPITAL_COMMUNITY)
Admission: EM | Disposition: A | Discharge status: Home Health | Payer: Self-pay | Source: Home / Self Care | Attending: Pulmonary Disease

## 2017-10-02 DIAGNOSIS — N12 Tubulo-interstitial nephritis, not specified as acute or chronic: Secondary | ICD-10-CM

## 2017-10-02 DIAGNOSIS — Z978 Presence of other specified devices: Secondary | ICD-10-CM

## 2017-10-02 DIAGNOSIS — G9340 Encephalopathy, unspecified: Secondary | ICD-10-CM | POA: Diagnosis present

## 2017-10-02 DIAGNOSIS — C9001 Multiple myeloma in remission: Secondary | ICD-10-CM

## 2017-10-02 DIAGNOSIS — R1915 Other abnormal bowel sounds: Secondary | ICD-10-CM

## 2017-10-02 HISTORY — PX: PORT-A-CATH REMOVAL: SHX5289

## 2017-10-02 LAB — GLUCOSE, CAPILLARY
Glucose-Capillary: 144 mg/dL — ABNORMAL HIGH (ref 65–99)
Glucose-Capillary: 147 mg/dL — ABNORMAL HIGH (ref 65–99)
Glucose-Capillary: 154 mg/dL — ABNORMAL HIGH (ref 65–99)
Glucose-Capillary: 167 mg/dL — ABNORMAL HIGH (ref 65–99)
Glucose-Capillary: 173 mg/dL — ABNORMAL HIGH (ref 65–99)
Glucose-Capillary: 178 mg/dL — ABNORMAL HIGH (ref 65–99)

## 2017-10-02 LAB — POCT I-STAT 3, ART BLOOD GAS (G3+)
Acid-base deficit: 7 mmol/L — ABNORMAL HIGH (ref 0.0–2.0)
Bicarbonate: 17 mmol/L — ABNORMAL LOW (ref 20.0–28.0)
O2 Saturation: 100 %
Patient temperature: 98.6
TCO2: 18 mmol/L — ABNORMAL LOW (ref 22–32)
pCO2 arterial: 28.1 mmHg — ABNORMAL LOW (ref 32.0–48.0)
pH, Arterial: 7.391 (ref 7.350–7.450)
pO2, Arterial: 187 mmHg — ABNORMAL HIGH (ref 83.0–108.0)

## 2017-10-02 LAB — CBC
HCT: 27.6 % — ABNORMAL LOW (ref 39.0–52.0)
Hemoglobin: 9.1 g/dL — ABNORMAL LOW (ref 13.0–17.0)
MCH: 29.9 pg (ref 26.0–34.0)
MCHC: 33 g/dL (ref 30.0–36.0)
MCV: 90.8 fL (ref 78.0–100.0)
Platelets: 107 10*3/uL — ABNORMAL LOW (ref 150–400)
RBC: 3.04 MIL/uL — ABNORMAL LOW (ref 4.22–5.81)
RDW: 17.3 % — ABNORMAL HIGH (ref 11.5–15.5)
WBC: 31.6 10*3/uL — ABNORMAL HIGH (ref 4.0–10.5)

## 2017-10-02 LAB — BASIC METABOLIC PANEL
Anion gap: 11 (ref 5–15)
BUN: 54 mg/dL — ABNORMAL HIGH (ref 6–20)
CO2: 19 mmol/L — ABNORMAL LOW (ref 22–32)
Calcium: 7 mg/dL — ABNORMAL LOW (ref 8.9–10.3)
Chloride: 109 mmol/L (ref 101–111)
Creatinine, Ser: 2.41 mg/dL — ABNORMAL HIGH (ref 0.61–1.24)
GFR calc Af Amer: 32 mL/min — ABNORMAL LOW (ref >60)
GFR calc non Af Amer: 28 mL/min — ABNORMAL LOW (ref >60)
Glucose, Bld: 177 mg/dL — ABNORMAL HIGH (ref 65–99)
Potassium: 4 mmol/L (ref 3.5–5.1)
Sodium: 139 mmol/L (ref 135–145)

## 2017-10-02 LAB — COMPREHENSIVE METABOLIC PANEL
ALT: 42 U/L (ref 17–63)
AST: 50 U/L — ABNORMAL HIGH (ref 15–41)
Albumin: 2.3 g/dL — ABNORMAL LOW (ref 3.5–5.0)
Alkaline Phosphatase: 62 U/L (ref 38–126)
Anion gap: 10 (ref 5–15)
BUN: 53 mg/dL — ABNORMAL HIGH (ref 6–20)
CO2: 21 mmol/L — ABNORMAL LOW (ref 22–32)
Calcium: 7.2 mg/dL — ABNORMAL LOW (ref 8.9–10.3)
Chloride: 109 mmol/L (ref 101–111)
Creatinine, Ser: 2.8 mg/dL — ABNORMAL HIGH (ref 0.61–1.24)
GFR calc Af Amer: 27 mL/min — ABNORMAL LOW (ref >60)
GFR calc non Af Amer: 23 mL/min — ABNORMAL LOW (ref >60)
Glucose, Bld: 161 mg/dL — ABNORMAL HIGH (ref 65–99)
Potassium: 4.2 mmol/L (ref 3.5–5.1)
Sodium: 140 mmol/L (ref 135–145)
Total Bilirubin: 0.8 mg/dL (ref 0.3–1.2)
Total Protein: 5.6 g/dL — ABNORMAL LOW (ref 6.5–8.1)

## 2017-10-02 LAB — LACTIC ACID, PLASMA
Lactic Acid, Venous: 2 mmol/L (ref 0.5–1.9)
Lactic Acid, Venous: 2 mmol/L (ref 0.5–1.9)

## 2017-10-02 LAB — PROCALCITONIN: Procalcitonin: >150 ng/mL

## 2017-10-02 LAB — HEPARIN LEVEL (UNFRACTIONATED): Heparin Unfractionated: >2.2 IU/mL — ABNORMAL HIGH (ref 0.30–0.70)

## 2017-10-02 LAB — APTT: aPTT: 66 seconds — ABNORMAL HIGH (ref 24–36)

## 2017-10-02 LAB — MAGNESIUM: Magnesium: 2.1 mg/dL (ref 1.7–2.4)

## 2017-10-02 LAB — PHOSPHORUS: Phosphorus: 2.6 mg/dL (ref 2.5–4.6)

## 2017-10-02 SURGERY — REMOVAL PORT-A-CATH
Anesthesia: General

## 2017-10-02 SURGERY — MINOR REMOVAL PORT-A-CATH
Anesthesia: General

## 2017-10-02 MED ORDER — LACTATED RINGERS IV SOLN
INTRAVENOUS | Status: DC
  Administered 2017-10-02 – 2017-10-03 (×2): via INTRAVENOUS

## 2017-10-02 MED ORDER — SODIUM CHLORIDE 0.9 % IV SOLN
2.0000 g | INTRAVENOUS | Status: DC
  Administered 2017-10-02 – 2017-10-03 (×2): 2 g via INTRAVENOUS
  Filled 2017-10-02 (×3): qty 20

## 2017-10-02 MED ORDER — BUPIVACAINE-EPINEPHRINE (PF) 0.25% -1:200000 IJ SOLN
INTRAMUSCULAR | Status: DC | PRN
  Administered 2017-10-02: 5 mL via PERINEURAL

## 2017-10-02 MED ORDER — LORAZEPAM 2 MG/ML IJ SOLN
INTRAMUSCULAR | Status: AC
  Administered 2017-10-02: 1 mg
  Filled 2017-10-02: qty 1

## 2017-10-02 MED ORDER — PRO-STAT SUGAR FREE PO LIQD: 30.0000 mL | Freq: Two times a day (BID) | ORAL | Status: DC

## 2017-10-02 MED ORDER — HEPARIN (PORCINE) IN NACL 100-0.45 UNIT/ML-% IJ SOLN
1500.0000 [IU]/h | INTRAMUSCULAR | Status: DC
  Administered 2017-10-02 – 2017-10-03 (×2): 1600 [IU]/h via INTRAVENOUS
  Administered 2017-10-03: 1800 [IU]/h via INTRAVENOUS
  Administered 2017-10-04: 1700 [IU]/h via INTRAVENOUS
  Filled 2017-10-02 (×3): qty 250

## 2017-10-02 MED ORDER — MAGNESIUM SULFATE 2 GM/50ML IV SOLN
2.0000 g | Freq: Once | INTRAVENOUS | Status: AC
  Administered 2017-10-02: 2 g via INTRAVENOUS
  Filled 2017-10-02: qty 50

## 2017-10-02 MED ORDER — LIDOCAINE-EPINEPHRINE 1 %-1:100000 IJ SOLN
30.0000 mL | Freq: Once | INTRAMUSCULAR | Status: AC
  Administered 2017-10-02: 5 mL via INTRADERMAL
  Filled 2017-10-02: qty 30

## 2017-10-02 MED ORDER — SODIUM CHLORIDE 0.9 % IV SOLN
1750.0000 mg | INTRAVENOUS | Status: DC
  Administered 2017-10-02 – 2017-10-06 (×5): 1750 mg via INTRAVENOUS
  Filled 2017-10-02 (×7): qty 1750

## 2017-10-02 MED ORDER — VITAL HIGH PROTEIN PO LIQD
1000.0000 mL | ORAL | Status: DC
  Filled 2017-10-02 (×2): qty 1000

## 2017-10-02 MED ORDER — SODIUM CHLORIDE 0.9 % IV SOLN
1.0000 g | Freq: Two times a day (BID) | INTRAVENOUS | Status: DC
  Filled 2017-10-02: qty 1

## 2017-10-02 MED ORDER — VITAL HIGH PROTEIN PO LIQD
1000.0000 mL | ORAL | Status: DC
  Administered 2017-10-03 – 2017-10-04 (×2): 1000 mL
  Filled 2017-10-02 (×3): qty 1000

## 2017-10-02 MED ORDER — HYDROCORTISONE NA SUCCINATE PF 100 MG IJ SOLR
50.0000 mg | Freq: Two times a day (BID) | INTRAMUSCULAR | Status: DC
  Administered 2017-10-02: 50 mg via INTRAVENOUS
  Filled 2017-10-02: qty 2

## 2017-10-02 SURGICAL SUPPLY — 36 items
ADH SKN CLS APL DERMABOND .7 (GAUZE/BANDAGES/DRESSINGS) ×1
DERMABOND ADVANCED (GAUZE/BANDAGES/DRESSINGS) ×1
DERMABOND ADVANCED .7 DNX12 (GAUZE/BANDAGES/DRESSINGS) IMPLANT
DRAPE HALF SHEET 40X57 (DRAPES) ×5 IMPLANT
DRAPE UTILITY XL STRL (DRAPES) ×2 IMPLANT
ELECT CAUTERY BLADE 6.4 (BLADE) ×2 IMPLANT
ELECT REM PT RETURN 9FT ADLT (ELECTROSURGICAL) ×2
ELECTRODE REM PT RTRN 9FT ADLT (ELECTROSURGICAL) ×1 IMPLANT
GAUZE SPONGE 4X4 16PLY XRAY LF (GAUZE/BANDAGES/DRESSINGS) ×2 IMPLANT
GLOVE BIO SURGEON STRL SZ 6 (GLOVE) IMPLANT
GLOVE BIO SURGEON STRL SZ7 (GLOVE) ×1 IMPLANT
GLOVE BIO SURGEON STRL SZ7.5 (GLOVE) ×1 IMPLANT
GLOVE BIO SURGEON STRL SZ8 (GLOVE) IMPLANT
GLOVE BIOGEL PI IND STRL 6.5 (GLOVE) IMPLANT
GLOVE BIOGEL PI IND STRL 7.5 (GLOVE) IMPLANT
GLOVE BIOGEL PI IND STRL 8 (GLOVE) ×1 IMPLANT
GLOVE BIOGEL PI INDICATOR 6.5 (GLOVE)
GLOVE BIOGEL PI INDICATOR 7.5 (GLOVE) ×2
GLOVE BIOGEL PI INDICATOR 8 (GLOVE)
GLOVE ECLIPSE 7.5 STRL STRAW (GLOVE) ×1 IMPLANT
GOWN STRL REUS W/ TWL LRG LVL3 (GOWN DISPOSABLE) ×2 IMPLANT
GOWN STRL REUS W/ TWL XL LVL3 (GOWN DISPOSABLE) IMPLANT
GOWN STRL REUS W/TWL LRG LVL3 (GOWN DISPOSABLE) ×4
GOWN STRL REUS W/TWL XL LVL3 (GOWN DISPOSABLE)
INTRODUCER TRACH BLUE RHINO 6F (TUBING) IMPLANT
INTRODUCER TRACH BLUE RHINO 8F (TUBING) IMPLANT
NS IRRIG 1000ML POUR BTL (IV SOLUTION) ×1 IMPLANT
PENCIL BUTTON HOLSTER BLD 10FT (ELECTRODE) ×2 IMPLANT
SPONGE INTESTINAL PEANUT (DISPOSABLE) ×1 IMPLANT
SUT MNCRL AB 4-0 PS2 18 (SUTURE) ×1 IMPLANT
SUT SILK 2 0 SH CR/8 (SUTURE) ×1 IMPLANT
SUT VIC AB 3-0 SH 18 (SUTURE) ×1 IMPLANT
SUT VICRYL AB 3 0 TIES (SUTURE) ×1 IMPLANT
TOWEL OR 17X24 6PK STRL BLUE (TOWEL DISPOSABLE) ×1 IMPLANT
TUBE CONNECTING 12X1/4 (SUCTIONS) ×1 IMPLANT
YANKAUER SUCT BULB TIP NO VENT (SUCTIONS) ×1 IMPLANT

## 2017-10-02 NOTE — Progress Notes (Addendum)
PULMONARY / CRITICAL CARE MEDICINE   Name: [name] MRN: [id number] DOB: [birth year]    ADMISSION DATE:  09/30/2017 CONSULTATION DATE:  5/1  REFERRING MD:  Dr. Wilson Singer  CHIEF COMPLAINT:  Septic shock   HISTORY OF PRESENT ILLNESS:   HPI obtained from medical chart review as patient is acute encephalopathic.   57 year old male with extensive PMH significant for but not limited to morbid obesity, multiple myeloma on chronic steroids, systolic HF, HTN, GERD, aflutter on Xarelto, CKD stage 3, anemia, DM, chronic foley who presented to [facility] ER with altered mental status.  Patient with fournier's gangrene in 04/2017 and since has frequent hospitalizations with debilitation.  Returned home from rehab in December and since getting home health and PT.    Recent Klebsiella bacteremia from obstructing ureteral stone with ureteral stent placement 3/1 and chronic foley.    Wife reports he is at baseline very coherent and had progressed to walking 100 steps with PT and using walker.  Home health exchanged foley on 4/30 and afterwards reports decline.  Developed altered mental status, decreased PO intake, and fever.   On arrival to ER, patient febrile, minimally responsive, and hypotensive.  Labs noted for WBC 21.4, Hgb 10.6, plts 104, sCr 3.48 (up from 1.54 on 3/15), Na 132, lactic 3.77 with repeat to 3.41.  Received 4.5 L NS and central line placed for vasopressors.  Empirically started on vancomycin and zosyn after blood and urine cultures sent.  Transferred to [facility] for further care.     SUBJECTIVE:  Unable, intubated and sedated Overnight bicarb gtt started for non-gap acidosis Remains on low dose pressors ( Levo at 2) For Port removal at 10 am>> heparin off since 6 am. T Max 101.7 + 8 L since admission  VITAL SIGNS: BP (!) 73/52   Pulse 67   Temp (!) 102 F (38.9 C)   Resp (!) 22   Ht 6' 2" (1.88 m)   Wt (!) 344 lb 12.8 oz (156.4 kg)   SpO2 100%   BMI 44.27 kg/m    HEMODYNAMICS:    VENTILATOR SETTINGS: Vent Mode: PRVC FiO2 (%):  [40 %-50 %] 40 % Set Rate:  [14 bmp-18 bmp] 18 bmp Vt Set:  [633 mL] 660 mL PEEP:  [5 cmH20] 5 cmH20 Plateau Pressure:  [17 cmH20-20 cmH20] 17 cmH20  INTAKE / OUTPUT: I/O last 3 completed shifts: In: 9594.9 [I.V.:3394.9; IV Piggyback:6200] Out: 2535 [Urine:2535]  PHYSICAL EXAMINATION: General:  Sedated, intubated obese male on full vent support HEENT: MM pink and dry, NCAT, ETT secure. PERRLA Neuro: Intubated and sedated , following all extremities CV: S1, S2, Irr, No RMG PULM: Bilateral chest excursion, lungs clear, diminished per bases GI: Obese, ND, NT, BS diminished Extremities: Warm and dry with brisk refill, trace edema  Skin: stage 1 sacral pressure sore, sebaceous cyst below R flank, erythema in thigh and pannicular folds, otherwise warm and dry     LABS:  BMET Recent Labs  Lab 09/30/17 1732 10/01/17 0257 10/02/17 0430  NA 132* 134* 140  K 4.6 4.9 4.2  CL 100* 106 109  CO2 18* 18* 21*  BUN 54* 50* 53*  CREATININE 3.48* 3.44* 2.80*  GLUCOSE 100* 126* 161*    Electrolytes Recent Labs  Lab 09/30/17 1732 10/01/17 0257 10/02/17 0430  CALCIUM 8.3* 7.6* 7.2*  MG  --  1.4*  --   PHOS  --  3.3  3.2  --     CBC Recent Labs  Lab 09/30/17 1732 10/01/17 0257 10/02/17 0430  WBC 21.4* 27.8* 31.6*  HGB 10.6* 10.1* 9.1*  HCT 32.9* 31.0* 27.6*  PLT 104* 113* 107*    Coag's Recent Labs  Lab 09/30/17 1855 10/01/17 0257 10/01/17 1219 10/01/17 1844 10/02/17 0430  APTT  --   --  70* 79* 66*  INR 2.86 2.79  --   --   --     Sepsis Markers Recent Labs  Lab 09/30/17 1939 10/01/17 0257 10/01/17 0300 10/01/17 1219 10/02/17 0430  LATICACIDVEN 3.41*  --  2.6* 1.9  --   PROCALCITON  --  >150.00  --   --  >150.00    ABG Recent Labs  Lab 10/01/17 0442 10/01/17 1817 10/01/17 2357  PHART 7.355 7.223* 7.391  PCO2ART 30.5* 42.9 28.1*  PO2ART 184* 131.0* 187.0*    Liver  Enzymes Recent Labs  Lab 09/30/17 1732 10/01/17 0257 10/02/17 0430  AST 94*  --  50*  ALT 63  --  42  ALKPHOS 64  --  62  BILITOT 0.9  --  0.8  ALBUMIN 2.7* 2.5* 2.3*    Cardiac Enzymes Recent Labs  Lab 10/01/17 1219 10/01/17 2114  TROPONINI 0.16* 0.16*    Glucose Recent Labs  Lab 10/01/17 1145 10/01/17 1536 10/01/17 1924 10/02/17 0000 10/02/17 0318 10/02/17 0814  GLUCAP 139* 141* 144* 144* 147* 154*    Imaging  5/1 CT ABD >> 1. Uncomplicated cholelithiasis. 2. Nonobstructing left lower pole renal calculus. No hydroureteronephrosis. Stable right double pigtail ureteral stent in place with decompressed bladder containing a Foley catheter. 3. Patchy airspace opacities at each lung base, left greater than right compatible with atelectasis. Minimal superimposed pneumonia is not excluded given history of fever and sepsis. 4. Thoracolumbar spondylosis.  CXR 5/1 >> No acute findings.  Stable cardiomegaly and pulmonary vascular congestion. Stable left diaphragmatic hernia. CXR 5/3>> ncreased bibasilar subsegmental atelectasis is noted with pleural effusions.  CULTURES: 5/1 BCx 2 >> 5/1 UC >>   ANTIBIOTICS: 5/1 vancomycin >> 5/1 zosyn >>   SIGNIFICANT EVENTS: 5/1 transfer from [facility] ED to [facility] admitted   LINES/TUBES: Port in Right chest 04/2015 Chronic indwelling foley (last exchanged 4/30) R femoral CVL TL >> 5/1 ETT: 5/2 >>  DISCUSSION: 57 yo M presenting with fever and AMS one day after having chronic foley exchanged.  Transferred from [facility] ER for septic shock   ASSESSMENT / PLAN:  PULMONARY A: At risk for respiratory insufficiency with acute encephalopathy  Sleep apnea - mild pulm vascular congestion on CXR - intubated 5/2 for airway protection due to AMS. P:   Full support until [facility] cath pulled VAP precautions Titrate PEEP and FiO2 as able Sat goals > 92% WUA daily CPAP trials when appropriate>> Weaned well this am for short time Trend  CXR Consider lasix for effusion when off pressors and more hemodynamically stable ABG prn  CARDIOVASCULAR A:  Shock - septic  Hx systolic HF (EF 96-22% in 03/2016), Aflutter on Xarelto, HTN, HLD P:  Tele monitoring Replete mag Continue levophed for map > 65, wean as able Trend lactate Continue solu-cortef 50 mg q 6 Bridge to heparin gtt, hold xarelto ( Off at present to pull line) Hold home torsemide for now  RENAL A:   AKI on CKD -- sCr 3.48 (up from 1.54 on 3/15) R ureteral stent - placed 3/1 Chronic foley  Non - Gap acidosis HypoMag >> repleted 5/3 - no evidence of obstruction or hydronephrosis on CT - s/p 4.5L IVF  P:   Continue Bicarb gtt for now>> Follow Bicarb on BMET BMET 5/3 pm to  Assess need for continued bicarb gtt Hold further IVF given systolic HF and pulm vascular congestion on CXR  Trend BMP/ mag/ phos/ daily wts / urinary output Replace electrolytes as indicated Avoid nephrotoxic agents, ensure adequate renal perfusion  GASTROINTESTINAL A:   No acute issues  P:   NPO while AMS Consider TF SUP   HEMATOLOGIC A:   Anemia- chronic  Thrombocytopenia Elevated INR Coagulated on Xarelto Hx multiple myeloma- previously on maintenance with lenalidomide and dexamethasone - currently off due to recurrent severe infections P:  Trend CBC Bridge to heparin Trend INR  Monitor for bleeding  Type and screen Transfuse for HGB < 7  INFECTIOUS A:   Urosepsis>>  MRSA bacteremia E Coli bacteremia PCT > 150 Lactate  2.0 on 5/3, slight increase last 24 from 1.9 T Max 101.7 P:   Follow BC and UC Trend WBC/ fever curve Continue vanc, Cipro,  Maxipime Narrow as able>> await E Coli sensitivities Trend PCT   ENDOCRINE A:   DM P:   CBG q 4 SSI  Continue stress steroids  NEUROLOGIC A:   Acute encephalopathy related infection Chronic debilitation  P:   Frequent neuro checks Minimize sedation as able Wean precedex as able  PT consult when  appropriate   Pt for Port a cath removal today at 10 am. Heparin off since 6 am. Remains on levo at 2. Plan for line holiday once off pressors. Slight bump in lactate Will check chemistry at 1400 to determine need for continued bicarb gtt.( Started 5/3/early am) Weaned well this am for short period of time  FAMILY  - Updates: No family at bedside  10/02/2017.  Wife, [name] contact info: [phone] or [phone]),  - Inter-disciplinary family meet or Palliative Care meeting due by:  5/9  CCT 35  mins  Magdalen Spatz,  AGACNP-BC [facility] Pgr: [phone] 10/02/2017, 9:09 AM

## 2017-10-02 NOTE — Progress Notes (Addendum)
ANTICOAGULATION CONSULT NOTE - Initial Consult  Pharmacy Consult for heparin Indication: atrial fibrillation  No Known Allergies  Patient Measurements: Height: '6\' 2"'  (188 cm) Weight: (!) 344 lb 12.8 oz (156.4 kg) IBW/kg (Calculated) : 82.2 Heparin Dosing Weight: 118 kg   Vital Signs: Temp: 102 F (38.9 C) (05/03 1200) Temp Source: Axillary (05/03 0338) Pulse Rate: 63 (05/03 1200)  Labs: Recent Labs    09/30/17 1732 09/30/17 1855 10/01/17 0257 10/01/17 1219 10/01/17 1844 10/01/17 2114 10/02/17 0430 10/02/17 0432  HGB 10.6*  --  10.1*  --   --   --  9.1*  --   HCT 32.9*  --  31.0*  --   --   --  27.6*  --   PLT 104*  --  113*  --   --   --  107*  --   APTT  --   --   --  70* 79*  --  66*  --   LABPROT  --  29.8* 29.2*  --   --   --   --   --   INR  --  2.86 2.79  --   --   --   --   --   HEPARINUNFRC  --   --   --  >2.20* >2.20*  --   --  >2.20*  CREATININE 3.48*  --  3.44*  --   --   --  2.80*  --   TROPONINI  --   --   --  0.16*  --  0.16*  --   --     Estimated Creatinine Clearance: 45.5 mL/min (A) (by C-G formula based on SCr of 2.8 mg/dL (H)).   Medical History: Past Medical History:  Diagnosis Date  . Anemia   . Arteriosclerotic cardiovascular disease (ASCVD)    MI-2000s; stent to the proximal LAD and diagonal in 2001; stress nuclear in 2008-impaired exercise capacity, left ventricular dilatation, moderately to severely depressed EF, apical, inferior and anteroseptal scar  . Arthritis   . Atrial flutter ([place])   . 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 Cellulitis of leg    both legs  . Chronic diarrhea   . Chronic kidney disease, stage 3, mod decreased GFR (HCC)    Creatinine of 1.84 in 06/2011 and 1.5 in 07/2011  . Diabetes mellitus    Insulin  . Dysrhythmia    AFlutter  . GERD (gastroesophageal reflux disease)   . Gout   . Hyperlipidemia   . Hypertension   . Injection site reaction   . Multiple myeloma 07/01/2011  . Myocardial infarction ([place]) 2000   . Obesity   . Pedal edema    Venous insufficiency  . Sleep apnea    uses cpap  . Ulcer     Medications:  Medications Prior to Admission  Medication Sig Dispense Refill Last Dose  . acetaminophen (TYLENOL) 500 MG tablet Take 500-1,000 mg by mouth every 6 (six) hours as needed for mild pain or moderate pain.   09/30/2017 at Unknown time  . acyclovir (ZOVIRAX) 400 MG tablet Take 1 tablet (400 mg total) by mouth every morning. 30 tablet 5 09/30/2017 at Unknown time  . allopurinol (ZYLOPRIM) 300 MG tablet TAKE ONE TABLET BY MOUTH ONCE DAILY. 30 tablet 2 09/30/2017 at Unknown time  . Artificial Tear Ointment (DRY EYES OP) Place 1 drop into both eyes as needed (dry eyes).   Past Month at Unknown time  . aspirin EC 81 MG tablet Take 81  mg by mouth daily.   09/30/2017 at Unknown time  . atorvastatin (LIPITOR) 80 MG tablet TAKE ONE TABLET BY MOUTH AT BEDTIME. 90 tablet 3 09/30/2017 at Unknown time  . calcitRIOL (ROCALTROL) 0.25 MCG capsule Take 0.25 mcg by mouth 3 (three) times a week. Monday, Wednesday, Friday.   09/30/2017 at Unknown time  . Calcium Carbonate (CALCIUM 600 PO) Take 1 tablet by mouth 2 (two) times daily.   09/30/2017 at Unknown time  . dexlansoprazole (DEXILANT) 60 MG capsule Take 60 mg by mouth every morning.   09/30/2017 at Unknown time  . dicyclomine (BENTYL) 20 MG tablet TAKE 1 TABLET BY MOUTH BEFORE MEALS 3 TIMES DAILY. 90 tablet 3 09/30/2017 at Unknown time  . Eluxadoline (VIBERZI) 100 MG TABS Take 100 mg 2 (two) times daily with a meal by mouth.    09/30/2017 at Unknown time  . gabapentin (NEURONTIN) 300 MG capsule Take 1 capsule (300 mg total) by mouth 2 (two) times daily. 60 capsule 0 09/30/2017 at Unknown time  . insulin detemir (LEVEMIR) 100 UNIT/ML injection Inject 0.1 mLs (10 Units total) into the skin every morning. 10 mL 11 09/30/2017 at Unknown time  . insulin lispro (HUMALOG) 100 UNIT/ML injection Inject 0-0.1 mLs (0-10 Units total) into the skin 2 (two) times daily with a meal. Sliding  Scale per patient  CBG 150-200: 1 units CBG 201-275:  3 units CBG 275-350: 5 units 351-425: 7 units 425-500: 9 units 500+: 10 units 10 mL 0 09/30/2017 at Unknown time  . loratadine (CLARITIN) 10 MG tablet Take 10 mg by mouth every morning.    09/30/2017 at Unknown time  . Magnesium Cl-Calcium Carbonate (SLOW-MAG PO) Take 1 tablet by mouth every morning.   09/30/2017 at Unknown time  . methocarbamol (ROBAXIN) 500 MG tablet Take 1 tablet (500 mg total) by mouth every 8 (eight) hours as needed for muscle spasms. 15 tablet 0 unknown  . Multiple Vitamins-Minerals (MULTIVITAMINS THER. W/MINERALS) TABS Take 1 tablet by mouth daily.    09/30/2017 at Unknown time  . nitroGLYCERIN (NITROSTAT) 0.4 MG SL tablet DISSOLVE 1 TABLET UNDER TONGUE EVERY 5 MINUTES UP TO 15 MIN FOR CHESTPAIN. IF NO RELIEF CALL 911. 25 tablet 0 unknown  . potassium chloride SA (K-DUR,KLOR-CON) 20 MEQ tablet Take 1 tablet (20 mEq total) by mouth daily. 30 tablet 0 09/30/2017 at Unknown time  . sulfamethoxazole-trimethoprim (BACTRIM DS,SEPTRA DS) 800-160 MG tablet TAKE ONE TABLET BY MOUTH EVERY MONDAY, WEDNESDAY, AND FRIDAY. 12 tablet 0 09/30/2017 at Unknown time  . torsemide (DEMADEX) 20 MG tablet Take 1 tablet (20 mg total) by mouth daily. Take extra 1 tab for weight gain of 3 lbs in 1 day or 5 lbs in 2 days (Patient taking differently: Take 20 mg by mouth 2 (two) times daily. Take extra 1 tab for weight gain of 3 lbs in 1 day or 5 lbs in 2 days) 10 tablet 0 09/30/2017 at 0  . Vitamin D, Ergocalciferol, (DRISDOL) 50000 units CAPS capsule TAKE 1 CAPSULE BY MOUTH EVERY THIRTY DAYS. 3 capsule PRN Past Month at Unknown time  . XARELTO 20 MG TABS tablet TAKE 1 TABLET BY MOUTH DAILY. 30 tablet 6 09/30/2017 at 1030A  . sulfamethoxazole-trimethoprim (BACTRIM DS,SEPTRA DS) 800-160 MG tablet TAKE ONE TABLET BY MOUTH EVERY MONDAY, WEDNESDAY, AND FRIDAY. (Patient not taking: Reported on 09/30/2017) 12 tablet 0 Not Taking at Unknown time  . traMADol (ULTRAM) 50 MG  tablet Take 1 tablet (50 mg total) by mouth every 6 (  six) hours as needed. (Patient not taking: Reported on 09/30/2017) 20 tablet 0 Not Taking at Unknown time    Assessment: 73 YOM here with septic shock to resume IV heparin 4 hours s/p port-a-cath removal. He was on Xarelto pta. AM HL was > 2.2 due to Xarelto pta. Will monitor APTT's for now. H/H down. Plt 107k   Goal of Therapy:  Heparin level 0.3-0.7 units/ml Monitor platelets by anticoagulation protocol: Yes   Plan:  -Restart heparin at 1600 units/hr at 1700 today per surgery instructions -F/u 6 hr aPTT -Monitor daily aPTT/HL, CBC and s/s of bleeding  -Given patient continues to spike fevers and has grown 2/4 Staph aureus (MRSA on BCID), will increase vancomycin dose to 1750 mg IV Q 24 hours. SCr has improved and patient has good UOP.   Albertina Parr, PharmD., BCPS Clinical Pharmacist Clinical phone for 10/02/17 until 3:30pm: [phone] If after 3:30pm, please call main pharmacy at: [phone]

## 2017-10-02 NOTE — Progress Notes (Signed)
EEG complete - results pending 

## 2017-10-02 NOTE — Interval H&P Note (Signed)
History and Physical Interval Note:  10/02/2017 11:37 AM  [name]  has presented today for surgery, with the diagnosis of  Bacteremia.  The various methods of treatment have been discussed with the patient and family. After consideration of risks, benefits and other options for treatment, the patient has consented to  Procedure(s): MINOR REMOVAL PORT-A-CATH AT BEDSIDE (N/A) as a surgical intervention .  The patient's history has been reviewed, patient examined, no change in status, stable for surgery.  I have reviewed the patient's chart and labs.  Questions were answered to the patient's satisfaction.     Maia Petties

## 2017-10-02 NOTE — Progress Notes (Signed)
INFECTIOUS DISEASE PROGRESS NOTE  ID: [name] is a 57 y.o. male with  Active Problems:   Septic shock ([place])  Subjective: Still having fever Pressor has decreased o/n.   Abtx:  Anti-infectives (From admission, onward)   Start     Dose/Rate Route Frequency Ordered Stop   10/03/17 0600  vancomycin (VANCOCIN) 2,000 mg in sodium chloride 0.9 % 500 mL IVPB     2,000 mg 250 mL/hr over 120 Minutes Intravenous Every 48 hours 10/01/17 1352     10/02/17 0600  vancomycin (VANCOCIN) 1,750 mg in sodium chloride 0.9 % 500 mL IVPB  Status:  Discontinued     1,750 mg 250 mL/hr over 120 Minutes Intravenous Every 24 hours 10/01/17 1350 10/01/17 1352   10/01/17 2000  ceFEPIme (MAXIPIME) 2 g in sodium chloride 0.9 % 100 mL IVPB     2 g 200 mL/hr over 30 Minutes Intravenous Every 12 hours 10/01/17 1800     10/01/17 1830  ciprofloxacin (CIPRO) IVPB 400 mg     400 mg 200 mL/hr over 60 Minutes Intravenous Every 12 hours 10/01/17 1800     10/01/17 0600  vancomycin (VANCOCIN) 1,750 mg in sodium chloride 0.9 % 500 mL IVPB  Status:  Discontinued     1,750 mg 250 mL/hr over 120 Minutes Intravenous Every 24 hours 10/01/17 0536 10/01/17 1255   10/01/17 0600  piperacillin-tazobactam (ZOSYN) IVPB 3.375 g  Status:  Discontinued     3.375 g 12.5 mL/hr over 240 Minutes Intravenous Every 8 hours 10/01/17 0536 10/01/17 1736   09/30/17 1815  vancomycin (VANCOCIN) IVPB 1000 mg/200 mL premix     1,000 mg 200 mL/hr over 60 Minutes Intravenous  Once 09/30/17 1802 09/30/17 1928   09/30/17 1815  piperacillin-tazobactam (ZOSYN) IVPB 3.375 g     3.375 g 100 mL/hr over 30 Minutes Intravenous  Once 09/30/17 1802 09/30/17 1847      Medications:  Scheduled: . chlorhexidine  15 mL Mouth Rinse BID  . chlorhexidine gluconate (MEDLINE KIT)  15 mL Mouth Rinse BID  . Chlorhexidine Gluconate Cloth  6 each Topical Q0600  . [START ON 10/07/2017] Chlorhexidine Gluconate Cloth  6 each Topical Daily  . hydrocortisone  sodium succinate  50 mg Intravenous Q6H  . insulin aspart  2-6 Units Subcutaneous Q4H  . lidocaine-EPINEPHrine  30 mL Intradermal Once  . mouth rinse  15 mL Mouth Rinse q12n4p  . mouth rinse  15 mL Mouth Rinse QID  . mupirocin ointment  1 application Nasal BID  . sodium chloride flush  10-40 mL Intracatheter Q12H    Objective: Vital signs in last 24 hours: Temp:  [98.7 F (37.1 C)-102 F (38.9 C)] 102 F (38.9 C) (05/03 0900) Pulse Rate:  [66-95] 66 (05/03 0926) Resp:  [14-34] 24 (05/03 0900) SpO2:  [96 %-100 %] 100 % (05/03 0900) Arterial Line BP: (106-161)/(56-81) 117/59 (05/03 0900) FiO2 (%):  [40 %-50 %] 40 % (05/03 0926) Weight:  [156.4 kg (344 lb 12.8 oz)] 156.4 kg (344 lb 12.8 oz) (05/03 0500)   General appearance: no distress Resp: diminished breath sounds anterior - bilateral Chest wall: no tenderness, R sided port non-tender Cardio: regular rate and rhythm GI: normal findings: soft, non-tender and abnormal findings:  distended and hypoactive bowel sounds  Lab Results Recent Labs    10/01/17 0257 10/02/17 0430  WBC 27.8* 31.6*  HGB 10.1* 9.1*  HCT 31.0* 27.6*  NA 134* 140  K 4.9 4.2  CL 106 109  CO2 18* 21*  BUN 50* 53*  CREATININE 3.44* 2.80*   Liver Panel Recent Labs    09/30/17 1732 10/01/17 0257 10/02/17 0430  PROT 6.1*  --  5.6*  ALBUMIN 2.7* 2.5* 2.3*  AST 94*  --  50*  ALT 63  --  42  ALKPHOS 64  --  62  BILITOT 0.9  --  0.8   Sedimentation Rate No results for input(s): ESRSEDRATE in the last 72 hours. C-Reactive Protein No results for input(s): CRP in the last 72 hours.  Microbiology: Recent Results (from the past 240 hour(s))  Culture, blood (Routine x 2)     Status: None (Preliminary result)   Collection Time: 09/30/17  5:32 PM  Result Value Ref Range Status   Specimen Description   Final    RIGHT ANTECUBITAL Performed at [facility], [address] [place], [place] [address]    Special Requests   Final    BOTTLES DRAWN  AEROBIC AND ANAEROBIC Blood Culture adequate volume Performed at [facility], [address] [place], [place] [address]    Culture  Setup Time   Final    GRAM NEGATIVE RODS ANAEROBIC BOTTLE Gram Stain Report Called to,Read Back By and Verified With: FIELDS S. AT [place] AT 0920A ON [id number] BY THOMPSON S. GRAM POSITIVE COCCI IN CLUSTERS RECOVERED FROM THE AEROBIC BOTTLE Gram Stain Report Called to,Read Back By and Verified With: JOHNSON,J. AT 9562 ON 10/01/2017 BY BAUGHAM,M. Performed at [place].  VALUE IS CONSISTENT WITH PREVIOUSLY REPORTED AND CALLED VALUE. Performed at [facility], [place] [address], [place], [place] [address]    Culture   Final    NO GROWTH 2 DAYS Performed at [facility], [address] [place], [place] [address]    Report Status PENDING  Incomplete  Culture, blood (Routine x 2)     Status: None (Preliminary result)   Collection Time: 09/30/17  5:37 PM  Result Value Ref Range Status   Specimen Description   Final    RIGHT ANTECUBITAL Performed at [facility], [address] [place], [place] [address]    Special Requests   Final    BOTTLES DRAWN AEROBIC AND ANAEROBIC Blood Culture adequate volume Performed at [facility], [address] [place], [place] [address]    Culture  Setup Time   Final    GRAM NEGATIVE RODS ANEROBIC BOTTLE Gram Stain Report Called to,Read Back By and Verified With: FIELDS S. AT [facility] AT 0920A ON [id number] BY THOMPSON S. GRAM POSITIVE COCCI IN CLUSTERS REOVERED FROM THE AEROBIC BOTTLE. Gram Stain Report Called to,Read Back By and Verified With: JOHNSON,J. AT 1258 ON 10/01/2017 BY BAUGHAM,M. Performed at [facility] Organism ID to follow ANAEROBIC BOTTLE ONLY Performed at [facility], [place] [address], [place], [place] [address]    Culture   Final    NO GROWTH 2 DAYS Performed at [facility], [address] [place], [place] [address]    Report Status PENDING  Incomplete  Blood Culture ID  Panel (Reflexed)     Status: Abnormal   Collection Time: 09/30/17  5:37 PM  Result Value Ref Range Status   Enterococcus species NOT DETECTED NOT DETECTED Final   Listeria monocytogenes NOT DETECTED NOT DETECTED Final   Staphylococcus species DETECTED (A) NOT DETECTED Final    Comment: CRITICAL RESULT CALLED TO, READ BACK BY AND VERIFIED WITH: Asencion Islam PharmD 16:05 10/01/17 (wilsonm)    Staphylococcus aureus DETECTED (  A) NOT DETECTED Final    Comment: Methicillin (oxacillin)-resistant Staphylococcus aureus (MRSA). MRSA is predictably resistant to beta-lactam antibiotics (except ceftaroline). Preferred therapy is vancomycin unless clinically contraindicated. Patient requires contact precautions if  hospitalized. CRITICAL RESULT CALLED TO, READ BACK BY AND VERIFIED WITH: Asencion Islam PharmD 16:05 10/01/17 (wilsonm)    Methicillin resistance DETECTED (A) NOT DETECTED Final    Comment: CRITICAL RESULT CALLED TO, READ BACK BY AND VERIFIED WITH: Asencion Islam PharmD 16:05 10/01/17 (wilsonm)    Streptococcus species NOT DETECTED NOT DETECTED Final   Streptococcus agalactiae NOT DETECTED NOT DETECTED Final   Streptococcus pneumoniae NOT DETECTED NOT DETECTED Final   Streptococcus pyogenes NOT DETECTED NOT DETECTED Final   Acinetobacter baumannii NOT DETECTED NOT DETECTED Final   Enterobacteriaceae species DETECTED (A) NOT DETECTED Final    Comment: Enterobacteriaceae represent a large family of gram-negative bacteria, not a single organism. CRITICAL RESULT CALLED TO, READ BACK BY AND VERIFIED WITH: Jacinto Reap Mancheril PharmD 16:05 10/01/17 (wilsonm)    Enterobacter cloacae complex NOT DETECTED NOT DETECTED Final   Escherichia coli DETECTED (A) NOT DETECTED Final    Comment: CRITICAL RESULT CALLED TO, READ BACK BY AND VERIFIED WITH: Asencion Islam PharmD 16:05 10/01/17 (wilsonm)    Klebsiella oxytoca NOT DETECTED NOT DETECTED Final   Klebsiella pneumoniae NOT DETECTED NOT DETECTED Final   Proteus species  NOT DETECTED NOT DETECTED Final   Serratia marcescens NOT DETECTED NOT DETECTED Final   Carbapenem resistance NOT DETECTED NOT DETECTED Final   Haemophilus influenzae NOT DETECTED NOT DETECTED Final   Neisseria meningitidis NOT DETECTED NOT DETECTED Final   Pseudomonas aeruginosa NOT DETECTED NOT DETECTED Final   Candida albicans NOT DETECTED NOT DETECTED Final   Candida glabrata NOT DETECTED NOT DETECTED Final   Candida krusei NOT DETECTED NOT DETECTED Final   Candida parapsilosis NOT DETECTED NOT DETECTED Final   Candida tropicalis NOT DETECTED NOT DETECTED Final    Comment: Performed at [facility], [place] [address], [place], [place] [address]  MRSA PCR Screening     Status: Abnormal   Collection Time: 10/01/17  2:26 AM  Result Value Ref Range Status   MRSA by PCR POSITIVE (A) NEGATIVE Final    Comment:        The GeneXpert MRSA Assay (FDA approved for NASAL specimens only), is one component of a comprehensive MRSA colonization surveillance program. It is not intended to diagnose MRSA infection nor to guide or monitor treatment for MRSA infections. RESULT CALLED TO, READ BACK BY AND VERIFIED WITH: B SHEPARD RN 10/01/17 0438 JDW   Culture, blood (routine x 2)     Status: None (Preliminary result)   Collection Time: 10/02/17  6:00 AM  Result Value Ref Range Status   Specimen Description   Final    BLOOD LEFT HAND Performed at [facility], [place] [address], [place], [place] [address]    Special Requests   Final    BOTTLES DRAWN AEROBIC ONLY Blood Culture adequate volume   Culture PENDING  Incomplete   Report Status PENDING  Incomplete  Culture, blood (routine x 2)     Status: None (Preliminary result)   Collection Time: 10/02/17  6:14 AM  Result Value Ref Range Status   Specimen Description   Final    BLOOD RIGHT HAND Performed at [facility], [place] [address] [place], [place] [address]    Special Requests   Final    BOTTLES DRAWN AEROBIC ONLY Blood  Culture  adequate volume   Culture PENDING  Incomplete   Report Status PENDING  Incomplete    Studies/Results: Ct Abdomen Pelvis Wo Contrast  Result Date: 09/30/2017 CLINICAL DATA:  57 year old male presents with hematuria, sepsis and fever. Hypotension. History of obstructing ureteral stones. EXAM: CT ABDOMEN AND PELVIS WITHOUT CONTRAST TECHNIQUE: Multidetector CT imaging of the abdomen and pelvis was performed following the standard protocol without IV contrast. COMPARISON:  07/30/2017 FINDINGS: Lower chest: New bibasilar pulmonary consolidations likely reflecting atelectasis, left greater than right. Superimposed pneumonia would be difficult to entirely exclude given history of fever and sepsis. Heart size is top normal in size without pericardial effusion. Hepatobiliary: 6 mm dependent gallstone within a non thickened but distended gallbladder. This could be due to a fasting state. The unenhanced liver is unremarkable. Pancreas: No ductal dilatation or mass given limitations of a noncontrast study. Spleen: Normal size spleen. Adrenals/Urinary Tract: Normal bilateral adrenal glands. Right-sided double pigtail ureteral stent is identified with the proximal coil in the right renal pelvis and distal coil in the decompressed urinary bladder. No calculus identified along the course of the ureteral stent. A Foley catheter decompresses the urinary bladder. Left kidney is unremarkable apart from a punctate nonobstructing left renal calculus. Moderate perinephric fat stranding is noted. Stomach/Bowel: Small hiatal hernia. Decompressed stomach with normal small bowel rotation. Normal appendix. Minimal scattered colonic diverticulosis without acute diverticulitis along the left colon. Vascular/Lymphatic: No aortic aneurysm. Aortoiliac and branch vessel atherosclerosis. No aneurysm or adenopathy. Reproductive: Normal size prostate and seminal vesicles. Other: No free air. Small fat containing umbilical hernia. Small  fat containing right inguinal hernia. Musculoskeletal: Thoracolumbar spondylosis with multilevel degenerative disc disease most marked from L2 through S1 in the lumbar spine. Associated facet arthropathy is seen. IMPRESSION: 1. Uncomplicated cholelithiasis. 2. Nonobstructing left lower pole renal calculus. No hydroureteronephrosis. Stable right double pigtail ureteral stent in place with decompressed bladder containing a Foley catheter. 3. Patchy airspace opacities at each lung base, left greater than right compatible with atelectasis. Minimal superimposed pneumonia is not excluded given history of fever and sepsis. 4. Thoracolumbar spondylosis. Electronically Signed   By: Ashley Royalty M.D.   On: 09/30/2017 20:40   Dg Chest Port 1 View  Result Date: 10/02/2017 CLINICAL DATA:  Acute encephalopathy. EXAM: PORTABLE CHEST 1 VIEW COMPARISON:  Radiograph of Oct 01, 2017. FINDINGS: Stable cardiomegaly is noted. Endotracheal and nasogastric tubes are unchanged in position. Right internal jugular Port-A-Cath is unchanged in position. No pneumothorax is noted. Increased bibasilar subsegmental atelectasis is noted with associated pleural effusions. Bony thorax is unremarkable. IMPRESSION: Stable support apparatus. Increased bibasilar subsegmental atelectasis is noted with pleural effusions. Electronically Signed   By: Marijo Conception, M.D.   On: 10/02/2017 07:24   Portable Chest X-ray  Result Date: 10/01/2017 CLINICAL DATA:  Endotracheal tube placement EXAM: PORTABLE CHEST 1 VIEW COMPARISON:  09/30/2017 FINDINGS: 1653 hours. Endotracheal tube tip is 2.3 cm above the base of the carina. The NG tube passes into the stomach although the distal tip position is not included on the film. The cardio pericardial silhouette is enlarged. There is pulmonary vascular congestion without overt pulmonary edema. Left base collapse/consolidation noted. Right Port-A-Cath tip overlies the distal SVC. Bones are diffusely demineralized.  Telemetry leads overlie the chest. IMPRESSION: 1. Endotracheal tube tip is 2.3 cm above the base of the carina. 2. Low lung volumes with cardiomegaly and vascular congestion. 3. Left base collapse/consolidation. Electronically Signed   By: Misty Stanley M.D.   On: 10/01/2017 17:36  Dg Chest Port 1 View  Result Date: 09/30/2017 CLINICAL DATA:  Altered mental status. EXAM: PORTABLE CHEST 1 VIEW COMPARISON:  08/13/2017 FINDINGS: Right-sided power port remains in appropriate position. Low lung volumes are again seen. Rounded opacity in the left lung base is again seen which is consistent with a a left diaphragmatic hernia is as shown on previous CT. Cardiomegaly and pulmonary vascular congestion are stable. No evidence of acute infiltrate or pleural effusion. IMPRESSION: No acute findings. Stable cardiomegaly and pulmonary vascular congestion. Stable left diaphragmatic hernia. Electronically Signed   By: Earle Gell M.D.   On: 09/30/2017 18:41   [place] Ekg Site Rite  Result Date: 10/01/2017 If Site Rite image not attached, placement could not be confirmed due to current cardiac rhythm.    Assessment/Plan: MRSA bacteremia E coli bacteremia Emphysematous pyelitis Multiple Myeloma (in remission since 05-2017 per family)  Total days of antibiotics: 2 vanco/cipro/cefepime  Continues to have fever Port to come out today Appreciate uro eval If possible move/change R femoral line (placed while bacteremic) Repeat BCx pending.  Cr improving though not back to baseline.  If improving, would d/c cipro Await sensi of his E coli.           Bobby Rumpf MD, FACP Infectious Diseases (pager) [phone] [email or web address] 10/02/2017, 9:56 AM  LOS: 2 days

## 2017-10-02 NOTE — Consult Note (Addendum)
NEURO HOSPITALIST CONSULT NOTE   Requestig physician: Dr. Lake Bells   Reason for Consult: Possible seizure versus myoclonus activity   History obtained from: Chart  HPI:                                                                                                                                          [name] is an 57 y.o. male with extensive past medical history which includes morbid obesity, multiple myeloma on steroids, systolic heart failure, hypertension, a flutter on Xarelto, chronic kidney disease stage III, diabetes who presented to any pain ER with altered mental status.  She recently had Klebsiella bacteremia from obstructing ureteral stone with ureteral stent placed on 3/1 and chronic Foley.  Wife had reported that patient at baseline is very coherent but on 4/30 noted decline.  Patient currently is in ICU and intubated.  Today it was noted that patient had a possible seizure activity versus myoclonus.  Left arm had a rhythmic activity however at times patient would have bilateral flexion which would go toward midline.  Patient is status post surgery for right internal jugular vein port removal in [place] to MRSA bacteremia which cause septic shock.  Neurology was asked to see patient secondary to the abnormal movements.  Past Medical History:  Diagnosis Date  . Anemia   . Arteriosclerotic cardiovascular disease (ASCVD)    MI-2000s; stent to the proximal LAD and diagonal in 2001; stress nuclear in 2008-impaired exercise capacity, left ventricular dilatation, moderately to severely depressed EF, apical, inferior and anteroseptal scar  . Arthritis   . Atrial flutter ([place])   . 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 Cellulitis of leg    both legs  . Chronic diarrhea   . Chronic kidney disease, stage 3, mod decreased GFR (HCC)    Creatinine of 1.84 in 06/2011 and 1.5 in 07/2011  . Diabetes mellitus    Insulin  . Dysrhythmia    AFlutter  . GERD  (gastroesophageal reflux disease)   . Gout   . Hyperlipidemia   . Hypertension   . Injection site reaction   . Multiple myeloma 07/01/2011  . Myocardial infarction ([place]) 2000  . Obesity   . Pedal edema    Venous insufficiency  . Sleep apnea    uses cpap  . Ulcer     Past Surgical History:  Procedure Laterality Date  . ABSCESS DRAINAGE     Scrotal  . BIOPSY  01/02/2012   Procedure: BIOPSY;  Surgeon: Rogene Houston, MD;  [facility];  Service: Endoscopy;  Laterality: N/A;  . BONE MARROW BIOPSY  05/13/11  . CARDIAC CATHETERIZATION     cardiac stent  . CARDIOVERSION N/A 10/13/2012   Procedure: CARDIOVERSION;  Surgeon: Yehuda Savannah, MD;  Location:  [facility];  Service: Cardiovascular;  Laterality: N/A;  . CATARACT EXTRACTION W/PHACO Left 02/13/2014   Procedure: CATARACT EXTRACTION PHACO AND INTRAOCULAR LENS PLACEMENT (IOC);  Surgeon: Tonny Branch, MD;  Location: [facility];  Service: Ophthalmology;  Laterality: Left;  CDE:  7.67  . CATARACT EXTRACTION W/PHACO Right 03/02/2014   Procedure: CATARACT EXTRACTION PHACO AND INTRAOCULAR LENS PLACEMENT RIGHT EYE CDE=16.81;  Surgeon: Tonny Branch, MD;  Location: [facility];  Service: Ophthalmology;  Laterality: Right;  . COLONOSCOPY  11/28/2011   Procedure: COLONOSCOPY;  Surgeon: Rogene Houston, MD;  [facility];  Service: Endoscopy;  Laterality: N/A;  930  . CYSTOSCOPY W/ URETERAL STENT PLACEMENT Right 07/31/2017   Procedure: CYSTOSCOPY WITH RETROGRADE PYELOGRAM/URETERAL STENT PLACEMENT;  Surgeon: Cleon Gustin, MD;  Location: [facility];  Service: Urology;  Laterality: Right;  . ESOPHAGOGASTRODUODENOSCOPY  01/02/2012   Procedure: ESOPHAGOGASTRODUODENOSCOPY (EGD);  Surgeon: Rogene Houston, MD;  [facility];  Service: Endoscopy;  Laterality: N/A;  100  . ESOPHAGOGASTRODUODENOSCOPY N/A 09/20/2012   Procedure: ESOPHAGOGASTRODUODENOSCOPY (EGD);  Surgeon: Rogene Houston, MD;  [facility];  Service: Endoscopy;   Laterality: N/A;  . EUS N/A 10/07/2012   Procedure: UPPER ENDOSCOPIC ULTRASOUND (EUS) LINEAR;  Surgeon: Milus Banister, MD;  [facility];  Service: Endoscopy;  Laterality: N/A;  . INCISION AND DRAINAGE ABSCESS N/A 04/14/2017   Procedure: INCISION AND DRAINAGE ABSCESS;  Surgeon: Ceasar Mons, MD;  [facility];  Service: Urology;  Laterality: N/A;  . INCISION AND DRAINAGE ABSCESS ANAL    . IRRIGATION AND DEBRIDEMENT ABSCESS N/A 04/15/2017   Procedure: DEBRIDEMENT SCROTAL WOUND AND DRESSING CHANGE;  Surgeon: Ceasar Mons, MD;  [facility];  Service: Urology;  Laterality: N/A;  . IRRIGATION AND DEBRIDEMENT ABSCESS N/A 04/17/2017   Procedure: IRRIGATION AND DEBRIDEMENT ABSCESS;  Surgeon: Ceasar Mons, MD;  [facility];  Service: Urology;  Laterality: N/A;  RM 3  . LAPAROSCOPIC GASTRIC BANDING  2006   has been removed  . OSTECTOMY Right 04/08/2017   Procedure: OSTECTOMY RIGHT GREAT TOE;  Surgeon: Caprice Beaver, DPM;  Location: [facility];  Service: Podiatry;  Laterality: Right;  . PORT-A-CATH REMOVAL Left 12/07/2012   Procedure: REMOVAL PORT-A-CATH;  Surgeon: Scherry Ran, MD;  Location: [facility];  Service: General;  Laterality: Left;  . PORTACATH PLACEMENT  07/07/2011   Procedure: INSERTION PORT-A-CATH;  Surgeon: Scherry Ran, MD;  [facility];  Service: General;  Laterality: N/A;  . PORTACATH PLACEMENT N/A 12/07/2012   Procedure: INSERTION PORT-A-CATH;  Surgeon: Scherry Ran, MD;  Location: [facility];  Service: General;  Laterality: N/A;  Attempted portacath placement on left and right side  . WOUND DEBRIDEMENT Right 04/08/2017   Procedure: EXCISION ULCERATION RIGHT GREAT TOE;  Surgeon: Caprice Beaver, DPM;  Location: [facility];  Service: Podiatry;  Laterality: Right;  . WRIST SURGERY     Left; removal of bone fragment    Family History  Problem Relation Age of Onset  . Heart disease Mother   . Cancer Mother   .  Diabetes Father   . Arthritis Unknown   . Anesthesia problems Neg Hx   . Hypotension Neg Hx   . Malignant hyperthermia Neg Hx   . Pseudochol deficiency Neg Hx           Social History:  reports that he quit smoking about 16 years ago. His smoking use included cigarettes and cigars. He has a 0.25 pack-year smoking  history. He has never used smokeless tobacco. He reports that he does not drink alcohol or use drugs.  No Known Allergies  MEDICATIONS:                                                                                                                     Scheduled: . [MAR Hold] chlorhexidine  15 mL Mouth Rinse BID  . [MAR Hold] chlorhexidine gluconate (MEDLINE KIT)  15 mL Mouth Rinse BID  . [MAR Hold] Chlorhexidine Gluconate Cloth  6 each Topical Q0600  . [MAR Hold] Chlorhexidine Gluconate Cloth  6 each Topical Daily  . [MAR Hold] hydrocortisone sodium succinate  50 mg Intravenous Q6H  . [MAR Hold] insulin aspart  2-6 Units Subcutaneous Q4H  . [MAR Hold] mouth rinse  15 mL Mouth Rinse q12n4p  . [MAR Hold] mouth rinse  15 mL Mouth Rinse QID  . [MAR Hold] mupirocin ointment  1 application Nasal BID  . [MAR Hold] sodium chloride flush  10-40 mL Intracatheter Q12H   Continuous: . [MAR Hold] sodium chloride    . [MAR Hold] ciprofloxacin Stopped (10/02/17 0906)  . dexmedetomidine (PRECEDEX) IV infusion 0.9 mcg/kg/hr (10/02/17 1405)  . [MAR Hold] famotidine (PEPCID) IV Stopped (10/02/17 1020)  . heparin    . lactated ringers 75 mL/hr at 10/02/17 1310  . [MAR Hold] meropenem (MERREM) IV    . [MAR Hold] norepinephrine (LEVOPHED) Adult infusion Stopped (10/02/17 1045)  . [MAR Hold] vancomycin    . vasopressin (PITRESSIN) infusion - *FOR SHOCK* Stopped (10/01/17 2019)     ROS:                                                                                                                                       History obtained from unobtainable from patient due to mental  status    Blood pressure (!) 73/52, pulse 63, temperature (!) 102 F (38.9 C), resp. rate (!) 24, height '6\' 2"'  (1.88 m), weight (!) 156.4 kg (344 lb 12.8 oz), SpO2 100 %.   General Examination:  Physical Exam  HEENT-  Normocephalic, no lesions, without obvious abnormality.  Normal external eye and conjunctiva.   Abdomen- All 4 quadrants palpated and nontender Extremities- Warm, dry and intact Musculoskeletal-no joint tenderness, deformity or swelling Skin-warm and dry, no hyperpigmentation, vitiligo, or suspicious lesions  Neurological Examination Mental Status: Intubated, following no commands, sedated on Precedex and as needed fentanyl and Versed. Cranial Nerves: II: No blink to threat III,IV, VI: Dull is intact bilaterally pupils equal, round, reactive to light and accommodation V,VII: Face symmetric, facial light touch sensation normal bilaterally  Motor: Noted left arm rhythmic jerking along with intermittent bilateral lower extremity and upper extremity myoclonic-like activity. Sensory: No withdrawal from pain Deep Tendon Reflexes: 2+ and symmetric throughout Plantars: Right: downgoing   Left: downgoing    Lab Results: Basic Metabolic Panel: Recent Labs  Lab 09/30/17 1732 10/01/17 0257 10/02/17 0430  NA 132* 134* 140  K 4.6 4.9 4.2  CL 100* 106 109  CO2 18* 18* 21*  GLUCOSE 100* 126* 161*  BUN 54* 50* 53*  CREATININE 3.48* 3.44* 2.80*  CALCIUM 8.3* 7.6* 7.2*  MG  --  1.4*  --   PHOS  --  3.3  3.2  --     CBC: Recent Labs  Lab 09/30/17 1732 10/01/17 0257 10/02/17 0430  WBC 21.4* 27.8* 31.6*  NEUTROABS 19.2  --   --   HGB 10.6* 10.1* 9.1*  HCT 32.9* 31.0* 27.6*  MCV 93.7 92.8 90.8  PLT 104* 113* 107*    Cardiac Enzymes: Recent Labs  Lab 10/01/17 1219 10/01/17 2114  TROPONINI 0.16* 0.16*    Lipid Panel: No results for input(s): CHOL, TRIG,  HDL, CHOLHDL, VLDL, LDLCALC in the last 168 hours.  Imaging: Ct Abdomen Pelvis Wo Contrast  Result Date: 09/30/2017 CLINICAL DATA:  57 year old male presents with hematuria, sepsis and fever. Hypotension. History of obstructing ureteral stones. EXAM: CT ABDOMEN AND PELVIS WITHOUT CONTRAST TECHNIQUE: Multidetector CT imaging of the abdomen and pelvis was performed following the standard protocol without IV contrast. COMPARISON:  07/30/2017 FINDINGS: Lower chest: New bibasilar pulmonary consolidations likely reflecting atelectasis, left greater than right. Superimposed pneumonia would be difficult to entirely exclude given history of fever and sepsis. Heart size is top normal in size without pericardial effusion. Hepatobiliary: 6 mm dependent gallstone within a non thickened but distended gallbladder. This could be due to a fasting state. The unenhanced liver is unremarkable. Pancreas: No ductal dilatation or mass given limitations of a noncontrast study. Spleen: Normal size spleen. Adrenals/Urinary Tract: Normal bilateral adrenal glands. Right-sided double pigtail ureteral stent is identified with the proximal coil in the right renal pelvis and distal coil in the decompressed urinary bladder. No calculus identified along the course of the ureteral stent. A Foley catheter decompresses the urinary bladder. Left kidney is unremarkable apart from a punctate nonobstructing left renal calculus. Moderate perinephric fat stranding is noted. Stomach/Bowel: Small hiatal hernia. Decompressed stomach with normal small bowel rotation. Normal appendix. Minimal scattered colonic diverticulosis without acute diverticulitis along the left colon. Vascular/Lymphatic: No aortic aneurysm. Aortoiliac and branch vessel atherosclerosis. No aneurysm or adenopathy. Reproductive: Normal size prostate and seminal vesicles. Other: No free air. Small fat containing umbilical hernia. Small fat containing right inguinal hernia. Musculoskeletal:  Thoracolumbar spondylosis with multilevel degenerative disc disease most marked from L2 through S1 in the lumbar spine. Associated facet arthropathy is seen. IMPRESSION: 1. Uncomplicated cholelithiasis. 2. Nonobstructing left lower pole renal calculus. No hydroureteronephrosis. Stable right double pigtail ureteral stent in place with  decompressed bladder containing a Foley catheter. 3. Patchy airspace opacities at each lung base, left greater than right compatible with atelectasis. Minimal superimposed pneumonia is not excluded given history of fever and sepsis. 4. Thoracolumbar spondylosis. Electronically Signed   By: Ashley Royalty M.D.   On: 09/30/2017 20:40   Dg Chest Port 1 View  Result Date: 10/02/2017 CLINICAL DATA:  Acute encephalopathy. EXAM: PORTABLE CHEST 1 VIEW COMPARISON:  Radiograph of Oct 01, 2017. FINDINGS: Stable cardiomegaly is noted. Endotracheal and nasogastric tubes are unchanged in position. Right internal jugular Port-A-Cath is unchanged in position. No pneumothorax is noted. Increased bibasilar subsegmental atelectasis is noted with associated pleural effusions. Bony thorax is unremarkable. IMPRESSION: Stable support apparatus. Increased bibasilar subsegmental atelectasis is noted with pleural effusions. Electronically Signed   By: Marijo Conception, M.D.   On: 10/02/2017 07:24   Portable Chest X-ray  Result Date: 10/01/2017 CLINICAL DATA:  Endotracheal tube placement EXAM: PORTABLE CHEST 1 VIEW COMPARISON:  09/30/2017 FINDINGS: 1653 hours. Endotracheal tube tip is 2.3 cm above the base of the carina. The NG tube passes into the stomach although the distal tip position is not included on the film. The cardio pericardial silhouette is enlarged. There is pulmonary vascular congestion without overt pulmonary edema. Left base collapse/consolidation noted. Right Port-A-Cath tip overlies the distal SVC. Bones are diffusely demineralized. Telemetry leads overlie the chest. IMPRESSION: 1.  Endotracheal tube tip is 2.3 cm above the base of the carina. 2. Low lung volumes with cardiomegaly and vascular congestion. 3. Left base collapse/consolidation. Electronically Signed   By: Misty Stanley M.D.   On: 10/01/2017 17:36   Dg Chest Port 1 View  Result Date: 09/30/2017 CLINICAL DATA:  Altered mental status. EXAM: PORTABLE CHEST 1 VIEW COMPARISON:  08/13/2017 FINDINGS: Right-sided power port remains in appropriate position. Low lung volumes are again seen. Rounded opacity in the left lung base is again seen which is consistent with a a left diaphragmatic hernia is as shown on previous CT. Cardiomegaly and pulmonary vascular congestion are stable. No evidence of acute infiltrate or pleural effusion. IMPRESSION: No acute findings. Stable cardiomegaly and pulmonary vascular congestion. Stable left diaphragmatic hernia. Electronically Signed   By: Earle Gell M.D.   On: 09/30/2017 18:41   [place] Ekg Site Rite  Result Date: 10/01/2017 If Site Rite image not attached, placement could not be confirmed due to current cardiac rhythm.   Assessment and plan per attending neurologist  Etta Quill PA-C [facility] [phone]  10/02/2017, 2:08 PM   Assessment/Plan: 57 year old male with polymyoclonus in the setting of septic shock, renal insufficiency, antibiotic use.  Of the agents that he is on, I would consider both Merrem and fluoroquinolones as possible culprits  1) Consider changing from either cipro or merrem 2) could start keppra if movements continue to be problematic 3) EEG 4) neurology to follow  Roland Rack, MD [facility] [phone]  If 7pm- 7am, please page neurology on call as listed in [place].

## 2017-10-02 NOTE — Procedures (Signed)
History: 57 year old male with plolymyoclonus  Sedation: None  Technique: This is a 21 channel routine scalp EEG performed at the bedside with bipolar and monopolar montages arranged in accordance to the international 10/20 system of electrode placement. One channel was dedicated to EKG recording.    Background: The background consists of diffuse high-voltage generalized irregular delta and theta activities.  There is a poorly formed posterior dominant rhythm achieves a maximal frequency of 7 Hz.  In addition there are frequent discharges with a shifting bifrontal predominance with triphasic morphology.  Photic stimulation: Physiologic driving is not performed  EEG Abnormalities: 1) triphasic waves 2) generalized irregular slow activity 3) slow PDR  Clinical Interpretation: This EEG recorded evidence of a moderate to severe generalized nonspecific cerebral dysfunction (encephalopathy).  The presence of triphasic waves can be suggestive of a toxic metabolic etiology.  There was no seizure or seizure predisposition recorded on this study. Please note that a normal EEG does not preclude the possibility of epilepsy.   Roland Rack, MD [facility] [phone]  If 7pm- 7am, please page neurology on call as listed in [place].

## 2017-10-02 NOTE — H&P (View-Only) (Signed)
[facility] Progress Note     Subjective: CC: septic shock On levo still, off vasopressin. Intubated and on the vent. Heparin stopped at 0600 this AM.   Objective: Vital signs in last 24 hours: Temp:  [98.7 F (37.1 C)-101.7 F (38.7 C)] 101.7 F (38.7 C) (05/03 0718) Pulse Rate:  [67-95] 67 (05/03 0718) Resp:  [14-34] 24 (05/03 0718) SpO2:  [96 %-100 %] 100 % (05/03 0718) Arterial Line BP: (83-161)/(48-81) 121/64 (05/03 0718) FiO2 (%):  [40 %-50 %] 40 % (05/03 0409) Weight:  [156.4 kg (344 lb 12.8 oz)] 156.4 kg (344 lb 12.8 oz) (05/03 0500)    Intake/Output from previous day: 05/02 0701 - 05/03 0700 In: 3529.4 [I.V.:2879.4; IV Piggyback:650] Out: 2535 [Urine:2535] Intake/Output this shift: No intake/output data recorded.  PE: Gen:  Sedated, intubated Card:  Regular rate and rhythm, port palpable in R chest wall Pulm:  Ventilator sounds bilaterally Abd: Soft, non-distended, bowel sounds present, no HSM Skin: warm and dry  Lab Results:  Recent Labs    10/01/17 0257 10/02/17 0430  WBC 27.8* 31.6*  HGB 10.1* 9.1*  HCT 31.0* 27.6*  PLT 113* 107*   BMET Recent Labs    10/01/17 0257 10/02/17 0430  NA 134* 140  K 4.9 4.2  CL 106 109  CO2 18* 21*  GLUCOSE 126* 161*  BUN 50* 53*  CREATININE 3.44* 2.80*  CALCIUM 7.6* 7.2*   PT/INR Recent Labs    09/30/17 1855 10/01/17 0257  LABPROT 29.8* 29.2*  INR 2.86 2.79   CMP     Component Value Date/Time   NA 140 10/02/2017 0430   K 4.2 10/02/2017 0430   CL 109 10/02/2017 0430   CO2 21 (L) 10/02/2017 0430   GLUCOSE 161 (H) 10/02/2017 0430   BUN 53 (H) 10/02/2017 0430   CREATININE 2.80 (H) 10/02/2017 0430   CREATININE 2.24 (H) 10/05/2012 1411   CALCIUM 7.2 (L) 10/02/2017 0430   CALCIUM 8.3 (L) 02/07/2016 1521   PROT 5.6 (L) 10/02/2017 0430   ALBUMIN 2.3 (L) 10/02/2017 0430   AST 50 (H) 10/02/2017 0430   ALT 42 10/02/2017 0430   ALKPHOS 62 10/02/2017 0430   BILITOT 0.8 10/02/2017 0430    GFRNONAA 23 (L) 10/02/2017 0430   GFRAA 27 (L) 10/02/2017 0430   Lipase     Component Value Date/Time   LIPASE 293 (H) 02/25/2015 1427       Studies/Results: Ct Abdomen Pelvis Wo Contrast  Result Date: 09/30/2017 CLINICAL DATA:  57 year old male presents with hematuria, sepsis and fever. Hypotension. History of obstructing ureteral stones. EXAM: CT ABDOMEN AND PELVIS WITHOUT CONTRAST TECHNIQUE: Multidetector CT imaging of the abdomen and pelvis was performed following the standard protocol without IV contrast. COMPARISON:  07/30/2017 FINDINGS: Lower chest: New bibasilar pulmonary consolidations likely reflecting atelectasis, left greater than right. Superimposed pneumonia would be difficult to entirely exclude given history of fever and sepsis. Heart size is top normal in size without pericardial effusion. Hepatobiliary: 6 mm dependent gallstone within a non thickened but distended gallbladder. This could be due to a fasting state. The unenhanced liver is unremarkable. Pancreas: No ductal dilatation or mass given limitations of a noncontrast study. Spleen: Normal size spleen. Adrenals/Urinary Tract: Normal bilateral adrenal glands. Right-sided double pigtail ureteral stent is identified with the proximal coil in the right renal pelvis and distal coil in the decompressed urinary bladder. No calculus identified along the course of the ureteral stent. A Foley catheter decompresses the urinary bladder. Left kidney  is unremarkable apart from a punctate nonobstructing left renal calculus. Moderate perinephric fat stranding is noted. Stomach/Bowel: Small hiatal hernia. Decompressed stomach with normal small bowel rotation. Normal appendix. Minimal scattered colonic diverticulosis without acute diverticulitis along the left colon. Vascular/Lymphatic: No aortic aneurysm. Aortoiliac and branch vessel atherosclerosis. No aneurysm or adenopathy. Reproductive: Normal size prostate and seminal vesicles. Other: No  free air. Small fat containing umbilical hernia. Small fat containing right inguinal hernia. Musculoskeletal: Thoracolumbar spondylosis with multilevel degenerative disc disease most marked from L2 through S1 in the lumbar spine. Associated facet arthropathy is seen. IMPRESSION: 1. Uncomplicated cholelithiasis. 2. Nonobstructing left lower pole renal calculus. No hydroureteronephrosis. Stable right double pigtail ureteral stent in place with decompressed bladder containing a Foley catheter. 3. Patchy airspace opacities at each lung base, left greater than right compatible with atelectasis. Minimal superimposed pneumonia is not excluded given history of fever and sepsis. 4. Thoracolumbar spondylosis. Electronically Signed   By: Ashley Royalty M.D.   On: 09/30/2017 20:40   Dg Chest Port 1 View  Result Date: 10/02/2017 CLINICAL DATA:  Acute encephalopathy. EXAM: PORTABLE CHEST 1 VIEW COMPARISON:  Radiograph of Oct 01, 2017. FINDINGS: Stable cardiomegaly is noted. Endotracheal and nasogastric tubes are unchanged in position. Right internal jugular Port-A-Cath is unchanged in position. No pneumothorax is noted. Increased bibasilar subsegmental atelectasis is noted with associated pleural effusions. Bony thorax is unremarkable. IMPRESSION: Stable support apparatus. Increased bibasilar subsegmental atelectasis is noted with pleural effusions. Electronically Signed   By: Marijo Conception, M.D.   On: 10/02/2017 07:24   Portable Chest X-ray  Result Date: 10/01/2017 CLINICAL DATA:  Endotracheal tube placement EXAM: PORTABLE CHEST 1 VIEW COMPARISON:  09/30/2017 FINDINGS: 1653 hours. Endotracheal tube tip is 2.3 cm above the base of the carina. The NG tube passes into the stomach although the distal tip position is not included on the film. The cardio pericardial silhouette is enlarged. There is pulmonary vascular congestion without overt pulmonary edema. Left base collapse/consolidation noted. Right Port-A-Cath tip overlies  the distal SVC. Bones are diffusely demineralized. Telemetry leads overlie the chest. IMPRESSION: 1. Endotracheal tube tip is 2.3 cm above the base of the carina. 2. Low lung volumes with cardiomegaly and vascular congestion. 3. Left base collapse/consolidation. Electronically Signed   By: Misty Stanley M.D.   On: 10/01/2017 17:36   Dg Chest Port 1 View  Result Date: 09/30/2017 CLINICAL DATA:  Altered mental status. EXAM: PORTABLE CHEST 1 VIEW COMPARISON:  08/13/2017 FINDINGS: Right-sided power port remains in appropriate position. Low lung volumes are again seen. Rounded opacity in the left lung base is again seen which is consistent with a a left diaphragmatic hernia is as shown on previous CT. Cardiomegaly and pulmonary vascular congestion are stable. No evidence of acute infiltrate or pleural effusion. IMPRESSION: No acute findings. Stable cardiomegaly and pulmonary vascular congestion. Stable left diaphragmatic hernia. Electronically Signed   By: Earle Gell M.D.   On: 09/30/2017 18:41   [place] Ekg Site Rite  Result Date: 10/01/2017 If Site Rite image not attached, placement could not be confirmed due to current cardiac rhythm.   Anti-infectives: Anti-infectives (From admission, onward)   Start     Dose/Rate Route Frequency Ordered Stop   10/03/17 0600  vancomycin (VANCOCIN) 2,000 mg in sodium chloride 0.9 % 500 mL IVPB     2,000 mg 250 mL/hr over 120 Minutes Intravenous Every 48 hours 10/01/17 1352     10/02/17 0600  vancomycin (VANCOCIN) 1,750 mg in sodium chloride 0.9 %  500 mL IVPB  Status:  Discontinued     1,750 mg 250 mL/hr over 120 Minutes Intravenous Every 24 hours 10/01/17 1350 10/01/17 1352   10/01/17 2000  ceFEPIme (MAXIPIME) 2 g in sodium chloride 0.9 % 100 mL IVPB     2 g 200 mL/hr over 30 Minutes Intravenous Every 12 hours 10/01/17 1800     10/01/17 1830  ciprofloxacin (CIPRO) IVPB 400 mg     400 mg 200 mL/hr over 60 Minutes Intravenous Every 12 hours 10/01/17 1800     10/01/17  0600  vancomycin (VANCOCIN) 1,750 mg in sodium chloride 0.9 % 500 mL IVPB  Status:  Discontinued     1,750 mg 250 mL/hr over 120 Minutes Intravenous Every 24 hours 10/01/17 0536 10/01/17 1255   10/01/17 0600  piperacillin-tazobactam (ZOSYN) IVPB 3.375 g  Status:  Discontinued     3.375 g 12.5 mL/hr over 240 Minutes Intravenous Every 8 hours 10/01/17 0536 10/01/17 1736   09/30/17 1815  vancomycin (VANCOCIN) IVPB 1000 mg/200 mL premix     1,000 mg 200 mL/hr over 60 Minutes Intravenous  Once 09/30/17 1802 09/30/17 1928   09/30/17 1815  piperacillin-tazobactam (ZOSYN) IVPB 3.375 g     3.375 g 100 mL/hr over 30 Minutes Intravenous  Once 09/30/17 1802 09/30/17 1847       Assessment/Plan Severe septic shock secondary to UTI MRSA bacteremia - blood cxs also growing gram neg rods Acute metabolic encephalopathy AKI on CKD R ureteral stent - placed 3/1 Chronic foley OSA Anemia of chronic disease Mutliple myeloma Thrombocytopenia Diabetes Mellitus Hx of MI 2000  Heparin stopped at 0600 this AM. Plan for bedside port removal around 1000 this AM.     LOS: 2 days    Brigid Re , [facility] 10/02/2017, 7:42 AM Pager: [phone] Consults: [phone] Mon-Fri 7:00 am-4:30 pm Sat-Sun 7:00 am-11:30 am

## 2017-10-02 NOTE — Progress Notes (Signed)
Initial Nutrition Assessment  DOCUMENTATION CODES:   Morbid obesity  INTERVENTION:  Recommend Vital HP @ goal rate of 80 ml/hr (1920 ml/24hrs) + one pro-stat. Total regimen providing 2020 kcal, 183 grams of protein, and 1612 ml H2O meeting 100% of estimated needs.   NUTRITION DIAGNOSIS:   Inadequate oral intake related to inability to eat as evidenced by NPO status.  GOAL:   Provide needs based on ASPEN/SCCM guidelines  MONITOR:   Vent status, Labs, Weight trends, I & O's, Skin  REASON FOR ASSESSMENT:   Ventilator, Consult Enteral/tube feeding initiation and management  ASSESSMENT:   57 y.o. M admitted on 09/30/17 for septic shock secondary to UTI and MRSA intubated 10/01/17 for waxing and waning mental status. Pt PMH of acute metabolic encephalopathy, AKI on CKD stg III, chronic foley, OSA, anemia of chronic disease, multiple myeloma, thrombocytopenia, T2DM, and hx of MI.   Per NP note, considering TF. Per MD note pt with ongoing fever and pressors decreased.  Surgeon removed port due to MRSA infection 10/02/17.   Pt sleeping on vent. No family at bedside. Unable to get nutrition or weight history.   Spoke with MD, pt not extubated today due to seizure activity; TF consult was placed.   MAP: 70  MVe: 14.9  Temp (24hrs), Avg:101.1 F (38.4 C), Min:98.7 F (37.1 C), Max:102.4 F (39.1 C)  Medications reviewed: solu-cortef, novolog 2-6 units, precedex, levophed, sodium bicarbonate 150 mEq 125 ml/hr, fentanyl, versed (for procedure).   Labs reviewed: K+ 4.2 (WNL), CO2 21 (L), BG 161 (H), BUN 53 (H), creatinine 2.80 (H), AST 50 (H), GFR 23 (L), WBC 31.6 (H), RBC 3.04 (L), hemoglobin 9.1 (L), HCT 27.6 (L), venous lactic acid 2 (H), PTT 66 (H).    Intake/Output Summary (Last 24 hours) at 10/02/2017 1433 Last data filed at 10/02/2017 1400 Gross per 24 hour  Intake 4000.02 ml  Output 2685 ml  Net 1315.02 ml   Net I/O since admit: + 9 L  NUTRITION - FOCUSED PHYSICAL EXAM:    Most Recent Value  Orbital Region  No depletion  Upper Arm Region  No depletion  Thoracic and Lumbar Region  No depletion  Buccal Region  No depletion  Temple Region  Mild depletion  Clavicle Bone Region  No depletion  Clavicle and Acromion Bone Region  No depletion  Scapular Bone Region  Unable to assess  Dorsal Hand  No depletion  Patellar Region  No depletion  Anterior Thigh Region  No depletion  Posterior Calf Region  No depletion  Edema (RD Assessment)  None  Hair  Reviewed  Eyes  Unable to assess  Mouth  Unable to assess  Skin  Reviewed  Nails  Reviewed       Diet Order:   Diet Order           Diet NPO time specified  Diet effective now          EDUCATION NEEDS:   Not appropriate for education at this time  Skin:  Skin Assessment: Skin Integrity Issues: Skin Integrity Issues:: Other (Comment) Other: Moisture associated skin damage - groin/abdomen, cellulitis - R/L ankle  Last BM:  unknown  Height:   Ht Readings from Last 1 Encounters:  10/01/17 '6\' 2"'  (1.88 m)    Weight:   Wt Readings from Last 1 Encounters:  10/02/17 (!) 344 lb 12.8 oz (156.4 kg)    Ideal Body Weight:  86.36 kg  BMI:  Body mass index is 44.27 kg/m.  Estimated Nutritional Needs:   Kcal:  1800-2200 kcal (11-14 kcal/ABW)  Protein:  175-190 grams  Fluid:  1 L + UOP or per MD    Hope Budds, Dietetic Intern

## 2017-10-02 NOTE — Op Note (Signed)
Preop diagnosis: MRSA bacteremia Postop diagnosis: Same Procedure performed: Right internal jugular vein port removal Surgeon:Harl Wiechmann K Nelva Hauk Anesthesia: Local Indications: This is a 57 year old male with multiple myeloma and several other medical issues who presents with sepsis.  Blood cultures are growing E. coli as well as MRSA.  We are asked to remove his port.  The patient is intubated and sedated on the ventilator in the intensive care unit.  We are performing this procedure at the bedside to prevent the need for transport.  Description of procedure: The patient is in a supine position on his ICU bed.  He is sedated which was supplemented with 2 mg of Versed.  The right chest port site is exposed.  We prepped this with ChloraPrep and draped in sterile fashion.  A timeout was taken to ensure the proper patient and proper procedure.  We infiltrated the area over the port with 1% lidocaine with epinephrine.  A transverse incision was made over the port.  We dissected down to the port and we found the hope of the port.  We grasped the catheter removed from the internal jugular vein.  Pressure was held over the right internal jugular vein for several minutes.  No hematoma was noted.  The port was removed by removing the 2 sutures.  There is no purulence or inflammation noted in this area.  We inspected for hemostasis.  The wound was closed with 3-0 Vicryl and 4-0 Monocryl.  Dermabond was used to seal the skin.  The patient tolerated the procedure well.  Imogene Burn. Georgette Dover, MD, [facility]  General/ Trauma Surgery  10/02/2017 11:41 AM

## 2017-10-02 NOTE — Progress Notes (Signed)
Pharmacy Antibiotic Note  [name] is a 56 y.o. male admitted on 09/30/2017 with MRSA and e-coli bacteremia.  Pharmacy has been consulted for merrem/cipro/vanc dosing. WBC up to 31, PCT >150, LA elevated, and febrile.   Pt was on cefepime earlier, but after speaking with Dr. Johnnye Sima, we will change the cefepime to merrem. Urine from 5/1 grew GNRs, blood on 5/1 grew MRSA and e-coli. Sensitivities pending.  Plan: Ciprofloxacin 400 mg IV Q12hrs Merrem 1g IV Q12hrs Vancomycin 2,000 mg IV Q48hrs  Follow cultures, clinical status, LOT Monitor renal function for dose adjustments as needed VT as indicated   Height: 6\' 2"  (188 cm) Weight: (!) 344 lb 12.8 oz (156.4 kg) IBW/kg (Calculated) : 82.2  Temp (24hrs), Avg:101 F (38.3 C), Min:98.7 F (37.1 C), Max:102.4 F (39.1 C)  Recent Labs  Lab 09/30/17 1732 09/30/17 1750 09/30/17 1939 10/01/17 0257 10/01/17 0300 10/01/17 1219 10/02/17 0430 10/02/17 1016  WBC 21.4*  --   --  27.8*  --   --  31.6*  --   CREATININE 3.48*  --   --  3.44*  --   --  2.80*  --   LATICACIDVEN  --  3.77* 3.41*  --  2.6* 1.9  --  2.0*    Estimated Creatinine Clearance: 45.5 mL/min (A) (by C-G formula based on SCr of 2.8 mg/dL (H)).    No Known Allergies  Antimicrobials this admission: Vanc 5/1>> Zosyn 5/1>> 5/2 Cefepime 5/2>>5/3 Cipro 5/2>> Merrem 5/3>>  Dose adjustments this admission: none  Microbiology results: 5/1 Blood x 2 - GNR in anerobic bottle, GPC in clusters in aerobic bottle  5/1 BCID - Staph aureus, resistance detected, and E coli 5/1 urine - sent 5/1 MRSA PCR: positive  Thank you for allowing pharmacy to be a part of this patient's care.   Patterson Hammersmith PharmD PGY1 Pharmacy Practice Resident 10/02/2017 11:59 AM Pager: [phone] Phone: [phone]

## 2017-10-02 NOTE — Progress Notes (Signed)
[facility] Progress Note     Subjective: CC: septic shock On levo still, off vasopressin. Intubated and on the vent. Heparin stopped at 0600 this AM.   Objective: Vital signs in last 24 hours: Temp:  [98.7 F (37.1 C)-101.7 F (38.7 C)] 101.7 F (38.7 C) (05/03 0718) Pulse Rate:  [67-95] 67 (05/03 0718) Resp:  [14-34] 24 (05/03 0718) SpO2:  [96 %-100 %] 100 % (05/03 0718) Arterial Line BP: (83-161)/(48-81) 121/64 (05/03 0718) FiO2 (%):  [40 %-50 %] 40 % (05/03 0409) Weight:  [156.4 kg (344 lb 12.8 oz)] 156.4 kg (344 lb 12.8 oz) (05/03 0500)    Intake/Output from previous day: 05/02 0701 - 05/03 0700 In: 3529.4 [I.V.:2879.4; IV Piggyback:650] Out: 2535 [Urine:2535] Intake/Output this shift: No intake/output data recorded.  PE: Gen:  Sedated, intubated Card:  Regular rate and rhythm, port palpable in R chest wall Pulm:  Ventilator sounds bilaterally Abd: Soft, non-distended, bowel sounds present, no HSM Skin: warm and dry  Lab Results:  Recent Labs    10/01/17 0257 10/02/17 0430  WBC 27.8* 31.6*  HGB 10.1* 9.1*  HCT 31.0* 27.6*  PLT 113* 107*   BMET Recent Labs    10/01/17 0257 10/02/17 0430  NA 134* 140  K 4.9 4.2  CL 106 109  CO2 18* 21*  GLUCOSE 126* 161*  BUN 50* 53*  CREATININE 3.44* 2.80*  CALCIUM 7.6* 7.2*   PT/INR Recent Labs    09/30/17 1855 10/01/17 0257  LABPROT 29.8* 29.2*  INR 2.86 2.79   CMP     Component Value Date/Time   NA 140 10/02/2017 0430   K 4.2 10/02/2017 0430   CL 109 10/02/2017 0430   CO2 21 (L) 10/02/2017 0430   GLUCOSE 161 (H) 10/02/2017 0430   BUN 53 (H) 10/02/2017 0430   CREATININE 2.80 (H) 10/02/2017 0430   CREATININE 2.24 (H) 10/05/2012 1411   CALCIUM 7.2 (L) 10/02/2017 0430   CALCIUM 8.3 (L) 02/07/2016 1521   PROT 5.6 (L) 10/02/2017 0430   ALBUMIN 2.3 (L) 10/02/2017 0430   AST 50 (H) 10/02/2017 0430   ALT 42 10/02/2017 0430   ALKPHOS 62 10/02/2017 0430   BILITOT 0.8 10/02/2017 0430    GFRNONAA 23 (L) 10/02/2017 0430   GFRAA 27 (L) 10/02/2017 0430   Lipase     Component Value Date/Time   LIPASE 293 (H) 02/25/2015 1427       Studies/Results: Ct Abdomen Pelvis Wo Contrast  Result Date: 09/30/2017 CLINICAL DATA:  57 year old male presents with hematuria, sepsis and fever. Hypotension. History of obstructing ureteral stones. EXAM: CT ABDOMEN AND PELVIS WITHOUT CONTRAST TECHNIQUE: Multidetector CT imaging of the abdomen and pelvis was performed following the standard protocol without IV contrast. COMPARISON:  07/30/2017 FINDINGS: Lower chest: New bibasilar pulmonary consolidations likely reflecting atelectasis, left greater than right. Superimposed pneumonia would be difficult to entirely exclude given history of fever and sepsis. Heart size is top normal in size without pericardial effusion. Hepatobiliary: 6 mm dependent gallstone within a non thickened but distended gallbladder. This could be due to a fasting state. The unenhanced liver is unremarkable. Pancreas: No ductal dilatation or mass given limitations of a noncontrast study. Spleen: Normal size spleen. Adrenals/Urinary Tract: Normal bilateral adrenal glands. Right-sided double pigtail ureteral stent is identified with the proximal coil in the right renal pelvis and distal coil in the decompressed urinary bladder. No calculus identified along the course of the ureteral stent. A Foley catheter decompresses the urinary bladder. Left kidney  is unremarkable apart from a punctate nonobstructing left renal calculus. Moderate perinephric fat stranding is noted. Stomach/Bowel: Small hiatal hernia. Decompressed stomach with normal small bowel rotation. Normal appendix. Minimal scattered colonic diverticulosis without acute diverticulitis along the left colon. Vascular/Lymphatic: No aortic aneurysm. Aortoiliac and branch vessel atherosclerosis. No aneurysm or adenopathy. Reproductive: Normal size prostate and seminal vesicles. Other: No  free air. Small fat containing umbilical hernia. Small fat containing right inguinal hernia. Musculoskeletal: Thoracolumbar spondylosis with multilevel degenerative disc disease most marked from L2 through S1 in the lumbar spine. Associated facet arthropathy is seen. IMPRESSION: 1. Uncomplicated cholelithiasis. 2. Nonobstructing left lower pole renal calculus. No hydroureteronephrosis. Stable right double pigtail ureteral stent in place with decompressed bladder containing a Foley catheter. 3. Patchy airspace opacities at each lung base, left greater than right compatible with atelectasis. Minimal superimposed pneumonia is not excluded given history of fever and sepsis. 4. Thoracolumbar spondylosis. Electronically Signed   By: Ashley Royalty M.D.   On: 09/30/2017 20:40   Dg Chest Port 1 View  Result Date: 10/02/2017 CLINICAL DATA:  Acute encephalopathy. EXAM: PORTABLE CHEST 1 VIEW COMPARISON:  Radiograph of Oct 01, 2017. FINDINGS: Stable cardiomegaly is noted. Endotracheal and nasogastric tubes are unchanged in position. Right internal jugular Port-A-Cath is unchanged in position. No pneumothorax is noted. Increased bibasilar subsegmental atelectasis is noted with associated pleural effusions. Bony thorax is unremarkable. IMPRESSION: Stable support apparatus. Increased bibasilar subsegmental atelectasis is noted with pleural effusions. Electronically Signed   By: Marijo Conception, M.D.   On: 10/02/2017 07:24   Portable Chest X-ray  Result Date: 10/01/2017 CLINICAL DATA:  Endotracheal tube placement EXAM: PORTABLE CHEST 1 VIEW COMPARISON:  09/30/2017 FINDINGS: 1653 hours. Endotracheal tube tip is 2.3 cm above the base of the carina. The NG tube passes into the stomach although the distal tip position is not included on the film. The cardio pericardial silhouette is enlarged. There is pulmonary vascular congestion without overt pulmonary edema. Left base collapse/consolidation noted. Right Port-A-Cath tip overlies  the distal SVC. Bones are diffusely demineralized. Telemetry leads overlie the chest. IMPRESSION: 1. Endotracheal tube tip is 2.3 cm above the base of the carina. 2. Low lung volumes with cardiomegaly and vascular congestion. 3. Left base collapse/consolidation. Electronically Signed   By: Misty Stanley M.D.   On: 10/01/2017 17:36   Dg Chest Port 1 View  Result Date: 09/30/2017 CLINICAL DATA:  Altered mental status. EXAM: PORTABLE CHEST 1 VIEW COMPARISON:  08/13/2017 FINDINGS: Right-sided power port remains in appropriate position. Low lung volumes are again seen. Rounded opacity in the left lung base is again seen which is consistent with a a left diaphragmatic hernia is as shown on previous CT. Cardiomegaly and pulmonary vascular congestion are stable. No evidence of acute infiltrate or pleural effusion. IMPRESSION: No acute findings. Stable cardiomegaly and pulmonary vascular congestion. Stable left diaphragmatic hernia. Electronically Signed   By: Earle Gell M.D.   On: 09/30/2017 18:41   [place] Ekg Site Rite  Result Date: 10/01/2017 If Site Rite image not attached, placement could not be confirmed due to current cardiac rhythm.   Anti-infectives: Anti-infectives (From admission, onward)   Start     Dose/Rate Route Frequency Ordered Stop   10/03/17 0600  vancomycin (VANCOCIN) 2,000 mg in sodium chloride 0.9 % 500 mL IVPB     2,000 mg 250 mL/hr over 120 Minutes Intravenous Every 48 hours 10/01/17 1352     10/02/17 0600  vancomycin (VANCOCIN) 1,750 mg in sodium chloride 0.9 %  500 mL IVPB  Status:  Discontinued     1,750 mg 250 mL/hr over 120 Minutes Intravenous Every 24 hours 10/01/17 1350 10/01/17 1352   10/01/17 2000  ceFEPIme (MAXIPIME) 2 g in sodium chloride 0.9 % 100 mL IVPB     2 g 200 mL/hr over 30 Minutes Intravenous Every 12 hours 10/01/17 1800     10/01/17 1830  ciprofloxacin (CIPRO) IVPB 400 mg     400 mg 200 mL/hr over 60 Minutes Intravenous Every 12 hours 10/01/17 1800     10/01/17  0600  vancomycin (VANCOCIN) 1,750 mg in sodium chloride 0.9 % 500 mL IVPB  Status:  Discontinued     1,750 mg 250 mL/hr over 120 Minutes Intravenous Every 24 hours 10/01/17 0536 10/01/17 1255   10/01/17 0600  piperacillin-tazobactam (ZOSYN) IVPB 3.375 g  Status:  Discontinued     3.375 g 12.5 mL/hr over 240 Minutes Intravenous Every 8 hours 10/01/17 0536 10/01/17 1736   09/30/17 1815  vancomycin (VANCOCIN) IVPB 1000 mg/200 mL premix     1,000 mg 200 mL/hr over 60 Minutes Intravenous  Once 09/30/17 1802 09/30/17 1928   09/30/17 1815  piperacillin-tazobactam (ZOSYN) IVPB 3.375 g     3.375 g 100 mL/hr over 30 Minutes Intravenous  Once 09/30/17 1802 09/30/17 1847       Assessment/Plan Severe septic shock secondary to UTI MRSA bacteremia - blood cxs also growing gram neg rods Acute metabolic encephalopathy AKI on CKD R ureteral stent - placed 3/1 Chronic foley OSA Anemia of chronic disease Mutliple myeloma Thrombocytopenia Diabetes Mellitus Hx of MI 2000  Heparin stopped at 0600 this AM. Plan for bedside port removal around 1000 this AM.     LOS: 2 days    Brigid Re , [facility] 10/02/2017, 7:42 AM Pager: [phone] Consults: [phone] Mon-Fri 7:00 am-4:30 pm Sat-Sun 7:00 am-11:30 am

## 2017-10-03 ENCOUNTER — Inpatient Hospital Stay (HOSPITAL_COMMUNITY): Payer: BLUE CROSS/BLUE SHIELD

## 2017-10-03 DIAGNOSIS — R7881 Bacteremia: Secondary | ICD-10-CM | POA: Diagnosis present

## 2017-10-03 DIAGNOSIS — B9562 Methicillin resistant Staphylococcus aureus infection as the cause of diseases classified elsewhere: Secondary | ICD-10-CM | POA: Diagnosis present

## 2017-10-03 DIAGNOSIS — B9620 Unspecified Escherichia coli [E. coli] as the cause of diseases classified elsewhere: Secondary | ICD-10-CM | POA: Diagnosis present

## 2017-10-03 DIAGNOSIS — J9601 Acute respiratory failure with hypoxia: Secondary | ICD-10-CM

## 2017-10-03 LAB — BASIC METABOLIC PANEL
Anion gap: 11 (ref 5–15)
BUN: 56 mg/dL — ABNORMAL HIGH (ref 6–20)
CO2: 19 mmol/L — ABNORMAL LOW (ref 22–32)
Calcium: 7.3 mg/dL — ABNORMAL LOW (ref 8.9–10.3)
Chloride: 111 mmol/L (ref 101–111)
Creatinine, Ser: 2.18 mg/dL — ABNORMAL HIGH (ref 0.61–1.24)
GFR calc Af Amer: 37 mL/min — ABNORMAL LOW (ref >60)
GFR calc non Af Amer: 32 mL/min — ABNORMAL LOW (ref >60)
Glucose, Bld: 207 mg/dL — ABNORMAL HIGH (ref 65–99)
Potassium: 3.7 mmol/L (ref 3.5–5.1)
Sodium: 141 mmol/L (ref 135–145)

## 2017-10-03 LAB — PROTIME-INR
INR: 1.55
Prothrombin Time: 18.4 seconds — ABNORMAL HIGH (ref 11.4–15.2)

## 2017-10-03 LAB — APTT
aPTT: 52 seconds — ABNORMAL HIGH (ref 24–36)
aPTT: 37 seconds — ABNORMAL HIGH (ref 24–36)

## 2017-10-03 LAB — GLUCOSE, CAPILLARY
Glucose-Capillary: 173 mg/dL — ABNORMAL HIGH (ref 65–99)
Glucose-Capillary: 179 mg/dL — ABNORMAL HIGH (ref 65–99)
Glucose-Capillary: 188 mg/dL — ABNORMAL HIGH (ref 65–99)
Glucose-Capillary: 190 mg/dL — ABNORMAL HIGH (ref 65–99)
Glucose-Capillary: 208 mg/dL — ABNORMAL HIGH (ref 65–99)
Glucose-Capillary: 219 mg/dL — ABNORMAL HIGH (ref 65–99)

## 2017-10-03 LAB — TROPONIN I
Troponin I: 0.17 ng/mL (ref <0.03)
Troponin I: 0.16 ng/mL (ref <0.03)
Troponin I: 0.15 ng/mL (ref <0.03)

## 2017-10-03 LAB — HEPARIN LEVEL (UNFRACTIONATED)
Heparin Unfractionated: 0.83 IU/mL — ABNORMAL HIGH (ref 0.30–0.70)
Heparin Unfractionated: 0.47 IU/mL (ref 0.30–0.70)

## 2017-10-03 LAB — CBC
HCT: 26.5 % — ABNORMAL LOW (ref 39.0–52.0)
Hemoglobin: 9.1 g/dL — ABNORMAL LOW (ref 13.0–17.0)
MCH: 30.6 pg (ref 26.0–34.0)
MCHC: 34.3 g/dL (ref 30.0–36.0)
MCV: 89.2 fL (ref 78.0–100.0)
Platelets: 73 10*3/uL — ABNORMAL LOW (ref 150–400)
RBC: 2.97 MIL/uL — ABNORMAL LOW (ref 4.22–5.81)
RDW: 17.4 % — ABNORMAL HIGH (ref 11.5–15.5)
WBC: 33 10*3/uL — ABNORMAL HIGH (ref 4.0–10.5)

## 2017-10-03 LAB — CULTURE, BLOOD (ROUTINE X 2): Special Requests: ADEQUATE

## 2017-10-03 LAB — AMMONIA: Ammonia: 23 umol/L (ref 9–35)

## 2017-10-03 LAB — PROCALCITONIN: Procalcitonin: 134.89 ng/mL

## 2017-10-03 LAB — MAGNESIUM: Magnesium: 2.2 mg/dL (ref 1.7–2.4)

## 2017-10-03 LAB — PHOSPHORUS: Phosphorus: 2.7 mg/dL (ref 2.5–4.6)

## 2017-10-03 MED ORDER — POTASSIUM CHLORIDE 20 MEQ/15ML (10%) PO SOLN
40.0000 meq | Freq: Once | ORAL | Status: AC
  Administered 2017-10-03: 40 meq
  Filled 2017-10-03: qty 30

## 2017-10-03 MED ORDER — FUROSEMIDE 10 MG/ML IJ SOLN
40.0000 mg | Freq: Once | INTRAMUSCULAR | Status: AC
  Administered 2017-10-03: 40 mg via INTRAVENOUS
  Filled 2017-10-03: qty 4

## 2017-10-03 MED ORDER — INSULIN DETEMIR 100 UNIT/ML ~~LOC~~ SOLN
10.0000 [IU] | Freq: Every day | SUBCUTANEOUS | Status: DC
Start: 2017-10-03 — End: 2017-10-08
  Administered 2017-10-03 – 2017-10-08 (×6): 10 [IU] via SUBCUTANEOUS
  Filled 2017-10-03 (×7): qty 0.1

## 2017-10-03 MED ORDER — ORAL CARE MOUTH RINSE
15.0000 mL | OROMUCOSAL | Status: DC
  Administered 2017-10-03 – 2017-10-04 (×11): 15 mL via OROMUCOSAL

## 2017-10-03 MED ORDER — CHLORHEXIDINE GLUCONATE 0.12% ORAL RINSE (MEDLINE KIT)
15.0000 mL | Freq: Two times a day (BID) | OROMUCOSAL | Status: DC
  Administered 2017-10-03 – 2017-10-04 (×3): 15 mL via OROMUCOSAL

## 2017-10-03 MED ORDER — INSULIN ASPART 100 UNIT/ML ~~LOC~~ SOLN
0.0000 [IU] | SUBCUTANEOUS | Status: DC
  Administered 2017-10-03: 4 [IU] via SUBCUTANEOUS
  Administered 2017-10-03: 7 [IU] via SUBCUTANEOUS
  Administered 2017-10-03: 4 [IU] via SUBCUTANEOUS
  Administered 2017-10-03 – 2017-10-04 (×2): 7 [IU] via SUBCUTANEOUS
  Administered 2017-10-04 (×2): 4 [IU] via SUBCUTANEOUS
  Administered 2017-10-04: 7 [IU] via SUBCUTANEOUS

## 2017-10-03 MED ORDER — HYDROCORTISONE NA SUCCINATE PF 100 MG IJ SOLR
50.0000 mg | Freq: Two times a day (BID) | INTRAMUSCULAR | Status: AC
  Administered 2017-10-03 (×2): 50 mg via INTRAVENOUS
  Filled 2017-10-03 (×2): qty 2

## 2017-10-03 NOTE — Progress Notes (Signed)
ANTICOAGULATION CONSULT NOTE - Follow Up Consult  Pharmacy Consult for heparin Indication: atrial fibrillation  Labs: Recent Labs    09/30/17 1855 10/01/17 0257  10/01/17 1219 10/01/17 1844 10/01/17 2114 10/02/17 0430 10/02/17 0432 10/02/17 1334 10/03/17 0228  HGB  --  10.1*  --   --   --   --  9.1*  --   --  9.1*  HCT  --  31.0*  --   --   --   --  27.6*  --   --  26.5*  PLT  --  113*  --   --   --   --  107*  --   --  PENDING  APTT  --   --    < > 70* 79*  --  66*  --   --  52*  LABPROT 29.8* 29.2*  --   --   --   --   --   --   --  18.4*  INR 2.86 2.79  --   --   --   --   --   --   --  1.55  HEPARINUNFRC  --   --    < > >2.20* >2.20*  --   --  >2.20*  --  0.83*  CREATININE  --  3.44*  --   --   --   --  2.80*  --  2.41* 2.18*  TROPONINI  --   --   --  0.16*  --  0.16*  --   --   --   --    < > = values in this interval not displayed.    Assessment: 57yo male subtherapeutic on heparin after resumed.  Goal of Therapy:  aPTT 66-102 seconds   Plan:  Will increase heparin gtt by 1-2 units/kg/hr to 1800 units/hr and check PTT in 6 hours.    [name], PharmD, BCPS  10/03/2017,3:16 AM

## 2017-10-03 NOTE — Progress Notes (Addendum)
PULMONARY / CRITICAL CARE MEDICINE   Name: [name] MRN: [id number] DOB: Sep 12, 1960    ADMISSION DATE:  09/30/2017 CONSULTATION DATE:  5/1  REFERRING MD:  Dr. Wilson Singer  CHIEF COMPLAINT:  Septic shock   BRIEF SUMMARY: 57 year old male with extensive PMH significant for but not limited to morbid obesity, multiple myeloma on chronic steroids, systolic HF, HTN, GERD, aflutter on Xarelto, CKD stage 3, anemia, DM, chronic foley who presented to [facility] ER with altered mental status.  Patient with fournier's gangrene in 04/2017 and since has frequent hospitalizations with debilitation.  Returned home from rehab in December and since getting home health and PT.  Recent Klebsiella bacteremia from obstructing ureteral stone with ureteral stent placement 3/1 and chronic foley.    Wife reports he is at baseline very coherent and had progressed to walking 100 steps with PT and using walker.  Home health exchanged foley on 4/30 and afterwards reports decline.  Developed altered mental status, decreased PO intake, and fever.   On arrival to ER, patient febrile, minimally responsive, and hypotensive.  Labs noted for WBC 21.4, Hgb 10.6, plts 104, sCr 3.48 (up from 1.54 on 3/15), Na 132, lactic 3.77 with repeat to 3.41.  Received 4.5 L NS and central line placed for vasopressors.  Empirically started on vancomycin and zosyn after blood and urine cultures sent.  Transferred to [facility] for further care.     SUBJECTIVE:  RN reports concern for bradycardia / junctional rhythm overnight.  Remains on precedex.  Hemodynamically stable.  Afebrile/Tmax 98.8.  I/O 10L+ for admit.      VITAL SIGNS: BP (!) 73/52   Pulse (!) 48   Temp 98.8 F (37.1 C)   Resp 20   Ht 6' 2" (1.88 m)   Wt (!) 353 lb 6.4 oz (160.3 kg)   SpO2 100%   BMI 45.37 kg/m   HEMODYNAMICS: CVP:  [8 mmHg-20 mmHg] 8 mmHg  VENTILATOR SETTINGS: Vent Mode: PRVC FiO2 (%):  [40 %] 40 % Set Rate:  [18 bmp] 18 bmp Vt Set:  [295 mL] 660  mL PEEP:  [5 cmH20] 5 cmH20 Plateau Pressure:  [16 cmH20-17 cmH20] 17 cmH20  INTAKE / OUTPUT: I/O last 3 completed shifts: In: 5765.9 [I.V.:4865.9; IV Piggyback:900] Out: 3020 [Urine:3020]  PHYSICAL EXAMINATION: General:  Obese male in NAD on vent HEENT: MM pink/moist, ETT, bite block in place Neuro: Awakens to voice, drowsy, follows commands CV: s1s2 rrr, brady, no m/r/g PULM: even/non-labored, lungs bilaterally clear, diminished bases  MW:UXLK, non-tender, bsx4 active  Extremities: warm/dry, no overt edema  Skin: erythema in abdominal folds, warm/dry   LABS:  BMET Recent Labs  Lab 10/02/17 0430 10/02/17 1334 10/03/17 0228  NA 140 139 141  K 4.2 4.0 3.7  CL 109 109 111  CO2 21* 19* 19*  BUN 53* 54* 56*  CREATININE 2.80* 2.41* 2.18*  GLUCOSE 161* 177* 207*    Electrolytes Recent Labs  Lab 10/01/17 0257 10/02/17 0430 10/02/17 1334 10/02/17 1546 10/03/17 0228  CALCIUM 7.6* 7.2* 7.0*  --  7.3*  MG 1.4*  --   --  2.1 2.2  PHOS 3.3  3.2  --   --  2.6 2.7    CBC Recent Labs  Lab 10/01/17 0257 10/02/17 0430 10/03/17 0228  WBC 27.8* 31.6* 33.0*  HGB 10.1* 9.1* 9.1*  HCT 31.0* 27.6* 26.5*  PLT 113* 107* 73*    Coag's Recent Labs  Lab 09/30/17 1855 10/01/17 0257  10/01/17 1844  10/02/17 0430 10/03/17 0228  APTT  --   --    < > 79* 66* 52*  INR 2.86 2.79  --   --   --  1.55   < > = values in this interval not displayed.    Sepsis Markers Recent Labs  Lab 10/01/17 0257  10/01/17 1219 10/02/17 0430 10/02/17 1016 10/02/17 1334 10/03/17 0228  LATICACIDVEN  --    < > 1.9  --  2.0* 2.0*  --   PROCALCITON >150.00  --   --  >150.00  --   --  134.89   < > = values in this interval not displayed.    ABG Recent Labs  Lab 10/01/17 0442 10/01/17 1817 10/01/17 2357  PHART 7.355 7.223* 7.391  PCO2ART 30.5* 42.9 28.1*  PO2ART 184* 131.0* 187.0*    Liver Enzymes Recent Labs  Lab 09/30/17 1732 10/01/17 0257 10/02/17 0430  AST 94*  --  50*   ALT 63  --  42  ALKPHOS 64  --  62  BILITOT 0.9  --  0.8  ALBUMIN 2.7* 2.5* 2.3*    Cardiac Enzymes Recent Labs  Lab 10/01/17 1219 10/01/17 2114  TROPONINI 0.16* 0.16*    Glucose Recent Labs  Lab 10/02/17 0814 10/02/17 1158 10/02/17 1512 10/02/17 1935 10/03/17 0035 10/03/17 0421  GLUCAP 154* 173* 167* 178* 173* 190*    Imaging  5/1 CT ABD >> Uncomplicated cholelithiasis. Nonobstructing left lower pole renal calculus. No hydroureteronephrosis. Stable right double pigtail ureteral stent in place with decompressed bladder containing a Foley catheter. Patchy airspace opacities at each lung base, left greater than right compatible with atelectasis. Minimal superimposed pneumonia is not excluded given history of fever and sepsis. Thoracolumbar spondylosis. EEG 5/3 >> moderate to severe generalized nonspecific cerebral dysfunction.  Presence of triphasic waves can be suggestive of a toxic metabolic encephalopathy. No seizures.  ECHO 5/4 >>   CULTURES: 5/1 BCx 2 >> staph aureus >>             GNR >> 5/1 UC >> greater 100k GNR >>  5/1 BCID >> MRSA detected & e-coli  ANTIBIOTICS: Zosyn 5/1 >> 5/2 Vancomycin 5/1 >> Ceftriaxone 5/3 >>  SIGNIFICANT EVENTS: 5/01 transfer from [facility] ED to [facility] admitted  5/03 Bicarb gtt, low dose pressors, port removed, Tmax 101.7, 8L+ positive for admit 5/04 Weaning on PSV  LINES/TUBES: Port in Right chest 04/2015 Chronic indwelling foley, changed 5/2 >> R femoral CVL TL 5/1 >>   ETT 5/2 >>  DISCUSSION: 57 yo M presenting with fever and AMS one day after having chronic foley exchanged.  Transferred from [facility] ER for septic shock.  Intubated 5/2 for airway protection.    ASSESSMENT / PLAN:  PULMONARY A: At risk for respiratory insufficiency with acute encephalopathy  Sleep apnea P:   PSV wean for possible extubation  Follow CXR  FiO2 for sats > 90%  Lasix 40 mg IV x1  CPAP QHS post extubation   CARDIOVASCULAR A:  Shock - septic   Bradycardia  Hx systolic HF (EF 72-82% in 03/2016), Aflutter on Xarelto, HTN, HLD P:  ICU monitoring  EKG now (RN concerned for change in rhythm).  Suspect related to precedex Assess ECHO  Discontinue solu-cortef after today's dosing  Heparin gtt per pharmacy  Hold home Xaretlo Lasix as above, hold home torsemide  RENAL A:   AKI on CKD - sCr 3.48 (up from 1.54 on 3/15) R ureteral stent - placed 3/1, no evidence of  obstruction/hydro on CT Chronic foley  Non - Gap acidosis HypoMag  P:   Trend BMP / urinary output Replace electrolytes as indicated Avoid nephrotoxic agents, ensure adequate renal perfusion Reduce LR to KVO  GASTROINTESTINAL A:   No acute issues  P:   NPO  If not extubated 5/4, begin TF  Pepcid for SUP  HEMATOLOGIC A:   Anemia - chronic  Thrombocytopenia Elevated INR Coagulated on Xarelto Hx multiple myeloma - previously on maintenance with lenalidomide and dexamethasone - currently off due to recurrent severe infections P:  Trend CBC  Heparin gtt per pharmacy  Monitor for bleeding  Hold heparin gtt for one hour, then pull femoral central line.  Discussed need to hold pressure for extended time with RN.   Transfuse per ICU guidelines  INFECTIOUS A:   Urosepsis  MRSA bacteremia E Coli bacteremia Fever - in setting of bacteremia P:   ID following, appreciate input  Follow cultures  Continue abx  Discontinue central line 5/4 > discussed with bedside RN.  Plan for line holiday.   ENDOCRINE A:   DM P:   Change SSI to Q4 with resistant scale  Add home Levemir dosing, 10 units QD  NEUROLOGIC A:   Acute encephalopathy related infection R/O Seizure - EEG negative 5/3 Chronic debilitation  P:   Follow neuro exam  Minimize sedating medications  Early PT efforts  EEG as above.  [place] neurology.  Antibiotics changed with twitching. Wife reports he "does that at home"   FAMILY  - Updates: No family at bedside am 5/4 on NP rounding.   Wife, [name] contact info: [phone] or [phone]).     CC Time: 30 minutes   [name], NP-C [facility] Pgr: [phone] or if no answer [phone] 10/03/2017, 7:55 AM

## 2017-10-03 NOTE — Progress Notes (Signed)
Spoke with Shea Stakes RN re PICC placement once blood cultures pending.  RN to request for MD to cancel PICC and reorder once okay to place PICC line.

## 2017-10-03 NOTE — Progress Notes (Signed)
NIF = -35 VC = 1037mls SBI = 58

## 2017-10-03 NOTE — Progress Notes (Addendum)
Subjective: Patient in intubated but not sedated, sleeping but easily arousable.  He was interactive initially at the beginning of assessment and became more sleepy and lethargic towards the end.  Patient nods yes when asked if he is doing okay.  Currently aphasic due to being intubated.  Exam: Vitals:   10/03/17 1230 10/03/17 1300  BP: 115/68 133/76  Pulse: 65 (!) 49  Resp: (!) 29 (!) 29  Temp: 98.4 F (36.9 C) 98.4 F (36.9 C)  SpO2: 100% 100%    Physical Exam  HEENT-  Normocephalic, she is currently intubated but not sedated.  Normal external eye and conjunctiva.   Cardiovascular-irregularly irregular heart rhythm S1-S2 audible, pulses palpable throughout   Lungs-vented breath sounds to respiration abdomen- All 4 quadrants palpated and nontender Musculoskeletal- stiff joints, bilateral hands in mitts Skin-warm and dry,  Neuro:  Mental Status: Lethargic but easily arousable for several minutes and often goes back to sleep.  Poor concentration and follows commands minimally initially but stopped through assessment.  Patient is aphasic at this time but did attempt to monitor answers with head.   Cranial Nerves: II: Unable to accurately assess  III,IV, VI: Blinks to threat, ptosis not present, not concentrating enough to assess extraocular motion but does track with eyes.   V,VII: Patient intubated and does attempt to smile and grimaces bilaterally to noxious stimuli . VIII: hearing intact to voice IX,X: pt is intubated XI: bilateral shoulder shrug XII: attempt to move tongue out of his mouth, but currently intubated Motor: Patient extremities relaxed no jerking or tremors noted.  And not following commands enough to assess for strength. Tone and bulk:normal tone throughout; no atrophy noted Sensory: Grimaces to pinprick bilaterally and reacts to noxious stimuli.   Deep Tendon Reflexes: 2+ and symmetric in patella, +3 beats clonus in feet Plantars: Right: downgoing   Left:  downgoing Cerebellar/Gait: Unable to accurately assess patient with poor concentration and falls back asleep.  Medications:  . chlorhexidine gluconate (MEDLINE KIT)  15 mL Mouth Rinse BID  . Chlorhexidine Gluconate Cloth  6 each Topical Q0600  . [START ON 10/07/2017] Chlorhexidine Gluconate Cloth  6 each Topical Daily  . feeding supplement (VITAL HIGH PROTEIN)  1,000 mL Per Tube Q24H  . furosemide  40 mg Intravenous Once  . hydrocortisone sodium succinate  50 mg Intravenous Q12H  . insulin aspart  0-20 Units Subcutaneous Q4H  . insulin detemir  10 Units Subcutaneous Daily  . mouth rinse  15 mL Mouth Rinse 10 times per day  . mupirocin ointment  1 application Nasal BID  . sodium chloride flush  10-40 mL Intracatheter Q12H    Pertinent Labs/Diagnostics:  Assessment: [name] is an 57 y.o. male with extensive past medical history which includes morbid obesity, multiple myeloma on steroids, systolic heart failure, hypertension, a flutter on Xarelto, chronic kidney disease stage III, diabetes who presented to any pain ER with altered mental status.  She recently had Klebsiella bacteremia from obstructing ureteral stone with ureteral stent placed on 3/1 and chronic Foley.  Wife had reported that patient at baseline is very coherent but on 4/30 noted decline.  Patient currently is in ICU and intubated.  Today it was noted that patient had a possible seizure activity versus myoclonus.  Left arm had a rhythmic activity however at times patient would have bilateral flexion which would go toward midline. Patient is status post surgery for right internal jugular vein port removal in [place] to MRSA bacteremia which cause septic  shock.  1. Polymyoclonus in the setting of septic shock MRSA and Ecoli most likely due to urinary tract infection precipitated by recent Foley catheter change, renal insufficiency, antibiotic use.    Merrem and flora quinolones has since been changed to vancomycin and Rocephin.   Per nurse, patient's wife states patient has had multiple similar episodes over period of time asleep while at home.  EEG did not show evidence of seizure or seizure disposition, but did show presence of triphasic waves which could be suggestive of toxic metabolic encephalopathy.  Neuro would recommend aggressive management of metabolic and infectious etiologies.  2. MRSA and E. coli bacteremia, most likely due to a urinary tract infection precipitated by recent Foley catheter change 3.  Septic shock 4.  Acute encephalopathy 5 UTI with hematuria 6.  AKI on CKD 7 Atrial Flutter on Xarelto, hypertension, dyslipidemia   Recommendations: -Avoid Merrem and fluoroquinolones, continue current antibiotic regimen -Aggressive management of metabolic and infectious comorbidities  -Consider starting Keppra if movements continue to be problematic  Letha Cape DNP Neuro-hospitalist Team [phone] 10/03/2017, 1:56 PM   I have seen the patient and reviewed the note.   Suspect TME with polymyoclonus. Movements of concern were captured on EEG and were not seizure. He is improving.   Abx per ID.   Please call with any further questions or concern.  Roland Rack, MD [facility] [phone]  If 7pm- 7am, please page neurology on call as listed in [place].

## 2017-10-03 NOTE — Progress Notes (Signed)
aPTT drawn this am was 32 (below goal range 66 - 102) Hep gtt had been off when level drawn d/t removal of lines Will redraw labs when heparin has been restarted for ~ 6 hrs

## 2017-10-03 NOTE — Final Consult Note (Signed)
Consultant Final Sign-Off Note    Assessment/Final recommendations  [name] is a 57 y.o. male followed by me for Left port removal   Wound care (if applicable): surgical glue intact no dressing needed   Diet at discharge: per primary team   Activity at discharge: per primary team   Follow-up appointment:  Dr. Walden Field. No f/u required with [facility] unless another port is needed   Pending results:  Unresulted Labs (From admission, onward)   Start     Ordered   10/04/17 7616  Basic metabolic panel  Tomorrow morning,   R    Question:  Specimen collection method  Answer:  Unit=Unit collect   10/03/17 0801   10/04/17 0500  Magnesium  Tomorrow morning,   R    Question:  Specimen collection method  Answer:  Unit=Unit collect   10/03/17 0801   10/04/17 0500  CBC  Tomorrow morning,   R    Question:  Specimen collection method  Answer:  Unit=Unit collect   10/03/17 0801   10/03/17 0848  Troponin I  Now then every 6 hours,   R    Question:  Specimen collection method  Answer:  Unit=Unit collect   10/03/17 0847   10/03/17 0500  Procalcitonin  Daily,   R    Question:  Specimen collection method  Answer:  Unit=Unit collect   10/02/17 0958   10/02/17 0500  Heparin level (unfractionated)  Daily,   R     10/01/17 0552   10/02/17 0500  APTT  Daily,   R    Question:  Specimen collection method  Answer:  Unit=Unit collect   10/01/17 1614       Medication recommendations:   Other recommendations:    Thank you for allowing [place] to participate in the care of your patient!  Please consult [place] again if you have further needs for your patient.  Kalman Drape 10/03/2017 11:29 AM    Subjective   CC: port removal  Pt is intubated. Wife at bedside. No questions regarding port removal.  Objective  Vital signs in last 24 hours: Temp:  [98.6 F (37 C)-102 F (38.9 C)] 98.6 F (37 C) (05/04 1000) Pulse Rate:  [42-63] 50 (05/04 1000) Resp:  [0-30] 0 (05/04 1000) SpO2:  [96 %-100 %]  100 % (05/04 1000) Arterial Line BP: (82-146)/(36-84) 102/62 (05/04 1000) FiO2 (%):  [40 %] 40 % (05/04 0856) Weight:  [160.3 kg (353 lb 6.4 oz)] 160.3 kg (353 lb 6.4 oz) (05/04 0500)  PE: General: intubated Chest: port site removal is clean with glue intact and without surrounding erythema or drainage.  Pertinent labs and Studies: Recent Labs    10/01/17 0257 10/02/17 0430 10/03/17 0228  WBC 27.8* 31.6* 33.0*  HGB 10.1* 9.1* 9.1*  HCT 31.0* 27.6* 26.5*   BMET Recent Labs    10/02/17 1334 10/03/17 0228  NA 139 141  K 4.0 3.7  CL 109 111  CO2 19* 19*  GLUCOSE 177* 207*  BUN 54* 56*  CREATININE 2.41* 2.18*  CALCIUM 7.0* 7.3*   No results for input(s): LABURIN in the last 72 hours. Results for orders placed or performed during the hospital encounter of 09/30/17  Culture, blood (Routine x 2)     Status: Abnormal   Collection Time: 09/30/17  5:32 PM  Result Value Ref Range Status   Specimen Description   Final    RIGHT ANTECUBITAL Performed at [facility], [address] [place], [place] [address]  Special Requests   Final    BOTTLES DRAWN AEROBIC AND ANAEROBIC Blood Culture adequate volume Performed at [facility], [address], [place], [place] [address]    Culture  Setup Time   Final    GRAM NEGATIVE RODS ANAEROBIC BOTTLE Gram Stain Report Called to,Read Back By and Verified With: FIELDS S. AT [place] AT [place] ON [id number] BY THOMPSON S. [place] IN CLUSTERS RECOVERED FROM THE AEROBIC BOTTLE Gram Stain Report Called to,Read Back By and Verified With: JOHNSON,J. AT 1258 ON 10/01/2017 BY BAUGHAM,M. Performed at [place].  VALUE IS CONSISTENT WITH PREVIOUSLY REPORTED AND CALLED VALUE. Performed at [facility], [place] [address] [place], [place] [address]    Culture (A)  Final    METHICILLIN RESISTANT STAPHYLOCOCCUS AUREUS ESCHERICHIA COLI    Report Status 10/03/2017 FINAL  Final   Organism ID, Bacteria METHICILLIN  RESISTANT STAPHYLOCOCCUS AUREUS  Final   Organism ID, Bacteria ESCHERICHIA COLI  Final      Susceptibility   Escherichia coli - MIC*    AMPICILLIN >=32 RESISTANT Resistant     CEFAZOLIN <=4 SENSITIVE Sensitive     CEFEPIME <=1 SENSITIVE Sensitive     CEFTAZIDIME <=1 SENSITIVE Sensitive     CEFTRIAXONE <=1 SENSITIVE Sensitive     CIPROFLOXACIN >=4 RESISTANT Resistant     GENTAMICIN 8 INTERMEDIATE Intermediate     IMIPENEM <=0.25 SENSITIVE Sensitive     TRIMETH/SULFA >=320 RESISTANT Resistant     AMPICILLIN/SULBACTAM >=32 RESISTANT Resistant     PIP/TAZO <=4 SENSITIVE Sensitive     Extended ESBL NEGATIVE Sensitive     * ESCHERICHIA COLI   Methicillin resistant staphylococcus aureus - MIC*    CIPROFLOXACIN >=8 RESISTANT Resistant     ERYTHROMYCIN >=8 RESISTANT Resistant     GENTAMICIN <=0.5 SENSITIVE Sensitive     OXACILLIN >=4 RESISTANT Resistant     TETRACYCLINE >=16 RESISTANT Resistant     VANCOMYCIN 1 SENSITIVE Sensitive     TRIMETH/SULFA >=320 RESISTANT Resistant     CLINDAMYCIN >=8 RESISTANT Resistant     RIFAMPIN <=0.5 SENSITIVE Sensitive     Inducible Clindamycin NEGATIVE Sensitive     * METHICILLIN RESISTANT STAPHYLOCOCCUS AUREUS  Urine culture     Status: Abnormal (Preliminary result)   Collection Time: 09/30/17  5:32 PM  Result Value Ref Range Status   Specimen Description   Final    URINE, RANDOM Performed at [facility], [address] [place], [place] [address]    Special Requests   Final    NONE Performed at [facility], [address], [place], [place] [address]    Culture >=100,000 COLONIES/mL GRAM NEGATIVE RODS (A)  Final   Report Status PENDING  Incomplete  Culture, blood (Routine x 2)     Status: Abnormal (Preliminary result)   Collection Time: 09/30/17  5:37 PM  Result Value Ref Range Status   Specimen Description   Final    RIGHT ANTECUBITAL Performed at [facility], [address] [place], [place] [address]    Special Requests   Final    BOTTLES  DRAWN AEROBIC AND ANAEROBIC Blood Culture adequate volume Performed at [facility], [address], [place], [place] [address]    Culture  Setup Time   Final    GRAM NEGATIVE RODS ANAEROBIC BOTTLE ONLY Gram Stain Report Called to,Read Back By and Verified With: FIELDS S. AT [facility] AT 0920A ON [id number] BY THOMPSON S. GRAM POSITIVE COCCI IN CLUSTERS AEROBIC  BOTTLE ONLY Gram Stain Report Called to,Read Back By and Verified With: JOHNSON,J. AT 6433 ON 10/01/2017 BY BAUGHAM,M. Performed at [facility]    Culture (A)  Final    STAPHYLOCOCCUS AUREUS SUSCEPTIBILITIES PERFORMED ON PREVIOUS CULTURE WITHIN THE LAST 5 DAYS. GRAM NEGATIVE RODS CULTURE REINCUBATED FOR BETTER GROWTH Performed at [facility], [place] [address], [place], [place] [address]    Report Status PENDING  Incomplete  Blood Culture ID Panel (Reflexed)     Status: Abnormal   Collection Time: 09/30/17  5:37 PM  Result Value Ref Range Status   Enterococcus species NOT DETECTED NOT DETECTED Final   Listeria monocytogenes NOT DETECTED NOT DETECTED Final   Staphylococcus species DETECTED (A) NOT DETECTED Final    Comment: CRITICAL RESULT CALLED TO, READ BACK BY AND VERIFIED WITH: Asencion Islam PharmD 16:05 10/01/17 ([email or web address])    Staphylococcus aureus DETECTED (A) NOT DETECTED Final    Comment: Methicillin (oxacillin)-resistant Staphylococcus aureus (MRSA). MRSA is predictably resistant to beta-lactam antibiotics (except ceftaroline). Preferred therapy is vancomycin unless clinically contraindicated. Patient requires contact precautions if  hospitalized. CRITICAL RESULT CALLED TO, READ BACK BY AND VERIFIED WITH: Asencion Islam PharmD 16:05 10/01/17 (wilsonm)    Methicillin resistance DETECTED (A) NOT DETECTED Final    Comment: CRITICAL RESULT CALLED TO, READ BACK BY AND VERIFIED WITH: Asencion Islam PharmD 16:05 10/01/17 (wilsonm)    Streptococcus species NOT DETECTED NOT DETECTED Final   Streptococcus agalactiae NOT DETECTED NOT  DETECTED Final   Streptococcus pneumoniae NOT DETECTED NOT DETECTED Final   Streptococcus pyogenes NOT DETECTED NOT DETECTED Final   Acinetobacter baumannii NOT DETECTED NOT DETECTED Final   Enterobacteriaceae species DETECTED (A) NOT DETECTED Final    Comment: Enterobacteriaceae represent a large family of gram-negative bacteria, not a single organism. CRITICAL RESULT CALLED TO, READ BACK BY AND VERIFIED WITH: Jacinto Reap Mancheril PharmD 16:05 10/01/17 ([email or web address])    Enterobacter cloacae complex NOT DETECTED NOT DETECTED Final   Escherichia coli DETECTED (A) NOT DETECTED Final    Comment: CRITICAL RESULT CALLED TO, READ BACK BY AND VERIFIED WITH: Asencion Islam PharmD 16:05 10/01/17 ([email or web address])    Klebsiella oxytoca NOT DETECTED NOT DETECTED Final   Klebsiella pneumoniae NOT DETECTED NOT DETECTED Final   Proteus species NOT DETECTED NOT DETECTED Final   Serratia marcescens NOT DETECTED NOT DETECTED Final   Carbapenem resistance NOT DETECTED NOT DETECTED Final   Haemophilus influenzae NOT DETECTED NOT DETECTED Final   Neisseria meningitidis NOT DETECTED NOT DETECTED Final   Pseudomonas aeruginosa NOT DETECTED NOT DETECTED Final   Candida albicans NOT DETECTED NOT DETECTED Final   Candida glabrata NOT DETECTED NOT DETECTED Final   Candida krusei NOT DETECTED NOT DETECTED Final   Candida parapsilosis NOT DETECTED NOT DETECTED Final   Candida tropicalis NOT DETECTED NOT DETECTED Final    Comment: Performed at [facility], [place] [address], [place], [place] [address]  MRSA PCR Screening     Status: Abnormal   Collection Time: 10/01/17  2:26 AM  Result Value Ref Range Status   MRSA by PCR POSITIVE (A) NEGATIVE Final    Comment:        The GeneXpert MRSA Assay (FDA approved for NASAL specimens only), is one component of a comprehensive MRSA colonization surveillance program. It is not intended to diagnose MRSA infection nor to guide or monitor treatment for MRSA infections. RESULT CALLED TO,  READ BACK BY AND VERIFIED WITH: B SHEPARD RN 10/01/17 0438 JDW   Culture, blood (  routine x 2)     Status: None (Preliminary result)   Collection Time: 10/02/17  6:00 AM  Result Value Ref Range Status   Specimen Description   Final    BLOOD LEFT HAND Performed at [facility], [place] [address] [place], [place] [address]    Special Requests   Final    BOTTLES DRAWN AEROBIC ONLY Blood Culture adequate volume   Culture PENDING  Incomplete   Report Status PENDING  Incomplete  Culture, blood (routine x 2)     Status: None (Preliminary result)   Collection Time: 10/02/17  6:14 AM  Result Value Ref Range Status   Specimen Description   Final    BLOOD RIGHT HAND Performed at [facility], [place] [address], [place], [place] [address]    Special Requests   Final    BOTTLES DRAWN AEROBIC ONLY Blood Culture adequate volume   Culture PENDING  Incomplete   Report Status PENDING  Incomplete   *Note: Due to a large number of results and/or encounters for the requested time period, some results have not been displayed. A complete set of results can be found in Results Review.    Imaging: Dg Chest Port 1 View  Result Date: 10/03/2017 CLINICAL DATA:  Respiratory failure. EXAM: PORTABLE CHEST 1 VIEW COMPARISON:  10/02/2017 FINDINGS: Endotracheal tube remains with the tip approximately 2.4 cm above the carina. Gastric decompression tube extends below the diaphragm. Stable cardiac enlargement. Lungs show improved aeration at both bases since the prior chest x-ray with mild atelectasis/infiltrates remaining, left greater than right. No significant pleural effusions. IMPRESSION: Improved aeration of both lower lungs with residual atelectasis/infiltrates remaining, left greater than right. Electronically Signed   By: Aletta Edouard M.D.   On: 10/03/2017 09:16

## 2017-10-03 NOTE — Progress Notes (Signed)
ANTICOAGULATION CONSULT NOTE - Follow Up Consult  Pharmacy Consult for heparin Indication: atrial fibrillation  Labs: Recent Labs    09/30/17 1855 10/01/17 0257 10/01/17 1219  10/01/17 2114 10/02/17 0430 10/02/17 0432 10/02/17 1334 10/03/17 0228 10/03/17 0918 10/03/17 1525  HGB  --  10.1*  --   --   --  9.1*  --   --  9.1*  --   --   HCT  --  31.0*  --   --   --  27.6*  --   --  26.5*  --   --   PLT  --  113*  --   --   --  107*  --   --  73*  --   --   APTT  --   --  70*   < >  --  66*  --   --  52* 37*  --   LABPROT 29.8* 29.2*  --   --   --   --   --   --  18.4*  --   --   INR 2.86 2.79  --   --   --   --   --   --  1.55  --   --   HEPARINUNFRC  --   --  >2.20*   < >  --   --  >2.20*  --  0.83*  --  0.47  CREATININE  --  3.44*  --   --   --  2.80*  --  2.41* 2.18*  --   --   TROPONINI  --   --  0.16*  --  0.16*  --   --   --   --  0.17*  --    < > = values in this interval not displayed.    Assessment: 58yo male on heparin for afib -heparin level at goal  Goal of Therapy:  aPTT 66-102 seconds   Plan: -No heparin changes needed -Daily heparin level and CBC  Hildred Laser, PharmD Clinical Pharmacist 10/03/2017 5:04 PM

## 2017-10-03 NOTE — Progress Notes (Signed)
Patient ID: [name], male   DOB: 06/20/1960, 57 y.o.   MRN: [id number]         [facility]  Date of Admission:  09/30/2017            Day 4 vancomycin        Day 1 ceftriaxone ASSESSMENT: He has MRSA and E. coli bacteremia, most likely due to a urinary tract infection precipitated by recent Foley catheter change.  CT scan shows some left lower lobe infiltrate but his wife tells me that he did not have any acute respiratory symptoms prior to admission.  He is improving.  I will continue vancomycin and ceftriaxone.  PLAN: 1. Continue vancomycin and ceftriaxone 2. Await results of repeat blood cultures and transthoracic echocardiogram  Active Problems:   Urinary tract infection with hematuria   MRSA bacteremia   E. coli bacteremia   Septic shock (HCC)   Acute encephalopathy   Scheduled Meds: . chlorhexidine gluconate (MEDLINE KIT)  15 mL Mouth Rinse BID  . Chlorhexidine Gluconate Cloth  6 each Topical Q0600  . [START ON 10/07/2017] Chlorhexidine Gluconate Cloth  6 each Topical Daily  . feeding supplement (VITAL HIGH PROTEIN)  1,000 mL Per Tube Q24H  . hydrocortisone sodium succinate  50 mg Intravenous Q12H  . insulin aspart  0-20 Units Subcutaneous Q4H  . insulin detemir  10 Units Subcutaneous Daily  . mouth rinse  15 mL Mouth Rinse 10 times per day  . mupirocin ointment  1 application Nasal BID  . sodium chloride flush  10-40 mL Intracatheter Q12H   Continuous Infusions: . sodium chloride 5 mL/hr at 10/03/17 0700  . cefTRIAXone (ROCEPHIN)  IV Stopped (10/02/17 2130)  . dexmedetomidine (PRECEDEX) IV infusion Stopped (10/03/17 0725)  . famotidine (PEPCID) IV Stopped (10/03/17 0915)  . heparin Stopped (10/03/17 0753)  . lactated ringers 10 mL/hr at 10/03/17 0802  . vancomycin 1,750 mg (10/02/17 1400)   PRN Meds:.sodium chloride, acetaminophen **OR** acetaminophen, fentaNYL (SUBLIMAZE) injection, fentaNYL (SUBLIMAZE) injection, midazolam,  ondansetron (ZOFRAN) IV, sodium chloride flush  Review of Systems: Review of Systems  Unable to perform ROS: Intubated    No Known Allergies  OBJECTIVE: Vitals:   10/03/17 0900 10/03/17 1000 10/03/17 1130 10/03/17 1133  BP:    105/71  Pulse: (!) 49 (!) 50  (!) 52  Resp: 19 (!) 0  (!) 24  Temp: 98.6 F (37 C) 98.6 F (37 C)  98.4 F (36.9 C)  TempSrc:      SpO2: 100% 100% 100% 100%  Weight:      Height:       Body mass index is 45.37 kg/m.  Physical Exam  Constitutional:  He is on the ventilator.  He is lethargic.  His wife is at the bedside.  Cardiovascular: Normal rate, regular rhythm and normal heart sounds.  He is off pressors.  Pulmonary/Chest: Breath sounds normal.  Right anterior chest incision from recent Port-A-Cath removal looks good.  Abdominal: Soft. He exhibits distension.  His nurse was removing his right femoral catheter at the time of my exam.  Genitourinary:  Genitourinary Comments: Foley catheter in place.  No evidence of perineal infection.    Lab Results Lab Results  Component Value Date   WBC 33.0 (H) 10/03/2017   HGB 9.1 (L) 10/03/2017   HCT 26.5 (L) 10/03/2017   MCV 89.2 10/03/2017   PLT 73 (L) 10/03/2017    Lab Results  Component Value Date   CREATININE 2.18 (  H) 10/03/2017   BUN 56 (H) 10/03/2017   NA 141 10/03/2017   K 3.7 10/03/2017   CL 111 10/03/2017   CO2 19 (L) 10/03/2017    Lab Results  Component Value Date   ALT 42 10/02/2017   AST 50 (H) 10/02/2017   ALKPHOS 62 10/02/2017   BILITOT 0.8 10/02/2017     Microbiology: Recent Results (from the past 240 hour(s))  Culture, blood (Routine x 2)     Status: Abnormal   Collection Time: 09/30/17  5:32 PM  Result Value Ref Range Status   Specimen Description   Final    RIGHT ANTECUBITAL Performed at [facility], [address] [place], [place] [address]    Special Requests   Final    BOTTLES DRAWN AEROBIC AND ANAEROBIC Blood Culture adequate volume Performed at [facility], [address], [place], [place] [address]    Culture  Setup Time   Final    GRAM NEGATIVE RODS ANAEROBIC BOTTLE Gram Stain Report Called to,Read Back By and Verified With: FIELDS S. AT [place] AT 0920A ON [id number] BY THOMPSON S. GRAM POSITIVE COCCI IN CLUSTERS RECOVERED FROM THE AEROBIC BOTTLE Gram Stain Report Called to,Read Back By and Verified With: JOHNSON,J. AT 2836 ON 10/01/2017 BY BAUGHAM,M. Performed at [place].  VALUE IS CONSISTENT WITH PREVIOUSLY REPORTED AND CALLED VALUE. Performed at [facility], [place] [address], [place], [place] [address]    Culture (A)  Final    METHICILLIN RESISTANT STAPHYLOCOCCUS AUREUS ESCHERICHIA COLI    Report Status 10/03/2017 FINAL  Final   Organism ID, Bacteria METHICILLIN RESISTANT STAPHYLOCOCCUS AUREUS  Final   Organism ID, Bacteria ESCHERICHIA COLI  Final      Susceptibility   Escherichia coli - MIC*    AMPICILLIN >=32 RESISTANT Resistant     CEFAZOLIN <=4 SENSITIVE Sensitive     CEFEPIME <=1 SENSITIVE Sensitive     CEFTAZIDIME <=1 SENSITIVE Sensitive     CEFTRIAXONE <=1 SENSITIVE Sensitive     CIPROFLOXACIN >=4 RESISTANT Resistant     GENTAMICIN 8 INTERMEDIATE Intermediate     IMIPENEM <=0.25 SENSITIVE Sensitive     TRIMETH/SULFA >=320 RESISTANT Resistant     AMPICILLIN/SULBACTAM >=32 RESISTANT Resistant     PIP/TAZO <=4 SENSITIVE Sensitive     Extended ESBL NEGATIVE Sensitive     * ESCHERICHIA COLI   Methicillin resistant staphylococcus aureus - MIC*    CIPROFLOXACIN >=8 RESISTANT Resistant     ERYTHROMYCIN >=8 RESISTANT Resistant     GENTAMICIN <=0.5 SENSITIVE Sensitive     OXACILLIN >=4 RESISTANT Resistant     TETRACYCLINE >=16 RESISTANT Resistant     VANCOMYCIN 1 SENSITIVE Sensitive     TRIMETH/SULFA >=320 RESISTANT Resistant     CLINDAMYCIN >=8 RESISTANT Resistant     RIFAMPIN <=0.5 SENSITIVE Sensitive     Inducible Clindamycin NEGATIVE Sensitive     * METHICILLIN RESISTANT  STAPHYLOCOCCUS AUREUS  Urine culture     Status: Abnormal (Preliminary result)   Collection Time: 09/30/17  5:32 PM  Result Value Ref Range Status   Specimen Description   Final    URINE, RANDOM Performed at [facility], [address], [place], [place] [address]    Special Requests   Final    NONE Performed at [facility], [address] [place],  [address]    Culture >=100,000 COLONIES/mL GRAM NEGATIVE RODS (A)  Final   Report Status PENDING  Incomplete  Culture, blood (Routine x  2)     Status: Abnormal (Preliminary result)   Collection Time: 09/30/17  5:37 PM  Result Value Ref Range Status   Specimen Description   Final    RIGHT ANTECUBITAL Performed at [facility], [address], [place], [place] [address]    Special Requests   Final    BOTTLES DRAWN AEROBIC AND ANAEROBIC Blood Culture adequate volume Performed at [facility], [address] [place], [place] [address]    Culture  Setup Time   Final    GRAM NEGATIVE RODS ANAEROBIC BOTTLE ONLY Gram Stain Report Called to,Read Back By and Verified With: FIELDS S. AT [place] AT 0920A ON [id number] BY THOMPSON S. GRAM POSITIVE COCCI IN CLUSTERS AEROBIC BOTTLE ONLY Gram Stain Report Called to,Read Back By and Verified With: JOHNSON,J. AT 3546 ON 10/01/2017 BY BAUGHAM,M. Performed at [facility]    Culture (A)  Final    STAPHYLOCOCCUS AUREUS SUSCEPTIBILITIES PERFORMED ON PREVIOUS CULTURE WITHIN THE LAST 5 DAYS. GRAM NEGATIVE RODS CULTURE REINCUBATED FOR BETTER GROWTH Performed at [facility], [place] [address], [place], [place] [address]    Report Status PENDING  Incomplete  Blood Culture ID Panel (Reflexed)     Status: Abnormal   Collection Time: 09/30/17  5:37 PM  Result Value Ref Range Status   Enterococcus species NOT DETECTED NOT DETECTED Final   Listeria monocytogenes NOT DETECTED NOT DETECTED Final   Staphylococcus species DETECTED (A) NOT DETECTED Final    Comment: CRITICAL RESULT CALLED TO, READ BACK  BY AND VERIFIED WITH: Asencion Islam PharmD 16:05 10/01/17 (wilsonm)    Staphylococcus aureus DETECTED (A) NOT DETECTED Final    Comment: Methicillin (oxacillin)-resistant Staphylococcus aureus (MRSA). MRSA is predictably resistant to beta-lactam antibiotics (except ceftaroline). Preferred therapy is vancomycin unless clinically contraindicated. Patient requires contact precautions if  hospitalized. CRITICAL RESULT CALLED TO, READ BACK BY AND VERIFIED WITH: Asencion Islam PharmD 16:05 10/01/17 (wilsonm)    Methicillin resistance DETECTED (A) NOT DETECTED Final    Comment: CRITICAL RESULT CALLED TO, READ BACK BY AND VERIFIED WITH: Asencion Islam PharmD 16:05 10/01/17 (wilsonm)    Streptococcus species NOT DETECTED NOT DETECTED Final   Streptococcus agalactiae NOT DETECTED NOT DETECTED Final   Streptococcus pneumoniae NOT DETECTED NOT DETECTED Final   Streptococcus pyogenes NOT DETECTED NOT DETECTED Final   Acinetobacter baumannii NOT DETECTED NOT DETECTED Final   Enterobacteriaceae species DETECTED (A) NOT DETECTED Final    Comment: Enterobacteriaceae represent a large family of gram-negative bacteria, not a single organism. CRITICAL RESULT CALLED TO, READ BACK BY AND VERIFIED WITH: Jacinto Reap Mancheril PharmD 16:05 10/01/17 (wilsonm)    Enterobacter cloacae complex NOT DETECTED NOT DETECTED Final   Escherichia coli DETECTED (A) NOT DETECTED Final    Comment: CRITICAL RESULT CALLED TO, READ BACK BY AND VERIFIED WITH: Asencion Islam PharmD 16:05 10/01/17 (wilsonm)    Klebsiella oxytoca NOT DETECTED NOT DETECTED Final   Klebsiella pneumoniae NOT DETECTED NOT DETECTED Final   Proteus species NOT DETECTED NOT DETECTED Final   Serratia marcescens NOT DETECTED NOT DETECTED Final   Carbapenem resistance NOT DETECTED NOT DETECTED Final   Haemophilus influenzae NOT DETECTED NOT DETECTED Final   Neisseria meningitidis NOT DETECTED NOT DETECTED Final   Pseudomonas aeruginosa NOT DETECTED NOT DETECTED Final   Candida  albicans NOT DETECTED NOT DETECTED Final   Candida glabrata NOT DETECTED NOT DETECTED Final   Candida krusei NOT DETECTED NOT DETECTED Final   Candida parapsilosis NOT DETECTED NOT DETECTED Final   Candida  tropicalis NOT DETECTED NOT DETECTED Final    Comment: Performed at [facility], [place] [address] [place], [place] [address]  MRSA PCR Screening     Status: Abnormal   Collection Time: 10/01/17  2:26 AM  Result Value Ref Range Status   MRSA by PCR POSITIVE (A) NEGATIVE Final    Comment:        The GeneXpert MRSA Assay (FDA approved for NASAL specimens only), is one component of a comprehensive MRSA colonization surveillance program. It is not intended to diagnose MRSA infection nor to guide or monitor treatment for MRSA infections. RESULT CALLED TO, READ BACK BY AND VERIFIED WITH: B SHEPARD RN 10/01/17 0438 JDW   Culture, blood (routine x 2)     Status: None (Preliminary result)   Collection Time: 10/02/17  6:00 AM  Result Value Ref Range Status   Specimen Description   Final    BLOOD LEFT HAND Performed at [facility], [place] [address], [place], [place] [address]    Special Requests   Final    BOTTLES DRAWN AEROBIC ONLY Blood Culture adequate volume   Culture PENDING  Incomplete   Report Status PENDING  Incomplete  Culture, blood (routine x 2)     Status: None (Preliminary result)   Collection Time: 10/02/17  6:14 AM  Result Value Ref Range Status   Specimen Description   Final    BLOOD RIGHT HAND Performed at [facility], [place] [address], [place], [place] [address]    Special Requests   Final    BOTTLES DRAWN AEROBIC ONLY Blood Culture adequate volume   Culture PENDING  Incomplete   Report Status PENDING  Incomplete    Michel Bickers, MD [place] [facility] [phone] pager   [phone] cell 10/03/2017, 12:03 PM

## 2017-10-04 ENCOUNTER — Encounter (HOSPITAL_COMMUNITY): Payer: Self-pay

## 2017-10-04 ENCOUNTER — Inpatient Hospital Stay (HOSPITAL_COMMUNITY): Payer: BLUE CROSS/BLUE SHIELD

## 2017-10-04 DIAGNOSIS — J9601 Acute respiratory failure with hypoxia: Secondary | ICD-10-CM

## 2017-10-04 LAB — CBC
HCT: 31.6 % — ABNORMAL LOW (ref 39.0–52.0)
Hemoglobin: 10.5 g/dL — ABNORMAL LOW (ref 13.0–17.0)
MCH: 29.8 pg (ref 26.0–34.0)
MCHC: 33.2 g/dL (ref 30.0–36.0)
MCV: 89.8 fL (ref 78.0–100.0)
Platelets: 85 10*3/uL — ABNORMAL LOW (ref 150–400)
RBC: 3.52 MIL/uL — ABNORMAL LOW (ref 4.22–5.81)
RDW: 17.7 % — ABNORMAL HIGH (ref 11.5–15.5)
WBC: 39 10*3/uL — ABNORMAL HIGH (ref 4.0–10.5)

## 2017-10-04 LAB — C DIFFICILE QUICK SCREEN W PCR REFLEX
C Diff antigen: NEGATIVE
C Diff interpretation: NOT DETECTED
C Diff toxin: NEGATIVE

## 2017-10-04 LAB — BASIC METABOLIC PANEL
Anion gap: 14 (ref 5–15)
BUN: 70 mg/dL — ABNORMAL HIGH (ref 6–20)
CO2: 19 mmol/L — ABNORMAL LOW (ref 22–32)
Calcium: 7.9 mg/dL — ABNORMAL LOW (ref 8.9–10.3)
Chloride: 112 mmol/L — ABNORMAL HIGH (ref 101–111)
Creatinine, Ser: 2.02 mg/dL — ABNORMAL HIGH (ref 0.61–1.24)
GFR calc Af Amer: 40 mL/min — ABNORMAL LOW (ref >60)
GFR calc non Af Amer: 35 mL/min — ABNORMAL LOW (ref >60)
Glucose, Bld: 236 mg/dL — ABNORMAL HIGH (ref 65–99)
Potassium: 4.2 mmol/L (ref 3.5–5.1)
Sodium: 145 mmol/L (ref 135–145)

## 2017-10-04 LAB — GLUCOSE, CAPILLARY
Glucose-Capillary: 216 mg/dL — ABNORMAL HIGH (ref 65–99)
Glucose-Capillary: 221 mg/dL — ABNORMAL HIGH (ref 65–99)
Glucose-Capillary: 200 mg/dL — ABNORMAL HIGH (ref 65–99)
Glucose-Capillary: 175 mg/dL — ABNORMAL HIGH (ref 65–99)
Glucose-Capillary: 163 mg/dL — ABNORMAL HIGH (ref 65–99)
Glucose-Capillary: 144 mg/dL — ABNORMAL HIGH (ref 65–99)

## 2017-10-04 LAB — HEPARIN LEVEL (UNFRACTIONATED)
Heparin Unfractionated: 0.77 IU/mL — ABNORMAL HIGH (ref 0.30–0.70)
Heparin Unfractionated: 0.84 IU/mL — ABNORMAL HIGH (ref 0.30–0.70)
Heparin Unfractionated: 1.06 IU/mL — ABNORMAL HIGH (ref 0.30–0.70)

## 2017-10-04 LAB — ECHOCARDIOGRAM COMPLETE
Height: 74 in
Weight: 5562.65 oz

## 2017-10-04 LAB — URINE CULTURE: Culture: >=100000 — AB

## 2017-10-04 LAB — APTT
aPTT: 41 seconds — ABNORMAL HIGH (ref 24–36)
aPTT: 47 seconds — ABNORMAL HIGH (ref 24–36)

## 2017-10-04 LAB — MAGNESIUM: Magnesium: 2.4 mg/dL (ref 1.7–2.4)

## 2017-10-04 LAB — PROCALCITONIN: Procalcitonin: 64.71 ng/mL

## 2017-10-04 MED ORDER — CEFAZOLIN SODIUM-DEXTROSE 2-4 GM/100ML-% IV SOLN
2.0000 g | Freq: Three times a day (TID) | INTRAVENOUS | Status: DC
  Administered 2017-10-04 – 2017-10-08 (×13): 2 g via INTRAVENOUS
  Filled 2017-10-04 (×18): qty 100

## 2017-10-04 MED ORDER — FUROSEMIDE 10 MG/ML IJ SOLN
40.0000 mg | Freq: Two times a day (BID) | INTRAMUSCULAR | Status: DC
Start: 2017-10-04 — End: 2017-10-05
  Administered 2017-10-04 – 2017-10-05 (×2): 40 mg via INTRAVENOUS
  Filled 2017-10-04 (×2): qty 4

## 2017-10-04 MED ORDER — FENTANYL CITRATE (PF) 100 MCG/2ML IJ SOLN
25.0000 ug | INTRAMUSCULAR | Status: DC | PRN
  Administered 2017-10-04: 50 ug via INTRAVENOUS

## 2017-10-04 MED ORDER — PERFLUTREN LIPID MICROSPHERE
1.0000 mL | INTRAVENOUS | Status: AC | PRN
  Administered 2017-10-04: 2 mL via INTRAVENOUS
  Filled 2017-10-04: qty 10

## 2017-10-04 MED ORDER — PERFLUTREN LIPID MICROSPHERE
INTRAVENOUS | Status: AC
  Administered 2017-10-04: 2 mL via INTRAVENOUS
  Filled 2017-10-04: qty 10

## 2017-10-04 MED ORDER — INSULIN ASPART 100 UNIT/ML ~~LOC~~ SOLN
0.0000 [IU] | Freq: Three times a day (TID) | SUBCUTANEOUS | Status: DC
  Administered 2017-10-05 (×2): 3 [IU] via SUBCUTANEOUS
  Administered 2017-10-05: 4 [IU] via SUBCUTANEOUS
  Administered 2017-10-06 (×3): 3 [IU] via SUBCUTANEOUS
  Administered 2017-10-07 (×2): 4 [IU] via SUBCUTANEOUS
  Administered 2017-10-07 – 2017-10-08 (×3): 3 [IU] via SUBCUTANEOUS

## 2017-10-04 MED ORDER — ORAL CARE MOUTH RINSE: 15.0000 mL | Freq: Two times a day (BID) | OROMUCOSAL | Status: DC

## 2017-10-04 MED ORDER — INSULIN ASPART 100 UNIT/ML ~~LOC~~ SOLN: 0.0000 [IU] | Freq: Every day | SUBCUTANEOUS | Status: DC

## 2017-10-04 MED ORDER — HEPARIN (PORCINE) IN NACL 100-0.45 UNIT/ML-% IJ SOLN
1500.0000 [IU]/h | INTRAMUSCULAR | Status: DC
  Administered 2017-10-05: 950 [IU]/h via INTRAVENOUS
  Administered 2017-10-06: 1050 [IU]/h via INTRAVENOUS
  Filled 2017-10-04 (×3): qty 250

## 2017-10-04 MED ORDER — FENTANYL CITRATE (PF) 100 MCG/2ML IJ SOLN
25.0000 ug | INTRAMUSCULAR | Status: DC | PRN
  Filled 2017-10-04: qty 2

## 2017-10-04 NOTE — Progress Notes (Signed)
  Echocardiogram 2D Echocardiogram has been performed.  Merrie Roof F 10/04/2017, 3:45 PM

## 2017-10-04 NOTE — Procedures (Signed)
Extubation Procedure Note  Patient Details:   Name: [name] DOB: [birth year] MRN: [id number]   Airway Documentation:    Vent end date: 10/04/17 Vent end time: 0755   Evaluation  O2 sats: stable throughout Complications: No apparent complications Patient did tolerate procedure well. Pt extubated, cuff leak observed before extubation. Pt placed on 3 lpm [name]. No stridor heard. Breath sounds equal and bilat, diminished. Pt oriented to time and place. No distress noted.   Yes  [name] 10/04/2017, 8:39 AM

## 2017-10-04 NOTE — Progress Notes (Signed)
eLink Physician-Brief Progress Note Patient Name: [name] DOB: April 17, 1961 MRN: [id number]   Date of Service  10/04/2017  HPI/Events of Note  Patient is on a Q 4 hour resistant Novolog SSI and is now on a PO diet. Request to change to AC/HS sliding scale.   eICU Interventions  Will order: 1. Change to AC/HS resistant Novolog SSI.      Intervention Category Major Interventions: Hyperglycemia - active titration of insulin therapy  Sommer,Steven Eugene 10/04/2017, 9:30 PM

## 2017-10-04 NOTE — Progress Notes (Addendum)
PULMONARY / CRITICAL CARE MEDICINE   Name: [name] MRN: [id number] DOB: [birth year]    ADMISSION DATE:  09/30/2017 CONSULTATION DATE:  5/1  REFERRING MD:  Dr. Wilson Singer  CHIEF COMPLAINT:  Septic shock   BRIEF SUMMARY: 57 year old male with extensive PMH significant for but not limited to morbid obesity, multiple myeloma on chronic steroids, systolic HF, HTN, GERD, aflutter on Xarelto, CKD stage 3, anemia, DM, chronic foley who presented to [facility] ER with altered mental status.  Patient with fournier's gangrene in 04/2017 and since has frequent hospitalizations with debilitation.  Returned home from rehab in December and since getting home health and PT.  Recent Klebsiella bacteremia from obstructing ureteral stone with ureteral stent placement 3/1 and chronic foley.    Wife reports he is at baseline very coherent and had progressed to walking 100 steps with PT and using walker.  Home health exchanged foley on 4/30 and afterwards reports decline.  Developed altered mental status, decreased PO intake, and fever.   On arrival to ER, patient febrile, minimally responsive, and hypotensive.  Labs noted for WBC 21.4, Hgb 10.6, plts 104, sCr 3.48 (up from 1.54 on 3/15), Na 132, lactic 3.77 with repeat to 3.41.  Received 4.5 L NS and central line placed for vasopressors.  Empirically started on vancomycin and zosyn after blood and urine cultures sent.  Transferred to [facility] for further care.     SUBJECTIVE:  Afebrile.  BMP pending.  Pt turned to PSV 5/5 wean at 0720.  WBC increased to 39  VITAL SIGNS: BP (!) 117/58   Pulse (!) 33   Temp 98.2 F (36.8 C)   Resp 20   Ht _0  (1.88 m)   Wt (!) 347 lb 10.7 oz (157.7 kg)   SpO2 100%   BMI 44.64 kg/m   HEMODYNAMICS: CVP:  [13 mmHg] 13 mmHg  VENTILATOR SETTINGS: Vent Mode: PRVC FiO2 (%):  [40 %] 40 % Set Rate:  [18 bmp] 18 bmp Vt Set:  [660 mL] 660 mL PEEP:  [5 cmH20] 5 cmH20 Pressure Support:  [5 cmH20] 5 cmH20 Plateau Pressure:   [14 cmH20-16 cmH20] 16 cmH20  INTAKE / OUTPUT: I/O last 3 completed shifts: In: 3300.9 [I.V.:2072.9; NG/GT:528; IV Piggyback:700] Out: 2880 [Urine:2880]  PHYSICAL EXAMINATION: General: obese male in NAD on vent   HEENT: MM pink/moist, ETT Neuro: Awake, more alert this am, follow commands / appears appropriate  CV: s1s2 rrr, no m/r/g PULM: even/non-labored, lungs bilaterally coarse, oral clear secretions CH:YIFO, non-tender, bsx4 active  Extremities: warm/dry, 1-2+ generalized edema  Skin: erythema to folds, R groin dressing c/d/i (old TLC site)  LABS:  BMET Recent Labs  Lab 10/02/17 0430 10/02/17 1334 10/03/17 0228  NA 140 139 141  K 4.2 4.0 3.7  CL 109 109 111  CO2 21* 19* 19*  BUN 53* 54* 56*  CREATININE 2.80* 2.41* 2.18*  GLUCOSE 161* 177* 207*    Electrolytes Recent Labs  Lab 10/01/17 0257 10/02/17 0430 10/02/17 1334 10/02/17 1546 10/03/17 0228  CALCIUM 7.6* 7.2* 7.0*  --  7.3*  MG 1.4*  --   --  2.1 2.2  PHOS 3.3  3.2  --   --  2.6 2.7    CBC Recent Labs  Lab 10/02/17 0430 10/03/17 0228 10/04/17 0354  WBC 31.6* 33.0* 39.0*  HGB 9.1* 9.1* 10.5*  HCT 27.6* 26.5* 31.6*  PLT 107* 73* 85*    Coag's Recent Labs  Lab 09/30/17 1855 10/01/17 0257  10/03/17 0228 10/03/17 0918 10/04/17 0354  APTT  --   --    < > 52* 37* 41*  INR 2.86 2.79  --  1.55  --   --    < > = values in this interval not displayed.    Sepsis Markers Recent Labs  Lab 10/01/17 0257  10/01/17 1219 10/02/17 0430 10/02/17 1016 10/02/17 1334 10/03/17 0228  LATICACIDVEN  --    < > 1.9  --  2.0* 2.0*  --   PROCALCITON >150.00  --   --  >150.00  --   --  134.89   < > = values in this interval not displayed.    ABG Recent Labs  Lab 10/01/17 0442 10/01/17 1817 10/01/17 2357  PHART 7.355 7.223* 7.391  PCO2ART 30.5* 42.9 28.1*  PO2ART 184* 131.0* 187.0*    Liver Enzymes Recent Labs  Lab 09/30/17 1732 10/01/17 0257 10/02/17 0430  AST 94*  --  50*  ALT 63  --   42  ALKPHOS 64  --  62  BILITOT 0.9  --  0.8  ALBUMIN 2.7* 2.5* 2.3*    Cardiac Enzymes Recent Labs  Lab 10/03/17 0918 10/03/17 1525 10/03/17 2226  TROPONINI 0.17* 0.16* 0.15*    Glucose Recent Labs  Lab 10/03/17 0421 10/03/17 0755 10/03/17 1150 10/03/17 1637 10/03/17 1917 10/04/17 0341  GLUCAP 190* 208* 179* 188* 219* 216*    Imaging  5/1 CT ABD >> Uncomplicated cholelithiasis. Nonobstructing left lower pole renal calculus. No hydroureteronephrosis. Stable right double pigtail ureteral stent in place with decompressed bladder containing a Foley catheter. Patchy airspace opacities at each lung base, left greater than right compatible with atelectasis. Minimal superimposed pneumonia is not excluded given history of fever and sepsis. Thoracolumbar spondylosis. EEG 5/3 >> moderate to severe generalized nonspecific cerebral dysfunction.  Presence of triphasic waves can be suggestive of a toxic metabolic encephalopathy. No seizures.  ECHO 5/4 >>   CULTURES: 5/1 BCx 2 >> MRSA >>             E-Coli >> 5/1 UC >> greater 100k E-Coli >>          greater 100k klebsiella pneumoniae >> 5/1 BCID >> MRSA detected & e-coli 5/5 C-Diff >>  ANTIBIOTICS: Zosyn 5/1 >> 5/2 Vancomycin 5/1 >>  Ceftriaxone 5/3 >>  SIGNIFICANT EVENTS: 5/01 transfer from [facility] ED to [facility] admitted  5/03 Bicarb gtt, low dose pressors, port removed, Tmax 101.7, 8L+ positive for admit 5/04 Weaning on PSV  LINES/TUBES: Port in Right chest 04/2015 >> 5/3 Chronic indwelling foley, changed 5/2 >> R femoral CVL TL 5/1 >> 5/4 ETT 5/2 >>  DISCUSSION: 57 yo M presenting with fever and AMS one day after having chronic foley exchanged.  Transferred from [facility] ER for septic shock.  Intubated 5/2 for airway protection.    ASSESSMENT / PLAN:  PULMONARY A: At risk for respiratory insufficiency with acute encephalopathy  Sleep apnea P:   PSV wean, hopeful for extubation  Follow CXR  O2 for sats > 90% Will need CPAP  post extubation   CARDIOVASCULAR A:  Shock - septic, troponin negative  Bradycardia  Hx systolic HF (EF 69-79% in 03/2016), Aflutter on Xarelto, HTN, HLD P:  ICU monitoring  Await ECHO Heparin gtt per pharmacy  Hold home xarelto   RENAL A:   AKI on CKD - sCr 3.48 (up from 1.54 on 3/15) R ureteral stent - placed 3/1, no evidence of obstruction/hydro on CT Chronic foley  Non -  Gap acidosis HypoMag  P:   LR to KVO  Lasix 40 mg BID x2  Trend BMP / urinary output Replace electrolytes as indicated Avoid nephrotoxic agents, ensure adequate renal perfusion  GASTROINTESTINAL A:   Obesity  P:   NPO  Pepcid for SUP  TF per Nutrition   HEMATOLOGIC A:   Anemia - chronic  Thrombocytopenia Elevated INR Coagulated on Xarelto Hx multiple myeloma - previously on maintenance with lenalidomide and dexamethasone - currently off due to recurrent severe infections P:  Trend CBC  Heparin gtt per pharmacy  Monitor for bleeding   INFECTIOUS A:   Urosepsis  MRSA bacteremia E Coli bacteremia Fever - in setting of bacteremia P:   Appreciate ID  WBC rising, C-Diff assessed per ID  Follow cultures, await sensitivities  Continue abx  Consider repeat cultures 5/6 to ensure clearance   ENDOCRINE A:   DM P:   SSI, resistant scale  Levemir 10 units QD   NEUROLOGIC A:   Acute encephalopathy related infection Polymyoclonus, R/O Seizure - EEG negative 5/3 Chronic debilitation  P:   Follow neuro exam  Minimize sedation  PT efforts  Appreciate Neurology input > abx changed with concern for myoclonus    FAMILY  - Updates: No family at bedside am 5/5. Wife, [name] contact info: [phone] or [phone]).     CC Time: 30 minutes    [name], NP-C [facility] Pgr: [phone] or if no answer [phone] 10/04/2017, 7:12 AM

## 2017-10-04 NOTE — Progress Notes (Signed)
Bedside swallow eval for pt. 4 hrs post extubation.  Patient has strong cough and clear voice.  Pt. Able to ingest ice chips with no problems, no s/s of distress or aspiration, capable ability to clear throat.  Same outcome as above when given sips of water from a cup and with use of a straw.  Will start pt. on a clear liquid diet as ordered.

## 2017-10-04 NOTE — Progress Notes (Signed)
I came to do the echo but the heart rate at 124 bpm is too high.

## 2017-10-04 NOTE — Progress Notes (Signed)
Patient ID: [name], male   DOB: [birth year], 57 y.o.   MRN: [id number]          [facility]    Date of Admission:  09/30/2017    Day 6 vancomycin        Day 2 ceftriaxone  He was extubated this morning.  He remains afebrile.  Both admission blood cultures grew E. coli and MRSA.  Repeat blood cultures are negative at 48 hours.  His urine culture grew E. coli and Klebsiella.  TTE is pending.  I will continue vancomycin and change ceftriaxone to cefazolin.        Michel Bickers, MD [facility] [phone] pager   [phone] cell 10/04/2017, 1:41 PM

## 2017-10-04 NOTE — Progress Notes (Signed)
ANTICOAGULATION CONSULT NOTE - Follow Up Consult  Pharmacy Consult for heparin Indication: atrial fibrillation  Labs: Recent Labs    10/02/17 0430  10/02/17 1334 10/03/17 0228 10/03/17 0918 10/03/17 1525 10/03/17 2226 10/04/17 0354 10/04/17 0729 10/04/17 1154  HGB 9.1*  --   --  9.1*  --   --   --  10.5*  --   --   HCT 27.6*  --   --  26.5*  --   --   --  31.6*  --   --   PLT 107*  --   --  73*  --   --   --  85*  --   --   APTT 66*  --   --  52* 37*  --   --  41*  --  47*  LABPROT  --   --   --  18.4*  --   --   --   --   --   --   INR  --   --   --  1.55  --   --   --   --   --   --   HEPARINUNFRC  --    < >  --  0.83*  --  0.47  --  0.77*  --  0.84*  CREATININE 2.80*  --  2.41* 2.18*  --   --   --   --  2.02*  --   TROPONINI  --   --   --   --  0.17* 0.16* 0.15*  --   --   --    < > = values in this interval not displayed.    Assessment: CC/HPI: septic shock 2nd to UTI. Recent ureteral stent placement.   PMH: fourniers, afib  Anticoag: Xarelto PTA for Aflutter, switched to IV heparin PTA HL 0.84, aPTT 47 (not correlating, but HL going up, after being as low at 0.47, likely no more affects from riva. so will stop checking aPTT)  Renal: SCr 2.02   Heme/Onc: H/H 9.1/26.5, Plt 73  Goal of Therapy:  Heparin level 0.3-0.7 units/ml   Plan: Decrease heparin to 1500 units/hr 2000 HL Daily HL CBC  Levester Fresh, PharmD, BCPS, BCCCP Clinical Pharmacist Clinical phone for 10/04/2017 from 0830 - 2100: [phone] If after 2100, please call main pharmacy at: [phone] 10/04/2017 1:39 PM

## 2017-10-04 NOTE — Progress Notes (Signed)
ANTICOAGULATION CONSULT NOTE - Follow Up Consult  Pharmacy Consult for heparin Indication: atrial fibrillation  Labs: Recent Labs    10/02/17 0430  10/02/17 1334 10/03/17 0228 10/03/17 0918 10/03/17 1525 10/03/17 2226 10/04/17 0354  HGB 9.1*  --   --  9.1*  --   --   --   --   HCT 27.6*  --   --  26.5*  --   --   --   --   PLT 107*  --   --  73*  --   --   --   --   APTT 66*  --   --  52* 37*  --   --  41*  LABPROT  --   --   --  18.4*  --   --   --   --   INR  --   --   --  1.55  --   --   --   --   HEPARINUNFRC  --    < >  --  0.83*  --  0.47  --  0.77*  CREATININE 2.80*  --  2.41* 2.18*  --   --   --   --   TROPONINI  --   --   --   --  0.17* 0.16* 0.15*  --    < > = values in this interval not displayed.    Assessment: 57yo male on heparin for afib -heparin level slightly above goal this am  Goal of Therapy:  Heparin level 0.3-0.7 units/ml   Plan: -Dec heparin to 1700 units/hr -Check heparin level in 6 hours -Daily heparin level and CBC  Excell Seltzer, PharmD Clinical Pharmacist 10/04/2017 4:50 AM

## 2017-10-04 NOTE — Progress Notes (Signed)
ANTICOAGULATION CONSULT NOTE - Follow Up Consult  Pharmacy Consult for heparin Indication: atrial fibrillation  Labs: Recent Labs    10/02/17 0430  10/02/17 1334 10/03/17 0228 10/03/17 0918 10/03/17 1525 10/03/17 2226 10/04/17 0354 10/04/17 0729 10/04/17 1154 10/04/17 2039  HGB 9.1*  --   --  9.1*  --   --   --  10.5*  --   --   --   HCT 27.6*  --   --  26.5*  --   --   --  31.6*  --   --   --   PLT 107*  --   --  73*  --   --   --  85*  --   --   --   APTT 66*  --   --  52* 37*  --   --  41*  --  47*  --   LABPROT  --   --   --  18.4*  --   --   --   --   --   --   --   INR  --   --   --  1.55  --   --   --   --   --   --   --   HEPARINUNFRC  --    < >  --  0.83*  --  0.47  --  0.77*  --  0.84* 1.06*  CREATININE 2.80*  --  2.41* 2.18*  --   --   --   --  2.02*  --   --   TROPONINI  --   --   --   --  0.17* 0.16* 0.15*  --   --   --   --    < > = values in this interval not displayed.    Assessment: CC/HPI: septic shock 2nd to UTI. Recent ureteral stent placement.   PMH: fourniers, afib  Anticoag: Xarelto PTA for Aflutter, switched to IV heparin PTA -heparin level = 1.06 after decrease to 1500 units/hr   Goal of Therapy:  Heparin level 0.3-0.7 units/ml   Plan: -Hold heparin for 30 minutes then decrease to 1250 units/hr -Heparin level in 6 hours and daily wth CBC daily  Hildred Laser, PharmD Clinical Pharmacist 10/04/2017 10:28 PM

## 2017-10-05 ENCOUNTER — Inpatient Hospital Stay (HOSPITAL_COMMUNITY): Payer: BLUE CROSS/BLUE SHIELD

## 2017-10-05 ENCOUNTER — Encounter (HOSPITAL_COMMUNITY): Payer: Self-pay | Admitting: Surgery

## 2017-10-05 DIAGNOSIS — N493 Fournier gangrene: Secondary | ICD-10-CM

## 2017-10-05 LAB — BASIC METABOLIC PANEL
Anion gap: 11 (ref 5–15)
BUN: 57 mg/dL — ABNORMAL HIGH (ref 6–20)
CO2: 21 mmol/L — ABNORMAL LOW (ref 22–32)
Calcium: 8.2 mg/dL — ABNORMAL LOW (ref 8.9–10.3)
Chloride: 116 mmol/L — ABNORMAL HIGH (ref 101–111)
Creatinine, Ser: 1.59 mg/dL — ABNORMAL HIGH (ref 0.61–1.24)
GFR calc Af Amer: 54 mL/min — ABNORMAL LOW (ref >60)
GFR calc non Af Amer: 46 mL/min — ABNORMAL LOW (ref >60)
Glucose, Bld: 130 mg/dL — ABNORMAL HIGH (ref 65–99)
Potassium: 3.4 mmol/L — ABNORMAL LOW (ref 3.5–5.1)
Sodium: 148 mmol/L — ABNORMAL HIGH (ref 135–145)

## 2017-10-05 LAB — CBC
HCT: 28.2 % — ABNORMAL LOW (ref 39.0–52.0)
Hemoglobin: 9.1 g/dL — ABNORMAL LOW (ref 13.0–17.0)
MCH: 29.4 pg (ref 26.0–34.0)
MCHC: 32.3 g/dL (ref 30.0–36.0)
MCV: 91.3 fL (ref 78.0–100.0)
Platelets: 115 10*3/uL — ABNORMAL LOW (ref 150–400)
RBC: 3.09 MIL/uL — ABNORMAL LOW (ref 4.22–5.81)
RDW: 17.7 % — ABNORMAL HIGH (ref 11.5–15.5)
WBC: 17.4 10*3/uL — ABNORMAL HIGH (ref 4.0–10.5)

## 2017-10-05 LAB — CULTURE, BLOOD (ROUTINE X 2): Special Requests: ADEQUATE

## 2017-10-05 LAB — HEPARIN LEVEL (UNFRACTIONATED)
Heparin Unfractionated: 0.81 IU/mL — ABNORMAL HIGH (ref 0.30–0.70)
Heparin Unfractionated: 0.67 IU/mL (ref 0.30–0.70)
Heparin Unfractionated: 0.35 IU/mL (ref 0.30–0.70)

## 2017-10-05 LAB — GLUCOSE, CAPILLARY
Glucose-Capillary: 135 mg/dL — ABNORMAL HIGH (ref 65–99)
Glucose-Capillary: 145 mg/dL — ABNORMAL HIGH (ref 65–99)
Glucose-Capillary: 197 mg/dL — ABNORMAL HIGH (ref 65–99)
Glucose-Capillary: 172 mg/dL — ABNORMAL HIGH (ref 65–99)
Glucose-Capillary: 136 mg/dL — ABNORMAL HIGH (ref 65–99)
Glucose-Capillary: 111 mg/dL — ABNORMAL HIGH (ref 65–99)

## 2017-10-05 MED ORDER — ORAL CARE MOUTH RINSE
15.0000 mL | Freq: Two times a day (BID) | OROMUCOSAL | Status: DC
  Administered 2017-10-05 – 2017-10-08 (×7): 15 mL via OROMUCOSAL

## 2017-10-05 MED ORDER — ACYCLOVIR 400 MG PO TABS
400.0000 mg | ORAL_TABLET | Freq: Every morning | ORAL | Status: DC
  Administered 2017-10-05 – 2017-10-08 (×4): 400 mg via ORAL
  Filled 2017-10-05 (×4): qty 1

## 2017-10-05 MED ORDER — FUROSEMIDE 10 MG/ML IJ SOLN
40.0000 mg | Freq: Two times a day (BID) | INTRAMUSCULAR | Status: DC
  Administered 2017-10-06: 40 mg via INTRAVENOUS
  Filled 2017-10-05: qty 4

## 2017-10-05 MED ORDER — GABAPENTIN 300 MG PO CAPS: 300.0000 mg | ORAL_CAPSULE | Freq: Two times a day (BID) | ORAL | Status: DC

## 2017-10-05 MED ORDER — SODIUM CHLORIDE 0.45 % IV SOLN
INTRAVENOUS | Status: DC
  Administered 2017-10-05: 12:00:00 via INTRAVENOUS

## 2017-10-05 MED ORDER — ACETAMINOPHEN 325 MG PO TABS
650.0000 mg | ORAL_TABLET | Freq: Four times a day (QID) | ORAL | Status: DC | PRN
  Administered 2017-10-05: 650 mg via ORAL
  Filled 2017-10-05: qty 2

## 2017-10-05 MED ORDER — GABAPENTIN 100 MG PO CAPS
100.0000 mg | ORAL_CAPSULE | Freq: Two times a day (BID) | ORAL | Status: DC
  Administered 2017-10-05 – 2017-10-08 (×7): 100 mg via ORAL
  Filled 2017-10-05 (×7): qty 1

## 2017-10-05 MED ORDER — OXYCODONE HCL 5 MG PO TABS
5.0000 mg | ORAL_TABLET | ORAL | Status: DC | PRN
  Administered 2017-10-05 – 2017-10-08 (×7): 10 mg via ORAL
  Filled 2017-10-05 (×7): qty 2

## 2017-10-05 MED ORDER — PANTOPRAZOLE SODIUM 40 MG PO TBEC
40.0000 mg | DELAYED_RELEASE_TABLET | Freq: Every day | ORAL | Status: DC
  Administered 2017-10-05 – 2017-10-08 (×4): 40 mg via ORAL
  Filled 2017-10-05 (×4): qty 1

## 2017-10-05 MED ORDER — FENTANYL CITRATE (PF) 100 MCG/2ML IJ SOLN: 25.0000 ug | INTRAMUSCULAR | Status: DC | PRN

## 2017-10-05 MED ORDER — ATORVASTATIN CALCIUM 80 MG PO TABS
80.0000 mg | ORAL_TABLET | Freq: Every day | ORAL | Status: DC
  Administered 2017-10-05 – 2017-10-07 (×3): 80 mg via ORAL
  Filled 2017-10-05 (×3): qty 1

## 2017-10-05 MED ORDER — ALLOPURINOL 300 MG PO TABS
300.0000 mg | ORAL_TABLET | Freq: Every day | ORAL | Status: DC
  Administered 2017-10-05 – 2017-10-08 (×4): 300 mg via ORAL
  Filled 2017-10-05 (×4): qty 1

## 2017-10-05 NOTE — Care Management Note (Addendum)
Case Management Note  Patient Details  Name: [name] MRN: [id number] Date of Birth: December 07, 1960  Subjective/Objective:  History of OSA, Morbid obesity, CAD, HTN, Gout, GERD, Dm, Multiple myeloma, CKD, Aflutter, CHF (EF 35%), Anemia; Admitted for Septic Shock Secondary to UTI.  PCP noted.          Action/Plan: Blood Cx positive for E Coli/MRSA. PTA on Xarelto, on hold.  Extubated on 5/5.  Prior to admission patient lived at home with wife.  NCM will continue to follow for discharge transition needs.  Expected Discharge Date:  10/07/17               Expected Discharge Plan:   To Be Determined.  In-House Referral:  Clinical Social Work  Ship broker  CM Consult  Post Acute Care Choice:    Choice offered to:     DME Arranged:    DME Agency:     HH Arranged:    HH Agency:     Status of Service:  In process, will continue to follow  If discussed at Long Length of Stay Meetings, dates discussed:    Additional Comments:  Kristen Cardinal, RN 10/05/2017, 9:41 AM

## 2017-10-05 NOTE — Progress Notes (Signed)
PT Cancellation Note  Patient Details Name: [name] MRN: [id number] DOB: December 09, 1960   Cancelled Treatment:    Reason Eval/Treat Not Completed: Other (comment)   Second attempt at mobility/PT eval; Declining still; Will continue to follow;   Roney Marion, [place] Pager [phone] Office [place] 10/05/2017, 10:30 AM

## 2017-10-05 NOTE — Progress Notes (Signed)
[place] TEAM 1 - Stepdown/ICU TEAM  LADARRYL WRAGE  GYB:[id number] DOB: 07-Jun-1960 DOA: 09/30/2017 PCP: [name], MD    Brief Narrative:  57 year old male with hx of morbid obesity, multiple myeloma on chronic steroids, systolic CHF, HTN, GERD, aflutter on Xarelto, CKD stage 3, anemia, DM, and chronic foley who presented to [facility] ER with altered mental status.  He required tx for Fournier's gangrene 04/2017.  Returned home from rehab in December.  Klebsiella bacteremia from obstructing ureteral stone with ureteral stent placement 3/1.  Home health exchanged foley on 4/30 and afterwards his wife noted altered mental status, decreased PO intake, and fever.   On arrival to ER he was febrile, minimally responsive, and hypotensive.  Labs noted for WBC 21.4, Cr 3.48 (up from 1.54 on 3/15), lactic 3.77.    Significant Events: 5/01 transfer from [facility] to [facility] 5/2 intubated - chronic foley changed 5/03 Bicarb gtt, low dose pressors, R chest port removed, Tmax 101.7, 8L+ positive for admit 5/04 Weaning on PSV 5/5 extubated   Subjective: The patient is awake and alert.  He reports some pain in his groin related to his wounds.  He denies chest pain or shortness of breath.  He has been hesitant to work with physical therapy and I have encouraged him and explained the importance of doing this to him.  Assessment & Plan:  Septic shock - E coli bacteremia - E coli and Kleb pneumoniae Emphysematous pyelitis Continue directed antibiotic therapy as per recommendations of ID  Recent Fournier's has wound in scrotum -wound care team to follow  Acute respiratory failure with hypoxemia Extubated 5/5 -clinically stable at this time  MRSA bacteremia Continue antibiotic therapy as per ID  Polymyoclonus Resolved off meropenem and cipro - EEG unremarkable   AKI on CKD  Baseline crt 1.54 - essentially back to his baseline   Recent Labs  Lab 10/02/17 0430 10/02/17 1334 10/03/17 0228  10/04/17 0729 10/05/17 0546  CREATININE 2.80* 2.41* 2.18* 2.02* 1.59*   R ureteral stent  Placed 07/31/17  Chronic foley cath  Changed during this hospital stay  Hypernatremia  Encourage free water intake and follow trend  DM  CBG well controlled at this time  Multiple Myeloma  previously on maintenance with lenalidomide and dexamethasone - currently off due to recurrent severe infections  Chronic systolic CHF  EF 87-68% via TTE Oct 2017 -no significant volume overload on clinical exam presently  Chronic A flutter On Xarelto as outpt - cont heparin gtt for now -in normal sinus rhythm to auscultation this morning  Sleep apnea Continue nightly CPAP routine as per home  Morbid obesity - Body mass index is 44.42 kg/m.  DVT prophylaxis: IV heparin Code Status: FULL CODE Family Communication: no family present at time of exam  Disposition Plan: SDU  Consultants:  ID PCCM Neurology   Antimicrobials:  Zosyn 5/1 > 5/2 Cipro 5/2 > 5/3 Vancomycin 5/1 >  Ceftriaxone 5/3 > 5/4 Cefazolin 5/5 >  Objective: Blood pressure 114/61, pulse 84, temperature (!) 101.3 F (38.5 C), resp. rate (!) 21, height 6' 2" (1.88 m), weight (!) 156.9 kg (346 lb), SpO2 99 %.  Intake/Output Summary (Last 24 hours) at 10/05/2017 1019 Last data filed at 10/05/2017 1000 Gross per 24 hour  Intake 1802.17 ml  Output 3309 ml  Net -1506.83 ml   Filed Weights   10/03/17 0500 10/04/17 0500 10/05/17 0500  Weight: (!) 160.3 kg (353 lb 6.4 oz) (!) 157.7 kg (  347 lb 10.7 oz) (!) 156.9 kg (346 lb)    Examination: General: No acute respiratory distress Lungs: Clear to auscultation bilaterally without wheezes or crackles - distant BS Cardiovascular: Regular rate and rhythm without murmur gallop or rub normal S1 and S2 - distant HS Abdomen: Nontender, obese, soft, bowel sounds positive, no rebound, no ascites, no appreciable mass Extremities: trace B LE edema   CBC: Recent Labs  Lab 09/30/17 1732   10/03/17 0228 10/04/17 0354 10/05/17 0546  WBC 21.4*   < > 33.0* 39.0* 17.4*  NEUTROABS 19.2  --   --   --   --   HGB 10.6*   < > 9.1* 10.5* 9.1*  HCT 32.9*   < > 26.5* 31.6* 28.2*  MCV 93.7   < > 89.2 89.8 91.3  PLT 104*   < > 73* 85* 115*   < > = values in this interval not displayed.   Basic Metabolic Panel: Recent Labs  Lab 10/01/17 0257  10/02/17 1546 10/03/17 0228 10/04/17 0729 10/05/17 0546  NA 134*   < >  --  141 145 148*  K 4.9   < >  --  3.7 4.2 3.4*  CL 106   < >  --  111 112* 116*  CO2 18*   < >  --  19* 19* 21*  GLUCOSE 126*   < >  --  207* 236* 130*  BUN 50*   < >  --  56* 70* 57*  CREATININE 3.44*   < >  --  2.18* 2.02* 1.59*  CALCIUM 7.6*   < >  --  7.3* 7.9* 8.2*  MG 1.4*  --  2.1 2.2 2.4  --   PHOS 3.3  3.2  --  2.6 2.7  --   --    < > = values in this interval not displayed.   GFR: Estimated Creatinine Clearance: 80.3 mL/min (A) (by C-G formula based on SCr of 1.59 mg/dL (H)).  Liver Function Tests: Recent Labs  Lab 09/30/17 1732 10/01/17 0257 10/02/17 0430  AST 94*  --  50*  ALT 63  --  42  ALKPHOS 64  --  62  BILITOT 0.9  --  0.8  PROT 6.1*  --  5.6*  ALBUMIN 2.7* 2.5* 2.3*    Recent Labs  Lab 10/03/17 0228  AMMONIA 23    Coagulation Profile: Recent Labs  Lab 09/30/17 1855 10/01/17 0257 10/03/17 0228  INR 2.86 2.79 1.55    Cardiac Enzymes: Recent Labs  Lab 10/01/17 1219 10/01/17 2114 10/03/17 0918 10/03/17 1525 10/03/17 2226  TROPONINI 0.16* 0.16* 0.17* 0.16* 0.15*    HbA1C: Hgb A1c MFr Bld  Date/Time Value Ref Range Status  04/02/2017 02:19 PM 8.2 (H) 4.8 - 5.6 % Final    Comment:    (NOTE) Pre diabetes:          5.7%-6.4% Diabetes:              >6.4% Glycemic control for   <7.0% adults with diabetes   09/24/2011 12:00 AM 9.2 (H) <5.7 % Final    Comment:    (NOTE)  According to the ADA Clinical Practice Recommendations for 2011,  when HbA1c is used as a screening test:  >=6.5%   Diagnostic of Diabetes Mellitus           (if abnormal result is confirmed) 5.7-6.4%   Increased risk of developing Diabetes Mellitus References:Diagnosis and Classification of Diabetes Mellitus,Diabetes Care,2011,34(Suppl 1):S62-S69 and Standards of Medical Care in         Diabetes - 2011,Diabetes Care,2011,34 (Suppl 1):S11-S61.    CBG: Recent Labs  Lab 10/04/17 1610 10/04/17 2024 10/04/17 2319 10/05/17 0405 10/05/17 0744  GLUCAP 175* 163* 144* 135* 145*    Recent Results (from the past 240 hour(s))  Culture, blood (Routine x 2)     Status: Abnormal   Collection Time: 09/30/17  5:32 PM  Result Value Ref Range Status   Specimen Description   Final    RIGHT ANTECUBITAL Performed at [facility], [address] [place], [place] [address]    Special Requests   Final    BOTTLES DRAWN AEROBIC AND ANAEROBIC Blood Culture adequate volume Performed at [facility], [address] [place], [place] [address]    Culture  Setup Time   Final    GRAM NEGATIVE RODS ANAEROBIC BOTTLE Gram Stain Report Called to,Read Back By and Verified With: FIELDS S. AT [facility] AT 0920A ON [id number] BY THOMPSON S. GRAM POSITIVE COCCI IN CLUSTERS RECOVERED FROM THE AEROBIC BOTTLE Gram Stain Report Called to,Read Back By and Verified With: JOHNSON,J. AT 1258 ON 10/01/2017 BY BAUGHAM,M. Performed at [facility] CRITICAL VALUE NOTED.  VALUE IS CONSISTENT WITH PREVIOUSLY REPORTED AND CALLED VALUE. Performed at [facility], [address] , [place] [address]    Culture (A)  Final    METHICILLIN RESISTANT STAPHYLOCOCCUS AUREUS ESCHERICHIA COLI    Report Status 10/03/2017 FINAL  Final   Organism ID, Bacteria METHICILLIN RESISTANT STAPHYLOCOCCUS AUREUS  Final   Organism ID, Bacteria ESCHERICHIA COLI  Final      Susceptibility   Escherichia coli - MIC*    AMPICILLIN >=32 RESISTANT Resistant     CEFAZOLIN <=4 SENSITIVE Sensitive     CEFEPIME  <=1 SENSITIVE Sensitive     CEFTAZIDIME <=1 SENSITIVE Sensitive     CEFTRIAXONE <=1 SENSITIVE Sensitive     CIPROFLOXACIN >=4 RESISTANT Resistant     GENTAMICIN 8 INTERMEDIATE Intermediate     IMIPENEM <=0.25 SENSITIVE Sensitive     TRIMETH/SULFA >=320 RESISTANT Resistant     AMPICILLIN/SULBACTAM >=32 RESISTANT Resistant     PIP/TAZO <=4 SENSITIVE Sensitive     Extended ESBL NEGATIVE Sensitive     * ESCHERICHIA COLI   Methicillin resistant staphylococcus aureus - MIC*    CIPROFLOXACIN >=8 RESISTANT Resistant     ERYTHROMYCIN >=8 RESISTANT Resistant     GENTAMICIN <=0.5 SENSITIVE Sensitive     OXACILLIN >=4 RESISTANT Resistant     TETRACYCLINE >=16 RESISTANT Resistant     VANCOMYCIN 1 SENSITIVE Sensitive     TRIMETH/SULFA >=320 RESISTANT Resistant     CLINDAMYCIN >=8 RESISTANT Resistant     RIFAMPIN <=0.5 SENSITIVE Sensitive     Inducible Clindamycin NEGATIVE Sensitive     * METHICILLIN RESISTANT STAPHYLOCOCCUS AUREUS  Urine culture     Status: Abnormal   Collection Time: 09/30/17  5:32 PM  Result Value Ref Range Status   Specimen Description   Final    URINE, RANDOM Performed at [facility], [address] [place], [place] [address]    Special Requests   Final      NONE Performed at [facility], [address], [place], [place] [address]    Culture (A)  Final    >=100,000 COLONIES/mL ESCHERICHIA COLI >=100,000 COLONIES/mL KLEBSIELLA PNEUMONIAE    Report Status 10/04/2017 FINAL  Final   Organism ID, Bacteria ESCHERICHIA COLI (A)  Final   Organism ID, Bacteria KLEBSIELLA PNEUMONIAE (A)  Final      Susceptibility   Escherichia coli - MIC*    AMPICILLIN >=32 RESISTANT Resistant     CEFAZOLIN <=4 SENSITIVE Sensitive     CEFTRIAXONE <=1 SENSITIVE Sensitive     CIPROFLOXACIN >=4 RESISTANT Resistant     GENTAMICIN 4 SENSITIVE Sensitive     IMIPENEM <=0.25 SENSITIVE Sensitive     NITROFURANTOIN <=16 SENSITIVE Sensitive     TRIMETH/SULFA >=320 RESISTANT Resistant      AMPICILLIN/SULBACTAM >=32 RESISTANT Resistant     PIP/TAZO <=4 SENSITIVE Sensitive     Extended ESBL NEGATIVE Sensitive     * >=100,000 COLONIES/mL ESCHERICHIA COLI   Klebsiella pneumoniae - MIC*    AMPICILLIN >=32 RESISTANT Resistant     CEFAZOLIN <=4 SENSITIVE Sensitive     CEFTRIAXONE <=1 SENSITIVE Sensitive     CIPROFLOXACIN 2 INTERMEDIATE Intermediate     GENTAMICIN <=1 SENSITIVE Sensitive     IMIPENEM <=0.25 SENSITIVE Sensitive     NITROFURANTOIN 128 RESISTANT Resistant     TRIMETH/SULFA >=320 RESISTANT Resistant     AMPICILLIN/SULBACTAM >=32 RESISTANT Resistant     PIP/TAZO 16 SENSITIVE Sensitive     Extended ESBL NEGATIVE Sensitive     * >=100,000 COLONIES/mL KLEBSIELLA PNEUMONIAE  Culture, blood (Routine x 2)     Status: Abnormal   Collection Time: 09/30/17  5:37 PM  Result Value Ref Range Status   Specimen Description   Final    RIGHT ANTECUBITAL Performed at [facility], [address], [place], [place] [address]    Special Requests   Final    BOTTLES DRAWN AEROBIC AND ANAEROBIC Blood Culture adequate volume Performed at [facility], [address], [place], [place] [address]    Culture  Setup Time   Final    GRAM NEGATIVE RODS ANAEROBIC BOTTLE ONLY Gram Stain Report Called to,Read Back By and Verified With: FIELDS S. AT [place] AT 0920A ON [id number] BY THOMPSON S. GRAM POSITIVE COCCI IN CLUSTERS AEROBIC BOTTLE ONLY Gram Stain Report Called to,Read Back By and Verified With: JOHNSON,J. AT 1258 ON 10/01/2017 BY BAUGHAM,M. Performed at [facility]    Culture (A)  Final    STAPHYLOCOCCUS AUREUS SUSCEPTIBILITIES PERFORMED ON PREVIOUS CULTURE WITHIN THE LAST 5 DAYS. ESCHERICHIA COLI    Report Status 10/05/2017 FINAL  Final  Blood Culture ID Panel (Reflexed)     Status: Abnormal   Collection Time: 09/30/17  5:37 PM  Result Value Ref Range Status   Enterococcus species NOT DETECTED NOT DETECTED Final   Listeria monocytogenes NOT DETECTED NOT DETECTED Final    Staphylococcus species DETECTED (A) NOT DETECTED Final    Comment: CRITICAL RESULT CALLED TO, READ BACK BY AND VERIFIED WITH: Asencion Islam PharmD 16:05 10/01/17 (wilsonm)    Staphylococcus aureus DETECTED (A) NOT DETECTED Final    Comment: Methicillin (oxacillin)-resistant Staphylococcus aureus (MRSA). MRSA is predictably resistant to beta-lactam antibiotics (except ceftaroline). Preferred therapy is vancomycin unless clinically contraindicated. Patient requires contact precautions if  hospitalized. CRITICAL RESULT CALLED TO, READ BACK BY AND VERIFIED WITH: Asencion Islam PharmD 16:05 10/01/17 (wilsonm)    Methicillin resistance DETECTED (A) NOT DETECTED Final    Comment: CRITICAL  RESULT CALLED TO, READ BACK BY AND VERIFIED WITH: B. Mancheril PharmD 16:05 10/01/17 (wilsonm)    Streptococcus species NOT DETECTED NOT DETECTED Final   Streptococcus agalactiae NOT DETECTED NOT DETECTED Final   Streptococcus pneumoniae NOT DETECTED NOT DETECTED Final   Streptococcus pyogenes NOT DETECTED NOT DETECTED Final   Acinetobacter baumannii NOT DETECTED NOT DETECTED Final   Enterobacteriaceae species DETECTED (A) NOT DETECTED Final    Comment: Enterobacteriaceae represent a large family of gram-negative bacteria, not a single organism. CRITICAL RESULT CALLED TO, READ BACK BY AND VERIFIED WITH: B. Mancheril PharmD 16:05 10/01/17 (wilsonm)    Enterobacter cloacae complex NOT DETECTED NOT DETECTED Final   Escherichia coli DETECTED (A) NOT DETECTED Final    Comment: CRITICAL RESULT CALLED TO, READ BACK BY AND VERIFIED WITH: B. Mancheril PharmD 16:05 10/01/17 (wilsonm)    Klebsiella oxytoca NOT DETECTED NOT DETECTED Final   Klebsiella pneumoniae NOT DETECTED NOT DETECTED Final   Proteus species NOT DETECTED NOT DETECTED Final   Serratia marcescens NOT DETECTED NOT DETECTED Final   Carbapenem resistance NOT DETECTED NOT DETECTED Final   Haemophilus influenzae NOT DETECTED NOT DETECTED Final   Neisseria  meningitidis NOT DETECTED NOT DETECTED Final   Pseudomonas aeruginosa NOT DETECTED NOT DETECTED Final   Candida albicans NOT DETECTED NOT DETECTED Final   Candida glabrata NOT DETECTED NOT DETECTED Final   Candida krusei NOT DETECTED NOT DETECTED Final   Candida parapsilosis NOT DETECTED NOT DETECTED Final   Candida tropicalis NOT DETECTED NOT DETECTED Final    Comment: Performed at [facility], [address] [place], [place] [address]  MRSA PCR Screening     Status: Abnormal   Collection Time: 10/01/17  2:26 AM  Result Value Ref Range Status   MRSA by PCR POSITIVE (A) NEGATIVE Final    Comment:        The GeneXpert MRSA Assay (FDA approved for NASAL specimens only), is one component of a comprehensive MRSA colonization surveillance program. It is not intended to diagnose MRSA infection nor to guide or monitor treatment for MRSA infections. RESULT CALLED TO, READ BACK BY AND VERIFIED WITH: B SHEPARD RN 10/01/17 0438 JDW   Culture, blood (routine x 2)     Status: None (Preliminary result)   Collection Time: 10/02/17  6:00 AM  Result Value Ref Range Status   Specimen Description BLOOD LEFT HAND  Final   Special Requests   Final    BOTTLES DRAWN AEROBIC ONLY Blood Culture adequate volume   Culture   Final    NO GROWTH 2 DAYS Performed at [facility], [address] [place], [place] [address]    Report Status PENDING  Incomplete  Culture, blood (routine x 2)     Status: None (Preliminary result)   Collection Time: 10/02/17  6:14 AM  Result Value Ref Range Status   Specimen Description BLOOD RIGHT HAND  Final   Special Requests   Final    BOTTLES DRAWN AEROBIC ONLY Blood Culture adequate volume   Culture   Final    NO GROWTH 2 DAYS Performed at [facility], [address] [place], [place] [address]    Report Status PENDING  Incomplete  C difficile quick scan w PCR reflex     Status: None   Collection Time: 10/04/17  6:00 PM  Result Value Ref Range  Status   C Diff antigen NEGATIVE NEGATIVE Final   C Diff toxin NEGATIVE NEGATIVE Final     C Diff interpretation No C. difficile detected.  Final    Comment: Performed at [facility], [place] [address], [place], [place] [address]     Scheduled Meds: . Chlorhexidine Gluconate Cloth  6 each Topical Q0600  . furosemide  40 mg Intravenous BID  . insulin aspart  0-20 Units Subcutaneous TID WC  . insulin aspart  0-5 Units Subcutaneous QHS  . insulin detemir  10 Units Subcutaneous Daily  . mouth rinse  15 mL Mouth Rinse BID  . mupirocin ointment  1 application Nasal BID     LOS: 5 days   Cherene Altes, MD [facility] Office  [phone] Pager - Text Page per Amion as per below:  On-Call/Text Page:      [email or web address]      password [email or web address]  If 7PM-7AM, please contact night-coverage [email or web address] Password [email or web address] 10/05/2017, 10:19 AM

## 2017-10-05 NOTE — Progress Notes (Signed)
PT Cancellation Note  Patient Details Name: [name] MRN: [id number] DOB: Oct 13, 1960   Cancelled Treatment:    Reason Eval/Treat Not Completed: Other (comment)   Politely declining PT at this time; in a considerable amount of pain at this time;   Will follow up later today as time allows;  Otherwise, will follow up for PT at a later date;   Thank you,  Roney Marion, PT  Acute Rehabilitation Services Pager [phone] Office [place] 10/05/2017, 9:05 AM

## 2017-10-05 NOTE — Evaluation (Signed)
Physical Therapy Evaluation Patient Details Name: [name] MRN: [id number] DOB: 08/17/1960 Today's Date: 10/05/2017   History of Present Illness  56yoM with hx OSA, Morbid obesity, CAD, HTN, Gout, GERD, Dm, Multiple myeloma, CKD, Aflutter, CHF (EF 35%), Anemia, who had a recent ureteral stent placed and now presents with AMS and Septic shock due to UTI. Port removed; Recovery comlicated by seizures; Extubated 5/6  Clinical Impression   Pt admitted with above diagnosis. Pt currently with functional limitations due to the deficits listed below (see PT Problem List). Presents with generalized weakness, pain, significantly limiting functional mobility; At this point, we must consider SNF stay for rehab to maximize independence and safety with mobility prior to dc home;  Pt will benefit from skilled PT to increase their independence and safety with mobility to allow discharge to the venue listed below.       Follow Up Recommendations SNF;Supervision/Assistance - 24 hour    Equipment Recommendations  3in1 (PT)(Wide, possible drop-arm BSC)    Recommendations for Other Services       Precautions / Restrictions Precautions Precautions: Fall Precaution Comments: Very sore scrotum      Mobility  Bed Mobility Overal bed mobility: Needs Assistance Bed Mobility: Supine to Sit     Supine to sit: +2 for physical assistance;Max assist     General bed mobility comments: Very slow moving, cues for technqiue; +2 assist to move hips/knees to EOB, clear EOB, and elevate trunk to sit  Transfers Overall transfer level: Needs assistance Equipment used: Rolling walker (2 wheeled) Transfers: Sit to/from Stand Sit to Stand: +2 physical assistance;Mod assist         General transfer comment: Heavy mod assist to power up to stand; "stalled" mid-stand, requiring max encouragement and more assist to stay standing and come to as upright as possible  Ambulation/Gait Ambulation/Gait  assistance: Mod assist;+2 physical assistance;+2 safety/equipment Ambulation Distance (Feet): (Pivotal steps bed to chair) Assistive device: Rolling walker (2 wheeled) Gait Pattern/deviations: Shuffle     General Gait Details: Short, shuffling steps with heavy dependence on RW for support  Stairs            Wheelchair Mobility    Modified Rankin (Stroke Patients Only)       Balance Overall balance assessment: Needs assistance Sitting-balance support: Feet supported;Bilateral upper extremity supported Sitting balance-Leahy Scale: Fair       Standing balance-Leahy Scale: Poor                               Pertinent Vitals/Pain Pain Assessment: Faces Faces Pain Scale: Hurts little more Pain Location: scrotal pain Pain Descriptors / Indicators: Aching;Grimacing;Guarding Pain Intervention(s): Monitored during session;Premedicated before session;Repositioned    Home Living Family/patient expects to be discharged to:: Private residence Living Arrangements: Spouse/significant other Available Help at Discharge: Family;Personal care attendant(PCA M-F, wife assist Sat and Sun) Type of Home: House Home Access: Stairs to enter     Home Layout: Multi-level Home Equipment: Walker - 2 wheels;Wheelchair - manual(Hospital Bed) Additional Comments: It sounds like pt has moved to stay mostly on one level in his home; Not very clear as to if he has steps to enter the level where he stays; Tells me he has a hospital bed in the room where he stays    Prior Function Level of Independence: Needs assistance   Gait / Transfers Assistance Needed: with Bari RW; household distances  ADL's / Homemaking Assistance Needed:   Assist from family or personal care attendant; sponge bathes at this point  Comments: Difficult to get participation in mobility and ask questions about home situation in same session as pt was very slow moving     Hand Dominance   Dominant Hand: Right     Extremity/Trunk Assessment   Upper Extremity Assessment Upper Extremity Assessment: Generalized weakness    Lower Extremity Assessment Lower Extremity Assessment: Generalized weakness       Communication   Communication: No difficulties  Cognition Arousal/Alertness: Awake/alert Behavior During Therapy: WFL for tasks assessed/performed;Flat affect(Noted tendency to need control) Overall Cognitive Status: Within Functional Limits for tasks assessed(For simple mobility tasks)                                 General Comments: Moves very slowly, and has difficulty taking suggestions/cues for more efficient movement      General Comments General comments (skin integrity, edema, etc.): VSS throughout session; conducted on Room Air, and O2 sats reamined greater tahn or equal to 95%; HR range 90-127; BP 114/63 sitting EOB    Exercises     Assessment/Plan    PT Assessment Patient needs continued PT services  PT Problem List Decreased strength;Decreased range of motion;Decreased activity tolerance;Decreased balance;Decreased mobility;Decreased coordination;Decreased knowledge of use of DME;Decreased safety awareness;Decreased knowledge of precautions;Cardiopulmonary status limiting activity;Pain;Obesity       PT Treatment Interventions DME instruction;Gait training;Stair training;Functional mobility training;Therapeutic activities;Therapeutic exercise;Balance training;Patient/family education    PT Goals (Current goals can be found in the Care Plan section)  Acute Rehab PT Goals Patient Stated Goal: Would rather go home than to a SNF PT Goal Formulation: With patient Time For Goal Achievement: 10/19/17 Potential to Achieve Goals: Good    Frequency Min 2X/week   Barriers to discharge Other (comment) Need clearer picture of home setup and available assist; at this point, must consider SNF for rehab    Co-evaluation               AM-PAC PT "6 Clicks" Daily  Activity  Outcome Measure Difficulty turning over in bed (including adjusting bedclothes, sheets and blankets)?: Unable Difficulty moving from lying on back to sitting on the side of the bed? : Unable Difficulty sitting down on and standing up from a chair with arms (e.g., wheelchair, bedside commode, etc,.)?: Unable Help needed moving to and from a bed to chair (including a wheelchair)?: A Lot Help needed walking in hospital room?: A Lot Help needed climbing 3-5 steps with a railing? : Total 6 Click Score: 8    End of Session Equipment Utilized During Treatment: Gait belt Activity Tolerance: Patient tolerated treatment well;Other (comment)(very slow moving) Patient left: in chair;with call bell/phone within reach Nurse Communication: Mobility status;Need for lift equipment;Other (comment)(options for pad placement) PT Visit Diagnosis: Other abnormalities of gait and mobility (R26.89);Muscle weakness (generalized) (M62.81)    Time: 1054(Start time is approx)-1136 PT Time Calculation (min) (ACUTE ONLY): 42 min   Charges:   PT Evaluation $PT Eval Moderate Complexity: 1 Mod PT Treatments $Gait Training: 8-22 mins $Therapeutic Activity: 8-22 mins   PT G Codes:        Roney Marion, PT  Acute Rehabilitation Services Pager [phone] Office [place] 10/05/2017, 12:11 PM

## 2017-10-05 NOTE — Consult Note (Addendum)
Lead Nurse wound consult note Reason for Consult: Consult requested for scrotum and sacrum/buttocks.  Pt has generalized edema and patchy areas of partial thickness skin loss to anterior scrotum; appearance is consistent with moisture associated skin damage, red and moist wounds, all .2X.2X.1cm or smaller. He has a flexiseal in place to attempt to contain liquid stool, but it still leaks around the insertion site and is becoming trapped beneath the foam dressing which was previously applied.  Pt declines to turn most of the time related to pain at scrotum and buttocks/sacrum locations. Buttocks/sacrum with moisture associated skin damage and stage 2 pressure injury; patchy affected area is approx 2X2cm, red and moist, small amt yellow drainage. Pressure Injury POA: Yes Plan: leave dressing off, since it is trapping stool beneath the dressing, and apply barrier cream and antifungal powder to sacrum/buttocks to promote drying and healing.  Xeroform gauze to sacrum to promote healing. Pt is on a low air loss mattress to decrease pressure. Encouraged him to allow nurses to turn and clean him. Discussed plan of care with patient and he verbalized understanding. Please re-consult if further assistance is needed.  Thank-you,  Julien Girt MSN, [place], [place], [place], [place]

## 2017-10-05 NOTE — Progress Notes (Signed)
ANTICOAGULATION CONSULT NOTE - Follow Up Consult  Pharmacy Consult for heparin Indication: atrial fibrillation  Labs: Recent Labs    10/02/17 1334  10/03/17 0228 10/03/17 0918 10/03/17 1525 10/03/17 2226 10/04/17 0354 10/04/17 0729 10/04/17 1154 10/04/17 2039 10/05/17 0546  HGB  --   --  9.1*  --   --   --  10.5*  --   --   --   --   HCT  --   --  26.5*  --   --   --  31.6*  --   --   --   --   PLT  --   --  73*  --   --   --  85*  --   --   --   --   APTT  --    < > 52* 37*  --   --  41*  --  47*  --   --   LABPROT  --   --  18.4*  --   --   --   --   --   --   --   --   INR  --   --  1.55  --   --   --   --   --   --   --   --   HEPARINUNFRC  --    < > 0.83*  --  0.47  --  0.77*  --  0.84* 1.06* 0.81*  CREATININE 2.41*  --  2.18*  --   --   --   --  2.02*  --   --   --   TROPONINI  --   --   --  0.17* 0.16* 0.15*  --   --   --   --   --    < > = values in this interval not displayed.    Assessment: CC/HPI: septic shock 2nd to UTI. Recent ureteral stent placement.   PMH: fourniers, afib  Anticoag: Xarelto PTA for Aflutter, switched to IV heparin PTA -heparin level still elevated   Goal of Therapy:  Heparin level 0.3-0.7 units/ml   Plan: -Decrease heparin to 950 units/hr -Heparin level in 6 hours and daily wth CBC daily  [name], PharmD Clinical Pharmacist 10/05/2017 6:51 AM

## 2017-10-05 NOTE — Progress Notes (Signed)
ANTICOAGULATION CONSULT NOTE - Follow Up Consult  Pharmacy Consult for heparin Indication: atrial fibrillation  Labs: Recent Labs    10/03/17 0228 10/03/17 0918 10/03/17 1525 10/03/17 2226 10/04/17 0354 10/04/17 0729 10/04/17 1154  10/05/17 0546 10/05/17 1402 10/05/17 2027  HGB 9.1*  --   --   --  10.5*  --   --   --  9.1*  --   --   HCT 26.5*  --   --   --  31.6*  --   --   --  28.2*  --   --   PLT 73*  --   --   --  85*  --   --   --  115*  --   --   APTT 52* 37*  --   --  41*  --  47*  --   --   --   --   LABPROT 18.4*  --   --   --   --   --   --   --   --   --   --   INR 1.55  --   --   --   --   --   --   --   --   --   --   HEPARINUNFRC 0.83*  --  0.47  --  0.77*  --  0.84*   < > 0.81* 0.67 0.35  CREATININE 2.18*  --   --   --   --  2.02*  --   --  1.59*  --   --   TROPONINI  --  0.17* 0.16* 0.15*  --   --   --   --   --   --   --    < > = values in this interval not displayed.    Assessment: CC/HPI: septic shock 2nd to UTI. Recent ureteral stent placement.   PMH: fourniers, afib  Anticoag: Xarelto PTA for Aflutter, switched to IV heparin PTA -heparin level still  therapeutic at 0.35, no issues with drip per RN. No bleeding noted.    Goal of Therapy:  Heparin level 0.3-0.7 units/ml   Plan: -Maintain heparin at 950 units/hr HL in AM and daily wth CBC daily  Erastus Bartolomei A. Levada Dy, PharmD, [place] Pager: [phone]  10/05/2017 10:37 PM

## 2017-10-05 NOTE — Progress Notes (Signed)
INFECTIOUS DISEASE PROGRESS NOTE  ID: [name] is a 57 y.o. male with  Active Problems:   Urinary tract infection with hematuria   AKI (acute kidney injury) ([facility])   Septic shock (HCC)   Acute encephalopathy   MRSA bacteremia   E. coli bacteremia   Acute respiratory failure with hypoxia (HCC)  Subjective: Awake and alert.  "I just want to rest"  Abtx:  Anti-infectives (From admission, onward)   Start     Dose/Rate Route Frequency Ordered Stop   10/04/17 1400  ceFAZolin (ANCEF) IVPB 2g/100 mL premix     2 g 200 mL/hr over 30 Minutes Intravenous Every 8 hours 10/04/17 1351     10/03/17 0600  vancomycin (VANCOCIN) 2,000 mg in sodium chloride 0.9 % 500 mL IVPB  Status:  Discontinued     2,000 mg 250 mL/hr over 120 Minutes Intravenous Every 48 hours 10/01/17 1352 10/02/17 1340   10/02/17 2000  meropenem (MERREM) 1 g in sodium chloride 0.9 % 100 mL IVPB  Status:  Discontinued     1 g 200 mL/hr over 30 Minutes Intravenous Every 12 hours 10/02/17 1150 10/02/17 1803   10/02/17 2000  cefTRIAXone (ROCEPHIN) 2 g in sodium chloride 0.9 % 100 mL IVPB  Status:  Discontinued     2 g 200 mL/hr over 30 Minutes Intravenous Every 24 hours 10/02/17 1803 10/04/17 1344   10/02/17 1400  vancomycin (VANCOCIN) 1,750 mg in sodium chloride 0.9 % 500 mL IVPB     1,750 mg 250 mL/hr over 120 Minutes Intravenous Every 24 hours 10/02/17 1340     10/02/17 0600  vancomycin (VANCOCIN) 1,750 mg in sodium chloride 0.9 % 500 mL IVPB  Status:  Discontinued     1,750 mg 250 mL/hr over 120 Minutes Intravenous Every 24 hours 10/01/17 1350 10/01/17 1352   10/01/17 2000  ceFEPIme (MAXIPIME) 2 g in sodium chloride 0.9 % 100 mL IVPB  Status:  Discontinued     2 g 200 mL/hr over 30 Minutes Intravenous Every 12 hours 10/01/17 1800 10/02/17 1150   10/01/17 1830  ciprofloxacin (CIPRO) IVPB 400 mg  Status:  Discontinued     400 mg 200 mL/hr over 60 Minutes Intravenous Every 12 hours 10/01/17 1800 10/02/17 1803   10/01/17 0600  vancomycin (VANCOCIN) 1,750 mg in sodium chloride 0.9 % 500 mL IVPB  Status:  Discontinued     1,750 mg 250 mL/hr over 120 Minutes Intravenous Every 24 hours 10/01/17 0536 10/01/17 1255   10/01/17 0600  piperacillin-tazobactam (ZOSYN) IVPB 3.375 g  Status:  Discontinued     3.375 g 12.5 mL/hr over 240 Minutes Intravenous Every 8 hours 10/01/17 0536 10/01/17 1736   09/30/17 1815  vancomycin (VANCOCIN) IVPB 1000 mg/200 mL premix     1,000 mg 200 mL/hr over 60 Minutes Intravenous  Once 09/30/17 1802 09/30/17 1928   09/30/17 1815  piperacillin-tazobactam (ZOSYN) IVPB 3.375 g     3.375 g 100 mL/hr over 30 Minutes Intravenous  Once 09/30/17 1802 09/30/17 1847      Medications:  Scheduled: . Chlorhexidine Gluconate Cloth  6 each Topical Q0600  . furosemide  40 mg Intravenous BID  . insulin aspart  0-20 Units Subcutaneous TID WC  . insulin aspart  0-5 Units Subcutaneous QHS  . insulin detemir  10 Units Subcutaneous Daily  . mouth rinse  15 mL Mouth Rinse BID  . mupirocin ointment  1 application Nasal BID    Objective: Vital signs in last 24  hours: Temp:  [97.3 F (36.3 C)-101.3 F (38.5 C)] 101.3 F (38.5 C) (05/06 0900) Pulse Rate:  [51-89] 84 (05/06 0900) Resp:  [13-26] 21 (05/06 0900) BP: (90-145)/(52-119) 114/61 (05/06 0900) SpO2:  [97 %-100 %] 99 % (05/06 0929) FiO2 (%):  [40 %] 40 % (05/06 0400) Weight:  [156.9 kg (346 lb)] 156.9 kg (346 lb) (05/06 0500)   General appearance: alert, cooperative and no distress Resp: clear to auscultation bilaterally Chest wall: no tenderness, wound clean.  Cardio: regular rate and rhythm GI: normal findings: bowel sounds normal and soft, non-tender Male genitalia: scrotum dressed.   Lab Results Recent Labs    10/04/17 0354 10/04/17 0729 10/05/17 0546  WBC 39.0*  --  17.4*  HGB 10.5*  --  9.1*  HCT 31.6*  --  28.2*  NA  --  145 148*  K  --  4.2 3.4*  CL  --  112* 116*  CO2  --  19* 21*  BUN  --  70* 57*    CREATININE  --  2.02* 1.59*   Liver Panel No results for input(s): PROT, ALBUMIN, AST, ALT, ALKPHOS, BILITOT, BILIDIR, IBILI in the last 72 hours. Sedimentation Rate No results for input(s): ESRSEDRATE in the last 72 hours. C-Reactive Protein No results for input(s): CRP in the last 72 hours.  Microbiology: Recent Results (from the past 240 hour(s))  Culture, blood (Routine x 2)     Status: Abnormal   Collection Time: 09/30/17  5:32 PM  Result Value Ref Range Status   Specimen Description   Final    RIGHT ANTECUBITAL Performed at [facility], [address] [place], [place] [address]    Special Requests   Final    BOTTLES DRAWN AEROBIC AND ANAEROBIC Blood Culture adequate volume Performed at [facility], [address], [place], [place] [address]    Culture  Setup Time   Final    GRAM NEGATIVE RODS ANAEROBIC BOTTLE Gram Stain Report Called to,Read Back By and Verified With: FIELDS S. AT [place] AT 0920A ON [id number] BY THOMPSON S. GRAM POSITIVE COCCI IN CLUSTERS RECOVERED FROM THE AEROBIC BOTTLE Gram Stain Report Called to,Read Back By and Verified With: JOHNSON,J. AT 1308 ON 10/01/2017 BY BAUGHAM,M. Performed at [place].  VALUE IS CONSISTENT WITH PREVIOUSLY REPORTED AND CALLED VALUE. Performed at [facility], [place] [address], [place], [place] [address]    Culture (A)  Final    METHICILLIN RESISTANT STAPHYLOCOCCUS AUREUS ESCHERICHIA COLI    Report Status 10/03/2017 FINAL  Final   Organism ID, Bacteria METHICILLIN RESISTANT STAPHYLOCOCCUS AUREUS  Final   Organism ID, Bacteria ESCHERICHIA COLI  Final      Susceptibility   Escherichia coli - MIC*    AMPICILLIN >=32 RESISTANT Resistant     CEFAZOLIN <=4 SENSITIVE Sensitive     CEFEPIME <=1 SENSITIVE Sensitive     CEFTAZIDIME <=1 SENSITIVE Sensitive     CEFTRIAXONE <=1 SENSITIVE Sensitive     CIPROFLOXACIN >=4 RESISTANT Resistant     GENTAMICIN 8 INTERMEDIATE Intermediate     IMIPENEM <=0.25  SENSITIVE Sensitive     TRIMETH/SULFA >=320 RESISTANT Resistant     AMPICILLIN/SULBACTAM >=32 RESISTANT Resistant     PIP/TAZO <=4 SENSITIVE Sensitive     Extended ESBL NEGATIVE Sensitive     * ESCHERICHIA COLI   Methicillin resistant staphylococcus aureus - MIC*    CIPROFLOXACIN >=8 RESISTANT Resistant     ERYTHROMYCIN >=8 RESISTANT Resistant  GENTAMICIN <=0.5 SENSITIVE Sensitive     OXACILLIN >=4 RESISTANT Resistant     TETRACYCLINE >=16 RESISTANT Resistant     VANCOMYCIN 1 SENSITIVE Sensitive     TRIMETH/SULFA >=320 RESISTANT Resistant     CLINDAMYCIN >=8 RESISTANT Resistant     RIFAMPIN <=0.5 SENSITIVE Sensitive     Inducible Clindamycin NEGATIVE Sensitive     * METHICILLIN RESISTANT STAPHYLOCOCCUS AUREUS  Urine culture     Status: Abnormal   Collection Time: 09/30/17  5:32 PM  Result Value Ref Range Status   Specimen Description   Final    URINE, RANDOM Performed at [facility], [address], [place], [place] [address]    Special Requests   Final    NONE Performed at [facility], [address], [place], [place] [address]    Culture (A)  Final    >=100,000 COLONIES/mL ESCHERICHIA COLI >=100,000 COLONIES/mL KLEBSIELLA PNEUMONIAE    Report Status 10/04/2017 FINAL  Final   Organism ID, Bacteria ESCHERICHIA COLI (A)  Final   Organism ID, Bacteria KLEBSIELLA PNEUMONIAE (A)  Final      Susceptibility   Escherichia coli - MIC*    AMPICILLIN >=32 RESISTANT Resistant     CEFAZOLIN <=4 SENSITIVE Sensitive     CEFTRIAXONE <=1 SENSITIVE Sensitive     CIPROFLOXACIN >=4 RESISTANT Resistant     GENTAMICIN 4 SENSITIVE Sensitive     IMIPENEM <=0.25 SENSITIVE Sensitive     NITROFURANTOIN <=16 SENSITIVE Sensitive     TRIMETH/SULFA >=320 RESISTANT Resistant     AMPICILLIN/SULBACTAM >=32 RESISTANT Resistant     PIP/TAZO <=4 SENSITIVE Sensitive     Extended ESBL NEGATIVE Sensitive     * >=100,000 COLONIES/mL ESCHERICHIA COLI   Klebsiella pneumoniae - MIC*    AMPICILLIN >=32  RESISTANT Resistant     CEFAZOLIN <=4 SENSITIVE Sensitive     CEFTRIAXONE <=1 SENSITIVE Sensitive     CIPROFLOXACIN 2 INTERMEDIATE Intermediate     GENTAMICIN <=1 SENSITIVE Sensitive     IMIPENEM <=0.25 SENSITIVE Sensitive     NITROFURANTOIN 128 RESISTANT Resistant     TRIMETH/SULFA >=320 RESISTANT Resistant     AMPICILLIN/SULBACTAM >=32 RESISTANT Resistant     PIP/TAZO 16 SENSITIVE Sensitive     Extended ESBL NEGATIVE Sensitive     * >=100,000 COLONIES/mL KLEBSIELLA PNEUMONIAE  Culture, blood (Routine x 2)     Status: Abnormal   Collection Time: 09/30/17  5:37 PM  Result Value Ref Range Status   Specimen Description   Final    RIGHT ANTECUBITAL Performed at [facility], [address] [place], [place] [address]    Special Requests   Final    BOTTLES DRAWN AEROBIC AND ANAEROBIC Blood Culture adequate volume Performed at [place] [place], [place] [address]    Culture  Setup Time   Final    GRAM NEGATIVE RODS ANAEROBIC BOTTLE ONLY Gram Stain Report Called to,Read Back By and Verified With: FIELDS S. AT [place] AT 0920A ON [id number] BY THOMPSON S. GRAM POSITIVE COCCI IN CLUSTERS AEROBIC BOTTLE ONLY Gram Stain Report Called to,Read Back By and Verified With: JOHNSON,J. AT 7510 ON 10/01/2017 BY BAUGHAM,M. Performed at [facility]    Culture (A)  Final    STAPHYLOCOCCUS AUREUS SUSCEPTIBILITIES PERFORMED ON PREVIOUS CULTURE WITHIN THE LAST 5 DAYS. ESCHERICHIA COLI    Report Status 10/05/2017 FINAL  Final  Blood Culture ID Panel (Reflexed)     Status: Abnormal   Collection Time: 09/30/17  5:37 PM  Result Value Ref Range Status   Enterococcus species NOT DETECTED NOT DETECTED Final   Listeria monocytogenes NOT DETECTED NOT DETECTED Final   Staphylococcus species DETECTED (A) NOT DETECTED Final    Comment: CRITICAL RESULT CALLED TO, READ BACK BY AND VERIFIED WITH: Asencion Islam PharmD 16:05 10/01/17 (wilsonm)    Staphylococcus aureus DETECTED (A) NOT DETECTED  Final    Comment: Methicillin (oxacillin)-resistant Staphylococcus aureus (MRSA). MRSA is predictably resistant to beta-lactam antibiotics (except ceftaroline). Preferred therapy is vancomycin unless clinically contraindicated. Patient requires contact precautions if  hospitalized. CRITICAL RESULT CALLED TO, READ BACK BY AND VERIFIED WITH: Asencion Islam PharmD 16:05 10/01/17 (wilsonm)    Methicillin resistance DETECTED (A) NOT DETECTED Final    Comment: CRITICAL RESULT CALLED TO, READ BACK BY AND VERIFIED WITH: Asencion Islam PharmD 16:05 10/01/17 (wilsonm)    Streptococcus species NOT DETECTED NOT DETECTED Final   Streptococcus agalactiae NOT DETECTED NOT DETECTED Final   Streptococcus pneumoniae NOT DETECTED NOT DETECTED Final   Streptococcus pyogenes NOT DETECTED NOT DETECTED Final   Acinetobacter baumannii NOT DETECTED NOT DETECTED Final   Enterobacteriaceae species DETECTED (A) NOT DETECTED Final    Comment: Enterobacteriaceae represent a large family of gram-negative bacteria, not a single organism. CRITICAL RESULT CALLED TO, READ BACK BY AND VERIFIED WITH: Jacinto Reap Mancheril PharmD 16:05 10/01/17 (wilsonm)    Enterobacter cloacae complex NOT DETECTED NOT DETECTED Final   Escherichia coli DETECTED (A) NOT DETECTED Final    Comment: CRITICAL RESULT CALLED TO, READ BACK BY AND VERIFIED WITH: Asencion Islam PharmD 16:05 10/01/17 (wilsonm)    Klebsiella oxytoca NOT DETECTED NOT DETECTED Final   Klebsiella pneumoniae NOT DETECTED NOT DETECTED Final   Proteus species NOT DETECTED NOT DETECTED Final   Serratia marcescens NOT DETECTED NOT DETECTED Final   Carbapenem resistance NOT DETECTED NOT DETECTED Final   Haemophilus influenzae NOT DETECTED NOT DETECTED Final   Neisseria meningitidis NOT DETECTED NOT DETECTED Final   Pseudomonas aeruginosa NOT DETECTED NOT DETECTED Final   Candida albicans NOT DETECTED NOT DETECTED Final   Candida glabrata NOT DETECTED NOT DETECTED Final   Candida krusei NOT  DETECTED NOT DETECTED Final   Candida parapsilosis NOT DETECTED NOT DETECTED Final   Candida tropicalis NOT DETECTED NOT DETECTED Final    Comment: Performed at [facility], [place] [address] [place], [place] [address]  MRSA PCR Screening     Status: Abnormal   Collection Time: 10/01/17  2:26 AM  Result Value Ref Range Status   MRSA by PCR POSITIVE (A) NEGATIVE Final    Comment:        The GeneXpert MRSA Assay (FDA approved for NASAL specimens only), is one component of a comprehensive MRSA colonization surveillance program. It is not intended to diagnose MRSA infection nor to guide or monitor treatment for MRSA infections. RESULT CALLED TO, READ BACK BY AND VERIFIED WITH: B SHEPARD RN 10/01/17 0438 JDW   Culture, blood (routine x 2)     Status: None (Preliminary result)   Collection Time: 10/02/17  6:00 AM  Result Value Ref Range Status   Specimen Description BLOOD LEFT HAND  Final   Special Requests   Final    BOTTLES DRAWN AEROBIC ONLY Blood Culture adequate volume   Culture   Final    NO GROWTH 2 DAYS Performed at [facility], [place] [address] [place],  [address]    Report Status PENDING  Incomplete  Culture, blood (routine x 2)  Status: None (Preliminary result)   Collection Time: 10/02/17  6:14 AM  Result Value Ref Range Status   Specimen Description BLOOD RIGHT HAND  Final   Special Requests   Final    BOTTLES DRAWN AEROBIC ONLY Blood Culture adequate volume   Culture   Final    NO GROWTH 2 DAYS Performed at [facility], [address] [place], [place] [address]    Report Status PENDING  Incomplete  C difficile quick scan w PCR reflex     Status: None   Collection Time: 10/04/17  6:00 PM  Result Value Ref Range Status   C Diff antigen NEGATIVE NEGATIVE Final   C Diff toxin NEGATIVE NEGATIVE Final   C Diff interpretation No C. difficile detected.  Final    Comment: Performed at [facility], [place] [address] [place], [place]  [address]    Studies/Results: Dg Chest Port 1 View  Result Date: 10/05/2017 CLINICAL DATA:  Acute respiratory failure EXAM: PORTABLE CHEST 1 VIEW COMPARISON:  10/04/2017 FINDINGS: Cardiac shadow is stable. Elevation of left hemidiaphragm is again noted. Increasing vascular congestion and right basilar infiltrate is seen. Endotracheal tube and nasogastric catheter have been removed in the interval. IMPRESSION: Increasing right basilar infiltrate. Changes consistent with congestive failure. Electronically Signed   By: Inez Catalina M.D.   On: 10/05/2017 08:00   Dg Chest Port 1 View  Result Date: 10/04/2017 CLINICAL DATA:  57 year old male with history of acute respiratory failure with hypoxia. EXAM: PORTABLE CHEST 1 VIEW COMPARISON:  Chest x-ray 10/03/2017. FINDINGS: An endotracheal tube is in place with tip 5.1 cm above the carina. A nasogastric tube is seen extending into the stomach, however, the tip of the nasogastric tube extends below the lower margin of the image. Severe eventration of the lateral left hemidiaphragm, similar to prior studies. Low lung volumes. Bibasilar opacities (left greater than right) may reflect areas of atelectasis and/or airspace consolidation. No evidence of pulmonary edema. No pneumothorax. Heart size appears mildly enlarged. Upper mediastinal contours are within normal limits. IMPRESSION: 1. Support apparatus, as above. 2. Otherwise, the radiographic appearance the chest is very similar to prior studies, as above. Electronically Signed   By: Vinnie Langton M.D.   On: 10/04/2017 07:47     Assessment/Plan: MRSA bacteremia E coli bacteremia Emphysematous pyelitis Fournier's ? Tonic movements on imipenem and cipro ARF Multiple Myeloma (in remission since 05-2017 per family)  Total days of antibiotics: 5 vanco/cefazolin  Will continue his current anbx, aim for 2-3 weeks given his fournier's Watch wound healing.  Cr improving.  Repeat BCx 5-3 are ngtd.            Bobby Rumpf MD, FACP Infectious Diseases (pager) [phone] [email or web address] 10/05/2017, 9:46 AM  LOS: 5 days

## 2017-10-05 NOTE — Progress Notes (Addendum)
ANTICOAGULATION CONSULT NOTE - Follow Up Consult  Pharmacy Consult for heparin Indication: atrial fibrillation  Labs: Recent Labs    10/03/17 0228 10/03/17 0918 10/03/17 1525 10/03/17 2226 10/04/17 0354 10/04/17 0729 10/04/17 1154 10/04/17 2039 10/05/17 0546 10/05/17 1402  HGB 9.1*  --   --   --  10.5*  --   --   --  9.1*  --   HCT 26.5*  --   --   --  31.6*  --   --   --  28.2*  --   PLT 73*  --   --   --  85*  --   --   --  115*  --   APTT 52* 37*  --   --  41*  --  47*  --   --   --   LABPROT 18.4*  --   --   --   --   --   --   --   --   --   INR 1.55  --   --   --   --   --   --   --   --   --   HEPARINUNFRC 0.83*  --  0.47  --  0.77*  --  0.84* 1.06* 0.81* 0.67  CREATININE 2.18*  --   --   --   --  2.02*  --   --  1.59*  --   TROPONINI  --  0.17* 0.16* 0.15*  --   --   --   --   --   --     Assessment: CC/HPI: septic shock 2nd to UTI. Recent ureteral stent placement.   PMH: fourniers, afib  Anticoag: Xarelto PTA for Aflutter, switched to IV heparin PTA -heparin level therapeutic at 0.67   Goal of Therapy:  Heparin level 0.3-0.7 units/ml   Plan: -Maintain heparin at 950 units/hr -confirmatory Heparin level in 6 hours and daily wth CBC daily  Jhostin Epps A. Levada Dy, PharmD, [place] Pager: [phone]  10/05/2017 3:23 PM

## 2017-10-06 ENCOUNTER — Inpatient Hospital Stay: Payer: Self-pay

## 2017-10-06 DIAGNOSIS — I4949 Other premature depolarization: Secondary | ICD-10-CM

## 2017-10-06 DIAGNOSIS — B961 Klebsiella pneumoniae [K. pneumoniae] as the cause of diseases classified elsewhere: Secondary | ICD-10-CM

## 2017-10-06 LAB — COMPREHENSIVE METABOLIC PANEL
ALT: 21 U/L (ref 17–63)
AST: 20 U/L (ref 15–41)
Albumin: 2.2 g/dL — ABNORMAL LOW (ref 3.5–5.0)
Alkaline Phosphatase: 81 U/L (ref 38–126)
Anion gap: 9 (ref 5–15)
BUN: 40 mg/dL — ABNORMAL HIGH (ref 6–20)
CO2: 23 mmol/L (ref 22–32)
Calcium: 8.2 mg/dL — ABNORMAL LOW (ref 8.9–10.3)
Chloride: 111 mmol/L (ref 101–111)
Creatinine, Ser: 1.47 mg/dL — ABNORMAL HIGH (ref 0.61–1.24)
GFR calc Af Amer: 59 mL/min — ABNORMAL LOW (ref >60)
GFR calc non Af Amer: 51 mL/min — ABNORMAL LOW (ref >60)
Glucose, Bld: 125 mg/dL — ABNORMAL HIGH (ref 65–99)
Potassium: 3.1 mmol/L — ABNORMAL LOW (ref 3.5–5.1)
Sodium: 143 mmol/L (ref 135–145)
Total Bilirubin: 0.5 mg/dL (ref 0.3–1.2)
Total Protein: 5.2 g/dL — ABNORMAL LOW (ref 6.5–8.1)

## 2017-10-06 LAB — GLUCOSE, CAPILLARY
Glucose-Capillary: 133 mg/dL — ABNORMAL HIGH (ref 65–99)
Glucose-Capillary: 150 mg/dL — ABNORMAL HIGH (ref 65–99)
Glucose-Capillary: 140 mg/dL — ABNORMAL HIGH (ref 65–99)
Glucose-Capillary: 116 mg/dL — ABNORMAL HIGH (ref 65–99)

## 2017-10-06 LAB — HEPARIN LEVEL (UNFRACTIONATED)
Heparin Unfractionated: 0.15 IU/mL — ABNORMAL LOW (ref 0.30–0.70)
Heparin Unfractionated: <0.1 IU/mL — ABNORMAL LOW (ref 0.30–0.70)

## 2017-10-06 LAB — CBC
HCT: 26 % — ABNORMAL LOW (ref 39.0–52.0)
Hemoglobin: 8.5 g/dL — ABNORMAL LOW (ref 13.0–17.0)
MCH: 29.7 pg (ref 26.0–34.0)
MCHC: 32.7 g/dL (ref 30.0–36.0)
MCV: 90.9 fL (ref 78.0–100.0)
Platelets: 137 10*3/uL — ABNORMAL LOW (ref 150–400)
RBC: 2.86 MIL/uL — ABNORMAL LOW (ref 4.22–5.81)
RDW: 17.9 % — ABNORMAL HIGH (ref 11.5–15.5)
WBC: 13 10*3/uL — ABNORMAL HIGH (ref 4.0–10.5)

## 2017-10-06 LAB — PHOSPHORUS: Phosphorus: 2.7 mg/dL (ref 2.5–4.6)

## 2017-10-06 LAB — MAGNESIUM: Magnesium: 1.9 mg/dL (ref 1.7–2.4)

## 2017-10-06 MED ORDER — POTASSIUM CHLORIDE CRYS ER 20 MEQ PO TBCR
40.0000 meq | EXTENDED_RELEASE_TABLET | Freq: Once | ORAL | Status: AC
  Administered 2017-10-06: 40 meq via ORAL
  Filled 2017-10-06: qty 2

## 2017-10-06 MED ORDER — RIVAROXABAN 20 MG PO TABS
20.0000 mg | ORAL_TABLET | Freq: Every day | ORAL | Status: DC
  Administered 2017-10-06 – 2017-10-08 (×3): 20 mg via ORAL
  Filled 2017-10-06 (×3): qty 1

## 2017-10-06 MED ORDER — CHLORPROMAZINE HCL 25 MG PO TABS
25.0000 mg | ORAL_TABLET | Freq: Four times a day (QID) | ORAL | Status: DC | PRN
  Administered 2017-10-06: 25 mg via ORAL
  Filled 2017-10-06 (×3): qty 1

## 2017-10-06 MED ORDER — HEPARIN BOLUS VIA INFUSION
3500.0000 [IU] | Freq: Once | INTRAVENOUS | Status: AC
  Administered 2017-10-06: 3500 [IU] via INTRAVENOUS
  Filled 2017-10-06: qty 3500

## 2017-10-06 MED ORDER — SODIUM CHLORIDE 0.9% FLUSH
10.0000 mL | INTRAVENOUS | Status: DC | PRN
  Administered 2017-10-07: 10 mL
  Filled 2017-10-06: qty 40

## 2017-10-06 MED ORDER — TORSEMIDE 20 MG PO TABS
20.0000 mg | ORAL_TABLET | Freq: Two times a day (BID) | ORAL | Status: DC
  Administered 2017-10-07 – 2017-10-08 (×4): 20 mg via ORAL
  Filled 2017-10-06 (×4): qty 1

## 2017-10-06 NOTE — Progress Notes (Signed)
Nutrition Follow-up  DOCUMENTATION CODES:   Morbid obesity  INTERVENTION:  Premier Protein BID, each supplement provides 160 calories and 30 grams of protein  MVI w/ Minerals  NUTRITION DIAGNOSIS:   Inadequate oral intake related to inability to eat as evidenced by NPO status.  GOAL:   Patient will meet greater than or equal to 90% of their needs  MONITOR:   PO intake, I & O's, Skin, Weight trends   ASSESSMENT:   57 y.o. M admitted on 09/30/17 for septic shock secondary to UTI and MRSA intubated 10/01/17 for waxing and waning mental status. Pt PMH of acute metabolic encephalopathy, AKI on CKD stg III, chronic foley, OSA, anemia of chronic disease, multiple myeloma, thrombocytopenia, T2DM, and hx of MI.   Extubated 5/5  Patient resting at bedside. He complained of pain from the skin on his scrotum and rectum being rubbed raw. He is receiving antibiotics for this related to MRSA, E Coli, Klebsiella bacteremia and Fournier's, and will have to receive 4 weeks of IV ABx at home.  Patient states that he has recently changed his eating habits PTA, normally eats [place] bacon, toast, eggs, and drink juice for breakfast, eat a [place] sandwich for lunch, and will eat a cooked meal like [place] casserole or meat vegetables, and starch for dinner. Avoids beef and pork products. Reports eating well, denies any unintentional weight loss. No complaints from that perspective.  Appetite is ok today, he did not eat much of his breakfast because it came to him cold.  Encouraged him to send food back if it is cold and encouraged PO intake. He was open to drinking premier protein. Documented meal completion 40-60%  Labs reviewed:  K+ 3.1  Medications reviewed and include:  Insulin 1/2 NS at 36m/hr Heparin gtt   Diet Order:   Diet Order           Diet heart healthy/carb modified Room service appropriate? Yes; Fluid consistency: Thin  Diet effective now          EDUCATION NEEDS:   Not  appropriate for education at this time  Skin:  Skin Assessment: Skin Integrity Issues: Skin Integrity Issues:: Other (Comment) Other: Moisture associated skin damage - groin/abdomen, cellulitis - R/L ankle  Last BM:  10/05/2017  Height:   Ht Readings from Last 1 Encounters:  10/01/17 _0  (1.88 m)    Weight:   Wt Readings from Last 1 Encounters:  10/06/17 (!) 347 lb (157.4 kg)    Ideal Body Weight:  86.36 kg  BMI:  Body mass index is 44.55 kg/m.  Estimated Nutritional Needs:   Kcal:  2159-2590 calories (IBW x25-30 cal/kg)  Protein:  130-175 grams (IBW x1.5-2g/kg)  Fluid:  >2L  WSatira Anis Mykenzi Vanzile, MS, RD LDN Inpatient Clinical Dietitian Pager [phone]

## 2017-10-06 NOTE — Evaluation (Signed)
Occupational Therapy Evaluation Patient Details Name: [name] MRN: [id number] DOB: 06/29/1960 Today's Date: 10/06/2017    History of Present Illness 56yoM with hx OSA, Morbid obesity, CAD, HTN, Gout, GERD, Dm, Multiple myeloma, CKD, Aflutter, CHF (EF 35%), Anemia, who had a recent ureteral stent placed and now presents with AMS and Septic shock due to UTI. Port removed; Recovery comlicated by seizures; Extubated 5/6   Clinical Impression   Pt admitted with above and presents to OT with deficits impacting participation in ADLs (see OT problem list below).  Pt currently requires +2 for bed mobility and sit > stand due largely to scrotal pain and generalized weakness.  Pt reports progressing with HH OT and PT and would prefer to return home and continue with HH services.  As pt currently requiring +2 assist recommend SNF stay to maximize independence prior to d/c home.  Pt will benefit from OT acutely to increase independence with ADLs and functional mobility to allow discharge to venue listed below.    Follow Up Recommendations  Supervision/Assistance - 24 hour;SNF    Equipment Recommendations  None recommended by OT    Recommendations for Other Services       Precautions / Restrictions Precautions Precautions: Fall Precaution Comments: Very sore scrotum Restrictions Weight Bearing Restrictions: No      Mobility Bed Mobility Overal bed mobility: Needs Assistance Bed Mobility: Rolling     Supine to sit: +2 for physical assistance;Max assist     General bed mobility comments: Very slow moving, cues for technqiue; +2 assist for rolling for hygiene         ADL either performed or assessed with clinical judgement   ADL Overall ADL's : Needs assistance/impaired     Grooming: Set up;Bed level   Upper Body Bathing: Set up;Bed level   Lower Body Bathing: +2 for physical assistance;Bed level(due to scotal pain)   Upper Body Dressing : Set up   Lower Body  Dressing: +2 for physical assistance;Bed level                       Vision Baseline Vision/History: Wears glasses Wears Glasses: At all times Patient Visual Report: No change from baseline Vision Assessment?: No apparent visual deficits            Pertinent Vitals/Pain Pain Assessment: Faces Faces Pain Scale: Hurts little more Pain Location: scrotal pain Pain Descriptors / Indicators: Aching;Grimacing;Guarding Pain Intervention(s): Limited activity within patient's tolerance;Monitored during session     Hand Dominance Right   Extremity/Trunk Assessment Upper Extremity Assessment Upper Extremity Assessment: Generalized weakness           Communication Communication Communication: No difficulties   Cognition Arousal/Alertness: Awake/alert Behavior During Therapy: WFL for tasks assessed/performed Overall Cognitive Status: Within Functional Limits for tasks assessed                                                Home Living Family/patient expects to be discharged to:: Private residence Living Arrangements: Spouse/significant other Available Help at Discharge: Family;Personal care attendant(PCA M-F, wife assist Sat and Sun) Type of Home: House Home Access: Stairs to enter     Home Layout: Multi-level;Able to live on main level with bedroom/bathroom     Bathroom Shower/Tub: Tub/shower unit   Bathroom Toilet: Handicapped height     Home Equipment: Walker -   2 wheels;Wheelchair - manual;Hospital bed   Additional Comments: It sounds like pt has moved to stay mostly on one level in his home; Not very clear as to if he has steps to enter the level where he stays; Tells me he has a hospital bed in the room where he stays. He typically sponge bathes, requiring Mod assist for bathing.      Prior Functioning/Environment Level of Independence: Needs assistance  Gait / Transfers Assistance Needed: with Bari RW; household distances ADL's /  Homemaking Assistance Needed: Assist from family or personal care attendant; sponge bathes at this point.  Assist to wash lower legs and peri area   Comments: Difficult to get participation in mobility and ask questions about home situation in same session as pt was very slow moving        OT Problem List: Decreased strength;Decreased activity tolerance;Impaired balance (sitting and/or standing);Decreased safety awareness;Obesity;Pain      OT Treatment/Interventions: Self-care/ADL training;Therapeutic exercise;Energy conservation;Therapeutic activities;Patient/family education;Balance training    OT Goals(Current goals can be found in the care plan section) Acute Rehab OT Goals Patient Stated Goal: Would rather go home than to a SNF OT Goal Formulation: With patient Time For Goal Achievement: 10/20/17 Potential to Achieve Goals: Good  OT Frequency: Min 2X/week    AM-PAC PT "6 Clicks" Daily Activity     Outcome Measure Help from another person eating meals?: None Help from another person taking care of personal grooming?: A Little Help from another person toileting, which includes using toliet, bedpan, or urinal?: A Lot Help from another person bathing (including washing, rinsing, drying)?: A Little Help from another person to put on and taking off regular upper body clothing?: None Help from another person to put on and taking off regular lower body clothing?: A Lot 6 Click Score: 18   End of Session Nurse Communication: (IV beeping)  Activity Tolerance: Patient limited by pain Patient left: in bed;with family/visitor present;with nursing/sitter in room  OT Visit Diagnosis: Unsteadiness on feet (R26.81);Muscle weakness (generalized) (M62.81);Pain Pain - part of body: (scrotal pain)                Time: 1536-1551 OT Time Calculation (min): 15 min Charges:  OT Evaluation $OT Eval Moderate Complexity: 1 Mod   , , [phone] 10/06/2017, 4:16 PM 

## 2017-10-06 NOTE — Progress Notes (Signed)
[facility] TEAM 1 - Stepdown/ICU TEAM  [name]  DXI:[id number] DOB: Aug 14, 1960 DOA: 09/30/2017 PCP: [name], MD    Brief Narrative:  57 year old male with hx of morbid obesity, multiple myeloma on chronic steroids, systolic CHF, HTN, GERD, aflutter on Xarelto, CKD stage 3, anemia, DM, and chronic foley who presented to [facility] ER with altered mental status.  He required tx for Fournier's gangrene 04/2017.  Returned home from rehab in December.  Klebsiella bacteremia from obstructing ureteral stone with ureteral stent placement 3/1.  Home health exchanged foley on 4/30 and afterwards his wife noted altered mental status, decreased PO intake, and fever.   On arrival to ER he was febrile, minimally responsive, and hypotensive.  Labs noted for WBC 21.4, Cr 3.48 (up from 1.54 on 3/15), lactic 3.77.    Significant Events: 5/01 [facility] 5/2 intubated - chronic foley changed 5/03 Bicarb gtt, low dose pressors, R chest port removed, Tmax 101.7, 8L+ positive for admit 5/04 Weaning on PSV 5/5 extubated   Subjective: The pt reports that he is feeling better overall.  He denies sob, n/v, cp, or abdom pain.  He has developed hiccoughs today that have been persistent and troublesome.    Assessment & Plan:  Septic shock - E coli bacteremia - E coli and Kleb pneumoniae Emphysematous pyelitis Continue directed antibiotic therapy as per recommendations of ID - clinically markedly improved   Recent Fournier's has wound on scrotum - WOC Team directing care of this wound - encouraged pt to get up and out of bed   Acute respiratory failure with hypoxemia Extubated 5/5 - clinically stable at this time w/ pt on RA   MRSA bacteremia Continue antibiotic therapy as per ID suggestions   Polymyoclonus Resolved off meropenem and cipro - EEG unremarkable   AKI on CKD  Baseline crt 1.54 - back to his baseline and stable   Recent Labs  Lab 10/02/17 1334 10/03/17 0228  10/04/17 0729 10/05/17 0546 10/06/17 0453  CREATININE 2.41* 2.18* 2.02* 1.59* 1.47*   R ureteral stent  Placed 07/31/17 - f/u w/ Urology as per previous plan   Chronic foley cath  Changed during this hospital stay - f/u w/ Urology as per previous plan  Hypernatremia  Resolved w/ improved intake/diet   DM  CBG well controlled at this time  Multiple Myeloma  previously on maintenance with lenalidomide and dexamethasone - currently off due to recurrent severe infections - f/u w/ Oncology as outpt   Chronic systolic CHF  EF 76-73% via TTE Oct 2017 - no significant volume overload on clinical exam presently but weight is climbing - w/ Na now normalized will resume oral diuretic in AM  Filed Weights   10/04/17 0500 10/05/17 0500 10/06/17 0427  Weight: (!) 157.7 kg (347 lb 10.7 oz) (!) 156.9 kg (346 lb) (!) 157.4 kg (347 lb)    Chronic A flutter On Xarelto as outpt - resume now as pt stabilized  Sleep apnea Continue nightly CPAP routine as per home  Morbid obesity - Body mass index is 44.55 kg/m.  DVT prophylaxis: IV heparin Code Status: FULL CODE Family Communication: no family present at time of exam  Disposition Plan: should be stable for d/c home in ~48hrs if continues to improve   Consultants:  ID PCCM Neurology   Antimicrobials:  Zosyn 5/1 > 5/2 Cipro 5/2 > 5/3 Vancomycin 5/1 >  Ceftriaxone 5/3 > 5/4 Cefazolin 5/5 >  Objective: Blood  pressure (!) 114/54, pulse 66, temperature 98.3 F (36.8 C), temperature source Oral, resp. rate 20, height '6\' 2"'  (1.88 m), weight (!) 157.4 kg (347 lb), SpO2 100 %.  Intake/Output Summary (Last 24 hours) at 10/06/2017 1649 Last data filed at 10/06/2017 1534 Gross per 24 hour  Intake 863.85 ml  Output 2620 ml  Net -1756.15 ml   Filed Weights   10/04/17 0500 10/05/17 0500 10/06/17 0427  Weight: (!) 157.7 kg (347 lb 10.7 oz) (!) 156.9 kg (346 lb) (!) 157.4 kg (347 lb)    Examination: General: No acute respiratory distress at  rest in bed - hiccups  Lungs: Clear to auscultation B - distant BS - no wheezing  Cardiovascular: Regular rate and rhythm - no M  Abdomen: Nontender, morbidly obese, soft, bowel sounds positive, no rebound Extremities: 1+ B LE edema   CBC: Recent Labs  Lab 09/30/17 1732  10/04/17 0354 10/05/17 0546 10/06/17 0453  WBC 21.4*   < > 39.0* 17.4* 13.0*  NEUTROABS 19.2  --   --   --   --   HGB 10.6*   < > 10.5* 9.1* 8.5*  HCT 32.9*   < > 31.6* 28.2* 26.0*  MCV 93.7   < > 89.8 91.3 90.9  PLT 104*   < > 85* 115* 137*   < > = values in this interval not displayed.   Basic Metabolic Panel: Recent Labs  Lab 10/02/17 1546 10/03/17 0228 10/04/17 0729 10/05/17 0546 10/06/17 0453  NA  --  141 145 148* 143  K  --  3.7 4.2 3.4* 3.1*  CL  --  111 112* 116* 111  CO2  --  19* 19* 21* 23  GLUCOSE  --  207* 236* 130* 125*  BUN  --  56* 70* 57* 40*  CREATININE  --  2.18* 2.02* 1.59* 1.47*  CALCIUM  --  7.3* 7.9* 8.2* 8.2*  MG 2.1 2.2 2.4  --  1.9  PHOS 2.6 2.7  --   --  2.7   GFR: Estimated Creatinine Clearance: 87 mL/min (A) (by C-G formula based on SCr of 1.47 mg/dL (H)).  Liver Function Tests: Recent Labs  Lab 09/30/17 1732 10/01/17 0257 10/02/17 0430 10/06/17 0453  AST 94*  --  50* 20  ALT 63  --  42 21  ALKPHOS 64  --  62 81  BILITOT 0.9  --  0.8 0.5  PROT 6.1*  --  5.6* 5.2*  ALBUMIN 2.7* 2.5* 2.3* 2.2*    Recent Labs  Lab 10/03/17 0228  AMMONIA 23    Coagulation Profile: Recent Labs  Lab 09/30/17 1855 10/01/17 0257 10/03/17 0228  INR 2.86 2.79 1.55    Cardiac Enzymes: Recent Labs  Lab 10/01/17 1219 10/01/17 2114 10/03/17 0918 10/03/17 1525 10/03/17 2226  TROPONINI 0.16* 0.16* 0.17* 0.16* 0.15*    HbA1C: Hgb A1c MFr Bld  Date/Time Value Ref Range Status  04/02/2017 02:19 PM 8.2 (H) 4.8 - 5.6 % Final    Comment:    (NOTE) Pre diabetes:          5.7%-6.4% Diabetes:              >6.4% Glycemic control for   <7.0% adults with diabetes     09/24/2011 12:00 AM 9.2 (H) <5.7 % Final    Comment:    (NOTE)  According to the ADA Clinical Practice Recommendations for 2011, when HbA1c is used as a screening test:  >=6.5%   Diagnostic of Diabetes Mellitus           (if abnormal result is confirmed) 5.7-6.4%   Increased risk of developing Diabetes Mellitus References:Diagnosis and Classification of Diabetes Mellitus,Diabetes RXVQ,0086,76(PPJKD 1):S62-S69 and Standards of Medical Care in         Diabetes - 2011,Diabetes TOIZ,1245,80 (Suppl 1):S11-S61.    CBG: Recent Labs  Lab 10/05/17 1136 10/05/17 1521 10/05/17 2155 10/06/17 0801 10/06/17 1218  GLUCAP 172* 136* 111* 133* 150*    Recent Results (from the past 240 hour(s))  Culture, blood (Routine x 2)     Status: Abnormal   Collection Time: 09/30/17  5:32 PM  Result Value Ref Range Status   Specimen Description   Final    RIGHT ANTECUBITAL Performed at [facility] [address], [place], [place] [address]    Special Requests   Final    BOTTLES DRAWN AEROBIC AND ANAEROBIC Blood Culture adequate volume Performed at [facility], [address], [place], [place] [address]    Culture  Setup Time   Final    GRAM NEGATIVE RODS ANAEROBIC BOTTLE Gram Stain Report Called to,Read Back By and Verified With: FIELDS S. AT [place] AT 0920A ON [id number] BY THOMPSON S. [place] IN CLUSTERS RECOVERED FROM THE AEROBIC BOTTLE Gram Stain Report Called to,Read Back By and Verified With: JOHNSON,J. AT 4193 ON 10/01/2017 BY BAUGHAM,M. Performed at [facility].  VALUE IS CONSISTENT WITH PREVIOUSLY REPORTED AND CALLED VALUE. Performed at [facility], [place] [address] [place], [place] [address]    Culture (A)  Final    METHICILLIN RESISTANT STAPHYLOCOCCUS AUREUS ESCHERICHIA COLI    Report Status 10/03/2017 FINAL  Final   Organism ID, Bacteria METHICILLIN RESISTANT STAPHYLOCOCCUS  AUREUS  Final   Organism ID, Bacteria ESCHERICHIA COLI  Final      Susceptibility   Escherichia coli - MIC*    AMPICILLIN >=32 RESISTANT Resistant     CEFAZOLIN <=4 SENSITIVE Sensitive     CEFEPIME <=1 SENSITIVE Sensitive     CEFTAZIDIME <=1 SENSITIVE Sensitive     CEFTRIAXONE <=1 SENSITIVE Sensitive     CIPROFLOXACIN >=4 RESISTANT Resistant     GENTAMICIN 8 INTERMEDIATE Intermediate     IMIPENEM <=0.25 SENSITIVE Sensitive     TRIMETH/SULFA >=320 RESISTANT Resistant     AMPICILLIN/SULBACTAM >=32 RESISTANT Resistant     PIP/TAZO <=4 SENSITIVE Sensitive     Extended ESBL NEGATIVE Sensitive     * ESCHERICHIA COLI   Methicillin resistant staphylococcus aureus - MIC*    CIPROFLOXACIN >=8 RESISTANT Resistant     ERYTHROMYCIN >=8 RESISTANT Resistant     GENTAMICIN <=0.5 SENSITIVE Sensitive     OXACILLIN >=4 RESISTANT Resistant     TETRACYCLINE >=16 RESISTANT Resistant     VANCOMYCIN 1 SENSITIVE Sensitive     TRIMETH/SULFA >=320 RESISTANT Resistant     CLINDAMYCIN >=8 RESISTANT Resistant     RIFAMPIN <=0.5 SENSITIVE Sensitive     Inducible Clindamycin NEGATIVE Sensitive     * METHICILLIN RESISTANT STAPHYLOCOCCUS AUREUS  Urine culture     Status: Abnormal   Collection Time: 09/30/17  5:32 PM  Result Value Ref Range Status   Specimen Description   Final    URINE, RANDOM Performed at [facility], [address], [place], [place] [address]    Special Requests   Final  NONE Performed at [facility], [address] [place], [place] [address]    Culture (A)  Final    >=100,000 COLONIES/mL ESCHERICHIA COLI >=100,000 COLONIES/mL KLEBSIELLA PNEUMONIAE    Report Status 10/04/2017 FINAL  Final   Organism ID, Bacteria ESCHERICHIA COLI (A)  Final   Organism ID, Bacteria KLEBSIELLA PNEUMONIAE (A)  Final      Susceptibility   Escherichia coli - MIC*    AMPICILLIN >=32 RESISTANT Resistant     CEFAZOLIN <=4 SENSITIVE Sensitive     CEFTRIAXONE <=1 SENSITIVE Sensitive     CIPROFLOXACIN >=4  RESISTANT Resistant     GENTAMICIN 4 SENSITIVE Sensitive     IMIPENEM <=0.25 SENSITIVE Sensitive     NITROFURANTOIN <=16 SENSITIVE Sensitive     TRIMETH/SULFA >=320 RESISTANT Resistant     AMPICILLIN/SULBACTAM >=32 RESISTANT Resistant     PIP/TAZO <=4 SENSITIVE Sensitive     Extended ESBL NEGATIVE Sensitive     * >=100,000 COLONIES/mL ESCHERICHIA COLI   Klebsiella pneumoniae - MIC*    AMPICILLIN >=32 RESISTANT Resistant     CEFAZOLIN <=4 SENSITIVE Sensitive     CEFTRIAXONE <=1 SENSITIVE Sensitive     CIPROFLOXACIN 2 INTERMEDIATE Intermediate     GENTAMICIN <=1 SENSITIVE Sensitive     IMIPENEM <=0.25 SENSITIVE Sensitive     NITROFURANTOIN 128 RESISTANT Resistant     TRIMETH/SULFA >=320 RESISTANT Resistant     AMPICILLIN/SULBACTAM >=32 RESISTANT Resistant     PIP/TAZO 16 SENSITIVE Sensitive     Extended ESBL NEGATIVE Sensitive     * >=100,000 COLONIES/mL KLEBSIELLA PNEUMONIAE  Culture, blood (Routine x 2)     Status: Abnormal   Collection Time: 09/30/17  5:37 PM  Result Value Ref Range Status   Specimen Description   Final    RIGHT ANTECUBITAL Performed at [facility], [address] [place], [place] [address]    Special Requests   Final    BOTTLES DRAWN AEROBIC AND ANAEROBIC Blood Culture adequate volume Performed at [facility], [address] [place], [place] [address]    Culture  Setup Time   Final    GRAM NEGATIVE RODS ANAEROBIC BOTTLE ONLY Gram Stain Report Called to,Read Back By and Verified With: FIELDS S. AT [place] AT 0920A ON [id number] BY THOMPSON S. GRAM POSITIVE COCCI IN CLUSTERS AEROBIC BOTTLE ONLY Gram Stain Report Called to,Read Back By and Verified With: JOHNSON,J. AT 1258 ON 10/01/2017 BY BAUGHAM,M. Performed at [facility]    Culture (A)  Final    STAPHYLOCOCCUS AUREUS SUSCEPTIBILITIES PERFORMED ON PREVIOUS CULTURE WITHIN THE LAST 5 DAYS. ESCHERICHIA COLI    Report Status 10/05/2017 FINAL  Final  Blood Culture ID Panel (Reflexed)     Status: Abnormal     Collection Time: 09/30/17  5:37 PM  Result Value Ref Range Status   Enterococcus species NOT DETECTED NOT DETECTED Final   Listeria monocytogenes NOT DETECTED NOT DETECTED Final   Staphylococcus species DETECTED (A) NOT DETECTED Final    Comment: CRITICAL RESULT CALLED TO, READ BACK BY AND VERIFIED WITH: Asencion Islam PharmD 16:05 10/01/17 (wilsonm)    Staphylococcus aureus DETECTED (A) NOT DETECTED Final    Comment: Methicillin (oxacillin)-resistant Staphylococcus aureus (MRSA). MRSA is predictably resistant to beta-lactam antibiotics (except ceftaroline). Preferred therapy is vancomycin unless clinically contraindicated. Patient requires contact precautions if  hospitalized. CRITICAL RESULT CALLED TO, READ BACK BY AND VERIFIED WITH: Asencion Islam PharmD 16:05 10/01/17 (wilsonm)    Methicillin resistance DETECTED (A) NOT DETECTED Final    Comment:  CRITICAL RESULT CALLED TO, READ BACK BY AND VERIFIED WITH: Asencion Islam PharmD 16:05 10/01/17 (wilsonm)    Streptococcus species NOT DETECTED NOT DETECTED Final   Streptococcus agalactiae NOT DETECTED NOT DETECTED Final   Streptococcus pneumoniae NOT DETECTED NOT DETECTED Final   Streptococcus pyogenes NOT DETECTED NOT DETECTED Final   Acinetobacter baumannii NOT DETECTED NOT DETECTED Final   Enterobacteriaceae species DETECTED (A) NOT DETECTED Final    Comment: Enterobacteriaceae represent a large family of gram-negative bacteria, not a single organism. CRITICAL RESULT CALLED TO, READ BACK BY AND VERIFIED WITH: Jacinto Reap Mancheril PharmD 16:05 10/01/17 (wilsonm)    Enterobacter cloacae complex NOT DETECTED NOT DETECTED Final   Escherichia coli DETECTED (A) NOT DETECTED Final    Comment: CRITICAL RESULT CALLED TO, READ BACK BY AND VERIFIED WITH: Asencion Islam PharmD 16:05 10/01/17 (wilsonm)    Klebsiella oxytoca NOT DETECTED NOT DETECTED Final   Klebsiella pneumoniae NOT DETECTED NOT DETECTED Final   Proteus species NOT DETECTED NOT DETECTED Final    Serratia marcescens NOT DETECTED NOT DETECTED Final   Carbapenem resistance NOT DETECTED NOT DETECTED Final   Haemophilus influenzae NOT DETECTED NOT DETECTED Final   Neisseria meningitidis NOT DETECTED NOT DETECTED Final   Pseudomonas aeruginosa NOT DETECTED NOT DETECTED Final   Candida albicans NOT DETECTED NOT DETECTED Final   Candida glabrata NOT DETECTED NOT DETECTED Final   Candida krusei NOT DETECTED NOT DETECTED Final   Candida parapsilosis NOT DETECTED NOT DETECTED Final   Candida tropicalis NOT DETECTED NOT DETECTED Final    Comment: Performed at [facility], [place] [address] [place], [place] [address]  MRSA PCR Screening     Status: Abnormal   Collection Time: 10/01/17  2:26 AM  Result Value Ref Range Status   MRSA by PCR POSITIVE (A) NEGATIVE Final    Comment:        The GeneXpert MRSA Assay (FDA approved for NASAL specimens only), is one component of a comprehensive MRSA colonization surveillance program. It is not intended to diagnose MRSA infection nor to guide or monitor treatment for MRSA infections. RESULT CALLED TO, READ BACK BY AND VERIFIED WITH: B SHEPARD RN 10/01/17 0438 JDW   Culture, blood (routine x 2)     Status: None (Preliminary result)   Collection Time: 10/02/17  6:00 AM  Result Value Ref Range Status   Specimen Description BLOOD LEFT HAND  Final   Special Requests   Final    BOTTLES DRAWN AEROBIC ONLY Blood Culture adequate volume   Culture   Final    NO GROWTH 4 DAYS Performed at [facility], [place] [address], [place], [place] [address]    Report Status PENDING  Incomplete  Culture, blood (routine x 2)     Status: None (Preliminary result)   Collection Time: 10/02/17  6:14 AM  Result Value Ref Range Status   Specimen Description BLOOD RIGHT HAND  Final   Special Requests   Final    BOTTLES DRAWN AEROBIC ONLY Blood Culture adequate volume   Culture   Final    NO GROWTH 4 DAYS Performed at [facility], [place] [address]  [place], [place] [address]    Report Status PENDING  Incomplete  C difficile quick scan w PCR reflex     Status: None   Collection Time: 10/04/17  6:00 PM  Result Value Ref Range Status   C Diff antigen NEGATIVE NEGATIVE Final   C Diff toxin NEGATIVE NEGATIVE Final  C Diff interpretation No C. difficile detected.  Final    Comment: Performed at [facility], [place] [address] [place], [place] [address]     Scheduled Meds: . acyclovir  400 mg Oral q morning - 10a  . allopurinol  300 mg Oral Daily  . atorvastatin  80 mg Oral QHS  . furosemide  40 mg Intravenous BID  . gabapentin  100 mg Oral BID  . heparin  3,500 Units Intravenous Once  . insulin aspart  0-20 Units Subcutaneous TID WC  . insulin aspart  0-5 Units Subcutaneous QHS  . insulin detemir  10 Units Subcutaneous Daily  . mouth rinse  15 mL Mouth Rinse BID  . pantoprazole  40 mg Oral Q1200     LOS: 6 days   Cherene Altes, MD [facility] Office  [phone] Pager - Text Page per Amion as per below:  On-Call/Text Page:      [email or web address]      password [email or web address]  If 7PM-7AM, please contact night-coverage [email or web address] Password [facility] 10/06/2017, 4:49 PM

## 2017-10-06 NOTE — Progress Notes (Addendum)
ANTICOAGULATION CONSULT NOTE - Follow Up Consult  Pharmacy Consult for heparin Indication: atrial fibrillation  Labs: Recent Labs    10/03/17 2226  10/04/17 0354 10/04/17 0729 10/04/17 1154  10/05/17 0546  10/05/17 2027 10/06/17 0453 10/06/17 1408  HGB  --    < > 10.5*  --   --   --  9.1*  --   --  8.5*  --   HCT  --   --  31.6*  --   --   --  28.2*  --   --  26.0*  --   PLT  --   --  85*  --   --   --  115*  --   --  137*  --   APTT  --   --  41*  --  47*  --   --   --   --   --   --   HEPARINUNFRC  --    < > 0.77*  --  0.84*   < > 0.81*   < > 0.35 0.15* <0.10*  CREATININE  --   --   --  2.02*  --   --  1.59*  --   --  1.47*  --   TROPONINI 0.15*  --   --   --   --   --   --   --   --   --   --    < > = values in this interval not displayed.    Assessment: CC/HPI: septic shock 2nd to UTI. Recent ureteral stent placement.   PMH: fourniers, afib  Anticoag: Xarelto PTA for Aflutter, switched to IV heparin PTA -heparin level sub-therapeutic at <0.1, no issues with drip per RN. No bleeding noted. Heparin dosing weight 120kg   Goal of Therapy:  Heparin level 0.3-0.7 units/ml   Plan: rebolus with 3500 units and increase heparin drip to 1500 units/hr (~12.5units/kg/hr) HL in 6 hours and daily wth CBC daily  Ambreen Tufte A. Levada Dy, PharmD, [place] Pager: [phone]  Addendum: Decision has been made to restart patient's xarelto 20mg  daily. Will start this PM at 1800 and d/c heparin at that time.   10/06/2017 4:01 PM

## 2017-10-06 NOTE — Progress Notes (Signed)
[facility] will be providing [facility] [facility] services for home IV ABX at DC.  If patient discharges after hours, please call [phone].   Larry Sierras 10/06/2017, 3:03 PM

## 2017-10-06 NOTE — Progress Notes (Signed)
Per Pharmacy discontinue heparin drip when Xarelto given.

## 2017-10-06 NOTE — Progress Notes (Signed)
Peripherally Inserted Central Catheter/Midline Placement  The IV Nurse has discussed with the patient and/or persons authorized to consent for the patient, the purpose of this procedure and the potential benefits and risks involved with this procedure.  The benefits include less needle sticks, lab draws from the catheter, and the patient may be discharged home with the catheter. Risks include, but not limited to, infection, bleeding, blood clot (thrombus formation), and puncture of an artery; nerve damage and irregular heartbeat and possibility to perform a PICC exchange if needed/ordered by physician.  Alternatives to this procedure were also discussed.  Bard Power PICC patient education guide, fact sheet on infection prevention and patient information card has been provided to patient /or left at bedside.    PICC/Midline Placement Documentation  PICC Single Lumen 34/19/37 PICC Right Basilic 43 cm 0 cm (Active)  Indication for Insertion or Continuance of Line Home intravenous therapies (PICC only) 10/06/2017  5:00 PM  Exposed Catheter (cm) 0 cm 10/06/2017  5:00 PM  Site Assessment Clean;Dry;Intact 10/06/2017  5:00 PM  Line Status Flushed;Blood return noted 10/06/2017  5:00 PM  Dressing Type Transparent 10/06/2017  5:00 PM  Dressing Status Clean;Dry;Intact;Antimicrobial disc in place 10/06/2017  5:00 PM  Dressing Change Due 10/13/17 10/06/2017  5:00 PM       Jule Economy Horton 10/06/2017, 5:46 PM

## 2017-10-06 NOTE — Progress Notes (Signed)
Informed Dr. Thereasa Waller that patient has a burning sensation around foley cath and that there is a scant amount of bloody drainage. No new orders received.

## 2017-10-06 NOTE — Progress Notes (Signed)
[place] for Infectious Disease  Date of Admission:  09/30/2017              ASSESSMENT/PLAN  [name] is on Day 7 of antimicrobial therapy for MRSA and E. Coli bacteremia likely related to his Fournier. Tolerating vancomycin and cefazolin with no adverse side effects. Repeat blood cultures are no growth to date after 3 days. Plan is to treat for 4 weeks with end date of 11/02/17. OPAT orders placed.   1. Continue vancomycin and cefazolin with end date 11/02/17. 2. Place PICC. 3. Follow up in clinic in 4 weeks.  4. Continue to monitor cultures.   Available as needed for remainder of hospitalization.   Diagnosis: E. Coli and MRSA bacteremia   Culture Result: same  No Known Allergies  OPAT Orders Discharge antibiotics: Vancomycin and cefazolin . Per pharmacy protocol  Aim for Vancomycin trough 15-20 (unless otherwise indicated) Duration: 4 weeks End Date: November 02, 2017  [facility] Care Per Protocol:  Labs weekly while on IV antibiotics: _X_ CBC with differential __ BMP _X_ CMP __ CRP __ ESR _X_ Vancomycin trough  _X_ Please pull PIC at completion of IV antibiotics __ Please leave PIC in place until doctor has seen patient or been notified  Fax weekly labs to [phone]  Clinic Follow Up Appt:  4 weeks at the completion of therapy.   @    Active Problems:   Urinary tract infection with hematuria   AKI (acute kidney injury) ([place])   Septic shock ([place])   Acute encephalopathy   MRSA bacteremia   E. coli bacteremia   Acute respiratory failure with hypoxia ([place])   . acyclovir  400 mg Oral q morning - 10a  . allopurinol  300 mg Oral Daily  . atorvastatin  80 mg Oral QHS  . furosemide  40 mg Intravenous BID  . gabapentin  100 mg Oral BID  . insulin aspart  0-20 Units Subcutaneous TID WC  . insulin aspart  0-5 Units Subcutaneous QHS  . insulin detemir  10 Units Subcutaneous Daily  . mouth rinse  15 mL Mouth Rinse BID  . pantoprazole  40 mg Oral  Q1200    SUBJECTIVE:  Afebrile with improving leukocytosis. Continues to receive vancomycin and cefazolin. Having scrotum pain. Denies fevers, chills, or night sweats.   No Known Allergies   Review of Systems: Review of Systems  Constitutional: Negative for chills, diaphoresis, fever and malaise/fatigue.  Respiratory: Negative for cough and wheezing.   Cardiovascular: Negative for chest pain.  Gastrointestinal: Negative for abdominal pain, constipation, diarrhea, nausea and vomiting.  Skin: Negative for rash.      OBJECTIVE: Vitals:   10/06/17 0427 10/06/17 0438 10/06/17 0803 10/06/17 1203  BP:   115/75 (!) 114/54  Pulse:  64 72 66  Resp:  _0 Temp:   98.8 F (37.1 C) 98.3 F (36.8 C)  TempSrc:   Oral Oral  SpO2:  98% 100% 100%  Weight: (!) 347 lb (157.4 kg)     Height:       Body mass index is 44.55 kg/m.  Physical Exam  Constitutional:  Obese, lying in bed; pleasant  Cardiovascular: Normal rate, regular rhythm, normal heart sounds and intact distal pulses.  Occasional extrasystoles are present. Exam reveals no gallop and no friction rub.  No murmur heard. Pulmonary/Chest: Effort normal and breath sounds normal. No stridor. No respiratory distress. He has no wheezes. He has no rales. He exhibits no  tenderness.    Lab Results Lab Results  Component Value Date   WBC 13.0 (H) 10/06/2017   HGB 8.5 (L) 10/06/2017   HCT 26.0 (L) 10/06/2017   MCV 90.9 10/06/2017   PLT 137 (L) 10/06/2017    Lab Results  Component Value Date   CREATININE 1.47 (H) 10/06/2017   BUN 40 (H) 10/06/2017   NA 143 10/06/2017   K 3.1 (L) 10/06/2017   CL 111 10/06/2017   CO2 23 10/06/2017    Lab Results  Component Value Date   ALT 21 10/06/2017   AST 20 10/06/2017   ALKPHOS 81 10/06/2017   BILITOT 0.5 10/06/2017     Microbiology: Recent Results (from the past 240 hour(s))  Culture, blood (Routine x 2)     Status: Abnormal   Collection Time: 09/30/17  5:32 PM  Result  Value Ref Range Status   Specimen Description   Final    RIGHT ANTECUBITAL Performed at [facility], [address] [place], [place] [address]    Special Requests   Final    BOTTLES DRAWN AEROBIC AND ANAEROBIC Blood Culture adequate volume Performed at [facility], [address] [place], [place] [address]    Culture  Setup Time   Final    GRAM NEGATIVE RODS ANAEROBIC BOTTLE Gram Stain Report Called to,Read Back By and Verified With: FIELDS S. AT [place] AT 0920A ON [id number] BY THOMPSON S. GRAM POSITIVE COCCI IN CLUSTERS RECOVERED FROM THE AEROBIC BOTTLE Gram Stain Report Called to,Read Back By and Verified With: JOHNSON,J. AT 0923 ON 10/01/2017 BY BAUGHAM,M. Performed at [place].  VALUE IS CONSISTENT WITH PREVIOUSLY REPORTED AND CALLED VALUE. Performed at [facility], [place] [address], [place], [place] [address]    Culture (A)  Final    METHICILLIN RESISTANT STAPHYLOCOCCUS AUREUS ESCHERICHIA COLI    Report Status 10/03/2017 FINAL  Final   Organism ID, Bacteria METHICILLIN RESISTANT STAPHYLOCOCCUS AUREUS  Final   Organism ID, Bacteria ESCHERICHIA COLI  Final      Susceptibility   Escherichia coli - MIC*    AMPICILLIN >=32 RESISTANT Resistant     CEFAZOLIN <=4 SENSITIVE Sensitive     CEFEPIME <=1 SENSITIVE Sensitive     CEFTAZIDIME <=1 SENSITIVE Sensitive     CEFTRIAXONE <=1 SENSITIVE Sensitive     CIPROFLOXACIN >=4 RESISTANT Resistant     GENTAMICIN 8 INTERMEDIATE Intermediate     IMIPENEM <=0.25 SENSITIVE Sensitive     TRIMETH/SULFA >=320 RESISTANT Resistant     AMPICILLIN/SULBACTAM >=32 RESISTANT Resistant     PIP/TAZO <=4 SENSITIVE Sensitive     Extended ESBL NEGATIVE Sensitive     * ESCHERICHIA COLI   Methicillin resistant staphylococcus aureus - MIC*    CIPROFLOXACIN >=8 RESISTANT Resistant     ERYTHROMYCIN >=8 RESISTANT Resistant     GENTAMICIN <=0.5 SENSITIVE Sensitive     OXACILLIN >=4 RESISTANT Resistant     TETRACYCLINE >=16  RESISTANT Resistant     VANCOMYCIN 1 SENSITIVE Sensitive     TRIMETH/SULFA >=320 RESISTANT Resistant     CLINDAMYCIN >=8 RESISTANT Resistant     RIFAMPIN <=0.5 SENSITIVE Sensitive     Inducible Clindamycin NEGATIVE Sensitive     * METHICILLIN RESISTANT STAPHYLOCOCCUS AUREUS  Urine culture     Status: Abnormal   Collection Time: 09/30/17  5:32 PM  Result Value Ref Range Status   Specimen Description   Final    URINE, RANDOM Performed at [facility], [place]  [address], [place], [place] [address]    Special Requests   Final    NONE Performed at [facility], [address], [place], [place] [address]    Culture (A)  Final    >=100,000 COLONIES/mL ESCHERICHIA COLI >=100,000 COLONIES/mL KLEBSIELLA PNEUMONIAE    Report Status 10/04/2017 FINAL  Final   Organism ID, Bacteria ESCHERICHIA COLI (A)  Final   Organism ID, Bacteria KLEBSIELLA PNEUMONIAE (A)  Final      Susceptibility   Escherichia coli - MIC*    AMPICILLIN >=32 RESISTANT Resistant     CEFAZOLIN <=4 SENSITIVE Sensitive     CEFTRIAXONE <=1 SENSITIVE Sensitive     CIPROFLOXACIN >=4 RESISTANT Resistant     GENTAMICIN 4 SENSITIVE Sensitive     IMIPENEM <=0.25 SENSITIVE Sensitive     NITROFURANTOIN <=16 SENSITIVE Sensitive     TRIMETH/SULFA >=320 RESISTANT Resistant     AMPICILLIN/SULBACTAM >=32 RESISTANT Resistant     PIP/TAZO <=4 SENSITIVE Sensitive     Extended ESBL NEGATIVE Sensitive     * >=100,000 COLONIES/mL ESCHERICHIA COLI   Klebsiella pneumoniae - MIC*    AMPICILLIN >=32 RESISTANT Resistant     CEFAZOLIN <=4 SENSITIVE Sensitive     CEFTRIAXONE <=1 SENSITIVE Sensitive     CIPROFLOXACIN 2 INTERMEDIATE Intermediate     GENTAMICIN <=1 SENSITIVE Sensitive     IMIPENEM <=0.25 SENSITIVE Sensitive     NITROFURANTOIN 128 RESISTANT Resistant     TRIMETH/SULFA >=320 RESISTANT Resistant     AMPICILLIN/SULBACTAM >=32 RESISTANT Resistant     PIP/TAZO 16 SENSITIVE Sensitive     Extended ESBL NEGATIVE Sensitive     * >=100,000  COLONIES/mL KLEBSIELLA PNEUMONIAE  Culture, blood (Routine x 2)     Status: Abnormal   Collection Time: 09/30/17  5:37 PM  Result Value Ref Range Status   Specimen Description   Final    RIGHT ANTECUBITAL Performed at [facility], [address], [place], [place] [address]    Special Requests   Final    BOTTLES DRAWN AEROBIC AND ANAEROBIC Blood Culture adequate volume Performed at [facility], [address], [place],  [address]    Culture  Setup Time   Final    GRAM NEGATIVE RODS ANAEROBIC BOTTLE ONLY Gram Stain Report Called to,Read Back By and Verified With: FIELDS S. AT [place] AT 0920A ON [id number] BY THOMPSON S. GRAM POSITIVE COCCI IN CLUSTERS AEROBIC BOTTLE ONLY Gram Stain Report Called to,Read Back By and Verified With: JOHNSON,J. AT 5170 ON 10/01/2017 BY BAUGHAM,M. Performed at [facility]    Culture (A)  Final    STAPHYLOCOCCUS AUREUS SUSCEPTIBILITIES PERFORMED ON PREVIOUS CULTURE WITHIN THE LAST 5 DAYS. ESCHERICHIA COLI    Report Status 10/05/2017 FINAL  Final  Blood Culture ID Panel (Reflexed)     Status: Abnormal   Collection Time: 09/30/17  5:37 PM  Result Value Ref Range Status   Enterococcus species NOT DETECTED NOT DETECTED Final   Listeria monocytogenes NOT DETECTED NOT DETECTED Final   Staphylococcus species DETECTED (A) NOT DETECTED Final    Comment: CRITICAL RESULT CALLED TO, READ BACK BY AND VERIFIED WITH: Asencion Islam PharmD 16:05 10/01/17 (wilsonm)    Staphylococcus aureus DETECTED (A) NOT DETECTED Final    Comment: Methicillin (oxacillin)-resistant Staphylococcus aureus (MRSA). MRSA is predictably resistant to beta-lactam antibiotics (except ceftaroline). Preferred therapy is vancomycin unless clinically contraindicated. Patient requires contact precautions if  hospitalized. CRITICAL RESULT CALLED TO, READ BACK BY AND VERIFIED WITH: Asencion Islam PharmD 16:05 10/01/17 (wilsonm)  Methicillin resistance DETECTED (A) NOT DETECTED Final    Comment:  CRITICAL RESULT CALLED TO, READ BACK BY AND VERIFIED WITH: Asencion Islam PharmD 16:05 10/01/17 (wilsonm)    Streptococcus species NOT DETECTED NOT DETECTED Final   Streptococcus agalactiae NOT DETECTED NOT DETECTED Final   Streptococcus pneumoniae NOT DETECTED NOT DETECTED Final   Streptococcus pyogenes NOT DETECTED NOT DETECTED Final   Acinetobacter baumannii NOT DETECTED NOT DETECTED Final   Enterobacteriaceae species DETECTED (A) NOT DETECTED Final    Comment: Enterobacteriaceae represent a large family of gram-negative bacteria, not a single organism. CRITICAL RESULT CALLED TO, READ BACK BY AND VERIFIED WITH: Jacinto Reap Mancheril PharmD 16:05 10/01/17 (wilsonm)    Enterobacter cloacae complex NOT DETECTED NOT DETECTED Final   Escherichia coli DETECTED (A) NOT DETECTED Final    Comment: CRITICAL RESULT CALLED TO, READ BACK BY AND VERIFIED WITH: Asencion Islam PharmD 16:05 10/01/17 (wilsonm)    Klebsiella oxytoca NOT DETECTED NOT DETECTED Final   Klebsiella pneumoniae NOT DETECTED NOT DETECTED Final   Proteus species NOT DETECTED NOT DETECTED Final   Serratia marcescens NOT DETECTED NOT DETECTED Final   Carbapenem resistance NOT DETECTED NOT DETECTED Final   Haemophilus influenzae NOT DETECTED NOT DETECTED Final   Neisseria meningitidis NOT DETECTED NOT DETECTED Final   Pseudomonas aeruginosa NOT DETECTED NOT DETECTED Final   Candida albicans NOT DETECTED NOT DETECTED Final   Candida glabrata NOT DETECTED NOT DETECTED Final   Candida krusei NOT DETECTED NOT DETECTED Final   Candida parapsilosis NOT DETECTED NOT DETECTED Final   Candida tropicalis NOT DETECTED NOT DETECTED Final    Comment: Performed at [facility], [place] [address] [place], [place] [address]  MRSA PCR Screening     Status: Abnormal   Collection Time: 10/01/17  2:26 AM  Result Value Ref Range Status   MRSA by PCR POSITIVE (A) NEGATIVE Final    Comment:        The GeneXpert MRSA Assay (FDA approved for NASAL  specimens only), is one component of a comprehensive MRSA colonization surveillance program. It is not intended to diagnose MRSA infection nor to guide or monitor treatment for MRSA infections. RESULT CALLED TO, READ BACK BY AND VERIFIED WITH: B SHEPARD RN 10/01/17 0438 JDW   Culture, blood (routine x 2)     Status: None (Preliminary result)   Collection Time: 10/02/17  6:00 AM  Result Value Ref Range Status   Specimen Description BLOOD LEFT HAND  Final   Special Requests   Final    BOTTLES DRAWN AEROBIC ONLY Blood Culture adequate volume   Culture   Final    NO GROWTH 3 DAYS Performed at [facility], [place] [address], [place], [place] [address]    Report Status PENDING  Incomplete  Culture, blood (routine x 2)     Status: None (Preliminary result)   Collection Time: 10/02/17  6:14 AM  Result Value Ref Range Status   Specimen Description BLOOD RIGHT HAND  Final   Special Requests   Final    BOTTLES DRAWN AEROBIC ONLY Blood Culture adequate volume   Culture   Final    NO GROWTH 3 DAYS Performed at [facility], [place] [address] [place], [place] [address]    Report Status PENDING  Incomplete  C difficile quick scan w PCR reflex     Status: None   Collection Time: 10/04/17  6:00 PM  Result Value Ref Range Status   C Diff antigen NEGATIVE  NEGATIVE Final   C Diff toxin NEGATIVE NEGATIVE Final   C Diff interpretation No C. difficile detected.  Final    Comment: Performed at [facility], [place] [address], [place],  [address]     Terri Piedra, [place] for [place] [phone] Pager  10/06/2017  12:43 PM

## 2017-10-07 DIAGNOSIS — R5381 Other malaise: Secondary | ICD-10-CM

## 2017-10-07 DIAGNOSIS — G7281 Critical illness myopathy: Secondary | ICD-10-CM

## 2017-10-07 LAB — CBC
HCT: 25.5 % — ABNORMAL LOW (ref 39.0–52.0)
Hemoglobin: 8.2 g/dL — ABNORMAL LOW (ref 13.0–17.0)
MCH: 29.5 pg (ref 26.0–34.0)
MCHC: 32.2 g/dL (ref 30.0–36.0)
MCV: 91.7 fL (ref 78.0–100.0)
Platelets: 141 10*3/uL — ABNORMAL LOW (ref 150–400)
RBC: 2.78 MIL/uL — ABNORMAL LOW (ref 4.22–5.81)
RDW: 18.1 % — ABNORMAL HIGH (ref 11.5–15.5)
WBC: 10.7 10*3/uL — ABNORMAL HIGH (ref 4.0–10.5)

## 2017-10-07 LAB — BASIC METABOLIC PANEL
Anion gap: 7 (ref 5–15)
BUN: 30 mg/dL — ABNORMAL HIGH (ref 6–20)
CO2: 23 mmol/L (ref 22–32)
Calcium: 8 mg/dL — ABNORMAL LOW (ref 8.9–10.3)
Chloride: 113 mmol/L — ABNORMAL HIGH (ref 101–111)
Creatinine, Ser: 1.34 mg/dL — ABNORMAL HIGH (ref 0.61–1.24)
GFR calc Af Amer: >60 mL/min (ref >60)
GFR calc non Af Amer: 57 mL/min — ABNORMAL LOW (ref >60)
Glucose, Bld: 144 mg/dL — ABNORMAL HIGH (ref 65–99)
Potassium: 3.4 mmol/L — ABNORMAL LOW (ref 3.5–5.1)
Sodium: 143 mmol/L (ref 135–145)

## 2017-10-07 LAB — CULTURE, BLOOD (ROUTINE X 2)
Culture: NO GROWTH
Culture: NO GROWTH
Special Requests: ADEQUATE
Special Requests: ADEQUATE

## 2017-10-07 LAB — GLUCOSE, CAPILLARY
Glucose-Capillary: 137 mg/dL — ABNORMAL HIGH (ref 65–99)
Glucose-Capillary: 159 mg/dL — ABNORMAL HIGH (ref 65–99)
Glucose-Capillary: 180 mg/dL — ABNORMAL HIGH (ref 65–99)

## 2017-10-07 LAB — VANCOMYCIN, TROUGH: Vancomycin Tr: 25 ug/mL (ref 15–20)

## 2017-10-07 MED ORDER — DICYCLOMINE HCL 20 MG PO TABS
20.0000 mg | ORAL_TABLET | Freq: Three times a day (TID) | ORAL | Status: DC
  Administered 2017-10-07 – 2017-10-08 (×4): 20 mg via ORAL
  Filled 2017-10-07 (×4): qty 1

## 2017-10-07 MED ORDER — SODIUM CHLORIDE 0.9 % IV SOLN
1500.0000 mg | INTRAVENOUS | Status: DC
  Administered 2017-10-07 – 2017-10-08 (×2): 1500 mg via INTRAVENOUS
  Filled 2017-10-07 (×3): qty 1500

## 2017-10-07 MED ORDER — VANCOMYCIN HCL 10 G IV SOLR: 1500.0000 mg | INTRAVENOUS | Status: DC

## 2017-10-07 MED ORDER — ELUXADOLINE 100 MG PO TABS
100.0000 mg | ORAL_TABLET | Freq: Two times a day (BID) | ORAL | Status: DC
  Administered 2017-10-07 – 2017-10-08 (×2): 100 mg via ORAL
  Filled 2017-10-07 (×3): qty 1

## 2017-10-07 NOTE — Progress Notes (Signed)
Patient's RN placed him on the CPAP

## 2017-10-07 NOTE — NC FL2 (Addendum)
[place] LEVEL OF CARE SCREENING TOOL     IDENTIFICATION  Patient Name: [name] Birthdate: Dec 12, 1960 Sex: male Admission Date (Current Location): 09/30/2017  [facility] and [place] Number:  [employer] and Address:  [facility] [place], [place] [address] [place], [place] [address]      Provider Number: [id number]  Attending Physician Name and Address:  Mendel Corning, MD  Relative Name and Phone Number:  Shelia Media, [phone]    Current Level of Care: Hospital Recommended Level of Care: [place] Prior Approval Number:    Date Approved/Denied:   PASRR Number: [id number]  Discharge Plan: SNF    Current Diagnoses: Patient Active Problem List   Diagnosis Date Noted  . MRSA bacteremia 10/03/2017  . E. coli bacteremia 10/03/2017  . Acute respiratory failure with hypoxia ([place])   . Acute encephalopathy   . Septic shock ([place]) 09/30/2017  . CKD (chronic kidney disease) 08/13/2017  . Pressure injury of skin 08/04/2017  . AKI (acute kidney injury) ([place]) 07/31/2017  . Fournier's gangrene of scrotum 04/14/2017  . CAP (community acquired pneumonia) 06/10/2016  . Hypogammaglobulinemia ([place]) 03/09/2016  . Diaphragmatic hernia without obstruction 02/26/2016  . Chronic systolic heart failure ([place]) 12/19/2013  . Anemia, normocytic normochromic 10/09/2012  . Chronic pancreatitis ([place]) 10/07/2012  . Atrial flutter ([place]) 09/17/2012  . Urinary tract infection with hematuria 09/07/2012  . DDD (degenerative disc disease), cervical 03/18/2012  . Arteriosclerotic cardiovascular disease (ASCVD)   . Hypertension   . Hyperlipidemia   . Multiple myeloma ([place]) 07/01/2011  . Morbid obesity ([place]) 04/29/2010  . Insulin dependent diabetes mellitus ([place]) 11/15/2008  . Sleep apnea 11/15/2008    Orientation RESPIRATION BLADDER Height & Weight     Self, Time, Situation, Place  Normal Incontinent, Indwelling catheter Weight:  (!) 157.4 kg (347 lb) Height:  '6\' 2"'  (188 cm)  BEHAVIORAL SYMPTOMS/MOOD NEUROLOGICAL BOWEL NUTRITION STATUS      Incontinent Diet(Please see DC Summary)  AMBULATORY STATUS COMMUNICATION OF NEEDS Skin   Extensive Assist Verbally PU Stage and Appropriate Care                       Personal Care Assistance Level of Assistance  Bathing, Feeding, Dressing Bathing Assistance: Maximum assistance Feeding assistance: Independent Dressing Assistance: Limited assistance     Functional Limitations Info  Sight, Hearing, Speech Sight Info: Adequate Hearing Info: Adequate Speech Info: Adequate    SPECIAL CARE FACTORS FREQUENCY  PT (By licensed PT), OT (By licensed OT)     PT Frequency: 5x/week OT Frequency: 3x/week            Contractures      Additional Factors Info  Code Status, Allergies, Isolation Precautions, Insulin Sliding Scale Code Status Info: Full Allergies Info: NKA   Insulin Sliding Scale Info: 3x daily with meals and at bedtime Isolation Precautions Info: MRSA     Current Medications (10/07/2017):  This is the current hospital active medication list Current Facility-Administered Medications  Medication Dose Route Frequency Provider Last Rate Last Dose  . acetaminophen (TYLENOL) tablet 650 mg  650 mg Oral Q6H PRN Cherene Altes, MD   650 mg at 10/05/17 1602  . acyclovir (ZOVIRAX) tablet 400 mg  400 mg Oral q morning - 10a Cherene Altes, MD   400 mg at 10/06/17 0837  . allopurinol (ZYLOPRIM) tablet 300 mg  300 mg Oral Daily Cherene Altes, MD   300  mg at 10/06/17 0836  . atorvastatin (LIPITOR) tablet 80 mg  80 mg Oral QHS Cherene Altes, MD   80 mg at 10/06/17 2111  . ceFAZolin (ANCEF) IVPB 2g/100 mL premix  2 g Intravenous Q8H Wilhelmina Mcardle, MD   Stopped at 10/07/17 [phone]  . chlorproMAZINE (THORAZINE) tablet 25 mg  25 mg Oral QID PRN Cherene Altes, MD   25 mg at 10/06/17 2112  . fentaNYL (SUBLIMAZE) injection 25-50 mcg  25-50 mcg  Intravenous Q2H PRN Joette Catching T, MD      . gabapentin (NEURONTIN) capsule 100 mg  100 mg Oral BID Cherene Altes, MD   100 mg at 10/06/17 2112  . insulin aspart (novoLOG) injection 0-20 Units  0-20 Units Subcutaneous TID WC Anders Simmonds, MD   3 Units at 10/06/17 1835  . insulin aspart (novoLOG) injection 0-5 Units  0-5 Units Subcutaneous QHS Anders Simmonds, MD      . insulin detemir (LEVEMIR) injection 10 Units  10 Units Subcutaneous Daily [name] L, NP   10 Units at 10/06/17 [phone]  . MEDLINE mouth rinse  15 mL Mouth Rinse BID Cherene Altes, MD   15 mL at 10/06/17 2112  . ondansetron (ZOFRAN) injection 4 mg  4 mg Intravenous Q6H PRN Hammonds, Sharyn Blitz, MD      . oxyCODONE (Oxy IR/ROXICODONE) immediate release tablet 5-10 mg  5-10 mg Oral Q4H PRN Cherene Altes, MD   10 mg at 10/06/17 2112  . pantoprazole (PROTONIX) EC tablet 40 mg  40 mg Oral Q1200 Cherene Altes, MD   40 mg at 10/06/17 1237  . rivaroxaban (XARELTO) tablet 20 mg  20 mg Oral Q supper Joselyn Glassman A, RPH   20 mg at 10/06/17 1834  . sodium chloride flush (NS) 0.9 % injection 10-40 mL  10-40 mL Intracatheter PRN Cherene Altes, MD   10 mL at 10/07/17 0427  . torsemide (DEMADEX) tablet 20 mg  20 mg Oral BID Cherene Altes, MD      . vancomycin (VANCOCIN) 1,750 mg in sodium chloride 0.9 % 500 mL IVPB  1,750 mg Intravenous Q24H Lavenia Atlas, [facility]   Stopped at 10/06/17 1849   Facility-Administered Medications Ordered in Other Encounters  Medication Dose Route Frequency Provider Last Rate Last Dose  . sodium chloride 0.9 % injection 10 mL  10 mL Intravenous Once Farrel Gobble, MD         Discharge Medications: Please see discharge summary for a list of discharge medications.  Relevant Imaging Results:  Relevant Lab Results:   Additional Information SS#:[id number]   Uses CPAP at night, needs 4 weeks IV Vancomycin and [place] Thi Klich, LCSWA

## 2017-10-07 NOTE — Progress Notes (Signed)
CRITICAL VALUE ALERT  Critical Value:  Vanc troph 25  Date & Time Notied:  10/07/2017 1330  Provider Notified: Hildred Alamin in [facility]armacy  Orders Received/Actions taken:Pharmacist to change orders for vanc dose

## 2017-10-07 NOTE — Progress Notes (Signed)
NCM received consult: Please check if patient is a candidate for LTAC. NCM spoke with pt and @ bedside regarding LTAC. Pt stated would like to be considered for LTAC. Choice offered. [facility] per pt[facility]wife. NCM made referral for [facility] with [facility] @ [phone].[facility] to f/u in am. Whitman Hero RN,BSN,CM

## 2017-10-07 NOTE — Progress Notes (Signed)
Rehab admissions - Please see rehab consult done by Dr. Letta Pate recommending SNF versus LTAC.  Patient not an appropriate candidate for acute inpatient rehab admission.  Call me for questions.  #[phone]

## 2017-10-07 NOTE — Progress Notes (Signed)
Pharmacy Antibiotic Note  [name] is a 57 y.o. male admitted on 09/30/2017 with MRSA and Ecoli bacteremia, klebsiella pneumoniae/Ecoli UTI.  Pharmacy has been consulted for vancomycin dosing. Also on cefazolin per MD. Abx to continue x 4 weeks through 11/02/2017 per ID recommendations. Afebrile, WBC down to 10.7. SCr trend down to 1.34, good UOP. Vancomycin trough this afternoon is high at 25, drawn ~1hr early, true trough closer to 23.  Plan: Reduce vancomycin to 1500mg  IV q24h Cefazolin 2g IV q8h per ID Monitor clinical progress, c/s, renal function, vancomycin trough at new steady state OPAT orders entered   Height: 6\' 2"  (188 cm) Weight: (!) 347 lb (157.4 kg) IBW/kg (Calculated) : 82.2  Temp (24hrs), Avg:98.6 F (37 C), Min:98.2 F (36.8 C), Max:99.3 F (37.4 C)  Recent Labs  Lab 09/30/17 1939  10/01/17 0300 10/01/17 1219  10/02/17 1016 10/02/17 1334 10/03/17 0228 10/04/17 0354 10/04/17 0729 10/05/17 0546 10/06/17 0453 10/07/17 0421 10/07/17 1220  WBC  --    < >  --   --    < >  --   --  33.0* 39.0*  --  17.4* 13.0* 10.7*  --   CREATININE  --    < >  --   --    < >  --  2.41* 2.18*  --  2.02* 1.59* 1.47* 1.34*  --   LATICACIDVEN 3.41*  --  2.6* 1.9  --  2.0* 2.0*  --   --   --   --   --   --   --   VANCOTROUGH  --   --   --   --   --   --   --   --   --   --   --   --   --  25*   < > = values in this interval not displayed.    Estimated Creatinine Clearance: 95.4 mL/min (A) (by C-G formula based on SCr of 1.34 mg/dL (H)).    No Known Allergies  Antimicrobials this admission: Vanc 5/1 >> (11/02/17) Zosyn 5/1 >> 5/2 Cefepime 5/2 >> 5/3 Merrem 5/3 >> 5/3 Cipro 5/2 >> 5/3 Ceftriaxone 5/3 >> 5/5 Cefazolin 5/5 >> (11/02/17) Acyclovir 5/6 (PTA) >>  Dose adjustments this admission: 5/8 VT 25, drawn ~1hr early (true trough closer to 23) on 1750 q24h > reduced to 1500mg  q24h  Microbiology results: 5/1 Blood x 2 - MRSA/Ecoli 2/2 (S-ancef) 5/1 urine - > 100k  ecoli, kleb (S: ancef) 5/1 MRSA PCR: positive 5/3 blood cx: neg 5/5 cdiff negative  Elicia Lamp, PharmD, BCPS Clinical Pharmacist Clinical phone for 10/07/2017 until 3:30pm: [phone] If after 3:30pm, please call main pharmacy at: [phone] 10/07/2017 1:59 PM

## 2017-10-07 NOTE — Consult Note (Signed)
Physical Medicine and Rehabilitation Consult Reason for Consult: Decreased functional mobility Referring Physician: [facility]   HPI: [name] is a 57 y.o. right-handed male with history of obstructive sleep apnea, morbid obesity with BMI 44.55, CAD, hypertension, multiple myeloma, CKD stage III, diastolic congestive heart failure, atrial flutter maintained on Xarelto, recent ureteral stent placed.  Patient lives with spouse.  Used a walker prior to admission.  He was receiving home health therapies after recent ureteral stent placement.  His wife works during the day.  He has a home health aide 8 hours a day.  Split level home.  He stays on the first floor but there are still 3 steps.  Presented 10/01/2017 with altered mental status.  CT of the abdomen showed uncomplicated cholelithiasis.  No hydronephrosis.  Stable right double pigtail ureteral stent in place with decompressed bladder.  Troponin 0 0.16, lactic acid 1.9, WBC 27,800, creatinine 3.44.  Findings of E. coli and MRSA bacteremia.  Presently on vancomycin and ceftezole and through 11/02/2017 and followed by infectious disease.  Therapies have been completed and ongoing.  MD has requested physical medicine rehab consult.  History of Fournier's gangrene and has been in skilled nursing facility for wound care in the past.  Multiple myeloma treatment with lenalidomide and dexamethasone was placed on hold during treatment of his infections last November  Patient states that he has had difficulty at home as well with upper extremity pain and weakness.  Review of Systems  Constitutional: Positive for fever and malaise/fatigue.  HENT: Negative for hearing loss.   Eyes: Negative for blurred vision and double vision.  Respiratory: Positive for shortness of breath. Negative for cough.   Cardiovascular: Positive for leg swelling. Negative for chest pain and palpitations.  Gastrointestinal: Positive for constipation. Negative for nausea  and vomiting.  Genitourinary: Negative for dysuria, flank pain and hematuria.  Musculoskeletal: Positive for joint pain and myalgias.  Skin: Negative for rash.  Psychiatric/Behavioral: The patient has insomnia.   All other systems reviewed and are negative.  Past Medical History:  Diagnosis Date  . Abscess 04/2017   Scrotal  . Anemia   . Arteriosclerotic cardiovascular disease (ASCVD)    MI-2000s; stent to the proximal LAD and diagonal in 2001; stress nuclear in 2008-impaired exercise capacity, left ventricular dilatation, moderately to severely depressed EF, apical, inferior and anteroseptal scar  . Arthritis   . Atrial flutter ([place])   . 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 Cellulitis of leg    both legs  . Chronic diarrhea   . Chronic kidney disease, stage 3, mod decreased GFR (HCC)    Creatinine of 1.84 in 06/2011 and 1.5 in 07/2011  . Diabetes mellitus    Insulin  . Dysrhythmia    AFlutter  . GERD (gastroesophageal reflux disease)   . Gout   . Hyperlipidemia   . Hypertension   . Injection site reaction   . Multiple myeloma 07/01/2011  . Myocardial infarction ([place]) 2000  . Obesity   . Pedal edema    Venous insufficiency  . Sleep apnea    uses cpap  . Ulcer    Past Surgical History:  Procedure Laterality Date  . ABSCESS DRAINAGE     Scrotal  . BIOPSY  01/02/2012   Procedure: BIOPSY;  Surgeon: Rogene Houston, MD;  [facility];  Service: Endoscopy;  Laterality: N/A;  . BONE MARROW BIOPSY  05/13/11  . CARDIAC CATHETERIZATION     cardiac stent  .  CARDIOVERSION N/A 10/13/2012   Procedure: CARDIOVERSION;  Surgeon: Yehuda Savannah, MD;  Location: [facility];  Service: Cardiovascular;  Laterality: N/A;  . CATARACT EXTRACTION W/PHACO Left 02/13/2014   Procedure: CATARACT EXTRACTION PHACO AND INTRAOCULAR LENS PLACEMENT ([place]);  Surgeon: Tonny Branch, MD;  Location: [facility];  Service: Ophthalmology;  Laterality: Left;  CDE:  7.67  . CATARACT EXTRACTION W/PHACO Right  03/02/2014   Procedure: CATARACT EXTRACTION PHACO AND INTRAOCULAR LENS PLACEMENT RIGHT EYE CDE=16.81;  Surgeon: Tonny Branch, MD;  Location: [facility];  Service: Ophthalmology;  Laterality: Right;  . COLONOSCOPY  11/28/2011   Procedure: COLONOSCOPY;  Surgeon: Rogene Houston, MD;  [facility];  Service: Endoscopy;  Laterality: N/A;  930  . CYSTOSCOPY W/ URETERAL STENT PLACEMENT Right 07/31/2017   Procedure: CYSTOSCOPY WITH RETROGRADE PYELOGRAM/URETERAL STENT PLACEMENT;  Surgeon: Cleon Gustin, MD;  [facility];  Service: Urology;  Laterality: Right;  . ESOPHAGOGASTRODUODENOSCOPY  01/02/2012   Procedure: ESOPHAGOGASTRODUODENOSCOPY (EGD);  Surgeon: Rogene Houston, MD;  [facility];  Service: Endoscopy;  Laterality: N/A;  100  . ESOPHAGOGASTRODUODENOSCOPY N/A 09/20/2012   Procedure: ESOPHAGOGASTRODUODENOSCOPY (EGD);  Surgeon: Rogene Houston, MD;  [facility];  Service: Endoscopy;  Laterality: N/A;  . EUS N/A 10/07/2012   Procedure: UPPER ENDOSCOPIC ULTRASOUND (EUS) LINEAR;  Surgeon: Milus Banister, MD;  [facility];  Service: Endoscopy;  Laterality: N/A;  . INCISION AND DRAINAGE ABSCESS N/A 04/14/2017   Procedure: INCISION AND DRAINAGE ABSCESS;  Surgeon: Ceasar Mons, MD;  [facility];  Service: Urology;  Laterality: N/A;  . INCISION AND DRAINAGE ABSCESS ANAL    . IRRIGATION AND DEBRIDEMENT ABSCESS N/A 04/15/2017   Procedure: DEBRIDEMENT SCROTAL WOUND AND DRESSING CHANGE;  Surgeon: Ceasar Mons, MD;  [facility];  Service: Urology;  Laterality: N/A;  . IRRIGATION AND DEBRIDEMENT ABSCESS N/A 04/17/2017   Procedure: IRRIGATION AND DEBRIDEMENT ABSCESS;  Surgeon: Ceasar Mons, MD;  [facility];  Service: Urology;  Laterality: N/A;  RM 3  . LAPAROSCOPIC GASTRIC BANDING  2006   has been removed  . OSTECTOMY Right 04/08/2017   Procedure: OSTECTOMY RIGHT GREAT TOE;  Surgeon: Caprice Beaver, DPM;   [facility];  Service: Podiatry;  Laterality: Right;  . PORT-A-CATH REMOVAL Left 12/07/2012   Procedure: REMOVAL PORT-A-CATH;  Surgeon: Scherry Ran, MD;  [facility];  Service: General;  Laterality: Left;  . PORT-A-CATH REMOVAL N/A 10/02/2017   Procedure: MINOR REMOVAL PORT-A-CATH AT BEDSIDE;  Surgeon: Donnie Mesa, MD;  Location: [facility];  Service: General;  Laterality: N/A;  . PORTACATH PLACEMENT  07/07/2011   Procedure: INSERTION PORT-A-CATH;  Surgeon: Scherry Ran, MD;  [facility];  Service: General;  Laterality: N/A;  . PORTACATH PLACEMENT N/A 12/07/2012   Procedure: INSERTION PORT-A-CATH;  Surgeon: Scherry Ran, MD;  [facility];  Service: General;  Laterality: N/A;  Attempted portacath placement on left and right side  . WOUND DEBRIDEMENT Right 04/08/2017   Procedure: EXCISION ULCERATION RIGHT GREAT TOE;  Surgeon: Caprice Beaver, DPM;  [facility];  Service: Podiatry;  Laterality: Right;  . WRIST SURGERY     Left; removal of bone fragment   Family History  Problem Relation Age of Onset  . Heart disease Mother   . Cancer Mother   . Diabetes Father   . Arthritis Unknown   . Anesthesia problems Neg Hx   . Hypotension Neg Hx   . Malignant hyperthermia Neg  Hx   . Pseudochol deficiency Neg Hx    Social History:  reports that he quit smoking about 16 years ago. His smoking use included cigarettes and cigars. He has a 0.25 pack-year smoking history. He has never used smokeless tobacco. He reports that he does not drink alcohol or use drugs. Allergies: No Known Allergies Medications Prior to Admission  Medication Sig Dispense Refill  . acetaminophen (TYLENOL) 500 MG tablet Take 500-1,000 mg by mouth every 6 (six) hours as needed for mild pain or moderate pain.    Marland Kitchen acyclovir (ZOVIRAX) 400 MG tablet Take 1 tablet (400 mg total) by mouth every morning. 30 tablet 5  . allopurinol (ZYLOPRIM) 300 MG tablet TAKE ONE TABLET BY MOUTH ONCE DAILY. 30 tablet  2  . Artificial Tear Ointment (DRY EYES OP) Place 1 drop into both eyes as needed (dry eyes).    Marland Kitchen aspirin EC 81 MG tablet Take 81 mg by mouth daily.    Marland Kitchen atorvastatin (LIPITOR) 80 MG tablet TAKE ONE TABLET BY MOUTH AT BEDTIME. 90 tablet 3  . calcitRIOL (ROCALTROL) 0.25 MCG capsule Take 0.25 mcg by mouth 3 (three) times a week. Monday, Wednesday, Friday.    . Calcium Carbonate (CALCIUM 600 PO) Take 1 tablet by mouth 2 (two) times daily.    Marland Kitchen dexlansoprazole (DEXILANT) 60 MG capsule Take 60 mg by mouth every morning.    . dicyclomine (BENTYL) 20 MG tablet TAKE 1 TABLET BY MOUTH BEFORE MEALS 3 TIMES DAILY. 90 tablet 3  . Eluxadoline (VIBERZI) 100 MG TABS Take 100 mg 2 (two) times daily with a meal by mouth.     . gabapentin (NEURONTIN) 300 MG capsule Take 1 capsule (300 mg total) by mouth 2 (two) times daily. 60 capsule 0  . insulin detemir (LEVEMIR) 100 UNIT/ML injection Inject 0.1 mLs (10 Units total) into the skin every morning. 10 mL 11  . insulin lispro (HUMALOG) 100 UNIT/ML injection Inject 0-0.1 mLs (0-10 Units total) into the skin 2 (two) times daily with a meal. Sliding Scale per patient  CBG 150-200: 1 units CBG 201-275:  3 units CBG 275-350: 5 units 351-425: 7 units 425-500: 9 units 500+: 10 units 10 mL 0  . loratadine (CLARITIN) 10 MG tablet Take 10 mg by mouth every morning.     . Magnesium Cl-Calcium Carbonate (SLOW-MAG PO) Take 1 tablet by mouth every morning.    . methocarbamol (ROBAXIN) 500 MG tablet Take 1 tablet (500 mg total) by mouth every 8 (eight) hours as needed for muscle spasms. 15 tablet 0  . Multiple Vitamins-Minerals (MULTIVITAMINS THER. W/MINERALS) TABS Take 1 tablet by mouth daily.     . nitroGLYCERIN (NITROSTAT) 0.4 MG SL tablet DISSOLVE 1 TABLET UNDER TONGUE EVERY 5 MINUTES UP TO 15 MIN FOR CHESTPAIN. IF NO RELIEF CALL 911. 25 tablet 0  . potassium chloride SA (K-DUR,KLOR-CON) 20 MEQ tablet Take 1 tablet (20 mEq total) by mouth daily. 30 tablet 0  .  sulfamethoxazole-trimethoprim (BACTRIM DS,SEPTRA DS) 800-160 MG tablet TAKE ONE TABLET BY MOUTH EVERY MONDAY, WEDNESDAY, AND FRIDAY. 12 tablet 0  . torsemide (DEMADEX) 20 MG tablet Take 1 tablet (20 mg total) by mouth daily. Take extra 1 tab for weight gain of 3 lbs in 1 day or 5 lbs in 2 days (Patient taking differently: Take 20 mg by mouth 2 (two) times daily. Take extra 1 tab for weight gain of 3 lbs in 1 day or 5 lbs in 2 days) 10 tablet 0  .  Vitamin D, Ergocalciferol, (DRISDOL) 50000 units CAPS capsule TAKE 1 CAPSULE BY MOUTH EVERY THIRTY DAYS. 3 capsule PRN  . XARELTO 20 MG TABS tablet TAKE 1 TABLET BY MOUTH DAILY. 30 tablet 6  . sulfamethoxazole-trimethoprim (BACTRIM DS,SEPTRA DS) 800-160 MG tablet TAKE ONE TABLET BY MOUTH EVERY MONDAY, WEDNESDAY, AND FRIDAY. (Patient not taking: Reported on 09/30/2017) 12 tablet 0  . traMADol (ULTRAM) 50 MG tablet Take 1 tablet (50 mg total) by mouth every 6 (six) hours as needed. (Patient not taking: Reported on 09/30/2017) 20 tablet 0    Home: Home Living Family/patient expects to be discharged to:: Private residence Living Arrangements: Spouse/significant other Available Help at Discharge: Family, Personal care attendant(PCA M-F, wife assist Sat and Sun) Type of Home: House Home Access: Stairs to enter Home Layout: Multi-level, Able to live on main level with bedroom/bathroom Bathroom Shower/Tub: Chiropodist: Handicapped height Home Equipment: Environmental consultant - 2 wheels, Wheelchair - manual, Hospital bed Additional Comments: It sounds like pt has moved to stay mostly on one level in his home; Not very clear as to if he has steps to enter the level where he stays; Tells me he has a hospital bed in the room where he stays. He typically sponge bathes, requiring Mod assist for bathing.  Functional History: Prior Function Level of Independence: Needs assistance Gait / Transfers Assistance Needed: with [name]; household distances ADL's /  Homemaking Assistance Needed: Assist from family or personal care attendant; sponge bathes at this point.  Assist to wash lower legs and peri area Comments: Difficult to get participation in mobility and ask questions about home situation in same session as pt was very slow moving Functional Status:  Mobility: Bed Mobility Overal bed mobility: Needs Assistance Bed Mobility: Rolling Supine to sit: +2 for physical assistance, Max assist General bed mobility comments: Very slow moving, cues for technqiue; +2 assist for rolling for hygiene Transfers Overall transfer level: Needs assistance Equipment used: Rolling walker (2 wheeled) Transfers: Sit to/from Stand Sit to Stand: +2 physical assistance, Mod assist General transfer comment: Heavy mod assist to power up to stand; "stalled" mid-stand, requiring max encouragement and more assist to stay standing and come to as upright as possible Ambulation/Gait Ambulation/Gait assistance: Mod assist, +2 physical assistance, +2 safety/equipment Ambulation Distance (Feet): (Pivotal steps bed to chair) Assistive device: Rolling walker (2 wheeled) Gait Pattern/deviations: Shuffle General Gait Details: Short, shuffling steps with heavy dependence on RW for support    ADL: ADL Overall ADL's : Needs assistance/impaired Grooming: Set up, Bed level Upper Body Bathing: Set up, Bed level Lower Body Bathing: +2 for physical assistance, Bed level(due to scotal pain) Upper Body Dressing : Set up Lower Body Dressing: +2 for physical assistance, Bed level  Cognition: Cognition Overall Cognitive Status: Within Functional Limits for tasks assessed Orientation Level: Oriented X4 Cognition Arousal/Alertness: Awake/alert Behavior During Therapy: WFL for tasks assessed/performed Overall Cognitive Status: Within Functional Limits for tasks assessed General Comments: Moves very slowly, and has difficulty taking suggestions/cues for more efficient  movement  Blood pressure 116/70, pulse 65, temperature 98.2 F (36.8 C), temperature source Oral, resp. rate 18, height '6\' 2"'  (1.88 m), weight (!) 157.4 kg (347 lb), SpO2 100 %. Physical Exam  Vitals reviewed. Constitutional: He is oriented to person, place, and time.  HENT:  Head: Normocephalic.  Eyes: EOM are normal.  Neck: Normal range of motion. Neck supple. No thyromegaly present.  Cardiovascular: Normal rate, regular rhythm and normal heart sounds.  Respiratory: Effort normal and  breath sounds normal. No respiratory distress.  Neurological: He is alert and oriented to person, place, and time.  Skin:  Ischemic changes to the lower extremities  Constitutional.  57 year old morbidly obese male No muscle testing 3- bilateral deltoid 4 bilateral biceps 3+ bilateral triceps 4- at the finger flexors and extensors to minus at the right hip flexor knee extensor ankle dorsiflexor, 3- in the left hip flexor knee extensor ankle dorsiflexor Sensation reduced to light touch below the ankles bilaterally. Results for orders placed or performed during the hospital encounter of 09/30/17 (from the past 24 hour(s))  Glucose, capillary     Status: Abnormal   Collection Time: 10/06/17 12:18 PM  Result Value Ref Range   Glucose-Capillary 150 (H) 65 - 99 mg/dL  Heparin level (unfractionated)     Status: Abnormal   Collection Time: 10/06/17  2:08 PM  Result Value Ref Range   Heparin Unfractionated <0.10 (L) 0.30 - 0.70 IU/mL  Glucose, capillary     Status: Abnormal   Collection Time: 10/06/17  4:46 PM  Result Value Ref Range   Glucose-Capillary 140 (H) 65 - 99 mg/dL  Glucose, capillary     Status: Abnormal   Collection Time: 10/06/17  9:24 PM  Result Value Ref Range   Glucose-Capillary 116 (H) 65 - 99 mg/dL  Basic metabolic panel     Status: Abnormal   Collection Time: 10/07/17  4:21 AM  Result Value Ref Range   Sodium 143 135 - 145 mmol/L   Potassium 3.4 (L) 3.5 - 5.1 mmol/L   Chloride 113 (H)  101 - 111 mmol/L   CO2 23 22 - 32 mmol/L   Glucose, Bld 144 (H) 65 - 99 mg/dL   BUN 30 (H) 6 - 20 mg/dL   Creatinine, Ser 1.34 (H) 0.61 - 1.24 mg/dL   Calcium 8.0 (L) 8.9 - 10.3 mg/dL   GFR calc non Af Amer 57 (L) >60 mL/min   GFR calc Af Amer >60 >60 mL/min   Anion gap 7 5 - 15  CBC     Status: Abnormal   Collection Time: 10/07/17  4:21 AM  Result Value Ref Range   WBC 10.7 (H) 4.0 - 10.5 K/uL   RBC 2.78 (L) 4.22 - 5.81 MIL/uL   Hemoglobin 8.2 (L) 13.0 - 17.0 g/dL   HCT 25.5 (L) 39.0 - 52.0 %   MCV 91.7 78.0 - 100.0 fL   MCH 29.5 26.0 - 34.0 pg   MCHC 32.2 30.0 - 36.0 g/dL   RDW 18.1 (H) 11.5 - 15.5 %   Platelets 141 (L) 150 - 400 K/uL  Glucose, capillary     Status: Abnormal   Collection Time: 10/07/17  7:56 AM  Result Value Ref Range   Glucose-Capillary 137 (H) 65 - 99 mg/dL   *Note: Due to a large number of results and/or encounters for the requested time period, some results have not been displayed. A complete set of results can be found in Results Review.   [place] Ekg Site Rite  Result Date: 10/06/2017 If Site Rite image not attached, placement could not be confirmed due to current cardiac rhythm.    Assessment/Plan: Diagnosis: Debility with quadriparesis, question critical illness myopathy/neuropathy 1. Does the need for close, 24 hr/day medical supervision in concert with the patient's rehab needs make it unreasonable for this patient to be served in a less intensive setting? Potentially 2. Co-Morbidities requiring supervision/potential complications: Diabetes, morbid obesity with sleep apnea, coronary artery disease with CHF, multiple  myeloma, chronic kidney disease, history of gout 3. Due to bladder management, bowel management, safety, skin/wound care, disease management, medication administration, pain management and patient education, does the patient require 24 hr/day rehab nursing? Potentially 4. Does the patient require coordinated care of a physician, rehab nurse,  PT, OT 1 hour/day each to address physical and functional deficits in the context of the above medical diagnosis(es)? Yes Addressing deficits in the following areas: balance, endurance, locomotion, strength, transferring, bowel/bladder control, bathing, dressing, feeding and toileting 5. Can the patient actively participate in an intensive therapy program of at least 3 hrs of therapy per day at least 5 days per week? No 6. The potential for patient to make measurable gains while on inpatient rehab is Poor for CIR level, good for SNF level 7. Anticipated functional outcomes upon discharge from inpatient rehab are min assist and n/a  with PT, min assist and n/a with OT, n/a with SLP. 8. Estimated rehab length of stay to reach the above functional goals is: N/A 9. Anticipated D/C setting: Home after SNF 10. Anticipated post D/C treatments: [place] therapy 11. Overall Rehab/Functional Prognosis: fair  RECOMMENDATIONS: This patient's condition is appropriate for continued rehabilitative care in the following setting: SNF versus LTAC Patient has agreed to participate in recommended program. Potentially Note that insurance prior authorization may be required for reimbursement for recommended care.  Comment: Agree with OT evaluation do not think patient would progress adequately or participate adequately for an intensive inpatient in-hospital rehabilitation program  Charlett Blake M.D. [facility] FAAPM&R (Sports Med, Neuromuscular Med) Diplomate Am Board of Electrodiagnostic Med  Cathlyn Parsons, PA-C 10/07/2017

## 2017-10-07 NOTE — Progress Notes (Signed)
PHARMACY CONSULT NOTE FOR:  OUTPATIENT  PARENTERAL ANTIBIOTIC THERAPY (OPAT)  Indication: MRSA/Ecoli bacteremia Regimen: Cefazolin 2g IV q8h + vancomycin 1500mg  IV q24h End date: 11/02/2017  IV antibiotic discharge orders are pended. To discharging provider:  please sign these orders via discharge navigator,  Select New Orders & click on the button choice - Manage This Unsigned Work.    Elicia Lamp, PharmD, BCPS Clinical Pharmacist Clinical phone for 10/07/2017 until 3:30pm: [phone] If after 3:30pm, please call main pharmacy at: [phone] 10/07/2017 2:04 PM

## 2017-10-07 NOTE — Care Management Note (Signed)
Case Management Note  Patient Details  Name: [name] MRN: [id number] Date of Birth: 12/19/1960  Subjective/Objective:           MRSA/Ecoli bacteremia         Jone Baseman (Spouse) Lesley Galentine (Brother)    [phone]      PCP: Mervyn Skeeters  Action/Plan:  LTAC vs home with home health services  Expected Discharge Date:  10/07/17               Expected Discharge Plan:  [place]  In-House Referral:  Clinical Social Work  Discharge planning Services  CM Consult  Post Acute Care Choice:    Choice offered to:  Patient  DME Arranged:  (home IV ABX) DME Agency:  [facility] if discharging to home [place] Arranged:  RN [place] Agency:  [facility] if discharging to home   Status of Service:  In process, will continue to follow  If discussed at Long Length of Stay Meetings, dates discussed:    Additional Comments:  Sharin Mons, RN 10/07/2017, 4:58 PM

## 2017-10-07 NOTE — Consult Note (Signed)
[facility] Surgery Consult Note  [name] 12-04-1960  [id number].    Requesting MD: Estill Cotta Chief Complaint/Reason for Consult: sebaceous cyst  HPI:  [name]  is a 57yo male PMH multiple myeloma, CHF, DM, CKD, HTN, and HLD, admitted to [facility] with MRSA bacteremia and recent Fournier's gangrene. General surgery asked to consult regarding sebaceous cyst. Patient states that this has been present for 4-5 months. He saw Dr. Arnoldo Morale in [place] earlier this year who advised that he try to express the contents of the cyst on his own. States that his wife has been pressing on it with good relief. Denies any purulent drainage or surrounding erythema, just the cheese-like material. Since admission it has been a little sore and will bleed at times. Pain is intermittent. States that there is "flesh" coming out of it now.  ROS: Review of Systems  Constitutional: Negative.   HENT: Negative.   Eyes: Negative.   Respiratory: Negative.   Cardiovascular: Negative.   Gastrointestinal: Negative.   Skin: Negative.   Neurological: Positive for weakness.    All systems reviewed and otherwise negative except for as above  Family History  Problem Relation Age of Onset  . Heart disease Mother   . Cancer Mother   . Diabetes Father   . Arthritis Unknown   . Anesthesia problems Neg Hx   . Hypotension Neg Hx   . Malignant hyperthermia Neg Hx   . Pseudochol deficiency Neg Hx     Past Medical History:  Diagnosis Date  . Abscess 04/2017   Scrotal  . Anemia   . Arteriosclerotic cardiovascular disease (ASCVD)    MI-2000s; stent to the proximal LAD and diagonal in 2001; stress nuclear in 2008-impaired exercise capacity, left ventricular dilatation, moderately to severely depressed EF, apical, inferior and anteroseptal scar  . Arthritis   . Atrial flutter ([place])   . 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 Cellulitis of leg    both legs  . Chronic diarrhea   . Chronic kidney  disease, stage 3, mod decreased GFR (HCC)    Creatinine of 1.84 in 06/2011 and 1.5 in 07/2011  . Diabetes mellitus    Insulin  . Dysrhythmia    AFlutter  . GERD (gastroesophageal reflux disease)   . Gout   . Hyperlipidemia   . Hypertension   . Injection site reaction   . Multiple myeloma 07/01/2011  . Myocardial infarction ([place]) 2000  . Obesity   . Pedal edema    Venous insufficiency  . Sleep apnea    uses cpap  . Ulcer     Past Surgical History:  Procedure Laterality Date  . ABSCESS DRAINAGE     Scrotal  . BIOPSY  01/02/2012   Procedure: BIOPSY;  Surgeon: Rogene Houston, MD;  [facility];  Service: Endoscopy;  Laterality: N/A;  . BONE MARROW BIOPSY  05/13/11  . CARDIAC CATHETERIZATION     cardiac stent  . CARDIOVERSION N/A 10/13/2012   Procedure: CARDIOVERSION;  Surgeon: Yehuda Savannah, MD;  Location: [facility] ORS;  Service: Cardiovascular;  Laterality: N/A;  . CATARACT EXTRACTION W/PHACO Left 02/13/2014   Procedure: CATARACT EXTRACTION PHACO AND INTRAOCULAR LENS PLACEMENT ([place]);  Surgeon: Tonny Branch, MD;  Location: [facility] ORS;  Service: Ophthalmology;  Laterality: Left;  CDE:  7.67  . CATARACT EXTRACTION W/PHACO Right 03/02/2014   Procedure: CATARACT EXTRACTION PHACO AND INTRAOCULAR LENS PLACEMENT RIGHT EYE CDE=16.81;  Surgeon: Tonny Branch, MD;  Location: [facility];  Service: Ophthalmology;  Laterality: Right;  . COLONOSCOPY  11/28/2011   Procedure: COLONOSCOPY;  Surgeon: Rogene Houston, MD;  Location: [facility];  Service: Endoscopy;  Laterality: N/A;  930  . CYSTOSCOPY W/ URETERAL STENT PLACEMENT Right 07/31/2017   Procedure: CYSTOSCOPY WITH RETROGRADE PYELOGRAM/URETERAL STENT PLACEMENT;  Surgeon: Cleon Gustin, MD;  Location: [facility];  Service: Urology;  Laterality: Right;  . ESOPHAGOGASTRODUODENOSCOPY  01/02/2012   Procedure: ESOPHAGOGASTRODUODENOSCOPY (EGD);  Surgeon: Rogene Houston, MD;  [facility];  Service: Endoscopy;  Laterality: N/A;  100  .  ESOPHAGOGASTRODUODENOSCOPY N/A 09/20/2012   Procedure: ESOPHAGOGASTRODUODENOSCOPY (EGD);  Surgeon: Rogene Houston, MD;  [facility];  Service: Endoscopy;  Laterality: N/A;  . EUS N/A 10/07/2012   Procedure: UPPER ENDOSCOPIC ULTRASOUND (EUS) LINEAR;  Surgeon: Milus Banister, MD;  [facility];  Service: Endoscopy;  Laterality: N/A;  . INCISION AND DRAINAGE ABSCESS N/A 04/14/2017   Procedure: INCISION AND DRAINAGE ABSCESS;  Surgeon: Ceasar Mons, MD;  [facility]: [facility];  Service: Urology;  Laterality: N/A;  . INCISION AND DRAINAGE ABSCESS ANAL    . IRRIGATION AND DEBRIDEMENT ABSCESS N/A 04/15/2017   Procedure: DEBRIDEMENT SCROTAL WOUND AND DRESSING CHANGE;  Surgeon: Ceasar Mons, MD;  [facility]: [facility];  Service: Urology;  Laterality: N/A;  . IRRIGATION AND DEBRIDEMENT ABSCESS N/A 04/17/2017   Procedure: IRRIGATION AND DEBRIDEMENT ABSCESS;  Surgeon: Ceasar Mons, MD;  [facility]: [facility];  Service: Urology;  Laterality: N/A;  RM 3  . LAPAROSCOPIC GASTRIC BANDING  2006   has been removed  . OSTECTOMY Right 04/08/2017   Procedure: OSTECTOMY RIGHT GREAT TOE;  Surgeon: Caprice Beaver, DPM;  Location: [facility];  Service: Podiatry;  Laterality: Right;  . PORT-A-CATH REMOVAL Left 12/07/2012   Procedure: REMOVAL PORT-A-CATH;  Surgeon: Scherry Ran, MD;  Location: [facility];  Service: General;  Laterality: Left;  . PORT-A-CATH REMOVAL N/A 10/02/2017   Procedure: MINOR REMOVAL PORT-A-CATH AT BEDSIDE;  Surgeon: Donnie Mesa, MD;  Location: [place];  Service: General;  Laterality: N/A;  . PORTACATH PLACEMENT  07/07/2011   Procedure: INSERTION PORT-A-CATH;  Surgeon: Scherry Ran, MD;  Location: [facility];  Service: General;  Laterality: N/A;  . PORTACATH PLACEMENT N/A 12/07/2012   Procedure: INSERTION PORT-A-CATH;  Surgeon: Scherry Ran, MD;  Location: [facility];  Service: General;  Laterality: N/A;  Attempted portacath placement on left and right side   . WOUND DEBRIDEMENT Right 04/08/2017   Procedure: EXCISION ULCERATION RIGHT GREAT TOE;  Surgeon: Caprice Beaver, DPM;  Location: [facility];  Service: Podiatry;  Laterality: Right;  . WRIST SURGERY     Left; removal of bone fragment    Social History:  reports that he quit smoking about 16 years ago. His smoking use included cigarettes and cigars. He has a 0.25 pack-year smoking history. He has never used smokeless tobacco. He reports that he does not drink alcohol or use drugs.  Allergies: No Known Allergies  Medications Prior to Admission  Medication Sig Dispense Refill  . acetaminophen (TYLENOL) 500 MG tablet Take 500-1,000 mg by mouth every 6 (six) hours as needed for mild pain or moderate pain.    Marland Kitchen acyclovir (ZOVIRAX) 400 MG tablet Take 1 tablet (400 mg total) by mouth every morning. 30 tablet 5  . allopurinol (ZYLOPRIM) 300 MG tablet TAKE ONE TABLET BY MOUTH ONCE DAILY. 30 tablet 2  . Artificial Tear Ointment (DRY EYES OP) Place 1 drop into both eyes as needed (dry eyes).    Marland Kitchen  aspirin EC 81 MG tablet Take 81 mg by mouth daily.    Marland Kitchen atorvastatin (LIPITOR) 80 MG tablet TAKE ONE TABLET BY MOUTH AT BEDTIME. 90 tablet 3  . calcitRIOL (ROCALTROL) 0.25 MCG capsule Take 0.25 mcg by mouth 3 (three) times a week. Monday, Wednesday, Friday.    . Calcium Carbonate (CALCIUM 600 PO) Take 1 tablet by mouth 2 (two) times daily.    Marland Kitchen dexlansoprazole (DEXILANT) 60 MG capsule Take 60 mg by mouth every morning.    . dicyclomine (BENTYL) 20 MG tablet TAKE 1 TABLET BY MOUTH BEFORE MEALS 3 TIMES DAILY. 90 tablet 3  . Eluxadoline (VIBERZI) 100 MG TABS Take 100 mg 2 (two) times daily with a meal by mouth.     . gabapentin (NEURONTIN) 300 MG capsule Take 1 capsule (300 mg total) by mouth 2 (two) times daily. 60 capsule 0  . insulin detemir (LEVEMIR) 100 UNIT/ML injection Inject 0.1 mLs (10 Units total) into the skin every morning. 10 mL 11  . insulin lispro (HUMALOG) 100 UNIT/ML injection Inject 0-0.1 mLs  (0-10 Units total) into the skin 2 (two) times daily with a meal. Sliding Scale per patient  CBG 150-200: 1 units CBG 201-275:  3 units CBG 275-350: 5 units 351-425: 7 units 425-500: 9 units 500+: 10 units 10 mL 0  . loratadine (CLARITIN) 10 MG tablet Take 10 mg by mouth every morning.     . Magnesium Cl-Calcium Carbonate (SLOW-MAG PO) Take 1 tablet by mouth every morning.    . methocarbamol (ROBAXIN) 500 MG tablet Take 1 tablet (500 mg total) by mouth every 8 (eight) hours as needed for muscle spasms. 15 tablet 0  . Multiple Vitamins-Minerals (MULTIVITAMINS THER. W/MINERALS) TABS Take 1 tablet by mouth daily.     . nitroGLYCERIN (NITROSTAT) 0.4 MG SL tablet DISSOLVE 1 TABLET UNDER TONGUE EVERY 5 MINUTES UP TO 15 MIN FOR CHESTPAIN. IF NO RELIEF CALL 911. 25 tablet 0  . potassium chloride SA (K-DUR,KLOR-CON) 20 MEQ tablet Take 1 tablet (20 mEq total) by mouth daily. 30 tablet 0  . sulfamethoxazole-trimethoprim (BACTRIM DS,SEPTRA DS) 800-160 MG tablet TAKE ONE TABLET BY MOUTH EVERY MONDAY, WEDNESDAY, AND FRIDAY. 12 tablet 0  . torsemide (DEMADEX) 20 MG tablet Take 1 tablet (20 mg total) by mouth daily. Take extra 1 tab for weight gain of 3 lbs in 1 day or 5 lbs in 2 days (Patient taking differently: Take 20 mg by mouth 2 (two) times daily. Take extra 1 tab for weight gain of 3 lbs in 1 day or 5 lbs in 2 days) 10 tablet 0  . Vitamin D, Ergocalciferol, (DRISDOL) 50000 units CAPS capsule TAKE 1 CAPSULE BY MOUTH EVERY THIRTY DAYS. 3 capsule PRN  . XARELTO 20 MG TABS tablet TAKE 1 TABLET BY MOUTH DAILY. 30 tablet 6  . sulfamethoxazole-trimethoprim (BACTRIM DS,SEPTRA DS) 800-160 MG tablet TAKE ONE TABLET BY MOUTH EVERY MONDAY, WEDNESDAY, AND FRIDAY. (Patient not taking: Reported on 09/30/2017) 12 tablet 0  . traMADol (ULTRAM) 50 MG tablet Take 1 tablet (50 mg total) by mouth every 6 (six) hours as needed. (Patient not taking: Reported on 09/30/2017) 20 tablet 0    Prior to Admission medications    Medication Sig Start Date End Date Taking? Authorizing Provider  acetaminophen (TYLENOL) 500 MG tablet Take 500-1,000 mg by mouth every 6 (six) hours as needed for mild pain or moderate pain.   Yes [provider]  acyclovir (ZOVIRAX) 400 MG tablet Take 1 tablet (400 mg  total) by mouth every morning. 02/18/17  Yes Twana First, MD  allopurinol (ZYLOPRIM) 300 MG tablet TAKE ONE TABLET BY MOUTH ONCE DAILY. 09/15/17  Yes Holley Bouche, NP  Artificial Tear Ointment (DRY EYES OP) Place 1 drop into both eyes as needed (dry eyes).   Yes [provider]  aspirin EC 81 MG tablet Take 81 mg by mouth daily.   Yes [provider]  atorvastatin (LIPITOR) 80 MG tablet TAKE ONE TABLET BY MOUTH AT BEDTIME. 12/18/16  Yes Herminio Commons, MD  calcitRIOL (ROCALTROL) 0.25 MCG capsule Take 0.25 mcg by mouth 3 (three) times a week. Monday, Wednesday, Friday. 12/14/13  Yes [provider]  Calcium Carbonate (CALCIUM 600 PO) Take 1 tablet by mouth 2 (two) times daily.   Yes [provider]  dexlansoprazole (DEXILANT) 60 MG capsule Take 60 mg by mouth every morning.   Yes [provider]  dicyclomine (BENTYL) 20 MG tablet TAKE 1 TABLET BY MOUTH BEFORE MEALS 3 TIMES DAILY. 09/11/17  Yes Rehman, Mechele Dawley, MD  Eluxadoline (VIBERZI) 100 MG TABS Take 100 mg 2 (two) times daily with a meal by mouth.    Yes [provider]  gabapentin (NEURONTIN) 300 MG capsule Take 1 capsule (300 mg total) by mouth 2 (two) times daily. 04/22/17  Yes Short, Noah Delaine, MD  insulin detemir (LEVEMIR) 100 UNIT/ML injection Inject 0.1 mLs (10 Units total) into the skin every morning. 04/22/17  Yes Short, Noah Delaine, MD  insulin lispro (HUMALOG) 100 UNIT/ML injection Inject 0-0.1 mLs (0-10 Units total) into the skin 2 (two) times daily with a meal. Sliding Scale per patient  CBG 150-200: 1 units CBG 201-275:  3 units CBG 275-350: 5 units 351-425: 7 units 425-500: 9 units 500+: 10  units 04/22/17  Yes Short, Mackenzie, MD  loratadine (CLARITIN) 10 MG tablet Take 10 mg by mouth every morning.    Yes [provider]  Magnesium Cl-Calcium Carbonate (SLOW-MAG PO) Take 1 tablet by mouth every morning.   Yes [provider]  methocarbamol (ROBAXIN) 500 MG tablet Take 1 tablet (500 mg total) by mouth every 8 (eight) hours as needed for muscle spasms. 08/04/17  Yes Lavina Hamman, MD  Multiple Vitamins-Minerals (MULTIVITAMINS THER. W/MINERALS) TABS Take 1 tablet by mouth daily.    Yes [provider]  nitroGLYCERIN (NITROSTAT) 0.4 MG SL tablet DISSOLVE 1 TABLET UNDER TONGUE EVERY 5 MINUTES UP TO 15 MIN FOR CHESTPAIN. IF NO RELIEF CALL 911. 05/11/17  Yes Holley Bouche, NP  potassium chloride SA (K-DUR,KLOR-CON) 20 MEQ tablet Take 1 tablet (20 mEq total) by mouth daily. 08/05/17  Yes Lavina Hamman, MD  sulfamethoxazole-trimethoprim (BACTRIM DS,SEPTRA DS) 800-160 MG tablet TAKE ONE TABLET BY MOUTH EVERY MONDAY, WEDNESDAY, AND FRIDAY. 09/20/17  Yes Holley Bouche, NP  torsemide (DEMADEX) 20 MG tablet Take 1 tablet (20 mg total) by mouth daily. Take extra 1 tab for weight gain of 3 lbs in 1 day or 5 lbs in 2 days Patient taking differently: Take 20 mg by mouth 2 (two) times daily. Take extra 1 tab for weight gain of 3 lbs in 1 day or 5 lbs in 2 days 08/05/17  Yes Lavina Hamman, MD  Vitamin D, Ergocalciferol, (DRISDOL) 50000 units CAPS capsule TAKE 1 CAPSULE BY MOUTH EVERY THIRTY DAYS. 05/11/17  Yes Holley Bouche, NP  XARELTO 20 MG TABS tablet TAKE 1 TABLET BY MOUTH DAILY. 09/21/17  Yes Herminio Commons, MD  sulfamethoxazole-trimethoprim (BACTRIM DS,SEPTRA  DS) 800-160 MG tablet TAKE ONE TABLET BY MOUTH EVERY MONDAY, WEDNESDAY, AND FRIDAY. Patient not taking: Reported on 09/30/2017 08/16/17   Holley Bouche, NP  traMADol (ULTRAM) 50 MG tablet Take 1 tablet (50 mg total) by mouth every 6 (six) hours as needed. Patient not taking: Reported on 09/30/2017  08/11/17   Milton Ferguson, MD    Blood pressure 116/70, pulse 65, temperature 98.2 F (36.8 C), temperature source Oral, resp. rate 18, height _0  (1.88 m), weight (!) 157.4 kg (347 lb), SpO2 100 %. Physical Exam: General: pleasant, WD/WN AA male who is laying in bed in NAD HEENT: head is normocephalic, atraumatic.  Sclera are noninjected.  Pupils equal and round.  Ears and nose without any masses or lesions.  Mouth is pink and moist. Dentition fair Heart: regular, rate, and rhythm.  No obvious murmurs, gallops, or rubs noted.  Palpable pedal pulses bilaterally Lungs: CTAB, no wheezes, rhonchi, or rales noted.  Respiratory effort nonlabored Abd: obese, soft, NT/ND, +BS, no masses, hernias, organomegaly MS: calves soft and nontender. 1+ pitting edema BLE Skin: sebaceous cyst lower back to the left of midline with cheese-like material expressed with palpation; no surrounding erythema, purulent drainage, or fluctuance; small skin tag adjacent  Psych: A&Ox3 with an appropriate affect. Neuro: cranial nerves grossly intact, extremity CSM intact bilaterally, normal speech  Results for orders placed or performed during the hospital encounter of 09/30/17 (from the past 48 hour(s))  Heparin level (unfractionated)     Status: None   Collection Time: 10/05/17  2:02 PM  Result Value Ref Range   Heparin Unfractionated 0.67 0.30 - 0.70 IU/mL    Comment:        IF HEPARIN RESULTS ARE BELOW EXPECTED VALUES, AND PATIENT DOSAGE HAS BEEN CONFIRMED, SUGGEST FOLLOW UP TESTING OF ANTITHROMBIN III LEVELS. Performed at [facility], [place] [address], [place], [place] [address]   Glucose, capillary     Status: Abnormal   Collection Time: 10/05/17  3:21 PM  Result Value Ref Range   Glucose-Capillary 136 (H) 65 - 99 mg/dL  Heparin level (unfractionated)     Status: None   Collection Time: 10/05/17  8:27 PM  Result Value Ref Range   Heparin Unfractionated 0.35 0.30 - 0.70 IU/mL    Comment:        IF  HEPARIN RESULTS ARE BELOW EXPECTED VALUES, AND PATIENT DOSAGE HAS BEEN CONFIRMED, SUGGEST FOLLOW UP TESTING OF ANTITHROMBIN III LEVELS. Performed at [facility], [place] [address] [place], [place] [address]   Glucose, capillary     Status: Abnormal   Collection Time: 10/05/17  9:55 PM  Result Value Ref Range   Glucose-Capillary 111 (H) 65 - 99 mg/dL  Heparin level (unfractionated)     Status: Abnormal   Collection Time: 10/06/17  4:53 AM  Result Value Ref Range   Heparin Unfractionated 0.15 (L) 0.30 - 0.70 IU/mL    Comment:        IF HEPARIN RESULTS ARE BELOW EXPECTED VALUES, AND PATIENT DOSAGE HAS BEEN CONFIRMED, SUGGEST FOLLOW UP TESTING OF ANTITHROMBIN III LEVELS. Performed at [facility], [place] [address] [place], [place] [address]   Comprehensive metabolic panel     Status: Abnormal   Collection Time: 10/06/17  4:53 AM  Result Value Ref Range   Sodium 143 135 - 145 mmol/L   Potassium 3.1 (L) 3.5 - 5.1 mmol/L   Chloride 111 101 - 111 mmol/L   CO2 23 22 -  32 mmol/L   Glucose, Bld 125 (H) 65 - 99 mg/dL   BUN 40 (H) 6 - 20 mg/dL   Creatinine, Ser 1.47 (H) 0.61 - 1.24 mg/dL   Calcium 8.2 (L) 8.9 - 10.3 mg/dL   Total Protein 5.2 (L) 6.5 - 8.1 g/dL   Albumin 2.2 (L) 3.5 - 5.0 g/dL   AST 20 15 - 41 U/L   ALT 21 17 - 63 U/L   Alkaline Phosphatase 81 38 - 126 U/L   Total Bilirubin 0.5 0.3 - 1.2 mg/dL   GFR calc non Af Amer 51 (L) >60 mL/min   GFR calc Af Amer 59 (L) >60 mL/min    Comment: (NOTE) The eGFR has been calculated using the CKD EPI equation. This calculation has not been validated in all clinical situations. eGFR's persistently <60 mL/min signify possible Chronic Kidney Disease.    Anion gap 9 5 - 15    Comment: Performed at [facility] [address], [place], [place] [address]  CBC     Status: Abnormal   Collection Time: 10/06/17  4:53 AM  Result Value Ref Range   WBC 13.0 (H) 4.0 - 10.5 K/uL   RBC 2.86 (L) 4.22 - 5.81 MIL/uL    Hemoglobin 8.5 (L) 13.0 - 17.0 g/dL   HCT 26.0 (L) 39.0 - 52.0 %   MCV 90.9 78.0 - 100.0 fL   MCH 29.7 26.0 - 34.0 pg   MCHC 32.7 30.0 - 36.0 g/dL   RDW 17.9 (H) 11.5 - 15.5 %   Platelets 137 (L) 150 - 400 K/uL    Comment: Performed at [facility], [place] [address] [place], [place] [address]  Magnesium     Status: None   Collection Time: 10/06/17  4:53 AM  Result Value Ref Range   Magnesium 1.9 1.7 - 2.4 mg/dL    Comment: Performed at [facility] [address], [place], [place] [address]  Phosphorus     Status: None   Collection Time: 10/06/17  4:53 AM  Result Value Ref Range   Phosphorus 2.7 2.5 - 4.6 mg/dL    Comment: Performed at [facility] [address] [place], [place] [address]  Glucose, capillary     Status: Abnormal   Collection Time: 10/06/17  8:01 AM  Result Value Ref Range   Glucose-Capillary 133 (H) 65 - 99 mg/dL  Glucose, capillary     Status: Abnormal   Collection Time: 10/06/17 12:18 PM  Result Value Ref Range   Glucose-Capillary 150 (H) 65 - 99 mg/dL  Heparin level (unfractionated)     Status: Abnormal   Collection Time: 10/06/17  2:08 PM  Result Value Ref Range   Heparin Unfractionated <0.10 (L) 0.30 - 0.70 IU/mL    Comment:        IF HEPARIN RESULTS ARE BELOW EXPECTED VALUES, AND PATIENT DOSAGE HAS BEEN CONFIRMED, SUGGEST FOLLOW UP TESTING OF ANTITHROMBIN III LEVELS. REPEATED TO VERIFY Performed at [facility], [place] [address], [place], [place] [address]   Glucose, capillary     Status: Abnormal   Collection Time: 10/06/17  4:46 PM  Result Value Ref Range   Glucose-Capillary 140 (H) 65 - 99 mg/dL  Glucose, capillary     Status: Abnormal   Collection Time: 10/06/17  9:24 PM  Result Value Ref Range   Glucose-Capillary 116 (H) 65 - 99 mg/dL  Basic metabolic panel     Status: Abnormal  Collection Time: 10/07/17  4:21 AM  Result Value Ref Range   Sodium 143 135 - 145 mmol/L   Potassium 3.4 (L) 3.5 - 5.1 mmol/L    Chloride 113 (H) 101 - 111 mmol/L   CO2 23 22 - 32 mmol/L   Glucose, Bld 144 (H) 65 - 99 mg/dL   BUN 30 (H) 6 - 20 mg/dL   Creatinine, Ser 1.34 (H) 0.61 - 1.24 mg/dL   Calcium 8.0 (L) 8.9 - 10.3 mg/dL   GFR calc non Af Amer 57 (L) >60 mL/min   GFR calc Af Amer >60 >60 mL/min    Comment: (NOTE) The eGFR has been calculated using the CKD EPI equation. This calculation has not been validated in all clinical situations. eGFR's persistently <60 mL/min signify possible Chronic Kidney Disease.    Anion gap 7 5 - 15    Comment: Performed at [facility] [address], [place], [place] [address]  CBC     Status: Abnormal   Collection Time: 10/07/17  4:21 AM  Result Value Ref Range   WBC 10.7 (H) 4.0 - 10.5 K/uL   RBC 2.78 (L) 4.22 - 5.81 MIL/uL   Hemoglobin 8.2 (L) 13.0 - 17.0 g/dL   HCT 25.5 (L) 39.0 - 52.0 %   MCV 91.7 78.0 - 100.0 fL   MCH 29.5 26.0 - 34.0 pg   MCHC 32.2 30.0 - 36.0 g/dL   RDW 18.1 (H) 11.5 - 15.5 %   Platelets 141 (L) 150 - 400 K/uL    Comment: Performed at [facility], [place] [address], [place], [place] [address]  Glucose, capillary     Status: Abnormal   Collection Time: 10/07/17  7:56 AM  Result Value Ref Range   Glucose-Capillary 137 (H) 65 - 99 mg/dL  Glucose, capillary     Status: Abnormal   Collection Time: 10/07/17 11:58 AM  Result Value Ref Range   Glucose-Capillary 159 (H) 65 - 99 mg/dL   *Note: Due to a large number of results and/or encounters for the requested time period, some results have not been displayed. A complete set of results can be found in Results Review.   [place] Ekg Site Rite  Result Date: 10/06/2017 If Site Rite image not attached, placement could not be confirmed due to current cardiac rhythm.     Assessment/Plan Sebaceous cyst - Present for the last several months and does not look acutely infected. Does not need any urgent surgical intervention. Patient has seen Dr. Arnoldo Morale in [place] for this before and plans to  follow up with him outpatient once discharged from the hospital. General surgery will sign off, please call with concerns.  Wellington Hampshire, [facility] Surgery 10/07/2017, 12:59 PM Pager: [phone] Consults: [phone] Mon-Fri 7:00 am-4:30 pm Sat-Sun 7:00 am-11:30 am

## 2017-10-07 NOTE — Progress Notes (Signed)
[facility]                                                                              Patient Demographics  Brandan Robicheaux, is a 57 y.o. male, DOB - December 25, 1960, YIA:[id number]  Admit date - 09/30/2017   Admitting Physician Wilhelmina Mcardle, MD  Outpatient Primary MD for the patient is [name], MD  Outpatient specialists:   LOS - 7  days   Medical records reviewed and are as summarized below:    Chief Complaint  Patient presents with  . Altered Mental Status       Brief summary   57 year old male with hx of morbid obesity, multiple myeloma on chronic steroids, systolic CHF, HTN, GERD, aflutter on Xarelto, CKD stage 3, anemia, DM, and chronic foley who presented to [facility] ER with altered mental status.  He required tx for Fournier's gangrene 04/2017.  Returned home from rehab in December.  Klebsiella bacteremia from obstructing ureteral stone with ureteral stent placement 3/1.  Home health exchanged foley on 4/30 and afterwards his wife noted altered mental status, decreased PO intake, and fever.   On arrival to ER he was febrile, minimally responsive, and hypotensive. Labs noted for WBC 21.4, Cr 3.48 (up from 1.54 on 3/15), lactic 3.77.   Assessment & Plan    Principal Problem:   Septic shock ([place]) with E. coli bacteremia, MRSA bacteremia At the time of presentation,-patient had septic shock with minimally responsive, hypotensive, febrile, leukocytosis, acute kidney injury, lactic acidosis -Patient was intubated on 5/2, transferred to ICU, was placed on low-dose pressors.  Suspected right chest port infection, hence removed.  Patient was extubated on 5/5.  -Currently improving, ID following, recommended vancomycin and Ancef for 4 weeks from the last negative blood cultures -Place Opat consult, patient not agreeing for skilled nursing facility however wants to stay inpatient for PT to be " strong enough" to go home.  Explained to the patient that  only way he can get rehab while inpatient is CIR versus home health PT  Active Problems:   Urinary tract infection with hematuria    Fournier's gangrene of scrotum -Currently improving, wound on the scrotum, wound care following, outpatient follow-up with urology  Sebaceous cyst on the back -On exam, not draining or bleeding, patient reports that it is bothering him and is larger now.  Requested general surgery if there is any role of excision versus outpatient follow-up    AKI (acute kidney injury) ([place]) -Creatinine 3.4 at the time of admission due to #1 - Currently improving, baseline 1.4, stable   Acute metabolic encephalopathy -Likely due to septic shock, bacteremia, now improving, alert and oriented x4    Acute respiratory failure with hypoxia ([place]) Improved, patient was intubated and extubated due to septic shock, currently O2 sats 100% on room air  History of multiple myeloma -Previously on maintenance with lenalidomide and dexamethasone, currently off due to recurrent severe infections, outpatient follow-up with oncology  Chronic systolic CHF -EF 35 to 70% per echo October 2017, currently stable, compensated  Chronic Foley cath, right ureteral stent, placed to 07/31/2017 -Outpatient follow-up with urology.  Polymyoclonus - Resolved off meropenem and ciprofloxacin, EEG unremarkable  Morbid obesity BMI 44.5, counseled on diet and weight control  Code Status: Full CODE STATUS DVT Prophylaxis: Heparin Family Communication: Discussed in detail with the patient, all imaging results, lab results explained to the patient    Disposition Plan: If declined by CIR, will DC home with home health  Time Spent in minutes  35 minutes  Procedures:  Patient, extubation  Consultants:   P CCM, ID, neurology  Antimicrobials:   Vancomycin 5/1  Ancef 5/5   Medications  Scheduled Meds: . acyclovir  400 mg Oral q morning - 10a  . allopurinol  300 mg Oral Daily  .  atorvastatin  80 mg Oral QHS  . gabapentin  100 mg Oral BID  . insulin aspart  0-20 Units Subcutaneous TID WC  . insulin aspart  0-5 Units Subcutaneous QHS  . insulin detemir  10 Units Subcutaneous Daily  . mouth rinse  15 mL Mouth Rinse BID  . pantoprazole  40 mg Oral Q1200  . rivaroxaban  20 mg Oral Q supper  . torsemide  20 mg Oral BID   Continuous Infusions: .  ceFAZolin (ANCEF) IV Stopped (10/07/17 0631)  . vancomycin Stopped (10/06/17 1849)   PRN Meds:.[DISCONTINUED] acetaminophen **OR** acetaminophen, chlorproMAZINE, fentaNYL (SUBLIMAZE) injection, ondansetron (ZOFRAN) IV, oxyCODONE, sodium chloride flush   Antibiotics   Anti-infectives (From admission, onward)   Start     Dose/Rate Route Frequency Ordered Stop   10/05/17 1200  acyclovir (ZOVIRAX) tablet 400 mg     400 mg Oral  Every morning - 10a 10/05/17 1052     10/04/17 1400  ceFAZolin (ANCEF) IVPB 2g/100 mL premix     2 g 200 mL/hr over 30 Minutes Intravenous Every 8 hours 10/04/17 1351     10/03/17 0600  vancomycin (VANCOCIN) 2,000 mg in sodium chloride 0.9 % 500 mL IVPB  Status:  Discontinued     2,000 mg 250 mL/hr over 120 Minutes Intravenous Every 48 hours 10/01/17 1352 10/02/17 1340   10/02/17 2000  meropenem (MERREM) 1 g in sodium chloride 0.9 % 100 mL IVPB  Status:  Discontinued     1 g 200 mL/hr over 30 Minutes Intravenous Every 12 hours 10/02/17 1150 10/02/17 1803   10/02/17 2000  cefTRIAXone (ROCEPHIN) 2 g in sodium chloride 0.9 % 100 mL IVPB  Status:  Discontinued     2 g 200 mL/hr over 30 Minutes Intravenous Every 24 hours 10/02/17 1803 10/04/17 1344   10/02/17 1400  vancomycin (VANCOCIN) 1,750 mg in sodium chloride 0.9 % 500 mL IVPB     1,750 mg 250 mL/hr over 120 Minutes Intravenous Every 24 hours 10/02/17 1340     10/02/17 0600  vancomycin (VANCOCIN) 1,750 mg in sodium chloride 0.9 % 500 mL IVPB  Status:  Discontinued     1,750 mg 250 mL/hr over 120 Minutes Intravenous Every 24 hours 10/01/17 1350  10/01/17 1352   10/01/17 2000  ceFEPIme (MAXIPIME) 2 g in sodium chloride 0.9 % 100 mL IVPB  Status:  Discontinued     2 g 200 mL/hr over 30 Minutes Intravenous Every 12 hours 10/01/17 1800 10/02/17 1150   10/01/17 1830  ciprofloxacin (CIPRO) IVPB 400 mg  Status:  Discontinued     400 mg 200 mL/hr over 60 Minutes Intravenous Every 12 hours 10/01/17 1800 10/02/17 1803   10/01/17 0600  vancomycin (VANCOCIN) 1,750 mg in sodium chloride 0.9 % 500 mL IVPB  Status:  Discontinued  1,750 mg 250 mL/hr over 120 Minutes Intravenous Every 24 hours 10/01/17 0536 10/01/17 1255   10/01/17 0600  piperacillin-tazobactam (ZOSYN) IVPB 3.375 g  Status:  Discontinued     3.375 g 12.5 mL/hr over 240 Minutes Intravenous Every 8 hours 10/01/17 0536 10/01/17 1736   09/30/17 1815  vancomycin (VANCOCIN) IVPB 1000 mg/200 mL premix     1,000 mg 200 mL/hr over 60 Minutes Intravenous  Once 09/30/17 1802 09/30/17 1928   09/30/17 1815  piperacillin-tazobactam (ZOSYN) IVPB 3.375 g     3.375 g 100 mL/hr over 30 Minutes Intravenous  Once 09/30/17 1802 09/30/17 1847        Subjective:   Claudell Wohler was seen and examined today.  Feels weak and wants to do more PT, able to stand up before going home.  No fevers or chills.  Sebaceous cyst bothering him.  Feels deconditioned.  Patient denies dizziness, chest pain, abdominal pain, N/V/D/C, new weakness, numbess, tingling. No acute events overnight.    Objective:   Vitals:   10/06/17 1604 10/06/17 1807 10/06/17 2112 10/07/17 0544  BP: (!) 95/42 117/72 133/76 116/70  Pulse:  74 76 65  Resp: _0 Temp: 98.4 F (36.9 C)  99.3 F (37.4 C) 98.2 F (36.8 C)  TempSrc: Oral  Oral Oral  SpO2: 97% 97% 100% 100%  Weight:      Height:        Intake/Output Summary (Last 24 hours) at 10/07/2017 1132 Last data filed at 10/07/2017 0900 Gross per 24 hour  Intake 2065.43 ml  Output 900 ml  Net 1165.43 ml     Wt Readings from Last 3 Encounters:  10/06/17 (!) 157.4  kg (347 lb)  08/14/17 (!) 164.2 kg (361 lb 15.9 oz)  08/11/17 (!) 172.8 kg (381 lb)     Exam  General: Alert and oriented x 3, NAD  Eyes:   HEENT:    Cardiovascular: S1 S2 auscultated,  Regular rate and rhythm.  Respiratory: Clear to auscultation bilaterally, no wheezing, rales or rhonchi  Gastrointestinal: Soft, nontender, nondistended, + bowel sounds  Ext: 1 + pedal edema bilaterally  Neuro: global generalized weakness, no focal weakness  Musculoskeletal: No digital cyanosis, clubbing  Skin: No rashes  Psych: Normal affect and demeanor, alert and oriented x3   Back: Sebaceous cyst in the lower mid back, not draining or bleeding   Data Reviewed:  I have personally reviewed following labs and imaging studies  Micro Results Recent Results (from the past 240 hour(s))  Culture, blood (Routine x 2)     Status: Abnormal   Collection Time: 09/30/17  5:32 PM  Result Value Ref Range Status   Specimen Description   Final    RIGHT ANTECUBITAL Performed at [facility], [address], [place], [place] [address]    Special Requests   Final    BOTTLES DRAWN AEROBIC AND ANAEROBIC Blood Culture adequate volume Performed at [facility], [address] [place], [place] [address]    Culture  Setup Time   Final    GRAM NEGATIVE RODS ANAEROBIC BOTTLE Gram Stain Report Called to,Read Back By and Verified With: FIELDS S. AT [place] AT 0920A ON [id number] BY THOMPSON S. GRAM POSITIVE COCCI IN CLUSTERS RECOVERED FROM THE AEROBIC BOTTLE Gram Stain Report Called to,Read Back By and Verified With: JOHNSON,J. AT 0037 ON 10/01/2017 BY BAUGHAM,M. Performed at [place].  VALUE IS CONSISTENT WITH PREVIOUSLY REPORTED AND CALLED VALUE. Performed at [facility], [place] [address], [place], [place] [address]    Culture (A)  Final    METHICILLIN RESISTANT STAPHYLOCOCCUS AUREUS ESCHERICHIA COLI    Report Status 10/03/2017 FINAL  Final   Organism ID, Bacteria  METHICILLIN RESISTANT STAPHYLOCOCCUS AUREUS  Final   Organism ID, Bacteria ESCHERICHIA COLI  Final      Susceptibility   Escherichia coli - MIC*    AMPICILLIN >=32 RESISTANT Resistant     CEFAZOLIN <=4 SENSITIVE Sensitive     CEFEPIME <=1 SENSITIVE Sensitive     CEFTAZIDIME <=1 SENSITIVE Sensitive     CEFTRIAXONE <=1 SENSITIVE Sensitive     CIPROFLOXACIN >=4 RESISTANT Resistant     GENTAMICIN 8 INTERMEDIATE Intermediate     IMIPENEM <=0.25 SENSITIVE Sensitive     TRIMETH/SULFA >=320 RESISTANT Resistant     AMPICILLIN/SULBACTAM >=32 RESISTANT Resistant     PIP/TAZO <=4 SENSITIVE Sensitive     Extended ESBL NEGATIVE Sensitive     * ESCHERICHIA COLI   Methicillin resistant staphylococcus aureus - MIC*    CIPROFLOXACIN >=8 RESISTANT Resistant     ERYTHROMYCIN >=8 RESISTANT Resistant     GENTAMICIN <=0.5 SENSITIVE Sensitive     OXACILLIN >=4 RESISTANT Resistant     TETRACYCLINE >=16 RESISTANT Resistant     VANCOMYCIN 1 SENSITIVE Sensitive     TRIMETH/SULFA >=320 RESISTANT Resistant     CLINDAMYCIN >=8 RESISTANT Resistant     RIFAMPIN <=0.5 SENSITIVE Sensitive     Inducible Clindamycin NEGATIVE Sensitive     * METHICILLIN RESISTANT STAPHYLOCOCCUS AUREUS  Urine culture     Status: Abnormal   Collection Time: 09/30/17  5:32 PM  Result Value Ref Range Status   Specimen Description   Final    URINE, RANDOM Performed at [facility], [address], [place], [place] [address]    Special Requests   Final    NONE Performed at [facility], [address], [place], [place] [address]    Culture (A)  Final    >=100,000 COLONIES/mL ESCHERICHIA COLI >=100,000 COLONIES/mL KLEBSIELLA PNEUMONIAE    Report Status 10/04/2017 FINAL  Final   Organism ID, Bacteria ESCHERICHIA COLI (A)  Final   Organism ID, Bacteria KLEBSIELLA PNEUMONIAE (A)  Final      Susceptibility   Escherichia coli - MIC*    AMPICILLIN >=32 RESISTANT Resistant     CEFAZOLIN <=4 SENSITIVE Sensitive     CEFTRIAXONE <=1  SENSITIVE Sensitive     CIPROFLOXACIN >=4 RESISTANT Resistant     GENTAMICIN 4 SENSITIVE Sensitive     IMIPENEM <=0.25 SENSITIVE Sensitive     NITROFURANTOIN <=16 SENSITIVE Sensitive     TRIMETH/SULFA >=320 RESISTANT Resistant     AMPICILLIN/SULBACTAM >=32 RESISTANT Resistant     PIP/TAZO <=4 SENSITIVE Sensitive     Extended ESBL NEGATIVE Sensitive     * >=100,000 COLONIES/mL ESCHERICHIA COLI   Klebsiella pneumoniae - MIC*    AMPICILLIN >=32 RESISTANT Resistant     CEFAZOLIN <=4 SENSITIVE Sensitive     CEFTRIAXONE <=1 SENSITIVE Sensitive     CIPROFLOXACIN 2 INTERMEDIATE Intermediate     GENTAMICIN <=1 SENSITIVE Sensitive     IMIPENEM <=0.25 SENSITIVE Sensitive     NITROFURANTOIN 128 RESISTANT Resistant     TRIMETH/SULFA >=320 RESISTANT Resistant     AMPICILLIN/SULBACTAM >=32 RESISTANT Resistant     PIP/TAZO 16 SENSITIVE Sensitive     Extended ESBL NEGATIVE Sensitive     * >=100,000 COLONIES/mL KLEBSIELLA PNEUMONIAE  Culture, blood (Routine x 2)  Status: Abnormal   Collection Time: 09/30/17  5:37 PM  Result Value Ref Range Status   Specimen Description   Final    RIGHT ANTECUBITAL Performed at [facility], [address], [place], [place] [address]    Special Requests   Final    BOTTLES DRAWN AEROBIC AND ANAEROBIC Blood Culture adequate volume Performed at [facility], [address], [place], [place] [address]    Culture  Setup Time   Final    GRAM NEGATIVE RODS ANAEROBIC BOTTLE ONLY Gram Stain Report Called to,Read Back By and Verified With: FIELDS S. AT [place] AT 0920A ON [id number] BY THOMPSON S. GRAM POSITIVE COCCI IN CLUSTERS AEROBIC BOTTLE ONLY Gram Stain Report Called to,Read Back By and Verified With: JOHNSON,J. AT 4332 ON 10/01/2017 BY BAUGHAM,M. Performed at [facility]    Culture (A)  Final    STAPHYLOCOCCUS AUREUS SUSCEPTIBILITIES PERFORMED ON PREVIOUS CULTURE WITHIN THE LAST 5 DAYS. ESCHERICHIA COLI    Report Status 10/05/2017 FINAL  Final  Blood Culture  ID Panel (Reflexed)     Status: Abnormal   Collection Time: 09/30/17  5:37 PM  Result Value Ref Range Status   Enterococcus species NOT DETECTED NOT DETECTED Final   Listeria monocytogenes NOT DETECTED NOT DETECTED Final   Staphylococcus species DETECTED (A) NOT DETECTED Final    Comment: CRITICAL RESULT CALLED TO, READ BACK BY AND VERIFIED WITH: Asencion Islam PharmD 16:05 10/01/17 (wilsonm)    Staphylococcus aureus DETECTED (A) NOT DETECTED Final    Comment: Methicillin (oxacillin)-resistant Staphylococcus aureus (MRSA). MRSA is predictably resistant to beta-lactam antibiotics (except ceftaroline). Preferred therapy is vancomycin unless clinically contraindicated. Patient requires contact precautions if  hospitalized. CRITICAL RESULT CALLED TO, READ BACK BY AND VERIFIED WITH: Asencion Islam PharmD 16:05 10/01/17 (wilsonm)    Methicillin resistance DETECTED (A) NOT DETECTED Final    Comment: CRITICAL RESULT CALLED TO, READ BACK BY AND VERIFIED WITH: Asencion Islam PharmD 16:05 10/01/17 (wilsonm)    Streptococcus species NOT DETECTED NOT DETECTED Final   Streptococcus agalactiae NOT DETECTED NOT DETECTED Final   Streptococcus pneumoniae NOT DETECTED NOT DETECTED Final   Streptococcus pyogenes NOT DETECTED NOT DETECTED Final   Acinetobacter baumannii NOT DETECTED NOT DETECTED Final   Enterobacteriaceae species DETECTED (A) NOT DETECTED Final    Comment: Enterobacteriaceae represent a large family of gram-negative bacteria, not a single organism. CRITICAL RESULT CALLED TO, READ BACK BY AND VERIFIED WITH: Jacinto Reap Mancheril PharmD 16:05 10/01/17 (wilsonm)    Enterobacter cloacae complex NOT DETECTED NOT DETECTED Final   Escherichia coli DETECTED (A) NOT DETECTED Final    Comment: CRITICAL RESULT CALLED TO, READ BACK BY AND VERIFIED WITH: Asencion Islam PharmD 16:05 10/01/17 (wilsonm)    Klebsiella oxytoca NOT DETECTED NOT DETECTED Final   Klebsiella pneumoniae NOT DETECTED NOT DETECTED Final   Proteus  species NOT DETECTED NOT DETECTED Final   Serratia marcescens NOT DETECTED NOT DETECTED Final   Carbapenem resistance NOT DETECTED NOT DETECTED Final   Haemophilus influenzae NOT DETECTED NOT DETECTED Final   Neisseria meningitidis NOT DETECTED NOT DETECTED Final   Pseudomonas aeruginosa NOT DETECTED NOT DETECTED Final   Candida albicans NOT DETECTED NOT DETECTED Final   Candida glabrata NOT DETECTED NOT DETECTED Final   Candida krusei NOT DETECTED NOT DETECTED Final   Candida parapsilosis NOT DETECTED NOT DETECTED Final   Candida tropicalis NOT DETECTED NOT DETECTED Final    Comment: Performed at [facility], [place] [address], [place], [place] [address]  MRSA  PCR Screening     Status: Abnormal   Collection Time: 10/01/17  2:26 AM  Result Value Ref Range Status   MRSA by PCR POSITIVE (A) NEGATIVE Final    Comment:        The GeneXpert MRSA Assay (FDA approved for NASAL specimens only), is one component of a comprehensive MRSA colonization surveillance program. It is not intended to diagnose MRSA infection nor to guide or monitor treatment for MRSA infections. RESULT CALLED TO, READ BACK BY AND VERIFIED WITH: B SHEPARD RN 10/01/17 0438 JDW   Culture, blood (routine x 2)     Status: None   Collection Time: 10/02/17  6:00 AM  Result Value Ref Range Status   Specimen Description BLOOD LEFT HAND  Final   Special Requests   Final    BOTTLES DRAWN AEROBIC ONLY Blood Culture adequate volume   Culture   Final    NO GROWTH 5 DAYS Performed at [facility], [place] [address] [place], [place] [address]    Report Status 10/07/2017 FINAL  Final  Culture, blood (routine x 2)     Status: None   Collection Time: 10/02/17  6:14 AM  Result Value Ref Range Status   Specimen Description BLOOD RIGHT HAND  Final   Special Requests   Final    BOTTLES DRAWN AEROBIC ONLY Blood Culture adequate volume   Culture   Final    NO GROWTH 5 DAYS Performed at [facility], [place] [address], [place], [place] [address]    Report Status 10/07/2017 FINAL  Final  C difficile quick scan w PCR reflex     Status: None   Collection Time: 10/04/17  6:00 PM  Result Value Ref Range Status   C Diff antigen NEGATIVE NEGATIVE Final   C Diff toxin NEGATIVE NEGATIVE Final   C Diff interpretation No C. difficile detected.  Final    Comment: Performed at [facility], [place] [address] [place], [place] [address]    Radiology Reports Ct Abdomen Pelvis Wo Contrast  Result Date: 09/30/2017 CLINICAL DATA:  57 year old male presents with hematuria, sepsis and fever. Hypotension. History of obstructing ureteral stones. EXAM: CT ABDOMEN AND PELVIS WITHOUT CONTRAST TECHNIQUE: Multidetector CT imaging of the abdomen and pelvis was performed following the standard protocol without IV contrast. COMPARISON:  07/30/2017 FINDINGS: Lower chest: New bibasilar pulmonary consolidations likely reflecting atelectasis, left greater than right. Superimposed pneumonia would be difficult to entirely exclude given history of fever and sepsis. Heart size is top normal in size without pericardial effusion. Hepatobiliary: 6 mm dependent gallstone within a non thickened but distended gallbladder. This could be due to a fasting state. The unenhanced liver is unremarkable. Pancreas: No ductal dilatation or mass given limitations of a noncontrast study. Spleen: Normal size spleen. Adrenals/Urinary Tract: Normal bilateral adrenal glands. Right-sided double pigtail ureteral stent is identified with the proximal coil in the right renal pelvis and distal coil in the decompressed urinary bladder. No calculus identified along the course of the ureteral stent. A Foley catheter decompresses the urinary bladder. Left kidney is unremarkable apart from a punctate nonobstructing left renal calculus. Moderate perinephric fat stranding is noted. Stomach/Bowel: Small hiatal hernia. Decompressed stomach with normal small bowel rotation. Normal  appendix. Minimal scattered colonic diverticulosis without acute diverticulitis along the left colon. Vascular/Lymphatic: No aortic aneurysm. Aortoiliac and branch vessel atherosclerosis. No aneurysm or adenopathy. Reproductive: Normal size prostate and seminal vesicles. Other: No free air. Small fat containing umbilical  hernia. Small fat containing right inguinal hernia. Musculoskeletal: Thoracolumbar spondylosis with multilevel degenerative disc disease most marked from L2 through S1 in the lumbar spine. Associated facet arthropathy is seen. IMPRESSION: 1. Uncomplicated cholelithiasis. 2. Nonobstructing left lower pole renal calculus. No hydroureteronephrosis. Stable right double pigtail ureteral stent in place with decompressed bladder containing a Foley catheter. 3. Patchy airspace opacities at each lung base, left greater than right compatible with atelectasis. Minimal superimposed pneumonia is not excluded given history of fever and sepsis. 4. Thoracolumbar spondylosis. Electronically Signed   By: Ashley Royalty M.D.   On: 09/30/2017 20:40   Dg Chest Port 1 View  Result Date: 10/05/2017 CLINICAL DATA:  Acute respiratory failure EXAM: PORTABLE CHEST 1 VIEW COMPARISON:  10/04/2017 FINDINGS: Cardiac shadow is stable. Elevation of left hemidiaphragm is again noted. Increasing vascular congestion and right basilar infiltrate is seen. Endotracheal tube and nasogastric catheter have been removed in the interval. IMPRESSION: Increasing right basilar infiltrate. Changes consistent with congestive failure. Electronically Signed   By: Inez Catalina M.D.   On: 10/05/2017 08:00   Dg Chest Port 1 View  Result Date: 10/04/2017 CLINICAL DATA:  57 year old male with history of acute respiratory failure with hypoxia. EXAM: PORTABLE CHEST 1 VIEW COMPARISON:  Chest x-ray 10/03/2017. FINDINGS: An endotracheal tube is in place with tip 5.1 cm above the carina. A nasogastric tube is seen extending into the stomach, however, the  tip of the nasogastric tube extends below the lower margin of the image. Severe eventration of the lateral left hemidiaphragm, similar to prior studies. Low lung volumes. Bibasilar opacities (left greater than right) may reflect areas of atelectasis and/or airspace consolidation. No evidence of pulmonary edema. No pneumothorax. Heart size appears mildly enlarged. Upper mediastinal contours are within normal limits. IMPRESSION: 1. Support apparatus, as above. 2. Otherwise, the radiographic appearance the chest is very similar to prior studies, as above. Electronically Signed   By: Vinnie Langton M.D.   On: 10/04/2017 07:47   Dg Chest Port 1 View  Result Date: 10/03/2017 CLINICAL DATA:  Respiratory failure. EXAM: PORTABLE CHEST 1 VIEW COMPARISON:  10/02/2017 FINDINGS: Endotracheal tube remains with the tip approximately 2.4 cm above the carina. Gastric decompression tube extends below the diaphragm. Stable cardiac enlargement. Lungs show improved aeration at both bases since the prior chest x-ray with mild atelectasis/infiltrates remaining, left greater than right. No significant pleural effusions. IMPRESSION: Improved aeration of both lower lungs with residual atelectasis/infiltrates remaining, left greater than right. Electronically Signed   By: Aletta Edouard M.D.   On: 10/03/2017 09:16   Dg Chest Port 1 View  Result Date: 10/02/2017 CLINICAL DATA:  Acute encephalopathy. EXAM: PORTABLE CHEST 1 VIEW COMPARISON:  Radiograph of Oct 01, 2017. FINDINGS: Stable cardiomegaly is noted. Endotracheal and nasogastric tubes are unchanged in position. Right internal jugular Port-A-Cath is unchanged in position. No pneumothorax is noted. Increased bibasilar subsegmental atelectasis is noted with associated pleural effusions. Bony thorax is unremarkable. IMPRESSION: Stable support apparatus. Increased bibasilar subsegmental atelectasis is noted with pleural effusions. Electronically Signed   By: Marijo Conception, M.D.    On: 10/02/2017 07:24   Portable Chest X-ray  Result Date: 10/01/2017 CLINICAL DATA:  Endotracheal tube placement EXAM: PORTABLE CHEST 1 VIEW COMPARISON:  09/30/2017 FINDINGS: 1653 hours. Endotracheal tube tip is 2.3 cm above the base of the carina. The NG tube passes into the stomach although the distal tip position is not included on the film. The cardio pericardial silhouette is enlarged. There is pulmonary vascular  congestion without overt pulmonary edema. Left base collapse/consolidation noted. Right Port-A-Cath tip overlies the distal SVC. Bones are diffusely demineralized. Telemetry leads overlie the chest. IMPRESSION: 1. Endotracheal tube tip is 2.3 cm above the base of the carina. 2. Low lung volumes with cardiomegaly and vascular congestion. 3. Left base collapse/consolidation. Electronically Signed   By: Misty Stanley M.D.   On: 10/01/2017 17:36   Dg Chest Port 1 View  Result Date: 09/30/2017 CLINICAL DATA:  Altered mental status. EXAM: PORTABLE CHEST 1 VIEW COMPARISON:  08/13/2017 FINDINGS: Right-sided power port remains in appropriate position. Low lung volumes are again seen. Rounded opacity in the left lung base is again seen which is consistent with a a left diaphragmatic hernia is as shown on previous CT. Cardiomegaly and pulmonary vascular congestion are stable. No evidence of acute infiltrate or pleural effusion. IMPRESSION: No acute findings. Stable cardiomegaly and pulmonary vascular congestion. Stable left diaphragmatic hernia. Electronically Signed   By: Earle Gell M.D.   On: 09/30/2017 18:41   [place] Ekg Site Rite  Result Date: 10/06/2017 If Site Rite image not attached, placement could not be confirmed due to current cardiac rhythm.  [place] Ekg Site Rite  Result Date: 10/01/2017 If Site Rite image not attached, placement could not be confirmed due to current cardiac rhythm.   Lab Data:  CBC: Recent Labs  Lab 09/30/17 1732  10/03/17 0228 10/04/17 0354 10/05/17 0546  10/06/17 0453 10/07/17 0421  WBC 21.4*   < > 33.0* 39.0* 17.4* 13.0* 10.7*  NEUTROABS 19.2  --   --   --   --   --   --   HGB 10.6*   < > 9.1* 10.5* 9.1* 8.5* 8.2*  HCT 32.9*   < > 26.5* 31.6* 28.2* 26.0* 25.5*  MCV 93.7   < > 89.2 89.8 91.3 90.9 91.7  PLT 104*   < > 73* 85* 115* 137* 141*   < > = values in this interval not displayed.   Basic Metabolic Panel: Recent Labs  Lab 10/01/17 0257  10/02/17 1546 10/03/17 0228 10/04/17 0729 10/05/17 0546 10/06/17 0453 10/07/17 0421  NA 134*   < >  --  141 145 148* 143 143  K 4.9   < >  --  3.7 4.2 3.4* 3.1* 3.4*  CL 106   < >  --  111 112* 116* 111 113*  CO2 18*   < >  --  19* 19* 21* 23 23  GLUCOSE 126*   < >  --  207* 236* 130* 125* 144*  BUN 50*   < >  --  56* 70* 57* 40* 30*  CREATININE 3.44*   < >  --  2.18* 2.02* 1.59* 1.47* 1.34*  CALCIUM 7.6*   < >  --  7.3* 7.9* 8.2* 8.2* 8.0*  MG 1.4*  --  2.1 2.2 2.4  --  1.9  --   PHOS 3.3  3.2  --  2.6 2.7  --   --  2.7  --    < > = values in this interval not displayed.   GFR: Estimated Creatinine Clearance: 95.4 mL/min (A) (by C-G formula based on SCr of 1.34 mg/dL (H)). Liver Function Tests: Recent Labs  Lab 09/30/17 1732 10/01/17 0257 10/02/17 0430 10/06/17 0453  AST 94*  --  50* 20  ALT 63  --  42 21  ALKPHOS 64  --  62 81  BILITOT 0.9  --  0.8 0.5  PROT 6.1*  --  5.6* 5.2*  ALBUMIN 2.7* 2.5* 2.3* 2.2*   No results for input(s): LIPASE, AMYLASE in the last 168 hours. Recent Labs  Lab 10/03/17 0228  AMMONIA 23   Coagulation Profile: Recent Labs  Lab 09/30/17 1855 10/01/17 0257 10/03/17 0228  INR 2.86 2.79 1.55   Cardiac Enzymes: Recent Labs  Lab 10/01/17 1219 10/01/17 2114 10/03/17 0918 10/03/17 1525 10/03/17 2226  TROPONINI 0.16* 0.16* 0.17* 0.16* 0.15*   BNP (last 3 results) No results for input(s): PROBNP in the last 8760 hours. HbA1C: No results for input(s): HGBA1C in the last 72 hours. CBG: Recent Labs  Lab 10/06/17 0801 10/06/17 1218  10/06/17 1646 10/06/17 2124 10/07/17 0756  GLUCAP 133* 150* 140* 116* 137*   Lipid Profile: No results for input(s): CHOL, HDL, LDLCALC, TRIG, CHOLHDL, LDLDIRECT in the last 72 hours. Thyroid Function Tests: No results for input(s): TSH, T4TOTAL, FREET4, T3FREE, THYROIDAB in the last 72 hours. Anemia Panel: No results for input(s): VITAMINB12, FOLATE, FERRITIN, TIBC, IRON, RETICCTPCT in the last 72 hours. Urine analysis:    Component Value Date/Time   COLORURINE YELLOW 09/30/2017 1732   APPEARANCEUR TURBID (A) 09/30/2017 1732   LABSPEC 1.017 09/30/2017 1732   PHURINE 7.0 09/30/2017 1732   GLUCOSEU NEGATIVE 09/30/2017 1732   HGBUR LARGE (A) 09/30/2017 1732   BILIRUBINUR NEGATIVE 09/30/2017 1732   KETONESUR NEGATIVE 09/30/2017 1732   PROTEINUR 100 (A) 09/30/2017 1732   UROBILINOGEN 0.2 10/03/2012 1809   NITRITE NEGATIVE 09/30/2017 1732   LEUKOCYTESUR MODERATE (A) 09/30/2017 1732     Malak Duchesneau M.D. [facility] 10/07/2017, 11:32 AM  Pager: [phone] Between 7am to 7pm - call Pager - [phone]  After 7pm go to [email or web address] - password [email or web address]  Call night coverage person covering after 7pm

## 2017-10-07 NOTE — Progress Notes (Signed)
CSW received consult regarding PT recommendation of SNF at discharge.  Patient is refusing SNF. He stated he went to [facility] last year and it was nice but not enough staff. He prefers to be home doing rehab with his wife and home health. Per MD, she is going to see if patient could qualify for inpatient rehab.   CSW signing off. Please re-consult if need arises.    Percell Locus Iana Buzan LCSW [phone]

## 2017-10-08 LAB — GLUCOSE, CAPILLARY
Glucose-Capillary: 155 mg/dL — ABNORMAL HIGH (ref 65–99)
Glucose-Capillary: 127 mg/dL — ABNORMAL HIGH (ref 65–99)
Glucose-Capillary: 125 mg/dL — ABNORMAL HIGH (ref 65–99)
Glucose-Capillary: 88 mg/dL (ref 65–99)

## 2017-10-08 MED ORDER — PREMIER PROTEIN SHAKE
11.0000 [oz_av] | Freq: Two times a day (BID) | ORAL | Status: DC
  Administered 2017-10-08 (×2): 11 [oz_av] via ORAL
  Filled 2017-10-08 (×4): qty 325.31

## 2017-10-08 MED ORDER — HEPARIN SOD (PORK) LOCK FLUSH 100 UNIT/ML IV SOLN
250.0000 [IU] | INTRAVENOUS | Status: AC | PRN
  Administered 2017-10-08: 250 [IU]

## 2017-10-08 MED ORDER — OXYCODONE HCL 5 MG PO TABS: 5.0000 mg | ORAL_TABLET | Freq: Three times a day (TID) | ORAL | 0 refills | Status: AC | PRN

## 2017-10-08 MED ORDER — CHLORPROMAZINE HCL 25 MG PO TABS: 25.0000 mg | ORAL_TABLET | Freq: Four times a day (QID) | ORAL | 0 refills | Status: DC | PRN

## 2017-10-08 MED ORDER — VANCOMYCIN IV (FOR PTA / DISCHARGE USE ONLY): 1500.0000 mg | INTRAVENOUS | 0 refills | Status: AC

## 2017-10-08 MED ORDER — OXYCODONE HCL 5 MG PO TABS: 5.0000 mg | ORAL_TABLET | Freq: Three times a day (TID) | ORAL | 0 refills | Status: DC | PRN

## 2017-10-08 MED ORDER — ONDANSETRON 4 MG PO TBDP: 4.0000 mg | ORAL_TABLET | Freq: Three times a day (TID) | ORAL | 0 refills | Status: DC | PRN

## 2017-10-08 MED ORDER — TORSEMIDE 20 MG PO TABS: 20.0000 mg | ORAL_TABLET | Freq: Two times a day (BID) | ORAL | 3 refills | Status: DC

## 2017-10-08 MED ORDER — CEFAZOLIN IV (FOR PTA / DISCHARGE USE ONLY): 2.0000 g | Freq: Three times a day (TID) | INTRAVENOUS | 0 refills | Status: AC

## 2017-10-08 NOTE — Progress Notes (Signed)
Roda Shutters to be D/C'd Home per MD order.  Discussed with the patient and all questions fully answered.  An After Visit Summary was printed and given to the patient. Patient received prescription.  D/c education completed with patient/family including follow up instructions, medication list, d/c activities limitations if indicated, with other d/c instructions as indicated by MD - patient able to verbalize understanding, all questions fully answered.   Patient is waiting on [facility] to pick him up. Will continue to monitor.  [place] 10/08/2017 5:44 PM

## 2017-10-08 NOTE — Care Management Note (Addendum)
Case Management Note  Patient Details  Name: [name] MRN: [id number] Date of Birth: [birth year]  Subjective/Objective: Pt presented for Septic Shock- Intubated/ Extubated 5/5. PTA from home with support of wife. PT/OT recommendations for SNF. CSW unable to get authorization from [employer]. Previous CM working to get patient set up for Home with [employer]. [facility] to provide IV antibiotics and [facility] RN- IV antibiotics and wound care, PT, OT, Aide and CSW. Pt may need SNF from home. Per pt he has a Hospital Bed in the home and he states that when ambulance picked him up the hospital bed wheel was bent. CM did ask if family had notified [facility]- no call had been made. CM did call Butch Penny with [facility] and she is looking into bed situation and if new bed can be delivered or if technician can go out to repair.                    Action/Plan: CM did try to call the wife @ work- unable to speak with her and phone number left for her to call back. Pt will need ambulance transport home once stable. CM had reached out to pharmacy to see if IV antibiotics could be given earlier 2/2 transition home. CM will await to hear back from [facility]- Unable to send the patient home via ambulance if HB is not repaired.   Expected Discharge Date:  10/08/17               Expected Discharge Plan:  [place]  In-House Referral:  Clinical Social Work  Discharge planning Services  CM Consult  Post Acute Care Choice:  Home Health Choice offered to:  Patient  DME Arranged:  IV pump/equipment(home IV ABX) DME Agency:  [place] Arranged:  RN,PT,Social Worker, IV Antibiotics HH Agency:  [facility]  Status of Service:  Completed, signed off  If discussed at [place] of Stay Meetings, dates discussed:    Additional Comments: 1617 10-08-17 Jacqlyn Krauss, RN,BSN [phone] CM did get call from patients brother and he states that Technician from [facility]  had to get a part and will be completed with bed repair by 6:00 pm. CM did call [facility] and scheduled transportation at 5:50. Per [facility]- ambulance will dispatch around 5:15 and get to hospital by 5:30 and should be ready to leave hospital by 5:50. Pt's brother to bring clothes for transport. [facility] Staff RN will be att he home by 7:00. No further needs from CM @ this time.    1422 10-08-17 Jacqlyn Krauss, RN,BSN [phone] CM was able to speak with the patients wife and she is aware that plan will be for transition home today via ambulance. CM awaiting call from [facility] to make sure technician can repair the bed. Once repaired then will call [facility] for ambulance transport home. As of now no delays in transportation. CM just received call from wife and technician will be at the home at 3:15 to repair. Brother to receive the patient. [place] RN to visit the home at 7:00 pm. No further needs from CM at this time.     1244 10-08-17 Jacqlyn Krauss, RN,BSN [phone] CM did speak with [facility] and Pt can not be serviced. Plan will be to utilize [facility] for [facility] RN, PT, SW services. Wife is agreeable to services. Awaiting on technician to repair bed before [facility] is called. [place] to visit patient  this afternoon. Medical Necessity Form to be placed on the Shadow Chart. No further needs from CM at this time.   1251 10-08-17 Jacqlyn Krauss, RN, BSN [phone] CM did speak with [facility] Liaison in regards to disposition needs: Per liaison Certification for [place] is up with agency. CM did call [facility] to see if the Certification could be reinstated- awaiting call back. CM was able to speak with the patients wife in regards to DME- wife to call [place] to make sure that bed is repaired. Wife stated that brother could be in the home to receive the patient if he is transitioned today. Wife has to take the disabled daughter to an appointment. CM awaiting call back from [facility] [place] [facility].   Bethena Roys, RN 10/08/2017, 11:19 AM

## 2017-10-08 NOTE — Progress Notes (Signed)
RN will call when patient is ready to be placed on CPAP

## 2017-10-08 NOTE — Progress Notes (Signed)
Physical Therapy Treatment Patient Details Name: [name] MRN: [id number] DOB: [birth year] Today's Date: 10/08/2017    History of Present Illness 49yoM with hx OSA, Morbid obesity, CAD, HTN, Gout, GERD, Dm, Multiple myeloma, CKD, Aflutter, CHF (EF 35%), Anemia, who had a recent ureteral stent placed and now presents with AMS and Septic shock due to UTI. Port removed; Recovery comlicated by seizures; Extubated 5/6    PT Comments    Pt reports he is overwhelmed by the idea of going  home today; he is aware that he will need non-emergency ambulance transport home; He  Is progressing slowly toward PT goals; cooperative and motivated today, open to discussing the importance of wt loss and exercise; will continue to follow in acute setting  Follow Up Recommendations  Home health PT;Supervision/Assistance - 24 hour     Equipment Recommendations  None recommended by PT    Recommendations for Other Services       Precautions / Restrictions Precautions Precautions: Fall Precaution Comments: Very sore scrotum Restrictions Weight Bearing Restrictions: No    Mobility  Bed Mobility Overal bed mobility: Needs Assistance Bed Mobility: Supine to Sit;Sit to Supine     Supine to sit: Min assist;+2 for safety/equipment Sit to supine: Mod assist;+2 for safety/equipment   General bed mobility comments: pt able to transition to EOB with overall MinA, verbal cues for techique, advancing LEs over EOB and use of handrails to bring trunk upright, requires increased time and effort; assist for LEs when returning to supine with verbal cues for technique   Transfers Overall transfer level: Needs assistance Equipment used: Rolling walker (2 wheeled) Transfers: Sit to/from Stand Sit to Stand: Min assist;+2 physical assistance;+2 safety/equipment         General transfer comment: MinA+2 to rise from elevated bed height, pt with slow movements requiring increased time/effort to reach full  standing as well as increased time to mentally prepare prior task completion; completed sit<>stand x2 from EOB this session, maintaining standing during first trial with minA-minguard for static standing; pt requires increased time for seated rest break prior to second trial  Ambulation/Gait Ambulation/Gait assistance: Min assist;+2 safety/equipment;+2 physical assistance   Assistive device: Rolling walker (2 wheeled)       General Gait Details: lateral steps along EOB; pt felt getting to chair would be too painful   Stairs             Wheelchair Mobility    Modified Rankin (Stroke Patients Only)       Balance Overall balance assessment: Needs assistance Sitting-balance support: Feet supported;Bilateral upper extremity supported Sitting balance-Leahy Scale: Fair       Standing balance-Leahy Scale: Poor Standing balance comment: reliant on UE support                             Cognition Arousal/Alertness: Awake/alert Behavior During Therapy: WFL for tasks assessed/performed Overall Cognitive Status: Within Functional Limits for tasks assessed                                        Exercises      General Comments        Pertinent Vitals/Pain Pain Assessment: Faces Faces Pain Scale: Hurts even more Pain Location: scrotal pain; bil knees  Pain Descriptors / Indicators: Aching;Grimacing;Guarding Pain Intervention(s): Monitored during session;Repositioned    Home Living  Prior Function            PT Goals (current goals can now be found in the care plan section) Acute Rehab PT Goals Patient Stated Goal: wanted to go to rehab but going home PT Goal Formulation: With patient Time For Goal Achievement: 10/19/17 Potential to Achieve Goals: Good Progress towards PT goals: Progressing toward goals    Frequency    Min 3X/week      PT Plan Discharge plan needs to be updated;Frequency needs to  be updated    Co-evaluation PT/OT/SLP Co-Evaluation/Treatment: Yes Reason for Co-Treatment: For patient/therapist safety PT goals addressed during session: Mobility/safety with mobility;Proper use of DME OT goals addressed during session: Proper use of Adaptive equipment and DME;Strengthening/ROM;ADL's and self-care      AM-PAC PT "6 Clicks" Daily Activity  Outcome Measure  Difficulty turning over in bed (including adjusting bedclothes, sheets and blankets)?: Unable Difficulty moving from lying on back to sitting on the side of the bed? : Unable Difficulty sitting down on and standing up from a chair with arms (e.g., wheelchair, bedside commode, etc,.)?: Unable Help needed moving to and from a bed to chair (including a wheelchair)?: A Lot Help needed walking in hospital room?: A Lot Help needed climbing 3-5 steps with a railing? : A Lot 6 Click Score: 9    End of Session   Activity Tolerance: Patient tolerated treatment well Patient left: in bed;with call bell/phone within reach   PT Visit Diagnosis: Other abnormalities of gait and mobility (R26.89);Muscle weakness (generalized) (M62.81)     Time: 7972-8206 PT Time Calculation (min) (ACUTE ONLY): 44 min  Charges:  $Therapeutic Activity: 23-37 mins                    G Codes:          Mithcell Schumpert 2017/10/18, 2:14 PM

## 2017-10-08 NOTE — Progress Notes (Signed)
Occupational Therapy Treatment Patient Details Name: [name] MRN: [id number] DOB: [birth year] Today's Date: 10/08/2017    History of present illness 50yoM with hx OSA, Morbid obesity, CAD, HTN, Gout, GERD, Dm, Multiple myeloma, CKD, Aflutter, CHF (EF 35%), Anemia, who had a recent ureteral stent placed and now presents with AMS and Septic shock due to UTI. Port removed; Recovery comlicated by seizures; Extubated 5/6   OT comments  Pt progressing towards OT goals, and is very motivated to work with therapy to return to PLOF. Pt completing bed mobility with Min-ModA this session, completing 2 sets of sit<>stand from EOB and side steps along EOB using RW with MinA+2. Pt continues to require MaxA for LB ADLs, MinGuard-setup assist for UB ADLs. Feel pt remains appropriate for SNF level services at this time, however pt reports he may be returning home (vs SNF). If pt is to return home, recommend pt receive HHOT services to maximize pt's safety and independence with ADLs and mobility. Will continue to follow acutely to progress pt towards established OT goals.    Follow Up Recommendations  SNF;Supervision/Assistance - 24 hour;Other (comment)(if pt returning home, recommend HHOT)    Equipment Recommendations  None recommended by OT          Precautions / Restrictions Precautions Precautions: Fall Precaution Comments: Very sore scrotum Restrictions Weight Bearing Restrictions: No       Mobility Bed Mobility Overal bed mobility: Needs Assistance Bed Mobility: Supine to Sit;Sit to Supine     Supine to sit: Min assist;+2 for safety/equipment Sit to supine: Mod assist;+2 for safety/equipment   General bed mobility comments: pt able to transition to EOB with overall MinA, verbal cues for techique, advancing LEs over EOB and use of handrails to bring trunk upright, requires increased time and effort; assist for LEs when returning to supine with verbal cues for technique    Transfers Overall transfer level: Needs assistance Equipment used: Rolling walker (2 wheeled) Transfers: Sit to/from Stand Sit to Stand: Min assist;+2 physical assistance;+2 safety/equipment         General transfer comment: MinA+2 to rise from elevated bed height, pt with slow movements requiring increased time/effort to reach full standing as well as increased time to mentally prepare prior task completion; completed sit<>stand x2 from EOB this session, maintaining standing during first trial with minA-minguard for static standing; pt requires increased time for seated rest break prior to second trial    Balance Overall balance assessment: Needs assistance Sitting-balance support: Feet supported;Bilateral upper extremity supported Sitting balance-Leahy Scale: Fair       Standing balance-Leahy Scale: Poor Standing balance comment: reliant on UE support                            ADL either performed or assessed with clinical judgement   ADL Overall ADL's : Needs assistance/impaired     Grooming: Set up;Sitting                               Functional mobility during ADLs: Minimal assistance;+2 for physical assistance;+2 for safety/equipment;Rolling walker General ADL Comments: pt very motivated to participate with therapy; completed bed mobility sit<>stand x2 from EOB and able to take small side steps along EOB with MinA+2 using RW; pt requires increased time for rest breaks due to fatigue; educated on safety during ADLs and importance of movement as pt reports he will most  likley return home vs SNF                        Cognition Arousal/Alertness: Awake/alert Behavior During Therapy: WFL for tasks assessed/performed Overall Cognitive Status: Within Functional Limits for tasks assessed                                                            Pertinent Vitals/ Pain       Pain Assessment: Faces Faces Pain  Scale: Hurts even more Pain Location: scrotal pain; bil knees  Pain Descriptors / Indicators: Aching;Grimacing;Guarding Pain Intervention(s): Monitored during session;Repositioned                                                          Frequency  Min 2X/week        Progress Toward Goals  OT Goals(current goals can now be found in the care plan section)  Progress towards OT goals: Progressing toward goals  Acute Rehab OT Goals Patient Stated Goal: Would rather go home than to a SNF OT Goal Formulation: With patient Time For Goal Achievement: 10/20/17 Potential to Achieve Goals: Good  Plan Discharge plan remains appropriate    Co-evaluation    PT/OT/SLP Co-Evaluation/Treatment: Yes Reason for Co-Treatment: For patient/therapist safety;To address functional/ADL transfers   OT goals addressed during session: Proper use of Adaptive equipment and DME;Strengthening/ROM;ADL's and self-care      AM-PAC PT "6 Clicks" Daily Activity     Outcome Measure   Help from another person eating meals?: None Help from another person taking care of personal grooming?: A Little Help from another person toileting, which includes using toliet, bedpan, or urinal?: A Lot Help from another person bathing (including washing, rinsing, drying)?: A Little Help from another person to put on and taking off regular upper body clothing?: None Help from another person to put on and taking off regular lower body clothing?: A Lot 6 Click Score: 18    End of Session Equipment Utilized During Treatment: Rolling walker  OT Visit Diagnosis: Unsteadiness on feet (R26.81);Muscle weakness (generalized) (M62.81);Pain Pain - part of body: (scrotal pain )   Activity Tolerance Patient tolerated treatment well   Patient Left in bed;with call bell/phone within reach   Nurse Communication Mobility status        Time: 8270-7867 OT Time Calculation (min): 45 min  Charges: OT  General Charges $OT Visit: 1 Visit OT Treatments $Therapeutic Activity: 8-22 mins  Lou Cal, OT Pager [phone] 10/08/2017    [name] 10/08/2017, 11:10 AM

## 2017-10-08 NOTE — Discharge Summary (Signed)
Physician Discharge Summary   Patient ID: [name] MRN: [id number] DOB/AGE: 59-Jun-1962 59 y.o.  Admit date: 09/30/2017 Discharge date: 10/08/2017  Primary Care Physician:  [name], MD   Recommendations for Outpatient Follow-up:  1. Follow up with PCP in 1-2 weeks 2. Please obtain BMP/CBC in one week 3. Patient will follow-up with his urologist, Dr. Gilford Rile within the next 7 to 10 days, will continue Foley until follow-up  Home Health: PT OT RN, home health aide Equipment/Devices: None  Discharge Condition: stable  CODE STATUS: FULL  Diet recommendation: Heart healthy diet   Discharge Diagnoses:    . Septic shock ([place]) . Urinary tract infection with hematuria . Acute metabolic encephalopathy . MRSA bacteremia . E. coli bacteremia Generalized debility Recent Fournier's gangrene  Consults: PCCM Infectious disease Neurology CIR    Allergies:   Allergies  Allergen Reactions  . Beef-Derived Products Diarrhea  . Pork-Derived Products Diarrhea     DISCHARGE MEDICATIONS: Allergies as of 10/08/2017      Reactions   Beef-derived Products Diarrhea   Pork-derived Products Diarrhea      Medication List    STOP taking these medications   sulfamethoxazole-trimethoprim 800-160 MG tablet Commonly known as:  BACTRIM DS,SEPTRA DS   traMADol 50 MG tablet Commonly known as:  ULTRAM     TAKE these medications   acetaminophen 500 MG tablet Commonly known as:  TYLENOL Take 500-1,000 mg by mouth every 6 (six) hours as needed for mild pain or moderate pain.   acyclovir 400 MG tablet Commonly known as:  ZOVIRAX Take 1 tablet (400 mg total) by mouth every morning.   allopurinol 300 MG tablet Commonly known as:  ZYLOPRIM TAKE ONE TABLET BY MOUTH ONCE DAILY.   aspirin EC 81 MG tablet Take 81 mg by mouth daily.   atorvastatin 80 MG tablet Commonly known as:  LIPITOR TAKE ONE TABLET BY MOUTH AT BEDTIME.   calcitRIOL 0.25 MCG capsule Commonly known as:   ROCALTROL Take 0.25 mcg by mouth 3 (three) times a week. Monday, Wednesday, Friday.   CALCIUM 600 PO Take 1 tablet by mouth 2 (two) times daily.   ceFAZolin IVPB Commonly known as:  ANCEF Inject 2 g into the vein every 8 (eight) hours for 26 days. Indication:  Ecoli bacteremia/UTI Last Day of Therapy:  11/02/2017 Labs - Once weekly:  CBC/D and BMP, Labs - Every other week:  ESR and CRP   chlorproMAZINE 25 MG tablet Commonly known as:  THORAZINE Take 1 tablet (25 mg total) by mouth 4 (four) times daily as needed for hiccoughs.   DEXILANT 60 MG capsule Generic drug:  dexlansoprazole Take 60 mg by mouth every morning.   dicyclomine 20 MG tablet Commonly known as:  BENTYL TAKE 1 TABLET BY MOUTH BEFORE MEALS 3 TIMES DAILY.   DRY EYES OP Place 1 drop into both eyes as needed (dry eyes).   gabapentin 300 MG capsule Commonly known as:  NEURONTIN Take 1 capsule (300 mg total) by mouth 2 (two) times daily.   insulin detemir 100 UNIT/ML injection Commonly known as:  LEVEMIR Inject 0.1 mLs (10 Units total) into the skin every morning.   insulin lispro 100 UNIT/ML injection Commonly known as:  HUMALOG Inject 0-0.1 mLs (0-10 Units total) into the skin 2 (two) times daily with a meal. Sliding Scale per patient  CBG 150-200: 1 units CBG 201-275:  3 units CBG 275-350: 5 units 351-425: 7 units 425-500: 9 units 500+: 10 units   loratadine  10 MG tablet Commonly known as:  CLARITIN Take 10 mg by mouth every morning.   methocarbamol 500 MG tablet Commonly known as:  ROBAXIN Take 1 tablet (500 mg total) by mouth every 8 (eight) hours as needed for muscle spasms.   multivitamins ther. w/minerals Tabs tablet Take 1 tablet by mouth daily.   nitroGLYCERIN 0.4 MG SL tablet Commonly known as:  NITROSTAT DISSOLVE 1 TABLET UNDER TONGUE EVERY 5 MINUTES UP TO 15 MIN FOR CHESTPAIN. IF NO RELIEF CALL 911.   ondansetron 4 MG disintegrating tablet Commonly known as:  ZOFRAN ODT Take 1 tablet  (4 mg total) by mouth every 8 (eight) hours as needed for nausea or vomiting.   oxyCODONE 5 MG immediate release tablet Commonly known as:  Oxy IR/ROXICODONE Take 1-2 tablets (5-10 mg total) by mouth every 8 (eight) hours as needed for up to 5 days for moderate pain or severe pain.   potassium chloride SA 20 MEQ tablet Commonly known as:  K-DUR,KLOR-CON Take 1 tablet (20 mEq total) by mouth daily.   SLOW-MAG PO Take 1 tablet by mouth every morning.   torsemide 20 MG tablet Commonly known as:  DEMADEX Take 1 tablet (20 mg total) by mouth 2 (two) times daily. Take extra 1 tab for weight gain of 3 lbs in 1 day or 5 lbs in 2 days What changed:  when to take this   vancomycin IVPB Inject 1,500 mg into the vein daily for 26 days. Indication:  MRSA bacteremia Last Day of Therapy:  11/02/2017 Labs - 'Sunday/Monday:  CBC/D, BMP, and vancomycin trough. Labs - Thursday:  BMP and vancomycin trough Labs - Every other week:  ESR and CRP   VIBERZI 100 MG Tabs Generic drug:  Eluxadoline Take 100 mg 2 (two) times daily with a meal by mouth.   Vitamin D (Ergocalciferol) 50000 units Caps capsule Commonly known as:  DRISDOL TAKE 1 CAPSULE BY MOUTH EVERY THIRTY DAYS.   XARELTO 20 MG Tabs tablet Generic drug:  rivaroxaban TAKE 1 TABLET BY MOUTH DAILY.            Home Infusion Instuctions  (From admission, onward)        Start     Ordered   10/08/17 0000  Home infusion instructions [facility] May follow [facility] Dosing Protocol; May administer Cathflo as needed to maintain patency of vascular access device.; Flushing of vascular access device: per [facility] Protocol: 0.9% NaCl pre/post medica...    Question Answer Comment  Instructions May follow [facility] Dosing Protocol   Instructions May administer Cathflo as needed to maintain patency of vascular access device.   Instructions Flushing of vascular access device: per [facility] Protocol: 0.9% NaCl pre/post medication administration and  prn patency; Heparin 100 u/ml, 5ml for implanted ports and Heparin 10u/ml, 5ml for all other central venous catheters.   Instructions May follow [facility] Anaphylaxis Protocol for First Dose Administration in the home: 0.9% NaCl at 25-50 ml/hr to maintain IV access for protocol meds. Epinephrine 0.3 ml IV/IM PRN and Benadryl 25-50 IV/IM PRN s/s of anaphylaxis.   Instructions [facility] Infusion Coordinator (RN) to assist per patient IV care needs in the home PRN.      05' /09/19 1020       Brief H and P: For complete details please refer to admission H and P, but in brief29 year old male with hx of morbid obesity, multiple myeloma on chronic steroids, systolic CHF, HTN, GERD, aflutter on Xarelto, CKD  stage 3, anemia, DM, and chronic foley who presented to [facility] ER with altered mental status. He required tx for Fournier's gangrene 04/2017. Returned home from rehab in December. Klebsiella bacteremia from obstructing ureteral stone with ureteral stent placement 3/1. Home health exchanged foley on 4/30 and afterwards his wife noted altered mental status, decreased PO intake, and fever.   On arrival to ER he was febrile, minimally responsive, and hypotensive. Labs noted for WBC 21.4, Cr 3.48 (up from 1.54 on 3/15), lactic 3.77.    Hospital Course:   Septic shock ([place]) with E. coli bacteremia, MRSA bacteremia - At the time of presentation,-patient had septic shock with minimally responsive, hypotensive, febrile, leukocytosis, acute kidney injury, lactic acidosis -Patient was intubated on 5/2, transferred to ICU, was placed on low-dose pressors. - Suspected right chest port infection, hence was removed.  Patient was extubated on 5/5.  -Currently improving, ID following, recommended vancomycin and Ancef for 4 weeks, stop date 11/02/2017 -PICC line was placed.  Patient refused skilled nursing facility, was declined by inpatient rehab.  Home health PT, OT, RN, home health aide arranged by the  case management.      Fournier's gangrene of scrotum -Currently improving, wound on the scrotum, continue outpatient wound care.    Sebaceous cyst on the back -On exam, not draining or bleeding, patient reports that it is bothering him and is larger now.    General surgery consult was requested.  Per surgery, follow-up outpatient, does not need any urgent surgical interventions.      AKI (acute kidney injury) ([place]) -Creatinine 3.4 at the time of admission due to #1 -Currently improving, baseline 1.4.  1.3 at the time of discharge  Acute metabolic encephalopathy -Likely due to septic shock, bacteremia, resolved, alert and oriented.    Acute respiratory failure with hypoxia ([facility]) Improved, patient was intubated and extubated due to septic shock, currently O2 sats 100% on room air  History of multiple myeloma -Previously on maintenance with lenalidomide and dexamethasone, currently off due to recurrent severe infections, outpatient follow-up with oncology  Chronic systolic CHF -EF 35 to 67% per echo October 2017, currently stable, compensated  Chronic Foley cath, right ureteral stent, placed to 07/31/2017 -Outpatient follow-up with urology.   Polymyoclonus - Resolved off meropenem and ciprofloxacin, EEG unremarkable  Morbid obesity BMI 44.5, counseled on diet and weight control     Day of Discharge S: Feels he is strong enough to go home today.  BP (!) 96/43 (BP Location: Left Arm)   Pulse 61   Temp 98.2 F (36.8 C) (Oral)   Resp 16   Ht '6\' 2"'  (1.88 m)   Wt (!) 190.5 kg (420 lb)   SpO2 100%   BMI 53.92 kg/m   Physical Exam: General: Alert and awake oriented x3 not in any acute distress. HEENT: anicteric sclera, pupils reactive to light and accommodation CVS: S1-S2 clear no murmur rubs or gallops Chest: clear to auscultation bilaterally, no wheezing rales or rhonchi Abdomen: Obese, soft nontender, nondistended, normal bowel sounds Extremities: no cyanosis,  clubbing or edema noted bilaterally    The results of significant diagnostics from this hospitalization (including imaging, microbiology, ancillary and laboratory) are listed below for reference.      Procedures/Studies:  Ct Abdomen Pelvis Wo Contrast  Result Date: 09/30/2017 CLINICAL DATA:  57 year old male presents with hematuria, sepsis and fever. Hypotension. History of obstructing ureteral stones. EXAM: CT ABDOMEN AND PELVIS WITHOUT CONTRAST TECHNIQUE: Multidetector CT imaging of the abdomen and pelvis  was performed following the standard protocol without IV contrast. COMPARISON:  07/30/2017 FINDINGS: Lower chest: New bibasilar pulmonary consolidations likely reflecting atelectasis, left greater than right. Superimposed pneumonia would be difficult to entirely exclude given history of fever and sepsis. Heart size is top normal in size without pericardial effusion. Hepatobiliary: 6 mm dependent gallstone within a non thickened but distended gallbladder. This could be due to a fasting state. The unenhanced liver is unremarkable. Pancreas: No ductal dilatation or mass given limitations of a noncontrast study. Spleen: Normal size spleen. Adrenals/Urinary Tract: Normal bilateral adrenal glands. Right-sided double pigtail ureteral stent is identified with the proximal coil in the right renal pelvis and distal coil in the decompressed urinary bladder. No calculus identified along the course of the ureteral stent. A Foley catheter decompresses the urinary bladder. Left kidney is unremarkable apart from a punctate nonobstructing left renal calculus. Moderate perinephric fat stranding is noted. Stomach/Bowel: Small hiatal hernia. Decompressed stomach with normal small bowel rotation. Normal appendix. Minimal scattered colonic diverticulosis without acute diverticulitis along the left colon. Vascular/Lymphatic: No aortic aneurysm. Aortoiliac and branch vessel atherosclerosis. No aneurysm or adenopathy.  Reproductive: Normal size prostate and seminal vesicles. Other: No free air. Small fat containing umbilical hernia. Small fat containing right inguinal hernia. Musculoskeletal: Thoracolumbar spondylosis with multilevel degenerative disc disease most marked from L2 through S1 in the lumbar spine. Associated facet arthropathy is seen. IMPRESSION: 1. Uncomplicated cholelithiasis. 2. Nonobstructing left lower pole renal calculus. No hydroureteronephrosis. Stable right double pigtail ureteral stent in place with decompressed bladder containing a Foley catheter. 3. Patchy airspace opacities at each lung base, left greater than right compatible with atelectasis. Minimal superimposed pneumonia is not excluded given history of fever and sepsis. 4. Thoracolumbar spondylosis. Electronically Signed   By: Ashley Royalty M.D.   On: 09/30/2017 20:40   Dg Chest Port 1 View  Result Date: 10/05/2017 CLINICAL DATA:  Acute respiratory failure EXAM: PORTABLE CHEST 1 VIEW COMPARISON:  10/04/2017 FINDINGS: Cardiac shadow is stable. Elevation of left hemidiaphragm is again noted. Increasing vascular congestion and right basilar infiltrate is seen. Endotracheal tube and nasogastric catheter have been removed in the interval. IMPRESSION: Increasing right basilar infiltrate. Changes consistent with congestive failure. Electronically Signed   By: Inez Catalina M.D.   On: 10/05/2017 08:00   Dg Chest Port 1 View  Result Date: 10/04/2017 CLINICAL DATA:  57 year old male with history of acute respiratory failure with hypoxia. EXAM: PORTABLE CHEST 1 VIEW COMPARISON:  Chest x-ray 10/03/2017. FINDINGS: An endotracheal tube is in place with tip 5.1 cm above the carina. A nasogastric tube is seen extending into the stomach, however, the tip of the nasogastric tube extends below the lower margin of the image. Severe eventration of the lateral left hemidiaphragm, similar to prior studies. Low lung volumes. Bibasilar opacities (left greater than right)  may reflect areas of atelectasis and/or airspace consolidation. No evidence of pulmonary edema. No pneumothorax. Heart size appears mildly enlarged. Upper mediastinal contours are within normal limits. IMPRESSION: 1. Support apparatus, as above. 2. Otherwise, the radiographic appearance the chest is very similar to prior studies, as above. Electronically Signed   By: Vinnie Langton M.D.   On: 10/04/2017 07:47   Dg Chest Port 1 View  Result Date: 10/03/2017 CLINICAL DATA:  Respiratory failure. EXAM: PORTABLE CHEST 1 VIEW COMPARISON:  10/02/2017 FINDINGS: Endotracheal tube remains with the tip approximately 2.4 cm above the carina. Gastric decompression tube extends below the diaphragm. Stable cardiac enlargement. Lungs show improved aeration at both  bases since the prior chest x-ray with mild atelectasis/infiltrates remaining, left greater than right. No significant pleural effusions. IMPRESSION: Improved aeration of both lower lungs with residual atelectasis/infiltrates remaining, left greater than right. Electronically Signed   By: Aletta Edouard M.D.   On: 10/03/2017 09:16   Dg Chest Port 1 View  Result Date: 10/02/2017 CLINICAL DATA:  Acute encephalopathy. EXAM: PORTABLE CHEST 1 VIEW COMPARISON:  Radiograph of Oct 01, 2017. FINDINGS: Stable cardiomegaly is noted. Endotracheal and nasogastric tubes are unchanged in position. Right internal jugular Port-A-Cath is unchanged in position. No pneumothorax is noted. Increased bibasilar subsegmental atelectasis is noted with associated pleural effusions. Bony thorax is unremarkable. IMPRESSION: Stable support apparatus. Increased bibasilar subsegmental atelectasis is noted with pleural effusions. Electronically Signed   By: Marijo Conception, M.D.   On: 10/02/2017 07:24   Portable Chest X-ray  Result Date: 10/01/2017 CLINICAL DATA:  Endotracheal tube placement EXAM: PORTABLE CHEST 1 VIEW COMPARISON:  09/30/2017 FINDINGS: 1653 hours. Endotracheal tube tip is 2.3  cm above the base of the carina. The NG tube passes into the stomach although the distal tip position is not included on the film. The cardio pericardial silhouette is enlarged. There is pulmonary vascular congestion without overt pulmonary edema. Left base collapse/consolidation noted. Right Port-A-Cath tip overlies the distal SVC. Bones are diffusely demineralized. Telemetry leads overlie the chest. IMPRESSION: 1. Endotracheal tube tip is 2.3 cm above the base of the carina. 2. Low lung volumes with cardiomegaly and vascular congestion. 3. Left base collapse/consolidation. Electronically Signed   By: Misty Stanley M.D.   On: 10/01/2017 17:36   Dg Chest Port 1 View  Result Date: 09/30/2017 CLINICAL DATA:  Altered mental status. EXAM: PORTABLE CHEST 1 VIEW COMPARISON:  08/13/2017 FINDINGS: Right-sided power port remains in appropriate position. Low lung volumes are again seen. Rounded opacity in the left lung base is again seen which is consistent with a a left diaphragmatic hernia is as shown on previous CT. Cardiomegaly and pulmonary vascular congestion are stable. No evidence of acute infiltrate or pleural effusion. IMPRESSION: No acute findings. Stable cardiomegaly and pulmonary vascular congestion. Stable left diaphragmatic hernia. Electronically Signed   By: Earle Gell M.D.   On: 09/30/2017 18:41   [place] Ekg Site Rite  Result Date: 10/06/2017 If Site Rite image not attached, placement could not be confirmed due to current cardiac rhythm.  [place] Ekg Site Rite  Result Date: 10/01/2017 If Site Rite image not attached, placement could not be confirmed due to current cardiac rhythm.      LAB RESULTS: Basic Metabolic Panel: Recent Labs  Lab 10/06/17 0453 10/07/17 0421  NA 143 143  K 3.1* 3.4*  CL 111 113*  CO2 23 23  GLUCOSE 125* 144*  BUN 40* 30*  CREATININE 1.47* 1.34*  CALCIUM 8.2* 8.0*  MG 1.9  --   PHOS 2.7  --    Liver Function Tests: Recent Labs  Lab 10/02/17 0430 10/06/17 0453   AST 50* 20  ALT 42 21  ALKPHOS 62 81  BILITOT 0.8 0.5  PROT 5.6* 5.2*  ALBUMIN 2.3* 2.2*   No results for input(s): LIPASE, AMYLASE in the last 168 hours. Recent Labs  Lab 10/03/17 0228  AMMONIA 23   CBC: Recent Labs  Lab 10/06/17 0453 10/07/17 0421  WBC 13.0* 10.7*  HGB 8.5* 8.2*  HCT 26.0* 25.5*  MCV 90.9 91.7  PLT 137* 141*   Cardiac Enzymes: Recent Labs  Lab 10/03/17 1525 10/03/17 2226  TROPONINI  0.16* 0.15*   BNP: Invalid input(s): POCBNP CBG: Recent Labs  Lab 10/08/17 0751 10/08/17 1239  GLUCAP 127* 125*      Disposition and Follow-up: Discharge Instructions    Home infusion instructions [facility] May follow [place] Dosing Protocol; May administer Cathflo as needed to maintain patency of vascular access device.; Flushing of vascular access device: per [facility] Protocol: 0.9% NaCl pre/post medica...   Complete by:  As directed    Instructions:  May follow [place] Dosing Protocol   Instructions:  May administer Cathflo as needed to maintain patency of vascular access device.   Instructions:  Flushing of vascular access device: per [facility] Protocol: 0.9% NaCl pre/post medication administration and prn patency; Heparin 100 u/ml, 76m for implanted ports and Heparin 10u/ml, 560mfor all other central venous catheters.   Instructions:  May follow [facility] Anaphylaxis Protocol for First Dose Administration in the home: 0.9% NaCl at 25-50 ml/hr to maintain IV access for protocol meds. Epinephrine 0.3 ml IV/IM PRN and Benadryl 25-50 IV/IM PRN s/s of anaphylaxis.   Instructions:  [place] Coordinator (RN) to assist per patient IV care needs in the home PRN.   Increase activity slowly   Complete by:  As directed        DISPOSITION: Home health PT, OT, RN, home health aide   DIRincon  JeAviva SignsMD. Call.   Specialty:  General Surgery Why:  to arrange follow up regarding sebaceous cyst Contact  information: [phone]        [facility] - Dme Follow up.   Why:  home IV ABX arranged Contact information: [phone]      HiIona BeardMD. Schedule an appointment as soon as possible for a visit in 2 week(s).   Specialty:  Family Medicine Contact information: [address] [place] [phone]        KoHerminio CommonsMD .   Specialty:  Cardiology Contact information: [phone]      Health, [facility]-Home Follow up.   Specialty:  Home Health Services Why:  Registered Nurse- Wound Care and IV antibiotics, Physical Therapy, SoEducation officer, museum Contact information: [id number] [phone]          Time coordinating discharge:  3558m  Signed:   RipEstill CottaD. [facility] 10/08/2017, 1:52 PM Pager: [phone]

## 2017-10-15 ENCOUNTER — Other Ambulatory Visit: Payer: Self-pay | Admitting: Pharmacist

## 2017-10-15 ENCOUNTER — Other Ambulatory Visit (HOSPITAL_COMMUNITY)
Admission: RE | Admit: 2017-10-15 | Discharge: 2017-10-15 | Disposition: A | Discharge status: Home / Self Care | Payer: BLUE CROSS/BLUE SHIELD | Source: Other Acute Inpatient Hospital | Attending: Family Medicine | Admitting: Family Medicine

## 2017-10-15 DIAGNOSIS — Z5181 Encounter for therapeutic drug level monitoring: Secondary | ICD-10-CM | POA: Insufficient documentation

## 2017-10-15 LAB — BASIC METABOLIC PANEL
Anion gap: 10 (ref 5–15)
BUN: 27 mg/dL — ABNORMAL HIGH (ref 6–20)
CO2: 27 mmol/L (ref 22–32)
Calcium: 8.7 mg/dL — ABNORMAL LOW (ref 8.9–10.3)
Chloride: 101 mmol/L (ref 101–111)
Creatinine, Ser: 1.51 mg/dL — ABNORMAL HIGH (ref 0.61–1.24)
GFR calc Af Amer: 57 mL/min — ABNORMAL LOW (ref >60)
GFR calc non Af Amer: 49 mL/min — ABNORMAL LOW (ref >60)
Glucose, Bld: 112 mg/dL — ABNORMAL HIGH (ref 65–99)
Potassium: 4.4 mmol/L (ref 3.5–5.1)
Sodium: 138 mmol/L (ref 135–145)

## 2017-10-15 LAB — VANCOMYCIN, TROUGH: Vancomycin Tr: 15 ug/mL (ref 15–20)

## 2017-10-15 NOTE — Progress Notes (Signed)
[facility] pharmacy lab review  

## 2017-10-20 ENCOUNTER — Encounter: Payer: Self-pay | Admitting: Infectious Diseases

## 2017-10-23 ENCOUNTER — Other Ambulatory Visit: Payer: Self-pay | Admitting: Pharmacist

## 2017-10-23 NOTE — Progress Notes (Signed)
[facility] pharmacy lab review  

## 2017-10-26 ENCOUNTER — Other Ambulatory Visit (HOSPITAL_COMMUNITY)
Admission: AD | Admit: 2017-10-26 | Discharge: 2017-10-26 | Disposition: A | Discharge status: Home / Self Care | Payer: BLUE CROSS/BLUE SHIELD | Source: Other Acute Inpatient Hospital | Attending: Family Medicine | Admitting: Family Medicine

## 2017-10-26 DIAGNOSIS — Z5181 Encounter for therapeutic drug level monitoring: Secondary | ICD-10-CM | POA: Insufficient documentation

## 2017-10-26 LAB — CBC WITH DIFFERENTIAL/PLATELET
Basophils Absolute: 0 10*3/uL (ref 0.0–0.1)
Basophils Relative: 1 %
Eosinophils Absolute: 0.4 10*3/uL (ref 0.0–0.7)
Eosinophils Relative: 7 %
HCT: 30 % — ABNORMAL LOW (ref 39.0–52.0)
Hemoglobin: 9.1 g/dL — ABNORMAL LOW (ref 13.0–17.0)
Lymphocytes Relative: 42 %
Lymphs Abs: 2.4 10*3/uL (ref 0.7–4.0)
MCH: 29.1 pg (ref 26.0–34.0)
MCHC: 30.3 g/dL (ref 30.0–36.0)
MCV: 95.8 fL (ref 78.0–100.0)
Monocytes Absolute: 0.8 10*3/uL (ref 0.1–1.0)
Monocytes Relative: 13 %
Neutro Abs: 2.1 10*3/uL (ref 1.7–7.7)
Neutrophils Relative %: 37 %
Platelets: 211 10*3/uL (ref 150–400)
RBC: 3.13 MIL/uL — ABNORMAL LOW (ref 4.22–5.81)
RDW: 17.4 % — ABNORMAL HIGH (ref 11.5–15.5)
WBC: 5.8 10*3/uL (ref 4.0–10.5)

## 2017-10-26 LAB — BASIC METABOLIC PANEL
Anion gap: 9 (ref 5–15)
BUN: 34 mg/dL — ABNORMAL HIGH (ref 6–20)
CO2: 24 mmol/L (ref 22–32)
Calcium: 8.7 mg/dL — ABNORMAL LOW (ref 8.9–10.3)
Chloride: 107 mmol/L (ref 101–111)
Creatinine, Ser: 1.52 mg/dL — ABNORMAL HIGH (ref 0.61–1.24)
GFR calc Af Amer: 57 mL/min — ABNORMAL LOW (ref >60)
GFR calc non Af Amer: 49 mL/min — ABNORMAL LOW (ref >60)
Glucose, Bld: 158 mg/dL — ABNORMAL HIGH (ref 65–99)
Potassium: 3.8 mmol/L (ref 3.5–5.1)
Sodium: 140 mmol/L (ref 135–145)

## 2017-10-26 LAB — VANCOMYCIN, TROUGH: Vancomycin Tr: 14 ug/mL — ABNORMAL LOW (ref 15–20)

## 2017-10-28 ENCOUNTER — Encounter (HOSPITAL_COMMUNITY): Payer: BLUE CROSS/BLUE SHIELD

## 2017-10-28 ENCOUNTER — Encounter: Payer: BLUE CROSS/BLUE SHIELD | Admitting: Vascular Surgery

## 2017-10-30 ENCOUNTER — Other Ambulatory Visit: Payer: Self-pay | Admitting: Pharmacist

## 2017-10-30 NOTE — Progress Notes (Signed)
[facility] pharmacy lab review  

## 2017-11-03 NOTE — Progress Notes (Signed)
Patient is scheduled 7/2.  He called yesterday to see when his follow up was, when he would be done with antibiotics (6/4).  RN gave him follow up information, said I would confirm if [place] has stop date/pull PICC orders.  Per Jackelyn Poling at [facility], they had everything they needed (stop date of 6/4, pull PICC at end of treatment) and did not need additional orders. Does he need to come in before 7/2? He is doing better with PT, but still having difficulty walking. Landis Gandy, RN

## 2017-11-05 ENCOUNTER — Telehealth: Payer: Self-pay | Admitting: *Deleted

## 2017-11-05 NOTE — Telephone Encounter (Signed)
-----   Message from Golden Circle, [place] sent at 11/03/2017  9:46 AM EDT ----- Yes, if possible would like to see him in the next week.   Thanks for the update! ----- Message ----- From: Landis Gandy, RN Sent: 11/03/2017   9:25 AM To: Golden Circle, FNP    ----- Message ----- From: Golden Circle, FNP Sent: 11/02/2017   1:48 PM To: De Hollingshead, #  Please schedule patient for follow up.  Can we see if home health will be pulling his PICC line?

## 2017-11-05 NOTE — Telephone Encounter (Signed)
[name] is still working with Physicial Therapy, working on being able to walk outside of the house. He is tentatively scheduled for 6/14 at 11:00.  He will call if he is still unable to get out of the house by then.  Landis Gandy, RN

## 2017-11-06 ENCOUNTER — Telehealth: Payer: Self-pay

## 2017-11-06 NOTE — Telephone Encounter (Signed)
Pt called today asking why his appt was moved up to an earlier date. PT was concerned and wondering if something was wrong. Looks like rn spoke with pt yesterday to inform pt that NP was hoping to see pt as a f/u on he is doing. Relayed this message back to pt who was able okay with this and calmed down. [place]

## 2017-11-09 ENCOUNTER — Other Ambulatory Visit: Payer: Self-pay

## 2017-11-09 ENCOUNTER — Emergency Department (HOSPITAL_COMMUNITY): Payer: BLUE CROSS/BLUE SHIELD

## 2017-11-09 ENCOUNTER — Telehealth: Payer: Self-pay | Admitting: *Deleted

## 2017-11-09 ENCOUNTER — Inpatient Hospital Stay (HOSPITAL_COMMUNITY)
Admission: EM | Admit: 2017-11-09 | Discharge: 2017-11-12 | DRG: 698 | Disposition: A | Discharge status: Home Health | Payer: BLUE CROSS/BLUE SHIELD | Attending: Internal Medicine | Admitting: Internal Medicine

## 2017-11-09 ENCOUNTER — Encounter (HOSPITAL_COMMUNITY): Payer: Self-pay | Admitting: Emergency Medicine

## 2017-11-09 DIAGNOSIS — M109 Gout, unspecified: Secondary | ICD-10-CM | POA: Diagnosis present

## 2017-11-09 DIAGNOSIS — Z794 Long term (current) use of insulin: Secondary | ICD-10-CM

## 2017-11-09 DIAGNOSIS — N3000 Acute cystitis without hematuria: Secondary | ICD-10-CM | POA: Diagnosis not present

## 2017-11-09 DIAGNOSIS — Z9981 Dependence on supplemental oxygen: Secondary | ICD-10-CM

## 2017-11-09 DIAGNOSIS — K529 Noninfective gastroenteritis and colitis, unspecified: Secondary | ICD-10-CM | POA: Diagnosis present

## 2017-11-09 DIAGNOSIS — K861 Other chronic pancreatitis: Secondary | ICD-10-CM | POA: Diagnosis present

## 2017-11-09 DIAGNOSIS — I130 Hypertensive heart and chronic kidney disease with heart failure and stage 1 through stage 4 chronic kidney disease, or unspecified chronic kidney disease: Secondary | ICD-10-CM | POA: Diagnosis present

## 2017-11-09 DIAGNOSIS — I5022 Chronic systolic (congestive) heart failure: Secondary | ICD-10-CM | POA: Diagnosis not present

## 2017-11-09 DIAGNOSIS — Z833 Family history of diabetes mellitus: Secondary | ICD-10-CM

## 2017-11-09 DIAGNOSIS — Z8249 Family history of ischemic heart disease and other diseases of the circulatory system: Secondary | ICD-10-CM

## 2017-11-09 DIAGNOSIS — Y846 Urinary catheterization as the cause of abnormal reaction of the patient, or of later complication, without mention of misadventure at the time of the procedure: Secondary | ICD-10-CM | POA: Diagnosis present

## 2017-11-09 DIAGNOSIS — Z955 Presence of coronary angioplasty implant and graft: Secondary | ICD-10-CM

## 2017-11-09 DIAGNOSIS — IMO0001 Reserved for inherently not codable concepts without codable children: Secondary | ICD-10-CM

## 2017-11-09 DIAGNOSIS — A4159 Other Gram-negative sepsis: Secondary | ICD-10-CM | POA: Diagnosis present

## 2017-11-09 DIAGNOSIS — E1122 Type 2 diabetes mellitus with diabetic chronic kidney disease: Secondary | ICD-10-CM | POA: Diagnosis present

## 2017-11-09 DIAGNOSIS — R509 Fever, unspecified: Secondary | ICD-10-CM | POA: Diagnosis not present

## 2017-11-09 DIAGNOSIS — N390 Urinary tract infection, site not specified: Secondary | ICD-10-CM | POA: Diagnosis not present

## 2017-11-09 DIAGNOSIS — Z91018 Allergy to other foods: Secondary | ICD-10-CM

## 2017-11-09 DIAGNOSIS — E785 Hyperlipidemia, unspecified: Secondary | ICD-10-CM | POA: Diagnosis present

## 2017-11-09 DIAGNOSIS — N183 Chronic kidney disease, stage 3 unspecified: Secondary | ICD-10-CM

## 2017-11-09 DIAGNOSIS — A419 Sepsis, unspecified organism: Secondary | ICD-10-CM

## 2017-11-09 DIAGNOSIS — Z87442 Personal history of urinary calculi: Secondary | ICD-10-CM

## 2017-11-09 DIAGNOSIS — C9001 Multiple myeloma in remission: Secondary | ICD-10-CM

## 2017-11-09 DIAGNOSIS — I872 Venous insufficiency (chronic) (peripheral): Secondary | ICD-10-CM | POA: Diagnosis present

## 2017-11-09 DIAGNOSIS — I10 Essential (primary) hypertension: Secondary | ICD-10-CM | POA: Diagnosis present

## 2017-11-09 DIAGNOSIS — T83511A Infection and inflammatory reaction due to indwelling urethral catheter, initial encounter: Secondary | ICD-10-CM

## 2017-11-09 DIAGNOSIS — G4730 Sleep apnea, unspecified: Secondary | ICD-10-CM | POA: Diagnosis present

## 2017-11-09 DIAGNOSIS — Z7982 Long term (current) use of aspirin: Secondary | ICD-10-CM

## 2017-11-09 DIAGNOSIS — E118 Type 2 diabetes mellitus with unspecified complications: Secondary | ICD-10-CM

## 2017-11-09 DIAGNOSIS — N200 Calculus of kidney: Secondary | ICD-10-CM | POA: Diagnosis present

## 2017-11-09 DIAGNOSIS — T83518A Infection and inflammatory reaction due to other urinary catheter, initial encounter: Secondary | ICD-10-CM | POA: Diagnosis not present

## 2017-11-09 DIAGNOSIS — Z6841 Body Mass Index (BMI) 40.0 and over, adult: Secondary | ICD-10-CM

## 2017-11-09 DIAGNOSIS — I480 Paroxysmal atrial fibrillation: Secondary | ICD-10-CM | POA: Diagnosis present

## 2017-11-09 DIAGNOSIS — I2510 Atherosclerotic heart disease of native coronary artery without angina pectoris: Secondary | ICD-10-CM | POA: Diagnosis present

## 2017-11-09 DIAGNOSIS — L97519 Non-pressure chronic ulcer of other part of right foot with unspecified severity: Secondary | ICD-10-CM | POA: Diagnosis present

## 2017-11-09 DIAGNOSIS — M1990 Unspecified osteoarthritis, unspecified site: Secondary | ICD-10-CM | POA: Diagnosis present

## 2017-11-09 DIAGNOSIS — I4892 Unspecified atrial flutter: Secondary | ICD-10-CM | POA: Diagnosis present

## 2017-11-09 DIAGNOSIS — C9000 Multiple myeloma not having achieved remission: Secondary | ICD-10-CM | POA: Diagnosis present

## 2017-11-09 DIAGNOSIS — N12 Tubulo-interstitial nephritis, not specified as acute or chronic: Secondary | ICD-10-CM | POA: Diagnosis present

## 2017-11-09 DIAGNOSIS — N189 Chronic kidney disease, unspecified: Secondary | ICD-10-CM

## 2017-11-09 DIAGNOSIS — Z7901 Long term (current) use of anticoagulants: Secondary | ICD-10-CM

## 2017-11-09 DIAGNOSIS — I252 Old myocardial infarction: Secondary | ICD-10-CM

## 2017-11-09 DIAGNOSIS — E11621 Type 2 diabetes mellitus with foot ulcer: Secondary | ICD-10-CM | POA: Diagnosis present

## 2017-11-09 DIAGNOSIS — E119 Type 2 diabetes mellitus without complications: Secondary | ICD-10-CM

## 2017-11-09 DIAGNOSIS — K219 Gastro-esophageal reflux disease without esophagitis: Secondary | ICD-10-CM | POA: Diagnosis present

## 2017-11-09 DIAGNOSIS — Z87891 Personal history of nicotine dependence: Secondary | ICD-10-CM

## 2017-11-09 LAB — URINALYSIS, ROUTINE W REFLEX MICROSCOPIC
Bilirubin Urine: NEGATIVE
Glucose, UA: NEGATIVE mg/dL
Ketones, ur: NEGATIVE mg/dL
Nitrite: NEGATIVE
Protein, ur: 100 mg/dL — AB
RBC / HPF: >50 RBC/hpf — ABNORMAL HIGH (ref 0–5)
Specific Gravity, Urine: 1.009 (ref 1.005–1.030)
WBC, UA: >50 WBC/hpf — ABNORMAL HIGH (ref 0–5)
pH: 5 (ref 5.0–8.0)

## 2017-11-09 LAB — CBC WITH DIFFERENTIAL/PLATELET
Basophils Absolute: 0 10*3/uL (ref 0.0–0.1)
Basophils Relative: 0 %
Eosinophils Absolute: 0.2 10*3/uL (ref 0.0–0.7)
Eosinophils Relative: 2 %
HCT: 31.1 % — ABNORMAL LOW (ref 39.0–52.0)
Hemoglobin: 9.7 g/dL — ABNORMAL LOW (ref 13.0–17.0)
Lymphocytes Relative: 19 %
Lymphs Abs: 2 10*3/uL (ref 0.7–4.0)
MCH: 30 pg (ref 26.0–34.0)
MCHC: 31.2 g/dL (ref 30.0–36.0)
MCV: 96.3 fL (ref 78.0–100.0)
Monocytes Absolute: 0.8 10*3/uL (ref 0.1–1.0)
Monocytes Relative: 8 %
Neutro Abs: 7.3 10*3/uL (ref 1.7–7.7)
Neutrophils Relative %: 71 %
Platelets: 187 10*3/uL (ref 150–400)
RBC: 3.23 MIL/uL — ABNORMAL LOW (ref 4.22–5.81)
RDW: 17.8 % — ABNORMAL HIGH (ref 11.5–15.5)
WBC: 10.2 10*3/uL (ref 4.0–10.5)

## 2017-11-09 LAB — COMPREHENSIVE METABOLIC PANEL
ALT: 23 U/L (ref 17–63)
AST: 36 U/L (ref 15–41)
Albumin: 3.1 g/dL — ABNORMAL LOW (ref 3.5–5.0)
Alkaline Phosphatase: 102 U/L (ref 38–126)
Anion gap: 10 (ref 5–15)
BUN: 40 mg/dL — ABNORMAL HIGH (ref 6–20)
CO2: 25 mmol/L (ref 22–32)
Calcium: 8.9 mg/dL (ref 8.9–10.3)
Chloride: 106 mmol/L (ref 101–111)
Creatinine, Ser: 1.54 mg/dL — ABNORMAL HIGH (ref 0.61–1.24)
GFR calc Af Amer: 56 mL/min — ABNORMAL LOW (ref >60)
GFR calc non Af Amer: 48 mL/min — ABNORMAL LOW (ref >60)
Glucose, Bld: 141 mg/dL — ABNORMAL HIGH (ref 65–99)
Potassium: 4 mmol/L (ref 3.5–5.1)
Sodium: 141 mmol/L (ref 135–145)
Total Bilirubin: 0.8 mg/dL (ref 0.3–1.2)
Total Protein: 6.7 g/dL (ref 6.5–8.1)

## 2017-11-09 LAB — LACTIC ACID, PLASMA: Lactic Acid, Venous: 1.4 mmol/L (ref 0.5–1.9)

## 2017-11-09 LAB — PROTIME-INR
INR: 2.18
Prothrombin Time: 24.1 seconds — ABNORMAL HIGH (ref 11.4–15.2)

## 2017-11-09 LAB — TROPONIN I: Troponin I: 0.03 ng/mL (ref <0.03)

## 2017-11-09 MED ORDER — TORSEMIDE 20 MG PO TABS
20.0000 mg | ORAL_TABLET | Freq: Two times a day (BID) | ORAL | Status: DC
  Administered 2017-11-10 (×2): 20 mg via ORAL
  Filled 2017-11-09 (×2): qty 1

## 2017-11-09 MED ORDER — POTASSIUM CHLORIDE CRYS ER 20 MEQ PO TBCR
20.0000 meq | EXTENDED_RELEASE_TABLET | Freq: Every day | ORAL | Status: DC
  Administered 2017-11-09 – 2017-11-12 (×4): 20 meq via ORAL
  Filled 2017-11-09 (×4): qty 1

## 2017-11-09 MED ORDER — ACYCLOVIR 200 MG PO CAPS
400.0000 mg | ORAL_CAPSULE | Freq: Every morning | ORAL | Status: DC
  Administered 2017-11-10 – 2017-11-12 (×3): 400 mg via ORAL
  Filled 2017-11-09 (×3): qty 2

## 2017-11-09 MED ORDER — GABAPENTIN 300 MG PO CAPS
300.0000 mg | ORAL_CAPSULE | Freq: Two times a day (BID) | ORAL | Status: DC
  Administered 2017-11-09 – 2017-11-12 (×6): 300 mg via ORAL
  Filled 2017-11-09 (×6): qty 1

## 2017-11-09 MED ORDER — VANCOMYCIN HCL IN DEXTROSE 1-5 GM/200ML-% IV SOLN
1000.0000 mg | Freq: Once | INTRAVENOUS | Status: AC
  Administered 2017-11-09: 1000 mg via INTRAVENOUS
  Filled 2017-11-09: qty 200

## 2017-11-09 MED ORDER — INSULIN ASPART 100 UNIT/ML ~~LOC~~ SOLN: 0.0000 [IU] | Freq: Two times a day (BID) | SUBCUTANEOUS | Status: DC

## 2017-11-09 MED ORDER — METHOCARBAMOL 500 MG PO TABS: 500.0000 mg | ORAL_TABLET | Freq: Three times a day (TID) | ORAL | Status: DC | PRN

## 2017-11-09 MED ORDER — SODIUM CHLORIDE 0.9% FLUSH: 3.0000 mL | INTRAVENOUS | Status: DC | PRN

## 2017-11-09 MED ORDER — ACETAMINOPHEN 500 MG PO TABS
500.0000 mg | ORAL_TABLET | Freq: Four times a day (QID) | ORAL | Status: DC | PRN
  Administered 2017-11-10 – 2017-11-12 (×3): 1000 mg via ORAL
  Filled 2017-11-09 (×3): qty 2

## 2017-11-09 MED ORDER — ALLOPURINOL 300 MG PO TABS
300.0000 mg | ORAL_TABLET | Freq: Every day | ORAL | Status: DC
  Administered 2017-11-10 – 2017-11-12 (×3): 300 mg via ORAL
  Filled 2017-11-09 (×3): qty 1

## 2017-11-09 MED ORDER — RIVAROXABAN 20 MG PO TABS
20.0000 mg | ORAL_TABLET | Freq: Every day | ORAL | Status: DC
  Administered 2017-11-10 – 2017-11-11 (×2): 20 mg via ORAL
  Filled 2017-11-09 (×2): qty 1

## 2017-11-09 MED ORDER — ASPIRIN EC 81 MG PO TBEC
81.0000 mg | DELAYED_RELEASE_TABLET | Freq: Every day | ORAL | Status: DC
  Administered 2017-11-09 – 2017-11-12 (×4): 81 mg via ORAL
  Filled 2017-11-09 (×4): qty 1

## 2017-11-09 MED ORDER — VANCOMYCIN HCL IN DEXTROSE 1-5 GM/200ML-% IV SOLN: 1000.0000 mg | Freq: Once | INTRAVENOUS | Status: DC

## 2017-11-09 MED ORDER — ATORVASTATIN CALCIUM 40 MG PO TABS
80.0000 mg | ORAL_TABLET | Freq: Every day | ORAL | Status: DC
  Administered 2017-11-09 – 2017-11-11 (×3): 80 mg via ORAL
  Filled 2017-11-09 (×3): qty 2

## 2017-11-09 MED ORDER — LORATADINE 10 MG PO TABS
10.0000 mg | ORAL_TABLET | Freq: Every morning | ORAL | Status: DC
  Administered 2017-11-10 – 2017-11-12 (×3): 10 mg via ORAL
  Filled 2017-11-09 (×3): qty 1

## 2017-11-09 MED ORDER — INSULIN DETEMIR 100 UNIT/ML ~~LOC~~ SOLN
10.0000 [IU] | Freq: Every morning | SUBCUTANEOUS | Status: DC
  Administered 2017-11-10 – 2017-11-12 (×3): 10 [IU] via SUBCUTANEOUS
  Filled 2017-11-09 (×4): qty 0.1

## 2017-11-09 MED ORDER — PIPERACILLIN-TAZOBACTAM 3.375 G IVPB
3.3750 g | Freq: Three times a day (TID) | INTRAVENOUS | Status: DC
  Administered 2017-11-10 – 2017-11-11 (×5): 3.375 g via INTRAVENOUS
  Filled 2017-11-09 (×5): qty 50

## 2017-11-09 MED ORDER — ONDANSETRON 4 MG PO TBDP: 4.0000 mg | ORAL_TABLET | Freq: Three times a day (TID) | ORAL | Status: DC | PRN

## 2017-11-09 MED ORDER — ELUXADOLINE 100 MG PO TABS
100.0000 mg | ORAL_TABLET | Freq: Two times a day (BID) | ORAL | Status: DC
  Administered 2017-11-11 – 2017-11-12 (×3): 100 mg via ORAL
  Filled 2017-11-09 (×7): qty 1

## 2017-11-09 MED ORDER — SODIUM CHLORIDE 0.9 % IV SOLN: 250.0000 mL | INTRAVENOUS | Status: DC | PRN

## 2017-11-09 MED ORDER — ADULT MULTIVITAMIN W/MINERALS CH
1.0000 | ORAL_TABLET | Freq: Every day | ORAL | Status: DC
  Administered 2017-11-10 – 2017-11-12 (×3): 1 via ORAL
  Filled 2017-11-09 (×3): qty 1

## 2017-11-09 MED ORDER — ACETAMINOPHEN 325 MG PO TABS
650.0000 mg | ORAL_TABLET | Freq: Once | ORAL | Status: AC
  Administered 2017-11-09: 650 mg via ORAL
  Filled 2017-11-09: qty 2

## 2017-11-09 MED ORDER — SODIUM CHLORIDE 0.9 % IV SOLN
1000.0000 mL | INTRAVENOUS | Status: DC
  Administered 2017-11-09: 1000 mL via INTRAVENOUS

## 2017-11-09 MED ORDER — CHLORPROMAZINE HCL 25 MG PO TABS
25.0000 mg | ORAL_TABLET | Freq: Four times a day (QID) | ORAL | Status: DC | PRN
  Filled 2017-11-09: qty 1

## 2017-11-09 MED ORDER — SODIUM CHLORIDE 0.9% FLUSH
3.0000 mL | Freq: Two times a day (BID) | INTRAVENOUS | Status: DC
  Administered 2017-11-09 – 2017-11-12 (×6): 3 mL via INTRAVENOUS

## 2017-11-09 MED ORDER — CALCITRIOL 0.25 MCG PO CAPS
0.2500 ug | ORAL_CAPSULE | ORAL | Status: DC
  Administered 2017-11-11: 0.25 ug via ORAL

## 2017-11-09 MED ORDER — TRAMADOL HCL 50 MG PO TABS
50.0000 mg | ORAL_TABLET | Freq: Four times a day (QID) | ORAL | Status: AC | PRN
  Administered 2017-11-09 – 2017-11-10 (×2): 50 mg via ORAL
  Filled 2017-11-09 (×2): qty 1

## 2017-11-09 MED ORDER — PIPERACILLIN-TAZOBACTAM 3.375 G IVPB 30 MIN
3.3750 g | Freq: Once | INTRAVENOUS | Status: AC
  Administered 2017-11-09: 3.375 g via INTRAVENOUS
  Filled 2017-11-09: qty 50

## 2017-11-09 NOTE — ED Provider Notes (Signed)
[facility] Provider Note   CSN: [id number] Arrival date & time: 11/09/17  1643     History   Chief Complaint Chief Complaint  Patient presents with  . Code Sepsis    HPI [name] is a 57 y.o. male.  HPI  Pt was seen at 1650. Per pt, c/o gradual onset and persistence of constant home "chills" and "fevers" for the past 2 days. EMS states pt was "102" en route. LD APAP was 0600 this morning. Pt states his foley bag has been leaking and he has seen "cloudy urine" for the past 2 to 3 days. Pt states the foley was placed 1 week ago and he has "seen sediment" in his urine since that time. States the Home Health RN was "supposed to come out today and look at it" but "they didn't." Denies abd pain, no N/V/D, no back pain, no rash, no CP/SOB, no cough.    Past Medical History:  Diagnosis Date  . Abscess 04/2017   Scrotal  . Anemia   . Arteriosclerotic cardiovascular disease (ASCVD)    MI-2000s; stent to the proximal LAD and diagonal in 2001; stress nuclear in 2008-impaired exercise capacity, left ventricular dilatation, moderately to severely depressed EF, apical, inferior and anteroseptal scar  . Arthritis   . Atrial flutter ([place])   . 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 Cellulitis of leg    both legs  . Chronic diarrhea   . Chronic kidney disease, stage 3, mod decreased GFR (HCC)    Creatinine of 1.84 in 06/2011 and 1.5 in 07/2011  . Diabetes mellitus    Insulin  . Dysrhythmia    AFlutter  . GERD (gastroesophageal reflux disease)   . Gout   . Hyperlipidemia   . Hypertension   . Injection site reaction   . Multiple myeloma 07/01/2011  . Myocardial infarction ([place]) 2000  . Obesity   . Pedal edema    Venous insufficiency  . Sleep apnea    uses cpap  . Ulcer     Patient Active Problem List   Diagnosis Date Noted  . MRSA bacteremia 10/03/2017  . E. coli bacteremia 10/03/2017  . Acute respiratory failure with hypoxia ([place])   . Acute  encephalopathy   . Septic shock ([place]) 09/30/2017  . CKD (chronic kidney disease) 08/13/2017  . Pressure injury of skin 08/04/2017  . AKI (acute kidney injury) ([place]) 07/31/2017  . Fournier's gangrene of scrotum 04/14/2017  . CAP (community acquired pneumonia) 06/10/2016  . Hypogammaglobulinemia ([place]) 03/09/2016  . Diaphragmatic hernia without obstruction 02/26/2016  . Chronic systolic heart failure ([place]) 12/19/2013  . Anemia, normocytic normochromic 10/09/2012  . Chronic pancreatitis ([place]) 10/07/2012  . Atrial flutter ([place]) 09/17/2012  . Urinary tract infection with hematuria 09/07/2012  . DDD (degenerative disc disease), cervical 03/18/2012  . Arteriosclerotic cardiovascular disease (ASCVD)   . Hypertension   . Hyperlipidemia   . Multiple myeloma ([place]) 07/01/2011  . Morbid obesity ([place]) 04/29/2010  . Insulin dependent diabetes mellitus ([place]) 11/15/2008  . Sleep apnea 11/15/2008    Past Surgical History:  Procedure Laterality Date  . ABSCESS DRAINAGE     Scrotal  . BIOPSY  01/02/2012   Procedure: BIOPSY;  Surgeon: Rogene Houston, MD;  [facility];  Service: Endoscopy;  Laterality: N/A;  . BONE MARROW BIOPSY  05/13/11  . CARDIAC CATHETERIZATION     cardiac stent  . CARDIOVERSION N/A 10/13/2012   Procedure: CARDIOVERSION;  Surgeon: Yehuda Savannah, MD;  Location: [facility];  Service: Cardiovascular;  Laterality: N/A;  . CATARACT EXTRACTION W/PHACO Left 02/13/2014   Procedure: CATARACT EXTRACTION PHACO AND INTRAOCULAR LENS PLACEMENT (IOC);  Surgeon: Tonny Branch, MD;  Location: [facility];  Service: Ophthalmology;  Laterality: Left;  CDE:  7.67  . CATARACT EXTRACTION W/PHACO Right 03/02/2014   Procedure: CATARACT EXTRACTION PHACO AND INTRAOCULAR LENS PLACEMENT RIGHT EYE CDE=16.81;  Surgeon: Tonny Branch, MD;  Location: [facility];  Service: Ophthalmology;  Laterality: Right;  . COLONOSCOPY  11/28/2011   Procedure: COLONOSCOPY;  Surgeon: Rogene Houston, MD;  [facility];   Service: Endoscopy;  Laterality: N/A;  930  . CYSTOSCOPY W/ URETERAL STENT PLACEMENT Right 07/31/2017   Procedure: CYSTOSCOPY WITH RETROGRADE PYELOGRAM/URETERAL STENT PLACEMENT;  Surgeon: Cleon Gustin, MD;  Location: [facility];  Service: Urology;  Laterality: Right;  . ESOPHAGOGASTRODUODENOSCOPY  01/02/2012   Procedure: ESOPHAGOGASTRODUODENOSCOPY (EGD);  Surgeon: Rogene Houston, MD;  [facility];  Service: Endoscopy;  Laterality: N/A;  100  . ESOPHAGOGASTRODUODENOSCOPY N/A 09/20/2012   Procedure: ESOPHAGOGASTRODUODENOSCOPY (EGD);  Surgeon: Rogene Houston, MD;  [facility];  Service: Endoscopy;  Laterality: N/A;  . EUS N/A 10/07/2012   Procedure: UPPER ENDOSCOPIC ULTRASOUND (EUS) LINEAR;  Surgeon: Milus Banister, MD;  [facility];  Service: Endoscopy;  Laterality: N/A;  . INCISION AND DRAINAGE ABSCESS N/A 04/14/2017   Procedure: INCISION AND DRAINAGE ABSCESS;  Surgeon: Ceasar Mons, MD;  [facility];  Service: Urology;  Laterality: N/A;  . INCISION AND DRAINAGE ABSCESS ANAL    . IRRIGATION AND DEBRIDEMENT ABSCESS N/A 04/15/2017   Procedure: DEBRIDEMENT SCROTAL WOUND AND DRESSING CHANGE;  Surgeon: Ceasar Mons, MD;  [facility];  Service: Urology;  Laterality: N/A;  . IRRIGATION AND DEBRIDEMENT ABSCESS N/A 04/17/2017   Procedure: IRRIGATION AND DEBRIDEMENT ABSCESS;  Surgeon: Ceasar Mons, MD;  [facility];  Service: Urology;  Laterality: N/A;  RM 3  . LAPAROSCOPIC GASTRIC BANDING  2006   has been removed  . OSTECTOMY Right 04/08/2017   Procedure: OSTECTOMY RIGHT GREAT TOE;  Surgeon: Caprice Beaver, DPM;  Location: [facility];  Service: Podiatry;  Laterality: Right;  . PORT-A-CATH REMOVAL Left 12/07/2012   Procedure: REMOVAL PORT-A-CATH;  Surgeon: Scherry Ran, MD;  Location: [facility];  Service: General;  Laterality: Left;  . PORT-A-CATH REMOVAL N/A 10/02/2017   Procedure: MINOR REMOVAL PORT-A-CATH AT BEDSIDE;   Surgeon: Donnie Mesa, MD;  Location: [facility];  Service: General;  Laterality: N/A;  . PORTACATH PLACEMENT  07/07/2011   Procedure: INSERTION PORT-A-CATH;  Surgeon: Scherry Ran, MD;  Location: [facility];  Service: General;  Laterality: N/A;  . PORTACATH PLACEMENT N/A 12/07/2012   Procedure: INSERTION PORT-A-CATH;  Surgeon: Scherry Ran, MD;  Location: [facility];  Service: General;  Laterality: N/A;  Attempted portacath placement on left and right side  . WOUND DEBRIDEMENT Right 04/08/2017   Procedure: EXCISION ULCERATION RIGHT GREAT TOE;  Surgeon: Caprice Beaver, DPM;  Location: [facility];  Service: Podiatry;  Laterality: Right;  . WRIST SURGERY     Left; removal of bone fragment        Home Medications    Prior to Admission medications   Medication Sig Start Date End Date Taking? Authorizing Provider  acetaminophen (TYLENOL) 500 MG tablet Take 500-1,000 mg by mouth every 6 (six) hours as needed for mild pain or moderate pain.    [provider]  acyclovir (ZOVIRAX) 400 MG tablet Take  1 tablet (400 mg total) by mouth every morning. 02/18/17   Twana First, MD  allopurinol (ZYLOPRIM) 300 MG tablet TAKE ONE TABLET BY MOUTH ONCE DAILY. 09/15/17   Holley Bouche, NP  Artificial Tear Ointment (DRY EYES OP) Place 1 drop into both eyes as needed (dry eyes).    [provider]  aspirin EC 81 MG tablet Take 81 mg by mouth daily.    [provider]  atorvastatin (LIPITOR) 80 MG tablet TAKE ONE TABLET BY MOUTH AT BEDTIME. 12/18/16   Herminio Commons, MD  calcitRIOL (ROCALTROL) 0.25 MCG capsule Take 0.25 mcg by mouth 3 (three) times a week. Monday, Wednesday, Friday. 12/14/13   [provider]  Calcium Carbonate (CALCIUM 600 PO) Take 1 tablet by mouth 2 (two) times daily.    [provider]  chlorproMAZINE (THORAZINE) 25 MG tablet Take 1 tablet (25 mg total) by mouth 4 (four) times daily as needed for hiccoughs. 10/08/17   Rai, Vernelle Emerald, MD    dexlansoprazole (DEXILANT) 60 MG capsule Take 60 mg by mouth every morning.    [provider]  dicyclomine (BENTYL) 20 MG tablet TAKE 1 TABLET BY MOUTH BEFORE MEALS 3 TIMES DAILY. 09/11/17   Rehman, Mechele Dawley, MD  Eluxadoline (VIBERZI) 100 MG TABS Take 100 mg 2 (two) times daily with a meal by mouth.     [provider]  gabapentin (NEURONTIN) 300 MG capsule Take 1 capsule (300 mg total) by mouth 2 (two) times daily. 04/22/17   Janece Canterbury, MD  insulin detemir (LEVEMIR) 100 UNIT/ML injection Inject 0.1 mLs (10 Units total) into the skin every morning. 04/22/17   Janece Canterbury, MD  insulin lispro (HUMALOG) 100 UNIT/ML injection Inject 0-0.1 mLs (0-10 Units total) into the skin 2 (two) times daily with a meal. Sliding Scale per patient  CBG 150-200: 1 units CBG 201-275:  3 units CBG 275-350: 5 units 351-425: 7 units 425-500: 9 units 500+: 10 units 04/22/17   Short, Noah Delaine, MD  loratadine (CLARITIN) 10 MG tablet Take 10 mg by mouth every morning.     [provider]  Magnesium Cl-Calcium Carbonate (SLOW-MAG PO) Take 1 tablet by mouth every morning.    [provider]  methocarbamol (ROBAXIN) 500 MG tablet Take 1 tablet (500 mg total) by mouth every 8 (eight) hours as needed for muscle spasms. 08/04/17   Lavina Hamman, MD  Multiple Vitamins-Minerals (MULTIVITAMINS THER. W/MINERALS) TABS Take 1 tablet by mouth daily.     [provider]  nitroGLYCERIN (NITROSTAT) 0.4 MG SL tablet DISSOLVE 1 TABLET UNDER TONGUE EVERY 5 MINUTES UP TO 15 MIN FOR CHESTPAIN. IF NO RELIEF CALL 911. 05/11/17   Holley Bouche, NP  ondansetron (ZOFRAN ODT) 4 MG disintegrating tablet Take 1 tablet (4 mg total) by mouth every 8 (eight) hours as needed for nausea or vomiting. 10/08/17   Rai, Ripudeep K, MD  potassium chloride SA (K-DUR,KLOR-CON) 20 MEQ tablet Take 1 tablet (20 mEq total) by mouth daily. 08/05/17   Lavina Hamman, MD  torsemide (DEMADEX) 20 MG tablet Take 1  tablet (20 mg total) by mouth 2 (two) times daily. Take extra 1 tab for weight gain of 3 lbs in 1 day or 5 lbs in 2 days 10/08/17   Rai, Ripudeep K, MD  Vitamin D, Ergocalciferol, (DRISDOL) 50000 units CAPS capsule TAKE 1 CAPSULE BY MOUTH EVERY THIRTY DAYS. 05/11/17   Holley Bouche, NP  XARELTO 20 MG TABS tablet TAKE 1  TABLET BY MOUTH DAILY. 09/21/17   Herminio Commons, MD    Family History Family History  Problem Relation Age of Onset  . Heart disease Mother   . Cancer Mother   . Diabetes Father   . Arthritis Unknown   . Anesthesia problems Neg Hx   . Hypotension Neg Hx   . Malignant hyperthermia Neg Hx   . Pseudochol deficiency Neg Hx     Social History Social History   Tobacco Use  . Smoking status: Former Smoker    Packs/day: 0.25    Years: 1.00    Pack years: 0.25    Types: Cigarettes, Cigars    Last attempt to quit: 05/17/2001    Years since quitting: 16.4  . Smokeless tobacco: Never Used  Substance Use Topics  . Alcohol use: No    Alcohol/week: 0.0 oz  . Drug use: No     Allergies   Beef-derived products and Pork-derived products   Review of Systems Review of Systems ROS: Statement: All systems negative except as marked or noted in the HPI; Constitutional: +fever and chills. ; ; Eyes: Negative for eye pain, redness and discharge. ; ; ENMT: Negative for ear pain, hoarseness, nasal congestion, sinus pressure and sore throat. ; ; Cardiovascular: Negative for chest pain, palpitations, diaphoresis, dyspnea and peripheral edema. ; ; Respiratory: Negative for cough, wheezing and stridor. ; ; Gastrointestinal: Negative for nausea, vomiting, diarrhea, abdominal pain, blood in stool, hematemesis, jaundice and rectal bleeding. . ; ; Genitourinary: +cloudy urine. Negative for dysuria, flank pain and hematuria. ; ; Genital:  No penile drainage or rash, no testicular pain or swelling, no scrotal rash or swelling. ;; Musculoskeletal: Negative for back pain and neck pain.  Negative for swelling and trauma.; ; Skin: Negative for pruritus, rash, abrasions, blisters, bruising and skin lesion.; ; Neuro: Negative for headache, lightheadedness and neck stiffness. Negative for weakness, altered level of consciousness, altered mental status, extremity weakness, paresthesias, involuntary movement, seizure and syncope.       Physical Exam Updated Vital Signs BP (!) 134/120 (BP Location: Right Arm)   Pulse 88   Temp (!) 103.4 F (39.7 C) (Rectal)   Resp (!) 25   Wt (!) 181.4 kg (400 lb)   SpO2 94%   BMI 51.36 kg/m    Patient Vitals for the past 24 hrs:  BP Temp Temp src Pulse Resp SpO2 Weight  11/09/17 1845 (!) 155/133 - - 81 (!) 24 94 % -  11/09/17 1830 (!) 138/124 - - 92 (!) 37 91 % -  11/09/17 1815 (!) 143/55 - - 94 (!) 27 93 % -  11/09/17 1801 (!) 150/73 - - 92 19 96 % -  11/09/17 1745 139/82 - - 93 19 94 % -  11/09/17 1730 (!) 134/120 - - 88 (!) 25 94 % -  11/09/17 1721 - - - - - - (!) 181.4 kg (400 lb)  11/09/17 1700 (!) 113/100 - - 89 (!) 24 97 % -  11/09/17 1655 133/72 (!) 103.4 F (39.7 C) Rectal 85 12 95 % -     Physical Exam 1655: Physical examination:  Nursing notes reviewed; Vital signs and O2 SAT reviewed; +febrile.;; Constitutional: Well developed, Well nourished, In no acute distress; Head:  Normocephalic, atraumatic; Eyes: EOMI, PERRL, No scleral icterus; ENMT: Mouth and pharynx normal, Mucous membranes dry; Neck: Supple, Full range of motion, No lymphadenopathy; Cardiovascular: Tachycardic rate and rhythm, No gallop; Respiratory: Breath sounds clear & equal bilaterally, No rales,  rhonchi, wheezes.  Speaking full sentences with ease, Normal respiratory effort/excursion; Chest: Nontender, Movement normal; Abdomen: Soft, Nontender, Nondistended, Normal bowel sounds; Genitourinary: No CVA tenderness. +foley draining cloudy yellow urine.; Extremities: Peripheral pulses normal, No tenderness, No edema, No calf edema or asymmetry.; Neuro: AA&Ox3,  Major CN grossly intact.  Speech clear. Moves all extremities on stretcher without apparent gross focal motor deficits.; Skin: Color normal, Warm, Dry.   ED Treatments / Results  Labs (all labs ordered are listed, but only abnormal results are displayed)   EKG EKG Interpretation  Date/Time:  Monday November 09 2017 16:50:36 EDT Ventricular Rate:  91 PR Interval:    QRS Duration: 100 QT Interval:  370 QTC Calculation: 463 R Axis:   19 Text Interpretation:  Poor data quality, interpretation may be adversely affected Likely  Sinus rhythm Anterior infarct, old Artifact When compared with ECG of 10/03/2017 Rate faster Confirmed by Francine Graven [id number]) on 11/09/2017 5:04:48 PM   Radiology   Procedures Procedures (including critical care time)  Medications Ordered in ED Medications  0.9 %  sodium chloride infusion (1,000 mLs Intravenous New Bag/Given 11/09/17 1730)  piperacillin-tazobactam (ZOSYN) IVPB 3.375 g (3.375 g Intravenous New Bag/Given 11/09/17 1732)  vancomycin (VANCOCIN) IVPB 1000 mg/200 mL premix (has no administration in time range)    Followed by  vancomycin (VANCOCIN) IVPB 1000 mg/200 mL premix (has no administration in time range)  piperacillin-tazobactam (ZOSYN) IVPB 3.375 g (has no administration in time range)  acetaminophen (TYLENOL) tablet 650 mg (650 mg Oral Given 11/09/17 1727)     Initial Impression / Assessment and Plan / ED Course  I have reviewed the triage vital signs and the nursing notes.  Pertinent labs & imaging results that were available during my care of the patient were reviewed by me and considered in my medical decision making (see chart for details).  MDM Reviewed: previous chart, nursing note and vitals Reviewed previous: labs and ECG Interpretation: labs, ECG and x-ray Total time providing critical care: 30-74 minutes. This excludes time spent performing separately reportable procedures and services. Consults: admitting MD   CRITICAL  CARE Performed by: Alfonzo Feller Total critical care time: 35 minutes Critical care time was exclusive of separately billable procedures and treating other patients. Critical care was necessary to treat or prevent imminent or life-threatening deterioration. Critical care was time spent personally by me on the following activities: development of treatment plan with patient and/or surrogate as well as nursing, discussions with consultants, evaluation of patient's response to treatment, examination of patient, obtaining history from patient or surrogate, ordering and performing treatments and interventions, ordering and review of laboratory studies, ordering and review of radiographic studies, pulse oximetry and re-evaluation of patient's condition.   Results for orders placed or performed during the hospital encounter of 11/09/17  Comprehensive metabolic panel  Result Value Ref Range   Sodium 141 135 - 145 mmol/L   Potassium 4.0 3.5 - 5.1 mmol/L   Chloride 106 101 - 111 mmol/L   CO2 25 22 - 32 mmol/L   Glucose, Bld 141 (H) 65 - 99 mg/dL   BUN 40 (H) 6 - 20 mg/dL   Creatinine, Ser 1.54 (H) 0.61 - 1.24 mg/dL   Calcium 8.9 8.9 - 10.3 mg/dL   Total Protein 6.7 6.5 - 8.1 g/dL   Albumin 3.1 (L) 3.5 - 5.0 g/dL   AST 36 15 - 41 U/L   ALT 23 17 - 63 U/L   Alkaline Phosphatase 102 38 - 126 U/L  Total Bilirubin 0.8 0.3 - 1.2 mg/dL   GFR calc non Af Amer 48 (L) >60 mL/min   GFR calc Af Amer 56 (L) >60 mL/min   Anion gap 10 5 - 15  CBC with Differential  Result Value Ref Range   WBC 10.2 4.0 - 10.5 K/uL   RBC 3.23 (L) 4.22 - 5.81 MIL/uL   Hemoglobin 9.7 (L) 13.0 - 17.0 g/dL   HCT 31.1 (L) 39.0 - 52.0 %   MCV 96.3 78.0 - 100.0 fL   MCH 30.0 26.0 - 34.0 pg   MCHC 31.2 30.0 - 36.0 g/dL   RDW 17.8 (H) 11.5 - 15.5 %   Platelets 187 150 - 400 K/uL   Neutrophils Relative % 71 %   Neutro Abs 7.3 1.7 - 7.7 K/uL   Lymphocytes Relative 19 %   Lymphs Abs 2.0 0.7 - 4.0 K/uL   Monocytes Relative  8 %   Monocytes Absolute 0.8 0.1 - 1.0 K/uL   Eosinophils Relative 2 %   Eosinophils Absolute 0.2 0.0 - 0.7 K/uL   Basophils Relative 0 %   Basophils Absolute 0.0 0.0 - 0.1 K/uL  Protime-INR  Result Value Ref Range   Prothrombin Time 24.1 (H) 11.4 - 15.2 seconds   INR 2.18   Urinalysis, Routine w reflex microscopic  Result Value Ref Range   Color, Urine YELLOW YELLOW   APPearance CLOUDY (A) CLEAR   Specific Gravity, Urine 1.009 1.005 - 1.030   pH 5.0 5.0 - 8.0   Glucose, UA NEGATIVE NEGATIVE mg/dL   Hgb urine dipstick LARGE (A) NEGATIVE   Bilirubin Urine NEGATIVE NEGATIVE   Ketones, ur NEGATIVE NEGATIVE mg/dL   Protein, ur 100 (A) NEGATIVE mg/dL   Nitrite NEGATIVE NEGATIVE   Leukocytes, UA MODERATE (A) NEGATIVE   RBC / HPF >50 (H) 0 - 5 RBC/hpf   WBC, UA >50 (H) 0 - 5 WBC/hpf   Bacteria, UA MANY (A) NONE SEEN   WBC Clumps PRESENT    Hyaline Casts, UA PRESENT   Lactic acid, plasma  Result Value Ref Range   Lactic Acid, Venous 1.4 0.5 - 1.9 mmol/L  Troponin I  Result Value Ref Range   Troponin I 0.03 (HH) <0.03 ng/mL   *Note: Due to a large number of results and/or encounters for the requested time period, some results have not been displayed. A complete set of results can be found in Results Review.   Dg Chest Portable 1 View Result Date: 11/09/2017 CLINICAL DATA:  Fever.  Sepsis. EXAM: PORTABLE CHEST 1 VIEW COMPARISON:  10/05/2017 and CT on 07/30/2017 FINDINGS: Heart size is stable. A large left diaphragmatic hernia is again seen which contains bowel. Interval decrease in bilateral interstitial and airspace disease is seen. No new or worsening areas of pulmonary opacity are identified. IMPRESSION: Decreased bilateral interstitial and airspace disease. Large left diaphragmatic hernia containing bowel. Electronically Signed   By: Earle Gell M.D.   On: 11/09/2017 17:31    1850:  Code sepsis called on pt's arrival. IV abx given after BC and UC obtained. Multiple BP readings in  error due to body habitus and BP cuff issues. BP is otherwise stable (no hypotension). APAP given for fever. Pt remains awake/alert, resps easy, abd soft/NT.  BUN/Cr and H/H per baseline. Dx and testing d/w pt and family.  Questions answered.  Verb understanding, agreeable to admit.  T/C returned from [facility]. Shanon Brow, case discussed, including:  HPI, pertinent PM/SHx, VS/PE, dx testing, ED course  and treatment:  Agreeable to admit.                  Final Clinical Impressions(s) / ED Diagnoses   Final diagnoses:  None    ED Discharge Orders    None       Francine Graven, DO 11/10/17 1758

## 2017-11-09 NOTE — ED Notes (Signed)
Multiple BP attempt changing position and cuff.

## 2017-11-09 NOTE — Telephone Encounter (Signed)
Patient's occupational therapist called on his behalf. He has been discharged from [facility] (PICC out), his catheter came out yesterday and his wife replaced it.  Today patient has fever 102.4 as measured by occupational therapist. His urine output is cloudy with sediment and some hematuria. RN advised patient needs to be seen at ED. Patient will call for EMS, as he cannot ambulate well. RN advised he should try to go to [facility] so he can follow up with his urologist. He will have to go to [facility] first per EMS. Landis Gandy, RN

## 2017-11-09 NOTE — Telephone Encounter (Signed)
Patient's wife called back stating patient is headed to [facility] hospital. Pricilla Riffle RN

## 2017-11-09 NOTE — Progress Notes (Signed)
Pharmacy Antibiotic Note  [name] is a 57 y.o. male admitted on 11/09/2017 with sepsis.  Pharmacy has been consulted for Zosyn dosing.  Plan: Zosyn 3.375g IV q8h (4 hour infusion).  Height: 6\' 2"  (188 cm) Weight: (!) 336 lb 10.3 oz (152.7 kg) IBW/kg (Calculated) : 82.2  Temp (24hrs), Avg:101 F (38.3 C), Min:99.2 F (37.3 C), Max:103.4 F (39.7 C)  Recent Labs  Lab 11/09/17 1713  WBC 10.2  CREATININE 1.54*  LATICACIDVEN 1.4    Estimated Creatinine Clearance: 81.6 mL/min (A) (by C-G formula based on SCr of 1.54 mg/dL (H)).    Allergies  Allergen Reactions  . Beef-Derived Products Diarrhea  . Pork-Derived Products Diarrhea    Antimicrobials this admission: Vanco 6/10 >> 6/10 Zosyn 6/10 >>   Dose adjustments this admission: N/A  Microbiology results: 6/10 BCx: pending 6/10 UCx: pending    Thank you for allowing pharmacy to be a part of this patient's care.  Ramond Craver 11/09/2017 8:12 PM

## 2017-11-09 NOTE — H&P (Signed)
History and Physical    AKSHAT MINEHART WIO:[id number] DOB: [birth year] DOA: 11/09/2017  PCP: [name], MD  Patient coming from:  home  Chief Complaint:  Fever, chills, uti  HPI: DEREC MOZINGO is a 57 y.o. male with medical history significant of a flutter, multiple myeloma, chronic kidney disease, diabetes, morbid obesity, chronic indwelling Foley catheter due to recent kidney stone comes in with 1 day of high fevers chills at home.  He is noticed for the last couple days his urine in his Foley cath is been extremely cloudy.  He denies any nausea vomiting or diarrhea.  Patient recently was in rehab center and was kicked out after 2 weeks because his insurance would not pay for more than 2 weeks of rehab.  He is getting physical therapy at home twice a week at this time.  Family is not happy because he is not been able to go to his doctor's appointments.  He normally sees Dr. Arnoldo Morale for his wounds.  He normally gets his wounds debrided weekly.  He normally sees oncology for his multiple myeloma which is not been able to see for almost a year.  At this time he is able to walk a couple of steps with a walker.  Patient is being referred for admission for UTI complicated secondary to Foley catheter.  He denies any pain at this time.   Review of Systems: As per HPI otherwise 10 point review of systems negative.   Past Medical History:  Diagnosis Date  . Abscess 04/2017   Scrotal  . Anemia   . Arteriosclerotic cardiovascular disease (ASCVD)    MI-2000s; stent to the proximal LAD and diagonal in 2001; stress nuclear in 2008-impaired exercise capacity, left ventricular dilatation, moderately to severely depressed EF, apical, inferior and anteroseptal scar  . Arthritis   . Atrial flutter ([place])   . 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 Cellulitis of leg    both legs  . Chronic diarrhea   . Chronic kidney disease, stage 3, mod decreased GFR (HCC)    Creatinine of 1.84 in 06/2011 and 1.5 in  07/2011  . Diabetes mellitus    Insulin  . Dysrhythmia    AFlutter  . GERD (gastroesophageal reflux disease)   . Gout   . Hyperlipidemia   . Hypertension   . Injection site reaction   . Multiple myeloma 07/01/2011  . Myocardial infarction ([place]) 2000  . Obesity   . Pedal edema    Venous insufficiency  . Sleep apnea    uses cpap  . Ulcer     Past Surgical History:  Procedure Laterality Date  . ABSCESS DRAINAGE     Scrotal  . BIOPSY  01/02/2012   Procedure: BIOPSY;  Surgeon: Rogene Houston, MD;  [facility];  Service: Endoscopy;  Laterality: N/A;  . BONE MARROW BIOPSY  05/13/11  . CARDIAC CATHETERIZATION     cardiac stent  . CARDIOVERSION N/A 10/13/2012   Procedure: CARDIOVERSION;  Surgeon: Yehuda Savannah, MD;  Location: [facility];  Service: Cardiovascular;  Laterality: N/A;  . CATARACT EXTRACTION W/PHACO Left 02/13/2014   Procedure: CATARACT EXTRACTION PHACO AND INTRAOCULAR LENS PLACEMENT ([place]);  Surgeon: Tonny Branch, MD;  Location: [facility];  Service: Ophthalmology;  Laterality: Left;  CDE:  7.67  . CATARACT EXTRACTION W/PHACO Right 03/02/2014   Procedure: CATARACT EXTRACTION PHACO AND INTRAOCULAR LENS PLACEMENT RIGHT EYE CDE=16.81;  Surgeon: Tonny Branch, MD;  Location: [facility];  Service: Ophthalmology;  Laterality: Right;  .  COLONOSCOPY  11/28/2011   Procedure: COLONOSCOPY;  Surgeon: Rogene Houston, MD;  [facility];  Service: Endoscopy;  Laterality: N/A;  930  . CYSTOSCOPY W/ URETERAL STENT PLACEMENT Right 07/31/2017   Procedure: CYSTOSCOPY WITH RETROGRADE PYELOGRAM/URETERAL STENT PLACEMENT;  Surgeon: Cleon Gustin, MD;  [facility];  Service: Urology;  Laterality: Right;  . ESOPHAGOGASTRODUODENOSCOPY  01/02/2012   Procedure: ESOPHAGOGASTRODUODENOSCOPY (EGD);  Surgeon: Rogene Houston, MD;  [facility];  Service: Endoscopy;  Laterality: N/A;  100  . ESOPHAGOGASTRODUODENOSCOPY N/A 09/20/2012   Procedure: ESOPHAGOGASTRODUODENOSCOPY (EGD);   Surgeon: Rogene Houston, MD;  [facility];  Service: Endoscopy;  Laterality: N/A;  . EUS N/A 10/07/2012   Procedure: UPPER ENDOSCOPIC ULTRASOUND (EUS) LINEAR;  Surgeon: Milus Banister, MD;  [facility];  Service: Endoscopy;  Laterality: N/A;  . INCISION AND DRAINAGE ABSCESS N/A 04/14/2017   Procedure: INCISION AND DRAINAGE ABSCESS;  Surgeon: Ceasar Mons, MD;  [facility];  Service: Urology;  Laterality: N/A;  . INCISION AND DRAINAGE ABSCESS ANAL    . IRRIGATION AND DEBRIDEMENT ABSCESS N/A 04/15/2017   Procedure: DEBRIDEMENT SCROTAL WOUND AND DRESSING CHANGE;  Surgeon: Ceasar Mons, MD;  [facility];  Service: Urology;  Laterality: N/A;  . IRRIGATION AND DEBRIDEMENT ABSCESS N/A 04/17/2017   Procedure: IRRIGATION AND DEBRIDEMENT ABSCESS;  Surgeon: Ceasar Mons, MD;  [facility];  Service: Urology;  Laterality: N/A;  RM 3  . LAPAROSCOPIC GASTRIC BANDING  2006   has been removed  . OSTECTOMY Right 04/08/2017   Procedure: OSTECTOMY RIGHT GREAT TOE;  Surgeon: Caprice Beaver, DPM;  [facility];  Service: Podiatry;  Laterality: Right;  . PORT-A-CATH REMOVAL Left 12/07/2012   Procedure: REMOVAL PORT-A-CATH;  Surgeon: Scherry Ran, MD;  [facility];  Service: General;  Laterality: Left;  . PORT-A-CATH REMOVAL N/A 10/02/2017   Procedure: MINOR REMOVAL PORT-A-CATH AT BEDSIDE;  Surgeon: Donnie Mesa, MD;  Location: [place];  Service: General;  Laterality: N/A;  . PORTACATH PLACEMENT  07/07/2011   Procedure: INSERTION PORT-A-CATH;  Surgeon: Scherry Ran, MD;  [facility];  Service: General;  Laterality: N/A;  . PORTACATH PLACEMENT N/A 12/07/2012   Procedure: INSERTION PORT-A-CATH;  Surgeon: Scherry Ran, MD;  [facility];  Service: General;  Laterality: N/A;  Attempted portacath placement on left and right side  . WOUND DEBRIDEMENT Right 04/08/2017   Procedure: EXCISION ULCERATION RIGHT GREAT TOE;   Surgeon: Caprice Beaver, DPM;  [facility];  Service: Podiatry;  Laterality: Right;  . WRIST SURGERY     Left; removal of bone fragment     reports that he quit smoking about 16 years ago. His smoking use included cigarettes and cigars. He has a 0.25 pack-year smoking history. He has never used smokeless tobacco. He reports that he does not drink alcohol or use drugs.  Allergies  Allergen Reactions  . Beef-Derived Products Diarrhea  . Pork-Derived Products Diarrhea    Family History  Problem Relation Age of Onset  . Heart disease Mother   . Cancer Mother   . Diabetes Father   . Arthritis Unknown   . Anesthesia problems Neg Hx   . Hypotension Neg Hx   . Malignant hyperthermia Neg Hx   . Pseudochol deficiency Neg Hx     Prior to Admission medications   Medication Sig Start Date End Date Taking? Authorizing Provider  acetaminophen (TYLENOL) 500 MG tablet Take 500-1,000 mg by  mouth every 6 (six) hours as needed for mild pain or moderate pain.    [provider]  acyclovir (ZOVIRAX) 400 MG tablet Take 1 tablet (400 mg total) by mouth every morning. 02/18/17   Twana First, MD  allopurinol (ZYLOPRIM) 300 MG tablet TAKE ONE TABLET BY MOUTH ONCE DAILY. 09/15/17   Holley Bouche, NP  Artificial Tear Ointment (DRY EYES OP) Place 1 drop into both eyes as needed (dry eyes).    [provider]  aspirin EC 81 MG tablet Take 81 mg by mouth daily.    [provider]  atorvastatin (LIPITOR) 80 MG tablet TAKE ONE TABLET BY MOUTH AT BEDTIME. 12/18/16   Herminio Commons, MD  calcitRIOL (ROCALTROL) 0.25 MCG capsule Take 0.25 mcg by mouth 3 (three) times a week. Monday, Wednesday, Friday. 12/14/13   [provider]  Calcium Carbonate (CALCIUM 600 PO) Take 1 tablet by mouth 2 (two) times daily.    [provider]  chlorproMAZINE (THORAZINE) 25 MG tablet Take 1 tablet (25 mg total) by mouth 4 (four) times daily as needed for hiccoughs. 10/08/17    Rai, Vernelle Emerald, MD  dexlansoprazole (DEXILANT) 60 MG capsule Take 60 mg by mouth every morning.    [provider]  dicyclomine (BENTYL) 20 MG tablet TAKE 1 TABLET BY MOUTH BEFORE MEALS 3 TIMES DAILY. 09/11/17   Rehman, Mechele Dawley, MD  Eluxadoline (VIBERZI) 100 MG TABS Take 100 mg 2 (two) times daily with a meal by mouth.     [provider]  gabapentin (NEURONTIN) 300 MG capsule Take 1 capsule (300 mg total) by mouth 2 (two) times daily. 04/22/17   Janece Canterbury, MD  insulin detemir (LEVEMIR) 100 UNIT/ML injection Inject 0.1 mLs (10 Units total) into the skin every morning. 04/22/17   Janece Canterbury, MD  insulin lispro (HUMALOG) 100 UNIT/ML injection Inject 0-0.1 mLs (0-10 Units total) into the skin 2 (two) times daily with a meal. Sliding Scale per patient  CBG 150-200: 1 units CBG 201-275:  3 units CBG 275-350: 5 units 351-425: 7 units 425-500: 9 units 500+: 10 units 04/22/17   Short, Noah Delaine, MD  loratadine (CLARITIN) 10 MG tablet Take 10 mg by mouth every morning.     [provider]  Magnesium Cl-Calcium Carbonate (SLOW-MAG PO) Take 1 tablet by mouth every morning.    [provider]  methocarbamol (ROBAXIN) 500 MG tablet Take 1 tablet (500 mg total) by mouth every 8 (eight) hours as needed for muscle spasms. 08/04/17   Lavina Hamman, MD  Multiple Vitamins-Minerals (MULTIVITAMINS THER. W/MINERALS) TABS Take 1 tablet by mouth daily.     [provider]  nitroGLYCERIN (NITROSTAT) 0.4 MG SL tablet DISSOLVE 1 TABLET UNDER TONGUE EVERY 5 MINUTES UP TO 15 MIN FOR CHESTPAIN. IF NO RELIEF CALL 911. 05/11/17   Holley Bouche, NP  ondansetron (ZOFRAN ODT) 4 MG disintegrating tablet Take 1 tablet (4 mg total) by mouth every 8 (eight) hours as needed for nausea or vomiting. 10/08/17   Rai, Ripudeep K, MD  potassium chloride SA (K-DUR,KLOR-CON) 20 MEQ tablet Take 1 tablet (20 mEq total) by mouth daily. 08/05/17   Lavina Hamman, MD  torsemide (DEMADEX)  20 MG tablet Take 1 tablet (20 mg total) by mouth 2 (two) times daily. Take extra 1 tab for weight gain of 3 lbs in 1 day or 5 lbs in 2 days 10/08/17   Rai, Vernelle Emerald, MD  Vitamin D, Ergocalciferol, (DRISDOL) 50000 units  CAPS capsule TAKE 1 CAPSULE BY MOUTH EVERY THIRTY DAYS. 05/11/17   Holley Bouche, NP  XARELTO 20 MG TABS tablet TAKE 1 TABLET BY MOUTH DAILY. 09/21/17   Herminio Commons, MD    Physical Exam: Vitals:   11/09/17 1801 11/09/17 1815 11/09/17 1830 11/09/17 1845  BP: (!) 150/73 (!) 143/55 (!) 138/124 (!) 155/133  Pulse: 92 94 92 81  Resp: 19 (!) 27 (!) 37 (!) 24  Temp:      TempSrc:      SpO2: 96% 93% 91% 94%  Weight:          Constitutional: NAD, calm, comfortable morbidly obese Vitals:   11/09/17 1801 11/09/17 1815 11/09/17 1830 11/09/17 1845  BP: (!) 150/73 (!) 143/55 (!) 138/124 (!) 155/133  Pulse: 92 94 92 81  Resp: 19 (!) 27 (!) 37 (!) 24  Temp:      TempSrc:      SpO2: 96% 93% 91% 94%  Weight:       Eyes: PERRL, lids and conjunctivae normal ENMT: Mucous membranes are moist. Posterior pharynx clear of any exudate or lesions.Normal dentition.  Neck: normal, supple, no masses, no thyromegaly Respiratory: clear to auscultation bilaterally, no wheezing, no crackles. Normal respiratory effort. No accessory muscle use.  Cardiovascular: Regular rate and rhythm, no murmurs / rubs / gallops. No extremity edema. 2+ pedal pulses. No carotid bruits.  Abdomen: no tenderness, no masses palpated. No hepatosplenomegaly. Bowel sounds positive.  Musculoskeletal: no clubbing / cyanosis. No joint deformity upper and lower extremities. Good ROM, no contractures. Normal muscle tone.  Skin: no rashes, lesions, ulcers to foot. No induration Neurologic: CN 2-12 grossly intact. Sensation intact, DTR normal. Strength 5/5 in all 4.  Psychiatric: Normal judgment and insight. Alert and oriented x 3. Normal mood.    Labs on Admission: I have personally reviewed following labs and  imaging studies  CBC: Recent Labs  Lab 11/09/17 1713  WBC 10.2  NEUTROABS 7.3  HGB 9.7*  HCT 31.1*  MCV 96.3  PLT 341   Basic Metabolic Panel: Recent Labs  Lab 11/09/17 1713  NA 141  K 4.0  CL 106  CO2 25  GLUCOSE 141*  BUN 40*  CREATININE 1.54*  CALCIUM 8.9   GFR: Estimated Creatinine Clearance: 90.1 mL/min (A) (by C-G formula based on SCr of 1.54 mg/dL (H)). Liver Function Tests: Recent Labs  Lab 11/09/17 1713  AST 36  ALT 23  ALKPHOS 102  BILITOT 0.8  PROT 6.7  ALBUMIN 3.1*   No results for input(s): LIPASE, AMYLASE in the last 168 hours. No results for input(s): AMMONIA in the last 168 hours. Coagulation Profile: Recent Labs  Lab 11/09/17 1713  INR 2.18   Cardiac Enzymes: Recent Labs  Lab 11/09/17 1702  TROPONINI 0.03*   BNP (last 3 results) No results for input(s): PROBNP in the last 8760 hours. HbA1C: No results for input(s): HGBA1C in the last 72 hours. CBG: No results for input(s): GLUCAP in the last 168 hours. Lipid Profile: No results for input(s): CHOL, HDL, LDLCALC, TRIG, CHOLHDL, LDLDIRECT in the last 72 hours. Thyroid Function Tests: No results for input(s): TSH, T4TOTAL, FREET4, T3FREE, THYROIDAB in the last 72 hours. Anemia Panel: No results for input(s): VITAMINB12, FOLATE, FERRITIN, TIBC, IRON, RETICCTPCT in the last 72 hours. Urine analysis:    Component Value Date/Time   COLORURINE YELLOW 11/09/2017 1657   APPEARANCEUR CLOUDY (A) 11/09/2017 1657   LABSPEC 1.009 11/09/2017 1657   PHURINE 5.0 11/09/2017 1657  GLUCOSEU NEGATIVE 11/09/2017 1657   HGBUR LARGE (A) 11/09/2017 1657   BILIRUBINUR NEGATIVE 11/09/2017 1657   KETONESUR NEGATIVE 11/09/2017 1657   PROTEINUR 100 (A) 11/09/2017 1657   UROBILINOGEN 0.2 10/03/2012 1809   NITRITE NEGATIVE 11/09/2017 1657   LEUKOCYTESUR MODERATE (A) 11/09/2017 1657   Sepsis Labs: !!!!!!!!!!!!!!!!!!!!!!!!!!!!!!!!!!!!!!!!!!!! '@[email or web address]' (procalcitonin:4,lacticidven:4) ) Recent Results  (from the past 240 hour(s))  Culture, blood (Routine x 2)     Status: None (Preliminary result)   Collection Time: 11/09/17  5:13 PM  Result Value Ref Range Status   Specimen Description BLOOD LEFT WRIST  Final   Special Requests   Final    BOTTLES DRAWN AEROBIC AND ANAEROBIC Blood Culture adequate volume Performed at [facility], [address] [place], [place] [address]    Culture PENDING  Incomplete   Report Status PENDING  Incomplete  Culture, blood (Routine x 2)     Status: None (Preliminary result)   Collection Time: 11/09/17  5:13 PM  Result Value Ref Range Status   Specimen Description LEFT ANTECUBITAL  Final   Special Requests   Final    BOTTLES DRAWN AEROBIC AND ANAEROBIC Blood Culture adequate volume Performed at [facility], [address], [place], [place] [address]    Culture PENDING  Incomplete   Report Status PENDING  Incomplete     Radiological Exams on Admission: Dg Chest Portable 1 View  Result Date: 11/09/2017 CLINICAL DATA:  Fever.  Sepsis. EXAM: PORTABLE CHEST 1 VIEW COMPARISON:  10/05/2017 and CT on 07/30/2017 FINDINGS: Heart size is stable. A large left diaphragmatic hernia is again seen which contains bowel. Interval decrease in bilateral interstitial and airspace disease is seen. No new or worsening areas of pulmonary opacity are identified. IMPRESSION: Decreased bilateral interstitial and airspace disease. Large left diaphragmatic hernia containing bowel. Electronically Signed   By: Earle Gell M.D.   On: 11/09/2017 17:31    Old chart reviewed cxr reviewed no edema or infiltrate diaphragmatic hernia noted Case discussed with edp dr Elise Benne in the ED   Assessment/Plan 57 year old male with chronic indwelling Foley catheter for the last month or so with UTI Principal Problem:   UTI (urinary tract infection)-placed on IV Zosyn.  Previous microbiology data has been reviewed.  Patient not septic.  Patient has been unable to follow-up with urology about  taking out his catheter.  Arrange as outpatient.  Follow-up on urine culture data.  Active Problems:   Insulin dependent diabetes mellitus (HCC)-continue current insulin regimen   Morbid obesity (HCC)-noted obtain physical therapy evaluation   Multiple myeloma [facility]) consult oncology while he is here due to patient request   Hypertension continue home meds-   Chronic pancreatitis (HCC)-stable   Chronic systolic heart failure ([place]) continue home meds compensated at this time   CKD (chronic kidney disease)-at baseline   Will obtain case management consult to see if we can get him any more rehab.  Obtain physical therapy evaluation to see if we can get him more physical therapy at home if this is all he qualifies for.  Obtain wound care consult.  DVT prophylaxis:  xaralto Code Status:  full Family Communication: Wife and brother Disposition Plan:  Per day team Consults called:  none Admission status:  observation   DAVID,RACHAL A MD [facility]  If 7PM-7AM, please contact night-coverage [email or web address] Password [email or web address]  11/09/2017, 7:04 PM

## 2017-11-09 NOTE — ED Triage Notes (Signed)
Per EMS patient from home. Patient complains of fever of 102 at home. EMS states temp of 102. Patient states spasms and chills x 1 day. States history of chronic foley catheter use. States foley leaking with cloudy urine in drainage bag x 2 days.

## 2017-11-10 ENCOUNTER — Observation Stay (HOSPITAL_COMMUNITY): Payer: BLUE CROSS/BLUE SHIELD

## 2017-11-10 DIAGNOSIS — A419 Sepsis, unspecified organism: Secondary | ICD-10-CM

## 2017-11-10 DIAGNOSIS — N183 Chronic kidney disease, stage 3 unspecified: Secondary | ICD-10-CM

## 2017-11-10 DIAGNOSIS — I872 Venous insufficiency (chronic) (peripheral): Secondary | ICD-10-CM | POA: Diagnosis present

## 2017-11-10 DIAGNOSIS — K219 Gastro-esophageal reflux disease without esophagitis: Secondary | ICD-10-CM | POA: Diagnosis present

## 2017-11-10 DIAGNOSIS — T83511A Infection and inflammatory reaction due to indwelling urethral catheter, initial encounter: Secondary | ICD-10-CM

## 2017-11-10 DIAGNOSIS — M1990 Unspecified osteoarthritis, unspecified site: Secondary | ICD-10-CM | POA: Diagnosis present

## 2017-11-10 DIAGNOSIS — N390 Urinary tract infection, site not specified: Secondary | ICD-10-CM

## 2017-11-10 DIAGNOSIS — E785 Hyperlipidemia, unspecified: Secondary | ICD-10-CM | POA: Diagnosis present

## 2017-11-10 DIAGNOSIS — G4730 Sleep apnea, unspecified: Secondary | ICD-10-CM | POA: Diagnosis present

## 2017-11-10 DIAGNOSIS — N10 Acute tubulo-interstitial nephritis: Secondary | ICD-10-CM | POA: Diagnosis not present

## 2017-11-10 DIAGNOSIS — C9001 Multiple myeloma in remission: Secondary | ICD-10-CM

## 2017-11-10 DIAGNOSIS — N200 Calculus of kidney: Secondary | ICD-10-CM | POA: Diagnosis present

## 2017-11-10 DIAGNOSIS — E11621 Type 2 diabetes mellitus with foot ulcer: Secondary | ICD-10-CM | POA: Diagnosis present

## 2017-11-10 DIAGNOSIS — R1031 Right lower quadrant pain: Secondary | ICD-10-CM | POA: Diagnosis not present

## 2017-11-10 DIAGNOSIS — Z9689 Presence of other specified functional implants: Secondary | ICD-10-CM | POA: Diagnosis not present

## 2017-11-10 DIAGNOSIS — N12 Tubulo-interstitial nephritis, not specified as acute or chronic: Secondary | ICD-10-CM | POA: Diagnosis present

## 2017-11-10 DIAGNOSIS — T83518A Infection and inflammatory reaction due to other urinary catheter, initial encounter: Secondary | ICD-10-CM | POA: Diagnosis present

## 2017-11-10 DIAGNOSIS — M109 Gout, unspecified: Secondary | ICD-10-CM | POA: Diagnosis present

## 2017-11-10 DIAGNOSIS — I5022 Chronic systolic (congestive) heart failure: Secondary | ICD-10-CM

## 2017-11-10 DIAGNOSIS — E1122 Type 2 diabetes mellitus with diabetic chronic kidney disease: Secondary | ICD-10-CM | POA: Diagnosis present

## 2017-11-10 DIAGNOSIS — I2510 Atherosclerotic heart disease of native coronary artery without angina pectoris: Secondary | ICD-10-CM | POA: Diagnosis present

## 2017-11-10 DIAGNOSIS — K861 Other chronic pancreatitis: Secondary | ICD-10-CM | POA: Diagnosis present

## 2017-11-10 DIAGNOSIS — A4159 Other Gram-negative sepsis: Secondary | ICD-10-CM | POA: Diagnosis present

## 2017-11-10 DIAGNOSIS — I4892 Unspecified atrial flutter: Secondary | ICD-10-CM | POA: Diagnosis present

## 2017-11-10 DIAGNOSIS — N3000 Acute cystitis without hematuria: Secondary | ICD-10-CM | POA: Diagnosis not present

## 2017-11-10 DIAGNOSIS — K529 Noninfective gastroenteritis and colitis, unspecified: Secondary | ICD-10-CM | POA: Diagnosis present

## 2017-11-10 DIAGNOSIS — Z6841 Body Mass Index (BMI) 40.0 and over, adult: Secondary | ICD-10-CM | POA: Diagnosis not present

## 2017-11-10 DIAGNOSIS — I480 Paroxysmal atrial fibrillation: Secondary | ICD-10-CM | POA: Diagnosis present

## 2017-11-10 DIAGNOSIS — L97519 Non-pressure chronic ulcer of other part of right foot with unspecified severity: Secondary | ICD-10-CM | POA: Diagnosis present

## 2017-11-10 DIAGNOSIS — I130 Hypertensive heart and chronic kidney disease with heart failure and stage 1 through stage 4 chronic kidney disease, or unspecified chronic kidney disease: Secondary | ICD-10-CM | POA: Diagnosis present

## 2017-11-10 DIAGNOSIS — R509 Fever, unspecified: Secondary | ICD-10-CM | POA: Diagnosis present

## 2017-11-10 DIAGNOSIS — Y846 Urinary catheterization as the cause of abnormal reaction of the patient, or of later complication, without mention of misadventure at the time of the procedure: Secondary | ICD-10-CM | POA: Diagnosis present

## 2017-11-10 LAB — HEMOGLOBIN A1C
Hgb A1c MFr Bld: 6.3 % — ABNORMAL HIGH (ref 4.8–5.6)
Mean Plasma Glucose: 134.11 mg/dL

## 2017-11-10 LAB — CBC
HCT: 26.5 % — ABNORMAL LOW (ref 39.0–52.0)
Hemoglobin: 8.5 g/dL — ABNORMAL LOW (ref 13.0–17.0)
MCH: 30.6 pg (ref 26.0–34.0)
MCHC: 32.1 g/dL (ref 30.0–36.0)
MCV: 95.3 fL (ref 78.0–100.0)
Platelets: 144 10*3/uL — ABNORMAL LOW (ref 150–400)
RBC: 2.78 MIL/uL — ABNORMAL LOW (ref 4.22–5.81)
RDW: 17.9 % — ABNORMAL HIGH (ref 11.5–15.5)
WBC: 8 10*3/uL (ref 4.0–10.5)

## 2017-11-10 LAB — BASIC METABOLIC PANEL
Anion gap: 8 (ref 5–15)
BUN: 40 mg/dL — ABNORMAL HIGH (ref 6–20)
CO2: 24 mmol/L (ref 22–32)
Calcium: 7.9 mg/dL — ABNORMAL LOW (ref 8.9–10.3)
Chloride: 104 mmol/L (ref 101–111)
Creatinine, Ser: 1.79 mg/dL — ABNORMAL HIGH (ref 0.61–1.24)
GFR calc Af Amer: 46 mL/min — ABNORMAL LOW (ref >60)
GFR calc non Af Amer: 40 mL/min — ABNORMAL LOW (ref >60)
Glucose, Bld: 116 mg/dL — ABNORMAL HIGH (ref 65–99)
Potassium: 4 mmol/L (ref 3.5–5.1)
Sodium: 136 mmol/L (ref 135–145)

## 2017-11-10 LAB — GLUCOSE, CAPILLARY
Glucose-Capillary: 114 mg/dL — ABNORMAL HIGH (ref 65–99)
Glucose-Capillary: 136 mg/dL — ABNORMAL HIGH (ref 65–99)
Glucose-Capillary: 144 mg/dL — ABNORMAL HIGH (ref 65–99)

## 2017-11-10 LAB — PROCALCITONIN: Procalcitonin: 0.27 ng/mL

## 2017-11-10 LAB — MRSA PCR SCREENING: MRSA by PCR: POSITIVE — AB

## 2017-11-10 MED ORDER — MUPIROCIN 2 % EX OINT
1.0000 "application " | TOPICAL_OINTMENT | Freq: Two times a day (BID) | CUTANEOUS | Status: DC
  Administered 2017-11-10 – 2017-11-12 (×4): 1 via NASAL
  Filled 2017-11-10 (×2): qty 22

## 2017-11-10 MED ORDER — CHLORHEXIDINE GLUCONATE CLOTH 2 % EX PADS
6.0000 | MEDICATED_PAD | Freq: Every day | CUTANEOUS | Status: DC
  Administered 2017-11-10 – 2017-11-12 (×3): 6 via TOPICAL

## 2017-11-10 MED ORDER — IOPAMIDOL (ISOVUE-300) INJECTION 61%
30.0000 mL | Freq: Once | INTRAVENOUS | Status: AC | PRN
  Administered 2017-11-10: 30 mL via ORAL

## 2017-11-10 MED ORDER — SODIUM CHLORIDE 0.9 % IV SOLN
INTRAVENOUS | Status: AC
  Administered 2017-11-10: 18:00:00 via INTRAVENOUS

## 2017-11-10 NOTE — Clinical Social Work Note (Signed)
Received CSW consult for possible SNF placement.   In [facility] this AM, PT, Jeneen Rinks, indicated that he recommends HH PT. Met with pt this AM to discuss. Per pt, he was at a SNF for a couple weeks recently and he does not want to return to a SNF. He would like to go home with continued [facility] PT as he has been doing. Pt states that he has been working from home since November as he has been too weak to navigate the steps (5) that he needs to be able to get to the car. He states that prior to this admission, he had worked his way up to being able to do 4 steps. He is hopeful that he will be at 5 soon.   Updated RN CM. Pt states that he does not believe he will have any CSW needs for dc. Please re-consult if needs arise.

## 2017-11-10 NOTE — Evaluation (Signed)
Physical Therapy Evaluation Patient Details Name: [name] MRN: [id number] DOB: [birth year] Today's Date: 11/10/2017   History of Present Illness  [name] is a 57 y.o. male with medical history significant of a flutter, multiple myeloma, chronic kidney disease, diabetes, morbid obesity, chronic indwelling Foley catheter due to recent kidney stone comes in with 1 day of high fevers chills at home.  He is noticed for the last couple days his urine in his Foley cath is been extremely cloudy.  He denies any nausea vomiting or diarrhea.  Patient recently was in rehab center and was kicked out after 2 weeks because his insurance would not pay for more than 2 weeks of rehab.  He is getting physical therapy at home twice a week at this time.  Family is not happy because he is not been able to go to his doctor's appointments.  He normally sees [name] for his wounds.  He normally gets his wounds debrided weekly.  He normally sees oncology for his multiple myeloma which is not been able to see for almost a year.  At this time he is able to walk a couple of steps with a walker.  Patient is being referred for admission for UTI complicated secondary to Foley catheter.  He denies any pain at this time.    Clinical Impression  Patient functioning near baseline for functional mobility and gait, has most difficulty sitting up at bedside requiring assistance to pull self up, required bed raised for sit to stands from bedside, limited to a few steps during transfer to chair due to fatigue/BLE weakness and tolerated staying up in chair after therapy.  Patient will benefit from continued physical therapy in hospital and recommended venue below to increase strength, balance, endurance for safe ADLs and gait.    Follow Up Recommendations Home health PT;Supervision for mobility/OOB    Equipment Recommendations  None recommended by PT    Recommendations for Other Services       Precautions /  Restrictions Precautions Precautions: Fall Restrictions Weight Bearing Restrictions: No      Mobility  Bed Mobility Overal bed mobility: Needs Assistance Bed Mobility: Supine to Sit     Supine to sit: Min assist;Mod assist     General bed mobility comments: requires assistance to pull self up  Transfers Overall transfer level: Needs assistance Equipment used: Standard walker Transfers: Sit to/from Stand;Stand Pivot Transfers Sit to Stand: Min assist Stand pivot transfers: Min assist       General transfer comment: slow labored movement  Ambulation/Gait Ambulation/Gait assistance: Min assist Ambulation Distance (Feet): 5 Feet Assistive device: Rolling walker (2 wheeled) Gait Pattern/deviations: Decreased step length - right;Decreased step length - left;Decreased stride length Gait velocity: slow   General Gait Details: limited to 6-7 unsteady labored side steps to transfer to chair due to fatigue and BLE weakness  Stairs            Wheelchair Mobility    Modified Rankin (Stroke Patients Only)       Balance Overall balance assessment: Needs assistance Sitting-balance support: Feet supported;No upper extremity supported Sitting balance-Leahy Scale: Good     Standing balance support: Bilateral upper extremity supported;During functional activity Standing balance-Leahy Scale: Fair                               Pertinent Vitals/Pain Pain Assessment: 0-10 Pain Score: 5  Pain Location: scrotum Pain Descriptors / Indicators:  Sore;Discomfort Pain Intervention(s): Limited activity within patient's tolerance;Monitored during session    Circle expects to be discharged to:: Private residence Living Arrangements: Spouse/significant other Available Help at Discharge: Family;Personal care attendant Type of Home: House Home Access: Stairs to enter Entrance Stairs-Rails: Right;Left;Can reach both Entrance Stairs-Number of  Steps: 7  to enter on 3rd level, first 4 steps has bilateral siderails, last 3 has no rails Home Layout: Multi-level;Able to live on main level with bedroom/bathroom Home Equipment: Gilford Rile - 2 wheels;Wheelchair - manual;Hospital bed;Bedside commode;Cane - single point      Prior Function Level of Independence: Needs assistance   Gait / Transfers Assistance Needed: with [name]; household distances  ADL's / Homemaking Assistance Needed: assisted by family        Hand Dominance   Dominant Hand: Right    Extremity/Trunk Assessment   Upper Extremity Assessment Upper Extremity Assessment: Generalized weakness    Lower Extremity Assessment Lower Extremity Assessment: Generalized weakness    Cervical / Trunk Assessment Cervical / Trunk Assessment: Normal  Communication   Communication: No difficulties  Cognition Arousal/Alertness: Awake/alert Behavior During Therapy: WFL for tasks assessed/performed Overall Cognitive Status: Within Functional Limits for tasks assessed                                        General Comments      Exercises     Assessment/Plan    PT Assessment Patient needs continued PT services  PT Problem List Decreased strength;Decreased activity tolerance;Decreased balance;Decreased mobility;Pain       PT Treatment Interventions Gait training;Stair training;Functional mobility training;Therapeutic activities;Therapeutic exercise;Patient/family education    PT Goals (Current goals can be found in the Care Plan section)  Acute Rehab PT Goals Patient Stated Goal: return home with family to assist and continue HHPT PT Goal Formulation: With patient Time For Goal Achievement: 11/17/17 Potential to Achieve Goals: Good    Frequency Min 3X/week   Barriers to discharge        Co-evaluation               AM-PAC PT "6 Clicks" Daily Activity  Outcome Measure Difficulty turning over in bed (including adjusting bedclothes,  sheets and blankets)?: A Little Difficulty moving from lying on back to sitting on the side of the bed? : A Lot Difficulty sitting down on and standing up from a chair with arms (e.g., wheelchair, bedside commode, etc,.)?: A Little Help needed moving to and from a bed to chair (including a wheelchair)?: A Little Help needed walking in hospital room?: A Lot Help needed climbing 3-5 steps with a railing? : A Lot 6 Click Score: 15    End of Session   Activity Tolerance: Patient tolerated treatment well;Patient limited by fatigue Patient left: in chair;with call bell/phone within reach Nurse Communication: Mobility status;Other (comment)(RN notified that patient left up in chair) PT Visit Diagnosis: Unsteadiness on feet (R26.81);Other abnormalities of gait and mobility (R26.89);Muscle weakness (generalized) (M62.81)    Time: 8413-2440 PT Time Calculation (min) (ACUTE ONLY): 32 min   Charges:   PT Evaluation $PT Eval Moderate Complexity: 1 Mod PT Treatments $Therapeutic Activity: 23-37 mins   PT G Codes:        2:21 PM, 2017/12/05 Lonell Grandchild, MPT Physical Therapist with [facility] 336 [phone] office [phone] mobile phone

## 2017-11-10 NOTE — Progress Notes (Signed)
PROGRESS NOTE  [name] JJO:[id number] DOB: 10/18/1960 DOA: 11/09/2017 PCP: [name], MD  Brief History:  57 year old male with a history of atrial flutter/atrial fibrillation, diabetes mellitus, coronary artery disease, myeloma, hyperlipidemia, morbid obesity, Fournier's gangrene presented with 2-day history of fever up to 102.0 F at home.  The patient was recently hospitalized from 09/30/2017 through 10/08/2017 during which she was treated for MRSA and E. coli bacteremia.  The patient was discharged to skilled nursing facility to finish cefazolin and 26 days of vancomycin.  His May hospitalization was also complicated by respiratory failure requiring intubation.  The patient also had a hospitalization from 07/30/2017 through 08/04/2017 during which she was treated for Klebsiella bacteremia and an infected right ureteral stone requiring right-sided double-J placed on 07/31/2017.  The patient states that his home physical therapist checked his temperature and noted to be elevated.  As result, the patient was transferred the emergency department for further evaluation.  He denies any headache, chest pain, shortness breath, nausea, vomiting, diarrhea, coughing.  His Foley catheter was changed possibly 2 weeks prior to this admission by [facility] home [facility].  The patient states that he has been having lower abdominal pain for the last 2 weeks that has been intermittent not affected by meals.  Upon presentation, the patient was noted to have a temperature 100.4 F with WBC 10.2.  Patient was started on vancomycin and Zosyn pending culture data.  Assessment/Plan: SIRS -Presented with fever and tachypnea with pyuria -Likely urinary source -Patient is at significant risk for decompensation given his history of myeloma, morbid obesity, and uncontrolled diabetes mellitus -Continue vancomycin and Zosyn pending culture data -Judicious IV fluids -Lactic acid 1.4 -Check procalcitonin  Catheter  associated UTI -Foley catheter changed to 2 weeks prior to this admission -CT abdomen and pelvis as the patient has been having intermittent abdominal pain -Consult urology--concerned about infected ureteral stent which was placed 07/31/2017--question if needs to be exchanged or removed -Continue antibiotics as discussed  Myeloma -In remission -The patient has had 3 immunofixation's dating back to November 2017 which have been normal -Most recent immunofixation 01/02/2017 normal -will need outpatient follow-up with med onc  CKD stage III -Baseline creatinine 1.3-1.5 -Follow BMP  Paroxysmal atrial fibrillation -Rate controlled -Continue rivaroxaban -Presently sinus rhythm  Chronic systolic CHF -06/08/107 echo EF 30-35%, grade 2 DD -Daily weights -Appears clinically euvolemic -Continue home dose torsemide -add BB if BP can tolerate  Diabetes mellitus type 2 -Continue Levemir -NovoLog sliding scale -Hemoglobin A1c  History of Fournier's gangrene -Status post debridement November 2018 -Physical examination of perineum does not show any open wounds  Morbid obesity -BMI 43.22      Disposition Plan:   Home in 2-3 days  Family Communication:  No Family at bedside  Consultants:  urology  Code Status:  FULL   DVT Prophylaxis:  xarelto   Procedures: As Listed in Progress Note Above  Antibiotics: vanco 6/10>>> Zosyn 6/10>>>    Subjective: Patient denies fevers, chills, headache, chest pain, dyspnea, nausea, vomiting, diarrhea, abdominal pain, dysuria, hematuria, hematochezia, and melena.   Objective: Vitals:   11/09/17 2005 11/09/17 2007 11/10/17 0051 11/10/17 0653  BP:  (!) 110/92  116/62  Pulse:  91 82 87  Resp:  20 18 18   Temp:  99.2 F (37.3 C)  (!) 102.1 F (38.9 C)  TempSrc:  Oral  Oral  SpO2:  100% 97% 99%  Weight: (!) 152.7 kg (336 lb  10.3 oz)     Height: 6\' 2"  (1.88 m)       Intake/Output Summary (Last 24 hours) at 11/10/2017 0724 Last data  filed at 11/10/2017 0141 Gross per 24 hour  Intake 660 ml  Output -  Net 660 ml   Weight change:  Exam:   General:  Pt is alert, follows commands appropriately, not in acute distress  HEENT: No icterus, No thrush, No neck mass, [place]/AT  Cardiovascular: RRR, S1/S2, no rubs, no gallops  Respiratory: Fine bibasilar crackles but no wheezing.  Good air movement.  Abdomen: Soft/+BS, non tender, non distended, no guarding  Extremities: trace LE edema, No lymphangitis, No petechiae, No rashes, no synovitis   Data Reviewed: I have personally reviewed following labs and imaging studies Basic Metabolic Panel: Recent Labs  Lab 11/09/17 1713  NA 141  K 4.0  CL 106  CO2 25  GLUCOSE 141*  BUN 40*  CREATININE 1.54*  CALCIUM 8.9   Liver Function Tests: Recent Labs  Lab 11/09/17 1713  AST 36  ALT 23  ALKPHOS 102  BILITOT 0.8  PROT 6.7  ALBUMIN 3.1*   No results for input(s): LIPASE, AMYLASE in the last 168 hours. No results for input(s): AMMONIA in the last 168 hours. Coagulation Profile: Recent Labs  Lab 11/09/17 1713  INR 2.18   CBC: Recent Labs  Lab 11/09/17 1713  WBC 10.2  NEUTROABS 7.3  HGB 9.7*  HCT 31.1*  MCV 96.3  PLT 187   Cardiac Enzymes: Recent Labs  Lab 11/09/17 1702  TROPONINI 0.03*   BNP: Invalid input(s): POCBNP CBG: No results for input(s): GLUCAP in the last 168 hours. HbA1C: No results for input(s): HGBA1C in the last 72 hours. Urine analysis:    Component Value Date/Time   COLORURINE YELLOW 11/09/2017 1657   APPEARANCEUR CLOUDY (A) 11/09/2017 1657   LABSPEC 1.009 11/09/2017 1657   PHURINE 5.0 11/09/2017 1657   GLUCOSEU NEGATIVE 11/09/2017 1657   HGBUR LARGE (A) 11/09/2017 1657   BILIRUBINUR NEGATIVE 11/09/2017 1657   KETONESUR NEGATIVE 11/09/2017 1657   PROTEINUR 100 (A) 11/09/2017 1657   UROBILINOGEN 0.2 10/03/2012 1809   NITRITE NEGATIVE 11/09/2017 1657   LEUKOCYTESUR MODERATE (A) 11/09/2017 1657   Sepsis  Labs: @LABRCNTIP (procalcitonin:4,lacticidven:4) ) Recent Results (from the past 240 hour(s))  Culture, blood (Routine x 2)     Status: None (Preliminary result)   Collection Time: 11/09/17  5:13 PM  Result Value Ref Range Status   Specimen Description BLOOD LEFT WRIST  Final   Special Requests   Final    BOTTLES DRAWN AEROBIC AND ANAEROBIC Blood Culture adequate volume Performed at [facility], [address] [place], [place] [address]    Culture PENDING  Incomplete   Report Status PENDING  Incomplete  Culture, blood (Routine x 2)     Status: None (Preliminary result)   Collection Time: 11/09/17  5:13 PM  Result Value Ref Range Status   Specimen Description LEFT ANTECUBITAL  Final   Special Requests   Final    BOTTLES DRAWN AEROBIC AND ANAEROBIC Blood Culture adequate volume Performed at [facility], [address] [place], [place] [address]    Culture PENDING  Incomplete   Report Status PENDING  Incomplete     Scheduled Meds: . acyclovir  400 mg Oral q morning - 10a  . allopurinol  300 mg Oral Daily  . aspirin EC  81 mg Oral Daily  . atorvastatin  80 mg Oral QHS  . [START ON  11/11/2017] calcitRIOL  0.25 mcg Oral Once per day on Mon Wed Fri  . Eluxadoline  100 mg Oral BID WC  . gabapentin  300 mg Oral BID  . insulin aspart  0-10 Units Subcutaneous BID WC  . insulin detemir  10 Units Subcutaneous q morning - 10a  . loratadine  10 mg Oral q morning - 10a  . multivitamin with minerals  1 tablet Oral Daily  . potassium chloride SA  20 mEq Oral Daily  . rivaroxaban  20 mg Oral Q supper  . sodium chloride flush  3 mL Intravenous Q12H  . torsemide  20 mg Oral BID   Continuous Infusions: . sodium chloride    . piperacillin-tazobactam (ZOSYN)  IV 3.375 g (11/10/17 0141)    Procedures/Studies: Dg Chest Portable 1 View  Result Date: 11/09/2017 CLINICAL DATA:  Fever.  Sepsis. EXAM: PORTABLE CHEST 1 VIEW COMPARISON:  10/05/2017 and CT on 07/30/2017 FINDINGS: Heart size is  stable. A large left diaphragmatic hernia is again seen which contains bowel. Interval decrease in bilateral interstitial and airspace disease is seen. No new or worsening areas of pulmonary opacity are identified. IMPRESSION: Decreased bilateral interstitial and airspace disease. Large left diaphragmatic hernia containing bowel. Electronically Signed   By: Earle Gell M.D.   On: 11/09/2017 17:31    Orson Eva, DO  [facility] Pager [phone]  If 7PM-7AM, please contact night-coverage [email or web address] Password [email or web address] 11/10/2017, 7:24 AM   LOS: 0 days

## 2017-11-10 NOTE — Consult Note (Signed)
[facility] Nurse wound consult note Reason for Consult: Nonhealing neuropathic wound (now a callous). Left plantar foot.  Chronic wound lower back.  Dry dressing only at this time per Dr Arnoldo Morale.  Remote consult completed.  Wound type:Nonhealing neuropathic ulcer to left plantar foot.  Remains a callous that is pared periodically by Dr. Arnoldo Morale Pressure Injury POA: Yes Wound bed: calloused   MD requests he leave this open to air and will continue to do that.  Gangrenous lesion to scrotum, resolved.  Dry dressing to lower back lesion, resolving area that was I & D.   Drainage (amount, consistency, odor) none Periwound:chronic skin changes to lower legs.  Dressing procedure/placement/frequency: interdry Ag to scrotal area:  Measure and cut length of InterDry Ag+ to fit in skin folds that have skin breakdown Tuck InterDry  Ag+ fabric into skin folds in a single layer, allow for 2 inches of overhang from skin edges to allow for wicking to occur May remove to bathe; dry area thoroughly and then tuck into affected areas again Do not apply any creams or ointments when using InterDry Ag+ DO NOT THROW AWAY FOR 5 DAYS unless soiled with stool DO NOT [facility] product, this will inactivate the silver in the material  New sheet of Interdry Ag+ should be applied after 5 days of use if patient continues to have skin breakdown  Dry dressing to healing wound on lower back.  Change daily.  Left foot wound open to air.  Will not follow at this time.  Please re-consult if needed.  Domenic Moras RN BSN [place] Pager [phone]

## 2017-11-10 NOTE — Plan of Care (Signed)
  Problem: Acute Rehab PT Goals(only PT should resolve) Goal: Pt Will Go Supine/Side To Sit Outcome: Progressing Flowsheets (Taken 11/10/2017 1423) Pt will go Supine/Side to Sit: with supervision Goal: Patient Will Transfer Sit To/From Stand Outcome: Progressing Flowsheets (Taken 11/10/2017 1423) Patient will transfer sit to/from stand: with supervision Goal: Pt Will Transfer Bed To Chair/Chair To Bed Outcome: Progressing Flowsheets (Taken 11/10/2017 1423) Pt will Transfer Bed to Chair/Chair to Bed: with supervision Goal: Pt Will Ambulate Outcome: Progressing Flowsheets (Taken 11/10/2017 1423) Pt will Ambulate: 25 feet;with min guard assist;with rolling walker   2:23 PM, 11/10/17 Lonell Grandchild, MPT Physical Therapist with [facility] [phone] office [phone] mobile phone

## 2017-11-10 NOTE — Progress Notes (Signed)
[facility]  Patient Status: Active (receiving services up to time of hospitalization)  [facility] is providing the following services: PT and OT  If patient discharges after hours, please call [phone].   [place] 11/10/2017, 1:44 PM

## 2017-11-10 NOTE — Care Management Note (Signed)
Case Management Note  Patient Details  Name: [name] MRN: [id number] Date of Birth: 06-12-1960  Subjective/Objective: UTI, History of myeloma.  From home with wife. Living in a downstairs bedroom. Has PT and OT services. Reports he has not been able to get up 5 steps to get out of house to go to doctors appt  We discuss using EMS if needed to get to appointments, he talks about how rough the ride is with EMS. He says that if a doctor told him that is was imperative that he be seen that he would  use EMS if he had to.   Patient discussed SNF with CSW. Declines SNF. Patient prefers to be home.  He reports that his wife and family prefer for him to be home. He pays out of pocket for a sitter during the day for assistance with ADL's. Wife is with him at night . Sleeps in a hospital bed.  Has RW.            Action/Plan: DC home with resumption of home health. Urology, Oncology consults pending.    Expected Discharge Date:     11/13/2017             Expected Discharge Plan:  [place]  In-House Referral:  Clinical Social Work  Discharge planning Services  CM Consult  Post Acute Care Choice:  Home Health, Resumption of Svcs/PTA Provider Choice offered to:  Patient  DME Arranged:    DME Agency:     HH Arranged:    [place] Agency:  [facility]  Status of Service:  In process, will continue to follow  If discussed at Long Length of Stay Meetings, dates discussed:    Additional Comments:  Hina Gupta, Chauncey [place], RN 11/10/2017, 12:41 PM

## 2017-11-10 NOTE — Progress Notes (Signed)
Pt not ready to put CPAP machine on just yet will come back in a couple of hours

## 2017-11-10 NOTE — Care Management (Signed)
CM contacted for potential SNF placement.  Placed CSW consult to have options addressed with family.

## 2017-11-10 NOTE — Plan of Care (Signed)
Continue with planned regimen 

## 2017-11-11 DIAGNOSIS — Z9689 Presence of other specified functional implants: Secondary | ICD-10-CM

## 2017-11-11 DIAGNOSIS — A419 Sepsis, unspecified organism: Secondary | ICD-10-CM

## 2017-11-11 DIAGNOSIS — N200 Calculus of kidney: Secondary | ICD-10-CM

## 2017-11-11 DIAGNOSIS — R1031 Right lower quadrant pain: Secondary | ICD-10-CM

## 2017-11-11 DIAGNOSIS — C9000 Multiple myeloma not having achieved remission: Secondary | ICD-10-CM

## 2017-11-11 LAB — URINE CULTURE: Culture: >=100000 — AB

## 2017-11-11 LAB — CBC
HCT: 26.1 % — ABNORMAL LOW (ref 39.0–52.0)
Hemoglobin: 8 g/dL — ABNORMAL LOW (ref 13.0–17.0)
MCH: 30 pg (ref 26.0–34.0)
MCHC: 30.7 g/dL (ref 30.0–36.0)
MCV: 97.8 fL (ref 78.0–100.0)
Platelets: 143 10*3/uL — ABNORMAL LOW (ref 150–400)
RBC: 2.67 MIL/uL — ABNORMAL LOW (ref 4.22–5.81)
RDW: 16.9 % — ABNORMAL HIGH (ref 11.5–15.5)
WBC: 7.2 10*3/uL (ref 4.0–10.5)

## 2017-11-11 LAB — BASIC METABOLIC PANEL
Anion gap: 8 (ref 5–15)
BUN: 40 mg/dL — ABNORMAL HIGH (ref 6–20)
CO2: 27 mmol/L (ref 22–32)
Calcium: 8.1 mg/dL — ABNORMAL LOW (ref 8.9–10.3)
Chloride: 105 mmol/L (ref 101–111)
Creatinine, Ser: 1.84 mg/dL — ABNORMAL HIGH (ref 0.61–1.24)
GFR calc Af Amer: 45 mL/min — ABNORMAL LOW (ref >60)
GFR calc non Af Amer: 39 mL/min — ABNORMAL LOW (ref >60)
Glucose, Bld: 113 mg/dL — ABNORMAL HIGH (ref 65–99)
Potassium: 3.9 mmol/L (ref 3.5–5.1)
Sodium: 140 mmol/L (ref 135–145)

## 2017-11-11 LAB — GLUCOSE, CAPILLARY
Glucose-Capillary: 116 mg/dL — ABNORMAL HIGH (ref 65–99)
Glucose-Capillary: 116 mg/dL — ABNORMAL HIGH (ref 65–99)
Glucose-Capillary: 111 mg/dL — ABNORMAL HIGH (ref 65–99)
Glucose-Capillary: 142 mg/dL — ABNORMAL HIGH (ref 65–99)

## 2017-11-11 LAB — PROCALCITONIN: Procalcitonin: 0.21 ng/mL

## 2017-11-11 MED ORDER — CIPROFLOXACIN IN D5W 400 MG/200ML IV SOLN
400.0000 mg | Freq: Two times a day (BID) | INTRAVENOUS | Status: DC
  Administered 2017-11-11 – 2017-11-12 (×3): 400 mg via INTRAVENOUS
  Filled 2017-11-11 (×3): qty 200

## 2017-11-11 NOTE — Progress Notes (Signed)
PROGRESS NOTE    [name]  FGH:[id number] DOB: [birth year] DOA: 11/09/2017 PCP: [name], MD     Brief Narrative:  57 year old man admitted from home on 6/10 due to general malaise and fever up to 102 at home.  He has a history of diabetes, hyperlipidemia, multiple myeloma, morbid obesity, a flutter/A. fib and prior history of Fournier's gangrene. He has had 2 recent admission: one in March for Klebsiella bacteremia and an infected right ureteral stone requiring JJ stent, and another admission in early May for MRSA and E Coli bacteremia. His foley was exchanged 2 weeks PTA by [facility].  Assessment & Plan:   Principal Problem:   UTI (urinary tract infection) Active Problems:   Insulin dependent diabetes mellitus (HCC)   Morbid obesity (HCC)   Multiple myeloma (HCC)   Hypertension   Atrial flutter (HCC)   Chronic pancreatitis (HCC)   Chronic systolic heart failure (HCC)   CKD (chronic kidney disease)   Sepsis due to undetermined organism ([place])   UTI (urinary tract infection) due to urinary indwelling catheter (HCC)   Multiple myeloma in remission ([place])   CKD (chronic kidney disease) stage 3, GFR 30-59 ml/min (HCC)   Chronic systolic CHF (congestive heart failure) (HCC)   Sepsis due to Citronella UTI -Due to indwelling foley catheter, nephrolithiasis and indwelling urinary stent. -Based on urine cx, will change abx to cipro. -Sepsis parameters have resolved, is clinically improved. -Seen by GU today: no note yet but per patient, they have asked him to follow up in 2 weeks for stent removal and definitive stone therapy.  Multiple Myeloma -In remission, follow-up outpatient with oncology.  Chronic kidney disease stage III -Baseline creatinine between 1.3 and 1.5. -Creatinine up to 1.84 today monitor closely.  Paroxysmal atrial fibrillation -Well-controlled. -Presently in sinus rhythm. -Continue Xarelto.  Chronic systolic heart failure -Echo with ejection fraction of  30 to 35% with grade 2 diastolic dysfunction. -Appears clinically euvolemic, continue home dose torsemide.  Type 2 diabetes -Well-controlled, continue current regimen.  Morbid obesity -Noted, counseled on lifestyle modification.   DVT prophylaxis: Xarelto Code Status: Full Code Family Communication: Wife at bedside updated on plan of care and all questions answered Disposition Plan: Anticipate DC home in 24 hours  Consultants:   Urology  Procedures:   None  Antimicrobials:  Anti-infectives (From admission, onward)   Start     Dose/Rate Route Frequency Ordered Stop   11/10/17 1000  acyclovir (ZOVIRAX) 200 MG capsule 400 mg     400 mg Oral  Every morning - 10a 11/09/17 1915     11/10/17 0200  piperacillin-tazobactam (ZOSYN) IVPB 3.375 g     3.375 g 12.5 mL/hr over 240 Minutes Intravenous Every 8 hours 11/09/17 1717     11/09/17 1900  vancomycin (VANCOCIN) IVPB 1000 mg/200 mL premix  Status:  Discontinued     1,000 mg 200 mL/hr over 60 Minutes Intravenous  Once 11/09/17 1715 11/09/17 1915   11/09/17 1800  vancomycin (VANCOCIN) IVPB 1000 mg/200 mL premix     1,000 mg 200 mL/hr over 60 Minutes Intravenous  Once 11/09/17 1715 11/09/17 1914   11/09/17 1715  piperacillin-tazobactam (ZOSYN) IVPB 3.375 g     3.375 g 100 mL/hr over 30 Minutes Intravenous  Once 11/09/17 1700 11/09/17 1806   11/09/17 1715  vancomycin (VANCOCIN) IVPB 1000 mg/200 mL premix  Status:  Discontinued     1,000 mg 200 mL/hr over 60 Minutes Intravenous  Once 11/09/17 1700 11/09/17 1714  Subjective: In chair, feels much improved. Has no acute complaints, other than questioning the dressing change to his scrotal area.  Objective: Vitals:   11/10/17 1400 11/10/17 2132 11/11/17 0015 11/11/17 0500  BP: 109/65 118/77  126/82  Pulse: 91 70 85 83  Resp: '18 18 18 20  ' Temp: 99.1 F (37.3 C) 98.3 F (36.8 C)  98 F (36.7 C)  TempSrc:  Oral  Oral  SpO2: 99% 99% 95% 98%  Weight:      Height:         Intake/Output Summary (Last 24 hours) at 11/11/2017 0806 Last data filed at 11/11/2017 0500 Gross per 24 hour  Intake 1647.08 ml  Output 4200 ml  Net -2552.92 ml   Filed Weights   11/09/17 1721 11/09/17 2005  Weight: (!) 181.4 kg (400 lb) (!) 152.7 kg (336 lb 10.3 oz)    Examination:  General exam: Alert, awake, oriented x 3 Respiratory system: Clear to auscultation. Respiratory effort normal. Cardiovascular system:RRR. No murmurs, rubs, gallops. Gastrointestinal system: Abdomen is nondistended, soft and nontender. No organomegaly or masses felt. Normal bowel sounds heard. Central nervous system: Alert and oriented. No focal neurological deficits. Extremities: No C/C/E, +pedal pulses Skin: No rashes, lesions or ulcers Psychiatry: Judgement and insight appear normal. Mood & affect appropriate.     Data Reviewed: I have personally reviewed following labs and imaging studies  CBC: Recent Labs  Lab 11/09/17 1713 11/10/17 0810 11/11/17 0617  WBC 10.2 8.0 7.2  NEUTROABS 7.3  --   --   HGB 9.7* 8.5* 8.0*  HCT 31.1* 26.5* 26.1*  MCV 96.3 95.3 97.8  PLT 187 144* 546*   Basic Metabolic Panel: Recent Labs  Lab 11/09/17 1713 11/10/17 0810 11/11/17 0617  NA 141 136 140  K 4.0 4.0 3.9  CL 106 104 105  CO2 '25 24 27  ' GLUCOSE 141* 116* 113*  BUN 40* 40* 40*  CREATININE 1.54* 1.79* 1.84*  CALCIUM 8.9 7.9* 8.1*   GFR: Estimated Creatinine Clearance: 68.3 mL/min (A) (by C-G formula based on SCr of 1.84 mg/dL (H)). Liver Function Tests: Recent Labs  Lab 11/09/17 1713  AST 36  ALT 23  ALKPHOS 102  BILITOT 0.8  PROT 6.7  ALBUMIN 3.1*   No results for input(s): LIPASE, AMYLASE in the last 168 hours. No results for input(s): AMMONIA in the last 168 hours. Coagulation Profile: Recent Labs  Lab 11/09/17 1713  INR 2.18   Cardiac Enzymes: Recent Labs  Lab 11/09/17 1702  TROPONINI 0.03*   BNP (last 3 results) No results for input(s): PROBNP in the last 8760  hours. HbA1C: Recent Labs    11/10/17 0811  HGBA1C 6.3*   CBG: Recent Labs  Lab 11/10/17 0817 11/10/17 1132 11/10/17 1644 11/11/17 0755  GLUCAP 114* 136* 144* 116*   Lipid Profile: No results for input(s): CHOL, HDL, LDLCALC, TRIG, CHOLHDL, LDLDIRECT in the last 72 hours. Thyroid Function Tests: No results for input(s): TSH, T4TOTAL, FREET4, T3FREE, THYROIDAB in the last 72 hours. Anemia Panel: No results for input(s): VITAMINB12, FOLATE, FERRITIN, TIBC, IRON, RETICCTPCT in the last 72 hours. Urine analysis:    Component Value Date/Time   COLORURINE YELLOW 11/09/2017 1657   APPEARANCEUR CLOUDY (A) 11/09/2017 1657   LABSPEC 1.009 11/09/2017 1657   PHURINE 5.0 11/09/2017 1657   GLUCOSEU NEGATIVE 11/09/2017 1657   HGBUR LARGE (A) 11/09/2017 1657   BILIRUBINUR NEGATIVE 11/09/2017 1657   KETONESUR NEGATIVE 11/09/2017 1657   PROTEINUR 100 (A) 11/09/2017 1657  UROBILINOGEN 0.2 10/03/2012 1809   NITRITE NEGATIVE 11/09/2017 1657   LEUKOCYTESUR MODERATE (A) 11/09/2017 1657   Sepsis Labs: '@LABRCNTIP' (procalcitonin:4,lacticidven:4)  ) Recent Results (from the past 240 hour(s))  Urine culture     Status: Abnormal   Collection Time: 11/09/17  5:01 PM  Result Value Ref Range Status   Specimen Description   Final    URINE, RANDOM Performed at [facility], [address] [place], [place] [address]    Special Requests   Final    NONE Performed at [facility], [address], [place], [place] [address]    Culture >=100,000 COLONIES/mL CITROBACTER SPECIES (A)  Final   Report Status 11/11/2017 FINAL  Final   Organism ID, Bacteria CITROBACTER SPECIES (A)  Final      Susceptibility   Citrobacter species - MIC*    CEFAZOLIN >=64 RESISTANT Resistant     CEFTRIAXONE 16 INTERMEDIATE Intermediate     CIPROFLOXACIN <=0.25 SENSITIVE Sensitive     GENTAMICIN <=1 SENSITIVE Sensitive     IMIPENEM 2 SENSITIVE Sensitive     NITROFURANTOIN <=16 SENSITIVE Sensitive     TRIMETH/SULFA <=20  SENSITIVE Sensitive     PIP/TAZO 64 INTERMEDIATE Intermediate     * >=100,000 COLONIES/mL CITROBACTER SPECIES  Culture, blood (Routine x 2)     Status: None (Preliminary result)   Collection Time: 11/09/17  5:13 PM  Result Value Ref Range Status   Specimen Description BLOOD LEFT WRIST  Final   Special Requests   Final    BOTTLES DRAWN AEROBIC AND ANAEROBIC Blood Culture adequate volume   Culture   Final    NO GROWTH 2 DAYS Performed at [facility], [address], [place], [place] [address]    Report Status PENDING  Incomplete  Culture, blood (Routine x 2)     Status: None (Preliminary result)   Collection Time: 11/09/17  5:13 PM  Result Value Ref Range Status   Specimen Description LEFT ANTECUBITAL  Final   Special Requests   Final    BOTTLES DRAWN AEROBIC AND ANAEROBIC Blood Culture adequate volume   Culture   Final    NO GROWTH 2 DAYS Performed at [facility], [address] [place], [place] [address]    Report Status PENDING  Incomplete  MRSA PCR Screening     Status: Abnormal   Collection Time: 11/09/17  8:15 PM  Result Value Ref Range Status   MRSA by PCR POSITIVE (A) NEGATIVE Final    Comment:        The GeneXpert MRSA Assay (FDA approved for NASAL specimens only), is one component of a comprehensive MRSA colonization surveillance program. It is not intended to diagnose MRSA infection nor to guide or monitor treatment for MRSA infections. RESULT CALLED TO, READ BACK BY AND VERIFIED WITH: Hermine Messick 11/10/17 '@0746'  [id number] Performed at [facility], [address], [place], [place] [address]          Radiology Studies: Ct Abdomen Pelvis Wo Contrast  Result Date: 11/10/2017 CLINICAL DATA:  Low abdominal pain.  Evaluate for abscess. EXAM: CT ABDOMEN AND PELVIS WITHOUT CONTRAST TECHNIQUE: Multidetector CT imaging of the abdomen and pelvis was performed following the standard protocol without IV contrast. COMPARISON:  09/30/2017. FINDINGS: Lower chest: No  pleural or pericardial effusion. There is a large diaphragmatic hernia arising from the lateral aspect of the left hemidiaphragm which contains nonobstructed loops of large bowel. No acute abnormality noted. Hepatobiliary: No focal liver abnormality identified. Small stone within the gallbladder measures  5 mm. No biliary dilatation. Pancreas: The pancreas is unremarkable. Spleen: Normal appearance of the spleen. Adrenals/Urinary Tract: The adrenal glands are normal. Bilateral perinephric fat stranding is identified, similar to previous exam. A right-sided nephroureteral stent is in place. No right hydronephrosis identified. Small stone within the inferior pole collecting system of the left kidney measures 4 mm. The urinary bladder is collapsed around a Foley catheter balloon. Stomach/Bowel: Small hiatal hernia. The stomach is nondistended. The small bowel loops have a normal caliber. The appendix is visualized and appears normal. No pathologic dilatation of the colon. Intrathoracic splenic flexure due to large diaphragmatic hernia noted. Vascular/Lymphatic: Aortic atherosclerosis. No aneurysm. No abdominal or pelvic adenopathy identified. Reproductive: Prostate is unremarkable. Other: There is no free fluid or fluid collections identified within the abdomen or pelvis. Musculoskeletal: Scattered sclerotic foci are identified within the lumbar spine and bony pelvis unchanged from previous exam. IMPRESSION: 1. No acute findings and no significant change from 09/30/2017. No abscess identified as clinically questioned. 2. Large left-sided diaphragmatic hernia containing nonobstructed loops of colon. 3. Gallstone. 4.  Aortic Atherosclerosis (ICD10-I70.0). 5. Stable position of right-sided nephroureteral stent. Electronically Signed   By: Kerby Moors M.D.   On: 11/10/2017 13:36   Dg Chest Portable 1 View  Result Date: 11/09/2017 CLINICAL DATA:  Fever.  Sepsis. EXAM: PORTABLE CHEST 1 VIEW COMPARISON:  10/05/2017 and  CT on 07/30/2017 FINDINGS: Heart size is stable. A large left diaphragmatic hernia is again seen which contains bowel. Interval decrease in bilateral interstitial and airspace disease is seen. No new or worsening areas of pulmonary opacity are identified. IMPRESSION: Decreased bilateral interstitial and airspace disease. Large left diaphragmatic hernia containing bowel. Electronically Signed   By: Earle Gell M.D.   On: 11/09/2017 17:31        Scheduled Meds: . acyclovir  400 mg Oral q morning - 10a  . allopurinol  300 mg Oral Daily  . aspirin EC  81 mg Oral Daily  . atorvastatin  80 mg Oral QHS  . calcitRIOL  0.25 mcg Oral Once per day on Mon Wed Fri  . Chlorhexidine Gluconate Cloth  6 each Topical Q0600  . Eluxadoline  100 mg Oral BID WC  . gabapentin  300 mg Oral BID  . insulin aspart  0-10 Units Subcutaneous BID WC  . insulin detemir  10 Units Subcutaneous q morning - 10a  . loratadine  10 mg Oral q morning - 10a  . multivitamin with minerals  1 tablet Oral Daily  . mupirocin ointment  1 application Nasal BID  . potassium chloride SA  20 mEq Oral Daily  . rivaroxaban  20 mg Oral Q supper  . sodium chloride flush  3 mL Intravenous Q12H   Continuous Infusions: . sodium chloride    . piperacillin-tazobactam (ZOSYN)  IV Stopped (11/11/17 0601)     LOS: 1 day    Time spent: 35 minutes. Greater than 50% of this time was spent in direct contact with the patient, coordinating care and discussing relevant ongoing clinical issues, including plan for antibiotic therapy and plans revolving OP GU follow up.     Lelon Frohlich, MD [facility] Pager [phone]  If 7PM-7AM, please contact night-coverage [email or web address] Password [facility] 11/11/2017, 8:07 AM

## 2017-11-11 NOTE — Progress Notes (Signed)
Physical Therapy Treatment Patient Details Name: [name] MRN: [id number] DOB: 1960/07/28 Today's Date: 11/11/2017    History of Present Illness [name] is a 57 y.o. male with medical history significant of a flutter, multiple myeloma, chronic kidney disease, diabetes, morbid obesity, chronic indwelling Foley catheter due to recent kidney stone comes in with 1 day of high fevers chills at home.  He is noticed for the last couple days his urine in his Foley cath is been extremely cloudy.  He denies any nausea vomiting or diarrhea.  Patient recently was in rehab center and was kicked out after 2 weeks because his insurance would not pay for more than 2 weeks of rehab.  He is getting physical therapy at home twice a week at this time.  Family is not happy because he is not been able to go to his doctor's appointments.  He normally sees Dr. Arnoldo Morale for his wounds.  He normally gets his wounds debrided weekly.  He normally sees oncology for his multiple myeloma which is not been able to see for almost a year.  At this time he is able to walk a couple of steps with a walker.  Patient is being referred for admission for UTI complicated secondary to Foley catheter.  He denies any pain at this time.    PT Comments    Patient demonstrates improvement for using BUE for sitting up at bedside, increased endurance/distance for gait training without loss of balance, limited secondary to c/o fatigue and tolerated sitting up in chair with family members present after therapy - nursing staff aware.  Patient will benefit from continued physical therapy in hospital and recommended venue below to increase strength, balance, endurance for safe ADLs and gait.    Follow Up Recommendations  Home health PT;Supervision for mobility/OOB     Equipment Recommendations  None recommended by PT    Recommendations for Other Services       Precautions / Restrictions Precautions Precautions:  Fall Restrictions Weight Bearing Restrictions: No    Mobility  Bed Mobility Overal bed mobility: Needs Assistance Bed Mobility: Supine to Sit     Supine to sit: Min guard     General bed mobility comments: improvement for using BUE for sitting up  Transfers Overall transfer level: Needs assistance Equipment used: Rolling walker (2 wheeled) Transfers: Sit to/from [facility] Sit to Stand: Supervision Stand pivot transfers: Supervision       General transfer comment: slow labored movement  Ambulation/Gait Ambulation/Gait assistance: Min guard Ambulation Distance (Feet): 35 Feet Assistive device: Rolling walker (2 wheeled) Gait Pattern/deviations: Decreased step length - right;Decreased step length - left;Decreased stride length Gait velocity: slow   General Gait Details: demonstrates increased endurance/distance for gait training wth slow slightly labored cadence without loss of balance   Stairs             Wheelchair Mobility    Modified Rankin (Stroke Patients Only)       Balance Overall balance assessment: Needs assistance Sitting-balance support: Feet supported;No upper extremity supported Sitting balance-Leahy Scale: Good     Standing balance support: Bilateral upper extremity supported;During functional activity Standing balance-Leahy Scale: Fair                              Cognition Arousal/Alertness: Awake/alert Behavior During Therapy: WFL for tasks assessed/performed Overall Cognitive Status: Within Functional Limits for tasks assessed  Exercises General Exercises - Lower Extremity Long Arc Quad: Seated;AROM;Strengthening;Both;10 reps Hip Flexion/Marching: Seated;AROM;Strengthening;Both;10 reps Toe Raises: Seated;AROM;Strengthening;10 reps;Both Heel Raises: Seated;AROM;Strengthening;10 reps;Both    General Comments        Pertinent Vitals/Pain Pain  Assessment: 0-10 Pain Score: 4  Pain Location: scrotum Pain Descriptors / Indicators: Sore;Discomfort Pain Intervention(s): Limited activity within patient's tolerance;Monitored during session    Home Living                      Prior Function            PT Goals (current goals can now be found in the care plan section) Acute Rehab PT Goals Patient Stated Goal: return home with family to assist and continue HHPT PT Goal Formulation: With patient/family Time For Goal Achievement: 2017-11-24 Potential to Achieve Goals: Good Progress towards PT goals: Progressing toward goals    Frequency    Min 4X/week      PT Plan Current plan remains appropriate    Co-evaluation              AM-PAC PT "6 Clicks" Daily Activity  Outcome Measure  Difficulty turning over in bed (including adjusting bedclothes, sheets and blankets)?: A Little Difficulty moving from lying on back to sitting on the side of the bed? : A Little Difficulty sitting down on and standing up from a chair with arms (e.g., wheelchair, bedside commode, etc,.)?: A Little Help needed moving to and from a bed to chair (including a wheelchair)?: A Little Help needed walking in hospital room?: A Little Help needed climbing 3-5 steps with a railing? : A Lot 6 Click Score: 17    End of Session Equipment Utilized During Treatment: Gait belt Activity Tolerance: Patient tolerated treatment well;Patient limited by fatigue Patient left: in chair;with call bell/phone within reach;with family/visitor present Nurse Communication: Mobility status PT Visit Diagnosis: Unsteadiness on feet (R26.81);Other abnormalities of gait and mobility (R26.89);Muscle weakness (generalized) (M62.81)     Time: 0923-3007 PT Time Calculation (min) (ACUTE ONLY): 34 min  Charges:  $Therapeutic Exercise: 8-22 mins $Therapeutic Activity: 8-22 mins                    G Codes:       4:28 PM, November 24, 2017 Lonell Grandchild, MPT Physical  Therapist with [facility] [phone] office [phone] mobile phone

## 2017-11-11 NOTE — Consult Note (Signed)
Urology Consult  Referring physician: Dr. Jerilee Hoh Reason for referral: sepsis with ureteral stent  Chief Complaint: right flank pain  History of Present Illness: [name] is a 57yo with a hx of of nephrolithiasis who was admitted with sepsis from a urinary source. He was admitted with sepsis and a right ureteral calculus in March 2019 and underwent right ureteral stent placement. Since then he has been admitted mutliple times with sepsis from a urinary source. He was doing well until 4 days ago wen he started to have worsening right flank pain, nausea, and chills. He presented to the ER and was found to be septic. CT scan showed right pyelonephritis, the right stent in good position and no hydronephrosis. Currently his right flank pain has imprvoed. He also has an indwelling foley.  Past Medical History:  Diagnosis Date  . Abscess 04/2017   Scrotal  . Anemia   . Arteriosclerotic cardiovascular disease (ASCVD)    MI-2000s; stent to the proximal LAD and diagonal in 2001; stress nuclear in 2008-impaired exercise capacity, left ventricular dilatation, moderately to severely depressed EF, apical, inferior and anteroseptal scar  . Arthritis   . Atrial flutter ([place])   . 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 Cellulitis of leg    both legs  . Chronic diarrhea   . Chronic kidney disease, stage 3, mod decreased GFR (HCC)    Creatinine of 1.84 in 06/2011 and 1.5 in 07/2011  . Diabetes mellitus    Insulin  . Dysrhythmia    AFlutter  . GERD (gastroesophageal reflux disease)   . Gout   . Hyperlipidemia   . Hypertension   . Injection site reaction   . Multiple myeloma 07/01/2011  . Myocardial infarction ([place]) 2000  . Obesity   . Pedal edema    Venous insufficiency  . Sleep apnea    uses cpap  . Ulcer    Past Surgical History:  Procedure Laterality Date  . ABSCESS DRAINAGE     Scrotal  . BIOPSY  01/02/2012   Procedure: BIOPSY;  Surgeon: Rogene [place], MD;  [facility];   Service: Endoscopy;  Laterality: N/A;  . BONE MARROW BIOPSY  05/13/11  . CARDIAC CATHETERIZATION     cardiac stent  . CARDIOVERSION N/A 10/13/2012   Procedure: CARDIOVERSION;  Surgeon: Yehuda Savannah, MD;  Location: [facility];  Service: Cardiovascular;  Laterality: N/A;  . CATARACT EXTRACTION W/PHACO Left 02/13/2014   Procedure: CATARACT EXTRACTION PHACO AND INTRAOCULAR LENS PLACEMENT ([place]);  Surgeon: Tonny Branch, MD;  Location: [facility];  Service: Ophthalmology;  Laterality: Left;  CDE:  7.67  . CATARACT EXTRACTION W/PHACO Right 03/02/2014   Procedure: CATARACT EXTRACTION PHACO AND INTRAOCULAR LENS PLACEMENT RIGHT EYE CDE=16.81;  Surgeon: Tonny Branch, MD;  Location: [facility];  Service: Ophthalmology;  Laterality: Right;  . COLONOSCOPY  11/28/2011   Procedure: COLONOSCOPY;  Surgeon: Rogene [place], MD;  [facility];  Service: Endoscopy;  Laterality: N/A;  930  . CYSTOSCOPY W/ URETERAL STENT PLACEMENT Right 07/31/2017   Procedure: CYSTOSCOPY WITH RETROGRADE PYELOGRAM/URETERAL STENT PLACEMENT;  Surgeon: Cleon Gustin, MD;  [facility];  Service: Urology;  Laterality: Right;  . ESOPHAGOGASTRODUODENOSCOPY  01/02/2012   Procedure: ESOPHAGOGASTRODUODENOSCOPY (EGD);  Surgeon: Rogene [place], MD;  [facility];  Service: Endoscopy;  Laterality: N/A;  100  . ESOPHAGOGASTRODUODENOSCOPY N/A 09/20/2012   Procedure: ESOPHAGOGASTRODUODENOSCOPY (EGD);  Surgeon: Rogene [place], MD;  [facility];  Service: Endoscopy;  Laterality: N/A;  .  EUS N/A 10/07/2012   Procedure: UPPER ENDOSCOPIC ULTRASOUND (EUS) LINEAR;  Surgeon: Milus Banister, MD;  [facility];  Service: Endoscopy;  Laterality: N/A;  . INCISION AND DRAINAGE ABSCESS N/A 04/14/2017   Procedure: INCISION AND DRAINAGE ABSCESS;  Surgeon: Ceasar Mons, MD;  [facility];  Service: Urology;  Laterality: N/A;  . INCISION AND DRAINAGE ABSCESS ANAL    . IRRIGATION AND DEBRIDEMENT ABSCESS N/A  04/15/2017   Procedure: DEBRIDEMENT SCROTAL WOUND AND DRESSING CHANGE;  Surgeon: Ceasar Mons, MD;  [facility];  Service: Urology;  Laterality: N/A;  . IRRIGATION AND DEBRIDEMENT ABSCESS N/A 04/17/2017   Procedure: IRRIGATION AND DEBRIDEMENT ABSCESS;  Surgeon: Ceasar Mons, MD;  [facility];  Service: Urology;  Laterality: N/A;  RM 3  . LAPAROSCOPIC GASTRIC BANDING  2006   has been removed  . OSTECTOMY Right 04/08/2017   Procedure: OSTECTOMY RIGHT GREAT TOE;  Surgeon: Caprice Beaver, DPM;  [facility];  Service: Podiatry;  Laterality: Right;  . PORT-A-CATH REMOVAL Left 12/07/2012   Procedure: REMOVAL PORT-A-CATH;  Surgeon: Scherry Ran, MD;  [facility];  Service: General;  Laterality: Left;  . PORT-A-CATH REMOVAL N/A 10/02/2017   Procedure: MINOR REMOVAL PORT-A-CATH AT BEDSIDE;  Surgeon: Donnie Mesa, MD;  Location: [place];  Service: General;  Laterality: N/A;  . PORTACATH PLACEMENT  07/07/2011   Procedure: INSERTION PORT-A-CATH;  Surgeon: Scherry Ran, MD;  [facility];  Service: General;  Laterality: N/A;  . PORTACATH PLACEMENT N/A 12/07/2012   Procedure: INSERTION PORT-A-CATH;  Surgeon: Scherry Ran, MD;  [facility];  Service: General;  Laterality: N/A;  Attempted portacath placement on left and right side  . WOUND DEBRIDEMENT Right 04/08/2017   Procedure: EXCISION ULCERATION RIGHT GREAT TOE;  Surgeon: Caprice Beaver, DPM;  [facility];  Service: Podiatry;  Laterality: Right;  . WRIST SURGERY     Left; removal of bone fragment    Medications: I have reviewed the patient's current medications. Allergies:  Allergies  Allergen Reactions  . Beef-Derived Products Diarrhea  . Pork-Derived Products Diarrhea    Family History  Problem Relation Age of Onset  . Heart disease Mother   . Cancer Mother   . Diabetes Father   . Arthritis Unknown   . Anesthesia problems Neg Hx   . Hypotension Neg Hx   .  Malignant hyperthermia Neg Hx   . Pseudochol deficiency Neg Hx    Social History:  reports that he quit smoking about 16 years ago. His smoking use included cigarettes and cigars. He has a 0.25 pack-year smoking history. He has never used smokeless tobacco. He reports that he does not drink alcohol or use drugs.  Review of Systems  Constitutional: Positive for chills and fever.  Gastrointestinal: Positive for abdominal pain.  Genitourinary: Positive for flank pain.  All other systems reviewed and are negative.   Physical Exam:  Vital signs in last 24 hours: Temp:  [98 F (36.7 C)-98.6 F (37 C)] 98.6 F (37 C) (06/12 2134) Pulse Rate:  [73-85] 73 (06/12 2134) Resp:  [18-20] 18 (06/12 1433) BP: (117-127)/(67-82) 127/82 (06/12 2134) SpO2:  [95 %-100 %] 100 % (06/12 2134) Physical Exam  Constitutional: He is oriented to person, place, and time. He appears well-developed and well-nourished.  HENT:  Head: Normocephalic and atraumatic.  Eyes: Pupils are equal, round, and reactive to light. EOM are normal.  Neck: Normal range of motion. No thyromegaly present.  Cardiovascular: Normal  rate and regular rhythm.  Respiratory: Effort normal. No respiratory distress.  GI: Soft. He exhibits no distension.  Musculoskeletal: Normal range of motion. He exhibits no edema.  Neurological: He is alert and oriented to person, place, and time.  Skin: Skin is warm and dry.  Psychiatric: He has a normal mood and affect. His behavior is normal. Judgment and thought content normal.    Laboratory Data:  Results for orders placed or performed during the hospital encounter of 11/09/17 (from the past 72 hour(s))  Urinalysis, Routine w reflex microscopic     Status: Abnormal   Collection Time: 11/09/17  4:57 PM  Result Value Ref Range   Color, Urine YELLOW YELLOW   APPearance CLOUDY (A) CLEAR   Specific Gravity, Urine 1.009 1.005 - 1.030   pH 5.0 5.0 - 8.0   Glucose, UA NEGATIVE NEGATIVE mg/dL   Hgb  urine dipstick LARGE (A) NEGATIVE   Bilirubin Urine NEGATIVE NEGATIVE   Ketones, ur NEGATIVE NEGATIVE mg/dL   Protein, ur 100 (A) NEGATIVE mg/dL   Nitrite NEGATIVE NEGATIVE   Leukocytes, UA MODERATE (A) NEGATIVE   RBC / HPF >50 (H) 0 - 5 RBC/hpf   WBC, UA >50 (H) 0 - 5 WBC/hpf   Bacteria, UA MANY (A) NONE SEEN   WBC Clumps PRESENT    Hyaline Casts, UA PRESENT     Comment: Performed at [facility], [address] [place], [place] [address]  Urine culture     Status: Abnormal   Collection Time: 11/09/17  5:01 PM  Result Value Ref Range   Specimen Description      URINE, RANDOM Performed at [facility], [address], [place], [place] [address]    Special Requests      NONE Performed at [facility], [address], [place], [place] [address]    Culture >=100,000 COLONIES/mL CITROBACTER SPECIES (A)    Report Status 11/11/2017 FINAL    Organism ID, Bacteria CITROBACTER SPECIES (A)       Susceptibility   Citrobacter species - MIC*    CEFAZOLIN >=64 RESISTANT Resistant     CEFTRIAXONE 16 INTERMEDIATE Intermediate     CIPROFLOXACIN <=0.25 SENSITIVE Sensitive     GENTAMICIN <=1 SENSITIVE Sensitive     IMIPENEM 2 SENSITIVE Sensitive     NITROFURANTOIN <=16 SENSITIVE Sensitive     TRIMETH/SULFA <=20 SENSITIVE Sensitive     PIP/TAZO 64 INTERMEDIATE Intermediate     * >=100,000 COLONIES/mL CITROBACTER SPECIES  Troponin I     Status: Abnormal   Collection Time: 11/09/17  5:02 PM  Result Value Ref Range   Troponin I 0.03 (HH) <0.03 ng/mL    Comment: CRITICAL RESULT CALLED TO, READ BACK BY AND VERIFIED WITH: PATRAW,B ON 11/09/17 AT 1805 BY LOY,C Performed at [facility], [address] [place], [place] [address]   Comprehensive metabolic panel     Status: Abnormal   Collection Time: 11/09/17  5:13 PM  Result Value Ref Range   Sodium 141 135 - 145 mmol/L   Potassium 4.0 3.5 - 5.1 mmol/L   Chloride 106 101 - 111 mmol/L   CO2 25 22 - 32 mmol/L   Glucose, Bld 141 (H) 65 - 99 mg/dL    BUN 40 (H) 6 - 20 mg/dL   Creatinine, Ser 1.54 (H) 0.61 - 1.24 mg/dL   Calcium 8.9 8.9 - 10.3 mg/dL   Total Protein 6.7 6.5 - 8.1 g/dL   Albumin 3.1 (L) 3.5 - 5.0 g/dL   AST  36 15 - 41 U/L   ALT 23 17 - 63 U/L   Alkaline Phosphatase 102 38 - 126 U/L   Total Bilirubin 0.8 0.3 - 1.2 mg/dL   GFR calc non Af Amer 48 (L) >60 mL/min   GFR calc Af Amer 56 (L) >60 mL/min    Comment: (NOTE) The eGFR has been calculated using the CKD EPI equation. This calculation has not been validated in all clinical situations. eGFR's persistently <60 mL/min signify possible Chronic Kidney Disease.    Anion gap 10 5 - 15    Comment: Performed at [facility], [address], [place], [place] [address]  CBC with Differential     Status: Abnormal   Collection Time: 11/09/17  5:13 PM  Result Value Ref Range   WBC 10.2 4.0 - 10.5 K/uL   RBC 3.23 (L) 4.22 - 5.81 MIL/uL   Hemoglobin 9.7 (L) 13.0 - 17.0 g/dL   HCT 31.1 (L) 39.0 - 52.0 %   MCV 96.3 78.0 - 100.0 fL   MCH 30.0 26.0 - 34.0 pg   MCHC 31.2 30.0 - 36.0 g/dL   RDW 17.8 (H) 11.5 - 15.5 %   Platelets 187 150 - 400 K/uL   Neutrophils Relative % 71 %   Neutro Abs 7.3 1.7 - 7.7 K/uL   Lymphocytes Relative 19 %   Lymphs Abs 2.0 0.7 - 4.0 K/uL   Monocytes Relative 8 %   Monocytes Absolute 0.8 0.1 - 1.0 K/uL   Eosinophils Relative 2 %   Eosinophils Absolute 0.2 0.0 - 0.7 K/uL   Basophils Relative 0 %   Basophils Absolute 0.0 0.0 - 0.1 K/uL    Comment: Performed at [facility], [address], [place], [place] [address]  Protime-INR     Status: Abnormal   Collection Time: 11/09/17  5:13 PM  Result Value Ref Range   Prothrombin Time 24.1 (H) 11.4 - 15.2 seconds   INR 2.18     Comment: Performed at [facility], [address] [place], [place] [address]  Culture, blood (Routine x 2)     Status: None (Preliminary result)   Collection Time: 11/09/17  5:13 PM  Result Value Ref Range   Specimen Description BLOOD LEFT WRIST    Special Requests       BOTTLES DRAWN AEROBIC AND ANAEROBIC Blood Culture adequate volume   Culture      NO GROWTH 2 DAYS Performed at [facility], [address] [place], [place] [address]    Report Status PENDING   Culture, blood (Routine x 2)     Status: None (Preliminary result)   Collection Time: 11/09/17  5:13 PM  Result Value Ref Range   Specimen Description LEFT ANTECUBITAL    Special Requests      BOTTLES DRAWN AEROBIC AND ANAEROBIC Blood Culture adequate volume   Culture      NO GROWTH 2 DAYS Performed at [facility], [address], [place], [place] [address]    Report Status PENDING   Lactic acid, plasma     Status: None   Collection Time: 11/09/17  5:13 PM  Result Value Ref Range   Lactic Acid, Venous 1.4 0.5 - 1.9 mmol/L    Comment: Performed at [facility], [address], [place],  [address]  MRSA PCR Screening     Status: Abnormal   Collection Time: 11/09/17  8:15 PM  Result Value Ref Range   MRSA by PCR POSITIVE (A) NEGATIVE    Comment:  The GeneXpert MRSA Assay (FDA approved for NASAL specimens only), is one component of a comprehensive MRSA colonization surveillance program. It is not intended to diagnose MRSA infection nor to guide or monitor treatment for MRSA infections. RESULT CALLED TO, READ BACK BY AND VERIFIED WITH: Hermine Messick 11/10/17 _0  [id number] Performed at [facility], [address], [place], [place] [address]   Procalcitonin - Baseline     Status: None   Collection Time: 11/10/17  8:10 AM  Result Value Ref Range   Procalcitonin 0.27 ng/mL    Comment:        Interpretation: PCT (Procalcitonin) <= 0.5 ng/mL: Systemic infection (sepsis) is not likely. Local bacterial infection is possible. (NOTE)       Sepsis PCT Algorithm           Lower Respiratory Tract                                      Infection PCT Algorithm    ----------------------------     ----------------------------         PCT < 0.25 ng/mL                PCT < 0.10 ng/mL          Strongly encourage             Strongly discourage   discontinuation of antibiotics    initiation of antibiotics    ----------------------------     -----------------------------       PCT 0.25 - 0.50 ng/mL            PCT 0.10 - 0.25 ng/mL               OR       >80% decrease in PCT            Discourage initiation of                                            antibiotics      Encourage discontinuation           of antibiotics    ----------------------------     -----------------------------         PCT >= 0.50 ng/mL              PCT 0.26 - 0.50 ng/mL               AND        <80% decrease in PCT             Encourage initiation of                                             antibiotics       Encourage continuation           of antibiotics    ----------------------------     -----------------------------        PCT >= 0.50 ng/mL                  PCT > 0.50 ng/mL               AND  increase in PCT                  Strongly encourage                                      initiation of antibiotics    Strongly encourage escalation           of antibiotics                                     -----------------------------                                           PCT <= 0.25 ng/mL                                                 OR                                        > 80% decrease in PCT                                     Discontinue / Do not initiate                                             antibiotics Performed at [facility], [address] [place], [place] [address]   Basic metabolic panel     Status: Abnormal   Collection Time: 11/10/17  8:10 AM  Result Value Ref Range   Sodium 136 135 - 145 mmol/L   Potassium 4.0 3.5 - 5.1 mmol/L   Chloride 104 101 - 111 mmol/L   CO2 24 22 - 32 mmol/L   Glucose, Bld 116 (H) 65 - 99 mg/dL   BUN 40 (H) 6 - 20 mg/dL   Creatinine, Ser 1.79 (H) 0.61 - 1.24 mg/dL   Calcium 7.9 (L) 8.9 - 10.3 mg/dL   GFR calc non Af Amer 40 (L)  >60 mL/min   GFR calc Af Amer 46 (L) >60 mL/min    Comment: (NOTE) The eGFR has been calculated using the CKD EPI equation. This calculation has not been validated in all clinical situations. eGFR's persistently <60 mL/min signify possible Chronic Kidney Disease.    Anion gap 8 5 - 15    Comment: Performed at [facility], [address], [place], [place] [address]  CBC     Status: Abnormal   Collection Time: 11/10/17  8:10 AM  Result Value Ref Range   WBC 8.0 4.0 - 10.5 K/uL   RBC 2.78 (L) 4.22 - 5.81 MIL/uL   Hemoglobin 8.5 (L) 13.0 - 17.0 g/dL   HCT 26.5 (L) 39.0 - 52.0 %   MCV 95.3 78.0 - 100.0 fL   MCH 30.6 26.0 - 34.0 pg  MCHC 32.1 30.0 - 36.0 g/dL   RDW 17.9 (H) 11.5 - 15.5 %   Platelets 144 (L) 150 - 400 K/uL    Comment: Performed at [facility], [address] [place], [place] [address]  Hemoglobin A1c     Status: Abnormal   Collection Time: 11/10/17  8:11 AM  Result Value Ref Range   Hgb A1c MFr Bld 6.3 (H) 4.8 - 5.6 %    Comment: (NOTE) Pre diabetes:          5.7%-6.4% Diabetes:              >6.4% Glycemic control for   <7.0% adults with diabetes    Mean Plasma Glucose 134.11 mg/dL    Comment: Performed at [facility] [address], [place], [place] [address]  Glucose, capillary     Status: Abnormal   Collection Time: 11/10/17  8:17 AM  Result Value Ref Range   Glucose-Capillary 114 (H) 65 - 99 mg/dL  Glucose, capillary     Status: Abnormal   Collection Time: 11/10/17 11:32 AM  Result Value Ref Range   Glucose-Capillary 136 (H) 65 - 99 mg/dL  Glucose, capillary     Status: Abnormal   Collection Time: 11/10/17  4:44 PM  Result Value Ref Range   Glucose-Capillary 144 (H) 65 - 99 mg/dL  Procalcitonin     Status: None   Collection Time: 11/11/17  6:17 AM  Result Value Ref Range   Procalcitonin 0.21 ng/mL    Comment:        Interpretation: PCT (Procalcitonin) <= 0.5 ng/mL: Systemic infection (sepsis) is not likely. Local bacterial infection is  possible. (NOTE)       Sepsis PCT Algorithm           Lower Respiratory Tract                                      Infection PCT Algorithm    ----------------------------     ----------------------------         PCT < 0.25 ng/mL                PCT < 0.10 ng/mL         Strongly encourage             Strongly discourage   discontinuation of antibiotics    initiation of antibiotics    ----------------------------     -----------------------------       PCT 0.25 - 0.50 ng/mL            PCT 0.10 - 0.25 ng/mL               OR       >80% decrease in PCT            Discourage initiation of                                            antibiotics      Encourage discontinuation           of antibiotics    ----------------------------     -----------------------------         PCT >= 0.50 ng/mL              PCT 0.26 - 0.50  ng/mL               AND        <80% decrease in PCT             Encourage initiation of                                             antibiotics       Encourage continuation           of antibiotics    ----------------------------     -----------------------------        PCT >= 0.50 ng/mL                  PCT > 0.50 ng/mL               AND         increase in PCT                  Strongly encourage                                      initiation of antibiotics    Strongly encourage escalation           of antibiotics                                     -----------------------------                                           PCT <= 0.25 ng/mL                                                 OR                                        > 80% decrease in PCT                                     Discontinue / Do not initiate                                             antibiotics Performed at [facility], [address], [place],  [address]   Basic metabolic panel     Status: Abnormal   Collection Time: 11/11/17  6:17 AM  Result Value Ref Range   Sodium 140 135 - 145 mmol/L    Potassium 3.9 3.5 - 5.1 mmol/L   Chloride 105 101 - 111 mmol/L   CO2 27 22 - 32 mmol/L   Glucose, Bld 113 (H) 65 - 99 mg/dL   BUN 40 (H) 6 - 20 mg/dL  Creatinine, Ser 1.84 (H) 0.61 - 1.24 mg/dL   Calcium 8.1 (L) 8.9 - 10.3 mg/dL   GFR calc non Af Amer 39 (L) >60 mL/min   GFR calc Af Amer 45 (L) >60 mL/min    Comment: (NOTE) The eGFR has been calculated using the CKD EPI equation. This calculation has not been validated in all clinical situations. eGFR's persistently <60 mL/min signify possible Chronic Kidney Disease.    Anion gap 8 5 - 15    Comment: Performed at [facility], [address], [place], [place] [address]  CBC     Status: Abnormal   Collection Time: 11/11/17  6:17 AM  Result Value Ref Range   WBC 7.2 4.0 - 10.5 K/uL   RBC 2.67 (L) 4.22 - 5.81 MIL/uL   Hemoglobin 8.0 (L) 13.0 - 17.0 g/dL   HCT 26.1 (L) 39.0 - 52.0 %   MCV 97.8 78.0 - 100.0 fL   MCH 30.0 26.0 - 34.0 pg   MCHC 30.7 30.0 - 36.0 g/dL   RDW 16.9 (H) 11.5 - 15.5 %   Platelets 143 (L) 150 - 400 K/uL    Comment: Performed at [facility], [address] [place], [place] [address]  Glucose, capillary     Status: Abnormal   Collection Time: 11/11/17  7:55 AM  Result Value Ref Range   Glucose-Capillary 116 (H) 65 - 99 mg/dL   Comment 1 Notify RN    Comment 2 Document in Chart   Glucose, capillary     Status: Abnormal   Collection Time: 11/11/17 11:37 AM  Result Value Ref Range   Glucose-Capillary 116 (H) 65 - 99 mg/dL   Comment 1 Notify RN    Comment 2 Document in Chart   Glucose, capillary     Status: Abnormal   Collection Time: 11/11/17  4:21 PM  Result Value Ref Range   Glucose-Capillary 111 (H) 65 - 99 mg/dL   Comment 1 Notify RN    Comment 2 Document in Chart   Glucose, capillary     Status: Abnormal   Collection Time: 11/11/17  9:54 PM  Result Value Ref Range   Glucose-Capillary 142 (H) 65 - 99 mg/dL   *Note: Due to a large number of results and/or encounters for the requested time  period, some results have not been displayed. A complete set of results can be found in Results Review.   Recent Results (from the past 240 hour(s))  Urine culture     Status: Abnormal   Collection Time: 11/09/17  5:01 PM  Result Value Ref Range Status   Specimen Description   Final    URINE, RANDOM Performed at [facility], [address] [place], [place] [address]    Special Requests   Final    NONE Performed at [facility], [address], [place], [place] [address]    Culture >=100,000 COLONIES/mL CITROBACTER SPECIES (A)  Final   Report Status 11/11/2017 FINAL  Final   Organism ID, Bacteria CITROBACTER SPECIES (A)  Final      Susceptibility   Citrobacter species - MIC*    CEFAZOLIN >=64 RESISTANT Resistant     CEFTRIAXONE 16 INTERMEDIATE Intermediate     CIPROFLOXACIN <=0.25 SENSITIVE Sensitive     GENTAMICIN <=1 SENSITIVE Sensitive     IMIPENEM 2 SENSITIVE Sensitive     NITROFURANTOIN <=16 SENSITIVE Sensitive     TRIMETH/SULFA <=20 SENSITIVE Sensitive     PIP/TAZO 64 INTERMEDIATE Intermediate     * >=100,000  COLONIES/mL CITROBACTER SPECIES  Culture, blood (Routine x 2)     Status: None (Preliminary result)   Collection Time: 11/09/17  5:13 PM  Result Value Ref Range Status   Specimen Description BLOOD LEFT WRIST  Final   Special Requests   Final    BOTTLES DRAWN AEROBIC AND ANAEROBIC Blood Culture adequate volume   Culture   Final    NO GROWTH 2 DAYS Performed at [facility], [address], [place], [place] [address]    Report Status PENDING  Incomplete  Culture, blood (Routine x 2)     Status: None (Preliminary result)   Collection Time: 11/09/17  5:13 PM  Result Value Ref Range Status   Specimen Description LEFT ANTECUBITAL  Final   Special Requests   Final    BOTTLES DRAWN AEROBIC AND ANAEROBIC Blood Culture adequate volume   Culture   Final    NO GROWTH 2 DAYS Performed at [facility], [address], [place], [place] [address]    Report Status PENDING   Incomplete  MRSA PCR Screening     Status: Abnormal   Collection Time: 11/09/17  8:15 PM  Result Value Ref Range Status   MRSA by PCR POSITIVE (A) NEGATIVE Final    Comment:        The GeneXpert MRSA Assay (FDA approved for NASAL specimens only), is one component of a comprehensive MRSA colonization surveillance program. It is not intended to diagnose MRSA infection nor to guide or monitor treatment for MRSA infections. RESULT CALLED TO, READ BACK BY AND VERIFIED WITH: Hermine Messick 11/10/17 _0  [id number] Performed at [facility], [address], [place], [place] [address]    Creatinine: Recent Labs    11/09/17 1713 11/10/17 0810 11/11/17 0617  CREATININE 1.54* 1.79* 1.84*   Baseline Creatinine: 1.3  Impression/Assessment:  56yo with nephrolithiasis and sepsis from a urinary source  Plan:  1. Sepsis: management per hospitalist team. I discussed the management of his ureteral calculi and we will schedule him for right ureteroscopic stone extraction in 2 weeks after he has completed his course of antibiotics. We also disucssed removing the foley as this is likely the source of his recurrent infections. Please perform voiding trial prior to discharge  Nicolette Bang 11/11/2017, 9:56 PM

## 2017-11-11 NOTE — Progress Notes (Signed)
Pharmacy Antibiotic Note  [name] is a 57 y.o. male admitted on 11/09/2017 with sepsis and UTI.  Pharmacy has been consulted for Ciprofloxacin dosing.  Plan: Susceptibility to urine culture shows intermediate susceptibility to zosyn Change to Ciprofloxacin 400 mg IV q12 hours Monitor labs, c/s, and patient improvement.  Height: 6\' 2"  (188 cm) Weight: (!) 336 lb 10.3 oz (152.7 kg) IBW/kg (Calculated) : 82.2  Temp (24hrs), Avg:98.5 F (36.9 C), Min:98 F (36.7 C), Max:99.1 F (37.3 C)  Recent Labs  Lab 11/09/17 1713 11/10/17 0810 11/11/17 0617  WBC 10.2 8.0 7.2  CREATININE 1.54* 1.79* 1.84*  LATICACIDVEN 1.4  --   --     Estimated Creatinine Clearance: 68.3 mL/min (A) (by C-G formula based on SCr of 1.84 mg/dL (H)).    Allergies  Allergen Reactions  . Beef-Derived Products Diarrhea  . Pork-Derived Products Diarrhea    Antimicrobials this admission: Zosyn 6/11 >> 6/12 Cipro 6/12 >>   Dose adjustments this admission: N/A  Microbiology results: 6/10 BCx: ng x 2 days 6/10 UCx: 100,000 colonies/mL citrobacter (intermediate to Zosyn) 6/10 MRSA PCR: positive  Thank you for allowing pharmacy to be a part of this patient's care.  Ramond Craver 11/11/2017 10:26 AM

## 2017-11-11 NOTE — Plan of Care (Signed)
Continue set regimen

## 2017-11-12 ENCOUNTER — Other Ambulatory Visit (HOSPITAL_COMMUNITY): Payer: Self-pay | Admitting: *Deleted

## 2017-11-12 DIAGNOSIS — C9000 Multiple myeloma not having achieved remission: Secondary | ICD-10-CM

## 2017-11-12 DIAGNOSIS — N10 Acute tubulo-interstitial nephritis: Secondary | ICD-10-CM

## 2017-11-12 LAB — BASIC METABOLIC PANEL
Anion gap: 7 (ref 5–15)
BUN: 34 mg/dL — ABNORMAL HIGH (ref 6–20)
CO2: 26 mmol/L (ref 22–32)
Calcium: 8.1 mg/dL — ABNORMAL LOW (ref 8.9–10.3)
Chloride: 107 mmol/L (ref 101–111)
Creatinine, Ser: 1.62 mg/dL — ABNORMAL HIGH (ref 0.61–1.24)
GFR calc Af Amer: 52 mL/min — ABNORMAL LOW (ref >60)
GFR calc non Af Amer: 45 mL/min — ABNORMAL LOW (ref >60)
Glucose, Bld: 113 mg/dL — ABNORMAL HIGH (ref 65–99)
Potassium: 3.7 mmol/L (ref 3.5–5.1)
Sodium: 140 mmol/L (ref 135–145)

## 2017-11-12 LAB — CBC
HCT: 26.4 % — ABNORMAL LOW (ref 39.0–52.0)
Hemoglobin: 8.3 g/dL — ABNORMAL LOW (ref 13.0–17.0)
MCH: 30.2 pg (ref 26.0–34.0)
MCHC: 31.4 g/dL (ref 30.0–36.0)
MCV: 96 fL (ref 78.0–100.0)
Platelets: 166 10*3/uL (ref 150–400)
RBC: 2.75 MIL/uL — ABNORMAL LOW (ref 4.22–5.81)
RDW: 17.4 % — ABNORMAL HIGH (ref 11.5–15.5)
WBC: 5.8 10*3/uL (ref 4.0–10.5)

## 2017-11-12 LAB — IRON AND TIBC
Iron: 32 ug/dL — ABNORMAL LOW (ref 45–182)
Saturation Ratios: 12 % — ABNORMAL LOW (ref 17.9–39.5)
TIBC: 265 ug/dL (ref 250–450)
UIBC: 233 ug/dL

## 2017-11-12 LAB — GLUCOSE, CAPILLARY: Glucose-Capillary: 102 mg/dL — ABNORMAL HIGH (ref 65–99)

## 2017-11-12 LAB — FERRITIN: Ferritin: 135 ng/mL (ref 24–336)

## 2017-11-12 LAB — FOLATE: Folate: 29 ng/mL (ref >5.9)

## 2017-11-12 LAB — VITAMIN B12: Vitamin B-12: 381 pg/mL (ref 180–914)

## 2017-11-12 LAB — PROCALCITONIN: Procalcitonin: 0.11 ng/mL

## 2017-11-12 MED ORDER — CIPROFLOXACIN HCL 500 MG PO TABS: 500.0000 mg | ORAL_TABLET | Freq: Two times a day (BID) | ORAL | 0 refills | Status: AC

## 2017-11-12 NOTE — Progress Notes (Signed)
Physical Therapy Treatment Patient Details Name: RUFUS BESKE MRN: [id number] DOB: 13-Jun-1960 Today's Date: 11/12/2017    History of Present Illness HARLON KUTNER is a 57 y.o. male with medical history significant of a flutter, multiple myeloma, chronic kidney disease, diabetes, morbid obesity, chronic indwelling Foley catheter due to recent kidney stone comes in with 1 day of high fevers chills at home.  He is noticed for the last couple days his urine in his Foley cath is been extremely cloudy.  He denies any nausea vomiting or diarrhea.  Patient recently was in rehab center and was kicked out after 2 weeks because his insurance would not pay for more than 2 weeks of rehab.  He is getting physical therapy at home twice a week at this time.  Family is not happy because he is not been able to go to his doctor's appointments.  He normally sees Dr. Arnoldo Morale for his wounds.  He normally gets his wounds debrided weekly.  He normally sees oncology for his multiple myeloma which is not been able to see for almost a year.  At this time he is able to walk a couple of steps with a walker.  Patient is being referred for admission for UTI complicated secondary to Foley catheter.  He denies any pain at this time.    PT Comments    Patient demonstrates labored movement for sitting up at bedside with head of bed slightly raised, increased endurance/distance for gait training without loss of balance, followed with w/c for safety, but did not have to take any sitting rest breaks during ambulation and tolerated sitting up in chair after therapy.  Patient will benefit from continued physical therapy in hospital and recommended venue below to increase strength, balance, endurance for safe ADLs and gait.    Follow Up Recommendations  Home health PT;Supervision for mobility/OOB     Equipment Recommendations  None recommended by PT    Recommendations for Other Services       Precautions / Restrictions  Precautions Precautions: Fall Restrictions Weight Bearing Restrictions: No    Mobility  Bed Mobility Overal bed mobility: Needs Assistance Bed Mobility: Supine to Sit     Supine to sit: Min guard     General bed mobility comments: demonstrates slightly labored movement  Transfers Overall transfer level: Needs assistance Equipment used: Rolling walker (2 wheeled) Transfers: Sit to/from [facility] Sit to Stand: Supervision Stand pivot transfers: Supervision       General transfer comment: slow   Ambulation/Gait Ambulation/Gait assistance: Supervision Gait Distance (Feet): 80 Feet Assistive device: Rolling walker (2 wheeled) Gait Pattern/deviations: Decreased step length - right;Decreased step length - left;Decreased stride length Gait velocity: slow   General Gait Details: demonstrates increased endurance/distance for gait training wth slow slightly labored cadence without loss of balance, followed with wheelchair for safety   Stairs             Wheelchair Mobility    Modified Rankin (Stroke Patients Only)       Balance Overall balance assessment: Needs assistance Sitting-balance support: Feet supported;No upper extremity supported Sitting balance-Leahy Scale: Good     Standing balance support: Bilateral upper extremity supported;During functional activity Standing balance-Leahy Scale: Fair                              Cognition Arousal/Alertness: Awake/alert Behavior During Therapy: WFL for tasks assessed/performed Overall Cognitive Status: Within Functional Limits for tasks  assessed                                        Exercises General Exercises - Lower Extremity Long Arc Quad: Seated;AROM;Strengthening;Both;10 reps Hip Flexion/Marching: Seated;AROM;Strengthening;Both;10 reps Toe Raises: Seated;AROM;Strengthening;10 reps;Both Heel Raises: Seated;AROM;Strengthening;10 reps;Both    General  Comments        Pertinent Vitals/Pain Pain Assessment: 0-10 Pain Score: 5  Pain Location: scrotum and bilateral hands Pain Descriptors / Indicators: Sore;Discomfort Pain Intervention(s): Limited activity within patient's tolerance;Monitored during session    Home Living                      Prior Function            PT Goals (current goals can now be found in the care plan section) Acute Rehab PT Goals Patient Stated Goal: return home with family to assist and continue HHPT PT Goal Formulation: With patient/family Time For Goal Achievement: 11/13/17 Potential to Achieve Goals: Good Progress towards PT goals: Progressing toward goals    Frequency    Min 4X/week      PT Plan Current plan remains appropriate    Co-evaluation              AM-PAC PT "6 Clicks" Daily Activity  Outcome Measure  Difficulty turning over in bed (including adjusting bedclothes, sheets and blankets)?: None Difficulty moving from lying on back to sitting on the side of the bed? : A Little Difficulty sitting down on and standing up from a chair with arms (e.g., wheelchair, bedside commode, etc,.)?: A Little Help needed moving to and from a bed to chair (including a wheelchair)?: A Little Help needed walking in hospital room?: A Little Help needed climbing 3-5 steps with a railing? : A Lot 6 Click Score: 18    End of Session Equipment Utilized During Treatment: Gait belt;Other (comment)(followed with wheelchair for safety) Activity Tolerance: Patient tolerated treatment well;Patient limited by fatigue Patient left: in chair;with call bell/phone within reach Nurse Communication: Mobility status PT Visit Diagnosis: Unsteadiness on feet (R26.81);Other abnormalities of gait and mobility (R26.89);Muscle weakness (generalized) (M62.81)     Time: 1610-9604 PT Time Calculation (min) (ACUTE ONLY): 37 min  Charges:  $Gait Training: 8-22 mins $Therapeutic Exercise: 8-22 mins                     G Codes:       2:10 PM, Dec 07, 2017 Lonell Grandchild, MPT Physical Therapist with [facility] [phone] office [phone] mobile phone

## 2017-11-12 NOTE — Discharge Summary (Addendum)
Physician Discharge Summary  [name] OQH:[id number] DOB: [birth year] DOA: 11/09/2017  PCP: [name], MD  Admit date: 11/09/2017 Discharge date: 11/12/2017  Time spent: 45 minutes  Recommendations for Outpatient Follow-up:  -Will be discharged home today. -Will need to follow up with Dr. Alyson Ingles, urology, in 2 weeks. -Also needs follow up with PCP in 2 weeks. -To complete 8 days of cipro for Citrobacter UTI.   Discharge Diagnoses:  Principal Problem:   UTI (urinary tract infection) Active Problems:   Insulin dependent diabetes mellitus (HCC)   Morbid obesity (HCC)   Multiple myeloma (HCC)   Hypertension   Atrial flutter (HCC)   Chronic pancreatitis (HCC)   Chronic systolic heart failure (HCC)   CKD (chronic kidney disease)   Sepsis due to undetermined organism ([place])   UTI (urinary tract infection) due to urinary indwelling catheter (HCC)   Multiple myeloma in remission ([place])   CKD (chronic kidney disease) stage 3, GFR 30-59 ml/min (HCC)   Chronic systolic CHF (congestive heart failure) ([place])   Discharge Condition: Stable and improved  Filed Weights   11/09/17 1721 11/09/17 2005  Weight: (!) 181.4 kg (400 lb) (!) 152.7 kg (336 lb 10.3 oz)    History of present illness:  As per Dr. Shanon Brow on 6/10: [name] is a 56 y.o. male with medical history significant of a flutter, multiple myeloma, chronic kidney disease, diabetes, morbid obesity, chronic indwelling Foley catheter due to recent kidney stone comes in with 1 day of high fevers chills at home.  He is noticed for the last couple days his urine in his Foley cath is been extremely cloudy.  He denies any nausea vomiting or diarrhea.  Patient recently was in rehab center and was kicked out after 2 weeks because his insurance would not pay for more than 2 weeks of rehab.  He is getting physical therapy at home twice a week at this time.  Family is not happy because he is not been able to go to his doctor's  appointments.  He normally sees Dr. Arnoldo Morale for his wounds.  He normally gets his wounds debrided weekly.  He normally sees oncology for his multiple myeloma which is not been able to see for almost a year.  At this time he is able to walk a couple of steps with a walker.  Patient is being referred for admission for UTI complicated secondary to Foley catheter.  He denies any pain at this time.    Hospital Course:   Sepsis due to Citrobacter UTI -Due to indwelling foley catheter, nephrolithiasis and indwelling urinary stent. -Based on urine cx, will change abx to cipro. -Sepsis parameters have resolved, is clinically improved. -Seen by GU on 6/12: plan for right ureteroscopic stone extraction in 2 weeks after completion of antibiotics. -He has also recommended a voiding trial which he has passed. Foley catheter has been removed.  Multiple Myeloma -In remission, follow-up outpatient with oncology.  Chronic kidney disease stage III -Baseline creatinine between 1.3 and 1.5. -Creatinine down to 1.62 today from 1.84 on 6/12.  Paroxysmal atrial fibrillation -Well-controlled. -Presently in sinus rhythm. -Continue Xarelto.  Chronic systolic heart failure -Echo with ejection fraction of 30 to 35% with grade 2 diastolic dysfunction. -Appears clinically euvolemic, continue home dose torsemide.  Type 2 diabetes -Well-controlled, continue current regimen.  Morbid obesity -Noted, counseled on lifestyle modification.  Non-healing Ulcer left plantar foot. -Noted. -Continue HHRN and referral to wound care if necessary.   Procedures:  None   Consultations:  Urology, Dr. Alyson Ingles  Discharge Instructions  Discharge Instructions    Diet - low sodium heart healthy   Complete by:  As directed    Increase activity slowly   Complete by:  As directed      Allergies as of 11/12/2017      Reactions   Beef-derived Products Diarrhea   Pork-derived Products Diarrhea        Medication List    TAKE these medications   acetaminophen 500 MG tablet Commonly known as:  TYLENOL Take 500-1,000 mg by mouth every 6 (six) hours as needed for mild pain or moderate pain.   acyclovir 400 MG tablet Commonly known as:  ZOVIRAX Take 1 tablet (400 mg total) by mouth every morning.   allopurinol 300 MG tablet Commonly known as:  ZYLOPRIM TAKE ONE TABLET BY MOUTH ONCE DAILY.   aspirin EC 81 MG tablet Take 81 mg by mouth daily.   atorvastatin 80 MG tablet Commonly known as:  LIPITOR TAKE ONE TABLET BY MOUTH AT BEDTIME.   calcitRIOL 0.25 MCG capsule Commonly known as:  ROCALTROL Take 0.25 mcg by mouth 3 (three) times a week. Monday, Wednesday, Friday.   CALCIUM 600 PO Take 1 tablet by mouth 2 (two) times daily.   chlorproMAZINE 25 MG tablet Commonly known as:  THORAZINE Take 1 tablet (25 mg total) by mouth 4 (four) times daily as needed for hiccoughs.   ciprofloxacin 500 MG tablet Commonly known as:  CIPRO Take 1 tablet (500 mg total) by mouth 2 (two) times daily for 8 days.   DEXILANT 60 MG capsule Generic drug:  dexlansoprazole Take 60 mg by mouth every morning.   dicyclomine 20 MG tablet Commonly known as:  BENTYL TAKE 1 TABLET BY MOUTH BEFORE MEALS 3 TIMES DAILY.   DRY EYES OP Place 1 drop into both eyes as needed (dry eyes).   gabapentin 300 MG capsule Commonly known as:  NEURONTIN Take 1 capsule (300 mg total) by mouth 2 (two) times daily.   insulin detemir 100 UNIT/ML injection Commonly known as:  LEVEMIR Inject 0.1 mLs (10 Units total) into the skin every morning.   insulin lispro 100 UNIT/ML injection Commonly known as:  HUMALOG Inject 0-0.1 mLs (0-10 Units total) into the skin 2 (two) times daily with a meal. Sliding Scale per patient  CBG 150-200: 1 units CBG 201-275:  3 units CBG 275-350: 5 units 351-425: 7 units 425-500: 9 units 500+: 10 units   loratadine 10 MG tablet Commonly known as:  CLARITIN Take 10 mg by mouth every  morning.   methocarbamol 500 MG tablet Commonly known as:  ROBAXIN Take 1 tablet (500 mg total) by mouth every 8 (eight) hours as needed for muscle spasms.   multivitamins ther. w/minerals Tabs tablet Take 1 tablet by mouth daily.   nitroGLYCERIN 0.4 MG SL tablet Commonly known as:  NITROSTAT DISSOLVE 1 TABLET UNDER TONGUE EVERY 5 MINUTES UP TO 15 MIN FOR CHESTPAIN. IF NO RELIEF CALL 911.   ondansetron 4 MG disintegrating tablet Commonly known as:  ZOFRAN ODT Take 1 tablet (4 mg total) by mouth every 8 (eight) hours as needed for nausea or vomiting.   potassium chloride SA 20 MEQ tablet Commonly known as:  K-DUR,KLOR-CON Take 1 tablet (20 mEq total) by mouth daily. What changed:  when to take this   SLOW-MAG PO Take 1 tablet by mouth every morning.   torsemide 20 MG tablet Commonly known as:  DEMADEX Take  1 tablet (20 mg total) by mouth 2 (two) times daily. Take extra 1 tab for weight gain of 3 lbs in 1 day or 5 lbs in 2 days   VIBERZI 100 MG Tabs Generic drug:  Eluxadoline Take 100 mg 2 (two) times daily with a meal by mouth.   Vitamin D (Ergocalciferol) 50000 units Caps capsule Commonly known as:  DRISDOL TAKE 1 CAPSULE BY MOUTH EVERY THIRTY DAYS.   XARELTO 20 MG Tabs tablet Generic drug:  rivaroxaban TAKE 1 TABLET BY MOUTH DAILY.      Allergies  Allergen Reactions  . Beef-Derived Products Diarrhea  . Pork-Derived Products Diarrhea   Follow-up Information    [name], MD. Schedule an appointment as soon as possible for a visit in 2 week(s).   Specialty:  Family Medicine Contact information: [address] [place] [address] [phone]        Herminio Commons, MD .   Specialty:  Cardiology Contact information: [place] [address] [phone]        Cleon Gustin, MD. Schedule an appointment as soon as possible for a visit in 2 week(s).   Specialty:  Urology Contact information: [address] [place] [address] [phone]            The results of significant diagnostics from this hospitalization (including imaging, microbiology, ancillary and laboratory) are listed below for reference.    Significant Diagnostic Studies: Ct Abdomen Pelvis Wo Contrast  Result Date: 11/10/2017 CLINICAL DATA:  Low abdominal pain.  Evaluate for abscess. EXAM: CT ABDOMEN AND PELVIS WITHOUT CONTRAST TECHNIQUE: Multidetector CT imaging of the abdomen and pelvis was performed following the standard protocol without IV contrast. COMPARISON:  09/30/2017. FINDINGS: Lower chest: No pleural or pericardial effusion. There is a large diaphragmatic hernia arising from the lateral aspect of the left hemidiaphragm which contains nonobstructed loops of large bowel. No acute abnormality noted. Hepatobiliary: No focal liver abnormality identified. Small stone within the gallbladder measures 5 mm. No biliary dilatation. Pancreas: The pancreas is unremarkable. Spleen: Normal appearance of the spleen. Adrenals/Urinary Tract: The adrenal glands are normal. Bilateral perinephric fat stranding is identified, similar to previous exam. A right-sided nephroureteral stent is in place. No right hydronephrosis identified. Small stone within the inferior pole collecting system of the left kidney measures 4 mm. The urinary bladder is collapsed around a Foley catheter balloon. Stomach/Bowel: Small hiatal hernia. The stomach is nondistended. The small bowel loops have a normal caliber. The appendix is visualized and appears normal. No pathologic dilatation of the colon. Intrathoracic splenic flexure due to large diaphragmatic hernia noted. Vascular/Lymphatic: Aortic atherosclerosis. No aneurysm. No abdominal or pelvic adenopathy identified. Reproductive: Prostate is unremarkable. Other: There is no free fluid or fluid collections identified within the abdomen or pelvis. Musculoskeletal: Scattered sclerotic foci are identified within  the lumbar spine and bony pelvis unchanged from previous exam. IMPRESSION: 1. No acute findings and no significant change from 09/30/2017. No abscess identified as clinically questioned. 2. Large left-sided diaphragmatic hernia containing nonobstructed loops of colon. 3. Gallstone. 4.  Aortic Atherosclerosis (ICD10-I70.0). 5. Stable position of right-sided nephroureteral stent. Electronically Signed   By: Kerby Moors M.D.   On: 11/10/2017 13:36   Dg Chest Portable 1 View  Result Date: 11/09/2017 CLINICAL DATA:  Fever.  Sepsis. EXAM: PORTABLE CHEST 1 VIEW COMPARISON:  10/05/2017 and CT on 07/30/2017 FINDINGS: Heart size is stable. A large left diaphragmatic hernia is again  seen which contains bowel. Interval decrease in bilateral interstitial and airspace disease is seen. No new or worsening areas of pulmonary opacity are identified. IMPRESSION: Decreased bilateral interstitial and airspace disease. Large left diaphragmatic hernia containing bowel. Electronically Signed   By: Earle Gell M.D.   On: 11/09/2017 17:31    Microbiology: Recent Results (from the past 240 hour(s))  Urine culture     Status: Abnormal   Collection Time: 11/09/17  5:01 PM  Result Value Ref Range Status   Specimen Description   Final    URINE, RANDOM Performed at [facility], [address], [place], [place] [address]    Special Requests   Final    NONE Performed at [facility], [address], [place], [place] [address]    Culture >=100,000 COLONIES/mL CITROBACTER SPECIES (A)  Final   Report Status 11/11/2017 FINAL  Final   Organism ID, Bacteria CITROBACTER SPECIES (A)  Final      Susceptibility   Citrobacter species - MIC*    CEFAZOLIN >=64 RESISTANT Resistant     CEFTRIAXONE 16 INTERMEDIATE Intermediate     CIPROFLOXACIN <=0.25 SENSITIVE Sensitive     GENTAMICIN <=1 SENSITIVE Sensitive     IMIPENEM 2 SENSITIVE Sensitive     NITROFURANTOIN <=16 SENSITIVE Sensitive     TRIMETH/SULFA <=20 SENSITIVE Sensitive      PIP/TAZO 64 INTERMEDIATE Intermediate     * >=100,000 COLONIES/mL CITROBACTER SPECIES  Culture, blood (Routine x 2)     Status: None (Preliminary result)   Collection Time: 11/09/17  5:13 PM  Result Value Ref Range Status   Specimen Description BLOOD LEFT WRIST  Final   Special Requests   Final    BOTTLES DRAWN AEROBIC AND ANAEROBIC Blood Culture adequate volume   Culture   Final    NO GROWTH 3 DAYS Performed at [facility], [address], [place], [place] [address]    Report Status PENDING  Incomplete  Culture, blood (Routine x 2)     Status: None (Preliminary result)   Collection Time: 11/09/17  5:13 PM  Result Value Ref Range Status   Specimen Description LEFT ANTECUBITAL  Final   Special Requests   Final    BOTTLES DRAWN AEROBIC AND ANAEROBIC Blood Culture adequate volume   Culture   Final    NO GROWTH 3 DAYS Performed at [facility], [address] [place], [place] [address]    Report Status PENDING  Incomplete  MRSA PCR Screening     Status: Abnormal   Collection Time: 11/09/17  8:15 PM  Result Value Ref Range Status   MRSA by PCR POSITIVE (A) NEGATIVE Final    Comment:        The GeneXpert MRSA Assay (FDA approved for NASAL specimens only), is one component of a comprehensive MRSA colonization surveillance program. It is not intended to diagnose MRSA infection nor to guide or monitor treatment for MRSA infections. RESULT CALLED TO, READ BACK BY AND VERIFIED WITH: Hermine Messick 11/10/17 _0  [id number] Performed at [facility], [address], [place], [place] [address]      Labs: Basic Metabolic Panel: Recent Labs  Lab 11/09/17 1713 11/10/17 0810 11/11/17 0617 11/12/17 0550  NA 141 136 140 140  K 4.0 4.0 3.9 3.7  CL 106 104 105 107  CO2 _1 GLUCOSE 141* 116* 113* 113*  BUN 40* 40* 40* 34*  CREATININE 1.54* 1.79* 1.84* 1.62*  CALCIUM 8.9 7.9* 8.1* 8.1*   Liver Function Tests:  Recent Labs  Lab 11/09/17 1713  AST 36  ALT 23  ALKPHOS  102  BILITOT 0.8  PROT 6.7  ALBUMIN 3.1*   No results for input(s): LIPASE, AMYLASE in the last 168 hours. No results for input(s): AMMONIA in the last 168 hours. CBC: Recent Labs  Lab 11/09/17 1713 11/10/17 0810 11/11/17 0617 11/12/17 0550  WBC 10.2 8.0 7.2 5.8  NEUTROABS 7.3  --   --   --   HGB 9.7* 8.5* 8.0* 8.3*  HCT 31.1* 26.5* 26.1* 26.4*  MCV 96.3 95.3 97.8 96.0  PLT 187 144* 143* 166   Cardiac Enzymes: Recent Labs  Lab 11/09/17 1702  TROPONINI 0.03*   BNP: BNP (last 3 results) Recent Labs    08/13/17 1310  BNP 576.0*    ProBNP (last 3 results) No results for input(s): PROBNP in the last 8760 hours.  CBG: Recent Labs  Lab 11/11/17 0755 11/11/17 1137 11/11/17 1621 11/11/17 2154 11/12/17 0807  GLUCAP 116* 116* 111* 142* 102*       Signed:  [facility] Pager: [phone] 11/12/2017, 12:46 PM

## 2017-11-12 NOTE — Care Management (Addendum)
Patient will have [facility] for RN, PT and OT. Juliann Pulse of [facility] notified and will obtain orders. Patient updated. Discharging home today. Wife will transport patient home.  Patient would like [facility] to deliver a shower stool to his home. Juliann Pulse of [facility] notified and will obtain order and have delivered.

## 2017-11-13 ENCOUNTER — Ambulatory Visit: Payer: BLUE CROSS/BLUE SHIELD | Admitting: Family

## 2017-11-13 LAB — PROTEIN ELECTROPHORESIS, SERUM
A/G Ratio: 0.8 (ref 0.7–1.7)
Albumin ELP: 2.8 g/dL — ABNORMAL LOW (ref 2.9–4.4)
Alpha-1-Globulin: 0.3 g/dL (ref 0.0–0.4)
Alpha-2-Globulin: 0.8 g/dL (ref 0.4–1.0)
Beta Globulin: 1 g/dL (ref 0.7–1.3)
Gamma Globulin: 1.2 g/dL (ref 0.4–1.8)
Globulin, Total: 3.3 g/dL (ref 2.2–3.9)
Total Protein ELP: 6.1 g/dL (ref 6.0–8.5)

## 2017-11-13 LAB — KAPPA/LAMBDA LIGHT CHAINS
Kappa free light chain: 77.8 mg/L — ABNORMAL HIGH (ref 3.3–19.4)
Kappa, lambda light chain ratio: 1.53 (ref 0.26–1.65)
Lambda free light chains: 50.9 mg/L — ABNORMAL HIGH (ref 5.7–26.3)

## 2017-11-14 LAB — CULTURE, BLOOD (ROUTINE X 2)
Culture: NO GROWTH
Culture: NO GROWTH
Special Requests: ADEQUATE
Special Requests: ADEQUATE

## 2017-11-15 LAB — IMMUNOFIXATION ELECTROPHORESIS
IgA: 185 mg/dL (ref 90–386)
IgG (Immunoglobin G), Serum: 1227 mg/dL (ref 700–1600)
IgM (Immunoglobulin M), Srm: 145 mg/dL (ref 20–172)
Total Protein ELP: 6.3 g/dL (ref 6.0–8.5)

## 2017-11-23 ENCOUNTER — Telehealth (HOSPITAL_COMMUNITY): Payer: Self-pay | Admitting: *Deleted

## 2017-11-25 ENCOUNTER — Other Ambulatory Visit: Payer: Self-pay | Admitting: Urology

## 2017-11-26 NOTE — Telephone Encounter (Signed)
Pt aware that per [name] his multiple myeloma labs are normal.

## 2017-12-01 ENCOUNTER — Encounter: Payer: Self-pay | Admitting: Infectious Diseases

## 2017-12-01 ENCOUNTER — Ambulatory Visit (INDEPENDENT_AMBULATORY_CARE_PROVIDER_SITE_OTHER): Payer: BLUE CROSS/BLUE SHIELD | Admitting: Infectious Diseases

## 2017-12-01 VITALS — BP 131/78 | HR 72 | Temp 98.0°F

## 2017-12-01 DIAGNOSIS — N183 Chronic kidney disease, stage 3 unspecified: Secondary | ICD-10-CM

## 2017-12-01 DIAGNOSIS — R82998 Other abnormal findings in urine: Secondary | ICD-10-CM

## 2017-12-01 DIAGNOSIS — R7881 Bacteremia: Secondary | ICD-10-CM | POA: Diagnosis not present

## 2017-12-01 DIAGNOSIS — N493 Fournier gangrene: Secondary | ICD-10-CM

## 2017-12-01 DIAGNOSIS — N390 Urinary tract infection, site not specified: Secondary | ICD-10-CM

## 2017-12-01 DIAGNOSIS — B9620 Unspecified Escherichia coli [E. coli] as the cause of diseases classified elsewhere: Secondary | ICD-10-CM

## 2017-12-01 DIAGNOSIS — B9562 Methicillin resistant Staphylococcus aureus infection as the cause of diseases classified elsewhere: Secondary | ICD-10-CM

## 2017-12-01 DIAGNOSIS — T83511D Infection and inflammatory reaction due to indwelling urethral catheter, subsequent encounter: Secondary | ICD-10-CM | POA: Diagnosis not present

## 2017-12-01 NOTE — Progress Notes (Signed)
Patient: [name]  DOB: 07/09/1960 MRN: [id number] PCP: [name], MD  Referring Provider: hospital follow up   Patient Active Problem List   Diagnosis Date Noted  . Dark urine 12/02/2017  . Multiple myeloma in remission ([place]) 11/10/2017  . CKD (chronic kidney disease) stage 3, GFR 30-59 ml/min (HCC) 11/10/2017  . Chronic systolic CHF (congestive heart failure) ([place]) 11/10/2017  . MRSA bacteremia 10/03/2017  . Pressure injury of skin 08/04/2017  . Fournier's gangrene of scrotum 04/14/2017  . CAP (community acquired pneumonia) 06/10/2016  . Hypogammaglobulinemia ([place]) 03/09/2016  . Diaphragmatic hernia without obstruction 02/26/2016  . Chronic systolic heart failure ([place]) 12/19/2013  . Anemia, normocytic normochromic 10/09/2012  . Chronic pancreatitis ([place]) 10/07/2012  . Atrial flutter ([place]) 09/17/2012  . DDD (degenerative disc disease), cervical 03/18/2012  . Arteriosclerotic cardiovascular disease (ASCVD)   . Hypertension   . Hyperlipidemia   . Multiple myeloma ([place]) 07/01/2011  . Morbid obesity ([place]) 04/29/2010  . Insulin dependent diabetes mellitus ([place]) 11/15/2008  . Sleep apnea 11/15/2008     Subjective:  [name] is a 57 y.o. AA male here today with his wife for follow up on polymicrobial bacteremia in the setting of Fournier's gangrene and UTI. Significant PMHx noted for multiple myeloma, chest port access, DM, ureteral stent 07-31-17 d/t stone (Klebsiella bacteremia at this time).  He was transferred to [facility] from [facility] and seen in the hospital by Dr. Johnnye Sima on 10/01/2017 after found to have MRSA and E coli bacteremia. Work up for SAB included TTE - negative. Polymicrobial bacteremia attributed to active fournier's. He received 4 weeks of IV vancomycin/ancef through 11/02/17.  Unfortunately on 11/09/17 he experienced gradual onset chills/fevers at home and presented to ED for evaluation - 73 F en route to hospital. Found to have UTI in the  setting of chronic indwelling foley cathter 1 week after it was exchanged (which sounded to be traumatic for him and sediment ensued quickly after insertion).   Interval history noted for Hospital Stay for UTI. Foley catheter has been removed - stopped his antibiotics (last was Cipro) 2-3 weeks ago. Blood cultures were repeated at this time on 11/09/17 and both sets were negative. Urine culture >100k colonies of citrobacter species (R-cefazolin; I-pip/tazo, CTX). He was treated with ciprofloxacin x 8d.   Today he reports he is feeling "generally well" but afraid to say anything as he has been so intermittently sick over the last few months. Indwelling foley has been removed and he had F/U with Urology last week. Ureteral stent is still in place. This is scheduled to be removed on 12/16/17. Over the last day he has noticed darkening of his urine. No other symptoms to report - no abdominal pain, dysuria, flank/back pain, malaise, fevers chills. He tells me he has been drinking plenty of fluids (mostly water) since discharge and has a good appetite. He and his wife are very nervous about the possibility of getting another infection if we don't "intervene early." Wounds to scrotum/groin are healed up and no longer require any dressings.   He is getting PT in the home and feels stronger. Can do some steps on level ground now.   Review of Systems  All other systems reviewed and are negative.   Past Medical History:  Diagnosis Date  . Abscess 04/2017   Scrotal  . Anemia   . Arteriosclerotic cardiovascular disease (ASCVD)    MI-2000s; stent to the proximal LAD and diagonal in 2001; stress  nuclear in 2008-impaired exercise capacity, left ventricular dilatation, moderately to severely depressed EF, apical, inferior and anteroseptal scar  . Arthritis   . Atrial flutter ([place])   . 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 Cellulitis of leg    both legs  . Chronic diarrhea   . Chronic kidney disease, stage 3,  mod decreased GFR (HCC)    Creatinine of 1.84 in 06/2011 and 1.5 in 07/2011  . Diabetes mellitus    Insulin  . Dysrhythmia    AFlutter  . GERD (gastroesophageal reflux disease)   . Gout   . Hyperlipidemia   . Hypertension   . Injection site reaction   . Multiple myeloma 07/01/2011  . Myocardial infarction ([place]) 2000  . Obesity   . Pedal edema    Venous insufficiency  . Sleep apnea    uses cpap  . Ulcer     Outpatient Medications Prior to Visit  Medication Sig Dispense Refill  . acetaminophen (TYLENOL) 500 MG tablet Take 500-1,000 mg by mouth every 6 (six) hours as needed for mild pain or moderate pain.    Marland Kitchen acyclovir (ZOVIRAX) 400 MG tablet Take 1 tablet (400 mg total) by mouth every morning. 30 tablet 5  . allopurinol (ZYLOPRIM) 300 MG tablet TAKE ONE TABLET BY MOUTH ONCE DAILY. 30 tablet 2  . Artificial Tear Ointment (DRY EYES OP) Place 1 drop into both eyes as needed (dry eyes).    Marland Kitchen aspirin EC 81 MG tablet Take 81 mg by mouth daily.    Marland Kitchen atorvastatin (LIPITOR) 80 MG tablet TAKE ONE TABLET BY MOUTH AT BEDTIME. 90 tablet 3  . calcitRIOL (ROCALTROL) 0.25 MCG capsule Take 0.25 mcg by mouth 3 (three) times a week. Monday, Wednesday, Friday.    . Calcium Carbonate (CALCIUM 600 PO) Take 1 tablet by mouth 2 (two) times daily.    . chlorproMAZINE (THORAZINE) 25 MG tablet Take 1 tablet (25 mg total) by mouth 4 (four) times daily as needed for hiccoughs. 60 tablet 0  . dexlansoprazole (DEXILANT) 60 MG capsule Take 60 mg by mouth every morning.    . dicyclomine (BENTYL) 20 MG tablet TAKE 1 TABLET BY MOUTH BEFORE MEALS 3 TIMES DAILY. 90 tablet 3  . Eluxadoline (VIBERZI) 100 MG TABS Take 100 mg 2 (two) times daily with a meal by mouth.     . gabapentin (NEURONTIN) 300 MG capsule Take 1 capsule (300 mg total) by mouth 2 (two) times daily. 60 capsule 0  . insulin detemir (LEVEMIR) 100 UNIT/ML injection Inject 0.1 mLs (10 Units total) into the skin every morning. 10 mL 11  . insulin lispro  (HUMALOG) 100 UNIT/ML injection Inject 0-0.1 mLs (0-10 Units total) into the skin 2 (two) times daily with a meal. Sliding Scale per patient  CBG 150-200: 1 units CBG 201-275:  3 units CBG 275-350: 5 units 351-425: 7 units 425-500: 9 units 500+: 10 units 10 mL 0  . loratadine (CLARITIN) 10 MG tablet Take 10 mg by mouth every morning.     . Magnesium Cl-Calcium Carbonate (SLOW-MAG PO) Take 1 tablet by mouth every morning.    . methocarbamol (ROBAXIN) 500 MG tablet Take 1 tablet (500 mg total) by mouth every 8 (eight) hours as needed for muscle spasms. 15 tablet 0  . Multiple Vitamins-Minerals (MULTIVITAMINS THER. W/MINERALS) TABS Take 1 tablet by mouth daily.     . nitroGLYCERIN (NITROSTAT) 0.4 MG SL tablet DISSOLVE 1 TABLET UNDER TONGUE EVERY 5 MINUTES UP TO 15 MIN FOR CHESTPAIN. IF  NO RELIEF CALL 911. 25 tablet 0  . ondansetron (ZOFRAN ODT) 4 MG disintegrating tablet Take 1 tablet (4 mg total) by mouth every 8 (eight) hours as needed for nausea or vomiting. 20 tablet 0  . potassium chloride SA (K-DUR,KLOR-CON) 20 MEQ tablet Take 1 tablet (20 mEq total) by mouth daily. (Patient taking differently: Take 20 mEq by mouth 2 (two) times daily. ) 30 tablet 0  . torsemide (DEMADEX) 20 MG tablet Take 1 tablet (20 mg total) by mouth 2 (two) times daily. Take extra 1 tab for weight gain of 3 lbs in 1 day or 5 lbs in 2 days 90 tablet 3  . Vitamin D, Ergocalciferol, (DRISDOL) 50000 units CAPS capsule TAKE 1 CAPSULE BY MOUTH EVERY THIRTY DAYS. 3 capsule PRN  . XARELTO 20 MG TABS tablet TAKE 1 TABLET BY MOUTH DAILY. 30 tablet 6   Facility-Administered Medications Prior to Visit  Medication Dose Route Frequency Provider Last Rate Last Dose  . sodium chloride 0.9 % injection 10 mL  10 mL Intravenous Once Farrel Gobble, MD         Allergies  Allergen Reactions  . Beef-Derived Products Diarrhea  . Pork-Derived Products Diarrhea    Social History   Tobacco Use  . Smoking status: Former Smoker     Packs/day: 0.25    Years: 1.00    Pack years: 0.25    Types: Cigarettes, Cigars    Last attempt to quit: 05/17/2001    Years since quitting: 16.5  . Smokeless tobacco: Never Used  Substance Use Topics  . Alcohol use: No    Alcohol/week: 0.0 oz  . Drug use: No    Family History  Problem Relation Age of Onset  . Heart disease Mother   . Cancer Mother   . Diabetes Father   . Arthritis Unknown   . Anesthesia problems Neg Hx   . Hypotension Neg Hx   . Malignant hyperthermia Neg Hx   . Pseudochol deficiency Neg Hx     Objective:   Vitals:   12/01/17 1022  BP: 131/78  Pulse: 72  Temp: 98 F (36.7 C)  TempSrc: Oral   There is no height or weight on file to calculate BMI.  Physical Exam  Constitutional: He is oriented to person, place, and time. He appears well-developed and well-nourished.  Older than stated age male in wheelchair. No distress. Accompanied by his wife.   HENT:  Mouth/Throat: Oropharynx is clear and moist.  Eyes: Pupils are equal, round, and reactive to light. No scleral icterus.  Cardiovascular: Normal rate, regular rhythm and normal heart sounds.  Pulmonary/Chest: Effort normal and breath sounds normal.  Abdominal: Soft. He exhibits no distension. There is no tenderness.  No CVA tenderness.   Genitourinary:  Genitourinary Comments: Deferred per patient since wounds are healed.   Musculoskeletal: He exhibits edema (1+. Compression stockings in place. ).  Lymphadenopathy:    He has no cervical adenopathy.  Neurological: He is alert and oriented to person, place, and time.  Skin: Skin is warm and dry. No rash noted.  Psychiatric: He has a normal mood and affect. Thought content normal.    Lab Results: Lab Results  Component Value Date   WBC 5.8 11/12/2017   HGB 8.3 (L) 11/12/2017   HCT 26.4 (L) 11/12/2017   MCV 96.0 11/12/2017   PLT 166 11/12/2017    Lab Results  Component Value Date   CREATININE 1.62 (H) 11/12/2017   BUN 34 (H) 11/12/2017  NA 140 11/12/2017   K 3.7 11/12/2017   CL 107 11/12/2017   CO2 26 11/12/2017    Lab Results  Component Value Date   ALT 23 11/09/2017   AST 36 11/09/2017   ALKPHOS 102 11/09/2017   BILITOT 0.8 11/09/2017     Assessment & Plan:   Problem List Items Addressed This Visit      Musculoskeletal and Integument   Fournier's gangrene of scrotum    Reported to be healed and no longer requiring dressing changes. Mr. Hohensee deferred exam today. Follow up with urology was last week.         Genitourinary   RESOLVED: UTI (urinary tract infection) due to urinary indwelling catheter (HCC)    Citrobacter UTI recently - treated       CKD (chronic kidney disease) stage 3, GFR 30-59 ml/min (HCC)    Kidney function reviewed with Mr. Bright and back to baseline.         Other   MRSA bacteremia    Treated x 4w for secondary bacteremia in the setting of Fournier's gangrene. He had repeat blood cultures that were clear 7 days after completing treatment. No signs of relapse or metastatic infection presently. Return precautions provided. Discussed true test of cure is continuing off antibiotics and following closely.       RESOLVED: E. coli bacteremia    Treated - resolved.       Dark urine    Reported to be a new event over the last day. He says it is better this morning. No other symptoms to speak of for now. Provided reassurance that the biggest risk factor is the catheter is removed. Encouraged to continue to stay well hydrate to flush kidneys/bladder. I do worry about his ureteral stent causing some trauma as this has been in place for 4 months now - I asked that should this persist in to tomorrow to please notify his urology team to ensure there is nothing concerning. If there are new symptoms that develop I asked he and his wife to please call/send MyChart to our clinic as I would have low threshold to treat presumptively with antibiotic.  Follow up with urology team for ongoing care  and with ID team as needed.          Janene Madeira, MSN, NP-C [facility] [place] Pager: [phone] Office: [phone]  12/02/17  11:13 AM

## 2017-12-01 NOTE — Patient Instructions (Addendum)
Things to call back about that are worrisome for infection:   Fevers  Chills  Sweats   Abdominal pain  Back/flank pain   Pain/burning with passing urine   The discoloration may be related to the stent - if this continues over the next day without any other symptoms would also call Dr. Noah Delaine to let him and his team know of this change.   We can see you back anytime should you need our assistance but your blood stream infection seems to have been cured.   It was a pleasure to meet you both and happy to hear you are healing up.

## 2017-12-02 ENCOUNTER — Encounter (HOSPITAL_COMMUNITY): Payer: Self-pay | Admitting: Hematology

## 2017-12-02 ENCOUNTER — Inpatient Hospital Stay (HOSPITAL_COMMUNITY): Payer: BLUE CROSS/BLUE SHIELD | Attending: Hematology | Admitting: Hematology

## 2017-12-02 VITALS — BP 136/55 | HR 72 | Temp 98.5°F | Resp 18 | Wt 347.8 lb

## 2017-12-02 DIAGNOSIS — K219 Gastro-esophageal reflux disease without esophagitis: Secondary | ICD-10-CM | POA: Diagnosis not present

## 2017-12-02 DIAGNOSIS — R197 Diarrhea, unspecified: Secondary | ICD-10-CM

## 2017-12-02 DIAGNOSIS — Z794 Long term (current) use of insulin: Secondary | ICD-10-CM | POA: Diagnosis not present

## 2017-12-02 DIAGNOSIS — R531 Weakness: Secondary | ICD-10-CM

## 2017-12-02 DIAGNOSIS — G4730 Sleep apnea, unspecified: Secondary | ICD-10-CM | POA: Diagnosis not present

## 2017-12-02 DIAGNOSIS — R5383 Other fatigue: Secondary | ICD-10-CM | POA: Diagnosis not present

## 2017-12-02 DIAGNOSIS — I252 Old myocardial infarction: Secondary | ICD-10-CM | POA: Diagnosis not present

## 2017-12-02 DIAGNOSIS — R82998 Other abnormal findings in urine: Secondary | ICD-10-CM | POA: Insufficient documentation

## 2017-12-02 DIAGNOSIS — Z7982 Long term (current) use of aspirin: Secondary | ICD-10-CM

## 2017-12-02 DIAGNOSIS — C9001 Multiple myeloma in remission: Secondary | ICD-10-CM

## 2017-12-02 DIAGNOSIS — I5022 Chronic systolic (congestive) heart failure: Secondary | ICD-10-CM

## 2017-12-02 DIAGNOSIS — I110 Hypertensive heart disease with heart failure: Secondary | ICD-10-CM | POA: Diagnosis not present

## 2017-12-02 DIAGNOSIS — Z87891 Personal history of nicotine dependence: Secondary | ICD-10-CM | POA: Diagnosis not present

## 2017-12-02 DIAGNOSIS — Z7901 Long term (current) use of anticoagulants: Secondary | ICD-10-CM | POA: Diagnosis not present

## 2017-12-02 DIAGNOSIS — C9000 Multiple myeloma not having achieved remission: Secondary | ICD-10-CM

## 2017-12-02 DIAGNOSIS — D649 Anemia, unspecified: Secondary | ICD-10-CM

## 2017-12-02 DIAGNOSIS — E785 Hyperlipidemia, unspecified: Secondary | ICD-10-CM

## 2017-12-02 DIAGNOSIS — Z79899 Other long term (current) drug therapy: Secondary | ICD-10-CM | POA: Diagnosis not present

## 2017-12-02 DIAGNOSIS — M1990 Unspecified osteoarthritis, unspecified site: Secondary | ICD-10-CM

## 2017-12-02 DIAGNOSIS — Z9989 Dependence on other enabling machines and devices: Secondary | ICD-10-CM | POA: Diagnosis not present

## 2017-12-02 DIAGNOSIS — R21 Rash and other nonspecific skin eruption: Secondary | ICD-10-CM

## 2017-12-02 NOTE — Assessment & Plan Note (Signed)
Citrobacter UTI recently - treated

## 2017-12-02 NOTE — Patient Instructions (Signed)
[facility] at [facility] Discharge Instructions  You saw Dr. Katragadda today.   Thank you for choosing [facility] at [facility] to provide your oncology and hematology care.  To afford each patient quality time with our provider, please arrive at least 15 minutes before your scheduled appointment time.   If you have a lab appointment with the Cancer Center please come in thru the  Main Entrance and check in at the main information desk  You need to re-schedule your appointment should you arrive 10 or more minutes late.  We strive to give you quality time with our providers, and arriving late affects you and other patients whose appointments are after yours.  Also, if you no show three or more times for appointments you may be dismissed from the clinic at the providers discretion.     Again, thank you for choosing [facility].  Our hope is that these requests will decrease the amount of time that you wait before being seen by our physicians.       _____________________________________________________________  Should you have questions after your visit to  Cancer Center, please contact our office at [phone] between the hours of 8:30 a.m. and 4:30 p.m.  Voicemails left after 4:30 p.m. will not be returned until the following business day.  For prescription refill requests, have your pharmacy contact our office.       Resources For Cancer Patients and their Caregivers ? American Cancer Society: Can assist with transportation, wigs, general needs, runs Look Good Feel Better.        1-888-227-6333 ? Cancer Care: Provides financial assistance, online support groups, medication/co-pay assistance.  1-800-813-HOPE (4673) ? [facility] Assists [place] cancer patients and their families through emotional , educational and financial support.  336-427-4357 ? [place] DSS Where to apply for  food stamps, Medicaid and utility assistance. 336-342-1394 ? [facility]: Transportation to medical appointments. 336-347-2287 ? Social Security Administration: May apply for disability if have a Stage IV cancer. 336-342-7796 1-800-772-1213 ? [place] Aging, Disability and Transit Services: Assists with nutrition, care and transit needs. 336-349-2343  Cancer Center Support Programs:   > Cancer Support Group  2nd Tuesday of the month 1pm-2pm, Journey Room   > Creative Journey  3rd Tuesday of the month 1130am-1pm, Journey Room     

## 2017-12-02 NOTE — Assessment & Plan Note (Signed)
Reported to be healed and no longer requiring dressing changes. [name] deferred exam today. Follow up with urology was last week.

## 2017-12-02 NOTE — Assessment & Plan Note (Addendum)
Reported to be a new event over the last day. He says it is better this morning. No other symptoms to speak of for now. Provided reassurance that the biggest risk factor is the catheter is removed. Encouraged to continue to stay well hydrate to flush kidneys/bladder. I do worry about his ureteral stent causing some trauma as this has been in place for 4 months now - I asked that should this persist in to tomorrow to please notify his urology team to ensure there is nothing concerning. If there are new symptoms that develop I asked he and his wife to please call/send MyChart to our clinic as I would have low threshold to treat presumptively with antibiotic.  Follow up with urology team for ongoing care and with ID team as needed.

## 2017-12-02 NOTE — Progress Notes (Signed)
[facility] [place], [place] [address]   CLINIC:  Medical Oncology/Hematology  PCP:  [name], [address] [place] [address] [phone]   REASON FOR VISIT:  Follow-up for Multiple Myeloma  CURRENT THERAPY: Observation   INTERVAL HISTORY:  [name] 57 y.o. male returns for routine follow-up for his multiple myeloma. He is here for observation has been in remission and following as an outpt. Patient has recently been treated by his doctor for a UTI with Cipro. He has a permanent indwelling foley catheter. He was admitted to the hospital for sepsis related to the UTI.  Energy levels 50%; appetite 100%. Patient went to rehah following discharge from the hospital for 2 weeks. He is now receiving home health PT twice a week. Patient is brought in today by his wife. He is weaker and needs assistance with standing and daily activities. Patient is only taking a few steps with his walker. Patient denies nausea, vomiting, or diarrhea. Patient is having a procedure July 17th to remove his kidney stone. He has a stent that was placed on his last admission to the hospital. Denies any new pains.       REVIEW OF SYSTEMS:  Review of Systems  Constitutional: Positive for fatigue.  Gastrointestinal: Positive for diarrhea.  Skin: Positive for itching and rash.  Neurological: Positive for extremity weakness and numbness.  Hematological: Bruises/bleeds easily.  All other systems reviewed and are negative.    PAST MEDICAL/SURGICAL HISTORY:  Past Medical History:  Diagnosis Date  . Abscess 04/2017   Scrotal  . Anemia   . Arteriosclerotic cardiovascular disease (ASCVD)    MI-2000s; stent to the proximal LAD and diagonal in 2001; stress nuclear in 2008-impaired exercise capacity, left ventricular dilatation, moderately to severely depressed EF, apical, inferior and anteroseptal scar  . Arthritis   . Atrial flutter ([place])   . Bence-Jones  proteinuria 05/05/2011  . Cellulitis of leg    both legs  . Chronic diarrhea   . Chronic kidney disease, stage 3, mod decreased GFR (HCC)    Creatinine of 1.84 in 06/2011 and 1.5 in 07/2011  . Diabetes mellitus    Insulin  . Dysrhythmia    AFlutter  . GERD (gastroesophageal reflux disease)   . Gout   . Hyperlipidemia   . Hypertension   . Injection site reaction   . Multiple myeloma 07/01/2011  . Myocardial infarction ([facility]) 2000  . Obesity   . Pedal edema    Venous insufficiency  . Sleep apnea    uses cpap  . Ulcer    Past Surgical History:  Procedure Laterality Date  . ABSCESS DRAINAGE     Scrotal  . BIOPSY  01/02/2012   Procedure: BIOPSY;  Surgeon: Rogene Houston, MD;  [facility];  Service: Endoscopy;  Laterality: N/A;  . BONE MARROW BIOPSY  05/13/11  . CARDIAC CATHETERIZATION     cardiac stent  . CARDIOVERSION N/A 10/13/2012   Procedure: CARDIOVERSION;  Surgeon: Yehuda Savannah, MD;  Location: [facility];  Service: Cardiovascular;  Laterality: N/A;  . CATARACT EXTRACTION W/PHACO Left 02/13/2014   Procedure: CATARACT EXTRACTION PHACO AND INTRAOCULAR LENS PLACEMENT ([place]);  Surgeon: Tonny Branch, MD;  Location: [facility];  Service: Ophthalmology;  Laterality: Left;  CDE:  7.67  . CATARACT EXTRACTION W/PHACO Right 03/02/2014   Procedure: CATARACT EXTRACTION PHACO AND INTRAOCULAR LENS PLACEMENT RIGHT EYE CDE=16.81;  Surgeon: Tonny Branch, MD;  Location: [facility];  Service: Ophthalmology;  Laterality: Right;  . COLONOSCOPY  11/28/2011   Procedure: COLONOSCOPY;  Surgeon: Rogene Houston, MD;  [facility];  Service: Endoscopy;  Laterality: N/A;  930  . CYSTOSCOPY W/ URETERAL STENT PLACEMENT Right 07/31/2017   Procedure: CYSTOSCOPY WITH RETROGRADE PYELOGRAM/URETERAL STENT PLACEMENT;  Surgeon: Cleon Gustin, MD;  [facility];  Service: Urology;  Laterality: Right;  . ESOPHAGOGASTRODUODENOSCOPY  01/02/2012   Procedure: ESOPHAGOGASTRODUODENOSCOPY (EGD);  Surgeon: Rogene Houston, MD;  [facility];  Service: Endoscopy;  Laterality: N/A;  100  . ESOPHAGOGASTRODUODENOSCOPY N/A 09/20/2012   Procedure: ESOPHAGOGASTRODUODENOSCOPY (EGD);  Surgeon: Rogene Houston, MD;  [facility];  Service: Endoscopy;  Laterality: N/A;  . EUS N/A 10/07/2012   Procedure: UPPER ENDOSCOPIC ULTRASOUND (EUS) LINEAR;  Surgeon: Milus Banister, MD;  [facility];  Service: Endoscopy;  Laterality: N/A;  . INCISION AND DRAINAGE ABSCESS N/A 04/14/2017   Procedure: INCISION AND DRAINAGE ABSCESS;  Surgeon: Ceasar Mons, MD;  [facility];  Service: Urology;  Laterality: N/A;  . INCISION AND DRAINAGE ABSCESS ANAL    . IRRIGATION AND DEBRIDEMENT ABSCESS N/A 04/15/2017   Procedure: DEBRIDEMENT SCROTAL WOUND AND DRESSING CHANGE;  Surgeon: Ceasar Mons, MD;  [facility];  Service: Urology;  Laterality: N/A;  . IRRIGATION AND DEBRIDEMENT ABSCESS N/A 04/17/2017   Procedure: IRRIGATION AND DEBRIDEMENT ABSCESS;  Surgeon: Ceasar Mons, MD;  [facility];  Service: Urology;  Laterality: N/A;  RM 3  . LAPAROSCOPIC GASTRIC BANDING  2006   has been removed  . OSTECTOMY Right 04/08/2017   Procedure: OSTECTOMY RIGHT GREAT TOE;  Surgeon: Caprice Beaver, DPM;  [facility];  Service: Podiatry;  Laterality: Right;  . PORT-A-CATH REMOVAL Left 12/07/2012   Procedure: REMOVAL PORT-A-CATH;  Surgeon: Scherry Ran, MD;  [facility];  Service: General;  Laterality: Left;  . PORT-A-CATH REMOVAL N/A 10/02/2017   Procedure: MINOR REMOVAL PORT-A-CATH AT BEDSIDE;  Surgeon: Donnie Mesa, MD;  Location: [facility];  Service: General;  Laterality: N/A;  . PORTACATH PLACEMENT  07/07/2011   Procedure: INSERTION PORT-A-CATH;  Surgeon: Scherry Ran, MD;  [facility];  Service: General;  Laterality: N/A;  . PORTACATH PLACEMENT N/A 12/07/2012   Procedure: INSERTION PORT-A-CATH;  Surgeon: Scherry Ran, MD;  [facility];   Service: General;  Laterality: N/A;  Attempted portacath placement on left and right side  . WOUND DEBRIDEMENT Right 04/08/2017   Procedure: EXCISION ULCERATION RIGHT GREAT TOE;  Surgeon: Caprice Beaver, DPM;  [facility];  Service: Podiatry;  Laterality: Right;  . WRIST SURGERY     Left; removal of bone fragment     SOCIAL HISTORY:  Social History   Socioeconomic History  . Marital status: Married    Spouse name: Not on file  . Number of children: Not on file  . Years of education: 77  . Highest education level: Not on file  Occupational History  . Occupation: Nurse, learning disability  . Occupation: Optometrist: [employer]  Social Needs  . Financial resource strain: Not on file  . Food insecurity:    Worry: Not on file    Inability: Not on file  . Transportation needs:    Medical: Not on file    Non-medical: Not on file  Tobacco Use  . Smoking status: Former Smoker    Packs/day: 0.25    Years: 1.00    Pack years:  0.25    Types: Cigarettes, Cigars    Last attempt to quit: 05/17/2001    Years since quitting: 16.5  . Smokeless tobacco: Never Used  Substance and Sexual Activity  . Alcohol use: No    Alcohol/week: 0.0 oz  . Drug use: No  . Sexual activity: Yes    Birth control/protection: None  Lifestyle  . Physical activity:    Days per week: Not on file    Minutes per session: Not on file  . Stress: Not on file  Relationships  . Social connections:    Talks on phone: Not on file    Gets together: Not on file    Attends religious service: Not on file    Active member of club or organization: Not on file    Attends meetings of clubs or organizations: Not on file    Relationship status: Not on file  . Intimate partner violence:    Fear of current or ex partner: Not on file    Emotionally abused: Not on file    Physically abused: Not on file    Forced sexual activity: Not on file  Other Topics Concern  . Not on file  Social History  Narrative  . Not on file    FAMILY HISTORY:  Family History  Problem Relation Age of Onset  . Heart disease Mother   . Cancer Mother   . Diabetes Father   . Arthritis Unknown   . Anesthesia problems Neg Hx   . Hypotension Neg Hx   . Malignant hyperthermia Neg Hx   . Pseudochol deficiency Neg Hx     CURRENT MEDICATIONS:  Outpatient Encounter Medications as of 12/02/2017  Medication Sig  . acetaminophen (TYLENOL) 500 MG tablet Take 500-1,000 mg by mouth every 6 (six) hours as needed for mild pain or moderate pain.  Marland Kitchen acyclovir (ZOVIRAX) 400 MG tablet Take 1 tablet (400 mg total) by mouth every morning.  Marland Kitchen allopurinol (ZYLOPRIM) 300 MG tablet TAKE ONE TABLET BY MOUTH ONCE DAILY.  Marland Kitchen Artificial Tear Ointment (DRY EYES OP) Place 1 drop into both eyes as needed (dry eyes).  Marland Kitchen aspirin EC 81 MG tablet Take 81 mg by mouth daily.  Marland Kitchen atorvastatin (LIPITOR) 80 MG tablet TAKE ONE TABLET BY MOUTH AT BEDTIME.  . calcitRIOL (ROCALTROL) 0.25 MCG capsule Take 0.25 mcg by mouth 3 (three) times a week. Monday, Wednesday, Friday.  . Calcium Carbonate (CALCIUM 600 PO) Take 1 tablet by mouth 2 (two) times daily.  . chlorproMAZINE (THORAZINE) 25 MG tablet Take 1 tablet (25 mg total) by mouth 4 (four) times daily as needed for hiccoughs.  Marland Kitchen dexlansoprazole (DEXILANT) 60 MG capsule Take 60 mg by mouth every morning.  . dicyclomine (BENTYL) 20 MG tablet TAKE 1 TABLET BY MOUTH BEFORE MEALS 3 TIMES DAILY.  Marland Kitchen Eluxadoline (VIBERZI) 100 MG TABS Take 100 mg 2 (two) times daily with a meal by mouth.   . gabapentin (NEURONTIN) 300 MG capsule Take 1 capsule (300 mg total) by mouth 2 (two) times daily.  . insulin detemir (LEVEMIR) 100 UNIT/ML injection Inject 0.1 mLs (10 Units total) into the skin every morning.  . insulin lispro (HUMALOG) 100 UNIT/ML injection Inject 0-0.1 mLs (0-10 Units total) into the skin 2 (two) times daily with a meal. Sliding Scale per patient  CBG 150-200: 1 units CBG 201-275:  3 units CBG  275-350: 5 units 351-425: 7 units 425-500: 9 units 500+: 10 units  . loratadine (CLARITIN) 10 MG tablet Take  10 mg by mouth every morning.   . Magnesium Cl-Calcium Carbonate (SLOW-MAG PO) Take 1 tablet by mouth every morning.  . methocarbamol (ROBAXIN) 500 MG tablet Take 1 tablet (500 mg total) by mouth every 8 (eight) hours as needed for muscle spasms.  . Multiple Vitamins-Minerals (MULTIVITAMINS THER. W/MINERALS) TABS Take 1 tablet by mouth daily.   . nitroGLYCERIN (NITROSTAT) 0.4 MG SL tablet DISSOLVE 1 TABLET UNDER TONGUE EVERY 5 MINUTES UP TO 15 MIN FOR CHESTPAIN. IF NO RELIEF CALL 911.  . ondansetron (ZOFRAN ODT) 4 MG disintegrating tablet Take 1 tablet (4 mg total) by mouth every 8 (eight) hours as needed for nausea or vomiting.  . potassium chloride SA (K-DUR,KLOR-CON) 20 MEQ tablet Take 1 tablet (20 mEq total) by mouth daily. (Patient taking differently: Take 20 mEq by mouth 2 (two) times daily. )  . torsemide (DEMADEX) 20 MG tablet Take 1 tablet (20 mg total) by mouth 2 (two) times daily. Take extra 1 tab for weight gain of 3 lbs in 1 day or 5 lbs in 2 days  . Vitamin D, Ergocalciferol, (DRISDOL) 50000 units CAPS capsule TAKE 1 CAPSULE BY MOUTH EVERY THIRTY DAYS.  Marland Kitchen XARELTO 20 MG TABS tablet TAKE 1 TABLET BY MOUTH DAILY.   Facility-Administered Encounter Medications as of 12/02/2017  Medication  . sodium chloride 0.9 % injection 10 mL    ALLERGIES:  Allergies  Allergen Reactions  . Beef-Derived Products Diarrhea  . Pork-Derived Products Diarrhea     PHYSICAL EXAM:  ECOG Performance status: 2  Vitals:   12/02/17 1034  BP: (!) 136/55  Pulse: 72  Resp: 18  Temp: 98.5 F (36.9 C)  SpO2: 100%   Filed Weights   12/02/17 1034  Weight: (!) 347 lb 12.8 oz (157.8 kg)    Physical Exam   LABORATORY DATA:  I have reviewed the labs as listed.  CBC    Component Value Date/Time   WBC 5.8 11/12/2017 0550   RBC 2.75 (L) 11/12/2017 0550   HGB 8.3 (L) 11/12/2017 0550   HCT  26.4 (L) 11/12/2017 0550   PLT 166 11/12/2017 0550   MCV 96.0 11/12/2017 0550   MCH 30.2 11/12/2017 0550   MCHC 31.4 11/12/2017 0550   RDW 17.4 (H) 11/12/2017 0550   LYMPHSABS 2.0 11/09/2017 1713   MONOABS 0.8 11/09/2017 1713   EOSABS 0.2 11/09/2017 1713   BASOSABS 0.0 11/09/2017 1713   CMP Latest Ref Rng & Units 11/12/2017 11/11/2017 11/10/2017  Glucose 65 - 99 mg/dL 113(H) 113(H) 116(H)  BUN 6 - 20 mg/dL 34(H) 40(H) 40(H)  Creatinine 0.61 - 1.24 mg/dL 1.62(H) 1.84(H) 1.79(H)  Sodium 135 - 145 mmol/L 140 140 136  Potassium 3.5 - 5.1 mmol/L 3.7 3.9 4.0  Chloride 101 - 111 mmol/L 107 105 104  CO2 22 - 32 mmol/L '26 27 24  ' Calcium 8.9 - 10.3 mg/dL 8.1(L) 8.1(L) 7.9(L)  Total Protein 6.5 - 8.1 g/dL - - -  Total Bilirubin 0.3 - 1.2 mg/dL - - -  Alkaline Phos 38 - 126 U/L - - -  AST 15 - 41 U/L - - -  ALT 17 - 63 U/L - - -        ASSESSMENT & PLAN:   Multiple myeloma (HCC) 1.  Multiple myeloma: - Maintenance [name] and dexamethasone until November 2018, which was discontinued secondary to recurrent infections culminating and development of Fournier's gangrene in November 2018.  Prior to that all his myeloma work-up was negative.  He also  had a bone marrow biopsy which was negative at that time. -We have discussed his myeloma work-up dated 11/12/2017 which shows no M spike on SPEP, negative serum immunofixation, kappa light chains of 77.8, free light chain ratio of 1.53.  Calcium was normal.  His creatinine is around his baseline of 1.62. -He was recently admitted to the hospital from 6/10 through 11/12/2017 for citronella UTI. -We will monitor his myeloma work-up again in 3 months.  He continues to be in remission from myeloma at this time.  2.  Normocytic anemia: -This is from underlying chronic kidney disease and relative iron deficiency. -His ferritin was 135, percent saturation is 12.  I have recommended parenteral iron therapy in the form of Feraheme.  We discussed about the  side effects including rare chance of anaphylaxis.  We will schedule him for 2 weekly infusions.  We will see him back in 3 months with repeat ferritin and iron panel.      Orders placed this encounter:  Orders Placed This Encounter  Procedures  . Protein electrophoresis, serum  . Lactate dehydrogenase, isoenzymes  . Immunofixation electrophoresis  . Kappa/lambda light chains  . CBC with Differential/Platelet  . Comprehensive metabolic panel  . Ferritin  . Iron and TIBC  . Vitamin B12      Derek Jack, [place] [phone]

## 2017-12-02 NOTE — Assessment & Plan Note (Signed)
Kidney function reviewed with [name] and back to baseline.

## 2017-12-02 NOTE — Assessment & Plan Note (Signed)
Treated x 4w for secondary bacteremia in the setting of Fournier's gangrene. He had repeat blood cultures that were clear 7 days after completing treatment. No signs of relapse or metastatic infection presently. Return precautions provided. Discussed true test of cure is continuing off antibiotics and following closely.

## 2017-12-02 NOTE — Assessment & Plan Note (Signed)
1.  Multiple myeloma: - Maintenance [name] and dexamethasone until November 2018, which was discontinued secondary to recurrent infections culminating and development of Fournier's gangrene in November 2018.  Prior to that all his myeloma work-up was negative.  He also had a bone marrow biopsy which was negative at that time. -We have discussed his myeloma work-up dated 11/12/2017 which shows no M spike on SPEP, negative serum immunofixation, kappa light chains of 77.8, free light chain ratio of 1.53.  Calcium was normal.  His creatinine is around his baseline of 1.62. -He was recently admitted to the hospital from 6/10 through 11/12/2017 for citronella UTI. -We will monitor his myeloma work-up again in 3 months.  He continues to be in remission from myeloma at this time.  2.  Normocytic anemia: -This is from underlying chronic kidney disease and relative iron deficiency. -His ferritin was 135, percent saturation is 12.  I have recommended parenteral iron therapy in the form of Feraheme.  We discussed about the side effects including rare chance of anaphylaxis.  We will schedule him for 2 weekly infusions.  We will see him back in 3 months with repeat ferritin and iron panel.

## 2017-12-02 NOTE — Assessment & Plan Note (Signed)
Treated/resolved °

## 2017-12-04 ENCOUNTER — Encounter (INDEPENDENT_AMBULATORY_CARE_PROVIDER_SITE_OTHER): Payer: Self-pay

## 2017-12-07 ENCOUNTER — Encounter (HOSPITAL_COMMUNITY): Payer: Self-pay

## 2017-12-07 ENCOUNTER — Inpatient Hospital Stay (HOSPITAL_COMMUNITY): Payer: BLUE CROSS/BLUE SHIELD | Attending: Hematology

## 2017-12-07 VITALS — BP 117/68 | HR 51 | Temp 97.6°F | Resp 18

## 2017-12-07 DIAGNOSIS — C9000 Multiple myeloma not having achieved remission: Secondary | ICD-10-CM | POA: Diagnosis present

## 2017-12-07 DIAGNOSIS — D509 Iron deficiency anemia, unspecified: Secondary | ICD-10-CM | POA: Insufficient documentation

## 2017-12-07 DIAGNOSIS — N183 Chronic kidney disease, stage 3 unspecified: Secondary | ICD-10-CM

## 2017-12-07 MED ORDER — SODIUM CHLORIDE 0.9% FLUSH
10.0000 mL | Freq: Once | INTRAVENOUS | Status: AC
  Administered 2017-12-07: 10 mL via INTRAVENOUS

## 2017-12-07 MED ORDER — SODIUM CHLORIDE 0.9 % IV SOLN
INTRAVENOUS | Status: DC
  Administered 2017-12-07: 11:00:00 via INTRAVENOUS

## 2017-12-07 MED ORDER — SODIUM CHLORIDE 0.9 % IV SOLN
510.0000 mg | Freq: Once | INTRAVENOUS | Status: AC
  Administered 2017-12-07: 510 mg via INTRAVENOUS
  Filled 2017-12-07: qty 17

## 2017-12-07 NOTE — Progress Notes (Signed)
To treatment room for iron infusion.  Reviewed medication and printed information given.   All questions asked and answered.  No s/s of distress noted.   Patient tolerated iron infusion with no complaints voiced.  Peripheral IV site clean and dry with no bruising or swelling noted at site.  No complaints of pain at site.  Band aid applied.  VSS with discharge and left by wheelchair with family.  No s/s of distress noted.

## 2017-12-07 NOTE — Patient Instructions (Signed)

## 2017-12-10 NOTE — Patient Instructions (Addendum)
Your procedure is scheduled on: 12/16/2017  Report to [name] at  10:15   AM.  Call this number if you have problems the morning of surgery: [phone]   Remember:   Do not drink or eat food:After Midnight.  :  Take these medicines the morning of surgery with A SIP OF WATER: Dexilant, Gabapentin, and Claritin                           Take only 1/2 dose Levemir 5 units morning of surgery,  No oral diabetic medication am of surgery  Do not wear jewelry, make-up or nail polish.  Do not wear lotions, powders, or perfumes. You may wear deodorant.  Do not shave 48 hours prior to surgery. Men may shave face and neck.  Do not bring valuables to the hospital.  Contacts, dentures or bridgework may not be worn into surgery.  Leave suitcase in the car. After surgery it may be brought to your room.  For patients admitted to the hospital, checkout time is 11:00 AM the day of discharge.   Patients discharged the day of surgery will not be allowed to drive home.    Special Instructions: Shower using CHG night before surgery and shower the day of surgery use CHG.  Use special wash - you have one bottle of CHG for all showers.  You should use approximately 1/2 of the bottle for each shower.  Cystoscopy, Care After Refer to this sheet in the next few weeks. These instructions provide you with information about caring for yourself after your procedure. Your health care provider may also give you more specific instructions. Your treatment has been planned according to current medical practices, but problems sometimes occur. Call your health care provider if you have any problems or questions after your procedure. What can I expect after the procedure? After the procedure, it is common to have:  Mild pain when you urinate. Pain should stop within a few minutes after you urinate. This may last for up to 1 week.  A small amount of blood in your urine for several days.  Feeling like you need to  urinate but producing only a small amount of urine.  Follow these instructions at home:  Medicines  Take over-the-counter and prescription medicines only as told by your health care provider.  If you were prescribed an antibiotic medicine, take it as told by your health care provider. Do not stop taking the antibiotic even if you start to feel better. General instructions   Return to your normal activities as told by your health care provider. Ask your health care provider what activities are safe for you.  Do not drive for 24 hours if you received a sedative.  Watch for any blood in your urine. If the amount of blood in your urine increases, call your health care provider.  Follow instructions from your health care provider about eating or drinking restrictions.  If a tissue sample was removed for testing (biopsy) during your procedure, it is your responsibility to get your test results. Ask your health care provider or the department performing the test when your results will be ready.  Drink enough fluid to keep your urine clear or pale yellow.  Keep all follow-up visits as told by your health care provider. This is important. Contact a health care provider if:  You have pain that gets worse or does not get better with medicine, especially pain when  you urinate.  You have difficulty urinating. Get help right away if:  You have more blood in your urine.  You have blood clots in your urine.  You have abdominal pain.  You have a fever or chills.  You are unable to urinate. This information is not intended to replace advice given to you by your health care provider. Make sure you discuss any questions you have with your health care provider. Document Released: 12/06/2004 Document Revised: 10/25/2015 Document Reviewed: 04/05/2015 Elsevier Interactive Patient Education  2018 [place].  Ureteral Stent Implantation, Care After Refer to this sheet in the next few weeks.  These instructions provide you with information about caring for yourself after your procedure. Your health care provider may also give you more specific instructions. Your treatment has been planned according to current medical practices, but problems sometimes occur. Call your health care provider if you have any problems or questions after your procedure. What can I expect after the procedure? After the procedure, it is common to have:  Nausea.  Mild pain when you urinate. You may feel this pain in your lower back or lower abdomen. Pain should stop within a few minutes after you urinate. This may last for up to 1 week.  A small amount of blood in your urine for several days.  Follow these instructions at home:  Medicines  Take over-the-counter and prescription medicines only as told by your health care provider.  If you were prescribed an antibiotic medicine, take it as told by your health care provider. Do not stop taking the antibiotic even if you start to feel better.  Do not drive for 24 hours if you received a sedative.  Do not drive or operate heavy machinery while taking prescription pain medicines. Activity  Return to your normal activities as told by your health care provider. Ask your health care provider what activities are safe for you.  Do not lift anything that is heavier than 10 lb (4.5 kg). Follow this limit for 1 week after your procedure, or for as long as told by your health care provider. General instructions  Watch for any blood in your urine. Call your health care provider if the amount of blood in your urine increases.  If you have a catheter: ? Follow instructions from your health care provider about taking care of your catheter and collection bag. ? Do not take baths, swim, or use a hot tub until your health care provider approves.  Drink enough fluid to keep your urine clear or pale yellow.  Keep all follow-up visits as told by your health care  provider. This is important. Contact a health care provider if:  You have pain that gets worse or does not get better with medicine, especially pain when you urinate.  You have difficulty urinating.  You feel nauseous or you vomit repeatedly during a period of more than 2 days after the procedure. Get help right away if:  Your urine is dark red or has blood clots in it.  You are leaking urine (have incontinence).  The end of the stent comes out of your urethra.  You cannot urinate.  You have sudden, sharp, or severe pain in your abdomen or lower back.  You have a fever. This information is not intended to replace advice given to you by your health care provider. Make sure you discuss any questions you have with your health care provider. Document Released: 01/19/2013 Document Revised: 10/25/2015 Document Reviewed: 12/01/2014 Elsevier Interactive  Patient Education  2018 [place], Adult, Care After These instructions provide you with information about caring for yourself after your procedure. Your health care provider may also give you more specific instructions. Your treatment has been planned according to current medical practices, but problems sometimes occur. Call your health care provider if you have any problems or questions after your procedure. What can I expect after the procedure? After the procedure, it is common to have:  Vomiting.  A sore throat.  Mental slowness.  It is common to feel:  Nauseous.  Cold or shivery.  Sleepy.  Tired.  Sore or achy, even in parts of your body where you did not have surgery.  Follow these instructions at home: For at least 24 hours after the procedure:  Do not: ? Participate in activities where you could fall or become injured. ? Drive. ? Use heavy machinery. ? Drink alcohol. ? Take sleeping pills or medicines that cause drowsiness. ? Make important decisions or sign legal documents. ? Take care of  children on your own.  Rest. Eating and drinking  If you vomit, drink water, juice, or soup when you can drink without vomiting.  Drink enough fluid to keep your urine clear or pale yellow.  Make sure you have little or no nausea before eating solid foods.  Follow the diet recommended by your health care provider. General instructions  Have a responsible adult stay with you until you are awake and alert.  Return to your normal activities as told by your health care provider. Ask your health care provider what activities are safe for you.  Take over-the-counter and prescription medicines only as told by your health care provider.  If you smoke, do not smoke without supervision.  Keep all follow-up visits as told by your health care provider. This is important. Contact a health care provider if:  You continue to have nausea or vomiting at home, and medicines are not helpful.  You cannot drink fluids or start eating again.  You cannot urinate after 8-12 hours.  You develop a skin rash.  You have fever.  You have increasing redness at the site of your procedure. Get help right away if:  You have difficulty breathing.  You have chest pain.  You have unexpected bleeding.  You feel that you are having a life-threatening or urgent problem. This information is not intended to replace advice given to you by your health care provider. Make sure you discuss any questions you have with your health care provider. Document Released: 08/25/2000 Document Revised: 10/22/2015 Document Reviewed: 05/03/2015 Elsevier Interactive Patient Education  [employer].

## 2017-12-11 ENCOUNTER — Other Ambulatory Visit: Payer: Self-pay

## 2017-12-11 ENCOUNTER — Encounter (HOSPITAL_COMMUNITY)
Admission: RE | Admit: 2017-12-11 | Discharge: 2017-12-11 | Disposition: A | Discharge status: Home / Self Care | Payer: BLUE CROSS/BLUE SHIELD | Source: Ambulatory Visit | Attending: Urology | Admitting: Urology

## 2017-12-11 ENCOUNTER — Encounter (HOSPITAL_COMMUNITY): Payer: Self-pay

## 2017-12-11 DIAGNOSIS — D649 Anemia, unspecified: Secondary | ICD-10-CM | POA: Diagnosis not present

## 2017-12-11 DIAGNOSIS — I4892 Unspecified atrial flutter: Secondary | ICD-10-CM | POA: Insufficient documentation

## 2017-12-11 DIAGNOSIS — R600 Localized edema: Secondary | ICD-10-CM | POA: Insufficient documentation

## 2017-12-11 DIAGNOSIS — E785 Hyperlipidemia, unspecified: Secondary | ICD-10-CM | POA: Diagnosis not present

## 2017-12-11 DIAGNOSIS — K219 Gastro-esophageal reflux disease without esophagitis: Secondary | ICD-10-CM | POA: Insufficient documentation

## 2017-12-11 DIAGNOSIS — Z01812 Encounter for preprocedural laboratory examination: Secondary | ICD-10-CM | POA: Insufficient documentation

## 2017-12-11 DIAGNOSIS — E669 Obesity, unspecified: Secondary | ICD-10-CM | POA: Diagnosis not present

## 2017-12-11 DIAGNOSIS — N183 Chronic kidney disease, stage 3 (moderate): Secondary | ICD-10-CM | POA: Insufficient documentation

## 2017-12-11 DIAGNOSIS — I129 Hypertensive chronic kidney disease with stage 1 through stage 4 chronic kidney disease, or unspecified chronic kidney disease: Secondary | ICD-10-CM | POA: Insufficient documentation

## 2017-12-11 DIAGNOSIS — E1122 Type 2 diabetes mellitus with diabetic chronic kidney disease: Secondary | ICD-10-CM | POA: Insufficient documentation

## 2017-12-11 LAB — CBC
HCT: 32.1 % — ABNORMAL LOW (ref 39.0–52.0)
Hemoglobin: 10 g/dL — ABNORMAL LOW (ref 13.0–17.0)
MCH: 30.2 pg (ref 26.0–34.0)
MCHC: 31.2 g/dL (ref 30.0–36.0)
MCV: 97 fL (ref 78.0–100.0)
Platelets: 203 10*3/uL (ref 150–400)
RBC: 3.31 MIL/uL — ABNORMAL LOW (ref 4.22–5.81)
RDW: 16.4 % — ABNORMAL HIGH (ref 11.5–15.5)
WBC: 6 10*3/uL (ref 4.0–10.5)

## 2017-12-11 LAB — RENAL FUNCTION PANEL
Albumin: 3.2 g/dL — ABNORMAL LOW (ref 3.5–5.0)
Anion gap: 9 (ref 5–15)
BUN: 37 mg/dL — ABNORMAL HIGH (ref 6–20)
CO2: 22 mmol/L (ref 22–32)
Calcium: 8.8 mg/dL — ABNORMAL LOW (ref 8.9–10.3)
Chloride: 112 mmol/L — ABNORMAL HIGH (ref 98–111)
Creatinine, Ser: 1.8 mg/dL — ABNORMAL HIGH (ref 0.61–1.24)
GFR calc Af Amer: 46 mL/min — ABNORMAL LOW (ref >60)
GFR calc non Af Amer: 40 mL/min — ABNORMAL LOW (ref >60)
Glucose, Bld: 136 mg/dL — ABNORMAL HIGH (ref 70–99)
Phosphorus: 5.2 mg/dL — ABNORMAL HIGH (ref 2.5–4.6)
Potassium: 4 mmol/L (ref 3.5–5.1)
Sodium: 143 mmol/L (ref 135–145)

## 2017-12-11 LAB — BASIC METABOLIC PANEL
Anion gap: 9 (ref 5–15)
BUN: 36 mg/dL — ABNORMAL HIGH (ref 6–20)
CO2: 22 mmol/L (ref 22–32)
Calcium: 8.7 mg/dL — ABNORMAL LOW (ref 8.9–10.3)
Chloride: 111 mmol/L (ref 98–111)
Creatinine, Ser: 1.74 mg/dL — ABNORMAL HIGH (ref 0.61–1.24)
GFR calc Af Amer: 48 mL/min — ABNORMAL LOW (ref >60)
GFR calc non Af Amer: 41 mL/min — ABNORMAL LOW (ref >60)
Glucose, Bld: 137 mg/dL — ABNORMAL HIGH (ref 70–99)
Potassium: 4 mmol/L (ref 3.5–5.1)
Sodium: 142 mmol/L (ref 135–145)

## 2017-12-11 LAB — GLUCOSE, CAPILLARY: Glucose-Capillary: 124 mg/dL — ABNORMAL HIGH (ref 70–99)

## 2017-12-11 LAB — IRON AND TIBC
Iron: 86 ug/dL (ref 45–182)
Saturation Ratios: 27 % (ref 17.9–39.5)
TIBC: 315 ug/dL (ref 250–450)
UIBC: 229 ug/dL

## 2017-12-11 LAB — FERRITIN: Ferritin: 229 ng/mL (ref 24–336)

## 2017-12-12 LAB — PTH, INTACT AND CALCIUM
Calcium, Total (PTH): 8.8 mg/dL (ref 8.7–10.2)
PTH: 20 pg/mL (ref 15–65)

## 2017-12-12 LAB — VITAMIN D 25 HYDROXY (VIT D DEFICIENCY, FRACTURES): Vit D, 25-Hydroxy: 52.5 ng/mL (ref 30.0–100.0)

## 2017-12-14 ENCOUNTER — Encounter (HOSPITAL_COMMUNITY): Payer: Self-pay

## 2017-12-14 ENCOUNTER — Inpatient Hospital Stay (HOSPITAL_COMMUNITY): Payer: BLUE CROSS/BLUE SHIELD

## 2017-12-14 VITALS — BP 130/86 | HR 60 | Temp 97.9°F | Resp 18 | Wt 346.8 lb

## 2017-12-14 DIAGNOSIS — N183 Chronic kidney disease, stage 3 unspecified: Secondary | ICD-10-CM

## 2017-12-14 DIAGNOSIS — C9001 Multiple myeloma in remission: Secondary | ICD-10-CM | POA: Diagnosis not present

## 2017-12-14 MED ORDER — FERUMOXYTOL INJECTION 510 MG/17 ML
510.0000 mg | Freq: Once | INTRAVENOUS | Status: AC
  Administered 2017-12-14: 510 mg via INTRAVENOUS
  Filled 2017-12-14: qty 17

## 2017-12-14 MED ORDER — SODIUM CHLORIDE 0.9% FLUSH
10.0000 mL | Freq: Once | INTRAVENOUS | Status: AC
  Administered 2017-12-14: 10 mL via INTRAVENOUS

## 2017-12-14 MED ORDER — SODIUM CHLORIDE 0.9 % IV SOLN
INTRAVENOUS | Status: DC
  Administered 2017-12-14: 12:00:00 via INTRAVENOUS

## 2017-12-14 NOTE — Progress Notes (Signed)
Patient tolerated iron infusion with no complaints voiced.  Peripheral IV site clean and dry with no bruising, swelling, or complaints of pain at site.  Good blood return noted before and after administration of iron.  Band aid applied.  VSS with discharge and left by wheelchair with family.  No s/s of distress noted.

## 2017-12-14 NOTE — Patient Instructions (Signed)
[facility] at [facility]  Discharge Instructions:  You received an iron infusion today.  _______________________________________________________________  Thank you for choosing [facility] at [facility] to provide your oncology and hematology care.  To afford each patient quality time with our providers, please arrive at least 15 minutes before your scheduled appointment.  You need to re-schedule your appointment if you arrive 10 or more minutes late.  We strive to give you quality time with our providers, and arriving late affects you and other patients whose appointments are after yours.  Also, if you no show three or more times for appointments you may be dismissed from the clinic.  Again, thank you for choosing [facility] at [facility]. Our hope is that these requests will allow you access to exceptional care and in a timely manner. _______________________________________________________________  If you have questions after your visit, please contact our office at [phone] between the hours of 8:30 a.m. and 5:00 p.m. Voicemails left after 4:30 p.m. will not be returned until the following business day. _______________________________________________________________  For prescription refill requests, have your pharmacy contact our office. _______________________________________________________________  Recommendations made by the consultant and any test results will be sent to your referring physician. _______________________________________________________________ 

## 2017-12-16 ENCOUNTER — Encounter (HOSPITAL_COMMUNITY): Payer: Self-pay | Admitting: *Deleted

## 2017-12-16 ENCOUNTER — Ambulatory Visit (HOSPITAL_COMMUNITY)
Admission: RE | Admit: 2017-12-16 | Discharge: 2017-12-16 | Disposition: A | Discharge status: Home / Self Care | Payer: BLUE CROSS/BLUE SHIELD | Source: Ambulatory Visit | Attending: Urology | Admitting: Urology

## 2017-12-16 ENCOUNTER — Ambulatory Visit (HOSPITAL_COMMUNITY): Payer: BLUE CROSS/BLUE SHIELD

## 2017-12-16 ENCOUNTER — Encounter (HOSPITAL_COMMUNITY)
Admission: RE | Disposition: A | Discharge status: Home / Self Care | Payer: Self-pay | Source: Ambulatory Visit | Attending: Urology

## 2017-12-16 ENCOUNTER — Ambulatory Visit (HOSPITAL_COMMUNITY): Payer: BLUE CROSS/BLUE SHIELD | Admitting: Anesthesiology

## 2017-12-16 DIAGNOSIS — E669 Obesity, unspecified: Secondary | ICD-10-CM | POA: Insufficient documentation

## 2017-12-16 DIAGNOSIS — G4730 Sleep apnea, unspecified: Secondary | ICD-10-CM | POA: Insufficient documentation

## 2017-12-16 DIAGNOSIS — I252 Old myocardial infarction: Secondary | ICD-10-CM | POA: Insufficient documentation

## 2017-12-16 DIAGNOSIS — M1990 Unspecified osteoarthritis, unspecified site: Secondary | ICD-10-CM

## 2017-12-16 DIAGNOSIS — E785 Hyperlipidemia, unspecified: Secondary | ICD-10-CM

## 2017-12-16 DIAGNOSIS — Z955 Presence of coronary angioplasty implant and graft: Secondary | ICD-10-CM | POA: Insufficient documentation

## 2017-12-16 DIAGNOSIS — Z91018 Allergy to other foods: Secondary | ICD-10-CM | POA: Insufficient documentation

## 2017-12-16 DIAGNOSIS — N183 Chronic kidney disease, stage 3 (moderate): Secondary | ICD-10-CM

## 2017-12-16 DIAGNOSIS — Z794 Long term (current) use of insulin: Secondary | ICD-10-CM | POA: Insufficient documentation

## 2017-12-16 DIAGNOSIS — Z87891 Personal history of nicotine dependence: Secondary | ICD-10-CM

## 2017-12-16 DIAGNOSIS — Z6841 Body Mass Index (BMI) 40.0 and over, adult: Secondary | ICD-10-CM

## 2017-12-16 DIAGNOSIS — I1310 Hypertensive heart and chronic kidney disease without heart failure, with stage 1 through stage 4 chronic kidney disease, or unspecified chronic kidney disease: Secondary | ICD-10-CM

## 2017-12-16 DIAGNOSIS — E1122 Type 2 diabetes mellitus with diabetic chronic kidney disease: Secondary | ICD-10-CM

## 2017-12-16 DIAGNOSIS — Z711 Person with feared health complaint in whom no diagnosis is made: Secondary | ICD-10-CM

## 2017-12-16 DIAGNOSIS — N201 Calculus of ureter: Secondary | ICD-10-CM | POA: Diagnosis not present

## 2017-12-16 DIAGNOSIS — Z466 Encounter for fitting and adjustment of urinary device: Secondary | ICD-10-CM

## 2017-12-16 DIAGNOSIS — I2510 Atherosclerotic heart disease of native coronary artery without angina pectoris: Secondary | ICD-10-CM

## 2017-12-16 DIAGNOSIS — I4892 Unspecified atrial flutter: Secondary | ICD-10-CM

## 2017-12-16 HISTORY — PX: URETEROSCOPY: SHX842

## 2017-12-16 HISTORY — PX: CYSTOSCOPY W/ RETROGRADES: SHX1426

## 2017-12-16 LAB — GLUCOSE, CAPILLARY
Glucose-Capillary: 121 mg/dL — ABNORMAL HIGH (ref 70–99)
Glucose-Capillary: 111 mg/dL — ABNORMAL HIGH (ref 70–99)

## 2017-12-16 SURGERY — URETEROSCOPY
Anesthesia: General | Site: Ureter | Laterality: Right

## 2017-12-16 MED ORDER — PROPOFOL 10 MG/ML IV BOLUS
INTRAVENOUS | Status: DC | PRN
  Administered 2017-12-16: 160 mg via INTRAVENOUS

## 2017-12-16 MED ORDER — CEFTRIAXONE SODIUM 2 G IJ SOLR
2.0000 g | INTRAMUSCULAR | Status: AC
  Administered 2017-12-16: 2 g via INTRAVENOUS

## 2017-12-16 MED ORDER — LIDOCAINE HCL 1 % IJ SOLN
INTRAMUSCULAR | Status: DC | PRN
  Administered 2017-12-16: 25 mg via INTRADERMAL

## 2017-12-16 MED ORDER — SODIUM CHLORIDE 0.9 % IV SOLN
INTRAVENOUS | Status: AC
  Filled 2017-12-16: qty 20

## 2017-12-16 MED ORDER — SODIUM CHLORIDE 0.9 % IR SOLN
Status: DC | PRN
  Administered 2017-12-16 (×2): 3000 mL

## 2017-12-16 MED ORDER — SODIUM CHLORIDE 0.9 % IJ SOLN
INTRAMUSCULAR | Status: AC
  Filled 2017-12-16: qty 10

## 2017-12-16 MED ORDER — FENTANYL CITRATE (PF) 100 MCG/2ML IJ SOLN
INTRAMUSCULAR | Status: DC | PRN
  Administered 2017-12-16 (×2): 25 ug via INTRAVENOUS

## 2017-12-16 MED ORDER — LACTATED RINGERS IV SOLN
INTRAVENOUS | Status: DC
  Administered 2017-12-16: 12:00:00 via INTRAVENOUS

## 2017-12-16 MED ORDER — OXYCODONE-ACETAMINOPHEN 5-325 MG PO TABS: 1.0000 | ORAL_TABLET | ORAL | 0 refills | Status: DC | PRN

## 2017-12-16 MED ORDER — PROPOFOL 10 MG/ML IV BOLUS
INTRAVENOUS | Status: AC
  Filled 2017-12-16: qty 40

## 2017-12-16 MED ORDER — EPHEDRINE SULFATE 50 MG/ML IJ SOLN
INTRAMUSCULAR | Status: AC
  Filled 2017-12-16: qty 1

## 2017-12-16 MED ORDER — FENTANYL CITRATE (PF) 250 MCG/5ML IJ SOLN
INTRAMUSCULAR | Status: AC
  Filled 2017-12-16: qty 5

## 2017-12-16 MED ORDER — HYDROCODONE-ACETAMINOPHEN 7.5-325 MG PO TABS: 1.0000 | ORAL_TABLET | Freq: Once | ORAL | Status: DC | PRN

## 2017-12-16 MED ORDER — MIDAZOLAM HCL 2 MG/2ML IJ SOLN
INTRAMUSCULAR | Status: AC
  Filled 2017-12-16: qty 2

## 2017-12-16 MED ORDER — FENTANYL CITRATE (PF) 100 MCG/2ML IJ SOLN: 25.0000 ug | INTRAMUSCULAR | Status: DC | PRN

## 2017-12-16 MED ORDER — ARTIFICIAL TEARS OPHTHALMIC OINT
TOPICAL_OINTMENT | OPHTHALMIC | Status: AC
  Filled 2017-12-16: qty 7

## 2017-12-16 MED ORDER — DIATRIZOATE MEGLUMINE 30 % UR SOLN
URETHRAL | Status: AC
  Filled 2017-12-16: qty 100

## 2017-12-16 MED ORDER — DIATRIZOATE MEGLUMINE 30 % UR SOLN
URETHRAL | Status: DC | PRN
  Administered 2017-12-16: 10 mL via URETHRAL

## 2017-12-16 MED ORDER — WATER FOR IRRIGATION, STERILE IR SOLN
Status: DC | PRN
  Administered 2017-12-16: 1000 mL

## 2017-12-16 MED ORDER — MIDAZOLAM HCL 5 MG/5ML IJ SOLN
INTRAMUSCULAR | Status: DC | PRN
  Administered 2017-12-16: 2 mg via INTRAVENOUS

## 2017-12-16 MED ORDER — LIDOCAINE HCL (PF) 1 % IJ SOLN
INTRAMUSCULAR | Status: AC
  Filled 2017-12-16: qty 5

## 2017-12-16 SURGICAL SUPPLY — 23 items
BAG DRAIN URO TABLE W/ADPT NS (BAG) ×3 IMPLANT
BAG DRN 8 ADPR NS SKTRN CSTL (BAG) ×2
CATH INTERMIT  6FR 70CM (CATHETERS) ×3 IMPLANT
CLOTH BEACON ORANGE TIMEOUT ST (SAFETY) ×3 IMPLANT
DECANTER SPIKE VIAL GLASS SM (MISCELLANEOUS) ×3 IMPLANT
GLOVE BIO SURGEON STRL SZ8 (GLOVE) ×3 IMPLANT
GLOVE BIOGEL PI IND STRL 7.0 (GLOVE) ×4 IMPLANT
GLOVE BIOGEL PI INDICATOR 7.0 (GLOVE) ×2
GLOVE ECLIPSE 6.5 STRL STRAW (GLOVE) ×2 IMPLANT
GOWN STRL REUS W/ TWL XL LVL3 (GOWN DISPOSABLE) ×2 IMPLANT
GOWN STRL REUS W/TWL LRG LVL3 (GOWN DISPOSABLE) ×3 IMPLANT
GOWN STRL REUS W/TWL XL LVL3 (GOWN DISPOSABLE) ×3
GUIDEWIRE STR DUAL SENSOR (WIRE) ×3 IMPLANT
GUIDEWIRE STR ZIPWIRE 035X150 (MISCELLANEOUS) ×3 IMPLANT
IV NS IRRIG 3000ML ARTHROMATIC (IV SOLUTION) ×6 IMPLANT
KIT TURNOVER CYSTO (KITS) ×3 IMPLANT
MANIFOLD NEPTUNE II (INSTRUMENTS) ×3 IMPLANT
PACK CYSTO (CUSTOM PROCEDURE TRAY) ×3 IMPLANT
PAD ARMBOARD 7.5X6 YLW CONV (MISCELLANEOUS) ×3 IMPLANT
SYR 10ML LL (SYRINGE) ×3 IMPLANT
TOWEL OR 17X26 4PK STRL BLUE (TOWEL DISPOSABLE) ×3 IMPLANT
WATER STERILE IRR 1000ML UROMA (IV SOLUTION) ×3 IMPLANT
WATER STERILE IRR 500ML POUR (IV SOLUTION) ×3 IMPLANT

## 2017-12-16 NOTE — Anesthesia Postprocedure Evaluation (Signed)
Anesthesia Post Note  Patient: [name]  Procedure(s) Performed: Diagnostic URETEROSCOPY (Right Ureter) CYSTOSCOPY WITH RETROGRADE PYELOGRAM AND STENT REMOVAL (Right Ureter)  Patient location during evaluation: PACU Anesthesia Type: General Level of consciousness: awake and patient cooperative Pain management: pain level controlled Vital Signs Assessment: post-procedure vital signs reviewed and stable Respiratory status: spontaneous breathing, nonlabored ventilation and respiratory function stable Cardiovascular status: blood pressure returned to baseline Postop Assessment: no apparent nausea or vomiting Anesthetic complications: no     Last Vitals:  Vitals:   12/16/17 1134  Temp: 36.9 C    Last Pain:  Vitals:   12/16/17 1134  TempSrc: Oral  PainSc: 4                  Ricky Doan J

## 2017-12-16 NOTE — Discharge Instructions (Signed)
Ureteroscopy, Care After °This sheet gives you information about how to care for yourself after your procedure. Your health care provider may also give you more specific instructions. If you have problems or questions, contact your health care provider. °What can I expect after the procedure? °After the procedure, it is common to have: °· A burning sensation when you urinate. °· Blood in your urine. °· Mild discomfort in the bladder area or kidney area when urinating. °· Needing to urinate more often or urgently. ° °Follow these instructions at home: °Medicines °· Take over-the-counter and prescription medicines only as told by your health care provider. °· If you were prescribed an antibiotic medicine, take it as told by your health care provider. Do not stop taking the antibiotic even if you start to feel better. °General instructions ° °· Do not drive for 24 hours if you were given a medicine to help you relax (sedative) during your procedure. °· To relieve burning, try taking a warm bath or holding a warm washcloth over your groin. °· Drink enough fluid to keep your urine clear or pale yellow. °? Drink two 8-ounce glasses of water every hour for the first 2 hours after you get home. °? Continue to drink water often at home. °· You can eat what you usually do. °· Keep all follow-up visits as told by your health care provider. This is important. °? If you had a tube placed to keep urine flowing (ureteral stent), ask your health care provider when you need to return to have it removed. °Contact a health care provider if: °· You have chills or a fever. °· You have burning pain for longer than 24 hours after the procedure. °· You have blood in your urine for longer than 24 hours after the procedure. °Get help right away if: °· You have large amounts of blood in your urine. °· You have blood clots in your urine. °· You have very bad pain. °· You have chest pain or trouble breathing. °· You are unable to urinate and  you have the feeling of a full bladder. °This information is not intended to replace advice given to you by your health care provider. Make sure you discuss any questions you have with your health care provider. °Document Released: 05/24/2013 Document Revised: 03/04/2016 Document Reviewed: 02/29/2016 °Elsevier Interactive Patient Education © 2018 Elsevier Inc[employer] ° °

## 2017-12-16 NOTE — Anesthesia Procedure Notes (Signed)
Procedure Name: LMA Insertion Date/Time: 12/16/2017 12:34 PM Performed by: Charmaine Downs, CRNA Pre-anesthesia Checklist: Patient identified, Patient being monitored, Emergency Drugs available, Timeout performed and Suction available Patient Re-evaluated:Patient Re-evaluated prior to induction Oxygen Delivery Method: Circle System Utilized Preoxygenation: Pre-oxygenation with 100% oxygen Induction Type: IV induction Ventilation: Mask ventilation without difficulty LMA: LMA inserted LMA Size: 5.0 Number of attempts: 1 Placement Confirmation: positive ETCO2 and breath sounds checked- equal and bilateral Tube secured with: Tape

## 2017-12-16 NOTE — Anesthesia Preprocedure Evaluation (Signed)
Anesthesia Evaluation  Patient identified by MRN, date of birth, ID band Patient awake    Reviewed: Allergy & Precautions, NPO status , Patient's Chart, lab work & pertinent test results  Airway Mallampati: II  TM Distance: >3 FB Neck ROM: Full    Dental no notable dental hx. (+) Teeth Intact   Pulmonary neg pulmonary ROS, sleep apnea , pneumonia, resolved, former smoker,    Pulmonary exam normal breath sounds clear to auscultation       Cardiovascular Exercise Tolerance: Poor hypertension, + Past MI and +CHF  negative cardio ROS Normal cardiovascular exam(-) dysrhythmias II Rhythm:Regular Rate:Normal  BMI >44 States has been somewhat bedridden -uses walker  Has had mult infections    Neuro/Psych  Neuromuscular disease negative neurological ROS  negative psych ROS   GI/Hepatic negative GI ROS, Neg liver ROS, GERD  Medicated and Controlled,  Endo/Other  negative endocrine ROSdiabetes, Well Controlled, Type 1, Insulin Dependent  Renal/GU negative Renal ROS  negative genitourinary   Musculoskeletal negative musculoskeletal ROS (+) Arthritis , Osteoarthritis,    Abdominal   Peds negative pediatric ROS (+)  Hematology negative hematology ROS (+) anemia ,   Anesthesia Other Findings   Reproductive/Obstetrics negative OB ROS                             Anesthesia Physical Anesthesia Plan  ASA: IV  Anesthesia Plan: General   Post-op Pain Management:    Induction: Intravenous  PONV Risk Score and Plan:   Airway Management Planned: Oral ETT  Additional Equipment:   Intra-op Plan:   Post-operative Plan: Extubation in OR  Informed Consent: I have reviewed the patients History and Physical, chart, labs and discussed the procedure including the risks, benefits and alternatives for the proposed anesthesia with the patient or authorized representative who has indicated his/her  understanding and acceptance.   Dental advisory given  Plan Discussed with: CRNA  Anesthesia Plan Comments: (LMA vs GETA - given his BMI probable ETT)        Anesthesia Quick Evaluation

## 2017-12-16 NOTE — Transfer of Care (Signed)
Immediate Anesthesia Transfer of Care Note  Patient: [name]  Procedure(s) Performed: Diagnostic URETEROSCOPY (Right Ureter) CYSTOSCOPY WITH RETROGRADE PYELOGRAM AND STENT REMOVAL (Right Ureter)  Patient Location: PACU  Anesthesia Type:General  Level of Consciousness: awake and patient cooperative  Airway & Oxygen Therapy: Patient Spontanous Breathing and Patient connected to nasal cannula oxygen  Post-op Assessment: Report given to RN, Post -op Vital signs reviewed and stable and Patient moving all extremities  Post vital signs: Reviewed and stable  Last Vitals:  Vitals Value Taken Time  BP    Temp    Pulse    Resp    SpO2      Last Pain:  Vitals:   12/16/17 1134  TempSrc: Oral  PainSc: 4       Patients Stated Pain Goal: 7 (10/62/69 [id number])  Complications: No apparent anesthesia complications

## 2017-12-16 NOTE — Op Note (Signed)
Preoperative diagnosis: Right ureteral stone  Postoperative diagnosis: no stone visualized  Procedure: 1 cystoscopy 2 right retrograde pyelography 3.  Intraoperative fluoroscopy, under one hour, with interpretation 4.  Right diagnostic ureteroscopy 5.  Right 6 x 26 JJ stent removal  Attending: Rosie Fate  Anesthesia: General  Estimated blood loss: None  Drains: none  Specimens: none  Antibiotics: rocephin  Findings: ureteral orifices in normal anatomic location. No right hydronephrosis. No stone found in ureter or right kidney.  Indications: Patient is a 57 year old male with a history of ureteral stone and who underwent stent placement for sepsis.  After discussing treatment options, she decided proceed with right ureteroscopic stone manipulation.  Procedure her in detail: The patient was brought to the operating room and a brief timeout was done to ensure correct patient, correct procedure, correct site.  General anesthesia was administered patient was placed in dorsal lithotomy position.  Her genitalia was then prepped and draped in usual sterile fashion.  A rigid 48 French cystoscope was passed in the urethra and the bladder.  Bladder was inspected free masses or lesions.  the right ureteral orifices were in the normal orthotopic locations. Using a grasper the right ureteral stent was brought to the urethral meatus. We then advanced a zipwire through the stent and up to the renal pelvis. a 6 french ureteral catheter was then instilled into the right ureter orifice.  a gentle retrograde was obtained and findings noted above.   we then removed the cystoscope and cannulated the right ureteral orifice with a semirigid ureteroscope.  We performed ureteroscopy up to the UPJ and encountered no stone. We then advanced a sensor wire through the scope and into the renal pelvis. We then removed the scope and advanced a flexible ureteroscope over the wire up to the renal pelvis. We performed  nephroscopy and noted no stone. We then removed the scope. We did not elect to leave a stent since this was an uncomplicated ureteroscopy.  the bladder was then drained and this concluded the procedure which was well tolerated by patient.  Complications: None  Condition: Stable, extubated, transferred to PACU  Plan: Patient is to be discharged home as to follow-up in 2 weeks.

## 2017-12-16 NOTE — H&P (Signed)
Urology Admission H&P  Chief Complaint: right ureteral calculus  History of Present Illness: [name] is a 57yo with nephrolithiasis who underwent stent placement for right ureteral calculus and sepsis. He has been readmitted multiple times since the stent placement for sepsis. NO current fevers/chills/sweats. No LUTS  Past Medical History:  Diagnosis Date  . Abscess 04/2017   Scrotal  . Anemia   . Arteriosclerotic cardiovascular disease (ASCVD)    MI-2000s; stent to the proximal LAD and diagonal in 2001; stress nuclear in 2008-impaired exercise capacity, left ventricular dilatation, moderately to severely depressed EF, apical, inferior and anteroseptal scar  . Arthritis   . Atrial flutter ([place])   . 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 Cellulitis of leg    both legs  . Chronic diarrhea   . Chronic kidney disease, stage 3, mod decreased GFR (HCC)    Creatinine of 1.84 in 06/2011 and 1.5 in 07/2011  . Diabetes mellitus    Insulin  . Dysrhythmia    AFlutter  . GERD (gastroesophageal reflux disease)   . Gout   . Hyperlipidemia   . Hypertension   . Injection site reaction   . Multiple myeloma 07/01/2011  . Myocardial infarction ([place]) 2000  . Obesity   . Pedal edema    Venous insufficiency  . Sleep apnea    uses cpap  . Ulcer    Past Surgical History:  Procedure Laterality Date  . ABSCESS DRAINAGE     Scrotal  . BIOPSY  01/02/2012   Procedure: BIOPSY;  Surgeon: Rogene Houston, MD;  [facility];  Service: Endoscopy;  Laterality: N/A;  . BONE MARROW BIOPSY  05/13/11  . CARDIAC CATHETERIZATION     cardiac stent  . CARDIOVERSION N/A 10/13/2012   Procedure: CARDIOVERSION;  Surgeon: Yehuda Savannah, MD;  Location: [facility];  Service: Cardiovascular;  Laterality: N/A;  . CATARACT EXTRACTION W/PHACO Left 02/13/2014   Procedure: CATARACT EXTRACTION PHACO AND INTRAOCULAR LENS PLACEMENT ([place]);  Surgeon: Tonny Branch, MD;  Location: [facility];  Service: Ophthalmology;   Laterality: Left;  CDE:  7.67  . CATARACT EXTRACTION W/PHACO Right 03/02/2014   Procedure: CATARACT EXTRACTION PHACO AND INTRAOCULAR LENS PLACEMENT RIGHT EYE CDE=16.81;  Surgeon: Tonny Branch, MD;  Location: [facility];  Service: Ophthalmology;  Laterality: Right;  . COLONOSCOPY  11/28/2011   Procedure: COLONOSCOPY;  Surgeon: Rogene Houston, MD;  [facility];  Service: Endoscopy;  Laterality: N/A;  930  . CYSTOSCOPY W/ URETERAL STENT PLACEMENT Right 07/31/2017   Procedure: CYSTOSCOPY WITH RETROGRADE PYELOGRAM/URETERAL STENT PLACEMENT;  Surgeon: Cleon Gustin, MD;  Location: [facility];  Service: Urology;  Laterality: Right;  . ESOPHAGOGASTRODUODENOSCOPY  01/02/2012   Procedure: ESOPHAGOGASTRODUODENOSCOPY (EGD);  Surgeon: Rogene Houston, MD;  [facility];  Service: Endoscopy;  Laterality: N/A;  100  . ESOPHAGOGASTRODUODENOSCOPY N/A 09/20/2012   Procedure: ESOPHAGOGASTRODUODENOSCOPY (EGD);  Surgeon: Rogene Houston, MD;  [facility];  Service: Endoscopy;  Laterality: N/A;  . EUS N/A 10/07/2012   Procedure: UPPER ENDOSCOPIC ULTRASOUND (EUS) LINEAR;  Surgeon: Milus Banister, MD;  [facility];  Service: Endoscopy;  Laterality: N/A;  . INCISION AND DRAINAGE ABSCESS N/A 04/14/2017   Procedure: INCISION AND DRAINAGE ABSCESS;  Surgeon: Ceasar Mons, MD;  [facility];  Service: Urology;  Laterality: N/A;  . INCISION AND DRAINAGE ABSCESS ANAL    . IRRIGATION AND DEBRIDEMENT ABSCESS N/A 04/15/2017   Procedure: DEBRIDEMENT SCROTAL WOUND AND DRESSING CHANGE;  Surgeon: Lovena Neighbours,  Conception Oms, MD;  Location: [facility];  Service: Urology;  Laterality: N/A;  . IRRIGATION AND DEBRIDEMENT ABSCESS N/A 04/17/2017   Procedure: IRRIGATION AND DEBRIDEMENT ABSCESS;  Surgeon: Ceasar Mons, MD;  [facility];  Service: Urology;  Laterality: N/A;  RM 3  . LAPAROSCOPIC GASTRIC BANDING  2006   has been removed  . OSTECTOMY Right 04/08/2017   Procedure:  OSTECTOMY RIGHT GREAT TOE;  Surgeon: Caprice Beaver, DPM;  [facility];  Service: Podiatry;  Laterality: Right;  . PORT-A-CATH REMOVAL Left 12/07/2012   Procedure: REMOVAL PORT-A-CATH;  Surgeon: Scherry Ran, MD;  Location: [facility];  Service: General;  Laterality: Left;  . PORT-A-CATH REMOVAL N/A 10/02/2017   Procedure: MINOR REMOVAL PORT-A-CATH AT BEDSIDE;  Surgeon: Donnie Mesa, MD;  Location: [facility];  Service: General;  Laterality: N/A;  . PORTACATH PLACEMENT  07/07/2011   Procedure: INSERTION PORT-A-CATH;  Surgeon: Scherry Ran, MD;  [facility];  Service: General;  Laterality: N/A;  . PORTACATH PLACEMENT N/A 12/07/2012   Procedure: INSERTION PORT-A-CATH;  Surgeon: Scherry Ran, MD;  [facility]: [facility];  Service: General;  Laterality: N/A;  Attempted portacath placement on left and right side  . WOUND DEBRIDEMENT Right 04/08/2017   Procedure: EXCISION ULCERATION RIGHT GREAT TOE;  Surgeon: Caprice Beaver, DPM;  Location: [facility];  Service: Podiatry;  Laterality: Right;  . WRIST SURGERY     Left; removal of bone fragment    Home Medications:  Current Facility-Administered Medications  Medication Dose Route Frequency Provider Last Rate Last Dose  . cefTRIAXone (ROCEPHIN) 2 g in sodium chloride 0.9 % 100 mL IVPB  2 g Intravenous 30 min Pre-Op Athene Schuhmacher, Candee Furbish, MD       Facility-Administered Medications Ordered in Other Encounters  Medication Dose Route Frequency Provider Last Rate Last Dose  . sodium chloride 0.9 % injection 10 mL  10 mL Intravenous Once Farrel Gobble, MD       Allergies:  Allergies  Allergen Reactions  . Beef-Derived Products Diarrhea  . Pork-Derived Products Diarrhea    Family History  Problem Relation Age of Onset  . Heart disease Mother   . Cancer Mother   . Diabetes Father   . Arthritis Unknown   . Anesthesia problems Neg Hx   . Hypotension Neg Hx   . Malignant hyperthermia Neg Hx   . Pseudochol deficiency Neg Hx    Social  History:  reports that he quit smoking about 16 years ago. His smoking use included cigarettes and cigars. He has a 0.25 pack-year smoking history. He has never used smokeless tobacco. He reports that he does not drink alcohol or use drugs.  Review of Systems  All other systems reviewed and are negative.   Physical Exam:  Vital signs in last 24 hours: Temp:  [98.5 F (36.9 C)] 98.5 F (36.9 C) (07/17 1134) Physical Exam  Constitutional: He is oriented to person, place, and time. He appears well-developed.  HENT:  Head: Normocephalic and atraumatic.  Eyes: Pupils are equal, round, and reactive to light. EOM are normal.  Neck: Normal range of motion. No thyromegaly present.  Cardiovascular: Normal rate and regular rhythm.  Respiratory: Effort normal. No respiratory distress.  GI: Soft. He exhibits no distension.  Musculoskeletal: Normal range of motion. He exhibits no edema.  Neurological: He is alert and oriented to person, place, and time.  Skin: Skin is warm and dry.  Psychiatric: He has a normal mood and affect. His behavior is normal.  Judgment and thought content normal.    Laboratory Data:  Results for orders placed or performed during the hospital encounter of 12/16/17 (from the past 24 hour(s))  Glucose, capillary     Status: Abnormal   Collection Time: 12/16/17 11:08 AM  Result Value Ref Range   Glucose-Capillary 121 (H) 70 - 99 mg/dL   *Note: Due to a large number of results and/or encounters for the requested time period, some results have not been displayed. A complete set of results can be found in Results Review.   No results found for this or any previous visit (from the past 240 hour(s)). Creatinine: Recent Labs    12/11/17 0807 12/11/17 0940  CREATININE 1.74* 1.80*   Baseline Creatinine: 1.7  Impression/Assessment:  56yo with right ureteral calculus  Plan:  The risks/benefits/alternatives to right ureteroscopic stone extraction was explained to the  patient and he understands and wishes to proceed with surgery  Nicolette Bang 12/16/2017, 11:57 AM

## 2017-12-17 ENCOUNTER — Inpatient Hospital Stay (HOSPITAL_COMMUNITY): Payer: BLUE CROSS/BLUE SHIELD

## 2017-12-17 ENCOUNTER — Inpatient Hospital Stay (HOSPITAL_COMMUNITY)
Admission: EM | Admit: 2017-12-17 | Discharge: 2017-12-21 | DRG: 862 | Disposition: A | Discharge status: Home Health | Payer: BLUE CROSS/BLUE SHIELD | Attending: Internal Medicine | Admitting: Internal Medicine

## 2017-12-17 ENCOUNTER — Emergency Department (HOSPITAL_COMMUNITY): Payer: BLUE CROSS/BLUE SHIELD

## 2017-12-17 ENCOUNTER — Other Ambulatory Visit: Payer: Self-pay

## 2017-12-17 ENCOUNTER — Encounter (HOSPITAL_COMMUNITY): Payer: Self-pay | Admitting: Urology

## 2017-12-17 DIAGNOSIS — I483 Typical atrial flutter: Secondary | ICD-10-CM

## 2017-12-17 DIAGNOSIS — Z91018 Allergy to other foods: Secondary | ICD-10-CM

## 2017-12-17 DIAGNOSIS — Z6841 Body Mass Index (BMI) 40.0 and over, adult: Secondary | ICD-10-CM | POA: Diagnosis not present

## 2017-12-17 DIAGNOSIS — Z794 Long term (current) use of insulin: Secondary | ICD-10-CM | POA: Diagnosis not present

## 2017-12-17 DIAGNOSIS — N189 Chronic kidney disease, unspecified: Secondary | ICD-10-CM

## 2017-12-17 DIAGNOSIS — Z9841 Cataract extraction status, right eye: Secondary | ICD-10-CM

## 2017-12-17 DIAGNOSIS — I872 Venous insufficiency (chronic) (peripheral): Secondary | ICD-10-CM | POA: Diagnosis present

## 2017-12-17 DIAGNOSIS — I4892 Unspecified atrial flutter: Secondary | ICD-10-CM | POA: Diagnosis present

## 2017-12-17 DIAGNOSIS — I2510 Atherosclerotic heart disease of native coronary artery without angina pectoris: Secondary | ICD-10-CM | POA: Diagnosis present

## 2017-12-17 DIAGNOSIS — K580 Irritable bowel syndrome with diarrhea: Secondary | ICD-10-CM | POA: Diagnosis present

## 2017-12-17 DIAGNOSIS — R748 Abnormal levels of other serum enzymes: Secondary | ICD-10-CM

## 2017-12-17 DIAGNOSIS — G4733 Obstructive sleep apnea (adult) (pediatric): Secondary | ICD-10-CM | POA: Diagnosis present

## 2017-12-17 DIAGNOSIS — N183 Chronic kidney disease, stage 3 unspecified: Secondary | ICD-10-CM | POA: Diagnosis present

## 2017-12-17 DIAGNOSIS — N390 Urinary tract infection, site not specified: Secondary | ICD-10-CM | POA: Diagnosis present

## 2017-12-17 DIAGNOSIS — N179 Acute kidney failure, unspecified: Secondary | ICD-10-CM | POA: Diagnosis present

## 2017-12-17 DIAGNOSIS — I472 Ventricular tachycardia: Secondary | ICD-10-CM | POA: Diagnosis not present

## 2017-12-17 DIAGNOSIS — E119 Type 2 diabetes mellitus without complications: Secondary | ICD-10-CM | POA: Diagnosis not present

## 2017-12-17 DIAGNOSIS — Z955 Presence of coronary angioplasty implant and graft: Secondary | ICD-10-CM

## 2017-12-17 DIAGNOSIS — Z87891 Personal history of nicotine dependence: Secondary | ICD-10-CM

## 2017-12-17 DIAGNOSIS — G4730 Sleep apnea, unspecified: Secondary | ICD-10-CM

## 2017-12-17 DIAGNOSIS — I130 Hypertensive heart and chronic kidney disease with heart failure and stage 1 through stage 4 chronic kidney disease, or unspecified chronic kidney disease: Secondary | ICD-10-CM | POA: Diagnosis present

## 2017-12-17 DIAGNOSIS — I5022 Chronic systolic (congestive) heart failure: Secondary | ICD-10-CM | POA: Diagnosis not present

## 2017-12-17 DIAGNOSIS — Z79899 Other long term (current) drug therapy: Secondary | ICD-10-CM

## 2017-12-17 DIAGNOSIS — M109 Gout, unspecified: Secondary | ICD-10-CM | POA: Diagnosis present

## 2017-12-17 DIAGNOSIS — I252 Old myocardial infarction: Secondary | ICD-10-CM

## 2017-12-17 DIAGNOSIS — I248 Other forms of acute ischemic heart disease: Secondary | ICD-10-CM | POA: Diagnosis present

## 2017-12-17 DIAGNOSIS — I5042 Chronic combined systolic (congestive) and diastolic (congestive) heart failure: Secondary | ICD-10-CM | POA: Diagnosis present

## 2017-12-17 DIAGNOSIS — Z961 Presence of intraocular lens: Secondary | ICD-10-CM | POA: Diagnosis present

## 2017-12-17 DIAGNOSIS — C9001 Multiple myeloma in remission: Secondary | ICD-10-CM | POA: Diagnosis present

## 2017-12-17 DIAGNOSIS — I10 Essential (primary) hypertension: Secondary | ICD-10-CM

## 2017-12-17 DIAGNOSIS — E1122 Type 2 diabetes mellitus with diabetic chronic kidney disease: Secondary | ICD-10-CM | POA: Diagnosis present

## 2017-12-17 DIAGNOSIS — IMO0001 Reserved for inherently not codable concepts without codable children: Secondary | ICD-10-CM

## 2017-12-17 DIAGNOSIS — I4581 Long QT syndrome: Secondary | ICD-10-CM | POA: Diagnosis not present

## 2017-12-17 DIAGNOSIS — E785 Hyperlipidemia, unspecified: Secondary | ICD-10-CM | POA: Diagnosis present

## 2017-12-17 DIAGNOSIS — L89899 Pressure ulcer of other site, unspecified stage: Secondary | ICD-10-CM | POA: Diagnosis present

## 2017-12-17 DIAGNOSIS — K219 Gastro-esophageal reflux disease without esophagitis: Secondary | ICD-10-CM | POA: Diagnosis present

## 2017-12-17 DIAGNOSIS — L8990 Pressure ulcer of unspecified site, unspecified stage: Secondary | ICD-10-CM | POA: Diagnosis present

## 2017-12-17 DIAGNOSIS — Z9842 Cataract extraction status, left eye: Secondary | ICD-10-CM

## 2017-12-17 DIAGNOSIS — I482 Chronic atrial fibrillation: Secondary | ICD-10-CM | POA: Diagnosis present

## 2017-12-17 DIAGNOSIS — A419 Sepsis, unspecified organism: Secondary | ICD-10-CM | POA: Diagnosis present

## 2017-12-17 DIAGNOSIS — T8144XA Sepsis following a procedure, initial encounter: Secondary | ICD-10-CM

## 2017-12-17 DIAGNOSIS — Z9989 Dependence on other enabling machines and devices: Secondary | ICD-10-CM

## 2017-12-17 DIAGNOSIS — Z7982 Long term (current) use of aspirin: Secondary | ICD-10-CM

## 2017-12-17 DIAGNOSIS — R319 Hematuria, unspecified: Secondary | ICD-10-CM

## 2017-12-17 DIAGNOSIS — E118 Type 2 diabetes mellitus with unspecified complications: Secondary | ICD-10-CM

## 2017-12-17 LAB — COMPREHENSIVE METABOLIC PANEL
ALT: 47 U/L — ABNORMAL HIGH (ref 0–44)
AST: 41 U/L (ref 15–41)
Albumin: 3.3 g/dL — ABNORMAL LOW (ref 3.5–5.0)
Alkaline Phosphatase: 115 U/L (ref 38–126)
Anion gap: 7 (ref 5–15)
BUN: 39 mg/dL — ABNORMAL HIGH (ref 6–20)
CO2: 24 mmol/L (ref 22–32)
Calcium: 8.6 mg/dL — ABNORMAL LOW (ref 8.9–10.3)
Chloride: 110 mmol/L (ref 98–111)
Creatinine, Ser: 2.11 mg/dL — ABNORMAL HIGH (ref 0.61–1.24)
GFR calc Af Amer: 38 mL/min — ABNORMAL LOW (ref >60)
GFR calc non Af Amer: 33 mL/min — ABNORMAL LOW (ref >60)
Glucose, Bld: 129 mg/dL — ABNORMAL HIGH (ref 70–99)
Potassium: 4.3 mmol/L (ref 3.5–5.1)
Sodium: 141 mmol/L (ref 135–145)
Total Bilirubin: 0.6 mg/dL (ref 0.3–1.2)
Total Protein: 7.1 g/dL (ref 6.5–8.1)

## 2017-12-17 LAB — PROTIME-INR
INR: 2.68
INR: 2.37
Prothrombin Time: 28.3 seconds — ABNORMAL HIGH (ref 11.4–15.2)
Prothrombin Time: 25.7 seconds — ABNORMAL HIGH (ref 11.4–15.2)

## 2017-12-17 LAB — LACTIC ACID, PLASMA
Lactic Acid, Venous: 1 mmol/L (ref 0.5–1.9)
Lactic Acid, Venous: 1.3 mmol/L (ref 0.5–1.9)

## 2017-12-17 LAB — CBC WITH DIFFERENTIAL/PLATELET
Basophils Absolute: 0 10*3/uL (ref 0.0–0.1)
Basophils Relative: 0 %
Eosinophils Absolute: 0 10*3/uL (ref 0.0–0.7)
Eosinophils Relative: 0 %
HCT: 33.2 % — ABNORMAL LOW (ref 39.0–52.0)
Hemoglobin: 10.5 g/dL — ABNORMAL LOW (ref 13.0–17.0)
Lymphocytes Relative: 23 %
Lymphs Abs: 2.4 10*3/uL (ref 0.7–4.0)
MCH: 30.4 pg (ref 26.0–34.0)
MCHC: 31.6 g/dL (ref 30.0–36.0)
MCV: 96.2 fL (ref 78.0–100.0)
Monocytes Absolute: 0.9 10*3/uL (ref 0.1–1.0)
Monocytes Relative: 8 %
Neutro Abs: 7 10*3/uL (ref 1.7–7.7)
Neutrophils Relative %: 69 %
Platelets: 185 10*3/uL (ref 150–400)
RBC: 3.45 MIL/uL — ABNORMAL LOW (ref 4.22–5.81)
RDW: 17 % — ABNORMAL HIGH (ref 11.5–15.5)
WBC: 10.3 10*3/uL (ref 4.0–10.5)

## 2017-12-17 LAB — URINALYSIS, ROUTINE W REFLEX MICROSCOPIC
Bilirubin Urine: NEGATIVE
Glucose, UA: NEGATIVE mg/dL
Ketones, ur: NEGATIVE mg/dL
Nitrite: NEGATIVE
Protein, ur: 100 mg/dL — AB
RBC / HPF: >50 RBC/hpf — ABNORMAL HIGH (ref 0–5)
Specific Gravity, Urine: 1.009 (ref 1.005–1.030)
WBC, UA: >50 WBC/hpf — ABNORMAL HIGH (ref 0–5)
pH: 5 (ref 5.0–8.0)

## 2017-12-17 LAB — TROPONIN I
Troponin I: 0.1 ng/mL (ref <0.03)
Troponin I: 0.07 ng/mL (ref <0.03)
Troponin I: 0.12 ng/mL (ref <0.03)

## 2017-12-17 LAB — I-STAT CG4 LACTIC ACID, ED: Lactic Acid, Venous: 1.46 mmol/L (ref 0.5–1.9)

## 2017-12-17 LAB — PROCALCITONIN: Procalcitonin: 0.37 ng/mL

## 2017-12-17 LAB — APTT: aPTT: 52 seconds — ABNORMAL HIGH (ref 24–36)

## 2017-12-17 LAB — GLUCOSE, CAPILLARY: Glucose-Capillary: 110 mg/dL — ABNORMAL HIGH (ref 70–99)

## 2017-12-17 MED ORDER — LORATADINE 10 MG PO TABS
10.0000 mg | ORAL_TABLET | Freq: Every morning | ORAL | Status: DC
  Administered 2017-12-18 – 2017-12-21 (×4): 10 mg via ORAL
  Filled 2017-12-17 (×4): qty 1

## 2017-12-17 MED ORDER — SODIUM CHLORIDE 0.9 % IV SOLN
2.0000 g | Freq: Once | INTRAVENOUS | Status: AC
  Administered 2017-12-17: 2 g via INTRAVENOUS
  Filled 2017-12-17: qty 2

## 2017-12-17 MED ORDER — ALLOPURINOL 300 MG PO TABS
300.0000 mg | ORAL_TABLET | Freq: Every day | ORAL | Status: DC
  Administered 2017-12-18 – 2017-12-21 (×4): 300 mg via ORAL
  Filled 2017-12-17 (×4): qty 1

## 2017-12-17 MED ORDER — NITROGLYCERIN 0.4 MG SL SUBL: 0.4000 mg | SUBLINGUAL_TABLET | SUBLINGUAL | Status: DC | PRN

## 2017-12-17 MED ORDER — INSULIN ASPART 100 UNIT/ML ~~LOC~~ SOLN
0.0000 [IU] | Freq: Three times a day (TID) | SUBCUTANEOUS | Status: DC
  Administered 2017-12-18 – 2017-12-19 (×2): 3 [IU] via SUBCUTANEOUS
  Administered 2017-12-19 – 2017-12-20 (×2): 2 [IU] via SUBCUTANEOUS
  Administered 2017-12-20: 8 [IU] via SUBCUTANEOUS
  Administered 2017-12-21: 3 [IU] via SUBCUTANEOUS

## 2017-12-17 MED ORDER — SODIUM CHLORIDE 0.9 % IV SOLN
INTRAVENOUS | Status: AC
  Administered 2017-12-17 – 2017-12-18 (×2): via INTRAVENOUS

## 2017-12-17 MED ORDER — ASPIRIN EC 81 MG PO TBEC
81.0000 mg | DELAYED_RELEASE_TABLET | Freq: Every day | ORAL | Status: DC
  Administered 2017-12-18 – 2017-12-21 (×4): 81 mg via ORAL
  Filled 2017-12-17 (×4): qty 1

## 2017-12-17 MED ORDER — ACYCLOVIR 400 MG PO TABS
400.0000 mg | ORAL_TABLET | Freq: Every morning | ORAL | Status: DC
  Administered 2017-12-18 – 2017-12-21 (×4): 400 mg via ORAL
  Filled 2017-12-17 (×4): qty 1

## 2017-12-17 MED ORDER — OXYCODONE-ACETAMINOPHEN 5-325 MG PO TABS: 1.0000 | ORAL_TABLET | ORAL | Status: DC | PRN

## 2017-12-17 MED ORDER — SODIUM CHLORIDE 0.9 % IV SOLN: 2.0000 g | INTRAVENOUS | Status: DC

## 2017-12-17 MED ORDER — DICYCLOMINE HCL 20 MG PO TABS
20.0000 mg | ORAL_TABLET | Freq: Three times a day (TID) | ORAL | Status: DC
  Administered 2017-12-18 – 2017-12-21 (×11): 20 mg via ORAL
  Filled 2017-12-17 (×12): qty 1

## 2017-12-17 MED ORDER — INSULIN ASPART 100 UNIT/ML ~~LOC~~ SOLN
0.0000 [IU] | Freq: Every day | SUBCUTANEOUS | Status: DC
  Administered 2017-12-18: 3 [IU] via SUBCUTANEOUS

## 2017-12-17 MED ORDER — ONDANSETRON HCL 4 MG PO TABS: 4.0000 mg | ORAL_TABLET | Freq: Four times a day (QID) | ORAL | Status: DC | PRN

## 2017-12-17 MED ORDER — ONDANSETRON HCL 4 MG/2ML IJ SOLN: 4.0000 mg | Freq: Four times a day (QID) | INTRAMUSCULAR | Status: DC | PRN

## 2017-12-17 MED ORDER — SODIUM CHLORIDE 0.9 % IV BOLUS
500.0000 mL | Freq: Once | INTRAVENOUS | Status: AC
  Administered 2017-12-17: 500 mL via INTRAVENOUS

## 2017-12-17 MED ORDER — SODIUM CHLORIDE 0.9 % IV BOLUS (SEPSIS)
1000.0000 mL | Freq: Once | INTRAVENOUS | Status: AC
  Administered 2017-12-17: 1000 mL via INTRAVENOUS

## 2017-12-17 MED ORDER — INSULIN DETEMIR 100 UNIT/ML ~~LOC~~ SOLN
10.0000 [IU] | Freq: Every morning | SUBCUTANEOUS | Status: DC
  Administered 2017-12-18 – 2017-12-21 (×4): 10 [IU] via SUBCUTANEOUS
  Filled 2017-12-17 (×4): qty 0.1

## 2017-12-17 MED ORDER — ACETAMINOPHEN 325 MG PO TABS
650.0000 mg | ORAL_TABLET | Freq: Once | ORAL | Status: AC
  Administered 2017-12-17: 650 mg via ORAL
  Filled 2017-12-17: qty 2

## 2017-12-17 MED ORDER — ELUXADOLINE 100 MG PO TABS
100.0000 mg | ORAL_TABLET | Freq: Two times a day (BID) | ORAL | Status: DC
  Administered 2017-12-18 – 2017-12-21 (×6): 100 mg via ORAL
  Filled 2017-12-17: qty 1

## 2017-12-17 MED ORDER — ATORVASTATIN CALCIUM 40 MG PO TABS
80.0000 mg | ORAL_TABLET | Freq: Every day | ORAL | Status: DC
  Administered 2017-12-17 – 2017-12-20 (×4): 80 mg via ORAL
  Filled 2017-12-17 (×4): qty 2

## 2017-12-17 MED ORDER — RIVAROXABAN 20 MG PO TABS
20.0000 mg | ORAL_TABLET | Freq: Every day | ORAL | Status: DC
  Administered 2017-12-18 – 2017-12-21 (×4): 20 mg via ORAL
  Filled 2017-12-17 (×4): qty 1

## 2017-12-17 MED ORDER — CALCITRIOL 0.25 MCG PO CAPS
0.2500 ug | ORAL_CAPSULE | ORAL | Status: DC
  Administered 2017-12-21: 0.25 ug via ORAL
  Filled 2017-12-17: qty 1

## 2017-12-17 MED ORDER — PANTOPRAZOLE SODIUM 40 MG PO TBEC
40.0000 mg | DELAYED_RELEASE_TABLET | Freq: Every day | ORAL | Status: DC
  Administered 2017-12-18 – 2017-12-21 (×4): 40 mg via ORAL
  Filled 2017-12-17 (×4): qty 1

## 2017-12-17 MED ORDER — ACETAMINOPHEN 500 MG PO TABS
500.0000 mg | ORAL_TABLET | Freq: Four times a day (QID) | ORAL | Status: DC | PRN
  Administered 2017-12-19: 500 mg via ORAL
  Filled 2017-12-17: qty 1

## 2017-12-17 MED ORDER — ADULT MULTIVITAMIN W/MINERALS CH
1.0000 | ORAL_TABLET | Freq: Every day | ORAL | Status: DC
  Administered 2017-12-18 – 2017-12-21 (×4): 1 via ORAL
  Filled 2017-12-17 (×4): qty 1

## 2017-12-17 MED ORDER — GABAPENTIN 300 MG PO CAPS
300.0000 mg | ORAL_CAPSULE | Freq: Two times a day (BID) | ORAL | Status: DC
  Administered 2017-12-17 – 2017-12-21 (×8): 300 mg via ORAL
  Filled 2017-12-17 (×8): qty 1

## 2017-12-17 NOTE — ED Provider Notes (Addendum)
[place] DEPT Provider Note   CSN: [id number] Arrival date & time: 12/17/17  1212     History   Chief Complaint Chief Complaint  Patient presents with  . Fever    HPI [name] is a 57 y.o. male.  HPI  Patient is a 57 year old male with a history of insulin-dependent diabetes, CKD stage III, 60s gangrene, multiple myeloma (in remission), heart failure reduced ejection fraction, and ureteroscopy with removal of stent yesterday per Dr. Alyson Ingles presenting for fever.  Patient reports that the fever began slowly yesterday, and spiked up to 101 at home today.  Patient reports that he has been generally weak.  Patient denies any dysuria, urgency, or frequency.  Patient reports he has had hematuria that began clearing up this afternoon after the procedure.  Patient denies any nausea, vomiting, or diarrhea.  Patient denies any cough, shortness of breath.  Patient reports some posterior neck discomfort beginning last night, but no neck stiffness or rigidity.  Patient does report that the neck pain extending around his left shoulder and across his left chest, but he does not have this at present.  Patient does report some right-sided flank pain since his procedure yesterday.  Family administered Tylenol at 9 AM this morning for fever.  Past Medical History:  Diagnosis Date  . Abscess 04/2017   Scrotal  . Anemia   . Arteriosclerotic cardiovascular disease (ASCVD)    MI-2000s; stent to the proximal LAD and diagonal in 2001; stress nuclear in 2008-impaired exercise capacity, left ventricular dilatation, moderately to severely depressed EF, apical, inferior and anteroseptal scar  . Arthritis   . Atrial flutter ([place])   . 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 Cellulitis of leg    both legs  . Chronic diarrhea   . Chronic kidney disease, stage 3, mod decreased GFR (HCC)    Creatinine of 1.84 in 06/2011 and 1.5 in 07/2011  . Diabetes mellitus    Insulin  .  Dysrhythmia    AFlutter  . GERD (gastroesophageal reflux disease)   . Gout   . Hyperlipidemia   . Hypertension   . Injection site reaction   . Multiple myeloma 07/01/2011  . Myocardial infarction ([place]) 2000  . Obesity   . Pedal edema    Venous insufficiency  . Sleep apnea    uses cpap  . Ulcer     Patient Active Problem List   Diagnosis Date Noted  . Dark urine 12/02/2017  . Multiple myeloma in remission ([place]) 11/10/2017  . CKD (chronic kidney disease) stage 3, GFR 30-59 ml/min (HCC) 11/10/2017  . Chronic systolic CHF (congestive heart failure) ([place]) 11/10/2017  . MRSA bacteremia 10/03/2017  . Pressure injury of skin 08/04/2017  . Fournier's gangrene of scrotum 04/14/2017  . CAP (community acquired pneumonia) 06/10/2016  . Hypogammaglobulinemia ([place]) 03/09/2016  . Diaphragmatic hernia without obstruction 02/26/2016  . Chronic systolic heart failure ([place]) 12/19/2013  . Anemia, normocytic normochromic 10/09/2012  . Chronic pancreatitis ([place]) 10/07/2012  . Atrial flutter ([place]) 09/17/2012  . DDD (degenerative disc disease), cervical 03/18/2012  . Arteriosclerotic cardiovascular disease (ASCVD)   . Hypertension   . Hyperlipidemia   . Multiple myeloma ([place]) 07/01/2011  . Morbid obesity ([place]) 04/29/2010  . Insulin dependent diabetes mellitus ([place]) 11/15/2008  . Sleep apnea 11/15/2008    Past Surgical History:  Procedure Laterality Date  . ABSCESS DRAINAGE     Scrotal  . BIOPSY  01/02/2012   Procedure: BIOPSY;  Surgeon: Rogene Houston,  MD;  [facility];  Service: Endoscopy;  Laterality: N/A;  . BONE MARROW BIOPSY  05/13/11  . CARDIAC CATHETERIZATION     cardiac stent  . CARDIOVERSION N/A 10/13/2012   Procedure: CARDIOVERSION;  Surgeon: Yehuda Savannah, MD;  Location: [facility];  Service: Cardiovascular;  Laterality: N/A;  . CATARACT EXTRACTION W/PHACO Left 02/13/2014   Procedure: CATARACT EXTRACTION PHACO AND INTRAOCULAR LENS PLACEMENT ([place]);  Surgeon: Tonny Branch, MD;  Location: [facility];  Service: Ophthalmology;  Laterality: Left;  CDE:  7.67  . CATARACT EXTRACTION W/PHACO Right 03/02/2014   Procedure: CATARACT EXTRACTION PHACO AND INTRAOCULAR LENS PLACEMENT RIGHT EYE CDE=16.81;  Surgeon: Tonny Branch, MD;  Location: [facility];  Service: Ophthalmology;  Laterality: Right;  . COLONOSCOPY  11/28/2011   Procedure: COLONOSCOPY;  Surgeon: Rogene Houston, MD;  [facility];  Service: Endoscopy;  Laterality: N/A;  930  . CYSTOSCOPY W/ RETROGRADES Right 12/16/2017   Procedure: CYSTOSCOPY WITH RIGHT RETROGRADE PYELOGRAM AND RIGHT URETERAL STENT REMOVAL;  Surgeon: Cleon Gustin, MD;  Location: [facility];  Service: Urology;  Laterality: Right;  . CYSTOSCOPY W/ URETERAL STENT PLACEMENT Right 07/31/2017   Procedure: CYSTOSCOPY WITH RETROGRADE PYELOGRAM/URETERAL STENT PLACEMENT;  Surgeon: Cleon Gustin, MD;  Location: [facility];  Service: Urology;  Laterality: Right;  . ESOPHAGOGASTRODUODENOSCOPY  01/02/2012   Procedure: ESOPHAGOGASTRODUODENOSCOPY (EGD);  Surgeon: Rogene Houston, MD;  [facility];  Service: Endoscopy;  Laterality: N/A;  100  . ESOPHAGOGASTRODUODENOSCOPY N/A 09/20/2012   Procedure: ESOPHAGOGASTRODUODENOSCOPY (EGD);  Surgeon: Rogene Houston, MD;  [facility];  Service: Endoscopy;  Laterality: N/A;  . EUS N/A 10/07/2012   Procedure: UPPER ENDOSCOPIC ULTRASOUND (EUS) LINEAR;  Surgeon: Milus Banister, MD;  [facility];  Service: Endoscopy;  Laterality: N/A;  . INCISION AND DRAINAGE ABSCESS N/A 04/14/2017   Procedure: INCISION AND DRAINAGE ABSCESS;  Surgeon: Ceasar Mons, MD;  [facility];  Service: Urology;  Laterality: N/A;  . INCISION AND DRAINAGE ABSCESS ANAL    . IRRIGATION AND DEBRIDEMENT ABSCESS N/A 04/15/2017   Procedure: DEBRIDEMENT SCROTAL WOUND AND DRESSING CHANGE;  Surgeon: Ceasar Mons, MD;  [facility];  Service: Urology;  Laterality: N/A;  . IRRIGATION AND  DEBRIDEMENT ABSCESS N/A 04/17/2017   Procedure: IRRIGATION AND DEBRIDEMENT ABSCESS;  Surgeon: Ceasar Mons, MD;  [facility];  Service: Urology;  Laterality: N/A;  RM 3  . LAPAROSCOPIC GASTRIC BANDING  2006   has been removed  . OSTECTOMY Right 04/08/2017   Procedure: OSTECTOMY RIGHT GREAT TOE;  Surgeon: Caprice Beaver, DPM;  Location: [facility];  Service: Podiatry;  Laterality: Right;  . PORT-A-CATH REMOVAL Left 12/07/2012   Procedure: REMOVAL PORT-A-CATH;  Surgeon: Scherry Ran, MD;  Location: [facility];  Service: General;  Laterality: Left;  . PORT-A-CATH REMOVAL N/A 10/02/2017   Procedure: MINOR REMOVAL PORT-A-CATH AT BEDSIDE;  Surgeon: Donnie Mesa, MD;  Location: [facility];  Service: General;  Laterality: N/A;  . PORTACATH PLACEMENT  07/07/2011   Procedure: INSERTION PORT-A-CATH;  Surgeon: Scherry Ran, MD;  Location: [facility];  Service: General;  Laterality: N/A;  . PORTACATH PLACEMENT N/A 12/07/2012   Procedure: INSERTION PORT-A-CATH;  Surgeon: Scherry Ran, MD;  Location: [facility];  Service: General;  Laterality: N/A;  Attempted portacath placement on left and right side  . URETEROSCOPY Right 12/16/2017   Procedure: DIAGNOSTIC RIGHT URETEROSCOPY;  Surgeon: Cleon Gustin, MD;  Location: [facility];  Service: Urology;  Laterality: Right;  . WOUND DEBRIDEMENT Right 04/08/2017   Procedure: EXCISION ULCERATION RIGHT GREAT TOE;  Surgeon: Caprice Beaver, DPM;  Location: [facility];  Service: Podiatry;  Laterality: Right;  . WRIST SURGERY     Left; removal of bone fragment        Home Medications    Prior to Admission medications   Medication Sig Start Date End Date Taking? Authorizing Provider  acetaminophen (TYLENOL) 500 MG tablet Take 500-1,000 mg by mouth every 6 (six) hours as needed for mild pain or moderate pain.   Yes [provider]  acyclovir (ZOVIRAX) 400 MG tablet Take 1 tablet (400 mg total) by mouth every morning. 02/18/17  Yes Twana First, MD    allopurinol (ZYLOPRIM) 300 MG tablet TAKE ONE TABLET BY MOUTH ONCE DAILY. 09/15/17  Yes Holley Bouche, NP  Artificial Tear Ointment (DRY EYES OP) Place 1 drop into both eyes as needed (dry eyes).   Yes [provider]  aspirin EC 81 MG tablet Take 81 mg by mouth daily.   Yes [provider]  atorvastatin (LIPITOR) 80 MG tablet TAKE ONE TABLET BY MOUTH AT BEDTIME. 12/18/16  Yes Herminio Commons, MD  calcitRIOL (ROCALTROL) 0.25 MCG capsule Take 0.25 mcg by mouth every Monday.    Yes [provider]  Calcium Carbonate (CALCIUM 600 PO) Take 1 tablet by mouth 2 (two) times daily.   Yes [provider]  clotrimazole (LOTRIMIN) 1 % cream Apply 1 application topically 2 (two) times daily.   Yes [provider]  dexlansoprazole (DEXILANT) 60 MG capsule Take 60 mg by mouth every morning.   Yes [provider]  dicyclomine (BENTYL) 20 MG tablet TAKE 1 TABLET BY MOUTH BEFORE MEALS 3 TIMES DAILY. 09/11/17  Yes Rehman, Mechele Dawley, MD  Eluxadoline (VIBERZI) 100 MG TABS Take 100 mg 2 (two) times daily with a meal by mouth.    Yes [provider]  gabapentin (NEURONTIN) 300 MG capsule Take 1 capsule (300 mg total) by mouth 2 (two) times daily. 04/22/17  Yes Short, Noah Delaine, MD  insulin detemir (LEVEMIR) 100 UNIT/ML injection Inject 0.1 mLs (10 Units total) into the skin every morning. 04/22/17  Yes Short, Noah Delaine, MD  insulin lispro (HUMALOG) 100 UNIT/ML injection Inject 0-0.1 mLs (0-10 Units total) into the skin 2 (two) times daily with a meal. Sliding Scale per patient  CBG 150-200: 1 units CBG 201-275:  3 units CBG 275-350: 5 units 351-425: 7 units 425-500: 9 units 500+: 10 units 04/22/17  Yes Short, Mackenzie, MD  loratadine (CLARITIN) 10 MG tablet Take 10 mg by mouth every morning.    Yes [provider]  Magnesium Cl-Calcium Carbonate (SLOW-MAG PO) Take 1 tablet by mouth every morning.   Yes [provider]  Multiple  Vitamins-Minerals (MULTIVITAMINS THER. W/MINERALS) TABS Take 1 tablet by mouth daily.    Yes [provider]  nitroGLYCERIN (NITROSTAT) 0.4 MG SL tablet DISSOLVE 1 TABLET UNDER TONGUE EVERY 5 MINUTES UP TO 15 MIN FOR CHESTPAIN. IF NO RELIEF CALL 911. 05/11/17  Yes Holley Bouche, NP  oxyCODONE-acetaminophen (PERCOCET) 5-325 MG tablet Take 1 tablet by mouth every 4 (four) hours as needed for severe pain. 12/16/17 12/16/18 Yes McKenzie, Candee Furbish, MD  potassium chloride SA (K-DUR,KLOR-CON) 20 MEQ tablet Take 1 tablet (20 mEq total) by mouth daily. Patient taking differently: Take 20 mEq by mouth 2 (two) times daily.  08/05/17  Yes Lavina Hamman, MD  torsemide (DEMADEX) 20 MG tablet  Take 1 tablet (20 mg total) by mouth 2 (two) times daily. Take extra 1 tab for weight gain of 3 lbs in 1 day or 5 lbs in 2 days 10/08/17  Yes Rai, Ripudeep K, MD  Vitamin D, Ergocalciferol, (DRISDOL) 50000 units CAPS capsule TAKE 1 CAPSULE BY MOUTH EVERY THIRTY DAYS. 05/11/17  Yes Holley Bouche, NP  XARELTO 20 MG TABS tablet TAKE 1 TABLET BY MOUTH DAILY. 09/21/17  Yes Herminio Commons, MD    Family History Family History  Problem Relation Age of Onset  . Heart disease Mother   . Cancer Mother   . Diabetes Father   . Arthritis Unknown   . Anesthesia problems Neg Hx   . Hypotension Neg Hx   . Malignant hyperthermia Neg Hx   . Pseudochol deficiency Neg Hx     Social History Social History   Tobacco Use  . Smoking status: Former Smoker    Packs/day: 0.25    Years: 1.00    Pack years: 0.25    Types: Cigarettes, Cigars    Last attempt to quit: 05/17/2001    Years since quitting: 16.5  . Smokeless tobacco: Never Used  Substance Use Topics  . Alcohol use: No    Alcohol/week: 0.0 oz  . Drug use: No     Allergies   Beef-derived products and Pork-derived products   Review of Systems Review of Systems  Constitutional: Positive for chills and fever.  HENT: Negative for congestion and  rhinorrhea.   Eyes: Negative for visual disturbance.  Respiratory: Negative for shortness of breath.   Cardiovascular: Negative for chest pain.  Gastrointestinal: Negative for nausea and vomiting.  Genitourinary: Positive for flank pain. Negative for penile swelling, scrotal swelling and testicular pain.  Musculoskeletal: Positive for arthralgias and neck pain.  Skin: Negative for rash and wound.  Neurological: Positive for weakness. Negative for dizziness, syncope, speech difficulty, light-headedness and numbness.       +Generalized weakness     Physical Exam Updated Vital Signs BP (!) 153/90   Pulse 86   Temp 99.8 F (37.7 C) (Oral)   Resp (!) 24   Ht _0  (1.88 m)   Wt (!) 156.9 kg (346 lb)   SpO2 100%   BMI 44.42 kg/m   Physical Exam  Constitutional: He appears well-developed and well-nourished. No distress.  Obese male, appearing older than stated age.  HENT:  Head: Normocephalic and atraumatic.  Mouth/Throat: Oropharynx is clear and moist.  Eyes: Pupils are equal, round, and reactive to light. Conjunctivae and EOM are normal.  Neck: Normal range of motion. Neck supple.  No nuchal rigidity.  No meningismus.  Negative Brudzinski sign.  Cardiovascular: Normal rate, regular rhythm, S1 normal and S2 normal.  Heart sounds distant.  Pulmonary/Chest: Effort normal and breath sounds normal.  Diminished breath sounds in bilateral lower lung fields.  Abdominal: Soft. He exhibits no distension. There is no tenderness. There is no guarding.  Genitourinary:  Genitourinary Comments: Scrotal examination performed with RN chaperone present.  Scrotum is nonerythematous, nonswollen, with no tenderness of bilateral testes. Perineum without breakdown.  Musculoskeletal: Normal range of motion. He exhibits no edema or deformity.  Lymphadenopathy:    He has no cervical adenopathy.  Neurological: He is alert.  Cranial nerves grossly intact. Patient moves extremities symmetrically and  with good coordination.  Skin: Skin is warm and dry. No rash noted. No erythema.  Psychiatric: He has a normal mood and affect. His behavior is normal. Judgment and  thought content normal.  Nursing note and vitals reviewed.    ED Treatments / Results  Labs (all labs ordered are listed, but only abnormal results are displayed) Labs Reviewed  CBC WITH DIFFERENTIAL/PLATELET - Abnormal; Notable for the following components:      Result Value   RBC 3.45 (*)    Hemoglobin 10.5 (*)    HCT 33.2 (*)    RDW 17.0 (*)    All other components within normal limits  PROTIME-INR - Abnormal; Notable for the following components:   Prothrombin Time 28.3 (*)    All other components within normal limits  CULTURE, BLOOD (ROUTINE X 2)  CULTURE, BLOOD (ROUTINE X 2)  URINE CULTURE  COMPREHENSIVE METABOLIC PANEL  URINALYSIS, ROUTINE W REFLEX MICROSCOPIC  I-STAT CG4 LACTIC ACID, ED    EKG None  Radiology Dg Chest 2 View  Result Date: 12/17/2017 CLINICAL DATA:  Sepsis.  Fever. EXAM: CHEST - 2 VIEW COMPARISON:  11/09/2017 FINDINGS: Cardiomegaly. Left diaphragmatic hernia containing large bowel again noted, similar to prior study. Adjacent left base opacity likely reflects atelectasis. Mild vascular congestion. No overt edema. IMPRESSION: Cardiomegaly.  Vascular congestion. Left diaphragmatic hernia containing large bowel. Adjacent left base atelectasis. Electronically Signed   By: Rolm Baptise M.D.   On: 12/17/2017 14:11   Dg C-arm 1-60 Min-no Report  Result Date: 12/16/2017 Fluoroscopy was utilized by the requesting physician.  No radiographic interpretation.    Procedures Procedures (including critical care time)  CRITICAL CARE Performed by: Albesa Seen   Total critical care time: 35 minutes  Critical care time was exclusive of separately billable procedures and treating other patients.  Critical care was necessary to treat or prevent imminent or life-threatening  deterioration.  Critical care was time spent personally by me on the following activities: development of treatment plan with patient and/or surrogate as well as nursing, discussions with consultants, evaluation of patient's response to treatment, examination of patient, obtaining history from patient or surrogate, ordering and performing treatments and interventions, ordering and review of laboratory studies, ordering and review of radiographic studies, pulse oximetry and re-evaluation of patient's condition.   Medications Ordered in ED Medications  sodium chloride 0.9 % bolus 500 mL (500 mLs Intravenous New Bag/Given 12/17/17 1340)     Initial Impression / Assessment and Plan / ED Course  I have reviewed the triage vital signs and the nursing notes.  Pertinent labs & imaging results that were available during my care of the patient were reviewed by me and considered in my medical decision making (see chart for details).  Clinical Course as of Dec 17 1736  Thu Dec 17, 2017  1327 Patient seen and evaluated.  Nontoxic-appearing.  Patient not meeting SIRS criteria at this time.  Will await urinalysis before antibiotic selection.  Blood pressure stable with systolic of 332 at this time.  Given ejection fraction, will softly hydrate patient with 500 mL of normal saline.   [AM]  1440 Your blood pressure noted.  Patient may code sepsis at this time.  Will initiate cefepime based on urinalysis result.  BP(!): 98/50 [AM]  1450 Elevated compared to baseline. Suggests AKI.  Creatinine(!): 2.11 [AM]  1451 Patient anticoagulated on Xarelto.  Prothrombin Time(!): 28.3 [AM]  1512 Spoke with Dr. Alfredia Ferguson of [facility] who will come to evaluate pt and admit to stepdown.  Appreciate the involvement of hospital medicine in the care of this patient.   [AM]  1559 Noted from lab.  Likely demand ischemia in  the setting of sepsis.  Troponin I(!!): 0.10 [AM]    Clinical Course User Index [AM] Langston Masker B, PA-C    Differential diagnosis of adult fever in diabetic patient includes urinary tract infection/pyelonephritis given recent cystoscopy, pneumonia, cellulitis, meningitis, intra-abdominal infection.  Patient is critically ill and requiring a higher level of care. Sepsis suspected. Code sepsis called. Patient meeting SIRS criteria based on tachypnea, fever, and hypotension. See vitals below. Suspected source of infection urinary based on recent urinary procedure. Lactic acid normal (first). Signs of end organ dysfunction include AKI, hypotension.  Chest x-ray clear infiltrate is significant pulmonary edema.  EKG demonstrating atrial fibrillation, which is chronic for patient.  Vitals:   12/17/17 1400 12/17/17 1412 12/17/17 1430 12/17/17 1436  BP: (!) 110/59  (!) 98/50 (!) 98/50  Pulse: 78  71 66  Resp: (!) 26  (!) 34 (!) 24  Temp:  (!) 102.9 F (39.4 C)    TempSrc:  Rectal    SpO2: 99%  96% 99%  Weight:      Height:        Lab Results  Component Value Date   CREATININE 2.11 (H) 12/17/2017   CREATININE 1.80 (H) 12/11/2017   CREATININE 1.74 (H) 12/11/2017    Broad spectrum antibiotics initiated based on suspected source of infection. IBW  fluid bolus administered due to new hypotension in ED.  (Renal, cardiac, AMS, elevated bilirubin)  Sepsis - Repeat Assessment  Performed at:    1729  Vitals at time of Code Sepsis Blood pressure (!) 98/50, pulse 66, temperature (!) 102.9 F (39.4 C), temperature source Rectal, resp. rate (!) 24, height _0  (1.88 m), weight (!) 156.9 kg (346 lb), SpO2 99 %.  Vitals at Recheck:  Vitals:   12/17/17 1436 12/17/17 1706  BP: (!) 98/50 140/73  Pulse: 66 69  Resp: (!) 24 17  Temp:    SpO2: 99% 100%    Heart:     Irregular rate and rhythm and nontachycardic  Lungs:    CTA  Capillary Refill:   <2 sec  Peripheral Pulse:   Radial pulse palpable  Skin:     Normal Color   Spoke with hospitalist Dr. Alfredia Ferguson who has decided to admit  patient to a high level of care.    Final Clinical Impressions(s) / ED Diagnoses   Final diagnoses:  Sepsis following procedure, initial encounter [facility])  Urinary tract infection with hematuria, site unspecified    ED Discharge Orders    None        [name] 12/18/17 Octaviano Glow, MD 12/18/17 [phone]

## 2017-12-17 NOTE — Progress Notes (Signed)
Patient unable to stand up for orthostatic vital signs

## 2017-12-17 NOTE — ED Notes (Signed)
ED TO INPATIENT HANDOFF REPORT  Name/Age/Gender Roda Shutters 57 y.o. male  Code Status Code Status History    Date Active Date Inactive Code Status Order ID Comments User Context   11/09/2017 1915 11/12/2017 1948 Full Code [id number]  Phillips Grout, MD ED   10/01/2017 0300 10/08/2017 2126 Full Code [id number]  Reyne Dumas, MD Inpatient   08/13/2017 1652 08/14/2017 2035 Full Code [id number]  Phillips Grout, MD ED   07/31/2017 0046 08/04/2017 2204 Full Code [id number]  Heath Lark D, DO ED   04/13/2017 1412 04/22/2017 1952 Full Code [id number]  Kinnie Feil, MD ED   06/10/2016 1306 06/12/2016 1649 Full Code [id number]  Rosita Fire, MD ED   09/28/2014 1127 09/29/2014 0337 Full Code [id number]  Sandi Mariscal, MD HOV   09/19/2012 1551 09/20/2012 2027 Full Code [id number]  Doree Albee, MD ED   09/07/2012 1950 09/08/2012 2045 Full Code [id number]  Samuella Cota, MD Inpatient   09/23/2011 2351 09/25/2011 2207 Full Code [id number]  Donnalee Curry, RN Inpatient      Home/SNF/Other Home  Chief Complaint post op fever   Level of Care/Admitting Diagnosis ED Disposition    ED Disposition Condition [place]: [facility] [[id number]]  Level of Care: Telemetry [5]  Admit to tele based on following criteria: Monitor QTC interval  Diagnosis: Sepsis secondary to UTI [facility]) [208022]  Admitting Physician: [facility] [place] [[id number]]  Attending Physician: Raiford Noble LATIF [[id number]]  Estimated length of stay: past midnight tomorrow  Certification:: I certify this patient will need inpatient services for at least 2 midnights  PT Class (Do Not Modify): Inpatient [101]  PT Acc Code (Do Not Modify): Private [1]       Medical History Past Medical History:  Diagnosis Date  . Abscess 04/2017   Scrotal  . Anemia   . Arteriosclerotic cardiovascular disease (ASCVD)    MI-2000s; stent to the proximal LAD and diagonal in 2001; stress nuclear in  2008-impaired exercise capacity, left ventricular dilatation, moderately to severely depressed EF, apical, inferior and anteroseptal scar  . Arthritis   . Atrial flutter ([place])   . 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 Cellulitis of leg    both legs  . Chronic diarrhea   . Chronic kidney disease, stage 3, mod decreased GFR (HCC)    Creatinine of 1.84 in 06/2011 and 1.5 in 07/2011  . Diabetes mellitus    Insulin  . Dysrhythmia    AFlutter  . GERD (gastroesophageal reflux disease)   . Gout   . Hyperlipidemia   . Hypertension   . Injection site reaction   . Multiple myeloma 07/01/2011  . Myocardial infarction ([place]) 2000  . Obesity   . Pedal edema    Venous insufficiency  . Sleep apnea    uses cpap  . Ulcer     Allergies Allergies  Allergen Reactions  . Beef-Derived Products Diarrhea  . Pork-Derived Products Diarrhea    IV Location/Drains/Wounds Patient Lines/Drains/Airways Status   Active Line/Drains/Airways    Name:   Placement date:   Placement time:   Site:   Days:   Peripheral IV 12/17/17 Left Antecubital   12/17/17    1323    Antecubital   less than 1   Incision (Closed) 10/02/17 Chest Right   10/02/17    1416     76   Incision (Closed) 12/16/17 Penis   12/16/17    1257  1   Pressure Injury 10/04/17 Stage II -  Partial thickness loss of dermis presenting as a shallow open ulcer with a red, pink wound bed without slough.   10/04/17    -     74   Wound / Incision (Open or Dehisced) 07/31/17 Incision - Open Back Left;Lower Patient states from cyst opening up.   07/31/17    0459    Back   139          Labs/Imaging Results for orders placed or performed during the hospital encounter of 12/17/17 (from the past 48 hour(s))  Comprehensive metabolic panel     Status: Abnormal   Collection Time: 12/17/17  1:23 PM  Result Value Ref Range   Sodium 141 135 - 145 mmol/L   Potassium 4.3 3.5 - 5.1 mmol/L   Chloride 110 98 - 111 mmol/L    Comment: Please note change in  reference range.   CO2 24 22 - 32 mmol/L   Glucose, Bld 129 (H) 70 - 99 mg/dL    Comment: Please note change in reference range.   BUN 39 (H) 6 - 20 mg/dL    Comment: Please note change in reference range.   Creatinine, Ser 2.11 (H) 0.61 - 1.24 mg/dL   Calcium 8.6 (L) 8.9 - 10.3 mg/dL   Total Protein 7.1 6.5 - 8.1 g/dL   Albumin 3.3 (L) 3.5 - 5.0 g/dL   AST 41 15 - 41 U/L   ALT 47 (H) 0 - 44 U/L    Comment: Please note change in reference range.   Alkaline Phosphatase 115 38 - 126 U/L   Total Bilirubin 0.6 0.3 - 1.2 mg/dL   GFR calc non Af Amer 33 (L) >60 mL/min   GFR calc Af Amer 38 (L) >60 mL/min    Comment: (NOTE) The eGFR has been calculated using the CKD EPI equation. This calculation has not been validated in all clinical situations. eGFR's persistently <60 mL/min signify possible Chronic Kidney Disease.    Anion gap 7 5 - 15    Comment: Performed at [facility], [place] [address] [place], [place] [address]  CBC with Differential     Status: Abnormal   Collection Time: 12/17/17  1:23 PM  Result Value Ref Range   WBC 10.3 4.0 - 10.5 K/uL   RBC 3.45 (L) 4.22 - 5.81 MIL/uL   Hemoglobin 10.5 (L) 13.0 - 17.0 g/dL   HCT 33.2 (L) 39.0 - 52.0 %   MCV 96.2 78.0 - 100.0 fL   MCH 30.4 26.0 - 34.0 pg   MCHC 31.6 30.0 - 36.0 g/dL   RDW 17.0 (H) 11.5 - 15.5 %   Platelets 185 150 - 400 K/uL   Neutrophils Relative % 69 %   Neutro Abs 7.0 1.7 - 7.7 K/uL   Lymphocytes Relative 23 %   Lymphs Abs 2.4 0.7 - 4.0 K/uL   Monocytes Relative 8 %   Monocytes Absolute 0.9 0.1 - 1.0 K/uL   Eosinophils Relative 0 %   Eosinophils Absolute 0.0 0.0 - 0.7 K/uL   Basophils Relative 0 %   Basophils Absolute 0.0 0.0 - 0.1 K/uL    Comment: Performed at [facility], [place] [address] [place], [place] [address]  Protime-INR     Status: Abnormal   Collection Time: 12/17/17  1:23 PM  Result Value Ref Range   Prothrombin Time 28.3 (H) 11.4 - 15.2 seconds   INR 2.68  Comment: Performed at [facility], [place] [address], [place], [place] [address]  Urinalysis, Routine w reflex microscopic     Status: Abnormal   Collection Time: 12/17/17  1:23 PM  Result Value Ref Range   Color, Urine YELLOW YELLOW   APPearance CLOUDY (A) CLEAR   Specific Gravity, Urine 1.009 1.005 - 1.030   pH 5.0 5.0 - 8.0   Glucose, UA NEGATIVE NEGATIVE mg/dL   Hgb urine dipstick LARGE (A) NEGATIVE   Bilirubin Urine NEGATIVE NEGATIVE   Ketones, ur NEGATIVE NEGATIVE mg/dL   Protein, ur 100 (A) NEGATIVE mg/dL   Nitrite NEGATIVE NEGATIVE   Leukocytes, UA LARGE (A) NEGATIVE   RBC / HPF >50 (H) 0 - 5 RBC/hpf   WBC, UA >50 (H) 0 - 5 WBC/hpf   Bacteria, UA RARE (A) NONE SEEN   Squamous Epithelial / LPF 0-5 0 - 5   WBC Clumps PRESENT     Comment: Performed at [facility], [place] [address] [place], [place] [address]  Troponin I     Status: Abnormal   Collection Time: 12/17/17  1:23 PM  Result Value Ref Range   Troponin I 0.10 (HH) <0.03 ng/mL    Comment: CRITICAL RESULT CALLED TO, READ BACK BY AND VERIFIED WITH: J.TALKINGTON AT 1657 ON 12/17/17 BY N.THOMPSON Performed at [facility], [place] [address] [place], [place] [address]   I-Stat CG4 Lactic Acid, ED     Status: None   Collection Time: 12/17/17  1:38 PM  Result Value Ref Range   Lactic Acid, Venous 1.46 0.5 - 1.9 mmol/L   *Note: Due to a large number of results and/or encounters for the requested time period, some results have not been displayed. A complete set of results can be found in Results Review.   Dg Chest 2 View  Result Date: 12/17/2017 CLINICAL DATA:  Sepsis.  Fever. EXAM: CHEST - 2 VIEW COMPARISON:  11/09/2017 FINDINGS: Cardiomegaly. Left diaphragmatic hernia containing large bowel again noted, similar to prior study. Adjacent left base opacity likely reflects atelectasis. Mild vascular congestion. No overt edema. IMPRESSION: Cardiomegaly.  Vascular  congestion. Left diaphragmatic hernia containing large bowel. Adjacent left base atelectasis. Electronically Signed   By: Rolm Baptise M.D.   On: 12/17/2017 14:11   US Renal  Result Date: 12/17/2017 CLINICAL DATA:  Acute kidney injury EXAM: RENAL / URINARY TRACT ULTRASOUND COMPLETE COMPARISON:  CT 11/10/2016 FINDINGS: Right Kidney: Length: 14.2 cm. Echogenicity within normal limits. No mass or hydronephrosis visualized. Left Kidney: Length: 13.9 cm. Echogenicity within normal limits. No mass or hydronephrosis visualized. Bladder: Appears normal for degree of bladder distention. IMPRESSION: Unremarkable renal ultrasound.  No hydronephrosis. Electronically Signed   By: Rolm Baptise M.D.   On: 12/17/2017 17:47   Dg C-arm 1-60 Min-no Report  Result Date: 12/16/2017 Fluoroscopy was utilized by the requesting physician.  No radiographic interpretation.    Pending Labs Unresulted Labs (From admission, onward)   Start     Ordered   12/18/17 0500  CBC with Differential/Platelet  Tomorrow morning,   R     12/17/17 1711   12/18/17 0500  Comprehensive metabolic panel  Tomorrow morning,   R     12/17/17 1711   12/18/17 0500  Magnesium  Tomorrow morning,   R     12/17/17 1711   12/18/17 0500  Phosphorus  Tomorrow morning,   R     12/17/17 1711   12/17/17 1615  Troponin I (q 6hr  x 3)  Now then every 6 hours,   R     12/17/17 1614   12/17/17 1312  Urine culture  STAT,   STAT     12/17/17 1311   12/17/17 1310  Culture, blood (Routine x 2)  BLOOD CULTURE X 2,   STAT     12/17/17 1310   Signed and Held  Culture, blood (x 2)  BLOOD CULTURE X 2,   STAT    Comments:  INITIATE ANTIBIOTICS WITHIN 1 HOUR AFTER BLOOD CULTURES DRAWN.  If unable to obtain blood cultures, call MD immediately regarding antibiotic instructions.    Signed and Held   Signed and Held  Lactic acid, plasma  STAT Now then every 3 hours,   STAT     Signed and Held   Signed and Held  Procalcitonin  STAT,   R     Signed and Held    Signed and Held  Protime-INR  STAT,   R     Signed and Held   Signed and Held  APTT  STAT,   R     Signed and Held   Signed and Held  Urine culture  Once,   R     Signed and Held   Signed and Held  Comprehensive metabolic panel  Tomorrow morning,   R     Signed and Held      Vitals/Pain Today's Vitals   12/17/17 1730 12/17/17 1800 12/17/17 1830 12/17/17 1832  BP: 125/90 (!) 124/97 117/66 (!) 124/97  Pulse: 88 (!) 139 61 68  Resp: 14 (!) 21 (!) 23 20  Temp:      TempSrc:      SpO2: 100% 100% 100% 100%  Weight:      Height:      PainSc:        Isolation Precautions No active isolations  Medications Medications  sodium chloride 0.9 % bolus 1,000 mL (1,000 mLs Intravenous New Bag/Given 12/17/17 1829)    And  sodium chloride 0.9 % bolus 1,000 mL (0 mLs Intravenous Stopped 12/17/17 1619)  sodium chloride 0.9 % bolus 500 mL (0 mLs Intravenous Stopped 12/17/17 1449)  acetaminophen (TYLENOL) tablet 650 mg (650 mg Oral Given 12/17/17 1453)  ceFEPIme (MAXIPIME) 2 g in sodium chloride 0.9 % 100 mL IVPB (0 g Intravenous Stopped 12/17/17 1619)    Mobility walks with device

## 2017-12-17 NOTE — Progress Notes (Signed)
12/17/2017 10:33am. Call to [facility] at [phone], spoke with Jenny Reichmann, "patient did call office and a note in his chart at our office indicates  that he was instructed to go to [facility]".

## 2017-12-17 NOTE — Addendum Note (Signed)
Addendum  created 12/17/17 0814 by Vista Deck, CRNA   Intraprocedure Flowsheets edited

## 2017-12-17 NOTE — H&P (Signed)
History and Physical    DVID PENDRY VQX:[id number] DOB: [birth year] DOA: 12/17/2017  PCP: [name], MD   Patient coming from: Home  Chief Complaint: High Fever and Urinary Frequency   HPI: [name] is a 57 y.o. male with medical history significant of insulin-dependent diabetes mellitus type 2, chronic kidney disease stage III, chronic diarrhea in the setting of irritable bowel syndrome, history of atrial flutter, history of CAD with stents and history of MI, history of combined chronic systolic and diastolic heart failure, history of multiple myeloma currently in remission, history of morbid obesity, obstructive sleep apnea on CPAP, gout, hypertension, hyperlipidemia, GERD, history of Fournier's gangrene and scrotal abscess and other comorbidities who presented to [facility] emergency room with a chief complaint of fever postoperatively after his stent removal for nephrolithiasis.  Patient was in [place] yesterday and underwent right ureteroscopy and removal of stent by Dr. Alyson Ingles.  Patient went home and fever started trending upwards and this morning is 101 so wife was concerned and brought him to the emergency room for evaluation.  Patient states that since his stent removal he has had no burning or discomfort where he has had urinary frequency and urgency.  Initially he started having some hematuria but it started clearing up.  Denied any nausea, vomiting diarrhea.  Denies any cough or shortness of breath.  States that he did have some flank pain yesterday since his procedure and took some Tylenol for fever.  Patient's family called urology office this morning who recommended patient come to the emergency room for evaluation.  Initially patient was not septic appearing however sepsis protocol was called when patient's blood pressure dropped.  He has bolused 2.5 L and [facility] was called to admit this patient for sepsis secondary likely complicated urinary tract infection  postoperatively.  ED Course: Had basic blood work done and was given 2.5 L boluses.  Also started on IV cefepime and sepsis protocol was called.  Review of Systems: As per HPI otherwise 10 point review of systems negative.   Past Medical History:  Diagnosis Date  . Abscess 04/2017   Scrotal  . Anemia   . Arteriosclerotic cardiovascular disease (ASCVD)    MI-2000s; stent to the proximal LAD and diagonal in 2001; stress nuclear in 2008-impaired exercise capacity, left ventricular dilatation, moderately to severely depressed EF, apical, inferior and anteroseptal scar  . Arthritis   . Atrial flutter ([place])   . 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 Cellulitis of leg    both legs  . Chronic diarrhea   . Chronic kidney disease, stage 3, mod decreased GFR (HCC)    Creatinine of 1.84 in 06/2011 and 1.5 in 07/2011  . Diabetes mellitus    Insulin  . Dysrhythmia    AFlutter  . GERD (gastroesophageal reflux disease)   . Gout   . Hyperlipidemia   . Hypertension   . Injection site reaction   . Multiple myeloma 07/01/2011  . Myocardial infarction ([place]) 2000  . Obesity   . Pedal edema    Venous insufficiency  . Sleep apnea    uses cpap  . Ulcer    Past Surgical History:  Procedure Laterality Date  . ABSCESS DRAINAGE     Scrotal  . BIOPSY  01/02/2012   Procedure: BIOPSY;  Surgeon: Rogene Houston, MD;  [facility];  Service: Endoscopy;  Laterality: N/A;  . BONE MARROW BIOPSY  05/13/11  . CARDIAC CATHETERIZATION     cardiac stent  .  CARDIOVERSION N/A 10/13/2012   Procedure: CARDIOVERSION;  Surgeon: Yehuda Savannah, MD;  Location: [facility] ORS;  Service: Cardiovascular;  Laterality: N/A;  . CATARACT EXTRACTION W/PHACO Left 02/13/2014   Procedure: CATARACT EXTRACTION PHACO AND INTRAOCULAR LENS PLACEMENT ([place]);  Surgeon: Tonny Branch, MD;  Location: [facility] ORS;  Service: Ophthalmology;  Laterality: Left;  CDE:  7.67  . CATARACT EXTRACTION W/PHACO Right 03/02/2014   Procedure: CATARACT  EXTRACTION PHACO AND INTRAOCULAR LENS PLACEMENT RIGHT EYE CDE=16.81;  Surgeon: Tonny Branch, MD;  Location: [facility];  Service: Ophthalmology;  Laterality: Right;  . COLONOSCOPY  11/28/2011   Procedure: COLONOSCOPY;  Surgeon: Rogene Houston, MD;  [facility];  Service: Endoscopy;  Laterality: N/A;  930  . CYSTOSCOPY W/ RETROGRADES Right 12/16/2017   Procedure: CYSTOSCOPY WITH RIGHT RETROGRADE PYELOGRAM AND RIGHT URETERAL STENT REMOVAL;  Surgeon: Cleon Gustin, MD;  Location: [facility];  Service: Urology;  Laterality: Right;  . CYSTOSCOPY W/ URETERAL STENT PLACEMENT Right 07/31/2017   Procedure: CYSTOSCOPY WITH RETROGRADE PYELOGRAM/URETERAL STENT PLACEMENT;  Surgeon: Cleon Gustin, MD;  Location: [facility];  Service: Urology;  Laterality: Right;  . ESOPHAGOGASTRODUODENOSCOPY  01/02/2012   Procedure: ESOPHAGOGASTRODUODENOSCOPY (EGD);  Surgeon: Rogene Houston, MD;  [facility];  Service: Endoscopy;  Laterality: N/A;  100  . ESOPHAGOGASTRODUODENOSCOPY N/A 09/20/2012   Procedure: ESOPHAGOGASTRODUODENOSCOPY (EGD);  Surgeon: Rogene Houston, MD;  [facility];  Service: Endoscopy;  Laterality: N/A;  . EUS N/A 10/07/2012   Procedure: UPPER ENDOSCOPIC ULTRASOUND (EUS) LINEAR;  Surgeon: Milus Banister, MD;  [facility];  Service: Endoscopy;  Laterality: N/A;  . INCISION AND DRAINAGE ABSCESS N/A 04/14/2017   Procedure: INCISION AND DRAINAGE ABSCESS;  Surgeon: Ceasar Mons, MD;  [facility];  Service: Urology;  Laterality: N/A;  . INCISION AND DRAINAGE ABSCESS ANAL    . IRRIGATION AND DEBRIDEMENT ABSCESS N/A 04/15/2017   Procedure: DEBRIDEMENT SCROTAL WOUND AND DRESSING CHANGE;  Surgeon: Ceasar Mons, MD;  [facility];  Service: Urology;  Laterality: N/A;  . IRRIGATION AND DEBRIDEMENT ABSCESS N/A 04/17/2017   Procedure: IRRIGATION AND DEBRIDEMENT ABSCESS;  Surgeon: Ceasar Mons, MD;  [facility];  Service: Urology;   Laterality: N/A;  RM 3  . LAPAROSCOPIC GASTRIC BANDING  2006   has been removed  . OSTECTOMY Right 04/08/2017   Procedure: OSTECTOMY RIGHT GREAT TOE;  Surgeon: Caprice Beaver, DPM;  Location: [facility];  Service: Podiatry;  Laterality: Right;  . PORT-A-CATH REMOVAL Left 12/07/2012   Procedure: REMOVAL PORT-A-CATH;  Surgeon: Scherry Ran, MD;  Location: [facility];  Service: General;  Laterality: Left;  . PORT-A-CATH REMOVAL N/A 10/02/2017   Procedure: MINOR REMOVAL PORT-A-CATH AT BEDSIDE;  Surgeon: Donnie Mesa, MD;  Location: [place];  Service: General;  Laterality: N/A;  . PORTACATH PLACEMENT  07/07/2011   Procedure: INSERTION PORT-A-CATH;  Surgeon: Scherry Ran, MD;  Location: [facility];  Service: General;  Laterality: N/A;  . PORTACATH PLACEMENT N/A 12/07/2012   Procedure: INSERTION PORT-A-CATH;  Surgeon: Scherry Ran, MD;  Location: [facility];  Service: General;  Laterality: N/A;  Attempted portacath placement on left and right side  . URETEROSCOPY Right 12/16/2017   Procedure: DIAGNOSTIC RIGHT URETEROSCOPY;  Surgeon: Cleon Gustin, MD;  Location: [facility];  Service: Urology;  Laterality: Right;  . WOUND DEBRIDEMENT Right 04/08/2017   Procedure: EXCISION ULCERATION RIGHT GREAT TOE;  Surgeon: Caprice Beaver, DPM;  Location: [facility];  Service: Podiatry;  Laterality:  Right;  Marland Kitchen WRIST SURGERY     Left; removal of bone fragment   SOCIAL HISTORY  reports that he quit smoking about 16 years ago. His smoking use included cigarettes and cigars. He has a 0.25 pack-year smoking history. He has never used smokeless tobacco. He reports that he does not drink alcohol or use drugs.  Allergies  Allergen Reactions  . Beef-Derived Products Diarrhea  . Pork-Derived Products Diarrhea   Family History  Problem Relation Age of Onset  . Heart disease Mother   . Cancer Mother   . Diabetes Father   . Arthritis Unknown   . Anesthesia problems Neg Hx   . Hypotension Neg Hx   . Malignant  hyperthermia Neg Hx   . Pseudochol deficiency Neg Hx    Prior to Admission medications   Medication Sig Start Date End Date Taking? Authorizing Provider  acetaminophen (TYLENOL) 500 MG tablet Take 500-1,000 mg by mouth every 6 (six) hours as needed for mild pain or moderate pain.   Yes [provider]  acyclovir (ZOVIRAX) 400 MG tablet Take 1 tablet (400 mg total) by mouth every morning. 02/18/17  Yes Twana First, MD  allopurinol (ZYLOPRIM) 300 MG tablet TAKE ONE TABLET BY MOUTH ONCE DAILY. 09/15/17  Yes Holley Bouche, NP  Artificial Tear Ointment (DRY EYES OP) Place 1 drop into both eyes as needed (dry eyes).   Yes [provider]  aspirin EC 81 MG tablet Take 81 mg by mouth daily.   Yes [provider]  atorvastatin (LIPITOR) 80 MG tablet TAKE ONE TABLET BY MOUTH AT BEDTIME. 12/18/16  Yes Herminio Commons, MD  calcitRIOL (ROCALTROL) 0.25 MCG capsule Take 0.25 mcg by mouth every Monday.    Yes [provider]  Calcium Carbonate (CALCIUM 600 PO) Take 1 tablet by mouth 2 (two) times daily.   Yes [provider]  clotrimazole (LOTRIMIN) 1 % cream Apply 1 application topically 2 (two) times daily.   Yes [provider]  dexlansoprazole (DEXILANT) 60 MG capsule Take 60 mg by mouth every morning.   Yes [provider]  dicyclomine (BENTYL) 20 MG tablet TAKE 1 TABLET BY MOUTH BEFORE MEALS 3 TIMES DAILY. 09/11/17  Yes Rehman, Mechele Dawley, MD  Eluxadoline (VIBERZI) 100 MG TABS Take 100 mg 2 (two) times daily with a meal by mouth.    Yes [provider]  gabapentin (NEURONTIN) 300 MG capsule Take 1 capsule (300 mg total) by mouth 2 (two) times daily. 04/22/17  Yes Short, Noah Delaine, MD  insulin detemir (LEVEMIR) 100 UNIT/ML injection Inject 0.1 mLs (10 Units total) into the skin every morning. 04/22/17  Yes Short, Noah Delaine, MD  insulin lispro (HUMALOG) 100 UNIT/ML injection Inject 0-0.1 mLs (0-10 Units total) into the skin 2 (two)  times daily with a meal. Sliding Scale per patient  CBG 150-200: 1 units CBG 201-275:  3 units CBG 275-350: 5 units 351-425: 7 units 425-500: 9 units 500+: 10 units 04/22/17  Yes Short, Mackenzie, MD  loratadine (CLARITIN) 10 MG tablet Take 10 mg by mouth every morning.    Yes [provider]  Magnesium Cl-Calcium Carbonate (SLOW-MAG PO) Take 1 tablet by mouth every morning.   Yes [provider]  Multiple Vitamins-Minerals (MULTIVITAMINS THER. W/MINERALS) TABS Take 1 tablet by mouth daily.    Yes [provider]  nitroGLYCERIN (NITROSTAT) 0.4 MG SL tablet DISSOLVE 1 TABLET UNDER TONGUE EVERY 5 MINUTES UP TO 15 MIN FOR CHESTPAIN. IF NO RELIEF CALL 911.  05/11/17  Yes Holley Bouche, NP  oxyCODONE-acetaminophen (PERCOCET) 5-325 MG tablet Take 1 tablet by mouth every 4 (four) hours as needed for severe pain. 12/16/17 12/16/18 Yes McKenzie, Candee Furbish, MD  potassium chloride SA (K-DUR,KLOR-CON) 20 MEQ tablet Take 1 tablet (20 mEq total) by mouth daily. Patient taking differently: Take 20 mEq by mouth 2 (two) times daily.  08/05/17  Yes Lavina Hamman, MD  torsemide (DEMADEX) 20 MG tablet Take 1 tablet (20 mg total) by mouth 2 (two) times daily. Take extra 1 tab for weight gain of 3 lbs in 1 day or 5 lbs in 2 days 10/08/17  Yes Rai, Ripudeep K, MD  Vitamin D, Ergocalciferol, (DRISDOL) 50000 units CAPS capsule TAKE 1 CAPSULE BY MOUTH EVERY THIRTY DAYS. 05/11/17  Yes Holley Bouche, NP  XARELTO 20 MG TABS tablet TAKE 1 TABLET BY MOUTH DAILY. 09/21/17  Yes Herminio Commons, MD   Physical Exam: Vitals:   12/17/17 1400 12/17/17 1412 12/17/17 1430 12/17/17 1436  BP: (!) 110/59  (!) 98/50 (!) 98/50  Pulse: 78  71 66  Resp: (!) 26  (!) 34 (!) 24  Temp:  (!) 102.9 F (39.4 C)    TempSrc:  Rectal    SpO2: 99%  96% 99%  Weight:      Height:       Constitutional: WN/WD morbidly obese AAM in NAD and appears calm Eyes: Lids and conjunctivae normal, sclerae anicteric    ENMT: External Ears, Nose appear normal. Grossly normal hearing. Mucous membranes are moist.  Neck: Appears normal, supple, no cervical masses, normal ROM, no appreciable thyromegaly, no JVD Respiratory: Diminished to auscultation bilaterally, no wheezing, rales, rhonchi or crackles. Cardiovascular: Irregularly Irregular, Slight 2/6 Murmur. S1 and S2 auscultated. Has some extremity edema wearing compression stockings.  Abdomen: Soft, slightly tender, Distended 2/2 to body habitus. No masses palpated. No appreciable hepatosplenomegaly. Bowel sounds positive x4.  GU: Deferred. Musculoskeletal: No clubbing / cyanosis of digits/nails. No joint deformity upper and lower extremities. Skin: No rashes, lesions, ulcers on a limited skin eval. No induration; Warm and dry.  Neurologic: CN 2-12 grossly intact with no focal deficits. Romberg sign and cerebellar reflexes not assessed.  Psychiatric: Normal judgment and insight. Alert and oriented x 3. Normal mood and appropriate affect.   Labs on Admission: I have personally reviewed following labs and imaging studies  CBC: Recent Labs  Lab 12/11/17 0807 12/17/17 1323  WBC 6.0 10.3  NEUTROABS  --  7.0  HGB 10.0* 10.5*  HCT 32.1* 33.2*  MCV 97.0 96.2  PLT 203 749   Basic Metabolic Panel: Recent Labs  Lab 12/11/17 0807 12/11/17 0940 12/17/17 1323  NA 142 143 141  K 4.0 4.0 4.3  CL 111 112* 110  CO2 '22 22 24  ' GLUCOSE 137* 136* 129*  BUN 36* 37* 39*  CREATININE 1.74* 1.80* 2.11*  CALCIUM 8.7* 8.8*  8.8 8.6*  PHOS  --  5.2*  --    GFR: Estimated Creatinine Clearance: 60.5 mL/min (A) (by C-G formula based on SCr of 2.11 mg/dL (H)). Liver Function Tests: Recent Labs  Lab 12/11/17 0940 12/17/17 1323  AST  --  41  ALT  --  47*  ALKPHOS  --  115  BILITOT  --  0.6  PROT  --  7.1  ALBUMIN 3.2* 3.3*   No results for input(s): LIPASE, AMYLASE in the last 168 hours. No results for input(s): AMMONIA in the last 168 hours. Coagulation  Profile:  Recent Labs  Lab 12/17/17 1323  INR 2.68   Cardiac Enzymes: Recent Labs  Lab 12/17/17 1323  TROPONINI 0.10*   BNP (last 3 results) No results for input(s): PROBNP in the last 8760 hours. HbA1C: No results for input(s): HGBA1C in the last 72 hours. CBG: Recent Labs  Lab 12/11/17 0821 12/16/17 1108 12/16/17 1315  GLUCAP 124* 121* 111*   Lipid Profile: No results for input(s): CHOL, HDL, LDLCALC, TRIG, CHOLHDL, LDLDIRECT in the last 72 hours. Thyroid Function Tests: No results for input(s): TSH, T4TOTAL, FREET4, T3FREE, THYROIDAB in the last 72 hours. Anemia Panel: No results for input(s): VITAMINB12, FOLATE, FERRITIN, TIBC, IRON, RETICCTPCT in the last 72 hours. Urine analysis:    Component Value Date/Time   COLORURINE YELLOW 12/17/2017 1323   APPEARANCEUR CLOUDY (A) 12/17/2017 1323   LABSPEC 1.009 12/17/2017 1323   PHURINE 5.0 12/17/2017 1323   GLUCOSEU NEGATIVE 12/17/2017 1323   HGBUR LARGE (A) 12/17/2017 1323   BILIRUBINUR NEGATIVE 12/17/2017 1323   KETONESUR NEGATIVE 12/17/2017 1323   PROTEINUR 100 (A) 12/17/2017 1323   UROBILINOGEN 0.2 10/03/2012 1809   NITRITE NEGATIVE 12/17/2017 1323   LEUKOCYTESUR LARGE (A) 12/17/2017 1323   Sepsis Labs: !!!!!!!!!!!!!!!!!!!!!!!!!!!!!!!!!!!!!!!!!!!! '@[email or web address]' (procalcitonin:4,lacticidven:4) )No results found for this or any previous visit (from the past 240 hour(s)).   Radiological Exams on Admission: Dg Chest 2 View  Result Date: 12/17/2017 CLINICAL DATA:  Sepsis.  Fever. EXAM: CHEST - 2 VIEW COMPARISON:  11/09/2017 FINDINGS: Cardiomegaly. Left diaphragmatic hernia containing large bowel again noted, similar to prior study. Adjacent left base opacity likely reflects atelectasis. Mild vascular congestion. No overt edema. IMPRESSION: Cardiomegaly.  Vascular congestion. Left diaphragmatic hernia containing large bowel. Adjacent left base atelectasis. Electronically Signed   By: Rolm Baptise M.D.   On: 12/17/2017 14:11     Dg C-arm 1-60 Min-no Report  Result Date: 12/16/2017 Fluoroscopy was utilized by the requesting physician.  No radiographic interpretation.   EKG: Independently reviewed.  Showed an irregularly irregular rhythm with some artifact and approximate rate of 76.  No appreciable ST elevation or depression on my interpretation  Assessment/Plan Active Problems:   Insulin dependent diabetes mellitus (HCC)   Morbid obesity (HCC)   Sleep apnea   Arteriosclerotic cardiovascular disease (ASCVD)   Hypertension   Hyperlipidemia   Atrial flutter (HCC)   Multiple myeloma in remission (HCC)   CKD (chronic kidney disease) stage 3, GFR 30-59 ml/min (HCC)   Chronic systolic CHF (congestive heart failure) (HCC)   Sepsis secondary to UTI ([place])  Sepsis 2/2 to Complicated UTI -Admit to Telemetry -Patient met Sepsis Criteria on Admission being Febrile, Tachypenic, and having a Source of Infection in the Urine; recently had instrumentation and stent (for Nephrolithiasis) removal yesterday -Sepsis protocol initiated in the emergency room patient was given 30 mix per kick of ideal body weight -Initially was bolused 500 mL's and then given 2 L of bolus of IV normal saline and became slightly hypotensive -Continue with IV fluid maintenance hydration with rate of 75 mL/hr x1 day -Urinalysis showed cloudy appearance of the urine, large hemoglobin, large leukocytes, negative nitrites, rare bacteria, greater than 50 RBCs per high-power field, and greater than 50 WBCs -Blood cultures x2 obtained and have been sent off as well as urine culture pending -Start empiric antibiotics with IV cefepime and will continue per pharmacy to dose -Check procalcitonin level and trend lactic acid levels. -Discussed with Dr. Junious Silk of urology who recommends obtaining a renal ultrasound to rule out hydronephrosis -Renal ultrasound has  been ordered and pending -Continue to follow cultures, trend fever curve and repeat CBC in  a.m.  AKI on CKD stage 3 -Upon further review baseline appears to be 1.5-1.8 -Patient presented with a BUN/creatinine 39/2.11 -Continue with IV fluid hydration as above -Avoid nephrotoxic medications if possible -Continue with Calcitriol 0.25 mg p.o. every Monday -Repeat CMP in a.m.  Mildly Elevated Troponin -Troponin elevation was 0.10 -Patient denies any chest pain at this time and there is no ischemic changes on EKG per my interpretation -Trend troponin I x3 -Likely demand ischemia or in the setting of acute on chronic kidney disease  Insulin-Dependent Diabetes Mellitus Type 2 -Continue with home insulin detemir 10 mg subcu every morning and hold home sliding scale -Placed on Moderate NovoLog sliding scale insulin before meals and at bedtime -Last hemoglobin A1c on 11/10/2017 was 6.3 -Continue to monitor CBGs and adjust insulin regimen accordingly  A Flutter/Atrial Fibrillation -EKG shows patient has atrial fibrillation -Continue with anticoagulation with rivaroxaban -Not on any beta blocking agents -C/w Telemetry   Chronic Diarrhea/IBS -Continue with Eluxadoline 100 mg po BID  Hx of Multiple Myeloma -Currently in Remission  Essential HTN  -Currently not on any Antihypertensives per Medication review -Contunue to Monitor Blood Pressure  Gout -Continue with allopurinol 300 mg p.o. daily and may need to be renally adjusted if creatinine still continues to climb  GERD -Continue with Dexilant substitution with Protonix 40 mg p.o. Daily  Hyperlipidemia -Continue with atorvastatin  Arteriosclerotic Cardiovascular Disease/CAD and History of MI -Status post stent to LAD and diagonal in 2001 -Continue with aspirin 81 mg, Atorvastatin 80 mg po qHS, NTG 0.4 mg SL q47mn PRN Chest Pain  Chronic Combined Systolic and Diastolic CHF -Currently not Decompensated -Hold Home Torsemide 20 mg po BID currently -Strict I's and O's, Daily Weights -Continue to Monitor Volume Status  extremely carefully now that Sepsis Protocol has been initiated and patient volume resuscitated   OSA -C/w CPAP  DVT prophylaxis: Anticoagulated with Rivaroxaban  Code Status: FULL CODE Family Communication: Discussed with wife at bedside  Disposition Plan: Anticipate D/C Home in the next 48 hours if medically stable Consults called: Urology Dr. MFestus AloeAdmission status: Inpatient Telemetry  Severity of Illness: The appropriate patient status for this patient is INPATIENT. Inpatient status is judged to be reasonable and necessary in order to provide the required intensity of service to ensure the patient's safety. The patient's presenting symptoms, physical exam findings, and initial radiographic and laboratory data in the context of their chronic comorbidities is felt to place them at high risk for further clinical deterioration. Furthermore, it is not anticipated that the patient will be medically stable for discharge from the hospital within 2 midnights of admission. The following factors support the patient status of inpatient.   " The patient's presenting symptoms include Fever and Urinary Frequency. " The worrisome physical exam findings include mild abdominal pain. " The initial radiographic and laboratory data are worrisome because of AKI on CKD and concern for UTI. " The chronic co-morbidities include CHF, A Flutter/A Fib, Hx of Multiple Myeloma, Insulin Dependent DM.   * I certify that at the point of admission it is my clinical judgment that the patient will require inpatient hospital care spanning beyond 2 midnights from the point of admission due to high intensity of service, high risk for further deterioration and high frequency of surveillance required.*Kerney Elbe D.O. [facility] Pager [phone] If 7PM-7AM, please contact night-coverage [email or web address] Password  [email or web address]  12/17/2017, 4:51 PM

## 2017-12-17 NOTE — ED Triage Notes (Signed)
Pt had a stent in kidney removed yesterday. Last night had a 100 fever. This morning called Dr. Cain Saupe and told pt to come to ED

## 2017-12-17 NOTE — Progress Notes (Signed)
A consult was received from an ED physician for Cefepime per pharmacy dosing (for an indication other than meningitis). The patient's profile has been reviewed for ht/wt/allergies/indication/available labs. A one time order has been placed for the above antibiotics.  Further antibiotics/pharmacy consults should be ordered by admitting physician if indicated.                       Reuel Boom, PharmD, BCPS [phone] 12/17/2017, 3:22 PM

## 2017-12-17 NOTE — Progress Notes (Signed)
Pharmacy Antibiotic Note  [name] is a 57 y.o. male admitted on 12/17/2017 with sepsis d/t UTI.  Pharmacy has been consulted for Cefepime dosing.  Plan: Cefepime 2 g IV q12 hr (using higher dose d/t weight)   Height: 6\' 2"  (188 cm) Weight: (!) 346 lb (156.9 kg) IBW/kg (Calculated) : 82.2  Temp (24hrs), Avg:101.9 F (38.8 C), Min:99.8 F (37.7 C), Max:103 F (39.4 C)  Recent Labs  Lab 12/11/17 0807 12/11/17 0940 12/17/17 1323 12/17/17 1338  WBC 6.0  --  10.3  --   CREATININE 1.74* 1.80* 2.11*  --   LATICACIDVEN  --   --   --  1.46    Estimated Creatinine Clearance: 60.5 mL/min (A) (by C-G formula based on SCr of 2.11 mg/dL (H)).    Allergies  Allergen Reactions  . Beef-Derived Products Diarrhea  . Pork-Derived Products Diarrhea    Antimicrobials this admission: 7/18 Cefepime >>   Dose adjustments this admission: n/a  Microbiology results: 7/18 BCx: sent 7/18 UCx: sent    Thank you for allowing pharmacy to be a part of this patient's care.  Reuel Boom, PharmD, BCPS [phone] 12/17/2017, 5:39 PM

## 2017-12-17 NOTE — Consult Note (Signed)
Urology Consult Note   Requesting Attending Physician:  Kerney Elbe, DO Service Providing Consult: Urology  Consulting Attending: Dr. Festus Aloe, MD   Reason for Consult:  Sepsis of urinary origin  HPI: [name] is seen in consultation for reasons noted above at the request of Kerney Elbe, DO for evaluation of suspected sepsis of urinary origin following instrumentation of urinary tract.  This is a 57 y.o. male with multiple medical co-morbidities as detailed below who presents with fevers and generalize malaise following right ureteroscopy and stent removal yesterday.  Patient known to urology for his recent history of infected/obstrucing right ureteral stone requiring stent placement in 07/2017. Notably, patient has had multiple UTIs over the past 6-7 months including E coli, Klebsiella and citrobacter with resistance patterns. Additionally, patient with history of Fournier's gangrene requiring serial debridement in 04/2017. He states he has recovered well overall, but does have some persistent scrotal wall tenderness.  Yesterday, he underwent right ureteroscopy without evidence of stone. His ureteral stent was removed. He received CTX peri-operatively. Last night he began to feel poorly and developed fevers. He presented to the ED today for evaluation. He denies nausea/vomiting. No flank pain. No dysuria. Voiding subjectively well.   Past Medical History: Past Medical History:  Diagnosis Date  . Abscess 04/2017   Scrotal  . Anemia   . Arteriosclerotic cardiovascular disease (ASCVD)    MI-2000s; stent to the proximal LAD and diagonal in 2001; stress nuclear in 2008-impaired exercise capacity, left ventricular dilatation, moderately to severely depressed EF, apical, inferior and anteroseptal scar  . Arthritis   . Atrial flutter ([place])   . 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 Cellulitis of leg    both legs  . Chronic diarrhea   . Chronic kidney disease,  stage 3, mod decreased GFR (HCC)    Creatinine of 1.84 in 06/2011 and 1.5 in 07/2011  . Diabetes mellitus    Insulin  . Dysrhythmia    AFlutter  . GERD (gastroesophageal reflux disease)   . Gout   . Hyperlipidemia   . Hypertension   . Injection site reaction   . Multiple myeloma 07/01/2011  . Myocardial infarction ([place]) 2000  . Obesity   . Pedal edema    Venous insufficiency  . Sleep apnea    uses cpap  . Ulcer     Past Surgical History:  Past Surgical History:  Procedure Laterality Date  . ABSCESS DRAINAGE     Scrotal  . BIOPSY  01/02/2012   Procedure: BIOPSY;  Surgeon: Rogene Houston, MD;  [facility];  Service: Endoscopy;  Laterality: N/A;  . BONE MARROW BIOPSY  05/13/11  . CARDIAC CATHETERIZATION     cardiac stent  . CARDIOVERSION N/A 10/13/2012   Procedure: CARDIOVERSION;  Surgeon: Yehuda [place], MD;  Location: [facility];  Service: Cardiovascular;  Laterality: N/A;  . CATARACT EXTRACTION W/PHACO Left 02/13/2014   Procedure: CATARACT EXTRACTION PHACO AND INTRAOCULAR LENS PLACEMENT ([place]);  Surgeon: Tonny Branch, MD;  Location: [facility];  Service: Ophthalmology;  Laterality: Left;  CDE:  7.67  . CATARACT EXTRACTION W/PHACO Right 03/02/2014   Procedure: CATARACT EXTRACTION PHACO AND INTRAOCULAR LENS PLACEMENT RIGHT EYE CDE=16.81;  Surgeon: Tonny Branch, MD;  Location: [facility];  Service: Ophthalmology;  Laterality: Right;  . COLONOSCOPY  11/28/2011   Procedure: COLONOSCOPY;  Surgeon: Rogene Houston, MD;  [facility];  Service: Endoscopy;  Laterality: N/A;  930  . CYSTOSCOPY W/ RETROGRADES Right 12/16/2017  Procedure: CYSTOSCOPY WITH RIGHT RETROGRADE PYELOGRAM AND RIGHT URETERAL STENT REMOVAL;  Surgeon: Cleon Gustin, MD;  [facility]: [facility];  Service: Urology;  Laterality: Right;  . CYSTOSCOPY W/ URETERAL STENT PLACEMENT Right 07/31/2017   Procedure: CYSTOSCOPY WITH RETROGRADE PYELOGRAM/URETERAL STENT PLACEMENT;  Surgeon: Cleon Gustin, MD;  [facility];  Service: Urology;  Laterality: Right;  . ESOPHAGOGASTRODUODENOSCOPY  01/02/2012   Procedure: ESOPHAGOGASTRODUODENOSCOPY (EGD);  Surgeon: Rogene Houston, MD;  [facility];  Service: Endoscopy;  Laterality: N/A;  100  . ESOPHAGOGASTRODUODENOSCOPY N/A 09/20/2012   Procedure: ESOPHAGOGASTRODUODENOSCOPY (EGD);  Surgeon: Rogene Houston, MD;  [facility];  Service: Endoscopy;  Laterality: N/A;  . EUS N/A 10/07/2012   Procedure: UPPER ENDOSCOPIC ULTRASOUND (EUS) LINEAR;  Surgeon: Milus Banister, MD;  [facility];  Service: Endoscopy;  Laterality: N/A;  . INCISION AND DRAINAGE ABSCESS N/A 04/14/2017   Procedure: INCISION AND DRAINAGE ABSCESS;  Surgeon: Ceasar Mons, MD;  [facility];  Service: Urology;  Laterality: N/A;  . INCISION AND DRAINAGE ABSCESS ANAL    . IRRIGATION AND DEBRIDEMENT ABSCESS N/A 04/15/2017   Procedure: DEBRIDEMENT SCROTAL WOUND AND DRESSING CHANGE;  Surgeon: Ceasar Mons, MD;  [facility];  Service: Urology;  Laterality: N/A;  . IRRIGATION AND DEBRIDEMENT ABSCESS N/A 04/17/2017   Procedure: IRRIGATION AND DEBRIDEMENT ABSCESS;  Surgeon: Ceasar Mons, MD;  [facility];  Service: Urology;  Laterality: N/A;  RM 3  . LAPAROSCOPIC GASTRIC BANDING  2006   has been removed  . OSTECTOMY Right 04/08/2017   Procedure: OSTECTOMY RIGHT GREAT TOE;  Surgeon: Caprice Beaver, DPM;  [facility];  Service: Podiatry;  Laterality: Right;  . PORT-A-CATH REMOVAL Left 12/07/2012   Procedure: REMOVAL PORT-A-CATH;  Surgeon: Scherry Ran, MD;  [facility];  Service: General;  Laterality: Left;  . PORT-A-CATH REMOVAL N/A 10/02/2017   Procedure: MINOR REMOVAL PORT-A-CATH AT BEDSIDE;  Surgeon: Donnie Mesa, MD;  Location: [facility];  Service: General;  Laterality: N/A;  . PORTACATH PLACEMENT  07/07/2011   Procedure: INSERTION PORT-A-CATH;  Surgeon: Scherry Ran, MD;  [facility];  Service:  General;  Laterality: N/A;  . PORTACATH PLACEMENT N/A 12/07/2012   Procedure: INSERTION PORT-A-CATH;  Surgeon: Scherry Ran, MD;  [facility];  Service: General;  Laterality: N/A;  Attempted portacath placement on left and right side  . URETEROSCOPY Right 12/16/2017   Procedure: DIAGNOSTIC RIGHT URETEROSCOPY;  Surgeon: Cleon Gustin, MD;  [facility];  Service: Urology;  Laterality: Right;  . WOUND DEBRIDEMENT Right 04/08/2017   Procedure: EXCISION ULCERATION RIGHT GREAT TOE;  Surgeon: Caprice Beaver, DPM;  [facility];  Service: Podiatry;  Laterality: Right;  . WRIST SURGERY     Left; removal of bone fragment    Medication: Current Facility-Administered Medications  Medication Dose Route Frequency Provider Last Rate Last Dose  . sodium chloride 0.9 % bolus 1,000 mL  1,000 mL Intravenous Once Langston Masker B, PA-C 2,000 mL/hr at 12/17/17 1829 1,000 mL at 12/17/17 1829   Current Outpatient Medications  Medication Sig Dispense Refill  . acetaminophen (TYLENOL) 500 MG tablet Take 500-1,000 mg by mouth every 6 (six) hours as needed for mild pain or moderate pain.    Marland Kitchen acyclovir (ZOVIRAX) 400 MG tablet Take 1 tablet (400 mg total) by mouth every morning. 30 tablet 5  . allopurinol (ZYLOPRIM) 300 MG tablet TAKE ONE TABLET BY MOUTH ONCE DAILY. 30 tablet 2  .  Artificial Tear Ointment (DRY EYES OP) Place 1 drop into both eyes as needed (dry eyes).    Marland Kitchen aspirin EC 81 MG tablet Take 81 mg by mouth daily.    Marland Kitchen atorvastatin (LIPITOR) 80 MG tablet TAKE ONE TABLET BY MOUTH AT BEDTIME. 90 tablet 3  . calcitRIOL (ROCALTROL) 0.25 MCG capsule Take 0.25 mcg by mouth every Monday.     . Calcium Carbonate (CALCIUM 600 PO) Take 1 tablet by mouth 2 (two) times daily.    . clotrimazole (LOTRIMIN) 1 % cream Apply 1 application topically 2 (two) times daily.    Marland Kitchen dexlansoprazole (DEXILANT) 60 MG capsule Take 60 mg by mouth every morning.    . dicyclomine (BENTYL) 20 MG tablet TAKE 1  TABLET BY MOUTH BEFORE MEALS 3 TIMES DAILY. 90 tablet 3  . Eluxadoline (VIBERZI) 100 MG TABS Take 100 mg 2 (two) times daily with a meal by mouth.     . gabapentin (NEURONTIN) 300 MG capsule Take 1 capsule (300 mg total) by mouth 2 (two) times daily. 60 capsule 0  . insulin detemir (LEVEMIR) 100 UNIT/ML injection Inject 0.1 mLs (10 Units total) into the skin every morning. 10 mL 11  . insulin lispro (HUMALOG) 100 UNIT/ML injection Inject 0-0.1 mLs (0-10 Units total) into the skin 2 (two) times daily with a meal. Sliding Scale per patient  CBG 150-200: 1 units CBG 201-275:  3 units CBG 275-350: 5 units 351-425: 7 units 425-500: 9 units 500+: 10 units 10 mL 0  . loratadine (CLARITIN) 10 MG tablet Take 10 mg by mouth every morning.     . Magnesium Cl-Calcium Carbonate (SLOW-MAG PO) Take 1 tablet by mouth every morning.    . Multiple Vitamins-Minerals (MULTIVITAMINS THER. W/MINERALS) TABS Take 1 tablet by mouth daily.     . nitroGLYCERIN (NITROSTAT) 0.4 MG SL tablet DISSOLVE 1 TABLET UNDER TONGUE EVERY 5 MINUTES UP TO 15 MIN FOR CHESTPAIN. IF NO RELIEF CALL 911. 25 tablet 0  . oxyCODONE-acetaminophen (PERCOCET) 5-325 MG tablet Take 1 tablet by mouth every 4 (four) hours as needed for severe pain. 15 tablet 0  . potassium chloride SA (K-DUR,KLOR-CON) 20 MEQ tablet Take 1 tablet (20 mEq total) by mouth daily. (Patient taking differently: Take 20 mEq by mouth 2 (two) times daily. ) 30 tablet 0  . torsemide (DEMADEX) 20 MG tablet Take 1 tablet (20 mg total) by mouth 2 (two) times daily. Take extra 1 tab for weight gain of 3 lbs in 1 day or 5 lbs in 2 days 90 tablet 3  . Vitamin D, Ergocalciferol, (DRISDOL) 50000 units CAPS capsule TAKE 1 CAPSULE BY MOUTH EVERY THIRTY DAYS. 3 capsule PRN  . XARELTO 20 MG TABS tablet TAKE 1 TABLET BY MOUTH DAILY. 30 tablet 6   Facility-Administered Medications Ordered in Other Encounters  Medication Dose Route Frequency Provider Last Rate Last Dose  . sodium chloride  0.9 % injection 10 mL  10 mL Intravenous Once Farrel Gobble, MD        Allergies: Allergies  Allergen Reactions  . Beef-Derived Products Diarrhea  . Pork-Derived Products Diarrhea    Social History: Social History   Tobacco Use  . Smoking status: Former Smoker    Packs/day: 0.25    Years: 1.00    Pack years: 0.25    Types: Cigarettes, Cigars    Last attempt to quit: 05/17/2001    Years since quitting: 16.5  . Smokeless tobacco: Never Used  Substance Use Topics  . Alcohol  use: No    Alcohol/week: 0.0 oz  . Drug use: No    Family History Family History  Problem Relation Age of Onset  . Heart disease Mother   . Cancer Mother   . Diabetes Father   . Arthritis Unknown   . Anesthesia problems Neg Hx   . Hypotension Neg Hx   . Malignant hyperthermia Neg Hx   . Pseudochol deficiency Neg Hx     Review of Systems 10 systems were reviewed and are negative except as noted specifically in the HPI.  Objective   Vital signs in last 24 hours: BP (!) 124/97 (BP Location: Right Arm)   Pulse 68   Temp (!) 102.9 F (39.4 C) (Rectal)   Resp 20   Ht '6\' 2"'$  (1.88 m)   Wt (!) 156.9 kg (346 lb)   SpO2 100%   BMI 44.42 kg/m   Physical Exam General: NAD, A&O, resting, appropriate HEENT: [place]/AT, EOMI, MMM Pulmonary: Normal work of breathing Cardiovascular: HDS, adequate peripheral perfusion Abdomen: Soft, NTTP, nondistended, obese GU: No CVA tenderness Extremities: warm and well perfused Neuro: Appropriate, no focal neurological deficits  Most Recent Labs: Lab Results  Component Value Date   WBC 10.3 12/17/2017   HGB 10.5 (L) 12/17/2017   HCT 33.2 (L) 12/17/2017   PLT 185 12/17/2017    Lab Results  Component Value Date   NA 141 12/17/2017   K 4.3 12/17/2017   CL 110 12/17/2017   CO2 24 12/17/2017   BUN 39 (H) 12/17/2017   CREATININE 2.11 (H) 12/17/2017   CALCIUM 8.6 (L) 12/17/2017   MG 1.9 10/06/2017   PHOS 5.2 (H) 12/11/2017    Lab Results  Component  Value Date   INR 2.68 12/17/2017   APTT 47 (H) 10/04/2017     IMAGING: Dg Chest 2 View  Result Date: 12/17/2017 CLINICAL DATA:  Sepsis.  Fever. EXAM: CHEST - 2 VIEW COMPARISON:  11/09/2017 FINDINGS: Cardiomegaly. Left diaphragmatic hernia containing large bowel again noted, similar to prior study. Adjacent left base opacity likely reflects atelectasis. Mild vascular congestion. No overt edema. IMPRESSION: Cardiomegaly.  Vascular congestion. Left diaphragmatic hernia containing large bowel. Adjacent left base atelectasis. Electronically Signed   By: Rolm Baptise M.D.   On: 12/17/2017 14:11   US Renal  Result Date: 12/17/2017 CLINICAL DATA:  Acute kidney injury EXAM: RENAL / URINARY TRACT ULTRASOUND COMPLETE COMPARISON:  CT 11/10/2016 FINDINGS: Right Kidney: Length: 14.2 cm. Echogenicity within normal limits. No mass or hydronephrosis visualized. Left Kidney: Length: 13.9 cm. Echogenicity within normal limits. No mass or hydronephrosis visualized. Bladder: Appears normal for degree of bladder distention. IMPRESSION: Unremarkable renal ultrasound.  No hydronephrosis. Electronically Signed   By: Rolm Baptise M.D.   On: 12/17/2017 17:47   Dg C-arm 1-60 Min-no Report  Result Date: 12/16/2017 Fluoroscopy was utilized by the requesting physician.  No radiographic interpretation.    ------  Assessment:  57 y.o. male with recent history of right ureteral stent placement in the setting of 3-41m infected/obstructing mid right ureteral stone who presents with fevers to 103F following diagnostic right ureteroscopy and ureteral stent removal yesterday.  He is afebrile on most recent check (not yet recorded) and HDS. He is non-toxic appearing on exam. Patient currently on cefepime empirically. B/UCx pending. No flank pain. RUS without hydronephrosis. Cr 2.11 (BL 1.6-1.8).   Recommendations: - No acute urologic intervention indicated at this time - Agree with broad spectrum antibiotics for now pending  culture results - Patient will  follow up with urology as outpatient in 2 weeks as previously requested  Thank you for this consult. Please contact the urology consult pager with any further questions/concerns.

## 2017-12-17 NOTE — ED Notes (Signed)
CRITICAL VALUE STICKER  CRITICAL VALUE: Trop 0.10  RECEIVER (on-site recipient of call): Jake T RN  [place] NOTIFIED: [phone]  MESSENGER (representative from lab): Ovid Curd  NOTIFIED: Alyssa PA  TIME OF NOTIFICATION: 2094  RESPONSE: see orders

## 2017-12-18 DIAGNOSIS — I4892 Unspecified atrial flutter: Secondary | ICD-10-CM

## 2017-12-18 LAB — COMPREHENSIVE METABOLIC PANEL
ALT: 29 U/L (ref 0–44)
AST: 23 U/L (ref 15–41)
Albumin: 2.6 g/dL — ABNORMAL LOW (ref 3.5–5.0)
Alkaline Phosphatase: 79 U/L (ref 38–126)
Anion gap: 9 (ref 5–15)
BUN: 36 mg/dL — ABNORMAL HIGH (ref 6–20)
CO2: 20 mmol/L — ABNORMAL LOW (ref 22–32)
Calcium: 8 mg/dL — ABNORMAL LOW (ref 8.9–10.3)
Chloride: 111 mmol/L (ref 98–111)
Creatinine, Ser: 1.86 mg/dL — ABNORMAL HIGH (ref 0.61–1.24)
GFR calc Af Amer: 44 mL/min — ABNORMAL LOW (ref >60)
GFR calc non Af Amer: 38 mL/min — ABNORMAL LOW (ref >60)
Glucose, Bld: 128 mg/dL — ABNORMAL HIGH (ref 70–99)
Potassium: 3.9 mmol/L (ref 3.5–5.1)
Sodium: 140 mmol/L (ref 135–145)
Total Bilirubin: 0.3 mg/dL (ref 0.3–1.2)
Total Protein: 5.5 g/dL — ABNORMAL LOW (ref 6.5–8.1)

## 2017-12-18 LAB — GLUCOSE, CAPILLARY
Glucose-Capillary: 106 mg/dL — ABNORMAL HIGH (ref 70–99)
Glucose-Capillary: 159 mg/dL — ABNORMAL HIGH (ref 70–99)
Glucose-Capillary: 105 mg/dL — ABNORMAL HIGH (ref 70–99)
Glucose-Capillary: 165 mg/dL — ABNORMAL HIGH (ref 70–99)

## 2017-12-18 LAB — CBC WITH DIFFERENTIAL/PLATELET
Basophils Absolute: 0 10*3/uL (ref 0.0–0.1)
Basophils Relative: 0 %
Eosinophils Absolute: 0.2 10*3/uL (ref 0.0–0.7)
Eosinophils Relative: 3 %
HCT: 28.5 % — ABNORMAL LOW (ref 39.0–52.0)
Hemoglobin: 8.9 g/dL — ABNORMAL LOW (ref 13.0–17.0)
Lymphocytes Relative: 23 %
Lymphs Abs: 1.7 10*3/uL (ref 0.7–4.0)
MCH: 30.3 pg (ref 26.0–34.0)
MCHC: 31.2 g/dL (ref 30.0–36.0)
MCV: 96.9 fL (ref 78.0–100.0)
Monocytes Absolute: 1.2 10*3/uL — ABNORMAL HIGH (ref 0.1–1.0)
Monocytes Relative: 16 %
Neutro Abs: 4.5 10*3/uL (ref 1.7–7.7)
Neutrophils Relative %: 58 %
Platelets: 155 10*3/uL (ref 150–400)
RBC: 2.94 MIL/uL — ABNORMAL LOW (ref 4.22–5.81)
RDW: 17 % — ABNORMAL HIGH (ref 11.5–15.5)
WBC: 7.7 10*3/uL (ref 4.0–10.5)

## 2017-12-18 LAB — PHOSPHORUS: Phosphorus: 3.2 mg/dL (ref 2.5–4.6)

## 2017-12-18 LAB — TROPONIN I: Troponin I: 0.17 ng/mL (ref <0.03)

## 2017-12-18 LAB — URINE CULTURE: Culture: NO GROWTH

## 2017-12-18 LAB — MRSA PCR SCREENING: MRSA by PCR: POSITIVE — AB

## 2017-12-18 LAB — MAGNESIUM: Magnesium: 1.8 mg/dL (ref 1.7–2.4)

## 2017-12-18 MED ORDER — MUPIROCIN 2 % EX OINT
1.0000 "application " | TOPICAL_OINTMENT | Freq: Two times a day (BID) | CUTANEOUS | Status: DC
  Administered 2017-12-18 – 2017-12-21 (×6): 1 via NASAL
  Filled 2017-12-18: qty 22

## 2017-12-18 MED ORDER — CHLORHEXIDINE GLUCONATE CLOTH 2 % EX PADS
6.0000 | MEDICATED_PAD | Freq: Every day | CUTANEOUS | Status: DC
  Administered 2017-12-18: 6 via TOPICAL

## 2017-12-18 MED ORDER — SODIUM CHLORIDE 0.9 % IV SOLN
2.0000 g | Freq: Two times a day (BID) | INTRAVENOUS | Status: DC
  Administered 2017-12-18 – 2017-12-19 (×3): 2 g via INTRAVENOUS
  Filled 2017-12-18 (×3): qty 2

## 2017-12-18 NOTE — Progress Notes (Signed)
Patient brought Eluxadoline from home.  Given to pharmacy to be stored and administered.  Sheet placed in pt's shadow chart.

## 2017-12-18 NOTE — Progress Notes (Signed)
CRITICAL VALUE ALERT  Critical Value:  Troponin 0.12  Date & Time Notied:  7/18 2300  Provider Notified: yes  Orders Received/Actions taken:pending

## 2017-12-18 NOTE — Progress Notes (Signed)
PROGRESS NOTE    Patient: [name]     PCP: [name], MD                    DOB: 10-28-1960            DOA: 12/17/2017 FKC:[id number]             DOS: 12/18/2017, 12:15 PM   LOS: 1 day   Date of Service: The patient was seen and examined on 12/18/2017  Subjective:  Patient was seen and examined this morning, stable no acute distress.  No issues overnight. Denies any shortness of breath or chest pain.  Denies any fever chills. Denies of having any active dysuria at this time.  ----------------------------------------------------------------------------------------------------------------------  Brief Narrative:   VELTON ROSELLE is a 57 y.o. male with medical history significant of insulin-dependent diabetes mellitus type 2, chronic kidney disease stage III, chronic diarrhea in the setting of irritable bowel syndrome, history of atrial flutter, history of CAD with stents and history of MI, history of combined chronic systolic and diastolic heart failure, history of multiple myeloma currently in remission, history of morbid obesity, obstructive sleep apnea on CPAP, gout, hypertension, hyperlipidemia, GERD, history of Fournier's gangrene and scrotal abscess and other comorbidities who presented to [facility] emergency room with a chief complaint of fever postoperatively after his stent removal for nephrolithiasis.  Patient was in [place] yesterday and underwent right ureteroscopy and removal of stent by Dr. Alyson Ingles.  Patient went home and fever started trending upwards and this morning is 101 so wife was concerned and brought him to the emergency room for evaluation.  Patient states that since his stent removal he has had no burning or discomfort where he has had urinary frequency and urgency.  Initially he started having some hematuria but it started clearing up.  Denied any nausea, vomiting diarrhea.  Denies any cough or shortness of breath.  States that he did have some flank  pain yesterday since his procedure and took some Tylenol for fever.  Patient's family called urology office this morning who recommended patient come to the emergency room for evaluation.  Initially patient was not septic appearing however sepsis protocol was called when patient's blood pressure dropped.  He has bolused 2.5 L and [facility] was called to admit this patient for sepsis secondary likely complicated urinary tract infection postoperatively.  Upon admission, sepsis protocol was initiated including IV fluids 2.5 L boluses.  Also started on IV cefepime. Urologist was consulted. ------------------------------------------------------------------------------------------------------------------------------------------------------------     Active Problems:   Insulin dependent diabetes mellitus ([place])   Morbid obesity ([place])   Sleep apnea   Arteriosclerotic cardiovascular disease (ASCVD)   Hypertension   Hyperlipidemia   Atrial flutter (HCC)   Multiple myeloma in remission (HCC)   CKD (chronic kidney disease) stage 3, GFR 30-59 ml/min (HCC)   Chronic systolic CHF (congestive heart failure) ([place])   Sepsis secondary to UTI [facility])   Assessment & Plan:   Sepsis 2/2 to Complicated UTI -Patient met the initial criteria for sepsis.  Per protocol initiated on broad-spectrum antibiotics of cefepime, IV fluid resuscitation -Source of infection likely UTI status post instrumentation stent removal on 12/16/2017 -Symptomatic afebrile, normotensive -Continue with maintenance IV fluids, IV antibiotics, will follow with cultures blood and urine  -Continue with IV fluid maintenance hydration with rate of 75 mL/hr x1 day -Improved repeated procalcitonin and lactic acid   Dr. Junious Silk of urology recommends obtaining a renal ultrasound to rule out hydronephrosis -  Renal ultrasound -which was essentially unremarkable, negative for any hydronephrosis -Dr. Clydene Laming has seen and eval the patient recommended no  further intervention continue supportive therapy, antibiotics IV fluid  AKI on CKD stage 3 -Upon further review baseline appears to be 1.5-1.8 -Patient presented with a BUN/creatinine 39/2.11 -Continue with IV fluid hydration as above -Avoid nephrotoxic medications if possible -Continue with Calcitriol 0.25 mg p.o. every Monday -Monitoring BUN/creatinine  Mildly Elevated Troponin -Patient denies having any chest pain, no EKG changes, likely troponin leak, -Continue to monitor closely, supportive therapy, treating underlying cause -Likely demand ischemia or in the setting of acute on chronic kidney disease, sepsis  Insulin-Dependent Diabetes Mellitus Type 2 -Continue with home insulin detemir 10 mg subcu every morning and hold home sliding scale -Placed on Moderate NovoLog sliding scale insulin before meals and at bedtime -Last hemoglobin A1c on 11/10/2017 was 6.3 -Continue to monitor CBGs and adjust insulin regimen accordingly  A Flutter/Atrial Fibrillation -EKG shows patient has atrial fibrillation -Continue with anticoagulation with rivaroxaban -Not on any beta blocking agents -C/w Telemetry   Chronic Diarrhea/IBS -Continue with Eluxadoline 100 mg po BID  Hx of Multiple Myeloma -Currently in Remission  Essential HTN  -Currently not on any Antihypertensives per Medication review -Contunue to Monitor Blood Pressure  Gout -Continue with allopurinol 300 mg p.o. daily and may need to be renally adjusted if creatinine still continues to climb  GERD -Continue with Dexilant substitution with Protonix 40 mg p.o. Daily  Hyperlipidemia -Continue with atorvastatin  Arteriosclerotic Cardiovascular Disease/CAD and History of MI -Status post stent to LAD and diagonal in 2001 -Continue with aspirin 81 mg, Atorvastatin 80 mg po qHS, NTG 0.4 mg SL q74mn PRN Chest Pain  Chronic Combined Systolic and Diastolic CHF -Currently not Decompensated -Hold Home Torsemide 20 mg po  BID currently -Strict I's and O's, Daily Weights -Continue to Monitor Volume Status extremely carefully now that Sepsis Protocol has been initiated and patient volume resuscitated   OSA -C/w CPAP  Small pressure ulcer on the sole of the left foot metatarsal head  -continue to monitor, dressing accordingly, on antibiotics   DVT prophylaxis: Anticoagulated with Rivaroxaban  Code Status: FULL CODE Family Communication: Discussed with wife at bedside  Disposition Plan: Anticipate D/C Home in the next 48 hours if medically stable Consults called: Urology Dr. MFestus AloeAdmission status: Inpatient Telemetry     Antimicrobials:  Anti-infectives (From admission, onward)   Start     Dose/Rate Route Frequency Ordered Stop   12/18/17 1000  acyclovir (ZOVIRAX) tablet 400 mg     400 mg Oral  Every morning - 10a 12/17/17 1929     12/18/17 0400  ceFEPIme (MAXIPIME) 2 g in sodium chloride 0.9 % 100 mL IVPB     2 g 200 mL/hr over 30 Minutes Intravenous Every 12 hours 12/18/17 0355     12/17/17 1500  cefTRIAXone (ROCEPHIN) 2 g in sodium chloride 0.9 % 100 mL IVPB  Status:  Discontinued     2 g 200 mL/hr over 30 Minutes Intravenous Every 24 hours 12/17/17 1445 12/17/17 1447   12/17/17 1500  ceFEPIme (MAXIPIME) 2 g in sodium chloride 0.9 % 100 mL IVPB     2 g 200 mL/hr over 30 Minutes Intravenous  Once 12/17/17 1448 12/17/17 1619      Objective: Vitals:   12/17/17 1946 12/17/17 2328 12/18/17 0456 12/18/17 0500  BP: (!) 93/47  137/88   Pulse: (!) 116 (!) 102 82   Resp: 20 18 20  Temp: 98.7 F (37.1 C)  98.7 F (37.1 C)   TempSrc: Oral  Oral   SpO2: 100% 100% 98%   Weight: (!) 157.9 kg (348 lb)   (!) 157.9 kg (348 lb)  Height: _0  (1.88 m)       Intake/Output Summary (Last 24 hours) at 12/18/2017 1215 Last data filed at 12/18/2017 1540 Gross per 24 hour  Intake 1185.42 ml  Output 600 ml  Net 585.42 ml   Filed Weights   12/17/17 1231 12/17/17 1946 12/18/17 0500    Weight: (!) 156.9 kg (346 lb) (!) 157.9 kg (348 lb) (!) 157.9 kg (348 lb)    Examination:  General exam: Appears calm and comfortable  Psychiatry: Judgement and insight appear normal. Mood & affect appropriate. HEENT: WNLs Respiratory system: Clear to auscultation. Respiratory effort normal. Cardiovascular system: S1 & S2 heard, RRR. No JVD, murmurs, rubs, gallops or clicks. No pedal edema. Gastrointestinal system: Abd. nondistended, soft and nontender. No organomegaly or masses felt. Normal bowel sounds heard. Central nervous system: Alert and oriented. No focal neurological deficits. Extremities: Symmetric 5 x 5 power. Skin: No rashes, Small pressure ulcer on the sole of the left foot metatarsal head   Data Reviewed: I have personally reviewed following labs and imaging studies  CBC: Recent Labs  Lab 12/17/17 1323 12/18/17 0627  WBC 10.3 7.7  NEUTROABS 7.0 4.5  HGB 10.5* 8.9*  HCT 33.2* 28.5*  MCV 96.2 96.9  PLT 185 086   Basic Metabolic Panel: Recent Labs  Lab 12/17/17 1323 12/18/17 0627  NA 141 140  K 4.3 3.9  CL 110 111  CO2 24 20*  GLUCOSE 129* 128*  BUN 39* 36*  CREATININE 2.11* 1.86*  CALCIUM 8.6* 8.0*  MG  --  1.8  PHOS  --  3.2   GFR: Estimated Creatinine Clearance: 68.9 mL/min (A) (by C-G formula based on SCr of 1.86 mg/dL (H)). Liver Function Tests: Recent Labs  Lab 12/17/17 1323 12/18/17 0627  AST 41 23  ALT 47* 29  ALKPHOS 115 79  BILITOT 0.6 0.3  PROT 7.1 5.5*  ALBUMIN 3.3* 2.6*   No results for input(s): LIPASE, AMYLASE in the last 168 hours. No results for input(s): AMMONIA in the last 168 hours. Coagulation Profile: Recent Labs  Lab 12/17/17 1323 12/17/17 2004  INR 2.68 2.37   Cardiac Enzymes: Recent Labs  Lab 12/17/17 1323 12/17/17 1830 12/17/17 2211 12/18/17 0627  TROPONINI 0.10* 0.07* 0.12* 0.17*   BNP (last 3 results) No results for input(s): PROBNP in the last 8760 hours. HbA1C: No results for input(s): HGBA1C in  the last 72 hours. CBG: Recent Labs  Lab 12/16/17 1108 12/16/17 1315 12/17/17 2139 12/18/17 0814  GLUCAP 121* 111* 110* 106*   Lipid Profile: No results for input(s): CHOL, HDL, LDLCALC, TRIG, CHOLHDL, LDLDIRECT in the last 72 hours. Thyroid Function Tests: No results for input(s): TSH, T4TOTAL, FREET4, T3FREE, THYROIDAB in the last 72 hours. Anemia Panel: No results for input(s): VITAMINB12, FOLATE, FERRITIN, TIBC, IRON, RETICCTPCT in the last 72 hours. Sepsis Labs: Recent Labs  Lab 12/17/17 1338 12/17/17 2004 12/17/17 2211  PROCALCITON  --  0.37  --   LATICACIDVEN 1.46 1.0 1.3    Recent Results (from the past 240 hour(s))  Culture, blood (Routine x 2)     Status: None (Preliminary result)   Collection Time: 12/17/17  1:23 PM  Result Value Ref Range Status   Specimen Description   Final    BLOOD RIGHT ANTECUBITAL  Performed at [facility], [place] [address], [place], [place] [address]    Special Requests   Final    BOTTLES DRAWN AEROBIC AND ANAEROBIC Blood Culture adequate volume Performed at [place] [address], [place], [place] [address]    Culture   Final    NO GROWTH < 24 HOURS Performed at [place] [address], [place], [place] [address]    Report Status PENDING  Incomplete  Culture, blood (Routine x 2)     Status: None (Preliminary result)   Collection Time: 12/17/17  1:23 PM  Result Value Ref Range Status   Specimen Description   Final    BLOOD LEFT ANTECUBITAL Performed at [place] [address], [place], [place] [address]    Special Requests   Final    BOTTLES DRAWN AEROBIC AND ANAEROBIC Blood Culture results may not be optimal due to an excessive volume of blood received in culture bottles Performed at [place] [address] [place], [place] [address]    Culture   Final    NO GROWTH < 24 HOURS Performed at [place] [address] [place], [place] [address]    Report Status PENDING  Incomplete  Urine culture     Status: None   Collection Time: 12/17/17  1:23 PM  Result Value Ref Range Status   Specimen Description   Final    URINE, CLEAN CATCH Performed at [facility], [place] [address], [place], [place] [address]    Special Requests   Final    NONE Performed at [facility], [place] [address] [place], [place] [address]    Culture   Final    NO GROWTH Performed at [facility], [place] [address], [place], [place] [address]    Report Status 12/18/2017 FINAL  Final  MRSA PCR Screening     Status: Abnormal   Collection Time: 12/17/17 10:35 PM  Result Value Ref Range Status   MRSA by PCR POSITIVE (A) NEGATIVE Final    Comment:        The GeneXpert MRSA Assay (FDA approved for NASAL specimens only), is one component of a comprehensive MRSA colonization surveillance program. It is not intended to diagnose MRSA infection nor to guide or monitor treatment for MRSA infections. RESULT CALLED TO, READ BACK BY AND VERIFIED WITH: T RYAN,RN _0  12/18/17 MKELLY Performed at [facility], [place] [address] [place],  [address]       Radiology Studies: Dg Chest 2 View  Result Date: 12/17/2017 CLINICAL DATA:  Sepsis.  Fever. EXAM: CHEST - 2 VIEW COMPARISON:  11/09/2017 FINDINGS: Cardiomegaly. Left diaphragmatic hernia containing large bowel again noted, similar to prior study. Adjacent left base opacity likely reflects atelectasis. Mild vascular congestion. No overt edema. IMPRESSION: Cardiomegaly.  Vascular congestion. Left diaphragmatic hernia containing large bowel. Adjacent left base atelectasis. Electronically Signed   By: Rolm Baptise M.D.   On: 12/17/2017 14:11   US Renal  Result Date: 12/17/2017 CLINICAL DATA:  Acute kidney injury EXAM: RENAL / URINARY TRACT ULTRASOUND COMPLETE COMPARISON:  CT 11/10/2016 FINDINGS: Right Kidney: Length: 14.2 cm.  Echogenicity within normal limits. No mass or hydronephrosis visualized. Left Kidney: Length: 13.9 cm. Echogenicity within normal limits. No mass or hydronephrosis visualized. Bladder: Appears normal for degree of bladder distention. IMPRESSION: Unremarkable renal ultrasound.  No hydronephrosis. Electronically Signed   By: Rolm Baptise M.D.  On: 12/17/2017 17:47   Dg C-arm 1-60 Min-no Report  Result Date: 12/16/2017 Fluoroscopy was utilized by the requesting physician.  No radiographic interpretation.    Scheduled Meds: . acyclovir  400 mg Oral q morning - 10a  . allopurinol  300 mg Oral Daily  . aspirin EC  81 mg Oral Daily  . atorvastatin  80 mg Oral QHS  . [START ON 12/21/2017] calcitRIOL  0.25 mcg Oral Q Mon  . Chlorhexidine Gluconate Cloth  6 each Topical Q0600  . dicyclomine  20 mg Oral TID AC  . Eluxadoline  100 mg Oral BID WC  . gabapentin  300 mg Oral BID  . insulin aspart  0-15 Units Subcutaneous TID WC  . insulin aspart  0-5 Units Subcutaneous QHS  . insulin detemir  10 Units Subcutaneous q morning - 10a  . loratadine  10 mg Oral q morning - 10a  . multivitamin with minerals  1 tablet Oral Daily  . mupirocin ointment  1 application Nasal BID  . pantoprazole  40 mg Oral Daily  . rivaroxaban  20 mg Oral Daily   Continuous Infusions: . sodium chloride 75 mL/hr at 12/17/17 2137  . ceFEPime (MAXIPIME) IV 2 g (12/18/17 0501)    Time spent: >25 minutes  Deatra James, MD [facility],  Pager [phone]  If 7PM-7AM, please contact night-coverage [email or web address]   Password [facility]  12/18/2017, 12:15 PM

## 2017-12-18 NOTE — Consult Note (Signed)
[facility] Nurse wound consult note Patient evaluated in [place].  No family present. Reason for Consult: Left foot first metatarsal head, plantar surface wound Wound type: Neuropathic POA: Yes There is no open wound at the time of my assessment.  There is no induration, erythema, drainage, or odor.   Plan of care:  Apply betadine to the callus on the plantar surface of the left foot below the great toe.  Allow the betadine to dry.  Maintain a small foam dressing over the area.  The foam dressing can remain in place for 3 days and peeled back to apply the betadine.  Change the foam dressing every 3 days. Monitor the area for worsening of condition such as: Signs/symptoms of infection,  Increase in size,  Development of or worsening of odor, Development of pain, or increased pain at the affected locations.  Notify the medical team if any of these develop.  Thank you for the consult.  Discussed plan of care with the patient and bedside nurse.  [place] nurse will not follow at this time.  Please re-consult the [place] team if needed.  Val Riles, RN, MSN, CWOCN, CNS-BC, pager [phone]

## 2017-12-19 DIAGNOSIS — T8144XA Sepsis following a procedure, initial encounter: Principal | ICD-10-CM

## 2017-12-19 LAB — GLUCOSE, CAPILLARY
Glucose-Capillary: 82 mg/dL (ref 70–99)
Glucose-Capillary: 130 mg/dL — ABNORMAL HIGH (ref 70–99)
Glucose-Capillary: 149 mg/dL — ABNORMAL HIGH (ref 70–99)
Glucose-Capillary: 113 mg/dL — ABNORMAL HIGH (ref 70–99)

## 2017-12-19 LAB — BASIC METABOLIC PANEL
Anion gap: 7 (ref 5–15)
BUN: 32 mg/dL — ABNORMAL HIGH (ref 6–20)
CO2: 20 mmol/L — ABNORMAL LOW (ref 22–32)
Calcium: 7.9 mg/dL — ABNORMAL LOW (ref 8.9–10.3)
Chloride: 115 mmol/L — ABNORMAL HIGH (ref 98–111)
Creatinine, Ser: 1.74 mg/dL — ABNORMAL HIGH (ref 0.61–1.24)
GFR calc Af Amer: 48 mL/min — ABNORMAL LOW (ref >60)
GFR calc non Af Amer: 41 mL/min — ABNORMAL LOW (ref >60)
Glucose, Bld: 95 mg/dL (ref 70–99)
Potassium: 3.8 mmol/L (ref 3.5–5.1)
Sodium: 142 mmol/L (ref 135–145)

## 2017-12-19 LAB — CBC
HCT: 27.6 % — ABNORMAL LOW (ref 39.0–52.0)
Hemoglobin: 8.7 g/dL — ABNORMAL LOW (ref 13.0–17.0)
MCH: 30.5 pg (ref 26.0–34.0)
MCHC: 31.5 g/dL (ref 30.0–36.0)
MCV: 96.8 fL (ref 78.0–100.0)
Platelets: 149 10*3/uL — ABNORMAL LOW (ref 150–400)
RBC: 2.85 MIL/uL — ABNORMAL LOW (ref 4.22–5.81)
RDW: 16.5 % — ABNORMAL HIGH (ref 11.5–15.5)
WBC: 7.1 10*3/uL (ref 4.0–10.5)

## 2017-12-19 LAB — TROPONIN I: Troponin I: 0.04 ng/mL (ref <0.03)

## 2017-12-19 MED ORDER — SODIUM CHLORIDE 0.9 % IV SOLN
2.0000 g | Freq: Three times a day (TID) | INTRAVENOUS | Status: DC
  Administered 2017-12-19 – 2017-12-20 (×3): 2 g via INTRAVENOUS
  Filled 2017-12-19 (×4): qty 2

## 2017-12-19 NOTE — Progress Notes (Addendum)
PROGRESS NOTE  [name] CWU:[id number] DOB: [birth year] DOA: 12/17/2017 PCP: [name], MD  HPI/Recap of past 24 hours: [name] a 57 y.o.malewith medical history significant ofinsulin-dependent diabetes mellitus type 2, chronic kidney disease stage III, chronic diarrhea in the setting of irritable bowel syndrome, history of atrial flutter, history of CAD with stents and history of MI, history of combined chronic systolic and diastolic heart failure, history of multiple myeloma currently in remission, history of morbid obesity, obstructive sleep apnea on CPAP, gout, hypertension, hyperlipidemia, GERD, history of Fournier's gangrene and scrotal abscess and other comorbidities who presented to [facility] emergency room with a chief complaint of fever postoperatively after his stent removal for nephrolithiasis. Patient was in [place] yesterday and underwent right ureteroscopy and removal of stent by Dr. Alyson Ingles. Patient went home and fever started trending upwards and this morning is 101 so wife was concerned and brought him to the emergency room for evaluation. Admitted for urosepsis. Tmax 102.9 on 12/17/17 with respiration rate of 34.  Urine culture and blood cultures x2- to date.  12/19/2017: Patient seen and examined at his bedside.  He has no new complaints.  Denies dysuria or nausea.  Updated his wife on the phone.  RN reports 6 beats of nonsustained V. tach.  Patient asymptomatic at that time.  Obtain twelve-lead EKG.    Assessment/Plan: Active Problems:   Insulin dependent diabetes mellitus (HCC)   Morbid obesity (HCC)   Sleep apnea   Arteriosclerotic cardiovascular disease (ASCVD)   Hypertension   Hyperlipidemia   Atrial flutter (HCC)   Multiple myeloma in remission (HCC)   CKD (chronic kidney disease) stage 3, GFR 30-59 ml/min (HCC)   Chronic systolic CHF (congestive heart failure) (HCC)   Sepsis secondary to UTI (HCC)  Sepsis secondary to  complicated UTI, improving Afebrile with no leukocytosis Urine culture and blood cultures x2- to date Continue IV antibiotics on cefepime Avoid IV fluids due to low LVEF 30% Monitor fever curve Monitor WBC  QTC prolongation Independently reviewed 12 lead EKG done on 12/19/17 which revealed QTC 616 Avoid QTC prolonging agents C/ to monitor on telemetry  AKI on CKD 3 Improving Baseline creatinine 1.6 with GFR 52 Creatinine on presentation 2.11 Creatinine today 1.74 Continue to avoid nephrotoxic agents Repeat BMP in the morning  Elevated troponin, resolved Denies chest pain Possibly demand ischemia Peaked at 0.17 on 12/18/17 Obtain 1 set troponin and 12 leads EKG Troponin trended down to 0.04 from 0.17  Asymptomatic nonsustained V. tach 6 beats reported by RN Obtain twelve-lead EKG-Abnormal junctional rythm w rate 60 w prolonged QTC 616 Repeat 12 leads EKG in the am  Chronic A. fib Continue Xarelto for CVA prophylaxis Follows with cardiology outpatient Not on any rate control medications  Combined diastolic and systolic CHF Last 2D echo done on 10/04/2017 revealed LVEF 30 to 35% with diffuse hypokinesis and grade 2 diastolic dysfunction Continue cardiac medications Continue strict I's and O's Continue daily weight  History of bacteremia Continue to monitor blood cultures  Morbid obesity BMI 46 Recommend weight loss outpatient with regular physical activity and healthy dieting  Type 2 diabetes Last A1c 6.3 on 11/10/2017 Continue insulin sliding scale  OSA Continue CPAP at night   Code Status: Full code  Family Communication: Spoke with wife on the phone on 12/19/2017  Disposition Plan: Home in 1 to 2 days when clinically stable.   Consultants:  Urology  Procedures:  None  Antimicrobials:  Cefepime  DVT prophylaxis: xarelto  Objective: Vitals:   12/19/17 0500 12/19/17 0510 12/19/17 0830 12/19/17 0900  BP:  112/67    Pulse:  70    Resp:   (!) 24    Temp:  99.2 F (37.3 C) 98.3 F (36.8 C) 99.2 F (37.3 C)  TempSrc:  Oral Axillary Oral  SpO2:  100%    Weight: (!) 164.5 kg (362 lb 10.5 oz)     Height:        Intake/Output Summary (Last 24 hours) at 12/19/2017 1159 Last data filed at 12/19/2017 1117 Gross per 24 hour  Intake 960 ml  Output 900 ml  Net 60 ml   Filed Weights   12/17/17 1946 12/18/17 0500 12/19/17 0500  Weight: (!) 157.9 kg (348 lb) (!) 157.9 kg (348 lb) (!) 164.5 kg (362 lb 10.5 oz)    Exam:  . General: 57 y.o. year-old male morbidly obese. Alert and oriented x3. . Cardiovascular: Regular rate and rhythm with no rubs or gallops.  No thyromegaly or JVD noted.  Trace lower extremity edema. 2/4 pulses in all 4 extremities. Marland Kitchen Respiratory: Clear to auscultation with no wheezes or rales. Good inspiratory effort. . Abdomen: Soft nontender nondistended with normal bowel sounds x4 quadrants. . Psychiatry: Mood is appropriate for condition and setting   Data Reviewed: CBC: Recent Labs  Lab 12/17/17 1323 12/18/17 0627 12/19/17 0535  WBC 10.3 7.7 7.1  NEUTROABS 7.0 4.5  --   HGB 10.5* 8.9* 8.7*  HCT 33.2* 28.5* 27.6*  MCV 96.2 96.9 96.8  PLT 185 155 502*   Basic Metabolic Panel: Recent Labs  Lab 12/17/17 1323 12/18/17 0627 12/19/17 0535  NA 141 140 142  K 4.3 3.9 3.8  CL 110 111 115*  CO2 24 20* 20*  GLUCOSE 129* 128* 95  BUN 39* 36* 32*  CREATININE 2.11* 1.86* 1.74*  CALCIUM 8.6* 8.0* 7.9*  MG  --  1.8  --   PHOS  --  3.2  --    GFR: Estimated Creatinine Clearance: 75.3 mL/min (A) (by C-G formula based on SCr of 1.74 mg/dL (H)). Liver Function Tests: Recent Labs  Lab 12/17/17 1323 12/18/17 0627  AST 41 23  ALT 47* 29  ALKPHOS 115 79  BILITOT 0.6 0.3  PROT 7.1 5.5*  ALBUMIN 3.3* 2.6*   No results for input(s): LIPASE, AMYLASE in the last 168 hours. No results for input(s): AMMONIA in the last 168 hours. Coagulation Profile: Recent Labs  Lab 12/17/17 1323 12/17/17 2004    INR 2.68 2.37   Cardiac Enzymes: Recent Labs  Lab 12/17/17 1323 12/17/17 1830 12/17/17 2211 12/18/17 0627  TROPONINI 0.10* 0.07* 0.12* 0.17*   BNP (last 3 results) No results for input(s): PROBNP in the last 8760 hours. HbA1C: No results for input(s): HGBA1C in the last 72 hours. CBG: Recent Labs  Lab 12/18/17 0814 12/18/17 1229 12/18/17 1702 12/18/17 2134 12/19/17 0750  GLUCAP 106* 159* 105* 165* 82   Lipid Profile: No results for input(s): CHOL, HDL, LDLCALC, TRIG, CHOLHDL, LDLDIRECT in the last 72 hours. Thyroid Function Tests: No results for input(s): TSH, T4TOTAL, FREET4, T3FREE, THYROIDAB in the last 72 hours. Anemia Panel: No results for input(s): VITAMINB12, FOLATE, FERRITIN, TIBC, IRON, RETICCTPCT in the last 72 hours. Urine analysis:    Component Value Date/Time   COLORURINE YELLOW 12/17/2017 1323   APPEARANCEUR CLOUDY (A) 12/17/2017 1323   LABSPEC 1.009 12/17/2017 1323   PHURINE 5.0 12/17/2017 1323   GLUCOSEU NEGATIVE 12/17/2017 1323   HGBUR LARGE (A)  12/17/2017 1323   [place] 12/17/2017 1323   KETONESUR NEGATIVE 12/17/2017 1323   PROTEINUR 100 (A) 12/17/2017 1323   UROBILINOGEN 0.2 10/03/2012 1809   NITRITE NEGATIVE 12/17/2017 1323   LEUKOCYTESUR LARGE (A) 12/17/2017 1323   Sepsis Labs: _0 (procalcitonin:4,lacticidven:4)  ) Recent Results (from the past 240 hour(s))  Culture, blood (Routine x 2)     Status: None (Preliminary result)   Collection Time: 12/17/17  1:23 PM  Result Value Ref Range Status   Specimen Description   Final    BLOOD RIGHT ANTECUBITAL Performed at [facility], [place] [address] [place], [place] [address]    Special Requests   Final    BOTTLES DRAWN AEROBIC AND ANAEROBIC Blood Culture adequate volume Performed at [facility] [address] [place], [place] [address]    Culture   Final    NO GROWTH < 24 HOURS Performed at [facility] [address], [place], [place] [address]    Report Status PENDING  Incomplete  Culture, blood (Routine x 2)     Status: None (Preliminary result)   Collection Time: 12/17/17  1:23 PM  Result Value Ref Range Status   Specimen Description   Final    BLOOD LEFT ANTECUBITAL Performed at [facility] [address] [place] [employer], [place] [address]    Special Requests   Final    BOTTLES DRAWN AEROBIC AND ANAEROBIC Blood Culture results may not be optimal due to an excessive volume of blood received in culture bottles Performed at [facility] [address] [place], [place] [address]    Culture   Final    NO GROWTH < 24 HOURS Performed at [facility] [address], [place], [place] [address]    Report Status PENDING  Incomplete  Urine culture     Status: None   Collection Time: 12/17/17  1:23 PM  Result Value Ref Range Status   Specimen Description   Final    URINE, CLEAN CATCH Performed at [facility], [place] [address], [place], [place] [address]    Special Requests   Final    NONE Performed at [facility], [place] [address], [place], [place] [address]    Culture   Final    NO GROWTH Performed at [facility], [place] [address] [place], [place] [address]    Report Status 12/18/2017 FINAL  Final  MRSA PCR Screening     Status: Abnormal   Collection Time: 12/17/17 10:35 PM  Result Value Ref Range Status   MRSA by PCR POSITIVE (A) NEGATIVE Final    Comment:        The GeneXpert MRSA Assay (FDA approved for NASAL specimens only), is one component of a comprehensive MRSA colonization surveillance program. It is not intended to diagnose MRSA infection nor to guide or monitor treatment for MRSA infections. RESULT CALLED TO, READ BACK BY AND VERIFIED WITH: T RYAN,RN _1  12/18/17 MKELLY Performed at [facility], [place] [address], [place],  [address]       Studies: No results  found.  Scheduled Meds: . acyclovir  400 mg Oral q morning - 10a  . allopurinol  300 mg Oral Daily  . aspirin EC  81 mg Oral Daily  . atorvastatin  80 mg Oral QHS  . [START ON 12/21/2017] calcitRIOL  0.25 mcg Oral Q Mon  . Chlorhexidine Gluconate Cloth  6 each Topical  Q0600  . dicyclomine  20 mg Oral TID AC  . Eluxadoline  100 mg Oral BID WC  . gabapentin  300 mg Oral BID  . insulin aspart  0-15 Units Subcutaneous TID WC  . insulin aspart  0-5 Units Subcutaneous QHS  . insulin detemir  10 Units Subcutaneous q morning - 10a  . loratadine  10 mg Oral q morning - 10a  . multivitamin with minerals  1 tablet Oral Daily  . mupirocin ointment  1 application Nasal BID  . pantoprazole  40 mg Oral Daily  . rivaroxaban  20 mg Oral Daily    Continuous Infusions: . ceFEPime (MAXIPIME) IV       LOS: 2 days     Kayleen Memos, MD [facility] Pager [phone]  If 7PM-7AM, please contact night-coverage [email or web address] Password [email or web address] 12/19/2017, 11:59 AM

## 2017-12-19 NOTE — Progress Notes (Signed)
Pharmacy Antibiotic Note  [name] is a 57 y.o. male admitted on 12/17/2017 with sepsis d/t UTI.  Pharmacy has been consulted for Cefepime dosing.  Plan: SCr decreased, adjust Cefepime Cefepime 2 g IV q12 hr to q8hr for improved renal function (using higher dose d/t weight)   Height: 6\' 2"  (188 cm) Weight: (!) 362 lb 10.5 oz (164.5 kg) IBW/kg (Calculated) : 82.2  Temp (24hrs), Avg:99 F (37.2 C), Min:98.3 F (36.8 C), Max:99.3 F (37.4 C)  Recent Labs  Lab 12/17/17 1323 12/17/17 1338 12/17/17 2004 12/17/17 2211 12/18/17 0627 12/19/17 0535  WBC 10.3  --   --   --  7.7 7.1  CREATININE 2.11*  --   --   --  1.86* 1.74*  LATICACIDVEN  --  1.46 1.0 1.3  --   --     Estimated Creatinine Clearance: 75.3 mL/min (A) (by C-G formula based on SCr of 1.74 mg/dL (H)).    Allergies  Allergen Reactions  . Beef-Derived Products Diarrhea  . Pork-Derived Products Diarrhea    Antimicrobials this admission: 7/18 Cefepime >>   Dose adjustments this admission: 7/20: increased Cefepime 2gm q12 > q8hr for impr renal fx  Microbiology results: 7/18 BCx: ngtd 7/18 UCx: ng-final  7/18 MRSA PCR: positive  Thank you for allowing pharmacy to be a part of this patient's care.  Minda Ditto PharmD Pager [phone] 12/19/2017, 10:49 AM

## 2017-12-20 LAB — GLUCOSE, CAPILLARY
Glucose-Capillary: 123 mg/dL — ABNORMAL HIGH (ref 70–99)
Glucose-Capillary: 275 mg/dL — ABNORMAL HIGH (ref 70–99)
Glucose-Capillary: 103 mg/dL — ABNORMAL HIGH (ref 70–99)
Glucose-Capillary: 113 mg/dL — ABNORMAL HIGH (ref 70–99)

## 2017-12-20 LAB — BASIC METABOLIC PANEL
Anion gap: 7 (ref 5–15)
BUN: 31 mg/dL — ABNORMAL HIGH (ref 6–20)
CO2: 19 mmol/L — ABNORMAL LOW (ref 22–32)
Calcium: 7.7 mg/dL — ABNORMAL LOW (ref 8.9–10.3)
Chloride: 114 mmol/L — ABNORMAL HIGH (ref 98–111)
Creatinine, Ser: 1.59 mg/dL — ABNORMAL HIGH (ref 0.61–1.24)
GFR calc Af Amer: 54 mL/min — ABNORMAL LOW (ref >60)
GFR calc non Af Amer: 46 mL/min — ABNORMAL LOW (ref >60)
Glucose, Bld: 130 mg/dL — ABNORMAL HIGH (ref 70–99)
Potassium: 3.7 mmol/L (ref 3.5–5.1)
Sodium: 140 mmol/L (ref 135–145)

## 2017-12-20 LAB — CBC
HCT: 27.6 % — ABNORMAL LOW (ref 39.0–52.0)
Hemoglobin: 8.8 g/dL — ABNORMAL LOW (ref 13.0–17.0)
MCH: 30.4 pg (ref 26.0–34.0)
MCHC: 31.9 g/dL (ref 30.0–36.0)
MCV: 95.5 fL (ref 78.0–100.0)
Platelets: 161 10*3/uL (ref 150–400)
RBC: 2.89 MIL/uL — ABNORMAL LOW (ref 4.22–5.81)
RDW: 16.4 % — ABNORMAL HIGH (ref 11.5–15.5)
WBC: 7.3 10*3/uL (ref 4.0–10.5)

## 2017-12-20 MED ORDER — CEPHALEXIN 500 MG PO CAPS
500.0000 mg | ORAL_CAPSULE | Freq: Three times a day (TID) | ORAL | Status: DC
  Administered 2017-12-20 – 2017-12-21 (×4): 500 mg via ORAL
  Filled 2017-12-20 (×4): qty 1

## 2017-12-20 MED ORDER — CEPHALEXIN 500 MG PO CAPS: 500.0000 mg | ORAL_CAPSULE | Freq: Three times a day (TID) | ORAL | Status: DC

## 2017-12-20 NOTE — Progress Notes (Signed)
PROGRESS NOTE  [name] WFU:[id number] DOB: 15-Apr-1961 DOA: 12/17/2017 PCP: [name], MD  HPI/Recap of past 24 hours: [name] a 57 y.o.malewith medical history significant ofinsulin-dependent diabetes mellitus type 2, chronic kidney disease stage III, chronic diarrhea in the setting of irritable bowel syndrome, history of atrial flutter, history of CAD s/p PCI with stents and history of MI, history of combined chronic systolic and diastolic heart failure, history of multiple myeloma currently in remission, history of morbid obesity, obstructive sleep apnea on CPAP, gout, hypertension, hyperlipidemia, GERD, history of Fournier's gangrene and scrotal abscess and other comorbidities who presented to [facility] emergency room with a chief complaint of fever postoperatively after his ureter stent removal for nephrolithiasis. Patient was in [place] and underwent right ureteroscopy and removal of stent by Dr. Alyson Ingles. Patient went home and fever started trending upwards and this morning is 101 so wife was concerned and brought him to the emergency room for evaluation. Admitted for urosepsis. Tmax 102.9 on 12/17/17 with respiration rate of 34.  Urine culture and blood cultures x2- to date.  Hospital course complicated by asymptomatic runs of nonsustained V. tach and prolonged QTC on twelve-lead EKG.  12/20/2017: Patient seen and examined at his bedside.  He has no new complaints.  Blood cultures x2 and urine culture still no growth to date.  On IV cefepime.  Will switch to p.o. Keflex.    Assessment/Plan: Active Problems:   Insulin dependent diabetes mellitus (HCC)   Morbid obesity (HCC)   Sleep apnea   Arteriosclerotic cardiovascular disease (ASCVD)   Hypertension   Hyperlipidemia   Atrial flutter (HCC)   Multiple myeloma in remission (HCC)   CKD (chronic kidney disease) stage 3, GFR 30-59 ml/min (HCC)   Chronic systolic CHF (congestive heart failure) (HCC)  Sepsis secondary to UTI ([place])  Resolving sepsis secondary to complicated UTI Afebrile with no leukocytosis Urine culture and blood cultures x2- to date Completed 3 days of IV cefepime Switch to p.o. Keflex on 12/20/2017  QTC prolongation Independently reviewed 12 lead EKG done on 12/19/17 which revealed QTC 616 Avoid QTC prolonging agents Obtain twelve-lead EKG on 12/20/2017  Resolving AKI on CKD 3 Creatinine on 12/20/2017 is 1.59 which appears to be at his baseline Baseline creatinine 1.5 with GFR 54 Creatinine on presentation 2.11 Continue to avoid nephrotoxic agents Repeat BMP in the morning  Elevated troponin, resolved Denies chest pain Possibly demand ischemia Peaked at 0.17 on 12/18/17 Trended down to 0.04 on 12/19/2017  Asymptomatic nonsustained V. tach 6 beats reported by RN on 12/19/2017-Prolonged QTC on 12-lead Repeat 12 leads EKG on 12/20/2017  Chronic A. fib Continue Xarelto for CVA prophylaxis Follow up with cardiology outpatient Not on any rate control medications  Combined diastolic and systolic CHF Last 2D echo done on 10/04/2017 revealed LVEF 30 to 35% with diffuse hypokinesis and grade 2 diastolic dysfunction Continue cardiac medications Continue strict I's and O's Continue daily weight  History of bacteremia Continue to monitor blood cultures and urine culture  Morbid obesity BMI 46 Recommend weight loss outpatient with regular physical activity and healthy dieting  Type 2 diabetes Last A1c 6.3 on 11/10/2017 Continue insulin sliding scale  OSA Continue CPAP at night   Code Status: Full code  Family Communication: Spoke with wife on the phone on 12/19/2017  Disposition Plan: Home tomorrow 12/21/2017 once urine culture returns with sensitivities.  PT to assess for discharge planning.   Consultants:  Urology  Procedures:  None  Antimicrobials:  Keflex  DVT prophylaxis: xarelto    Objective: Vitals:   12/19/17 0900 12/19/17 1413  12/19/17 2130 12/20/17 0500  BP:  103/64 139/82 119/65  Pulse:  (!) 59 71 66  Resp:  _0 Temp: 99.2 F (37.3 C) 98.9 F (37.2 C) 99.1 F (37.3 C) 98.4 F (36.9 C)  TempSrc: Oral Oral Oral Oral  SpO2:  100% 100% 100%  Weight:    (!) 164.6 kg (362 lb 14 oz)  Height:        Intake/Output Summary (Last 24 hours) at 12/20/2017 1300 Last data filed at 12/19/2017 2017 Gross per 24 hour  Intake 480 ml  Output 650 ml  Net -170 ml   Filed Weights   12/18/17 0500 12/19/17 0500 12/20/17 0500  Weight: (!) 157.9 kg (348 lb) (!) 164.5 kg (362 lb 10.5 oz) (!) 164.6 kg (362 lb 14 oz)    Exam:  . General: 57 y.o. year-old male morbidly obese.  In no acute distress.  Alert oriented x3. . Cardiovascular: Regular rate and rhythm with no rubs or gallops.  No thyromegaly or JVD noted.  Trace edema in lower extremities bilaterally. Marland Kitchen Respiratory: Clear to auscultation with no wheezes or rales. Good inspiratory effort. . Abdomen: Soft obese nontender nondistended with normal bowel sounds x4 quadrants. . Psychiatry: Mood is appropriate for condition and setting   Data Reviewed: CBC: Recent Labs  Lab 12/17/17 1323 12/18/17 0627 12/19/17 0535 12/20/17 0555  WBC 10.3 7.7 7.1 7.3  NEUTROABS 7.0 4.5  --   --   HGB 10.5* 8.9* 8.7* 8.8*  HCT 33.2* 28.5* 27.6* 27.6*  MCV 96.2 96.9 96.8 95.5  PLT 185 155 149* 458   Basic Metabolic Panel: Recent Labs  Lab 12/17/17 1323 12/18/17 0627 12/19/17 0535 12/20/17 0555  NA 141 140 142 140  K 4.3 3.9 3.8 3.7  CL 110 111 115* 114*  CO2 24 20* 20* 19*  GLUCOSE 129* 128* 95 130*  BUN 39* 36* 32* 31*  CREATININE 2.11* 1.86* 1.74* 1.59*  CALCIUM 8.6* 8.0* 7.9* 7.7*  MG  --  1.8  --   --   PHOS  --  3.2  --   --    GFR: Estimated Creatinine Clearance: 82.5 mL/min (A) (by C-G formula based on SCr of 1.59 mg/dL (H)). Liver Function Tests: Recent Labs  Lab 12/17/17 1323 12/18/17 0627  AST 41 23  ALT 47* 29  ALKPHOS 115 79  BILITOT 0.6 0.3   PROT 7.1 5.5*  ALBUMIN 3.3* 2.6*   No results for input(s): LIPASE, AMYLASE in the last 168 hours. No results for input(s): AMMONIA in the last 168 hours. Coagulation Profile: Recent Labs  Lab 12/17/17 1323 12/17/17 2004  INR 2.68 2.37   Cardiac Enzymes: Recent Labs  Lab 12/17/17 1323 12/17/17 1830 12/17/17 2211 12/18/17 0627 12/19/17 1311  TROPONINI 0.10* 0.07* 0.12* 0.17* 0.04*   BNP (last 3 results) No results for input(s): PROBNP in the last 8760 hours. HbA1C: No results for input(s): HGBA1C in the last 72 hours. CBG: Recent Labs  Lab 12/19/17 1200 12/19/17 1704 12/19/17 2128 12/20/17 0753 12/20/17 1159  GLUCAP 130* 149* 113* 123* 275*   Lipid Profile: No results for input(s): CHOL, HDL, LDLCALC, TRIG, CHOLHDL, LDLDIRECT in the last 72 hours. Thyroid Function Tests: No results for input(s): TSH, T4TOTAL, FREET4, T3FREE, THYROIDAB in the last 72 hours. Anemia Panel: No results for input(s): VITAMINB12, FOLATE, FERRITIN, TIBC, IRON, RETICCTPCT in the last 72 hours. Urine  analysis:    Component Value Date/Time   COLORURINE YELLOW 12/17/2017 1323   APPEARANCEUR CLOUDY (A) 12/17/2017 1323   LABSPEC 1.009 12/17/2017 1323   PHURINE 5.0 12/17/2017 1323   GLUCOSEU NEGATIVE 12/17/2017 1323   HGBUR LARGE (A) 12/17/2017 1323   BILIRUBINUR NEGATIVE 12/17/2017 1323   KETONESUR NEGATIVE 12/17/2017 1323   PROTEINUR 100 (A) 12/17/2017 1323   UROBILINOGEN 0.2 10/03/2012 1809   NITRITE NEGATIVE 12/17/2017 1323   LEUKOCYTESUR LARGE (A) 12/17/2017 1323   Sepsis Labs: _0 (procalcitonin:4,lacticidven:4)  ) Recent Results (from the past 240 hour(s))  Culture, blood (Routine x 2)     Status: None (Preliminary result)   Collection Time: 12/17/17  1:23 PM  Result Value Ref Range Status   Specimen Description   Final    BLOOD RIGHT ANTECUBITAL Performed at [facility], [place] [address] [place], [place] [address]    Special Requests   Final     BOTTLES DRAWN AEROBIC AND ANAEROBIC Blood Culture adequate volume Performed at [facility] [address], [place], [place] [address]    Culture   Final    NO GROWTH 2 DAYS Performed at [facility] [address], [place], [place] [address]    Report Status PENDING  Incomplete  Culture, blood (Routine x 2)     Status: None (Preliminary result)   Collection Time: 12/17/17  1:23 PM  Result Value Ref Range Status   Specimen Description   Final    BLOOD LEFT ANTECUBITAL Performed at [facility] [address] [place], [place] [address]    Special Requests   Final    BOTTLES DRAWN AEROBIC AND ANAEROBIC Blood Culture results may not be optimal due to an excessive volume of blood received in culture bottles Performed at [facility] [address], [place], [place] [address]    Culture   Final    NO GROWTH 2 DAYS Performed at [facility] [address], [place], [place] [address]    Report Status PENDING  Incomplete  Urine culture     Status: None   Collection Time: 12/17/17  1:23 PM  Result Value Ref Range Status   Specimen Description   Final    URINE, CLEAN CATCH Performed at [facility], [place] [address], [place], [place] [address]    Special Requests   Final    NONE Performed at [facility], [place] [address], [place],  [address]    Culture   Final    NO GROWTH Performed at [facility], [place] [address], [place],  [address]    Report Status 12/18/2017 FINAL  Final  MRSA PCR Screening     Status: Abnormal   Collection Time: 12/17/17 10:35 PM  Result Value Ref Range Status   MRSA by PCR POSITIVE (A) NEGATIVE Final    Comment:        The GeneXpert MRSA Assay (FDA approved for NASAL specimens only), is one component of a comprehensive MRSA colonization surveillance program. It is not intended to diagnose MRSA infection nor to guide  or monitor treatment for MRSA infections. RESULT CALLED TO, READ BACK BY AND VERIFIED WITH: T RYAN,RN _1  12/18/17 MKELLY Performed at [facility], [place] [address] [place],  [address]       Studies: No results found.  Scheduled Meds: . acyclovir  400 mg Oral q morning - 10a  . allopurinol  300  mg Oral Daily  . aspirin EC  81 mg Oral Daily  . atorvastatin  80 mg Oral QHS  . [START ON 12/21/2017] calcitRIOL  0.25 mcg Oral Q Mon  . cephALEXin  500 mg Oral Q8H  . Chlorhexidine Gluconate Cloth  6 each Topical Q0600  . dicyclomine  20 mg Oral TID AC  . Eluxadoline  100 mg Oral BID WC  . gabapentin  300 mg Oral BID  . insulin aspart  0-15 Units Subcutaneous TID WC  . insulin aspart  0-5 Units Subcutaneous QHS  . insulin detemir  10 Units Subcutaneous q morning - 10a  . loratadine  10 mg Oral q morning - 10a  . multivitamin with minerals  1 tablet Oral Daily  . mupirocin ointment  1 application Nasal BID  . pantoprazole  40 mg Oral Daily  . rivaroxaban  20 mg Oral Daily    Continuous Infusions:    LOS: 3 days     Kayleen Memos, MD [facility] Pager [phone]  If 7PM-7AM, please contact night-coverage [email or web address] Password [email or web address] 12/20/2017, 1:00 PM

## 2017-12-21 DIAGNOSIS — A419 Sepsis, unspecified organism: Secondary | ICD-10-CM

## 2017-12-21 DIAGNOSIS — N390 Urinary tract infection, site not specified: Secondary | ICD-10-CM

## 2017-12-21 LAB — CBC
HCT: 31.5 % — ABNORMAL LOW (ref 39.0–52.0)
Hemoglobin: 10 g/dL — ABNORMAL LOW (ref 13.0–17.0)
MCH: 30.4 pg (ref 26.0–34.0)
MCHC: 31.7 g/dL (ref 30.0–36.0)
MCV: 95.7 fL (ref 78.0–100.0)
Platelets: 183 10*3/uL (ref 150–400)
RBC: 3.29 MIL/uL — ABNORMAL LOW (ref 4.22–5.81)
RDW: 16.5 % — ABNORMAL HIGH (ref 11.5–15.5)
WBC: 5.7 10*3/uL (ref 4.0–10.5)

## 2017-12-21 LAB — BASIC METABOLIC PANEL
Anion gap: 6 (ref 5–15)
BUN: 25 mg/dL — ABNORMAL HIGH (ref 6–20)
CO2: 20 mmol/L — ABNORMAL LOW (ref 22–32)
Calcium: 8.4 mg/dL — ABNORMAL LOW (ref 8.9–10.3)
Chloride: 116 mmol/L — ABNORMAL HIGH (ref 98–111)
Creatinine, Ser: 1.49 mg/dL — ABNORMAL HIGH (ref 0.61–1.24)
GFR calc Af Amer: 58 mL/min — ABNORMAL LOW (ref >60)
GFR calc non Af Amer: 50 mL/min — ABNORMAL LOW (ref >60)
Glucose, Bld: 108 mg/dL — ABNORMAL HIGH (ref 70–99)
Potassium: 3.8 mmol/L (ref 3.5–5.1)
Sodium: 142 mmol/L (ref 135–145)

## 2017-12-21 LAB — GLUCOSE, CAPILLARY
Glucose-Capillary: 101 mg/dL — ABNORMAL HIGH (ref 70–99)
Glucose-Capillary: 151 mg/dL — ABNORMAL HIGH (ref 70–99)

## 2017-12-21 MED ORDER — CEPHALEXIN 500 MG PO CAPS: 500.0000 mg | ORAL_CAPSULE | Freq: Three times a day (TID) | ORAL | 0 refills | Status: AC

## 2017-12-21 NOTE — Progress Notes (Signed)
Patient being discharged to home today. Went over discharge instructions with patient and wife. Patient aware of prescription sent to pharmacy and his need to follow up. IV site was removed. Patient home medication returned to patient from pharmacy.All questions answered. Patient wheeled out by staff.

## 2017-12-21 NOTE — Evaluation (Signed)
Physical Therapy Evaluation Patient Details Name: [name] MRN: [id number] DOB: [birth year] Today's Date: 12/21/2017   History of Present Illness  57 y.o. male with medical history significant of insulin-dependent diabetes mellitus type 2, chronic kidney disease stage III, chronic diarrhea in the setting of irritable bowel syndrome, history of atrial flutter, history of CAD s/p PCI with stents and history of MI, history of combined chronic systolic and diastolic heart failure, history of multiple myeloma currently in remission, history of morbid obesity, obstructive sleep apnea on CPAP, gout, hypertension, hyperlipidemia, GERD, history of Fournier's gangrene and scrotal abscess and other comorbidities who presented to [facility] emergency room with a chief complaint of fever postoperatively after his ureter stent removal for nephrolithiasis.    Clinical Impression  Pt admitted with above diagnosis. Pt currently with functional limitations due to the deficits listed below (see PT Problem List). Pt ambulated 120' with RW, no loss of balance, distance limited by fatigue. Pt is generally deconditioned from multiple recent hospitalizations but is eager to mobilize and get stronger.  Pt will benefit from skilled PT to increase their independence and safety with mobility to allow discharge to the venue listed below.       Follow Up Recommendations Home health PT    Equipment Recommendations  None recommended by PT    Recommendations for Other Services       Precautions / Restrictions Precautions Precautions: Fall Precaution Comments: 5 falls in past 1 year (pt stated due to BP meds) Restrictions Weight Bearing Restrictions: No      Mobility  Bed Mobility Overal bed mobility: Needs Assistance Bed Mobility: Supine to Sit     Supine to sit: Mod assist     General bed mobility comments: assist to advance BLEs and to raise trunk  Transfers Overall transfer level: Needs  assistance Equipment used: Rolling walker (2 wheeled) Transfers: Sit to/from Stand Sit to Stand: From elevated surface;Min assist            Ambulation/Gait Ambulation/Gait assistance: Min guard Gait Distance (Feet): 120 Feet Assistive device: Rolling walker (2 wheeled) Gait Pattern/deviations: Step-through pattern;Wide base of support     General Gait Details: decr  Stairs            Wheelchair Mobility    Modified Rankin (Stroke Patients Only)       Balance Overall balance assessment: Modified Independent;History of Falls                                           Pertinent Vitals/Pain Pain Assessment: No/denies pain    Home Living Family/patient expects to be discharged to:: Private residence Living Arrangements: Spouse/significant other Available Help at Discharge: Family;Personal care attendant Type of Home: House Home Access: Stairs to enter Entrance Stairs-Rails: Right;Left;Can reach both Entrance Stairs-Number of Steps: 7  to enter on 3rd level, first 4 steps has bilateral siderails, last 3 has no rails Home Layout: Multi-level;Able to live on main level with bedroom/bathroom Home Equipment: Gilford Rile - 2 wheels;Wheelchair - manual;Hospital bed;Bedside commode;Cane - single point      Prior Function Level of Independence: Needs assistance   Gait / Transfers Assistance Needed: with [name]; household distances  ADL's / Homemaking Assistance Needed: assisted by family        Hand Dominance   Dominant Hand: Right    Extremity/Trunk Assessment   Upper Extremity Assessment Upper  Extremity Assessment: Overall WFL for tasks assessed    Lower Extremity Assessment Lower Extremity Assessment: Overall WFL for tasks assessed(+4/5 B knee extension, tops of B feet decreased sensation to light touch (chemo induced neuropathy))    Cervical / Trunk Assessment Cervical / Trunk Assessment: Normal  Communication   Communication: No  difficulties  Cognition Arousal/Alertness: Awake/alert Behavior During Therapy: WFL for tasks assessed/performed Overall Cognitive Status: Within Functional Limits for tasks assessed                                        General Comments      Exercises     Assessment/Plan    PT Assessment Patient needs continued PT services  PT Problem List Decreased activity tolerance;Decreased mobility;Obesity       PT Treatment Interventions DME instruction;Gait training;Therapeutic activities    PT Goals (Current goals can be found in the Care Plan section)  Acute Rehab PT Goals Patient Stated Goal: to wean down to a cane, then nothing; return to work as [name] PT Goal Formulation: With patient Time For Goal Achievement: 01/04/18 Potential to Achieve Goals: Good    Frequency Min 3X/week   Barriers to discharge        Co-evaluation               AM-PAC PT "6 Clicks" Daily Activity  Outcome Measure Difficulty turning over in bed (including adjusting bedclothes, sheets and blankets)?: Unable Difficulty moving from lying on back to sitting on the side of the bed? : Unable Difficulty sitting down on and standing up from a chair with arms (e.g., wheelchair, bedside commode, etc,.)?: A Lot Help needed moving to and from a bed to chair (including a wheelchair)?: A Little Help needed walking in hospital room?: A Little Help needed climbing 3-5 steps with a railing? : A Lot 6 Click Score: 12    End of Session Equipment Utilized During Treatment: Gait belt Activity Tolerance: Patient tolerated treatment well Patient left: in chair;with call bell/phone within reach Nurse Communication: Mobility status PT Visit Diagnosis: Difficulty in walking, not elsewhere classified (R26.2)    Time: 8546-2703 PT Time Calculation (min) (ACUTE ONLY): 32 min   Charges:   PT Evaluation $PT Eval Low Complexity: 1 Low PT Treatments $Gait Training: 8-22 mins    PT G Codes:          Philomena Doheny 12/21/2017, 11:11 AM [phone]

## 2017-12-21 NOTE — Discharge Summary (Signed)
Physician Discharge Summary  MALONE VANBLARCOM POE:[id number] DOB: 29-Aug-1960 DOA: 12/17/2017  PCP: [name], MD  Admit date: 12/17/2017 Discharge date: 12/21/2017  Admitted From: Home Disposition:  Home  Recommendations for Outpatient Follow-up:  1. Follow up with Dr. Alyson Ingles as scheduled on 7/24 2. Please follow up on the following pending results: final blood culture results, negative at day of discharge   Discharge Condition: Stable CODE STATUS: Full  Diet recommendation: Carb modified   Brief/Interim Summary: [name] a 57 y.o.malewith medical history significant ofinsulin-dependent diabetes mellitus type 2, chronic kidney disease stage III, chronic diarrhea in the setting of irritable bowel syndrome, history of atrial flutter, history of CAD s/p PCI with stents and history of MI, history of combined chronic systolic and diastolic heart failure, history of multiple myeloma currently in remission, history of morbid obesity, obstructive sleep apnea on CPAP, gout, hypertension, hyperlipidemia, GERD, history of Fournier's gangrene and scrotal abscess and other comorbidities who presented to [facility] emergency room with a chief complaint of fever postoperatively after his ureter stent removal for nephrolithiasis. Patient was in [place] and underwent right ureteroscopy and removal of stent by Dr. Alyson Ingles. Patient went home and fever started trending upwards and this morning is 101 so wife was concerned and brought him to the emergency room for evaluation. Admitted for urosepsis. Tmax 102.9 on 12/17/17 with respiration rate of 34. He was treated with IV antibiotics. Urine culture and blood cultures x2 have been negative to date. He continued to improve clinically and antibiotics were deescalated to keflex on discharge.   Discharge Diagnoses:  Active Problems:   Insulin dependent diabetes mellitus (HCC)   Morbid obesity (HCC)   Sleep apnea   Arteriosclerotic  cardiovascular disease (ASCVD)   Hypertension   Hyperlipidemia   Atrial flutter (HCC)   Pressure injury of skin   Multiple myeloma in remission (HCC)   CKD (chronic kidney disease) stage 3, GFR 30-59 ml/min (HCC)   Chronic systolic CHF (congestive heart failure) (HCC)   Sepsis secondary to UTI ([place])   Sepsis secondary to suspected complicated UTI in setting of urologic instrumentation  Underwent diagnostic right ureteroscopy and ureteral stent removal 7/17. Urology consulted, no acute urologic intervention indicated.  Renal US: Unremarkable renal ultrasound.  No hydronephrosis. Urine culture negative Blood cultures x 2 negative to date  Completed 3 days of IV cefepime --> transition to Keflex to treat total 10 days.   AKI on CKD 3 Baseline creatinine 1.5 with GFR 54 Creatinine on presentation 2.11 Resolved back to baseline 1.49 today   QTC prolongation EKG done on 12/19/17 which revealed QTC 616 Repeat EKG reviewed independently QTc 535  Avoid QTC prolonging agents  Elevated troponin, resolved Denies chest pain Possibly demand ischemia Peaked at 0.17 on 12/18/17 Trended down to 0.04 on 12/19/2017  Asymptomatic nonsustained V. tach 6 beats reported by RN on 12/19/2017  Chronic A. fib Continue Xarelto for CVA prophylaxis  Chronic combined diastolic and systolic CHF Last 2D echo done on 10/04/2017 revealed LVEF 30 to 35% with diffuse hypokinesis and grade 2 diastolic dysfunction Continue strict I's and O's Continue daily weight  CAD Continue lipitor   Morbid obesity BMI 46  Type 2 diabetes Last A1c 6.3 on 11/10/2017 Lantus and SSI   OSA Continue CPAP at night    Discharge Instructions  Discharge Instructions    (HEART FAILURE PATIENTS) Call MD:  Anytime you have any of the following symptoms: 1) 3 pound weight gain in 24 hours or 5  pounds in 1 week 2) shortness of breath, with or without a dry hacking cough 3) swelling in the hands, feet or stomach 4) if  you have to sleep on extra pillows at night in order to breathe.   Complete by:  As directed    Call MD for:  difficulty breathing, headache or visual disturbances   Complete by:  As directed    Call MD for:  extreme fatigue   Complete by:  As directed    Call MD for:  hives   Complete by:  As directed    Call MD for:  persistant dizziness or light-headedness   Complete by:  As directed    Call MD for:  persistant nausea and vomiting   Complete by:  As directed    Call MD for:  severe uncontrolled pain   Complete by:  As directed    Call MD for:  temperature >100.4   Complete by:  As directed    Diet Carb Modified   Complete by:  As directed    Discharge instructions   Complete by:  As directed    You were cared for by a hospitalist during your hospital stay. If you have any questions about your discharge medications or the care you received while you were in the hospital after you are discharged, you can call the unit and ask to speak with the hospitalist on call if the hospitalist that took care of you is not available. Once you are discharged, your primary care physician will handle any further medical issues. Please note that NO REFILLS for any discharge medications will be authorized once you are discharged, as it is imperative that you return to your primary care physician (or establish a relationship with a primary care physician if you do not have one) for your aftercare needs so that they can reassess your need for medications and monitor your lab values.   Increase activity slowly   Complete by:  As directed      Allergies as of 12/21/2017      Reactions   Beef-derived Products Diarrhea   Pork-derived Products Diarrhea      Medication List    TAKE these medications   acetaminophen 500 MG tablet Commonly known as:  TYLENOL Take 500-1,000 mg by mouth every 6 (six) hours as needed for mild pain or moderate pain.   acyclovir 400 MG tablet Commonly known as:  ZOVIRAX Take  1 tablet (400 mg total) by mouth every morning.   allopurinol 300 MG tablet Commonly known as:  ZYLOPRIM TAKE ONE TABLET BY MOUTH ONCE DAILY.   aspirin EC 81 MG tablet Take 81 mg by mouth daily.   atorvastatin 80 MG tablet Commonly known as:  LIPITOR TAKE ONE TABLET BY MOUTH AT BEDTIME.   calcitRIOL 0.25 MCG capsule Commonly known as:  ROCALTROL Take 0.25 mcg by mouth every Monday.   CALCIUM 600 PO Take 1 tablet by mouth 2 (two) times daily.   cephALEXin 500 MG capsule Commonly known as:  KEFLEX Take 1 capsule (500 mg total) by mouth every 8 (eight) hours for 5 days.   clotrimazole 1 % cream Commonly known as:  LOTRIMIN Apply 1 application topically 2 (two) times daily.   DEXILANT 60 MG capsule Generic drug:  dexlansoprazole Take 60 mg by mouth every morning.   dicyclomine 20 MG tablet Commonly known as:  BENTYL TAKE 1 TABLET BY MOUTH BEFORE MEALS 3 TIMES DAILY.   DRY EYES OP Place 1  drop into both eyes as needed (dry eyes).   gabapentin 300 MG capsule Commonly known as:  NEURONTIN Take 1 capsule (300 mg total) by mouth 2 (two) times daily.   insulin detemir 100 UNIT/ML injection Commonly known as:  LEVEMIR Inject 0.1 mLs (10 Units total) into the skin every morning.   insulin lispro 100 UNIT/ML injection Commonly known as:  HUMALOG Inject 0-0.1 mLs (0-10 Units total) into the skin 2 (two) times daily with a meal. Sliding Scale per patient  CBG 150-200: 1 units CBG 201-275:  3 units CBG 275-350: 5 units 351-425: 7 units 425-500: 9 units 500+: 10 units   loratadine 10 MG tablet Commonly known as:  CLARITIN Take 10 mg by mouth every morning.   multivitamins ther. w/minerals Tabs tablet Take 1 tablet by mouth daily.   nitroGLYCERIN 0.4 MG SL tablet Commonly known as:  NITROSTAT DISSOLVE 1 TABLET UNDER TONGUE EVERY 5 MINUTES UP TO 15 MIN FOR CHESTPAIN. IF NO RELIEF CALL 911.   oxyCODONE-acetaminophen 5-325 MG tablet Commonly known as:  PERCOCET Take  1 tablet by mouth every 4 (four) hours as needed for severe pain.   potassium chloride SA 20 MEQ tablet Commonly known as:  K-DUR,KLOR-CON Take 1 tablet (20 mEq total) by mouth daily. What changed:  when to take this   SLOW-MAG PO Take 1 tablet by mouth every morning.   torsemide 20 MG tablet Commonly known as:  DEMADEX Take 1 tablet (20 mg total) by mouth 2 (two) times daily. Take extra 1 tab for weight gain of 3 lbs in 1 day or 5 lbs in 2 days   VIBERZI 100 MG Tabs Generic drug:  Eluxadoline Take 100 mg 2 (two) times daily with a meal by mouth.   Vitamin D (Ergocalciferol) 50000 units Caps capsule Commonly known as:  DRISDOL TAKE 1 CAPSULE BY MOUTH EVERY THIRTY DAYS.   XARELTO 20 MG Tabs tablet Generic drug:  rivaroxaban TAKE 1 TABLET BY MOUTH DAILY.      Follow-up Information    McKenzie, Candee Furbish, MD Follow up on 12/23/2017.   Specialty:  Urology Why:  3:00 PM Contact information: [place] [address] [phone]          Allergies  Allergen Reactions  . Beef-Derived Products Diarrhea  . Pork-Derived Products Diarrhea    Consultations:  Urology    Procedures/Studies: Dg Chest 2 View  Result Date: 12/17/2017 CLINICAL DATA:  Sepsis.  Fever. EXAM: CHEST - 2 VIEW COMPARISON:  11/09/2017 FINDINGS: Cardiomegaly. Left diaphragmatic hernia containing large bowel again noted, similar to prior study. Adjacent left base opacity likely reflects atelectasis. Mild vascular congestion. No overt edema. IMPRESSION: Cardiomegaly.  Vascular congestion. Left diaphragmatic hernia containing large bowel. Adjacent left base atelectasis. Electronically Signed   By: Rolm Baptise M.D.   On: 12/17/2017 14:11   US Renal  Result Date: 12/17/2017 CLINICAL DATA:  Acute kidney injury EXAM: RENAL / URINARY TRACT ULTRASOUND COMPLETE COMPARISON:  CT 11/10/2016 FINDINGS: Right Kidney: Length: 14.2 cm. Echogenicity within normal limits. No mass or hydronephrosis  visualized. Left Kidney: Length: 13.9 cm. Echogenicity within normal limits. No mass or hydronephrosis visualized. Bladder: Appears normal for degree of bladder distention. IMPRESSION: Unremarkable renal ultrasound.  No hydronephrosis. Electronically Signed   By: Rolm Baptise M.D.   On: 12/17/2017 17:47   Dg C-arm 1-60 Min-no Report  Result Date: 12/16/2017 Fluoroscopy was utilized by the requesting physician.  No radiographic interpretation.  Discharge Exam: Vitals:   12/20/17 2037 12/21/17 0531  BP: 126/76 108/74  Pulse: 70 61  Resp: 20 (!) 24  Temp: 99.8 F (37.7 C) 98.7 F (37.1 C)  SpO2: 100% 100%    General: Pt is alert, awake, not in acute distress Cardiovascular: RRR, S1/S2 +, no rubs, no gallops Respiratory: CTA bilaterally, no wheezing, no rhonchi Abdominal: Soft, NT, ND, bowel sounds + Extremities: no edema, no cyanosis    The results of significant diagnostics from this hospitalization (including imaging, microbiology, ancillary and laboratory) are listed below for reference.     Microbiology: Recent Results (from the past 240 hour(s))  Culture, blood (Routine x 2)     Status: None (Preliminary result)   Collection Time: 12/17/17  1:23 PM  Result Value Ref Range Status   Specimen Description   Final    BLOOD RIGHT ANTECUBITAL Performed at [facility] [address] [place], [place] [address]    Special Requests   Final    BOTTLES DRAWN AEROBIC AND ANAEROBIC Blood Culture adequate volume Performed at [facility] [address], [place], [place] [address]    Culture   Final    NO GROWTH 3 DAYS Performed at [facility], [place] [address], [place], [place] [address]    Report Status PENDING  Incomplete  Culture, blood (Routine x 2)     Status: None (Preliminary result)   Collection Time: 12/17/17  1:23 PM  Result Value Ref Range Status   Specimen Description   Final    BLOOD LEFT ANTECUBITAL Performed at  [facility] [address] [place], [place] [address]    Special Requests   Final    BOTTLES DRAWN AEROBIC AND ANAEROBIC Blood Culture results may not be optimal due to an excessive volume of blood received in culture bottles Performed at [facility] [address], [place], [place] [address]    Culture   Final    NO GROWTH 3 DAYS Performed at [facility], [place] [address], [place], [place] [address]    Report Status PENDING  Incomplete  Urine culture     Status: None   Collection Time: 12/17/17  1:23 PM  Result Value Ref Range Status   Specimen Description   Final    URINE, CLEAN CATCH Performed at [facility], [place] [address], [place], [place] [address]    Special Requests   Final    NONE Performed at [facility], [place] [address] [place], [place] [address]    Culture   Final    NO GROWTH Performed at [facility], [place] [address], [place], [place] [address]    Report Status 12/18/2017 FINAL  Final  MRSA PCR Screening     Status: Abnormal   Collection Time: 12/17/17 10:35 PM  Result Value Ref Range Status   MRSA by PCR POSITIVE (A) NEGATIVE Final    Comment:        The GeneXpert MRSA Assay (FDA approved for NASAL specimens only), is one component of a comprehensive MRSA colonization surveillance program. It is not intended to diagnose MRSA infection nor to guide or monitor treatment for MRSA infections. RESULT CALLED TO, READ BACK BY AND VERIFIED WITH: T RYAN,RN '@0303'  12/18/17 MKELLY Performed at [facility], [place] [address], [place], [place] [address]      Labs: BNP (last 3 results) Recent Labs    08/13/17 1310  BNP 076.2*   Basic Metabolic Panel: Recent Labs  Lab 12/17/17 1323 12/18/17 0627 12/19/17 0535 12/20/17 0555 12/21/17 0623  NA 141 140 142 140 142  K 4.3 3.9 3.8 3.7 3.8  CL 110 111 115* 114* 116*  CO2 24 20* 20* 19* 20*  GLUCOSE  129* 128* 95 130* 108*  BUN 39* 36* 32* 31* 25*  CREATININE 2.11* 1.86* 1.74* 1.59* 1.49*  CALCIUM 8.6* 8.0* 7.9* 7.7* 8.4*  MG  --  1.8  --   --   --   PHOS  --  3.2  --   --   --    Liver Function Tests: Recent Labs  Lab 12/17/17 1323 12/18/17 0627  AST 41 23  ALT 47* 29  ALKPHOS 115 79  BILITOT 0.6 0.3  PROT 7.1 5.5*  ALBUMIN 3.3* 2.6*   No results for input(s): LIPASE, AMYLASE in the last 168 hours. No results for input(s): AMMONIA in the last 168 hours. CBC: Recent Labs  Lab 12/17/17 1323 12/18/17 0627 12/19/17 0535 12/20/17 0555 12/21/17 0623  WBC 10.3 7.7 7.1 7.3 5.7  NEUTROABS 7.0 4.5  --   --   --   HGB 10.5* 8.9* 8.7* 8.8* 10.0*  HCT 33.2* 28.5* 27.6* 27.6* 31.5*  MCV 96.2 96.9 96.8 95.5 95.7  PLT 185 155 149* 161 183   Cardiac Enzymes: Recent Labs  Lab 12/17/17 1323 12/17/17 1830 12/17/17 2211 12/18/17 0627 12/19/17 1311  TROPONINI 0.10* 0.07* 0.12* 0.17* 0.04*   BNP: Invalid input(s): POCBNP CBG: Recent Labs  Lab 12/20/17 0753 12/20/17 1159 12/20/17 1653 12/20/17 2040 12/21/17 0729  GLUCAP 123* 275* 103* 113* 101*   D-Dimer No results for input(s): DDIMER in the last 72 hours. Hgb A1c No results for input(s): HGBA1C in the last 72 hours. Lipid Profile No results for input(s): CHOL, HDL, LDLCALC, TRIG, CHOLHDL, LDLDIRECT in the last 72 hours. Thyroid function studies No results for input(s): TSH, T4TOTAL, T3FREE, THYROIDAB in the last 72 hours.  Invalid input(s): FREET3 Anemia work up No results for input(s): VITAMINB12, FOLATE, FERRITIN, TIBC, IRON, RETICCTPCT in the last 72 hours. Urinalysis    Component Value Date/Time   COLORURINE YELLOW 12/17/2017 1323   APPEARANCEUR CLOUDY (A) 12/17/2017 1323   LABSPEC 1.009 12/17/2017 1323   PHURINE 5.0 12/17/2017 1323   GLUCOSEU NEGATIVE 12/17/2017 1323   HGBUR LARGE (A) 12/17/2017 1323   BILIRUBINUR NEGATIVE 12/17/2017 1323   KETONESUR NEGATIVE 12/17/2017 1323   PROTEINUR 100 (A)  12/17/2017 1323   UROBILINOGEN 0.2 10/03/2012 1809   NITRITE NEGATIVE 12/17/2017 1323   LEUKOCYTESUR LARGE (A) 12/17/2017 1323   Sepsis Labs Invalid input(s): PROCALCITONIN,  WBC,  LACTICIDVEN Microbiology Recent Results (from the past 240 hour(s))  Culture, blood (Routine x 2)     Status: None (Preliminary result)   Collection Time: 12/17/17  1:23 PM  Result Value Ref Range Status   Specimen Description   Final    BLOOD RIGHT ANTECUBITAL Performed at [facility], [place] [address] [place], [place] [address]    Special Requests   Final    BOTTLES DRAWN AEROBIC AND ANAEROBIC Blood Culture adequate volume Performed at [facility] [address], [place], [place] [address]    Culture   Final    NO GROWTH 3 DAYS Performed at [facility], [place] [address] [place],  [address]    Report Status PENDING  Incomplete  Culture, blood (Routine x 2)     Status:  None (Preliminary result)   Collection Time: 12/17/17  1:23 PM  Result Value Ref Range Status   Specimen Description   Final    BLOOD LEFT ANTECUBITAL Performed at [facility] [address] [place], [place] [address]    Special Requests   Final    BOTTLES DRAWN AEROBIC AND ANAEROBIC Blood Culture results may not be optimal due to an excessive volume of blood received in culture bottles Performed at [facility] [address], [place], [place] [address]    Culture   Final    NO GROWTH 3 DAYS Performed at [facility], [place] [address], [place], [place] [address]    Report Status PENDING  Incomplete  Urine culture     Status: None   Collection Time: 12/17/17  1:23 PM  Result Value Ref Range Status   Specimen Description   Final    URINE, CLEAN CATCH Performed at [facility], [place] [address] [place], [place] [address]    Special Requests   Final    NONE Performed at [facility], [place]  [address] [place], [place] [address]    Culture   Final    NO GROWTH Performed at [facility], [place] [address], [place], [place] [address]    Report Status 12/18/2017 FINAL  Final  MRSA PCR Screening     Status: Abnormal   Collection Time: 12/17/17 10:35 PM  Result Value Ref Range Status   MRSA by PCR POSITIVE (A) NEGATIVE Final    Comment:        The GeneXpert MRSA Assay (FDA approved for NASAL specimens only), is one component of a comprehensive MRSA colonization surveillance program. It is not intended to diagnose MRSA infection nor to guide or monitor treatment for MRSA infections. RESULT CALLED TO, READ BACK BY AND VERIFIED WITH: T RYAN,RN '@0303'  12/18/17 MKELLY Performed at [facility], [place] [address], [place], [place] [address]      Patient was seen and examined on the day of discharge and was found to be in stable condition. Time coordinating discharge: 35 minutes including assessment and coordination of care, as well as examination of the patient.   SIGNED:  Dessa Phi, DO [facility] Pager [phone]  If 7PM-7AM, please contact night-coverage [email or web address] Password [facility] 12/21/2017, 11:20 AM

## 2017-12-22 LAB — CULTURE, BLOOD (ROUTINE X 2)
Culture: NO GROWTH
Culture: NO GROWTH
Special Requests: ADEQUATE

## 2017-12-23 ENCOUNTER — Ambulatory Visit (INDEPENDENT_AMBULATORY_CARE_PROVIDER_SITE_OTHER): Payer: BLUE CROSS/BLUE SHIELD | Admitting: Urology

## 2017-12-23 DIAGNOSIS — N200 Calculus of kidney: Secondary | ICD-10-CM | POA: Diagnosis not present

## 2017-12-28 ENCOUNTER — Other Ambulatory Visit: Payer: Self-pay | Admitting: Cardiovascular Disease

## 2017-12-28 ENCOUNTER — Encounter: Payer: Self-pay | Admitting: *Deleted

## 2017-12-29 ENCOUNTER — Encounter: Payer: Self-pay | Admitting: Cardiovascular Disease

## 2017-12-29 ENCOUNTER — Ambulatory Visit: Payer: BLUE CROSS/BLUE SHIELD | Admitting: Cardiovascular Disease

## 2017-12-29 VITALS — BP 129/81 | HR 65 | Ht 74.0 in | Wt 339.0 lb

## 2017-12-29 DIAGNOSIS — Z955 Presence of coronary angioplasty implant and graft: Secondary | ICD-10-CM | POA: Diagnosis not present

## 2017-12-29 DIAGNOSIS — I5022 Chronic systolic (congestive) heart failure: Secondary | ICD-10-CM

## 2017-12-29 DIAGNOSIS — I10 Essential (primary) hypertension: Secondary | ICD-10-CM

## 2017-12-29 DIAGNOSIS — I4892 Unspecified atrial flutter: Secondary | ICD-10-CM

## 2017-12-29 DIAGNOSIS — I25118 Atherosclerotic heart disease of native coronary artery with other forms of angina pectoris: Secondary | ICD-10-CM | POA: Diagnosis not present

## 2017-12-29 MED ORDER — ATORVASTATIN CALCIUM 80 MG PO TABS: 80.0000 mg | ORAL_TABLET | Freq: Every day | ORAL | 3 refills | Status: DC

## 2017-12-29 NOTE — Progress Notes (Signed)
SUBJECTIVE: The patient presents for follow-up for coronary artery disease, chronic systolic heart failure, and atrial flutter.  I last saw him in March 2018.  Most recent echocardiogram performed on 10/04/2017 showed moderate to severely reduced left ventricular systolic function, LVEF 30 to 35%, severe left ventricular dilatation, diffuse hypokinesis, grade 2 diastolic dysfunction with high ventricular filling pressures, and reduced right ventricular systolic function.  There was severe left atrial dilatation.  He also has chronic kidney disease, sleep apnea, hypertension, and multiple myeloma.  He was recently hospitalized for sepsis secondary to suspected complicated UTI in the setting of urologic instrumentation.  He had bradycardia while hospitalized in March 2019 with generalized weakness and metoprolol was discontinued.  He is here with his wife.  He said this is the strongest he has felt since November 2018.  He denies chest pain, palpitations, shortness of breath.  He has not gotten back to work yet.  He is grateful for his life and the support of his wife.    Review of Systems: As per "subjective", otherwise negative.  Allergies  Allergen Reactions  . Beef-Derived Products Diarrhea  . Pork-Derived Products Diarrhea    Current Outpatient Medications  Medication Sig Dispense Refill  . acetaminophen (TYLENOL) 500 MG tablet Take 500-1,000 mg by mouth every 6 (six) hours as needed for mild pain or moderate pain.    Marland Kitchen acyclovir (ZOVIRAX) 400 MG tablet Take 1 tablet (400 mg total) by mouth every morning. 30 tablet 5  . allopurinol (ZYLOPRIM) 300 MG tablet TAKE ONE TABLET BY MOUTH ONCE DAILY. 30 tablet 2  . Artificial Tear Ointment (DRY EYES OP) Place 1 drop into both eyes as needed (dry eyes).    Marland Kitchen aspirin EC 81 MG tablet Take 81 mg by mouth daily.    Marland Kitchen atorvastatin (LIPITOR) 80 MG tablet TAKE ONE TABLET BY MOUTH AT BEDTIME. 10 tablet 0  . calcitRIOL (ROCALTROL) 0.25 MCG  capsule Take 0.25 mcg by mouth every Monday.     . Calcium Carbonate (CALCIUM 600 PO) Take 1 tablet by mouth 2 (two) times daily.    . clotrimazole (LOTRIMIN) 1 % cream Apply 1 application topically 2 (two) times daily.    Marland Kitchen dexlansoprazole (DEXILANT) 60 MG capsule Take 60 mg by mouth every morning.    . dicyclomine (BENTYL) 20 MG tablet TAKE 1 TABLET BY MOUTH BEFORE MEALS 3 TIMES DAILY. 90 tablet 3  . Eluxadoline (VIBERZI) 100 MG TABS Take 100 mg 2 (two) times daily with a meal by mouth.     . gabapentin (NEURONTIN) 300 MG capsule Take 1 capsule (300 mg total) by mouth 2 (two) times daily. 60 capsule 0  . insulin detemir (LEVEMIR) 100 UNIT/ML injection Inject 0.1 mLs (10 Units total) into the skin every morning. 10 mL 11  . insulin lispro (HUMALOG) 100 UNIT/ML injection Inject 0-0.1 mLs (0-10 Units total) into the skin 2 (two) times daily with a meal. Sliding Scale per patient  CBG 150-200: 1 units CBG 201-275:  3 units CBG 275-350: 5 units 351-425: 7 units 425-500: 9 units 500+: 10 units 10 mL 0  . loratadine (CLARITIN) 10 MG tablet Take 10 mg by mouth every morning.     . Magnesium Cl-Calcium Carbonate (SLOW-MAG PO) Take 1 tablet by mouth every morning.    . Multiple Vitamins-Minerals (MULTIVITAMINS THER. W/MINERALS) TABS Take 1 tablet by mouth daily.     . nitroGLYCERIN (NITROSTAT) 0.4 MG SL tablet DISSOLVE 1 TABLET UNDER TONGUE EVERY  [place]. IF NO RELIEF CALL 911. 25 tablet 0  . oxyCODONE-acetaminophen (PERCOCET) 5-325 MG tablet Take 1 tablet by mouth every 4 (four) hours as needed for severe pain. 15 tablet 0  . potassium chloride SA (K-DUR,KLOR-CON) 20 MEQ tablet Take 1 tablet (20 mEq total) by mouth daily. (Patient taking differently: Take 20 mEq by mouth 2 (two) times daily. ) 30 tablet 0  . torsemide (DEMADEX) 20 MG tablet Take 1 tablet (20 mg total) by mouth 2 (two) times daily. Take extra 1 tab for weight gain of 3 lbs in 1 day or 5 lbs in 2 days 90  tablet 3  . Vitamin D, Ergocalciferol, (DRISDOL) 50000 units CAPS capsule TAKE 1 CAPSULE BY MOUTH EVERY THIRTY DAYS. 3 capsule PRN  . XARELTO 20 MG TABS tablet TAKE 1 TABLET BY MOUTH DAILY. 30 tablet 6   No current facility-administered medications for this visit.    Facility-Administered Medications Ordered in Other Visits  Medication Dose Route Frequency Provider Last Rate Last Dose  . sodium chloride 0.9 % injection 10 mL  10 mL Intravenous Once Farrel Gobble, MD        Past Medical History:  Diagnosis Date  . Abscess 04/2017   Scrotal  . Anemia   . Arteriosclerotic cardiovascular disease (ASCVD)    MI-2000s; stent to the proximal LAD and diagonal in 2001; stress nuclear in 2008-impaired exercise capacity, left ventricular dilatation, moderately to severely depressed EF, apical, inferior and anteroseptal scar  . Arthritis   . Atrial flutter ([place])   . 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 Cellulitis of leg    both legs  . Chronic diarrhea   . Chronic kidney disease, stage 3, mod decreased GFR (HCC)    Creatinine of 1.84 in 06/2011 and 1.5 in 07/2011  . Diabetes mellitus    Insulin  . Dysrhythmia    AFlutter  . GERD (gastroesophageal reflux disease)   . Gout   . Hyperlipidemia   . Hypertension   . Injection site reaction   . Multiple myeloma 07/01/2011  . Myocardial infarction ([place]) 2000  . Obesity   . Pedal edema    Venous insufficiency  . Sleep apnea    uses cpap  . Ulcer     Past Surgical History:  Procedure Laterality Date  . ABSCESS DRAINAGE     Scrotal  . BIOPSY  01/02/2012   Procedure: BIOPSY;  Surgeon: Rogene Houston, MD;  [facility];  Service: Endoscopy;  Laterality: N/A;  . BONE MARROW BIOPSY  05/13/11  . CARDIAC CATHETERIZATION     cardiac stent  . CARDIOVERSION N/A 10/13/2012   Procedure: CARDIOVERSION;  Surgeon: Yehuda Savannah, MD;  Location: [facility];  Service: Cardiovascular;  Laterality: N/A;  . CATARACT EXTRACTION W/PHACO Left  02/13/2014   Procedure: CATARACT EXTRACTION PHACO AND INTRAOCULAR LENS PLACEMENT ([place]);  Surgeon: Tonny Branch, MD;  Location: [facility];  Service: Ophthalmology;  Laterality: Left;  CDE:  7.67  . CATARACT EXTRACTION W/PHACO Right 03/02/2014   Procedure: CATARACT EXTRACTION PHACO AND INTRAOCULAR LENS PLACEMENT RIGHT EYE CDE=16.81;  Surgeon: Tonny Branch, MD;  Location: [facility];  Service: Ophthalmology;  Laterality: Right;  . COLONOSCOPY  11/28/2011   Procedure: COLONOSCOPY;  Surgeon: Rogene Houston, MD;  [facility];  Service: Endoscopy;  Laterality: N/A;  930  . CYSTOSCOPY W/ RETROGRADES Right 12/16/2017   Procedure: CYSTOSCOPY WITH RIGHT RETROGRADE PYELOGRAM AND RIGHT URETERAL STENT REMOVAL;  Surgeon:  McKenzie, Candee Furbish, MD;  Location: [facility];  Service: Urology;  Laterality: Right;  . CYSTOSCOPY W/ URETERAL STENT PLACEMENT Right 07/31/2017   Procedure: CYSTOSCOPY WITH RETROGRADE PYELOGRAM/URETERAL STENT PLACEMENT;  Surgeon: Cleon Gustin, MD;  [facility];  Service: Urology;  Laterality: Right;  . ESOPHAGOGASTRODUODENOSCOPY  01/02/2012   Procedure: ESOPHAGOGASTRODUODENOSCOPY (EGD);  Surgeon: Rogene Houston, MD;  [facility];  Service: Endoscopy;  Laterality: N/A;  100  . ESOPHAGOGASTRODUODENOSCOPY N/A 09/20/2012   Procedure: ESOPHAGOGASTRODUODENOSCOPY (EGD);  Surgeon: Rogene Houston, MD;  [facility];  Service: Endoscopy;  Laterality: N/A;  . EUS N/A 10/07/2012   Procedure: UPPER ENDOSCOPIC ULTRASOUND (EUS) LINEAR;  Surgeon: Milus Banister, MD;  [facility];  Service: Endoscopy;  Laterality: N/A;  . INCISION AND DRAINAGE ABSCESS N/A 04/14/2017   Procedure: INCISION AND DRAINAGE ABSCESS;  Surgeon: Ceasar Mons, MD;  [facility];  Service: Urology;  Laterality: N/A;  . INCISION AND DRAINAGE ABSCESS ANAL    . IRRIGATION AND DEBRIDEMENT ABSCESS N/A 04/15/2017   Procedure: DEBRIDEMENT SCROTAL WOUND AND DRESSING CHANGE;  Surgeon: Ceasar Mons, MD;  [facility];  Service: Urology;  Laterality: N/A;  . IRRIGATION AND DEBRIDEMENT ABSCESS N/A 04/17/2017   Procedure: IRRIGATION AND DEBRIDEMENT ABSCESS;  Surgeon: Ceasar Mons, MD;  [facility];  Service: Urology;  Laterality: N/A;  RM 3  . LAPAROSCOPIC GASTRIC BANDING  2006   has been removed  . OSTECTOMY Right 04/08/2017   Procedure: OSTECTOMY RIGHT GREAT TOE;  Surgeon: Caprice Beaver, DPM;  Location[facility];  Service: Podiatry;  Laterality: Right;  . PORT-A-CATH REMOVAL Left 12/07/2012   Procedure: REMOVAL PORT-A-CATH;  Surgeon: Scherry Ran, MD;  Location[facility];  Service: General;  Laterality: Left;  . PORT-A-CATH REMOVAL N/A 10/02/2017   Procedure: MINOR REMOVAL PORT-A-CATH AT BEDSIDE;  Surgeon: Donnie Mesa, MD;  Location: [facility];  Service: General;  Laterality: N/A;  . PORTACATH PLACEMENT  07/07/2011   Procedure: INSERTION PORT-A-CATH;  Surgeon: Scherry Ran, MD;  [facility];  Service: General;  Laterality: N/A;  . PORTACATH PLACEMENT N/A 12/07/2012   Procedure: INSERTION PORT-A-CATH;  Surgeon: Scherry Ran, MD;  Location: [facility];  Service: General;  Laterality: N/A;  Attempted portacath placement on left and right side  . URETEROSCOPY Right 12/16/2017   Procedure: DIAGNOSTIC RIGHT URETEROSCOPY;  Surgeon: Cleon Gustin, MD;  [facility];  Service: Urology;  Laterality: Right;  . WOUND DEBRIDEMENT Right 04/08/2017   Procedure: EXCISION ULCERATION RIGHT GREAT TOE;  Surgeon: Caprice Beaver, DPM;  Location: [facility];  Service: Podiatry;  Laterality: Right;  . WRIST SURGERY     Left; removal of bone fragment    Social History   Socioeconomic History  . Marital status: Married    Spouse name: Not on file  . Number of children: Not on file  . Years of education: 67  . Highest education level: Not on file  Occupational History  . Occupation: Nurse, learning disability  . Occupation: Optometrist:  [employer]  Social Needs  . Financial resource strain: Not on file  . Food insecurity:    Worry: Not on file    Inability: Not on file  . Transportation needs:    Medical: Not on file    Non-medical: Not on file  Tobacco Use  . Smoking status: Former Smoker    Packs/day: 0.25    Years: 1.00  Pack years: 0.25    Types: Cigarettes, Cigars    Last attempt to quit: 05/17/2001    Years since quitting: 16.6  . Smokeless tobacco: Never Used  Substance and Sexual Activity  . Alcohol use: No    Alcohol/week: 0.0 oz  . Drug use: No  . Sexual activity: Yes    Birth control/protection: None  Lifestyle  . Physical activity:    Days per week: Not on file    Minutes per session: Not on file  . Stress: Not on file  Relationships  . Social connections:    Talks on phone: Not on file    Gets together: Not on file    Attends religious service: Not on file    Active member of club or organization: Not on file    Attends meetings of clubs or organizations: Not on file    Relationship status: Not on file  . Intimate partner violence:    Fear of current or ex partner: Not on file    Emotionally abused: Not on file    Physically abused: Not on file    Forced sexual activity: Not on file  Other Topics Concern  . Not on file  Social History Narrative  . Not on file     There were no vitals filed for this visit.  Wt Readings from Last 3 Encounters:  12/21/17 (!) 364 lb 6.7 oz (165.3 kg)  12/14/17 (!) 346 lb 12.5 oz (157.3 kg)  12/11/17 (!) 347 lb (157.4 kg)     PHYSICAL EXAM General: NAD HEENT: Normal. Neck: No JVD, no thyromegaly. Lungs: Clear to auscultation bilaterally with normal respiratory effort. CV: Regular rate and rhythm, normal S1/S2, no S3/S4, no murmur.  Wearing compression stockings.   Abdomen: Soft, nontender, no distention.  Neurologic: Alert and oriented.  Psych: Normal affect. Skin: Normal. Musculoskeletal: No gross deformities.    ECG:  Reviewed above under Subjective   Labs: Lab Results  Component Value Date/Time   K 3.8 12/21/2017 06:23 AM   BUN 25 (H) 12/21/2017 06:23 AM   CREATININE 1.49 (H) 12/21/2017 06:23 AM   CREATININE 2.24 (H) 10/05/2012 02:11 PM   ALT 29 12/18/2017 06:27 AM   TSH 0.561 09/17/2012 04:13 PM   HGB 10.0 (L) 12/21/2017 06:23 AM     Lipids: Lab Results  Component Value Date/Time   LDLCALC 90 07/18/2010 05:24 PM   CHOL 158 07/18/2010 05:24 PM   TRIG 173 07/18/2010 05:24 PM   HDL 33 07/18/2010 05:24 PM       ASSESSMENT AND PLAN: 1. CAD with prior MI and stents: Symptomatically stable.   Continue Lipitor.  I will stop aspirin as he is on Xarelto.  Metoprolol was previously discontinued due to symptomatic bradycardia and fatigue as noted above.    2. Ischemic cardiomyopathy/chronic systolic heart failure: Euvolemic on torsemide 20 mg twice daily. No changes to therapy.  He is no longer on metoprolol and lisinopril. He wears compression stockings and uses lymphedema pumps.  He is probably not an ideal candidate for an ICD given recent history of sepsis after urologic instrumentation.  This could be revisited in the future.  LVEF now 30 to 35%.  3. Hypertension: Controlled. No changes.  4. Atrial flutter: Symptomatically stable. Continue Xarelto for anticoagulation.    Disposition: Follow up 6 months   Kate Sable, M.D., F.A.C.C.

## 2017-12-29 NOTE — Patient Instructions (Addendum)
Your physician wants you to follow-up in: 6 months with Dr.Koneswaran You will receive a reminder letter in the mail two months in advance. If you don't receive a letter, please call our office to schedule the follow-up appointment.     STOP Aspirin   Al other meds stay the same      No labs or tests today      Thank you for choosing [facility] !

## 2017-12-30 ENCOUNTER — Ambulatory Visit: Payer: BLUE CROSS/BLUE SHIELD | Admitting: Vascular Surgery

## 2017-12-30 ENCOUNTER — Other Ambulatory Visit: Payer: Self-pay

## 2017-12-30 ENCOUNTER — Encounter: Payer: Self-pay | Admitting: Vascular Surgery

## 2017-12-30 ENCOUNTER — Ambulatory Visit (HOSPITAL_COMMUNITY)
Admission: RE | Admit: 2017-12-30 | Discharge: 2017-12-30 | Disposition: A | Discharge status: Home / Self Care | Payer: BLUE CROSS/BLUE SHIELD | Source: Ambulatory Visit | Attending: Vascular Surgery | Admitting: Vascular Surgery

## 2017-12-30 VITALS — BP 127/84 | HR 58 | Temp 98.0°F | Resp 20 | Ht 74.0 in | Wt 339.0 lb

## 2017-12-30 DIAGNOSIS — I8312 Varicose veins of left lower extremity with inflammation: Secondary | ICD-10-CM | POA: Insufficient documentation

## 2017-12-30 DIAGNOSIS — I872 Venous insufficiency (chronic) (peripheral): Secondary | ICD-10-CM | POA: Diagnosis not present

## 2017-12-30 NOTE — Progress Notes (Addendum)
Patient name: [name] MRN: [id number] DOB: 1960-07-24 Sex: male   REASON FOR CONSULT:    Chronic venous insufficiency.  The consult is requested by Dr. [name].  HPI:   [name] is a pleasant 57 y.o. male, who states that he has had issues with leg swelling for 3 to 4 years.  The symptoms came on gradually.  He experiences aching pain and heaviness in his legs which is aggravated by sitting and standing and relieved somewhat with elevation.  He did have some ulcers on his legs on the left which ultimately healed.  He got a lymphatic compression pump at that time which she thinks did help.  He is unaware of any previous history of DVT or phlebitis.  Currently his activity is fairly limited.  I do not get any history of claudication or rest pain.  I have reviewed the records from the referring office.  The patient was seen on 07/14/2017.  The patient had significant left lower extremity edema and has had some skin changes on the left.  He is referred for evaluation of chronic venous insufficiency.  Past Medical History:  Diagnosis Date  . Abscess 04/2017   Scrotal  . Anemia   . Arteriosclerotic cardiovascular disease (ASCVD)    MI-2000s; stent to the proximal LAD and diagonal in 2001; stress nuclear in 2008-impaired exercise capacity, left ventricular dilatation, moderately to severely depressed EF, apical, inferior and anteroseptal scar  . Arthritis   . Atrial flutter ([place])   . 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 Cellulitis of leg    both legs  . Chronic diarrhea   . Chronic kidney disease, stage 3, mod decreased GFR (HCC)    Creatinine of 1.84 in 06/2011 and 1.5 in 07/2011  . Diabetes mellitus    Insulin  . Dysrhythmia    AFlutter  . GERD (gastroesophageal reflux disease)   . Gout   . Hyperlipidemia   . Hypertension   . Injection site reaction   . Multiple myeloma 07/01/2011  . Myocardial infarction ([place]) 2000  . Obesity   . Pedal edema    Venous  insufficiency  . Sleep apnea    uses cpap  . Ulcer     Family History  Problem Relation Age of Onset  . Heart disease Mother   . Cancer Mother   . Diabetes Father   . Arthritis Unknown   . Anesthesia problems Neg Hx   . Hypotension Neg Hx   . Malignant hyperthermia Neg Hx   . Pseudochol deficiency Neg Hx     SOCIAL HISTORY: Social History   Socioeconomic History  . Marital status: Married    Spouse name: Not on file  . Number of children: Not on file  . Years of education: 71  . Highest education level: Not on file  Occupational History  . Occupation: Nurse, learning disability  . Occupation: Optometrist: [employer]  Social Needs  . Financial resource strain: Not on file  . Food insecurity:    Worry: Not on file    Inability: Not on file  . Transportation needs:    Medical: Not on file    Non-medical: Not on file  Tobacco Use  . Smoking status: Former Smoker    Packs/day: 0.25    Years: 1.00    Pack years: 0.25    Types: Cigarettes, Cigars    Last attempt to quit: 05/17/2001    Years since quitting: 16.6  .  Smokeless tobacco: Never Used  Substance and Sexual Activity  . Alcohol use: No    Alcohol/week: 0.0 oz  . Drug use: No  . Sexual activity: Yes    Birth control/protection: None  Lifestyle  . Physical activity:    Days per week: Not on file    Minutes per session: Not on file  . Stress: Not on file  Relationships  . Social connections:    Talks on phone: Not on file    Gets together: Not on file    Attends religious service: Not on file    Active member of club or organization: Not on file    Attends meetings of clubs or organizations: Not on file    Relationship status: Not on file  . Intimate partner violence:    Fear of current or ex partner: Not on file    Emotionally abused: Not on file    Physically abused: Not on file    Forced sexual activity: Not on file  Other Topics Concern  . Not on file  Social History  Narrative  . Not on file    Allergies  Allergen Reactions  . Beef-Derived Products Diarrhea  . Pork-Derived Products Diarrhea    Current Outpatient Medications  Medication Sig Dispense Refill  . acetaminophen (TYLENOL) 500 MG tablet Take 500-1,000 mg by mouth every 6 (six) hours as needed for mild pain or moderate pain.    Marland Kitchen acyclovir (ZOVIRAX) 400 MG tablet Take 1 tablet (400 mg total) by mouth every morning. 30 tablet 5  . allopurinol (ZYLOPRIM) 300 MG tablet TAKE ONE TABLET BY MOUTH ONCE DAILY. 30 tablet 2  . Artificial Tear Ointment (DRY EYES OP) Place 1 drop into both eyes as needed (dry eyes).    Marland Kitchen atorvastatin (LIPITOR) 80 MG tablet Take 1 tablet (80 mg total) by mouth at bedtime. 90 tablet 3  . calcitRIOL (ROCALTROL) 0.25 MCG capsule Take 0.25 mcg by mouth every Monday.     . Calcium Carbonate (CALCIUM 600 PO) Take 1 tablet by mouth 2 (two) times daily.    . clotrimazole (LOTRIMIN) 1 % cream Apply 1 application topically 2 (two) times daily.    Marland Kitchen dexlansoprazole (DEXILANT) 60 MG capsule Take 60 mg by mouth every morning.    . dicyclomine (BENTYL) 20 MG tablet TAKE 1 TABLET BY MOUTH BEFORE MEALS 3 TIMES DAILY. 90 tablet 3  . Eluxadoline (VIBERZI) 100 MG TABS Take 100 mg 2 (two) times daily with a meal by mouth.     . gabapentin (NEURONTIN) 300 MG capsule Take 1 capsule (300 mg total) by mouth 2 (two) times daily. 60 capsule 0  . insulin detemir (LEVEMIR) 100 UNIT/ML injection Inject 0.1 mLs (10 Units total) into the skin every morning. 10 mL 11  . insulin lispro (HUMALOG) 100 UNIT/ML injection Inject 0-0.1 mLs (0-10 Units total) into the skin 2 (two) times daily with a meal. Sliding Scale per patient  CBG 150-200: 1 units CBG 201-275:  3 units CBG 275-350: 5 units 351-425: 7 units 425-500: 9 units 500+: 10 units 10 mL 0  . loratadine (CLARITIN) 10 MG tablet Take 10 mg by mouth every morning.     . Magnesium Cl-Calcium Carbonate (SLOW-MAG PO) Take 1 tablet by mouth every  morning.    . Multiple Vitamins-Minerals (MULTIVITAMINS THER. W/MINERALS) TABS Take 1 tablet by mouth daily.     . nitroGLYCERIN (NITROSTAT) 0.4 MG SL tablet DISSOLVE 1 TABLET UNDER TONGUE EVERY 5 MINUTES UP TO 15  MIN FOR CHESTPAIN. IF NO RELIEF CALL 911. 25 tablet 0  . oxyCODONE-acetaminophen (PERCOCET) 5-325 MG tablet Take 1 tablet by mouth every 4 (four) hours as needed for severe pain. 15 tablet 0  . potassium chloride SA (K-DUR,KLOR-CON) 20 MEQ tablet Take 1 tablet (20 mEq total) by mouth daily. (Patient taking differently: Take 20 mEq by mouth 2 (two) times daily. ) 30 tablet 0  . torsemide (DEMADEX) 20 MG tablet Take 1 tablet (20 mg total) by mouth 2 (two) times daily. Take extra 1 tab for weight gain of 3 lbs in 1 day or 5 lbs in 2 days 90 tablet 3  . Vitamin D, Ergocalciferol, (DRISDOL) 50000 units CAPS capsule TAKE 1 CAPSULE BY MOUTH EVERY THIRTY DAYS. 3 capsule PRN  . XARELTO 20 MG TABS tablet TAKE 1 TABLET BY MOUTH DAILY. 30 tablet 6   No current facility-administered medications for this visit.    Facility-Administered Medications Ordered in Other Visits  Medication Dose Route Frequency Provider Last Rate Last Dose  . sodium chloride 0.9 % injection 10 mL  10 mL Intravenous Once Farrel Gobble, MD        REVIEW OF SYSTEMS:  '[X]'  denotes positive finding, '[ ]'  denotes negative finding Cardiac  Comments:  Chest pain or chest pressure:    Shortness of breath upon exertion:    Short of breath when lying flat:    Irregular heart rhythm: x       Vascular    Pain in calf, thigh, or hip brought on by ambulation:    Pain in feet at night that wakes you up from your sleep:     Blood clot in your veins:    Leg swelling:  x       Pulmonary    Oxygen at home:    Productive cough:     Wheezing:         Neurologic    Sudden weakness in arms or legs:     Sudden numbness in arms or legs:     Sudden onset of difficulty speaking or slurred speech:    Temporary loss of vision in one  eye:     Problems with dizziness:         Gastrointestinal    Blood in stool:     Vomited blood:         Genitourinary    Burning when urinating:     Blood in urine:        Psychiatric    Major depression:         Hematologic    Bleeding problems:    Problems with blood clotting too easily:        Skin    Rashes or ulcers:        Constitutional    Fever or chills:     PHYSICAL EXAM:   Vitals:   12/30/17 1539  BP: 127/84  Pulse: (!) 58  Resp: 20  Temp: 98 F (36.7 C)  TempSrc: Oral  SpO2: 100%  Weight: (!) 339 lb (153.8 kg)  Height: '6\' 2"'  (1.88 m)    GENERAL: The patient is a well-nourished male, in no acute distress. The vital signs are documented above. CARDIAC: There is a regular rate and rhythm.  VASCULAR: I do not detect carotid bruits. He has brisk biphasic dorsalis pedis and posterior tibial signals bilaterally. VENOUS: He has evidence of healed ulcers in the left leg.  He has hyperpigmentation and some lipodermatosclerosis bilaterally.  He  has mild bilateral lower extremity swelling. PULMONARY: There is good air exchange bilaterally without wheezing or rales. ABDOMEN: Soft and non-tender with normal pitched bowel sounds.  MUSCULOSKELETAL: There are no major deformities or cyanosis. NEUROLOGIC: No focal weakness or paresthesias are detected. SKIN: There are no ulcers or rashes noted. PSYCHIATRIC: The patient has a normal affect.  DATA:    VENOUS DUPLEX: I have independently interpreted his venous duplex of the left lower extremity today.  There is no evidence of deep venous thrombosis or superficial thrombophlebitis.  There is deep venous reflux involving the popliteal vein only.  There is superficial venous reflux involving the left great saphenous vein.  The vein is not especially dilated.  MEDICAL ISSUES:   CHRONIC VENOUS INSUFFICIENCY: This patient has  CEAP  clinical class V venous disease.  He has deep venous and also superficial venous  reflux.  Currently he is not a candidate for endovenous laser ablation of the saphenous vein as the vein is not especially dilated.  We have discussed the importance of intermittent leg elevation the proper positioning for this.  I have encouraged him to wear his compression stockings which he has.  I encouraged him to avoid prolonged sitting and standing.  We also discussed the importance of exercise and water aerobics which is also very helpful for patients with venous disease.  In addition we discussed weight management which is also very helpful as central obesity especially increases venous pressure.  I will be happy to see him back at any time if his symptoms progress.  Deitra Mayo [facility] [phone]

## 2018-01-01 ENCOUNTER — Other Ambulatory Visit: Payer: Self-pay | Admitting: Urology

## 2018-01-01 DIAGNOSIS — N200 Calculus of kidney: Secondary | ICD-10-CM

## 2018-01-08 ENCOUNTER — Ambulatory Visit (HOSPITAL_COMMUNITY): Payer: BLUE CROSS/BLUE SHIELD

## 2018-01-11 ENCOUNTER — Other Ambulatory Visit (HOSPITAL_COMMUNITY)
Admission: RE | Admit: 2018-01-11 | Discharge: 2018-01-11 | Disposition: A | Discharge status: Home / Self Care | Payer: BLUE CROSS/BLUE SHIELD | Source: Ambulatory Visit | Attending: Nephrology | Admitting: Nephrology

## 2018-01-11 DIAGNOSIS — R809 Proteinuria, unspecified: Secondary | ICD-10-CM | POA: Insufficient documentation

## 2018-01-11 DIAGNOSIS — E559 Vitamin D deficiency, unspecified: Secondary | ICD-10-CM | POA: Insufficient documentation

## 2018-01-11 DIAGNOSIS — I10 Essential (primary) hypertension: Secondary | ICD-10-CM | POA: Diagnosis present

## 2018-01-11 DIAGNOSIS — Z79899 Other long term (current) drug therapy: Secondary | ICD-10-CM | POA: Insufficient documentation

## 2018-01-11 DIAGNOSIS — N183 Chronic kidney disease, stage 3 (moderate): Secondary | ICD-10-CM | POA: Insufficient documentation

## 2018-01-11 LAB — PROTEIN / CREATININE RATIO, URINE
Creatinine, Urine: 41.74 mg/dL
Protein Creatinine Ratio: 1.03 mg/mg{Cre} — ABNORMAL HIGH (ref 0.00–0.15)
Total Protein, Urine: 43 mg/dL

## 2018-01-11 LAB — RENAL FUNCTION PANEL
Albumin: 3.4 g/dL — ABNORMAL LOW (ref 3.5–5.0)
Anion gap: 5 (ref 5–15)
BUN: 38 mg/dL — ABNORMAL HIGH (ref 6–20)
CO2: 26 mmol/L (ref 22–32)
Calcium: 8.9 mg/dL (ref 8.9–10.3)
Chloride: 111 mmol/L (ref 98–111)
Creatinine, Ser: 1.62 mg/dL — ABNORMAL HIGH (ref 0.61–1.24)
GFR calc Af Amer: 52 mL/min — ABNORMAL LOW (ref >60)
GFR calc non Af Amer: 45 mL/min — ABNORMAL LOW (ref >60)
Glucose, Bld: 113 mg/dL — ABNORMAL HIGH (ref 70–99)
Phosphorus: 4 mg/dL (ref 2.5–4.6)
Potassium: 4.6 mmol/L (ref 3.5–5.1)
Sodium: 142 mmol/L (ref 135–145)

## 2018-01-11 LAB — IRON AND TIBC
Iron: 56 ug/dL (ref 45–182)
Saturation Ratios: 20 % (ref 17.9–39.5)
TIBC: 274 ug/dL (ref 250–450)
UIBC: 218 ug/dL

## 2018-01-11 LAB — FERRITIN: Ferritin: 155 ng/mL (ref 24–336)

## 2018-01-11 LAB — HEMOGLOBIN AND HEMATOCRIT, BLOOD
HCT: 34.6 % — ABNORMAL LOW (ref 39.0–52.0)
Hemoglobin: 10.9 g/dL — ABNORMAL LOW (ref 13.0–17.0)

## 2018-01-12 LAB — PARATHYROID HORMONE, INTACT (NO CA): PTH: 32 pg/mL (ref 15–65)

## 2018-01-13 LAB — VITAMIN D 25 HYDROXY (VIT D DEFICIENCY, FRACTURES): Vit D, 25-Hydroxy: 46.7 ng/mL (ref 30.0–100.0)

## 2018-01-22 ENCOUNTER — Ambulatory Visit (HOSPITAL_COMMUNITY)
Admission: RE | Admit: 2018-01-22 | Discharge: 2018-01-22 | Disposition: A | Discharge status: Home / Self Care | Payer: BLUE CROSS/BLUE SHIELD | Source: Ambulatory Visit | Attending: Urology | Admitting: Urology

## 2018-01-22 DIAGNOSIS — Z87442 Personal history of urinary calculi: Secondary | ICD-10-CM | POA: Diagnosis not present

## 2018-01-22 DIAGNOSIS — Z09 Encounter for follow-up examination after completed treatment for conditions other than malignant neoplasm: Secondary | ICD-10-CM | POA: Insufficient documentation

## 2018-01-22 DIAGNOSIS — N200 Calculus of kidney: Secondary | ICD-10-CM

## 2018-02-03 ENCOUNTER — Ambulatory Visit: Payer: BLUE CROSS/BLUE SHIELD | Admitting: Urology

## 2018-02-03 DIAGNOSIS — N200 Calculus of kidney: Secondary | ICD-10-CM | POA: Diagnosis not present

## 2018-02-25 ENCOUNTER — Inpatient Hospital Stay (HOSPITAL_COMMUNITY): Payer: BLUE CROSS/BLUE SHIELD | Attending: Hematology

## 2018-02-25 DIAGNOSIS — C9001 Multiple myeloma in remission: Secondary | ICD-10-CM | POA: Insufficient documentation

## 2018-02-25 DIAGNOSIS — C9000 Multiple myeloma not having achieved remission: Secondary | ICD-10-CM

## 2018-02-25 LAB — CBC WITH DIFFERENTIAL/PLATELET
Basophils Absolute: 0 10*3/uL (ref 0.0–0.1)
Basophils Relative: 1 %
Eosinophils Absolute: 0.2 10*3/uL (ref 0.0–0.7)
Eosinophils Relative: 4 %
HCT: 35.8 % — ABNORMAL LOW (ref 39.0–52.0)
Hemoglobin: 11.3 g/dL — ABNORMAL LOW (ref 13.0–17.0)
Lymphocytes Relative: 42 %
Lymphs Abs: 2.2 10*3/uL (ref 0.7–4.0)
MCH: 30.1 pg (ref 26.0–34.0)
MCHC: 31.6 g/dL (ref 30.0–36.0)
MCV: 95.5 fL (ref 78.0–100.0)
Monocytes Absolute: 0.4 10*3/uL (ref 0.1–1.0)
Monocytes Relative: 7 %
Neutro Abs: 2.4 10*3/uL (ref 1.7–7.7)
Neutrophils Relative %: 46 %
Platelets: 136 10*3/uL — ABNORMAL LOW (ref 150–400)
RBC: 3.75 MIL/uL — ABNORMAL LOW (ref 4.22–5.81)
RDW: 15.2 % (ref 11.5–15.5)
WBC: 5.3 10*3/uL (ref 4.0–10.5)

## 2018-02-25 LAB — COMPREHENSIVE METABOLIC PANEL
ALT: 55 U/L — ABNORMAL HIGH (ref 0–44)
AST: 50 U/L — ABNORMAL HIGH (ref 15–41)
Albumin: 3.5 g/dL (ref 3.5–5.0)
Alkaline Phosphatase: 115 U/L (ref 38–126)
Anion gap: 7 (ref 5–15)
BUN: 36 mg/dL — ABNORMAL HIGH (ref 6–20)
CO2: 23 mmol/L (ref 22–32)
Calcium: 8.7 mg/dL — ABNORMAL LOW (ref 8.9–10.3)
Chloride: 110 mmol/L (ref 98–111)
Creatinine, Ser: 1.76 mg/dL — ABNORMAL HIGH (ref 0.61–1.24)
GFR calc Af Amer: 47 mL/min — ABNORMAL LOW (ref >60)
GFR calc non Af Amer: 41 mL/min — ABNORMAL LOW (ref >60)
Glucose, Bld: 145 mg/dL — ABNORMAL HIGH (ref 70–99)
Potassium: 4 mmol/L (ref 3.5–5.1)
Sodium: 140 mmol/L (ref 135–145)
Total Bilirubin: 0.7 mg/dL (ref 0.3–1.2)
Total Protein: 7 g/dL (ref 6.5–8.1)

## 2018-02-25 LAB — IRON AND TIBC
Iron: 84 ug/dL (ref 45–182)
Saturation Ratios: 31 % (ref 17.9–39.5)
TIBC: 272 ug/dL (ref 250–450)
UIBC: 188 ug/dL

## 2018-02-25 LAB — VITAMIN B12: Vitamin B-12: 355 pg/mL (ref 180–914)

## 2018-02-25 LAB — FERRITIN: Ferritin: 50 ng/mL (ref 24–336)

## 2018-02-26 LAB — KAPPA/LAMBDA LIGHT CHAINS
Kappa free light chain: 62.8 mg/L — ABNORMAL HIGH (ref 3.3–19.4)
Kappa, lambda light chain ratio: 1.6 (ref 0.26–1.65)
Lambda free light chains: 39.3 mg/L — ABNORMAL HIGH (ref 5.7–26.3)

## 2018-02-27 LAB — LACTATE DEHYDROGENASE, ISOENZYMES
LDH 1: 23 % (ref 17–32)
LDH 2: 29 % (ref 25–40)
LDH 3: 22 % (ref 17–27)
LDH 4: 11 % (ref 5–13)
LDH 5: 15 % (ref 4–20)
LDH Isoenzymes, Total: 206 IU/L (ref 121–224)

## 2018-03-01 LAB — PROTEIN ELECTROPHORESIS, SERUM
A/G Ratio: 1.1 (ref 0.7–1.7)
Albumin ELP: 3.3 g/dL (ref 2.9–4.4)
Alpha-1-Globulin: 0.2 g/dL (ref 0.0–0.4)
Alpha-2-Globulin: 0.7 g/dL (ref 0.4–1.0)
Beta Globulin: 0.9 g/dL (ref 0.7–1.3)
Gamma Globulin: 1.2 g/dL (ref 0.4–1.8)
Globulin, Total: 3 g/dL (ref 2.2–3.9)
Total Protein ELP: 6.3 g/dL (ref 6.0–8.5)

## 2018-03-01 LAB — IMMUNOFIXATION ELECTROPHORESIS
IgA: 205 mg/dL (ref 90–386)
IgG (Immunoglobin G), Serum: 1232 mg/dL (ref 700–1600)
IgM (Immunoglobulin M), Srm: 139 mg/dL (ref 20–172)
Total Protein ELP: 6.3 g/dL (ref 6.0–8.5)

## 2018-03-04 ENCOUNTER — Other Ambulatory Visit: Payer: Self-pay

## 2018-03-04 ENCOUNTER — Ambulatory Visit (HOSPITAL_COMMUNITY): Payer: BLUE CROSS/BLUE SHIELD | Admitting: Hematology

## 2018-03-04 ENCOUNTER — Encounter (HOSPITAL_COMMUNITY): Payer: Self-pay | Admitting: Hematology

## 2018-03-04 ENCOUNTER — Inpatient Hospital Stay (HOSPITAL_COMMUNITY): Payer: BLUE CROSS/BLUE SHIELD | Attending: Hematology | Admitting: Hematology

## 2018-03-04 VITALS — BP 130/62 | HR 65 | Temp 98.6°F | Resp 20

## 2018-03-04 DIAGNOSIS — E785 Hyperlipidemia, unspecified: Secondary | ICD-10-CM | POA: Insufficient documentation

## 2018-03-04 DIAGNOSIS — I252 Old myocardial infarction: Secondary | ICD-10-CM

## 2018-03-04 DIAGNOSIS — I872 Venous insufficiency (chronic) (peripheral): Secondary | ICD-10-CM | POA: Insufficient documentation

## 2018-03-04 DIAGNOSIS — D649 Anemia, unspecified: Secondary | ICD-10-CM

## 2018-03-04 DIAGNOSIS — E1122 Type 2 diabetes mellitus with diabetic chronic kidney disease: Secondary | ICD-10-CM

## 2018-03-04 DIAGNOSIS — Z87891 Personal history of nicotine dependence: Secondary | ICD-10-CM | POA: Insufficient documentation

## 2018-03-04 DIAGNOSIS — Z79899 Other long term (current) drug therapy: Secondary | ICD-10-CM | POA: Insufficient documentation

## 2018-03-04 DIAGNOSIS — R5383 Other fatigue: Secondary | ICD-10-CM

## 2018-03-04 DIAGNOSIS — I4892 Unspecified atrial flutter: Secondary | ICD-10-CM | POA: Insufficient documentation

## 2018-03-04 DIAGNOSIS — R531 Weakness: Secondary | ICD-10-CM | POA: Insufficient documentation

## 2018-03-04 DIAGNOSIS — Z794 Long term (current) use of insulin: Secondary | ICD-10-CM | POA: Insufficient documentation

## 2018-03-04 DIAGNOSIS — R197 Diarrhea, unspecified: Secondary | ICD-10-CM

## 2018-03-04 DIAGNOSIS — I129 Hypertensive chronic kidney disease with stage 1 through stage 4 chronic kidney disease, or unspecified chronic kidney disease: Secondary | ICD-10-CM | POA: Insufficient documentation

## 2018-03-04 DIAGNOSIS — N183 Chronic kidney disease, stage 3 (moderate): Secondary | ICD-10-CM | POA: Insufficient documentation

## 2018-03-04 DIAGNOSIS — Z7901 Long term (current) use of anticoagulants: Secondary | ICD-10-CM | POA: Insufficient documentation

## 2018-03-04 DIAGNOSIS — I2510 Atherosclerotic heart disease of native coronary artery without angina pectoris: Secondary | ICD-10-CM | POA: Insufficient documentation

## 2018-03-04 DIAGNOSIS — C9000 Multiple myeloma not having achieved remission: Secondary | ICD-10-CM | POA: Insufficient documentation

## 2018-03-04 DIAGNOSIS — D631 Anemia in chronic kidney disease: Secondary | ICD-10-CM | POA: Insufficient documentation

## 2018-03-04 DIAGNOSIS — D509 Iron deficiency anemia, unspecified: Secondary | ICD-10-CM | POA: Insufficient documentation

## 2018-03-04 DIAGNOSIS — Z23 Encounter for immunization: Secondary | ICD-10-CM | POA: Insufficient documentation

## 2018-03-04 MED ORDER — INFLUENZA VAC SPLIT QUAD 0.5 ML IM SUSY
PREFILLED_SYRINGE | INTRAMUSCULAR | Status: AC
  Filled 2018-03-04: qty 0.5

## 2018-03-04 MED ORDER — INFLUENZA VAC SPLIT QUAD 0.5 ML IM SUSY
0.5000 mL | PREFILLED_SYRINGE | Freq: Once | INTRAMUSCULAR | Status: AC
  Administered 2018-03-04: 0.5 mL via INTRAMUSCULAR

## 2018-03-04 NOTE — Progress Notes (Signed)
[name], [place] [address]   CLINIC:  Medical Oncology/Hematology  PCP:  [name], [address] [place] [address] [phone]   REASON FOR VISIT: Follow-up for multiple myeloma  CURRENT THERAPY: Observation   INTERVAL HISTORY:  [name] 57 y.o. male returns for routine follow-up for multiple myeloma. Patient is doing well and stronger than his last visit. His appetite is 100% and his energy level was 75%. He deneis any weight loss, night sweats, fevers, or chills. Denies any new pains. Denies any nausea, vomiting, or diarrhea. Denies any bleeding.     REVIEW OF SYSTEMS:  Review of Systems  Constitutional: Positive for fatigue.  Gastrointestinal: Positive for diarrhea.  Neurological: Positive for extremity weakness.  Hematological: Bruises/bleeds easily.  All other systems reviewed and are negative.    PAST MEDICAL/SURGICAL HISTORY:  Past Medical History:  Diagnosis Date  . Abscess 04/2017   Scrotal  . Anemia   . Arteriosclerotic cardiovascular disease (ASCVD)    MI-2000s; stent to the proximal LAD and diagonal in 2001; stress nuclear in 2008-impaired exercise capacity, left ventricular dilatation, moderately to severely depressed EF, apical, inferior and anteroseptal scar  . Arthritis   . Atrial flutter ([facility])   . 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 Cellulitis of leg    both legs  . Chronic diarrhea   . Chronic kidney disease, stage 3, mod decreased GFR (HCC)    Creatinine of 1.84 in 06/2011 and 1.5 in 07/2011  . Diabetes mellitus    Insulin  . Dysrhythmia    AFlutter  . GERD (gastroesophageal reflux disease)   . Gout   . Hyperlipidemia   . Hypertension   . Injection site reaction   . Multiple myeloma 07/01/2011  . Myocardial infarction ([facility]) 2000  . Obesity   . Pedal edema    Venous insufficiency  . Sleep apnea    uses cpap  . Ulcer    Past Surgical History:  Procedure Laterality Date  .  ABSCESS DRAINAGE     Scrotal  . BIOPSY  01/02/2012   Procedure: BIOPSY;  Surgeon: Rogene Houston, MD;  [facility];  Service: Endoscopy;  Laterality: N/A;  . BONE MARROW BIOPSY  05/13/11  . CARDIAC CATHETERIZATION     cardiac stent  . CARDIOVERSION N/A 10/13/2012   Procedure: CARDIOVERSION;  Surgeon: Yehuda Savannah, MD;  Location: [facility];  Service: Cardiovascular;  Laterality: N/A;  . CATARACT EXTRACTION W/PHACO Left 02/13/2014   Procedure: CATARACT EXTRACTION PHACO AND INTRAOCULAR LENS PLACEMENT ([place]);  Surgeon: Tonny Branch, MD;  Location: [facility];  Service: Ophthalmology;  Laterality: Left;  CDE:  7.67  . CATARACT EXTRACTION W/PHACO Right 03/02/2014   Procedure: CATARACT EXTRACTION PHACO AND INTRAOCULAR LENS PLACEMENT RIGHT EYE CDE=16.81;  Surgeon: Tonny Branch, MD;  Location: [facility];  Service: Ophthalmology;  Laterality: Right;  . COLONOSCOPY  11/28/2011   Procedure: COLONOSCOPY;  Surgeon: Rogene Houston, MD;  [facility];  Service: Endoscopy;  Laterality: N/A;  930  . CYSTOSCOPY W/ RETROGRADES Right 12/16/2017   Procedure: CYSTOSCOPY WITH RIGHT RETROGRADE PYELOGRAM AND RIGHT URETERAL STENT REMOVAL;  Surgeon: Cleon Gustin, MD;  Location: [facility];  Service: Urology;  Laterality: Right;  . CYSTOSCOPY W/ URETERAL STENT PLACEMENT Right 07/31/2017   Procedure: CYSTOSCOPY WITH RETROGRADE PYELOGRAM/URETERAL STENT PLACEMENT;  Surgeon: Cleon Gustin, MD;  [facility];  Service: Urology;  Laterality: Right;  . ESOPHAGOGASTRODUODENOSCOPY  01/02/2012  Procedure: ESOPHAGOGASTRODUODENOSCOPY (EGD);  Surgeon: Rogene Houston, MD;  [facility];  Service: Endoscopy;  Laterality: N/A;  100  . ESOPHAGOGASTRODUODENOSCOPY N/A 09/20/2012   Procedure: ESOPHAGOGASTRODUODENOSCOPY (EGD);  Surgeon: Rogene Houston, MD;  [facility];  Service: Endoscopy;  Laterality: N/A;  . EUS N/A 10/07/2012   Procedure: UPPER ENDOSCOPIC ULTRASOUND (EUS) LINEAR;  Surgeon: Milus Banister, MD;  [facility];  Service: Endoscopy;  Laterality: N/A;  . INCISION AND DRAINAGE ABSCESS N/A 04/14/2017   Procedure: INCISION AND DRAINAGE ABSCESS;  Surgeon: Ceasar Mons, MD;  [facility];  Service: Urology;  Laterality: N/A;  . INCISION AND DRAINAGE ABSCESS ANAL    . IRRIGATION AND DEBRIDEMENT ABSCESS N/A 04/15/2017   Procedure: DEBRIDEMENT SCROTAL WOUND AND DRESSING CHANGE;  Surgeon: Ceasar Mons, MD;  [facility];  Service: Urology;  Laterality: N/A;  . IRRIGATION AND DEBRIDEMENT ABSCESS N/A 04/17/2017   Procedure: IRRIGATION AND DEBRIDEMENT ABSCESS;  Surgeon: Ceasar Mons, MD;  [facility];  Service: Urology;  Laterality: N/A;  RM 3  . LAPAROSCOPIC GASTRIC BANDING  2006   has been removed  . OSTECTOMY Right 04/08/2017   Procedure: OSTECTOMY RIGHT GREAT TOE;  Surgeon: Caprice Beaver, DPM;  Location: [facility];  Service: Podiatry;  Laterality: Right;  . PORT-A-CATH REMOVAL Left 12/07/2012   Procedure: REMOVAL PORT-A-CATH;  Surgeon: Scherry Ran, MD;  Location: [facility];  Service: General;  Laterality: Left;  . PORT-A-CATH REMOVAL N/A 10/02/2017   Procedure: MINOR REMOVAL PORT-A-CATH AT BEDSIDE;  Surgeon: Donnie Mesa, MD;  Location: [facility];  Service: General;  Laterality: N/A;  . PORTACATH PLACEMENT  07/07/2011   Procedure: INSERTION PORT-A-CATH;  Surgeon: Scherry Ran, MD;  [facility];  Service: General;  Laterality: N/A;  . PORTACATH PLACEMENT N/A 12/07/2012   Procedure: INSERTION PORT-A-CATH;  Surgeon: Scherry Ran, MD;  Location: [facility];  Service: General;  Laterality: N/A;  Attempted portacath placement on left and right side  . URETEROSCOPY Right 12/16/2017   Procedure: DIAGNOSTIC RIGHT URETEROSCOPY;  Surgeon: Cleon Gustin, MD;  [facility];  Service: Urology;  Laterality: Right;  . WOUND DEBRIDEMENT Right 04/08/2017   Procedure: EXCISION ULCERATION RIGHT GREAT TOE;  Surgeon: Caprice Beaver, DPM;  Location[facility];  Service: Podiatry;  Laterality: Right;  . WRIST SURGERY     Left; removal of bone fragment     SOCIAL HISTORY:  Social History   Socioeconomic History  . Marital status: Married    Spouse name: Not on file  . Number of children: Not on file  . Years of education: 49  . Highest education level: Not on file  Occupational History  . Occupation: Nurse, learning disability  . Occupation: Optometrist: [employer]  Social Needs  . Financial resource strain: Not on file  . Food insecurity:    Worry: Not on file    Inability: Not on file  . Transportation needs:    Medical: Not on file    Non-medical: Not on file  Tobacco Use  . Smoking status: Former Smoker    Packs/day: 0.25    Years: 1.00    Pack years: 0.25    Types: Cigarettes, Cigars    Last attempt to quit: 05/17/2001    Years since quitting: 16.8  . Smokeless tobacco: Never Used  Substance and Sexual Activity  . Alcohol use: No    Alcohol/week: 0.0 standard drinks  .  Drug use: No  . Sexual activity: Yes    Birth control/protection: None  Lifestyle  . Physical activity:    Days per week: Not on file    Minutes per session: Not on file  . Stress: Not on file  Relationships  . Social connections:    Talks on phone: Not on file    Gets together: Not on file    Attends religious service: Not on file    Active member of club or organization: Not on file    Attends meetings of clubs or organizations: Not on file    Relationship status: Not on file  . Intimate partner violence:    Fear of current or ex partner: Not on file    Emotionally abused: Not on file    Physically abused: Not on file    Forced sexual activity: Not on file  Other Topics Concern  . Not on file  Social History Narrative  . Not on file    FAMILY HISTORY:  Family History  Problem Relation Age of Onset  . Heart disease Mother   . Cancer Mother   . Diabetes Father   . Arthritis Unknown    . Anesthesia problems Neg Hx   . Hypotension Neg Hx   . Malignant hyperthermia Neg Hx   . Pseudochol deficiency Neg Hx     CURRENT MEDICATIONS:  Outpatient Encounter Medications as of 03/04/2018  Medication Sig Note  . acetaminophen (TYLENOL) 500 MG tablet Take 500-1,000 mg by mouth every 6 (six) hours as needed for mild pain or moderate pain.   Marland Kitchen acyclovir (ZOVIRAX) 400 MG tablet Take 1 tablet (400 mg total) by mouth every morning.   Marland Kitchen allopurinol (ZYLOPRIM) 300 MG tablet TAKE ONE TABLET BY MOUTH ONCE DAILY.   Marland Kitchen Artificial Tear Ointment (DRY EYES OP) Place 1 drop into both eyes as needed (dry eyes).   Marland Kitchen atorvastatin (LIPITOR) 80 MG tablet Take 1 tablet (80 mg total) by mouth at bedtime.   . calcitRIOL (ROCALTROL) 0.25 MCG capsule Take 0.25 mcg by mouth every Monday.  12/17/2017: Last dose Monday 7/15  . Calcium Carbonate (CALCIUM 600 PO) Take 1 tablet by mouth 2 (two) times daily.   . clotrimazole (LOTRIMIN) 1 % cream Apply 1 application topically 2 (two) times daily.   Marland Kitchen dexlansoprazole (DEXILANT) 60 MG capsule Take 60 mg by mouth every morning.   . dicyclomine (BENTYL) 20 MG tablet TAKE 1 TABLET BY MOUTH BEFORE MEALS 3 TIMES DAILY.   Marland Kitchen Eluxadoline (VIBERZI) 100 MG TABS Take 100 mg 2 (two) times daily with a meal by mouth.    . gabapentin (NEURONTIN) 300 MG capsule Take 1 capsule (300 mg total) by mouth 2 (two) times daily.   . insulin detemir (LEVEMIR) 100 UNIT/ML injection Inject 0.1 mLs (10 Units total) into the skin every morning. 12/17/2017: Had 5 units this AM due to surgery  . insulin lispro (HUMALOG) 100 UNIT/ML injection Inject 0-0.1 mLs (0-10 Units total) into the skin 2 (two) times daily with a meal. Sliding Scale per patient  CBG 150-200: 1 units CBG 201-275:  3 units CBG 275-350: 5 units 351-425: 7 units 425-500: 9 units 500+: 10 units   . loratadine (CLARITIN) 10 MG tablet Take 10 mg by mouth every morning.    . Magnesium Cl-Calcium Carbonate (SLOW-MAG PO) Take 1 tablet  by mouth every morning.   . Multiple Vitamins-Minerals (MULTIVITAMINS THER. W/MINERALS) TABS Take 1 tablet by mouth daily.    . nitroGLYCERIN (  NITROSTAT) 0.4 MG SL tablet DISSOLVE 1 TABLET UNDER TONGUE EVERY 5 MINUTES UP TO 15 MIN FOR CHESTPAIN. IF NO RELIEF CALL 911.   Marland Kitchen oxyCODONE-acetaminophen (PERCOCET) 5-325 MG tablet Take 1 tablet by mouth every 4 (four) hours as needed for severe pain.   . potassium chloride SA (K-DUR,KLOR-CON) 20 MEQ tablet Take 1 tablet (20 mEq total) by mouth daily. (Patient taking differently: Take 20 mEq by mouth 2 (two) times daily. )   . torsemide (DEMADEX) 20 MG tablet Take 1 tablet (20 mg total) by mouth 2 (two) times daily. Take extra 1 tab for weight gain of 3 lbs in 1 day or 5 lbs in 2 days   . Vitamin D, Ergocalciferol, (DRISDOL) 50000 units CAPS capsule TAKE 1 CAPSULE BY MOUTH EVERY THIRTY DAYS. 12/17/2017: Last dose 07/01  . XARELTO 20 MG TABS tablet TAKE 1 TABLET BY MOUTH DAILY.   . [DISCONTINUED] dexamethasone (DECADRON) 4 MG tablet Take by mouth.   . [DISCONTINUED] lenalidomide ([name]) 5 MG capsule Take by mouth.    Facility-Administered Encounter Medications as of 03/04/2018  Medication  . [COMPLETED] Influenza vac split quadrivalent PF (FLUARIX) injection 0.5 mL  . sodium chloride 0.9 % injection 10 mL    ALLERGIES:  Allergies  Allergen Reactions  . Beef-Derived Products Diarrhea  . Pork-Derived Products Diarrhea     PHYSICAL EXAM:  ECOG Performance status: 1  Vitals:   03/04/18 1511 03/04/18 1533  BP: 130/62 130/62  Pulse: 65 65  Resp: 18 20  Temp: 98.6 F (37 C) 98.6 F (37 C)  SpO2: 99% 99%   There were no vitals filed for this visit.  Physical Exam  Constitutional: He is oriented to person, place, and time. He appears well-developed and well-nourished.  Cardiovascular: Normal rate, regular rhythm and normal heart sounds.  Pulmonary/Chest: Effort normal and breath sounds normal.  Musculoskeletal: Normal range of motion.    Neurological: He is alert and oriented to person, place, and time.  Skin: Skin is warm and dry.  Psychiatric: He has a normal mood and affect. His behavior is normal. Judgment and thought content normal.  Patient has difficulty gripping his right hand.   LABORATORY DATA:  I have reviewed the labs as listed.  CBC    Component Value Date/Time   WBC 5.3 02/25/2018 1552   RBC 3.75 (L) 02/25/2018 1552   HGB 11.3 (L) 02/25/2018 1552   HCT 35.8 (L) 02/25/2018 1552   PLT 136 (L) 02/25/2018 1552   MCV 95.5 02/25/2018 1552   MCH 30.1 02/25/2018 1552   MCHC 31.6 02/25/2018 1552   RDW 15.2 02/25/2018 1552   LYMPHSABS 2.2 02/25/2018 1552   MONOABS 0.4 02/25/2018 1552   EOSABS 0.2 02/25/2018 1552   BASOSABS 0.0 02/25/2018 1552   CMP Latest Ref Rng & Units 02/25/2018 01/11/2018 12/21/2017  Glucose 70 - 99 mg/dL 145(H) 113(H) 108(H)  BUN 6 - 20 mg/dL 36(H) 38(H) 25(H)  Creatinine 0.61 - 1.24 mg/dL 1.76(H) 1.62(H) 1.49(H)  Sodium 135 - 145 mmol/L 140 142 142  Potassium 3.5 - 5.1 mmol/L 4.0 4.6 3.8  Chloride 98 - 111 mmol/L 110 111 116(H)  CO2 22 - 32 mmol/L 23 26 20(L)  Calcium 8.9 - 10.3 mg/dL 8.7(L) 8.9 8.4(L)  Total Protein 6.5 - 8.1 g/dL 7.0 - -  Total Bilirubin 0.3 - 1.2 mg/dL 0.7 - -  Alkaline Phos 38 - 126 U/L 115 - -  AST 15 - 41 U/L 50(H) - -  ALT 0 -  44 U/L 55(H) - -          ASSESSMENT & PLAN:   Multiple myeloma ([place]) 1.  Multiple myeloma: - Maintenance [name] and dexamethasone until November 2018, which was discontinued secondary to recurrent infections culminating and development of Fournier's gangrene in November 2018.  Prior to that all his myeloma work-up was negative.  He also had a bone marrow biopsy which was negative at that time. - He denies any fevers or infections in the last 3 months.  No hospitalizations noted. -We discussed the results of myeloma work-up dated 02/25/2018 which shows negative SPEP, negative immunofixation.  Free light chain ratio is 1.6  and stable.  Calcium is 8.7 and creatinine is 1.76 and stable.  Hemoglobin was 11.3. -I will see him back in 4 months with repeat myeloma panel.   2.  Normocytic anemia: -This is from underlying chronic kidney disease and relative iron deficiency. - His percent saturation was 31 and ferritin was 50.  He received Feraheme infusion on 12/08/2017 and 12/14/2017.  He was asked to start taking iron tablet daily along with stool softener.      Orders placed this encounter:  Orders Placed This Encounter  Procedures  . Lactate dehydrogenase  . Protein electrophoresis, serum  . Immunofixation electrophoresis  . Kappa/lambda light chains  . CBC with Differential/Platelet  . Comprehensive metabolic panel  . Ferritin  . Iron and TIBC  . Folate  . Vitamin B12      Derek Jack, [place] [phone]

## 2018-03-04 NOTE — Patient Instructions (Signed)
[facility] at [facility] Discharge Instructions     Thank you for choosing [facility] at [facility] to provide your oncology and hematology care.  To afford each patient quality time with our provider, please arrive at least 15 minutes before your scheduled appointment time.   If you have a lab appointment with the Cancer Center please come in thru the  Main Entrance and check in at the main information desk  You need to re-schedule your appointment should you arrive 10 or more minutes late.  We strive to give you quality time with our providers, and arriving late affects you and other patients whose appointments are after yours.  Also, if you no show three or more times for appointments you may be dismissed from the clinic at the providers discretion.     Again, thank you for choosing [facility].  Our hope is that these requests will decrease the amount of time that you wait before being seen by our physicians.       _____________________________________________________________  Should you have questions after your visit to [facility], please contact our office at [phone] between the hours of 8:00 a.m. and 4:30 p.m.  Voicemails left after 4:00 p.m. will not be returned until the following business day.  For prescription refill requests, have your pharmacy contact our office and allow 72 hours.    Cancer Center Support Programs:   > Cancer Support Group  2nd Tuesday of the month 1pm-2pm, Journey Room    

## 2018-03-04 NOTE — Assessment & Plan Note (Signed)
1.  Multiple myeloma: - Maintenance [name] and dexamethasone until November 2018, which was discontinued secondary to recurrent infections culminating and development of Fournier's gangrene in November 2018.  Prior to that all his myeloma work-up was negative.  He also had a bone marrow biopsy which was negative at that time. - He denies any fevers or infections in the last 3 months.  No hospitalizations noted. -We discussed the results of myeloma work-up dated 02/25/2018 which shows negative SPEP, negative immunofixation.  Free light chain ratio is 1.6 and stable.  Calcium is 8.7 and creatinine is 1.76 and stable.  Hemoglobin was 11.3. -I will see him back in 4 months with repeat myeloma panel.   2.  Normocytic anemia: -This is from underlying chronic kidney disease and relative iron deficiency. - His percent saturation was 31 and ferritin was 50.  He received Feraheme infusion on 12/08/2017 and 12/14/2017.  He was asked to start taking iron tablet daily along with stool softener.

## 2018-04-17 ENCOUNTER — Other Ambulatory Visit (INDEPENDENT_AMBULATORY_CARE_PROVIDER_SITE_OTHER): Payer: Self-pay | Admitting: Internal Medicine

## 2018-04-19 NOTE — Telephone Encounter (Signed)
Patient will need to have appointment with our OV , prior to further refills, He has not been seen here since 10/21/2016.,per Dr.Rehman.

## 2018-04-28 ENCOUNTER — Other Ambulatory Visit (HOSPITAL_COMMUNITY): Payer: Self-pay | Admitting: *Deleted

## 2018-04-28 DIAGNOSIS — N183 Chronic kidney disease, stage 3 unspecified: Secondary | ICD-10-CM

## 2018-07-05 ENCOUNTER — Inpatient Hospital Stay (HOSPITAL_COMMUNITY): Payer: BLUE CROSS/BLUE SHIELD | Attending: Hematology

## 2018-07-05 DIAGNOSIS — Z794 Long term (current) use of insulin: Secondary | ICD-10-CM | POA: Diagnosis not present

## 2018-07-05 DIAGNOSIS — C9000 Multiple myeloma not having achieved remission: Secondary | ICD-10-CM

## 2018-07-05 DIAGNOSIS — Z79899 Other long term (current) drug therapy: Secondary | ICD-10-CM | POA: Diagnosis not present

## 2018-07-05 DIAGNOSIS — Z87891 Personal history of nicotine dependence: Secondary | ICD-10-CM | POA: Insufficient documentation

## 2018-07-05 DIAGNOSIS — N183 Chronic kidney disease, stage 3 unspecified: Secondary | ICD-10-CM

## 2018-07-05 DIAGNOSIS — I10 Essential (primary) hypertension: Secondary | ICD-10-CM | POA: Diagnosis not present

## 2018-07-05 DIAGNOSIS — D6489 Other specified anemias: Secondary | ICD-10-CM | POA: Diagnosis not present

## 2018-07-05 DIAGNOSIS — E119 Type 2 diabetes mellitus without complications: Secondary | ICD-10-CM | POA: Insufficient documentation

## 2018-07-05 DIAGNOSIS — D649 Anemia, unspecified: Secondary | ICD-10-CM

## 2018-07-05 DIAGNOSIS — N189 Chronic kidney disease, unspecified: Secondary | ICD-10-CM | POA: Diagnosis not present

## 2018-07-05 LAB — VITAMIN B12: Vitamin B-12: 534 pg/mL (ref 180–914)

## 2018-07-05 LAB — RENAL FUNCTION PANEL
Albumin: 3.1 g/dL — ABNORMAL LOW (ref 3.5–5.0)
Anion gap: 9 (ref 5–15)
BUN: 44 mg/dL — ABNORMAL HIGH (ref 6–20)
CO2: 23 mmol/L (ref 22–32)
Calcium: 8.4 mg/dL — ABNORMAL LOW (ref 8.9–10.3)
Chloride: 108 mmol/L (ref 98–111)
Creatinine, Ser: 1.99 mg/dL — ABNORMAL HIGH (ref 0.61–1.24)
GFR calc Af Amer: 41 mL/min — ABNORMAL LOW (ref >60)
GFR calc non Af Amer: 36 mL/min — ABNORMAL LOW (ref >60)
Glucose, Bld: 138 mg/dL — ABNORMAL HIGH (ref 70–99)
Phosphorus: 4.4 mg/dL (ref 2.5–4.6)
Potassium: 3.9 mmol/L (ref 3.5–5.1)
Sodium: 140 mmol/L (ref 135–145)

## 2018-07-05 LAB — CBC WITH DIFFERENTIAL/PLATELET
Abs Immature Granulocytes: 0.01 10*3/uL (ref 0.00–0.07)
Basophils Absolute: 0 10*3/uL (ref 0.0–0.1)
Basophils Relative: 1 %
Eosinophils Absolute: 0.2 10*3/uL (ref 0.0–0.5)
Eosinophils Relative: 3 %
HCT: 38 % — ABNORMAL LOW (ref 39.0–52.0)
Hemoglobin: 11.8 g/dL — ABNORMAL LOW (ref 13.0–17.0)
Immature Granulocytes: 0 %
Lymphocytes Relative: 36 %
Lymphs Abs: 2 10*3/uL (ref 0.7–4.0)
MCH: 30.2 pg (ref 26.0–34.0)
MCHC: 31.1 g/dL (ref 30.0–36.0)
MCV: 97.2 fL (ref 80.0–100.0)
Monocytes Absolute: 0.5 10*3/uL (ref 0.1–1.0)
Monocytes Relative: 9 %
Neutro Abs: 2.8 10*3/uL (ref 1.7–7.7)
Neutrophils Relative %: 51 %
Platelets: 157 10*3/uL (ref 150–400)
RBC: 3.91 MIL/uL — ABNORMAL LOW (ref 4.22–5.81)
RDW: 14 % (ref 11.5–15.5)
WBC: 5.4 10*3/uL (ref 4.0–10.5)
nRBC: 0 % (ref 0.0–0.2)

## 2018-07-05 LAB — COMPREHENSIVE METABOLIC PANEL
ALT: 47 U/L — ABNORMAL HIGH (ref 0–44)
AST: 38 U/L (ref 15–41)
Albumin: 3.2 g/dL — ABNORMAL LOW (ref 3.5–5.0)
Alkaline Phosphatase: 109 U/L (ref 38–126)
Anion gap: 7 (ref 5–15)
BUN: 45 mg/dL — ABNORMAL HIGH (ref 6–20)
CO2: 25 mmol/L (ref 22–32)
Calcium: 8.6 mg/dL — ABNORMAL LOW (ref 8.9–10.3)
Chloride: 110 mmol/L (ref 98–111)
Creatinine, Ser: 2 mg/dL — ABNORMAL HIGH (ref 0.61–1.24)
GFR calc Af Amer: 41 mL/min — ABNORMAL LOW (ref >60)
GFR calc non Af Amer: 35 mL/min — ABNORMAL LOW (ref >60)
Glucose, Bld: 136 mg/dL — ABNORMAL HIGH (ref 70–99)
Potassium: 3.9 mmol/L (ref 3.5–5.1)
Sodium: 142 mmol/L (ref 135–145)
Total Bilirubin: 0.5 mg/dL (ref 0.3–1.2)
Total Protein: 6.5 g/dL (ref 6.5–8.1)

## 2018-07-05 LAB — IRON AND TIBC
Iron: 97 ug/dL (ref 45–182)
Saturation Ratios: 32 % (ref 17.9–39.5)
TIBC: 302 ug/dL (ref 250–450)
UIBC: 205 ug/dL

## 2018-07-05 LAB — FERRITIN: Ferritin: 62 ng/mL (ref 24–336)

## 2018-07-05 LAB — FOLATE: Folate: 20.8 ng/mL (ref >5.9)

## 2018-07-05 LAB — LACTATE DEHYDROGENASE: LDH: 203 U/L — ABNORMAL HIGH (ref 98–192)

## 2018-07-07 LAB — PROTEIN ELECTROPHORESIS, SERUM
A/G Ratio: 1.1 (ref 0.7–1.7)
Albumin ELP: 3.1 g/dL (ref 2.9–4.4)
Alpha-1-Globulin: 0.2 g/dL (ref 0.0–0.4)
Alpha-2-Globulin: 0.8 g/dL (ref 0.4–1.0)
Beta Globulin: 1 g/dL (ref 0.7–1.3)
Gamma Globulin: 1 g/dL (ref 0.4–1.8)
Globulin, Total: 2.9 g/dL (ref 2.2–3.9)
Total Protein ELP: 6 g/dL (ref 6.0–8.5)

## 2018-07-07 LAB — KAPPA/LAMBDA LIGHT CHAINS
Kappa free light chain: 67.3 mg/L — ABNORMAL HIGH (ref 3.3–19.4)
Kappa, lambda light chain ratio: 1.61 (ref 0.26–1.65)
Lambda free light chains: 41.8 mg/L — ABNORMAL HIGH (ref 5.7–26.3)

## 2018-07-08 LAB — IMMUNOFIXATION ELECTROPHORESIS
IgA: 210 mg/dL (ref 90–386)
IgG (Immunoglobin G), Serum: 1039 mg/dL (ref 700–1600)
IgM (Immunoglobulin M), Srm: 194 mg/dL — ABNORMAL HIGH (ref 20–172)
Total Protein ELP: 6 g/dL (ref 6.0–8.5)

## 2018-07-09 ENCOUNTER — Encounter (HOSPITAL_COMMUNITY): Payer: Self-pay | Admitting: Hematology

## 2018-07-09 ENCOUNTER — Other Ambulatory Visit: Payer: Self-pay

## 2018-07-09 ENCOUNTER — Inpatient Hospital Stay (HOSPITAL_COMMUNITY): Payer: BLUE CROSS/BLUE SHIELD | Admitting: Hematology

## 2018-07-09 VITALS — BP 149/69 | HR 88 | Temp 98.9°F | Resp 18 | Wt 374.5 lb

## 2018-07-09 DIAGNOSIS — N189 Chronic kidney disease, unspecified: Secondary | ICD-10-CM | POA: Diagnosis not present

## 2018-07-09 DIAGNOSIS — Z87891 Personal history of nicotine dependence: Secondary | ICD-10-CM | POA: Diagnosis not present

## 2018-07-09 DIAGNOSIS — E119 Type 2 diabetes mellitus without complications: Secondary | ICD-10-CM

## 2018-07-09 DIAGNOSIS — D6489 Other specified anemias: Secondary | ICD-10-CM | POA: Diagnosis not present

## 2018-07-09 DIAGNOSIS — I10 Essential (primary) hypertension: Secondary | ICD-10-CM

## 2018-07-09 DIAGNOSIS — Z79899 Other long term (current) drug therapy: Secondary | ICD-10-CM

## 2018-07-09 DIAGNOSIS — Z794 Long term (current) use of insulin: Secondary | ICD-10-CM

## 2018-07-09 DIAGNOSIS — C9000 Multiple myeloma not having achieved remission: Secondary | ICD-10-CM

## 2018-07-09 MED ORDER — GABAPENTIN 600 MG PO TABS: 600.0000 mg | ORAL_TABLET | Freq: Every day | ORAL | 3 refills | Status: DC

## 2018-07-09 NOTE — Assessment & Plan Note (Signed)
1.  Multiple myeloma: - Maintenance [name] and dexamethasone until November 2018, which was discontinued secondary to recurrent infections culminating and development of Fournier's gangrene in November 2018.  Prior to that all his myeloma work-up was negative.  He also had a bone marrow biopsy which was negative at that time. - He denies any fevers or infections in the last 3 months.  No hospitalizations noted. -We discussed myeloma work-up from 07/05/2018 which shows negative SPEP, negative immunofixation.  Free light chain ratio was normal around 1.6.  Kappa free light chains are also stable around 67.  Creatinine is slightly elevated at 2.  This could be from torsemide. -Calcium was normal. - Physical exam shows small lymph node in the nape of the right neck.  We will keep an eye on it.  He apparently wears CPAP at nighttime.  The band is rubbing on his right posterior scalp.  This could be reactive from that. -We will see him back in 3 months for follow-up.  We will repeat labs prior to his next visit.  2.  Normocytic anemia: -This is from underlying CKD and relative iron deficiency. -He is taking iron tablet every other day and is tolerating it very well.  He last received Feraheme on 12/08/2017 and 12/14/2017. -His ferritin is mildly improved from last time.  He will continue iron every other day.

## 2018-07-09 NOTE — Progress Notes (Signed)
[name], [place] [address]   CLINIC:  Medical Oncology/Hematology  PCP:  [name], [address] [place] [address] [phone]   REASON FOR VISIT: Follow-up for multiple myeloma  CURRENT THERAPY: Observation   INTERVAL HISTORY:  [name] 58 y.o. male returns for routine follow-up for multiple myeloma. He is here today with his wife. He has been doing well. He reports he has had some congestion mostly at night time when he lays down at night. He occasionally has muscle aches and pains. He is fatigued throughout the day and he is unable to do many activities. He reports he started having chills and a runny nose this morning. Denies any new cough. Denies any nausea, vomiting, or diarrhea. Denies any new pains. Had not noticed any recent bleeding such as epistaxis, hematuria or hematochezia. Denies recent chest pain on exertion, shortness of breath on minimal exertion, pre-syncopal episodes, or palpitations. Denies any numbness or tingling in hands or feet. Denies any recent fevers, infections, or recent hospitalizations. Patient reports appetite at 100% and Franklin level at 60%.   REVIEW OF SYSTEMS:  Review of Systems  Constitutional: Positive for fatigue.  Respiratory: Positive for shortness of breath.   Cardiovascular: Positive for leg swelling.  Gastrointestinal: Positive for diarrhea.  Neurological: Positive for extremity weakness and numbness.  All other systems reviewed and are negative.    PAST MEDICAL/SURGICAL HISTORY:  Past Medical History:  Diagnosis Date  . Abscess 04/2017   Scrotal  . Anemia   . Arteriosclerotic cardiovascular disease (ASCVD)    MI-2000s; stent to the proximal LAD and diagonal in 2001; stress nuclear in 2008-impaired exercise capacity, left ventricular dilatation, moderately to severely depressed EF, apical, inferior and anteroseptal scar  . Arthritis   . Atrial flutter ([place])   .  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 Cellulitis of leg    both legs  . Chronic diarrhea   . Chronic kidney disease, stage 3, mod decreased GFR (HCC)    Creatinine of 1.84 in 06/2011 and 1.5 in 07/2011  . Diabetes mellitus    Insulin  . Dysrhythmia    AFlutter  . GERD (gastroesophageal reflux disease)   . Gout   . Hyperlipidemia   . Hypertension   . Injection site reaction   . Multiple myeloma 07/01/2011  . Myocardial infarction ([place]) 2000  . Obesity   . Pedal edema    Venous insufficiency  . Sleep apnea    uses cpap  . Ulcer    Past Surgical History:  Procedure Laterality Date  . ABSCESS DRAINAGE     Scrotal  . BIOPSY  01/02/2012   Procedure: BIOPSY;  Surgeon: Rogene Houston, MD;  [facility];  Service: Endoscopy;  Laterality: N/A;  . BONE MARROW BIOPSY  05/13/11  . CARDIAC CATHETERIZATION     cardiac stent  . CARDIOVERSION N/A 10/13/2012   Procedure: CARDIOVERSION;  Surgeon: Yehuda Savannah, MD;  Location: [facility];  Service: Cardiovascular;  Laterality: N/A;  . CATARACT EXTRACTION W/PHACO Left 02/13/2014   Procedure: CATARACT EXTRACTION PHACO AND INTRAOCULAR LENS PLACEMENT ([place]);  Surgeon: Tonny Branch, MD;  Location: [facility];  Service: Ophthalmology;  Laterality: Left;  CDE:  7.67  . CATARACT EXTRACTION W/PHACO Right 03/02/2014   Procedure: CATARACT EXTRACTION PHACO AND INTRAOCULAR LENS PLACEMENT RIGHT EYE CDE=16.81;  Surgeon: Tonny Branch, MD;  Location: [facility];  Service: Ophthalmology;  Laterality: Right;  . COLONOSCOPY  11/28/2011  Procedure: COLONOSCOPY;  Surgeon: Rogene Houston, MD;  [facility];  Service: Endoscopy;  Laterality: N/A;  930  . CYSTOSCOPY W/ RETROGRADES Right 12/16/2017   Procedure: CYSTOSCOPY WITH RIGHT RETROGRADE PYELOGRAM AND RIGHT URETERAL STENT REMOVAL;  Surgeon: Cleon Gustin, MD;  Location: [facility];  Service: Urology;  Laterality: Right;  . CYSTOSCOPY W/ URETERAL STENT PLACEMENT Right 07/31/2017   Procedure: CYSTOSCOPY WITH  RETROGRADE PYELOGRAM/URETERAL STENT PLACEMENT;  Surgeon: Cleon Gustin, MD;  [facility]: [facility];  Service: Urology;  Laterality: Right;  . ESOPHAGOGASTRODUODENOSCOPY  01/02/2012   Procedure: ESOPHAGOGASTRODUODENOSCOPY (EGD);  Surgeon: Rogene Houston, MD;  [facility];  Service: Endoscopy;  Laterality: N/A;  100  . ESOPHAGOGASTRODUODENOSCOPY N/A 09/20/2012   Procedure: ESOPHAGOGASTRODUODENOSCOPY (EGD);  Surgeon: Rogene Houston, MD;  [facility];  Service: Endoscopy;  Laterality: N/A;  . EUS N/A 10/07/2012   Procedure: UPPER ENDOSCOPIC ULTRASOUND (EUS) LINEAR;  Surgeon: Milus Banister, MD;  [facility];  Service: Endoscopy;  Laterality: N/A;  . INCISION AND DRAINAGE ABSCESS N/A 04/14/2017   Procedure: INCISION AND DRAINAGE ABSCESS;  Surgeon: Ceasar Mons, MD;  [facility];  Service: Urology;  Laterality: N/A;  . INCISION AND DRAINAGE ABSCESS ANAL    . IRRIGATION AND DEBRIDEMENT ABSCESS N/A 04/15/2017   Procedure: DEBRIDEMENT SCROTAL WOUND AND DRESSING CHANGE;  Surgeon: Ceasar Mons, MD;  [facility];  Service: Urology;  Laterality: N/A;  . IRRIGATION AND DEBRIDEMENT ABSCESS N/A 04/17/2017   Procedure: IRRIGATION AND DEBRIDEMENT ABSCESS;  Surgeon: Ceasar Mons, MD;  [facility];  Service: Urology;  Laterality: N/A;  RM 3  . LAPAROSCOPIC GASTRIC BANDING  2006   has been removed  . OSTECTOMY Right 04/08/2017   Procedure: OSTECTOMY RIGHT GREAT TOE;  Surgeon: Caprice Beaver, DPM;  Location: [facility];  Service: Podiatry;  Laterality: Right;  . PORT-A-CATH REMOVAL Left 12/07/2012   Procedure: REMOVAL PORT-A-CATH;  Surgeon: Scherry Ran, MD;  Location: [facility];  Service: General;  Laterality: Left;  . PORT-A-CATH REMOVAL N/A 10/02/2017   Procedure: MINOR REMOVAL PORT-A-CATH AT BEDSIDE;  Surgeon: Donnie Mesa, MD;  Location: [facility];  Service: General;  Laterality: N/A;  . PORTACATH PLACEMENT  07/07/2011    Procedure: INSERTION PORT-A-CATH;  Surgeon: Scherry Ran, MD;  [facility]: [facility];  Service: General;  Laterality: N/A;  . PORTACATH PLACEMENT N/A 12/07/2012   Procedure: INSERTION PORT-A-CATH;  Surgeon: Scherry Ran, MD;  Location: [facility];  Service: General;  Laterality: N/A;  Attempted portacath placement on left and right side  . URETEROSCOPY Right 12/16/2017   Procedure: DIAGNOSTIC RIGHT URETEROSCOPY;  Surgeon: Cleon Gustin, MD;  [facility];  Service: Urology;  Laterality: Right;  . WOUND DEBRIDEMENT Right 04/08/2017   Procedure: EXCISION ULCERATION RIGHT GREAT TOE;  Surgeon: Caprice Beaver, DPM;  Location: [facility];  Service: Podiatry;  Laterality: Right;  . WRIST SURGERY     Left; removal of bone fragment     SOCIAL HISTORY:  Social History   Socioeconomic History  . Marital status: Married    Spouse name: Not on file  . Number of children: Not on file  . Years of education: 33  . Highest education level: Not on file  Occupational History  . Occupation: Nurse, learning disability  . Occupation: Optometrist: [employer]  Social Needs  . Financial resource strain: Not on file  . Food insecurity:    Worry: Not  on file    Inability: Not on file  . Transportation needs:    Medical: Not on file    Non-medical: Not on file  Tobacco Use  . Smoking status: Former Smoker    Packs/day: 0.25    Years: 1.00    Pack years: 0.25    Types: Cigarettes, Cigars    Last attempt to quit: 05/17/2001    Years since quitting: 17.1  . Smokeless tobacco: Never Used  Substance and Sexual Activity  . Alcohol use: No    Alcohol/week: 0.0 standard drinks  . Drug use: No  . Sexual activity: Yes    Birth control/protection: None  Lifestyle  . Physical activity:    Days per week: Not on file    Minutes per session: Not on file  . Stress: Not on file  Relationships  . Social connections:    Talks on phone: Not on file    Gets together: Not on file     Attends religious service: Not on file    Active member of club or organization: Not on file    Attends meetings of clubs or organizations: Not on file    Relationship status: Not on file  . Intimate partner violence:    Fear of current or ex partner: Not on file    Emotionally abused: Not on file    Physically abused: Not on file    Forced sexual activity: Not on file  Other Topics Concern  . Not on file  Social History Narrative  . Not on file    FAMILY HISTORY:  Family History  Problem Relation Age of Onset  . Heart disease Mother   . Cancer Mother   . Diabetes Father   . Arthritis Unknown   . Anesthesia problems Neg Hx   . Hypotension Neg Hx   . Malignant hyperthermia Neg Hx   . Pseudochol deficiency Neg Hx     CURRENT MEDICATIONS:  Outpatient Encounter Medications as of 07/09/2018  Medication Sig Note  . acetaminophen (TYLENOL) 500 MG tablet Take 500-1,000 mg by mouth every 6 (six) hours as needed for mild pain or moderate pain.   Marland Kitchen acyclovir (ZOVIRAX) 400 MG tablet Take 1 tablet (400 mg total) by mouth every morning.   Marland Kitchen allopurinol (ZYLOPRIM) 300 MG tablet TAKE ONE TABLET BY MOUTH ONCE DAILY.   Marland Kitchen Artificial Tear Ointment (DRY EYES OP) Place 1 drop into both eyes as needed (dry eyes).   Marland Kitchen atorvastatin (LIPITOR) 80 MG tablet Take 1 tablet (80 mg total) by mouth at bedtime.   . calcitRIOL (ROCALTROL) 0.25 MCG capsule Take 0.25 mcg by mouth every Monday.  12/17/2017: Last dose Monday 7/15  . Calcium Carbonate (CALCIUM 600 PO) Take 1 tablet by mouth 2 (two) times daily.   Marland Kitchen dicyclomine (BENTYL) 20 MG tablet TAKE 1 TABLET BY MOUTH BEFORE MEALS 3 TIMES DAILY.   Marland Kitchen Eluxadoline (VIBERZI) 100 MG TABS Take 100 mg 2 (two) times daily with a meal by mouth.    . Ferrous Gluconate (IRON 27 PO) Take by mouth.   . gabapentin (NEURONTIN) 600 MG tablet Take 1 tablet (600 mg total) by mouth daily.   . insulin detemir (LEVEMIR) 100 UNIT/ML injection Inject 0.1 mLs (10 Units total) into the  skin every morning. 12/17/2017: Had 5 units this AM due to surgery  . insulin lispro (HUMALOG) 100 UNIT/ML injection Inject 0-0.1 mLs (0-10 Units total) into the skin 2 (two) times daily with a meal. Sliding Scale per  patient  CBG 150-200: 1 units CBG 201-275:  3 units CBG 275-350: 5 units 351-425: 7 units 425-500: 9 units 500+: 10 units   . loratadine (CLARITIN) 10 MG tablet Take 10 mg by mouth every morning.    . Magnesium Cl-Calcium Carbonate (SLOW-MAG PO) Take 1 tablet by mouth every morning.   . Multiple Vitamins-Minerals (MULTIVITAMINS THER. W/MINERALS) TABS Take 1 tablet by mouth daily.    . pantoprazole (PROTONIX) 40 MG tablet Take 40 mg by mouth daily.   . potassium chloride SA (K-DUR,KLOR-CON) 20 MEQ tablet Take 1 tablet (20 mEq total) by mouth daily. (Patient taking differently: Take 20 mEq by mouth 2 (two) times daily. )   . torsemide (DEMADEX) 20 MG tablet Take 1 tablet (20 mg total) by mouth 2 (two) times daily. Take extra 1 tab for weight gain of 3 lbs in 1 day or 5 lbs in 2 days   . XARELTO 20 MG TABS tablet TAKE 1 TABLET BY MOUTH DAILY.   . [DISCONTINUED] gabapentin (NEURONTIN) 300 MG capsule Take 1 capsule (300 mg total) by mouth 2 (two) times daily.   . [DISCONTINUED] gabapentin (NEURONTIN) 600 MG tablet    . nitroGLYCERIN (NITROSTAT) 0.4 MG SL tablet DISSOLVE 1 TABLET UNDER TONGUE EVERY 5 MINUTES UP TO 15 MIN FOR CHESTPAIN. IF NO RELIEF CALL 911. (Patient not taking: Reported on 07/09/2018)   . [DISCONTINUED] clotrimazole (LOTRIMIN) 1 % cream Apply 1 application topically 2 (two) times daily.   . [DISCONTINUED] dexlansoprazole (DEXILANT) 60 MG capsule Take 60 mg by mouth every morning.   . [DISCONTINUED] dicyclomine (BENTYL) 20 MG tablet TAKE 1 TABLET BY MOUTH BEFORE MEALS 3 TIMES DAILY.   . [DISCONTINUED] oxyCODONE-acetaminophen (PERCOCET) 5-325 MG tablet Take 1 tablet by mouth every 4 (four) hours as needed for severe pain.   . [DISCONTINUED] Vitamin D, Ergocalciferol,  (DRISDOL) 50000 units CAPS capsule TAKE 1 CAPSULE BY MOUTH EVERY THIRTY DAYS. 12/17/2017: Last dose 07/01   Facility-Administered Encounter Medications as of 07/09/2018  Medication  . sodium chloride 0.9 % injection 10 mL    ALLERGIES:  Allergies  Allergen Reactions  . Beef-Derived Products Diarrhea  . Pork-Derived Products Diarrhea     PHYSICAL EXAM:  ECOG Performance status: 1  Vitals:   07/09/18 1500  BP: (!) 149/69  Pulse: 88  Resp: 18  Temp: 98.9 F (37.2 C)  SpO2: 99%   Filed Weights   07/09/18 1500  Weight: (!) 374 lb 8 oz (169.9 kg)    Physical Exam Constitutional:      Appearance: Normal appearance. He is normal weight.  Cardiovascular:     Rate and Rhythm: Normal rate and regular rhythm.     Heart sounds: Normal heart sounds.  Pulmonary:     Effort: Pulmonary effort is normal.     Breath sounds: Normal breath sounds.  Musculoskeletal:     Comments: walker  Skin:    General: Skin is warm and dry.  Neurological:     Mental Status: He is alert and oriented to person, place, and time. Mental status is at baseline.  Psychiatric:        Mood and Affect: Mood normal.        Behavior: Behavior normal.        Thought Content: Thought content normal.        Judgment: Judgment normal.      LABORATORY DATA:  I have reviewed the labs as listed.  CBC    Component Value Date/Time  WBC 5.4 07/05/2018 1554   RBC 3.91 (L) 07/05/2018 1554   HGB 11.8 (L) 07/05/2018 1554   HCT 38.0 (L) 07/05/2018 1554   PLT 157 07/05/2018 1554   MCV 97.2 07/05/2018 1554   MCH 30.2 07/05/2018 1554   MCHC 31.1 07/05/2018 1554   RDW 14.0 07/05/2018 1554   LYMPHSABS 2.0 07/05/2018 1554   MONOABS 0.5 07/05/2018 1554   EOSABS 0.2 07/05/2018 1554   BASOSABS 0.0 07/05/2018 1554   CMP Latest Ref Rng & Units 07/05/2018 07/05/2018 02/25/2018  Glucose 70 - 99 mg/dL 138(H) 136(H) 145(H)  BUN 6 - 20 mg/dL 44(H) 45(H) 36(H)  Creatinine 0.61 - 1.24 mg/dL 1.99(H) 2.00(H) 1.76(H)  Sodium  135 - 145 mmol/L 140 142 140  Potassium 3.5 - 5.1 mmol/L 3.9 3.9 4.0  Chloride 98 - 111 mmol/L 108 110 110  CO2 22 - 32 mmol/L _0 Calcium 8.9 - 10.3 mg/dL 8.4(L) 8.6(L) 8.7(L)  Total Protein 6.5 - 8.1 g/dL - 6.5 7.0  Total Bilirubin 0.3 - 1.2 mg/dL - 0.5 0.7  Alkaline Phos 38 - 126 U/L - 109 115  AST 15 - 41 U/L - 38 50(H)  ALT 0 - 44 U/L - 47(H) 55(H)       DIAGNOSTIC IMAGING:  I have independently reviewed the scans and discussed with the patient.   I have reviewed Francene Finders, NP's note and agree with the documentation.  I personally performed a face-to-face visit, made revisions and my assessment and plan is as follows.    ASSESSMENT & PLAN:   Multiple myeloma ([place]) 1.  Multiple myeloma: - Maintenance [name] and dexamethasone until November 2018, which was discontinued secondary to recurrent infections culminating and development of Fournier's gangrene in November 2018.  Prior to that all his myeloma work-up was negative.  He also had a bone marrow biopsy which was negative at that time. - He denies any fevers or infections in the last 3 months.  No hospitalizations noted. -We discussed myeloma work-up from 07/05/2018 which shows negative SPEP, negative immunofixation.  Free light chain ratio was normal around 1.6.  Kappa free light chains are also stable around 67.  Creatinine is slightly elevated at 2.  This could be from torsemide. -Calcium was normal. - Physical exam shows small lymph node in the nape of the right neck.  We will keep an eye on it.  He apparently wears CPAP at nighttime.  The band is rubbing on his right posterior scalp.  This could be reactive from that. -We will see him back in 3 months for follow-up.  We will repeat labs prior to his next visit.  2.  Normocytic anemia: -This is from underlying CKD and relative iron deficiency. -He is taking iron tablet every other day and is tolerating it very well.  He last received Feraheme on 12/08/2017 and  12/14/2017. -His ferritin is mildly improved from last time.  He will continue iron every other day.       Orders placed this encounter:  Orders Placed This Encounter  Procedures  . Lactate dehydrogenase  . Protein electrophoresis, serum  . Kappa/lambda light chains  . CBC with Differential/Platelet  . Comprehensive metabolic panel  . Ferritin  . Iron and TIBC  . Vitamin B12  . Folate  . VITAMIN D 25 Hydroxy (Vit-D Deficiency, Fractures)      Derek Jack, MD [place] [phone]

## 2018-07-09 NOTE — Patient Instructions (Signed)
[facility] at [facility] Discharge Instructions     Thank you for choosing [facility] at [facility] to provide your oncology and hematology care.  To afford each patient quality time with our provider, please arrive at least 15 minutes before your scheduled appointment time.   If you have a lab appointment with the Cancer Center please come in thru the  Main Entrance and check in at the main information desk  You need to re-schedule your appointment should you arrive 10 or more minutes late.  We strive to give you quality time with our providers, and arriving late affects you and other patients whose appointments are after yours.  Also, if you no show three or more times for appointments you may be dismissed from the clinic at the providers discretion.     Again, thank you for choosing [facility].  Our hope is that these requests will decrease the amount of time that you wait before being seen by our physicians.       _____________________________________________________________  Should you have questions after your visit to [facility], please contact our office at [phone] between the hours of 8:00 a.m. and 4:30 p.m.  Voicemails left after 4:00 p.m. will not be returned until the following business day.  For prescription refill requests, have your pharmacy contact our office and allow 72 hours.    Cancer Center Support Programs:   > Cancer Support Group  2nd Tuesday of the month 1pm-2pm, Journey Room    

## 2018-07-13 ENCOUNTER — Encounter (INDEPENDENT_AMBULATORY_CARE_PROVIDER_SITE_OTHER): Payer: Self-pay | Admitting: Internal Medicine

## 2018-07-13 ENCOUNTER — Ambulatory Visit (INDEPENDENT_AMBULATORY_CARE_PROVIDER_SITE_OTHER): Payer: Self-pay | Admitting: Internal Medicine

## 2018-07-14 ENCOUNTER — Other Ambulatory Visit (HOSPITAL_COMMUNITY): Payer: Self-pay | Admitting: Urology

## 2018-07-14 ENCOUNTER — Other Ambulatory Visit: Payer: Self-pay | Admitting: Urology

## 2018-07-14 DIAGNOSIS — N200 Calculus of kidney: Secondary | ICD-10-CM

## 2018-07-28 ENCOUNTER — Ambulatory Visit (HOSPITAL_COMMUNITY)
Admission: RE | Admit: 2018-07-28 | Discharge: 2018-07-28 | Disposition: A | Discharge status: Home / Self Care | Payer: BLUE CROSS/BLUE SHIELD | Source: Ambulatory Visit | Attending: Urology | Admitting: Urology

## 2018-07-28 DIAGNOSIS — N200 Calculus of kidney: Secondary | ICD-10-CM | POA: Insufficient documentation

## 2018-07-30 ENCOUNTER — Other Ambulatory Visit: Payer: Self-pay | Admitting: Cardiovascular Disease

## 2018-09-09 ENCOUNTER — Other Ambulatory Visit: Payer: Self-pay | Admitting: Cardiovascular Disease

## 2018-10-01 ENCOUNTER — Inpatient Hospital Stay (HOSPITAL_COMMUNITY): Payer: BLUE CROSS/BLUE SHIELD | Attending: Hematology

## 2018-10-01 ENCOUNTER — Other Ambulatory Visit: Payer: Self-pay

## 2018-10-01 DIAGNOSIS — K529 Noninfective gastroenteritis and colitis, unspecified: Secondary | ICD-10-CM | POA: Diagnosis present

## 2018-10-01 DIAGNOSIS — Z872 Personal history of diseases of the skin and subcutaneous tissue: Secondary | ICD-10-CM | POA: Diagnosis not present

## 2018-10-01 DIAGNOSIS — D631 Anemia in chronic kidney disease: Secondary | ICD-10-CM | POA: Insufficient documentation

## 2018-10-01 DIAGNOSIS — I252 Old myocardial infarction: Secondary | ICD-10-CM | POA: Insufficient documentation

## 2018-10-01 DIAGNOSIS — N183 Chronic kidney disease, stage 3 (moderate): Secondary | ICD-10-CM | POA: Diagnosis not present

## 2018-10-01 DIAGNOSIS — E611 Iron deficiency: Secondary | ICD-10-CM | POA: Diagnosis not present

## 2018-10-01 DIAGNOSIS — Z833 Family history of diabetes mellitus: Secondary | ICD-10-CM | POA: Insufficient documentation

## 2018-10-01 DIAGNOSIS — Z87891 Personal history of nicotine dependence: Secondary | ICD-10-CM | POA: Diagnosis not present

## 2018-10-01 DIAGNOSIS — C9001 Multiple myeloma in remission: Secondary | ICD-10-CM | POA: Insufficient documentation

## 2018-10-01 DIAGNOSIS — Z809 Family history of malignant neoplasm, unspecified: Secondary | ICD-10-CM | POA: Insufficient documentation

## 2018-10-01 DIAGNOSIS — Z8261 Family history of arthritis: Secondary | ICD-10-CM | POA: Insufficient documentation

## 2018-10-01 DIAGNOSIS — Z8249 Family history of ischemic heart disease and other diseases of the circulatory system: Secondary | ICD-10-CM | POA: Diagnosis not present

## 2018-10-01 DIAGNOSIS — C9000 Multiple myeloma not having achieved remission: Secondary | ICD-10-CM

## 2018-10-01 LAB — CBC WITH DIFFERENTIAL/PLATELET
Abs Immature Granulocytes: 0.01 10*3/uL (ref 0.00–0.07)
Basophils Absolute: 0 10*3/uL (ref 0.0–0.1)
Basophils Relative: 0 %
Eosinophils Absolute: 0.2 10*3/uL (ref 0.0–0.5)
Eosinophils Relative: 3 %
HCT: 38.1 % — ABNORMAL LOW (ref 39.0–52.0)
Hemoglobin: 12.2 g/dL — ABNORMAL LOW (ref 13.0–17.0)
Immature Granulocytes: 0 %
Lymphocytes Relative: 34 %
Lymphs Abs: 1.7 10*3/uL (ref 0.7–4.0)
MCH: 31.3 pg (ref 26.0–34.0)
MCHC: 32 g/dL (ref 30.0–36.0)
MCV: 97.7 fL (ref 80.0–100.0)
Monocytes Absolute: 0.5 10*3/uL (ref 0.1–1.0)
Monocytes Relative: 10 %
Neutro Abs: 2.7 10*3/uL (ref 1.7–7.7)
Neutrophils Relative %: 53 %
Platelets: 155 10*3/uL (ref 150–400)
RBC: 3.9 MIL/uL — ABNORMAL LOW (ref 4.22–5.81)
RDW: 14.8 % (ref 11.5–15.5)
WBC: 5.2 10*3/uL (ref 4.0–10.5)
nRBC: 0 % (ref 0.0–0.2)

## 2018-10-01 LAB — FERRITIN: Ferritin: 77 ng/mL (ref 24–336)

## 2018-10-01 LAB — COMPREHENSIVE METABOLIC PANEL
ALT: 53 U/L — ABNORMAL HIGH (ref 0–44)
AST: 58 U/L — ABNORMAL HIGH (ref 15–41)
Albumin: 3.5 g/dL (ref 3.5–5.0)
Alkaline Phosphatase: 147 U/L — ABNORMAL HIGH (ref 38–126)
Anion gap: 8 (ref 5–15)
BUN: 50 mg/dL — ABNORMAL HIGH (ref 6–20)
CO2: 22 mmol/L (ref 22–32)
Calcium: 8.3 mg/dL — ABNORMAL LOW (ref 8.9–10.3)
Chloride: 111 mmol/L (ref 98–111)
Creatinine, Ser: 2.11 mg/dL — ABNORMAL HIGH (ref 0.61–1.24)
GFR calc Af Amer: 39 mL/min — ABNORMAL LOW (ref >60)
GFR calc non Af Amer: 33 mL/min — ABNORMAL LOW (ref >60)
Glucose, Bld: 134 mg/dL — ABNORMAL HIGH (ref 70–99)
Potassium: 4.3 mmol/L (ref 3.5–5.1)
Sodium: 141 mmol/L (ref 135–145)
Total Bilirubin: 0.7 mg/dL (ref 0.3–1.2)
Total Protein: 6.8 g/dL (ref 6.5–8.1)

## 2018-10-01 LAB — IRON AND TIBC
Iron: 72 ug/dL (ref 45–182)
Saturation Ratios: 24 % (ref 17.9–39.5)
TIBC: 303 ug/dL (ref 250–450)
UIBC: 231 ug/dL

## 2018-10-01 LAB — VITAMIN B12: Vitamin B-12: 581 pg/mL (ref 180–914)

## 2018-10-01 LAB — LACTATE DEHYDROGENASE: LDH: 227 U/L — ABNORMAL HIGH (ref 98–192)

## 2018-10-01 LAB — FOLATE: Folate: 20.6 ng/mL (ref >5.9)

## 2018-10-02 LAB — VITAMIN D 25 HYDROXY (VIT D DEFICIENCY, FRACTURES): Vit D, 25-Hydroxy: 35.6 ng/mL (ref 30.0–100.0)

## 2018-10-04 LAB — PROTEIN ELECTROPHORESIS, SERUM
A/G Ratio: 1.1 (ref 0.7–1.7)
Albumin ELP: 3.2 g/dL (ref 2.9–4.4)
Alpha-1-Globulin: 0.2 g/dL (ref 0.0–0.4)
Alpha-2-Globulin: 0.7 g/dL (ref 0.4–1.0)
Beta Globulin: 0.9 g/dL (ref 0.7–1.3)
Gamma Globulin: 0.9 g/dL (ref 0.4–1.8)
Globulin, Total: 2.8 g/dL (ref 2.2–3.9)
Total Protein ELP: 6 g/dL (ref 6.0–8.5)

## 2018-10-04 LAB — KAPPA/LAMBDA LIGHT CHAINS
Kappa free light chain: 73.6 mg/L — ABNORMAL HIGH (ref 3.3–19.4)
Kappa, lambda light chain ratio: 1.45 (ref 0.26–1.65)
Lambda free light chains: 50.7 mg/L — ABNORMAL HIGH (ref 5.7–26.3)

## 2018-10-08 ENCOUNTER — Encounter (HOSPITAL_COMMUNITY): Payer: Self-pay | Admitting: Hematology

## 2018-10-08 ENCOUNTER — Other Ambulatory Visit: Payer: Self-pay

## 2018-10-08 ENCOUNTER — Inpatient Hospital Stay (HOSPITAL_COMMUNITY): Payer: BLUE CROSS/BLUE SHIELD | Admitting: Hematology

## 2018-10-08 DIAGNOSIS — N183 Chronic kidney disease, stage 3 (moderate): Secondary | ICD-10-CM

## 2018-10-08 DIAGNOSIS — K529 Noninfective gastroenteritis and colitis, unspecified: Secondary | ICD-10-CM | POA: Diagnosis not present

## 2018-10-08 DIAGNOSIS — D631 Anemia in chronic kidney disease: Secondary | ICD-10-CM

## 2018-10-08 DIAGNOSIS — Z8261 Family history of arthritis: Secondary | ICD-10-CM

## 2018-10-08 DIAGNOSIS — Z87891 Personal history of nicotine dependence: Secondary | ICD-10-CM

## 2018-10-08 DIAGNOSIS — E611 Iron deficiency: Secondary | ICD-10-CM

## 2018-10-08 DIAGNOSIS — Z8249 Family history of ischemic heart disease and other diseases of the circulatory system: Secondary | ICD-10-CM

## 2018-10-08 DIAGNOSIS — I252 Old myocardial infarction: Secondary | ICD-10-CM

## 2018-10-08 DIAGNOSIS — Z833 Family history of diabetes mellitus: Secondary | ICD-10-CM

## 2018-10-08 DIAGNOSIS — Z872 Personal history of diseases of the skin and subcutaneous tissue: Secondary | ICD-10-CM

## 2018-10-08 DIAGNOSIS — C9001 Multiple myeloma in remission: Secondary | ICD-10-CM

## 2018-10-08 DIAGNOSIS — C9000 Multiple myeloma not having achieved remission: Secondary | ICD-10-CM

## 2018-10-08 DIAGNOSIS — Z809 Family history of malignant neoplasm, unspecified: Secondary | ICD-10-CM

## 2018-10-08 NOTE — Patient Instructions (Addendum)
[place] at [name] Discharge Instructions  You were seen today by [name]. He went over your recent labs results. He will see you back in 4 months for labs and follow up.   Thank you for choosing [place] at [facility] to provide your oncology and hematology care.  To afford each patient quality time with our provider, please arrive at least 15 minutes before your scheduled appointment time.   If you have a lab appointment with the [facility] please come in thru the  Main Entrance and check in at the main information desk  You need to re-schedule your appointment should you arrive 10 or more minutes late.  We strive to give you quality time with our providers, and arriving late affects you and other patients whose appointments are after yours.  Also, if you no show three or more times for appointments you may be dismissed from the clinic at the providers discretion.     Again, thank you for choosing [facility].  Our hope is that these requests will decrease the amount of time that you wait before being seen by our physicians.       _____________________________________________________________  Should you have questions after your visit to [facility], please contact our office at [phone] between the hours of 8:00 a.m. and 4:30 p.m.  Voicemails left after 4:00 p.m. will not be returned until the following business day.  For prescription refill requests, have your pharmacy contact our office and allow 72 hours.    Cancer Center Support Programs:   > Cancer Support Group  2nd Tuesday of the month 1pm-2pm, Journey Room

## 2018-10-08 NOTE — Progress Notes (Signed)
[address] [place], [place] [address]   CLINIC:  Medical Oncology/Hematology  PCP:  [name], [address] [place] [address] [phone]   REASON FOR VISIT:  Follow-up formultiple myeloma  CURRENT [facility]    BRIEF ONCOLOGIC HISTORY:   No history exists.     CANCER STAGING: Cancer Staging No matching staging information was found for the patient.   INTERVAL HISTORY:  Mr. Curenton 58 y.o. male returns for routine follow-up. He is here today alone. He is up ambulating with a cane today. He states that he has been doing well since his last visit. He states that he has lew swelling at times. He state that he continues to have diarrhea at times as well. Denies any nausea, or vomiting. Denies any new pains. Had not noticed any recent bleeding such as epistaxis, hematuria or hematochezia. Denies recent chest pain on exertion, shortness of breath on minimal exertion, pre-syncopal episodes, or palpitations.  Denies any recent fevers, infections, or recent hospitalizations. Patient reports appetite at 100% and energy level at 100%.   REVIEW OF SYSTEMS:  Review of Systems  Gastrointestinal: Positive for diarrhea.     PAST MEDICAL/SURGICAL HISTORY:  Past Medical History:  Diagnosis Date  . Abscess 04/2017   Scrotal  . Anemia   . Arteriosclerotic cardiovascular disease (ASCVD)    MI-2000s; stent to the proximal LAD and diagonal in 2001; stress nuclear in 2008-impaired exercise capacity, left ventricular dilatation, moderately to severely depressed EF, apical, inferior and anteroseptal scar  . Arthritis   . Atrial flutter ([facility])   . 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 Cellulitis of leg    both legs  . Chronic diarrhea   . Chronic kidney disease, stage 3, mod decreased GFR (HCC)    Creatinine of 1.84 in 06/2011 and 1.5 in 07/2011  . Diabetes mellitus    Insulin  . Dysrhythmia    AFlutter  . GERD (gastroesophageal  reflux disease)   . Gout   . Hyperlipidemia   . Hypertension   . Injection site reaction   . Multiple myeloma 07/01/2011  . Myocardial infarction ([facility]) 2000  . Obesity   . Pedal edema    Venous insufficiency  . Sleep apnea    uses cpap  . Ulcer    Past Surgical History:  Procedure Laterality Date  . ABSCESS DRAINAGE     Scrotal  . BIOPSY  01/02/2012   Procedure: BIOPSY;  Surgeon: Rogene Houston, MD;  [facility];  Service: Endoscopy;  Laterality: N/A;  . BONE MARROW BIOPSY  05/13/11  . CARDIAC CATHETERIZATION     cardiac stent  . CARDIOVERSION N/A 10/13/2012   Procedure: CARDIOVERSION;  Surgeon: Yehuda Savannah, MD;  Location: [facility];  Service: Cardiovascular;  Laterality: N/A;  . CATARACT EXTRACTION W/PHACO Left 02/13/2014   Procedure: CATARACT EXTRACTION PHACO AND INTRAOCULAR LENS PLACEMENT ([place]);  Surgeon: Tonny Branch, MD;  Location: [facility];  Service: Ophthalmology;  Laterality: Left;  CDE:  7.67  . CATARACT EXTRACTION W/PHACO Right 03/02/2014   Procedure: CATARACT EXTRACTION PHACO AND INTRAOCULAR LENS PLACEMENT RIGHT EYE CDE=16.81;  Surgeon: Tonny Branch, MD;  Location: [facility];  Service: Ophthalmology;  Laterality: Right;  . COLONOSCOPY  11/28/2011   Procedure: COLONOSCOPY;  Surgeon: Rogene Houston, MD;  [facility];  Service: Endoscopy;  Laterality: N/A;  930  . CYSTOSCOPY W/ RETROGRADES Right 12/16/2017   Procedure: CYSTOSCOPY WITH RIGHT RETROGRADE PYELOGRAM AND  RIGHT URETERAL STENT REMOVAL;  Surgeon: Cleon Gustin, MD;  Location: [facility];  Service: Urology;  Laterality: Right;  . CYSTOSCOPY W/ URETERAL STENT PLACEMENT Right 07/31/2017   Procedure: CYSTOSCOPY WITH RETROGRADE PYELOGRAM/URETERAL STENT PLACEMENT;  Surgeon: Cleon Gustin, MD;  [facility];  Service: Urology;  Laterality: Right;  . ESOPHAGOGASTRODUODENOSCOPY  01/02/2012   Procedure: ESOPHAGOGASTRODUODENOSCOPY (EGD);  Surgeon: Rogene Houston, MD;  [facility];  Service:  Endoscopy;  Laterality: N/A;  100  . ESOPHAGOGASTRODUODENOSCOPY N/A 09/20/2012   Procedure: ESOPHAGOGASTRODUODENOSCOPY (EGD);  Surgeon: Rogene Houston, MD;  [facility];  Service: Endoscopy;  Laterality: N/A;  . EUS N/A 10/07/2012   Procedure: UPPER ENDOSCOPIC ULTRASOUND (EUS) LINEAR;  Surgeon: Milus Banister, MD;  [facility];  Service: Endoscopy;  Laterality: N/A;  . INCISION AND DRAINAGE ABSCESS N/A 04/14/2017   Procedure: INCISION AND DRAINAGE ABSCESS;  Surgeon: Ceasar Mons, MD;  [facility];  Service: Urology;  Laterality: N/A;  . INCISION AND DRAINAGE ABSCESS ANAL    . IRRIGATION AND DEBRIDEMENT ABSCESS N/A 04/15/2017   Procedure: DEBRIDEMENT SCROTAL WOUND AND DRESSING CHANGE;  Surgeon: Ceasar Mons, MD;  [facility];  Service: Urology;  Laterality: N/A;  . IRRIGATION AND DEBRIDEMENT ABSCESS N/A 04/17/2017   Procedure: IRRIGATION AND DEBRIDEMENT ABSCESS;  Surgeon: Ceasar Mons, MD;  [facility];  Service: Urology;  Laterality: N/A;  RM 3  . LAPAROSCOPIC GASTRIC BANDING  2006   has been removed  . OSTECTOMY Right 04/08/2017   Procedure: OSTECTOMY RIGHT GREAT TOE;  Surgeon: Caprice Beaver, DPM;  [facility];  Service: Podiatry;  Laterality: Right;  . PORT-A-CATH REMOVAL Left 12/07/2012   Procedure: REMOVAL PORT-A-CATH;  Surgeon: Scherry Ran, MD;  [facility];  Service: General;  Laterality: Left;  . PORT-A-CATH REMOVAL N/A 10/02/2017   Procedure: MINOR REMOVAL PORT-A-CATH AT BEDSIDE;  Surgeon: Donnie Mesa, MD;  Location: [facility];  Service: General;  Laterality: N/A;  . PORTACATH PLACEMENT  07/07/2011   Procedure: INSERTION PORT-A-CATH;  Surgeon: Scherry Ran, MD;  [facility];  Service: General;  Laterality: N/A;  . PORTACATH PLACEMENT N/A 12/07/2012   Procedure: INSERTION PORT-A-CATH;  Surgeon: Scherry Ran, MD;  Location: [facility];  Service: General;  Laterality: N/A;  Attempted  portacath placement on left and right side  . URETEROSCOPY Right 12/16/2017   Procedure: DIAGNOSTIC RIGHT URETEROSCOPY;  Surgeon: Cleon Gustin, MD;  [facility];  Service: Urology;  Laterality: Right;  . WOUND DEBRIDEMENT Right 04/08/2017   Procedure: EXCISION ULCERATION RIGHT GREAT TOE;  Surgeon: Caprice Beaver, DPM;  [facility];  Service: Podiatry;  Laterality: Right;  . WRIST SURGERY     Left; removal of bone fragment     SOCIAL HISTORY:  Social History   Socioeconomic History  . Marital status: Married    Spouse name: Not on file  . Number of children: Not on file  . Years of education: 58  . Highest education level: Not on file  Occupational History  . Occupation: Nurse, learning disability  . Occupation: Optometrist: [employer]  Social Needs  . Financial resource strain: Not on file  . Food insecurity:    Worry: Not on file    Inability: Not on file  . Transportation needs:    Medical: Not on file    Non-medical: Not on file  Tobacco Use  . Smoking status: Former Smoker  Packs/day: 0.25    Years: 1.00    Pack years: 0.25    Types: Cigarettes, Cigars    Last attempt to quit: 05/17/2001    Years since quitting: 17.4  . Smokeless tobacco: Never Used  Substance and Sexual Activity  . Alcohol use: No    Alcohol/week: 0.0 standard drinks  . Drug use: No  . Sexual activity: Yes    Birth control/protection: None  Lifestyle  . Physical activity:    Days per week: Not on file    Minutes per session: Not on file  . Stress: Not on file  Relationships  . Social connections:    Talks on phone: Not on file    Gets together: Not on file    Attends religious service: Not on file    Active member of club or organization: Not on file    Attends meetings of clubs or organizations: Not on file    Relationship status: Not on file  . Intimate partner violence:    Fear of current or ex partner: Not on file    Emotionally abused: Not  on file    Physically abused: Not on file    Forced sexual activity: Not on file  Other Topics Concern  . Not on file  Social History Narrative  . Not on file    FAMILY HISTORY:  Family History  Problem Relation Age of Onset  . Heart disease Mother   . Cancer Mother   . Diabetes Father   . Arthritis Other   . Anesthesia problems Neg Hx   . Hypotension Neg Hx   . Malignant hyperthermia Neg Hx   . Pseudochol deficiency Neg Hx     CURRENT MEDICATIONS:  Outpatient Encounter Medications as of 10/08/2018  Medication Sig Note  . acetaminophen (TYLENOL) 500 MG tablet Take 500-1,000 mg by mouth every 6 (six) hours as needed for mild pain or moderate pain.   Marland Kitchen acyclovir (ZOVIRAX) 400 MG tablet Take 1 tablet (400 mg total) by mouth every morning.   Marland Kitchen allopurinol (ZYLOPRIM) 300 MG tablet TAKE ONE TABLET BY MOUTH ONCE DAILY.   Marland Kitchen Artificial Tear Ointment (DRY EYES OP) Place 1 drop into both eyes as needed (dry eyes).   Marland Kitchen atorvastatin (LIPITOR) 80 MG tablet Take 1 tablet (80 mg total) by mouth at bedtime.   . calcitRIOL (ROCALTROL) 0.25 MCG capsule Take 0.25 mcg by mouth every Monday.  12/17/2017: Last dose Monday 7/15  . Calcium Carbonate (CALCIUM 600 PO) Take 1 tablet by mouth 2 (two) times daily.   . CONTOUR NEXT TEST test strip    . dicyclomine (BENTYL) 20 MG tablet TAKE 1 TABLET BY MOUTH BEFORE MEALS 3 TIMES DAILY.   Marland Kitchen Eluxadoline (VIBERZI) 100 MG TABS Take 100 mg 2 (two) times daily with a meal by mouth.    . Ferrous Gluconate (IRON 27 PO) Take by mouth.   . gabapentin (NEURONTIN) 600 MG tablet Take 1 tablet (600 mg total) by mouth daily.   . insulin detemir (LEVEMIR) 100 UNIT/ML injection Inject 0.1 mLs (10 Units total) into the skin every morning. 12/17/2017: Had 5 units this AM due to surgery  . INSULIN SYRINGE .5CC/29G 29G X 1/2" 0.5 ML MISC    . loratadine (CLARITIN) 10 MG tablet Take 10 mg by mouth every morning.    . Magnesium Cl-Calcium Carbonate (SLOW-MAG PO) Take 1 tablet by  mouth every morning.   . Microlet Lancets MISC    . Multiple Vitamins-Minerals (MULTIVITAMINS THER.  W/MINERALS) TABS Take 1 tablet by mouth daily.    . nitroGLYCERIN (NITROSTAT) 0.4 MG SL tablet DISSOLVE 1 TABLET UNDER TONGUE EVERY 5 MINUTES UP TO 15 MIN FOR CHESTPAIN. IF NO RELIEF CALL 911.   . pantoprazole (PROTONIX) 40 MG tablet Take 40 mg by mouth daily.   . potassium chloride SA (K-DUR,KLOR-CON) 20 MEQ tablet Take 1 tablet (20 mEq total) by mouth daily. (Patient taking differently: Take 20 mEq by mouth 2 (two) times daily. )   . torsemide (DEMADEX) 20 MG tablet Take 1 tablet (20 mg total) by mouth 2 (two) times daily. Take extra 1 tab for weight gain of 3 lbs in 1 day or 5 lbs in 2 days   . XARELTO 20 MG TABS tablet TAKE 1 TABLET BY MOUTH DAILY.   Marland Kitchen insulin lispro (HUMALOG) 100 UNIT/ML injection Inject 0-0.1 mLs (0-10 Units total) into the skin 2 (two) times daily with a meal. Sliding Scale per patient  CBG 150-200: 1 units CBG 201-275:  3 units CBG 275-350: 5 units 351-425: 7 units 425-500: 9 units 500+: 10 units    Facility-Administered Encounter Medications as of 10/08/2018  Medication  . sodium chloride 0.9 % injection 10 mL    ALLERGIES:  Allergies  Allergen Reactions  . Beef-Derived Products Diarrhea  . Pork-Derived Products Diarrhea     PHYSICAL EXAM:  ECOG Performance status: 1  Vitals:   10/08/18 1048  BP: 136/77  Pulse: 68  Resp: 16  Temp: 98 F (36.7 C)  SpO2: 100%   Filed Weights   10/08/18 1048  Weight: (!) 367 lb 12.8 oz (166.8 kg)    Physical Exam Vitals signs reviewed.  Constitutional:      Appearance: Normal appearance.  Cardiovascular:     Rate and Rhythm: Normal rate and regular rhythm.     Heart sounds: Normal heart sounds.  Pulmonary:     Effort: Pulmonary effort is normal.     Breath sounds: Normal breath sounds.  Abdominal:     General: There is no distension.     Palpations: Abdomen is soft. There is no mass.  Musculoskeletal:         General: No swelling.  Skin:    General: Skin is warm.  Neurological:     General: No focal deficit present.     Mental Status: He is alert and oriented to person, place, and time.  Psychiatric:        Mood and Affect: Mood normal.        Behavior: Behavior normal.      LABORATORY DATA:  I have reviewed the labs as listed.  CBC    Component Value Date/Time   WBC 5.2 10/01/2018 1144   RBC 3.90 (L) 10/01/2018 1144   HGB 12.2 (L) 10/01/2018 1144   HCT 38.1 (L) 10/01/2018 1144   PLT 155 10/01/2018 1144   MCV 97.7 10/01/2018 1144   MCH 31.3 10/01/2018 1144   MCHC 32.0 10/01/2018 1144   RDW 14.8 10/01/2018 1144   LYMPHSABS 1.7 10/01/2018 1144   MONOABS 0.5 10/01/2018 1144   EOSABS 0.2 10/01/2018 1144   BASOSABS 0.0 10/01/2018 1144   CMP Latest Ref Rng & Units 10/01/2018 07/05/2018 07/05/2018  Glucose 70 - 99 mg/dL 134(H) 138(H) 136(H)  BUN 6 - 20 mg/dL 50(H) 44(H) 45(H)  Creatinine 0.61 - 1.24 mg/dL 2.11(H) 1.99(H) 2.00(H)  Sodium 135 - 145 mmol/L 141 140 142  Potassium 3.5 - 5.1 mmol/L 4.3 3.9 3.9  Chloride 98 -  111 mmol/L 111 108 110  CO2 22 - 32 mmol/L '22 23 25  ' Calcium 8.9 - 10.3 mg/dL 8.3(L) 8.4(L) 8.6(L)  Total Protein 6.5 - 8.1 g/dL 6.8 - 6.5  Total Bilirubin 0.3 - 1.2 mg/dL 0.7 - 0.5  Alkaline Phos 38 - 126 U/L 147(H) - 109  AST 15 - 41 U/L 58(H) - 38  ALT 0 - 44 U/L 53(H) - 47(H)       DIAGNOSTIC IMAGING:  I have independently reviewed the scans and discussed with the patient.   I have reviewed Venita Lick LPN's note and agree with the documentation.  I personally performed a face-to-face visit, made revisions and my assessment and plan is as follows.    ASSESSMENT & PLAN:   Multiple myeloma ([facility] [place]) 1.  Multiple myeloma: - I could not locate the type of myeloma involved has all his immunofixation has been negative since 2017. - Maintenance [name] and dexamethasone until #2018, discontinued secondary to recurrent infections culminating in development  of Fournier's gangrene in November 2018.  His bone marrow biopsy was also negative prior to that. - Myeloma labs on 09/30/2017 shows negative SPEP and a normal free light chain ratio.  Kappa light chains were 73 and lambda light chains were 50.  He does have CKD with creatinine around 2.0.  Calcium was normal and he is not severely anemic. - Hence we will see him back in 4 months for follow-up with repeat labs.  2.  Normocytic anemia: -He has mild anemia from combination of CKD and relative iron deficiency.  Ferritin is 77 and percent saturation is 24.  E02 and folic acid are normal. - He received Feraheme on 12/08/2017 and 12/14/2017. - He will continue iron tablet every other day.      Orders placed this encounter:  Orders Placed This Encounter  Procedures  . CBC with Differential/Platelet  . Comprehensive metabolic panel  . Protein electrophoresis, serum  . Kappa/lambda light chains  . Lactate dehydrogenase  . Iron and TIBC  . Ferritin  . Vitamin B12  . Folate  . Hemoglobin A1c      Derek Jack, MD [facility] [phone]

## 2018-10-08 NOTE — Assessment & Plan Note (Signed)
1.  Multiple myeloma: - I could not locate the type of myeloma involved has all his immunofixation has been negative since 2017. - Maintenance [name] and dexamethasone until #2018, discontinued secondary to recurrent infections culminating in development of Fournier's gangrene in November 2018.  His bone marrow biopsy was also negative prior to that. - Myeloma labs on 09/30/2017 shows negative SPEP and a normal free light chain ratio.  Kappa light chains were 73 and lambda light chains were 50.  He does have CKD with creatinine around 2.0.  Calcium was normal and he is not severely anemic. - Hence we will see him back in 4 months for follow-up with repeat labs.  2.  Normocytic anemia: -He has mild anemia from combination of CKD and relative iron deficiency.  Ferritin is 77 and percent saturation is 24.  Z58 and folic acid are normal. - He received Feraheme on 12/08/2017 and 12/14/2017. - He will continue iron tablet every other day.

## 2018-11-03 ENCOUNTER — Other Ambulatory Visit (HOSPITAL_COMMUNITY): Payer: Self-pay | Admitting: Nurse Practitioner

## 2018-11-03 DIAGNOSIS — C9000 Multiple myeloma not having achieved remission: Secondary | ICD-10-CM

## 2018-11-12 ENCOUNTER — Other Ambulatory Visit: Payer: BLUE CROSS/BLUE SHIELD

## 2018-11-12 ENCOUNTER — Other Ambulatory Visit: Payer: Self-pay

## 2018-11-12 DIAGNOSIS — Z20822 Contact with and (suspected) exposure to covid-19: Secondary | ICD-10-CM

## 2018-11-18 LAB — NOVEL CORONAVIRUS, NAA: SARS-CoV-2, NAA: NOT DETECTED

## 2018-12-06 ENCOUNTER — Other Ambulatory Visit (HOSPITAL_COMMUNITY): Payer: Self-pay | Admitting: Nurse Practitioner

## 2018-12-06 DIAGNOSIS — C9000 Multiple myeloma not having achieved remission: Secondary | ICD-10-CM

## 2019-01-03 ENCOUNTER — Other Ambulatory Visit (HOSPITAL_COMMUNITY): Payer: Self-pay | Admitting: Nurse Practitioner

## 2019-01-03 DIAGNOSIS — C9000 Multiple myeloma not having achieved remission: Secondary | ICD-10-CM

## 2019-01-10 ENCOUNTER — Other Ambulatory Visit: Payer: Self-pay | Admitting: Cardiovascular Disease

## 2019-02-01 ENCOUNTER — Other Ambulatory Visit: Payer: Self-pay

## 2019-02-01 ENCOUNTER — Inpatient Hospital Stay (HOSPITAL_COMMUNITY): Payer: BC Managed Care – PPO | Attending: Hematology

## 2019-02-01 DIAGNOSIS — Z794 Long term (current) use of insulin: Secondary | ICD-10-CM | POA: Insufficient documentation

## 2019-02-01 DIAGNOSIS — I2510 Atherosclerotic heart disease of native coronary artery without angina pectoris: Secondary | ICD-10-CM | POA: Diagnosis not present

## 2019-02-01 DIAGNOSIS — N183 Chronic kidney disease, stage 3 (moderate): Secondary | ICD-10-CM | POA: Diagnosis not present

## 2019-02-01 DIAGNOSIS — R5383 Other fatigue: Secondary | ICD-10-CM | POA: Diagnosis not present

## 2019-02-01 DIAGNOSIS — M1990 Unspecified osteoarthritis, unspecified site: Secondary | ICD-10-CM | POA: Insufficient documentation

## 2019-02-01 DIAGNOSIS — I129 Hypertensive chronic kidney disease with stage 1 through stage 4 chronic kidney disease, or unspecified chronic kidney disease: Secondary | ICD-10-CM | POA: Insufficient documentation

## 2019-02-01 DIAGNOSIS — E785 Hyperlipidemia, unspecified: Secondary | ICD-10-CM | POA: Insufficient documentation

## 2019-02-01 DIAGNOSIS — Z79899 Other long term (current) drug therapy: Secondary | ICD-10-CM | POA: Insufficient documentation

## 2019-02-01 DIAGNOSIS — C9000 Multiple myeloma not having achieved remission: Secondary | ICD-10-CM

## 2019-02-01 DIAGNOSIS — Z7901 Long term (current) use of anticoagulants: Secondary | ICD-10-CM | POA: Diagnosis not present

## 2019-02-01 DIAGNOSIS — D631 Anemia in chronic kidney disease: Secondary | ICD-10-CM | POA: Insufficient documentation

## 2019-02-01 DIAGNOSIS — R197 Diarrhea, unspecified: Secondary | ICD-10-CM | POA: Diagnosis not present

## 2019-02-01 DIAGNOSIS — E1122 Type 2 diabetes mellitus with diabetic chronic kidney disease: Secondary | ICD-10-CM | POA: Insufficient documentation

## 2019-02-01 DIAGNOSIS — K219 Gastro-esophageal reflux disease without esophagitis: Secondary | ICD-10-CM | POA: Diagnosis not present

## 2019-02-01 DIAGNOSIS — Z87891 Personal history of nicotine dependence: Secondary | ICD-10-CM | POA: Insufficient documentation

## 2019-02-01 DIAGNOSIS — R600 Localized edema: Secondary | ICD-10-CM | POA: Insufficient documentation

## 2019-02-01 DIAGNOSIS — C9001 Multiple myeloma in remission: Secondary | ICD-10-CM | POA: Insufficient documentation

## 2019-02-01 DIAGNOSIS — I252 Old myocardial infarction: Secondary | ICD-10-CM | POA: Diagnosis not present

## 2019-02-01 LAB — CBC WITH DIFFERENTIAL/PLATELET
Abs Immature Granulocytes: 0.01 10*3/uL (ref 0.00–0.07)
Basophils Absolute: 0 10*3/uL (ref 0.0–0.1)
Basophils Relative: 0 %
Eosinophils Absolute: 0.1 10*3/uL (ref 0.0–0.5)
Eosinophils Relative: 2 %
HCT: 40.2 % (ref 39.0–52.0)
Hemoglobin: 12.8 g/dL — ABNORMAL LOW (ref 13.0–17.0)
Immature Granulocytes: 0 %
Lymphocytes Relative: 38 %
Lymphs Abs: 2.2 10*3/uL (ref 0.7–4.0)
MCH: 31.4 pg (ref 26.0–34.0)
MCHC: 31.8 g/dL (ref 30.0–36.0)
MCV: 98.5 fL (ref 80.0–100.0)
Monocytes Absolute: 0.5 10*3/uL (ref 0.1–1.0)
Monocytes Relative: 9 %
Neutro Abs: 2.9 10*3/uL (ref 1.7–7.7)
Neutrophils Relative %: 51 %
Platelets: 148 10*3/uL — ABNORMAL LOW (ref 150–400)
RBC: 4.08 MIL/uL — ABNORMAL LOW (ref 4.22–5.81)
RDW: 14.1 % (ref 11.5–15.5)
WBC: 5.9 10*3/uL (ref 4.0–10.5)
nRBC: 0 % (ref 0.0–0.2)

## 2019-02-01 LAB — COMPREHENSIVE METABOLIC PANEL
ALT: 39 U/L (ref 0–44)
AST: 41 U/L (ref 15–41)
Albumin: 3.5 g/dL (ref 3.5–5.0)
Alkaline Phosphatase: 106 U/L (ref 38–126)
Anion gap: 8 (ref 5–15)
BUN: 55 mg/dL — ABNORMAL HIGH (ref 6–20)
CO2: 19 mmol/L — ABNORMAL LOW (ref 22–32)
Calcium: 8.5 mg/dL — ABNORMAL LOW (ref 8.9–10.3)
Chloride: 112 mmol/L — ABNORMAL HIGH (ref 98–111)
Creatinine, Ser: 2.38 mg/dL — ABNORMAL HIGH (ref 0.61–1.24)
GFR calc Af Amer: 33 mL/min — ABNORMAL LOW (ref >60)
GFR calc non Af Amer: 29 mL/min — ABNORMAL LOW (ref >60)
Glucose, Bld: 144 mg/dL — ABNORMAL HIGH (ref 70–99)
Potassium: 3.8 mmol/L (ref 3.5–5.1)
Sodium: 139 mmol/L (ref 135–145)
Total Bilirubin: 0.9 mg/dL (ref 0.3–1.2)
Total Protein: 6.7 g/dL (ref 6.5–8.1)

## 2019-02-01 LAB — HEMOGLOBIN A1C
Hgb A1c MFr Bld: 7.2 % — ABNORMAL HIGH (ref 4.8–5.6)
Mean Plasma Glucose: 159.94 mg/dL

## 2019-02-01 LAB — FERRITIN: Ferritin: 112 ng/mL (ref 24–336)

## 2019-02-01 LAB — VITAMIN B12: Vitamin B-12: 578 pg/mL (ref 180–914)

## 2019-02-01 LAB — IRON AND TIBC
Iron: 89 ug/dL (ref 45–182)
Saturation Ratios: 30 % (ref 17.9–39.5)
TIBC: 301 ug/dL (ref 250–450)
UIBC: 212 ug/dL

## 2019-02-01 LAB — FOLATE: Folate: 21.4 ng/mL (ref >5.9)

## 2019-02-01 LAB — LACTATE DEHYDROGENASE: LDH: 203 U/L — ABNORMAL HIGH (ref 98–192)

## 2019-02-02 LAB — KAPPA/LAMBDA LIGHT CHAINS
Kappa free light chain: 60.2 mg/L — ABNORMAL HIGH (ref 3.3–19.4)
Kappa, lambda light chain ratio: 1.53 (ref 0.26–1.65)
Lambda free light chains: 39.4 mg/L — ABNORMAL HIGH (ref 5.7–26.3)

## 2019-02-02 LAB — PROTEIN ELECTROPHORESIS, SERUM
A/G Ratio: 1.2 (ref 0.7–1.7)
Albumin ELP: 3.3 g/dL (ref 2.9–4.4)
Alpha-1-Globulin: 0.2 g/dL (ref 0.0–0.4)
Alpha-2-Globulin: 0.7 g/dL (ref 0.4–1.0)
Beta Globulin: 0.9 g/dL (ref 0.7–1.3)
Gamma Globulin: 0.9 g/dL (ref 0.4–1.8)
Globulin, Total: 2.7 g/dL (ref 2.2–3.9)
Total Protein ELP: 6 g/dL (ref 6.0–8.5)

## 2019-02-07 ENCOUNTER — Other Ambulatory Visit (HOSPITAL_COMMUNITY): Payer: Self-pay | Admitting: Nurse Practitioner

## 2019-02-07 DIAGNOSIS — C9000 Multiple myeloma not having achieved remission: Secondary | ICD-10-CM

## 2019-02-08 ENCOUNTER — Encounter (HOSPITAL_COMMUNITY): Payer: Self-pay | Admitting: Hematology

## 2019-02-08 ENCOUNTER — Inpatient Hospital Stay (HOSPITAL_COMMUNITY): Payer: BC Managed Care – PPO | Admitting: Hematology

## 2019-02-08 ENCOUNTER — Other Ambulatory Visit: Payer: Self-pay

## 2019-02-08 ENCOUNTER — Ambulatory Visit (HOSPITAL_COMMUNITY): Payer: BLUE CROSS/BLUE SHIELD | Admitting: Hematology

## 2019-02-08 VITALS — BP 130/68 | HR 65 | Temp 98.4°F | Resp 16 | Wt 378.0 lb

## 2019-02-08 DIAGNOSIS — C9000 Multiple myeloma not having achieved remission: Secondary | ICD-10-CM | POA: Diagnosis not present

## 2019-02-08 DIAGNOSIS — C9001 Multiple myeloma in remission: Secondary | ICD-10-CM | POA: Diagnosis not present

## 2019-02-08 NOTE — Progress Notes (Signed)
[name], [place] [address]   CLINIC:  Medical Oncology/Hematology  PCP:  [name], [address] [place] [address] [phone]   REASON FOR VISIT:  Follow-up formultiple myeloma  CURRENT THERAPY:Observation    INTERVAL HISTORY:  [name] 58 y.o. male seen for follow-up of multiple myeloma and normocytic anemia.  Appetite and energy levels are 100%.  Mild fatigue is stable.  Leg swellings are also stable.  Occasional diarrhea present.  Denied any nausea vomiting or constipation.  Numbness in the feet has also been stable.  Denies any bleeding per rectum or melena.  Denies any new onset bone pains.  No infections or hospitalizations since last visit.   REVIEW OF SYSTEMS:  Review of Systems  Constitutional: Positive for fatigue.  Cardiovascular: Positive for leg swelling.  Gastrointestinal: Positive for diarrhea.  Neurological: Positive for numbness.  All other systems reviewed and are negative.    PAST MEDICAL/SURGICAL HISTORY:  Past Medical History:  Diagnosis Date  . Abscess 04/2017   Scrotal  . Anemia   . Arteriosclerotic cardiovascular disease (ASCVD)    MI-2000s; stent to the proximal LAD and diagonal in 2001; stress nuclear in 2008-impaired exercise capacity, left ventricular dilatation, moderately to severely depressed EF, apical, inferior and anteroseptal scar  . Arthritis   . Atrial flutter ([place])   . 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 Cellulitis of leg    both legs  . Chronic diarrhea   . Chronic kidney disease, stage 3, mod decreased GFR (HCC)    Creatinine of 1.84 in 06/2011 and 1.5 in 07/2011  . Diabetes mellitus    Insulin  . Dysrhythmia    AFlutter  . GERD (gastroesophageal reflux disease)   . Gout   . Hyperlipidemia   . Hypertension   . Injection site reaction   . Multiple myeloma 07/01/2011  . Myocardial infarction ([place]) 2000  . Obesity   . Pedal edema    Venous insufficiency  .  Sleep apnea    uses cpap  . Ulcer    Past Surgical History:  Procedure Laterality Date  . ABSCESS DRAINAGE     Scrotal  . BIOPSY  01/02/2012   Procedure: BIOPSY;  Surgeon: Rogene Houston, MD;  [facility];  Service: Endoscopy;  Laterality: N/A;  . BONE MARROW BIOPSY  05/13/11  . CARDIAC CATHETERIZATION     cardiac stent  . CARDIOVERSION N/A 10/13/2012   Procedure: CARDIOVERSION;  Surgeon: Yehuda Savannah, MD;  Location: [facility];  Service: Cardiovascular;  Laterality: N/A;  . CATARACT EXTRACTION W/PHACO Left 02/13/2014   Procedure: CATARACT EXTRACTION PHACO AND INTRAOCULAR LENS PLACEMENT ([place]);  Surgeon: Tonny Branch, MD;  Location: [facility];  Service: Ophthalmology;  Laterality: Left;  CDE:  7.67  . CATARACT EXTRACTION W/PHACO Right 03/02/2014   Procedure: CATARACT EXTRACTION PHACO AND INTRAOCULAR LENS PLACEMENT RIGHT EYE CDE=16.81;  Surgeon: Tonny Branch, MD;  Location: [facility];  Service: Ophthalmology;  Laterality: Right;  . COLONOSCOPY  11/28/2011   Procedure: COLONOSCOPY;  Surgeon: Rogene Houston, MD;  [facility];  Service: Endoscopy;  Laterality: N/A;  930  . CYSTOSCOPY W/ RETROGRADES Right 12/16/2017   Procedure: CYSTOSCOPY WITH RIGHT RETROGRADE PYELOGRAM AND RIGHT URETERAL STENT REMOVAL;  Surgeon: Cleon Gustin, MD;  Location: [facility];  Service: Urology;  Laterality: Right;  . CYSTOSCOPY W/ URETERAL STENT PLACEMENT Right 07/31/2017   Procedure: CYSTOSCOPY WITH RETROGRADE PYELOGRAM/URETERAL STENT PLACEMENT;  Surgeon:  McKenzie, Candee Furbish, MD;  Location: [facility];  Service: Urology;  Laterality: Right;  . ESOPHAGOGASTRODUODENOSCOPY  01/02/2012   Procedure: ESOPHAGOGASTRODUODENOSCOPY (EGD);  Surgeon: Rogene Houston, MD;  [facility];  Service: Endoscopy;  Laterality: N/A;  100  . ESOPHAGOGASTRODUODENOSCOPY N/A 09/20/2012   Procedure: ESOPHAGOGASTRODUODENOSCOPY (EGD);  Surgeon: Rogene Houston, MD;  [facility];  Service: Endoscopy;  Laterality: N/A;   . EUS N/A 10/07/2012   Procedure: UPPER ENDOSCOPIC ULTRASOUND (EUS) LINEAR;  Surgeon: Milus Banister, MD;  [facility];  Service: Endoscopy;  Laterality: N/A;  . INCISION AND DRAINAGE ABSCESS N/A 04/14/2017   Procedure: INCISION AND DRAINAGE ABSCESS;  Surgeon: Ceasar Mons, MD;  [facility];  Service: Urology;  Laterality: N/A;  . INCISION AND DRAINAGE ABSCESS ANAL    . IRRIGATION AND DEBRIDEMENT ABSCESS N/A 04/15/2017   Procedure: DEBRIDEMENT SCROTAL WOUND AND DRESSING CHANGE;  Surgeon: Ceasar Mons, MD;  [facility];  Service: Urology;  Laterality: N/A;  . IRRIGATION AND DEBRIDEMENT ABSCESS N/A 04/17/2017   Procedure: IRRIGATION AND DEBRIDEMENT ABSCESS;  Surgeon: Ceasar Mons, MD;  [facility];  Service: Urology;  Laterality: N/A;  RM 3  . LAPAROSCOPIC GASTRIC BANDING  2006   has been removed  . OSTECTOMY Right 04/08/2017   Procedure: OSTECTOMY RIGHT GREAT TOE;  Surgeon: Caprice Beaver, DPM;  Location: [facility];  Service: Podiatry;  Laterality: Right;  . PORT-A-CATH REMOVAL Left 12/07/2012   Procedure: REMOVAL PORT-A-CATH;  Surgeon: Scherry Ran, MD;  Location: [facility];  Service: General;  Laterality: Left;  . PORT-A-CATH REMOVAL N/A 10/02/2017   Procedure: MINOR REMOVAL PORT-A-CATH AT BEDSIDE;  Surgeon: Donnie Mesa, MD;  Location: [facility];  Service: General;  Laterality: N/A;  . PORTACATH PLACEMENT  07/07/2011   Procedure: INSERTION PORT-A-CATH;  Surgeon: Scherry Ran, MD;  Location: [facility];  Service: General;  Laterality: N/A;  . PORTACATH PLACEMENT N/A 12/07/2012   Procedure: INSERTION PORT-A-CATH;  Surgeon: Scherry Ran, MD;  Location: [facility];  Service: General;  Laterality: N/A;  Attempted portacath placement on left and right side  . URETEROSCOPY Right 12/16/2017   Procedure: DIAGNOSTIC RIGHT URETEROSCOPY;  Surgeon: Cleon Gustin, MD;  Location: [facility];  Service: Urology;  Laterality: Right;  . WOUND  DEBRIDEMENT Right 04/08/2017   Procedure: EXCISION ULCERATION RIGHT GREAT TOE;  Surgeon: Caprice Beaver, DPM;  Location: [facility];  Service: Podiatry;  Laterality: Right;  . WRIST SURGERY     Left; removal of bone fragment     SOCIAL HISTORY:  Social History   Socioeconomic History  . Marital status: Married    Spouse name: Not on file  . Number of children: Not on file  . Years of education: 42  . Highest education level: Not on file  Occupational History  . Occupation: Nurse, learning disability  . Occupation: Optometrist: [employer]  Social Needs  . Financial resource strain: Not on file  . Food insecurity    Worry: Not on file    Inability: Not on file  . Transportation needs    Medical: Not on file    Non-medical: Not on file  Tobacco Use  . Smoking status: Former Smoker    Packs/day: 0.25    Years: 1.00    Pack years: 0.25    Types: Cigarettes, Cigars    Quit date: 05/17/2001    Years since quitting: 17.7  . Smokeless tobacco: Never Used  Substance and Sexual Activity  . Alcohol use: No    Alcohol/week: 0.0 standard drinks  . Drug use: No  . Sexual activity: Yes    Birth control/protection: None  Lifestyle  . Physical activity    Days per week: Not on file    Minutes per session: Not on file  . Stress: Not on file  Relationships  . Social Herbalist on phone: Not on file    Gets together: Not on file    Attends religious service: Not on file    Active member of club or organization: Not on file    Attends meetings of clubs or organizations: Not on file    Relationship status: Not on file  . Intimate partner violence    Fear of current or ex partner: Not on file    Emotionally abused: Not on file    Physically abused: Not on file    Forced sexual activity: Not on file  Other Topics Concern  . Not on file  Social History Narrative  . Not on file    FAMILY HISTORY:  Family History  Problem Relation Age of Onset  .  Heart disease Mother   . Cancer Mother   . Diabetes Father   . Arthritis Other   . Anesthesia problems Neg Hx   . Hypotension Neg Hx   . Malignant hyperthermia Neg Hx   . Pseudochol deficiency Neg Hx     CURRENT MEDICATIONS:  Outpatient Encounter Medications as of 02/08/2019  Medication Sig Note  . acetaminophen (TYLENOL) 500 MG tablet Take 500-1,000 mg by mouth every 6 (six) hours as needed for mild pain or moderate pain.   Marland Kitchen acyclovir (ZOVIRAX) 400 MG tablet Take 1 tablet (400 mg total) by mouth every morning.   Marland Kitchen allopurinol (ZYLOPRIM) 300 MG tablet TAKE ONE TABLET BY MOUTH ONCE DAILY.   Marland Kitchen atorvastatin (LIPITOR) 80 MG tablet TAKE ONE TABLET BY MOUTH AT BEDTIME.   . calcitRIOL (ROCALTROL) 0.25 MCG capsule Take 0.25 mcg by mouth every Monday.  12/17/2017: Last dose Monday 7/15  . Calcium Carbonate (CALCIUM 600 PO) Take 1 tablet by mouth 2 (two) times daily.   . Cholecalciferol (D2000 ULTRA STRENGTH) 50 MCG (2000 UT) CAPS Take by mouth.   . CONTOUR NEXT TEST test strip    . dicyclomine (BENTYL) 20 MG tablet TAKE 1 TABLET BY MOUTH BEFORE MEALS 3 TIMES DAILY.   Marland Kitchen Eluxadoline (VIBERZI) 100 MG TABS Take 100 mg 2 (two) times daily with a meal by mouth.    . Ferrous Gluconate (IRON 27 PO) Take by mouth.   . gabapentin (NEURONTIN) 600 MG tablet TAKE 1 TABLET BY MOUTH ONCE A DAY.   Marland Kitchen insulin detemir (LEVEMIR) 100 UNIT/ML injection Inject 0.1 mLs (10 Units total) into the skin every morning. 12/17/2017: Had 5 units this AM due to surgery  . insulin lispro (HUMALOG) 100 UNIT/ML injection Inject 0-0.1 mLs (0-10 Units total) into the skin 2 (two) times daily with a meal. Sliding Scale per patient  CBG 150-200: 1 units CBG 201-275:  3 units CBG 275-350: 5 units 351-425: 7 units 425-500: 9 units 500+: 10 units   . INSULIN SYRINGE .5CC/29G 29G X 1/2" 0.5 ML MISC    . loratadine (CLARITIN) 10 MG tablet Take 10 mg by mouth every morning.    . Magnesium Cl-Calcium Carbonate (SLOW-MAG PO) Take 1 tablet  by mouth every morning.   . Microlet Lancets MISC    . Multiple  Vitamins-Minerals (MULTIVITAMINS THER. W/MINERALS) TABS Take 1 tablet by mouth daily.    . nitroGLYCERIN (NITROSTAT) 0.4 MG SL tablet DISSOLVE 1 TABLET UNDER TONGUE EVERY 5 MINUTES UP TO 15 MIN FOR CHESTPAIN. IF NO RELIEF CALL 911.   . pantoprazole (PROTONIX) 40 MG tablet Take 40 mg by mouth daily.   . potassium chloride SA (K-DUR,KLOR-CON) 20 MEQ tablet Take 1 tablet (20 mEq total) by mouth daily. (Patient taking differently: Take 20 mEq by mouth 2 (two) times daily. )   . torsemide (DEMADEX) 20 MG tablet Take 1 tablet (20 mg total) by mouth 2 (two) times daily. Take extra 1 tab for weight gain of 3 lbs in 1 day or 5 lbs in 2 days   . XARELTO 20 MG TABS tablet TAKE 1 TABLET BY MOUTH DAILY.   Marland Kitchen Artificial Tear Ointment (DRY EYES OP) Place 1 drop into both eyes as needed (dry eyes).   . [DISCONTINUED] gabapentin (NEURONTIN) 600 MG tablet TAKE 1 TABLET BY MOUTH ONCE A DAY.   . [DISCONTINUED] INSULIN SYRINGE .5CC/29G 29G X 1/2" 0.5 ML MISC     Facility-Administered Encounter Medications as of 02/08/2019  Medication  . sodium chloride 0.9 % injection 10 mL    ALLERGIES:  Allergies  Allergen Reactions  . Beef-Derived Products Diarrhea  . Pork-Derived Products Diarrhea     PHYSICAL EXAM:  ECOG Performance status: 1  Vitals:   02/08/19 1539  BP: 130/68  Pulse: 65  Resp: 16  Temp: 98.4 F (36.9 C)  SpO2: 98%   Filed Weights   02/08/19 1539  Weight: (!) 378 lb (171.5 kg)    Physical Exam Vitals signs reviewed.  Constitutional:      Appearance: Normal appearance.  Cardiovascular:     Rate and Rhythm: Normal rate and regular rhythm.     Heart sounds: Normal heart sounds.  Pulmonary:     Effort: Pulmonary effort is normal.     Breath sounds: Normal breath sounds.  Abdominal:     General: There is no distension.     Palpations: Abdomen is soft. There is no mass.  Musculoskeletal:        General: No swelling.   Skin:    General: Skin is warm.  Neurological:     General: No focal deficit present.     Mental Status: He is alert and oriented to person, place, and time.  Psychiatric:        Mood and Affect: Mood normal.        Behavior: Behavior normal.      LABORATORY DATA:  I have reviewed the labs as listed.  CBC    Component Value Date/Time   WBC 5.9 02/01/2019 1551   RBC 4.08 (L) 02/01/2019 1551   HGB 12.8 (L) 02/01/2019 1551   HCT 40.2 02/01/2019 1551   PLT 148 (L) 02/01/2019 1551   MCV 98.5 02/01/2019 1551   MCH 31.4 02/01/2019 1551   MCHC 31.8 02/01/2019 1551   RDW 14.1 02/01/2019 1551   LYMPHSABS 2.2 02/01/2019 1551   MONOABS 0.5 02/01/2019 1551   EOSABS 0.1 02/01/2019 1551   BASOSABS 0.0 02/01/2019 1551   CMP Latest Ref Rng & Units 02/01/2019 10/01/2018 07/05/2018  Glucose 70 - 99 mg/dL 144(H) 134(H) 138(H)  BUN 6 - 20 mg/dL 55(H) 50(H) 44(H)  Creatinine 0.61 - 1.24 mg/dL 2.38(H) 2.11(H) 1.99(H)  Sodium 135 - 145 mmol/L 139 141 140  Potassium 3.5 - 5.1 mmol/L 3.8 4.3 3.9  Chloride 98 - 111  mmol/L 112(H) 111 108  CO2 22 - 32 mmol/L 19(L) 22 23  Calcium 8.9 - 10.3 mg/dL 8.5(L) 8.3(L) 8.4(L)  Total Protein 6.5 - 8.1 g/dL 6.7 6.8 -  Total Bilirubin 0.3 - 1.2 mg/dL 0.9 0.7 -  Alkaline Phos 38 - 126 U/L 106 147(H) -  AST 15 - 41 U/L 41 58(H) -  ALT 0 - 44 U/L 39 53(H) -       DIAGNOSTIC IMAGING:  I have independently reviewed the scans and discussed with the patient.   I have reviewed Venita Lick LPN's note and agree with the documentation.  I personally performed a face-to-face visit, made revisions and my assessment and plan is as follows.    ASSESSMENT & PLAN:   Multiple myeloma ([place]) 1.  Multiple myeloma: - Could not locate the type of myeloma involved, as all his immunofixation has been negative since 2017. -Maintenance [name] and dexamethasone until 2018, November, discontinued secondary to recurrent infections culminating in development of Fournier's  gangrene in November 2018.  His bone marrow biopsy was also negative prior to that. - Myeloma labs on 02/01/2019 shows negative SPEP.  Light chain ratio was normal with elevated kappa light chains and lambda light chains consistent with CKD. -His creatinine slightly worse at 2.38 but his calcium was normal. -We will continue to monitor his labs at six-month intervals.  2.  Normocytic anemia: - He has mild anemia from CKD and relative iron deficiency. - Last Feraheme was on 12/14/2017. -He is taking iron tablet every other day.  Hemoglobin is 12.8.  Ferritin was 12 and percent saturation was 30.  Q85 and folic acid were normal.   Total time spent is 25 minutes with more than 50% of the time spent face-to-face discussing surveillance plan, counseling and coordination of care.  Orders placed this encounter:  Orders Placed This Encounter  Procedures  . CBC with Differential/Platelet  . Comprehensive metabolic panel  . Protein electrophoresis, serum  . Kappa/lambda light chains  . Immunofixation electrophoresis  . Iron and TIBC  . Ferritin  . Vitamin B12  . Folate      Derek Jack, MD [place] [phone]

## 2019-02-08 NOTE — Assessment & Plan Note (Signed)
1.  Multiple myeloma: - Could not locate the type of myeloma involved, as all his immunofixation has been negative since 2017. -Maintenance [name] and dexamethasone until 2018, November, discontinued secondary to recurrent infections culminating in development of Fournier's gangrene in November 2018.  His bone marrow biopsy was also negative prior to that. - Myeloma labs on 02/01/2019 shows negative SPEP.  Light chain ratio was normal with elevated kappa light chains and lambda light chains consistent with CKD. -His creatinine slightly worse at 2.38 but his calcium was normal. -We will continue to monitor his labs at six-month intervals.  2.  Normocytic anemia: - He has mild anemia from CKD and relative iron deficiency. - Last Feraheme was on 12/14/2017. -He is taking iron tablet every other day.  Hemoglobin is 12.8.  Ferritin was 12 and percent saturation was 30.  X11 and folic acid were normal.

## 2019-02-08 NOTE — Patient Instructions (Addendum)
[facility] at [facility] Discharge Instructions  You were seen today by Dr. Katragadda. He went over your recent lab results. He will see you back in 4 months for labs and follow up.   Thank you for choosing [facility] at [facility] to provide your oncology and hematology care.  To afford each patient quality time with our provider, please arrive at least 15 minutes before your scheduled appointment time.   If you have a lab appointment with the Cancer Center please come in thru the  Main Entrance and check in at the main information desk  You need to re-schedule your appointment should you arrive 10 or more minutes late.  We strive to give you quality time with our providers, and arriving late affects you and other patients whose appointments are after yours.  Also, if you no show three or more times for appointments you may be dismissed from the clinic at the providers discretion.     Again, thank you for choosing [facility].  Our hope is that these requests will decrease the amount of time that you wait before being seen by our physicians.       _____________________________________________________________  Should you have questions after your visit to [facility], please contact our office at [phone] between the hours of 8:00 a.m. and 4:30 p.m.  Voicemails left after 4:00 p.m. will not be returned until the following business day.  For prescription refill requests, have your pharmacy contact our office and allow 72 hours.    Cancer Center Support Programs:   > Cancer Support Group  2nd Tuesday of the month 1pm-2pm, Journey Room    

## 2019-03-01 ENCOUNTER — Other Ambulatory Visit (HOSPITAL_COMMUNITY): Payer: Self-pay | Admitting: Nurse Practitioner

## 2019-03-01 ENCOUNTER — Other Ambulatory Visit: Payer: Self-pay

## 2019-03-01 DIAGNOSIS — Z20822 Contact with and (suspected) exposure to covid-19: Secondary | ICD-10-CM

## 2019-03-01 DIAGNOSIS — C9000 Multiple myeloma not having achieved remission: Secondary | ICD-10-CM

## 2019-03-02 LAB — NOVEL CORONAVIRUS, NAA: SARS-CoV-2, NAA: NOT DETECTED

## 2019-05-05 ENCOUNTER — Other Ambulatory Visit: Payer: Self-pay

## 2019-05-05 DIAGNOSIS — Z20822 Contact with and (suspected) exposure to covid-19: Secondary | ICD-10-CM

## 2019-05-09 LAB — NOVEL CORONAVIRUS, NAA: SARS-CoV-2, NAA: NOT DETECTED

## 2019-06-10 ENCOUNTER — Ambulatory Visit: Payer: BC Managed Care – PPO | Attending: Internal Medicine

## 2019-06-10 ENCOUNTER — Inpatient Hospital Stay (HOSPITAL_COMMUNITY): Payer: BC Managed Care – PPO | Attending: Hematology

## 2019-06-10 ENCOUNTER — Telehealth: Payer: Self-pay | Admitting: Family Medicine

## 2019-06-10 ENCOUNTER — Other Ambulatory Visit: Payer: Self-pay

## 2019-06-10 DIAGNOSIS — Z79899 Other long term (current) drug therapy: Secondary | ICD-10-CM | POA: Diagnosis not present

## 2019-06-10 DIAGNOSIS — C9000 Multiple myeloma not having achieved remission: Secondary | ICD-10-CM

## 2019-06-10 DIAGNOSIS — N189 Chronic kidney disease, unspecified: Secondary | ICD-10-CM | POA: Diagnosis not present

## 2019-06-10 DIAGNOSIS — D631 Anemia in chronic kidney disease: Secondary | ICD-10-CM | POA: Diagnosis not present

## 2019-06-10 DIAGNOSIS — Z20822 Contact with and (suspected) exposure to covid-19: Secondary | ICD-10-CM

## 2019-06-10 LAB — VITAMIN B12: Vitamin B-12: 704 pg/mL (ref 180–914)

## 2019-06-10 LAB — COMPREHENSIVE METABOLIC PANEL
ALT: 27 U/L (ref 0–44)
AST: 27 U/L (ref 15–41)
Albumin: 3.1 g/dL — ABNORMAL LOW (ref 3.5–5.0)
Alkaline Phosphatase: 101 U/L (ref 38–126)
Anion gap: 6 (ref 5–15)
BUN: 40 mg/dL — ABNORMAL HIGH (ref 6–20)
CO2: 23 mmol/L (ref 22–32)
Calcium: 8.3 mg/dL — ABNORMAL LOW (ref 8.9–10.3)
Chloride: 113 mmol/L — ABNORMAL HIGH (ref 98–111)
Creatinine, Ser: 2.26 mg/dL — ABNORMAL HIGH (ref 0.61–1.24)
GFR calc Af Amer: 35 mL/min — ABNORMAL LOW (ref >60)
GFR calc non Af Amer: 30 mL/min — ABNORMAL LOW (ref >60)
Glucose, Bld: 157 mg/dL — ABNORMAL HIGH (ref 70–99)
Potassium: 4.4 mmol/L (ref 3.5–5.1)
Sodium: 142 mmol/L (ref 135–145)
Total Bilirubin: 0.6 mg/dL (ref 0.3–1.2)
Total Protein: 6.5 g/dL (ref 6.5–8.1)

## 2019-06-10 LAB — IRON AND TIBC
Iron: 74 ug/dL (ref 45–182)
Saturation Ratios: 29 % (ref 17.9–39.5)
TIBC: 258 ug/dL (ref 250–450)
UIBC: 184 ug/dL

## 2019-06-10 LAB — CBC WITH DIFFERENTIAL/PLATELET
Abs Immature Granulocytes: 0.02 10*3/uL (ref 0.00–0.07)
Basophils Absolute: 0 10*3/uL (ref 0.0–0.1)
Basophils Relative: 0 %
Eosinophils Absolute: 0.2 10*3/uL (ref 0.0–0.5)
Eosinophils Relative: 3 %
HCT: 37.5 % — ABNORMAL LOW (ref 39.0–52.0)
Hemoglobin: 11.8 g/dL — ABNORMAL LOW (ref 13.0–17.0)
Immature Granulocytes: 0 %
Lymphocytes Relative: 33 %
Lymphs Abs: 1.8 10*3/uL (ref 0.7–4.0)
MCH: 32.2 pg (ref 26.0–34.0)
MCHC: 31.5 g/dL (ref 30.0–36.0)
MCV: 102.5 fL — ABNORMAL HIGH (ref 80.0–100.0)
Monocytes Absolute: 0.5 10*3/uL (ref 0.1–1.0)
Monocytes Relative: 9 %
Neutro Abs: 3.1 10*3/uL (ref 1.7–7.7)
Neutrophils Relative %: 55 %
Platelets: 188 10*3/uL (ref 150–400)
RBC: 3.66 MIL/uL — ABNORMAL LOW (ref 4.22–5.81)
RDW: 13.5 % (ref 11.5–15.5)
WBC: 5.7 10*3/uL (ref 4.0–10.5)
nRBC: 0 % (ref 0.0–0.2)

## 2019-06-10 LAB — FERRITIN: Ferritin: 150 ng/mL (ref 24–336)

## 2019-06-10 LAB — FOLATE: Folate: 20.3 ng/mL (ref >5.9)

## 2019-06-10 NOTE — Telephone Encounter (Signed)
Spoke with [facility] site lead regarding patient request. Patient advised to make make site members aware of request on arrival.

## 2019-06-10 NOTE — Telephone Encounter (Signed)
Pt stated he was not aware that he had to go inside now for testing. He has ambulatory issues and is able to use a walker or wheelchair but he would like to know if it possible to have his test administered in the car due this. CB#[phone]

## 2019-06-12 LAB — NOVEL CORONAVIRUS, NAA: SARS-CoV-2, NAA: NOT DETECTED

## 2019-06-13 LAB — KAPPA/LAMBDA LIGHT CHAINS
Kappa free light chain: 63.3 mg/L — ABNORMAL HIGH (ref 3.3–19.4)
Kappa, lambda light chain ratio: 1.46 (ref 0.26–1.65)
Lambda free light chains: 43.4 mg/L — ABNORMAL HIGH (ref 5.7–26.3)

## 2019-06-13 LAB — PROTEIN ELECTROPHORESIS, SERUM
A/G Ratio: 1 (ref 0.7–1.7)
Albumin ELP: 2.8 g/dL — ABNORMAL LOW (ref 2.9–4.4)
Alpha-1-Globulin: 0.2 g/dL (ref 0.0–0.4)
Alpha-2-Globulin: 0.8 g/dL (ref 0.4–1.0)
Beta Globulin: 1 g/dL (ref 0.7–1.3)
Gamma Globulin: 0.8 g/dL (ref 0.4–1.8)
Globulin, Total: 2.8 g/dL (ref 2.2–3.9)
Total Protein ELP: 5.6 g/dL — ABNORMAL LOW (ref 6.0–8.5)

## 2019-06-14 LAB — IMMUNOFIXATION ELECTROPHORESIS
IgA: 182 mg/dL (ref 90–386)
IgG (Immunoglobin G), Serum: 823 mg/dL (ref 603–1613)
IgM (Immunoglobulin M), Srm: 238 mg/dL — ABNORMAL HIGH (ref 20–172)
Total Protein ELP: 5.7 g/dL — ABNORMAL LOW (ref 6.0–8.5)

## 2019-06-20 ENCOUNTER — Other Ambulatory Visit: Payer: Self-pay

## 2019-06-20 ENCOUNTER — Encounter (HOSPITAL_COMMUNITY): Payer: Self-pay | Admitting: Hematology

## 2019-06-20 ENCOUNTER — Inpatient Hospital Stay (HOSPITAL_BASED_OUTPATIENT_CLINIC_OR_DEPARTMENT_OTHER): Payer: BC Managed Care – PPO | Admitting: Hematology

## 2019-06-20 DIAGNOSIS — C9000 Multiple myeloma not having achieved remission: Secondary | ICD-10-CM

## 2019-06-20 DIAGNOSIS — D649 Anemia, unspecified: Secondary | ICD-10-CM

## 2019-06-20 NOTE — Progress Notes (Signed)
Virtual Visit via Telephone Note  I connected with [name] on 06/20/19 at  3:50 PM EST by telephone and verified that I am speaking with the correct person using two identifiers.   I discussed the limitations, risks, security and privacy concerns of performing an evaluation and management service by telephone and the availability of in person appointments. I also discussed with the patient that there may be a patient responsible charge related to this service. The patient expressed understanding and agreed to proceed.   History of Present Illness: He is seen in our clinic for multiple myeloma and a normocytic anemia secondary to CKD.  His maintenance [name] treatment was discontinued in November 2018 secondary to recurrent infections culminating in development of Fournier's gangrene.   Observations/Objective: He denies any new onset bone pains.  Denies any fevers, night sweats or weight loss.  Denies any recurrent infections.  Denies any recent ER visits or hospitalizations.  Numbness in the feet has been stable.  Appetite and energy levels are 100%.  Chronic diarrhea is also stable.  Assessment and Plan:  1.  Multiple myeloma: -Immunofixation has been negative since 2017. -We reviewed recent labs from 06/10/2019.  SPEP and immunofixation were negative.  Free light chains ratio was normal at 1.46.  Kappa free light chains are elevated at 63.3 and lambda light chains are 43.4. -His calcium was normal.  Creatinine is stable at 2.26. -We will repeat labs in 6 months for follow-up.  2.  Macrocytic anemia: -He has mild anemia from CKD and relative iron deficiency. -Last Feraheme was on 12/14/2017. -He is taking iron tablet every other day. -Labs today showed hemoglobin 11.8.  Ferritin was 150 with percent saturation of 29.  Folic acid and K43 was normal. -He was told to continue iron every other day.   Follow Up Instructions:    I discussed the assessment and treatment plan with  the patient. The patient was provided an opportunity to ask questions and all were answered. The patient agreed with the plan and demonstrated an understanding of the instructions.   The patient was advised to call back or seek an in-person evaluation if the symptoms worsen or if the condition fails to improve as anticipated.  I provided 11 minutes of non-face-to-face time during this encounter.   Derek Jack, MD

## 2019-06-24 ENCOUNTER — Telehealth: Payer: Self-pay | Admitting: Cardiovascular Disease

## 2019-06-24 NOTE — Telephone Encounter (Signed)

## 2019-06-27 ENCOUNTER — Telehealth: Payer: BC Managed Care – PPO | Admitting: Cardiovascular Disease

## 2019-07-01 ENCOUNTER — Encounter: Payer: Self-pay | Admitting: Cardiovascular Disease

## 2019-07-01 ENCOUNTER — Telehealth (INDEPENDENT_AMBULATORY_CARE_PROVIDER_SITE_OTHER): Payer: BC Managed Care – PPO | Admitting: Cardiovascular Disease

## 2019-07-01 VITALS — Ht 74.0 in | Wt 376.0 lb

## 2019-07-01 DIAGNOSIS — I25118 Atherosclerotic heart disease of native coronary artery with other forms of angina pectoris: Secondary | ICD-10-CM

## 2019-07-01 DIAGNOSIS — Z955 Presence of coronary angioplasty implant and graft: Secondary | ICD-10-CM

## 2019-07-01 DIAGNOSIS — I5022 Chronic systolic (congestive) heart failure: Secondary | ICD-10-CM | POA: Diagnosis not present

## 2019-07-01 DIAGNOSIS — N184 Chronic kidney disease, stage 4 (severe): Secondary | ICD-10-CM

## 2019-07-01 DIAGNOSIS — I4892 Unspecified atrial flutter: Secondary | ICD-10-CM

## 2019-07-01 DIAGNOSIS — I10 Essential (primary) hypertension: Secondary | ICD-10-CM

## 2019-07-01 NOTE — Progress Notes (Signed)
Virtual Visit via Telephone Note   This visit type was conducted due to national recommendations for restrictions regarding the COVID-19 Pandemic (e.g. social distancing) in an effort to limit this patient's exposure and mitigate transmission in our community.  Due to his co-morbid illnesses, this patient is at least at moderate risk for complications without adequate follow up.  This format is felt to be most appropriate for this patient at this time.  The patient did not have access to video technology/had technical difficulties with video requiring transitioning to audio format only (telephone).  All issues noted in this document were discussed and addressed.  No physical exam could be performed with this format.  Please refer to the patient's chart for his  consent to telehealth for [facility].   Date:  07/01/2019   ID:  [name], DOB [birth year], MRN [id number]  Patient Location: Home Provider Location: Office  PCP:  [name], MD  Cardiologist:  Kate Sable, MD  Electrophysiologist:  None   Evaluation Performed:  Follow-Up Visit  Chief Complaint:  CAD, CHF  History of Present Illness:    [name] is a 59 y.o. male with coronary artery disease, chronic systolic heart failure, and atrial flutter.  He also has multiple myeloma, chronic kidney disease, sleep apnea, hypertension, and macrocytic anemia.     Past Medical History:  Diagnosis Date  . Abscess 04/2017   Scrotal  . Anemia   . Arteriosclerotic cardiovascular disease (ASCVD)    MI-2000s; stent to the proximal LAD and diagonal in 2001; stress nuclear in 2008-impaired exercise capacity, left ventricular dilatation, moderately to severely depressed EF, apical, inferior and anteroseptal scar  . Arthritis   . Atrial flutter ([place])   . 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 Cellulitis of leg    both legs  . Chronic diarrhea   . Chronic kidney disease, stage 3, mod decreased GFR    Creatinine of  1.84 in 06/2011 and 1.5 in 07/2011  . Diabetes mellitus    Insulin  . Dysrhythmia    AFlutter  . GERD (gastroesophageal reflux disease)   . Gout   . Hyperlipidemia   . Hypertension   . Injection site reaction   . Multiple myeloma 07/01/2011  . Myocardial infarction ([place]) 2000  . Obesity   . Pedal edema    Venous insufficiency  . Sleep apnea    uses cpap  . Ulcer    Past Surgical History:  Procedure Laterality Date  . ABSCESS DRAINAGE     Scrotal  . BIOPSY  01/02/2012   Procedure: BIOPSY;  Surgeon: Rogene Houston, MD;  [facility];  Service: Endoscopy;  Laterality: N/A;  . BONE MARROW BIOPSY  05/13/11  . CARDIAC CATHETERIZATION     cardiac stent  . CARDIOVERSION N/A 10/13/2012   Procedure: CARDIOVERSION;  Surgeon: Yehuda Savannah, MD;  Location: [facility];  Service: Cardiovascular;  Laterality: N/A;  . CATARACT EXTRACTION W/PHACO Left 02/13/2014   Procedure: CATARACT EXTRACTION PHACO AND INTRAOCULAR LENS PLACEMENT ([place]);  Surgeon: Tonny Branch, MD;  Location: [facility];  Service: Ophthalmology;  Laterality: Left;  CDE:  7.67  . CATARACT EXTRACTION W/PHACO Right 03/02/2014   Procedure: CATARACT EXTRACTION PHACO AND INTRAOCULAR LENS PLACEMENT RIGHT EYE CDE=16.81;  Surgeon: Tonny Branch, MD;  Location: [facility];  Service: Ophthalmology;  Laterality: Right;  . COLONOSCOPY  11/28/2011   Procedure: COLONOSCOPY;  Surgeon: Rogene Houston, MD;  [facility];  Service: Endoscopy;  Laterality: N/A;  930  . CYSTOSCOPY W/ RETROGRADES Right 12/16/2017   Procedure: CYSTOSCOPY WITH RIGHT RETROGRADE PYELOGRAM AND RIGHT URETERAL STENT REMOVAL;  Surgeon: Cleon Gustin, MD;  Location: [facility];  Service: Urology;  Laterality: Right;  . CYSTOSCOPY W/ URETERAL STENT PLACEMENT Right 07/31/2017   Procedure: CYSTOSCOPY WITH RETROGRADE PYELOGRAM/URETERAL STENT PLACEMENT;  Surgeon: Cleon Gustin, MD;  [facility]: [facility];  Service: Urology;  Laterality: Right;  . ESOPHAGOGASTRODUODENOSCOPY   01/02/2012   Procedure: ESOPHAGOGASTRODUODENOSCOPY (EGD);  Surgeon: Rogene Houston, MD;  [facility];  Service: Endoscopy;  Laterality: N/A;  100  . ESOPHAGOGASTRODUODENOSCOPY N/A 09/20/2012   Procedure: ESOPHAGOGASTRODUODENOSCOPY (EGD);  Surgeon: Rogene Houston, MD;  [facility];  Service: Endoscopy;  Laterality: N/A;  . EUS N/A 10/07/2012   Procedure: UPPER ENDOSCOPIC ULTRASOUND (EUS) LINEAR;  Surgeon: Milus Banister, MD;  [facility];  Service: Endoscopy;  Laterality: N/A;  . INCISION AND DRAINAGE ABSCESS N/A 04/14/2017   Procedure: INCISION AND DRAINAGE ABSCESS;  Surgeon: Ceasar Mons, MD;  [facility];  Service: Urology;  Laterality: N/A;  . INCISION AND DRAINAGE ABSCESS ANAL    . IRRIGATION AND DEBRIDEMENT ABSCESS N/A 04/15/2017   Procedure: DEBRIDEMENT SCROTAL WOUND AND DRESSING CHANGE;  Surgeon: Ceasar Mons, MD;  [facility];  Service: Urology;  Laterality: N/A;  . IRRIGATION AND DEBRIDEMENT ABSCESS N/A 04/17/2017   Procedure: IRRIGATION AND DEBRIDEMENT ABSCESS;  Surgeon: Ceasar Mons, MD;  [facility];  Service: Urology;  Laterality: N/A;  RM 3  . LAPAROSCOPIC GASTRIC BANDING  2006   has been removed  . OSTECTOMY Right 04/08/2017   Procedure: OSTECTOMY RIGHT GREAT TOE;  Surgeon: Caprice Beaver, DPM;  [facility];  Service: Podiatry;  Laterality: Right;  . PORT-A-CATH REMOVAL Left 12/07/2012   Procedure: REMOVAL PORT-A-CATH;  Surgeon: Scherry Ran, MD;  [facility];  Service: General;  Laterality: Left;  . PORT-A-CATH REMOVAL N/A 10/02/2017   Procedure: MINOR REMOVAL PORT-A-CATH AT BEDSIDE;  Surgeon: Donnie Mesa, MD;  Location: [facility];  Service: General;  Laterality: N/A;  . PORTACATH PLACEMENT  07/07/2011   Procedure: INSERTION PORT-A-CATH;  Surgeon: Scherry Ran, MD;  [facility];  Service: General;  Laterality: N/A;  . PORTACATH PLACEMENT N/A 12/07/2012   Procedure:  INSERTION PORT-A-CATH;  Surgeon: Scherry Ran, MD;  [facility]: [facility];  Service: General;  Laterality: N/A;  Attempted portacath placement on left and right side  . URETEROSCOPY Right 12/16/2017   Procedure: DIAGNOSTIC RIGHT URETEROSCOPY;  Surgeon: Cleon Gustin, MD;  [facility];  Service: Urology;  Laterality: Right;  . WOUND DEBRIDEMENT Right 04/08/2017   Procedure: EXCISION ULCERATION RIGHT GREAT TOE;  Surgeon: Caprice Beaver, DPM;  Location: [facility];  Service: Podiatry;  Laterality: Right;  . WRIST SURGERY     Left; removal of bone fragment     Current Meds  Medication Sig  . acetaminophen (TYLENOL) 500 MG tablet Take 500-1,000 mg by mouth every 6 (six) hours as needed for mild pain or moderate pain.  Marland Kitchen acyclovir (ZOVIRAX) 400 MG tablet Take 1 tablet (400 mg total) by mouth every morning.  Marland Kitchen allopurinol (ZYLOPRIM) 300 MG tablet TAKE ONE TABLET BY MOUTH ONCE DAILY.  Marland Kitchen Artificial Tear Ointment (DRY EYES OP) Place 1 drop into both eyes as needed (dry eyes).  Marland Kitchen atorvastatin (LIPITOR) 80 MG tablet TAKE ONE TABLET BY MOUTH AT BEDTIME.  . calcitRIOL (ROCALTROL) 0.25 MCG capsule Take 0.25 mcg by mouth  every Monday.   . Cholecalciferol (D2000 ULTRA STRENGTH) 50 MCG (2000 UT) CAPS Take by mouth.  . CONTOUR NEXT TEST test strip   . dicyclomine (BENTYL) 20 MG tablet TAKE 1 TABLET BY MOUTH BEFORE MEALS 3 TIMES DAILY.  Marland Kitchen Eluxadoline (VIBERZI) 100 MG TABS Take 100 mg 2 (two) times daily with a meal by mouth.   . ferrous sulfate 325 (65 FE) MG EC tablet Take 45 mg by mouth. Every other day  . gabapentin (NEURONTIN) 600 MG tablet TAKE 1 TABLET BY MOUTH ONCE A DAY. (Patient taking differently: Take 600 mg by mouth 2 (two) times daily. Pt takes 1 tablet in a.m. and 1/2 tablet at bedtime)  . insulin aspart (NOVOLOG) 100 UNIT/ML injection Inject into the skin. Sliding scale over 150= 1 unit, 200= 2 units,250= 3 units  . insulin detemir (LEVEMIR) 100 UNIT/ML injection Inject 0.1 mLs (10 Units  total) into the skin every morning.  . INSULIN SYRINGE .5CC/29G 29G X 1/2" 0.5 ML MISC   . loratadine (CLARITIN) 10 MG tablet Take 10 mg by mouth every morning.   . Magnesium Cl-Calcium Carbonate (SLOW-MAG PO) Take 1 tablet by mouth every morning.  . Microlet Lancets MISC   . Multiple Vitamins-Minerals (MULTIVITAMINS THER. W/MINERALS) TABS Take 1 tablet by mouth daily.   . nitroGLYCERIN (NITROSTAT) 0.4 MG SL tablet DISSOLVE 1 TABLET UNDER TONGUE EVERY 5 MINUTES UP TO 15 MIN FOR CHESTPAIN. IF NO RELIEF CALL 911.  . pantoprazole (PROTONIX) 40 MG tablet Take 40 mg by mouth daily.  . potassium chloride SA (KLOR-CON) 20 MEQ tablet Take 20 mEq by mouth 2 (two) times daily.  . sodium bicarbonate 650 MG tablet Take 650 mg by mouth 3 (three) times daily.  . SURE COMFORT PEN NEEDLES 29G X 12.7MM MISC USE AS DIRECTED FOURCTIMES DAILY.  Marland Kitchen torsemide (DEMADEX) 10 MG tablet Take 10 mg by mouth 2 (two) times daily.  Alveda Reasons 20 MG TABS tablet TAKE 1 TABLET BY MOUTH DAILY.     Allergies:   Beef-derived products and Pork-derived products   Social History   Tobacco Use  . Smoking status: Former Smoker    Packs/day: 0.25    Years: 1.00    Pack years: 0.25    Types: Cigarettes, Cigars    Quit date: 05/17/2001    Years since quitting: 18.1  . Smokeless tobacco: Never Used  Substance Use Topics  . Alcohol use: No    Alcohol/week: 0.0 standard drinks  . Drug use: No     Family Hx: The patient's family history includes Arthritis in an other family member; Cancer in his mother; Diabetes in his father; Heart disease in his mother. There is no history of Anesthesia problems, Hypotension, Malignant hyperthermia, or Pseudochol deficiency.  ROS:   Please see the history of present illness.     All other systems reviewed and are negative.   Prior CV studies:   The following studies were reviewed today:  Most recent echocardiogram performed on 10/04/2017 showed moderate to severely reduced left  ventricular systolic function, LVEF 30 to 35%, severe left ventricular dilatation, diffuse hypokinesis, grade 2 diastolic dysfunction with high ventricular filling pressures, and reduced right ventricular systolic function.  There was severe left atrial dilatation.  Labs/Other Tests and Data Reviewed:    EKG:  No ECG reviewed.  Recent Labs: 06/10/2019: ALT 27; BUN 40; Creatinine, Ser 2.26; Hemoglobin 11.8; Platelets 188; Potassium 4.4; Sodium 142   Recent Lipid Panel Lab Results  Component Value Date/Time  CHOL 158 07/18/2010 05:24 PM   TRIG 173 07/18/2010 05:24 PM   HDL 33 07/18/2010 05:24 PM   LDLCALC 90 07/18/2010 05:24 PM    Wt Readings from Last 3 Encounters:  07/01/19 (!) 376 lb (170.6 kg)  02/08/19 (!) 378 lb (171.5 kg)  10/08/18 (!) 367 lb 12.8 oz (166.8 kg)     Objective:    Vital Signs:  Ht '6\' 2"'  (1.88 m)   Wt (!) 376 lb (170.6 kg)   BMI 48.28 kg/m    VITAL SIGNS:  reviewed  ASSESSMENT & PLAN:    1. CAD with prior MI and stents: Symptomatically stable.  Continue Lipitor. No aspirin as he is on Xarelto.  Metoprolol was previously discontinued due to symptomatic bradycardia and fatigue as noted above.    2. Ischemic cardiomyopathy/chronic systolic heart failure:Euvolemic on torsemide 10 mg twice daily. No changes to therapy.    No beta-blockers due to history of symptomatic bradycardia and fatigue.  No ACE inhibitor's or angiotensin receptor blockers due to advanced chronic kidney disease.  He wears compression stockings and uses lymphedema pumps. LVEF now 30 to 35%.  I will obtain a follow-up echocardiogram.  If LVEF remains severely reduced, I will make a referral to EP for ICD consideration.  3. Hypertension:No changes.  4. Atrial flutter:Symptomatically stable. Continue Xarelto for anticoagulation.  5.  Chronic kidney disease stage III/IV: Creatinine 2.26 on 06/10/2019.   COVID-19 Education: The signs and symptoms of COVID-19 were discussed with the  patient and how to seek care for testing (follow up with PCP or arrange E-visit).  The importance of social distancing was discussed today.  Time:   Today, I have spent 15 minutes with the patient with telehealth technology discussing the above problems.     Medication Adjustments/Labs and Tests Ordered: Current medicines are reviewed at length with the patient today.  Concerns regarding medicines are outlined above.   Tests Ordered: No orders of the defined types were placed in this encounter.   Medication Changes: No orders of the defined types were placed in this encounter.   Follow Up:  In Person in 1 year(s)  Signed, Kate Sable, MD  07/01/2019 1:10 PM    [place]

## 2019-07-01 NOTE — Patient Instructions (Signed)
Medication Instructions:  Your physician recommends that you continue on your current medications as directed. Please refer to the Current Medication list given to you today.   Labwork: none  Testing/Procedures: Your physician has requested that you have an echocardiogram. Echocardiography is a painless test that uses sound waves to create images of your heart. It provides your doctor with information about the size and shape of your heart and how well your heart's chambers and valves are working. This procedure takes approximately one hour. There are no restrictions for this procedure.     Follow-Up: Your physician wants you to follow-up in: 1 year. You will receive a reminder letter in the mail two months in advance. If you don't receive a letter, please call our office to schedule the follow-up appointment.   Any Other Special Instructions Will Be Listed Below (If Applicable).     If you need a refill on your cardiac medications before your next appointment, please call your pharmacy.

## 2019-07-01 NOTE — Addendum Note (Signed)
Addended byDebbora Lacrosse R on: 07/01/2019 01:40 PM   Modules accepted: Orders

## 2019-07-08 ENCOUNTER — Ambulatory Visit (HOSPITAL_COMMUNITY): Payer: BC Managed Care – PPO | Attending: Cardiovascular Disease

## 2019-07-21 ENCOUNTER — Ambulatory Visit (HOSPITAL_COMMUNITY): Payer: BC Managed Care – PPO

## 2019-07-26 ENCOUNTER — Other Ambulatory Visit (HOSPITAL_COMMUNITY): Payer: BC Managed Care – PPO

## 2019-07-26 ENCOUNTER — Other Ambulatory Visit: Payer: Self-pay

## 2019-07-26 ENCOUNTER — Ambulatory Visit (HOSPITAL_COMMUNITY)
Admission: RE | Admit: 2019-07-26 | Discharge: 2019-07-26 | Disposition: A | Discharge status: Home / Self Care | Payer: BC Managed Care – PPO | Source: Ambulatory Visit | Attending: Cardiovascular Disease | Admitting: Cardiovascular Disease

## 2019-07-26 DIAGNOSIS — I5022 Chronic systolic (congestive) heart failure: Secondary | ICD-10-CM | POA: Insufficient documentation

## 2019-07-26 MED ORDER — PERFLUTREN LIPID MICROSPHERE
1.0000 mL | INTRAVENOUS | Status: AC | PRN
  Administered 2019-07-26: 1 mL via INTRAVENOUS
  Filled 2019-07-26: qty 10

## 2019-07-26 NOTE — Progress Notes (Signed)
*  PRELIMINARY RESULTS* Echocardiogram 2D Echocardiogram has been performed.  Leavy Cella 07/26/2019, 4:32 PM

## 2019-08-06 ENCOUNTER — Other Ambulatory Visit: Payer: Self-pay | Admitting: Nurse Practitioner

## 2019-10-25 ENCOUNTER — Other Ambulatory Visit: Payer: Self-pay | Admitting: Cardiovascular Disease

## 2019-12-13 ENCOUNTER — Other Ambulatory Visit: Payer: Self-pay

## 2019-12-13 ENCOUNTER — Inpatient Hospital Stay (HOSPITAL_COMMUNITY): Payer: BC Managed Care – PPO | Attending: Hematology

## 2019-12-13 DIAGNOSIS — Z87891 Personal history of nicotine dependence: Secondary | ICD-10-CM | POA: Insufficient documentation

## 2019-12-13 DIAGNOSIS — D649 Anemia, unspecified: Secondary | ICD-10-CM

## 2019-12-13 DIAGNOSIS — C9000 Multiple myeloma not having achieved remission: Secondary | ICD-10-CM | POA: Diagnosis not present

## 2019-12-13 DIAGNOSIS — N189 Chronic kidney disease, unspecified: Secondary | ICD-10-CM | POA: Insufficient documentation

## 2019-12-13 DIAGNOSIS — D509 Iron deficiency anemia, unspecified: Secondary | ICD-10-CM | POA: Insufficient documentation

## 2019-12-13 DIAGNOSIS — D631 Anemia in chronic kidney disease: Secondary | ICD-10-CM | POA: Insufficient documentation

## 2019-12-13 LAB — CBC WITH DIFFERENTIAL/PLATELET
Abs Immature Granulocytes: 0.02 10*3/uL (ref 0.00–0.07)
Basophils Absolute: 0 10*3/uL (ref 0.0–0.1)
Basophils Relative: 0 %
Eosinophils Absolute: 0.2 10*3/uL (ref 0.0–0.5)
Eosinophils Relative: 3 %
HCT: 37.4 % — ABNORMAL LOW (ref 39.0–52.0)
Hemoglobin: 11.6 g/dL — ABNORMAL LOW (ref 13.0–17.0)
Immature Granulocytes: 0 %
Lymphocytes Relative: 33 %
Lymphs Abs: 2.3 10*3/uL (ref 0.7–4.0)
MCH: 30.9 pg (ref 26.0–34.0)
MCHC: 31 g/dL (ref 30.0–36.0)
MCV: 99.7 fL (ref 80.0–100.0)
Monocytes Absolute: 0.6 10*3/uL (ref 0.1–1.0)
Monocytes Relative: 9 %
Neutro Abs: 3.7 10*3/uL (ref 1.7–7.7)
Neutrophils Relative %: 55 %
Platelets: 163 10*3/uL (ref 150–400)
RBC: 3.75 MIL/uL — ABNORMAL LOW (ref 4.22–5.81)
RDW: 14.6 % (ref 11.5–15.5)
WBC: 6.9 10*3/uL (ref 4.0–10.5)
nRBC: 0 % (ref 0.0–0.2)

## 2019-12-13 LAB — IRON AND TIBC
Iron: 73 ug/dL (ref 45–182)
Saturation Ratios: 25 % (ref 17.9–39.5)
TIBC: 288 ug/dL (ref 250–450)
UIBC: 215 ug/dL

## 2019-12-13 LAB — COMPREHENSIVE METABOLIC PANEL
ALT: 33 U/L (ref 0–44)
AST: 31 U/L (ref 15–41)
Albumin: 3.8 g/dL (ref 3.5–5.0)
Alkaline Phosphatase: 115 U/L (ref 38–126)
Anion gap: 11 (ref 5–15)
BUN: 75 mg/dL — ABNORMAL HIGH (ref 6–20)
CO2: 22 mmol/L (ref 22–32)
Calcium: 8.8 mg/dL — ABNORMAL LOW (ref 8.9–10.3)
Chloride: 107 mmol/L (ref 98–111)
Creatinine, Ser: 2.78 mg/dL — ABNORMAL HIGH (ref 0.61–1.24)
GFR calc Af Amer: 27 mL/min — ABNORMAL LOW (ref >60)
GFR calc non Af Amer: 24 mL/min — ABNORMAL LOW (ref >60)
Glucose, Bld: 131 mg/dL — ABNORMAL HIGH (ref 70–99)
Potassium: 4.6 mmol/L (ref 3.5–5.1)
Sodium: 140 mmol/L (ref 135–145)
Total Bilirubin: 0.5 mg/dL (ref 0.3–1.2)
Total Protein: 7.3 g/dL (ref 6.5–8.1)

## 2019-12-13 LAB — FERRITIN: Ferritin: 158 ng/mL (ref 24–336)

## 2019-12-13 LAB — VITAMIN B12: Vitamin B-12: 776 pg/mL (ref 180–914)

## 2019-12-14 LAB — PROTEIN ELECTROPHORESIS, SERUM
A/G Ratio: 1.2 (ref 0.7–1.7)
Albumin ELP: 3.6 g/dL (ref 2.9–4.4)
Alpha-1-Globulin: 0.3 g/dL (ref 0.0–0.4)
Alpha-2-Globulin: 0.8 g/dL (ref 0.4–1.0)
Beta Globulin: 1 g/dL (ref 0.7–1.3)
Gamma Globulin: 0.9 g/dL (ref 0.4–1.8)
Globulin, Total: 2.9 g/dL (ref 2.2–3.9)
Total Protein ELP: 6.5 g/dL (ref 6.0–8.5)

## 2019-12-14 LAB — KAPPA/LAMBDA LIGHT CHAINS
Kappa free light chain: 85.2 mg/L — ABNORMAL HIGH (ref 3.3–19.4)
Kappa, lambda light chain ratio: 1.65 (ref 0.26–1.65)
Lambda free light chains: 51.6 mg/L — ABNORMAL HIGH (ref 5.7–26.3)

## 2019-12-15 LAB — IMMUNOFIXATION ELECTROPHORESIS
IgA: 169 mg/dL (ref 90–386)
IgG (Immunoglobin G), Serum: 874 mg/dL (ref 603–1613)
IgM (Immunoglobulin M), Srm: 264 mg/dL — ABNORMAL HIGH (ref 20–172)
Total Protein ELP: 6.7 g/dL (ref 6.0–8.5)

## 2019-12-20 ENCOUNTER — Ambulatory Visit (HOSPITAL_COMMUNITY): Payer: BC Managed Care – PPO | Admitting: Hematology

## 2019-12-28 ENCOUNTER — Encounter (HOSPITAL_COMMUNITY): Payer: Self-pay | Admitting: Hematology

## 2019-12-28 ENCOUNTER — Other Ambulatory Visit: Payer: Self-pay

## 2019-12-28 ENCOUNTER — Inpatient Hospital Stay (HOSPITAL_COMMUNITY): Payer: BC Managed Care – PPO | Admitting: Hematology

## 2019-12-28 VITALS — BP 147/60 | HR 64 | Temp 97.3°F | Resp 20 | Wt 385.2 lb

## 2019-12-28 DIAGNOSIS — D649 Anemia, unspecified: Secondary | ICD-10-CM

## 2019-12-28 DIAGNOSIS — C9000 Multiple myeloma not having achieved remission: Secondary | ICD-10-CM | POA: Diagnosis not present

## 2019-12-28 NOTE — Progress Notes (Signed)
[address] [place], [place] [address]   CLINIC:  Medical Oncology/Hematology  PCP:  [name], [address] / [place] [address]  [phone]  REASON FOR VISIT:  Follow-up for multiple myeloma  CURRENT THERAPY: Observation  INTERVAL HISTORY:  [name], a 59 y.o. male, returns for routine follow-up for his multiple myeloma. [name] was last seen on 06/20/2019 via telephone.  He has been feeling well. He has been following up with [name] in nephrology as required. He has been trying to work on his diet so he doesn't expel as much; he notes that when he loses a lot of fluid, he tends to have numbness in his feet.   REVIEW OF SYSTEMS:  Review of Systems  Constitutional: Negative.   HENT:  Negative.   Eyes: Negative.   Respiratory: Negative.   Cardiovascular: Negative.   Gastrointestinal: Negative.   Endocrine: Negative.   Genitourinary: Negative.    Musculoskeletal: Negative.   Skin: Negative.   Neurological: Negative.   Hematological: Negative.   Psychiatric/Behavioral: Negative.   All other systems reviewed and are negative.   PAST MEDICAL/SURGICAL HISTORY:  Past Medical History:  Diagnosis Date  . Abscess 04/2017   Scrotal  . Anemia   . Arteriosclerotic cardiovascular disease (ASCVD)    MI-2000s; stent to the proximal LAD and diagonal in 2001; stress nuclear in 2008-impaired exercise capacity, left ventricular dilatation, moderately to severely depressed EF, apical, inferior and anteroseptal scar  . Arthritis   . Atrial flutter ([place])   . 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 Cellulitis of leg    both legs  . Chronic diarrhea   . Chronic kidney disease, stage 3, mod decreased GFR    Creatinine of 1.84 in 06/2011 and 1.5 in 07/2011  . Diabetes mellitus    Insulin  . Dysrhythmia    AFlutter  . GERD (gastroesophageal reflux disease)   . Gout   . Hyperlipidemia   . Hypertension   . Injection site  reaction   . Multiple myeloma 07/01/2011  . Myocardial infarction ([place]) 2000  . Obesity   . Pedal edema    Venous insufficiency  . Sleep apnea    uses cpap  . Ulcer    Past Surgical History:  Procedure Laterality Date  . ABSCESS DRAINAGE     Scrotal  . BIOPSY  01/02/2012   Procedure: BIOPSY;  Surgeon: Rogene Houston, MD;  [facility];  Service: Endoscopy;  Laterality: N/A;  . BONE MARROW BIOPSY  05/13/11  . CARDIAC CATHETERIZATION     cardiac stent  . CARDIOVERSION N/A 10/13/2012   Procedure: CARDIOVERSION;  Surgeon: Yehuda Savannah, MD;  Location: [facility];  Service: Cardiovascular;  Laterality: N/A;  . CATARACT EXTRACTION W/PHACO Left 02/13/2014   Procedure: CATARACT EXTRACTION PHACO AND INTRAOCULAR LENS PLACEMENT ([place]);  Surgeon: Tonny Branch, MD;  Location: [facility];  Service: Ophthalmology;  Laterality: Left;  CDE:  7.67  . CATARACT EXTRACTION W/PHACO Right 03/02/2014   Procedure: CATARACT EXTRACTION PHACO AND INTRAOCULAR LENS PLACEMENT RIGHT EYE CDE=16.81;  Surgeon: Tonny Branch, MD;  Location: [facility];  Service: Ophthalmology;  Laterality: Right;  . COLONOSCOPY  11/28/2011   Procedure: COLONOSCOPY;  Surgeon: Rogene Houston, MD;  [facility];  Service: Endoscopy;  Laterality: N/A;  930  . CYSTOSCOPY W/ RETROGRADES Right 12/16/2017   Procedure: CYSTOSCOPY WITH RIGHT RETROGRADE PYELOGRAM AND RIGHT URETERAL STENT REMOVAL;  Surgeon: Nicolette Bang  L, MD;  Location: [facility];  Service: Urology;  Laterality: Right;  . CYSTOSCOPY W/ URETERAL STENT PLACEMENT Right 07/31/2017   Procedure: CYSTOSCOPY WITH RETROGRADE PYELOGRAM/URETERAL STENT PLACEMENT;  Surgeon: Cleon Gustin, MD;  [facility];  Service: Urology;  Laterality: Right;  . ESOPHAGOGASTRODUODENOSCOPY  01/02/2012   Procedure: ESOPHAGOGASTRODUODENOSCOPY (EGD);  Surgeon: Rogene Houston, MD;  [facility];  Service: Endoscopy;  Laterality: N/A;  100  . ESOPHAGOGASTRODUODENOSCOPY N/A 09/20/2012    Procedure: ESOPHAGOGASTRODUODENOSCOPY (EGD);  Surgeon: Rogene Houston, MD;  [facility];  Service: Endoscopy;  Laterality: N/A;  . EUS N/A 10/07/2012   Procedure: UPPER ENDOSCOPIC ULTRASOUND (EUS) LINEAR;  Surgeon: Milus Banister, MD;  [facility];  Service: Endoscopy;  Laterality: N/A;  . INCISION AND DRAINAGE ABSCESS N/A 04/14/2017   Procedure: INCISION AND DRAINAGE ABSCESS;  Surgeon: Ceasar Mons, MD;  [facility];  Service: Urology;  Laterality: N/A;  . INCISION AND DRAINAGE ABSCESS ANAL    . IRRIGATION AND DEBRIDEMENT ABSCESS N/A 04/15/2017   Procedure: DEBRIDEMENT SCROTAL WOUND AND DRESSING CHANGE;  Surgeon: Ceasar Mons, MD;  [facility];  Service: Urology;  Laterality: N/A;  . IRRIGATION AND DEBRIDEMENT ABSCESS N/A 04/17/2017   Procedure: IRRIGATION AND DEBRIDEMENT ABSCESS;  Surgeon: Ceasar Mons, MD;  [facility];  Service: Urology;  Laterality: N/A;  RM 3  . LAPAROSCOPIC GASTRIC BANDING  2006   has been removed  . OSTECTOMY Right 04/08/2017   Procedure: OSTECTOMY RIGHT GREAT TOE;  Surgeon: Caprice Beaver, DPM;  Location: [facility];  Service: Podiatry;  Laterality: Right;  . PORT-A-CATH REMOVAL Left 12/07/2012   Procedure: REMOVAL PORT-A-CATH;  Surgeon: Scherry Ran, MD;  Location: [facility];  Service: General;  Laterality: Left;  . PORT-A-CATH REMOVAL N/A 10/02/2017   Procedure: MINOR REMOVAL PORT-A-CATH AT BEDSIDE;  Surgeon: Donnie Mesa, MD;  Location: [facility];  Service: General;  Laterality: N/A;  . PORTACATH PLACEMENT  07/07/2011   Procedure: INSERTION PORT-A-CATH;  Surgeon: Scherry Ran, MD;  Location[facility];  Service: General;  Laterality: N/A;  . PORTACATH PLACEMENT N/A 12/07/2012   Procedure: INSERTION PORT-A-CATH;  Surgeon: Scherry Ran, MD;  Location: [facility];  Service: General;  Laterality: N/A;  Attempted portacath placement on left and right side  . URETEROSCOPY Right 12/16/2017    Procedure: DIAGNOSTIC RIGHT URETEROSCOPY;  Surgeon: Cleon Gustin, MD;  [facility];  Service: Urology;  Laterality: Right;  . WOUND DEBRIDEMENT Right 04/08/2017   Procedure: EXCISION ULCERATION RIGHT GREAT TOE;  Surgeon: Caprice Beaver, DPM;  Location: [facility];  Service: Podiatry;  Laterality: Right;  . WRIST SURGERY     Left; removal of bone fragment    SOCIAL HISTORY:  Social History   Socioeconomic History  . Marital status: Married    Spouse name: Not on file  . Number of children: Not on file  . Years of education: 17  . Highest education level: Not on file  Occupational History  . Occupation: Nurse, learning disability  . Occupation: Optometrist: [employer]  Tobacco Use  . Smoking status: Former Smoker    Packs/day: 0.25    Years: 1.00    Pack years: 0.25    Types: Cigarettes, Cigars    Quit date: 05/17/2001    Years since quitting: 18.6  . Smokeless tobacco: Never Used  Vaping Use  . Vaping Use: Never used  Substance and Sexual Activity  .  Alcohol use: No    Alcohol/week: 0.0 standard drinks  . Drug use: No  . Sexual activity: Yes    Birth control/protection: None  Other Topics Concern  . Not on file  Social History Narrative  . Not on file   Social Determinants of Health   Financial Resource Strain:   . Difficulty of Paying Living Expenses:   Food Insecurity:   . Worried About Charity fundraiser in the Last Year:   . Arboriculturist in the Last Year:   Transportation Needs:   . Film/video editor (Medical):   Marland Kitchen Lack of Transportation (Non-Medical):   Physical Activity:   . Days of Exercise per Week:   . Minutes of Exercise per Session:   Stress:   . Feeling of Stress :   Social Connections:   . Frequency of Communication with Friends and Family:   . Frequency of Social Gatherings with Friends and Family:   . Attends Religious Services:   . Active Member of Clubs or Organizations:   . Attends Theatre manager Meetings:   Marland Kitchen Marital Status:   Intimate Partner Violence:   . Fear of Current or Ex-Partner:   . Emotionally Abused:   Marland Kitchen Physically Abused:   . Sexually Abused:     FAMILY HISTORY:  Family History  Problem Relation Age of Onset  . Heart disease Mother   . Cancer Mother   . Diabetes Father   . Arthritis Other   . Anesthesia problems Neg Hx   . Hypotension Neg Hx   . Malignant hyperthermia Neg Hx   . Pseudochol deficiency Neg Hx     CURRENT MEDICATIONS:  Current Outpatient Medications  Medication Sig Dispense Refill  . acetaminophen (TYLENOL) 500 MG tablet Take 500-1,000 mg by mouth every 6 (six) hours as needed for mild pain or moderate pain.    Marland Kitchen acyclovir (ZOVIRAX) 400 MG tablet Take 1 tablet (400 mg total) by mouth every morning. 30 tablet 5  . allopurinol (ZYLOPRIM) 300 MG tablet TAKE ONE TABLET BY MOUTH ONCE DAILY. 30 tablet 2  . Artificial Tear Ointment (DRY EYES OP) Place 1 drop into both eyes as needed (dry eyes).    Marland Kitchen atorvastatin (LIPITOR) 80 MG tablet TAKE ONE TABLET BY MOUTH AT BEDTIME. 90 tablet 1  . calcitRIOL (ROCALTROL) 0.25 MCG capsule Take 0.25 mcg by mouth every Monday.     . Cholecalciferol (D2000 ULTRA STRENGTH) 50 MCG (2000 UT) CAPS Take by mouth.    . CONTOUR NEXT TEST test strip     . dicyclomine (BENTYL) 20 MG tablet TAKE 1 TABLET BY MOUTH BEFORE MEALS 3 TIMES DAILY. 90 tablet 2  . Eluxadoline (VIBERZI) 100 MG TABS Take 100 mg 2 (two) times daily with a meal by mouth.     . ferrous sulfate 325 (65 FE) MG EC tablet Take 45 mg by mouth. Every other day    . gabapentin (NEURONTIN) 600 MG tablet TAKE 1 TABLET BY MOUTH ONCE A DAY. (Patient taking differently: Take 600 mg by mouth 2 (two) times daily. Pt takes 1 tablet in a.m. and 1/2 tablet at bedtime) 30 tablet 0  . insulin aspart (NOVOLOG) 100 UNIT/ML injection Inject into the skin. Sliding scale over 150= 1 unit, 200= 2 units,250= 3 units    . insulin detemir (LEVEMIR) 100 UNIT/ML injection  Inject 0.1 mLs (10 Units total) into the skin every morning. 10 mL 11  . INSULIN SYRINGE .5CC/29G 29G X 1/2"  0.5 ML MISC     . loratadine (CLARITIN) 10 MG tablet Take 10 mg by mouth every morning.     . Magnesium Cl-Calcium Carbonate (SLOW-MAG PO) Take 1 tablet by mouth every morning.    . Microlet Lancets MISC     . Multiple Vitamins-Minerals (MULTIVITAMINS THER. W/MINERALS) TABS Take 1 tablet by mouth daily.     . nitroGLYCERIN (NITROSTAT) 0.4 MG SL tablet DISSOLVE 1 TABLET UNDER TONGUE EVERY 5 MINUTES UP TO 15 MIN FOR CHESTPAIN. IF NO RELIEF CALL 911. 25 tablet 0  . pantoprazole (PROTONIX) 40 MG tablet Take 40 mg by mouth daily.    . potassium chloride SA (KLOR-CON) 20 MEQ tablet Take 20 mEq by mouth 2 (two) times daily.    . sodium bicarbonate 650 MG tablet Take 650 mg by mouth 3 (three) times daily.    . SURE COMFORT PEN NEEDLES 29G X 12.7MM MISC USE AS DIRECTED FOURCTIMES DAILY.    Marland Kitchen torsemide (DEMADEX) 10 MG tablet Take 10 mg by mouth 2 (two) times daily.    Alveda Reasons 20 MG TABS tablet TAKE 1 TABLET BY MOUTH DAILY. 30 tablet 0   No current facility-administered medications for this visit.   Facility-Administered Medications Ordered in Other Visits  Medication Dose Route Frequency Provider Last Rate Last Admin  . sodium chloride 0.9 % injection 10 mL  10 mL Intravenous Once Farrel Gobble, MD        ALLERGIES:  Allergies  Allergen Reactions  . Beef-Derived Products Diarrhea  . Pork-Derived Products Diarrhea    PHYSICAL EXAM:  Performance status (ECOG): 1 - Symptomatic but completely ambulatory  Vitals:   12/28/19 1215  BP: (!) 147/60  Pulse: 64  Resp: 20  Temp: (!) 97.3 F (36.3 C)  SpO2: 99%   Wt Readings from Last 3 Encounters:  12/28/19 (!) 385 lb 3.2 oz (174.7 kg)  07/01/19 (!) 376 lb (170.6 kg)  02/08/19 (!) 378 lb (171.5 kg)   Physical Exam Vitals reviewed.  Constitutional:      Appearance: Normal appearance.  HENT:     Mouth/Throat:     Mouth: Mucous  membranes are moist.  Eyes:     Pupils: Pupils are equal, round, and reactive to light.  Cardiovascular:     Rate and Rhythm: Normal rate and regular rhythm.     Pulses: Normal pulses.     Heart sounds: Normal heart sounds.  Pulmonary:     Effort: Pulmonary effort is normal.     Breath sounds: Normal breath sounds.  Abdominal:     Palpations: Abdomen is soft. There is no mass.     Tenderness: There is no abdominal tenderness.  Musculoskeletal:     Right lower leg: No edema.     Left lower leg: No edema.  Neurological:     Mental Status: He is alert and oriented to person, place, and time.  Psychiatric:        Mood and Affect: Mood normal.        Behavior: Behavior normal.     LABORATORY DATA:  I have reviewed the labs as listed.  CBC Latest Ref Rng & Units 12/13/2019 06/10/2019 02/01/2019  WBC 4.0 - 10.5 K/uL 6.9 5.7 5.9  Hemoglobin 13.0 - 17.0 g/dL 11.6(L) 11.8(L) 12.8(L)  Hematocrit 39 - 52 % 37.4(L) 37.5(L) 40.2  Platelets 150 - 400 K/uL 163 188 148(L)   CMP Latest Ref Rng & Units 12/13/2019 06/10/2019 02/01/2019  Glucose 70 - 99 mg/dL 131(H) 157(H)  144(H)  BUN 6 - 20 mg/dL 75(H) 40(H) 55(H)  Creatinine 0.61 - 1.24 mg/dL 2.78(H) 2.26(H) 2.38(H)  Sodium 135 - 145 mmol/L 140 142 139  Potassium 3.5 - 5.1 mmol/L 4.6 4.4 3.8  Chloride 98 - 111 mmol/L 107 113(H) 112(H)  CO2 22 - 32 mmol/L 22 23 19(L)  Calcium 8.9 - 10.3 mg/dL 8.8(L) 8.3(L) 8.5(L)  Total Protein 6.5 - 8.1 g/dL 7.3 6.5 6.7  Total Bilirubin 0.3 - 1.2 mg/dL 0.5 0.6 0.9  Alkaline Phos 38 - 126 U/L 115 101 106  AST 15 - 41 U/L 31 27 41  ALT 0 - 44 U/L 33 27 39      Component Value Date/Time   RBC 3.75 (L) 12/13/2019 1434   MCV 99.7 12/13/2019 1434   MCH 30.9 12/13/2019 1434   MCHC 31.0 12/13/2019 1434   RDW 14.6 12/13/2019 1434   LYMPHSABS 2.3 12/13/2019 1434   MONOABS 0.6 12/13/2019 1434   EOSABS 0.2 12/13/2019 1434   BASOSABS 0.0 12/13/2019 1434    DIAGNOSTIC IMAGING:  I have independently reviewed the  scans and discussed with the patient. No results found.    ASSESSMENT:  1.  Multiple myeloma: -Immunofixation has been negative since 2017.  2.  Macrocytic anemia: -He has mild anemia from CKD and relative iron deficiency. -Last Feraheme was on 12/14/2017. -He is taking iron tablet every other day.   PLAN:  1.  Multiple myeloma: -Does not report any new onset bone pains. -Reviewed labs from 12/13/2019, M spike was undetectable.  Immunofixation was normal.  Free light chain ratio is 1.65 with elevated kappa light chains of 85.2 and lambda light chains of 51.6 consistent with CKD. -Hemoglobin is 11.6 and stable.  Creatinine has increased to 2.78.  Patient reports that he was started on diuretic and his blood pressure medication was changed. -I plan to see him back in 6 months with repeat labs.  2.  Normocytic anemia: -Hemoglobin is 11.6 and stable.  MCV is 99 7. -Anemia from CKD and relative iron deficiency. -Ferritin was 158 and percent saturation 25.  B12 was normal. -No intervention necessary.   Orders placed this encounter:  No orders of the defined types were placed in this encounter.    Derek Jack, MD [facility] [phone]   I, Jacqualyn Posey, am acting as a scribe for Dr. Sanda Linger.  I, Derek Jack MD, have reviewed the above documentation for accuracy and completeness, and I agree with the above.

## 2019-12-28 NOTE — Patient Instructions (Signed)
[facility] at [facility] Discharge Instructions  You were seen today by Dr. Katragadda. He went over your recent results. Dr. Katragadda will see you back in for labs and follow up.   Thank you for choosing [facility] at [facility] to provide your oncology and hematology care.  To afford each patient quality time with our provider, please arrive at least 15 minutes before your scheduled appointment time.   If you have a lab appointment with the Cancer Center please come in thru the Main Entrance and check in at the main information desk  You need to re-schedule your appointment should you arrive 10 or more minutes late.  We strive to give you quality time with our providers, and arriving late affects you and other patients whose appointments are after yours.  Also, if you no show three or more times for appointments you may be dismissed from the clinic at the providers discretion.     Again, thank you for choosing [facility].  Our hope is that these requests will decrease the amount of time that you wait before being seen by our physicians.       _____________________________________________________________  Should you have questions after your visit to [facility], please contact our office at [phone] between the hours of 8:00 a.m. and 4:30 p.m.  Voicemails left after 4:00 p.m. will not be returned until the following business day.  For prescription refill requests, have your pharmacy contact our office and allow 72 hours.    Cancer Center Support Programs:   > Cancer Support Group  2nd Tuesday of the month 1pm-2pm, Journey Room    

## 2020-01-16 ENCOUNTER — Telehealth (HOSPITAL_COMMUNITY): Payer: Self-pay | Admitting: *Deleted

## 2020-01-16 NOTE — Telephone Encounter (Signed)
Pt called stating that he would like to know if it is ok if he gets a COVID booster shot. I spoke with [name] and he advised that he is only aware of the booster shot being available to pt's in active Tx, within 2 years of steam cell transplant, solid organ transplant or on chronic steroid treatment. I called the pt and advised him of above and that once it is available for him to get then he is more than welcome to get it. The pt verbalized understanding.

## 2020-01-26 IMAGING — DX DG CHEST 1V PORT
1 series · 1 of 1 positions shown · non-contrast
Comparison: 09/30/2017

CLINICAL DATA: Endotracheal tube placement

EXAM:
PORTABLE CHEST 1 VIEW

[chest]
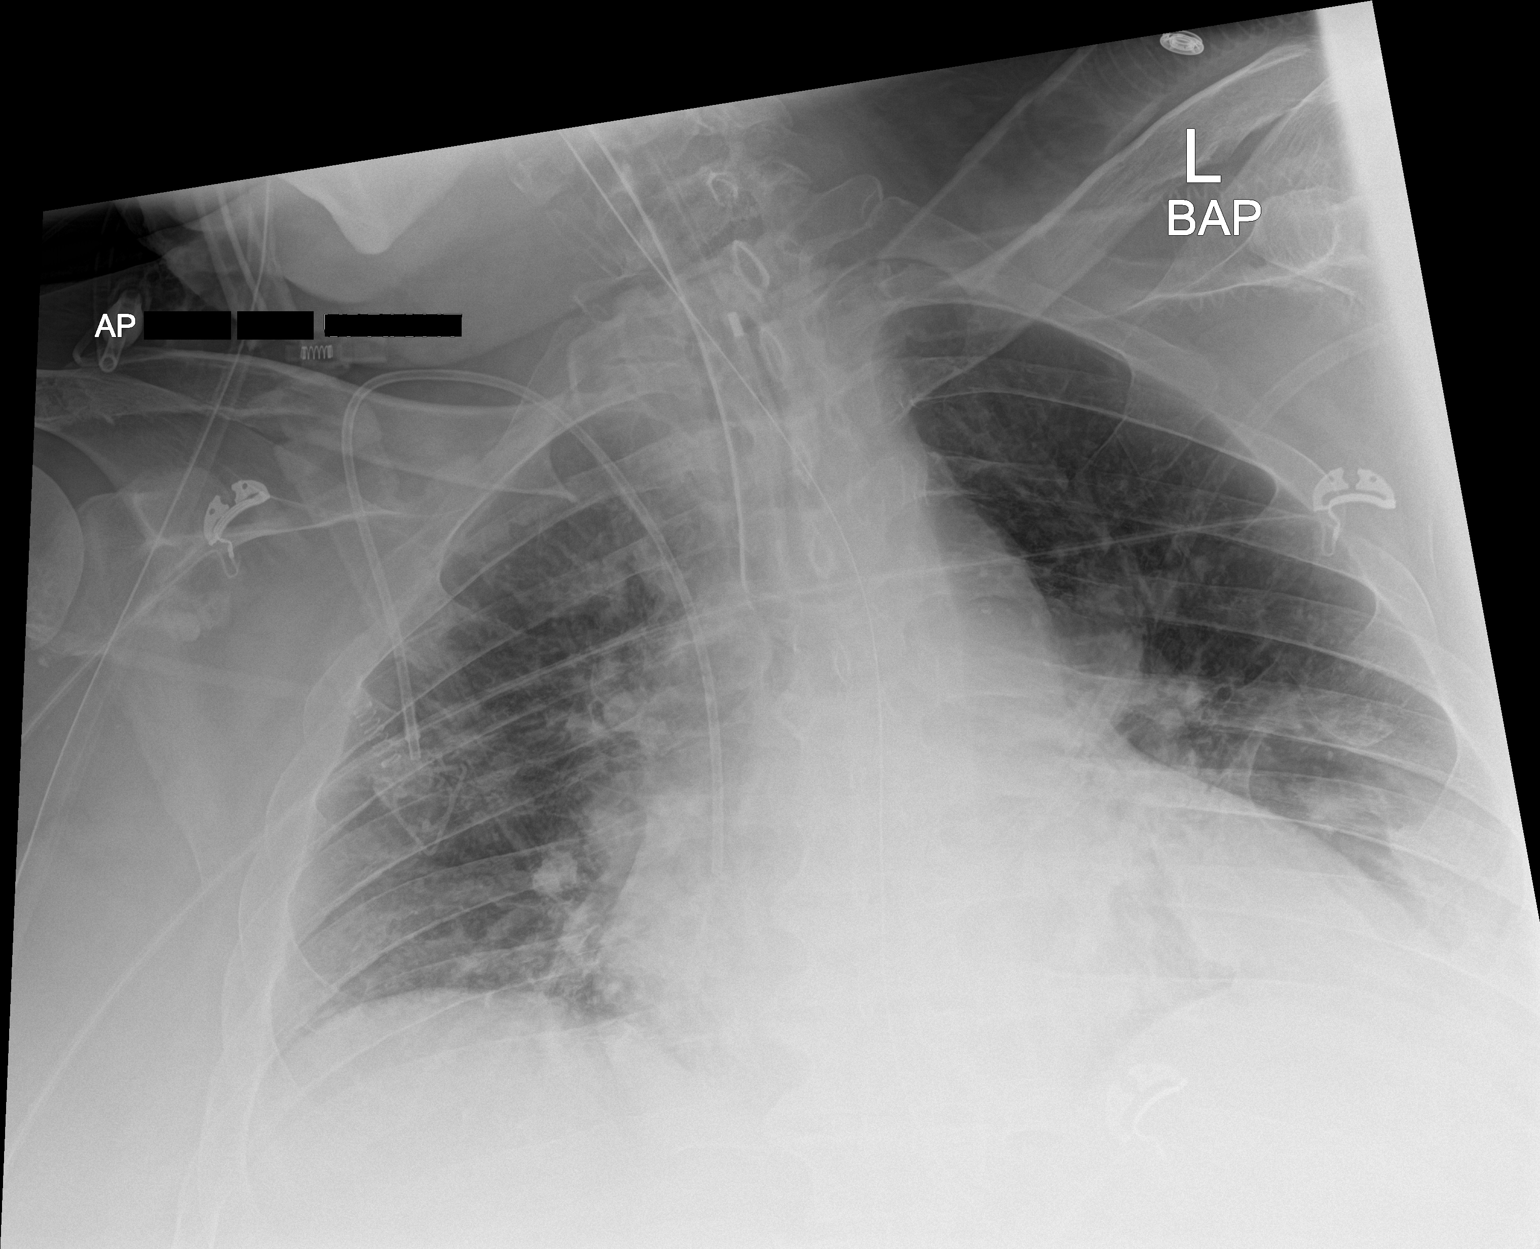

[1 of 1 positions shown; findings below may reference images not displayed]

FINDINGS: 7358 hours. Endotracheal tube tip is 2.3 cm above the base of the
carina. The NG tube passes into the stomach although the distal tip
position is not included on the film. The cardio pericardial
silhouette is enlarged. There is pulmonary vascular congestion
without overt pulmonary edema. Left base collapse/consolidation
noted. Right Port-A-Cath tip overlies the distal SVC. Bones are
diffusely demineralized. Telemetry leads overlie the chest.
IMPRESSION: 1. Endotracheal tube tip is 2.3 cm above the base of the carina.
2. Low lung volumes with cardiomegaly and vascular congestion.
3. Left base collapse/consolidation.

## 2020-01-27 IMAGING — DX DG CHEST 1V PORT
2 series · 2 of 2 positions shown · non-contrast
Comparison: Radiograph October 01, 2017.

CLINICAL DATA: Acute encephalopathy.

EXAM:
PORTABLE CHEST 1 VIEW

[chest ap (1 of 2)]
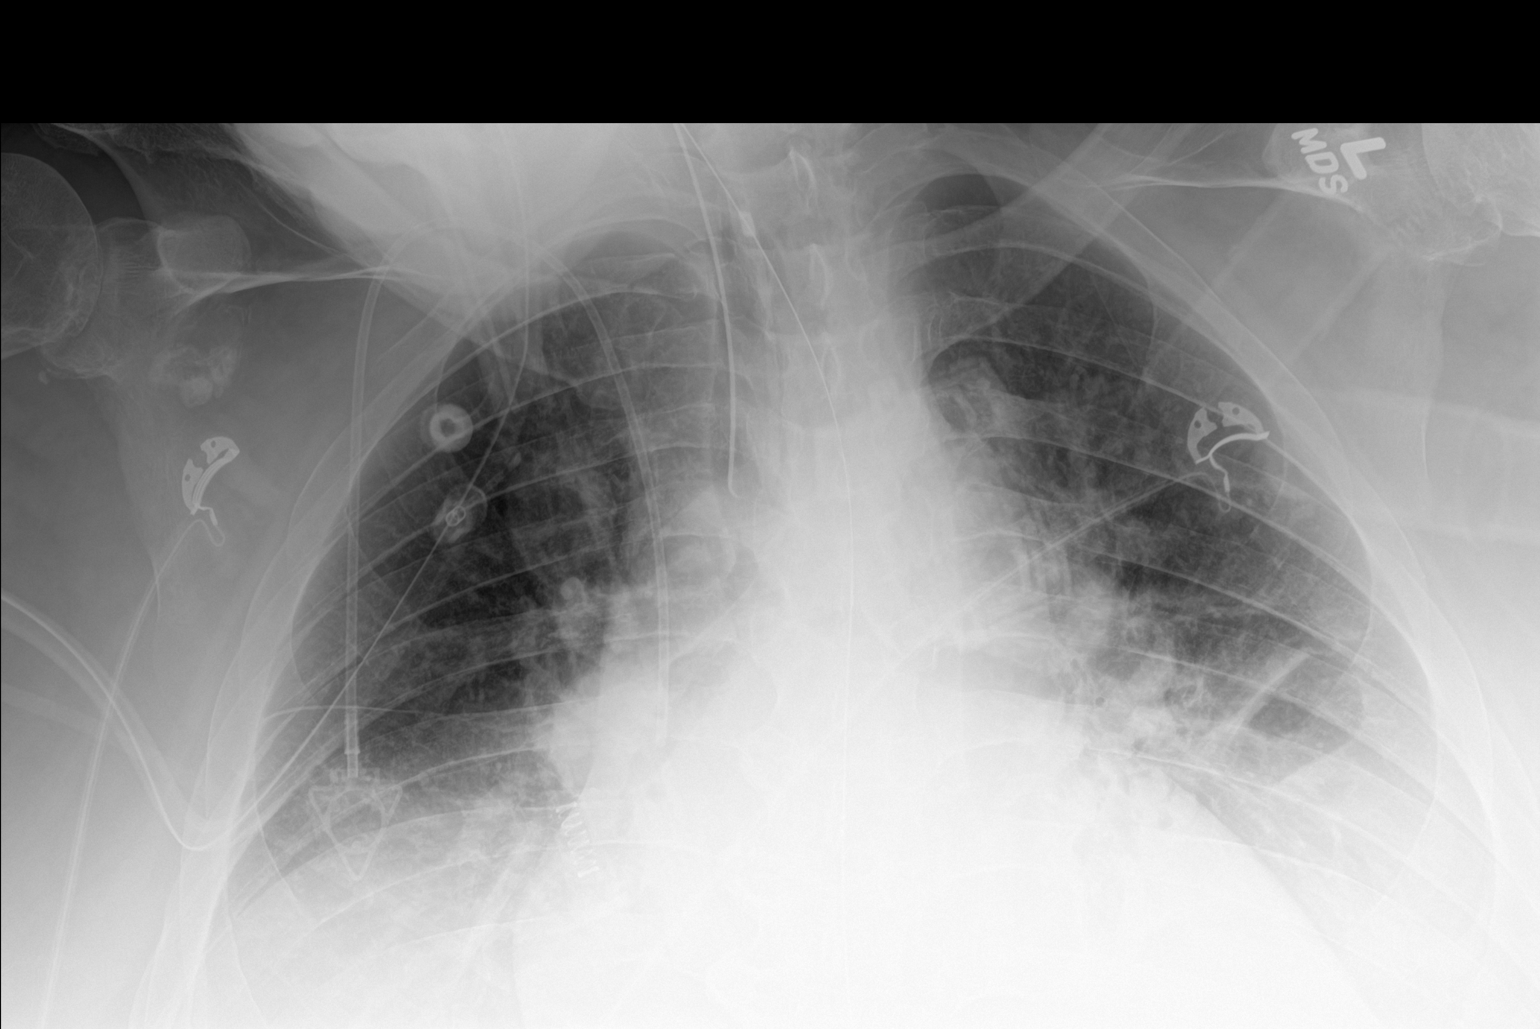

[chest ap (2 of 2)]
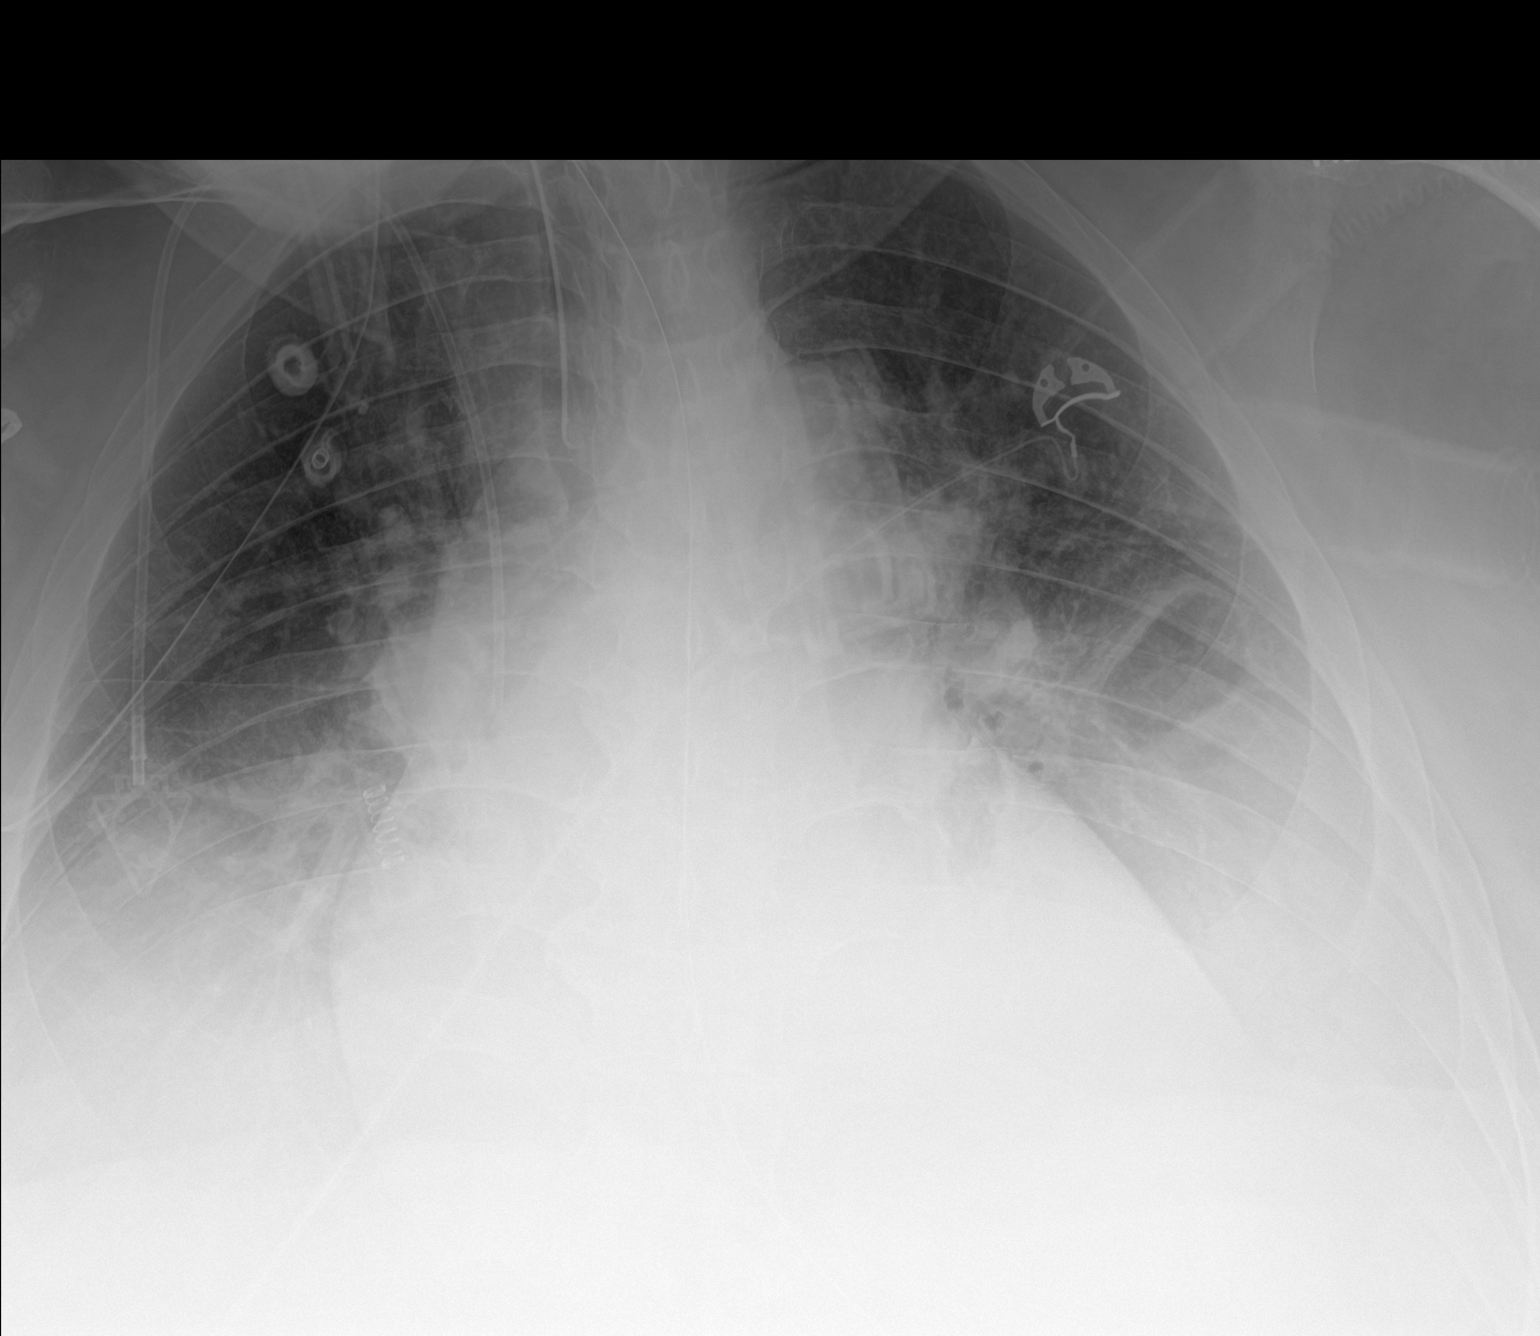

[2 of 2 positions shown; findings below may reference images not displayed]

FINDINGS: Stable cardiomegaly is noted. Endotracheal and nasogastric tubes are
unchanged in position. Right internal jugular Port-A-Cath is
unchanged in position. No pneumothorax is noted. Increased bibasilar
subsegmental atelectasis is noted with associated pleural effusions.
Bony thorax is unremarkable.
IMPRESSION: Stable support apparatus. Increased bibasilar subsegmental
atelectasis is noted with pleural effusions.

## 2020-01-29 IMAGING — DX DG CHEST 1V PORT
1 series · 2 of 2 positions shown · non-contrast
Comparison: Chest x-ray 10/03/2017.

CLINICAL DATA: 48-year-old male with history of acute respiratory
failure with hypoxia.

EXAM:
PORTABLE CHEST 1 VIEW

[Series 1: chest · 0.14mm/px · 2 of 2 slices shown]
[im 1/2]
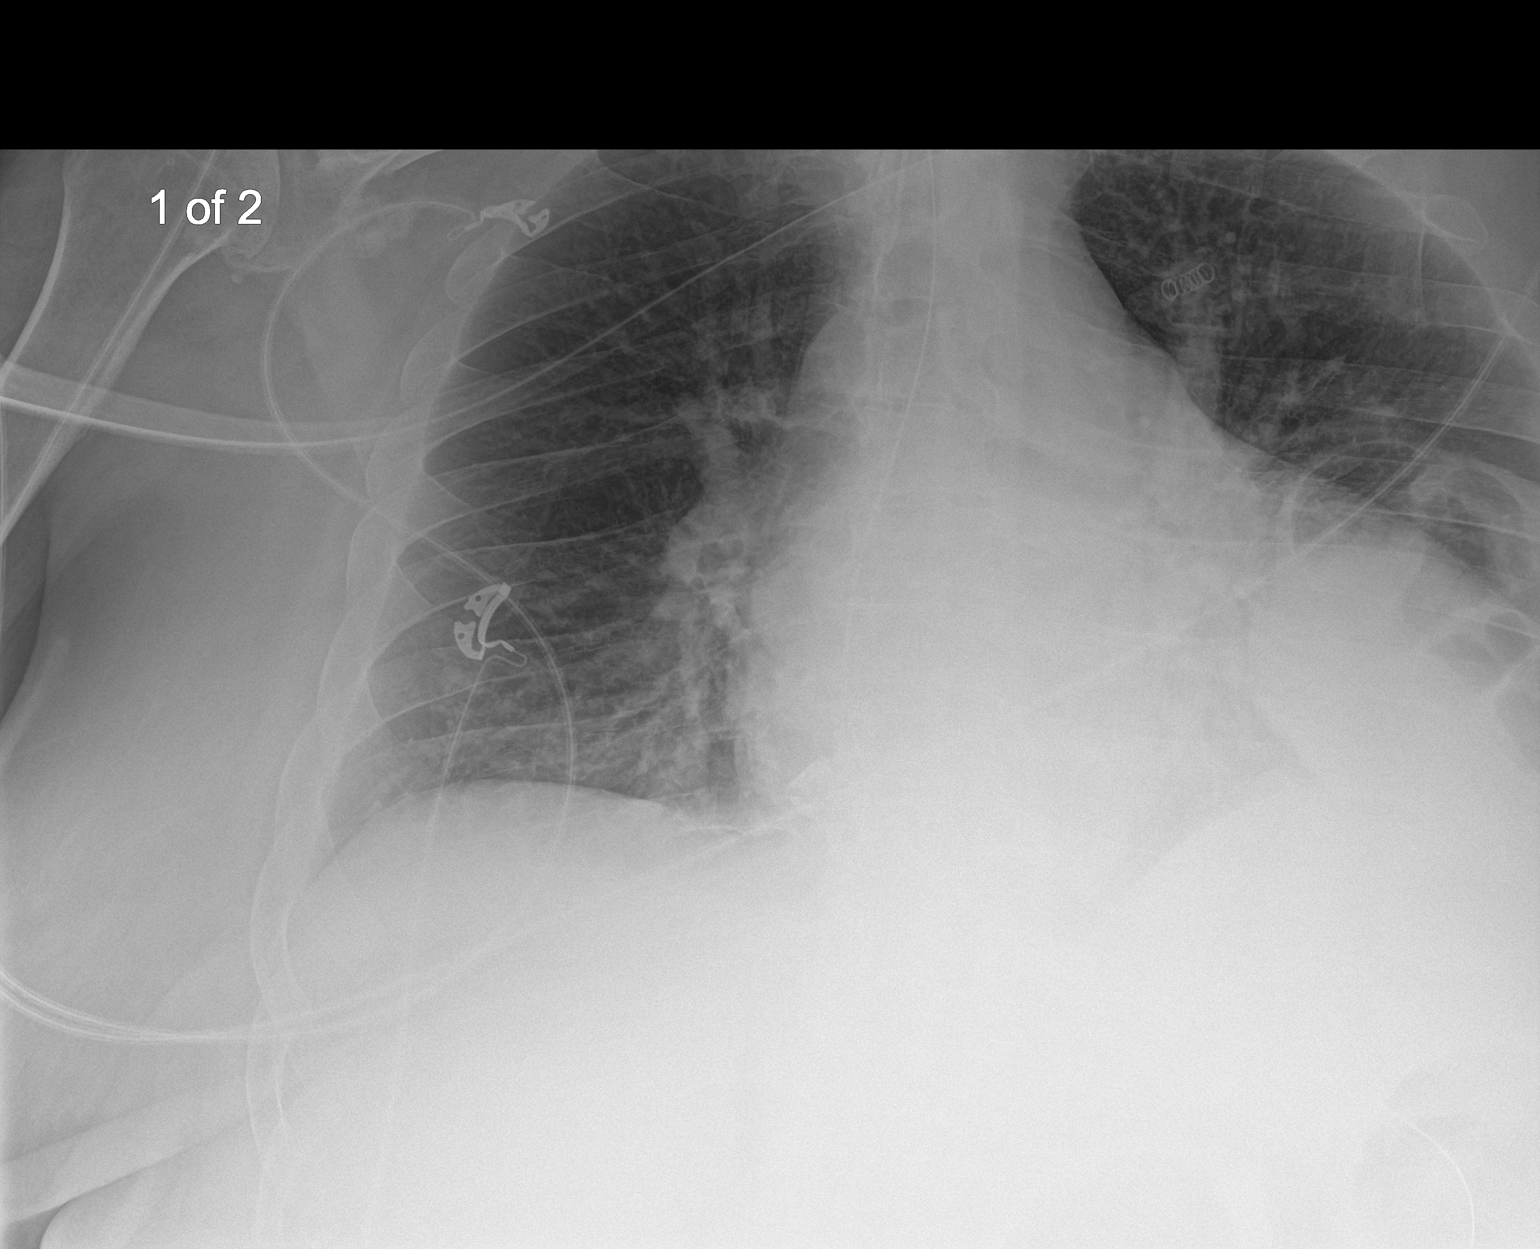
[im 2/2]
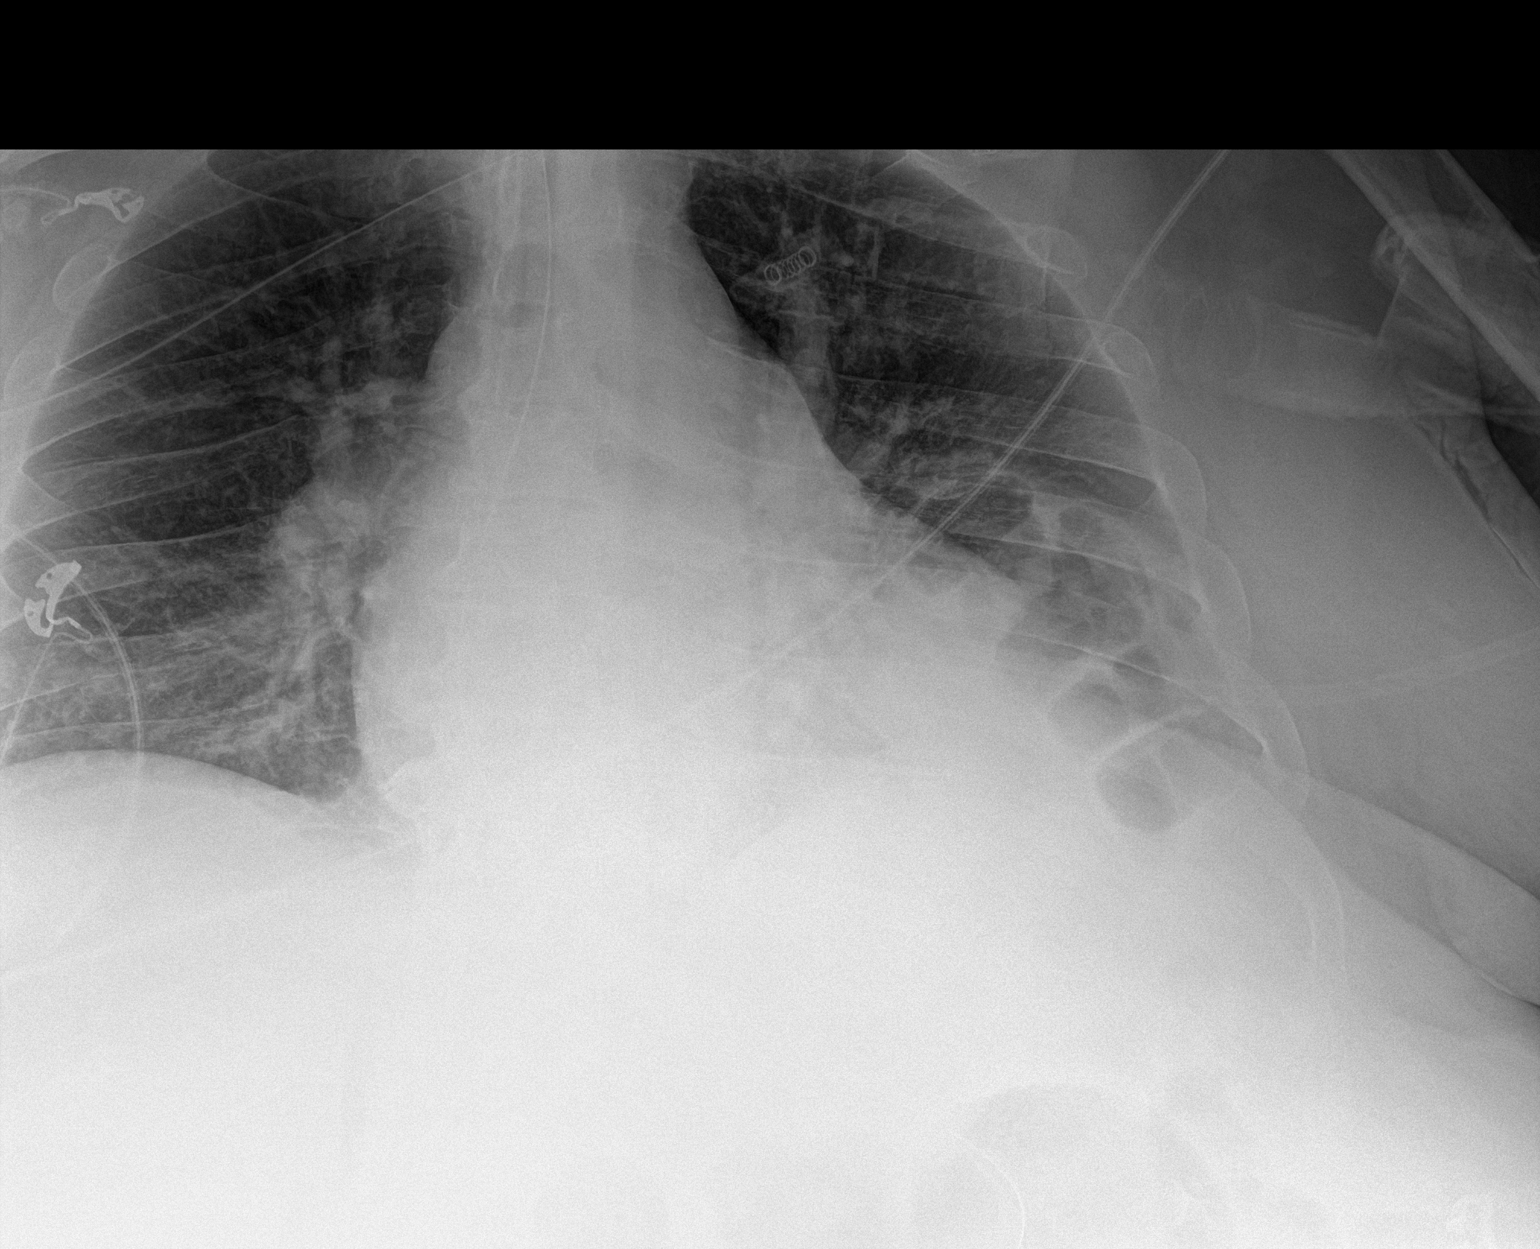

[2 of 2 positions shown; findings below may reference images not displayed]

FINDINGS: An endotracheal tube is in place with tip 5.1 cm above the carina. A
nasogastric tube is seen extending into the stomach, however, the
tip of the nasogastric tube extends below the lower margin of the
image. Severe eventration of the lateral left hemidiaphragm, similar
to prior studies. Low lung volumes. Bibasilar opacities (left
greater than right) may reflect areas of atelectasis and/or airspace
consolidation. No evidence of pulmonary edema. No pneumothorax.
Heart size appears mildly enlarged. Upper mediastinal contours are
within normal limits.
IMPRESSION: 1. Support apparatus, as above.
2. Otherwise, the radiographic appearance the chest is very similar
to prior studies, as above.

## 2020-01-30 IMAGING — DX DG CHEST 1V PORT
1 series · 1 of 1 positions shown · non-contrast
Comparison: 10/04/2017

CLINICAL DATA: Acute respiratory failure

EXAM:
PORTABLE CHEST 1 VIEW

[chest ap]
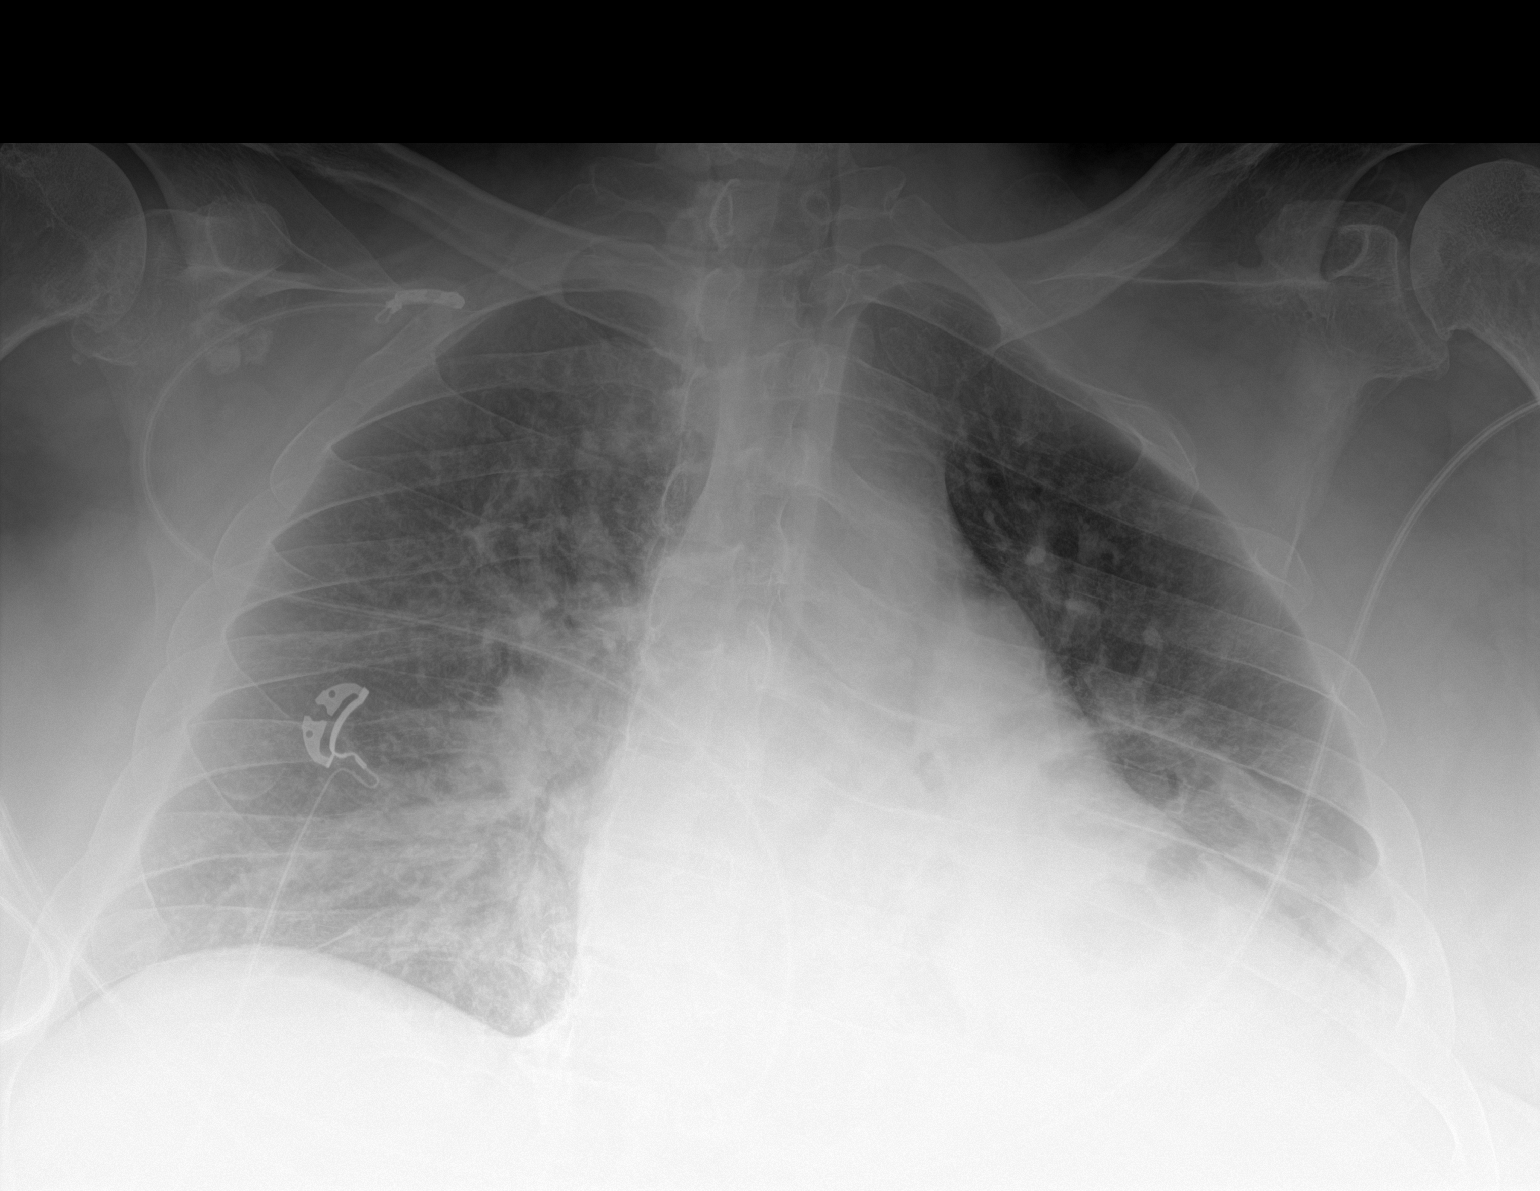

[1 of 1 positions shown; findings below may reference images not displayed]

FINDINGS: Cardiac shadow is stable. Elevation of left hemidiaphragm is again
noted. Increasing vascular congestion and right basilar infiltrate
is seen. Endotracheal tube and nasogastric catheter have been
removed in the interval.
IMPRESSION: Increasing right basilar infiltrate.

Changes consistent with congestive failure.

## 2020-03-26 DIAGNOSIS — Z7901 Long term (current) use of anticoagulants: Secondary | ICD-10-CM | POA: Diagnosis not present

## 2020-03-26 DIAGNOSIS — N183 Chronic kidney disease, stage 3 unspecified: Secondary | ICD-10-CM | POA: Diagnosis not present

## 2020-03-26 DIAGNOSIS — I480 Paroxysmal atrial fibrillation: Secondary | ICD-10-CM | POA: Diagnosis not present

## 2020-03-26 DIAGNOSIS — E1122 Type 2 diabetes mellitus with diabetic chronic kidney disease: Secondary | ICD-10-CM | POA: Diagnosis not present

## 2020-06-05 DIAGNOSIS — E1142 Type 2 diabetes mellitus with diabetic polyneuropathy: Secondary | ICD-10-CM | POA: Diagnosis not present

## 2020-06-05 DIAGNOSIS — L84 Corns and callosities: Secondary | ICD-10-CM | POA: Diagnosis not present

## 2020-06-05 DIAGNOSIS — M792 Neuralgia and neuritis, unspecified: Secondary | ICD-10-CM | POA: Diagnosis not present

## 2020-06-08 ENCOUNTER — Other Ambulatory Visit: Payer: Self-pay

## 2020-06-08 ENCOUNTER — Encounter: Payer: Self-pay | Admitting: Cardiology

## 2020-06-08 ENCOUNTER — Ambulatory Visit (INDEPENDENT_AMBULATORY_CARE_PROVIDER_SITE_OTHER): Payer: BC Managed Care – PPO | Admitting: Cardiology

## 2020-06-08 VITALS — BP 148/90 | HR 72 | Ht 74.0 in | Wt 399.4 lb

## 2020-06-08 DIAGNOSIS — I4892 Unspecified atrial flutter: Secondary | ICD-10-CM | POA: Diagnosis not present

## 2020-06-08 DIAGNOSIS — I5022 Chronic systolic (congestive) heart failure: Secondary | ICD-10-CM | POA: Diagnosis not present

## 2020-06-08 MED ORDER — HYDRALAZINE HCL 25 MG PO TABS
25.0000 mg | ORAL_TABLET | Freq: Three times a day (TID) | ORAL | 3 refills | Status: DC
Start: 2020-06-08 — End: 2020-07-11

## 2020-06-08 MED ORDER — ISOSORBIDE MONONITRATE ER 30 MG PO TB24: 15.0000 mg | ORAL_TABLET | Freq: Every day | ORAL | 3 refills | Status: DC

## 2020-06-08 NOTE — Patient Instructions (Addendum)
Medication Instructions:  Your physician has recommended you make the following change in your medication:   Start Hydralazine 25 mg Three Times Daily  Start Imdur 15 mg Daily   *If you need a refill on your cardiac medications before your next appointment, please call your pharmacy*   Lab Work: NONE   If you have labs (blood work) drawn today and your tests are completely normal, you will receive your results only by: Marland Kitchen MyChart Message (if you have MyChart) OR . A paper copy in the mail If you have any lab test that is abnormal or we need to change your treatment, we will call you to review the results.   Testing/Procedures: NONE    Follow-Up: At [facility], you and your health needs are our priority.  As part of our continuing mission to provide you with exceptional heart care, we have created designated Provider Care Teams.  These Care Teams include your primary Cardiologist (physician) and Advanced Practice Providers (APPs -  Physician Assistants and Nurse Practitioners) who all work together to provide you with the care you need, when you need it.  We recommend signing up for the patient portal called "MyChart".  Sign up information is provided on this After Visit Summary.  MyChart is used to connect with patients for Virtual Visits (Telemedicine).  Patients are able to view lab/test results, encounter notes, upcoming appointments, etc.  Non-urgent messages can be sent to your provider as well.   To learn more about what you can do with MyChart, go to [email or web address].    Your next appointment:   6 week(s)  The format for your next appointment:   In Person  Provider:   Carlyle Dolly, MD   Other Instructions Thank you for choosing [facility]!

## 2020-06-08 NOTE — Progress Notes (Signed)
Clinical Summary Mr. Bistline is a 60 y.o.male last seen by Dr Bronson Ing, this is our first visit together.   1. CAD - prior stents as reported below - no recent chest pain - occasoinal SOB, some fatigue. Has gained 14 lbs since 12/2019  2. Chronic systolic HF - 0/1027 ehco LVEF 25-30%, grade II diastolic dysfxn.  - No ACE inhibitor's or angiotensin receptor blockers due to advanced chronic kidney disease - off beta blocker due to bradycardia from prior Dr Raliegh Ip notes.   - has had some recent LE edema. - weight is up 14 lbs since 12/2019 -reports nephrologist had lowered diuretic, had issues with fluid loss with diarrhea.   3. Aflutter CrCl is 76 based on calculations.   4. CKD III/IV - Cr up to 3.78, trending 2.6 by last labs.   5. OSA   6. Multiple myeloma   7. HTN - home bp's 120s-130s/80s    Past Medical History:  Diagnosis Date  . Abscess 04/2017   Scrotal  . Anemia   . Arteriosclerotic cardiovascular disease (ASCVD)    MI-2000s; stent to the proximal LAD and diagonal in 2001; stress nuclear in 2008-impaired exercise capacity, left ventricular dilatation, moderately to severely depressed EF, apical, inferior and anteroseptal scar  . Arthritis   . Atrial flutter ([place])   . 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 Cellulitis of leg    both legs  . Chronic diarrhea   . Chronic kidney disease, stage 3, mod decreased GFR    Creatinine of 1.84 in 06/2011 and 1.5 in 07/2011  . Diabetes mellitus    Insulin  . Dysrhythmia    AFlutter  . GERD (gastroesophageal reflux disease)   . Gout   . Hyperlipidemia   . Hypertension   . Injection site reaction   . Multiple myeloma 07/01/2011  . Myocardial infarction ([place]) 2000  . Obesity   . Pedal edema    Venous insufficiency  . Sleep apnea    uses cpap  . Ulcer      Allergies  Allergen Reactions  . Beef-Derived Products Diarrhea  . Pork-Derived Products Diarrhea     Current Outpatient Medications  Medication  Sig Dispense Refill  . acetaminophen (TYLENOL) 500 MG tablet Take 500-1,000 mg by mouth every 6 (six) hours as needed for mild pain or moderate pain.    Marland Kitchen acyclovir (ZOVIRAX) 400 MG tablet Take 1 tablet (400 mg total) by mouth every morning. 30 tablet 5  . allopurinol (ZYLOPRIM) 300 MG tablet TAKE ONE TABLET BY MOUTH ONCE DAILY. 30 tablet 2  . Artificial Tear Ointment (DRY EYES OP) Place 1 drop into both eyes as needed (dry eyes).    Marland Kitchen atorvastatin (LIPITOR) 80 MG tablet TAKE ONE TABLET BY MOUTH AT BEDTIME. 90 tablet 1  . Cholecalciferol (D2000 ULTRA STRENGTH) 50 MCG (2000 UT) CAPS Take by mouth.    . CONTOUR NEXT TEST test strip     . dicyclomine (BENTYL) 20 MG tablet TAKE 1 TABLET BY MOUTH BEFORE MEALS 3 TIMES DAILY. 90 tablet 2  . Eluxadoline (VIBERZI) 100 MG TABS Take 100 mg 2 (two) times daily with a meal by mouth.     . ferrous sulfate 325 (65 FE) MG EC tablet Take 45 mg by mouth. Every other day    . gabapentin (NEURONTIN) 600 MG tablet TAKE 1 TABLET BY MOUTH ONCE A DAY. (Patient taking differently: Take 600 mg by mouth 2 (two) times daily. Pt takes 1 tablet in a.m. and  1/2 tablet at bedtime) 30 tablet 0  . insulin aspart (NOVOLOG) 100 UNIT/ML injection Inject into the skin. Sliding scale over 150= 1 unit, 200= 2 units,250= 3 units    . insulin detemir (LEVEMIR) 100 UNIT/ML injection Inject 0.1 mLs (10 Units total) into the skin every morning. 10 mL 11  . INSULIN SYRINGE .5CC/29G 29G X 1/2" 0.5 ML MISC     . lisinopril (ZESTRIL) 5 MG tablet Take 5 mg by mouth daily.    Marland Kitchen loratadine (CLARITIN) 10 MG tablet Take 10 mg by mouth every morning.     . Magnesium Cl-Calcium Carbonate (SLOW-MAG PO) Take 1 tablet by mouth every morning.    . Microlet Lancets MISC     . Multiple Vitamins-Minerals (MULTIVITAMINS THER. W/MINERALS) TABS Take 1 tablet by mouth daily.     . nitroGLYCERIN (NITROSTAT) 0.4 MG SL tablet DISSOLVE 1 TABLET UNDER TONGUE EVERY 5 MINUTES UP TO 15 MIN FOR CHESTPAIN. IF NO RELIEF  CALL 911. 25 tablet 0  . pantoprazole (PROTONIX) 40 MG tablet Take 40 mg by mouth daily.    . potassium chloride SA (KLOR-CON) 20 MEQ tablet Take 20 mEq by mouth 2 (two) times daily.    . sodium bicarbonate 650 MG tablet Take 650 mg by mouth 3 (three) times daily.    . SURE COMFORT PEN NEEDLES 29G X 12.7MM MISC USE AS DIRECTED FOURCTIMES DAILY.    Marland Kitchen torsemide (DEMADEX) 10 MG tablet Take 10 mg by mouth 2 (two) times daily.    Alveda Reasons 20 MG TABS tablet TAKE 1 TABLET BY MOUTH DAILY. 30 tablet 0   No current facility-administered medications for this visit.   Facility-Administered Medications Ordered in Other Visits  Medication Dose Route Frequency Provider Last Rate Last Admin  . sodium chloride 0.9 % injection 10 mL  10 mL Intravenous Once Farrel Gobble, MD         Past Surgical History:  Procedure Laterality Date  . ABSCESS DRAINAGE     Scrotal  . BIOPSY  01/02/2012   Procedure: BIOPSY;  Surgeon: Rogene Houston, MD;  [facility];  Service: Endoscopy;  Laterality: N/A;  . BONE MARROW BIOPSY  05/13/11  . CARDIAC CATHETERIZATION     cardiac stent  . CARDIOVERSION N/A 10/13/2012   Procedure: CARDIOVERSION;  Surgeon: Yehuda Savannah, MD;  Location: [facility];  Service: Cardiovascular;  Laterality: N/A;  . CATARACT EXTRACTION W/PHACO Left 02/13/2014   Procedure: CATARACT EXTRACTION PHACO AND INTRAOCULAR LENS PLACEMENT ([place]);  Surgeon: Tonny , MD;  Location: [facility];  Service: Ophthalmology;  Laterality: Left;  CDE:  7.67  . CATARACT EXTRACTION W/PHACO Right 03/02/2014   Procedure: CATARACT EXTRACTION PHACO AND INTRAOCULAR LENS PLACEMENT RIGHT EYE CDE=16.81;  Surgeon: Tonny , MD;  Location: [facility];  Service: Ophthalmology;  Laterality: Right;  . COLONOSCOPY  11/28/2011   Procedure: COLONOSCOPY;  Surgeon: Rogene Houston, MD;  [facility];  Service: Endoscopy;  Laterality: N/A;  930  . CYSTOSCOPY W/ RETROGRADES Right 12/16/2017   Procedure: CYSTOSCOPY WITH RIGHT  RETROGRADE PYELOGRAM AND RIGHT URETERAL STENT REMOVAL;  Surgeon: Cleon Gustin, MD;  Location: [facility];  Service: Urology;  Laterality: Right;  . CYSTOSCOPY W/ URETERAL STENT PLACEMENT Right 07/31/2017   Procedure: CYSTOSCOPY WITH RETROGRADE PYELOGRAM/URETERAL STENT PLACEMENT;  Surgeon: Cleon Gustin, MD;  Location: [facility];  Service: Urology;  Laterality: Right;  . ESOPHAGOGASTRODUODENOSCOPY  01/02/2012   Procedure: ESOPHAGOGASTRODUODENOSCOPY (EGD);  Surgeon: Rogene Houston, MD;  [facility];  Service: Endoscopy;  Laterality: N/A;  100  . ESOPHAGOGASTRODUODENOSCOPY N/A 09/20/2012   Procedure: ESOPHAGOGASTRODUODENOSCOPY (EGD);  Surgeon: Rogene Houston, MD;  [facility];  Service: Endoscopy;  Laterality: N/A;  . EUS N/A 10/07/2012   Procedure: UPPER ENDOSCOPIC ULTRASOUND (EUS) LINEAR;  Surgeon: Milus Banister, MD;  [facility];  Service: Endoscopy;  Laterality: N/A;  . INCISION AND DRAINAGE ABSCESS N/A 04/14/2017   Procedure: INCISION AND DRAINAGE ABSCESS;  Surgeon: Ceasar Mons, MD;  [facility];  Service: Urology;  Laterality: N/A;  . INCISION AND DRAINAGE ABSCESS ANAL    . IRRIGATION AND DEBRIDEMENT ABSCESS N/A 04/15/2017   Procedure: DEBRIDEMENT SCROTAL WOUND AND DRESSING CHANGE;  Surgeon: Ceasar Mons, MD;  [facility];  Service: Urology;  Laterality: N/A;  . IRRIGATION AND DEBRIDEMENT ABSCESS N/A 04/17/2017   Procedure: IRRIGATION AND DEBRIDEMENT ABSCESS;  Surgeon: Ceasar Mons, MD;  [facility];  Service: Urology;  Laterality: N/A;  RM 3  . LAPAROSCOPIC GASTRIC BANDING  2006   has been removed  . OSTECTOMY Right 04/08/2017   Procedure: OSTECTOMY RIGHT GREAT TOE;  Surgeon: Caprice Beaver, DPM;  Location: [facility];  Service: Podiatry;  Laterality: Right;  . PORT-A-CATH REMOVAL Left 12/07/2012   Procedure: REMOVAL PORT-A-CATH;  Surgeon: Scherry Ran, MD;  Location: [facility];  Service: General;   Laterality: Left;  . PORT-A-CATH REMOVAL N/A 10/02/2017   Procedure: MINOR REMOVAL PORT-A-CATH AT BEDSIDE;  Surgeon: Donnie Mesa, MD;  Location: [facility];  Service: General;  Laterality: N/A;  . PORTACATH PLACEMENT  07/07/2011   Procedure: INSERTION PORT-A-CATH;  Surgeon: Scherry Ran, MD;  [facility];  Service: General;  Laterality: N/A;  . PORTACATH PLACEMENT N/A 12/07/2012   Procedure: INSERTION PORT-A-CATH;  Surgeon: Scherry Ran, MD;  Location: [facility];  Service: General;  Laterality: N/A;  Attempted portacath placement on left and right side  . URETEROSCOPY Right 12/16/2017   Procedure: DIAGNOSTIC RIGHT URETEROSCOPY;  Surgeon: Cleon Gustin, MD;  [facility];  Service: Urology;  Laterality: Right;  . WOUND DEBRIDEMENT Right 04/08/2017   Procedure: EXCISION ULCERATION RIGHT GREAT TOE;  Surgeon: Caprice Beaver, DPM;  Location[facility];  Service: Podiatry;  Laterality: Right;  . WRIST SURGERY     Left; removal of bone fragment     Allergies  Allergen Reactions  . Beef-Derived Products Diarrhea  . Pork-Derived Products Diarrhea      Family History  Problem Relation Age of Onset  . Heart disease Mother   . Cancer Mother   . Diabetes Father   . Arthritis Other   . Anesthesia problems Neg Hx   . Hypotension Neg Hx   . Malignant hyperthermia Neg Hx   . Pseudochol deficiency Neg Hx      Social History Mr. Schnyder reports that he quit smoking about 19 years ago. His smoking use included cigarettes and cigars. He has a 0.25 pack-year smoking history. He has never used smokeless tobacco. Mr. Duque reports no history of alcohol use.   Review of Systems CONSTITUTIONAL: No weight loss, fever, chills, weakness or fatigue.  HEENT: Eyes: No visual loss, blurred vision, double vision or yellow sclerae.No hearing loss, sneezing, congestion, runny nose or sore throat.  SKIN: No rash or itching.  CARDIOVASCULAR: per hpi RESPIRATORY: No shortness of breath,  cough or sputum.  GASTROINTESTINAL: No anorexia, nausea, vomiting or diarrhea. No abdominal pain or blood.  GENITOURINARY: No burning on  urination, no polyuria NEUROLOGICAL: No headache, dizziness, syncope, paralysis, ataxia, numbness or tingling in the extremities. No change in bowel or bladder control.  MUSCULOSKELETAL: No muscle, back pain, joint pain or stiffness.  LYMPHATICS: No enlarged nodes. No history of splenectomy.  PSYCHIATRIC: No history of depression or anxiety.  ENDOCRINOLOGIC: No reports of sweating, cold or heat intolerance. No polyuria or polydipsia.  Marland Kitchen   Physical Examination Today's Vitals   06/08/20 0827  BP: (!) 148/90  Pulse: 72  SpO2: 99%  Weight: (!) 399 lb 6.4 oz (181.2 kg)  Height: '6\' 2"'  (1.88 m)   Body mass index is 51.28 kg/m.  Gen: resting comfortably, no acute distress HEENT: no scleral icterus, pupils equal round and reactive, no palptable cervical adenopathy,  CV: RRR, no mr/g, no jvd Resp: Clear to auscultation bilaterally GI: abdomen is soft, non-tender, non-distended, normal bowel sounds, no hepatosplenomegaly MSK: extremities are warm, no edema.  Skin: warm, no rash Neuro:  no focal deficits Psych: appropriate affect      Assessment and Plan  1. Chronic systolic HF - medical therapy limited. Bradycardia on beta blockers now off. Significant renal dysfunction, on just low dose ACEI per renal  - start hydral 14m tid, imdur 145mdaiy - discussed possible ICD. Will try to optimize meds best we can and recheck echo in next few monts  2. Aflutter  - no symptoms - not on av nodal agents due to prior bradycardia - EKG today shows SR - he is on appropriate dose xarelto, CrCl is 76    F/u 6 weeks, titrate hydral/imdur   JoArnoldo LenisM.D.

## 2020-06-11 ENCOUNTER — Other Ambulatory Visit (INDEPENDENT_AMBULATORY_CARE_PROVIDER_SITE_OTHER): Payer: BC Managed Care – PPO

## 2020-06-11 DIAGNOSIS — I4892 Unspecified atrial flutter: Secondary | ICD-10-CM

## 2020-06-18 ENCOUNTER — Institutional Professional Consult (permissible substitution): Payer: BC Managed Care – PPO | Admitting: Pulmonary Disease

## 2020-06-19 ENCOUNTER — Ambulatory Visit: Payer: BC Managed Care – PPO | Admitting: Cardiology

## 2020-06-19 DIAGNOSIS — E1142 Type 2 diabetes mellitus with diabetic polyneuropathy: Secondary | ICD-10-CM | POA: Diagnosis not present

## 2020-06-26 DIAGNOSIS — N170 Acute kidney failure with tubular necrosis: Secondary | ICD-10-CM | POA: Diagnosis not present

## 2020-06-26 DIAGNOSIS — N189 Chronic kidney disease, unspecified: Secondary | ICD-10-CM | POA: Diagnosis not present

## 2020-06-26 DIAGNOSIS — E872 Acidosis: Secondary | ICD-10-CM | POA: Diagnosis not present

## 2020-06-26 DIAGNOSIS — E1122 Type 2 diabetes mellitus with diabetic chronic kidney disease: Secondary | ICD-10-CM | POA: Diagnosis not present

## 2020-06-28 DIAGNOSIS — E872 Acidosis: Secondary | ICD-10-CM | POA: Diagnosis not present

## 2020-06-28 DIAGNOSIS — D638 Anemia in other chronic diseases classified elsewhere: Secondary | ICD-10-CM | POA: Diagnosis not present

## 2020-06-28 DIAGNOSIS — N189 Chronic kidney disease, unspecified: Secondary | ICD-10-CM | POA: Diagnosis not present

## 2020-06-28 DIAGNOSIS — I5022 Chronic systolic (congestive) heart failure: Secondary | ICD-10-CM | POA: Diagnosis not present

## 2020-07-03 ENCOUNTER — Inpatient Hospital Stay (HOSPITAL_COMMUNITY): Payer: BC Managed Care – PPO | Attending: Hematology

## 2020-07-05 DIAGNOSIS — N189 Chronic kidney disease, unspecified: Secondary | ICD-10-CM | POA: Diagnosis not present

## 2020-07-05 DIAGNOSIS — E872 Acidosis: Secondary | ICD-10-CM | POA: Diagnosis not present

## 2020-07-05 DIAGNOSIS — D638 Anemia in other chronic diseases classified elsewhere: Secondary | ICD-10-CM | POA: Diagnosis not present

## 2020-07-05 DIAGNOSIS — E1122 Type 2 diabetes mellitus with diabetic chronic kidney disease: Secondary | ICD-10-CM | POA: Diagnosis not present

## 2020-07-10 ENCOUNTER — Ambulatory Visit (HOSPITAL_COMMUNITY): Payer: BC Managed Care – PPO | Admitting: Hematology

## 2020-07-11 ENCOUNTER — Ambulatory Visit: Payer: BC Managed Care – PPO | Admitting: Cardiology

## 2020-07-11 ENCOUNTER — Other Ambulatory Visit: Payer: Self-pay

## 2020-07-11 ENCOUNTER — Encounter: Payer: Self-pay | Admitting: Cardiology

## 2020-07-11 VITALS — BP 148/80 | HR 72 | Ht 73.0 in | Wt >= 6400 oz

## 2020-07-11 DIAGNOSIS — L97511 Non-pressure chronic ulcer of other part of right foot limited to breakdown of skin: Secondary | ICD-10-CM | POA: Diagnosis not present

## 2020-07-11 DIAGNOSIS — L97521 Non-pressure chronic ulcer of other part of left foot limited to breakdown of skin: Secondary | ICD-10-CM | POA: Diagnosis not present

## 2020-07-11 DIAGNOSIS — I2510 Atherosclerotic heart disease of native coronary artery without angina pectoris: Secondary | ICD-10-CM

## 2020-07-11 DIAGNOSIS — I5022 Chronic systolic (congestive) heart failure: Secondary | ICD-10-CM | POA: Diagnosis not present

## 2020-07-11 DIAGNOSIS — E1142 Type 2 diabetes mellitus with diabetic polyneuropathy: Secondary | ICD-10-CM | POA: Diagnosis not present

## 2020-07-11 DIAGNOSIS — I4892 Unspecified atrial flutter: Secondary | ICD-10-CM | POA: Diagnosis not present

## 2020-07-11 MED ORDER — HYDRALAZINE HCL 50 MG PO TABS
50.0000 mg | ORAL_TABLET | Freq: Three times a day (TID) | ORAL | 3 refills | Status: DC
Start: 2020-07-11 — End: 2020-10-02

## 2020-07-11 MED ORDER — TORSEMIDE 20 MG PO TABS
40.0000 mg | ORAL_TABLET | Freq: Every day | ORAL | 3 refills | Status: DC
Start: 2020-07-11 — End: 2020-11-15

## 2020-07-11 NOTE — Patient Instructions (Signed)
Medication Instructions:  Your physician has recommended you make the following change in your medication:   Take Torsemide 40 mg Daily  Increase Hydralazine to 50 mg Three times Daily   WEIGHT YOURSELF DAILY AND CALL WITH WEIGHTS ON Friday.   *If you need a refill on your cardiac medications before your next appointment, please call your pharmacy*   Lab Work: NONE   If you have labs (blood work) drawn today and your tests are completely normal, you will receive your results only by: Marland Kitchen MyChart Message (if you have MyChart) OR . A paper copy in the mail If you have any lab test that is abnormal or we need to change your treatment, we will call you to review the results.   Testing/Procedures: NONE   Follow-Up: At [facility], you and your health needs are our priority.  As part of our continuing mission to provide you with exceptional heart care, we have created designated Provider Care Teams.  These Care Teams include your primary Cardiologist (physician) and Advanced Practice Providers (APPs -  Physician Assistants and Nurse Practitioners) who all work together to provide you with the care you need, when you need it.  We recommend signing up for the patient portal called "MyChart".  Sign up information is provided on this After Visit Summary.  MyChart is used to connect with patients for Virtual Visits (Telemedicine).  Patients are able to view lab/test results, encounter notes, upcoming appointments, etc.  Non-urgent messages can be sent to your provider as well.   To learn more about what you can do with MyChart, go to [email or web address].    Your next appointment:   6 week(s)  The format for your next appointment:   In Person  Provider:   Carlyle Dolly, MD   Other Instructions Thank you for choosing [facility]!

## 2020-07-11 NOTE — Progress Notes (Signed)
Clinical Summary Mr. Liou is a 60 y.o.male seen today for follow up of the following medical problems.   1. CAD - prior stents as reported below -denies chest pain  2. Chronic systolic HF - 11/7891 ehco LVEF 25-30%, grade II diastolic dysfxn.  - No ACE inhibitor's or angiotensin receptor blockers due to advanced chronic kidney disease - off beta blocker due to bradycardia from prior Dr Raliegh Ip notes.    - weight 385 12/2019, up to 399 at 06/2020 appt, today 404 lbs - last visit we started hydral/nitrates - at Jan 27 appt with neprhology had increased swelling, torsemide increased to 34m alternating with 213m Sensitive to diuretics given intermittent diarrhea - since change swelling is stable. Home weights 399 lbs last by home  - mild swelling in legs, can feel abdominal distension and some SOB. Chronically sleeps at angle - chronic diarrhea that can exacebate at times.    3. Aflutter CrCl is 76 based on calculations.   - no recent palpictations - no bleeding in stools  4. CKD III/IV - followed by Dr BhTheador Hawthorne5. OSA   6. Multiple myeloma   7. HTN - compliant with meds    Works at [employer] 3rd generation [employer]  Past Medical History:  Diagnosis Date  . Abscess 04/2017   Scrotal  . Anemia   . Arteriosclerotic cardiovascular disease (ASCVD)    MI-2000s; stent to the proximal LAD and diagonal in 2001; stress nuclear in 2008-impaired exercise capacity, left ventricular dilatation, moderately to severely depressed EF, apical, inferior and anteroseptal scar  . Arthritis   . [place]  . 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 Cellulitis of leg    both legs  . Chronic diarrhea   . Chronic kidney disease, stage 3, mod decreased GFR (HCC)    Creatinine of 1.84 in 06/2011 and 1.5 in 07/2011  . Diabetes mellitus    Insulin  . Dysrhythmia    AFlutter  . GERD (gastroesophageal reflux disease)   . Gout   .  Hyperlipidemia   . Hypertension   . Injection site reaction   . Multiple myeloma 07/01/2011  . [place]  . Obesity   . Pedal edema    Venous insufficiency  . Sleep apnea    uses cpap  . Ulcer      Allergies  Allergen Reactions  . Beef-Derived Products Diarrhea  . Pork-Derived Products Diarrhea     Current Outpatient Medications  Medication Sig Dispense Refill  . acetaminophen (TYLENOL) 500 MG tablet Take 500-1,000 mg by mouth every 6 (six) hours as needed for mild pain or moderate pain.    . Marland Kitchencyclovir (ZOVIRAX) 400 MG tablet Take 1 tablet (400 mg total) by mouth every morning. 30 tablet 5  . allopurinol (ZYLOPRIM) 300 MG tablet TAKE ONE TABLET BY MOUTH ONCE DAILY. 30 tablet 2  . Artificial Tear Ointment (DRY EYES OP) Place 1 drop into both eyes as needed (dry eyes).    . Marland Kitchentorvastatin (LIPITOR) 80 MG tablet TAKE ONE TABLET BY MOUTH AT BEDTIME. 90 tablet 1  . Cholecalciferol (D2000 ULTRA STRENGTH) 50 MCG (2000 UT) CAPS Take by mouth.    . CONTOUR NEXT TEST test strip     . dicyclomine (BENTYL) 20 MG tablet TAKE 1 TABLET BY MOUTH BEFORE MEALS 3 TIMES DAILY. 90 tablet 2  . Eluxadoline 100 MG TABS Take 100 mg 2 (two) times daily with a meal by mouth.     .Marland Kitchen  ferrous sulfate 325 (65 FE) MG EC tablet Take 45 mg by mouth. Every other day    . gabapentin (NEURONTIN) 600 MG tablet TAKE 1 TABLET BY MOUTH ONCE A DAY. (Patient taking differently: Take 600 mg by mouth 2 (two) times daily. Pt takes 1 tablet in a.m. and 1/2 tablet at bedtime) 30 tablet 0  . hydrALAZINE (APRESOLINE) 25 MG tablet Take 1 tablet (25 mg total) by mouth 3 (three) times daily. 270 tablet 3  . insulin aspart (NOVOLOG) 100 UNIT/ML injection Inject into the skin. Sliding scale over 150= 1 unit, 200= 2 units,250= 3 units    . insulin detemir (LEVEMIR) 100 UNIT/ML injection Inject 0.1 mLs (10 Units total) into the skin every morning. 10 mL 11  . INSULIN SYRINGE .5CC/29G 29G X 1/2" 0.5 ML MISC     .  isosorbide mononitrate (IMDUR) 30 MG 24 hr tablet Take 0.5 tablets (15 mg total) by mouth daily. 45 tablet 3  . lisinopril (ZESTRIL) 5 MG tablet Take 5 mg by mouth daily.    Marland Kitchen loratadine (CLARITIN) 10 MG tablet Take 10 mg by mouth every morning.    . Magnesium Cl-Calcium Carbonate (SLOW-MAG PO) Take 1 tablet by mouth every morning.    . Microlet Lancets MISC     . Multiple Vitamins-Minerals (MULTIVITAMINS THER. W/MINERALS) TABS Take 1 tablet by mouth daily.    . nitroGLYCERIN (NITROSTAT) 0.4 MG SL tablet DISSOLVE 1 TABLET UNDER TONGUE EVERY 5 MINUTES UP TO 15 MIN FOR CHESTPAIN. IF NO RELIEF CALL 911. 25 tablet 0  . pantoprazole (PROTONIX) 40 MG tablet Take 40 mg by mouth daily.    . potassium chloride SA (KLOR-CON) 20 MEQ tablet Take 20 mEq by mouth 2 (two) times daily.    . sodium bicarbonate 650 MG tablet Take 650 mg by mouth 3 (three) times daily.    . SURE COMFORT PEN NEEDLES 29G X 12.7MM MISC USE AS DIRECTED FOURCTIMES DAILY.    Marland Kitchen torsemide (DEMADEX) 10 MG tablet Take 10 mg by mouth 2 (two) times daily.    Alveda Reasons 20 MG TABS tablet TAKE 1 TABLET BY MOUTH DAILY. 30 tablet 0   No current facility-administered medications for this visit.   Facility-Administered Medications Ordered in Other Visits  Medication Dose Route Frequency Provider Last Rate Last Admin  . sodium chloride 0.9 % injection 10 mL  10 mL Intravenous Once Farrel Gobble, MD         Past Surgical History:  Procedure Laterality Date  . ABSCESS DRAINAGE     Scrotal  . BIOPSY  01/02/2012   Procedure: BIOPSY;  Surgeon: Rogene Houston, MD;  [facility];  Service: Endoscopy;  Laterality: N/A;  . BONE MARROW BIOPSY  05/13/11  . CARDIAC CATHETERIZATION     cardiac stent  . CARDIOVERSION N/A 10/13/2012   Procedure: CARDIOVERSION;  Surgeon: Yehuda Savannah, MD;  [facility];  Service: Cardiovascular;  Laterality: N/A;  . CATARACT EXTRACTION W/PHACO Left 02/13/2014   Procedure: CATARACT EXTRACTION PHACO  AND INTRAOCULAR LENS PLACEMENT ([place]);  Surgeon: Tonny , MD;  Location: [facility];  Service: Ophthalmology;  Laterality: Left;  CDE:  7.67  . CATARACT EXTRACTION W/PHACO Right 03/02/2014   Procedure: CATARACT EXTRACTION PHACO AND INTRAOCULAR LENS PLACEMENT RIGHT EYE CDE=16.81;  Surgeon: Tonny , MD;  [facility];  Service: Ophthalmology;  Laterality: Right;  . COLONOSCOPY  11/28/2011   Procedure: COLONOSCOPY;  Surgeon: Rogene Houston, MD;  [facility];  Service: Endoscopy;  Laterality: N/A;  930  . CYSTOSCOPY W/ RETROGRADES Right 12/16/2017   Procedure: CYSTOSCOPY WITH RIGHT RETROGRADE PYELOGRAM AND RIGHT URETERAL STENT REMOVAL;  Surgeon: Cleon Gustin, MD;  [facility];  Service: Urology;  Laterality: Right;  . CYSTOSCOPY W/ URETERAL STENT PLACEMENT Right 07/31/2017   Procedure: CYSTOSCOPY WITH RETROGRADE PYELOGRAM/URETERAL STENT PLACEMENT;  Surgeon: Cleon Gustin, MD;  [facility];  Service: Urology;  Laterality: Right;  . ESOPHAGOGASTRODUODENOSCOPY  01/02/2012   Procedure: ESOPHAGOGASTRODUODENOSCOPY (EGD);  Surgeon: Rogene Houston, MD;  [facility];  Service: Endoscopy;  Laterality: N/A;  100  . ESOPHAGOGASTRODUODENOSCOPY N/A 09/20/2012   Procedure: ESOPHAGOGASTRODUODENOSCOPY (EGD);  Surgeon: Rogene Houston, MD;  [facility];  Service: Endoscopy;  Laterality: N/A;  . EUS N/A 10/07/2012   Procedure: UPPER ENDOSCOPIC ULTRASOUND (EUS) LINEAR;  Surgeon: Milus Banister, MD;  [facility];  Service: Endoscopy;  Laterality: N/A;  . INCISION AND DRAINAGE ABSCESS N/A 04/14/2017   Procedure: INCISION AND DRAINAGE ABSCESS;  Surgeon: Ceasar Mons, MD;  [facility];  Service: Urology;  Laterality: N/A;  . INCISION AND DRAINAGE ABSCESS ANAL    . IRRIGATION AND DEBRIDEMENT ABSCESS N/A 04/15/2017   Procedure: DEBRIDEMENT SCROTAL WOUND AND DRESSING CHANGE;  Surgeon: Ceasar Mons, MD;  [facility];  Service:  Urology;  Laterality: N/A;  . IRRIGATION AND DEBRIDEMENT ABSCESS N/A 04/17/2017   Procedure: IRRIGATION AND DEBRIDEMENT ABSCESS;  Surgeon: Ceasar Mons, MD;  [facility];  Service: Urology;  Laterality: N/A;  RM 3  . LAPAROSCOPIC GASTRIC BANDING  2006   has been removed  . OSTECTOMY Right 04/08/2017   Procedure: OSTECTOMY RIGHT GREAT TOE;  Surgeon: Caprice Beaver, DPM;  [facility];  Service: Podiatry;  Laterality: Right;  . PORT-A-CATH REMOVAL Left 12/07/2012   Procedure: REMOVAL PORT-A-CATH;  Surgeon: Scherry Ran, MD;  [facility];  Service: General;  Laterality: Left;  . PORT-A-CATH REMOVAL N/A 10/02/2017   Procedure: MINOR REMOVAL PORT-A-CATH AT BEDSIDE;  Surgeon: Donnie Mesa, MD;  Location: [place];  Service: General;  Laterality: N/A;  . PORTACATH PLACEMENT  07/07/2011   Procedure: INSERTION PORT-A-CATH;  Surgeon: Scherry Ran, MD;  [facility];  Service: General;  Laterality: N/A;  . PORTACATH PLACEMENT N/A 12/07/2012   Procedure: INSERTION PORT-A-CATH;  Surgeon: Scherry Ran, MD;  [facility];  Service: General;  Laterality: N/A;  Attempted portacath placement on left and right side  . URETEROSCOPY Right 12/16/2017   Procedure: DIAGNOSTIC RIGHT URETEROSCOPY;  Surgeon: Cleon Gustin, MD;  [facility];  Service: Urology;  Laterality: Right;  . WOUND DEBRIDEMENT Right 04/08/2017   Procedure: EXCISION ULCERATION RIGHT GREAT TOE;  Surgeon: Caprice Beaver, DPM;  [facility];  Service: Podiatry;  Laterality: Right;  . WRIST SURGERY     Left; removal of bone fragment     Allergies  Allergen Reactions  . Beef-Derived Products Diarrhea  . Pork-Derived Products Diarrhea      Family History  Problem Relation Age of Onset  . Heart disease Mother   . Cancer Mother   . Diabetes Father   . Arthritis Other   . Anesthesia problems Neg Hx   . Hypotension Neg Hx   . Malignant hyperthermia Neg Hx   . Pseudochol  deficiency Neg Hx      Social History Mr. Jou reports that he quit smoking about 19 years ago. His smoking use included cigarettes and  cigars. He has a 0.25 pack-year smoking history. He has never used smokeless tobacco. Mr. Stewart reports no history of alcohol use.   Review of Systems CONSTITUTIONAL: No weight loss, fever, chills, weakness or fatigue.  HEENT: Eyes: No visual loss, blurred vision, double vision or yellow sclerae.No hearing loss, sneezing, congestion, runny nose or sore throat.  SKIN: No rash or itching.  CARDIOVASCULAR: per hpi RESPIRATORY: some SOB GASTROINTESTINAL: No anorexia, nausea, vomiting or diarrhea. No abdominal pain or blood.  GENITOURINARY: No burning on urination, no polyuria NEUROLOGICAL: No headache, dizziness, syncope, paralysis, ataxia, numbness or tingling in the extremities. No change in bowel or bladder control.  MUSCULOSKELETAL: No muscle, back pain, joint pain or stiffness.  LYMPHATICS: No enlarged nodes. No history of splenectomy.  PSYCHIATRIC: No history of depression or anxiety.  ENDOCRINOLOGIC: No reports of sweating, cold or heat intolerance. No polyuria or polydipsia.  Marland Kitchen   Physical Examination Today's Vitals   07/11/20 0917  BP: (!) 148/80  Pulse: 72  SpO2: 99%  Weight: (!) 404 lb 6.4 oz (183.4 kg)  Height: '6\' 1"'  (1.854 m)   Body mass index is 53.35 kg/m.  Gen: resting comfortably, no acute distress HEENT: no scleral icterus, pupils equal round and reactive, no palptable cervical adenopathy,  CV: RRR, no m/r/g, no jvd Resp: Clear to auscultation bilaterally GI: abdomen is soft, non-tender, non-distended, normal bowel sounds, no hepatosplenomegaly MSK: extremities are warm, no edema.  Skin: warm, no rash Neuro:  no focal deficits Psych: appropriate affect    Assessment and Plan   1. Chronic systolic HF - medical therapy limited. Bradycardia on beta blockers now off. Significant renal dysfunction, on just low dose  ACEI per renal  -uptrending weights, some increased SOB/DOE - diuretics have been difficult, intermittent chronic diarrhea with fluid losses that has led to AKI in the past on diuretics. Currently on torsemide 82m alt days with 230m increase to 4056maily for now and update us [place] weights on Friday, likely titrate back down once weights are back down - increase hydral to 21m52md - will need repeat echo in next few months once medical therapy optimized  2. Aflutter  - no symptoms - not on av nodal agents due to prior bradycardia - no symptoms, continue current meds  3. CAD - no symptoms, continue current meds     JonaArnoldo LenisD

## 2020-07-13 ENCOUNTER — Telehealth: Payer: Self-pay | Admitting: Cardiology

## 2020-07-13 NOTE — Telephone Encounter (Signed)
Patient called to report his weight for 2/11 is 396.6. He will call in again Monday with the last 5 days.

## 2020-07-16 NOTE — Telephone Encounter (Signed)
Contacted patient and got the last 5 days he has weighed.    2/10- 395.4 lb 2/11- 393.6 lb 2/12- Did not weigh 2/13- 396.3 lb (weighed late evening) 2/14- 392.5 lb

## 2020-07-17 DIAGNOSIS — I10 Essential (primary) hypertension: Secondary | ICD-10-CM | POA: Diagnosis not present

## 2020-07-17 DIAGNOSIS — N183 Chronic kidney disease, stage 3 unspecified: Secondary | ICD-10-CM | POA: Diagnosis not present

## 2020-07-17 DIAGNOSIS — I5022 Chronic systolic (congestive) heart failure: Secondary | ICD-10-CM | POA: Diagnosis not present

## 2020-07-17 DIAGNOSIS — E1122 Type 2 diabetes mellitus with diabetic chronic kidney disease: Secondary | ICD-10-CM | POA: Diagnosis not present

## 2020-07-17 NOTE — Telephone Encounter (Signed)
Patient states that he is not experiencing any leg swelling and his SOB is getting better. Patient states that everything is slowly moving in the right direction. Patient had no complaints or questions at this time.

## 2020-07-17 NOTE — Telephone Encounter (Signed)
Weights are moving in the right direction. How are his other symptoms including his leg swelling, abdominal distension, SOB.    Zandra Abts MD

## 2020-07-18 ENCOUNTER — Other Ambulatory Visit: Payer: Self-pay | Admitting: Cardiology

## 2020-07-18 ENCOUNTER — Other Ambulatory Visit: Payer: Self-pay

## 2020-07-18 DIAGNOSIS — Z79899 Other long term (current) drug therapy: Secondary | ICD-10-CM

## 2020-07-18 NOTE — Progress Notes (Signed)
Orders placed for BMET and Mag. Patient notified and will be having labs drawn at [facility].

## 2020-07-18 NOTE — Telephone Encounter (Signed)
Can he get a bmet and Mg in 1 week   Zandra Abts MD

## 2020-07-18 NOTE — Telephone Encounter (Signed)
Patient agreed to have labs drawn next week. Orders were placed. Patient will have labs drawn at [facility].

## 2020-07-19 ENCOUNTER — Ambulatory Visit: Payer: BC Managed Care – PPO | Admitting: Pulmonary Disease

## 2020-07-19 ENCOUNTER — Other Ambulatory Visit (HOSPITAL_COMMUNITY): Payer: Self-pay

## 2020-07-19 ENCOUNTER — Encounter: Payer: Self-pay | Admitting: Pulmonary Disease

## 2020-07-19 ENCOUNTER — Other Ambulatory Visit: Payer: Self-pay

## 2020-07-19 VITALS — BP 148/88 | HR 61 | Temp 97.2°F | Ht 73.0 in | Wt 399.6 lb

## 2020-07-19 DIAGNOSIS — G4730 Sleep apnea, unspecified: Secondary | ICD-10-CM

## 2020-07-19 DIAGNOSIS — Z9989 Dependence on other enabling machines and devices: Secondary | ICD-10-CM | POA: Diagnosis not present

## 2020-07-19 DIAGNOSIS — I5022 Chronic systolic (congestive) heart failure: Secondary | ICD-10-CM

## 2020-07-19 DIAGNOSIS — G4733 Obstructive sleep apnea (adult) (pediatric): Secondary | ICD-10-CM

## 2020-07-19 DIAGNOSIS — D649 Anemia, unspecified: Secondary | ICD-10-CM

## 2020-07-19 DIAGNOSIS — C9000 Multiple myeloma not having achieved remission: Secondary | ICD-10-CM

## 2020-07-19 NOTE — Progress Notes (Signed)
Subjective:    Patient ID: [name], male    DOB: [birth year], 60 y.o.   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  PMH -ischemic cardiomyopathy, EF 25 to 30%,  CAD status post stents Atrial flutter on Xarelto Insulin requiring diabetes CKD stage IIIb, baseline creatinine 2.2-2.8 Myeloma in remission   OSA was diagnosed in the 90s, he reports home sleep test.  He initially did not use the machine but after hospitalization felt dramatically improved after using PAP.  He obtained a CPAP machine from a family member and has been using this with a nasal mask for at least 10 years.  Baseline sleep study is not available for his CPAP settings.  He has not been established with a DME.  CPAP is certainly helped improve his daytime somnolence and fatigue. Epworth sleepiness score is 14. Bedtime is between 11 PM and midnight, sleep latency is about 10 minutes, sleeps on his back with 2 pillows, reports multiple nocturnal awakenings including nocturia and is out of bed by 6 AM feeling refreshed with dryness of mouth but denies headaches. There is no history suggestive of cataplexy, sleep paralysis or parasomnias  He reports increasing dyspnea, I reviewed cardiology report, his weight was up and torsemide was increased to 40 mg daily, hydralazine and nitrates were also initiated. He is a remote smoker, smoked less than 5 pack years  Significant tests/ events reviewed  CT chest 07/2017 LT diaphragmatic hernia  Past Medical History:  Diagnosis Date  . Abscess 04/2017   Scrotal  . Anemia   . Arteriosclerotic cardiovascular disease (ASCVD)    MI-2000s; stent to the proximal LAD and diagonal in 2001; stress nuclear in 2008-impaired exercise capacity, left ventricular dilatation, moderately to severely depressed EF, apical, inferior and anteroseptal scar  . Arthritis   . Atrial flutter  ([place])   . 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 Cellulitis of leg    both legs  . Chronic diarrhea   . Chronic kidney disease, stage 3, mod decreased GFR (HCC)    Creatinine of 1.84 in 06/2011 and 1.5 in 07/2011  . Diabetes mellitus    Insulin  . Dysrhythmia    AFlutter  . GERD (gastroesophageal reflux disease)   . Gout   . Hyperlipidemia   . Hypertension   . Injection site reaction   . Multiple myeloma 07/01/2011  . Myocardial infarction ([place]) 2000  . Obesity   . Pedal edema    Venous insufficiency  . Sleep apnea    uses cpap  . Ulcer      Past Surgical History:  Procedure Laterality Date  . ABSCESS DRAINAGE     Scrotal  . BIOPSY  01/02/2012   Procedure: BIOPSY;  Surgeon: Rogene Houston, MD;  [facility];  Service: Endoscopy;  Laterality: N/A;  . BONE MARROW BIOPSY  05/13/11  . CARDIAC CATHETERIZATION     cardiac stent  . CARDIOVERSION N/A 10/13/2012   Procedure: CARDIOVERSION;  Surgeon: Yehuda Savannah, MD;  [facility];  Service: Cardiovascular;  Laterality: N/A;  . CATARACT EXTRACTION W/PHACO Left 02/13/2014   Procedure: CATARACT EXTRACTION PHACO AND INTRAOCULAR LENS PLACEMENT ([place]);  Surgeon: Tonny Branch, MD;  Location[facility];  Service: Ophthalmology;  Laterality: Left;  CDE:  7.67  . CATARACT EXTRACTION W/PHACO Right 03/02/2014   Procedure: CATARACT EXTRACTION PHACO AND INTRAOCULAR LENS PLACEMENT RIGHT EYE CDE=16.81;  Surgeon:  Tonny Branch, MD;  [facility];  Service: Ophthalmology;  Laterality: Right;  . COLONOSCOPY  11/28/2011   Procedure: COLONOSCOPY;  Surgeon: Rogene Houston, MD;  [facility];  Service: Endoscopy;  Laterality: N/A;  930  . CYSTOSCOPY W/ RETROGRADES Right 12/16/2017   Procedure: CYSTOSCOPY WITH RIGHT RETROGRADE PYELOGRAM AND RIGHT URETERAL STENT REMOVAL;  Surgeon: Cleon Gustin, MD;  [facility];  Service: Urology;  Laterality: Right;  . CYSTOSCOPY W/ URETERAL STENT PLACEMENT Right 07/31/2017   Procedure:  CYSTOSCOPY WITH RETROGRADE PYELOGRAM/URETERAL STENT PLACEMENT;  Surgeon: Cleon Gustin, MD;  [facility];  Service: Urology;  Laterality: Right;  . ESOPHAGOGASTRODUODENOSCOPY  01/02/2012   Procedure: ESOPHAGOGASTRODUODENOSCOPY (EGD);  Surgeon: Rogene Houston, MD;  [facility];  Service: Endoscopy;  Laterality: N/A;  100  . ESOPHAGOGASTRODUODENOSCOPY N/A 09/20/2012   Procedure: ESOPHAGOGASTRODUODENOSCOPY (EGD);  Surgeon: Rogene Houston, MD;  [facility];  Service: Endoscopy;  Laterality: N/A;  . EUS N/A 10/07/2012   Procedure: UPPER ENDOSCOPIC ULTRASOUND (EUS) LINEAR;  Surgeon: Milus Banister, MD;  [facility];  Service: Endoscopy;  Laterality: N/A;  . INCISION AND DRAINAGE ABSCESS N/A 04/14/2017   Procedure: INCISION AND DRAINAGE ABSCESS;  Surgeon: Ceasar Mons, MD;  [facility];  Service: Urology;  Laterality: N/A;  . INCISION AND DRAINAGE ABSCESS ANAL    . IRRIGATION AND DEBRIDEMENT ABSCESS N/A 04/15/2017   Procedure: DEBRIDEMENT SCROTAL WOUND AND DRESSING CHANGE;  Surgeon: Ceasar Mons, MD;  [facility];  Service: Urology;  Laterality: N/A;  . IRRIGATION AND DEBRIDEMENT ABSCESS N/A 04/17/2017   Procedure: IRRIGATION AND DEBRIDEMENT ABSCESS;  Surgeon: Ceasar Mons, MD;  [facility];  Service: Urology;  Laterality: N/A;  RM 3  . LAPAROSCOPIC GASTRIC BANDING  2006   has been removed  . OSTECTOMY Right 04/08/2017   Procedure: OSTECTOMY RIGHT GREAT TOE;  Surgeon: Caprice Beaver, DPM;  [facility];  Service: Podiatry;  Laterality: Right;  . PORT-A-CATH REMOVAL Left 12/07/2012   Procedure: REMOVAL PORT-A-CATH;  Surgeon: Scherry Ran, MD;  Location[facility];  Service: General;  Laterality: Left;  . PORT-A-CATH REMOVAL N/A 10/02/2017   Procedure: MINOR REMOVAL PORT-A-CATH AT BEDSIDE;  Surgeon: Donnie Mesa, MD;  Location: [place];  Service: General;  Laterality: N/A;  . PORTACATH PLACEMENT   07/07/2011   Procedure: INSERTION PORT-A-CATH;  Surgeon: Scherry Ran, MD;  [facility];  Service: General;  Laterality: N/A;  . PORTACATH PLACEMENT N/A 12/07/2012   Procedure: INSERTION PORT-A-CATH;  Surgeon: Scherry Ran, MD;  [facility];  Service: General;  Laterality: N/A;  Attempted portacath placement on left and right side  . URETEROSCOPY Right 12/16/2017   Procedure: DIAGNOSTIC RIGHT URETEROSCOPY;  Surgeon: Cleon Gustin, MD;  [facility];  Service: Urology;  Laterality: Right;  . WOUND DEBRIDEMENT Right 04/08/2017   Procedure: EXCISION ULCERATION RIGHT GREAT TOE;  Surgeon: Caprice Beaver, DPM;  Location[facility];  Service: Podiatry;  Laterality: Right;  . WRIST SURGERY     Left; removal of bone fragment    Allergies  Allergen Reactions  . Beef-Derived Products Diarrhea  . Pork-Derived Products Diarrhea    Social History   Socioeconomic History  . Marital status: Married    Spouse name: Not on file  . Number of children: Not on file  . Years of education: 59  . Highest education level: Not on file  Occupational History  . Occupation: Teacher, music  Director  . Occupation: Optometrist: [employer]  Tobacco Use  . Smoking status: Former Smoker    Packs/day: 0.25    Years: 1.00    Pack years: 0.25    Types: Cigarettes, Cigars    Quit date: 05/17/2001    Years since quitting: 19.1  . Smokeless tobacco: Never Used  Vaping Use  . Vaping Use: Never used  Substance and Sexual Activity  . Alcohol use: No    Alcohol/week: 0.0 standard drinks  . Drug use: No  . Sexual activity: Yes    Birth control/protection: None  Other Topics Concern  . Not on file  Social History Narrative  . Not on file   Social Determinants of Health   Financial Resource Strain: Not on file  Food Insecurity: Not on file  Transportation Needs: Not on file  Physical Activity: Not on file  Stress: Not on file  Social Connections: Not on file   Intimate Partner Violence: Not on file    Family History  Problem Relation Age of Onset  . Heart disease Mother   . Cancer Mother   . Diabetes Father   . Arthritis Other   . Anesthesia problems Neg Hx   . Hypotension Neg Hx   . Malignant hyperthermia Neg Hx   . Pseudochol deficiency Neg Hx     Review of Systems Shortness of breath with activity Acid heartburn and indigestion Weight gain Abdominal pain Irregular heartbeat Feet swelling Joint stiffness    Objective:   Physical Exam  Gen. Pleasant, obese, in no distress, normal affect ENT - no pallor,icterus, no post nasal drip, class 2 airway Neck: No JVD, no thyromegaly, no carotid bruits Lungs: no use of accessory muscles, no dullness to percussion, decreased without rales or rhonchi  Cardiovascular: Rhythm regular, heart sounds  normal, no murmurs or gallops, 1 peripheral edema Abdomen: soft and non-tender, no hepatosplenomegaly, BS normal. Musculoskeletal: No deformities, no cyanosis or clubbing Neuro:  alert, non focal, no tremors       Assessment & Plan:

## 2020-07-19 NOTE — Addendum Note (Signed)
Addended by: Merrilee Seashore on: 07/19/2020 11:10 AM   Modules accepted: Orders

## 2020-07-19 NOTE — Assessment & Plan Note (Signed)
Shortness of breath is likely related to deconditioning, obesity and chronic systolic heart failure.  Diuretics has been adjusted and he has been placed on guideline directed therapy with hydralazine and nitrates due to CKD

## 2020-07-19 NOTE — Patient Instructions (Signed)
Home sleep test Based on this, we will get you a new CPAP machine 

## 2020-07-19 NOTE — Assessment & Plan Note (Signed)
He has been maintained on CPAP for several years with significant benefits.  CPAP is certainly helped improve his daytime somnolence and fatigue.  He cannot sleep without his machine. We will obtain another home sleep test to reassess only because we do not have his baseline study.  I do not doubt that he has OSA.  In fact I am only putting him through this for DME and insurance purposes. Based on this, we will get him a new auto CPAP machine 5 to 20 cm.  In fact we will go ahead and place the prescription now given the backlog in obtaining machines so that he does not have a long wait.  If desaturations are extensive, then we may consider a formal titration study.  He seems to like his nasal mask and we will stick to that interface ABG from 2019 was reviewed which does not show any evidence of hypercarbia.  My concern would simply be for central apneas given his history of systolic heart failure

## 2020-07-20 ENCOUNTER — Ambulatory Visit: Payer: BC Managed Care – PPO | Admitting: Student

## 2020-07-24 DIAGNOSIS — E1142 Type 2 diabetes mellitus with diabetic polyneuropathy: Secondary | ICD-10-CM | POA: Diagnosis not present

## 2020-07-27 DIAGNOSIS — Z79899 Other long term (current) drug therapy: Secondary | ICD-10-CM | POA: Diagnosis not present

## 2020-07-27 DIAGNOSIS — D649 Anemia, unspecified: Secondary | ICD-10-CM | POA: Diagnosis not present

## 2020-07-27 DIAGNOSIS — C9000 Multiple myeloma not having achieved remission: Secondary | ICD-10-CM | POA: Diagnosis not present

## 2020-07-27 NOTE — Addendum Note (Signed)
Addended by: Levonne Hubert on: 07/27/2020 09:38 AM   Modules accepted: Orders

## 2020-07-28 LAB — MAGNESIUM: Magnesium: 2.1 mg/dL (ref 1.6–2.3)

## 2020-08-03 ENCOUNTER — Other Ambulatory Visit: Payer: Self-pay

## 2020-08-03 ENCOUNTER — Ambulatory Visit: Payer: BC Managed Care – PPO

## 2020-08-03 DIAGNOSIS — G4733 Obstructive sleep apnea (adult) (pediatric): Secondary | ICD-10-CM | POA: Diagnosis not present

## 2020-08-03 DIAGNOSIS — G4730 Sleep apnea, unspecified: Secondary | ICD-10-CM

## 2020-08-07 DIAGNOSIS — E1142 Type 2 diabetes mellitus with diabetic polyneuropathy: Secondary | ICD-10-CM | POA: Diagnosis not present

## 2020-08-07 DIAGNOSIS — L97511 Non-pressure chronic ulcer of other part of right foot limited to breakdown of skin: Secondary | ICD-10-CM | POA: Diagnosis not present

## 2020-08-07 DIAGNOSIS — L97522 Non-pressure chronic ulcer of other part of left foot with fat layer exposed: Secondary | ICD-10-CM | POA: Diagnosis not present

## 2020-08-08 ENCOUNTER — Telehealth: Payer: Self-pay | Admitting: Pulmonary Disease

## 2020-08-08 DIAGNOSIS — G4733 Obstructive sleep apnea (adult) (pediatric): Secondary | ICD-10-CM | POA: Diagnosis not present

## 2020-08-08 NOTE — Telephone Encounter (Signed)
HST showed severe  OSA with AHI 50/ hr  Please process order for autoCPAP that was previously placed  OV with me/[facility] in 6 wks

## 2020-08-08 NOTE — Telephone Encounter (Signed)
I have order and will send to dme once we have scanned hst.

## 2020-08-09 DIAGNOSIS — E872 Acidosis: Secondary | ICD-10-CM | POA: Diagnosis not present

## 2020-08-09 DIAGNOSIS — N189 Chronic kidney disease, unspecified: Secondary | ICD-10-CM | POA: Diagnosis not present

## 2020-08-09 DIAGNOSIS — I5022 Chronic systolic (congestive) heart failure: Secondary | ICD-10-CM | POA: Diagnosis not present

## 2020-08-09 DIAGNOSIS — D638 Anemia in other chronic diseases classified elsewhere: Secondary | ICD-10-CM | POA: Diagnosis not present

## 2020-08-09 NOTE — Telephone Encounter (Signed)
Called patient and went over HST results per Dr Elsworth Soho with patient. Patient also made aware that previous order for autoCPAP has been sent in to [facility] and patient expressed full understanding to call to schedule 6 week follow up office visit with Dr Elsworth Soho once CPAP is set up in home. Nothing further needed at this time.

## 2020-08-09 NOTE — Telephone Encounter (Signed)
I have sent order to [facility].  Nothing further needed.

## 2020-08-14 DIAGNOSIS — L97511 Non-pressure chronic ulcer of other part of right foot limited to breakdown of skin: Secondary | ICD-10-CM | POA: Diagnosis not present

## 2020-08-14 DIAGNOSIS — E1142 Type 2 diabetes mellitus with diabetic polyneuropathy: Secondary | ICD-10-CM | POA: Diagnosis not present

## 2020-08-14 DIAGNOSIS — L97521 Non-pressure chronic ulcer of other part of left foot limited to breakdown of skin: Secondary | ICD-10-CM | POA: Diagnosis not present

## 2020-08-17 ENCOUNTER — Telehealth: Payer: Self-pay | Admitting: Pulmonary Disease

## 2020-08-17 NOTE — Telephone Encounter (Signed)
Spoke to pt & explained no more cpap assistance program.  [employer] does have some type of hardship assistance if he wants to switch dme's & try to apply for that but they have wait list for cpaps now.  Pt states [employer] has machine in stock & he doesn't want to be put on wait list.  He will stay with [employer].  Nothing further needed.

## 2020-08-17 NOTE — Telephone Encounter (Signed)
PCC"s do yall know of any patient assistance programs for getting a new cpap machine?  thanks

## 2020-08-21 ENCOUNTER — Other Ambulatory Visit (HOSPITAL_COMMUNITY): Payer: BC Managed Care – PPO

## 2020-08-21 DIAGNOSIS — G4733 Obstructive sleep apnea (adult) (pediatric): Secondary | ICD-10-CM | POA: Diagnosis not present

## 2020-08-27 ENCOUNTER — Other Ambulatory Visit (HOSPITAL_COMMUNITY): Payer: Self-pay

## 2020-08-27 DIAGNOSIS — D638 Anemia in other chronic diseases classified elsewhere: Secondary | ICD-10-CM | POA: Diagnosis not present

## 2020-08-27 DIAGNOSIS — N189 Chronic kidney disease, unspecified: Secondary | ICD-10-CM | POA: Diagnosis not present

## 2020-08-27 DIAGNOSIS — E1122 Type 2 diabetes mellitus with diabetic chronic kidney disease: Secondary | ICD-10-CM | POA: Diagnosis not present

## 2020-08-27 DIAGNOSIS — D649 Anemia, unspecified: Secondary | ICD-10-CM | POA: Diagnosis not present

## 2020-08-27 DIAGNOSIS — C9000 Multiple myeloma not having achieved remission: Secondary | ICD-10-CM

## 2020-08-27 DIAGNOSIS — E872 Acidosis: Secondary | ICD-10-CM | POA: Diagnosis not present

## 2020-08-28 ENCOUNTER — Inpatient Hospital Stay (HOSPITAL_COMMUNITY): Payer: BC Managed Care – PPO | Attending: Hematology | Admitting: Oncology

## 2020-08-28 ENCOUNTER — Other Ambulatory Visit: Payer: Self-pay

## 2020-08-28 VITALS — BP 152/69 | HR 86 | Temp 97.0°F | Resp 21 | Wt 392.4 lb

## 2020-08-28 DIAGNOSIS — D649 Anemia, unspecified: Secondary | ICD-10-CM

## 2020-08-28 DIAGNOSIS — C9000 Multiple myeloma not having achieved remission: Secondary | ICD-10-CM

## 2020-08-28 DIAGNOSIS — N189 Chronic kidney disease, unspecified: Secondary | ICD-10-CM | POA: Diagnosis not present

## 2020-08-28 DIAGNOSIS — L97522 Non-pressure chronic ulcer of other part of left foot with fat layer exposed: Secondary | ICD-10-CM | POA: Diagnosis not present

## 2020-08-28 DIAGNOSIS — Z794 Long term (current) use of insulin: Secondary | ICD-10-CM | POA: Insufficient documentation

## 2020-08-28 DIAGNOSIS — M1990 Unspecified osteoarthritis, unspecified site: Secondary | ICD-10-CM | POA: Insufficient documentation

## 2020-08-28 DIAGNOSIS — I2510 Atherosclerotic heart disease of native coronary artery without angina pectoris: Secondary | ICD-10-CM | POA: Insufficient documentation

## 2020-08-28 DIAGNOSIS — G4733 Obstructive sleep apnea (adult) (pediatric): Secondary | ICD-10-CM | POA: Diagnosis not present

## 2020-08-28 DIAGNOSIS — Z79899 Other long term (current) drug therapy: Secondary | ICD-10-CM | POA: Insufficient documentation

## 2020-08-28 DIAGNOSIS — I509 Heart failure, unspecified: Secondary | ICD-10-CM | POA: Insufficient documentation

## 2020-08-28 DIAGNOSIS — L97511 Non-pressure chronic ulcer of other part of right foot limited to breakdown of skin: Secondary | ICD-10-CM | POA: Diagnosis not present

## 2020-08-28 DIAGNOSIS — E1122 Type 2 diabetes mellitus with diabetic chronic kidney disease: Secondary | ICD-10-CM | POA: Diagnosis not present

## 2020-08-28 DIAGNOSIS — E1142 Type 2 diabetes mellitus with diabetic polyneuropathy: Secondary | ICD-10-CM | POA: Diagnosis not present

## 2020-08-28 DIAGNOSIS — I130 Hypertensive heart and chronic kidney disease with heart failure and stage 1 through stage 4 chronic kidney disease, or unspecified chronic kidney disease: Secondary | ICD-10-CM | POA: Insufficient documentation

## 2020-08-28 NOTE — Progress Notes (Signed)
[name], [place] [address]   CLINIC:  Medical Oncology/Hematology  PCP:  [name], [address] / [place] [address]  [phone]  REASON FOR VISIT:  Follow-up for multiple myeloma  CURRENT THERAPY: Observation  INTERVAL HISTORY:  [name], a 60 y.o. male, returns for routine follow-up for his multiple myeloma. [name] was last seen on 12/28/2019.   In the interim, patient has been doing well.  He has many chronic comorbidities including type 2 diabetes, CKD, IBS, atrial flutter, CAD with stents, CHF, morbid obesity, OSA, gout, hypertension, hyperlipidemia, GERD and history of foreign years gangrene and scrotal abscess.  He is followed by cardiology for CAD and CHF and by [name] for CKD.  Over the past 2 to 3 months they have been manipulating his medications in an attempt to manage his blood pressure and fluctuating fluid status.  He is finding this process very exhausting and draining.  He has been having chronic intermittent explosive diarrhea for which he takes Viberzi which is contributing to his dehydration.  He attempts to drink plenty of fluids.  His appetite is stable.  Reports weight gain.  He is inactive secondary to weakness in his legs from sedentary lifestyle, morbid obesity and history of gangrene.  He denies any pain.   REVIEW OF SYSTEMS:  Review of Systems  Constitutional: Negative.   HENT:  Negative.   Eyes: Negative.   Respiratory: Negative.   Cardiovascular: Negative.   Gastrointestinal: Negative.   Endocrine: Negative.   Genitourinary: Negative.    Musculoskeletal: Negative.   Skin: Negative.   Neurological: Negative.   Hematological: Negative.   Psychiatric/Behavioral: Negative.   All other systems reviewed and are negative.   PAST MEDICAL/SURGICAL HISTORY:  Past Medical History:  Diagnosis Date  . Abscess 04/2017   Scrotal  . Anemia   . Arteriosclerotic cardiovascular disease  (ASCVD)    MI-2000s; stent to the proximal LAD and diagonal in 2001; stress nuclear in 2008-impaired exercise capacity, left ventricular dilatation, moderately to severely depressed EF, apical, inferior and anteroseptal scar  . Arthritis   . Atrial flutter ([place])   . 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 Cellulitis of leg    both legs  . Chronic diarrhea   . Chronic kidney disease, stage 3, mod decreased GFR (HCC)    Creatinine of 1.84 in 06/2011 and 1.5 in 07/2011  . Diabetes mellitus    Insulin  . Dysrhythmia    AFlutter  . GERD (gastroesophageal reflux disease)   . Gout   . Hyperlipidemia   . Hypertension   . Injection site reaction   . Multiple myeloma 07/01/2011  . Myocardial infarction ([place]) 2000  . Obesity   . Pedal edema    Venous insufficiency  . Sleep apnea    uses cpap  . Ulcer    Past Surgical History:  Procedure Laterality Date  . ABSCESS DRAINAGE     Scrotal  . BIOPSY  01/02/2012   Procedure: BIOPSY;  Surgeon: Rogene Houston, MD;  [facility];  Service: Endoscopy;  Laterality: N/A;  . BONE MARROW BIOPSY  05/13/11  . CARDIAC CATHETERIZATION     cardiac stent  . CARDIOVERSION N/A 10/13/2012   Procedure: CARDIOVERSION;  Surgeon: Yehuda Savannah, MD;  Location: [facility];  Service: Cardiovascular;  Laterality: N/A;  . CATARACT EXTRACTION W/PHACO Left 02/13/2014   Procedure: CATARACT EXTRACTION PHACO AND INTRAOCULAR LENS PLACEMENT ([place]);  Surgeon: Tonny Branch, MD;  Location: [facility];  Service: Ophthalmology;  Laterality: Left;  CDE:  7.67  . CATARACT EXTRACTION W/PHACO Right 03/02/2014   Procedure: CATARACT EXTRACTION PHACO AND INTRAOCULAR LENS PLACEMENT RIGHT EYE CDE=16.81;  Surgeon: Tonny Branch, MD;  Location: [facility];  Service: Ophthalmology;  Laterality: Right;  . COLONOSCOPY  11/28/2011   Procedure: COLONOSCOPY;  Surgeon: Rogene Houston, MD;  [facility];  Service: Endoscopy;  Laterality: N/A;  930  . CYSTOSCOPY W/ RETROGRADES Right 12/16/2017    Procedure: CYSTOSCOPY WITH RIGHT RETROGRADE PYELOGRAM AND RIGHT URETERAL STENT REMOVAL;  Surgeon: Cleon Gustin, MD;  Location: [facility];  Service: Urology;  Laterality: Right;  . CYSTOSCOPY W/ URETERAL STENT PLACEMENT Right 07/31/2017   Procedure: CYSTOSCOPY WITH RETROGRADE PYELOGRAM/URETERAL STENT PLACEMENT;  Surgeon: Cleon Gustin, MD;  [facility];  Service: Urology;  Laterality: Right;  . ESOPHAGOGASTRODUODENOSCOPY  01/02/2012   Procedure: ESOPHAGOGASTRODUODENOSCOPY (EGD);  Surgeon: Rogene Houston, MD;  [facility];  Service: Endoscopy;  Laterality: N/A;  100  . ESOPHAGOGASTRODUODENOSCOPY N/A 09/20/2012   Procedure: ESOPHAGOGASTRODUODENOSCOPY (EGD);  Surgeon: Rogene Houston, MD;  [facility];  Service: Endoscopy;  Laterality: N/A;  . EUS N/A 10/07/2012   Procedure: UPPER ENDOSCOPIC ULTRASOUND (EUS) LINEAR;  Surgeon: Milus Banister, MD;  [facility];  Service: Endoscopy;  Laterality: N/A;  . INCISION AND DRAINAGE ABSCESS N/A 04/14/2017   Procedure: INCISION AND DRAINAGE ABSCESS;  Surgeon: Ceasar Mons, MD;  [facility];  Service: Urology;  Laterality: N/A;  . INCISION AND DRAINAGE ABSCESS ANAL    . IRRIGATION AND DEBRIDEMENT ABSCESS N/A 04/15/2017   Procedure: DEBRIDEMENT SCROTAL WOUND AND DRESSING CHANGE;  Surgeon: Ceasar Mons, MD;  [facility];  Service: Urology;  Laterality: N/A;  . IRRIGATION AND DEBRIDEMENT ABSCESS N/A 04/17/2017   Procedure: IRRIGATION AND DEBRIDEMENT ABSCESS;  Surgeon: Ceasar Mons, MD;  [facility];  Service: Urology;  Laterality: N/A;  RM 3  . LAPAROSCOPIC GASTRIC BANDING  2006   has been removed  . OSTECTOMY Right 04/08/2017   Procedure: OSTECTOMY RIGHT GREAT TOE;  Surgeon: Caprice Beaver, DPM;  Location: [facility];  Service: Podiatry;  Laterality: Right;  . PORT-A-CATH REMOVAL Left 12/07/2012   Procedure: REMOVAL PORT-A-CATH;  Surgeon: Scherry Ran, MD;   Location: [facility];  Service: General;  Laterality: Left;  . PORT-A-CATH REMOVAL N/A 10/02/2017   Procedure: MINOR REMOVAL PORT-A-CATH AT BEDSIDE;  Surgeon: Donnie Mesa, MD;  Location: [facility];  Service: General;  Laterality: N/A;  . PORTACATH PLACEMENT  07/07/2011   Procedure: INSERTION PORT-A-CATH;  Surgeon: Scherry Ran, MD;  Location: [facility];  Service: General;  Laterality: N/A;  . PORTACATH PLACEMENT N/A 12/07/2012   Procedure: INSERTION PORT-A-CATH;  Surgeon: Scherry Ran, MD;  Location: [facility];  Service: General;  Laterality: N/A;  Attempted portacath placement on left and right side  . URETEROSCOPY Right 12/16/2017   Procedure: DIAGNOSTIC RIGHT URETEROSCOPY;  Surgeon: Cleon Gustin, MD;  Location: [facility];  Service: Urology;  Laterality: Right;  . WOUND DEBRIDEMENT Right 04/08/2017   Procedure: EXCISION ULCERATION RIGHT GREAT TOE;  Surgeon: Caprice Beaver, DPM;  Location: [facility];  Service: Podiatry;  Laterality: Right;  . WRIST SURGERY     Left; removal of bone fragment    SOCIAL HISTORY:  Social History   Socioeconomic History  . Marital status: Married    Spouse name: Not on file  . Number  of children: Not on file  . Years of education: 5  . Highest education level: Not on file  Occupational History  . Occupation: Nurse, learning disability  . Occupation: Optometrist: [employer]  Tobacco Use  . Smoking status: Former Smoker    Packs/day: 0.25    Years: 1.00    Pack years: 0.25    Types: Cigarettes, Cigars    Quit date: 05/17/2001    Years since quitting: 19.2  . Smokeless tobacco: Never Used  Vaping Use  . Vaping Use: Never used  Substance and Sexual Activity  . Alcohol use: No    Alcohol/week: 0.0 standard drinks  . Drug use: No  . Sexual activity: Yes    Birth control/protection: None  Other Topics Concern  . Not on file  Social History Narrative  . Not on file   Social Determinants of Health   Financial Resource Strain:  Not on file  Food Insecurity: Not on file  Transportation Needs: Not on file  Physical Activity: Not on file  Stress: Not on file  Social Connections: Not on file  Intimate Partner Violence: Not on file    FAMILY HISTORY:  Family History  Problem Relation Age of Onset  . Heart disease Mother   . Cancer Mother   . Diabetes Father   . Arthritis Other   . Anesthesia problems Neg Hx   . Hypotension Neg Hx   . Malignant hyperthermia Neg Hx   . Pseudochol deficiency Neg Hx     CURRENT MEDICATIONS:  Current Outpatient Medications  Medication Sig Dispense Refill  . acetaminophen (TYLENOL) 500 MG tablet Take 500-1,000 mg by mouth every 6 (six) hours as needed for mild pain or moderate pain.    Marland Kitchen acyclovir (ZOVIRAX) 400 MG tablet Take 1 tablet (400 mg total) by mouth every morning. 30 tablet 5  . allopurinol (ZYLOPRIM) 300 MG tablet TAKE ONE TABLET BY MOUTH ONCE DAILY. 30 tablet 2  . Artificial Tear Ointment (DRY EYES OP) Place 1 drop into both eyes as needed (dry eyes).    Marland Kitchen atorvastatin (LIPITOR) 80 MG tablet TAKE ONE TABLET BY MOUTH AT BEDTIME. 90 tablet 3  . Cholecalciferol (D2000 ULTRA STRENGTH) 50 MCG (2000 UT) CAPS Take by mouth.    . CONTOUR NEXT TEST test strip     . dicyclomine (BENTYL) 20 MG tablet TAKE 1 TABLET BY MOUTH BEFORE MEALS 3 TIMES DAILY. 90 tablet 2  . Eluxadoline 100 MG TABS Take 100 mg 2 (two) times daily with a meal by mouth.     . ferrous sulfate 325 (65 FE) MG EC tablet Take 45 mg by mouth. Every other day    . gabapentin (NEURONTIN) 600 MG tablet TAKE 1 TABLET BY MOUTH ONCE A DAY. (Patient taking differently: Take 600 mg by mouth 2 (two) times daily. Pt takes 1 tablet in a.m. and 1/2 tablet at bedtime) 30 tablet 0  . hydrALAZINE (APRESOLINE) 50 MG tablet Take 1 tablet (50 mg total) by mouth 3 (three) times daily. 270 tablet 3  . insulin aspart (NOVOLOG) 100 UNIT/ML injection Inject into the skin. Sliding scale over 150= 1 unit, 200= 2 units,250= 3 units    .  insulin detemir (LEVEMIR) 100 UNIT/ML injection Inject 0.1 mLs (10 Units total) into the skin every morning. 10 mL 11  . INSULIN SYRINGE .5CC/29G 29G X 1/2" 0.5 ML MISC     . isosorbide mononitrate (IMDUR) 30 MG 24 hr tablet Take 0.5  tablets (15 mg total) by mouth daily. 45 tablet 3  . lisinopril (ZESTRIL) 5 MG tablet Take 5 mg by mouth daily.    Marland Kitchen loratadine (CLARITIN) 10 MG tablet Take 10 mg by mouth every morning.    . Magnesium Cl-Calcium Carbonate (SLOW-MAG PO) Take 1 tablet by mouth every morning.    . Microlet Lancets MISC     . Multiple Vitamins-Minerals (MULTIVITAMINS THER. W/MINERALS) TABS Take 1 tablet by mouth daily.    . nitroGLYCERIN (NITROSTAT) 0.4 MG SL tablet DISSOLVE 1 TABLET UNDER TONGUE EVERY 5 MINUTES UP TO 15 MIN FOR CHESTPAIN. IF NO RELIEF CALL 911. 25 tablet 0  . pantoprazole (PROTONIX) 40 MG tablet Take 40 mg by mouth daily.    . potassium chloride SA (KLOR-CON) 20 MEQ tablet Take 20 mEq by mouth daily. Every other day    . sodium bicarbonate 650 MG tablet Take 650 mg by mouth 3 (three) times daily.    . SURE COMFORT PEN NEEDLES 29G X 12.7MM MISC USE AS DIRECTED FOURCTIMES DAILY.    Marland Kitchen torsemide (DEMADEX) 20 MG tablet Take 2 tablets (40 mg total) by mouth daily. 180 tablet 3  . XARELTO 20 MG TABS tablet TAKE 1 TABLET BY MOUTH DAILY. 30 tablet 0   No current facility-administered medications for this visit.   Facility-Administered Medications Ordered in Other Visits  Medication Dose Route Frequency Provider Last Rate Last Admin  . sodium chloride 0.9 % injection 10 mL  10 mL Intravenous Once Farrel Gobble, MD        ALLERGIES:  Allergies  Allergen Reactions  . Beef-Derived Products Diarrhea  . Pork-Derived Products Diarrhea    PHYSICAL EXAM:  Performance status (ECOG): 1 - Symptomatic but completely ambulatory  Vitals:   08/28/20 1142  BP: (!) 152/69  Pulse: 86  Resp: (!) 21  Temp: (!) 97 F (36.1 C)  SpO2: 97%   Wt Readings from Last 3 Encounters:   08/28/20 (!) 392 lb 6.4 oz (178 kg)  07/19/20 (!) 399 lb 9.6 oz (181.3 kg)  07/11/20 (!) 404 lb 6.4 oz (183.4 kg)   Physical Exam Vitals reviewed.  Constitutional:      Appearance: Normal appearance. He is obese.  HENT:     Mouth/Throat:     Mouth: Mucous membranes are moist.  Eyes:     Pupils: Pupils are equal, round, and reactive to light.  Cardiovascular:     Rate and Rhythm: Normal rate and regular rhythm.     Pulses: Normal pulses.     Heart sounds: Normal heart sounds.  Pulmonary:     Effort: Pulmonary effort is normal.     Breath sounds: Normal breath sounds.  Abdominal:     Palpations: Abdomen is soft. There is no mass.     Tenderness: There is no abdominal tenderness.  Musculoskeletal:     Right lower leg: No edema.     Left lower leg: No edema.  Neurological:     Mental Status: He is alert and oriented to person, place, and time.  Psychiatric:        Mood and Affect: Mood normal.        Behavior: Behavior normal.     LABORATORY DATA:  I have reviewed the labs as listed.  CBC Latest Ref Rng & Units 12/13/2019 06/10/2019 02/01/2019  WBC 4.0 - 10.5 K/uL 6.9 5.7 5.9  Hemoglobin 13.0 - 17.0 g/dL 11.6(L) 11.8(L) 12.8(L)  Hematocrit 39.0 - 52.0 % 37.4(L) 37.5(L) 40.2  Platelets  150 - 400 K/uL 163 188 148(L)   CMP Latest Ref Rng & Units 12/13/2019 06/10/2019 02/01/2019  Glucose 70 - 99 mg/dL 131(H) 157(H) 144(H)  BUN 6 - 20 mg/dL 75(H) 40(H) 55(H)  Creatinine 0.61 - 1.24 mg/dL 2.78(H) 2.26(H) 2.38(H)  Sodium 135 - 145 mmol/L 140 142 139  Potassium 3.5 - 5.1 mmol/L 4.6 4.4 3.8  Chloride 98 - 111 mmol/L 107 113(H) 112(H)  CO2 22 - 32 mmol/L 22 23 19(L)  Calcium 8.9 - 10.3 mg/dL 8.8(L) 8.3(L) 8.5(L)  Total Protein 6.5 - 8.1 g/dL 7.3 6.5 6.7  Total Bilirubin 0.3 - 1.2 mg/dL 0.5 0.6 0.9  Alkaline Phos 38 - 126 U/L 115 101 106  AST 15 - 41 U/L 31 27 41  ALT 0 - 44 U/L 33 27 39      Component Value Date/Time   RBC 3.75 (L) 12/13/2019 1434   MCV 99.7 12/13/2019 1434    MCH 30.9 12/13/2019 1434   MCHC 31.0 12/13/2019 1434   RDW 14.6 12/13/2019 1434   LYMPHSABS 2.3 12/13/2019 1434   MONOABS 0.6 12/13/2019 1434   EOSABS 0.2 12/13/2019 1434   BASOSABS 0.0 12/13/2019 1434    DIAGNOSTIC IMAGING:  I have independently reviewed the scans and discussed with the patient. No results found.    ASSESSMENT:  1.  Multiple myeloma: -Immunofixation has been negative since 2017.  2.  Macrocytic anemia: -He has mild anemia from CKD and relative iron deficiency. -Last Feraheme was on 12/14/2017. -He is taking iron tablet every other day.   PLAN:  1.  Multiple myeloma: -Does not report any new onset bone pains. -Reviewed labs from 08/01/2020, M spike was undetectable.  Immunofixation showed IgM slightly elevated at 228.  His free light chain ratio is 1.64 with elevated kappa light chains at 71.3 and kappa lambda light chains elevated at 43.6 consistent with CKD. -Hemoglobin is 9.2. Creatinine was elevated at 3.36 on 08/01/2020 with improvement on redraw to 2.79 which is approximately his baseline.  His cardiologist and nephrologist have been manipulating his diuretics and monitoring his fluid intake/output. -His potassium level was elevated at 5.4 but on redraw was normal at 4.0. -Overall labs appear stable.  2.  Normocytic anemia: -Hemoglobin is 9.3.  MCV is 94.  -Anemia from CKD and relative iron deficiency. -Ferritin was 182 and percent saturation 32%.  B12 was normal.. -Will discuss with [name], but patient will likely benefit from ESA in near future given decline in hemoglobin and fluctuating kidney function.  Disposition: -No additional IV iron needed at this time. -We will discuss with [name] initiation of ESA in the near future. -RTC in 6 months with repeat labs and MD assessment unless [name] recommends otherwise.  Orders placed this encounter:  No orders of the defined types were placed in this encounter.  Greater than 50%  was spent in counseling and coordination of care with this patient including but not limited to discussion of the relevant topics above (See A&P) including, but not limited to diagnosis and management of acute and chronic medical conditions.   Faythe Casa, NP 08/29/2020 2:19 PM  [place] [phone]

## 2020-08-29 DIAGNOSIS — N189 Chronic kidney disease, unspecified: Secondary | ICD-10-CM | POA: Diagnosis not present

## 2020-08-29 DIAGNOSIS — N170 Acute kidney failure with tubular necrosis: Secondary | ICD-10-CM | POA: Diagnosis not present

## 2020-08-29 DIAGNOSIS — R809 Proteinuria, unspecified: Secondary | ICD-10-CM | POA: Diagnosis not present

## 2020-08-29 DIAGNOSIS — D638 Anemia in other chronic diseases classified elsewhere: Secondary | ICD-10-CM | POA: Diagnosis not present

## 2020-09-04 ENCOUNTER — Ambulatory Visit: Payer: BC Managed Care – PPO | Admitting: Cardiology

## 2020-09-11 DIAGNOSIS — L97529 Non-pressure chronic ulcer of other part of left foot with unspecified severity: Secondary | ICD-10-CM | POA: Diagnosis not present

## 2020-09-11 DIAGNOSIS — E1142 Type 2 diabetes mellitus with diabetic polyneuropathy: Secondary | ICD-10-CM | POA: Diagnosis not present

## 2020-09-11 DIAGNOSIS — L97519 Non-pressure chronic ulcer of other part of right foot with unspecified severity: Secondary | ICD-10-CM | POA: Diagnosis not present

## 2020-09-21 DIAGNOSIS — E1129 Type 2 diabetes mellitus with other diabetic kidney complication: Secondary | ICD-10-CM | POA: Diagnosis not present

## 2020-09-21 DIAGNOSIS — G4733 Obstructive sleep apnea (adult) (pediatric): Secondary | ICD-10-CM | POA: Diagnosis not present

## 2020-09-21 DIAGNOSIS — N189 Chronic kidney disease, unspecified: Secondary | ICD-10-CM | POA: Diagnosis not present

## 2020-09-21 DIAGNOSIS — N170 Acute kidney failure with tubular necrosis: Secondary | ICD-10-CM | POA: Diagnosis not present

## 2020-09-21 DIAGNOSIS — D638 Anemia in other chronic diseases classified elsewhere: Secondary | ICD-10-CM | POA: Diagnosis not present

## 2020-09-21 DIAGNOSIS — E1122 Type 2 diabetes mellitus with diabetic chronic kidney disease: Secondary | ICD-10-CM | POA: Diagnosis not present

## 2020-09-25 DIAGNOSIS — E1142 Type 2 diabetes mellitus with diabetic polyneuropathy: Secondary | ICD-10-CM | POA: Diagnosis not present

## 2020-10-02 ENCOUNTER — Ambulatory Visit: Payer: BC Managed Care – PPO | Admitting: Cardiology

## 2020-10-02 ENCOUNTER — Encounter: Payer: Self-pay | Admitting: Cardiology

## 2020-10-02 ENCOUNTER — Other Ambulatory Visit: Payer: Self-pay

## 2020-10-02 VITALS — BP 128/52 | HR 42 | Ht 74.0 in | Wt 396.0 lb

## 2020-10-02 DIAGNOSIS — I2510 Atherosclerotic heart disease of native coronary artery without angina pectoris: Secondary | ICD-10-CM | POA: Diagnosis not present

## 2020-10-02 DIAGNOSIS — I4892 Unspecified atrial flutter: Secondary | ICD-10-CM | POA: Diagnosis not present

## 2020-10-02 DIAGNOSIS — I5022 Chronic systolic (congestive) heart failure: Secondary | ICD-10-CM

## 2020-10-02 MED ORDER — HYDRALAZINE HCL 50 MG PO TABS: 75.0000 mg | ORAL_TABLET | Freq: Three times a day (TID) | ORAL | 3 refills | Status: DC

## 2020-10-02 NOTE — Patient Instructions (Signed)
Medication Instructions:  Your physician has recommended you make the following change in your medication:  INCREASE hyralazine to 75 mg three times daily  *If you need a refill on your cardiac medications before your next appointment, please call your pharmacy*   Lab Work: None If you have labs (blood work) drawn today and your tests are completely normal, you will receive your results only by: Marland Kitchen MyChart Message (if you have MyChart) OR . A paper copy in the mail If you have any lab test that is abnormal or we need to change your treatment, we will call you to review the results.   Testing/Procedures: None   Follow-Up: At [facility], you and your health needs are our priority.  As part of our continuing mission to provide you with exceptional heart care, we have created designated Provider Care Teams.  These Care Teams include your primary Cardiologist (physician) and Advanced Practice Providers (APPs -  Physician Assistants and Nurse Practitioners) who all work together to provide you with the care you need, when you need it.  We recommend signing up for the patient portal called "MyChart".  Sign up information is provided on this After Visit Summary.  MyChart is used to connect with patients for Virtual Visits (Telemedicine).  Patients are able to view lab/test results, encounter notes, upcoming appointments, etc.  Non-urgent messages can be sent to your provider as well.   To learn more about what you can do with MyChart, go to [email or web address].    Your next appointment:   10 month(s)  The format for your next appointment:   In Person  Provider:   Bernerd Pho, PA-C   Other Instructions

## 2020-10-02 NOTE — Progress Notes (Signed)
Clinical Summary Mr. Wichert is a 60 y.o.male seen today for follow up of the following medical problems.   1. CAD - prior stents as reported below -no recent chest pains.   2. Chronic systolic HF - 4403 echo LVEF 35-40% - 2015 LVEF 25-30% 2017 LVEF 35-40%  - 07/2019 ehco LVEF 25-30%, grade II diastolic dysfxn.  -No ACE inhibitor's or angiotensin receptor blockers due to advanced chronic kidney disease - off beta blocker due to bradycardia from prior Dr Raliegh Ip notes.   - weight 385 12/2019, up to 399 at 06/2020 appt, today 404 lbs - last visit we started hydral/nitrates - at Jan 27 appt with neprhology had increased swelling, torsemide increased to 59m alternating with 283m Sensitive to diuretics given intermittent diarrhea - since change swelling is stable. Home weights 399 lbs last by home  - mild swelling in legs, can feel abdominal distension and some SOB. Chronically sleeps at angle - chronic diarrhea that can exacebate at times.   - home weights normal range typically 386 to 392 lbs - taking torsemide 4076mlternating with 18m42m- home bp's usually 130s/70s-80s     3. Aflutter CrCl is 76 based on calculations.  - no recent palpitations. Feels like he can hear his heart beat at times.   4. CKD IV - followed by Dr BhutTheador Hawthorneast Cr 2.79 and stable  5. OSA - compliant with cpap  6. Multiple myeloma - being monitored    7. HTN - compliant with meds   Works at [employer]   Past Medical History:  Diagnosis Date  . Abscess 04/2017   Scrotal  . Anemia   . Arteriosclerotic cardiovascular disease (ASCVD)    MI-2000s; stent to the proximal LAD and diagonal in 2001; stress nuclear in 2008-impaired exercise capacity, left ventricular dilatation, moderately to severely depressed EF, apical, inferior and anteroseptal scar  . Arthritis   . Atrial flutter (HCC)[place]. Bence-Jones proteinuria  05/05/2011  . Cellulitis of leg    both legs  . Chronic diarrhea   . Chronic kidney disease, stage 3, mod decreased GFR (HCC)    Creatinine of 1.84 in 06/2011 and 1.5 in 07/2011  . Diabetes mellitus    Insulin  . Dysrhythmia    AFlutter  . GERD (gastroesophageal reflux disease)   . Gout   . Hyperlipidemia   . Hypertension   . Injection site reaction   . Multiple myeloma 07/01/2011  . Myocardial infarction (HCC)[place]  . Obesity   . Pedal edema    Venous insufficiency  . Sleep apnea    uses cpap  . Ulcer      Allergies  Allergen Reactions  . Beef-Derived Products Diarrhea  . Pork-Derived Products Diarrhea     Current Outpatient Medications  Medication Sig Dispense Refill  . acetaminophen (TYLENOL) 500 MG tablet Take 500-1,000 mg by mouth every 6 (six) hours as needed for mild pain or moderate pain.    . acMarland Kitchenclovir (ZOVIRAX) 400 MG tablet Take 1 tablet (400 mg total) by mouth every morning. 30 tablet 5  . allopurinol (ZYLOPRIM) 300 MG tablet TAKE ONE TABLET BY MOUTH ONCE DAILY. 30 tablet 2  . Artificial Tear Ointment (DRY EYES OP) Place 1 drop into both eyes as needed (dry eyes).    . atMarland Kitchenrvastatin (LIPITOR) 80 MG tablet TAKE ONE TABLET BY MOUTH AT BEDTIME. 90 tablet 3  . Cholecalciferol (D2000 ULTRA STRENGTH) 50  MCG (2000 UT) CAPS Take by mouth.    . CONTOUR NEXT TEST test strip     . dicyclomine (BENTYL) 20 MG tablet TAKE 1 TABLET BY MOUTH BEFORE MEALS 3 TIMES DAILY. 90 tablet 2  . Eluxadoline 100 MG TABS Take 100 mg 2 (two) times daily with a meal by mouth.     . ferrous sulfate 325 (65 FE) MG EC tablet Take 45 mg by mouth. Every other day    . gabapentin (NEURONTIN) 600 MG tablet TAKE 1 TABLET BY MOUTH ONCE A DAY. (Patient taking differently: Take 600 mg by mouth 2 (two) times daily. Pt takes 1 tablet in a.m. and 1/2 tablet at bedtime) 30 tablet 0  . hydrALAZINE (APRESOLINE) 50 MG tablet Take 1 tablet (50 mg total) by mouth 3 (three) times daily. 270 tablet 3  . insulin  aspart (NOVOLOG) 100 UNIT/ML injection Inject into the skin. Sliding scale over 150= 1 unit, 200= 2 units,250= 3 units    . insulin detemir (LEVEMIR) 100 UNIT/ML injection Inject 0.1 mLs (10 Units total) into the skin every morning. 10 mL 11  . INSULIN SYRINGE .5CC/29G 29G X 1/2" 0.5 ML MISC     . isosorbide mononitrate (IMDUR) 30 MG 24 hr tablet Take 0.5 tablets (15 mg total) by mouth daily. 45 tablet 3  . lisinopril (ZESTRIL) 5 MG tablet Take 5 mg by mouth daily.    Marland Kitchen loratadine (CLARITIN) 10 MG tablet Take 10 mg by mouth every morning.    . Magnesium Cl-Calcium Carbonate (SLOW-MAG PO) Take 1 tablet by mouth every morning.    . Microlet Lancets MISC     . Multiple Vitamins-Minerals (MULTIVITAMINS THER. W/MINERALS) TABS Take 1 tablet by mouth daily.    . nitroGLYCERIN (NITROSTAT) 0.4 MG SL tablet DISSOLVE 1 TABLET UNDER TONGUE EVERY 5 MINUTES UP TO 15 MIN FOR CHESTPAIN. IF NO RELIEF CALL 911. 25 tablet 0  . pantoprazole (PROTONIX) 40 MG tablet Take 40 mg by mouth daily.    . potassium chloride SA (KLOR-CON) 20 MEQ tablet Take 20 mEq by mouth daily. Every other day    . sodium bicarbonate 650 MG tablet Take 650 mg by mouth 3 (three) times daily.    . SURE COMFORT PEN NEEDLES 29G X 12.7MM MISC USE AS DIRECTED FOURCTIMES DAILY.    Marland Kitchen torsemide (DEMADEX) 20 MG tablet Take 2 tablets (40 mg total) by mouth daily. 180 tablet 3  . XARELTO 20 MG TABS tablet TAKE 1 TABLET BY MOUTH DAILY. 30 tablet 0   No current facility-administered medications for this visit.   Facility-Administered Medications Ordered in Other Visits  Medication Dose Route Frequency Provider Last Rate Last Admin  . sodium chloride 0.9 % injection 10 mL  10 mL Intravenous Once Farrel Gobble, MD         Past Surgical History:  Procedure Laterality Date  . ABSCESS DRAINAGE     Scrotal  . BIOPSY  01/02/2012   Procedure: BIOPSY;  Surgeon: Rogene Houston, MD;  [facility];  Service: Endoscopy;  Laterality: N/A;  .  BONE MARROW BIOPSY  05/13/11  . CARDIAC CATHETERIZATION     cardiac stent  . CARDIOVERSION N/A 10/13/2012   Procedure: CARDIOVERSION;  Surgeon: Yehuda Savannah, MD;  Location: [facility];  Service: Cardiovascular;  Laterality: N/A;  . CATARACT EXTRACTION W/PHACO Left 02/13/2014   Procedure: CATARACT EXTRACTION PHACO AND INTRAOCULAR LENS PLACEMENT ([place]);  Surgeon: Tonny , MD;  Location: [facility];  Service: Ophthalmology;  Laterality: Left;  CDE:  7.67  . CATARACT EXTRACTION W/PHACO Right 03/02/2014   Procedure: CATARACT EXTRACTION PHACO AND INTRAOCULAR LENS PLACEMENT RIGHT EYE CDE=16.81;  Surgeon: Tonny , MD;  Location: [facility];  Service: Ophthalmology;  Laterality: Right;  . COLONOSCOPY  11/28/2011   Procedure: COLONOSCOPY;  Surgeon: Rogene Houston, MD;  [facility];  Service: Endoscopy;  Laterality: N/A;  930  . CYSTOSCOPY W/ RETROGRADES Right 12/16/2017   Procedure: CYSTOSCOPY WITH RIGHT RETROGRADE PYELOGRAM AND RIGHT URETERAL STENT REMOVAL;  Surgeon: Cleon Gustin, MD;  Location: [facility];  Service: Urology;  Laterality: Right;  . CYSTOSCOPY W/ URETERAL STENT PLACEMENT Right 07/31/2017   Procedure: CYSTOSCOPY WITH RETROGRADE PYELOGRAM/URETERAL STENT PLACEMENT;  Surgeon: Cleon Gustin, MD;  Location: [facility];  Service: Urology;  Laterality: Right;  . ESOPHAGOGASTRODUODENOSCOPY  01/02/2012   Procedure: ESOPHAGOGASTRODUODENOSCOPY (EGD);  Surgeon: Rogene Houston, MD;  [facility];  Service: Endoscopy;  Laterality: N/A;  100  . ESOPHAGOGASTRODUODENOSCOPY N/A 09/20/2012   Procedure: ESOPHAGOGASTRODUODENOSCOPY (EGD);  Surgeon: Rogene Houston, MD;  [facility];  Service: Endoscopy;  Laterality: N/A;  . EUS N/A 10/07/2012   Procedure: UPPER ENDOSCOPIC ULTRASOUND (EUS) LINEAR;  Surgeon: Milus Banister, MD;  [facility];  Service: Endoscopy;  Laterality: N/A;  . INCISION AND DRAINAGE ABSCESS N/A 04/14/2017   Procedure: INCISION AND DRAINAGE ABSCESS;   Surgeon: Ceasar Mons, MD;  [facility];  Service: Urology;  Laterality: N/A;  . INCISION AND DRAINAGE ABSCESS ANAL    . IRRIGATION AND DEBRIDEMENT ABSCESS N/A 04/15/2017   Procedure: DEBRIDEMENT SCROTAL WOUND AND DRESSING CHANGE;  Surgeon: Ceasar Mons, MD;  [facility];  Service: Urology;  Laterality: N/A;  . IRRIGATION AND DEBRIDEMENT ABSCESS N/A 04/17/2017   Procedure: IRRIGATION AND DEBRIDEMENT ABSCESS;  Surgeon: Ceasar Mons, MD;  [facility];  Service: Urology;  Laterality: N/A;  RM 3  . LAPAROSCOPIC GASTRIC BANDING  2006   has been removed  . OSTECTOMY Right 04/08/2017   Procedure: OSTECTOMY RIGHT GREAT TOE;  Surgeon: Caprice Beaver, DPM;  Location: [facility];  Service: Podiatry;  Laterality: Right;  . PORT-A-CATH REMOVAL Left 12/07/2012   Procedure: REMOVAL PORT-A-CATH;  Surgeon: Scherry Ran, MD;  [facility];  Service: General;  Laterality: Left;  . PORT-A-CATH REMOVAL N/A 10/02/2017   Procedure: MINOR REMOVAL PORT-A-CATH AT BEDSIDE;  Surgeon: Donnie Mesa, MD;  Location: [facility];  Service: General;  Laterality: N/A;  . PORTACATH PLACEMENT  07/07/2011   Procedure: INSERTION PORT-A-CATH;  Surgeon: Scherry Ran, MD;  [facility];  Service: General;  Laterality: N/A;  . PORTACATH PLACEMENT N/A 12/07/2012   Procedure: INSERTION PORT-A-CATH;  Surgeon: Scherry Ran, MD;  [facility];  Service: General;  Laterality: N/A;  Attempted portacath placement on left and right side  . URETEROSCOPY Right 12/16/2017   Procedure: DIAGNOSTIC RIGHT URETEROSCOPY;  Surgeon: Cleon Gustin, MD;  [facility];  Service: Urology;  Laterality: Right;  . WOUND DEBRIDEMENT Right 04/08/2017   Procedure: EXCISION ULCERATION RIGHT GREAT TOE;  Surgeon: Caprice Beaver, DPM;  Location: [facility];  Service: Podiatry;  Laterality: Right;  . WRIST SURGERY     Left; removal of bone fragment     Allergies  Allergen Reactions  .  Beef-Derived Products Diarrhea  . Pork-Derived Products Diarrhea      Family History  Problem Relation Age of Onset  . Heart disease Mother   . Cancer  Mother   . Diabetes Father   . Arthritis Other   . Anesthesia problems Neg Hx   . Hypotension Neg Hx   . Malignant hyperthermia Neg Hx   . Pseudochol deficiency Neg Hx      Social History Mr. Gerety reports that he quit smoking about 19 years ago. His smoking use included cigarettes and cigars. He has a 0.25 pack-year smoking history. He has never used smokeless tobacco. Mr. Mcphee reports no history of alcohol use.   Review of Systems CONSTITUTIONAL: No weight loss, fever, chills, weakness or fatigue.  HEENT: Eyes: No visual loss, blurred vision, double vision or yellow sclerae.No hearing loss, sneezing, congestion, runny nose or sore throat.  SKIN: No rash or itching.  CARDIOVASCULAR: per hpi RESPIRATORY: No shortness of breath, cough or sputum.  GASTROINTESTINAL: No anorexia, nausea, vomiting or diarrhea. No abdominal pain or blood.  GENITOURINARY: No burning on urination, no polyuria NEUROLOGICAL: No headache, dizziness, syncope, paralysis, ataxia, numbness or tingling in the extremities. No change in bowel or bladder control.  MUSCULOSKELETAL: No muscle, back pain, joint pain or stiffness.  LYMPHATICS: No enlarged nodes. No history of splenectomy.  PSYCHIATRIC: No history of depression or anxiety.  ENDOCRINOLOGIC: No reports of sweating, cold or heat intolerance. No polyuria or polydipsia.  Marland Kitchen   Physical Examination Today's Vitals   10/02/20 1451  BP: (!) 128/52  Pulse: (!) 42  SpO2: 98%  Weight: (!) 396 lb (179.6 kg)  Height: _0  (1.88 m)   Body mass index is 50.84 kg/m.  Gen: resting comfortably, no acute distress HEENT: no scleral icterus, pupils equal round and reactive, no palptable cervical adenopathy,  CV: RRR, no m/r/g, no jvd Resp: Clear to auscultation bilaterally GI: abdomen is soft,  non-tender, non-distended, normal bowel sounds, no hepatosplenomegaly MSK: extremities are warm, no edema.  Skin: warm, no rash Neuro:  no focal deficits Psych: appropriate affect   Diagnostic Studies     Assessment and Plan  1. Chronic systolic HF - several year history of chronic systolic HF, ICM - medical therapy limited. Bradycardia on beta blockers now off. Significant renal dysfunction, on just low dose ACEI per renal. Not a candidate for farxiga given GFR - diuretics have been difficult, intermittent chronic diarrhea with fluid losses that has led to AKI in the past on diuretics. Currently on torsemide 69m alt days with 274m -inccrease hydralzine to 7533mid. Further increase hydal and imdur over follow ups - will need repeat echo next few months to consider ICD   2. Aflutter  - not on av nodal agents due to prior bradycardia - no symptoms continue current meds  3. CAD - no recent symptoms, continue current beds  4. Bradycardia - dynamap rate 42 however EKG shows sinus with long PR interval, PVCs. I suspect PVCs are reason for lower dynamap rates    JonArnoldo Lenis.D.

## 2020-10-08 ENCOUNTER — Ambulatory Visit: Payer: BC Managed Care – PPO | Admitting: Pulmonary Disease

## 2020-10-09 DIAGNOSIS — L97521 Non-pressure chronic ulcer of other part of left foot limited to breakdown of skin: Secondary | ICD-10-CM | POA: Diagnosis not present

## 2020-10-09 DIAGNOSIS — E1142 Type 2 diabetes mellitus with diabetic polyneuropathy: Secondary | ICD-10-CM | POA: Diagnosis not present

## 2020-10-11 DIAGNOSIS — R809 Proteinuria, unspecified: Secondary | ICD-10-CM | POA: Diagnosis not present

## 2020-10-11 DIAGNOSIS — D631 Anemia in chronic kidney disease: Secondary | ICD-10-CM | POA: Diagnosis not present

## 2020-10-11 DIAGNOSIS — N189 Chronic kidney disease, unspecified: Secondary | ICD-10-CM | POA: Diagnosis not present

## 2020-10-11 DIAGNOSIS — D638 Anemia in other chronic diseases classified elsewhere: Secondary | ICD-10-CM | POA: Diagnosis not present

## 2020-10-15 NOTE — Discharge Instructions (Signed)

## 2020-10-16 ENCOUNTER — Other Ambulatory Visit: Payer: Self-pay

## 2020-10-16 ENCOUNTER — Encounter (HOSPITAL_COMMUNITY): Payer: Self-pay

## 2020-10-16 ENCOUNTER — Encounter (HOSPITAL_COMMUNITY)
Admission: RE | Admit: 2020-10-16 | Discharge: 2020-10-16 | Disposition: A | Discharge status: Home / Self Care | Payer: BC Managed Care – PPO | Source: Ambulatory Visit | Attending: Nephrology | Admitting: Nephrology

## 2020-10-16 DIAGNOSIS — D631 Anemia in chronic kidney disease: Secondary | ICD-10-CM | POA: Insufficient documentation

## 2020-10-16 DIAGNOSIS — N184 Chronic kidney disease, stage 4 (severe): Secondary | ICD-10-CM | POA: Insufficient documentation

## 2020-10-16 LAB — POCT HEMOGLOBIN-HEMACUE
Hemoglobin: 6.9 g/dL — CL (ref 13.0–17.0)
Hemoglobin: 6.4 g/dL — CL (ref 13.0–17.0)

## 2020-10-16 MED ORDER — EPOETIN ALFA-EPBX 3000 UNIT/ML IJ SOLN: 3000.0000 [IU] | Freq: Once | INTRAMUSCULAR | Status: DC

## 2020-10-16 MED ORDER — EPOETIN ALFA-EPBX 3000 UNIT/ML IJ SOLN
INTRAMUSCULAR | Status: AC
  Administered 2020-10-16: 3000 [IU] via SUBCUTANEOUS
  Filled 2020-10-16: qty 1

## 2020-10-16 NOTE — Progress Notes (Signed)
Results for [name], [name] (MRN [id number]) as of 10/16/2020 09:25  Initial hemacue with insufficient specimen, repeated test. Retacrit 3000 units given and tolerated well, with information given to patient. Next appointment 10/30/2020 @ 0800  Ref. Range 10/16/2020 08:18  Hemoglobin Latest Ref Range: 13.0 - 17.0 g/dL 6.9 (LL)

## 2020-10-19 DIAGNOSIS — L97529 Non-pressure chronic ulcer of other part of left foot with unspecified severity: Secondary | ICD-10-CM | POA: Diagnosis not present

## 2020-10-19 DIAGNOSIS — E1142 Type 2 diabetes mellitus with diabetic polyneuropathy: Secondary | ICD-10-CM | POA: Diagnosis not present

## 2020-10-21 DIAGNOSIS — G4733 Obstructive sleep apnea (adult) (pediatric): Secondary | ICD-10-CM | POA: Diagnosis not present

## 2020-10-24 ENCOUNTER — Encounter (HOSPITAL_COMMUNITY)
Admission: RE | Admit: 2020-10-24 | Discharge: 2020-10-24 | Disposition: A | Discharge status: Home / Self Care | Payer: BC Managed Care – PPO | Source: Ambulatory Visit | Attending: Nephrology | Admitting: Nephrology

## 2020-10-24 ENCOUNTER — Other Ambulatory Visit: Payer: Self-pay

## 2020-10-24 ENCOUNTER — Encounter (HOSPITAL_COMMUNITY): Admission: RE | Admit: 2020-10-24 | Payer: BC Managed Care – PPO | Source: Ambulatory Visit

## 2020-10-24 DIAGNOSIS — D631 Anemia in chronic kidney disease: Secondary | ICD-10-CM | POA: Diagnosis not present

## 2020-10-24 DIAGNOSIS — N184 Chronic kidney disease, stage 4 (severe): Secondary | ICD-10-CM | POA: Diagnosis not present

## 2020-10-24 LAB — HEMOGLOBIN AND HEMATOCRIT, BLOOD
HCT: 20.5 % — ABNORMAL LOW (ref 39.0–52.0)
Hemoglobin: 6.1 g/dL — CL (ref 13.0–17.0)

## 2020-10-24 LAB — PREPARE RBC (CROSSMATCH)

## 2020-10-25 ENCOUNTER — Encounter (HOSPITAL_COMMUNITY): Payer: Self-pay

## 2020-10-25 ENCOUNTER — Encounter (HOSPITAL_COMMUNITY)
Admission: RE | Admit: 2020-10-25 | Discharge: 2020-10-25 | Disposition: A | Discharge status: Home / Self Care | Payer: BC Managed Care – PPO | Source: Ambulatory Visit | Attending: Nephrology | Admitting: Nephrology

## 2020-10-25 DIAGNOSIS — D631 Anemia in chronic kidney disease: Secondary | ICD-10-CM | POA: Diagnosis not present

## 2020-10-25 DIAGNOSIS — N184 Chronic kidney disease, stage 4 (severe): Secondary | ICD-10-CM | POA: Diagnosis not present

## 2020-10-25 MED ORDER — SODIUM CHLORIDE 0.9% IV SOLUTION: Freq: Once | INTRAVENOUS | Status: AC

## 2020-10-26 LAB — TYPE AND SCREEN
ABO/RH(D): O POS
Antibody Screen: NEGATIVE
Unit division: 0
Unit division: 0

## 2020-10-26 LAB — BPAM RBC
Blood Product Expiration Date: 202206212359
Blood Product Expiration Date: 202206272359
ISSUE DATE / TIME: 202205260823
ISSUE DATE / TIME: 202205260959
Unit Type and Rh: 5100
Unit Type and Rh: 5100

## 2020-10-30 ENCOUNTER — Encounter (HOSPITAL_COMMUNITY): Payer: Self-pay

## 2020-10-30 ENCOUNTER — Other Ambulatory Visit: Payer: Self-pay

## 2020-10-30 ENCOUNTER — Encounter (HOSPITAL_COMMUNITY)
Admission: RE | Admit: 2020-10-30 | Discharge: 2020-10-30 | Disposition: A | Discharge status: Home / Self Care | Payer: BC Managed Care – PPO | Source: Ambulatory Visit | Attending: Nephrology | Admitting: Nephrology

## 2020-10-30 DIAGNOSIS — N184 Chronic kidney disease, stage 4 (severe): Secondary | ICD-10-CM | POA: Diagnosis not present

## 2020-10-30 DIAGNOSIS — D631 Anemia in chronic kidney disease: Secondary | ICD-10-CM | POA: Diagnosis not present

## 2020-10-30 LAB — POCT HEMOGLOBIN-HEMACUE: Hemoglobin: 7.3 g/dL — ABNORMAL LOW (ref 13.0–17.0)

## 2020-10-30 MED ORDER — EPOETIN ALFA-EPBX 3000 UNIT/ML IJ SOLN
3000.0000 [IU] | INTRAMUSCULAR | Status: DC
  Administered 2020-10-30: 3000 [IU] via SUBCUTANEOUS
  Filled 2020-10-30: qty 1

## 2020-11-02 DIAGNOSIS — E1142 Type 2 diabetes mellitus with diabetic polyneuropathy: Secondary | ICD-10-CM | POA: Diagnosis not present

## 2020-11-13 ENCOUNTER — Encounter (HOSPITAL_COMMUNITY): Payer: Self-pay

## 2020-11-13 ENCOUNTER — Encounter (HOSPITAL_COMMUNITY)
Admission: RE | Admit: 2020-11-13 | Discharge: 2020-11-13 | Disposition: A | Discharge status: Home / Self Care | Payer: BC Managed Care – PPO | Source: Ambulatory Visit | Attending: Nephrology | Admitting: Nephrology

## 2020-11-13 ENCOUNTER — Other Ambulatory Visit: Payer: Self-pay

## 2020-11-13 DIAGNOSIS — D631 Anemia in chronic kidney disease: Secondary | ICD-10-CM | POA: Diagnosis not present

## 2020-11-13 DIAGNOSIS — E1142 Type 2 diabetes mellitus with diabetic polyneuropathy: Secondary | ICD-10-CM | POA: Diagnosis not present

## 2020-11-13 DIAGNOSIS — N184 Chronic kidney disease, stage 4 (severe): Secondary | ICD-10-CM | POA: Insufficient documentation

## 2020-11-13 LAB — HEMOGLOBIN AND HEMATOCRIT, BLOOD
HCT: 22.4 % — ABNORMAL LOW (ref 39.0–52.0)
Hemoglobin: 6.7 g/dL — CL (ref 13.0–17.0)

## 2020-11-13 LAB — PREPARE RBC (CROSSMATCH)

## 2020-11-13 LAB — POCT HEMOGLOBIN-HEMACUE: Hemoglobin: 6.6 g/dL — CL (ref 13.0–17.0)

## 2020-11-13 LAB — SAMPLE TO BLOOD BANK

## 2020-11-13 MED ORDER — EPOETIN ALFA-EPBX 3000 UNIT/ML IJ SOLN
INTRAMUSCULAR | Status: AC
  Filled 2020-11-13: qty 1

## 2020-11-13 MED ORDER — EPOETIN ALFA-EPBX 3000 UNIT/ML IJ SOLN
3000.0000 [IU] | Freq: Once | INTRAMUSCULAR | Status: AC
  Administered 2020-11-13: 3000 [IU] via SUBCUTANEOUS

## 2020-11-14 ENCOUNTER — Encounter (HOSPITAL_COMMUNITY): Payer: Self-pay

## 2020-11-14 ENCOUNTER — Encounter (HOSPITAL_COMMUNITY)
Admission: RE | Admit: 2020-11-14 | Discharge: 2020-11-14 | Disposition: A | Discharge status: Home / Self Care | Payer: BC Managed Care – PPO | Source: Ambulatory Visit | Attending: Nephrology | Admitting: Nephrology

## 2020-11-14 ENCOUNTER — Other Ambulatory Visit (HOSPITAL_COMMUNITY): Payer: Self-pay

## 2020-11-14 DIAGNOSIS — D631 Anemia in chronic kidney disease: Secondary | ICD-10-CM | POA: Diagnosis not present

## 2020-11-14 DIAGNOSIS — N184 Chronic kidney disease, stage 4 (severe): Secondary | ICD-10-CM | POA: Diagnosis not present

## 2020-11-14 DIAGNOSIS — C9000 Multiple myeloma not having achieved remission: Secondary | ICD-10-CM

## 2020-11-14 DIAGNOSIS — D649 Anemia, unspecified: Secondary | ICD-10-CM

## 2020-11-14 MED ORDER — SODIUM CHLORIDE 0.9% IV SOLUTION
Freq: Once | INTRAVENOUS | Status: DC
Start: 2020-11-14 — End: 2020-11-15

## 2020-11-14 NOTE — Progress Notes (Signed)
Notification received from Dr. Toya Smothers office requesting follow-up for decreased Hgb requiring blood transfusions despite Retacrit injections. [name] made aware. Orders received to draw CBCd, CMP, Mag, LDH, SPEP, Immunofixation Electrophoresis, Kappa/Lamda Light chains, Iron, Ferritin, B12 and folate prior to follow-up appt. Patient made aware and requests order to be faxed to [facility].

## 2020-11-15 ENCOUNTER — Ambulatory Visit: Payer: BC Managed Care – PPO | Admitting: Cardiology

## 2020-11-15 ENCOUNTER — Other Ambulatory Visit: Payer: Self-pay

## 2020-11-15 ENCOUNTER — Encounter: Payer: Self-pay | Admitting: Cardiology

## 2020-11-15 VITALS — BP 144/88 | HR 59 | Ht 74.0 in | Wt 391.4 lb

## 2020-11-15 DIAGNOSIS — I2510 Atherosclerotic heart disease of native coronary artery without angina pectoris: Secondary | ICD-10-CM

## 2020-11-15 DIAGNOSIS — I4892 Unspecified atrial flutter: Secondary | ICD-10-CM

## 2020-11-15 DIAGNOSIS — I5022 Chronic systolic (congestive) heart failure: Secondary | ICD-10-CM | POA: Diagnosis not present

## 2020-11-15 LAB — BPAM RBC
Blood Product Expiration Date: 202207162359
Blood Product Expiration Date: 202207182359
ISSUE DATE / TIME: 202206150802
ISSUE DATE / TIME: 202206150950
Unit Type and Rh: 5100
Unit Type and Rh: 5100

## 2020-11-15 LAB — TYPE AND SCREEN
ABO/RH(D): O POS
Antibody Screen: NEGATIVE
Unit division: 0
Unit division: 0

## 2020-11-15 MED ORDER — HYDRALAZINE HCL 100 MG PO TABS: 100.0000 mg | ORAL_TABLET | Freq: Three times a day (TID) | ORAL | 3 refills | Status: DC

## 2020-11-15 NOTE — Progress Notes (Signed)
Clinical Summary Mr. Doswell is a 60 y.o.male seen today for follow up of the following medical problems.    1. CAD - prior stents as reported below -denies recent symptoms   2. Chronic systolic HF - 2518 echo LVEF 35-40% - 2015 LVEF 25-30% 2017 LVEF 35-40%   - 07/2019 ehco LVEF 25-30%, grade II diastolic dysfxn. -Limited ACE inhibitor's or angiotensin receptor blockers due to advanced chronic kidney disease - off beta blocker due to bradycardia from prior Dr Raliegh Ip notes.      - weight 385 12/2019, up to 399 at 06/2020 appt, today 404 lbs - last visit we started hydral/nitrates - at Jan 27 appt with neprhology had increased swelling, torsemide increased to 67m alternating with 233m Sensitive to diuretics given intermittent diarrhea - since change swelling is stable. Home weights 399 lbs last by home - mild swelling in legs, can feel abdominal distension and some SOB. Chronically sleeps at angle - chronic diarrhea that can exacebate at times.    - home weights normal range typically 386 to 392 lbs - taking torsemide 4021mlternating with 26m98m home bp's usually 130s/70s-80s      - last visit we increased hydralazine to 75mg11m -tolerating without side effects - no recent symptoms    3. Aflutter CrCl is 76 based on calculations, has been on xarelto 20 - occasionally can hear his heart beat, no specific palpitations   4. CKD IV - followed by Dr BhutaTheador Hawthorne. OSA - compliant with cpap    6. Multiple myeloma - being monitored      7. HTN - compliant with meds    8. 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 [employer] Past Medical History:  Diagnosis Date   Abscess 04/2017   Scrotal   Anemia    Arteriosclerotic cardiovascular disease (ASCVD)    MI-2000s; stent to the proximal LAD and diagonal in 2001;  stress nuclear in 2008-impaired exercise capacity, left ventricular dilatation, moderately to severely depressed EF, apical, inferior and anteroseptal scar   Arthritis    Atrial flutter (HCC)    Bence-Jones proteinuria 05/05/2011   Cellulitis of leg    both legs   Chronic diarrhea    Chronic kidney disease, stage 3, mod decreased GFR (HCC)    Creatinine of 1.84 in 06/2011 and 1.5 in 07/2011   Diabetes mellitus    Insulin   Dysrhythmia    AFlutter   GERD (gastroesophageal reflux disease)    Gout    Hyperlipidemia    Hypertension    Injection site reaction    Multiple myeloma 07/01/2011   Myocardial infarction (HCC) [place]   Obesity    Pedal edema    Venous insufficiency   Sleep apnea    uses cpap   Ulcer      Allergies  Allergen Reactions   Beef-Derived Products Diarrhea   Pork-Derived Products Diarrhea   Other     Band aids      Current Outpatient Medications  Medication Sig Dispense Refill   acetaminophen (TYLENOL) 500 MG tablet Take 500-1,000 mg by mouth every 6 (six) hours as needed for mild pain or moderate pain.     acyclovir (ZOVIRAX) 400 MG tablet Take 1 tablet (400 mg total) by mouth every morning. 30 tablet 5   allopurinol (  ZYLOPRIM) 300 MG tablet TAKE ONE TABLET BY MOUTH ONCE DAILY. 30 tablet 2   Artificial Tear Ointment (DRY EYES OP) Place 1 drop into both eyes as needed (dry eyes).     atorvastatin (LIPITOR) 80 MG tablet TAKE ONE TABLET BY MOUTH AT BEDTIME. 90 tablet 3   Cholecalciferol (D2000 ULTRA STRENGTH) 50 MCG (2000 UT) CAPS Take by mouth.     CONTOUR NEXT TEST test strip      cyclobenzaprine (FLEXERIL) 5 MG tablet Take by mouth.     dicyclomine (BENTYL) 20 MG tablet TAKE 1 TABLET BY MOUTH BEFORE MEALS 3 TIMES DAILY. 90 tablet 2   Eluxadoline 100 MG TABS Take 100 mg 2 (two) times daily with a meal by mouth.      ferrous sulfate 325 (65 FE) MG EC tablet Take 45 mg by mouth. Every other day     gabapentin (NEURONTIN) 600 MG tablet TAKE 1 TABLET BY MOUTH  ONCE A DAY. (Patient taking differently: Take 600 mg by mouth 2 (two) times daily. Pt takes 1 tablet in a.m. and 1/2 tablet at bedtime) 30 tablet 0   hydrALAZINE (APRESOLINE) 50 MG tablet Take 1.5 tablets (75 mg total) by mouth 3 (three) times daily. 405 tablet 3   insulin aspart (NOVOLOG) 100 UNIT/ML injection Inject into the skin. Sliding scale over 150= 1 unit, 200= 2 units,250= 3 units     insulin detemir (LEVEMIR) 100 UNIT/ML injection Inject 0.1 mLs (10 Units total) into the skin every morning. 10 mL 11   INSULIN SYRINGE .5CC/29G 29G X 1/2" 0.5 ML MISC      isosorbide mononitrate (IMDUR) 30 MG 24 hr tablet Take 0.5 tablets (15 mg total) by mouth daily. 45 tablet 3   lisinopril (ZESTRIL) 5 MG tablet Take 5 mg by mouth daily.     loratadine (CLARITIN) 10 MG tablet Take 10 mg by mouth every morning.     Magnesium Cl-Calcium Carbonate (SLOW-MAG PO) Take 1 tablet by mouth every morning.     Microlet Lancets MISC      Multiple Vitamins-Minerals (MULTIVITAMINS THER. W/MINERALS) TABS Take 1 tablet by mouth daily.     nitroGLYCERIN (NITROSTAT) 0.4 MG SL tablet DISSOLVE 1 TABLET UNDER TONGUE EVERY 5 MINUTES UP TO 15 MIN FOR CHESTPAIN. IF NO RELIEF CALL 911. 25 tablet 0   pantoprazole (PROTONIX) 40 MG tablet Take 40 mg by mouth daily.     potassium chloride SA (KLOR-CON) 20 MEQ tablet Take 20 mEq by mouth daily. Every other day     sodium bicarbonate 650 MG tablet Take 650 mg by mouth 3 (three) times daily.     SURE COMFORT PEN NEEDLES 29G X 12.7MM MISC USE AS DIRECTED FOURCTIMES DAILY.     torsemide (DEMADEX) 20 MG tablet Take 2 tablets (40 mg total) by mouth daily. (Patient taking differently: Take 40 mg by mouth daily. 40/20 alternating) 180 tablet 3   XARELTO 20 MG TABS tablet TAKE 1 TABLET BY MOUTH DAILY. 30 tablet 0   No current facility-administered medications for this visit.   Facility-Administered Medications Ordered in Other Visits  Medication Dose Route Frequency Provider Last Rate Last  Admin   sodium chloride 0.9 % injection 10 mL  10 mL Intravenous Once Farrel Gobble, MD         Past Surgical History:  Procedure Laterality Date   ABSCESS DRAINAGE     Scrotal   BIOPSY  01/02/2012   Procedure: BIOPSY;  Surgeon: Rogene Houston, MD;  [facility];  Service: Endoscopy;  Laterality: N/A;   BONE MARROW BIOPSY  05/13/11   CARDIAC CATHETERIZATION     cardiac stent   CARDIOVERSION N/A 10/13/2012   Procedure: CARDIOVERSION;  Surgeon: Yehuda Savannah, MD;  Location: [facility];  Service: Cardiovascular;  Laterality: N/A;   CATARACT EXTRACTION W/PHACO Left 02/13/2014   Procedure: CATARACT EXTRACTION PHACO AND INTRAOCULAR LENS PLACEMENT ([place]);  Surgeon: Tonny , MD;  Location: [facility];  Service: Ophthalmology;  Laterality: Left;  CDE:  7.67   CATARACT EXTRACTION W/PHACO Right 03/02/2014   Procedure: CATARACT EXTRACTION PHACO AND INTRAOCULAR LENS PLACEMENT RIGHT EYE CDE=16.81;  Surgeon: Tonny , MD;  Location: [facility];  Service: Ophthalmology;  Laterality: Right;   COLONOSCOPY  11/28/2011   Procedure: COLONOSCOPY;  Surgeon: Rogene Houston, MD;  [facility];  Service: Endoscopy;  Laterality: N/A;  [place] Right 12/16/2017   Procedure: CYSTOSCOPY WITH RIGHT RETROGRADE PYELOGRAM AND RIGHT URETERAL STENT REMOVAL;  Surgeon: Cleon Gustin, MD;  [facility]: [facility];  Service: Urology;  Laterality: Right;   CYSTOSCOPY W/ URETERAL STENT PLACEMENT Right 07/31/2017   Procedure: CYSTOSCOPY WITH RETROGRADE PYELOGRAM/URETERAL STENT PLACEMENT;  Surgeon: Cleon Gustin, MD;  [facility];  Service: Urology;  Laterality: Right;   ESOPHAGOGASTRODUODENOSCOPY  01/02/2012   Procedure: ESOPHAGOGASTRODUODENOSCOPY (EGD);  Surgeon: Rogene Houston, MD;  [facility];  Service: Endoscopy;  Laterality: N/A;  100   ESOPHAGOGASTRODUODENOSCOPY N/A 09/20/2012   Procedure: ESOPHAGOGASTRODUODENOSCOPY (EGD);  Surgeon: Rogene Houston, MD;  [facility];  Service: Endoscopy;  Laterality: N/A;   EUS N/A 10/07/2012   Procedure: UPPER ENDOSCOPIC ULTRASOUND (EUS) LINEAR;  Surgeon: Milus Banister, MD;  [facility];  Service: Endoscopy;  Laterality: N/A;   INCISION AND DRAINAGE ABSCESS N/A 04/14/2017   Procedure: INCISION AND DRAINAGE ABSCESS;  Surgeon: Ceasar Mons, MD;  [facility];  Service: Urology;  Laterality: N/A;   INCISION AND DRAINAGE ABSCESS ANAL     IRRIGATION AND DEBRIDEMENT ABSCESS N/A 04/15/2017   Procedure: DEBRIDEMENT SCROTAL WOUND AND DRESSING CHANGE;  Surgeon: Ceasar Mons, MD;  [facility];  Service: Urology;  Laterality: N/A;   IRRIGATION AND DEBRIDEMENT ABSCESS N/A 04/17/2017   Procedure: IRRIGATION AND DEBRIDEMENT ABSCESS;  Surgeon: Ceasar Mons, MD;  [facility];  Service: Urology;  Laterality: N/A;  RM 3   LAPAROSCOPIC GASTRIC BANDING  2006   has been removed   OSTECTOMY Right 04/08/2017   Procedure: OSTECTOMY RIGHT GREAT TOE;  Surgeon: Caprice Beaver, DPM;  [facility];  Service: Podiatry;  Laterality: Right;   PORT-A-CATH REMOVAL Left 12/07/2012   Procedure: REMOVAL PORT-A-CATH;  Surgeon: Scherry Ran, MD;  Location[facility];  Service: General;  Laterality: Left;   PORT-A-CATH REMOVAL N/A 10/02/2017   Procedure: MINOR REMOVAL PORT-A-CATH AT BEDSIDE;  Surgeon: Donnie Mesa, MD;  Location: [facility];  Service: General;  Laterality: N/A;   PORTACATH PLACEMENT  07/07/2011   Procedure: INSERTION PORT-A-CATH;  Surgeon: Scherry Ran, MD;  [facility];  Service: General;  Laterality: N/A;   PORTACATH PLACEMENT N/A 12/07/2012   Procedure: INSERTION PORT-A-CATH;  Surgeon: Scherry Ran, MD;  [facility];  Service: General;  Laterality: N/A;  Attempted portacath placement on left and right side   URETEROSCOPY Right 12/16/2017   Procedure: DIAGNOSTIC RIGHT URETEROSCOPY;  Surgeon: Cleon Gustin, MD;  [facility];  Service:  Urology;  Laterality: Right;  WOUND DEBRIDEMENT Right 04/08/2017   Procedure: EXCISION ULCERATION RIGHT GREAT TOE;  Surgeon: Caprice Beaver, DPM;  [facility];  Service: Podiatry;  Laterality: Right;   WRIST SURGERY     Left; removal of bone fragment     Allergies  Allergen Reactions   Beef-Derived Products Diarrhea   Pork-Derived Products Diarrhea   Other     Band aids       Family History  Problem Relation Age of Onset   Heart disease Mother    Cancer Mother    Diabetes Father    Arthritis Other    Anesthesia problems Neg Hx    Hypotension Neg Hx    Malignant hyperthermia Neg Hx    Pseudochol deficiency Neg Hx      Social History Mr. Basquez reports that he quit smoking about 19 years ago. His smoking use included cigarettes and cigars. He has a 0.25 pack-year smoking history. He has never used smokeless tobacco. Mr. Meador reports no history of alcohol use.   Review of Systems CONSTITUTIONAL: No weight loss, fever, chills, weakness or fatigue.  HEENT: Eyes: No visual loss, blurred vision, double vision or yellow sclerae.No hearing loss, sneezing, congestion, runny nose or sore throat.  SKIN: No rash or itching.  CARDIOVASCULAR: per hpi RESPIRATORY: No shortness of breath, cough or sputum.  GASTROINTESTINAL: No anorexia, nausea, vomiting or diarrhea. No abdominal pain or blood.  GENITOURINARY: No burning on urination, no polyuria NEUROLOGICAL: No headache, dizziness, syncope, paralysis, ataxia, numbness or tingling in the extremities. No change in bowel or bladder control.  MUSCULOSKELETAL: No muscle, back pain, joint pain or stiffness.  LYMPHATICS: No enlarged nodes. No history of splenectomy.  PSYCHIATRIC: No history of depression or anxiety.  ENDOCRINOLOGIC: No reports of sweating, cold or heat intolerance. No polyuria or polydipsia.  Marland Kitchen   Physical Examination Today's Vitals   11/15/20 0806  BP: (!) 144/88  Pulse: (!) 59  SpO2: 99%  Weight:  (!) 391 lb 6.4 oz (177.5 kg)  Height: '6\' 2"'  (1.88 m)   Body mass index is 50.25 kg/m.  Gen: resting comfortably, no acute distress HEENT: no scleral icterus, pupils equal round and reactive, no palptable cervical adenopathy,  CV: RRR, no m/r/g, no jvd Resp: Clear to auscultation bilaterally GI: abdomen is soft, non-tender, non-distended, normal bowel sounds, no hepatosplenomegaly MSK: extremities are warm, no edema.  Skin: warm, no rash Neuro:  no focal deficits Psych: appropriate affect   Diagnostic Studies     Assessment and Plan  1. Chronic systolic HF - several year history of chronic systolic HF, ICM - medical therapy limited. Bradycardia on beta blockers now off. Significant renal dysfunction, on just low dose ACEI per renal. Not a candidate for farxiga given GFR - diuretics have been difficult, intermittent chronic diarrhea with fluid losses that has led to AKI in the past on diuretics. Currently on torsemide 51m alt days with 252m  - increase hydralazine to 1006mid, may titrate imdur further at next f/u - repeat echo, may need ICD consideration     2. Aflutter - not on av nodal agents due to prior bradycardia - given degree of recent anemia will hold xarelto for now. He denies any signs of active bleeding, ongoing anemia workup per renal and heme/onc, he does have advanced kidney disease on epogen and a history of multiple myeloma.    3. CAD - no symptoms, continue current meds           JonAlphonse Guild  Harl Bowie, M.D.

## 2020-11-15 NOTE — Patient Instructions (Signed)
Medication Instructions:  INCREASE hydralazine to 100 mg three times a day    HOLD Xarelto     *If you need a refill on your cardiac medications before your next appointment, please call your pharmacy*   Lab Work: None today  If you have labs (blood work) drawn today and your tests are completely normal, you will receive your results only by: [place] (if you have MyChart) OR A paper copy in the mail If you have any lab test that is abnormal or we need to change your treatment, we will call you to review the results.   Testing/Procedures: Your physician has requested that you have an echocardiogram. Echocardiography is a painless test that uses sound waves to create images of your heart. It provides your doctor with information about the size and shape of your heart and how well your heart's chambers and valves are working. This procedure takes approximately one hour. There are no restrictions for this procedure.    Follow-Up: At [facility], you and your health needs are our priority.  As part of our continuing mission to provide you with exceptional heart care, we have created designated Provider Care Teams.  These Care Teams include your primary Cardiologist (physician) and Advanced Practice Providers (APPs -  Physician Assistants and Nurse Practitioners) who all work together to provide you with the care you need, when you need it.  We recommend signing up for the patient portal called "MyChart".  Sign up information is provided on this After Visit Summary.  MyChart is used to connect with patients for Virtual Visits (Telemedicine).  Patients are able to view lab/test results, encounter notes, upcoming appointments, etc.  Non-urgent messages can be sent to your provider as well.   To learn more about what you can do with MyChart, go to [email or web address].    Your next appointment:   1 month(s)  The format for your next appointment:   In Person  Provider:    Carlyle Dolly, MD   Other Instructions None        Thank you for choosing [facility] !

## 2020-11-21 ENCOUNTER — Other Ambulatory Visit (HOSPITAL_COMMUNITY): Payer: Self-pay

## 2020-11-21 DIAGNOSIS — C9000 Multiple myeloma not having achieved remission: Secondary | ICD-10-CM

## 2020-11-21 DIAGNOSIS — G4733 Obstructive sleep apnea (adult) (pediatric): Secondary | ICD-10-CM | POA: Diagnosis not present

## 2020-11-21 DIAGNOSIS — D649 Anemia, unspecified: Secondary | ICD-10-CM

## 2020-11-26 ENCOUNTER — Other Ambulatory Visit (HOSPITAL_COMMUNITY): Payer: BC Managed Care – PPO

## 2020-11-26 ENCOUNTER — Ambulatory Visit (HOSPITAL_COMMUNITY): Payer: BC Managed Care – PPO | Admitting: Hematology

## 2020-11-27 ENCOUNTER — Encounter (HOSPITAL_COMMUNITY)
Admission: RE | Admit: 2020-11-27 | Discharge: 2020-11-27 | Disposition: A | Discharge status: Home / Self Care | Payer: BC Managed Care – PPO | Source: Ambulatory Visit | Attending: Nephrology | Admitting: Nephrology

## 2020-11-27 ENCOUNTER — Other Ambulatory Visit: Payer: Self-pay

## 2020-11-27 DIAGNOSIS — D631 Anemia in chronic kidney disease: Secondary | ICD-10-CM | POA: Diagnosis not present

## 2020-11-27 DIAGNOSIS — E1142 Type 2 diabetes mellitus with diabetic polyneuropathy: Secondary | ICD-10-CM | POA: Diagnosis not present

## 2020-11-27 DIAGNOSIS — N184 Chronic kidney disease, stage 4 (severe): Secondary | ICD-10-CM | POA: Diagnosis not present

## 2020-11-27 LAB — POCT HEMOGLOBIN-HEMACUE: Hemoglobin: 7.8 g/dL — ABNORMAL LOW (ref 13.0–17.0)

## 2020-11-27 MED ORDER — EPOETIN ALFA-EPBX 3000 UNIT/ML IJ SOLN
3000.0000 [IU] | Freq: Once | INTRAMUSCULAR | Status: AC
Start: 2020-11-27 — End: 2020-11-27
  Administered 2020-11-27: 3000 [IU] via SUBCUTANEOUS
  Filled 2020-11-27: qty 1

## 2020-11-28 ENCOUNTER — Other Ambulatory Visit: Payer: Self-pay

## 2020-11-28 ENCOUNTER — Ambulatory Visit (HOSPITAL_COMMUNITY)
Admission: RE | Admit: 2020-11-28 | Discharge: 2020-11-28 | Disposition: A | Discharge status: Home / Self Care | Payer: BC Managed Care – PPO | Source: Ambulatory Visit | Attending: Cardiology | Admitting: Cardiology

## 2020-11-28 DIAGNOSIS — I5022 Chronic systolic (congestive) heart failure: Secondary | ICD-10-CM | POA: Insufficient documentation

## 2020-11-28 LAB — ECHOCARDIOGRAM COMPLETE
Area-P 1/2: 4.6 cm2
S' Lateral: 5.2 cm

## 2020-11-28 MED ORDER — PERFLUTREN LIPID MICROSPHERE
1.0000 mL | INTRAVENOUS | Status: AC | PRN
  Administered 2020-11-28: 3 mL via INTRAVENOUS
  Filled 2020-11-28: qty 10

## 2020-11-28 NOTE — Progress Notes (Signed)
[place], [place] [address]   CLINIC:  Medical Oncology/Hematology  PCP:  [name], [address] / [place] [address]  [phone]  REASON FOR VISIT:   Follow-up for multiple myeloma  CURRENT THERAPY: Observation  INTERVAL HISTORY:  [name], a 60 y.o. male, returns for routine follow-up for his multiple myeloma.   He is doing well, complains of increasing fatigue, SOB and palpitations may be because of increasing hemoglobin. He is taking epo shot 3000 units every other week for anemia from CKD No other complaints for me today Baseline neuropathy from DM Cardiology team stopped his xarelto because of a fib apparently. He is here with his wife. Rest of the pertinent 10 point ROS reviewed and neg.  PAST MEDICAL/SURGICAL HISTORY:  Past Medical History:  Diagnosis Date   Abscess 04/2017   Scrotal   Anemia    Arteriosclerotic cardiovascular disease (ASCVD)    MI-2000s; stent to the proximal LAD and diagonal in 2001; stress nuclear in 2008-impaired exercise capacity, left ventricular dilatation, moderately to severely depressed EF, apical, inferior and anteroseptal scar   Arthritis    Atrial flutter (HCC)    Bence-Jones proteinuria 05/05/2011   Cellulitis of leg    both legs   Chronic diarrhea    Chronic kidney disease, stage 3, mod decreased GFR (HCC)    Creatinine of 1.84 in 06/2011 and 1.5 in 07/2011   Diabetes mellitus    Insulin   Dysrhythmia    AFlutter   GERD (gastroesophageal reflux disease)    Gout    Hyperlipidemia    Hypertension    Injection site reaction    Multiple myeloma 07/01/2011   Myocardial infarction ([place]) 2000   Obesity    Pedal edema    Venous insufficiency   Sleep apnea    uses cpap   Ulcer    Past Surgical History:  Procedure Laterality Date   ABSCESS DRAINAGE     Scrotal   BIOPSY  01/02/2012   Procedure: BIOPSY;  Surgeon: Rogene Houston, MD;  [facility];   Service: Endoscopy;  Laterality: N/A;   BONE MARROW BIOPSY  05/13/11   CARDIAC CATHETERIZATION     cardiac stent   CARDIOVERSION N/A 10/13/2012   Procedure: CARDIOVERSION;  Surgeon: Yehuda Savannah, MD;  Location: [facility];  Service: Cardiovascular;  Laterality: N/A;   CATARACT EXTRACTION W/PHACO Left 02/13/2014   Procedure: CATARACT EXTRACTION PHACO AND INTRAOCULAR LENS PLACEMENT ([place]);  Surgeon: Tonny Branch, MD;  Location: [facility];  Service: Ophthalmology;  Laterality: Left;  CDE:  7.67   CATARACT EXTRACTION W/PHACO Right 03/02/2014   Procedure: CATARACT EXTRACTION PHACO AND INTRAOCULAR LENS PLACEMENT RIGHT EYE CDE=16.81;  Surgeon: Tonny Branch, MD;  Location: [facility];  Service: Ophthalmology;  Laterality: Right;   COLONOSCOPY  11/28/2011   Procedure: COLONOSCOPY;  Surgeon: Rogene Houston, MD;  [facility];  Service: Endoscopy;  Laterality: N/A;  [place] Right 12/16/2017   Procedure: CYSTOSCOPY WITH RIGHT RETROGRADE PYELOGRAM AND RIGHT URETERAL STENT REMOVAL;  Surgeon: Cleon Gustin, MD;  Location: [facility];  Service: Urology;  Laterality: Right;   CYSTOSCOPY W/ URETERAL STENT PLACEMENT Right 07/31/2017   Procedure: CYSTOSCOPY WITH RETROGRADE PYELOGRAM/URETERAL STENT PLACEMENT;  Surgeon: Cleon Gustin, MD;  [facility];  Service: Urology;  Laterality: Right;   ESOPHAGOGASTRODUODENOSCOPY  01/02/2012   Procedure: ESOPHAGOGASTRODUODENOSCOPY (EGD);  Surgeon: Mechele Dawley  Laural Golden, MD;  [facility];  Service: Endoscopy;  Laterality: N/A;  100   ESOPHAGOGASTRODUODENOSCOPY N/A 09/20/2012   Procedure: ESOPHAGOGASTRODUODENOSCOPY (EGD);  Surgeon: Rogene Houston, MD;  [facility];  Service: Endoscopy;  Laterality: N/A;   EUS N/A 10/07/2012   Procedure: UPPER ENDOSCOPIC ULTRASOUND (EUS) LINEAR;  Surgeon: Milus Banister, MD;  [facility];  Service: Endoscopy;  Laterality: N/A;   INCISION AND DRAINAGE ABSCESS N/A 04/14/2017   Procedure:  INCISION AND DRAINAGE ABSCESS;  Surgeon: Ceasar Mons, MD;  [facility];  Service: Urology;  Laterality: N/A;   INCISION AND DRAINAGE ABSCESS ANAL     IRRIGATION AND DEBRIDEMENT ABSCESS N/A 04/15/2017   Procedure: DEBRIDEMENT SCROTAL WOUND AND DRESSING CHANGE;  Surgeon: Ceasar Mons, MD;  [facility];  Service: Urology;  Laterality: N/A;   IRRIGATION AND DEBRIDEMENT ABSCESS N/A 04/17/2017   Procedure: IRRIGATION AND DEBRIDEMENT ABSCESS;  Surgeon: Ceasar Mons, MD;  [facility];  Service: Urology;  Laterality: N/A;  RM 3   LAPAROSCOPIC GASTRIC BANDING  2006   has been removed   OSTECTOMY Right 04/08/2017   Procedure: OSTECTOMY RIGHT GREAT TOE;  Surgeon: Caprice Beaver, DPM;  [facility];  Service: Podiatry;  Laterality: Right;   PORT-A-CATH REMOVAL Left 12/07/2012   Procedure: REMOVAL PORT-A-CATH;  Surgeon: Scherry Ran, MD;  [facility];  Service: General;  Laterality: Left;   PORT-A-CATH REMOVAL N/A 10/02/2017   Procedure: MINOR REMOVAL PORT-A-CATH AT BEDSIDE;  Surgeon: Donnie Mesa, MD;  Location: [facility];  Service: General;  Laterality: N/A;   PORTACATH PLACEMENT  07/07/2011   Procedure: INSERTION PORT-A-CATH;  Surgeon: Scherry Ran, MD;  [facility];  Service: General;  Laterality: N/A;   PORTACATH PLACEMENT N/A 12/07/2012   Procedure: INSERTION PORT-A-CATH;  Surgeon: Scherry Ran, MD;  Location[facility];  Service: General;  Laterality: N/A;  Attempted portacath placement on left and right side   URETEROSCOPY Right 12/16/2017   Procedure: DIAGNOSTIC RIGHT URETEROSCOPY;  Surgeon: Cleon Gustin, MD;  [facility];  Service: Urology;  Laterality: Right;   WOUND DEBRIDEMENT Right 04/08/2017   Procedure: EXCISION ULCERATION RIGHT GREAT TOE;  Surgeon: Caprice Beaver, DPM;  Location[facility];  Service: Podiatry;  Laterality: Right;   WRIST SURGERY     Left; removal of bone fragment    SOCIAL HISTORY:   Social History   Socioeconomic History   Marital status: Married    Spouse name: Not on file   Number of children: Not on file   Years of education: 16   Highest education level: Not on file  Occupational History   Occupation: Nurse, learning disability   Occupation: Optometrist: [employer]  Tobacco Use   Smoking status: Former    Packs/day: 0.25    Years: 1.00    Pack years: 0.25    Types: Cigarettes, Cigars    Quit date: 05/17/2001    Years since quitting: 19.5   Smokeless tobacco: Never  Vaping Use   Vaping Use: Never used  Substance and Sexual Activity   Alcohol use: No    Alcohol/week: 0.0 standard drinks   Drug use: No   Sexual activity: Yes    Birth control/protection: None  Other Topics Concern   Not on file  Social History Narrative   Not on file   Social Determinants of Health   Financial Resource Strain: Not on file  Food Insecurity: Not  on file  Transportation Needs: Not on file  Physical Activity: Not on file  Stress: Not on file  Social Connections: Not on file  Intimate Partner Violence: Not on file    FAMILY HISTORY:  Family History  Problem Relation Age of Onset   Heart disease Mother    Cancer Mother    Diabetes Father    Arthritis Other    Anesthesia problems Neg Hx    Hypotension Neg Hx    Malignant hyperthermia Neg Hx    Pseudochol deficiency Neg Hx     CURRENT MEDICATIONS:  Current Outpatient Medications  Medication Sig Dispense Refill   acetaminophen (TYLENOL) 500 MG tablet Take 500-1,000 mg by mouth every 6 (six) hours as needed for mild pain or moderate pain.     acyclovir (ZOVIRAX) 400 MG tablet Take 1 tablet (400 mg total) by mouth every morning. 30 tablet 5   allopurinol (ZYLOPRIM) 300 MG tablet TAKE ONE TABLET BY MOUTH ONCE DAILY. 30 tablet 2   Artificial Tear Ointment (DRY EYES OP) Place 1 drop into both eyes as needed (dry eyes).     atorvastatin (LIPITOR) 80 MG tablet TAKE ONE TABLET BY MOUTH AT  BEDTIME. 90 tablet 3   Cholecalciferol (D2000 ULTRA STRENGTH) 50 MCG (2000 UT) CAPS Take by mouth.     CONTOUR NEXT TEST test strip      cyclobenzaprine (FLEXERIL) 5 MG tablet Take by mouth.     dicyclomine (BENTYL) 20 MG tablet TAKE 1 TABLET BY MOUTH BEFORE MEALS 3 TIMES DAILY. 90 tablet 2   Eluxadoline 100 MG TABS Take 100 mg 2 (two) times daily with a meal by mouth.      ferrous sulfate 325 (65 FE) MG EC tablet Take 45 mg by mouth. Every other day     gabapentin (NEURONTIN) 600 MG tablet TAKE 1 TABLET BY MOUTH ONCE A DAY. 30 tablet 0   hydrALAZINE (APRESOLINE) 100 MG tablet Take 1 tablet (100 mg total) by mouth 3 (three) times daily. 270 tablet 3   insulin aspart (NOVOLOG) 100 UNIT/ML injection Inject into the skin. Sliding scale over 150= 1 unit, 200= 2 units,250= 3 units     insulin detemir (LEVEMIR) 100 UNIT/ML injection Inject 0.1 mLs (10 Units total) into the skin every morning. 10 mL 11   INSULIN SYRINGE .5CC/29G 29G X 1/2" 0.5 ML MISC      isosorbide mononitrate (IMDUR) 30 MG 24 hr tablet Take 0.5 tablets (15 mg total) by mouth daily. 45 tablet 3   lisinopril (ZESTRIL) 5 MG tablet Take 5 mg by mouth daily.     loratadine (CLARITIN) 10 MG tablet Take 10 mg by mouth every morning.     Magnesium Cl-Calcium Carbonate (SLOW-MAG PO) Take 1 tablet by mouth every morning.     Microlet Lancets MISC      Multiple Vitamins-Minerals (MULTIVITAMINS THER. W/MINERALS) TABS Take 1 tablet by mouth daily.     nitroGLYCERIN (NITROSTAT) 0.4 MG SL tablet DISSOLVE 1 TABLET UNDER TONGUE EVERY 5 MINUTES UP TO 15 MIN FOR CHESTPAIN. IF NO RELIEF CALL 911. 25 tablet 0   pantoprazole (PROTONIX) 40 MG tablet Take 40 mg by mouth daily.     potassium chloride SA (KLOR-CON) 20 MEQ tablet Take 20 mEq by mouth daily. Every other day     sodium bicarbonate 650 MG tablet Take 650 mg by mouth 3 (three) times daily.     SURE COMFORT PEN NEEDLES 29G X 12.7MM MISC USE AS DIRECTED FOURCTIMES DAILY.  torsemide (DEMADEX)  20 MG tablet Take 40 mg by mouth every other day alternating with 20 mg     No current facility-administered medications for this visit.   Facility-Administered Medications Ordered in Other Visits  Medication Dose Route Frequency Provider Last Rate Last Admin   sodium chloride 0.9 % injection 10 mL  10 mL Intravenous Once Farrel Gobble, MD        ALLERGIES:  Allergies  Allergen Reactions   Beef-Derived Products Diarrhea   Pork-Derived Products Diarrhea   Other     Band aids     PHYSICAL EXAM:  Performance status (ECOG): 1 - Symptomatic but completely ambulatory  There were no vitals filed for this visit.  Wt Readings from Last 3 Encounters:  11/15/20 (!) 391 lb 6.4 oz (177.5 kg)  11/14/20 (!) 395 lb 15.1 oz (179.6 kg)  10/30/20 (!) 395 lb 15.1 oz (179.6 kg)   Physical Exam Vitals reviewed.  Constitutional:      Appearance: Normal appearance. He is obese.  HENT:     Mouth/Throat:     Mouth: Mucous membranes are moist.  Eyes:     Pupils: Pupils are equal, round, and reactive to light.  Cardiovascular:     Rate and Rhythm: Normal rate and regular rhythm.     Pulses: Normal pulses.     Heart sounds: Normal heart sounds.  Pulmonary:     Effort: Pulmonary effort is normal.     Breath sounds: Normal breath sounds.  Abdominal:     Palpations: Abdomen is soft. There is no mass.     Tenderness: There is no abdominal tenderness.  Musculoskeletal:        General: Swelling (compression socks on) present.     Right lower leg: No edema.     Left lower leg: No edema.  Neurological:     Mental Status: He is alert and oriented to person, place, and time.  Psychiatric:        Mood and Affect: Mood normal.        Behavior: Behavior normal.    LABORATORY DATA:  I have reviewed the labs as listed.  CBC Latest Ref Rng & Units 11/27/2020 11/13/2020 11/13/2020  WBC 4.0 - 10.5 K/uL - - -  Hemoglobin 13.0 - 17.0 g/dL 7.8(L) 6.7(LL) 6.6(LL)  Hematocrit 39.0 - 52.0 % - 22.4(L) -   Platelets 150 - 400 K/uL - - -   CMP Latest Ref Rng & Units 12/13/2019 06/10/2019 02/01/2019  Glucose 70 - 99 mg/dL 131(H) 157(H) 144(H)  BUN 6 - 20 mg/dL 75(H) 40(H) 55(H)  Creatinine 0.61 - 1.24 mg/dL 2.78(H) 2.26(H) 2.38(H)  Sodium 135 - 145 mmol/L 140 142 139  Potassium 3.5 - 5.1 mmol/L 4.6 4.4 3.8  Chloride 98 - 111 mmol/L 107 113(H) 112(H)  CO2 22 - 32 mmol/L 22 23 19(L)  Calcium 8.9 - 10.3 mg/dL 8.8(L) 8.3(L) 8.5(L)  Total Protein 6.5 - 8.1 g/dL 7.3 6.5 6.7  Total Bilirubin 0.3 - 1.2 mg/dL 0.5 0.6 0.9  Alkaline Phos 38 - 126 U/L 115 101 106  AST 15 - 41 U/L 31 27 41  ALT 0 - 44 U/L 33 27 39      Component Value Date/Time   RBC 3.75 (L) 12/13/2019 1434   MCV 99.7 12/13/2019 1434   MCH 30.9 12/13/2019 1434   MCHC 31.0 12/13/2019 1434   RDW 14.6 12/13/2019 1434   LYMPHSABS 2.3 12/13/2019 1434   MONOABS 0.6 12/13/2019 1434   EOSABS 0.2  12/13/2019 1434   BASOSABS 0.0 12/13/2019 1434    DIAGNOSTIC IMAGING:  I have independently reviewed the scans and discussed with the patient. ECHOCARDIOGRAM COMPLETE  Result Date: 11/28/2020    ECHOCARDIOGRAM REPORT   Patient Name:   XAVIER MUNGER [facility] Date of Exam: 11/28/2020 Medical Rec #:  [id number]         Height:       74.0 in Accession #:    [id number]        Weight:       391.4 lb Date of Birth:  05/24/1961         BSA:          2.891 m Patient Age:    27 years          BP:           133/71 mmHg Patient Gender: M                 HR:           65 bpm. Exam Location:  [name] Procedure: 2D Echo, Cardiac Doppler and Color Doppler Indications:    I50.22 (ICD-10-CM) - Chronic systolic heart failure  History:        Patient has prior history of Echocardiogram examinations, most                 recent 07/26/2019. CHF, Arrythmias:Atrial Flutter; Risk                 Factors:Hypertension, Diabetes, Dyslipidemia and Former Smoker.                 Arteriosclerotic cardiovascular disease, Morbid Obesity.  Sonographer:    Alvino Chapel RCS Referring Phys:  [id number] Alphonse Guild BRANCH  Sonographer Comments: Image acquisition challenging due to patient body habitus. IMPRESSIONS  1. Left ventricular ejection fraction, by estimation, is approximately 30 to 35%. The left ventricle has moderately decreased function. The left ventricle demonstrates regional wall motion abnormalities (see scoring diagram/findings for description). The left ventricular internal cavity size was moderately dilated. There is mild left ventricular hypertrophy. Left ventricular diastolic parameters are consistent with Grade II diastolic dysfunction (pseudonormalization).  2. Right ventricular systolic function is normal. The right ventricular size is normal. Tricuspid regurgitation signal is inadequate for assessing PA pressure.  3. Left atrial size was mildly dilated.  4. Right atrial size was mildly dilated.  5. The mitral valve is grossly normal. Mild mitral valve regurgitation.  6. The aortic valve is tricuspid. Aortic valve regurgitation is not visualized. There is a rounded echodensity seen at the aortic valve coaptation point in the parasternal long axis view only. Although cannot exclude vegetation, this could be visualized  calcified left coronary leaflet tip based on other views. If endocarditis is suspected clinically, would suggest TEE.  7. The inferior vena cava is dilated in size with >50% respiratory variability, suggesting right atrial pressure of 8 mmHg. FINDINGS  Left Ventricle: Left ventricular ejection fraction, by estimation, is 30 to 35%. The left ventricle has moderately decreased function. The left ventricle demonstrates regional wall motion abnormalities. Definity contrast agent was given IV to delineate the left ventricular endocardial borders. The left ventricular internal cavity size was moderately dilated. There is mild left ventricular hypertrophy. Left ventricular diastolic parameters are consistent with Grade II diastolic dysfunction (pseudonormalization).  LV Wall  Scoring: The mid and distal anterior septum, mid inferoseptal segment, apical inferior segment, and apex are akinetic. The mid and distal anterior wall and apical  lateral segment are hypokinetic. The antero-lateral wall, inferior wall, posterior wall, basal anteroseptal segment, basal anterior segment, and basal inferoseptal segment are normal. Right Ventricle: The right ventricular size is normal. No increase in right ventricular wall thickness. Right ventricular systolic function is normal. Tricuspid regurgitation signal is inadequate for assessing PA pressure. Left Atrium: Left atrial size was mildly dilated. Right Atrium: Right atrial size was mildly dilated. Pericardium: There is no evidence of pericardial effusion. Mitral Valve: The mitral valve is grossly normal. Mild mitral valve regurgitation. Tricuspid Valve: The tricuspid valve is grossly normal. Tricuspid valve regurgitation is trivial. Aortic Valve: The aortic valve is tricuspid. Aortic valve regurgitation is not visualized. Pulmonic Valve: The pulmonic valve was grossly normal. Pulmonic valve regurgitation is trivial. Aorta: The aortic root is normal in size and structure. Venous: The inferior vena cava is dilated in size with greater than 50% respiratory variability, suggesting right atrial pressure of 8 mmHg. IAS/Shunts: No atrial level shunt detected by color flow Doppler.  LEFT VENTRICLE PLAX 2D LVIDd:         7.10 cm  Diastology LVIDs:         5.20 cm  LV e' medial:    5.55 cm/s LV PW:         1.20 cm  LV E/e' medial:  23.4 LV IVS:        1.00 cm  LV e' lateral:   8.27 cm/s LVOT diam:     2.20 cm  LV E/e' lateral: 15.7 LV SV:         89 LV SV Index:   31 LVOT Area:     3.80 cm  RIGHT VENTRICLE RV S prime:     11.20 cm/s TAPSE (M-mode): 2.3 cm LEFT ATRIUM              Index       RIGHT ATRIUM           Index LA diam:        4.70 cm  1.63 cm/m  RA Area:     27.80 cm LA Vol (A2C):   94.4 ml  32.65 ml/m RA Volume:   109.00 ml 37.70 ml/m LA Vol  (A4C):   108.0 ml 37.35 ml/m LA Biplane Vol: 110.0 ml 38.05 ml/m  AORTIC VALVE LVOT Vmax:   106.00 cm/s LVOT Vmean:  68.800 cm/s LVOT VTI:    0.234 m  AORTA Ao Root diam: 3.70 cm MITRAL VALVE MV Area (PHT): 4.60 cm     SHUNTS MV Decel Time: 165 msec     Systemic VTI:  0.23 m MV E velocity: 130.00 cm/s  Systemic Diam: 2.20 cm MV A velocity: 55.30 cm/s MV E/A ratio:  2.35 Rozann Lesches MD Electronically signed by Rozann Lesches MD Signature Date/Time: 11/28/2020/4:55:56 PM    Final       ASSESSMENT:  1.  Multiple myeloma: -Immunofixation has been negative since 2017. Recent labs, no M protein, normal K/L ratio, normal immunoglobulins. No concern for recurrence Can continue to monitor.   2.Normocytic normochromic anemia Likely from worsening CKD No overt iron def, Will change the epo dose to 10000 units every other week, current dose is too low for him Discussed increased risk of DVT/PE with epo. Repeat labs 2 weeks after change in epo dose FU with Dr Raliegh Ip in 4 weeks given worsening anemia.  Benay Pike MD

## 2020-11-28 NOTE — Progress Notes (Signed)
*  PRELIMINARY RESULTS* Echocardiogram 2D Echocardiogram has been performed with Definity.  Samuel Germany 11/28/2020, 4:07 PM

## 2020-11-29 ENCOUNTER — Inpatient Hospital Stay (HOSPITAL_COMMUNITY): Payer: BC Managed Care – PPO | Attending: Hematology and Oncology | Admitting: Hematology and Oncology

## 2020-11-29 ENCOUNTER — Encounter (HOSPITAL_COMMUNITY): Payer: Self-pay | Admitting: Hematology and Oncology

## 2020-11-29 VITALS — BP 146/73 | HR 57 | Temp 96.8°F | Resp 18 | Wt 397.5 lb

## 2020-11-29 DIAGNOSIS — E785 Hyperlipidemia, unspecified: Secondary | ICD-10-CM | POA: Diagnosis not present

## 2020-11-29 DIAGNOSIS — I252 Old myocardial infarction: Secondary | ICD-10-CM | POA: Insufficient documentation

## 2020-11-29 DIAGNOSIS — N183 Chronic kidney disease, stage 3 unspecified: Secondary | ICD-10-CM | POA: Insufficient documentation

## 2020-11-29 DIAGNOSIS — D631 Anemia in chronic kidney disease: Secondary | ICD-10-CM | POA: Insufficient documentation

## 2020-11-29 DIAGNOSIS — I10 Essential (primary) hypertension: Secondary | ICD-10-CM | POA: Insufficient documentation

## 2020-11-29 DIAGNOSIS — G4730 Sleep apnea, unspecified: Secondary | ICD-10-CM | POA: Diagnosis not present

## 2020-11-29 DIAGNOSIS — C9000 Multiple myeloma not having achieved remission: Secondary | ICD-10-CM

## 2020-11-29 DIAGNOSIS — I4891 Unspecified atrial fibrillation: Secondary | ICD-10-CM | POA: Diagnosis not present

## 2020-11-29 DIAGNOSIS — Z79899 Other long term (current) drug therapy: Secondary | ICD-10-CM | POA: Insufficient documentation

## 2020-11-29 DIAGNOSIS — D649 Anemia, unspecified: Secondary | ICD-10-CM | POA: Diagnosis not present

## 2020-11-30 ENCOUNTER — Other Ambulatory Visit (HOSPITAL_COMMUNITY): Payer: Self-pay

## 2020-11-30 DIAGNOSIS — D649 Anemia, unspecified: Secondary | ICD-10-CM

## 2020-11-30 DIAGNOSIS — C9000 Multiple myeloma not having achieved remission: Secondary | ICD-10-CM

## 2020-12-06 ENCOUNTER — Telehealth: Payer: Self-pay

## 2020-12-06 NOTE — Telephone Encounter (Signed)
-----   Message from Arnoldo Lenis, MD sent at 12/06/2020  4:02 PM EDT ----- Heart function remains decreased at 30-35% (normal is 50-60%). Can we refer him to EP to discuss an ICD (we had discussed this some at our visit). Continue current meds   Zandra Abts MD

## 2020-12-06 NOTE — Telephone Encounter (Signed)
Spoke to pt and wife about referral to EP. Pt would like to schedule an appt with EP, Dr. Lovena Le, in the [place] office for a consult. Pt agreed to continue current medications.

## 2020-12-10 DIAGNOSIS — R809 Proteinuria, unspecified: Secondary | ICD-10-CM | POA: Diagnosis not present

## 2020-12-10 DIAGNOSIS — N189 Chronic kidney disease, unspecified: Secondary | ICD-10-CM | POA: Diagnosis not present

## 2020-12-10 DIAGNOSIS — E1122 Type 2 diabetes mellitus with diabetic chronic kidney disease: Secondary | ICD-10-CM | POA: Diagnosis not present

## 2020-12-10 DIAGNOSIS — D638 Anemia in other chronic diseases classified elsewhere: Secondary | ICD-10-CM | POA: Diagnosis not present

## 2020-12-11 ENCOUNTER — Other Ambulatory Visit: Payer: Self-pay

## 2020-12-11 ENCOUNTER — Encounter (HOSPITAL_COMMUNITY)
Admission: RE | Admit: 2020-12-11 | Discharge: 2020-12-11 | Disposition: A | Discharge status: Home / Self Care | Payer: BC Managed Care – PPO | Source: Ambulatory Visit | Attending: Nephrology | Admitting: Nephrology

## 2020-12-11 DIAGNOSIS — E1142 Type 2 diabetes mellitus with diabetic polyneuropathy: Secondary | ICD-10-CM | POA: Diagnosis not present

## 2020-12-11 DIAGNOSIS — D631 Anemia in chronic kidney disease: Secondary | ICD-10-CM | POA: Insufficient documentation

## 2020-12-11 DIAGNOSIS — N184 Chronic kidney disease, stage 4 (severe): Secondary | ICD-10-CM | POA: Diagnosis not present

## 2020-12-11 LAB — POCT HEMOGLOBIN-HEMACUE: Hemoglobin: 8.2 g/dL — ABNORMAL LOW (ref 13.0–17.0)

## 2020-12-11 MED ORDER — SODIUM CHLORIDE 0.9 % IV SOLN: INTRAVENOUS | Status: DC

## 2020-12-11 MED ORDER — EPOETIN ALFA-EPBX 3000 UNIT/ML IJ SOLN
3000.0000 [IU] | Freq: Once | INTRAMUSCULAR | Status: AC
  Administered 2020-12-11: 3000 [IU] via SUBCUTANEOUS
  Filled 2020-12-11: qty 1

## 2020-12-13 DIAGNOSIS — N189 Chronic kidney disease, unspecified: Secondary | ICD-10-CM | POA: Diagnosis not present

## 2020-12-13 DIAGNOSIS — D631 Anemia in chronic kidney disease: Secondary | ICD-10-CM | POA: Diagnosis not present

## 2020-12-13 DIAGNOSIS — R809 Proteinuria, unspecified: Secondary | ICD-10-CM | POA: Diagnosis not present

## 2020-12-13 DIAGNOSIS — D638 Anemia in other chronic diseases classified elsewhere: Secondary | ICD-10-CM | POA: Diagnosis not present

## 2020-12-18 ENCOUNTER — Other Ambulatory Visit: Payer: Self-pay

## 2020-12-18 ENCOUNTER — Encounter (HOSPITAL_COMMUNITY)
Admission: RE | Admit: 2020-12-18 | Discharge: 2020-12-18 | Disposition: A | Discharge status: Home / Self Care | Payer: BC Managed Care – PPO | Source: Ambulatory Visit | Attending: Nephrology | Admitting: Nephrology

## 2020-12-18 DIAGNOSIS — N184 Chronic kidney disease, stage 4 (severe): Secondary | ICD-10-CM | POA: Diagnosis not present

## 2020-12-18 DIAGNOSIS — D631 Anemia in chronic kidney disease: Secondary | ICD-10-CM | POA: Diagnosis not present

## 2020-12-18 LAB — POCT HEMOGLOBIN-HEMACUE: Hemoglobin: 8.9 g/dL — ABNORMAL LOW (ref 13.0–17.0)

## 2020-12-18 MED ORDER — EPOETIN ALFA-EPBX 10000 UNIT/ML IJ SOLN
10000.0000 [IU] | Freq: Once | INTRAMUSCULAR | Status: AC
  Administered 2020-12-18: 10000 [IU] via SUBCUTANEOUS

## 2020-12-18 MED ORDER — EPOETIN ALFA-EPBX 10000 UNIT/ML IJ SOLN
INTRAMUSCULAR | Status: AC
  Filled 2020-12-18: qty 1

## 2020-12-19 ENCOUNTER — Ambulatory Visit: Payer: BC Managed Care – PPO | Admitting: Internal Medicine

## 2020-12-19 ENCOUNTER — Encounter: Payer: Self-pay | Admitting: Internal Medicine

## 2020-12-19 VITALS — BP 166/98 | HR 88 | Ht 74.0 in | Wt 390.2 lb

## 2020-12-19 DIAGNOSIS — I5022 Chronic systolic (congestive) heart failure: Secondary | ICD-10-CM | POA: Diagnosis not present

## 2020-12-19 NOTE — Progress Notes (Signed)
HPI [name] returns today after a long absence from our EP clinic. I saw him over 6 years ago and he had just been treated for mulitple myeloma and had an indwelling port o cath. He has chronic renal insufficiency with creatinine in the 2.6 range. He has morbid obesity and has gained 70 lbs and has been as much as 400 lbs. He is quite sedentary and has class 3 CHF symptoms. The patient's bp has not been well controlled. He has not been on an ACE due to renal insufficiency. He has not had syncope and he denies chest pain.  Allergies  Allergen Reactions   Beef-Derived Products Diarrhea   Pork-Derived Products Diarrhea   Other     Band aids      Current Outpatient Medications  Medication Sig Dispense Refill   acetaminophen (TYLENOL) 500 MG tablet Take 500-1,000 mg by mouth every 6 (six) hours as needed for mild pain or moderate pain.     acyclovir (ZOVIRAX) 400 MG tablet Take 1 tablet (400 mg total) by mouth every morning. 30 tablet 5   allopurinol (ZYLOPRIM) 300 MG tablet TAKE ONE TABLET BY MOUTH ONCE DAILY. 30 tablet 2   atorvastatin (LIPITOR) 80 MG tablet TAKE ONE TABLET BY MOUTH AT BEDTIME. 90 tablet 3   Cholecalciferol (D2000 ULTRA STRENGTH) 50 MCG (2000 UT) CAPS Take by mouth.     CONTOUR NEXT TEST test strip      dicyclomine (BENTYL) 20 MG tablet TAKE 1 TABLET BY MOUTH BEFORE MEALS 3 TIMES DAILY. 90 tablet 2   Eluxadoline 100 MG TABS Take 100 mg by mouth 2 (two) times daily with a meal.     ferrous sulfate 325 (65 FE) MG EC tablet Take 45 mg by mouth. Every other day     gabapentin (NEURONTIN) 600 MG tablet TAKE 1 TABLET BY MOUTH ONCE A DAY. 30 tablet 0   hydrALAZINE (APRESOLINE) 100 MG tablet Take 1 tablet (100 mg total) by mouth 3 (three) times daily. 270 tablet 3   insulin aspart (NOVOLOG) 100 UNIT/ML injection Inject into the skin. Sliding scale over 150= 1 unit, 200= 2 units,250= 3 units     insulin detemir (LEVEMIR) 100 UNIT/ML injection Inject 0.1 mLs (10 Units  total) into the skin every morning. 10 mL 11   INSULIN SYRINGE .5CC/29G 29G X 1/2" 0.5 ML MISC      lisinopril (ZESTRIL) 5 MG tablet Take 5 mg by mouth daily.     loratadine (CLARITIN) 10 MG tablet Take 10 mg by mouth every morning.     Magnesium Cl-Calcium Carbonate (SLOW-MAG PO) Take 1 tablet by mouth every morning.     Microlet Lancets MISC      Multiple Vitamins-Minerals (MULTIVITAMINS THER. W/MINERALS) TABS Take 1 tablet by mouth daily.     nitroGLYCERIN (NITROSTAT) 0.4 MG SL tablet DISSOLVE 1 TABLET UNDER TONGUE EVERY 5 MINUTES UP TO 15 MIN FOR CHESTPAIN. IF NO RELIEF CALL 911. 25 tablet 0   pantoprazole (PROTONIX) 40 MG tablet Take 40 mg by mouth daily.     potassium chloride SA (KLOR-CON) 20 MEQ tablet Take 20 mEq by mouth daily. Every other day     sodium bicarbonate 650 MG tablet Take 650 mg by mouth 3 (three) times daily.     SURE COMFORT PEN NEEDLES 29G X 12.7MM MISC USE AS DIRECTED FOURCTIMES DAILY.     torsemide (DEMADEX) 20 MG tablet Take 40 mg by mouth every other day alternating with  20 mg     Artificial Tear Ointment (DRY EYES OP) Place 1 drop into both eyes as needed (dry eyes). (Patient not taking: No sig reported)     cyclobenzaprine (FLEXERIL) 5 MG tablet Take by mouth. (Patient not taking: Reported on 12/19/2020)     isosorbide mononitrate (IMDUR) 30 MG 24 hr tablet Take 0.5 tablets (15 mg total) by mouth daily. 45 tablet 3   No current facility-administered medications for this visit.   Facility-Administered Medications Ordered in Other Visits  Medication Dose Route Frequency Provider Last Rate Last Admin   sodium chloride 0.9 % injection 10 mL  10 mL Intravenous Once Farrel Gobble, MD         Past Medical History:  Diagnosis Date   Abscess 04/2017   Scrotal   Anemia    Arteriosclerotic cardiovascular disease (ASCVD)    MI-2000s; stent to the proximal LAD and diagonal in 2001; stress nuclear in 2008-impaired exercise capacity, left ventricular dilatation,  moderately to severely depressed EF, apical, inferior and anteroseptal scar   Arthritis    Atrial flutter (HCC)    Bence-Jones proteinuria 05/05/2011   Cellulitis of leg    both legs   Chronic diarrhea    Chronic kidney disease, stage 3, mod decreased GFR (HCC)    Creatinine of 1.84 in 06/2011 and 1.5 in 07/2011   Diabetes mellitus    Insulin   Dysrhythmia    AFlutter   GERD (gastroesophageal reflux disease)    Gout    Hyperlipidemia    Hypertension    Injection site reaction    Multiple myeloma 07/01/2011   Myocardial infarction ([place]) 2000   Obesity    Pedal edema    Venous insufficiency   Sleep apnea    uses cpap   Ulcer     ROS:   All systems reviewed and negative except as noted in the HPI.   Past Surgical History:  Procedure Laterality Date   ABSCESS DRAINAGE     Scrotal   BIOPSY  01/02/2012   Procedure: BIOPSY;  Surgeon: Rogene Houston, MD;  [facility];  Service: Endoscopy;  Laterality: N/A;   BONE MARROW BIOPSY  05/13/11   CARDIAC CATHETERIZATION     cardiac stent   CARDIOVERSION N/A 10/13/2012   Procedure: CARDIOVERSION;  Surgeon: Yehuda Savannah, MD;  Location: [facility];  Service: Cardiovascular;  Laterality: N/A;   CATARACT EXTRACTION W/PHACO Left 02/13/2014   Procedure: CATARACT EXTRACTION PHACO AND INTRAOCULAR LENS PLACEMENT ([place]);  Surgeon: Tonny Branch, MD;  Location: [facility];  Service: Ophthalmology;  Laterality: Left;  CDE:  7.67   CATARACT EXTRACTION W/PHACO Right 03/02/2014   Procedure: CATARACT EXTRACTION PHACO AND INTRAOCULAR LENS PLACEMENT RIGHT EYE CDE=16.81;  Surgeon: Tonny Branch, MD;  Location: [facility];  Service: Ophthalmology;  Laterality: Right;   COLONOSCOPY  11/28/2011   Procedure: COLONOSCOPY;  Surgeon: Rogene Houston, MD;  [facility];  Service: Endoscopy;  Laterality: N/A;  [place] Right 12/16/2017   Procedure: CYSTOSCOPY WITH RIGHT RETROGRADE PYELOGRAM AND RIGHT URETERAL STENT REMOVAL;  Surgeon:  Cleon Gustin, MD;  Location[facility];  Service: Urology;  Laterality: Right;   CYSTOSCOPY W/ URETERAL STENT PLACEMENT Right 07/31/2017   Procedure: CYSTOSCOPY WITH RETROGRADE PYELOGRAM/URETERAL STENT PLACEMENT;  Surgeon: Cleon Gustin, MD;  [facility];  Service: Urology;  Laterality: Right;   ESOPHAGOGASTRODUODENOSCOPY  01/02/2012   Procedure: ESOPHAGOGASTRODUODENOSCOPY (EGD);  Surgeon: Rogene Houston, MD;  [facility];  Service: Endoscopy;  Laterality: N/A;  100   ESOPHAGOGASTRODUODENOSCOPY N/A 09/20/2012   Procedure: ESOPHAGOGASTRODUODENOSCOPY (EGD);  Surgeon: Rogene Houston, MD;  [facility];  Service: Endoscopy;  Laterality: N/A;   EUS N/A 10/07/2012   Procedure: UPPER ENDOSCOPIC ULTRASOUND (EUS) LINEAR;  Surgeon: Milus Banister, MD;  [facility];  Service: Endoscopy;  Laterality: N/A;   INCISION AND DRAINAGE ABSCESS N/A 04/14/2017   Procedure: INCISION AND DRAINAGE ABSCESS;  Surgeon: Ceasar Mons, MD;  [facility];  Service: Urology;  Laterality: N/A;   INCISION AND DRAINAGE ABSCESS ANAL     IRRIGATION AND DEBRIDEMENT ABSCESS N/A 04/15/2017   Procedure: DEBRIDEMENT SCROTAL WOUND AND DRESSING CHANGE;  Surgeon: Ceasar Mons, MD;  [facility];  Service: Urology;  Laterality: N/A;   IRRIGATION AND DEBRIDEMENT ABSCESS N/A 04/17/2017   Procedure: IRRIGATION AND DEBRIDEMENT ABSCESS;  Surgeon: Ceasar Mons, MD;  [facility];  Service: Urology;  Laterality: N/A;  RM 3   LAPAROSCOPIC GASTRIC BANDING  2006   has been removed   OSTECTOMY Right 04/08/2017   Procedure: OSTECTOMY RIGHT GREAT TOE;  Surgeon: Caprice Beaver, DPM;  [facility];  Service: Podiatry;  Laterality: Right;   PORT-A-CATH REMOVAL Left 12/07/2012   Procedure: REMOVAL PORT-A-CATH;  Surgeon: Scherry Ran, MD;  [facility];  Service: General;  Laterality: Left;   PORT-A-CATH REMOVAL N/A 10/02/2017   Procedure: MINOR  REMOVAL PORT-A-CATH AT BEDSIDE;  Surgeon: Donnie Mesa, MD;  Location: [facility];  Service: General;  Laterality: N/A;   PORTACATH PLACEMENT  07/07/2011   Procedure: INSERTION PORT-A-CATH;  Surgeon: Scherry Ran, MD;  [facility];  Service: General;  Laterality: N/A;   PORTACATH PLACEMENT N/A 12/07/2012   Procedure: INSERTION PORT-A-CATH;  Surgeon: Scherry Ran, MD;  [facility];  Service: General;  Laterality: N/A;  Attempted portacath placement on left and right side   URETEROSCOPY Right 12/16/2017   Procedure: DIAGNOSTIC RIGHT URETEROSCOPY;  Surgeon: Cleon Gustin, MD;  [facility];  Service: Urology;  Laterality: Right;   WOUND DEBRIDEMENT Right 04/08/2017   Procedure: EXCISION ULCERATION RIGHT GREAT TOE;  Surgeon: Caprice Beaver, DPM;  [facility];  Service: Podiatry;  Laterality: Right;   WRIST SURGERY     Left; removal of bone fragment     Family History  Problem Relation Age of Onset   Heart disease Mother    Cancer Mother    Diabetes Father    Arthritis Other    Anesthesia problems Neg Hx    Hypotension Neg Hx    Malignant hyperthermia Neg Hx    Pseudochol deficiency Neg Hx      Social History   Socioeconomic History   Marital status: Married    Spouse name: Not on file   Number of children: Not on file   Years of education: 16   Highest education level: Not on file  Occupational History   Occupation: Nurse, learning disability   Occupation: Optometrist: [employer]  Tobacco Use   Smoking status: Former    Packs/day: 0.25    Years: 1.00    Pack years: 0.25    Types: Cigarettes, Cigars    Quit date: 05/17/2001    Years since quitting: 19.6   Smokeless tobacco: Never  Vaping Use   Vaping Use: Never used  Substance and Sexual Activity   Alcohol use: No    Alcohol/week: 0.0 standard  drinks   Drug use: No   Sexual activity: Yes    Birth control/protection: None  Other Topics Concern   Not on file  Social  History Narrative   Not on file   Social Determinants of Health   Financial Resource Strain: Not on file  Food Insecurity: Not on file  Transportation Needs: Not on file  Physical Activity: Not on file  Stress: Not on file  Social Connections: Not on file  Intimate Partner Violence: Not on file     BP (!) 166/98   Pulse 88   Ht '6\' 2"'  (1.88 m)   Wt (!) 390 lb 3.2 oz (177 kg)   SpO2 98%   BMI 50.10 kg/m   Physical Exam:  Well appearing NAD HEENT: Unremarkable Neck:  No JVD, no thyromegally Lymphatics:  No adenopathy Back:  No CVA tenderness Lungs:  Clear with no wheezes HEART:  Regular rate rhythm, no murmurs, no rubs, no clicks Abd:  soft, positive bowel sounds, no organomegally, no rebound, no guarding Ext:  2 plus pulses, no edema, no cyanosis, no clubbing Skin:  No rashes no nodules Neuro:  CN II through XII intact, motor grossly intact  EKG - reviewed. NSR with marked first degree AV block  Assess/Plan:  Non-ischemic CM - he has an EF of 30-35% despite maximal medical therapy. I discussed the treatment options. He is not a great candidate for ICD insertion. He describe an episode of Fournier's gangrene 3 years ago, though his port o cath has been removed. We discussed the infectious risks which are not trivial. I have recommended he and I both reach out and discuss and come back with a decision. Obesity - he desperately needs to lose weight. He is up 70 lbs since I saw him years ago. Chronic systolic heart failure - he has class 3 symptoms despite maximal medical therapy.   Carleene Overlie Zaire Vanbuskirk,MD

## 2020-12-19 NOTE — Patient Instructions (Signed)
Medication Instructions:  Your physician recommends that you continue on your current medications as directed. Please refer to the Current Medication list given to you today.  *If you need a refill on your cardiac medications before your next appointment, please call your pharmacy*   Lab Work: NONE   If you have labs (blood work) drawn today and your tests are completely normal, you will receive your results only by: [place] (if you have MyChart) OR A paper copy in the mail If you have any lab test that is abnormal or we need to change your treatment, we will call you to review the results.   Testing/Procedures:  Please ask Dr. Harl Bowie about Defibrillator implant    Follow-Up: At [facility], you and your health needs are our priority.  As part of our continuing mission to provide you with exceptional heart care, we have created designated Provider Care Teams.  These Care Teams include your primary Cardiologist (physician) and Advanced Practice Providers (APPs -  Physician Assistants and Nurse Practitioners) who all work together to provide you with the care you need, when you need it.  We recommend signing up for the patient portal called "MyChart".  Sign up information is provided on this After Visit Summary.  MyChart is used to connect with patients for Virtual Visits (Telemedicine).  Patients are able to view lab/test results, encounter notes, upcoming appointments, etc.  Non-urgent messages can be sent to your provider as well.   To learn more about what you can do with MyChart, go to [email or web address].    Your next appointment:    Pending implant   The format for your next appointment:   In Person  Provider:   Cristopher Peru, MD   Other Instructions Thank you for choosing [facility]!   Cardioverter Defibrillator Implantation An implantable cardioverter defibrillator (ICD) is a device that identifies and corrects abnormal heart rhythms.  Cardioverter defibrillator implantation is a surgery to place an ICD under the skin in the chest or abdomen. An ICD has a battery, a small computer (pulse generator), and wires (leads) that go into the heart. The ICD detects and corrects two types of dangerous irregular heart rhythms (arrhythmias): A rapid heart rhythm in the lower chambers of the heart (ventricles). This is called ventricular tachycardia. The ventricles contracting in an uncoordinated way. This is called ventricular fibrillation. There are different types of ICDs, and the electrical signals from the ICD can be programmed differently based on the condition being treated. The electrical signals from the ICD can be low-energy pulses, high-energy shocks, or a combination of the two. The low-energy pulses are generally used to restore the heartbeat to normal when it is either too slow (bradycardia) or too fast. These pulses are painless. The high-energy shocks are used to treat abnormal rhythms such as ventricular tachycardia or ventricularfibrillation. This shock may feel like a strong jolt in the chest. Your health care provider may recommend an ICD if you have: Had a ventricular arrhythmia in the past. A damaged heart because of a disease or heart condition. A weakened heart muscle from a heart attack or cardiac arrest. A congenital heart defect. Long QT syndrome, which is a disorder of the heart's electrical system. Brugada syndrome, which is a condition that causes a disruption of the heart's normal rhythm. Tell a health care provider about: Any allergies you have. All medicines you are taking, including vitamins, herbs, eye drops, creams, and over-the-counter medicines. Any problems you or family members have  had with anesthetic medicines. Any blood disorders you have. Any surgeries you have had. Any medical conditions you have. Whether you are pregnant or may be pregnant. What are the risks? Generally, this is a safe  procedure. However, problems may occur, including: Infection. Bleeding. Allergic reactions to medicines used during the procedure. Blood clots. Swelling or bruising. Damage to nearby structures or organs, such as nerves, lungs, blood vessels, or the heart where the ICD leads or pulse generator is implanted. What happens before the procedure? Staying hydrated Follow instructions from your health care provider about hydration, which may include: Up to 2 hours before the procedure - you may continue to drink clear liquids, such as water, clear fruit juice, black coffee, and plain tea.  Eating and drinking restrictions Follow instructions from your health care provider about eating and drinking, which may include: 8 hours before the procedure - stop eating heavy meals or foods, such as meat, fried foods, or fatty foods. 6 hours before the procedure - stop eating light meals or foods, such as toast or cereal. 6 hours before the procedure - stop drinking milk or drinks that contain milk. 2 hours before the procedure - stop drinking clear liquids. Medicines Ask your health care provider about: Changing or stopping your regular medicines. This is especially important if you are taking diabetes medicines or blood thinners. Taking medicines such as aspirin and ibuprofen. These medicines can thin your blood. Do not take these medicines unless your health care provider tells you to take them. Taking over-the-counter medicines, vitamins, herbs, and supplements. Tests You may have an exam or testing. These may include: Blood tests. A test to check the electrical signals in your heart (electrocardiogram, ECG). Imaging tests, such as a chest X-ray. Echocardiogram. This is an ultrasound of your heart to evaluate your heart structures and function. An event monitor or Holter monitor to wear at home. General instructions Do not use any products that contain nicotine or tobacco for at least 4 weeks before  the procedure. These products include cigarettes, chewing tobacco, and vaping devices, such as e-cigarettes. If you need help quitting, ask your health care provider. Ask your health care provider: How your procedure site will be marked. What steps will be taken to help prevent infection. These may include: Removing hair at the surgery site. Washing skin with a germ-killing soap. Taking antibiotic medicine. You may be asked to shower with a germ-killing soap. Plan to have a responsible adult take you home from the hospital or clinic. What happens during the procedure?  Small monitors will be put on your body. They will be used to check your heart rate, blood pressure, and oxygen level. A pair of sticky pads (defibrillator pads) may be placed on your back and chest. These pads are able to pace your heart as needed during the procedure. An IV will be inserted into one of your veins. You will be given one or more of the following: A medicine to help you relax (sedative). A medicine to numb the area (local anesthetic). A medicine to make you fall asleep(general anesthetic). A small incision will be made to create a deep pocket under the skin of your chest or abdomen. Leads will be guided through a blood vessel into your heart and attached to your heart muscles. Depending on the ICD, the leads may go into one ventricle, or they may go into both ventricles and into an upper chamber of the heart. An X-ray machine (fluoroscope) will be used to  help guide the leads. The other end of the leads will be attached to the pulse generator. The pulse generator will be placed into the pocket under the skin. The ICD will be tested, and your health care provider will program the ICD for the condition being treated. The incision will be closed with stitches (sutures), skin glue, adhesive strips, or staples. A bandage (dressing) will be placed over the incision. The procedure may vary among health care providers  and hospitals. What happens after the procedure? Your blood pressure, heart rate, breathing rate, and blood oxygen level will be monitored until you leave the hospital or clinic. Your health care provider will also monitor your ICD to make sure it is working properly. A chest X-ray will be taken to check that the ICD is in the right place. Do not raise the arm on the side of your procedure higher than your shoulder for as long as told by your health care provider. This is usually at least 6 weeks. You may be given an identification card explaining that you have an ICD. You will be given a remote home monitoring device to use with your ICD to allow your device to communicate with your clinic. Summary An implantable cardioverter defibrillator (ICD) is a device that identifies and corrects abnormal heart rhythms. Cardioverter defibrillator implantation is a surgery to place an ICD under the skin in the chest or abdomen. An ICD consists of a battery, a small computer (pulse generator), and wires (leads) that go into the heart. During the procedure, the ICD will be tested, and your health care provider will program the ICD for the condition being treated. After the procedure, a chest X-ray will be taken to check that the ICD is in the right place. This information is not intended to replace advice given to you by your health care provider. Make sure you discuss any questions you have with your healthcare provider. Document Revised: 11/16/2019 Document Reviewed: 11/16/2019 Elsevier Patient Education  [place].

## 2020-12-21 ENCOUNTER — Ambulatory Visit: Payer: BC Managed Care – PPO | Admitting: Cardiology

## 2020-12-21 ENCOUNTER — Encounter: Payer: Self-pay | Admitting: Cardiology

## 2020-12-21 ENCOUNTER — Other Ambulatory Visit: Payer: Self-pay

## 2020-12-21 VITALS — BP 162/82 | HR 58 | Ht 74.0 in | Wt 388.0 lb

## 2020-12-21 DIAGNOSIS — I2510 Atherosclerotic heart disease of native coronary artery without angina pectoris: Secondary | ICD-10-CM | POA: Diagnosis not present

## 2020-12-21 DIAGNOSIS — I4892 Unspecified atrial flutter: Secondary | ICD-10-CM | POA: Diagnosis not present

## 2020-12-21 DIAGNOSIS — I5022 Chronic systolic (congestive) heart failure: Secondary | ICD-10-CM | POA: Diagnosis not present

## 2020-12-21 DIAGNOSIS — G4733 Obstructive sleep apnea (adult) (pediatric): Secondary | ICD-10-CM | POA: Diagnosis not present

## 2020-12-21 NOTE — Patient Instructions (Signed)
Medication Instructions:  Your physician recommends that you continue on your current medications as directed. Please refer to the Current Medication list given to you today.  *If you need a refill on your cardiac medications before your next appointment, please call your pharmacy*   Lab Work: NONE  If you have labs (blood work) drawn today and your tests are completely normal, you will receive your results only by: . MyChart Message (if you have MyChart) OR . A paper copy in the mail If you have any lab test that is abnormal or we need to change your treatment, we will call you to review the results.   Testing/Procedures: NONE    Follow-Up: At [facility], you and your health needs are our priority.  As part of our continuing mission to provide you with exceptional heart care, we have created designated Provider Care Teams.  These Care Teams include your primary Cardiologist (physician) and Advanced Practice Providers (APPs -  Physician Assistants and Nurse Practitioners) who all work together to provide you with the care you need, when you need it.  We recommend signing up for the patient portal called "MyChart".  Sign up information is provided on this After Visit Summary.  MyChart is used to connect with patients for Virtual Visits (Telemedicine).  Patients are able to view lab/test results, encounter notes, upcoming appointments, etc.  Non-urgent messages can be sent to your provider as well.   To learn more about what you can do with MyChart, go to [email or web address].    Your next appointment:   4 month(s)  The format for your next appointment:   In Person  Provider:   Jonathan Branch, MD   Other Instructions Thank you for choosing [facility]!    

## 2020-12-21 NOTE — Progress Notes (Signed)
Clinical Summary [name] is a 60 y.o.maleseen today for follow up of the following medical problems. Focused visit on history of CAD and chronic systolic HF.    1. CAD - prior stents as reported below -no recent symptom   2. Chronic systolic HF - 6599 echo LVEF 35-40% - 2015 LVEF 25-30% 2017 LVEF 35-40%   - 07/2019 ehco LVEF 25-30%, grade II diastolic dysfxn. -Limited ACE inhibitor's or angiotensin receptor blockers due to advanced chronic kidney disease - off beta blocker due to bradycardia from prior Dr Raliegh Ip notes.      - weight 385 12/2019, up to 399 at 06/2020 appt, today 404 lbs - last visit we started hydral/nitrates - at Jan 27 appt with neprhology had increased swelling, torsemide increased to 54m alternating with 237m Sensitive to diuretics given intermittent diarrhea - since change swelling is stable. Home weights 399 lbs last by home - mild swelling in legs, can feel abdominal distension and some SOB. Chronically sleeps at angle - chronic diarrhea that can exacebate at times.    - home weights normal range typically 386 to 392 lbs - taking torsemide 4064mlternating with 78m39m home bp's usually 130s/70s-80s    10/2020 echo LVEF 30-35%, grade II dd - no recent edema, no SOB/DOE - discussing possible ICD with Dr TaylLovena Leere are some concerns about potential infection risk.      [name]   Past Medical History:  Diagnosis Date   Abscess 04/2017   Scrotal   Anemia    Arteriosclerotic cardiovascular disease (ASCVD)    MI-2000s; stent to the proximal LAD and diagonal in 2001; stress nuclear in 2008-impaired exercise capacity, left ventricular dilatation, moderately to severely depressed EF, apical, inferior and anteroseptal scar   Arthritis    Atrial flutter (HCC)    Bence-Jones proteinuria 05/05/2011   Cellulitis of leg    both legs   Chronic  diarrhea    Chronic kidney disease, stage 3, mod decreased GFR (HCC)    Creatinine of 1.84 in 06/2011 and 1.5 in 07/2011   Diabetes mellitus    Insulin   Dysrhythmia    AFlutter   GERD (gastroesophageal reflux disease)    Gout    Hyperlipidemia    Hypertension    Injection site reaction    Multiple myeloma 07/01/2011   Myocardial infarction (HCC)[place]   Obesity    Pedal edema    Venous insufficiency   Sleep apnea    uses cpap   Ulcer      Allergies  Allergen Reactions   Beef-Derived Products Diarrhea   Pork-Derived Products Diarrhea   Other     Band aids      Current Outpatient Medications  Medication Sig Dispense Refill   acetaminophen (TYLENOL) 500 MG tablet Take 500-1,000 mg by mouth every 6 (six) hours as needed for mild pain or moderate pain.     acyclovir (ZOVIRAX) 400 MG tablet Take 1 tablet (400 mg total) by mouth every morning. 30 tablet 5   allopurinol (ZYLOPRIM) 300 MG tablet TAKE ONE TABLET BY MOUTH ONCE DAILY. 30 tablet 2   Artificial Tear Ointment (DRY EYES OP) Place 1 drop into both eyes as needed (dry eyes). (Patient not taking: No sig reported)     atorvastatin (LIPITOR) 80 MG tablet TAKE ONE TABLET BY MOUTH AT BEDTIME. 90 tablet 3   Cholecalciferol (D2000  ULTRA STRENGTH) 50 MCG (2000 UT) CAPS Take by mouth.     CONTOUR NEXT TEST test strip      cyclobenzaprine (FLEXERIL) 5 MG tablet Take by mouth. (Patient not taking: Reported on 12/19/2020)     dicyclomine (BENTYL) 20 MG tablet TAKE 1 TABLET BY MOUTH BEFORE MEALS 3 TIMES DAILY. 90 tablet 2   Eluxadoline 100 MG TABS Take 100 mg by mouth 2 (two) times daily with a meal.     ferrous sulfate 325 (65 FE) MG EC tablet Take 45 mg by mouth. Every other day     gabapentin (NEURONTIN) 600 MG tablet TAKE 1 TABLET BY MOUTH ONCE A DAY. 30 tablet 0   hydrALAZINE (APRESOLINE) 100 MG tablet Take 1 tablet (100 mg total) by mouth 3 (three) times daily. 270 tablet 3   insulin aspart (NOVOLOG) 100 UNIT/ML injection Inject  into the skin. Sliding scale over 150= 1 unit, 200= 2 units,250= 3 units     insulin detemir (LEVEMIR) 100 UNIT/ML injection Inject 0.1 mLs (10 Units total) into the skin every morning. 10 mL 11   INSULIN SYRINGE .5CC/29G 29G X 1/2" 0.5 ML MISC      isosorbide mononitrate (IMDUR) 30 MG 24 hr tablet Take 0.5 tablets (15 mg total) by mouth daily. 45 tablet 3   lisinopril (ZESTRIL) 5 MG tablet Take 5 mg by mouth daily.     loratadine (CLARITIN) 10 MG tablet Take 10 mg by mouth every morning.     Magnesium Cl-Calcium Carbonate (SLOW-MAG PO) Take 1 tablet by mouth every morning.     Microlet Lancets MISC      Multiple Vitamins-Minerals (MULTIVITAMINS THER. W/MINERALS) TABS Take 1 tablet by mouth daily.     nitroGLYCERIN (NITROSTAT) 0.4 MG SL tablet DISSOLVE 1 TABLET UNDER TONGUE EVERY 5 MINUTES UP TO 15 MIN FOR CHESTPAIN. IF NO RELIEF CALL 911. 25 tablet 0   pantoprazole (PROTONIX) 40 MG tablet Take 40 mg by mouth daily.     potassium chloride SA (KLOR-CON) 20 MEQ tablet Take 20 mEq by mouth daily. Every other day     sodium bicarbonate 650 MG tablet Take 650 mg by mouth 3 (three) times daily.     SURE COMFORT PEN NEEDLES 29G X 12.7MM MISC USE AS DIRECTED FOURCTIMES DAILY.     torsemide (DEMADEX) 20 MG tablet Take 40 mg by mouth every other day alternating with 20 mg     No current facility-administered medications for this visit.   Facility-Administered Medications Ordered in Other Visits  Medication Dose Route Frequency Provider Last Rate Last Admin   sodium chloride 0.9 % injection 10 mL  10 mL Intravenous Once Farrel Gobble, MD         Past Surgical History:  Procedure Laterality Date   ABSCESS DRAINAGE     Scrotal   BIOPSY  01/02/2012   Procedure: BIOPSY;  Surgeon: Rogene Houston, MD;  [facility];  Service: Endoscopy;  Laterality: N/A;   BONE MARROW BIOPSY  05/13/11   CARDIAC CATHETERIZATION     cardiac stent   CARDIOVERSION N/A 10/13/2012   Procedure: CARDIOVERSION;   Surgeon: Yehuda Savannah, MD;  Location: [facility];  Service: Cardiovascular;  Laterality: N/A;   CATARACT EXTRACTION W/PHACO Left 02/13/2014   Procedure: CATARACT EXTRACTION PHACO AND INTRAOCULAR LENS PLACEMENT ([place]);  Surgeon: Tonny , MD;  Location: [facility];  Service: Ophthalmology;  Laterality: Left;  CDE:  7.67   CATARACT EXTRACTION W/PHACO Right 03/02/2014   Procedure:  CATARACT EXTRACTION PHACO AND INTRAOCULAR LENS PLACEMENT RIGHT EYE CDE=16.81;  Surgeon: Tonny , MD;  Location: [facility];  Service: Ophthalmology;  Laterality: Right;   COLONOSCOPY  11/28/2011   Procedure: COLONOSCOPY;  Surgeon: Rogene Houston, MD;  [facility];  Service: Endoscopy;  Laterality: N/A;  [place] Right 12/16/2017   Procedure: CYSTOSCOPY WITH RIGHT RETROGRADE PYELOGRAM AND RIGHT URETERAL STENT REMOVAL;  Surgeon: Cleon Gustin, MD;  Location: [facility];  Service: Urology;  Laterality: Right;   CYSTOSCOPY W/ URETERAL STENT PLACEMENT Right 07/31/2017   Procedure: CYSTOSCOPY WITH RETROGRADE PYELOGRAM/URETERAL STENT PLACEMENT;  Surgeon: Cleon Gustin, MD;  Location: [facility];  Service: Urology;  Laterality: Right;   ESOPHAGOGASTRODUODENOSCOPY  01/02/2012   Procedure: ESOPHAGOGASTRODUODENOSCOPY (EGD);  Surgeon: Rogene Houston, MD;  [facility];  Service: Endoscopy;  Laterality: N/A;  100   ESOPHAGOGASTRODUODENOSCOPY N/A 09/20/2012   Procedure: ESOPHAGOGASTRODUODENOSCOPY (EGD);  Surgeon: Rogene Houston, MD;  [facility];  Service: Endoscopy;  Laterality: N/A;   EUS N/A 10/07/2012   Procedure: UPPER ENDOSCOPIC ULTRASOUND (EUS) LINEAR;  Surgeon: Milus Banister, MD;  [facility];  Service: Endoscopy;  Laterality: N/A;   INCISION AND DRAINAGE ABSCESS N/A 04/14/2017   Procedure: INCISION AND DRAINAGE ABSCESS;  Surgeon: Ceasar Mons, MD;  [facility]: [facility];  Service: Urology;  Laterality: N/A;   INCISION AND DRAINAGE ABSCESS ANAL      IRRIGATION AND DEBRIDEMENT ABSCESS N/A 04/15/2017   Procedure: DEBRIDEMENT SCROTAL WOUND AND DRESSING CHANGE;  Surgeon: Ceasar Mons, MD;  [facility]: [facility];  Service: Urology;  Laterality: N/A;   IRRIGATION AND DEBRIDEMENT ABSCESS N/A 04/17/2017   Procedure: IRRIGATION AND DEBRIDEMENT ABSCESS;  Surgeon: Ceasar Mons, MD;  [facility];  Service: Urology;  Laterality: N/A;  RM 3   LAPAROSCOPIC GASTRIC BANDING  2006   has been removed   OSTECTOMY Right 04/08/2017   Procedure: OSTECTOMY RIGHT GREAT TOE;  Surgeon: Caprice Beaver, DPM;  Location: [facility];  Service: Podiatry;  Laterality: Right;   PORT-A-CATH REMOVAL Left 12/07/2012   Procedure: REMOVAL PORT-A-CATH;  Surgeon: Scherry Ran, MD;  Location: [facility];  Service: General;  Laterality: Left;   PORT-A-CATH REMOVAL N/A 10/02/2017   Procedure: MINOR REMOVAL PORT-A-CATH AT BEDSIDE;  Surgeon: Donnie Mesa, MD;  Location: [place];  Service: General;  Laterality: N/A;   PORTACATH PLACEMENT  07/07/2011   Procedure: INSERTION PORT-A-CATH;  Surgeon: Scherry Ran, MD;  [facility];  Service: General;  Laterality: N/A;   PORTACATH PLACEMENT N/A 12/07/2012   Procedure: INSERTION PORT-A-CATH;  Surgeon: Scherry Ran, MD;  [facility]: [facility];  Service: General;  Laterality: N/A;  Attempted portacath placement on left and right side   URETEROSCOPY Right 12/16/2017   Procedure: DIAGNOSTIC RIGHT URETEROSCOPY;  Surgeon: Cleon Gustin, MD;  [facility];  Service: Urology;  Laterality: Right;   WOUND DEBRIDEMENT Right 04/08/2017   Procedure: EXCISION ULCERATION RIGHT GREAT TOE;  Surgeon: Caprice Beaver, DPM;  Location: [facility];  Service: Podiatry;  Laterality: Right;   WRIST SURGERY     Left; removal of bone fragment     Allergies  Allergen Reactions   Beef-Derived Products Diarrhea   Pork-Derived Products Diarrhea   Other     Band aids       Family History  Problem Relation Age of Onset    Heart disease Mother    Cancer Mother    Diabetes Father  Arthritis Other    Anesthesia problems Neg Hx    Hypotension Neg Hx    Malignant hyperthermia Neg Hx    Pseudochol deficiency Neg Hx      Social History [name] reports that he quit smoking about 19 years ago. His smoking use included cigarettes and cigars. He has a 0.25 pack-year smoking history. He has never used smokeless tobacco. [name] reports no history of alcohol use.   Review of Systems CONSTITUTIONAL: No weight loss, fever, chills, weakness or fatigue.  HEENT: Eyes: No visual loss, blurred vision, double vision or yellow sclerae.No hearing loss, sneezing, congestion, runny nose or sore throat.  SKIN: No rash or itching.  CARDIOVASCULAR: per hpi RESPIRATORY: No shortness of breath, cough or sputum.  GASTROINTESTINAL: No anorexia, nausea, vomiting or diarrhea. No abdominal pain or blood.  GENITOURINARY: No burning on urination, no polyuria NEUROLOGICAL: No headache, dizziness, syncope, paralysis, ataxia, numbness or tingling in the extremities. No change in bowel or bladder control.  MUSCULOSKELETAL: No muscle, back pain, joint pain or stiffness.  LYMPHATICS: No enlarged nodes. No history of splenectomy.  PSYCHIATRIC: No history of depression or anxiety.  ENDOCRINOLOGIC: No reports of sweating, cold or heat intolerance. No polyuria or polydipsia.  Marland Kitchen   Physical Examination Today's Vitals   12/21/20 1147  BP: (!) 162/82  Pulse: (!) 58  SpO2: 97%  Weight: (!) 388 lb (176 kg)  Height: 6' 2" (1.88 m)   Body mass index is 49.82 kg/m.  Gen: resting comfortably, no acute distress HEENT: no scleral icterus, pupils equal round and reactive, no palptable cervical adenopathy,  CV: RRR, no m/r/g no jvd Resp: Clear to auscultation bilaterally GI: abdomen is soft, non-tender, non-distended, normal bowel sounds, no hepatosplenomegaly MSK: extremities are warm, no edema.  Skin: warm, no rash Neuro:  no  focal deficits Psych: appropriate affect   Diagnostic Studies 10/2020 echo IMPRESSIONS     1. Left ventricular ejection fraction, by estimation, is approximately 30  to 35%. The left ventricle has moderately decreased function. The left  ventricle demonstrates regional wall motion abnormalities (see scoring  diagram/findings for description).  The left ventricular internal cavity size was moderately dilated. There is  mild left ventricular hypertrophy. Left ventricular diastolic parameters  are consistent with Grade II diastolic dysfunction (pseudonormalization).   2. Right ventricular systolic function is normal. The right ventricular  size is normal. Tricuspid regurgitation signal is inadequate for assessing  PA pressure.   3. Left atrial size was mildly dilated.   4. Right atrial size was mildly dilated.   5. The mitral valve is grossly normal. Mild mitral valve regurgitation.   6. The aortic valve is tricuspid. Aortic valve regurgitation is not  visualized. There is a rounded echodensity seen at the aortic valve  coaptation point in the parasternal long axis view only. Although cannot  exclude vegetation, this could be visualized   calcified left coronary leaflet tip based on other views. If endocarditis  is suspected clinically, would suggest TEE.   7. The inferior vena cava is dilated in size with >50% respiratory  variability, suggesting right atrial pressure of 8 mmHg.     Assessment and Plan  1. Chronic systolic HF - several year history of chronic systolic HF, ICM - medical therapy limited. Bradycardia on beta blockers now off. Significant renal dysfunction, on just low dose ACEI per renal. Not a candidate for farxiga given GFR - diuretics have been difficult, intermittent chronic diarrhea with fluid losses that has led  to AKI in the past on diuretics. Currently on torsemide 70m alt days with 256m- doing well symptomst wise, continue current meds     2. Aflutter -  not on av nodal agents due to prior bradycardia - given degree of recent anemia will hold xarelto for now. He denies any signs of active bleeding, ongoing anemia workup per renal and heme/onc, he does have advanced kidney disease on epogen and a history of multiple myeloma.  -slow uptrend in Hgb,may consider xarelto at f/u   3. CAD -- no symptoms, continue to monitor.      JoArnoldo LenisM.D

## 2020-12-25 DIAGNOSIS — E1142 Type 2 diabetes mellitus with diabetic polyneuropathy: Secondary | ICD-10-CM | POA: Diagnosis not present

## 2020-12-31 ENCOUNTER — Ambulatory Visit (HOSPITAL_COMMUNITY): Payer: BC Managed Care – PPO | Admitting: Hematology

## 2021-01-01 ENCOUNTER — Encounter (HOSPITAL_COMMUNITY)
Admission: RE | Admit: 2021-01-01 | Discharge: 2021-01-01 | Disposition: A | Discharge status: Home / Self Care | Payer: BC Managed Care – PPO | Source: Ambulatory Visit | Attending: Nephrology | Admitting: Nephrology

## 2021-01-01 ENCOUNTER — Other Ambulatory Visit: Payer: Self-pay

## 2021-01-01 DIAGNOSIS — D631 Anemia in chronic kidney disease: Secondary | ICD-10-CM | POA: Diagnosis not present

## 2021-01-01 DIAGNOSIS — N1831 Chronic kidney disease, stage 3a: Secondary | ICD-10-CM | POA: Diagnosis not present

## 2021-01-01 DIAGNOSIS — N184 Chronic kidney disease, stage 4 (severe): Secondary | ICD-10-CM | POA: Diagnosis not present

## 2021-01-01 DIAGNOSIS — C9001 Multiple myeloma in remission: Secondary | ICD-10-CM | POA: Diagnosis not present

## 2021-01-01 DIAGNOSIS — E1122 Type 2 diabetes mellitus with diabetic chronic kidney disease: Secondary | ICD-10-CM | POA: Diagnosis not present

## 2021-01-01 LAB — POCT HEMOGLOBIN-HEMACUE: Hemoglobin: 8.8 g/dL — ABNORMAL LOW (ref 13.0–17.0)

## 2021-01-01 MED ORDER — EPOETIN ALFA-EPBX 10000 UNIT/ML IJ SOLN
INTRAMUSCULAR | Status: AC
  Filled 2021-01-01: qty 1

## 2021-01-01 MED ORDER — EPOETIN ALFA-EPBX 10000 UNIT/ML IJ SOLN
10000.0000 [IU] | Freq: Once | INTRAMUSCULAR | Status: AC
  Administered 2021-01-01: 10000 [IU] via SUBCUTANEOUS

## 2021-01-02 ENCOUNTER — Other Ambulatory Visit (HOSPITAL_COMMUNITY): Payer: Self-pay

## 2021-01-02 DIAGNOSIS — D649 Anemia, unspecified: Secondary | ICD-10-CM

## 2021-01-02 DIAGNOSIS — C9000 Multiple myeloma not having achieved remission: Secondary | ICD-10-CM

## 2021-01-07 NOTE — Progress Notes (Signed)
[name], [name] [id number]   CLINIC:  Medical Oncology/Hematology  PCP:  [name], [address] / [place] [address]  [phone]  REASON FOR VISIT:   Follow-up for multiple myeloma  CURRENT THERAPY: Observation  INTERVAL HISTORY:  [name], a 60 y.o. male, returns for routine follow-up for his multiple myeloma. He was last seen by Dr. Chryl Waller on 11/29/2020.  On exam today [name] is accompanied by his wife.  He reports that he has felt well since he increased the dose in his Depo shots.  He notes that he is having much better energy and that his shortness of breath has decreased.  He denies having any overt signs or symptoms concerning for VTE.  He also has not noticed any overt signs of bleeding, bruising, or dark stools.  A full 10 point ROS is listed below.  PAST MEDICAL/SURGICAL HISTORY:  Past Medical History:  Diagnosis Date   Abscess 04/2017   Scrotal   Anemia    Arteriosclerotic cardiovascular disease (ASCVD)    MI-2000s; stent to the proximal LAD and diagonal in 2001; stress nuclear in 2008-impaired exercise capacity, left ventricular dilatation, moderately to severely depressed EF, apical, inferior and anteroseptal scar   Arthritis    Atrial flutter (HCC)    Bence-Jones proteinuria 05/05/2011   Cellulitis of leg    both legs   Chronic diarrhea    Chronic kidney disease, stage 3, mod decreased GFR (HCC)    Creatinine of 1.84 in 06/2011 and 1.5 in 07/2011   Diabetes mellitus    Insulin   Dysrhythmia    AFlutter   GERD (gastroesophageal reflux disease)    Gout    Hyperlipidemia    Hypertension    Injection site reaction    Multiple myeloma 07/01/2011   Myocardial infarction ([name]) 2000   Obesity    Pedal edema    Venous insufficiency   Sleep apnea    uses cpap   Ulcer    Past Surgical History:  Procedure Laterality Date   ABSCESS DRAINAGE     Scrotal   BIOPSY  01/02/2012   Procedure:  BIOPSY;  Surgeon: Rogene Houston, MD;  [facility];  Service: Endoscopy;  Laterality: N/A;   BONE MARROW BIOPSY  05/13/11   CARDIAC CATHETERIZATION     cardiac stent   CARDIOVERSION N/A 10/13/2012   Procedure: CARDIOVERSION;  Surgeon: Yehuda Savannah, MD;  Location: [facility];  Service: Cardiovascular;  Laterality: N/A;   CATARACT EXTRACTION W/PHACO Left 02/13/2014   Procedure: CATARACT EXTRACTION PHACO AND INTRAOCULAR LENS PLACEMENT ([place]);  Surgeon: Tonny Branch, MD;  Location: [facility];  Service: Ophthalmology;  Laterality: Left;  CDE:  7.67   CATARACT EXTRACTION W/PHACO Right 03/02/2014   Procedure: CATARACT EXTRACTION PHACO AND INTRAOCULAR LENS PLACEMENT RIGHT EYE CDE=16.81;  Surgeon: Tonny Branch, MD;  Location: [facility];  Service: Ophthalmology;  Laterality: Right;   COLONOSCOPY  11/28/2011   Procedure: COLONOSCOPY;  Surgeon: Rogene Houston, MD;  [facility];  Service: Endoscopy;  Laterality: N/A;  Fisher Right 12/16/2017   Procedure: CYSTOSCOPY WITH RIGHT RETROGRADE PYELOGRAM AND RIGHT URETERAL STENT REMOVAL;  Surgeon: Cleon Gustin, MD;  Location: [facility];  Service: Urology;  Laterality: Right;   CYSTOSCOPY W/ URETERAL STENT PLACEMENT Right 07/31/2017   Procedure: CYSTOSCOPY WITH RETROGRADE PYELOGRAM/URETERAL STENT PLACEMENT;  Surgeon: Cleon Gustin, MD;  Location:  [facility];  Service: Urology;  Laterality: Right;   ESOPHAGOGASTRODUODENOSCOPY  01/02/2012   Procedure: ESOPHAGOGASTRODUODENOSCOPY (EGD);  Surgeon: Rogene Houston, MD;  [facility];  Service: Endoscopy;  Laterality: N/A;  100   ESOPHAGOGASTRODUODENOSCOPY N/A 09/20/2012   Procedure: ESOPHAGOGASTRODUODENOSCOPY (EGD);  Surgeon: Rogene Houston, MD;  [facility];  Service: Endoscopy;  Laterality: N/A;   EUS N/A 10/07/2012   Procedure: UPPER ENDOSCOPIC ULTRASOUND (EUS) LINEAR;  Surgeon: Milus Banister, MD;  [facility];  Service: Endoscopy;  Laterality: N/A;    INCISION AND DRAINAGE ABSCESS N/A 04/14/2017   Procedure: INCISION AND DRAINAGE ABSCESS;  Surgeon: Ceasar Mons, MD;  [facility];  Service: Urology;  Laterality: N/A;   INCISION AND DRAINAGE ABSCESS ANAL     IRRIGATION AND DEBRIDEMENT ABSCESS N/A 04/15/2017   Procedure: DEBRIDEMENT SCROTAL WOUND AND DRESSING CHANGE;  Surgeon: Ceasar Mons, MD;  [facility];  Service: Urology;  Laterality: N/A;   IRRIGATION AND DEBRIDEMENT ABSCESS N/A 04/17/2017   Procedure: IRRIGATION AND DEBRIDEMENT ABSCESS;  Surgeon: Ceasar Mons, MD;  [facility];  Service: Urology;  Laterality: N/A;  RM 3   LAPAROSCOPIC GASTRIC BANDING  2006   has been removed   OSTECTOMY Right 04/08/2017   Procedure: OSTECTOMY RIGHT GREAT TOE;  Surgeon: Caprice Beaver, DPM;  [facility]: [facility];  Service: Podiatry;  Laterality: Right;   PORT-A-CATH REMOVAL Left 12/07/2012   Procedure: REMOVAL PORT-A-CATH;  Surgeon: Scherry Ran, MD;  [facility];  Service: General;  Laterality: Left;   PORT-A-CATH REMOVAL N/A 10/02/2017   Procedure: MINOR REMOVAL PORT-A-CATH AT BEDSIDE;  Surgeon: Donnie Mesa, MD;  Location: [facility];  Service: General;  Laterality: N/A;   PORTACATH PLACEMENT  07/07/2011   Procedure: INSERTION PORT-A-CATH;  Surgeon: Scherry Ran, MD;  [facility];  Service: General;  Laterality: N/A;   PORTACATH PLACEMENT N/A 12/07/2012   Procedure: INSERTION PORT-A-CATH;  Surgeon: Scherry Ran, MD;  [facility]: [facility];  Service: General;  Laterality: N/A;  Attempted portacath placement on left and right side   URETEROSCOPY Right 12/16/2017   Procedure: DIAGNOSTIC RIGHT URETEROSCOPY;  Surgeon: Cleon Gustin, MD;  [facility];  Service: Urology;  Laterality: Right;   WOUND DEBRIDEMENT Right 04/08/2017   Procedure: EXCISION ULCERATION RIGHT GREAT TOE;  Surgeon: Caprice Beaver, DPM;  Location: [facility];  Service: Podiatry;  Laterality: Right;   WRIST  SURGERY     Left; removal of bone fragment    SOCIAL HISTORY:  Social History   Socioeconomic History   Marital status: Married    Spouse name: Not on file   Number of children: Not on file   Years of education: 16   Highest education level: Not on file  Occupational History   Occupation: Nurse, learning disability   Occupation: Optometrist: [employer]  Tobacco Use   Smoking status: Former    Packs/day: 0.25    Years: 1.00    Pack years: 0.25    Types: Cigarettes, Cigars    Quit date: 05/17/2001    Years since quitting: 19.6   Smokeless tobacco: Never  Vaping Use   Vaping Use: Never used  Substance and Sexual Activity   Alcohol use: No    Alcohol/week: 0.0 standard drinks   Drug use: No   Sexual activity: Yes    Birth control/protection: None  Other Topics Concern   Not on file  Social History Narrative  Not on file   Social Determinants of Health   Financial Resource Strain: Not on file  Food Insecurity: Not on file  Transportation Needs: Not on file  Physical Activity: Not on file  Stress: Not on file  Social Connections: Not on file  Intimate Partner Violence: Not on file    FAMILY HISTORY:  Family History  Problem Relation Age of Onset   Heart disease Mother    Cancer Mother    Diabetes Father    Arthritis Other    Anesthesia problems Neg Hx    Hypotension Neg Hx    Malignant hyperthermia Neg Hx    Pseudochol deficiency Neg Hx     CURRENT MEDICATIONS:  Current Outpatient Medications  Medication Sig Dispense Refill   acetaminophen (TYLENOL) 500 MG tablet Take 500-1,000 mg by mouth every 6 (six) hours as needed for mild pain or moderate pain.     acyclovir (ZOVIRAX) 400 MG tablet Take 1 tablet (400 mg total) by mouth every morning. 30 tablet 5   allopurinol (ZYLOPRIM) 300 MG tablet TAKE ONE TABLET BY MOUTH ONCE DAILY. 30 tablet 2   Artificial Tear Ointment (DRY EYES OP) Place 1 drop into both eyes as needed (dry eyes).      atorvastatin (LIPITOR) 80 MG tablet TAKE ONE TABLET BY MOUTH AT BEDTIME. 90 tablet 3   Cholecalciferol (D2000 ULTRA STRENGTH) 50 MCG (2000 UT) CAPS Take by mouth.     CONTOUR NEXT TEST test strip      cyclobenzaprine (FLEXERIL) 5 MG tablet Take by mouth.     dicyclomine (BENTYL) 20 MG tablet TAKE 1 TABLET BY MOUTH BEFORE MEALS 3 TIMES DAILY. 90 tablet 2   Eluxadoline 100 MG TABS Take 100 mg by mouth 2 (two) times daily with a meal.     epoetin alfa (EPOGEN) 3000 UNIT/ML injection Inject into the skin.     ferrous sulfate 325 (65 FE) MG EC tablet Take 45 mg by mouth. Every other day     gabapentin (NEURONTIN) 600 MG tablet TAKE 1 TABLET BY MOUTH ONCE A DAY. 30 tablet 0   hydrALAZINE (APRESOLINE) 100 MG tablet Take 1 tablet (100 mg total) by mouth 3 (three) times daily. 270 tablet 3   insulin aspart (NOVOLOG) 100 UNIT/ML injection Inject into the skin. Sliding scale over 150= 1 unit, 200= 2 units,250= 3 units     insulin detemir (LEVEMIR) 100 UNIT/ML injection Inject 0.1 mLs (10 Units total) into the skin every morning. 10 mL 11   INSULIN SYRINGE .5CC/29G 29G X 1/2" 0.5 ML MISC      lisinopril (ZESTRIL) 5 MG tablet Take 5 mg by mouth daily.     loratadine (CLARITIN) 10 MG tablet Take 10 mg by mouth every morning.     Magnesium Cl-Calcium Carbonate (SLOW-MAG PO) Take 1 tablet by mouth every morning.     Microlet Lancets MISC      Multiple Vitamins-Minerals (MULTIVITAMINS THER. W/MINERALS) TABS Take 1 tablet by mouth daily.     nitroGLYCERIN (NITROSTAT) 0.4 MG SL tablet DISSOLVE 1 TABLET UNDER TONGUE EVERY 5 MINUTES UP TO 15 MIN FOR CHESTPAIN. IF NO RELIEF CALL 911. 25 tablet 0   pantoprazole (PROTONIX) 40 MG tablet Take 40 mg by mouth daily.     potassium chloride SA (KLOR-CON) 20 MEQ tablet Take 20 mEq by mouth daily. Every other day     sodium bicarbonate 650 MG tablet Take 650 mg by mouth 3 (three) times daily.     SURE  COMFORT PEN NEEDLES 29G X 12.7MM MISC USE AS DIRECTED FOURCTIMES DAILY.      torsemide (DEMADEX) 20 MG tablet Take 40 mg by mouth every other day alternating with 20 mg     isosorbide mononitrate (IMDUR) 30 MG 24 hr tablet Take 0.5 tablets (15 mg total) by mouth daily. 45 tablet 3   No current facility-administered medications for this visit.   Facility-Administered Medications Ordered in Other Visits  Medication Dose Route Frequency Provider Last Rate Last Admin   sodium chloride 0.9 % injection 10 mL  10 mL Intravenous Once Farrel Gobble, MD        ALLERGIES:  Allergies  Allergen Reactions   Beef-Derived Products Diarrhea   Pork-Derived Products Diarrhea   Other     Band aids     PHYSICAL EXAM:  Performance status (ECOG): 1 - Symptomatic but completely ambulatory  Vitals:   01/08/21 1107  BP: (!) 122/55  Pulse: (!) 52  Resp: 18  Temp: (!) 96.9 F (36.1 C)  SpO2: 99%   Wt Readings from Last 3 Encounters:  01/08/21 (!) 385 lb 6.4 oz (174.8 kg)  12/21/20 (!) 388 lb (176 kg)  12/19/20 (!) 390 lb 3.2 oz (177 kg)   Physical Exam Vitals reviewed.  Constitutional:      Appearance: Normal appearance. He is obese.  HENT:     Mouth/Throat:     Mouth: Mucous membranes are moist.  Eyes:     Pupils: Pupils are equal, round, and reactive to light.  Cardiovascular:     Rate and Rhythm: Normal rate and regular rhythm.     Pulses: Normal pulses.     Heart sounds: Normal heart sounds.  Pulmonary:     Effort: Pulmonary effort is normal.     Breath sounds: Normal breath sounds.  Abdominal:     Palpations: Abdomen is soft. There is no mass.     Tenderness: There is no abdominal tenderness.  Musculoskeletal:        General: Swelling (compression socks on) present.     Right lower leg: No edema.     Left lower leg: No edema.  Neurological:     Mental Status: He is alert and oriented to person, place, and time.  Psychiatric:        Mood and Affect: Mood normal.        Behavior: Behavior normal.    LABORATORY DATA:  I have reviewed the labs  as listed.  CBC Latest Ref Rng & Units 01/01/2021 12/18/2020 12/11/2020  WBC 4.0 - 10.5 K/uL - - -  Hemoglobin 13.0 - 17.0 g/dL 8.8(L) 8.9(L) 8.2(L)  Hematocrit 39.0 - 52.0 % - - -  Platelets 150 - 400 K/uL - - -   CMP Latest Ref Rng & Units 12/13/2019 06/10/2019 02/01/2019  Glucose 70 - 99 mg/dL 131(H) 157(H) 144(H)  BUN 6 - 20 mg/dL 75(H) 40(H) 55(H)  Creatinine 0.61 - 1.24 mg/dL 2.78(H) 2.26(H) 2.38(H)  Sodium 135 - 145 mmol/L 140 142 139  Potassium 3.5 - 5.1 mmol/L 4.6 4.4 3.8  Chloride 98 - 111 mmol/L 107 113(H) 112(H)  CO2 22 - 32 mmol/L 22 23 19(L)  Calcium 8.9 - 10.3 mg/dL 8.8(L) 8.3(L) 8.5(L)  Total Protein 6.5 - 8.1 g/dL 7.3 6.5 6.7  Total Bilirubin 0.3 - 1.2 mg/dL 0.5 0.6 0.9  Alkaline Phos 38 - 126 U/L 115 101 106  AST 15 - 41 U/L 31 27 41  ALT 0 - 44 U/L 33 27 39  Component Value Date/Time   RBC 3.75 (L) 12/13/2019 1434   MCV 99.7 12/13/2019 1434   MCH 30.9 12/13/2019 1434   MCHC 31.0 12/13/2019 1434   RDW 14.6 12/13/2019 1434   LYMPHSABS 2.3 12/13/2019 1434   MONOABS 0.6 12/13/2019 1434   EOSABS 0.2 12/13/2019 1434   BASOSABS 0.0 12/13/2019 1434    DIAGNOSTIC IMAGING:  I have independently reviewed the scans and discussed with the patient. No results found.    ASSESSMENT:  1.  Multiple myeloma: -Immunofixation has been negative since 2017. Recent labs, no M protein, normal K/L ratio, normal immunoglobulins. No concern for recurrence on last labs from August 2022 Can continue to monitor.   2.Normocytic normochromic anemia --Hgb improved to 10.0 today from 8.8 at last visit.  --Likely from worsening CKD --No overt iron def with ferritin 105 on 01/03/2021.  --Continue Epo dose 10000 units every other week --Discussed increased risk of DVT/PE with epo. --recommend repeat labs in 4 weeks time at [facility] --FU with Dr Raliegh Ip in 8 weeks to assure anemia is improved/stable.  All questions were answered. The patient knows to call the clinic with any problems, questions  or concerns.  A total of more than 30 minutes were spent on this encounter with face-to-face time and non-face-to-face time, including preparing to see the patient, ordering tests and/or medications, counseling the patient and coordination of care as outlined above.    Ledell Peoples, MD Department of Hematology/Oncology [place] [facility] Phone: [phone] Pager: [phone] Email: [email or web address]

## 2021-01-08 ENCOUNTER — Other Ambulatory Visit: Payer: Self-pay

## 2021-01-08 ENCOUNTER — Inpatient Hospital Stay (HOSPITAL_COMMUNITY): Payer: BC Managed Care – PPO | Attending: Hematology and Oncology | Admitting: Hematology and Oncology

## 2021-01-08 VITALS — BP 122/55 | HR 52 | Temp 96.9°F | Resp 18 | Wt 385.4 lb

## 2021-01-08 DIAGNOSIS — I872 Venous insufficiency (chronic) (peripheral): Secondary | ICD-10-CM | POA: Diagnosis not present

## 2021-01-08 DIAGNOSIS — E669 Obesity, unspecified: Secondary | ICD-10-CM | POA: Insufficient documentation

## 2021-01-08 DIAGNOSIS — K219 Gastro-esophageal reflux disease without esophagitis: Secondary | ICD-10-CM | POA: Diagnosis not present

## 2021-01-08 DIAGNOSIS — C9000 Multiple myeloma not having achieved remission: Secondary | ICD-10-CM | POA: Insufficient documentation

## 2021-01-08 DIAGNOSIS — E119 Type 2 diabetes mellitus without complications: Secondary | ICD-10-CM | POA: Insufficient documentation

## 2021-01-08 DIAGNOSIS — Z79899 Other long term (current) drug therapy: Secondary | ICD-10-CM | POA: Insufficient documentation

## 2021-01-08 DIAGNOSIS — E1142 Type 2 diabetes mellitus with diabetic polyneuropathy: Secondary | ICD-10-CM | POA: Diagnosis not present

## 2021-01-08 DIAGNOSIS — G4730 Sleep apnea, unspecified: Secondary | ICD-10-CM | POA: Diagnosis not present

## 2021-01-08 DIAGNOSIS — Z794 Long term (current) use of insulin: Secondary | ICD-10-CM | POA: Diagnosis not present

## 2021-01-08 DIAGNOSIS — M109 Gout, unspecified: Secondary | ICD-10-CM | POA: Insufficient documentation

## 2021-01-08 DIAGNOSIS — I10 Essential (primary) hypertension: Secondary | ICD-10-CM | POA: Insufficient documentation

## 2021-01-08 DIAGNOSIS — D649 Anemia, unspecified: Secondary | ICD-10-CM | POA: Diagnosis not present

## 2021-01-08 DIAGNOSIS — E785 Hyperlipidemia, unspecified: Secondary | ICD-10-CM | POA: Insufficient documentation

## 2021-01-08 DIAGNOSIS — N183 Chronic kidney disease, stage 3 unspecified: Secondary | ICD-10-CM | POA: Diagnosis not present

## 2021-01-08 DIAGNOSIS — I2510 Atherosclerotic heart disease of native coronary artery without angina pectoris: Secondary | ICD-10-CM | POA: Diagnosis not present

## 2021-01-08 DIAGNOSIS — I252 Old myocardial infarction: Secondary | ICD-10-CM | POA: Diagnosis not present

## 2021-01-08 DIAGNOSIS — M129 Arthropathy, unspecified: Secondary | ICD-10-CM | POA: Diagnosis not present

## 2021-01-10 ENCOUNTER — Other Ambulatory Visit (HOSPITAL_COMMUNITY): Payer: Self-pay

## 2021-01-10 DIAGNOSIS — D649 Anemia, unspecified: Secondary | ICD-10-CM

## 2021-01-10 DIAGNOSIS — C9000 Multiple myeloma not having achieved remission: Secondary | ICD-10-CM

## 2021-01-15 ENCOUNTER — Encounter (HOSPITAL_COMMUNITY)
Admission: RE | Admit: 2021-01-15 | Discharge: 2021-01-15 | Disposition: A | Discharge status: Home / Self Care | Payer: BC Managed Care – PPO | Source: Ambulatory Visit | Attending: Nephrology | Admitting: Nephrology

## 2021-01-15 ENCOUNTER — Other Ambulatory Visit: Payer: Self-pay

## 2021-01-15 DIAGNOSIS — D631 Anemia in chronic kidney disease: Secondary | ICD-10-CM | POA: Diagnosis not present

## 2021-01-15 DIAGNOSIS — N184 Chronic kidney disease, stage 4 (severe): Secondary | ICD-10-CM | POA: Diagnosis not present

## 2021-01-15 LAB — POCT HEMOGLOBIN-HEMACUE: Hemoglobin: 9.1 g/dL — ABNORMAL LOW (ref 13.0–17.0)

## 2021-01-15 MED ORDER — EPOETIN ALFA-EPBX 10000 UNIT/ML IJ SOLN
10000.0000 [IU] | Freq: Once | INTRAMUSCULAR | Status: AC
  Administered 2021-01-15: 10000 [IU] via SUBCUTANEOUS

## 2021-01-15 MED ORDER — EPOETIN ALFA-EPBX 10000 UNIT/ML IJ SOLN
INTRAMUSCULAR | Status: AC
  Filled 2021-01-15: qty 1

## 2021-01-21 DIAGNOSIS — G4733 Obstructive sleep apnea (adult) (pediatric): Secondary | ICD-10-CM | POA: Diagnosis not present

## 2021-01-29 ENCOUNTER — Other Ambulatory Visit (HOSPITAL_COMMUNITY): Payer: BC Managed Care – PPO

## 2021-01-29 ENCOUNTER — Encounter (HOSPITAL_COMMUNITY)
Admission: RE | Admit: 2021-01-29 | Discharge: 2021-01-29 | Disposition: A | Discharge status: Home / Self Care | Payer: BC Managed Care – PPO | Source: Ambulatory Visit | Attending: Nephrology | Admitting: Nephrology

## 2021-01-29 ENCOUNTER — Other Ambulatory Visit: Payer: Self-pay

## 2021-01-29 ENCOUNTER — Ambulatory Visit (HOSPITAL_COMMUNITY): Payer: BC Managed Care – PPO

## 2021-01-29 DIAGNOSIS — N184 Chronic kidney disease, stage 4 (severe): Secondary | ICD-10-CM | POA: Diagnosis not present

## 2021-01-29 DIAGNOSIS — D631 Anemia in chronic kidney disease: Secondary | ICD-10-CM | POA: Diagnosis not present

## 2021-01-29 LAB — POCT HEMOGLOBIN-HEMACUE: Hemoglobin: 9.8 g/dL — ABNORMAL LOW (ref 13.0–17.0)

## 2021-01-29 MED ORDER — EPOETIN ALFA-EPBX 10000 UNIT/ML IJ SOLN
10000.0000 [IU] | INTRAMUSCULAR | Status: DC
  Administered 2021-01-29: 10000 [IU] via SUBCUTANEOUS

## 2021-01-29 MED ORDER — EPOETIN ALFA-EPBX 10000 UNIT/ML IJ SOLN
INTRAMUSCULAR | Status: AC
  Filled 2021-01-29: qty 1

## 2021-02-05 DIAGNOSIS — E1142 Type 2 diabetes mellitus with diabetic polyneuropathy: Secondary | ICD-10-CM | POA: Diagnosis not present

## 2021-02-12 ENCOUNTER — Encounter (HOSPITAL_COMMUNITY)
Admission: RE | Admit: 2021-02-12 | Discharge: 2021-02-12 | Disposition: A | Discharge status: Home / Self Care | Payer: BC Managed Care – PPO | Source: Ambulatory Visit | Attending: Nephrology | Admitting: Nephrology

## 2021-02-12 ENCOUNTER — Other Ambulatory Visit (HOSPITAL_COMMUNITY): Payer: BC Managed Care – PPO

## 2021-02-12 ENCOUNTER — Ambulatory Visit (HOSPITAL_COMMUNITY): Payer: BC Managed Care – PPO

## 2021-02-12 DIAGNOSIS — N184 Chronic kidney disease, stage 4 (severe): Secondary | ICD-10-CM | POA: Insufficient documentation

## 2021-02-12 DIAGNOSIS — D631 Anemia in chronic kidney disease: Secondary | ICD-10-CM | POA: Insufficient documentation

## 2021-02-18 DIAGNOSIS — N189 Chronic kidney disease, unspecified: Secondary | ICD-10-CM | POA: Diagnosis not present

## 2021-02-18 DIAGNOSIS — E1122 Type 2 diabetes mellitus with diabetic chronic kidney disease: Secondary | ICD-10-CM | POA: Diagnosis not present

## 2021-02-18 DIAGNOSIS — R809 Proteinuria, unspecified: Secondary | ICD-10-CM | POA: Diagnosis not present

## 2021-02-18 DIAGNOSIS — D638 Anemia in other chronic diseases classified elsewhere: Secondary | ICD-10-CM | POA: Diagnosis not present

## 2021-02-18 DIAGNOSIS — E1129 Type 2 diabetes mellitus with other diabetic kidney complication: Secondary | ICD-10-CM | POA: Diagnosis not present

## 2021-02-19 DIAGNOSIS — E1142 Type 2 diabetes mellitus with diabetic polyneuropathy: Secondary | ICD-10-CM | POA: Diagnosis not present

## 2021-02-19 DIAGNOSIS — B353 Tinea pedis: Secondary | ICD-10-CM | POA: Diagnosis not present

## 2021-02-20 DIAGNOSIS — N170 Acute kidney failure with tubular necrosis: Secondary | ICD-10-CM | POA: Diagnosis not present

## 2021-02-20 DIAGNOSIS — D638 Anemia in other chronic diseases classified elsewhere: Secondary | ICD-10-CM | POA: Diagnosis not present

## 2021-02-20 DIAGNOSIS — N189 Chronic kidney disease, unspecified: Secondary | ICD-10-CM | POA: Diagnosis not present

## 2021-02-20 DIAGNOSIS — R809 Proteinuria, unspecified: Secondary | ICD-10-CM | POA: Diagnosis not present

## 2021-02-21 DIAGNOSIS — G4733 Obstructive sleep apnea (adult) (pediatric): Secondary | ICD-10-CM | POA: Diagnosis not present

## 2021-02-25 DIAGNOSIS — N183 Chronic kidney disease, stage 3 unspecified: Secondary | ICD-10-CM | POA: Diagnosis not present

## 2021-02-25 DIAGNOSIS — S81801A Unspecified open wound, right lower leg, initial encounter: Secondary | ICD-10-CM | POA: Diagnosis not present

## 2021-02-25 DIAGNOSIS — I5022 Chronic systolic (congestive) heart failure: Secondary | ICD-10-CM | POA: Diagnosis not present

## 2021-02-25 DIAGNOSIS — E1122 Type 2 diabetes mellitus with diabetic chronic kidney disease: Secondary | ICD-10-CM | POA: Diagnosis not present

## 2021-02-26 ENCOUNTER — Ambulatory Visit (HOSPITAL_COMMUNITY): Payer: BC Managed Care – PPO

## 2021-02-26 ENCOUNTER — Other Ambulatory Visit: Payer: Self-pay

## 2021-02-26 ENCOUNTER — Other Ambulatory Visit (HOSPITAL_COMMUNITY): Payer: BC Managed Care – PPO

## 2021-02-26 ENCOUNTER — Ambulatory Visit (HOSPITAL_COMMUNITY): Payer: BC Managed Care – PPO | Admitting: Hematology

## 2021-02-26 ENCOUNTER — Encounter (HOSPITAL_COMMUNITY)
Admission: RE | Admit: 2021-02-26 | Discharge: 2021-02-26 | Disposition: A | Discharge status: Home / Self Care | Payer: BC Managed Care – PPO | Source: Ambulatory Visit | Attending: Nephrology | Admitting: Nephrology

## 2021-02-26 DIAGNOSIS — D631 Anemia in chronic kidney disease: Secondary | ICD-10-CM | POA: Diagnosis not present

## 2021-02-26 DIAGNOSIS — N184 Chronic kidney disease, stage 4 (severe): Secondary | ICD-10-CM | POA: Diagnosis not present

## 2021-02-26 LAB — POCT HEMOGLOBIN-HEMACUE: Hemoglobin: 9.4 g/dL — ABNORMAL LOW (ref 13.0–17.0)

## 2021-02-26 MED ORDER — EPOETIN ALFA-EPBX 10000 UNIT/ML IJ SOLN
INTRAMUSCULAR | Status: AC
  Filled 2021-02-26: qty 1

## 2021-02-26 MED ORDER — EPOETIN ALFA-EPBX 10000 UNIT/ML IJ SOLN
10000.0000 [IU] | Freq: Once | INTRAMUSCULAR | Status: AC
Start: 2021-02-26 — End: 2021-02-26
  Administered 2021-02-26: 10000 [IU] via SUBCUTANEOUS

## 2021-02-28 ENCOUNTER — Ambulatory Visit (HOSPITAL_COMMUNITY): Payer: BC Managed Care – PPO | Admitting: Hematology

## 2021-03-02 NOTE — Progress Notes (Signed)
[place] [place], [place] [address]   CLINIC:  Medical Oncology/Hematology  PCP:  [name], [address] / [place] [address] [phone]   REASON FOR VISIT:  Follow-up for multiple myeloma  PRIOR THERAPY: none  NGS Results: not done  CURRENT THERAPY: surveillannce  BRIEF ONCOLOGIC HISTORY:  Oncology History   No history exists.    CANCER STAGING: Cancer Staging No matching staging information was found for the patient.  INTERVAL HISTORY:  [name], a 60 y.o. male, returns for routine follow-up of his multiple myeloma. [name] was last seen on 12/28/2019.   Today he reports feeling good. He denies any new pains, and within the past 6 months he reports a fungal infection on his toes which has resolved. He denies hematochezia and black stools. He reports stable numbness in his feet.  REVIEW OF SYSTEMS:  Review of Systems  Constitutional:  Negative for appetite change and fatigue (80%).  Gastrointestinal:  Negative for blood in stool.  Neurological:  Positive for numbness (stable feet).  All other systems reviewed and are negative.  PAST MEDICAL/SURGICAL HISTORY:  Past Medical History:  Diagnosis Date   Abscess 04/2017   Scrotal   Anemia    Arteriosclerotic cardiovascular disease (ASCVD)    MI-2000s; stent to the proximal LAD and diagonal in 2001; stress nuclear in 2008-impaired exercise capacity, left ventricular dilatation, moderately to severely depressed EF, apical, inferior and anteroseptal scar   Arthritis    Atrial flutter (HCC)    Bence-Jones proteinuria 05/05/2011   Cellulitis of leg    both legs   Chronic diarrhea    Chronic kidney disease, stage 3, mod decreased GFR (HCC)    Creatinine of 1.84 in 06/2011 and 1.5 in 07/2011   Diabetes mellitus    Insulin   Dysrhythmia    AFlutter   GERD (gastroesophageal reflux disease)    Gout    Hyperlipidemia    Hypertension    Injection site reaction     Multiple myeloma 07/01/2011   Myocardial infarction ([place]) 2000   Obesity    Pedal edema    Venous insufficiency   Sleep apnea    uses cpap   Ulcer    Past Surgical History:  Procedure Laterality Date   ABSCESS DRAINAGE     Scrotal   BIOPSY  01/02/2012   Procedure: BIOPSY;  Surgeon: Rogene Houston, MD;  [facility];  Service: Endoscopy;  Laterality: N/A;   BONE MARROW BIOPSY  05/13/11   CARDIAC CATHETERIZATION     cardiac stent   CARDIOVERSION N/A 10/13/2012   Procedure: CARDIOVERSION;  Surgeon: Yehuda Savannah, MD;  Location: [facility];  Service: Cardiovascular;  Laterality: N/A;   CATARACT EXTRACTION W/PHACO Left 02/13/2014   Procedure: CATARACT EXTRACTION PHACO AND INTRAOCULAR LENS PLACEMENT ([place]);  Surgeon: Tonny Branch, MD;  Location: [facility];  Service: Ophthalmology;  Laterality: Left;  CDE:  7.67   CATARACT EXTRACTION W/PHACO Right 03/02/2014   Procedure: CATARACT EXTRACTION PHACO AND INTRAOCULAR LENS PLACEMENT RIGHT EYE CDE=16.81;  Surgeon: Tonny Branch, MD;  Location: [facility];  Service: Ophthalmology;  Laterality: Right;   COLONOSCOPY  11/28/2011   Procedure: COLONOSCOPY;  Surgeon: Rogene Houston, MD;  [facility];  Service: Endoscopy;  Laterality: N/A;  [place] Right 12/16/2017   Procedure: CYSTOSCOPY WITH RIGHT RETROGRADE PYELOGRAM AND RIGHT URETERAL STENT REMOVAL;  Surgeon: Cleon Gustin, MD;  Location: [facility];  Service: Urology;  Laterality: Right;   CYSTOSCOPY W/ URETERAL STENT PLACEMENT Right 07/31/2017   Procedure: CYSTOSCOPY WITH RETROGRADE PYELOGRAM/URETERAL STENT PLACEMENT;  Surgeon: Cleon Gustin, MD;  [facility];  Service: Urology;  Laterality: Right;   ESOPHAGOGASTRODUODENOSCOPY  01/02/2012   Procedure: ESOPHAGOGASTRODUODENOSCOPY (EGD);  Surgeon: Rogene Houston, MD;  [facility];  Service: Endoscopy;  Laterality: N/A;  100   ESOPHAGOGASTRODUODENOSCOPY N/A 09/20/2012   Procedure:  ESOPHAGOGASTRODUODENOSCOPY (EGD);  Surgeon: Rogene Houston, MD;  [facility];  Service: Endoscopy;  Laterality: N/A;   EUS N/A 10/07/2012   Procedure: UPPER ENDOSCOPIC ULTRASOUND (EUS) LINEAR;  Surgeon: Milus Banister, MD;  [facility];  Service: Endoscopy;  Laterality: N/A;   INCISION AND DRAINAGE ABSCESS N/A 04/14/2017   Procedure: INCISION AND DRAINAGE ABSCESS;  Surgeon: Ceasar Mons, MD;  [facility];  Service: Urology;  Laterality: N/A;   INCISION AND DRAINAGE ABSCESS ANAL     IRRIGATION AND DEBRIDEMENT ABSCESS N/A 04/15/2017   Procedure: DEBRIDEMENT SCROTAL WOUND AND DRESSING CHANGE;  Surgeon: Ceasar Mons, MD;  [facility];  Service: Urology;  Laterality: N/A;   IRRIGATION AND DEBRIDEMENT ABSCESS N/A 04/17/2017   Procedure: IRRIGATION AND DEBRIDEMENT ABSCESS;  Surgeon: Ceasar Mons, MD;  [facility];  Service: Urology;  Laterality: N/A;  RM 3   LAPAROSCOPIC GASTRIC BANDING  2006   has been removed   OSTECTOMY Right 04/08/2017   Procedure: OSTECTOMY RIGHT GREAT TOE;  Surgeon: Caprice Beaver, DPM;  [facility];  Service: Podiatry;  Laterality: Right;   PORT-A-CATH REMOVAL Left 12/07/2012   Procedure: REMOVAL PORT-A-CATH;  Surgeon: Scherry Ran, MD;  Location: [facility];  Service: General;  Laterality: Left;   PORT-A-CATH REMOVAL N/A 10/02/2017   Procedure: MINOR REMOVAL PORT-A-CATH AT BEDSIDE;  Surgeon: Donnie Mesa, MD;  Location: [facility];  Service: General;  Laterality: N/A;   PORTACATH PLACEMENT  07/07/2011   Procedure: INSERTION PORT-A-CATH;  Surgeon: Scherry Ran, MD;  [facility];  Service: General;  Laterality: N/A;   PORTACATH PLACEMENT N/A 12/07/2012   Procedure: INSERTION PORT-A-CATH;  Surgeon: Scherry Ran, MD;  [facility]: [facility];  Service: General;  Laterality: N/A;  Attempted portacath placement on left and right side   URETEROSCOPY Right 12/16/2017   Procedure: DIAGNOSTIC RIGHT  URETEROSCOPY;  Surgeon: Cleon Gustin, MD;  [facility];  Service: Urology;  Laterality: Right;   WOUND DEBRIDEMENT Right 04/08/2017   Procedure: EXCISION ULCERATION RIGHT GREAT TOE;  Surgeon: Caprice Beaver, DPM;  Location: [facility];  Service: Podiatry;  Laterality: Right;   WRIST SURGERY     Left; removal of bone fragment    SOCIAL HISTORY:  Social History   Socioeconomic History   Marital status: Married    Spouse name: Not on file   Number of children: Not on file   Years of education: 16   Highest education level: Not on file  Occupational History   Occupation: Nurse, learning disability   Occupation: Optometrist: [employer]  Tobacco Use   Smoking status: Former    Packs/day: 0.25    Years: 1.00    Pack years: 0.25    Types: Cigarettes, Cigars    Quit date: 05/17/2001    Years since quitting: 19.8   Smokeless tobacco: Never  Vaping Use   Vaping Use: Never used  Substance and Sexual Activity   Alcohol use: No  Alcohol/week: 0.0 standard drinks   Drug use: No   Sexual activity: Yes    Birth control/protection: None  Other Topics Concern   Not on file  Social History Narrative   Not on file   Social Determinants of Health   Financial Resource Strain: Not on file  Food Insecurity: Not on file  Transportation Needs: Not on file  Physical Activity: Not on file  Stress: Not on file  Social Connections: Not on file  Intimate Partner Violence: Not on file    FAMILY HISTORY:  Family History  Problem Relation Age of Onset   Heart disease Mother    Cancer Mother    Diabetes Father    Arthritis Other    Anesthesia problems Neg Hx    Hypotension Neg Hx    Malignant hyperthermia Neg Hx    Pseudochol deficiency Neg Hx     CURRENT MEDICATIONS:  Current Outpatient Medications  Medication Sig Dispense Refill   acetaminophen (TYLENOL) 500 MG tablet Take 500-1,000 mg by mouth every 6 (six) hours as needed for mild pain or moderate  pain.     acyclovir (ZOVIRAX) 400 MG tablet Take 1 tablet (400 mg total) by mouth every morning. 30 tablet 5   allopurinol (ZYLOPRIM) 300 MG tablet TAKE ONE TABLET BY MOUTH ONCE DAILY. 30 tablet 2   Artificial Tear Ointment (DRY EYES OP) Place 1 drop into both eyes as needed (dry eyes).     atorvastatin (LIPITOR) 80 MG tablet TAKE ONE TABLET BY MOUTH AT BEDTIME. 90 tablet 3   Cholecalciferol (D2000 ULTRA STRENGTH) 50 MCG (2000 UT) CAPS Take by mouth.     CONTOUR NEXT TEST test strip      cyclobenzaprine (FLEXERIL) 5 MG tablet Take by mouth.     dicyclomine (BENTYL) 20 MG tablet TAKE 1 TABLET BY MOUTH BEFORE MEALS 3 TIMES DAILY. 90 tablet 2   Eluxadoline 100 MG TABS Take 100 mg by mouth 2 (two) times daily with a meal.     epoetin alfa (EPOGEN) 3000 UNIT/ML injection Inject into the skin.     ferrous sulfate 325 (65 FE) MG EC tablet Take 45 mg by mouth. Every other day     gabapentin (NEURONTIN) 600 MG tablet TAKE 1 TABLET BY MOUTH ONCE A DAY. 30 tablet 0   hydrALAZINE (APRESOLINE) 100 MG tablet Take 1 tablet (100 mg total) by mouth 3 (three) times daily. 270 tablet 3   insulin aspart (NOVOLOG) 100 UNIT/ML injection Inject into the skin. Sliding scale over 150= 1 unit, 200= 2 units,250= 3 units     insulin detemir (LEVEMIR) 100 UNIT/ML injection Inject 0.1 mLs (10 Units total) into the skin every morning. 10 mL 11   INSULIN SYRINGE .5CC/29G 29G X 1/2" 0.5 ML MISC      isosorbide mononitrate (IMDUR) 30 MG 24 hr tablet Take 0.5 tablets (15 mg total) by mouth daily. 45 tablet 3   lisinopril (ZESTRIL) 5 MG tablet Take 5 mg by mouth daily.     loratadine (CLARITIN) 10 MG tablet Take 10 mg by mouth every morning.     Magnesium Cl-Calcium Carbonate (SLOW-MAG PO) Take 1 tablet by mouth every morning.     Microlet Lancets MISC      Multiple Vitamins-Minerals (MULTIVITAMINS THER. W/MINERALS) TABS Take 1 tablet by mouth daily.     nitroGLYCERIN (NITROSTAT) 0.4 MG SL tablet DISSOLVE 1 TABLET UNDER TONGUE  EVERY 5 MINUTES UP TO 15 MIN FOR CHESTPAIN. IF NO RELIEF CALL 911. 25  tablet 0   pantoprazole (PROTONIX) 40 MG tablet Take 40 mg by mouth daily.     potassium chloride SA (KLOR-CON) 20 MEQ tablet Take 20 mEq by mouth daily. Every other day     sodium bicarbonate 650 MG tablet Take 650 mg by mouth 3 (three) times daily.     SURE COMFORT PEN NEEDLES 29G X 12.7MM MISC USE AS DIRECTED FOURCTIMES DAILY.     torsemide (DEMADEX) 20 MG tablet Take 40 mg by mouth every other day alternating with 20 mg     No current facility-administered medications for this visit.   Facility-Administered Medications Ordered in Other Visits  Medication Dose Route Frequency Provider Last Rate Last Admin   sodium chloride 0.9 % injection 10 mL  10 mL Intravenous Once Farrel Gobble, MD        ALLERGIES:  Allergies  Allergen Reactions   Beef-Derived Products Diarrhea   Pork-Derived Products Diarrhea   Other     Band aids     PHYSICAL EXAM:  Performance status (ECOG): 1 - Symptomatic but completely ambulatory  There were no vitals filed for this visit. Wt Readings from Last 3 Encounters:  01/08/21 (!) 385 lb 6.4 oz (174.8 kg)  12/21/20 (!) 388 lb (176 kg)  12/19/20 (!) 390 lb 3.2 oz (177 kg)   Physical Exam Vitals reviewed.  Constitutional:      Appearance: Normal appearance.     Comments: In wheelchair  Cardiovascular:     Rate and Rhythm: Normal rate and regular rhythm.     Pulses: Normal pulses.     Heart sounds: Normal heart sounds.  Pulmonary:     Effort: Pulmonary effort is normal.     Breath sounds: Normal breath sounds.  Neurological:     General: No focal deficit present.     Mental Status: He is alert and oriented to person, place, and time.  Psychiatric:        Mood and Affect: Mood normal.        Behavior: Behavior normal.     LABORATORY DATA:  I have reviewed the labs as listed.  CBC Latest Ref Rng & Units 02/26/2021 01/29/2021 01/15/2021  WBC 4.0 - 10.5 K/uL - - -  Hemoglobin  13.0 - 17.0 g/dL 9.4(L) 9.8(L) 9.1(L)  Hematocrit 39.0 - 52.0 % - - -  Platelets 150 - 400 K/uL - - -   CMP Latest Ref Rng & Units 12/13/2019 06/10/2019 02/01/2019  Glucose 70 - 99 mg/dL 131(H) 157(H) 144(H)  BUN 6 - 20 mg/dL 75(H) 40(H) 55(H)  Creatinine 0.61 - 1.24 mg/dL 2.78(H) 2.26(H) 2.38(H)  Sodium 135 - 145 mmol/L 140 142 139  Potassium 3.5 - 5.1 mmol/L 4.6 4.4 3.8  Chloride 98 - 111 mmol/L 107 113(H) 112(H)  CO2 22 - 32 mmol/L 22 23 19(L)  Calcium 8.9 - 10.3 mg/dL 8.8(L) 8.3(L) 8.5(L)  Total Protein 6.5 - 8.1 g/dL 7.3 6.5 6.7  Total Bilirubin 0.3 - 1.2 mg/dL 0.5 0.6 0.9  Alkaline Phos 38 - 126 U/L 115 101 106  AST 15 - 41 U/L 31 27 41  ALT 0 - 44 U/L 33 27 39    DIAGNOSTIC IMAGING:  I have independently reviewed the scans and discussed with the patient. No results found.   ASSESSMENT:  1.  Multiple myeloma: -Immunofixation has been negative since 2017.   2.  Macrocytic anemia: -He has mild anemia from CKD and relative iron deficiency. -Last Feraheme was on 12/14/2017. -He is taking iron  tablet every other day.   PLAN:  1.  Multiple myeloma: - He does not report any new onset bone pains. - No new infections.  Feet numbness has been stable. - We will order myeloma labs including SPEP, immunofixation and free light chains. - We will discuss results in 2 weeks with a phone visit. - If they remain negative, will monitor every 6 months.   2.  Normocytic anemia: - He is receiving Retacrit 10,000 units every other week under the direction of [name]. - Last hemoglobin has been stable between 9 and 10.  Denies any bleeding per rectum or melena. - We will check ferritin, iron panel.   Orders placed this encounter:  No orders of the defined types were placed in this encounter.    Derek Jack, MD [facility] [phone]   I, Thana Ates, am acting as a scribe for Dr. Derek Jack.  I, Derek Jack MD, have reviewed the above  documentation for accuracy and completeness, and I agree with the above.

## 2021-03-04 ENCOUNTER — Other Ambulatory Visit: Payer: Self-pay

## 2021-03-04 ENCOUNTER — Inpatient Hospital Stay (HOSPITAL_COMMUNITY): Payer: BC Managed Care – PPO | Admitting: Hematology

## 2021-03-04 VITALS — BP 145/69 | HR 64 | Temp 98.0°F | Resp 16

## 2021-03-04 DIAGNOSIS — I10 Essential (primary) hypertension: Secondary | ICD-10-CM | POA: Insufficient documentation

## 2021-03-04 DIAGNOSIS — S81801D Unspecified open wound, right lower leg, subsequent encounter: Secondary | ICD-10-CM | POA: Diagnosis not present

## 2021-03-04 DIAGNOSIS — N170 Acute kidney failure with tubular necrosis: Secondary | ICD-10-CM | POA: Diagnosis not present

## 2021-03-04 DIAGNOSIS — N189 Chronic kidney disease, unspecified: Secondary | ICD-10-CM | POA: Diagnosis not present

## 2021-03-04 DIAGNOSIS — E1122 Type 2 diabetes mellitus with diabetic chronic kidney disease: Secondary | ICD-10-CM | POA: Diagnosis not present

## 2021-03-04 DIAGNOSIS — Z23 Encounter for immunization: Secondary | ICD-10-CM

## 2021-03-04 DIAGNOSIS — D649 Anemia, unspecified: Secondary | ICD-10-CM

## 2021-03-04 DIAGNOSIS — C9000 Multiple myeloma not having achieved remission: Secondary | ICD-10-CM

## 2021-03-04 DIAGNOSIS — E119 Type 2 diabetes mellitus without complications: Secondary | ICD-10-CM | POA: Insufficient documentation

## 2021-03-04 DIAGNOSIS — D638 Anemia in other chronic diseases classified elsewhere: Secondary | ICD-10-CM | POA: Diagnosis not present

## 2021-03-04 DIAGNOSIS — Z87891 Personal history of nicotine dependence: Secondary | ICD-10-CM | POA: Insufficient documentation

## 2021-03-04 DIAGNOSIS — R262 Difficulty in walking, not elsewhere classified: Secondary | ICD-10-CM | POA: Insufficient documentation

## 2021-03-04 MED ORDER — INFLUENZA VAC SPLIT QUAD 0.5 ML IM SUSY
0.5000 mL | PREFILLED_SYRINGE | Freq: Once | INTRAMUSCULAR | Status: AC
  Administered 2021-03-04: 0.5 mL via INTRAMUSCULAR
  Filled 2021-03-04: qty 0.5

## 2021-03-04 NOTE — Patient Instructions (Addendum)
[place] at [facility] Discharge Instructions  You were seen today by [name]. He went over your recent results. [name] will reach out to you over the phone in 2 weeks for labs and follow up.   Thank you for choosing [place] at [facility] to provide your oncology and hematology care.  To afford each patient quality time with our provider, please arrive at least 15 minutes before your scheduled appointment time.   If you have a lab appointment with the [facility] please come in thru the Main Entrance and check in at the main information desk  You need to re-schedule your appointment should you arrive 10 or more minutes late.  We strive to give you quality time with our providers, and arriving late affects you and other patients whose appointments are after yours.  Also, if you no show three or more times for appointments you may be dismissed from the clinic at the providers discretion.     Again, thank you for choosing [facility].  Our hope is that these requests will decrease the amount of time that you wait before being seen by our physicians.       _____________________________________________________________  Should you have questions after your visit to [facility], please contact our office at [phone] between the hours of 8:00 a.m. and 4:30 p.m.  Voicemails left after 4:00 p.m. will not be returned until the following business day.  For prescription refill requests, have your pharmacy contact our office and allow 72 hours.    Cancer Center Support Programs:   > Cancer Support Group  2nd Tuesday of the month 1pm-2pm, Journey Room

## 2021-03-04 NOTE — Progress Notes (Signed)
[name] here for visit with [name]. Flu vaccine ordered. See MAR for administration information. Patient tolerated and is stable through injection. Patient discharged with wife via wheel chair in stable condition.

## 2021-03-05 DIAGNOSIS — B353 Tinea pedis: Secondary | ICD-10-CM | POA: Diagnosis not present

## 2021-03-05 DIAGNOSIS — E1142 Type 2 diabetes mellitus with diabetic polyneuropathy: Secondary | ICD-10-CM | POA: Diagnosis not present

## 2021-03-06 DIAGNOSIS — N170 Acute kidney failure with tubular necrosis: Secondary | ICD-10-CM | POA: Diagnosis not present

## 2021-03-06 DIAGNOSIS — R809 Proteinuria, unspecified: Secondary | ICD-10-CM | POA: Diagnosis not present

## 2021-03-06 DIAGNOSIS — N189 Chronic kidney disease, unspecified: Secondary | ICD-10-CM | POA: Diagnosis not present

## 2021-03-06 DIAGNOSIS — D638 Anemia in other chronic diseases classified elsewhere: Secondary | ICD-10-CM | POA: Diagnosis not present

## 2021-03-12 ENCOUNTER — Encounter (HOSPITAL_COMMUNITY): Payer: Self-pay

## 2021-03-12 ENCOUNTER — Encounter (HOSPITAL_COMMUNITY)
Admission: RE | Admit: 2021-03-12 | Discharge: 2021-03-12 | Disposition: A | Discharge status: Home / Self Care | Payer: BC Managed Care – PPO | Source: Ambulatory Visit | Attending: Nephrology | Admitting: Nephrology

## 2021-03-12 DIAGNOSIS — N184 Chronic kidney disease, stage 4 (severe): Secondary | ICD-10-CM | POA: Diagnosis not present

## 2021-03-12 DIAGNOSIS — D631 Anemia in chronic kidney disease: Secondary | ICD-10-CM | POA: Insufficient documentation

## 2021-03-12 LAB — POCT HEMOGLOBIN-HEMACUE: Hemoglobin: 9.9 g/dL — ABNORMAL LOW (ref 13.0–17.0)

## 2021-03-12 MED ORDER — EPOETIN ALFA-EPBX 10000 UNIT/ML IJ SOLN
10000.0000 [IU] | Freq: Once | INTRAMUSCULAR | Status: AC
  Administered 2021-03-12: 10000 [IU] via SUBCUTANEOUS
  Filled 2021-03-12: qty 1

## 2021-03-14 ENCOUNTER — Encounter (HOSPITAL_COMMUNITY): Payer: Self-pay | Admitting: Physical Therapy

## 2021-03-14 ENCOUNTER — Other Ambulatory Visit: Payer: Self-pay

## 2021-03-14 ENCOUNTER — Ambulatory Visit (HOSPITAL_COMMUNITY): Payer: BC Managed Care – PPO | Attending: Family Medicine | Admitting: Physical Therapy

## 2021-03-14 DIAGNOSIS — R262 Difficulty in walking, not elsewhere classified: Secondary | ICD-10-CM

## 2021-03-14 DIAGNOSIS — S81801D Unspecified open wound, right lower leg, subsequent encounter: Secondary | ICD-10-CM

## 2021-03-14 NOTE — Therapy (Signed)
[facility] [address] [place], [place], [address] Phone: [phone]   Fax:  [phone]  Wound Care Evaluation  Patient Details  Name: [name] MRN: [id number] Date of Birth: 1960/06/17 Referring Provider (PT): [name] Waller   Encounter Date: 03/14/2021   PT End of Session - 03/14/21 0819     Visit Number 1    Number of Visits 8    Date for PT Re-Evaluation 04/11/21    Authorization Type [employer] (no auth, no [id number])    PT Start Time 0744    PT Stop Time 0812    PT Time Calculation (min) 28 min    Activity Tolerance Patient tolerated treatment well    Behavior During Therapy [facility] for tasks assessed/performed             Past Medical History:  Diagnosis Date   Abscess 04/2017   Scrotal   Anemia    Arteriosclerotic cardiovascular disease (ASCVD)    MI-2000s; stent to the proximal LAD and diagonal in 2001; stress nuclear in 2008-impaired exercise capacity, left ventricular dilatation, moderately to severely depressed EF, apical, inferior and anteroseptal scar   Arthritis    Atrial flutter (HCC)    Bence-Jones proteinuria 05/05/2011   Cellulitis of leg    both legs   Chronic diarrhea    Chronic kidney disease, stage 3, mod decreased GFR (HCC)    Creatinine of 1.84 in 06/2011 and 1.5 in 07/2011   Diabetes mellitus    Insulin   Dysrhythmia    AFlutter   GERD (gastroesophageal reflux disease)    Gout    Hyperlipidemia    Hypertension    Injection site reaction    Multiple myeloma 07/01/2011   Myocardial infarction ([place]) 2000   Obesity    Pedal edema    Venous insufficiency   Sleep apnea    uses cpap   Ulcer     Past Surgical History:  Procedure Laterality Date   ABSCESS DRAINAGE     Scrotal   BIOPSY  01/02/2012   Procedure: BIOPSY;  Surgeon: Rogene Houston, Waller;  [facility];  Service: Endoscopy;  Laterality: N/A;   BONE MARROW BIOPSY  05/13/11   CARDIAC CATHETERIZATION     cardiac stent    CARDIOVERSION N/A 10/13/2012   Procedure: CARDIOVERSION;  Surgeon: Yehuda Savannah, Waller;  Location: [facility];  Service: Cardiovascular;  Laterality: N/A;   CATARACT EXTRACTION W/PHACO Left 02/13/2014   Procedure: CATARACT EXTRACTION PHACO AND INTRAOCULAR LENS PLACEMENT ([place]);  Surgeon: [name];  Location: [facility];  Service: Ophthalmology;  Laterality: Left;  CDE:  7.67   CATARACT EXTRACTION W/PHACO Right 03/02/2014   Procedure: CATARACT EXTRACTION PHACO AND INTRAOCULAR LENS PLACEMENT RIGHT EYE CDE=16.81;  Surgeon: [name];  Location: [facility];  Service: Ophthalmology;  Laterality: Right;   COLONOSCOPY  11/28/2011   Procedure: COLONOSCOPY;  Surgeon: Rogene Houston, Waller;  [facility];  Service: Endoscopy;  Laterality: N/A;  [name] 12/16/2017   Procedure: CYSTOSCOPY WITH RIGHT RETROGRADE PYELOGRAM AND RIGHT URETERAL STENT REMOVAL;  Surgeon: [name];  Location: [facility];  Service: Urology;  Laterality: Right;   CYSTOSCOPY W/ URETERAL STENT PLACEMENT Right 07/31/2017   Procedure: CYSTOSCOPY WITH RETROGRADE PYELOGRAM/URETERAL STENT PLACEMENT;  Surgeon: [name];  Location: [facility];  Service: Urology;  Laterality: Right;   ESOPHAGOGASTRODUODENOSCOPY  01/02/2012   Procedure: ESOPHAGOGASTRODUODENOSCOPY (EGD);  Surgeon:  Rogene Houston, Waller;  [facility];  Service: Endoscopy;  Laterality: N/A;  100   ESOPHAGOGASTRODUODENOSCOPY N/A 09/20/2012   Procedure: ESOPHAGOGASTRODUODENOSCOPY (EGD);  Surgeon: Rogene Houston, Waller;  [facility];  Service: Endoscopy;  Laterality: N/A;   EUS N/A 10/07/2012   Procedure: UPPER ENDOSCOPIC ULTRASOUND (EUS) LINEAR;  Surgeon: [name];  [facility];  Service: Endoscopy;  Laterality: N/A;   INCISION AND DRAINAGE ABSCESS N/A 04/14/2017   Procedure: INCISION AND DRAINAGE ABSCESS;  Surgeon: [name];  [facility]: [facility];  Service: Urology;  Laterality:  N/A;   INCISION AND DRAINAGE ABSCESS ANAL     IRRIGATION AND DEBRIDEMENT ABSCESS N/A 04/15/2017   Procedure: DEBRIDEMENT SCROTAL WOUND AND DRESSING CHANGE;  Surgeon: [name];  [facility];  Service: Urology;  Laterality: N/A;   IRRIGATION AND DEBRIDEMENT ABSCESS N/A 04/17/2017   Procedure: IRRIGATION AND DEBRIDEMENT ABSCESS;  Surgeon: [name];  [facility];  Service: Urology;  Laterality: N/A;  RM 3   LAPAROSCOPIC GASTRIC BANDING  2006   has been removed   OSTECTOMY Right 04/08/2017   Procedure: OSTECTOMY RIGHT GREAT TOE;  Surgeon: [name];  [facility]: [facility];  Service: Podiatry;  Laterality: Right;   PORT-A-CATH REMOVAL Left 12/07/2012   Procedure: REMOVAL PORT-A-CATH;  Surgeon: [name];  Location: [facility];  Service: General;  Laterality: Left;   PORT-A-CATH REMOVAL N/A 10/02/2017   Procedure: MINOR REMOVAL PORT-A-CATH AT BEDSIDE;  Surgeon: [name];  Location: [facility];  Service: General;  Laterality: N/A;   PORTACATH PLACEMENT  07/07/2011   Procedure: INSERTION PORT-A-CATH;  Surgeon: [name];  [facility]: [facility];  Service: General;  Laterality: N/A;   PORTACATH PLACEMENT N/A 12/07/2012   Procedure: INSERTION PORT-A-CATH;  Surgeon: [name];  [facility]: [facility];  Service: General;  Laterality: N/A;  Attempted portacath placement on left and right side   URETEROSCOPY Right 12/16/2017   Procedure: DIAGNOSTIC RIGHT URETEROSCOPY;  Surgeon: [name];  Location: [facility];  Service: Urology;  Laterality: Right;   WOUND DEBRIDEMENT Right 04/08/2017   Procedure: EXCISION ULCERATION RIGHT GREAT TOE;  Surgeon: [name];  Location: [facility];  Service: Podiatry;  Laterality: Right;   WRIST SURGERY     Left; removal of bone fragment    There were no vitals filed for this visit.     [facility] PT Assessment - 03/14/21 0001       Assessment   Medical Diagnosis Open wound of  R lower Leg    Referring Provider (PT) [name] Waller             Wound Therapy - 03/14/21 0001     Subjective Patient is a 60 y.o. male who presents to physical therapy with referral for open wound of R lower leg. He has had wound for about 1 month and then another developed about a week ago. Skin ripped when taking off bandage. Wears compression garments and has compression pumps for lymphedema. Has tried Neosporin and other creams. Wife has been keeping it clean and dressed.    Patient and Family Stated Goals wound to heal    Date of Onset 02/12/21    Prior Treatments compression, neosporin and other topicals    Pain Scale 0-10    Pain Score 4     Evaluation and Treatment Procedures Explained to Patient/Family Yes    Evaluation and  Treatment Procedures agreed to    Wound Properties Date First Assessed: 03/14/21 Time First Assessed: 0745 Wound Type: Skin tear Location: Pretibial Location Orientation: Distal;Right;Lateral Wound Description (Comments): lateral shin wound Present on Admission: Yes   Wound Image Images linked: 1    Dressing Type Gauze (Comment)    Dressing Changed Changed    Dressing Status Old drainage    Dressing Change Frequency PRN    Site / Wound Assessment Granulation tissue;Red;Pink    % Wound base Red or Granulating 100%    Wound Length (cm) 3.7 cm    Wound Width (cm) 3.3 cm    Wound Surface Area (cm^2) 12.21 cm^2    Margins Attached edges (approximated)    Drainage Amount Scant    Drainage Description Serous    Treatment Cleansed    Wound Properties Date First Assessed: 03/14/21 Time First Assessed: 0745 Wound Type: Skin tear Location: Pretibial Location Orientation: Distal;Right;Medial Wound Description (Comments): medial shin wound Present on Admission: Yes   Dressing Type Gauze (Comment)    Dressing Changed Changed    Dressing Status Old drainage    Dressing Change Frequency PRN    Site / Wound Assessment Granulation tissue;Pink;Red    % Wound base  Red or Granulating 100%    Wound Length (cm) 1.1 cm    Wound Width (cm) 1.6 cm    Wound Surface Area (cm^2) 1.76 cm^2    Margins Attached edges (approximated)    Drainage Amount Scant    Drainage Description Serous    Treatment Cleansed    Wound Properties Date First Assessed: 03/14/21 Time First Assessed: 0745 Wound Type: Skin tear Location: Pretibial Location Orientation: Distal;Right Wound Description (Comments): R shin wound inferior Present on Admission: Yes   Dressing Type Gauze (Comment)    Dressing Changed Changed    Dressing Status Old drainage    Dressing Change Frequency PRN    Site / Wound Assessment Granulation tissue;Pink;Red    % Wound base Red or Granulating 100%    Wound Length (cm) 0.6 cm    Wound Width (cm) 0.5 cm    Wound Surface Area (cm^2) 0.3 cm^2    Margins Attached edges (approximated)    Drainage Amount Minimal    Drainage Description Serosanguineous    Treatment Cleansed    Wound Therapy - Clinical Statement Patient is a 60 y.o. male who presents to physical therapy with referral for open wound of R lower leg. Patient's wounds appearing clean and healthy as his wife has been taking care of them. Wounds are granulation tissue with minimal drainage. Cleansed wounds and applied Vaseline to shin along with xeroform, 4x4 to wound beds and wrapped with kerlix. Pulled up compression stockings over top. Educated patient on removal of dressing if soiled. Patient will benefit from PT to promote wound healing.    Wound Therapy - Functional Problem List walking    Factors Delaying/Impairing Wound Healing Diabetes Mellitus;Multiple medical problems    Hydrotherapy Plan Debridement;Dressing change;Patient/family education;Pulsatile lavage with suction    Wound Therapy - Frequency 2X / week    Wound Therapy - Current Recommendations PT    Wound Plan dressing changes and debride PRN to promote wound healing    Dressing  vaseline, xeroform, 4x4, kerlix                    Objective measurements completed on examination: See above findings.             PT Education - 03/14/21  0741     Education Details Patient educated on exam findings, POC, scope of PT    Person(s) Educated Patient    Methods Explanation    Comprehension Verbalized understanding              PT Short Term Goals - 03/14/21 0825       PT SHORT TERM GOAL #1   Title Patient wound sizes to decrease 50% to decrease risk of infection.    Time 2    Period Weeks    Status New    Target Date 03/28/21      PT SHORT TERM GOAL #2   Title Patient to experience pain no more than 1/10 in wound area in order to show general improvement of condition     Time 2    Period Weeks    Status New    Target Date 03/28/21               PT Long Term Goals - 03/14/21 0826       PT LONG TERM GOAL #1   Title Patient to demonstrate full wound healing in order to show resolution of condition     Time 4    Period Weeks    Status Achieved    Target Date 04/11/21      PT LONG TERM GOAL #2   Title Patient and family to be independent in skin care strategies and edema control in order to assist in prevention of other wound formation     Time 4    Period Weeks    Status New    Target Date 04/11/21                  Plan - 03/14/21 0820     Clinical Impression Statement see above    Personal Factors and Comorbidities Comorbidity 3+    Comorbidities CKD, HTN, HLD, hx MI, CVD, DM, lymphedema    Examination-Activity Limitations Bathing;Locomotion Level;Transfers;Stand    Examination-Participation Restrictions Occupation;Community Activity    Stability/Clinical Decision Making Stable/Uncomplicated    Clinical Decision Making Low    Rehab Potential Good    PT Frequency 2x / week    PT Duration 4 weeks    PT Treatment/Interventions ADLs/Self Care Home Management;Cryotherapy;Electrical Stimulation;Moist Heat;Gait training;Stair training;Functional mobility  training;Therapeutic activities;Therapeutic exercise;Balance training;Neuromuscular re-education;Patient/family education;Manual lymph drainage;Manual techniques;Compression bandaging;Scar mobilization    PT Next Visit Plan change dressings and debride PRN to promote wound healing    Consulted and Agree with Plan of Care Patient             Patient will benefit from skilled therapeutic intervention in order to improve the following deficits and impairments:  Abnormal gait, Difficulty walking, Decreased skin integrity  Visit Diagnosis: Wound of right leg, subsequent encounter  Difficulty in walking, not elsewhere classified    Problem List Patient Active Problem List   Diagnosis Date Noted   Multiple myeloma in remission ([place]) 11/10/2017   CKD (chronic kidney disease) stage 3, GFR 30-59 ml/min (HCC) 56/25/6389   Chronic systolic CHF (congestive heart failure) ([place]) 11/10/2017   MRSA bacteremia 10/03/2017   Pressure injury of skin 08/04/2017   Fournier's gangrene of scrotum 04/14/2017   Hypogammaglobulinemia ([place]) 03/09/2016   Diaphragmatic hernia without obstruction 37/34/2876   Chronic systolic heart failure ([place]) 12/19/2013   Anemia, normocytic normochromic 10/09/2012   Chronic pancreatitis ([place]) 10/07/2012   Atrial flutter ([place]) 09/17/2012   DDD (degenerative disc disease), cervical 03/18/2012   Arteriosclerotic cardiovascular  disease (ASCVD)    Hypertension    Hyperlipidemia    Multiple myeloma ([place]) 07/01/2011   Morbid obesity ([place]) 04/29/2010   Insulin dependent diabetes mellitus 11/15/2008   OSA on CPAP 11/15/2008    8:40 AM, 03/14/21 Mearl Latin PT, DPT Physical Therapist at [facility] [place], [place], [address] Phone: [phone]   Fax:  [phone]  Name: QUINTEL MCCALLA MRN: [id number] Date of Birth: May 06, 1961

## 2021-03-18 ENCOUNTER — Encounter (HOSPITAL_COMMUNITY): Payer: Self-pay | Admitting: Physical Therapy

## 2021-03-18 ENCOUNTER — Ambulatory Visit (HOSPITAL_COMMUNITY): Payer: BC Managed Care – PPO | Admitting: Physical Therapy

## 2021-03-18 ENCOUNTER — Other Ambulatory Visit: Payer: Self-pay

## 2021-03-18 DIAGNOSIS — R262 Difficulty in walking, not elsewhere classified: Secondary | ICD-10-CM | POA: Diagnosis not present

## 2021-03-18 DIAGNOSIS — S81801D Unspecified open wound, right lower leg, subsequent encounter: Secondary | ICD-10-CM | POA: Diagnosis not present

## 2021-03-18 NOTE — Therapy (Signed)
[place], [place], [address] Phone: [phone]   Fax:  [phone]  Wound Care Therapy  Patient Details  Name: [name] MRN: [id number] Date of Birth: 10-Oct-1960 Referring Provider (PT): [name] MD   Encounter Date: 03/18/2021   PT End of Session - 03/18/21 1622     Visit Number 2    Number of Visits 8    Date for PT Re-Evaluation 04/11/21    Authorization Type [employer] (no auth, no [id number])    PT Start Time 1622    PT Stop Time 1656    PT Time Calculation (min) 34 min    Activity Tolerance Patient tolerated treatment well    Behavior During Therapy [facility] for tasks assessed/performed             Past Medical History:  Diagnosis Date   Abscess 04/2017   Scrotal   Anemia    Arteriosclerotic cardiovascular disease (ASCVD)    MI-2000s; stent to the proximal LAD and diagonal in 2001; stress nuclear in 2008-impaired exercise capacity, left ventricular dilatation, moderately to severely depressed EF, apical, inferior and anteroseptal scar   Arthritis    Atrial flutter (HCC)    Bence-Jones proteinuria 05/05/2011   Cellulitis of leg    both legs   Chronic diarrhea    Chronic kidney disease, stage 3, mod decreased GFR (HCC)    Creatinine of 1.84 in 06/2011 and 1.5 in 07/2011   Diabetes mellitus    Insulin   Dysrhythmia    AFlutter   GERD (gastroesophageal reflux disease)    Gout    Hyperlipidemia    Hypertension    Injection site reaction    Multiple myeloma 07/01/2011   Myocardial infarction ([place]) 2000   Obesity    Pedal edema    Venous insufficiency   Sleep apnea    uses cpap   Ulcer     Past Surgical History:  Procedure Laterality Date   ABSCESS DRAINAGE     Scrotal   BIOPSY  01/02/2012   Procedure: BIOPSY;  Surgeon: Rogene Houston, MD;  [facility];  Service: Endoscopy;  Laterality: N/A;   BONE MARROW BIOPSY  05/13/11   CARDIAC CATHETERIZATION     cardiac stent    CARDIOVERSION N/A 10/13/2012   Procedure: CARDIOVERSION;  Surgeon: Yehuda Savannah, MD;  Location: [facility];  Service: Cardiovascular;  Laterality: N/A;   CATARACT EXTRACTION W/PHACO Left 02/13/2014   Procedure: CATARACT EXTRACTION PHACO AND INTRAOCULAR LENS PLACEMENT ([place]);  Surgeon: Tonny Branch, MD;  Location: [facility];  Service: Ophthalmology;  Laterality: Left;  CDE:  7.67   CATARACT EXTRACTION W/PHACO Right 03/02/2014   Procedure: CATARACT EXTRACTION PHACO AND INTRAOCULAR LENS PLACEMENT RIGHT EYE CDE=16.81;  Surgeon: Tonny Branch, MD;  Location: [facility];  Service: Ophthalmology;  Laterality: Right;   COLONOSCOPY  11/28/2011   Procedure: COLONOSCOPY;  Surgeon: Rogene Houston, MD;  [facility];  Service: Endoscopy;  Laterality: N/A;  [place] Right 12/16/2017   Procedure: CYSTOSCOPY WITH RIGHT RETROGRADE PYELOGRAM AND RIGHT URETERAL STENT REMOVAL;  Surgeon: Cleon Gustin, MD;  Location: [facility];  Service: Urology;  Laterality: Right;   CYSTOSCOPY W/ URETERAL STENT PLACEMENT Right 07/31/2017   Procedure: CYSTOSCOPY WITH RETROGRADE PYELOGRAM/URETERAL STENT PLACEMENT;  Surgeon: Cleon Gustin, MD;  [facility]: [facility];  Service: Urology;  Laterality: Right;   ESOPHAGOGASTRODUODENOSCOPY  01/02/2012   Procedure: ESOPHAGOGASTRODUODENOSCOPY (EGD);  Surgeon:  Rogene Houston, MD;  [facility];  Service: Endoscopy;  Laterality: N/A;  100   ESOPHAGOGASTRODUODENOSCOPY N/A 09/20/2012   Procedure: ESOPHAGOGASTRODUODENOSCOPY (EGD);  Surgeon: Rogene Houston, MD;  [facility];  Service: Endoscopy;  Laterality: N/A;   EUS N/A 10/07/2012   Procedure: UPPER ENDOSCOPIC ULTRASOUND (EUS) LINEAR;  Surgeon: Milus Banister, MD;  [facility];  Service: Endoscopy;  Laterality: N/A;   INCISION AND DRAINAGE ABSCESS N/A 04/14/2017   Procedure: INCISION AND DRAINAGE ABSCESS;  Surgeon: Ceasar Mons, MD;  [facility];  Service: Urology;  Laterality:  N/A;   INCISION AND DRAINAGE ABSCESS ANAL     IRRIGATION AND DEBRIDEMENT ABSCESS N/A 04/15/2017   Procedure: DEBRIDEMENT SCROTAL WOUND AND DRESSING CHANGE;  Surgeon: Ceasar Mons, MD;  [facility];  Service: Urology;  Laterality: N/A;   IRRIGATION AND DEBRIDEMENT ABSCESS N/A 04/17/2017   Procedure: IRRIGATION AND DEBRIDEMENT ABSCESS;  Surgeon: Ceasar Mons, MD;  [facility];  Service: Urology;  Laterality: N/A;  RM 3   LAPAROSCOPIC GASTRIC BANDING  2006   has been removed   OSTECTOMY Right 04/08/2017   Procedure: OSTECTOMY RIGHT GREAT TOE;  Surgeon: Caprice Beaver, DPM;  [facility];  Service: Podiatry;  Laterality: Right;   PORT-A-CATH REMOVAL Left 12/07/2012   Procedure: REMOVAL PORT-A-CATH;  Surgeon: Scherry Ran, MD;  Location: [facility];  Service: General;  Laterality: Left;   PORT-A-CATH REMOVAL N/A 10/02/2017   Procedure: MINOR REMOVAL PORT-A-CATH AT BEDSIDE;  Surgeon: Donnie Mesa, MD;  Location: [place];  Service: General;  Laterality: N/A;   PORTACATH PLACEMENT  07/07/2011   Procedure: INSERTION PORT-A-CATH;  Surgeon: Scherry Ran, MD;  [facility];  Service: General;  Laterality: N/A;   PORTACATH PLACEMENT N/A 12/07/2012   Procedure: INSERTION PORT-A-CATH;  Surgeon: Scherry Ran, MD;  Location: [facility];  Service: General;  Laterality: N/A;  Attempted portacath placement on left and right side   URETEROSCOPY Right 12/16/2017   Procedure: DIAGNOSTIC RIGHT URETEROSCOPY;  Surgeon: Cleon Gustin, MD;  Location: [facility];  Service: Urology;  Laterality: Right;   WOUND DEBRIDEMENT Right 04/08/2017   Procedure: EXCISION ULCERATION RIGHT GREAT TOE;  Surgeon: Caprice Beaver, DPM;  Location: [facility];  Service: Podiatry;  Laterality: Right;   WRIST SURGERY     Left; removal of bone fragment    There were no vitals filed for this visit.      [facility] PT Assessment - 03/18/21 0001       Assessment   Medical Diagnosis Open wound  of R lower Leg    [name]                     Wound Therapy - 03/18/21 0001     Subjective reports his leg feels a litlte better, no current pain. States he thinks he needs new cccompression stockings    Patient and Family Stated Goals wound to heal    Date of Onset 02/12/21    Prior Treatments compression, neosporin and other topicals    Evaluation and Treatment Procedures Explained to Patient/Family Yes    Evaluation and Treatment Procedures agreed to    Wound Properties Date First Assessed: 03/14/21 Time First Assessed: 0745 Wound Type: Skin tear Location: Pretibial Location Orientation: Distal;Right;Lateral Wound Description (Comments): lateral shin wound Present on Admission: Yes   Dressing Type Gauze (Comment);Impregnated gauze (bismuth)    Dressing Changed Changed  Dressing Status Old drainage    Dressing Change Frequency PRN    Site / Wound Assessment Granulation tissue    % Wound base Red or Granulating 100%    Peri-wound Assessment Edema;Erythema (blanchable)    Margins Attached edges (approximated)    Drainage Amount Minimal    Drainage Description Serosanguineous    Treatment Cleansed;Debridement (Selective)    Wound Properties Date First Assessed: 03/14/21 Time First Assessed: 0745 Wound Type: Skin tear Location: Pretibial Location Orientation: Distal;Right;Medial Wound Description (Comments): medial shin wound Present on Admission: Yes   Dressing Type Gauze (Comment);Impregnated gauze (bismuth)    Dressing Changed Changed    Dressing Status Old drainage    Dressing Change Frequency PRN    Site / Wound Assessment Granulation tissue    % Wound base Red or Granulating 100%    Peri-wound Assessment Erythema (blanchable);Edema;Maceration    Margins Attached edges (approximated)    Drainage Amount Minimal    Drainage Description Serosanguineous    Treatment Cleansed;Debridement (Selective)    Wound Properties Date First  Assessed: 03/14/21 Time First Assessed: 0745 Wound Type: Skin tear Location: Pretibial Location Orientation: Distal;Right Wound Description (Comments): R shin wound inferior Present on Admission: Yes   Dressing Type Gauze (Comment);Impregnated gauze (bismuth)    Dressing Changed Changed    Dressing Status Old drainage    Dressing Change Frequency PRN    Site / Wound Assessment Granulation tissue;Pink    % Wound base Red or Granulating 90%    % Wound base Other/Granulation Tissue (Comment) 10%    Undermining (cm) from 3-12 o clock    Margins Epibole (rolled edges)    Drainage Amount Minimal    Drainage Description Serosanguineous;Purulent    Treatment Cleansed;Debridement (Selective)    Selective Debridement - Location wound edges, wound bed    Selective Debridement - Tools Used Forceps    Selective Debridement - Tissue Removed devitalized tissue    Wound Therapy - Clinical Statement Edema noted throughout leg despite compression garment being worn. Upon inspection compression garment well worn and patient may need a new pair. Changed to profore lite because of worn compression garment, if tolerates well and edema continues, will change to profore. Most inferior wound with raised perimeter, massaged this are and white pus was expelled from the superior border of the wound, continued to press on this area until all pus was expelled and only serous fluid remained. Epibole edges also noted on this wound, attempted to open edges but painful on this date, will continue to work on opening edges in future session. Continued with xeroform followed by profore lite.    Wound Therapy - Functional Problem List walking    Factors Delaying/Impairing Wound Healing Diabetes Mellitus;Multiple medical problems    Hydrotherapy Plan Debridement;Dressing change;Patient/family education;Pulsatile lavage with suction    Wound Therapy - Frequency 2X / week    Wound Therapy - Current Recommendations PT    Wound Plan  dressing changes and debride PRN to promote wound healing    Dressing  vaseline, xeroform, 4x4, profore lite, netting #8                       PT Short Term Goals - 03/14/21 0825       PT SHORT TERM GOAL #1   Title Patient wound sizes to decrease 50% to decrease risk of infection.    Time 2    Period Weeks    Status New    Target Date 03/28/21  PT SHORT TERM GOAL #2   Title Patient to experience pain no more than 1/10 in wound area in order to show general improvement of condition     Time 2    Period Weeks    Status New    Target Date 03/28/21               PT Long Term Goals - 03/14/21 0826       PT LONG TERM GOAL #1   Title Patient to demonstrate full wound healing in order to show resolution of condition     Time 4    Period Weeks    Status Achieved    Target Date 04/11/21      PT LONG TERM GOAL #2   Title Patient and family to be independent in skin care strategies and edema control in order to assist in prevention of other wound formation     Time 4    Period Weeks    Status New    Target Date 04/11/21                    Patient will benefit from skilled therapeutic intervention in order to improve the following deficits and impairments:     Visit Diagnosis: Wound of right leg, subsequent encounter  Difficulty in walking, not elsewhere classified     Problem List Patient Active Problem List   Diagnosis Date Noted   Multiple myeloma in remission ([place]) 11/10/2017   CKD (chronic kidney disease) stage 3, GFR 30-59 ml/min ([place]) 62/95/2841   Chronic systolic CHF (congestive heart failure) ([place]) 11/10/2017   MRSA bacteremia 10/03/2017   Pressure injury of skin 08/04/2017   Fournier's gangrene of scrotum 04/14/2017   Hypogammaglobulinemia ([place]) 03/09/2016   Diaphragmatic hernia without obstruction 32/44/0102   Chronic systolic heart failure ([place]) 12/19/2013   Anemia, normocytic normochromic 10/09/2012   Chronic  pancreatitis ([place]) 10/07/2012   Atrial flutter ([place]) 09/17/2012   DDD (degenerative disc disease), cervical 03/18/2012   Arteriosclerotic cardiovascular disease (ASCVD)    Hypertension    Hyperlipidemia    Multiple myeloma ([place]) 07/01/2011   Morbid obesity ([place]) 04/29/2010   Insulin dependent diabetes mellitus 11/15/2008   OSA on CPAP 11/15/2008   5:08 PM, 03/18/21 Jerene Pitch, DPT Physical Therapy with [facility]  [phone] office   [place], [place], [address] Phone: [phone]   Fax:  [phone]  Name: [name] MRN: [id number] Date of Birth: 1961/01/20

## 2021-03-19 ENCOUNTER — Inpatient Hospital Stay (HOSPITAL_BASED_OUTPATIENT_CLINIC_OR_DEPARTMENT_OTHER): Payer: BC Managed Care – PPO | Admitting: Hematology

## 2021-03-19 ENCOUNTER — Encounter (HOSPITAL_COMMUNITY): Payer: Self-pay | Admitting: Hematology

## 2021-03-19 VITALS — Wt 378.0 lb

## 2021-03-19 DIAGNOSIS — B353 Tinea pedis: Secondary | ICD-10-CM | POA: Diagnosis not present

## 2021-03-19 DIAGNOSIS — R143 Flatulence: Secondary | ICD-10-CM | POA: Diagnosis not present

## 2021-03-19 DIAGNOSIS — E1142 Type 2 diabetes mellitus with diabetic polyneuropathy: Secondary | ICD-10-CM | POA: Diagnosis not present

## 2021-03-19 DIAGNOSIS — R197 Diarrhea, unspecified: Secondary | ICD-10-CM | POA: Diagnosis not present

## 2021-03-19 DIAGNOSIS — C9001 Multiple myeloma in remission: Secondary | ICD-10-CM

## 2021-03-19 DIAGNOSIS — L97521 Non-pressure chronic ulcer of other part of left foot limited to breakdown of skin: Secondary | ICD-10-CM | POA: Diagnosis not present

## 2021-03-19 NOTE — Progress Notes (Signed)
Virtual Visit via Telephone Note  I connected with [name] on 03/19/21 at  4:00 PM EDT by telephone and verified that I am speaking with the correct person using two identifiers.  Location: Patient: At home Provider: In the office   I discussed the limitations, risks, security and privacy concerns of performing an evaluation and management service by telephone and the availability of in person appointments. I also discussed with the patient that there may be a patient responsible charge related to this service. The patient expressed understanding and agreed to proceed.   History of Present Illness: He is seen in our office for history of multiple myeloma.  His immunofixation has been negative since 2017.  He also has CKD and iron deficiency.   Observations/Objective: Denies any bleeding per rectum or melena.  Appetite is 100%.  Energy levels are 50 to 75%.  He is taking iron tablet every other day.  Assessment and Plan:  1.  Multiple myeloma: - We have reviewed myeloma labs from 03/07/2021 done at [facility].  Immunofixation was negative.  SPEP shows M spike not observed. - Free kappa light chains are 110, lambda light chains 57, ratio 1.94.  LDH was 207. - Plan to repeat myeloma labs in 4 months.  2.  Normocytic anemia: - He is receiving Retacrit 10,000 units every other week under the direction of [name]. - Hemoglobin is staying between 9 and 10. - We reviewed ferritin which was 72, percent saturation 21 on 03/07/2021. - Recommend Feraheme x1, for target ferritin above 100 and percent saturation around 30. - He may get for him either at same place he gets Retacrit or in our infusion center, whichever is cheaper to him. - Plan to repeat ferritin and iron panel in 4 months.   Follow Up Instructions: RTC 4 months for follow-up.   I discussed the assessment and treatment plan with the patient. The patient was provided an opportunity to ask questions and all were answered.  The patient agreed with the plan and demonstrated an understanding of the instructions.   The patient was advised to call back or seek an in-person evaluation if the symptoms worsen or if the condition fails to improve as anticipated.  I provided 12 minutes of non-face-to-face time during this encounter.   Derek Jack, MD

## 2021-03-20 ENCOUNTER — Encounter (HOSPITAL_COMMUNITY): Payer: Self-pay | Admitting: Physical Therapy

## 2021-03-20 ENCOUNTER — Other Ambulatory Visit: Payer: Self-pay

## 2021-03-20 ENCOUNTER — Ambulatory Visit (HOSPITAL_COMMUNITY): Payer: BC Managed Care – PPO | Admitting: Physical Therapy

## 2021-03-20 DIAGNOSIS — R262 Difficulty in walking, not elsewhere classified: Secondary | ICD-10-CM | POA: Diagnosis not present

## 2021-03-20 DIAGNOSIS — S81801D Unspecified open wound, right lower leg, subsequent encounter: Secondary | ICD-10-CM

## 2021-03-20 NOTE — Therapy (Signed)
[place], [place], [address] Phone: [phone]   Fax:  [phone]  Wound Care Therapy  Patient Details  Name: [name] MRN: [id number] Date of Birth: 1960/07/02 Referring Provider (PT): [name] MD   Encounter Date: 03/20/2021   PT End of Session - 03/20/21 1136     Visit Number 3    Number of Visits 8    Date for PT Re-Evaluation 04/11/21    Authorization Type [employer] (no auth, no [id number])    PT Start Time 1136    PT Stop Time 1219    PT Time Calculation (min) 43 min    Activity Tolerance Patient tolerated treatment well    Behavior During Therapy [facility] for tasks assessed/performed             Past Medical History:  Diagnosis Date   Abscess 04/2017   Scrotal   Anemia    Arteriosclerotic cardiovascular disease (ASCVD)    MI-2000s; stent to the proximal LAD and diagonal in 2001; stress nuclear in 2008-impaired exercise capacity, left ventricular dilatation, moderately to severely depressed EF, apical, inferior and anteroseptal scar   Arthritis    Atrial flutter (HCC)    Bence-Jones proteinuria 05/05/2011   Cellulitis of leg    both legs   Chronic diarrhea    Chronic kidney disease, stage 3, mod decreased GFR (HCC)    Creatinine of 1.84 in 06/2011 and 1.5 in 07/2011   Diabetes mellitus    Insulin   Dysrhythmia    AFlutter   GERD (gastroesophageal reflux disease)    Gout    Hyperlipidemia    Hypertension    Injection site reaction    Multiple myeloma 07/01/2011   Myocardial infarction ([place]) 2000   Obesity    Pedal edema    Venous insufficiency   Sleep apnea    uses cpap   Ulcer     Past Surgical History:  Procedure Laterality Date   ABSCESS DRAINAGE     Scrotal   BIOPSY  01/02/2012   Procedure: BIOPSY;  Surgeon: Rogene Houston, MD;  [facility];  Service: Endoscopy;  Laterality: N/A;   BONE MARROW BIOPSY  05/13/11   CARDIAC CATHETERIZATION     cardiac stent    CARDIOVERSION N/A 10/13/2012   Procedure: CARDIOVERSION;  Surgeon: Yehuda Savannah, MD;  Location: [facility];  Service: Cardiovascular;  Laterality: N/A;   CATARACT EXTRACTION W/PHACO Left 02/13/2014   Procedure: CATARACT EXTRACTION PHACO AND INTRAOCULAR LENS PLACEMENT ([place]);  Surgeon: Tonny Branch, MD;  Location: [facility];  Service: Ophthalmology;  Laterality: Left;  CDE:  7.67   CATARACT EXTRACTION W/PHACO Right 03/02/2014   Procedure: CATARACT EXTRACTION PHACO AND INTRAOCULAR LENS PLACEMENT RIGHT EYE CDE=16.81;  Surgeon: Tonny Branch, MD;  Location: [facility];  Service: Ophthalmology;  Laterality: Right;   COLONOSCOPY  11/28/2011   Procedure: COLONOSCOPY;  Surgeon: Rogene Houston, MD;  [facility];  Service: Endoscopy;  Laterality: N/A;  [place] Right 12/16/2017   Procedure: CYSTOSCOPY WITH RIGHT RETROGRADE PYELOGRAM AND RIGHT URETERAL STENT REMOVAL;  Surgeon: Cleon Gustin, MD;  Location: [facility];  Service: Urology;  Laterality: Right;   CYSTOSCOPY W/ URETERAL STENT PLACEMENT Right 07/31/2017   Procedure: CYSTOSCOPY WITH RETROGRADE PYELOGRAM/URETERAL STENT PLACEMENT;  Surgeon: Cleon Gustin, MD;  [facility]: [facility];  Service: Urology;  Laterality: Right;   ESOPHAGOGASTRODUODENOSCOPY  01/02/2012   Procedure: ESOPHAGOGASTRODUODENOSCOPY (EGD);  Surgeon:  Rogene Houston, MD;  [facility];  Service: Endoscopy;  Laterality: N/A;  100   ESOPHAGOGASTRODUODENOSCOPY N/A 09/20/2012   Procedure: ESOPHAGOGASTRODUODENOSCOPY (EGD);  Surgeon: Rogene Houston, MD;  [facility];  Service: Endoscopy;  Laterality: N/A;   EUS N/A 10/07/2012   Procedure: UPPER ENDOSCOPIC ULTRASOUND (EUS) LINEAR;  Surgeon: Milus Banister, MD;  [facility];  Service: Endoscopy;  Laterality: N/A;   INCISION AND DRAINAGE ABSCESS N/A 04/14/2017   Procedure: INCISION AND DRAINAGE ABSCESS;  Surgeon: Ceasar Mons, MD;  [facility];  Service: Urology;  Laterality:  N/A;   INCISION AND DRAINAGE ABSCESS ANAL     IRRIGATION AND DEBRIDEMENT ABSCESS N/A 04/15/2017   Procedure: DEBRIDEMENT SCROTAL WOUND AND DRESSING CHANGE;  Surgeon: Ceasar Mons, MD;  [facility];  Service: Urology;  Laterality: N/A;   IRRIGATION AND DEBRIDEMENT ABSCESS N/A 04/17/2017   Procedure: IRRIGATION AND DEBRIDEMENT ABSCESS;  Surgeon: Ceasar Mons, MD;  [facility];  Service: Urology;  Laterality: N/A;  RM 3   LAPAROSCOPIC GASTRIC BANDING  2006   has been removed   OSTECTOMY Right 04/08/2017   Procedure: OSTECTOMY RIGHT GREAT TOE;  Surgeon: Caprice Beaver, DPM;  [facility];  Service: Podiatry;  Laterality: Right;   PORT-A-CATH REMOVAL Left 12/07/2012   Procedure: REMOVAL PORT-A-CATH;  Surgeon: Scherry Ran, MD;  Location: [facility];  Service: General;  Laterality: Left;   PORT-A-CATH REMOVAL N/A 10/02/2017   Procedure: MINOR REMOVAL PORT-A-CATH AT BEDSIDE;  Surgeon: Donnie Mesa, MD;  Location: [facility];  Service: General;  Laterality: N/A;   PORTACATH PLACEMENT  07/07/2011   Procedure: INSERTION PORT-A-CATH;  Surgeon: Scherry Ran, MD;  [facility]: [facility];  Service: General;  Laterality: N/A;   PORTACATH PLACEMENT N/A 12/07/2012   Procedure: INSERTION PORT-A-CATH;  Surgeon: Scherry Ran, MD;  Location: [facility];  Service: General;  Laterality: N/A;  Attempted portacath placement on left and right side   URETEROSCOPY Right 12/16/2017   Procedure: DIAGNOSTIC RIGHT URETEROSCOPY;  Surgeon: Cleon Gustin, MD;  Location: [facility];  Service: Urology;  Laterality: Right;   WOUND DEBRIDEMENT Right 04/08/2017   Procedure: EXCISION ULCERATION RIGHT GREAT TOE;  Surgeon: Caprice Beaver, DPM;  Location: [facility];  Service: Podiatry;  Laterality: Right;   WRIST SURGERY     Left; removal of bone fragment    There were no vitals filed for this visit.      [facility] PT Assessment - 03/20/21 0001       Assessment   Medical Diagnosis Open wound  of R lower Leg    [name]                     Wound Therapy - 03/20/21 0001     Subjective Leg feeling alright    Patient and Family Stated Goals wound to heal    Date of Onset 02/12/21    Prior Treatments compression, neosporin and other topicals    Pain Score 0-No pain    Evaluation and Treatment Procedures Explained to Patient/Family Yes    Evaluation and Treatment Procedures agreed to    Wound Properties Date First Assessed: 03/14/21 Time First Assessed: 0745 Wound Type: Skin tear Location: Pretibial Location Orientation: Distal;Right;Lateral Wound Description (Comments): lateral shin wound Present on Admission: Yes   Dressing Type Compression wrap;Impregnated gauze (bismuth)    Dressing Changed Changed    Dressing Status Old drainage  Dressing Change Frequency PRN    Site / Wound Assessment Granulation tissue    % Wound base Red or Granulating 100%    Margins Attached edges (approximated)    Drainage Amount Minimal    Drainage Description Serosanguineous    Treatment Cleansed;Debridement (Selective)    Wound Properties Date First Assessed: 03/14/21 Time First Assessed: 0745 Wound Type: Skin tear Location: Pretibial Location Orientation: Distal;Right;Medial Wound Description (Comments): medial shin wound Present on Admission: Yes   Dressing Type Gauze (Comment);Impregnated gauze (bismuth)    Dressing Changed Changed    Dressing Status Old drainage    Dressing Change Frequency PRN    Site / Wound Assessment Granulation tissue    % Wound base Red or Granulating 100%    Peri-wound Assessment Edema    Margins Attached edges (approximated)    Drainage Amount Minimal    Treatment Cleansed;Debridement (Selective)    Wound Properties Date First Assessed: 03/14/21 Time First Assessed: 0745 Wound Type: Skin tear Location: Pretibial Location Orientation: Distal;Right Wound Description (Comments): R shin wound inferior Present on Admission: Yes    Dressing Type Gauze (Comment);Impregnated gauze (bismuth)    Dressing Changed Changed    Dressing Status Old drainage    Dressing Change Frequency PRN    Site / Wound Assessment Granulation tissue    % Wound base Red or Granulating 90%    % Wound base Other/Granulation Tissue (Comment) 10%    Undermining (cm) from 12 to 4    Margins Epibole (rolled edges)    Drainage Amount Minimal    Drainage Description Serosanguineous    Treatment Cleansed;Debridement (Selective)    Selective Debridement - Location wound edges, wound bed    Selective Debridement - Tools Used Forceps    Selective Debridement - Tissue Removed devitalized tissue    Wound Therapy - Clinical Statement Patient with continued edema throughout RLE although appears to be reducing compared to evaluation. Patient wound beds with very thin layer of slough which is able to be removed from 2 superior wounds. Inferior wound with more slough and deeper than superior wounds with undermining from 12 to 4. Continued with xeroform and transitioned to profore regular to reduce LE edema. Will take measurements next session.    Wound Therapy - Functional Problem List walking    Factors Delaying/Impairing Wound Healing Diabetes Mellitus;Multiple medical problems    Hydrotherapy Plan Debridement;Dressing change;Patient/family education;Pulsatile lavage with suction    Wound Therapy - Frequency 2X / week    Wound Therapy - Current Recommendations PT    Wound Plan dressing changes and debride PRN to promote wound healing    Dressing  vaseline, xeroform, 4x4, profore, netting #8                       PT Short Term Goals - 03/14/21 0825       PT SHORT TERM GOAL #1   Title Patient wound sizes to decrease 50% to decrease risk of infection.    Time 2    Period Weeks    Status New    Target Date 03/28/21      PT SHORT TERM GOAL #2   Title Patient to experience pain no more than 1/10 in wound area in order to show general  improvement of condition     Time 2    Period Weeks    Status New    Target Date 03/28/21  PT Long Term Goals - 03/14/21 0826       PT LONG TERM GOAL #1   Title Patient to demonstrate full wound healing in order to show resolution of condition     Time 4    Period Weeks    Status Achieved    Target Date 04/11/21      PT LONG TERM GOAL #2   Title Patient and family to be independent in skin care strategies and edema control in order to assist in prevention of other wound formation     Time 4    Period Weeks    Status New    Target Date 04/11/21                   Plan - 03/20/21 1136     Clinical Impression Statement see above    Personal Factors and Comorbidities Comorbidity 3+    Comorbidities CKD, HTN, HLD, hx MI, CVD, DM, lymphedema    Examination-Activity Limitations Bathing;Locomotion Level;Transfers;Stand    Examination-Participation Restrictions Occupation;Community Activity    Stability/Clinical Decision Making Stable/Uncomplicated    Rehab Potential Good    PT Frequency 2x / week    PT Duration 4 weeks    PT Treatment/Interventions ADLs/Self Care Home Management;Cryotherapy;Electrical Stimulation;Moist Heat;Gait training;Stair training;Functional mobility training;Therapeutic activities;Therapeutic exercise;Balance training;Neuromuscular re-education;Patient/family education;Manual lymph drainage;Manual techniques;Compression bandaging;Scar mobilization    PT Next Visit Plan change dressings and debride PRN to promote wound healing    Consulted and Agree with Plan of Care Patient             Patient will benefit from skilled therapeutic intervention in order to improve the following deficits and impairments:  Abnormal gait, Difficulty walking, Decreased skin integrity  Visit Diagnosis: Wound of right leg, subsequent encounter  Difficulty in walking, not elsewhere classified     Problem List Patient Active Problem List    Diagnosis Date Noted   Multiple myeloma in remission ([place]) 11/10/2017   CKD (chronic kidney disease) stage 3, GFR 30-59 ml/min (HCC) 30/03/4044   Chronic systolic CHF (congestive heart failure) ([place]) 11/10/2017   MRSA bacteremia 10/03/2017   Pressure injury of skin 08/04/2017   Fournier's gangrene of scrotum 04/14/2017   Hypogammaglobulinemia ([place]) 03/09/2016   Diaphragmatic hernia without obstruction 91/36/8599   Chronic systolic heart failure ([place]) 12/19/2013   Anemia, normocytic normochromic 10/09/2012   Chronic pancreatitis ([place]) 10/07/2012   Atrial flutter ([place]) 09/17/2012   DDD (degenerative disc disease), cervical 03/18/2012   Arteriosclerotic cardiovascular disease (ASCVD)    Hypertension    Hyperlipidemia    Multiple myeloma ([place]) 07/01/2011   Morbid obesity ([place]) 04/29/2010   Insulin dependent diabetes mellitus 11/15/2008   OSA on CPAP 11/15/2008    12:31 PM, 03/20/21 Mearl Latin PT, DPT Physical Therapist at [facility] [place], [place], [address] Phone: [phone]   Fax:  [phone]  Name: [name] MRN: [id number] Date of Birth: 03-Sep-1960

## 2021-03-21 NOTE — Progress Notes (Signed)
Intravenous Iron Formulation Change  [name] has insurance that requires a change in intravenous iron product from Feraheme to Venofer. Orders have been updated to reflect this change and scheduling message sent to adjust infusion appointments. Dr Delton Coombes notified and agrees with the plan.  Allergies:  Allergies  Allergen Reactions   Beef-Derived Products Diarrhea   Pork-Derived Products Diarrhea   Other     Band aids     The plan for iron therapy is as follows: Venofer 400mg  IVPB plus premedication x 1 dose  [name] 03/21/2021

## 2021-03-23 DIAGNOSIS — G4733 Obstructive sleep apnea (adult) (pediatric): Secondary | ICD-10-CM | POA: Diagnosis not present

## 2021-03-25 ENCOUNTER — Encounter (HOSPITAL_COMMUNITY): Payer: Self-pay | Admitting: Physical Therapy

## 2021-03-25 ENCOUNTER — Inpatient Hospital Stay (HOSPITAL_COMMUNITY): Payer: BC Managed Care – PPO

## 2021-03-25 ENCOUNTER — Ambulatory Visit (HOSPITAL_COMMUNITY): Payer: BC Managed Care – PPO | Admitting: Physical Therapy

## 2021-03-25 ENCOUNTER — Other Ambulatory Visit: Payer: Self-pay

## 2021-03-25 VITALS — BP 140/76 | HR 53 | Temp 96.7°F | Resp 20

## 2021-03-25 DIAGNOSIS — N183 Chronic kidney disease, stage 3 unspecified: Secondary | ICD-10-CM

## 2021-03-25 DIAGNOSIS — D649 Anemia, unspecified: Secondary | ICD-10-CM

## 2021-03-25 DIAGNOSIS — S81801D Unspecified open wound, right lower leg, subsequent encounter: Secondary | ICD-10-CM

## 2021-03-25 DIAGNOSIS — R262 Difficulty in walking, not elsewhere classified: Secondary | ICD-10-CM | POA: Diagnosis not present

## 2021-03-25 MED ORDER — SODIUM CHLORIDE 0.9 % IV SOLN
400.0000 mg | Freq: Once | INTRAVENOUS | Status: AC
  Administered 2021-03-25: 400 mg via INTRAVENOUS
  Filled 2021-03-25: qty 20

## 2021-03-25 MED ORDER — SODIUM CHLORIDE 0.9 % IV SOLN: Freq: Once | INTRAVENOUS | Status: AC

## 2021-03-25 MED ORDER — EPOETIN ALFA-EPBX 10000 UNIT/ML IJ SOLN: 10000.0000 [IU] | Freq: Once | INTRAMUSCULAR | Status: DC

## 2021-03-25 MED ORDER — ACETAMINOPHEN 325 MG PO TABS
650.0000 mg | ORAL_TABLET | Freq: Once | ORAL | Status: AC
  Administered 2021-03-25: 650 mg via ORAL
  Filled 2021-03-25: qty 2

## 2021-03-25 MED ORDER — SODIUM CHLORIDE 0.9 % IV SOLN: INTRAVENOUS | Status: DC

## 2021-03-25 MED ORDER — LORATADINE 10 MG PO TABS
10.0000 mg | ORAL_TABLET | Freq: Once | ORAL | Status: AC
  Administered 2021-03-25: 10 mg via ORAL
  Filled 2021-03-25: qty 1

## 2021-03-25 NOTE — Progress Notes (Signed)
Patient presents today for iron infusion.  Patient is in satisfactory condition with minimal complaints voiced. BP slightly elevated, but all other labs are normal.  We will proceed with treatment per MD orders.   1005:  BP recheck= 156/83.  BP has improved.  Patient tolerated treatment well with no complaints voiced.  Patient left via wheelchair in stable condition.  Vital signs stable at discharge.  Follow up as scheduled.

## 2021-03-25 NOTE — Therapy (Signed)
[place], [place], [address] Phone: [phone]   Fax:  [phone]  Wound Care Therapy  Patient Details  Name: [name] MRN: [id number] Date of Birth: July 18, 1960 Referring Provider (PT): [name] MD   Encounter Date: 03/25/2021   PT End of Session - 03/25/21 1409     Visit Number 4    Number of Visits 8    Date for PT Re-Evaluation 04/11/21    Authorization Type [employer] (no auth, no [id number])    PT Start Time 1409    PT Stop Time 1455    PT Time Calculation (min) 46 min    Activity Tolerance Patient tolerated treatment well    Behavior During Therapy [facility] for tasks assessed/performed             Past Medical History:  Diagnosis Date   Abscess 04/2017   Scrotal   Anemia    Arteriosclerotic cardiovascular disease (ASCVD)    MI-2000s; stent to the proximal LAD and diagonal in 2001; stress nuclear in 2008-impaired exercise capacity, left ventricular dilatation, moderately to severely depressed EF, apical, inferior and anteroseptal scar   Arthritis    Atrial flutter (HCC)    Bence-Jones proteinuria 05/05/2011   Cellulitis of leg    both legs   Chronic diarrhea    Chronic kidney disease, stage 3, mod decreased GFR (HCC)    Creatinine of 1.84 in 06/2011 and 1.5 in 07/2011   Diabetes mellitus    Insulin   Dysrhythmia    AFlutter   GERD (gastroesophageal reflux disease)    Gout    Hyperlipidemia    Hypertension    Injection site reaction    Multiple myeloma 07/01/2011   Myocardial infarction ([place]) 2000   Obesity    Pedal edema    Venous insufficiency   Sleep apnea    uses cpap   Ulcer     Past Surgical History:  Procedure Laterality Date   ABSCESS DRAINAGE     Scrotal   BIOPSY  01/02/2012   Procedure: BIOPSY;  Surgeon: Rogene Houston, MD;  [facility];  Service: Endoscopy;  Laterality: N/A;   BONE MARROW BIOPSY  05/13/11   CARDIAC CATHETERIZATION     cardiac stent    CARDIOVERSION N/A 10/13/2012   Procedure: CARDIOVERSION;  Surgeon: Yehuda Savannah, MD;  Location: [facility] OR[facility];  Service: Cardiovascular;  Laterality: N/A;   CATARACT EXTRACTION W/PHACO Left 02/13/2014   Procedure: CATARACT EXTRACTION PHACO AND INTRAOCULAR LENS PLACEMENT ([place]);  Surgeon: Tonny Branch, MD;  Location: [facility];  Service: Ophthalmology;  Laterality: Left;  CDE:  7.67   CATARACT EXTRACTION W/PHACO Right 03/02/2014   Procedure: CATARACT EXTRACTION PHACO AND INTRAOCULAR LENS PLACEMENT RIGHT EYE CDE=16.81;  Surgeon: Tonny Branch, MD;  Location: [facility];  Service: Ophthalmology;  Laterality: Right;   COLONOSCOPY  11/28/2011   Procedure: COLONOSCOPY;  Surgeon: Rogene Houston, MD;  [facility];  Service: Endoscopy;  Laterality: N/A;  [place] Right 12/16/2017   Procedure: CYSTOSCOPY WITH RIGHT RETROGRADE PYELOGRAM AND RIGHT URETERAL STENT REMOVAL;  Surgeon: Cleon Gustin, MD;  Location: [facility];  Service: Urology;  Laterality: Right;   CYSTOSCOPY W/ URETERAL STENT PLACEMENT Right 07/31/2017   Procedure: CYSTOSCOPY WITH RETROGRADE PYELOGRAM/URETERAL STENT PLACEMENT;  Surgeon: Cleon Gustin, MD;  [facility];  Service: Urology;  Laterality: Right;   ESOPHAGOGASTRODUODENOSCOPY  01/02/2012   Procedure: ESOPHAGOGASTRODUODENOSCOPY (EGD);  Surgeon:  Rogene Houston, MD;  [facility];  Service: Endoscopy;  Laterality: N/A;  100   ESOPHAGOGASTRODUODENOSCOPY N/A 09/20/2012   Procedure: ESOPHAGOGASTRODUODENOSCOPY (EGD);  Surgeon: Rogene Houston, MD;  [facility];  Service: Endoscopy;  Laterality: N/A;   EUS N/A 10/07/2012   Procedure: UPPER ENDOSCOPIC ULTRASOUND (EUS) LINEAR;  Surgeon: Milus Banister, MD;  [facility];  Service: Endoscopy;  Laterality: N/A;   INCISION AND DRAINAGE ABSCESS N/A 04/14/2017   Procedure: INCISION AND DRAINAGE ABSCESS;  Surgeon: Ceasar Mons, MD;  [facility];  Service: Urology;  Laterality:  N/A;   INCISION AND DRAINAGE ABSCESS ANAL     IRRIGATION AND DEBRIDEMENT ABSCESS N/A 04/15/2017   Procedure: DEBRIDEMENT SCROTAL WOUND AND DRESSING CHANGE;  Surgeon: Ceasar Mons, MD;  [facility];  Service: Urology;  Laterality: N/A;   IRRIGATION AND DEBRIDEMENT ABSCESS N/A 04/17/2017   Procedure: IRRIGATION AND DEBRIDEMENT ABSCESS;  Surgeon: Ceasar Mons, MD;  [facility];  Service: Urology;  Laterality: N/A;  RM 3   LAPAROSCOPIC GASTRIC BANDING  2006   has been removed   OSTECTOMY Right 04/08/2017   Procedure: OSTECTOMY RIGHT GREAT TOE;  Surgeon: Caprice Beaver, DPM;  [facility];  Service: Podiatry;  Laterality: Right;   PORT-A-CATH REMOVAL Left 12/07/2012   Procedure: REMOVAL PORT-A-CATH;  Surgeon: Scherry Ran, MD;  Location[facility];  Service: General;  Laterality: Left;   PORT-A-CATH REMOVAL N/A 10/02/2017   Procedure: MINOR REMOVAL PORT-A-CATH AT BEDSIDE;  Surgeon: Donnie Mesa, MD;  Location: [place];  Service: General;  Laterality: N/A;   PORTACATH PLACEMENT  07/07/2011   Procedure: INSERTION PORT-A-CATH;  Surgeon: Scherry Ran, MD;  [facility];  Service: General;  Laterality: N/A;   PORTACATH PLACEMENT N/A 12/07/2012   Procedure: INSERTION PORT-A-CATH;  Surgeon: Scherry Ran, MD;  Location: [facility];  Service: General;  Laterality: N/A;  Attempted portacath placement on left and right side   URETEROSCOPY Right 12/16/2017   Procedure: DIAGNOSTIC RIGHT URETEROSCOPY;  Surgeon: Cleon Gustin, MD;  Location: [facility];  Service: Urology;  Laterality: Right;   WOUND DEBRIDEMENT Right 04/08/2017   Procedure: EXCISION ULCERATION RIGHT GREAT TOE;  Surgeon: Caprice Beaver, DPM;  Location: [facility];  Service: Podiatry;  Laterality: Right;   WRIST SURGERY     Left; removal of bone fragment    There were no vitals filed for this visit.               Wound Therapy - 03/25/21 1500     Subjective Leg hasnt been hurting.     Patient and Family Stated Goals wound to heal    Date of Onset 02/12/21    Prior Treatments compression, neosporin and other topicals    Pain Score 0-No pain    Evaluation and Treatment Procedures Explained to Patient/Family Yes    Evaluation and Treatment Procedures agreed to    Wound Properties Date First Assessed: 03/14/21 Time First Assessed: 0745 Wound Type: Skin tear Location: Pretibial Location Orientation: Distal;Right;Lateral Wound Description (Comments): lateral shin wound Present on Admission: Yes   Wound Image Images linked: 1    Dressing Type Compression wrap;Impregnated gauze (bismuth)    Dressing Changed Changed    Dressing Status Old drainage    Dressing Change Frequency PRN    Site / Wound Assessment Granulation tissue    Wound Length (cm) 1 cm    Wound Width (cm) 1.9 cm    Wound  Surface Area (cm^2) 1.9 cm^2    Margins Attached edges (approximated)    Drainage Amount Minimal    Drainage Description Serosanguineous    Treatment Cleansed;Debridement (Selective)    Wound Properties Date First Assessed: 03/14/21 Time First Assessed: 0745 Wound Type: Skin tear Location: Pretibial Location Orientation: Distal;Right;Medial Wound Description (Comments): medial shin wound Present on Admission: Yes   Dressing Type Compression wrap;Impregnated gauze (bismuth)    Dressing Changed Changed    Dressing Status Old drainage    Dressing Change Frequency PRN    Site / Wound Assessment Granulation tissue    % Wound base Red or Granulating 100%    Wound Length (cm) 0.5 cm    Wound Width (cm) 1.5 cm    Wound Surface Area (cm^2) 0.75 cm^2    Margins Attached edges (approximated)    Drainage Amount Minimal    Drainage Description Serosanguineous    Treatment Cleansed;Debridement (Selective)    Wound Properties Date First Assessed: 03/14/21 Time First Assessed: 0745 Wound Type: Skin tear Location: Pretibial Location Orientation: Distal;Right Wound Description (Comments): R shin wound  inferior Present on Admission: Yes   Dressing Type Compression wrap;Impregnated gauze (bismuth)    Dressing Changed Changed    Dressing Status Old drainage    Dressing Change Frequency PRN    Site / Wound Assessment Granulation tissue    % Wound base Red or Granulating 90%    % Wound base Other/Granulation Tissue (Comment) 10%    Wound Length (cm) 0.8 cm    Wound Width (cm) 0.5 cm    Wound Surface Area (cm^2) 0.4 cm^2    Undermining (cm) from 12 - 4    Margins Epibole (rolled edges)    Drainage Amount Minimal    Drainage Description Serosanguineous    Treatment Cleansed;Debridement (Selective)    Selective Debridement - Location wound edges, wound bed    Selective Debridement - Tools Used Forceps    Selective Debridement - Tissue Removed devitalized tissue    Wound Therapy - Clinical Statement Patient's superior wounds have decreased in size and are 100% granulation. Inferior wound slightly larger in size as margins have been able to be debrided. Patient tolerates debridement well. Continued with same dressings as wounds appear to be healing well at this time.    Wound Therapy - Functional Problem List walking    Factors Delaying/Impairing Wound Healing Diabetes Mellitus;Multiple medical problems    Hydrotherapy Plan Debridement;Dressing change;Patient/family education;Pulsatile lavage with suction    Wound Therapy - Frequency 2X / week    Wound Therapy - Current Recommendations PT    Wound Plan dressing changes and debride PRN to promote wound healing    Dressing  vaseline, xeroform, 4x4, profore, netting #8                       PT Short Term Goals - 03/14/21 0825       PT SHORT TERM GOAL #1   Title Patient wound sizes to decrease 50% to decrease risk of infection.    Time 2    Period Weeks    Status New    Target Date 03/28/21      PT SHORT TERM GOAL #2   Title Patient to experience pain no more than 1/10 in wound area in order to show general improvement of  condition     Time 2    Period Weeks    Status New    Target Date 03/28/21  PT Long Term Goals - 03/14/21 0826       PT LONG TERM GOAL #1   Title Patient to demonstrate full wound healing in order to show resolution of condition     Time 4    Period Weeks    Status Achieved    Target Date 04/11/21      PT LONG TERM GOAL #2   Title Patient and family to be independent in skin care strategies and edema control in order to assist in prevention of other wound formation     Time 4    Period Weeks    Status New    Target Date 04/11/21                   Plan - 03/25/21 1409     Clinical Impression Statement see above    Personal Factors and Comorbidities Comorbidity 3+    Comorbidities CKD, HTN, HLD, hx MI, CVD, DM, lymphedema    Examination-Activity Limitations Bathing;Locomotion Level;Transfers;Stand    Examination-Participation Restrictions Occupation;Community Activity    Stability/Clinical Decision Making Stable/Uncomplicated    Rehab Potential Good    PT Frequency 2x / week    PT Duration 4 weeks    PT Treatment/Interventions ADLs/Self Care Home Management;Cryotherapy;Electrical Stimulation;Moist Heat;Gait training;Stair training;Functional mobility training;Therapeutic activities;Therapeutic exercise;Balance training;Neuromuscular re-education;Patient/family education;Manual lymph drainage;Manual techniques;Compression bandaging;Scar mobilization    PT Next Visit Plan change dressings and debride PRN to promote wound healing    Consulted and Agree with Plan of Care Patient             Patient will benefit from skilled therapeutic intervention in order to improve the following deficits and impairments:  Abnormal gait, Difficulty walking, Decreased skin integrity  Visit Diagnosis: Wound of right leg, subsequent encounter  Difficulty in walking, not elsewhere classified     Problem List Patient Active Problem List   Diagnosis Date  Noted   Multiple myeloma in remission ([place]) 11/10/2017   CKD (chronic kidney disease) stage 3, GFR 30-59 ml/min (HCC) 68/37/2902   Chronic systolic CHF (congestive heart failure) ([place]) 11/10/2017   MRSA bacteremia 10/03/2017   Pressure injury of skin 08/04/2017   Fournier's gangrene of scrotum 04/14/2017   Hypogammaglobulinemia ([place]) 03/09/2016   Diaphragmatic hernia without obstruction 04/17/5207   Chronic systolic heart failure ([place]) 12/19/2013   Anemia, normocytic normochromic 10/09/2012   Chronic pancreatitis ([place]) 10/07/2012   Atrial flutter ([place]) 09/17/2012   DDD (degenerative disc disease), cervical 03/18/2012   Arteriosclerotic cardiovascular disease (ASCVD)    Hypertension    Hyperlipidemia    Multiple myeloma ([place]) 07/01/2011   Morbid obesity ([place]) 04/29/2010   Insulin dependent diabetes mellitus 11/15/2008   OSA on CPAP 11/15/2008    3:06 PM, 03/25/21 Mearl Latin PT, DPT Physical Therapist at [place], [place], [address] Phone: [phone]   Fax:  [phone]  Name: [name] MRN: [id number] Date of Birth: 16-Jul-1960

## 2021-03-25 NOTE — Patient Instructions (Signed)
[facility]  Discharge Instructions: Thank you for choosing [facility] to provide your oncology and hematology care.  If you have a lab appointment with the Cancer Center, please come in thru the Main Entrance and check in at the main information desk.  Wear comfortable clothing and clothing appropriate for easy access to any Portacath or PICC line.   We strive to give you quality time with your provider. You may need to reschedule your appointment if you arrive late (15 or more minutes).  Arriving late affects you and other patients whose appointments are after yours.  Also, if you miss three or more appointments without notifying the office, you may be dismissed from the clinic at the provider's discretion.      For prescription refill requests, have your pharmacy contact our office and allow 72 hours for refills to be completed.        To help prevent nausea and vomiting after your treatment, we encourage you to take your nausea medication as directed.  BELOW ARE SYMPTOMS THAT SHOULD BE REPORTED IMMEDIATELY: *FEVER GREATER THAN 100.4 F (38 C) OR HIGHER *CHILLS OR SWEATING *NAUSEA AND VOMITING THAT IS NOT CONTROLLED WITH YOUR NAUSEA MEDICATION *UNUSUAL SHORTNESS OF BREATH *UNUSUAL BRUISING OR BLEEDING *URINARY PROBLEMS (pain or burning when urinating, or frequent urination) *BOWEL PROBLEMS (unusual diarrhea, constipation, pain near the anus) TENDERNESS IN MOUTH AND THROAT WITH OR WITHOUT PRESENCE OF ULCERS (sore throat, sores in mouth, or a toothache) UNUSUAL RASH, SWELLING OR PAIN  UNUSUAL VAGINAL DISCHARGE OR ITCHING   Items with * indicate a potential emergency and should be followed up as soon as possible or go to the Emergency Department if any problems should occur.  Please show the CHEMOTHERAPY ALERT CARD or IMMUNOTHERAPY ALERT CARD at check-in to the Emergency Department and triage nurse.  Should you have questions after your visit or need to cancel  or reschedule your appointment, please contact [facility] [phone]  and follow the prompts.  Office hours are 8:00 a.m. to 4:30 p.m. Monday - Friday. Please note that voicemails left after 4:00 p.m. may not be returned until the following business day.  We are closed weekends and major holidays. You have access to a nurse at all times for urgent questions. Please call the main number to the clinic [phone] and follow the prompts.  For any non-urgent questions, you may also contact your provider using MyChart. We now offer e-Visits for anyone 18 and older to request care online for non-urgent symptoms. For details visit [email or web address].   Also download the MyChart app! Go to the app store, search "MyChart", open the app, select [facility], and log in with your MyChart username and password.  Due to Covid, a mask is required upon entering the hospital/clinic. If you do not have a mask, one will be given to you upon arrival. For doctor visits, patients may have 1 support person aged 18 or older with them. For treatment visits, patients cannot have anyone with them due to current Covid guidelines and our immunocompromised population.  

## 2021-03-26 ENCOUNTER — Encounter (HOSPITAL_COMMUNITY)
Admission: RE | Admit: 2021-03-26 | Discharge: 2021-03-26 | Disposition: A | Discharge status: Home / Self Care | Payer: BC Managed Care – PPO | Source: Ambulatory Visit | Attending: Nephrology | Admitting: Nephrology

## 2021-03-26 DIAGNOSIS — D631 Anemia in chronic kidney disease: Secondary | ICD-10-CM | POA: Diagnosis not present

## 2021-03-26 DIAGNOSIS — N184 Chronic kidney disease, stage 4 (severe): Secondary | ICD-10-CM | POA: Diagnosis not present

## 2021-03-26 LAB — POCT HEMOGLOBIN-HEMACUE: Hemoglobin: 10 g/dL — ABNORMAL LOW (ref 13.0–17.0)

## 2021-03-26 MED ORDER — EPOETIN ALFA-EPBX 10000 UNIT/ML IJ SOLN: 10000.0000 [IU] | Freq: Once | INTRAMUSCULAR | Status: DC

## 2021-03-28 ENCOUNTER — Other Ambulatory Visit: Payer: Self-pay

## 2021-03-28 ENCOUNTER — Ambulatory Visit (HOSPITAL_COMMUNITY): Payer: BC Managed Care – PPO | Admitting: Physical Therapy

## 2021-03-28 DIAGNOSIS — S81801D Unspecified open wound, right lower leg, subsequent encounter: Secondary | ICD-10-CM | POA: Diagnosis not present

## 2021-03-28 DIAGNOSIS — R262 Difficulty in walking, not elsewhere classified: Secondary | ICD-10-CM | POA: Diagnosis not present

## 2021-03-28 NOTE — Therapy (Signed)
[place], [place], [address] Phone: [phone]   Fax:  [phone]  Wound Care Therapy  Patient Details  Name: [name] MRN: [id number] Date of Birth: [birth year] Referring Provider (PT): [name] MD   Encounter Date: 03/28/2021   PT End of Session - 03/28/21 1221     Visit Number 5    Number of Visits 8    Date for PT Re-Evaluation 04/11/21    Authorization Type BCBS (no auth, no 30VL)    PT Start Time 1135    PT Stop Time 1210    PT Time Calculation (min) 35 min    Activity Tolerance Patient tolerated treatment well    Behavior During Therapy [facility] for tasks assessed/performed             Past Medical History:  Diagnosis Date   Abscess 04/2017   Scrotal   Anemia    Arteriosclerotic cardiovascular disease (ASCVD)    MI-2000s; stent to the proximal LAD and diagonal in 2001; stress nuclear in 2008-impaired exercise capacity, left ventricular dilatation, moderately to severely depressed EF, apical, inferior and anteroseptal scar   Arthritis    Atrial flutter (HCC)    Bence-Jones proteinuria 05/05/2011   Cellulitis of leg    both legs   Chronic diarrhea    Chronic kidney disease, stage 3, mod decreased GFR (HCC)    Creatinine of 1.84 in 06/2011 and 1.5 in 07/2011   Diabetes mellitus    Insulin   Dysrhythmia    AFlutter   GERD (gastroesophageal reflux disease)    Gout    Hyperlipidemia    Hypertension    Injection site reaction    Multiple myeloma 07/01/2011   Myocardial infarction ([place]) 2000   Obesity    Pedal edema    Venous insufficiency   Sleep apnea    uses cpap   Ulcer     Past Surgical History:  Procedure Laterality Date   ABSCESS DRAINAGE     Scrotal   BIOPSY  01/02/2012   Procedure: BIOPSY;  Surgeon: Rogene Houston, MD;  [facility];  Service: Endoscopy;  Laterality: N/A;   BONE MARROW BIOPSY  05/13/11   CARDIAC CATHETERIZATION     cardiac stent    CARDIOVERSION N/A 10/13/2012   Procedure: CARDIOVERSION;  Surgeon: Yehuda Savannah, MD;  Location: [facility];  Service: Cardiovascular;  Laterality: N/A;   CATARACT EXTRACTION W/PHACO Left 02/13/2014   Procedure: CATARACT EXTRACTION PHACO AND INTRAOCULAR LENS PLACEMENT ([place]);  Surgeon: Tonny Branch, MD;  Location: [facility];  Service: Ophthalmology;  Laterality: Left;  CDE:  7.67   CATARACT EXTRACTION W/PHACO Right 03/02/2014   Procedure: CATARACT EXTRACTION PHACO AND INTRAOCULAR LENS PLACEMENT RIGHT EYE CDE=16.81;  Surgeon: Tonny Branch, MD;  [facility]: [facility];  Service: Ophthalmology;  Laterality: Right;   COLONOSCOPY  11/28/2011   Procedure: COLONOSCOPY;  Surgeon: Rogene Houston, MD;  [facility];  Service: Endoscopy;  Laterality: N/A;  [place] Right 12/16/2017   Procedure: CYSTOSCOPY WITH RIGHT RETROGRADE PYELOGRAM AND RIGHT URETERAL STENT REMOVAL;  Surgeon: Cleon Gustin, MD;  [facility]: [facility];  Service: Urology;  Laterality: Right;   CYSTOSCOPY W/ URETERAL STENT PLACEMENT Right 07/31/2017   Procedure: CYSTOSCOPY WITH RETROGRADE PYELOGRAM/URETERAL STENT PLACEMENT;  Surgeon: Cleon Gustin, MD;  [facility];  Service: Urology;  Laterality: Right;   ESOPHAGOGASTRODUODENOSCOPY  01/02/2012   Procedure: ESOPHAGOGASTRODUODENOSCOPY (EGD);  Surgeon:  Rogene Houston, MD;  [facility];  Service: Endoscopy;  Laterality: N/A;  100   ESOPHAGOGASTRODUODENOSCOPY N/A 09/20/2012   Procedure: ESOPHAGOGASTRODUODENOSCOPY (EGD);  Surgeon: Rogene Houston, MD;  [facility];  Service: Endoscopy;  Laterality: N/A;   EUS N/A 10/07/2012   Procedure: UPPER ENDOSCOPIC ULTRASOUND (EUS) LINEAR;  Surgeon: Milus Banister, MD;  [facility];  Service: Endoscopy;  Laterality: N/A;   INCISION AND DRAINAGE ABSCESS N/A 04/14/2017   Procedure: INCISION AND DRAINAGE ABSCESS;  Surgeon: Ceasar Mons, MD;  [facility];  Service: Urology;  Laterality:  N/A;   INCISION AND DRAINAGE ABSCESS ANAL     IRRIGATION AND DEBRIDEMENT ABSCESS N/A 04/15/2017   Procedure: DEBRIDEMENT SCROTAL WOUND AND DRESSING CHANGE;  Surgeon: Ceasar Mons, MD;  [facility];  Service: Urology;  Laterality: N/A;   IRRIGATION AND DEBRIDEMENT ABSCESS N/A 04/17/2017   Procedure: IRRIGATION AND DEBRIDEMENT ABSCESS;  Surgeon: Ceasar Mons, MD;  [facility];  Service: Urology;  Laterality: N/A;  RM 3   LAPAROSCOPIC GASTRIC BANDING  2006   has been removed   OSTECTOMY Right 04/08/2017   Procedure: OSTECTOMY RIGHT GREAT TOE;  Surgeon: Caprice Beaver, DPM;  [facility];  Service: Podiatry;  Laterality: Right;   PORT-A-CATH REMOVAL Left 12/07/2012   Procedure: REMOVAL PORT-A-CATH;  Surgeon: Scherry Ran, MD;  [facility];  Service: General;  Laterality: Left;   PORT-A-CATH REMOVAL N/A 10/02/2017   Procedure: MINOR REMOVAL PORT-A-CATH AT BEDSIDE;  Surgeon: Donnie Mesa, MD;  Location: [place];  Service: General;  Laterality: N/A;   PORTACATH PLACEMENT  07/07/2011   Procedure: INSERTION PORT-A-CATH;  Surgeon: Scherry Ran, MD;  [facility];  Service: General;  Laterality: N/A;   PORTACATH PLACEMENT N/A 12/07/2012   Procedure: INSERTION PORT-A-CATH;  Surgeon: Scherry Ran, MD;  [facility];  Service: General;  Laterality: N/A;  Attempted portacath placement on left and right side   URETEROSCOPY Right 12/16/2017   Procedure: DIAGNOSTIC RIGHT URETEROSCOPY;  Surgeon: Cleon Gustin, MD;  [facility]: [facility];  Service: Urology;  Laterality: Right;   WOUND DEBRIDEMENT Right 04/08/2017   Procedure: EXCISION ULCERATION RIGHT GREAT TOE;  Surgeon: Caprice Beaver, DPM;  [facility]: [facility];  Service: Podiatry;  Laterality: Right;   WRIST SURGERY     Left; removal of bone fragment    There were no vitals filed for this visit.               Wound Therapy - 03/28/21 0001     Subjective Leg hasnt been hurting.     Patient and Family Stated Goals wound to heal    Date of Onset 02/12/21    Prior Treatments compression, neosporin and other topicals    Pain Scale 0-10    Pain Score 0-No pain    Evaluation and Treatment Procedures Explained to Patient/Family Yes    Evaluation and Treatment Procedures agreed to    Wound Properties Date First Assessed: 03/14/21 Time First Assessed: 0745 Wound Type: Skin tear Location: Pretibial Location Orientation: Distal;Right;Lateral Wound Description (Comments): lateral shin wound Present on Admission: Yes   Dressing Type Bismuth petroleum;Compression wrap    Dressing Changed Changed    Dressing Status Old drainage    Dressing Change Frequency PRN    Site / Wound Assessment Granulation tissue    % Wound base Red or Granulating 100%    Wound Length (cm) 0.3 cm    Wound Width (  cm) 1.3 cm    Wound Surface Area (cm^2) 0.39 cm^2    Drainage Amount Scant    Drainage Description Serous    Treatment Cleansed    Wound Properties Date First Assessed: 03/14/21 Time First Assessed: 0745 Wound Type: Skin tear Location: Pretibial Location Orientation: Distal;Right;Medial Wound Description (Comments): medial shin wound Present on Admission: Yes   Dressing Changed Changed    Dressing Status Old drainage    Dressing Change Frequency PRN    Site / Wound Assessment Granulation tissue    % Wound base Red or Granulating 100%    Wound Length (cm) 0.5 cm    Wound Width (cm) 1 cm    Wound Surface Area (cm^2) 0.5 cm^2    Drainage Amount Scant    Drainage Description Serous    Treatment Cleansed    Wound Properties Date First Assessed: 03/14/21 Time First Assessed: 0745 Wound Type: Skin tear Location: Pretibial Location Orientation: Distal;Right Wound Description (Comments): R shin wound inferior Present on Admission: Yes   Dressing Type Bismuth petroleum;Compression wrap    Dressing Changed Changed    Dressing Status Old drainage    Dressing Change Frequency PRN    % Wound base  Red or Granulating 100%    Wound Length (cm) 0.9 cm    Wound Width (cm) 0.6 cm    Wound Surface Area (cm^2) 0.54 cm^2    Margins Epibole (rolled edges)    Drainage Amount Scant    Drainage Description Serous    Treatment Cleansed;Other (Comment)   retro manual techniques to improve edema and circulation   Selective Debridement - Location --    Selective Debridement - Tools Used --    Selective Debridement - Tissue Removed --    Wound Therapy - Clinical Statement PT has increased edema, retro grade manual techniques used to improve circulation.  Pt states that his compression stockings are 62 years old.  Therapist measured LT LE and gave pt information on elastic therapy stressing that he should get new compression garments every six months.    Wound Therapy - Functional Problem List walking    Factors Delaying/Impairing Wound Healing Diabetes Mellitus;Multiple medical problems    Hydrotherapy Plan Debridement;Dressing change;Patient/family education;Pulsatile lavage with suction    Wound Therapy - Frequency 2X / week    Wound Therapy - Current Recommendations PT    Wound Plan dressing changes, manual and debride PRN to promote wound healing    Dressing  vaseline, xeroform, 4x4, profore, netting #5                     PT Education - 03/28/21 1220     Education Details The need to change compression every six months(measured and gave information for elastic therapy., the fact that his wounds are decreasing in size    Person(s) Educated Patient    Methods Explanation    Comprehension Verbalized understanding              PT Short Term Goals - 03/28/21 1222       PT SHORT TERM GOAL #1   Title Patient wound sizes to decrease 50% to decrease risk of infection.    Time 2    Period Weeks    Status On-going    Target Date 03/28/21      PT SHORT TERM GOAL #2   Title Patient to experience pain no more than 1/10 in wound area in order to show general improvement of  condition  Time 2    Period Weeks    Status Achieved    Target Date 03/28/21               PT Long Term Goals - 03/28/21 1223       PT LONG TERM GOAL #1   Title Patient to demonstrate full wound healing in order to show resolution of condition     Time 4    Period Weeks    Status On-going      PT LONG TERM GOAL #2   Title Patient and family to be independent in skin care strategies and edema control in order to assist in prevention of other wound formation     Time 4    Period Weeks    Status Achieved                   Plan - 03/28/21 1222     Clinical Impression Statement see above    Personal Factors and Comorbidities Comorbidity 3+    Comorbidities CKD, HTN, HLD, hx MI, CVD, DM, lymphedema    Examination-Activity Limitations Bathing;Locomotion Level;Transfers;Stand    Examination-Participation Restrictions Occupation;Community Activity    Stability/Clinical Decision Making Stable/Uncomplicated    Rehab Potential Good    PT Frequency 2x / week    PT Duration 4 weeks    PT Treatment/Interventions ADLs/Self Care Home Management;Cryotherapy;Electrical Stimulation;Moist Heat;Gait training;Stair training;Functional mobility training;Therapeutic activities;Therapeutic exercise;Balance training;Neuromuscular re-education;Patient/family education;Manual lymph drainage;Manual techniques;Compression bandaging;Scar mobilization    PT Next Visit Plan change dressings and debride PRN to promote wound healing    Consulted and Agree with Plan of Care Patient             Patient will benefit from skilled therapeutic intervention in order to improve the following deficits and impairments:  Abnormal gait, Difficulty walking, Decreased skin integrity  Visit Diagnosis: Wound of right leg, subsequent encounter  Difficulty in walking, not elsewhere classified     Problem List Patient Active Problem List   Diagnosis Date Noted   Multiple myeloma in remission  ([place]) 11/10/2017   CKD (chronic kidney disease) stage 3, GFR 30-59 ml/min (HCC) 68/37/2902   Chronic systolic CHF (congestive heart failure) ([place]) 11/10/2017   MRSA bacteremia 10/03/2017   Pressure injury of skin 08/04/2017   Fournier's gangrene of scrotum 04/14/2017   Hypogammaglobulinemia ([place]) 03/09/2016   Diaphragmatic hernia without obstruction 04/17/5207   Chronic systolic heart failure ([place]) 12/19/2013   Anemia, normocytic normochromic 10/09/2012   Chronic pancreatitis ([place]) 10/07/2012   Atrial flutter ([place]) 09/17/2012   DDD (degenerative disc disease), cervical 03/18/2012   Arteriosclerotic cardiovascular disease (ASCVD)    Hypertension    Hyperlipidemia    Multiple myeloma ([place]) 07/01/2011   Morbid obesity ([place]) 04/29/2010   Insulin dependent diabetes mellitus 11/15/2008   OSA on CPAP 11/15/2008  Rayetta Humphrey, PT CLT [phone]  03/28/2021, 12:24 PM  [place], [place], [address] Phone: [phone]   Fax:  [phone]  Name: [name] MRN: [id number] Date of Birth: [birth year]

## 2021-04-02 ENCOUNTER — Other Ambulatory Visit: Payer: Self-pay

## 2021-04-02 ENCOUNTER — Ambulatory Visit (HOSPITAL_COMMUNITY): Payer: BC Managed Care – PPO | Attending: Family Medicine | Admitting: Physical Therapy

## 2021-04-02 ENCOUNTER — Encounter (HOSPITAL_COMMUNITY): Payer: Self-pay | Admitting: Physical Therapy

## 2021-04-02 DIAGNOSIS — R262 Difficulty in walking, not elsewhere classified: Secondary | ICD-10-CM | POA: Diagnosis not present

## 2021-04-02 DIAGNOSIS — N170 Acute kidney failure with tubular necrosis: Secondary | ICD-10-CM | POA: Diagnosis not present

## 2021-04-02 DIAGNOSIS — E1142 Type 2 diabetes mellitus with diabetic polyneuropathy: Secondary | ICD-10-CM | POA: Diagnosis not present

## 2021-04-02 DIAGNOSIS — S81801D Unspecified open wound, right lower leg, subsequent encounter: Secondary | ICD-10-CM

## 2021-04-02 DIAGNOSIS — E1122 Type 2 diabetes mellitus with diabetic chronic kidney disease: Secondary | ICD-10-CM | POA: Diagnosis not present

## 2021-04-02 DIAGNOSIS — L97522 Non-pressure chronic ulcer of other part of left foot with fat layer exposed: Secondary | ICD-10-CM | POA: Diagnosis not present

## 2021-04-02 DIAGNOSIS — N189 Chronic kidney disease, unspecified: Secondary | ICD-10-CM | POA: Diagnosis not present

## 2021-04-02 DIAGNOSIS — D638 Anemia in other chronic diseases classified elsewhere: Secondary | ICD-10-CM | POA: Diagnosis not present

## 2021-04-02 NOTE — Therapy (Signed)
[place], [place], [address] Phone: [phone]   Fax:  [phone]  Wound Care Therapy  Patient Details  Name: [name] MRN: [id number] Date of Birth: 1961-02-18 Referring Provider (PT): [name] MD   Encounter Date: 04/02/2021   PT End of Session - 04/02/21 1655     Visit Number 6    Number of Visits 8    Date for PT Re-Evaluation 04/11/21    Authorization Type BCBS (no auth, no 30VL)    PT Start Time 1601    PT Stop Time 1650    PT Time Calculation (min) 35 min    Activity Tolerance Patient tolerated treatment well    Behavior During Therapy [facility] for tasks assessed/performed             Past Medical History:  Diagnosis Date   Abscess 04/2017   Scrotal   Anemia    Arteriosclerotic cardiovascular disease (ASCVD)    MI-2000s; stent to the proximal LAD and diagonal in 2001; stress nuclear in 2008-impaired exercise capacity, left ventricular dilatation, moderately to severely depressed EF, apical, inferior and anteroseptal scar   Arthritis    Atrial flutter (HCC)    Bence-Jones proteinuria 05/05/2011   Cellulitis of leg    both legs   Chronic diarrhea    Chronic kidney disease, stage 3, mod decreased GFR (HCC)    Creatinine of 1.84 in 06/2011 and 1.5 in 07/2011   Diabetes mellitus    Insulin   Dysrhythmia    AFlutter   GERD (gastroesophageal reflux disease)    Gout    Hyperlipidemia    Hypertension    Injection site reaction    Multiple myeloma 07/01/2011   Myocardial infarction ([place]) 2000   Obesity    Pedal edema    Venous insufficiency   Sleep apnea    uses cpap   Ulcer     Past Surgical History:  Procedure Laterality Date   ABSCESS DRAINAGE     Scrotal   BIOPSY  01/02/2012   Procedure: BIOPSY;  Surgeon: Rogene Houston, MD;  [facility];  Service: Endoscopy;  Laterality: N/A;   BONE MARROW BIOPSY  05/13/11   CARDIAC CATHETERIZATION     cardiac stent    CARDIOVERSION N/A 10/13/2012   Procedure: CARDIOVERSION;  Surgeon: Yehuda Savannah, MD;  [facility]: [facility];  Service: Cardiovascular;  Laterality: N/A;   CATARACT EXTRACTION W/PHACO Left 02/13/2014   Procedure: CATARACT EXTRACTION PHACO AND INTRAOCULAR LENS PLACEMENT ([place]);  Surgeon: Tonny Branch, MD;  Location: [facility];  Service: Ophthalmology;  Laterality: Left;  CDE:  7.67   CATARACT EXTRACTION W/PHACO Right 03/02/2014   Procedure: CATARACT EXTRACTION PHACO AND INTRAOCULAR LENS PLACEMENT RIGHT EYE CDE=16.81;  Surgeon: Tonny Branch, MD;  [facility];  Service: Ophthalmology;  Laterality: Right;   COLONOSCOPY  11/28/2011   Procedure: COLONOSCOPY;  Surgeon: Rogene Houston, MD;  [facility];  Service: Endoscopy;  Laterality: N/A;  [place] Right 12/16/2017   Procedure: CYSTOSCOPY WITH RIGHT RETROGRADE PYELOGRAM AND RIGHT URETERAL STENT REMOVAL;  Surgeon: Cleon Gustin, MD;  Location: [facility];  Service: Urology;  Laterality: Right;   CYSTOSCOPY W/ URETERAL STENT PLACEMENT Right 07/31/2017   Procedure: CYSTOSCOPY WITH RETROGRADE PYELOGRAM/URETERAL STENT PLACEMENT;  Surgeon: Cleon Gustin, MD;  [facility];  Service: Urology;  Laterality: Right;   ESOPHAGOGASTRODUODENOSCOPY  01/02/2012   Procedure: ESOPHAGOGASTRODUODENOSCOPY (EGD);  Surgeon:  Rogene Houston, MD;  [facility];  Service: Endoscopy;  Laterality: N/A;  100   ESOPHAGOGASTRODUODENOSCOPY N/A 09/20/2012   Procedure: ESOPHAGOGASTRODUODENOSCOPY (EGD);  Surgeon: Rogene Houston, MD;  [facility];  Service: Endoscopy;  Laterality: N/A;   EUS N/A 10/07/2012   Procedure: UPPER ENDOSCOPIC ULTRASOUND (EUS) LINEAR;  Surgeon: Milus Banister, MD;  [facility];  Service: Endoscopy;  Laterality: N/A;   INCISION AND DRAINAGE ABSCESS N/A 04/14/2017   Procedure: INCISION AND DRAINAGE ABSCESS;  Surgeon: Ceasar Mons, MD;  [facility];  Service: Urology;  Laterality:  N/A;   INCISION AND DRAINAGE ABSCESS ANAL     IRRIGATION AND DEBRIDEMENT ABSCESS N/A 04/15/2017   Procedure: DEBRIDEMENT SCROTAL WOUND AND DRESSING CHANGE;  Surgeon: Ceasar Mons, MD;  [facility];  Service: Urology;  Laterality: N/A;   IRRIGATION AND DEBRIDEMENT ABSCESS N/A 04/17/2017   Procedure: IRRIGATION AND DEBRIDEMENT ABSCESS;  Surgeon: Ceasar Mons, MD;  [facility];  Service: Urology;  Laterality: N/A;  RM 3   LAPAROSCOPIC GASTRIC BANDING  2006   has been removed   OSTECTOMY Right 04/08/2017   Procedure: OSTECTOMY RIGHT GREAT TOE;  Surgeon: Caprice Beaver, DPM;  [facility];  Service: Podiatry;  Laterality: Right;   PORT-A-CATH REMOVAL Left 12/07/2012   Procedure: REMOVAL PORT-A-CATH;  Surgeon: Scherry Ran, MD;  Location[facility];  Service: General;  Laterality: Left;   PORT-A-CATH REMOVAL N/A 10/02/2017   Procedure: MINOR REMOVAL PORT-A-CATH AT BEDSIDE;  Surgeon: Donnie Mesa, MD;  Location: [place];  Service: General;  Laterality: N/A;   PORTACATH PLACEMENT  07/07/2011   Procedure: INSERTION PORT-A-CATH;  Surgeon: Scherry Ran, MD;  Location: [facility];  Service: General;  Laterality: N/A;   PORTACATH PLACEMENT N/A 12/07/2012   Procedure: INSERTION PORT-A-CATH;  Surgeon: Scherry Ran, MD;  [facility]: [facility];  Service: General;  Laterality: N/A;  Attempted portacath placement on left and right side   URETEROSCOPY Right 12/16/2017   Procedure: DIAGNOSTIC RIGHT URETEROSCOPY;  Surgeon: Cleon Gustin, MD;  [facility];  Service: Urology;  Laterality: Right;   WOUND DEBRIDEMENT Right 04/08/2017   Procedure: EXCISION ULCERATION RIGHT GREAT TOE;  Surgeon: Caprice Beaver, DPM;  [facility]: [facility];  Service: Podiatry;  Laterality: Right;   WRIST SURGERY     Left; removal of bone fragment    There were no vitals filed for this visit.               Wound Therapy - 04/02/21 0001     Subjective Pt states that his  bandage stayed up better.  He has not been able to get ahold of elastic therapy yet to order new ccompression garments.    Patient and Family Stated Goals wound to heal    Date of Onset 02/12/21    Prior Treatments compression, neosporin and other topicals    Pain Score 0-No pain    Evaluation and Treatment Procedures agreed to    Wound Properties Date First Assessed: 03/14/21 Time First Assessed: 0730 Wound Type: Skin tear Location: Pretibial Location Orientation: Distal;Right;Medial Wound Description (Comments): medial shin wound Present on Admission: Yes Final Assessment Date: 04/02/21 Final Assessment Time: 1630   Wound Properties Date First Assessed: 03/14/21 Time First Assessed: 0745 Wound Type: Skin tear Location: Pretibial Location Orientation: Distal;Right Wound Description (Comments): R shin wound inferior Present on Admission: Yes Final Assessment Date: 04/02/21 Final Assessment Time: 1630   Wound Properties Date First  Assessed: 03/14/21 Time First Assessed: 0745 Wound Type: Skin tear Location: Pretibial Location Orientation: Distal;Right;Lateral Wound Description (Comments): lateral shin wound Present on Admission: Yes   Dressing Type Compression wrap;Gauze (Comment)    Dressing Changed Changed    Dressing Status Old drainage    Dressing Change Frequency PRN    Site / Wound Assessment Granulation tissue    % Wound base Red or Granulating 100%   after cleansing.   Wound Length (cm) 0.4 cm    Wound Width (cm) 0.8 cm    Wound Surface Area (cm^2) 0.32 cm^2    Drainage Amount Scant    Drainage Description Serous    Treatment Cleansed   manual techniques to decrease edema.   Wound Therapy - Clinical Statement PT wound continues to approximate.  Covered in slough with removal of compression bandage but easily removed with cleansing.   Therapist continued to complete manual to decrease edema in Rt LE.  Pt will continue to call elastic therapy to attempt to get new compression.    Wound  Therapy - Functional Problem List walking    Factors Delaying/Impairing Wound Healing Diabetes Mellitus;Multiple medical problems    Hydrotherapy Plan Debridement;Dressing change;Patient/family education;Pulsatile lavage with suction    Wound Therapy - Frequency 2X / week    Wound Therapy - Current Recommendations PT    Wound Plan dressing changes and manual  PRN to promote wound healing    Dressing  vaseline profore compression system using ab pad and extra cotton to acquire conial shap.                       PT Short Term Goals - 04/02/21 1656       PT SHORT TERM GOAL #1   Title Patient wound sizes to decrease 50% to decrease risk of infection.    Time 2    Period Weeks    Status Partially Met    Target Date 03/28/21      PT SHORT TERM GOAL #2   Title Patient to experience pain no more than 1/10 in wound area in order to show general improvement of condition     Time 2    Period Weeks    Status Achieved    Target Date 03/28/21               PT Long Term Goals - 03/28/21 1223       PT LONG TERM GOAL #1   Title Patient to demonstrate full wound healing in order to show resolution of condition     Time 4    Period Weeks    Status On-going      PT LONG TERM GOAL #2   Title Patient and family to be independent in skin care strategies and edema control in order to assist in prevention of other wound formation     Time 4    Period Weeks    Status Achieved                   Plan - 04/02/21 1655     Clinical Impression Statement see above    Personal Factors and Comorbidities Comorbidity 3+    Comorbidities CKD, HTN, HLD, hx MI, CVD, DM, lymphedema    Examination-Activity Limitations Bathing;Locomotion Level;Transfers;Stand    Examination-Participation Restrictions Occupation;Community Activity    Stability/Clinical Decision Making Stable/Uncomplicated    Rehab Potential Good    PT Frequency 2x / week    PT Duration  4 weeks    PT  Treatment/Interventions ADLs/Self Care Home Management;Cryotherapy;Electrical Stimulation;Moist Heat;Gait training;Stair training;Functional mobility training;Therapeutic activities;Therapeutic exercise;Balance training;Neuromuscular re-education;Patient/family education;Manual lymph drainage;Manual techniques;Compression bandaging;Scar mobilization    PT Next Visit Plan change dressings and debride PRN to promote wound healing    Consulted and Agree with Plan of Care Patient             Patient will benefit from skilled therapeutic intervention in order to improve the following deficits and impairments:  Abnormal gait, Difficulty walking, Decreased skin integrity  Visit Diagnosis: Wound of right leg, subsequent encounter  Difficulty in walking, not elsewhere classified     Problem List Patient Active Problem List   Diagnosis Date Noted   Multiple myeloma in remission ([place]) 11/10/2017   CKD (chronic kidney disease) stage 3, GFR 30-59 ml/min (HCC) 97/58/8325   Chronic systolic CHF (congestive heart failure) ([place]) 11/10/2017   MRSA bacteremia 10/03/2017   Pressure injury of skin 08/04/2017   Fournier's gangrene of scrotum 04/14/2017   Hypogammaglobulinemia ([place]) 03/09/2016   Diaphragmatic hernia without obstruction 49/82/6415   Chronic systolic heart failure ([place]) 12/19/2013   Anemia, normocytic normochromic 10/09/2012   Chronic pancreatitis ([place]) 10/07/2012   Atrial flutter ([place]) 09/17/2012   DDD (degenerative disc disease), cervical 03/18/2012   Arteriosclerotic cardiovascular disease (ASCVD)    Hypertension    Hyperlipidemia    Multiple myeloma ([place]) 07/01/2011   Morbid obesity ([place]) 04/29/2010   Insulin dependent diabetes mellitus 11/15/2008   OSA on CPAP 11/15/2008  Rayetta Humphrey, PT CLT [phone]  04/02/2021, 4:57 PM  [place], [place], [address] Phone: [phone]   Fax:   [phone]  Name: [name] MRN: [id number] Date of Birth: 05/13/1961

## 2021-04-04 ENCOUNTER — Ambulatory Visit (HOSPITAL_COMMUNITY): Payer: BC Managed Care – PPO | Admitting: Physical Therapy

## 2021-04-04 ENCOUNTER — Other Ambulatory Visit: Payer: Self-pay

## 2021-04-04 DIAGNOSIS — D638 Anemia in other chronic diseases classified elsewhere: Secondary | ICD-10-CM | POA: Diagnosis not present

## 2021-04-04 DIAGNOSIS — N189 Chronic kidney disease, unspecified: Secondary | ICD-10-CM | POA: Diagnosis not present

## 2021-04-04 DIAGNOSIS — S81801D Unspecified open wound, right lower leg, subsequent encounter: Secondary | ICD-10-CM

## 2021-04-04 DIAGNOSIS — N170 Acute kidney failure with tubular necrosis: Secondary | ICD-10-CM | POA: Diagnosis not present

## 2021-04-04 DIAGNOSIS — R262 Difficulty in walking, not elsewhere classified: Secondary | ICD-10-CM

## 2021-04-04 DIAGNOSIS — R809 Proteinuria, unspecified: Secondary | ICD-10-CM | POA: Diagnosis not present

## 2021-04-04 NOTE — Therapy (Signed)
[facility] [place], [place], [address] Phone: [phone]   Fax:  [phone]  Wound Care Therapy  Patient Details  Name: [name] MRN: [id number] Date of Birth: 04-01-1961 Referring Provider (PT): [name] MD   Encounter Date: 04/04/2021   PT End of Session - 04/04/21 1701     Visit Number 7    Number of Visits 8    Date for PT Re-Evaluation 04/11/21    Authorization Type BCBS (no auth, no 30VL)    Progress Note Due on Visit 8    PT Start Time 1620    PT Stop Time 1650    PT Time Calculation (min) 30 min    Activity Tolerance Patient tolerated treatment well    Behavior During Therapy [facility] for tasks assessed/performed             Past Medical History:  Diagnosis Date   Abscess 04/2017   Scrotal   Anemia    Arteriosclerotic cardiovascular disease (ASCVD)    MI-2000s; stent to the proximal LAD and diagonal in 2001; stress nuclear in 2008-impaired exercise capacity, left ventricular dilatation, moderately to severely depressed EF, apical, inferior and anteroseptal scar   Arthritis    Atrial flutter (HCC)    Bence-Jones proteinuria 05/05/2011   Cellulitis of leg    both legs   Chronic diarrhea    Chronic kidney disease, stage 3, mod decreased GFR (HCC)    Creatinine of 1.84 in 06/2011 and 1.5 in 07/2011   Diabetes mellitus    Insulin   Dysrhythmia    AFlutter   GERD (gastroesophageal reflux disease)    Gout    Hyperlipidemia    Hypertension    Injection site reaction    Multiple myeloma 07/01/2011   Myocardial infarction ([place]) 2000   Obesity    Pedal edema    Venous insufficiency   Sleep apnea    uses cpap   Ulcer     Past Surgical History:  Procedure Laterality Date   ABSCESS DRAINAGE     Scrotal   BIOPSY  01/02/2012   Procedure: BIOPSY;  Surgeon: Rogene Houston, MD;  [facility];  Service: Endoscopy;  Laterality: N/A;   BONE MARROW BIOPSY  05/13/11   CARDIAC  CATHETERIZATION     cardiac stent   CARDIOVERSION N/A 10/13/2012   Procedure: CARDIOVERSION;  Surgeon: Yehuda Savannah, MD;  Location: [facility] ORS;  Service: Cardiovascular;  Laterality: N/A;   CATARACT EXTRACTION W/PHACO Left 02/13/2014   Procedure: CATARACT EXTRACTION PHACO AND INTRAOCULAR LENS PLACEMENT ([place]);  Surgeon: Tonny Branch, MD;  Location: [facility];  Service: Ophthalmology;  Laterality: Left;  CDE:  7.67   CATARACT EXTRACTION W/PHACO Right 03/02/2014   Procedure: CATARACT EXTRACTION PHACO AND INTRAOCULAR LENS PLACEMENT RIGHT EYE CDE=16.81;  Surgeon: Tonny Branch, MD;  Location: [facility];  Service: Ophthalmology;  Laterality: Right;   COLONOSCOPY  11/28/2011   Procedure: COLONOSCOPY;  Surgeon: Rogene Houston, MD;  [facility];  Service: Endoscopy;  Laterality: N/A;  [place] Right 12/16/2017   Procedure: CYSTOSCOPY WITH RIGHT RETROGRADE PYELOGRAM AND RIGHT URETERAL STENT REMOVAL;  Surgeon: Cleon Gustin, MD;  Location: [facility];  Service: Urology;  Laterality: Right;   CYSTOSCOPY W/ URETERAL STENT PLACEMENT Right 07/31/2017   Procedure: CYSTOSCOPY WITH RETROGRADE PYELOGRAM/URETERAL STENT PLACEMENT;  Surgeon: Cleon Gustin, MD;  [facility];  Service: Urology;  Laterality: Right;   ESOPHAGOGASTRODUODENOSCOPY  01/02/2012   Procedure: ESOPHAGOGASTRODUODENOSCOPY (EGD);  Surgeon: Rogene Houston, MD;  [facility];  Service: Endoscopy;  Laterality: N/A;  100   ESOPHAGOGASTRODUODENOSCOPY N/A 09/20/2012   Procedure: ESOPHAGOGASTRODUODENOSCOPY (EGD);  Surgeon: Rogene Houston, MD;  [facility];  Service: Endoscopy;  Laterality: N/A;   EUS N/A 10/07/2012   Procedure: UPPER ENDOSCOPIC ULTRASOUND (EUS) LINEAR;  Surgeon: Milus Banister, MD;  [facility];  Service: Endoscopy;  Laterality: N/A;   INCISION AND DRAINAGE ABSCESS N/A 04/14/2017   Procedure: INCISION AND DRAINAGE ABSCESS;  Surgeon: Ceasar Mons, MD;  [facility]  [facility];  Service: Urology;  Laterality: N/A;   INCISION AND DRAINAGE ABSCESS ANAL     IRRIGATION AND DEBRIDEMENT ABSCESS N/A 04/15/2017   Procedure: DEBRIDEMENT SCROTAL WOUND AND DRESSING CHANGE;  Surgeon: Ceasar Mons, MD;  [facility];  Service: Urology;  Laterality: N/A;   IRRIGATION AND DEBRIDEMENT ABSCESS N/A 04/17/2017   Procedure: IRRIGATION AND DEBRIDEMENT ABSCESS;  Surgeon: Ceasar Mons, MD;  [facility];  Service: Urology;  Laterality: N/A;  RM 3   LAPAROSCOPIC GASTRIC BANDING  2006   has been removed   OSTECTOMY Right 04/08/2017   Procedure: OSTECTOMY RIGHT GREAT TOE;  Surgeon: Caprice Beaver, DPM;  Location[facility];  Service: Podiatry;  Laterality: Right;   PORT-A-CATH REMOVAL Left 12/07/2012   Procedure: REMOVAL PORT-A-CATH;  Surgeon: Scherry Ran, MD;  Location: [facility];  Service: General;  Laterality: Left;   PORT-A-CATH REMOVAL N/A 10/02/2017   Procedure: MINOR REMOVAL PORT-A-CATH AT BEDSIDE;  Surgeon: Donnie Mesa, MD;  Location: [facility] [place];  Service: General;  Laterality: N/A;   PORTACATH PLACEMENT  07/07/2011   Procedure: INSERTION PORT-A-CATH;  Surgeon: Scherry Ran, MD;  [facility];  Service: General;  Laterality: N/A;   PORTACATH PLACEMENT N/A 12/07/2012   Procedure: INSERTION PORT-A-CATH;  Surgeon: Scherry Ran, MD;  [facility]: [facility];  Service: General;  Laterality: N/A;  Attempted portacath placement on left and right side   URETEROSCOPY Right 12/16/2017   Procedure: DIAGNOSTIC RIGHT URETEROSCOPY;  Surgeon: Cleon Gustin, MD;  [facility]: [facility];  Service: Urology;  Laterality: Right;   WOUND DEBRIDEMENT Right 04/08/2017   Procedure: EXCISION ULCERATION RIGHT GREAT TOE;  Surgeon: Caprice Beaver, DPM;  Location: [facility];  Service: Podiatry;  Laterality: Right;   WRIST SURGERY     Left; removal of bone fragment    There were no vitals filed for this visit.               Wound Therapy - 04/04/21 0001      Subjective Pt has no complaints he would like to space his treatments out more evenly    Patient and Family Stated Goals wound to heal    Date of Onset 02/12/21    Prior Treatments compression, neosporin and other topicals    Pain Scale 0-10    Pain Score 0-No pain    Evaluation and Treatment Procedures Explained to Patient/Family Yes    Wound Properties Date First Assessed: 03/14/21 Time First Assessed: 0745 Wound Type: Skin tear Location: Pretibial Location Orientation: Distal;Right;Lateral Wound Description (Comments): lateral shin wound Present on Admission: Yes   Dressing Type Compression wrap    Dressing Changed Changed    Dressing Status Old drainage    Dressing Change Frequency PRN    Site / Wound Assessment Granulation tissue    % Wound base Red or Granulating 100%    Wound Length (  cm) 0.4 cm    Wound Width (cm) 0.7 cm    Wound Surface Area (cm^2) 0.28 cm^2    Drainage Amount Scant    Drainage Description Serous    Treatment Cleansed    Wound Therapy - Clinical Statement Pt wound is 100% granulated prior to cleansing.  Induration in LE LE has decreased; compression bandaging stayed up.    Wound Therapy - Functional Problem List walking    Factors Delaying/Impairing Wound Healing Diabetes Mellitus;Multiple medical problems    Hydrotherapy Plan Debridement;Dressing change;Patient/family education;Pulsatile lavage with suction    Wound Therapy - Frequency 2X / week    Wound Therapy - Current Recommendations PT    Wound Plan dressing changes and manual  PRN to promote wound healing    Dressing  vaseline profore compression system using ab pad and extra cotton to acquire conial shap.                       PT Short Term Goals - 04/02/21 1656       PT SHORT TERM GOAL #1   Title Patient wound sizes to decrease 50% to decrease risk of infection.    Time 2    Period Weeks    Status Partially Met    Target Date 03/28/21      PT SHORT TERM GOAL #2   Title  Patient to experience pain no more than 1/10 in wound area in order to show general improvement of condition     Time 2    Period Weeks    Status Achieved    Target Date 03/28/21               PT Long Term Goals - 03/28/21 1223       PT LONG TERM GOAL #1   Title Patient to demonstrate full wound healing in order to show resolution of condition     Time 4    Period Weeks    Status On-going      PT LONG TERM GOAL #2   Title Patient and family to be independent in skin care strategies and edema control in order to assist in prevention of other wound formation     Time 4    Period Weeks    Status Achieved                   Plan - 04/04/21 1702     Clinical Impression Statement as above    Personal Factors and Comorbidities Comorbidity 3+    Comorbidities CKD, HTN, HLD, hx MI, CVD, DM, lymphedema    Examination-Activity Limitations Bathing;Locomotion Level;Transfers;Stand    Examination-Participation Restrictions Occupation;Community Activity    Stability/Clinical Decision Making Stable/Uncomplicated    Rehab Potential Good    PT Frequency 2x / week    PT Duration 4 weeks    PT Treatment/Interventions ADLs/Self Care Home Management;Cryotherapy;Electrical Stimulation;Moist Heat;Gait training;Stair training;Functional mobility training;Therapeutic activities;Therapeutic exercise;Balance training;Neuromuscular re-education;Patient/family education;Manual lymph drainage;Manual techniques;Compression bandaging;Scar mobilization    PT Next Visit Plan change dressings and debride PRN to promote wound healing    Consulted and Agree with Plan of Care Patient             Patient will benefit from skilled therapeutic intervention in order to improve the following deficits and impairments:  Abnormal gait, Difficulty walking, Decreased skin integrity  Visit Diagnosis: Difficulty in walking, not elsewhere classified  Wound of right leg, subsequent  encounter     Problem  List Patient Active Problem List   Diagnosis Date Noted   Multiple myeloma in remission ([place]) 11/10/2017   CKD (chronic kidney disease) stage 3, GFR 30-59 ml/min (HCC) 14/15/9733   Chronic systolic CHF (congestive heart failure) ([place]) 11/10/2017   MRSA bacteremia 10/03/2017   Pressure injury of skin 08/04/2017   Fournier's gangrene of scrotum 04/14/2017   Hypogammaglobulinemia ([place]) 03/09/2016   Diaphragmatic hernia without obstruction 12/50/8719   Chronic systolic heart failure ([place]) 12/19/2013   Anemia, normocytic normochromic 10/09/2012   Chronic pancreatitis ([place]) 10/07/2012   Atrial flutter ([place]) 09/17/2012   DDD (degenerative disc disease), cervical 03/18/2012   Arteriosclerotic cardiovascular disease (ASCVD)    Hypertension    Hyperlipidemia    Multiple myeloma ([place]) 07/01/2011   Morbid obesity ([place]) 04/29/2010   Insulin dependent diabetes mellitus 11/15/2008   OSA on CPAP 11/15/2008   Rayetta Humphrey, PT CLT [phone]  04/04/2021, 5:03 PM  [facility] [address] [place], [place], [address] Phone: [phone]   Fax:  [phone]  Name: [name] MRN: [id number] Date of Birth: 11-08-1960

## 2021-04-08 ENCOUNTER — Encounter (HOSPITAL_COMMUNITY): Payer: Self-pay | Admitting: Physical Therapy

## 2021-04-08 ENCOUNTER — Other Ambulatory Visit: Payer: Self-pay

## 2021-04-08 ENCOUNTER — Ambulatory Visit (HOSPITAL_COMMUNITY): Payer: BC Managed Care – PPO | Admitting: Physical Therapy

## 2021-04-08 DIAGNOSIS — R262 Difficulty in walking, not elsewhere classified: Secondary | ICD-10-CM

## 2021-04-08 DIAGNOSIS — S81801D Unspecified open wound, right lower leg, subsequent encounter: Secondary | ICD-10-CM

## 2021-04-09 ENCOUNTER — Ambulatory Visit (HOSPITAL_COMMUNITY): Payer: BC Managed Care – PPO | Admitting: Physical Therapy

## 2021-04-09 ENCOUNTER — Encounter (HOSPITAL_COMMUNITY)
Admission: RE | Admit: 2021-04-09 | Discharge: 2021-04-09 | Disposition: A | Discharge status: Home / Self Care | Payer: BC Managed Care – PPO | Source: Ambulatory Visit | Attending: Nephrology | Admitting: Nephrology

## 2021-04-09 DIAGNOSIS — E1122 Type 2 diabetes mellitus with diabetic chronic kidney disease: Secondary | ICD-10-CM | POA: Diagnosis not present

## 2021-04-09 DIAGNOSIS — S81801D Unspecified open wound, right lower leg, subsequent encounter: Secondary | ICD-10-CM | POA: Diagnosis not present

## 2021-04-09 DIAGNOSIS — N184 Chronic kidney disease, stage 4 (severe): Secondary | ICD-10-CM | POA: Insufficient documentation

## 2021-04-09 DIAGNOSIS — I872 Venous insufficiency (chronic) (peripheral): Secondary | ICD-10-CM | POA: Diagnosis not present

## 2021-04-09 DIAGNOSIS — D631 Anemia in chronic kidney disease: Secondary | ICD-10-CM | POA: Diagnosis not present

## 2021-04-09 DIAGNOSIS — N183 Chronic kidney disease, stage 3 unspecified: Secondary | ICD-10-CM | POA: Diagnosis not present

## 2021-04-09 LAB — POCT HEMOGLOBIN-HEMACUE: Hemoglobin: 9.9 g/dL — ABNORMAL LOW (ref 13.0–17.0)

## 2021-04-09 MED ORDER — EPOETIN ALFA-EPBX 10000 UNIT/ML IJ SOLN
10000.0000 [IU] | Freq: Once | INTRAMUSCULAR | Status: AC
  Administered 2021-04-09: 10000 [IU] via SUBCUTANEOUS

## 2021-04-09 MED ORDER — EPOETIN ALFA-EPBX 10000 UNIT/ML IJ SOLN
INTRAMUSCULAR | Status: AC
  Filled 2021-04-09: qty 1

## 2021-04-09 NOTE — Therapy (Addendum)
[name], [place], [address] Phone: [phone]   Fax:  [phone]  Wound Care Therapy and Progress Note  Patient Details  Name: [name] MRN: [id number] Date of Birth: [birth year] Referring Provider (PT): [name] MD  Progress Note Reporting Period 03/28/21 to 04/08/21  See note below for Objective Data and Assessment of Progress/Goals.      Encounter Date: 04/08/2021   PT End of Session - 04/08/21 1700     Visit Number 8    Number of Visits 16    Date for PT Re-Evaluation 05/06/21    Authorization Type BCBS (no auth, no 30VL)    Progress Note Due on Visit 8    PT Start Time 1616    PT Stop Time 1655    PT Time Calculation (min) 39 min    Activity Tolerance Patient tolerated treatment well    Behavior During Therapy WFL for tasks assessed/performed             Past Medical History:  Diagnosis Date   Abscess 04/2017   Scrotal   Anemia    Arteriosclerotic cardiovascular disease (ASCVD)    MI-2000s; stent to the proximal LAD and diagonal in 2001; stress nuclear in 2008-impaired exercise capacity, left ventricular dilatation, moderately to severely depressed EF, apical, inferior and anteroseptal scar   Arthritis    Atrial flutter (HCC)    Bence-Jones proteinuria 05/05/2011   Cellulitis of leg    both legs   Chronic diarrhea    Chronic kidney disease, stage 3, mod decreased GFR (HCC)    Creatinine of 1.84 in 06/2011 and 1.5 in 07/2011   Diabetes mellitus    Insulin   Dysrhythmia    AFlutter   GERD (gastroesophageal reflux disease)    Gout    Hyperlipidemia    Hypertension    Injection site reaction    Multiple myeloma 07/01/2011   Myocardial infarction ([place]) 2000   Obesity    Pedal edema    Venous insufficiency   Sleep apnea    uses cpap   Ulcer     Past Surgical History:  Procedure Laterality Date   ABSCESS DRAINAGE     Scrotal   BIOPSY  01/02/2012   Procedure: BIOPSY;   Surgeon: Rogene Houston, MD;  [facility];  Service: Endoscopy;  Laterality: N/A;   BONE MARROW BIOPSY  05/13/11   CARDIAC CATHETERIZATION     cardiac stent   CARDIOVERSION N/A 10/13/2012   Procedure: CARDIOVERSION;  Surgeon: Yehuda Savannah, MD;  Location: [facility];  Service: Cardiovascular;  Laterality: N/A;   CATARACT EXTRACTION W/PHACO Left 02/13/2014   Procedure: CATARACT EXTRACTION PHACO AND INTRAOCULAR LENS PLACEMENT ([place]);  Surgeon: Tonny Branch, MD;  Location: [facility];  Service: Ophthalmology;  Laterality: Left;  CDE:  7.67   CATARACT EXTRACTION W/PHACO Right 03/02/2014   Procedure: CATARACT EXTRACTION PHACO AND INTRAOCULAR LENS PLACEMENT RIGHT EYE CDE=16.81;  Surgeon: Tonny Branch, MD;  Location: [facility];  Service: Ophthalmology;  Laterality: Right;   COLONOSCOPY  11/28/2011   Procedure: COLONOSCOPY;  Surgeon: Rogene Houston, MD;  [facility];  Service: Endoscopy;  Laterality: N/A;  [place] Right 12/16/2017   Procedure: CYSTOSCOPY WITH RIGHT RETROGRADE PYELOGRAM AND RIGHT URETERAL STENT REMOVAL;  Surgeon: Cleon Gustin, MD;  Location: [facility];  Service: Urology;  Laterality: Right;   CYSTOSCOPY W/ URETERAL STENT PLACEMENT Right 07/31/2017   Procedure:  CYSTOSCOPY WITH RETROGRADE PYELOGRAM/URETERAL STENT PLACEMENT;  Surgeon: Cleon Gustin, MD;  [facility];  Service: Urology;  Laterality: Right;   ESOPHAGOGASTRODUODENOSCOPY  01/02/2012   Procedure: ESOPHAGOGASTRODUODENOSCOPY (EGD);  Surgeon: Rogene Houston, MD;  [facility];  Service: Endoscopy;  Laterality: N/A;  100   ESOPHAGOGASTRODUODENOSCOPY N/A 09/20/2012   Procedure: ESOPHAGOGASTRODUODENOSCOPY (EGD);  Surgeon: Rogene Houston, MD;  [facility];  Service: Endoscopy;  Laterality: N/A;   EUS N/A 10/07/2012   Procedure: UPPER ENDOSCOPIC ULTRASOUND (EUS) LINEAR;  Surgeon: Milus Banister, MD;  [facility];  Service: Endoscopy;  Laterality: N/A;    INCISION AND DRAINAGE ABSCESS N/A 04/14/2017   Procedure: INCISION AND DRAINAGE ABSCESS;  Surgeon: Ceasar Mons, MD;  [facility];  Service: Urology;  Laterality: N/A;   INCISION AND DRAINAGE ABSCESS ANAL     IRRIGATION AND DEBRIDEMENT ABSCESS N/A 04/15/2017   Procedure: DEBRIDEMENT SCROTAL WOUND AND DRESSING CHANGE;  Surgeon: Ceasar Mons, MD;  [facility];  Service: Urology;  Laterality: N/A;   IRRIGATION AND DEBRIDEMENT ABSCESS N/A 04/17/2017   Procedure: IRRIGATION AND DEBRIDEMENT ABSCESS;  Surgeon: Ceasar Mons, MD;  [facility];  Service: Urology;  Laterality: N/A;  RM 3   LAPAROSCOPIC GASTRIC BANDING  2006   has been removed   OSTECTOMY Right 04/08/2017   Procedure: OSTECTOMY RIGHT GREAT TOE;  Surgeon: Caprice Beaver, DPM;  [facility];  Service: Podiatry;  Laterality: Right;   PORT-A-CATH REMOVAL Left 12/07/2012   Procedure: REMOVAL PORT-A-CATH;  Surgeon: Scherry Ran, MD;  Location: [facility];  Service: General;  Laterality: Left;   PORT-A-CATH REMOVAL N/A 10/02/2017   Procedure: MINOR REMOVAL PORT-A-CATH AT BEDSIDE;  Surgeon: Donnie Mesa, MD;  Location: [facility];  Service: General;  Laterality: N/A;   PORTACATH PLACEMENT  07/07/2011   Procedure: INSERTION PORT-A-CATH;  Surgeon: Scherry Ran, MD;  [facility]: [facility];  Service: General;  Laterality: N/A;   PORTACATH PLACEMENT N/A 12/07/2012   Procedure: INSERTION PORT-A-CATH;  Surgeon: Scherry Ran, MD;  Location: [facility];  Service: General;  Laterality: N/A;  Attempted portacath placement on left and right side   URETEROSCOPY Right 12/16/2017   Procedure: DIAGNOSTIC RIGHT URETEROSCOPY;  Surgeon: Cleon Gustin, MD;  Location[facility];  Service: Urology;  Laterality: Right;   WOUND DEBRIDEMENT Right 04/08/2017   Procedure: EXCISION ULCERATION RIGHT GREAT TOE;  Surgeon: Caprice Beaver, DPM;  Location: [facility];  Service: Podiatry;  Laterality: Right;   WRIST SURGERY      Left; removal of bone fragment    There were no vitals filed for this visit.      [facility] PT Assessment - 04/09/21 0001       Assessment   Medical Diagnosis Open wound of R lower Leg    [name]                     Wound Therapy - 04/09/21 0001     Subjective Patient reports that he feels alright, dressing inplace. STates he brought his compression sock as instructed. Reprots his kidney doctors have been working on his medications because of his fluid retention    Patient and Family Stated Goals wound to heal    Date of Onset 02/12/21    Prior Treatments compression, neosporin and other topicals    Pain Scale 0-10    Pain Score 0-No pain    Evaluation and Treatment Procedures  Explained to Patient/Family Yes    Wound Properties Date First Assessed: 03/14/21 Time First Assessed: 0745 Wound Type: Skin tear Location: Pretibial Location Orientation: Distal;Right;Lateral Wound Description (Comments): lateral shin wound Present on Admission: Yes   Wound Image Images linked: 1    Dressing Type Compression wrap    Dressing Changed Changed    Dressing Status Old drainage    Dressing Change Frequency PRN    Site / Wound Assessment Granulation tissue    % Wound base Red or Granulating 100%    Peri-wound Assessment Edema    Wound Length (cm) 1 cm   was .4   Wound Width (cm) 1 cm   was .7   Wound Depth (cm) 0.2 cm    Wound Volume (cm^3) 0.2 cm^3    Wound Surface Area (cm^2) 1 cm^2    Undermining (cm) from 12-3 o'clock    Margins Unattached edges (unapproximated)    Drainage Amount Scant    Drainage Description Serosanguineous    Treatment Cleansed;Debridement (Selective)    Selective Debridement - Location wound edges, wound bed    Selective Debridement - Tools Used Forceps    Selective Debridement - Tissue Removed devitalized tissue    Wound Therapy - Clinical Statement Wound with slough prior to debridemnet btut able to debride to 100%  granulation. Increased in overall size since last session. Continued with wrapping secondary to increase in size and with patietn reporting he would take compresison off at night which would likely pull down the wound dressing. Added xerform to wound bed. Will continue with current POC.    Wound Therapy - Functional Problem List walking    Factors Delaying/Impairing Wound Healing Diabetes Mellitus;Multiple medical problems    Hydrotherapy Plan Debridement;Dressing change;Patient/family education;Pulsatile lavage with suction    Wound Therapy - Frequency 2X / week    Wound Therapy - Current Recommendations PT    Wound Plan dressing changes and manual  PRN to promote wound healing    Dressing  vaseline, xeroform, profore compression, cotton to acquire conial shap.                       PT Short Term Goals - 04/08/21 1701       PT SHORT TERM GOAL #1   Title Patient wound sizes to decrease 50% to decrease risk of infection.    Time 2    Period Weeks    Status Partially Met    Target Date 03/28/21      PT SHORT TERM GOAL #2   Title Patient to experience pain no more than 1/10 in wound area in order to show general improvement of condition     Time 2    Period Weeks    Status Achieved    Target Date 03/28/21               PT Long Term Goals - 03/28/21 1223       PT LONG TERM GOAL #1   Title Patient to demonstrate full wound healing in order to show resolution of condition     Time 4    Period Weeks    Status On-going      PT LONG TERM GOAL #2   Title Patient and family to be independent in skin care strategies and edema control in order to assist in prevention of other wound formation     Time 4    Period Weeks    Status Achieved  Patient will benefit from skilled therapeutic intervention in order to improve the following deficits and impairments:     Visit Diagnosis: Difficulty in walking, not elsewhere classified  Wound of  right leg, subsequent encounter     Problem List Patient Active Problem List   Diagnosis Date Noted   Multiple myeloma in remission ([place]) 11/10/2017   CKD (chronic kidney disease) stage 3, GFR 30-59 ml/min (HCC) 29/47/6546   Chronic systolic CHF (congestive heart failure) ([place]) 11/10/2017   MRSA bacteremia 10/03/2017   Pressure injury of skin 08/04/2017   Fournier's gangrene of scrotum 04/14/2017   Hypogammaglobulinemia ([place]) 03/09/2016   Diaphragmatic hernia without obstruction 50/35/4656   Chronic systolic heart failure ([place]) 12/19/2013   Anemia, normocytic normochromic 10/09/2012   Chronic pancreatitis ([place]) 10/07/2012   Atrial flutter ([place]) 09/17/2012   DDD (degenerative disc disease), cervical 03/18/2012   Arteriosclerotic cardiovascular disease (ASCVD)    Hypertension    Hyperlipidemia    Multiple myeloma ([place]) 07/01/2011   Morbid obesity ([place]) 04/29/2010   Insulin dependent diabetes mellitus 11/15/2008   OSA on CPAP 11/15/2008    7:25 AM, 04/09/21 Jerene Pitch, DPT Physical Therapy with [facility]  [phone] office   [place], [place], [address] Phone: [phone]   Fax:  [phone]  Name: LEXANDER TREMBLAY MRN: [id number] Date of Birth: Feb 06, 1961

## 2021-04-11 ENCOUNTER — Other Ambulatory Visit: Payer: Self-pay

## 2021-04-11 ENCOUNTER — Encounter (HOSPITAL_COMMUNITY): Payer: Self-pay | Admitting: Physical Therapy

## 2021-04-11 ENCOUNTER — Ambulatory Visit (HOSPITAL_COMMUNITY): Payer: BC Managed Care – PPO | Admitting: Physical Therapy

## 2021-04-11 DIAGNOSIS — S81801D Unspecified open wound, right lower leg, subsequent encounter: Secondary | ICD-10-CM

## 2021-04-11 DIAGNOSIS — R262 Difficulty in walking, not elsewhere classified: Secondary | ICD-10-CM | POA: Diagnosis not present

## 2021-04-11 NOTE — Therapy (Signed)
[facility] [address] [place], , [address] Phone: [phone]   Fax:  [phone]  Wound Care Therapy  Patient Details  Name: [name] MRN: [id number] Date of Birth: 08/19/1960 Referring Provider (PT): Gerald Hill MD   Encounter Date: 04/11/2021   PT End of Session - 04/11/21 1704     Visit Number 9    Number of Visits 16    Date for PT Re-Evaluation 05/06/21    Authorization Type BCBS (no auth, no 30VL)    Progress Note Due on Visit 18    PT Start Time 1629    PT Stop Time 1655    PT Time Calculation (min) 26 min    Activity Tolerance Patient tolerated treatment well    Behavior During Therapy WFL for tasks assessed/performed             Past Medical History:  Diagnosis Date   Abscess 04/2017   Scrotal   Anemia    Arteriosclerotic cardiovascular disease (ASCVD)    MI-2000s; stent to the proximal LAD and diagonal in 2001; stress nuclear in 2008-impaired exercise capacity, left ventricular dilatation, moderately to severely depressed EF, apical, inferior and anteroseptal scar   Arthritis    Atrial flutter (HCC)    Bence-Jones proteinuria 05/05/2011   Cellulitis of leg    both legs   Chronic diarrhea    Chronic kidney disease, stage 3, mod decreased GFR (HCC)    Creatinine of 1.84 in 06/2011 and 1.5 in 07/2011   Diabetes mellitus    Insulin   Dysrhythmia    AFlutter   GERD (gastroesophageal reflux disease)    Gout    Hyperlipidemia    Hypertension    Injection site reaction    Multiple myeloma 07/01/2011   Myocardial infarction (HCC) 2000   Obesity    Pedal edema    Venous insufficiency   Sleep apnea    uses cpap   Ulcer     Past Surgical History:  Procedure Laterality Date   ABSCESS DRAINAGE     Scrotal   BIOPSY  01/02/2012   Procedure: BIOPSY;  Surgeon: Najeeb U Rehman, MD;  [facility];  Service: Endoscopy;  Laterality: N/A;   BONE MARROW BIOPSY  05/13/11   CARDIAC  CATHETERIZATION     cardiac stent   CARDIOVERSION N/A 10/13/2012   Procedure: CARDIOVERSION;  Surgeon: Robert M Rothbart, MD;  [facility]: [facility];  Service: Cardiovascular;  Laterality: N/A;   CATARACT EXTRACTION W/PHACO Left 02/13/2014   Procedure: CATARACT EXTRACTION PHACO AND INTRAOCULAR LENS PLACEMENT (IOC);  Surgeon: Kerry Hunt, MD;  [facility]: [facility];  Service: Ophthalmology;  Laterality: Left;  CDE:  7.67   CATARACT EXTRACTION W/PHACO Right 03/02/2014   Procedure: CATARACT EXTRACTION PHACO AND INTRAOCULAR LENS PLACEMENT RIGHT EYE CDE=16.81;  Surgeon: Kerry Hunt, MD;  [facility];  Service: Ophthalmology;  Laterality: Right;   COLONOSCOPY  11/28/2011   Procedure: COLONOSCOPY;  Surgeon: Najeeb U Rehman, MD;  [facility];  Service: Endoscopy;  Laterality: N/A;  930   CYSTOSCOPY W/ RETROGRADES Right 12/16/2017   Procedure: CYSTOSCOPY WITH RIGHT RETROGRADE PYELOGRAM AND RIGHT URETERAL STENT REMOVAL;  Surgeon: McKenzie, Patrick L, MD;  [facility];  Service: Urology;  Laterality: Right;   CYSTOSCOPY W/ URETERAL STENT PLACEMENT Right 07/31/2017   Procedure: CYSTOSCOPY WITH RETROGRADE PYELOGRAM/URETERAL STENT PLACEMENT;  Surgeon: McKenzie, Patrick L, MD;  [facility];  Service: Urology;  Laterality: Right;   ESOPHAGOGASTRODUODENOSCOPY    01/02/2012   Procedure: ESOPHAGOGASTRODUODENOSCOPY (EGD);  Surgeon: Rogene Houston, MD;  [facility];  Service: Endoscopy;  Laterality: N/A;  100   ESOPHAGOGASTRODUODENOSCOPY N/A 09/20/2012   Procedure: ESOPHAGOGASTRODUODENOSCOPY (EGD);  Surgeon: Rogene Houston, MD;  [facility];  Service: Endoscopy;  Laterality: N/A;   EUS N/A 10/07/2012   Procedure: UPPER ENDOSCOPIC ULTRASOUND (EUS) LINEAR;  Surgeon: Milus Banister, MD;  [facility];  Service: Endoscopy;  Laterality: N/A;   INCISION AND DRAINAGE ABSCESS N/A 04/14/2017   Procedure: INCISION AND DRAINAGE ABSCESS;  Surgeon: Ceasar Mons, MD;  [facility];  Service: Urology;  Laterality: N/A;   INCISION AND DRAINAGE ABSCESS ANAL     IRRIGATION AND DEBRIDEMENT ABSCESS N/A 04/15/2017   Procedure: DEBRIDEMENT SCROTAL WOUND AND DRESSING CHANGE;  Surgeon: Ceasar Mons, MD;  [facility];  Service: Urology;  Laterality: N/A;   IRRIGATION AND DEBRIDEMENT ABSCESS N/A 04/17/2017   Procedure: IRRIGATION AND DEBRIDEMENT ABSCESS;  Surgeon: Ceasar Mons, MD;  [facility];  Service: Urology;  Laterality: N/A;  RM 3   LAPAROSCOPIC GASTRIC BANDING  2006   has been removed   OSTECTOMY Right 04/08/2017   Procedure: OSTECTOMY RIGHT GREAT TOE;  Surgeon: Caprice Beaver, DPM;  [facility];  Service: Podiatry;  Laterality: Right;   PORT-A-CATH REMOVAL Left 12/07/2012   Procedure: REMOVAL PORT-A-CATH;  Surgeon: Scherry Ran, MD;  [facility];  Service: General;  Laterality: Left;   PORT-A-CATH REMOVAL N/A 10/02/2017   Procedure: MINOR REMOVAL PORT-A-CATH AT BEDSIDE;  Surgeon: Donnie Mesa, MD;  Location: [place];  Service: General;  Laterality: N/A;   PORTACATH PLACEMENT  07/07/2011   Procedure: INSERTION PORT-A-CATH;  Surgeon: Scherry Ran, MD;  [facility];  Service: General;  Laterality: N/A;   PORTACATH PLACEMENT N/A 12/07/2012   Procedure: INSERTION PORT-A-CATH;  Surgeon: Scherry Ran, MD;  [facility];  Service: General;  Laterality: N/A;  Attempted portacath placement on left and right side   URETEROSCOPY Right 12/16/2017   Procedure: DIAGNOSTIC RIGHT URETEROSCOPY;  Surgeon: Cleon Gustin, MD;  [facility];  Service: Urology;  Laterality: Right;   WOUND DEBRIDEMENT Right 04/08/2017   Procedure: EXCISION ULCERATION RIGHT GREAT TOE;  Surgeon: Caprice Beaver, DPM;  [facility];  Service: Podiatry;  Laterality: Right;   WRIST SURGERY     Left; removal of bone fragment    There were no vitals filed for this visit.               Wound Therapy - 04/11/21 0001      Subjective Pt states that he has been short of breath of late.  His leg is feeling better.    Patient and Family Stated Goals wound to heal    Date of Onset 02/12/21    Prior Treatments compression, neosporin and other topicals    Pain Scale 0-10    Pain Score 0-No pain    Evaluation and Treatment Procedures Explained to Patient/Family Yes    Evaluation and Treatment Procedures agreed to    Wound Properties Date First Assessed: 03/14/21 Time First Assessed: 0745 Wound Type: Skin tear Location: Pretibial Location Orientation: Distal;Right;Lateral Wound Description (Comments): lateral shin wound Present on Admission: Yes   Dressing Type Bismuth petroleum;Compression wrap    Dressing Changed Changed    Dressing Change Frequency PRN    Site / Wound Assessment Granulation tissue;Friable    % Wound base Red or Granulating  100%    Wound Length (cm) 1 cm    Wound Width (cm) 0.7 cm    Wound Surface Area (cm^2) 0.7 cm^2    Drainage Amount Minimal    Drainage Description Sanguineous    Treatment Cleansed    Wound Therapy - Clinical Statement Wound had slough on it which was able to be removed by cleansing.  Due to wound increasing in size therapist recommends culturing wound.  Request will be sent to MD and hopefully will be back by next visit.   Changed to medihoney in attempt to improve approximation of wound.    Wound Therapy - Functional Problem List walking    Factors Delaying/Impairing Wound Healing Diabetes Mellitus;Multiple medical problems    Hydrotherapy Plan Debridement;Dressing change;Patient/family education;Pulsatile lavage with suction    Wound Therapy - Frequency 2X / week    Wound Therapy - Current Recommendations PT    Wound Plan dressing change    Dressing  vaseline, medihoney 4x4 and profore dressing .                       PT Short Term Goals - 04/08/21 1701       PT SHORT TERM GOAL #1   Title Patient wound sizes to decrease 50% to decrease risk of  infection.    Time 2    Period Weeks    Status Partially Met    Target Date 03/28/21      PT SHORT TERM GOAL #2   Title Patient to experience pain no more than 1/10 in wound area in order to show general improvement of condition     Time 2    Period Weeks    Status Achieved    Target Date 03/28/21               PT Long Term Goals - 03/28/21 1223       PT LONG TERM GOAL #1   Title Patient to demonstrate full wound healing in order to show resolution of condition     Time 4    Period Weeks    Status On-going      PT LONG TERM GOAL #2   Title Patient and family to be independent in skin care strategies and edema control in order to assist in prevention of other wound formation     Time 4    Period Weeks    Status Achieved                   Plan - 04/11/21 1705     Clinical Impression Statement culture wound if order is back continue with medihoney    Personal Factors and Comorbidities Comorbidity 3+    Comorbidities CKD, HTN, HLD, hx MI, CVD, DM, lymphedema    Examination-Activity Limitations Bathing;Locomotion Level;Transfers;Stand    Examination-Participation Restrictions Occupation;Community Activity    Stability/Clinical Decision Making Stable/Uncomplicated    Rehab Potential Good    PT Frequency 2x / week    PT Duration 4 weeks    PT Treatment/Interventions ADLs/Self Care Home Management;Cryotherapy;Electrical Stimulation;Moist Heat;Gait training;Stair training;Functional mobility training;Therapeutic activities;Therapeutic exercise;Balance training;Neuromuscular re-education;Patient/family education;Manual lymph drainage;Manual techniques;Compression bandaging;Scar mobilization    PT Next Visit Plan change dressings and debride PRN to promote wound healing    Consulted and Agree with Plan of Care Patient             Patient will benefit from skilled therapeutic intervention in order to improve the following deficits and impairments:    Abnormal  gait, Difficulty walking, Decreased skin integrity  Visit Diagnosis: Difficulty in walking, not elsewhere classified  Wound of right leg, subsequent encounter     Problem List Patient Active Problem List   Diagnosis Date Noted   Multiple myeloma in remission (HCC) 11/10/2017   CKD (chronic kidney disease) stage 3, GFR 30-59 ml/min (HCC) 11/10/2017   Chronic systolic CHF (congestive heart failure) (HCC) 11/10/2017   MRSA bacteremia 10/03/2017   Pressure injury of skin 08/04/2017   Fournier's gangrene of scrotum 04/14/2017   Hypogammaglobulinemia (HCC) 03/09/2016   Diaphragmatic hernia without obstruction 02/26/2016   Chronic systolic heart failure (HCC) 12/19/2013   Anemia, normocytic normochromic 10/09/2012   Chronic pancreatitis (HCC) 10/07/2012   Atrial flutter (HCC) 09/17/2012   DDD (degenerative disc disease), cervical 03/18/2012   Arteriosclerotic cardiovascular disease (ASCVD)    Hypertension    Hyperlipidemia    Multiple myeloma (HCC) 07/01/2011   Morbid obesity (HCC) 04/29/2010   Insulin dependent diabetes mellitus 11/15/2008   OSA on CPAP 11/15/2008    , PT CLT [phone]  04/11/2021, 5:06 PM  [facility] [address] [place], [place], [address] Phone: [phone]   Fax:  [phone]  Name: [name] MRN: [id number] Date of Birth: 02/11/1961     

## 2021-04-16 ENCOUNTER — Ambulatory Visit (HOSPITAL_COMMUNITY): Payer: BC Managed Care – PPO

## 2021-04-16 ENCOUNTER — Other Ambulatory Visit: Payer: Self-pay

## 2021-04-16 ENCOUNTER — Encounter (HOSPITAL_COMMUNITY): Payer: Self-pay

## 2021-04-16 ENCOUNTER — Other Ambulatory Visit (HOSPITAL_COMMUNITY)
Admission: RE | Admit: 2021-04-16 | Discharge: 2021-04-16 | Disposition: A | Discharge status: Home / Self Care | Payer: BC Managed Care – PPO | Source: Ambulatory Visit | Attending: Family Medicine | Admitting: Family Medicine

## 2021-04-16 DIAGNOSIS — E669 Obesity, unspecified: Secondary | ICD-10-CM | POA: Diagnosis not present

## 2021-04-16 DIAGNOSIS — X58XXXD Exposure to other specified factors, subsequent encounter: Secondary | ICD-10-CM | POA: Diagnosis not present

## 2021-04-16 DIAGNOSIS — N183 Chronic kidney disease, stage 3 unspecified: Secondary | ICD-10-CM | POA: Insufficient documentation

## 2021-04-16 DIAGNOSIS — C9001 Multiple myeloma in remission: Secondary | ICD-10-CM | POA: Diagnosis not present

## 2021-04-16 DIAGNOSIS — Z7901 Long term (current) use of anticoagulants: Secondary | ICD-10-CM | POA: Insufficient documentation

## 2021-04-16 DIAGNOSIS — I872 Venous insufficiency (chronic) (peripheral): Secondary | ICD-10-CM | POA: Insufficient documentation

## 2021-04-16 DIAGNOSIS — R262 Difficulty in walking, not elsewhere classified: Secondary | ICD-10-CM

## 2021-04-16 DIAGNOSIS — I480 Paroxysmal atrial fibrillation: Secondary | ICD-10-CM | POA: Diagnosis not present

## 2021-04-16 DIAGNOSIS — S81801D Unspecified open wound, right lower leg, subsequent encounter: Secondary | ICD-10-CM

## 2021-04-16 DIAGNOSIS — I5022 Chronic systolic (congestive) heart failure: Secondary | ICD-10-CM | POA: Diagnosis not present

## 2021-04-16 DIAGNOSIS — L97522 Non-pressure chronic ulcer of other part of left foot with fat layer exposed: Secondary | ICD-10-CM | POA: Diagnosis not present

## 2021-04-16 DIAGNOSIS — D631 Anemia in chronic kidney disease: Secondary | ICD-10-CM | POA: Diagnosis not present

## 2021-04-16 DIAGNOSIS — L84 Corns and callosities: Secondary | ICD-10-CM | POA: Diagnosis not present

## 2021-04-16 DIAGNOSIS — Z6841 Body Mass Index (BMI) 40.0 and over, adult: Secondary | ICD-10-CM | POA: Diagnosis not present

## 2021-04-16 DIAGNOSIS — E1142 Type 2 diabetes mellitus with diabetic polyneuropathy: Secondary | ICD-10-CM | POA: Diagnosis not present

## 2021-04-16 NOTE — Therapy (Addendum)
[facility] [address] [place], [place], [address] Phone: [phone]   Fax:  [phone]  Wound Care Therapy/ Progress note  Patient Details  Name: [name] MRN: [id number] Date of Birth: 24-Dec-1960 Referring Provider (PT): [name] MD   Encounter Date: 04/16/2021  Progress Note   Reporting Period 03/14/21 to 04/18/21   See note below for Objective Data and Assessment of Progress/Goals    PT End of Session - 04/16/21 1524     Visit Number 10    Number of Visits 16    Date for PT Re-Evaluation 05/06/21    Authorization Type BCBS (no auth, no 30VL)    Progress Note Due on Visit 18    PT Start Time 1420    PT Stop Time 1455    PT Time Calculation (min) 35 min    Activity Tolerance Patient tolerated treatment well    Behavior During Therapy [facility] for tasks assessed/performed             Past Medical History:  Diagnosis Date   Abscess 04/2017   Scrotal   Anemia    Arteriosclerotic cardiovascular disease (ASCVD)    MI-2000s; stent to the proximal LAD and diagonal in 2001; stress nuclear in 2008-impaired exercise capacity, left ventricular dilatation, moderately to severely depressed EF, apical, inferior and anteroseptal scar   Arthritis    Atrial flutter (HCC)    Bence-Jones proteinuria 05/05/2011   Cellulitis of leg    both legs   Chronic diarrhea    Chronic kidney disease, stage 3, mod decreased GFR (HCC)    Creatinine of 1.84 in 06/2011 and 1.5 in 07/2011   Diabetes mellitus    Insulin   Dysrhythmia    AFlutter   GERD (gastroesophageal reflux disease)    Gout    Hyperlipidemia    Hypertension    Injection site reaction    Multiple myeloma 07/01/2011   Myocardial infarction ([place]) 2000   Obesity    Pedal edema    Venous insufficiency   Sleep apnea    uses cpap   Ulcer     Past Surgical History:  Procedure Laterality Date   ABSCESS DRAINAGE     Scrotal   BIOPSY  01/02/2012   Procedure: BIOPSY;   Surgeon: Rogene Houston, MD;  [facility];  Service: Endoscopy;  Laterality: N/A;   BONE MARROW BIOPSY  05/13/11   CARDIAC CATHETERIZATION     cardiac stent   CARDIOVERSION N/A 10/13/2012   Procedure: CARDIOVERSION;  Surgeon: Yehuda Savannah, MD;  [facility];  Service: Cardiovascular;  Laterality: N/A;   CATARACT EXTRACTION W/PHACO Left 02/13/2014   Procedure: CATARACT EXTRACTION PHACO AND INTRAOCULAR LENS PLACEMENT ([place]);  Surgeon: Tonny Branch, MD;  [facility];  Service: Ophthalmology;  Laterality: Left;  CDE:  7.67   CATARACT EXTRACTION W/PHACO Right 03/02/2014   Procedure: CATARACT EXTRACTION PHACO AND INTRAOCULAR LENS PLACEMENT RIGHT EYE CDE=16.81;  Surgeon: Tonny Branch, MD;  [facility];  Service: Ophthalmology;  Laterality: Right;   COLONOSCOPY  11/28/2011   Procedure: COLONOSCOPY;  Surgeon: Rogene Houston, MD;  [facility];  Service: Endoscopy;  Laterality: N/A;  [place] Right 12/16/2017   Procedure: CYSTOSCOPY WITH RIGHT RETROGRADE PYELOGRAM AND RIGHT URETERAL STENT REMOVAL;  Surgeon: Cleon Gustin, MD;  [facility];  Service: Urology;  Laterality: Right;   CYSTOSCOPY W/ URETERAL STENT PLACEMENT Right 07/31/2017   Procedure:  CYSTOSCOPY WITH RETROGRADE PYELOGRAM/URETERAL STENT PLACEMENT;  Surgeon: Cleon Gustin, MD;  [facility];  Service: Urology;  Laterality: Right;   ESOPHAGOGASTRODUODENOSCOPY  01/02/2012   Procedure: ESOPHAGOGASTRODUODENOSCOPY (EGD);  Surgeon: Rogene Houston, MD;  [facility];  Service: Endoscopy;  Laterality: N/A;  100   ESOPHAGOGASTRODUODENOSCOPY N/A 09/20/2012   Procedure: ESOPHAGOGASTRODUODENOSCOPY (EGD);  Surgeon: Rogene Houston, MD;  [facility];  Service: Endoscopy;  Laterality: N/A;   EUS N/A 10/07/2012   Procedure: UPPER ENDOSCOPIC ULTRASOUND (EUS) LINEAR;  Surgeon: Milus Banister, MD;  [facility];  Service: Endoscopy;  Laterality: N/A;    INCISION AND DRAINAGE ABSCESS N/A 04/14/2017   Procedure: INCISION AND DRAINAGE ABSCESS;  Surgeon: Ceasar Mons, MD;  [facility];  Service: Urology;  Laterality: N/A;   INCISION AND DRAINAGE ABSCESS ANAL     IRRIGATION AND DEBRIDEMENT ABSCESS N/A 04/15/2017   Procedure: DEBRIDEMENT SCROTAL WOUND AND DRESSING CHANGE;  Surgeon: Ceasar Mons, MD;  [facility];  Service: Urology;  Laterality: N/A;   IRRIGATION AND DEBRIDEMENT ABSCESS N/A 04/17/2017   Procedure: IRRIGATION AND DEBRIDEMENT ABSCESS;  Surgeon: Ceasar Mons, MD;  [facility];  Service: Urology;  Laterality: N/A;  RM 3   LAPAROSCOPIC GASTRIC BANDING  2006   has been removed   OSTECTOMY Right 04/08/2017   Procedure: OSTECTOMY RIGHT GREAT TOE;  Surgeon: Caprice Beaver, DPM;  [facility];  Service: Podiatry;  Laterality: Right;   PORT-A-CATH REMOVAL Left 12/07/2012   Procedure: REMOVAL PORT-A-CATH;  Surgeon: Scherry Ran, MD;  [facility];  Service: General;  Laterality: Left;   PORT-A-CATH REMOVAL N/A 10/02/2017   Procedure: MINOR REMOVAL PORT-A-CATH AT BEDSIDE;  Surgeon: Donnie Mesa, MD;  Location: [place];  Service: General;  Laterality: N/A;   PORTACATH PLACEMENT  07/07/2011   Procedure: INSERTION PORT-A-CATH;  Surgeon: Scherry Ran, MD;  [facility];  Service: General;  Laterality: N/A;   PORTACATH PLACEMENT N/A 12/07/2012   Procedure: INSERTION PORT-A-CATH;  Surgeon: Scherry Ran, MD;  [facility];  Service: General;  Laterality: N/A;  Attempted portacath placement on left and right side   URETEROSCOPY Right 12/16/2017   Procedure: DIAGNOSTIC RIGHT URETEROSCOPY;  Surgeon: Cleon Gustin, MD;  [facility];  Service: Urology;  Laterality: Right;   WOUND DEBRIDEMENT Right 04/08/2017   Procedure: EXCISION ULCERATION RIGHT GREAT TOE;  Surgeon: Caprice Beaver, DPM;  [facility];  Service: Podiatry;  Laterality: Right;   WRIST SURGERY      Left; removal of bone fragment    There were no vitals filed for this visit.    Subjective Assessment - 04/16/21 1519     Subjective Pt arrived with dressings intact, no reoprts of pain currently.    Currently in Pain? No/denies                       Wound Therapy - 04/16/21 0001     Subjective Pt arrived with dressings intact, no reoprts of pain currently.    Patient and Family Stated Goals wound to heal    Date of Onset 02/12/21    Prior Treatments compression, neosporin and other topicals    Pain Scale 0-10    Pain Score 0-No pain    Evaluation and Treatment Procedures Explained to Patient/Family Yes    Evaluation and Treatment Procedures agreed to    Wound Properties Date First Assessed: 03/14/21 Time First Assessed: 0263  Wound Type: Skin tear Location: Pretibial Location Orientation: Distal;Right;Lateral Wound Description (Comments): lateral shin wound Present on Admission: Yes   Wound Image Images linked: 1    Dressing Type Compression wrap   vaseline, medihoney, 2x2, profore with netting   Dressing Changed Changed    Dressing Status Old drainage    Dressing Change Frequency PRN    Site / Wound Assessment Granulation tissue    % Wound base Red or Granulating 100%    Wound Length (cm) 1 cm    Wound Width (cm) 1 cm    Wound Depth (cm) 0.2 cm    Wound Volume (cm^3) 0.2 cm^3    Wound Surface Area (cm^2) 1 cm^2    Undermining (cm) 12-3:00    Drainage Amount Minimal    Drainage Description Sanguineous    Treatment Cleansed    Wound Therapy - Clinical Statement Received order for compression.  Rt LE cleansed and removal of slough from wound bed.  Continued with medihoney, vaseline perimeter and profore wiht additional cotton for cone shape.  Reports of comfort at EOS.    Wound Therapy - Functional Problem List walking    Factors Delaying/Impairing Wound Healing Diabetes Mellitus;Multiple medical problems    Hydrotherapy Plan Debridement;Dressing  change;Patient/family education;Pulsatile lavage with suction    Wound Therapy - Frequency 2X / week    Wound Therapy - Current Recommendations PT    Wound Plan dressing change, F/U wiht culture    Dressing  vaseline, medihoney 4x4 and profore dressing .                       PT Short Term Goals - 04/16/21 1525       PT SHORT TERM GOAL #1   Title Patient wound sizes to decrease 50% to decrease risk of infection.    Baseline 04/16/21:  1 wound remains, increased in size with culture complete this session    Status Partially Met      PT SHORT TERM GOAL #2   Title Patient to experience pain no more than 1/10 in wound area in order to show general improvement of condition     Status Achieved               PT Long Term Goals - 04/16/21 1526       PT LONG TERM GOAL #1   Title Patient to demonstrate full wound healing in order to show resolution of condition     Status On-going      PT LONG TERM GOAL #2   Title Patient and family to be independent in skin care strategies and edema control in order to assist in prevention of other wound formation     Baseline 11/15: Pt verbalized need for return to compression garment once wound is healed    Status Partially Met            Assessment Patient has met 1/2 short term goals with remaining goal partially met at this time with decreased pain and wound size. Patient has partially met 1/2 long term goals with ability to verbalize skin care/edema control strategies. Remaining goal not met due to continued wound present. Patient began therapy with several wounds present but with only one wound remaining at this time. Patient will continue to benefit from skilled physical therapy to promote wound healing.   2:55 PM, 04/18/21 Mearl Latin PT, DPT Physical Therapist at [facility]      Patient  will benefit from skilled therapeutic intervention in order to improve the following deficits and  impairments:     Visit Diagnosis: Difficulty in walking, not elsewhere classified  Wound of right leg, subsequent encounter     Problem List Patient Active Problem List   Diagnosis Date Noted   Multiple myeloma in remission ([place]) 11/10/2017   CKD (chronic kidney disease) stage 3, GFR 30-59 ml/min (HCC) 42/35/3614   Chronic systolic CHF (congestive heart failure) ([place]) 11/10/2017   MRSA bacteremia 10/03/2017   Pressure injury of skin 08/04/2017   Fournier's gangrene of scrotum 04/14/2017   Hypogammaglobulinemia ([place]) 03/09/2016   Diaphragmatic hernia without obstruction 43/15/4008   Chronic systolic heart failure ([place]) 12/19/2013   Anemia, normocytic normochromic 10/09/2012   Chronic pancreatitis ([place]) 10/07/2012   Atrial flutter ([place]) 09/17/2012   DDD (degenerative disc disease), cervical 03/18/2012   Arteriosclerotic cardiovascular disease (ASCVD)    Hypertension    Hyperlipidemia    Multiple myeloma ([place]) 07/01/2011   Morbid obesity ([place]) 04/29/2010   Insulin dependent diabetes mellitus 11/15/2008   OSA on CPAP 11/15/2008   Ihor Austin, LPTA/CLT; CBIS [phone]  Aldona Lento 04/16/2021, 3:30 PM  [facility] [address] [place], [place], [address] Phone: [phone]   Fax:  [phone]  Name: MONICO SUDDUTH MRN: [id number] Date of Birth: Mar 22, 1961

## 2021-04-18 ENCOUNTER — Ambulatory Visit (HOSPITAL_COMMUNITY): Payer: BC Managed Care – PPO | Admitting: Physical Therapy

## 2021-04-19 ENCOUNTER — Ambulatory Visit (HOSPITAL_COMMUNITY): Payer: BC Managed Care – PPO | Admitting: Physical Therapy

## 2021-04-19 ENCOUNTER — Encounter (HOSPITAL_COMMUNITY): Payer: Self-pay | Admitting: Physical Therapy

## 2021-04-19 ENCOUNTER — Other Ambulatory Visit: Payer: Self-pay

## 2021-04-19 DIAGNOSIS — S81801D Unspecified open wound, right lower leg, subsequent encounter: Secondary | ICD-10-CM | POA: Diagnosis not present

## 2021-04-19 DIAGNOSIS — R262 Difficulty in walking, not elsewhere classified: Secondary | ICD-10-CM

## 2021-04-19 NOTE — Therapy (Signed)
[place], [place], [address] Phone: [phone]   Fax:  [phone]  Wound Care Therapy  Patient Details  Name: [name] MRN: [id number] Date of Birth: November 25, 1960 Referring Provider (PT): [name] MD   Encounter Date: 04/19/2021   PT End of Session - 04/19/21 1622     Visit Number 11    Number of Visits 16    Date for PT Re-Evaluation 05/06/21    Authorization Type BCBS (no auth, no 30VL)    Progress Note Due on Visit 18    PT Start Time 1623   late to apt   PT Stop Time 1655    PT Time Calculation (min) 32 min    Activity Tolerance Patient tolerated treatment well    Behavior During Therapy [facility] for tasks assessed/performed             Past Medical History:  Diagnosis Date   Abscess 04/2017   Scrotal   Anemia    Arteriosclerotic cardiovascular disease (ASCVD)    MI-2000s; stent to the proximal LAD and diagonal in 2001; stress nuclear in 2008-impaired exercise capacity, left ventricular dilatation, moderately to severely depressed EF, apical, inferior and anteroseptal scar   Arthritis    Atrial flutter (HCC)    Bence-Jones proteinuria 05/05/2011   Cellulitis of leg    both legs   Chronic diarrhea    Chronic kidney disease, stage 3, mod decreased GFR (HCC)    Creatinine of 1.84 in 06/2011 and 1.5 in 07/2011   Diabetes mellitus    Insulin   Dysrhythmia    AFlutter   GERD (gastroesophageal reflux disease)    Gout    Hyperlipidemia    Hypertension    Injection site reaction    Multiple myeloma 07/01/2011   Myocardial infarction ([place]) 2000   Obesity    Pedal edema    Venous insufficiency   Sleep apnea    uses cpap   Ulcer     Past Surgical History:  Procedure Laterality Date   ABSCESS DRAINAGE     Scrotal   BIOPSY  01/02/2012   Procedure: BIOPSY;  Surgeon: Rogene Houston, MD;  [facility];  Service: Endoscopy;  Laterality: N/A;   BONE MARROW BIOPSY  05/13/11    CARDIAC CATHETERIZATION     cardiac stent   CARDIOVERSION N/A 10/13/2012   Procedure: CARDIOVERSION;  Surgeon: Yehuda Savannah, MD;  Location: [facility];  Service: Cardiovascular;  Laterality: N/A;   CATARACT EXTRACTION W/PHACO Left 02/13/2014   Procedure: CATARACT EXTRACTION PHACO AND INTRAOCULAR LENS PLACEMENT ([place]);  Surgeon: Tonny Branch, MD;  Location: [facility];  Service: Ophthalmology;  Laterality: Left;  CDE:  7.67   CATARACT EXTRACTION W/PHACO Right 03/02/2014   Procedure: CATARACT EXTRACTION PHACO AND INTRAOCULAR LENS PLACEMENT RIGHT EYE CDE=16.81;  Surgeon: Tonny Branch, MD;  Location: [facility];  Service: Ophthalmology;  Laterality: Right;   COLONOSCOPY  11/28/2011   Procedure: COLONOSCOPY;  Surgeon: Rogene Houston, MD;  [facility];  Service: Endoscopy;  Laterality: N/A;  [place] Right 12/16/2017   Procedure: CYSTOSCOPY WITH RIGHT RETROGRADE PYELOGRAM AND RIGHT URETERAL STENT REMOVAL;  Surgeon: Cleon Gustin, MD;  Location: [facility];  Service: Urology;  Laterality: Right;   CYSTOSCOPY W/ URETERAL STENT PLACEMENT Right 07/31/2017   Procedure: CYSTOSCOPY WITH RETROGRADE PYELOGRAM/URETERAL STENT PLACEMENT;  Surgeon: Cleon Gustin, MD;  [facility];  Service: Urology;  Laterality:  Right;   ESOPHAGOGASTRODUODENOSCOPY  01/02/2012   Procedure: ESOPHAGOGASTRODUODENOSCOPY (EGD);  Surgeon: Rogene Houston, MD;  [facility];  Service: Endoscopy;  Laterality: N/A;  100   ESOPHAGOGASTRODUODENOSCOPY N/A 09/20/2012   Procedure: ESOPHAGOGASTRODUODENOSCOPY (EGD);  Surgeon: Rogene Houston, MD;  [facility];  Service: Endoscopy;  Laterality: N/A;   EUS N/A 10/07/2012   Procedure: UPPER ENDOSCOPIC ULTRASOUND (EUS) LINEAR;  Surgeon: Milus Banister, MD;  [facility];  Service: Endoscopy;  Laterality: N/A;   INCISION AND DRAINAGE ABSCESS N/A 04/14/2017   Procedure: INCISION AND DRAINAGE ABSCESS;  Surgeon: Ceasar Mons, MD;   [facility];  Service: Urology;  Laterality: N/A;   INCISION AND DRAINAGE ABSCESS ANAL     IRRIGATION AND DEBRIDEMENT ABSCESS N/A 04/15/2017   Procedure: DEBRIDEMENT SCROTAL WOUND AND DRESSING CHANGE;  Surgeon: Ceasar Mons, MD;  [facility];  Service: Urology;  Laterality: N/A;   IRRIGATION AND DEBRIDEMENT ABSCESS N/A 04/17/2017   Procedure: IRRIGATION AND DEBRIDEMENT ABSCESS;  Surgeon: Ceasar Mons, MD;  [facility];  Service: Urology;  Laterality: N/A;  RM 3   LAPAROSCOPIC GASTRIC BANDING  2006   has been removed   OSTECTOMY Right 04/08/2017   Procedure: OSTECTOMY RIGHT GREAT TOE;  Surgeon: Caprice Beaver, DPM;  [facility];  Service: Podiatry;  Laterality: Right;   PORT-A-CATH REMOVAL Left 12/07/2012   Procedure: REMOVAL PORT-A-CATH;  Surgeon: Scherry Ran, MD;  [facility];  Service: General;  Laterality: Left;   PORT-A-CATH REMOVAL N/A 10/02/2017   Procedure: MINOR REMOVAL PORT-A-CATH AT BEDSIDE;  Surgeon: Donnie Mesa, MD;  Location: [place];  Service: General;  Laterality: N/A;   PORTACATH PLACEMENT  07/07/2011   Procedure: INSERTION PORT-A-CATH;  Surgeon: Scherry Ran, MD;  [facility];  Service: General;  Laterality: N/A;   PORTACATH PLACEMENT N/A 12/07/2012   Procedure: INSERTION PORT-A-CATH;  Surgeon: Scherry Ran, MD;  [facility];  Service: General;  Laterality: N/A;  Attempted portacath placement on left and right side   URETEROSCOPY Right 12/16/2017   Procedure: DIAGNOSTIC RIGHT URETEROSCOPY;  Surgeon: Cleon Gustin, MD;  [facility];  Service: Urology;  Laterality: Right;   WOUND DEBRIDEMENT Right 04/08/2017   Procedure: EXCISION ULCERATION RIGHT GREAT TOE;  Surgeon: Caprice Beaver, DPM;  Location: [facility];  Service: Podiatry;  Laterality: Right;   WRIST SURGERY     Left; removal of bone fragment    There were no vitals filed for this visit.      [facility] PT Assessment - 04/19/21 0001        Assessment   Medical Diagnosis Open wound of R lower Leg    [name]                     Wound Therapy - 04/19/21 0001     Subjective Patient reports no pain and dressing able to stay in place since last session.    Patient and Family Stated Goals wound to heal    Date of Onset 02/12/21    Prior Treatments compression, neosporin and other topicals    Pain Scale 0-10    Pain Score 0-No pain    Evaluation and Treatment Procedures Explained to Patient/Family Yes    Evaluation and Treatment Procedures agreed to    Wound Properties Date First Assessed: 03/14/21 Time First Assessed: 0745 Wound Type: Skin tear Location: Pretibial Location Orientation: Distal;Right;Lateral Wound Description (Comments):  lateral shin wound Present on Admission: Yes   Dressing Type Compression wrap    Dressing Changed Changed    Dressing Status Old drainage    Dressing Change Frequency PRN    Site / Wound Assessment Granulation tissue    % Wound base Red or Granulating 100%   after debridement   Drainage Amount Minimal    Drainage Description Serosanguineous    Treatment Cleansed;Debridement (Selective)    Selective Debridement - Location wound edges, wound bed    Selective Debridement - Tools Used Forceps    Selective Debridement - Tissue Removed devitalized tissue    Wound Therapy - Clinical Statement Patient with hard white bone like object at the 12 o'clock region of wound. Wiggled and eventually came free (looked like calcium deposit). Able to debride slough and open edges. Expect this was keeping wound from healing. Continued with xeroform, lotion and vaseoline and profore lite.    Wound Therapy - Functional Problem List walking    Factors Delaying/Impairing Wound Healing Diabetes Mellitus;Multiple medical problems    Hydrotherapy Plan Debridement;Dressing change;Patient/family education;Pulsatile lavage with suction    Wound Therapy - Frequency 2X / week     Wound Therapy - Current Recommendations PT    Wound Plan dressing change, F/U wiht culture    Dressing  vaseline, xeroform 4x4 and profore dressing .                       PT Short Term Goals - 04/16/21 1525       PT SHORT TERM GOAL #1   Title Patient wound sizes to decrease 50% to decrease risk of infection.    Baseline 04/16/21:  1 wound remains, increased in size with culture complete this session    Status Partially Met      PT SHORT TERM GOAL #2   Title Patient to experience pain no more than 1/10 in wound area in order to show general improvement of condition     Status Achieved               PT Long Term Goals - 04/16/21 1526       PT LONG TERM GOAL #1   Title Patient to demonstrate full wound healing in order to show resolution of condition     Status On-going      PT LONG TERM GOAL #2   Title Patient and family to be independent in skin care strategies and edema control in order to assist in prevention of other wound formation     Baseline 11/15: Pt verbalized need for return to compression garment once wound is healed    Status Partially Met                   Plan - 04/19/21 1622     Clinical Impression Statement see above    Personal Factors and Comorbidities Comorbidity 3+    Comorbidities CKD, HTN, HLD, hx MI, CVD, DM, lymphedema    Examination-Activity Limitations Bathing;Locomotion Level;Transfers;Stand    Examination-Participation Restrictions Occupation;Community Activity    Stability/Clinical Decision Making Stable/Uncomplicated    Rehab Potential Good    PT Frequency 2x / week    PT Duration 4 weeks    PT Treatment/Interventions ADLs/Self Care Home Management;Cryotherapy;Electrical Stimulation;Moist Heat;Gait training;Stair training;Functional mobility training;Therapeutic activities;Therapeutic exercise;Balance training;Neuromuscular re-education;Patient/family education;Manual lymph drainage;Manual techniques;Compression  bandaging;Scar mobilization    PT Next Visit Plan change dressings and debride PRN to promote wound healing    Consulted  and Agree with Plan of Care Patient             Patient will benefit from skilled therapeutic intervention in order to improve the following deficits and impairments:  Abnormal gait, Difficulty walking, Decreased skin integrity  Visit Diagnosis: Difficulty in walking, not elsewhere classified  Wound of right leg, subsequent encounter     Problem List Patient Active Problem List   Diagnosis Date Noted   Multiple myeloma in remission ([place]) 11/10/2017   CKD (chronic kidney disease) stage 3, GFR 30-59 ml/min (HCC) 84/66/5993   Chronic systolic CHF (congestive heart failure) ([place]) 11/10/2017   MRSA bacteremia 10/03/2017   Pressure injury of skin 08/04/2017   Fournier's gangrene of scrotum 04/14/2017   Hypogammaglobulinemia ([place]) 03/09/2016   Diaphragmatic hernia without obstruction 57/06/7791   Chronic systolic heart failure ([place]) 12/19/2013   Anemia, normocytic normochromic 10/09/2012   Chronic pancreatitis ([place]) 10/07/2012   Atrial flutter ([place]) 09/17/2012   DDD (degenerative disc disease), cervical 03/18/2012   Arteriosclerotic cardiovascular disease (ASCVD)    Hypertension    Hyperlipidemia    Multiple myeloma ([place]) 07/01/2011   Morbid obesity ([place]) 04/29/2010   Insulin dependent diabetes mellitus 11/15/2008   OSA on CPAP 11/15/2008    5:01 PM, 04/19/21 Jerene Pitch, DPT Physical Therapy with [facility]  [phone] office   [place], [place], [address] Phone: [phone]   Fax:  [phone]  Name: [name] MRN: [id number] Date of Birth: 1960/08/25

## 2021-04-20 LAB — AEROBIC CULTURE W GRAM STAIN (SUPERFICIAL SPECIMEN): Gram Stain: NONE SEEN

## 2021-04-22 ENCOUNTER — Encounter (HOSPITAL_COMMUNITY): Payer: Self-pay | Admitting: Physical Therapy

## 2021-04-22 ENCOUNTER — Ambulatory Visit (HOSPITAL_COMMUNITY): Payer: BC Managed Care – PPO | Admitting: Physical Therapy

## 2021-04-22 ENCOUNTER — Other Ambulatory Visit: Payer: Self-pay

## 2021-04-22 DIAGNOSIS — R262 Difficulty in walking, not elsewhere classified: Secondary | ICD-10-CM

## 2021-04-22 DIAGNOSIS — S81801D Unspecified open wound, right lower leg, subsequent encounter: Secondary | ICD-10-CM

## 2021-04-22 NOTE — Therapy (Signed)
[name], [place], [address] Phone: [phone]   Fax:  [phone]  Wound Care Therapy  Patient Details  Name: [name] MRN: [id number] Date of Birth: 1961-02-17 Referring Provider (PT): [name] MD   Encounter Date: 04/22/2021   PT End of Session - 04/22/21 0830     Visit Number 12    Number of Visits 16    Date for PT Re-Evaluation 05/06/21    Authorization Type BCBS (no auth, no 30VL)    Progress Note Due on Visit 18    PT Start Time [phone]    PT Stop Time 0900    PT Time Calculation (min) 28 min    Activity Tolerance Patient tolerated treatment well    Behavior During Therapy [facility] for tasks assessed/performed             Past Medical History:  Diagnosis Date   Abscess 04/2017   Scrotal   Anemia    Arteriosclerotic cardiovascular disease (ASCVD)    MI-2000s; stent to the proximal LAD and diagonal in 2001; stress nuclear in 2008-impaired exercise capacity, left ventricular dilatation, moderately to severely depressed EF, apical, inferior and anteroseptal scar   Arthritis    Atrial flutter (HCC)    Bence-Jones proteinuria 05/05/2011   Cellulitis of leg    both legs   Chronic diarrhea    Chronic kidney disease, stage 3, mod decreased GFR (HCC)    Creatinine of 1.84 in 06/2011 and 1.5 in 07/2011   Diabetes mellitus    Insulin   Dysrhythmia    AFlutter   GERD (gastroesophageal reflux disease)    Gout    Hyperlipidemia    Hypertension    Injection site reaction    Multiple myeloma 07/01/2011   Myocardial infarction ([place]) 2000   Obesity    Pedal edema    Venous insufficiency   Sleep apnea    uses cpap   Ulcer     Past Surgical History:  Procedure Laterality Date   ABSCESS DRAINAGE     Scrotal   BIOPSY  01/02/2012   Procedure: BIOPSY;  Surgeon: Rogene Houston, MD;  [facility];  Service: Endoscopy;  Laterality: N/A;   BONE MARROW BIOPSY  05/13/11   CARDIAC  CATHETERIZATION     cardiac stent   CARDIOVERSION N/A 10/13/2012   Procedure: CARDIOVERSION;  Surgeon: Yehuda Savannah, MD;  Location: [facility];  Service: Cardiovascular;  Laterality: N/A;   CATARACT EXTRACTION W/PHACO Left 02/13/2014   Procedure: CATARACT EXTRACTION PHACO AND INTRAOCULAR LENS PLACEMENT ([place]);  Surgeon: Tonny Branch, MD;  Location: [facility];  Service: Ophthalmology;  Laterality: Left;  CDE:  7.67   CATARACT EXTRACTION W/PHACO Right 03/02/2014   Procedure: CATARACT EXTRACTION PHACO AND INTRAOCULAR LENS PLACEMENT RIGHT EYE CDE=16.81;  Surgeon: Tonny Branch, MD;  Location: [facility];  Service: Ophthalmology;  Laterality: Right;   COLONOSCOPY  11/28/2011   Procedure: COLONOSCOPY;  Surgeon: Rogene Houston, MD;  [facility];  Service: Endoscopy;  Laterality: N/A;  [place] Right 12/16/2017   Procedure: CYSTOSCOPY WITH RIGHT RETROGRADE PYELOGRAM AND RIGHT URETERAL STENT REMOVAL;  Surgeon: Cleon Gustin, MD;  Location: [facility];  Service: Urology;  Laterality: Right;   CYSTOSCOPY W/ URETERAL STENT PLACEMENT Right 07/31/2017   Procedure: CYSTOSCOPY WITH RETROGRADE PYELOGRAM/URETERAL STENT PLACEMENT;  Surgeon: Cleon Gustin, MD;  [facility];  Service: Urology;  Laterality: Right;   ESOPHAGOGASTRODUODENOSCOPY  01/02/2012   Procedure: ESOPHAGOGASTRODUODENOSCOPY (EGD);  Surgeon: Rogene Houston, MD;  [facility];  Service: Endoscopy;  Laterality: N/A;  100   ESOPHAGOGASTRODUODENOSCOPY N/A 09/20/2012   Procedure: ESOPHAGOGASTRODUODENOSCOPY (EGD);  Surgeon: Rogene Houston, MD;  [facility];  Service: Endoscopy;  Laterality: N/A;   EUS N/A 10/07/2012   Procedure: UPPER ENDOSCOPIC ULTRASOUND (EUS) LINEAR;  Surgeon: Milus Banister, MD;  [facility];  Service: Endoscopy;  Laterality: N/A;   INCISION AND DRAINAGE ABSCESS N/A 04/14/2017   Procedure: INCISION AND DRAINAGE ABSCESS;  Surgeon: Ceasar Mons, MD;  [facility]  [facility];  Service: Urology;  Laterality: N/A;   INCISION AND DRAINAGE ABSCESS ANAL     IRRIGATION AND DEBRIDEMENT ABSCESS N/A 04/15/2017   Procedure: DEBRIDEMENT SCROTAL WOUND AND DRESSING CHANGE;  Surgeon: Ceasar Mons, MD;  [facility];  Service: Urology;  Laterality: N/A;   IRRIGATION AND DEBRIDEMENT ABSCESS N/A 04/17/2017   Procedure: IRRIGATION AND DEBRIDEMENT ABSCESS;  Surgeon: Ceasar Mons, MD;  [facility];  Service: Urology;  Laterality: N/A;  RM 3   LAPAROSCOPIC GASTRIC BANDING  2006   has been removed   OSTECTOMY Right 04/08/2017   Procedure: OSTECTOMY RIGHT GREAT TOE;  Surgeon: Caprice Beaver, DPM;  Location: [facility];  Service: Podiatry;  Laterality: Right;   PORT-A-CATH REMOVAL Left 12/07/2012   Procedure: REMOVAL PORT-A-CATH;  Surgeon: Scherry Ran, MD;  Location: [facility];  Service: General;  Laterality: Left;   PORT-A-CATH REMOVAL N/A 10/02/2017   Procedure: MINOR REMOVAL PORT-A-CATH AT BEDSIDE;  Surgeon: Donnie Mesa, MD;  Location: [place];  Service: General;  Laterality: N/A;   PORTACATH PLACEMENT  07/07/2011   Procedure: INSERTION PORT-A-CATH;  Surgeon: Scherry Ran, MD;  Location: [facility];  Service: General;  Laterality: N/A;   PORTACATH PLACEMENT N/A 12/07/2012   Procedure: INSERTION PORT-A-CATH;  Surgeon: Scherry Ran, MD;  Location: [facility];  Service: General;  Laterality: N/A;  Attempted portacath placement on left and right side   URETEROSCOPY Right 12/16/2017   Procedure: DIAGNOSTIC RIGHT URETEROSCOPY;  Surgeon: Cleon Gustin, MD;  Location: [facility];  Service: Urology;  Laterality: Right;   WOUND DEBRIDEMENT Right 04/08/2017   Procedure: EXCISION ULCERATION RIGHT GREAT TOE;  Surgeon: Caprice Beaver, DPM;  Location: [facility];  Service: Podiatry;  Laterality: Right;   WRIST SURGERY     Left; removal of bone fragment    There were no vitals filed for this visit.               Wound Therapy - 04/22/21 0001      Subjective No pain in leg    Patient and Family Stated Goals wound to heal    Date of Onset 02/12/21    Prior Treatments compression, neosporin and other topicals    Pain Score 0-No pain    Evaluation and Treatment Procedures Explained to Patient/Family Yes    Evaluation and Treatment Procedures agreed to    Wound Properties Date First Assessed: 03/14/21 Time First Assessed: 0745 Wound Type: Skin tear Location: Pretibial Location Orientation: Distal;Right;Lateral Wound Description (Comments): lateral shin wound Present on Admission: Yes   Dressing Type Compression wrap    Dressing Changed Changed    Dressing Status Old drainage    Dressing Change Frequency PRN    Site / Wound Assessment Granulation tissue    % Wound base Red or Granulating 100%    Drainage Amount Minimal    Drainage Description Serosanguineous  Treatment Cleansed;Debridement (Selective)    Selective Debridement - Location wound edges, wound bed    Selective Debridement - Tools Used Forceps    Selective Debridement - Tissue Removed devitalized tissue    Wound Therapy - Clinical Statement Patient's wound appears to be healing well. Able to remove all slough today from wound bed. He tolerates debridement well. Continued with xeroform, vaseline and profore.    Wound Therapy - Functional Problem List walking    Factors Delaying/Impairing Wound Healing Diabetes Mellitus;Multiple medical problems    Hydrotherapy Plan Debridement;Dressing change;Patient/family education;Pulsatile lavage with suction    Wound Therapy - Frequency 2X / week    Wound Therapy - Current Recommendations PT    Wound Plan dressing change, F/U wiht culture    Dressing  vaseline, xeroform 4x4 and profore dressing .                       PT Short Term Goals - 04/16/21 1525       PT SHORT TERM GOAL #1   Title Patient wound sizes to decrease 50% to decrease risk of infection.    Baseline 04/16/21:  1 wound remains, increased in size  with culture complete this session    Status Partially Met      PT SHORT TERM GOAL #2   Title Patient to experience pain no more than 1/10 in wound area in order to show general improvement of condition     Status Achieved               PT Long Term Goals - 04/16/21 1526       PT LONG TERM GOAL #1   Title Patient to demonstrate full wound healing in order to show resolution of condition     Status On-going      PT LONG TERM GOAL #2   Title Patient and family to be independent in skin care strategies and edema control in order to assist in prevention of other wound formation     Baseline 11/15: Pt verbalized need for return to compression garment once wound is healed    Status Partially Met                   Plan - 04/22/21 0832     Clinical Impression Statement see above    Personal Factors and Comorbidities Comorbidity 3+    Comorbidities CKD, HTN, HLD, hx MI, CVD, DM, lymphedema    Examination-Activity Limitations Bathing;Locomotion Level;Transfers;Stand    Examination-Participation Restrictions Occupation;Community Activity    Stability/Clinical Decision Making Stable/Uncomplicated    Rehab Potential Good    PT Frequency 2x / week    PT Duration 4 weeks    PT Treatment/Interventions ADLs/Self Care Home Management;Cryotherapy;Electrical Stimulation;Moist Heat;Gait training;Stair training;Functional mobility training;Therapeutic activities;Therapeutic exercise;Balance training;Neuromuscular re-education;Patient/family education;Manual lymph drainage;Manual techniques;Compression bandaging;Scar mobilization    PT Next Visit Plan change dressings and debride PRN to promote wound healing    Consulted and Agree with Plan of Care Patient             Patient will benefit from skilled therapeutic intervention in order to improve the following deficits and impairments:  Abnormal gait, Difficulty walking, Decreased skin integrity  Visit Diagnosis: Difficulty in  walking, not elsewhere classified  Wound of right leg, subsequent encounter     Problem List Patient Active Problem List   Diagnosis Date Noted   Multiple myeloma in remission ([place]) 11/10/2017   CKD (chronic kidney disease) stage 3,  GFR 30-59 ml/min (HCC) 95/28/4132   Chronic systolic CHF (congestive heart failure) ([place]) 11/10/2017   MRSA bacteremia 10/03/2017   Pressure injury of skin 08/04/2017   Fournier's gangrene of scrotum 04/14/2017   Hypogammaglobulinemia ([place]) 03/09/2016   Diaphragmatic hernia without obstruction 44/06/270   Chronic systolic heart failure ([place]) 12/19/2013   Anemia, normocytic normochromic 10/09/2012   Chronic pancreatitis ([place]) 10/07/2012   Atrial flutter ([place]) 09/17/2012   DDD (degenerative disc disease), cervical 03/18/2012   Arteriosclerotic cardiovascular disease (ASCVD)    Hypertension    Hyperlipidemia    Multiple myeloma ([place]) 07/01/2011   Morbid obesity ([place]) 04/29/2010   Insulin dependent diabetes mellitus 11/15/2008   OSA on CPAP 11/15/2008    9:05 AM, 04/22/21 Mearl Latin PT, DPT Physical Therapist at [place], [place], [address] Phone: [phone]   Fax:  [phone]  Name: CACE OSORTO MRN: [id number] Date of Birth: January 02, 1961

## 2021-04-23 ENCOUNTER — Encounter (HOSPITAL_COMMUNITY)
Admission: RE | Admit: 2021-04-23 | Discharge: 2021-04-23 | Disposition: A | Discharge status: Home / Self Care | Payer: BC Managed Care – PPO | Source: Ambulatory Visit | Attending: Nephrology | Admitting: Nephrology

## 2021-04-23 DIAGNOSIS — N184 Chronic kidney disease, stage 4 (severe): Secondary | ICD-10-CM | POA: Diagnosis not present

## 2021-04-23 DIAGNOSIS — G4733 Obstructive sleep apnea (adult) (pediatric): Secondary | ICD-10-CM | POA: Diagnosis not present

## 2021-04-23 DIAGNOSIS — D631 Anemia in chronic kidney disease: Secondary | ICD-10-CM | POA: Diagnosis not present

## 2021-04-23 LAB — POCT HEMOGLOBIN-HEMACUE: Hemoglobin: 10.6 g/dL — ABNORMAL LOW (ref 13.0–17.0)

## 2021-04-23 MED ORDER — EPOETIN ALFA-EPBX 10000 UNIT/ML IJ SOLN: 10000.0000 [IU] | Freq: Once | INTRAMUSCULAR | Status: DC

## 2021-04-24 ENCOUNTER — Ambulatory Visit (HOSPITAL_COMMUNITY): Payer: BC Managed Care – PPO

## 2021-04-24 ENCOUNTER — Other Ambulatory Visit: Payer: Self-pay

## 2021-04-24 ENCOUNTER — Encounter (HOSPITAL_COMMUNITY): Payer: Self-pay

## 2021-04-24 ENCOUNTER — Ambulatory Visit: Payer: BC Managed Care – PPO | Admitting: Cardiology

## 2021-04-24 DIAGNOSIS — R262 Difficulty in walking, not elsewhere classified: Secondary | ICD-10-CM | POA: Diagnosis not present

## 2021-04-24 DIAGNOSIS — S81801D Unspecified open wound, right lower leg, subsequent encounter: Secondary | ICD-10-CM | POA: Diagnosis not present

## 2021-04-24 NOTE — Therapy (Signed)
[place], [place], [address] Phone: [phone]   Fax:  [phone]  Wound Care Therapy  Patient Details  Name: [name] MRN: [id number] Date of Birth: 1960-06-09 Referring Provider (PT): [name] MD   Encounter Date: 04/24/2021   PT End of Session - 04/24/21 0915     Visit Number 13    Number of Visits 16    Date for PT Re-Evaluation 05/06/21    Authorization Type BCBS (no auth, no 30VL)    Progress Note Due on Visit 18    PT Start Time 0828    PT Stop Time 0900    PT Time Calculation (min) 32 min    Activity Tolerance Patient tolerated treatment well    Behavior During Therapy [facility] for tasks assessed/performed             Past Medical History:  Diagnosis Date   Abscess 04/2017   Scrotal   Anemia    Arteriosclerotic cardiovascular disease (ASCVD)    MI-2000s; stent to the proximal LAD and diagonal in 2001; stress nuclear in 2008-impaired exercise capacity, left ventricular dilatation, moderately to severely depressed EF, apical, inferior and anteroseptal scar   Arthritis    Atrial flutter (HCC)    Bence-Jones proteinuria 05/05/2011   Cellulitis of leg    both legs   Chronic diarrhea    Chronic kidney disease, stage 3, mod decreased GFR (HCC)    Creatinine of 1.84 in 06/2011 and 1.5 in 07/2011   Diabetes mellitus    Insulin   Dysrhythmia    AFlutter   GERD (gastroesophageal reflux disease)    Gout    Hyperlipidemia    Hypertension    Injection site reaction    Multiple myeloma 07/01/2011   Myocardial infarction ([place]) 2000   Obesity    Pedal edema    Venous insufficiency   Sleep apnea    uses cpap   Ulcer     Past Surgical History:  Procedure Laterality Date   ABSCESS DRAINAGE     Scrotal   BIOPSY  01/02/2012   Procedure: BIOPSY;  Surgeon: Rogene Houston, MD;  [facility];  Service: Endoscopy;  Laterality: N/A;   BONE MARROW BIOPSY  05/13/11   CARDIAC  CATHETERIZATION     cardiac stent   CARDIOVERSION N/A 10/13/2012   Procedure: CARDIOVERSION;  Surgeon: Yehuda Savannah, MD;  Location: [facility];  Service: Cardiovascular;  Laterality: N/A;   CATARACT EXTRACTION W/PHACO Left 02/13/2014   Procedure: CATARACT EXTRACTION PHACO AND INTRAOCULAR LENS PLACEMENT ([place]);  Surgeon: Tonny Branch, MD;  Location: [facility];  Service: Ophthalmology;  Laterality: Left;  CDE:  7.67   CATARACT EXTRACTION W/PHACO Right 03/02/2014   Procedure: CATARACT EXTRACTION PHACO AND INTRAOCULAR LENS PLACEMENT RIGHT EYE CDE=16.81;  Surgeon: Tonny Branch, MD;  Location: [facility];  Service: Ophthalmology;  Laterality: Right;   COLONOSCOPY  11/28/2011   Procedure: COLONOSCOPY;  Surgeon: Rogene Houston, MD;  [facility];  Service: Endoscopy;  Laterality: N/A;  [place] Right 12/16/2017   Procedure: CYSTOSCOPY WITH RIGHT RETROGRADE PYELOGRAM AND RIGHT URETERAL STENT REMOVAL;  Surgeon: [place] Gustin, MD;  Location: [facility];  Service: Urology;  Laterality: Right;   CYSTOSCOPY W/ URETERAL STENT PLACEMENT Right 07/31/2017   Procedure: CYSTOSCOPY WITH RETROGRADE PYELOGRAM/URETERAL STENT PLACEMENT;  Surgeon: [place] Gustin, MD;  [facility];  Service: Urology;  Laterality: Right;   ESOPHAGOGASTRODUODENOSCOPY  01/02/2012   Procedure: ESOPHAGOGASTRODUODENOSCOPY (EGD);  Surgeon: Rogene Houston, MD;  [facility];  Service: Endoscopy;  Laterality: N/A;  100   ESOPHAGOGASTRODUODENOSCOPY N/A 09/20/2012   Procedure: ESOPHAGOGASTRODUODENOSCOPY (EGD);  Surgeon: Rogene Houston, MD;  [facility];  Service: Endoscopy;  Laterality: N/A;   EUS N/A 10/07/2012   Procedure: UPPER ENDOSCOPIC ULTRASOUND (EUS) LINEAR;  Surgeon: Milus Banister, MD;  [facility];  Service: Endoscopy;  Laterality: N/A;   INCISION AND DRAINAGE ABSCESS N/A 04/14/2017   Procedure: INCISION AND DRAINAGE ABSCESS;  Surgeon: Ceasar Mons, MD;  [facility]  [facility];  Service: Urology;  Laterality: N/A;   INCISION AND DRAINAGE ABSCESS ANAL     IRRIGATION AND DEBRIDEMENT ABSCESS N/A 04/15/2017   Procedure: DEBRIDEMENT SCROTAL WOUND AND DRESSING CHANGE;  Surgeon: Ceasar Mons, MD;  [facility];  Service: Urology;  Laterality: N/A;   IRRIGATION AND DEBRIDEMENT ABSCESS N/A 04/17/2017   Procedure: IRRIGATION AND DEBRIDEMENT ABSCESS;  Surgeon: Ceasar Mons, MD;  [facility];  Service: Urology;  Laterality: N/A;  RM 3   LAPAROSCOPIC GASTRIC BANDING  2006   has been removed   OSTECTOMY Right 04/08/2017   Procedure: OSTECTOMY RIGHT GREAT TOE;  Surgeon: Caprice Beaver, DPM;  Location: [facility];  Service: Podiatry;  Laterality: Right;   PORT-A-CATH REMOVAL Left 12/07/2012   Procedure: REMOVAL PORT-A-CATH;  Surgeon: Scherry Ran, MD;  Location: [facility];  Service: General;  Laterality: Left;   PORT-A-CATH REMOVAL N/A 10/02/2017   Procedure: MINOR REMOVAL PORT-A-CATH AT BEDSIDE;  Surgeon: Donnie Mesa, MD;  Location: [place];  Service: General;  Laterality: N/A;   PORTACATH PLACEMENT  07/07/2011   Procedure: INSERTION PORT-A-CATH;  Surgeon: Scherry Ran, MD;  [facility]: [facility];  Service: General;  Laterality: N/A;   PORTACATH PLACEMENT N/A 12/07/2012   Procedure: INSERTION PORT-A-CATH;  Surgeon: Scherry Ran, MD;  [facility]: [facility];  Service: General;  Laterality: N/A;  Attempted portacath placement on left and right side   URETEROSCOPY Right 12/16/2017   Procedure: DIAGNOSTIC RIGHT URETEROSCOPY;  Surgeon: [place] Gustin, MD;  Location: [facility];  Service: Urology;  Laterality: Right;   WOUND DEBRIDEMENT Right 04/08/2017   Procedure: EXCISION ULCERATION RIGHT GREAT TOE;  Surgeon: Caprice Beaver, DPM;  Location: [facility];  Service: Podiatry;  Laterality: Right;   WRIST SURGERY     Left; removal of bone fragment    There were no vitals filed for this visit.    Subjective Assessment - 04/24/21 0909      Subjective PT arrived wiht dressings intact, no report of pain currently.  Reports he's had compression socks for a year now, did place order at [facility] but top band not thick and increased discomfort, wonders if they have thick band on top.                       Wound Therapy - 04/24/21 0001     Subjective PT arrived wiht dressings intact, no report of pain currently.  Reports he's had compression socks for a year now, did place order at [facility] but top band not thick and increased discomfort, wonders if they have thick band on top.    Patient and Family Stated Goals wound to heal    Date of Onset 02/12/21    Prior Treatments compression, neosporin and other topicals    Pain Scale 0-10    Pain Score 0-No pain    Evaluation  and Treatment Procedures Explained to Patient/Family Yes    Evaluation and Treatment Procedures agreed to    Wound Properties Date First Assessed: 03/14/21 Time First Assessed: 0745 Wound Type: Skin tear Location: Pretibial Location Orientation: Distal;Right;Lateral Wound Description (Comments): lateral shin wound Present on Admission: Yes   Wound Image Images linked: 1    Dressing Type Impregnated gauze (bismuth);Compression wrap    Dressing Changed Changed    Dressing Status Old drainage    Dressing Change Frequency PRN    Site / Wound Assessment Clean;Dry;Granulation tissue    % Wound base Red or Granulating 100%   following debridement   Peri-wound Assessment Edema    Wound Length (cm) 1 cm    Wound Width (cm) 0.6 cm    Wound Depth (cm) 0.2 cm    Wound Volume (cm^3) 0.12 cm^3    Wound Surface Area (cm^2) 0.6 cm^2    Drainage Amount Minimal    Drainage Description Serosanguineous    Treatment Cleansed;Debridement (Selective)    Selective Debridement - Location wound edges, wound bed    Selective Debridement - Tools Used Forceps    Selective Debridement - Tissue Removed devitalized tissue    Wound Therapy - Clinical Statement Wound progressing well with  increased approximation from 6-11 o'clock.  Continued wiht xeroform and profore for edema control.  Measurements complete and given handout for [employer] pt interested in calling to ask if they have thicker band on top.  Encouraged to purchase new pair every 6 months.    Wound Therapy - Functional Problem List walking    Factors Delaying/Impairing Wound Healing Diabetes Mellitus;Multiple medical problems    Hydrotherapy Plan Debridement;Dressing change;Patient/family education;Pulsatile lavage with suction    Wound Therapy - Frequency 2X / week    Wound Therapy - Current Recommendations PT    Wound Plan dressing change, F/U wiht culture    Dressing  vaseline, xeroform 4x4 and profore dressing .                       PT Short Term Goals - 04/16/21 1525       PT SHORT TERM GOAL #1   Title Patient wound sizes to decrease 50% to decrease risk of infection.    Baseline 04/16/21:  1 wound remains, increased in size with culture complete this session    Status Partially Met      PT SHORT TERM GOAL #2   Title Patient to experience pain no more than 1/10 in wound area in order to show general improvement of condition     Status Achieved               PT Long Term Goals - 04/16/21 1526       PT LONG TERM GOAL #1   Title Patient to demonstrate full wound healing in order to show resolution of condition     Status On-going      PT LONG TERM GOAL #2   Title Patient and family to be independent in skin care strategies and edema control in order to assist in prevention of other wound formation     Baseline 11/15: Pt verbalized need for return to compression garment once wound is healed    Status Partially Met                    Patient will benefit from skilled therapeutic intervention in order to improve the following deficits and impairments:  Visit Diagnosis: Difficulty in walking, not elsewhere classified  Wound of right leg, subsequent  encounter     Problem List Patient Active Problem List   Diagnosis Date Noted   Multiple myeloma in remission ([place]) 11/10/2017   CKD (chronic kidney disease) stage 3, GFR 30-59 ml/min (HCC) 41/59/3012   Chronic systolic CHF (congestive heart failure) ([place]) 11/10/2017   MRSA bacteremia 10/03/2017   Pressure injury of skin 08/04/2017   Fournier's gangrene of scrotum 04/14/2017   Hypogammaglobulinemia ([place]) 03/09/2016   Diaphragmatic hernia without obstruction 37/99/0940   Chronic systolic heart failure ([place]) 12/19/2013   Anemia, normocytic normochromic 10/09/2012   Chronic pancreatitis ([place]) 10/07/2012   Atrial flutter ([place]) 09/17/2012   DDD (degenerative disc disease), cervical 03/18/2012   Arteriosclerotic cardiovascular disease (ASCVD)    Hypertension    Hyperlipidemia    Multiple myeloma ([place]) 07/01/2011   Morbid obesity ([place]) 04/29/2010   Insulin dependent diabetes mellitus 11/15/2008   OSA on CPAP 11/15/2008   Ihor Austin, LPTA/CLT; CBIS [phone]  Aldona Lento, PTA 04/24/2021, 9:16 AM  [place] [address] [place], [place], [address] Phone: [phone]   Fax:  [phone]  Name: [name] MRN: [id number] Date of Birth: 01/13/1961

## 2021-04-30 ENCOUNTER — Other Ambulatory Visit: Payer: Self-pay

## 2021-04-30 ENCOUNTER — Ambulatory Visit (HOSPITAL_COMMUNITY): Payer: BC Managed Care – PPO | Admitting: Physical Therapy

## 2021-04-30 ENCOUNTER — Encounter (HOSPITAL_COMMUNITY): Payer: Self-pay | Admitting: Physical Therapy

## 2021-04-30 DIAGNOSIS — R262 Difficulty in walking, not elsewhere classified: Secondary | ICD-10-CM

## 2021-04-30 DIAGNOSIS — E1142 Type 2 diabetes mellitus with diabetic polyneuropathy: Secondary | ICD-10-CM | POA: Diagnosis not present

## 2021-04-30 DIAGNOSIS — S81801D Unspecified open wound, right lower leg, subsequent encounter: Secondary | ICD-10-CM

## 2021-04-30 NOTE — Therapy (Signed)
[place], [place], [address] Phone: [phone]   Fax:  [phone]  Wound Care Therapy  Patient Details  Name: [name] MRN: [id number] Date of Birth: [birth year] Referring Provider (PT): [name] MD   Encounter Date: 04/30/2021   PT End of Session - 04/30/21 1346     Visit Number 14    Number of Visits 16    Date for PT Re-Evaluation 05/06/21    Authorization Type BCBS (no auth, no 30VL)    Progress Note Due on Visit 18    PT Start Time 1130    PT Stop Time 1200    PT Time Calculation (min) 30 min    Activity Tolerance Patient tolerated treatment well    Behavior During Therapy [facility] for tasks assessed/performed             Past Medical History:  Diagnosis Date   Abscess 04/2017   Scrotal   Anemia    Arteriosclerotic cardiovascular disease (ASCVD)    MI-2000s; stent to the proximal LAD and diagonal in 2001; stress nuclear in 2008-impaired exercise capacity, left ventricular dilatation, moderately to severely depressed EF, apical, inferior and anteroseptal scar   Arthritis    Atrial flutter (HCC)    Bence-Jones proteinuria 05/05/2011   Cellulitis of leg    both legs   Chronic diarrhea    Chronic kidney disease, stage 3, mod decreased GFR (HCC)    Creatinine of 1.84 in 06/2011 and 1.5 in 07/2011   Diabetes mellitus    Insulin   Dysrhythmia    AFlutter   GERD (gastroesophageal reflux disease)    Gout    Hyperlipidemia    Hypertension    Injection site reaction    Multiple myeloma 07/01/2011   Myocardial infarction ([place]) 2000   Obesity    Pedal edema    Venous insufficiency   Sleep apnea    uses cpap   Ulcer     Past Surgical History:  Procedure Laterality Date   ABSCESS DRAINAGE     Scrotal   BIOPSY  01/02/2012   Procedure: BIOPSY;  Surgeon: Rogene Houston, MD;  [facility];  Service: Endoscopy;  Laterality: N/A;   BONE MARROW BIOPSY  05/13/11   CARDIAC  CATHETERIZATION     cardiac stent   CARDIOVERSION N/A 10/13/2012   Procedure: CARDIOVERSION;  Surgeon: Yehuda Savannah, MD;  Location: [facility];  Service: Cardiovascular;  Laterality: N/A;   CATARACT EXTRACTION W/PHACO Left 02/13/2014   Procedure: CATARACT EXTRACTION PHACO AND INTRAOCULAR LENS PLACEMENT ([place]);  Surgeon: Tonny Branch, MD;  Location: [facility];  Service: Ophthalmology;  Laterality: Left;  CDE:  7.67   CATARACT EXTRACTION W/PHACO Right 03/02/2014   Procedure: CATARACT EXTRACTION PHACO AND INTRAOCULAR LENS PLACEMENT RIGHT EYE CDE=16.81;  Surgeon: Tonny Branch, MD;  Location: [facility];  Service: Ophthalmology;  Laterality: Right;   COLONOSCOPY  11/28/2011   Procedure: COLONOSCOPY;  Surgeon: Rogene Houston, MD;  [facility];  Service: Endoscopy;  Laterality: N/A;  [place] Right 12/16/2017   Procedure: CYSTOSCOPY WITH RIGHT RETROGRADE PYELOGRAM AND RIGHT URETERAL STENT REMOVAL;  Surgeon: Cleon Gustin, MD;  Location: [facility];  Service: Urology;  Laterality: Right;   CYSTOSCOPY W/ URETERAL STENT PLACEMENT Right 07/31/2017   Procedure: CYSTOSCOPY WITH RETROGRADE PYELOGRAM/URETERAL STENT PLACEMENT;  Surgeon: Cleon Gustin, MD;  [facility];  Service: Urology;  Laterality: Right;   ESOPHAGOGASTRODUODENOSCOPY  01/02/2012   Procedure: ESOPHAGOGASTRODUODENOSCOPY (EGD);  Surgeon: Rogene Houston, MD;  [facility];  Service: Endoscopy;  Laterality: N/A;  100   ESOPHAGOGASTRODUODENOSCOPY N/A 09/20/2012   Procedure: ESOPHAGOGASTRODUODENOSCOPY (EGD);  Surgeon: Rogene Houston, MD;  [facility];  Service: Endoscopy;  Laterality: N/A;   EUS N/A 10/07/2012   Procedure: UPPER ENDOSCOPIC ULTRASOUND (EUS) LINEAR;  Surgeon: Milus Banister, MD;  [facility];  Service: Endoscopy;  Laterality: N/A;   INCISION AND DRAINAGE ABSCESS N/A 04/14/2017   Procedure: INCISION AND DRAINAGE ABSCESS;  Surgeon: Ceasar Mons, MD;  [facility];  Service: Urology;  Laterality: N/A;   INCISION AND DRAINAGE ABSCESS ANAL     IRRIGATION AND DEBRIDEMENT ABSCESS N/A 04/15/2017   Procedure: DEBRIDEMENT SCROTAL WOUND AND DRESSING CHANGE;  Surgeon: Ceasar Mons, MD;  [facility];  Service: Urology;  Laterality: N/A;   IRRIGATION AND DEBRIDEMENT ABSCESS N/A 04/17/2017   Procedure: IRRIGATION AND DEBRIDEMENT ABSCESS;  Surgeon: Ceasar Mons, MD;  [facility];  Service: Urology;  Laterality: N/A;  RM 3   LAPAROSCOPIC GASTRIC BANDING  2006   has been removed   OSTECTOMY Right 04/08/2017   Procedure: OSTECTOMY RIGHT GREAT TOE;  Surgeon: Caprice Beaver, DPM;  Location: [facility];  Service: Podiatry;  Laterality: Right;   PORT-A-CATH REMOVAL Left 12/07/2012   Procedure: REMOVAL PORT-A-CATH;  Surgeon: Scherry Ran, MD;  Location: [facility];  Service: General;  Laterality: Left;   PORT-A-CATH REMOVAL N/A 10/02/2017   Procedure: MINOR REMOVAL PORT-A-CATH AT BEDSIDE;  Surgeon: Donnie Mesa, MD;  Location: [place];  Service: General;  Laterality: N/A;   PORTACATH PLACEMENT  07/07/2011   Procedure: INSERTION PORT-A-CATH;  Surgeon: Scherry Ran, MD;  Location: [facility];  Service: General;  Laterality: N/A;   PORTACATH PLACEMENT N/A 12/07/2012   Procedure: INSERTION PORT-A-CATH;  Surgeon: Scherry Ran, MD;  Location: [facility];  Service: General;  Laterality: N/A;  Attempted portacath placement on left and right side   URETEROSCOPY Right 12/16/2017   Procedure: DIAGNOSTIC RIGHT URETEROSCOPY;  Surgeon: Cle[place] Gustin, MD;  Location: [facility];  Service: Urology;  Laterality: Right;   WOUND DEBRIDEMENT Right 04/08/2017   Procedure: EXCISION ULCERATION RIGHT GREAT TOE;  Surgeon: Caprice Beaver, DPM;  Location: [facility];  Service: Podiatry;  Laterality: Right;   WRIST SURGERY     Left; removal of bone fragment    There were no vitals filed for this visit.               Wound Therapy - 04/30/21 0001      Subjective Pt states that he has no pain    Patient and Family Stated Goals wound to heal    Date of Onset 02/12/21    Prior Treatments compression, neosporin and other topicals    Pain Scale 0-10    Pain Score 0-No pain    Evaluation and Treatment Procedures Explained to Patient/Family Yes    Evaluation and Treatment Procedures agreed to    Wound Properties Date First Assessed: 03/14/21 Time First Assessed: 0745 Wound Type: Skin tear Location: Pretibial Location Orientation: Distal;Right;Lateral Wound Description (Comments): lateral shin wound Present on Admission: Yes   Dressing Type Compression wrap;Impregnated gauze (bismuth)    Dressing Changed Changed    Dressing Status New drainage    Dressing Change Frequency PRN    Site / Wound Assessment Clean;Dry;Granulation tissue    % Wound base Red or Granulating 100%  Wound Length (cm) 1 cm    Wound Width (cm) 0.7 cm    Wound Depth (cm) 0.2 cm    Wound Volume (cm^3) 0.14 cm^3    Wound Surface Area (cm^2) 0.7 cm^2    Drainage Amount Scant    Drainage Description Serous    Treatment Cleansed;Other (Comment)   retro massage   Selective Debridement - Location none needed    Wound Therapy - Clinical Statement No significant change of wound since last visit.   Therapist reviewed labs and noted rare pseudomonas aerugnosa, pt called MD to question whether he should be on antibiotic while in the departement.  MD was going to get back with the pt.    Wound Therapy - Functional Problem List walking    Factors Delaying/Impairing Wound Healing Diabetes Mellitus;Multiple medical problems    Hydrotherapy Plan Debridement;Dressing change;Patient/family education;Pulsatile lavage with suction    Wound Therapy - Frequency 2X / week    Wound Therapy - Current Recommendations PT    Wound Plan dressing change, F/U wiht culture    Dressing  honey, 2x2 and profore compression garment                       PT Short Term Goals - 04/16/21  1525       PT SHORT TERM GOAL #1   Title Patient wound sizes to decrease 50% to decrease risk of infection.    Baseline 04/16/21:  1 wound remains, increased in size with culture complete this session    Status Partially Met      PT SHORT TERM GOAL #2   Title Patient to experience pain no more than 1/10 in wound area in order to show general improvement of condition     Status Achieved               PT Long Term Goals - 04/16/21 1526       PT LONG TERM GOAL #1   Title Patient to demonstrate full wound healing in order to show resolution of condition     Status On-going      PT LONG TERM GOAL #2   Title Patient and family to be independent in skin care strategies and edema control in order to assist in prevention of other wound formation     Baseline 11/15: Pt verbalized need for return to compression garment once wound is healed    Status Partially Met                   Plan - 04/30/21 1346     Clinical Impression Statement see above    Personal Factors and Comorbidities Comorbidity 3+    Comorbidities CKD, HTN, HLD, hx MI, CVD, DM, lymphedema    Examination-Activity Limitations Bathing;Locomotion Level;Transfers;Stand    Examination-Participation Restrictions Occupation;Community Activity    Stability/Clinical Decision Making Stable/Uncomplicated    Rehab Potential Good    PT Frequency 2x / week    PT Duration 4 weeks    PT Treatment/Interventions ADLs/Self Care Home Management;Cryotherapy;Electrical Stimulation;Moist Heat;Gait training;Stair training;Functional mobility training;Therapeutic activities;Therapeutic exercise;Balance training;Neuromuscular re-education;Patient/family education;Manual lymph drainage;Manual techniques;Compression bandaging;Scar mobilization    PT Next Visit Plan change dressings and debride PRN to promote wound healing    Consulted and Agree with Plan of Care Patient             Patient will benefit from skilled therapeutic  intervention in order to improve the following deficits and impairments:  Abnormal gait, Difficulty  walking, Decreased skin integrity  Visit Diagnosis: Difficulty in walking, not elsewhere classified  Wound of right leg, subsequent encounter     Problem List Patient Active Problem List   Diagnosis Date Noted   Multiple myeloma in remission ([place]) 11/10/2017   CKD (chronic kidney disease) stage 3, GFR 30-59 ml/min (HCC) 75/17/0017   Chronic systolic CHF (congestive heart failure) ([place]) 11/10/2017   MRSA bacteremia 10/03/2017   Pressure injury of skin 08/04/2017   Fournier's gangrene of scrotum 04/14/2017   Hypogammaglobulinemia ([place]) 03/09/2016   Diaphragmatic hernia without obstruction 49/44/9675   Chronic systolic heart failure ([place]) 12/19/2013   Anemia, normocytic normochromic 10/09/2012   Chronic pancreatitis ([place]) 10/07/2012   Atrial flutter ([place]) 09/17/2012   DDD (degenerative disc disease), cervical 03/18/2012   Arteriosclerotic cardiovascular disease (ASCVD)    Hypertension    Hyperlipidemia    Multiple myeloma ([place]) 07/01/2011   Morbid obesity ([place]) 04/29/2010   Insulin dependent diabetes mellitus 11/15/2008   OSA on CPAP 11/15/2008   Rayetta Humphrey, PT CLT [phone]  04/30/2021, 1:47 PM  [place], [place], [address] Phone: [phone]   Fax:  [phone]  Name: [name] MRN: [id number] Date of Birth: 02/28/1961

## 2021-05-02 ENCOUNTER — Ambulatory Visit (HOSPITAL_COMMUNITY): Payer: BC Managed Care – PPO | Admitting: Physical Therapy

## 2021-05-03 ENCOUNTER — Ambulatory Visit (HOSPITAL_COMMUNITY): Payer: BC Managed Care – PPO | Attending: Family Medicine | Admitting: Physical Therapy

## 2021-05-03 ENCOUNTER — Other Ambulatory Visit: Payer: Self-pay

## 2021-05-03 ENCOUNTER — Encounter (HOSPITAL_COMMUNITY): Payer: Self-pay | Admitting: Physical Therapy

## 2021-05-03 DIAGNOSIS — S81801D Unspecified open wound, right lower leg, subsequent encounter: Secondary | ICD-10-CM | POA: Diagnosis present

## 2021-05-03 DIAGNOSIS — R262 Difficulty in walking, not elsewhere classified: Secondary | ICD-10-CM | POA: Diagnosis not present

## 2021-05-03 NOTE — Therapy (Signed)
[place], [place], [address] Phone: [phone]   Fax:  [phone]  Wound Care Therapy  Patient Details  Name: [name] MRN: [id number] Date of Birth: 1961-02-06 Referring Provider (PT): [name] MD   Encounter Date: 05/03/2021   PT End of Session - 05/03/21 1612     Visit Number 15    Number of Visits 16    Date for PT Re-Evaluation 05/06/21    Authorization Type BCBS (no auth, no 30VL)    Progress Note Due on Visit 18    PT Start Time 1540    PT Stop Time 1610    PT Time Calculation (min) 30 min    Activity Tolerance Patient tolerated treatment well    Behavior During Therapy [facility] for tasks assessed/performed             Past Medical History:  Diagnosis Date   Abscess 04/2017   Scrotal   Anemia    Arteriosclerotic cardiovascular disease (ASCVD)    MI-2000s; stent to the proximal LAD and diagonal in 2001; stress nuclear in 2008-impaired exercise capacity, left ventricular dilatation, moderately to severely depressed EF, apical, inferior and anteroseptal scar   Arthritis    Atrial flutter (HCC)    Bence-Jones proteinuria 05/05/2011   Cellulitis of leg    both legs   Chronic diarrhea    Chronic kidney disease, stage 3, mod decreased GFR (HCC)    Creatinine of 1.84 in 06/2011 and 1.5 in 07/2011   Diabetes mellitus    Insulin   Dysrhythmia    AFlutter   GERD (gastroesophageal reflux disease)    Gout    Hyperlipidemia    Hypertension    Injection site reaction    Multiple myeloma 07/01/2011   Myocardial infarction ([place]) 2000   Obesity    Pedal edema    Venous insufficiency   Sleep apnea    uses cpap   Ulcer     Past Surgical History:  Procedure Laterality Date   ABSCESS DRAINAGE     Scrotal   BIOPSY  01/02/2012   Procedure: BIOPSY;  Surgeon: Rogene Houston, MD;  [facility];  Service: Endoscopy;  Laterality: N/A;   BONE MARROW BIOPSY  05/13/11   CARDIAC  CATHETERIZATION     cardiac stent   CARDIOVERSION N/A 10/13/2012   Procedure: CARDIOVERSION;  Surgeon: Yehuda Savannah, MD;  Location: [facility];  Service: Cardiovascular;  Laterality: N/A;   CATARACT EXTRACTION W/PHACO Left 02/13/2014   Procedure: CATARACT EXTRACTION PHACO AND INTRAOCULAR LENS PLACEMENT ([place]);  Surgeon: Tonny Branch, MD;  Location: [facility];  Service: Ophthalmology;  Laterality: Left;  CDE:  7.67   CATARACT EXTRACTION W/PHACO Right 03/02/2014   Procedure: CATARACT EXTRACTION PHACO AND INTRAOCULAR LENS PLACEMENT RIGHT EYE CDE=16.81;  Surgeon: Tonny Branch, MD;  Location: [facility];  Service: Ophthalmology;  Laterality: Right;   COLONOSCOPY  11/28/2011   Procedure: COLONOSCOPY;  Surgeon: Rogene Houston, MD;  [facility];  Service: Endoscopy;  Laterality: N/A;  [place] Right 12/16/2017   Procedure: CYSTOSCOPY WITH RIGHT RETROGRADE PYELOGRAM AND RIGHT URETERAL STENT REMOVAL;  Surgeon: Cleon Gustin, MD;  Location: [facility];  Service: Urology;  Laterality: Right;   CYSTOSCOPY W/ URETERAL STENT PLACEMENT Right 07/31/2017   Procedure: CYSTOSCOPY WITH RETROGRADE PYELOGRAM/URETERAL STENT PLACEMENT;  Surgeon: Cleon Gustin, MD;  [facility];  Service: Urology;  Laterality: Right;   ESOPHAGOGASTRODUODENOSCOPY  01/02/2012   Procedure: ESOPHAGOGASTRODUODENOSCOPY (EGD);  Surgeon: Rogene Houston, MD;  [facility];  Service: Endoscopy;  Laterality: N/A;  100   ESOPHAGOGASTRODUODENOSCOPY N/A 09/20/2012   Procedure: ESOPHAGOGASTRODUODENOSCOPY (EGD);  Surgeon: Rogene Houston, MD;  [facility];  Service: Endoscopy;  Laterality: N/A;   EUS N/A 10/07/2012   Procedure: UPPER ENDOSCOPIC ULTRASOUND (EUS) LINEAR;  Surgeon: Milus Banister, MD;  [facility];  Service: Endoscopy;  Laterality: N/A;   INCISION AND DRAINAGE ABSCESS N/A 04/14/2017   Procedure: INCISION AND DRAINAGE ABSCESS;  Surgeon: Ceasar Mons, MD;  [facility]  [facility];  Service: Urology;  Laterality: N/A;   INCISION AND DRAINAGE ABSCESS ANAL     IRRIGATION AND DEBRIDEMENT ABSCESS N/A 04/15/2017   Procedure: DEBRIDEMENT SCROTAL WOUND AND DRESSING CHANGE;  Surgeon: Ceasar Mons, MD;  [facility];  Service: Urology;  Laterality: N/A;   IRRIGATION AND DEBRIDEMENT ABSCESS N/A 04/17/2017   Procedure: IRRIGATION AND DEBRIDEMENT ABSCESS;  Surgeon: Ceasar Mons, MD;  [facility];  Service: Urology;  Laterality: N/A;  RM 3   LAPAROSCOPIC GASTRIC BANDING  2006   has been removed   OSTECTOMY Right 04/08/2017   Procedure: OSTECTOMY RIGHT GREAT TOE;  Surgeon: Caprice Beaver, DPM;  Location: [facility];  Service: Podiatry;  Laterality: Right;   PORT-A-CATH REMOVAL Left 12/07/2012   Procedure: REMOVAL PORT-A-CATH;  Surgeon: Scherry Ran, MD;  Location: [facility];  Service: General;  Laterality: Left;   PORT-A-CATH REMOVAL N/A 10/02/2017   Procedure: MINOR REMOVAL PORT-A-CATH AT BEDSIDE;  Surgeon: Donnie Mesa, MD;  Location: [place];  Service: General;  Laterality: N/A;   PORTACATH PLACEMENT  07/07/2011   Procedure: INSERTION PORT-A-CATH;  Surgeon: Scherry Ran, MD;  [facility]: [facility];  Service: General;  Laterality: N/A;   PORTACATH PLACEMENT N/A 12/07/2012   Procedure: INSERTION PORT-A-CATH;  Surgeon: Scherry Ran, MD;  Location: [facility];  Service: General;  Laterality: N/A;  Attempted portacath placement on left and right side   URETEROSCOPY Right 12/16/2017   Procedure: DIAGNOSTIC RIGHT URETEROSCOPY;  Surgeon: Cleon Gustin, MD;  Location: [facility];  Service: Urology;  Laterality: Right;   WOUND DEBRIDEMENT Right 04/08/2017   Procedure: EXCISION ULCERATION RIGHT GREAT TOE;  Surgeon: Caprice Beaver, DPM;  Location: [facility];  Service: Podiatry;  Laterality: Right;   WRIST SURGERY     Left; removal of bone fragment    There were no vitals filed for this visit.      [facility] PT Assessment - 05/03/21 0001        Assessment   Medical Diagnosis Open wound of R lower Leg    [name]                     Wound Therapy - 05/03/21 0001     Subjective Reports he started antibiotics on wednesday    Patient and Family Stated Goals wound to heal    Date of Onset 02/12/21    Prior Treatments compression, neosporin and other topicals    Evaluation and Treatment Procedures Explained to Patient/Family Yes    Evaluation and Treatment Procedures agreed to    Wound Properties Date First Assessed: 03/14/21 Time First Assessed: 0745 Wound Type: Skin tear Location: Pretibial Location Orientation: Distal;Right;Lateral Wound Description (Comments): lateral shin wound Present on Admission: Yes   Dressing Type Compression wrap   medihoney   Dressing Changed Changed    Dressing Status  Old drainage    Dressing Change Frequency PRN    Site / Wound Assessment Clean;Red;Granulation tissue    % Wound base Red or Granulating 100%   after debridement   Peri-wound Assessment Edema    Drainage Amount Scant    Drainage Description Serous    Treatment Cleansed;Debridement (Selective)    Selective Debridement - Location to wound bed and wound edges    Selective Debridement - Tools Used Forceps    Selective Debridement - Tissue Removed devitalized tissue    Wound Therapy - Clinical Statement Patient with reduced size in wound but with rounded superior edges, opened edges and removed devitalized tissue. Continued with medihoney and profore. Will continue wtih current POC.    Wound Therapy - Functional Problem List walking    Factors Delaying/Impairing Wound Healing Diabetes Mellitus;Multiple medical problems    Hydrotherapy Plan Debridement;Dressing change;Patient/family education;Pulsatile lavage with suction    Wound Therapy - Frequency 2X / week    Wound Therapy - Current Recommendations PT    Wound Plan dressing change, F/U wiht culture    Dressing  honey, 2x2 and profore compression  garment                       PT Short Term Goals - 04/16/21 1525       PT SHORT TERM GOAL #1   Title Patient wound sizes to decrease 50% to decrease risk of infection.    Baseline 04/16/21:  1 wound remains, increased in size with culture complete this session    Status Partially Met      PT SHORT TERM GOAL #2   Title Patient to experience pain no more than 1/10 in wound area in order to show general improvement of condition     Status Achieved               PT Long Term Goals - 04/16/21 1526       PT LONG TERM GOAL #1   Title Patient to demonstrate full wound healing in order to show resolution of condition     Status On-going      PT LONG TERM GOAL #2   Title Patient and family to be independent in skin care strategies and edema control in order to assist in prevention of other wound formation     Baseline 11/15: Pt verbalized need for return to compression garment once wound is healed    Status Partially Met                   Plan - 05/03/21 1612     Clinical Impression Statement see above    Personal Factors and Comorbidities Comorbidity 3+    Comorbidities CKD, HTN, HLD, hx MI, CVD, DM, lymphedema    Examination-Activity Limitations Bathing;Locomotion Level;Transfers;Stand    Examination-Participation Restrictions Occupation;Community Activity    Stability/Clinical Decision Making Stable/Uncomplicated    Rehab Potential Good    PT Frequency 2x / week    PT Duration 4 weeks    PT Treatment/Interventions ADLs/Self Care Home Management;Cryotherapy;Electrical Stimulation;Moist Heat;Gait training;Stair training;Functional mobility training;Therapeutic activities;Therapeutic exercise;Balance training;Neuromuscular re-education;Patient/family education;Manual lymph drainage;Manual techniques;Compression bandaging;Scar mobilization    PT Next Visit Plan change dressings and debride PRN to promote wound healing    Consulted and Agree with Plan of  Care Patient             Patient will benefit from skilled therapeutic intervention in order to improve the following deficits and impairments:  Abnormal gait, Difficulty walking, Decreased skin integrity  Visit Diagnosis: Difficulty in walking, not elsewhere classified  Wound of right leg, subsequent encounter     Problem List Patient Active Problem List   Diagnosis Date Noted   Multiple myeloma in remission ([place]) 11/10/2017   CKD (chronic kidney disease) stage 3, GFR 30-59 ml/min (HCC) 62/19/4712   Chronic systolic CHF (congestive heart failure) ([place]) 11/10/2017   MRSA bacteremia 10/03/2017   Pressure injury of skin 08/04/2017   Fournier's gangrene of scrotum 04/14/2017   Hypogammaglobulinemia ([place]) 03/09/2016   Diaphragmatic hernia without obstruction 52/71/2929   Chronic systolic heart failure ([place]) 12/19/2013   Anemia, normocytic normochromic 10/09/2012   Chronic pancreatitis ([place]) 10/07/2012   Atrial flutter ([place]) 09/17/2012   DDD (degenerative disc disease), cervical 03/18/2012   Arteriosclerotic cardiovascular disease (ASCVD)    Hypertension    Hyperlipidemia    Multiple myeloma ([place]) 07/01/2011   Morbid obesity ([place]) 04/29/2010   Insulin dependent diabetes mellitus 11/15/2008   OSA on CPAP 11/15/2008    4:15 PM, 05/03/21 Jerene Pitch, DPT Physical Therapy with [facility]  [phone] office   [place], [place], [address] Phone: [phone]   Fax:  [phone]  Name: ILIAN WESSELL MRN: [id number] Date of Birth: 31-Aug-1960

## 2021-05-06 ENCOUNTER — Ambulatory Visit (HOSPITAL_COMMUNITY): Payer: BC Managed Care – PPO | Admitting: Physical Therapy

## 2021-05-06 ENCOUNTER — Other Ambulatory Visit: Payer: Self-pay

## 2021-05-06 DIAGNOSIS — R262 Difficulty in walking, not elsewhere classified: Secondary | ICD-10-CM

## 2021-05-06 DIAGNOSIS — S81801D Unspecified open wound, right lower leg, subsequent encounter: Secondary | ICD-10-CM

## 2021-05-06 NOTE — Therapy (Signed)
[facility] [address] [place], [place], [address] Phone: [phone]   Fax:  [phone]  Physical Therapy Treatment Progress Note Reporting Period 04/11/2021 to 05/06/2021  See note below for Objective Data and Assessment of Progress/Goals.     Patient Details  Name: [name] MRN: [id number] Date of Birth: [birth year] Referring Provider (PT): [name] MD   Encounter Date: 05/06/2021   PT End of Session - 05/06/21 1451     Visit Number 16    Number of Visits 26    Date for PT Re-Evaluation 06/17/21    Authorization Type BCBS (no auth, no 30VL)    Progress Note Due on Visit 26    PT Start Time 1315    PT Stop Time 1400    PT Time Calculation (min) 45 min    Activity Tolerance Patient tolerated treatment well    Behavior During Therapy [facility] for tasks assessed/performed             Past Medical History:  Diagnosis Date   Abscess 04/2017   Scrotal   Anemia    Arteriosclerotic cardiovascular disease (ASCVD)    MI-2000s; stent to the proximal LAD and diagonal in 2001; stress nuclear in 2008-impaired exercise capacity, left ventricular dilatation, moderately to severely depressed EF, apical, inferior and anteroseptal scar   Arthritis    Atrial flutter (HCC)    Bence-Jones proteinuria 05/05/2011   Cellulitis of leg    both legs   Chronic diarrhea    Chronic kidney disease, stage 3, mod decreased GFR (HCC)    Creatinine of 1.84 in 06/2011 and 1.5 in 07/2011   Diabetes mellitus    Insulin   Dysrhythmia    AFlutter   GERD (gastroesophageal reflux disease)    Gout    Hyperlipidemia    Hypertension    Injection site reaction    Multiple myeloma 07/01/2011   Myocardial infarction ([place]) 2000   Obesity    Pedal edema    Venous insufficiency   Sleep apnea    uses cpap   Ulcer     Past Surgical History:  Procedure Laterality Date   ABSCESS DRAINAGE     Scrotal   BIOPSY  01/02/2012   Procedure: BIOPSY;   Surgeon: Rogene Houston, MD;  [facility];  Service: Endoscopy;  Laterality: N/A;   BONE MARROW BIOPSY  05/13/11   CARDIAC CATHETERIZATION     cardiac stent   CARDIOVERSION N/A 10/13/2012   Procedure: CARDIOVERSION;  Surgeon: Yehuda Savannah, MD;  Location: [facility];  Service: Cardiovascular;  Laterality: N/A;   CATARACT EXTRACTION W/PHACO Left 02/13/2014   Procedure: CATARACT EXTRACTION PHACO AND INTRAOCULAR LENS PLACEMENT ([place]);  Surgeon: Tonny Branch, MD;  Location: [facility];  Service: Ophthalmology;  Laterality: Left;  CDE:  7.67   CATARACT EXTRACTION W/PHACO Right 03/02/2014   Procedure: CATARACT EXTRACTION PHACO AND INTRAOCULAR LENS PLACEMENT RIGHT EYE CDE=16.81;  Surgeon: Tonny Branch, MD;  Location: [facility];  Service: Ophthalmology;  Laterality: Right;   COLONOSCOPY  11/28/2011   Procedure: COLONOSCOPY;  Surgeon: Rogene Houston, MD;  [facility];  Service: Endoscopy;  Laterality: N/A;  [place] Right 12/16/2017   Procedure: CYSTOSCOPY WITH RIGHT RETROGRADE PYELOGRAM AND RIGHT URETERAL STENT REMOVAL;  Surgeon: Cleon Gustin, MD;  Location: [facility];  Service: Urology;  Laterality: Right;   CYSTOSCOPY W/ URETERAL STENT PLACEMENT Right 07/31/2017   Procedure: CYSTOSCOPY WITH RETROGRADE PYELOGRAM/URETERAL  STENT PLACEMENT;  Surgeon: Cleon Gustin, MD;  [facility];  Service: Urology;  Laterality: Right;   ESOPHAGOGASTRODUODENOSCOPY  01/02/2012   Procedure: ESOPHAGOGASTRODUODENOSCOPY (EGD);  Surgeon: Rogene Houston, MD;  [facility];  Service: Endoscopy;  Laterality: N/A;  100   ESOPHAGOGASTRODUODENOSCOPY N/A 09/20/2012   Procedure: ESOPHAGOGASTRODUODENOSCOPY (EGD);  Surgeon: Rogene Houston, MD;  [facility];  Service: Endoscopy;  Laterality: N/A;   EUS N/A 10/07/2012   Procedure: UPPER ENDOSCOPIC ULTRASOUND (EUS) LINEAR;  Surgeon: Milus Banister, MD;  [facility];  Service: Endoscopy;  Laterality: N/A;    INCISION AND DRAINAGE ABSCESS N/A 04/14/2017   Procedure: INCISION AND DRAINAGE ABSCESS;  Surgeon: Ceasar Mons, MD;  [facility];  Service: Urology;  Laterality: N/A;   INCISION AND DRAINAGE ABSCESS ANAL     IRRIGATION AND DEBRIDEMENT ABSCESS N/A 04/15/2017   Procedure: DEBRIDEMENT SCROTAL WOUND AND DRESSING CHANGE;  Surgeon: Ceasar Mons, MD;  [facility];  Service: Urology;  Laterality: N/A;   IRRIGATION AND DEBRIDEMENT ABSCESS N/A 04/17/2017   Procedure: IRRIGATION AND DEBRIDEMENT ABSCESS;  Surgeon: Ceasar Mons, MD;  [facility];  Service: Urology;  Laterality: N/A;  RM 3   LAPAROSCOPIC GASTRIC BANDING  2006   has been removed   OSTECTOMY Right 04/08/2017   Procedure: OSTECTOMY RIGHT GREAT TOE;  Surgeon: Caprice Beaver, DPM;  [facility]: [facility];  Service: Podiatry;  Laterality: Right;   PORT-A-CATH REMOVAL Left 12/07/2012   Procedure: REMOVAL PORT-A-CATH;  Surgeon: Scherry Ran, MD;  Location: [facility];  Service: General;  Laterality: Left;   PORT-A-CATH REMOVAL N/A 10/02/2017   Procedure: MINOR REMOVAL PORT-A-CATH AT BEDSIDE;  Surgeon: Donnie Mesa, MD;  Location: [place];  Service: General;  Laterality: N/A;   PORTACATH PLACEMENT  07/07/2011   Procedure: INSERTION PORT-A-CATH;  Surgeon: Scherry Ran, MD;  [facility]: [facility];  Service: General;  Laterality: N/A;   PORTACATH PLACEMENT N/A 12/07/2012   Procedure: INSERTION PORT-A-CATH;  Surgeon: Scherry Ran, MD;  Location: [facility];  Service: General;  Laterality: N/A;  Attempted portacath placement on left and right side   URETEROSCOPY Right 12/16/2017   Procedure: DIAGNOSTIC RIGHT URETEROSCOPY;  Surgeon: Cleon Gustin, MD;  Location: [facility];  Service: Urology;  Laterality: Right;   WOUND DEBRIDEMENT Right 04/08/2017   Procedure: EXCISION ULCERATION RIGHT GREAT TOE;  Surgeon: Caprice Beaver, DPM;  Location: [facility];  Service: Podiatry;  Laterality: Right;   WRIST SURGERY      Left; removal of bone fragment    There were no vitals filed for this visit.                    Wound Therapy - 05/06/21 1444     Subjective pt still taking antibiotics and states he is doing well today . Pt with questions regarding his compression garment.    Patient and Family Stated Goals wound to heal    Date of Onset 02/12/21    Prior Treatments compression, neosporin and other topicals    Evaluation and Treatment Procedures Explained to Patient/Family Yes    Evaluation and Treatment Procedures agreed to    Wound Properties Date First Assessed: 03/14/21 Time First Assessed: 0745 Wound Type: Skin tear Location: Pretibial Location Orientation: Distal;Right;Lateral Wound Description (Comments): lateral shin wound Present on Admission: Yes   Wound Image Images linked: 1    Dressing Type Compression wrap    Dressing Changed Changed  Dressing Status Old drainage    Dressing Change Frequency PRN    Site / Wound Assessment Clean;Red;Granulation tissue    % Wound base Red or Granulating 100%    Peri-wound Assessment Edema;Erythema (blanchable)    Wound Length (cm) 0.4 cm    Wound Width (cm) 0.3 cm    Wound Depth (cm) 0.2 cm    Wound Volume (cm^3) 0.02 cm^3    Wound Surface Area (cm^2) 0.12 cm^2    Drainage Amount Scant    Drainage Description Serous    Treatment Cleansed;Debridement (Selective)    Selective Debridement - Location to wound bed and wound edges    Selective Debridement - Tools Used Forceps    Selective Debridement - Tissue Removed devitalized tissue    Wound Therapy - Clinical Statement wound much smaller today.  3 small areas lateral to main wound with 100 percent granulation appear as areas where skin pealed away.  Largest in middle 0.5X0.8cm and 2 small on either side approx 0.3X0.2.  None of these areas with depth.  Edcuated on compression stockings and need to replace every 6 months.  Given information and measurements to order from [facility].  Pt had  ordered from here before but ordered the wrong stockings that were sheer and did not have the wider band on top with silicone.  Wrote all things down he should ask for  Pt also provided with clovers number if he was unable to acquire what he needed through [facility].  Wounds are overall improving.  Used xeroform this session.    Wound Therapy - Functional Problem List walking    Factors Delaying/Impairing Wound Healing Diabetes Mellitus;Multiple medical problems    Hydrotherapy Plan Debridement;Dressing change;Patient/family education;Pulsatile lavage with suction    Wound Therapy - Frequency 2X / week    Wound Therapy - Current Recommendations PT    Wound Plan Debridement, dressings as appropriate.  Weekly measurements and photographs.    Dressing  xeroform, 4X4, profore compression system                         PT Short Term Goals - 04/16/21 1525       PT SHORT TERM GOAL #1   Title Patient wound sizes to decrease 50% to decrease risk of infection.    Baseline 04/16/21:  1 wound remains, increased in size with culture complete this session    Status Partially Met      PT SHORT TERM GOAL #2   Title Patient to experience pain no more than 1/10 in wound area in order to show general improvement of condition     Status Achieved               PT Long Term Goals - 04/16/21 1526       PT LONG TERM GOAL #1   Title Patient to demonstrate full wound healing in order to show resolution of condition     Status On-going      PT LONG TERM GOAL #2   Title Patient and family to be independent in skin care strategies and edema control in order to assist in prevention of other wound formation     Baseline 11/15: Pt verbalized need for return to compression garment once wound is healed    Status Partially Met                    Patient will benefit from skilled therapeutic intervention in order to improve  the following deficits and impairments:     Visit  Diagnosis: No diagnosis found.     Problem List Patient Active Problem List   Diagnosis Date Noted   Multiple myeloma in remission ([place]) 11/10/2017   CKD (chronic kidney disease) stage 3, GFR 30-59 ml/min (HCC) 20/72/1828   Chronic systolic CHF (congestive heart failure) ([place]) 11/10/2017   MRSA bacteremia 10/03/2017   Pressure injury of skin 08/04/2017   Fournier's gangrene of scrotum 04/14/2017   Hypogammaglobulinemia ([place]) 03/09/2016   Diaphragmatic hernia without obstruction 83/37/4451   Chronic systolic heart failure ([place]) 12/19/2013   Anemia, normocytic normochromic 10/09/2012   Chronic pancreatitis ([place]) 10/07/2012   Atrial flutter ([place]) 09/17/2012   DDD (degenerative disc disease), cervical 03/18/2012   Arteriosclerotic cardiovascular disease (ASCVD)    Hypertension    Hyperlipidemia    Multiple myeloma ([place]) 07/01/2011   Morbid obesity ([place]) 04/29/2010   Insulin dependent diabetes mellitus 11/15/2008   OSA on CPAP 11/15/2008   Teena Irani, PTA/CLT, WTA [phone]  Teena Irani, PTA 05/06/2021, 3:08 PM  [facility] [address] [place], [place], [address] Phone: [phone]   Fax:  [phone]  Name: [name] MRN: [id number] Date of Birth: [birth year]

## 2021-05-06 NOTE — Addendum Note (Signed)
Addended by: Jerene Pitch R on: 05/06/2021 03:17 PM   Modules accepted: Orders

## 2021-05-07 ENCOUNTER — Encounter (HOSPITAL_COMMUNITY)
Admission: RE | Admit: 2021-05-07 | Discharge: 2021-05-07 | Disposition: A | Discharge status: Home / Self Care | Payer: BC Managed Care – PPO | Source: Ambulatory Visit | Attending: Nephrology | Admitting: Nephrology

## 2021-05-07 DIAGNOSIS — D631 Anemia in chronic kidney disease: Secondary | ICD-10-CM | POA: Diagnosis not present

## 2021-05-07 DIAGNOSIS — E1122 Type 2 diabetes mellitus with diabetic chronic kidney disease: Secondary | ICD-10-CM | POA: Diagnosis not present

## 2021-05-07 DIAGNOSIS — D638 Anemia in other chronic diseases classified elsewhere: Secondary | ICD-10-CM | POA: Diagnosis not present

## 2021-05-07 DIAGNOSIS — N184 Chronic kidney disease, stage 4 (severe): Secondary | ICD-10-CM | POA: Insufficient documentation

## 2021-05-07 DIAGNOSIS — N170 Acute kidney failure with tubular necrosis: Secondary | ICD-10-CM | POA: Diagnosis not present

## 2021-05-07 DIAGNOSIS — E1129 Type 2 diabetes mellitus with other diabetic kidney complication: Secondary | ICD-10-CM | POA: Diagnosis not present

## 2021-05-07 DIAGNOSIS — N189 Chronic kidney disease, unspecified: Secondary | ICD-10-CM | POA: Diagnosis not present

## 2021-05-07 LAB — POCT HEMOGLOBIN-HEMACUE: Hemoglobin: 9.5 g/dL — ABNORMAL LOW (ref 13.0–17.0)

## 2021-05-07 MED ORDER — EPOETIN ALFA-EPBX 10000 UNIT/ML IJ SOLN: 10000.0000 [IU] | Freq: Once | INTRAMUSCULAR | Status: AC

## 2021-05-07 MED ORDER — EPOETIN ALFA-EPBX 10000 UNIT/ML IJ SOLN
INTRAMUSCULAR | Status: AC
  Administered 2021-05-07: 10000 [IU] via SUBCUTANEOUS
  Filled 2021-05-07: qty 1

## 2021-05-08 DIAGNOSIS — R808 Other proteinuria: Secondary | ICD-10-CM | POA: Diagnosis not present

## 2021-05-08 DIAGNOSIS — D631 Anemia in chronic kidney disease: Secondary | ICD-10-CM | POA: Diagnosis not present

## 2021-05-08 DIAGNOSIS — I5022 Chronic systolic (congestive) heart failure: Secondary | ICD-10-CM | POA: Diagnosis not present

## 2021-05-08 DIAGNOSIS — N189 Chronic kidney disease, unspecified: Secondary | ICD-10-CM | POA: Diagnosis not present

## 2021-05-10 ENCOUNTER — Ambulatory Visit (HOSPITAL_COMMUNITY): Payer: BC Managed Care – PPO | Admitting: Physical Therapy

## 2021-05-10 ENCOUNTER — Other Ambulatory Visit: Payer: Self-pay

## 2021-05-10 DIAGNOSIS — S81801D Unspecified open wound, right lower leg, subsequent encounter: Secondary | ICD-10-CM

## 2021-05-10 DIAGNOSIS — R262 Difficulty in walking, not elsewhere classified: Secondary | ICD-10-CM

## 2021-05-10 NOTE — Therapy (Signed)
[place], [place], [address] Phone: [phone]   Fax:  [phone]  Wound Care Therapy  Patient Details  Name: [name] MRN: [id number] Date of Birth: 08-08-1960 Referring Provider (PT): [name] MD   Encounter Date: 05/10/2021   PT End of Session - 05/10/21 1002     Visit Number 17    Number of Visits 28    Date for PT Re-Evaluation 06/17/21    Authorization Type BCBS (no auth, no 30VL)    Progress Note Due on Visit 26    PT Start Time 0925    PT Stop Time 0955    PT Time Calculation (min) 30 min    Activity Tolerance Patient tolerated treatment well    Behavior During Therapy [facility] for tasks assessed/performed             Past Medical History:  Diagnosis Date   Abscess 04/2017   Scrotal   Anemia    Arteriosclerotic cardiovascular disease (ASCVD)    MI-2000s; stent to the proximal LAD and diagonal in 2001; stress nuclear in 2008-impaired exercise capacity, left ventricular dilatation, moderately to severely depressed EF, apical, inferior and anteroseptal scar   Arthritis    Atrial flutter (HCC)    Bence-Jones proteinuria 05/05/2011   Cellulitis of leg    both legs   Chronic diarrhea    Chronic kidney disease, stage 3, mod decreased GFR (HCC)    Creatinine of 1.84 in 06/2011 and 1.5 in 07/2011   Diabetes mellitus    Insulin   Dysrhythmia    AFlutter   GERD (gastroesophageal reflux disease)    Gout    Hyperlipidemia    Hypertension    Injection site reaction    Multiple myeloma 07/01/2011   Myocardial infarction ([place]) 2000   Obesity    Pedal edema    Venous insufficiency   Sleep apnea    uses cpap   Ulcer     Past Surgical History:  Procedure Laterality Date   ABSCESS DRAINAGE     Scrotal   BIOPSY  01/02/2012   Procedure: BIOPSY;  Surgeon: Rogene Houston, MD;  [facility];  Service: Endoscopy;  Laterality: N/A;   BONE MARROW BIOPSY  05/13/11   CARDIAC  CATHETERIZATION     cardiac stent   CARDIOVERSION N/A 10/13/2012   Procedure: CARDIOVERSION;  Surgeon: Yehuda Savannah, MD;  Location: [facility];  Service: Cardiovascular;  Laterality: N/A;   CATARACT EXTRACTION W/PHACO Left 02/13/2014   Procedure: CATARACT EXTRACTION PHACO AND INTRAOCULAR LENS PLACEMENT ([place]);  Surgeon: Tonny Branch, MD;  Location: [facility];  Service: Ophthalmology;  Laterality: Left;  CDE:  7.67   CATARACT EXTRACTION W/PHACO Right 03/02/2014   Procedure: CATARACT EXTRACTION PHACO AND INTRAOCULAR LENS PLACEMENT RIGHT EYE CDE=16.81;  Surgeon: Tonny Branch, MD;  Location: [facility];  Service: Ophthalmology;  Laterality: Right;   COLONOSCOPY  11/28/2011   Procedure: COLONOSCOPY;  Surgeon: Rogene Houston, MD;  [facility];  Service: Endoscopy;  Laterality: N/A;  [place] Right 12/16/2017   Procedure: CYSTOSCOPY WITH RIGHT RETROGRADE PYELOGRAM AND RIGHT URETERAL STENT REMOVAL;  Surgeon: Cleon Gustin, MD;  Location: [facility];  Service: Urology;  Laterality: Right;   CYSTOSCOPY W/ URETERAL STENT PLACEMENT Right 07/31/2017   Procedure: CYSTOSCOPY WITH RETROGRADE PYELOGRAM/URETERAL STENT PLACEMENT;  Surgeon: Cleon Gustin, MD;  [facility];  Service: Urology;  Laterality: Right;   ESOPHAGOGASTRODUODENOSCOPY  01/02/2012   Procedure: ESOPHAGOGASTRODUODENOSCOPY (EGD);  Surgeon: Rogene Houston, MD;  [facility];  Service: Endoscopy;  Laterality: N/A;  100   ESOPHAGOGASTRODUODENOSCOPY N/A 09/20/2012   Procedure: ESOPHAGOGASTRODUODENOSCOPY (EGD);  Surgeon: Rogene Houston, MD;  [facility];  Service: Endoscopy;  Laterality: N/A;   EUS N/A 10/07/2012   Procedure: UPPER ENDOSCOPIC ULTRASOUND (EUS) LINEAR;  Surgeon: Milus Banister, MD;  [facility];  Service: Endoscopy;  Laterality: N/A;   INCISION AND DRAINAGE ABSCESS N/A 04/14/2017   Procedure: INCISION AND DRAINAGE ABSCESS;  Surgeon: Ceasar Mons, MD;  [facility]  [facility];  Service: Urology;  Laterality: N/A;   INCISION AND DRAINAGE ABSCESS ANAL     IRRIGATION AND DEBRIDEMENT ABSCESS N/A 04/15/2017   Procedure: DEBRIDEMENT SCROTAL WOUND AND DRESSING CHANGE;  Surgeon: Ceasar Mons, MD;  [facility];  Service: Urology;  Laterality: N/A;   IRRIGATION AND DEBRIDEMENT ABSCESS N/A 04/17/2017   Procedure: IRRIGATION AND DEBRIDEMENT ABSCESS;  Surgeon: Ceasar Mons, MD;  [facility];  Service: Urology;  Laterality: N/A;  RM 3   LAPAROSCOPIC GASTRIC BANDING  2006   has been removed   OSTECTOMY Right 04/08/2017   Procedure: OSTECTOMY RIGHT GREAT TOE;  Surgeon: Caprice Beaver, DPM;  Location: [facility];  Service: Podiatry;  Laterality: Right;   PORT-A-CATH REMOVAL Left 12/07/2012   Procedure: REMOVAL PORT-A-CATH;  Surgeon: Scherry Ran, MD;  Location: [facility];  Service: General;  Laterality: Left;   PORT-A-CATH REMOVAL N/A 10/02/2017   Procedure: MINOR REMOVAL PORT-A-CATH AT BEDSIDE;  Surgeon: Donnie Mesa, MD;  Location: [facility];  Service: General;  Laterality: N/A;   PORTACATH PLACEMENT  07/07/2011   Procedure: INSERTION PORT-A-CATH;  Surgeon: Scherry Ran, MD;  Location: [facility];  Service: General;  Laterality: N/A;   PORTACATH PLACEMENT N/A 12/07/2012   Procedure: INSERTION PORT-A-CATH;  Surgeon: Scherry Ran, MD;  Location: [facility];  Service: General;  Laterality: N/A;  Attempted portacath placement on left and right side   URETEROSCOPY Right 12/16/2017   Procedure: DIAGNOSTIC RIGHT URETEROSCOPY;  Surgeon: Cleon Gustin, MD;  Location: [facility];  Service: Urology;  Laterality: Right;   WOUND DEBRIDEMENT Right 04/08/2017   Procedure: EXCISION ULCERATION RIGHT GREAT TOE;  Surgeon: Caprice Beaver, DPM;  Location: [facility];  Service: Podiatry;  Laterality: Right;   WRIST SURGERY     Left; removal of bone fragment    There were no vitals filed for this visit.               Wound Therapy - 05/10/21 0959      Subjective pt still taking antibiotics and states he is doing well today . Pt with questions regarding his compression garment.    Patient and Family Stated Goals wound to heal    Date of Onset 02/12/21    Prior Treatments compression, neosporin and other topicals    Evaluation and Treatment Procedures Explained to Patient/Family Yes    Evaluation and Treatment Procedures agreed to    Wound Properties Date First Assessed: 03/14/21 Time First Assessed: 0745 Wound Type: Skin tear Location: Pretibial Location Orientation: Distal;Right;Lateral Wound Description (Comments): lateral shin wound Present on Admission: Yes   Wound Image Images linked: 1    Dressing Type Impregnated gauze (bismuth);Compression wrap    Dressing Changed Changed    Dressing Status Old drainage    Dressing Change Frequency PRN    Site / Wound Assessment Clean;Granulation tissue    %  Wound base Red or Granulating 100%    Peri-wound Assessment Edema;Erythema (blanchable)    Wound Length (cm) 0.4 cm    Wound Width (cm) 0.3 cm    Wound Depth (cm) 0.2 cm    Wound Volume (cm^3) 0.02 cm^3    Wound Surface Area (cm^2) 0.12 cm^2    Drainage Amount Scant    Drainage Description Serous    Treatment Cleansed;Debridement (Selective)    Selective Debridement - Location to wound bed and wound edges    Selective Debridement - Tools Used Forceps    Selective Debridement - Tissue Removed devitalized tissue    Wound Therapy - Clinical Statement not much change from 4 days ago for primary wound, however other smaller wounds approximated and 95% healed at this point without drainage.  skin integrity much improved as well.  continued with cleansing and debridement of primary wound.  Moisturized well prior to rebandaging with profore system.    Wound Therapy - Functional Problem List walking    Factors Delaying/Impairing Wound Healing Diabetes Mellitus;Multiple medical problems    Hydrotherapy Plan Debridement;Dressing  change;Patient/family education;Pulsatile lavage with suction    Wound Therapy - Frequency 2X / week    Wound Therapy - Current Recommendations PT    Wound Plan Debridement, dressings as appropriate.  Weekly measurements and photographs.    Dressing  xeroform, 4X4, profore compression system                       PT Short Term Goals - 04/16/21 1525       PT SHORT TERM GOAL #1   Title Patient wound sizes to decrease 50% to decrease risk of infection.    Baseline 04/16/21:  1 wound remains, increased in size with culture complete this session    Status Partially Met      PT SHORT TERM GOAL #2   Title Patient to experience pain no more than 1/10 in wound area in order to show general improvement of condition     Status Achieved               PT Long Term Goals - 04/16/21 1526       PT LONG TERM GOAL #1   Title Patient to demonstrate full wound healing in order to show resolution of condition     Status On-going      PT LONG TERM GOAL #2   Title Patient and family to be independent in skin care strategies and edema control in order to assist in prevention of other wound formation     Baseline 11/15: Pt verbalized need for return to compression garment once wound is healed    Status Partially Met                    Patient will benefit from skilled therapeutic intervention in order to improve the following deficits and impairments:     Visit Diagnosis: Difficulty in walking, not elsewhere classified  Wound of right leg, subsequent encounter     Problem List Patient Active Problem List   Diagnosis Date Noted   Multiple myeloma in remission ([place]) 11/10/2017   CKD (chronic kidney disease) stage 3, GFR 30-59 ml/min ([place]) 95/28/4132   Chronic systolic CHF (congestive heart failure) ([place]) 11/10/2017   MRSA bacteremia 10/03/2017   Pressure injury of skin 08/04/2017   Fournier's gangrene of scrotum 04/14/2017   Hypogammaglobulinemia ([place])  03/09/2016   Diaphragmatic hernia without obstruction 02/26/2016   Chronic  systolic heart failure ([place]) 12/19/2013   Anemia, normocytic normochromic 10/09/2012   Chronic pancreatitis ([place]) 10/07/2012   Atrial flutter ([place]) 09/17/2012   DDD (degenerative disc disease), cervical 03/18/2012   Arteriosclerotic cardiovascular disease (ASCVD)    Hypertension    Hyperlipidemia    Multiple myeloma ([place]) 07/01/2011   Morbid obesity ([place]) 04/29/2010   Insulin dependent diabetes mellitus 11/15/2008   OSA on CPAP 11/15/2008   Teena Irani, PTA/CLT, WTA [phone]  Teena Irani, PTA 05/10/2021, 10:02 AM  [place], [place], [address] Phone: [phone]   Fax:  [phone]  Name: CHAYCE ROBBINS MRN: [id number] Date of Birth: 12-Dec-1960

## 2021-05-13 ENCOUNTER — Ambulatory Visit (HOSPITAL_COMMUNITY): Payer: BC Managed Care – PPO | Admitting: Physical Therapy

## 2021-05-13 ENCOUNTER — Other Ambulatory Visit: Payer: Self-pay

## 2021-05-13 ENCOUNTER — Encounter (HOSPITAL_COMMUNITY): Payer: Self-pay | Admitting: Physical Therapy

## 2021-05-13 DIAGNOSIS — R262 Difficulty in walking, not elsewhere classified: Secondary | ICD-10-CM

## 2021-05-13 DIAGNOSIS — S81801D Unspecified open wound, right lower leg, subsequent encounter: Secondary | ICD-10-CM

## 2021-05-13 NOTE — Addendum Note (Signed)
Addended by: Jerene Pitch R on: 05/13/2021 10:56 AM   Modules accepted: Orders

## 2021-05-13 NOTE — Therapy (Signed)
[place], [place], [address] Phone: [phone]   Fax:  [phone]  Wound Care Therapy  Patient Details  Name: [name] MRN: [id number] Date of Birth: August 28, 1960 Referring Provider (PT): [name] MD   Encounter Date: 05/13/2021   PT End of Session - 05/13/21 1649     Visit Number 18    Number of Visits 28    Date for PT Re-Evaluation 06/17/21    Authorization Type BCBS (no auth, no 30VL)    Progress Note Due on Visit 26    PT Start Time 1620    PT Stop Time 1650    PT Time Calculation (min) 30 min    Activity Tolerance Patient tolerated treatment well    Behavior During Therapy [facility] for tasks assessed/performed             Past Medical History:  Diagnosis Date   Abscess 04/2017   Scrotal   Anemia    Arteriosclerotic cardiovascular disease (ASCVD)    MI-2000s; stent to the proximal LAD and diagonal in 2001; stress nuclear in 2008-impaired exercise capacity, left ventricular dilatation, moderately to severely depressed EF, apical, inferior and anteroseptal scar   Arthritis    Atrial flutter (HCC)    Bence-Jones proteinuria 05/05/2011   Cellulitis of leg    both legs   Chronic diarrhea    Chronic kidney disease, stage 3, mod decreased GFR (HCC)    Creatinine of 1.84 in 06/2011 and 1.5 in 07/2011   Diabetes mellitus    Insulin   Dysrhythmia    AFlutter   GERD (gastroesophageal reflux disease)    Gout    Hyperlipidemia    Hypertension    Injection site reaction    Multiple myeloma 07/01/2011   Myocardial infarction ([place]) 2000   Obesity    Pedal edema    Venous insufficiency   Sleep apnea    uses cpap   Ulcer     Past Surgical History:  Procedure Laterality Date   ABSCESS DRAINAGE     Scrotal   BIOPSY  01/02/2012   Procedure: BIOPSY;  Surgeon: Rogene Houston, MD;  [facility];  Service: Endoscopy;  Laterality: N/A;   BONE MARROW BIOPSY  05/13/11   CARDIAC  CATHETERIZATION     cardiac stent   CARDIOVERSION N/A 10/13/2012   Procedure: CARDIOVERSION;  Surgeon: Yehuda Savannah, MD;  Location: [facility];  Service: Cardiovascular;  Laterality: N/A;   CATARACT EXTRACTION W/PHACO Left 02/13/2014   Procedure: CATARACT EXTRACTION PHACO AND INTRAOCULAR LENS PLACEMENT ([place]);  Surgeon: Tonny Branch, MD;  Location: [facility];  Service: Ophthalmology;  Laterality: Left;  CDE:  7.67   CATARACT EXTRACTION W/PHACO Right 03/02/2014   Procedure: CATARACT EXTRACTION PHACO AND INTRAOCULAR LENS PLACEMENT RIGHT EYE CDE=16.81;  Surgeon: Tonny Branch, MD;  Location: [facility];  Service: Ophthalmology;  Laterality: Right;   COLONOSCOPY  11/28/2011   Procedure: COLONOSCOPY;  Surgeon: Rogene Houston, MD;  [facility];  Service: Endoscopy;  Laterality: N/A;  [place] Right 12/16/2017   Procedure: CYSTOSCOPY WITH RIGHT RETROGRADE PYELOGRAM AND RIGHT URETERAL STENT REMOVAL;  Surgeon: Cleon Gustin, MD;  Location: [facility];  Service: Urology;  Laterality: Right;   CYSTOSCOPY W/ URETERAL STENT PLACEMENT Right 07/31/2017   Procedure: CYSTOSCOPY WITH RETROGRADE PYELOGRAM/URETERAL STENT PLACEMENT;  Surgeon: Cleon Gustin, MD;  [facility];  Service: Urology;  Laterality: Right;   ESOPHAGOGASTRODUODENOSCOPY  01/02/2012   Procedure: ESOPHAGOGASTRODUODENOSCOPY (EGD);  Surgeon: Rogene Houston, MD;  [facility];  Service: Endoscopy;  Laterality: N/A;  100   ESOPHAGOGASTRODUODENOSCOPY N/A 09/20/2012   Procedure: ESOPHAGOGASTRODUODENOSCOPY (EGD);  Surgeon: Rogene Houston, MD;  [facility];  Service: Endoscopy;  Laterality: N/A;   EUS N/A 10/07/2012   Procedure: UPPER ENDOSCOPIC ULTRASOUND (EUS) LINEAR;  Surgeon: Milus Banister, MD;  [facility];  Service: Endoscopy;  Laterality: N/A;   INCISION AND DRAINAGE ABSCESS N/A 04/14/2017   Procedure: INCISION AND DRAINAGE ABSCESS;  Surgeon: Ceasar Mons, MD;  [facility];  Service: Urology;  Laterality: N/A;   INCISION AND DRAINAGE ABSCESS ANAL     IRRIGATION AND DEBRIDEMENT ABSCESS N/A 04/15/2017   Procedure: DEBRIDEMENT SCROTAL WOUND AND DRESSING CHANGE;  Surgeon: Ceasar Mons, MD;  [facility];  Service: Urology;  Laterality: N/A;   IRRIGATION AND DEBRIDEMENT ABSCESS N/A 04/17/2017   Procedure: IRRIGATION AND DEBRIDEMENT ABSCESS;  Surgeon: Ceasar Mons, MD;  [facility];  Service: Urology;  Laterality: N/A;  RM 3   LAPAROSCOPIC GASTRIC BANDING  2006   has been removed   OSTECTOMY Right 04/08/2017   Procedure: OSTECTOMY RIGHT GREAT TOE;  Surgeon: Caprice Beaver, DPM;  Location: [facility];  Service: Podiatry;  Laterality: Right;   PORT-A-CATH REMOVAL Left 12/07/2012   Procedure: REMOVAL PORT-A-CATH;  Surgeon: Scherry Ran, MD;  Location: [facility];  Service: General;  Laterality: Left;   PORT-A-CATH REMOVAL N/A 10/02/2017   Procedure: MINOR REMOVAL PORT-A-CATH AT BEDSIDE;  Surgeon: Donnie Mesa, MD;  Location: [place];  Service: General;  Laterality: N/A;   PORTACATH PLACEMENT  07/07/2011   Procedure: INSERTION PORT-A-CATH;  Surgeon: Scherry Ran, MD;  Location: [facility];  Service: General;  Laterality: N/A;   PORTACATH PLACEMENT N/A 12/07/2012   Procedure: INSERTION PORT-A-CATH;  Surgeon: Scherry Ran, MD;  Location: [facility];  Service: General;  Laterality: N/A;  Attempted portacath placement on left and right side   URETEROSCOPY Right 12/16/2017   Procedure: DIAGNOSTIC RIGHT URETEROSCOPY;  Surgeon: Cleon Gustin, MD;  Location: [facility];  Service: Urology;  Laterality: Right;   WOUND DEBRIDEMENT Right 04/08/2017   Procedure: EXCISION ULCERATION RIGHT GREAT TOE;  Surgeon: Caprice Beaver, DPM;  Location: [facility];  Service: Podiatry;  Laterality: Right;   WRIST SURGERY     Left; removal of bone fragment    There were no vitals filed for this visit.      [facility] PT Assessment - 05/13/21 0001        Assessment   Medical Diagnosis Open wound of R lower Leg    [name]                     Wound Therapy - 05/13/21 0001     Subjective States leg is feeling better. REports he does not have compression garments as they want to wait until after his leg is healed (apothacary)    Patient and Family Stated Goals wound to heal    Date of Onset 02/12/21    Prior Treatments compression, neosporin and other topicals    Evaluation and Treatment Procedures Explained to Patient/Family Yes    Evaluation and Treatment Procedures agreed to    Wound Properties Date First Assessed: 03/14/21 Time First Assessed: 0745 Wound Type: Skin tear Location: Pretibial Location Orientation: Distal;Right;Lateral Wound Description (Comments): lateral shin wound Present on Admission: Yes  Dressing Type Impregnated gauze (bismuth)    Dressing Changed Changed    Dressing Status Old drainage    Dressing Change Frequency PRN    Site / Wound Assessment Granulation tissue;Dry    % Wound base Red or Granulating 100%   after debridement   Peri-wound Assessment Edema    Drainage Amount Scant    Drainage Description Serous    Treatment Cleansed;Debridement (Selective)    Selective Debridement - Location to wound bed and wound edges    Selective Debridement - Tools Used Forceps    Selective Debridement - Tissue Removed devitalized tissue    Wound Therapy - Clinical Statement Patient with wound slowly approximating. Increased edmea in right leg noted with multiple folds in legs where banadage was. Previous area that appeared over the last couple of sesisons is now completly healed. Reversed profore to cotton layer first to see if this helps with potential skin irritation and folds in leg. Changed to medihoney to see if body responds better to it. Will continue wtih current POC.    Wound Therapy - Functional Problem List walking    Factors Delaying/Impairing Wound Healing Diabetes  Mellitus;Multiple medical problems    Hydrotherapy Plan Debridement;Dressing change;Patient/family education;Pulsatile lavage with suction    Wound Therapy - Frequency 2X / week    Wound Therapy - Current Recommendations PT    Wound Plan Debridement, dressings as appropriate.  Weekly measurements and photographs.    Dressing  medihoney, 4X4, profore compression system                       PT Short Term Goals - 04/16/21 1525       PT SHORT TERM GOAL #1   Title Patient wound sizes to decrease 50% to decrease risk of infection.    Baseline 04/16/21:  1 wound remains, increased in size with culture complete this session    Status Partially Met      PT SHORT TERM GOAL #2   Title Patient to experience pain no more than 1/10 in wound area in order to show general improvement of condition     Status Achieved               PT Long Term Goals - 04/16/21 1526       PT LONG TERM GOAL #1   Title Patient to demonstrate full wound healing in order to show resolution of condition     Status On-going      PT LONG TERM GOAL #2   Title Patient and family to be independent in skin care strategies and edema control in order to assist in prevention of other wound formation     Baseline 11/15: Pt verbalized need for return to compression garment once wound is healed    Status Partially Met                   Plan - 05/13/21 1650     Clinical Impression Statement see above    Personal Factors and Comorbidities Comorbidity 3+    Comorbidities CKD, HTN, HLD, hx MI, CVD, DM, lymphedema    Examination-Activity Limitations Bathing;Locomotion Level;Transfers;Stand    Examination-Participation Restrictions Occupation;Community Activity    Stability/Clinical Decision Making Stable/Uncomplicated    Rehab Potential Good    PT Frequency 2x / week    PT Duration 4 weeks    PT Treatment/Interventions ADLs/Self Care Home Management;Cryotherapy;Electrical Stimulation;Moist  Heat;Gait training;Stair training;Functional mobility training;Therapeutic activities;Therapeutic exercise;Balance training;Neuromuscular re-education;Patient/family education;Manual lymph  drainage;Manual techniques;Compression bandaging;Scar mobilization    PT Next Visit Plan change dressings and debride PRN to promote wound healing    Consulted and Agree with Plan of Care Patient             Patient will benefit from skilled therapeutic intervention in order to improve the following deficits and impairments:  Abnormal gait, Difficulty walking, Decreased skin integrity  Visit Diagnosis: Difficulty in walking, not elsewhere classified  Wound of right leg, subsequent encounter     Problem List Patient Active Problem List   Diagnosis Date Noted   Multiple myeloma in remission ([place]) 11/10/2017   CKD (chronic kidney disease) stage 3, GFR 30-59 ml/min (HCC) 04/79/9872   Chronic systolic CHF (congestive heart failure) ([place]) 11/10/2017   MRSA bacteremia 10/03/2017   Pressure injury of skin 08/04/2017   Fournier's gangrene of scrotum 04/14/2017   Hypogammaglobulinemia ([place]) 03/09/2016   Diaphragmatic hernia without obstruction 15/87/2761   Chronic systolic heart failure ([place]) 12/19/2013   Anemia, normocytic normochromic 10/09/2012   Chronic pancreatitis ([place]) 10/07/2012   Atrial flutter ([place]) 09/17/2012   DDD (degenerative disc disease), cervical 03/18/2012   Arteriosclerotic cardiovascular disease (ASCVD)    Hypertension    Hyperlipidemia    Multiple myeloma ([place]) 07/01/2011   Morbid obesity ([place]) 04/29/2010   Insulin dependent diabetes mellitus 11/15/2008   OSA on CPAP 11/15/2008   4:54 PM, 05/13/21 Jerene Pitch, DPT Physical Therapy with [facility]  [phone] office   [place], [place], [address] Phone: [phone]   Fax:  [phone]  Name: [name] MRN:  [id number] Date of Birth: May 01, 1961

## 2021-05-15 ENCOUNTER — Ambulatory Visit (HOSPITAL_COMMUNITY): Payer: BC Managed Care – PPO

## 2021-05-16 DIAGNOSIS — R143 Flatulence: Secondary | ICD-10-CM | POA: Diagnosis not present

## 2021-05-16 DIAGNOSIS — R197 Diarrhea, unspecified: Secondary | ICD-10-CM | POA: Diagnosis not present

## 2021-05-17 ENCOUNTER — Other Ambulatory Visit: Payer: Self-pay

## 2021-05-17 ENCOUNTER — Ambulatory Visit (HOSPITAL_COMMUNITY): Payer: BC Managed Care – PPO | Admitting: Physical Therapy

## 2021-05-17 DIAGNOSIS — R262 Difficulty in walking, not elsewhere classified: Secondary | ICD-10-CM | POA: Diagnosis not present

## 2021-05-17 DIAGNOSIS — S81801D Unspecified open wound, right lower leg, subsequent encounter: Secondary | ICD-10-CM

## 2021-05-17 NOTE — Therapy (Signed)
[name], [place], [address] Phone: [phone]   Fax:  [phone]  Wound Care Therapy  Patient Details  Name: [name] MRN: [id number] Date of Birth: [birth year] Referring Provider (PT): [name] MD   Encounter Date: 05/17/2021   PT End of Session - 05/17/21 1658     Visit Number 19    Number of Visits 28    Date for PT Re-Evaluation 06/17/21    Authorization Type BCBS (no auth, no 30VL)    Progress Note Due on Visit 26    PT Start Time 1540    PT Stop Time 1605    PT Time Calculation (min) 25 min    Activity Tolerance Patient tolerated treatment well    Behavior During Therapy [facility] for tasks assessed/performed             Past Medical History:  Diagnosis Date   Abscess 04/2017   Scrotal   Anemia    Arteriosclerotic cardiovascular disease (ASCVD)    MI-2000s; stent to the proximal LAD and diagonal in 2001; stress nuclear in 2008-impaired exercise capacity, left ventricular dilatation, moderately to severely depressed EF, apical, inferior and anteroseptal scar   Arthritis    Atrial flutter (HCC)    Bence-Jones proteinuria 05/05/2011   Cellulitis of leg    both legs   Chronic diarrhea    Chronic kidney disease, stage 3, mod decreased GFR (HCC)    Creatinine of 1.84 in 06/2011 and 1.5 in 07/2011   Diabetes mellitus    Insulin   Dysrhythmia    AFlutter   GERD (gastroesophageal reflux disease)    Gout    Hyperlipidemia    Hypertension    Injection site reaction    Multiple myeloma 07/01/2011   Myocardial infarction ([place]) 2000   Obesity    Pedal edema    Venous insufficiency   Sleep apnea    uses cpap   Ulcer     Past Surgical History:  Procedure Laterality Date   ABSCESS DRAINAGE     Scrotal   BIOPSY  01/02/2012   Procedure: BIOPSY;  Surgeon: Rogene Houston, MD;  [facility];  Service: Endoscopy;  Laterality: N/A;   BONE MARROW BIOPSY  05/13/11   CARDIAC  CATHETERIZATION     cardiac stent   CARDIOVERSION N/A 10/13/2012   Procedure: CARDIOVERSION;  Surgeon: Yehuda Savannah, MD;  Location: [facility] ORS;  Service: Cardiovascular;  Laterality: N/A;   CATARACT EXTRACTION W/PHACO Left 02/13/2014   Procedure: CATARACT EXTRACTION PHACO AND INTRAOCULAR LENS PLACEMENT ([place]);  Surgeon: Tonny Branch, MD;  Location: [facility];  Service: Ophthalmology;  Laterality: Left;  CDE:  7.67   CATARACT EXTRACTION W/PHACO Right 03/02/2014   Procedure: CATARACT EXTRACTION PHACO AND INTRAOCULAR LENS PLACEMENT RIGHT EYE CDE=16.81;  Surgeon: Tonny Branch, MD;  Location: [facility];  Service: Ophthalmology;  Laterality: Right;   COLONOSCOPY  11/28/2011   Procedure: COLONOSCOPY;  Surgeon: Rogene Houston, MD;  [facility]: [facility];  Service: Endoscopy;  Laterality: N/A;  [place] Right 12/16/2017   Procedure: CYSTOSCOPY WITH RIGHT RETROGRADE PYELOGRAM AND RIGHT URETERAL STENT REMOVAL;  Surgeon: Cleon Gustin, MD;  Location: [facility];  Service: Urology;  Laterality: Right;   CYSTOSCOPY W/ URETERAL STENT PLACEMENT Right 07/31/2017   Procedure: CYSTOSCOPY WITH RETROGRADE PYELOGRAM/URETERAL STENT PLACEMENT;  Surgeon: Cleon Gustin, MD;  [facility];  Service: Urology;  Laterality: Right;   ESOPHAGOGASTRODUODENOSCOPY  01/02/2012   Procedure: ESOPHAGOGASTRODUODENOSCOPY (EGD);  Surgeon: Rogene Houston, MD;  [facility];  Service: Endoscopy;  Laterality: N/A;  100   ESOPHAGOGASTRODUODENOSCOPY N/A 09/20/2012   Procedure: ESOPHAGOGASTRODUODENOSCOPY (EGD);  Surgeon: Rogene Houston, MD;  [facility];  Service: Endoscopy;  Laterality: N/A;   EUS N/A 10/07/2012   Procedure: UPPER ENDOSCOPIC ULTRASOUND (EUS) LINEAR;  Surgeon: Milus Banister, MD;  [facility];  Service: Endoscopy;  Laterality: N/A;   INCISION AND DRAINAGE ABSCESS N/A 04/14/2017   Procedure: INCISION AND DRAINAGE ABSCESS;  Surgeon: Ceasar Mons, MD;  [facility]  [facility];  Service: Urology;  Laterality: N/A;   INCISION AND DRAINAGE ABSCESS ANAL     IRRIGATION AND DEBRIDEMENT ABSCESS N/A 04/15/2017   Procedure: DEBRIDEMENT SCROTAL WOUND AND DRESSING CHANGE;  Surgeon: Ceasar Mons, MD;  [facility];  Service: Urology;  Laterality: N/A;   IRRIGATION AND DEBRIDEMENT ABSCESS N/A 04/17/2017   Procedure: IRRIGATION AND DEBRIDEMENT ABSCESS;  Surgeon: Ceasar Mons, MD;  [facility];  Service: Urology;  Laterality: N/A;  RM 3   LAPAROSCOPIC GASTRIC BANDING  2006   has been removed   OSTECTOMY Right 04/08/2017   Procedure: OSTECTOMY RIGHT GREAT TOE;  Surgeon: Caprice Beaver, DPM;  Location: [facility];  Service: Podiatry;  Laterality: Right;   PORT-A-CATH REMOVAL Left 12/07/2012   Procedure: REMOVAL PORT-A-CATH;  Surgeon: Scherry Ran, MD;  Location: [facility];  Service: General;  Laterality: Left;   PORT-A-CATH REMOVAL N/A 10/02/2017   Procedure: MINOR REMOVAL PORT-A-CATH AT BEDSIDE;  Surgeon: Donnie Mesa, MD;  Location: [place];  Service: General;  Laterality: N/A;   PORTACATH PLACEMENT  07/07/2011   Procedure: INSERTION PORT-A-CATH;  Surgeon: Scherry Ran, MD;  [facility]: [facility];  Service: General;  Laterality: N/A;   PORTACATH PLACEMENT N/A 12/07/2012   Procedure: INSERTION PORT-A-CATH;  Surgeon: Scherry Ran, MD;  Location: [facility];  Service: General;  Laterality: N/A;  Attempted portacath placement on left and right side   URETEROSCOPY Right 12/16/2017   Procedure: DIAGNOSTIC RIGHT URETEROSCOPY;  Surgeon: Cleon Gustin, MD;  Location: [facility];  Service: Urology;  Laterality: Right;   WOUND DEBRIDEMENT Right 04/08/2017   Procedure: EXCISION ULCERATION RIGHT GREAT TOE;  Surgeon: Caprice Beaver, DPM;  Location: [facility];  Service: Podiatry;  Laterality: Right;   WRIST SURGERY     Left; removal of bone fragment    There were no vitals filed for this visit.               Wound Therapy - 05/17/21 1653      Subjective pt states his dressing slid down and is swollen up top.  States he has appt with [employer] on Monday for new garments.    Patient and Family Stated Goals wound to heal    Date of Onset 02/12/21    Prior Treatments compression, neosporin and other topicals    Evaluation and Treatment Procedures Explained to Patient/Family Yes    Evaluation and Treatment Procedures agreed to    Wound Properties Date First Assessed: 03/14/21 Time First Assessed: 0745 Wound Type: Skin tear Location: Pretibial Location Orientation: Distal;Right;Lateral Wound Description (Comments): lateral shin wound Present on Admission: Yes   Dressing Type Impregnated gauze (bismuth)    Dressing Changed Changed    Dressing Status None    Dressing Change Frequency PRN    % Wound base Red or Granulating 100%    Drainage Amount None  Treatment Cleansed    Selective Debridement - Location none needed    Selective Debridement - Tools Used Forceps    Selective Debridement - Tissue Removed devitalized tissue    Wound Therapy - Clinical Statement wound appears to be healed at this point, however 2 small blisters below wound area around lateral side of LE present but without broken skin.  LE is also swollen a little more above dressing where it has slid down.  suggested to  to bandage LE once more with profore applying extra cotton in negative areas to ensure wound is healed and blisters go away prior to transitioning to compression garments.  Instructed to remove dressings if they slide down and use his old comrpession garment until he gets his new one.  Pt verbalized understanding.    Wound Therapy - Functional Problem List walking    Factors Delaying/Impairing Wound Healing Diabetes Mellitus;Multiple medical problems    Hydrotherapy Plan Debridement;Dressing change;Patient/family education;Pulsatile lavage with suction    Wound Therapy - Frequency 2X / week    Wound Therapy - Current Recommendations PT     Wound Plan if wound remains healed and blisters are gone, discharge to maintenance and use of compression stockings.    Dressing  lotion,  profore compression system                     PT Education - 05/17/21 1659     Education Details different stockings,keeping LE's moisturized., shown juxtafit    Person(s) Educated Patient    Methods Explanation;Demonstration    Comprehension Verbalized understanding              PT Short Term Goals - 04/16/21 1525       PT SHORT TERM GOAL #1   Title Patient wound sizes to decrease 50% to decrease risk of infection.    Baseline 04/16/21:  1 wound remains, increased in size with culture complete this session    Status Partially Met      PT SHORT TERM GOAL #2   Title Patient to experience pain no more than 1/10 in wound area in order to show general improvement of condition     Status Achieved               PT Long Term Goals - 04/16/21 1526       PT LONG TERM GOAL #1   Title Patient to demonstrate full wound healing in order to show resolution of condition     Status On-going      PT LONG TERM GOAL #2   Title Patient and family to be independent in skin care strategies and edema control in order to assist in prevention of other wound formation     Baseline 11/15: Pt verbalized need for return to compression garment once wound is healed    Status Partially Met                    Patient will benefit from skilled therapeutic intervention in order to improve the following deficits and impairments:     Visit Diagnosis: Difficulty in walking, not elsewhere classified  Wound of right leg, subsequent encounter     Problem List Patient Active Problem List   Diagnosis Date Noted   Multiple myeloma in remission ([place]) 11/10/2017   CKD (chronic kidney disease) stage 3, GFR 30-59 ml/min ([place]) 70/62/3762   Chronic systolic CHF (congestive heart failure) ([place]) 11/10/2017   MRSA bacteremia 10/03/2017  Pressure injury of skin 08/04/2017   Fournier's gangrene of scrotum 04/14/2017   Hypogammaglobulinemia ([place]) 03/09/2016   Diaphragmatic hernia without obstruction 92/76/3943   Chronic systolic heart failure ([place]) 12/19/2013   Anemia, normocytic normochromic 10/09/2012   Chronic pancreatitis ([place]) 10/07/2012   Atrial flutter ([place]) 09/17/2012   DDD (degenerative disc disease), cervical 03/18/2012   Arteriosclerotic cardiovascular disease (ASCVD)    Hypertension    Hyperlipidemia    Multiple myeloma ([place]) 07/01/2011   Morbid obesity ([place]) 04/29/2010   Insulin dependent diabetes mellitus 11/15/2008   OSA on CPAP 11/15/2008   Teena Irani, PTA/CLT, WTA [phone]  Teena Irani, PTA 05/17/2021, 5:00 PM  [facility] [address] [place], [place], [address] Phone: [phone]   Fax:  [phone]  Name: [name] MRN: [id number] Date of Birth: 20-Feb-1961

## 2021-05-20 DIAGNOSIS — E1122 Type 2 diabetes mellitus with diabetic chronic kidney disease: Secondary | ICD-10-CM | POA: Diagnosis not present

## 2021-05-20 DIAGNOSIS — I5022 Chronic systolic (congestive) heart failure: Secondary | ICD-10-CM | POA: Diagnosis not present

## 2021-05-20 DIAGNOSIS — D631 Anemia in chronic kidney disease: Secondary | ICD-10-CM | POA: Diagnosis not present

## 2021-05-20 DIAGNOSIS — N189 Chronic kidney disease, unspecified: Secondary | ICD-10-CM | POA: Diagnosis not present

## 2021-05-21 ENCOUNTER — Ambulatory Visit (HOSPITAL_COMMUNITY): Payer: BC Managed Care – PPO

## 2021-05-21 ENCOUNTER — Other Ambulatory Visit: Payer: Self-pay

## 2021-05-21 ENCOUNTER — Encounter (HOSPITAL_COMMUNITY)
Admission: RE | Admit: 2021-05-21 | Discharge: 2021-05-21 | Disposition: A | Discharge status: Home / Self Care | Payer: BC Managed Care – PPO | Source: Ambulatory Visit | Attending: Nephrology | Admitting: Nephrology

## 2021-05-21 DIAGNOSIS — S81801D Unspecified open wound, right lower leg, subsequent encounter: Secondary | ICD-10-CM

## 2021-05-21 DIAGNOSIS — D631 Anemia in chronic kidney disease: Secondary | ICD-10-CM | POA: Diagnosis present

## 2021-05-21 DIAGNOSIS — R262 Difficulty in walking, not elsewhere classified: Secondary | ICD-10-CM | POA: Diagnosis not present

## 2021-05-21 DIAGNOSIS — N184 Chronic kidney disease, stage 4 (severe): Secondary | ICD-10-CM | POA: Diagnosis present

## 2021-05-21 LAB — POCT HEMOGLOBIN-HEMACUE: Hemoglobin: 9.5 g/dL — ABNORMAL LOW (ref 13.0–17.0)

## 2021-05-21 MED ORDER — EPOETIN ALFA-EPBX 10000 UNIT/ML IJ SOLN
10000.0000 [IU] | Freq: Once | INTRAMUSCULAR | Status: AC
  Administered 2021-05-21: 08:00:00 10000 [IU] via SUBCUTANEOUS

## 2021-05-21 MED ORDER — EPOETIN ALFA-EPBX 10000 UNIT/ML IJ SOLN
INTRAMUSCULAR | Status: AC
  Filled 2021-05-21: qty 1

## 2021-05-21 NOTE — Therapy (Signed)
[name], [place], [address] Phone: [phone]   Fax:  [phone]  Wound Care Therapy  Patient Details  Name: ODEL SCHMID MRN: [id number] Date of Birth: [birth year] Referring Provider (PT): [name] MD   Encounter Date: 05/21/2021   PT End of Session - 05/21/21 1648     Visit Number 20    Number of Visits 28    Date for PT Re-Evaluation 06/17/21    Authorization Type BCBS (no auth, no 30VL)    Progress Note Due on Visit 26    PT Start Time 1540    PT Stop Time 1612    PT Time Calculation (min) 32 min    Activity Tolerance Patient tolerated treatment well    Behavior During Therapy [facility] for tasks assessed/performed             Past Medical History:  Diagnosis Date   Abscess 04/2017   Scrotal   Anemia    Arteriosclerotic cardiovascular disease (ASCVD)    MI-2000s; stent to the proximal LAD and diagonal in 2001; stress nuclear in 2008-impaired exercise capacity, left ventricular dilatation, moderately to severely depressed EF, apical, inferior and anteroseptal scar   Arthritis    Atrial flutter (HCC)    Bence-Jones proteinuria 05/05/2011   Cellulitis of leg    both legs   Chronic diarrhea    Chronic kidney disease, stage 3, mod decreased GFR (HCC)    Creatinine of 1.84 in 06/2011 and 1.5 in 07/2011   Diabetes mellitus    Insulin   Dysrhythmia    AFlutter   GERD (gastroesophageal reflux disease)    Gout    Hyperlipidemia    Hypertension    Injection site reaction    Multiple myeloma 07/01/2011   Myocardial infarction ([place]) 2000   Obesity    Pedal edema    Venous insufficiency   Sleep apnea    uses cpap   Ulcer     Past Surgical History:  Procedure Laterality Date   ABSCESS DRAINAGE     Scrotal   BIOPSY  01/02/2012   Procedure: BIOPSY;  Surgeon: Rogene Houston, MD;  [facility];  Service: Endoscopy;  Laterality: N/A;   BONE MARROW BIOPSY  05/13/11   CARDIAC  CATHETERIZATION     cardiac stent   CARDIOVERSION N/A 10/13/2012   Procedure: CARDIOVERSION;  Surgeon: Yehuda Savannah, MD;  Location: [facility];  Service: Cardiovascular;  Laterality: N/A;   CATARACT EXTRACTION W/PHACO Left 02/13/2014   Procedure: CATARACT EXTRACTION PHACO AND INTRAOCULAR LENS PLACEMENT ([place]);  Surgeon: Tonny Branch, MD;  Location: [facility];  Service: Ophthalmology;  Laterality: Left;  CDE:  7.67   CATARACT EXTRACTION W/PHACO Right 03/02/2014   Procedure: CATARACT EXTRACTION PHACO AND INTRAOCULAR LENS PLACEMENT RIGHT EYE CDE=16.81;  Surgeon: Tonny Branch, MD;  Location: [facility];  Service: Ophthalmology;  Laterality: Right;   COLONOSCOPY  11/28/2011   Procedure: COLONOSCOPY;  Surgeon: Rogene Houston, MD;  [facility];  Service: Endoscopy;  Laterality: N/A;  [place] Right 12/16/2017   Procedure: CYSTOSCOPY WITH RIGHT RETROGRADE PYELOGRAM AND RIGHT URETERAL STENT REMOVAL;  Surgeon: Cleon Gustin, MD;  Location: [facility];  Service: Urology;  Laterality: Right;   CYSTOSCOPY W/ URETERAL STENT PLACEMENT Right 07/31/2017   Procedure: CYSTOSCOPY WITH RETROGRADE PYELOGRAM/URETERAL STENT PLACEMENT;  Surgeon: Cleon Gustin, MD;  [facility];  Service: Urology;  Laterality: Right;   ESOPHAGOGASTRODUODENOSCOPY  01/02/2012   Procedure: ESOPHAGOGASTRODUODENOSCOPY (EGD);  Surgeon: Rogene Houston, MD;  [facility];  Service: Endoscopy;  Laterality: N/A;  100   ESOPHAGOGASTRODUODENOSCOPY N/A 09/20/2012   Procedure: ESOPHAGOGASTRODUODENOSCOPY (EGD);  Surgeon: Rogene Houston, MD;  [facility];  Service: Endoscopy;  Laterality: N/A;   EUS N/A 10/07/2012   Procedure: UPPER ENDOSCOPIC ULTRASOUND (EUS) LINEAR;  Surgeon: Milus Banister, MD;  [facility];  Service: Endoscopy;  Laterality: N/A;   INCISION AND DRAINAGE ABSCESS N/A 04/14/2017   Procedure: INCISION AND DRAINAGE ABSCESS;  Surgeon: Ceasar Mons, MD;  [facility]  [facility];  Service: Urology;  Laterality: N/A;   INCISION AND DRAINAGE ABSCESS ANAL     IRRIGATION AND DEBRIDEMENT ABSCESS N/A 04/15/2017   Procedure: DEBRIDEMENT SCROTAL WOUND AND DRESSING CHANGE;  Surgeon: Ceasar Mons, MD;  [facility];  Service: Urology;  Laterality: N/A;   IRRIGATION AND DEBRIDEMENT ABSCESS N/A 04/17/2017   Procedure: IRRIGATION AND DEBRIDEMENT ABSCESS;  Surgeon: Ceasar Mons, MD;  [facility];  Service: Urology;  Laterality: N/A;  RM 3   LAPAROSCOPIC GASTRIC BANDING  2006   has been removed   OSTECTOMY Right 04/08/2017   Procedure: OSTECTOMY RIGHT GREAT TOE;  Surgeon: Caprice Beaver, DPM;  Location: [facility];  Service: Podiatry;  Laterality: Right;   PORT-A-CATH REMOVAL Left 12/07/2012   Procedure: REMOVAL PORT-A-CATH;  Surgeon: Scherry Ran, MD;  Location: [facility];  Service: General;  Laterality: Left;   PORT-A-CATH REMOVAL N/A 10/02/2017   Procedure: MINOR REMOVAL PORT-A-CATH AT BEDSIDE;  Surgeon: Donnie Mesa, MD;  Location: [facility] [place];  Service: General;  Laterality: N/A;   PORTACATH PLACEMENT  07/07/2011   Procedure: INSERTION PORT-A-CATH;  Surgeon: Scherry Ran, MD;  Location: [facility];  Service: General;  Laterality: N/A;   PORTACATH PLACEMENT N/A 12/07/2012   Procedure: INSERTION PORT-A-CATH;  Surgeon: Scherry Ran, MD;  Location: [facility];  Service: General;  Laterality: N/A;  Attempted portacath placement on left and right side   URETEROSCOPY Right 12/16/2017   Procedure: DIAGNOSTIC RIGHT URETEROSCOPY;  Surgeon: Cleon Gustin, MD;  Location: [facility];  Service: Urology;  Laterality: Right;   WOUND DEBRIDEMENT Right 04/08/2017   Procedure: EXCISION ULCERATION RIGHT GREAT TOE;  Surgeon: Caprice Beaver, DPM;  Location: [facility];  Service: Podiatry;  Laterality: Right;   WRIST SURGERY     Left; removal of bone fragment    There were no vitals filed for this visit.    Subjective Assessment - 05/21/21 1715      Subjective Pt stated dressings are comfortable, brought pair of compression garment with him for session today.  No reports of pain.    Currently in Pain? No/denies                 Therapist unable to document in flow sheet.  W: .3cm L: 34 cm D: .1cm  100% granulation tissue following debridement  Dressings: Vaseline, xeroform, profore with additional cotton for cone shape, netting #8             PT Short Term Goals - 04/16/21 1525       PT SHORT TERM GOAL #1   Title Patient wound sizes to decrease 50% to decrease risk of infection.    Baseline 04/16/21:  1 wound remains, increased in size with culture complete this session    Status Partially Met      PT SHORT TERM GOAL #2   Title Patient to  experience pain no more than 1/10 in wound area in order to show general improvement of condition     Status Achieved               PT Long Term Goals - 04/16/21 1526       PT LONG TERM GOAL #1   Title Patient to demonstrate full wound healing in order to show resolution of condition     Status On-going      PT LONG TERM GOAL #2   Title Patient and family to be independent in skin care strategies and edema control in order to assist in prevention of other wound formation     Baseline 11/15: Pt verbalized need for return to compression garment once wound is healed    Status Partially Met                   Plan - 05/21/21 1717     Clinical Impression Statement While gently cleasning LE scab removed reveiling wound present.  Measurements taken with W.3 x L.4x D.1, 100% granulated following cleansing for removal of dead skin perimeter.  Continued with xeroform and profore wiht additional cotton for cone shape.  Encouraged pt to remove dressings if slides down or gets wet and to replace wiht fresh bandages iwht verbalized understanding.  Picture taken of wound in media.  No reports of pain through session.    Personal Factors and Comorbidities Comorbidity  3+    Comorbidities CKD, HTN, HLD, hx MI, CVD, DM, lymphedema    Examination-Activity Limitations Bathing;Locomotion Level;Transfers;Stand    Examination-Participation Restrictions Occupation;Community Activity    Stability/Clinical Decision Making Stable/Uncomplicated    Clinical Decision Making Low    Rehab Potential Good    PT Frequency 2x / week    PT Duration 4 weeks    PT Treatment/Interventions ADLs/Self Care Home Management;Cryotherapy;Electrical Stimulation;Moist Heat;Gait training;Stair training;Functional mobility training;Therapeutic activities;Therapeutic exercise;Balance training;Neuromuscular re-education;Patient/family education;Manual lymph drainage;Manual techniques;Compression bandaging;Scar mobilization    PT Next Visit Plan Progress to compression garment when would healed.  change dressings and debride PRN to promote wound healing    Consulted and Agree with Plan of Care Patient             Patient will benefit from skilled therapeutic intervention in order to improve the following deficits and impairments:  Abnormal gait, Difficulty walking, Decreased skin integrity  Visit Diagnosis: Difficulty in walking, not elsewhere classified  Wound of right leg, subsequent encounter     Problem List Patient Active Problem List   Diagnosis Date Noted   Multiple myeloma in remission ([place]) 11/10/2017   CKD (chronic kidney disease) stage 3, GFR 30-59 ml/min (HCC) 60/03/9322   Chronic systolic CHF (congestive heart failure) ([place]) 11/10/2017   MRSA bacteremia 10/03/2017   Pressure injury of skin 08/04/2017   Fournier's gangrene of scrotum 04/14/2017   Hypogammaglobulinemia ([place]) 03/09/2016   Diaphragmatic hernia without obstruction 55/73/2202   Chronic systolic heart failure ([place]) 12/19/2013   Anemia, normocytic normochromic 10/09/2012   Chronic pancreatitis ([place]) 10/07/2012   Atrial flutter ([place]) 09/17/2012   DDD (degenerative disc disease), cervical 03/18/2012    Arteriosclerotic cardiovascular disease (ASCVD)    Hypertension    Hyperlipidemia    Multiple myeloma ([place]) 07/01/2011   Morbid obesity ([place]) 04/29/2010   Insulin dependent diabetes mellitus 11/15/2008   OSA on CPAP 11/15/2008   Ihor Austin, LPTA/CLT; CBIS [phone]  Aldona Lento, PTA 05/21/2021, 5:21 PM  [facility] [address]  [place], [place], [address] Phone: [phone]   Fax:  [phone]  Name: [name] MRN: [id number] Date of Birth: Mar 30, 1961

## 2021-05-22 DIAGNOSIS — D631 Anemia in chronic kidney disease: Secondary | ICD-10-CM | POA: Diagnosis not present

## 2021-05-22 DIAGNOSIS — N170 Acute kidney failure with tubular necrosis: Secondary | ICD-10-CM | POA: Diagnosis not present

## 2021-05-22 DIAGNOSIS — R808 Other proteinuria: Secondary | ICD-10-CM | POA: Diagnosis not present

## 2021-05-22 DIAGNOSIS — N189 Chronic kidney disease, unspecified: Secondary | ICD-10-CM | POA: Diagnosis not present

## 2021-05-23 ENCOUNTER — Other Ambulatory Visit: Payer: Self-pay | Admitting: Cardiology

## 2021-05-23 ENCOUNTER — Encounter (HOSPITAL_COMMUNITY): Payer: Self-pay

## 2021-05-23 ENCOUNTER — Ambulatory Visit (HOSPITAL_COMMUNITY): Payer: BC Managed Care – PPO

## 2021-05-23 ENCOUNTER — Other Ambulatory Visit: Payer: Self-pay

## 2021-05-23 DIAGNOSIS — R262 Difficulty in walking, not elsewhere classified: Secondary | ICD-10-CM

## 2021-05-23 NOTE — Therapy (Signed)
[place], [place], [address] Phone: [phone]   Fax:  [phone]  Wound Care Therapy  Patient Details  Name: [name] MRN: [id number] Date of Birth: June 04, 1960 Referring Provider (PT): [name] MD   Encounter Date: 05/23/2021   PT End of Session - 05/23/21 1639     Visit Number 21    Number of Visits 28    Date for PT Re-Evaluation 06/17/21    Authorization Type BCBS (no auth, no 30VL)    Progress Note Due on Visit 26    PT Start Time 1537    PT Stop Time 1620    PT Time Calculation (min) 43 min    Activity Tolerance Patient tolerated treatment well    Behavior During Therapy [facility] for tasks assessed/performed             Past Medical History:  Diagnosis Date   Abscess 04/2017   Scrotal   Anemia    Arteriosclerotic cardiovascular disease (ASCVD)    MI-2000s; stent to the proximal LAD and diagonal in 2001; stress nuclear in 2008-impaired exercise capacity, left ventricular dilatation, moderately to severely depressed EF, apical, inferior and anteroseptal scar   Arthritis    Atrial flutter (HCC)    Bence-Jones proteinuria 05/05/2011   Cellulitis of leg    both legs   Chronic diarrhea    Chronic kidney disease, stage 3, mod decreased GFR (HCC)    Creatinine of 1.84 in 06/2011 and 1.5 in 07/2011   Diabetes mellitus    Insulin   Dysrhythmia    AFlutter   GERD (gastroesophageal reflux disease)    Gout    Hyperlipidemia    Hypertension    Injection site reaction    Multiple myeloma 07/01/2011   Myocardial infarction ([place]) 2000   Obesity    Pedal edema    Venous insufficiency   Sleep apnea    uses cpap   Ulcer     Past Surgical History:  Procedure Laterality Date   ABSCESS DRAINAGE     Scrotal   BIOPSY  01/02/2012   Procedure: BIOPSY;  Surgeon: Rogene Houston, MD;  [facility];  Service: Endoscopy;  Laterality: N/A;   BONE MARROW BIOPSY  05/13/11   CARDIAC  CATHETERIZATION     cardiac stent   CARDIOVERSION N/A 10/13/2012   Procedure: CARDIOVERSION;  Surgeon: Yehuda Savannah, MD;  Location: [facility];  Service: Cardiovascular;  Laterality: N/A;   CATARACT EXTRACTION W/PHACO Left 02/13/2014   Procedure: CATARACT EXTRACTION PHACO AND INTRAOCULAR LENS PLACEMENT ([place]);  Surgeon: Tonny Branch, MD;  Location: [facility];  Service: Ophthalmology;  Laterality: Left;  CDE:  7.67   CATARACT EXTRACTION W/PHACO Right 03/02/2014   Procedure: CATARACT EXTRACTION PHACO AND INTRAOCULAR LENS PLACEMENT RIGHT EYE CDE=16.81;  Surgeon: Tonny Branch, MD;  Location: [facility];  Service: Ophthalmology;  Laterality: Right;   COLONOSCOPY  11/28/2011   Procedure: COLONOSCOPY;  Surgeon: Rogene Houston, MD;  [facility];  Service: Endoscopy;  Laterality: N/A;  [place] Right 12/16/2017   Procedure: CYSTOSCOPY WITH RIGHT RETROGRADE PYELOGRAM AND RIGHT URETERAL STENT REMOVAL;  Surgeon: Cleon Gustin, MD;  Location: [facility];  Service: Urology;  Laterality: Right;   CYSTOSCOPY W/ URETERAL STENT PLACEMENT Right 07/31/2017   Procedure: CYSTOSCOPY WITH RETROGRADE PYELOGRAM/URETERAL STENT PLACEMENT;  Surgeon: Cleon Gustin, MD;  [facility];  Service: Urology;  Laterality: Right;   ESOPHAGOGASTRODUODENOSCOPY  01/02/2012   Procedure: ESOPHAGOGASTRODUODENOSCOPY (EGD);  Surgeon: Rogene Houston, MD;  [facility];  Service: Endoscopy;  Laterality: N/A;  100   ESOPHAGOGASTRODUODENOSCOPY N/A 09/20/2012   Procedure: ESOPHAGOGASTRODUODENOSCOPY (EGD);  Surgeon: Rogene Houston, MD;  [facility];  Service: Endoscopy;  Laterality: N/A;   EUS N/A 10/07/2012   Procedure: UPPER ENDOSCOPIC ULTRASOUND (EUS) LINEAR;  Surgeon: Milus Banister, MD;  [facility];  Service: Endoscopy;  Laterality: N/A;   INCISION AND DRAINAGE ABSCESS N/A 04/14/2017   Procedure: INCISION AND DRAINAGE ABSCESS;  Surgeon: Ceasar Mons, MD;  [facility]  [facility];  Service: Urology;  Laterality: N/A;   INCISION AND DRAINAGE ABSCESS ANAL     IRRIGATION AND DEBRIDEMENT ABSCESS N/A 04/15/2017   Procedure: DEBRIDEMENT SCROTAL WOUND AND DRESSING CHANGE;  Surgeon: Ceasar Mons, MD;  [facility];  Service: Urology;  Laterality: N/A;   IRRIGATION AND DEBRIDEMENT ABSCESS N/A 04/17/2017   Procedure: IRRIGATION AND DEBRIDEMENT ABSCESS;  Surgeon: Ceasar Mons, MD;  [facility];  Service: Urology;  Laterality: N/A;  RM 3   LAPAROSCOPIC GASTRIC BANDING  2006   has been removed   OSTECTOMY Right 04/08/2017   Procedure: OSTECTOMY RIGHT GREAT TOE;  Surgeon: Caprice Beaver, DPM;  Location: [facility];  Service: Podiatry;  Laterality: Right;   PORT-A-CATH REMOVAL Left 12/07/2012   Procedure: REMOVAL PORT-A-CATH;  Surgeon: Scherry Ran, MD;  Location: [facility];  Service: General;  Laterality: Left;   PORT-A-CATH REMOVAL N/A 10/02/2017   Procedure: MINOR REMOVAL PORT-A-CATH AT BEDSIDE;  Surgeon: Donnie Mesa, MD;  Location: [facility];  Service: General;  Laterality: N/A;   PORTACATH PLACEMENT  07/07/2011   Procedure: INSERTION PORT-A-CATH;  Surgeon: Scherry Ran, MD;  [facility];  Service: General;  Laterality: N/A;   PORTACATH PLACEMENT N/A 12/07/2012   Procedure: INSERTION PORT-A-CATH;  Surgeon: Scherry Ran, MD;  Location: [facility];  Service: General;  Laterality: N/A;  Attempted portacath placement on left and right side   URETEROSCOPY Right 12/16/2017   Procedure: DIAGNOSTIC RIGHT URETEROSCOPY;  Surgeon: Cleon Gustin, MD;  Location: [facility];  Service: Urology;  Laterality: Right;   WOUND DEBRIDEMENT Right 04/08/2017   Procedure: EXCISION ULCERATION RIGHT GREAT TOE;  Surgeon: Caprice Beaver, DPM;  Location: [facility];  Service: Podiatry;  Laterality: Right;   WRIST SURGERY     Left; removal of bone fragment    There were no vitals filed for this visit.    Subjective Assessment - 05/23/21 1632      Subjective Pt arrived wiht new compression garments, stated he got [facility] the [facility] yesterday.    Currently in Pain? No/denies                       Wound Therapy - 05/23/21 0001     Subjective Pt arrived wiht new compression garments, stated he got from the [facility] yesterday.    Patient and Family Stated Goals wound to heal    Date of Onset 02/12/21    Prior Treatments compression, neosporin and other topicals    Pain Scale 0-10    Pain Score 0-No pain    Evaluation and Treatment Procedures Explained to Patient/Family Yes    Evaluation and Treatment Procedures agreed to    Wound Properties Date First Assessed: 03/14/21 Time First Assessed: 0745 Wound Type: Skin tear Location: Pretibial Location Orientation: Distal;Right;Lateral Wound Description (Comments): lateral shin wound Present on Admission: Yes  Wound Image Images linked: 1    Dressing Type Impregnated gauze (bismuth);Compression wrap    Dressing Changed Changed    Dressing Status None    Dressing Change Frequency PRN    Site / Wound Assessment Granulation tissue    % Wound base Red or Granulating 100%    Peri-wound Assessment Edema    Wound Length (cm) 0.4 cm    Wound Width (cm) 0.3 cm    Wound Depth (cm) 0.1 cm    Wound Volume (cm^3) 0.01 cm^3    Wound Surface Area (cm^2) 0.12 cm^2    Drainage Amount None    Treatment Cleansed    Selective Debridement - Location none needed    Selective Debridement - Tools Used Forceps    Selective Debridement - Tissue Removed devitalized tissue    Wound Therapy - Clinical Statement Wound still present, cleansing only required for removal of devitalized tissue and dry skin perimeter.  Pt educated on donning compression garments that required some assistance from therapist.  Pt sensitive to adhesive bandages so attempted xeroform with medipore tape.  Noted 3-4 inches lacking from meeting knee, pt educated on importance of proper fit.  [facility] concerning  proper fit, they do not offer tall compression garments with open toe.  Pt given list of suppliers to ask if tall option is availabe for proper fit.  Continued with profore for compression wiht additional cotton added for cone shape to reduce sliding down.  Reports of comfort at [facility].    Wound Therapy - Functional Problem List walking    Factors Delaying/Impairing Wound Healing Diabetes Mellitus;Multiple medical problems    Hydrotherapy Plan Debridement;Dressing change;Patient/family education;Pulsatile lavage with suction    Wound Therapy - Frequency 2X / week    Wound Therapy - Current Recommendations PT    Wound Plan if wound remains healed and blisters are gone, discharge to maintenance and use of compression stockings.    Dressing  lotion, xeroform, profore compression system                       PT Short Term Goals - 04/16/21 1525       PT SHORT TERM GOAL #1   Title Patient wound sizes to decrease 50% to decrease risk of infection.    Baseline 04/16/21:  1 wound remains, increased in size with culture complete this session    Status Partially Met      PT SHORT TERM GOAL #2   Title Patient to experience pain no more than 1/10 in wound area in order to show general improvement of condition     Status Achieved               PT Long Term Goals - 04/16/21 1526       PT LONG TERM GOAL #1   Title Patient to demonstrate full wound healing in order to show resolution of condition     Status On-going      PT LONG TERM GOAL #2   Title Patient and family to be independent in skin care strategies and edema control in order to assist in prevention of other wound formation     Baseline 11/15: Pt verbalized need for return to compression garment once wound is healed    Status Partially Met                    Patient will benefit from skilled therapeutic intervention in order to improve the  following deficits and impairments:     Visit Diagnosis: Difficulty  in walking, not elsewhere classified     Problem List Patient Active Problem List   Diagnosis Date Noted   Multiple myeloma in remission ([place]) 11/10/2017   CKD (chronic kidney disease) stage 3, GFR 30-59 ml/min (HCC) 88/30/1415   Chronic systolic CHF (congestive heart failure) ([place]) 11/10/2017   MRSA bacteremia 10/03/2017   Pressure injury of skin 08/04/2017   Fournier's gangrene of scrotum 04/14/2017   Hypogammaglobulinemia ([place]) 03/09/2016   Diaphragmatic hernia without obstruction 97/33/1250   Chronic systolic heart failure ([place]) 12/19/2013   Anemia, normocytic normochromic 10/09/2012   Chronic pancreatitis ([place]) 10/07/2012   Atrial flutter ([place]) 09/17/2012   DDD (degenerative disc disease), cervical 03/18/2012   Arteriosclerotic cardiovascular disease (ASCVD)    Hypertension    Hyperlipidemia    Multiple myeloma ([place]) 07/01/2011   Morbid obesity ([place]) 04/29/2010   Insulin dependent diabetes mellitus 11/15/2008   OSA on CPAP 11/15/2008   Ihor Austin, LPTA/CLT; CBIS [phone]  Aldona Lento, PTA 05/23/2021, 4:42 PM  [facility] [address] [place], [place], [address] Phone: [phone]   Fax:  [phone]  Name: [name] MRN: [id number] Date of Birth: 1960-10-24

## 2021-05-28 DIAGNOSIS — E1142 Type 2 diabetes mellitus with diabetic polyneuropathy: Secondary | ICD-10-CM | POA: Diagnosis not present

## 2021-05-29 ENCOUNTER — Ambulatory Visit (HOSPITAL_COMMUNITY): Payer: BC Managed Care – PPO

## 2021-05-29 ENCOUNTER — Other Ambulatory Visit: Payer: Self-pay

## 2021-05-29 ENCOUNTER — Encounter (HOSPITAL_COMMUNITY): Payer: Self-pay

## 2021-05-29 DIAGNOSIS — R262 Difficulty in walking, not elsewhere classified: Secondary | ICD-10-CM | POA: Diagnosis not present

## 2021-05-29 DIAGNOSIS — S81801D Unspecified open wound, right lower leg, subsequent encounter: Secondary | ICD-10-CM

## 2021-05-29 NOTE — Therapy (Signed)
[facility] °[address] °[place], [place], [address] °Phone: [phone]   Fax:  [phone] ° °Wound Care Therapy ° °Patient Details  °Name: [name] °MRN: [id number] °Date of Birth: 05/16/1961 °Referring Provider (PT): Gerald Hill MD ° ° °Encounter Date: 05/29/2021 ° ° PT End of Session - 05/29/21 1020   ° ° Visit Number 22   ° Number of Visits 28   ° Date for PT Re-Evaluation 06/17/21   ° Authorization Type BCBS (no auth, no 30VL)   ° Progress Note Due on Visit 26   ° PT Start Time 0925   ° PT Stop Time 1000   ° PT Time Calculation (min) 35 min   ° Activity Tolerance Patient tolerated treatment well   ° Behavior During Therapy WFL for tasks assessed/performed   ° °  °  ° °  ° ° °Past Medical History:  °Diagnosis Date  ° Abscess 04/2017  ° Scrotal  ° Anemia   ° Arteriosclerotic cardiovascular disease (ASCVD)   ° MI-2000s; stent to the proximal LAD and diagonal in 2001; stress nuclear in 2008-impaired exercise capacity, left ventricular dilatation, moderately to severely depressed EF, apical, inferior and anteroseptal scar  ° Arthritis   ° Atrial flutter (HCC)   ° Bence-Jones proteinuria 05/05/2011  ° Cellulitis of leg   ° both legs  ° Chronic diarrhea   ° Chronic kidney disease, stage 3, mod decreased GFR (HCC)   ° Creatinine of 1.84 in 06/2011 and 1.5 in 07/2011  ° Diabetes mellitus   ° Insulin  ° Dysrhythmia   ° AFlutter  ° GERD (gastroesophageal reflux disease)   ° Gout   ° Hyperlipidemia   ° Hypertension   ° Injection site reaction   ° Multiple myeloma 07/01/2011  ° Myocardial infarction (HCC) 2000  ° Obesity   ° Pedal edema   ° Venous insufficiency  ° Sleep apnea   ° uses cpap  ° Ulcer   ° ° °Past Surgical History:  °Procedure Laterality Date  ° ABSCESS DRAINAGE    ° Scrotal  ° BIOPSY  01/02/2012  ° Procedure: BIOPSY;  Surgeon: Najeeb U Rehman, MD;  [facility];  Service: Endoscopy;  Laterality: N/A;  ° BONE MARROW BIOPSY  05/13/11  ° CARDIAC  CATHETERIZATION    ° cardiac stent  ° CARDIOVERSION N/A 10/13/2012  ° Procedure: CARDIOVERSION;  Surgeon: Robert M Rothbart, MD;  [facility];  Service: Cardiovascular;  Laterality: N/A;  ° CATARACT EXTRACTION W/PHACO Left 02/13/2014  ° Procedure: CATARACT EXTRACTION PHACO AND INTRAOCULAR LENS PLACEMENT (IOC);  Surgeon: Kerry Hunt, MD;  Location: [facility];  Service: Ophthalmology;  Laterality: Left;  CDE:  7.67  ° CATARACT EXTRACTION W/PHACO Right 03/02/2014  ° Procedure: CATARACT EXTRACTION PHACO AND INTRAOCULAR LENS PLACEMENT RIGHT EYE CDE=16.81;  Surgeon: Kerry Hunt, MD;  [facility];  Service: Ophthalmology;  Laterality: Right;  ° COLONOSCOPY  11/28/2011  ° Procedure: COLONOSCOPY;  Surgeon: Najeeb U Rehman, MD;  [facility];  Service: Endoscopy;  Laterality: N/A;  930  ° CYSTOSCOPY W/ RETROGRADES Right 12/16/2017  ° Procedure: CYSTOSCOPY WITH RIGHT RETROGRADE PYELOGRAM AND RIGHT URETERAL STENT REMOVAL;  Surgeon: McKenzie, Patrick L, MD;  Location: [facility];  Service: Urology;  Laterality: Right;  ° CYSTOSCOPY W/ URETERAL STENT PLACEMENT Right 07/31/2017  ° Procedure: CYSTOSCOPY WITH RETROGRADE PYELOGRAM/URETERAL STENT PLACEMENT;  Surgeon: McKenzie, Patrick L, MD;  [facility];  Service: Urology;  Laterality: Right;  ° ESOPHAGOGASTRODUODENOSCOPY    01/02/2012  ° Procedure: ESOPHAGOGASTRODUODENOSCOPY (EGD);  Surgeon: Najeeb U Rehman, MD;  [facility];  Service: Endoscopy;  Laterality: N/A;  100  ° ESOPHAGOGASTRODUODENOSCOPY N/A 09/20/2012  ° Procedure: ESOPHAGOGASTRODUODENOSCOPY (EGD);  Surgeon: Najeeb U Rehman, MD;  [facility];  Service: Endoscopy;  Laterality: N/A;  ° EUS N/A 10/07/2012  ° Procedure: UPPER ENDOSCOPIC ULTRASOUND (EUS) LINEAR;  Surgeon: Daniel P Jacobs, MD;  [facility];  Service: Endoscopy;  Laterality: N/A;  ° INCISION AND DRAINAGE ABSCESS N/A 04/14/2017  ° Procedure: INCISION AND DRAINAGE ABSCESS;  Surgeon: Winter, Christopher Aaron, MD;  [facility];  Service: Urology;  Laterality: N/A;  ° INCISION AND DRAINAGE ABSCESS ANAL    ° IRRIGATION AND DEBRIDEMENT ABSCESS N/A 04/15/2017  ° Procedure: DEBRIDEMENT SCROTAL WOUND AND DRESSING CHANGE;  Surgeon: Winter, Christopher Aaron, MD;  [facility];  Service: Urology;  Laterality: N/A;  ° IRRIGATION AND DEBRIDEMENT ABSCESS N/A 04/17/2017  ° Procedure: IRRIGATION AND DEBRIDEMENT ABSCESS;  Surgeon: Winter, Christopher Aaron, MD;  [facility];  Service: Urology;  Laterality: N/A;  RM 3  ° LAPAROSCOPIC GASTRIC BANDING  2006  ° has been removed  ° OSTECTOMY Right 04/08/2017  ° Procedure: OSTECTOMY RIGHT GREAT TOE;  Surgeon: McKinney, Benjamin, DPM;  [facility];  Service: Podiatry;  Laterality: Right;  ° PORT-A-CATH REMOVAL Left 12/07/2012  ° Procedure: REMOVAL PORT-A-CATH;  Surgeon: William S Bradford, MD;  Location[facility];  Service: General;  Laterality: Left;  ° PORT-A-CATH REMOVAL N/A 10/02/2017  ° Procedure: MINOR REMOVAL PORT-A-CATH AT BEDSIDE;  Surgeon: Tsuei, Matthew, MD;  [facility];  Service: General;  Laterality: N/A;  ° PORTACATH PLACEMENT  07/07/2011  ° Procedure: INSERTION PORT-A-CATH;  Surgeon: William S Bradford, MD;  Location[facility];  Service: General;  Laterality: N/A;  ° PORTACATH PLACEMENT N/A 12/07/2012  ° Procedure: INSERTION PORT-A-CATH;  Surgeon: William S Bradford, MD;  Location: [facility];  Service: General;  Laterality: N/A;  Attempted portacath placement on left and right side  ° URETEROSCOPY Right 12/16/2017  ° Procedure: DIAGNOSTIC RIGHT URETEROSCOPY;  Surgeon: McKenzie, Patrick L, MD;  [facility];  Service: Urology;  Laterality: Right;  ° WOUND DEBRIDEMENT Right 04/08/2017  ° Procedure: EXCISION ULCERATION RIGHT GREAT TOE;  Surgeon: McKinney, Benjamin, DPM;  [facility];  Service: Podiatry;  Laterality: Right;  ° WRIST SURGERY    ° Left; removal of bone fragment  ° ° °There were no vitals filed for this visit. ° ° ° Subjective Assessment - 05/29/21 1007   ° °  Subjective Pt arrived with wife, reports he pulled off some skin Lt shin while removing compression garments.   ° Currently in Pain? No/denies   ° °  °  ° °  ° ° ° ° ° ° ° ° ° ° ° ° Wound Therapy - 05/29/21 0001   ° ° Subjective Pt arrived with wife, reports he pulled off some skin Lt shin while removing compression garments.   ° Patient and Family Stated Goals wound to heal   ° Date of Onset 02/12/21   ° Prior Treatments compression, neosporin and other topicals   ° Pain Scale 0-10   ° Pain Score 0-No pain   ° Evaluation and Treatment Procedures Explained to Patient/Family Yes   ° Evaluation and Treatment Procedures agreed to   ° Wound Properties Date First Assessed: 03/14/21 Time First Assessed: 0745 Wound Type: Skin tear Location: Pretibial Location Orientation: Distal;Right;Lateral Wound Description (Comments): lateral shin   wound Present on Admission: Yes  ° Dressing Type None   ° Dressing Changed Other (Comment)   Removed  ° Dressing Status None   ° Dressing Change Frequency --   DC  ° Site / Wound Assessment Granulation tissue   ° % Wound base Red or Granulating 100%   ° Wound Length (cm) 0 cm   ° Wound Width (cm) 0 cm   ° Wound Depth (cm) 0 cm   ° Wound Volume (cm^3) 0 cm^3   ° Wound Surface Area (cm^2) 0 cm^2   ° Drainage Amount None   ° Treatment Cleansed   ° Selective Debridement - Location none needed   ° Wound Therapy - Clinical Statement Rt LE fully healed upon removal of dressings.  Pt arrived with 2 small superficial openings on Lt shin, picutre taken, measurements taken (superior L1.5x W1.8cm; inferior L.7x W.8) and instructed proper care at home.  Referral sent for Lt shin wounds.   ° Wound Therapy - Functional Problem List walking   ° Factors Delaying/Impairing Wound Healing Diabetes Mellitus;Multiple medical problems   ° Hydrotherapy Plan Debridement;Dressing change;Patient/family education;Pulsatile lavage with suction   ° Wound Therapy - Frequency 2X / week   ° Wound Therapy - Current  Recommendations PT   ° Wound Plan DC wound care to Rt.  Referral sent for Lt LE.   ° Dressing  pt.'s wife donned compressoin garments.   ° Dressing gauze applied to Lt shin.   ° °  °  ° °  ° ° ° ° ° ° ° ° ° ° PT Education - 05/29/21 1020   ° ° Education Details Instructed proper care for Rt LE, given handout with list of places to purchase compression garments.  Instructed self care for Lt LE new wound care.   ° Person(s) Educated Patient   ° Methods Explanation;Demonstration;Verbal cues   ° Comprehension Verbalized understanding;Need further instruction   ° °  °  ° °  ° ° ° PT Short Term Goals - 04/16/21 1525   ° °  ° PT SHORT TERM GOAL #1  ° Title Patient wound sizes to decrease 50% to decrease risk of infection.   ° Baseline 04/16/21:  1 wound remains, increased in size with culture complete this session   ° Status Partially Met   °  ° PT SHORT TERM GOAL #2  ° Title Patient to experience pain no more than 1/10 in wound area in order to show general improvement of condition    ° Status Achieved   ° °  °  ° °  ° ° ° ° PT Long Term Goals - 04/16/21 1526   ° °  ° PT LONG TERM GOAL #1  ° Title Patient to demonstrate full wound healing in order to show resolution of condition    ° Status On-going   °  ° PT LONG TERM GOAL #2  ° Title Patient and family to be independent in skin care strategies and edema control in order to assist in prevention of other wound formation    ° Baseline 11/15: Pt verbalized need for return to compression garment once wound is healed   ° Status Partially Met   ° °  °  ° °  ° ° ° ° ° ° ° ° ° °Patient will benefit from skilled therapeutic intervention in order to improve the following deficits and impairments:    ° °Visit Diagnosis: °Difficulty in walking, not elsewhere classified ° °Wound of right leg,   subsequent encounter     Problem List Patient Active Problem List   Diagnosis Date Noted   Multiple myeloma in remission ([place]) 11/10/2017   CKD (chronic kidney disease) stage 3, GFR  30-59 ml/min (HCC) 12/11/1973   Chronic systolic CHF (congestive heart failure) ([place]) 11/10/2017   MRSA bacteremia 10/03/2017   Pressure injury of skin 08/04/2017   Fournier's gangrene of scrotum 04/14/2017   Hypogammaglobulinemia ([place]) 03/09/2016   Diaphragmatic hernia without obstruction 88/32/5498   Chronic systolic heart failure ([place]) 12/19/2013   Anemia, normocytic normochromic 10/09/2012   Chronic pancreatitis ([place]) 10/07/2012   Atrial flutter ([place]) 09/17/2012   DDD (degenerative disc disease), cervical 03/18/2012   Arteriosclerotic cardiovascular disease (ASCVD)    Hypertension    Hyperlipidemia    Multiple myeloma ([place]) 07/01/2011   Morbid obesity ([place]) 04/29/2010   Insulin dependent diabetes mellitus 11/15/2008   OSA on CPAP 11/15/2008   Ihor Austin, LPTA/CLT; CBIS [phone]  Aldona Lento, PTA 05/29/2021, 10:22 AM  [facility] [address] [place], [place], [address] Phone: [phone]   Fax:  [phone]  Name: [name] MRN: [id number] Date of Birth: 1961-01-22

## 2021-05-31 ENCOUNTER — Other Ambulatory Visit: Payer: Self-pay

## 2021-05-31 ENCOUNTER — Ambulatory Visit (HOSPITAL_COMMUNITY): Payer: BC Managed Care – PPO | Admitting: Physical Therapy

## 2021-05-31 DIAGNOSIS — R262 Difficulty in walking, not elsewhere classified: Secondary | ICD-10-CM | POA: Diagnosis not present

## 2021-05-31 DIAGNOSIS — S81801D Unspecified open wound, right lower leg, subsequent encounter: Secondary | ICD-10-CM

## 2021-05-31 NOTE — Therapy (Signed)
[place], [place], [address] Phone: [phone]   Fax:  [phone]  Wound Care Therapy  Patient Details  Name: [name] MRN: [id number] Date of Birth: Feb 06, 1961 Referring Provider (PT): [name] MD  PHYSICAL THERAPY DISCHARGE SUMMARY  Visits from Start of Care: 23  Current functional level related to goals / functional outcomes: Wounds are 100% granulated.  Wound on Rt Le which pt was referred for is .2x 0.2 cm    Remaining deficits: New wounds on LT LE but they are 100% granulated just occurred there is no skilled reason to see these wounds at this time.    Education / Equipment: Self care for wounds    Patient agrees to discharge. Patient goals were met. Patient is being discharged due to meeting the stated rehab goals.  Encounter Date: 05/31/2021   PT End of Session - 05/31/21 1000     Visit Number 23    Number of Visits 23    Authorization Type [employer] (no auth, no [id number])    PT Start Time 0920    PT Stop Time 0950    PT Time Calculation (min) 30 min    Activity Tolerance Patient tolerated treatment well    Behavior During Therapy [facility] for tasks assessed/performed             Past Medical History:  Diagnosis Date   Abscess 04/2017   Scrotal   Anemia    Arteriosclerotic cardiovascular disease (ASCVD)    MI-2000s; stent to the proximal LAD and diagonal in 2001; stress nuclear in 2008-impaired exercise capacity, left ventricular dilatation, moderately to severely depressed EF, apical, inferior and anteroseptal scar   Arthritis    Atrial flutter (HCC)    Bence-Jones proteinuria 05/05/2011   Cellulitis of leg    both legs   Chronic diarrhea    Chronic kidney disease, stage 3, mod decreased GFR (HCC)    Creatinine of 1.84 in 06/2011 and 1.5 in 07/2011   Diabetes mellitus    Insulin   Dysrhythmia    AFlutter   GERD (gastroesophageal reflux disease)    Gout    Hyperlipidemia     Hypertension    Injection site reaction    Multiple myeloma 07/01/2011   Myocardial infarction ([place]) 2000   Obesity    Pedal edema    Venous insufficiency   Sleep apnea    uses cpap   Ulcer     Past Surgical History:  Procedure Laterality Date   ABSCESS DRAINAGE     Scrotal   BIOPSY  01/02/2012   Procedure: BIOPSY;  Surgeon: Rogene Houston, MD;  [facility];  Service: Endoscopy;  Laterality: N/A;   BONE MARROW BIOPSY  05/13/11   CARDIAC CATHETERIZATION     cardiac stent   CARDIOVERSION N/A 10/13/2012   Procedure: CARDIOVERSION;  Surgeon: Yehuda Savannah, MD;  Location: [facility];  Service: Cardiovascular;  Laterality: N/A;   CATARACT EXTRACTION W/PHACO Left 02/13/2014   Procedure: CATARACT EXTRACTION PHACO AND INTRAOCULAR LENS PLACEMENT ([place]);  Surgeon: Tonny Branch, MD;  Location: [facility];  Service: Ophthalmology;  Laterality: Left;  CDE:  7.67   CATARACT EXTRACTION W/PHACO Right 03/02/2014   Procedure: CATARACT EXTRACTION PHACO AND INTRAOCULAR LENS PLACEMENT RIGHT EYE CDE=16.81;  Surgeon: Tonny Branch, MD;  Location: [facility];  Service: Ophthalmology;  Laterality: Right;   COLONOSCOPY  11/28/2011   Procedure: COLONOSCOPY;  Surgeon: Rogene Houston, MD;  Location[facility];  Service: Endoscopy;  Laterality: N/A;  [place] Right 12/16/2017   Procedure: CYSTOSCOPY WITH RIGHT RETROGRADE PYELOGRAM AND RIGHT URETERAL STENT REMOVAL;  Surgeon: Cleon Gustin, MD;  Location: [facility];  Service: Urology;  Laterality: Right;   CYSTOSCOPY W/ URETERAL STENT PLACEMENT Right 07/31/2017   Procedure: CYSTOSCOPY WITH RETROGRADE PYELOGRAM/URETERAL STENT PLACEMENT;  Surgeon: Cleon Gustin, MD;  [facility]: [facility];  Service: Urology;  Laterality: Right;   ESOPHAGOGASTRODUODENOSCOPY  01/02/2012   Procedure: ESOPHAGOGASTRODUODENOSCOPY (EGD);  Surgeon: Rogene Houston, MD;  [facility];  Service: Endoscopy;  Laterality: N/A;  100   ESOPHAGOGASTRODUODENOSCOPY N/A  09/20/2012   Procedure: ESOPHAGOGASTRODUODENOSCOPY (EGD);  Surgeon: Rogene Houston, MD;  [facility];  Service: Endoscopy;  Laterality: N/A;   EUS N/A 10/07/2012   Procedure: UPPER ENDOSCOPIC ULTRASOUND (EUS) LINEAR;  Surgeon: Milus Banister, MD;  [facility];  Service: Endoscopy;  Laterality: N/A;   INCISION AND DRAINAGE ABSCESS N/A 04/14/2017   Procedure: INCISION AND DRAINAGE ABSCESS;  Surgeon: Ceasar Mons, MD;  [facility];  Service: Urology;  Laterality: N/A;   INCISION AND DRAINAGE ABSCESS ANAL     IRRIGATION AND DEBRIDEMENT ABSCESS N/A 04/15/2017   Procedure: DEBRIDEMENT SCROTAL WOUND AND DRESSING CHANGE;  Surgeon: Ceasar Mons, MD;  [facility];  Service: Urology;  Laterality: N/A;   IRRIGATION AND DEBRIDEMENT ABSCESS N/A 04/17/2017   Procedure: IRRIGATION AND DEBRIDEMENT ABSCESS;  Surgeon: Ceasar Mons, MD;  [facility];  Service: Urology;  Laterality: N/A;  RM 3   LAPAROSCOPIC GASTRIC BANDING  2006   has been removed   OSTECTOMY Right 04/08/2017   Procedure: OSTECTOMY RIGHT GREAT TOE;  Surgeon: Caprice Beaver, DPM;  [facility];  Service: Podiatry;  Laterality: Right;   PORT-A-CATH REMOVAL Left 12/07/2012   Procedure: REMOVAL PORT-A-CATH;  Surgeon: Scherry Ran, MD;  Location: [facility];  Service: General;  Laterality: Left;   PORT-A-CATH REMOVAL N/A 10/02/2017   Procedure: MINOR REMOVAL PORT-A-CATH AT BEDSIDE;  Surgeon: Donnie Mesa, MD;  Location: [facility];  Service: General;  Laterality: N/A;   PORTACATH PLACEMENT  07/07/2011   Procedure: INSERTION PORT-A-CATH;  Surgeon: Scherry Ran, MD;  [facility];  Service: General;  Laterality: N/A;   PORTACATH PLACEMENT N/A 12/07/2012   Procedure: INSERTION PORT-A-CATH;  Surgeon: Scherry Ran, MD;  Location: [facility];  Service: General;  Laterality: N/A;  Attempted portacath placement on left and right side   URETEROSCOPY Right 12/16/2017    Procedure: DIAGNOSTIC RIGHT URETEROSCOPY;  Surgeon: Cleon Gustin, MD;  [facility]: [facility];  Service: Urology;  Laterality: Right;   WOUND DEBRIDEMENT Right 04/08/2017   Procedure: EXCISION ULCERATION RIGHT GREAT TOE;  Surgeon: Caprice Beaver, DPM;  Location: [facility];  Service: Podiatry;  Laterality: Right;   WRIST SURGERY     Left; removal of bone fragment    There were no vitals filed for this visit.               Wound Therapy - 05/31/21 0001     Subjective Pt arrives with compression garments on.    Patient and Family Stated Goals wound to heal    Date of Onset 02/12/21    Prior Treatments compression, neosporin and other topicals    Pain Scale 0-10    Pain Score 0-No pain    Evaluation and Treatment Procedures Explained to Patient/Family Yes    Evaluation and Treatment  Procedures agreed to    Wound Properties Date First Assessed: 03/14/21 Time First Assessed: 0745 Wound Type: Skin tear Location: Pretibial Location Orientation: Distal;Right;Lateral Wound Description (Comments): lateral shin wound Present on Admission: Yes Final Assessment Date: 05/31/21 Final Assessment Time: 0930   Selective Debridement - Location none needed    Wound Therapy - Clinical Statement After cleansing RT LE still has small opening of .2x.2 cm which is 100% granulated.  Therapist measured two wounds on Lt shin the most superior was 2x 1.8 cm with no depth and 100% granulated; the inferior is 1.5 x.8 no depth 100% granulated.  Explained to pt that this is not skilled care as there is no debridement needed.  Explained that pt needs to cleanse and moisturize LE daily, bandage wound then apply compression garment daily and use pump daily and the wounds on his Lt LE should heal but they are not in need of skilled care.  Pt called wife and therapist explained this again to pt's wife.  Therapist also recommended pt wife donning pt compresion garmnet on with gloves as therapist noted several nail marking  which pt states came from pt wife hands slipping as she put the compression garments on.    Wound Therapy - Functional Problem List walking    Factors Delaying/Impairing Wound Healing Diabetes Mellitus;Multiple medical problems    Hydrotherapy Plan Debridement;Dressing change;Patient/family education;Pulsatile lavage with suction    Wound Therapy - Frequency 2X / week    Wound Therapy - Current Recommendations PT    Wound Plan DC wound care to Rt.  Referral sent for Lt LE.    Dressing  --    Dressing xeroform,4x4 and gauze applied to wounds followed by donning of compression garment.                     PT Education - 05/31/21 1001     Education Details Instructed in proper care for LE to avoid wounds.  Daily cleansing and moisturizing, daily wearing of compression garments and daily pumping for and hour with compression pump.  Pt given prescription for compression garment for 20-30 vx 30-40 mmHG.              PT Short Term Goals - 04/16/21 1525       PT SHORT TERM GOAL #1   Title Patient wound sizes to decrease 50% to decrease risk of infection.    Baseline 04/16/21:  1 wound remains, increased in size with culture complete this session    Status Partially Met      PT SHORT TERM GOAL #2   Title Patient to experience pain no more than 1/10 in wound area in order to show general improvement of condition     Status Achieved               PT Long Term Goals - 04/16/21 1526       PT LONG TERM GOAL #1   Title Patient to demonstrate full wound healing in order to show resolution of condition     Status On-going      PT LONG TERM GOAL #2   Title Patient and family to be independent in skin care strategies and edema control in order to assist in prevention of other wound formation     Baseline 11/15: Pt verbalized need for return to compression garment once wound is healed    Status Partially Met  Patient will benefit from skilled  therapeutic intervention in order to improve the following deficits and impairments:     Visit Diagnosis: No diagnosis found.     Problem List Patient Active Problem List   Diagnosis Date Noted   Multiple myeloma in remission ([place]) 11/10/2017   CKD (chronic kidney disease) stage 3, GFR 30-59 ml/min (HCC) 66/11/43   Chronic systolic CHF (congestive heart failure) ([place]) 11/10/2017   MRSA bacteremia 10/03/2017   Pressure injury of skin 08/04/2017   Fournier's gangrene of scrotum 04/14/2017   Hypogammaglobulinemia ([place]) 03/09/2016   Diaphragmatic hernia without obstruction 99/77/4142   Chronic systolic heart failure ([place]) 12/19/2013   Anemia, normocytic normochromic 10/09/2012   Chronic pancreatitis ([place]) 10/07/2012   Atrial flutter ([place]) 09/17/2012   DDD (degenerative disc disease), cervical 03/18/2012   Arteriosclerotic cardiovascular disease (ASCVD)    Hypertension    Hyperlipidemia    Multiple myeloma ([place]) 07/01/2011   Morbid obesity ([place]) 04/29/2010   Insulin dependent diabetes mellitus 11/15/2008   OSA on CPAP 11/15/2008   Rayetta Humphrey, PT CLT [phone]  05/31/2021, 10:02 AM  [facility] [facility] [address] [place], [place], [address] Phone: [phone]   Fax:  [phone]  Name: JUANANGEL SODERHOLM MRN: [id number] Date of Birth: [birth year]

## 2021-06-04 ENCOUNTER — Encounter (HOSPITAL_COMMUNITY)
Admission: RE | Admit: 2021-06-04 | Discharge: 2021-06-04 | Disposition: A | Discharge status: Home / Self Care | Payer: BC Managed Care – PPO | Source: Ambulatory Visit | Attending: Nephrology | Admitting: Nephrology

## 2021-06-04 ENCOUNTER — Ambulatory Visit (HOSPITAL_COMMUNITY): Payer: BC Managed Care – PPO | Admitting: Physical Therapy

## 2021-06-04 ENCOUNTER — Encounter (HOSPITAL_COMMUNITY): Payer: Self-pay | Admitting: Hematology and Oncology

## 2021-06-04 ENCOUNTER — Encounter (HOSPITAL_COMMUNITY): Payer: Self-pay

## 2021-06-04 DIAGNOSIS — D631 Anemia in chronic kidney disease: Secondary | ICD-10-CM | POA: Diagnosis not present

## 2021-06-04 DIAGNOSIS — N184 Chronic kidney disease, stage 4 (severe): Secondary | ICD-10-CM | POA: Diagnosis present

## 2021-06-04 LAB — POCT HEMOGLOBIN-HEMACUE: Hemoglobin: 10.6 g/dL — ABNORMAL LOW (ref 13.0–17.0)

## 2021-06-04 NOTE — Progress Notes (Signed)
Arrived today for hemacue, Hgb 10.6 no injection required today. Appointment made for a 2 week recheck.      Latest Reference Range & Units 06/04/21   08:30  Hemoglobin 13.0 - 17.0 g/dL 10.6 (L)  (L): Data is abnormally low

## 2021-06-06 ENCOUNTER — Ambulatory Visit (HOSPITAL_COMMUNITY): Payer: BC Managed Care – PPO | Admitting: Physical Therapy

## 2021-06-12 ENCOUNTER — Ambulatory Visit (HOSPITAL_COMMUNITY): Payer: BC Managed Care – PPO | Admitting: Physical Therapy

## 2021-06-14 ENCOUNTER — Ambulatory Visit (HOSPITAL_COMMUNITY): Payer: BC Managed Care – PPO | Admitting: Physical Therapy

## 2021-06-17 ENCOUNTER — Ambulatory Visit (HOSPITAL_COMMUNITY): Payer: BC Managed Care – PPO | Admitting: Physical Therapy

## 2021-06-18 ENCOUNTER — Encounter (HOSPITAL_COMMUNITY): Admission: RE | Admit: 2021-06-18 | Payer: BC Managed Care – PPO | Source: Ambulatory Visit

## 2021-06-20 ENCOUNTER — Ambulatory Visit (HOSPITAL_COMMUNITY): Payer: BC Managed Care – PPO | Admitting: Physical Therapy

## 2021-06-21 ENCOUNTER — Encounter: Payer: Self-pay | Admitting: Cardiology

## 2021-06-21 ENCOUNTER — Ambulatory Visit: Payer: BC Managed Care – PPO | Admitting: Cardiology

## 2021-06-21 ENCOUNTER — Other Ambulatory Visit: Payer: Self-pay

## 2021-06-21 VITALS — BP 150/80 | HR 80 | Ht 74.0 in | Wt 395.0 lb

## 2021-06-21 DIAGNOSIS — I5023 Acute on chronic systolic (congestive) heart failure: Secondary | ICD-10-CM | POA: Diagnosis not present

## 2021-06-21 DIAGNOSIS — I4892 Unspecified atrial flutter: Secondary | ICD-10-CM | POA: Diagnosis not present

## 2021-06-21 DIAGNOSIS — I10 Essential (primary) hypertension: Secondary | ICD-10-CM

## 2021-06-21 DIAGNOSIS — Z79899 Other long term (current) drug therapy: Secondary | ICD-10-CM

## 2021-06-21 MED ORDER — RIVAROXABAN 20 MG PO TABS: 20.0000 mg | ORAL_TABLET | Freq: Every day | ORAL | 11 refills | Status: DC

## 2021-06-21 MED ORDER — ISOSORBIDE MONONITRATE ER 30 MG PO TB24: 30.0000 mg | ORAL_TABLET | Freq: Every day | ORAL | 3 refills | Status: DC

## 2021-06-21 MED ORDER — AMLODIPINE BESYLATE 5 MG PO TABS: 5.0000 mg | ORAL_TABLET | Freq: Every day | ORAL | 3 refills | Status: DC

## 2021-06-21 NOTE — Patient Instructions (Signed)
Medication Instructions:   INCREASE Imdur to 30 mg daily   START Xarelto 20 mg daily   START Amlodipine 5 mg daily  Labwork:  BMET,BNP,Magnesium  Testing/Procedures: None   Follow-Up: 4 months  Any Other Special Instructions Will Be Listed Below (If Applicable).  If you need a refill on your cardiac medications before your next appointment, please call your pharmacy.

## 2021-06-21 NOTE — Progress Notes (Signed)
Clinical Summary Mr. Nohr is a 61 y.o.male seen today for follow up of the following medical problems.   1. CAD - prior stents as reported below -no recent chest pains.    2. Chronic systolic HF - 6226 echo LVEF 35-40% - 2015 LVEF 25-30% 2017 LVEF 35-40%   - 07/2019 ehco LVEF 25-30%, grade II diastolic dysfxn. -Limited ACE inhibitor's or angiotensin receptor blockers due to advanced chronic kidney disease - off beta blocker due to bradycardia from prior Dr Raliegh Ip notes.      - weight 385 12/2019, up to 399 at 06/2020 appt, today 404 lbs - last visit we started hydral/nitrates - at Jan 27 appt with neprhology had increased swelling, torsemide increased to 70m alternating with 269m Sensitive to diuretics given intermittent diarrhea - since change swelling is stable. Home weights 399 lbs last by home - mild swelling in legs, can feel abdominal distension and some SOB. Chronically sleeps at angle - chronic diarrhea that can exacebate at times.    - home weights normal range typically 386 to 392 lbs - taking torsemide 4055mlternating with 40m56m home bp's usually 130s/70s-80s    10/2020 echo LVEF 30-35%, grade II dd - no recent edema, no SOB/DOE - discussing possible ICD with Dr TaylLovena Leere are some concerns about potential infection risk.     - Jun 05 2021 appt with renal diuretics were increased from toresmide 40mg89mly to 40mg 50m- he was noted to have 18 lbs weight gain from 382 to 400 lbs.  - home weight 401 -->391 lbs since diuretic changes - reports swelling improved     2.Anemia - he is on epogen per renal - last Hgb up to 10  3. Aflutter CrCl is 76 based on calculations, has been on xarelto 20 - occasionally can hear his heart beat, no specific palpitations  -had been of xarelto due to severe anemia. Had not had active bleeding, appears to be secondary to his severe kidney disease      4. CKD IV - followed by Dr BhutanTheador Hawthorne. OSA - compliant  with cpap    6. Multiple myeloma - being monitored      7. 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 [employer] [place]   Past Medical History:  Diagnosis Date   Abscess 04/2017   Scrotal   Anemia    Arteriosclerotic cardiovascular disease (ASCVD)    MI-2000s; stent to the proximal LAD and diagonal in 2001; stress nuclear in 2008-impaired exercise capacity, left ventricular dilatation, moderately to severely depressed EF, apical, inferior and anteroseptal scar   Arthritis    Atrial flutter (HCC)    Bence-Jones proteinuria 05/05/2011   Cellulitis of leg    both legs   Chronic diarrhea    Chronic kidney disease, stage 3, mod decreased GFR (HCC)    Creatinine of 1.84 in 06/2011 and 1.5 in 07/2011   Diabetes mellitus    Insulin   Dysrhythmia    AFlutter   GERD (gastroesophageal reflux disease)    Gout    Hyperlipidemia    Hypertension    Injection site reaction    Multiple myeloma 07/01/2011   Myocardial infarction (HCC) [place]   Obesity    Pedal edema    Venous insufficiency   Sleep apnea  uses cpap   Ulcer      Allergies  Allergen Reactions   Beef-Derived Products Diarrhea   Pork-Derived Products Diarrhea   Other     Band aids      Current Outpatient Medications  Medication Sig Dispense Refill   acyclovir (ZOVIRAX) 400 MG tablet Take 1 tablet (400 mg total) by mouth every morning. 30 tablet 5   allopurinol (ZYLOPRIM) 300 MG tablet TAKE ONE TABLET BY MOUTH ONCE DAILY. 30 tablet 2   atorvastatin (LIPITOR) 80 MG tablet TAKE ONE TABLET BY MOUTH AT BEDTIME. 90 tablet 3   Cholecalciferol (D2000 ULTRA STRENGTH) 50 MCG (2000 UT) CAPS Take by mouth.     Ciclopirox 0.77 % gel SMARTSIG:Sparingly Topical Twice Daily     CONTOUR NEXT TEST test strip      cyclobenzaprine (FLEXERIL) 5 MG tablet Take by mouth.     dicyclomine (BENTYL) 20 MG tablet TAKE 1  TABLET BY MOUTH BEFORE MEALS 3 TIMES DAILY. 90 tablet 2   Eluxadoline 100 MG TABS Take 100 mg by mouth 2 (two) times daily with a meal.     epoetin alfa (EPOGEN) 3000 UNIT/ML injection Inject 3,000 Units into the skin once.     ferrous sulfate 325 (65 FE) MG EC tablet Take 45 mg by mouth. Every other day     gabapentin (NEURONTIN) 600 MG tablet TAKE 1 TABLET BY MOUTH ONCE A DAY. 30 tablet 0   hydrALAZINE (APRESOLINE) 100 MG tablet Take 1 tablet (100 mg total) by mouth 3 (three) times daily. 270 tablet 3   insulin aspart (NOVOLOG) 100 UNIT/ML injection Inject into the skin. Sliding scale over 150= 1 unit, 200= 2 units,250= 3 units     insulin detemir (LEVEMIR) 100 UNIT/ML injection Inject 0.1 mLs (10 Units total) into the skin every morning. 10 mL 11   INSULIN SYRINGE .5CC/29G 29G X 1/2" 0.5 ML MISC      isosorbide mononitrate (IMDUR) 30 MG 24 hr tablet TAKE (1/2) TABLET BY MOUTH DAILY 45 tablet 0   loratadine (CLARITIN) 10 MG tablet Take 10 mg by mouth every morning.     Magnesium Cl-Calcium Carbonate (SLOW-MAG PO) Take 1 tablet by mouth every morning.     Microlet Lancets MISC      Multiple Vitamins-Minerals (MULTIVITAMINS THER. W/MINERALS) TABS Take 1 tablet by mouth daily.     mupirocin ointment (BACTROBAN) 2 % Apply topically 2 (two) times daily.     nitroGLYCERIN (NITROSTAT) 0.4 MG SL tablet DISSOLVE 1 TABLET UNDER TONGUE EVERY 5 MINUTES UP TO 15 MIN FOR CHESTPAIN. IF NO RELIEF CALL 911. 25 tablet 0   pantoprazole (PROTONIX) 40 MG tablet Take 40 mg by mouth daily.     potassium chloride SA (KLOR-CON) 20 MEQ tablet Take 40 mEq by mouth daily.     sodium bicarbonate 650 MG tablet Take 650 mg by mouth 3 (three) times daily.     SURE COMFORT PEN NEEDLES 29G X 12.7MM MISC USE AS DIRECTED FOURCTIMES DAILY.     torsemide (DEMADEX) 20 MG tablet Take 40 mg by mouth daily.     No current facility-administered medications for this visit.   Facility-Administered Medications Ordered in Other Visits   Medication Dose Route Frequency Provider Last Rate Last Admin   sodium chloride 0.9 % injection 10 mL  10 mL Intravenous Once Farrel Gobble, MD         Past Surgical History:  Procedure Laterality Date   ABSCESS DRAINAGE     Scrotal  BIOPSY  01/02/2012   Procedure: BIOPSY;  Surgeon: Rogene Houston, MD;  [facility];  Service: Endoscopy;  Laterality: N/A;   BONE MARROW BIOPSY  05/13/11   CARDIAC CATHETERIZATION     cardiac stent   CARDIOVERSION N/A 10/13/2012   Procedure: CARDIOVERSION;  Surgeon: Yehuda Savannah, MD;  Location: [facility];  Service: Cardiovascular;  Laterality: N/A;   CATARACT EXTRACTION W/PHACO Left 02/13/2014   Procedure: CATARACT EXTRACTION PHACO AND INTRAOCULAR LENS PLACEMENT ([place]);  Surgeon: Tonny , MD;  Location: [facility];  Service: Ophthalmology;  Laterality: Left;  CDE:  7.67   CATARACT EXTRACTION W/PHACO Right 03/02/2014   Procedure: CATARACT EXTRACTION PHACO AND INTRAOCULAR LENS PLACEMENT RIGHT EYE CDE=16.81;  Surgeon: Tonny , MD;  Location: [facility];  Service: Ophthalmology;  Laterality: Right;   COLONOSCOPY  11/28/2011   Procedure: COLONOSCOPY;  Surgeon: Rogene Houston, MD;  [facility];  Service: Endoscopy;  Laterality: N/A;  [place] Right 12/16/2017   Procedure: CYSTOSCOPY WITH RIGHT RETROGRADE PYELOGRAM AND RIGHT URETERAL STENT REMOVAL;  Surgeon: Cleon Gustin, MD;  Location: [facility];  Service: Urology;  Laterality: Right;   CYSTOSCOPY W/ URETERAL STENT PLACEMENT Right 07/31/2017   Procedure: CYSTOSCOPY WITH RETROGRADE PYELOGRAM/URETERAL STENT PLACEMENT;  Surgeon: Cleon Gustin, MD;  [facility]: [facility];  Service: Urology;  Laterality: Right;   ESOPHAGOGASTRODUODENOSCOPY  01/02/2012   Procedure: ESOPHAGOGASTRODUODENOSCOPY (EGD);  Surgeon: Rogene Houston, MD;  [facility];  Service: Endoscopy;  Laterality: N/A;  100   ESOPHAGOGASTRODUODENOSCOPY N/A 09/20/2012   Procedure:  ESOPHAGOGASTRODUODENOSCOPY (EGD);  Surgeon: Rogene Houston, MD;  [facility];  Service: Endoscopy;  Laterality: N/A;   EUS N/A 10/07/2012   Procedure: UPPER ENDOSCOPIC ULTRASOUND (EUS) LINEAR;  Surgeon: Milus Banister, MD;  [facility];  Service: Endoscopy;  Laterality: N/A;   INCISION AND DRAINAGE ABSCESS N/A 04/14/2017   Procedure: INCISION AND DRAINAGE ABSCESS;  Surgeon: Ceasar Mons, MD;  [facility];  Service: Urology;  Laterality: N/A;   INCISION AND DRAINAGE ABSCESS ANAL     IRRIGATION AND DEBRIDEMENT ABSCESS N/A 04/15/2017   Procedure: DEBRIDEMENT SCROTAL WOUND AND DRESSING CHANGE;  Surgeon: Ceasar Mons, MD;  [facility];  Service: Urology;  Laterality: N/A;   IRRIGATION AND DEBRIDEMENT ABSCESS N/A 04/17/2017   Procedure: IRRIGATION AND DEBRIDEMENT ABSCESS;  Surgeon: Ceasar Mons, MD;  [facility];  Service: Urology;  Laterality: N/A;  RM 3   LAPAROSCOPIC GASTRIC BANDING  2006   has been removed   OSTECTOMY Right 04/08/2017   Procedure: OSTECTOMY RIGHT GREAT TOE;  Surgeon: Caprice Beaver, DPM;  [facility];  Service: Podiatry;  Laterality: Right;   PORT-A-CATH REMOVAL Left 12/07/2012   Procedure: REMOVAL PORT-A-CATH;  Surgeon: Scherry Ran, MD;  [facility]: [facility];  Service: General;  Laterality: Left;   PORT-A-CATH REMOVAL N/A 10/02/2017   Procedure: MINOR REMOVAL PORT-A-CATH AT BEDSIDE;  Surgeon: Donnie Mesa, MD;  Location: [facility];  Service: General;  Laterality: N/A;   PORTACATH PLACEMENT  07/07/2011   Procedure: INSERTION PORT-A-CATH;  Surgeon: Scherry Ran, MD;  [facility];  Service: General;  Laterality: N/A;   PORTACATH PLACEMENT N/A 12/07/2012   Procedure: INSERTION PORT-A-CATH;  Surgeon: Scherry Ran, MD;  [facility];  Service: General;  Laterality: N/A;  Attempted portacath placement on left and right side   URETEROSCOPY Right 12/16/2017   Procedure: DIAGNOSTIC RIGHT  URETEROSCOPY;  Surgeon: Cleon Gustin, MD;  [facility];  Service: Urology;  Laterality: Right;   WOUND DEBRIDEMENT Right 04/08/2017   Procedure: EXCISION ULCERATION RIGHT GREAT TOE;  Surgeon: Caprice Beaver, DPM;  Location[facility];  Service: Podiatry;  Laterality: Right;   WRIST SURGERY     Left; removal of bone fragment     Allergies  Allergen Reactions   Beef-Derived Products Diarrhea   Pork-Derived Products Diarrhea   Other     Band aids       Family History  Problem Relation Age of Onset   Heart disease Mother    Cancer Mother    Diabetes Father    Arthritis Other    Anesthesia problems Neg Hx    Hypotension Neg Hx    Malignant hyperthermia Neg Hx    Pseudochol deficiency Neg Hx      Social History Mr. Kilty reports that he quit smoking about 20 years ago. His smoking use included cigarettes and cigars. He has a 0.25 pack-year smoking history. He has never used smokeless tobacco. Mr. Pargas reports no history of alcohol use.   Review of Systems CONSTITUTIONAL: No weight loss, fever, chills, weakness or fatigue.  HEENT: Eyes: No visual loss, blurred vision, double vision or yellow sclerae.No hearing loss, sneezing, congestion, runny nose or sore throat.  SKIN: No rash or itching.  CARDIOVASCULAR: per hpi RESPIRATORY: No shortness of breath, cough or sputum.  GASTROINTESTINAL: No anorexia, nausea, vomiting or diarrhea. No abdominal pain or blood.  GENITOURINARY: No burning on urination, no polyuria NEUROLOGICAL: No headache, dizziness, syncope, paralysis, ataxia, numbness or tingling in the extremities. No change in bowel or bladder control.  MUSCULOSKELETAL: No muscle, back pain, joint pain or stiffness.  LYMPHATICS: No enlarged nodes. No history of splenectomy.  PSYCHIATRIC: No history of depression or anxiety.  ENDOCRINOLOGIC: No reports of sweating, cold or heat intolerance. No polyuria or polydipsia.  Marland Kitchen   Physical Examination Today's  Vitals   06/21/21 1611  BP: (!) 196/98  Pulse: 80  SpO2: 96%  Weight: (!) 395 lb (179.2 kg)  Height: _0  (1.88 m)   Body mass index is 50.71 kg/m.  Gen: resting comfortably, no acute distress HEENT: no scleral icterus, pupils equal round and reactive, no palptable cervical adenopathy,  CV: RRR, no m/r/g, no jvd Resp: Clear to auscultation bilaterally GI: abdomen is soft, non-tender, non-distended, normal bowel sounds, no hepatosplenomegaly MSK: extremities are warm, no edema.  Skin: warm, no rash Neuro:  no focal deficits Psych: appropriate affect   Diagnostic Studies  10/2020 echo IMPRESSIONS     1. Left ventricular ejection fraction, by estimation, is approximately 30  to 35%. The left ventricle has moderately decreased function. The left  ventricle demonstrates regional wall motion abnormalities (see scoring  diagram/findings for description).  The left ventricular internal cavity size was moderately dilated. There is  mild left ventricular hypertrophy. Left ventricular diastolic parameters  are consistent with Grade II diastolic dysfunction (pseudonormalization).   2. Right ventricular systolic function is normal. The right ventricular  size is normal. Tricuspid regurgitation signal is inadequate for assessing  PA pressure.   3. Left atrial size was mildly dilated.   4. Right atrial size was mildly dilated.   5. The mitral valve is grossly normal. Mild mitral valve regurgitation.   6. The aortic valve is tricuspid. Aortic valve regurgitation is not  visualized. There is a rounded echodensity seen at the aortic valve  coaptation point in the parasternal long axis  view only. Although cannot  exclude vegetation, this could be visualized   calcified left coronary leaflet tip based on other views. If endocarditis  is suspected clinically, would suggest TEE.   7. The inferior vena cava is dilated in size with >50% respiratory  variability, suggesting right atrial  pressure of 8 mmHg.      Assessment and Plan  1. Acute on chronic systolic HF - several year history of chronic systolic HF, ICM - medical therapy limited. Bradycardia on beta blockers now off. Significant renal dysfunction, on just low dose ACEI per renal. Not a candidate for farxiga given GFR - diuretics have been difficult, intermittent chronic diarrhea with fluid losses that has led to AKI in the past on diuretics. Currently on torsemide 57m alt days with 280m-recent significant weight gain and edema improving since nephrologhy increased torsemide to 2017mid. Continue diuretic, will recheck bmet/mg/bnp.  - increase imdur to 36m63mily, on hydral 100mg55m.      2. Aflutter - not on av nodal agents due to prior bradycardia - given degree of prior anemia had been off xarelto -Hgb much improved, restart xarelto 20mg 85my.     3. HTN - above goal, will start norvasc 5mg da36m.       JonathaArnoldo Lenis

## 2021-06-24 ENCOUNTER — Ambulatory Visit (HOSPITAL_COMMUNITY): Payer: BC Managed Care – PPO | Admitting: Physical Therapy

## 2021-06-27 ENCOUNTER — Encounter (HOSPITAL_COMMUNITY)
Admission: RE | Admit: 2021-06-27 | Discharge: 2021-06-27 | Disposition: A | Discharge status: Home / Self Care | Payer: BC Managed Care – PPO | Source: Ambulatory Visit | Attending: Nephrology | Admitting: Nephrology

## 2021-06-27 ENCOUNTER — Other Ambulatory Visit: Payer: Self-pay

## 2021-06-27 ENCOUNTER — Ambulatory Visit (HOSPITAL_COMMUNITY): Payer: BC Managed Care – PPO | Admitting: Physical Therapy

## 2021-06-27 DIAGNOSIS — N184 Chronic kidney disease, stage 4 (severe): Secondary | ICD-10-CM | POA: Diagnosis not present

## 2021-06-27 LAB — POCT HEMOGLOBIN-HEMACUE: Hemoglobin: 10.8 g/dL — ABNORMAL LOW (ref 13.0–17.0)

## 2021-06-27 MED ORDER — EPOETIN ALFA-EPBX 10000 UNIT/ML IJ SOLN: 10000.0000 [IU] | Freq: Once | INTRAMUSCULAR | Status: DC

## 2021-07-01 ENCOUNTER — Ambulatory Visit (HOSPITAL_COMMUNITY): Payer: BC Managed Care – PPO | Admitting: Physical Therapy

## 2021-07-04 ENCOUNTER — Ambulatory Visit (HOSPITAL_COMMUNITY): Payer: BC Managed Care – PPO | Admitting: Physical Therapy

## 2021-07-09 LAB — BASIC METABOLIC PANEL
BUN/Creatinine Ratio: 18 (ref 10–24)
BUN: 49 mg/dL — ABNORMAL HIGH (ref 8–27)
CO2: 21 mmol/L (ref 20–29)
Calcium: 8.8 mg/dL (ref 8.6–10.2)
Chloride: 111 mmol/L — ABNORMAL HIGH (ref 96–106)
Creatinine, Ser: 2.8 mg/dL — ABNORMAL HIGH (ref 0.76–1.27)
Glucose: 148 mg/dL — ABNORMAL HIGH (ref 70–99)
Potassium: 5 mmol/L (ref 3.5–5.2)
Sodium: 149 mmol/L — ABNORMAL HIGH (ref 134–144)
eGFR: 25 mL/min/{1.73_m2} — ABNORMAL LOW (ref >59)

## 2021-07-09 LAB — BRAIN NATRIURETIC PEPTIDE: BNP: 472.3 pg/mL — ABNORMAL HIGH (ref 0.0–100.0)

## 2021-07-09 LAB — MAGNESIUM: Magnesium: 2.1 mg/dL (ref 1.6–2.3)

## 2021-07-11 ENCOUNTER — Encounter (HOSPITAL_COMMUNITY)
Admission: RE | Admit: 2021-07-11 | Discharge: 2021-07-11 | Disposition: A | Discharge status: Home / Self Care | Payer: BC Managed Care – PPO | Source: Ambulatory Visit | Attending: Nephrology | Admitting: Nephrology

## 2021-07-11 DIAGNOSIS — N184 Chronic kidney disease, stage 4 (severe): Secondary | ICD-10-CM | POA: Diagnosis not present

## 2021-07-11 DIAGNOSIS — D631 Anemia in chronic kidney disease: Secondary | ICD-10-CM | POA: Insufficient documentation

## 2021-07-11 LAB — POCT HEMOGLOBIN-HEMACUE: Hemoglobin: 10.3 g/dL — ABNORMAL LOW (ref 13.0–17.0)

## 2021-07-11 MED ORDER — EPOETIN ALFA-EPBX 10000 UNIT/ML IJ SOLN: 10000.0000 [IU] | Freq: Once | INTRAMUSCULAR | Status: DC

## 2021-07-16 ENCOUNTER — Telehealth: Payer: Self-pay

## 2021-07-16 NOTE — Telephone Encounter (Signed)
-----   Message from Arnoldo Lenis, MD sent at 07/14/2021  9:25 AM EST ----- Labs show stable kidney function. Suggest some ongoing fluid that should improve with recent diuretic change by neprhology   Zandra Abts MD

## 2021-07-16 NOTE — Telephone Encounter (Signed)
Patient notified and verbalized understanding. Pt had no questions or concerns at this time. PCP copied.  ?

## 2021-07-18 ENCOUNTER — Other Ambulatory Visit (HOSPITAL_COMMUNITY): Payer: Self-pay | Admitting: *Deleted

## 2021-07-18 DIAGNOSIS — C9001 Multiple myeloma in remission: Secondary | ICD-10-CM

## 2021-07-18 DIAGNOSIS — D649 Anemia, unspecified: Secondary | ICD-10-CM

## 2021-07-22 ENCOUNTER — Ambulatory Visit (HOSPITAL_COMMUNITY): Payer: BC Managed Care – PPO | Admitting: Hematology

## 2021-07-25 ENCOUNTER — Encounter (HOSPITAL_COMMUNITY)
Admission: RE | Admit: 2021-07-25 | Discharge: 2021-07-25 | Disposition: A | Discharge status: Home / Self Care | Payer: BC Managed Care – PPO | Source: Ambulatory Visit | Attending: Nephrology | Admitting: Nephrology

## 2021-07-25 ENCOUNTER — Encounter (HOSPITAL_COMMUNITY): Payer: Self-pay

## 2021-07-25 DIAGNOSIS — N184 Chronic kidney disease, stage 4 (severe): Secondary | ICD-10-CM | POA: Diagnosis not present

## 2021-07-25 LAB — POCT HEMOGLOBIN-HEMACUE: Hemoglobin: 9.8 g/dL — ABNORMAL LOW (ref 13.0–17.0)

## 2021-07-25 MED ORDER — EPOETIN ALFA-EPBX 10000 UNIT/ML IJ SOLN
10000.0000 [IU] | Freq: Once | INTRAMUSCULAR | Status: AC
  Administered 2021-07-25: 10000 [IU] via SUBCUTANEOUS
  Filled 2021-07-25: qty 1

## 2021-07-29 ENCOUNTER — Other Ambulatory Visit: Payer: Self-pay | Admitting: Cardiology

## 2021-08-07 ENCOUNTER — Ambulatory Visit (HOSPITAL_COMMUNITY)
Admission: RE | Admit: 2021-08-07 | Discharge: 2021-08-07 | Disposition: A | Discharge status: Home / Self Care | Payer: BC Managed Care – PPO | Source: Ambulatory Visit | Attending: Nephrology | Admitting: Nephrology

## 2021-08-07 ENCOUNTER — Other Ambulatory Visit (HOSPITAL_COMMUNITY): Payer: Self-pay | Admitting: Nephrology

## 2021-08-07 ENCOUNTER — Inpatient Hospital Stay (HOSPITAL_COMMUNITY): Payer: BC Managed Care – PPO | Attending: Hematology | Admitting: Hematology

## 2021-08-07 ENCOUNTER — Other Ambulatory Visit: Payer: Self-pay

## 2021-08-07 VITALS — BP 125/69 | HR 53 | Temp 98.2°F | Resp 18 | Wt 395.0 lb

## 2021-08-07 DIAGNOSIS — N170 Acute kidney failure with tubular necrosis: Secondary | ICD-10-CM | POA: Insufficient documentation

## 2021-08-07 DIAGNOSIS — D649 Anemia, unspecified: Secondary | ICD-10-CM

## 2021-08-07 DIAGNOSIS — I509 Heart failure, unspecified: Secondary | ICD-10-CM | POA: Diagnosis not present

## 2021-08-07 DIAGNOSIS — I130 Hypertensive heart and chronic kidney disease with heart failure and stage 1 through stage 4 chronic kidney disease, or unspecified chronic kidney disease: Secondary | ICD-10-CM | POA: Diagnosis not present

## 2021-08-07 DIAGNOSIS — C9001 Multiple myeloma in remission: Secondary | ICD-10-CM | POA: Diagnosis not present

## 2021-08-07 NOTE — Patient Instructions (Addendum)
[place] at [facility] ?Discharge Instructions ? ?You were seen and examined today by [name]. He reviewed your most recent labs and your kidney functions are elevated, your hemoglobin has improved slightly. Please keep follow up appointment as scheduled in 4 months.  ? ? ?Thank you for choosing [place] at [facility] to provide your oncology and hematology care.  To afford each patient quality time with our provider, please arrive at least 15 minutes before your scheduled appointment time.  ? ?If you have a lab appointment with the [facility] please come in thru the Main Entrance and check in at the main information desk. ? ?You need to re-schedule your appointment should you arrive 10 or more minutes late.  We strive to give you quality time with our providers, and arriving late affects you and other patients whose appointments are after yours.  Also, if you no show three or more times for appointments you may be dismissed from the clinic at the providers discretion.     ?Again, thank you for choosing [facility].  Our hope is that these requests will decrease the amount of time that you wait before being seen by our physicians.       ?_____________________________________________________________ ? ?Should you have questions after your visit to [facility], please contact our office at [phone] and follow the prompts.  Our office hours are 8:00 a.m. and 4:30 p.m. Monday - Friday.  Please note that voicemails left after 4:00 p.m. may not be returned until the following business day.  We are closed weekends and major holidays.  You do have access to a nurse 24-7, just call the main number to the clinic [phone] and do not press any options, hold on the line and a nurse will answer the phone.   ? ?For prescription refill requests, have your pharmacy contact our office and allow 72 hours.   ? ?Due to Covid, you will need  to wear a mask upon entering the hospital. If you do not have a mask, a mask will be given to you at the Main Entrance upon arrival. For doctor visits, patients may have 1 support person age 65 or older with them. For treatment visits, patients can not have anyone with them due to social distancing guidelines and our immunocompromised population.  ? ?  ?

## 2021-08-07 NOTE — Progress Notes (Signed)
[name], [place] [address]   CLINIC:  Medical Oncology/Hematology  PCP:  [name], [address] / [place] [address] [phone]   REASON FOR VISIT:  Follow-up for multiple myeloma  PRIOR THERAPY: none  NGS Results: not done  CURRENT THERAPY: surveillance  BRIEF ONCOLOGIC HISTORY:  Oncology History   No history exists.    CANCER STAGING:  Cancer Staging  No matching staging information was found for the patient.  INTERVAL HISTORY:  [name], a 61 y.o. male, returns for routine follow-up of his multiple myeloma. [name] was last seen on 03/04/2021.   Today he reports feeling good. He started receiving Retacrit injection on 10/30/2020. As of today he is now taking 40 mg of torsemide in the morning and 20 mg in the evening; previously he was taking 40 mg BID.   REVIEW OF SYSTEMS:  Review of Systems  Constitutional:  Negative for appetite change and fatigue.  Respiratory:  Positive for shortness of breath.   Gastrointestinal:  Positive for constipation and diarrhea.  All other systems reviewed and are negative.  PAST MEDICAL/SURGICAL HISTORY:  Past Medical History:  Diagnosis Date   Abscess 04/2017   Scrotal   Anemia    Arteriosclerotic cardiovascular disease (ASCVD)    MI-2000s; stent to the proximal LAD and diagonal in 2001; stress nuclear in 2008-impaired exercise capacity, left ventricular dilatation, moderately to severely depressed EF, apical, inferior and anteroseptal scar   Arthritis    Atrial flutter (HCC)    Bence-Jones proteinuria 05/05/2011   Cellulitis of leg    both legs   Chronic diarrhea    Chronic kidney disease, stage 3, mod decreased GFR (HCC)    Creatinine of 1.84 in 06/2011 and 1.5 in 07/2011   Diabetes mellitus    Insulin   Dysrhythmia    AFlutter   GERD (gastroesophageal reflux disease)    Gout    Hyperlipidemia    Hypertension    Injection site reaction    Multiple  myeloma 07/01/2011   Myocardial infarction ([facility]) 2000   Obesity    Pedal edema    Venous insufficiency   Sleep apnea    uses cpap   Ulcer    Past Surgical History:  Procedure Laterality Date   ABSCESS DRAINAGE     Scrotal   BIOPSY  01/02/2012   Procedure: BIOPSY;  Surgeon: Rogene Houston, MD;  [facility];  Service: Endoscopy;  Laterality: N/A;   BONE MARROW BIOPSY  05/13/11   CARDIAC CATHETERIZATION     cardiac stent   CARDIOVERSION N/A 10/13/2012   Procedure: CARDIOVERSION;  Surgeon: Yehuda Savannah, MD;  Location: [facility];  Service: Cardiovascular;  Laterality: N/A;   CATARACT EXTRACTION W/PHACO Left 02/13/2014   Procedure: CATARACT EXTRACTION PHACO AND INTRAOCULAR LENS PLACEMENT ([place]);  Surgeon: Tonny Branch, MD;  Location: [facility];  Service: Ophthalmology;  Laterality: Left;  CDE:  7.67   CATARACT EXTRACTION W/PHACO Right 03/02/2014   Procedure: CATARACT EXTRACTION PHACO AND INTRAOCULAR LENS PLACEMENT RIGHT EYE CDE=16.81;  Surgeon: Tonny Branch, MD;  Location: [facility];  Service: Ophthalmology;  Laterality: Right;   COLONOSCOPY  11/28/2011   Procedure: COLONOSCOPY;  Surgeon: Rogene Houston, MD;  [facility];  Service: Endoscopy;  Laterality: N/A;  [place] Right 12/16/2017   Procedure: CYSTOSCOPY WITH RIGHT RETROGRADE PYELOGRAM AND RIGHT URETERAL STENT REMOVAL;  Surgeon: Cleon Gustin,  MD;  Location: [facility];  Service: Urology;  Laterality: Right;   CYSTOSCOPY W/ URETERAL STENT PLACEMENT Right 07/31/2017   Procedure: CYSTOSCOPY WITH RETROGRADE PYELOGRAM/URETERAL STENT PLACEMENT;  Surgeon: Cleon Gustin, MD;  [facility]: [facility];  Service: Urology;  Laterality: Right;   ESOPHAGOGASTRODUODENOSCOPY  01/02/2012   Procedure: ESOPHAGOGASTRODUODENOSCOPY (EGD);  Surgeon: Rogene Houston, MD;  [facility];  Service: Endoscopy;  Laterality: N/A;  100   ESOPHAGOGASTRODUODENOSCOPY N/A 09/20/2012   Procedure: ESOPHAGOGASTRODUODENOSCOPY (EGD);   Surgeon: Rogene Houston, MD;  [facility];  Service: Endoscopy;  Laterality: N/A;   EUS N/A 10/07/2012   Procedure: UPPER ENDOSCOPIC ULTRASOUND (EUS) LINEAR;  Surgeon: Milus Banister, MD;  [facility];  Service: Endoscopy;  Laterality: N/A;   INCISION AND DRAINAGE ABSCESS N/A 04/14/2017   Procedure: INCISION AND DRAINAGE ABSCESS;  Surgeon: Ceasar Mons, MD;  [facility];  Service: Urology;  Laterality: N/A;   INCISION AND DRAINAGE ABSCESS ANAL     IRRIGATION AND DEBRIDEMENT ABSCESS N/A 04/15/2017   Procedure: DEBRIDEMENT SCROTAL WOUND AND DRESSING CHANGE;  Surgeon: Ceasar Mons, MD;  [facility];  Service: Urology;  Laterality: N/A;   IRRIGATION AND DEBRIDEMENT ABSCESS N/A 04/17/2017   Procedure: IRRIGATION AND DEBRIDEMENT ABSCESS;  Surgeon: Ceasar Mons, MD;  [facility];  Service: Urology;  Laterality: N/A;  RM 3   LAPAROSCOPIC GASTRIC BANDING  2006   has been removed   OSTECTOMY Right 04/08/2017   Procedure: OSTECTOMY RIGHT GREAT TOE;  Surgeon: Caprice Beaver, DPM;  [facility];  Service: Podiatry;  Laterality: Right;   PORT-A-CATH REMOVAL Left 12/07/2012   Procedure: REMOVAL PORT-A-CATH;  Surgeon: Scherry Ran, MD;  Location: [facility];  Service: General;  Laterality: Left;   PORT-A-CATH REMOVAL N/A 10/02/2017   Procedure: MINOR REMOVAL PORT-A-CATH AT BEDSIDE;  Surgeon: Donnie Mesa, MD;  Location: [facility];  Service: General;  Laterality: N/A;   PORTACATH PLACEMENT  07/07/2011   Procedure: INSERTION PORT-A-CATH;  Surgeon: Scherry Ran, MD;  [facility];  Service: General;  Laterality: N/A;   PORTACATH PLACEMENT N/A 12/07/2012   Procedure: INSERTION PORT-A-CATH;  Surgeon: Scherry Ran, MD;  Location: [facility];  Service: General;  Laterality: N/A;  Attempted portacath placement on left and right side   URETEROSCOPY Right 12/16/2017   Procedure: DIAGNOSTIC RIGHT URETEROSCOPY;  Surgeon: Cleon Gustin, MD;  [facility];  Service: Urology;  Laterality: Right;   WOUND DEBRIDEMENT Right 04/08/2017   Procedure: EXCISION ULCERATION RIGHT GREAT TOE;  Surgeon: Caprice Beaver, DPM;  Location: [facility];  Service: Podiatry;  Laterality: Right;   WRIST SURGERY     Left; removal of bone fragment    SOCIAL HISTORY:  Social History   Socioeconomic History   Marital status: Married    Spouse name: Not on file   Number of children: Not on file   Years of education: 16   Highest education level: Not on file  Occupational History   Occupation: Nurse, learning disability   Occupation: Optometrist: [employer]  Tobacco Use   Smoking status: Former    Packs/day: 0.25    Years: 1.00    Pack years: 0.25    Types: Cigarettes, Cigars    Quit date: 05/17/2001    Years since quitting: 20.2   Smokeless tobacco: Never  Vaping Use   Vaping Use: Never used  Substance and Sexual Activity   Alcohol use: No  Alcohol/week: 0.0 standard drinks   Drug use: No   Sexual activity: Yes    Birth control/protection: None  Other Topics Concern   Not on file  Social History Narrative   Not on file   Social Determinants of Health   Financial Resource Strain: Not on file  Food Insecurity: Not on file  Transportation Needs: Not on file  Physical Activity: Not on file  Stress: Not on file  Social Connections: Not on file  Intimate Partner Violence: Not on file    FAMILY HISTORY:  Family History  Problem Relation Age of Onset   Heart disease Mother    Cancer Mother    Diabetes Father    Arthritis Other    Anesthesia problems Neg Hx    Hypotension Neg Hx    Malignant hyperthermia Neg Hx    Pseudochol deficiency Neg Hx     CURRENT MEDICATIONS:  Current Outpatient Medications  Medication Sig Dispense Refill   acyclovir (ZOVIRAX) 400 MG tablet Take 1 tablet (400 mg total) by mouth every morning. 30 tablet 5   allopurinol (ZYLOPRIM) 300 MG tablet TAKE ONE TABLET  BY MOUTH ONCE DAILY. 30 tablet 2   amLODipine (NORVASC) 5 MG tablet Take 1 tablet (5 mg total) by mouth daily. 90 tablet 3   atorvastatin (LIPITOR) 80 MG tablet TAKE ONE TABLET BY MOUTH AT BEDTIME. 90 tablet 3   Blood Glucose Monitoring Suppl (ACCU-CHEK GUIDE) w/Device KIT USE TO CHECK BLOOD SUGARSITWICE DAILY.     calcitRIOL (ROCALTROL) 0.25 MCG capsule Take 0.25 mcg by mouth 3 (three) times a week.     Cholecalciferol (D2000 ULTRA STRENGTH) 50 MCG (2000 UT) CAPS Take by mouth.     Ciclopirox 0.77 % gel SMARTSIG:Sparingly Topical Twice Daily     ciprofloxacin (CIPRO) 500 MG tablet Take 500 mg by mouth 2 (two) times daily.     CONTOUR NEXT TEST test strip      CREON 36000-114000 units CPEP capsule Take 36,000 Units by mouth 3 (three) times daily as needed.     dicyclomine (BENTYL) 20 MG tablet TAKE 1 TABLET BY MOUTH BEFORE MEALS 3 TIMES DAILY. 90 tablet 2   Eluxadoline 100 MG TABS Take 100 mg by mouth 2 (two) times daily with a meal.     epoetin alfa (EPOGEN) 3000 UNIT/ML injection Inject 3,000 Units into the skin once.     ferrous sulfate 325 (65 FE) MG EC tablet Take 45 mg by mouth. Every other day     gabapentin (NEURONTIN) 600 MG tablet TAKE 1 TABLET BY MOUTH ONCE A DAY. 30 tablet 0   GVOKE PFS 0.5 MG/0.1ML SOSY Inject into the skin.     hydrALAZINE (APRESOLINE) 100 MG tablet Take 1 tablet (100 mg total) by mouth 3 (three) times daily. 270 tablet 3   insulin aspart (NOVOLOG) 100 UNIT/ML injection Inject into the skin. Sliding scale over 150= 1 unit, 200= 2 units,250= 3 units     insulin detemir (LEVEMIR) 100 UNIT/ML injection Inject 0.1 mLs (10 Units total) into the skin every morning. 10 mL 11   INSULIN SYRINGE .5CC/29G 29G X 1/2" 0.5 ML MISC      isosorbide mononitrate (IMDUR) 30 MG 24 hr tablet Take 1 tablet (30 mg total) by mouth daily. 90 tablet 3   loratadine (CLARITIN) 10 MG tablet Take 10 mg by mouth every morning.     Magnesium Cl-Calcium Carbonate (SLOW-MAG PO) Take 1 tablet by  mouth every morning.     Microlet  Lancets MISC      Multiple Vitamins-Minerals (MULTIVITAMINS THER. W/MINERALS) TABS Take 1 tablet by mouth daily.     mupirocin ointment (BACTROBAN) 2 % Apply topically 2 (two) times daily.     nitroGLYCERIN (NITROSTAT) 0.4 MG SL tablet DISSOLVE 1 TABLET UNDER TONGUE EVERY 5 MINUTES UP TO 15 MIN FOR CHESTPAIN. IF NO RELIEF CALL 911. 25 tablet 0   pantoprazole (PROTONIX) 40 MG tablet Take 40 mg by mouth daily.     potassium chloride SA (KLOR-CON) 20 MEQ tablet Take 40 mEq by mouth daily.     rivaroxaban (XARELTO) 20 MG TABS tablet Take 1 tablet (20 mg total) by mouth daily with supper. 30 tablet 11   sodium bicarbonate 650 MG tablet Take 650 mg by mouth 3 (three) times daily.     SURE COMFORT PEN NEEDLES 29G X 12.7MM MISC USE AS DIRECTED FOURCTIMES DAILY.     torsemide (DEMADEX) 20 MG tablet Take 40 mg by mouth daily.     Torsemide 40 MG TABS Take by mouth.     No current facility-administered medications for this visit.   Facility-Administered Medications Ordered in Other Visits  Medication Dose Route Frequency Provider Last Rate Last Admin   sodium chloride 0.9 % injection 10 mL  10 mL Intravenous Once Farrel Gobble, MD        ALLERGIES:  Allergies  Allergen Reactions   Beef-Derived Products Diarrhea   Pork-Derived Products Diarrhea   Other     Band aids     PHYSICAL EXAM:  Performance status (ECOG): 1 - Symptomatic but completely ambulatory  Vitals:   08/07/21 1538  BP: 125/69  Pulse: (!) 53  Resp: 18  Temp: 98.2 F (36.8 C)  SpO2: 98%   Wt Readings from Last 3 Encounters:  08/07/21 (!) 395 lb (179.2 kg)  07/25/21 (!) 395 lb 1 oz (179.2 kg)  06/21/21 (!) 395 lb (179.2 kg)   Physical Exam Vitals reviewed.  Constitutional:      Appearance: Normal appearance. He is obese.  Cardiovascular:     Rate and Rhythm: Normal rate and regular rhythm.     Pulses: Normal pulses.     Heart sounds: Normal heart sounds.  Pulmonary:      Effort: Pulmonary effort is normal.     Breath sounds: Normal breath sounds.  Neurological:     General: No focal deficit present.     Mental Status: He is alert and oriented to person, place, and time.  Psychiatric:        Mood and Affect: Mood normal.        Behavior: Behavior normal.     LABORATORY DATA:  I have reviewed the labs as listed.  CBC Latest Ref Rng & Units 07/25/2021 07/11/2021 06/27/2021  WBC 4.0 - 10.5 K/uL - - -  Hemoglobin 13.0 - 17.0 g/dL 9.8(L) 10.3(L) 10.8(L)  Hematocrit 39.0 - 52.0 % - - -  Platelets 150 - 400 K/uL - - -   CMP Latest Ref Rng & Units 07/08/2021 12/13/2019 06/10/2019  Glucose 70 - 99 mg/dL 148(H) 131(H) 157(H)  BUN 8 - 27 mg/dL 49(H) 75(H) 40(H)  Creatinine 0.76 - 1.27 mg/dL 2.80(H) 2.78(H) 2.26(H)  Sodium 134 - 144 mmol/L 149(H) 140 142  Potassium 3.5 - 5.2 mmol/L 5.0 4.6 4.4  Chloride 96 - 106 mmol/L 111(H) 107 113(H)  CO2 20 - 29 mmol/L _0 Calcium 8.6 - 10.2 mg/dL 8.8 8.8(L) 8.3(L)  Total Protein 6.5 - 8.1 g/dL -  7.3 6.5  Total Bilirubin 0.3 - 1.2 mg/dL - 0.5 0.6  Alkaline Phos 38 - 126 U/L - 115 101  AST 15 - 41 U/L - 31 27  ALT 0 - 44 U/L - 33 27    DIAGNOSTIC IMAGING:  I have independently reviewed the scans and discussed with the patient. No results found.   ASSESSMENT:  1.  Multiple myeloma: -Immunofixation has been negative since 2017.   2.  Macrocytic anemia: -He has mild anemia from CKD and relative iron deficiency. -Last Feraheme was on 12/14/2017. -He is taking iron tablet every other day.   PLAN:  1.  Multiple myeloma: - He does not report any new onset bone pains.  No new infections.  Feet numbness has been stable. - I have reviewed myeloma labs from 07/18/2021 done at [facility].  M spike is not detectable.  Immunofixation was normal.  Kappa light chains are elevated at 148, lambda light chains 90, ratio of 1.65.  LDH was 246.  Creatinine was 3.08 and calcium was 8.0.  Hemoglobin was 10.4.  Platelets are 136. - He has  worsening of his creatinine recently.  He was taking torsemide 60 mg in the morning and 40 mg in the evening, dose decreased to 40 mg twice daily.  This was further decreased to 40 mg in the morning and 20 mg in the evening starting today on 08/07/2021 due to increased creatinine. - Recommend follow-up in 4 months with repeat labs.   2.  Normocytic anemia: - Retacrit 3000 units every 2 weeks started on 10/16/2020. - Last Venofer on 03/25/2021, 400 mg x 1. - He is taking iron tablet every other day. - Reviewed labs from 07/18/2021 from [facility].  Ferritin is 101 with percent saturation 35.  Hemoglobin is 10.4. - I have recommended Venofer 400 mg x 1 dose to improve the efficacy of Retacrit.  We will schedule it next week.   Orders placed this encounter:  No orders of the defined types were placed in this encounter.    Derek Jack, MD [place] [phone]   I, Thana Ates, am acting as a scribe for Dr. Derek Jack.  I, Derek Jack MD, have reviewed the above documentation for accuracy and completeness, and I agree with the above.

## 2021-08-08 ENCOUNTER — Encounter (HOSPITAL_COMMUNITY)
Admission: RE | Admit: 2021-08-08 | Discharge: 2021-08-08 | Disposition: A | Discharge status: Home / Self Care | Payer: BC Managed Care – PPO | Source: Ambulatory Visit | Attending: Nephrology | Admitting: Nephrology

## 2021-08-08 ENCOUNTER — Encounter (HOSPITAL_COMMUNITY): Payer: Self-pay

## 2021-08-08 DIAGNOSIS — D631 Anemia in chronic kidney disease: Secondary | ICD-10-CM | POA: Insufficient documentation

## 2021-08-08 DIAGNOSIS — N184 Chronic kidney disease, stage 4 (severe): Secondary | ICD-10-CM | POA: Insufficient documentation

## 2021-08-08 LAB — POCT HEMOGLOBIN-HEMACUE: Hemoglobin: 10.6 g/dL — ABNORMAL LOW (ref 13.0–17.0)

## 2021-08-08 MED ORDER — EPOETIN ALFA-EPBX 10000 UNIT/ML IJ SOLN: 10000.0000 [IU] | Freq: Once | INTRAMUSCULAR | Status: DC

## 2021-08-10 ENCOUNTER — Other Ambulatory Visit: Payer: Self-pay

## 2021-08-10 ENCOUNTER — Inpatient Hospital Stay (HOSPITAL_COMMUNITY)
Admission: EM | Admit: 2021-08-10 | Discharge: 2021-08-15 | DRG: 291 | Disposition: A | Discharge status: Home / Self Care | Payer: BC Managed Care – PPO | Attending: Family Medicine | Admitting: Family Medicine

## 2021-08-10 ENCOUNTER — Emergency Department (HOSPITAL_COMMUNITY): Payer: BC Managed Care – PPO

## 2021-08-10 ENCOUNTER — Encounter (HOSPITAL_COMMUNITY): Payer: Self-pay

## 2021-08-10 DIAGNOSIS — Z20822 Contact with and (suspected) exposure to covid-19: Secondary | ICD-10-CM | POA: Diagnosis present

## 2021-08-10 DIAGNOSIS — Z833 Family history of diabetes mellitus: Secondary | ICD-10-CM | POA: Diagnosis not present

## 2021-08-10 DIAGNOSIS — K219 Gastro-esophageal reflux disease without esophagitis: Secondary | ICD-10-CM | POA: Diagnosis present

## 2021-08-10 DIAGNOSIS — I509 Heart failure, unspecified: Secondary | ICD-10-CM

## 2021-08-10 DIAGNOSIS — N183 Chronic kidney disease, stage 3 unspecified: Secondary | ICD-10-CM

## 2021-08-10 DIAGNOSIS — C9000 Multiple myeloma not having achieved remission: Secondary | ICD-10-CM | POA: Diagnosis present

## 2021-08-10 DIAGNOSIS — N170 Acute kidney failure with tubular necrosis: Secondary | ICD-10-CM | POA: Diagnosis present

## 2021-08-10 DIAGNOSIS — R001 Bradycardia, unspecified: Secondary | ICD-10-CM | POA: Diagnosis present

## 2021-08-10 DIAGNOSIS — Z91048 Other nonmedicinal substance allergy status: Secondary | ICD-10-CM

## 2021-08-10 DIAGNOSIS — Z794 Long term (current) use of insulin: Secondary | ICD-10-CM

## 2021-08-10 DIAGNOSIS — E785 Hyperlipidemia, unspecified: Secondary | ICD-10-CM | POA: Diagnosis present

## 2021-08-10 DIAGNOSIS — I252 Old myocardial infarction: Secondary | ICD-10-CM

## 2021-08-10 DIAGNOSIS — I480 Paroxysmal atrial fibrillation: Secondary | ICD-10-CM | POA: Diagnosis present

## 2021-08-10 DIAGNOSIS — D630 Anemia in neoplastic disease: Secondary | ICD-10-CM | POA: Diagnosis present

## 2021-08-10 DIAGNOSIS — J189 Pneumonia, unspecified organism: Secondary | ICD-10-CM | POA: Diagnosis present

## 2021-08-10 DIAGNOSIS — I2510 Atherosclerotic heart disease of native coronary artery without angina pectoris: Secondary | ICD-10-CM | POA: Diagnosis present

## 2021-08-10 DIAGNOSIS — Z8249 Family history of ischemic heart disease and other diseases of the circulatory system: Secondary | ICD-10-CM

## 2021-08-10 DIAGNOSIS — N179 Acute kidney failure, unspecified: Secondary | ICD-10-CM | POA: Diagnosis present

## 2021-08-10 DIAGNOSIS — I5041 Acute combined systolic (congestive) and diastolic (congestive) heart failure: Secondary | ICD-10-CM | POA: Diagnosis not present

## 2021-08-10 DIAGNOSIS — E1122 Type 2 diabetes mellitus with diabetic chronic kidney disease: Secondary | ICD-10-CM | POA: Diagnosis present

## 2021-08-10 DIAGNOSIS — M1990 Unspecified osteoarthritis, unspecified site: Secondary | ICD-10-CM | POA: Diagnosis present

## 2021-08-10 DIAGNOSIS — I5022 Chronic systolic (congestive) heart failure: Secondary | ICD-10-CM

## 2021-08-10 DIAGNOSIS — J9601 Acute respiratory failure with hypoxia: Secondary | ICD-10-CM | POA: Diagnosis present

## 2021-08-10 DIAGNOSIS — I4892 Unspecified atrial flutter: Secondary | ICD-10-CM

## 2021-08-10 DIAGNOSIS — Z955 Presence of coronary angioplasty implant and graft: Secondary | ICD-10-CM

## 2021-08-10 DIAGNOSIS — R0600 Dyspnea, unspecified: Secondary | ICD-10-CM | POA: Diagnosis not present

## 2021-08-10 DIAGNOSIS — I77810 Thoracic aortic ectasia: Secondary | ICD-10-CM | POA: Diagnosis present

## 2021-08-10 DIAGNOSIS — N184 Chronic kidney disease, stage 4 (severe): Secondary | ICD-10-CM | POA: Diagnosis present

## 2021-08-10 DIAGNOSIS — N1832 Chronic kidney disease, stage 3b: Secondary | ICD-10-CM | POA: Diagnosis not present

## 2021-08-10 DIAGNOSIS — E1151 Type 2 diabetes mellitus with diabetic peripheral angiopathy without gangrene: Secondary | ICD-10-CM | POA: Diagnosis present

## 2021-08-10 DIAGNOSIS — Z9989 Dependence on other enabling machines and devices: Secondary | ICD-10-CM | POA: Diagnosis not present

## 2021-08-10 DIAGNOSIS — I5043 Acute on chronic combined systolic (congestive) and diastolic (congestive) heart failure: Secondary | ICD-10-CM | POA: Diagnosis not present

## 2021-08-10 DIAGNOSIS — C9001 Multiple myeloma in remission: Secondary | ICD-10-CM

## 2021-08-10 DIAGNOSIS — J90 Pleural effusion, not elsewhere classified: Secondary | ICD-10-CM

## 2021-08-10 DIAGNOSIS — I5023 Acute on chronic systolic (congestive) heart failure: Secondary | ICD-10-CM | POA: Diagnosis present

## 2021-08-10 DIAGNOSIS — Z6841 Body Mass Index (BMI) 40.0 and over, adult: Secondary | ICD-10-CM | POA: Diagnosis not present

## 2021-08-10 DIAGNOSIS — M109 Gout, unspecified: Secondary | ICD-10-CM | POA: Diagnosis present

## 2021-08-10 DIAGNOSIS — G4733 Obstructive sleep apnea (adult) (pediatric): Secondary | ICD-10-CM

## 2021-08-10 DIAGNOSIS — R079 Chest pain, unspecified: Principal | ICD-10-CM

## 2021-08-10 DIAGNOSIS — D631 Anemia in chronic kidney disease: Secondary | ICD-10-CM | POA: Diagnosis present

## 2021-08-10 DIAGNOSIS — Z79899 Other long term (current) drug therapy: Secondary | ICD-10-CM

## 2021-08-10 DIAGNOSIS — I130 Hypertensive heart and chronic kidney disease with heart failure and stage 1 through stage 4 chronic kidney disease, or unspecified chronic kidney disease: Principal | ICD-10-CM | POA: Diagnosis present

## 2021-08-10 DIAGNOSIS — Z7901 Long term (current) use of anticoagulants: Secondary | ICD-10-CM

## 2021-08-10 DIAGNOSIS — I255 Ischemic cardiomyopathy: Secondary | ICD-10-CM | POA: Diagnosis present

## 2021-08-10 DIAGNOSIS — I890 Lymphedema, not elsewhere classified: Secondary | ICD-10-CM | POA: Diagnosis present

## 2021-08-10 DIAGNOSIS — E118 Type 2 diabetes mellitus with unspecified complications: Secondary | ICD-10-CM | POA: Diagnosis not present

## 2021-08-10 DIAGNOSIS — N2581 Secondary hyperparathyroidism of renal origin: Secondary | ICD-10-CM | POA: Diagnosis present

## 2021-08-10 DIAGNOSIS — Z87891 Personal history of nicotine dependence: Secondary | ICD-10-CM

## 2021-08-10 DIAGNOSIS — Z91014 Allergy to mammalian meats: Secondary | ICD-10-CM

## 2021-08-10 DIAGNOSIS — I5021 Acute systolic (congestive) heart failure: Secondary | ICD-10-CM

## 2021-08-10 DIAGNOSIS — R0689 Other abnormalities of breathing: Secondary | ICD-10-CM

## 2021-08-10 LAB — CBC
HCT: 35.3 % — ABNORMAL LOW (ref 39.0–52.0)
Hemoglobin: 11.4 g/dL — ABNORMAL LOW (ref 13.0–17.0)
MCH: 32.7 pg (ref 26.0–34.0)
MCHC: 32.3 g/dL (ref 30.0–36.0)
MCV: 101.1 fL — ABNORMAL HIGH (ref 80.0–100.0)
Platelets: 137 10*3/uL — ABNORMAL LOW (ref 150–400)
RBC: 3.49 MIL/uL — ABNORMAL LOW (ref 4.22–5.81)
RDW: 15.6 % — ABNORMAL HIGH (ref 11.5–15.5)
WBC: 8.4 10*3/uL (ref 4.0–10.5)
nRBC: 0 % (ref 0.0–0.2)

## 2021-08-10 LAB — BASIC METABOLIC PANEL
Anion gap: 11 (ref 5–15)
BUN: 85 mg/dL — ABNORMAL HIGH (ref 8–23)
CO2: 23 mmol/L (ref 22–32)
Calcium: 8.8 mg/dL — ABNORMAL LOW (ref 8.9–10.3)
Chloride: 105 mmol/L (ref 98–111)
Creatinine, Ser: 3.5 mg/dL — ABNORMAL HIGH (ref 0.61–1.24)
GFR, Estimated: 19 mL/min — ABNORMAL LOW (ref >60)
Glucose, Bld: 171 mg/dL — ABNORMAL HIGH (ref 70–99)
Potassium: 4.3 mmol/L (ref 3.5–5.1)
Sodium: 139 mmol/L (ref 135–145)

## 2021-08-10 LAB — GLUCOSE, CAPILLARY: Glucose-Capillary: 179 mg/dL — ABNORMAL HIGH (ref 70–99)

## 2021-08-10 LAB — TROPONIN I (HIGH SENSITIVITY)
Troponin I (High Sensitivity): 34 ng/L — ABNORMAL HIGH (ref <18)
Troponin I (High Sensitivity): 34 ng/L — ABNORMAL HIGH (ref <18)

## 2021-08-10 LAB — BRAIN NATRIURETIC PEPTIDE: B Natriuretic Peptide: 475 pg/mL — ABNORMAL HIGH (ref 0.0–100.0)

## 2021-08-10 LAB — RESP PANEL BY RT-PCR (FLU A&B, COVID) ARPGX2
Influenza A by PCR: NEGATIVE
Influenza B by PCR: NEGATIVE
SARS Coronavirus 2 by RT PCR: NEGATIVE

## 2021-08-10 MED ORDER — INSULIN ASPART 100 UNIT/ML IJ SOLN
0.0000 [IU] | Freq: Every day | INTRAMUSCULAR | Status: DC
  Administered 2021-08-12: 2 [IU] via SUBCUTANEOUS

## 2021-08-10 MED ORDER — ATORVASTATIN CALCIUM 40 MG PO TABS
80.0000 mg | ORAL_TABLET | Freq: Every day | ORAL | Status: DC
  Administered 2021-08-10 – 2021-08-14 (×5): 80 mg via ORAL
  Filled 2021-08-10 (×5): qty 2

## 2021-08-10 MED ORDER — RIVAROXABAN 20 MG PO TABS
20.0000 mg | ORAL_TABLET | Freq: Every day | ORAL | Status: DC
  Administered 2021-08-10 – 2021-08-12 (×3): 20 mg via ORAL
  Filled 2021-08-10 (×3): qty 1

## 2021-08-10 MED ORDER — ONDANSETRON HCL 4 MG PO TABS: 4.0000 mg | ORAL_TABLET | Freq: Four times a day (QID) | ORAL | Status: DC | PRN

## 2021-08-10 MED ORDER — ALUM & MAG HYDROXIDE-SIMETH 200-200-20 MG/5ML PO SUSP
30.0000 mL | Freq: Once | ORAL | Status: AC
  Administered 2021-08-10: 30 mL via ORAL
  Filled 2021-08-10: qty 30

## 2021-08-10 MED ORDER — POTASSIUM CHLORIDE CRYS ER 20 MEQ PO TBCR
40.0000 meq | EXTENDED_RELEASE_TABLET | Freq: Two times a day (BID) | ORAL | Status: DC
  Administered 2021-08-10 – 2021-08-15 (×10): 40 meq via ORAL
  Filled 2021-08-10 (×10): qty 2

## 2021-08-10 MED ORDER — ISOSORBIDE MONONITRATE ER 60 MG PO TB24
30.0000 mg | ORAL_TABLET | Freq: Every day | ORAL | Status: DC
  Administered 2021-08-11 – 2021-08-15 (×5): 30 mg via ORAL
  Filled 2021-08-10 (×5): qty 1

## 2021-08-10 MED ORDER — AMLODIPINE BESYLATE 5 MG PO TABS
5.0000 mg | ORAL_TABLET | Freq: Every day | ORAL | Status: DC
Start: 2021-08-11 — End: 2021-08-12
  Administered 2021-08-11 – 2021-08-12 (×2): 5 mg via ORAL
  Filled 2021-08-10 (×2): qty 1

## 2021-08-10 MED ORDER — FUROSEMIDE 10 MG/ML IJ SOLN
40.0000 mg | Freq: Once | INTRAMUSCULAR | Status: AC
  Administered 2021-08-10: 40 mg via INTRAVENOUS
  Filled 2021-08-10: qty 4

## 2021-08-10 MED ORDER — LIDOCAINE VISCOUS HCL 2 % MT SOLN
15.0000 mL | Freq: Once | OROMUCOSAL | Status: AC
Start: 2021-08-10 — End: 2021-08-10
  Administered 2021-08-10: 15 mL via ORAL
  Filled 2021-08-10: qty 15

## 2021-08-10 MED ORDER — FUROSEMIDE 10 MG/ML IJ SOLN
40.0000 mg | Freq: Two times a day (BID) | INTRAMUSCULAR | Status: DC
  Administered 2021-08-11 – 2021-08-12 (×3): 40 mg via INTRAVENOUS
  Filled 2021-08-10 (×3): qty 4

## 2021-08-10 MED ORDER — HYDRALAZINE HCL 25 MG PO TABS
100.0000 mg | ORAL_TABLET | Freq: Three times a day (TID) | ORAL | Status: DC
  Administered 2021-08-10 – 2021-08-15 (×14): 100 mg via ORAL
  Filled 2021-08-10 (×15): qty 4

## 2021-08-10 MED ORDER — ONDANSETRON HCL 4 MG/2ML IJ SOLN: 4.0000 mg | Freq: Four times a day (QID) | INTRAMUSCULAR | Status: DC | PRN

## 2021-08-10 MED ORDER — INSULIN ASPART 100 UNIT/ML IJ SOLN
0.0000 [IU] | Freq: Three times a day (TID) | INTRAMUSCULAR | Status: DC
  Administered 2021-08-11: 3 [IU] via SUBCUTANEOUS
  Administered 2021-08-11: 2 [IU] via SUBCUTANEOUS
  Administered 2021-08-11 – 2021-08-12 (×2): 3 [IU] via SUBCUTANEOUS
  Administered 2021-08-12: 2 [IU] via SUBCUTANEOUS
  Administered 2021-08-12: 3 [IU] via SUBCUTANEOUS
  Administered 2021-08-13: 2 [IU] via SUBCUTANEOUS
  Administered 2021-08-13: 3 [IU] via SUBCUTANEOUS
  Administered 2021-08-13 – 2021-08-15 (×6): 2 [IU] via SUBCUTANEOUS

## 2021-08-10 MED ORDER — ACETAMINOPHEN 325 MG PO TABS
650.0000 mg | ORAL_TABLET | Freq: Four times a day (QID) | ORAL | Status: DC | PRN
Start: 2021-08-10 — End: 2021-08-15
  Administered 2021-08-10: 650 mg via ORAL
  Filled 2021-08-10: qty 2

## 2021-08-10 NOTE — ED Triage Notes (Signed)
Patient BIB EMS after chest pain that started this am along with SOB. Hx MI Stents DM ?

## 2021-08-10 NOTE — H&P (Signed)
History and Physical    Patient: [name] OMB:[id number] DOB: [birth year] DOA: 08/10/2021 DOS: the patient was seen and examined on 08/10/2021 PCP: [name], MD  Patient coming from: Home  Chief Complaint:  Chief Complaint  Patient presents with   Chest Pain   HPI: [name] is a 61 y.o. male with medical history significant of CAD with history of MI, hypertension, HFrEF at 30-35% on 10/2020, atrial flutter on chronic anticoagulation, diabetes, history of multiple myeloma, obstructive sleep apnea on CPAP.  Patient presents with worsening shortness of breath and dyspnea on exertion that started 3 days ago.  He has only been able to walk approximately 10 to 15 feet before becoming extremely short of breath.  Normally he is able to walk anywhere he would like at a slow pace.  In addition, he has been noticing increasing leg edema..  His symptoms have been worsening over the past couple of days.  What prompted his visit to the ER was increasing chest pain that started this morning in the left chest.  He rated his pain as severe.  He called EMS who came and evaluated him and brought him to the hospital for further evaluation.  He did get nitroglycerin on the way which did not improve his pain too much.  Review of Systems: As mentioned in the history of present illness. All other systems reviewed and are negative. Past Medical History:  Diagnosis Date   Abscess 04/2017   Scrotal   Anemia    Arteriosclerotic cardiovascular disease (ASCVD)    MI-2000s; stent to the proximal LAD and diagonal in 2001; stress nuclear in 2008-impaired exercise capacity, left ventricular dilatation, moderately to severely depressed EF, apical, inferior and anteroseptal scar   Arthritis    Atrial flutter (HCC)    Bence-Jones proteinuria 05/05/2011   Cellulitis of leg    both legs   Chronic diarrhea    Chronic kidney disease, stage 3, mod decreased GFR (HCC)    Creatinine of 1.84 in 06/2011 and 1.5 in  07/2011   Diabetes mellitus    Insulin   Dysrhythmia    AFlutter   GERD (gastroesophageal reflux disease)    Gout    Hyperlipidemia    Hypertension    Injection site reaction    Multiple myeloma 07/01/2011   Myocardial infarction ([place]) 2000   Obesity    Pedal edema    Venous insufficiency   Sleep apnea    uses cpap   Ulcer    Past Surgical History:  Procedure Laterality Date   ABSCESS DRAINAGE     Scrotal   BIOPSY  01/02/2012   Procedure: BIOPSY;  Surgeon: Rogene Houston, MD;  [facility];  Service: Endoscopy;  Laterality: N/A;   BONE MARROW BIOPSY  05/13/11   CARDIAC CATHETERIZATION     cardiac stent   CARDIOVERSION N/A 10/13/2012   Procedure: CARDIOVERSION;  Surgeon: Yehuda Savannah, MD;  Location: [facility];  Service: Cardiovascular;  Laterality: N/A;   CATARACT EXTRACTION W/PHACO Left 02/13/2014   Procedure: CATARACT EXTRACTION PHACO AND INTRAOCULAR LENS PLACEMENT ([place]);  Surgeon: Tonny Branch, MD;  Location: [facility];  Service: Ophthalmology;  Laterality: Left;  CDE:  7.67   CATARACT EXTRACTION W/PHACO Right 03/02/2014   Procedure: CATARACT EXTRACTION PHACO AND INTRAOCULAR LENS PLACEMENT RIGHT EYE CDE=16.81;  Surgeon: Tonny Branch, MD;  Location: [facility];  Service: Ophthalmology;  Laterality: Right;   COLONOSCOPY  11/28/2011   Procedure: COLONOSCOPY;  Surgeon: Rogene Houston,  MD;  [facility];  Service: Endoscopy;  Laterality: N/A;  [place] Right 12/16/2017   Procedure: CYSTOSCOPY WITH RIGHT RETROGRADE PYELOGRAM AND RIGHT URETERAL STENT REMOVAL;  Surgeon: Cleon Gustin, MD;  [facility]: [facility];  Service: Urology;  Laterality: Right;   CYSTOSCOPY W/ URETERAL STENT PLACEMENT Right 07/31/2017   Procedure: CYSTOSCOPY WITH RETROGRADE PYELOGRAM/URETERAL STENT PLACEMENT;  Surgeon: Cleon Gustin, MD;  [facility];  Service: Urology;  Laterality: Right;   ESOPHAGOGASTRODUODENOSCOPY  01/02/2012   Procedure: ESOPHAGOGASTRODUODENOSCOPY  (EGD);  Surgeon: Rogene Houston, MD;  [facility];  Service: Endoscopy;  Laterality: N/A;  100   ESOPHAGOGASTRODUODENOSCOPY N/A 09/20/2012   Procedure: ESOPHAGOGASTRODUODENOSCOPY (EGD);  Surgeon: Rogene Houston, MD;  [facility];  Service: Endoscopy;  Laterality: N/A;   EUS N/A 10/07/2012   Procedure: UPPER ENDOSCOPIC ULTRASOUND (EUS) LINEAR;  Surgeon: Milus Banister, MD;  [facility];  Service: Endoscopy;  Laterality: N/A;   INCISION AND DRAINAGE ABSCESS N/A 04/14/2017   Procedure: INCISION AND DRAINAGE ABSCESS;  Surgeon: Ceasar Mons, MD;  [facility];  Service: Urology;  Laterality: N/A;   INCISION AND DRAINAGE ABSCESS ANAL     IRRIGATION AND DEBRIDEMENT ABSCESS N/A 04/15/2017   Procedure: DEBRIDEMENT SCROTAL WOUND AND DRESSING CHANGE;  Surgeon: Ceasar Mons, MD;  [facility];  Service: Urology;  Laterality: N/A;   IRRIGATION AND DEBRIDEMENT ABSCESS N/A 04/17/2017   Procedure: IRRIGATION AND DEBRIDEMENT ABSCESS;  Surgeon: Ceasar Mons, MD;  [facility];  Service: Urology;  Laterality: N/A;  RM 3   LAPAROSCOPIC GASTRIC BANDING  2006   has been removed   OSTECTOMY Right 04/08/2017   Procedure: OSTECTOMY RIGHT GREAT TOE;  Surgeon: Caprice Beaver, DPM;  [facility];  Service: Podiatry;  Laterality: Right;   PORT-A-CATH REMOVAL Left 12/07/2012   Procedure: REMOVAL PORT-A-CATH;  Surgeon: Scherry Ran, MD;  [facility];  Service: General;  Laterality: Left;   PORT-A-CATH REMOVAL N/A 10/02/2017   Procedure: MINOR REMOVAL PORT-A-CATH AT BEDSIDE;  Surgeon: Donnie Mesa, MD;  Location: [facility];  Service: General;  Laterality: N/A;   PORTACATH PLACEMENT  07/07/2011   Procedure: INSERTION PORT-A-CATH;  Surgeon: Scherry Ran, MD;  [facility];  Service: General;  Laterality: N/A;   PORTACATH PLACEMENT N/A 12/07/2012   Procedure: INSERTION PORT-A-CATH;  Surgeon: Scherry Ran, MD;  [facility];  Service: General;  Laterality: N/A;  Attempted portacath placement on left and right side   URETEROSCOPY Right 12/16/2017   Procedure: DIAGNOSTIC RIGHT URETEROSCOPY;  Surgeon: Cleon Gustin, MD;  [facility];  Service: Urology;  Laterality: Right;   WOUND DEBRIDEMENT Right 04/08/2017   Procedure: EXCISION ULCERATION RIGHT GREAT TOE;  Surgeon: Caprice Beaver, DPM;  Location[facility];  Service: Podiatry;  Laterality: Right;   WRIST SURGERY     Left; removal of bone fragment   Social History:  reports that he quit smoking about 20 years ago. His smoking use included cigarettes and cigars. He has a 0.25 pack-year smoking history. He has never used smokeless tobacco. He reports that he does not drink alcohol and does not use drugs.  Allergies  Allergen Reactions   Beef-Derived Products Diarrhea   Pork-Derived Products Diarrhea   Other     Band aids     Family History  Problem Relation Age of Onset   Heart disease Mother    Cancer Mother  Diabetes Father    Arthritis Other    Anesthesia problems Neg Hx    Hypotension Neg Hx    Malignant hyperthermia Neg Hx    Pseudochol deficiency Neg Hx     Prior to Admission medications   Medication Sig Start Date End Date Taking? Authorizing Provider  acyclovir (ZOVIRAX) 400 MG tablet Take 1 tablet (400 mg total) by mouth every morning. 02/18/17  Yes Twana First, MD  allopurinol (ZYLOPRIM) 300 MG tablet TAKE ONE TABLET BY MOUTH ONCE DAILY. 09/15/17   Holley Bouche, NP  amLODipine (NORVASC) 5 MG tablet Take 1 tablet (5 mg total) by mouth daily. 06/21/21 09/19/21  Arnoldo Lenis, MD  atorvastatin (LIPITOR) 80 MG tablet TAKE ONE TABLET BY MOUTH AT BEDTIME. 07/29/21   Arnoldo Lenis, MD  Blood Glucose Monitoring Suppl (ACCU-CHEK GUIDE) w/Device KIT USE TO CHECK BLOOD SUGARSITWICE DAILY. 07/01/21   [provider]  calcitRIOL (ROCALTROL) 0.25 MCG capsule Take 0.25 mcg by mouth 3 (three) times a week. 07/29/21    [provider]  Cholecalciferol (D2000 ULTRA STRENGTH) 50 MCG (2000 UT) CAPS Take by mouth.    [provider]  Ciclopirox 0.77 % gel SMARTSIG:Sparingly Topical Twice Daily 02/27/21   [provider]  ciprofloxacin (CIPRO) 500 MG tablet Take 500 mg by mouth 2 (two) times daily. 04/30/21   [provider]  CONTOUR NEXT TEST test strip  09/20/18   [provider]  CREON 36000-114000 units CPEP capsule Take 36,000 Units by mouth 3 (three) times daily as needed. 07/01/21   [provider]  dicyclomine (BENTYL) 20 MG tablet TAKE 1 TABLET BY MOUTH BEFORE MEALS 3 TIMES DAILY. 04/19/18   Rehman, Mechele Dawley, MD  Eluxadoline 100 MG TABS Take 100 mg by mouth 2 (two) times daily with a meal.    [provider]  epoetin alfa (EPOGEN) 3000 UNIT/ML injection Inject 3,000 Units into the skin once. 12/19/20   [provider]  ferrous sulfate 325 (65 FE) MG EC tablet Take 45 mg by mouth. Every other day    [provider]  gabapentin (NEURONTIN) 600 MG tablet TAKE 1 TABLET BY MOUTH ONCE A DAY. 03/01/19   Lockamy, Randi L, NP-C  GVOKE PFS 0.5 MG/0.1ML SOSY Inject into the skin. 04/30/21   [provider]  hydrALAZINE (APRESOLINE) 100 MG tablet Take 1 tablet (100 mg total) by mouth 3 (three) times daily. 11/15/20   Arnoldo Lenis, MD  insulin aspart (NOVOLOG) 100 UNIT/ML injection Inject into the skin. Sliding scale over 150= 1 unit, 200= 2 units,250= 3 units    [provider]  insulin detemir (LEVEMIR) 100 UNIT/ML injection Inject 0.1 mLs (10 Units total) into the skin every morning. 04/22/17   Janece Canterbury, MD  INSULIN SYRINGE .5CC/29G 29G X 1/2" 0.5 ML MISC  11/26/18   [provider]  isosorbide mononitrate (IMDUR) 30 MG 24 hr tablet Take 1 tablet (30 mg total) by mouth daily. 06/21/21 09/19/21  Arnoldo Lenis, MD  loratadine (CLARITIN) 10 MG tablet Take 10 mg by mouth every morning.    [provider]  Magnesium Cl-Calcium Carbonate (SLOW-MAG PO) Take 1 tablet by mouth every morning.    [provider]  Microlet Lancets [place]  10/07/18   [provider]  Multiple Vitamins-Minerals (MULTIVITAMINS THER. W/MINERALS) TABS Take 1 tablet by mouth daily.    [provider]  mupirocin ointment (BACTROBAN) 2 % Apply topically 2 (two) times daily. 02/26/21  [provider]  nitroGLYCERIN (NITROSTAT) 0.4 MG SL tablet DISSOLVE 1 TABLET UNDER TONGUE EVERY 5 MINUTES UP TO 15 MIN FOR CHESTPAIN. IF NO RELIEF CALL 911. 05/11/17   Holley Bouche, NP  pantoprazole (PROTONIX) 40 MG tablet Take 40 mg by mouth daily.    [provider]  potassium chloride SA (KLOR-CON) 20 MEQ tablet Take 40 mEq by mouth daily.    [provider]  rivaroxaban (XARELTO) 20 MG TABS tablet Take 1 tablet (20 mg total) by mouth daily with supper. 06/21/21   Arnoldo Lenis, MD  sodium bicarbonate 650 MG tablet Take 650 mg by mouth 3 (three) times daily. 05/18/19   [provider]  SURE COMFORT PEN NEEDLES 29G X 12.7MM MISC USE AS DIRECTED [place]. 06/12/19   [provider]  torsemide (DEMADEX) 20 MG tablet Take 40 mg by mouth daily.    [provider]  Torsemide 40 MG TABS Take by mouth.    [provider]    Physical Exam: Vitals:   08/10/21 1835 08/10/21 1855 08/10/21 1930 08/10/21 2000  BP: (!) 143/69  (!) 192/179 (!) 147/94  Pulse: 79 79 80 85  Resp: 20 (!) 26 (!) 22 (!) 24  Temp:      TempSrc:      SpO2: 90% 92% 94% 93%  Weight:      Height:       General: Elderly male. Awake and alert and oriented x3. No acute cardiopulmonary distress.  HEENT: Normocephalic atraumatic.  Right and left ears normal in appearance.  Pupils equal, round, reactive to light. Extraocular muscles are intact. Sclerae anicteric and noninjected.  Moist mucosal membranes. No mucosal lesions.  Neck: Neck supple without lymphadenopathy. No  carotid bruits. No masses palpated.  Cardiovascular: Regular rate with normal S1-S2 sounds. No murmurs, rubs, gallops auscultated.  Increased JVD.  2+ pitting edema Respiratory: Distant breath sounds.  Rales in bases.  No accessory muscle use. Abdomen: Soft, nontender, nondistended. Active bowel sounds. No masses or hepatosplenomegaly  Skin: No rashes, lesions, or ulcerations.  Dry, warm to touch. 2+ dorsalis pedis and radial pulses. Musculoskeletal: No calf or leg pain. All major joints not erythematous nontender.  No upper or lower joint deformation.  Good ROM.  No contractures  Psychiatric: Intact judgment and insight. Pleasant and cooperative. Neurologic: No focal neurological deficits. Strength is 5/5 and symmetric in upper and lower extremities.  Cranial nerves II through XII are grossly intact.  Data Reviewed: Results for orders placed or performed during the hospital encounter of 08/10/21 (from the past 24 hour(s))  Basic metabolic panel     Status: Abnormal   Collection Time: 08/10/21  4:50 PM  Result Value Ref Range   Sodium 139 135 - 145 mmol/L   Potassium 4.3 3.5 - 5.1 mmol/L   Chloride 105 98 - 111 mmol/L   CO2 23 22 - 32 mmol/L   Glucose, Bld 171 (H) 70 - 99 mg/dL   BUN 85 (H) 8 - 23 mg/dL   Creatinine, Ser 3.50 (H) 0.61 - 1.24 mg/dL   Calcium 8.8 (L) 8.9 - 10.3 mg/dL   GFR, Estimated 19 (L) >60 mL/min   Anion gap 11 5 - 15  CBC     Status: Abnormal   Collection Time: 08/10/21  4:50 PM  Result Value Ref Range   WBC 8.4 4.0 - 10.5 K/uL   RBC 3.49 (L) 4.22 - 5.81 MIL/uL   Hemoglobin 11.4 (L) 13.0 - 17.0 g/dL  HCT 35.3 (L) 39.0 - 52.0 %   MCV 101.1 (H) 80.0 - 100.0 fL   MCH 32.7 26.0 - 34.0 pg   MCHC 32.3 30.0 - 36.0 g/dL   RDW 15.6 (H) 11.5 - 15.5 %   Platelets 137 (L) 150 - 400 K/uL   nRBC 0.0 0.0 - 0.2 %  Troponin I (High Sensitivity)     Status: Abnormal   Collection Time: 08/10/21  4:50 PM  Result Value Ref Range   Troponin I (High Sensitivity) 34 (H) <18 ng/L   Brain natriuretic peptide     Status: Abnormal   Collection Time: 08/10/21  4:52 PM  Result Value Ref Range   B Natriuretic Peptide 475.0 (H) 0.0 - 100.0 pg/mL  Resp Panel by RT-PCR (Flu A&B, Covid) Nasopharyngeal Swab     Status: None   Collection Time: 08/10/21  5:38 PM   Specimen: Nasopharyngeal Swab; Nasopharyngeal(NP) swabs in vial transport medium  Result Value Ref Range   SARS Coronavirus 2 by RT PCR NEGATIVE NEGATIVE   Influenza A by PCR NEGATIVE NEGATIVE   Influenza B by PCR NEGATIVE NEGATIVE  Troponin I (High Sensitivity)     Status: Abnormal   Collection Time: 08/10/21  6:38 PM  Result Value Ref Range   Troponin I (High Sensitivity) 34 (H) <18 ng/L   *Note: Due to a large number of results and/or encounters for the requested time period, some results have not been displayed. A complete set of results can be found in Results Review.   DG Chest 2 View  Result Date: 08/10/2021 CLINICAL DATA:  Chest pain since this morning, shortness of breath EXAM: CHEST - 2 VIEW COMPARISON:  08/07/2021 FINDINGS: Enlargement of cardiac silhouette with pulmonary vascular congestion. Increased LEFT pleural effusion and basilar atelectasis/consolidation. Remaining lungs clear. No pneumothorax or acute osseous findings. IMPRESSION: Enlargement of cardiac silhouette with pulmonary vascular congestion. Increased LEFT pleural effusion and LEFT basilar atelectasis/consolidation. Electronically Signed   By: Lavonia Dana M.D.   On: 08/10/2021 17:28    EKG independently reviewed.  Sinus rhythm at 83.  Nonspecific intraventricular conduction delay with LAD.  Old anteroseptal infarct.  No acute ST changes.  Assessment and Plan: No notes have been filed under this hospital service. Service: Hospitalist  Principal Problem:   Acute CHF (congestive heart failure) (HCC) Active Problems:   Type 2 diabetes mellitus with complication, with long-term current use of insulin (HCC)   OSA on CPAP   Multiple myeloma  (HCC)   Atrial flutter (HCC)   Chronic systolic heart failure (HCC)   CKD (chronic kidney disease) stage 3, GFR 30-59 ml/min (HCC)  Acute heart failure with reduced EF with history of CAD, MI, HTN Telemetry monitoring Strict I/O Daily Weights Diuresis: lasix 11m IV BID. Starting to diurese 1L in ED. Potassium: 40 mEq twice a day by mouth Echo cardiac exam tomorrow Repeat BMP tomorrow  Type 2 diabetes Home insulin Sliding scale insulin, CBGs before meals and nightly History of atrial flutter On Xarelto History of multiple myeloma Stage III chronic kidney disease Gentle diuresis We will follow creatinine Obstructive sleep apnea on CPAP CPAP at night   Advance Care Planning:   Code Status: Prior full code  Consults: None  Family Communication: Brother present during interview and exam  Severity of Illness: The appropriate patient status for this patient is INPATIENT. Inpatient status is judged to be reasonable and necessary in order to provide the required intensity of service to ensure the patient's safety. The  patient's presenting symptoms, physical exam findings, and initial radiographic and laboratory data in the context of their chronic comorbidities is felt to place them at high risk for further clinical deterioration. Furthermore, it is not anticipated that the patient will be medically stable for discharge from the hospital within 2 midnights of admission.   * I certify that at the point of admission it is my clinical judgment that the patient will require inpatient hospital care spanning beyond 2 midnights from the point of admission due to high intensity of service, high risk for further deterioration and high frequency of surveillance required.*  Author: Truett Mainland, DO 08/10/2021 8:23 PM  For on call review [email or web address].

## 2021-08-10 NOTE — ED Provider Notes (Signed)
[facility] EMERGENCY DEPARTMENT Provider Note   CSN: [id number] Arrival date & time: 08/10/21  1638     History Chief Complaint  Patient presents with   Chest Pain    [name] is a 61 y.o. male with history of CKD stage III, diabetes, GERD, MI with 1 stent, hypertension, and hyperlipidemia who presents to the emergency department with chest pain that started this morning at 7am when he woke up.  Patient was not doing anything exertional at that time.  He reports associated shortness of breath.  Patient states has been having trouble with his weights and that is being cared for and evaluated at his nephrologist.  Last saw them on 08/05/2021.  Patient denies any nausea, vomiting, diarrhea, fever, chills, cough.  Also complaining of some congestion and upper abdominal pain.  Nitroglycerin given prior to arrival via EMS has not improved chest pain.   Chest Pain     Home Medications Prior to Admission medications   Medication Sig Start Date End Date Taking? Authorizing Provider  acyclovir (ZOVIRAX) 400 MG tablet Take 1 tablet (400 mg total) by mouth every morning. 02/18/17   Twana First, MD  allopurinol (ZYLOPRIM) 300 MG tablet TAKE ONE TABLET BY MOUTH ONCE DAILY. 09/15/17   Holley Bouche, NP  amLODipine (NORVASC) 5 MG tablet Take 1 tablet (5 mg total) by mouth daily. 06/21/21 09/19/21  Arnoldo Lenis, MD  atorvastatin (LIPITOR) 80 MG tablet TAKE ONE TABLET BY MOUTH AT BEDTIME. 07/29/21   Arnoldo Lenis, MD  Blood Glucose Monitoring Suppl (ACCU-CHEK GUIDE) w/Device KIT USE TO CHECK BLOOD SUGARSITWICE DAILY. 07/01/21   [provider]  calcitRIOL (ROCALTROL) 0.25 MCG capsule Take 0.25 mcg by mouth 3 (three) times a week. 07/29/21   [provider]  Cholecalciferol (D2000 ULTRA STRENGTH) 50 MCG (2000 UT) CAPS Take by mouth.    [provider]  Ciclopirox 0.77 % gel SMARTSIG:Sparingly Topical Twice Daily 02/27/21   [provider]   ciprofloxacin (CIPRO) 500 MG tablet Take 500 mg by mouth 2 (two) times daily. 04/30/21   [provider]  CONTOUR NEXT TEST test strip  09/20/18   [provider]  CREON 36000-114000 units CPEP capsule Take 36,000 Units by mouth 3 (three) times daily as needed. 07/01/21   [provider]  dicyclomine (BENTYL) 20 MG tablet TAKE 1 TABLET BY MOUTH BEFORE MEALS 3 TIMES DAILY. 04/19/18   Rehman, Mechele Dawley, MD  Eluxadoline 100 MG TABS Take 100 mg by mouth 2 (two) times daily with a meal.    [provider]  epoetin alfa (EPOGEN) 3000 UNIT/ML injection Inject 3,000 Units into the skin once. 12/19/20   [provider]  ferrous sulfate 325 (65 FE) MG EC tablet Take 45 mg by mouth. Every other day    [provider]  gabapentin (NEURONTIN) 600 MG tablet TAKE 1 TABLET BY MOUTH ONCE A DAY. 03/01/19   Lockamy, Randi L, NP-C  GVOKE PFS 0.5 MG/0.1ML SOSY Inject into the skin. 04/30/21   [provider]  hydrALAZINE (APRESOLINE) 100 MG tablet Take 1 tablet (100 mg total) by mouth 3 (three) times daily. 11/15/20   Arnoldo Lenis, MD  insulin aspart (NOVOLOG) 100 UNIT/ML injection Inject into the skin. Sliding scale over 150= 1 unit, 200= 2 units,250= 3 units    [provider]  insulin detemir (LEVEMIR) 100 UNIT/ML injection Inject 0.1 mLs (10 Units total) into the skin every morning. 04/22/17   Janece Canterbury,  MD  INSULIN SYRINGE .5CC/29G 29G X 1/2" 0.5 ML MISC  11/26/18   [provider]  isosorbide mononitrate (IMDUR) 30 MG 24 hr tablet Take 1 tablet (30 mg total) by mouth daily. 06/21/21 09/19/21  Arnoldo Lenis, MD  loratadine (CLARITIN) 10 MG tablet Take 10 mg by mouth every morning.    [provider]  Magnesium Cl-Calcium Carbonate (SLOW-MAG PO) Take 1 tablet by mouth every morning.    [provider]  Microlet Lancets [place]  10/07/18   [provider]  Multiple Vitamins-Minerals (MULTIVITAMINS THER.  W/MINERALS) TABS Take 1 tablet by mouth daily.    [provider]  mupirocin ointment (BACTROBAN) 2 % Apply topically 2 (two) times daily. 02/26/21   [provider]  nitroGLYCERIN (NITROSTAT) 0.4 MG SL tablet DISSOLVE 1 TABLET UNDER TONGUE EVERY 5 MINUTES UP TO 15 MIN FOR CHESTPAIN. IF NO RELIEF CALL 911. 05/11/17   Holley Bouche, NP  pantoprazole (PROTONIX) 40 MG tablet Take 40 mg by mouth daily.    [provider]  potassium chloride SA (KLOR-CON) 20 MEQ tablet Take 40 mEq by mouth daily.    [provider]  rivaroxaban (XARELTO) 20 MG TABS tablet Take 1 tablet (20 mg total) by mouth daily with supper. 06/21/21   Arnoldo Lenis, MD  sodium bicarbonate 650 MG tablet Take 650 mg by mouth 3 (three) times daily. 05/18/19   [provider]  SURE COMFORT PEN NEEDLES 29G X 12.7MM MISC USE AS DIRECTED [place]. 06/12/19   [provider]  torsemide (DEMADEX) 20 MG tablet Take 40 mg by mouth daily.    [provider]  Torsemide 40 MG TABS Take by mouth.    [provider]      Allergies    Beef-derived products, Pork-derived products, and Other    Review of Systems   Review of Systems  Cardiovascular:  Positive for chest pain.  All other systems reviewed and are negative.  Physical Exam Updated Vital Signs BP (!) 143/69    Pulse 79 Comment: 2L [place]   Temp 99.6 F (37.6 C) (Oral)    Resp (!) 26 Comment: 2L [place]   Ht '6\' 2"'  (1.88 m)    Wt (!) 179.2 kg    SpO2 92% Comment: 2L [place]   BMI 50.71 kg/m  Physical Exam Vitals and nursing note reviewed.  Constitutional:      General: He is not in acute distress.    Appearance: Normal appearance.  HENT:     Head: Normocephalic and atraumatic.  Eyes:     General:        Right eye: No discharge.        Left eye: No discharge.  Cardiovascular:     Comments: Regular rate and rhythm.  S1/S2 are distinct without any evidence of murmur, rubs, or gallops.  Radial pulses are 2+  bilaterally.  Dorsalis pedis pulses are 2+ bilaterally.  No evidence of pedal edema. Pulmonary:     Comments: Rales bilaterally. Normal effort. No evidence of wheezing.  Abdominal:     General: Abdomen is flat. Bowel sounds are normal. There is no distension.     Tenderness: There is abdominal tenderness in the epigastric area. There is no guarding or rebound.  Musculoskeletal:        General: Normal range of motion.     Cervical back: Neck supple.  Skin:    General: Skin is warm and dry.     Findings:  No rash.  Neurological:     General: No focal deficit present.     Mental Status: He is alert.  Psychiatric:        Mood and Affect: Mood normal.        Behavior: Behavior normal.    ED Results / Procedures / Treatments   Labs (all labs ordered are listed, but only abnormal results are displayed) Labs Reviewed  BASIC METABOLIC PANEL - Abnormal; Notable for the following components:      Result Value   Glucose, Bld 171 (*)    BUN 85 (*)    Creatinine, Ser 3.50 (*)    Calcium 8.8 (*)    GFR, Estimated 19 (*)    All other components within normal limits  CBC - Abnormal; Notable for the following components:   RBC 3.49 (*)    Hemoglobin 11.4 (*)    HCT 35.3 (*)    MCV 101.1 (*)    RDW 15.6 (*)    Platelets 137 (*)    All other components within normal limits  BRAIN NATRIURETIC PEPTIDE - Abnormal; Notable for the following components:   B Natriuretic Peptide 475.0 (*)    All other components within normal limits  TROPONIN I (HIGH SENSITIVITY) - Abnormal; Notable for the following components:   Troponin I (High Sensitivity) 34 (*)    All other components within normal limits  RESP PANEL BY RT-PCR (FLU A&B, COVID) ARPGX2  TROPONIN I (HIGH SENSITIVITY)    EKG None  Radiology DG Chest 2 View  Result Date: 08/10/2021 CLINICAL DATA:  Chest pain since this morning, shortness of breath EXAM: CHEST - 2 VIEW COMPARISON:  08/07/2021 FINDINGS: Enlargement of cardiac silhouette  with pulmonary vascular congestion. Increased LEFT pleural effusion and basilar atelectasis/consolidation. Remaining lungs clear. No pneumothorax or acute osseous findings. IMPRESSION: Enlargement of cardiac silhouette with pulmonary vascular congestion. Increased LEFT pleural effusion and LEFT basilar atelectasis/consolidation. Electronically Signed   By: Lavonia Dana M.D.   On: 08/10/2021 17:28    Procedures Procedures    Medications Ordered in ED Medications  alum & mag hydroxide-simeth (MAALOX/MYLANTA) 200-200-20 MG/5ML suspension 30 mL (30 mLs Oral Given 08/10/21 1726)    And  lidocaine (XYLOCAINE) 2 % viscous mouth solution 15 mL (15 mLs Oral Given 08/10/21 1726)  furosemide (LASIX) injection 40 mg (40 mg Intravenous Given 08/10/21 1750)    ED Course/ Medical Decision Making/ A&P Clinical Course as of 08/10/21 1905  Sat Aug 10, 2021  1846 On reevaluation, patient's chest pain is improved.  Patient is having chills however. [CF]    Clinical Course User Index [CF] Hendricks Limes, PA-C                           Medical Decision Making Amount and/or Complexity of Data Reviewed Labs: ordered.  Risk OTC drugs. Prescription drug management.   This patient presents to the ED for concern of chest pain and shortness of breath, this involves an extensive number of treatment options, and is a complaint that carries with it a high risk of complications and morbidity.  The differential diagnosis includes ACS, heart failure, acute on chronic renal failure, pneumonia, sepsis, dysrhythmia.   Co morbidities that complicate the patient evaluation  Chronic kidney disease Diabetes Hypertension Hyperlipidemia   Additional history obtained:  Additional history obtained from nursing note and old records External records from outside source obtained and reviewed including nephrology visit on 08/05/2021 where he  had lab work.  This was compared to labs today.   Lab Tests:  I Ordered, and  personally interpreted labs.  The pertinent results include: CBC which was without evidence of leukocytosis but did show chronic stable anemia and thrombocytopenia which seems to be better than in December on Care Everywhere.  BNP was 475.  Initial troponin was 34.  Delta troponin and respiratory panel are still pending.   Imaging Studies ordered:  I ordered imaging studies including chest x-ray I independently visualized and interpreted imaging which showed pulmonary congestion and increased left-sided pleural effusion I agree with the radiologist interpretation   Cardiac Monitoring:  The patient was maintained on a cardiac monitor.  I personally viewed and interpreted the cardiac monitored which showed an underlying rhythm of: Normal sinus rhythm   Medicines ordered and prescription drug management:  I ordered medication including GI cocktail for chest pain and upper abdominal pain.  Furosemide for increased pleural effusion and pulmonary congestion in the setting of volume overload.  Patient currently making urine. Reevaluation of the patient after these medicines showed that the patient improved I have reviewed the patients home medicines and have made adjustments as needed   Problem List / ED Course:  Chest pain or shortness of breath.  Patient arrived with right-sided very sharp chest pain.  Work-up was immediately initiated.  Patient was clinically volume overloaded with some rales and the bases bilaterally.  Patient does have elevated troponin which I believed to be likely demand ischemia.  Elevated BNP to 475 which is improved from previous result of 3 years ago.  Echocardiogram was reviewed on care everywhere which showed an ejection fraction of 30 to 40%.  Given the clinical findings, furosemide was given to help aid in facilitating diuresis.  Patient will likely need to come in for further fluid management in the hospital.  With delta troponin and respiratory panel still pending  rest of his care will be transferred to Marcene Brawn, PA-C for ultimate disposition.  Patient may need cardiology consult and ultimately admission with the hospitalist.   Reevaluation:  After the interventions noted above, I reevaluated the patient and found that they have :improved   Dispostion:  After consideration of the diagnostic results and the patients response to treatment, I feel that the patent would benefit from admission in the hospital. The rest of his care will be transferred to [facility] where ultimate disposition will be made.   Final Clinical Impression(s) / ED Diagnoses Final diagnoses:  None    Rx / DC Orders ED Discharge Orders     None         Cherrie Gauze 08/10/21 Skip Estimable, MD 08/11/21 [phone]

## 2021-08-10 NOTE — ED Provider Notes (Signed)
Patient's care assumed at 7 PM.  Patient is a 61 year old past medical history of stage III kidney disease diabetes GERD hypertension hyperlipidemia and previous MI with a stent.  Patient reports chest pain and shortness of breath that began this morning around 7 AM not relieved by nitroglycerin.  ? ?Patient's ED evaluation has showed a BUN of 85 creatinine of 3.5 which are at patient's baseline.  His hemoglobin is 11.4.  Patient's chest x-ray shows increasing left pleural effusion and enlarged cardiac silhouette.  Patient has an elevated BNP of 475.  Initial troponin is 30 4 repeat troponin is 34.  Patient has had nitroglycerin without relief of pain at home.  Patient has been given Lasix 40 mg IV.  ? ?I will consult hospitalist for admission for further diuresis.  Dr. Nehemiah Settle will see for admission ?  ?Fransico Meadow, [place] ?08/10/21 2012 ? ?  ?Dorie Rank, MD ?08/11/21 1503 ? ?

## 2021-08-10 NOTE — ED Notes (Signed)
Meal tray ordered from dietary ?

## 2021-08-10 NOTE — ED Notes (Signed)
Placed on 2L [place] due to desat in 80's ?

## 2021-08-11 ENCOUNTER — Inpatient Hospital Stay (HOSPITAL_COMMUNITY): Payer: BC Managed Care – PPO

## 2021-08-11 DIAGNOSIS — I5021 Acute systolic (congestive) heart failure: Secondary | ICD-10-CM

## 2021-08-11 LAB — BASIC METABOLIC PANEL
Anion gap: 7 (ref 5–15)
BUN: 87 mg/dL — ABNORMAL HIGH (ref 8–23)
CO2: 25 mmol/L (ref 22–32)
Calcium: 8.6 mg/dL — ABNORMAL LOW (ref 8.9–10.3)
Chloride: 108 mmol/L (ref 98–111)
Creatinine, Ser: 3.57 mg/dL — ABNORMAL HIGH (ref 0.61–1.24)
GFR, Estimated: 18 mL/min — ABNORMAL LOW (ref >60)
Glucose, Bld: 147 mg/dL — ABNORMAL HIGH (ref 70–99)
Potassium: 4.6 mmol/L (ref 3.5–5.1)
Sodium: 140 mmol/L (ref 135–145)

## 2021-08-11 LAB — ECHOCARDIOGRAM COMPLETE
Area-P 1/2: 5.84 cm2
Calc EF: 40.6 %
Height: 74 in
S' Lateral: 6 cm
Single Plane A2C EF: 47.3 %
Single Plane A4C EF: 31.4 %
Weight: 6076.8 oz

## 2021-08-11 LAB — PROCALCITONIN: Procalcitonin: 0.42 ng/mL

## 2021-08-11 LAB — GLUCOSE, CAPILLARY
Glucose-Capillary: 127 mg/dL — ABNORMAL HIGH (ref 70–99)
Glucose-Capillary: 156 mg/dL — ABNORMAL HIGH (ref 70–99)
Glucose-Capillary: 165 mg/dL — ABNORMAL HIGH (ref 70–99)
Glucose-Capillary: 152 mg/dL — ABNORMAL HIGH (ref 70–99)

## 2021-08-11 LAB — HIV ANTIBODY (ROUTINE TESTING W REFLEX): HIV Screen 4th Generation wRfx: NONREACTIVE

## 2021-08-11 MED ORDER — PANCRELIPASE (LIP-PROT-AMYL) 12000-38000 UNITS PO CPEP
36000.0000 [IU] | ORAL_CAPSULE | Freq: Three times a day (TID) | ORAL | Status: DC
  Administered 2021-08-11 – 2021-08-15 (×12): 36000 [IU] via ORAL
  Filled 2021-08-11 (×12): qty 3

## 2021-08-11 MED ORDER — PERFLUTREN LIPID MICROSPHERE
1.0000 mL | INTRAVENOUS | Status: AC | PRN
  Administered 2021-08-11: 2 mL via INTRAVENOUS
  Filled 2021-08-11: qty 10

## 2021-08-11 NOTE — TOC Progression Note (Signed)
Transition of Care (TOC) - Progression Note  ? ? ?Patient Details  ?Name: [name] ?MRN: [id number] ?Date of Birth: 1961/05/07 ? ?Transition of Care (TOC) CM/SW Contact  ?Kerin Salen, RN ?Phone Number: ?08/11/2021, 3:14 PM ? ?Clinical Narrative:  TOCRN spoke with patient, wife at bedside. Patient still drives and able to keep medical appointments and pick up medications from  the [facility]. Uses can for ambulation, no HH services. Also uses C-Pap at night serviced by [employer]. Wife is great support system per patient. No identified TOC needs at this time, will continue to track for needs.  ? ? ? ?Expected Discharge Plan: Home/Self Care ?Barriers to Discharge: Continued Medical Work up ? ?Expected Discharge Plan and Services ?Expected Discharge Plan: Home/Self Care ?In-house Referral: Clinical Social Work ?  ?  ?Living arrangements for the past 2 months: [place] ?                ?  ?  ?  ?  ?  ?  ?  ?  ?  ?  ? ? ?Social Determinants of Health (SDOH) Interventions ?  ? ?Readmission Risk Interventions ?Readmission Risk Prevention Plan 08/11/2021  ?Transportation Screening Complete  ?PCP or Specialist Appt within 3-5 Days Not Complete  ?Not Complete comments Will schedule at discharge.  ?[place] or Home Care Consult Complete  ?Social Work Consult for [place] Planning/Counseling Complete  ?Palliative Care Screening Not Applicable  ?Medication Review Press photographer) Complete  ?Some recent data might be hidden  ? ? ?

## 2021-08-11 NOTE — Progress Notes (Signed)
PROGRESS NOTE     [name], is a 61 y.o. male, DOB - [birth year], BEM:[id number]  Admit date - 08/10/2021   Admitting Physician Truett Mainland, DO  Outpatient Primary MD for the patient is Franklin Beard, MD  LOS - 1  Chief Complaint  Patient presents with   Chest Pain        Brief Narrative:  61 year old with past medical history relevant for CAD with prior MI, multiple myeloma, systolic dysfunction CHF, morbid obesity, CKD 4, HTN, atrial flutter on chronic anticoagulation, DM2, OSA on CPAP admitted on 08/10/2021 with acute on chronic CHF exacerbation with new onset hypoxia   -Assessment and Plan:   1)HFrEF--is 35 to 40%. The left ventricle has  moderately decreased function. The left  ventricular internal cavity size was severely dilated -Continue IV Lasix 40 mg twice daily -Daily weights fluid input and output monitor -Continue hydralazine/isosorbide combo in lieu of ACE/ARB due to renal concerns  2)Normocytic anemia--in the setting of CKD 4 and underlying multiple myeloma -Patient follows with [name] for Retacrit and Venofer infusion  3) CKD stage -IV   -Progressive CKD creatinine currently around 3.5 -Patient sees [name] -Attempts to aggressively diurese him is resulted in AKI on CKD previously -renally adjust medications, avoid nephrotoxic agents / dehydration  / hypotension  4) acute hypoxic respiratory failure--- secondary to 1 above -Anticipate improvement with diuresis -Repeat chest x-ray on 08/12/2021 after diuresis to rule out underlying pneumonia -Able to wean patient down to 2 L of oxygen via nasal cannula from 4 L earlier today  5)Morbid Obesity/OSA -Low calorie diet, portion control and increase physical activity discussed with patient -Body mass index is 49.11 kg/m. -Continue CPAP nightly  6) DM2--.A1C pending Use Novolog/Humalog Sliding scale insulin with Accu-Cheks/Fingersticks as ordered   7) chronic atrial  flutter/fibrillation-rate appears controlled, continue Xarelto for stroke prophylaxis  8)HTN-continue amlodipine 5 mg daily, along with hydralazine and isosorbide  Disposition/Need for in-Hospital Stay- patient unable to be discharged at this time due to -acute systolic dysfunction CHF Patient requiring IV diuresis and supplemental oxygen  Status is: Inpatient   Disposition: The patient is from: Home              Anticipated d/c is to: Home              Anticipated d/c date is: 2 days              Patient currently is not medically stable to d/c. Barriers: Not Clinically Stable-   Code Status :  -  Code Status: Full Code   Family Communication:    NA (patient is alert, awake and coherent)  -Discussed with wife at bedside  DVT Prophylaxis  :   - SCDs    rivaroxaban (XARELTO) tablet 20 mg   Lab Results  Component Value Date   PLT 137 (L) 08/10/2021    Inpatient Medications  Scheduled Meds:  amLODipine  5 mg Oral Daily   atorvastatin  80 mg Oral QHS   furosemide  40 mg Intravenous BID   hydrALAZINE  100 mg Oral TID   insulin aspart  0-15 Units Subcutaneous TID WC   insulin aspart  0-5 Units Subcutaneous QHS   isosorbide mononitrate  30 mg Oral Daily   lipase/protease/amylase  36,000 Units Oral TID AC   potassium chloride  40 mEq Oral BID   rivaroxaban  20 mg Oral Q supper   Continuous Infusions: PRN Meds:.acetaminophen, ondansetron **OR** ondansetron (ZOFRAN) IV  Anti-infectives (From admission, onward)    None         Subjective: [name] today has no fevers, no emesis,  No chest pain,   -Reports significant dyspnea on exertion -Hypoxia -Wife at bedside questions answered -Patient is voiding okay   Objective: Vitals:   08/11/21 0334 08/11/21 0500 08/11/21 0834 08/11/21 0849  BP: (!) 107/45   134/68  Pulse: 86   62  Resp:    20  Temp: 100.2 F (37.9 C)   98.5 F (36.9 C)  TempSrc: Oral   Oral  SpO2: 98%  96% 94%  Weight:  (!) 172.3 kg  (!)  173.5 kg  Height:        Intake/Output Summary (Last 24 hours) at 08/11/2021 1605 Last data filed at 08/11/2021 1400 Gross per 24 hour  Intake --  Output 1200 ml  Net -1200 ml   Filed Weights   08/10/21 2235 08/11/21 0500 08/11/21 0849  Weight: (!) 172.4 kg (!) 172.3 kg (!) 173.5 kg    Physical Exam  Gen:- Awake Alert, morbidly obese, significant dyspnea on exertion HEENT:- [place].AT, No sclera icterus Nose- [place] 2 to 4L/min Lungs-diminished breath sounds with bibasilar CV- S1, S2 normal, regular  Abd-  +ve B.Sounds, Abd Soft, No tenderness, increased truncal adiposity Extremity/Skin:-2+ pitting edema, pedal pulses present  Psych-affect is appropriate, oriented x3 Neuro-generalized weakness, no new focal deficits, no tremors  Data Reviewed: I have personally reviewed following labs and imaging studies  CBC: Recent Labs  Lab 08/08/21 0817 08/10/21 1650  WBC  --  8.4  HGB 10.6* 11.4*  HCT  --  35.3*  MCV  --  101.1*  PLT  --  478*   Basic Metabolic Panel: Recent Labs  Lab 08/10/21 1650 08/11/21 0520  NA 139 140  K 4.3 4.6  CL 105 108  CO2 23 25  GLUCOSE 171* 147*  BUN 85* 87*  CREATININE 3.50* 3.57*  CALCIUM 8.8* 8.6*   GFR: Estimated Creatinine Clearance: 36 mL/min (A) (by C-G formula based on SCr of 3.57 mg/dL (H)). Liver Function Tests: No results for input(s): AST, ALT, ALKPHOS, BILITOT, PROT, ALBUMIN in the last 168 hours. Cardiac Enzymes: No results for input(s): CKTOTAL, CKMB, CKMBINDEX, TROPONINI in the last 168 hours. BNP (last 3 results) No results for input(s): PROBNP in the last 8760 hours. HbA1C: No results for input(s): HGBA1C in the last 72 hours. Sepsis Labs: '@LABRCNTIP' (procalcitonin:4,lacticidven:4) ) Recent Results (from the past 240 hour(s))  Resp Panel by RT-PCR (Flu A&B, Covid) Nasopharyngeal Swab     Status: None   Collection Time: 08/10/21  5:38 PM   Specimen: Nasopharyngeal Swab; Nasopharyngeal(NP) swabs in vial transport medium   Result Value Ref Range Status   SARS Coronavirus 2 by RT PCR NEGATIVE NEGATIVE Final    Comment: (NOTE) SARS-CoV-2 target nucleic acids are NOT DETECTED.  The SARS-CoV-2 RNA is generally detectable in upper respiratory specimens during the acute phase of infection. The lowest concentration of SARS-CoV-2 viral copies this assay can detect is 138 copies/mL. A negative result does not preclude SARS-Cov-2 infection and should not be used as the sole basis for treatment or other patient management decisions. A negative result may occur with  improper specimen collection/handling, submission of specimen other than nasopharyngeal swab, presence of viral mutation(s) within the areas targeted by this assay, and inadequate number of viral copies(<138 copies/mL). A negative result must be combined with clinical observations, patient history, and epidemiological information. The expected result is Negative.  Fact Sheet for Patients:  EntrepreneurPulse.com.au  Fact Sheet for Healthcare Providers:  IncredibleEmployment.be  This test is no t yet approved or cleared by the [place] FDA and  has been authorized for detection and/or diagnosis of SARS-CoV-2 by FDA under an Emergency Use Authorization (EUA). This EUA will remain  in effect (meaning this test can be used) for the duration of the COVID-19 declaration under Section 564(b)(1) of the Act, 21 U.S.C.section 360bbb-3(b)(1), unless the authorization is terminated  or revoked sooner.       Influenza A by PCR NEGATIVE NEGATIVE Final   Influenza B by PCR NEGATIVE NEGATIVE Final    Comment: (NOTE) The Xpert Xpress SARS-CoV-2/FLU/RSV plus assay is intended as an aid in the diagnosis of influenza from Nasopharyngeal swab specimens and should not be used as a sole basis for treatment. Nasal washings and aspirates are unacceptable for Xpert Xpress SARS-CoV-2/FLU/RSV testing.  Fact Sheet for  Patients: EntrepreneurPulse.com.au  Fact Sheet for Healthcare Providers: IncredibleEmployment.be  This test is not yet approved or cleared by the [place] FDA and has been authorized for detection and/or diagnosis of SARS-CoV-2 by FDA under an Emergency Use Authorization (EUA). This EUA will remain in effect (meaning this test can be used) for the duration of the COVID-19 declaration under Section 564(b)(1) of the Act, 21 U.S.C. section 360bbb-3(b)(1), unless the authorization is terminated or revoked.  Performed at [facility], [address], [place], [place] [address]       Radiology Studies: DG Chest 2 View  Result Date: 08/10/2021 CLINICAL DATA:  Chest pain since this morning, shortness of breath EXAM: CHEST - 2 VIEW COMPARISON:  08/07/2021 FINDINGS: Enlargement of cardiac silhouette with pulmonary vascular congestion. Increased LEFT pleural effusion and basilar atelectasis/consolidation. Remaining lungs clear. No pneumothorax or acute osseous findings. IMPRESSION: Enlargement of cardiac silhouette with pulmonary vascular congestion. Increased LEFT pleural effusion and LEFT basilar atelectasis/consolidation. Electronically Signed   By: Lavonia Dana M.D.   On: 08/10/2021 17:28   ECHOCARDIOGRAM COMPLETE  Result Date: 08/11/2021    ECHOCARDIOGRAM REPORT   Patient Name:   [name] [facility] Date of Exam: 08/11/2021 Medical Rec #:  [id number]         Height:       74.0 in Accession #:    [id number]        Weight:       379.8 lb Date of Birth:  August 28, 1960         BSA:          2.854 m Patient Age:    35 years          BP:           134/68 mmHg Patient Gender: M                 HR:           58 bpm. Exam Location:  [name] Procedure: 2D Echo, Cardiac Doppler, Color Doppler and Intracardiac            Opacification Agent Indications:    I50.40* Unspecified combined systolic (congestive) and diastolic                 (congestive) heart failure  History:         Patient has prior history of Echocardiogram examinations, most                 recent 11/28/2020. CHF, Abnormal ECG, Arrythmias:Atrial Flutter,  Signs/Symptoms:Bacteremia; Risk Factors:Diabetes, Sleep Apnea,                 Hypertension, Dyslipidemia and Former Smoker.  Sonographer:    Roseanna Rainbow RDCS Referring Phys: [id number] JACOB J STINSON  Sonographer Comments: Technically difficult study due to poor echo windows and patient is morbidly obese. Image acquisition challenging due to patient body habitus. IMPRESSIONS  1. Poor acoustic windows APical window is somewhat foreshortened. Severe distal inferior , inferoseptal hypokinesis; apoap. Left ventricular ejection fraction, by estimation, is 35 to 40%. The left ventricle has moderately decreased function. The left ventricular internal cavity size was severely dilated.  2. Right ventricular systolic function is normal. The right ventricular size is normal. There is normal pulmonary artery systolic pressure.  3. The mitral valve is normal in structure. Mild mitral valve regurgitation.  4. Echobright density along closeure line of Left coronary cusp 10 x 6 mm. . The aortic valve is tricuspid. Aortic valve regurgitation is not visualized.  5. Aortic dilatation noted. There is mild dilatation of the ascending aorta, measuring 41 mm.  6. The inferior vena cava is dilated in size with <50% respiratory variability, suggesting right atrial pressure of 15 mmHg. Comparison(s): The left ventricular function is unchanged. FINDINGS  Left Ventricle: Poor acoustic windows APical window is somewhat foreshortened. Severe distal inferior , inferoseptal hypokinesis; apoap. Left ventricular ejection fraction, by estimation, is 35 to 40%. The left ventricle has moderately decreased function. Definity contrast agent was given IV to delineate the left ventricular endocardial borders. The left ventricular internal cavity size was severely dilated. There is no left ventricular  hypertrophy. Right Ventricle: The right ventricular size is normal. Right vetricular wall thickness was not assessed. Right ventricular systolic function is normal. There is normal pulmonary artery systolic pressure. The tricuspid regurgitant velocity is 2.56 m/s, and with an assumed right atrial pressure of 8 mmHg, the estimated right ventricular systolic pressure is 68.3 mmHg. Left Atrium: Left atrial size was normal in size. Right Atrium: Right atrial size was normal in size. Pericardium: There is no evidence of pericardial effusion. Mitral Valve: The mitral valve is normal in structure. Mild mitral valve regurgitation. Tricuspid Valve: The tricuspid valve is normal in structure. Tricuspid valve regurgitation is trivial. Aortic Valve: Echobright density along closeure line of Left coronary cusp 10 x 6 mm. The aortic valve is tricuspid. Aortic valve regurgitation is not visualized. Pulmonic Valve: The pulmonic valve was grossly normal. Pulmonic valve regurgitation is trivial. Aorta: The aortic root is normal in size and structure and aortic dilatation noted. There is mild dilatation of the ascending aorta, measuring 41 mm. Venous: The inferior vena cava is dilated in size with less than 50% respiratory variability, suggesting right atrial pressure of 15 mmHg. IAS/Shunts: No atrial level shunt detected by color flow Doppler.  LEFT VENTRICLE PLAX 2D LVIDd:         6.80 cm      Diastology LVIDs:         6.00 cm      LV e' medial:    3.65 cm/s LV PW:         1.00 cm      LV E/e' medial:  29.6 LV IVS:        0.80 cm      LV e' lateral:   7.22 cm/s LVOT diam:     2.70 cm      LV E/e' lateral: 15.0 LV SV:         125  LV SV Index:   44 LVOT Area:     5.73 cm  LV Volumes (MOD) LV vol d, MOD A2C: 364.0 ml LV vol d, MOD A4C: 408.0 ml LV vol s, MOD A2C: 192.0 ml LV vol s, MOD A4C: 280.0 ml LV SV MOD A2C:     172.0 ml LV SV MOD A4C:     408.0 ml LV SV MOD BP:      162.0 ml RIGHT VENTRICLE            IVC RV S prime:     7.95  cm/s  IVC diam: 3.50 cm TAPSE (M-mode): 1.4 cm LEFT ATRIUM           Index        RIGHT ATRIUM           Index LA diam:      4.80 cm 1.68 cm/m   RA Area:     17.20 cm LA Vol (A2C): 67.0 ml 23.47 ml/m  RA Volume:   41.30 ml  14.47 ml/m LA Vol (A4C): 54.7 ml 19.16 ml/m  AORTIC VALVE LVOT Vmax:   98.80 cm/s LVOT Vmean:  62.800 cm/s LVOT VTI:    0.218 m  AORTA Ao Root diam: 3.40 cm Ao Asc diam:  4.10 cm MITRAL VALVE                TRICUSPID VALVE MV Area (PHT): 5.84 cm     TR Peak grad:   26.2 mmHg MV Decel Time: 130 msec     TR Vmax:        256.00 cm/s MV E velocity: 108.00 cm/s MV A velocity: 52.00 cm/s   SHUNTS MV E/A ratio:  2.08         Systemic VTI:  0.22 m                             Systemic Diam: 2.70 cm Dorris Carnes MD Electronically signed by Dorris Carnes MD Signature Date/Time: 08/11/2021/1:53:37 PM    Final      Scheduled Meds:  amLODipine  5 mg Oral Daily   atorvastatin  80 mg Oral QHS   furosemide  40 mg Intravenous BID   hydrALAZINE  100 mg Oral TID   insulin aspart  0-15 Units Subcutaneous TID WC   insulin aspart  0-5 Units Subcutaneous QHS   isosorbide mononitrate  30 mg Oral Daily   lipase/protease/amylase  36,000 Units Oral TID AC   potassium chloride  40 mEq Oral BID   rivaroxaban  20 mg Oral Q supper   Continuous Infusions:   LOS: 1 day    Roxan Hockey M.D on 08/11/2021 at 4:05 PM  Go to [email or web address] - for contact info  [facility] - Office  [phone]  If 7PM-7AM, please contact night-coverage [email or web address] Password [facility] 08/11/2021, 4:05 PM

## 2021-08-11 NOTE — Progress Notes (Signed)
?  Echocardiogram ?2D Echocardiogram has been performed. ? ?[name] ?08/11/2021, 11:07 AM ?

## 2021-08-12 ENCOUNTER — Inpatient Hospital Stay (HOSPITAL_COMMUNITY): Payer: BC Managed Care – PPO

## 2021-08-12 DIAGNOSIS — R079 Chest pain, unspecified: Secondary | ICD-10-CM | POA: Diagnosis not present

## 2021-08-12 DIAGNOSIS — R0600 Dyspnea, unspecified: Secondary | ICD-10-CM | POA: Diagnosis not present

## 2021-08-12 DIAGNOSIS — I5043 Acute on chronic combined systolic (congestive) and diastolic (congestive) heart failure: Secondary | ICD-10-CM

## 2021-08-12 DIAGNOSIS — D509 Iron deficiency anemia, unspecified: Secondary | ICD-10-CM | POA: Insufficient documentation

## 2021-08-12 LAB — RENAL FUNCTION PANEL
Albumin: 2.7 g/dL — ABNORMAL LOW (ref 3.5–5.0)
Anion gap: 7 (ref 5–15)
BUN: 88 mg/dL — ABNORMAL HIGH (ref 8–23)
CO2: 25 mmol/L (ref 22–32)
Calcium: 8.4 mg/dL — ABNORMAL LOW (ref 8.9–10.3)
Chloride: 106 mmol/L (ref 98–111)
Creatinine, Ser: 3.61 mg/dL — ABNORMAL HIGH (ref 0.61–1.24)
GFR, Estimated: 18 mL/min — ABNORMAL LOW (ref >60)
Glucose, Bld: 141 mg/dL — ABNORMAL HIGH (ref 70–99)
Phosphorus: 3.6 mg/dL (ref 2.5–4.6)
Potassium: 4.3 mmol/L (ref 3.5–5.1)
Sodium: 138 mmol/L (ref 135–145)

## 2021-08-12 LAB — CBC
HCT: 29.2 % — ABNORMAL LOW (ref 39.0–52.0)
Hemoglobin: 8.9 g/dL — ABNORMAL LOW (ref 13.0–17.0)
MCH: 30.9 pg (ref 26.0–34.0)
MCHC: 30.5 g/dL (ref 30.0–36.0)
MCV: 101.4 fL — ABNORMAL HIGH (ref 80.0–100.0)
Platelets: 120 10*3/uL — ABNORMAL LOW (ref 150–400)
RBC: 2.88 MIL/uL — ABNORMAL LOW (ref 4.22–5.81)
RDW: 15.5 % (ref 11.5–15.5)
WBC: 6.8 10*3/uL (ref 4.0–10.5)
nRBC: 0 % (ref 0.0–0.2)

## 2021-08-12 LAB — GLUCOSE, CAPILLARY
Glucose-Capillary: 142 mg/dL — ABNORMAL HIGH (ref 70–99)
Glucose-Capillary: 164 mg/dL — ABNORMAL HIGH (ref 70–99)
Glucose-Capillary: 151 mg/dL — ABNORMAL HIGH (ref 70–99)
Glucose-Capillary: 143 mg/dL — ABNORMAL HIGH (ref 70–99)

## 2021-08-12 LAB — HEMOGLOBIN A1C
Hgb A1c MFr Bld: 6.4 % — ABNORMAL HIGH (ref 4.8–5.6)
Mean Plasma Glucose: 137 mg/dL

## 2021-08-12 MED ORDER — DOXYCYCLINE HYCLATE 100 MG PO TABS
100.0000 mg | ORAL_TABLET | Freq: Two times a day (BID) | ORAL | Status: DC
  Administered 2021-08-12 – 2021-08-15 (×6): 100 mg via ORAL
  Filled 2021-08-12 (×6): qty 1

## 2021-08-12 MED ORDER — FUROSEMIDE 10 MG/ML IJ SOLN
80.0000 mg | Freq: Two times a day (BID) | INTRAMUSCULAR | Status: DC
Start: 2021-08-12 — End: 2021-08-15
  Administered 2021-08-12 – 2021-08-15 (×6): 80 mg via INTRAVENOUS
  Filled 2021-08-12 (×6): qty 8

## 2021-08-12 MED ORDER — SODIUM CHLORIDE 0.9 % IV SOLN
2.0000 g | INTRAVENOUS | Status: DC
  Administered 2021-08-12 – 2021-08-14 (×3): 2 g via INTRAVENOUS
  Filled 2021-08-12 (×3): qty 20

## 2021-08-12 NOTE — Consult Note (Addendum)
Cardiology Consultation:   Patient ID: ASIA FAVATA MRN: [id number]; DOB: 1961/03/11  Admit date: 08/10/2021 Date of Consult: 08/12/2021  PCP:  [name], MD   [facility] Cardiologist:  Kate Sable, MD (Inactive)   {   Patient Profile:   NEHEMYAH FOUSHEE is a 61 y.o. male with a hx of CAD with remote stenting, chronic systolic and diastolic heart failure, ?ICM (EF 30-35%), CKD stage 4, multiple myeloma, bradycardia on BB, chronic anemia, paroxysmal Aflutter on a/c, OSA on CPAP, obesity, HTN, HLD who is being seen 08/12/2021 for the evaluation of acute heart failure at the request of Dr/ Emokpae.  History of Present Illness:   Mr. Yan is followed by Dr. Harl Bowie for the above cardiac history. He reported history of CAD with MI in 200s with stent to the proximal LAD and diag in 2001. Stress test in 2008 showed impaired exercise capacity with LV dilation and moderately to severely depressed EF, apical and inf and anteroseptal scar. Echo in 2013 showed LVEF 35-40%. Echo 2015 LVEF 25-30%, Echo in 2017 with LVEF 35-40%, Echo 07/2019 showed LVEF 20-25% with G2DD. History of bradycardia on BB. CKD limits GDMT. Most recent echo 10/2020 showed LVEF 30-35% with G2DD. Has been on Torsemide for volume control, management help from nephrology.   Saw Dr Lovena Le 11/2020 for ICD consideration, not a great ICD candidate with concern for infection.   The patient has h/o Aflutter, was intermittently off Xarelto due to anemia.   Last seen 06/21/21 and LLE was improved on torsemide 67m BID. Weight was 391lbs.   The patient presented to the ER 08/10/21 for chest pain and shortness of breath. Patient reports worsening shortness of breath for the last 2-3 weeks. Breathing worse on exertion. Reports chest pain started 3 days ago, not worse with exertion. No associated nausea, vomiting, or diaphoresis. He uses CPAP nightly and has chronic orthopnea.   In the ER BP 143/69, pulse 9, 2L [place],  99.73F, RR 26. Labs showed [place] 3.5, BUN 85, Hgb 11.4, plt 137k. BNP 475. HS trop 34>34. Respiratory panel negative. CXR with pulmonary vascular congestion, left pleural effusion and left lower consolidation. Given IV lasix and admitted.    Past Medical History:  Diagnosis Date   Abscess 04/2017   Scrotal   Anemia    Arteriosclerotic cardiovascular disease (ASCVD)    MI-2000s; stent to the proximal LAD and diagonal in 2001; stress nuclear in 2008-impaired exercise capacity, left ventricular dilatation, moderately to severely depressed EF, apical, inferior and anteroseptal scar   Arthritis    Atrial flutter (HCC)    Bence-Jones proteinuria 05/05/2011   Cellulitis of leg    both legs   Chronic diarrhea    Chronic kidney disease, stage 3, mod decreased GFR (HCC)    Creatinine of 1.84 in 06/2011 and 1.5 in 07/2011   Diabetes mellitus    Insulin   Dysrhythmia    AFlutter   GERD (gastroesophageal reflux disease)    Gout    Hyperlipidemia    Hypertension    Injection site reaction    Multiple myeloma 07/01/2011   Myocardial infarction ([place] 2000   Obesity    Pedal edema    Venous insufficiency   Sleep apnea    uses cpap   Ulcer     Past Surgical History:  Procedure Laterality Date   ABSCESS DRAINAGE     Scrotal   BIOPSY  01/02/2012   Procedure: BIOPSY;  Surgeon: NRogene Houston MD;  [facility];  Service: Endoscopy;  Laterality: N/A;   BONE MARROW BIOPSY  05/13/11   CARDIAC CATHETERIZATION     cardiac stent   CARDIOVERSION N/A 10/13/2012   Procedure: CARDIOVERSION;  Surgeon: Yehuda Savannah, MD;  Location: [facility];  Service: Cardiovascular;  Laterality: N/A;   CATARACT EXTRACTION W/PHACO Left 02/13/2014   Procedure: CATARACT EXTRACTION PHACO AND INTRAOCULAR LENS PLACEMENT ([place]);  Surgeon: Tonny Branch, MD;  Location: [facility];  Service: Ophthalmology;  Laterality: Left;  CDE:  7.67   CATARACT EXTRACTION W/PHACO Right 03/02/2014   Procedure: CATARACT EXTRACTION PHACO AND  INTRAOCULAR LENS PLACEMENT RIGHT EYE CDE=16.81;  Surgeon: Tonny Branch, MD;  Location: [facility];  Service: Ophthalmology;  Laterality: Right;   COLONOSCOPY  11/28/2011   Procedure: COLONOSCOPY;  Surgeon: Rogene Houston, MD;  [facility];  Service: Endoscopy;  Laterality: N/A;  [place] Right 12/16/2017   Procedure: CYSTOSCOPY WITH RIGHT RETROGRADE PYELOGRAM AND RIGHT URETERAL STENT REMOVAL;  Surgeon: Cleon Gustin, MD;  Location: [facility];  Service: Urology;  Laterality: Right;   CYSTOSCOPY W/ URETERAL STENT PLACEMENT Right 07/31/2017   Procedure: CYSTOSCOPY WITH RETROGRADE PYELOGRAM/URETERAL STENT PLACEMENT;  Surgeon: Cleon Gustin, MD;  [facility];  Service: Urology;  Laterality: Right;   ESOPHAGOGASTRODUODENOSCOPY  01/02/2012   Procedure: ESOPHAGOGASTRODUODENOSCOPY (EGD);  Surgeon: Rogene Houston, MD;  [facility];  Service: Endoscopy;  Laterality: N/A;  100   ESOPHAGOGASTRODUODENOSCOPY N/A 09/20/2012   Procedure: ESOPHAGOGASTRODUODENOSCOPY (EGD);  Surgeon: Rogene Houston, MD;  [facility];  Service: Endoscopy;  Laterality: N/A;   EUS N/A 10/07/2012   Procedure: UPPER ENDOSCOPIC ULTRASOUND (EUS) LINEAR;  Surgeon: Milus Banister, MD;  [facility];  Service: Endoscopy;  Laterality: N/A;   INCISION AND DRAINAGE ABSCESS N/A 04/14/2017   Procedure: INCISION AND DRAINAGE ABSCESS;  Surgeon: Ceasar Mons, MD;  [facility];  Service: Urology;  Laterality: N/A;   INCISION AND DRAINAGE ABSCESS ANAL     IRRIGATION AND DEBRIDEMENT ABSCESS N/A 04/15/2017   Procedure: DEBRIDEMENT SCROTAL WOUND AND DRESSING CHANGE;  Surgeon: Ceasar Mons, MD;  [facility];  Service: Urology;  Laterality: N/A;   IRRIGATION AND DEBRIDEMENT ABSCESS N/A 04/17/2017   Procedure: IRRIGATION AND DEBRIDEMENT ABSCESS;  Surgeon: Ceasar Mons, MD;  [facility];  Service: Urology;  Laterality: N/A;  RM 3    LAPAROSCOPIC GASTRIC BANDING  2006   has been removed   OSTECTOMY Right 04/08/2017   Procedure: OSTECTOMY RIGHT GREAT TOE;  Surgeon: Caprice Beaver, DPM;  Location: [facility];  Service: Podiatry;  Laterality: Right;   PORT-A-CATH REMOVAL Left 12/07/2012   Procedure: REMOVAL PORT-A-CATH;  Surgeon: Scherry Ran, MD;  Location: [facility];  Service: General;  Laterality: Left;   PORT-A-CATH REMOVAL N/A 10/02/2017   Procedure: MINOR REMOVAL PORT-A-CATH AT BEDSIDE;  Surgeon: Donnie Mesa, MD;  Location: [facility];  Service: General;  Laterality: N/A;   PORTACATH PLACEMENT  07/07/2011   Procedure: INSERTION PORT-A-CATH;  Surgeon: Scherry Ran, MD;  [facility];  Service: General;  Laterality: N/A;   PORTACATH PLACEMENT N/A 12/07/2012   Procedure: INSERTION PORT-A-CATH;  Surgeon: Scherry Ran, MD;  [facility];  Service: General;  Laterality: N/A;  Attempted portacath placement on left and right side   URETEROSCOPY Right 12/16/2017   Procedure: DIAGNOSTIC RIGHT URETEROSCOPY;  Surgeon: Cleon Gustin, MD;  [facility];  Service: Urology;  Laterality: Right;  WOUND DEBRIDEMENT Right 04/08/2017   Procedure: EXCISION ULCERATION RIGHT GREAT TOE;  Surgeon: Caprice Beaver, DPM;  Location[facility];  Service: Podiatry;  Laterality: Right;   WRIST SURGERY     Left; removal of bone fragment     Home Medications:  Prior to Admission medications   Medication Sig Start Date End Date Taking? Authorizing Provider  acetaminophen (TYLENOL) 500 MG tablet Take 1,000 mg by mouth every 6 (six) hours as needed.   Yes [provider]  acyclovir (ZOVIRAX) 400 MG tablet Take 1 tablet (400 mg total) by mouth every morning. 02/18/17  Yes Twana First, MD  allopurinol (ZYLOPRIM) 300 MG tablet TAKE ONE TABLET BY MOUTH ONCE DAILY. 09/15/17  Yes Holley Bouche, NP  amLODipine (NORVASC) 5 MG tablet Take 1 tablet (5 mg total) by mouth daily. 06/21/21 09/19/21 Yes BranchAlphonse Guild, MD   atorvastatin (LIPITOR) 80 MG tablet TAKE ONE TABLET BY MOUTH AT BEDTIME. 07/29/21  Yes Branch, Alphonse Guild, MD  calcitRIOL (ROCALTROL) 0.25 MCG capsule Take 0.25 mcg by mouth 3 (three) times a week. Monday, Wednesday and Friday 07/29/21  Yes [provider]  Cholecalciferol (D2000 ULTRA STRENGTH) 50 MCG (2000 UT) CAPS Take by mouth.   Yes [provider]  Ciclopirox 0.77 % gel SMARTSIG:Sparingly Topical Twice Daily 02/27/21  Yes [provider]  CREON 36000-114000 units CPEP capsule Take 36,000 Units by mouth 3 (three) times daily as needed. 07/01/21  Yes [provider]  dicyclomine (BENTYL) 20 MG tablet TAKE 1 TABLET BY MOUTH BEFORE MEALS 3 TIMES DAILY. 04/19/18  Yes Rehman, Mechele Dawley, MD  Eluxadoline 100 MG TABS Take 100 mg by mouth 2 (two) times daily with a meal.   Yes [provider]  ferrous sulfate 325 (65 FE) MG EC tablet Take 45 mg by mouth. Every other day   Yes [provider]  gabapentin (NEURONTIN) 600 MG tablet TAKE 1 TABLET BY MOUTH ONCE A DAY. 03/01/19  Yes Lockamy, Randi L, NP-C  hydrALAZINE (APRESOLINE) 100 MG tablet Take 1 tablet (100 mg total) by mouth 3 (three) times daily. 11/15/20  Yes Branch, Alphonse Guild, MD  insulin aspart (NOVOLOG) 100 UNIT/ML injection Inject 1-3 Units into the skin 3 (three) times daily with meals. Sliding scale over 150= 1 unit, 200= 2 units,250= 3 units   Yes [provider]  insulin detemir (LEVEMIR) 100 UNIT/ML injection Inject 0.1 mLs (10 Units total) into the skin every morning. 04/22/17  Yes Short, Noah Delaine, MD  isosorbide mononitrate (IMDUR) 30 MG 24 hr tablet Take 1 tablet (30 mg total) by mouth daily. 06/21/21 09/19/21 Yes Branch, Alphonse Guild, MD  loratadine (CLARITIN) 10 MG tablet Take 10 mg by mouth every morning.   Yes [provider]  Magnesium Cl-Calcium Carbonate (SLOW-MAG PO) Take 1 tablet by mouth every morning.   Yes [provider]  Multiple Vitamins-Minerals  (MULTIVITAMINS THER. W/MINERALS) TABS Take 1 tablet by mouth daily.   Yes [provider]  nitroGLYCERIN (NITROSTAT) 0.4 MG SL tablet DISSOLVE 1 TABLET UNDER TONGUE EVERY 5 MINUTES UP TO 15 MIN FOR CHESTPAIN. IF NO RELIEF CALL 911. Patient taking differently: Place 0.4 mg under the tongue every 5 (five) minutes as needed. 05/11/17  Yes Holley Bouche, NP  pantoprazole (PROTONIX) 40 MG tablet Take 40 mg by mouth daily.   Yes [provider]  potassium chloride SA (KLOR-CON) 20 MEQ tablet Take 20 mEq by mouth See admin instructions. Take 40 meq in the morning and  20 meq every evening.   Yes [provider]  rivaroxaban (XARELTO) 20 MG TABS tablet Take 1 tablet (20 mg total) by mouth daily with supper. Patient taking differently: Take 20 mg by mouth in the morning. 06/21/21  Yes BranchAlphonse Guild, MD  sodium bicarbonate 650 MG tablet Take 650 mg by mouth 3 (three) times daily. 05/18/19  Yes [provider]  torsemide (DEMADEX) 20 MG tablet Take 20 mg by mouth See admin instructions. Take 40 mg in the morning and 20 mg every evening .   Yes [provider]  Blood Glucose Monitoring Suppl (ACCU-CHEK GUIDE) w/Device KIT USE TO CHECK BLOOD SUGARSITWICE DAILY. 07/01/21   [provider]  CONTOUR NEXT TEST test strip  09/20/18   [provider]  INSULIN SYRINGE .5CC/29G 29G X 1/2" 0.5 ML MISC  11/26/18   [provider]  Microlet Lancets [place]  10/07/18   [provider]  SURE COMFORT PEN NEEDLES 29G X 12.7MM MISC USE AS DIRECTED [place]. 06/12/19   [provider]    Inpatient Medications: Scheduled Meds:  amLODipine  5 mg Oral Daily   atorvastatin  80 mg Oral QHS   furosemide  40 mg Intravenous BID   hydrALAZINE  100 mg Oral TID   insulin aspart  0-15 Units Subcutaneous TID WC   insulin aspart  0-5 Units Subcutaneous QHS   isosorbide mononitrate  30 mg Oral Daily   lipase/protease/amylase  36,000 Units  Oral TID AC   potassium chloride  40 mEq Oral BID   rivaroxaban  20 mg Oral Q supper   Continuous Infusions:  PRN Meds: acetaminophen, ondansetron **OR** ondansetron (ZOFRAN) IV  Allergies:    Allergies  Allergen Reactions   Beef-Derived Products Diarrhea   Pork-Derived Products Diarrhea   Other     Band aids     Social History:   Social History   Socioeconomic History   Marital status: Married    Spouse name: Not on file   Number of children: Not on file   Years of education: 16   Highest education level: Not on file  Occupational History   Occupation: Nurse, learning disability   Occupation: Optometrist: [employer]  Tobacco Use   Smoking status: Former    Packs/day: 0.25    Years: 1.00    Pack years: 0.25    Types: Cigarettes, Cigars    Quit date: 05/17/2001    Years since quitting: 20.2   Smokeless tobacco: Never  Vaping Use   Vaping Use: Never used  Substance and Sexual Activity   Alcohol use: No    Alcohol/week: 0.0 standard drinks   Drug use: No   Sexual activity: Yes    Birth control/protection: None  Other Topics Concern   Not on file  Social History Narrative   Not on file   Social Determinants of Health   Financial Resource Strain: Not on file  Food Insecurity: Not on file  Transportation Needs: Not on file  Physical Activity: Not on file  Stress: Not on file  Social Connections: Not on file  Intimate Partner Violence: Not on file    Family History:    Family History  Problem Relation Age of Onset   Heart disease Mother    Cancer Mother    Diabetes Father    Arthritis Other    Anesthesia problems Neg Hx    Hypotension Neg Hx    Malignant hyperthermia Neg Hx  Pseudochol deficiency Neg Hx      ROS:  Please see the history of present illness.   All other ROS reviewed and negative.     Physical Exam/Data:   Vitals:   08/11/21 0849 08/11/21 2319 08/12/21 0407 08/12/21 0616  BP: 134/68 137/77 125/71    Pulse: 62  (!) 54   Resp: 20  14   Temp: 98.5 F (36.9 C)  97.8 F (36.6 C)   TempSrc: Oral  Oral   SpO2: 94%  98%   Weight: (!) 173.5 kg   (!) 170.3 kg  Height:        Intake/Output Summary (Last 24 hours) at 08/12/2021 0919 Last data filed at 08/12/2021 0615 Gross per 24 hour  Intake 240 ml  Output 2400 ml  Net -2160 ml   Last 3 Weights 08/12/2021 08/11/2021 08/11/2021  Weight (lbs) 375 lb 7.1 oz 382 lb 8 oz 379 lb 12.8 oz  Weight (kg) 170.3 kg 173.5 kg 172.276 kg     Body mass index is 48.2 kg/m.  General:  Well nourished, well developed, in no acute distress HEENT: normal Neck: + JVD Vascular: No carotid bruits; Distal pulses 2+ bilaterally Cardiac:  normal S1, S2; RRR; no murmur  Lungs:  diminished at bases Abd: soft, nontender, no hepatomegaly  Ext: no edema Musculoskeletal:  No deformities, BUE and BLE strength normal and equal Skin: warm and dry  Neuro:  CNs 2-12 intact, no focal abnormalities noted Psych:  Normal affect   EKG:  The EKG was personally reviewed and demonstrates:  NSR, 83bpm, IVCD, nonspecific T wave changes Telemetry:  Telemetry was personally reviewed and demonstrates:  NSR, HR 50-60s, PVCs  Relevant CV Studies:  Echo ordered  Echo 10/2020  1. Poor acoustic windows APical window is somewhat foreshortened. Severe  distal inferior , inferoseptal hypokinesis; apoap. Left ventricular  ejection fraction, by estimation, is 35 to 40%. The left ventricle has  moderately decreased function. The left  ventricular internal cavity size was severely dilated.   2. Right ventricular systolic function is normal. The right ventricular  size is normal. There is normal pulmonary artery systolic pressure.   3. The mitral valve is normal in structure. Mild mitral valve  regurgitation.   4. Echobright density along closeure line of Left coronary cusp 10 x 6  mm. . The aortic valve is tricuspid. Aortic valve regurgitation is not  visualized.   5. Aortic  dilatation noted. There is mild dilatation of the ascending  aorta, measuring 41 mm.   6. The inferior vena cava is dilated in size with <50% respiratory  variability, suggesting right atrial pressure of 15 mmHg.   Comparison(s): The left ventricular function is unchanged.   Echo 07/2019 1. Left ventricular ejection fraction, by estimation, is 25 to 30%. The  left ventricle has severely decreased function. The left ventricle  demonstrates global hypokinesis. The left ventricular internal cavity size  was mildly dilated. There is moderate  concentric left ventricular hypertrophy. Left ventricular diastolic  parameters are consistent with Grade II diastolic dysfunction  (pseudonormalization). Elevated left ventricular end-diastolic pressure.   2. Right ventricular systolic function is moderately reduced. The right  ventricular size is normal.   3. The mitral valve is grossly normal. Trivial mitral valve  regurgitation.   4. The aortic valve is tricuspid. Aortic valve regurgitation is not  visualized. Mild aortic valve sclerosis is present, with no evidence of  aortic valve stenosis.   5. The inferior vena cava is dilated  in size with >50% respiratory  variability, suggesting right atrial pressure of 8 mmHg.   Echo 2019 Study Conclusions   - Procedure narrative: Transthoracic echocardiography. Image    quality was adequate. The study was technically difficult, as a    result of restricted patient mobility and body habitus.    Intravenous contrast (Definity) was administered.  - Left ventricle: The cavity size was severely dilated. Wall    thickness was increased in a pattern of mild LVH. Systolic    function was moderately to severely reduced. The estimated    ejection fraction was in the range of 30% to 35%. Diffuse    hypokinesis. Features are consistent with a pseudonormal left    ventricular filling pattern, with concomitant abnormal relaxation    and increased filling pressure  (grade 2 diastolic dysfunction).    Doppler parameters are consistent with high ventricular filling    pressure.  - Regional wall motion abnormality: Akinesis of the apical    anterior, apical inferior, and apical myocardium.  - Aortic valve: Trileaflet; mildly thickened leaflets.  - Left atrium: The atrium was severely dilated.  - Right ventricle: The cavity size was moderately dilated. Systolic    function was reduced.  - Atrial septum: No defect or patent foramen ovale was identified.  - Systemic veins: IVC is dilated with normal respiratory variation.    Estimated CVP 8 mmHg.   Echo 2017 Study Conclusions   - Left ventricle: distal anterrior wall septal apical and inferior    apical hypokinesis The cavity size was moderately dilated. Wall    thickness was normal. Systolic function was moderately reduced.    The estimated ejection fraction was in the range of 35% to 40%.    Left ventricular diastolic function parameters were normal.  - Left atrium: The atrium was moderately dilated.  - Atrial septum: A patent foramen ovale cannot be excluded.   MPI 2008 IMPRESSION:  Abnormal stress Myoview study revealing impaired exercise capacity, substantial left ventricular dilatation with moderate to severe impairment in left ventricular systolic function in a segmental pattern and a large area of apical, inferior and anteroseptal scar without evidence for ischemia.  Other findings as noted.   Provider: Elise Benne, Larena Glassman Crowder  Laboratory Data:  High Sensitivity Troponin:   Recent Labs  Lab 08/10/21 1650 08/10/21 1838  TROPONINIHS 34* 34*     Chemistry Recent Labs  Lab 08/10/21 1650 08/11/21 0520 08/12/21 0507  NA 139 140 138  K 4.3 4.6 4.3  CL 105 108 106  CO2 _0 GLUCOSE 171* 147* 141*  BUN 85* 87* 88*  CREATININE 3.50* 3.57* 3.61*  CALCIUM 8.8* 8.6* 8.4*  GFRNONAA 19* 18* 18*  ANIONGAP _1 Recent Labs  Lab 08/12/21 0507  ALBUMIN 2.7*   Lipids  No results for input(s): CHOL, TRIG, HDL, LABVLDL, LDLCALC, CHOLHDL in the last 168 hours.  Hematology Recent Labs  Lab 08/08/21 0817 08/10/21 1650 08/12/21 0507  WBC  --  8.4 6.8  RBC  --  3.49* 2.88*  HGB 10.6* 11.4* 8.9*  HCT  --  35.3* 29.2*  MCV  --  101.1* 101.4*  MCH  --  32.7 30.9  MCHC  --  32.3 30.5  RDW  --  15.6* 15.5  PLT  --  137* 120*   Thyroid No results for input(s): TSH, FREET4 in the last 168 hours.  BNP Recent Labs  Lab 08/10/21 1652  BNP 475.0*  DDimer No results for input(s): DDIMER in the last 168 hours.   Radiology/Studies:  DG Chest 2 View  Result Date: 08/12/2021 CLINICAL DATA:  Chest pain.  Shortness of breath. EXAM: CHEST - 2 VIEW COMPARISON:  Chest XR, 08/11/2018.  CT chest, 07/30/2017. FINDINGS: Cardiomediastinal silhouette is enlarged, unchanged. Hypoinflation. Mild, streaky RIGHT basilar opacities and persistent consolidation of the LEFT lower lobe. Trace layering fluid along the minor fissure. No large pleural effusion or pneumothorax. No acute osseous abnormality. IMPRESSION: 1. Hypoinflation with persistent LEFT lower lobe consolidation, favor atelectasis though superimposed pneumonia could appear similar. 2. Mild interstitial pulmonary edema. Electronically Signed   By: Michaelle Birks M.D.   On: 08/12/2021 08:03   DG Chest 2 View  Result Date: 08/10/2021 CLINICAL DATA:  Chest pain since this morning, shortness of breath EXAM: CHEST - 2 VIEW COMPARISON:  08/07/2021 FINDINGS: Enlargement of cardiac silhouette with pulmonary vascular congestion. Increased LEFT pleural effusion and basilar atelectasis/consolidation. Remaining lungs clear. No pneumothorax or acute osseous findings. IMPRESSION: Enlargement of cardiac silhouette with pulmonary vascular congestion. Increased LEFT pleural effusion and LEFT basilar atelectasis/consolidation. Electronically Signed   By: Lavonia Dana M.D.   On: 08/10/2021 17:28   ECHOCARDIOGRAM COMPLETE  Result Date:  08/11/2021    ECHOCARDIOGRAM REPORT   Patient Name:   INEZ ROSATO [facility] Date of Exam: 08/11/2021 Medical Rec #:  [id number]         Height:       74.0 in Accession #:    [id number]        Weight:       379.8 lb Date of Birth:  07-17-1960         BSA:          2.854 m Patient Age:    63 years          BP:           134/68 mmHg Patient Gender: M                 HR:           58 bpm. Exam Location:  [name] Procedure: 2D Echo, Cardiac Doppler, Color Doppler and Intracardiac            Opacification Agent Indications:    I50.40* Unspecified combined systolic (congestive) and diastolic                 (congestive) heart failure  History:        Patient has prior history of Echocardiogram examinations, most                 recent 11/28/2020. CHF, Abnormal ECG, Arrythmias:Atrial Flutter,                 Signs/Symptoms:Bacteremia; Risk Factors:Diabetes, Sleep Apnea,                 Hypertension, Dyslipidemia and Former Smoker.  Sonographer:    Roseanna Rainbow RDCS Referring Phys: [phone] JACOB J STINSON  Sonographer Comments: Technically difficult study due to poor echo windows and patient is morbidly obese. Image acquisition challenging due to patient body habitus. IMPRESSIONS  1. Poor acoustic windows APical window is somewhat foreshortened. Severe distal inferior , inferoseptal hypokinesis; apoap. Left ventricular ejection fraction, by estimation, is 35 to 40%. The left ventricle has moderately decreased function. The left ventricular internal cavity size was severely dilated.  2. Right ventricular systolic function is normal. The right ventricular size is normal. There is normal pulmonary  artery systolic pressure.  3. The mitral valve is normal in structure. Mild mitral valve regurgitation.  4. Echobright density along closeure line of Left coronary cusp 10 x 6 mm. . The aortic valve is tricuspid. Aortic valve regurgitation is not visualized.  5. Aortic dilatation noted. There is mild dilatation of the ascending aorta, measuring  41 mm.  6. The inferior vena cava is dilated in size with <50% respiratory variability, suggesting right atrial pressure of 15 mmHg. Comparison(s): The left ventricular function is unchanged. FINDINGS  Left Ventricle: Poor acoustic windows APical window is somewhat foreshortened. Severe distal inferior , inferoseptal hypokinesis; apoap. Left ventricular ejection fraction, by estimation, is 35 to 40%. The left ventricle has moderately decreased function. Definity contrast agent was given IV to delineate the left ventricular endocardial borders. The left ventricular internal cavity size was severely dilated. There is no left ventricular hypertrophy. Right Ventricle: The right ventricular size is normal. Right vetricular wall thickness was not assessed. Right ventricular systolic function is normal. There is normal pulmonary artery systolic pressure. The tricuspid regurgitant velocity is 2.56 m/s, and with an assumed right atrial pressure of 8 mmHg, the estimated right ventricular systolic pressure is 14.4 mmHg. Left Atrium: Left atrial size was normal in size. Right Atrium: Right atrial size was normal in size. Pericardium: There is no evidence of pericardial effusion. Mitral Valve: The mitral valve is normal in structure. Mild mitral valve regurgitation. Tricuspid Valve: The tricuspid valve is normal in structure. Tricuspid valve regurgitation is trivial. Aortic Valve: Echobright density along closeure line of Left coronary cusp 10 x 6 mm. The aortic valve is tricuspid. Aortic valve regurgitation is not visualized. Pulmonic Valve: The pulmonic valve was grossly normal. Pulmonic valve regurgitation is trivial. Aorta: The aortic root is normal in size and structure and aortic dilatation noted. There is mild dilatation of the ascending aorta, measuring 41 mm. Venous: The inferior vena cava is dilated in size with less than 50% respiratory variability, suggesting right atrial pressure of 15 mmHg. IAS/Shunts: No atrial  level shunt detected by color flow Doppler.  LEFT VENTRICLE PLAX 2D LVIDd:         6.80 cm      Diastology LVIDs:         6.00 cm      LV e' medial:    3.65 cm/s LV PW:         1.00 cm      LV E/e' medial:  29.6 LV IVS:        0.80 cm      LV e' lateral:   7.22 cm/s LVOT diam:     2.70 cm      LV E/e' lateral: 15.0 LV SV:         125 LV SV Index:   44 LVOT Area:     5.73 cm  LV Volumes (MOD) LV vol d, MOD A2C: 364.0 ml LV vol d, MOD A4C: 408.0 ml LV vol s, MOD A2C: 192.0 ml LV vol s, MOD A4C: 280.0 ml LV SV MOD A2C:     172.0 ml LV SV MOD A4C:     408.0 ml LV SV MOD BP:      162.0 ml RIGHT VENTRICLE            IVC RV S prime:     7.95 cm/s  IVC diam: 3.50 cm TAPSE (M-mode): 1.4 cm LEFT ATRIUM           Index  RIGHT ATRIUM           Index LA diam:      4.80 cm 1.68 cm/m   RA Area:     17.20 cm LA Vol (A2C): 67.0 ml 23.47 ml/m  RA Volume:   41.30 ml  14.47 ml/m LA Vol (A4C): 54.7 ml 19.16 ml/m  AORTIC VALVE LVOT Vmax:   98.80 cm/s LVOT Vmean:  62.800 cm/s LVOT VTI:    0.218 m  AORTA Ao Root diam: 3.40 cm Ao Asc diam:  4.10 cm MITRAL VALVE                TRICUSPID VALVE MV Area (PHT): 5.84 cm     TR Peak grad:   26.2 mmHg MV Decel Time: 130 msec     TR Vmax:        256.00 cm/s MV E velocity: 108.00 cm/s MV A velocity: 52.00 cm/s   SHUNTS MV E/A ratio:  2.08         Systemic VTI:  0.22 m                             Systemic Diam: 2.70 cm Dorris Carnes MD Electronically signed by Dorris Carnes MD Signature Date/Time: 08/11/2021/1:53:37 PM    Final      Assessment and Plan:   Acute HFrEF ?ICM Left pleural effusion - Presented with CP and progressive SOB found to have BNP in the 400s and flat trend for troponin. CXR with left pleural effusion - long history of systolic heart failure with EF as low as 25%, possible mixed ICM And NICM - Has seen EP and deemed not the best candidate for ICD - Echo 10/2020 with LVEF 30-35% - Echo ordered for this admission - suspect BNP falsely low given obesity and CKD -  PTA torsemide 11m BID - Reports baseline weight around 385lbs, however unsure this is correct - Started on IV lasix 412mBID - poor UOP, increase to IV lasix 8029mID - continue with daily weights, strict I/Os and monitor kidney function - NO BB with bradycardia - CKD limits GDMT - continue Imdur 27m44mily and hydralazine 100mg23m  Acute respiratory failure - in the setting of acute heart failure and chronic OSA - improving with diuresis  Paroxysmal Afib/flutter - continue Xarelto for stroke ppx. - in NSR by EKG  HTN - stop amlodipine with low EF - no BB with h/o bradycardia - continue hydralazine and Imdur - IV lasix  CKD stage IV - Scr 2.8 on 2/6 - Scr on admission 3.5 - may need nephrology on board if kidney function doesn't improve - monitor with diuresis  Chest pain Elevated troponin CAD with remote stenting - reported prior stenting in 2000s - chest pain on presentation with mostly atypical features - Most recent ischemic eval was 2008 with abnormal stress test (report above) - HS trop 34>34, suspect mostly demand ischemia in the setting of acute heart failure  OSA - continue CPAP   For questions or updates, please contact [facility] Please consult [email or web address] for contact info under    Signed, Cadence H FurArlyss Repress3/2023 9:19 AM   Patient seen and examined and agree with Cadence FurthKathlen ModyC as detailed above.  In brief, the patient is a 60 y.41 male with a hx of CAD with remote stenting, chronic systolic and diastolic heart failure, ?ICM (EF 30-35%), CKD stage 4, multiple myeloma, bradycardia on BB,  chronic anemia, paroxysmal Aflutter on a/c, OSA on CPAP, obesity, HTN, HLD who presents with chest pain and SOB with concern for acute on chronic combined systolic and diastolic HF exacerbation for which Cardiology has been consulted.  Patient has known history of CAD with remote MI s/p LAD and D1 PCI. LVEF has been as low as 20-25% in 07/2019 to  30-35% on most recent TTE in 10/2020. Was deemed not a good ICD candidate by Dr. Lovena Le due to high risk of infection. Has also a history of Afib for which he has been taking xarelto.  The patient has been having difficulty with volume status as an outpatient with frequent adjustments of his torsemide. Notably on discussion today, he has been drinking about 5-6L of fluid per day which likely is contributing.   He presented on this admission with chest pain and SOB for the last several weeks.  Labs on admission notable for BNP 475, trop 34>24, Cr 3.57>3.61. TTE 08/11/21 with LVEF 35-40%, severe LV dilation, elevated filling pressures, RAP 15. Notably has a density on the aortic valve that is well circumscribed and echobright; likely calcification. RV is mild-to-moderately dilated and moderately hypokinetic on my review. He was given lasix 60m IV with 2.4L UOP.  Overall, patient appears volume up on exam with significantly elevated filling pressures on TTE. Likely has been having difficulty maintaining euvolemia as has significant intake of fluids throughout the day (5-6L) as he thought this would help his kidney. Do not suspect ACS at this time. Will increase diuresis and monitor response.  GEN: No acute distress.  Sitting in bed Neck: JVD to the ear Cardiac: RR, 2/6 systolic murmur Respiratory: Diminished but clear GI: Morbidly obese, soft MS: Trace to 1+ pitting edema in the hips; chronic venous stasis changes Neuro:  Nonfocal  Psych: Normal affect    Plan: -Increase lasix to 820mIV BID and monitor response -Likely significant fluid intake at home making it difficult to maintain euvolemia; discussed with patient today -No BB due to bradycardia -No ACE/ARB/entresto or spiro due to AKI on CKD -Continue hydralazine 1006mID, imdur 69m66mily -Continue liptor 80mg66mly -Not on ASA due to need for xarelto -Monitor I/Os and daily weights -Low Na diet  HeathGwyndolyn Kaufman

## 2021-08-12 NOTE — Progress Notes (Signed)
PROGRESS NOTE     [name], is a 61 y.o. male, DOB - 10/22/1960, TKW:[id number]  Admit date - 08/10/2021   Admitting Physician No admitting provider for patient encounter.  Outpatient Primary MD for the patient is [name], MD  LOS - 2  Chief Complaint  Patient presents with   Chest Pain        Brief Narrative:  61 year old with past medical history relevant for CAD with prior MI, multiple myeloma, systolic dysfunction CHF, morbid obesity, CKD 4, HTN, atrial flutter on chronic anticoagulation, DM2, OSA on CPAP admitted on 08/10/2021 with acute on chronic CHF exacerbation with new onset hypoxia   -Assessment and Plan:   1)HFrEF--is 35 to 40%. The left ventricle has  moderately decreased function. The left  ventricular internal cavity size was severely dilated -Increase IV Lasix to 60 40 mg twice daily -Daily weights fluid input and output monitor -Continue hydralazine/isosorbide combo in lieu of ACE/ARB due to renal concerns - diuresing well, weight trending down  2)Normocytic anemia--in the setting of CKD 4 and underlying multiple myeloma -Patient follows with [name] for Retacrit and Venofer infusion  3) CKD stage -IV   -Progressive CKD creatinine currently around 3.5 -Patient sees [name] -Attempts to aggressively diurese him is resulted in AKI on CKD previously -renally adjust medications, avoid nephrotoxic agents / dehydration  / hypotension  4) acute hypoxic respiratory failure--- secondary to 1 above -Anticipate improvement with diuresis -Repeat chest x-ray on 08/12/2021 after diuresis suggest atelectasis -able to wean patient off oxygen at this time -Hypoxia has resolved -Empiric treatment with IV Rocephin and doxycycline for possible pneumonia  5)Morbid Obesity/OSA -Low calorie diet, portion control and increase physical activity discussed with patient -Body mass index is 48.2 kg/m. -Continue CPAP nightly  6) DM2--.A1C pending Use  Novolog/Humalog Sliding scale insulin with Accu-Cheks/Fingersticks as ordered   7) chronic atrial flutter/fibrillation-rate appears controlled, continue Xarelto for stroke prophylaxis  8)HTN-continue amlodipine 5 mg daily, along with hydralazine and isosorbide  Disposition/Need for in-Hospital Stay- patient unable to be discharged at this time due to -acute systolic dysfunction CHF Patient requiring IV diuresis and supplemental oxygen and IV antibiotics for presumed pneumonia  Status is: Inpatient   Disposition: The patient is from: Home              Anticipated d/c is to: Home              Anticipated d/c date is: 2 days              Patient currently is not medically stable to d/c. Barriers: Not Clinically Stable-   Code Status :  -  Code Status: Full Code   Family Communication:    NA (patient is alert, awake and coherent)  -Discussed with wife at bedside  DVT Prophylaxis  :   - SCDs    rivaroxaban (XARELTO) tablet 20 mg   Lab Results  Component Value Date   PLT 120 (L) 08/12/2021    Inpatient Medications  Scheduled Meds:  atorvastatin  80 mg Oral QHS   furosemide  80 mg Intravenous BID   hydrALAZINE  100 mg Oral TID   insulin aspart  0-15 Units Subcutaneous TID WC   insulin aspart  0-5 Units Subcutaneous QHS   isosorbide mononitrate  30 mg Oral Daily   lipase/protease/amylase  36,000 Units Oral TID AC   potassium chloride  40 mEq Oral BID   rivaroxaban  20 mg Oral Q supper   Continuous  Infusions: PRN Meds:.acetaminophen, ondansetron **OR** ondansetron (ZOFRAN) IV   Anti-infectives (From admission, onward)    None         Subjective: [name] today has no fevers, no emesis,  No chest pain,   -Reports significant dyspnea on exertion -Hypoxia -Wife at bedside questions answered -Patient is voiding okay   Objective: Vitals:   08/11/21 2319 08/12/21 0407 08/12/21 0616 08/12/21 1408  BP: 137/77 125/71  122/65  Pulse:  (!) 54  (!) 55  Resp:  14     Temp:  97.8 F (36.6 C)  97.9 F (36.6 C)  TempSrc:  Oral  Oral  SpO2:  98%  100%  Weight:   (!) 170.3 kg   Height:        Intake/Output Summary (Last 24 hours) at 08/12/2021 1948 Last data filed at 08/12/2021 1826 Gross per 24 hour  Intake 720 ml  Output 3025 ml  Net -2305 ml   Filed Weights   08/11/21 0500 08/11/21 0849 08/12/21 0616  Weight: (!) 172.3 kg (!) 173.5 kg (!) 170.3 kg    Physical Exam  Gen:- Awake Alert, morbidly obese, significant dyspnea on exertion HEENT:- [place].AT, No sclera icterus Nose- Mono 2 to 4L/min Lungs-diminished breath sounds with bibasilar CV- S1, S2 normal, regular  Abd-  +ve B.Sounds, Abd Soft, No tenderness, increased truncal adiposity Extremity/Skin:-2+ pitting edema, pedal pulses present  Psych-affect is appropriate, oriented x3 Neuro-generalized weakness, no new focal deficits, no tremors  Data Reviewed: I have personally reviewed following labs and imaging studies  CBC: Recent Labs  Lab 08/08/21 0817 08/10/21 1650 08/12/21 0507  WBC  --  8.4 6.8  HGB 10.6* 11.4* 8.9*  HCT  --  35.3* 29.2*  MCV  --  101.1* 101.4*  PLT  --  137* 364*   Basic Metabolic Panel: Recent Labs  Lab 08/10/21 1650 08/11/21 0520 08/12/21 0507  NA 139 140 138  K 4.3 4.6 4.3  CL 105 108 106  CO2 _0 GLUCOSE 171* 147* 141*  BUN 85* 87* 88*  CREATININE 3.50* 3.57* 3.61*  CALCIUM 8.8* 8.6* 8.4*  PHOS  --   --  3.6   GFR: Estimated Creatinine Clearance: 35.2 mL/min (A) (by C-G formula based on SCr of 3.61 mg/dL (H)). Liver Function Tests: Recent Labs  Lab 08/12/21 0507  ALBUMIN 2.7*   Cardiac Enzymes: No results for input(s): CKTOTAL, CKMB, CKMBINDEX, TROPONINI in the last 168 hours. BNP (last 3 results) No results for input(s): PROBNP in the last 8760 hours. HbA1C: Recent Labs    08/10/21 1650  HGBA1C 6.4*   Sepsis Labs: _1 (procalcitonin:4,lacticidven:4) ) Recent Results (from the past 240 hour(s))  Resp Panel by RT-PCR  (Flu A&B, Covid) Nasopharyngeal Swab     Status: None   Collection Time: 08/10/21  5:38 PM   Specimen: Nasopharyngeal Swab; Nasopharyngeal(NP) swabs in vial transport medium  Result Value Ref Range Status   SARS Coronavirus 2 by RT PCR NEGATIVE NEGATIVE Final    Comment: (NOTE) SARS-CoV-2 target nucleic acids are NOT DETECTED.  The SARS-CoV-2 RNA is generally detectable in upper respiratory specimens during the acute phase of infection. The lowest concentration of SARS-CoV-2 viral copies this assay can detect is 138 copies/mL. A negative result does not preclude SARS-Cov-2 infection and should not be used as the sole basis for treatment or other patient management decisions. A negative result may occur with  improper specimen collection/handling, submission of specimen other than nasopharyngeal swab, presence of viral mutation(s)  within the areas targeted by this assay, and inadequate number of viral copies(<138 copies/mL). A negative result must be combined with clinical observations, patient history, and epidemiological information. The expected result is Negative.  Fact Sheet for Patients:  EntrepreneurPulse.com.au  Fact Sheet for Healthcare Providers:  IncredibleEmployment.be  This test is no t yet approved or cleared by the [place] FDA and  has been authorized for detection and/or diagnosis of SARS-CoV-2 by FDA under an Emergency Use Authorization (EUA). This EUA will remain  in effect (meaning this test can be used) for the duration of the COVID-19 declaration under Section 564(b)(1) of the Act, 21 U.S.C.section 360bbb-3(b)(1), unless the authorization is terminated  or revoked sooner.       Influenza A by PCR NEGATIVE NEGATIVE Final   Influenza B by PCR NEGATIVE NEGATIVE Final    Comment: (NOTE) The Xpert Xpress SARS-CoV-2/FLU/RSV plus assay is intended as an aid in the diagnosis of influenza from Nasopharyngeal swab specimens  and should not be used as a sole basis for treatment. Nasal washings and aspirates are unacceptable for Xpert Xpress SARS-CoV-2/FLU/RSV testing.  Fact Sheet for Patients: EntrepreneurPulse.com.au  Fact Sheet for Healthcare Providers: IncredibleEmployment.be  This test is not yet approved or cleared by the [place] FDA and has been authorized for detection and/or diagnosis of SARS-CoV-2 by FDA under an Emergency Use Authorization (EUA). This EUA will remain in effect (meaning this test can be used) for the duration of the COVID-19 declaration under Section 564(b)(1) of the Act, 21 U.S.C. section 360bbb-3(b)(1), unless the authorization is terminated or revoked.  Performed at [facility], [address] [place], [place] [address]       Radiology Studies: DG Chest 2 View  Result Date: 08/12/2021 CLINICAL DATA:  Chest pain.  Shortness of breath. EXAM: CHEST - 2 VIEW COMPARISON:  Chest XR, 08/11/2018.  CT chest, 07/30/2017. FINDINGS: Cardiomediastinal silhouette is enlarged, unchanged. Hypoinflation. Mild, streaky RIGHT basilar opacities and persistent consolidation of the LEFT lower lobe. Trace layering fluid along the minor fissure. No large pleural effusion or pneumothorax. No acute osseous abnormality. IMPRESSION: 1. Hypoinflation with persistent LEFT lower lobe consolidation, favor atelectasis though superimposed pneumonia could appear similar. 2. Mild interstitial pulmonary edema. Electronically Signed   By: Michaelle Birks M.D.   On: 08/12/2021 08:03   ECHOCARDIOGRAM COMPLETE  Result Date: 08/11/2021    ECHOCARDIOGRAM REPORT   Patient Name:   WERNER LABELLA [facility] Date of Exam: 08/11/2021 Medical Rec #:  [id number]         Height:       74.0 in Accession #:    [id number]        Weight:       379.8 lb Date of Birth:  [birth year]         BSA:          2.854 m Patient Age:    19 years          BP:           134/68 mmHg Patient Gender: M                 HR:            58 bpm. Exam Location:  [name] Procedure: 2D Echo, Cardiac Doppler, Color Doppler and Intracardiac            Opacification Agent Indications:    I50.40* Unspecified combined systolic (congestive) and diastolic                 (  congestive) heart failure  History:        Patient has prior history of Echocardiogram examinations, most                 recent 11/28/2020. CHF, Abnormal ECG, Arrythmias:Atrial Flutter,                 Signs/Symptoms:Bacteremia; Risk Factors:Diabetes, Sleep Apnea,                 Hypertension, Dyslipidemia and Former Smoker.  Sonographer:    Roseanna Rainbow RDCS Referring Phys: [phone] JACOB J STINSON  Sonographer Comments: Technically difficult study due to poor echo windows and patient is morbidly obese. Image acquisition challenging due to patient body habitus. IMPRESSIONS  1. Poor acoustic windows APical window is somewhat foreshortened. Severe distal inferior , inferoseptal hypokinesis; apoap. Left ventricular ejection fraction, by estimation, is 35 to 40%. The left ventricle has moderately decreased function. The left ventricular internal cavity size was severely dilated.  2. Right ventricular systolic function is normal. The right ventricular size is normal. There is normal pulmonary artery systolic pressure.  3. The mitral valve is normal in structure. Mild mitral valve regurgitation.  4. Echobright density along closeure line of Left coronary cusp 10 x 6 mm. . The aortic valve is tricuspid. Aortic valve regurgitation is not visualized.  5. Aortic dilatation noted. There is mild dilatation of the ascending aorta, measuring 41 mm.  6. The inferior vena cava is dilated in size with <50% respiratory variability, suggesting right atrial pressure of 15 mmHg. Comparison(s): The left ventricular function is unchanged. FINDINGS  Left Ventricle: Poor acoustic windows APical window is somewhat foreshortened. Severe distal inferior , inferoseptal hypokinesis; apoap. Left ventricular  ejection fraction, by estimation, is 35 to 40%. The left ventricle has moderately decreased function. Definity contrast agent was given IV to delineate the left ventricular endocardial borders. The left ventricular internal cavity size was severely dilated. There is no left ventricular hypertrophy. Right Ventricle: The right ventricular size is normal. Right vetricular wall thickness was not assessed. Right ventricular systolic function is normal. There is normal pulmonary artery systolic pressure. The tricuspid regurgitant velocity is 2.56 m/s, and with an assumed right atrial pressure of 8 mmHg, the estimated right ventricular systolic pressure is 42.3 mmHg. Left Atrium: Left atrial size was normal in size. Right Atrium: Right atrial size was normal in size. Pericardium: There is no evidence of pericardial effusion. Mitral Valve: The mitral valve is normal in structure. Mild mitral valve regurgitation. Tricuspid Valve: The tricuspid valve is normal in structure. Tricuspid valve regurgitation is trivial. Aortic Valve: Echobright density along closeure line of Left coronary cusp 10 x 6 mm. The aortic valve is tricuspid. Aortic valve regurgitation is not visualized. Pulmonic Valve: The pulmonic valve was grossly normal. Pulmonic valve regurgitation is trivial. Aorta: The aortic root is normal in size and structure and aortic dilatation noted. There is mild dilatation of the ascending aorta, measuring 41 mm. Venous: The inferior vena cava is dilated in size with less than 50% respiratory variability, suggesting right atrial pressure of 15 mmHg. IAS/Shunts: No atrial level shunt detected by color flow Doppler.  LEFT VENTRICLE PLAX 2D LVIDd:         6.80 cm      Diastology LVIDs:         6.00 cm      LV e' medial:    3.65 cm/s LV PW:         1.00 cm  LV E/e' medial:  29.6 LV IVS:        0.80 cm      LV e' lateral:   7.22 cm/s LVOT diam:     2.70 cm      LV E/e' lateral: 15.0 LV SV:         125 LV SV Index:   44 LVOT  Area:     5.73 cm  LV Volumes (MOD) LV vol d, MOD A2C: 364.0 ml LV vol d, MOD A4C: 408.0 ml LV vol s, MOD A2C: 192.0 ml LV vol s, MOD A4C: 280.0 ml LV SV MOD A2C:     172.0 ml LV SV MOD A4C:     408.0 ml LV SV MOD BP:      162.0 ml RIGHT VENTRICLE            IVC RV S prime:     7.95 cm/s  IVC diam: 3.50 cm TAPSE (M-mode): 1.4 cm LEFT ATRIUM           Index        RIGHT ATRIUM           Index LA diam:      4.80 cm 1.68 cm/m   RA Area:     17.20 cm LA Vol (A2C): 67.0 ml 23.47 ml/m  RA Volume:   41.30 ml  14.47 ml/m LA Vol (A4C): 54.7 ml 19.16 ml/m  AORTIC VALVE LVOT Vmax:   98.80 cm/s LVOT Vmean:  62.800 cm/s LVOT VTI:    0.218 m  AORTA Ao Root diam: 3.40 cm Ao Asc diam:  4.10 cm MITRAL VALVE                TRICUSPID VALVE MV Area (PHT): 5.84 cm     TR Peak grad:   26.2 mmHg MV Decel Time: 130 msec     TR Vmax:        256.00 cm/s MV E velocity: 108.00 cm/s MV A velocity: 52.00 cm/s   SHUNTS MV E/A ratio:  2.08         Systemic VTI:  0.22 m                             Systemic Diam: 2.70 cm Dorris Carnes MD Electronically signed by Dorris Carnes MD Signature Date/Time: 08/11/2021/1:53:37 PM    Final      Scheduled Meds:  atorvastatin  80 mg Oral QHS   furosemide  80 mg Intravenous BID   hydrALAZINE  100 mg Oral TID   insulin aspart  0-15 Units Subcutaneous TID WC   insulin aspart  0-5 Units Subcutaneous QHS   isosorbide mononitrate  30 mg Oral Daily   lipase/protease/amylase  36,000 Units Oral TID AC   potassium chloride  40 mEq Oral BID   rivaroxaban  20 mg Oral Q supper   Continuous Infusions:   LOS: 2 days    Roxan Hockey M.D on 08/12/2021 at 7:48 PM  Go to [email or web address] - for contact info  [facility] - Office  [phone]  If 7PM-7AM, please contact night-coverage [email or web address] Password [facility] 08/12/2021, 7:48 PM

## 2021-08-13 DIAGNOSIS — I5041 Acute combined systolic (congestive) and diastolic (congestive) heart failure: Secondary | ICD-10-CM | POA: Diagnosis not present

## 2021-08-13 DIAGNOSIS — C9001 Multiple myeloma in remission: Secondary | ICD-10-CM | POA: Diagnosis not present

## 2021-08-13 DIAGNOSIS — E118 Type 2 diabetes mellitus with unspecified complications: Secondary | ICD-10-CM | POA: Diagnosis not present

## 2021-08-13 DIAGNOSIS — G4733 Obstructive sleep apnea (adult) (pediatric): Secondary | ICD-10-CM | POA: Diagnosis not present

## 2021-08-13 DIAGNOSIS — I4892 Unspecified atrial flutter: Secondary | ICD-10-CM | POA: Diagnosis not present

## 2021-08-13 DIAGNOSIS — N1832 Chronic kidney disease, stage 3b: Secondary | ICD-10-CM

## 2021-08-13 LAB — RENAL FUNCTION PANEL
Albumin: 2.8 g/dL — ABNORMAL LOW (ref 3.5–5.0)
Anion gap: 10 (ref 5–15)
BUN: 88 mg/dL — ABNORMAL HIGH (ref 8–23)
CO2: 24 mmol/L (ref 22–32)
Calcium: 8.3 mg/dL — ABNORMAL LOW (ref 8.9–10.3)
Chloride: 108 mmol/L (ref 98–111)
Creatinine, Ser: 3.75 mg/dL — ABNORMAL HIGH (ref 0.61–1.24)
GFR, Estimated: 17 mL/min — ABNORMAL LOW (ref >60)
Glucose, Bld: 142 mg/dL — ABNORMAL HIGH (ref 70–99)
Phosphorus: 4 mg/dL (ref 2.5–4.6)
Potassium: 4.3 mmol/L (ref 3.5–5.1)
Sodium: 142 mmol/L (ref 135–145)

## 2021-08-13 LAB — GLUCOSE, CAPILLARY
Glucose-Capillary: 139 mg/dL — ABNORMAL HIGH (ref 70–99)
Glucose-Capillary: 169 mg/dL — ABNORMAL HIGH (ref 70–99)
Glucose-Capillary: 144 mg/dL — ABNORMAL HIGH (ref 70–99)
Glucose-Capillary: 158 mg/dL — ABNORMAL HIGH (ref 70–99)

## 2021-08-13 LAB — CBC
HCT: 29.2 % — ABNORMAL LOW (ref 39.0–52.0)
Hemoglobin: 9 g/dL — ABNORMAL LOW (ref 13.0–17.0)
MCH: 31.6 pg (ref 26.0–34.0)
MCHC: 30.8 g/dL (ref 30.0–36.0)
MCV: 102.5 fL — ABNORMAL HIGH (ref 80.0–100.0)
Platelets: 137 10*3/uL — ABNORMAL LOW (ref 150–400)
RBC: 2.85 MIL/uL — ABNORMAL LOW (ref 4.22–5.81)
RDW: 15.2 % (ref 11.5–15.5)
WBC: 6.2 10*3/uL (ref 4.0–10.5)
nRBC: 0 % (ref 0.0–0.2)

## 2021-08-13 MED ORDER — DARBEPOETIN ALFA 100 MCG/0.5ML IJ SOSY
100.0000 ug | PREFILLED_SYRINGE | Freq: Once | INTRAMUSCULAR | Status: AC
  Administered 2021-08-13: 100 ug via SUBCUTANEOUS
  Filled 2021-08-13: qty 0.5

## 2021-08-13 MED ORDER — RIVAROXABAN 15 MG PO TABS
15.0000 mg | ORAL_TABLET | Freq: Every day | ORAL | Status: DC
  Administered 2021-08-13 – 2021-08-14 (×2): 15 mg via ORAL
  Filled 2021-08-13 (×2): qty 1

## 2021-08-13 MED ORDER — SODIUM CHLORIDE 0.9 % IV SOLN
250.0000 mg | Freq: Every day | INTRAVENOUS | Status: DC
  Administered 2021-08-13 – 2021-08-15 (×3): 250 mg via INTRAVENOUS
  Filled 2021-08-13 (×4): qty 20

## 2021-08-13 NOTE — Consult Note (Signed)
[facility] ?Renal Consultation Note ? ?Requesting MD: Denton Brick ?Indication for Consultation: A on CRF ? ?HPI:  ?[name] is a 61 y.o. male with past medical history significant for DM, HTN, OSA on CPAP, ischemic cardiomyopathy-  EF 30-35%, A fib/flutter on AC, gout, history of multiple myeloma followed by heme- s/p treatment - now in remission.  He also appears to have progressive CKD since 2018- of late creatinine baseline around 2.7 followed regularly by [name]-  last seen on 08/07/21 -  he did not think was volume overloaded-  crt was 3.9 -  diuretics were decreased and he reports he was told to push fluid which he did. He presented to the [facility] on 3/11 with c/o SOB and DOE also leg edema and chest pain.  He was admitted and started on IV diuretics-  presenting crt 3.5-  now 3.75 we are asked to consult.  Good UOP recorded -  weight is down 10 kg.  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 ?Creat  ?Date/Time Value Ref Range Status  ?10/05/2012 02:11 PM 2.24 (H) 0.50 - 1.35 mg/dL Final  ?09/17/2012 04:13 PM 1.82 (H) 0.50 - 1.35 mg/dL Final  ?10/20/2011 05:58 PM 1.60 (H) 0.50 - 1.35 mg/dL Final  ? ?Creatinine, Ser  ?Date/Time Value Ref Range Status  ?08/13/2021 04:47 AM 3.75 (H) 0.61 - 1.24 mg/dL Final  ?08/12/2021 05:07 AM 3.61 (H) 0.61 - 1.24 mg/dL Final  ?08/11/2021 05:20 AM 3.57 (H) 0.61 - 1.24 mg/dL Final  ?08/10/2021 04:50 PM 3.50 (H) 0.61 - 1.24 mg/dL Final  ?07/08/2021 09:25 AM 2.80 (H) 0.76 - 1.27 mg/dL Final  ?12/13/2019 02:34 PM 2.78 (H) 0.61 - 1.24 mg/dL Final  ?06/10/2019 03:11 PM 2.26 (H) 0.61 - 1.24 mg/dL Final  ?02/01/2019 03:51 PM 2.38 (H) 0.61 - 1.24 mg/dL Final  ?10/01/2018 11:44 AM 2.11 (H) 0.61 - 1.24 mg/dL Final  ?07/05/2018 03:54 PM 2.00 (H) 0.61 - 1.24 mg/dL Final  ?07/05/2018 03:54 PM 1.99 (H) 0.61 - 1.24 mg/dL Final  ?02/25/2018 03:52 PM 1.76 (H) 0.61 - 1.24 mg/dL Final  ?01/11/2018 03:36 PM 1.62 (H) 0.61  - 1.24 mg/dL Final  ?12/21/2017 06:23 AM 1.49 (H) 0.61 - 1.24 mg/dL Final  ?12/20/2017 05:55 AM 1.59 (H) 0.61 - 1.24 mg/dL Final  ?12/19/2017 05:35 AM 1.74 (H) 0.61 - 1.24 mg/dL Final  ?12/18/2017 06:27 AM 1.86 (H) 0.61 - 1.24 mg/dL Final  ?12/17/2017 01:23 PM 2.11 (H) 0.61 - 1.24 mg/dL Final  ?12/11/2017 09:40 AM 1.80 (H) 0.61 - 1.24 mg/dL Final  ?12/11/2017 08:07 AM 1.74 (H) 0.61 - 1.24 mg/dL Final  ?11/12/2017 05:50 AM 1.62 (H) 0.61 - 1.24 mg/dL Final  ?11/11/2017 06:17 AM 1.84 (H) 0.61 - 1.24 mg/dL Final  ?11/10/2017 08:10 AM 1.79 (H) 0.61 - 1.24 mg/dL Final  ?11/09/2017 05:13 PM 1.54 (H) 0.61 - 1.24 mg/dL Final  ?10/26/2017 12:05 PM 1.52 (H) 0.61 - 1.24 mg/dL Final  ?10/15/2017 01:00 PM 1.51 (H) 0.61 - 1.24 mg/dL Final  ?10/07/2017 04:21 AM 1.34 (H) 0.61 - 1.24 mg/dL Final  ?10/06/2017 04:53 AM 1.47 (H) 0.61 - 1.24 mg/dL Final  ?10/05/2017 05:46 AM 1.59 (H) 0.61 - 1.24 mg/dL Final  ?10/04/2017 07:29 AM 2.02 (H) 0.61 - 1.24 mg/dL Final  ?10/03/2017 02:28 AM 2.18 (H) 0.61 - 1.24 mg/dL Final  ?10/02/2017 01:34 PM 2.41 (H) 0.61 - 1.24 mg/dL Final  ?10/02/2017 04:30 AM 2.80 (H) 0.61 - 1.24  mg/dL Final  ?10/01/2017 02:57 AM 3.44 (H) 0.61 - 1.24 mg/dL Final  ?09/30/2017 05:32 PM 3.48 (H) 0.61 - 1.24 mg/dL Final  ?08/14/2017 08:33 AM 1.54 (H) 0.61 - 1.24 mg/dL Final  ?08/13/2017 01:09 PM 1.56 (H) 0.61 - 1.24 mg/dL Final  ?08/11/2017 08:53 AM 1.58 (H) 0.61 - 1.24 mg/dL Final  ?08/04/2017 06:50 AM 1.56 (H) 0.61 - 1.24 mg/dL Final  ?08/03/2017 05:31 AM 1.76 (H) 0.61 - 1.24 mg/dL Final  ?08/02/2017 07:54 AM 2.20 (H) 0.61 - 1.24 mg/dL Final  ?08/01/2017 04:18 AM 2.87 (H) 0.61 - 1.24 mg/dL Final  ?07/31/2017 04:45 AM 3.29 (H) 0.61 - 1.24 mg/dL Final  ?07/30/2017 05:23 PM 3.81 (H) 0.61 - 1.24 mg/dL Final  ?07/16/2017 03:09 PM 2.21 (H) 0.61 - 1.24 mg/dL Final  ?06/15/2017 03:36 PM 1.54 (H) 0.61 - 1.24 mg/dL Final  ?04/22/2017 04:49 AM 1.62 (H) 0.61 - 1.24 mg/dL Final  ?04/21/2017 10:45 AM 1.58 (H) 0.61 - 1.24 mg/dL Final   ?04/19/2017 04:42 AM 1.84 (H) 0.61 - 1.24 mg/dL Final  ?04/18/2017 04:03 AM 2.08 (H) 0.61 - 1.24 mg/dL Final  ?04/17/2017 10:45 AM 1.99 (H) 0.61 - 1.24 mg/dL Final  ?04/16/2017 11:45 AM 2.18 (H) 0.61 - 1.24 mg/dL Final  ? ? ? ?PMHx: ?  ?Past Medical History:  ?Diagnosis Date  ? Abscess 04/2017  ? Scrotal  ? Anemia   ? Arteriosclerotic cardiovascular disease (ASCVD)   ? MI-2000s; stent to the proximal LAD and diagonal in 2001; stress nuclear in 2008-impaired exercise capacity, left ventricular dilatation, moderately to severely depressed EF, apical, inferior and anteroseptal scar  ? Arthritis   ? Atrial flutter ([place])   ? Bence-Jones proteinuria 05/05/2011  ? Cellulitis of leg   ? both legs  ? Chronic diarrhea   ? Chronic kidney disease, stage 3, mod decreased GFR (HCC)   ? Creatinine of 1.84 in 06/2011 and 1.5 in 07/2011  ? Diabetes mellitus   ? Insulin  ? Dysrhythmia   ? AFlutter  ? GERD (gastroesophageal reflux disease)   ? Gout   ? Hyperlipidemia   ? Hypertension   ? Injection site reaction   ? Multiple myeloma 07/01/2011  ? Myocardial infarction [facility]) 2000  ? Obesity   ? Pedal edema   ? Venous insufficiency  ? Sleep apnea   ? uses cpap  ? Ulcer   ? ? ?Past Surgical History:  ?Procedure Laterality Date  ? ABSCESS DRAINAGE    ? Scrotal  ? BIOPSY  01/02/2012  ? Procedure: BIOPSY;  Surgeon: Rogene Houston, MD;  [facility];  Service: Endoscopy;  Laterality: N/A;  ? BONE MARROW BIOPSY  05/13/11  ? CARDIAC CATHETERIZATION    ? cardiac stent  ? CARDIOVERSION N/A 10/13/2012  ? Procedure: CARDIOVERSION;  Surgeon: Yehuda Savannah, MD;  Location: [facility];  Service: Cardiovascular;  Laterality: N/A;  ? CATARACT EXTRACTION W/PHACO Left 02/13/2014  ? Procedure: CATARACT EXTRACTION PHACO AND INTRAOCULAR LENS PLACEMENT (IOC);  Surgeon: Tonny Branch, MD;  Location: [facility];  Service: Ophthalmology;  Laterality: Left;  CDE:  7.67  ? CATARACT EXTRACTION W/PHACO Right 03/02/2014  ? Procedure: CATARACT EXTRACTION PHACO AND  INTRAOCULAR LENS PLACEMENT RIGHT EYE CDE=16.81;  Surgeon: Tonny Branch, MD;  Location: [facility];  Service: Ophthalmology;  Laterality: Right;  ? COLONOSCOPY  11/28/2011  ? Procedure: COLONOSCOPY;  Surgeon: Rogene Houston, MD;  [facility];  Service: Endoscopy;  Laterality: N/A;  930  ? CYSTOSCOPY W/ RETROGRADES Right 12/16/2017  ?  Procedure: CYSTOSCOPY WITH RIGHT RETROGRADE PYELOGRAM AND RIGHT URETERAL STENT REMOVAL;  Surgeon: Cleon Gustin, MD;  Location: [facility];  Service: Urology;  Laterality: Right;  ? CYSTOSCOPY W/ URETERAL STENT PLACEMENT Right 07/31/2017  ? Procedure: CYSTOSCOPY WITH RETROGRADE PYELOGRAM/URETERAL STENT PLACEMENT;  Surgeon: Cleon Gustin, MD;  Location: [facility];  Service: Urology;  Laterality: Right;  ? ESOPHAGOGASTRODUODENOSCOPY  01/02/2012  ? Procedure: ESOPHAGOGASTRODUODENOSCOPY (EGD);  Surgeon: Rogene Houston, MD;  [facility];  Service: Endoscopy;  Laterality: N/A;  100  ? ESOPHAGOGASTRODUODENOSCOPY N/A 09/20/2012  ? Procedure: ESOPHAGOGASTRODUODENOSCOPY (EGD);  Surgeon: Rogene Houston, MD;  [facility];  Service: Endoscopy;  Laterality: N/A;  ? EUS N/A 10/07/2012  ? Procedure: UPPER ENDOSCOPIC ULTRASOUND (EUS) LINEAR;  Surgeon: Milus Banister, MD;  [facility];  Service: Endoscopy;  Laterality: N/A;  ? INCISION AND DRAINAGE ABSCESS N/A 04/14/2017  ? Procedure: INCISION AND DRAINAGE ABSCESS;  Surgeon: Ceasar Mons, MD;  Location: [facility];  Service: Urology;  Laterality: N/A;  ? INCISION AND DRAINAGE ABSCESS ANAL    ? IRRIGATION AND DEBRIDEMENT ABSCESS N/A 04/15/2017  ? Procedure: DEBRIDEMENT SCROTAL WOUND AND DRESSING CHANGE;  Surgeon: Ceasar Mons, MD;  Location: [facility];  Service: Urology;  Laterality: N/A;  ? IRRIGATION AND DEBRIDEMENT ABSCESS N/A 04/17/2017  ? Procedure: IRRIGATION AND DEBRIDEMENT ABSCESS;  Surgeon: Ceasar Mons, MD;  Location: [facility];  Service: Urology;  Laterality: N/A;  RM 3  ?  LAPAROSCOPIC GASTRIC BANDING  2006  ? has been removed  ? OSTECTOMY Right 04/08/2017  ? Procedure: OSTECTOMY RIGHT GREAT TOE;  Surgeon: Caprice Beaver, DPM;  Location: [facility];  Service: Podiatry;  Laterality: Rig

## 2021-08-13 NOTE — Progress Notes (Addendum)
?PROGRESS NOTE ? ? ? ? ?[name], is a 61 y.o. male, DOB - 1960/12/16, ERX:[id number] ? ?Admit date - 08/10/2021   Admitting Physician No admitting provider for patient encounter. ? ?Outpatient Primary MD for the patient is Franklin Beard, MD ? ?LOS - 3 ? ?Chief Complaint  ?Patient presents with  ? Chest Pain  ?    ? ? ?Brief Narrative:  ?61 year old with past medical history relevant for CAD with prior MI, multiple myeloma, systolic dysfunction CHF, morbid obesity, CKD 4, HTN, atrial flutter on chronic anticoagulation, DM2, OSA on CPAP admitted on 08/10/2021 with acute on chronic CHF exacerbation with new onset hypoxia ?  ?-Assessment and Plan: ? ? ?1)HFrEF--acute on chronic systolic dysfunction CHF exacerbation with significant volume overload in the setting of worsening kidney function and increased fluid intake PTA ?- EF is 35 to 40%. -Increased IV Lasix to 80 mg twice daily ?-Daily weights fluid input and output monitor ?-Continue hydralazine/isosorbide combo in lieu of ACE/ARB/ARNI due to renal concerns ?- diuresing well, weight trending down (Wt is down toi 372 Lb from 382 pounds on 08/11/21) ?-Repeat BNP and chest x-ray in a.m. ?Pt uses Lymphedema pumps at home--- ? ?2) chronic normocytic anemia--in the setting of CKD 4 and underlying multiple myeloma ?-Patient follows with Dr. Delton Waller for Retacrit and Venofer infusion ?-Nephrology input for ESA and iron infusion appreciated ?-Repeat CBC in a.m. ? ?3) AKI on CKD stage -IV   ?-Progressive CKD creatinine currently around 3.5, creatinine is trending up very slightly ?-Patient sees Dr. Theador Waller ?-Attempts to aggressively diurese him is resulted in AKI on CKD previously ?-Nephrology consult from [name] appreciated ?-renally adjust medications, avoid nephrotoxic agents / dehydration  / hypotension ? ?4) acute hypoxic respiratory failure--- secondary to 1 above ?-Anticipate improvement with diuresis ?-Repeat chest x-ray on 08/12/2021 after diuresis  suggest atelectasis -able to wean patient off oxygen at this time ?-Hypoxia has resolved ?-Empiric treatment with IV Rocephin and doxycycline for possible pneumonia ?-Repeat chest x-ray on 08/14/2021 ? ?5)Morbid Obesity/OSA ?-Low calorie diet, portion control and increase physical activity discussed with patient ?-Body mass index is 47.85 kg/m?. ?-Continue CPAP nightly ? ?6) DM2--.A1C 6.4 reflecting excellent diabetic control PTA ?Use Novolog/Humalog Sliding scale insulin with Accu-Cheks/Fingersticks as ordered  ? ?7) chronic atrial flutter/fibrillation-rate appears controlled, continue Xarelto for stroke prophylaxis ?-Patient has baseline bradycardia so no beta-blocker ? ?8)HTN-discontinue amlodipine in setting of low EF  ?-continue with hydralazine and isosorbide ?avoid ACEI/ARB/ARNI due to renal concerns ? ? ?Disposition/Need for in-Hospital Stay- patient unable to be discharged at this time due to -acute systolic dysfunction CHF ?Patient requiring IV diuresis and supplemental oxygen and IV antibiotics for presumed pneumonia ?-Possible discharge home in 1 to 2 days if continues to diurese well, repeat chest x-ray pending 08/14/2021 ? ?Status is: Inpatient  ? ?Disposition: The patient is from: Home ?             Anticipated d/c is to: Home ?             Anticipated d/c date is: 1 day ?             Patient currently is not medically stable to d/c. ?Barriers: Not Clinically Stable-  ? ?Code Status :  -  Code Status: Full Code  ? ?Family Communication:    (patient is alert, awake and coherent)  ?-Discussed with wife at bedside ? ?DVT Prophylaxis  :   - SCDs    ?Rivaroxaban (XARELTO) tablet 15 mg  ? ?  Lab Results  ?Component Value Date  ? PLT 137 (L) 08/13/2021  ? ? ?Inpatient Medications ? ?Scheduled Meds: ? atorvastatin  80 mg Oral QHS  ? doxycycline  100 mg Oral Q12H  ? furosemide  80 mg Intravenous BID  ? hydrALAZINE  100 mg Oral TID  ? insulin aspart  0-15 Units Subcutaneous TID WC  ? insulin aspart  0-5 Units  Subcutaneous QHS  ? isosorbide mononitrate  30 mg Oral Daily  ? lipase/protease/amylase  36,000 Units Oral TID AC  ? potassium chloride  40 mEq Oral BID  ? rivaroxaban  15 mg Oral Q supper  ? ?Continuous Infusions: ? cefTRIAXone (ROCEPHIN)  IV 2 g (08/12/21 2314)  ? ferric gluconate (FERRLECIT) IVPB 250 mg (08/13/21 1532)  ? ?PRN Meds:.acetaminophen, ondansetron **OR** ondansetron (ZOFRAN) IV ? ? ?Anti-infectives (From admission, onward)  ? ? Start     Dose/Rate Route Frequency Ordered Stop  ? 08/12/21 2200  doxycycline (VIBRA-TABS) tablet 100 mg       ? 100 mg Oral Every 12 hours 08/12/21 1954    ? 08/12/21 2100  cefTRIAXone (ROCEPHIN) 2 g in sodium chloride 0.9 % 100 mL IVPB       ? 2 g ?200 mL/hr over 30 Minutes Intravenous Every 24 hours 08/12/21 1954    ? ?  ? ?  ? ?Subjective: ?[name] today has no fevers, no emesis,  No chest pain,   ?- ?No dyspnea at rest ?-Dyspnea on exertion improving slowly ?-Voiding well and weight trending down ? ? ? ?Objective: ?Vitals:  ? 08/12/21 1408 08/12/21 2138 08/13/21 0500 08/13/21 1414  ?BP: 122/65 136/69 132/72 131/71  ?Pulse: (!) 55 64 65 (!) 58  ?Resp:  _0 ?Temp: 97.9 ?F (36.6 ?C) 97.9 ?F (36.6 ?C) 98 ?F (36.7 ?C)   ?TempSrc: Oral Oral Oral   ?SpO2: 100% 98% 98% 99%  ?Weight:   (!) 169.1 kg   ?Height:      ? ? ?Intake/Output Summary (Last 24 hours) at 08/13/2021 1730 ?Last data filed at 08/13/2021 1600 ?Gross per 24 hour  ?Intake 996.73 ml  ?Output 2975 ml  ?Net -1978.27 ml  ? ?Filed Weights  ? 08/11/21 0849 08/12/21 0616 08/13/21 0500  ?Weight: (!) 173.5 kg (!) 170.3 kg (!) 169.1 kg  ? ? ?Physical Exam ? ?Gen:- Awake Alert, morbidly obese, significant dyspnea on exertion ?HEENT:- [place].AT, No sclera icterus ?Lungs--improving air movement, no wheeze ?CV- S1, S2 normal, regular  ?Abd-  +ve B.Sounds, Abd Soft, No tenderness, increased truncal adiposity ?Extremity/Skin:-1+ pitting edema, pedal pulses present  ?Psych-affect is appropriate, oriented  x3 ?Neuro-generalized weakness, no new focal deficits, no tremors ? ?Data Reviewed: I have personally reviewed following labs and imaging studies ? ?CBC: ?Recent Labs  ?Lab 08/08/21 ?9735 08/10/21 ?1650 08/12/21 ?0507 08/13/21 ?3299  ?WBC  --  8.4 6.8 6.2  ?HGB 10.6* 11.4* 8.9* 9.0*  ?HCT  --  35.3* 29.2* 29.2*  ?MCV  --  101.1* 101.4* 102.5*  ?PLT  --  137* 120* 137*  ? ?Basic Metabolic Panel: ?Recent Labs  ?Lab 08/10/21 ?1650 08/11/21 ?2426 08/12/21 ?0507 08/13/21 ?8341  ?NA 139 140 138 142  ?K 4.3 4.6 4.3 4.3  ?CL 105 108 106 108  ?CO2 _1 ?GLUCOSE 171* 147* 141* 142*  ?BUN 85* 87* 88* 88*  ?CREATININE 3.50* 3.57* 3.61* 3.75*  ?CALCIUM 8.8* 8.6* 8.4* 8.3*  ?PHOS  --   --  3.6 4.0  ? ?GFR: ?  Estimated Creatinine Clearance: 33.8 mL/min (A) (by C-G formula based on SCr of 3.75 mg/dL (H)). ?Liver Function Tests: ?Recent Labs  ?Lab 08/12/21 ?0507 08/13/21 ?6579  ?ALBUMIN 2.7* 2.8*  ? ?Cardiac Enzymes: ?No results for input(s): CKTOTAL, CKMB, CKMBINDEX, TROPONINI in the last 168 hours. ?BNP (last 3 results) ?No results for input(s): PROBNP in the last 8760 hours. ?HbA1C: ?Recent Labs  ?  08/10/21 ?1650  ?HGBA1C 6.4*  ? ?Sepsis Labs: ?_0 (procalcitonin:4,lacticidven:4) ?) ?Recent Results (from the past 240 hour(s))  ?Resp Panel by RT-PCR (Flu A&B, Covid) Nasopharyngeal Swab     Status: None  ? Collection Time: 08/10/21  5:38 PM  ? Specimen: Nasopharyngeal Swab; Nasopharyngeal(NP) swabs in vial transport medium  ?Result Value Ref Range Status  ? SARS Coronavirus 2 by RT PCR NEGATIVE NEGATIVE Final  ?  Comment: (NOTE) ?SARS-CoV-2 target nucleic acids are NOT DETECTED. ? ?The SARS-CoV-2 RNA is generally detectable in upper respiratory ?specimens during the acute phase of infection. The lowest ?concentration of SARS-CoV-2 viral copies this assay can detect is ?138 copies/mL. A negative result does not preclude SARS-Cov-2 ?infection and should not be used as the sole basis for treatment or ?other patient  management decisions. A negative result may occur with  ?improper specimen collection/handling, submission of specimen other ?than nasopharyngeal swab, presence of viral mutation(s) within the ?areas targeted by this assay, and inadequ

## 2021-08-13 NOTE — Progress Notes (Addendum)
? ?Progress Note ? ?Patient Name: [name] ?Date of Encounter: 08/13/2021 ? ?[facility] Cardiologist: Kate Sable, MD (Inactive)  ? ?Subjective  ? ?UOP -3.6. kidney function is stable. Patient reports breathing is improving. No chest pain.  ? ?Inpatient Medications  ?  ?Scheduled Meds: ? atorvastatin  80 mg Oral QHS  ? doxycycline  100 mg Oral Q12H  ? furosemide  80 mg Intravenous BID  ? hydrALAZINE  100 mg Oral TID  ? insulin aspart  0-15 Units Subcutaneous TID WC  ? insulin aspart  0-5 Units Subcutaneous QHS  ? isosorbide mononitrate  30 mg Oral Daily  ? lipase/protease/amylase  36,000 Units Oral TID AC  ? potassium chloride  40 mEq Oral BID  ? rivaroxaban  20 mg Oral Q supper  ? ?Continuous Infusions: ? cefTRIAXone (ROCEPHIN)  IV 2 g (08/12/21 2314)  ? ?PRN Meds: ?acetaminophen, ondansetron **OR** ondansetron (ZOFRAN) IV  ? ?Vital Signs  ?  ?Vitals:  ? 08/12/21 0616 08/12/21 1408 08/12/21 2138 08/13/21 0500  ?BP:  122/65 136/69 132/72  ?Pulse:  (!) 55 64 65  ?Resp:   20 18  ?Temp:  97.9 ?F (36.6 ?C) 97.9 ?F (36.6 ?C) 98 ?F (36.7 ?C)  ?TempSrc:  Oral Oral Oral  ?SpO2:  100% 98% 98%  ?Weight: (!) 170.3 kg   (!) 169.1 kg  ?Height:      ? ? ?Intake/Output Summary (Last 24 hours) at 08/13/2021 0843 ?Last data filed at 08/13/2021 0546 ?Gross per 24 hour  ?Intake 700.09 ml  ?Output 3675 ml  ?Net -2974.91 ml  ? ?Last 3 Weights 08/13/2021 08/12/2021 08/11/2021  ?Weight (lbs) 372 lb 11.2 oz 375 lb 7.1 oz 382 lb 8 oz  ?Weight (kg) 169.056 kg 170.3 kg 173.5 kg  ?   ? ?Telemetry  ?  ?SB, HR 50s, PVCs - Personally Reviewed ? ?ECG  ?  ?No new - Personally Reviewed ? ?Physical Exam  ? ?GEN: No acute distress.   ?Neck: No JVD ?Cardiac: RRR, no murmurs, rubs, or gallops.  ?Respiratory: diminished sounds ?GI: Soft, nontender, non-distended  ?MS: trace edema; No deformity. ?Neuro:  Nonfocal  ?Psych: Normal affect  ? ?Labs  ?  ?High Sensitivity Troponin:   ?Recent Labs  ?Lab 08/10/21 ?1650 08/10/21 ?1838  ?TROPONINIHS  34* 34*  ?   ?Chemistry ?Recent Labs  ?Lab 08/11/21 ?1275 08/12/21 ?0507 08/13/21 ?1700  ?NA 140 138 142  ?K 4.6 4.3 4.3  ?CL 108 106 108  ?CO2 '25 25 24  '$ ?GLUCOSE 147* 141* 142*  ?BUN 87* 88* 88*  ?CREATININE 3.57* 3.61* 3.75*  ?CALCIUM 8.6* 8.4* 8.3*  ?ALBUMIN  --  2.7* 2.8*  ?GFRNONAA 18* 18* 17*  ?ANIONGAP '7 7 10  '$ ?  ?Lipids No results for input(s): CHOL, TRIG, HDL, LABVLDL, LDLCALC, CHOLHDL in the last 168 hours.  ?Hematology ?Recent Labs  ?Lab 08/10/21 ?1650 08/12/21 ?0507 08/13/21 ?1749  ?WBC 8.4 6.8 6.2  ?RBC 3.49* 2.88* 2.85*  ?HGB 11.4* 8.9* 9.0*  ?HCT 35.3* 29.2* 29.2*  ?MCV 101.1* 101.4* 102.5*  ?MCH 32.7 30.9 31.6  ?MCHC 32.3 30.5 30.8  ?RDW 15.6* 15.5 15.2  ?PLT 137* 120* 137*  ? ?Thyroid No results for input(s): TSH, FREET4 in the last 168 hours.  ?BNP ?Recent Labs  ?Lab 08/10/21 ?4496  ?BNP 475.0*  ?  ?DDimer No results for input(s): DDIMER in the last 168 hours.  ? ?Radiology  ?  ?DG Chest 2 View ? ?Result Date: 08/12/2021 ?CLINICAL DATA:  Chest  pain.  Shortness of breath. EXAM: CHEST - 2 VIEW COMPARISON:  Chest XR, 08/11/2018.  CT chest, 07/30/2017. FINDINGS: Cardiomediastinal silhouette is enlarged, unchanged. Hypoinflation. Mild, streaky RIGHT basilar opacities and persistent consolidation of the LEFT lower lobe. Trace layering fluid along the minor fissure. No large pleural effusion or pneumothorax. No acute osseous abnormality. IMPRESSION: 1. Hypoinflation with persistent LEFT lower lobe consolidation, favor atelectasis though superimposed pneumonia could appear similar. 2. Mild interstitial pulmonary edema. Electronically Signed   By: Michaelle Birks M.D.   On: 08/12/2021 08:03  ? ?ECHOCARDIOGRAM COMPLETE ? ?Result Date: 08/11/2021 ?   ECHOCARDIOGRAM REPORT   Patient Name:   [name] Date of Exam: 08/11/2021 Medical Rec #:  [id number]         Height:       74.0 in Accession #:    [id number]        Weight:       379.8 lb Date of Birth:  02/04/1961         BSA:          2.854 m? Patient Age:     61 years          BP:           134/68 mmHg Patient Gender: M                 HR:           58 bpm. Exam Location:  [name] Procedure: 2D Echo, Cardiac Doppler, Color Doppler and Intracardiac            Opacification Agent Indications:    I50.40* Unspecified combined systolic (congestive) and diastolic                 (congestive) heart failure  History:        Patient has prior history of Echocardiogram examinations, most                 recent 11/28/2020. CHF, Abnormal ECG, Arrythmias:Atrial Flutter,                 Signs/Symptoms:Bacteremia; Risk Factors:Diabetes, Sleep Apnea,                 Hypertension, Dyslipidemia and Former Smoker.  Sonographer:    Roseanna Rainbow RDCS Referring Phys: [phone] JACOB J STINSON  Sonographer Comments: Technically difficult study due to poor echo windows and patient is morbidly obese. Image acquisition challenging due to patient body habitus. IMPRESSIONS  1. Poor acoustic windows APical window is somewhat foreshortened. Severe distal inferior , inferoseptal hypokinesis; apoap. Left ventricular ejection fraction, by estimation, is 35 to 40%. The left ventricle has moderately decreased function. The left ventricular internal cavity size was severely dilated.  2. Right ventricular systolic function is normal. The right ventricular size is normal. There is normal pulmonary artery systolic pressure.  3. The mitral valve is normal in structure. Mild mitral valve regurgitation.  4. Echobright density along closeure line of Left coronary cusp 10 x 6 mm. . The aortic valve is tricuspid. Aortic valve regurgitation is not visualized.  5. Aortic dilatation noted. There is mild dilatation of the ascending aorta, measuring 41 mm.  6. The inferior vena cava is dilated in size with <50% respiratory variability, suggesting right atrial pressure of 15 mmHg. Comparison(s): The left ventricular function is unchanged. FINDINGS  Left Ventricle: Poor acoustic windows APical window is somewhat foreshortened.  Severe distal inferior , inferoseptal hypokinesis; apoap. Left ventricular ejection fraction, by  estimation, is 35 to 40%. The left ventricle has moderately decreased function. Definity contrast agent was given IV to delineate the left ventricular endocardial borders. The left ventricular internal cavity size was severely dilated. There is no left ventricular hypertrophy. Right Ventricle: The right ventricular size is normal. Right vetricular wall thickness was not assessed. Right ventricular systolic function is normal. There is normal pulmonary artery systolic pressure. The tricuspid regurgitant velocity is 2.56 m/s, and with an assumed right atrial pressure of 8 mmHg, the estimated right ventricular systolic pressure is 57.2 mmHg. Left Atrium: Left atrial size was normal in size. Right Atrium: Right atrial size was normal in size. Pericardium: There is no evidence of pericardial effusion. Mitral Valve: The mitral valve is normal in structure. Mild mitral valve regurgitation. Tricuspid Valve: The tricuspid valve is normal in structure. Tricuspid valve regurgitation is trivial. Aortic Valve: Echobright density along closeure line of Left coronary cusp 10 x 6 mm. The aortic valve is tricuspid. Aortic valve regurgitation is not visualized. Pulmonic Valve: The pulmonic valve was grossly normal. Pulmonic valve regurgitation is trivial. Aorta: The aortic root is normal in size and structure and aortic dilatation noted. There is mild dilatation of the ascending aorta, measuring 41 mm. Venous: The inferior vena cava is dilated in size with less than 50% respiratory variability, suggesting right atrial pressure of 15 mmHg. IAS/Shunts: No atrial level shunt detected by color flow Doppler.  LEFT VENTRICLE PLAX 2D LVIDd:         6.80 cm      Diastology LVIDs:         6.00 cm      LV e' medial:    3.65 cm/s LV PW:         1.00 cm      LV E/e' medial:  29.6 LV IVS:        0.80 cm      LV e' lateral:   7.22 cm/s LVOT diam:      2.70 cm      LV E/e' lateral: 15.0 LV SV:         125 LV SV Index:   44 LVOT Area:     5.73 cm?  LV Volumes (MOD) LV vol d, MOD A2C: 364.0 ml LV vol d, MOD A4C: 408.0 ml LV vol s, MOD A2C: 192.0 ml LV v

## 2021-08-14 ENCOUNTER — Ambulatory Visit (HOSPITAL_COMMUNITY): Payer: BC Managed Care – PPO

## 2021-08-14 ENCOUNTER — Inpatient Hospital Stay (HOSPITAL_COMMUNITY): Payer: BC Managed Care – PPO

## 2021-08-14 DIAGNOSIS — R0600 Dyspnea, unspecified: Secondary | ICD-10-CM

## 2021-08-14 DIAGNOSIS — I5023 Acute on chronic systolic (congestive) heart failure: Principal | ICD-10-CM

## 2021-08-14 DIAGNOSIS — I5041 Acute combined systolic (congestive) and diastolic (congestive) heart failure: Secondary | ICD-10-CM | POA: Diagnosis not present

## 2021-08-14 DIAGNOSIS — N1832 Chronic kidney disease, stage 3b: Secondary | ICD-10-CM | POA: Diagnosis not present

## 2021-08-14 DIAGNOSIS — I5022 Chronic systolic (congestive) heart failure: Secondary | ICD-10-CM | POA: Diagnosis not present

## 2021-08-14 DIAGNOSIS — D509 Iron deficiency anemia, unspecified: Secondary | ICD-10-CM

## 2021-08-14 DIAGNOSIS — I4892 Unspecified atrial flutter: Secondary | ICD-10-CM | POA: Diagnosis not present

## 2021-08-14 LAB — GLUCOSE, CAPILLARY
Glucose-Capillary: 130 mg/dL — ABNORMAL HIGH (ref 70–99)
Glucose-Capillary: 144 mg/dL — ABNORMAL HIGH (ref 70–99)
Glucose-Capillary: 146 mg/dL — ABNORMAL HIGH (ref 70–99)
Glucose-Capillary: 134 mg/dL — ABNORMAL HIGH (ref 70–99)

## 2021-08-14 LAB — CBC
HCT: 30.3 % — ABNORMAL LOW (ref 39.0–52.0)
Hemoglobin: 9.1 g/dL — ABNORMAL LOW (ref 13.0–17.0)
MCH: 30.5 pg (ref 26.0–34.0)
MCHC: 30 g/dL (ref 30.0–36.0)
MCV: 101.7 fL — ABNORMAL HIGH (ref 80.0–100.0)
Platelets: 147 10*3/uL — ABNORMAL LOW (ref 150–400)
RBC: 2.98 MIL/uL — ABNORMAL LOW (ref 4.22–5.81)
RDW: 15.1 % (ref 11.5–15.5)
WBC: 5.5 10*3/uL (ref 4.0–10.5)
nRBC: 0 % (ref 0.0–0.2)

## 2021-08-14 LAB — BASIC METABOLIC PANEL
Anion gap: 9 (ref 5–15)
BUN: 84 mg/dL — ABNORMAL HIGH (ref 8–23)
CO2: 26 mmol/L (ref 22–32)
Calcium: 8.3 mg/dL — ABNORMAL LOW (ref 8.9–10.3)
Chloride: 108 mmol/L (ref 98–111)
Creatinine, Ser: 3.59 mg/dL — ABNORMAL HIGH (ref 0.61–1.24)
GFR, Estimated: 18 mL/min — ABNORMAL LOW (ref >60)
Glucose, Bld: 123 mg/dL — ABNORMAL HIGH (ref 70–99)
Potassium: 4.1 mmol/L (ref 3.5–5.1)
Sodium: 143 mmol/L (ref 135–145)

## 2021-08-14 LAB — BRAIN NATRIURETIC PEPTIDE: B Natriuretic Peptide: 330 pg/mL — ABNORMAL HIGH (ref 0.0–100.0)

## 2021-08-14 NOTE — Progress Notes (Signed)
? ?Progress Note ? ?Patient Name: [name] ?Date of Encounter: 08/14/2021 ? ?[facility] Cardiologist: Kate Sable, MD (Inactive) [place] Byers ? ?Subjective  ? ?SOB much improved ? ?Inpatient Medications  ?  ?Scheduled Meds: ? atorvastatin  80 mg Oral QHS  ? doxycycline  100 mg Oral Q12H  ? furosemide  80 mg Intravenous BID  ? hydrALAZINE  100 mg Oral TID  ? insulin aspart  0-15 Units Subcutaneous TID WC  ? insulin aspart  0-5 Units Subcutaneous QHS  ? isosorbide mononitrate  30 mg Oral Daily  ? lipase/protease/amylase  36,000 Units Oral TID AC  ? potassium chloride  40 mEq Oral BID  ? rivaroxaban  15 mg Oral Q supper  ? ?Continuous Infusions: ? cefTRIAXone (ROCEPHIN)  IV 2 g (08/13/21 2110)  ? ferric gluconate (FERRLECIT) IVPB 250 mg (08/14/21 0834)  ? ?PRN Meds: ?acetaminophen, ondansetron **OR** ondansetron (ZOFRAN) IV  ? ?Vital Signs  ?  ?Vitals:  ? 08/13/21 1414 08/13/21 2056 08/14/21 0500 08/14/21 0619  ?BP: 131/71 122/74  124/74  ?Pulse: (!) 58 (!) 58  (!) 56  ?Resp: _0 ?Temp:  98.4 ?F (36.9 ?C)  98.1 ?F (36.7 ?C)  ?TempSrc:  Oral  Oral  ?SpO2: 99% 99%  98%  ?Weight:  (!) 164.2 kg (!) 164.2 kg   ?Height:      ? ? ?Intake/Output Summary (Last 24 hours) at 08/14/2021 0956 ?Last data filed at 08/14/2021 0900 ?Gross per 24 hour  ?Intake 776.64 ml  ?Output 4025 ml  ?Net -3248.36 ml  ? ?Last 3 Weights 08/14/2021 08/13/2021 08/13/2021  ?Weight (lbs) 361 lb 14.4 oz 361 lb 14.4 oz 372 lb 11.2 oz  ?Weight (kg) 164.157 kg 164.157 kg 169.056 kg  ?   ? ?Telemetry  ?  ?Afib rate controlled - Personally Reviewed ? ?ECG  ?  ?N/a - Personally Reviewed ? ?Physical Exam  ? ?GEN: No acute distress.   ?Neck: No JVD ?Cardiac: irreg ?Respiratory: Clear to auscultation bilaterally. ?GI: Soft, nontender, non-distended  ?MS: No edema; No deformity. ?Neuro:  Nonfocal  ?Psych: Normal affect  ? ?Labs  ?  ?High Sensitivity Troponin:   ?Recent Labs  ?Lab 08/10/21 ?1650 08/10/21 ?1838  ?TROPONINIHS 34* 34*  ?    ?Chemistry ?Recent Labs  ?Lab 08/12/21 ?0507 08/13/21 ?9539 08/14/21 ?6728  ?NA 138 142 143  ?K 4.3 4.3 4.1  ?CL 106 108 108  ?CO2 _1 ?GLUCOSE 141* 142* 123*  ?BUN 88* 88* 84*  ?CREATININE 3.61* 3.75* 3.59*  ?CALCIUM 8.4* 8.3* 8.3*  ?ALBUMIN 2.7* 2.8*  --   ?GFRNONAA 18* 17* 18*  ?ANIONGAP _2 ?  ?Lipids No results for input(s): CHOL, TRIG, HDL, LABVLDL, LDLCALC, CHOLHDL in the last 168 hours.  ?Hematology ?Recent Labs  ?Lab 08/12/21 ?0507 08/13/21 ?9791 08/14/21 ?5041  ?WBC 6.8 6.2 5.5  ?RBC 2.88* 2.85* 2.98*  ?HGB 8.9* 9.0* 9.1*  ?HCT 29.2* 29.2* 30.3*  ?MCV 101.4* 102.5* 101.7*  ?MCH 30.9 31.6 30.5  ?MCHC 30.5 30.8 30.0  ?RDW 15.5 15.2 15.1  ?PLT 120* 137* 147*  ? ?Thyroid No results for input(s): TSH, FREET4 in the last 168 hours.  ?BNP ?Recent Labs  ?Lab 08/10/21 ?1652 08/14/21 ?3643  ?BNP 475.0* 330.0*  ?  ?DDimer No results for input(s): DDIMER in the last 168 hours.  ? ?Radiology  ?  ?DG Chest 2 View ? ?Result Date: 08/14/2021 ?CLINICAL DATA:  Shortness of breath and cough EXAM: CHEST -  2 VIEW COMPARISON:  Radiograph 08/12/2021 FINDINGS: Unchanged cardiomediastinal silhouette. Unchanged bowel containing left-sided diaphragmatic hernia with adjacent left basilar atelectasis. There are streaky opacities in the right mid to lower lung and mild pulmonary vascular congestion. No new airspace disease. No acute osseous abnormality. Unchanged probable calcified joint body along the right glenohumeral joint. IMPRESSION: Stable pulmonary vascular congestion with streaky opacities in the right mid to lower lung, likely interstitial edema. Unchanged bowel containing left-sided diaphragmatic hernia with adjacent left basilar atelectasis. Electronically Signed   By: Maurine Simmering M.D.   On: 08/14/2021 08:29   ? ?Cardiac Studies  ? ? ? ?Patient Profile  ?   ?61 y.o. male with a hx of CAD with remote stenting, chronic systolic and diastolic heart failure, ?ICM (EF 30-35%), CKD stage 4, multiple myeloma, bradycardia  on BB, chronic anemia, paroxysmal Aflutter on a/c, OSA on CPAP, obesity, HTN, HLD who is being seen 08/12/2021 for the evaluation of acute heart failure  ? ?Assessment & Plan  ?  ?1.Acute on chronic systolic HF ?- - several year history of chronic systolic HF, ICM ?- medical therapy limited. Bradycardia on beta blockers now off. Significant renal dysfunction, on just low dose ACEI per renal.  ? ?- 07/2021 echo: LVE 35-40%, normal RV ?- negaive 2.5 L yesterday, neg 7.6 L since admission. He is in IV lasix 32m bid, Cr is downtrending with diuresis consistent with venous congestion and HF. Continue IV diuresis today, symptoms significnatly improving.  ?- medical therapy limited by bradycardia, renal dysfunction. Continhe hydral 107mtid, imdur 30 ? ?2.Aflutter ?- self rate controlled, previous bradycardia on beta blockers ?- continue xarleto ? ?3. CKD IV ?- per renal ?- Cr trending down some with diuresis ? ?4. History of CAD prior interventions ?- mild flat trop in setting of HF and advanced CKD ?- continue medical therapy ? ?For questions or updates, please contact [email or web address] consult [email or web address] for contact info under  ? ?  ?   ?Signed, ?BrCarlyle DollyMD  ?08/14/2021, 9:56 AM   ? ?

## 2021-08-14 NOTE — Progress Notes (Signed)
?[facility] ?Progress Note  ? ? ?Assessment/ Plan:   ?1. Acute kidney Injury on advanced CKD ?-likely related to diuretics vs progressive CKD. CKD secondary to obesity/HTN/DM/MM/history of repeated AKIs. Baseline Cr seems to range around 3. Follows with [name] ([place]). Fortunately Cr is stable/slightly better at this time ?-no indication for renal replacement therapy at this time especially since it seems that his volume status is improving. Not exhibiting any uremic symptoms at this time ?2. HFrEF, acute on chronic systolic CHF exacerbation ?-on lasix '80mg'$  IV BID. BNP improving. Can likely transition back to home torsemide dose when ready ?3. Anemia of chronic disease ?-transfuse for hgb <7. S/p Aranesp 160mg 3/14. Receiving Fe IV ?4. HTN/volume ?-responding well to diuretics ?5. Secondary hyperparathyroidism ?-phos at goal 3/14 ?6. DM2 ?-per primary service ? ?Subjective:   ?No acute events. Patient reports that he is feeling much better in regards to his breathing. He reports it as 'back to normal.'   ? ?Objective:   ?BP 124/74 (BP Location: Right Arm)   Pulse (!) 56   Temp 98.1 ?F (36.7 ?C) (Oral)   Resp 18   Ht '6\' 2"'$  (1.88 m)   Wt (!) 164.2 kg   SpO2 98%   BMI 46.47 kg/m?  ? ?Intake/Output Summary (Last 24 hours) at 08/14/2021 0839 ?Last data filed at 08/14/2021 0543 ?Gross per 24 hour  ?Intake 1016.64 ml  ?Output 3575 ml  ?Net -2558.36 ml  ? ?Weight change: -4.899 kg ? ?Physical Exam: ?Gen:nad ?CVS:s1s2, rrr ?Resp:diminished air entry bibasilar (body habitus) otherwise no overt w/r/r/c ?AEUM:PNTIR soft ?Ext:1+ pitting edema b/l Les ?Neuro: awake alert ? ?Imaging: ?DG Chest 2 View ? ?Result Date: 08/14/2021 ?CLINICAL DATA:  Shortness of breath and cough EXAM: CHEST - 2 VIEW COMPARISON:  Radiograph 08/12/2021 FINDINGS: Unchanged cardiomediastinal silhouette. Unchanged bowel containing left-sided diaphragmatic hernia with adjacent left basilar atelectasis. There are streaky opacities in  the right mid to lower lung and mild pulmonary vascular congestion. No new airspace disease. No acute osseous abnormality. Unchanged probable calcified joint body along the right glenohumeral joint. IMPRESSION: Stable pulmonary vascular congestion with streaky opacities in the right mid to lower lung, likely interstitial edema. Unchanged bowel containing left-sided diaphragmatic hernia with adjacent left basilar atelectasis. Electronically Signed   By: JMaurine SimmeringM.D.   On: 08/14/2021 08:29   ? ?Labs: ?BMET ?Recent Labs  ?Lab 08/10/21 ?1650 08/11/21 ?0443103/13/23 ?0507 08/13/21 ?0540003/15/23 ?08676 ?NA 139 140 138 142 143  ?K 4.3 4.6 4.3 4.3 4.1  ?CL 105 108 106 108 108  ?CO2 '23 25 25 24 26  '$ ?GLUCOSE 171* 147* 141* 142* 123*  ?BUN 85* 87* 88* 88* 84*  ?CREATININE 3.50* 3.57* 3.61* 3.75* 3.59*  ?CALCIUM 8.8* 8.6* 8.4* 8.3* 8.3*  ?PHOS  --   --  3.6 4.0  --   ? ?CBC ?Recent Labs  ?Lab 08/10/21 ?1650 08/12/21 ?0507 08/13/21 ?0195003/15/23 ?09326 ?WBC 8.4 6.8 6.2 5.5  ?HGB 11.4* 8.9* 9.0* 9.1*  ?HCT 35.3* 29.2* 29.2* 30.3*  ?MCV 101.1* 101.4* 102.5* 101.7*  ?PLT 137* 120* 137* 147*  ? ? ?Medications:   ? ? atorvastatin  80 mg Oral QHS  ? doxycycline  100 mg Oral Q12H  ? furosemide  80 mg Intravenous BID  ? hydrALAZINE  100 mg Oral TID  ? insulin aspart  0-15 Units Subcutaneous TID WC  ? insulin aspart  0-5 Units Subcutaneous QHS  ? isosorbide mononitrate  30 mg Oral Daily  ?  lipase/protease/amylase  36,000 Units Oral TID AC  ? potassium chloride  40 mEq Oral BID  ? rivaroxaban  15 mg Oral Q supper  ? ? ? ? ?Gean Quint, MD ?[facility] ?08/14/2021, 8:39 AM  ? ?

## 2021-08-14 NOTE — Progress Notes (Signed)
?PROGRESS NOTE ? ? ? ? ?[name], is a 61 y.o. male, DOB - 01/21/1961, VOZ:[id number] ? ?Admit date - 08/10/2021   Admitting Physician No admitting provider for patient encounter. ? ?Outpatient Primary MD for the patient is [name], MD ? ?LOS - 4 ? ?Chief Complaint  ?Patient presents with  ? Chest Pain  ?    ? ? ?Brief Narrative:  ?61 year old with past medical history relevant for CAD with prior MI, multiple myeloma, systolic dysfunction CHF, morbid obesity, CKD 4, HTN, atrial flutter on chronic anticoagulation, DM2, OSA on CPAP admitted on 08/10/2021 with acute on chronic CHF exacerbation with new onset hypoxia ?  ?-Assessment and Plan: ? ? ?1)HFrEF--acute on chronic systolic dysfunction CHF exacerbation with significant volume overload in the setting of worsening kidney function and increased fluid intake PTA ?- EF is 35 to 40%. -Increased IV Lasix to 80 mg twice daily ?-Daily weights fluid input and output monitor ?-Continue hydralazine/isosorbide combo in lieu of ACE/ARB/ARNI due to renal concerns ?- diuresing well, weight trending down (Wt is down toi 372 Lb from 382 pounds on 08/11/21) ?-Repeat BNP and chest x-ray in a.m. ?Pt uses Lymphedema pumps at home--- ?Filed Weights  ? 08/13/21 0500 08/13/21 2056 08/14/21 0500  ?Weight: (!) 169.1 kg (!) 164.2 kg (!) 164.2 kg  ? ?2) chronic normocytic anemia--in the setting of CKD 4 and underlying multiple myeloma ?-Patient follows with [name] for Retacrit and Venofer infusion ?-Nephrology input for ESA and iron infusion appreciated ?-Repeat CBC in a.m. ? ?3) AKI on CKD stage -IV   ?-Progressive CKD creatinine currently around 3.5, creatinine is trending up very slightly ?-Patient sees [name] ?-Attempts to aggressively diurese him is resulted in AKI on CKD previously ?-Nephrology consult from Dr. Beverely Pace appreciated ?-renally adjust medications, avoid nephrotoxic agents / dehydration  / hypotension ?Lab Results  ?Component Value Date  ?  CREATININE 3.59 (H) 08/14/2021  ? CREATININE 3.75 (H) 08/13/2021  ? CREATININE 3.61 (H) 08/12/2021  ? ?4) acute hypoxic respiratory failure--- secondary to 1 above ?-Anticipate improvement with diuresis ?-Repeat chest x-ray on 08/12/2021 after diuresis suggest atelectasis -able to wean patient off oxygen at this time ?-Hypoxia has resolved ?-Empiric treatment with IV Rocephin and doxycycline for possible pneumonia ?-Repeat chest x-ray on 08/14/2021 showing improvement in edema  ? ?5)Morbid Obesity/OSA ?-Low calorie diet, portion control and increase physical activity discussed with patient ?-Body mass index is 46.47 kg/m?. ?-Continue CPAP nightly ? ?6) DM2--.A1C 6.4 reflecting excellent diabetic control PTA ?Use Novolog/Humalog Sliding scale insulin with Accu-Cheks/Fingersticks as ordered  ? ?7) chronic atrial flutter/fibrillation-rate appears controlled, continue Xarelto for stroke prophylaxis ?-Patient has baseline bradycardia so no beta-blocker ? ?8)HTN-discontinue amlodipine in setting of low EF  ?-continue with hydralazine and isosorbide ?avoid ACEI/ARB/ARNI due to renal concerns ? ?Disposition/Need for in-Hospital Stay- patient unable to be discharged at this time due to -acute systolic dysfunction CHF ?Patient requiring IV diuresis and supplemental oxygen and IV antibiotics for presumed pneumonia ?-anticipate DC home 3/16 pending consultant sign off (cardiology) ? ?Status is: Inpatient  ? ?Disposition: The patient is from: Home ?             Anticipated d/c is to: Home ?             Anticipated d/c date is: 1 day  tomorrow ? ?             Patient currently is not medically stable to d/c. ?Barriers: Not Clinically Stable-  ? ?Code Status :  -  Code Status: Full Code  ? ?Family Communication:    (patient is alert, awake and coherent)  ?-Discussed with wife at bedside ? ?DVT Prophylaxis  :   - SCDs    ?Rivaroxaban (XARELTO) tablet 15 mg  ? ?Lab Results  ?Component Value Date  ? PLT 147 (L) 08/14/2021   ? ? ?Inpatient Medications ? ?Scheduled Meds: ? atorvastatin  80 mg Oral QHS  ? doxycycline  100 mg Oral Q12H  ? furosemide  80 mg Intravenous BID  ? hydrALAZINE  100 mg Oral TID  ? insulin aspart  0-15 Units Subcutaneous TID WC  ? insulin aspart  0-5 Units Subcutaneous QHS  ? isosorbide mononitrate  30 mg Oral Daily  ? lipase/protease/amylase  36,000 Units Oral TID AC  ? potassium chloride  40 mEq Oral BID  ? rivaroxaban  15 mg Oral Q supper  ? ?Continuous Infusions: ? cefTRIAXone (ROCEPHIN)  IV 2 g (08/13/21 2110)  ? ferric gluconate (FERRLECIT) IVPB 250 mg (08/14/21 0834)  ? ?PRN Meds:.acetaminophen, ondansetron **OR** ondansetron (ZOFRAN) IV ? ? ?Anti-infectives (From admission, onward)  ? ? Start     Dose/Rate Route Frequency Ordered Stop  ? 08/12/21 2200  doxycycline (VIBRA-TABS) tablet 100 mg       ? 100 mg Oral Every 12 hours 08/12/21 1954    ? 08/12/21 2100  cefTRIAXone (ROCEPHIN) 2 g in sodium chloride 0.9 % 100 mL IVPB       ? 2 g ?200 mL/hr over 30 Minutes Intravenous Every 24 hours 08/12/21 1954    ? ?  ? ?  ? ?Subjective: ?[name]  overall feeling much better and continuing to urinate frequently ?Objective: ?Vitals:  ? 08/14/21 0500 08/14/21 0619 08/14/21 1200 08/14/21 1403  ?BP:  124/74 138/72 128/64  ?Pulse:  (!) 56 61 (!) 58  ?Resp:  _0 ?Temp:  98.1 ?F (36.7 ?C) 98 ?F (36.7 ?C) 97.9 ?F (36.6 ?C)  ?TempSrc:  Oral Oral   ?SpO2:  98% 98% 98%  ?Weight: (!) 164.2 kg     ?Height:      ? ? ?Intake/Output Summary (Last 24 hours) at 08/14/2021 1756 ?Last data filed at 08/14/2021 1100 ?Gross per 24 hour  ?Intake 480 ml  ?Output 3275 ml  ?Net -2795 ml  ? ?Filed Weights  ? 08/13/21 0500 08/13/21 2056 08/14/21 0500  ?Weight: (!) 169.1 kg (!) 164.2 kg (!) 164.2 kg  ? ?Physical Exam ? ?Gen:- Awake Alert, morbidly obese, significant dyspnea on exertion ?HEENT:- [place].AT, No sclera icterus ?Lungs--improving air movement, no wheeze ?CV- S1, S2 normal, regular  ?Abd-  +ve B.Sounds, Abd Soft, No tenderness,  increased truncal adiposity ?Extremity/Skin:-1+ pitting edema, pedal pulses present  ?Psych-affect is appropriate, oriented x3 ?Neuro-generalized weakness, no new focal deficits, no tremors ? ?Data Reviewed: I have personally reviewed following labs and imaging studies ? ?CBC: ?Recent Labs  ?Lab 08/08/21 ?1655 08/10/21 ?1650 08/12/21 ?0507 08/13/21 ?3748 08/14/21 ?2707  ?WBC  --  8.4 6.8 6.2 5.5  ?HGB 10.6* 11.4* 8.9* 9.0* 9.1*  ?HCT  --  35.3* 29.2* 29.2* 30.3*  ?MCV  --  101.1* 101.4* 102.5* 101.7*  ?PLT  --  137* 120* 137* 147*  ? ?Basic Metabolic Panel: ?Recent Labs  ?Lab 08/10/21 ?1650 08/11/21 ?8675 08/12/21 ?0507 08/13/21 ?4492 08/14/21 ?0100  ?NA 139 140 138 142 143  ?K 4.3 4.6 4.3 4.3 4.1  ?CL 105 108 106 108 108  ?CO2 _1 ?GLUCOSE 171* 147*  141* 142* 123*  ?BUN 85* 87* 88* 88* 84*  ?CREATININE 3.50* 3.57* 3.61* 3.75* 3.59*  ?CALCIUM 8.8* 8.6* 8.4* 8.3* 8.3*  ?PHOS  --   --  3.6 4.0  --   ? ?GFR: ?Estimated Creatinine Clearance: 34.7 mL/min (A) (by C-G formula based on SCr of 3.59 mg/dL (H)). ?Liver Function Tests: ?Recent Labs  ?Lab 08/12/21 ?0507 08/13/21 ?9290  ?ALBUMIN 2.7* 2.8*  ? ?Cardiac Enzymes: ?No results for input(s): CKTOTAL, CKMB, CKMBINDEX, TROPONINI in the last 168 hours. ?BNP (last 3 results) ?No results for input(s): PROBNP in the last 8760 hours. ?HbA1C: ?No results for input(s): HGBA1C in the last 72 hours. ? ?Sepsis Labs: ?_0 (procalcitonin:4,lacticidven:4) ?) ?Recent Results (from the past 240 hour(s))  ?Resp Panel by RT-PCR (Flu A&B, Covid) Nasopharyngeal Swab     Status: None  ? Collection Time: 08/10/21  5:38 PM  ? Specimen: Nasopharyngeal Swab; Nasopharyngeal(NP) swabs in vial transport medium  ?Result Value Ref Range Status  ? SARS Coronavirus 2 by RT PCR NEGATIVE NEGATIVE Final  ?  Comment: (NOTE) ?SARS-CoV-2 target nucleic acids are NOT DETECTED. ? ?The SARS-CoV-2 RNA is generally detectable in upper respiratory ?specimens during the acute phase of infection.  The lowest ?concentration of SARS-CoV-2 viral copies this assay can detect is ?138 copies/mL. A negative result does not preclude SARS-Cov-2 ?infection and should not be used as the sole basis for treatment or ?other patien

## 2021-08-15 DIAGNOSIS — I5022 Chronic systolic (congestive) heart failure: Secondary | ICD-10-CM | POA: Diagnosis not present

## 2021-08-15 DIAGNOSIS — N1832 Chronic kidney disease, stage 3b: Secondary | ICD-10-CM | POA: Diagnosis not present

## 2021-08-15 DIAGNOSIS — I5041 Acute combined systolic (congestive) and diastolic (congestive) heart failure: Secondary | ICD-10-CM | POA: Diagnosis not present

## 2021-08-15 DIAGNOSIS — I5023 Acute on chronic systolic (congestive) heart failure: Secondary | ICD-10-CM | POA: Diagnosis not present

## 2021-08-15 DIAGNOSIS — I4892 Unspecified atrial flutter: Secondary | ICD-10-CM | POA: Diagnosis not present

## 2021-08-15 LAB — BASIC METABOLIC PANEL
Anion gap: 12 (ref 5–15)
BUN: 81 mg/dL — ABNORMAL HIGH (ref 8–23)
CO2: 24 mmol/L (ref 22–32)
Calcium: 8.7 mg/dL — ABNORMAL LOW (ref 8.9–10.3)
Chloride: 109 mmol/L (ref 98–111)
Creatinine, Ser: 3.53 mg/dL — ABNORMAL HIGH (ref 0.61–1.24)
GFR, Estimated: 19 mL/min — ABNORMAL LOW (ref >60)
Glucose, Bld: 120 mg/dL — ABNORMAL HIGH (ref 70–99)
Potassium: 4.1 mmol/L (ref 3.5–5.1)
Sodium: 145 mmol/L (ref 135–145)

## 2021-08-15 LAB — GLUCOSE, CAPILLARY
Glucose-Capillary: 126 mg/dL — ABNORMAL HIGH (ref 70–99)
Glucose-Capillary: 127 mg/dL — ABNORMAL HIGH (ref 70–99)

## 2021-08-15 LAB — MAGNESIUM: Magnesium: 2.3 mg/dL (ref 1.7–2.4)

## 2021-08-15 MED ORDER — TORSEMIDE 20 MG PO TABS: 40.0000 mg | ORAL_TABLET | Freq: Every day | ORAL | Status: DC

## 2021-08-15 MED ORDER — TORSEMIDE 20 MG PO TABS: ORAL_TABLET | ORAL | Status: DC

## 2021-08-15 MED ORDER — TORSEMIDE 20 MG PO TABS: 20.0000 mg | ORAL_TABLET | Freq: Every evening | ORAL | Status: DC

## 2021-08-15 MED ORDER — DOXYCYCLINE HYCLATE 100 MG PO TABS
100.0000 mg | ORAL_TABLET | Freq: Two times a day (BID) | ORAL | 0 refills | Status: AC
Start: 2021-08-15 — End: 2021-08-18

## 2021-08-15 MED ORDER — RIVAROXABAN 15 MG PO TABS: 15.0000 mg | ORAL_TABLET | Freq: Every day | ORAL | 1 refills | Status: DC

## 2021-08-15 NOTE — Plan of Care (Signed)
?  Problem: Education: ?Goal: Ability to demonstrate management of disease process will improve ?08/15/2021 1210 by Santa Lighter, RN ?Outcome: Adequate for Discharge ?08/15/2021 0931 by Santa Lighter, RN ?Outcome: Progressing ?Goal: Ability to verbalize understanding of medication therapies will improve ?Outcome: Adequate for Discharge ?Goal: Individualized Educational Video(s) ?Outcome: Adequate for Discharge ?  ?Problem: Activity: ?Goal: Capacity to carry out activities will improve ?08/15/2021 1210 by Santa Lighter, RN ?Outcome: Adequate for Discharge ?08/15/2021 0931 by Santa Lighter, RN ?Outcome: Progressing ?  ?Problem: Cardiac: ?Goal: Ability to achieve and maintain adequate cardiopulmonary perfusion will improve ?08/15/2021 1210 by Santa Lighter, RN ?Outcome: Adequate for Discharge ?08/15/2021 0931 by Santa Lighter, RN ?Outcome: Progressing ?  ?

## 2021-08-15 NOTE — Discharge Summary (Signed)
Physician Discharge Summary  ?[name] VEL:[id number] DOB: 1960/08/10 DOA: 08/10/2021 ? ?PCP: [name], MD ? ?Admit date: 08/10/2021 ?Discharge date: 08/15/2021 ? ?Admitted From:  Home  ?Disposition: Home  ? ?Recommendations for Outpatient Follow-up:  ?Follow up with PCP in 1 weeks ?Follow up with cardiology as scheduled ?Please obtain BMP in 1-2 weeks to follow renal function and electrolytes ?Follow up with nephrologist as scheduled  ? ?Discharge Condition: STABLE   ?CODE STATUS: FULL  ?DIET: renal   ? ?Brief Hospitalization Summary: ?Please see all hospital notes, images, labs for full details of the hospitalization. ?Brief Narrative:  ?61 year old with past medical history relevant for CAD with prior MI, multiple myeloma, systolic dysfunction CHF, morbid obesity, CKD 4, HTN, atrial flutter on chronic anticoagulation, DM2, OSA on CPAP admitted on 08/10/2021 with acute on chronic CHF exacerbation with new onset hypoxia ?  ?-Assessment and Plan: ?  ?  ?1)HFrEF--acute on chronic systolic dysfunction CHF exacerbation with significant volume overload in the setting of worsening kidney function and increased fluid intake PTA ?- EF is 35 to 40%. -Increased IV Lasix to 80 mg twice daily with good results. He has diuresed well and now has been resumed on home torsemide 40 mg every morning and 20 mg every evening.  ?-Daily weights fluid input and output monitor ?-Continue hydralazine/isosorbide combo in lieu of ACE/ARB/ARNI due to renal concerns ?- diuresing well, weight trending down (Wt is down toi 372 Lb from 382 pounds on 08/11/21) ?-Repeat BNP and chest x-ray in a.m. ?Pt uses Lymphedema pumps at home--- ?     ?Filed Weights  ?  08/13/21 0500 08/13/21 2056 08/14/21 0500  ?Weight: (!) 169.1 kg (!) 164.2 kg (!) 164.2 kg  ?  ?2) chronic normocytic anemia--in the setting of CKD 4 and underlying multiple myeloma ?-Patient follows with [name] for Retacrit and Venofer infusion ?-Nephrology input for ESA and iron  infusion appreciated ?-CBC stable.  ?  ?3) AKI on CKD stage -IV   ?-Progressive CKD creatinine currently around 3.5, creatinine is trending up very slightly ?-Patient sees [name] - pt advised to follow up as soon as possible.  ?-Attempts to aggressively diurese him is resulted in AKI on CKD previously ?-Nephrology consult appreciated ?-renally adjust medications, avoid nephrotoxic agents / dehydration  / hypotension ?Recent Labs  ?     ?Lab Results  ?Component Value Date  ?  CREATININE 3.59 (H) 08/14/2021  ?  CREATININE 3.75 (H) 08/13/2021  ?  CREATININE 3.61 (H) 08/12/2021  ?  ?  ?4) acute hypoxic respiratory failure--- secondary to 1 above ?-Resolved after diuresis.   ?-Repeat chest x-ray on 08/12/2021 after diuresis suggest atelectasis -able to wean patient off oxygen at this time ?-Hypoxia has resolved ?-Empiric treatment with IV Rocephin and doxycycline for possible pneumonia ?-Repeat chest x-ray on 08/14/2021 showing improvement in edema  ?  ?5)Morbid Obesity/OSA ?-Low calorie diet, portion control and increase physical activity discussed with patient ?-Body mass index is 46.47 kg/m?. ?-Continue CPAP nightly ?  ?6) DM2--.A1C 6.4 reflecting excellent diabetic control PTA ?Use Novolog/Humalog Sliding scale insulin with Accu-Cheks/Fingersticks as ordered  ?  ?7) chronic atrial flutter/fibrillation-rate appears controlled, continue Xarelto for stroke prophylaxis ?-Patient has baseline bradycardia so no beta-blocker ?  ?8)HTN-discontinue amlodipine in setting of low EF  ?-continue with hydralazine and isosorbide ?avoid ACEI/ARB/ARNI due to renal concerns ? ? ?Discharge Diagnoses:  ?Principal Problem: ?  Acute CHF (congestive heart failure) ([place]) ?Active Problems: ?  Type 2 diabetes mellitus with  complication, with long-term current use of insulin ([place]) ?  OSA on CPAP ?  Multiple myeloma ([place]) ?  Atrial flutter ([place]) ?  Chronic systolic heart failure ([place]) ?  CKD (chronic kidney disease) stage 3, GFR 30-59 ml/min  ([facility]) ?  Dyspnea ? ?Discharge Instructions: ? ?Allergies as of 08/15/2021   ? ?   Reactions  ? Beef-derived Products Diarrhea  ? Pork-derived Products Diarrhea  ? Other   ? Band aids  ? ?  ? ?  ?Medication List  ?  ? ?STOP taking these medications   ? ?amLODipine 5 MG tablet ?Commonly known as: NORVASC ?  ? ?  ? ?TAKE these medications   ? ?Accu-Chek Guide w/Device Kit ?USE TO CHECK BLOOD SUGARSITWICE DAILY. ?  ?acetaminophen 500 MG tablet ?Commonly known as: TYLENOL ?Take 1,000 mg by mouth every 6 (six) hours as needed. ?  ?acyclovir 400 MG tablet ?Commonly known as: ZOVIRAX ?Take 1 tablet (400 mg total) by mouth every morning. ?  ?allopurinol 300 MG tablet ?Commonly known as: ZYLOPRIM ?TAKE ONE TABLET BY MOUTH ONCE DAILY. ?  ?atorvastatin 80 MG tablet ?Commonly known as: LIPITOR ?TAKE ONE TABLET BY MOUTH AT BEDTIME. ?  ?calcitRIOL 0.25 MCG capsule ?Commonly known as: ROCALTROL ?Take 0.25 mcg by mouth 3 (three) times a week. Monday, Wednesday and Friday ?  ?Ciclopirox 0.77 % gel ?SMARTSIG:Sparingly Topical Twice Daily ?  ?Contour Next Test test strip ?Generic drug: glucose blood ?  ?Creon 36000 UNITS Cpep capsule ?Generic drug: lipase/protease/amylase ?Take 36,000 Units by mouth 3 (three) times daily as needed. ?  ?D2000 Ultra Strength 50 MCG (2000 UT) Caps ?Generic drug: Cholecalciferol ?Take by mouth. ?  ?dicyclomine 20 MG tablet ?Commonly known as: BENTYL ?TAKE 1 TABLET BY MOUTH BEFORE MEALS 3 TIMES DAILY. ?  ?doxycycline 100 MG tablet ?Commonly known as: VIBRA-TABS ?Take 1 tablet (100 mg total) by mouth every 12 (twelve) hours for 3 days. ?  ?Eluxadoline 100 MG Tabs ?Take 100 mg by mouth 2 (two) times daily with a meal. ?  ?ferrous sulfate 325 (65 FE) MG EC tablet ?Take 45 mg by mouth. Every other day ?  ?gabapentin 600 MG tablet ?Commonly known as: NEURONTIN ?TAKE 1 TABLET BY MOUTH ONCE A DAY. ?  ?hydrALAZINE 100 MG tablet ?Commonly known as: APRESOLINE ?Take 1 tablet (100 mg total) by mouth 3 (three) times  daily. ?  ?insulin aspart 100 UNIT/ML injection ?Commonly known as: novoLOG ?Inject 1-3 Units into the skin 3 (three) times daily with meals. Sliding scale over 150= 1 unit, 200= 2 units,250= 3 units ?  ?insulin detemir 100 UNIT/ML injection ?Commonly known as: LEVEMIR ?Inject 0.1 mLs (10 Units total) into the skin every morning. ?  ?INSULIN SYRINGE .5CC/29G 29G X 1/2" 0.5 ML Misc ?  ?isosorbide mononitrate 30 MG 24 hr tablet ?Commonly known as: IMDUR ?Take 1 tablet (30 mg total) by mouth daily. ?  ?loratadine 10 MG tablet ?Commonly known as: CLARITIN ?Take 10 mg by mouth every morning. ?  ?Microlet Lancets Misc ?  ?multivitamins ther. w/minerals Tabs tablet ?Take 1 tablet by mouth daily. ?  ?nitroGLYCERIN 0.4 MG SL tablet ?Commonly known as: NITROSTAT ?DISSOLVE 1 TABLET UNDER TONGUE EVERY 5 MINUTES UP TO [place]. IF NO RELIEF CALL 911. ?What changed: See the new instructions. ?  ?pantoprazole 40 MG tablet ?Commonly known as: PROTONIX ?Take 40 mg by mouth daily. ?  ?potassium chloride SA 20 MEQ tablet ?Commonly known as: KLOR-CON M ?Take 20 mEq  by mouth See admin instructions. Take 40 meq in the morning and 20 meq every evening. ?  ?Rivaroxaban 15 MG Tabs tablet ?Commonly known as: XARELTO ?Take 1 tablet (15 mg total) by mouth daily with supper. ?What changed:  ?medication strength ?how much to take ?  ?SLOW-MAG PO ?Take 1 tablet by mouth every morning. ?  ?sodium bicarbonate 650 MG tablet ?Take 650 mg by mouth 3 (three) times daily. ?  ?Sure Comfort Pen Needles 29G X 12.7MM Misc ?Generic drug: Insulin Pen Needle ?USE AS DIRECTED FOURCTIMES DAILY. ?  ?torsemide 20 MG tablet ?Commonly known as: DEMADEX ?Take 40 mg in the morning and 20 mg every evening . ?What changed:  ?how much to take ?how to take this ?when to take this ?  ? ?  ? ? Follow-up Information   ? ? Imogene Burn, PA-C Follow up on 09/23/2021.   ?Specialty: Cardiology ?Why: Cardiology Hospital Follow-up on 09/23/2021 at 1:00 PM. ?Contact  information: ?[address] ?[place] [address] ?[phone] ? ? ?  ?  ? ?  ?  ? ?  ? ?Allergies  ?Allergen Reactions  ? Beef-Derived Products Diarrhea  ? Pork-Derived Products Diarrhea  ? Other   ?

## 2021-08-15 NOTE — Progress Notes (Signed)
?[facility] ?Progress Note  ? ? ?Assessment/ Plan:   ?1. Acute kidney Injury on advanced CKD ?-transient increase in Cr likely related to diuretics at that time, now improving with diuresis. CKD secondary to obesity/HTN/DM/MM/history of repeated AKIs. Baseline Cr seems to range around 3. Follows with [name] ([place]). Fortunately Cr is stable/slightly better at this time ?-no indication for renal replacement therapy at this time especially since it seems that his volume status is improving. Not exhibiting any uremic symptoms at this time ?-will need follow up with [facility] 1-2 weeks post discharge ?2. HFrEF, acute on chronic systolic CHF exacerbation ?-has been having excess fluid intake prior to admit, discussed fluid restriction/hydrate to thirst. Currently on lasix '80mg'$  IV BID. BNP improving. Can likely transition back to home torsemide dose today, will defer to primary and cardiology ?3. Anemia of chronic disease ?-transfuse for hgb <7. S/p Aranesp 140mg 3/14. Receiving Fe IV ?4. HTN/volume ?-responding well to diuretics ?5. Secondary hyperparathyroidism ?-phos at goal 3/14 ?6. DM2 ?-per primary service ? ?Subjective:   ?No acute events. Patient reports that he continues to feel well. Was able to ambulate up and down the halls yesterday with no DOE. No other complaints today.  ? ?Objective:   ?BP 131/71   Pulse 61   Temp 98.5 ?F (36.9 ?C) (Oral)   Resp 16   Ht '6\' 2"'$  (1.88 m)   Wt (!) 161.4 kg   SpO2 99%   BMI 45.68 kg/m?  ? ?Intake/Output Summary (Last 24 hours) at 08/15/2021 0834 ?Last data filed at 08/15/2021 00254?Gross per 24 hour  ?Intake --  ?Output 3550 ml  ?Net -3550 ml  ? ?Weight change: -2.757 kg ? ?Physical Exam: ?Gen:nad, laying flat in bed ?CVS:s1s2, rrr ?Resp:diminished air entry bibasilar (body habitus) otherwise no overt w/r/r/c ?AYHC:WCBJS soft ?EEGB:TDVVOpitting edema b/l Les ?Neuro: awake alert ? ?Imaging: ?DG Chest 2 View ? ?Result Date: 08/14/2021 ?CLINICAL DATA:   Shortness of breath and cough EXAM: CHEST - 2 VIEW COMPARISON:  Radiograph 08/12/2021 FINDINGS: Unchanged cardiomediastinal silhouette. Unchanged bowel containing left-sided diaphragmatic hernia with adjacent left basilar atelectasis. There are streaky opacities in the right mid to lower lung and mild pulmonary vascular congestion. No new airspace disease. No acute osseous abnormality. Unchanged probable calcified joint body along the right glenohumeral joint. IMPRESSION: Stable pulmonary vascular congestion with streaky opacities in the right mid to lower lung, likely interstitial edema. Unchanged bowel containing left-sided diaphragmatic hernia with adjacent left basilar atelectasis. Electronically Signed   By: JMaurine SimmeringM.D.   On: 08/14/2021 08:29   ? ?Labs: ?BMET ?Recent Labs  ?Lab 08/10/21 ?1650 08/11/21 ?0160703/13/23 ?0507 08/13/21 ?0371003/15/23 ?0626903/16/23 ?04854 ?NA 139 140 138 142 143 145  ?K 4.3 4.6 4.3 4.3 4.1 4.1  ?CL 105 108 106 108 108 109  ?CO2 '23 25 25 24 26 24  '$ ?GLUCOSE 171* 147* 141* 142* 123* 120*  ?BUN 85* 87* 88* 88* 84* 81*  ?CREATININE 3.50* 3.57* 3.61* 3.75* 3.59* 3.53*  ?CALCIUM 8.8* 8.6* 8.4* 8.3* 8.3* 8.7*  ?PHOS  --   --  3.6 4.0  --   --   ? ?CBC ?Recent Labs  ?Lab 08/10/21 ?1650 08/12/21 ?0507 08/13/21 ?0627003/15/23 ?03500 ?WBC 8.4 6.8 6.2 5.5  ?HGB 11.4* 8.9* 9.0* 9.1*  ?HCT 35.3* 29.2* 29.2* 30.3*  ?MCV 101.1* 101.4* 102.5* 101.7*  ?PLT 137* 120* 137* 147*  ? ? ?Medications:   ? ? atorvastatin  80 mg Oral  QHS  ? doxycycline  100 mg Oral Q12H  ? furosemide  80 mg Intravenous BID  ? hydrALAZINE  100 mg Oral TID  ? insulin aspart  0-15 Units Subcutaneous TID WC  ? insulin aspart  0-5 Units Subcutaneous QHS  ? isosorbide mononitrate  30 mg Oral Daily  ? lipase/protease/amylase  36,000 Units Oral TID AC  ? potassium chloride  40 mEq Oral BID  ? rivaroxaban  15 mg Oral Q supper  ? ? ? ? ?Gean Quint, MD ?[facility] ?08/15/2021, 8:34 AM  ? ?

## 2021-08-15 NOTE — Progress Notes (Signed)
? ?Progress Note ? ?Patient Name: [name] ?Date of Encounter: 08/15/2021 ? ?[facility] Cardiologist: Norvell Caswell ? ?Subjective  ? ?SOB has resolved ? ?Inpatient Medications  ?  ?Scheduled Meds: ? atorvastatin  80 mg Oral QHS  ? doxycycline  100 mg Oral Q12H  ? furosemide  80 mg Intravenous BID  ? hydrALAZINE  100 mg Oral TID  ? insulin aspart  0-15 Units Subcutaneous TID WC  ? insulin aspart  0-5 Units Subcutaneous QHS  ? isosorbide mononitrate  30 mg Oral Daily  ? lipase/protease/amylase  36,000 Units Oral TID AC  ? potassium chloride  40 mEq Oral BID  ? rivaroxaban  15 mg Oral Q supper  ? ?Continuous Infusions: ? cefTRIAXone (ROCEPHIN)  IV 2 g (08/14/21 2256)  ? ferric gluconate (FERRLECIT) IVPB 250 mg (08/14/21 0834)  ? ?PRN Meds: ?acetaminophen, ondansetron **OR** ondansetron (ZOFRAN) IV  ? ?Vital Signs  ?  ?Vitals:  ? 08/14/21 2242 08/14/21 2326 08/15/21 0500 08/15/21 0532  ?BP: (!) 145/76   131/71  ?Pulse: 63 86  61  ?Resp:  16    ?Temp: 98.8 ?F (37.1 ?C)   98.5 ?F (36.9 ?C)  ?TempSrc: Oral   Oral  ?SpO2: 100% 98%  99%  ?Weight:   (!) 161.4 kg   ?Height:      ? ? ?Intake/Output Summary (Last 24 hours) at 08/15/2021 0917 ?Last data filed at 08/15/2021 9407 ?Gross per 24 hour  ?Intake --  ?Output 3100 ml  ?Net -3100 ml  ? ?Last 3 Weights 08/15/2021 08/14/2021 08/13/2021  ?Weight (lbs) 355 lb 13.2 oz 361 lb 14.4 oz 361 lb 14.4 oz  ?Weight (kg) 161.4 kg 164.157 kg 164.157 kg  ?   ? ?Telemetry  ?  ?SR - Personally Reviewed ? ?ECG  ?  ?N/a - Personally Reviewed ? ?Physical Exam  ?GEN: No acute distress.   ?Neck: No JVD ?Cardiac: RRR, no murmurs, rubs, or gallops.  ?Respiratory: Clear to auscultation bilaterally. ?GI: Soft, nontender, non-distended  ?MS: No edema; No deformity. ?Neuro:  Nonfocal  ?Psych: Normal affect  ? ?Labs  ?  ?High Sensitivity Troponin:   ?Recent Labs  ?Lab 08/10/21 ?1650 08/10/21 ?1838  ?TROPONINIHS 34* 34*  ?   ?Chemistry ?Recent Labs  ?Lab 08/12/21 ?0507 08/13/21 ?6808 08/14/21 ?8110  08/15/21 ?3159  ?NA 138 142 143 145  ?K 4.3 4.3 4.1 4.1  ?CL 106 108 108 109  ?CO2 '25 24 26 24  ' ?GLUCOSE 141* 142* 123* 120*  ?BUN 88* 88* 84* 81*  ?CREATININE 3.61* 3.75* 3.59* 3.53*  ?CALCIUM 8.4* 8.3* 8.3* 8.7*  ?MG  --   --   --  2.3  ?ALBUMIN 2.7* 2.8*  --   --   ?GFRNONAA 18* 17* 18* 19*  ?ANIONGAP '7 10 9 12  ' ?  ?Lipids No results for input(s): CHOL, TRIG, HDL, LABVLDL, LDLCALC, CHOLHDL in the last 168 hours.  ?Hematology ?Recent Labs  ?Lab 08/12/21 ?0507 08/13/21 ?4585 08/14/21 ?9292  ?WBC 6.8 6.2 5.5  ?RBC 2.88* 2.85* 2.98*  ?HGB 8.9* 9.0* 9.1*  ?HCT 29.2* 29.2* 30.3*  ?MCV 101.4* 102.5* 101.7*  ?MCH 30.9 31.6 30.5  ?MCHC 30.5 30.8 30.0  ?RDW 15.5 15.2 15.1  ?PLT 120* 137* 147*  ? ?Thyroid No results for input(s): TSH, FREET4 in the last 168 hours.  ?BNP ?Recent Labs  ?Lab 08/10/21 ?1652 08/14/21 ?4462  ?BNP 475.0* 330.0*  ?  ?DDimer No results for input(s): DDIMER in the last 168 hours.  ? ?Radiology  ?  ?  DG Chest 2 View ? ?Result Date: 08/14/2021 ?CLINICAL DATA:  Shortness of breath and cough EXAM: CHEST - 2 VIEW COMPARISON:  Radiograph 08/12/2021 FINDINGS: Unchanged cardiomediastinal silhouette. Unchanged bowel containing left-sided diaphragmatic hernia with adjacent left basilar atelectasis. There are streaky opacities in the right mid to lower lung and mild pulmonary vascular congestion. No new airspace disease. No acute osseous abnormality. Unchanged probable calcified joint body along the right glenohumeral joint. IMPRESSION: Stable pulmonary vascular congestion with streaky opacities in the right mid to lower lung, likely interstitial edema. Unchanged bowel containing left-sided diaphragmatic hernia with adjacent left basilar atelectasis. Electronically Signed   By: Maurine Simmering M.D.   On: 08/14/2021 08:29   ? ?Cardiac Studies  ? ? ?Patient Profile  ?   ?61 y.o. male with a hx of CAD with remote stenting, chronic systolic and diastolic heart failure, ?ICM (EF 30-35%), CKD stage 4, multiple myeloma,  bradycardia on BB, chronic anemia, paroxysmal Aflutter on a/c, OSA on CPAP, obesity, HTN, HLD who is being seen 08/12/2021 for the evaluation of acute heart failure.  ? ?Assessment & Plan  ?  ?1.Acute on chronic systolic HF ?- - several year history of chronic systolic HF, ICM ?- medical therapy limited. Bradycardia on beta blockers now off. Significant renal dysfunction ?  ?- 07/2021 echo: LVE 35-40%, normal RV ?- incomplete I/Os yesterday, roughly neg 11.2 L total this admission. Weight appear inaccurate He is in IV lasix 59m bid, Cr is downtrending with diuresis consistent with venous congestion and HF. ?- medical therapy limited by bradycardia, renal dysfunction. Continue hydral 1059mtid, imdur 30 ? ?- symptoms resolved, appears euvolemic Start torsemide 4041mn AM and 58m75m PM ?- ok for discharge from cardiac standpoint ?  ?2.Aflutter ?- self rate controlled, previous bradycardia on beta blockers ?- continue xarleto 15 mg daily ?Estimated Creatinine Clearance: 35 mL/min (A) (by C-G formula based on SCr of 3.53 mg/dL (H)). ? ?  ?3. CKD IV ?- per renal ?- Cr trending down some with diuresis ?  ?4. History of CAD prior interventions ?- mild flat trop in setting of HF and advanced CKD ?- continue medical therapy ? ?Ok [employer] discharge from cardiac standpoint, we will sign off ? ? ? ?For questions or updates, please contact [facility] consult [email or web address] for contact info under  ? ?  ?   ?Signed, ?BranCarlyle Dolly  ?08/15/2021, 9:17 AM   ? ?

## 2021-08-15 NOTE — Plan of Care (Signed)
?  Problem: Education: ?Goal: Ability to demonstrate management of disease process will improve ?Outcome: Progressing ?  ?Problem: Activity: ?Goal: Capacity to carry out activities will improve ?Outcome: Progressing ?  ?Problem: Cardiac: ?Goal: Ability to achieve and maintain adequate cardiopulmonary perfusion will improve ?Outcome: Progressing ?  ?

## 2021-08-15 NOTE — Discharge Instructions (Signed)
IMPORTANT INFORMATION: PAY CLOSE ATTENTION  ? ?PHYSICIAN DISCHARGE INSTRUCTIONS ? ?Follow with Primary care provider  [name], MD  and other consultants as instructed by your Hospitalist Physician ? ?SEEK MEDICAL CARE OR RETURN TO EMERGENCY ROOM IF SYMPTOMS COME BACK, WORSEN OR NEW PROBLEM DEVELOPS  ? ?Please note: ?You were cared for by a hospitalist during your hospital stay. Every effort will be made to forward records to your primary care provider.  You can request that your primary care provider send for your hospital records if they have not received them.  Once you are discharged, your primary care physician will handle any further medical issues. Please note that NO REFILLS for any discharge medications will be authorized once you are discharged, as it is imperative that you return to your primary care physician (or establish a relationship with a primary care physician if you do not have one) for your post hospital discharge needs so that they can reassess your need for medications and monitor your lab values. ? ?Please get a complete blood count and chemistry panel checked by your Primary MD at your next visit, and again as instructed by your Primary MD. ? ?Get Medicines reviewed and adjusted: ?Please take all your medications with you for your next visit with your Primary MD ? ?Laboratory/radiological data: ?Please request your Primary MD to go over all hospital tests and procedure/radiological results at the follow up, please ask your primary care provider to get all Hospital records sent to his/her office. ? ?In some cases, they will be blood work, cultures and biopsy results pending at the time of your discharge. Please request that your primary care provider follow up on these results. ? ?If you are diabetic, please bring your blood sugar readings with you to your follow up appointment with primary care.   ? ?Please call and make your follow up appointments as soon as possible.   ? ?Also Note the  following: ?If you experience worsening of your admission symptoms, develop shortness of breath, life threatening emergency, suicidal or homicidal thoughts you must seek medical attention immediately by calling 911 or calling your MD immediately  if symptoms less severe. ? ?You must read complete instructions/literature along with all the possible adverse reactions/side effects for all the Medicines you take and that have been prescribed to you. Take any new Medicines after you have completely understood and accpet all the possible adverse reactions/side effects.  ? ?Do not drive when taking Pain medications or sleeping medications (Benzodiazepines) ? ?Do not take more than prescribed Pain, Sleep and Anxiety Medications. It is not advisable to combine anxiety,sleep and pain medications without talking with your primary care practitioner ? ?Special Instructions: If you have smoked or chewed Tobacco  in the last 2 yrs please stop smoking, stop any regular Alcohol  and or any Recreational drug use. ? ?Wear Seat belts while driving.  Do not drive if taking any narcotic, mind altering or controlled substances or recreational drugs or alcohol.  ? ? ? ? ? ?

## 2021-08-15 NOTE — Progress Notes (Signed)
Discharge instructions reviewed with patient and wife. Scripts sent to pharmacy and follow up appointments made.  Wife here to give ride home.   ?

## 2021-08-22 ENCOUNTER — Encounter (HOSPITAL_COMMUNITY): Payer: Self-pay

## 2021-08-22 ENCOUNTER — Encounter (HOSPITAL_COMMUNITY)
Admission: RE | Admit: 2021-08-22 | Discharge: 2021-08-22 | Disposition: A | Discharge status: Home / Self Care | Payer: BC Managed Care – PPO | Source: Ambulatory Visit | Attending: Nephrology | Admitting: Nephrology

## 2021-08-22 DIAGNOSIS — D631 Anemia in chronic kidney disease: Secondary | ICD-10-CM | POA: Diagnosis not present

## 2021-08-22 DIAGNOSIS — N184 Chronic kidney disease, stage 4 (severe): Secondary | ICD-10-CM | POA: Diagnosis present

## 2021-08-22 LAB — POCT HEMOGLOBIN-HEMACUE: Hemoglobin: 11.1 g/dL — ABNORMAL LOW (ref 13.0–17.0)

## 2021-08-22 MED ORDER — EPOETIN ALFA-EPBX 10000 UNIT/ML IJ SOLN: 10000.0000 [IU] | Freq: Once | INTRAMUSCULAR | Status: DC

## 2021-08-29 ENCOUNTER — Telehealth: Payer: Self-pay | Admitting: Cardiology

## 2021-08-29 ENCOUNTER — Encounter: Payer: Self-pay | Admitting: Cardiology

## 2021-08-29 NOTE — Telephone Encounter (Signed)
to [facility]riage (supporting Harl Bowie, Alphonse Guild, MD)   ?  12:07 PM ?August 20, 2021- weight: 358 lbs. ?March 30,2023-Weight 364 lbs. ?Do I need an adjustment in medications or what should I need to do? ?Request call back please. ?[phone] ?or message. ?Thanks ?[name] ? ? ? ?I will forward to Dr.Branch for dispo ?

## 2021-08-29 NOTE — Telephone Encounter (Signed)
Pt c/o swelling: STAT is pt has developed SOB within 24 hours ? ?How much weight have you gained and in what time span? 6lbs from 21st till today ? ?If swelling, where is the swelling located? Legs and feet ? ?Are you currently taking a fluid pill? yes ? ?Are you currently SOB? no ? ?Do you have a log of your daily weights (if so, list)?   ? ?Have you gained 3 pounds in a day or 5 pounds in a week? yes ? ?Have you traveled recently? No ? ?Wanted to see if adjustment need to be made in his medication. ? ?

## 2021-08-30 NOTE — Telephone Encounter (Signed)
Clarify taking torsemide '40mg'$  in AM and '20mg'$  in pm, if so increase to '60mg'$  in AM and '40mg'$  in PM over the weekend and update [place] Monday on weights ? ? ?Zandra Abts MD ?

## 2021-08-30 NOTE — Telephone Encounter (Signed)
I clarified that patient is taking torsemide 40 am and 20 mg pm ? ?He will take torsemide to 60 mg am ans 40 mg pm over the weekend and will call us Monday with weight update. ?

## 2021-09-02 ENCOUNTER — Telehealth: Payer: Self-pay | Admitting: Cardiology

## 2021-09-02 NOTE — Telephone Encounter (Signed)
Spoke to pt who agreed to continue increased torsemide dose. Pt will update office on Friday morning of current weights.  ?

## 2021-09-02 NOTE — Telephone Encounter (Signed)
Follow Up: ? ? ?Patient's medicine was changed. He called in today(09-02-21) to report his weight. Today his weight is 362. ?

## 2021-09-02 NOTE — Telephone Encounter (Signed)
WEight are moving back down in the right direction, he had called last week and was up to 364 and today he is reporting 363 lbs. Continue the increased torsemide, udpate [place] again at the end of the week. Likely repeat a bmet/mg after he calls and updates [place] at end of week ? ? ?Zandra Abts MD ?

## 2021-09-02 NOTE — Telephone Encounter (Signed)
Called pt for more information. No answer. Left msg to call back.  ?

## 2021-09-05 ENCOUNTER — Encounter (HOSPITAL_COMMUNITY): Payer: BC Managed Care – PPO

## 2021-09-05 ENCOUNTER — Encounter (HOSPITAL_COMMUNITY)
Admission: RE | Admit: 2021-09-05 | Discharge: 2021-09-05 | Disposition: A | Discharge status: Home / Self Care | Payer: BC Managed Care – PPO | Source: Ambulatory Visit | Attending: Nephrology | Admitting: Nephrology

## 2021-09-05 DIAGNOSIS — N184 Chronic kidney disease, stage 4 (severe): Secondary | ICD-10-CM | POA: Insufficient documentation

## 2021-09-05 DIAGNOSIS — D631 Anemia in chronic kidney disease: Secondary | ICD-10-CM | POA: Insufficient documentation

## 2021-09-11 NOTE — Progress Notes (Signed)
? ?Cardiology Office Note   ? ?Date:  09/23/2021  ? ?ID:  [name], DOB 1960-08-17, MRN [id number] ? ? ?PCP:  [name], MD ?  ?[facility]  ?Cardiologist:  Kate Sable, MD (Inactive)   ?Advanced Practice Provider:  No care team member to display ?Electrophysiologist:  None  ? ?[id number]}  ? ?Chief Complaint  ?Patient presents with  ? Follow-up  ? ? ?History of Present Illness:  ?[name] is a 61 y.o. male  with a hx of CAD with remote stenting pLAD & Dx 8182, chronic systolic and diastolic heart failure, ICM (EF 30-35%)-not felt to be ICD candidate, CKD stage 4, multiple myeloma, bradycardia on BB, chronic anemia, paroxysmal Aflutter on a/c, OSA on CPAP, obesity, HTN, HLD   ? ?Patient admitted with acute on chronic systolic CHF 01/9370 LVEF 35 to 40%.  Beta-blocker stopped due to bradycardia. Weight going back up so torsemide increased 60 mg am 40 mg pm.  ? ?Patient comes in for f/u. Watching salt and fluid intake closely. Crt 3.53 08/15/21 followed by Dr. Josephine Cables blood work this am. Has ulcer on his foot and trouble walking. Breathing much better. Denies chest pain. Weight down almost 40 lbs. ? ? ? ? ?Past Medical History:  ?Diagnosis Date  ? Abscess 04/2017  ? Scrotal  ? Anemia   ? Arteriosclerotic cardiovascular disease (ASCVD)   ? MI-2000s; stent to the proximal LAD and diagonal in 2001; stress nuclear in 2008-impaired exercise capacity, left ventricular dilatation, moderately to severely depressed EF, apical, inferior and anteroseptal scar  ? Arthritis   ? Atrial flutter ([place])   ? Bence-Jones proteinuria 05/05/2011  ? Cellulitis of leg   ? both legs  ? Chronic diarrhea   ? Chronic kidney disease, stage 3, mod decreased GFR (HCC)   ? Creatinine of 1.84 in 06/2011 and 1.5 in 07/2011  ? Diabetes mellitus   ? Insulin  ? Dysrhythmia   ? AFlutter  ? GERD (gastroesophageal reflux disease)   ? Gout   ? Hyperlipidemia   ? Hypertension   ? Injection site reaction   ?  Multiple myeloma 07/01/2011  ? Myocardial infarction [facility]) 2000  ? Obesity   ? Pedal edema   ? Venous insufficiency  ? Sleep apnea   ? uses cpap  ? Ulcer   ? ? ?Past Surgical History:  ?Procedure Laterality Date  ? ABSCESS DRAINAGE    ? Scrotal  ? BIOPSY  01/02/2012  ? Procedure: BIOPSY;  Surgeon: Rogene Houston, MD;  [facility];  Service: Endoscopy;  Laterality: N/A;  ? BONE MARROW BIOPSY  05/13/11  ? CARDIAC CATHETERIZATION    ? cardiac stent  ? CARDIOVERSION N/A 10/13/2012  ? Procedure: CARDIOVERSION;  Surgeon: Yehuda Savannah, MD;  Location: [facility];  Service: Cardiovascular;  Laterality: N/A;  ? CATARACT EXTRACTION W/PHACO Left 02/13/2014  ? Procedure: CATARACT EXTRACTION PHACO AND INTRAOCULAR LENS PLACEMENT (IOC);  Surgeon: Tonny Branch, MD;  Location: [facility];  Service: Ophthalmology;  Laterality: Left;  CDE:  7.67  ? CATARACT EXTRACTION W/PHACO Right 03/02/2014  ? Procedure: CATARACT EXTRACTION PHACO AND INTRAOCULAR LENS PLACEMENT RIGHT EYE CDE=16.81;  Surgeon: Tonny Branch, MD;  Location: [facility];  Service: Ophthalmology;  Laterality: Right;  ? COLONOSCOPY  11/28/2011  ? Procedure: COLONOSCOPY;  Surgeon: Rogene Houston, MD;  [facility];  Service: Endoscopy;  Laterality: N/A;  930  ? CYSTOSCOPY W/ RETROGRADES Right 12/16/2017  ? Procedure: CYSTOSCOPY  WITH RIGHT RETROGRADE PYELOGRAM AND RIGHT URETERAL STENT REMOVAL;  Surgeon: Cleon Gustin, MD;  Location: [facility];  Service: Urology;  Laterality: Right;  ? CYSTOSCOPY W/ URETERAL STENT PLACEMENT Right 07/31/2017  ? Procedure: CYSTOSCOPY WITH RETROGRADE PYELOGRAM/URETERAL STENT PLACEMENT;  Surgeon: Cleon Gustin, MD;  [facility]: [facility];  Service: Urology;  Laterality: Right;  ? ESOPHAGOGASTRODUODENOSCOPY  01/02/2012  ? Procedure: ESOPHAGOGASTRODUODENOSCOPY (EGD);  Surgeon: Rogene Houston, MD;  [facility];  Service: Endoscopy;  Laterality: N/A;  100  ? ESOPHAGOGASTRODUODENOSCOPY N/A 09/20/2012  ? Procedure:  ESOPHAGOGASTRODUODENOSCOPY (EGD);  Surgeon: Rogene Houston, MD;  [facility];  Service: Endoscopy;  Laterality: N/A;  ? EUS N/A 10/07/2012  ? Procedure: UPPER ENDOSCOPIC ULTRASOUND (EUS) LINEAR;  Surgeon: Milus Banister, MD;  [facility];  Service: Endoscopy;  Laterality: N/A;  ? INCISION AND DRAINAGE ABSCESS N/A 04/14/2017  ? Procedure: INCISION AND DRAINAGE ABSCESS;  Surgeon: Ceasar Mons, MD;  [facility];  Service: Urology;  Laterality: N/A;  ? INCISION AND DRAINAGE ABSCESS ANAL    ? IRRIGATION AND DEBRIDEMENT ABSCESS N/A 04/15/2017  ? Procedure: DEBRIDEMENT SCROTAL WOUND AND DRESSING CHANGE;  Surgeon: Ceasar Mons, MD;  [facility];  Service: Urology;  Laterality: N/A;  ? IRRIGATION AND DEBRIDEMENT ABSCESS N/A 04/17/2017  ? Procedure: IRRIGATION AND DEBRIDEMENT ABSCESS;  Surgeon: Ceasar Mons, MD;  [facility];  Service: Urology;  Laterality: N/A;  RM 3  ? LAPAROSCOPIC GASTRIC BANDING  2006  ? has been removed  ? OSTECTOMY Right 04/08/2017  ? Procedure: OSTECTOMY RIGHT GREAT TOE;  Surgeon: Caprice Beaver, DPM;  [facility]: [facility];  Service: Podiatry;  Laterality: Right;  ? PORT-A-CATH REMOVAL Left 12/07/2012  ? Procedure: REMOVAL PORT-A-CATH;  Surgeon: Scherry Ran, MD;  Location: [facility];  Service: General;  Laterality: Left;  ? PORT-A-CATH REMOVAL N/A 10/02/2017  ? Procedure: MINOR REMOVAL PORT-A-CATH AT BEDSIDE;  Surgeon: Donnie Mesa, MD;  Location: [facility];  Service: General;  Laterality: N/A;  ? PORTACATH PLACEMENT  07/07/2011  ? Procedure: INSERTION PORT-A-CATH;  Surgeon: Scherry Ran, MD;  [facility]: [facility];  Service: General;  Laterality: N/A;  ? PORTACATH PLACEMENT N/A 12/07/2012  ? Procedure: INSERTION PORT-A-CATH;  Surgeon: Scherry Ran, MD;  Location: [facility];  Service: General;  Laterality: N/A;  Attempted portacath placement on left and right side  ? URETEROSCOPY Right 12/16/2017  ? Procedure: DIAGNOSTIC RIGHT  URETEROSCOPY;  Surgeon: Cleon Gustin, MD;  [facility];  Service: Urology;  Laterality: Right;  ? WOUND DEBRIDEMENT Right 04/08/2017  ? Procedure: EXCISION ULCERATION RIGHT GREAT TOE;  Surgeon: Caprice Beaver, DPM;  [facility];  Service: Podiatry;  Laterality: Right;  ? WRIST SURGERY    ? Left; removal of bone fragment  ? ? ?Current Medications: ?Current Meds  ?Medication Sig  ? acetaminophen (TYLENOL) 500 MG tablet Take 1,000 mg by mouth every 6 (six) hours as needed.  ? acyclovir (ZOVIRAX) 400 MG tablet Take 1 tablet (400 mg total) by mouth every morning.  ? allopurinol (ZYLOPRIM) 300 MG tablet TAKE ONE TABLET BY MOUTH ONCE DAILY.  ? atorvastatin (LIPITOR) 80 MG tablet TAKE ONE TABLET BY MOUTH AT BEDTIME.  ? Blood Glucose Monitoring Suppl (ACCU-CHEK GUIDE) w/Device KIT USE TO CHECK BLOOD SUGARSITWICE DAILY.  ? calcitRIOL (ROCALTROL) 0.25 MCG capsule Take 0.25 mcg by mouth 3 (three) times a week. Monday, Wednesday and Friday  ? Cholecalciferol (D2000 ULTRA STRENGTH) 50 MCG (2000 UT)  CAPS Take by mouth.  ? Ciclopirox 0.77 % gel SMARTSIG:Sparingly Topical Twice Daily  ? CONTOUR NEXT TEST test strip   ? CREON 36000-114000 units CPEP capsule Take 36,000 Units by mouth 3 (three) times daily as needed.  ? dicyclomine (BENTYL) 20 MG tablet TAKE 1 TABLET BY MOUTH BEFORE MEALS 3 TIMES DAILY.  ? Eluxadoline 100 MG TABS Take 100 mg by mouth 2 (two) times daily with a meal.  ? ferrous sulfate 325 (65 FE) MG EC tablet Take 45 mg by mouth. Every other day  ? gabapentin (NEURONTIN) 600 MG tablet TAKE 1 TABLET BY MOUTH ONCE A DAY.  ? hydrALAZINE (APRESOLINE) 100 MG tablet Take 1 tablet (100 mg total) by mouth 3 (three) times daily.  ? insulin aspart (NOVOLOG) 100 UNIT/ML injection Inject 1-3 Units into the skin 3 (three) times daily with meals. Sliding scale over 150= 1 unit, 200= 2 units,250= 3 units  ? insulin detemir (LEVEMIR) 100 UNIT/ML injection Inject 0.1 mLs (10 Units total) into the skin every  morning.  ? INSULIN SYRINGE .5CC/29G 29G X 1/2" 0.5 ML MISC   ? isosorbide mononitrate (IMDUR) 30 MG 24 hr tablet Take 1 tablet (30 mg total) by mouth daily.  ? loratadine (CLARITIN) 10 MG tablet Take 10 mg by mouth every morning.  ? Mag

## 2021-09-19 ENCOUNTER — Encounter (HOSPITAL_COMMUNITY)
Admission: RE | Admit: 2021-09-19 | Discharge: 2021-09-19 | Disposition: A | Discharge status: Home / Self Care | Payer: BC Managed Care – PPO | Source: Ambulatory Visit | Attending: Nephrology | Admitting: Nephrology

## 2021-09-19 ENCOUNTER — Encounter (HOSPITAL_COMMUNITY): Payer: Self-pay

## 2021-09-19 DIAGNOSIS — N184 Chronic kidney disease, stage 4 (severe): Secondary | ICD-10-CM | POA: Diagnosis present

## 2021-09-19 DIAGNOSIS — D631 Anemia in chronic kidney disease: Secondary | ICD-10-CM | POA: Diagnosis not present

## 2021-09-19 LAB — POCT HEMOGLOBIN-HEMACUE: Hemoglobin: 10.7 g/dL — ABNORMAL LOW (ref 13.0–17.0)

## 2021-09-19 MED ORDER — EPOETIN ALFA-EPBX 10000 UNIT/ML IJ SOLN: 10000.0000 [IU] | Freq: Once | INTRAMUSCULAR | Status: DC

## 2021-09-23 ENCOUNTER — Ambulatory Visit (INDEPENDENT_AMBULATORY_CARE_PROVIDER_SITE_OTHER): Payer: BC Managed Care – PPO | Admitting: Physician Assistant

## 2021-09-23 ENCOUNTER — Encounter: Payer: Self-pay | Admitting: Physician Assistant

## 2021-09-23 VITALS — BP 144/82 | HR 60 | Ht 74.0 in | Wt 361.0 lb

## 2021-09-23 DIAGNOSIS — I4892 Unspecified atrial flutter: Secondary | ICD-10-CM

## 2021-09-23 DIAGNOSIS — I5022 Chronic systolic (congestive) heart failure: Secondary | ICD-10-CM | POA: Diagnosis not present

## 2021-09-23 DIAGNOSIS — I2510 Atherosclerotic heart disease of native coronary artery without angina pectoris: Secondary | ICD-10-CM

## 2021-09-23 DIAGNOSIS — I10 Essential (primary) hypertension: Secondary | ICD-10-CM

## 2021-09-23 DIAGNOSIS — G4733 Obstructive sleep apnea (adult) (pediatric): Secondary | ICD-10-CM

## 2021-09-23 DIAGNOSIS — N184 Chronic kidney disease, stage 4 (severe): Secondary | ICD-10-CM | POA: Diagnosis not present

## 2021-09-23 DIAGNOSIS — Z9989 Dependence on other enabling machines and devices: Secondary | ICD-10-CM

## 2021-09-23 DIAGNOSIS — E785 Hyperlipidemia, unspecified: Secondary | ICD-10-CM

## 2021-09-23 NOTE — Patient Instructions (Addendum)
Medication Instructions:  ?Your physician recommends that you continue on your current medications as directed. Please refer to the Current Medication list given to you today. ? ? ?Labwork: ?None today ? ?Testing/Procedures: ?None today ? ?Follow-Up: ?Keep 10/17/21 apt with Dr.Branch ? ?Any Other Special Instructions Will Be Listed Below (If Applicable). ? ?If you need a refill on your cardiac medications before your next appointment, please call your pharmacy. ? ? ? ?Two Gram Sodium Diet 2000 mg ? ?What is Sodium? Sodium is a mineral found naturally in many foods. The most significant source of sodium in the diet is table salt, which is about 40% sodium.  Processed, convenience, and preserved foods also contain a large amount of sodium.  The body needs only 500 mg of sodium daily to function,  A normal diet provides more than enough sodium even if you do not use salt. ? ?Why Limit Sodium? A build up of sodium in the body can cause thirst, increased blood pressure, shortness of breath, and water retention.  Decreasing sodium in the diet can reduce edema and risk of heart attack or stroke associated with high blood pressure.  Keep in mind that there are many other factors involved in these health problems.  Heredity, obesity, lack of exercise, cigarette smoking, stress and what you eat all play a role. ? ?General Guidelines: ?Do not add salt at the table or in cooking.  One teaspoon of salt contains over 2 grams of sodium. ?Read food labels ?Avoid processed and convenience foods ?Ask your dietitian before eating any foods not dicussed in the menu planning guidelines ?Consult your physician if you wish to use a salt substitute or a sodium containing medication such as antacids.  Limit milk and milk products to 16 oz (2 cups) per day. ? ?Shopping Hints: ?READ LABELS!! "Dietetic" does not necessarily mean low sodium. ?Salt and other sodium ingredients are often added to foods during processing. ? ? ? ?Menu Planning  Guidelines ?Food Group Choose More Often Avoid  ?Beverages (see also the milk group All fruit juices, low-sodium, salt-free vegetables juices, low-sodium carbonated beverages Regular vegetable or tomato juices, commercially softened water used for drinking or cooking  ?Breads and Cereals Enriched white, wheat, rye and pumpernickel bread, hard rolls and dinner rolls; muffins, cornbread and waffles; most dry cereals, cooked cereal without added salt; unsalted crackers and breadsticks; low sodium or homemade bread crumbs Bread, rolls and crackers with salted tops; quick breads; instant hot cereals; pancakes; commercial bread stuffing; self-rising flower and biscuit mixes; regular bread crumbs or cracker crumbs  ?Desserts and Sweets Desserts and sweets mad with mild should be within allowance Instant pudding mixes and cake mixes  ?Fats Butter or margarine; vegetable oils; unsalted salad dressings, regular salad dressings limited to 1 Tbs; light, sour and heavy cream Regular salad dressings containing bacon fat, bacon bits, and salt pork; snack dips made with instant soup mixes or processed cheese; salted nuts  ?Fruits Most fresh, frozen and canned fruits Fruits processed with salt or sodium-containing ingredient (some dried fruits are processed with sodium sulfites  ? ? ? ? ? ? ?Vegetables Fresh, frozen vegetables and low- sodium canned vegetables Regular canned vegetables, sauerkraut, pickled vegetables, and others prepared in brine; frozen vegetables in sauces; vegetables seasoned with ham, bacon or salt pork  ?Condiments, Sauces, Miscellaneous ? Salt substitute with physician's approval; pepper, herbs, spices; vinegar, lemon or lime juice; hot pepper sauce; garlic powder, onion powder, low sodium soy sauce (1 Tbs.); low sodium condiments (ketchup,  chili sauce, mustard) in limited amounts (1 tsp.) fresh ground horseradish; unsalted tortilla chips, pretzels, potato chips, popcorn, salsa (1/4 cup) Any seasoning made  with salt including garlic salt, celery salt, onion salt, and seasoned salt; sea salt, rock salt, kosher salt; meat tenderizers; monosodium glutamate; mustard, regular soy sauce, barbecue, sauce, chili sauce, teriyaki sauce, steak sauce, Worcestershire sauce, and most flavored vinegars; canned gravy and mixes; regular condiments; salted snack foods, olives, picles, relish, horseradish sauce, catsup  ? ?Food preparation: Try these seasonings ?Meats:    ?Pork Sage, onion Serve with applesauce  ?Chicken Poultry seasoning, thyme, parsley Serve with cranberry sauce  ?Lamb Curry powder, rosemary, garlic, thyme Serve with mint sauce or jelly  ?Veal Marjoram, basil Serve with current jelly, cranberry sauce  ?Beef Pepper, bay leaf Serve with dry mustard, unsalted chive butter  ?[place] leaf, dill Serve with unsalted lemon butter, unsalted parsley butter  ?Vegetables:    ?Asparagus Lemon juice   ?Broccoli Lemon juice   ?Carrots Mustard dressing parsley, mint, nutmeg, glazed with unsalted butter and sugar   ?Green beans Marjoram, lemon juice, nutmeg,dill seed   ?Tomatoes Basil, marjoram, onion   ?Spice /blend for "Salt Shaker" 4 tsp ground thyme ?1 tsp ground sage 3 tsp ground rosemary ?4 tsp ground marjoram  ? ?Test your knowledge ?A product that says "Salt Free" may still contain sodium. True or False ?Garlic Powder and Hot Pepper Sauce an be used as alternative seasonings.True or False ?Processed foods have more sodium than fresh foods.  True or False ?Canned Vegetables have less sodium than froze True or False ? ? ?WAYS TO DECREASE YOUR SODIUM INTAKE ?Avoid the use of added salt in cooking and at the table.  Table salt (and other prepared seasonings which contain salt) is probably one of the greatest sources of sodium in the diet.  Unsalted foods can gain flavor from the sweet, sour, and butter taste sensations of herbs and spices.  Instead of using salt for seasoning, try the following seasonings with the foods listed.   Remember: how you use them to enhance natural food flavors is limited only by your creativity... ?Allspice-Meat, fish, eggs, fruit, peas, red and yellow vegetables ?Almond Extract-Fruit baked goods ?Anise Seed-Sweet breads, fruit, carrots, beets, cottage cheese, cookies (tastes like licorice) ?Basil-Meat, fish, eggs, vegetables, rice, vegetables salads, soups, sauces ?Bay Leaf-Meat, fish, stews, poultry ?Burnet-Salad, vegetables (cucumber-like flavor) ?Caraway Seed-Bread, cookies, cottage cheese, meat, vegetables, cheese, rice ?Cardamon-Baked goods, fruit, soups ?Celery Powder or seed-Salads, salad dressings, sauces, meatloaf, soup, bread.Do not use  celery salt ?Chervil-Meats, salads, fish, eggs, vegetables, cottage cheese (parsley-like flavor) ?Chili Power-Meatloaf, chicken cheese, corn, eggplant, egg dishes ?Chives-Salads cottage cheese, egg dishes, soups, vegetables, sauces ?Cilantro-Salsa, casseroles ?Cinnamon-Baked goods, fruit, pork, lamb, chicken, carrots ?Cloves-Fruit, baked goods, fish, pot roast, green beans, beets, carrots ?Coriander-Pastry, cookies, meat, salads, cheese (lemon-orange flavor) ?Cumin-Meatloaf, fish,cheese, eggs, cabbage,fruit pie (caraway flavor) ?[employer], fruit, eggs, fish, poultry, cottage cheese, vegetables ?Dill Seed-Meat, cottage cheese, poultry, vegetables, fish, salads, bread ?Fennel Seed-Bread, cookies, apples, pork, eggs, fish, beets, cabbage, cheese, Licorice-like flavor ?Garlic-(buds or powder) Salads, meat, poultry, fish, bread, butter, vegetables, potatoes.Do not  use garlic salt ?Ginger-Fruit, vegetables, baked goods, meat, fish, poultry ?Horseradish Root-Meet, vegetables, butter ?Lemon Juice or Extract-Vegetables, fruit, tea, baked goods, fish salads ?Mace-Baked goods fruit, vegetables, fish, poultry (taste like nutmeg) ?Maple Extract-Syrups ?Marjoram-Meat, chicken, fish, vegetables, breads, green salads (taste like Sage) ?Mint-Tea, lamb, sherbet, vegetables,  desserts, carrots, cabbage ?Mustard, Dry or Seed-Cheese, eggs, meats, vegetables, poultry ?  Nutmeg-Baked goods, fruit, chicken, eggs, vegetables, desserts ?Onion Powder-Meat, fish, poultry, vegetables, cheese, egg

## 2021-10-03 ENCOUNTER — Encounter (HOSPITAL_COMMUNITY)
Admission: RE | Admit: 2021-10-03 | Discharge: 2021-10-03 | Disposition: A | Discharge status: Home / Self Care | Payer: BC Managed Care – PPO | Source: Ambulatory Visit | Attending: Nephrology | Admitting: Nephrology

## 2021-10-03 ENCOUNTER — Encounter (HOSPITAL_COMMUNITY): Payer: Self-pay

## 2021-10-03 DIAGNOSIS — N184 Chronic kidney disease, stage 4 (severe): Secondary | ICD-10-CM | POA: Diagnosis not present

## 2021-10-03 DIAGNOSIS — D631 Anemia in chronic kidney disease: Secondary | ICD-10-CM | POA: Diagnosis present

## 2021-10-03 LAB — POCT HEMOGLOBIN-HEMACUE: Hemoglobin: 11 g/dL — ABNORMAL LOW (ref 13.0–17.0)

## 2021-10-03 MED ORDER — EPOETIN ALFA-EPBX 10000 UNIT/ML IJ SOLN: 10000.0000 [IU] | Freq: Once | INTRAMUSCULAR | Status: DC

## 2021-10-17 ENCOUNTER — Encounter: Payer: Self-pay | Admitting: Cardiology

## 2021-10-17 ENCOUNTER — Ambulatory Visit (INDEPENDENT_AMBULATORY_CARE_PROVIDER_SITE_OTHER): Payer: BC Managed Care – PPO | Admitting: Cardiology

## 2021-10-17 ENCOUNTER — Encounter (HOSPITAL_COMMUNITY)
Admission: RE | Admit: 2021-10-17 | Discharge: 2021-10-17 | Disposition: A | Discharge status: Home / Self Care | Payer: BC Managed Care – PPO | Source: Ambulatory Visit | Attending: Nephrology | Admitting: Nephrology

## 2021-10-17 VITALS — BP 138/82 | HR 63 | Ht 74.0 in | Wt 360.2 lb

## 2021-10-17 VITALS — BP 133/77 | HR 51 | Temp 98.4°F | Resp 16

## 2021-10-17 DIAGNOSIS — I2510 Atherosclerotic heart disease of native coronary artery without angina pectoris: Secondary | ICD-10-CM | POA: Diagnosis not present

## 2021-10-17 DIAGNOSIS — N184 Chronic kidney disease, stage 4 (severe): Secondary | ICD-10-CM | POA: Diagnosis not present

## 2021-10-17 DIAGNOSIS — I4892 Unspecified atrial flutter: Secondary | ICD-10-CM | POA: Diagnosis not present

## 2021-10-17 DIAGNOSIS — I10 Essential (primary) hypertension: Secondary | ICD-10-CM

## 2021-10-17 DIAGNOSIS — I5022 Chronic systolic (congestive) heart failure: Secondary | ICD-10-CM | POA: Diagnosis not present

## 2021-10-17 DIAGNOSIS — N1832 Chronic kidney disease, stage 3b: Secondary | ICD-10-CM

## 2021-10-17 LAB — POCT HEMOGLOBIN-HEMACUE: Hemoglobin: 11 g/dL — ABNORMAL LOW (ref 13.0–17.0)

## 2021-10-17 MED ORDER — EPOETIN ALFA-EPBX 10000 UNIT/ML IJ SOLN: 10000.0000 [IU] | Freq: Once | INTRAMUSCULAR | Status: DC

## 2021-10-17 NOTE — Patient Instructions (Signed)
Medication Instructions:  Your physician recommends that you continue on your current medications as directed. Please refer to the Current Medication list given to you today.   Labwork: None  Testing/Procedures: None  Follow-Up: Follow up with Dr. Branch in 4 months.   Any Other Special Instructions Will Be Listed Below (If Applicable).     If you need a refill on your cardiac medications before your next appointment, please call your pharmacy.  

## 2021-10-17 NOTE — Progress Notes (Signed)
Clinical Summary Mr. Nazaire is a 61 y.o.male seen today for follow up of the following medical problems.    1. CAD - prior stents as reported below - no recent chest pains.      2. Chronic systolic HF - 1062 echo LVEF 35-40% - 2015 LVEF 25-30% 2017 LVEF 35-40%   - 07/2019 ehco LVEF 25-30%, grade II diastolic dysfxn. -Limited ACE inhibitor's or angiotensin receptor blockers due to advanced chronic kidney disease - off beta blocker due to bradycardia from prior Dr Raliegh Ip notes.  - GFR <20, not on SGLT2i      10/2020 echo LVEF 30-35%, grade II dk.   07/2021 admission with volume overload - 07/2021 echo: LVE 35-40%, normal RV  -no SOB/DOE. No recent edema - home weights around 360-362 lbs and stable. Has been very stable since hospital discharge - taking torsemide 19m in AM 24min pm         2.Anemia - he is on epogen per renal    3. Aflutter - no recent palpitations.   - CrCl right at 50, he is on xarelto 2022maily       4. CKD IV - followed by Dr BhuTheador Hawthornelast Cr 3.59     5. OSA - compliant with cpap    6. Multiple myeloma - being monitored      7. HTN - 10/02/21 neprhology started aldactone - home bp's 120-130s/70s-80s           Works at [employer]  Semi retired, just sold business.    Past Medical History:  Diagnosis Date   Abscess 04/2017   Scrotal   Anemia    Arteriosclerotic cardiovascular disease (ASCVD)    MI-2000s; stent to the proximal LAD and diagonal in 2001; stress nuclear in 2008-impaired exercise capacity, left ventricular dilatation, moderately to severely depressed EF, apical, inferior and anteroseptal scar   Arthritis    Atrial flutter (HCC)    Bence-Jones proteinuria 05/05/2011   Cellulitis of leg    both legs   Chronic diarrhea    Chronic kidney disease, stage 3, mod decreased GFR (HCC)    Creatinine of 1.84 in 06/2011 and  1.5 in 07/2011   Diabetes mellitus    Insulin   Dysrhythmia    AFlutter   GERD (gastroesophageal reflux disease)    Gout    Hyperlipidemia    Hypertension    Injection site reaction    Multiple myeloma 07/01/2011   Myocardial infarction ([facility]000   Obesity    Pedal edema    Venous insufficiency   Sleep apnea    uses cpap   Ulcer      Allergies  Allergen Reactions   Beef-Derived Products Diarrhea   Pork-Derived Products Diarrhea   Other     Band aids      Current Outpatient Medications  Medication Sig Dispense Refill   acetaminophen (TYLENOL) 500 MG tablet Take 1,000 mg by mouth every 6 (six) hours as needed.     acyclovir (ZOVIRAX) 400 MG tablet Take 1 tablet (400 mg total) by mouth every morning. 30 tablet 5   allopurinol (ZYLOPRIM) 300 MG tablet TAKE ONE TABLET BY MOUTH ONCE DAILY. 30 tablet 2   atorvastatin (LIPITOR) 80 MG tablet TAKE ONE TABLET BY MOUTH AT BEDTIME. 90 tablet 3   Blood Glucose Monitoring Suppl (ACCU-CHEK GUIDE) w/Device KIT USE TO CHECK BLOOD  SUGARSITWICE DAILY.     calcitRIOL (ROCALTROL) 0.25 MCG capsule Take 0.25 mcg by mouth 3 (three) times a week. Monday, Wednesday and Friday     Cholecalciferol (D2000 ULTRA STRENGTH) 50 MCG (2000 UT) CAPS Take by mouth.     Ciclopirox 0.77 % gel SMARTSIG:Sparingly Topical Twice Daily     CONTOUR NEXT TEST test strip      CREON 36000-114000 units CPEP capsule Take 36,000 Units by mouth 3 (three) times daily as needed.     dicyclomine (BENTYL) 20 MG tablet TAKE 1 TABLET BY MOUTH BEFORE MEALS 3 TIMES DAILY. 90 tablet 2   Eluxadoline 100 MG TABS Take 100 mg by mouth 2 (two) times daily with a meal.     ferrous sulfate 325 (65 FE) MG EC tablet Take 45 mg by mouth. Every other day     gabapentin (NEURONTIN) 600 MG tablet TAKE 1 TABLET BY MOUTH ONCE A DAY. 30 tablet 0   hydrALAZINE (APRESOLINE) 100 MG tablet Take 1 tablet (100 mg total) by mouth 3 (three) times daily. 270 tablet 3   insulin aspart (NOVOLOG) 100 UNIT/ML  injection Inject 1-3 Units into the skin 3 (three) times daily with meals. Sliding scale over 150= 1 unit, 200= 2 units,250= 3 units     insulin detemir (LEVEMIR) 100 UNIT/ML injection Inject 0.1 mLs (10 Units total) into the skin every morning. 10 mL 11   INSULIN SYRINGE .5CC/29G 29G X 1/2" 0.5 ML MISC      isosorbide mononitrate (IMDUR) 30 MG 24 hr tablet Take 1 tablet (30 mg total) by mouth daily. 90 tablet 3   loratadine (CLARITIN) 10 MG tablet Take 10 mg by mouth every morning.     Magnesium Cl-Calcium Carbonate (SLOW-MAG PO) Take 1 tablet by mouth every morning.     Microlet Lancets MISC      Multiple Vitamins-Minerals (MULTIVITAMINS THER. W/MINERALS) TABS Take 1 tablet by mouth daily.     nitroGLYCERIN (NITROSTAT) 0.4 MG SL tablet DISSOLVE 1 TABLET UNDER TONGUE EVERY 5 MINUTES UP TO 15 MIN FOR CHESTPAIN. IF NO RELIEF CALL 911. (Patient taking differently: Place 0.4 mg under the tongue every 5 (five) minutes as needed.) 25 tablet 0   pantoprazole (PROTONIX) 40 MG tablet Take 40 mg by mouth daily.     potassium chloride SA (KLOR-CON) 20 MEQ tablet Take 20 mEq by mouth See admin instructions. Take 40 meq in the morning and 20 meq every evening.     rivaroxaban (XARELTO) 15 MG TABS tablet Take 1 tablet (15 mg total) by mouth daily with supper. 30 tablet 1   sodium bicarbonate 650 MG tablet Take 650 mg by mouth 3 (three) times daily.     SURE COMFORT PEN NEEDLES 29G X 12.7MM MISC USE AS DIRECTED FOURCTIMES DAILY.     torsemide (DEMADEX) 20 MG tablet Take by mouth daily. Take 60 mg am,40 mg pm     No current facility-administered medications for this visit.   Facility-Administered Medications Ordered in Other Visits  Medication Dose Route Frequency Provider Last Rate Last Admin   sodium chloride 0.9 % injection 10 mL  10 mL Intravenous Once Farrel Gobble, MD         Past Surgical History:  Procedure Laterality Date   ABSCESS DRAINAGE     Scrotal   BIOPSY  01/02/2012   Procedure:  BIOPSY;  Surgeon: Rogene Houston, MD;  [facility];  Service: Endoscopy;  Laterality: N/A;   BONE MARROW BIOPSY  05/13/11   CARDIAC CATHETERIZATION     cardiac stent   CARDIOVERSION N/A 10/13/2012   Procedure: CARDIOVERSION;  Surgeon: Yehuda Savannah, MD;  Location: [facility];  Service: Cardiovascular;  Laterality: N/A;   CATARACT EXTRACTION W/PHACO Left 02/13/2014   Procedure: CATARACT EXTRACTION PHACO AND INTRAOCULAR LENS PLACEMENT ([place]);  Surgeon: Tonny , MD;  Location: [facility];  Service: Ophthalmology;  Laterality: Left;  CDE:  7.67   CATARACT EXTRACTION W/PHACO Right 03/02/2014   Procedure: CATARACT EXTRACTION PHACO AND INTRAOCULAR LENS PLACEMENT RIGHT EYE CDE=16.81;  Surgeon: Tonny , MD;  Location: [facility];  Service: Ophthalmology;  Laterality: Right;   COLONOSCOPY  11/28/2011   Procedure: COLONOSCOPY;  Surgeon: Rogene Houston, MD;  [facility];  Service: Endoscopy;  Laterality: N/A;  [place] Right 12/16/2017   Procedure: CYSTOSCOPY WITH RIGHT RETROGRADE PYELOGRAM AND RIGHT URETERAL STENT REMOVAL;  Surgeon: Cleon Gustin, MD;  Location: [facility];  Service: Urology;  Laterality: Right;   CYSTOSCOPY W/ URETERAL STENT PLACEMENT Right 07/31/2017   Procedure: CYSTOSCOPY WITH RETROGRADE PYELOGRAM/URETERAL STENT PLACEMENT;  Surgeon: Cleon Gustin, MD;  [facility];  Service: Urology;  Laterality: Right;   ESOPHAGOGASTRODUODENOSCOPY  01/02/2012   Procedure: ESOPHAGOGASTRODUODENOSCOPY (EGD);  Surgeon: Rogene Houston, MD;  [facility];  Service: Endoscopy;  Laterality: N/A;  100   ESOPHAGOGASTRODUODENOSCOPY N/A 09/20/2012   Procedure: ESOPHAGOGASTRODUODENOSCOPY (EGD);  Surgeon: Rogene Houston, MD;  [facility];  Service: Endoscopy;  Laterality: N/A;   EUS N/A 10/07/2012   Procedure: UPPER ENDOSCOPIC ULTRASOUND (EUS) LINEAR;  Surgeon: Milus Banister, MD;  [facility];  Service: Endoscopy;  Laterality: N/A;    INCISION AND DRAINAGE ABSCESS N/A 04/14/2017   Procedure: INCISION AND DRAINAGE ABSCESS;  Surgeon: Ceasar Mons, MD;  [facility];  Service: Urology;  Laterality: N/A;   INCISION AND DRAINAGE ABSCESS ANAL     IRRIGATION AND DEBRIDEMENT ABSCESS N/A 04/15/2017   Procedure: DEBRIDEMENT SCROTAL WOUND AND DRESSING CHANGE;  Surgeon: Ceasar Mons, MD;  [facility];  Service: Urology;  Laterality: N/A;   IRRIGATION AND DEBRIDEMENT ABSCESS N/A 04/17/2017   Procedure: IRRIGATION AND DEBRIDEMENT ABSCESS;  Surgeon: Ceasar Mons, MD;  [facility];  Service: Urology;  Laterality: N/A;  RM 3   LAPAROSCOPIC GASTRIC BANDING  2006   has been removed   OSTECTOMY Right 04/08/2017   Procedure: OSTECTOMY RIGHT GREAT TOE;  Surgeon: Caprice Beaver, DPM;  Location: [facility];  Service: Podiatry;  Laterality: Right;   PORT-A-CATH REMOVAL Left 12/07/2012   Procedure: REMOVAL PORT-A-CATH;  Surgeon: Scherry Ran, MD;  [facility];  Service: General;  Laterality: Left;   PORT-A-CATH REMOVAL N/A 10/02/2017   Procedure: MINOR REMOVAL PORT-A-CATH AT BEDSIDE;  Surgeon: Donnie Mesa, MD;  Location: [facility];  Service: General;  Laterality: N/A;   PORTACATH PLACEMENT  07/07/2011   Procedure: INSERTION PORT-A-CATH;  Surgeon: Scherry Ran, MD;  [facility];  Service: General;  Laterality: N/A;   PORTACATH PLACEMENT N/A 12/07/2012   Procedure: INSERTION PORT-A-CATH;  Surgeon: Scherry Ran, MD;  [facility];  Service: General;  Laterality: N/A;  Attempted portacath placement on left and right side   URETEROSCOPY Right 12/16/2017   Procedure: DIAGNOSTIC RIGHT URETEROSCOPY;  Surgeon: Cleon Gustin, MD;  [facility];  Service: Urology;  Laterality: Right;   WOUND DEBRIDEMENT Right 04/08/2017   Procedure: EXCISION ULCERATION RIGHT GREAT TOE;  Surgeon:  Caprice Beaver, DPM;  Location: [facility];  Service: Podiatry;  Laterality: Right;   WRIST  SURGERY     Left; removal of bone fragment     Allergies  Allergen Reactions   Beef-Derived Products Diarrhea   Pork-Derived Products Diarrhea   Other     Band aids       Family History  Problem Relation Age of Onset   Heart disease Mother    Cancer Mother    Diabetes Father    Arthritis Other    Anesthesia problems Neg Hx    Hypotension Neg Hx    Malignant hyperthermia Neg Hx    Pseudochol deficiency Neg Hx      Social History Mr. Vaccarella reports that he quit smoking about 20 years ago. His smoking use included cigarettes and cigars. He has a 0.25 pack-year smoking history. He has never used smokeless tobacco. Mr. Servantes reports no history of alcohol use.   Review of Systems CONSTITUTIONAL: No weight loss, fever, chills, weakness or fatigue.  HEENT: Eyes: No visual loss, blurred vision, double vision or yellow sclerae.No hearing loss, sneezing, congestion, runny nose or sore throat.  SKIN: No rash or itching.  CARDIOVASCULAR: per hpi RESPIRATORY: No shortness of breath, cough or sputum.  GASTROINTESTINAL: No anorexia, nausea, vomiting or diarrhea. No abdominal pain or blood.  GENITOURINARY: No burning on urination, no polyuria NEUROLOGICAL: No headache, dizziness, syncope, paralysis, ataxia, numbness or tingling in the extremities. No change in bowel or bladder control.  MUSCULOSKELETAL: No muscle, back pain, joint pain or stiffness.  LYMPHATICS: No enlarged nodes. No history of splenectomy.  PSYCHIATRIC: No history of depression or anxiety.  ENDOCRINOLOGIC: No reports of sweating, cold or heat intolerance. No polyuria or polydipsia.  Marland Kitchen   Physical Examination Today's Vitals   10/17/21 0954  BP: 138/82  Pulse: 63  SpO2: 97%  Weight: (!) 360 lb 3.2 oz (163.4 kg)  Height: '6\' 2"'  (1.88 m)   Body mass index is 46.25 kg/m.  Gen: resting comfortably, no acute distress HEENT: no scleral icterus, pupils equal round and reactive, no palptable cervical  adenopathy,  CV: irreg, no m/r/g no jvd Resp: Clear to auscultation bilaterally GI: abdomen is soft, non-tender, non-distended, normal bowel sounds, no hepatosplenomegaly MSK: extremities are warm, no edema.  Skin: warm, no rash Neuro:  no focal deficits Psych: appropriate affect   Diagnostic Studies  10/2020 echo IMPRESSIONS     1. Left ventricular ejection fraction, by estimation, is approximately 30  to 35%. The left ventricle has moderately decreased function. The left  ventricle demonstrates regional wall motion abnormalities (see scoring  diagram/findings for description).  The left ventricular internal cavity size was moderately dilated. There is  mild left ventricular hypertrophy. Left ventricular diastolic parameters  are consistent with Grade II diastolic dysfunction (pseudonormalization).   2. Right ventricular systolic function is normal. The right ventricular  size is normal. Tricuspid regurgitation signal is inadequate for assessing  PA pressure.   3. Left atrial size was mildly dilated.   4. Right atrial size was mildly dilated.   5. The mitral valve is grossly normal. Mild mitral valve regurgitation.   6. The aortic valve is tricuspid. Aortic valve regurgitation is not  visualized. There is a rounded echodensity seen at the aortic valve  coaptation point in the parasternal long axis view only. Although cannot  exclude vegetation, this could be visualized   calcified left coronary leaflet tip based on other views. If endocarditis  is suspected clinically, would  suggest TEE.   7. The inferior vena cava is dilated in size with >50% respiratory  variability, suggesting right atrial pressure of 8 mmHg.    Assessment and Plan   1. Chronic systolic HF - several year history of chronic systolic HF, ICM - medical therapy limited. Bradycardia on beta blockers now off. Significant renal dysfunction limits ACE/ARB/ARNI consideration. Renal did recently add aldactone. .  Not a candidate for farxiga/jardiance given GFR <20     2. Aflutter - not on av nodal agents due to prior bradycardia -CrCl right at 50, continue xarelto 19m daily.      3. HTN - home numbers at goal, continue current meds     4.CAD - no symptoms, continue current meds  F/u 4 months  JArnoldo Lenis M.D.

## 2021-10-31 ENCOUNTER — Ambulatory Visit (HOSPITAL_COMMUNITY): Payer: BC Managed Care – PPO

## 2021-10-31 ENCOUNTER — Encounter (HOSPITAL_COMMUNITY)
Admission: RE | Admit: 2021-10-31 | Discharge: 2021-10-31 | Disposition: A | Discharge status: Home / Self Care | Payer: BC Managed Care – PPO | Source: Ambulatory Visit | Attending: Nephrology | Admitting: Nephrology

## 2021-10-31 VITALS — BP 113/58 | HR 51 | Temp 98.7°F | Resp 16

## 2021-10-31 DIAGNOSIS — D631 Anemia in chronic kidney disease: Secondary | ICD-10-CM | POA: Diagnosis not present

## 2021-10-31 DIAGNOSIS — N1832 Chronic kidney disease, stage 3b: Secondary | ICD-10-CM

## 2021-10-31 DIAGNOSIS — N184 Chronic kidney disease, stage 4 (severe): Secondary | ICD-10-CM | POA: Insufficient documentation

## 2021-10-31 LAB — POCT HEMOGLOBIN-HEMACUE: Hemoglobin: 11.5 g/dL — ABNORMAL LOW (ref 13.0–17.0)

## 2021-10-31 MED ORDER — EPOETIN ALFA-EPBX 10000 UNIT/ML IJ SOLN: 10000.0000 [IU] | Freq: Once | INTRAMUSCULAR | Status: DC

## 2021-11-14 ENCOUNTER — Encounter (HOSPITAL_COMMUNITY)
Admission: RE | Admit: 2021-11-14 | Discharge: 2021-11-14 | Disposition: A | Discharge status: Home / Self Care | Payer: BC Managed Care – PPO | Source: Ambulatory Visit | Attending: Nephrology | Admitting: Nephrology

## 2021-11-14 DIAGNOSIS — N1832 Chronic kidney disease, stage 3b: Secondary | ICD-10-CM

## 2021-11-28 ENCOUNTER — Encounter (HOSPITAL_COMMUNITY)
Admission: RE | Admit: 2021-11-28 | Discharge: 2021-11-28 | Disposition: A | Discharge status: Home / Self Care | Payer: BC Managed Care – PPO | Source: Ambulatory Visit | Attending: Nephrology | Admitting: Nephrology

## 2021-11-28 ENCOUNTER — Encounter (HOSPITAL_COMMUNITY): Payer: Self-pay

## 2021-11-28 DIAGNOSIS — N1832 Chronic kidney disease, stage 3b: Secondary | ICD-10-CM

## 2021-11-28 DIAGNOSIS — N184 Chronic kidney disease, stage 4 (severe): Secondary | ICD-10-CM | POA: Diagnosis not present

## 2021-11-28 LAB — POCT HEMOGLOBIN-HEMACUE: Hemoglobin: 10.8 g/dL — ABNORMAL LOW (ref 13.0–17.0)

## 2021-11-28 MED ORDER — EPOETIN ALFA-EPBX 40000 UNIT/ML IJ SOLN: 10000.0000 [IU] | Freq: Once | INTRAMUSCULAR | Status: DC

## 2021-12-10 ENCOUNTER — Other Ambulatory Visit (HOSPITAL_COMMUNITY): Payer: Self-pay | Admitting: *Deleted

## 2021-12-10 DIAGNOSIS — D649 Anemia, unspecified: Secondary | ICD-10-CM

## 2021-12-10 DIAGNOSIS — C9001 Multiple myeloma in remission: Secondary | ICD-10-CM

## 2021-12-12 ENCOUNTER — Encounter (HOSPITAL_COMMUNITY)
Admission: RE | Admit: 2021-12-12 | Discharge: 2021-12-12 | Disposition: A | Discharge status: Home / Self Care | Payer: BC Managed Care – PPO | Source: Ambulatory Visit | Attending: Nephrology | Admitting: Nephrology

## 2021-12-12 ENCOUNTER — Ambulatory Visit (HOSPITAL_COMMUNITY): Payer: BC Managed Care – PPO | Admitting: Hematology

## 2021-12-12 VITALS — BP 113/64 | HR 54 | Temp 98.5°F | Resp 18

## 2021-12-12 DIAGNOSIS — N1832 Chronic kidney disease, stage 3b: Secondary | ICD-10-CM | POA: Diagnosis not present

## 2021-12-12 DIAGNOSIS — D631 Anemia in chronic kidney disease: Secondary | ICD-10-CM | POA: Diagnosis not present

## 2021-12-12 LAB — POCT HEMOGLOBIN-HEMACUE: Hemoglobin: 11 g/dL — ABNORMAL LOW (ref 13.0–17.0)

## 2021-12-12 MED ORDER — EPOETIN ALFA-EPBX 10000 UNIT/ML IJ SOLN: 10000.0000 [IU] | Freq: Once | INTRAMUSCULAR | Status: DC

## 2021-12-24 ENCOUNTER — Ambulatory Visit (HOSPITAL_COMMUNITY): Payer: BC Managed Care – PPO | Admitting: Hematology

## 2021-12-26 ENCOUNTER — Encounter (HOSPITAL_COMMUNITY)
Admission: RE | Admit: 2021-12-26 | Discharge: 2021-12-26 | Disposition: A | Discharge status: Home / Self Care | Payer: BC Managed Care – PPO | Source: Ambulatory Visit | Attending: Nephrology | Admitting: Nephrology

## 2021-12-26 DIAGNOSIS — N1832 Chronic kidney disease, stage 3b: Secondary | ICD-10-CM

## 2022-01-02 ENCOUNTER — Inpatient Hospital Stay: Payer: BC Managed Care – PPO | Attending: Hematology | Admitting: Hematology

## 2022-01-02 VITALS — BP 138/71 | HR 46 | Temp 96.2°F | Resp 16

## 2022-01-02 DIAGNOSIS — Z809 Family history of malignant neoplasm, unspecified: Secondary | ICD-10-CM | POA: Insufficient documentation

## 2022-01-02 DIAGNOSIS — D509 Iron deficiency anemia, unspecified: Secondary | ICD-10-CM | POA: Insufficient documentation

## 2022-01-02 DIAGNOSIS — E1122 Type 2 diabetes mellitus with diabetic chronic kidney disease: Secondary | ICD-10-CM | POA: Diagnosis not present

## 2022-01-02 DIAGNOSIS — Z87891 Personal history of nicotine dependence: Secondary | ICD-10-CM | POA: Insufficient documentation

## 2022-01-02 DIAGNOSIS — N183 Chronic kidney disease, stage 3 unspecified: Secondary | ICD-10-CM | POA: Diagnosis not present

## 2022-01-02 DIAGNOSIS — I129 Hypertensive chronic kidney disease with stage 1 through stage 4 chronic kidney disease, or unspecified chronic kidney disease: Secondary | ICD-10-CM | POA: Insufficient documentation

## 2022-01-02 DIAGNOSIS — C9001 Multiple myeloma in remission: Secondary | ICD-10-CM | POA: Diagnosis not present

## 2022-01-02 DIAGNOSIS — D649 Anemia, unspecified: Secondary | ICD-10-CM

## 2022-01-02 DIAGNOSIS — D631 Anemia in chronic kidney disease: Secondary | ICD-10-CM | POA: Diagnosis not present

## 2022-01-02 DIAGNOSIS — D696 Thrombocytopenia, unspecified: Secondary | ICD-10-CM | POA: Insufficient documentation

## 2022-01-02 DIAGNOSIS — D539 Nutritional anemia, unspecified: Secondary | ICD-10-CM | POA: Diagnosis not present

## 2022-01-02 DIAGNOSIS — C9000 Multiple myeloma not having achieved remission: Secondary | ICD-10-CM | POA: Insufficient documentation

## 2022-01-02 NOTE — Progress Notes (Signed)
[place], [place] [address]   CLINIC:  Medical Oncology/Hematology  PCP:  [name], [address] / [place] [address]  [phone]  REASON FOR VISIT:  Follow-up for multiple myeloma  PRIOR THERAPY: none  CURRENT THERAPY: surveillance  INTERVAL HISTORY:  [name], a 61 y.o. male, returns for routine follow-up for his multiple myeloma. Janssen was last seen on 08/07/2021.  Today patient is feeling well. He was admitted to the ED on 08/10/2021 for CP due to cardiac issues. He continues following up with his nephrologist and regularly received iron infusions. He has not required retacrit. He continues taking 60 mg torsemide in the morning and 40 mg at night.     REVIEW OF SYSTEMS:  Review of Systems  Constitutional:  Negative for appetite change.  Gastrointestinal:  Positive for constipation and diarrhea.  Neurological:  Positive for numbness (feet).  All other systems reviewed and are negative.   PAST MEDICAL/SURGICAL HISTORY:  Past Medical History:  Diagnosis Date   Abscess 04/2017   Scrotal   Anemia    Arteriosclerotic cardiovascular disease (ASCVD)    MI-2000s; stent to the proximal LAD and diagonal in 2001; stress nuclear in 2008-impaired exercise capacity, left ventricular dilatation, moderately to severely depressed EF, apical, inferior and anteroseptal scar   Arthritis    Atrial flutter (HCC)    Bence-Jones proteinuria 05/05/2011   Cellulitis of leg    both legs   Chronic diarrhea    Chronic kidney disease, stage 3, mod decreased GFR (HCC)    Creatinine of 1.84 in 06/2011 and 1.5 in 07/2011   Diabetes mellitus    Insulin   Dysrhythmia    AFlutter   GERD (gastroesophageal reflux disease)    Gout    Hyperlipidemia    Hypertension    Injection site reaction    Multiple myeloma 07/01/2011   Myocardial infarction ([place]) 2000   Obesity    Pedal edema    Venous insufficiency   Sleep apnea     uses cpap   Ulcer    Past Surgical History:  Procedure Laterality Date   ABSCESS DRAINAGE     Scrotal   BIOPSY  01/02/2012   Procedure: BIOPSY;  Surgeon: Rogene Houston, MD;  [facility];  Service: Endoscopy;  Laterality: N/A;   BONE MARROW BIOPSY  05/13/11   CARDIAC CATHETERIZATION     cardiac stent   CARDIOVERSION N/A 10/13/2012   Procedure: CARDIOVERSION;  Surgeon: Yehuda Savannah, MD;  Location: [facility];  Service: Cardiovascular;  Laterality: N/A;   CATARACT EXTRACTION W/PHACO Left 02/13/2014   Procedure: CATARACT EXTRACTION PHACO AND INTRAOCULAR LENS PLACEMENT ([place]);  Surgeon: Tonny Branch, MD;  Location: [facility];  Service: Ophthalmology;  Laterality: Left;  CDE:  7.67   CATARACT EXTRACTION W/PHACO Right 03/02/2014   Procedure: CATARACT EXTRACTION PHACO AND INTRAOCULAR LENS PLACEMENT RIGHT EYE CDE=16.81;  Surgeon: Tonny Branch, MD;  Location: [facility];  Service: Ophthalmology;  Laterality: Right;   COLONOSCOPY  11/28/2011   Procedure: COLONOSCOPY;  Surgeon: Rogene Houston, MD;  [facility];  Service: Endoscopy;  Laterality: N/A;  [place] Right 12/16/2017   Procedure: CYSTOSCOPY WITH RIGHT RETROGRADE PYELOGRAM AND RIGHT URETERAL STENT REMOVAL;  Surgeon: Cleon Gustin, MD;  Location: [facility];  Service: Urology;  Laterality: Right;   CYSTOSCOPY W/ URETERAL STENT PLACEMENT Right 07/31/2017   Procedure: CYSTOSCOPY WITH RETROGRADE  PYELOGRAM/URETERAL STENT PLACEMENT;  Surgeon: Cleon Gustin, MD;  [facility];  Service: Urology;  Laterality: Right;   ESOPHAGOGASTRODUODENOSCOPY  01/02/2012   Procedure: ESOPHAGOGASTRODUODENOSCOPY (EGD);  Surgeon: Rogene Houston, MD;  [facility];  Service: Endoscopy;  Laterality: N/A;  100   ESOPHAGOGASTRODUODENOSCOPY N/A 09/20/2012   Procedure: ESOPHAGOGASTRODUODENOSCOPY (EGD);  Surgeon: Rogene Houston, MD;  [facility];  Service: Endoscopy;  Laterality: N/A;   EUS N/A 10/07/2012    Procedure: UPPER ENDOSCOPIC ULTRASOUND (EUS) LINEAR;  Surgeon: Milus Banister, MD;  [facility];  Service: Endoscopy;  Laterality: N/A;   INCISION AND DRAINAGE ABSCESS N/A 04/14/2017   Procedure: INCISION AND DRAINAGE ABSCESS;  Surgeon: Ceasar Mons, MD;  [facility];  Service: Urology;  Laterality: N/A;   INCISION AND DRAINAGE ABSCESS ANAL     IRRIGATION AND DEBRIDEMENT ABSCESS N/A 04/15/2017   Procedure: DEBRIDEMENT SCROTAL WOUND AND DRESSING CHANGE;  Surgeon: Ceasar Mons, MD;  [facility];  Service: Urology;  Laterality: N/A;   IRRIGATION AND DEBRIDEMENT ABSCESS N/A 04/17/2017   Procedure: IRRIGATION AND DEBRIDEMENT ABSCESS;  Surgeon: Ceasar Mons, MD;  [facility];  Service: Urology;  Laterality: N/A;  RM 3   LAPAROSCOPIC GASTRIC BANDING  2006   has been removed   OSTECTOMY Right 04/08/2017   Procedure: OSTECTOMY RIGHT GREAT TOE;  Surgeon: Caprice Beaver, DPM;  [facility];  Service: Podiatry;  Laterality: Right;   PORT-A-CATH REMOVAL Left 12/07/2012   Procedure: REMOVAL PORT-A-CATH;  Surgeon: Scherry Ran, MD;  [facility];  Service: General;  Laterality: Left;   PORT-A-CATH REMOVAL N/A 10/02/2017   Procedure: MINOR REMOVAL PORT-A-CATH AT BEDSIDE;  Surgeon: Donnie Mesa, MD;  Location: [facility];  Service: General;  Laterality: N/A;   PORTACATH PLACEMENT  07/07/2011   Procedure: INSERTION PORT-A-CATH;  Surgeon: Scherry Ran, MD;  [facility];  Service: General;  Laterality: N/A;   PORTACATH PLACEMENT N/A 12/07/2012   Procedure: INSERTION PORT-A-CATH;  Surgeon: Scherry Ran, MD;  [facility];  Service: General;  Laterality: N/A;  Attempted portacath placement on left and right side   URETEROSCOPY Right 12/16/2017   Procedure: DIAGNOSTIC RIGHT URETEROSCOPY;  Surgeon: Cleon Gustin, MD;  [facility];  Service: Urology;  Laterality: Right;   WOUND DEBRIDEMENT Right 04/08/2017    Procedure: EXCISION ULCERATION RIGHT GREAT TOE;  Surgeon: Caprice Beaver, DPM;  Location[facility];  Service: Podiatry;  Laterality: Right;   WRIST SURGERY     Left; removal of bone fragment    SOCIAL HISTORY:  Social History   Socioeconomic History   Marital status: Married    Spouse name: Not on file   Number of children: Not on file   Years of education: 16   Highest education level: Not on file  Occupational History   Occupation: Nurse, learning disability   Occupation: Optometrist: [employer]  Tobacco Use   Smoking status: Former    Packs/day: 0.25    Years: 1.00    Total pack years: 0.25    Types: Cigarettes, Cigars    Quit date: 05/17/2001    Years since quitting: 20.6   Smokeless tobacco: Never  Vaping Use   Vaping Use: Never used  Substance and Sexual Activity   Alcohol use: No    Alcohol/week: 0.0 standard drinks of alcohol   Drug use: No   Sexual activity: Yes    Birth control/protection: None  Other Topics Concern   Not on file  Social History Narrative   Not on file   Social Determinants of Health   Financial Resource Strain: Not on file  Food Insecurity: Not on file  Transportation Needs: Not on file  Physical Activity: Not on file  Stress: Not on file  Social Connections: Not on file  Intimate Partner Violence: Not on file    FAMILY HISTORY:  Family History  Problem Relation Age of Onset   Heart disease Mother    Cancer Mother    Diabetes Father    Arthritis Other    Anesthesia problems Neg Hx    Hypotension Neg Hx    Malignant hyperthermia Neg Hx    Pseudochol deficiency Neg Hx     CURRENT MEDICATIONS:  Current Outpatient Medications  Medication Sig Dispense Refill   acetaminophen (TYLENOL) 500 MG tablet Take 1,000 mg by mouth every 6 (six) hours as needed.     acyclovir (ZOVIRAX) 400 MG tablet Take 1 tablet (400 mg total) by mouth every morning. 30 tablet 5   allopurinol (ZYLOPRIM) 300 MG tablet TAKE ONE TABLET  BY MOUTH ONCE DAILY. 30 tablet 2   atorvastatin (LIPITOR) 80 MG tablet TAKE ONE TABLET BY MOUTH AT BEDTIME. 90 tablet 3   Blood Glucose Monitoring Suppl (ACCU-CHEK GUIDE) w/Device KIT USE TO CHECK BLOOD SUGARSITWICE DAILY.     calcitRIOL (ROCALTROL) 0.25 MCG capsule Take 0.25 mcg by mouth 3 (three) times a week. Monday, Wednesday and Friday     Cholecalciferol (D2000 ULTRA STRENGTH) 50 MCG (2000 UT) CAPS Take by mouth.     Ciclopirox 0.77 % gel SMARTSIG:Sparingly Topical Twice Daily     CONTOUR NEXT TEST test strip      CREON 36000-114000 units CPEP capsule Take 36,000 Units by mouth 3 (three) times daily as needed.     dicyclomine (BENTYL) 20 MG tablet TAKE 1 TABLET BY MOUTH BEFORE MEALS 3 TIMES DAILY. 90 tablet 2   Eluxadoline 100 MG TABS Take 100 mg by mouth 2 (two) times daily with a meal.     ferrous sulfate 325 (65 FE) MG EC tablet Take 45 mg by mouth. Every other day     gabapentin (NEURONTIN) 600 MG tablet TAKE 1 TABLET BY MOUTH ONCE A DAY. 30 tablet 0   hydrALAZINE (APRESOLINE) 100 MG tablet Take 1 tablet (100 mg total) by mouth 3 (three) times daily. 270 tablet 3   insulin aspart (NOVOLOG) 100 UNIT/ML injection Inject 1-3 Units into the skin 3 (three) times daily with meals. Sliding scale over 150= 1 unit, 200= 2 units,250= 3 units     insulin detemir (LEVEMIR) 100 UNIT/ML injection Inject 0.1 mLs (10 Units total) into the skin every morning. 10 mL 11   INSULIN SYRINGE .5CC/29G 29G X 1/2" 0.5 ML MISC      loratadine (CLARITIN) 10 MG tablet Take 10 mg by mouth every morning.     Magnesium Cl-Calcium Carbonate (SLOW-MAG PO) Take 1 tablet by mouth every morning.     Microlet Lancets MISC      Multiple Vitamins-Minerals (MULTIVITAMINS THER. W/MINERALS) TABS Take 1 tablet by mouth daily.     pantoprazole (PROTONIX) 40 MG tablet Take 40 mg by mouth daily.     potassium chloride SA (KLOR-CON) 20 MEQ tablet Take 20 mEq by mouth See admin instructions. Take 40 meq in the morning and 20 meq  every evening.     sodium bicarbonate 650 MG tablet Take 650 mg by mouth  3 (three) times daily.     spironolactone (ALDACTONE) 25 MG tablet Take 25 mg by mouth daily.     SURE COMFORT PEN NEEDLES 29G X 12.7MM MISC USE AS DIRECTED FOURCTIMES DAILY.     torsemide (DEMADEX) 20 MG tablet Take by mouth daily. Take 60 mg am,40 mg pm     XARELTO 20 MG TABS tablet SMARTSIG:1 Tablet(s) By Mouth Every Evening     isosorbide mononitrate (IMDUR) 30 MG 24 hr tablet Take 1 tablet (30 mg total) by mouth daily. 90 tablet 3   nitroGLYCERIN (NITROSTAT) 0.4 MG SL tablet DISSOLVE 1 TABLET UNDER TONGUE EVERY 5 MINUTES UP TO 15 MIN FOR CHESTPAIN. IF NO RELIEF CALL 911. (Patient not taking: Reported on 01/02/2022) 25 tablet 0   No current facility-administered medications for this visit.   Facility-Administered Medications Ordered in Other Visits  Medication Dose Route Frequency Provider Last Rate Last Admin   sodium chloride 0.9 % injection 10 mL  10 mL Intravenous Once Farrel Gobble, MD        ALLERGIES:  Allergies  Allergen Reactions   Beef-Derived Products Diarrhea   Pork-Derived Products Diarrhea   Other     Band aids     PHYSICAL EXAM:  Performance status (ECOG): 1 - Symptomatic but completely ambulatory  Vitals:   01/02/22 1058  BP: 138/71  Pulse: (!) 46  Resp: 16  Temp: (!) 96.2 F (35.7 C)  SpO2: 99%   Wt Readings from Last 3 Encounters:  11/28/21 (!) 360 lb 3.7 oz (163.4 kg)  10/17/21 (!) 360 lb 3.2 oz (163.4 kg)  10/03/21 (!) 361 lb (163.7 kg)   Physical Exam Vitals reviewed.  Constitutional:      Appearance: Normal appearance.     Comments: Patient is in wheelchair  Cardiovascular:     Rate and Rhythm: Normal rate and regular rhythm.     Pulses: Normal pulses.     Heart sounds: Normal heart sounds.  Pulmonary:     Effort: Pulmonary effort is normal.     Breath sounds: Normal breath sounds.  Neurological:     General: No focal deficit present.     Mental Status: He is  alert and oriented to person, place, and time.  Psychiatric:        Mood and Affect: Mood normal.        Behavior: Behavior normal.     LABORATORY DATA:  I have reviewed the labs as listed.     Latest Ref Rng & Units 12/12/2021    3:27 PM 11/28/2021    4:29 PM 10/31/2021    4:03 PM  CBC  Hemoglobin 13.0 - 17.0 g/dL 11.0  10.8  11.5       Latest Ref Rng & Units 08/15/2021    5:37 AM 08/14/2021    5:14 AM 08/13/2021    4:47 AM  CMP  Glucose 70 - 99 mg/dL 120  123  142   BUN 8 - 23 mg/dL 81  84  88   Creatinine 0.61 - 1.24 mg/dL 3.53  3.59  3.75   Sodium 135 - 145 mmol/L 145  143  142   Potassium 3.5 - 5.1 mmol/L 4.1  4.1  4.3   Chloride 98 - 111 mmol/L 109  108  108   CO2 22 - 32 mmol/L _0 Calcium 8.9 - 10.3 mg/dL 8.7  8.3  8.3       Component Value Date/Time   RBC  2.98 (L) 08/14/2021 0514   MCV 101.7 (H) 08/14/2021 0514   MCH 30.5 08/14/2021 0514   MCHC 30.0 08/14/2021 0514   RDW 15.1 08/14/2021 0514   LYMPHSABS 2.3 12/13/2019 1434   MONOABS 0.6 12/13/2019 1434   EOSABS 0.2 12/13/2019 1434   BASOSABS 0.0 12/13/2019 1434    DIAGNOSTIC IMAGING:  I have independently reviewed the scans and discussed with the patient. No results found.   ASSESSMENT:  1.  Multiple myeloma: -Immunofixation has been negative since 2017.   2.  Macrocytic anemia: -He has mild anemia from CKD and relative iron deficiency. -Last Feraheme was on 12/14/2017. -He is taking iron tablet every other day.   PLAN:  1.  Multiple myeloma: - He does not report any new onset bone pains.  No new infections.  Feet numbness has been stable. - Reviewed labs from [facility] (12/25/2021): Hb-11.4, PLT-82, creatinine-4.27, calcium-9.4, ALK-127, M spike-not observed, free light chain ratio 1.64, kappa light chains 97.5, lambda light chains 59.3.  Immunofixation shows polyclonal increase.  LDH was 231.  Ferritin 179 and percent saturation 28. - He does not have any "crab" features. - RTC 6 months with  repeat myeloma labs.   2.  Normocytic anemia: - Continue follow-up with [name] for intermittent Retacrit and Venofer.  Latest CBC shows hemoglobin is 11.4.  3.  Mild to moderate thrombocytopenia: - Platelet count on 12/25/2021 was 82 K.  However he has platelet aggregates on the sample.  Hence it is pseudothrombocytopenia. - If the platelet count continues to be low without clumping, I will do further testing.  Orders placed this encounter:  No orders of the defined types were placed in this encounter.    Derek Jack, MD [place] [phone]   I, Thana Ates, am acting as a scribe for Dr. Derek Jack.  I, Derek Jack MD, have reviewed the above documentation for accuracy and completeness, and I agree with the above.

## 2022-01-02 NOTE — Patient Instructions (Signed)
[place] at [facility] Discharge Instructions   You were seen and examined today by [name].  He reviewed the results of your lab work which are normal/stable.  We will see you back in 6 months - we will repeat lab work prior to your next visit.    Thank you for choosing [place] at [facility] to provide your oncology and hematology care.  To afford each patient quality time with our provider, please arrive at least 15 minutes before your scheduled appointment time.   If you have a lab appointment with the [facility] please come in thru the Main Entrance and check in at the main information desk.  You need to re-schedule your appointment should you arrive 10 or more minutes late.  We strive to give you quality time with our providers, and arriving late affects you and other patients whose appointments are after yours.  Also, if you no show three or more times for appointments you may be dismissed from the clinic at the providers discretion.     Again, thank you for choosing [facility].  Our hope is that these requests will decrease the amount of time that you wait before being seen by our physicians.       _____________________________________________________________  Should you have questions after your visit to [facility], please contact our office at [phone] and follow the prompts.  Our office hours are 8:00 a.m. and 4:30 p.m. Monday - Friday.  Please note that voicemails left after 4:00 p.m. may not be returned until the following business day.  We are closed weekends and major holidays.  You do have access to a nurse 24-7, just call the main number to the clinic [phone] and do not press any options, hold on the line and a nurse will answer the phone.    For prescription refill requests, have your pharmacy contact our office and allow 72 hours.    Due to Covid, you will need to wear a mask  upon entering the hospital. If you do not have a mask, a mask will be given to you at the Main Entrance upon arrival. For doctor visits, patients may have 1 support person age 92 or older with them. For treatment visits, patients can not have anyone with them due to social distancing guidelines and our immunocompromised population.

## 2022-01-09 ENCOUNTER — Other Ambulatory Visit (HOSPITAL_COMMUNITY): Payer: Self-pay | Admitting: *Deleted

## 2022-01-09 ENCOUNTER — Encounter (HOSPITAL_COMMUNITY)
Admission: RE | Admit: 2022-01-09 | Discharge: 2022-01-09 | Disposition: A | Discharge status: Home / Self Care | Payer: BC Managed Care – PPO | Source: Ambulatory Visit | Attending: Nephrology | Admitting: Nephrology

## 2022-01-09 DIAGNOSIS — N1832 Chronic kidney disease, stage 3b: Secondary | ICD-10-CM | POA: Diagnosis present

## 2022-01-09 DIAGNOSIS — C9000 Multiple myeloma not having achieved remission: Secondary | ICD-10-CM

## 2022-01-09 LAB — POCT HEMOGLOBIN-HEMACUE: Hemoglobin: 10.6 g/dL — ABNORMAL LOW (ref 13.0–17.0)

## 2022-01-15 ENCOUNTER — Encounter: Payer: Self-pay | Admitting: Vascular Surgery

## 2022-01-15 ENCOUNTER — Other Ambulatory Visit: Payer: Self-pay

## 2022-01-15 ENCOUNTER — Ambulatory Visit: Payer: BC Managed Care – PPO | Admitting: Vascular Surgery

## 2022-01-15 VITALS — BP 133/75 | HR 52 | Temp 97.9°F | Resp 14 | Ht 74.0 in | Wt 362.8 lb

## 2022-01-15 DIAGNOSIS — N184 Chronic kidney disease, stage 4 (severe): Secondary | ICD-10-CM

## 2022-01-15 NOTE — H&P (View-Only) (Signed)
  [facility] [place]  Patient name: [name] MRN: [id number] DOB: 10/06/1960 Sex: male  REASON FOR CONSULT: Discuss access for hemodialysis  Nephrologist: Bhutani  HPI: [name] is a 61 y.o. male, who is here today for discussion of access for hemodialysis.  He is here today with his wife he has progressive renal insufficiency related to hypertension and diabetes.  Fattening currently is in the 4 range.  Is not been on hemodialysis.  He is right-handed.  He does not have a pacemaker.  He is on chronic Eliquis related to atrial flutter.  Does have prior history of stenting for coronary artery disease.  2018 had Fournier's gangrene with prolonged series of debridement and antibiotic treatment.  He had a PICC line in his right basilic vein.  No PICC lines in the left.  Past Medical History:  Diagnosis Date   Abscess 04/2017   Scrotal   Anemia    Arteriosclerotic cardiovascular disease (ASCVD)    MI-2000s; stent to the proximal LAD and diagonal in 2001; stress nuclear in 2008-impaired exercise capacity, left ventricular dilatation, moderately to severely depressed EF, apical, inferior and anteroseptal scar   Arthritis    Atrial flutter (HCC)    Bence-Jones proteinuria 05/05/2011   Cellulitis of leg    both legs   Chronic diarrhea    Chronic kidney disease, stage 3, mod decreased GFR (HCC)    Creatinine of 1.84 in 06/2011 and 1.5 in 07/2011   Diabetes mellitus    Insulin   Dysrhythmia    AFlutter   GERD (gastroesophageal reflux disease)    Gout    Hyperlipidemia    Hypertension    Injection site reaction    Multiple myeloma 07/01/2011   Myocardial infarction (HCC) 2000   Obesity    Pedal edema    Venous insufficiency   Sleep apnea    uses cpap   Ulcer     Family History  Problem Relation Age of Onset   Heart disease Mother    Cancer Mother    Diabetes Father    Arthritis Other    Anesthesia problems  Neg Hx    Hypotension Neg Hx    Malignant hyperthermia Neg Hx    Pseudochol deficiency Neg Hx     SOCIAL HISTORY: Social History   Socioeconomic History   Marital status: Married    Spouse name: Not on file   Number of children: Not on file   Years of education: 16   Highest education level: Not on file  Occupational History   Occupation: Funeral Director   Occupation: Funeral Director    Employer: [employer]  Tobacco Use   Smoking status: Former    Packs/day: 0.25    Years: 1.00    Total pack years: 0.25    Types: Cigarettes, Cigars    Quit date: 05/17/2001    Years since quitting: 20.6   Smokeless tobacco: Never  Vaping Use   Vaping Use: Never used  Substance and Sexual Activity   Alcohol use: No    Alcohol/week: 0.0 standard drinks of alcohol   Drug use: No   Sexual activity: Yes    Birth control/protection: None  Other Topics Concern   Not on file  Social History Narrative   Not on file   Social Determinants of Health   Financial Resource Strain: Not on file  Food Insecurity: Not on file  Transportation Needs: Not on file  Physical Activity: Not on   file  Stress: Not on file  Social Connections: Not on file  Intimate Partner Violence: Not on file    Allergies  Allergen Reactions   Beef-Derived Products Diarrhea   Pork-Derived Products Diarrhea   Other     Band aids     Current Outpatient Medications  Medication Sig Dispense Refill   acetaminophen (TYLENOL) 500 MG tablet Take 1,000 mg by mouth every 6 (six) hours as needed.     acyclovir (ZOVIRAX) 400 MG tablet Take 1 tablet (400 mg total) by mouth every morning. 30 tablet 5   allopurinol (ZYLOPRIM) 300 MG tablet TAKE ONE TABLET BY MOUTH ONCE DAILY. 30 tablet 2   atorvastatin (LIPITOR) 80 MG tablet TAKE ONE TABLET BY MOUTH AT BEDTIME. 90 tablet 3   Blood Glucose Monitoring Suppl (ACCU-CHEK GUIDE) w/Device KIT USE TO CHECK BLOOD SUGARSITWICE DAILY.     calcitRIOL (ROCALTROL) 0.25 MCG  capsule Take 0.25 mcg by mouth 3 (three) times a week. Monday, Wednesday and Friday     Cholecalciferol (D2000 ULTRA STRENGTH) 50 MCG (2000 UT) CAPS Take by mouth.     Ciclopirox 0.77 % gel SMARTSIG:Sparingly Topical Twice Daily     CONTOUR NEXT TEST test strip      CREON 36000-114000 units CPEP capsule Take 36,000 Units by mouth 3 (three) times daily as needed.     dicyclomine (BENTYL) 20 MG tablet TAKE 1 TABLET BY MOUTH BEFORE MEALS 3 TIMES DAILY. 90 tablet 2   Eluxadoline 100 MG TABS Take 100 mg by mouth 2 (two) times daily with a meal.     ferrous sulfate 325 (65 FE) MG EC tablet Take 45 mg by mouth. Every other day     gabapentin (NEURONTIN) 600 MG tablet TAKE 1 TABLET BY MOUTH ONCE A DAY. 30 tablet 0   hydrALAZINE (APRESOLINE) 100 MG tablet Take 1 tablet (100 mg total) by mouth 3 (three) times daily. 270 tablet 3   insulin aspart (NOVOLOG) 100 UNIT/ML injection Inject 1-3 Units into the skin 3 (three) times daily with meals. Sliding scale over 150= 1 unit, 200= 2 units,250= 3 units     insulin detemir (LEVEMIR) 100 UNIT/ML injection Inject 0.1 mLs (10 Units total) into the skin every morning. 10 mL 11   INSULIN SYRINGE .5CC/29G 29G X 1/2" 0.5 ML MISC      loratadine (CLARITIN) 10 MG tablet Take 10 mg by mouth every morning.     Magnesium Cl-Calcium Carbonate (SLOW-MAG PO) Take 1 tablet by mouth every morning.     Microlet Lancets MISC      Multiple Vitamins-Minerals (MULTIVITAMINS THER. W/MINERALS) TABS Take 1 tablet by mouth daily.     nitroGLYCERIN (NITROSTAT) 0.4 MG SL tablet DISSOLVE 1 TABLET UNDER TONGUE EVERY 5 MINUTES UP TO 15 MIN FOR CHESTPAIN. IF NO RELIEF CALL 911. 25 tablet 0   pantoprazole (PROTONIX) 40 MG tablet Take 40 mg by mouth daily.     potassium chloride SA (KLOR-CON) 20 MEQ tablet Take 20 mEq by mouth See admin instructions. Take 40 meq in the morning and 20 meq every evening.     spironolactone (ALDACTONE) 25 MG tablet Take 25 mg by mouth daily.     SURE COMFORT PEN  NEEDLES 29G X 12.7MM MISC USE AS DIRECTED FOURCTIMES DAILY.     torsemide (DEMADEX) 20 MG tablet Take by mouth daily. Take 60 mg am,40 mg pm     XARELTO 20 MG TABS tablet SMARTSIG:1 Tablet(s) By Mouth Every Evening  isosorbide mononitrate (IMDUR) 30 MG 24 hr tablet Take 1 tablet (30 mg total) by mouth daily. 90 tablet 3   sodium bicarbonate 650 MG tablet Take 650 mg by mouth 3 (three) times daily. (Patient not taking: Reported on 01/15/2022)     No current facility-administered medications for this visit.   Facility-Administered Medications Ordered in Other Visits  Medication Dose Route Frequency Provider Last Rate Last Admin   sodium chloride 0.9 % injection 10 mL  10 mL Intravenous Once Farrel Gobble, MD        REVIEW OF SYSTEMS:  [X] denotes positive finding, [ ] denotes negative finding Cardiac  Comments:  Chest pain or chest pressure:    Shortness of breath upon exertion:    Short of breath when lying flat:    Irregular heart rhythm:        Vascular    Pain in calf, thigh, or hip brought on by ambulation:    Pain in feet at night that wakes you up from your sleep:     Blood clot in your veins:    Leg swelling:         Pulmonary    Oxygen at home:    Productive cough:     Wheezing:         Neurologic    Sudden weakness in arms or legs:     Sudden numbness in arms or legs:     Sudden onset of difficulty speaking or slurred speech:    Temporary loss of vision in one eye:     Problems with dizziness:         Gastrointestinal    Blood in stool:     Vomited blood:         Genitourinary    Burning when urinating:     Blood in urine:        Psychiatric    Major depression:         Hematologic    Bleeding problems:    Problems with blood clotting too easily:        Skin    Rashes or ulcers:        Constitutional    Fever or chills:      PHYSICAL EXAM: Vitals:   01/15/22 0911  BP: 133/75  Pulse: (!) 52  Resp: 14  Temp: 97.9 F (36.6 C)  TempSrc:  Temporal  SpO2: 96%  Weight: (!) 362 lb 12.8 oz (164.6 kg)  Height: 6' 2" (1.88 m)    GENERAL: The patient is a well-nourished male, in no acute distress. The vital signs are documented above. CARDIOVASCULAR: 2+ radial pulses bilaterally.  Moderate to large size cephalic vein at the wrist and forearm bilaterally. PULMONARY: There is good air exchange  MUSCULOSKELETAL: There are no major deformities or cyanosis. NEUROLOGIC: No focal weakness or paresthesias are detected. SKIN: There are no ulcers or rashes noted. PSYCHIATRIC: The patient has a normal affect.  DATA:  I imaged both arms with SonoSite ultrasound.  This does show a moderate cephalic vein in the forearm bilaterally.  This does extend the left arm to the upper arm cephalic vein.  He appears to have a small basilic vein in the left upper arm  MEDICAL ISSUES: Had a long discussion with the patient and his wife regarding options for typically hemodialysis back oxygen as desirable.  After discussion with the patient and his wife wish to proceed with fistula creation.  Please schedule this for him at the hospital  on 01/22/2019.  I discussed tunneled dialysis catheter for urgent dialysis and also AV graft and AV fistula.  I discussed the advantages and disadvantages of all of these options.  He does appear to be an excellent candidate for left radiocephalic AV fistula based on physical exam and SonoSite ultrasound.  I explained that presence of the fistula would not have any bearing on eventual need for dialysis but ideally would have access available if dialysis is required.  He also had some discussion regarding peritoneal dialysis.  I explained that I performed this procedure but had some knowledge.  He is leaning away from this currently but does have an appointment for practitioner.   Dialysis cath.  I explained that even if he had   [name], MD [facility] Tel [phone] Pager  [phone]  Note: Portions of this report may have been transcribed using voice recognition software.  Every effort has been made to ensure accuracy; however, inadvertent computerized transcription errors may still be present.

## 2022-01-15 NOTE — Progress Notes (Signed)
  [facility]ist of [place]  Patient name: [name] MRN: [id number] DOB: 10/03/1960 Sex: male  REASON FOR CONSULT: Discuss access for hemodialysis  Nephrologist: Bhutani  HPI: [name] is a 61 y.o. male, who is here today for discussion of access for hemodialysis.  He is here today with his wife he has progressive renal insufficiency related to hypertension and diabetes.  Fattening currently is in the 4 range.  Is not been on hemodialysis.  He is right-handed.  He does not have a pacemaker.  He is on chronic Eliquis related to atrial flutter.  Does have prior history of stenting for coronary artery disease.  2018 had Fournier's gangrene with prolonged series of debridement and antibiotic treatment.  He had a PICC line in his right basilic vein.  No PICC lines in the left.  Past Medical History:  Diagnosis Date   Abscess 04/2017   Scrotal   Anemia    Arteriosclerotic cardiovascular disease (ASCVD)    MI-2000s; stent to the proximal LAD and diagonal in 2001; stress nuclear in 2008-impaired exercise capacity, left ventricular dilatation, moderately to severely depressed EF, apical, inferior and anteroseptal scar   Arthritis    Atrial flutter (HCC)    Bence-Jones proteinuria 05/05/2011   Cellulitis of leg    both legs   Chronic diarrhea    Chronic kidney disease, stage 3, mod decreased GFR (HCC)    Creatinine of 1.84 in 06/2011 and 1.5 in 07/2011   Diabetes mellitus    Insulin   Dysrhythmia    AFlutter   GERD (gastroesophageal reflux disease)    Gout    Hyperlipidemia    Hypertension    Injection site reaction    Multiple myeloma 07/01/2011   Myocardial infarction (HCC) 2000   Obesity    Pedal edema    Venous insufficiency   Sleep apnea    uses cpap   Ulcer     Family History  Problem Relation Age of Onset   Heart disease Mother    Cancer Mother    Diabetes Father    Arthritis Other    Anesthesia problems  Neg Hx    Hypotension Neg Hx    Malignant hyperthermia Neg Hx    Pseudochol deficiency Neg Hx     SOCIAL HISTORY: Social History   Socioeconomic History   Marital status: Married    Spouse name: Not on file   Number of children: Not on file   Years of education: 16   Highest education level: Not on file  Occupational History   Occupation: Funeral Director   Occupation: Funeral Director    Employer: [employer]  Tobacco Use   Smoking status: Former    Packs/day: 0.25    Years: 1.00    Total pack years: 0.25    Types: Cigarettes, Cigars    Quit date: 05/17/2001    Years since quitting: 20.6   Smokeless tobacco: Never  Vaping Use   Vaping Use: Never used  Substance and Sexual Activity   Alcohol use: No    Alcohol/week: 0.0 standard drinks of alcohol   Drug use: No   Sexual activity: Yes    Birth control/protection: None  Other Topics Concern   Not on file  Social History Narrative   Not on file   Social Determinants of Health   Financial Resource Strain: Not on file  Food Insecurity: Not on file  Transportation Needs: Not on file  Physical Activity: Not on   file  Stress: Not on file  Social Connections: Not on file  Intimate Partner Violence: Not on file    Allergies  Allergen Reactions   Beef-Derived Products Diarrhea   Pork-Derived Products Diarrhea   Other     Band aids     Current Outpatient Medications  Medication Sig Dispense Refill   acetaminophen (TYLENOL) 500 MG tablet Take 1,000 mg by mouth every 6 (six) hours as needed.     acyclovir (ZOVIRAX) 400 MG tablet Take 1 tablet (400 mg total) by mouth every morning. 30 tablet 5   allopurinol (ZYLOPRIM) 300 MG tablet TAKE ONE TABLET BY MOUTH ONCE DAILY. 30 tablet 2   atorvastatin (LIPITOR) 80 MG tablet TAKE ONE TABLET BY MOUTH AT BEDTIME. 90 tablet 3   Blood Glucose Monitoring Suppl (ACCU-CHEK GUIDE) w/Device KIT USE TO CHECK BLOOD SUGARSITWICE DAILY.     calcitRIOL (ROCALTROL) 0.25 MCG  capsule Take 0.25 mcg by mouth 3 (three) times a week. Monday, Wednesday and Friday     Cholecalciferol (D2000 ULTRA STRENGTH) 50 MCG (2000 UT) CAPS Take by mouth.     Ciclopirox 0.77 % gel SMARTSIG:Sparingly Topical Twice Daily     CONTOUR NEXT TEST test strip      CREON 36000-114000 units CPEP capsule Take 36,000 Units by mouth 3 (three) times daily as needed.     dicyclomine (BENTYL) 20 MG tablet TAKE 1 TABLET BY MOUTH BEFORE MEALS 3 TIMES DAILY. 90 tablet 2   Eluxadoline 100 MG TABS Take 100 mg by mouth 2 (two) times daily with a meal.     ferrous sulfate 325 (65 FE) MG EC tablet Take 45 mg by mouth. Every other day     gabapentin (NEURONTIN) 600 MG tablet TAKE 1 TABLET BY MOUTH ONCE A DAY. 30 tablet 0   hydrALAZINE (APRESOLINE) 100 MG tablet Take 1 tablet (100 mg total) by mouth 3 (three) times daily. 270 tablet 3   insulin aspart (NOVOLOG) 100 UNIT/ML injection Inject 1-3 Units into the skin 3 (three) times daily with meals. Sliding scale over 150= 1 unit, 200= 2 units,250= 3 units     insulin detemir (LEVEMIR) 100 UNIT/ML injection Inject 0.1 mLs (10 Units total) into the skin every morning. 10 mL 11   INSULIN SYRINGE .5CC/29G 29G X 1/2" 0.5 ML MISC      loratadine (CLARITIN) 10 MG tablet Take 10 mg by mouth every morning.     Magnesium Cl-Calcium Carbonate (SLOW-MAG PO) Take 1 tablet by mouth every morning.     Microlet Lancets MISC      Multiple Vitamins-Minerals (MULTIVITAMINS THER. W/MINERALS) TABS Take 1 tablet by mouth daily.     nitroGLYCERIN (NITROSTAT) 0.4 MG SL tablet DISSOLVE 1 TABLET UNDER TONGUE EVERY 5 MINUTES UP TO 15 MIN FOR CHESTPAIN. IF NO RELIEF CALL 911. 25 tablet 0   pantoprazole (PROTONIX) 40 MG tablet Take 40 mg by mouth daily.     potassium chloride SA (KLOR-CON) 20 MEQ tablet Take 20 mEq by mouth See admin instructions. Take 40 meq in the morning and 20 meq every evening.     spironolactone (ALDACTONE) 25 MG tablet Take 25 mg by mouth daily.     SURE COMFORT PEN  NEEDLES 29G X 12.7MM MISC USE AS DIRECTED FOURCTIMES DAILY.     torsemide (DEMADEX) 20 MG tablet Take by mouth daily. Take 60 mg am,40 mg pm     XARELTO 20 MG TABS tablet SMARTSIG:1 Tablet(s) By Mouth Every Evening  isosorbide mononitrate (IMDUR) 30 MG 24 hr tablet Take 1 tablet (30 mg total) by mouth daily. 90 tablet 3   sodium bicarbonate 650 MG tablet Take 650 mg by mouth 3 (three) times daily. (Patient not taking: Reported on 01/15/2022)     No current facility-administered medications for this visit.   Facility-Administered Medications Ordered in Other Visits  Medication Dose Route Frequency Provider Last Rate Last Admin   sodium chloride 0.9 % injection 10 mL  10 mL Intravenous Once Formanek, Gregory, MD        REVIEW OF SYSTEMS:  [X] denotes positive finding, [ ] denotes negative finding Cardiac  Comments:  Chest pain or chest pressure:    Shortness of breath upon exertion:    Short of breath when lying flat:    Irregular heart rhythm:        Vascular    Pain in calf, thigh, or hip brought on by ambulation:    Pain in feet at night that wakes you up from your sleep:     Blood clot in your veins:    Leg swelling:         Pulmonary    Oxygen at home:    Productive cough:     Wheezing:         Neurologic    Sudden weakness in arms or legs:     Sudden numbness in arms or legs:     Sudden onset of difficulty speaking or slurred speech:    Temporary loss of vision in one eye:     Problems with dizziness:         Gastrointestinal    Blood in stool:     Vomited blood:         Genitourinary    Burning when urinating:     Blood in urine:        Psychiatric    Major depression:         Hematologic    Bleeding problems:    Problems with blood clotting too easily:        Skin    Rashes or ulcers:        Constitutional    Fever or chills:      PHYSICAL EXAM: Vitals:   01/15/22 0911  BP: 133/75  Pulse: (!) 52  Resp: 14  Temp: 97.9 F (36.6 C)  TempSrc:  Temporal  SpO2: 96%  Weight: (!) 362 lb 12.8 oz (164.6 kg)  Height: 6' 2" (1.88 m)    GENERAL: The patient is a well-nourished male, in no acute distress. The vital signs are documented above. CARDIOVASCULAR: 2+ radial pulses bilaterally.  Moderate to large size cephalic vein at the wrist and forearm bilaterally. PULMONARY: There is good air exchange  MUSCULOSKELETAL: There are no major deformities or cyanosis. NEUROLOGIC: No focal weakness or paresthesias are detected. SKIN: There are no ulcers or rashes noted. PSYCHIATRIC: The patient has a normal affect.  DATA:  I imaged both arms with SonoSite ultrasound.  This does show a moderate cephalic vein in the forearm bilaterally.  This does extend the left arm to the upper arm cephalic vein.  He appears to have a small basilic vein in the left upper arm  MEDICAL ISSUES: Had a long discussion with the patient and his wife regarding options for typically hemodialysis back oxygen as desirable.  After discussion with the patient and his wife wish to proceed with fistula creation.  Please schedule this for him at the hospital   on 01/22/2019.  I discussed tunneled dialysis catheter for urgent dialysis and also AV graft and AV fistula.  I discussed the advantages and disadvantages of all of these options.  He does appear to be an excellent candidate for left radiocephalic AV fistula based on physical exam and SonoSite ultrasound.  I explained that presence of the fistula would not have any bearing on eventual need for dialysis but ideally would have access available if dialysis is required.  He also had some discussion regarding peritoneal dialysis.  I explained that I performed this procedure but had some knowledge.  He is leaning away from this currently but does have an appointment for practitioner.   Dialysis cath.  I explained that even if he had   Tomasa Dobransky F. Wilberta Dorvil, MD FACS [facility] [place] Office Tel [phone] Pager  [phone]  Note: Portions of this report may have been transcribed using voice recognition software.  Every effort has been made to ensure accuracy; however, inadvertent computerized transcription errors may still be present.  

## 2022-01-16 ENCOUNTER — Telehealth: Payer: Self-pay | Admitting: Cardiology

## 2022-01-16 NOTE — Telephone Encounter (Signed)
Left message asking patient to give [place] his current weight so I can share with MD.We need to verify Torsemide dose also.

## 2022-01-16 NOTE — Telephone Encounter (Signed)
  Pt c/o swelling: STAT is pt has developed SOB within 24 hours  If swelling, where is the swelling located? none  How much weight have you gained and in what time span? Almost 5 lbs since last week Wednesday.   Have you gained 3 pounds in a day or 5 pounds in a week?   Do you have a log of your daily weights (if so, list)?   Are you currently taking a fluid pill? yes  Are you currently SOB? no  Have you traveled recently? no   Patient would called concerning his weight gain, since last Wednesday he has gained almost 5 lbs. He states he is bloated.

## 2022-01-17 NOTE — Telephone Encounter (Signed)
I spoke with patient and he will increase Torsemide to 40 mg bid and call us Monday with update-per Dr.Branch.

## 2022-01-18 ENCOUNTER — Other Ambulatory Visit: Payer: Self-pay | Admitting: Cardiology

## 2022-01-20 ENCOUNTER — Encounter (HOSPITAL_COMMUNITY)
Admission: RE | Admit: 2022-01-20 | Discharge: 2022-01-20 | Disposition: A | Discharge status: Home / Self Care | Payer: BC Managed Care – PPO | Source: Ambulatory Visit | Attending: Vascular Surgery | Admitting: Vascular Surgery

## 2022-01-20 NOTE — Pre-Procedure Instructions (Signed)
Attempted pre-op phone call. Left VM for him to call us back on [phone].

## 2022-01-20 NOTE — Pre-Procedure Instructions (Signed)
Also left VM on wife's phone, [phone] for someone to call me back about surgery tomorrow.

## 2022-01-21 ENCOUNTER — Ambulatory Visit (HOSPITAL_COMMUNITY): Payer: BC Managed Care – PPO | Admitting: Anesthesiology

## 2022-01-21 ENCOUNTER — Encounter: Payer: Self-pay | Admitting: Cardiology

## 2022-01-21 ENCOUNTER — Other Ambulatory Visit: Payer: Self-pay

## 2022-01-21 ENCOUNTER — Encounter (HOSPITAL_COMMUNITY)
Admission: RE | Disposition: A | Discharge status: Home / Self Care | Payer: Self-pay | Source: Home / Self Care | Attending: Vascular Surgery

## 2022-01-21 ENCOUNTER — Encounter (HOSPITAL_COMMUNITY): Payer: Self-pay | Admitting: Vascular Surgery

## 2022-01-21 ENCOUNTER — Telehealth: Payer: Self-pay | Admitting: Cardiology

## 2022-01-21 ENCOUNTER — Ambulatory Visit (HOSPITAL_COMMUNITY)
Admission: RE | Admit: 2022-01-21 | Discharge: 2022-01-21 | Disposition: A | Discharge status: Home / Self Care | Payer: BC Managed Care – PPO | Attending: Vascular Surgery | Admitting: Vascular Surgery

## 2022-01-21 DIAGNOSIS — I2510 Atherosclerotic heart disease of native coronary artery without angina pectoris: Secondary | ICD-10-CM | POA: Insufficient documentation

## 2022-01-21 DIAGNOSIS — K219 Gastro-esophageal reflux disease without esophagitis: Secondary | ICD-10-CM | POA: Insufficient documentation

## 2022-01-21 DIAGNOSIS — E1122 Type 2 diabetes mellitus with diabetic chronic kidney disease: Secondary | ICD-10-CM | POA: Insufficient documentation

## 2022-01-21 DIAGNOSIS — I120 Hypertensive chronic kidney disease with stage 5 chronic kidney disease or end stage renal disease: Secondary | ICD-10-CM | POA: Diagnosis not present

## 2022-01-21 DIAGNOSIS — Z7901 Long term (current) use of anticoagulants: Secondary | ICD-10-CM | POA: Diagnosis not present

## 2022-01-21 DIAGNOSIS — N185 Chronic kidney disease, stage 5: Secondary | ICD-10-CM

## 2022-01-21 DIAGNOSIS — I252 Old myocardial infarction: Secondary | ICD-10-CM | POA: Insufficient documentation

## 2022-01-21 DIAGNOSIS — I4892 Unspecified atrial flutter: Secondary | ICD-10-CM | POA: Diagnosis not present

## 2022-01-21 DIAGNOSIS — Z87891 Personal history of nicotine dependence: Secondary | ICD-10-CM | POA: Diagnosis not present

## 2022-01-21 DIAGNOSIS — E1165 Type 2 diabetes mellitus with hyperglycemia: Secondary | ICD-10-CM | POA: Diagnosis not present

## 2022-01-21 DIAGNOSIS — N186 End stage renal disease: Secondary | ICD-10-CM | POA: Diagnosis present

## 2022-01-21 DIAGNOSIS — N184 Chronic kidney disease, stage 4 (severe): Secondary | ICD-10-CM

## 2022-01-21 DIAGNOSIS — I4891 Unspecified atrial fibrillation: Secondary | ICD-10-CM | POA: Diagnosis not present

## 2022-01-21 HISTORY — PX: AV FISTULA PLACEMENT: SHX1204

## 2022-01-21 LAB — POCT I-STAT, CHEM 8
BUN: 95 mg/dL — ABNORMAL HIGH (ref 8–23)
Calcium, Ion: 1.14 mmol/L — ABNORMAL LOW (ref 1.15–1.40)
Chloride: 105 mmol/L (ref 98–111)
Creatinine, Ser: 4.8 mg/dL — ABNORMAL HIGH (ref 0.61–1.24)
Glucose, Bld: 117 mg/dL — ABNORMAL HIGH (ref 70–99)
HCT: 33 % — ABNORMAL LOW (ref 39.0–52.0)
Hemoglobin: 11.2 g/dL — ABNORMAL LOW (ref 13.0–17.0)
Potassium: 3.9 mmol/L (ref 3.5–5.1)
Sodium: 143 mmol/L (ref 135–145)
TCO2: 25 mmol/L (ref 22–32)

## 2022-01-21 LAB — GLUCOSE, CAPILLARY: Glucose-Capillary: 135 mg/dL — ABNORMAL HIGH (ref 70–99)

## 2022-01-21 SURGERY — ARTERIOVENOUS (AV) FISTULA CREATION
Anesthesia: General | Site: Arm Lower | Laterality: Left

## 2022-01-21 MED ORDER — PROPOFOL 500 MG/50ML IV EMUL
INTRAVENOUS | Status: DC | PRN
  Administered 2022-01-21: 75 ug/kg/min via INTRAVENOUS
  Administered 2022-01-21: 55 ug/kg/min via INTRAVENOUS

## 2022-01-21 MED ORDER — FENTANYL CITRATE (PF) 100 MCG/2ML IJ SOLN
INTRAMUSCULAR | Status: DC | PRN
  Administered 2022-01-21 (×3): 25 ug via INTRAVENOUS

## 2022-01-21 MED ORDER — FENTANYL CITRATE (PF) 100 MCG/2ML IJ SOLN
INTRAMUSCULAR | Status: AC
  Filled 2022-01-21: qty 2

## 2022-01-21 MED ORDER — ONDANSETRON HCL 4 MG/2ML IJ SOLN: 4.0000 mg | Freq: Once | INTRAMUSCULAR | Status: DC | PRN

## 2022-01-21 MED ORDER — PROPOFOL 500 MG/50ML IV EMUL
INTRAVENOUS | Status: AC
  Filled 2022-01-21: qty 50

## 2022-01-21 MED ORDER — FENTANYL CITRATE PF 50 MCG/ML IJ SOSY: 25.0000 ug | PREFILLED_SYRINGE | INTRAMUSCULAR | Status: DC | PRN

## 2022-01-21 MED ORDER — LIDOCAINE HCL (PF) 2 % IJ SOLN
INTRAMUSCULAR | Status: AC
  Filled 2022-01-21: qty 5

## 2022-01-21 MED ORDER — LIDOCAINE-EPINEPHRINE 0.5 %-1:200000 IJ SOLN
INTRAMUSCULAR | Status: DC | PRN
  Administered 2022-01-21: 6 mL

## 2022-01-21 MED ORDER — SODIUM CHLORIDE 0.9 % IV SOLN: INTRAVENOUS | Status: DC

## 2022-01-21 MED ORDER — CHLORHEXIDINE GLUCONATE 4 % EX LIQD: 60.0000 mL | Freq: Once | CUTANEOUS | Status: DC

## 2022-01-21 MED ORDER — 0.9 % SODIUM CHLORIDE (POUR BTL) OPTIME
TOPICAL | Status: DC | PRN
  Administered 2022-01-21: 1000 mL

## 2022-01-21 MED ORDER — LIDOCAINE HCL (CARDIAC) PF 50 MG/5ML IV SOSY
PREFILLED_SYRINGE | INTRAVENOUS | Status: DC | PRN
  Administered 2022-01-21: 60 mg via INTRAVENOUS

## 2022-01-21 MED ORDER — HEPARIN SODIUM (PORCINE) 1000 UNIT/ML IJ SOLN
INTRAMUSCULAR | Status: AC
  Filled 2022-01-21: qty 6

## 2022-01-21 MED ORDER — CEFAZOLIN IN SODIUM CHLORIDE 3-0.9 GM/100ML-% IV SOLN
3.0000 g | INTRAVENOUS | Status: AC
  Administered 2022-01-21: 3 g via INTRAVENOUS
  Filled 2022-01-21: qty 100

## 2022-01-21 MED ORDER — OXYCODONE-ACETAMINOPHEN 5-325 MG PO TABS: 1.0000 | ORAL_TABLET | Freq: Four times a day (QID) | ORAL | 0 refills | Status: DC | PRN

## 2022-01-21 MED ORDER — HEPARIN 6000 UNIT IRRIGATION SOLUTION
Status: DC | PRN
  Administered 2022-01-21: 1

## 2022-01-21 MED ORDER — PROPOFOL 10 MG/ML IV BOLUS
INTRAVENOUS | Status: DC | PRN
  Administered 2022-01-21: 40 mg via INTRAVENOUS

## 2022-01-21 MED ORDER — LIDOCAINE-EPINEPHRINE 0.5 %-1:200000 IJ SOLN
INTRAMUSCULAR | Status: AC
  Filled 2022-01-21: qty 1

## 2022-01-21 SURGICAL SUPPLY — 38 items
ADH SKN CLS APL DERMABOND .7 (GAUZE/BANDAGES/DRESSINGS) ×1
ARMBAND PINK RESTRICT EXTREMIT (MISCELLANEOUS) ×1 IMPLANT
BAG HAMPER (MISCELLANEOUS) ×1 IMPLANT
CANNULA VESSEL 3MM 2 BLNT TIP (CANNULA) ×1 IMPLANT
CLIP LIGATING EXTRA MED SLVR (CLIP) ×1 IMPLANT
CLIP LIGATING EXTRA SM BLUE (MISCELLANEOUS) ×1 IMPLANT
COVER LIGHT HANDLE STERIS (MISCELLANEOUS) ×2 IMPLANT
COVER MAYO STAND XLG (MISCELLANEOUS) ×1 IMPLANT
COVER PROBE U/S 5X48 (MISCELLANEOUS) IMPLANT
DERMABOND ADVANCED (GAUZE/BANDAGES/DRESSINGS) ×1
DERMABOND ADVANCED .7 DNX12 (GAUZE/BANDAGES/DRESSINGS) ×1 IMPLANT
ELECT REM PT RETURN 9FT ADLT (ELECTROSURGICAL) ×1
ELECTRODE REM PT RTRN 9FT ADLT (ELECTROSURGICAL) ×1 IMPLANT
GAUZE SPONGE 4X4 12PLY STRL (GAUZE/BANDAGES/DRESSINGS) ×1 IMPLANT
GLOVE BIOGEL PI IND STRL 7.0 (GLOVE) ×2 IMPLANT
GLOVE BIOGEL PI INDICATOR 7.0 (GLOVE) ×2
GLOVE SURG MICRO LTX SZ7.5 (GLOVE) ×1 IMPLANT
GOWN STRL REUS W/TWL LRG LVL3 (GOWN DISPOSABLE) ×3 IMPLANT
IV NS 500ML (IV SOLUTION) ×1
IV NS 500ML BAXH (IV SOLUTION) ×1 IMPLANT
KIT BLADEGUARD II DBL (SET/KITS/TRAYS/PACK) ×1 IMPLANT
KIT TURNOVER KIT A (KITS) ×1 IMPLANT
MANIFOLD NEPTUNE II (INSTRUMENTS) ×1 IMPLANT
MARKER SKIN DUAL TIP RULER LAB (MISCELLANEOUS) ×2 IMPLANT
NDL HYPO 18GX1.5 BLUNT FILL (NEEDLE) ×1 IMPLANT
NEEDLE HYPO 18GX1.5 BLUNT FILL (NEEDLE) ×1 IMPLANT
NS IRRIG 1000ML POUR BTL (IV SOLUTION) ×1 IMPLANT
PACK CV ACCESS (CUSTOM PROCEDURE TRAY) ×1 IMPLANT
PAD ARMBOARD 7.5X6 YLW CONV (MISCELLANEOUS) ×1 IMPLANT
SET BASIN LINEN APH (SET/KITS/TRAYS/PACK) ×1 IMPLANT
SOL PREP POV-IOD 4OZ 10% (MISCELLANEOUS) ×1 IMPLANT
SOL PREP PROV IODINE SCRUB 4OZ (MISCELLANEOUS) ×1 IMPLANT
SPONGE T-LAP 18X18 ~~LOC~~+RFID (SPONGE) ×1 IMPLANT
SUT PROLENE 6 0 CC (SUTURE) ×1 IMPLANT
SUT VIC AB 3-0 SH 27 (SUTURE) ×1
SUT VIC AB 3-0 SH 27X BRD (SUTURE) ×1 IMPLANT
SYR 10ML LL (SYRINGE) ×1 IMPLANT
UNDERPAD 30X36 HEAVY ABSORB (UNDERPADS AND DIAPERS) ×1 IMPLANT

## 2022-01-21 NOTE — Telephone Encounter (Signed)
Pt was calling to report his weight to Dr. Harl Bowie as requested: 356 lbs  Pt is requesting call back to discuss medication management.

## 2022-01-21 NOTE — Transfer of Care (Signed)
Immediate Anesthesia Transfer of Care Note  Patient: [name]  Procedure(s) Performed: LEFT ARM ARTERIOVENOUS (AV) FISTULA CREATION (Left: Arm Lower)  Patient Location: PACU  Anesthesia Type:General  Level of Consciousness: awake and patient cooperative  Airway & Oxygen Therapy: Patient Spontanous Breathing  Post-op Assessment: Report given to RN and Post -op Vital signs reviewed and stable  Post vital signs: Reviewed and stable  Last Vitals:  Vitals Value Taken Time  BP 120/72 0907  Temp 97.5 0907  Pulse 57 01/21/22 0906  Resp 14 01/21/22 0906  SpO2 98 % 01/21/22 0906  Vitals shown include unvalidated device data.  Last Pain:  Vitals:   01/21/22 0654  TempSrc: Oral  PainSc: 0-No pain         Complications: No notable events documented.

## 2022-01-21 NOTE — Discharge Instructions (Signed)
[facility]  Discharge Instructions  AV Fistula or Graft Surgery for Dialysis Access  Please refer to the following instructions for your post-procedure care. Your surgeon or physician assistant will discuss any changes with you.  Activity  You may drive the day following your surgery, if you are comfortable and no longer taking prescription pain medication. Resume full activity as the soreness in your incision resolves.  Bathing/Showering  You may shower after you go home. Keep your incision dry for 48 hours. Do not soak in a bathtub, hot tub, or swim until the incision heals completely. You may not shower if you have a hemodialysis catheter.  Incision Care  Clean your incision with mild soap and water after 48 hours. Pat the area dry with a clean towel. You do not need a bandage unless otherwise instructed. Do not apply any ointments or creams to your incision. You may have skin glue on your incision. Do not peel it off. It will come off on its own in about one week. Your arm may swell a bit after surgery. To reduce swelling use pillows to elevate your arm so it is above your heart. Your doctor will tell you if you need to lightly wrap your arm with an ACE bandage.  Diet  Resume your normal diet. There are not special food restrictions following this procedure. In order to heal from your surgery, it is CRITICAL to get adequate nutrition. Your body requires vitamins, minerals, and protein. Vegetables are the best source of vitamins and minerals. Vegetables also provide the perfect balance of protein. Processed food has little nutritional value, so try to avoid this.  Medications  Resume taking all of your medications. If your incision is causing pain, you may take over-the counter pain relievers such as acetaminophen (Tylenol). If you were prescribed a stronger pain medication, please be aware these medications can cause nausea and constipation. Prevent  nausea by taking the medication with a snack or meal. Avoid constipation by drinking plenty of fluids and eating foods with high amount of fiber, such as fruits, vegetables, and grains.  Do not take Tylenol if you are taking prescription pain medications.  Follow up Your surgeon may want to see you in the office following your access surgery. If so, this will be arranged at the time of your surgery.  Please call us immediately for any of the following conditions:  Increased pain, redness, drainage (pus) from your incision site Fever of 101 degrees or higher Severe or worsening pain at your incision site Hand pain or numbness.  Reduce your risk of vascular disease:  Stop smoking. If you would like help, call [facility] at 1-800-QUIT-NOW 919 320 8447) or [place] at [place] your cholesterol Maintain a desired weight Control your diabetes Keep your blood pressure down  Dialysis  It will take several weeks to several months for your new dialysis access to be ready for use. Your surgeon will determine when it is okay to use it. Your nephrologist will continue to direct your dialysis. You can continue to use your Permcath until your new access is ready for use.   01/21/2022 [name] [id number] 18-Aug-1960  Surgeon(s): [name], Franklin Meres, MD  Procedure(s): LEFT ARM ARTERIOVENOUS (AV) FISTULA CREATION   May stick graft immediately   May stick graft on designated area only:    Do not stick fistula for 12 weeks    If you have any questions, please call the office at [phone].

## 2022-01-21 NOTE — Telephone Encounter (Signed)
Left msg for pt to call office back regarding medication management.   Will route to provider for weight review.

## 2022-01-21 NOTE — Anesthesia Procedure Notes (Signed)
Date/Time: 01/21/2022 7:40 AM  Performed by: Vista Deck, CRNAPre-anesthesia Checklist: Patient identified, Emergency Drugs available, Suction available, Timeout performed and Patient being monitored Patient Re-evaluated:Patient Re-evaluated prior to induction Oxygen Delivery Method: Simple face mask

## 2022-01-21 NOTE — Anesthesia Preprocedure Evaluation (Signed)
Anesthesia Evaluation  Patient identified by MRN, date of birth, ID band Patient awake    Reviewed: Allergy & Precautions, H&P , NPO status , Patient's Chart, lab work & pertinent test results, reviewed documented beta blocker date and time   Airway Mallampati: II  TM Distance: >3 FB Neck ROM: full    Dental no notable dental hx.    Pulmonary sleep apnea , former smoker,    Pulmonary exam normal breath sounds clear to auscultation       Cardiovascular Exercise Tolerance: Good hypertension, + CAD, + Past MI and +CHF  + dysrhythmias Atrial Fibrillation  Rhythm:regular Rate:Normal     Neuro/Psych  Neuromuscular disease negative psych ROS   GI/Hepatic Neg liver ROS, GERD  Medicated,  Endo/Other  diabetes, Poorly Controlled, Type 2Morbid obesity  Renal/GU CRF and ESRFRenal disease  negative genitourinary   Musculoskeletal   Abdominal   Peds  Hematology  (+) Blood dyscrasia, anemia ,   Anesthesia Other Findings 1. Poor acoustic windows APical window is somewhat foreshortened. Severe  distal inferior , inferoseptal hypokinesis; apoap. Left ventricular  ejection fraction, by estimation, is 35 to 40%. The left ventricle has  moderately decreased function. The left  ventricular internal cavity size was severely dilated.  2. Right ventricular systolic function is normal. The right ventricular  size is normal. There is normal pulmonary artery systolic pressure.  3. The mitral valve is normal in structure. Mild mitral valve  regurgitation.  4. Echobright density along closeure line of Left coronary cusp 10 x 6  mm. . The aortic valve is tricuspid. Aortic valve regurgitation is not  visualized.  5. Aortic dilatation noted. There is mild dilatation of the ascending  aorta, measuring 41 mm.  6. The inferior vena cava is dilated in size with <50% respiratory  variability, suggesting right atrial pressure of 15 mmHg.    Reproductive/Obstetrics negative OB ROS                             Anesthesia Physical Anesthesia Plan  ASA: 3  Anesthesia Plan: General   Post-op Pain Management:    Induction:   PONV Risk Score and Plan: Ondansetron  Airway Management Planned:   Additional Equipment:   Intra-op Plan:   Post-operative Plan:   Informed Consent: I have reviewed the patients History and Physical, chart, labs and discussed the procedure including the risks, benefits and alternatives for the proposed anesthesia with the patient or authorized representative who has indicated his/her understanding and acceptance.     Dental Advisory Given  Plan Discussed with: CRNA  Anesthesia Plan Comments:         Anesthesia Quick Evaluation

## 2022-01-21 NOTE — Interval H&P Note (Signed)
History and Physical Interval Note:  01/21/2022 7:23 AM  [name]  has presented today for surgery, with the diagnosis of CKD IV.  The various methods of treatment have been discussed with the patient and family. After consideration of risks, benefits and other options for treatment, the patient has consented to  Procedure(s): LEFT ARM ARTERIOVENOUS (AV) FISTULA CREATION (Left) as a surgical intervention.  The patient's history has been reviewed, patient examined, no change in status, stable for surgery.  I have reviewed the patient's chart and labs.  Questions were answered to the patient's satisfaction.     Curt Jews

## 2022-01-21 NOTE — Op Note (Signed)
    OPERATIVE REPORT  DATE OF SURGERY: 01/21/2022  PATIENT: [name], 61 y.o. male MRN: [id number]  DOB: 01/21/1961  PRE-OPERATIVE DIAGNOSIS: Chronic renal insufficiency  POST-OPERATIVE DIAGNOSIS:  Same  PROCEDURE: Left radiocephalic AV fistula creation  SURGEON:  Curt Jews, M.D.  PHYSICIAN ASSISTANT: Fulton Mole, RNFA  The assistant was needed for exposure and to expedite the case  ANESTHESIA: MAC  EBL: per anesthesia record  Total I/O In: 350 [I.V.:250; IV Piggyback:100] Out: 10 [Blood:10]  BLOOD ADMINISTERED: none  DRAINS: none  SPECIMEN: none  COUNTS CORRECT:  YES  PATIENT DISPOSITION:  PACU - hemodynamically stable  PROCEDURE DETAILS: The patient was taken operating placed supine position where the area of the left arm left wrist were prepped and draped in usual sterile fashion.  Using local anesthesia, incision was made between the level of the cephalic vein and the radial artery at the wrist.  The vein was mobilized and tributary branches were ligated with 3-0 and 4-0 silk ties and divided.  The vein was ligated distally and was gently dilated with heparinized saline.  The vein was of good caliber.  Next the radial artery was exposed through the same incision.  The artery was moderate size and did have significant atherosclerotic disease.  The artery was encircled with red vessel loop Potts tie proximally and distally.  The Vesseloops were tightened for arterial control.  The artery was opened with an 11 blade and extended longitudinally with Potts scissors.  The vein was cut to the appropriate length and was spatulated.  The vein was sewn end-to-side to the artery with a running 6-0 Prolene suture.  The Vesseloops were loosened to allow flow through the vein.  There was excellent flow with a technically good anastomosis.  The wounds were irrigated with saline.  Hemostasis was obtained with electrocautery.  The wound was closed with 3-0 Vicryl in the  subcutaneous subcuticular tissue.  Sterile dressing was applied and the patient was transferred to the recovery room in stable condition   [name], M.D., [facility] 01/21/2022 9:38 AM  Note: Portions of this report may have been transcribed using voice recognition software.  Every effort has been made to ensure accuracy; however, inadvertent computerized transcription errors may still be present.

## 2022-01-22 ENCOUNTER — Other Ambulatory Visit: Payer: Self-pay

## 2022-01-22 MED ORDER — TORSEMIDE 20 MG PO TABS: 20.0000 mg | ORAL_TABLET | ORAL | 3 refills | Status: DC

## 2022-01-22 NOTE — Anesthesia Postprocedure Evaluation (Signed)
Anesthesia Post Note  Patient: [name]  Procedure(s) Performed: LEFT ARM ARTERIOVENOUS (AV) FISTULA CREATION (Left: Arm Lower)  Patient location during evaluation: Phase II Anesthesia Type: General Level of consciousness: awake Pain management: pain level controlled Vital Signs Assessment: post-procedure vital signs reviewed and stable Respiratory status: spontaneous breathing and respiratory function stable Cardiovascular status: blood pressure returned to baseline and stable Postop Assessment: no headache and no apparent nausea or vomiting Anesthetic complications: no Comments: Late entry   No notable events documented.   Last Vitals:  Vitals:   01/21/22 0930 01/21/22 0937  BP: (!) 143/77 (!) 142/76  Pulse: (!) 55 (!) 57  Resp: 18 15  Temp:  36.4 C  SpO2: 98% 99%    Last Pain:  Vitals:   01/21/22 0937  TempSrc: Oral  PainSc: [place]

## 2022-01-22 NOTE — Telephone Encounter (Signed)
Not sure where we are weight wise. He called last week with 5 lbs weight gain, we increased torsemide to '40mg'$  bid. Is the 356 lbs down from where he was. At our office visit 09/2021 he was 360 lbs   Zandra Abts MD

## 2022-01-23 ENCOUNTER — Encounter (HOSPITAL_COMMUNITY)
Admission: RE | Admit: 2022-01-23 | Discharge: 2022-01-23 | Disposition: A | Discharge status: Home / Self Care | Payer: BC Managed Care – PPO | Source: Ambulatory Visit | Attending: Nephrology | Admitting: Nephrology

## 2022-01-23 DIAGNOSIS — N1832 Chronic kidney disease, stage 3b: Secondary | ICD-10-CM | POA: Diagnosis not present

## 2022-01-23 LAB — POCT HEMOGLOBIN-HEMACUE: Hemoglobin: 10.7 g/dL — ABNORMAL LOW (ref 13.0–17.0)

## 2022-01-23 MED ORDER — EPOETIN ALFA-EPBX 10000 UNIT/ML IJ SOLN: 10000.0000 [IU] | Freq: Once | INTRAMUSCULAR | Status: DC

## 2022-01-27 ENCOUNTER — Encounter (HOSPITAL_COMMUNITY): Payer: Self-pay | Admitting: Vascular Surgery

## 2022-01-27 NOTE — Telephone Encounter (Signed)
Pt stated that a week ago he weighed in at 356 lb. 4 days ago he weighed at 352 lb, this morning he weighed at 354.4 lb. Pt stated that he believes the weight gain came from the anesthesia that he underwent from hand surgery making him constipated.

## 2022-01-27 NOTE — Telephone Encounter (Signed)
How wmuch has he lost since torsemide change  Zandra Abts MD

## 2022-02-06 ENCOUNTER — Encounter (HOSPITAL_COMMUNITY)
Admission: RE | Admit: 2022-02-06 | Discharge: 2022-02-06 | Disposition: A | Discharge status: Home / Self Care | Payer: BC Managed Care – PPO | Source: Ambulatory Visit | Attending: Nephrology | Admitting: Nephrology

## 2022-02-06 ENCOUNTER — Encounter (HOSPITAL_COMMUNITY): Payer: BC Managed Care – PPO

## 2022-02-06 VITALS — BP 123/70 | Resp 15

## 2022-02-06 DIAGNOSIS — N1832 Chronic kidney disease, stage 3b: Secondary | ICD-10-CM | POA: Insufficient documentation

## 2022-02-06 DIAGNOSIS — N289 Disorder of kidney and ureter, unspecified: Secondary | ICD-10-CM | POA: Diagnosis present

## 2022-02-06 LAB — POCT HEMOGLOBIN-HEMACUE: Hemoglobin: 10.8 g/dL — ABNORMAL LOW (ref 13.0–17.0)

## 2022-02-06 MED ORDER — EPOETIN ALFA-EPBX 10000 UNIT/ML IJ SOLN: 10000.0000 [IU] | Freq: Once | INTRAMUSCULAR | Status: DC

## 2022-02-06 MED ORDER — EPOETIN ALFA 10000 UNIT/ML IJ SOLN: 10000.0000 [IU] | Freq: Once | INTRAMUSCULAR | Status: DC

## 2022-02-13 ENCOUNTER — Other Ambulatory Visit: Payer: Self-pay | Admitting: *Deleted

## 2022-02-13 DIAGNOSIS — N184 Chronic kidney disease, stage 4 (severe): Secondary | ICD-10-CM

## 2022-02-20 ENCOUNTER — Encounter (HOSPITAL_COMMUNITY): Payer: Self-pay

## 2022-02-20 ENCOUNTER — Encounter (HOSPITAL_COMMUNITY)
Admission: RE | Admit: 2022-02-20 | Discharge: 2022-02-20 | Disposition: A | Discharge status: Home / Self Care | Payer: BC Managed Care – PPO | Source: Ambulatory Visit | Attending: Nephrology | Admitting: Nephrology

## 2022-02-20 VITALS — BP 118/80 | HR 50 | Temp 98.6°F | Resp 18 | Ht 74.0 in | Wt 355.8 lb

## 2022-02-20 DIAGNOSIS — N1832 Chronic kidney disease, stage 3b: Secondary | ICD-10-CM

## 2022-02-20 DIAGNOSIS — N289 Disorder of kidney and ureter, unspecified: Secondary | ICD-10-CM | POA: Diagnosis not present

## 2022-02-20 LAB — POCT HEMOGLOBIN-HEMACUE: Hemoglobin: 10.9 g/dL — ABNORMAL LOW (ref 13.0–17.0)

## 2022-02-20 MED ORDER — EPOETIN ALFA-EPBX 10000 UNIT/ML IJ SOLN: 10000.0000 [IU] | Freq: Once | INTRAMUSCULAR | Status: DC

## 2022-02-20 MED ORDER — EPOETIN ALFA 10000 UNIT/ML IJ SOLN: 10000.0000 [IU] | Freq: Once | INTRAMUSCULAR | Status: DC

## 2022-02-26 ENCOUNTER — Ambulatory Visit (INDEPENDENT_AMBULATORY_CARE_PROVIDER_SITE_OTHER): Payer: BC Managed Care – PPO

## 2022-02-26 ENCOUNTER — Ambulatory Visit (INDEPENDENT_AMBULATORY_CARE_PROVIDER_SITE_OTHER): Payer: BC Managed Care – PPO | Admitting: Vascular Surgery

## 2022-02-26 ENCOUNTER — Encounter: Payer: Self-pay | Admitting: Vascular Surgery

## 2022-02-26 VITALS — BP 137/77 | HR 51 | Temp 97.3°F | Ht 74.0 in | Wt 354.6 lb

## 2022-02-26 DIAGNOSIS — N184 Chronic kidney disease, stage 4 (severe): Secondary | ICD-10-CM

## 2022-02-26 NOTE — Progress Notes (Signed)
[facility] [place]  Patient name: [name] MRN: [id number] DOB: 05-17-1961 Sex: male  REASON FOR VISIT: Follow-up left radiocephalic AV fistula creation on 01/21/2022.  Hospital  Nephrologist: Theador Hawthorne  HPI: [name] is a 61 y.o. male here today for follow-up.  He is not on hemodialysis.  He has had no difficulty with his access.  Current Outpatient Medications  Medication Sig Dispense Refill   acetaminophen (TYLENOL) 500 MG tablet Take 1,000 mg by mouth every 6 (six) hours as needed for moderate pain.     acyclovir (ZOVIRAX) 400 MG tablet Take 1 tablet (400 mg total) by mouth every morning. 30 tablet 5   allopurinol (ZYLOPRIM) 300 MG tablet TAKE ONE TABLET BY MOUTH ONCE DAILY. 30 tablet 2   atorvastatin (LIPITOR) 80 MG tablet TAKE ONE TABLET BY MOUTH AT BEDTIME. 90 tablet 3   Blood Glucose Monitoring Suppl (ACCU-CHEK GUIDE) w/Device KIT USE TO CHECK BLOOD SUGARSITWICE DAILY.     calcitRIOL (ROCALTROL) 0.25 MCG capsule Take 0.25 mcg by mouth 3 (three) times a week. Monday, Wednesday and Friday     Calcium Carb-Cholecalciferol (CALCIUM 600/VITAMIN D PO) Take 1 tablet by mouth in the morning and at bedtime.     cholecalciferol (VITAMIN D3) 25 MCG (1000 UNIT) tablet Take 1,000 Units by mouth daily.     CONTOUR NEXT TEST test strip      CREON 36000-114000 units CPEP capsule Take 36,000 Units by mouth 2 (two) times daily with a meal.     dicyclomine (BENTYL) 20 MG tablet TAKE 1 TABLET BY MOUTH BEFORE MEALS 3 TIMES DAILY. 90 tablet 2   Eluxadoline 100 MG TABS Take 100 mg by mouth 2 (two) times daily with a meal.     ferrous sulfate 325 (65 FE) MG EC tablet Take 325 mg by mouth daily.     gabapentin (NEURONTIN) 600 MG tablet TAKE 1 TABLET BY MOUTH ONCE A DAY. (Patient taking differently: Take 300-600 mg by mouth See admin instructions. 600 mg in the morning, 300 mg at bedtime) 30 tablet 0   hydrALAZINE (APRESOLINE) 100 MG  tablet TAKE 1 TABLET BY MOUTH THREE TIMES A DAY. 270 tablet 2   insulin aspart (NOVOLOG) 100 UNIT/ML injection Inject 1-3 Units into the skin 2 (two) times daily as needed for high blood sugar. Sliding scale over 150= 1 unit, 200= 2 units,250= 3 units     insulin detemir (LEVEMIR) 100 UNIT/ML injection Inject 0.1 mLs (10 Units total) into the skin every morning. 10 mL 11   INSULIN SYRINGE .5CC/29G 29G X 1/2" 0.5 ML MISC      isosorbide mononitrate (IMDUR) 30 MG 24 hr tablet Take 30 mg by mouth daily.     loratadine (CLARITIN) 10 MG tablet Take 10 mg by mouth every morning.     Magnesium Cl-Calcium Carbonate (SLOW-MAG PO) Take 1 tablet by mouth every morning.     Microlet Lancets MISC      Multiple Vitamins-Minerals (MULTIVITAMINS THER. W/MINERALS) TABS Take 1 tablet by mouth daily.     nitroGLYCERIN (NITROSTAT) 0.4 MG SL tablet DISSOLVE 1 TABLET UNDER TONGUE EVERY 5 MINUTES UP TO 15 MIN FOR CHESTPAIN. IF NO RELIEF CALL 911. (Patient taking differently: Place 0.4 mg under the tongue every 5 (five) minutes as needed for chest pain.) 25 tablet 0   oxyCODONE-acetaminophen (PERCOCET) 5-325 MG tablet Take 1 tablet by mouth every 6 (six) hours as needed for severe pain. 8 tablet 0   pantoprazole (  PROTONIX) 40 MG tablet Take 40 mg by mouth daily.     spironolactone (ALDACTONE) 25 MG tablet Take 25 mg by mouth daily.     SURE COMFORT PEN NEEDLES 29G X 12.7MM MISC USE AS DIRECTED FOURCTIMES DAILY.     torsemide (DEMADEX) 20 MG tablet Take 1-2 tablets (20-40 mg total) by mouth See admin instructions. 40 mg in the morning, 20 mg in the evening 135 tablet 3   XARELTO 20 MG TABS tablet Take 20 mg by mouth daily with supper.     No current facility-administered medications for this visit.   Facility-Administered Medications Ordered in Other Visits  Medication Dose Route Frequency Provider Last Rate Last Admin   sodium chloride 0.9 % injection 10 mL  10 mL Intravenous Once Farrel Gobble, MD          PHYSICAL EXAM: Vitals:   02/26/22 0838  BP: 137/77  Pulse: (!) 51  Temp: (!) 97.3 F (36.3 C)  Weight: (!) 354 lb 9.6 oz (160.8 kg)  Height: _0  (1.88 m)    GENERAL: The patient is a well-nourished male, in no acute distress. The vital signs are documented above. Thrill in the [place] Urda maturation of his left radiocephalic AV fistula.  He does have exposed Vicryl absorbable suture and I explained that this will eventually spontaneously separated.  His wound is healing nicely.  Excellent thrill and bruit   Duplex today exam is good [place]an [place] maturation with his cephalic vein greater than 5 mm throughout its course.  He does have some elevated velocities at the end of the year in the chair venous anastomosis.  MEDICAL ISSUES: Doing well status post left radiocephalic fistula creation on 01/21/2022.  We will continue follow-up with [place].  Feel that he has a very high likelihood that this will be successful for hemodialysis should he progress to end-stage renal disease.  Will see [place] again on an as needed   [name], MD FACS [facility] Tel [phone]  Note: Portions of this report may have been transcribed using voice recognition software.  Every effort has been made to ensure accuracy; however, inadvertent computerized transcription errors may still be present.

## 2022-03-06 ENCOUNTER — Encounter (HOSPITAL_COMMUNITY): Payer: Self-pay

## 2022-03-06 ENCOUNTER — Encounter (HOSPITAL_COMMUNITY)
Admission: RE | Admit: 2022-03-06 | Discharge: 2022-03-06 | Disposition: A | Discharge status: Home / Self Care | Payer: BC Managed Care – PPO | Source: Ambulatory Visit | Attending: Nephrology | Admitting: Nephrology

## 2022-03-06 VITALS — BP 132/71 | HR 50 | Temp 98.6°F | Resp 18 | Ht 74.0 in | Wt 354.5 lb

## 2022-03-06 DIAGNOSIS — I2510 Atherosclerotic heart disease of native coronary artery without angina pectoris: Secondary | ICD-10-CM | POA: Diagnosis present

## 2022-03-06 DIAGNOSIS — I4892 Unspecified atrial flutter: Secondary | ICD-10-CM | POA: Insufficient documentation

## 2022-03-06 DIAGNOSIS — I5022 Chronic systolic (congestive) heart failure: Secondary | ICD-10-CM | POA: Insufficient documentation

## 2022-03-06 DIAGNOSIS — I10 Essential (primary) hypertension: Secondary | ICD-10-CM | POA: Diagnosis present

## 2022-03-06 DIAGNOSIS — N1832 Chronic kidney disease, stage 3b: Secondary | ICD-10-CM | POA: Insufficient documentation

## 2022-03-06 LAB — POCT HEMOGLOBIN-HEMACUE: Hemoglobin: 11.2 g/dL — ABNORMAL LOW (ref 13.0–17.0)

## 2022-03-06 MED ORDER — LACTATED RINGERS IV SOLN: INTRAVENOUS | Status: DC

## 2022-03-06 MED ORDER — CHLORHEXIDINE GLUCONATE 0.12 % MT SOLN: 15.0000 mL | Freq: Once | OROMUCOSAL | Status: DC

## 2022-03-06 MED ORDER — EPOETIN ALFA-EPBX 10000 UNIT/ML IJ SOLN: 10000.0000 [IU] | Freq: Once | INTRAMUSCULAR | Status: DC

## 2022-03-06 MED ORDER — ORAL CARE MOUTH RINSE: 15.0000 mL | Freq: Once | OROMUCOSAL | Status: DC

## 2022-03-14 ENCOUNTER — Emergency Department (HOSPITAL_COMMUNITY): Payer: BC Managed Care – PPO

## 2022-03-14 ENCOUNTER — Other Ambulatory Visit: Payer: Self-pay

## 2022-03-14 ENCOUNTER — Encounter (HOSPITAL_COMMUNITY): Payer: Self-pay

## 2022-03-14 ENCOUNTER — Emergency Department (HOSPITAL_COMMUNITY)
Admission: EM | Admit: 2022-03-14 | Discharge: 2022-03-14 | Disposition: A | Discharge status: Home / Self Care | Payer: BC Managed Care – PPO | Attending: Emergency Medicine | Admitting: Emergency Medicine

## 2022-03-14 DIAGNOSIS — E1122 Type 2 diabetes mellitus with diabetic chronic kidney disease: Secondary | ICD-10-CM | POA: Insufficient documentation

## 2022-03-14 DIAGNOSIS — R109 Unspecified abdominal pain: Secondary | ICD-10-CM

## 2022-03-14 DIAGNOSIS — N189 Chronic kidney disease, unspecified: Secondary | ICD-10-CM | POA: Insufficient documentation

## 2022-03-14 DIAGNOSIS — R1031 Right lower quadrant pain: Secondary | ICD-10-CM | POA: Insufficient documentation

## 2022-03-14 DIAGNOSIS — I129 Hypertensive chronic kidney disease with stage 1 through stage 4 chronic kidney disease, or unspecified chronic kidney disease: Secondary | ICD-10-CM | POA: Insufficient documentation

## 2022-03-14 DIAGNOSIS — Z79899 Other long term (current) drug therapy: Secondary | ICD-10-CM | POA: Insufficient documentation

## 2022-03-14 DIAGNOSIS — Z7984 Long term (current) use of oral hypoglycemic drugs: Secondary | ICD-10-CM | POA: Diagnosis not present

## 2022-03-14 DIAGNOSIS — Z794 Long term (current) use of insulin: Secondary | ICD-10-CM | POA: Insufficient documentation

## 2022-03-14 DIAGNOSIS — Z7901 Long term (current) use of anticoagulants: Secondary | ICD-10-CM | POA: Insufficient documentation

## 2022-03-14 LAB — COMPREHENSIVE METABOLIC PANEL
ALT: 31 U/L (ref 0–44)
AST: 33 U/L (ref 15–41)
Albumin: 3.8 g/dL (ref 3.5–5.0)
Alkaline Phosphatase: 82 U/L (ref 38–126)
Anion gap: 10 (ref 5–15)
BUN: 96 mg/dL — ABNORMAL HIGH (ref 8–23)
CO2: 26 mmol/L (ref 22–32)
Calcium: 9.2 mg/dL (ref 8.9–10.3)
Chloride: 104 mmol/L (ref 98–111)
Creatinine, Ser: 4.27 mg/dL — ABNORMAL HIGH (ref 0.61–1.24)
GFR, Estimated: 15 mL/min — ABNORMAL LOW (ref >60)
Glucose, Bld: 139 mg/dL — ABNORMAL HIGH (ref 70–99)
Potassium: 3.9 mmol/L (ref 3.5–5.1)
Sodium: 140 mmol/L (ref 135–145)
Total Bilirubin: 0.6 mg/dL (ref 0.3–1.2)
Total Protein: 6.8 g/dL (ref 6.5–8.1)

## 2022-03-14 LAB — URINALYSIS, ROUTINE W REFLEX MICROSCOPIC
Bilirubin Urine: NEGATIVE
Glucose, UA: NEGATIVE mg/dL
Ketones, ur: NEGATIVE mg/dL
Nitrite: NEGATIVE
Protein, ur: 100 mg/dL — AB
Specific Gravity, Urine: 1.009 (ref 1.005–1.030)
pH: 5 (ref 5.0–8.0)

## 2022-03-14 LAB — CBC
HCT: 33.8 % — ABNORMAL LOW (ref 39.0–52.0)
Hemoglobin: 11 g/dL — ABNORMAL LOW (ref 13.0–17.0)
MCH: 32.8 pg (ref 26.0–34.0)
MCHC: 32.5 g/dL (ref 30.0–36.0)
MCV: 100.9 fL — ABNORMAL HIGH (ref 80.0–100.0)
Platelets: 112 10*3/uL — ABNORMAL LOW (ref 150–400)
RBC: 3.35 MIL/uL — ABNORMAL LOW (ref 4.22–5.81)
RDW: 14.2 % (ref 11.5–15.5)
WBC: 6.2 10*3/uL (ref 4.0–10.5)
nRBC: 0 % (ref 0.0–0.2)

## 2022-03-14 LAB — LIPASE, BLOOD: Lipase: 51 U/L (ref 11–51)

## 2022-03-14 MED ORDER — PREDNISONE 20 MG PO TABS: 40.0000 mg | ORAL_TABLET | Freq: Every day | ORAL | 0 refills | Status: DC

## 2022-03-14 NOTE — ED Provider Notes (Signed)
[facility] EMERGENCY DEPARTMENT Provider Note   CSN: [id number] Arrival date & time: 03/14/22  1103     History  No chief complaint on file.   [name] is a 61 y.o. male.  HPI Patient with a history of CKD, hypertension, diabetes, heart disease presents with right flank and posterior hip pain.  He notes that he has had pain intermittently for days, possibly weeks, has had previous improvement with prescribed medication.  No fevers, chills, central abdominal pain, nausea, vomiting, diarrhea.  Patient has a left-sided graft, preparation for possible dialysis.    Home Medications Prior to Admission medications   Medication Sig Start Date End Date Taking? Authorizing Provider  predniSONE (DELTASONE) 20 MG tablet Take 2 tablets (40 mg total) by mouth daily with breakfast. For the next four days 03/14/22  Yes Carmin Muskrat, MD  acetaminophen (TYLENOL) 500 MG tablet Take 1,000 mg by mouth every 6 (six) hours as needed for moderate pain.    [provider]  acyclovir (ZOVIRAX) 400 MG tablet Take 1 tablet (400 mg total) by mouth every morning. 02/18/17   Twana First, MD  allopurinol (ZYLOPRIM) 300 MG tablet TAKE ONE TABLET BY MOUTH ONCE DAILY. 09/15/17   Holley Bouche, NP  atorvastatin (LIPITOR) 80 MG tablet TAKE ONE TABLET BY MOUTH AT BEDTIME. 07/29/21   Arnoldo Lenis, MD  Blood Glucose Monitoring Suppl (ACCU-CHEK GUIDE) w/Device KIT USE TO CHECK BLOOD SUGARSITWICE DAILY. 07/01/21   [provider]  calcitRIOL (ROCALTROL) 0.25 MCG capsule Take 0.25 mcg by mouth 3 (three) times a week. Monday, Wednesday and Friday 07/29/21   [provider]  Calcium Carb-Cholecalciferol (CALCIUM 600/VITAMIN D PO) Take 1 tablet by mouth in the morning and at bedtime.    [provider]  cholecalciferol (VITAMIN D3) 25 MCG (1000 UNIT) tablet Take 1,000 Units by mouth daily.    [provider]  CONTOUR NEXT TEST test strip  09/20/18   [provider]  CREON 36000-114000 units CPEP capsule Take 36,000 Units by mouth 2 (two) times daily with a meal. 07/01/21   [provider]  dicyclomine (BENTYL) 20 MG tablet TAKE 1 TABLET BY MOUTH BEFORE MEALS 3 TIMES DAILY. 04/19/18   Rehman, Mechele Dawley, MD  Eluxadoline 100 MG TABS Take 100 mg by mouth 2 (two) times daily with a meal.    [provider]  ferrous sulfate 325 (65 FE) MG EC tablet Take 325 mg by mouth daily.    [provider]  gabapentin (NEURONTIN) 600 MG tablet TAKE 1 TABLET BY MOUTH ONCE A DAY. Patient taking differently: Take 300-600 mg by mouth See admin instructions. 600 mg in the morning, 300 mg at bedtime 03/01/19   Lockamy, Randi L, NP-C  hydrALAZINE (APRESOLINE) 100 MG tablet TAKE 1 TABLET BY MOUTH THREE TIMES A DAY. 01/20/22   Arnoldo Lenis, MD  insulin aspart (NOVOLOG) 100 UNIT/ML injection Inject 1-3 Units into the skin 2 (two) times daily as needed for high blood sugar. Sliding scale over 150= 1 unit, 200= 2 units,250= 3 units    [provider]  insulin detemir (LEVEMIR) 100 UNIT/ML injection Inject 0.1 mLs (10 Units total) into the skin every morning. 04/22/17   Janece Canterbury, MD  INSULIN SYRINGE .5CC/29G 29G X 1/2" 0.5 ML MISC  11/26/18   [provider]  isosorbide mononitrate (IMDUR) 30 MG 24 hr tablet Take 30 mg by mouth daily.    [provider]  loratadine (CLARITIN) 10  MG tablet Take 10 mg by mouth every morning.    [provider]  Magnesium Cl-Calcium Carbonate (SLOW-MAG PO) Take 1 tablet by mouth every morning.    [provider]  Microlet Lancets [place]  10/07/18   [provider]  Multiple Vitamins-Minerals (MULTIVITAMINS THER. W/MINERALS) TABS Take 1 tablet by mouth daily.    [provider]  nitroGLYCERIN (NITROSTAT) 0.4 MG SL tablet DISSOLVE 1 TABLET UNDER TONGUE EVERY 5 MINUTES UP TO 15 MIN FOR CHESTPAIN. IF NO RELIEF CALL 911. Patient taking differently: Place  0.4 mg under the tongue every 5 (five) minutes as needed for chest pain. 05/11/17   Holley Bouche, NP  oxyCODONE-acetaminophen (PERCOCET) 5-325 MG tablet Take 1 tablet by mouth every 6 (six) hours as needed for severe pain. 01/21/22   [name], MD  pantoprazole (PROTONIX) 40 MG tablet Take 40 mg by mouth daily.    [provider]  spironolactone (ALDACTONE) 25 MG tablet Take 25 mg by mouth daily. 10/02/21   [provider]  SURE COMFORT PEN NEEDLES 29G X 12.7MM MISC USE AS DIRECTED [place]. 06/12/19   [provider]  torsemide (DEMADEX) 20 MG tablet Take 1-2 tablets (20-40 mg total) by mouth See admin instructions. 40 mg in the morning, 20 mg in the evening 01/22/22   Arnoldo Lenis, MD  XARELTO 20 MG TABS tablet Take 20 mg by mouth daily with supper. 10/15/21   [provider]      Allergies    Beef-derived products, Pork-derived products, and Other    Review of Systems   Review of Systems  All other systems reviewed and are negative.   Physical Exam Updated Vital Signs BP 135/73 (BP Location: Right Arm)   Pulse (!) 53   Temp 98.2 F (36.8 C) (Oral)   Resp 18   Ht _0  (1.88 m)   Wt (!) 160.2 kg   SpO2 94%   BMI 45.35 kg/m  Physical Exam Vitals and nursing note reviewed.  Constitutional:      General: He is not in acute distress.    Appearance: He is well-developed. He is obese. He is not ill-appearing or diaphoretic.  HENT:     Head: Normocephalic and atraumatic.  Eyes:     Conjunctiva/sclera: Conjunctivae normal.  Cardiovascular:     Rate and Rhythm: Normal rate and regular rhythm.  Pulmonary:     Effort: Pulmonary effort is normal. No respiratory distress.     Breath sounds: No stridor.  Abdominal:     General: There is no distension.  Musculoskeletal:       Arms:  Skin:    General: Skin is warm and dry.  Neurological:     Mental Status: He is alert and oriented to person, place, and time.     ED Results  / Procedures / Treatments   Labs (all labs ordered are listed, but only abnormal results are displayed) Labs Reviewed  COMPREHENSIVE METABOLIC PANEL - Abnormal; Notable for the following components:      Result Value   Glucose, Bld 139 (*)    BUN 96 (*)    Creatinine, Ser 4.27 (*)    GFR, Estimated 15 (*)    All other components within normal limits  CBC - Abnormal; Notable for the following components:   RBC 3.35 (*)    Hemoglobin 11.0 (*)    HCT 33.8 (*)    MCV 100.9 (*)    Platelets 112 (*)  All other components within normal limits  URINALYSIS, ROUTINE W REFLEX MICROSCOPIC - Abnormal; Notable for the following components:   Color, Urine STRAW (*)    Hgb urine dipstick SMALL (*)    Protein, ur 100 (*)    Leukocytes,Ua TRACE (*)    Bacteria, UA FEW (*)    All other components within normal limits  LIPASE, BLOOD    EKG None  Radiology CT Renal Stone Study  Result Date: 03/14/2022 CLINICAL DATA:  Right flank and low back pain. Kidney stone suspected. Symptoms for 3 months. EXAM: CT ABDOMEN AND PELVIS WITHOUT CONTRAST TECHNIQUE: Multidetector CT imaging of the abdomen and pelvis was performed following the standard protocol without IV contrast. RADIATION DOSE REDUCTION: This exam was performed according to the departmental dose-optimization program which includes automated exposure control, adjustment of the mA and/or kV according to patient size and/or use of iterative reconstruction technique. COMPARISON:  Abdominopelvic CT 11/10/2017. FINDINGS: Lower chest: Chronic hernia involving the lateral aspect of the left hemidiaphragm is again noted with a broad-based measuring up to 6.8 cm on coronal image 68/6. There is herniation of the splenic flexure of the colon into the lower left chest, incompletely visualized. Unchanged chronic atelectasis or scarring at both lung bases. No significant pleural or pericardial effusion. There is atherosclerosis of the aorta and coronary arteries.  There is a small hiatal hernia. Hepatobiliary: No focal hepatic abnormality identified on noncontrast imaging. Incomplete distension of the gallbladder. Previously demonstrated small gallstone not well visualized. No evidence of gallbladder wall thickening or biliary dilatation. Pancreas: Unremarkable. No pancreatic ductal dilatation or surrounding inflammatory changes. Spleen: Normal in size without focal abnormality. Adrenals/Urinary Tract: Both adrenal glands appear normal. Right ureteral stent has been removed in the interval. There is a tiny nonobstructing calculus in the lower pole of the left kidney. No evidence of ureteral calculus or hydronephrosis. Nonspecific perinephric soft tissue stranding bilaterally, similar to previous study. There is mild bilateral renal cortical thinning. The bladder appears unremarkable for its degree of distention. Stomach/Bowel: No enteric contrast administered. Stable small hiatal hernia. There is prominent ingested material within the stomach. No evidence of bowel wall thickening, distention or surrounding inflammation. Small bowel extends into a periumbilical hernia, without signs of incarceration. The appendix appears normal. As above, the splenic flexure extends into and chronic hernia of the left hemidiaphragm and is incompletely visualized. Mildly prominent stool in the rectum. Vascular/Lymphatic: There are no enlarged abdominal or pelvic lymph nodes. Aortic and branch vessel atherosclerosis. Reproductive: The prostate gland appears unremarkable. Other: As above, umbilical hernia containing fat and small bowel without signs of incarceration or obstruction. Chronic left hemidiaphragm hernia/rupture. No ascites or free air. Musculoskeletal: No acute or significant osseous findings. Multilevel spondylosis with chronic ankylosis at L5-S1. IMPRESSION: 1. No acute findings or explanation for the patient's symptoms. 2. Nonobstructing left renal calculus. No evidence of ureteral  calculus or hydronephrosis. Interval removal of right ureteral stent. 3. Chronic left hemidiaphragm hernia/rupture with herniation of the splenic flexure of the colon into the lower left chest. No signs of incarceration or obstruction. 4. Small umbilical hernia containing fat and small bowel without signs of incarceration or obstruction. 5. Mildly prominent stool in the rectum. 6. Chronic atelectasis or scarring at both lung bases. 7.  Aortic Atherosclerosis (ICD10-I70.0). Electronically Signed   By: Richardean Sale M.D.   On: 03/14/2022 14:33    Procedures Procedures    Medications Ordered in ED Medications - No data to display  ED Course/ Medical Decision  Making/ A&P This patient with a Hx of multiple medical issues including CKD, hypertension, diabetes, cardiac disease presents to the ED for concern of flank and posterior superior hip pain, this involves an extensive number of treatment options, and is a complaint that carries with it a high risk of complications and morbidity.    The differential diagnosis includes kidney stone, kidney infection, sacroiliitis, infection   Social Determinants of Health:  Obesity, CKD  Additional history obtained:  Additional history and/or information obtained from chart review, notable for ongoing efforts with vascular surgery and nephrology in regards to the patient's renal dysfunction   After the initial evaluation, orders, including: CT renal stone, labs, urinalysis were initiated.   Patient placed on Cardiac and Pulse-Oximetry Monitors. The patient was maintained on a cardiac monitor.  The cardiac monitored showed an rhythm of 55 sinus bradycardia borderline The patient was also maintained on pulse oximetry. The readings were typically 100% room air normal   On repeat evaluation of the patient improved Patient awake, alert, sitting on the edge of his bed company by his daughter and wife. Lab Tests:  I personally interpreted labs.  The  pertinent results include: Persistent demonstration of CKD, slight improvement compared to most recent values  Imaging Studies ordered:  I independently visualized and interpreted imaging which showed no acute intracranial phenomenon, no acute intra-abdominal phenomena, I reviewed the images at bedside, illustrating key points to the patient and his family members I agree with the radiologist interpretation Dispostion / Final MDM:  After consideration of the diagnostic results and the patient's response to treatment, this adult male with CKD, ongoing episodic low back and right hip pain presents with discomfort.  Here he is awake, alert, distally neurovascular intact, no evidence for neuro system disruption.  No evidence for bacteremia, sepsis.  Patient has known kidney disease, this is largely unchanged.  Some suspicion for radicular pain given his lumbar spine with disc space narrowing appreciable on CT.  No indication for admission or hospitalization was a consideration given his renal dysfunction, need for pain control.  Patient comfortable with outpatient management.  Final Clinical Impression(s) / ED Diagnoses Final diagnoses:  Flank pain    Rx / DC Orders ED Discharge Orders          Ordered    predniSONE (DELTASONE) 20 MG tablet  Daily with breakfast        03/14/22 1511              Carmin Muskrat, MD 03/14/22 1513

## 2022-03-14 NOTE — ED Triage Notes (Signed)
Back pain on right lower side. Right pain on lower abdomen. Pain x 15month that's been off/on. Pt reports kidney disease.

## 2022-03-14 NOTE — Discharge Instructions (Signed)
As discussed, today's evaluation has been generally reassuring.  Your pain is likely due to changes in your spine and the nerves adjacent to it.  Please obtain and take the medication prescribed, and obtain and use the medicated patches we discussed: Salonpas with methyl salicylate and menthol.  Follow-up with your physician or return here for concerning changes in your condition.

## 2022-03-14 NOTE — ED Notes (Signed)
Patient transported to CT 

## 2022-03-20 ENCOUNTER — Encounter (HOSPITAL_COMMUNITY)
Admission: RE | Admit: 2022-03-20 | Discharge: 2022-03-20 | Disposition: A | Discharge status: Home / Self Care | Payer: BC Managed Care – PPO | Source: Ambulatory Visit | Attending: Nephrology | Admitting: Nephrology

## 2022-03-20 VITALS — BP 141/70 | HR 76 | Temp 98.2°F | Resp 18

## 2022-03-20 DIAGNOSIS — N1832 Chronic kidney disease, stage 3b: Secondary | ICD-10-CM

## 2022-03-20 MED ORDER — EPOETIN ALFA-EPBX 10000 UNIT/ML IJ SOLN: 10000.0000 [IU] | Freq: Once | INTRAMUSCULAR | Status: DC

## 2022-03-21 ENCOUNTER — Encounter: Payer: Self-pay | Admitting: Cardiology

## 2022-03-21 ENCOUNTER — Encounter: Payer: BC Managed Care – PPO | Admitting: Cardiology

## 2022-03-21 VITALS — BP 126/80 | HR 51 | Ht 74.0 in | Wt 362.0 lb

## 2022-03-21 DIAGNOSIS — I4892 Unspecified atrial flutter: Secondary | ICD-10-CM

## 2022-03-21 DIAGNOSIS — I10 Essential (primary) hypertension: Secondary | ICD-10-CM | POA: Diagnosis not present

## 2022-03-21 DIAGNOSIS — I5022 Chronic systolic (congestive) heart failure: Secondary | ICD-10-CM

## 2022-03-21 DIAGNOSIS — I2510 Atherosclerotic heart disease of native coronary artery without angina pectoris: Secondary | ICD-10-CM

## 2022-03-21 MED ORDER — RIVAROXABAN 15 MG PO TABS: 15.0000 mg | ORAL_TABLET | Freq: Every day | ORAL | 3 refills | Status: AC

## 2022-03-21 NOTE — Progress Notes (Signed)
Clinical Summary [name] is a 61 y.o.male seen today for follow up of the following medical problems.    1. CAD - prior stents as reported below - no chest pains.     2. Chronic systolic HF - 7035 echo LVEF 35-40% - 2015 LVEF 25-30% 2017 LVEF 35-40%   - 07/2019 ehco LVEF 25-30%, grade II diastolic dysfxn. -Limited ACE inhibitor's or angiotensin receptor blockers due to advanced chronic kidney disease - off beta blocker due to bradycardia from prior Dr Raliegh Ip notes.  - GFR <20, not on SGLT2i      10/2020 echo LVEF 30-35%, grade II dk.   07/2021 admission with volume overload - 07/2021 echo: LVE 35-40%, normal RV       - no recent edema, no SOB/DOE - home weights 355-358 lbs and stable. - taking torsemide 31m in AM and 26mPM     2.Anemia - he is on epogen per renal     3. Aflutter CrCl 41 today, lower than prior. Has been on xarelto 2051maily.  - no recent symptoms.        4. CKD IV - followed by Dr BhuTheador Hawthorne2023 fistula placed, close monitoring at this point in case HD becomes indicated     5. OSA - compliant with cpap    6. Multiple myeloma - being monitored      7. 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 retire Nov 9,2023   Past Medical History:  Diagnosis Date   Abscess 04/2017   Scrotal   Anemia    Arteriosclerotic cardiovascular disease (ASCVD)    MI-2000s; stent to the proximal LAD and diagonal in 2001; stress nuclear in 2008-impaired exercise capacity, left ventricular dilatation, moderately to severely depressed EF, apical, inferior and anteroseptal scar   Arthritis    Atrial flutter (HCC)    Bence-Jones proteinuria 05/05/2011   Cellulitis of leg    both legs   Chronic diarrhea    Chronic kidney disease, stage 3, mod  decreased GFR (HCC)    Creatinine of 1.84 in 06/2011 and 1.5 in 07/2011   Diabetes mellitus    Insulin   Dysrhythmia    AFlutter   GERD (gastroesophageal reflux disease)    Gout    Hyperlipidemia    Hypertension    Injection site reaction    Multiple myeloma 07/01/2011   Myocardial infarction ([facility]   Obesity    Pedal edema    Venous insufficiency   Sleep apnea    uses cpap   Ulcer      Allergies  Allergen Reactions   Beef-Derived Products Diarrhea    Gout   Pork-Derived Products Diarrhea    Gout    Other Dermatitis    Band aids NO ADHESIVES       Current Outpatient Medications  Medication Sig Dispense Refill   acetaminophen (TYLENOL) 500 MG tablet Take 1,000 mg by mouth every 6 (six) hours as needed for moderate pain.     acyclovir (ZOVIRAX) 400 MG tablet Take 1 tablet (400 mg total) by mouth every morning. 30 tablet 5   allopurinol (ZYLOPRIM) 300 MG tablet TAKE ONE TABLET BY MOUTH ONCE DAILY. 30 tablet 2   atorvastatin (LIPITOR) 80 MG tablet TAKE  ONE TABLET BY MOUTH AT BEDTIME. 90 tablet 3   Blood Glucose Monitoring Suppl (ACCU-CHEK GUIDE) w/Device KIT USE TO CHECK BLOOD SUGARSITWICE DAILY.     calcitRIOL (ROCALTROL) 0.25 MCG capsule Take 0.25 mcg by mouth 3 (three) times a week. Monday, Wednesday and Friday     Calcium Carb-Cholecalciferol (CALCIUM 600/VITAMIN D PO) Take 1 tablet by mouth in the morning and at bedtime.     cholecalciferol (VITAMIN D3) 25 MCG (1000 UNIT) tablet Take 1,000 Units by mouth daily.     CONTOUR NEXT TEST test strip      CREON 36000-114000 units CPEP capsule Take 36,000 Units by mouth 2 (two) times daily with a meal.     dicyclomine (BENTYL) 20 MG tablet TAKE 1 TABLET BY MOUTH BEFORE MEALS 3 TIMES DAILY. 90 tablet 2   Eluxadoline 100 MG TABS Take 100 mg by mouth 2 (two) times daily with a meal.     ferrous sulfate 325 (65 FE) MG EC tablet Take 325 mg by mouth daily.     gabapentin (NEURONTIN) 600 MG tablet TAKE 1 TABLET BY MOUTH ONCE A  DAY. (Patient taking differently: Take 300-600 mg by mouth See admin instructions. 600 mg in the morning, 300 mg at bedtime) 30 tablet 0   hydrALAZINE (APRESOLINE) 100 MG tablet TAKE 1 TABLET BY MOUTH THREE TIMES A DAY. 270 tablet 2   insulin aspart (NOVOLOG) 100 UNIT/ML injection Inject 1-3 Units into the skin 2 (two) times daily as needed for high blood sugar. Sliding scale over 150= 1 unit, 200= 2 units,250= 3 units     insulin detemir (LEVEMIR) 100 UNIT/ML injection Inject 0.1 mLs (10 Units total) into the skin every morning. 10 mL 11   INSULIN SYRINGE .5CC/29G 29G X 1/2" 0.5 ML MISC      isosorbide mononitrate (IMDUR) 30 MG 24 hr tablet Take 30 mg by mouth daily.     loratadine (CLARITIN) 10 MG tablet Take 10 mg by mouth every morning.     Magnesium Cl-Calcium Carbonate (SLOW-MAG PO) Take 1 tablet by mouth every morning.     Microlet Lancets MISC      Multiple Vitamins-Minerals (MULTIVITAMINS THER. W/MINERALS) TABS Take 1 tablet by mouth daily.     nitroGLYCERIN (NITROSTAT) 0.4 MG SL tablet DISSOLVE 1 TABLET UNDER TONGUE EVERY 5 MINUTES UP TO 15 MIN FOR CHESTPAIN. IF NO RELIEF CALL 911. (Patient taking differently: Place 0.4 mg under the tongue every 5 (five) minutes as needed for chest pain.) 25 tablet 0   oxyCODONE-acetaminophen (PERCOCET) 5-325 MG tablet Take 1 tablet by mouth every 6 (six) hours as needed for severe pain. 8 tablet 0   pantoprazole (PROTONIX) 40 MG tablet Take 40 mg by mouth daily.     predniSONE (DELTASONE) 20 MG tablet Take 2 tablets (40 mg total) by mouth daily with breakfast. For the next four days 8 tablet 0   spironolactone (ALDACTONE) 25 MG tablet Take 25 mg by mouth daily.     SURE COMFORT PEN NEEDLES 29G X 12.7MM MISC USE AS DIRECTED FOURCTIMES DAILY.     torsemide (DEMADEX) 20 MG tablet Take 1-2 tablets (20-40 mg total) by mouth See admin instructions. 40 mg in the morning, 20 mg in the evening 135 tablet 3   XARELTO 20 MG TABS tablet Take 20 mg by mouth daily with  supper.     No current facility-administered medications for this visit.   Facility-Administered Medications Ordered in Other Visits  Medication Dose Route Frequency Provider  Last Rate Last Admin   sodium chloride 0.9 % injection 10 mL  10 mL Intravenous Once Farrel Gobble, MD         Past Surgical History:  Procedure Laterality Date   ABSCESS DRAINAGE     Scrotal   AV FISTULA PLACEMENT Left 01/21/2022   Procedure: LEFT ARM ARTERIOVENOUS (AV) FISTULA CREATION;  Surgeon: [name], MD;  Location: [facility];  Service: Vascular;  Laterality: Left;   BIOPSY  01/02/2012   Procedure: BIOPSY;  Surgeon: Rogene Houston, MD;  [facility];  Service: Endoscopy;  Laterality: N/A;   BONE MARROW BIOPSY  05/13/11   CARDIAC CATHETERIZATION     cardiac stent   CARDIOVERSION N/A 10/13/2012   Procedure: CARDIOVERSION;  Surgeon: Yehuda Savannah, MD;  Location: [facility];  Service: Cardiovascular;  Laterality: N/A;   CATARACT EXTRACTION W/PHACO Left 02/13/2014   Procedure: CATARACT EXTRACTION PHACO AND INTRAOCULAR LENS PLACEMENT ([place]);  Surgeon: Tonny , MD;  Location: [facility];  Service: Ophthalmology;  Laterality: Left;  CDE:  7.67   CATARACT EXTRACTION W/PHACO Right 03/02/2014   Procedure: CATARACT EXTRACTION PHACO AND INTRAOCULAR LENS PLACEMENT RIGHT EYE CDE=16.81;  Surgeon: Tonny , MD;  Location: [facility];  Service: Ophthalmology;  Laterality: Right;   COLONOSCOPY  11/28/2011   Procedure: COLONOSCOPY;  Surgeon: Rogene Houston, MD;  [facility];  Service: Endoscopy;  Laterality: N/A;  [place] Right 12/16/2017   Procedure: CYSTOSCOPY WITH RIGHT RETROGRADE PYELOGRAM AND RIGHT URETERAL STENT REMOVAL;  Surgeon: Cleon Gustin, MD;  Location: [facility];  Service: Urology;  Laterality: Right;   CYSTOSCOPY W/ URETERAL STENT PLACEMENT Right 07/31/2017   Procedure: CYSTOSCOPY WITH RETROGRADE PYELOGRAM/URETERAL STENT PLACEMENT;  Surgeon: Cleon Gustin, MD;   [facility]: [facility];  Service: Urology;  Laterality: Right;   ESOPHAGOGASTRODUODENOSCOPY  01/02/2012   Procedure: ESOPHAGOGASTRODUODENOSCOPY (EGD);  Surgeon: Rogene Houston, MD;  [facility];  Service: Endoscopy;  Laterality: N/A;  100   ESOPHAGOGASTRODUODENOSCOPY N/A 09/20/2012   Procedure: ESOPHAGOGASTRODUODENOSCOPY (EGD);  Surgeon: Rogene Houston, MD;  [facility];  Service: Endoscopy;  Laterality: N/A;   EUS N/A 10/07/2012   Procedure: UPPER ENDOSCOPIC ULTRASOUND (EUS) LINEAR;  Surgeon: Milus Banister, MD;  [facility];  Service: Endoscopy;  Laterality: N/A;   INCISION AND DRAINAGE ABSCESS N/A 04/14/2017   Procedure: INCISION AND DRAINAGE ABSCESS;  Surgeon: Ceasar Mons, MD;  [facility];  Service: Urology;  Laterality: N/A;   INCISION AND DRAINAGE ABSCESS ANAL     IRRIGATION AND DEBRIDEMENT ABSCESS N/A 04/15/2017   Procedure: DEBRIDEMENT SCROTAL WOUND AND DRESSING CHANGE;  Surgeon: Ceasar Mons, MD;  [facility];  Service: Urology;  Laterality: N/A;   IRRIGATION AND DEBRIDEMENT ABSCESS N/A 04/17/2017   Procedure: IRRIGATION AND DEBRIDEMENT ABSCESS;  Surgeon: Ceasar Mons, MD;  [facility];  Service: Urology;  Laterality: N/A;  RM 3   LAPAROSCOPIC GASTRIC BANDING  2006   has been removed   OSTECTOMY Right 04/08/2017   Procedure: OSTECTOMY RIGHT GREAT TOE;  Surgeon: Caprice Beaver, DPM;  [facility];  Service: Podiatry;  Laterality: Right;   PORT-A-CATH REMOVAL Left 12/07/2012   Procedure: REMOVAL PORT-A-CATH;  Surgeon: Scherry Ran, MD;  Location[facility];  Service: General;  Laterality: Left;   PORT-A-CATH REMOVAL N/A 10/02/2017   Procedure: MINOR REMOVAL PORT-A-CATH AT BEDSIDE;  Surgeon: Donnie Mesa, MD;  Location: [facility];  Service: General;  Laterality: N/A;   PORTACATH PLACEMENT  07/07/2011   Procedure: INSERTION PORT-A-CATH;  Surgeon: Scherry Ran, MD;  [facility];  Service:  General;  Laterality: N/A;   PORTACATH PLACEMENT N/A 12/07/2012   Procedure: INSERTION PORT-A-CATH;  Surgeon: Scherry Ran, MD;  Location[facility];  Service: General;  Laterality: N/A;  Attempted portacath placement on left and right side   URETEROSCOPY Right 12/16/2017   Procedure: DIAGNOSTIC RIGHT URETEROSCOPY;  Surgeon: Cleon Gustin, MD;  [facility];  Service: Urology;  Laterality: Right;   WOUND DEBRIDEMENT Right 04/08/2017   Procedure: EXCISION ULCERATION RIGHT GREAT TOE;  Surgeon: Caprice Beaver, DPM;  Location: [facility];  Service: Podiatry;  Laterality: Right;   WRIST SURGERY     Left; removal of bone fragment     Allergies  Allergen Reactions   Beef-Derived Products Diarrhea    Gout   Pork-Derived Products Diarrhea    Gout    Other Dermatitis    Band aids NO ADHESIVES        Family History  Problem Relation Age of Onset   Heart disease Mother    Cancer Mother    Diabetes Father    Arthritis Other    Anesthesia problems Neg Hx    Hypotension Neg Hx    Malignant hyperthermia Neg Hx    Pseudochol deficiency Neg Hx      Social History Mr. Imai reports that he quit smoking about 20 years ago. His smoking use included cigarettes and cigars. He has a 0.25 pack-year smoking history. He has never used smokeless tobacco. Mr. Cueto reports no history of alcohol use.   Review of Systems CONSTITUTIONAL: No weight loss, fever, chills, weakness or fatigue.  HEENT: Eyes: No visual loss, blurred vision, double vision or yellow sclerae.No hearing loss, sneezing, congestion, runny nose or sore throat.  SKIN: No rash or itching.  CARDIOVASCULAR: per hpi RESPIRATORY: No shortness of breath, cough or sputum.  GASTROINTESTINAL: No anorexia, nausea, vomiting or diarrhea. No abdominal pain or blood.  GENITOURINARY: No burning on urination, no polyuria NEUROLOGICAL: No headache, dizziness, syncope, paralysis, ataxia, numbness or tingling in the extremities. No  change in bowel or bladder control.  MUSCULOSKELETAL: No muscle, back pain, joint pain or stiffness.  LYMPHATICS: No enlarged nodes. No history of splenectomy.  PSYCHIATRIC: No history of depression or anxiety.  ENDOCRINOLOGIC: No reports of sweating, cold or heat intolerance. No polyuria or polydipsia.  Marland Kitchen   Physical Examination Today's Vitals   03/21/22 0914  BP: 126/80  Pulse: (!) 51  SpO2: 98%  Weight: (!) 362 lb (164.2 kg)  Height: $Remove'6\' 2"'[id number]$  (1.88 m)   Body mass index is 46.48 kg/m.  Gen: resting comfortably, no acute distress HEENT: no scleral icterus, pupils equal round and reactive, no palptable cervical adenopathy,  CV: RRR, no m/r/g no jvd Resp: Clear to auscultation bilaterally GI: abdomen is soft, non-tender, non-distended, normal bowel sounds, no hepatosplenomegaly MSK: extremities are warm, no edema.  Skin: warm, no rash Neuro:  no focal deficits Psych: appropriate affect   Diagnostic Studies  10/2020 echo IMPRESSIONS     1. Left ventricular ejection fraction, by estimation, is approximately 30  to 35%. The left ventricle has moderately decreased function. The left  ventricle demonstrates regional wall motion abnormalities (see scoring  diagram/findings for description).  The left ventricular internal cavity size was moderately dilated. There is  mild left ventricular hypertrophy. Left ventricular diastolic parameters  are consistent with Grade II diastolic dysfunction (pseudonormalization).  2. Right ventricular systolic function is normal. The right ventricular  size is normal. Tricuspid regurgitation signal is inadequate for assessing  PA pressure.   3. Left atrial size was mildly dilated.   4. Right atrial size was mildly dilated.   5. The mitral valve is grossly normal. Mild mitral valve regurgitation.   6. The aortic valve is tricuspid. Aortic valve regurgitation is not  visualized. There is a rounded echodensity seen at the aortic valve  coaptation  point in the parasternal long axis view only. Although cannot  exclude vegetation, this could be visualized   calcified left coronary leaflet tip based on other views. If endocarditis  is suspected clinically, would suggest TEE.   7. The inferior vena cava is dilated in size with >50% respiratory  variability, suggesting right atrial pressure of 8 mmHg.    Assessment and Plan  1. Chronic systolic HF - several year history of chronic systolic HF, ICM - medical therapy limited. Bradycardia on beta blockers now off. Significant renal dysfunction limits ACE/ARB/ARNI consideration. Renal did recently add aldactone. . Not a candidate for farxiga/jardiance given GFR <20 - no evidence of volume overload, home weights are stable. Continue current meds       2. Aflutter - not on av nodal agents due to prior bradycardia -CrCl has declined <50, will lower xarelto to 72m daily.      3. HTN - at goal, continue current meds     4.CAD - no recent symptoms, continue current meds     JArnoldo Lenis M.D.

## 2022-03-21 NOTE — Patient Instructions (Signed)
Medication Instructions:  Decrease Xarelto to '15mg'$  daily Continue all other medications.    Labwork: none  Testing/Procedures: none  Follow-Up: 4 months   Any Other Special Instructions Will Be Listed Below (If Applicable).  If you need a refill on your cardiac medications before your next appointment, please call your pharmacy.

## 2022-03-26 LAB — POCT HEMOGLOBIN-HEMACUE: Hemoglobin: 10.9 g/dL — ABNORMAL LOW (ref 13.0–17.0)

## 2022-04-01 DIAGNOSIS — D631 Anemia in chronic kidney disease: Secondary | ICD-10-CM | POA: Insufficient documentation

## 2022-04-02 ENCOUNTER — Encounter (HOSPITAL_COMMUNITY)
Admission: RE | Admit: 2022-04-02 | Discharge: 2022-04-02 | Disposition: A | Discharge status: Home / Self Care | Payer: BC Managed Care – PPO | Source: Ambulatory Visit | Attending: Nephrology | Admitting: Nephrology

## 2022-04-02 VITALS — BP 146/68 | HR 41 | Temp 97.5°F | Resp 18

## 2022-04-02 DIAGNOSIS — N184 Chronic kidney disease, stage 4 (severe): Secondary | ICD-10-CM | POA: Diagnosis present

## 2022-04-02 DIAGNOSIS — N1831 Chronic kidney disease, stage 3a: Secondary | ICD-10-CM | POA: Insufficient documentation

## 2022-04-02 DIAGNOSIS — D631 Anemia in chronic kidney disease: Secondary | ICD-10-CM

## 2022-04-02 MED ORDER — EPOETIN ALFA-EPBX 10000 UNIT/ML IJ SOLN: 10000.0000 [IU] | Freq: Once | INTRAMUSCULAR | Status: DC

## 2022-04-02 NOTE — Progress Notes (Signed)
Diagnosis: Anemia in Chronic Kidney Disease  Provider:  Ulice Bold MD    Post Care: Observation period completed  Patient Hgb was 10.7  did not require injection At this time  Discharge: Condition: Good, Destination: Home . AVS provided to patient.   Performed by:  Oren Beckmann, RN

## 2022-04-03 ENCOUNTER — Encounter (HOSPITAL_COMMUNITY)
Admission: RE | Admit: 2022-04-03 | Discharge: 2022-04-03 | Disposition: A | Discharge status: Home / Self Care | Payer: BC Managed Care – PPO | Source: Ambulatory Visit | Attending: Nephrology | Admitting: Nephrology

## 2022-04-03 DIAGNOSIS — N1831 Chronic kidney disease, stage 3a: Secondary | ICD-10-CM

## 2022-04-03 LAB — POCT HEMOGLOBIN-HEMACUE: Hemoglobin: 10.7 g/dL — ABNORMAL LOW (ref 13.0–17.0)

## 2022-04-16 ENCOUNTER — Encounter (HOSPITAL_COMMUNITY)
Admission: RE | Admit: 2022-04-16 | Discharge: 2022-04-16 | Disposition: A | Discharge status: Home / Self Care | Payer: BC Managed Care – PPO | Source: Ambulatory Visit | Attending: Nephrology | Admitting: Nephrology

## 2022-04-16 VITALS — BP 135/63 | HR 60 | Temp 98.0°F | Resp 18

## 2022-04-16 DIAGNOSIS — D631 Anemia in chronic kidney disease: Secondary | ICD-10-CM

## 2022-04-16 DIAGNOSIS — N184 Chronic kidney disease, stage 4 (severe): Secondary | ICD-10-CM | POA: Diagnosis not present

## 2022-04-16 LAB — POCT HEMOGLOBIN-HEMACUE: Hemoglobin: 10.2 g/dL — ABNORMAL LOW (ref 13.0–17.0)

## 2022-04-16 MED ORDER — EPOETIN ALFA-EPBX 10000 UNIT/ML IJ SOLN: 10000.0000 [IU] | Freq: Once | INTRAMUSCULAR | Status: DC

## 2022-04-16 NOTE — Progress Notes (Signed)
Hgb 10.2 - Retacrit not needed today

## 2022-04-17 ENCOUNTER — Encounter (HOSPITAL_COMMUNITY): Payer: BC Managed Care – PPO

## 2022-04-30 ENCOUNTER — Encounter (HOSPITAL_COMMUNITY)
Admission: RE | Admit: 2022-04-30 | Discharge: 2022-04-30 | Disposition: A | Discharge status: Home / Self Care | Payer: BC Managed Care – PPO | Source: Ambulatory Visit | Attending: Nephrology | Admitting: Nephrology

## 2022-04-30 VITALS — BP 139/71 | HR 57 | Temp 97.6°F | Resp 20

## 2022-04-30 DIAGNOSIS — N184 Chronic kidney disease, stage 4 (severe): Secondary | ICD-10-CM | POA: Diagnosis not present

## 2022-04-30 DIAGNOSIS — D631 Anemia in chronic kidney disease: Secondary | ICD-10-CM

## 2022-04-30 LAB — POCT HEMOGLOBIN-HEMACUE: Hemoglobin: 10.8 g/dL — ABNORMAL LOW (ref 13.0–17.0)

## 2022-04-30 MED ORDER — EPOETIN ALFA-EPBX 10000 UNIT/ML IJ SOLN: 10000.0000 [IU] | Freq: Once | INTRAMUSCULAR | Status: DC

## 2022-04-30 NOTE — Addendum Note (Signed)
Encounter addended by: Lilly Cove, RPH on: 04/30/2022 10:58 AM  Actions taken: Order list changed

## 2022-04-30 NOTE — Progress Notes (Signed)
Retacrit injection not given today, HGB 10.8

## 2022-05-01 ENCOUNTER — Encounter (HOSPITAL_COMMUNITY): Payer: BC Managed Care – PPO

## 2022-05-14 ENCOUNTER — Encounter (HOSPITAL_COMMUNITY)
Admission: RE | Admit: 2022-05-14 | Discharge: 2022-05-14 | Disposition: A | Discharge status: Home / Self Care | Payer: BC Managed Care – PPO | Source: Ambulatory Visit | Attending: Nephrology | Admitting: Nephrology

## 2022-05-14 VITALS — BP 140/71 | HR 53 | Temp 98.2°F | Resp 20

## 2022-05-14 DIAGNOSIS — N184 Chronic kidney disease, stage 4 (severe): Secondary | ICD-10-CM | POA: Diagnosis present

## 2022-05-14 DIAGNOSIS — D631 Anemia in chronic kidney disease: Secondary | ICD-10-CM | POA: Insufficient documentation

## 2022-05-14 LAB — POCT HEMOGLOBIN-HEMACUE: Hemoglobin: 10.6 g/dL — ABNORMAL LOW (ref 13.0–17.0)

## 2022-05-14 MED ORDER — EPOETIN ALFA-EPBX 10000 UNIT/ML IJ SOLN: 10000.0000 [IU] | Freq: Once | INTRAMUSCULAR | Status: DC

## 2022-05-14 NOTE — Progress Notes (Signed)
Hgb 10.6 - injection not needed today

## 2022-05-28 ENCOUNTER — Encounter (HOSPITAL_COMMUNITY)
Admission: RE | Admit: 2022-05-28 | Discharge: 2022-05-28 | Disposition: A | Discharge status: Home / Self Care | Payer: BC Managed Care – PPO | Source: Ambulatory Visit | Attending: Nephrology | Admitting: Nephrology

## 2022-05-28 VITALS — BP 145/85 | HR 60 | Temp 97.9°F | Resp 16

## 2022-05-28 DIAGNOSIS — D631 Anemia in chronic kidney disease: Secondary | ICD-10-CM

## 2022-05-28 DIAGNOSIS — N184 Chronic kidney disease, stage 4 (severe): Secondary | ICD-10-CM | POA: Diagnosis not present

## 2022-05-28 LAB — POCT HEMOGLOBIN-HEMACUE: Hemoglobin: 10.5 g/dL — ABNORMAL LOW (ref 13.0–17.0)

## 2022-05-28 MED ORDER — EPOETIN ALFA-EPBX 10000 UNIT/ML IJ SOLN: 10000.0000 [IU] | Freq: Once | INTRAMUSCULAR | Status: DC

## 2022-05-28 NOTE — Progress Notes (Signed)
Hgb 10.5- Injection not needed today.

## 2022-05-28 NOTE — Addendum Note (Signed)
Encounter addended by: Lilly Cove, RPH on: 05/28/2022 9:58 AM  Actions taken: Order list changed

## 2022-06-04 ENCOUNTER — Encounter: Payer: Self-pay | Admitting: Vascular Surgery

## 2022-06-04 ENCOUNTER — Ambulatory Visit: Payer: BC Managed Care – PPO | Admitting: Vascular Surgery

## 2022-06-04 VITALS — BP 159/78 | HR 61 | Temp 97.8°F | Ht 74.0 in | Wt 365.2 lb

## 2022-06-04 DIAGNOSIS — N184 Chronic kidney disease, stage 4 (severe): Secondary | ICD-10-CM

## 2022-06-04 NOTE — H&P (View-Only) (Signed)
[facility] [place]  Patient name: [name] MRN: [id number] DOB: 1960/11/06 Sex: male  REASON FOR VISIT: Discuss options for hemodialysis access  HPI: [name] is a 62 y.o. male here today to discuss actions options for hemodialysis access.  He is here today with his wife.  He is well-known to me from prior left radiocephalic fistula creation on 01/21/2022.  At my last office visit with him he did have good Janey Petron maturation.  He subsequently has thrombosis fistula and is here today for further discussion.  He has not progressed to the need for hemodialysis.  Past Medical History:  Diagnosis Date   Abscess 04/2017   Scrotal   Anemia    Arteriosclerotic cardiovascular disease (ASCVD)    MI-2000s; stent to the proximal LAD and diagonal in 2001; stress nuclear in 2008-impaired exercise capacity, left ventricular dilatation, moderately to severely depressed EF, apical, inferior and anteroseptal scar   Arthritis    Atrial flutter (HCC)    Bence-Jones proteinuria 05/05/2011   Cellulitis of leg    both legs   Chronic diarrhea    Chronic kidney disease, stage 3, mod decreased GFR (HCC)    Creatinine of 1.84 in 06/2011 and 1.5 in 07/2011   Diabetes mellitus    Insulin   Dysrhythmia    AFlutter   GERD (gastroesophageal reflux disease)    Gout    Hyperlipidemia    Hypertension    Injection site reaction    Multiple myeloma 07/01/2011   Myocardial infarction ([place]) 2000   Obesity    Pedal edema    Venous insufficiency   Sleep apnea    uses cpap   Ulcer     Family History  Problem Relation Age of Onset   Heart disease Mother    Cancer Mother    Diabetes Father    Arthritis Other    Anesthesia problems Neg Hx    Hypotension Neg Hx    Malignant hyperthermia Neg Hx    Pseudochol deficiency Neg Hx     SOCIAL HISTORY: Social History   Tobacco Use   Smoking status: Former    Packs/day: 0.25    Years:  1.00    Total pack years: 0.25    Types: Cigarettes, Cigars    Quit date: 05/17/2001    Years since quitting: 21.0    Passive exposure: Never   Smokeless tobacco: Never  Substance Use Topics   Alcohol use: No    Alcohol/week: 0.0 standard drinks of alcohol    Allergies  Allergen Reactions   Beef-Derived Products Diarrhea    Gout   Pork-Derived Products Diarrhea    Gout    Other Dermatitis    Band aids NO ADHESIVES      Current Outpatient Medications  Medication Sig Dispense Refill   acetaminophen (TYLENOL) 500 MG tablet Take 1,000 mg by mouth every 6 (six) hours as needed for moderate pain.     acyclovir (ZOVIRAX) 400 MG tablet Take 1 tablet (400 mg total) by mouth every morning. 30 tablet 5   allopurinol (ZYLOPRIM) 300 MG tablet TAKE ONE TABLET BY MOUTH ONCE DAILY. 30 tablet 2   atorvastatin (LIPITOR) 80 MG tablet TAKE ONE TABLET BY MOUTH AT BEDTIME. 90 tablet 3   Blood Glucose Monitoring Suppl (ACCU-CHEK GUIDE) w/Device KIT USE TO CHECK BLOOD SUGARSITWICE DAILY.     calcitRIOL (ROCALTROL) 0.25 MCG capsule Take 0.25 mcg by mouth 3 (three) times a week. Monday, Wednesday and Friday  Calcium Carb-Cholecalciferol (CALCIUM 600/VITAMIN D PO) Take 1 tablet by mouth in the morning and at bedtime.     cholecalciferol (VITAMIN D3) 25 MCG (1000 UNIT) tablet Take 1,000 Units by mouth daily.     CONTOUR NEXT TEST test strip      CREON 36000-114000 units CPEP capsule Take 36,000 Units by mouth 2 (two) times daily with a meal.     dicyclomine (BENTYL) 20 MG tablet TAKE 1 TABLET BY MOUTH BEFORE MEALS 3 TIMES DAILY. 90 tablet 2   Eluxadoline 100 MG TABS Take 100 mg by mouth 2 (two) times daily with a meal.     ferrous sulfate 325 (65 FE) MG EC tablet Take 325 mg by mouth every other day.     gabapentin (NEURONTIN) 600 MG tablet TAKE 1 TABLET BY MOUTH ONCE A DAY. (Patient taking differently: Take 300-600 mg by mouth See admin instructions. 600 mg in the morning, 300 mg at bedtime) 30 tablet  0   hydrALAZINE (APRESOLINE) 100 MG tablet TAKE 1 TABLET BY MOUTH THREE TIMES A DAY. 270 tablet 2   insulin aspart (NOVOLOG) 100 UNIT/ML injection Inject 1-3 Units into the skin 2 (two) times daily as needed for high blood sugar. Sliding scale over 150= 1 unit, 200= 2 units,250= 3 units     insulin detemir (LEVEMIR) 100 UNIT/ML injection Inject 0.1 mLs (10 Units total) into the skin every morning. 10 mL 11   INSULIN SYRINGE .5CC/29G 29G X 1/2" 0.5 ML MISC      isosorbide mononitrate (IMDUR) 30 MG 24 hr tablet Take 30 mg by mouth daily.     loratadine (CLARITIN) 10 MG tablet Take 10 mg by mouth every morning.     Magnesium Cl-Calcium Carbonate (SLOW-MAG PO) Take 1 tablet by mouth every morning.     Microlet Lancets MISC      Multiple Vitamins-Minerals (MULTIVITAMINS THER. W/MINERALS) TABS Take 1 tablet by mouth daily.     nitroGLYCERIN (NITROSTAT) 0.4 MG SL tablet DISSOLVE 1 TABLET UNDER TONGUE EVERY 5 MINUTES UP TO 15 MIN FOR CHESTPAIN. IF NO RELIEF CALL 911. (Patient taking differently: Place 0.4 mg under the tongue every 5 (five) minutes as needed for chest pain.) 25 tablet 0   pantoprazole (PROTONIX) 40 MG tablet Take 40 mg by mouth daily.     Rivaroxaban (XARELTO) 15 MG TABS tablet Take 1 tablet (15 mg total) by mouth daily with supper. 90 tablet 3   spironolactone (ALDACTONE) 25 MG tablet Take 25 mg by mouth daily.     SURE COMFORT PEN NEEDLES 29G X 12.7MM MISC USE AS DIRECTED FOURCTIMES DAILY.     torsemide (DEMADEX) 20 MG tablet Take 1-2 tablets (20-40 mg total) by mouth See admin instructions. 40 mg in the morning, 20 mg in the evening 135 tablet 3   oxyCODONE-acetaminophen (PERCOCET) 5-325 MG tablet Take 1 tablet by mouth every 6 (six) hours as needed for severe pain. (Patient not taking: Reported on 06/04/2022) 8 tablet 0   No current facility-administered medications for this visit.   Facility-Administered Medications Ordered in Other Visits  Medication Dose Route Frequency Provider  Last Rate Last Admin   sodium chloride 0.9 % injection 10 mL  10 mL Intravenous Once Farrel Gobble, MD        REVIEW OF SYSTEMS:  _0  denotes positive finding, _1  denotes negative finding Cardiac  Comments:  Chest pain or chest pressure:    Shortness of breath upon exertion:    Short of breath when  lying flat:    Irregular heart rhythm:        Vascular    Pain in calf, thigh, or hip brought on by ambulation:    Pain in feet at night that wakes you up from your sleep:     Blood clot in your veins:    Leg swelling:           PHYSICAL EXAM: Vitals:   06/04/22 1432  BP: (!) 159/78  Pulse: 61  Temp: 97.8 F (36.6 C)  SpO2: 98%  Weight: (!) 365 lb 3.2 oz (165.7 kg)  Height: _0  (1.88 m)    GENERAL: The patient is a well-nourished male, in no acute distress. The vital signs are documented above. CARDIOVASCULAR: Left radiocephalic incision is well-healed.  There is no thrill or bruit.  He does have a good radial pulse distal to this and does have a normal right radial pulse PULMONARY: There is good air exchange  MUSCULOSKELETAL: There are no major deformities or cyanosis. NEUROLOGIC: No focal weakness or paresthesias are detected. SKIN: There are no ulcers or rashes noted. PSYCHIATRIC: The patient has a normal affect.  DATA:  I imaged his veins with SonoSite ultrasound.  This does confirm occlusion of his radiocephalic fistula near the radial anastomosis.  He does have a good caliber cephalic vein at the antecubital space and moderate size vein above this.  He does have notably deep course of the vein running deep to the skin and the fat.  MEDICAL ISSUES: I discussed options with the patient.  I explained again the benefits of the fistula versus graft.  I do feel that he is a candidate for a left upper arm AV fistula creation.  He has unfortunately had Lachell Rochette failure of his left radiocephalic fistula.  The antecubital vein appears to be somewhat larger partially related to  the patency of his radiocephalic fistula.  He understands and wishes to proceed outpatient at [facility].  We will plan this for 06/17/2022    [name], MD FACS [facility] [phone]  Note: Portions of this report may have been transcribed using voice recognition software.  Every effort has been made to ensure accuracy; however, inadvertent computerized transcription errors may still be present.

## 2022-06-04 NOTE — Progress Notes (Signed)
[facility] [place]  Patient name: [name] MRN: [id number] DOB: 1960/11/06 Sex: male  REASON FOR VISIT: Discuss options for hemodialysis access  HPI: [name] is a 62 y.o. male here today to discuss actions options for hemodialysis access.  He is here today with his wife.  He is well-known to me from prior left radiocephalic fistula creation on 01/21/2022.  At my last office visit with him he did have good  Wenzlick maturation.  He subsequently has thrombosis fistula and is here today for further discussion.  He has not progressed to the need for hemodialysis.  Past Medical History:  Diagnosis Date   Abscess 04/2017   Scrotal   Anemia    Arteriosclerotic cardiovascular disease (ASCVD)    MI-2000s; stent to the proximal LAD and diagonal in 2001; stress nuclear in 2008-impaired exercise capacity, left ventricular dilatation, moderately to severely depressed EF, apical, inferior and anteroseptal scar   Arthritis    Atrial flutter (HCC)    Bence-Jones proteinuria 05/05/2011   Cellulitis of leg    both legs   Chronic diarrhea    Chronic kidney disease, stage 3, mod decreased GFR (HCC)    Creatinine of 1.84 in 06/2011 and 1.5 in 07/2011   Diabetes mellitus    Insulin   Dysrhythmia    AFlutter   GERD (gastroesophageal reflux disease)    Gout    Hyperlipidemia    Hypertension    Injection site reaction    Multiple myeloma 07/01/2011   Myocardial infarction ([place]) 2000   Obesity    Pedal edema    Venous insufficiency   Sleep apnea    uses cpap   Ulcer     Family History  Problem Relation Age of Onset   Heart disease Mother    Cancer Mother    Diabetes Father    Arthritis Other    Anesthesia problems Neg Hx    Hypotension Neg Hx    Malignant hyperthermia Neg Hx    Pseudochol deficiency Neg Hx     SOCIAL HISTORY: Social History   Tobacco Use   Smoking status: Former    Packs/day: 0.25    Years:  1.00    Total pack years: 0.25    Types: Cigarettes, Cigars    Quit date: 05/17/2001    Years since quitting: 21.0    Passive exposure: Never   Smokeless tobacco: Never  Substance Use Topics   Alcohol use: No    Alcohol/week: 0.0 standard drinks of alcohol    Allergies  Allergen Reactions   Beef-Derived Products Diarrhea    Gout   Pork-Derived Products Diarrhea    Gout    Other Dermatitis    Band aids NO ADHESIVES      Current Outpatient Medications  Medication Sig Dispense Refill   acetaminophen (TYLENOL) 500 MG tablet Take 1,000 mg by mouth every 6 (six) hours as needed for moderate pain.     acyclovir (ZOVIRAX) 400 MG tablet Take 1 tablet (400 mg total) by mouth every morning. 30 tablet 5   allopurinol (ZYLOPRIM) 300 MG tablet TAKE ONE TABLET BY MOUTH ONCE DAILY. 30 tablet 2   atorvastatin (LIPITOR) 80 MG tablet TAKE ONE TABLET BY MOUTH AT BEDTIME. 90 tablet 3   Blood Glucose Monitoring Suppl (ACCU-CHEK GUIDE) w/Device KIT USE TO CHECK BLOOD SUGARSITWICE DAILY.     calcitRIOL (ROCALTROL) 0.25 MCG capsule Take 0.25 mcg by mouth 3 (three) times a week. Monday, Wednesday and Friday  Calcium Carb-Cholecalciferol (CALCIUM 600/VITAMIN D PO) Take 1 tablet by mouth in the morning and at bedtime.     cholecalciferol (VITAMIN D3) 25 MCG (1000 UNIT) tablet Take 1,000 Units by mouth daily.     CONTOUR NEXT TEST test strip      CREON 36000-114000 units CPEP capsule Take 36,000 Units by mouth 2 (two) times daily with a meal.     dicyclomine (BENTYL) 20 MG tablet TAKE 1 TABLET BY MOUTH BEFORE MEALS 3 TIMES DAILY. 90 tablet 2   Eluxadoline 100 MG TABS Take 100 mg by mouth 2 (two) times daily with a meal.     ferrous sulfate 325 (65 FE) MG EC tablet Take 325 mg by mouth every other day.     gabapentin (NEURONTIN) 600 MG tablet TAKE 1 TABLET BY MOUTH ONCE A DAY. (Patient taking differently: Take 300-600 mg by mouth See admin instructions. 600 mg in the morning, 300 mg at bedtime) 30 tablet  0   hydrALAZINE (APRESOLINE) 100 MG tablet TAKE 1 TABLET BY MOUTH THREE TIMES A DAY. 270 tablet 2   insulin aspart (NOVOLOG) 100 UNIT/ML injection Inject 1-3 Units into the skin 2 (two) times daily as needed for high blood sugar. Sliding scale over 150= 1 unit, 200= 2 units,250= 3 units     insulin detemir (LEVEMIR) 100 UNIT/ML injection Inject 0.1 mLs (10 Units total) into the skin every morning. 10 mL 11   INSULIN SYRINGE .5CC/29G 29G X 1/2" 0.5 ML MISC      isosorbide mononitrate (IMDUR) 30 MG 24 hr tablet Take 30 mg by mouth daily.     loratadine (CLARITIN) 10 MG tablet Take 10 mg by mouth every morning.     Magnesium Cl-Calcium Carbonate (SLOW-MAG PO) Take 1 tablet by mouth every morning.     Microlet Lancets MISC      Multiple Vitamins-Minerals (MULTIVITAMINS THER. W/MINERALS) TABS Take 1 tablet by mouth daily.     nitroGLYCERIN (NITROSTAT) 0.4 MG SL tablet DISSOLVE 1 TABLET UNDER TONGUE EVERY 5 MINUTES UP TO 15 MIN FOR CHESTPAIN. IF NO RELIEF CALL 911. (Patient taking differently: Place 0.4 mg under the tongue every 5 (five) minutes as needed for chest pain.) 25 tablet 0   pantoprazole (PROTONIX) 40 MG tablet Take 40 mg by mouth daily.     Rivaroxaban (XARELTO) 15 MG TABS tablet Take 1 tablet (15 mg total) by mouth daily with supper. 90 tablet 3   spironolactone (ALDACTONE) 25 MG tablet Take 25 mg by mouth daily.     SURE COMFORT PEN NEEDLES 29G X 12.7MM MISC USE AS DIRECTED FOURCTIMES DAILY.     torsemide (DEMADEX) 20 MG tablet Take 1-2 tablets (20-40 mg total) by mouth See admin instructions. 40 mg in the morning, 20 mg in the evening 135 tablet 3   oxyCODONE-acetaminophen (PERCOCET) 5-325 MG tablet Take 1 tablet by mouth every 6 (six) hours as needed for severe pain. (Patient not taking: Reported on 06/04/2022) 8 tablet 0   No current facility-administered medications for this visit.   Facility-Administered Medications Ordered in Other Visits  Medication Dose Route Frequency Provider  Last Rate Last Admin   sodium chloride 0.9 % injection 10 mL  10 mL Intravenous Once Farrel Gobble, MD        REVIEW OF SYSTEMS:  _0  denotes positive finding, _1  denotes negative finding Cardiac  Comments:  Chest pain or chest pressure:    Shortness of breath upon exertion:    Short of breath when  lying flat:    Irregular heart rhythm:        Vascular    Pain in calf, thigh, or hip brought on by ambulation:    Pain in feet at night that wakes you up from your sleep:     Blood clot in your veins:    Leg swelling:           PHYSICAL EXAM: Vitals:   06/04/22 1432  BP: (!) 159/78  Pulse: 61  Temp: 97.8 F (36.6 C)  SpO2: 98%  Weight: (!) 365 lb 3.2 oz (165.7 kg)  Height: _0  (1.88 m)    GENERAL: The patient is a well-nourished male, in no acute distress. The vital signs are documented above. CARDIOVASCULAR: Left radiocephalic incision is well-healed.  There is no thrill or bruit.  He does have a good radial pulse distal to this and does have a normal right radial pulse PULMONARY: There is good air exchange  MUSCULOSKELETAL: There are no major deformities or cyanosis. NEUROLOGIC: No focal weakness or paresthesias are detected. SKIN: There are no ulcers or rashes noted. PSYCHIATRIC: The patient has a normal affect.  DATA:  I imaged his veins with SonoSite ultrasound.  This does confirm occlusion of his radiocephalic fistula near the radial anastomosis.  He does have a good caliber cephalic vein at the antecubital space and moderate size vein above this.  He does have notably deep course of the vein running deep to the skin and the fat.  MEDICAL ISSUES: I discussed options with the patient.  I explained again the benefits of the fistula versus graft.  I do feel that he is a candidate for a left upper arm AV fistula creation.  He has unfortunately had Paiten Boies failure of his left radiocephalic fistula.  The antecubital vein appears to be somewhat larger partially related to  the patency of his radiocephalic fistula.  He understands and wishes to proceed outpatient at [facility].  We will plan this for 06/17/2022    [name], MD FACS [facility] [phone]  Note: Portions of this report may have been transcribed using voice recognition software.  Every effort has been made to ensure accuracy; however, inadvertent computerized transcription errors may still be present.

## 2022-06-05 ENCOUNTER — Other Ambulatory Visit: Payer: Self-pay

## 2022-06-05 DIAGNOSIS — N184 Chronic kidney disease, stage 4 (severe): Secondary | ICD-10-CM

## 2022-06-11 ENCOUNTER — Encounter (HOSPITAL_COMMUNITY)
Admission: RE | Admit: 2022-06-11 | Discharge: 2022-06-11 | Disposition: A | Discharge status: Home / Self Care | Payer: BC Managed Care – PPO | Source: Ambulatory Visit | Attending: Nephrology | Admitting: Nephrology

## 2022-06-11 VITALS — BP 145/78 | HR 60 | Temp 98.0°F | Resp 18

## 2022-06-11 DIAGNOSIS — N184 Chronic kidney disease, stage 4 (severe): Secondary | ICD-10-CM | POA: Insufficient documentation

## 2022-06-11 DIAGNOSIS — D631 Anemia in chronic kidney disease: Secondary | ICD-10-CM

## 2022-06-11 LAB — POCT HEMOGLOBIN-HEMACUE: Hemoglobin: 10.3 g/dL — ABNORMAL LOW (ref 13.0–17.0)

## 2022-06-11 MED ORDER — EPOETIN ALFA-EPBX 10000 UNIT/ML IJ SOLN: 10000.0000 [IU] | Freq: Once | INTRAMUSCULAR | Status: DC

## 2022-06-11 NOTE — Progress Notes (Signed)
Injection not given, Hgb 10.3

## 2022-06-13 ENCOUNTER — Encounter (HOSPITAL_COMMUNITY)
Admission: RE | Admit: 2022-06-13 | Discharge: 2022-06-13 | Disposition: A | Discharge status: Home / Self Care | Payer: BC Managed Care – PPO | Source: Ambulatory Visit | Attending: Vascular Surgery | Admitting: Vascular Surgery

## 2022-06-13 DIAGNOSIS — N1832 Chronic kidney disease, stage 3b: Secondary | ICD-10-CM

## 2022-06-17 ENCOUNTER — Ambulatory Visit (HOSPITAL_COMMUNITY): Payer: BC Managed Care – PPO | Admitting: Anesthesiology

## 2022-06-17 ENCOUNTER — Encounter (HOSPITAL_COMMUNITY)
Admission: RE | Disposition: A | Discharge status: Home / Self Care | Payer: Self-pay | Source: Home / Self Care | Attending: Vascular Surgery

## 2022-06-17 ENCOUNTER — Ambulatory Visit (HOSPITAL_COMMUNITY)
Admission: RE | Admit: 2022-06-17 | Discharge: 2022-06-17 | Disposition: A | Discharge status: Home / Self Care | Payer: BC Managed Care – PPO | Attending: Vascular Surgery | Admitting: Vascular Surgery

## 2022-06-17 DIAGNOSIS — E785 Hyperlipidemia, unspecified: Secondary | ICD-10-CM | POA: Diagnosis not present

## 2022-06-17 DIAGNOSIS — N186 End stage renal disease: Secondary | ICD-10-CM | POA: Diagnosis not present

## 2022-06-17 DIAGNOSIS — G709 Myoneural disorder, unspecified: Secondary | ICD-10-CM | POA: Insufficient documentation

## 2022-06-17 DIAGNOSIS — I4891 Unspecified atrial fibrillation: Secondary | ICD-10-CM | POA: Diagnosis not present

## 2022-06-17 DIAGNOSIS — N183 Chronic kidney disease, stage 3 unspecified: Secondary | ICD-10-CM

## 2022-06-17 DIAGNOSIS — I2510 Atherosclerotic heart disease of native coronary artery without angina pectoris: Secondary | ICD-10-CM | POA: Diagnosis not present

## 2022-06-17 DIAGNOSIS — I132 Hypertensive heart and chronic kidney disease with heart failure and with stage 5 chronic kidney disease, or end stage renal disease: Secondary | ICD-10-CM | POA: Diagnosis not present

## 2022-06-17 DIAGNOSIS — I340 Nonrheumatic mitral (valve) insufficiency: Secondary | ICD-10-CM | POA: Insufficient documentation

## 2022-06-17 DIAGNOSIS — I509 Heart failure, unspecified: Secondary | ICD-10-CM | POA: Insufficient documentation

## 2022-06-17 DIAGNOSIS — Z87891 Personal history of nicotine dependence: Secondary | ICD-10-CM | POA: Diagnosis not present

## 2022-06-17 DIAGNOSIS — Z6841 Body Mass Index (BMI) 40.0 and over, adult: Secondary | ICD-10-CM | POA: Insufficient documentation

## 2022-06-17 DIAGNOSIS — N1832 Chronic kidney disease, stage 3b: Secondary | ICD-10-CM

## 2022-06-17 DIAGNOSIS — I252 Old myocardial infarction: Secondary | ICD-10-CM | POA: Insufficient documentation

## 2022-06-17 DIAGNOSIS — G4730 Sleep apnea, unspecified: Secondary | ICD-10-CM | POA: Insufficient documentation

## 2022-06-17 DIAGNOSIS — N185 Chronic kidney disease, stage 5: Secondary | ICD-10-CM

## 2022-06-17 DIAGNOSIS — E1122 Type 2 diabetes mellitus with diabetic chronic kidney disease: Secondary | ICD-10-CM | POA: Insufficient documentation

## 2022-06-17 DIAGNOSIS — K219 Gastro-esophageal reflux disease without esophagitis: Secondary | ICD-10-CM | POA: Diagnosis not present

## 2022-06-17 HISTORY — PX: AV FISTULA PLACEMENT: SHX1204

## 2022-06-17 LAB — BASIC METABOLIC PANEL
Anion gap: 13 (ref 5–15)
BUN: 96 mg/dL — ABNORMAL HIGH (ref 8–23)
CO2: 19 mmol/L — ABNORMAL LOW (ref 22–32)
Calcium: 8.4 mg/dL — ABNORMAL LOW (ref 8.9–10.3)
Chloride: 109 mmol/L (ref 98–111)
Creatinine, Ser: 4.12 mg/dL — ABNORMAL HIGH (ref 0.61–1.24)
GFR, Estimated: 15 mL/min — ABNORMAL LOW (ref >60)
Glucose, Bld: 154 mg/dL — ABNORMAL HIGH (ref 70–99)
Potassium: 4.1 mmol/L (ref 3.5–5.1)
Sodium: 141 mmol/L (ref 135–145)

## 2022-06-17 LAB — HEMOGLOBIN AND HEMATOCRIT, BLOOD
HCT: 33 % — ABNORMAL LOW (ref 39.0–52.0)
Hemoglobin: 10.5 g/dL — ABNORMAL LOW (ref 13.0–17.0)

## 2022-06-17 LAB — GLUCOSE, CAPILLARY: Glucose-Capillary: 148 mg/dL — ABNORMAL HIGH (ref 70–99)

## 2022-06-17 SURGERY — ARTERIOVENOUS (AV) FISTULA CREATION
Anesthesia: General | Site: Arm Upper | Laterality: Left

## 2022-06-17 MED ORDER — DEXTROSE 5 % IV SOLN
INTRAVENOUS | Status: DC | PRN
  Administered 2022-06-17: 3 g via INTRAVENOUS

## 2022-06-17 MED ORDER — LIDOCAINE HCL (CARDIAC) PF 100 MG/5ML IV SOSY
PREFILLED_SYRINGE | INTRAVENOUS | Status: DC | PRN
  Administered 2022-06-17: 40 mg via INTRAVENOUS

## 2022-06-17 MED ORDER — CHLORHEXIDINE GLUCONATE 4 % EX LIQD: 60.0000 mL | Freq: Once | CUTANEOUS | Status: DC

## 2022-06-17 MED ORDER — OXYCODONE HCL 5 MG/5ML PO SOLN: 5.0000 mg | Freq: Once | ORAL | Status: DC | PRN

## 2022-06-17 MED ORDER — OXYCODONE HCL 5 MG PO TABS: 5.0000 mg | ORAL_TABLET | Freq: Once | ORAL | Status: DC | PRN

## 2022-06-17 MED ORDER — MIDAZOLAM HCL 5 MG/5ML IJ SOLN
INTRAMUSCULAR | Status: DC | PRN
  Administered 2022-06-17: 1.5 mg via INTRAVENOUS
  Administered 2022-06-17: .5 mg via INTRAVENOUS

## 2022-06-17 MED ORDER — GLYCOPYRROLATE 0.2 MG/ML IJ SOLN
INTRAMUSCULAR | Status: DC | PRN
  Administered 2022-06-17: .2 mg via INTRAVENOUS

## 2022-06-17 MED ORDER — CHLORHEXIDINE GLUCONATE 0.12 % MT SOLN
15.0000 mL | Freq: Once | OROMUCOSAL | Status: AC
  Administered 2022-06-17: 15 mL via OROMUCOSAL

## 2022-06-17 MED ORDER — PHENYLEPHRINE HCL (PRESSORS) 10 MG/ML IV SOLN
INTRAVENOUS | Status: DC | PRN
  Administered 2022-06-17: 160 ug via INTRAVENOUS

## 2022-06-17 MED ORDER — ONDANSETRON HCL 4 MG/2ML IJ SOLN: 4.0000 mg | Freq: Once | INTRAMUSCULAR | Status: DC | PRN

## 2022-06-17 MED ORDER — LIDOCAINE-EPINEPHRINE 0.5 %-1:200000 IJ SOLN
INTRAMUSCULAR | Status: AC
  Filled 2022-06-17: qty 1

## 2022-06-17 MED ORDER — OXYCODONE-ACETAMINOPHEN 5-325 MG PO TABS: 1.0000 | ORAL_TABLET | Freq: Four times a day (QID) | ORAL | 0 refills | Status: AC | PRN

## 2022-06-17 MED ORDER — ONDANSETRON HCL 4 MG/2ML IJ SOLN
INTRAMUSCULAR | Status: DC | PRN
  Administered 2022-06-17: 4 mg via INTRAVENOUS

## 2022-06-17 MED ORDER — CEFAZOLIN IN SODIUM CHLORIDE 3-0.9 GM/100ML-% IV SOLN: 3.0000 g | INTRAVENOUS | Status: DC

## 2022-06-17 MED ORDER — SODIUM CHLORIDE 0.9 % IV SOLN: INTRAVENOUS | Status: DC | PRN

## 2022-06-17 MED ORDER — DEXMEDETOMIDINE HCL IN NACL 80 MCG/20ML IV SOLN
INTRAVENOUS | Status: DC | PRN
  Administered 2022-06-17 (×2): 8 ug via BUCCAL

## 2022-06-17 MED ORDER — LACTATED RINGERS IV SOLN: INTRAVENOUS | Status: DC

## 2022-06-17 MED ORDER — SODIUM CHLORIDE 0.9 % IV SOLN: INTRAVENOUS | Status: DC

## 2022-06-17 MED ORDER — PROPOFOL 500 MG/50ML IV EMUL
INTRAVENOUS | Status: DC | PRN
  Administered 2022-06-17: 20 mg via INTRAVENOUS
  Administered 2022-06-17: 30 mg via INTRAVENOUS
  Administered 2022-06-17: 50 ug/kg/min via INTRAVENOUS
  Administered 2022-06-17: 20 mg via INTRAVENOUS
  Administered 2022-06-17: 10 mg via INTRAVENOUS

## 2022-06-17 MED ORDER — ORAL CARE MOUTH RINSE: 15.0000 mL | Freq: Once | OROMUCOSAL | Status: AC

## 2022-06-17 MED ORDER — LIDOCAINE HCL (PF) 2 % IJ SOLN
INTRAMUSCULAR | Status: AC
  Filled 2022-06-17: qty 5

## 2022-06-17 MED ORDER — LIDOCAINE-EPINEPHRINE 0.5 %-1:200000 IJ SOLN
INTRAMUSCULAR | Status: DC | PRN
  Administered 2022-06-17: 7 mL

## 2022-06-17 MED ORDER — FENTANYL CITRATE (PF) 100 MCG/2ML IJ SOLN
INTRAMUSCULAR | Status: DC | PRN
  Administered 2022-06-17 (×4): 25 ug via INTRAVENOUS

## 2022-06-17 MED ORDER — CEFAZOLIN IN SODIUM CHLORIDE 3-0.9 GM/100ML-% IV SOLN
INTRAVENOUS | Status: AC
  Filled 2022-06-17: qty 100

## 2022-06-17 MED ORDER — MIDAZOLAM HCL 2 MG/2ML IJ SOLN
INTRAMUSCULAR | Status: AC
  Filled 2022-06-17: qty 2

## 2022-06-17 MED ORDER — FENTANYL CITRATE (PF) 100 MCG/2ML IJ SOLN
INTRAMUSCULAR | Status: AC
  Filled 2022-06-17: qty 2

## 2022-06-17 MED ORDER — 0.9 % SODIUM CHLORIDE (POUR BTL) OPTIME
TOPICAL | Status: DC | PRN
  Administered 2022-06-17: 1000 mL

## 2022-06-17 MED ORDER — HEPARIN 6000 UNIT IRRIGATION SOLUTION
Status: DC | PRN
  Administered 2022-06-17: 1

## 2022-06-17 MED ORDER — PROPOFOL 10 MG/ML IV BOLUS
INTRAVENOUS | Status: AC
  Filled 2022-06-17: qty 20

## 2022-06-17 MED ORDER — FENTANYL CITRATE PF 50 MCG/ML IJ SOSY: 25.0000 ug | PREFILLED_SYRINGE | INTRAMUSCULAR | Status: DC | PRN

## 2022-06-17 MED ORDER — ACETAMINOPHEN 10 MG/ML IV SOLN
INTRAVENOUS | Status: AC
  Filled 2022-06-17: qty 100

## 2022-06-17 MED ORDER — HEPARIN SODIUM (PORCINE) 1000 UNIT/ML IJ SOLN
INTRAMUSCULAR | Status: AC
  Filled 2022-06-17: qty 6

## 2022-06-17 MED ORDER — ACETAMINOPHEN 10 MG/ML IV SOLN
INTRAVENOUS | Status: DC | PRN
  Administered 2022-06-17: 1000 mg via INTRAVENOUS

## 2022-06-17 SURGICAL SUPPLY — 37 items
ADH SKN CLS APL DERMABOND .7 (GAUZE/BANDAGES/DRESSINGS) ×1
ARMBAND PINK RESTRICT EXTREMIT (MISCELLANEOUS) ×1 IMPLANT
BAG HAMPER (MISCELLANEOUS) ×1 IMPLANT
CANNULA VESSEL 3MM 2 BLNT TIP (CANNULA) ×1 IMPLANT
CLIP LIGATING EXTRA MED SLVR (CLIP) ×1 IMPLANT
CLIP LIGATING EXTRA SM BLUE (MISCELLANEOUS) ×1 IMPLANT
COVER LIGHT HANDLE STERIS (MISCELLANEOUS) ×2 IMPLANT
COVER MAYO STAND XLG (MISCELLANEOUS) ×1 IMPLANT
DECANTER SPIKE VIAL GLASS SM (MISCELLANEOUS) ×1 IMPLANT
DERMABOND ADVANCED .7 DNX12 (GAUZE/BANDAGES/DRESSINGS) ×1 IMPLANT
ELECT REM PT RETURN 9FT ADLT (ELECTROSURGICAL) ×1
ELECTRODE REM PT RTRN 9FT ADLT (ELECTROSURGICAL) ×1 IMPLANT
GAUZE SPONGE 4X4 12PLY STRL (GAUZE/BANDAGES/DRESSINGS) ×1 IMPLANT
GLOVE BIOGEL PI IND STRL 7.0 (GLOVE) ×2 IMPLANT
GLOVE SURG MICRO LTX SZ7.5 (GLOVE) ×1 IMPLANT
GOWN STRL REUS W/TWL LRG LVL3 (GOWN DISPOSABLE) ×3 IMPLANT
IV NS 500ML (IV SOLUTION) ×1
IV NS 500ML BAXH (IV SOLUTION) ×1 IMPLANT
KIT BLADEGUARD II DBL (SET/KITS/TRAYS/PACK) ×1 IMPLANT
KIT TURNOVER KIT A (KITS) ×1 IMPLANT
MANIFOLD NEPTUNE II (INSTRUMENTS) ×1 IMPLANT
MARKER SKIN DUAL TIP RULER LAB (MISCELLANEOUS) ×2 IMPLANT
NDL HYPO 18GX1.5 BLUNT FILL (NEEDLE) ×1 IMPLANT
NEEDLE HYPO 18GX1.5 BLUNT FILL (NEEDLE) ×1 IMPLANT
NS IRRIG 1000ML POUR BTL (IV SOLUTION) ×1 IMPLANT
PACK CV ACCESS (CUSTOM PROCEDURE TRAY) ×1 IMPLANT
PAD ARMBOARD 7.5X6 YLW CONV (MISCELLANEOUS) ×1 IMPLANT
SET BASIN LINEN APH (SET/KITS/TRAYS/PACK) ×1 IMPLANT
SOL PREP POV-IOD 4OZ 10% (MISCELLANEOUS) ×1 IMPLANT
SOL PREP PROV IODINE SCRUB 4OZ (MISCELLANEOUS) ×1 IMPLANT
SPONGE T-LAP 18X18 ~~LOC~~+RFID (SPONGE) ×1 IMPLANT
SUT PROLENE 6 0 CC (SUTURE) ×1 IMPLANT
SUT VIC AB 3-0 SH 27 (SUTURE) ×1
SUT VIC AB 3-0 SH 27X BRD (SUTURE) ×1 IMPLANT
SYR 10ML LL (SYRINGE) ×1 IMPLANT
SYR 50ML LL SCALE MARK (SYRINGE) ×1 IMPLANT
UNDERPAD 30X36 HEAVY ABSORB (UNDERPADS AND DIAPERS) ×1 IMPLANT

## 2022-06-17 NOTE — Discharge Instructions (Signed)
[facility]  Discharge Instructions  AV Fistula or Graft Surgery for Dialysis Access  Please refer to the following instructions for your post-procedure care. Your surgeon or physician assistant will discuss any changes with you.  Activity  You may drive the day following your surgery, if you are comfortable and no longer taking prescription pain medication. Resume full activity as the soreness in your incision resolves.  Bathing/Showering  You may shower after you go home. Keep your incision dry for 48 hours. Do not soak in a bathtub, hot tub, or swim until the incision heals completely. You may not shower if you have a hemodialysis catheter.  Incision Care  Clean your incision with mild soap and water after 48 hours. Pat the area dry with a clean towel. You do not need a bandage unless otherwise instructed. Do not apply any ointments or creams to your incision. You may have skin glue on your incision. Do not peel it off. It will come off on its own in about one week. Your arm may swell a bit after surgery. To reduce swelling use pillows to elevate your arm so it is above your heart. Your doctor will tell you if you need to lightly wrap your arm with an ACE bandage.  Diet  Resume your normal diet. There are not special food restrictions following this procedure. In order to heal from your surgery, it is CRITICAL to get adequate nutrition. Your body requires vitamins, minerals, and protein. Vegetables are the best source of vitamins and minerals. Vegetables also provide the perfect balance of protein. Processed food has little nutritional value, so try to avoid this.  Medications  Resume taking all of your medications. If your incision is causing pain, you may take over-the counter pain relievers such as acetaminophen (Tylenol). If you were prescribed a stronger pain medication, please be aware these medications can cause nausea and constipation. Prevent  nausea by taking the medication with a snack or meal. Avoid constipation by drinking plenty of fluids and eating foods with high amount of fiber, such as fruits, vegetables, and grains.  Do not take Tylenol if you are taking prescription pain medications.  Follow up Your surgeon may want to see you in the office following your access surgery. If so, this will be arranged at the time of your surgery.  Please call us immediately for any of the following conditions:  Increased pain, redness, drainage (pus) from your incision site Fever of 101 degrees or higher Severe or worsening pain at your incision site Hand pain or numbness.  Reduce your risk of vascular disease:  Stop smoking. If you would like help, call [facility] at 1-800-QUIT-NOW 559-197-3601) or [place] at [place] your cholesterol Maintain a desired weight Control your diabetes Keep your blood pressure down  Dialysis  It will take several weeks to several months for your new dialysis access to be ready for use. Your surgeon will determine when it is okay to use it. Your nephrologist will continue to direct your dialysis. You can continue to use your Permcath until your new access is ready for use.   06/17/2022 [name] [id number] [birth year]  Surgeon(s): Kinisha Soper, Arvilla Meres, MD  Procedure(s): LEFT ARM ARTERIOVENOUS (AV) FISTULA CREATION   May stick graft immediately   May stick graft on designated area only:    Do not stick fistula for 12 weeks    If you have any questions, please call the office at [phone].

## 2022-06-17 NOTE — Transfer of Care (Signed)
Immediate Anesthesia Transfer of Care Note  Patient: [name]  Procedure(s) Performed: LEFT ARM ARTERIOVENOUS (AV) FISTULA CREATION (Left: Arm Upper)  Patient Location: PACU  Anesthesia Type:MAC  Level of Consciousness: awake, alert , and oriented  Airway & Oxygen Therapy: Patient Spontanous Breathing and Patient connected to face mask oxygen  Post-op Assessment: Report given to RN and Post -op Vital signs reviewed and stable  Post vital signs: Reviewed and stable  Last Vitals:  Vitals Value Taken Time  BP 125/68 06/17/22 1045  Temp    Pulse 60 06/17/22 1046  Resp 22 06/17/22 1046  SpO2 100 % 06/17/22 1046  Vitals shown include unvalidated device data.  Last Pain:  Vitals:   06/17/22 0823  PainSc: 0-No pain         Complications: No notable events documented.

## 2022-06-17 NOTE — Anesthesia Preprocedure Evaluation (Signed)
Anesthesia Evaluation  Patient identified by MRN, date of birth, ID band Patient awake    Reviewed: Allergy & Precautions, H&P , NPO status , Patient's Chart, lab work & pertinent test results, reviewed documented beta blocker date and time   Airway Mallampati: II  TM Distance: >3 FB Neck ROM: full    Dental no notable dental hx.    Pulmonary sleep apnea , former smoker   Pulmonary exam normal breath sounds clear to auscultation       Cardiovascular Exercise Tolerance: Good hypertension, + CAD, + Past MI and +CHF  + dysrhythmias Atrial Fibrillation  Rhythm:regular Rate:Normal     Neuro/Psych  Neuromuscular disease  negative psych ROS   GI/Hepatic Neg liver ROS,GERD  Medicated,,  Endo/Other  diabetes, Poorly Controlled, Type 2  Morbid obesity  Renal/GU CRF and ESRFRenal disease  negative genitourinary   Musculoskeletal   Abdominal   Peds  Hematology  (+) Blood dyscrasia, anemia   Anesthesia Other Findings  1. Poor acoustic windows APical window is somewhat foreshortened. Severe  distal inferior , inferoseptal hypokinesis; apoap. Left ventricular  ejection fraction, by estimation, is 35 to 40%. The left ventricle has  moderately decreased function. The left  ventricular internal cavity size was severely dilated.   2. Right ventricular systolic function is normal. The right ventricular  size is normal. There is normal pulmonary artery systolic pressure.   3. The mitral valve is normal in structure. Mild mitral valve  regurgitation.   4. Echobright density along closeure line of Left coronary cusp 10 x 6  mm. . The aortic valve is tricuspid. Aortic valve regurgitation is not  visualized.   5. Aortic dilatation noted. There is mild dilatation of the ascending  aorta, measuring 41 mm.   6. The inferior vena cava is dilated in size with <50% respiratory  variability, suggesting right atrial pressure of 15 mmHg.    Reproductive/Obstetrics negative OB ROS                              Anesthesia Physical Anesthesia Plan  ASA: 3  Anesthesia Plan: General   Post-op Pain Management:    Induction:   PONV Risk Score and Plan: Ondansetron  Airway Management Planned:   Additional Equipment:   Intra-op Plan:   Post-operative Plan:   Informed Consent: I have reviewed the patients History and Physical, chart, labs and discussed the procedure including the risks, benefits and alternatives for the proposed anesthesia with the patient or authorized representative who has indicated his/her understanding and acceptance.     Dental Advisory Given  Plan Discussed with: CRNA  Anesthesia Plan Comments:          Anesthesia Quick Evaluation

## 2022-06-17 NOTE — Progress Notes (Signed)
Latest Reference Range & Units 06/17/22 50:41  BASIC METABOLIC PANEL  Rpt !  Sodium 135 - 145 mmol/L 141  Potassium 3.5 - 5.1 mmol/L 4.1  Chloride 98 - 111 mmol/L 109  CO2 22 - 32 mmol/L 19 (L)  Glucose 70 - 99 mg/dL 154 (H)  BUN 8 - 23 mg/dL 96 (H)  Creatinine 0.61 - 1.24 mg/dL 4.12 (H)  Calcium 8.9 - 10.3 mg/dL 8.4 (L)  Anion gap 5 - 15  13  GFR, Estimated >60 mL/min 15 (L)  Hemoglobin 13.0 - 17.0 g/dL 10.5 (L)  HCT 39.0 - 52.0 % 33.0 (L)  !: Data is abnormal (L): Data is abnormally low (H): Data is abnormally high Rpt: View report in Results Review for more information

## 2022-06-17 NOTE — Op Note (Signed)
    OPERATIVE REPORT  DATE OF SURGERY: 06/17/2022  PATIENT: [name], 62 y.o. male MRN: [id number]  DOB: 1961-05-01  PRE-OPERATIVE DIAGNOSIS: Chronic renal insufficiency  POST-OPERATIVE DIAGNOSIS:  Same  PROCEDURE: Left brachiocephalic AV fistula creation  SURGEON:  Curt Jews, M.D.  PHYSICIAN ASSISTANT: Fulton Mole, RNFA  The assistant was needed for exposure and to expedite the case  ANESTHESIA: MAC  EBL: per anesthesia record  Total I/O In: 500 [I.V.:500] Out: -   BLOOD ADMINISTERED: none  DRAINS: none  SPECIMEN: none  COUNTS CORRECT:  YES  PATIENT DISPOSITION:  PACU - hemodynamically stable  PROCEDURE DETAILS: The patient was taken operating placed supine position where the area of the left arm prepped draped you sterile fashion.  Using local anesthesia, incision was made transversely over the antecubital space and carried down to isolate the cephalic vein which was of large caliber and the brachial artery which was also of large caliber.  Tributary branches of the vein were ligated with 3-0 and 4-0 silk ties and divided.  The vein was ligated distally and divided and was brought into approximation with the brachial artery.  The vein was flushed with heparinized saline and reoccluded.  The artery was occluded proximally and distally with fistula clamps and was opened with an 11 blade and sent longitudinally with Potts scissors.  Small arteriotomy was created to reduce risk of steal.  The vein was cut to the appropriate length and was spatulated and sewn end-to-side to the artery with a running 6-0 Prolene suture.  Clamps were removed and excellent thrill was noted.  The patient did maintain a left radial pulse.  The wound was irrigated with saline.  Hemostasis was obtained electrocautery.  The wound was closed with 3-0 Vicryl in the subcutaneous and subcuticular tissue.  Sterile dressing was applied and the patient was transferred to the recovery room in stable  condition   [name], M.D., [facility] 06/17/2022 10:39 AM  Note: Portions of this report may have been transcribed using voice recognition software.  Every effort has been made to ensure accuracy; however, inadvertent computerized transcription errors may still be present.

## 2022-06-17 NOTE — Interval H&P Note (Signed)
History and Physical Interval Note:  06/17/2022 8:41 AM  [name]  has presented today for surgery, with the diagnosis of CKD IV.  The various methods of treatment have been discussed with the patient and family. After consideration of risks, benefits and other options for treatment, the patient has consented to  Procedure(s): LEFT ARM ARTERIOVENOUS (AV) FISTULA CREATION (Left) as a surgical intervention.  The patient's history has been reviewed, patient examined, no change in status, stable for surgery.  I have reviewed the patient's chart and labs.  Questions were answered to the patient's satisfaction.     Curt Jews

## 2022-06-18 ENCOUNTER — Other Ambulatory Visit: Payer: Self-pay

## 2022-06-18 NOTE — Anesthesia Postprocedure Evaluation (Signed)
Anesthesia Post Note  Patient: [name]  Procedure(s) Performed: LEFT ARM ARTERIOVENOUS (AV) FISTULA CREATION (Left: Arm Upper)  Patient location during evaluation: Phase II Anesthesia Type: General Level of consciousness: awake Pain management: pain level controlled Vital Signs Assessment: post-procedure vital signs reviewed and stable Respiratory status: spontaneous breathing and respiratory function stable Cardiovascular status: blood pressure returned to baseline and stable Postop Assessment: no headache and no apparent nausea or vomiting Anesthetic complications: no Comments: Late entry   No notable events documented.   Last Vitals:  Vitals:   06/17/22 1115 06/17/22 1137  BP: 119/64 125/71  Pulse: (!) 55 (!) 55  Resp: 20 20  Temp:  36.7 C  SpO2: 94% 99%    Last Pain:  Vitals:   06/17/22 1137  TempSrc: Oral  PainSc: 0-No pain                 Louann Sjogren

## 2022-06-20 ENCOUNTER — Telehealth: Payer: Self-pay | Admitting: Vascular Surgery

## 2022-06-20 NOTE — Telephone Encounter (Signed)
-----  Message from [name], MD sent at 06/17/2022 10:41 AM EST -----  Left brachiocephalic AV fistula creation.  I need to see him in the office in 4 to 6 weeks with duplex

## 2022-06-23 ENCOUNTER — Encounter (HOSPITAL_COMMUNITY): Payer: Self-pay | Admitting: Vascular Surgery

## 2022-06-25 ENCOUNTER — Telehealth: Payer: Self-pay

## 2022-06-25 ENCOUNTER — Encounter (HOSPITAL_COMMUNITY)
Admission: RE | Admit: 2022-06-25 | Discharge: 2022-06-25 | Disposition: A | Discharge status: Home / Self Care | Payer: BC Managed Care – PPO | Source: Ambulatory Visit | Attending: Nephrology | Admitting: Nephrology

## 2022-06-25 ENCOUNTER — Telehealth: Payer: Self-pay | Admitting: Cardiology

## 2022-06-25 VITALS — BP 138/80 | HR 68 | Temp 98.1°F | Resp 20

## 2022-06-25 DIAGNOSIS — D631 Anemia in chronic kidney disease: Secondary | ICD-10-CM

## 2022-06-25 DIAGNOSIS — N184 Chronic kidney disease, stage 4 (severe): Secondary | ICD-10-CM | POA: Diagnosis not present

## 2022-06-25 LAB — POCT HEMOGLOBIN-HEMACUE: Hemoglobin: 9.7 g/dL — ABNORMAL LOW (ref 13.0–17.0)

## 2022-06-25 MED ORDER — EPOETIN ALFA-EPBX 10000 UNIT/ML IJ SOLN
10000.0000 [IU] | Freq: Once | INTRAMUSCULAR | Status: AC
  Administered 2022-06-25: 10000 [IU] via SUBCUTANEOUS
  Filled 2022-06-25: qty 1

## 2022-06-25 NOTE — Telephone Encounter (Signed)
, °  Pt c/o swelling: STAT is pt has developed SOB within 24 hours  If swelling, where is the swelling located? eet and legs  How much weight have you gained and in what time span? 12 lbs in a month   Have you gained 3 pounds in a day or 5 pounds in a week? no  Do you have a log of your daily weights (if so, list)?   Are you currently taking a fluid pill? yes  Are you currently SOB? Off and on  Have you traveled recently? no

## 2022-06-25 NOTE — Telephone Encounter (Signed)
Reports weight gain, some swelling in legs and feet, intermittent SOB. Does not sound SOB on phone. Able to make full sentences without pauses. Denies chest pain or dizziness. Reports cramping in hands and arms. Weighs daily. Today weighed 370 lbs, yesterday 372 lbs, Monday 374 lbs, Sunday 374 lbs. Reports weighing 378 one day last week and unsure exact day but thinks it was on last Wednesday. Reports baseline weight is 366 lbs. Medications  reviewed. Has been taking torsemide 40 mg in the morning and 40 mg in the evening. Wife increased torsemide dose 2 weeks ago to 40 mg in the morning and 40 mg in the evening. Advised that message would be sent to provider and he would be contacted tomorrow with a response. Advised if he develops worsening symptoms, to go to the ED for an evaluation. Verbalized understanding.

## 2022-06-25 NOTE — Progress Notes (Signed)
Diagnosis: Anemia in Chronic Kidney Disease  Provider:  Manpreet Bhutani MD  Procedure: Injection  Retacrit (epoetin alfa-epbx), Dose: 10000 Units, Site: subcutaneous, Number of injections: 1  Hgb. 9.7  Post Care: Observation period completed  Discharge: Condition: Good, Destination: Home . AVS provided to patient.   Performed by:  Oren Beckmann, RN

## 2022-06-25 NOTE — Telephone Encounter (Signed)
Pt called with concerns of incision post-op. Pt stated that he noticed some moisture at his incisional site on Monday night. He describes the drainage as clear and odorless. He has some slight swelling in his arm, which is improving. He notes some tenderness, but it is also improving daily. He denies any open areas on the incision. He denies any other infection s&s. Instructed pt to wash the area with mild soap and water, pat dry, cover, and change daily or more often if needed to keep dry. He is to monitor for any changes and call back.   Confirmed this info with Dr. Donnetta Hutching, called pt to update him. Confirmed understanding.

## 2022-06-26 NOTE — Telephone Encounter (Signed)
If taking torsemide '40mg'$  bid without improvement then increase to '60mg'$  in AM and '40mg'$  until Monday and then update [place] again please   Zandra Abts MD

## 2022-06-26 NOTE — Telephone Encounter (Signed)
Patient informed and verbalized understanding of plan. 

## 2022-07-02 ENCOUNTER — Encounter (INDEPENDENT_AMBULATORY_CARE_PROVIDER_SITE_OTHER): Payer: BLUE CROSS/BLUE SHIELD | Admitting: Family Medicine

## 2022-07-02 DIAGNOSIS — Z0289 Encounter for other administrative examinations: Secondary | ICD-10-CM

## 2022-07-03 ENCOUNTER — Other Ambulatory Visit: Payer: Self-pay | Admitting: *Deleted

## 2022-07-03 DIAGNOSIS — C9000 Multiple myeloma not having achieved remission: Secondary | ICD-10-CM

## 2022-07-07 ENCOUNTER — Ambulatory Visit: Payer: BC Managed Care – PPO | Admitting: Hematology

## 2022-07-08 ENCOUNTER — Telehealth: Payer: Self-pay | Admitting: Cardiology

## 2022-07-08 NOTE — Telephone Encounter (Signed)
Patient calling to give his weight. He says he is 368 lbs and he is on 60 mg of torsemide am and pm.

## 2022-07-08 NOTE — Telephone Encounter (Signed)
MyChart message sent to pt for recent weights.

## 2022-07-08 NOTE — Telephone Encounter (Signed)
Advised to send readings through mychart Verbalized and agreed to plan

## 2022-07-09 ENCOUNTER — Encounter (HOSPITAL_COMMUNITY)
Admission: RE | Admit: 2022-07-09 | Discharge: 2022-07-09 | Disposition: A | Discharge status: Home / Self Care | Payer: BC Managed Care – PPO | Source: Ambulatory Visit | Attending: Nephrology | Admitting: Nephrology

## 2022-07-09 ENCOUNTER — Other Ambulatory Visit: Payer: Self-pay

## 2022-07-09 VITALS — BP 148/70 | HR 70 | Temp 98.4°F | Resp 16

## 2022-07-09 DIAGNOSIS — D631 Anemia in chronic kidney disease: Secondary | ICD-10-CM | POA: Diagnosis present

## 2022-07-09 DIAGNOSIS — I5022 Chronic systolic (congestive) heart failure: Secondary | ICD-10-CM | POA: Diagnosis present

## 2022-07-09 DIAGNOSIS — I10 Essential (primary) hypertension: Secondary | ICD-10-CM | POA: Diagnosis present

## 2022-07-09 DIAGNOSIS — Z79899 Other long term (current) drug therapy: Secondary | ICD-10-CM

## 2022-07-09 DIAGNOSIS — I2510 Atherosclerotic heart disease of native coronary artery without angina pectoris: Secondary | ICD-10-CM | POA: Diagnosis present

## 2022-07-09 DIAGNOSIS — I4892 Unspecified atrial flutter: Secondary | ICD-10-CM | POA: Diagnosis present

## 2022-07-09 DIAGNOSIS — N184 Chronic kidney disease, stage 4 (severe): Secondary | ICD-10-CM | POA: Insufficient documentation

## 2022-07-09 LAB — POCT HEMOGLOBIN-HEMACUE: Hemoglobin: 11.8 g/dL — ABNORMAL LOW (ref 13.0–17.0)

## 2022-07-09 MED ORDER — EPOETIN ALFA-EPBX 10000 UNIT/ML IJ SOLN: 10000.0000 [IU] | Freq: Once | INTRAMUSCULAR | Status: DC

## 2022-07-09 MED ORDER — TORSEMIDE 20 MG PO TABS: ORAL_TABLET | ORAL | 3 refills | Status: DC

## 2022-07-09 NOTE — Telephone Encounter (Signed)
Please get bmet and magnesium. If cramping can skip evening diuretic today and tomorrow resume 3 tables in the AM and lower to 1 tablet in PM  Zandra Abts MD

## 2022-07-09 NOTE — Progress Notes (Signed)
Diagnosis: Anemia in Chronic Kidney Disease  Provider:  Manpreet Bhutani MD  Procedure: Injection  Retacrit (epoetin alfa-epbx), Dose: 10000 Units, Site: subcutaneous, Number of injections: 0  Hgb 11.8. Injection not administered.  Post Care: Patient declined observation  Discharge: Condition: Good, Destination: Home . AVS Provided  Performed by:  Binnie Kand, RN

## 2022-07-10 ENCOUNTER — Inpatient Hospital Stay: Payer: BC Managed Care – PPO | Attending: Hematology | Admitting: Hematology

## 2022-07-10 VITALS — BP 127/71 | HR 56 | Temp 98.2°F | Resp 16

## 2022-07-10 DIAGNOSIS — E1122 Type 2 diabetes mellitus with diabetic chronic kidney disease: Secondary | ICD-10-CM | POA: Insufficient documentation

## 2022-07-10 DIAGNOSIS — D649 Anemia, unspecified: Secondary | ICD-10-CM | POA: Diagnosis not present

## 2022-07-10 DIAGNOSIS — C9000 Multiple myeloma not having achieved remission: Secondary | ICD-10-CM | POA: Diagnosis present

## 2022-07-10 DIAGNOSIS — Z87891 Personal history of nicotine dependence: Secondary | ICD-10-CM | POA: Insufficient documentation

## 2022-07-10 DIAGNOSIS — I129 Hypertensive chronic kidney disease with stage 1 through stage 4 chronic kidney disease, or unspecified chronic kidney disease: Secondary | ICD-10-CM | POA: Insufficient documentation

## 2022-07-10 DIAGNOSIS — D696 Thrombocytopenia, unspecified: Secondary | ICD-10-CM | POA: Diagnosis not present

## 2022-07-10 DIAGNOSIS — N183 Chronic kidney disease, stage 3 unspecified: Secondary | ICD-10-CM | POA: Insufficient documentation

## 2022-07-10 NOTE — Progress Notes (Signed)
[address] [place], [place] [address]   CLINIC:  Medical Oncology/Hematology  PCP:  [name], [address] / [place] [address]  [phone]  REASON FOR VISIT:  Follow-up for multiple myeloma  PRIOR THERAPY: none  CURRENT THERAPY: surveillance  INTERVAL HISTORY:  [name], a 62 y.o. male, returns for routine follow-up for his multiple myeloma. He was last seen by me on 01/02/22.  He has since had a left AV fistula creation on 01/22/23 but had thrombosis failure prompting repeat left AV fistula creation on 06/17/22. He was also seen in the ED on 03/15/23 for left flank and hip pain. He had a CT which showed a nonobstructive left renal stone. He was started on Prednisone at that time.   He is scheduled for follow up with his nephrologist Dr. Ulice Bold tomorrow.  Today, he states that he is doing well overall. His appetite level is at 100%. His energy level is at 75%. He still has joint pain but recently developed hand pain/cramping. He still reports having numbness of the feet which is chronic and unchanged. He has noticed more chest congestion/fluid x1 month but his cough and shortness of breath are stable.   REVIEW OF SYSTEMS:  Review of Systems  Constitutional:  Negative for chills, fatigue and fever.  HENT:   Negative for lump/mass, mouth sores, nosebleeds, sore throat and trouble swallowing.   Respiratory:  Positive for cough and shortness of breath.   Cardiovascular:  Negative for chest pain, leg swelling and palpitations.  Gastrointestinal:  Positive for constipation and diarrhea. Negative for abdominal pain, nausea and vomiting.  Genitourinary:  Negative for bladder incontinence, difficulty urinating, dysuria, frequency, hematuria and nocturia.   Musculoskeletal:  Negative for arthralgias, back pain, flank pain, myalgias and neck pain.  Skin:  Negative for itching and rash.  Neurological:  Positive for numbness  (feet). Negative for dizziness and headaches.  Hematological:  Does not bruise/bleed easily.  Psychiatric/Behavioral:  Positive for sleep disturbance. Negative for depression and suicidal ideas. The patient is not nervous/anxious.   All other systems reviewed and are negative.   PAST MEDICAL/SURGICAL HISTORY:  Past Medical History:  Diagnosis Date   Abscess 04/2017   Scrotal   Anemia    Arteriosclerotic cardiovascular disease (ASCVD)    MI-2000s; stent to the proximal LAD and diagonal in 2001; stress nuclear in 2008-impaired exercise capacity, left ventricular dilatation, moderately to severely depressed EF, apical, inferior and anteroseptal scar   Arthritis    Atrial flutter (HCC)    Bence-Jones proteinuria 05/05/2011   Cellulitis of leg    both legs   Chronic diarrhea    Chronic kidney disease, stage 3, mod decreased GFR (HCC)    Creatinine of 1.84 in 06/2011 and 1.5 in 07/2011   Diabetes mellitus    Insulin   Dysrhythmia    AFlutter   GERD (gastroesophageal reflux disease)    Gout    Hyperlipidemia    Hypertension    Injection site reaction    Multiple myeloma 07/01/2011   Myocardial infarction ([place]) 2000   Obesity    Pedal edema    Venous insufficiency   Sleep apnea    uses cpap   Ulcer    Past Surgical History:  Procedure Laterality Date   ABSCESS DRAINAGE     Scrotal   AV FISTULA PLACEMENT Left 01/21/2022   Procedure: LEFT ARM ARTERIOVENOUS (AV) FISTULA CREATION;  Surgeon: Curt Jews  F, MD;  Location: [facility];  Service: Vascular;  Laterality: Left;   AV FISTULA PLACEMENT Left 06/17/2022   Procedure: LEFT ARM ARTERIOVENOUS (AV) FISTULA CREATION;  Surgeon: [name], MD;  Location: [facility];  Service: Vascular;  Laterality: Left;   BIOPSY  01/02/2012   Procedure: BIOPSY;  Surgeon: Rogene Houston, MD;  [facility];  Service: Endoscopy;  Laterality: N/A;   BONE MARROW BIOPSY  05/13/11   CARDIAC CATHETERIZATION     cardiac stent   CARDIOVERSION N/A  10/13/2012   Procedure: CARDIOVERSION;  Surgeon: Yehuda Savannah, MD;  Location: [facility];  Service: Cardiovascular;  Laterality: N/A;   CATARACT EXTRACTION W/PHACO Left 02/13/2014   Procedure: CATARACT EXTRACTION PHACO AND INTRAOCULAR LENS PLACEMENT ([place]);  Surgeon: Tonny Branch, MD;  Location: [facility];  Service: Ophthalmology;  Laterality: Left;  CDE:  7.67   CATARACT EXTRACTION W/PHACO Right 03/02/2014   Procedure: CATARACT EXTRACTION PHACO AND INTRAOCULAR LENS PLACEMENT RIGHT EYE CDE=16.81;  Surgeon: Tonny Branch, MD;  Location: [facility];  Service: Ophthalmology;  Laterality: Right;   COLONOSCOPY  11/28/2011   Procedure: COLONOSCOPY;  Surgeon: Rogene Houston, MD;  [facility];  Service: Endoscopy;  Laterality: N/A;  [place] Right 12/16/2017   Procedure: CYSTOSCOPY WITH RIGHT RETROGRADE PYELOGRAM AND RIGHT URETERAL STENT REMOVAL;  Surgeon: Cleon Gustin, MD;  Location: [facility];  Service: Urology;  Laterality: Right;   CYSTOSCOPY W/ URETERAL STENT PLACEMENT Right 07/31/2017   Procedure: CYSTOSCOPY WITH RETROGRADE PYELOGRAM/URETERAL STENT PLACEMENT;  Surgeon: Cleon Gustin, MD;  [facility];  Service: Urology;  Laterality: Right;   ESOPHAGOGASTRODUODENOSCOPY  01/02/2012   Procedure: ESOPHAGOGASTRODUODENOSCOPY (EGD);  Surgeon: Rogene Houston, MD;  [facility];  Service: Endoscopy;  Laterality: N/A;  100   ESOPHAGOGASTRODUODENOSCOPY N/A 09/20/2012   Procedure: ESOPHAGOGASTRODUODENOSCOPY (EGD);  Surgeon: Rogene Houston, MD;  [facility];  Service: Endoscopy;  Laterality: N/A;   EUS N/A 10/07/2012   Procedure: UPPER ENDOSCOPIC ULTRASOUND (EUS) LINEAR;  Surgeon: Milus Banister, MD;  [facility];  Service: Endoscopy;  Laterality: N/A;   INCISION AND DRAINAGE ABSCESS N/A 04/14/2017   Procedure: INCISION AND DRAINAGE ABSCESS;  Surgeon: Ceasar Mons, MD;  [facility];  Service: Urology;  Laterality: N/A;   INCISION  AND DRAINAGE ABSCESS ANAL     IRRIGATION AND DEBRIDEMENT ABSCESS N/A 04/15/2017   Procedure: DEBRIDEMENT SCROTAL WOUND AND DRESSING CHANGE;  Surgeon: Ceasar Mons, MD;  [facility];  Service: Urology;  Laterality: N/A;   IRRIGATION AND DEBRIDEMENT ABSCESS N/A 04/17/2017   Procedure: IRRIGATION AND DEBRIDEMENT ABSCESS;  Surgeon: Ceasar Mons, MD;  [facility];  Service: Urology;  Laterality: N/A;  RM 3   LAPAROSCOPIC GASTRIC BANDING  2006   has been removed   OSTECTOMY Right 04/08/2017   Procedure: OSTECTOMY RIGHT GREAT TOE;  Surgeon: Caprice Beaver, DPM;  Location: [facility];  Service: Podiatry;  Laterality: Right;   PORT-A-CATH REMOVAL Left 12/07/2012   Procedure: REMOVAL PORT-A-CATH;  Surgeon: Scherry Ran, MD;  Location: [facility];  Service: General;  Laterality: Left;   PORT-A-CATH REMOVAL N/A 10/02/2017   Procedure: MINOR REMOVAL PORT-A-CATH AT BEDSIDE;  Surgeon: Donnie Mesa, MD;  Location: [place] [facility];  Service: General;  Laterality: N/A;   PORTACATH PLACEMENT  07/07/2011   Procedure: INSERTION PORT-A-CATH;  Surgeon: Scherry Ran, MD;  [facility]: [facility];  Service: General;  Laterality: N/A;  PORTACATH PLACEMENT N/A 12/07/2012   Procedure: INSERTION PORT-A-CATH;  Surgeon: Scherry Ran, MD;  Location: [facility];  Service: General;  Laterality: N/A;  Attempted portacath placement on left and right side   URETEROSCOPY Right 12/16/2017   Procedure: DIAGNOSTIC RIGHT URETEROSCOPY;  Surgeon: Cleon Gustin, MD;  [facility];  Service: Urology;  Laterality: Right;   WOUND DEBRIDEMENT Right 04/08/2017   Procedure: EXCISION ULCERATION RIGHT GREAT TOE;  Surgeon: Caprice Beaver, DPM;  Location: [facility];  Service: Podiatry;  Laterality: Right;   WRIST SURGERY     Left; removal of bone fragment    SOCIAL HISTORY:  Social History   Socioeconomic History   Marital status: Married    Spouse name: Not on file   Number of children: Not on file   Years  of education: 16   Highest education level: Not on file  Occupational History   Occupation: Nurse, learning disability   Occupation: Optometrist: [employer]  Tobacco Use   Smoking status: Former    Packs/day: 0.25    Years: 1.00    Total pack years: 0.25    Types: Cigarettes, Cigars    Quit date: 05/17/2001    Years since quitting: 21.1    Passive exposure: Never   Smokeless tobacco: Never  Vaping Use   Vaping Use: Never used  Substance and Sexual Activity   Alcohol use: No    Alcohol/week: 0.0 standard drinks of alcohol   Drug use: No   Sexual activity: Yes    Birth control/protection: None  Other Topics Concern   Not on file  Social History Narrative   Not on file   Social Determinants of Health   Financial Resource Strain: Not on file  Food Insecurity: Not on file  Transportation Needs: Not on file  Physical Activity: Not on file  Stress: Not on file  Social Connections: Not on file  Intimate Partner Violence: Not on file    FAMILY HISTORY:  Family History  Problem Relation Age of Onset   Heart disease Mother    Cancer Mother    Diabetes Father    Arthritis Other    Anesthesia problems Neg Hx    Hypotension Neg Hx    Malignant hyperthermia Neg Hx    Pseudochol deficiency Neg Hx     CURRENT MEDICATIONS:  Current Outpatient Medications  Medication Sig Dispense Refill   acetaminophen (TYLENOL) 500 MG tablet Take 1,000 mg by mouth every 6 (six) hours as needed for moderate pain.     acyclovir (ZOVIRAX) 400 MG tablet Take 1 tablet (400 mg total) by mouth every morning. 30 tablet 5   allopurinol (ZYLOPRIM) 300 MG tablet TAKE ONE TABLET BY MOUTH ONCE DAILY. 30 tablet 2   atorvastatin (LIPITOR) 80 MG tablet TAKE ONE TABLET BY MOUTH AT BEDTIME. 90 tablet 3   Blood Glucose Monitoring Suppl (ACCU-CHEK GUIDE) w/Device KIT USE TO CHECK BLOOD SUGARSITWICE DAILY.     calcitRIOL (ROCALTROL) 0.25 MCG capsule Take 0.25 mcg by mouth 3 (three) times a  week. Monday, Wednesday and Friday     Calcium Carb-Cholecalciferol (CALCIUM 600/VITAMIN D PO) Take 1 tablet by mouth in the morning and at bedtime.     cholecalciferol (VITAMIN D3) 25 MCG (1000 UNIT) tablet Take 1,000 Units by mouth daily.     CONTOUR NEXT TEST test strip      CREON 36000-114000 units CPEP capsule Take 36,000 Units by mouth 2 (two) times daily as needed (  Depending on meal).     dicyclomine (BENTYL) 20 MG tablet TAKE 1 TABLET BY MOUTH BEFORE MEALS 3 TIMES DAILY. 90 tablet 2   Eluxadoline 100 MG TABS Take 100 mg by mouth 2 (two) times daily with a meal.     ferrous sulfate 325 (65 FE) MG EC tablet Take 325 mg by mouth every other day.     gabapentin (NEURONTIN) 600 MG tablet TAKE 1 TABLET BY MOUTH ONCE A DAY. (Patient taking differently: Take 300-600 mg by mouth See admin instructions. 600 mg in the morning, 300 mg at bedtime) 30 tablet 0   hydrALAZINE (APRESOLINE) 100 MG tablet TAKE 1 TABLET BY MOUTH THREE TIMES A DAY. 270 tablet 2   insulin aspart (NOVOLOG) 100 UNIT/ML injection Inject 1-3 Units into the skin 2 (two) times daily as needed for high blood sugar. Sliding scale over 150= 1 unit, 200= 2 units,250= 3 units     INSULIN SYRINGE .5CC/29G 29G X 1/2" 0.5 ML MISC      isosorbide mononitrate (IMDUR) 30 MG 24 hr tablet Take 30 mg by mouth daily.     LANTUS SOLOSTAR 100 UNIT/ML Solostar Pen SMARTSIG:10 Unit(s) SUB-Q Every Morning     loratadine (CLARITIN) 10 MG tablet Take 10 mg by mouth every morning.     Magnesium Cl-Calcium Carbonate (SLOW-MAG PO) Take 1 tablet by mouth every morning.     Microlet Lancets MISC      Multiple Vitamins-Minerals (MULTIVITAMINS THER. W/MINERALS) TABS Take 1 tablet by mouth daily.     oxyCODONE-acetaminophen (PERCOCET) 5-325 MG tablet Take 1 tablet by mouth every 6 (six) hours as needed for severe pain. 8 tablet 0   pantoprazole (PROTONIX) 40 MG tablet Take 40 mg by mouth daily.     Rivaroxaban (XARELTO) 15 MG TABS tablet Take 1 tablet (15 mg  total) by mouth daily with supper. 90 tablet 3   spironolactone (ALDACTONE) 25 MG tablet Take 25 mg by mouth daily.     SURE COMFORT PEN NEEDLES 29G X 12.7MM MISC USE AS DIRECTED FOURCTIMES DAILY.     torsemide (DEMADEX) 20 MG tablet 60 mg in the morning, 20 mg in the evening 135 tablet 3   nitroGLYCERIN (NITROSTAT) 0.4 MG SL tablet DISSOLVE 1 TABLET UNDER TONGUE EVERY 5 MINUTES UP TO 15 MIN FOR CHESTPAIN. IF NO RELIEF CALL 911. (Patient not taking: Reported on 07/10/2022) 25 tablet 0   No current facility-administered medications for this visit.   Facility-Administered Medications Ordered in Other Visits  Medication Dose Route Frequency Provider Last Rate Last Admin   sodium chloride 0.9 % injection 10 mL  10 mL Intravenous Once Farrel Gobble, MD        ALLERGIES:  Allergies  Allergen Reactions   Beef-Derived Products Diarrhea    Gout   Pork-Derived Products Diarrhea    Gout    Other Dermatitis    Band aids NO ADHESIVES      PHYSICAL EXAM:  Performance status (ECOG): 1 - Symptomatic but completely ambulatory  Vitals:   07/10/22 1031  BP: 127/71  Pulse: (!) 56  Resp: 16  Temp: 98.2 F (36.8 C)  SpO2: 99%    Wt Readings from Last 3 Encounters:  06/04/22 (!) 165.7 kg (365 lb 3.2 oz)  03/21/22 (!) 164.2 kg (362 lb)  03/14/22 (!) 160.2 kg (353 lb 4 oz)   Physical Exam Vitals and nursing note reviewed. Exam conducted with a chaperone present.  Constitutional:      Appearance: Normal appearance.  Cardiovascular:     Rate and Rhythm: Normal rate and regular rhythm.     Pulses: Normal pulses.     Heart sounds: Normal heart sounds.  Pulmonary:     Effort: Pulmonary effort is normal.     Breath sounds: Normal breath sounds.  Abdominal:     Palpations: Abdomen is soft. There is no hepatomegaly, splenomegaly or mass.     Tenderness: There is no abdominal tenderness.  Lymphadenopathy:     Cervical: No cervical adenopathy.     Right cervical: No superficial cervical  adenopathy.    Left cervical: No superficial cervical adenopathy.  Neurological:     General: No focal deficit present.     Mental Status: He is alert and oriented to person, place, and time.  Psychiatric:        Mood and Affect: Mood normal.        Behavior: Behavior normal.     LABORATORY DATA:  I have reviewed the labs as listed.     Latest Ref Rng & Units 07/09/2022    9:41 AM 06/25/2022   11:30 AM 06/17/2022    8:15 AM  CBC  Hemoglobin 13.0 - 17.0 g/dL 11.8  9.7  10.5   Hematocrit 39.0 - 52.0 %   33.0       Latest Ref Rng & Units 06/17/2022    8:15 AM 03/14/2022   11:40 AM 01/21/2022    7:20 AM  CMP  Glucose 70 - 99 mg/dL 154  139  117   BUN 8 - 23 mg/dL 96  96  95   Creatinine 0.61 - 1.24 mg/dL 4.12  4.27  4.80   Sodium 135 - 145 mmol/L 141  140  143   Potassium 3.5 - 5.1 mmol/L 4.1  3.9  3.9   Chloride 98 - 111 mmol/L 109  104  105   CO2 22 - 32 mmol/L 19  26    Calcium 8.9 - 10.3 mg/dL 8.4  9.2    Total Protein 6.5 - 8.1 g/dL  6.8    Total Bilirubin 0.3 - 1.2 mg/dL  0.6    Alkaline Phos 38 - 126 U/L  82    AST 15 - 41 U/L  33    ALT 0 - 44 U/L  31        Component Value Date/Time   RBC 3.35 (L) 03/14/2022 1140   MCV 100.9 (H) 03/14/2022 1140   MCH 32.8 03/14/2022 1140   MCHC 32.5 03/14/2022 1140   RDW 14.2 03/14/2022 1140   LYMPHSABS 2.3 12/13/2019 1434   MONOABS 0.6 12/13/2019 1434   EOSABS 0.2 12/13/2019 1434   BASOSABS 0.0 12/13/2019 1434    DIAGNOSTIC IMAGING:  I have independently reviewed the scans and discussed with the patient. No results found.   ASSESSMENT:  1.  Multiple myeloma: -Immunofixation has been negative since 2017.   2.  Macrocytic anemia: -He has mild anemia from CKD and relative iron deficiency. -Last Feraheme was on 12/14/2017. -He is taking iron tablet every other day.   PLAN:  1.  Multiple myeloma: - He does not report any new onset bone pains.  No new infections. - We have reviewed lab results from [facility] (07/03/2022):  Free light chain ratio is normal even though kappa and lambda light chains are elevated.  Immunofixation was negative.  SPEP was negative. - Will repeat myeloma panel in 6 months.   2.  Normocytic anemia: - Hemoglobin is 10.7 MCV is 99. -  Continue follow-up with [name] for intermittent Venofer and Retacrit. - He has fistula placed in the left forearm for impending dialysis.  3.  Mild to moderate thrombocytopenia: - He had previously low platelet count with platelet aggregates on the sample.  It was thought to be pseudothrombocytopenia from clumping. - Latest CBC shows platelet count at 178, previously 129.  Orders placed this encounter:  Orders Placed This Encounter  Procedures   CBC with Differential   Comprehensive metabolic panel   Lactate dehydrogenase   Immunofixation electrophoresis   Kappa/lambda light chains   Protein electrophoresis, serum    I,Alexis Herring,acting as a scribe for Derek Jack, MD.,have documented all relevant documentation on the behalf of Derek Jack, MD,as directed by  Derek Jack, MD while in the presence of Derek Jack, MD.  I, Derek Jack MD, have reviewed the above documentation for accuracy and completeness, and I agree with the above.   Derek Jack, MD [place] [phone]

## 2022-07-10 NOTE — Patient Instructions (Addendum)
[place] at [facility] Discharge Instructions   You were seen and examined today by [name].  He reviewed the results of your lab work. Your light chains ratio is normal. Your m-spike is negative. Your immunofixation is also normal.   Return as scheduled in 6 months.    Thank you for choosing [place] at [facility] to provide your oncology and hematology care.  To afford each patient quality time with our provider, please arrive at least 15 minutes before your scheduled appointment time.   If you have a lab appointment with the [facility] please come in thru the Main Entrance and check in at the main information desk.  You need to re-schedule your appointment should you arrive 10 or more minutes late.  We strive to give you quality time with our providers, and arriving late affects you and other patients whose appointments are after yours.  Also, if you no show three or more times for appointments you may be dismissed from the clinic at the providers discretion.     Again, thank you for choosing [facility].  Our hope is that these requests will decrease the amount of time that you wait before being seen by our physicians.       _____________________________________________________________  Should you have questions after your visit to [facility], please contact our office at [phone] and follow the prompts.  Our office hours are 8:00 a.m. and 4:30 p.m. Monday - Friday.  Please note that voicemails left after 4:00 p.m. may not be returned until the following business day.  We are closed weekends and major holidays.  You do have access to a nurse 24-7, just call the main number to the clinic [phone] and do not press any options, hold on the line and a nurse will answer the phone.    For prescription refill requests, have your pharmacy contact our office and allow 72 hours.    Due to Covid, you  will need to wear a mask upon entering the hospital. If you do not have a mask, a mask will be given to you at the Main Entrance upon arrival. For doctor visits, patients may have 1 support person age 66 or older with them. For treatment visits, patients can not have anyone with them due to social distancing guidelines and our immunocompromised population.

## 2022-07-15 ENCOUNTER — Other Ambulatory Visit: Payer: Self-pay

## 2022-07-15 DIAGNOSIS — Z79899 Other long term (current) drug therapy: Secondary | ICD-10-CM

## 2022-07-18 ENCOUNTER — Other Ambulatory Visit: Payer: Self-pay

## 2022-07-18 DIAGNOSIS — Z79899 Other long term (current) drug therapy: Secondary | ICD-10-CM

## 2022-07-18 LAB — CBC
Hematocrit: 33.2 % — ABNORMAL LOW (ref 37.5–51.0)
Hemoglobin: 10.9 g/dL — ABNORMAL LOW (ref 13.0–17.7)
MCH: 32.6 pg (ref 26.6–33.0)
MCHC: 32.8 g/dL (ref 31.5–35.7)
MCV: 99 fL — ABNORMAL HIGH (ref 79–97)
Platelets: 145 10*3/uL — ABNORMAL LOW (ref 150–450)
RBC: 3.34 x10E6/uL — ABNORMAL LOW (ref 4.14–5.80)
RDW: 14.4 % (ref 11.6–15.4)
WBC: 6.8 10*3/uL (ref 3.4–10.8)

## 2022-07-18 LAB — MAGNESIUM: Magnesium: 2.1 mg/dL (ref 1.6–2.3)

## 2022-07-22 ENCOUNTER — Encounter: Payer: BC Managed Care – PPO | Admitting: Cardiology

## 2022-07-22 ENCOUNTER — Encounter: Payer: Self-pay | Admitting: Cardiology

## 2022-07-22 VITALS — BP 148/78 | HR 70 | Ht 74.0 in | Wt 383.4 lb

## 2022-07-22 DIAGNOSIS — I2510 Atherosclerotic heart disease of native coronary artery without angina pectoris: Secondary | ICD-10-CM | POA: Diagnosis not present

## 2022-07-22 DIAGNOSIS — Z79899 Other long term (current) drug therapy: Secondary | ICD-10-CM

## 2022-07-22 DIAGNOSIS — I4892 Unspecified atrial flutter: Secondary | ICD-10-CM | POA: Diagnosis not present

## 2022-07-22 DIAGNOSIS — I5022 Chronic systolic (congestive) heart failure: Secondary | ICD-10-CM

## 2022-07-22 DIAGNOSIS — I10 Essential (primary) hypertension: Secondary | ICD-10-CM | POA: Diagnosis not present

## 2022-07-22 MED ORDER — TORSEMIDE 20 MG PO TABS: ORAL_TABLET | ORAL | 6 refills | Status: AC

## 2022-07-23 ENCOUNTER — Encounter (HOSPITAL_COMMUNITY): Payer: BC Managed Care – PPO

## 2022-07-23 ENCOUNTER — Ambulatory Visit (INDEPENDENT_AMBULATORY_CARE_PROVIDER_SITE_OTHER): Payer: BLUE CROSS/BLUE SHIELD | Admitting: Family Medicine

## 2022-07-23 ENCOUNTER — Other Ambulatory Visit (HOSPITAL_COMMUNITY): Payer: Self-pay | Admitting: *Deleted

## 2022-07-30 ENCOUNTER — Encounter: Payer: BC Managed Care – PPO | Admitting: Vascular Surgery

## 2022-07-31 ENCOUNTER — Telehealth: Payer: Self-pay | Admitting: Cardiology

## 2022-07-31 NOTE — Telephone Encounter (Signed)
Dr. [name] called in from [facility] to speak to Dr. Harl Bowie. Informed him he is rounding in [place], will send him a message to c/b

## 2022-08-01 NOTE — Patient Instructions (Signed)
Medication Instructions:  Increase Torsemide to 91m every morning & 443mevery evening x 2 days, then back to 6078mm / 42m36m with the addition of taking 42mg65mneeded for swelling or shortness of breath  Continue all other medications.     Labwork: BMET, BNP - 1 week  Office will contact with results via phone, letter or mychart.     Testing/Procedures: none  Follow-Up: 3 months   Any Other Special Instructions Will Be Listed Below (If Applicable).   If you need a refill on your cardiac medications before your next appointment, please call your pharmacy.

## 2022-08-01 NOTE — Progress Notes (Signed)
Clinical Summary [name] is a 62 y.o.male seen today for follow up of the following medical problems.    1. CAD - prior stents as reported below - no recent chest pains     2. Chronic systolic HF - 0000000 echo LVEF 35-40% - 2015 LVEF 25-30% 2017 LVEF 35-40%   - 07/2019 ehco LVEF 25-30%, grade II diastolic dysfxn. -Limited ACE inhibitor's or angiotensin receptor blockers due to advanced chronic kidney disease - **off beta blocker due to bradycardia from prior Dr Raliegh Ip notes.  - GFR <20, not on SGLT2i      10/2020 echo LVEF 30-35%, grade II dd   07/2021 admission with volume overload - 07/2021 echo: LVE 35-40%, normal RV        -no recent edema. Some SOB at times, not consistent.  - home weights [phone], trending up over the last few days - reports sedentary lifestyle due to foot ulcer,  - taking torsemide 39m in AM and 259min PM - last Cr 4.12 - intermittent cramps at times   2.Anemia - he is on epogen per renal     3. Aflutter - no recent palpitations       4. CKD IV - followed by Dr BhTheador Hawthorne/2023 fistula placed, close monitoring at this point in case HD becomes indicated       5. OSA - compliant with cpap    6. Multiple myeloma - being monitored      7. HTN - 10/02/21 neprhology started aldactone     - home bp's 120s-130s/70s-80s, checks bp about 3 times a week         Works at [employer] home, family business [employer]rd [employer]  Semi retired, just sold business. Will retire Nov 9,2023 Past Medical History:  Diagnosis Date   Abscess 04/2017   Scrotal   Anemia    Arteriosclerotic cardiovascular disease (ASCVD)    MI-2000s; stent to the proximal LAD and diagonal in 2001; stress nuclear in 2008-impaired exercise capacity, left ventricular dilatation, moderately to severely depressed EF, apical, inferior and anteroseptal scar   Arthritis    Atrial flutter (HCC)    Bence-Jones  proteinuria 05/05/2011   Cellulitis of leg    both legs   Chronic diarrhea    Chronic kidney disease, stage 3, mod decreased GFR (HCC)    Creatinine of 1.84 in 06/2011 and 1.5 in 07/2011   Diabetes mellitus    Insulin   Dysrhythmia    AFlutter   GERD (gastroesophageal reflux disease)    Gout    Hyperlipidemia    Hypertension    Injection site reaction    Multiple myeloma 07/01/2011   Myocardial infarction ([facility]2000   Obesity    Pedal edema    Venous insufficiency   Sleep apnea    uses cpap   Ulcer      Allergies  Allergen Reactions   Beef-Derived Products Diarrhea    Gout   Pork-Derived Products Diarrhea    Gout    Other Dermatitis    Band aids NO ADHESIVES       Current Outpatient Medications  Medication Sig Dispense Refill   acetaminophen (TYLENOL) 500 MG tablet Take 1,000 mg by mouth every 6 (six) hours as needed for moderate pain.     acyclovir (ZOVIRAX) 400 MG tablet Take 1 tablet (400 mg total) by mouth every morning. 30 tablet 5   allopurinol (ZYLOPRIM)  300 MG tablet TAKE ONE TABLET BY MOUTH ONCE DAILY. 30 tablet 2   atorvastatin (LIPITOR) 80 MG tablet TAKE ONE TABLET BY MOUTH AT BEDTIME. 90 tablet 3   Blood Glucose Monitoring Suppl (ACCU-CHEK GUIDE) w/Device KIT USE TO CHECK BLOOD SUGARSITWICE DAILY.     calcitRIOL (ROCALTROL) 0.25 MCG capsule Take 0.25 mcg by mouth 3 (three) times a week. Monday, Wednesday and Friday     Calcium Carb-Cholecalciferol (CALCIUM 600/VITAMIN D PO) Take 1 tablet by mouth in the morning and at bedtime.     cholecalciferol (VITAMIN D3) 25 MCG (1000 UNIT) tablet Take 1,000 Units by mouth daily.     CONTOUR NEXT TEST test strip      CREON 36000-114000 units CPEP capsule Take 36,000 Units by mouth 2 (two) times daily as needed (Depending on meal).     dicyclomine (BENTYL) 20 MG tablet TAKE 1 TABLET BY MOUTH BEFORE MEALS 3 TIMES DAILY. 90 tablet 2   Eluxadoline 100 MG TABS Take 100 mg by mouth 2 (two) times daily with a meal.     ferrous  sulfate 325 (65 FE) MG EC tablet Take 325 mg by mouth every other day.     gabapentin (NEURONTIN) 600 MG tablet TAKE 1 TABLET BY MOUTH ONCE A DAY. (Patient taking differently: Take 300-600 mg by mouth See admin instructions. 600 mg in the morning, 300 mg at bedtime) 30 tablet 0   hydrALAZINE (APRESOLINE) 100 MG tablet TAKE 1 TABLET BY MOUTH THREE TIMES A DAY. 270 tablet 2   insulin aspart (NOVOLOG) 100 UNIT/ML injection Inject 1-3 Units into the skin 2 (two) times daily as needed for high blood sugar. Sliding scale over 150= 1 unit, 200= 2 units,250= 3 units     INSULIN SYRINGE .5CC/29G 29G X 1/2" 0.5 ML MISC      isosorbide mononitrate (IMDUR) 30 MG 24 hr tablet Take 30 mg by mouth daily.     LANTUS SOLOSTAR 100 UNIT/ML Solostar Pen SMARTSIG:10 Unit(s) SUB-Q Every Morning     loratadine (CLARITIN) 10 MG tablet Take 10 mg by mouth every morning.     Magnesium Cl-Calcium Carbonate (SLOW-MAG PO) Take 1 tablet by mouth every morning.     Microlet Lancets MISC      Multiple Vitamins-Minerals (MULTIVITAMINS THER. W/MINERALS) TABS Take 1 tablet by mouth daily.     nitroGLYCERIN (NITROSTAT) 0.4 MG SL tablet DISSOLVE 1 TABLET UNDER TONGUE EVERY 5 MINUTES UP TO 15 MIN FOR CHESTPAIN. IF NO RELIEF CALL 911. (Patient not taking: Reported on 07/10/2022) 25 tablet 0   oxyCODONE-acetaminophen (PERCOCET) 5-325 MG tablet Take 1 tablet by mouth every 6 (six) hours as needed for severe pain. 8 tablet 0   pantoprazole (PROTONIX) 40 MG tablet Take 40 mg by mouth daily.     Rivaroxaban (XARELTO) 15 MG TABS tablet Take 1 tablet (15 mg total) by mouth daily with supper. 90 tablet 3   spironolactone (ALDACTONE) 25 MG tablet Take 25 mg by mouth daily.     SURE COMFORT PEN NEEDLES 29G X 12.7MM MISC USE AS DIRECTED FOURCTIMES DAILY.     torsemide (DEMADEX) 20 MG tablet 60 mg in the morning, 20 mg in the evening 135 tablet 3   No current facility-administered medications for this visit.   Facility-Administered Medications  Ordered in Other Visits  Medication Dose Route Frequency Provider Last Rate Last Admin   sodium chloride 0.9 % injection 10 mL  10 mL Intravenous Once Farrel Gobble, MD  Past Surgical History:  Procedure Laterality Date   ABSCESS DRAINAGE     Scrotal   AV FISTULA PLACEMENT Left 01/21/2022   Procedure: LEFT ARM ARTERIOVENOUS (AV) FISTULA CREATION;  Surgeon: [name], MD;  Location: [facility];  Service: Vascular;  Laterality: Left;   AV FISTULA PLACEMENT Left 06/17/2022   Procedure: LEFT ARM ARTERIOVENOUS (AV) FISTULA CREATION;  Surgeon: [name], MD;  Location: [facility];  Service: Vascular;  Laterality: Left;   BIOPSY  01/02/2012   Procedure: BIOPSY;  Surgeon: Rogene Houston, MD;  [facility];  Service: Endoscopy;  Laterality: N/A;   BONE MARROW BIOPSY  05/13/11   CARDIAC CATHETERIZATION     cardiac stent   CARDIOVERSION N/A 10/13/2012   Procedure: CARDIOVERSION;  Surgeon: Yehuda Savannah, MD;  Location: [facility];  Service: Cardiovascular;  Laterality: N/A;   CATARACT EXTRACTION W/PHACO Left 02/13/2014   Procedure: CATARACT EXTRACTION PHACO AND INTRAOCULAR LENS PLACEMENT ([place]);  Surgeon: Tonny , MD;  Location: [facility];  Service: Ophthalmology;  Laterality: Left;  CDE:  7.67   CATARACT EXTRACTION W/PHACO Right 03/02/2014   Procedure: CATARACT EXTRACTION PHACO AND INTRAOCULAR LENS PLACEMENT RIGHT EYE CDE=16.81;  Surgeon: Tonny , MD;  Location: [facility];  Service: Ophthalmology;  Laterality: Right;   COLONOSCOPY  11/28/2011   Procedure: COLONOSCOPY;  Surgeon: Rogene Houston, MD;  [facility];  Service: Endoscopy;  Laterality: N/A;  [place] Right 12/16/2017   Procedure: CYSTOSCOPY WITH RIGHT RETROGRADE PYELOGRAM AND RIGHT URETERAL STENT REMOVAL;  Surgeon: Cleon Gustin, MD;  Location: [facility];  Service: Urology;  Laterality: Right;   CYSTOSCOPY W/ URETERAL STENT PLACEMENT Right 07/31/2017   Procedure: CYSTOSCOPY WITH RETROGRADE  PYELOGRAM/URETERAL STENT PLACEMENT;  Surgeon: Cleon Gustin, MD;  [facility]: [facility];  Service: Urology;  Laterality: Right;   ESOPHAGOGASTRODUODENOSCOPY  01/02/2012   Procedure: ESOPHAGOGASTRODUODENOSCOPY (EGD);  Surgeon: Rogene Houston, MD;  [facility];  Service: Endoscopy;  Laterality: N/A;  100   ESOPHAGOGASTRODUODENOSCOPY N/A 09/20/2012   Procedure: ESOPHAGOGASTRODUODENOSCOPY (EGD);  Surgeon: Rogene Houston, MD;  [facility];  Service: Endoscopy;  Laterality: N/A;   EUS N/A 10/07/2012   Procedure: UPPER ENDOSCOPIC ULTRASOUND (EUS) LINEAR;  Surgeon: Milus Banister, MD;  [facility];  Service: Endoscopy;  Laterality: N/A;   INCISION AND DRAINAGE ABSCESS N/A 04/14/2017   Procedure: INCISION AND DRAINAGE ABSCESS;  Surgeon: Ceasar Mons, MD;  [facility]: [facility];  Service: Urology;  Laterality: N/A;   INCISION AND DRAINAGE ABSCESS ANAL     IRRIGATION AND DEBRIDEMENT ABSCESS N/A 04/15/2017   Procedure: DEBRIDEMENT SCROTAL WOUND AND DRESSING CHANGE;  Surgeon: Ceasar Mons, MD;  Location: [facility];  Service: Urology;  Laterality: N/A;   IRRIGATION AND DEBRIDEMENT ABSCESS N/A 04/17/2017   Procedure: IRRIGATION AND DEBRIDEMENT ABSCESS;  Surgeon: Ceasar Mons, MD;  [facility];  Service: Urology;  Laterality: N/A;  RM 3   LAPAROSCOPIC GASTRIC BANDING  2006   has been removed   OSTECTOMY Right 04/08/2017   Procedure: OSTECTOMY RIGHT GREAT TOE;  Surgeon: Caprice Beaver, DPM;  Location: [facility];  Service: Podiatry;  Laterality: Right;   PORT-A-CATH REMOVAL Left 12/07/2012   Procedure: REMOVAL PORT-A-CATH;  Surgeon: Scherry Ran, MD;  Location: [facility];  Service: General;  Laterality: Left;   PORT-A-CATH REMOVAL N/A 10/02/2017   Procedure: MINOR REMOVAL PORT-A-CATH AT BEDSIDE;  Surgeon: Donnie Mesa, MD;  Location: [facility];  Service: General;  Laterality: N/A;   PORTACATH PLACEMENT  07/07/2011   Procedure: INSERTION  PORT-A-CATH;  Surgeon: Scherry Ran, MD;  Location: [facility];  Service: General;  Laterality: N/A;   PORTACATH PLACEMENT N/A 12/07/2012   Procedure: INSERTION PORT-A-CATH;  Surgeon: Scherry Ran, MD;  Location: [facility];  Service: General;  Laterality: N/A;  Attempted portacath placement on left and right side   URETEROSCOPY Right 12/16/2017   Procedure: DIAGNOSTIC RIGHT URETEROSCOPY;  Surgeon: Cleon Gustin, MD;  Location: [facility];  Service: Urology;  Laterality: Right;   WOUND DEBRIDEMENT Right 04/08/2017   Procedure: EXCISION ULCERATION RIGHT GREAT TOE;  Surgeon: Caprice Beaver, DPM;  Location: [facility];  Service: Podiatry;  Laterality: Right;   WRIST SURGERY     Left; removal of bone fragment     Allergies  Allergen Reactions   Beef-Derived Products Diarrhea    Gout   Pork-Derived Products Diarrhea    Gout    Other Dermatitis    Band aids NO ADHESIVES        Family History  Problem Relation Age of Onset   Heart disease Mother    Cancer Mother    Diabetes Father    Arthritis Other    Anesthesia problems Neg Hx    Hypotension Neg Hx    Malignant hyperthermia Neg Hx    Pseudochol deficiency Neg Hx      Social History [name] reports that he quit smoking about 21 years ago. His smoking use included cigarettes and cigars. He has a 0.25 pack-year smoking history. He has never been exposed to tobacco smoke. He has never used smokeless tobacco. [name] reports no history of alcohol use.   Review of Systems CONSTITUTIONAL: No weight loss, fever, chills, weakness or fatigue.  HEENT: Eyes: No visual loss, blurred vision, double vision or yellow sclerae.No hearing loss, sneezing, congestion, runny nose or sore throat.  SKIN: No rash or itching.  CARDIOVASCULAR: per hpi RESPIRATORY: No shortness of breath, cough or sputum.  GASTROINTESTINAL: No anorexia, nausea, vomiting or diarrhea. No abdominal pain or blood.  GENITOURINARY: No burning on urination, no  polyuria NEUROLOGICAL: No headache, dizziness, syncope, paralysis, ataxia, numbness or tingling in the extremities. No change in bowel or bladder control.  MUSCULOSKELETAL: No muscle, back pain, joint pain or stiffness.  LYMPHATICS: No enlarged nodes. No history of splenectomy.  PSYCHIATRIC: No history of depression or anxiety.  ENDOCRINOLOGIC: No reports of sweating, cold or heat intolerance. No polyuria or polydipsia.  Marland Kitchen   Physical Examination Today's Vitals   August 07, 2022 1035  BP: (!) 148/82  Pulse: 70  SpO2: 97%  Weight: (!) 383 lb 6.4 oz (173.9 kg)  Height: 6' 2"$  (1.88 m)   Body mass index is 49.23 kg/m.  Gen: resting comfortably, no acute distress HEENT: no scleral icterus, pupils equal round and reactive, no palptable cervical adenopathy,  CV: RRR, no m/rg, no jvd Resp: Clear to auscultation bilaterally GI: abdomen is soft, non-tender, non-distended, normal bowel sounds, no hepatosplenomegaly MSK: extremities are warm, no edema.  Skin: warm, no rash Neuro:  no focal deficits Psych: appropriate affect   Diagnostic Studies  10/2020 echo IMPRESSIONS     1. Left ventricular ejection fraction, by estimation, is approximately 30  to 35%. The left ventricle has moderately decreased function. The left  ventricle demonstrates regional wall motion abnormalities (see scoring  diagram/findings for description).  The left ventricular internal cavity size was moderately dilated. There is  mild left ventricular hypertrophy.  Left ventricular diastolic parameters  are consistent with Grade II diastolic dysfunction (pseudonormalization).   2. Right ventricular systolic function is normal. The right ventricular  size is normal. Tricuspid regurgitation signal is inadequate for assessing  PA pressure.   3. Left atrial size was mildly dilated.   4. Right atrial size was mildly dilated.   5. The mitral valve is grossly normal. Mild mitral valve regurgitation.   6. The aortic valve is  tricuspid. Aortic valve regurgitation is not  visualized. There is a rounded echodensity seen at the aortic valve  coaptation point in the parasternal long axis view only. Although cannot  exclude vegetation, this could be visualized   calcified left coronary leaflet tip based on other views. If endocarditis  is suspected clinically, would suggest TEE.   7. The inferior vena cava is dilated in size with >50% respiratory  variability, suggesting right atrial pressure of 8 mmHg.    Assessment and Plan  1. Chronic systolic HF - several year history of chronic systolic HF, ICM - medical therapy limited. Bradycardia on beta blockers now off. Significant renal dysfunction limits ACE/ARB/ARNI consideration. Renal did recently add aldactone. . Not a candidate for farxiga/jardiance given GFR <20  - appears near euvolemic, I think his 20 lbs weight gain since 03/2022 is more related to caloric intake and inactivity. Mild uptrend within the last few days, he will take torsemide 12m in AM and 466min pm x 2 days, then back to 6073mn AM and 55m54m pm and can take additional 55mg95m.  - check bmet/bnp 1 weeks       2. Aflutter/acquired thrombophilia - not on av nodal agents due to prior bradycardia -no symptoms, continue curren tmeds     3. HTN - elevated here but home numbers at goal, continue current meds     4.CAD - no symptoms, continue current meds      JonatArnoldo Lenis.

## 2022-08-01 DEATH — deceased

## 2022-08-06 ENCOUNTER — Encounter (HOSPITAL_COMMUNITY): Payer: BC Managed Care – PPO

## 2022-08-06 ENCOUNTER — Ambulatory Visit (INDEPENDENT_AMBULATORY_CARE_PROVIDER_SITE_OTHER): Payer: BLUE CROSS/BLUE SHIELD | Admitting: Family Medicine

## 2022-08-06 ENCOUNTER — Encounter: Payer: BC Managed Care – PPO | Admitting: Vascular Surgery

## 2022-10-14 ENCOUNTER — Ambulatory Visit: Payer: BC Managed Care – PPO | Admitting: Cardiology

## 2023-01-08 ENCOUNTER — Ambulatory Visit: Payer: BC Managed Care – PPO | Admitting: Hematology
